# Patient Record
Sex: Female | Born: 1940 | Race: White | Hispanic: No | Marital: Married | Smoking: Never smoker
Health system: Southern US, Community
[De-identification: ages and names within clinical notes are randomized; demographics above are authoritative.]

## PROBLEM LIST (undated history)

## (undated) ENCOUNTER — Telehealth: Attending: Dermatology | Primary: Dermatology

## (undated) ENCOUNTER — Ambulatory Visit: Payer: MEDICARE

## (undated) ENCOUNTER — Encounter

## (undated) ENCOUNTER — Ambulatory Visit

## (undated) ENCOUNTER — Encounter: Attending: MOHS-Micrographic Surgery | Primary: MOHS-Micrographic Surgery

## (undated) ENCOUNTER — Telehealth

## (undated) ENCOUNTER — Encounter: Attending: Dermatology | Primary: Dermatology

## (undated) DIAGNOSIS — E785 Hyperlipidemia, unspecified: Secondary | ICD-10-CM

## (undated) DIAGNOSIS — I5032 Chronic diastolic (congestive) heart failure: Secondary | ICD-10-CM

## (undated) DIAGNOSIS — K449 Diaphragmatic hernia without obstruction or gangrene: Secondary | ICD-10-CM

## (undated) DIAGNOSIS — F329 Major depressive disorder, single episode, unspecified: Secondary | ICD-10-CM

## (undated) DIAGNOSIS — R0602 Shortness of breath: Secondary | ICD-10-CM

## (undated) DIAGNOSIS — N83209 Unspecified ovarian cyst, unspecified side: Secondary | ICD-10-CM

## (undated) DIAGNOSIS — J690 Pneumonitis due to inhalation of food and vomit: Secondary | ICD-10-CM

## (undated) DIAGNOSIS — K219 Gastro-esophageal reflux disease without esophagitis: Secondary | ICD-10-CM

## (undated) DIAGNOSIS — Z87442 Personal history of urinary calculi: Secondary | ICD-10-CM

## (undated) DIAGNOSIS — F32A Depression, unspecified: Secondary | ICD-10-CM

## (undated) DIAGNOSIS — K859 Acute pancreatitis without necrosis or infection, unspecified: Secondary | ICD-10-CM

## (undated) DIAGNOSIS — E169 Disorder of pancreatic internal secretion, unspecified: Secondary | ICD-10-CM

## (undated) DIAGNOSIS — K8020 Calculus of gallbladder without cholecystitis without obstruction: Secondary | ICD-10-CM

## (undated) DIAGNOSIS — I6789 Other cerebrovascular disease: Secondary | ICD-10-CM

## (undated) DIAGNOSIS — I10 Essential (primary) hypertension: Secondary | ICD-10-CM

## (undated) DIAGNOSIS — N200 Calculus of kidney: Secondary | ICD-10-CM

## (undated) DIAGNOSIS — J189 Pneumonia, unspecified organism: Secondary | ICD-10-CM

## (undated) DIAGNOSIS — M1990 Unspecified osteoarthritis, unspecified site: Secondary | ICD-10-CM

## (undated) DIAGNOSIS — J349 Unspecified disorder of nose and nasal sinuses: Secondary | ICD-10-CM

## (undated) DIAGNOSIS — T8859XA Other complications of anesthesia, initial encounter: Secondary | ICD-10-CM

## (undated) DIAGNOSIS — Z9889 Other specified postprocedural states: Secondary | ICD-10-CM

## (undated) DIAGNOSIS — I6501 Occlusion and stenosis of right vertebral artery: Secondary | ICD-10-CM

## (undated) DIAGNOSIS — E079 Disorder of thyroid, unspecified: Secondary | ICD-10-CM

## (undated) DIAGNOSIS — M25569 Pain in unspecified knee: Secondary | ICD-10-CM

## (undated) DIAGNOSIS — E039 Hypothyroidism, unspecified: Secondary | ICD-10-CM

## (undated) DIAGNOSIS — R112 Nausea with vomiting, unspecified: Secondary | ICD-10-CM

## (undated) DIAGNOSIS — R569 Unspecified convulsions: Secondary | ICD-10-CM

## (undated) DIAGNOSIS — M549 Dorsalgia, unspecified: Secondary | ICD-10-CM

## (undated) DIAGNOSIS — D649 Anemia, unspecified: Secondary | ICD-10-CM

## (undated) HISTORY — DX: Calculus of kidney: N20.0

## (undated) HISTORY — PX: CARPAL TUNNEL RELEASE: SHX101

## (undated) HISTORY — PX: NASAL SINUS SURGERY: SHX719

## (undated) HISTORY — DX: Unspecified convulsions: R56.9

## (undated) HISTORY — DX: Acute pancreatitis without necrosis or infection, unspecified: K85.90

## (undated) HISTORY — DX: Hyperlipidemia, unspecified: E78.5

## (undated) HISTORY — DX: Calculus of gallbladder without cholecystitis without obstruction: K80.20

## (undated) HISTORY — DX: Unspecified osteoarthritis, unspecified site: M19.90

## (undated) HISTORY — DX: Essential (primary) hypertension: I10

## (undated) HISTORY — PX: APPENDECTOMY: SHX54

## (undated) HISTORY — DX: Pneumonia, unspecified organism: J18.9

## (undated) HISTORY — DX: Disorder of thyroid, unspecified: E07.9

## (undated) HISTORY — PX: FOOT OSTEOTOMY: SHX957

## (undated) HISTORY — PX: EYE SURGERY: SHX253

## (undated) HISTORY — PX: CATARACT EXTRACTION W/ INTRAOCULAR LENS  IMPLANT, BILATERAL: SHX1307

## (undated) HISTORY — PX: HAND SURGERY: SHX662

## (undated) HISTORY — PX: WRIST GANGLION EXCISION: SUR520

## (undated) HISTORY — PX: ABDOMINAL HYSTERECTOMY: SHX81

---
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
## 1961-12-08 HISTORY — PX: PARTIAL HYSTERECTOMY: SHX80

## 1963-12-09 HISTORY — PX: APPENDECTOMY: SHX54

## 1963-12-09 HISTORY — PX: BILATERAL SALPINGOOPHORECTOMY: SHX1223

## 1999-09-03 ENCOUNTER — Ambulatory Visit (HOSPITAL_BASED_OUTPATIENT_CLINIC_OR_DEPARTMENT_OTHER): Admission: RE | Admit: 1999-09-03 | Discharge: 1999-09-03 | Payer: Self-pay | Admitting: Orthopedic Surgery

## 1999-12-26 ENCOUNTER — Encounter: Admission: RE | Admit: 1999-12-26 | Discharge: 1999-12-26 | Payer: Self-pay | Admitting: Otolaryngology

## 1999-12-26 ENCOUNTER — Encounter: Payer: Self-pay | Admitting: Otolaryngology

## 2000-03-16 ENCOUNTER — Other Ambulatory Visit: Admission: RE | Admit: 2000-03-16 | Discharge: 2000-03-16 | Payer: Self-pay | Admitting: Otolaryngology

## 2001-03-24 ENCOUNTER — Encounter: Admission: RE | Admit: 2001-03-24 | Discharge: 2001-03-24 | Payer: Self-pay | Admitting: Orthopedic Surgery

## 2001-03-24 ENCOUNTER — Encounter: Payer: Self-pay | Admitting: Orthopedic Surgery

## 2001-03-25 ENCOUNTER — Ambulatory Visit (HOSPITAL_BASED_OUTPATIENT_CLINIC_OR_DEPARTMENT_OTHER): Admission: RE | Admit: 2001-03-25 | Discharge: 2001-03-25 | Payer: Self-pay | Admitting: Orthopedic Surgery

## 2001-05-13 ENCOUNTER — Ambulatory Visit (HOSPITAL_BASED_OUTPATIENT_CLINIC_OR_DEPARTMENT_OTHER): Admission: RE | Admit: 2001-05-13 | Discharge: 2001-05-13 | Payer: Self-pay | Admitting: Orthopedic Surgery

## 2002-07-26 ENCOUNTER — Encounter: Payer: Self-pay | Admitting: Otolaryngology

## 2002-07-26 ENCOUNTER — Encounter: Admission: RE | Admit: 2002-07-26 | Discharge: 2002-07-26 | Payer: Self-pay | Admitting: Otolaryngology

## 2003-10-31 ENCOUNTER — Encounter: Admission: RE | Admit: 2003-10-31 | Discharge: 2003-10-31 | Payer: Self-pay | Admitting: Orthopedic Surgery

## 2004-01-05 ENCOUNTER — Encounter: Admission: RE | Admit: 2004-01-05 | Discharge: 2004-01-05 | Payer: Self-pay | Admitting: Otolaryngology

## 2004-02-14 ENCOUNTER — Ambulatory Visit (HOSPITAL_COMMUNITY): Admission: RE | Admit: 2004-02-14 | Discharge: 2004-02-14 | Payer: Self-pay | Admitting: Otolaryngology

## 2004-02-14 ENCOUNTER — Encounter (INDEPENDENT_AMBULATORY_CARE_PROVIDER_SITE_OTHER): Payer: Self-pay | Admitting: Specialist

## 2004-02-14 ENCOUNTER — Ambulatory Visit (HOSPITAL_BASED_OUTPATIENT_CLINIC_OR_DEPARTMENT_OTHER): Admission: RE | Admit: 2004-02-14 | Discharge: 2004-02-14 | Payer: Self-pay | Admitting: Otolaryngology

## 2005-08-27 ENCOUNTER — Ambulatory Visit: Payer: Self-pay | Admitting: Internal Medicine

## 2005-12-14 ENCOUNTER — Emergency Department (HOSPITAL_COMMUNITY): Admission: EM | Admit: 2005-12-14 | Discharge: 2005-12-14 | Payer: Self-pay | Admitting: Emergency Medicine

## 2006-01-19 ENCOUNTER — Ambulatory Visit: Payer: Self-pay | Admitting: Internal Medicine

## 2006-01-27 ENCOUNTER — Ambulatory Visit: Payer: Self-pay | Admitting: Internal Medicine

## 2006-01-29 ENCOUNTER — Ambulatory Visit (HOSPITAL_COMMUNITY): Admission: RE | Admit: 2006-01-29 | Discharge: 2006-01-29 | Payer: Self-pay | Admitting: Internal Medicine

## 2008-10-10 ENCOUNTER — Ambulatory Visit (HOSPITAL_BASED_OUTPATIENT_CLINIC_OR_DEPARTMENT_OTHER): Admission: RE | Admit: 2008-10-10 | Discharge: 2008-10-10 | Payer: Self-pay | Admitting: Orthopedic Surgery

## 2008-10-10 ENCOUNTER — Encounter (INDEPENDENT_AMBULATORY_CARE_PROVIDER_SITE_OTHER): Payer: Self-pay | Admitting: Orthopedic Surgery

## 2009-02-06 ENCOUNTER — Ambulatory Visit (HOSPITAL_BASED_OUTPATIENT_CLINIC_OR_DEPARTMENT_OTHER): Admission: RE | Admit: 2009-02-06 | Discharge: 2009-02-06 | Payer: Self-pay | Admitting: Orthopedic Surgery

## 2010-07-23 ENCOUNTER — Ambulatory Visit: Payer: Self-pay | Admitting: Internal Medicine

## 2010-10-16 ENCOUNTER — Ambulatory Visit (HOSPITAL_BASED_OUTPATIENT_CLINIC_OR_DEPARTMENT_OTHER): Admission: RE | Admit: 2010-10-16 | Discharge: 2010-10-16 | Payer: Self-pay | Admitting: Orthopedic Surgery

## 2010-10-16 ENCOUNTER — Emergency Department (HOSPITAL_COMMUNITY): Admission: EM | Admit: 2010-10-16 | Discharge: 2010-10-16 | Payer: Self-pay | Admitting: Emergency Medicine

## 2010-12-29 ENCOUNTER — Encounter: Payer: Self-pay | Admitting: Otolaryngology

## 2011-01-27 NOTE — Op Note (Signed)
[name], [place] NO.:  [id number]  MEDICAL RECORD NO.:  [id number]          PATIENT TYPE:  AMB  LOCATION:  DSC                          FACILITY:  [facility]  PHYSICIAN:  Cindee Salt, M.D.       DATE OF BIRTH:  1941-07-23  DATE OF PROCEDURE: DATE OF DISCHARGE:                              OPERATIVE REPORT   PREOPERATIVE DIAGNOSIS:  Pain, trapezial arthritis, right thumb.  POSTOPERATIVE DIAGNOSIS:  Pain trapezial arthritis, right thumb.  OPERATION:  Trapezial excision, partial trapezoid resection, abductor pollicis longus transfer, right thumb.  SURGEON:  Cindee Salt, MD  ASSISTANT:  Carolyne Fiscal, RN  ANESTHESIA:  Axillary, general.  ANESTHESIOLOGIST:  Dr. Jean Rosenthal.  HISTORY:  The patient is a 70 year old female with a history of pain in her right hand wrist and thumb.  This has not responded to conservative treatment.  X-rays reveal AP and trapezial arthritic appearance of her right hand.  She has elected to undergo suspensionplasty using APL with partial resection of the trapezoid.  She is aware of risks and complications including infection, recurrence, injury to arteries, nerves, and tendons, incomplete relief of symptoms, and dystrophy.  In the preoperative area, the patient is seen.  The extremity marked by both the patient and surgeon.  Antibiotic given.  PROCEDURE:  The patient was brought to the operating room where axillary block was carried out without difficulty.  A general anesthetic was also given.  She was prepped using ChloraPrep, supine position, right arm free.  A 3-minute dry time was allowed.  Time-out taken confirming the patient and procedure.  After adequate anesthesia was afforded, the limb was exsanguinated with an Esmarch bandage.  Tourniquet placed high on the arm was inflated to 250 mmHg.  A curvilinear incision was made over the radial aspect of the hand at base of the thumb, carried down through subcutaneous tissue.  Bleeders were  electrocauterized with bipolar. Radial nerve was identified and protected.  Dissection was carried down to the abductor pollicis longus and extensor pollicis brevis.  The radial artery was identified and protected.  The capsule was then opened to the [facility] STT joint.  A very significant synovitis of the STT joint was immediately encountered with a large amount of fluid which was evacuated.  The trapezium was then resected after cutting this to half with an osteotome.  This allowed visualization of the trapezial trapezoid joint.  The remainder of the trapezium was removed.  There was no cartilage present on the STT joint.  Minimal cartilage with large erosion was present on the distal articular surface of the trapezium. The specimen was sent to pathology.  The proximal aspect of the trapezoid was then resected using a rongeur.  X-rays confirmed adequate resection.  The most dorsal aspect of the abductor pollicis longus tendon was then harvested.  A separate incision made at the musculotendinous junction.  A Carroll tendon retriever was passed through the first dorsal compartment.  The portion of the APL was then harvested using a 32-gauge monofilament wire as a cheese cutter bringing this back to the proximal musculotendinous junction where the  tendon was transected.  Drill holes were then placed through the base of the thumb metacarpal and base of the index metacarpal.  This allowed the APL tendon to be transferred.  After irrigation and debridement of the joint, this was transferred first from a distal to proximal direction through the thumb metacarpal and then through the index from the radial to ulnar side.  A small incision was made to the ulnar side of the second metacarpal, allowing visualization.  A hemostat was then passed subperiosteally through the extensor carpi radialis longus and the tendon brought to the [facility] area of the thumb through the capsule at the juncture of the  trapezoid first metacarpal.  This was done taking care to protect the radial artery.  This was passed volar to it.  The tendon was then brought back through the drill hole in the base of the thumb metacarpal clamped.  The passage was then sutured with 4-0 FiberWire as it passed through the tunnel suturing the 2 limbs of the APL together at the proximal aspect of the thumb metacarpal.  This was then tied or sutured to the capsule dorsally at the carpometacarpal joint.  It was also sutured to the periosteum as it exited through the second metacarpal.  The wounds were copiously irrigated with saline.  The capsule was then closed and repaired with figure of eight 4-0 Vicryl sutures taking care to protect the radial artery.  The skin was then repaired with interrupted 4-0 Vicryl Rapide sutures.  X-rays confirmed the thumb adequately suspended from the index.  A sterile compressive dressing and thumb spica dorsal palmar splint applied.  On deflation of the tourniquet, all fingers immediately pinked and she was taken to the recovery room for observation in satisfactory condition.  She will be admitted for overnight stay and discharged when she is comfortable.  She will return in 1 week on Percocet.          ______________________________ Cindee Salt, M.D.     GK/MEDQ  D:  10/16/2010  T:  10/17/2010  Job:  [id number]  cc:   Dr. [name]  Electronically Signed by Cindee Salt M.D. on 01/27/2011 12:22:06 PM

## 2011-02-18 LAB — BASIC METABOLIC PANEL
BUN: 8 mg/dL (ref 6–23)
CO2: 28 mEq/L (ref 19–32)
Calcium: 10 mg/dL (ref 8.4–10.5)
Chloride: 104 mEq/L (ref 96–112)
Chloride: 106 mEq/L (ref 96–112)
GFR calc Af Amer: >60 mL/min (ref >60)
GFR calc Af Amer: >60 mL/min (ref >60)
GFR calc non Af Amer: 57 mL/min — ABNORMAL LOW (ref >60)
Glucose, Bld: 99 mg/dL (ref 70–99)

## 2011-02-18 LAB — DIFFERENTIAL
Basophils Absolute: 0 10*3/uL (ref 0.0–0.1)
Lymphocytes Relative: 7 % — ABNORMAL LOW (ref 12–46)
Lymphs Abs: 0.8 10*3/uL (ref 0.7–4.0)
Monocytes Absolute: 0.1 10*3/uL (ref 0.1–1.0)
Monocytes Relative: 1 % — ABNORMAL LOW (ref 3–12)
Neutro Abs: 9.9 10*3/uL — ABNORMAL HIGH (ref 1.7–7.7)
Neutrophils Relative %: 91 % — ABNORMAL HIGH (ref 43–77)

## 2011-02-18 LAB — CBC
MCH: 30.1 pg (ref 26.0–34.0)
MCHC: 34.4 g/dL (ref 30.0–36.0)
MCV: 87.4 fL (ref 78.0–100.0)
RDW: 15.8 % — ABNORMAL HIGH (ref 11.5–15.5)

## 2011-02-18 LAB — RAPID URINE DRUG SCREEN, HOSP PERFORMED
Barbiturates: NOT DETECTED
Benzodiazepines: POSITIVE — AB

## 2011-02-18 LAB — POCT HEMOGLOBIN-HEMACUE: Hemoglobin: 14.2 g/dL (ref 12.0–15.0)

## 2011-03-20 LAB — BASIC METABOLIC PANEL
Creatinine, Ser: 0.94 mg/dL (ref 0.4–1.2)
Potassium: 4.4 mEq/L (ref 3.5–5.1)
Sodium: 141 mEq/L (ref 135–145)

## 2011-04-11 ENCOUNTER — Emergency Department (HOSPITAL_COMMUNITY)
Admission: EM | Admit: 2011-04-11 | Discharge: 2011-04-11 | Disposition: A | Discharge status: Home / Self Care | Payer: Medicare Other | Attending: Emergency Medicine | Admitting: Emergency Medicine

## 2011-04-11 ENCOUNTER — Emergency Department (HOSPITAL_COMMUNITY): Payer: Medicare Other

## 2011-04-11 DIAGNOSIS — M545 Low back pain, unspecified: Secondary | ICD-10-CM | POA: Insufficient documentation

## 2011-04-11 DIAGNOSIS — M5380 Other specified dorsopathies, site unspecified: Secondary | ICD-10-CM | POA: Insufficient documentation

## 2011-04-11 DIAGNOSIS — M79609 Pain in unspecified limb: Secondary | ICD-10-CM | POA: Insufficient documentation

## 2011-04-11 LAB — URINALYSIS, ROUTINE W REFLEX MICROSCOPIC
Glucose, UA: NEGATIVE mg/dL
Ketones, ur: NEGATIVE mg/dL
Nitrite: NEGATIVE
Protein, ur: NEGATIVE mg/dL
pH: 5.5 (ref 5.0–8.0)

## 2011-04-22 NOTE — Op Note (Signed)
NAMEMACARENA, [name] NO.:  [id number]   MEDICAL RECORD NO.:  [id number]          PATIENT TYPE:  AMB   LOCATION:  DSC                          FACILITY:  [facility]   PHYSICIAN:  Cindee Salt, M.D.       DATE OF BIRTH:  29-Jun-1941   DATE OF PROCEDURE:  10/10/2008  DATE OF DISCHARGE:                               OPERATIVE REPORT   PREOPERATIVE DIAGNOSES:  1. Stenosing tenosynovitis, right thumb; stenosing tenosynovitis,      right middle finger.  2. Flexor sheath cyst, right middle finger.   POSTOPERATIVE DIAGNOSES:  1. Stenosing tenosynovitis, right thumb; stenosing tenosynovitis,      right middle finger.  2. Flexor sheath cyst, right middle finger.   OPERATIONS:  1. Release A1 pulley, right thumb; release A1 pulley, right middle      finger.  2. Excision flexor sheath cyst, right middle finger.   SURGEON:  Cindee Salt, MD   ASSISTANT:  Carolyne Fiscal, RN   ANESTHESIA:  Forearm-based IV regional.   ANESTHESIOLOGIST:  Burna Forts, MD   HISTORY:  The patient is a 70 year old female with a history of a mass  on the volar aspect of the A1 pulley of her right middle finger.  She  also shows triggering of the right middle and ring finger.  This has not  responded to conservative treatment.  She has elected to undergo  surgical decompression of each of the flexor sheath A1 pulley with  excision of the cyst on the middle finger.  She is aware of risks and  complications including infection; recurrence; injury to arteries,  nerves, and tendons; incomplete relief of symptoms; and dystrophy.  In  the preoperative area, the patient is seen, the extremity marked by both  the patient and surgeon, antibiotic given.   PROCEDURE:  The patient was brought to the operating room where a  forearm IV regional anesthetic was carried out without difficulty.  She  was prepped using DuraPrep in supine position, right arm free.  A time-  out was taken after identification of the areas  of incision.  Adequate  anesthesia performed.  A transverse incision was made over the A1 pulley  of the right thumb, carried down through subcutaneous tissue.  Bleeders  were electrocauterized with bipolar.  Neurovascular bundles were  identified and protected.  An incision was then made on the radial  aspect of the A1 pulley taking care to protect the oblique pulley.  Thumb was placed through a full range motion, no further triggering was  evident.  Wound was irrigated and skin closed with interrupted 5-0  Vicryl Rapide suture.  A separate oblique incision was made over the A1  pulley of the right middle finger, carried down through subcutaneous  tissue.  Neurovascular structures again protected.  Bleeders  electrocauterized with bipolar.  The A1 pulley was then released in its  radial aspect.  A small incision was made centrally in the A2 pulley.  Three distinct cysts were noted, these were each excised.  The large  wound was sent to pathology.  No further lesions were identified.  Neurovascular structures were protected throughout the procedure.  Wound  was copiously irrigated with saline and the skin closed with interrupted  5-0 Vicryl Rapide sutures.  A sterile compressive dressing was applied  with fingers free.  No splint  was attached.  The patient was taken to the recovery room for  observation in satisfactory condition.  At deflation of the tourniquet,  all fingers immediately pinked.  She will be discharged home, to return  to the [facility] of [place] in 1 week, on Vicodin.           ______________________________  Cindee Salt, M.D.     GK/MEDQ  D:  10/10/2008  T:  10/10/2008  Job:  [id number]   cc:   Leotis Shames D. Judithann Sheen, MD

## 2011-04-22 NOTE — Op Note (Signed)
NAMESANYE, [name] NO.:  [id number]   MEDICAL RECORD NO.:  [id number]          PATIENT TYPE:  AMB   LOCATION:  DSC                          FACILITY:  [facility]   PHYSICIAN:  Cindee Salt, M.D.       DATE OF BIRTH:  Apr 02, 1941   DATE OF PROCEDURE:  02/06/2009  DATE OF DISCHARGE:                               OPERATIVE REPORT   PREOPERATIVE DIAGNOSES:  1. Carpal tunnel syndrome left hand.  2. Carpometacarpal arthritis, right thumb.   POSTOPERATIVE DIAGNOSES:  1. Carpal tunnel syndrome left hand.  2. Carpometacarpal arthritis, right thumb.   OPERATION:  1. Decompression, left median nerve.  2. Injection of carpometacarpal joint, right thumb with Celestone and      Xylocaine.   SURGEON:  Cindee Salt, MD   ANESTHESIA:  Forearm based IV regional with sedation and local  infiltration with 0.25% Marcaine to the carpal tunnel.   ANESTHESIOLOGIST:  Sheldon Silvan, MD   HISTORY:  The patient is a 70 year old female with a history of carpal  tunnel syndrome, EMG nerve conductions positive which has not responded  to conservative treatment.  She also has degenerative arthritis,  carpometacarpal joint of her right thumb.  She is desirous of release of  the left carpal tunnel.  She is aware of risks and complications  including infection, recurrence, injury to arteries, nerves, tendons,  incomplete relief of symptoms, and dystrophy.  In the preoperative area,  the patient is seen, the extremity marked by both the patient and  surgeon, and antibiotic given.   PROCEDURE IN DETAIL:  The patient was brought to the operating room  where a forearm based IV regional anesthetic was carried out without  difficulty.  She was prepped using DuraPrep, supine position, left arm  free.  A time-out was taken confirming the patient and procedure.  A  longitudinal incision was made in the palm and carried down through  subcutaneous tissue.  Bleeders were electrocauterized.  Palmar fascia  was split.  Superficial palmar arch identified.  Flexor tendon of the  ring and little finger identified.  To the ulnar side of median nerve,  carpal retinaculum was incised with sharp dissection.  Right angle and  Sewall retractor were placed between the skin and forearm fascia.  The  fascia was released for approximately 1.5 cm proximal to the wrist  crease under direct vision.  Canal was explored.  Area of compression to  the nerve was apparent with an hourglass deformity.  No further lesions  were identified.  Wound was irrigated.  Skin closed with interrupted 5-0  Vicryl Rapide sutures.  A local infiltration with 0.25% Marcaine without  epinephrine was then given.  A sterile compressive dressing was applied.  The right thumb carpometacarpal joint was then prepped.  A injection to  the carpometacarpal joint was then given with 1% Xylocaine without  epinephrine and 0.5 mL of Celestone without complication.  A splint was  applied to the  left hand dressing.  The patient tolerated the procedure well and was  taken to the recovery room  for observation in satisfactory condition.  She will be discharged to home to return to the [facility] of  [place] in 1 week on Vicodin.           ______________________________  Cindee Salt, M.D.     GK/MEDQ  D:  02/06/2009  T:  02/06/2009  Job:  [id number]   cc:   Dr. Judithann Sheen

## 2011-04-25 NOTE — H&P (Signed)
[name], [name]                              ACCOUNT NO.:  [id number]   MEDICAL RECORD NO.:  [id number]                   PATIENT TYPE:  AMB   LOCATION:  DSC                                  FACILITY:  [facility]   PHYSICIAN:  Hermelinda Medicus, M.D.                DATE OF BIRTH:  08-22-1941   DATE OF ADMISSION:  02/14/2004  DATE OF DISCHARGE:                                HISTORY & PHYSICAL   This patient is a 70 year old female who, in the early 1990s, had severe  exposure to hydrocarbon chemical leak in [place] and had a considerable  amount of benign inflammatory tissue within her nose when I first saw her in  the late 1990s.  She had undergone sinus surgery in [place] which was  essentially endoscopic sinus surgery and gained some result.  I operated on  her in 1998 where we did bilateral maxillary sinus ostial enlargement and  right ethmoidectomy with turbinate reduction as she had considerable nasal  congestion, persistent sinusitis. She was taken care of by Dr. Shayne Alken  for her allergies to keep her respiratory status under control, and she did  reasonably well.  She, however, developed further sinus problems and allergy  problems, and in 2001 we did again surgery where we removed nasal polyps,  granulation tissue, and reduced her turbinates once again but did not remove  any mucous membrane.  She is still under Dr. Hortencia Pilar care but has now  discontinued this, and her sinus problems are recurring.  She is under the  care of the physicians in [place].  I have encouraged her to get back to  her allergy shots.  She is also under the care of Dr. Mechele Collin for reflux  esophagitis.  She has also had a complete cardiovascular evaluation.   She had a recent CT scan as she has had further aggravation of her sinuses  with drainage down the back of her throat with aggravation of her asthma  problems and has been seen under my care in 2004 alone.  Has been on  doxycycline.  She has  been on the decongestants of Prolix PD.  She does hae  a blood pressure problem, so we cannot use strong decongestants, and she has  been treated also with Nexium for her reflux esophagitis as we felt this may  be aggravating her respiratory problems.  This has improved somewhat, but  she continues to need antibiotics.  She continues to have nasal congestion  and recurrent polypoid change in the face of antibiotics, decongestants, and  steroid nasal sprays.  Her medications have been considerable which will be  listed in the following part of this dictation.  She has used primarily  Flonase, Clarinex, Singulair, and Accolate.   She has had recently a CT scan done on January 05, 2004, showing bilateral  ethmoid sinusitis with a left maxillary sinusitis with turbinate  hypertrophy.  She has mild thickening in the inferior sphenoid sinuses, but  they are relatively clear.  She now enters for bilateral frontal  ethmoidectomy revision with a left maxillary sinus ostial enlargement  revision with a reduction of turbinates.   ALLERGIES:  SULFA.   MEDICATIONS:  1. Premarin 1.25 daily.  2. Micardis HCT.  3. Nexium 40 mg.  4. Vitorin.  5. Proventil inhaler p.r.n.  6. Flonase nasal spray.  7. Accolate.  8. Clarinex.  9. Singulair.   SOCIAL HISTORY:  She does not smoke and does not drink.   PAST SURGICAL HISTORY:  1. She has had sinus surgery including mine and in [place] x 5.  2  She has had bilateral foot surgery.  1. Hysterectomy.   PAST MEDICAL HISTORY:  1. Hypertension.  Her blood pressure at this point is 112/66.  It is well     controlled.  2. Her asthma is well controlled.  3. She does have a history of headache and some dizziness which she     associates primarily with sinusitis.  4. She has somewhat of arthritic changes   She is generally in excellent health and has an excellent attitude with this  long-term chemical exposure problem.   PHYSICAL EXAMINATION:  VITAL  SIGNS: She weighs 115 pounds.  She is 5 feet  tall.  Heart rate 90, respirations 22, SAO2 98%.  Blood pressure 112/66.  Hemoglobin 12.8.  HEENT AND NECK:  Her ears are clear.  Tympanic membranes are clear.  Her  nose is very congested.  Turbinate hypertrophy with evidence of polypoid  change with very poor nasal airway.  She has been on antibiotics recently  and has had considerable drainage down the back of her throat, though her  throat looks quite excellent today.  No evidence of any ulceration or mass.  True cords, false cords, epiglottic space, tongue are clear.  True cord  mobility, gag reflex, tongue mobility, EOMs, facial nerves, shoulder  strength are all symmetrical, and her heck is free of any thyromegaly,  cervical adenopathy, or mass.  CHEST:  Clear.  No rales, rhonchi, or wheezes.  CARDIOVASCULAR;  No heaves, murmurs, or gallops.  ABDOMEN:  Unremarkable.  EXTREMITIES:  Unremarkable.  Above noted surgeries.   INITIAL DIAGNOSIS:  1. Severe chemical exposure in the early 1990s in [place], hydrocarbon, with     persistent repetitive sinusitis, bronchitis associated with asthma, with     polypoid changes.  2. History of reflux esophagitis.  3. History of allergic rhinitis.   PLAN:  Bilateral frontal ethmoidectomy and a left maxillary sinus ostial  enlargement revision with a turbinate reduction.                                                Hermelinda Medicus, M.D.    JC/MEDQ  D:  02/14/2004  T:  02/14/2004  Job:  [id number]   cc:   Aram Beecham, M.D.  [address]  [place], [place] [address]

## 2011-04-25 NOTE — Op Note (Signed)
[place]. [facility]  Patient:    [name], [name]                           MRN: [id number] Proc. Date: 05/13/01 Adm. Date:  [id number] Disc. Date: [id number] Attending:  Aldean Baker V                           Operative Report  PREOPERATIVE DIAGNOSIS:  Moderate hallux valgus deformity, left great toe with 30 degrees hallux valgus angle and 15 degrees intermetatarsal angle.  POSTOPERATIVE DIAGNOSIS:  Moderate hallux valgus deformity, left great toe with 30 degrees hallux valgus angle and 15 degrees intermetatarsal angle.  OPERATION PERFORMED:  Left chevron osteotomy.  SURGEON:  Nadara Mustard, M.D.  ANESTHESIA:  LMA plus ankle block.  ESTIMATED BLOOD LOSS:  Minimal.  ANTIBIOTICS:  1 gm of Kefzol.  TOURNIQUET TIME:  Esmarch at the ankle for approximately 30 minutes.  DISPOSITION:  To PACU in stable condition.  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  The patient has failed conservative care.  She has undergone chevron osteotomy for the right foot, has had good improvement in her symptoms, has been able to ambulate with regular shoe wear and presents at this time for surgical intervention for her left great toe. The risks and benefits were discussed including infection, neurovascular injury, persistent pain, fracture of the bone, need for additional surgery, inability to wear the normal shoe wear which she wishes to wear.  The patient states that she understands and wishes to proceed at this time.  DESCRIPTION OF PROCEDURE:  The patient was brought to operating room 5 after undergoing an ankle block.  After an adequate level of anesthesia was obtained, the patients left lower extremity was prepped using DuraPrep and draped into a sterile field.  With pinching the skin, the patient still had some pain sensation.  She therefore underwent a LMA general anesthetic and the procedure was  initiated.  The Esmarch was wrapped around the ankle with elevation for exsanguination.  An incision was made over the medial border of the left great toe.  Retractors were used to hold back the flaps.  Careful attention was paid not to dissect past midline of the bone.  The patient had a large bony prominence and this cheilectomy was performed to remove the bony prominence.  The chevron osteotomy was then performed.  Osteotomes were not used to displace the osteotomy.  Osteotomy was then displaced with the great toe and metatarsal head being displaced laterally approximately 4 mm.  This was then held stabilized with a 0.0625 K-wire.  The wound was irrigated with normal saline.  The capsule was closed using running 2-0 Vicryl.  The skin was closed using a vertical mattress with 2-0 nylon.  The wound was covered with Adaptic orthopedic sponges, sterile Webril and a loosely wrapped Coban.  The Esmarch was released.  The patient had good capillary refill.  She was extubated and taken to PACU in stable condition.  Plan for 23 hour observation.  Discharge by anesthesia in the morning, follow up in the office in two weeks. DD:  05/13/01 TD:  05/14/01 Job: [id number]

## 2011-04-25 NOTE — Op Note (Signed)
[place]. [facility]  Patient:    [name], [name]                           MRN: [id number] Proc. Date: 03/25/01 Adm. Date:  [id number] Attending:  Aldean Baker V                           Operative Report  PREOPERATIVE DIAGNOSIS:  Hallux valgus deformity bunion, right great toe.  POSTOPERATIVE DIAGNOSIS:  Hallus valgus deformity bunion, right great toe.  PROCEDURE:  Chevron osteotomy with stabilization with 0.0625 K-wire.  SURGEON:  Nadara Mustard, M.D.  ANESTHESIA:  Ankle block.  ESTIMATED BLOOD LOSS:  Minimal.  ANTIBIOTICS:  1 g Kefzol.  TOURNIQUET TIME:  Esmarch at the ankle for approximately 30 minutes.  DISPOSITION:  To PACU in stable condition.  INDICATION FOR PROCEDURE:  Patient is a 70 year old woman with a hallus valgus angle of 40 degrees and an intermetatarsal angle of 15 degrees, who presents at this time for a chevron osteotomy.  The risks and benefits of surgery were discussed, including infection, neurovascular injury, persistent pain, stiffness, recurrence of the deformity, unable to wear her shoewear.  The patient states she understands and wishes to proceed at this time.  DESCRIPTION OF PROCEDURE:  The patient was brought to outpatient room 6 after undergoing an ankle block.  After adequate level of anesthesia obtained, the patients right lower extremity was prepped using Duraprep and draped into a sterile field.  The patient received 1 g of Kefzol preoperatively.  A longitudinal incision was made over the medial border of the right great toe. The capsule was incised, and an elliptical section was excised from the plantar aspect of the capsule.  An ostectomy was performed of the lateral bump of the great toe metatarsal head.  A chevron osteotomy had been performed. The metatarsal head was shifted laterally approximately 4 mm.  This was stabilized with a 0.0625 K-wire.  The wound was irrigated.  The toe was held adducted, and the  capsule was closed using 2-0 Vicryl.  The skin was closed using a vertical mattress.  The toe was held in adduction with a soft dressing.  The patient was taken to the PACU in stable condition, discharged to home in stable condition, nonweightbearing on the right, postop wooden shoe, follow-up in the office in one week for change of dressing. DD:  03/25/01 TD:  03/26/01 Job: [id number]

## 2011-04-25 NOTE — Op Note (Signed)
[name], [name]                              ACCOUNT NO.:  [id number]   MEDICAL RECORD NO.:  [id number]                   PATIENT TYPE:  AMB   LOCATION:  DSC                                  FACILITY:  [facility]   PHYSICIAN:  Hermelinda Medicus, M.D.                DATE OF BIRTH:  09-08-1941   DATE OF PROCEDURE:  02/14/2004  DATE OF DISCHARGE:                                 OPERATIVE REPORT   PREOPERATIVE DIAGNOSIS:  Bilateral ethmoid frontal sinusitis with left  maxillary sinusitis, history of sinus surgery, history of hydrocarbon  chemical exposure.   POSTOPERATIVE DIAGNOSIS:  Bilateral ethmoid frontal sinusitis with left  maxillary sinusitis, history of sinus surgery, history of hydrocarbon  chemical exposure.   OPERATION:  Functional endoscopic sinus surgery, bilateral frontal  ethmoidectomy with left maxillary sinus ostial enlargement with removal of  mucoid material and polyps.   SURGEON:  Hermelinda Medicus, M.D.   ANESTHESIA:  General endotracheal with Dr. Gelene Mink with local supplement  1% Xylocaine with epinephrine, 3 mL, and topical cocaine 200 mg.   PROCEDURE:  The patient was placed in the supine position.  Under general  endotracheal anesthesia, the nose was prepped and draped in the usual manner  using Hibiclens and the usual head drape.  The nose was anesthetized with  the above medications and the inferior turbinates were aggressively  outfractured but no mucous membrane was removed.  They had been previously  reduced.  The middle turbinates were found to be really quite small in this  very narrow nose and we were able to remove some polyps around the middle  turbinates and the entrance to the ethmoid sinuses and then we reduced those  by pushing them medial.  The ethmoid sinus on the left was first approached  and the mucopolypoid scar tissue was removed in an entrance to the ethmoid  sinus.  The more posterior aspect of the ethmoid sinus was in a slightly  better  condition.  A superoanterior aspect was concentrated on to make sure  all the mucous membrane was removed from the frontal duct region but not to  be too aggressive to strip that membrane causing further scar tissue.  The  sinus was suctioned and found to be intact and without excessive bleeding.  The 0 and 70 degree scopes were used, the straight and upbiting [employer] were used to complete this procedure.  The left maxillary sinus was  then also noted, the natural ostium was filled with polypoid material, and  the sinus was filled with thick, mucoid debris.  The natural ostium was  found, the polyps were removed.  The side biting forceps was used to  increase the size approximately 5 times its normal size.  Once again, the  polypoid material could be seen quite extensively and this was removed using  the 0 degree scope and the angled [employer]  and side biting forceps.  The right ethmoid sinus was then approached as well as the frontal using the  0 and 70 degree scope.  The middle turbinate was again pushed as medial as  possible and flattened as much as possible and mucous membrane, polypoid  debris, and polypoid changed membrane was again removed.  The membrane  within the sinus is somewhat scarred, fibrotic type  material.  This was  removed and then the frontal sinus ostium was again checked, found to be  quite small but yet once we removed the obstructing portion in the ethmoid,  the frontal looked to be in much more optimistic condition.  Again, the  nasal frontal duct area was not disturbed in fear of causing more scar  tissue in this area.  The polypoid debris was removed from that area.  The  suction was used to suction this area.  The ceiling of the ethmoid sinus was  intact.  The blood loss here was approximately no more than 20 mL and then  once this was  completed, the Gelfoam was placed within each ethmoid sinus, the Gelfilm was  used to hold the middle  turbinates medial, and Merocel pack was used to  minimize any swelling.  The patient was taken to the recovery room in good  condition.  Follow up will be tomorrow, then in one week, three weeks, six  weeks, six months, and a year.                                               Hermelinda Medicus, M.D.    JC/MEDQ  D:  02/14/2004  T:  02/14/2004  Job:  [id number]   cc:   Osborne Oman, M.D.  [address]  [place], [place] [address]

## 2011-08-28 ENCOUNTER — Ambulatory Visit: Payer: Self-pay | Admitting: Internal Medicine

## 2011-09-09 LAB — BASIC METABOLIC PANEL
BUN: 7
CO2: 29
GFR calc Af Amer: >60
GFR calc non Af Amer: 60 — ABNORMAL LOW
Glucose, Bld: 86
Sodium: 142

## 2011-10-21 ENCOUNTER — Other Ambulatory Visit: Payer: Self-pay | Admitting: Orthopedic Surgery

## 2011-11-17 ENCOUNTER — Other Ambulatory Visit: Payer: Self-pay | Admitting: Pediatrics

## 2011-11-17 ENCOUNTER — Ambulatory Visit
Admission: RE | Admit: 2011-11-17 | Discharge: 2011-11-17 | Disposition: A | Discharge status: Home / Self Care | Payer: Medicare Other | Source: Ambulatory Visit | Attending: Pediatrics | Admitting: Pediatrics

## 2011-11-17 DIAGNOSIS — J40 Bronchitis, not specified as acute or chronic: Secondary | ICD-10-CM

## 2011-11-17 DIAGNOSIS — R05 Cough: Secondary | ICD-10-CM

## 2011-11-18 ENCOUNTER — Encounter (HOSPITAL_BASED_OUTPATIENT_CLINIC_OR_DEPARTMENT_OTHER): Payer: Self-pay | Admitting: *Deleted

## 2011-11-19 ENCOUNTER — Ambulatory Visit (HOSPITAL_BASED_OUTPATIENT_CLINIC_OR_DEPARTMENT_OTHER): Payer: Medicare Other | Admitting: Certified Registered Nurse Anesthetist

## 2011-11-19 ENCOUNTER — Encounter (HOSPITAL_BASED_OUTPATIENT_CLINIC_OR_DEPARTMENT_OTHER)
Admission: RE | Disposition: A | Discharge status: Home / Self Care | Payer: Self-pay | Source: Ambulatory Visit | Attending: Orthopedic Surgery

## 2011-11-19 ENCOUNTER — Encounter (HOSPITAL_BASED_OUTPATIENT_CLINIC_OR_DEPARTMENT_OTHER): Payer: Self-pay | Admitting: Certified Registered Nurse Anesthetist

## 2011-11-19 ENCOUNTER — Encounter (HOSPITAL_BASED_OUTPATIENT_CLINIC_OR_DEPARTMENT_OTHER): Payer: Self-pay | Admitting: Orthopedic Surgery

## 2011-11-19 ENCOUNTER — Ambulatory Visit (HOSPITAL_BASED_OUTPATIENT_CLINIC_OR_DEPARTMENT_OTHER)
Admission: RE | Admit: 2011-11-19 | Discharge: 2011-11-19 | Disposition: A | Discharge status: Home / Self Care | Payer: Medicare Other | Source: Ambulatory Visit | Attending: Orthopedic Surgery | Admitting: Orthopedic Surgery

## 2011-11-19 ENCOUNTER — Encounter (HOSPITAL_BASED_OUTPATIENT_CLINIC_OR_DEPARTMENT_OTHER): Payer: Self-pay | Admitting: *Deleted

## 2011-11-19 DIAGNOSIS — M65839 Other synovitis and tenosynovitis, unspecified forearm: Secondary | ICD-10-CM | POA: Insufficient documentation

## 2011-11-19 DIAGNOSIS — R0602 Shortness of breath: Secondary | ICD-10-CM | POA: Insufficient documentation

## 2011-11-19 DIAGNOSIS — M6740 Ganglion, unspecified site: Secondary | ICD-10-CM | POA: Insufficient documentation

## 2011-11-19 DIAGNOSIS — Z01812 Encounter for preprocedural laboratory examination: Secondary | ICD-10-CM | POA: Insufficient documentation

## 2011-11-19 DIAGNOSIS — J45909 Unspecified asthma, uncomplicated: Secondary | ICD-10-CM | POA: Insufficient documentation

## 2011-11-19 HISTORY — DX: Major depressive disorder, single episode, unspecified: F32.9

## 2011-11-19 HISTORY — DX: Disorder of pancreatic internal secretion, unspecified: E16.9

## 2011-11-19 HISTORY — PX: TRIGGER FINGER RELEASE: SHX641

## 2011-11-19 HISTORY — DX: Pain in unspecified knee: M25.569

## 2011-11-19 HISTORY — DX: Gastro-esophageal reflux disease without esophagitis: K21.9

## 2011-11-19 HISTORY — DX: Depression, unspecified: F32.A

## 2011-11-19 HISTORY — DX: Dorsalgia, unspecified: M54.9

## 2011-11-19 HISTORY — DX: Unspecified disorder of nose and nasal sinuses: J34.9

## 2011-11-19 HISTORY — DX: Diaphragmatic hernia without obstruction or gangrene: K44.9

## 2011-11-19 HISTORY — DX: Shortness of breath: R06.02

## 2011-11-19 LAB — POCT HEMOGLOBIN-HEMACUE: Hemoglobin: 12 g/dL (ref 12.0–15.0)

## 2011-11-19 SURGERY — RELEASE, A1 PULLEY, FOR TRIGGER FINGER
Anesthesia: Monitor Anesthesia Care | Site: Hand | Laterality: Right

## 2011-11-19 MED ORDER — HYDROCODONE-ACETAMINOPHEN 5-500 MG PO TABS: 1.0000 | ORAL_TABLET | Freq: Four times a day (QID) | ORAL | Status: AC | PRN

## 2011-11-19 MED ORDER — MORPHINE SULFATE 2 MG/ML IJ SOLN: 0.0500 mg/kg | INTRAMUSCULAR | Status: DC | PRN

## 2011-11-19 MED ORDER — MIDAZOLAM HCL 2 MG/2ML IJ SOLN: 0.5000 mg | INTRAMUSCULAR | Status: DC | PRN

## 2011-11-19 MED ORDER — CHLORHEXIDINE GLUCONATE 4 % EX LIQD: 60.0000 mL | Freq: Once | CUTANEOUS | Status: DC

## 2011-11-19 MED ORDER — PROPOFOL 10 MG/ML IV EMUL
INTRAVENOUS | Status: DC | PRN
  Administered 2011-11-19: 100 ug/kg/min via INTRAVENOUS

## 2011-11-19 MED ORDER — LACTATED RINGERS IV SOLN: INTRAVENOUS | Status: DC

## 2011-11-19 MED ORDER — ONDANSETRON HCL 4 MG/2ML IJ SOLN
INTRAMUSCULAR | Status: DC | PRN
  Administered 2011-11-19: 4 mg via INTRAVENOUS

## 2011-11-19 MED ORDER — BUPIVACAINE HCL (PF) 0.25 % IJ SOLN
INTRAMUSCULAR | Status: DC | PRN
  Administered 2011-11-19: 4 mL

## 2011-11-19 MED ORDER — LACTATED RINGERS IV SOLN
INTRAVENOUS | Status: DC
  Administered 2011-11-19: 08:00:00 via INTRAVENOUS

## 2011-11-19 MED ORDER — CEFAZOLIN SODIUM 1-5 GM-% IV SOLN
1.0000 g | INTRAVENOUS | Status: AC
  Administered 2011-11-19: 1 g via INTRAVENOUS

## 2011-11-19 MED ORDER — LIDOCAINE HCL (CARDIAC) 20 MG/ML IV SOLN
INTRAVENOUS | Status: DC | PRN
  Administered 2011-11-19: 40 mg via INTRAVENOUS

## 2011-11-19 MED ORDER — LIDOCAINE HCL (PF) 0.5 % IJ SOLN
INTRAMUSCULAR | Status: DC | PRN
  Administered 2011-11-19: 30 mL

## 2011-11-19 MED ORDER — MIDAZOLAM HCL 5 MG/5ML IJ SOLN
INTRAMUSCULAR | Status: DC | PRN
  Administered 2011-11-19 (×3): 1 mg via INTRAVENOUS

## 2011-11-19 MED ORDER — FENTANYL CITRATE 0.05 MG/ML IJ SOLN: 50.0000 ug | INTRAMUSCULAR | Status: DC | PRN

## 2011-11-19 MED ORDER — CEFAZOLIN SODIUM 1-5 GM-% IV SOLN: 1.0000 g | INTRAVENOUS | Status: DC

## 2011-11-19 MED ORDER — METOCLOPRAMIDE HCL 5 MG/ML IJ SOLN: 10.0000 mg | Freq: Once | INTRAMUSCULAR | Status: DC | PRN

## 2011-11-19 MED ORDER — FENTANYL CITRATE 0.05 MG/ML IJ SOLN
INTRAMUSCULAR | Status: DC | PRN
  Administered 2011-11-19: 50 ug via INTRAVENOUS

## 2011-11-19 MED ORDER — FENTANYL CITRATE 0.05 MG/ML IJ SOLN: 25.0000 ug | INTRAMUSCULAR | Status: DC | PRN

## 2011-11-19 SURGICAL SUPPLY — 36 items
BANDAGE COBAN STERILE 2 (GAUZE/BANDAGES/DRESSINGS) ×2 IMPLANT
BLADE SURG 15 STRL LF DISP TIS (BLADE) ×1 IMPLANT
BLADE SURG 15 STRL SS (BLADE) ×2
BNDG CMPR 9X4 STRL LF SNTH (GAUZE/BANDAGES/DRESSINGS)
BNDG ESMARK 4X9 LF (GAUZE/BANDAGES/DRESSINGS) IMPLANT
CHLORAPREP W/TINT 26ML (MISCELLANEOUS) ×2 IMPLANT
CLOTH BEACON ORANGE TIMEOUT ST (SAFETY) ×2 IMPLANT
CORDS BIPOLAR (ELECTRODE) IMPLANT
COVER MAYO STAND STRL (DRAPES) ×2 IMPLANT
COVER TABLE BACK 60X90 (DRAPES) ×2 IMPLANT
CUFF TOURNIQUET SINGLE 18IN (TOURNIQUET CUFF) IMPLANT
DECANTER SPIKE VIAL GLASS SM (MISCELLANEOUS) IMPLANT
DRAPE EXTREMITY T 121X128X90 (DRAPE) ×2 IMPLANT
DRAPE SURG 17X23 STRL (DRAPES) ×2 IMPLANT
GAUZE XEROFORM 1X8 LF (GAUZE/BANDAGES/DRESSINGS) ×2 IMPLANT
GLOVE BIO SURGEON STRL SZ 6.5 (GLOVE) ×1 IMPLANT
GLOVE SS BIOGEL STRL SZ 6.5 (GLOVE) ×1 IMPLANT
GLOVE SS BIOGEL STRL SZ 8 (GLOVE) IMPLANT
GLOVE SUPERSENSE BIOGEL SZ 6.5 (GLOVE) ×1
GLOVE SUPERSENSE BIOGEL SZ 8 (GLOVE) ×1
GLOVE SURG ORTHO 8.0 STRL STRW (GLOVE) IMPLANT
GOWN BRE IMP PREV XXLGXLNG (GOWN DISPOSABLE) ×2 IMPLANT
GOWN PREVENTION PLUS XLARGE (GOWN DISPOSABLE) ×2 IMPLANT
NEEDLE 27GAX1X1/2 (NEEDLE) IMPLANT
NS IRRIG 1000ML POUR BTL (IV SOLUTION) ×2 IMPLANT
PACK BASIN DAY SURGERY FS (CUSTOM PROCEDURE TRAY) ×2 IMPLANT
PADDING CAST ABS 4INX4YD NS (CAST SUPPLIES) ×1
PADDING CAST ABS COTTON 4X4 ST (CAST SUPPLIES) ×1 IMPLANT
SPONGE GAUZE 4X4 12PLY (GAUZE/BANDAGES/DRESSINGS) ×2 IMPLANT
STOCKINETTE 4X48 STRL (DRAPES) ×2 IMPLANT
SUT VICRYL RAPIDE 4/0 PS 2 (SUTURE) ×2 IMPLANT
SYR BULB 3OZ (MISCELLANEOUS) ×2 IMPLANT
SYR CONTROL 10ML LL (SYRINGE) IMPLANT
TOWEL OR 17X24 6PK STRL BLUE (TOWEL DISPOSABLE) ×4 IMPLANT
UNDERPAD 30X30 INCONTINENT (UNDERPADS AND DIAPERS) ×2 IMPLANT
WATER STERILE IRR 1000ML POUR (IV SOLUTION) ×2 IMPLANT

## 2011-11-19 NOTE — Transfer of Care (Signed)
Immediate Anesthesia Transfer of Care Note  Patient: [name]  Procedure(s) Performed:  RELEASE TRIGGER FINGER/A-1 PULLEY - release a-1 pulley right index finger and cyst removal  Patient Location: PACU  Anesthesia Type: Bier block  Level of Consciousness: awake, alert , oriented and patient cooperative  Airway & Oxygen Therapy: Patient Spontanous Breathing and Patient connected to face mask oxygen  Post-op Assessment: Report given to PACU RN, Post -op Vital signs reviewed and stable and Patient moving all extremities  Post vital signs: Reviewed and stable  Complications: No apparent anesthesia complications

## 2011-11-19 NOTE — Op Note (Signed)
Pre-op Dx: STS rif possible cyst Post-op Dx: sane Operation: release A-1 pulley RIF excision cyst Surgeon G Nalla Purdy   DESCRIPTION OF PROCEDURE:  The patient was brought to the operating room where a forearm based IV regional anesthetic was carried out without difficulty.  She was prepped using ChloraPrep, supine position with the right arm free.  A 3-minute dry time was allowed.  Time-out taken confirming patient procedure.  An oblique incision was made over the A1 pulley of the right index finger and carried down through subcutaneous tissue. Bleeders were electrocauterized.  The A1 pulley was identified. Retractors placed protecting neurovascular bundles.  Significant thickening of the A1 pulley was noted.  A proximal tenosynovitis was also present. The A1 pulley was released on its radial aspect.  A flexor sheath cyst was removed .   No further triggering was noted with full flexion extension.   The wound was irrigated to each finger and closed with interrupted 4-0 Vicryl Rapide sutures, local infiltration with 0.25% Marcaine without epinephrine was given to each of the digits.  Total of 4 mL was used. A sterile compressive dressing was applied.  On deflation of the tourniquet, all fingers immediately pinked.  She was taken to the recovery room for observation in satisfactory condition.

## 2011-11-19 NOTE — Addendum Note (Signed)
Addendum  created 11/19/11 1023 by Tylicia Sherman D Eldredge Veldhuizen   Modules edited:Anesthesia Medication Administration    

## 2011-11-19 NOTE — Anesthesia Postprocedure Evaluation (Signed)
  Anesthesia Post Note  Patient: [name]  Procedure(s) Performed:  RELEASE TRIGGER FINGER/A-1 PULLEY - release a-1 pulley right index finger and cyst removal  Anesthesia type: General  Patient location: PACU  Post pain: Pain level controlled  Post assessment: Patient's Cardiovascular Status Stable  Last Vitals:  Filed Vitals:   11/19/11 0945  BP: 134/69  Pulse: 84  Temp:   Resp: 17    Post vital signs: Reviewed and stable  Level of consciousness: alert  Complications: No apparent anesthesia complications

## 2011-11-19 NOTE — Brief Op Note (Signed)
11/19/2011  9:19 AM  PATIENT:  [name]  70 y.o. female  PRE-OPERATIVE DIAGNOSIS:  sts right index finger, probable cyst  POST-OPERATIVE DIAGNOSIS:  sts right index finger, cyst  PROCEDURE:  Procedure(s): RELEASE TRIGGER FINGER/A-1 PULLEY  SURGEON:  Surgeon(s): Nicki Reaper, MD  PHYSICIAN ASSISTANT:   ASSISTANTS: none   ANESTHESIA:   local and regional  EBL:  Total I/O In: 700 [I.V.:700] Out: -   BLOOD ADMINISTERED:none  DRAINS: none   LOCAL MEDICATIONS USED:  MARCAINE 4CC  SPECIMEN:  No Specimen  DISPOSITION OF SPECIMEN:  N/A  COUNTS:  YES  TOURNIQUET:   Total Tourniquet Time Documented: Upper Arm (Right) - 21 minutes  DICTATION: .Note written in EPIC  PLAN OF CARE: Discharge to home after PACU  PATIENT DISPOSITION:  PACU - hemodynamically stable.

## 2011-11-19 NOTE — H&P (Signed)
[name] returns to the office today. She has not been seen in over a year. She is complaining of swelling in her right hand, wrist area, dorsal radial, and basilar area of her thumb. She recalls no specific history of injury to it. It has gradually crept up on her. She has run out of her Meloxicam which did give her some relief. She does have a paraffin wax bath. She does have splints which she has not been using.  PAST MEDICAL HISTORY: Unchanged.  ALLERGIES: Sulfa  [name] comes to the office today for examination of her stenosing tenosynovitis right index finger.  This continues to trigger on her.   A lump is palpable and I suspect is a flexor sheath cyst.  This has been injected on two occasions.  We would recommend surgical release with excision of the cyst.  The pre, peri and postoperative course were discussed along with the risks and complications.  The patient is aware there is no guarantee with the surgery, possibility of infection, recurrence, injury to arteries, nerves, tendons, incomplete relief of symptoms and dystrophy.  She would like to proceed to have this done.  This will be scheduled at her convenience as an outpatient at [facility].  [name] is an 70 y.o. female.   Chief Complaint: trigger RIF HPI: see above  Past Medical History  Diagnosis Date  . Asthma     uses inhaler just prior to surgery to avoid attack  . Shortness of breath     with exertion  . GERD (gastroesophageal reflux disease)   . Hiatal hernia     patient does NOT have nerve/muscle disease  . Depression     no current issue/treatment; situation  . Sinus problem     frequent infections/congestion  . Back pain     from previous injury  . Knee pain   . Non-diabetic pancreatic hormone dysfunction years    pt. 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 Foot osteotomy 6 weeks ago    Left foot: great, 2nd & 3rd  . Foot osteotomy 5 years ago   Right great toe  . Abdominal hysterectomy   . Wrist ganglion excision 1980's    right  . Nasal sinus surgery most recent 7-8 yrs ago    7 sinus surgeries   . Carpal tunnel release 10+ years ago    bilateral  . Hand surgery 2011-most recent    multiple hand surgeries  . Eye surgery 3 yrs ago    bilateral cataracts    Family History  Problem Relation Age of Onset  . Anesthesia problems Father     "bad lungs" couldn't wake him up   Social History:  reports that she has never smoked. She has never used smokeless tobacco. She reports that she drinks alcohol. She reports that she does not use illicit drugs.  Allergies:  Allergies  Allergen Reactions  . Cinobac (Cinoxacin) Anaphylaxis    "My throat swelled and I stopped breathing."  . Contrast Media (Iodinated Diagnostic Agents) Other (See Comments)    "Seizure"  . Latex Dermatitis    Pt is highly reactive to any latex products. Blisters skin. "Takes my skin off"  . Sulfa Antibiotics Itching    Medications Prior to Admission  Medication Dose Route Frequency Provider Last Rate Last Dose  . ceFAZolin (ANCEF) IVPB 1 g/50 mL premix  1 g Intravenous 60 min Pre-Op       .  chlorhexidine (HIBICLENS) 4 % liquid 4 application  60 mL Topical Once       . fentaNYL (SUBLIMAZE) injection 50-100 mcg  50-100 mcg Intravenous PRN Constance Goltz, MD      . lactated ringers infusion   Intravenous Continuous Constance Goltz, MD      . lactated ringers infusion   Intravenous Continuous Constance Goltz, MD 20 mL/hr at 11/19/11 0801    . midazolam (VERSED) injection 0.5-2 mg  0.5-2 mg Intravenous PRN Constance Goltz, MD       Medications Prior to Admission  Medication Sig Dispense Refill  . Albuterol Sulfate (PROVENTIL HFA IN) Inhale 2 puffs into the lungs as needed.        . ALPRAZOLAM ER PO Take 1 mg by mouth as needed.        . cefUROXime (CEFTIN) 250 MG tablet Take 500 mg by mouth 2 (two) times daily.        Marland Kitchen  estrogens, conjugated, (PREMARIN) 1.25 MG tablet Take 1.25 mg by mouth daily.        . fexofenadine-pseudoephedrine (ALLEGRA-D) 60-120 MG per tablet Take 1 tablet by mouth daily.        . lansoprazole (PREVACID) 15 MG capsule Take 30 mg by mouth daily.        . rosuvastatin (CRESTOR) 5 MG tablet Take 5 mg by mouth daily.        Marland Kitchen triamcinolone cream (KENALOG) 0.1 % Apply topically 2 (two) times daily. For generalized rash over entire body-given by allergist. Started cream yesterday-rash is improved.       Marland Kitchen UNABLE TO FIND Med Name: Align for nausea( probiotic)         Results for orders placed during the hospital encounter of 11/19/11 (from the past 48 hour(s))  POCT HEMOGLOBIN-HEMACUE     Status: Normal   Collection Time   11/19/11  8:10 AM      Component Value Range Comment   Hemoglobin 12.0  12.0 - 15.0 (g/dL)     Dg Chest 2 View  16/09/9603  *RADIOLOGY REPORT*  Clinical Data: Cough, bronchitis  CHEST - 2 VIEW  Comparison: None.  Findings: No active infiltrate or effusion is seen. Mild apical pleuroparenchymal scarring is present.  Mediastinal contours appear normal.  The heart is within normal limits in size.  Mild thoracolumbar scoliosis is present.  IMPRESSION: No active lung disease.  Mild thoracolumbar scoliosis.  Original Report Authenticated By: Juline Patch, M.D.     A comprehensive review of systems was negative.  Blood pressure 141/88, pulse 96, temperature 98 F (36.7 C), temperature source Axillary, resp. rate 20, height 4\' 11"  (1.499 m), weight 49.896 kg (110 lb), SpO2 98.00%.  General appearance: alert, cooperative and appears stated age Head: Normocephalic, without obvious abnormality Neck: no adenopathy Resp: clear to auscultation bilaterally Cardio: regular rate and rhythm, S1, S2 normal, no murmur, click, rub or gallop GI: soft, non-tender; bowel sounds normal; no masses,  no organomegaly Extremities: extremities normal, atraumatic, no cyanosis or edema Pulses:  2+ and symmetric Skin: Skin color, texture, turgor normal. No rashes or lesions Neurologic: Grossly normal Incision/Wound: na  Assessment/Plan STS RIF  Shaeley Segall R 11/19/2011, 8:37 AM

## 2011-11-19 NOTE — Anesthesia Preprocedure Evaluation (Addendum)
Anesthesia Evaluation  Patient identified by MRN, date of birth, ID band Patient awake    Reviewed: Allergy & Precautions, H&P , NPO status , Patient's Chart, lab work & pertinent test results, reviewed documented beta blocker date and time   Airway Mallampati: II TM Distance: >3 FB Neck ROM: full    Dental   Pulmonary shortness of breath, asthma ,          Cardiovascular neg cardio ROS     Neuro/Psych PSYCHIATRIC DISORDERS  Neuromuscular disease    GI/Hepatic negative GI ROS, Neg liver ROS,   Endo/Other  Negative Endocrine ROS  Renal/GU negative Renal ROS  Genitourinary negative   Musculoskeletal   Abdominal   Peds  Hematology negative hematology ROS (+)   Anesthesia Other Findings See surgeon's H&P   Reproductive/Obstetrics negative OB ROS                           Anesthesia Physical Anesthesia Plan  ASA: II  Anesthesia Plan: Bier Block and MAC   Post-op Pain Management:    Induction:   Airway Management Planned: Simple Face Mask  Additional Equipment:   Intra-op Plan:   Post-operative Plan:   Informed Consent: I have reviewed the patients History and Physical, chart, labs and discussed the procedure including the risks, benefits and alternatives for the proposed anesthesia with the patient or authorized representative who has indicated his/her understanding and acceptance.     Plan Discussed with: CRNA and Surgeon  Anesthesia Plan Comments:         Anesthesia Quick Evaluation

## 2011-11-19 NOTE — Addendum Note (Signed)
Addendum  created 11/19/11 1023 by Jewel Baize Nuala Chiles   Modules edited:Anesthesia Medication Administration

## 2011-11-20 ENCOUNTER — Encounter (HOSPITAL_BASED_OUTPATIENT_CLINIC_OR_DEPARTMENT_OTHER): Payer: Self-pay | Admitting: Orthopedic Surgery

## 2013-03-02 ENCOUNTER — Ambulatory Visit: Payer: Medicare Other | Admitting: Internal Medicine

## 2014-11-22 DIAGNOSIS — J45909 Unspecified asthma, uncomplicated: Secondary | ICD-10-CM | POA: Insufficient documentation

## 2014-11-22 DIAGNOSIS — E781 Pure hyperglyceridemia: Secondary | ICD-10-CM | POA: Insufficient documentation

## 2014-11-22 DIAGNOSIS — E876 Hypokalemia: Secondary | ICD-10-CM | POA: Insufficient documentation

## 2014-11-22 DIAGNOSIS — D126 Benign neoplasm of colon, unspecified: Secondary | ICD-10-CM | POA: Insufficient documentation

## 2014-11-22 DIAGNOSIS — F419 Anxiety disorder, unspecified: Secondary | ICD-10-CM | POA: Insufficient documentation

## 2014-11-22 DIAGNOSIS — Z9109 Other allergy status, other than to drugs and biological substances: Secondary | ICD-10-CM | POA: Insufficient documentation

## 2014-11-22 DIAGNOSIS — Z872 Personal history of diseases of the skin and subcutaneous tissue: Secondary | ICD-10-CM | POA: Insufficient documentation

## 2016-09-17 DIAGNOSIS — M1710 Unilateral primary osteoarthritis, unspecified knee: Secondary | ICD-10-CM | POA: Insufficient documentation

## 2016-09-28 ENCOUNTER — Emergency Department: Payer: Medicare Other

## 2016-09-28 ENCOUNTER — Encounter: Payer: Self-pay | Admitting: Emergency Medicine

## 2016-09-28 ENCOUNTER — Emergency Department
Admission: EM | Admit: 2016-09-28 | Discharge: 2016-09-28 | Disposition: A | Discharge status: Home / Self Care | Payer: Medicare Other | Attending: Emergency Medicine | Admitting: Emergency Medicine

## 2016-09-28 DIAGNOSIS — R531 Weakness: Secondary | ICD-10-CM | POA: Diagnosis present

## 2016-09-28 DIAGNOSIS — N3000 Acute cystitis without hematuria: Secondary | ICD-10-CM | POA: Diagnosis not present

## 2016-09-28 DIAGNOSIS — J45909 Unspecified asthma, uncomplicated: Secondary | ICD-10-CM | POA: Insufficient documentation

## 2016-09-28 DIAGNOSIS — R42 Dizziness and giddiness: Secondary | ICD-10-CM | POA: Diagnosis not present

## 2016-09-28 LAB — COMPREHENSIVE METABOLIC PANEL
ALT: 18 U/L (ref 14–54)
ANION GAP: 8 (ref 5–15)
AST: 28 U/L (ref 15–41)
Albumin: 3.4 g/dL — ABNORMAL LOW (ref 3.5–5.0)
Alkaline Phosphatase: 57 U/L (ref 38–126)
BILIRUBIN TOTAL: 0.6 mg/dL (ref 0.3–1.2)
BUN: 9 mg/dL (ref 6–20)
CHLORIDE: 105 mmol/L (ref 101–111)
CO2: 25 mmol/L (ref 22–32)
Calcium: 8.7 mg/dL — ABNORMAL LOW (ref 8.9–10.3)
Creatinine, Ser: 0.79 mg/dL (ref 0.44–1.00)
Glucose, Bld: 107 mg/dL — ABNORMAL HIGH (ref 65–99)
POTASSIUM: 2.9 mmol/L — AB (ref 3.5–5.1)
Sodium: 138 mmol/L (ref 135–145)
TOTAL PROTEIN: 6.2 g/dL — AB (ref 6.5–8.1)

## 2016-09-28 LAB — URINALYSIS COMPLETE WITH MICROSCOPIC (ARMC ONLY)
BILIRUBIN URINE: NEGATIVE
GLUCOSE, UA: NEGATIVE mg/dL
HGB URINE DIPSTICK: NEGATIVE
NITRITE: NEGATIVE
Protein, ur: 30 mg/dL — AB
SPECIFIC GRAVITY, URINE: 1.013 (ref 1.005–1.030)
pH: 8 (ref 5.0–8.0)

## 2016-09-28 LAB — CBC WITH DIFFERENTIAL/PLATELET
BASOS ABS: 0.1 10*3/uL (ref 0–0.1)
Basophils Relative: 1 %
Eosinophils Absolute: 0.4 10*3/uL (ref 0–0.7)
Eosinophils Relative: 3 %
HEMATOCRIT: 36.3 % (ref 35.0–47.0)
Hemoglobin: 12.3 g/dL (ref 12.0–16.0)
LYMPHS ABS: 2.1 10*3/uL (ref 1.0–3.6)
LYMPHS PCT: 19 %
MCH: 29.3 pg (ref 26.0–34.0)
MCHC: 33.9 g/dL (ref 32.0–36.0)
MCV: 86.5 fL (ref 80.0–100.0)
MONO ABS: 1.3 10*3/uL — AB (ref 0.2–0.9)
Monocytes Relative: 12 %
NEUTROS ABS: 7.4 10*3/uL — AB (ref 1.4–6.5)
Neutrophils Relative %: 65 %
Platelets: 320 10*3/uL (ref 150–440)
RBC: 4.2 MIL/uL (ref 3.80–5.20)
RDW: 16.5 % — AB (ref 11.5–14.5)
WBC: 11.4 10*3/uL — ABNORMAL HIGH (ref 3.6–11.0)

## 2016-09-28 LAB — ETHANOL

## 2016-09-28 LAB — TROPONIN I

## 2016-09-28 MED ORDER — POTASSIUM CHLORIDE 20 MEQ PO PACK
20.0000 meq | PACK | Freq: Two times a day (BID) | ORAL | Status: DC
  Administered 2016-09-28: 20 meq via ORAL
  Filled 2016-09-28: qty 1

## 2016-09-28 MED ORDER — CEPHALEXIN 500 MG PO CAPS
500.0000 mg | ORAL_CAPSULE | Freq: Once | ORAL | Status: AC
  Administered 2016-09-28: 500 mg via ORAL
  Filled 2016-09-28: qty 1

## 2016-09-28 MED ORDER — LORAZEPAM 2 MG/ML IJ SOLN
1.0000 mg | Freq: Once | INTRAMUSCULAR | Status: AC
  Administered 2016-09-28: 1 mg via INTRAVENOUS
  Filled 2016-09-28: qty 1

## 2016-09-28 MED ORDER — POTASSIUM CHLORIDE CRYS ER 20 MEQ PO TBCR
40.0000 meq | EXTENDED_RELEASE_TABLET | Freq: Once | ORAL | Status: DC
  Filled 2016-09-28: qty 2

## 2016-09-28 MED ORDER — CEPHALEXIN 500 MG PO CAPS: 500.0000 mg | ORAL_CAPSULE | Freq: Four times a day (QID) | ORAL | 0 refills | Status: AC

## 2016-09-28 NOTE — ED Triage Notes (Signed)
Pt was at home and reports being dizzy and tingling in both hands. Also c/o shortness of breath.

## 2016-09-28 NOTE — ED Provider Notes (Addendum)
[facility] Emergency Department Provider Note  ____________________________________________   I have reviewed the triage vital signs and the nursing notes.   HISTORY  Chief Complaint Weakness    HPI [name] is a 75 y.o. female history of vertigo, anxiety,asthma, depression, who states that she gets vertigo every couple months. This morning she woke up in her normal state and then had a fit of vertigo which seem to be more intense than normal. It is now gone. She is very anxious and upset about this. She states her entire body was tingling and she did vomit once. She had no focal numbness or weakness. She's had no recent URI symptoms. She denies chest pain or shortness of breath or if occult he speaking or walking. She states that she had a true sensation of spinning which is now gone.     Past Medical History:  Diagnosis Date  . Asthma    uses inhaler just prior to surgery to avoid attack  . Back pain    from previous injury  . Depression    no current issue/treatment; situation  . GERD (gastroesophageal reflux disease)   . Hiatal hernia    patient does NOT have nerve/muscle disease  . Knee pain   . 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 Shortness of breath    with exertion  . Sinus problem    frequent infections/congestion    There are no active problems to display for this patient.   Past Surgical History:  Procedure Laterality Date  . ABDOMINAL HYSTERECTOMY    . CARPAL TUNNEL RELEASE  10+ years ago   bilateral  . EYE SURGERY  3 yrs ago   bilateral cataracts  . FOOT OSTEOTOMY  6 weeks ago   Left foot: great, 2nd & 3rd  . FOOT OSTEOTOMY  5 years ago   Right great toe  . HAND SURGERY  2011-most recent   multiple hand surgeries  . NASAL SINUS SURGERY  most recent 7-8 yrs ago   7 sinus surgeries   . TRIGGER FINGER RELEASE  11/19/2011   Procedure: RELEASE TRIGGER  FINGER/A-1 PULLEY;  Surgeon: Wynonia Sours, MD;  Location: [facility];  Service: Orthopedics;  Laterality: Right;  release a-1 pulley right index finger and cyst removal  . WRIST GANGLION EXCISION  1980's   right    Prior to Admission medications   Medication Sig Start Date End Date Taking? Authorizing Provider  Albuterol Sulfate (PROVENTIL HFA IN) Inhale 2 puffs into the lungs as needed.      Historical Provider, MD  ALPRAZOLAM ER PO Take 1 mg by mouth as needed.      Historical Provider, MD  cefUROXime (CEFTIN) 250 MG tablet Take 500 mg by mouth 2 (two) times daily.      Historical Provider, MD  estrogens, conjugated, (PREMARIN) 1.25 MG tablet Take 1.25 mg by mouth daily.      Historical Provider, MD  fexofenadine-pseudoephedrine (ALLEGRA-D) 60-120 MG per tablet Take 1 tablet by mouth daily.      Historical Provider, MD  lansoprazole (PREVACID) 15 MG capsule Take 30 mg by mouth daily.      Historical Provider, MD  rosuvastatin (CRESTOR) 5 MG tablet Take 5 mg by mouth daily.      Historical Provider, MD  triamcinolone cream (KENALOG) 0.1 % Apply topically 2 (two) times daily. For generalized rash over entire body-given by allergist. Started cream  yesterday-rash is improved.     Historical Provider, MD  [place] Name: Align for nausea( probiotic)     Historical Provider, MD    Allergies Cinobac [cinoxacin]; Contrast media [iodinated diagnostic agents]; Latex; Sulfa antibiotics; and Other  Family History  Problem Relation Age of Onset  . Anesthesia problems Father     "bad lungs" couldn't wake him up    Social History Social History  Substance Use Topics  . Smoking status: Never Smoker  . Smokeless tobacco: Never Used  . Alcohol use 0.0 oz/week    Review of Systems Constitutional: No fever/chills Eyes: No visual changes. ENT: No sore throat. No stiff neck no neck pain Cardiovascular: Denies chest pain. Respiratory: Denies shortness of  breath. Gastrointestinal:   As above vomiting.  No diarrhea.  No constipation. Genitourinary: Negative for dysuria. Musculoskeletal: Negative lower extremity swelling Skin: Negative for rash. Neurological: Negative for severe headaches, focal weakness or numbness. 10-point ROS otherwise negative.  ____________________________________________   PHYSICAL EXAM:  VITAL SIGNS: ED Triage Vitals  Enc Vitals Group     BP 09/28/16 1204 (!) 188/65     Pulse Rate 09/28/16 1204 82     Resp 09/28/16 1204 17     Temp 09/28/16 1204 97.9 F (36.6 C)     Temp Source 09/28/16 1204 Oral     SpO2 09/28/16 1204 100 %     Weight 09/28/16 1205 160 lb (72.6 kg)     Height --      Head Circumference --      Peak Flow --      Pain Score --      Pain Loc --      Pain Edu? --      Excl. in [place]? --     Constitutional: Alert and oriented. Well appearing and in no acute distress.Very anxious and upset but otherwise nontoxic Eyes: Conjunctivae are normal. PERRL. EOMI. TM: Right TM has a mild fluid level but no evidence of infection left TM is normal Head: Atraumatic. Nose: No congestion/rhinnorhea. Mouth/Throat: Mucous membranes are moist.  Oropharynx non-erythematous. Neck: No stridor.   Nontender with no meningismus Cardiovascular: Normal rate, regular rhythm. Grossly normal heart sounds.  Good peripheral circulation. Respiratory: Normal respiratory effort.  No retractions. Lungs CTAB. Abdominal: Soft and nontender. No distention. No guarding no rebound Back:  There is no focal tenderness or step off.  there is no midline tenderness there are no lesions noted. there is no CVA tenderness Musculoskeletal: No lower extremity tenderness, no upper extremity tenderness. No joint effusions, no DVT signs strong distal pulses no edema Neurologic:  Cranial nerves II through XII are grossly intact 5 out of 5 strength bilateral upper and lower extremity. Finger to nose within normal limits heel to shin within  normal limits, speech is normal with no word finding difficulty or dysarthria, reflexes symmetric, pupils are equally round and reactive to light, there is no pronator drift, sensation is normal, vision is intact to confrontation, gait is deferred, there is no nystagmus, normal neurologic exam Skin:  Skin is warm, dry and intact. No rash noted. Psychiatric: Mood and affect are anxious and upset. Speech and behavior are normal.  ____________________________________________   LABS (all labs ordered are listed, but only abnormal results are displayed)  Labs Reviewed  CBC WITH DIFFERENTIAL/PLATELET - Abnormal; Notable for the following:       Result Value   WBC 11.4 (*)    RDW 16.5 (*)    Neutro  Abs 7.4 (*)    Monocytes Absolute 1.3 (*)    All other components within normal limits  URINALYSIS COMPLETEWITH MICROSCOPIC ([facility] ONLY)  ETHANOL  TROPONIN I  COMPREHENSIVE METABOLIC PANEL   ____________________________________________  EKG  I personally interpreted any EKGs ordered by me or triage Normal sinus rhythm rate 70 bpm borderline long QT, normal axis no acute ST elevation or depression ____________________________________________  RADIOLOGY  I reviewed any imaging ordered by me or triage that were performed during my shift and, if possible, patient and/or family made aware of any abnormal findings. ____________________________________________   PROCEDURES  Procedure(s) performed: None  Procedures  Critical Care performed: None  ____________________________________________   INITIAL IMPRESSION / ASSESSMENT AND PLAN / ED COURSE  Pertinent labs & imaging results that were available during my care of the patient were reviewed by me and considered in my medical decision making (see chart for details).  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 She does have a  little bit of fluid in her right ear behind the tympanum otherwise her EACs and other exam of the ears are normal with no evidence of infection. There is nothing at this time to suggest acute CVA given her age, we'll obtain a CT scan of the head. Patient is very anxious and upset and she is requesting something to calm her nerves prior to a CT scan, she is not allergic to Ativan we will give her a dose here which I think will help her symptoms in general. She has no ongoing nystagmus and certainly at this time no cerebellar signs. Low suspicion for CVA   ----------------------------------------- 1:32 PM on 09/28/2016 -----------------------------------------  She has no symptoms feels "100% better" is requesting discharge. Is noted to have a urinary tract infection. We will treat her with Keflex pending culture. We'll ambulate her to ensure stability. She remains neurologically intact otherwise. Patient is not showing any evidence of sepsis or urosepsis. We will send culture obviously. Return precautions and follow-up given and understood. She does have her own ENT and we will advise her to follow-up with him.   Clinical Course   ____________________________________________   FINAL CLINICAL IMPRESSION(S) / ED DIAGNOSES  Final diagnoses:  None      This chart was dictated using voice recognition software.  Despite best efforts to proofread,  errors can occur which can change meaning.      Schuyler Amor, MD 09/28/16 [place], MD 09/28/16 [place], MD 09/28/16 [phone]

## 2016-09-28 NOTE — ED Notes (Signed)
Pt reports still feels weak but tingling in hands is better since medication.

## 2016-09-30 LAB — URINE CULTURE: Culture: >=100000 — AB

## 2017-07-24 ENCOUNTER — Ambulatory Visit: Admission: RE | Admit: 2017-07-24 | Discharge: 2017-07-24 | Payer: MEDICARE | Admitting: Dermatology

## 2017-07-24 DIAGNOSIS — L309 Dermatitis, unspecified: Principal | ICD-10-CM

## 2017-07-24 DIAGNOSIS — L509 Urticaria, unspecified: Secondary | ICD-10-CM

## 2017-07-24 MED ORDER — HYDROXYZINE HCL 25 MG TABLET
ORAL_TABLET | ORAL | 1 refills | 0.00000 days | Status: CP | PRN
Start: 2017-07-24 — End: 2017-08-23

## 2017-08-17 ENCOUNTER — Ambulatory Visit
Admission: RE | Admit: 2017-08-17 | Discharge: 2017-08-17 | Payer: MEDICARE | Attending: Dermatology | Admitting: Dermatology

## 2017-08-17 DIAGNOSIS — Z79899 Other long term (current) drug therapy: Secondary | ICD-10-CM

## 2017-08-17 DIAGNOSIS — R21 Rash and other nonspecific skin eruption: Secondary | ICD-10-CM

## 2017-08-17 DIAGNOSIS — L309 Dermatitis, unspecified: Principal | ICD-10-CM

## 2017-08-17 MED ORDER — PREDNISONE 10 MG TABLET
ORAL_TABLET | 0 refills | 0 days | Status: CP
Start: 2017-08-17 — End: 2017-11-25

## 2017-08-17 MED ORDER — METHOTREXATE SODIUM 2.5 MG TABLET
ORAL_TABLET | 4 refills | 0 days | Status: CP
Start: 2017-08-17 — End: 2017-09-15

## 2017-09-14 ENCOUNTER — Ambulatory Visit: Admission: RE | Admit: 2017-09-14 | Discharge: 2017-09-14 | Payer: MEDICARE

## 2017-09-14 DIAGNOSIS — Z79899 Other long term (current) drug therapy: Secondary | ICD-10-CM

## 2017-09-14 DIAGNOSIS — L309 Dermatitis, unspecified: Principal | ICD-10-CM

## 2017-09-15 MED ORDER — FOLIC ACID 1 MG TABLET
ORAL_TABLET | 5 refills | 0.00000 days | Status: CP
Start: 2017-09-15 — End: 2018-03-23

## 2017-09-15 MED ORDER — METHOTREXATE SODIUM 2.5 MG TABLET
ORAL_TABLET | 1 refills | 0.00000 days | Status: CP
Start: 2017-09-15 — End: 2017-11-17

## 2017-11-17 MED ORDER — METHOTREXATE SODIUM 2.5 MG TABLET
ORAL_TABLET | 0 refills | 0.00000 days | Status: CP
Start: 2017-11-17 — End: 2017-11-20

## 2017-11-20 ENCOUNTER — Ambulatory Visit
Admission: RE | Admit: 2017-11-20 | Discharge: 2017-11-20 | Disposition: A | Discharge status: Home / Self Care | Payer: MEDICARE | Admitting: Dermatology

## 2017-11-20 ENCOUNTER — Ambulatory Visit
Admission: RE | Admit: 2017-11-20 | Discharge: 2017-11-20 | Disposition: A | Discharge status: Home / Self Care | Payer: MEDICARE

## 2017-11-20 DIAGNOSIS — Z0389 Encounter for observation for other suspected diseases and conditions ruled out: Secondary | ICD-10-CM

## 2017-11-20 DIAGNOSIS — R21 Rash and other nonspecific skin eruption: Secondary | ICD-10-CM

## 2017-11-20 DIAGNOSIS — Z79899 Other long term (current) drug therapy: Secondary | ICD-10-CM

## 2017-11-25 MED ORDER — PREDNISONE 10 MG TABLET
ORAL_TABLET | ORAL | 0 refills | 0.00000 days | Status: CP
Start: 2017-11-25 — End: 2017-12-21

## 2017-11-25 MED ORDER — HYDROXYCHLOROQUINE 200 MG TABLET
ORAL_TABLET | Freq: Two times a day (BID) | ORAL | 2 refills | 0.00000 days | Status: CP
Start: 2017-11-25 — End: 2018-01-28

## 2017-12-21 ENCOUNTER — Ambulatory Visit: Admit: 2017-12-21 | Discharge: 2017-12-22 | Payer: MEDICARE | Attending: Dermatology | Primary: Dermatology

## 2017-12-21 DIAGNOSIS — L309 Dermatitis, unspecified: Principal | ICD-10-CM

## 2017-12-21 MED ORDER — PREDNISONE 10 MG TABLET
ORAL_TABLET | 0 refills | 0 days | Status: CP
Start: 2017-12-21 — End: 2018-01-28

## 2017-12-28 ENCOUNTER — Ambulatory Visit: Payer: Self-pay | Admitting: Allergy and Immunology

## 2018-01-25 ENCOUNTER — Ambulatory Visit: Admit: 2018-01-25 | Discharge: 2018-01-26 | Payer: MEDICARE

## 2018-01-25 DIAGNOSIS — L309 Dermatitis, unspecified: Principal | ICD-10-CM

## 2018-01-28 MED ORDER — PREDNISONE 10 MG TABLET
ORAL_TABLET | ORAL | 0 refills | 0.00000 days | Status: CP
Start: 2018-01-28 — End: 2018-03-12

## 2018-01-28 MED ORDER — HYDROXYCHLOROQUINE 200 MG TABLET
ORAL_TABLET | Freq: Two times a day (BID) | ORAL | 2 refills | 0.00000 days | Status: CP
Start: 2018-01-28 — End: 2018-02-12

## 2018-02-12 ENCOUNTER — Ambulatory Visit: Admit: 2018-02-12 | Discharge: 2018-02-13 | Payer: MEDICARE

## 2018-02-12 DIAGNOSIS — L309 Dermatitis, unspecified: Principal | ICD-10-CM

## 2018-02-12 MED ORDER — HYDROXYCHLOROQUINE 200 MG TABLET
ORAL_TABLET | Freq: Two times a day (BID) | ORAL | 2 refills | 0 days | Status: CP
Start: 2018-02-12 — End: 2018-10-04

## 2018-03-08 ENCOUNTER — Ambulatory Visit: Admit: 2018-03-08 | Discharge: 2018-03-09 | Payer: MEDICARE

## 2018-03-08 DIAGNOSIS — R21 Rash and other nonspecific skin eruption: Principal | ICD-10-CM

## 2018-03-10 ENCOUNTER — Ambulatory Visit: Admit: 2018-03-10 | Discharge: 2018-03-11 | Payer: MEDICARE

## 2018-03-10 DIAGNOSIS — L259 Unspecified contact dermatitis, unspecified cause: Principal | ICD-10-CM

## 2018-03-12 ENCOUNTER — Ambulatory Visit: Admit: 2018-03-12 | Discharge: 2018-03-13 | Payer: MEDICARE

## 2018-03-12 DIAGNOSIS — R21 Rash and other nonspecific skin eruption: Principal | ICD-10-CM

## 2018-03-23 MED ORDER — FOLIC ACID 1 MG TABLET
ORAL_TABLET | 5 refills | 0.00000 days | Status: CP
Start: 2018-03-23 — End: 2018-09-26

## 2018-04-12 ENCOUNTER — Ambulatory Visit: Admit: 2018-04-12 | Discharge: 2018-04-13 | Payer: MEDICARE

## 2018-04-12 DIAGNOSIS — L309 Dermatitis, unspecified: Principal | ICD-10-CM

## 2018-04-14 ENCOUNTER — Ambulatory Visit: Admit: 2018-04-14 | Discharge: 2018-04-15 | Payer: MEDICARE

## 2018-04-14 DIAGNOSIS — R21 Rash and other nonspecific skin eruption: Principal | ICD-10-CM

## 2018-06-15 ENCOUNTER — Telehealth: Payer: Self-pay | Admitting: Gastroenterology

## 2018-06-15 ENCOUNTER — Encounter: Payer: Self-pay | Admitting: Gastroenterology

## 2018-06-15 NOTE — Telephone Encounter (Signed)
Happy to see her.  Next available OV with me.  thanks

## 2018-06-15 NOTE — Telephone Encounter (Signed)
Dr Jacobs is this ok? 

## 2018-06-15 NOTE — Telephone Encounter (Signed)
Former Dr. Olevia Perches patient. Dr. Dalbert Batman at [place] is requesting that Dr. Eugenia Pancoast see patient for GERD. Is this ok? Thanks.

## 2018-06-15 NOTE — Telephone Encounter (Signed)
Appt sch on 08/17/18 at 3:00pm.

## 2018-06-15 NOTE — Telephone Encounter (Signed)
Left message for patient to return my call.

## 2018-06-18 ENCOUNTER — Ambulatory Visit: Admit: 2018-06-18 | Discharge: 2018-06-19 | Payer: MEDICARE

## 2018-06-18 DIAGNOSIS — L309 Dermatitis, unspecified: Principal | ICD-10-CM

## 2018-06-18 MED ORDER — DUPILUMAB 300 MG/2 ML SUBCUTANEOUS SYRINGE
INJECTION | SUBCUTANEOUS | 0 refills | 0.00000 days | Status: CP
Start: 2018-06-18 — End: 2018-06-18

## 2018-06-18 MED ORDER — DUPILUMAB 300 MG/2 ML SUBCUTANEOUS SYRINGE: Syringe | 0 refills | 0 days | Status: AC

## 2018-06-18 NOTE — Unmapped (Signed)
Dermatology Follow-up Note    A/P:    Eczematous dermatitis, flaring:  ?? Patch testing non-revealing 04/2018  ?? She has failed methotrexate 25 mg weekly for 3 months, hydroxychloroquine 200 mg twice daily for 6 months, class I topical steroids, nbUVb for 3 months, intramuscular triamcinolone, and numerous courses of oral prednisone  ?? Start dupilumab per below  ?? Day 0: inject dupilumab subcutaneously 600 mg on day 0. Day 15: inject dupilumab 300 mg every 14 days.  ?? Recommended she follow-up with PCP to ensure she is up-to-date on age-appropriate cancer screening to rule out paraneoplastic pruritus      Return in about 2 months (around 08/19/2018) for Re-check.      CC:  Evaluation of lesions    HPI:  Amanda Jordan is a friendly 77 y.o. female last seen by Dr. Dub Mikes 3 months ago for patch testing here for evaluation of lesions. Since last visit, she got a shot of steroids from her PCP 2 months ago that helped considerably. She has stopped numerous medications, desperate to get relief. The light therapy did not help much. She continues to be very itchy on her arms, legs, thighs, and back.    Disease course:  Patch testing non-revealing 04/2018  She has failed methotrexate 25 mg weekly, hydroxychloroquine, class I topical steroids, nbUVb, intramuscular triamcinolone, oral prednisone    Pertinent PMH:  No history of skin cancer    ROS:   Negative except as noted in HPI.    PE:  General: Well-developed, well-nourished female, in no acute distress.   Neuro: Alert and oriented, and answers questions appropriately.  Skin: Per patient request, focused exam with inspection and palpation of the face, neck, arms, legs was performed today. Findings were normal with exception of the following:  - Eczematous plaques on arms and legs  - All other areas examined were normal or had no significant findings    The patient was seen and examined by Essie Christine, MD who agrees with the assessment and plan as above.

## 2018-06-18 NOTE — Unmapped (Signed)
Patient Education        dupilumab  Pronunciation:  doo PIL [place] mab  Brand:  Dupixent  What is the most important information I should know about dupilumab?  Follow all directions on your medicine label and package. Tell each of your healthcare providers about all your medical conditions, allergies, and all medicines you use.  What is dupilumab?  Dupilumab is used in adults  to treat moderate-to-severe eczema that cannot be controlled with topical medicines (for use on the skin).  Dupilumab is also used together with other medications to prevent severe asthma attacks in adults and children who are at least 16 years old.  Dupilumab may also be used for purposes not listed in this medication guide.  What should I discuss with my healthcare provider before using dupilumab?  You should not use dupilumab if you are allergic to it.  Tell your doctor if you have ever had:  ?? eye problems;  ?? a parasite infection (such as roundworms or tapeworms); or  ?? if you are scheduled to receive any vaccine.  Tell your doctor if you are pregnant or breast-feeding.  How should I use dupilumab?  Follow all directions on your prescription label and read all medication guides or instruction sheets. Use the medicine exactly as directed.  Dupilumab is injected under the skin, usually once every other week. Your first dose may be given in 2 injections.  A healthcare provider may teach you how to properly use the medication by yourself. Read and carefully follow any Instructions for Use provided with your medicine. Ask your doctor or pharmacist if you don't understand all instructions.  Do not shake the prefilled syringe. Prepare your injection only when you are ready to give it. Do not use if the medicine looks cloudy, has changed colors, or has particles in it. Call your pharmacist for new medicine.  Store prefilled syringes in their original carton in the refrigerator. Protect from light and do not freeze.  Take a syringe out of the refrigerator and let it reach room temperature for 45 minutes before injecting your dose. Leave the needle cap on the syringe until you are ready to inject your dose.  You may store a prefilled syringe at cool room temperature for up to 14 days. Throw the medicine away if not used within 14 days. Do not put it back into the refrigerator.  Each prefilled syringe is for one use only. Throw it away after one use, even if there is still medicine left inside.  Use a needle and syringe only once and then place them in a puncture-proof sharps container. Follow state or local laws about how to dispose of this container. Keep it out of the reach of children and pets.  Dupilumab is not a rescue medicine for asthma attacks. Use only fast-acting inhalation medicine for an attack. Seek medical attention if your breathing problems get worse quickly, or if you think your asthma medications are not working as well.  If you also use asthma medication or any type of steroid, do not stop using it without your doctor's advice.  What happens if I miss a dose?  Use the medicine as soon as you can, but skip the missed dose if you are more than 7 days late for the injection. Do not use two doses at one time.  What happens if I overdose?  Seek emergency medical attention or call the Poison Help line at [phone].  What should I avoid while using dupilumab?  Do not receive a live vaccine while using dupilumab. The vaccine may not work as well during this time, and may not fully protect you from disease. Live vaccines include measles, mumps, rubella (MMR), polio, rotavirus, typhoid, yellow fever, varicella (chickenpox), zoster (shingles), and nasal flu (influenza) vaccine.  What are the possible side effects of dupilumab?  Get emergency medical help if you have signs of an allergic reaction: hives, itching; fever, swollen glands, joint pain; difficult breathing; swelling of your face, lips, tongue, or throat.  Call your doctor at once if you have:  ?? new or worsening eye pain or discomfort;  ?? vision changes;  ?? watery eyes (your eyes may be more sensitive to light);  ?? feeling like something is in your eye; or  ?? blood vessel inflammation --fever, chest pain, trouble breathing, skin rash, numbness or prickly feeling in your arms or legs.  Common side effects may include:  ?? pain, swelling, burning, or irritation where an injection was given;  ?? eye redness or itching;  ?? puffy eyelids; or  ?? cold sores or fever blisters on your lips or in your mouth.  This is not a complete list of side effects and others may occur. Call your doctor for medical advice about side effects. You may report side effects to FDA at 1-800-FDA-1088.  What other drugs will affect dupilumab?  Other drugs may affect dupilumab, including prescription and over-the-counter medicines, vitamins, and herbal products. Tell your doctor about all your current medicines and any medicine you start or stop using.  Where can I get more information?  Your pharmacist can provide more information about dupilumab.  Remember, keep this and all other medicines out of the reach of children, never share your medicines with others, and use this medication only for the indication prescribed.  Every effort has been made to ensure that the information provided by [employer] ('[employer]') is accurate, up-to-date, and complete, but no guarantee is made to that effect. Drug information contained herein may be time sensitive. [employer] information has been compiled for use by healthcare practitioners and consumers in the [place] and therefore [employer] does not warrant that uses outside of the [place] are appropriate, unless specifically indicated otherwise. [employer]'s drug information does not endorse drugs, diagnose patients or recommend therapy. [employer]'s drug information is an [employer], corporate to assist licensed healthcare practitioners in caring for their patients and/or to serve consumers viewing this service as a supplement to, and not a substitute for, the expertise, skill, knowledge and judgment of healthcare practitioners. The absence of a warning for a given drug or drug combination in no way should be construed to indicate that the drug or drug combination is safe, effective or appropriate for any given patient. [employer] does not assume any responsibility for any aspect of healthcare administered with the aid of information [employer] provides. The information contained herein is not intended to cover all possible uses, directions, precautions, warnings, drug interactions, allergic reactions, or adverse effects. If you have questions about the drugs you are taking, check with your doctor, nurse or pharmacist.  Copyright (848) 365-4638 [employer] Version: 2.01. Revision date: 10/22/2017.  Care instructions adapted under license by [facility]. If you have questions about a medical condition or this instruction, always ask your healthcare professional. [employer] disclaims any warranty or liability for your use of this information.

## 2018-06-21 ENCOUNTER — Other Ambulatory Visit: Payer: Self-pay | Admitting: General Surgery

## 2018-06-21 DIAGNOSIS — K219 Gastro-esophageal reflux disease without esophagitis: Secondary | ICD-10-CM

## 2018-06-25 ENCOUNTER — Ambulatory Visit
Admission: RE | Admit: 2018-06-25 | Discharge: 2018-06-25 | Disposition: A | Discharge status: Home / Self Care | Payer: Medicare Other | Source: Ambulatory Visit | Attending: General Surgery | Admitting: General Surgery

## 2018-06-25 DIAGNOSIS — K219 Gastro-esophageal reflux disease without esophagitis: Secondary | ICD-10-CM

## 2018-07-27 NOTE — Unmapped (Signed)
[facility] Specialty Medication Referral: PA APPROVED    Medication (Brand/Generic): DUPIXENT    Initial Test Claim completed with resulted information below:  No PA required  Patient ABLE to fill at [facility]:  [facility]  Anticipated Copay: $2956.21  Is anticipated copay with a copay card or grant? NO PATIENT NEVER CALLED BACK FOR MFG ASSISTANCE    As Co-pay is under $100 defined limit, per policy there will be no further investigation of need for financial assistance at this time unless patient requests. This referral has been communicated to the provider and handed off to the [facility] team for further processing and filling of prescribed medication.   ______________________________________________________________________  Please utilize this referral for viewing purposes as it will serve as the central location for all relevant documentation and updates.

## 2018-07-28 NOTE — Unmapped (Signed)
Spoke with patient, and she has chosen not to pursue manufacturer's assistance for Dupixent at this time. She is using olive oil on her skin, and this has improved her rash considerably. I advised her copay is high currently because she's in the coverage gap or donut hole of her plan. With mfg assistance, her copay would be $0 if she qualified. She thanked [place] for the explanation, but will still hold off for now. She has our contact info and the application if she should change her mind.    Natnael Biederman A. Katrinka Blazing, PharmD, BCPS - Pharmacist   [facility]   [address] [place], [place] [address]   t [phone] - f [phone]

## 2018-08-02 ENCOUNTER — Ambulatory Visit: Admit: 2018-08-02 | Discharge: 2018-08-03 | Payer: MEDICARE

## 2018-08-02 DIAGNOSIS — L309 Dermatitis, unspecified: Secondary | ICD-10-CM

## 2018-08-02 DIAGNOSIS — M7989 Other specified soft tissue disorders: Secondary | ICD-10-CM

## 2018-08-02 DIAGNOSIS — L820 Inflamed seborrheic keratosis: Principal | ICD-10-CM

## 2018-08-17 ENCOUNTER — Encounter: Payer: Self-pay | Admitting: Gastroenterology

## 2018-08-17 ENCOUNTER — Ambulatory Visit (INDEPENDENT_AMBULATORY_CARE_PROVIDER_SITE_OTHER): Payer: Medicare Other | Admitting: Gastroenterology

## 2018-08-17 VITALS — BP 132/80 | HR 96 | Ht <= 58 in | Wt 115.0 lb

## 2018-08-17 DIAGNOSIS — K219 Gastro-esophageal reflux disease without esophagitis: Secondary | ICD-10-CM

## 2018-08-17 NOTE — Progress Notes (Signed)
HPI: This is a   very pleasant 77 year old woman who was referred to me by Dr. Fanny Skates for GERD  Chief complaint is GERD  She has been bothered by significant heartburn for 20 or 30 years.  She tells me she has tried multiple different proton pump inhibitors at various times throughout the day without much effect.  Starting this past July takes protonix 40mg  pill after Breakfast. Then another between 4-9pm, not really timed with meals.    She takes ranitidine 300mg  at 5pm-6pm.  Started this regimen July 2019. She feels fine on this regimen.  Absolutely fine.  Her heartburn is gone.  Overall her weight is overall stable, watching what she eats.  Dysphagia with a pill only.  Often eats within 1-2 hours of laying down for bed.  Not much caffeine.    Old Data Reviewed: UGI 06/2018 ordered by Dr. Dalbert Batman. 1. Relatively large volume of gastroesophageal reflux observed every time the patient was supine. 2. Underlying small sliding-type gastric hiatal hernia. Otherwise normal stomach. 3. 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 Mild generalized decreased esophageal motility. 4. Normal duodenum and proximal small bowel.    Review of systems: Pertinent positive and negative review of systems were noted in the above HPI section. All other review negative.   Past Medical History:  Diagnosis Date  . Arthritis   . Asthma    uses inhaler just prior to surgery to avoid attack  . Back pain    from previous injury  . Depression    no current issue/treatment; situation  . Gallstones   . GERD (gastroesophageal reflux disease)   . Hiatal hernia    patient does NOT have nerve/muscle disease  . HLD (hyperlipidemia)   . HTN (hypertension)   . Kidney stones   . Knee pain   . 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 Pancreatitis   . Pneumonia   . Seizures ([place])   . Shortness of breath    with exertion  . Sinus problem    frequent infections/congestion    Past Surgical History:  Procedure Laterality Date  . ABDOMINAL HYSTERECTOMY    . APPENDECTOMY    . CARPAL TUNNEL RELEASE  10+ years ago   bilateral  . EYE SURGERY  3 yrs ago   bilateral cataracts  . FOOT OSTEOTOMY  6 weeks ago   Left foot: great, 2nd & 3rd  . FOOT OSTEOTOMY  5 years ago   Right great toe  . HAND SURGERY Bilateral 2011-most recent   multiple hand surgeries, 2 on left, 3 on right  . NASAL SINUS SURGERY  most recent 7-8 yrs ago   7 sinus surgeries   . TRIGGER FINGER RELEASE  11/19/2011   Procedure: RELEASE TRIGGER FINGER/A-1 PULLEY;  Surgeon: Wynonia Sours, MD;  Location: [facility];  Service: Orthopedics;  Laterality: Right;  release a-1 pulley right index finger and cyst removal  . WRIST GANGLION EXCISION  1980's   right    Current Outpatient Medications  Medication Sig Dispense Refill  . estrogens, conjugated, (PREMARIN) 1.25 MG tablet Take 1.25 mg by mouth daily.      . folic acid (FOLVITE) 1 MG tablet Take 3 tablets by mouth daily.    . hydrOXYzine (ATARAX/VISTARIL) 25 MG tablet Take 1 tablet by mouth 3 (three) times daily as needed.  3  . montelukast (SINGULAIR) 10 MG tablet Take 1 tablet by mouth at bedtime.    Marland Kitchen  pantoprazole (PROTONIX) 40 MG tablet Take 1 tablet by mouth 2 (two) times daily.    . ranitidine (ZANTAC) 300 MG tablet Take 1 tablet by mouth at bedtime.     No current facility-administered medications for this visit.     Allergies as of 08/17/2018 - Review Complete 08/17/2018  Allergen Reaction Noted  . Cinobac [cinoxacin] Anaphylaxis 11/18/2011  . Contrast media [iodinated diagnostic agents] Other (See Comments) 11/18/2011  . Latex Dermatitis 11/18/2011  . Sulfa antibiotics Itching 11/18/2011  . Other  09/28/2016    Family History  Problem Relation Age of Onset  . Anesthesia problems Father        "bad lungs" couldn't wake him up  . Heart disease  Father   . Breast cancer Maternal Grandmother   . Breast cancer Maternal Aunt        x 2  . Breast cancer Cousin   . Pancreatic cancer Cousin   . Heart disease Paternal Uncle     Social History   Socioeconomic History  . Marital status: Married    Spouse name: Not on file  . Number of children: 1  . Years of education: Not on file  . Highest education level: Not on file  Occupational History  . Occupation: retired  Scientific laboratory technician  . Financial resource strain: Not on file  . Food insecurity:    Worry: Not on file    Inability: Not on file  . Transportation needs:    Medical: Not on file    Non-medical: Not on file  Tobacco Use  . Smoking status: Never Smoker  . Smokeless tobacco: Never Used  Substance and Sexual Activity  . Alcohol use: Yes    Alcohol/week: 0.0 - 1.0 standard drinks    Comment: wine once every 2-3 months  . Drug use: No  . Sexual activity: Not on file  Lifestyle  . Physical activity:    Days per week: Not on file    Minutes per session: Not on file  . Stress: Not on file  Relationships  . Social connections:    Talks on phone: Not on file    Gets together: Not on file    Attends religious service: Not on file    Active member of club or organization: Not on file    Attends meetings of clubs or organizations: Not on file    Relationship status: Not on file  . Intimate partner violence:    Fear of current or ex partner: Not on file    Emotionally abused: Not on file    Physically abused: Not on file    Forced sexual activity: Not on file  Other Topics Concern  . Not on file  Social History Narrative  . Not on file     Physical Exam: Ht 4\' 10"  (1.473 m) Comment: height measured without shoes  Wt 115 lb (52.2 kg)   BMI 24.04 kg/m  Constitutional: generally well-appearing Psychiatric: alert and oriented x3 Eyes: extraocular movements intact Mouth: oral pharynx moist, no lesions Neck: supple no lymphadenopathy Cardiovascular: heart  regular rate and rhythm Lungs: clear to auscultation bilaterally Abdomen: soft, nontender, nondistended, no obvious ascites, no peritoneal signs, normal bowel sounds Extremities: no lower extremity edema bilaterally Skin: no lesions on visible extremities   Assessment and plan: 77 y.o. female with chronic GERD without alarm symptoms  Her symptoms are under much better control while taking proton pump inhibitor twice daily and nightly H2 blocker.  She is not taking any  of her 3 pills at the correct time in relation to meals and so I have modified her dosing regimen a bit.  I am also going to try her back on once daily proton pump inhibitor.  She will take it 20 to 30 minutes before breakfast meal.  She will continue taking H2 blocker in the evening but it is best if she take this immediately at bedtime.  She will return to see me in 2 months and sooner if any problems.  She does know that she should try not to eat her meals within 2 to 3 hours of laying down as well.    Please see the "Patient Instructions" section for addition details about the plan.   Owens Loffler, MD [facility] 08/17/2018, 3:11 PM  Cc: Idelle Crouch, MD

## 2018-08-17 NOTE — Patient Instructions (Addendum)
Protonix should be 40mg  once daily 20-30 min before breakfast. Ranitidine 300mg  should be at bedtime. Avoid eating within 2-3 hours of laying down. Please return to see Dr. Ardis Hughs in 2 months.  Normal BMI (Body Mass Index- based on height and weight) is between 23 and 30. Your BMI today is Body mass index is 24.04 kg/m. Marland Kitchen Please consider follow up  regarding your BMI with your Primary Care Provider.  Thank you for entrusting me with your care and choosing [facility].  Dr Ardis Hughs

## 2018-09-27 MED ORDER — FOLIC ACID 1 MG TABLET
ORAL_TABLET | 5 refills | 0.00000 days | Status: CP
Start: 2018-09-27 — End: ?

## 2018-10-04 ENCOUNTER — Ambulatory Visit: Admit: 2018-10-04 | Discharge: 2018-10-05 | Payer: MEDICARE | Attending: Dermatology | Primary: Dermatology

## 2018-10-04 DIAGNOSIS — L309 Dermatitis, unspecified: Principal | ICD-10-CM

## 2018-10-25 ENCOUNTER — Encounter: Payer: Self-pay | Admitting: Gastroenterology

## 2018-10-25 ENCOUNTER — Ambulatory Visit (INDEPENDENT_AMBULATORY_CARE_PROVIDER_SITE_OTHER): Payer: Medicare Other | Admitting: Gastroenterology

## 2018-10-25 VITALS — BP 160/82 | HR 94 | Ht 59.0 in | Wt 116.5 lb

## 2018-10-25 DIAGNOSIS — K219 Gastro-esophageal reflux disease without esophagitis: Secondary | ICD-10-CM

## 2018-10-25 NOTE — Progress Notes (Signed)
Review of pertinent gastrointestinal problems: 1. GERD.  Upper GI July 2019 "large volume reflux.  Small hiatal hernia."  Symptoms significantly improved on once daily proton pump inhibitor and bedtime H2 blocker.    HPI: This is a very pleasant 77 year old woman whom I last saw 2 months ago.  She is here with her husband today.   She is doing very well on Protonix 40 mg shortly before breakfast and 300 mg of Zantac at bedtime nightly.  On this regimen over the past 2 months she has had only 2 nights of GERD symptoms and both of those were at times of late evening meals.  One was after a hot dog from [employer].  No dysphasia.  No overt GI bleeding.  Chief complaint is GERD  ROS: complete GI ROS as described in HPI, all other review negative.  Constitutional:  No unintentional weight loss   Past Medical History:  Diagnosis Date  . Arthritis   . Asthma    uses inhaler just prior to surgery to avoid attack  . Back pain    from previous injury  . Depression    no current issue/treatment; situation  . Gallstones   . GERD (gastroesophageal reflux disease)   . Hiatal hernia    patient does NOT have nerve/muscle disease  . HLD (hyperlipidemia)   . HTN (hypertension)   . Kidney stones   . Knee pain   . 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 Pancreatitis   . Pneumonia   . Seizures ([place])   . Shortness of breath    with exertion  . Sinus problem    frequent infections/congestion    Past Surgical History:  Procedure Laterality Date  . ABDOMINAL HYSTERECTOMY    . APPENDECTOMY    . CARPAL TUNNEL RELEASE  10+ years ago   bilateral  . EYE SURGERY  3 yrs ago   bilateral cataracts  . FOOT OSTEOTOMY  6 weeks ago   Left foot: great, 2nd & 3rd  . FOOT OSTEOTOMY  5 years ago   Right great toe  . HAND SURGERY Bilateral 2011-most recent   multiple hand surgeries, 2 on left, 3 on right  . NASAL SINUS SURGERY  most recent  7-8 yrs ago   7 sinus surgeries   . TRIGGER FINGER RELEASE  11/19/2011   Procedure: RELEASE TRIGGER FINGER/A-1 PULLEY;  Surgeon: Wynonia Sours, MD;  Location: [facility];  Service: Orthopedics;  Laterality: Right;  release a-1 pulley right index finger and cyst removal  . WRIST GANGLION EXCISION  1980's   right    Current Outpatient Medications  Medication Sig Dispense Refill  . estrogens, conjugated, (PREMARIN) 1.25 MG tablet Take 1.25 mg by mouth daily.      . folic acid (FOLVITE) 1 MG tablet Take 3 tablets by mouth daily.    . hydrOXYzine (ATARAX/VISTARIL) 25 MG tablet Take 1 tablet by mouth 3 (three) times daily as needed.  3  . montelukast (SINGULAIR) 10 MG tablet Take 1 tablet by mouth at bedtime.    . pantoprazole (PROTONIX) 40 MG tablet Take 1 tablet by mouth daily.     . ranitidine (ZANTAC) 300 MG tablet Take 1 tablet by mouth at bedtime.     No current facility-administered medications for this visit.     Allergies as of 10/25/2018 - Review Complete 10/25/2018  Allergen Reaction Noted  . Cinobac [cinoxacin] Anaphylaxis 11/18/2011  . Contrast  media [iodinated diagnostic agents] Other (See Comments) 11/18/2011  . Latex Dermatitis 11/18/2011  . Sulfa antibiotics Itching 11/18/2011  . Other  09/28/2016    Family History  Problem Relation Age of Onset  . Anesthesia problems Father        "bad lungs" couldn't wake him up  . Heart disease Father   . Breast cancer Maternal Grandmother   . Breast cancer Maternal Aunt        x 2  . Breast cancer Cousin   . Pancreatic cancer Cousin   . Heart disease Paternal Uncle     Social History   Socioeconomic History  . Marital status: Married    Spouse name: Not on file  . Number of children: 1  . Years of education: Not on file  . Highest education level: Not on file  Occupational History  . Occupation: retired  Scientific laboratory technician  . Financial resource strain: Not on file  . Food insecurity:    Worry: Not on file     Inability: Not on file  . Transportation needs:    Medical: Not on file    Non-medical: Not on file  Tobacco Use  . Smoking status: Never Smoker  . Smokeless tobacco: Never Used  Substance and Sexual Activity  . Alcohol use: Yes    Alcohol/week: 0.0 - 1.0 standard drinks    Comment: wine once every 2-3 months  . Drug use: No  . Sexual activity: Not on file  Lifestyle  . Physical activity:    Days per week: Not on file    Minutes per session: Not on file  . Stress: Not on file  Relationships  . Social connections:    Talks on phone: Not on file    Gets together: Not on file    Attends religious service: Not on file    Active member of club or organization: Not on file    Attends meetings of clubs or organizations: Not on file    Relationship status: Not on file  . Intimate partner violence:    Fear of current or ex partner: Not on file    Emotionally abused: Not on file    Physically abused: Not on file    Forced sexual activity: Not on file  Other Topics Concern  . Not on file  Social History Narrative  . Not on file     Physical Exam: BP (!) 160/82   Pulse 94   Ht 4\' 11"  (1.499 m)   Wt 116 lb 8 oz (52.8 kg)   BMI 23.53 kg/m  Constitutional: generally well-appearing Psychiatric: alert and oriented x3 Abdomen: soft, nontender, nondistended, no obvious ascites, no peritoneal signs, normal bowel sounds No peripheral edema noted in lower extremities  Assessment and plan: 77 y.o. female with GERD  She will stay on proton pump inhibitor shortly before breakfast.  I recommended that she switch from ranitidine to famotidine and that she start with 20 mg famotidine pill at bedtime nightly.  She knows she can double this if the 20 mg is not working well enough.  She will call if she has any further questions or concerns.  Please see the "Patient Instructions" section for addition details about the plan.  Owens Loffler, MD [facility] 10/25/2018,  4:23 PM

## 2018-10-25 NOTE — Patient Instructions (Addendum)
Stop zantac. Start pepcid (famotidine) 20mg  pills, try one pill nightly for a week.  Can double to 40mg  if needed. Continue protonix 20-30 min before breakfast meal.  Call with any questions or concerns.  Thank you for entrusting me with your care and choosing [facility].  Dr Ardis Hughs

## 2018-11-10 DIAGNOSIS — E039 Hypothyroidism, unspecified: Secondary | ICD-10-CM | POA: Insufficient documentation

## 2018-12-04 IMAGING — RF DG UGI W/ HIGH DENSITY W/KUB
7 series · 13 of 24 positions shown · non-contrast
Comparison: Lumbar radiographs 04/11/2011. Acute abdominal series
12/14/2005.

CLINICAL DATA: 76-year-old female with chronic gastroesophageal
reflux disease. The extent of reflux is such that it severely
affects her Incess Mamin

EXAM:
UPPER GI SERIES WITH KUB
TECHNIQUE: After obtaining a scout radiograph a routine upper GI series was
performed using effervescent crystals and barium
FLUOROSCOPY TIME:  Fluoroscopy Time:  2 minutes 12 seconds
Radiation Exposure Index (if provided by the fluoroscopic device):
103 mGy
Number of Acquired Spot Images: 0

[Series 1: one shot · 0.14mm/px · 1 of 1 slices shown (1 of 4)]
[im 1/1]
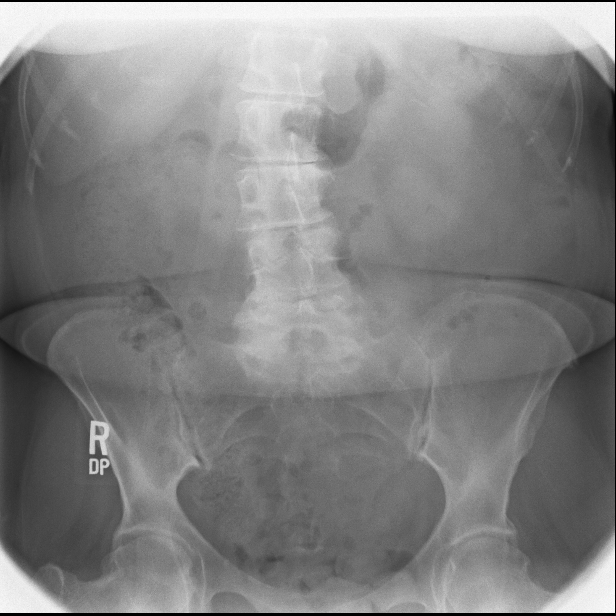

[Series 2: sequence · 1 of 18 frames shown (1 of 3)]
[frame 10/18]
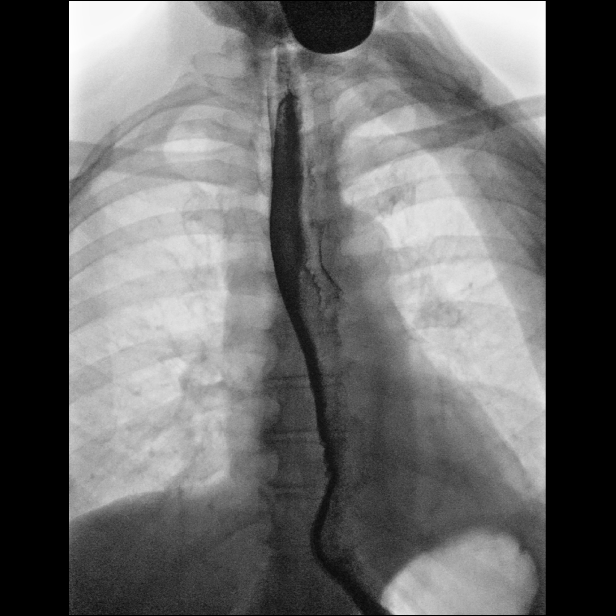

[Series 3: one shot · 6 of 25 slices shown (2 of 4)]
[im 2/25]
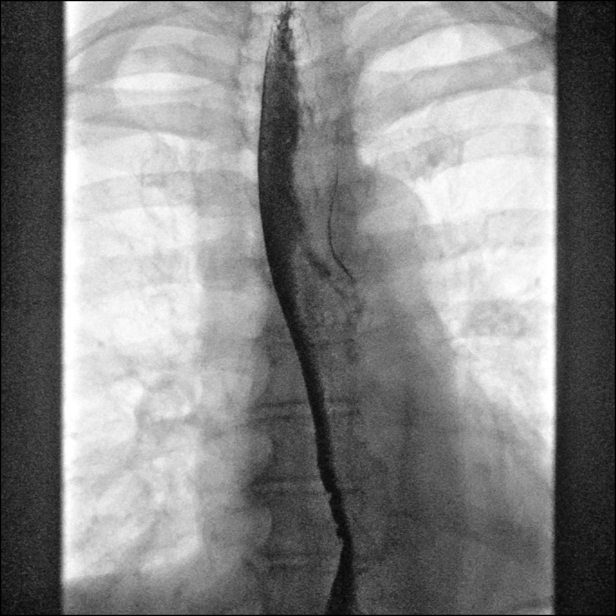
[im 6/25]
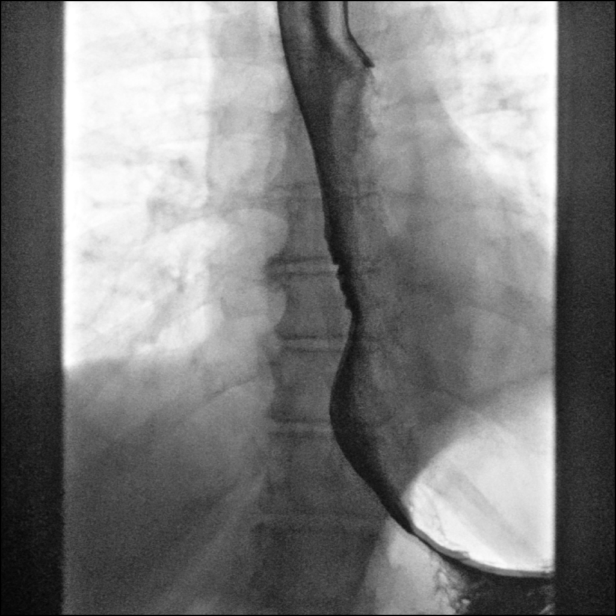
[im 11/25]
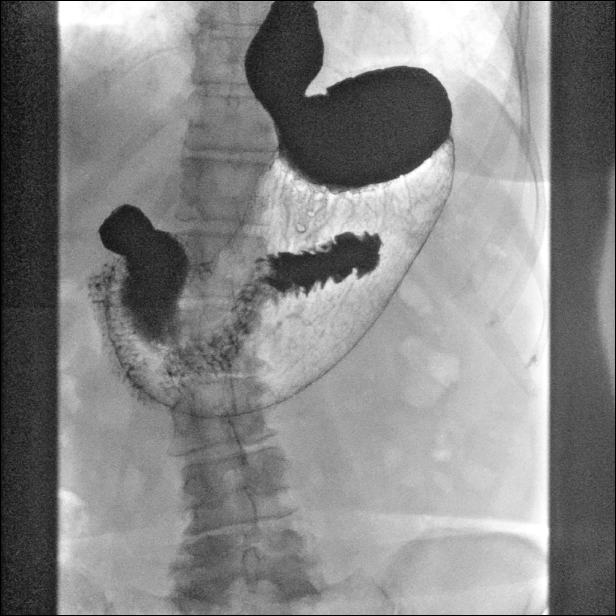
[im 16/25]
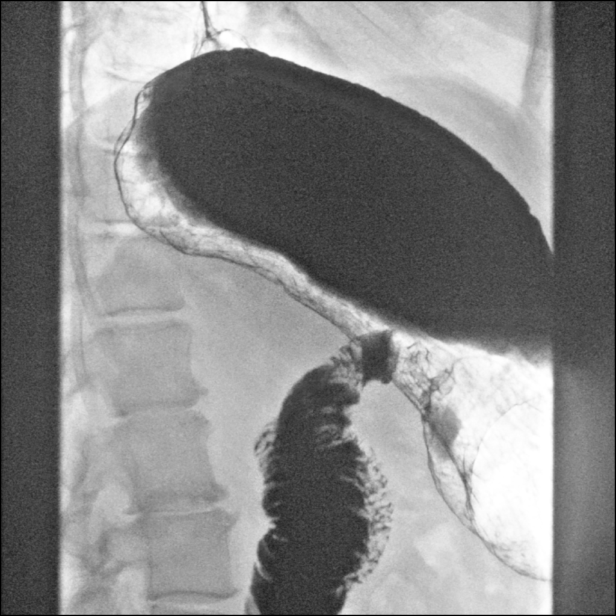
[im 21/25]
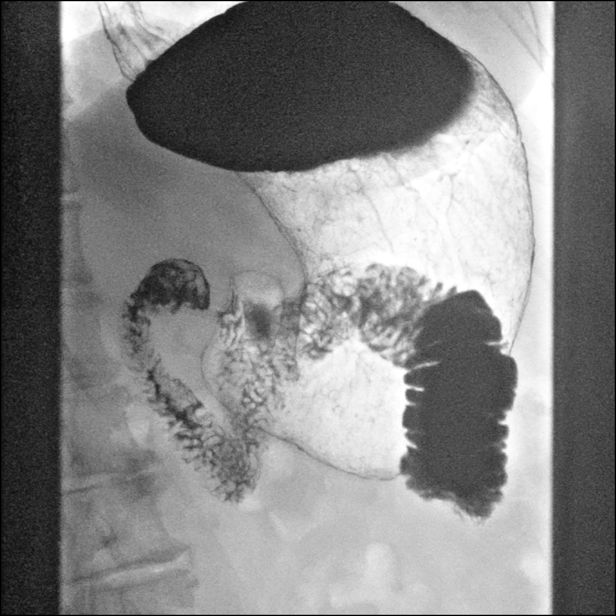
[im 23/25]
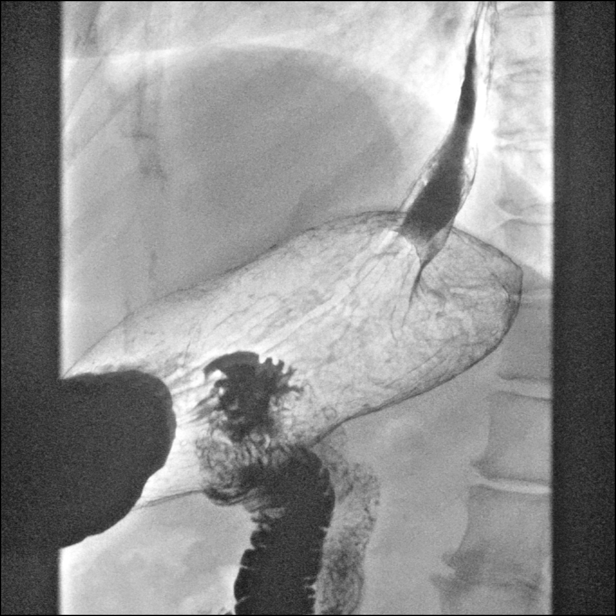

[Series 4: sequence · 1 of 26 frames shown (2 of 3)]
[frame 4/26]
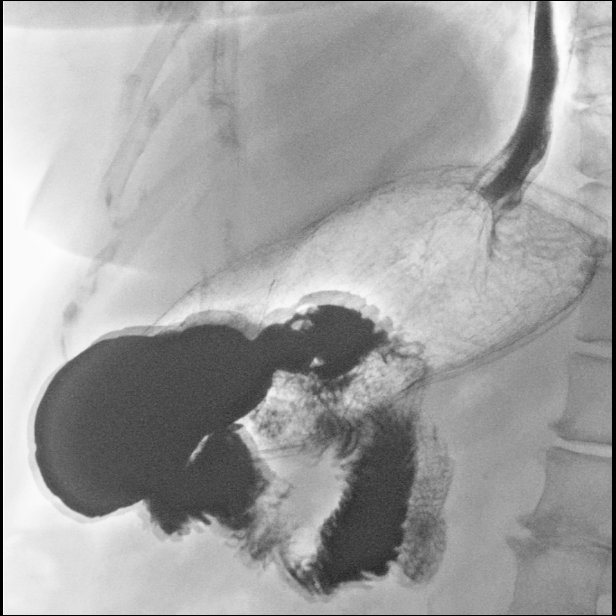

[Series 5: one shot · 1 of 4 slices shown (3 of 4)]
[im 1/4]
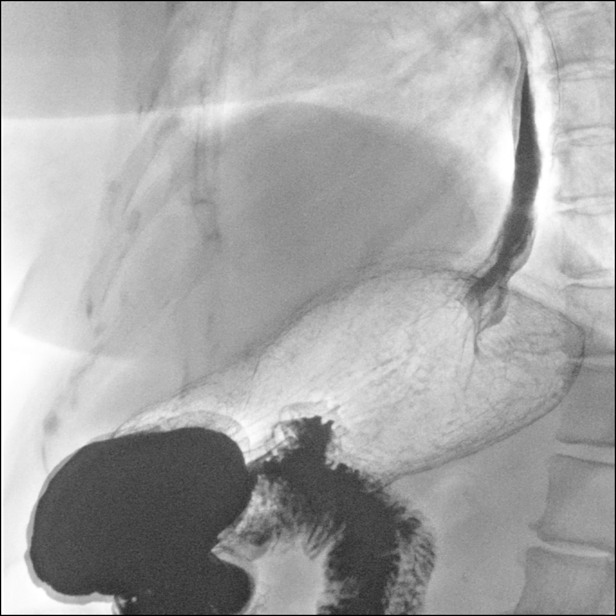

[Series 6: sequence · 2 of 45 frames shown (3 of 3)]
[frame 7/45]
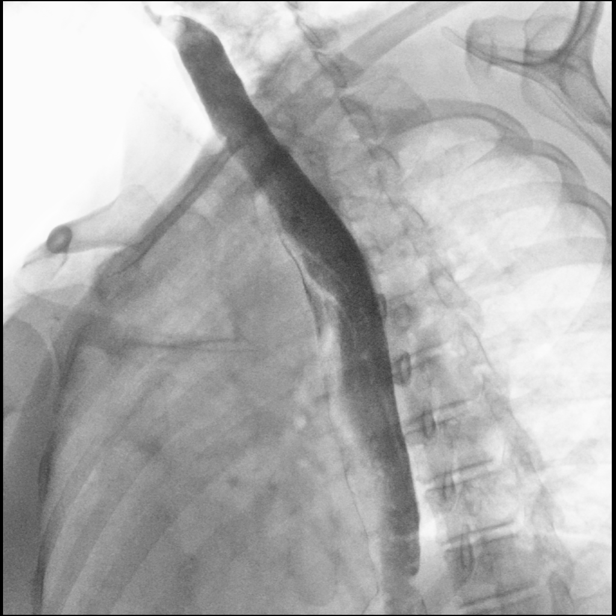
[frame 39/45]
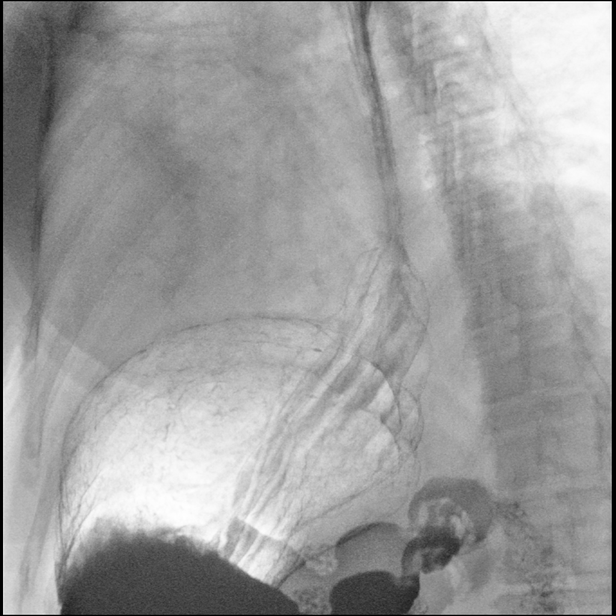

[Series 7: one shot · 1 of 5 slices shown (4 of 4)]
[im 5/5]
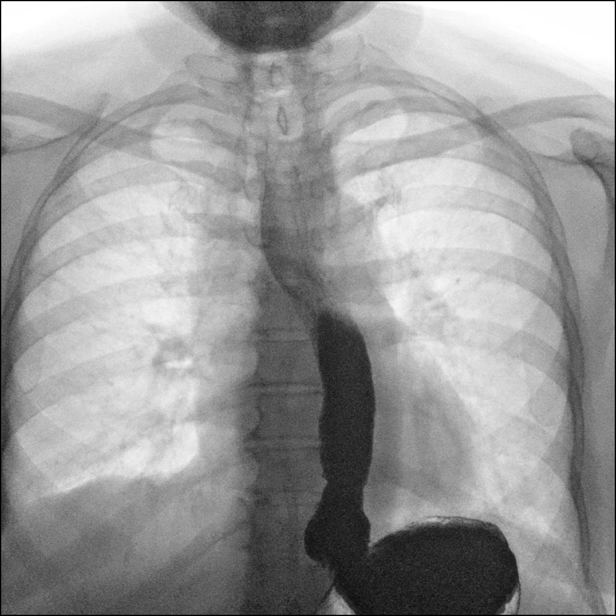

[13 of 24 positions shown; findings below may reference images not displayed]

FINDINGS: Preprocedural scout view of the abdomen demonstrates increased
degenerative dextroconvex lumbar spine curvature since 2102.
Multilevel lower lumbar disc space loss. Asymmetric loss of the
right hip joint space is also noted since 4550 with subchondral
sclerosis and osteophytosis. No acute osseous abnormality
identified. Normal visible bowel gas pattern and abdominal, pelvic
visceral contours.

A double contrast study was undertaken and the patient tolerated
this well and without difficulty.

No obstruction to the forward flow of contrast throughout the
esophagus and into the stomach. Patulous esophagus throughout.
Normal esophageal contour aside from frequent mild tertiary
contractions distally. Esophageal mucosal pattern is within normal
limits. A small hiatal hernia is suspected while standing.

As soon as the patient was brought supine spontaneous
gastroesophageal reflux to the thoracic inlet occurred, opacifying
the patulous esophagus. A small hiatal hernia is confirmed. The
distal esophageal tertiary contractions appear increased.

Prompt gastric emptying also occurred, with contrast at the ligament
of Treitz promptly.

Aside from the small hernia which is observed to slide in and out of
the hiatus (series 5), the gastric contour is normal. Gastric
mucosal pattern is normal.

Duodenum bulb and C loop are normal. Visible proximal small bowel
loops also appear normal.

With prone swallows there was generalized decreased esophageal
motility, but no tertiary contractions occurred. The hiatal hernia
is re- demonstrated.

Spontaneous gastroesophageal reflux was observed every time the
patient was supine.
IMPRESSION: 1. Relatively large volume of gastroesophageal reflux observed every
time the patient was supine.
2. Underlying small sliding-type gastric hiatal hernia. Otherwise
normal stomach.
3. Capacious esophagus with frequent tertiary contractions distally.
Mild generalized decreased esophageal motility.
4. Normal duodenum and proximal small bowel.

## 2018-12-10 ENCOUNTER — Encounter: Payer: Self-pay | Admitting: Internal Medicine

## 2018-12-10 ENCOUNTER — Ambulatory Visit (INDEPENDENT_AMBULATORY_CARE_PROVIDER_SITE_OTHER): Payer: Medicare Other | Admitting: Internal Medicine

## 2018-12-10 VITALS — BP 158/80 | HR 88 | Ht 59.0 in | Wt 115.2 lb

## 2018-12-10 DIAGNOSIS — R002 Palpitations: Secondary | ICD-10-CM | POA: Diagnosis not present

## 2018-12-10 DIAGNOSIS — R079 Chest pain, unspecified: Secondary | ICD-10-CM | POA: Diagnosis not present

## 2018-12-10 DIAGNOSIS — I10 Essential (primary) hypertension: Secondary | ICD-10-CM

## 2018-12-10 MED ORDER — NEBIVOLOL HCL 5 MG PO TABS: 5.0000 mg | ORAL_TABLET | Freq: Every day | ORAL | 0 refills | Status: DC

## 2018-12-10 NOTE — Progress Notes (Signed)
New Outpatient Visit Date: 12/10/2018  Referring Provider: Idelle Crouch, MD [facility] [place], [place] [address]  Chief Complaint: Chest pain  HPI:  [name] is a 78 y.o. female who is being seen today as a self-referral for evaluation of chest pain. She has a history of hypertension, hyperlipidemia, asthma, and chronic pancreatitis. [name] reports exertional chest pressure and shortness of breath for many years (even dating back to childhood when running).  It now occurs when she climbs 1/2 flight of stairs, often forcing her to rest.  Frequency and intensity have increased slightly over the last 1-2 years.  Approximately 3 months ago, she had an episode of severe substernal chest pain radiating to both arms that occurred while in bed.  The episode lasted about 20 minutes and subsided on its own.  She reports associated heaviness in both arms and shortness of breath.  The pain's intensity was 8-9/10; it was not positional.  She reports a similar episode in the remote past.  She has not had a recurrence over the last 3 months.  [name] reports an itchy rash of uncertain etiology, for which she has been on steroids and methotrexate.  She has stopped taking several medications, including her blood pressure meds, as she thought they may be contributing.  Rash/itching have improved somewhat but are still present.  [name] denies a history of heart disease.  She reports undergoing some sort of stress test over 5 years ago, which she believes was normal.  She notes a severe contrast reaction several years ago during a back imaging procedure (? CT myelogram).  [name] reports intermittent palpitations lasting up to a minute.  She feels like her heart races without warning.  She has associated chest pain and lightheadedness.  --------------------------------------------------------------------------------------------------  Cardiovascular History &  Procedures: Cardiovascular Problems:  Chest pain  Palpitations  Risk Factors:  Hypertension, hyperlipidemia, age > 54, and possible family history  Cath/PCI:  None  CV Surgery:  None  EP Procedures and Devices:  None  Non-Invasive Evaluation(s):  Report of negative stress test > 5 years ago  Recent CV Pertinent Labs: Lab Results  Component Value Date   K 2.9 (L) 09/28/2016   BUN 9 09/28/2016   CREATININE 0.79 09/28/2016    --------------------------------------------------------------------------------------------------  Past Medical History:  Diagnosis Date  . Arthritis   . Asthma    uses inhaler just prior to surgery to avoid attack  . Back pain    from previous injury  . Depression    no current issue/treatment; situation  . Gallstones   . GERD (gastroesophageal reflux disease)   . Hiatal hernia    patient does NOT have nerve/muscle disease  . HLD (hyperlipidemia)   . HTN (hypertension)   . Kidney stones   . Knee pain   . 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 Pancreatitis   . Pneumonia   . Seizures ([place])   . Shortness of breath    with exertion  . Sinus problem    frequent infections/congestion  . Thyroid disease     Past Surgical History:  Procedure Laterality Date  . ABDOMINAL HYSTERECTOMY    . APPENDECTOMY    . CARPAL TUNNEL RELEASE  10+ years ago   bilateral  . EYE SURGERY  3 yrs ago   bilateral cataracts  . FOOT OSTEOTOMY  6 weeks ago   Left foot: great, 2nd &  3rd  . FOOT OSTEOTOMY  5 years ago   Right great toe  . HAND SURGERY Bilateral 2011-most recent   multiple hand surgeries, 2 on left, 3 on right  . NASAL SINUS SURGERY  most recent 7-8 yrs ago   7 sinus surgeries   . TRIGGER FINGER RELEASE  11/19/2011   Procedure: RELEASE TRIGGER FINGER/A-1 PULLEY;  Surgeon: Wynonia Sours, MD;  Location: [facility];  Service: Orthopedics;  Laterality: Right;   release a-1 pulley right index finger and cyst removal  . WRIST GANGLION EXCISION  1980's   right    Current Meds  Medication Sig  . albuterol (PROVENTIL HFA;VENTOLIN HFA) 108 (90 Base) MCG/ACT inhaler Inhale into the lungs as needed.  Marland Kitchen aspirin EC 81 MG tablet Take 81 mg by mouth as needed.  Marland Kitchen estrogens, conjugated, (PREMARIN) 1.25 MG tablet Take 1.25 mg by mouth daily.    . folic acid (FOLVITE) 1 MG tablet Take 3 tablets by mouth daily.  Marland Kitchen levothyroxine (SYNTHROID, LEVOTHROID) 50 MCG tablet Take 50 mcg by mouth daily before breakfast.  . montelukast (SINGULAIR) 10 MG tablet Take 1 tablet by mouth at bedtime.  . pantoprazole (PROTONIX) 40 MG tablet Take 1 tablet by mouth daily.     Allergies: Cinobac [cinoxacin]; Contrast media [iodinated diagnostic agents]; Latex; Sulfa antibiotics; and Other  Social History   Tobacco Use  . Smoking status: Never Smoker  . Smokeless tobacco: Never Used  Substance Use Topics  . Alcohol use: Not Currently    Alcohol/week: 0.0 - 1.0 standard drinks    Comment: wine once every 2-3 months  . Drug use: No    Family History  Problem Relation Age of Onset  . Anesthesia problems Father        "bad lungs" couldn't wake him up  . Heart disease Father   . Heart attack Father   . Breast cancer Maternal Grandmother   . Breast cancer Maternal Aunt        x 2  . Breast cancer Cousin   . Pancreatic cancer Cousin   . Heart disease Paternal Uncle     Review of Systems: A 12-system review of systems was performed and was negative except as noted in the HPI.  --------------------------------------------------------------------------------------------------  Physical Exam: BP (!) 158/80 (BP Location: Right Arm, Patient Position: Sitting, Cuff Size: Normal)   Pulse 88   Ht 4\' 11"  (1.499 m)   Wt 115 lb 4 oz (52.3 kg)   BMI 23.28 kg/m   General:  NAD.  Accompanied by her husband HEENT: No conjunctival pallor or scleral icterus. Moist mucous  membranes. OP clear. Neck: Supple without lymphadenopathy, thyromegaly, JVD, or HJR. No carotid bruit. Lungs: Normal work of breathing. Clear to auscultation bilaterally without wheezes or crackles though the patient frequently coughs after taking a deep breath Heart: Regular rate and rhythm without murmurs, rubs, or gallops. Non-displaced PMI. Abd: Bowel sounds present. Soft, NT/ND without hepatosplenomegaly Ext: No lower extremity edema. Radial, PT, and DP pulses are 2+ bilaterally Skin: Warm and dry.  Multiple erythematous patches on plaques on torso, extremities, and face with areas of excoriation. Neuro: CNIII-XII intact. Strength and fine-touch sensation intact in upper and lower extremities bilaterally. Psych: Normal mood and affect.  EKG:  NSR without abnormality  Lab Results  Component Value Date   WBC 11.4 (H) 09/28/2016   HGB 12.3 09/28/2016   HCT 36.3 09/28/2016   MCV 86.5 09/28/2016   PLT 320 09/28/2016  Lab Results  Component Value Date   NA 138 09/28/2016   K 2.9 (L) 09/28/2016   CL 105 09/28/2016   CO2 25 09/28/2016   BUN 9 09/28/2016   CREATININE 0.79 09/28/2016   GLUCOSE 107 (H) 09/28/2016   ALT 18 09/28/2016    No results found for: CHOL, HDL, LDLCALC, LDLDIRECT, TRIG, CHOLHDL   --------------------------------------------------------------------------------------------------  ASSESSMENT AND PLAN: Chest pain There are both typical and atypical features associated with her pain.  She has a long history of personal chest tightness shortness of breath dating back to adolescence this has worsened slightly over the last 1 to 2 years.  Certainly this is concerning for coronary insufficiency, though her history of asthma may also be playing a role.  Severe episode of pain at rest 3 months ago is nonspecific.  EKG and examination today are unrevealing other than modestly elevated blood pressure.  We have discussed further evaluation options.  Given the limited  mobility due to breathing and orthopedic issues, we will proceed with a pharmacologic myocardial perfusion stress test.  Given history of possible contrast reaction, we will try to avoid CT and conventional coronary angiography, if possible.  We have agreed to start Devall 5 mg daily for blood pressure control and some degree of antianginal effect.  Ms. Vilar should continue current doses of aspirin and simvastatin.  Hypertension Blood pressure is not well controlled today, possibly due to discontinuation of her antihypertensive medications out of concern that they may have contributed to her itching and rash.  We have agreed to start nebivolol 5 mg daily.  Palpitations Brief and intermittent.  Ambulatory event monitoring may be beneficial.  However, we will defer this until after the aforementioned myocardial perfusion stress test.  Addition of nebivolol may help as well.  Follow-up: Return to clinic in 1 month.  Nelva Bush, MD 12/10/2018 2:52 PM

## 2018-12-10 NOTE — Patient Instructions (Addendum)
Medication Instructions:  START Bystolic 5 mg tablet daily (3 boxes of samples given)  If you need a refill on your cardiac medications before your next appointment, please call your pharmacy.   Lab work: None ordered  Testing/Procedures: Your physician has requested that you have a lexiscan myoview. For further information please visit [email or web address]. Please follow instruction sheet, as given.   Follow-Up: At [facility], you and your health needs are our priority.  As part of our continuing mission to provide you with exceptional heart care, we have created designated Provider Care Teams.  These Care Teams include your primary Cardiologist (physician) and Advanced Practice Providers (APPs -  Physician Assistants and Nurse Practitioners) who all work together to provide you with the care you need, when you need it. You will need a follow up appointment in 1 month.  You may see Dr. Saunders Revel or one of the following Advanced Practice Providers on your designated Care Team:   Murray Hodgkins, NP Christell Faith, PA-C . Marrianne Mood, PA-C  Any Other Special Instructions Will Be Listed Below (If Applicable).  [place]  Your provider has ordered a Stress Test with nuclear imaging. The purpose of this test is to evaluate the blood supply to your heart muscle. This procedure is referred to as a "Non-Invasive Stress Test." This is because other than having an IV started in your vein, nothing is inserted or "invades" your body. Cardiac stress tests are done to find areas of poor blood flow to the heart by determining the extent of coronary artery disease (CAD). Some patients exercise on a treadmill, which naturally increases the blood flow to your heart, while others who are unable to walk on a treadmill due to physical limitations have a pharmacologic/chemical stress agent called Lexiscan . This medicine will mimic walking on a treadmill by temporarily increasing your coronary blood flow.    Please note: these test may take anywhere between 2-4 hours to complete  PLEASE REPORT TO [facility] AT THE FIRST DESK WILL DIRECT YOU WHERE TO GO  Date of Procedure:_____________________________________  Arrival Time for Procedure:______________________________  Instructions regarding medication:  Nothing to hold  PLEASE NOTIFY THE OFFICE AT LEAST 24 HOURS IN ADVANCE IF YOU ARE UNABLE TO KEEP YOUR APPOINTMENT.  [phone] AND  PLEASE NOTIFY NUCLEAR MEDICINE AT [facility] AT LEAST 24 HOURS IN ADVANCE IF YOU ARE UNABLE TO KEEP YOUR APPOINTMENT. [phone]  How to prepare for your Myoview test:  1. Do not eat or drink after midnight 2. No caffeine for 24 hours prior to test 3. No smoking 24 hours prior to test. 4. Your medication may be taken with water.  If your doctor stopped a medication because of this test, do not take that medication. 5. Ladies, please do not wear dresses.  Skirts or pants are appropriate. Please wear a short sleeve shirt. 6. No perfume, cologne or lotion. 7. Wear comfortable walking shoes. No heels!

## 2018-12-11 ENCOUNTER — Encounter: Payer: Self-pay | Admitting: Internal Medicine

## 2018-12-11 DIAGNOSIS — R079 Chest pain, unspecified: Secondary | ICD-10-CM | POA: Insufficient documentation

## 2018-12-11 DIAGNOSIS — R002 Palpitations: Secondary | ICD-10-CM | POA: Insufficient documentation

## 2018-12-11 DIAGNOSIS — I10 Essential (primary) hypertension: Secondary | ICD-10-CM | POA: Insufficient documentation

## 2018-12-14 ENCOUNTER — Ambulatory Visit
Admission: RE | Admit: 2018-12-14 | Discharge: 2018-12-14 | Disposition: A | Discharge status: Home / Self Care | Payer: Medicare Other | Source: Ambulatory Visit | Attending: Internal Medicine | Admitting: Internal Medicine

## 2018-12-14 DIAGNOSIS — R079 Chest pain, unspecified: Secondary | ICD-10-CM | POA: Diagnosis not present

## 2018-12-14 LAB — NM MYOCAR MULTI W/SPECT W/WALL MOTION / EF
CHL CUP MPHR: 144 {beats}/min
CSEPHR: 68 %
LV sys vol: 26 mL
LVDIAVOL: 55 mL (ref 46–106)
NUC STRESS TID: 1.02
Peak HR: 99 {beats}/min
Rest HR: 74 {beats}/min

## 2018-12-14 MED ORDER — TECHNETIUM TC 99M TETROFOSMIN IV KIT
32.3130 | PACK | Freq: Once | INTRAVENOUS | Status: AC | PRN
  Administered 2018-12-14: 32.313 via INTRAVENOUS

## 2018-12-14 MED ORDER — TECHNETIUM TC 99M TETROFOSMIN IV KIT
10.0000 | PACK | Freq: Once | INTRAVENOUS | Status: AC | PRN
  Administered 2018-12-14: 10.26 via INTRAVENOUS

## 2018-12-14 MED ORDER — REGADENOSON 0.4 MG/5ML IV SOLN
0.4000 mg | Freq: Once | INTRAVENOUS | Status: AC
  Administered 2018-12-14: 0.4 mg via INTRAVENOUS

## 2018-12-17 ENCOUNTER — Encounter

## 2018-12-17 ENCOUNTER — Ambulatory Visit: Payer: Medicare Other | Admitting: Cardiovascular Disease

## 2019-01-13 ENCOUNTER — Ambulatory Visit: Payer: PRIVATE HEALTH INSURANCE | Admitting: Physician Assistant

## 2019-02-01 NOTE — Progress Notes (Deleted)
Cardiology Office Note Date:  02/01/2019  Patient ID:  [name], [name] 01-14-1941, MRN [id number] PCP:  Idelle Crouch, MD  Cardiologist:  Dr. Saunders Revel, MD  ***refresh   Chief Complaint: Follow up  History of Present Illness: [name] is a 78 y.o. female with history of ***   Past Medical History:  Diagnosis Date  . Arthritis   . Asthma    uses inhaler just prior to surgery to avoid attack  . Back pain    from previous injury  . Depression    no current issue/treatment; situation  . Gallstones   . GERD (gastroesophageal reflux disease)   . Hiatal hernia    patient does NOT have nerve/muscle disease  . HLD (hyperlipidemia)   . HTN (hypertension)   . Kidney stones   . Knee pain   . 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 Pancreatitis   . Pneumonia   . Seizures ([place])   . Shortness of breath    with exertion  . Sinus problem    frequent infections/congestion  . Thyroid disease     Past Surgical History:  Procedure Laterality Date  . ABDOMINAL HYSTERECTOMY    . APPENDECTOMY    . CARPAL TUNNEL RELEASE  10+ years ago   bilateral  . EYE SURGERY  3 yrs ago   bilateral cataracts  . FOOT OSTEOTOMY  6 weeks ago   Left foot: great, 2nd & 3rd  . FOOT OSTEOTOMY  5 years ago   Right great toe  . HAND SURGERY Bilateral 2011-most recent   multiple hand surgeries, 2 on left, 3 on right  . NASAL SINUS SURGERY  most recent 7-8 yrs ago   7 sinus surgeries   . TRIGGER FINGER RELEASE  11/19/2011   Procedure: RELEASE TRIGGER FINGER/A-1 PULLEY;  Surgeon: Wynonia Sours, MD;  Location: [facility];  Service: Orthopedics;  Laterality: Right;  release a-1 pulley right index finger and cyst removal  . WRIST GANGLION EXCISION  1980's   right    No outpatient medications have been marked as taking for the 02/03/19 encounter (Appointment) with Rise Mu, PA-C.    Allergies:   Cinobac [cinoxacin];  Contrast media [iodinated diagnostic agents]; Latex; Sulfa antibiotics; and Other   Social History:  The patient  reports that she has never smoked. She has never used smokeless tobacco. She reports previous alcohol use. She reports that she does not use drugs.   Family History:  The patient's family history includes Anesthesia problems in her father; Breast cancer in her cousin, maternal aunt, and maternal grandmother; Heart attack in her paternal uncle; Heart attack (age of onset: 91) in her father; Heart disease in her father; Pancreatic cancer in her cousin; Stroke in her paternal grandfather.  ROS:   ROS   PHYSICAL EXAM: *** VS:  There were no vitals taken for this visit. BMI: There is no height or weight on file to calculate BMI.  Physical Exam   EKG:  Was ordered and interpreted by me today. Shows ***  Recent Labs: No results found for requested labs within last 8760 hours.  No results found for requested labs within last 8760 hours.   CrCl cannot be calculated (Patient's most recent lab result is older than the maximum 21 days allowed.).   Wt Readings from Last 3 Encounters:  12/10/18 115 lb 4 oz (52.3 kg)  10/25/18 116 lb 8  oz (52.8 kg)  08/17/18 115 lb (52.2 kg)     Other studies reviewed: Additional studies/records reviewed today include: summarized above  ASSESSMENT AND PLAN:  1. ***  Disposition: F/u with Dr. Saunders Revel or an APP in ***  Current medicines are reviewed at length with the patient today.  The patient did not have any concerns regarding medicines.  Signed, Christell Faith, PA-C 02/01/2019 7:55 AM     [facility] [address] [place], [place] [address] [phone]

## 2019-02-03 ENCOUNTER — Ambulatory Visit: Payer: PRIVATE HEALTH INSURANCE | Admitting: Physician Assistant

## 2019-10-19 ENCOUNTER — Other Ambulatory Visit: Payer: Self-pay | Admitting: Family Medicine

## 2019-10-19 ENCOUNTER — Telehealth: Payer: Self-pay | Admitting: *Deleted

## 2019-10-19 DIAGNOSIS — G8929 Other chronic pain: Secondary | ICD-10-CM

## 2019-10-19 DIAGNOSIS — R202 Paresthesia of skin: Secondary | ICD-10-CM

## 2019-10-24 ENCOUNTER — Telehealth: Payer: Self-pay | Admitting: *Deleted

## 2019-11-23 ENCOUNTER — Ambulatory Visit
Admission: RE | Admit: 2019-11-23 | Discharge: 2019-11-23 | Disposition: A | Discharge status: Home / Self Care | Payer: Medicare Other | Source: Ambulatory Visit | Attending: Family Medicine | Admitting: Family Medicine

## 2019-11-23 ENCOUNTER — Other Ambulatory Visit: Payer: Self-pay

## 2019-11-23 DIAGNOSIS — R519 Headache, unspecified: Secondary | ICD-10-CM | POA: Insufficient documentation

## 2019-11-23 DIAGNOSIS — G8929 Other chronic pain: Secondary | ICD-10-CM | POA: Insufficient documentation

## 2019-11-23 DIAGNOSIS — R202 Paresthesia of skin: Secondary | ICD-10-CM | POA: Diagnosis present

## 2019-12-09 DIAGNOSIS — I639 Cerebral infarction, unspecified: Secondary | ICD-10-CM

## 2019-12-09 HISTORY — DX: Cerebral infarction, unspecified: I63.9

## 2019-12-13 DIAGNOSIS — M2550 Pain in unspecified joint: Secondary | ICD-10-CM | POA: Insufficient documentation

## 2019-12-13 DIAGNOSIS — R768 Other specified abnormal immunological findings in serum: Secondary | ICD-10-CM | POA: Diagnosis not present

## 2019-12-13 DIAGNOSIS — M19041 Primary osteoarthritis, right hand: Secondary | ICD-10-CM | POA: Diagnosis not present

## 2019-12-13 DIAGNOSIS — L309 Dermatitis, unspecified: Secondary | ICD-10-CM | POA: Insufficient documentation

## 2019-12-13 DIAGNOSIS — M19042 Primary osteoarthritis, left hand: Secondary | ICD-10-CM | POA: Diagnosis not present

## 2019-12-21 DIAGNOSIS — F419 Anxiety disorder, unspecified: Secondary | ICD-10-CM | POA: Diagnosis not present

## 2019-12-21 DIAGNOSIS — R768 Other specified abnormal immunological findings in serum: Secondary | ICD-10-CM | POA: Insufficient documentation

## 2019-12-21 DIAGNOSIS — E079 Disorder of thyroid, unspecified: Secondary | ICD-10-CM | POA: Diagnosis not present

## 2019-12-21 DIAGNOSIS — M25541 Pain in joints of right hand: Secondary | ICD-10-CM | POA: Diagnosis not present

## 2019-12-21 DIAGNOSIS — Z0000 Encounter for general adult medical examination without abnormal findings: Secondary | ICD-10-CM | POA: Diagnosis not present

## 2019-12-21 DIAGNOSIS — Z79899 Other long term (current) drug therapy: Secondary | ICD-10-CM | POA: Diagnosis not present

## 2019-12-21 DIAGNOSIS — M25542 Pain in joints of left hand: Secondary | ICD-10-CM | POA: Diagnosis not present

## 2019-12-21 DIAGNOSIS — R7681 Abnormal rheumatoid factor and anti-citrullinated protein antibody without rheumatoid arthritis: Secondary | ICD-10-CM | POA: Insufficient documentation

## 2019-12-21 DIAGNOSIS — E785 Hyperlipidemia, unspecified: Secondary | ICD-10-CM | POA: Diagnosis not present

## 2019-12-21 DIAGNOSIS — I10 Essential (primary) hypertension: Secondary | ICD-10-CM | POA: Diagnosis not present

## 2019-12-26 DIAGNOSIS — R5382 Chronic fatigue, unspecified: Secondary | ICD-10-CM | POA: Diagnosis not present

## 2019-12-26 DIAGNOSIS — R05 Cough: Secondary | ICD-10-CM | POA: Diagnosis not present

## 2019-12-26 DIAGNOSIS — L299 Pruritus, unspecified: Secondary | ICD-10-CM | POA: Diagnosis not present

## 2020-01-12 DIAGNOSIS — H539 Unspecified visual disturbance: Secondary | ICD-10-CM | POA: Diagnosis not present

## 2020-01-12 DIAGNOSIS — R4789 Other speech disturbances: Secondary | ICD-10-CM | POA: Diagnosis not present

## 2020-01-12 DIAGNOSIS — R569 Unspecified convulsions: Secondary | ICD-10-CM | POA: Diagnosis not present

## 2020-01-13 ENCOUNTER — Other Ambulatory Visit: Payer: Self-pay | Admitting: Neurology

## 2020-01-13 DIAGNOSIS — R569 Unspecified convulsions: Secondary | ICD-10-CM

## 2020-01-24 ENCOUNTER — Ambulatory Visit
Admission: RE | Admit: 2020-01-24 | Discharge: 2020-01-24 | Disposition: A | Discharge status: Home / Self Care | Payer: Medicare HMO | Source: Ambulatory Visit | Attending: Neurology | Admitting: Neurology

## 2020-01-24 ENCOUNTER — Other Ambulatory Visit: Payer: Self-pay

## 2020-01-24 DIAGNOSIS — I639 Cerebral infarction, unspecified: Secondary | ICD-10-CM | POA: Diagnosis not present

## 2020-01-24 DIAGNOSIS — R569 Unspecified convulsions: Secondary | ICD-10-CM | POA: Diagnosis not present

## 2020-01-24 MED ORDER — GADOBUTROL 1 MMOL/ML IV SOLN
5.0000 mL | Freq: Once | INTRAVENOUS | Status: AC | PRN
  Administered 2020-01-24: 15:00:00 5 mL via INTRAVENOUS

## 2020-01-30 DIAGNOSIS — G43119 Migraine with aura, intractable, without status migrainosus: Secondary | ICD-10-CM | POA: Diagnosis not present

## 2020-01-30 DIAGNOSIS — R200 Anesthesia of skin: Secondary | ICD-10-CM | POA: Diagnosis not present

## 2020-01-30 DIAGNOSIS — E559 Vitamin D deficiency, unspecified: Secondary | ICD-10-CM | POA: Diagnosis not present

## 2020-01-30 DIAGNOSIS — R2689 Other abnormalities of gait and mobility: Secondary | ICD-10-CM | POA: Diagnosis not present

## 2020-01-30 DIAGNOSIS — E531 Pyridoxine deficiency: Secondary | ICD-10-CM | POA: Diagnosis not present

## 2020-01-30 DIAGNOSIS — Z8673 Personal history of transient ischemic attack (TIA), and cerebral infarction without residual deficits: Secondary | ICD-10-CM | POA: Diagnosis not present

## 2020-01-30 DIAGNOSIS — R202 Paresthesia of skin: Secondary | ICD-10-CM | POA: Diagnosis not present

## 2020-01-30 DIAGNOSIS — E538 Deficiency of other specified B group vitamins: Secondary | ICD-10-CM | POA: Diagnosis not present

## 2020-01-30 DIAGNOSIS — E519 Thiamine deficiency, unspecified: Secondary | ICD-10-CM | POA: Diagnosis not present

## 2020-02-01 DIAGNOSIS — Z7289 Other problems related to lifestyle: Secondary | ICD-10-CM | POA: Diagnosis not present

## 2020-02-01 DIAGNOSIS — M25541 Pain in joints of right hand: Secondary | ICD-10-CM | POA: Diagnosis not present

## 2020-02-01 DIAGNOSIS — I10 Essential (primary) hypertension: Secondary | ICD-10-CM | POA: Diagnosis not present

## 2020-02-01 DIAGNOSIS — M25542 Pain in joints of left hand: Secondary | ICD-10-CM | POA: Diagnosis not present

## 2020-02-20 DIAGNOSIS — R569 Unspecified convulsions: Secondary | ICD-10-CM | POA: Diagnosis not present

## 2020-02-22 DIAGNOSIS — R05 Cough: Secondary | ICD-10-CM | POA: Diagnosis not present

## 2020-02-22 DIAGNOSIS — J4520 Mild intermittent asthma, uncomplicated: Secondary | ICD-10-CM | POA: Diagnosis not present

## 2020-02-22 DIAGNOSIS — K219 Gastro-esophageal reflux disease without esophagitis: Secondary | ICD-10-CM | POA: Diagnosis not present

## 2020-02-22 DIAGNOSIS — J310 Chronic rhinitis: Secondary | ICD-10-CM | POA: Diagnosis not present

## 2020-02-24 DIAGNOSIS — R202 Paresthesia of skin: Secondary | ICD-10-CM | POA: Diagnosis not present

## 2020-02-24 DIAGNOSIS — R200 Anesthesia of skin: Secondary | ICD-10-CM | POA: Diagnosis not present

## 2020-02-27 DIAGNOSIS — I10 Essential (primary) hypertension: Secondary | ICD-10-CM | POA: Diagnosis not present

## 2020-02-27 DIAGNOSIS — R002 Palpitations: Secondary | ICD-10-CM | POA: Diagnosis not present

## 2020-02-27 DIAGNOSIS — I639 Cerebral infarction, unspecified: Secondary | ICD-10-CM | POA: Diagnosis not present

## 2020-03-06 DIAGNOSIS — E538 Deficiency of other specified B group vitamins: Secondary | ICD-10-CM | POA: Insufficient documentation

## 2020-03-06 DIAGNOSIS — E781 Pure hyperglyceridemia: Secondary | ICD-10-CM | POA: Diagnosis not present

## 2020-03-06 DIAGNOSIS — R002 Palpitations: Secondary | ICD-10-CM | POA: Diagnosis not present

## 2020-03-06 DIAGNOSIS — E039 Hypothyroidism, unspecified: Secondary | ICD-10-CM | POA: Diagnosis not present

## 2020-03-06 DIAGNOSIS — Z79899 Other long term (current) drug therapy: Secondary | ICD-10-CM | POA: Diagnosis not present

## 2020-03-06 DIAGNOSIS — I10 Essential (primary) hypertension: Secondary | ICD-10-CM | POA: Diagnosis not present

## 2020-03-21 DIAGNOSIS — M47816 Spondylosis without myelopathy or radiculopathy, lumbar region: Secondary | ICD-10-CM | POA: Diagnosis not present

## 2020-03-21 DIAGNOSIS — M545 Low back pain: Secondary | ICD-10-CM | POA: Diagnosis not present

## 2020-03-21 DIAGNOSIS — M79604 Pain in right leg: Secondary | ICD-10-CM | POA: Insufficient documentation

## 2020-03-21 DIAGNOSIS — R768 Other specified abnormal immunological findings in serum: Secondary | ICD-10-CM | POA: Diagnosis not present

## 2020-03-26 DIAGNOSIS — Z8673 Personal history of transient ischemic attack (TIA), and cerebral infarction without residual deficits: Secondary | ICD-10-CM | POA: Diagnosis not present

## 2020-03-26 DIAGNOSIS — R002 Palpitations: Secondary | ICD-10-CM | POA: Diagnosis not present

## 2020-03-26 DIAGNOSIS — I10 Essential (primary) hypertension: Secondary | ICD-10-CM | POA: Diagnosis not present

## 2020-04-06 DIAGNOSIS — M545 Low back pain: Secondary | ICD-10-CM | POA: Diagnosis not present

## 2020-04-11 DIAGNOSIS — M545 Low back pain: Secondary | ICD-10-CM | POA: Diagnosis not present

## 2020-04-12 DIAGNOSIS — M4306 Spondylolysis, lumbar region: Secondary | ICD-10-CM | POA: Insufficient documentation

## 2020-04-12 DIAGNOSIS — R768 Other specified abnormal immunological findings in serum: Secondary | ICD-10-CM | POA: Diagnosis not present

## 2020-04-18 DIAGNOSIS — M545 Low back pain: Secondary | ICD-10-CM | POA: Diagnosis not present

## 2020-04-23 DIAGNOSIS — J4520 Mild intermittent asthma, uncomplicated: Secondary | ICD-10-CM | POA: Diagnosis not present

## 2020-04-23 DIAGNOSIS — Z8673 Personal history of transient ischemic attack (TIA), and cerebral infarction without residual deficits: Secondary | ICD-10-CM | POA: Diagnosis not present

## 2020-04-23 DIAGNOSIS — I10 Essential (primary) hypertension: Secondary | ICD-10-CM | POA: Diagnosis not present

## 2020-04-25 DIAGNOSIS — M545 Low back pain: Secondary | ICD-10-CM | POA: Diagnosis not present

## 2020-04-30 ENCOUNTER — Other Ambulatory Visit
Admission: RE | Admit: 2020-04-30 | Discharge: 2020-04-30 | Disposition: A | Discharge status: Home / Self Care | Payer: Medicare HMO | Source: Ambulatory Visit | Attending: Cardiology | Admitting: Cardiology

## 2020-04-30 ENCOUNTER — Other Ambulatory Visit: Payer: Self-pay

## 2020-04-30 DIAGNOSIS — Z20822 Contact with and (suspected) exposure to covid-19: Secondary | ICD-10-CM | POA: Insufficient documentation

## 2020-04-30 DIAGNOSIS — Z01812 Encounter for preprocedural laboratory examination: Secondary | ICD-10-CM | POA: Diagnosis not present

## 2020-04-30 LAB — SARS CORONAVIRUS 2 (TAT 6-24 HRS): SARS Coronavirus 2: NEGATIVE

## 2020-05-02 ENCOUNTER — Ambulatory Visit: Admission: RE | Admit: 2020-05-02 | Payer: Medicare HMO | Source: Home / Self Care | Admitting: Cardiology

## 2020-05-02 ENCOUNTER — Encounter: Admission: RE | Payer: Self-pay | Source: Home / Self Care

## 2020-05-02 SURGERY — LOOP RECORDER INSERTION
Anesthesia: LOCAL

## 2020-05-10 ENCOUNTER — Other Ambulatory Visit: Payer: Self-pay | Admitting: Cardiology

## 2020-05-10 DIAGNOSIS — Z01818 Encounter for other preprocedural examination: Secondary | ICD-10-CM

## 2020-05-14 ENCOUNTER — Other Ambulatory Visit
Admission: RE | Admit: 2020-05-14 | Discharge: 2020-05-14 | Disposition: A | Discharge status: Home / Self Care | Payer: Medicare HMO | Source: Ambulatory Visit | Attending: Cardiology | Admitting: Cardiology

## 2020-05-14 ENCOUNTER — Other Ambulatory Visit: Payer: Self-pay

## 2020-05-14 DIAGNOSIS — Z20822 Contact with and (suspected) exposure to covid-19: Secondary | ICD-10-CM | POA: Insufficient documentation

## 2020-05-14 DIAGNOSIS — Z01812 Encounter for preprocedural laboratory examination: Secondary | ICD-10-CM | POA: Diagnosis not present

## 2020-05-14 LAB — SARS CORONAVIRUS 2 (TAT 6-24 HRS): SARS Coronavirus 2: NEGATIVE

## 2020-05-16 ENCOUNTER — Other Ambulatory Visit: Payer: Self-pay

## 2020-05-16 ENCOUNTER — Ambulatory Visit
Admission: RE | Admit: 2020-05-16 | Discharge: 2020-05-16 | Disposition: A | Discharge status: Home / Self Care | Payer: Medicare HMO | Attending: Cardiology | Admitting: Cardiology

## 2020-05-16 ENCOUNTER — Encounter: Payer: Self-pay | Admitting: Cardiology

## 2020-05-16 ENCOUNTER — Encounter
Admission: RE | Disposition: A | Discharge status: Home / Self Care | Payer: Self-pay | Source: Home / Self Care | Attending: Cardiology

## 2020-05-16 DIAGNOSIS — Z882 Allergy status to sulfonamides status: Secondary | ICD-10-CM | POA: Insufficient documentation

## 2020-05-16 DIAGNOSIS — F419 Anxiety disorder, unspecified: Secondary | ICD-10-CM | POA: Insufficient documentation

## 2020-05-16 DIAGNOSIS — E781 Pure hyperglyceridemia: Secondary | ICD-10-CM | POA: Insufficient documentation

## 2020-05-16 DIAGNOSIS — Z91040 Latex allergy status: Secondary | ICD-10-CM | POA: Diagnosis not present

## 2020-05-16 DIAGNOSIS — I10 Essential (primary) hypertension: Secondary | ICD-10-CM | POA: Diagnosis not present

## 2020-05-16 DIAGNOSIS — R002 Palpitations: Secondary | ICD-10-CM | POA: Insufficient documentation

## 2020-05-16 DIAGNOSIS — Z79890 Hormone replacement therapy (postmenopausal): Secondary | ICD-10-CM | POA: Insufficient documentation

## 2020-05-16 DIAGNOSIS — Z7982 Long term (current) use of aspirin: Secondary | ICD-10-CM | POA: Diagnosis not present

## 2020-05-16 DIAGNOSIS — J45909 Unspecified asthma, uncomplicated: Secondary | ICD-10-CM | POA: Insufficient documentation

## 2020-05-16 DIAGNOSIS — Z79899 Other long term (current) drug therapy: Secondary | ICD-10-CM | POA: Insufficient documentation

## 2020-05-16 DIAGNOSIS — I639 Cerebral infarction, unspecified: Secondary | ICD-10-CM | POA: Insufficient documentation

## 2020-05-16 DIAGNOSIS — Z91048 Other nonmedicinal substance allergy status: Secondary | ICD-10-CM | POA: Insufficient documentation

## 2020-05-16 DIAGNOSIS — Z8673 Personal history of transient ischemic attack (TIA), and cerebral infarction without residual deficits: Secondary | ICD-10-CM

## 2020-05-16 HISTORY — PX: LOOP RECORDER INSERTION: EP1214

## 2020-05-16 SURGERY — LOOP RECORDER INSERTION
Anesthesia: LOCAL

## 2020-05-16 MED ORDER — LIDOCAINE-EPINEPHRINE (PF) 1 %-1:200000 IJ SOLN
INTRAMUSCULAR | Status: DC | PRN
  Administered 2020-05-16: 20 mL

## 2020-05-16 MED ORDER — LIDOCAINE-EPINEPHRINE (PF) 1 %-1:200000 IJ SOLN
INTRAMUSCULAR | Status: AC
  Filled 2020-05-16: qty 20

## 2020-05-16 SURGICAL SUPPLY — 2 items
LOOP REVEAL LINQ LNQ11 (Prosthesis & Implant Heart) ×1 IMPLANT
PACK LOOP INSERTION (CUSTOM PROCEDURE TRAY) ×1 IMPLANT

## 2020-05-16 NOTE — Discharge Instructions (Signed)
Implantable Loop Recorder Placement, Care After This sheet gives you information about how to care for yourself after your procedure. Your health care provider may also give you more specific instructions. If you have problems or questions, contact your health care provider. What can I expect after the procedure? After the procedure, it is common to have:  Soreness or discomfort near the incision.  Some swelling or bruising near the incision. Follow these instructions at home: Incision care   Follow instructions from your health care provider about how to take care of your incision. Make sure you: ? Wash your hands with soap and water before you change your bandage (dressing). If soap and water are not available, use hand sanitizer. ? Leave dressing on until day of follow up appointment ? Leave stitches (sutures), skin glue, or adhesive strips in place. These skin closures may need to stay in place for 2 weeks or longer. If adhesive strip edges start to loosen and curl up, you may trim the loose edges. Do not remove adhesive strips completely unless your health care provider tells you to do that.  Check your incision area every day for signs of infection. Check for: ? Redness, swelling, or pain. ? Fluid or blood. ? Warmth. ? Pus or a bad smell.  You may shower tomorrow Activity   Return to your normal activities as told by your health care provider. Ask your health care provider what activities are safe for you.  Do not drive for 24 hours if you were given a sedative during your procedure. General instructions  Follow instructions from your health care provider about how to manage your implantable loop recorder and transmit the information. Learn how to activate a recording if this is necessary for your type of device.  Do not go through a metal detection gate, and do not let someone hold a metal detector over your chest. Show your ID card.  Do not have an MRI unless you check  with your health care provider first.  Take over-the-counter and prescription medicines only as told by your health care provider.  Keep all follow-up visits as told by your health care provider. This is important. Contact a health care provider if:  You have redness, swelling, or pain around your incision.  You have a fever.  You have pain that is not relieved by your pain medicine.  You have triggered your device because of fainting (syncope) or because of a heartbeat that feels like it is racing, slow, fluttering, or skipping (palpitations). Get help right away if you have:  Chest pain.  Difficulty breathing. Summary  After the procedure, it is common to have soreness or discomfort near the incision.  Change your dressing as told by your health care provider.  Follow instructions from your health care provider about how to manage your implantable loop recorder and transmit the information.  Keep all follow-up visits as told by your health care provider. This is important. This information is not intended to replace advice given to you by your health care provider. Make sure you discuss any questions you have with your health care provider. Document Released: 11/05/2015 Document Revised: 01/09/2018 Document Reviewed: 01/09/2018 Elsevier Patient Education  2020 Elsevier Inc.You may shower tomorrow (Thursday 05/17/2020)  Remove outer bandaid after your shower tomorrow.  Please be careful to leave the steri-strips in place while you are removing the outer bandaid.

## 2020-06-06 DIAGNOSIS — E538 Deficiency of other specified B group vitamins: Secondary | ICD-10-CM | POA: Diagnosis not present

## 2020-06-06 DIAGNOSIS — E039 Hypothyroidism, unspecified: Secondary | ICD-10-CM | POA: Diagnosis not present

## 2020-06-06 DIAGNOSIS — I10 Essential (primary) hypertension: Secondary | ICD-10-CM | POA: Diagnosis not present

## 2020-06-06 DIAGNOSIS — E781 Pure hyperglyceridemia: Secondary | ICD-10-CM | POA: Diagnosis not present

## 2020-06-06 DIAGNOSIS — R8290 Unspecified abnormal findings in urine: Secondary | ICD-10-CM | POA: Diagnosis not present

## 2020-06-06 DIAGNOSIS — Z79899 Other long term (current) drug therapy: Secondary | ICD-10-CM | POA: Diagnosis not present

## 2020-06-13 DIAGNOSIS — I10 Essential (primary) hypertension: Secondary | ICD-10-CM | POA: Diagnosis not present

## 2020-06-13 DIAGNOSIS — Z0000 Encounter for general adult medical examination without abnormal findings: Secondary | ICD-10-CM | POA: Diagnosis not present

## 2020-06-13 DIAGNOSIS — E781 Pure hyperglyceridemia: Secondary | ICD-10-CM | POA: Diagnosis not present

## 2020-06-19 DIAGNOSIS — M25551 Pain in right hip: Secondary | ICD-10-CM | POA: Diagnosis not present

## 2020-06-19 DIAGNOSIS — F419 Anxiety disorder, unspecified: Secondary | ICD-10-CM | POA: Diagnosis not present

## 2020-06-19 DIAGNOSIS — R0600 Dyspnea, unspecified: Secondary | ICD-10-CM | POA: Diagnosis not present

## 2020-06-19 DIAGNOSIS — M25552 Pain in left hip: Secondary | ICD-10-CM | POA: Diagnosis not present

## 2020-06-19 DIAGNOSIS — R05 Cough: Secondary | ICD-10-CM | POA: Diagnosis not present

## 2020-06-19 DIAGNOSIS — F5105 Insomnia due to other mental disorder: Secondary | ICD-10-CM | POA: Diagnosis not present

## 2020-06-19 DIAGNOSIS — I10 Essential (primary) hypertension: Secondary | ICD-10-CM | POA: Diagnosis not present

## 2020-06-21 DIAGNOSIS — H26491 Other secondary cataract, right eye: Secondary | ICD-10-CM | POA: Diagnosis not present

## 2020-06-21 DIAGNOSIS — Z961 Presence of intraocular lens: Secondary | ICD-10-CM | POA: Diagnosis not present

## 2020-06-21 DIAGNOSIS — H18413 Arcus senilis, bilateral: Secondary | ICD-10-CM | POA: Diagnosis not present

## 2020-06-21 DIAGNOSIS — H524 Presbyopia: Secondary | ICD-10-CM | POA: Diagnosis not present

## 2020-06-28 DIAGNOSIS — R269 Unspecified abnormalities of gait and mobility: Secondary | ICD-10-CM | POA: Diagnosis not present

## 2020-06-28 DIAGNOSIS — H93A3 Pulsatile tinnitus, bilateral: Secondary | ICD-10-CM | POA: Diagnosis not present

## 2020-06-29 ENCOUNTER — Other Ambulatory Visit: Payer: Self-pay | Admitting: Neurology

## 2020-06-29 DIAGNOSIS — H93A3 Pulsatile tinnitus, bilateral: Secondary | ICD-10-CM

## 2020-07-16 ENCOUNTER — Ambulatory Visit: Payer: Medicare HMO

## 2020-07-18 DIAGNOSIS — I639 Cerebral infarction, unspecified: Secondary | ICD-10-CM | POA: Diagnosis not present

## 2020-08-13 DIAGNOSIS — H26491 Other secondary cataract, right eye: Secondary | ICD-10-CM | POA: Diagnosis not present

## 2020-08-14 DIAGNOSIS — Z961 Presence of intraocular lens: Secondary | ICD-10-CM | POA: Diagnosis not present

## 2020-08-14 DIAGNOSIS — H18411 Arcus senilis, right eye: Secondary | ICD-10-CM | POA: Diagnosis not present

## 2020-08-14 DIAGNOSIS — H26491 Other secondary cataract, right eye: Secondary | ICD-10-CM | POA: Diagnosis not present

## 2020-08-14 DIAGNOSIS — H02831 Dermatochalasis of right upper eyelid: Secondary | ICD-10-CM | POA: Diagnosis not present

## 2020-08-16 DIAGNOSIS — R768 Other specified abnormal immunological findings in serum: Secondary | ICD-10-CM | POA: Diagnosis not present

## 2020-08-16 DIAGNOSIS — M47812 Spondylosis without myelopathy or radiculopathy, cervical region: Secondary | ICD-10-CM | POA: Diagnosis not present

## 2020-08-16 DIAGNOSIS — M4319 Spondylolisthesis, multiple sites in spine: Secondary | ICD-10-CM | POA: Diagnosis not present

## 2020-08-16 DIAGNOSIS — M4302 Spondylolysis, cervical region: Secondary | ICD-10-CM | POA: Diagnosis not present

## 2020-08-16 DIAGNOSIS — M47813 Spondylosis without myelopathy or radiculopathy, cervicothoracic region: Secondary | ICD-10-CM | POA: Diagnosis not present

## 2020-08-16 DIAGNOSIS — M4306 Spondylolysis, lumbar region: Secondary | ICD-10-CM | POA: Diagnosis not present

## 2020-08-21 DIAGNOSIS — H524 Presbyopia: Secondary | ICD-10-CM | POA: Diagnosis not present

## 2020-08-23 DIAGNOSIS — M5412 Radiculopathy, cervical region: Secondary | ICD-10-CM | POA: Diagnosis not present

## 2020-08-23 DIAGNOSIS — M5136 Other intervertebral disc degeneration, lumbar region: Secondary | ICD-10-CM | POA: Diagnosis not present

## 2020-08-23 DIAGNOSIS — M5030 Other cervical disc degeneration, unspecified cervical region: Secondary | ICD-10-CM | POA: Diagnosis not present

## 2020-08-23 DIAGNOSIS — M5416 Radiculopathy, lumbar region: Secondary | ICD-10-CM | POA: Diagnosis not present

## 2020-08-30 DIAGNOSIS — J45909 Unspecified asthma, uncomplicated: Secondary | ICD-10-CM | POA: Diagnosis not present

## 2020-08-30 DIAGNOSIS — M5116 Intervertebral disc disorders with radiculopathy, lumbar region: Secondary | ICD-10-CM | POA: Diagnosis not present

## 2020-08-30 DIAGNOSIS — R079 Chest pain, unspecified: Secondary | ICD-10-CM | POA: Diagnosis not present

## 2020-08-30 DIAGNOSIS — Z95818 Presence of other cardiac implants and grafts: Secondary | ICD-10-CM | POA: Diagnosis not present

## 2020-08-30 DIAGNOSIS — F419 Anxiety disorder, unspecified: Secondary | ICD-10-CM | POA: Diagnosis not present

## 2020-08-30 DIAGNOSIS — K861 Other chronic pancreatitis: Secondary | ICD-10-CM | POA: Diagnosis not present

## 2020-08-30 DIAGNOSIS — I10 Essential (primary) hypertension: Secondary | ICD-10-CM | POA: Diagnosis not present

## 2020-08-30 DIAGNOSIS — M1990 Unspecified osteoarthritis, unspecified site: Secondary | ICD-10-CM | POA: Diagnosis not present

## 2020-08-30 DIAGNOSIS — M50122 Cervical disc disorder at C5-C6 level with radiculopathy: Secondary | ICD-10-CM | POA: Diagnosis not present

## 2020-09-04 DIAGNOSIS — K861 Other chronic pancreatitis: Secondary | ICD-10-CM | POA: Diagnosis not present

## 2020-09-04 DIAGNOSIS — Z95818 Presence of other cardiac implants and grafts: Secondary | ICD-10-CM | POA: Diagnosis not present

## 2020-09-04 DIAGNOSIS — R079 Chest pain, unspecified: Secondary | ICD-10-CM | POA: Diagnosis not present

## 2020-09-04 DIAGNOSIS — J45909 Unspecified asthma, uncomplicated: Secondary | ICD-10-CM | POA: Diagnosis not present

## 2020-09-04 DIAGNOSIS — I10 Essential (primary) hypertension: Secondary | ICD-10-CM | POA: Diagnosis not present

## 2020-09-04 DIAGNOSIS — M1990 Unspecified osteoarthritis, unspecified site: Secondary | ICD-10-CM | POA: Diagnosis not present

## 2020-09-04 DIAGNOSIS — M5116 Intervertebral disc disorders with radiculopathy, lumbar region: Secondary | ICD-10-CM | POA: Diagnosis not present

## 2020-09-04 DIAGNOSIS — F419 Anxiety disorder, unspecified: Secondary | ICD-10-CM | POA: Diagnosis not present

## 2020-09-04 DIAGNOSIS — M50122 Cervical disc disorder at C5-C6 level with radiculopathy: Secondary | ICD-10-CM | POA: Diagnosis not present

## 2020-09-07 DIAGNOSIS — I10 Essential (primary) hypertension: Secondary | ICD-10-CM | POA: Diagnosis not present

## 2020-09-07 DIAGNOSIS — R079 Chest pain, unspecified: Secondary | ICD-10-CM | POA: Diagnosis not present

## 2020-09-07 DIAGNOSIS — M1990 Unspecified osteoarthritis, unspecified site: Secondary | ICD-10-CM | POA: Diagnosis not present

## 2020-09-07 DIAGNOSIS — Z95818 Presence of other cardiac implants and grafts: Secondary | ICD-10-CM | POA: Diagnosis not present

## 2020-09-07 DIAGNOSIS — F419 Anxiety disorder, unspecified: Secondary | ICD-10-CM | POA: Diagnosis not present

## 2020-09-07 DIAGNOSIS — M5116 Intervertebral disc disorders with radiculopathy, lumbar region: Secondary | ICD-10-CM | POA: Diagnosis not present

## 2020-09-07 DIAGNOSIS — K861 Other chronic pancreatitis: Secondary | ICD-10-CM | POA: Diagnosis not present

## 2020-09-07 DIAGNOSIS — M50122 Cervical disc disorder at C5-C6 level with radiculopathy: Secondary | ICD-10-CM | POA: Diagnosis not present

## 2020-09-07 DIAGNOSIS — J45909 Unspecified asthma, uncomplicated: Secondary | ICD-10-CM | POA: Diagnosis not present

## 2020-09-11 DIAGNOSIS — R079 Chest pain, unspecified: Secondary | ICD-10-CM | POA: Diagnosis not present

## 2020-09-11 DIAGNOSIS — F419 Anxiety disorder, unspecified: Secondary | ICD-10-CM | POA: Diagnosis not present

## 2020-09-11 DIAGNOSIS — Z95818 Presence of other cardiac implants and grafts: Secondary | ICD-10-CM | POA: Diagnosis not present

## 2020-09-11 DIAGNOSIS — K861 Other chronic pancreatitis: Secondary | ICD-10-CM | POA: Diagnosis not present

## 2020-09-11 DIAGNOSIS — M5116 Intervertebral disc disorders with radiculopathy, lumbar region: Secondary | ICD-10-CM | POA: Diagnosis not present

## 2020-09-11 DIAGNOSIS — M1990 Unspecified osteoarthritis, unspecified site: Secondary | ICD-10-CM | POA: Diagnosis not present

## 2020-09-11 DIAGNOSIS — I10 Essential (primary) hypertension: Secondary | ICD-10-CM | POA: Diagnosis not present

## 2020-09-11 DIAGNOSIS — M50122 Cervical disc disorder at C5-C6 level with radiculopathy: Secondary | ICD-10-CM | POA: Diagnosis not present

## 2020-09-11 DIAGNOSIS — J45909 Unspecified asthma, uncomplicated: Secondary | ICD-10-CM | POA: Diagnosis not present

## 2020-09-12 DIAGNOSIS — Z79899 Other long term (current) drug therapy: Secondary | ICD-10-CM | POA: Diagnosis not present

## 2020-09-12 DIAGNOSIS — E781 Pure hyperglyceridemia: Secondary | ICD-10-CM | POA: Diagnosis not present

## 2020-09-12 DIAGNOSIS — I10 Essential (primary) hypertension: Secondary | ICD-10-CM | POA: Diagnosis not present

## 2020-09-12 DIAGNOSIS — Z8673 Personal history of transient ischemic attack (TIA), and cerebral infarction without residual deficits: Secondary | ICD-10-CM | POA: Diagnosis not present

## 2020-09-14 DIAGNOSIS — K861 Other chronic pancreatitis: Secondary | ICD-10-CM | POA: Diagnosis not present

## 2020-09-14 DIAGNOSIS — M5116 Intervertebral disc disorders with radiculopathy, lumbar region: Secondary | ICD-10-CM | POA: Diagnosis not present

## 2020-09-14 DIAGNOSIS — R079 Chest pain, unspecified: Secondary | ICD-10-CM | POA: Diagnosis not present

## 2020-09-14 DIAGNOSIS — I10 Essential (primary) hypertension: Secondary | ICD-10-CM | POA: Diagnosis not present

## 2020-09-14 DIAGNOSIS — Z95818 Presence of other cardiac implants and grafts: Secondary | ICD-10-CM | POA: Diagnosis not present

## 2020-09-14 DIAGNOSIS — M50122 Cervical disc disorder at C5-C6 level with radiculopathy: Secondary | ICD-10-CM | POA: Diagnosis not present

## 2020-09-14 DIAGNOSIS — F419 Anxiety disorder, unspecified: Secondary | ICD-10-CM | POA: Diagnosis not present

## 2020-09-14 DIAGNOSIS — M1990 Unspecified osteoarthritis, unspecified site: Secondary | ICD-10-CM | POA: Diagnosis not present

## 2020-09-14 DIAGNOSIS — J45909 Unspecified asthma, uncomplicated: Secondary | ICD-10-CM | POA: Diagnosis not present

## 2020-09-18 DIAGNOSIS — F5105 Insomnia due to other mental disorder: Secondary | ICD-10-CM | POA: Diagnosis not present

## 2020-09-18 DIAGNOSIS — J45991 Cough variant asthma: Secondary | ICD-10-CM | POA: Diagnosis not present

## 2020-09-18 DIAGNOSIS — R5381 Other malaise: Secondary | ICD-10-CM | POA: Diagnosis not present

## 2020-09-18 DIAGNOSIS — R5383 Other fatigue: Secondary | ICD-10-CM | POA: Diagnosis not present

## 2020-09-18 DIAGNOSIS — F419 Anxiety disorder, unspecified: Secondary | ICD-10-CM | POA: Diagnosis not present

## 2020-09-19 DIAGNOSIS — Z95818 Presence of other cardiac implants and grafts: Secondary | ICD-10-CM | POA: Diagnosis not present

## 2020-09-19 DIAGNOSIS — F419 Anxiety disorder, unspecified: Secondary | ICD-10-CM | POA: Diagnosis not present

## 2020-09-19 DIAGNOSIS — I10 Essential (primary) hypertension: Secondary | ICD-10-CM | POA: Diagnosis not present

## 2020-09-19 DIAGNOSIS — M5116 Intervertebral disc disorders with radiculopathy, lumbar region: Secondary | ICD-10-CM | POA: Diagnosis not present

## 2020-09-19 DIAGNOSIS — M1990 Unspecified osteoarthritis, unspecified site: Secondary | ICD-10-CM | POA: Diagnosis not present

## 2020-09-19 DIAGNOSIS — J45909 Unspecified asthma, uncomplicated: Secondary | ICD-10-CM | POA: Diagnosis not present

## 2020-09-19 DIAGNOSIS — K861 Other chronic pancreatitis: Secondary | ICD-10-CM | POA: Diagnosis not present

## 2020-09-19 DIAGNOSIS — M50122 Cervical disc disorder at C5-C6 level with radiculopathy: Secondary | ICD-10-CM | POA: Diagnosis not present

## 2020-09-19 DIAGNOSIS — R079 Chest pain, unspecified: Secondary | ICD-10-CM | POA: Diagnosis not present

## 2020-09-21 DIAGNOSIS — F419 Anxiety disorder, unspecified: Secondary | ICD-10-CM | POA: Diagnosis not present

## 2020-09-21 DIAGNOSIS — I10 Essential (primary) hypertension: Secondary | ICD-10-CM | POA: Diagnosis not present

## 2020-09-21 DIAGNOSIS — R079 Chest pain, unspecified: Secondary | ICD-10-CM | POA: Diagnosis not present

## 2020-09-21 DIAGNOSIS — M1990 Unspecified osteoarthritis, unspecified site: Secondary | ICD-10-CM | POA: Diagnosis not present

## 2020-09-21 DIAGNOSIS — Z95818 Presence of other cardiac implants and grafts: Secondary | ICD-10-CM | POA: Diagnosis not present

## 2020-09-21 DIAGNOSIS — M5116 Intervertebral disc disorders with radiculopathy, lumbar region: Secondary | ICD-10-CM | POA: Diagnosis not present

## 2020-09-21 DIAGNOSIS — M50122 Cervical disc disorder at C5-C6 level with radiculopathy: Secondary | ICD-10-CM | POA: Diagnosis not present

## 2020-09-21 DIAGNOSIS — K861 Other chronic pancreatitis: Secondary | ICD-10-CM | POA: Diagnosis not present

## 2020-09-21 DIAGNOSIS — J45909 Unspecified asthma, uncomplicated: Secondary | ICD-10-CM | POA: Diagnosis not present

## 2020-09-26 DIAGNOSIS — G43119 Migraine with aura, intractable, without status migrainosus: Secondary | ICD-10-CM | POA: Diagnosis not present

## 2020-09-26 DIAGNOSIS — R2689 Other abnormalities of gait and mobility: Secondary | ICD-10-CM | POA: Diagnosis not present

## 2020-09-26 DIAGNOSIS — Z8673 Personal history of transient ischemic attack (TIA), and cerebral infarction without residual deficits: Secondary | ICD-10-CM | POA: Diagnosis not present

## 2020-09-26 DIAGNOSIS — R202 Paresthesia of skin: Secondary | ICD-10-CM | POA: Diagnosis not present

## 2020-09-26 DIAGNOSIS — R292 Abnormal reflex: Secondary | ICD-10-CM | POA: Diagnosis not present

## 2020-09-26 DIAGNOSIS — R200 Anesthesia of skin: Secondary | ICD-10-CM | POA: Diagnosis not present

## 2020-09-27 DIAGNOSIS — K861 Other chronic pancreatitis: Secondary | ICD-10-CM | POA: Diagnosis not present

## 2020-09-27 DIAGNOSIS — M1990 Unspecified osteoarthritis, unspecified site: Secondary | ICD-10-CM | POA: Diagnosis not present

## 2020-09-27 DIAGNOSIS — Z95818 Presence of other cardiac implants and grafts: Secondary | ICD-10-CM | POA: Diagnosis not present

## 2020-09-27 DIAGNOSIS — M50122 Cervical disc disorder at C5-C6 level with radiculopathy: Secondary | ICD-10-CM | POA: Diagnosis not present

## 2020-09-27 DIAGNOSIS — F419 Anxiety disorder, unspecified: Secondary | ICD-10-CM | POA: Diagnosis not present

## 2020-09-27 DIAGNOSIS — I10 Essential (primary) hypertension: Secondary | ICD-10-CM | POA: Diagnosis not present

## 2020-09-27 DIAGNOSIS — J45909 Unspecified asthma, uncomplicated: Secondary | ICD-10-CM | POA: Diagnosis not present

## 2020-09-27 DIAGNOSIS — R079 Chest pain, unspecified: Secondary | ICD-10-CM | POA: Diagnosis not present

## 2020-09-27 DIAGNOSIS — M5116 Intervertebral disc disorders with radiculopathy, lumbar region: Secondary | ICD-10-CM | POA: Diagnosis not present

## 2020-09-29 DIAGNOSIS — M1990 Unspecified osteoarthritis, unspecified site: Secondary | ICD-10-CM | POA: Diagnosis not present

## 2020-09-29 DIAGNOSIS — J45909 Unspecified asthma, uncomplicated: Secondary | ICD-10-CM | POA: Diagnosis not present

## 2020-09-29 DIAGNOSIS — R079 Chest pain, unspecified: Secondary | ICD-10-CM | POA: Diagnosis not present

## 2020-09-29 DIAGNOSIS — M50122 Cervical disc disorder at C5-C6 level with radiculopathy: Secondary | ICD-10-CM | POA: Diagnosis not present

## 2020-09-29 DIAGNOSIS — M5116 Intervertebral disc disorders with radiculopathy, lumbar region: Secondary | ICD-10-CM | POA: Diagnosis not present

## 2020-09-29 DIAGNOSIS — F419 Anxiety disorder, unspecified: Secondary | ICD-10-CM | POA: Diagnosis not present

## 2020-09-29 DIAGNOSIS — I10 Essential (primary) hypertension: Secondary | ICD-10-CM | POA: Diagnosis not present

## 2020-09-29 DIAGNOSIS — Z95818 Presence of other cardiac implants and grafts: Secondary | ICD-10-CM | POA: Diagnosis not present

## 2020-09-29 DIAGNOSIS — K861 Other chronic pancreatitis: Secondary | ICD-10-CM | POA: Diagnosis not present

## 2020-10-02 DIAGNOSIS — K861 Other chronic pancreatitis: Secondary | ICD-10-CM | POA: Diagnosis not present

## 2020-10-02 DIAGNOSIS — M5116 Intervertebral disc disorders with radiculopathy, lumbar region: Secondary | ICD-10-CM | POA: Diagnosis not present

## 2020-10-02 DIAGNOSIS — F419 Anxiety disorder, unspecified: Secondary | ICD-10-CM | POA: Diagnosis not present

## 2020-10-02 DIAGNOSIS — J45909 Unspecified asthma, uncomplicated: Secondary | ICD-10-CM | POA: Diagnosis not present

## 2020-10-02 DIAGNOSIS — M50122 Cervical disc disorder at C5-C6 level with radiculopathy: Secondary | ICD-10-CM | POA: Diagnosis not present

## 2020-10-02 DIAGNOSIS — R079 Chest pain, unspecified: Secondary | ICD-10-CM | POA: Diagnosis not present

## 2020-10-02 DIAGNOSIS — M1990 Unspecified osteoarthritis, unspecified site: Secondary | ICD-10-CM | POA: Diagnosis not present

## 2020-10-02 DIAGNOSIS — I10 Essential (primary) hypertension: Secondary | ICD-10-CM | POA: Diagnosis not present

## 2020-10-02 DIAGNOSIS — Z95818 Presence of other cardiac implants and grafts: Secondary | ICD-10-CM | POA: Diagnosis not present

## 2020-10-10 DIAGNOSIS — I10 Essential (primary) hypertension: Secondary | ICD-10-CM | POA: Diagnosis not present

## 2020-10-10 DIAGNOSIS — F419 Anxiety disorder, unspecified: Secondary | ICD-10-CM | POA: Diagnosis not present

## 2020-10-10 DIAGNOSIS — M50122 Cervical disc disorder at C5-C6 level with radiculopathy: Secondary | ICD-10-CM | POA: Diagnosis not present

## 2020-10-10 DIAGNOSIS — Z95818 Presence of other cardiac implants and grafts: Secondary | ICD-10-CM | POA: Diagnosis not present

## 2020-10-10 DIAGNOSIS — K861 Other chronic pancreatitis: Secondary | ICD-10-CM | POA: Diagnosis not present

## 2020-10-10 DIAGNOSIS — M1990 Unspecified osteoarthritis, unspecified site: Secondary | ICD-10-CM | POA: Diagnosis not present

## 2020-10-10 DIAGNOSIS — M5116 Intervertebral disc disorders with radiculopathy, lumbar region: Secondary | ICD-10-CM | POA: Diagnosis not present

## 2020-10-10 DIAGNOSIS — R079 Chest pain, unspecified: Secondary | ICD-10-CM | POA: Diagnosis not present

## 2020-10-10 DIAGNOSIS — J45909 Unspecified asthma, uncomplicated: Secondary | ICD-10-CM | POA: Diagnosis not present

## 2020-10-17 DIAGNOSIS — J45909 Unspecified asthma, uncomplicated: Secondary | ICD-10-CM | POA: Diagnosis not present

## 2020-10-17 DIAGNOSIS — I10 Essential (primary) hypertension: Secondary | ICD-10-CM | POA: Diagnosis not present

## 2020-10-17 DIAGNOSIS — M1990 Unspecified osteoarthritis, unspecified site: Secondary | ICD-10-CM | POA: Diagnosis not present

## 2020-10-17 DIAGNOSIS — Z95818 Presence of other cardiac implants and grafts: Secondary | ICD-10-CM | POA: Diagnosis not present

## 2020-10-17 DIAGNOSIS — F419 Anxiety disorder, unspecified: Secondary | ICD-10-CM | POA: Diagnosis not present

## 2020-10-17 DIAGNOSIS — M50122 Cervical disc disorder at C5-C6 level with radiculopathy: Secondary | ICD-10-CM | POA: Diagnosis not present

## 2020-10-17 DIAGNOSIS — K861 Other chronic pancreatitis: Secondary | ICD-10-CM | POA: Diagnosis not present

## 2020-10-17 DIAGNOSIS — M5116 Intervertebral disc disorders with radiculopathy, lumbar region: Secondary | ICD-10-CM | POA: Diagnosis not present

## 2020-10-17 DIAGNOSIS — R079 Chest pain, unspecified: Secondary | ICD-10-CM | POA: Diagnosis not present

## 2020-10-18 DIAGNOSIS — M5136 Other intervertebral disc degeneration, lumbar region: Secondary | ICD-10-CM | POA: Diagnosis not present

## 2020-10-18 DIAGNOSIS — M5416 Radiculopathy, lumbar region: Secondary | ICD-10-CM | POA: Diagnosis not present

## 2020-10-18 DIAGNOSIS — M5412 Radiculopathy, cervical region: Secondary | ICD-10-CM | POA: Diagnosis not present

## 2020-10-18 DIAGNOSIS — M5030 Other cervical disc degeneration, unspecified cervical region: Secondary | ICD-10-CM | POA: Diagnosis not present

## 2020-10-22 DIAGNOSIS — K861 Other chronic pancreatitis: Secondary | ICD-10-CM | POA: Diagnosis not present

## 2020-10-22 DIAGNOSIS — I10 Essential (primary) hypertension: Secondary | ICD-10-CM | POA: Diagnosis not present

## 2020-10-22 DIAGNOSIS — R079 Chest pain, unspecified: Secondary | ICD-10-CM | POA: Diagnosis not present

## 2020-10-22 DIAGNOSIS — Z95818 Presence of other cardiac implants and grafts: Secondary | ICD-10-CM | POA: Diagnosis not present

## 2020-10-22 DIAGNOSIS — J45909 Unspecified asthma, uncomplicated: Secondary | ICD-10-CM | POA: Diagnosis not present

## 2020-10-22 DIAGNOSIS — F419 Anxiety disorder, unspecified: Secondary | ICD-10-CM | POA: Diagnosis not present

## 2020-10-22 DIAGNOSIS — M50122 Cervical disc disorder at C5-C6 level with radiculopathy: Secondary | ICD-10-CM | POA: Diagnosis not present

## 2020-10-22 DIAGNOSIS — M1990 Unspecified osteoarthritis, unspecified site: Secondary | ICD-10-CM | POA: Diagnosis not present

## 2020-10-22 DIAGNOSIS — M5116 Intervertebral disc disorders with radiculopathy, lumbar region: Secondary | ICD-10-CM | POA: Diagnosis not present

## 2020-10-24 DIAGNOSIS — I10 Essential (primary) hypertension: Secondary | ICD-10-CM | POA: Diagnosis not present

## 2020-10-24 DIAGNOSIS — Z8673 Personal history of transient ischemic attack (TIA), and cerebral infarction without residual deficits: Secondary | ICD-10-CM | POA: Diagnosis not present

## 2020-10-24 DIAGNOSIS — R059 Cough, unspecified: Secondary | ICD-10-CM | POA: Diagnosis not present

## 2020-10-24 DIAGNOSIS — R002 Palpitations: Secondary | ICD-10-CM | POA: Diagnosis not present

## 2020-10-24 DIAGNOSIS — R058 Other specified cough: Secondary | ICD-10-CM | POA: Diagnosis not present

## 2020-10-25 DIAGNOSIS — J310 Chronic rhinitis: Secondary | ICD-10-CM | POA: Diagnosis not present

## 2020-10-25 DIAGNOSIS — J45991 Cough variant asthma: Secondary | ICD-10-CM | POA: Diagnosis not present

## 2020-10-25 DIAGNOSIS — R059 Cough, unspecified: Secondary | ICD-10-CM | POA: Diagnosis not present

## 2020-12-13 DIAGNOSIS — R768 Other specified abnormal immunological findings in serum: Secondary | ICD-10-CM | POA: Diagnosis not present

## 2020-12-13 DIAGNOSIS — M160 Bilateral primary osteoarthritis of hip: Secondary | ICD-10-CM | POA: Diagnosis not present

## 2020-12-13 DIAGNOSIS — M4306 Spondylolysis, lumbar region: Secondary | ICD-10-CM | POA: Diagnosis not present

## 2020-12-13 DIAGNOSIS — M47812 Spondylosis without myelopathy or radiculopathy, cervical region: Secondary | ICD-10-CM | POA: Diagnosis not present

## 2020-12-17 DIAGNOSIS — E781 Pure hyperglyceridemia: Secondary | ICD-10-CM | POA: Diagnosis not present

## 2020-12-17 DIAGNOSIS — E039 Hypothyroidism, unspecified: Secondary | ICD-10-CM | POA: Diagnosis not present

## 2021-01-29 DIAGNOSIS — R059 Cough, unspecified: Secondary | ICD-10-CM | POA: Diagnosis not present

## 2021-03-01 DIAGNOSIS — Z0000 Encounter for general adult medical examination without abnormal findings: Secondary | ICD-10-CM | POA: Diagnosis not present

## 2021-03-01 DIAGNOSIS — Z79899 Other long term (current) drug therapy: Secondary | ICD-10-CM | POA: Diagnosis not present

## 2021-03-01 DIAGNOSIS — F419 Anxiety disorder, unspecified: Secondary | ICD-10-CM | POA: Diagnosis not present

## 2021-03-01 DIAGNOSIS — E785 Hyperlipidemia, unspecified: Secondary | ICD-10-CM | POA: Diagnosis not present

## 2021-03-01 DIAGNOSIS — I10 Essential (primary) hypertension: Secondary | ICD-10-CM | POA: Diagnosis not present

## 2021-03-01 DIAGNOSIS — E079 Disorder of thyroid, unspecified: Secondary | ICD-10-CM | POA: Diagnosis not present

## 2021-03-01 DIAGNOSIS — R002 Palpitations: Secondary | ICD-10-CM | POA: Diagnosis not present

## 2021-05-20 DIAGNOSIS — R002 Palpitations: Secondary | ICD-10-CM | POA: Diagnosis not present

## 2021-05-20 DIAGNOSIS — I10 Essential (primary) hypertension: Secondary | ICD-10-CM | POA: Diagnosis not present

## 2021-05-20 DIAGNOSIS — R0789 Other chest pain: Secondary | ICD-10-CM | POA: Diagnosis not present

## 2021-05-20 DIAGNOSIS — Z8673 Personal history of transient ischemic attack (TIA), and cerebral infarction without residual deficits: Secondary | ICD-10-CM | POA: Diagnosis not present

## 2021-06-11 DIAGNOSIS — E781 Pure hyperglyceridemia: Secondary | ICD-10-CM | POA: Diagnosis not present

## 2021-06-11 DIAGNOSIS — I10 Essential (primary) hypertension: Secondary | ICD-10-CM | POA: Diagnosis not present

## 2021-06-11 DIAGNOSIS — E039 Hypothyroidism, unspecified: Secondary | ICD-10-CM | POA: Diagnosis not present

## 2021-06-11 DIAGNOSIS — Z79899 Other long term (current) drug therapy: Secondary | ICD-10-CM | POA: Diagnosis not present

## 2021-06-12 DIAGNOSIS — M4306 Spondylolysis, lumbar region: Secondary | ICD-10-CM | POA: Diagnosis not present

## 2021-06-12 DIAGNOSIS — R768 Other specified abnormal immunological findings in serum: Secondary | ICD-10-CM | POA: Diagnosis not present

## 2021-06-18 DIAGNOSIS — Z79899 Other long term (current) drug therapy: Secondary | ICD-10-CM | POA: Diagnosis not present

## 2021-06-18 DIAGNOSIS — I10 Essential (primary) hypertension: Secondary | ICD-10-CM | POA: Diagnosis not present

## 2021-06-18 DIAGNOSIS — E781 Pure hyperglyceridemia: Secondary | ICD-10-CM | POA: Diagnosis not present

## 2021-06-18 DIAGNOSIS — E039 Hypothyroidism, unspecified: Secondary | ICD-10-CM | POA: Diagnosis not present

## 2021-06-18 DIAGNOSIS — E538 Deficiency of other specified B group vitamins: Secondary | ICD-10-CM | POA: Diagnosis not present

## 2021-06-18 DIAGNOSIS — J4520 Mild intermittent asthma, uncomplicated: Secondary | ICD-10-CM | POA: Diagnosis not present

## 2021-07-23 DIAGNOSIS — R0609 Other forms of dyspnea: Secondary | ICD-10-CM | POA: Diagnosis not present

## 2021-07-23 DIAGNOSIS — B370 Candidal stomatitis: Secondary | ICD-10-CM | POA: Diagnosis not present

## 2021-07-23 DIAGNOSIS — J4520 Mild intermittent asthma, uncomplicated: Secondary | ICD-10-CM | POA: Diagnosis not present

## 2021-07-23 DIAGNOSIS — B0223 Postherpetic polyneuropathy: Secondary | ICD-10-CM | POA: Diagnosis not present

## 2021-08-07 DIAGNOSIS — B0239 Other herpes zoster eye disease: Secondary | ICD-10-CM | POA: Diagnosis not present

## 2021-08-16 DIAGNOSIS — Z8673 Personal history of transient ischemic attack (TIA), and cerebral infarction without residual deficits: Secondary | ICD-10-CM | POA: Diagnosis not present

## 2021-08-22 DIAGNOSIS — I10 Essential (primary) hypertension: Secondary | ICD-10-CM | POA: Diagnosis not present

## 2021-08-22 DIAGNOSIS — Z8673 Personal history of transient ischemic attack (TIA), and cerebral infarction without residual deficits: Secondary | ICD-10-CM | POA: Diagnosis not present

## 2021-08-22 DIAGNOSIS — B0229 Other postherpetic nervous system involvement: Secondary | ICD-10-CM | POA: Diagnosis not present

## 2021-09-05 DIAGNOSIS — B0239 Other herpes zoster eye disease: Secondary | ICD-10-CM | POA: Diagnosis not present

## 2021-09-06 DIAGNOSIS — B0229 Other postherpetic nervous system involvement: Secondary | ICD-10-CM | POA: Diagnosis not present

## 2021-12-06 DIAGNOSIS — R2681 Unsteadiness on feet: Secondary | ICD-10-CM | POA: Diagnosis not present

## 2021-12-06 DIAGNOSIS — R8271 Bacteriuria: Secondary | ICD-10-CM | POA: Diagnosis not present

## 2021-12-06 DIAGNOSIS — M542 Cervicalgia: Secondary | ICD-10-CM | POA: Diagnosis not present

## 2021-12-06 DIAGNOSIS — S4992XA Unspecified injury of left shoulder and upper arm, initial encounter: Secondary | ICD-10-CM | POA: Diagnosis not present

## 2021-12-06 DIAGNOSIS — R296 Repeated falls: Secondary | ICD-10-CM | POA: Diagnosis not present

## 2021-12-06 DIAGNOSIS — M4312 Spondylolisthesis, cervical region: Secondary | ICD-10-CM | POA: Diagnosis not present

## 2021-12-16 DIAGNOSIS — E039 Hypothyroidism, unspecified: Secondary | ICD-10-CM | POA: Diagnosis not present

## 2021-12-16 DIAGNOSIS — E538 Deficiency of other specified B group vitamins: Secondary | ICD-10-CM | POA: Diagnosis not present

## 2021-12-16 DIAGNOSIS — E781 Pure hyperglyceridemia: Secondary | ICD-10-CM | POA: Diagnosis not present

## 2021-12-17 DIAGNOSIS — M7582 Other shoulder lesions, left shoulder: Secondary | ICD-10-CM | POA: Diagnosis not present

## 2021-12-23 DIAGNOSIS — E781 Pure hyperglyceridemia: Secondary | ICD-10-CM | POA: Diagnosis not present

## 2021-12-23 DIAGNOSIS — Z0000 Encounter for general adult medical examination without abnormal findings: Secondary | ICD-10-CM | POA: Diagnosis not present

## 2021-12-23 DIAGNOSIS — I10 Essential (primary) hypertension: Secondary | ICD-10-CM | POA: Diagnosis not present

## 2021-12-23 DIAGNOSIS — R8290 Unspecified abnormal findings in urine: Secondary | ICD-10-CM | POA: Diagnosis not present

## 2021-12-31 DIAGNOSIS — M170 Bilateral primary osteoarthritis of knee: Secondary | ICD-10-CM | POA: Diagnosis not present

## 2022-01-06 DIAGNOSIS — Z8673 Personal history of transient ischemic attack (TIA), and cerebral infarction without residual deficits: Secondary | ICD-10-CM | POA: Diagnosis not present

## 2022-01-06 DIAGNOSIS — R292 Abnormal reflex: Secondary | ICD-10-CM | POA: Diagnosis not present

## 2022-01-06 DIAGNOSIS — B0229 Other postherpetic nervous system involvement: Secondary | ICD-10-CM | POA: Diagnosis not present

## 2022-01-06 DIAGNOSIS — R200 Anesthesia of skin: Secondary | ICD-10-CM | POA: Diagnosis not present

## 2022-01-15 DIAGNOSIS — H6983 Other specified disorders of Eustachian tube, bilateral: Secondary | ICD-10-CM | POA: Diagnosis not present

## 2022-01-15 DIAGNOSIS — J301 Allergic rhinitis due to pollen: Secondary | ICD-10-CM | POA: Diagnosis not present

## 2022-01-15 DIAGNOSIS — H903 Sensorineural hearing loss, bilateral: Secondary | ICD-10-CM | POA: Diagnosis not present

## 2022-02-20 DIAGNOSIS — M1711 Unilateral primary osteoarthritis, right knee: Secondary | ICD-10-CM | POA: Diagnosis not present

## 2022-02-20 DIAGNOSIS — M1712 Unilateral primary osteoarthritis, left knee: Secondary | ICD-10-CM | POA: Diagnosis not present

## 2022-02-20 DIAGNOSIS — M170 Bilateral primary osteoarthritis of knee: Secondary | ICD-10-CM | POA: Diagnosis not present

## 2022-02-24 DIAGNOSIS — M1712 Unilateral primary osteoarthritis, left knee: Secondary | ICD-10-CM | POA: Insufficient documentation

## 2022-02-24 DIAGNOSIS — M1711 Unilateral primary osteoarthritis, right knee: Secondary | ICD-10-CM | POA: Insufficient documentation

## 2022-04-14 NOTE — Discharge Instructions (Signed)
Instructions after Total Knee Replacement   Hattye Siegfried P. Nancy Arvin, Jr., M.D.     Dept. of Orthopaedics & Sports Medicine  [facility]  [address]  [place], [place]  [address]  Phone: [phone]   Fax: [phone]    DIET: Drink plenty of non-alcoholic fluids. Resume your normal diet. Include foods high in fiber.  ACTIVITY:  You may use crutches or a walker with weight-bearing as tolerated, unless instructed otherwise. You may be weaned off of the walker or crutches by your Physical Therapist.  Do NOT place pillows under the knee. Anything placed under the knee could limit your ability to straighten the knee.   Continue doing gentle exercises. Exercising will reduce the pain and swelling, increase motion, and prevent muscle weakness.   Please continue to use the TED compression stockings for 6 weeks. You may remove the stockings at night, but should reapply them in the morning. Do not drive or operate any equipment until instructed.  WOUND CARE:  Continue to use the PolarCare or ice packs periodically to reduce pain and swelling. You may bathe or shower after the staples are removed at the first office visit following surgery.  MEDICATIONS: You may resume your regular medications. Please take the pain medication as prescribed on the medication. Do not take pain medication on an empty stomach. You have been given a prescription for a blood thinner (Lovenox or Coumadin). Please take the medication as instructed. (NOTE: After completing a 2 week course of Lovenox, take one Enteric-coated aspirin once a day. This along with elevation will help reduce the possibility of phlebitis in your operated leg.) Do not drive or drink alcoholic beverages when taking pain medications.  CALL THE OFFICE FOR: Temperature above 101 degrees Excessive bleeding or drainage on the dressing. Excessive swelling, coldness, or paleness of the toes. Persistent nausea and vomiting.  FOLLOW-UP:  You  should have an appointment to return to the office in 10-14 days after surgery. Arrangements have been made for continuation of Physical Therapy (either home therapy or outpatient therapy).   [facility] Department Directory         [email or web address]       [email or web address]          Cardiology  Appointments: [place] - [phone] [place] - [phone]  Endocrinology  Appointments: [place] - [phone] [place] - [phone]  Gastroenterology  Appointments: [place] - [phone] [place] - [phone]        General Surgery   Appointments: [place] - [phone]  Internal Medicine/Family Medicine  Appointments: [place] - [phone] [place] - [phone] [place] - [phone]  Metabolic and Weigh Loss Surgery  Appointments: [place] - [phone]        Neurology  Appointments: [place] - [phone] [place] - [phone]  Neurosurgery  Appointments: [place] - [phone]  Obstetrics & Gynecology  Appointments: [place] - [phone] [place] - [phone]        Pediatrics  Appointments: [place] - [phone] [place] - [phone]  Physiatry  Appointments: [place] -[phone]  Physical Therapy  Appointments: [place] - [phone] [place] - [phone]        Podiatry  Appointments: [place] - [phone] [place] - [phone]  Pulmonology  Appointments: [place] - [phone]  Rheumatology  Appointments: [place] - [phone]        [place] Location: [facility]  [address] , [place]  [address]  [place] Location: [facility] [address] [place], [place]  [address]  [place] Location: [facility] [address] [place], [place]  [address]    

## 2022-04-17 ENCOUNTER — Encounter
Admission: RE | Admit: 2022-04-17 | Discharge: 2022-04-17 | Disposition: A | Discharge status: Home / Self Care | Payer: Medicare HMO | Source: Ambulatory Visit | Attending: Orthopedic Surgery | Admitting: Orthopedic Surgery

## 2022-04-17 VITALS — BP 186/73 | HR 83 | Resp 18 | Ht 59.5 in | Wt 116.8 lb

## 2022-04-17 DIAGNOSIS — Z01810 Encounter for preprocedural cardiovascular examination: Secondary | ICD-10-CM | POA: Diagnosis not present

## 2022-04-17 DIAGNOSIS — M1711 Unilateral primary osteoarthritis, right knee: Secondary | ICD-10-CM | POA: Diagnosis not present

## 2022-04-17 DIAGNOSIS — Z01818 Encounter for other preprocedural examination: Secondary | ICD-10-CM | POA: Insufficient documentation

## 2022-04-17 DIAGNOSIS — Z91040 Latex allergy status: Secondary | ICD-10-CM | POA: Diagnosis not present

## 2022-04-17 DIAGNOSIS — M1990 Unspecified osteoarthritis, unspecified site: Secondary | ICD-10-CM

## 2022-04-17 DIAGNOSIS — R002 Palpitations: Secondary | ICD-10-CM

## 2022-04-17 DIAGNOSIS — Z01812 Encounter for preprocedural laboratory examination: Secondary | ICD-10-CM

## 2022-04-17 HISTORY — DX: Personal history of urinary calculi: Z87.442

## 2022-04-17 HISTORY — DX: Other complications of anesthesia, initial encounter: T88.59XA

## 2022-04-17 HISTORY — DX: Hypothyroidism, unspecified: E03.9

## 2022-04-17 HISTORY — DX: Anemia, unspecified: D64.9

## 2022-04-17 LAB — COMPREHENSIVE METABOLIC PANEL
ALT: 21 U/L (ref 0–44)
AST: 24 U/L (ref 15–41)
Albumin: 3.4 g/dL — ABNORMAL LOW (ref 3.5–5.0)
Alkaline Phosphatase: 50 U/L (ref 38–126)
Anion gap: 7 (ref 5–15)
BUN: 17 mg/dL (ref 8–23)
CO2: 25 mmol/L (ref 22–32)
Calcium: 9.6 mg/dL (ref 8.9–10.3)
Chloride: 105 mmol/L (ref 98–111)
Creatinine, Ser: 0.78 mg/dL (ref 0.44–1.00)
GFR, Estimated: >60 mL/min (ref >60)
Glucose, Bld: 76 mg/dL (ref 70–99)
Potassium: 3.3 mmol/L — ABNORMAL LOW (ref 3.5–5.1)
Sodium: 137 mmol/L (ref 135–145)
Total Bilirubin: 0.7 mg/dL (ref 0.3–1.2)
Total Protein: 6.2 g/dL — ABNORMAL LOW (ref 6.5–8.1)

## 2022-04-17 LAB — URINALYSIS, ROUTINE W REFLEX MICROSCOPIC
Bilirubin Urine: NEGATIVE
Glucose, UA: NEGATIVE mg/dL
Hgb urine dipstick: NEGATIVE
Ketones, ur: NEGATIVE mg/dL
Leukocytes,Ua: NEGATIVE
Nitrite: NEGATIVE
Protein, ur: NEGATIVE mg/dL
Specific Gravity, Urine: 1.02 (ref 1.005–1.030)
pH: 5 (ref 5.0–8.0)

## 2022-04-17 LAB — CBC
HCT: 38.1 % (ref 36.0–46.0)
Hemoglobin: 12.2 g/dL (ref 12.0–15.0)
MCH: 28.3 pg (ref 26.0–34.0)
MCHC: 32 g/dL (ref 30.0–36.0)
MCV: 88.4 fL (ref 80.0–100.0)
Platelets: 329 10*3/uL (ref 150–400)
RBC: 4.31 MIL/uL (ref 3.87–5.11)
RDW: 15.6 % — ABNORMAL HIGH (ref 11.5–15.5)
WBC: 16.1 10*3/uL — ABNORMAL HIGH (ref 4.0–10.5)
nRBC: 0 % (ref 0.0–0.2)

## 2022-04-17 LAB — TYPE AND SCREEN
ABO/RH(D): O POS
Antibody Screen: NEGATIVE

## 2022-04-17 LAB — SEDIMENTATION RATE: Sed Rate: 6 mm/hr (ref 0–30)

## 2022-04-17 LAB — SURGICAL PCR SCREEN
MRSA, PCR: NEGATIVE
Staphylococcus aureus: NEGATIVE

## 2022-04-17 LAB — C-REACTIVE PROTEIN: CRP: 1.1 mg/dL — ABNORMAL HIGH (ref <1.0)

## 2022-04-17 NOTE — Patient Instructions (Addendum)
Your procedure is scheduled on: 04/28/22 - Monday ?Report to the Registration Desk on the 1st floor of the [facility]. ?To find out your arrival time, please call [phone] between 1PM - 3PM on: 04/25/22 - Friday ?If your arrival time is 6:00 am, do not arrive prior to that time as the [facility] entrance doors do not open until 6:00 am. ? ?REMEMBER: ?Instructions that are not followed completely may result in serious medical risk, up to and including death; or upon the discretion of your surgeon and anesthesiologist your surgery may need to be rescheduled. ? ?Do not eat food after midnight the night before surgery.  ?No gum chewing, lozengers or hard candies. ? ?You may however, drink CLEAR liquids up to 2 hours before you are scheduled to arrive for your surgery. Do not drink anything within 2 hours of your scheduled arrival time. ? ?Clear liquids include: ?- water  ?- apple juice without pulp ?- gatorade (not RED colors) ?- black coffee or tea (Do NOT add milk or creamers to the coffee or tea) ?Do NOT drink anything that is not on this list. ? ?In addition, your doctor has ordered for you to drink the provided  ?Ensure Pre-Surgery Clear Carbohydrate Drink  ?Drinking this carbohydrate drink up to two hours before surgery helps to reduce insulin resistance and improve patient outcomes. Please complete drinking 2 hours prior to scheduled arrival time. ? ?TAKE THESE MEDICATIONS THE MORNING OF SURGERY WITH A SIP OF WATER: ? ?- pantoprazole (PROTONIX) 40 MG tablet, (take one the night before and one on the morning of surgery - helps to prevent nausea after surgery.) ?- estrogens, conjugated, (PREMARIN) 1.25 MG tablet ?- levothyroxine (SYNTHROID, LEVOTHROID) 50 MCG tablet ?- predniSONE (DELTASONE) 10 MG tablet ?- propranolol ER (INDERAL LA) 60 MG 24 hr capsule ?- sertraline (ZOLOFT) 50 MG tablet ? ?Use albuterol (PROVENTIL HFA;VENTOLIN HFA) 108 (90 Base) MCG/ACT  on the day of surgery and bring to the  hospital. ? ?One week prior to surgery: ?Stop Anti-inflammatories (NSAIDS) such as Advil, Aleve, Ibuprofen, Motrin, Naproxen, Naprosyn and Aspirin based products such as Excedrin, Goodys Powder, BC Powder. ? ?Stop ANY OVER THE COUNTER supplements until after surgery. cholecalciferol (VITAMIN D3), folic acid (FOLVITE) 790 MCG tablet, vitamin B-12 , Multiple Vitamins-Minerals  ? ?You take Tylenol if needed for pain up until the day of surgery. ? ?No Alcohol for 24 hours before or after surgery. ? ?No Smoking including e-cigarettes for 24 hours prior to surgery.  ?No chewable tobacco products for at least 6 hours prior to surgery.  ?No nicotine patches on the day of surgery. ? ?Do not use any "recreational" drugs for at least a week prior to your surgery.  ?Please be advised that the combination of cocaine and anesthesia may have negative outcomes, up to and including death. ?If you test positive for cocaine, your surgery will be cancelled. ? ?On the morning of surgery brush your teeth with toothpaste and water, you may rinse your mouth with mouthwash if you wish. ?Do not swallow any toothpaste or mouthwash. ? ?Use CHG Soap or wipes as directed on instruction sheet. ? ?Do not wear jewelry, make-up, hairpins, clips or nail polish. ? ?Do not wear lotions, powders, or perfumes.  ? ?Do not shave body from the neck down 48 hours prior to surgery just in case you cut yourself which could leave a site for infection.  ?Also, freshly shaved skin may become irritated if using the CHG soap. ? ?Contact  lenses, hearing aids and dentures may not be worn into surgery. ? ?Do not bring valuables to the hospital. [facility] is not responsible for any missing/lost belongings or valuables.  ? ?Notify your doctor if there is any change in your medical condition (cold, fever, infection). ? ?Wear comfortable clothing (specific to your surgery type) to the hospital. ? ?After surgery, you can help prevent lung complications by doing breathing  exercises.  ?Take deep breaths and cough every 1-2 hours. Your doctor may order a device called an Incentive Spirometer to help you take deep breaths. ?When coughing or sneezing, hold a pillow firmly against your incision with both hands. This is called ?splinting.? Doing this helps protect your incision. It also decreases belly discomfort. ? ?If you are being admitted to the hospital overnight, leave your suitcase in the car. ?After surgery it may be brought to your room. ? ?If you are being discharged the day of surgery, you will not be allowed to drive home. ?You will need a responsible adult (18 years or older) to drive you home and stay with you that night.  ? ?If you are taking public transportation, you will need to have a responsible adult (18 years or older) with you. ?Please confirm with your physician that it is acceptable to use public transportation.  ? ?Please call the [facility] Dept. at [phone] if you have any questions about these instructions. ? ?Surgery Visitation Policy: ? ?Patients undergoing a surgery or procedure may have two family members or support persons with them as long as the person is not COVID-19 positive or experiencing its symptoms.  ? ?Inpatient Visitation:   ? ?Visiting hours are 7 a.m. to 8 p.m. ?Up to four visitors are allowed at one time in a patient room, including children. The visitors may rotate out with other people during the day. One designated support person (adult) may remain overnight.  ?

## 2022-04-18 DIAGNOSIS — M1711 Unilateral primary osteoarthritis, right knee: Secondary | ICD-10-CM | POA: Diagnosis not present

## 2022-04-23 LAB — IGE: IgE (Immunoglobulin E), Serum: 14 IU/mL (ref 6–495)

## 2022-04-27 NOTE — H&P (Signed)
ORTHOPAEDIC HISTORY & PHYSICAL Gwenlyn Fudge, [place] - 04/18/2022 1:45 PM EDT Formatting of this note is different from the original. [facility] Chief Complaint:   Chief Complaint  Patient presents with   H&P- RT TKA-04/28/22- [facility]   History of Present Illness:   [name] is a 81 y.o. female that presents to clinic today for her preoperative history and evaluation. Patient presents with her husband. The patient is scheduled to undergo a right total knee arthroplasty on 04/28/22 by Dr. Marry Guan. Her pain began many years ago. The pain is located primarily along the medial aspect of the knee. She describes her pain as worse with weightbearing. She reports associated swelling with some locking and giving way of the knee. She denies associated numbness or tingling.   The patient's symptoms have progressed to the point that they decrease her quality of life. The patient has previously undergone conservative treatment including NSAIDS and injections to the knee without adequate control of her symptoms.  Denies history of lumbar surgery, denies history of DVT, seen by Dr Ubaldo Glassing to rule out cardiac cause of previous CVA. 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  Husband will be at home to help post-operatively.   Reports allergy to latex.   Pre-op labs show mild hypokalemia.   Past Medical, Surgical, Family, Social History, Allergies, Medications:   Past Medical History:  Past Medical History:  Diagnosis Date   Adenomatous colon polyp  removed 2005   Anxiety   Arthritis   Asthma without status asthmaticus   Chronic pancreatitis (CMS-HCC)   Environmental allergies   History of urticaria   Hypertension   Hypertriglyceridemia   Hypokalemia   Nephrolithiasis 2003  passed spontaneously.   Non-cardiac chest pain   Past Surgical History:  Past Surgical History:  Procedure Laterality Date   HYSTERECTOMY 1963  left oophorectomy    APPENDECTOMY 1963   OOPHORECTOMY Right 1965   CARPAL TUNNEL RELEASE   Multiple sinus surgeries  At least five. 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   OOPHORECTOMY   Surgery of the foot and hand.   Current Medications:  Current Outpatient Medications  Medication Sig Dispense Refill   albuterol (PROAIR HFA) 90 mcg/actuation inhaler Inhale 2 inhalations into the lungs every 6 (six) hours as needed for Wheezing 1 each 3   aspirin 325 MG tablet Take 325 mg by mouth 2 (two) times daily   cholecalciferol (VITAMIN D3) 1000 unit tablet Take by mouth   cyanocobalamin (VITAMIN B12) 1000 MCG tablet Take 1,000 mcg by mouth once daily   folic acid (FOLVITE) 1 MG tablet Take 4 mg by mouth once daily.    gabapentin (NEURONTIN) 300 MG capsule Take 300 mg by mouth 3 (three) times daily   hydrOXYchloroQUINE (PLAQUENIL) 200 mg tablet Take 1 tablet (200 mg total) by mouth once daily 90 tablet 1   Lactobacillus acidophilus (PROBIOTIC ORAL) Take by mouth   levothyroxine (SYNTHROID) 50 MCG tablet TAKE 1 TABLET ONCE DAILY ON AN EMPTY STOMACH WITH A GLASS OF WATER AT LEAST 30-60 MINUTES BEFORE BREAKFAST 90 tablet 1   losartan (COZAAR) 50 MG tablet TAKE 1 TABLET EVERY DAY 90 tablet 0   MAGNESIUM ORAL Take by mouth once daily   montelukast (SINGULAIR) 10 mg tablet TAKE 1 TABLET (10 MG TOTAL) BY MOUTH NIGHTLY. 90 tablet 3   multivitamin tablet Take 1 tablet by mouth once daily   pantoprazole (PROTONIX) 40 MG  DR tablet TAKE 1 TABLET TWICE DAILY 180 tablet 1   potassium chloride (KLOR-CON) 20 MEQ ER tablet Take 1 tablet (20 mEq total) by mouth 3 (three) times daily 30 tablet 0   predniSONE (DELTASONE) 10 MG tablet TAKE 1 TABLET BY MOUTH EVERY DAY 30 tablet 2   PREMARIN 1.25 mg tablet TAKE 1 TABLET (1.25 MG TOTAL) BY MOUTH ONCE DAILY 90 tablet 1   propranoloL (INDERAL LA) 60 MG LA capsule Take 1 capsule (60 mg total) by mouth once daily 90 capsule 3   sertraline (ZOLOFT) 50 MG tablet  TAKE 1 TABLET BY MOUTH EVERY DAY 90 tablet 1   No current facility-administered medications for this visit.   Allergies:  Allergies  Allergen Reactions   Iodinated Contrast Media Other (See Comments)  "Seizure"   Other Unknown  Petroleum, Chemicals, and Paints   Muscle Relaxers too    Petroleum Distillates Shortness Of Breath  Passes out   Sulfa (Sulfonamide Antibiotics) Rash   Latex Other (See Comments)  Skin will tear off   Nylon Rash   Polyester Rash   Social History:  Social History   Socioeconomic History   Marital status: Married  Spouse name: [name]   Number of children: 1   Years of education: 54  Occupational History   Occupation: Retired  Tobacco Use   Smoking status: Never   Smokeless tobacco: Never  Scientific laboratory technician Use: Never used  Substance and Sexual Activity   Alcohol use: Not Currently  Comment: rare   Drug use: Not Currently   Sexual activity: Not Currently  Social History Narrative  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   Family History:  Family History  Problem Relation Age of Onset   Myocardial Infarction (Heart attack) Father 61   Ovarian cancer Mother   Irritable bowel syndrome Mother   Cancer Maternal Grandmother   Review of Systems:   A 10+ ROS was performed, reviewed, and the pertinent orthopaedic findings are documented in the HPI.   Physical Examination:   BP (!) 156/72 (BP Location: Left upper arm, Patient Position: Sitting, BP Cuff Size: Adult)  Ht 149.9 cm ('4\' 11"'$ )  Wt 51.2 kg (112 lb 12.8 oz)  BMI 22.78 kg/m   Patient is a well-developed, well-nourished female in no acute distress. Patient has normal mood and affect. Patient is alert and oriented to person, place, and time.   HEENT: Atraumatic, normocephalic. Pupils equal and reactive to light. Extraocular motion intact. Noninjected sclera.  Cardiovascular: Regular rate and rhythm, with no murmurs, rubs, or gallops. Distal pulses  palpable.  Respiratory: Lungs clear to auscultation bilaterally.     Right Knee: Soft tissue swelling: mild Effusion: none Erythema: none Crepitance: mild Tenderness: medial Alignment: relative varus Mediolateral laxity: medial pseudolaxity Posterior sag: negative Patellar tracking: Good tracking without evidence of subluxation or tilt Atrophy: No significant atrophy.  Quadriceps tone was fair to good. Range of motion: 0/3/127 degrees   Able to flex and extend toes. Able to plantarflex and dorsiflex ankle.  Sensation intact over the saphenous, lateral sural cutaneous, superficial fibular, and deep fibular nerve distributions.  Tests Performed/Reviewed:  X-rays  3 views of the right knee were obtained. Images reveal severe loss of medial compartment joint space with significant osteophyte formation. No fractures or dislocations. No osseous abnormality noted.  I personally ordered and reviewed today's xrays.   Impression:   ICD-10-CM  1. Primary osteoarthritis of right knee M17.11  Plan:   The patient has end-stage degenerative changes of the right knee. It was explained to the patient that the condition is progressive in nature. Having failed conservative treatment, the patient has elected to proceed with a total joint arthroplasty. The patient will undergo a total joint arthroplasty with Dr. Marry Guan. The risks of surgery, including blood clot and infection, were discussed with the patient. Measures to reduce these risks, including the use of anticoagulation, perioperative antibiotics, and early ambulation were discussed. The importance of postoperative physical therapy was discussed with the patient. The patient elects to proceed with surgery. The patient is instructed to stop all blood thinners prior to surgery. The patient is instructed to call the hospital the day before surgery to learn of the proper arrival time.   Rx sent in for Klor-Con 20 mEq to be taken TID until day  of surgery.   Contact our office with any questions or concerns. Follow up as indicated, or sooner should any new problems arise, if conditions worsen, or if they are otherwise concerned.   Gwenlyn Fudge, [facility] [place], [place] [address] Phone: [phone]  This note was generated in part with voice recognition software and I apologize for any typographical errors that were not detected and corrected.  Electronically signed by Gwenlyn Fudge, PA at 04/18/2022 2:52 PM EDT

## 2022-04-28 ENCOUNTER — Ambulatory Visit: Payer: Medicare HMO | Admitting: Urgent Care

## 2022-04-28 ENCOUNTER — Observation Stay: Payer: Medicare HMO

## 2022-04-28 ENCOUNTER — Encounter
Admission: RE | Disposition: A | Discharge status: Home Health | Payer: Self-pay | Source: Home / Self Care | Attending: Orthopedic Surgery

## 2022-04-28 ENCOUNTER — Other Ambulatory Visit: Payer: Self-pay

## 2022-04-28 ENCOUNTER — Encounter: Payer: Self-pay | Admitting: Orthopedic Surgery

## 2022-04-28 ENCOUNTER — Observation Stay
Admission: RE | Admit: 2022-04-28 | Discharge: 2022-04-29 | Disposition: A | Discharge status: Home Health | Payer: Medicare HMO | Attending: Orthopedic Surgery | Admitting: Orthopedic Surgery

## 2022-04-28 ENCOUNTER — Ambulatory Visit: Payer: Medicare HMO | Admitting: Anesthesiology

## 2022-04-28 DIAGNOSIS — Z7982 Long term (current) use of aspirin: Secondary | ICD-10-CM | POA: Diagnosis not present

## 2022-04-28 DIAGNOSIS — I10 Essential (primary) hypertension: Secondary | ICD-10-CM | POA: Diagnosis not present

## 2022-04-28 DIAGNOSIS — E039 Hypothyroidism, unspecified: Secondary | ICD-10-CM | POA: Insufficient documentation

## 2022-04-28 DIAGNOSIS — Z96651 Presence of right artificial knee joint: Secondary | ICD-10-CM | POA: Diagnosis not present

## 2022-04-28 DIAGNOSIS — M778 Other enthesopathies, not elsewhere classified: Secondary | ICD-10-CM | POA: Diagnosis not present

## 2022-04-28 DIAGNOSIS — M1711 Unilateral primary osteoarthritis, right knee: Secondary | ICD-10-CM | POA: Diagnosis not present

## 2022-04-28 DIAGNOSIS — Z419 Encounter for procedure for purposes other than remedying health state, unspecified: Secondary | ICD-10-CM

## 2022-04-28 DIAGNOSIS — J45909 Unspecified asthma, uncomplicated: Secondary | ICD-10-CM | POA: Diagnosis not present

## 2022-04-28 DIAGNOSIS — Z96659 Presence of unspecified artificial knee joint: Secondary | ICD-10-CM

## 2022-04-28 DIAGNOSIS — M1990 Unspecified osteoarthritis, unspecified site: Secondary | ICD-10-CM

## 2022-04-28 DIAGNOSIS — Z471 Aftercare following joint replacement surgery: Secondary | ICD-10-CM | POA: Diagnosis not present

## 2022-04-28 DIAGNOSIS — Z872 Personal history of diseases of the skin and subcutaneous tissue: Secondary | ICD-10-CM | POA: Insufficient documentation

## 2022-04-28 DIAGNOSIS — Z91040 Latex allergy status: Secondary | ICD-10-CM | POA: Diagnosis not present

## 2022-04-28 DIAGNOSIS — Z8673 Personal history of transient ischemic attack (TIA), and cerebral infarction without residual deficits: Secondary | ICD-10-CM | POA: Diagnosis not present

## 2022-04-28 DIAGNOSIS — Z79899 Other long term (current) drug therapy: Secondary | ICD-10-CM | POA: Insufficient documentation

## 2022-04-28 HISTORY — PX: KNEE ARTHROPLASTY: SHX992

## 2022-04-28 LAB — ABO/RH: ABO/RH(D): O POS

## 2022-04-28 SURGERY — ARTHROPLASTY, KNEE, TOTAL, USING IMAGELESS COMPUTER-ASSISTED NAVIGATION
Anesthesia: General | Site: Knee | Laterality: Right

## 2022-04-28 MED ORDER — KETAMINE HCL 10 MG/ML IJ SOLN
INTRAMUSCULAR | Status: DC | PRN
  Administered 2022-04-28: 20 mg via INTRAVENOUS

## 2022-04-28 MED ORDER — LOSARTAN POTASSIUM 50 MG PO TABS
50.0000 mg | ORAL_TABLET | Freq: Every day | ORAL | Status: DC
  Administered 2022-04-29: 50 mg via ORAL
  Filled 2022-04-28: qty 1

## 2022-04-28 MED ORDER — LABETALOL HCL 5 MG/ML IV SOLN
INTRAVENOUS | Status: DC | PRN
  Administered 2022-04-28: 5 mg via INTRAVENOUS

## 2022-04-28 MED ORDER — DIPHENHYDRAMINE HCL 12.5 MG/5ML PO ELIX: 12.5000 mg | ORAL_SOLUTION | ORAL | Status: DC | PRN

## 2022-04-28 MED ORDER — METOCLOPRAMIDE HCL 10 MG PO TABS
10.0000 mg | ORAL_TABLET | Freq: Three times a day (TID) | ORAL | Status: DC
Start: 2022-04-28 — End: 2022-04-29
  Administered 2022-04-28 – 2022-04-29 (×3): 10 mg via ORAL
  Filled 2022-04-28 (×8): qty 1

## 2022-04-28 MED ORDER — CEFAZOLIN SODIUM-DEXTROSE 2-4 GM/100ML-% IV SOLN
INTRAVENOUS | Status: AC
  Filled 2022-04-28: qty 100

## 2022-04-28 MED ORDER — RISAQUAD PO CAPS
1.0000 | ORAL_CAPSULE | Freq: Every day | ORAL | Status: DC
  Administered 2022-04-29: 1 via ORAL
  Filled 2022-04-28: qty 1

## 2022-04-28 MED ORDER — OXYCODONE HCL 5 MG PO TABS: 5.0000 mg | ORAL_TABLET | Freq: Once | ORAL | Status: DC | PRN

## 2022-04-28 MED ORDER — PROPRANOLOL HCL ER 60 MG PO CP24
60.0000 mg | ORAL_CAPSULE | Freq: Every day | ORAL | Status: DC
  Administered 2022-04-29: 60 mg via ORAL
  Filled 2022-04-28: qty 1

## 2022-04-28 MED ORDER — BISACODYL 10 MG RE SUPP: 10.0000 mg | Freq: Every day | RECTAL | Status: DC | PRN

## 2022-04-28 MED ORDER — XLEAR SINUS CARE SPRAY NA SOLN
1.0000 | Freq: Every day | NASAL | Status: DC | PRN
Start: 2022-04-28 — End: 2022-04-28

## 2022-04-28 MED ORDER — OXYCODONE HCL 5 MG/5ML PO SOLN: 5.0000 mg | Freq: Once | ORAL | Status: DC | PRN

## 2022-04-28 MED ORDER — OXYCODONE HCL 5 MG PO TABS
10.0000 mg | ORAL_TABLET | ORAL | Status: DC | PRN
  Administered 2022-04-28 – 2022-04-29 (×2): 10 mg via ORAL
  Filled 2022-04-28 (×2): qty 2

## 2022-04-28 MED ORDER — HYDRALAZINE HCL 20 MG/ML IJ SOLN: 5.0000 mg | Freq: Once | INTRAMUSCULAR | Status: AC

## 2022-04-28 MED ORDER — TRANEXAMIC ACID-NACL 1000-0.7 MG/100ML-% IV SOLN
1000.0000 mg | INTRAVENOUS | Status: AC
  Administered 2022-04-28: 1000 mg via INTRAVENOUS

## 2022-04-28 MED ORDER — VITAMIN D 25 MCG (1000 UNIT) PO TABS
1000.0000 [IU] | ORAL_TABLET | Freq: Every day | ORAL | Status: DC
  Administered 2022-04-29: 1000 [IU] via ORAL
  Filled 2022-04-28: qty 1

## 2022-04-28 MED ORDER — CHLORHEXIDINE GLUCONATE 0.12 % MT SOLN: 15.0000 mL | Freq: Once | OROMUCOSAL | Status: AC

## 2022-04-28 MED ORDER — ESTROGENS CONJUGATED 0.625 MG PO TABS
1.2500 mg | ORAL_TABLET | Freq: Every day | ORAL | Status: DC
  Administered 2022-04-29: 1.25 mg via ORAL
  Filled 2022-04-28: qty 1
  Filled 2022-04-28: qty 2

## 2022-04-28 MED ORDER — PANTOPRAZOLE SODIUM 40 MG PO TBEC
40.0000 mg | DELAYED_RELEASE_TABLET | Freq: Two times a day (BID) | ORAL | Status: DC
  Administered 2022-04-28 – 2022-04-29 (×2): 40 mg via ORAL
  Filled 2022-04-28 (×2): qty 1

## 2022-04-28 MED ORDER — ACETAMINOPHEN 10 MG/ML IV SOLN
1000.0000 mg | Freq: Four times a day (QID) | INTRAVENOUS | Status: DC
  Administered 2022-04-28 – 2022-04-29 (×3): 1000 mg via INTRAVENOUS
  Filled 2022-04-28 (×4): qty 100

## 2022-04-28 MED ORDER — TRANEXAMIC ACID-NACL 1000-0.7 MG/100ML-% IV SOLN
INTRAVENOUS | Status: AC
  Administered 2022-04-28: 1000 mg via INTRAVENOUS
  Filled 2022-04-28: qty 100

## 2022-04-28 MED ORDER — SURGIPHOR WOUND IRRIGATION SYSTEM - OPTIME
TOPICAL | Status: DC | PRN
  Administered 2022-04-28: 1 via TOPICAL

## 2022-04-28 MED ORDER — ONDANSETRON HCL 4 MG/2ML IJ SOLN
INTRAMUSCULAR | Status: DC | PRN
  Administered 2022-04-28: 4 mg via INTRAVENOUS

## 2022-04-28 MED ORDER — FENTANYL CITRATE (PF) 100 MCG/2ML IJ SOLN
INTRAMUSCULAR | Status: AC
  Administered 2022-04-28: 25 ug via INTRAVENOUS
  Filled 2022-04-28: qty 2

## 2022-04-28 MED ORDER — DEXAMETHASONE SODIUM PHOSPHATE 10 MG/ML IJ SOLN: 8.0000 mg | Freq: Once | INTRAMUSCULAR | Status: AC

## 2022-04-28 MED ORDER — KETAMINE HCL 50 MG/5ML IJ SOSY
PREFILLED_SYRINGE | INTRAMUSCULAR | Status: AC
  Filled 2022-04-28: qty 5

## 2022-04-28 MED ORDER — SENNOSIDES-DOCUSATE SODIUM 8.6-50 MG PO TABS
1.0000 | ORAL_TABLET | Freq: Two times a day (BID) | ORAL | Status: DC
  Administered 2022-04-28 – 2022-04-29 (×2): 1 via ORAL
  Filled 2022-04-28 (×2): qty 1

## 2022-04-28 MED ORDER — HYDROXYCHLOROQUINE SULFATE 200 MG PO TABS
200.0000 mg | ORAL_TABLET | Freq: Every day | ORAL | Status: DC
  Administered 2022-04-29: 200 mg via ORAL
  Filled 2022-04-28: qty 1

## 2022-04-28 MED ORDER — FLEET ENEMA 7-19 GM/118ML RE ENEM: 1.0000 | ENEMA | Freq: Once | RECTAL | Status: DC | PRN

## 2022-04-28 MED ORDER — SODIUM CHLORIDE 0.9 % IR SOLN
Status: DC | PRN
  Administered 2022-04-28: 3000 mL

## 2022-04-28 MED ORDER — DEXAMETHASONE SODIUM PHOSPHATE 10 MG/ML IJ SOLN
INTRAMUSCULAR | Status: DC | PRN
  Administered 2022-04-28: 8 mg via INTRAVENOUS

## 2022-04-28 MED ORDER — TRANEXAMIC ACID-NACL 1000-0.7 MG/100ML-% IV SOLN
INTRAVENOUS | Status: AC
  Filled 2022-04-28: qty 100

## 2022-04-28 MED ORDER — SODIUM CHLORIDE 0.9 % IV SOLN
INTRAVENOUS | Status: DC | PRN
  Administered 2022-04-28: 60 mL

## 2022-04-28 MED ORDER — ENSURE PRE-SURGERY PO LIQD
296.0000 mL | Freq: Once | ORAL | Status: AC
  Administered 2022-04-28: 296 mL via ORAL
  Filled 2022-04-28: qty 296

## 2022-04-28 MED ORDER — POLYVINYL ALCOHOL 1.4 % OP SOLN: 1.0000 [drp] | Freq: Four times a day (QID) | OPHTHALMIC | Status: DC | PRN

## 2022-04-28 MED ORDER — LABETALOL HCL 5 MG/ML IV SOLN
INTRAVENOUS | Status: AC
  Filled 2022-04-28: qty 4

## 2022-04-28 MED ORDER — 0.9 % SODIUM CHLORIDE (POUR BTL) OPTIME
TOPICAL | Status: DC | PRN
  Administered 2022-04-28: 500 mL

## 2022-04-28 MED ORDER — ACETAMINOPHEN 325 MG PO TABS: 325.0000 mg | ORAL_TABLET | Freq: Four times a day (QID) | ORAL | Status: DC | PRN

## 2022-04-28 MED ORDER — ACETAMINOPHEN 10 MG/ML IV SOLN
INTRAVENOUS | Status: AC
  Filled 2022-04-28: qty 100

## 2022-04-28 MED ORDER — CEFAZOLIN SODIUM-DEXTROSE 2-4 GM/100ML-% IV SOLN
2.0000 g | INTRAVENOUS | Status: AC
  Administered 2022-04-28: 2 g via INTRAVENOUS

## 2022-04-28 MED ORDER — HYDROMORPHONE HCL 1 MG/ML IJ SOLN: 0.5000 mg | INTRAMUSCULAR | Status: DC | PRN

## 2022-04-28 MED ORDER — DEXAMETHASONE SODIUM PHOSPHATE 10 MG/ML IJ SOLN
INTRAMUSCULAR | Status: AC
  Filled 2022-04-28: qty 1

## 2022-04-28 MED ORDER — CEFAZOLIN SODIUM-DEXTROSE 2-4 GM/100ML-% IV SOLN
2.0000 g | Freq: Three times a day (TID) | INTRAVENOUS | Status: AC
  Administered 2022-04-28 (×2): 2 g via INTRAVENOUS
  Filled 2022-04-28 (×2): qty 100

## 2022-04-28 MED ORDER — ALUM & MAG HYDROXIDE-SIMETH 200-200-20 MG/5ML PO SUSP: 30.0000 mL | ORAL | Status: DC | PRN

## 2022-04-28 MED ORDER — FENTANYL CITRATE (PF) 100 MCG/2ML IJ SOLN
INTRAMUSCULAR | Status: AC
  Filled 2022-04-28: qty 2

## 2022-04-28 MED ORDER — GABAPENTIN 300 MG PO CAPS: 300.0000 mg | ORAL_CAPSULE | Freq: Once | ORAL | Status: AC

## 2022-04-28 MED ORDER — FERROUS SULFATE 325 (65 FE) MG PO TABS
325.0000 mg | ORAL_TABLET | Freq: Every day | ORAL | Status: DC
Start: 2022-04-29 — End: 2022-04-29
  Administered 2022-04-29: 325 mg via ORAL
  Filled 2022-04-28: qty 1

## 2022-04-28 MED ORDER — CELECOXIB 200 MG PO CAPS
ORAL_CAPSULE | ORAL | Status: AC
  Administered 2022-04-28: 400 mg via ORAL
  Filled 2022-04-28: qty 2

## 2022-04-28 MED ORDER — HYDROMORPHONE HCL 1 MG/ML IJ SOLN
INTRAMUSCULAR | Status: AC
  Filled 2022-04-28: qty 1

## 2022-04-28 MED ORDER — HYDRALAZINE HCL 20 MG/ML IJ SOLN
INTRAMUSCULAR | Status: AC
  Administered 2022-04-28: 5 mg
  Filled 2022-04-28: qty 1

## 2022-04-28 MED ORDER — ACETAMINOPHEN 10 MG/ML IV SOLN
INTRAVENOUS | Status: DC | PRN
Start: 2022-04-28 — End: 2022-04-28
  Administered 2022-04-28: 1000 mg via INTRAVENOUS

## 2022-04-28 MED ORDER — BUPIVACAINE HCL (PF) 0.25 % IJ SOLN
INTRAMUSCULAR | Status: DC | PRN
  Administered 2022-04-28: 60 mL

## 2022-04-28 MED ORDER — NEOMYCIN-POLYMYXIN B GU 40-200000 IR SOLN
Status: DC | PRN
  Administered 2022-04-28: 14 mL

## 2022-04-28 MED ORDER — FOLIC ACID 1 MG PO TABS
1000.0000 ug | ORAL_TABLET | Freq: Every day | ORAL | Status: DC
  Administered 2022-04-29: 1 mg via ORAL
  Filled 2022-04-28: qty 1

## 2022-04-28 MED ORDER — LACTATED RINGERS IV SOLN: INTRAVENOUS | Status: DC

## 2022-04-28 MED ORDER — FENTANYL CITRATE (PF) 100 MCG/2ML IJ SOLN
INTRAMUSCULAR | Status: DC | PRN
Start: 2022-04-28 — End: 2022-04-28
  Administered 2022-04-28: 50 ug via INTRAVENOUS

## 2022-04-28 MED ORDER — HYDRALAZINE HCL 20 MG/ML IJ SOLN
INTRAMUSCULAR | Status: DC | PRN
  Administered 2022-04-28: 5 mg via INTRAVENOUS

## 2022-04-28 MED ORDER — PROPOFOL 10 MG/ML IV BOLUS
INTRAVENOUS | Status: DC | PRN
  Administered 2022-04-28: 100 mg via INTRAVENOUS

## 2022-04-28 MED ORDER — PROPOFOL 10 MG/ML IV BOLUS
INTRAVENOUS | Status: AC
  Filled 2022-04-28: qty 20

## 2022-04-28 MED ORDER — ROCURONIUM BROMIDE 100 MG/10ML IV SOLN
INTRAVENOUS | Status: DC | PRN
  Administered 2022-04-28: 20 mg via INTRAVENOUS
  Administered 2022-04-28: 50 mg via INTRAVENOUS

## 2022-04-28 MED ORDER — LEVOTHYROXINE SODIUM 50 MCG PO TABS
50.0000 ug | ORAL_TABLET | Freq: Every day | ORAL | Status: DC
  Administered 2022-04-29: 50 ug via ORAL
  Filled 2022-04-28: qty 1

## 2022-04-28 MED ORDER — LIDOCAINE HCL (PF) 2 % IJ SOLN
INTRAMUSCULAR | Status: AC
  Filled 2022-04-28: qty 5

## 2022-04-28 MED ORDER — ORAL CARE MOUTH RINSE: 15.0000 mL | Freq: Once | OROMUCOSAL | Status: AC

## 2022-04-28 MED ORDER — ENOXAPARIN SODIUM 30 MG/0.3ML IJ SOSY
30.0000 mg | PREFILLED_SYRINGE | Freq: Two times a day (BID) | INTRAMUSCULAR | Status: DC
  Administered 2022-04-29: 30 mg via SUBCUTANEOUS
  Filled 2022-04-28: qty 0.3

## 2022-04-28 MED ORDER — CELECOXIB 200 MG PO CAPS
200.0000 mg | ORAL_CAPSULE | Freq: Two times a day (BID) | ORAL | Status: DC
  Administered 2022-04-28 – 2022-04-29 (×2): 200 mg via ORAL
  Filled 2022-04-28 (×2): qty 1

## 2022-04-28 MED ORDER — PROPOFOL 1000 MG/100ML IV EMUL
INTRAVENOUS | Status: AC
  Filled 2022-04-28: qty 100

## 2022-04-28 MED ORDER — SODIUM CHLORIDE 0.9 % IV SOLN: INTRAVENOUS | Status: DC

## 2022-04-28 MED ORDER — FENTANYL CITRATE (PF) 100 MCG/2ML IJ SOLN
25.0000 ug | INTRAMUSCULAR | Status: DC | PRN
  Administered 2022-04-28: 25 ug via INTRAVENOUS

## 2022-04-28 MED ORDER — PHENYLEPHRINE HCL-NACL 20-0.9 MG/250ML-% IV SOLN
INTRAVENOUS | Status: AC
  Filled 2022-04-28: qty 250

## 2022-04-28 MED ORDER — MENTHOL 3 MG MT LOZG: 1.0000 | LOZENGE | OROMUCOSAL | Status: DC | PRN

## 2022-04-28 MED ORDER — GABAPENTIN 300 MG PO CAPS
300.0000 mg | ORAL_CAPSULE | Freq: Three times a day (TID) | ORAL | Status: DC
  Administered 2022-04-28 – 2022-04-29 (×3): 300 mg via ORAL
  Filled 2022-04-28 (×3): qty 1

## 2022-04-28 MED ORDER — TRANEXAMIC ACID-NACL 1000-0.7 MG/100ML-% IV SOLN: 1000.0000 mg | Freq: Once | INTRAVENOUS | Status: AC

## 2022-04-28 MED ORDER — HYDROMORPHONE HCL 1 MG/ML IJ SOLN
INTRAMUSCULAR | Status: DC | PRN
  Administered 2022-04-28 (×2): .5 mg via INTRAVENOUS

## 2022-04-28 MED ORDER — SERTRALINE HCL 50 MG PO TABS
50.0000 mg | ORAL_TABLET | Freq: Every day | ORAL | Status: DC
  Administered 2022-04-29: 50 mg via ORAL
  Filled 2022-04-28: qty 1

## 2022-04-28 MED ORDER — CELECOXIB 200 MG PO CAPS
400.0000 mg | ORAL_CAPSULE | Freq: Once | ORAL | Status: AC
Start: 2022-04-28 — End: 2022-04-28

## 2022-04-28 MED ORDER — ALBUTEROL SULFATE HFA 108 (90 BASE) MCG/ACT IN AERS
INHALATION_SPRAY | RESPIRATORY_TRACT | Status: DC | PRN
  Administered 2022-04-28: 4 via RESPIRATORY_TRACT

## 2022-04-28 MED ORDER — TRAMADOL HCL 50 MG PO TABS: 50.0000 mg | ORAL_TABLET | ORAL | Status: DC | PRN

## 2022-04-28 MED ORDER — PHENOL 1.4 % MT LIQD: 1.0000 | OROMUCOSAL | Status: DC | PRN

## 2022-04-28 MED ORDER — MONTELUKAST SODIUM 10 MG PO TABS
10.0000 mg | ORAL_TABLET | Freq: Every day | ORAL | Status: DC
  Administered 2022-04-29: 10 mg via ORAL
  Filled 2022-04-28: qty 1

## 2022-04-28 MED ORDER — ALBUTEROL SULFATE (2.5 MG/3ML) 0.083% IN NEBU: 3.0000 mL | INHALATION_SOLUTION | RESPIRATORY_TRACT | Status: DC | PRN

## 2022-04-28 MED ORDER — ONDANSETRON HCL 4 MG/2ML IJ SOLN
INTRAMUSCULAR | Status: AC
  Filled 2022-04-28: qty 2

## 2022-04-28 MED ORDER — LIDOCAINE HCL (CARDIAC) PF 100 MG/5ML IV SOSY
PREFILLED_SYRINGE | INTRAVENOUS | Status: DC | PRN
  Administered 2022-04-28: 100 mg via INTRAVENOUS

## 2022-04-28 MED ORDER — ONDANSETRON HCL 4 MG PO TABS: 4.0000 mg | ORAL_TABLET | Freq: Four times a day (QID) | ORAL | Status: DC | PRN

## 2022-04-28 MED ORDER — ROCURONIUM BROMIDE 10 MG/ML (PF) SYRINGE
PREFILLED_SYRINGE | INTRAVENOUS | Status: AC
  Filled 2022-04-28: qty 10

## 2022-04-28 MED ORDER — CHLORHEXIDINE GLUCONATE 4 % EX LIQD: 60.0000 mL | Freq: Once | CUTANEOUS | Status: DC

## 2022-04-28 MED ORDER — ADULT MULTIVITAMIN W/MINERALS CH
1.0000 | ORAL_TABLET | Freq: Every day | ORAL | Status: DC
  Administered 2022-04-29: 1 via ORAL
  Filled 2022-04-28: qty 1

## 2022-04-28 MED ORDER — CHLORHEXIDINE GLUCONATE 0.12 % MT SOLN
OROMUCOSAL | Status: AC
  Administered 2022-04-28: 15 mL via OROMUCOSAL
  Filled 2022-04-28: qty 15

## 2022-04-28 MED ORDER — SUGAMMADEX SODIUM 200 MG/2ML IV SOLN
INTRAVENOUS | Status: DC | PRN
  Administered 2022-04-28: 125 mg via INTRAVENOUS

## 2022-04-28 MED ORDER — MAGNESIUM HYDROXIDE 400 MG/5ML PO SUSP
30.0000 mL | Freq: Every day | ORAL | Status: DC
  Administered 2022-04-29: 30 mL via ORAL
  Filled 2022-04-28: qty 30

## 2022-04-28 MED ORDER — VITAMIN B-12 1000 MCG PO TABS
1000.0000 ug | ORAL_TABLET | Freq: Every day | ORAL | Status: DC
  Administered 2022-04-29: 1000 ug via ORAL
  Filled 2022-04-28: qty 1

## 2022-04-28 MED ORDER — ONDANSETRON HCL 4 MG/2ML IJ SOLN
4.0000 mg | Freq: Four times a day (QID) | INTRAMUSCULAR | Status: DC | PRN
  Administered 2022-04-28: 4 mg via INTRAVENOUS
  Filled 2022-04-28: qty 2

## 2022-04-28 MED ORDER — GABAPENTIN 300 MG PO CAPS
ORAL_CAPSULE | ORAL | Status: AC
  Administered 2022-04-28: 300 mg via ORAL
  Filled 2022-04-28: qty 1

## 2022-04-28 MED ORDER — DEXAMETHASONE SODIUM PHOSPHATE 10 MG/ML IJ SOLN
INTRAMUSCULAR | Status: AC
  Administered 2022-04-28: 8 mg via INTRAVENOUS
  Filled 2022-04-28: qty 1

## 2022-04-28 MED ORDER — FENTANYL CITRATE (PF) 100 MCG/2ML IJ SOLN
INTRAMUSCULAR | Status: DC | PRN
  Administered 2022-04-28 (×2): 25 ug via INTRAVENOUS

## 2022-04-28 MED ORDER — OXYCODONE HCL 5 MG PO TABS: 5.0000 mg | ORAL_TABLET | ORAL | Status: DC | PRN

## 2022-04-28 MED ORDER — PREDNISONE 10 MG PO TABS
10.0000 mg | ORAL_TABLET | Freq: Every day | ORAL | Status: DC
  Administered 2022-04-29: 10 mg via ORAL
  Filled 2022-04-28: qty 1

## 2022-04-28 SURGICAL SUPPLY — 76 items
ATTUNE MED DOME PAT 32 KNEE (Knees) ×1 IMPLANT
ATTUNE PSFEM RTSZ4 NARCEM KNEE (Femur) ×1 IMPLANT
ATTUNE PSRP INSR SZ4 5 KNEE (Insert) ×1 IMPLANT
BASE TIBIAL ROT PLAT SZ 3 KNEE (Knees) IMPLANT
BATTERY INSTRU NAVIGATION (MISCELLANEOUS) ×8 IMPLANT
BLADE SAW 70X12.5 (BLADE) ×2 IMPLANT
BLADE SAW 90X13X1.19 OSCILLAT (BLADE) ×2 IMPLANT
BLADE SAW 90X25X1.19 OSCILLAT (BLADE) ×2 IMPLANT
BONE CEMENT GENTAMICIN (Cement) ×4 IMPLANT
BSPLAT TIB 3 CMNT ROT PLAT STR (Knees) ×1 IMPLANT
BTRY SRG DRVR LF (MISCELLANEOUS) ×4
CEMENT BONE GENTAMICIN 40 (Cement) IMPLANT
CEMENT HV SMART SET (Cement) IMPLANT
COOLER POLAR GLACIER W/PUMP (MISCELLANEOUS) ×2 IMPLANT
CUFF TOURN SGL QUICK 24 (TOURNIQUET CUFF)
CUFF TOURN SGL QUICK 34 (TOURNIQUET CUFF)
CUFF TRNQT CYL 24X4X16.5-23 (TOURNIQUET CUFF) IMPLANT
CUFF TRNQT CYL 34X4.125X (TOURNIQUET CUFF) IMPLANT
DRAPE 3/4 80X56 (DRAPES) ×2 IMPLANT
DRAPE INCISE IOBAN 66X45 STRL (DRAPES) IMPLANT
DRSG DERMACEA 8X12 NADH (GAUZE/BANDAGES/DRESSINGS) ×2 IMPLANT
DRSG MEPILEX SACRM 8.7X9.8 (GAUZE/BANDAGES/DRESSINGS) ×2 IMPLANT
DRSG OPSITE POSTOP 4X14 (GAUZE/BANDAGES/DRESSINGS) ×2 IMPLANT
DRSG TEGADERM 4X4.75 (GAUZE/BANDAGES/DRESSINGS) ×2 IMPLANT
DURAPREP 26ML APPLICATOR (WOUND CARE) ×4 IMPLANT
ELECT CAUTERY BLADE 6.4 (BLADE) ×2 IMPLANT
ELECT REM PT RETURN 9FT ADLT (ELECTROSURGICAL) ×2
ELECTRODE REM PT RTRN 9FT ADLT (ELECTROSURGICAL) ×1 IMPLANT
EX-PIN ORTHOLOCK NAV 4X150 (PIN) ×4 IMPLANT
GLOVE BIOGEL M STRL SZ7.5 (GLOVE) ×8 IMPLANT
GLOVE BIOGEL PI IND STRL 8 (GLOVE) ×1 IMPLANT
GLOVE BIOGEL PI INDICATOR 8 (GLOVE) ×1
GLOVE SURG UNDER POLY LF SZ7.5 (GLOVE) ×2 IMPLANT
GOWN STRL REUS W/ TWL LRG LVL3 (GOWN DISPOSABLE) ×2 IMPLANT
GOWN STRL REUS W/ TWL XL LVL3 (GOWN DISPOSABLE) ×1 IMPLANT
GOWN STRL REUS W/TWL LRG LVL3 (GOWN DISPOSABLE) ×4
GOWN STRL REUS W/TWL XL LVL3 (GOWN DISPOSABLE) ×2
HEMOVAC 400CC 10FR (MISCELLANEOUS) ×2 IMPLANT
HOLDER FOLEY CATH W/STRAP (MISCELLANEOUS) ×2 IMPLANT
HOLSTER ELECTROSUGICAL PENCIL (MISCELLANEOUS) ×2 IMPLANT
HOOD PEEL AWAY FLYTE STAYCOOL (MISCELLANEOUS) ×4 IMPLANT
IMMBOLIZER KNEE 19 BLUE UNIV (SOFTGOODS) ×1 IMPLANT
IV NS IRRIG 3000ML ARTHROMATIC (IV SOLUTION) ×2 IMPLANT
KIT TURNOVER KIT A (KITS) ×2 IMPLANT
KNIFE SCULPS 14X20 (INSTRUMENTS) ×2 IMPLANT
MANIFOLD NEPTUNE II (INSTRUMENTS) ×4 IMPLANT
NDL SPNL 20GX3.5 QUINCKE YW (NEEDLE) ×2 IMPLANT
NEEDLE SPNL 20GX3.5 QUINCKE YW (NEEDLE) ×4 IMPLANT
NS IRRIG 500ML POUR BTL (IV SOLUTION) ×2 IMPLANT
PACK TOTAL KNEE (MISCELLANEOUS) ×2 IMPLANT
PAD ABD DERMACEA PRESS 5X9 (GAUZE/BANDAGES/DRESSINGS) ×4 IMPLANT
PAD WRAPON POLAR KNEE (MISCELLANEOUS) ×1 IMPLANT
PIN DRILL FIX HALF THREAD (BIT) ×4 IMPLANT
PIN DRILL QUICK PACK ×4 IMPLANT
PIN FIXATION 1/8DIA X 3INL (PIN) ×2 IMPLANT
PULSAVAC PLUS IRRIG FAN TIP (DISPOSABLE) ×2
SOL PREP PVP 2OZ (MISCELLANEOUS) ×2
SOLUTION IRRIG SURGIPHOR (IV SOLUTION) ×2 IMPLANT
SOLUTION PREP PVP 2OZ (MISCELLANEOUS) ×1 IMPLANT
SPONGE DRAIN TRACH 4X4 STRL 2S (GAUZE/BANDAGES/DRESSINGS) ×2 IMPLANT
STAPLER SKIN PROX 35W (STAPLE) ×2 IMPLANT
STOCKINETTE IMPERV 14X48 (MISCELLANEOUS) ×1 IMPLANT
STRAP TIBIA SHORT (MISCELLANEOUS) ×2 IMPLANT
SUCTION FRAZIER HANDLE 10FR (MISCELLANEOUS) ×2
SUCTION TUBE FRAZIER 10FR DISP (MISCELLANEOUS) ×1 IMPLANT
SUT VIC AB 0 CT1 36 (SUTURE) ×4 IMPLANT
SUT VIC AB 1 CT1 36 (SUTURE) ×4 IMPLANT
SUT VIC AB 2-0 CT2 27 (SUTURE) ×2 IMPLANT
SYR 30ML LL (SYRINGE) ×4 IMPLANT
TIBIAL BASE ROT PLAT SZ 3 KNEE (Knees) ×2 IMPLANT
TIP FAN IRRIG PULSAVAC PLUS (DISPOSABLE) ×1 IMPLANT
TOWEL OR 17X26 4PK STRL BLUE (TOWEL DISPOSABLE) ×1 IMPLANT
TOWER CARTRIDGE SMART MIX (DISPOSABLE) ×2 IMPLANT
TRAY FOLEY MTR SLVR 16FR STAT (SET/KITS/TRAYS/PACK) ×2 IMPLANT
WATER STERILE IRR 1000ML POUR (IV SOLUTION) ×2 IMPLANT
WRAPON POLAR PAD KNEE (MISCELLANEOUS) ×2

## 2022-04-28 NOTE — Anesthesia Preprocedure Evaluation (Signed)
Anesthesia Evaluation  Patient identified by MRN, date of birth, ID band Patient awake    Reviewed: Allergy & Precautions, NPO status , Patient's Chart, lab work & pertinent test results  History of Anesthesia Complications (+) AWARENESS UNDER ANESTHESIA and history of anesthetic complications  Airway Mallampati: III  TM Distance: <3 FB Neck ROM: full    Dental  (+) Chipped, Poor Dentition, Missing, Caps   Pulmonary neg shortness of breath, asthma ,    Pulmonary exam normal        Cardiovascular Exercise Tolerance: Good hypertension, + angina Normal cardiovascular exam     Neuro/Psych Seizures -,  PSYCHIATRIC DISORDERS TIA Neuromuscular disease CVA    GI/Hepatic Neg liver ROS, hiatal hernia, GERD  Controlled,  Endo/Other  Hypothyroidism   Renal/GU Renal disease     Musculoskeletal   Abdominal   Peds  Hematology negative hematology ROS (+)   Anesthesia Other Findings Past Medical History: No date: Anemia No date: Arthritis No date: Asthma     Comment:  uses inhaler just prior to surgery to avoid attack No date: Back pain     Comment:  from previous injury No date: Complication of anesthesia     Comment:  has woken  up during 2 different surgery No date: Depression     Comment:  no current issue/treatment; situation No date: Gallstones No date: GERD (gastroesophageal reflux disease) No date: Hiatal hernia     Comment:  patient does NOT have nerve/muscle disease No date: History of kidney stones No date: HLD (hyperlipidemia) No date: HTN (hypertension) No date: Hypothyroidism No date: Kidney stones No date: Knee pain years: Non-diabetic pancreatic hormone dysfunction     Comment:  pt. 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 No date: Pancreatitis No date: Pneumonia No date: Seizures (HCC)     Comment:  caused by dye injected during a procedure No date: Shortness of breath     Comment:  with  exertion No date: Sinus problem     Comment:  frequent infections/congestion 2021: Stroke ([place])     Comment:  reports having CVA in 2021 and having mini strokes               before that No date: Thyroid disease  Past Surgical History: No date: ABDOMINAL HYSTERECTOMY No date: APPENDECTOMY 10+ years ago: CARPAL TUNNEL RELEASE     Comment:  bilateral 3 yrs ago: EYE SURGERY     Comment:  bilateral cataracts 6 weeks ago: FOOT OSTEOTOMY     Comment:  Left foot: great, 2nd & 3rd 5 years ago: FOOT OSTEOTOMY     Comment:  Right great toe 2011-most recent: HAND SURGERY; Bilateral     Comment:  multiple hand surgeries, 2 on left, 3 on right 05/16/2020: LOOP RECORDER INSERTION; N/A     Comment:  Procedure: LOOP RECORDER INSERTION;  Surgeon: Isaias Cowman, MD;  Location: [facility];  Service:              Cardiovascular;  Laterality: N/A; most recent 7-8 yrs ago: NASAL SINUS SURGERY     Comment:  7 sinus surgeries  11/19/2011: TRIGGER FINGER RELEASE     Comment:  Procedure: RELEASE TRIGGER FINGER/A-1 PULLEY;  Surgeon:               Wynonia Sours, MD;  Location: [facility];  Service: Orthopedics;  Laterality: Right;  release a-1               pulley right index finger and cyst removal 1980's: WRIST GANGLION EXCISION     Comment:  right     Reproductive/Obstetrics negative OB ROS                             Anesthesia Physical Anesthesia Plan  ASA: 3  Anesthesia Plan: Spinal   Post-op Pain Management:    Induction:   PONV Risk Score and Plan:   Airway Management Planned: Natural Airway and Nasal Cannula  Additional Equipment:   Intra-op Plan:   Post-operative Plan:   Informed Consent: I have reviewed the patients History and Physical, chart, labs and discussed the procedure including the risks, benefits and alternatives for the proposed anesthesia with the patient or authorized  representative who has indicated his/her understanding and acceptance.     Dental Advisory Given  Plan Discussed with: Anesthesiologist, CRNA and Surgeon  Anesthesia Plan Comments: (Patient reports no bleeding problems and no anticoagulant use.  Plan for spinal with backup GA  Patient consented for risks of anesthesia including but not limited to:  - adverse reactions to medications - damage to eyes, teeth, lips or other oral mucosa - nerve damage due to positioning  - risk of bleeding, infection and or nerve damage from spinal that could lead to paralysis - risk of headache or failed spinal - damage to teeth, lips or other oral mucosa - sore throat or hoarseness - damage to heart, brain, nerves, lungs, other parts of body or loss of life  Patient voiced understanding.)        Anesthesia Quick Evaluation

## 2022-04-28 NOTE — Evaluation (Signed)
Physical Therapy Evaluation Patient Details Name: [name] MRN: [id number] DOB: 1941/02/04 Today's Date: 04/28/2022  History of Present Illness  Pt is a 81 yo F diagnosed with degenerative arthrosis of the right knee and is s/p elective R TKA.  PMH includes: anxiety, HTN, hypokalemia, angina, and CVA.   Clinical Impression  Pt was pleasant and motivated to participate during the session and put forth good effort throughout. Pt required no physical assistance with bed mobility or ambulation and only minimal assistance to come to standing from the EOB.  Pt reported only minimal R knee pain and her SpO2 and HR were both WNL on room air.  Pt's ambulation distance was limited by onset of nausea with gait with nursing notified.  Pt will benefit from HHPT upon discharge to safely address deficits listed in patient problem list for decreased caregiver assistance and eventual return to PLOF.         Recommendations for follow up therapy are one component of a multi-disciplinary discharge planning process, led by the attending physician.  Recommendations may be updated based on patient status, additional functional criteria and insurance authorization.  Follow Up Recommendations Home health PT    Assistance Recommended at Discharge Frequent or constant Supervision/Assistance  Patient can return home with the following  A little help with walking and/or transfers;A little help with bathing/dressing/bathroom;Assistance with cooking/housework;Direct supervision/assist for financial management;Assist for transportation;Help with stairs or ramp for entrance    Equipment Recommendations Rolling walker (2 wheels);BSC/3in1 (youth RW)  Recommendations for Other Services       Functional Status Assessment Patient has had a recent decline in their functional status and demonstrates the ability to make significant improvements in function in a reasonable and predictable amount of time.     Precautions /  Restrictions Precautions Precautions: Fall Restrictions Weight Bearing Restrictions: Yes RLE Weight Bearing: Weight bearing as tolerated Other Position/Activity Restrictions: Pt able to perform Ind RLE SLR without extensor lag, no KI required      Mobility  Bed Mobility Overal bed mobility: Modified Independent             General bed mobility comments: Extra time and effort only    Transfers Overall transfer level: Needs assistance Equipment used: Rolling walker (2 wheels) Transfers: Sit to/from Stand Sit to Stand: Min assist           General transfer comment: Min verbal and visual cues for sequencing    Ambulation/Gait Ambulation/Gait assistance: Min guard Gait Distance (Feet): 3 Feet Assistive device: Rolling walker (2 wheels) Gait Pattern/deviations: Step-to pattern, Antalgic Gait velocity: decreased     General Gait Details: Pt able to take several steps at the EOB and then from bed to chair with good stability and no buckling with nausea limiting further ambulation  Stairs            Wheelchair Mobility    Modified Rankin (Stroke Patients Only)       Balance Overall balance assessment: Needs assistance   Sitting balance-Leahy Scale: Good     Standing balance support: Bilateral upper extremity supported, During functional activity Standing balance-Leahy Scale: Fair                               Pertinent Vitals/Pain Pain Assessment Pain Assessment: 0-10 Pain Score: 1  Pain Location: R knee Pain Descriptors / Indicators: Sore Pain Intervention(s): Repositioned, Premedicated before session, Ice applied, Monitored during session  Home Living Family/patient expects to be discharged to:: Private residence Living Arrangements: Spouse/significant other Available Help at Discharge: Family;Available 24 hours/day Type of Home: House Home Access: Stairs to enter Entrance Stairs-Rails: Right;Left (Too wide for both) Entrance  Stairs-Number of Steps: 7   Home Layout: Two level;Able to live on main level with bedroom/bathroom Home Equipment: Shower seat - built in Additional Comments: Owns 3WW    Prior Function Prior Level of Function : Independent/Modified Independent             Mobility Comments: Mod Ind amb with a 3WW, 2 falls in the last 6 months both while working in the yard on uneven ground ADLs Comments: Ind with ADLs     Hand Dominance        Extremity/Trunk Assessment   Upper Extremity Assessment Upper Extremity Assessment: Overall WFL for tasks assessed    Lower Extremity Assessment Lower Extremity Assessment: Generalized weakness;RLE deficits/detail RLE Deficits / Details: Pt able to perform Ind SLR without extensor lag RLE Sensation: WNL       Communication   Communication: No difficulties  Cognition Arousal/Alertness: Awake/alert Behavior During Therapy: WFL for tasks assessed/performed Overall Cognitive Status: Within Functional Limits for tasks assessed                                          General Comments      Exercises Total Joint Exercises Quad Sets: Strengthening, Right, 10 reps Straight Leg Raises: AROM, Strengthening, Right, 5 reps Long Arc Quad: AROM, Strengthening, Right, 10 reps, 15 reps Knee Flexion: AROM, Strengthening, Right, 10 reps, 15 reps Goniometric ROM: R knee AROM: 9-62 deg Marching in Standing: Strengthening, Both, 5 reps, Standing Other Exercises Other Exercises: Positioning education to promote R knee ext PROM Other Exercises: HEP per handout   Assessment/Plan    PT Assessment Patient needs continued PT services  PT Problem List Decreased strength;Decreased range of motion;Decreased activity tolerance;Decreased balance;Decreased mobility;Decreased knowledge of use of DME;Pain       PT Treatment Interventions DME instruction;Gait training;Stair training;Functional mobility training;Therapeutic activities;Therapeutic  exercise;Balance training;Patient/family education    PT Goals (Current goals can be found in the Care Plan section)  Acute Rehab PT Goals Patient Stated Goal: To be able to ride my bike PT Goal Formulation: With patient Time For Goal Achievement: 05/11/22 Potential to Achieve Goals: Good    Frequency BID     Co-evaluation               AM-PAC PT "6 Clicks" Mobility  Outcome Measure Help needed turning from your back to your side while in a flat bed without using bedrails?: A Little Help needed moving from lying on your back to sitting on the side of a flat bed without using bedrails?: A Little Help needed moving to and from a bed to a chair (including a wheelchair)?: A Little Help needed standing up from a chair using your arms (e.g., wheelchair or bedside chair)?: A Little Help needed to walk in hospital room?: A Lot Help needed climbing 3-5 steps with a railing? : A Lot 6 Click Score: 16    End of Session Equipment Utilized During Treatment: Gait belt Activity Tolerance: Other (comment) (limited by nausea) Patient left: in chair;with call bell/phone within reach;with chair alarm set;with family/visitor present;with SCD's reapplied;Other (comment) (polar care to R knee) Nurse Communication: Mobility status;Weight bearing status PT Visit Diagnosis:  Muscle weakness (generalized) (M62.81);Other abnormalities of gait and mobility (R26.89);Pain Pain - Right/Left: Right Pain - part of body: Knee    Time: 9842-1031 PT Time Calculation (min) (ACUTE ONLY): 62 min   Charges:   PT Evaluation $PT Eval Moderate Complexity: 1 Mod PT Treatments $Therapeutic Exercise: 8-22 mins $Therapeutic Activity: 8-22 mins       D. Royetta Asal PT, DPT 04/28/22, 5:52 PM

## 2022-04-28 NOTE — Op Note (Signed)
OPERATIVE NOTE  DATE OF SURGERY:  04/28/2022  PATIENT NAME:  Shaka Zech Pion   DOB: 25-Nov-1941  MRN: [id number]  PRE-OPERATIVE DIAGNOSIS: Degenerative arthrosis of the right knee, primary  POST-OPERATIVE DIAGNOSIS:  Same  PROCEDURE:  Right total knee arthroplasty using computer-assisted navigation  SURGEON:  Marciano Sequin. M.D.  ASSISTANT: Cassell Smiles, PA-C (present and scrubbed throughout the case, critical for assistance with exposure, retraction, instrumentation, and closure)  ANESTHESIA: general  ESTIMATED BLOOD LOSS: 50 mL  FLUIDS REPLACED: 800 mL of crystalloid  TOURNIQUET TIME: 79 minutes  DRAINS: 2 medium Hemovac drains  SOFT TISSUE RELEASES: Anterior cruciate ligament, posterior cruciate ligament, deep and superficial medial collateral ligament, patellofemoral ligament  IMPLANTS UTILIZED: DePuy Attune size 4N posterior stabilized femoral component (cemented), size 3 rotating platform tibial component (cemented), 32 mm medialized dome patella (cemented), and a 5 mm stabilized rotating platform polyethylene insert.  INDICATIONS FOR SURGERY: Sandee Bernath Barritt is a 81 y.o. year old female with a long history of progressive knee pain. X-rays demonstrated severe degenerative changes in tricompartmental fashion. The patient had not seen any significant improvement despite conservative nonsurgical intervention. After discussion of the risks and benefits of surgical intervention, the patient expressed understanding of the risks benefits and agree with plans for total knee arthroplasty.   The risks, benefits, and alternatives were discussed at length including but not limited to the risks of infection, bleeding, nerve injury, stiffness, blood clots, the need for revision surgery, cardiopulmonary complications, among others, and they were willing to proceed.  PROCEDURE IN DETAIL: The patient was brought into the operating room and, after adequate general anesthesia was achieved, a tourniquet  was placed on the patient's upper thigh. The patient's knee and leg were cleaned and prepped with alcohol and DuraPrep and draped in the usual sterile fashion. A "timeout" was performed as per usual protocol. The lower extremity was exsanguinated using an Esmarch, and the tourniquet was inflated to 300 mmHg. An anterior longitudinal incision was made followed by a standard mid vastus approach. The deep fibers of the medial collateral ligament were elevated in a subperiosteal fashion off of the medial flare of the tibia so as to maintain a continuous soft tissue sleeve. The patella was subluxed laterally and the patellofemoral ligament was incised. Inspection of the knee demonstrated severe degenerative changes with full-thickness loss of articular cartilage. Osteophytes were debrided using a rongeur. Anterior and posterior cruciate ligaments were excised. Two 4.0 mm Schanz pins were inserted in the femur and into the tibia for attachment of the array of trackers used for computer-assisted navigation. Hip center was identified using a circumduction technique. Distal landmarks were mapped using the computer. The distal femur and proximal tibia were mapped using the computer. The distal femoral cutting guide was positioned using computer-assisted navigation so as to achieve a 5 distal valgus cut. The femur was sized and it was felt that a size 4N femoral component was appropriate. A size 4 femoral cutting guide was positioned and the anterior cut was performed and verified using the computer. This was followed by completion of the posterior and chamfer cuts. Femoral cutting guide for the central box was then positioned in the center box cut was performed.  Attention was then directed to the proximal tibia. Medial and lateral menisci were excised. The extramedullary tibial cutting guide was positioned using computer-assisted navigation so as to achieve a 0 varus-valgus alignment and 3 posterior slope. The cut was  performed and verified using the computer. The proximal  tibia was sized and it was felt that a size 3 tibial tray was appropriate. Tibial and femoral trials were inserted followed by insertion of a 5 mm polyethylene insert. The knee was felt to be tight medially. A Cobb elevator was used to elevate the superficial fibers of the medial collateral ligament.  This allowed for excellent mediolateral soft tissue balancing both in flexion and in full extension. Finally, the patella was cut and prepared so as to accommodate a 32 mm medialized dome patella. A patella trial was placed and the knee was placed through a range of motion with excellent patellar tracking appreciated. The femoral trial was removed after debridement of posterior osteophytes. The central post-hole for the tibial component was reamed followed by insertion of a keel punch. Tibial trials were then removed. Cut surfaces of bone were irrigated with copious amounts of normal saline using pulsatile lavage and then suctioned dry. Polymethylmethacrylate cement with gentamicin was prepared in the usual fashion using a vacuum mixer. Cement was applied to the cut surface of the proximal tibia as well as along the undersurface of a size 3 rotating platform tibial component. Tibial component was positioned and impacted into place. Excess cement was removed using [employer] fast streamer. Cement was then applied to the cut surfaces of the femur as well as along the posterior flanges of the size 4N femoral component. The femoral component was positioned and impacted into place. Excess cement was removed using [employer] fast streamer. A 5 mm polyethylene trial was inserted and the knee was brought into full extension with steady axial compression applied. Finally, cement was applied to the backside of a 32 mm medialized dome patella and the patellar component was positioned and patellar clamp applied. Excess cement was removed using [employer] fast streamer. After adequate curing of the  cement, the tourniquet was deflated after a total tourniquet time of 79 minutes. Hemostasis was achieved using electrocautery. The knee was irrigated with copious amounts of normal saline using pulsatile lavage followed by 450 ml of Surgiphor and then suctioned dry. 20 mL of 1.3% Exparel and 60 mL of 0.25% Marcaine in 40 mL of normal saline was injected along the posterior capsule, medial and lateral gutters, and along the arthrotomy site. A 5 mm stabilized rotating platform polyethylene insert was inserted and the knee was placed through a range of motion with excellent mediolateral soft tissue balancing appreciated and excellent patellar tracking noted. 2 medium drains were placed in the wound bed and brought out through separate stab incisions. The medial parapatellar portion of the incision was reapproximated using interrupted sutures of #1 Vicryl. Subcutaneous tissue was approximated in layers using first #0 Vicryl followed #2-0 Vicryl. The skin was approximated with skin staples. A sterile dressing was applied.  The patient tolerated the procedure well and was transported to the recovery room in stable condition.    Karyl Sharrar P. Holley Bouche., M.D.

## 2022-04-28 NOTE — Anesthesia Procedure Notes (Signed)
Procedure Name: Intubation Date/Time: 04/28/2022 7:40 AM Performed by: Johnna Acosta, CRNA Pre-anesthesia Checklist: Patient identified, Emergency Drugs available, Suction available, Patient being monitored and Timeout performed Patient Re-evaluated:Patient Re-evaluated prior to induction Oxygen Delivery Method: Circle system utilized Preoxygenation: Pre-oxygenation with 100% oxygen Induction Type: IV induction Ventilation: Mask ventilation without difficulty Laryngoscope Size: McGraph and 3 Grade View: Grade II Tube type: Oral Tube size: 6.5 mm Number of attempts: 1 Airway Equipment and Method: Stylet and Video-laryngoscopy Placement Confirmation: ETT inserted through vocal cords under direct vision and positive ETCO2 Secured at: 18 cm Tube secured with: Tape Dental Injury: Teeth and Oropharynx as per pre-operative assessment  Difficulty Due To: Difficulty was anticipated, Difficult Airway- due to anterior larynx, Difficult Airway- due to limited oral opening and Difficult Airway- due to reduced neck mobility

## 2022-04-28 NOTE — Transfer of Care (Signed)
Immediate Anesthesia Transfer of Care Note  Patient: Cedric C Schwenke  Procedure(s) Performed: COMPUTER ASSISTED TOTAL KNEE ARTHROPLASTY (Right: Knee)  Patient Location: PACU  Anesthesia Type:General  Level of Consciousness: sedated  Airway & Oxygen Therapy: Patient Spontanous Breathing and Patient connected to face mask oxygen  Post-op Assessment: Report given to RN and Post -op Vital signs reviewed and stable  Post vital signs: Reviewed and stable  Last Vitals:  Vitals Value Taken Time  BP 144/75 04/28/22 1052  Temp 36.4 C 04/28/22 1050  Pulse 71 04/28/22 1052  Resp 9 04/28/22 1052  SpO2 97 % 04/28/22 1052    Last Pain:  Vitals:   04/28/22 0710  TempSrc: Temporal  PainSc: 3          Complications: No notable events documented.

## 2022-04-28 NOTE — TOC Progression Note (Signed)
Transition of Care [facility]) - Progression Note    Patient Details  Name: [name] MRN: [id number] Date of Birth: Sep 23, 1941  Transition [facility]) CM/SW [place], RN Phone Number: 04/28/2022, 12:32 PM  Clinical Narrative:     The patient is set up with [place] for [facility] services prior to surgery by surgeons office       Expected Discharge Plan and Services                                                 Social Determinants of Health (SDOH) Interventions    Readmission Risk Interventions     View : No data to display.

## 2022-04-28 NOTE — Anesthesia Postprocedure Evaluation (Signed)
Anesthesia Post Note  Patient: [name]  Procedure(s) Performed: COMPUTER ASSISTED TOTAL KNEE ARTHROPLASTY (Right: Knee)  Patient location during evaluation: PACU Anesthesia Type: General Level of consciousness: awake and alert Pain management: pain level controlled Vital Signs Assessment: post-procedure vital signs reviewed and stable Respiratory status: spontaneous breathing, nonlabored ventilation, respiratory function stable and patient connected to nasal cannula oxygen Cardiovascular status: blood pressure returned to baseline and stable Postop Assessment: no apparent nausea or vomiting Anesthetic complications: no   No notable events documented.   Last Vitals:  Vitals:   04/28/22 1145 04/28/22 1157  BP:  (!) 149/69  Pulse:  84  Resp: 14 14  Temp: 36.7 C   SpO2:  99%    Last Pain:  Vitals:   04/28/22 1235  TempSrc:   PainSc: 0-No pain                 Precious Haws Jonnatan Hanners

## 2022-04-28 NOTE — H&P (Signed)
The patient has been re-examined, and the chart reviewed, and there have been no interval changes to the documented history and physical.    The risks, benefits, and alternatives have been discussed at length. The patient expressed understanding of the risks benefits and agreed with plans for surgical intervention.  Noeli Lavery P. Maryclaire Stoecker, Jr. M.D.    

## 2022-04-28 NOTE — Progress Notes (Signed)
Patient awake to voice: name,"thrashing" in bed trying to turn on right side, per Dr. Marry Guan ok for knee immobilizer to maintain alignment. Sbp elevated, per anesthesia medicated as ordered, will continue to monitor.

## 2022-04-29 DIAGNOSIS — I10 Essential (primary) hypertension: Secondary | ICD-10-CM | POA: Diagnosis not present

## 2022-04-29 DIAGNOSIS — Z7982 Long term (current) use of aspirin: Secondary | ICD-10-CM | POA: Diagnosis not present

## 2022-04-29 DIAGNOSIS — Z8673 Personal history of transient ischemic attack (TIA), and cerebral infarction without residual deficits: Secondary | ICD-10-CM | POA: Diagnosis not present

## 2022-04-29 DIAGNOSIS — Z91040 Latex allergy status: Secondary | ICD-10-CM | POA: Diagnosis not present

## 2022-04-29 DIAGNOSIS — E039 Hypothyroidism, unspecified: Secondary | ICD-10-CM | POA: Diagnosis not present

## 2022-04-29 DIAGNOSIS — Z79899 Other long term (current) drug therapy: Secondary | ICD-10-CM | POA: Diagnosis not present

## 2022-04-29 DIAGNOSIS — M1711 Unilateral primary osteoarthritis, right knee: Secondary | ICD-10-CM | POA: Diagnosis not present

## 2022-04-29 DIAGNOSIS — J45909 Unspecified asthma, uncomplicated: Secondary | ICD-10-CM | POA: Diagnosis not present

## 2022-04-29 MED ORDER — ENOXAPARIN SODIUM 40 MG/0.4ML IJ SOSY: 40.0000 mg | PREFILLED_SYRINGE | INTRAMUSCULAR | 0 refills | Status: DC

## 2022-04-29 MED ORDER — TRAMADOL HCL 50 MG PO TABS: 50.0000 mg | ORAL_TABLET | ORAL | 0 refills | Status: DC | PRN

## 2022-04-29 MED ORDER — OXYCODONE HCL 5 MG PO TABS: 5.0000 mg | ORAL_TABLET | ORAL | 0 refills | Status: DC | PRN

## 2022-04-29 MED ORDER — HYDRALAZINE HCL 10 MG PO TABS: 10.0000 mg | ORAL_TABLET | Freq: Four times a day (QID) | ORAL | Status: DC | PRN

## 2022-04-29 MED ORDER — CELECOXIB 200 MG PO CAPS
200.0000 mg | ORAL_CAPSULE | Freq: Two times a day (BID) | ORAL | 0 refills | Status: DC
Start: 2022-04-29 — End: 2022-08-22

## 2022-04-29 NOTE — Plan of Care (Signed)

## 2022-04-29 NOTE — Progress Notes (Signed)
Physical Therapy Treatment Patient Details Name: [name] MRN: [id number] DOB: 03/25/1941 Today's Date: 04/29/2022   History of Present Illness Pt is a 81 yo F diagnosed with degenerative arthrosis of the right knee and is s/p elective R TKA.  PMH includes: anxiety, HTN, hypokalemia, angina, and CVA.    PT Comments    Pt was pleasant and motivated to participate during the session and put forth good effort throughout. Pt required no physical assistance during the session and presented with good control and stability with transfers, gait, and stair training.  Pt and spouse present for stair training with pt able to demonstrate good carryover of proper sequencing.  Pt reported no adverse symptoms during the session other than mild R knee pain with SpO2 and HR WNL on room air. Pt will benefit from HHPT upon discharge to safely address deficits listed in patient problem list for decreased caregiver assistance and eventual return to PLOF.     Recommendations for follow up therapy are one component of a multi-disciplinary discharge planning process, led by the attending physician.  Recommendations may be updated based on patient status, additional functional criteria and insurance authorization.  Follow Up Recommendations  Home health PT     Assistance Recommended at Discharge Intermittent Supervision/Assistance  Patient can return home with the following A little help with walking and/or transfers;A little help with bathing/dressing/bathroom;Assistance with cooking/housework;Direct supervision/assist for financial management;Assist for transportation;Help with stairs or ramp for entrance   Equipment Recommendations  Rolling walker (2 wheels);BSC/3in1    Recommendations for Other Services       Precautions / Restrictions Precautions Precautions: Fall;Knee Restrictions Weight Bearing Restrictions: Yes RLE Weight Bearing: Weight bearing as tolerated Other Position/Activity Restrictions:  Pt able to perform Ind RLE SLR without extensor lag, no KI required     Mobility  Bed Mobility               General bed mobility comments: NT, pt in recliner    Transfers Overall transfer level: Needs assistance Equipment used: Rolling walker (2 wheels) Transfers: Sit to/from Stand Sit to Stand: Supervision           General transfer comment: Min verbal cues for hand placement    Ambulation/Gait Ambulation/Gait assistance: Supervision Gait Distance (Feet): 125 Feet Assistive device: Rolling walker (2 wheels) Gait Pattern/deviations: Step-to pattern, Step-through pattern, Decreased step length - right, Decreased step length - left, Antalgic Gait velocity: decreased     General Gait Details: Mildly antalgic gait pattern that progressed from step-to to step-through as teh session progressed; steady without LOB or buckling   Stairs Stairs: Yes Stairs assistance: Min guard Stair Management: One rail Right, Two rails, Step to pattern, Forwards Number of Stairs: 4 General stair comments: Pt and spouse education provided verbally and visually followed by practice for ascend/descending with B rails and then with one rail secondary to pt's rails potentially being too wide to use together; education provided with sequencing for ascending one step backwards/descending forwards with a RW for use with stool next to pt's bed   Wheelchair Mobility    Modified Rankin (Stroke Patients Only)       Balance Overall balance assessment: Needs assistance Sitting-balance support: Feet supported Sitting balance-Leahy Scale: Good     Standing balance support: During functional activity, Bilateral upper extremity supported Standing balance-Leahy Scale: Good  Cognition Arousal/Alertness: Awake/alert Behavior During Therapy: WFL for tasks assessed/performed Overall Cognitive Status: Within Functional Limits for tasks assessed                                           Exercises Total Joint Exercises Quad Sets: Strengthening, Right, 10 reps, 5 reps, AROM Long Arc Quad: AROM, Strengthening, Right, 10 reps, 15 reps Knee Flexion: AROM, Strengthening, Right, 10 reps, 15 reps Goniometric ROM: R knee AROM: 8-76 deg Other Exercises Other Exercises: Positioning review to promote R knee ext PROM Other Exercises: HEP education/review per handout Other Exercises: Car transfer sequencing education provided    General Comments        Pertinent Vitals/Pain Pain Assessment Pain Assessment: 0-10 Pain Score: 1  Pain Location: R knee Pain Descriptors / Indicators: Sore Pain Intervention(s): Repositioned, Premedicated before session, Ice applied, Monitored during session    Home Living Family/patient expects to be discharged to:: Private residence Living Arrangements: Spouse/significant other Available Help at Discharge: Family;Available 24 hours/day Type of Home: House Home Access: Stairs to enter Entrance Stairs-Rails: Psychiatric nurse of Steps: 7   Home Layout: Two level;Able to live on main level with bedroom/bathroom Home Equipment: Shower seat - built in;Toilet riser      Prior Function            PT Goals (current goals can now be found in the care plan section) Progress towards PT goals: Progressing toward goals    Frequency    BID      PT Plan Current plan remains appropriate    Co-evaluation              AM-PAC PT "6 Clicks" Mobility   Outcome Measure  Help needed turning from your back to your side while in a flat bed without using bedrails?: A Little Help needed moving from lying on your back to sitting on the side of a flat bed without using bedrails?: A Little Help needed moving to and from a bed to a chair (including a wheelchair)?: A Little Help needed standing up from a chair using your arms (e.g., wheelchair or bedside chair)?: A Little Help needed to  walk in hospital room?: A Little Help needed climbing 3-5 steps with a railing? : A Little 6 Click Score: 18    End of Session Equipment Utilized During Treatment: Gait belt Activity Tolerance: Patient tolerated treatment well Patient left: in chair;with call bell/phone within reach;with chair alarm set;with family/visitor present;with SCD's reapplied;Other (comment) (Polar care to R knee) Nurse Communication: Mobility status;Weight bearing status PT Visit Diagnosis: Muscle weakness (generalized) (M62.81);Other abnormalities of gait and mobility (R26.89);Pain Pain - Right/Left: Right Pain - part of body: Knee     Time: 1610-9604 PT Time Calculation (min) (ACUTE ONLY): 38 min  Charges:  $Gait Training: 8-22 mins $Therapeutic Exercise: 8-22 mins $Therapeutic Activity: 8-22 mins                    D. Scott Kalinda Romaniello PT, DPT 04/29/22, 12:18 PM

## 2022-04-29 NOTE — Progress Notes (Signed)
Post-op dressing removed. , Hemovac removed., Mini compression dressing applied. , and Fresh honeycomb dressing applied.   

## 2022-04-29 NOTE — Discharge Summary (Signed)
Physician Discharge Summary  Patient ID: [name] MRN: [id number] DOB/AGE: [birth year] 81 y.o.  Admit date: 04/28/2022 Discharge date: 04/29/2022  Admission Diagnoses:  Total knee replacement status [Z96.659]  Surgeries:Procedure(s): Right total knee arthroplasty using computer-assisted navigation   SURGEON:  Marciano Sequin. M.D.   ASSISTANT: Cassell Smiles, PA-C (present and scrubbed throughout the case, critical for assistance with exposure, retraction, instrumentation, and closure)   ANESTHESIA: general   ESTIMATED BLOOD LOSS: 50 mL   FLUIDS REPLACED: 800 mL of crystalloid   TOURNIQUET TIME: 79 minutes   DRAINS: 2 medium Hemovac drains   SOFT TISSUE RELEASES: Anterior cruciate ligament, posterior cruciate ligament, deep and superficial medial collateral ligament, patellofemoral ligament   IMPLANTS UTILIZED: DePuy Attune size 4N posterior stabilized femoral component (cemented), size 3 rotating platform tibial component (cemented), 32 mm medialized dome patella (cemented), and a 5 mm stabilized rotating platform polyethylene insert.  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   Primary osteoarthritis of left knee 02/24/2022   Primary osteoarthritis of right knee 02/24/2022   Lumbar spondylolysis 04/12/2020   History of CVA (cerebrovascular accident) 03/26/2020   Low back pain radiating to right lower extremity 03/21/2020   B12 deficiency 03/06/2020   Positive anti-CCP test 12/21/2019   Arthralgia 12/13/2019   Dermatitis 12/13/2019   Rheumatoid factor positive 12/13/2019   Chest pain with moderate risk for cardiac etiology 12/11/2018   Essential hypertension 12/11/2018   Palpitations 12/11/2018   Acquired hypothyroidism 11/10/2018   Arthritis of knee 09/17/2016   Anxiety 11/22/2014   Asthma without status asthmaticus 11/22/2014   Benign neoplasm of colon,  unspecified 11/22/2014   Environmental allergies 11/22/2014   Hypertriglyceridemia 11/22/2014   Hypokalemia 11/22/2014   Personal history of disease of skin and subcutaneous tissue 11/22/2014    Past Medical History:  Diagnosis Date   Anemia    Arthritis    Asthma    uses inhaler just prior to surgery to avoid attack   Back pain    from previous injury   Complication of anesthesia    has woken  up during 2 different surgery   Depression    no current issue/treatment; situation   Gallstones    GERD (gastroesophageal reflux disease)    Hiatal hernia    patient does NOT have nerve/muscle disease   History of kidney stones    HLD (hyperlipidemia)    HTN (hypertension)    Hypothyroidism    Kidney stones    Knee pain    Non-diabetic pancreatic hormone dysfunction years   pt. 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   Pancreatitis    Pneumonia    Seizures (HCC)    caused by dye injected during a procedure   Shortness of breath    with exertion   Sinus problem    frequent infections/congestion   Stroke ([place]) 2021   reports having CVA in 2021 and having mini strokes before that   Thyroid disease      Transfusion:    Consultants (if any):   Discharged Condition: Improved  Hospital Course: [name] is an 81 y.o. female who was admitted 04/28/2022 with a diagnosis of right knee osteoarthritis and went to the operating room on 04/28/2022 and underwent right total knee arthroplasty. The patient received perioperative antibiotics for prophylaxis (see below). The patient tolerated the procedure well and was transported to PACU in stable condition.  After meeting PACU criteria, the patient was subsequently transferred to the Orthopaedics/Rehabilitation unit.   The patient received DVT prophylaxis in the form of early mobilization, Lovenox, TED hose, and SCDs . A sacral pad had been placed and heels were elevated off of the bed with rolled towels in order to protect skin integrity.  Foley catheter was discontinued on postoperative day #0. Wound drains were discontinued on postoperative day #1. The surgical incision was healing well without signs of infection.  Physical therapy was initiated postoperatively for transfers, gait training, and strengthening. Occupational therapy was initiated for activities of daily living and evaluation for assisted devices. Rehabilitation goals were reviewed in detail with the patient. The patient made steady progress with physical therapy and physical therapy recommended discharge to Home.   The patient achieved the preliminary goals of this hospitalization and was felt to be medically and orthopaedically appropriate for discharge.  She was given perioperative antibiotics:  Anti-infectives (From admission, onward)    Start     Dose/Rate Route Frequency Ordered Stop   04/28/22 1400  hydroxychloroquine (PLAQUENIL) tablet 200 mg        200 mg Oral Daily 04/28/22 1218     04/28/22 1400  ceFAZolin (ANCEF) IVPB 2g/100 mL premix        2 g 200 mL/hr over 30 Minutes Intravenous Every 8 hours 04/28/22 1218 04/29/22 0708   04/28/22 0631  ceFAZolin (ANCEF) 2-4 GM/100ML-% IVPB       Note to Pharmacy: Trudie Reed S: cabinet override      04/28/22 0631 04/28/22 0758   04/28/22 0600  ceFAZolin (ANCEF) IVPB 2g/100 mL premix        2 g 200 mL/hr over 30 Minutes Intravenous On call to O.R. 04/28/22 4967 04/28/22 5916     .  Recent vital signs:  Vitals:   04/28/22 2022 04/29/22 0342  BP: 138/65 (!) 158/66  Pulse: 77 69  Resp: 16 17  Temp: 97.7 F (36.5 C) 97.8 F (36.6 C)  SpO2: 95% 95%    Recent laboratory studies:  No results for input(s): WBC, HGB, HCT, PLT, K, CL, CO2, BUN, CREATININE, GLUCOSE, CALCIUM, LABPT, INR in the last 72 hours.  Diagnostic Studies: DG Knee Right Port  Result Date: 04/28/2022 CLINICAL DATA:  Status post total right knee arthroplasty. EXAM: PORTABLE RIGHT KNEE - 1-2 VIEW COMPARISON:  None Available. FINDINGS:  Status post total right knee arthroplasty. No perihardware lucency is seen to indicate hardware failure or loosening. A surgical drain is seen within the anterior superior knee. Anterior surgical skin staples. Mild chronic spurring at the quadriceps greater than patellar tendon insertions on the patella as well as the distal patellar tendon insertion on the superior tibial tubercle. Old remote screw tracts within the proximal anterior tibia. No acute fracture or dislocation. IMPRESSION: Status post total right knee arthroplasty without evidence of hardware failure. Electronically Signed   By: Yvonne Kendall M.D.   On: 04/28/2022 11:34    Discharge Medications:   Allergies as of 04/29/2022       Reactions   Cinobac [cinoxacin] Anaphylaxis   "My throat swelled and I stopped breathing."   Contrast Media [iodinated Contrast Media] Other (See Comments)   "Seizure"   Latex Dermatitis   IgE = 14 (WNL) on 04/17/2022 Pt is highly reactive to any latex products. Blisters skin. "Takes my skin off"   Other Shortness Of Breath   Muscle relaxers? Pt thinks allergic to muscle relaxers, reports stops breathing but not sure  what medicine and if for sure muscle relaxer   Petrolatum Distillates [petroleum Distillate] Shortness Of Breath   Passes out    Sulfa Antibiotics Itching        Medication List     STOP taking these medications    aspirin EC 325 MG tablet       TAKE these medications    Acidophilus Lactobacillus Caps Take 1 capsule by mouth daily.   albuterol 108 (90 Base) MCG/ACT inhaler Commonly known as: VENTOLIN HFA Inhale 2 puffs into the lungs every 4 (four) hours as needed for wheezing or shortness of breath.   celecoxib 200 MG capsule Commonly known as: CELEBREX Take 1 capsule (200 mg total) by mouth 2 (two) times daily.   cholecalciferol 25 MCG (1000 UNIT) tablet Commonly known as: VITAMIN D3 Take 1,000 Units by mouth daily.   enoxaparin 40 MG/0.4ML injection Commonly known  as: LOVENOX Inject 0.4 mLs (40 mg total) into the skin daily for 14 days.   estrogens (conjugated) 1.25 MG tablet Commonly known as: PREMARIN Take 1.25 mg by mouth daily.   ferrous sulfate 325 (65 FE) MG tablet Take 325 mg by mouth daily.   folic acid 938 MCG tablet Commonly known as: FOLVITE Take 800 mcg by mouth daily.   gabapentin 300 MG capsule Commonly known as: NEURONTIN Take 300 mg by mouth 3 (three) times daily.   hydroxychloroquine 200 MG tablet Commonly known as: PLAQUENIL Take 200 mg by mouth daily.   levothyroxine 50 MCG tablet Commonly known as: SYNTHROID Take 50 mcg by mouth daily before breakfast.   losartan 50 MG tablet Commonly known as: COZAAR Take 50 mg by mouth daily.   montelukast 10 MG tablet Commonly known as: SINGULAIR Take 10 mg by mouth daily.   Multivitamin Gummies Adult Chew Chew 2 capsules by mouth daily.   oxyCODONE 5 MG immediate release tablet Commonly known as: Oxy IR/ROXICODONE Take 1 tablet (5 mg total) by mouth every 4 (four) hours as needed for severe pain.   pantoprazole 40 MG tablet Commonly known as: PROTONIX Take 40 mg by mouth See admin instructions. Take 40 mg daily, may take a second 40 mg dose as needed for acid reflux   predniSONE 10 MG tablet Commonly known as: DELTASONE Take 10 mg by mouth daily.   propranolol ER 60 MG 24 hr capsule Commonly known as: INDERAL LA Take 60 mg by mouth daily.   sertraline 50 MG tablet Commonly known as: ZOLOFT Take 50 mg by mouth daily.   SYSTANE OP Place 1 drop into both eyes 2 (two) times daily as needed (dry eyes).   traMADol 50 MG tablet Commonly known as: ULTRAM Take 1 tablet (50 mg total) by mouth every 4 (four) hours as needed for moderate pain.   vitamin B-12 1000 MCG tablet Commonly known as: CYANOCOBALAMIN Take 1,000 mcg by mouth daily.   XLEAR SINUS CARE SPRAY NA Place 1 spray into the nose daily as needed (congestion).               Durable Medical  Equipment  (From admission, onward)           Start     Ordered   04/29/22 0941  DME Walker rolling  Once       Comments: youth  Question:  Patient needs a walker to treat with the following condition  Answer:  Total knee replacement status   04/29/22 0940   04/28/22 1219  DME Bedside commode  Once  Question:  Patient needs a bedside commode to treat with the following condition  Answer:  Total knee replacement status   04/28/22 1218            Disposition: Home with home health PT     Follow-up Information     Fausto Skillern, PA-C Follow up on 05/13/2022.   Specialty: Orthopedic Surgery Why: at 1:15pm Contact information: [place] [id number] [phone]         Dereck Leep, MD Follow up on 06/17/2022.   Specialty: Orthopedic Surgery Why: at 10:15am Contact information: [address] [place] [address] [place], PA-C 04/29/2022, 2:11 PM

## 2022-04-29 NOTE — Plan of Care (Signed)
  Problem: Education: Goal: Knowledge of the prescribed therapeutic regimen will improve 04/29/2022 1416 by Alferd Apa, RN Outcome: Adequate for Discharge 04/29/2022 1149 by Alferd Apa, RN Outcome: Progressing Goal: Individualized Educational Video(s) 04/29/2022 1416 by Alferd Apa, RN Outcome: Adequate for Discharge 04/29/2022 1149 by Alferd Apa, RN Outcome: Progressing   Problem: Activity: Goal: Ability to avoid complications of mobility impairment will improve 04/29/2022 1416 by Alferd Apa, RN Outcome: Adequate for Discharge 04/29/2022 1149 by Alferd Apa, RN Outcome: Progressing Goal: Range of joint motion will improve 04/29/2022 1416 by Alferd Apa, RN Outcome: Adequate for Discharge 04/29/2022 1149 by Alferd Apa, RN Outcome: Progressing   Problem: Clinical Measurements: Goal: Postoperative complications will be avoided or minimized 04/29/2022 1416 by Alferd Apa, RN Outcome: Adequate for Discharge 04/29/2022 1149 by Alferd Apa, RN Outcome: Progressing   Problem: Pain Management: Goal: Pain level will decrease with appropriate interventions 04/29/2022 1416 by Alferd Apa, RN Outcome: Adequate for Discharge 04/29/2022 1149 by Alferd Apa, RN Outcome: Progressing   Problem: Skin Integrity: Goal: Will show signs of wound healing 04/29/2022 1416 by Alferd Apa, RN Outcome: Adequate for Discharge 04/29/2022 1149 by Alferd Apa, RN Outcome: Progressing

## 2022-04-29 NOTE — Progress Notes (Cosign Needed)
Patient is not able to walk the distance required to go the bathroom, or he/she is unable to safely negotiate stairs required to access the bathroom.  A 3in1 BSC will alleviate this problem  

## 2022-04-29 NOTE — Progress Notes (Signed)
  Subjective: 1 Day Post-Op Procedure(s) (LRB): COMPUTER ASSISTED TOTAL KNEE ARTHROPLASTY (Right) Patient reports pain as well-controlled.  Husband at bedside Patient is well, and has had no acute complaints or problems Plan is to go Home after hospital stay. Negative for chest pain and shortness of breath Fever: no Gastrointestinal: negative for nausea and vomiting.  Patient has not had a bowel movement.  Objective: Vital signs in last 24 hours: Temp:  [97.5 F (36.4 C)-98.3 F (36.8 C)] 97.8 F (36.6 C) (05/23 0342) Pulse Rate:  [68-84] 69 (05/23 0342) Resp:  [9-17] 17 (05/23 0342) BP: (138-192)/(63-94) 158/66 (05/23 0342) SpO2:  [95 %-99 %] 95 % (05/23 0342)  Intake/Output from previous day:  Intake/Output Summary (Last 24 hours) at 04/29/2022 1018 Last data filed at 04/29/2022 0600 Gross per 24 hour  Intake 1440 ml  Output 510 ml  Net 930 ml    Intake/Output this shift: No intake/output data recorded.  Labs: No results for input(s): HGB in the last 72 hours. No results for input(s): WBC, RBC, HCT, PLT in the last 72 hours. No results for input(s): NA, K, CL, CO2, BUN, CREATININE, GLUCOSE, CALCIUM in the last 72 hours. No results for input(s): LABPT, INR in the last 72 hours.   EXAM General - Patient is Alert, Appropriate, and Oriented Extremity - Neurovascular intact Dorsiflexion/Plantar flexion intact Compartment soft Dressing/Incision -Postoperative dressing remains in place., Polar Care in place and working. , Hemovac in place.  Motor Function - intact, moving foot and toes well on exam. Able to perform independent SLR.  Cardiovascular- Regular rate and rhythm, no murmurs/rubs/gallops Respiratory- Lungs clear to auscultation bilaterally Gastrointestinal- soft, nontender, and active bowel sounds   Assessment/Plan: 1 Day Post-Op Procedure(s) (LRB): COMPUTER ASSISTED TOTAL KNEE ARTHROPLASTY (Right) Principal Problem:   Total knee replacement  status  Estimated body mass index is 23.2 kg/m as calculated from the following:   Height as of 04/17/22: 4' 11.5" (1.511 m).   Weight as of 04/17/22: 53 kg. Advance diet Up with therapy  Likely d/c today pending completion of PT goals     DVT Prophylaxis - Lovenox, Ted hose, and SCDs Weight-Bearing as tolerated to right leg  Cassell Smiles, PA-C [facility] Orthopaedic Surgery 04/29/2022, 10:18 AM

## 2022-04-29 NOTE — Progress Notes (Signed)
Occupational Therapy Evaluation Patient Details Name: [name] MRN: [id number] DOB: [birth year] Today's Date: 04/29/2022   History of Present Illness Pt is a 81 yo F diagnosed with degenerative arthrosis of the right knee and is s/p elective R TKA.  PMH includes: anxiety, HTN, hypokalemia, angina, and CVA.   Clinical Impression   Pt was seen for OT evaluation this date. Prior to hospital admission, pt was MOD I + 3WW for mobility, independent in ADLs, and spouse assisted in transportation. Pt lives with spouse in a two story home, but reports able to live on main floor with bedroom/bathroom. Pt currently requires SUPERVISION for hand placement/safety during toilet t/f and grooming tasks - combing hair, washing hands and brushing teeth; MIN A for upper body dressing to thread/unthread IV line and RUE; and MIN A for lower body dressing- needs assistance to don/doff socks. Pt educated on polar care and provided handout. All education complete, will sign off. Upon hospital discharge, recommend no OT follow up.    Recommendations for follow up therapy are one component of a multi-disciplinary discharge planning process, led by the attending physician.  Recommendations may be updated based on patient status, additional functional criteria and insurance authorization.   Follow Up Recommendations  No OT follow up    Assistance Recommended at Discharge Intermittent Supervision/Assistance  Patient can return home with the following Assistance with cooking/housework;Assist for transportation;A little help with bathing/dressing/bathroom    Functional Status Assessment  Patient has had a recent decline in their functional status and demonstrates the ability to make significant improvements in function in a reasonable and predictable amount of time.  Equipment Recommendations  None recommended by OT    Recommendations for Other Services       Precautions / Restrictions Precautions Precautions:  Fall;Knee Restrictions Weight Bearing Restrictions: Yes RLE Weight Bearing: Weight bearing as tolerated Other Position/Activity Restrictions: Pt able to perform Ind RLE SLR without extensor lag, no KI required      Mobility Bed Mobility Overal bed mobility: Modified Independent                  Transfers Overall transfer level: Needs assistance Equipment used: Rolling walker (2 wheels) Transfers: Sit to/from Stand, Bed to chair/wheelchair/BSC Sit to Stand: Min guard     Step pivot transfers: Supervision     General transfer comment: Pt requires verbal cues for hand placement      Balance Overall balance assessment: Needs assistance Sitting-balance support: Feet supported Sitting balance-Leahy Scale: Good     Standing balance support: During functional activity, No upper extremity supported Standing balance-Leahy Scale: Good                             ADL either performed or assessed with clinical judgement   ADL Overall ADL's : Needs assistance/impaired                                       General ADL Comments: SUPERVISION for hand placement/safety during toilet t/f and grooming tasks - combing hair, washing hands and brushing teeth. MIN A for upper body dressing to thread/unthread IV line and RUE. MIN A for lower body dressing- needs assistance to don/doff socks.      Pertinent Vitals/Pain Pain Assessment Pain Assessment: No/denies pain        Extremity/Trunk Assessment Upper Extremity Assessment Upper  Extremity Assessment: Overall WFL for tasks assessed (Tremors in both hands while brushing, pt reports this is baseline)   Lower Extremity Assessment Lower Extremity Assessment: Generalized weakness          Cognition Arousal/Alertness: Awake/alert Behavior During Therapy: WFL for tasks assessed/performed Overall Cognitive Status: Within Functional Limits for tasks assessed                                                   Home Living Family/patient expects to be discharged to:: Private residence Living Arrangements: Spouse/significant other Available Help at Discharge: Family;Available 24 hours/day Type of Home: House Home Access: Stairs to enter [employer] of Steps: 7 Entrance Stairs-Rails: Right;Left Home Layout: Two level;Able to live on main level with bedroom/bathroom     Bathroom Shower/Tub: Occupational psychologist: Standard     Home Equipment: Shower seat - built in;Toilet riser          Prior Functioning/Environment Prior Level of Function : Independent/Modified Independent             Mobility Comments: Mod Ind amb with a 3WW, 2 falls in the last 6 months ADLs Comments: Ind with ADLs        OT Problem List: Decreased safety awareness      OT Treatment/Interventions:      OT Goals(Current goals can be found in the care plan section) Acute Rehab OT Goals Patient Stated Goal: To return home OT Goal Formulation: With patient Time For Goal Achievement: 05/13/22 Potential to Achieve Goals: Good  OT Frequency:         AM-PAC OT "6 Clicks" Daily Activity     Outcome Measure Help from another person eating meals?: None Help from another person taking care of personal grooming?: None Help from another person toileting, which includes using toliet, bedpan, or urinal?: A Little Help from another person bathing (including washing, rinsing, drying)?: A Little Help from another person to put on and taking off regular upper body clothing?: None Help from another person to put on and taking off regular lower body clothing?: A Little 6 Click Score: 21   End of Session Equipment Utilized During Treatment: Rolling walker (2 wheels)  Activity Tolerance: Patient tolerated treatment well Patient left: in chair;with call bell/phone within reach;with family/visitor present;with SCD's reapplied (Polar care reapplied)  OT Visit Diagnosis:  Unsteadiness on feet (R26.81)                Time: 9179-1505 OT Time Calculation (min): 30 min Charges:  OT General Charges $OT Visit: 1 Visit OT Evaluation $OT Eval Low Complexity: 1 Low OT Treatments $Self Care/Home Management : 8-22 mins  [employer] OTDS  [employer] 04/29/2022, 12:14 PM

## 2022-04-29 NOTE — Progress Notes (Signed)
Met with the patient in the room at the bedside The patient lives at Home with her husband The patient  currently has a shower seat The patient will need a youth rolling walker and a 3 in1 to be delivered by [employer] to the bedside They have transportation with her husband They can afford their medication  They are set up with [facility] for Home health services   Admitted for: Knee replacement surgery

## 2022-05-13 DIAGNOSIS — M25661 Stiffness of right knee, not elsewhere classified: Secondary | ICD-10-CM | POA: Diagnosis not present

## 2022-05-13 DIAGNOSIS — R531 Weakness: Secondary | ICD-10-CM | POA: Diagnosis not present

## 2022-05-13 DIAGNOSIS — Z96651 Presence of right artificial knee joint: Secondary | ICD-10-CM | POA: Diagnosis not present

## 2022-05-13 DIAGNOSIS — M25561 Pain in right knee: Secondary | ICD-10-CM | POA: Diagnosis not present

## 2022-05-15 ENCOUNTER — Emergency Department
Admission: EM | Admit: 2022-05-15 | Discharge: 2022-05-15 | Disposition: A | Discharge status: Home / Self Care | Payer: Medicare HMO | Attending: Emergency Medicine | Admitting: Emergency Medicine

## 2022-05-15 ENCOUNTER — Emergency Department: Payer: Medicare HMO

## 2022-05-15 ENCOUNTER — Other Ambulatory Visit: Payer: Self-pay

## 2022-05-15 DIAGNOSIS — R531 Weakness: Secondary | ICD-10-CM | POA: Diagnosis not present

## 2022-05-15 DIAGNOSIS — E860 Dehydration: Secondary | ICD-10-CM | POA: Diagnosis not present

## 2022-05-15 LAB — HEPATIC FUNCTION PANEL
ALT: 23 U/L (ref 0–44)
AST: 24 U/L (ref 15–41)
Albumin: 3.3 g/dL — ABNORMAL LOW (ref 3.5–5.0)
Alkaline Phosphatase: 65 U/L (ref 38–126)
Bilirubin, Direct: <0.1 mg/dL (ref 0.0–0.2)
Total Bilirubin: 0.4 mg/dL (ref 0.3–1.2)
Total Protein: 6.1 g/dL — ABNORMAL LOW (ref 6.5–8.1)

## 2022-05-15 LAB — URINALYSIS, ROUTINE W REFLEX MICROSCOPIC
Bilirubin Urine: NEGATIVE
Glucose, UA: NEGATIVE mg/dL
Ketones, ur: NEGATIVE mg/dL
Nitrite: POSITIVE — AB
Protein, ur: NEGATIVE mg/dL
Specific Gravity, Urine: 1.012 (ref 1.005–1.030)
pH: 5 (ref 5.0–8.0)

## 2022-05-15 LAB — BASIC METABOLIC PANEL
Anion gap: 7 (ref 5–15)
BUN: 14 mg/dL (ref 8–23)
CO2: 27 mmol/L (ref 22–32)
Calcium: 9.5 mg/dL (ref 8.9–10.3)
Chloride: 106 mmol/L (ref 98–111)
Creatinine, Ser: 0.92 mg/dL (ref 0.44–1.00)
GFR, Estimated: >60 mL/min (ref >60)
Glucose, Bld: 101 mg/dL — ABNORMAL HIGH (ref 70–99)
Potassium: 3.5 mmol/L (ref 3.5–5.1)
Sodium: 140 mmol/L (ref 135–145)

## 2022-05-15 LAB — CBC
HCT: 38.8 % (ref 36.0–46.0)
Hemoglobin: 12.1 g/dL (ref 12.0–15.0)
MCH: 28.9 pg (ref 26.0–34.0)
MCHC: 31.2 g/dL (ref 30.0–36.0)
MCV: 92.8 fL (ref 80.0–100.0)
Platelets: 423 10*3/uL — ABNORMAL HIGH (ref 150–400)
RBC: 4.18 MIL/uL (ref 3.87–5.11)
RDW: 14.6 % (ref 11.5–15.5)
WBC: 9.6 10*3/uL (ref 4.0–10.5)
nRBC: 0 % (ref 0.0–0.2)

## 2022-05-15 LAB — TROPONIN I (HIGH SENSITIVITY): Troponin I (High Sensitivity): 6 ng/L (ref <18)

## 2022-05-15 LAB — LIPASE, BLOOD: Lipase: 32 U/L (ref 11–51)

## 2022-05-15 MED ORDER — SODIUM CHLORIDE 0.9 % IV BOLUS
1000.0000 mL | Freq: Once | INTRAVENOUS | Status: AC
  Administered 2022-05-15: 1000 mL via INTRAVENOUS

## 2022-05-15 NOTE — ED Provider Notes (Signed)
[facility] Provider Note    Event Date/Time   First MD Initiated Contact with Patient 05/15/22 1518     (approximate)   History   Weakness   HPI  [name] is a 81 y.o. female status post recent right knee surgery presents to the ER for generalized weakness and malaise concern for dehydration.  Has had decreased p.o. intake.  Has had no bowel movements more than a week secondary to constipation in postop setting.  States that she was sent over from PT today because she was feeling weak particular with standing.  Denies any lateralizing weakness.  Denies any chest pain or pressure.  No diarrhea.     Physical Exam   Triage Vital Signs: ED Triage Vitals [05/15/22 1104]  Enc Vitals Group     BP (!) 158/71     Pulse Rate 75     Resp 17     Temp 98.1 F (36.7 C)     Temp Source Oral     SpO2 94 %     Weight 116 lb 13.5 oz (53 kg)     Height 4' 11.5" (1.511 m)     Head Circumference      Peak Flow      Pain Score      Pain Loc      Pain Edu?      Excl. in [place]?     Most recent vital signs: Vitals:   05/15/22 1413 05/15/22 1731  BP: (!) 165/74 (!) 157/64  Pulse: 77 63  Resp: 17 17  Temp: 98.4 F (36.9 C)   SpO2: 98% 100%     Constitutional: Alert  Eyes: Conjunctivae are normal.  Head: Atraumatic. Nose: No congestion/rhinnorhea. Mouth/Throat: Mucous membranes are moist.   Neck: Painless ROM.  Cardiovascular:   Good peripheral circulation. Respiratory: Normal respiratory effort.  No retractions.  Gastrointestinal: Soft and nontender in all four quadrants  Musculoskeletal:  no deformity.  Right knee incision c./d/I.  no edema, no cellulitis Neurologic:  MAE spontaneously. No gross focal neurologic deficits are appreciated.  Skin:  Skin is warm, dry and intact. No rash noted. Psychiatric: Mood and affect are normal. Speech and behavior are normal.    ED Results / Procedures / Treatments   Labs (all labs ordered are listed, but only  abnormal results are displayed) Labs Reviewed  BASIC METABOLIC PANEL - Abnormal; Notable for the following components:      Result Value   Glucose, Bld 101 (*)    All other components within normal limits  CBC - Abnormal; Notable for the following components:   Platelets 423 (*)    All other components within normal limits  URINALYSIS, ROUTINE W REFLEX MICROSCOPIC - Abnormal; Notable for the following components:   Color, Urine YELLOW (*)    APPearance HAZY (*)    Hgb urine dipstick SMALL (*)    Nitrite POSITIVE (*)    Leukocytes,Ua SMALL (*)    Bacteria, UA MANY (*)    All other components within normal limits  HEPATIC FUNCTION PANEL - Abnormal; Notable for the following components:   Total Protein 6.1 (*)    Albumin 3.3 (*)    All other components within normal limits  LIPASE, BLOOD  CBG MONITORING, ED  TROPONIN I (HIGH SENSITIVITY)  TROPONIN I (HIGH SENSITIVITY)     EKG  ED ECG REPORT I, Merlyn Lot, the attending physician, personally viewed and interpreted this ECG.   Date: 05/15/2022  EKG Time: 11:10  Rate: 65  Rhythm: sinus  Axis: normal  Intervals: normal  ST&T Change: no stemi, no depressions    RADIOLOGY Please see ED Course for my review and interpretation.  I personally reviewed all radiographic images ordered to evaluate for the above acute complaints and reviewed radiology reports and findings.  These findings were personally discussed with the patient.  Please see medical record for radiology report.    PROCEDURES:  Critical Care performed:   Procedures   MEDICATIONS ORDERED IN ED: Medications  sodium chloride 0.9 % bolus 1,000 mL (0 mLs Intravenous Stopped 05/15/22 1731)     IMPRESSION / MDM / ASSESSMENT AND PLAN / ED COURSE  I reviewed the triage vital signs and the nursing notes.                              Differential diagnosis includes, but is not limited to, dehydration, anemia, constipation, doubt sbo, dvt, acs  Patient  presents to the ER for evaluation of presentation as described above.  She is now feeling well requesting something to eat or drink denies any chest pain or pressure.  Given her post op state, this presenting complaint could reflect a potentially life-threatening illness therefore the patient will be placed on continuous pulse oximetry and telemetry for monitoring.  Laboratory evaluation will be sent to evaluate for the above complaints.  Abdominal exam is soft benign.  Order x-ray to evaluate for infiltrate or obstructive pattern have lower suspicion for SBO.  She has been on anticoagulation postoperatively no exam findings to suggest DVT.  Clinically not endorsing any shortness of breath or chest discomfort does not seem consistent with PE.  Will give IV fluids as a suspect dehydration.  Clinical Course as of 05/15/22 1804  Thu May 15, 2022  1557 X-ray chest and abdomen on my interpretation does not show any evidence of infiltrate or obstructive pattern. [PR]  1803 Patient reassessed.  She is feeling much improved after IV fluids.  She is tolerating p.o.  Her abdominal exam soft benign.  Blood work is reassuring.  She denies any chest pain or discomfort.  Does not seem cardiac in etiology.  No obstructive findings on her x-ray.  At this point she does appear stable and appropriate for outpatient follow-up. [PR]    Clinical Course User Index [PR] Merlyn Lot, MD    FINAL CLINICAL IMPRESSION(S) / ED DIAGNOSES   Final diagnoses:  Weakness  Dehydration     Rx / DC Orders   ED Discharge Orders     None        Note:  This document was prepared using Dragon voice recognition software and may include unintentional dictation errors.    Merlyn Lot, MD 05/15/22 [phone]

## 2022-05-15 NOTE — ED Triage Notes (Signed)
Patient to ER via POV with complaints of generalized weakness, reports having a knee replacement 5/22 and not feeling well since. Reports good appetite. States that she was constipated due to the medications, last BM approx 1 week ago. Denies urinary symptoms.

## 2022-05-20 DIAGNOSIS — Z96651 Presence of right artificial knee joint: Secondary | ICD-10-CM | POA: Diagnosis not present

## 2022-05-20 DIAGNOSIS — M25561 Pain in right knee: Secondary | ICD-10-CM | POA: Diagnosis not present

## 2022-05-27 DIAGNOSIS — M25561 Pain in right knee: Secondary | ICD-10-CM | POA: Diagnosis not present

## 2022-05-27 DIAGNOSIS — Z96651 Presence of right artificial knee joint: Secondary | ICD-10-CM | POA: Diagnosis not present

## 2022-05-29 DIAGNOSIS — M25561 Pain in right knee: Secondary | ICD-10-CM | POA: Diagnosis not present

## 2022-05-29 DIAGNOSIS — Z96651 Presence of right artificial knee joint: Secondary | ICD-10-CM | POA: Diagnosis not present

## 2022-06-02 DIAGNOSIS — M25561 Pain in right knee: Secondary | ICD-10-CM | POA: Diagnosis not present

## 2022-06-02 DIAGNOSIS — Z96651 Presence of right artificial knee joint: Secondary | ICD-10-CM | POA: Diagnosis not present

## 2022-06-04 DIAGNOSIS — M25561 Pain in right knee: Secondary | ICD-10-CM | POA: Diagnosis not present

## 2022-06-04 DIAGNOSIS — Z96651 Presence of right artificial knee joint: Secondary | ICD-10-CM | POA: Diagnosis not present

## 2022-06-09 DIAGNOSIS — Z96651 Presence of right artificial knee joint: Secondary | ICD-10-CM | POA: Diagnosis not present

## 2022-06-09 DIAGNOSIS — M25561 Pain in right knee: Secondary | ICD-10-CM | POA: Diagnosis not present

## 2022-06-11 DIAGNOSIS — Z471 Aftercare following joint replacement surgery: Secondary | ICD-10-CM | POA: Diagnosis not present

## 2022-06-13 DIAGNOSIS — Z96651 Presence of right artificial knee joint: Secondary | ICD-10-CM | POA: Diagnosis not present

## 2022-06-13 DIAGNOSIS — M25561 Pain in right knee: Secondary | ICD-10-CM | POA: Diagnosis not present

## 2022-06-17 DIAGNOSIS — Z96651 Presence of right artificial knee joint: Secondary | ICD-10-CM | POA: Diagnosis not present

## 2022-06-18 DIAGNOSIS — H04412 Chronic dacryocystitis of left lacrimal passage: Secondary | ICD-10-CM | POA: Diagnosis not present

## 2022-06-18 DIAGNOSIS — B0239 Other herpes zoster eye disease: Secondary | ICD-10-CM | POA: Diagnosis not present

## 2022-06-18 DIAGNOSIS — M3501 Sicca syndrome with keratoconjunctivitis: Secondary | ICD-10-CM | POA: Diagnosis not present

## 2022-07-03 DIAGNOSIS — H04412 Chronic dacryocystitis of left lacrimal passage: Secondary | ICD-10-CM | POA: Diagnosis not present

## 2022-07-23 DIAGNOSIS — I10 Essential (primary) hypertension: Secondary | ICD-10-CM | POA: Diagnosis not present

## 2022-07-23 DIAGNOSIS — F419 Anxiety disorder, unspecified: Secondary | ICD-10-CM | POA: Diagnosis not present

## 2022-07-23 DIAGNOSIS — E039 Hypothyroidism, unspecified: Secondary | ICD-10-CM | POA: Diagnosis not present

## 2022-07-23 DIAGNOSIS — Z79899 Other long term (current) drug therapy: Secondary | ICD-10-CM | POA: Diagnosis not present

## 2022-07-31 DIAGNOSIS — B0239 Other herpes zoster eye disease: Secondary | ICD-10-CM | POA: Diagnosis not present

## 2022-08-18 NOTE — Discharge Instructions (Signed)
Instructions after Total Knee Replacement   [name] P. Elvin Banker, Jr., M.D.     Dept. of Orthopaedics & Sports Medicine  [facility]  [address]  [place], [place]  [address]  Phone: [phone]   Fax: [phone]    DIET: Drink plenty of non-alcoholic fluids. Resume your normal diet. Include foods high in fiber.  ACTIVITY:  You may use crutches or a walker with weight-bearing as tolerated, unless instructed otherwise. You may be weaned off of the walker or crutches by your Physical Therapist.  Do NOT place pillows under the knee. Anything placed under the knee could limit your ability to straighten the knee.   Continue doing gentle exercises. Exercising will reduce the pain and swelling, increase motion, and prevent muscle weakness.   Please continue to use the TED compression stockings for 6 weeks. You may remove the stockings at night, but should reapply them in the morning. Do not drive or operate any equipment until instructed.  WOUND CARE:  Continue to use the PolarCare or ice packs periodically to reduce pain and swelling. You may bathe or shower after the staples are removed at the first office visit following surgery.  MEDICATIONS: You may resume your regular medications. Please take the pain medication as prescribed on the medication. Do not take pain medication on an empty stomach. You have been given a prescription for a blood thinner (Lovenox or Coumadin). Please take the medication as instructed. (NOTE: After completing a 2 week course of Lovenox, take one Enteric-coated aspirin once a day. This along with elevation will help reduce the possibility of phlebitis in your operated leg.) Do not drive or drink alcoholic beverages when taking pain medications.  CALL THE OFFICE FOR: Temperature above 101 degrees Excessive bleeding or drainage on the dressing. Excessive swelling, coldness, or paleness of the toes. Persistent nausea and vomiting.  FOLLOW-UP:  You  should have an appointment to return to the office in 10-14 days after surgery. Arrangements have been made for continuation of Physical Therapy (either home therapy or outpatient therapy).   [facility] Department Directory         [email or web address]       [email or web address]          Cardiology  Appointments: [place] - [phone] [place] - [phone]  Endocrinology  Appointments: [place] - [phone] [place] - [phone]  Gastroenterology  Appointments: [place] - [phone] [place] - [phone]        General Surgery   Appointments: [place] - [phone]  Internal Medicine/Family Medicine  Appointments: [place] - [phone] [place] - [phone] [place] - [phone]  Metabolic and Weigh Loss Surgery  Appointments: [place] - [phone]        Neurology  Appointments: [place] - [phone] [place] - [phone]  Neurosurgery  Appointments: [place] - [phone]  Obstetrics & Gynecology  Appointments: [place] - [phone] [place] - [phone]        Pediatrics  Appointments: [place] - [phone] [place] - [phone]  Physiatry  Appointments: [place] -[phone]  Physical Therapy  Appointments: [place] - [phone] [place] - [phone]        Podiatry  Appointments: [place] - [phone] [place] - [phone]  Pulmonology  Appointments: [place] - [phone]  Rheumatology  Appointments: [place] - [phone]        [place] Location: [facility]  [address] , [place]  [address]  [place] Location: [facility] [address] [place], [place]  [address]  [place] Location: [facility] [address] [place], [place]  [address]    

## 2022-08-19 ENCOUNTER — Encounter (HOSPITAL_COMMUNITY): Payer: Self-pay

## 2022-08-19 ENCOUNTER — Other Ambulatory Visit: Payer: Self-pay

## 2022-08-19 ENCOUNTER — Observation Stay (HOSPITAL_COMMUNITY): Payer: Medicare HMO

## 2022-08-19 ENCOUNTER — Emergency Department (HOSPITAL_COMMUNITY): Payer: Medicare HMO

## 2022-08-19 ENCOUNTER — Other Ambulatory Visit (HOSPITAL_COMMUNITY): Payer: Medicare HMO

## 2022-08-19 ENCOUNTER — Inpatient Hospital Stay (HOSPITAL_COMMUNITY)
Admission: EM | Admit: 2022-08-19 | Discharge: 2022-08-22 | DRG: 065 | Disposition: A | Discharge status: Rehabilitation Facility | Payer: Medicare HMO | Attending: Family Medicine | Admitting: Family Medicine

## 2022-08-19 ENCOUNTER — Encounter (HOSPITAL_COMMUNITY): Payer: Self-pay | Admitting: Urgent Care

## 2022-08-19 DIAGNOSIS — I69354 Hemiplegia and hemiparesis following cerebral infarction affecting left non-dominant side: Secondary | ICD-10-CM | POA: Diagnosis not present

## 2022-08-19 DIAGNOSIS — R768 Other specified abnormal immunological findings in serum: Secondary | ICD-10-CM | POA: Diagnosis present

## 2022-08-19 DIAGNOSIS — J45909 Unspecified asthma, uncomplicated: Secondary | ICD-10-CM | POA: Diagnosis present

## 2022-08-19 DIAGNOSIS — R29810 Facial weakness: Secondary | ICD-10-CM | POA: Diagnosis present

## 2022-08-19 DIAGNOSIS — B0229 Other postherpetic nervous system involvement: Secondary | ICD-10-CM | POA: Diagnosis present

## 2022-08-19 DIAGNOSIS — H5340 Unspecified visual field defects: Secondary | ICD-10-CM | POA: Diagnosis present

## 2022-08-19 DIAGNOSIS — G2581 Restless legs syndrome: Secondary | ICD-10-CM | POA: Diagnosis present

## 2022-08-19 DIAGNOSIS — R4182 Altered mental status, unspecified: Secondary | ICD-10-CM | POA: Diagnosis not present

## 2022-08-19 DIAGNOSIS — I6622 Occlusion and stenosis of left posterior cerebral artery: Secondary | ICD-10-CM | POA: Diagnosis not present

## 2022-08-19 DIAGNOSIS — F40240 Claustrophobia: Secondary | ICD-10-CM | POA: Diagnosis present

## 2022-08-19 DIAGNOSIS — E876 Hypokalemia: Secondary | ICD-10-CM | POA: Diagnosis present

## 2022-08-19 DIAGNOSIS — K219 Gastro-esophageal reflux disease without esophagitis: Secondary | ICD-10-CM | POA: Diagnosis present

## 2022-08-19 DIAGNOSIS — G8194 Hemiplegia, unspecified affecting left nondominant side: Secondary | ICD-10-CM | POA: Diagnosis present

## 2022-08-19 DIAGNOSIS — N179 Acute kidney failure, unspecified: Secondary | ICD-10-CM | POA: Diagnosis not present

## 2022-08-19 DIAGNOSIS — R29708 NIHSS score 8: Secondary | ICD-10-CM | POA: Diagnosis present

## 2022-08-19 DIAGNOSIS — I6389 Other cerebral infarction: Secondary | ICD-10-CM | POA: Diagnosis not present

## 2022-08-19 DIAGNOSIS — I10 Essential (primary) hypertension: Secondary | ICD-10-CM | POA: Diagnosis present

## 2022-08-19 DIAGNOSIS — Z888 Allergy status to other drugs, medicaments and biological substances status: Secondary | ICD-10-CM

## 2022-08-19 DIAGNOSIS — D72829 Elevated white blood cell count, unspecified: Secondary | ICD-10-CM | POA: Diagnosis present

## 2022-08-19 DIAGNOSIS — G4489 Other headache syndrome: Secondary | ICD-10-CM | POA: Diagnosis not present

## 2022-08-19 DIAGNOSIS — R471 Dysarthria and anarthria: Secondary | ICD-10-CM | POA: Diagnosis present

## 2022-08-19 DIAGNOSIS — R1084 Generalized abdominal pain: Secondary | ICD-10-CM | POA: Diagnosis not present

## 2022-08-19 DIAGNOSIS — Z8249 Family history of ischemic heart disease and other diseases of the circulatory system: Secondary | ICD-10-CM | POA: Diagnosis not present

## 2022-08-19 DIAGNOSIS — F419 Anxiety disorder, unspecified: Secondary | ICD-10-CM | POA: Diagnosis present

## 2022-08-19 DIAGNOSIS — R4701 Aphasia: Secondary | ICD-10-CM | POA: Diagnosis present

## 2022-08-19 DIAGNOSIS — Z79899 Other long term (current) drug therapy: Secondary | ICD-10-CM

## 2022-08-19 DIAGNOSIS — E785 Hyperlipidemia, unspecified: Secondary | ICD-10-CM | POA: Diagnosis present

## 2022-08-19 DIAGNOSIS — R112 Nausea with vomiting, unspecified: Secondary | ICD-10-CM | POA: Diagnosis not present

## 2022-08-19 DIAGNOSIS — Z882 Allergy status to sulfonamides status: Secondary | ICD-10-CM

## 2022-08-19 DIAGNOSIS — Z8673 Personal history of transient ischemic attack (TIA), and cerebral infarction without residual deficits: Secondary | ICD-10-CM | POA: Diagnosis not present

## 2022-08-19 DIAGNOSIS — G43109 Migraine with aura, not intractable, without status migrainosus: Secondary | ICD-10-CM | POA: Diagnosis present

## 2022-08-19 DIAGNOSIS — Z7982 Long term (current) use of aspirin: Secondary | ICD-10-CM

## 2022-08-19 DIAGNOSIS — F32A Depression, unspecified: Secondary | ICD-10-CM | POA: Diagnosis present

## 2022-08-19 DIAGNOSIS — E039 Hypothyroidism, unspecified: Secondary | ICD-10-CM | POA: Diagnosis present

## 2022-08-19 DIAGNOSIS — Z96651 Presence of right artificial knee joint: Secondary | ICD-10-CM | POA: Diagnosis present

## 2022-08-19 DIAGNOSIS — R531 Weakness: Secondary | ICD-10-CM | POA: Diagnosis present

## 2022-08-19 DIAGNOSIS — I161 Hypertensive emergency: Secondary | ICD-10-CM | POA: Diagnosis present

## 2022-08-19 DIAGNOSIS — R110 Nausea: Secondary | ICD-10-CM | POA: Diagnosis not present

## 2022-08-19 DIAGNOSIS — Z66 Do not resuscitate: Secondary | ICD-10-CM | POA: Diagnosis present

## 2022-08-19 DIAGNOSIS — I639 Cerebral infarction, unspecified: Secondary | ICD-10-CM | POA: Insufficient documentation

## 2022-08-19 DIAGNOSIS — I6381 Other cerebral infarction due to occlusion or stenosis of small artery: Secondary | ICD-10-CM | POA: Diagnosis present

## 2022-08-19 DIAGNOSIS — I69391 Dysphagia following cerebral infarction: Secondary | ICD-10-CM | POA: Diagnosis not present

## 2022-08-19 DIAGNOSIS — I6503 Occlusion and stenosis of bilateral vertebral arteries: Secondary | ICD-10-CM | POA: Diagnosis not present

## 2022-08-19 DIAGNOSIS — R451 Restlessness and agitation: Secondary | ICD-10-CM | POA: Diagnosis present

## 2022-08-19 DIAGNOSIS — Z7952 Long term (current) use of systemic steroids: Secondary | ICD-10-CM

## 2022-08-19 DIAGNOSIS — M1712 Unilateral primary osteoarthritis, left knee: Secondary | ICD-10-CM | POA: Diagnosis not present

## 2022-08-19 DIAGNOSIS — Z79890 Hormone replacement therapy (postmenopausal): Secondary | ICD-10-CM

## 2022-08-19 LAB — DIFFERENTIAL
Abs Immature Granulocytes: 0.12 10*3/uL — ABNORMAL HIGH (ref 0.00–0.07)
Basophils Absolute: 0.1 10*3/uL (ref 0.0–0.1)
Basophils Relative: 1 %
Eosinophils Absolute: 0.2 10*3/uL (ref 0.0–0.5)
Eosinophils Relative: 1 %
Immature Granulocytes: 1 %
Lymphocytes Relative: 15 %
Lymphs Abs: 2.5 10*3/uL (ref 0.7–4.0)
Monocytes Absolute: 1.8 10*3/uL — ABNORMAL HIGH (ref 0.1–1.0)
Monocytes Relative: 11 %
Neutro Abs: 12.1 10*3/uL — ABNORMAL HIGH (ref 1.7–7.7)
Neutrophils Relative %: 71 %

## 2022-08-19 LAB — COMPREHENSIVE METABOLIC PANEL
ALT: 18 U/L (ref 0–44)
AST: 20 U/L (ref 15–41)
Albumin: 3.2 g/dL — ABNORMAL LOW (ref 3.5–5.0)
Alkaline Phosphatase: 43 U/L (ref 38–126)
Anion gap: 10 (ref 5–15)
BUN: 11 mg/dL (ref 8–23)
CO2: 27 mmol/L (ref 22–32)
Calcium: 9.1 mg/dL (ref 8.9–10.3)
Chloride: 101 mmol/L (ref 98–111)
Creatinine, Ser: 1 mg/dL (ref 0.44–1.00)
GFR, Estimated: 57 mL/min — ABNORMAL LOW (ref >60)
Glucose, Bld: 122 mg/dL — ABNORMAL HIGH (ref 70–99)
Potassium: 3.4 mmol/L — ABNORMAL LOW (ref 3.5–5.1)
Sodium: 138 mmol/L (ref 135–145)
Total Bilirubin: 0.8 mg/dL (ref 0.3–1.2)
Total Protein: 5.9 g/dL — ABNORMAL LOW (ref 6.5–8.1)

## 2022-08-19 LAB — CBG MONITORING, ED
Glucose-Capillary: 123 mg/dL — ABNORMAL HIGH (ref 70–99)
Glucose-Capillary: 118 mg/dL — ABNORMAL HIGH (ref 70–99)

## 2022-08-19 LAB — I-STAT CHEM 8, ED
BUN: 12 mg/dL (ref 8–23)
Calcium, Ion: 1.11 mmol/L — ABNORMAL LOW (ref 1.15–1.40)
Chloride: 98 mmol/L (ref 98–111)
Creatinine, Ser: 0.8 mg/dL (ref 0.44–1.00)
Glucose, Bld: 120 mg/dL — ABNORMAL HIGH (ref 70–99)
HCT: 43 % (ref 36.0–46.0)
Hemoglobin: 14.6 g/dL (ref 12.0–15.0)
Potassium: 3.6 mmol/L (ref 3.5–5.1)
Sodium: 136 mmol/L (ref 135–145)
TCO2: 30 mmol/L (ref 22–32)

## 2022-08-19 LAB — CBC
HCT: 41.2 % (ref 36.0–46.0)
Hemoglobin: 13.1 g/dL (ref 12.0–15.0)
MCH: 28.9 pg (ref 26.0–34.0)
MCHC: 31.8 g/dL (ref 30.0–36.0)
MCV: 90.9 fL (ref 80.0–100.0)
Platelets: 311 10*3/uL (ref 150–400)
RBC: 4.53 MIL/uL (ref 3.87–5.11)
RDW: 15.6 % — ABNORMAL HIGH (ref 11.5–15.5)
WBC: 16.8 10*3/uL — ABNORMAL HIGH (ref 4.0–10.5)
nRBC: 0 % (ref 0.0–0.2)

## 2022-08-19 LAB — PROTIME-INR
INR: 1 (ref 0.8–1.2)
Prothrombin Time: 13 seconds (ref 11.4–15.2)

## 2022-08-19 LAB — ETHANOL: Alcohol, Ethyl (B): <10 mg/dL (ref <10)

## 2022-08-19 LAB — APTT: aPTT: 23 seconds — ABNORMAL LOW (ref 24–36)

## 2022-08-19 MED ORDER — HYDRALAZINE HCL 20 MG/ML IJ SOLN
10.0000 mg | INTRAMUSCULAR | Status: DC | PRN
  Administered 2022-08-21: 10 mg via INTRAVENOUS
  Filled 2022-08-19 (×2): qty 1

## 2022-08-19 MED ORDER — PANTOPRAZOLE SODIUM 40 MG PO TBEC
40.0000 mg | DELAYED_RELEASE_TABLET | Freq: Every day | ORAL | Status: DC
  Administered 2022-08-20 – 2022-08-22 (×3): 40 mg via ORAL
  Filled 2022-08-19 (×3): qty 1

## 2022-08-19 MED ORDER — LABETALOL HCL 5 MG/ML IV SOLN: 20.0000 mg | INTRAVENOUS | Status: DC | PRN

## 2022-08-19 MED ORDER — ALBUTEROL SULFATE (2.5 MG/3ML) 0.083% IN NEBU
2.5000 mg | INHALATION_SOLUTION | RESPIRATORY_TRACT | Status: DC | PRN
  Administered 2022-08-21: 2.5 mg via RESPIRATORY_TRACT
  Filled 2022-08-19: qty 3

## 2022-08-19 MED ORDER — SODIUM CHLORIDE 0.9% FLUSH
3.0000 mL | Freq: Once | INTRAVENOUS | Status: AC
  Administered 2022-08-19: 3 mL via INTRAVENOUS

## 2022-08-19 MED ORDER — METOCLOPRAMIDE HCL 5 MG/ML IJ SOLN
5.0000 mg | Freq: Once | INTRAMUSCULAR | Status: AC
  Administered 2022-08-19: 5 mg via INTRAVENOUS
  Filled 2022-08-19: qty 2

## 2022-08-19 MED ORDER — GABAPENTIN 300 MG PO CAPS
300.0000 mg | ORAL_CAPSULE | Freq: Three times a day (TID) | ORAL | Status: DC
  Administered 2022-08-20: 300 mg via ORAL
  Filled 2022-08-19 (×2): qty 1

## 2022-08-19 MED ORDER — LEVOTHYROXINE SODIUM 50 MCG PO TABS
50.0000 ug | ORAL_TABLET | Freq: Every day | ORAL | Status: DC
  Administered 2022-08-21 – 2022-08-22 (×2): 50 ug via ORAL
  Filled 2022-08-19 (×3): qty 1

## 2022-08-19 MED ORDER — KETOROLAC TROMETHAMINE 15 MG/ML IJ SOLN
15.0000 mg | Freq: Once | INTRAMUSCULAR | Status: AC
  Administered 2022-08-19: 15 mg via INTRAVENOUS
  Filled 2022-08-19: qty 1

## 2022-08-19 MED ORDER — FOLIC ACID 1 MG PO TABS
1.0000 mg | ORAL_TABLET | Freq: Every day | ORAL | Status: DC
  Administered 2022-08-20 – 2022-08-22 (×3): 1 mg via ORAL
  Filled 2022-08-19 (×3): qty 1

## 2022-08-19 MED ORDER — DULOXETINE HCL 30 MG PO CPEP
30.0000 mg | ORAL_CAPSULE | Freq: Every day | ORAL | Status: DC
  Administered 2022-08-20 – 2022-08-22 (×3): 30 mg via ORAL
  Filled 2022-08-19 (×3): qty 1

## 2022-08-19 MED ORDER — IOHEXOL 350 MG/ML SOLN
75.0000 mL | Freq: Once | INTRAVENOUS | Status: AC | PRN
  Administered 2022-08-19: 75 mL via INTRAVENOUS

## 2022-08-19 MED ORDER — ENOXAPARIN SODIUM 40 MG/0.4ML IJ SOSY: 40.0000 mg | PREFILLED_SYRINGE | INTRAMUSCULAR | Status: DC

## 2022-08-19 MED ORDER — SODIUM CHLORIDE 0.9 % IV SOLN: INTRAVENOUS | Status: DC

## 2022-08-19 MED ORDER — LORAZEPAM 2 MG/ML IJ SOLN
1.0000 mg | Freq: Once | INTRAMUSCULAR | Status: AC
  Administered 2022-08-19: 1 mg via INTRAVENOUS
  Filled 2022-08-19: qty 1

## 2022-08-19 MED ORDER — HYDROXYZINE HCL 25 MG PO TABS
25.0000 mg | ORAL_TABLET | Freq: Three times a day (TID) | ORAL | Status: DC | PRN
  Administered 2022-08-19 – 2022-08-20 (×2): 25 mg via ORAL
  Filled 2022-08-19 (×2): qty 1

## 2022-08-19 MED ORDER — LABETALOL HCL 5 MG/ML IV SOLN
20.0000 mg | Freq: Once | INTRAVENOUS | Status: AC
  Administered 2022-08-19: 20 mg via INTRAVENOUS

## 2022-08-19 NOTE — ED Notes (Signed)
This EMT went to reconnect this patient to the monitoring equipment to find the patient's family member trying to help the patient out of the bed. The patient is a fall risk and staff did not want her out of the bed. This EMT put her back into the bed, replaced the purwick and continued the monitoring equipment. The patient then became increasingly agitated and began to take her gown off and start ripping at the wiring. The patient then began to panic, becoming diaphoretic and flushed. This EMT then wet to wash rags with cold water to place on the patient for passive cooling. This EMT also repositioned the patient in order to make her more comfortable with no improvement. This EMT then notified the RN of this incident.

## 2022-08-19 NOTE — ED Provider Notes (Signed)
[facility] EMERGENCY DEPARTMENT Provider Note   CSN: [id number] Arrival date & time: 08/19/22  1232  An emergency department physician performed an initial assessment on this suspected stroke patient at 1232.  History  Chief Complaint  Patient presents with   Weakness    [name] is a 81 y.o. female.  HPI Patient presents as a code stroke.  Last seen normal was almost 24 hours ago.  Reportedly, per EMS, the patient was different yesterday, suddenly, but declined to be transported for evaluation.  However, today with ongoing symptoms she acquiesced to her husband's referral here for evaluation.  EMS reports patient was hemodynamically unremarkable in route, had right gaze preference left facial asymmetry and left upper arm weakness.    Home Medications Prior to Admission medications   Medication Sig Start Date End Date Taking? Authorizing Provider  Acidophilus Lactobacillus CAPS Take 1 capsule by mouth daily.    [provider]  albuterol (PROVENTIL HFA;VENTOLIN HFA) 108 (90 Base) MCG/ACT inhaler Inhale 2 puffs into the lungs every 4 (four) hours as needed for wheezing or shortness of breath.     [provider]  celecoxib (CELEBREX) 200 MG capsule Take 1 capsule (200 mg total) by mouth 2 (two) times daily. 04/29/22   Fausto Skillern, PA-C  cholecalciferol (VITAMIN D3) 25 MCG (1000 UNIT) tablet Take 1,000 Units by mouth daily.    [provider]  enoxaparin (LOVENOX) 40 MG/0.4ML injection Inject 0.4 mLs (40 mg total) into the skin daily for 14 days. 04/29/22 05/13/22  Tamala Julian B, PA-C  estrogens, conjugated, (PREMARIN) 1.25 MG tablet Take 1.25 mg by mouth daily.      [provider]  ferrous sulfate 325 (65 FE) MG tablet Take 325 mg by mouth daily.    [provider]  folic acid (FOLVITE) 025 MCG tablet Take 800 mcg by mouth daily.    [provider]  gabapentin (NEURONTIN) 300 MG capsule Take 300 mg by  mouth 3 (three) times daily.    [provider]  hydroxychloroquine (PLAQUENIL) 200 MG tablet Take 200 mg by mouth daily.    [provider]  levothyroxine (SYNTHROID, LEVOTHROID) 50 MCG tablet Take 50 mcg by mouth daily before breakfast.    [provider]  losartan (COZAAR) 50 MG tablet Take 50 mg by mouth daily.    [provider]  montelukast (SINGULAIR) 10 MG tablet Take 10 mg by mouth daily.  03/09/17   [provider]  Multiple Vitamins-Minerals (MULTIVITAMIN GUMMIES ADULT) CHEW Chew 2 capsules by mouth daily.    [provider]  oxyCODONE (OXY IR/ROXICODONE) 5 MG immediate release tablet Take 1 tablet (5 mg total) by mouth every 4 (four) hours as needed for severe pain. 04/29/22   Fausto Skillern, PA-C  pantoprazole (PROTONIX) 40 MG tablet Take 40 mg by mouth See admin instructions. Take 40 mg daily, may take a second 40 mg dose as needed for acid reflux 06/28/18 04/28/22  [provider]  Polyethyl Glycol-Propyl Glycol (SYSTANE OP) Place 1 drop into both eyes 2 (two) times daily as needed (dry eyes).    [provider]  predniSONE (DELTASONE) 10 MG tablet Take 10 mg by mouth daily.    [provider]  propranolol ER (INDERAL LA) 60 MG 24 hr capsule Take 60 mg by mouth daily.    [provider]  sertraline (ZOLOFT) 50 MG tablet Take 50 mg by mouth daily.    [provider]  Sodium Chloride-Xylitol (XLEAR SINUS CARE SPRAY NA) Place 1 spray into the nose daily as needed (congestion).    [provider]  traMADol (ULTRAM) 50 MG tablet Take 1 tablet (50 mg total) by mouth every 4 (four) hours as needed for moderate pain. 04/29/22   Fausto Skillern, PA-C  vitamin B-12 (CYANOCOBALAMIN) 1000 MCG tablet Take 1,000 mcg by mouth daily.    [provider]      Allergies    Cinobac [cinoxacin], Latex, Other, Petrolatum distillates [petroleum distillate], and Sulfa antibiotics    Review  of Systems   Review of Systems  Unable to perform ROS: Acuity of condition    Physical Exam Updated Vital Signs BP (!) 221/79   Pulse 76   Temp 98.5 F (36.9 C) (Oral)   Resp 20   Ht '4\' 11"'$  (1.499 m)   Wt 53.1 kg   SpO2 92%   BMI 23.63 kg/m  Physical Exam Vitals and nursing note reviewed.  Constitutional:      General: She is not in acute distress.    Appearance: She is well-developed. She is ill-appearing. She is not toxic-appearing.  HENT:     Head: Normocephalic and atraumatic.  Eyes:     Conjunctiva/sclera: Conjunctivae normal.  Cardiovascular:     Rate and Rhythm: Normal rate and regular rhythm.  Pulmonary:     Effort: Pulmonary effort is normal. No respiratory distress.     Breath sounds: Normal breath sounds. No stridor.  Abdominal:     General: There is no distension.  Skin:    General: Skin is warm and dry.  Neurological:     Mental Status: She is alert and oriented to person, place, and time.     Cranial Nerves: Dysarthria and facial asymmetry present. No cranial nerve deficit.     Motor: Weakness present. No tremor.     Coordination: Coordination abnormal.  Psychiatric:        Mood and Affect: Mood normal.     ED Results / Procedures / Treatments   Labs (all labs ordered are listed, but only abnormal results are displayed) Labs Reviewed  APTT - Abnormal; Notable for the following components:      Result Value   aPTT 23 (*)    All other components within normal limits  CBC - Abnormal; Notable for the following components:   WBC 16.8 (*)    RDW 15.6 (*)    All other components within normal limits  DIFFERENTIAL - Abnormal; Notable for the following components:   Neutro Abs 12.1 (*)    Monocytes Absolute 1.8 (*)    Abs Immature Granulocytes 0.12 (*)    All other components within normal limits  COMPREHENSIVE METABOLIC PANEL - Abnormal; Notable for the following components:   Potassium 3.4 (*)    Glucose, Bld 122 (*)    Total Protein 5.9 (*)     Albumin 3.2 (*)    GFR, Estimated 57 (*)    All other components within normal limits  I-STAT CHEM 8, ED - Abnormal; Notable for the following components:   Glucose, Bld 120 (*)    Calcium, Ion 1.11 (*)    All other components within normal limits  CBG MONITORING, ED - Abnormal; Notable for the following components:   Glucose-Capillary 123 (*)    All other components within normal limits  PROTIME-INR  ETHANOL    EKG EKG Interpretation  Date/Time:  Tuesday August 19 2022 13:02:42 EDT Ventricular Rate:  7  PR Interval:  190 QRS Duration: 99 QT Interval:  411 QTC Calculation: 492 R Axis:   73 Text Interpretation: Sinus rhythm Probable left atrial enlargement ST-t wave abnormality Abnormal ECG Confirmed by Carmin Muskrat [phone]) on 08/19/2022 1:43:59 PM  Radiology CT ANGIO HEAD NECK W WO CM (CODE STROKE)  Result Date: 08/19/2022 EXAM: CT ANGIOGRAPHY HEAD AND NECK TECHNIQUE: Multidetector CT imaging of the head and neck was performed using the standard protocol during bolus administration of intravenous contrast. Multiplanar CT image reconstructions and MIPs were obtained to evaluate the vascular anatomy. Carotid stenosis measurements (when applicable) are obtained utilizing NASCET criteria, using the distal internal carotid diameter as the denominator. RADIATION DOSE REDUCTION: This exam was performed according to the departmental dose-optimization program which includes automated exposure control, adjustment of the mA and/or kV according to patient size and/or use of iterative reconstruction technique. CONTRAST:  46m OMNIPAQUE IOHEXOL 350 MG/ML SOLN COMPARISON:  None Available. FINDINGS: CTA NECK FINDINGS Aortic arch: Great vessel origins are patent without significant stenosis. Right carotid system: No evidence of dissection, stenosis (50% or greater), or occlusion. Left carotid system: No evidence of dissection, stenosis (50% or greater), or occlusion. Vertebral arteries: Codominant.  No evidence of dissection, stenosis (50% or greater), or occlusion. Mild narrowing of right vertebral artery at the craniocervical junction due to mass effect from adjacent degenerative bony hypertrophy. Skeleton: Severe Rosai change at the craniocervical junction with rest change involving the dens and posterior calcified pannus posterior to the dens. No acute fracture. Other neck: No acute findings Upper chest: Biapical pleuroparenchymal scarring. Review of the MIP images confirms the above findings CTA HEAD FINDINGS Anterior circulation: Calcific atherosclerosis of the intracranial ICAs with mild narrowing bilaterally. Bilateral MCAs and ACAs are patent without proximal hemodynamically significant stenosis. Posterior circulation: Bilateral intradural vertebral arteries, basilar artery and bilateral posterior cerebral arteries are patent. Severe proximal P2 PCA stenosis. Venous sinuses: Not well evaluated due to arterial timing. Review of the MIP images confirms the above findings IMPRESSION: 1. No emergent large vessel occlusion. 2. Severe proximal left P2 PCA stenosis. 3. 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 Preliminary imaging results were communicated on 08/19/2022 at 12: 54 PM to provider Dr. PReeves ForthVia telephone. Electronically Signed   By: FMargaretha SheffieldM.D.   On: 08/19/2022 13:13   CT HEAD CODE STROKE WO CONTRAST  Result Date: 08/19/2022 CLINICAL DATA:  Code stroke.  Neuro deficit, acute, stroke suspected EXAM: CT HEAD WITHOUT CONTRAST TECHNIQUE: Contiguous axial images were obtained from the base of the skull through the vertex without intravenous contrast. RADIATION DOSE REDUCTION: This exam was performed according to the departmental dose-optimization program which includes automated exposure control, adjustment of the mA and/or kV according to patient size and/or use of iterative reconstruction technique. COMPARISON:  MRI head  03/23/2020. FINDINGS: Motion limited. Failed brain: No evidence of acute large vascular territory infarct, acute hemorrhage, mass lesion, midline shift or hydrocephalus. Patchy white matter hypodensities, nonspecific but compatible with chronic microvascular ischemic disease. Vascular: Limited assessment due to motion without obvious hyperdense vessel. Skull: No acute fracture. Sinuses/Orbits: No acute findings. ASPECTS ([facility]) Total score (0-10 with 10 being normal): 10. IMPRESSION: Motion limited assessment without obvious acute intracranial abnormality. MRI could provide more sensitive evaluation for acute infarct. Code stroke imaging results were communicated on 08/19/2022 at 12:54 Pm to provider Dr. PReeves ForthVia telephone, who verbally acknowledged these results. Electronically Signed   By:  Margaretha Sheffield M.D.   On: 08/19/2022 13:03    Procedures Procedures    Medications Ordered in ED Medications  LORazepam (ATIVAN) injection 1 mg (has no administration in time range)  ketorolac (TORADOL) 15 MG/ML injection 15 mg (has no administration in time range)  metoCLOPramide (REGLAN) injection 5 mg (has no administration in time range)  labetalol (NORMODYNE) injection 20 mg (has no administration in time range)  hydrALAZINE (APRESOLINE) injection 10 mg (has no administration in time range)  sodium chloride flush (NS) 0.9 % injection 3 mL (3 mLs Intravenous Given 08/19/22 1315)  iohexol (OMNIPAQUE) 350 MG/ML injection 75 mL (75 mLs Intravenous Contrast Given 08/19/22 1253)  labetalol (NORMODYNE) injection 20 mg (20 mg Intravenous Given 08/19/22 1250)    ED Course/ Medical Decision Making/ A&P This patient with a Hx of stroke, hypertension presents to the ED for concern of expressive aphasia, left upper extremity weakness, this involves an extensive number of treatment options, and is a complaint that carries with it a high risk of complications and morbidity.    The  differential diagnosis includes CVA, seizure with Todd's paralysis, hypertensive crisis   Social Determinants of Health:  Advanced age, hypertension  Additional history obtained:  Additional history and/or information obtained from husband at bedside, EMS on arrival, notable for husband present on repeat evaluation describes long-term difficulty with controlling the patient's blood pressure, and episodes of prior stroke, EMS details included above   After the initial evaluation, orders, including: Code stroke expeditious evaluation including head CT, labs monitoring were initiated.   Patient placed on Cardiac and Pulse-Oximetry Monitors. The patient was maintained on a cardiac monitor.  The cardiac monitored showed an rhythm of 75 sinus normal The patient was also maintained on pulse oximetry. The readings were typically 100% room air normal   On repeat evaluation of the patient improved 2:01 PM Speech is somewhat more clear, though it remains slow.  She is moving all extremities spontaneously, though she has persistent weakness in her left upper extremity.  Patient remains hypertensive, requiring beta-blocker. Lab Tests:  I personally interpreted labs.  The pertinent results include: Hyperglycemia  Imaging Studies ordered:  I independently visualized and interpreted imaging which showed no intracranial hemorrhage I agree with the radiologist interpretation  Consultations Obtained:  I requested consultation with the neurology,  and discussed lab and imaging findings as well as pertinent plan - they recommend: Case comanaged with neurology.  After initial head CT did not demonstrate hemorrhage, recommendations for MRI, EEG, blood pressure control with mild permissive hypertension, but systolic goal less than 580.  Patient has received beta-blocker, will require additional doses for this.   Dispostion / Final MDM:  After consideration of the diagnostic results and the patient's  response to treatment, patient with new expressive aphasia, left upper extremity weakness, hypertension concern for stroke versus hypertensive urgency.  Blood pressure improved, patient's clinical status improved, there is some suspicion for Todd's paralysis versus hypertensive urgency, stroke remains a consideration.  Patient admitted for further monitoring, management.  Final Clinical Impression(s) / ED Diagnoses Final diagnoses:  Expressive aphasia  Weakness  Hypertensive emergency   CRITICAL CARE Performed by: Carmin Muskrat Total critical care time: 35 minutes Critical care time was exclusive of separately billable procedures and treating other patients. Critical care was necessary to treat or prevent imminent or life-threatening deterioration. Critical care was time spent personally by me on the following activities: development of treatment plan with patient and/or surrogate as well as nursing, discussions  with consultants, evaluation of patient's response to treatment, examination of patient, obtaining history from patient or surrogate, ordering and performing treatments and interventions, ordering and review of laboratory studies, ordering and review of radiographic studies, pulse oximetry and re-evaluation of patient's condition.    Carmin Muskrat, MD 08/19/22 [phone]

## 2022-08-19 NOTE — Assessment & Plan Note (Signed)
Albuterol PRN 

## 2022-08-19 NOTE — Code Documentation (Signed)
Stroke Response Nurse Documentation Code Documentation  [name] is a 81 y.o. female arriving to [facility]  via [facility] on 08/19/22 with past medical hx of HTN, hypothyroidism, anxiety, seizure, shingles (with chronic pain). On aspirin 325 mg daily BID. Code stroke was activated by EMS.   Patient from home where she was LKW at 1330 9/11. Pt has stroke symptoms that come and go. She refuses to be seen at the hospital. Pt became lethargic with left sided weakness, facial droop, and slurred speech yesterday around 1330. Husband states to EMS that she refused to come in but he called 911 today when symptoms were worsening and not resolving. BP 243/80, NSR, CBG WNL with EMS.  N/V with EMS as well as 9/10 headache. Zofran given IV once in route.   Stroke team at the bedside on patient arrival. Labs drawn and patient cleared for CT by Dr. Vanita Panda. Patient to CT with team. NIHSS 8, see documentation for details and code stroke times. Patient with left hemianopia, left facial droop, left arm weakness, left leg weakness, and dysarthria  on exam. The following imaging was completed:  CT Head and CTA. Patient is not a candidate for IV Thrombolytic due to outside the window. Patient is not a candidate for IR due to no LVO on CTA per MD.    Care Plan: Q2 neuro checks/vitals, permissive HTN (220/120), EEG, MRI.   Bedside handoff with ED RN Luetta Nutting and Lysbeth Galas.    Luria Rosario, Rande Brunt  Stroke Response RN

## 2022-08-19 NOTE — Assessment & Plan Note (Addendum)
Uncontrolled today.  - Per neuro, permissive hypertension (< 220/120) and gradually normalize in 5-7 days - Holding home losartan and propranolol for now  - VS per floor protocol

## 2022-08-19 NOTE — Assessment & Plan Note (Addendum)
Was previously on ASA '325mg'$  BID per chart review but unclear why this dosage. - Aspirin 81 and plavix 75 mg for 3 weeks

## 2022-08-19 NOTE — Procedures (Addendum)
Patient Name: [name]  MRN: [id number]  Epilepsy Attending: Lora Havens  Referring Physician/Provider: Katy Apo, NP  Date: 08/19/2022 Duration: 28.13 mins  Patient history: 81 y.o. female arriving to [facility]  via [facility] on 08/19/22 with past medical hx of HTN, hypothyroidism, anxiety, seizure, shingles (with chronic pain). Pt became lethargic with left sided weakness, facial droop, and slurred speech. EEG to evaluate for seizure  Level of alertness: Awake  AEDs during EEG study: None  Technical aspects: This EEG study was done with scalp electrodes positioned according to the 10-20 International system of electrode placement. Electrical activity was reviewed with band pass filter of 1-'70Hz'$ , sensitivity of 7 uV/mm, display speed of 6m/sec with a '60Hz'$  notched filter applied as appropriate. EEG data were recorded continuously and digitally stored.  Video monitoring was available and reviewed as appropriate.  Description: The posterior dominant rhythm consists of '8Hz'$  activity of moderate voltage (25-35 uV) seen predominantly in posterior head regions, asymmetric ( right<left) and reactive to eye opening and eye closing. EEG showed continuous 3 to 6 Hz theta-delta slowing in right hemisphere. Hyperventilation and photic stimulation were not performed.     ABNORMALITY - Continuous slow, right hemisphere - Background asymmetry, right<left  IMPRESSION: This study is suggestive of cortical dysfunction arising from right hemisphere  likely secondary to underlying structural abnormality. No seizures or epileptiform discharges were seen throughout the recording.  Timiko Offutt OBarbra Sarks

## 2022-08-19 NOTE — Consult Note (Signed)
Neurology Consultation  Reason for Consult: left sided weakness Referring Physician: Dr. Vanita Panda  CC: left sided weakness, headache, nausea  History is obtained from:Patient, spouse and chart  HPI: [name] is a 81 y.o. female with history of knee replacement, seizure, HTN, hypothyroidism, shingles, headaches and anxiety who presents with left arm weakness, left visual field cut, dysarthria, nausea and vomiting.  Patient's symptoms began at 1330 yesterday, and she initially refused to be seen for them.  Today, her husband insisted she come to the ED.  Patient has had about for episodes in the past of headache, vision disturbance and left sided weakness which have resolved on their own in about 30 minutes without intervention.  She has not been seen for these episodes.  She does endorse a headache with nausea and vomiting today.  Patient reports that she has had a seizure in the past with generalized shaking after a back injury.  No seizure activity was noted by patient or husband prior to her presenting symptoms.  She was noted to be quite hypertensive on arrival with SBP in the 230s.   LKW: 9/11 1330 TNK given?: no, outside of window IR Thrombectomy? No, no LVO Modified Rankin Scale: 1-No significant post stroke disability and can perform usual duties with stroke symptoms  ROS: A complete ROS was performed and is negative except as noted in the HPI.   Past Medical History:  Diagnosis Date   Anemia    Arthritis    Asthma    uses inhaler just prior to surgery to avoid attack   Back pain    from previous injury   Complication of anesthesia    has woken  up during 2 different surgery   Depression    no current issue/treatment; situation   Gallstones    GERD (gastroesophageal reflux disease)    Hiatal hernia    patient does NOT have nerve/muscle disease   History of kidney stones    HLD (hyperlipidemia)    HTN (hypertension)    Hypothyroidism    Kidney stones    Knee pain     Non-diabetic pancreatic hormone dysfunction years   pt. 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   Pancreatitis    Pneumonia    Seizures (HCC)    caused by dye injected during a procedure   Shortness of breath    with exertion   Sinus problem    frequent infections/congestion   Stroke ([place]) 2021   reports having CVA in 2021 and having mini strokes before that   Thyroid disease      Family History  Problem Relation Age of Onset   Anesthesia problems Father        "bad lungs" couldn't wake him up   Heart disease Father    Heart attack Father 13   Breast cancer Maternal Grandmother    Breast cancer Maternal Aunt        x 2   Breast cancer Cousin    Pancreatic cancer Cousin    Heart attack Paternal Uncle    Stroke Paternal Grandfather      Social History:   reports that she has never smoked. She has never used smokeless tobacco. She reports that she does not currently use alcohol. She reports that she does not use drugs.  Medications  Current Facility-Administered Medications:    LORazepam (ATIVAN) injection 1 mg, 1 mg, Intravenous, Once, de Yolanda Manges, Cortney E, NP  Current Outpatient Medications:    Acidophilus Lactobacillus CAPS,  Take 1 capsule by mouth daily., Disp: , Rfl:    albuterol (PROVENTIL HFA;VENTOLIN HFA) 108 (90 Base) MCG/ACT inhaler, Inhale 2 puffs into the lungs every 4 (four) hours as needed for wheezing or shortness of breath. , Disp: , Rfl:    celecoxib (CELEBREX) 200 MG capsule, Take 1 capsule (200 mg total) by mouth 2 (two) times daily., Disp: 90 capsule, Rfl: 0   cholecalciferol (VITAMIN D3) 25 MCG (1000 UNIT) tablet, Take 1,000 Units by mouth daily., Disp: , Rfl:    enoxaparin (LOVENOX) 40 MG/0.4ML injection, Inject 0.4 mLs (40 mg total) into the skin daily for 14 days., Disp: 5.6 mL, Rfl: 0   estrogens, conjugated, (PREMARIN) 1.25 MG tablet, Take 1.25 mg by mouth daily.  , Disp: , Rfl:    ferrous sulfate 325 (65 FE) MG tablet, Take 325 mg by mouth  daily., Disp: , Rfl:    folic acid (FOLVITE) 875 MCG tablet, Take 800 mcg by mouth daily., Disp: , Rfl:    gabapentin (NEURONTIN) 300 MG capsule, Take 300 mg by mouth 3 (three) times daily., Disp: , Rfl:    hydroxychloroquine (PLAQUENIL) 200 MG tablet, Take 200 mg by mouth daily., Disp: , Rfl:    levothyroxine (SYNTHROID, LEVOTHROID) 50 MCG tablet, Take 50 mcg by mouth daily before breakfast., Disp: , Rfl:    losartan (COZAAR) 50 MG tablet, Take 50 mg by mouth daily., Disp: , Rfl:    montelukast (SINGULAIR) 10 MG tablet, Take 10 mg by mouth daily. , Disp: , Rfl:    Multiple Vitamins-Minerals (MULTIVITAMIN GUMMIES ADULT) CHEW, Chew 2 capsules by mouth daily., Disp: , Rfl:    oxyCODONE (OXY IR/ROXICODONE) 5 MG immediate release tablet, Take 1 tablet (5 mg total) by mouth every 4 (four) hours as needed for severe pain., Disp: 30 tablet, Rfl: 0   pantoprazole (PROTONIX) 40 MG tablet, Take 40 mg by mouth See admin instructions. Take 40 mg daily, may take a second 40 mg dose as needed for acid reflux, Disp: , Rfl:    Polyethyl Glycol-Propyl Glycol (SYSTANE OP), Place 1 drop into both eyes 2 (two) times daily as needed (dry eyes)., Disp: , Rfl:    predniSONE (DELTASONE) 10 MG tablet, Take 10 mg by mouth daily., Disp: , Rfl:    propranolol ER (INDERAL LA) 60 MG 24 hr capsule, Take 60 mg by mouth daily., Disp: , Rfl:    sertraline (ZOLOFT) 50 MG tablet, Take 50 mg by mouth daily., Disp: , Rfl:    Sodium Chloride-Xylitol (XLEAR SINUS CARE SPRAY NA), Place 1 spray into the nose daily as needed (congestion)., Disp: , Rfl:    traMADol (ULTRAM) 50 MG tablet, Take 1 tablet (50 mg total) by mouth every 4 (four) hours as needed for moderate pain., Disp: 30 tablet, Rfl: 0   vitamin B-12 (CYANOCOBALAMIN) 1000 MCG tablet, Take 1,000 mcg by mouth daily., Disp: , Rfl:    Exam: Current vital signs: BP (!) 214/70   Pulse 79   Temp 98.5 F (36.9 C) (Oral)   Resp 17   Ht '4\' 11"'$  (1.499 m)   Wt 53.1 kg   SpO2 96%    BMI 23.63 kg/m  Vital signs in last 24 hours: Temp:  [98.5 F (36.9 C)] 98.5 F (36.9 C) (09/12 1250) Pulse Rate:  [79-80] 79 (09/12 1305) Resp:  [17-18] 17 (09/12 1305) BP: (214-231)/(66-70) 214/70 (09/12 1305) SpO2:  [90 %-96 %] 96 % (09/12 1305) Weight:  [53.1 kg] 53.1 kg (09/12 1307)  GENERAL: Awake, alert, in mild distress due to pain Psych: Affect appropriate for situation, patient is anxious but cooperative with examination Head: Normocephalic and atraumatic, without obvious abnormality EENT: Normal conjunctivae, dry mucous membranes, no OP obstruction LUNGS: Normal respiratory effort. Non-labored breathing on room air CV: Regular rate and rhythm on telemetry Extremities: warm, well perfused, without obvious deformity, surgical scar on right knee  NEURO:  Mental Status: Awake, alert, and oriented to person, place, time, and situation. She is able to provide some history of present illness. Speech/Language: speech is slurred but flent.   Naming, repetition, fluency, and comprehension intact without aphasia  No neglect is noted Cranial Nerves:  II: PERRL, left sided visual field cut III, IV, VI: EOMI. Lid elevation symmetric and full.  V: Sensation is intact to light touch and symmetrical to face.  VII: Left sided facial droop present  VIII: Hearing intact to voice IX, X: Voice dysarthric.  XII: Tongue protrudes midline without fasciculations.   Motor: 5/5 strength to RUE and RLE, 1/5 to LUE, 3/5 to LLE  Tone is normal. Bulk is normal.  Sensation: Intact to light touch bilaterally in all four extremities. No extinction to DSS present.  Coordination: FTN intact on right  No pronator drift on right DTRs: 2+ throughout.  Gait: Deferred  NIHSS: 1a Level of Conscious.: 0 1b LOC Questions: 0 1c LOC Commands: 0 2 Best Gaze: 0 3 Visual: 2 4 Facial Palsy: 1 5a Motor Arm - left: 3 5b Motor Arm - Right: 0 6a Motor Leg - Left: 1 6b Motor Leg - Right: 0 7 Limb Ataxia: 0 8  Sensory: 0 9 Best Language: 0 10 Dysarthria: 1 11 Extinct. and Inatten.: 0 TOTAL: 8   Labs I have reviewed labs in epic and the results pertinent to this consultation are:   CBC    Component Value Date/Time   WBC 16.8 (H) 08/19/2022 1237   RBC 4.53 08/19/2022 1237   HGB 14.6 08/19/2022 1243   HCT 43.0 08/19/2022 1243   PLT 311 08/19/2022 1237   MCV 90.9 08/19/2022 1237   MCH 28.9 08/19/2022 1237   MCHC 31.8 08/19/2022 1237   RDW 15.6 (H) 08/19/2022 1237   LYMPHSABS 2.5 08/19/2022 1237   MONOABS 1.8 (H) 08/19/2022 1237   EOSABS 0.2 08/19/2022 1237   BASOSABS 0.1 08/19/2022 1237    CMP     Component Value Date/Time   NA 136 08/19/2022 1243   K 3.6 08/19/2022 1243   CL 98 08/19/2022 1243   CO2 27 05/15/2022 1103   GLUCOSE 120 (H) 08/19/2022 1243   BUN 12 08/19/2022 1243   CREATININE 0.80 08/19/2022 1243   CALCIUM 9.5 05/15/2022 1103   PROT 6.1 (L) 05/15/2022 1549   ALBUMIN 3.3 (L) 05/15/2022 1549   AST 24 05/15/2022 1549   ALT 23 05/15/2022 1549   ALKPHOS 65 05/15/2022 1549   BILITOT 0.4 05/15/2022 1549   GFRNONAA >60 05/15/2022 1103   GFRAA >60 09/28/2016 1206    Lipid Panel  No results found for: "CHOL", "TRIG", "HDL", "CHOLHDL", "VLDL", "LDLCALC", "LDLDIRECT"   Imaging I have reviewed the images obtained:  CT-scan of the brain: No acute abnormality  CTA head and neck:  No LVO, severe proximal left P2 PCA stenosis, severe erosive changes of dens with pannus  MRI examination of the brain pending  Assessment: 81 year old patient with history of HTN, anxiety, seizure, hypothyroidism, shingles, right knee replacement and headaches presents with left arm weakness, dysarthria, left visual  field cut, headache, nausea and vomiting.  She has had four episodes of headache with left sided weakness and visual disturbance in the past, but these have resolved on their own after about 30 minutes.  Patient has not sought care for these episodes.  She has a history of  seizure in the past after a back and neck injury, described as generalized shaking.  CT head negative for acute abnormality, CTA shows no LVO.  Will need MRI to investigate for stroke as well as EEG to rule out seizure with Todd's paralysis.  Complicated migraine could also be cause of patient's symptoms.  Impression:acute ischemic stroke vs seizure with Todd's paralysis vs. Complicated migraine  Recommendations: - STAT EEG - Brain MRI - Headache cocktail with toradol 15 mg and reglan 5 mg now due to headache with vomiting - IV labetalol to control blood pressure - admit to hospitalist service Stroke/TIA Workup  - Admit for stroke workup - Permissive HTN x48 hrs from sx onset or until stroke ruled out by MRI goal BP <220/110. PRN labetalol or hydralazine if BP above these parameters. Avoid oral antihypertensives. - MRI brain wo contrast - TTE  - Check A1c and LDL + add statin if LDL >70 - antiplt/anticoag pending MRI results - q4 hr neuro checks - STAT head CT for any change in neuro exam - Tele - PT/OT/SLP - Stroke education - Amb referral to neurology upon discharge   Pt seen by NP/Neuro and later by MD. Note/plan to be edited by MD as needed.  [place] , MSN, AGACNP-BC [facility] See Amion for schedule and pager information 08/19/2022 1:24 PM  ATTENDING ATTESTATION:  Code stroke evalaution. MDM: high.  Pt with left arm weakness, confusion over 22 hrs ago. Out of window for TNK. CTA neg for LVO. DDX: todd's paralysis vs subacute stroke vs. Complicated migraine. Stroke workkup as above with trial of Headache cocktail. EEG to r/o seizure.   Dr. Reeves Forth evaluated pt independently, reviewed imaging, chart, labs. Discussed and formulated plan with the Resident/APP. Changes were made to the note where appropriate. Please see APP/resident note above for details.        Danialle Dement,MD

## 2022-08-19 NOTE — Evaluation (Signed)
Clinical/Bedside Swallow Evaluation Patient Details  Name: [name] MRN: [id number] Date of Birth: 01/25/1941  Today's Date: 08/19/2022 Time: SLP Start Time (ACUTE ONLY): 75 SLP Stop Time (ACUTE ONLY): 1725 SLP Time Calculation (min) (ACUTE ONLY): 15 min  Past Medical History:  Past Medical History:  Diagnosis Date   Anemia    Arthritis    Asthma    uses inhaler just prior to surgery to avoid attack   Back pain    from previous injury   Complication of anesthesia    has woken  up during 2 different surgery   Depression    no current issue/treatment; situation   Gallstones    GERD (gastroesophageal reflux disease)    Hiatal hernia    patient does NOT have nerve/muscle disease   History of kidney stones    HLD (hyperlipidemia)    HTN (hypertension)    Hypothyroidism    Kidney stones    Knee pain    Non-diabetic pancreatic hormone dysfunction years   pt. 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   Pancreatitis    Pneumonia    Seizures (HCC)    caused by dye injected during a procedure   Shortness of breath    with exertion   Sinus problem    frequent infections/congestion   Stroke ([place]) 2021   reports having CVA in 2021 and having mini strokes before that   Thyroid disease    Past Surgical History:  Past Surgical History:  Procedure Laterality Date   ABDOMINAL HYSTERECTOMY     APPENDECTOMY     CARPAL TUNNEL RELEASE  10+ years ago   bilateral   EYE SURGERY  3 yrs ago   bilateral cataracts   FOOT OSTEOTOMY  6 weeks ago   Left foot: great, 2nd & 3rd   FOOT OSTEOTOMY  5 years ago   Right great toe   HAND SURGERY Bilateral 2011-most recent   multiple hand surgeries, 2 on left, 3 on right   KNEE ARTHROPLASTY Right 04/28/2022   Procedure: COMPUTER ASSISTED TOTAL KNEE ARTHROPLASTY;  Surgeon: Dereck Leep, MD;  Location: [facility];  Service: Orthopedics;  Laterality: Right;   LOOP RECORDER INSERTION N/A 05/16/2020   Procedure: LOOP RECORDER INSERTION;  Surgeon:  Isaias Cowman, MD;  Location: [facility];  Service: Cardiovascular;  Laterality: N/A;   NASAL SINUS SURGERY  most recent 7-8 yrs ago   7 sinus surgeries    TRIGGER FINGER RELEASE  11/19/2011   Procedure: RELEASE TRIGGER FINGER/A-1 PULLEY;  Surgeon: Wynonia Sours, MD;  Location: [facility];  Service: Orthopedics;  Laterality: Right;  release a-1 pulley right index finger and cyst removal   WRIST GANGLION EXCISION  1980's   right   HPI:  Patient is a 81 y.o. female with PMH: HTN,, hypothyroidism, anxiety, seizure, shingles. She presented to [facility] ED via [facility] on 08/19/22 secondary to acute left sided weakness, lethargy, facial droop and slurred speech. CT head did not show any acute intracranial abnormality and MRI ordered but not completed yet. Of note, MRI from previous admission in 2021 showed Small subacute infarct within the right frontal white matter. Patient was made NPO awaiting SLP swallow evaluation.    Assessment / Plan / Recommendation  Clinical Impression  Patient presents with a mild oropharyngeal dysphagia as per this bedside/clinical swallow evaluation. She was awake and alert but fatigued and reported feeling "awful". She told SLP that she has been biting the inside of her  cheeks since having reported previous stroke but this apparently happens as much with crunchy/hard foods as it does with softer foods. SLP did observe facial and bilabial weakness on left but this did not appear to significantly impact oral phase of swallow with liquids or purees. She took small cup sips of thin liquids (water) with a very audible swallow and suspected mild delay in swallow initiation but no overt s/s aspiration or penetration during or after PO intake. With puree solids, she took small bites and did not exhibit any difficulty. She then told SLP she did not want anymore (ate approximately 1/4 cup of applesauce and 4-5 sips of water). SLP recommending initaite Dys 3,  thin liquids diet and will f/u to ensure toleration. SLP Visit Diagnosis: Dysphagia, unspecified (R13.10)    Aspiration Risk  No limitations;Mild aspiration risk    Diet Recommendation Dysphagia 3 (Mech soft);Thin liquid   Liquid Administration via: Cup;Straw Medication Administration: Crushed with puree Supervision: Patient able to self feed;Intermittent supervision to cue for compensatory strategies Compensations: Slow rate;Small sips/bites Postural Changes: Seated upright at 90 degrees    Other  Recommendations Oral Care Recommendations: Oral care BID    Recommendations for follow up therapy are one component of a multi-disciplinary discharge planning process, led by the attending physician.  Recommendations may be updated based on patient status, additional functional criteria and insurance authorization.  Follow up Recommendations Follow physician's recommendations for discharge plan and follow up therapies      Assistance Recommended at Discharge Intermittent Supervision/Assistance  Functional Status Assessment Patient has had a recent decline in their functional status and demonstrates the ability to make significant improvements in function in a reasonable and predictable amount of time.  Frequency and Duration min 1 x/week  1 week       Prognosis Prognosis for Safe Diet Advancement: Good      Swallow Study   General Date of Onset: 08/19/22 HPI: Patient is a 81 y.o. female with PMH: HTN,, hypothyroidism, anxiety, seizure, shingles. She presented to [facility] ED via [facility] on 08/19/22 secondary to acute left sided weakness, lethargy, facial droop and slurred speech. CT head did not show any acute intracranial abnormality and MRI ordered but not completed yet. Of note, MRI from previous admission in 2021 showed Small subacute infarct within the right frontal white matter. Patient was made NPO awaiting SLP swallow evaluation. Type of Study: Bedside Swallow Evaluation Previous  Swallow Assessment: none found Diet Prior to this Study: NPO Temperature Spikes Noted: No Respiratory Status: Room air History of Recent Intubation: No Behavior/Cognition: Alert;Cooperative;Pleasant mood Oral Cavity Assessment: Within Functional Limits Oral Care Completed by SLP: Recent completion by staff Oral Cavity - Dentition: Adequate natural dentition Vision: Functional for self-feeding Self-Feeding Abilities: Able to feed self;Other (Comment) (nees assist secondary to left arm weakness) Patient Positioning: Upright in bed Baseline Vocal Quality: Normal Volitional Cough: Strong Volitional Swallow: Able to elicit    Oral/Motor/Sensory Function Overall Oral Motor/Sensory Function: Mild impairment Facial ROM: Reduced left Facial Symmetry: Abnormal symmetry left Facial Sensation: Within Functional Limits Lingual ROM: Within Functional Limits Lingual Symmetry: Within Functional Limits Lingual Strength: Reduced   Ice Chips     Thin Liquid Thin Liquid: Impaired Presentation: Straw Pharyngeal  Phase Impairments: Suspected delayed Swallow;Other (comments) Other Comments: audible swallow    Nectar Thick     Honey Thick     Puree Puree: Within functional limits Presentation: Self Fed;Spoon   Solid     Solid: Not tested  Sonia Baller, MA, CCC-SLP Speech Therapy

## 2022-08-19 NOTE — Assessment & Plan Note (Addendum)
Monitor daily BMP, replete as necessary.

## 2022-08-19 NOTE — Assessment & Plan Note (Addendum)
Likely reactive vs chronic steroid use as cause, AM CBC shows still elevated but downtrending. No infectious or systemic symptoms

## 2022-08-19 NOTE — Assessment & Plan Note (Addendum)
Last TSH wnl 3 weeks ago in [facility]  - Home Synthroid

## 2022-08-19 NOTE — Assessment & Plan Note (Addendum)
?  Chronic prednisone use per husband and has a h/o documented of trial of MTX. No formal RA diagnosis found in chart review. She refills prednisone monthly and has been taking it for years, she gets a rash on upper and lower extremities if stops it.  - Continuing home Prednisone 10 mg - Needs ambulatory referral to rheumatology for formal RA diagnosis and treatment upon discharge   - Will most likely need bisphosphonate upon discharge due to age and chronic steroid use
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
## 2022-08-19 NOTE — ED Notes (Signed)
ED TO INPATIENT HANDOFF REPORT  ED Nurse Name and Phone #: Stanton Kidney, RN  S Name/Age/Gender [name] 81 y.o. female Room/Bed: 039C/039C  Code Status   Code Status: Full Code  Home/SNF/Other Home Patient oriented to: self, place, time, and situation Is this baseline? Yes   Triage Complete: Triage complete  Chief Complaint Acute left-sided weakness [R53.1]  Triage Note Patient bib by [facility] from home. Pt husbands states patient has left sided weakness and left sided facial droop. Started yesterday at 1300 08/18/22. Pt did not want to come in to hospital yesterday and husband did not feel comfortable with pt symptoms today. Code stroke called. Pt arrives to ED with left arm weakness and left sided facial droop. Pt appears to be drowsy and a poor historian.    Allergies Allergies  Allergen Reactions   Cinobac [Cinoxacin] Anaphylaxis    "My throat swelled and I stopped breathing."   Latex Dermatitis    IgE = 14 (WNL) on 04/17/2022 Pt is highly reactive to any latex products. Blisters skin. "Takes my skin off"   Other Shortness Of Breath    Muscle relaxers? Pt thinks allergic to muscle relaxers, reports stops breathing but not sure what medicine and if for sure muscle relaxer   Petrolatum Distillates [Petroleum Distillate] Shortness Of Breath    Passes out    Sulfa Antibiotics Itching    Level of Care/Admitting Diagnosis ED Disposition     ED Disposition  Admit   Condition  --   Comment  Hospital Area: [place] [[id number]]  Level of Care: Telemetry Medical [104]  May place patient in observation at [facility] or [facility] if equivalent level of care is available:: Yes  Covid Evaluation: Asymptomatic - no recent exposure (last 10 days) testing not required  Diagnosis: Acute left-sided weakness [681157]  Admitting Physician: Erskine Emery [[id number]]  Attending Physician: Dickie La [[id number]]          B Medical/Surgery History Past Medical  History:  Diagnosis Date   Anemia    Arthritis    Asthma    uses inhaler just prior to surgery to avoid attack   Back pain    from previous injury   Complication of anesthesia    has woken  up during 2 different surgery   Depression    no current issue/treatment; situation   Gallstones    GERD (gastroesophageal reflux disease)    Hiatal hernia    patient does NOT have nerve/muscle disease   History of kidney stones    HLD (hyperlipidemia)    HTN (hypertension)    Hypothyroidism    Kidney stones    Knee pain    Non-diabetic pancreatic hormone dysfunction years   pt. 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   Pancreatitis    Pneumonia    Seizures ([place])    caused by dye injected during a procedure   Shortness of breath    with exertion   Sinus problem    frequent infections/congestion   Stroke ([place]) 2021   reports having CVA in 2021 and having mini strokes before that   Thyroid disease    Past Surgical History:  Procedure Laterality Date   ABDOMINAL HYSTERECTOMY     APPENDECTOMY     CARPAL TUNNEL RELEASE  10+ years ago   bilateral   EYE SURGERY  3 yrs ago   bilateral cataracts   FOOT OSTEOTOMY  6 weeks ago   Left foot: great, 2nd &  3rd   FOOT OSTEOTOMY  5 years ago   Right great toe   HAND SURGERY Bilateral 2011-most recent   multiple hand surgeries, 2 on left, 3 on right   KNEE ARTHROPLASTY Right 04/28/2022   Procedure: COMPUTER ASSISTED TOTAL KNEE ARTHROPLASTY;  Surgeon: Dereck Leep, MD;  Location: [facility];  Service: Orthopedics;  Laterality: Right;   LOOP RECORDER INSERTION N/A 05/16/2020   Procedure: LOOP RECORDER INSERTION;  Surgeon: Isaias Cowman, MD;  Location: [facility];  Service: Cardiovascular;  Laterality: N/A;   NASAL SINUS SURGERY  most recent 7-8 yrs ago   7 sinus surgeries    TRIGGER FINGER RELEASE  11/19/2011   Procedure: RELEASE TRIGGER FINGER/A-1 PULLEY;  Surgeon: Wynonia Sours, MD;  Location: [facility];   Service: Orthopedics;  Laterality: Right;  release a-1 pulley right index finger and cyst removal   WRIST GANGLION EXCISION  1980's   right     A IV Location/Drains/Wounds Patient Lines/Drains/Airways Status     Active Line/Drains/Airways     Name Placement date Placement time Site Days   Peripheral IV 08/19/22 18 G Left Antecubital 08/19/22  1311  Antecubital  less than 1   Closed System Drain 1 Right;Lateral Knee Accordion (Hemovac) 10 Fr. 04/28/22  1015  Knee  113   Incision (Closed) 04/28/22 Knee Right 04/28/22  0955  -- 113   Incision (Closed) 04/28/22 Knee Right 04/28/22  0955  -- 113            Intake/Output Last 24 hours No intake or output data in the 24 hours ending 08/19/22 1917  Labs/Imaging Results for orders placed or performed during the hospital encounter of 08/19/22 (from the past 48 hour(s))  CBG monitoring, ED     Status: Abnormal   Collection Time: 08/19/22 12:35 PM  Result Value Ref Range   Glucose-Capillary 123 (H) 70 - 99 mg/dL    Comment: Glucose reference range applies only to samples taken after fasting for at least 8 hours.  Protime-INR     Status: None   Collection Time: 08/19/22 12:37 PM  Result Value Ref Range   Prothrombin Time 13.0 11.4 - 15.2 seconds   INR 1.0 0.8 - 1.2    Comment: (NOTE) INR goal varies based on device and disease states. Performed at [facility], [place] [address], [place], [place] [address]   APTT     Status: Abnormal   Collection Time: 08/19/22 12:37 PM  Result Value Ref Range   aPTT 23 (L) 24 - 36 seconds    Comment: Performed at [facility] [address] [place], [place] [address]  CBC     Status: Abnormal   Collection Time: 08/19/22 12:37 PM  Result Value Ref Range   WBC 16.8 (H) 4.0 - 10.5 K/uL   RBC 4.53 3.87 - 5.11 MIL/uL   Hemoglobin 13.1 12.0 - 15.0 g/dL   HCT 41.2 36.0 - 46.0 %   MCV 90.9 80.0 - 100.0 fL   MCH 28.9 26.0 - 34.0 pg   MCHC 31.8 30.0 - 36.0 g/dL   RDW 15.6 (H) 11.5 -  15.5 %   Platelets 311 150 - 400 K/uL   nRBC 0.0 0.0 - 0.2 %    Comment: Performed at [facility] [address], [place], [place] [address]  Differential     Status: Abnormal   Collection Time: 08/19/22 12:37 PM  Result Value Ref Range  Neutrophils Relative % 71 %   Neutro Abs 12.1 (H) 1.7 - 7.7 K/uL   Lymphocytes Relative 15 %   Lymphs Abs 2.5 0.7 - 4.0 K/uL   Monocytes Relative 11 %   Monocytes Absolute 1.8 (H) 0.1 - 1.0 K/uL   Eosinophils Relative 1 %   Eosinophils Absolute 0.2 0.0 - 0.5 K/uL   Basophils Relative 1 %   Basophils Absolute 0.1 0.0 - 0.1 K/uL   Immature Granulocytes 1 %   Abs Immature Granulocytes 0.12 (H) 0.00 - 0.07 K/uL    Comment: Performed at [facility] [address], [place], [place] [address]  Comprehensive metabolic panel     Status: Abnormal   Collection Time: 08/19/22 12:37 PM  Result Value Ref Range   Sodium 138 135 - 145 mmol/L   Potassium 3.4 (L) 3.5 - 5.1 mmol/L   Chloride 101 98 - 111 mmol/L   CO2 27 22 - 32 mmol/L   Glucose, Bld 122 (H) 70 - 99 mg/dL    Comment: Glucose reference range applies only to samples taken after fasting for at least 8 hours.   BUN 11 8 - 23 mg/dL   Creatinine, Ser 1.00 0.44 - 1.00 mg/dL   Calcium 9.1 8.9 - 10.3 mg/dL   Total Protein 5.9 (L) 6.5 - 8.1 g/dL   Albumin 3.2 (L) 3.5 - 5.0 g/dL   AST 20 15 - 41 U/L   ALT 18 0 - 44 U/L   Alkaline Phosphatase 43 38 - 126 U/L   Total Bilirubin 0.8 0.3 - 1.2 mg/dL   GFR, Estimated 57 (L) >60 mL/min    Comment: (NOTE) Calculated using the CKD-EPI Creatinine Equation (2021)    Anion gap 10 5 - 15    Comment: Performed at [facility], [place] [address] [place], [place] [address]  Ethanol     Status: None   Collection Time: 08/19/22 12:37 PM  Result Value Ref Range   Alcohol, Ethyl (B) <10 <10 mg/dL    Comment: (NOTE) Lowest detectable limit for serum alcohol is 10 mg/dL.  For medical purposes only. Performed at [facility], [place]  [address], [place], [place] [address]   I-stat chem 8, ED     Status: Abnormal   Collection Time: 08/19/22 12:43 PM  Result Value Ref Range   Sodium 136 135 - 145 mmol/L   Potassium 3.6 3.5 - 5.1 mmol/L   Chloride 98 98 - 111 mmol/L   BUN 12 8 - 23 mg/dL   Creatinine, Ser 0.80 0.44 - 1.00 mg/dL   Glucose, Bld 120 (H) 70 - 99 mg/dL    Comment: Glucose reference range applies only to samples taken after fasting for at least 8 hours.   Calcium, Ion 1.11 (L) 1.15 - 1.40 mmol/L   TCO2 30 22 - 32 mmol/L   Hemoglobin 14.6 12.0 - 15.0 g/dL   HCT 43.0 36.0 - 46.0 %  CBG monitoring, ED     Status: Abnormal   Collection Time: 08/19/22  3:38 PM  Result Value Ref Range   Glucose-Capillary 118 (H) 70 - 99 mg/dL    Comment: Glucose reference range applies only to samples taken after fasting for at least 8 hours.   MR BRAIN WO CONTRAST  Result Date: 08/19/2022 CLINICAL DATA:  Stroke follow-up. EXAM: MRI HEAD WITHOUT CONTRAST TECHNIQUE: Multiplanar, multiecho pulse sequences of the brain and surrounding structures were obtained without intravenous contrast. COMPARISON:  CT angio neck 08/19/2022  FINDINGS: Brain: Acute infarct in the right corona radiata. Small acute infarct in the right anterior thalamus. Ventricle size normal. Patchy white matter hyperintensity bilaterally compatible with chronic microvascular ischemia. Chronic infarcts in the thalamus bilaterally. Chronic infarcts in the lateral basal ganglia bilaterally. Negative for hemorrhage or mass. Image quality degraded by motion Vascular: Normal arterial flow voids. Skull and upper cervical spine: No focal skeletal lesion. C1-2 arthropathy with pannus. Sinuses/Orbits: Paranasal sinuses clear. Bilateral cataract extraction Other: None IMPRESSION: Acute infarct in the right corona radiata. Small acute infarct in the right anterior thalamus Chronic microvascular ischemic change in the white matter and thalamus bilaterally. Chronic infarcts in the basal ganglia  bilaterally. Electronically Signed   By: Franchot Gallo M.D.   On: 08/19/2022 18:57   EEG adult  Result Date: 08/19/2022 Lora Havens, MD     08/19/2022  3:06 PM Patient Name: [name] MRN: [id number] Epilepsy Attending: Lora Havens Referring Physician/Provider: Katy Apo, NP Date: 08/19/2022 Duration: 28.13 mins Patient history: 81 y.o. female arriving [facility]  via [facility] on 08/19/22 with past medical hx of HTN, hypothyroidism, anxiety, seizure, shingles (with chronic pain). Pt became lethargic with left sided weakness, facial droop, and slurred speech. EEG to evaluate for seizure Level of alertness: Awake AEDs during EEG study: None Technical aspects: This EEG study was done with scalp electrodes positioned according to the 10-20 International system of electrode placement. Electrical activity was reviewed with band pass filter of 1-'70Hz'$ , sensitivity of 7 uV/mm, display speed of 78m/sec with a '60Hz'$  notched filter applied as appropriate. EEG data were recorded continuously and digitally stored.  Video monitoring was available and reviewed as appropriate. Description: The posterior dominant rhythm consists of '8Hz'$  activity of moderate voltage (25-35 uV) seen predominantly in posterior head regions, asymmetric ( right<left) and reactive to eye opening and eye closing. EEG showed continuous 3 to 6 Hz theta-delta slowing in right hemisphere. Hyperventilation and photic stimulation were not performed.   ABNORMALITY - Continuous slow, right hemisphere - Background asymmetry, right<left IMPRESSION: This study is suggestive of cortical dysfunction arising from right hemisphere  likely secondary to underlying structural abnormality. No seizures or epileptiform discharges were seen throughout the recording. Priyanka OBarbra Sarks  CT ANGIO HEAD NECK W WO CM (CODE STROKE)  Result Date: 08/19/2022 EXAM: CT ANGIOGRAPHY HEAD AND NECK TECHNIQUE: Multidetector CT imaging of the head and neck was  performed using the standard protocol during bolus administration of intravenous contrast. Multiplanar CT image reconstructions and MIPs were obtained to evaluate the vascular anatomy. Carotid stenosis measurements (when applicable) are obtained utilizing NASCET criteria, using the distal internal carotid diameter as the denominator. RADIATION DOSE REDUCTION: This exam was performed according to the departmental dose-optimization program which includes automated exposure control, adjustment of the mA and/or kV according to patient size and/or use of iterative reconstruction technique. CONTRAST:  761mOMNIPAQUE IOHEXOL 350 MG/ML SOLN COMPARISON:  None Available. FINDINGS: CTA NECK FINDINGS Aortic arch: Great vessel origins are patent without significant stenosis. Right carotid system: No evidence of dissection, stenosis (50% or greater), or occlusion. Left carotid system: No evidence of dissection, stenosis (50% or greater), or occlusion. Vertebral arteries: Codominant. No evidence of dissection, stenosis (50% or greater), or occlusion. Mild narrowing of right vertebral artery at the craniocervical junction due to mass effect from adjacent degenerative bony hypertrophy. Skeleton: Severe Rosai change at the craniocervical junction with rest change involving the dens and posterior calcified pannus posterior to the  dens. No acute fracture. Other neck: No acute findings Upper chest: Biapical pleuroparenchymal scarring. Review of the MIP images confirms the above findings CTA HEAD FINDINGS Anterior circulation: Calcific atherosclerosis of the intracranial ICAs with mild narrowing bilaterally. Bilateral MCAs and ACAs are patent without proximal hemodynamically significant stenosis. Posterior circulation: Bilateral intradural vertebral arteries, basilar artery and bilateral posterior cerebral arteries are patent. Severe proximal P2 PCA stenosis. Venous sinuses: Not well evaluated due to arterial timing. Review of the MIP  images confirms the above findings IMPRESSION: 1. No emergent large vessel occlusion. 2. Severe proximal left P2 PCA stenosis. 3. 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 Preliminary imaging results were communicated on 08/19/2022 at 12: 54 PM to provider Dr. Reeves Forth Via telephone. Electronically Signed   By: Margaretha Sheffield M.D.   On: 08/19/2022 13:13   CT HEAD CODE STROKE WO CONTRAST  Result Date: 08/19/2022 CLINICAL DATA:  Code stroke.  Neuro deficit, acute, stroke suspected EXAM: CT HEAD WITHOUT CONTRAST TECHNIQUE: Contiguous axial images were obtained from the base of the skull through the vertex without intravenous contrast. RADIATION DOSE REDUCTION: This exam was performed according to the departmental dose-optimization program which includes automated exposure control, adjustment of the mA and/or kV according to patient size and/or use of iterative reconstruction technique. COMPARISON:  MRI head 03/23/2020. FINDINGS: Motion limited. Failed brain: No evidence of acute large vascular territory infarct, acute hemorrhage, mass lesion, midline shift or hydrocephalus. Patchy white matter hypodensities, nonspecific but compatible with chronic microvascular ischemic disease. Vascular: Limited assessment due to motion without obvious hyperdense vessel. Skull: No acute fracture. Sinuses/Orbits: No acute findings. ASPECTS [facility] Stroke Program Early CT Score) Total score (0-10 with 10 being normal): 10. IMPRESSION: Motion limited assessment without obvious acute intracranial abnormality. MRI could provide more sensitive evaluation for acute infarct. Code stroke imaging results were communicated on 08/19/2022 at 12:54 Pm to provider Dr. Reeves Forth Via telephone, who verbally acknowledged these results. Electronically Signed   By: Margaretha Sheffield M.D.   On: 08/19/2022 13:03    Pending Labs Unresulted Labs (From admission, onward)     Start     Ordered    08/20/22 0500  Lipid panel  Tomorrow morning,   R        08/19/22 1311   08/20/22 0500  Hemoglobin A1c  Tomorrow morning,   R        08/19/22 1311   08/20/22 7017  Basic metabolic panel  Tomorrow morning,   R        08/19/22 1848   08/20/22 0500  CBC  Tomorrow morning,   R        08/19/22 1848            Vitals/Pain Today's Vitals   08/19/22 1500 08/19/22 1540 08/19/22 1700 08/19/22 1917  BP: (!) 196/70 (!) 174/80 (!) 184/79   Pulse: 80 85 88   Resp: (!) 22 20 (!) 25   Temp:    98.4 F (36.9 C)  TempSrc:    Oral  SpO2: 97% 94% 93%   Weight:      Height:      PainSc:        Isolation Precautions No active isolations  Medications Medications  labetalol (NORMODYNE) injection 20 mg (has no administration in time range)  hydrALAZINE (APRESOLINE) injection 10 mg (has no administration in time range)  0.9 %  sodium chloride infusion ( Intravenous New Bag/Given 08/19/22 1553)  albuterol (PROVENTIL) (  2.5 MG/3ML) 0.083% nebulizer solution 2.5 mg (has no administration in time range)  DULoxetine (CYMBALTA) DR capsule 30 mg (has no administration in time range)  folic acid (FOLVITE) tablet 1 mg (has no administration in time range)  gabapentin (NEURONTIN) capsule 300 mg (has no administration in time range)  levothyroxine (SYNTHROID) tablet 50 mcg (has no administration in time range)  pantoprazole (PROTONIX) EC tablet 40 mg (has no administration in time range)  hydrOXYzine (ATARAX) tablet 25 mg (has no administration in time range)  sodium chloride flush (NS) 0.9 % injection 3 mL (3 mLs Intravenous Given 08/19/22 1315)  iohexol (OMNIPAQUE) 350 MG/ML injection 75 mL (75 mLs Intravenous Contrast Given 08/19/22 1253)  LORazepam (ATIVAN) injection 1 mg (1 mg Intravenous Given 08/19/22 1803)  labetalol (NORMODYNE) injection 20 mg (20 mg Intravenous Given 08/19/22 1250)  ketorolac (TORADOL) 15 MG/ML injection 15 mg (15 mg Intravenous Given 08/19/22 1418)  metoCLOPramide (REGLAN)  injection 5 mg (5 mg Intravenous Given 08/19/22 1419)    Mobility walks with person assist Moderate fall risk   Focused Assessments Neuro Assessment Handoff:  Swallow screen pass? Yes    NIH Stroke Scale ( + Modified Stroke Scale Criteria)  Interval: Initial Level of Consciousness (1a.)   : Alert, keenly responsive LOC Questions (1b. )   +: Answers both questions correctly LOC Commands (1c. )   + : Performs both tasks correctly Best Gaze (2. )  +: Normal Visual (3. )  +: Complete hemianopia Facial Palsy (4. )    : Minor paralysis Motor Arm, Left (5a. )   +: Drift Motor Arm, Right (5b. )   +: No drift Motor Leg, Left (6a. )   +: Drift Motor Leg, Right (6b. )   +: No drift Limb Ataxia (7. ): Absent Sensory (8. )   +: Normal, no sensory loss Best Language (9. )   +: Mild-to-moderate aphasia Dysarthria (10. ): Mild-to-moderate dysarthria, patient slurs at least some words and, at worst, can be understood with some difficulty Extinction/Inattention (11.)   +: No Abnormality Modified SS Total  +: 5 Complete NIHSS TOTAL: 8 Last date known well: 08/18/22 Last time known well: 1300 Neuro Assessment: Exceptions to WDL Neuro Checks:   Initial (08/19/22 1300)  Last Documented NIHSS Modified Score: 5 (08/19/22 1500) Has TPA been given? No If patient is a Neuro Trauma and patient is going to OR before floor call report to [place] nurse: [phone] or [phone]   R Recommendations: See Admitting Provider Note  Report given to:   Additional Notes: pt is on RA. Pt has on purewick.

## 2022-08-19 NOTE — Progress Notes (Signed)
EEG complete - results pending 

## 2022-08-19 NOTE — Assessment & Plan Note (Addendum)
On home Cymbalta 30 mg.

## 2022-08-19 NOTE — ED Triage Notes (Signed)
Patient bib by [facility] from home. Pt husbands states patient has left sided weakness and left sided facial droop. Started yesterday at 1300 08/18/22. Pt did not want to come in to hospital yesterday and husband did not feel comfortable with pt symptoms today. Code stroke called. Pt arrives to ED with left arm weakness and left sided facial droop. Pt appears to be drowsy and a poor historian.

## 2022-08-19 NOTE — Assessment & Plan Note (Addendum)
Stable, slowly improving. Neuro signed off - Atorvastatin '40mg'$  daily  - DAPT with aspirin 81 and Plavix 75 mg for 3 weeks  - Med-tele cardiac monitoring

## 2022-08-20 ENCOUNTER — Observation Stay (HOSPITAL_COMMUNITY): Payer: Medicare HMO

## 2022-08-20 ENCOUNTER — Inpatient Hospital Stay: Admission: RE | Admit: 2022-08-20 | Payer: Medicare HMO | Source: Ambulatory Visit

## 2022-08-20 DIAGNOSIS — I6389 Other cerebral infarction: Secondary | ICD-10-CM

## 2022-08-20 DIAGNOSIS — E039 Hypothyroidism, unspecified: Secondary | ICD-10-CM | POA: Diagnosis present

## 2022-08-20 DIAGNOSIS — R29810 Facial weakness: Secondary | ICD-10-CM | POA: Diagnosis present

## 2022-08-20 DIAGNOSIS — I639 Cerebral infarction, unspecified: Secondary | ICD-10-CM

## 2022-08-20 DIAGNOSIS — F419 Anxiety disorder, unspecified: Secondary | ICD-10-CM

## 2022-08-20 DIAGNOSIS — I69391 Dysphagia following cerebral infarction: Secondary | ICD-10-CM | POA: Diagnosis not present

## 2022-08-20 DIAGNOSIS — I161 Hypertensive emergency: Secondary | ICD-10-CM | POA: Diagnosis present

## 2022-08-20 DIAGNOSIS — R471 Dysarthria and anarthria: Secondary | ICD-10-CM | POA: Diagnosis present

## 2022-08-20 DIAGNOSIS — Z7982 Long term (current) use of aspirin: Secondary | ICD-10-CM | POA: Diagnosis not present

## 2022-08-20 DIAGNOSIS — Z8673 Personal history of transient ischemic attack (TIA), and cerebral infarction without residual deficits: Secondary | ICD-10-CM | POA: Diagnosis not present

## 2022-08-20 DIAGNOSIS — R4701 Aphasia: Secondary | ICD-10-CM | POA: Diagnosis present

## 2022-08-20 DIAGNOSIS — Z66 Do not resuscitate: Secondary | ICD-10-CM | POA: Diagnosis present

## 2022-08-20 DIAGNOSIS — F32A Depression, unspecified: Secondary | ICD-10-CM | POA: Diagnosis present

## 2022-08-20 DIAGNOSIS — E785 Hyperlipidemia, unspecified: Secondary | ICD-10-CM | POA: Diagnosis present

## 2022-08-20 DIAGNOSIS — I69354 Hemiplegia and hemiparesis following cerebral infarction affecting left non-dominant side: Secondary | ICD-10-CM | POA: Diagnosis not present

## 2022-08-20 DIAGNOSIS — M1712 Unilateral primary osteoarthritis, left knee: Secondary | ICD-10-CM | POA: Diagnosis not present

## 2022-08-20 DIAGNOSIS — J45909 Unspecified asthma, uncomplicated: Secondary | ICD-10-CM | POA: Diagnosis present

## 2022-08-20 DIAGNOSIS — R29708 NIHSS score 8: Secondary | ICD-10-CM | POA: Diagnosis present

## 2022-08-20 DIAGNOSIS — K219 Gastro-esophageal reflux disease without esophagitis: Secondary | ICD-10-CM | POA: Diagnosis present

## 2022-08-20 DIAGNOSIS — B0229 Other postherpetic nervous system involvement: Secondary | ICD-10-CM | POA: Diagnosis present

## 2022-08-20 DIAGNOSIS — G2581 Restless legs syndrome: Secondary | ICD-10-CM | POA: Diagnosis present

## 2022-08-20 DIAGNOSIS — I6381 Other cerebral infarction due to occlusion or stenosis of small artery: Secondary | ICD-10-CM | POA: Diagnosis present

## 2022-08-20 DIAGNOSIS — G8194 Hemiplegia, unspecified affecting left nondominant side: Secondary | ICD-10-CM | POA: Diagnosis present

## 2022-08-20 DIAGNOSIS — Z888 Allergy status to other drugs, medicaments and biological substances status: Secondary | ICD-10-CM | POA: Diagnosis not present

## 2022-08-20 DIAGNOSIS — E876 Hypokalemia: Secondary | ICD-10-CM | POA: Diagnosis present

## 2022-08-20 DIAGNOSIS — Z8249 Family history of ischemic heart disease and other diseases of the circulatory system: Secondary | ICD-10-CM | POA: Diagnosis not present

## 2022-08-20 DIAGNOSIS — R531 Weakness: Secondary | ICD-10-CM | POA: Diagnosis present

## 2022-08-20 DIAGNOSIS — Z882 Allergy status to sulfonamides status: Secondary | ICD-10-CM | POA: Diagnosis not present

## 2022-08-20 DIAGNOSIS — I10 Essential (primary) hypertension: Secondary | ICD-10-CM | POA: Diagnosis present

## 2022-08-20 DIAGNOSIS — R451 Restlessness and agitation: Secondary | ICD-10-CM

## 2022-08-20 DIAGNOSIS — Z79899 Other long term (current) drug therapy: Secondary | ICD-10-CM | POA: Diagnosis not present

## 2022-08-20 LAB — HEMOGLOBIN A1C
Hgb A1c MFr Bld: 5.7 % — ABNORMAL HIGH (ref 4.8–5.6)
Mean Plasma Glucose: 116.89 mg/dL

## 2022-08-20 LAB — BASIC METABOLIC PANEL
Anion gap: 13 (ref 5–15)
BUN: 7 mg/dL — ABNORMAL LOW (ref 8–23)
CO2: 25 mmol/L (ref 22–32)
Calcium: 8.8 mg/dL — ABNORMAL LOW (ref 8.9–10.3)
Chloride: 97 mmol/L — ABNORMAL LOW (ref 98–111)
Creatinine, Ser: 0.83 mg/dL (ref 0.44–1.00)
GFR, Estimated: >60 mL/min (ref >60)
Glucose, Bld: 122 mg/dL — ABNORMAL HIGH (ref 70–99)
Potassium: 3.4 mmol/L — ABNORMAL LOW (ref 3.5–5.1)
Sodium: 135 mmol/L (ref 135–145)

## 2022-08-20 LAB — ECHOCARDIOGRAM COMPLETE
AR max vel: 2.07 cm2
AV Area VTI: 2.07 cm2
AV Area mean vel: 2.07 cm2
AV Mean grad: 3 mmHg
AV Peak grad: 5.6 mmHg
Ao pk vel: 1.18 m/s
Area-P 1/2: 5.02 cm2
Height: 59 in
S' Lateral: 2.7 cm
Weight: 1872 oz

## 2022-08-20 LAB — CBC
HCT: 39.2 % (ref 36.0–46.0)
Hemoglobin: 13 g/dL (ref 12.0–15.0)
MCH: 29.2 pg (ref 26.0–34.0)
MCHC: 33.2 g/dL (ref 30.0–36.0)
MCV: 88.1 fL (ref 80.0–100.0)
Platelets: 316 10*3/uL (ref 150–400)
RBC: 4.45 MIL/uL (ref 3.87–5.11)
RDW: 15.3 % (ref 11.5–15.5)
WBC: 15.1 10*3/uL — ABNORMAL HIGH (ref 4.0–10.5)
nRBC: 0 % (ref 0.0–0.2)

## 2022-08-20 LAB — LIPID PANEL
Cholesterol: 231 mg/dL — ABNORMAL HIGH (ref 0–200)
HDL: 64 mg/dL (ref >40)
LDL Cholesterol: 95 mg/dL (ref 0–99)
Total CHOL/HDL Ratio: 3.6 RATIO
Triglycerides: 361 mg/dL — ABNORMAL HIGH (ref <150)
VLDL: 72 mg/dL — ABNORMAL HIGH (ref 0–40)

## 2022-08-20 MED ORDER — ASPIRIN 81 MG PO CHEW
81.0000 mg | CHEWABLE_TABLET | Freq: Every day | ORAL | Status: DC
  Administered 2022-08-20 – 2022-08-21 (×2): 81 mg via ORAL
  Filled 2022-08-20 (×2): qty 1

## 2022-08-20 MED ORDER — ACETAMINOPHEN 500 MG PO TABS
1000.0000 mg | ORAL_TABLET | Freq: Four times a day (QID) | ORAL | Status: DC | PRN
  Administered 2022-08-20 – 2022-08-21 (×2): 1000 mg via ORAL
  Filled 2022-08-20 (×3): qty 2

## 2022-08-20 MED ORDER — ONDANSETRON 4 MG PO TBDP
8.0000 mg | ORAL_TABLET | Freq: Three times a day (TID) | ORAL | Status: DC | PRN
  Filled 2022-08-20 (×2): qty 2

## 2022-08-20 MED ORDER — POTASSIUM CHLORIDE 20 MEQ PO PACK
40.0000 meq | PACK | Freq: Once | ORAL | Status: AC
  Administered 2022-08-20: 40 meq via ORAL
  Filled 2022-08-20: qty 2

## 2022-08-20 MED ORDER — ENOXAPARIN SODIUM 40 MG/0.4ML IJ SOSY
40.0000 mg | PREFILLED_SYRINGE | INTRAMUSCULAR | Status: DC
  Administered 2022-08-20 – 2022-08-21 (×2): 40 mg via SUBCUTANEOUS
  Filled 2022-08-20 (×2): qty 0.4

## 2022-08-20 MED ORDER — PREDNISONE 5 MG PO TABS
10.0000 mg | ORAL_TABLET | Freq: Every day | ORAL | Status: DC
  Administered 2022-08-20 – 2022-08-22 (×3): 10 mg via ORAL
  Filled 2022-08-20 (×3): qty 2

## 2022-08-20 MED ORDER — ATORVASTATIN CALCIUM 40 MG PO TABS
40.0000 mg | ORAL_TABLET | Freq: Every day | ORAL | Status: DC
  Administered 2022-08-20 – 2022-08-22 (×3): 40 mg via ORAL
  Filled 2022-08-20 (×3): qty 1

## 2022-08-20 MED ORDER — LOSARTAN POTASSIUM 50 MG PO TABS
50.0000 mg | ORAL_TABLET | Freq: Every day | ORAL | Status: DC
  Administered 2022-08-20 – 2022-08-21 (×2): 50 mg via ORAL
  Filled 2022-08-20 (×2): qty 1

## 2022-08-20 MED ORDER — CLOPIDOGREL BISULFATE 75 MG PO TABS
75.0000 mg | ORAL_TABLET | Freq: Every day | ORAL | Status: DC
  Administered 2022-08-20 – 2022-08-22 (×3): 75 mg via ORAL
  Filled 2022-08-20 (×3): qty 1

## 2022-08-20 MED ORDER — PROPRANOLOL HCL ER 60 MG PO CP24
60.0000 mg | ORAL_CAPSULE | Freq: Every day | ORAL | Status: DC
  Administered 2022-08-21: 60 mg via ORAL
  Filled 2022-08-20 (×2): qty 1

## 2022-08-20 NOTE — Progress Notes (Signed)
Inpatient Rehab Admissions Coordinator:  ° °Per therapy recommendation,  patient was screened for CIR candidacy by Tashara Suder, MS, CCC-SLP. At this time, Pt. Appears to be a a potential candidate for CIR. I will place   order for rehab consult per protocol for full assessment. Please contact me any with questions. ° °Dimitrious Micciche, MS, CCC-SLP °Rehab Admissions Coordinator  °[phone] (celll) °[phone] (office) ° °

## 2022-08-20 NOTE — Progress Notes (Signed)
PT Cancellation Note  Patient Details Name: [name] MRN: [id number] DOB: 1941/10/22   Cancelled Treatment:    Reason Eval/Treat Not Completed: Fatigue/lethargy limiting ability to participate. Will check back as schedule allows to re-attempt PT eval.   Thelma Comp 08/20/2022, 9:30 AM  Rolinda Roan, PT, DPT Acute Rehabilitation Services Secure Chat Preferred Office: [phone]

## 2022-08-20 NOTE — Assessment & Plan Note (Addendum)
On first night, team was called for patient agitation and pulling at lines. She gets claustrophobic and hot overnight.   - Keep room cool overnight  - Keep mitts in place

## 2022-08-20 NOTE — Progress Notes (Addendum)
STROKE TEAM PROGRESS NOTE   INTERVAL HISTORY Her husband is at the bedside.  She is laying in the bed sleeping in NAD. She awakens easily. She is alert and oriented x4, She is following commands. Left facial droop with mild dysarthria. No aphasia. EOM intact, visual fields full, tracks, Left arm is flaccid. Left leg is antigravity 3/5. Right arm and right leg is 5/5. Sensation is intact. No ataxia.   Vitals:   08/19/22 2038 08/20/22 0113 08/20/22 0419 08/20/22 0742  BP: (!) 160/82 (!) 197/89 (!) 179/97 (!) 195/88  Pulse: 98 96 (!) 109 86  Resp: '18 20 20 18  '$ Temp: 98.6 F (37 C) 98.1 F (36.7 C) 97.8 F (36.6 C) 98.2 F (36.8 C)  TempSrc: Oral Oral Oral   SpO2: 95% 97% 99% 96%  Weight:      Height:       CBC:  Recent Labs  Lab 08/19/22 1237 08/19/22 1243 08/20/22 0338  WBC 16.8*  --  15.1*  NEUTROABS 12.1*  --   --   HGB 13.1 14.6 13.0  HCT 41.2 43.0 39.2  MCV 90.9  --  88.1  PLT 311  --  161   Basic Metabolic Panel:  Recent Labs  Lab 08/19/22 1237 08/19/22 1243 08/20/22 0338  NA 138 136 135  K 3.4* 3.6 3.4*  CL 101 98 97*  CO2 27  --  25  GLUCOSE 122* 120* 122*  BUN 11 12 7*  CREATININE 1.00 0.80 0.83  CALCIUM 9.1  --  8.8*   Lipid Panel:  Recent Labs  Lab 08/20/22 0338  CHOL 231*  TRIG 361*  HDL 64  CHOLHDL 3.6  VLDL 72*  LDLCALC 95   HgbA1c:  Recent Labs  Lab 08/20/22 0338  HGBA1C 5.7*   Urine Drug Screen: No results for input(s): "LABOPIA", "COCAINSCRNUR", "LABBENZ", "AMPHETMU", "THCU", "LABBARB" in the last 168 hours.  Alcohol Level  Recent Labs  Lab 08/19/22 1237  ETH <10    IMAGING past 24 hours MR BRAIN WO CONTRAST  Result Date: 08/19/2022 CLINICAL DATA:  Stroke follow-up. EXAM: MRI HEAD WITHOUT CONTRAST TECHNIQUE: Multiplanar, multiecho pulse sequences of the brain and surrounding structures were obtained without intravenous contrast. COMPARISON:  CT angio neck 08/19/2022 FINDINGS: Brain: Acute infarct in the right corona radiata.  Small acute infarct in the right anterior thalamus. Ventricle size normal. Patchy white matter hyperintensity bilaterally compatible with chronic microvascular ischemia. Chronic infarcts in the thalamus bilaterally. Chronic infarcts in the lateral basal ganglia bilaterally. Negative for hemorrhage or mass. Image quality degraded by motion Vascular: Normal arterial flow voids. Skull and upper cervical spine: No focal skeletal lesion. C1-2 arthropathy with pannus. Sinuses/Orbits: Paranasal sinuses clear. Bilateral cataract extraction Other: None IMPRESSION: Acute infarct in the right corona radiata. Small acute infarct in the right anterior thalamus Chronic microvascular ischemic change in the white matter and thalamus bilaterally. Chronic infarcts in the basal ganglia bilaterally. Electronically Signed   By: Franchot Gallo M.D.   On: 08/19/2022 18:57   EEG adult  Result Date: 08/19/2022 Lora Havens, MD     08/19/2022  3:06 PM Patient Name: [name] MRN: [id number] Epilepsy Attending: Lora Havens Referring Physician/Provider: Katy Apo, NP Date: 08/19/2022 Duration: 28.13 mins Patient history: 81 y.o. female arriving to [facility]  via [facility] on 08/19/22 with past medical hx of HTN, hypothyroidism, anxiety, seizure, shingles (with chronic pain). Pt became lethargic with left sided weakness, facial droop, and slurred  speech. EEG to evaluate for seizure Level of alertness: Awake AEDs during EEG study: None Technical aspects: This EEG study was done with scalp electrodes positioned according to the 10-20 International system of electrode placement. Electrical activity was reviewed with band pass filter of 1-'70Hz'$ , sensitivity of 7 uV/mm, display speed of 35m/sec with a '60Hz'$  notched filter applied as appropriate. EEG data were recorded continuously and digitally stored.  Video monitoring was available and reviewed as appropriate. Description: The posterior dominant rhythm consists of '8Hz'$   activity of moderate voltage (25-35 uV) seen predominantly in posterior head regions, asymmetric ( right<left) and reactive to eye opening and eye closing. EEG showed continuous 3 to 6 Hz theta-delta slowing in right hemisphere. Hyperventilation and photic stimulation were not performed.   ABNORMALITY - Continuous slow, right hemisphere - Background asymmetry, right<left IMPRESSION: This study is suggestive of cortical dysfunction arising from right hemisphere  likely secondary to underlying structural abnormality. No seizures or epileptiform discharges were seen throughout the recording. Priyanka OBarbra Sarks  CT ANGIO HEAD NECK W WO CM (CODE STROKE)  Result Date: 08/19/2022 EXAM: CT ANGIOGRAPHY HEAD AND NECK TECHNIQUE: Multidetector CT imaging of the head and neck was performed using the standard protocol during bolus administration of intravenous contrast. Multiplanar CT image reconstructions and MIPs were obtained to evaluate the vascular anatomy. Carotid stenosis measurements (when applicable) are obtained utilizing NASCET criteria, using the distal internal carotid diameter as the denominator. RADIATION DOSE REDUCTION: This exam was performed according to the departmental dose-optimization program which includes automated exposure control, adjustment of the mA and/or kV according to patient size and/or use of iterative reconstruction technique. CONTRAST:  731mOMNIPAQUE IOHEXOL 350 MG/ML SOLN COMPARISON:  None Available. FINDINGS: CTA NECK FINDINGS Aortic arch: Great vessel origins are patent without significant stenosis. Right carotid system: No evidence of dissection, stenosis (50% or greater), or occlusion. Left carotid system: No evidence of dissection, stenosis (50% or greater), or occlusion. Vertebral arteries: Codominant. No evidence of dissection, stenosis (50% or greater), or occlusion. Mild narrowing of right vertebral artery at the craniocervical junction due to mass effect from adjacent  degenerative bony hypertrophy. Skeleton: Severe Rosai change at the craniocervical junction with rest change involving the dens and posterior calcified pannus posterior to the dens. No acute fracture. Other neck: No acute findings Upper chest: Biapical pleuroparenchymal scarring. Review of the MIP images confirms the above findings CTA HEAD FINDINGS Anterior circulation: Calcific atherosclerosis of the intracranial ICAs with mild narrowing bilaterally. Bilateral MCAs and ACAs are patent without proximal hemodynamically significant stenosis. Posterior circulation: Bilateral intradural vertebral arteries, basilar artery and bilateral posterior cerebral arteries are patent. Severe proximal P2 PCA stenosis. Venous sinuses: Not well evaluated due to arterial timing. Review of the MIP images confirms the above findings IMPRESSION: 1. No emergent large vessel occlusion. 2. Severe proximal left P2 PCA stenosis. 3. 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 Preliminary imaging results were communicated on 08/19/2022 at 12: 54 PM to provider Dr. PaReeves Forthia telephone. Electronically Signed   By: FrMargaretha Sheffield.D.   On: 08/19/2022 13:13   CT HEAD CODE STROKE WO CONTRAST  Result Date: 08/19/2022 CLINICAL DATA:  Code stroke.  Neuro deficit, acute, stroke suspected EXAM: CT HEAD WITHOUT CONTRAST TECHNIQUE: Contiguous axial images were obtained from the base of the skull through the vertex without intravenous contrast. RADIATION DOSE REDUCTION: This exam was performed according to the departmental dose-optimization program which includes automated exposure control, adjustment  of the mA and/or kV according to patient size and/or use of iterative reconstruction technique. COMPARISON:  MRI head 03/23/2020. FINDINGS: Motion limited. Failed brain: No evidence of acute large vascular territory infarct, acute hemorrhage, mass lesion, midline shift or hydrocephalus. Patchy  white matter hypodensities, nonspecific but compatible with chronic microvascular ischemic disease. Vascular: Limited assessment due to motion without obvious hyperdense vessel. Skull: No acute fracture. Sinuses/Orbits: No acute findings. ASPECTS [facility] Stroke Program Early CT Score) Total score (0-10 with 10 being normal): 10. IMPRESSION: Motion limited assessment without obvious acute intracranial abnormality. MRI could provide more sensitive evaluation for acute infarct. Code stroke imaging results were communicated on 08/19/2022 at 12:54 Pm to provider Dr. Reeves Forth Via telephone, who verbally acknowledged these results. Electronically Signed   By: Margaretha Sheffield M.D.   On: 08/19/2022 13:03    PHYSICAL EXAM  Temp:  [97.8 F (36.6 C)-98.6 F (37 C)] 98.2 F (36.8 C) (09/13 0742) Pulse Rate:  [80-109] 86 (09/13 0742) Resp:  [18-25] 18 (09/13 0742) BP: (160-197)/(70-97) 195/88 (09/13 0742) SpO2:  [92 %-99 %] 96 % (09/13 0742)  General - Well nourished, well developed, in no apparent distress. Cardiovascular - Regular rhythm and rate.  Mental Status -  Level of arousal and orientation to time, place, and person were intact. Mild dysarthria, no aphasia follows commands  Language including expression, naming, repetition, comprehension was assessed and found intact. Attention span and concentration were normal. Recent and remote memory were intact. Fund of Knowledge was assessed and was intact.  Cranial Nerves II - XII - II - Visual field intact OU. III, IV, VI - Extraocular movements intact. V - Facial sensation intact bilaterally. VII - left facial droop VIII - Hearing & vestibular intact bilaterally. X - Palate elevates symmetrically. XI - Chin turning & shoulder shrug intact bilaterally. XII - Tongue protrusion intact.  Motor Strength - Left arm flaccid, left leg 3/5 can lift off bed. Right arm and Right leg 5/5 Motor Tone - Muscle tone was assessed at the neck and appendages and  was normal.  Sensory - decreased left side   Coordination - The patient had normal movements in the hands and feet with no ataxia or dysmetria.  Tremor on left arm  Gait and Station - deferred.   ASSESSMENT/PLAN [name] is a 81 y.o. female with history of knee replacement, seizure, HTN, hypothyroidism, shingles, headaches and anxiety who presents with left arm weakness, left visual field cut, dysarthria, nausea and vomiting.  Patient's symptoms began at 1330 yesterday, and she initially refused to be seen for them.    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 ASPECTS 10  CTA head & neck  1. No emergent large vessel occlusion. 2. Severe proximal left P2 PCA stenosis. 3. 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 Small acute infarct in the right anterior thalamus. Chronic microvascular ischemic change in the white matter and thalamus bilaterally. Chronic infarcts in the basal ganglia bilaterally.  2D Echo 55-60%  LDL 95 HgbA1c 5.7 VTE prophylaxis - SCD's    Diet   DIET DYS 3 Room service appropriate? Yes; Fluid consistency: Thin   No antithrombotic prior to admission, now on aspirin 81 mg daily and clopidogrel 75 mg daily. For 3  weeks than ASA alone  Therapy recommendations:  CIR Disposition:  pending  Hypertension Home meds:  cozaar, propanolol Stable Permissive hypertension (OK if < 220/120) but gradually normalize in 5-7 days Long-term BP goal normotensive  Hyperlipidemia Home meds:  none LDL 95, goal < 70 Add atorvastatin '40mg'$    Continue statin at discharge   Other Stroke Risk Factors Advanced Age >/= 61  Hx stroke/TIA Migraines   Other Active Problems Hypothyroidism GERD Anxiety and  depression  Hospital day # 0  Beulah Gandy DNP [id number]   ATTENDING ATTESTATION:  81 year old with right thalamic corona radiata CVA.  CT is negative.  Echo showed 65 to 60% ejection fraction, no PFO.  Recommend DAPT therapy for 3 weeks.  EEG is unremarkable.  Continue physical therapy and Occupational Therapy.  Acute inpatient rehab is recommended.  Recommend 30-day monitor on discharge.  Neurology will sign off.  Please call with questions. patient should follow-up in stroke clinic after discharge  Dr. Reeves Forth evaluated pt independently, reviewed imaging, chart, labs. Discussed and formulated plan with the Resident/APP. Changes were made to the note where appropriate. Please see APP/resident note above for details.     Olar Santini,MD   To contact Stroke Continuity provider, please refer to [email or web address] After hours, contact General Neurology

## 2022-08-20 NOTE — Evaluation (Signed)
Physical Therapy Evaluation  Patient Details Name: [name] MRN: [id number] DOB: Mar 28, 1941 Today's Date: 08/20/2022  History of Present Illness  Pt is a 81 y/o female admitted 9/12 with L sided weakness and aphasia.  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   Clinical Impression  Pt admitted with above diagnosis. Pt currently with functional limitations due to the deficits listed below (see PT Problem List). At the time of PT eval pt was able to perform transfers with up to +2 assist for balance support and safety with the RW. Session somewhat limited by lethargy, and then urinary incontinence once up. Husband present and supportive but appears to have decreased understanding of the situation, asking if the pt "would be here another night." Recommending intensive multidisciplinary therapies to maximize functional independence, decrease risk for falls, and decrease burden of care on husband prior to return home. Acutely, pt will benefit from skilled PT to increase their independence and safety with mobility to allow discharge to the venue listed below.          Recommendations for follow up therapy are one component of a multi-disciplinary discharge planning process, led by the attending physician.  Recommendations may be updated based on patient status, additional functional criteria and insurance authorization.  Follow Up Recommendations Acute inpatient rehab (3hours/day)      Assistance Recommended at Discharge Frequent or constant Supervision/Assistance  Patient can return home with the following  A little help with walking and/or transfers;A little help with bathing/dressing/bathroom;Assistance with cooking/housework;Assist for transportation;Help with stairs or ramp for entrance    Equipment Recommendations Rolling walker (2 wheels)  Recommendations for Other Services   Rehab consult    Functional Status Assessment Patient has had a recent decline in their functional status and demonstrates the ability to make significant improvements in function in a reasonable and predictable amount of time.     Precautions / Restrictions Precautions Precautions: Fall Restrictions Weight Bearing Restrictions: No      Mobility  Bed Mobility Overal bed mobility: Needs Assistance Bed Mobility: Rolling, Sidelying to Sit Rolling: Min assist Sidelying to sit: Mod assist       General bed mobility comments: cueing for technique, support for trunk and scooting forward    Transfers Overall transfer level: Needs assistance Equipment used: Rolling walker (2 wheels) Transfers: Sit to/from Stand, Bed to chair/wheelchair/BSC Sit to Stand: Mod assist, Min assist, +2 physical assistance, +2 safety/equipment Stand pivot transfers: Min assist, +2 physical assistance, +2 safety/equipment         General transfer comment: from EOB mod assist +2, min from recliner; cueing for hand placement, safety and technique. preference for L lateral lean and assist for placement/holding RW with L hand.    Ambulation/Gait               General Gait Details: Unable to progress to gait training at this time.  Stairs            Wheelchair Mobility    Modified Rankin (Stroke Patients Only)       Balance Overall balance assessment: Needs assistance Sitting-balance support: Feet supported, Single extremity supported Sitting balance-Leahy Scale: Poor Sitting balance - Comments: lateral L lean, able to correct to midline with cueing. Postural control: Left lateral lean Standing balance support: During functional activity, Single extremity supported, Bilateral upper extremity supported Standing balance-Leahy Scale: Poor Standing balance comment: relies on UE  and external support                             Pertinent Vitals/Pain Pain Assessment Pain  Assessment: Faces Faces Pain Scale: No hurt Pain Location: head and back Pain Descriptors / Indicators: Aching Pain Intervention(s): Limited activity within patient's tolerance, Monitored during session, Repositioned    Home Living Family/patient expects to be discharged to:: Private residence Living Arrangements: Spouse/significant other Available Help at Discharge: Family;Available 24 hours/day Type of Home: House Home Access: Stairs to enter Entrance Stairs-Rails: Right;Left Entrance Stairs-Number of Steps: 7 (3 steps front, no rail)   Home Layout: Two level;Able to live on main level with bedroom/bathroom Home Equipment: Shower seat - built in;Toilet riser;BSC/3in1 Additional Comments: Owns 3WW    Prior Function Prior Level of Function : Independent/Modified Independent             Mobility Comments: using 3WW ADLs Comments: independent ADLs     Hand Dominance   Dominant Hand: Right    Extremity/Trunk Assessment   Upper Extremity Assessment Upper Extremity Assessment: Defer to OT evaluation LUE Deficits / Details: 3-/5MMT shoulder elevation, retraction, 2/5 flexion and trace elbow.  No active movement seen forearm distal.  Hand pulling into flexion, encouraged extension of fingers and will monitor for brace. LUE Sensation: WNL LUE Coordination: decreased fine motor;decreased gross motor    Lower Extremity Assessment Lower Extremity Assessment: LLE deficits/detail LLE Deficits / Details: 4/5 strength grossly in quads, hamstrings, hip flexors    Cervical / Trunk Assessment Cervical / Trunk Assessment: Other exceptions Cervical / Trunk Exceptions: Forward head posture with rounded shoulders  Communication   Communication: Expressive difficulties (dysarthric)  Cognition Arousal/Alertness: Lethargic, Suspect due to medications Behavior During Therapy: Flat affect Overall Cognitive Status: Impaired/Different from baseline Area of Impairment: Attention, Problem  solving, Awareness, Following commands, Safety/judgement                   Current Attention Level: Focused   Following Commands: Follows one step commands consistently, Follows one step commands with increased time, Follows multi-step commands inconsistently Safety/Judgement: Decreased awareness of safety, Decreased awareness of deficits Awareness: Emergent Problem Solving: Slow processing, Difficulty sequencing, Requires verbal cues, Decreased initiation, Requires tactile cues General Comments: pt oriented to time, follows simple commands with increased time but lethargic throughout session and requires cueing to maintain alertness.  Anticipate from medication.  Continue to assess.        General Comments General comments (skin integrity, edema, etc.): spouse present and supportive.  Educated on protection/positioning of LUE.    Exercises     Assessment/Plan    PT Assessment Patient needs continued PT services  PT Problem List Decreased strength;Decreased range of motion;Decreased activity tolerance;Decreased balance;Decreased mobility;Decreased knowledge of use of DME;Decreased safety awareness;Decreased knowledge of precautions;Decreased cognition;Decreased coordination       PT Treatment Interventions DME instruction;Gait training;Stair training;Functional mobility training;Therapeutic activities;Therapeutic exercise;Balance training;Neuromuscular re-education;Cognitive remediation;Patient/family education    PT Goals (Current goals can be found in the Care Plan section)  Acute Rehab PT Goals Patient Stated Goal: None stated PT Goal Formulation: With patient/family Time For Goal Achievement: 09/03/22 Potential to Achieve Goals: Good    Frequency Min 2X/week     Co-evaluation   Reason for Co-Treatment: For patient/therapist safety;To address functional/ADL transfers;Necessary to address cognition/behavior during functional activity   OT goals addressed during  session: ADL's and self-care       AM-PAC PT "6 Clicks"  Mobility  Outcome Measure Help needed turning from your back to your side while in a flat bed without using bedrails?: A Lot Help needed moving from lying on your back to sitting on the side of a flat bed without using bedrails?: A Lot Help needed moving to and from a bed to a chair (including a wheelchair)?: Total Help needed standing up from a chair using your arms (e.g., wheelchair or bedside chair)?: Total Help needed to walk in hospital room?: Total Help needed climbing 3-5 steps with a railing? : Total 6 Click Score: 8    End of Session Equipment Utilized During Treatment: Gait belt Activity Tolerance: Patient limited by lethargy Patient left: in chair;with call bell/phone within reach;with chair alarm set;with family/visitor present (Ice packs at sides to keep from getting too hot (husband reports the blue gowns make her sweat)) Nurse Communication: Mobility status PT Visit Diagnosis: Unsteadiness on feet (R26.81)    Time: 6578-4696 PT Time Calculation (min) (ACUTE ONLY): 39 min   Charges:   PT Evaluation $PT Eval Moderate Complexity: 1 Mod          Rolinda Roan, PT, DPT Acute Rehabilitation Services Secure Chat Preferred Office: [phone]   Thelma Comp 08/20/2022, 3:11 PM

## 2022-08-20 NOTE — Evaluation (Signed)
Occupational Therapy Evaluation Patient Details Name: [name] MRN: [id number] DOB: 1940-12-21 Today's Date: 08/20/2022   History of Present Illness Pt is a 81 y/o female admitted 9/12 with L sided weakness and aphasia.  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   Clinical Impression   PTA patient independent with ADLs, mobility using 3WW.  Patient admitted for above and presents with problem list below, including L sided hemiparesis, impaired balance, decreased activity tolerance, generalized weakness, and impaired cognition.  Pt able to follow simple 1 step commands with increased time, but noted decreased awareness to deficits, safety and problem solving.  She completes ADLs with min to total assist, transfers with min-mod +2 using RW (while supporting L hand on RW).  She will benefit from continued OT services acutely and after dc at AIR level to optimize independence, safety and return to PLOF. Will follow.      Recommendations for follow up therapy are one component of a multi-disciplinary discharge planning process, led by the attending physician.  Recommendations may be updated based on patient status, additional functional criteria and insurance authorization.   Follow Up Recommendations  Acute inpatient rehab (3hours/day)    Assistance Recommended at Discharge Frequent or constant Supervision/Assistance  Patient can return home with the following A lot of help with walking and/or transfers;A lot of help with bathing/dressing/bathroom;Assistance with cooking/housework;Assistance with feeding;Direct supervision/assist for medications management;Direct supervision/assist for financial management;Assist for transportation;Help with stairs or ramp for entrance    Functional Status Assessment  Patient has had a recent decline in their functional status and demonstrates the  ability to make significant improvements in function in a reasonable and predictable amount of time.  Equipment Recommendations  Other (comment) (defer)    Recommendations for Other Services Rehab consult     Precautions / Restrictions Precautions Precautions: Fall Restrictions Weight Bearing Restrictions: No      Mobility Bed Mobility Overal bed mobility: Needs Assistance Bed Mobility: Rolling, Sidelying to Sit Rolling: Min assist Sidelying to sit: Mod assist       General bed mobility comments: cueing for technique, support for trunk and scooting forward    Transfers Overall transfer level: Needs assistance Equipment used: Rolling walker (2 wheels) Transfers: Sit to/from Stand, Bed to chair/wheelchair/BSC Sit to Stand: Mod assist, Min assist, +2 physical assistance, +2 safety/equipment Stand pivot transfers: Min assist, +2 physical assistance, +2 safety/equipment         General transfer comment: from EOB mod assist +2, min from recliner; cueing for hand placement, safety and technique. preference for L lateral lean and assist for placement/holding RW with L hand.      Balance Overall balance assessment: Needs assistance Sitting-balance support: Feet supported, Single extremity supported Sitting balance-Leahy Scale: Poor Sitting balance - Comments: lateral L lean, able to correct to midline with cueing. Postural control: Left lateral lean Standing balance support: During functional activity, Single extremity supported, Bilateral upper extremity supported Standing balance-Leahy Scale: Poor Standing balance comment: relies on UE and external support                           ADL either performed or assessed with clinical judgement   ADL Overall ADL's : Needs assistance/impaired     Grooming: Minimal assistance;Sitting           Upper Body Dressing : Maximal assistance;Sitting   Lower  Body Dressing: Total assistance;+2 for physical  assistance;+2 for safety/equipment;Sit to/from stand   Toilet Transfer: Moderate assistance;Minimal assistance;+2 for physical assistance;+2 for safety/equipment;Stand-pivot;Rolling walker (2 wheels)   Toileting- Clothing Manipulation and Hygiene: Total assistance;+2 for physical assistance;+2 for safety/equipment;Sit to/from stand Toileting - Clothing Manipulation Details (indicate cue type and reason): incontient bladder in recliner, total assist for hygiene and changing clothing     Functional mobility during ADLs: Minimal assistance;+2 for safety/equipment;+2 for physical assistance;Moderate assistance       Vision Baseline Vision/History: 1 Wears glasses (reading) Ability to See in Adequate Light: 0 Adequate Patient Visual Report: Blurring of vision Additional Comments: brief assessment completed, able to scan to L and R, locate correct # of fingers. continue to assesss     Perception     Praxis      Pertinent Vitals/Pain Pain Assessment Pain Assessment: Faces Faces Pain Scale: No hurt     Hand Dominance Right   Extremity/Trunk Assessment Upper Extremity Assessment Upper Extremity Assessment: LUE deficits/detail LUE Deficits / Details: 3-/5MMT shoulder elevation, retraction, 2/5 flexion and trace elbow.  No active movement seen forearm distal.  Hand pulling into flexion, encouraged extension of fingers and will monitor for brace. LUE Sensation: WNL LUE Coordination: decreased fine motor;decreased gross motor   Lower Extremity Assessment Lower Extremity Assessment: Defer to PT evaluation       Communication Communication Communication: Expressive difficulties (dysarthric)   Cognition Arousal/Alertness: Lethargic, Suspect due to medications Behavior During Therapy: Flat affect Overall Cognitive Status: Impaired/Different from baseline Area of Impairment: Attention, Problem solving, Awareness, Following commands, Safety/judgement                   Current  Attention Level: Focused   Following Commands: Follows one step commands consistently, Follows one step commands with increased time, Follows multi-step commands inconsistently Safety/Judgement: Decreased awareness of safety, Decreased awareness of deficits Awareness: Emergent Problem Solving: Slow processing, Difficulty sequencing, Requires verbal cues, Decreased initiation, Requires tactile cues General Comments: pt oriented to time, follows simple commands with increased time but lethargic throughout session and requires cueing to maintain alertness.  Anticipate from medication.  Continue to assess.     General Comments  spouse present and supportive.  Educated on protection/positioning of LUE.    Exercises     Shoulder Instructions      Home Living Family/patient expects to be discharged to:: Private residence Living Arrangements: Spouse/significant other Available Help at Discharge: Family;Available 24 hours/day Type of Home: House Home Access: Stairs to enter [employer] of Steps: 7 (3 steps front, no rail) Entrance Stairs-Rails: Right;Left Home Layout: Two level;Able to live on main level with bedroom/bathroom     Bathroom Shower/Tub: Occupational psychologist: Handicapped height     Home Equipment: [place] in;Toilet riser;BSC/3in1   Additional Comments: Owns 3WW      Prior Functioning/Environment Prior Level of Function : Independent/Modified Independent             Mobility Comments: using 3WW ADLs Comments: independent ADLs        OT Problem List: Decreased strength;Decreased range of motion;Decreased activity tolerance;Impaired balance (sitting and/or standing);Impaired vision/perception;Decreased coordination;Decreased cognition;Decreased knowledge of use of DME or AE;Decreased safety awareness;Decreased knowledge of precautions;Impaired sensation;Impaired tone;Impaired UE functional use      OT Treatment/Interventions:  Self-care/ADL training;Therapeutic exercise;DME and/or AE instruction;Therapeutic activities;Cognitive remediation/compensation;Patient/family education;Balance training;Visual/perceptual remediation/compensation;Splinting;Neuromuscular education    OT Goals(Current goals can be found in the care plan section) Acute Rehab OT Goals  Patient Stated Goal: get better OT Goal Formulation: With patient Time For Goal Achievement: 09/03/22 Potential to Achieve Goals: Good  OT Frequency: Min 2X/week    Co-evaluation PT/OT/SLP Co-Evaluation/Treatment: Yes Reason for Co-Treatment: For patient/therapist safety;To address functional/ADL transfers;Necessary to address cognition/behavior during functional activity   OT goals addressed during session: ADL's and self-care      AM-PAC OT "6 Clicks" Daily Activity     Outcome Measure Help from another person eating meals?: A Lot Help from another person taking care of personal grooming?: A Lot Help from another person toileting, which includes using toliet, bedpan, or urinal?: Total Help from another person bathing (including washing, rinsing, drying)?: A Lot Help from another person to put on and taking off regular upper body clothing?: A Lot Help from another person to put on and taking off regular lower body clothing?: Total 6 Click Score: 10   End of Session Equipment Utilized During Treatment: Gait belt;Rolling walker (2 wheels) Nurse Communication: Mobility status  Activity Tolerance: Patient tolerated treatment well Patient left: in chair;with call bell/phone within reach;with chair alarm set;with family/visitor present  OT Visit Diagnosis: Other abnormalities of gait and mobility (R26.89);Muscle weakness (generalized) (M62.81);Hemiplegia and hemiparesis;Other symptoms and signs involving cognitive function Hemiplegia - Right/Left: Left Hemiplegia - dominant/non-dominant: Non-Dominant Hemiplegia - caused by: Cerebral infarction                 Time: 7014-1030 OT Time Calculation (min): 36 min Charges:  OT General Charges $OT Visit: 1 Visit OT Evaluation $OT Eval Moderate Complexity: 1 Mod  Jolaine Artist, OT Acute Rehabilitation Services Office [phone]   Delight Stare 08/20/2022, 2:06 PM

## 2022-08-20 NOTE — Progress Notes (Addendum)
Daily Progress Note Intern Pager: [phone]  Patient name: [name] Medical record number: [id number] Date of birth: 1940/12/17 Age: 81 y.o. Gender: female  Primary Care Provider: Idelle Crouch, MD Consultants: Neuro Code Status: Full code, pending discussion with patient and presentation of DNR/DNI paperwork   Pt Overview and Major Events to Date:  08/19/22 - Admitted to FMTS  Assessment and Plan: [name] is a 81 y.o. female presenting with acute left sided weakness, left sided facial droop, and expressive aphasia, admitted for acute stroke.  * Stroke [facility]) EEG suggesting cortical dysfunction in right hemisphere likely 2/2 underlying structural abnormality, negative for seizures or epileptiform discharges. MRI revealed acute infarct in the right corona radiata and small acute infarct in the right anterior thalamus, as well as chronic microvascular ischemic changes in the white matter and thalamus bilaterally and chronic infarcts in the basal ganglia bilaterally. - Atorvastatin '40mg'$  daily  - Permissive HTN goal <220/110, see above - TTE pending  - Pending neuro recs for DAPT - q4h neuro checks and routine neuro exams with STAT head CT for any changes  - Med-tele cardiac monitoring  - PT/OT/SLP consult, appreciate recs  - Appreciate neuro assistance   Agitation Overnight, team was called for patient agitation and pulling at lines. She gets claustrophobic and hot overnight.   - Keep room cool overnight  - Keep mitts in place - Atarax TID PRN  Leukocytosis Likely reactive vs chronic steroid use as cause, AM CBC shows still elevated by downtrending. 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 No formal RA diagnosis found in chart review. She refills prednisone monthly and has been taking it for years, she gets a rash on upper and lower extremities if stops it.  - Restart  Prednisone 10 mg - Needs ambulatory referral to rheumatology for formal RA diagnosis and treatment upon discharge    Hypokalemia K 3.4 today, replete as indicated.   History of CVA (cerebrovascular accident) Was previously on ASA '325mg'$  BID per chart review but unclear why this dosage. Await neurology recs for DAPT therapy.   Asthma without status asthmaticus Albuterol PRN   Anxiety Reported use of Cymbalta 30 mg, restarted.   Acquired hypothyroidism Last TSH wnl 3 weeks ago in [place]  - Restarted home Synthroid   Essential hypertension Uncontrolled today, allowing permissive HTN.  Home medication of Losartan and Propranolol held currently, will reintroduce today. - Allow permissive HTN to SBP <200 per neuro  - Labetalol and hydral PRN for SBP >200 - Hold home medications until this afternoon if stable - VS per floor protocol   Chronic conditions managed by restarting or giving hospital equivalent of:  HTN: Losartan 50 mg, Propranolol 60 mg  Depression: Duloxetine 30 mg  Documented post herpetic neuralgia > had been on gabapentin 300 mg as well as prednisone which is documented under Positive Anti-CCP test. Holding gabapentin today due to lethargy.  FEN/GI: Dysphagia 3 (Mech soft); Thin liquid  PPx: Deferred pending neuro recs  Dispo:Home pending clinical improvement . Barriers include stroke workup and risk stratification.   Subjective:  Overnight, team was called for patient agitation and pulling at lines. Need to keep room cool overnight due to claustrophobia and feeling hot, and keep mitts in place. Patient was also given Atarax 2 times PRN overnight.  Patient was seen with husband at bedside. Patient was asleep and sedated  and opened eyes/moaned to sternal rub as well as moved to localize pain. Obtained further hx that patient has been taking prednisone daily for years because if she stops she develops rash on UE and LE. Husband also thinks that she's on so many meds  they might be working against each other. Explained that we will optimize her regimen to prevent further ischemic events.   Objective: Temp:  [97.8 F (36.6 C)-98.6 F (37 C)] 98.2 F (36.8 C) (09/13 0742) Pulse Rate:  [75-109] 86 (09/13 0742) Resp:  [17-25] 18 (09/13 0742) BP: (160-231)/(66-97) 195/88 (09/13 0742) SpO2:  [90 %-99 %] 96 % (09/13 0742) Weight:  [53.1 kg] 53.1 kg (09/12 1307) Physical Exam: General: asleep, sedated, opens eyes and moves extremities to sternal rub Cardiovascular: RRR S1 S2 present, no rubs murmurs or gallops  Respiratory: CTAB. No increased WOB. Chest rise and fall symmetric bilaterally.  Abdomen: Soft, non-tender, no hepatosplenomegaly, +BS  Extremities: No swelling or edema in LE bilaterally   Laboratory: Most recent CBC Lab Results  Component Value Date   WBC 15.1 (H) 08/20/2022   HGB 13.0 08/20/2022   HCT 39.2 08/20/2022   MCV 88.1 08/20/2022   PLT 316 08/20/2022   Most recent BMP    Latest Ref Rng & Units 08/20/2022    3:38 AM  BMP  Glucose 70 - 99 mg/dL 122   BUN 8 - 23 mg/dL 7   Creatinine 0.44 - 1.00 mg/dL 0.83   Sodium 135 - 145 mmol/L 135   Potassium 3.5 - 5.1 mmol/L 3.4   Chloride 98 - 111 mmol/L 97   CO2 22 - 32 mmol/L 25   Calcium 8.9 - 10.3 mg/dL 8.8    MR BRAIN WO CONTRAST 08/19/22  IMPRESSION: Acute infarct in the right corona radiata. Small acute infarct in the right anterior thalamus   Chronic microvascular ischemic change in the white matter and thalamus bilaterally. Chronic infarcts in the basal ganglia bilaterally.  Lupita Raider, Medical Student 08/20/2022, 10:44 AM [place] Intern pager: [phone], text pages welcome Secure chat group [place] Upper-Level Resident Addendum   I have independently interviewed and examined the patient. I have discussed the above with the original author and agree with their documentation. My edits for  correction/addition/clarification are in within the document. Please see also any attending notes.   Rise Patience, DO  PGY-3, [facility] 08/20/2022 12:00 PM  [place] Service pager: [phone] (text pages welcome through [facility])

## 2022-08-20 NOTE — Hospital Course (Addendum)
[name] is a 81yo F w/ hx of strokes, HTN, seizure disorder, hypothyroidism, anxiety who was admitted to [facility] for acute stroke.   Stroke Pt presented w/ 1 day hx of L sided weakness, L facial droop, and expressive aphasia. CT and CTA unremarkable. EEG showed cortical dysfunction in R hemisphere. MRI showed acute infarct in R corona radiata and small infarct in R anterior thalamus. Neuro consulted and started pt on ASA and Plavix for 3wks. Also started on atorvastatin.   Hypertension Allowed permissive HTN in setting of stroke (Goal <220/110). Home losartan and propanolol was held. Prior to discharge, home losartan and propanolol. Will need continued titration with medication to optimize blood pressure control.

## 2022-08-20 NOTE — Progress Notes (Signed)
  Transition of Care (TOC) Screening Note   Patient Details  Name: [name] Date of Birth: Nov 14, 1941   Transition of Care [facility]) CM/SW Contact:    Pollie Friar, RN Phone Number: 08/20/2022, 3:35 PM   Pt is from home with spouse. Current recommendations are for CIR. Transition of Care Department [facility]) has reviewed patient. We will continue to monitor patient advancement through interdisciplinary progression rounds. If new patient transition needs arise, please place a TOC consult.

## 2022-08-20 NOTE — Progress Notes (Signed)
FMTS Interim Progress Note  S: Eval patient ~12am for agitation and pulling at lines. Pt was laying in bed naked and husband was patting her with a wet towel. Reports that she has hx of claustrophobia and gets hot and agitated at home. Pt reports feeling claustrophobic and hot. L mitt in place, R mitt off but pt not actively pulling at lines.   O: BP (!) 160/82 (BP Location: Right Arm)   Pulse 98   Temp 98.6 F (37 C) (Oral)   Resp 18   Ht '4\' 11"'$  (1.499 m)   Wt 53.1 kg   SpO2 95%   BMI 23.63 kg/m   Neuro: A&O to self, place [facility]), and time (Wednesday) Skin: Cool, dry  A/P: Plan to decrease temp in pt room for comfort. Hold off on sedating meds to monitor neuro status. - Keep room cool - Mitts in place - If agitation worsens, consider Atarax or ativan  Arlyce Dice, MD 08/20/2022, 12:22 AM PGY-1, [place] [facility] Service pager [phone]

## 2022-08-21 DIAGNOSIS — F419 Anxiety disorder, unspecified: Secondary | ICD-10-CM | POA: Diagnosis not present

## 2022-08-21 DIAGNOSIS — K219 Gastro-esophageal reflux disease without esophagitis: Secondary | ICD-10-CM

## 2022-08-21 DIAGNOSIS — I639 Cerebral infarction, unspecified: Secondary | ICD-10-CM | POA: Diagnosis not present

## 2022-08-21 DIAGNOSIS — J45909 Unspecified asthma, uncomplicated: Secondary | ICD-10-CM | POA: Diagnosis not present

## 2022-08-21 DIAGNOSIS — I10 Essential (primary) hypertension: Secondary | ICD-10-CM | POA: Diagnosis not present

## 2022-08-21 LAB — BASIC METABOLIC PANEL
Anion gap: 10 (ref 5–15)
BUN: 7 mg/dL — ABNORMAL LOW (ref 8–23)
CO2: 21 mmol/L — ABNORMAL LOW (ref 22–32)
Calcium: 8.8 mg/dL — ABNORMAL LOW (ref 8.9–10.3)
Chloride: 108 mmol/L (ref 98–111)
Creatinine, Ser: 0.73 mg/dL (ref 0.44–1.00)
GFR, Estimated: >60 mL/min (ref >60)
Glucose, Bld: 94 mg/dL (ref 70–99)
Potassium: 3.3 mmol/L — ABNORMAL LOW (ref 3.5–5.1)
Sodium: 139 mmol/L (ref 135–145)

## 2022-08-21 LAB — CBC
HCT: 38.8 % (ref 36.0–46.0)
Hemoglobin: 12.6 g/dL (ref 12.0–15.0)
MCH: 28.8 pg (ref 26.0–34.0)
MCHC: 32.5 g/dL (ref 30.0–36.0)
MCV: 88.6 fL (ref 80.0–100.0)
Platelets: 313 10*3/uL (ref 150–400)
RBC: 4.38 MIL/uL (ref 3.87–5.11)
RDW: 15.6 % — ABNORMAL HIGH (ref 11.5–15.5)
WBC: 11.6 10*3/uL — ABNORMAL HIGH (ref 4.0–10.5)
nRBC: 0 % (ref 0.0–0.2)

## 2022-08-21 MED ORDER — POTASSIUM CHLORIDE 20 MEQ PO PACK
40.0000 meq | PACK | Freq: Once | ORAL | Status: AC
  Administered 2022-08-21: 40 meq via ORAL
  Filled 2022-08-21: qty 2

## 2022-08-21 MED ORDER — KETOROLAC TROMETHAMINE 15 MG/ML IJ SOLN
15.0000 mg | Freq: Once | INTRAMUSCULAR | Status: AC
  Administered 2022-08-21: 15 mg via INTRAVENOUS
  Filled 2022-08-21: qty 1

## 2022-08-21 MED ORDER — PROPRANOLOL HCL ER 60 MG PO CP24
60.0000 mg | ORAL_CAPSULE | Freq: Every day | ORAL | Status: DC
  Administered 2022-08-22: 60 mg via ORAL
  Filled 2022-08-21: qty 1

## 2022-08-21 MED ORDER — KETOROLAC TROMETHAMINE 15 MG/ML IJ SOLN: 15.0000 mg | Freq: Once | INTRAMUSCULAR | Status: DC

## 2022-08-21 MED ORDER — DIPHENHYDRAMINE HCL 50 MG/ML IJ SOLN
12.5000 mg | Freq: Once | INTRAMUSCULAR | Status: AC
  Administered 2022-08-21: 12.5 mg via INTRAVENOUS
  Filled 2022-08-21: qty 1

## 2022-08-21 MED ORDER — ASPIRIN 81 MG PO TBEC
81.0000 mg | DELAYED_RELEASE_TABLET | Freq: Every day | ORAL | Status: DC
  Administered 2022-08-22: 81 mg via ORAL
  Filled 2022-08-21: qty 1

## 2022-08-21 MED ORDER — METOCLOPRAMIDE HCL 5 MG/ML IJ SOLN
5.0000 mg | Freq: Once | INTRAMUSCULAR | Status: AC
  Administered 2022-08-21: 5 mg via INTRAVENOUS
  Filled 2022-08-21: qty 2

## 2022-08-21 MED ORDER — FAMOTIDINE 20 MG PO TABS
20.0000 mg | ORAL_TABLET | Freq: Once | ORAL | Status: AC
  Administered 2022-08-21: 20 mg via ORAL
  Filled 2022-08-21: qty 1

## 2022-08-21 NOTE — Progress Notes (Signed)
Occupational Therapy Treatment Patient Details Name: [name] MRN: [id number] DOB: 06-06-1941 Today's Date: 08/21/2022   History of present illness Pt is a 81 y/o female admitted 9/12 with L sided weakness and aphasia.  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   OT comments  Patient supine in bed, agreeable to OT session.  Pt appears very restless, complaining of headache- RN aware. Session focused on L UE, remained bed level this date.  PROM to UE, reviewed exercises to complete (AROM shoulder elevation, AAROM/SROM shoulder flexion to 90) and further assessed need for splint.  Pt demonstrating ability to elicit L hand grasp with increased effort and combined flexion of elbow, wrist and IR of shoulder. Pt using towel to support L UE in bed, but continues to reports her hand tends to flex throughout the day/night.  Reached out to MD and ordered resting hand splint for pt.  Will follow acutely.    Recommendations for follow up therapy are one component of a multi-disciplinary discharge planning process, led by the attending physician.  Recommendations may be updated based on patient status, additional functional criteria and insurance authorization.    Follow Up Recommendations  Acute inpatient rehab (3hours/day)    Assistance Recommended at Discharge Frequent or constant Supervision/Assistance  Patient can return home with the following  A lot of help with walking and/or transfers;A lot of help with bathing/dressing/bathroom;Assistance with cooking/housework;Assistance with feeding;Direct supervision/assist for medications management;Direct supervision/assist for financial management;Assist for transportation;Help with stairs or ramp for entrance   Equipment Recommendations  Other (comment) (defer)    Recommendations for Other Services Rehab consult    Precautions / Restrictions  Precautions Precautions: Fall Restrictions Weight Bearing Restrictions: No       Mobility Bed Mobility                    Transfers                         Balance                                           ADL either performed or assessed with clinical judgement   ADL                                         General ADL Comments: focused on L UE today    Extremity/Trunk Assessment Upper Extremity Assessment Upper Extremity Assessment: LUE deficits/detail LUE Deficits / Details: reamins with 3-/5MMT shoulder elevation, retraction; able to actively produce hand flexion through flexion of shoulder/elbow and wrist together.  Pt reports UE felt like it was not part of her last night. Reports hand continues to draw into flexion. LUE Sensation: WNL LUE Coordination: decreased fine motor;decreased gross motor            Vision       Perception     Praxis      Cognition Arousal/Alertness: Awake/alert Behavior During Therapy: Flat affect, Restless Overall Cognitive Status: Impaired/Different from baseline Area of Impairment: Attention, Problem solving, Awareness, Safety/judgement  Current Attention Level: Sustained   Following Commands: Follows one step commands consistently, Follows one step commands with increased time Safety/Judgement: Decreased awareness of deficits, Decreased awareness of safety Awareness: Emergent Problem Solving: Slow processing, Decreased initiation, Difficulty sequencing, Requires verbal cues General Comments: pt with good recall of protecting L UE by placing on pillow to support, internally distracted by headache today and very restless in bed.        Exercises Exercises: Other exercises Other Exercises Other Exercises: Completed 3 reps shoulder elevation, educated pt to continue to work on this exercises throughout the day. Other Exercises: X 3 reps hand flexion  after PROM of UE from shoulder to hand.    Shoulder Instructions       General Comments cleared and ordered resting hand splint for pt L hand    Pertinent Vitals/ Pain       Pain Assessment Pain Assessment: Faces Faces Pain Scale: Hurts whole lot Pain Location: headache Pain Descriptors / Indicators: Headache Pain Intervention(s): Limited activity within patient's tolerance, Monitored during session, Patient requesting pain meds-RN notified  Home Living                                          Prior Functioning/Environment              Frequency  Min 2X/week        Progress Toward Goals  OT Goals(current goals can now be found in the care plan section)  Progress towards OT goals: Progressing toward goals  Acute Rehab OT Goals Patient Stated Goal: get better OT Goal Formulation: With patient Time For Goal Achievement: 09/03/22 Potential to Achieve Goals: Good  Plan Discharge plan remains appropriate;Frequency remains appropriate    Co-evaluation                 AM-PAC OT "6 Clicks" Daily Activity     Outcome Measure   Help from another person eating meals?: A Lot Help from another person taking care of personal grooming?: A Lot Help from another person toileting, which includes using toliet, bedpan, or urinal?: Total Help from another person bathing (including washing, rinsing, drying)?: A Lot Help from another person to put on and taking off regular upper body clothing?: A Lot Help from another person to put on and taking off regular lower body clothing?: Total 6 Click Score: 10    End of Session    OT Visit Diagnosis: Other abnormalities of gait and mobility (R26.89);Muscle weakness (generalized) (M62.81);Hemiplegia and hemiparesis;Other symptoms and signs involving cognitive function Hemiplegia - Right/Left: Left Hemiplegia - dominant/non-dominant: Non-Dominant Hemiplegia - caused by: Cerebral infarction   Activity  Tolerance Patient limited by pain   Patient Left in bed;with call bell/phone within reach;with bed alarm set;with family/visitor present   Nurse Communication Mobility status        Time: 5397-6734 OT Time Calculation (min): 16 min  Charges: OT General Charges $OT Visit: 1 Visit OT Treatments $Neuromuscular Re-education: 8-22 mins  Jolaine Artist, [place] Office [phone]   Delight Stare 08/21/2022, 12:07 PM

## 2022-08-21 NOTE — PMR Pre-admission (Signed)
PMR Admission Coordinator Pre-Admission Assessment  Patient: [name] is an 81 y.o., female MRN: [id number] DOB: [birth year] Height: _0  (149.9 cm) Weight: 53.1 kg  Insurance Information HMO: Yes    PPO:       PCP:       IPA:       80/20:       OTHER:  Gold Plus PRIMARY: [employer] Medicare      Policy#: [id number]      Subscriber: patient CM Name: Jeanella Craze       Phone#: [phone]     Fax#: [place] [facility] called with approval 9/15 for admit 7/26-2/03 Pre-Cert#: [id number]      Employer: Retired Benefits:  Phone #: [phone]     Name: online at [email or web address] Eff. Date: 12/08/21     Deduct: $0      Out of Pocket Max: $3400 (met $870.49)      Life Max: N/A CIR: $295/day for days 1-6 max $1770/admission      SNF: $0 days 1-20; $196 days 21-100 Outpatient: med nec     Co-Pay: $10-$20/visit co-pay Home Health: 100%      Co-Pay: none DME: 80%     Co-Pay: 20% Providers: in network  SECONDARY:       Policy#:      Phone#:   Development worker, community:       Phone#:   [employer], [employer]" for patients in Inpatient Rehabilitation Facilities with attached "Privacy Act Star Records" was provided and verbally reviewed with: Patient  Emergency Contact Information Contact Information     Name Relation Home Work Mobile   Terpstra,[name] Spouse [phone]         Current Medical History  Patient Admitting Diagnosis: R CVA  History of Present Illness: A 81 y.o. female with history of knee replacement, seizure, HTN, hypothyroidism, shingles, headaches and anxiety who presents with left arm weakness, left visual field cut, dysarthria, nausea and vomiting.  Patient's symptoms began at 1330 yesterday, and she initially refused to be seen for them. Today, her husband insisted she come to the ED.  Patient has had about for episodes in the past of headache, vision disturbance and left sided weakness which have resolved on their own in about 30  minutes without intervention. She has not been seen for these episodes.  She does endorse a headache with nausea and vomiting today. Patient reports that she has had a seizure in the past with generalized shaking after a back injury.  No seizure activity was noted by patient or husband prior to her presenting symptoms.  She was noted to be quite hypertensive on arrival with SBP in the 230s.  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  Small acute infarct in the right anterior thalamus.  PT/OT/SLP evaluations completed with recommendations for acute inpatient rehab admission.  Complete NIHSS TOTAL: 10  Patient's medical record from [facility] has been reviewed by the rehabilitation admission coordinator and physician.  Past Medical History  Past Medical History:  Diagnosis Date   Anemia    Arthritis    Asthma    uses inhaler just prior to surgery to avoid attack   Back pain    from previous injury   Complication of anesthesia    has woken  up during 2 different surgery   Depression    no current issue/treatment; situation   Gallstones    GERD (gastroesophageal reflux disease)    Hiatal  hernia    patient does NOT have nerve/muscle disease   History of kidney stones    HLD (hyperlipidemia)    HTN (hypertension)    Hypothyroidism    Kidney stones    Knee pain    Non-diabetic pancreatic hormone dysfunction years   pt. 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   Pancreatitis    Pneumonia    Seizures ([place])    caused by dye injected during a procedure   Shortness of breath    with exertion   Sinus problem    frequent infections/congestion   Stroke [facility]) 2021   reports having CVA in 2021 and having mini strokes before that   Thyroid disease     Has the patient had major surgery during 100 days prior to admission? No  Family History   family history includes Anesthesia problems in her father; Breast cancer in her cousin, maternal aunt, and maternal  grandmother; Heart attack in her paternal uncle; Heart attack (age of onset: 34) in her father; Heart disease in her father; Pancreatic cancer in her cousin; Stroke in her paternal grandfather.  Current Medications  Current Facility-Administered Medications:    acetaminophen (TYLENOL) tablet 1,000 mg, 1,000 mg, Oral, Q6H PRN, Arlyce Dice, MD, 1,000 mg at 08/21/22 0845   albuterol (PROVENTIL) (2.5 MG/3ML) 0.083% nebulizer solution 2.5 mg, 2.5 mg, Inhalation, Q4H PRN, Zigmund Daniel, Allee, MD, 2.5 mg at 08/21/22 0453   [START ON 08/22/2022] aspirin EC tablet 81 mg, 81 mg, Oral, Daily, Cozart, Hannah, RPH   atorvastatin (LIPITOR) tablet 40 mg, 40 mg, Oral, Daily, Maxwell, Allee, MD, 40 mg at 08/21/22 0845   clopidogrel (PLAVIX) tablet 75 mg, 75 mg, Oral, Daily, Maxwell, Allee, MD, 75 mg at 08/21/22 0845   DULoxetine (CYMBALTA) DR capsule 30 mg, 30 mg, Oral, Daily, Maxwell, Allee, MD, 30 mg at 08/21/22 0845   enoxaparin (LOVENOX) injection 40 mg, 40 mg, Subcutaneous, Q24H, Lilland, Alana, DO, 40 mg at 94/76/54 6503   folic acid (FOLVITE) tablet 1 mg, 1 mg, Oral, Daily, Maxwell, Allee, MD, 1 mg at 08/21/22 0845   levothyroxine (SYNTHROID) tablet 50 mcg, 50 mcg, Oral, QAC breakfast, Zigmund Daniel, Allee, MD, 50 mcg at 08/21/22 0555   ondansetron (ZOFRAN-ODT) disintegrating tablet 8 mg, 8 mg, Oral, Q8H PRN, Arlyce Dice, MD   pantoprazole (PROTONIX) EC tablet 40 mg, 40 mg, Oral, Daily, Maxwell, Allee, MD, 40 mg at 08/21/22 0845   predniSONE (DELTASONE) tablet 10 mg, 10 mg, Oral, Daily, Lilland, Alana, DO, 10 mg at 08/21/22 0844   [START ON 08/22/2022] propranolol ER (INDERAL LA) 24 hr capsule 60 mg, 60 mg, Oral, Daily, Zigmund Daniel, Allee, MD  Patients Current Diet:  Diet Order             DIET DYS 3 Room service appropriate? Yes; Fluid consistency: Thin  Diet effective now                   Precautions / Restrictions Precautions Precautions: Fall Restrictions Weight Bearing Restrictions: No   Has the  patient had 2 or more falls or a fall with injury in the past year? Yes  Prior Activity Level Limited Community (1-2x/wk): Went out 3-4 times a week  Prior Functional Level Self Care: Did the patient need help bathing, dressing, using the toilet or eating? Needed some help  Indoor Mobility: Did the patient need assistance with walking from room to room (with or without device)? Independent  Stairs: Did the patient need assistance with internal or external  stairs (with or without device)? Needed some help  Functional Cognition: Did the patient need help planning regular tasks such as shopping or remembering to take medications? Needed some help  Patient Information Are you of Hispanic, Latino/a,or Spanish origin?: A. No, not of Hispanic, Latino/a, or Spanish origin What is your race?: A. White Do you need or want an interpreter to communicate with a doctor or health care staff?: 0. No  Patient's Response To:  Health Literacy and Transportation Is the patient able to respond to health literacy and transportation needs?: Yes Health Literacy - How often do you need to have someone help you when you read instructions, pamphlets, or other written material from your doctor or pharmacy?: Never In the past 12 months, has lack of transportation kept you from medical appointments or from getting medications?: No In the past 12 months, has lack of transportation kept you from meetings, work, or from getting things needed for daily living?: No  Development worker, international aid / Equipment Home Equipment: Shower seat - built in, Toilet riser, BSC/3in1  Prior Device Use: Indicate devices/aids used by the patient prior to current illness, exacerbation or injury? Walker  Current Functional Level Cognition  Overall Cognitive Status: Impaired/Different from baseline Current Attention Level: Sustained Orientation Level: Oriented X4 Following Commands: Follows one step commands consistently, Follows one step  commands with increased time Safety/Judgement: Decreased awareness of deficits, Decreased awareness of safety General Comments: pt with good recall of protecting L UE by placing on pillow to support, internally distracted by headache today and very restless in bed.    Extremity Assessment (includes Sensation/Coordination)  Upper Extremity Assessment: LUE deficits/detail LUE Deficits / Details: reamins with 3-/5MMT shoulder elevation, retraction; able to actively produce hand flexion through flexion of shoulder/elbow and wrist together.  Pt reports UE felt like it was not part of her last night. Reports hand continues to draw into flexion. LUE Sensation: WNL LUE Coordination: decreased fine motor, decreased gross motor  Lower Extremity Assessment: LLE deficits/detail LLE Deficits / Details: 4/5 strength grossly in quads, hamstrings, hip flexors    ADLs  Overall ADL's : Needs assistance/impaired Grooming: Minimal assistance, Sitting Upper Body Dressing : Maximal assistance, Sitting Lower Body Dressing: Total assistance, +2 for physical assistance, +2 for safety/equipment, Sit to/from stand Toilet Transfer: Moderate assistance, Minimal assistance, +2 for physical assistance, +2 for safety/equipment, Stand-pivot, Rolling walker (2 wheels) Toileting- Clothing Manipulation and Hygiene: Total assistance, +2 for physical assistance, +2 for safety/equipment, Sit to/from stand Toileting - Clothing Manipulation Details (indicate cue type and reason): incontient bladder in recliner, total assist for hygiene and changing clothing Functional mobility during ADLs: Minimal assistance, +2 for safety/equipment, +2 for physical assistance, Moderate assistance General ADL Comments: focused on L UE today    Mobility  Overal bed mobility: Needs Assistance Bed Mobility: Rolling, Sidelying to Sit Rolling: Min assist Sidelying to sit: Mod assist General bed mobility comments: cueing for technique, support for  trunk and scooting forward    Transfers  Overall transfer level: Needs assistance Equipment used: Rolling walker (2 wheels) Transfers: Sit to/from Stand, Bed to chair/wheelchair/BSC Sit to Stand: Mod assist, Min assist, +2 physical assistance, +2 safety/equipment Bed to/from chair/wheelchair/BSC transfer type:: Step pivot Stand pivot transfers: Min assist, +2 physical assistance, +2 safety/equipment General transfer comment: from EOB mod assist +2, min from recliner; cueing for hand placement, safety and technique. preference for L lateral lean and assist for placement/holding RW with L hand.    Ambulation / Gait / Stairs /  Wheelchair Mobility  Ambulation/Gait General Gait Details: Unable to progress to gait training at this time.    Posture / Balance Dynamic Sitting Balance Sitting balance - Comments: lateral L lean, able to correct to midline with cueing. Balance Overall balance assessment: Needs assistance Sitting-balance support: Feet supported, Single extremity supported Sitting balance-Leahy Scale: Poor Sitting balance - Comments: lateral L lean, able to correct to midline with cueing. Postural control: Left lateral lean Standing balance support: During functional activity, Single extremity supported, Bilateral upper extremity supported Standing balance-Leahy Scale: Poor Standing balance comment: relies on UE and external support    Special needs/care consideration Special service needs none    Previous Home Environment (from acute therapy documentation) Living Arrangements: Spouse/significant other Available Help at Discharge: Family, Available 24 hours/day Type of Home: House Home Layout: Two level, Able to live on main level with bedroom/bathroom Home Access: Stairs to enter Entrance Stairs-Rails: Right, Left Entrance Stairs-Number of Steps: 7 (3 steps front, no rail) Bathroom Shower/Tub: Multimedia programmer: Handicapped height Additional Comments: Owns  3WW  Discharge Living Setting Plans for Discharge Living Setting: Patient's home, House, Lives with (comment) (Lives with husband.) Type of Home at Discharge: House Discharge Home Layout: Two level, Able to live on main level with bedroom/bathroom Alternate Level Stairs-Number of Steps: Flight Discharge Home Access: Stairs to enter Entrance Stairs-Rails: Right, Left, Can reach both Entrance Stairs-Number of Steps: 6 steps at the back with rails; front with 3 steps but no rails Discharge Bathroom Shower/Tub: Walk-in shower, Door Discharge Bathroom Toilet: Standard Discharge Bathroom Accessibility: Yes How Accessible: Accessible via walker  Social/Family/Support Systems Patient Roles: Spouse (Lives with spouse.) Contact Information: Beather Arbour - spouse Anticipated Caregiver: husband Anticipated Caregiver's Contact Information: [name] - husband - [phone] Ability/Limitations of Caregiver: Husband can assist as needed. Caregiver Availability: 24/7 Discharge Plan Discussed with Primary Caregiver: Yes Is Caregiver In Agreement with Plan?: Yes Does Caregiver/Family have Issues with Lodging/Transportation while Pt is in Rehab?: No  Goals Patient/Family Goal for Rehab: PT/OT/SLP supervision to minguard assistance Expected length of stay: 7-10 days Pt/Family Agrees to Admission and willing to participate: Yes Program Orientation Provided & Reviewed with Pt/Caregiver Including Roles  & Responsibilities: Yes  Decrease burden of Care through IP rehab admission: N/A  Possible need for SNF placement upon discharge: Not planned  Patient Condition: I have reviewed medical records from [facility], spoken with CM, and patient and spouse. I met with patient at the bedside and spoke to husband by telephone for inpatient rehabilitation assessment.  Patient will benefit from ongoing PT, OT, and SLP, can actively participate in 3 hours of therapy a day 5 days of the week, and can make measurable gains  during the admission.  Patient will also benefit from the coordinated team approach during an Inpatient Acute Rehabilitation admission.  The patient will receive intensive therapy as well as Rehabilitation physician, nursing, social worker, and care management interventions.  Due to bladder management, bowel management, safety, skin/wound care, disease management, medication administration, pain management, and patient education the patient requires 24 hour a day rehabilitation nursing.  The patient is currently Min-mod A with mobility and basic ADLs.  Discharge setting and therapy post discharge at home with home health is anticipated.  Patient has agreed to participate in the Acute Inpatient Rehabilitation Program and will admit today.  Preadmission Screen Completed By:  Retta Diones, 08/21/2022 2:44 PM ______________________________________________________________________   Discussed status with Dr. Tressa Busman  on 08/22/22 at 36 and received approval for  admission today.  Admission Coordinator:  Retta Diones, RN, time 1318/Date 08/22/22   Assessment/Plan: Diagnosis: Does the need for close, 24 hr/day Medical supervision in concert with the patient's rehab needs make it unreasonable for this patient to be served in a less intensive setting? Yes Co-Morbidities requiring supervision/potential complications: S/P cva with severe hypertension, hypokalemia, agitation Due to bladder management, bowel management, safety, disease management, medication administration, and patient education, does the patient require 24 hr/day rehab nursing? Yes Does the patient require coordinated care of a physician, rehab nurse, PT, OT, and SLP to address physical and functional deficits in the context of the above medical diagnosis(es)? Yes Addressing deficits in the following areas: balance, endurance, locomotion, strength, transferring, bowel/bladder control, bathing, dressing, feeding, grooming, toileting, cognition, and  swallowing Can the patient actively participate in an intensive therapy program of at least 3 hrs of therapy 5 days a week? Yes The potential for patient to make measurable gains while on inpatient rehab is excellent Anticipated functional outcomes upon discharge from inpatient rehab: modified independent and supervision PT, modified independent and supervision OT, independent and modified independent SLP Estimated rehab length of stay to reach the above functional goals is: 10-14 days Anticipated discharge destination: Home 10. Overall Rehab/Functional Prognosis: excellent   MD Signature:  Gertie Gowda, DO 08/22/2022

## 2022-08-21 NOTE — Progress Notes (Signed)
Mobility Specialist: Progress Note   08/21/22 1534  Mobility  Activity Ambulated with assistance in room  Level of Assistance Moderate assist, patient does 50-74%  Assistive Device Other (Comment) (HHA)  Distance Ambulated (ft) 12 ft  Activity Response Tolerated well  $Mobility charge 1 Mobility   Pt received in the bed and agreeable to mobility. Required minA with bed mobility and modA for balance during ambulation. Some verbal and tactile cues for gait progression and turning. Pt to the chair after session with call bell in reach and family present in the room.   [facility] [name] Mobility Specialist Mobility Specialist [address]: [phone]

## 2022-08-21 NOTE — Progress Notes (Addendum)
Daily Progress Note Intern Pager: [phone]  Patient name: [name] Medical record number: [id number] Date of birth: [birth year] Age: 81 y.o. Gender: female  Primary Care Provider: Idelle Crouch, MD Consultants: Neuro Code Status: DNR/DNI  Pt Overview and Major Events to Date:  08/19/22 - Admitted to FMTS  Assessment and Plan: [name] is a 81 y.o. female presenting with acute left sided weakness, left sided facial droop, and expressive aphasia, admitted for acute stroke.  * Stroke [facility]) EEG suggesting cortical dysfunction in right hemisphere likely 2/2 underlying structural abnormality, negative for seizures or epileptiform discharges. MRI revealed acute infarct in the right corona radiata and small acute infarct in the right anterior thalamus, as well as chronic microvascular ischemic changes in the white matter and thalamus bilaterally and chronic infarcts in the basal ganglia bilaterally. TTE showed 55-60% ejection fraction, no PFO. - Atorvastatin '40mg'$  daily  - DAPT with aspirin 81 and Plavix 75 mg for 3 weeks  - q4h neuro checks and routine neuro exams with STAT head CT for any changes  - Med-tele cardiac monitoring  - PT/OT/SLP consult, appreciate recs (hand splint today for flaccid L arm)  - Appreciate neuro assistance - Headache cocktail: tramadol, acetaminophen 1,000, reglan, Benadryl PRN   Essential hypertension Uncontrolled today.  - Per neuro, permissive hypertension (< 220/120) and gradually normalize in 5-7 days - Holding home losartan and propranolol for now  - VS per floor protocol   GERD (gastroesophageal reflux disease) Patient had epigastric pain that was reproduced with palpation.  - Pepcid given 1x overnight  - protonix 40 mg   Agitation On first night, team was called for patient agitation and pulling at lines. She gets claustrophobic and hot overnight.   - Keep room cool overnight  - Keep mitts in place  Leukocytosis Likely reactive vs  chronic steroid use as cause, AM CBC shows still elevated but downtrending. 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 No formal RA diagnosis found in chart review. She refills prednisone monthly and has been taking it for years, she gets a rash on upper and lower extremities if stops it.  - Continuing home Prednisone 10 mg - Needs ambulatory referral to rheumatology for formal RA diagnosis and treatment upon discharge   - Will most likely need bisphosphonate upon discharge due to age and chronic steroid use   Hypokalemia K 3.3 today, repleted   History of CVA (cerebrovascular accident) Was previously on ASA '325mg'$  BID per chart review but unclear why this dosage. - Aspirin 81 and plavix 75 mg for 3 weeks   Asthma without status asthmaticus Albuterol PRN   Anxiety On home Cymbalta 30 mg.  Acquired hypothyroidism Last TSH wnl 3 weeks ago in [place] Synthroid    FEN/GI: Dysphagia 3 (Mech soft); Thin liquid  PPx: Deferred pending neuro recs  Dispo:CIR  pending bed availability .   Subjective:  Patient was seen at bedside with husband. She is very anxious and muttering continuously, unsure if this is baseline for her. She describes 12/10 headache. Her epigastric pain is improving. She confirmed that she is DNR/DNI.   Objective: Temp:  [97.7 F (36.5 C)-98.4 F (36.9 C)] 98.1 F (36.7 C) (09/14 1153) Pulse Rate:  [79-100] 97 (09/14 1153) Resp:  [14-20] 16 (09/14 1153) BP: (171-225)/(62-94) 216/88 (09/14 1153) SpO2:  [85 %-98 %] 96 % (09/14  1153) Physical Exam: General: alert, anxious, following commands Cardiovascular: RRR S1 S2 present, no rubs, murmurs, gallops  Respiratory: CTAB. No increased WOB. Abdomen: Soft, nondistended, only mild epigastric tenderness  Neuro: Facial droop on left, no aphasia but mild dysarthria, left UE immobile 0/5 strength, sensation intact  throughout left UE, strength 5/5 symmetric in bilateral LE   Laboratory: Most recent CBC Lab Results  Component Value Date   WBC 11.6 (H) 08/21/2022   HGB 12.6 08/21/2022   HCT 38.8 08/21/2022   MCV 88.6 08/21/2022   PLT 313 08/21/2022   Most recent BMP    Latest Ref Rng & Units 08/21/2022    7:09 AM  BMP  Glucose 70 - 99 mg/dL 94   BUN 8 - 23 mg/dL 7   Creatinine 0.44 - 1.00 mg/dL 0.73   Sodium 135 - 145 mmol/L 139   Potassium 3.5 - 5.1 mmol/L 3.3   Chloride 98 - 111 mmol/L 108   CO2 22 - 32 mmol/L 21   Calcium 8.9 - 10.3 mg/dL 8.8    TTE 08/20/22  IMPRESSIONS   1. Left ventricular ejection fraction, by estimation, is 55 to 60%. The  left ventricle has normal function. The left ventricle has no regional  wall motion abnormalities. There is mild left ventricular hypertrophy.  Left ventricular diastolic parameters  are consistent with Grade I diastolic dysfunction (impaired relaxation).   2. Right ventricular systolic function is normal. The right ventricular  size is normal. Tricuspid regurgitation signal is inadequate for assessing  PA pressure.   3. The mitral valve is normal in structure. No evidence of mitral valve  regurgitation. No evidence of mitral stenosis.   4. The aortic valve is tricuspid. There is mild calcification of the  aortic valve. Aortic valve regurgitation is not visualized. No aortic  stenosis is present.   5. The inferior vena cava is normal in size with greater than 50%  respiratory variability, suggesting right atrial pressure of 3 mmHg.    Lupita Raider, Medical Student 08/21/2022, 1:31 PM [place] Intern pager: [phone], text pages welcome Secure chat group [facility] Teaching Service     Upper Level Addendum:  I have seen and evaluated this patient along with student Dr. Orlena Sheldon and reviewed the above note, making necessary revisions as appropriate.  I agree with the medical decision making  and physical exam as noted above.  Erskine Emery, MD PGY-2 [facility] Res[facility]

## 2022-08-21 NOTE — Progress Notes (Signed)
Orthopedic Tech Progress Note Patient Details:  [name] 1941-01-21 [id number] Called order into [facility] for resting hand splint Patient ID: [name], female   DOB: 1940-12-20, 81 y.o.   MRN: [id number]  [name] 08/21/2022, 12:58 PM

## 2022-08-21 NOTE — Progress Notes (Signed)
Eval pt ~5am for ongoing headache, and also epigastric pain and increased anxiety. Headache had improved slightly with toradol (9/10 from 10/10). Pt has hx of migraines for which she takes ASA for at home. Pt also has epigastric pain, has hx of heartburn. Pt having similar anxiety and claustrophobia feelings as last night.  Exam Gen: Anxious appearing woman, can't get comfortable in bed. Alert and responding appropriately. Flushing on cheeks. Abm: Tender to palpation in epigastric area.  A/P  Headaches Has hx of migraines and takes ASA at home for them. Similar to prior HA's. Slight improvement with toradol.  GERD Restarted protonix yesterday. Epigastric pain is reproduced with palpation.  - gave Pepcid  Anxiety - similar to prior claustrophobia attacks. Will hold off on Atarax due to oversedation yesterday - Supportive care. Cooling room, giving ice packs.

## 2022-08-21 NOTE — Progress Notes (Signed)
Speech Language Pathology Treatment: Dysphagia  Patient Details Name: [name] MRN: [id number] DOB: 11-21-1941 Today's Date: 08/21/2022 Time: 1745-1800 SLP Time Calculation (min) (ACUTE ONLY): 15 min  Assessment / Plan / Recommendation Clinical Impression  Patient seen by SLP for skilled treatment session focused on dysphagia goals. Patient's spouse in room and both participated in discussion about PO intake. Patient reporting that she has been drinking liquids but has not been eating much of the solid food textures. She tells SLP that she is still having difficulty with biting inside of her cheeks when chewing and this contributes to not wanting solids. In addition, however, patient mentions many other reasons why she hasn't eaten much and not all were pertinent to current situation. For example, she reported being 20 pounds overweight, being picky about some foods and wanting to eat healthy. SLP explained importance of patient getting adequate nutrition for healing and recovery. Husband did add that when patient had knee replacement surgery a few months ago, she initially was constipated and after getting medications to treat that, she then had diarrhea. Patient added that she often does not have a good appetite when she feels sick. SLP encouraged patient to try to eat more, look at menu and order some foods she might enjoy, etc. SLP asked patient if she had ever had any nutritional supplements but she stated she was lactose intolerant.  Patient may benefit from consult from dietician to discuss options to improve her overall PO intake. SLP will follow for diet toleration.    HPI HPI: Patient is a 81 y.o. female with PMH: HTN,, hypothyroidism, anxiety, seizure, shingles. She presented to [facility] ED via [facility] on 08/19/22 secondary to acute left sided weakness, lethargy, facial droop and slurred speech. CT head did not show any acute intracranial abnormality and MRI ordered but not completed yet. Of  note, MRI from previous admission in 2021 showed Small subacute infarct within the right frontal white matter. Patient was made NPO awaiting SLP swallow evaluation.      SLP Plan  Continue with current plan of care      Recommendations for follow up therapy are one component of a multi-disciplinary discharge planning process, led by the attending physician.  Recommendations may be updated based on patient status, additional functional criteria and insurance authorization.    Recommendations  Diet recommendations: Dysphagia 3 (mechanical soft);Thin liquid Liquids provided via: Cup;Straw Medication Administration: Crushed with puree Supervision: Patient able to self feed Compensations: Slow rate;Small sips/bites Postural Changes and/or Swallow Maneuvers: Seated upright 90 degrees                Oral Care Recommendations: Oral care BID Follow Up Recommendations: Follow physician's recommendations for discharge plan and follow up therapies Assistance recommended at discharge: Intermittent Supervision/Assistance SLP Visit Diagnosis: Dysphagia, unspecified (R13.10) Plan: Continue with current plan of care           Sonia Baller, MA, CCC-SLP Speech Therapy

## 2022-08-21 NOTE — Progress Notes (Signed)
IP rehab admissions - I met with patient and her spouse at the bedside.  They are interested in inpatient rehab admission.  I will contact [employer] insurance to begin precert process.  I will update all once I hear back from insurance carrier.  Call me for questions.  [phone]

## 2022-08-21 NOTE — Progress Notes (Signed)
Eval pt ~3am for 10/10 HA. BL and frontal/forehead area, similar to prior headaches. Alert and appropriately answering questions, less agitated than previous night. Per neuro note, ok to give toradol for HA. - Toradol '15mg'$  IV once for HA - Hydral given for BP 225/86

## 2022-08-21 NOTE — Assessment & Plan Note (Signed)
Patient had epigastric pain that was reproduced with palpation.  - Pepcid given 1x overnight  - protonix 40 mg

## 2022-08-22 ENCOUNTER — Inpatient Hospital Stay (HOSPITAL_COMMUNITY)
Admission: RE | Admit: 2022-08-22 | Discharge: 2022-09-05 | DRG: 057 | Disposition: A | Discharge status: Home / Self Care | Payer: Medicare HMO | Source: Intra-hospital | Attending: Physical Medicine and Rehabilitation | Admitting: Physical Medicine and Rehabilitation

## 2022-08-22 ENCOUNTER — Encounter (HOSPITAL_COMMUNITY): Payer: Self-pay | Admitting: Physical Medicine and Rehabilitation

## 2022-08-22 ENCOUNTER — Other Ambulatory Visit: Payer: Self-pay

## 2022-08-22 DIAGNOSIS — F419 Anxiety disorder, unspecified: Secondary | ICD-10-CM | POA: Diagnosis present

## 2022-08-22 DIAGNOSIS — M1712 Unilateral primary osteoarthritis, left knee: Secondary | ICD-10-CM | POA: Diagnosis not present

## 2022-08-22 DIAGNOSIS — Z7902 Long term (current) use of antithrombotics/antiplatelets: Secondary | ICD-10-CM | POA: Diagnosis not present

## 2022-08-22 DIAGNOSIS — Z79890 Hormone replacement therapy (postmenopausal): Secondary | ICD-10-CM

## 2022-08-22 DIAGNOSIS — F32A Depression, unspecified: Secondary | ICD-10-CM | POA: Diagnosis present

## 2022-08-22 DIAGNOSIS — Z79899 Other long term (current) drug therapy: Secondary | ICD-10-CM

## 2022-08-22 DIAGNOSIS — M25569 Pain in unspecified knee: Secondary | ICD-10-CM | POA: Diagnosis present

## 2022-08-22 DIAGNOSIS — Z882 Allergy status to sulfonamides status: Secondary | ICD-10-CM | POA: Diagnosis not present

## 2022-08-22 DIAGNOSIS — I69354 Hemiplegia and hemiparesis following cerebral infarction affecting left non-dominant side: Secondary | ICD-10-CM | POA: Diagnosis not present

## 2022-08-22 DIAGNOSIS — D72829 Elevated white blood cell count, unspecified: Secondary | ICD-10-CM | POA: Diagnosis present

## 2022-08-22 DIAGNOSIS — B0229 Other postherpetic nervous system involvement: Secondary | ICD-10-CM | POA: Diagnosis present

## 2022-08-22 DIAGNOSIS — Z8249 Family history of ischemic heart disease and other diseases of the circulatory system: Secondary | ICD-10-CM

## 2022-08-22 DIAGNOSIS — Z881 Allergy status to other antibiotic agents status: Secondary | ICD-10-CM

## 2022-08-22 DIAGNOSIS — Z7982 Long term (current) use of aspirin: Secondary | ICD-10-CM

## 2022-08-22 DIAGNOSIS — E785 Hyperlipidemia, unspecified: Secondary | ICD-10-CM | POA: Diagnosis not present

## 2022-08-22 DIAGNOSIS — I639 Cerebral infarction, unspecified: Secondary | ICD-10-CM | POA: Diagnosis not present

## 2022-08-22 DIAGNOSIS — I69391 Dysphagia following cerebral infarction: Secondary | ICD-10-CM | POA: Diagnosis not present

## 2022-08-22 DIAGNOSIS — Z96651 Presence of right artificial knee joint: Secondary | ICD-10-CM | POA: Diagnosis present

## 2022-08-22 DIAGNOSIS — K219 Gastro-esophageal reflux disease without esophagitis: Secondary | ICD-10-CM | POA: Diagnosis present

## 2022-08-22 DIAGNOSIS — K5900 Constipation, unspecified: Secondary | ICD-10-CM | POA: Diagnosis present

## 2022-08-22 DIAGNOSIS — I10 Essential (primary) hypertension: Secondary | ICD-10-CM | POA: Diagnosis not present

## 2022-08-22 DIAGNOSIS — Z823 Family history of stroke: Secondary | ICD-10-CM

## 2022-08-22 DIAGNOSIS — Z888 Allergy status to other drugs, medicaments and biological substances status: Secondary | ICD-10-CM | POA: Diagnosis not present

## 2022-08-22 DIAGNOSIS — Z91040 Latex allergy status: Secondary | ICD-10-CM | POA: Diagnosis not present

## 2022-08-22 DIAGNOSIS — R531 Weakness: Secondary | ICD-10-CM | POA: Diagnosis not present

## 2022-08-22 DIAGNOSIS — J45909 Unspecified asthma, uncomplicated: Secondary | ICD-10-CM | POA: Diagnosis present

## 2022-08-22 DIAGNOSIS — I6381 Other cerebral infarction due to occlusion or stenosis of small artery: Secondary | ICD-10-CM | POA: Diagnosis present

## 2022-08-22 DIAGNOSIS — R41841 Cognitive communication deficit: Secondary | ICD-10-CM | POA: Diagnosis present

## 2022-08-22 DIAGNOSIS — R29810 Facial weakness: Secondary | ICD-10-CM | POA: Diagnosis present

## 2022-08-22 DIAGNOSIS — N179 Acute kidney failure, unspecified: Secondary | ICD-10-CM | POA: Diagnosis not present

## 2022-08-22 DIAGNOSIS — E876 Hypokalemia: Secondary | ICD-10-CM | POA: Diagnosis not present

## 2022-08-22 DIAGNOSIS — R1310 Dysphagia, unspecified: Secondary | ICD-10-CM | POA: Diagnosis present

## 2022-08-22 DIAGNOSIS — E039 Hypothyroidism, unspecified: Secondary | ICD-10-CM | POA: Diagnosis not present

## 2022-08-22 MED ORDER — ALBUTEROL SULFATE (2.5 MG/3ML) 0.083% IN NEBU
2.5000 mg | INHALATION_SOLUTION | RESPIRATORY_TRACT | Status: DC | PRN
  Administered 2022-08-31: 2.5 mg via RESPIRATORY_TRACT
  Filled 2022-08-22 (×2): qty 3

## 2022-08-22 MED ORDER — PREDNISONE 10 MG PO TABS
10.0000 mg | ORAL_TABLET | Freq: Every day | ORAL | Status: DC
  Administered 2022-08-23 – 2022-09-05 (×14): 10 mg via ORAL
  Filled 2022-08-22 (×14): qty 1

## 2022-08-22 MED ORDER — ASPIRIN 81 MG PO TBEC: 81.0000 mg | DELAYED_RELEASE_TABLET | Freq: Every day | ORAL | 12 refills | Status: DC

## 2022-08-22 MED ORDER — HYDRALAZINE HCL 10 MG PO TABS: 10.0000 mg | ORAL_TABLET | Freq: Four times a day (QID) | ORAL | Status: DC | PRN

## 2022-08-22 MED ORDER — TRAZODONE HCL 50 MG PO TABS: 25.0000 mg | ORAL_TABLET | Freq: Every evening | ORAL | Status: DC | PRN

## 2022-08-22 MED ORDER — GABAPENTIN 300 MG PO CAPS
300.0000 mg | ORAL_CAPSULE | Freq: Three times a day (TID) | ORAL | Status: DC
  Administered 2022-08-22 – 2022-09-05 (×41): 300 mg via ORAL
  Filled 2022-08-22 (×41): qty 1

## 2022-08-22 MED ORDER — ATORVASTATIN CALCIUM 40 MG PO TABS
40.0000 mg | ORAL_TABLET | Freq: Every day | ORAL | Status: DC
  Administered 2022-08-25 – 2022-09-04 (×11): 40 mg via ORAL
  Filled 2022-08-22 (×12): qty 1

## 2022-08-22 MED ORDER — PROCHLORPERAZINE 25 MG RE SUPP: 12.5000 mg | Freq: Four times a day (QID) | RECTAL | Status: DC | PRN

## 2022-08-22 MED ORDER — TRAMADOL HCL 50 MG PO TABS: 50.0000 mg | ORAL_TABLET | Freq: Four times a day (QID) | ORAL | Status: DC | PRN

## 2022-08-22 MED ORDER — ACETAMINOPHEN 325 MG PO TABS: 325.0000 mg | ORAL_TABLET | ORAL | Status: DC | PRN

## 2022-08-22 MED ORDER — PROPRANOLOL HCL ER 60 MG PO CP24
60.0000 mg | ORAL_CAPSULE | Freq: Every day | ORAL | Status: DC
  Administered 2022-08-23 – 2022-09-05 (×14): 60 mg via ORAL
  Filled 2022-08-22 (×14): qty 1

## 2022-08-22 MED ORDER — GUAIFENESIN-DM 100-10 MG/5ML PO SYRP: 5.0000 mL | ORAL_SOLUTION | Freq: Four times a day (QID) | ORAL | Status: DC | PRN

## 2022-08-22 MED ORDER — CLOPIDOGREL BISULFATE 75 MG PO TABS: 75.0000 mg | ORAL_TABLET | Freq: Every day | ORAL | Status: DC

## 2022-08-22 MED ORDER — POLYETHYLENE GLYCOL 3350 17 G PO PACK: 17.0000 g | PACK | Freq: Every day | ORAL | Status: DC | PRN

## 2022-08-22 MED ORDER — FOLIC ACID 1 MG PO TABS
1.0000 mg | ORAL_TABLET | Freq: Every day | ORAL | Status: DC
  Administered 2022-08-23 – 2022-09-05 (×14): 1 mg via ORAL
  Filled 2022-08-22 (×14): qty 1

## 2022-08-22 MED ORDER — HYDRALAZINE HCL 25 MG PO TABS: 25.0000 mg | ORAL_TABLET | Freq: Four times a day (QID) | ORAL | Status: DC | PRN

## 2022-08-22 MED ORDER — LOSARTAN POTASSIUM 50 MG PO TABS
50.0000 mg | ORAL_TABLET | Freq: Every day | ORAL | Status: DC
  Administered 2022-08-23 – 2022-09-05 (×14): 50 mg via ORAL
  Filled 2022-08-22 (×14): qty 1

## 2022-08-22 MED ORDER — ENOXAPARIN SODIUM 40 MG/0.4ML IJ SOSY
40.0000 mg | PREFILLED_SYRINGE | INTRAMUSCULAR | Status: DC
  Administered 2022-08-22 – 2022-09-04 (×14): 40 mg via SUBCUTANEOUS
  Filled 2022-08-22 (×14): qty 0.4

## 2022-08-22 MED ORDER — LOSARTAN POTASSIUM 50 MG PO TABS
50.0000 mg | ORAL_TABLET | Freq: Every day | ORAL | Status: DC
  Administered 2022-08-22: 50 mg via ORAL
  Filled 2022-08-22: qty 1

## 2022-08-22 MED ORDER — ENOXAPARIN SODIUM 40 MG/0.4ML IJ SOSY: 40.0000 mg | PREFILLED_SYRINGE | INTRAMUSCULAR | Status: DC

## 2022-08-22 MED ORDER — DULOXETINE HCL 30 MG PO CPEP
30.0000 mg | ORAL_CAPSULE | Freq: Every day | ORAL | Status: DC
  Administered 2022-08-23 – 2022-09-05 (×14): 30 mg via ORAL
  Filled 2022-08-22 (×14): qty 1

## 2022-08-22 MED ORDER — POTASSIUM CHLORIDE CRYS ER 20 MEQ PO TBCR
20.0000 meq | EXTENDED_RELEASE_TABLET | Freq: Once | ORAL | Status: AC
  Administered 2022-08-22: 20 meq via ORAL
  Filled 2022-08-22: qty 1

## 2022-08-22 MED ORDER — PROCHLORPERAZINE EDISYLATE 10 MG/2ML IJ SOLN: 5.0000 mg | Freq: Four times a day (QID) | INTRAMUSCULAR | Status: DC | PRN

## 2022-08-22 MED ORDER — BISACODYL 10 MG RE SUPP: 10.0000 mg | Freq: Every day | RECTAL | Status: DC | PRN

## 2022-08-22 MED ORDER — CLOPIDOGREL BISULFATE 75 MG PO TABS
75.0000 mg | ORAL_TABLET | Freq: Every day | ORAL | Status: DC
  Administered 2022-08-23 – 2022-09-05 (×14): 75 mg via ORAL
  Filled 2022-08-22 (×14): qty 1

## 2022-08-22 MED ORDER — ASPIRIN 81 MG PO TBEC
81.0000 mg | DELAYED_RELEASE_TABLET | Freq: Every day | ORAL | Status: DC
  Administered 2022-08-23 – 2022-09-05 (×14): 81 mg via ORAL
  Filled 2022-08-22 (×14): qty 1

## 2022-08-22 MED ORDER — LEVOTHYROXINE SODIUM 50 MCG PO TABS
50.0000 ug | ORAL_TABLET | Freq: Every day | ORAL | Status: DC
  Administered 2022-08-23 – 2022-09-05 (×14): 50 ug via ORAL
  Filled 2022-08-22 (×14): qty 1

## 2022-08-22 MED ORDER — MONTELUKAST SODIUM 10 MG PO TABS
10.0000 mg | ORAL_TABLET | Freq: Every day | ORAL | Status: DC
  Administered 2022-08-22 – 2022-09-05 (×15): 10 mg via ORAL
  Filled 2022-08-22 (×15): qty 1

## 2022-08-22 MED ORDER — ALUM & MAG HYDROXIDE-SIMETH 200-200-20 MG/5ML PO SUSP
30.0000 mL | ORAL | Status: DC | PRN
  Administered 2022-09-02: 30 mL via ORAL
  Filled 2022-08-22: qty 30

## 2022-08-22 MED ORDER — PANTOPRAZOLE SODIUM 40 MG PO TBEC
40.0000 mg | DELAYED_RELEASE_TABLET | Freq: Every day | ORAL | Status: DC
  Administered 2022-08-23 – 2022-09-05 (×14): 40 mg via ORAL
  Filled 2022-08-22 (×14): qty 1

## 2022-08-22 MED ORDER — FLEET ENEMA 7-19 GM/118ML RE ENEM: 1.0000 | ENEMA | Freq: Once | RECTAL | Status: DC | PRN

## 2022-08-22 MED ORDER — PROCHLORPERAZINE MALEATE 5 MG PO TABS: 5.0000 mg | ORAL_TABLET | Freq: Four times a day (QID) | ORAL | Status: DC | PRN

## 2022-08-22 MED ORDER — ATORVASTATIN CALCIUM 40 MG PO TABS: 40.0000 mg | ORAL_TABLET | Freq: Every day | ORAL | Status: DC

## 2022-08-22 MED ORDER — DIPHENHYDRAMINE HCL 12.5 MG/5ML PO ELIX: 12.5000 mg | ORAL_SOLUTION | Freq: Four times a day (QID) | ORAL | Status: DC | PRN

## 2022-08-22 NOTE — Plan of Care (Signed)
Problem: Education: Goal: Knowledge of General Education information will improve Description: Including pain rating scale, medication(s)/side effects and non-pharmacologic comfort measures 08/22/2022 0135 by Lorna Dibble, RN Outcome: Progressing 08/22/2022 0133 by Lorna Dibble, RN Outcome: Progressing   Problem: Health Behavior/Discharge Planning: Goal: Ability to manage health-related needs will improve 08/22/2022 0135 by Lorna Dibble, RN Outcome: Progressing 08/22/2022 0133 by Lorna Dibble, RN Outcome: Progressing   Problem: Clinical Measurements: Goal: Ability to maintain clinical measurements within normal limits will improve 08/22/2022 0135 by Lorna Dibble, RN Outcome: Progressing 08/22/2022 0133 by Lorna Dibble, RN Outcome: Progressing Goal: Will remain free from infection 08/22/2022 0135 by Lorna Dibble, RN Outcome: Progressing 08/22/2022 0133 by Lorna Dibble, RN Outcome: Progressing Goal: Diagnostic test results will improve 08/22/2022 0135 by Lorna Dibble, RN Outcome: Progressing 08/22/2022 0133 by Lorna Dibble, RN Outcome: Progressing Goal: Respiratory complications will improve 08/22/2022 0135 by Lorna Dibble, RN Outcome: Progressing 08/22/2022 0133 by Lorna Dibble, RN Outcome: Progressing Goal: Cardiovascular complication will be avoided 08/22/2022 0135 by Lorna Dibble, RN Outcome: Progressing 08/22/2022 0133 by Lorna Dibble, RN Outcome: Progressing   Problem: Activity: Goal: Risk for activity intolerance will decrease 08/22/2022 0135 by Lorna Dibble, RN Outcome: Progressing 08/22/2022 0133 by Lorna Dibble, RN Outcome: Progressing   Problem: Nutrition: Goal: Adequate nutrition will be maintained 08/22/2022 0135 by Lorna Dibble, RN Outcome: Progressing 08/22/2022 0133 by Lorna Dibble, RN Outcome: Progressing   Problem:  Coping: Goal: Level of anxiety will decrease 08/22/2022 0135 by Lorna Dibble, RN Outcome: Progressing 08/22/2022 0133 by Lorna Dibble, RN Outcome: Progressing   Problem: Elimination: Goal: Will not experience complications related to bowel motility 08/22/2022 0135 by Lorna Dibble, RN Outcome: Progressing 08/22/2022 0133 by Lorna Dibble, RN Outcome: Progressing Goal: Will not experience complications related to urinary retention 08/22/2022 0135 by Lorna Dibble, RN Outcome: Progressing 08/22/2022 0133 by Lorna Dibble, RN Outcome: Progressing   Problem: Pain Managment: Goal: General experience of comfort will improve 08/22/2022 0135 by Lorna Dibble, RN Outcome: Progressing 08/22/2022 0133 by Lorna Dibble, RN Outcome: Progressing   Problem: Safety: Goal: Ability to remain free from injury will improve 08/22/2022 0135 by Lorna Dibble, RN Outcome: Progressing 08/22/2022 0133 by Lorna Dibble, RN Outcome: Progressing   Problem: Skin Integrity: Goal: Risk for impaired skin integrity will decrease 08/22/2022 0135 by Lorna Dibble, RN Outcome: Progressing 08/22/2022 0133 by Lorna Dibble, RN Outcome: Progressing   Problem: Education: Goal: Knowledge of disease or condition will improve Outcome: Progressing Goal: Knowledge of secondary prevention will improve (SELECT ALL) Outcome: Progressing Goal: Knowledge of patient specific risk factors will improve (INDIVIDUALIZE FOR PATIENT) Outcome: Progressing Goal: Individualized Educational Video(s) Outcome: Progressing   Problem: Coping: Goal: Will verbalize positive feelings about self Outcome: Progressing Goal: Will identify appropriate support needs Outcome: Progressing   Problem: Health Behavior/Discharge Planning: Goal: Ability to manage health-related needs will improve Outcome: Progressing   Problem: Self-Care: Goal: Ability to  participate in self-care as condition permits will improve Outcome: Progressing   Problem: Nutrition: Goal: Risk of aspiration will decrease Outcome: Progressing   Problem: Intracerebral Hemorrhage Tissue Perfusion: Goal: Complications of Intracerebral Hemorrhage will be minimized Outcome: Progressing   Problem: Ischemic Stroke/TIA Tissue Perfusion: Goal: Complications of ischemic stroke/TIA will be minimized Outcome: Progressing   Problem: Spontaneous Subarachnoid Hemorrhage Tissue Perfusion: Goal: Complications of Spontaneous Subarachnoid Hemorrhage will be minimized Outcome:  Progressing

## 2022-08-22 NOTE — Progress Notes (Signed)
Occupational Therapy Treatment Patient Details Name: [name] MRN: [id number] DOB: September 12, 1941 Today's Date: 08/22/2022   History of present illness Pt is a 81 y/o female admitted 9/12 with L sided weakness and aphasia.  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   OT comments  Focus session on application of resting hand splint ordered prior and to ensure proper fit. Pt with mild discomfort upon initial application and adjusting thumb placement as well as strap and pt with better tolerance. Providing pt education. Recommend wear schedule 4 hours on, 4 hours off. Wear schedule can be re-assessed per AIR therapy recommendations as pt continues to progress with therapy. Pt discharged to AIR after session.    Recommendations for follow up therapy are one component of a multi-disciplinary discharge planning process, led by the attending physician.  Recommendations may be updated based on patient status, additional functional criteria and insurance authorization.    Follow Up Recommendations  Acute inpatient rehab (3hours/day)    Assistance Recommended at Discharge Frequent or constant Supervision/Assistance  Patient can return home with the following  A lot of help with walking and/or transfers;A lot of help with bathing/dressing/bathroom;Assistance with cooking/housework;Assistance with feeding;Direct supervision/assist for medications management;Direct supervision/assist for financial management;Assist for transportation;Help with stairs or ramp for entrance   Equipment Recommendations  Other (comment) (defer)    Recommendations for Other Services Rehab consult    Precautions / Restrictions Precautions Precautions: Fall Restrictions Weight Bearing Restrictions: No       Mobility Bed Mobility                    Transfers                         Balance                                            ADL either performed or assessed with clinical judgement   ADL                                         General ADL Comments: Focus session on LUE resting hand splint application and fit.    Extremity/Trunk Assessment Upper Extremity Assessment Upper Extremity Assessment: LUE deficits/detail LUE Deficits / Details: reamins with 3-/5MMT shoulder elevation, retraction; able to actively produce hand flexion through flexion of shoulder/elbow and wrist together. LUE Sensation: WNL LUE Coordination: decreased fine motor;decreased gross motor   Lower Extremity Assessment Lower Extremity Assessment: Defer to PT evaluation        Vision       Perception     Praxis      Cognition Arousal/Alertness: Awake/alert Behavior During Therapy: WFL for tasks assessed/performed Overall Cognitive Status: Impaired/Different from baseline Area of Impairment: Attention, Problem solving, Awareness, Safety/judgement                   Current Attention Level: Sustained   Following Commands: Follows one step commands consistently, Follows one step commands with increased time Safety/Judgement: Decreased awareness of deficits, Decreased awareness of safety Awareness: Emergent Problem Solving: Slow processing, Decreased initiation, Difficulty sequencing, Requires verbal cues General Comments: Pt with good  recall of how to protect LUE to place on pillow. Pleasant and conversational        Exercises      Shoulder Instructions       General Comments Donned resting hand splint on L hadn. To be worn for 4 hours and taken off for the following 4 hours to coincide with medication schedule    Pertinent Vitals/ Pain       Pain Assessment Pain Assessment: No/denies pain  Home Living                                          Prior Functioning/Environment              Frequency  Min 2X/week         Progress Toward Goals  OT Goals(current goals can now be found in the care plan section)  Progress towards OT goals: Progressing toward goals  Acute Rehab OT Goals Patient Stated Goal: get better OT Goal Formulation: With patient Time For Goal Achievement: 09/03/22 Potential to Achieve Goals: Good ADL Goals Pt Will Perform Grooming: with set-up;sitting Pt Will Perform Upper Body Dressing: with set-up;sitting Pt Will Perform Lower Body Dressing: sit to/from stand;with supervision;sitting/lateral leans Pt Will Transfer to Toilet: with supervision;ambulating;bedside commode Pt/caregiver will Perform Home Exercise Program: Increased strength;Left upper extremity;With written HEP provided Additional ADL Goal #1: Pt will follow 3 step ADL task with increased time and no more than supervision.  Plan Discharge plan remains appropriate;Frequency remains appropriate    Co-evaluation                 AM-PAC OT "6 Clicks" Daily Activity     Outcome Measure   Help from another person eating meals?: A Lot Help from another person taking care of personal grooming?: A Lot Help from another person toileting, which includes using toliet, bedpan, or urinal?: Total Help from another person bathing (including washing, rinsing, drying)?: A Lot Help from another person to put on and taking off regular upper body clothing?: A Lot Help from another person to put on and taking off regular lower body clothing?: Total 6 Click Score: 10    End of Session    OT Visit Diagnosis: Other abnormalities of gait and mobility (R26.89);Muscle weakness (generalized) (M62.81);Hemiplegia and hemiparesis;Other symptoms and signs involving cognitive function Hemiplegia - Right/Left: Left Hemiplegia - dominant/non-dominant: Non-Dominant Hemiplegia - caused by: Cerebral infarction   Activity Tolerance     Patient Left     Nurse Communication          Time: 3953-2023 OT Time Calculation (min):  6 min  Charges: OT General Charges $OT Visit: 1 Visit  [name], [name] [facility] Acute Rehabilitation Office: [phone]   [name] 08/22/2022, 4:38 PM

## 2022-08-22 NOTE — Progress Notes (Addendum)
Daily Progress Note Intern Pager: [phone]  Patient name: [name] Medical record number: [id number] Date of birth: 1941/01/21 Age: 81 y.o. Gender: female  Primary Care Provider: Idelle Crouch, MD Consultants: Neuro Code Status: DNR/DNI  Pt Overview and Major Events to Date:  08/19/22 - Admitted to FMTS  Assessment and Plan: [name] is a 81 y.o. female presenting with acute left sided weakness, left sided facial droop, and expressive aphasia, admitted for acute stroke. Patient is medically stable for discharge.   * Stroke (HCC) Stable, slowly improving. Neuro signed off - Atorvastatin '40mg'$  daily  - DAPT with aspirin 81 and Plavix 75 mg for 3 weeks  - Med-tele cardiac monitoring   Essential hypertension Uncontrolled today.  - Per neuro, permissive hypertension (< 387 systolic) and gradually normalize in 5-7 days - Restarted home losartan, propranolol for now  - VS per floor protocol   GERD (gastroesophageal reflux disease) On second night, patient had epigastric pain that was reproduced with palpation.  - Pepcid PRN - Protonix 40 mg   Agitation On first night, team was called for patient agitation and pulling at lines. She gets claustrophobic and hot overnight.   - Keep room cool overnight  - Keep mitts in place  Leukocytosis Likely reactive vs chronic steroid use as cause, AM CBC shows still elevated but downtrending. No infectious or systemic symptoms   Positive anti-CCP test - Continuing home Prednisone 10 mg - Needs ambulatory referral to rheumatology for formal RA diagnosis and treatment upon discharge   - Bisphosphonate upon discharge due to age and chronic steroid use   Hypokalemia Monitor daily BMP, replete as necessary.  History of CVA (cerebrovascular accident) - Aspirin 81 and plavix 75 mg for 3 weeks   Asthma without status asthmaticus Albuterol PRN   Anxiety On home Cymbalta 30 mg.  Acquired hypothyroidism - Home Synthroid     FEN/GI: DYS 3 (mechanical soft), Thin liquids PPx: SCDs Dispo:CIR  pending insurance auth .   Subjective:  Patient was seen resting comfortably, asleep.   Objective: Temp:  [97.7 F (36.5 C)-98.7 F (37.1 C)] 97.8 F (36.6 C) (09/15 1159) Pulse Rate:  [60-73] 65 (09/15 1159) Resp:  [16-18] 18 (09/15 1159) BP: (188-220)/(63-91) 199/63 (09/15 1159) SpO2:  [96 %-98 %] 97 % (09/15 1159) Physical Exam: General: sleeping comfortably  Cardiovascular: RRR S1 S2 present, no rubs murmurs or gallops  Respiratory: CTAB. No increased WOB.   Laboratory: Most recent CBC Lab Results  Component Value Date   WBC 11.6 (H) 08/21/2022   HGB 12.6 08/21/2022   HCT 38.8 08/21/2022   MCV 88.6 08/21/2022   PLT 313 08/21/2022   Most recent BMP    Latest Ref Rng & Units 08/21/2022    7:09 AM  BMP  Glucose 70 - 99 mg/dL 94   BUN 8 - 23 mg/dL 7   Creatinine 0.44 - 1.00 mg/dL 0.73   Sodium 135 - 145 mmol/L 139   Potassium 3.5 - 5.1 mmol/L 3.3   Chloride 98 - 111 mmol/L 108   CO2 22 - 32 mmol/L 21   Calcium 8.9 - 10.3 mg/dL 8.8    None new   Lupita Raider, Medical Student 08/22/2022, 11:05 AM [place] Intern pager: [phone], text pages welcome Secure chat group [place] Upper-Level Resident Addendum   I have independently interviewed and examined the patient. I have discussed the  above with the original author and agree with their documentation. My edits for correction/addition/clarification are in within the document. Please see also any attending notes.   Rise Patience, DO  PGY-3, [facility] 08/22/2022 12:28 PM  [place] Service pager: [phone] (text pages welcome through [facility])

## 2022-08-22 NOTE — Discharge Summary (Signed)
[facility] Discharge Summary  Patient name: [name] Medical record number: [id number] Date of birth: [birth year] Age: 81 y.o. Gender: female Date of Admission: 08/19/2022  Date of Discharge: 08/22/2022 Admitting Physician: Erskine Emery, MD  Primary Care Provider: Idelle Crouch, MD Consultants: Neuro  Indication for Hospitalization: Stroke causing facial droop, dysarthria, and left arm weakness.  Brief Hospital Course:  LM is a 81yo F w/ hx of strokes, HTN, seizure disorder, hypothyroidism, anxiety who was admitted to [facility] for acute stroke.   Stroke Pt presented w/ 1 day hx of L sided weakness, L facial droop, and expressive aphasia. CT and CTA unremarkable. EEG showed cortical dysfunction in R hemisphere. MRI showed acute infarct in R corona radiata and small infarct in R anterior thalamus. Neuro consulted and started pt on ASA and Plavix for 3wks. Also started on atorvastatin.   Hypertension Allowed permissive HTN in setting of stroke (Goal <220/110). Home losartan and propanolol was held. Prior to discharge, home losartan and propanolol. Will need continued titration with medication to optimize blood pressure control.   Discharge Diagnoses/Problem List:  Principal Problem for Admission: Stroke  Other Problems addressed during stay:  GERD Hypokalemia  Asthma  Anxiety/Agitation Hypothyroidism   Disposition: CIR   Discharge Condition: Stable  Discharge Exam:  Vitals:   08/22/22 0730 08/22/22 1159  BP: (!) 189/66 (!) 199/63  Pulse: 64 65  Resp: 16 18  Temp: 98.7 F (37.1 C) 97.8 F (36.6 C)  SpO2: 96% 97%   Issues for Follow Up:  Patient needs referral to outpatient neurology upon discharge. Patient needs referral to rheumatology for formal rheumatoid arthritis (?) diagnosis and treatment upon discharge, in addition to evaluation of recurring rashes when patient discontinues prednisone 10 mg (which she has been taking for  years).  Outpatient bisphosphonate/DEXA scan for osteoporosis prevention due to age and chronic steroid use.  Continue to titrate blood pressure medications, slowly decreasing blood pressures to normotensive in the setting of recent stroke.    Significant Procedures: None  Significant Labs and Imaging:  Recent Labs  Lab 08/21/22 0709  WBC 11.6*  HGB 12.6  HCT 38.8  PLT 313   Recent Labs  Lab 08/21/22 0709  NA 139  K 3.3*  CL 108  CO2 21*  GLUCOSE 94  BUN 7*  CREATININE 0.73  CALCIUM 8.8*   CT Head 08/19/22 IMPRESSION: Motion limited assessment without obvious acute intracranial abnormality. MRI could provide more sensitive evaluation for acute infarct.   CT Angio Head and Neck 08/19/22 IMPRESSION: 1. No emergent large vessel occlusion. 2. Severe proximal left P2 PCA stenosis. 3. 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  MR BRAIN WO CONTRAST 08/19/22  IMPRESSION: Acute infarct in the right corona radiata. Small acute infarct in the right anterior thalamus Chronic microvascular ischemic change in the white matter and thalamus bilaterally. Chronic infarcts in the basal ganglia bilaterally.  TTE 08/20/22  IMPRESSIONS   1. Left ventricular ejection fraction, by estimation, is 55 to 60%. The  left ventricle has normal function. The left ventricle has no regional  wall motion abnormalities. There is mild left ventricular hypertrophy.  Left ventricular diastolic parameters  are consistent with Grade I diastolic dysfunction (impaired relaxation).   2. Right ventricular systolic function is normal. The right ventricular  size is normal. Tricuspid regurgitation signal is inadequate for assessing  PA pressure.   3. The mitral valve is normal in  structure. No evidence of mitral valve  regurgitation. No evidence of mitral stenosis.   4. The aortic valve is tricuspid. There is mild calcification of the  aortic  valve. Aortic valve regurgitation is not visualized. No aortic  stenosis is present.   5. The inferior vena cava is normal in size with greater than 50%  respiratory variability, suggesting right atrial pressure of 3 mmHg.     Results/Tests Pending at Time of Discharge: None  Discharge Medications:  Allergies as of 08/22/2022       Reactions   Cinobac [cinoxacin] Anaphylaxis   "My throat swelled and I stopped breathing."   Latex Dermatitis   IgE = 14 (WNL) on 04/17/2022 Pt is highly reactive to any latex products. Blisters skin. "Takes my skin off"   Other Shortness Of Breath   Muscle relaxers? Pt thinks allergic to muscle relaxers, reports stops breathing but not sure what medicine and if for sure muscle relaxer   Petrolatum Distillates [petroleum Distillate] Shortness Of Breath   Passes out    Sulfa Antibiotics Itching        Medication List     STOP taking these medications    celecoxib 200 MG capsule Commonly known as: CELEBREX   enoxaparin 40 MG/0.4ML injection Commonly known as: LOVENOX   estrogens (conjugated) 1.25 MG tablet Commonly known as: PREMARIN   traMADol 50 MG tablet Commonly known as: ULTRAM       TAKE these medications    Acidophilus Lactobacillus Caps Take 1 capsule by mouth daily.   albuterol 108 (90 Base) MCG/ACT inhaler Commonly known as: VENTOLIN HFA Inhale 2 puffs into the lungs every 4 (four) hours as needed for wheezing or shortness of breath.   aspirin EC 81 MG tablet Take 1 tablet (81 mg total) by mouth daily. Swallow whole.   atorvastatin 40 MG tablet Commonly known as: LIPITOR Take 1 tablet (40 mg total) by mouth daily.   cholecalciferol 25 MCG (1000 UNIT) tablet Commonly known as: VITAMIN D3 Take 1,000 Units by mouth daily.   clopidogrel 75 MG tablet Commonly known as: PLAVIX Take 1 tablet (75 mg total) by mouth daily.   cyanocobalamin 1000 MCG tablet Commonly known as: VITAMIN B12 Take 1,000 mcg by mouth daily.    DULoxetine 30 MG capsule Commonly known as: CYMBALTA Take 30 mg by mouth daily.   ferrous sulfate 325 (65 FE) MG tablet Take 325 mg by mouth daily.   folic acid 008 MCG tablet Commonly known as: FOLVITE Take 800 mcg by mouth daily.   gabapentin 300 MG capsule Commonly known as: NEURONTIN Take 300 mg by mouth 3 (three) times daily.   hydroxychloroquine 200 MG tablet Commonly known as: PLAQUENIL Take 200 mg by mouth daily.   levothyroxine 50 MCG tablet Commonly known as: SYNTHROID Take 50 mcg by mouth daily before breakfast.   losartan 50 MG tablet Commonly known as: COZAAR Take 50 mg by mouth daily.   montelukast 10 MG tablet Commonly known as: SINGULAIR Take 10 mg by mouth daily.   Multivitamin Gummies Adult Chew Chew 2 capsules by mouth daily.   oxyCODONE 5 MG immediate release tablet Commonly known as: Oxy IR/ROXICODONE Take 1 tablet (5 mg total) by mouth every 4 (four) hours as needed for severe pain.   pantoprazole 40 MG tablet Commonly known as: PROTONIX Take 40 mg by mouth See admin instructions. Take 40 mg daily, may take a second 40 mg dose as needed for acid reflux   predniSONE 10  MG tablet Commonly known as: DELTASONE Take 10 mg by mouth daily.   propranolol ER 60 MG 24 hr capsule Commonly known as: INDERAL LA Take 60 mg by mouth daily.   sertraline 50 MG tablet Commonly known as: ZOLOFT Take 50 mg by mouth daily.   SYSTANE OP Place 1 drop into both eyes 2 (two) times daily as needed (dry eyes).   XLEAR SINUS CARE SPRAY NA Place 1 spray into the nose daily as needed (congestion).        Discharge Instructions: Please refer to Patient Instructions section of EMR for full details.  Patient was counseled important signs and symptoms that should prompt return to medical care, changes in medications, dietary instructions, activity restrictions, and follow up appointments.   Follow-Up Appointments:   Lupita Raider, Medical  Student 08/22/2022, 11:33 AM [facility]     FPTS Upper-Level Resident Addendum   I have independently interviewed and examined the patient. I have discussed the above with the original author and agree with their documentation. My edits for correction/addition/clarification are in within the document. Please see also any attending notes.   Rise Patience, DO  PGY-3, [facility] 08/22/2022 12:58 PM  FPTS Service pager: [phone] (text pages welcome through [facility])

## 2022-08-22 NOTE — Progress Notes (Signed)
Inpatient Rehabilitation Admission Medication Review by a Pharmacist  A complete drug regimen review was completed for this patient to identify any potential clinically significant medication issues.  High Risk Drug Classes Is patient taking? Indication by Medication  Antipsychotic Yes Robitussin DM- cough Compazine- nausea/vomiting  Anticoagulant Yes Enoxaparin - DVT ppx  Antibiotic No   Opioid No   Antiplatelet Yes ASA, Plavix - stroke  Hypoglycemics/insulin No   Vasoactive Medication Yes Propranolol, losartan - HTN  Chemotherapy No   Other Yes Atorvastatin - HLD Synthroid - hypothyroid Trazodone - sleep Singulair - asthma Prednisone - RA Benadryl - itching Cymbalta - anxiety Protonix - GERD     Type of Medication Issue Identified Description of Issue Recommendation(s)  Drug Interaction(s) (clinically significant)     Duplicate Therapy     Allergy     No Medication Administration End Date     Incorrect Dose     Additional Drug Therapy Needed     Significant med changes from prior encounter (inform family/care partners about these prior to discharge).    Other  PTA meds not resumed on hospital admission: Premarin, Iron, gabapentin, Plaquenil, oxy IR Restart PTA meds when and if necessary during CIR admission or at time of discharge, if warranted    Clinically significant medication issues were identified that warrant physician communication and completion of prescribed/recommended actions by midnight of the next day:  No  Name of provider notified for urgent issues identified:   Provider Method of Notification:     Pharmacist comments:   Time spent performing this drug regimen review (minutes):  [place], PharmD, BCPS 08/22/2022 4:01 PM

## 2022-08-22 NOTE — Progress Notes (Signed)
PMR Admission Coordinator Pre-Admission Assessment  Patient: [name] is an 80 y.o., female MRN: [id number] DOB: 04/27/1941 Height: 4' 11" (149.9 cm) Weight: 53.1 kg  Insurance Information HMO: Yes    PPO:       PCP:       IPA:       80/20:       OTHER:  Gold Plus PRIMARY: [employer]      Policy#: [id number]      Subscriber: patient CM Name: Lorraine Parish       Phone#: [phone]     Fax#: [phone] Casey copeland called with approval 9/15 for admit 9/15-9/21 Pre-Cert#: [id number]      Employer: Retired Benefits:  Phone #: [phone]     Name: online at [email or web address] Eff. Date: 12/08/21     Deduct: $0      Out of Pocket Max: $3400 (met $870.49)      Life Max: N/A CIR: $295/day for days 1-6 max $1770/admission      SNF: $0 days 1-20; $196 days 21-100 Outpatient: med nec     Co-Pay: $10-$20/visit co-pay Home Health: 100%      Co-Pay: none DME: 80%     Co-Pay: 20% Providers: in network  SECONDARY:       Policy#:      Phone#:   Financial Counselor:       Phone#:   The "Data Collection Information Summary" for patients in Inpatient Rehabilitation Facilities with attached "Privacy Act Statement-Health Care Records" was provided and verbally reviewed with: Patient  Emergency Contact Information Contact Information     Name Relation Home Work Mobile   Weekly,[name] Spouse [phone]         Current Medical History  Patient Admitting Diagnosis: R CVA  History of Present Illness: A 80 y.o. female with history of knee replacement, seizure, HTN, hypothyroidism, shingles, headaches and anxiety who presents with left arm weakness, left visual field cut, dysarthria, nausea and vomiting.  Patient's symptoms began at 1330 yesterday, and she initially refused to be seen for them. Today, her husband insisted she come to the ED.  Patient has had about for episodes in the past of headache, vision disturbance and left sided weakness which have resolved on their own in about 30  minutes without intervention. She has not been seen for these episodes.  She does endorse a headache with nausea and vomiting today. Patient reports that she has had a seizure in the past with generalized shaking after a back injury.  No seizure activity was noted by patient or husband prior to her presenting symptoms.  She was noted to be quite hypertensive on arrival with SBP in the 230s.  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  Small acute infarct in the right anterior thalamus.  PT/OT/SLP evaluations completed with recommendations for acute inpatient rehab admission.  Complete NIHSS TOTAL: 10  Patient's medical record from [facility] has been reviewed by the rehabilitation admission coordinator and physician.  Past Medical History  Past Medical History:  Diagnosis Date   Anemia    Arthritis    Asthma    uses inhaler just prior to surgery to avoid attack   Back pain    from previous injury   Complication of anesthesia    has woken  up during 2 different surgery   Depression    no current issue/treatment; situation   Gallstones    GERD (gastroesophageal reflux disease)    Hiatal   up during 2 different surgery   Depression      no current issue/treatment; situation   Gallstones     GERD (gastroesophageal reflux disease)     Hiatal hernia      patient does NOT have nerve/muscle disease   History of kidney stones     HLD (hyperlipidemia)     HTN (hypertension)     Hypothyroidism     Kidney stones     Knee pain     Non-diabetic pancreatic hormone dysfunction years    pt. 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   Pancreatitis     Pneumonia     Seizures ([place])      caused by dye injected during a procedure   Shortness of breath      with exertion   Sinus problem      frequent infections/congestion   Stroke ([place]) 2021    reports having CVA in 2021 and having mini strokes before that   Thyroid disease        Has the patient had major surgery during 100 days prior to admission? No   Family History   family history includes Anesthesia problems in her father; Breast  cancer in her cousin, maternal aunt, and maternal grandmother; Heart attack in her paternal uncle; Heart attack (age of onset: 33) in her father; Heart disease in her father; Pancreatic cancer in her cousin; Stroke in her paternal grandfather.   Current Medications   Current Facility-Administered Medications:    acetaminophen (TYLENOL) tablet 1,000 mg, 1,000 mg, Oral, Q6H PRN, Arlyce Dice, MD, 1,000 mg at 08/21/22 0845   albuterol (PROVENTIL) (2.5 MG/3ML) 0.083% nebulizer solution 2.5 mg, 2.5 mg, Inhalation, Q4H PRN, Zigmund Daniel, Allee, MD, 2.5 mg at 08/21/22 0453   [START ON 08/22/2022] aspirin EC tablet 81 mg, 81 mg, Oral, Daily, Cozart, Hannah, RPH   atorvastatin (LIPITOR) tablet 40 mg, 40 mg, Oral, Daily, Maxwell, Allee, MD, 40 mg at 08/21/22 0845   clopidogrel (PLAVIX) tablet 75 mg, 75 mg, Oral, Daily, Maxwell, Allee, MD, 75 mg at 08/21/22 0845   DULoxetine (CYMBALTA) DR capsule 30 mg, 30 mg, Oral, Daily, Maxwell, Allee, MD, 30 mg at 08/21/22 0845   enoxaparin (LOVENOX) injection 40 mg, 40 mg, Subcutaneous, Q24H, Lilland, Alana, DO, 40 mg at 10/01/84 2778   folic acid (FOLVITE) tablet 1 mg, 1 mg, Oral, Daily, Maxwell, Allee, MD, 1 mg at 08/21/22 0845   levothyroxine (SYNTHROID) tablet 50 mcg, 50 mcg, Oral, QAC breakfast, Zigmund Daniel, Allee, MD, 50 mcg at 08/21/22 0555   ondansetron (ZOFRAN-ODT) disintegrating tablet 8 mg, 8 mg, Oral, Q8H PRN, Arlyce Dice, MD   pantoprazole (PROTONIX) EC tablet 40 mg, 40 mg, Oral, Daily, Maxwell, Allee, MD, 40 mg at 08/21/22 0845   predniSONE (DELTASONE) tablet 10 mg, 10 mg, Oral, Daily, Lilland, Alana, DO, 10 mg at 08/21/22 0844   [START ON 08/22/2022] propranolol ER (INDERAL LA) 24 hr capsule 60 mg, 60 mg, Oral, Daily, Zigmund Daniel, Allee, MD   Patients Current Diet:  Diet Order                  DIET DYS 3 Room service appropriate? Yes; Fluid consistency: Thin  Diet effective now                         Precautions / Restrictions Precautions Precautions:  Fall Restrictions Weight Bearing Restrictions: No    Has the patient had 2 or more falls or a fall with  stairs (with or without device)? Needed some help  Functional Cognition: Did the patient need help planning regular tasks such as shopping or remembering to take medications? Needed some help  Patient Information Are you of Hispanic, Latino/a,or Spanish origin?: A. No, not of Hispanic, Latino/a, or Spanish origin What is your race?: A. White Do you need or want an interpreter to communicate with a doctor or health care staff?: 0. No  Patient's Response To:  Health Literacy and Transportation Is the patient able to respond to health literacy and transportation needs?: Yes Health Literacy - How often do you need to have someone help you when you read instructions, pamphlets, or other written material from your doctor or pharmacy?: Never In the past 12 months, has lack of transportation kept you from medical appointments or from getting medications?: No In the past 12 months, has lack of transportation kept you from meetings, work, or from getting things needed for daily living?: No  Home Assistive Devices / Equipment Home Equipment: Shower seat - built in, Toilet riser, BSC/3in1  Prior Device Use: Indicate devices/aids used by the patient prior to current illness, exacerbation or injury? Walker  Current Functional Level Cognition  Overall Cognitive Status: Impaired/Different from baseline Current Attention Level: Sustained Orientation Level: Oriented X4 Following Commands: Follows one step commands consistently, Follows one step  commands with increased time Safety/Judgement: Decreased awareness of deficits, Decreased awareness of safety General Comments: pt with good recall of protecting L UE by placing on pillow to support, internally distracted by headache today and very restless in bed.    Extremity Assessment (includes Sensation/Coordination)  Upper Extremity Assessment: LUE deficits/detail LUE Deficits / Details: reamins with 3-/5MMT shoulder elevation, retraction; able to actively produce hand flexion through flexion of shoulder/elbow and wrist together.  Pt reports UE felt like it was not part of her last night. Reports hand continues to draw into flexion. LUE Sensation: WNL LUE Coordination: decreased fine motor, decreased gross motor  Lower Extremity Assessment: LLE deficits/detail LLE Deficits / Details: 4/5 strength grossly in quads, hamstrings, hip flexors    ADLs  Overall ADL's : Needs assistance/impaired Grooming: Minimal assistance, Sitting Upper Body Dressing : Maximal assistance, Sitting Lower Body Dressing: Total assistance, +2 for physical assistance, +2 for safety/equipment, Sit to/from stand Toilet Transfer: Moderate assistance, Minimal assistance, +2 for physical assistance, +2 for safety/equipment, Stand-pivot, Rolling walker (2 wheels) Toileting- Clothing Manipulation and Hygiene: Total assistance, +2 for physical assistance, +2 for safety/equipment, Sit to/from stand Toileting - Clothing Manipulation Details (indicate cue type and reason): incontient bladder in recliner, total assist for hygiene and changing clothing Functional mobility during ADLs: Minimal assistance, +2 for safety/equipment, +2 for physical assistance, Moderate assistance General ADL Comments: focused on L UE today    Mobility  Overal bed mobility: Needs Assistance Bed Mobility: Rolling, Sidelying to Sit Rolling: Min assist Sidelying to sit: Mod assist General bed mobility comments: cueing for technique, support for  trunk and scooting forward    Transfers  Overall transfer level: Needs assistance Equipment used: Rolling walker (2 wheels) Transfers: Sit to/from Stand, Bed to chair/wheelchair/BSC Sit to Stand: Mod assist, Min assist, +2 physical assistance, +2 safety/equipment Bed to/from chair/wheelchair/BSC transfer type:: Step pivot Stand pivot transfers: Min assist, +2 physical assistance, +2 safety/equipment General transfer comment: from EOB mod assist +2, min from recliner; cueing for hand placement, safety and technique. preference for L lateral lean and assist for placement/holding RW with L hand.    Ambulation / Gait / Stairs /   Wheelchair Mobility  Ambulation/Gait General Gait Details: Unable to progress to gait training at this time.    Posture / Balance Dynamic Sitting Balance Sitting balance - Comments: lateral L lean, able to correct to midline with cueing. Balance Overall balance assessment: Needs assistance Sitting-balance support: Feet supported, Single extremity supported Sitting balance-Leahy Scale: Poor Sitting balance - Comments: lateral L lean, able to correct to midline with cueing. Postural control: Left lateral lean Standing balance support: During functional activity, Single extremity supported, Bilateral upper extremity supported Standing balance-Leahy Scale: Poor Standing balance comment: relies on UE and external support    Special needs/care consideration Special service needs none    Previous Home Environment (from acute therapy documentation) Living Arrangements: Spouse/significant other Available Help at Discharge: Family, Available 24 hours/day Type of Home: House Home Layout: Two level, Able to live on main level with bedroom/bathroom Home Access: Stairs to enter Entrance Stairs-Rails: Right, Left Entrance Stairs-Number of Steps: 7 (3 steps front, no rail) Bathroom Shower/Tub: Walk-in shower Bathroom Toilet: Handicapped height Additional Comments: Owns  3WW  Discharge Living Setting Plans for Discharge Living Setting: Patient's home, House, Lives with (comment) (Lives with husband.) Type of Home at Discharge: House Discharge Home Layout: Two level, Able to live on main level with bedroom/bathroom Alternate Level Stairs-Number of Steps: Flight Discharge Home Access: Stairs to enter Entrance Stairs-Rails: Right, Left, Can reach both Entrance Stairs-Number of Steps: 6 steps at the back with rails; front with 3 steps but no rails Discharge Bathroom Shower/Tub: Walk-in shower, Door Discharge Bathroom Toilet: Standard Discharge Bathroom Accessibility: Yes How Accessible: Accessible via walker  Social/Family/Support Systems Patient Roles: Spouse (Lives with spouse.) Contact Information: David Coiner - spouse Anticipated Caregiver: husband Anticipated Caregiver's Contact Information: David - husband - [phone] Ability/Limitations of Caregiver: Husband can assist as needed. Caregiver Availability: 24/7 Discharge Plan Discussed with Primary Caregiver: Yes Is Caregiver In Agreement with Plan?: Yes Does Caregiver/Family have Issues with Lodging/Transportation while Pt is in Rehab?: No  Goals Patient/Family Goal for Rehab: PT/OT/SLP supervision to minguard assistance Expected length of stay: 7-10 days Pt/Family Agrees to Admission and willing to participate: Yes Program Orientation Provided & Reviewed with Pt/Caregiver Including Roles  & Responsibilities: Yes  Decrease burden of Care through IP rehab admission: N/A  Possible need for SNF placement upon discharge: Not planned  Patient Condition: I have reviewed medical records from , spoken with CM, and patient and spouse. I met with patient at the bedside and spoke to husband by telephone for inpatient rehabilitation assessment.  Patient will benefit from ongoing PT, OT, and SLP, can actively participate in 3 hours of therapy a day 5 days of the week, and can make measurable gains  during the admission.  Patient will also benefit from the coordinated team approach during an Inpatient Acute Rehabilitation admission.  The patient will receive intensive therapy as well as Rehabilitation physician, nursing, social worker, and care management interventions.  Due to bladder management, bowel management, safety, skin/wound care, disease management, medication administration, pain management, and patient education the patient requires 24 hour a day rehabilitation nursing.  The patient is currently Min-mod A with mobility and basic ADLs.  Discharge setting and therapy post discharge at home with home health is anticipated.  Patient has agreed to participate in the Acute Inpatient Rehabilitation Program and will admit today.  Preadmission Screen Completed By:  Eugenia M Logue, 08/21/2022 2:44 PM ______________________________________________________________________   Discussed status with Dr. Engler  on 08/22/22 at 930 and received approval for   Due to bladder management, bowel management, safety, skin/wound care, disease management, medication administration, pain management, and patient education the patient requires 24 hour a day rehabilitation nursing.  The patient is currently Min-mod A with mobility and basic ADLs.  Discharge setting and therapy post discharge at home with home health is anticipated.  Patient has agreed to participate in the Acute Inpatient Rehabilitation Program and will admit today.   Preadmission Screen Completed By:  Retta Diones, 08/21/2022 2:44 PM ______________________________________________________________________   Discussed status with Dr. Tressa Busman  on 08/22/22 at 11 and received approval for admission today.   Admission Coordinator:  Retta Diones, RN, time 1318/Date 08/22/22    Assessment/Plan: Diagnosis: Does the need for close, 24 hr/day Medical supervision in concert with the patient's rehab needs make it unreasonable for this patient to be served in a less intensive setting? Yes Co-Morbidities requiring supervision/potential complications: S/P cva with severe hypertension, hypokalemia, agitation Due to bladder management, bowel management, safety, disease management, medication administration, and patient education, does the patient require 24 hr/day rehab nursing? Yes Does the patient require coordinated care of a physician, rehab nurse, PT, OT, and SLP to address physical and functional deficits in the context of the above medical diagnosis(es)? Yes Addressing deficits in the following areas: balance, endurance, locomotion,  strength, transferring, bowel/bladder control, bathing, dressing, feeding, grooming, toileting, cognition, and swallowing Can the patient actively participate in an intensive therapy program of at least 3 hrs of therapy 5 days a week? Yes The potential for patient to make measurable gains while on inpatient rehab is excellent Anticipated functional outcomes upon discharge from inpatient rehab: modified independent and supervision PT, modified independent and supervision OT, independent and modified independent SLP Estimated rehab length of stay to reach the above functional goals is: 10-14 days Anticipated discharge destination: Home 10. Overall Rehab/Functional Prognosis: excellent     MD Signature:   Gertie Gowda, DO 08/22/2022

## 2022-08-22 NOTE — H&P (Signed)
Expand All Collapse All      Physical Medicine and Rehabilitation Admission H&P        Chief Complaint  Patient presents with   Stroke with functional deficits.       HPI: [name] is a 81 year old female with history of HTN, seizures, positive anti-CCP test, anxiety d/o, ,mini strokes; who was admitted on 08/19/22 with acute onset of LUE weakness and expressive deficits. 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 EEG negative for seizures. 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  2D echo showed EF 55-60% with mild LVH and no wall abnormality. 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 30 day event monitor recommended at discharge. Acute on chronic HA managed with use of toradol and bouts of agitation due to claustrophobia noted.  PT/OT/ST have been working with patient who continues to be limited by left sided weakness with left lean, headaches and ambulating short distances with mod assist. CIR recommended due to functional decline.      Review of Systems  Constitutional:  Negative for chills and fever.  HENT:  Negative for hearing loss.   Eyes:  Negative for blurred vision and double vision.  Respiratory:  Positive for shortness of breath (with activity and limited endurance for years). Negative for cough.   Cardiovascular:  Positive for palpitations (with activity "whole life"). Negative for chest pain.  Gastrointestinal:  Positive for constipation. Negative for heartburn and nausea.  Musculoskeletal:  Positive for back pain and joint pain (hip pain). Negative for myalgias.  Neurological:  Positive for tremors (RUE tremors with decline in use for past 5-6 months), sensory change, speech change, weakness and  headaches. Negative for dizziness.  Psychiatric/Behavioral:  The patient has insomnia. The patient is not nervous/anxious.           Past Medical History:  Diagnosis Date   Anemia     Arthritis     Asthma      uses inhaler just prior to surgery to avoid attack   Back pain      from previous injury   Complication of anesthesia      has woken  up during 2 different surgery   Depression      no current issue/treatment; situation   Gallstones     GERD (gastroesophageal reflux disease)     Hiatal hernia      patient does NOT have nerve/muscle disease   History of kidney stones     HLD (hyperlipidemia)     HTN (hypertension)     Hypothyroidism     Kidney stones     Knee pain     Non-diabetic pancreatic hormone dysfunction years    pt. 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   Pancreatitis     Pneumonia     Seizures ([place])      caused by dye injected during a procedure   Shortness of breath      with exertion   Sinus problem      frequent infections/congestion   Stroke ([place]) 2021    reports having CVA in 2021 and having mini strokes before that   Thyroid disease  Past Surgical History:  Procedure Laterality Date   ABDOMINAL HYSTERECTOMY       APPENDECTOMY       CARPAL TUNNEL RELEASE   10+ years ago    bilateral   EYE SURGERY   3 yrs ago    bilateral cataracts   FOOT OSTEOTOMY   6 weeks ago    Left foot: great, 2nd & 3rd   FOOT OSTEOTOMY   5 years ago    Right great toe   HAND SURGERY Bilateral 2011-most recent    multiple hand surgeries, 2 on left, 3 on right   KNEE ARTHROPLASTY Right 04/28/2022    Procedure: COMPUTER ASSISTED TOTAL KNEE ARTHROPLASTY;  Surgeon: Dereck Leep, MD;  Location: [facility];  Service: Orthopedics;  Laterality: Right;   LOOP RECORDER INSERTION N/A 05/16/2020    Procedure: LOOP RECORDER INSERTION;  Surgeon: Isaias Cowman, MD;  Location: [facility];  Service: Cardiovascular;  Laterality: N/A;   NASAL SINUS  SURGERY   most recent 7-8 yrs ago    7 sinus surgeries    TRIGGER FINGER RELEASE   11/19/2011    Procedure: RELEASE TRIGGER FINGER/A-1 PULLEY;  Surgeon: Wynonia Sours, MD;  Location: [facility];  Service: Orthopedics;  Laterality: Right;  release a-1 pulley right index finger and cyst removal   WRIST GANGLION EXCISION   1980's    right           Family History  Problem Relation Age of Onset   Anesthesia problems Father          "bad lungs" couldn't wake him up   Heart disease Father     Heart attack Father 46   Breast cancer Maternal Grandmother     Breast cancer Maternal Aunt          x 2   Breast cancer Cousin     Pancreatic cancer Cousin     Heart attack Paternal Uncle     Stroke Paternal Grandfather        Social History:  Married. 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 She reports that she has never smoked. She has never used smokeless tobacco. She reports that she drinks brandy twice year. She reports that she does not use drugs.          Allergies  Allergen Reactions   Cinobac [Cinoxacin] Anaphylaxis      "My throat swelled and I stopped breathing."   Latex Dermatitis      IgE = 14 (WNL) on 04/17/2022 Pt is highly reactive to any latex products. Blisters skin. "Takes my skin off"   Other Shortness Of Breath      Muscle relaxers? Pt thinks allergic to muscle relaxers, reports stops breathing but not sure what medicine and if for sure muscle relaxer   Petrolatum Distillates [Petroleum Distillate] Shortness Of Breath      Passes out    Sulfa Antibiotics Itching   Nylon Itching            Medications Prior to Admission  Medication Sig Dispense Refill   Acidophilus Lactobacillus CAPS Take 1 capsule by mouth daily.       albuterol (PROVENTIL HFA;VENTOLIN HFA) 108 (90 Base) MCG/ACT inhaler Inhale 2 puffs into the lungs every 4 (four) hours as needed for wheezing or shortness of breath.        aspirin EC 81 MG tablet Take 1 tablet (81 mg total)  by mouth daily. Swallow whole. [place]  tablet 12   atorvastatin (LIPITOR) 40 MG tablet Take 1 tablet (40 mg total) by mouth daily.       cholecalciferol (VITAMIN D3) 25 MCG (1000 UNIT) tablet Take 1,000 Units by mouth daily.       clopidogrel (PLAVIX) 75 MG tablet Take 1 tablet (75 mg total) by mouth daily.       DULoxetine (CYMBALTA) 30 MG capsule Take 30 mg by mouth daily.       ferrous sulfate 325 (65 FE) MG tablet Take 325 mg by mouth daily.       folic acid (FOLVITE) 976 MCG tablet Take 800 mcg by mouth daily.       gabapentin (NEURONTIN) 300 MG capsule Take 300 mg by mouth 3 (three) times daily.       hydroxychloroquine (PLAQUENIL) 200 MG tablet Take 200 mg by mouth daily.       levothyroxine (SYNTHROID, LEVOTHROID) 50 MCG tablet Take 50 mcg by mouth daily before breakfast.       losartan (COZAAR) 50 MG tablet Take 50 mg by mouth daily.       montelukast (SINGULAIR) 10 MG tablet Take 10 mg by mouth daily.        Multiple Vitamins-Minerals (MULTIVITAMIN GUMMIES ADULT) CHEW Chew 2 capsules by mouth daily.       oxyCODONE (OXY IR/ROXICODONE) 5 MG immediate release tablet Take 1 tablet (5 mg total) by mouth every 4 (four) hours as needed for severe pain. 30 tablet 0   pantoprazole (PROTONIX) 40 MG tablet Take 40 mg by mouth See admin instructions. Take 40 mg daily, may take a second 40 mg dose as needed for acid reflux       Polyethyl Glycol-Propyl Glycol (SYSTANE OP) Place 1 drop into both eyes 2 (two) times daily as needed (dry eyes).       predniSONE (DELTASONE) 10 MG tablet Take 10 mg by mouth daily.       propranolol ER (INDERAL LA) 60 MG 24 hr capsule Take 60 mg by mouth daily.       sertraline (ZOLOFT) 50 MG tablet Take 50 mg by mouth daily.       Sodium Chloride-Xylitol (XLEAR SINUS CARE SPRAY NA) Place 1 spray into the nose daily as needed (congestion).       vitamin B-12 (CYANOCOBALAMIN) 1000 MCG tablet Take 1,000 mcg by mouth daily.              Home: Home Living Family/patient  expects to be discharged to:: Private residence Living Arrangements: Spouse/significant other Available Help at Discharge: Family, Available 24 hours/day Type of Home: House Home Access: Stairs to enter [employer] of Steps: 7 (3 steps front, no rail) Entrance Stairs-Rails: Right, Left Home Layout: Two level, Able to live on main level with bedroom/bathroom Bathroom Shower/Tub: Multimedia programmer: Handicapped height Home Equipment: [place] in, Geneticist, molecular, BSC/3in1 Additional Comments: Owns 3WW   Functional History: Prior Function Prior Level of Function : Independent/Modified Independent Mobility Comments: using 3WW ADLs Comments: independent ADLs   Functional Status:  Mobility: Bed Mobility Overal bed mobility: Needs Assistance Bed Mobility: Rolling, Sidelying to Sit Rolling: Min assist Sidelying to sit: Mod assist General bed mobility comments: cueing for technique, support for trunk and scooting forward Transfers Overall transfer level: Needs assistance Equipment used: Rolling walker (2 wheels) Transfers: Sit to/from Stand, Bed to chair/wheelchair/BSC Sit to Stand: Mod assist, Min assist, +2 physical assistance, +2 safety/equipment Bed to/from chair/wheelchair/BSC transfer type:: Step  pivot Stand pivot transfers: Min assist, +2 physical assistance, +2 safety/equipment General transfer comment: from EOB mod assist +2, min from recliner; cueing for hand placement, safety and technique. preference for L lateral lean and assist for placement/holding RW with L hand. Ambulation/Gait General Gait Details: Unable to progress to gait training at this time.   ADL: ADL Overall ADL's : Needs assistance/impaired Grooming: Minimal assistance, Sitting Upper Body Dressing : Maximal assistance, Sitting Lower Body Dressing: Total assistance, +2 for physical assistance, +2 for safety/equipment, Sit to/from stand Toilet Transfer: Moderate assistance,  Minimal assistance, +2 for physical assistance, +2 for safety/equipment, Stand-pivot, Rolling walker (2 wheels) Toileting- Clothing Manipulation and Hygiene: Total assistance, +2 for physical assistance, +2 for safety/equipment, Sit to/from stand Toileting - Clothing Manipulation Details (indicate cue type and reason): incontient bladder in recliner, total assist for hygiene and changing clothing Functional mobility during ADLs: Minimal assistance, +2 for safety/equipment, +2 for physical assistance, Moderate assistance General ADL Comments: Focus session on LUE resting hand splint application and fit.   Cognition: Cognition Overall Cognitive Status: Impaired/Different from baseline Orientation Level: Oriented to person, Disoriented to time, Disoriented to place, Disoriented to situation (expressive aphasia) Cognition Arousal/Alertness: Awake/alert Behavior During Therapy: WFL for tasks assessed/performed Overall Cognitive Status: Impaired/Different from baseline Area of Impairment: Attention, Problem solving, Awareness, Safety/judgement Current Attention Level: Sustained Following Commands: Follows one step commands consistently, Follows one step commands with increased time Safety/Judgement: Decreased awareness of deficits, Decreased awareness of safety Awareness: Emergent Problem Solving: Slow processing, Decreased initiation, Difficulty sequencing, Requires verbal cues General Comments: Pt with good recall of how to protect LUE to place on pillow. Pleasant and conversational     Blood pressure (!) 199/63, pulse 65, temperature 97.8 F (36.6 C), temperature source Oral, resp. rate 18, height '4\' 11"'$  (1.499 m), weight 53.1 kg, SpO2 97 %. Physical Exam Vitals and nursing note reviewed.   Constitutional: No apparent distress. Appropriate appearance for age.  HENT: No JVD. Neck Supple. Trachea midline. Atraumatic, normocephalic. + scleral icterus Eyes: PERRLA. EOMI. Visual fields grossly  intact.  Cardiovascular: RRR, no murmurs/rub/gallops. No Edema. Peripheral pulses 2+  Respiratory: CTAB. No rales, rhonchi, or wheezing. On RA.  Abdomen: + bowel sounds, normoactive. No distention or tenderness.  GU: Not examined.  Skin: Skin is warm and dry.     Comments: Paper thin with multiple ecchymotic areas LUE>RUE.   MSK:      No apparent deformity.      Strength:                LUE: 3/5 SA, 2/5 EF, 2/5 EE, 0/5 WE, 0/5 FF, 0/5 FA                 RUE: 5/5 SA, 5/5 EF, 5/5 EE, 5/5 WE, 5/5 FF, 5/5 FA                 RLE: 5/5 HF, 5/5 KE, 5/5 DF, 5/5 EHL, 5/5 PF                 LLE:  5/5 HF, 5/5 KE, 5/5 DF, 5/5 EHL, 5/5 PF   Neurologic exam:  Cognition: AAO to person, place, time and event. Cannot perform addition or spell WORLD backwards.  Language: Fluent, No substitutions or neoglisms. Mild dysarthria. Names 3/3 objects correctly.  Memory: Recalls 3/3 objects at 5 minutes.  Insight: Good insight into current condition.  Mood: Anxious mood.  Sensation: To light touch intact in BL UEs and LEs  Reflexes:  2+ in BL UE and LEs. Negative Hoffman's and babinski signs bilaterally.  CN: + L facial droop Coordination: No apparent tremors. Spasticity: MAS 0 in all extremities.           Lab Results Last 48 Hours        Results for orders placed or performed during the hospital encounter of 08/19/22 (from the past 48 hour(s))  Basic metabolic panel     Status: Abnormal    Collection Time: 08/21/22  7:09 AM  Result Value Ref Range    Sodium 139 135 - 145 mmol/L    Potassium 3.3 (L) 3.5 - 5.1 mmol/L    Chloride 108 98 - 111 mmol/L    CO2 21 (L) 22 - 32 mmol/L    Glucose, Bld 94 70 - 99 mg/dL      Comment: Glucose reference range applies only to samples taken after fasting for at least 8 hours.    BUN 7 (L) 8 - 23 mg/dL    Creatinine, Ser 0.73 0.44 - 1.00 mg/dL    Calcium 8.8 (L) 8.9 - 10.3 mg/dL    GFR, Estimated >60 >60 mL/min      Comment: (NOTE) Calculated using the CKD-EPI  Creatinine Equation (2021)      Anion gap 10 5 - 15      Comment: Performed at [facility] [address], [place], [place] [address]  CBC     Status: Abnormal    Collection Time: 08/21/22  7:09 AM  Result Value Ref Range    WBC 11.6 (H) 4.0 - 10.5 K/uL    RBC 4.38 3.87 - 5.11 MIL/uL    Hemoglobin 12.6 12.0 - 15.0 g/dL    HCT 38.8 36.0 - 46.0 %    MCV 88.6 80.0 - 100.0 fL    MCH 28.8 26.0 - 34.0 pg    MCHC 32.5 30.0 - 36.0 g/dL    RDW 15.6 (H) 11.5 - 15.5 %    Platelets 313 150 - 400 K/uL    nRBC 0.0 0.0 - 0.2 %      Comment: Performed at [facility], [place] [address], [place], [place] [address]      Imaging Results (Last 48 hours)  No results found.         Blood pressure (!) 199/63, pulse 65, temperature 97.8 F (36.6 C), temperature source Oral, resp. rate 18, height '4\' 11"'$  (1.499 m), weight 53.1 kg, SpO2 97 %.   Medical Problem List and Plan: 1. 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  Antithrombotics: -DVT/anticoagulation:  Pharmaceutical: Lovenox             -antiplatelet therapy: DAPT X 3 weeks followed by ASA alone.  3. Post herpetic neuralgia/Pain Management: Was on gabapentin for left facial and scalp pain.  --will resume gabapentin. .  4. Mood/Behavior/Sleep: LCSW to follow for evaluation and support.              -antipsychotic agents: N/A 5. Neuropsych/cognition: This patient is capable of making decisions on her own behalf. 6. Skin/Wound Care: Routine pressure relief measures.  7. Fluids/Electrolytes/Nutrition: Monitor I/O. Check CMET in am.  8. HTN: Monitor BP TID. Continue inderal and Cozaar.             -  Per Neurology notes, permissive up to 220/110, target <200 and gradually decrease over 5-7 days (target date for normal 9/19)            - Hydralazine PRN 10 mg for SBP >220  9. H/o occasional Headaches: Used ASA at home prn. . --continue  Inderal. Resume tramadol prn.  10. H/o anxiety d/o: Continue Cymbalta.  11. Hypokalemia: Likely dilutional from IVF--will supplement today X 1  --recheck BMET in am.  12. Leucocytosis: Resolving 16.8-->11.6 and question reactive --Has had issues with elevation in the past-->chronic?  13. 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 H/o Asthma: Resume Singulair. Albuterol MDI prn as rescue.   15. Dysphagia: Multifactorial due to facial weakness, numbness? difficulty breathing due to sinus surgeries etc             --pt and husband would like to try D2 diet.              -SLP consult      Bary Leriche, PA-C 08/22/2022   I have examined the patient independently and edited the note for HPI, ROS, exam, assessment, and plan as appropriate. I am in agreement with the above recommendations.   Gertie Gowda, DO 08/22/2022

## 2022-08-22 NOTE — Plan of Care (Signed)
Problem: Education: Goal: Knowledge of General Education information will improve Description: Including pain rating scale, medication(s)/side effects and non-pharmacologic comfort measures 08/22/2022 1509 by Duncan Dull, RN Outcome: Adequate for Discharge 08/22/2022 1114 by Anani Gu, Rollen Sox, RN Outcome: Progressing   Problem: Health Behavior/Discharge Planning: Goal: Ability to manage health-related needs will improve 08/22/2022 1509 by Oshae Simmering, Rollen Sox, RN Outcome: Adequate for Discharge 08/22/2022 1114 by Kaylon Hitz, Rollen Sox, RN Outcome: Progressing   Problem: Clinical Measurements: Goal: Ability to maintain clinical measurements within normal limits will improve 08/22/2022 1509 by Jadda Hunsucker, Rollen Sox, RN Outcome: Adequate for Discharge 08/22/2022 1114 by Aisling Emigh, Rollen Sox, RN Outcome: Progressing Goal: Will remain free from infection 08/22/2022 1509 by Renzo Vincelette, Rollen Sox, RN Outcome: Adequate for Discharge 08/22/2022 1114 by Kannon Baum, Rollen Sox, RN Outcome: Progressing Goal: Diagnostic test results will improve 08/22/2022 1509 by Mychael Soots, Rollen Sox, RN Outcome: Adequate for Discharge 08/22/2022 1114 by Damon Baisch, Rollen Sox, RN Outcome: Progressing Goal: Respiratory complications will improve 08/22/2022 1509 by Duncan Dull, RN Outcome: Adequate for Discharge 08/22/2022 1114 by Suhey Radford, Rollen Sox, RN Outcome: Progressing Goal: Cardiovascular complication will be avoided 08/22/2022 1509 by Serrena Linderman, Rollen Sox, RN Outcome: Adequate for Discharge 08/22/2022 1114 by Caldonia Leap, Rollen Sox, RN Outcome: Progressing   Problem: Activity: Goal: Risk for activity intolerance will decrease 08/22/2022 1509 by Merie Wulf, Rollen Sox, RN Outcome: Adequate for Discharge 08/22/2022 1114 by Idaly Verret, Rollen Sox, RN Outcome: Progressing   Problem: Nutrition: Goal: Adequate nutrition will be maintained 08/22/2022 1509 by Quintavius Niebuhr, Rollen Sox, RN Outcome: Adequate for Discharge 08/22/2022 1114 by  Mairi Stagliano, Rollen Sox, RN Outcome: Progressing   Problem: Coping: Goal: Level of anxiety will decrease 08/22/2022 1509 by Floyde Dingley, Rollen Sox, RN Outcome: Adequate for Discharge 08/22/2022 1114 by Stepan Verrette, Rollen Sox, RN Outcome: Progressing   Problem: Elimination: Goal: Will not experience complications related to bowel motility 08/22/2022 1509 by Jessenya Berdan, Rollen Sox, RN Outcome: Adequate for Discharge 08/22/2022 1114 by Katriana Dortch, Rollen Sox, RN Outcome: Progressing Goal: Will not experience complications related to urinary retention 08/22/2022 1509 by Janelle Culton, Rollen Sox, RN Outcome: Adequate for Discharge 08/22/2022 1114 by Dalphine Cowie, Rollen Sox, RN Outcome: Progressing   Problem: Pain Managment: Goal: General experience of comfort will improve 08/22/2022 1509 by Jacere Pangborn, Rollen Sox, RN Outcome: Adequate for Discharge 08/22/2022 1114 by Jolina Symonds, Rollen Sox, RN Outcome: Progressing   Problem: Safety: Goal: Ability to remain free from injury will improve 08/22/2022 1509 by Amauris Debois, Rollen Sox, RN Outcome: Adequate for Discharge 08/22/2022 1114 by Josehua Hammar, Rollen Sox, RN Outcome: Progressing   Problem: Skin Integrity: Goal: Risk for impaired skin integrity will decrease 08/22/2022 1509 by Oz Gammel, Rollen Sox, RN Outcome: Adequate for Discharge 08/22/2022 1114 by Leven Hoel, Rollen Sox, RN Outcome: Progressing   Problem: Education: Goal: Knowledge of disease or condition will improve 08/22/2022 1509 by Alexina Niccoli, Rollen Sox, RN Outcome: Adequate for Discharge 08/22/2022 1114 by Kachina Niederer, Rollen Sox, RN Outcome: Progressing Goal: Knowledge of secondary prevention will improve (SELECT ALL) 08/22/2022 1509 by Duncan Dull, RN Outcome: Adequate for Discharge 08/22/2022 1114 by Courtany Mcmurphy, Rollen Sox, RN Outcome: Progressing Goal: Knowledge of patient specific risk factors will improve (INDIVIDUALIZE FOR PATIENT) 08/22/2022 1509 by Duncan Dull, RN Outcome: Adequate for Discharge 08/22/2022 1114 by  Duncan Dull, RN Outcome: Progressing Goal: Individualized Educational Video(s) 08/22/2022 1509 by Duncan Dull, RN Outcome: Adequate for Discharge 08/22/2022 1114 by Harmonii Karle, Rollen Sox, RN Outcome: Progressing   Problem: Coping: Goal: Will verbalize positive feelings about self 08/22/2022 1509 by Josie Dixon  L, RN Outcome: Adequate for Discharge 08/22/2022 1114 by Isabelle Matt, Rollen Sox, RN Outcome: Progressing Goal: Will identify appropriate support needs 08/22/2022 1509 by Aanchal Cope, Rollen Sox, RN Outcome: Adequate for Discharge 08/22/2022 1114 by Bodhi Stenglein, Rollen Sox, RN Outcome: Progressing   Problem: Health Behavior/Discharge Planning: Goal: Ability to manage health-related needs will improve 08/22/2022 1509 by Jhair Witherington, Rollen Sox, RN Outcome: Adequate for Discharge 08/22/2022 1114 by Trenton Passow, Rollen Sox, RN Outcome: Progressing   Problem: Self-Care: Goal: Ability to participate in self-care as condition permits will improve 08/22/2022 1509 by Chaquita Basques, Rollen Sox, RN Outcome: Adequate for Discharge 08/22/2022 1114 by Daliya Parchment, Rollen Sox, RN Outcome: Progressing   Problem: Nutrition: Goal: Risk of aspiration will decrease 08/22/2022 1509 by Jazmeen Axtell, Rollen Sox, RN Outcome: Adequate for Discharge 08/22/2022 1114 by Angele Wiemann, Rollen Sox, RN Outcome: Progressing   Problem: Intracerebral Hemorrhage Tissue Perfusion: Goal: Complications of Intracerebral Hemorrhage will be minimized 08/22/2022 1509 by Jovann Luse, Rollen Sox, RN Outcome: Adequate for Discharge 08/22/2022 1114 by Clorene Nerio, Rollen Sox, RN Outcome: Progressing   Problem: Ischemic Stroke/TIA Tissue Perfusion: Goal: Complications of ischemic stroke/TIA will be minimized 08/22/2022 1509 by Sherilyn Windhorst, Rollen Sox, RN Outcome: Adequate for Discharge 08/22/2022 1114 by Shawnice Tilmon, Rollen Sox, RN Outcome: Progressing   Problem: Spontaneous Subarachnoid Hemorrhage Tissue Perfusion: Goal: Complications of Spontaneous Subarachnoid  Hemorrhage will be minimized 08/22/2022 1509 by Kessie Croston, Rollen Sox, RN Outcome: Adequate for Discharge 08/22/2022 1114 by Tu Shimmel, Rollen Sox, RN Outcome: Progressing

## 2022-08-22 NOTE — Plan of Care (Signed)
  Problem: Education: Goal: Knowledge of General Education information will improve Description: Including pain rating scale, medication(s)/side effects and non-pharmacologic comfort measures Outcome: Progressing   Problem: Health Behavior/Discharge Planning: Goal: Ability to manage health-related needs will improve Outcome: Progressing   Problem: Clinical Measurements: Goal: Ability to maintain clinical measurements within normal limits will improve Outcome: Progressing Goal: Will remain free from infection Outcome: Progressing Goal: Diagnostic test results will improve Outcome: Progressing Goal: Respiratory complications will improve Outcome: Progressing Goal: Cardiovascular complication will be avoided Outcome: Progressing   Problem: Activity: Goal: Risk for activity intolerance will decrease Outcome: Progressing   Problem: Nutrition: Goal: Adequate nutrition will be maintained Outcome: Progressing   Problem: Coping: Goal: Level of anxiety will decrease Outcome: Progressing   Problem: Elimination: Goal: Will not experience complications related to bowel motility Outcome: Progressing Goal: Will not experience complications related to urinary retention Outcome: Progressing   Problem: Pain Managment: Goal: General experience of comfort will improve Outcome: Progressing   Problem: Safety: Goal: Ability to remain free from injury will improve Outcome: Progressing   Problem: Skin Integrity: Goal: Risk for impaired skin integrity will decrease Outcome: Progressing   Problem: Education: Goal: Knowledge of disease or condition will improve Outcome: Progressing Goal: Knowledge of secondary prevention will improve (SELECT ALL) Outcome: Progressing Goal: Knowledge of patient specific risk factors will improve (INDIVIDUALIZE FOR PATIENT) Outcome: Progressing Goal: Individualized Educational Video(s) Outcome: Progressing   Problem: Coping: Goal: Will verbalize  positive feelings about self Outcome: Progressing Goal: Will identify appropriate support needs Outcome: Progressing   Problem: Health Behavior/Discharge Planning: Goal: Ability to manage health-related needs will improve Outcome: Progressing   Problem: Self-Care: Goal: Ability to participate in self-care as condition permits will improve Outcome: Progressing   Problem: Nutrition: Goal: Risk of aspiration will decrease Outcome: Progressing   Problem: Intracerebral Hemorrhage Tissue Perfusion: Goal: Complications of Intracerebral Hemorrhage will be minimized Outcome: Progressing   Problem: Ischemic Stroke/TIA Tissue Perfusion: Goal: Complications of ischemic stroke/TIA will be minimized Outcome: Progressing   Problem: Spontaneous Subarachnoid Hemorrhage Tissue Perfusion: Goal: Complications of Spontaneous Subarachnoid Hemorrhage will be minimized Outcome: Progressing

## 2022-08-22 NOTE — Progress Notes (Signed)
BP 204/78; Dr. Tressa Busman notified ; patient per report is permissive hypertension. No BP med prn med until 220/110. CN notified re: triggering MEWS. Incoming RN notified.

## 2022-08-22 NOTE — Progress Notes (Signed)
   08/22/22 1922  Assess: MEWS Score  Temp 98.2 F (36.8 C)  BP (!) 204/78  MAP (mmHg) 116  Pulse Rate 73  Resp 16  SpO2 97 %  O2 Device Room Air  Assess: MEWS Score  MEWS Temp 0  MEWS Systolic 2  MEWS Pulse 0  MEWS RR 0  MEWS LOC 0  MEWS Score 2  MEWS Score Color Yellow  Assess: if the MEWS score is Yellow or Red  Were vital signs taken at a resting state? Yes  Focused Assessment Change from prior assessment (see assessment flowsheet)  Does the patient meet 2 or more of the SIRS criteria? No  MEWS guidelines implemented *See Row Information* Yes  Treat  MEWS Interventions Other (Comment) (call MD)  Pain Scale 0-10  Pain Score 0  Take Vital Signs  Increase Vital Sign Frequency  Yellow: Q 2hr X 2 then Q 4hr X 2, if remains yellow, continue Q 4hrs  Escalate  MEWS: Escalate Yellow: discuss with charge nurse/RN and consider discussing with provider and RRT  Notify: Charge Nurse/RN  Name of Charge Nurse/RN Notified jamie  Date Charge Nurse/RN Notified 08/22/22  Time Charge Nurse/RN Notified 1930  Notify: Provider  Provider Name/Title Dr.Engler  Date Provider Notified 08/22/22  Time Provider Notified 1930  Method of Notification Call  Notification Reason Critical result  Provider response See new orders  Date of Provider Response 08/22/22  Time of Provider Response 1930  Document  Patient Outcome Other (Comment) (stable)  Progress note created (see row info) Yes  Assess: SIRS CRITERIA  SIRS Temperature  0  SIRS Pulse 0  SIRS Respirations  0  SIRS WBC 1  SIRS Score Sum  1

## 2022-08-22 NOTE — Plan of Care (Signed)

## 2022-08-22 NOTE — TOC Transition Note (Signed)
Transition of Care [facility]) - CM/SW Discharge Note   Patient Details  Name: [name] MRN: [id number] Date of Birth: 1940/12/30  [facility] of Care [facility]) CM/SW Contact:  Pollie Friar, RN Phone Number: 08/22/2022, 1:01 PM   Clinical Narrative:    Pt is discharging to CIR today. CM signing off.    Final next level of care: IP Rehab Facility Barriers to Discharge: No Barriers Identified   Patient Goals and CMS Choice     Choice offered to / list presented to : Patient  Discharge Placement                       Discharge Plan and Services                                     Social Determinants of Health (SDOH) Interventions     Readmission Risk Interventions     No data to display

## 2022-08-22 NOTE — Progress Notes (Signed)
Physical Therapy Treatment Patient Details Name: [name] MRN: [id number] DOB: Jan 14, 1941 Today's Date: 08/22/2022   History of Present Illness Pt is a 81 y/o female admitted 9/12 with L sided weakness and aphasia.  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    PT Comments    PAtient progressing today to inpatient rehab.  Able to transition to [facility] then to wheelchair for transport.  Eager and somewhat impulsive getting up to EOB today.  She is high fall risk, but will benefit from acute inpatient rehab prior to d/c home.    Recommendations for follow up therapy are one component of a multi-disciplinary discharge planning process, led by the attending physician.  Recommendations may be updated based on patient status, additional functional criteria and insurance authorization.  Follow Up Recommendations  Acute inpatient rehab (3hours/day)     Assistance Recommended at Discharge Frequent or constant Supervision/Assistance  Patient can return home with the following A little help with walking and/or transfers;A little help with bathing/dressing/bathroom;Assistance with cooking/housework;Assist for transportation;Help with stairs or ramp for entrance   Equipment Recommendations  Rolling walker (2 wheels)    Recommendations for Other Services       Precautions / Restrictions Precautions Precautions: Fall Restrictions Weight Bearing Restrictions: No     Mobility  Bed Mobility Overal bed mobility: Needs Assistance Bed Mobility: Supine to Sit     Supine to sit: HOB elevated, Min assist     General bed mobility comments: pt initiating impulsively to come up to EOB assist for balance and L UE    Transfers Overall transfer level: Needs assistance Equipment used: 1 person hand held assist Transfers: Sit to/from Stand, Bed to chair/wheelchair/BSC Sit to Stand: Mod assist,  Min assist, +2 physical assistance, +2 safety/equipment   Step pivot transfers: Mod assist       General transfer comment: pivot to [facility] for toileting, then pt for transfer to acute inpatient rehab so pivot to wheelchair for nursing to transport to rehab.  assist for weight shift and to ensure safe L LE movement with pivotal steps to chair    Ambulation/Gait                   Stairs             Wheelchair Mobility    Modified Rankin (Stroke Patients Only) Modified Rankin (Stroke Patients Only) Pre-Morbid Rankin Score: No symptoms Modified Rankin: Severe disability     Balance Overall balance assessment: Needs assistance Sitting-balance support: Feet supported Sitting balance-Leahy Scale: Poor Sitting balance - Comments: 1 UE support on bed rail while sitting EOB also unaware of L lateral lean at times     Standing balance-Leahy Scale: Poor Standing balance comment: relies on UE and external support                            Cognition Arousal/Alertness: Awake/alert Behavior During Therapy: WFL for tasks assessed/performed Overall Cognitive Status: Impaired/Different from baseline Area of Impairment: Attention, Problem solving, Awareness, Safety/judgement                   Current Attention Level: Sustained   Following Commands: Follows one step commands consistently, Follows one step commands with increased time Safety/Judgement: Decreased awareness of deficits, Decreased awareness of safety   Problem Solving: Slow processing, Decreased initiation, Difficulty sequencing, Requires  verbal cues          Exercises      General Comments General comments (skin integrity, edema, etc.): Donned resting hand splint on L hadn. To be worn for 4 hours and taken off for the following 4 hours to coincide with medication schedule      Pertinent Vitals/Pain Pain Assessment Pain Assessment: No/denies pain    Home Living                           Prior Function            PT Goals (current goals can now be found in the care plan section) Progress towards PT goals: Progressing toward goals    Frequency    Min 4X/week      PT Plan Current plan remains appropriate    Co-evaluation              AM-PAC PT "6 Clicks" Mobility   Outcome Measure  Help needed turning from your back to your side while in a flat bed without using bedrails?: A Lot Help needed moving from lying on your back to sitting on the side of a flat bed without using bedrails?: A Lot Help needed moving to and from a bed to a chair (including a wheelchair)?: Total Help needed standing up from a chair using your arms (e.g., wheelchair or bedside chair)?: Total Help needed to walk in hospital room?: Total Help needed climbing 3-5 steps with a railing? : Total 6 Click Score: 8    End of Session Equipment Utilized During Treatment: Gait belt Activity Tolerance: Patient tolerated treatment well Patient left: in chair   PT Visit Diagnosis: Other abnormalities of gait and mobility (R26.89);Other symptoms and signs involving the nervous system (R29.898);Hemiplegia and hemiparesis Hemiplegia - Right/Left: Left Hemiplegia - dominant/non-dominant: Non-dominant Hemiplegia - caused by: Cerebral infarction     Time: 1515-1530 PT Time Calculation (min) (ACUTE ONLY): 15 min  Charges:  $Therapeutic Activity: 8-22 mins                     [name], PT Acute Rehabilitation Services Office:[phone] 08/22/2022    [name] 08/22/2022, 7:19 PM

## 2022-08-22 NOTE — Progress Notes (Addendum)
Report given to Levada Dy, pt to transfer to 4M09.  Husband called and notified of transfer.

## 2022-08-22 NOTE — Care Management Important Message (Signed)
Important Message  Patient Details  Name: [name] MRN: [id number] Date of Birth: Jul 07, 1941   Medicare Important Message Given:  Yes     Hannah Beat 08/22/2022, 2:23 PM

## 2022-08-22 NOTE — Progress Notes (Signed)
Inpatient Rehab Admissions Coordinator:  ° °I have a CIR bed for this Pt. Today. RN may call report to [phone]. ° °Fumi Guadron, MS, CCC-SLP °Rehab Admissions Coordinator  °[phone] (celll) °[phone] (office) ° °

## 2022-08-23 LAB — CBC WITH DIFFERENTIAL/PLATELET
Abs Immature Granulocytes: 0.09 10*3/uL — ABNORMAL HIGH (ref 0.00–0.07)
Basophils Absolute: 0.1 10*3/uL (ref 0.0–0.1)
Basophils Relative: 1 %
Eosinophils Absolute: 0.2 10*3/uL (ref 0.0–0.5)
Eosinophils Relative: 2 %
HCT: 39.7 % (ref 36.0–46.0)
Hemoglobin: 13 g/dL (ref 12.0–15.0)
Immature Granulocytes: 1 %
Lymphocytes Relative: 31 %
Lymphs Abs: 3.2 10*3/uL (ref 0.7–4.0)
MCH: 29 pg (ref 26.0–34.0)
MCHC: 32.7 g/dL (ref 30.0–36.0)
MCV: 88.6 fL (ref 80.0–100.0)
Monocytes Absolute: 1.6 10*3/uL — ABNORMAL HIGH (ref 0.1–1.0)
Monocytes Relative: 15 %
Neutro Abs: 5.3 10*3/uL (ref 1.7–7.7)
Neutrophils Relative %: 50 %
Platelets: 317 10*3/uL (ref 150–400)
RBC: 4.48 MIL/uL (ref 3.87–5.11)
RDW: 15.7 % — ABNORMAL HIGH (ref 11.5–15.5)
WBC: 10.4 10*3/uL (ref 4.0–10.5)
nRBC: 0 % (ref 0.0–0.2)

## 2022-08-23 LAB — COMPREHENSIVE METABOLIC PANEL
ALT: 26 U/L (ref 0–44)
AST: 26 U/L (ref 15–41)
Albumin: 3 g/dL — ABNORMAL LOW (ref 3.5–5.0)
Alkaline Phosphatase: 39 U/L (ref 38–126)
Anion gap: 11 (ref 5–15)
BUN: 17 mg/dL (ref 8–23)
CO2: 24 mmol/L (ref 22–32)
Calcium: 9.2 mg/dL (ref 8.9–10.3)
Chloride: 102 mmol/L (ref 98–111)
Creatinine, Ser: 1.01 mg/dL — ABNORMAL HIGH (ref 0.44–1.00)
GFR, Estimated: 57 mL/min — ABNORMAL LOW (ref >60)
Glucose, Bld: 79 mg/dL (ref 70–99)
Potassium: 3.8 mmol/L (ref 3.5–5.1)
Sodium: 137 mmol/L (ref 135–145)
Total Bilirubin: 0.5 mg/dL (ref 0.3–1.2)
Total Protein: 5.6 g/dL — ABNORMAL LOW (ref 6.5–8.1)

## 2022-08-23 MED ORDER — SENNOSIDES-DOCUSATE SODIUM 8.6-50 MG PO TABS
1.0000 | ORAL_TABLET | Freq: Every day | ORAL | Status: DC
  Administered 2022-08-23 – 2022-08-27 (×5): 1 via ORAL
  Filled 2022-08-23 (×5): qty 1

## 2022-08-23 MED ORDER — PNEUMOCOCCAL 20-VAL CONJ VACC 0.5 ML IM SUSY
0.5000 mL | PREFILLED_SYRINGE | INTRAMUSCULAR | Status: DC
  Filled 2022-08-23: qty 0.5

## 2022-08-23 MED ORDER — POLYETHYLENE GLYCOL 3350 17 G PO PACK
17.0000 g | PACK | Freq: Two times a day (BID) | ORAL | Status: DC
  Administered 2022-08-23 – 2022-08-27 (×8): 17 g via ORAL
  Filled 2022-08-23 (×10): qty 1

## 2022-08-23 NOTE — Progress Notes (Signed)
PROGRESS NOTE   Subjective/Complaints:  Patient seen at bedside. No acute complaints. No events overnight.   LBM 8 days ago per patient. Small on 9/15 per documentation.      08/23/2022    5:02 PM 08/23/2022    2:41 PM 08/23/2022   11:33 AM  Vitals with BMI  Systolic 025 427 062  Diastolic 65 72 66  Pulse 65 82 74     ROS: Denies fevers, chills, N/V, abdominal pain, constipation, diarrhea, SOB, cough, chest pain, new weakness or paraesthesias.    Objective:   No results found. Recent Labs    08/21/22 0709 08/23/22 0612  WBC 11.6* 10.4  HGB 12.6 13.0  HCT 38.8 39.7  PLT 313 317   Recent Labs    08/21/22 0709 08/23/22 0612  NA 139 137  K 3.3* 3.8  CL 108 102  CO2 21* 24  GLUCOSE 94 79  BUN 7* 17  CREATININE 0.73 1.01*  CALCIUM 8.8* 9.2    Intake/Output Summary (Last 24 hours) at 08/23/2022 2056 Last data filed at 08/23/2022 1841 Gross per 24 hour  Intake 360 ml  Output --  Net 360 ml        Physical Exam: Vital Signs reviewed as above.  Constitutional: No apparent distress. Appropriate appearance for age.  HENT: Atraumatic, normocephalic. + l facial droop Eyes: PERRL. No apparent EOM deficits.  Cardiovascular: RRR, no murmurs/rub/gallops. No Edema. Peripheral pulses 2+  Respiratory: CTAB. No rales, rhonchi, or wheezing. On RA.  Abdomen: + bowel sounds, normoactive, nondistended  Skin: C/D/I. No apparent lesions.  MSK: Moving all 4 limbs in bed  Neurologic exam:  Cognition: AAO to person, place, time and event. Poor recall of therapies.  PE from prior encounter: Vitals and nursing note reviewed.    Constitutional: No apparent distress. Appropriate appearance for age.  HENT: No JVD. Neck Supple. Trachea midline. Atraumatic, normocephalic. + scleral icterus Eyes: PERRLA. EOMI. Visual fields grossly intact.  Cardiovascular: RRR, no murmurs/rub/gallops. No Edema. Peripheral pulses 2+   Respiratory: CTAB. No rales, rhonchi, or wheezing. On RA.  Abdomen: + bowel sounds, normoactive. No distention or tenderness.  GU: Not examined.  Skin: Skin is warm and dry.     Comments: Paper thin with multiple ecchymotic areas LUE>RUE.   MSK:      No apparent deformity.      Strength:                LUE: 3/5 SA, 2/5 EF, 2/5 EE, 0/5 WE, 0/5 FF, 0/5 FA                 RUE: 5/5 SA, 5/5 EF, 5/5 EE, 5/5 WE, 5/5 FF, 5/5 FA                 RLE: 5/5 HF, 5/5 KE, 5/5 DF, 5/5 EHL, 5/5 PF                 LLE:  5/5 HF, 5/5 KE, 5/5 DF, 5/5 EHL, 5/5 PF    Neurologic exam:  Cognition: AAO to person, place, time and event. Cannot perform addition or spell WORLD backwards.  Language: Fluent, No substitutions or  neoglisms. Mild dysarthria. Names 3/3 objects correctly.  Memory: Recalls 3/3 objects at 5 minutes.  Insight: Good insight into current condition.  Mood: Anxious mood.  Sensation: To light touch intact in BL UEs and LEs  Reflexes: 2+ in BL UE and LEs. Negative Hoffman's and babinski signs bilaterally.  CN: + L facial droop Coordination: No apparent tremors. Spasticity: MAS 0 in all extremities.        Assessment/Plan: 1. Functional deficits which require 3+ hours per day of interdisciplinary therapy in a comprehensive inpatient rehab setting. Physiatrist is providing close team supervision and 24 hour management of active medical problems listed below. Physiatrist and rehab team continue to assess barriers to discharge/monitor patient progress toward functional and medical goals  Care Tool:  Bathing    Body parts bathed by patient: Right arm, Chest, Abdomen, Front perineal area, Right upper leg, Left upper leg, Face   Body parts bathed by helper: Left arm, Buttocks, Right lower leg, Left lower leg     Bathing assist Assist Level: Moderate Assistance - Patient 50 - 74%     Upper Body Dressing/Undressing Upper body dressing   What is the patient wearing?: Pull over shirt     Upper body assist Assist Level: Maximal Assistance - Patient 25 - 49%    Lower Body Dressing/Undressing Lower body dressing      What is the patient wearing?: Pants, Underwear/pull up     Lower body assist Assist for lower body dressing: Maximal Assistance - Patient 25 - 49%     Toileting Toileting    Toileting assist Assist for toileting: Maximal Assistance - Patient 25 - 49%     Transfers Chair/bed transfer  Transfers assist     Chair/bed transfer assist level: Moderate Assistance - Patient 50 - 74%     Locomotion Ambulation   Ambulation assist      Assist level: 2 helpers (mod assist of 1 and +2 w/c follow) Assistive device: Other (comment) (wall hand rail on right side progressing to bilateral HHA) Max distance: 57f no turning   Walk 10 feet activity   Assist     Assist level: 2 helpers (mod assist of 1 and +2 w/c follow) Assistive device: Other (comment) (wall hand rail on right side progressing to bilateral HHA)   Walk 50 feet activity   Assist Walk 50 feet with 2 turns activity did not occur: Safety/medical concerns         Walk 150 feet activity   Assist Walk 150 feet activity did not occur: Safety/medical concerns         Walk 10 feet on uneven surface  activity   Assist Walk 10 feet on uneven surfaces activity did not occur: Safety/medical concerns         Wheelchair     Assist Is the patient using a wheelchair?: Yes (Transport to and from therapy gym for energy conservation and time management.) Type of Wheelchair: Manual    Wheelchair assist level: Dependent - Patient 0% Max wheelchair distance: 150    Wheelchair 50 feet with 2 turns activity    Assist        Assist Level: Dependent - Patient 0%   Wheelchair 150 feet activity     Assist      Assist Level: Dependent - Patient 0%   Blood pressure (!) 143/65, pulse 65, temperature 97.7 F (36.5 C), resp. rate 19, height 4' 11.5" (1.511 m),  weight 51.4 kg, SpO2 94 %.  Medical Problem List  and Plan: 1. 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  Antithrombotics: -DVT/anticoagulation:  Pharmaceutical: Lovenox             -antiplatelet therapy: DAPT X 3 weeks followed by ASA alone.  3. Post herpetic neuralgia/Pain Management: Was on gabapentin for left facial and scalp pain.  --will resume gabapentin. .  4. Mood/Behavior/Sleep: LCSW to follow for evaluation and support.              -antipsychotic agents: N/A 5. Neuropsych/cognition: This patient is capable of making decisions on her own behalf. 6. Skin/Wound Care: Routine pressure relief measures.  7. Fluids/Electrolytes/Nutrition: Monitor I/O. Check CMET in am.              - 9/16 - Mild AKI. Encourage PO fluids, recheck Monday.     Latest Ref Rng & Units 08/23/2022    6:12 AM 08/21/2022    7:09 AM 08/20/2022    3:38 AM  BMP  Glucose 70 - 99 mg/dL 79  94  122   BUN 8 - 23 mg/dL '17  7  7   '$ Creatinine 0.44 - 1.00 mg/dL 1.01  0.73  0.83   Sodium 135 - 145 mmol/L 137  139  135   Potassium 3.5 - 5.1 mmol/L 3.8  3.3  3.4   Chloride 98 - 111 mmol/L 102  108  97   CO2 22 - 32 mmol/L '24  21  25   '$ Calcium 8.9 - 10.3 mg/dL 9.2  8.8  8.8     8. HTN: Monitor BP TID. Continue inderal and Cozaar.             - Per Neurology notes, permissive up to 220/110, target <200 and gradually decrease over 5-7 days (target date for normal 9/19)            - Hydralazine PRN 10 mg for SBP >220           - 9/16 - significant BP downtrend in last 24 hours; monitor    08/23/2022    5:02 PM 08/23/2022    2:41 PM 08/23/2022   11:33 AM  Vitals with BMI  Systolic 354 562 563  Diastolic 65 72 66  Pulse 65 82 74      9. H/o occasional Headaches: Used ASA at home prn. . --continue Inderal. Resume tramadol prn.  10. H/o anxiety d/o: Continue  Cymbalta.  11. Hypokalemia: Likely dilutional from IVF--will supplement today X 1  --recheck BMET in am.  12. Leucocytosis: Resolving 16.8-->11.6 and question reactive --Has had issues with elevation in the past-->chronic?              - admission labs stable   13. 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 H/o Asthma: Resume Singulair. Albuterol MDI prn as rescue.    15. Dysphagia: Multifactorial due to facial weakness, numbness? difficulty breathing due to sinus surgeries etc             --pt and husband would like to try D2 diet.              -  SLP consult  16. Constipation            - Small BM 9/15           - 9/16 - add Senna S 2 tabs QHS + Miralax BID    LOS: 1 days A FACE TO FACE EVALUATION WAS PERFORMED  Gertie Gowda 08/23/2022, 8:56 PM

## 2022-08-23 NOTE — Plan of Care (Cosign Needed)
  Problem: RH Balance Goal: LTG Patient will maintain dynamic sitting balance (PT) Description: LTG:  Patient will maintain dynamic sitting balance with assistance during mobility activities (PT) Flowsheets (Taken 08/23/2022 1813) LTG: Pt will maintain dynamic sitting balance during mobility activities with:: Supervision/Verbal cueing Goal: LTG Patient will maintain dynamic standing balance (PT) Description: LTG:  Patient will maintain dynamic standing balance with assistance during mobility activities (PT) Flowsheets (Taken 08/23/2022 1813) LTG: Pt will maintain dynamic standing balance during mobility activities with:: Contact Guard/Touching assist   Problem: RH Balance Goal: LTG Patient will maintain dynamic sitting balance (PT) Description: LTG:  Patient will maintain dynamic sitting balance with assistance during mobility activities (PT) Flowsheets (Taken 08/23/2022 1813) LTG: Pt will maintain dynamic sitting balance during mobility activities with:: Supervision/Verbal cueing   Problem: Sit to Stand Goal: LTG:  Patient will perform sit to stand with assistance level (PT) Description: LTG:  Patient will perform sit to stand with assistance level (PT) Flowsheets (Taken 08/23/2022 1813) LTG: PT will perform sit to stand in preparation for functional mobility with assistance level: Supervision/Verbal cueing   Problem: RH Bed Mobility Goal: LTG Patient will perform bed mobility with assist (PT) Description: LTG: Patient will perform bed mobility with assistance, with/without cues (PT). Flowsheets (Taken 08/23/2022 1813) LTG: Pt will perform bed mobility with assistance level of: Supervision/Verbal cueing   Problem: RH Bed to Chair Transfers Goal: LTG Patient will perform bed/chair transfers w/assist (PT) Description: LTG: Patient will perform bed to chair transfers with assistance (PT). Flowsheets (Taken 08/23/2022 1813) LTG: Pt will perform Bed to Chair Transfers with assistance level:  Supervision/Verbal cueing   Problem: RH Car Transfers Goal: LTG Patient will perform car transfers with assist (PT) Description: LTG: Patient will perform car transfers with assistance (PT). Flowsheets (Taken 08/23/2022 1813) LTG: Pt will perform car transfers with assist:: Contact Guard/Touching assist   Problem: RH Ambulation Goal: LTG Patient will ambulate in controlled environment (PT) Description: LTG: Patient will ambulate in a controlled environment, # of feet with assistance (PT). Flowsheets (Taken 08/23/2022 1813) LTG: Pt will ambulate in controlled environ  assist needed:: Supervision/Verbal cueing LTG: Ambulation distance in controlled environment: 136f using LRAD Goal: LTG Patient will ambulate in home environment (PT) Description: LTG: Patient will ambulate in home environment, # of feet with assistance (PT). Flowsheets (Taken 08/23/2022 1813) LTG: Pt will ambulate in home environ  assist needed:: Supervision/Verbal cueing LTG: Ambulation distance in home environment: 539fusing LRAD   Problem: RH Stairs Goal: LTG Patient will ambulate up and down stairs w/assist (PT) Description: LTG: Patient will ambulate up and down # of stairs with assistance (PT) Flowsheets (Taken 08/23/2022 1813) LTG: Pt will ambulate up/down stairs assist needed:: Contact Guard/Touching assist LTG: Pt will  ambulate up and down number of stairs: 2 using LRAD per home set up

## 2022-08-23 NOTE — Plan of Care (Signed)
  Problem: RH Swallowing Goal: LTG Patient will consume least restrictive diet using compensatory strategies with assistance (SLP) Description: LTG:  Patient will consume least restrictive diet using compensatory strategies with assistance (SLP) Flowsheets (Taken 08/23/2022 1506) LTG: Pt Patient will consume least restrictive diet using compensatory strategies with assistance of (SLP): Modified Independent Goal: LTG Patient will participate in dysphagia therapy to increase swallow function with assistance (SLP) Description: LTG:  Patient will participate in dysphagia therapy to increase swallow function with assistance (SLP) Flowsheets (Taken 08/23/2022 1506) LTG: Pt will participate in dysphagia therapy to increase swallow function with assistance of (SLP): Modified Independent   Problem: RH Problem Solving Goal: LTG Patient will demonstrate problem solving for (SLP) Description: LTG:  Patient will demonstrate problem solving for basic/complex daily situations with cues  (SLP) Flowsheets (Taken 08/23/2022 1506) LTG: Patient will demonstrate problem solving for (SLP): Complex daily situations LTG Patient will demonstrate problem solving for: Modified Independent   Problem: RH Memory Goal: LTG Patient will use memory compensatory aids to (SLP) Description: LTG:  Patient will use memory compensatory aids to recall biographical/new, daily complex information with cues (SLP) Flowsheets (Taken 08/23/2022 1506) LTG: Patient will use memory compensatory aids to (SLP): Modified Independent

## 2022-08-23 NOTE — Evaluation (Signed)
Physical Therapy Assessment and Plan  Patient Details  Name: [name] MRN: [id number] Date of Birth: 07-16-1941  PT Diagnosis: Abnormal posture, Abnormality of gait, Difficulty walking, and Hemiparesis non-dominant Rehab Potential: Good ELOS: 14-18 days   Today's Date: 08/23/2022 PT Individual Time: 1021-1132 PT Individual Time Calculation (min): 52 min    Hospital Problem: Principal Problem:   Right thalamic stroke [facility])   Past Medical History:  Past Medical History:  Diagnosis Date   Anemia    Arthritis    Asthma    uses inhaler just prior to surgery to avoid attack   Back pain    from previous injury   Complication of anesthesia    has woken  up during 2 different surgery   Depression    no current issue/treatment; situation   Gallstones    GERD (gastroesophageal reflux disease)    Hiatal hernia    patient does NOT have nerve/muscle disease   History of kidney stones    HLD (hyperlipidemia)    HTN (hypertension)    Hypothyroidism    Kidney stones    Knee pain    Non-diabetic pancreatic hormone dysfunction years   pt. 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   Pancreatitis    Pneumonia    Seizures (HCC)    caused by dye injected during a procedure   Shortness of breath    with exertion   Sinus problem    frequent infections/congestion   Stroke ([place]) 2021   reports having CVA in 2021 and having mini strokes before that   Thyroid disease    Past Surgical History:  Past Surgical History:  Procedure Laterality Date   ABDOMINAL HYSTERECTOMY     APPENDECTOMY     CARPAL TUNNEL RELEASE  10+ years ago   bilateral   EYE SURGERY  3 yrs ago   bilateral cataracts   FOOT OSTEOTOMY  6 weeks ago   Left foot: great, 2nd & 3rd   FOOT OSTEOTOMY  5 years ago   Right great toe   HAND SURGERY Bilateral 2011-most recent   multiple hand surgeries, 2 on left, 3 on right   KNEE ARTHROPLASTY Right 04/28/2022   Procedure: COMPUTER ASSISTED TOTAL KNEE ARTHROPLASTY;   Surgeon: Dereck Leep, MD;  Location: [facility];  Service: Orthopedics;  Laterality: Right;   LOOP RECORDER INSERTION N/A 05/16/2020   Procedure: LOOP RECORDER INSERTION;  Surgeon: Isaias Cowman, MD;  Location: [facility];  Service: Cardiovascular;  Laterality: N/A;   NASAL SINUS SURGERY  most recent 7-8 yrs ago   7 sinus surgeries    TRIGGER FINGER RELEASE  11/19/2011   Procedure: RELEASE TRIGGER FINGER/A-1 PULLEY;  Surgeon: Wynonia Sours, MD;  Location: [place];  Service: Orthopedics;  Laterality: Right;  release a-1 pulley right index finger and cyst removal   WRIST GANGLION EXCISION  1980's   right    Assessment & Plan Clinical Impression: [name] is a 81 year old female with history of HTN, seizures, positive anti-CCP test, anxiety d/o, ,mini strokes; who was admitted on 08/19/22 with acute onset of LUE weakness and expressive deficits. 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 EEG negative for seizures. 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  2D echo showed EF 55-60% with mild  LVH and no wall abnormality. 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 30 day event monitor recommended at discharge. Acute on chronic HA managed with use of toradol and bouts of agitation due to claustrophobia noted.  PT/OT/ST have been working with patient who continues to be limited by left sided weakness with left lean, headaches and ambulating short distances with mod assist. CIR recommended due to functional decline.   Patient transferred to CIR on 08/22/2022 .   Patient currently requires mod with mobility secondary to muscle weakness, decreased cardiorespiratoy endurance, impaired timing and  sequencing, abnormal tone, unbalanced muscle activation, and decreased coordination, decreased midline orientation and decreased attention to left, decreased awareness and decreased safety awareness, and decreased sitting balance, decreased standing balance, decreased postural control, and decreased balance strategies.  Prior to hospitalization, patient was modified independent  with mobility and lived with Spouse in a House home.  Home access is 2Stairs to enter.  Patient will benefit from skilled PT intervention to maximize safe functional mobility, minimize fall risk, and decrease caregiver burden for planned discharge home with 24 hour supervision.  Anticipate patient will benefit from follow up OP at discharge.  PT - End of Session Activity Tolerance: Tolerates 30+ min activity with multiple rests Endurance Deficit: Yes PT Assessment Rehab Potential (ACUTE/IP ONLY): Good PT Barriers to Discharge: Home environment access/layout PT Patient demonstrates impairments in the following area(s): Balance;Endurance;Motor;Pain;Safety;Skin Integrity;Perception PT Transfers Functional Problem(s): Bed Mobility;Bed to Chair;Car PT Locomotion Functional Problem(s): Ambulation;Stairs PT Plan PT Intensity: Minimum of 1-2 x/day ,45 to 90 minutes PT Frequency: 5 out of 7 days PT Duration Estimated Length of Stay: 14-18 days PT Treatment/Interventions: Ambulation/gait training;Balance/vestibular training;Community reintegration;Disease management/prevention;Discharge planning;DME/adaptive equipment instruction;Functional mobility training;Pain management;Psychosocial support;Splinting/orthotics;Therapeutic Activities;UE/LE Strength taining/ROM;UE/LE Coordination activities;Therapeutic Exercise;Stair training;Skin care/wound management;Patient/family education;Neuromuscular re-education;Functional electrical stimulation;Cognitive remediation/compensation;Visual/perceptual remediation/compensation PT Transfers  Anticipated Outcome(s): supervision using LRAD PT Locomotion Anticipated Outcome(s): supervision using LRAD PT Recommendation Recommendations for Other Services: Therapeutic Recreation consult Therapeutic Recreation Interventions: Outing/community reintergration;Stress management Follow Up Recommendations: Outpatient PT;24 hour supervision/assistance Patient destination: Home Equipment Recommended: To be determined   PT Evaluation Precautions/Restrictions Precautions Precautions: Fall Precaution Comments: L hemi (UE>LE), L lean, impulsive. Restrictions Weight Bearing Restrictions: No Pain Pain Assessment Pain Scale: 0-10 Pain Score: 8  Pain Location: Head (Pt reports pain in head is from having shinges recently) Pain Descriptors / Indicators: Aching Pain Onset: On-going Pain Intervention(s): Medication (See eMAR) Pain Interference Pain Interference Pain Effect on Sleep: 3. Frequently Pain Interference with Therapy Activities: 1. Rarely or not at all Pain Interference with Day-to-Day Activities: 1. Rarely or not at all Home Living/Prior [place] Available Help at Discharge: Family;Available 24 hours/day Type of Home: House Home Access: Stairs to enter (front entrance) Entrance Stairs-Number of Steps: 2steps then small threshold into house Entrance Stairs-Rails: None Home Layout: Two level;Able to live on main level with bedroom/bathroom Additional Comments: Owns 3WW  Lives With: Spouse (husband, [name]) Prior Function Level of Independence: Independent with basic ADLs;Independent with homemaking with ambulation;Independent with transfers;Independent with gait (uisng 3WW for gait)  Able to Take Stairs?: Yes Driving: Yes Vocation: Retired Vision/Perception  Vision - History Ability to See in Adequate Light: 0 Adequate Perception Perception: Impaired Inattention/Neglect: Does not attend to left visual field Praxis Praxis: Intact  Cognition Overall  Cognitive Status: Within Functional Limits for tasks assessed Orientation Level: Oriented X4 Attention: Focused;Sustained Focused Attention: Appears intact Sustained Attention: Appears intact Memory: Appears  intact Awareness: Impaired Problem Solving: Appears intact Behaviors: Impulsive Safety/Judgment: Impaired Comments: Not taking adequate steps with L LE causing occasional leaning and falling to the left with functional tasks. Sensation Sensation Light Touch: Appears Intact Hot/Cold: Not tested Proprioception: Impaired by gross assessment Stereognosis: Not tested Coordination Gross Motor Movements are Fluid and Coordinated: No (min-mod difficulty with L LE placement during gait) Coordination and Movement Description: impaired midline orientation. Motor  Motor Motor: Hemiplegia;Abnormal tone Motor - Skilled Clinical Observations: L hemiparesis UE>LE   Trunk/Postural Assessment  Cervical Assessment Cervical Assessment: Within Functional Limits Thoracic Assessment Thoracic Assessment:  (fwd head and rounded shoulders) Postural Control Postural Control: Deficits on evaluation (left lateral trunk lean)  Balance Balance Balance Assessed: Yes Static Sitting Balance Static Sitting - Balance Support: Feet supported Static Sitting - Level of Assistance: 5: Stand by assistance;Other (comment) (CGA) Dynamic Sitting Balance Dynamic Sitting - Balance Support: Feet supported Dynamic Sitting - Level of Assistance: 4: Min assist Sitting balance - Comments: L lateral lean at times Static Standing Balance Static Standing - Balance Support: During functional activity Static Standing - Level of Assistance: 4: Min assist Dynamic Standing Balance Dynamic Standing - Balance Support: Right upper extremity supported;During functional activity Dynamic Standing - Level of Assistance: 3: Mod assist;2: Max assist Dynamic Standing - Comments: L lateral lean at times Extremity Assessment    RLE  Assessment RLE Assessment: Within Functional Limits LLE Assessment LLE Assessment: Exceptions to [facility] Active Range of Motion (AROM) Comments: WNL General Strength Comments: grossly tested in sitting LLE Strength Left Hip Flexion: 3+/5 Left Knee Flexion: 4-/5 Left Knee Extension: 4-/5 Left Ankle Dorsiflexion: 4-/5 Left Ankle Plantar Flexion: 4-/5  Care Tool Care Tool Bed Mobility Roll left and right activity   Roll left and right assist level: Minimal Assistance - Patient > 75%    Sit to lying activity   Sit to lying assist level: Minimal Assistance - Patient > 75%    Lying to sitting on side of bed activity   Lying to sitting on side of bed assist level: the ability to move from lying on the back to sitting on the side of the bed with no back support.: Minimal Assistance - Patient > 75%     Care Tool Transfers Sit to stand transfer   Sit to stand assist level: Moderate Assistance - Patient 50 - 74%    Chair/bed transfer   Chair/bed transfer assist level: Moderate Assistance - Patient 50 - 74%     Physiological scientist transfer assist level: Moderate Assistance - Patient 50 - 74%      Care Tool Locomotion Ambulation   Assist level: 2 helpers (mod assist of 1 and +2 w/c follow) Assistive device: Other (comment) (wall hand rail on right side progressing to bilateral HHA) Max distance: 69f no turning  Walk 10 feet activity   Assist level: 2 helpers (mod assist of 1 and +2 w/c follow) Assistive device: Other (comment) (wall hand rail on right side progressing to bilateral HHA)   Walk 50 feet with 2 turns activity Walk 50 feet with 2 turns activity did not occur: Safety/medical concerns      Walk 150 feet activity Walk 150 feet activity did not occur: Safety/medical concerns      Walk 10 feet on uneven surfaces activity Walk 10 feet on uneven surfaces activity did not occur: Safety/medical concerns      Stairs Stair activity did not occur:  Safety/medical concerns        Walk up/down 1 step activity Walk up/down 1 step or curb (drop down) activity did not occur: Safety/medical concerns      Walk up/down 4 steps activity Walk up/down 4 steps activity did not occur: Safety/medical concerns      Walk up/down 12 steps activity Walk up/down 12 steps activity did not occur: Safety/medical concerns      Pick up small objects from floor   Pick up small object from the floor assist level: Maximal Assistance - Patient 25 - 49%    Wheelchair Is the patient using a wheelchair?: Yes (Transport to and from therapy gym for energy conservation and time management.) Type of Wheelchair: Manual   Wheelchair assist level: Dependent - Patient 0% Max wheelchair distance: 150  Wheel 50 feet with 2 turns activity   Assist Level: Dependent - Patient 0%  Wheel 150 feet activity   Assist Level: Dependent - Patient 0%    Refer to Care Plan for Long Term Goals  SHORT TERM GOAL WEEK 1 PT Short Term Goal 1 (Week 1): Pt will perform supine <>sit with CGA consistently. PT Short Term Goal 2 (Week 1): Pt will perform sit <> stand transfers with min assist and LRAD consistently. PT Short Term Goal 3 (Week 1): Pt will perform stand pivot transfers with min assist and LRAD consistently. PT Short Term Goal 4 (Week 1): Pt will amb at least 90 ft with min assist and LRAD.  Recommendations for other services: Therapeutic Recreation  Stress management and Outing/community reintegration  Skilled Therapeutic Intervention  Pt greeted seated in [employer] with husband present and agreeable to therapy. Evaluation completed (see details above) with patient education regarding purpose of PT evaluation, PT POC and goals, therapy schedule, weekly team meetings, and other CIR information including safety plan and fall risk safety. Pt performed the below functional mobility tasks with the specified levels of skilled cuing and assistance. Pt presents with decreased L sided  awareness that impacts pts seated and standing balance as well as ability to ambulate without assistance. She presents with a L lateral lean at times and has decreased proprioception of L foot evident when ambulating.  She also showed impulsive behavior. Pt was left supine in bed with bed alarm on, call bell in reach, all needs met.   Mobility Bed Mobility Bed Mobility: Sit to Supine Supine to Sit: Minimal Assistance - Patient > 75% Sit to Supine: Minimal Assistance - Patient > 75% Transfers Transfers: Sit to Stand;Stand to Sit;Stand Pivot Transfers Sit to Stand: Moderate Assistance - Patient 50-74% Stand to Sit: Moderate Assistance - Patient 50-74% Stand Pivot Transfers: Moderate Assistance - Patient 50 - 74% Stand Pivot Transfer Details: Verbal cues for sequencing;Verbal cues for technique;Verbal cues for precautions/safety;Visual cues/gestures for sequencing;Tactile cues for weight shifting Stand Pivot Transfer Details (indicate cue type and reason): cues for correct foot placement due to pt not taking a big enough step laterally to the left and causing imbalane to the left when turning to sit. Transfer (Assistive device): None Locomotion  Gait Ambulation: Yes Gait Assistance: 2 Helpers (one therapist with WC follow, one therapist guarding and providing L knee blocking in case of buckling or hyper extension) Gait Distance (Feet): 60 Feet Assistive device: Other (Comment) (wall hand rail on right progressing to bilateral HHA on SPT shoulders) Gait Assistance Details: Verbal cues for sequencing;Verbal cues for technique;Verbal cues for precautions/safety;Verbal cues for gait pattern;Manual facilitation for placement;Tactile cues for weight shifting;Tactile cues  for posture Gait Assistance Details: manual facilitation for placement of left foot due to pt adduction foot and demoing very narrow BOS Gait Gait: Yes Gait Pattern: Impaired Gait Pattern: Step-through pattern;Decreased stride  length;Left flexed knee in stance;Narrow base of support;Lateral trunk lean to left Gait velocity: Decreased Stairs / Additional Locomotion Stairs: No Wheelchair Mobility Wheelchair Mobility: No   Discharge Criteria: Patient will be discharged from PT if patient refuses treatment 3 consecutive times without medical reason, if treatment goals not met, if there is a change in medical status, if patient makes no progress towards goals or if patient is discharged from hospital.  The above assessment, treatment plan, treatment alternatives and goals were discussed and mutually agreed upon: by patient and by family  Kayleen Memos, SPT 08/23/2022, 6:00 PM

## 2022-08-23 NOTE — Evaluation (Signed)
Speech Language Pathology Assessment and Plan  Patient Details  Name: [name] MRN: [id number] Date of Birth: [birth year]  SLP Diagnosis: Dysphagia;Cognitive Impairments  Rehab Potential: Good ELOS: 2 weeks    Today's Date: 08/23/2022 SLP Individual Time: 1300-1400 SLP Individual Time Calculation (min): 60 min   Hospital Problem: Principal Problem:   Right thalamic stroke [facility])  Past Medical History:  Past Medical History:  Diagnosis Date   Anemia    Arthritis    Asthma    uses inhaler just prior to surgery to avoid attack   Back pain    from previous injury   Complication of anesthesia    has woken  up during 2 different surgery   Depression    no current issue/treatment; situation   Gallstones    GERD (gastroesophageal reflux disease)    Hiatal hernia    patient does NOT have nerve/muscle disease   History of kidney stones    HLD (hyperlipidemia)    HTN (hypertension)    Hypothyroidism    Kidney stones    Knee pain    Non-diabetic pancreatic hormone dysfunction years   pt. 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   Pancreatitis    Pneumonia    Seizures ([place])    caused by dye injected during a procedure   Shortness of breath    with exertion   Sinus problem    frequent infections/congestion   Stroke ([place]) 2021   reports having CVA in 2021 and having mini strokes before that   Thyroid disease    Past Surgical History:  Past Surgical History:  Procedure Laterality Date   ABDOMINAL HYSTERECTOMY     APPENDECTOMY     CARPAL TUNNEL RELEASE  10+ years ago   bilateral   EYE SURGERY  3 yrs ago   bilateral cataracts   FOOT OSTEOTOMY  6 weeks ago   Left foot: great, 2nd & 3rd   FOOT OSTEOTOMY  5 years ago   Right great toe   HAND SURGERY Bilateral 2011-most recent   multiple hand surgeries, 2 on left, 3 on right   KNEE ARTHROPLASTY Right 04/28/2022   Procedure: COMPUTER ASSISTED TOTAL KNEE ARTHROPLASTY;  Surgeon: Dereck Leep, MD;  Location: [facility] ORS;   Service: Orthopedics;  Laterality: Right;   LOOP RECORDER INSERTION N/A 05/16/2020   Procedure: LOOP RECORDER INSERTION;  Surgeon: Isaias Cowman, MD;  Location: [place] CV LAB;  Service: Cardiovascular;  Laterality: N/A;   NASAL SINUS SURGERY  most recent 7-8 yrs ago   7 sinus surgeries    TRIGGER FINGER RELEASE  11/19/2011   Procedure: RELEASE TRIGGER FINGER/A-1 PULLEY;  Surgeon: Wynonia Sours, MD;  Location: [place];  Service: Orthopedics;  Laterality: Right;  release a-1 pulley right index finger and cyst removal   WRIST GANGLION EXCISION  1980's   right    Assessment / Plan / Recommendation Clinical Impression  [name] is a 81 year old female with history of HTN, seizures, positive anti-CCP test, anxiety d/o, ,mini strokes; who was admitted on 08/19/22 with acute onset of LUE weakness and expressive deficits. 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 EEG negative for seizures. 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  2D echo showed EF 55-60% with mild LVH and no wall  abnormality. 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 30 day event monitor recommended at discharge. Acute on chronic HA managed with use of toradol and bouts of agitation due to claustrophobia noted.  PT/OT/ST have been working with patient who continues to be limited by left sided weakness with left lean, headaches and ambulating short distances with mod assist. CIR recommended due to functional decline.     Pt presents with mild cognitive linguistic deficits, characterized by delayed processing and short term/working memory. Pt indicated mild deficits in calculation and construction task.Pt scored WFL on attention  subsection and WFL on recall however only recalled 3/4 words. Pt supports acute changes in processing and minor changes in recall with baseline deficits attributed to aging. Pt presents with no deficits in expressive nor receptive language. Pt is 100% intelligible in conversation with a mild acute hoarse vocal quality. Oral motor exam indicated mild left side facial/lingual reduced ROM and weakness. Pt consumed thin liquids via cup/straw, dys 3 and regular textures with suspected mild swallow delay. Pt reporting biting inner buccal area during mastication of regular verse dys 3 textures. SLP recommends continuing dys 3 textures and thin liquid diet, medication whole in puree or small pills with thin liquids. Pt would benefit from ST services in order to maximize functional independence and reduce burden of care, requiring possible continued ST services if goals are not met by d/c.   Skilled Therapeutic Interventions          Skilled ST services focused on cognitive skills. SLP facilitated cognitive and BSE assessment. SLP and pt collaborated to set goals. All questions answered to satisfaction. Pt was left with husband in room and bed alarm set. Recommend ST services.    SLP Assessment  Patient will need skilled Speech Lanaguage Pathology Services during CIR admission    Recommendations  SLP Diet Recommendations: Dysphagia 3 (Mech soft);Thin Liquid Administration via: Cup;Straw Medication Administration: Whole meds with puree Supervision: Patient able to self feed Compensations: Slow rate;Small sips/bites Postural Changes and/or Swallow Maneuvers: Seated upright 90 degrees Oral Care Recommendations: Oral care BID Patient destination: Home Follow up Recommendations: Outpatient SLP Equipment Recommended: None recommended by SLP    SLP Frequency 3 to 5 out of 7 days   SLP Duration  SLP Intensity  SLP Treatment/Interventions 2 weeks  Minumum of 1-2 x/day, 30 to 90 minutes  Cognitive  remediation/compensation;Cueing hierarchy;Functional tasks;Patient/family education;Medication managment;Internal/external aids;Dysphagia/aspiration precaution training    Pain Pain Assessment Pain Scale: 0-10 Pain Score: 0-No pain Pain Type: Chronic pain Pain Location: Head (Pt reports pain in head is from having shinges recently) Pain Descriptors / Indicators: Aching Pain Onset: On-going Patients Stated Pain Goal: 0 Multiple Pain Sites: No  Prior Functioning Cognitive/Linguistic Baseline: Baseline deficits Baseline deficit details: mild memory Type of Home: House  Lives With: Spouse Available Help at Discharge: Family;Available 24 hours/day Vocation: Retired  Programmer, systems Overall Cognitive Status: Impaired/Different from baseline Orientation Level: Oriented X4 Memory: Impaired Memory Impairment: Retrieval deficit Awareness: Appears intact Problem Solving: Impaired (delayed processing) Problem Solving Impairment: Functional complex;Verbal complex Safety/Judgment: Impaired Comments: Not taking adequate steps with L LE causing occasional leaning and falling to the left with functional tasks.  Comprehension Auditory Comprehension Overall Auditory Comprehension: Appears within functional limits for tasks assessed Expression Expression Primary Mode of Expression: Verbal Verbal Expression Overall Verbal Expression: Appears within functional limits for tasks assessed Written Expression Dominant Hand: Right Written Expression: Not tested  Oral Motor Oral Motor/Sensory Function Overall Oral Motor/Sensory Function: Mild impairment Facial ROM: Reduced left Facial Symmetry: Abnormal symmetry left Facial Sensation: Within Functional Limits Lingual ROM: Within Functional Limits Lingual Symmetry: Within Functional Limits Lingual Strength: Reduced Motor Speech Overall Motor Speech: Impaired Respiration: Within functional limits Phonation: Hoarse Resonance: Within  functional limits Articulation: Within functional limitis Intelligibility: Intelligible Motor Planning: Witnin functional limits Motor Speech Errors: Not applicable Effective Techniques: Increased vocal intensity  Care Tool Care Tool Cognition Ability to hear (with hearing aid or hearing appliances if normally used Ability to hear (with hearing aid or hearing appliances if normally used): 0. Adequate - no difficulty in normal conservation, social interaction, listening to TV   Expression of Ideas and Wants Expression of Ideas and Wants: 4. Without difficulty (complex and basic) - expresses complex messages without difficulty and with speech that is clear and easy to understand   Understanding Verbal and Non-Verbal Content Understanding Verbal and Non-Verbal Content: 3. Usually understands - understands most conversations, but misses some part/intent of message. Requires cues at times to understand  Memory/Recall Ability Memory/Recall Ability : Current season;Location of own room;That he or she is in a hospital/hospital unit   PMSV Assessment  PMSV Trial Intelligibility: Intelligible  Bedside Swallowing Assessment General Date of Onset: 08/19/22 Previous Swallow Assessment: BSE dys 3 textures and thin liquids Diet Prior to this Study: Dysphagia 3 (soft);Thin liquids Respiratory Status: Room air History of Recent Intubation: No Behavior/Cognition: Alert;Cooperative;Pleasant mood Oral Cavity - Dentition: Adequate natural dentition Vision: Functional for self-feeding Volitional Cough: Strong Volitional Swallow: Able to elicit  Oral Care Assessment Oral Assessment  (WDL): Exceptions to WDL Lips: Symmetrical Teeth: Intact Level of Consciousness: Alert Is patient on any of following O2 devices?: None of the above Nutritional status: Dysphagia Oral Assessment Risk : High Risk Ice Chips Ice chips: Not tested Thin Liquid Thin Liquid: Impaired Presentation: Cup;Straw Pharyngeal   Phase Impairments: Suspected delayed Swallow;Other (comments) Nectar Thick Nectar Thick Liquid: Not tested Honey Thick Honey Thick Liquid: Not tested Puree Puree: Not tested Solid Solid: Impaired Oral Phase Functional Implications:  (bite inner cheeks with regular textures) Pharyngeal Phase Impairments: Suspected delayed Swallow BSE Assessment Risk for Aspiration Impact on safety and function: No limitations;Mild aspiration risk Other Related Risk Factors: Deconditioning  Short Term Goals: Week 1: SLP Short Term Goal 1 (Week 1): Pt will consume regular textures over x2 trials with limits impairments such as bitting of inner buccal lining and no overt s/s aspiration, pior to upgrade of solids. SLP Short Term Goal 2 (Week 1): Pt will demonstrate recall of daily and complex infromation utilzing internal and external strategies with supervision A verbal cues. SLP Short Term Goal 3 (Week 1): Pt will complete complex problem solving tasks (medication/money/time management) with supervision A verbal cues.  Refer to Care Plan for Long Term Goals  Recommendations for other services: None   Discharge Criteria: Patient will be discharged from SLP if patient refuses treatment 3 consecutive times without medical reason, if treatment goals not met, if there is a change in medical status, if patient makes no progress towards goals or if patient is discharged from hospital.  The above assessment, treatment plan, treatment alternatives and goals were discussed and mutually agreed upon: by patient  Hakim Minniefield  [facility] 08/23/2022, 2:55 PM

## 2022-08-23 NOTE — Evaluation (Signed)
Occupational Therapy Assessment and Plan  Patient Details  Name: [name] MRN: [id number] Date of Birth: 02-22-1941  OT Diagnosis: abnormal posture, cognitive deficits, hemiplegia affecting non-dominant side, and muscle weakness (generalized) Rehab Potential: Rehab Potential (ACUTE ONLY): Good ELOS: 2 weeks   Today's Date: 08/23/2022 OT Individual Time: 0800-0900 OT Individual Time Calculation (min): 60 min     Hospital Problem: Principal Problem:   Right thalamic stroke [facility])   Past Medical History:  Past Medical History:  Diagnosis Date   Anemia    Arthritis    Asthma    uses inhaler just prior to surgery to avoid attack   Back pain    from previous injury   Complication of anesthesia    has woken  up during 2 different surgery   Depression    no current issue/treatment; situation   Gallstones    GERD (gastroesophageal reflux disease)    Hiatal hernia    patient does NOT have nerve/muscle disease   History of kidney stones    HLD (hyperlipidemia)    HTN (hypertension)    Hypothyroidism    Kidney stones    Knee pain    Non-diabetic pancreatic hormone dysfunction years   pt. 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   Pancreatitis    Pneumonia    Seizures ([place])    caused by dye injected during a procedure   Shortness of breath    with exertion   Sinus problem    frequent infections/congestion   Stroke ([place]) 2021   reports having CVA in 2021 and having mini strokes before that   Thyroid disease    Past Surgical History:  Past Surgical History:  Procedure Laterality Date   ABDOMINAL HYSTERECTOMY     APPENDECTOMY     CARPAL TUNNEL RELEASE  10+ years ago   bilateral   EYE SURGERY  3 yrs ago   bilateral cataracts   FOOT OSTEOTOMY  6 weeks ago   Left foot: great, 2nd & 3rd   FOOT OSTEOTOMY  5 years ago   Right great toe   HAND SURGERY Bilateral 2011-most recent   multiple hand surgeries, 2 on left, 3 on right   KNEE ARTHROPLASTY Right 04/28/2022    Procedure: COMPUTER ASSISTED TOTAL KNEE ARTHROPLASTY;  Surgeon: Dereck Leep, MD;  Location: [facility] ORS;  Service: Orthopedics;  Laterality: Right;   LOOP RECORDER INSERTION N/A 05/16/2020   Procedure: LOOP RECORDER INSERTION;  Surgeon: Isaias Cowman, MD;  Location: [place] CV LAB;  Service: Cardiovascular;  Laterality: N/A;   NASAL SINUS SURGERY  most recent 7-8 yrs ago   7 sinus surgeries    TRIGGER FINGER RELEASE  11/19/2011   Procedure: RELEASE TRIGGER FINGER/A-1 PULLEY;  Surgeon: Wynonia Sours, MD;  Location: [place];  Service: Orthopedics;  Laterality: Right;  release a-1 pulley right index finger and cyst removal   WRIST GANGLION EXCISION  1980's   right    Assessment & Plan Clinical Impression:   Pt is a 81 y/o female admitted 9/12 with L sided weakness and aphasia.  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 PMH includes: HTN, seizure, CVA, shingles, complex migranes, anxiety, R TKA 05/2022    Patient currently requires mod-max with basic self-care skills secondary to muscle weakness, decreased cardiorespiratoy endurance, abnormal tone, unbalanced muscle activation, and decreased coordination, decreased attention to left, decreased attention, decreased awareness, decreased problem solving, decreased safety awareness, and decreased memory, and decreased sitting  balance, decreased standing balance, decreased postural control, hemiplegia, and decreased balance strategies.  Prior to hospitalization, patient could complete BADL/IADL with modified independent .  Patient will benefit from skilled intervention to decrease level of assist with basic self-care skills prior to discharge home with care partner.  Anticipate patient will require 24 hour supervision and follow up outpatient.  OT - End of Session Activity Tolerance: Tolerates 30+ min activity with multiple rests Endurance Deficit: Yes OT Assessment Rehab Potential (ACUTE ONLY):  Good OT Barriers to Discharge: Inaccessible home environment;Incontinence OT Patient demonstrates impairments in the following area(s): Balance;Cognition;Endurance;Motor;Perception;Safety;Sensory;Vision OT Basic ADL's Functional Problem(s): Grooming;Bathing;Dressing;Toileting;Eating OT Transfers Functional Problem(s): Toilet;Tub/Shower OT Additional Impairment(s): Fuctional Use of Upper Extremity OT Plan OT Intensity: Minimum of 1-2 x/day, 45 to 90 minutes OT Frequency: 5 out of 7 days OT Duration/Estimated Length of Stay: 2 weeks OT Treatment/Interventions: Balance/vestibular training;Discharge planning;Functional electrical stimulation;Pain management;Self Care/advanced ADL retraining;Therapeutic Activities;UE/LE Coordination activities;Visual/perceptual remediation/compensation;Therapeutic Exercise;Skin care/wound managment;Patient/family education;Disease mangement/prevention;Functional mobility training;Cognitive remediation/compensation;Community reintegration;DME/adaptive equipment instruction;Psychosocial support;Neuromuscular re-education;Splinting/orthotics;UE/LE Strength taining/ROM;Wheelchair propulsion/positioning OT Self Feeding Anticipated Outcome(s): S OT Basic Self-Care Anticipated Outcome(s): S OT Toileting Anticipated Outcome(s): S OT Bathroom Transfers Anticipated Outcome(s): S OT Recommendation Patient destination: Home Follow Up Recommendations: Outpatient OT Equipment Recommended: To be determined   OT Evaluation Precautions/Restrictions  Precautions Precautions: Fall Restrictions Weight Bearing Restrictions: No General Chart Reviewed: Yes Family/Caregiver Present: Yes (husband) Vital Signs Therapy Vitals Temp: 98.2 F (36.8 C) Temp Source: Oral Pulse Rate: 74 Resp: 18 BP: (!) 171/84 Patient Position (if appropriate): Lying Oxygen Therapy SpO2: 95 % O2 Device: Room Air Pain Pain Assessment Pain Score: 0-No pain Home Living/Prior Functioning Home  Living Family/patient expects to be discharged to:: Private residence Living Arrangements: Spouse/significant other Available Help at Discharge: Family, Available 24 hours/day Type of Home: Mudlogger of Steps: 7 Entrance Stairs-Rails: Right, Left Home Layout: Two level, Able to live on main level with bedroom/bathroom Bathroom Shower/Tub: Multimedia programmer: Handicapped height Additional Comments: Owns 3WW Prior Function Level of Independence: Independent with basic ADLs, Independent with homemaking with ambulation  Able to Take Stairs?: Yes Leisure: Hobbies-yes (Comment) (antiquing) Vision Baseline Vision/History: 1 Wears glasses Ability to See in Adequate Light: 0 Adequate Patient Visual Report: No change from baseline Additional Comments: continue to assess, able to scan in B directions, Pt with L eyelid not closing fully at baseline Perception  Perception: Impaired Inattention/Neglect: Does not attend to left visual field (mild) Praxis Praxis: Intact Cognition Cognition Overall Cognitive Status: Impaired/Different from baseline Orientation Level: Person;Place;Situation Person: Oriented Place: Oriented Situation: Oriented Safety/Judgment: Impaired Brief Interview for Mental Status (BIMS) Repetition of Three Words (First Attempt): 3 Temporal Orientation: Year: Correct Temporal Orientation: Month: Accurate within 5 days Temporal Orientation: Day: Correct Recall: "Sock": No, could not recall Recall: "Blue": Yes, no cue required Recall: "Bed": No, could not recall BIMS Summary Score: 11 Sensation Sensation Light Touch: Appears Intact Hot/Cold: Impaired by gross assessment Proprioception: Impaired by gross assessment Coordination Gross Motor Movements are Fluid and Coordinated: No Fine Motor Movements are Fluid and Coordinated: No Finger Nose Finger Test: unable LUE Motor  Motor Motor: Hemiplegia;Abnormal tone  Trunk/Postural  Assessment  Cervical Assessment Cervical Assessment: Within Functional Limits Thoracic Assessment Thoracic Assessment:  (rounded shoudlers) Lumbar Assessment Lumbar Assessment:  (post pelvic tilt) Postural Control Postural Control:  (insufficient L)  Balance Balance Balance Assessed: Yes Dynamic Sitting Balance Dynamic Sitting - Level of Assistance: 5: Stand by assistance Static Standing Balance Static Standing - Level of Assistance: 4: Min  assist Dynamic Standing Balance Dynamic Standing - Level of Assistance: 3: Mod assist Extremity/Trunk Assessment RUE Assessment RUE Assessment: Within Functional Limits LUE Assessment LUE Assessment: Exceptions to [facility] General Strength Comments: flexor synergy developing- trace activation of flexors LUE Body System: Neuro Brunstrum levels for arm and hand: Arm;Hand Brunstrum level for arm: Stage II Synergy is developing Brunstrum level for hand: Stage II Synergy is developing  Care Tool Care Tool Self Care Eating        Oral Care    Oral Care Assist Level: Moderate Assistance - Patient 50 - 74%    Bathing   Body parts bathed by patient: Right arm;Chest;Abdomen;Front perineal area;Right upper leg;Left upper leg;Face Body parts bathed by helper: Left arm;Buttocks;Right lower leg;Left lower leg   Assist Level: Moderate Assistance - Patient 50 - 74%    Upper Body Dressing(including orthotics)   What is the patient wearing?: Pull over shirt   Assist Level: Maximal Assistance - Patient 25 - 49%    Lower Body Dressing (excluding footwear)   What is the patient wearing?: Pants;Underwear/pull up Assist for lower body dressing: Maximal Assistance - Patient 25 - 49%    Putting on/Taking off footwear     Assist for footwear: Dependent - Patient 0%       Care Tool Toileting Toileting activity   Assist for toileting: Maximal Assistance - Patient 25 - 49%     Care Tool Bed Mobility Roll left and right activity        Sit to lying  activity        Lying to sitting on side of bed activity         Care Tool Transfers Sit to stand transfer   Sit to stand assist level: Minimal Assistance - Patient > 75%    Chair/bed transfer   Chair/bed transfer assist level: Moderate Assistance - Patient 50 - 74%     Toilet transfer   Assist Level: Moderate Assistance - Patient 50 - 74%     Care Tool Cognition  Expression of Ideas and Wants    Understanding Verbal and Non-Verbal Content     Memory/Recall Ability     Refer to Care Plan for Long Term Goals  SHORT TERM GOAL WEEK 1 OT Short Term Goal 1 (Week 1): Pt will complete toilet transfers wiht LRAD and MIN A OT Short Term Goal 2 (Week 1): Pt will don shirt wiht MOD A OT Short Term Goal 3 (Week 1): Pt will recall hemi strategies wiht no more than min cuing OT Short Term Goal 4 (Week 1): Pt will thread BLE into pants with AE PRN  Recommendations for other services: Therapeutic Recreation  Pet therapy, Stress management, and Outing/community reintegration   Skilled Therapeutic Intervention Pt received inbed with husband present. Pt agreeable to OT after edu re OT role/purpose, CIR, ELOS and CVA recovery. Pt participates in ADL retraining as stated below with education and instruction on hemi techniques d/t impaired activation in L hemibody. Pt UE impairment>LE, but LLE still can buckle in stance with decreased attention or poor setup. Transfers and sit to stand min-MOD A with L knee block and cuing for safety dtrategies d/t mild impulsivity. Exited session with pt seated in w/c, exit alarm on and call light in reach. Let NT know there was no alarm box for w/c pad alarm.    ADL ADL Grooming: Moderate assistance Where Assessed-Grooming: Standing at sink Upper Body Bathing: Minimal assistance Where Assessed-Upper Body Bathing: Shower Lower Body Bathing:  Moderate assistance Where Assessed-Lower Body Bathing: Shower Upper Body Dressing: Maximal assistance Where  Assessed-Upper Body Dressing: Wheelchair Lower Body Dressing: Maximal assistance Where Assessed-Lower Body Dressing: Wheelchair Toileting: Maximal assistance Where Assessed-Toileting: Glass blower/designer: Moderate assistance;Minimal assistance Toilet Transfer Method: Stand pivot Toilet Transfer Equipment: Energy manager: Minimal assistance;Moderate assistance Social research officer, government Method: Radiographer, therapeutic: Primary school teacher Stand to Sit: Minimal Assistance - Patient > 75%   Discharge Criteria: Patient will be discharged from OT if patient refuses treatment 3 consecutive times without medical reason, if treatment goals not met, if there is a change in medical status, if patient makes no progress towards goals or if patient is discharged from hospital.  The above assessment, treatment plan, treatment alternatives and goals were discussed and mutually agreed upon: by patient  Tonny Branch 08/23/2022, 9:07 AM

## 2022-08-24 MED ORDER — AMLODIPINE BESYLATE 5 MG PO TABS
5.0000 mg | ORAL_TABLET | Freq: Every day | ORAL | Status: DC
  Administered 2022-08-24 – 2022-09-01 (×9): 5 mg via ORAL
  Filled 2022-08-24 (×9): qty 1

## 2022-08-24 NOTE — Progress Notes (Signed)
PROGRESS NOTE   Subjective/Complaints:  Patient seen at bedside. No acute complaints. No events overnight. Working on increasing PO fluids.  Small BM 9/16.       08/24/2022    5:07 AM 08/24/2022   12:50 AM 08/23/2022    9:26 PM  Vitals with BMI  Systolic 960 454 098  Diastolic 65 76 73  Pulse 65 68 67     ROS: Denies fevers, chills, N/V, abdominal pain, constipation, diarrhea, SOB, cough, chest pain, new weakness or paraesthesias.    Objective:   No results found. Recent Labs    08/23/22 0612  WBC 10.4  HGB 13.0  HCT 39.7  PLT 317    Recent Labs    08/23/22 0612  NA 137  K 3.8  CL 102  CO2 24  GLUCOSE 79  BUN 17  CREATININE 1.01*  CALCIUM 9.2     Intake/Output Summary (Last 24 hours) at 08/24/2022 1191 Last data filed at 08/23/2022 1841 Gross per 24 hour  Intake 240 ml  Output --  Net 240 ml         Physical Exam: Vital Signs reviewed as above.  Constitutional: No apparent distress. Appropriate appearance for age.  HENT: Atraumatic, normocephalic. + l facial droop Eyes: PERRL. No apparent EOM deficits.  Cardiovascular: RRR, no murmurs/rub/gallops. No Edema. Peripheral pulses 2+  Respiratory: CTAB. No rales, rhonchi, or wheezing. On RA.  Abdomen: + bowel sounds, normoactive, nondistended  Skin: C/D/I. No apparent lesions.  MSK: Moving all 4 limbs in bed  Neurologic exam:  Cognition: AAO to person, place, time and event. Mild memory deficit  PE from prior encounter: Vitals and nursing note reviewed.    Constitutional: No apparent distress. Appropriate appearance for age.  HENT: No JVD. Neck Supple. Trachea midline. Atraumatic, normocephalic. + scleral icterus Eyes: PERRLA. EOMI. Visual fields grossly intact.  Cardiovascular: RRR, no murmurs/rub/gallops. No Edema. Peripheral pulses 2+  Respiratory: CTAB. No rales, rhonchi, or wheezing. On RA.  Abdomen: + bowel sounds, normoactive. No  distention or tenderness.  GU: Not examined.  Skin: Skin is warm and dry.     Comments: Paper thin with multiple ecchymotic areas LUE>RUE.   MSK:      No apparent deformity.      Strength:                LUE: 3/5 SA, 2/5 EF, 2/5 EE, 0/5 WE, 0/5 FF, 0/5 FA                 RUE: 5/5 SA, 5/5 EF, 5/5 EE, 5/5 WE, 5/5 FF, 5/5 FA                 RLE: 5/5 HF, 5/5 KE, 5/5 DF, 5/5 EHL, 5/5 PF                 LLE:  5/5 HF, 5/5 KE, 5/5 DF, 5/5 EHL, 5/5 PF    Neurologic exam:  Cognition: AAO to person, place, time and event. Cannot perform addition or spell WORLD backwards.  Language: Fluent, No substitutions or neoglisms. Mild dysarthria. Names 3/3 objects correctly.  Memory: Recalls 3/3 objects at 5 minutes.  Insight:  Good insight into current condition.  Mood: Anxious mood.  Sensation: To light touch intact in BL UEs and LEs  Reflexes: 2+ in BL UE and LEs. Negative Hoffman's and babinski signs bilaterally.  CN: + L facial droop Coordination: No apparent tremors. Spasticity: MAS 0 in all extremities.        Assessment/Plan: 1. Functional deficits which require 3+ hours per day of interdisciplinary therapy in a comprehensive inpatient rehab setting. Physiatrist is providing close team supervision and 24 hour management of active medical problems listed below. Physiatrist and rehab team continue to assess barriers to discharge/monitor patient progress toward functional and medical goals  Care Tool:  Bathing    Body parts bathed by patient: Right arm, Chest, Abdomen, Front perineal area, Right upper leg, Left upper leg, Face   Body parts bathed by helper: Left arm, Buttocks, Right lower leg, Left lower leg     Bathing assist Assist Level: Moderate Assistance - Patient 50 - 74%     Upper Body Dressing/Undressing Upper body dressing   What is the patient wearing?: Pull over shirt    Upper body assist Assist Level: Maximal Assistance - Patient 25 - 49%    Lower Body  Dressing/Undressing Lower body dressing      What is the patient wearing?: Pants, Underwear/pull up     Lower body assist Assist for lower body dressing: Maximal Assistance - Patient 25 - 49%     Toileting Toileting    Toileting assist Assist for toileting: Maximal Assistance - Patient 25 - 49%     Transfers Chair/bed transfer  Transfers assist     Chair/bed transfer assist level: Moderate Assistance - Patient 50 - 74%     Locomotion Ambulation   Ambulation assist      Assist level: 2 helpers (mod assist of 1 and +2 w/c follow) Assistive device: Other (comment) (wall hand rail on right side progressing to bilateral HHA) Max distance: 80f no turning   Walk 10 feet activity   Assist     Assist level: 2 helpers (mod assist of 1 and +2 w/c follow) Assistive device: Other (comment) (wall hand rail on right side progressing to bilateral HHA)   Walk 50 feet activity   Assist Walk 50 feet with 2 turns activity did not occur: Safety/medical concerns         Walk 150 feet activity   Assist Walk 150 feet activity did not occur: Safety/medical concerns         Walk 10 feet on uneven surface  activity   Assist Walk 10 feet on uneven surfaces activity did not occur: Safety/medical concerns         Wheelchair     Assist Is the patient using a wheelchair?: Yes (Transport to and from therapy gym for energy conservation and time management.) Type of Wheelchair: Manual    Wheelchair assist level: Dependent - Patient 0% Max wheelchair distance: 150    Wheelchair 50 feet with 2 turns activity    Assist        Assist Level: Dependent - Patient 0%   Wheelchair 150 feet activity     Assist      Assist Level: Dependent - Patient 0%   Blood pressure (!) 159/65, pulse 65, temperature 97.8 F (36.6 C), resp. rate 16, height 4' 11.5" (1.511 m), weight 51.4 kg, SpO2 96 %.  Medical Problem List and Plan: 1. Functional deficits  secondary to R thalamic and corona radiata CVA             -  patient may shower             -ELOS/Goals: 10-14 days             -refusing statin d/t prior pancreatic issues  2.  Antithrombotics: -DVT/anticoagulation:  Pharmaceutical: Lovenox             -antiplatelet therapy: DAPT X 3 weeks followed by ASA alone.  3. Post herpetic neuralgia/Pain Management: Was on gabapentin for left facial and scalp pain.  --will resume gabapentin. .  4. Mood/Behavior/Sleep: LCSW to follow for evaluation and support.              -antipsychotic agents: N/A 5. Neuropsych/cognition: This patient is capable of making decisions on her own behalf. 6. Skin/Wound Care: Routine pressure relief measures.  7. Fluids/Electrolytes/Nutrition: Monitor I/O. Check CMET in am.              - 9/16 - Mild AKI. Encourage PO fluids, recheck Monday.     Latest Ref Rng & Units 08/23/2022    6:12 AM 08/21/2022    7:09 AM 08/20/2022    3:38 AM  BMP  Glucose 70 - 99 mg/dL 79  94  122   BUN 8 - 23 mg/dL '17  7  7   '$ Creatinine 0.44 - 1.00 mg/dL 1.01  0.73  0.83   Sodium 135 - 145 mmol/L 137  139  135   Potassium 3.5 - 5.1 mmol/L 3.8  3.3  3.4   Chloride 98 - 111 mmol/L 102  108  97   CO2 22 - 32 mmol/L '24  21  25   '$ Calcium 8.9 - 10.3 mg/dL 9.2  8.8  8.8     8. HTN: Monitor BP TID. Continue inderal and Cozaar.             - Per Neurology notes, permissive up to 220/110, target <200 and gradually decrease over 5-7 days (target date for normal 9/19)            - Hydralazine PRN 10 mg for SBP >220           - 9/16 - significant BP downtrend in last 24 hours; monitor           - 9/17 - remains elevated but sustained <180 SBP; adding Norvasc 5 mg daily. Will take 2-3 days for effect, but cautious to increase Losartan given AKI.     08/24/2022    7:07 PM 08/24/2022    1:41 PM 08/24/2022    9:43 AM  Vitals with BMI  Systolic 967 591 638  Diastolic 74 63 63  Pulse 79 70 61      9. H/o occasional Headaches: Used ASA at home prn.  . --continue Inderal. Resume tramadol prn.  10. H/o anxiety d/o: Continue Cymbalta.  11. Hypokalemia: Likely dilutional from IVF--will supplement today X 1  --recheck BMET in am.  - 3.8 9/16; monitor  12. Leucocytosis: Resolving 16.8-->11.6 and question reactive --Has had issues with elevation in the past-->chronic?              - admission labs stable   13. 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 H/o Asthma: Resume Singulair. Albuterol MDI prn as rescue.    15. Dysphagia: Multifactorial due to facial weakness, numbness? difficulty breathing due to sinus surgeries etc             --pt and  husband would like to try D2 diet.              -SLP consult  16. Constipation            - Small BM 9/15, 9/16, 9/17            - 9/16 - add Senna S 2 tabs QHS + Miralax BID         LOS: 2 days A FACE TO FACE EVALUATION WAS PERFORMED  Gertie Gowda 08/24/2022, 8:38 AM

## 2022-08-25 DIAGNOSIS — I6381 Other cerebral infarction due to occlusion or stenosis of small artery: Secondary | ICD-10-CM | POA: Diagnosis not present

## 2022-08-25 LAB — BASIC METABOLIC PANEL
Anion gap: 11 (ref 5–15)
BUN: 13 mg/dL (ref 8–23)
CO2: 24 mmol/L (ref 22–32)
Calcium: 9.4 mg/dL (ref 8.9–10.3)
Chloride: 104 mmol/L (ref 98–111)
Creatinine, Ser: 0.88 mg/dL (ref 0.44–1.00)
GFR, Estimated: >60 mL/min (ref >60)
Glucose, Bld: 84 mg/dL (ref 70–99)
Potassium: 3.6 mmol/L (ref 3.5–5.1)
Sodium: 139 mmol/L (ref 135–145)

## 2022-08-25 NOTE — IPOC Note (Signed)
Overall Plan of Care [facility]) Patient Details Name: [name] MRN: [id number] DOB: 1941/04/05  Admitting Diagnosis: Right thalamic stroke [facility])  Hospital Problems: Principal Problem:   Right thalamic stroke [facility])     Functional Problem List: Nursing Bladder, Bowel, Pain, Perception, Safety, Endurance, Sensory, Medication Management, Motor  PT Balance, Endurance, Motor, Pain, Safety, Skin Integrity, Perception  OT Balance, Cognition, Endurance, Motor, Perception, Safety, Sensory, Vision  SLP Cognition  TR         Basic ADL's: OT Grooming, Bathing, Dressing, Toileting, Eating     Advanced  ADL's: OT       Transfers: PT Bed Mobility, Bed to Chair, Teacher, early years/pre, Tub/Shower     Locomotion: PT Ambulation, Stairs     Additional Impairments: OT Fuctional Use of Upper Extremity  SLP Swallowing, Social Cognition   Problem Solving, Memory  TR      Anticipated Outcomes Item Anticipated Outcome  Self Feeding S  Swallowing  Mod I   Basic self-care  S  Toileting  S   Bathroom Transfers S  Bowel/Bladder  continent x 2  Transfers  supervision using LRAD  Locomotion  supervision using LRAD  Communication     Cognition  Mod I  Pain  less than 3  Safety/Judgment  remain fall free while in rehab   Therapy Plan: PT Intensity: Minimum of 1-2 x/day ,45 to 90 minutes PT Frequency: 5 out of 7 days PT Duration Estimated Length of Stay: 14-18 days OT Intensity: Minimum of 1-2 x/day, 45 to 90 minutes OT Frequency: 5 out of 7 days OT Duration/Estimated Length of Stay: 2 weeks SLP Intensity: Minumum of 1-2 x/day, 30 to 90 minutes SLP Frequency: 3 to 5 out of 7 days SLP Duration/Estimated Length of Stay: 2 weeks   Team Interventions: Nursing Interventions Patient/Family Education, Disease Management/Prevention, Discharge Planning, Bladder Management, Pain Management, Cognitive Remediation/Compensation, Psychosocial Support, Bowel Management, Medication Management,  Dysphagia/Aspiration Precaution Training  PT interventions Ambulation/gait training, Training and development officer, Community reintegration, Disease management/prevention, Discharge planning, DME/adaptive equipment instruction, Functional mobility training, Pain management, Psychosocial support, Splinting/orthotics, Therapeutic Activities, UE/LE Strength taining/ROM, UE/LE Coordination activities, Therapeutic Exercise, Stair training, Skin care/wound management, Patient/family education, Neuromuscular re-education, Functional electrical stimulation, Cognitive remediation/compensation, Visual/perceptual remediation/compensation  OT Interventions Balance/vestibular training, Discharge planning, Functional electrical stimulation, Pain management, Self Care/advanced ADL retraining, Therapeutic Activities, UE/LE Coordination activities, Visual/perceptual remediation/compensation, Therapeutic Exercise, Skin care/wound managment, Patient/family education, Disease mangement/prevention, Functional mobility training, Cognitive remediation/compensation, Community reintegration, Engineer, drilling, Psychosocial support, Neuromuscular re-education, Splinting/orthotics, UE/LE Strength taining/ROM, Wheelchair propulsion/positioning  SLP Interventions Cognitive remediation/compensation, English as a second language teacher, Functional tasks, Patient/family education, Medication managment, Internal/external aids, Dysphagia/aspiration precaution training  TR Interventions    SW/CM Interventions Discharge Planning, Psychosocial Support, Patient/Family Education   Barriers to Discharge MD  Medical stability  Nursing Decreased caregiver support, Home environment access/layout 2 level house with B/B on main level; 3 ste @ front 0 rails; 6 ste @ back with rails on both sides  PT Home environment access/layout    OT Inaccessible home environment, Incontinence    SLP      SW Decreased caregiver support, Lack of/limited family  support     Team Discharge Planning: Destination: PT-Home ,OT- Home , SLP-Home Projected Follow-up: PT-Outpatient PT, 24 hour supervision/assistance, OT-  Outpatient OT, SLP-Outpatient SLP Projected Equipment Needs: PT-To be determined, OT- To be determined, SLP-None recommended by SLP Equipment Details: PT- , OT-  Patient/family involved in discharge planning: PT- Patient, Family member/caregiver,  OT-Patient, SLP-Patient, Family member/caregiver  MD ELOS: 7-10d  Medical Rehab Prognosis:  Good Assessment: The patient has been admitted for CIR therapies with the diagnosis of RIght thalamic infarct. The team will be addressing functional mobility, strength, stamina, balance, safety, adaptive techniques and equipment, self-care, bowel and bladder mgt, patient and caregiver education, management of uncontrolled HTN. Goals have been set at sup/mod I. Anticipated discharge destination is Home .        See Team Conference Notes for weekly updates to the plan of care

## 2022-08-25 NOTE — Progress Notes (Signed)
Speech Language Pathology Daily Session Note  Patient Details  Name: [name] MRN: [id number] Date of Birth: 02/26/1941  Today's Date: 08/25/2022 SLP Individual Time: 0725-0815 SLP Individual Time Calculation (min): 50 min  Short Term Goals: Week 1: SLP Short Term Goal 1 (Week 1): Pt will consume regular textures over x2 trials with limits impairments such as bitting of inner buccal lining and no overt s/s aspiration, pior to upgrade of solids. SLP Short Term Goal 2 (Week 1): Pt will demonstrate recall of daily and complex infromation utilzing internal and external strategies with supervision A verbal cues. SLP Short Term Goal 3 (Week 1): Pt will complete complex problem solving tasks (medication/money/time management) with supervision A verbal cues.  Skilled Therapeutic Interventions: Skilled treatment session focused on cognitive goals. Upon arrival, patient was awake in bed. Patient was very engaged and verbose throughout functional conversation regarding previous level of function and emergent awareness of current deficits. Patient with awareness of verbosity and its impact on her attention with ability to redirect her attention to tasks with overall supervision level verbal cues. SLP facilitated session by providing overall Min verbal cues for organization during a complex money management task. The task was unable to be completed due to time constraints and SLP will attempt to complete task during next session. Patient left upright in bed with alarm on and all needs within reach. Continue with current plan of care.      Pain No/Denies Pain   Therapy/Group: Individual Therapy  [name] 08/25/2022, 12:37 PM

## 2022-08-25 NOTE — Progress Notes (Signed)
Inpatient Rehabilitation  Patient information reviewed and entered into eRehab system by Zacchary Pompei M. Elsia Lasota, M.A., CCC/SLP, PPS Coordinator.  Information including medical coding, functional ability and quality indicators will be reviewed and updated through discharge.    

## 2022-08-25 NOTE — Progress Notes (Signed)
Patient ID: [name], female   DOB: 1941-05-17, 81 y.o.   MRN: [id number]  SW left message for pt husband [name] to introduce self, explain role, discuss discharge process, ELOS, and SW will follow-up after team conference with updates.   *SW went to pt room to complete assessment, but beginning OT session. SW introduced self to pt and pt husband. SW provided statement of service. SW will follow-up to complete assessment at another time. Both aware SW will follow-up with updates after team conference.   Loralee Pacas, MSW, [place] Office: [phone] Cell: [phone] Fax: [phone]

## 2022-08-25 NOTE — Progress Notes (Signed)
PROGRESS NOTE   Subjective/Complaints:  Elevated BP, labs reviewed   Small BM 9/16.       08/25/2022    7:07 AM 08/25/2022    4:58 AM 08/24/2022    7:07 PM  Vitals with BMI  Systolic [phone]  Diastolic 76 76 74  Pulse  65 79     ROS: Denies fevers, chills, N/V, abdominal pain, constipation, diarrhea, SOB, cough, chest pain, new weakness or paraesthesias.    Objective:   No results found. Recent Labs    08/23/22 0612  WBC 10.4  HGB 13.0  HCT 39.7  PLT 317    Recent Labs    08/23/22 0612 08/25/22 0526  NA 137 139  K 3.8 3.6  CL 102 104  CO2 24 24  GLUCOSE 79 84  BUN 17 13  CREATININE 1.01* 0.88  CALCIUM 9.2 9.4     Intake/Output Summary (Last 24 hours) at 08/25/2022 0841 Last data filed at 08/24/2022 2236 Gross per 24 hour  Intake 495 ml  Output --  Net 495 ml         Physical Exam: Vital Signs reviewed as above.  Constitutional: No apparent distress. Appropriate appearance for age.   General: No acute distress Mood and affect are appropriate Heart: Regular rate and rhythm no rubs murmurs or extra sounds Lungs: Clear to auscultation, breathing unlabored, no rales or wheezes Abdomen: Positive bowel sounds, soft nontender to palpation, nondistended Extremities: No clubbing, cyanosis, or edema  GU: Not examined.  Skin: Skin is warm and dry.     Comments: Paper thin with multiple ecchymotic areas LUE>RUE.   MSK:      No apparent deformity.      Strength:                LUE: 3/5 SA, 2/5 EF, 2/5 EE, 0/5 WE, 0/5 FF, 0/5 FA                 RUE: 5/5 SA, 5/5 EF, 5/5 EE, 5/5 WE, 5/5 FF, 5/5 FA                 RLE: 5/5 HF, 5/5 KE, 5/5 DF, 5/5 EHL, 5/5 PF                 LLE:  5/5 HF, 5/5 KE, 5/5 DF, 5/5 EHL, 5/5 PF    Neurologic exam:  Cognition: orient to person and place not day of week "Tues" oriented to month and year  Language: Fluent, No substitutions or neoglisms. Mild dysarthria.    Mood: Anxious mood.  Sensation: To light touch intact in BL UEs and LEs  Reflexes: 2+ in BL UE and LEs. Negative Hoffman's and babinski signs bilaterally.  CN: + L facial droop Coordination: No apparent tremors. Spasticity: MAS 0 in all extremities.        Assessment/Plan: 1. Functional deficits which require 3+ hours per day of interdisciplinary therapy in a comprehensive inpatient rehab setting. Physiatrist is providing close team supervision and 24 hour management of active medical problems listed below. Physiatrist and rehab team continue to assess barriers to discharge/monitor patient progress toward functional and medical goals  Care Tool:  Bathing    Body parts bathed by patient: Right arm, Chest, Abdomen, Front perineal area, Right upper leg, Left upper leg, Face   Body parts bathed by helper: Left arm, Buttocks, Right lower leg, Left lower leg     Bathing assist Assist Level: Moderate Assistance - Patient 50 - 74%     Upper Body Dressing/Undressing Upper body dressing   What is the patient wearing?: Pull over shirt    Upper body assist Assist Level: Maximal Assistance - Patient 25 - 49%    Lower Body Dressing/Undressing Lower body dressing      What is the patient wearing?: Pants, Underwear/pull up     Lower body assist Assist for lower body dressing: Maximal Assistance - Patient 25 - 49%     Toileting Toileting    Toileting assist Assist for toileting: Maximal Assistance - Patient 25 - 49%     Transfers Chair/bed transfer  Transfers assist     Chair/bed transfer assist level: Moderate Assistance - Patient 50 - 74%     Locomotion Ambulation   Ambulation assist      Assist level: 2 helpers (mod assist of 1 and +2 w/c follow) Assistive device: Other (comment) (wall hand rail on right side progressing to bilateral HHA) Max distance: 28f no turning   Walk 10 feet activity   Assist     Assist level: 2 helpers (mod assist of 1 and +2  w/c follow) Assistive device: Other (comment) (wall hand rail on right side progressing to bilateral HHA)   Walk 50 feet activity   Assist Walk 50 feet with 2 turns activity did not occur: Safety/medical concerns         Walk 150 feet activity   Assist Walk 150 feet activity did not occur: Safety/medical concerns         Walk 10 feet on uneven surface  activity   Assist Walk 10 feet on uneven surfaces activity did not occur: Safety/medical concerns         Wheelchair     Assist Is the patient using a wheelchair?: Yes (Transport to and from therapy gym for energy conservation and time management.) Type of Wheelchair: Manual    Wheelchair assist level: Dependent - Patient 0% Max wheelchair distance: 150    Wheelchair 50 feet with 2 turns activity    Assist        Assist Level: Dependent - Patient 0%   Wheelchair 150 feet activity     Assist      Assist Level: Dependent - Patient 0%   Blood pressure (!) 185/76, pulse 65, temperature 98.2 F (36.8 C), temperature source Oral, resp. rate 18, height 4' 11.5" (1.511 m), weight 51.4 kg, SpO2 96 %.  Medical Problem List and Plan: 1. Functional deficits secondary to R thalamic and corona radiata CVA Prior mild CVA had loop recorder placed ~224yrago but has not had evidence of A fib             -patient may shower             -ELOS/Goals: 10-14 days             -refusing statin d/t prior pancreatic issues  2.  Antithrombotics: -DVT/anticoagulation:  Pharmaceutical: Lovenox             -antiplatelet therapy: DAPT X 3 weeks followed by ASA alone.  3. Post herpetic neuralgia/Pain Management: Was on gabapentin for left facial and scalp pain.  --will  resume gabapentin. .  4. Mood/Behavior/Sleep: LCSW to follow for evaluation and support.              -antipsychotic agents: N/A 5. Neuropsych/cognition: This patient is capable of making decisions on her own behalf. 6. Skin/Wound Care: Routine pressure  relief measures.  7. Fluids/Electrolytes/Nutrition: Monitor I/O. Check CMET in am.              - 9/18 - Mild AKI. Encourage PO fluids, resolved     Latest Ref Rng & Units 08/25/2022    5:26 AM 08/23/2022    6:12 AM 08/21/2022    7:09 AM  BMP  Glucose 70 - 99 mg/dL 84  79  94   BUN 8 - 23 mg/dL '13  17  7   '$ Creatinine 0.44 - 1.00 mg/dL 0.88  1.01  0.73   Sodium 135 - 145 mmol/L 139  137  139   Potassium 3.5 - 5.1 mmol/L 3.6  3.8  3.3   Chloride 98 - 111 mmol/L 104  102  108   CO2 22 - 32 mmol/L '24  24  21   '$ Calcium 8.9 - 10.3 mg/dL 9.4  9.2  8.8     8. HTN: Monitor BP TID. Continue inderal and Cozaar.             - Per Neurology notes, permissive up to 220/110, target <200 and gradually decrease over 5-7 days (target date for normal 9/19)            - Hydralazine PRN 10 mg for SBP >220           - 9/16 - significant BP downtrend in last 24 hours; monitor           - 9/17 - remains elevated but sustained <180 SBP; adding Norvasc 5 mg daily. Will take 2-3 days for effect, but cautious to increase Losartan given AKI.     08/25/2022    7:07 AM 08/25/2022    4:58 AM 08/24/2022    7:07 PM  Vitals with BMI  Systolic [phone]  Diastolic 76 76 74  Pulse  65 79   Amlodipine started 9/17 monitor effect for additional 1-2 d prior to adjustment if needed    9. H/o occasional Headaches: Used ASA at home prn. . --continue Inderal. Resume tramadol prn.  10. H/o anxiety d/o: Continue Cymbalta.  11. Hypokalemia: Likely dilutional from IVF--will supplement today X 1  --Normokalemic last 2 readings   12. Leucocytosis: Resolved   13. 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 H/o Asthma: Resume Singulair. Albuterol MDI prn as rescue.    15. Dysphagia: Multifactorial due to facial weakness, numbness? difficulty breathing due to sinus surgeries etc             --pt and husband would like to try D2 diet.              -SLP  consult  16. Constipation            - Small BM 9/15, 9/16, 9/17            - 9/16 - add Senna S 2 tabs QHS + Miralax BID         LOS: 3 days A FACE TO FACE EVALUATION WAS PERFORMED  Charlett Blake 08/25/2022, 8:41 AM

## 2022-08-25 NOTE — Progress Notes (Signed)
Pt not in room.

## 2022-08-25 NOTE — Progress Notes (Signed)
Physical Therapy Session Note  Patient Details  Name: [name] MRN: [id number] Date of Birth: 09-28-1941  Today's Date: 08/25/2022 PT Individual Time: 0905-1000 PT Individual Time Calculation (min): 55 min   Short Term Goals: Week 1:  PT Short Term Goal 1 (Week 1): Pt will perform supine <>sit with CGA consistently. PT Short Term Goal 2 (Week 1): Pt will perform sit <> stand transfers with min assist and LRAD consistently. PT Short Term Goal 3 (Week 1): Pt will perform stand pivot transfers with min assist and LRAD consistently. PT Short Term Goal 4 (Week 1): Pt will amb at least 90 ft with min assist and LRAD.  Skilled Therapeutic Interventions/Progress Updates:     Pt received supine in bed and agrees to therapy. No complaint of pain. Pt performs supine to sit with verbal cues for sequencing and use of bed features, as well as positioning at EOB. Pt performs Stand pivot transfer to [facility] with minA and cues for body mechanics. WC transport to gym for time management. Pt performs sit to stand to RW with CGA and cues for hand placement. Pt ambulates x100' with RW and minA, with cues for upright gaze to improv eposture and balance, as well as increasing L stride length and widening stance width due to close-to-scissoring gait pattern. PT adjusts pt's RW to correct height and pt completes additional 100' with same cueing and assist levels. Following seated rest break, pt performs repeated toe taps on 4 inch platform with R lower extremity, forcing pt to shift weight laterally into L hemibody, with minA/modA for stability and cues to engage L lower extremity extensor muscle groups. X10 prior to rest break. PT demonstrates adequate base of support and pt performs additional x10 toe taps following extended seated rest break. WC transport back to room. Stand pivot back to bed with minA. Left supine with all needs within reach.  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 Restrictions Weight Bearing Restrictions: No   Therapy/Group: Individual Therapy  Breck Coons, PT, DPT 08/25/2022, 5:16 PM

## 2022-08-25 NOTE — Progress Notes (Signed)
Occupational Therapy Session Note  Patient Details  Name: [name] MRN: [id number] Date of Birth: 03/18/1941  Today's Date: 08/25/2022 OT Individual Time: 1300-1415 OT Individual Time Calculation (min): 75 min    Short Term Goals: Week 1:  OT Short Term Goal 1 (Week 1): Pt will complete toilet transfers wiht LRAD and MIN A OT Short Term Goal 2 (Week 1): Pt will don shirt wiht MOD A OT Short Term Goal 3 (Week 1): Pt will recall hemi strategies wiht no more than min cuing OT Short Term Goal 4 (Week 1): Pt will thread BLE into pants with AE PRN  Skilled Therapeutic Interventions/Progress Updates:    Pt greeted sitting EOB with spouse present finishing lunch. Pt agreeable to OT treatment session. Pt completed stand-pivot to wc on R side with min A and verbal cues for safety as pt impulsive to stand. L UR NMR with OT providing joint input through wrist and hand then bringing pt through full shoulder and elbow ROM. Used SAEBO e-stim on wrist extensors and foam placed under hand to promote flexion when cycling off. Pt returned to room to shower. Stand-pivot to shower with min A. L hand placed on grab bar for support while standing to wash buttocks. OT issued wash mit for neuro re-ed while bathing. Pt able to push and pull L hand up and down leg. OT educated on hemi dressing techniques with mod A overall. Pt pivoted back to bed with min A and left semi-reclined with bed alarm on, call bell in reach, and needs met.   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 Restrictions Weight Bearing Restrictions: No Pain: Denies pain   Therapy/Group: Individual Therapy  Elisabeth S Doe 08/25/2022, 2:18 PM 

## 2022-08-25 NOTE — Care Management (Signed)
Inpatient Rehabilitation Center Individual Statement of Services  Patient Name:  [name]  Date:  08/25/2022  Welcome to the [facility].  Our goal is to provide you with an individualized program based on your diagnosis and situation, designed to meet your specific needs.  With this comprehensive rehabilitation program, you will be expected to participate in at least 3 hours of rehabilitation therapies Monday-Friday, with modified therapy programming on the weekends.  Your rehabilitation program will include the following services:  Physical Therapy (PT), Occupational Therapy (OT), Speech Therapy (ST), 24 hour per day rehabilitation nursing, Therapeutic Recreaction (TR), Psychology, Neuropsychology, Care Coordinator, Rehabilitation Medicine, [place], and Other  Weekly team conferences will be held on Tuesdays to discuss your progress.  Your Inpatient Rehabilitation Care Coordinator will talk with you frequently to get your input and to update you on team discussions.  Team conferences with you and your family in attendance may also be held.  Expected length of stay: 14-18 days  Overall anticipated outcome: Supervision  Depending on your progress and recovery, your program may change. Your Inpatient Rehabilitation Care Coordinator will coordinate services and will keep you informed of any changes. Your Inpatient Rehabilitation Care Coordinator's name and contact numbers are listed  below.  The following services may also be recommended but are not provided by the [facility] will be made to provide these services after discharge if needed.  Arrangements include referral to agencies that provide these services.  Your insurance has been verified to be:  [employer]  Your primary doctor  is:  Fulton Reek  Pertinent information will be shared with your doctor and your insurance company.  Inpatient Rehabilitation Care Coordinator:  Cathleen Corti [phone] or [phone]  Information discussed with and copy given to patient by: Rana Snare, 08/25/2022, 9:46 AM

## 2022-08-25 NOTE — Progress Notes (Signed)
Met with patient with husband at bedside. Oriented to rehab and team conference will be Tuesday.  SW will follow up. Discussed binder. Take home at discharge. Has education. Informed that may not drive till cleared by MD. If becomes tearful to let MD or nurse know that it's not uncommon.  Reports that has not been feeling tearful or upset. Informed that dysarthria seems to be progressing well. Patient having minimal word finding issues and was able to self correct. Reports that was taking b/p at home and knew that was higher than normal. Informed that have a record sheet to use for follow up and may want to get use to taking on a regular schedule. Patient did inform that headache was much different from the one that typically has from shingles. Also discussed plavix and that is to decrease risk of stroke only.  Encouraged to change diet that cholesterol was 231 and Trig 361. She reports that trig use to be 3500 and that had seen a specialist that changed her diet and was able to bring down and lost 10 lbs. She and husband both report that she will not take Lipitor and her hormone medication is a fatty drug. Reports that have had discussion of stopping medication but no comfortable taking Lipitor either. Discussed diet changes but would also mention in conference.

## 2022-08-26 DIAGNOSIS — M1712 Unilateral primary osteoarthritis, left knee: Secondary | ICD-10-CM | POA: Diagnosis not present

## 2022-08-26 DIAGNOSIS — I10 Essential (primary) hypertension: Secondary | ICD-10-CM | POA: Diagnosis not present

## 2022-08-26 DIAGNOSIS — I6381 Other cerebral infarction due to occlusion or stenosis of small artery: Secondary | ICD-10-CM | POA: Diagnosis not present

## 2022-08-26 DIAGNOSIS — I69391 Dysphagia following cerebral infarction: Secondary | ICD-10-CM | POA: Diagnosis not present

## 2022-08-26 MED ORDER — DICLOFENAC SODIUM 1 % EX GEL
2.0000 g | Freq: Three times a day (TID) | CUTANEOUS | Status: DC
  Administered 2022-08-26 – 2022-09-05 (×27): 2 g via TOPICAL
  Filled 2022-08-26 (×3): qty 100

## 2022-08-26 NOTE — Progress Notes (Signed)
Physical Therapy Session Note  Patient Details  Name: [name] MRN: [id number] Date of Birth: 1941-02-09  Today's Date: 08/26/2022 PT Individual Time: 1440-1535 PT Individual Time Calculation (min): 55 min   Short Term Goals: Week 1:  PT Short Term Goal 1 (Week 1): Pt will perform supine <>sit with CGA consistently. PT Short Term Goal 2 (Week 1): Pt will perform sit <> stand transfers with min assist and LRAD consistently. PT Short Term Goal 3 (Week 1): Pt will perform stand pivot transfers with min assist and LRAD consistently. PT Short Term Goal 4 (Week 1): Pt will amb at least 90 ft with min assist and LRAD.  Skilled Therapeutic Interventions/Progress Updates:     Patient in recliner with her husband in the room upon PT arrival. Patient alert and agreeable to PT session. Patient reported 6/10 L knee pain with gait during session, RN made aware. PT provided repositioning, rest breaks, and distraction as pain interventions throughout session.   Therapeutic Activity: Transfers: Patient performed sit to/from stand x6 with CGA-supervision. Provided verbal cues for forward weight shift and controlled descent. Patient performed toilet transfer with supervision using grab bar intermittently. Patient was continent of bladder during toileting, performed peri-care, lower body clothing management, and hand hygiene with supervision for incorporation of L upper extremity.   Gait Training:  Patient ambulated >200 feet x2 without an Ad with min A. Ambulated with lateral thrust and mild antalgic gait on the L, noted decreased lateral hip stability in L stance, decreased gait speed, decreased step length and height L>R, narrow BOS, forward trunk lean, and downward head gaze. Provided verbal cues for erect posture, looking ahead, increased gait speed and arm swing, and lateral hip activation in L stance.  Neuromuscular Re-ed: Patient performed the following lower extremtiy motor control  activities: -standing mini squats x20 focused on terminal knee/hip extension to reduce L lateral thrust at the knee in weight bearing -side stepping R/L 2x10 focused on L lateral hip activation during stance and stepping, and terminal knee extension in stance -sit to/from stand without upper extremity support 2x10 focused on controlled eccentric movements for terminal knee extension and on descent into sitting  Discussed use of L knee brace, patient reports hx of brace use with poor fit leading to terminating use. Open, but skeptical to discuss alternative knee braces.   Patient reports hx of 2 falls in the past year resulting with hits to the head and bruising without LOC. Also, patient and her husband report 1.5 years of posterior LOB requiring her husband to assist her to prevent falls. Educated on pathological implications of falls, consequences of falls, and importance of improved fall prevention strategies and safety awareness with new deficits since stroke. Patient very receptive and appreciative of education. Recommend reinforcement and continued fall risk education throughout patient's stay.   Patient in recliner in the room at end of session with breaks locked, chair alarm set, and all needs within reach.   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 Restrictions Weight Bearing Restrictions: No    Therapy/Group: Individual Therapy  Apryle Stowell L Danyell Shader PT, DPT, NCS, CBIS  08/26/2022, 8:44 PM

## 2022-08-26 NOTE — Progress Notes (Signed)
Physical Therapy Session Note  Patient Details  Name: [name] MRN: [id number] Date of Birth: 07-29-1941  Today's Date: 08/26/2022 PT Individual Time: 5188-4166 PT Individual Time Calculation (min): 43 min   Short Term Goals: Week 1:  PT Short Term Goal 1 (Week 1): Pt will perform supine <>sit with CGA consistently. PT Short Term Goal 2 (Week 1): Pt will perform sit <> stand transfers with min assist and LRAD consistently. PT Short Term Goal 3 (Week 1): Pt will perform stand pivot transfers with min assist and LRAD consistently. PT Short Term Goal 4 (Week 1): Pt will amb at least 90 ft with min assist and LRAD.  Skilled Therapeutic Interventions/Progress Updates:     Pt received seated in recliner and agrees to therapy. No complaint of pain. Stand pivot transfer to [facility] with CGA and cues for sequencing and increasing eccentric control of stand to sit for safety. WC transport to gym for time management. Pt performs sit to stand with minA and cues for body mechanics. In static standing pt has slight LOB backward, requiring minA to prevent fall. Pt ambulates x300' with minA and cues for upright gaze to improve posture and balance, increasing L stride length, and increasing stance time on L. Pt verbalizes that her L knee feels like it's "crunching" and giving way, but does not verbalize any pain. Following seated rest break, pt performs repeated toe taps with R lower extremity to encourage weight shifting and loading through R hemibody. Pt completes x10 with minA and verbal and tactile cues for extensor muscle engagement, as well as weight shifting. PT then provides mirror and pt attempts again with PT providing fewer tactile cues and assistance. Pt able to perform 3 reps with CGA but then has complete LOB to the L and posteriorly, requiring maxA for safety. PT provides demonstration of lateral weight shifting while keeping hip abductors engaged for stability. Pt performs again with improved mechanics  and stability, completing x10 with CGA/minA. Pt alternates to tapping cones with L lower extremity and has x2-3 complete LOBs to the L, requiring maxA/totalA to guide pt back to WC. WC transport back to room. Pt left seated in recliner with all needs within reach.  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 Restrictions Weight Bearing Restrictions: No   Therapy/Group: Individual Therapy  Breck Coons, PT, DPT 08/26/2022, 5:05 PM

## 2022-08-26 NOTE — Patient Care Conference (Signed)
Inpatient RehabilitationTeam Conference and Plan of Care Update Date: 08/26/2022   Time: 10:38 AM    Patient Name: [name]      Medical Record Number: [id number]  Date of Birth: Nov 13, 1941 Sex: Female         Room/Bed: 4M09C/4M09C-01 Payor Info: Payor: [employer] MEDICARE / Plan: [place] HMO / Product Type: *No Product type* /    Admit Date/Time:  08/22/2022  3:31 PM  Primary Diagnosis:  Right thalamic stroke [facility])  Hospital Problems: Principal Problem:   Right thalamic stroke [facility])    Expected Discharge Date: Expected Discharge Date: 09/05/22  Team Members Present: Physician leading conference: Dr. Alger Simons Social Worker Present: Loralee Pacas, [place] Nurse Present: Dorthula Nettles, RN PT Present: Tereasa Coop, PT OT Present: Cherylynn Ridges, OT SLP Present: Weston Anna, SLP     Current Status/Progress Goal Weekly Team Focus  Bowel/Bladder   Continent of b/b; LBM:9/18  remain continent of b/b  assist with toileting needs prn   Swallow/Nutrition/ Hydration   Dys. 2 textures with thin liquids, Supervision  Mod I  trials of upgraded textures   ADL's   min A overall  Supervision  Self-care retraining, activity tolerance, transfers, L NMR, NMES   Mobility   minA bed mobility, transfers, gait x300' without AD  supervision  L hemibody NMR, balance, gait   Communication             Safety/Cognition/ Behavioral Observations  Min A  Mod I  complex problem solving and recall with use of strategies   Pain   no c/o pain  remain pain free  assess pain QS and prn   Skin   skin intact  maintain skin integrity  assess skin QS and prn     Discharge Planning:  To be assessed. Per EMR, pt will d/c to home with her husband who will provide 24/7 care. SW will confirm there are no barriers to discharge.   Team Discussion: Right CVA with left hemiparesis, dysphagia. Encourage fluids, left resting hand splint at HS. Dys 2, thin liquid diet. Continent B/B, LBM 9/18.  Reports head pain from shingles, left knee pain. Scheduled Gabapentin for head pain, Voltaren gel for knee pain. Skin CDI. Discharging home with spouse.  Patient on target to meet rehab goals: yes, supervision goals. Currently min/mod assist overall. Hand not grasping, using E-stem. Ambulating 300 ft with no assistive device. Left knee crunches and gives away. Working on high level problem solving, and memory.  *See Care Plan and progress notes for long and short-term goals.   Revisions to Treatment Plan:  MD adjusting medications.   Teaching Needs: Family education, medication/pain management, transfer/gait training, etc.   Current Barriers to Discharge: Decreased caregiver support and pain management  Possible Resolutions to Barriers: Family education Order recommended DME Outpatient or Home Health Therapy     Medical Summary Current Status: right thalamic and CR infarct, right HP. OA right knee with pain. +dysphagia  Barriers to Discharge: Medical stability   Possible Resolutions to [employer] Focus: daily assesment of labs and pt data, pain mgt, ?left knee brace   Continued Need for Acute Rehabilitation Level of Care: The patient requires daily medical management by a physician with specialized training in physical medicine and rehabilitation for the following reasons: Direction of a multidisciplinary physical rehabilitation program to maximize functional independence : Yes Medical management of patient stability for increased activity during participation in an intensive rehabilitation regime.: Yes Analysis of laboratory values and/or radiology reports  with any subsequent need for medication adjustment and/or medical intervention. : Yes   I attest that I was present, lead the team conference, and concur with the assessment and plan of the team.   Cristi Loron 08/26/2022, 1:38 PM

## 2022-08-26 NOTE — Progress Notes (Signed)
Patient ID: [name], female   DOB: 14-Apr-1941, 81 y.o.   MRN: [id number]  SW met with pt and pt husband in room to provide updates from team conference, and d/c date 9/29. SW informed there will continue to be updates.   Loralee Pacas, MSW, [place] Office: [phone] Cell: [phone] Fax: [phone]

## 2022-08-26 NOTE — Progress Notes (Signed)
Inpatient Rehabilitation Care Coordinator Assessment and Plan Patient Details  Name: [name] MRN: [id number] Date of Birth: July 31, 1941  Today's Date: 08/26/2022  Hospital Problems: Principal Problem:   Right thalamic stroke [facility])  Past Medical History:  Past Medical History:  Diagnosis Date   Anemia    Arthritis    Asthma    uses inhaler just prior to surgery to avoid attack   Back pain    from previous injury   Complication of anesthesia    has woken  up during 2 different surgery   Depression    no current issue/treatment; situation   Gallstones    GERD (gastroesophageal reflux disease)    Hiatal hernia    patient does NOT have nerve/muscle disease   History of kidney stones    HLD (hyperlipidemia)    HTN (hypertension)    Hypothyroidism    Kidney stones    Knee pain    Non-diabetic pancreatic hormone dysfunction years   pt. 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   Pancreatitis    Pneumonia    Seizures ([place])    caused by dye injected during a procedure   Shortness of breath    with exertion   Sinus problem    frequent infections/congestion   Stroke ([place]) 2021   reports having CVA in 2021 and having mini strokes before that   Thyroid disease    Past Surgical History:  Past Surgical History:  Procedure Laterality Date   ABDOMINAL HYSTERECTOMY     APPENDECTOMY     CARPAL TUNNEL RELEASE  10+ years ago   bilateral   EYE SURGERY  3 yrs ago   bilateral cataracts   FOOT OSTEOTOMY  6 weeks ago   Left foot: great, 2nd & 3rd   FOOT OSTEOTOMY  5 years ago   Right great toe   HAND SURGERY Bilateral 2011-most recent   multiple hand surgeries, 2 on left, 3 on right   KNEE ARTHROPLASTY Right 04/28/2022   Procedure: COMPUTER ASSISTED TOTAL KNEE ARTHROPLASTY;  Surgeon: Dereck Leep, MD;  Location: [facility] ORS;  Service: Orthopedics;  Laterality: Right;   LOOP RECORDER INSERTION N/A 05/16/2020   Procedure: LOOP RECORDER INSERTION;  Surgeon: Isaias Cowman,  MD;  Location: [facility];  Service: Cardiovascular;  Laterality: N/A;   NASAL SINUS SURGERY  most recent 7-8 yrs ago   7 sinus surgeries    TRIGGER FINGER RELEASE  11/19/2011   Procedure: RELEASE TRIGGER FINGER/A-1 PULLEY;  Surgeon: Wynonia Sours, MD;  Location: [facility];  Service: Orthopedics;  Laterality: Right;  release a-1 pulley right index finger and cyst removal   WRIST GANGLION EXCISION  1980's   right   Social History:  reports that she has never smoked. She has never used smokeless tobacco. She reports that she does not currently use alcohol. She reports that she does not use drugs.  Family / Support Systems Marital Status: Married How Long?: 40years Patient Roles: Spouse, Parent Spouse/Significant Other: [name] (husband) Children: One son;  lives in [place]. Other Supports: None reported Anticipated Caregiver: Husband Ability/Limitations of Caregiver: Husband will provide 24/7 care Caregiver Availability: 24/7 Family Dynamics: Pt lives with husband.  Social History Preferred language: English Religion: Baptist Cultural Background: Pt worked for an Web designer for 11  years. Education: [place] - How often do you need to have someone help you when you read instructions, pamphlets, or other written material from your doctor or pharmacy?: Never  Writes: Yes Employment Status: Retired Date Retired/Disabled/Unemployed: Water quality scientist Issues: Denies Guardian/Conservator: N/A   Abuse/Neglect Abuse/Neglect Assessment Can Be Completed: Yes Physical Abuse: Denies Verbal Abuse: Denies Sexual Abuse: Denies Exploitation of patient/patient's resources: Denies Self-Neglect: Denies  Patient response to: Social Isolation - How often do you feel lonely or isolated from those around you?: Never  Emotional Status Pt's affect, behavior and adjustment status: Pot in good spirits at time of visit. Pt  adjusting to her limited mobility and is looking forward to being more independent. Recent Psychosocial Issues: Denies Psychiatric History: Denies Substance Abuse History: Denies  Patient / Family Perceptions, Expectations & Goals Pt/Family understanding of illness & functional limitations: Pt and husband have a general understanding of pt care needs Premorbid pt/family roles/activities: Independent Anticipated changes in roles/activities/participation: Assistance with ADLs.IADLs Pt/family expectations/goals: Pt goal is to "work on getting R side working back and get rid of shoulder pain. Just where I can be strong again.Marland Kitchengetting back to how I was before."  [employer]: None Premorbid Home Care/DME Agencies: None Transportation available at discharge: Husband Is the patient able to respond to transportation needs?: Yes In the past 12 months, has lack of transportation kept you from medical appointments or from getting medications?: No In the past 12 months, has lack of transportation kept you from meetings, work, or from getting things needed for daily living?: No Resource referrals recommended: Neuropsychology  Discharge Planning Living Arrangements: Spouse/significant other Support Systems: Spouse/significant other Type of Residence: Private residence Insurance underwriter Resources: Multimedia programmer (specify) ([employer]) Financial Resources: Radio broadcast assistant Screen Referred: No Living Expenses: Own Money Management: Patient, Spouse Does the patient have any problems obtaining your medications?: No Home Management: Both help manage homecare needs Patient/Family Preliminary Plans: Husband Care Coordinator Barriers to Discharge: Decreased caregiver support, Lack of/limited family support Care Coordinator Anticipated Follow Up Needs: HH/OP Expected length of stay: D/c date 9/29  Clinical Impression SW met with pt and pt husband in room to introduce  self, explain role, and discuss discharge process. Pt is not a English as a second language teacher. Has trust that has living will included. DME: RW, cane, and 3in1 BSC.   [name] 08/26/2022, 3:47 PM

## 2022-08-26 NOTE — Progress Notes (Signed)
Speech Language Pathology Daily Session Note  Patient Details  Name: [name] MRN: [id number] Date of Birth: September 02, 1941  Today's Date: 08/26/2022 SLP Individual Time: 1100-1145 SLP Individual Time Calculation (min): 45 min  Short Term Goals: Week 1: SLP Short Term Goal 1 (Week 1): Pt will consume regular textures over x2 trials with limits impairments such as bitting of inner buccal lining and no overt s/s aspiration, pior to upgrade of solids. SLP Short Term Goal 2 (Week 1): Pt will demonstrate recall of daily and complex infromation utilzing internal and external strategies with supervision A verbal cues. SLP Short Term Goal 3 (Week 1): Pt will complete complex problem solving tasks (medication/money/time management) with supervision A verbal cues.  Skilled Therapeutic Interventions: Pt seen for skilled ST with focus on cognitive goals, pt upright in recliner with husband present throughout. Pt able to recall events of morning and previous ST session with Supervision A level question cues. SLP facilitating completion of complex money management task by providing overall Supervision A verbal cues for 100% accuracy. Pt able to detail compensatory strategies at home including using husband to "double check" checkbook balancing and ability to accurately follow recipes during cooking tasks at home. Pt provided suggestions for adaptive equipment to utilize at home to increase participation and independence with hobbies/interests (playing cards, cooking, gardening). Pt left in recliner with husband and SW present, cont ST POC.  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

## 2022-08-26 NOTE — Progress Notes (Signed)
PROGRESS NOTE   Subjective/Complaints:  Pt up in chair. Fairly good spirits. Husband at bedside. Notes instability and cracking in left knee with associated pain. Was scheduled for TKA this week in fact. 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  ROS: Patient denies fever, rash, sore throat, blurred vision, dizziness, nausea, vomiting, diarrhea, cough, shortness of breath or chest pain,  back/neck pain, headache, or mood change.  Objective:   No results found. No results for input(s): "WBC", "HGB", "HCT", "PLT" in the last 72 hours. Recent Labs    08/25/22 0526  NA 139  K 3.6  CL 104  CO2 24  GLUCOSE 84  BUN 13  CREATININE 0.88  CALCIUM 9.4    Intake/Output Summary (Last 24 hours) at 08/26/2022 1137 Last data filed at 08/25/2022 1344 Gross per 24 hour  Intake 177 ml  Output --  Net 177 ml        Physical Exam:  Constitutional: No distress . Vital signs reviewed. HEENT: NCAT, EOMI, oral membranes moist Neck: supple Cardiovascular: RRR without murmur. No JVD    Respiratory/Chest: CTA Bilaterally without wheezes or rales. Normal effort    GI/Abdomen: BS +, non-tender, non-distended Ext: no clubbing, cyanosis, or edema Psych: pleasant and cooperative  Skin: Skin is warm and dry.     Comments: Paper thin with multiple ecchymotic areas LUE>RUE.   MSK: Left knee with crepitus during ROM and chronic changes visible at rest      No apparent deformity.      Strength:                LUE: 3/5 SA, 2/5 EF, 2/5 EE, 1/5 WE, 1/5 FF, tr/5 FA                 RUE: 5/5 SA, 5/5 EF, 5/5 EE, 5/5 WE, 5/5 FF, 5/5 FA                 RLE: 5/5 HF, 5/5 KE, 5/5 DF, 5/5 EHL, 5/5 PF                 LLE:  5/5 HF, 5/5 KE, 5/5 DF, 5/5 EHL, 5/5 PF    Neurologic exam:  Cognition: orient to person and place, date. Reasonable insight and awareness.s Language: Fluent, No substitutions or neoglisms. Mild dysarthria.  Sensation: To light touch intact in BL UEs and LEs   Reflexes: 2+ in BL UE and LEs. Negative Hoffman's and babinski signs bilaterally.  CN: + L facial droop remains Coordination: No apparent tremors. Spasticity: MAS 0 in all extremities.        Assessment/Plan: 1. Functional deficits which require 3+ hours per day of interdisciplinary therapy in a comprehensive inpatient rehab setting. Physiatrist is providing close team supervision and 24 hour management of active medical problems listed below. Physiatrist and rehab team continue to assess barriers to discharge/monitor patient progress toward functional and medical goals  Care Tool:  Bathing    Body parts bathed by patient: Right arm, Chest, Abdomen, Front perineal area, Right upper leg, Left upper leg, Face   Body parts bathed by helper: Left arm, Buttocks, Right lower leg, Left lower leg  Bathing assist Assist Level: Moderate Assistance - Patient 50 - 74%     Upper Body Dressing/Undressing Upper body dressing   What is the patient wearing?: Pull over shirt    Upper body assist Assist Level: Maximal Assistance - Patient 25 - 49%    Lower Body Dressing/Undressing Lower body dressing      What is the patient wearing?: Pants, Underwear/pull up     Lower body assist Assist for lower body dressing: Maximal Assistance - Patient 25 - 49%     Toileting Toileting    Toileting assist Assist for toileting: Maximal Assistance - Patient 25 - 49%     Transfers Chair/bed transfer  Transfers assist     Chair/bed transfer assist level: Moderate Assistance - Patient 50 - 74%     Locomotion Ambulation   Ambulation assist      Assist level: 2 helpers (mod assist of 1 and +2 w/c follow) Assistive device: Other (comment) (wall hand rail on right side progressing to bilateral HHA) Max distance: 38f no turning   Walk 10 feet activity   Assist     Assist level: 2 helpers (mod assist of 1 and +2 w/c follow) Assistive device: Other (comment) (wall hand rail on  right side progressing to bilateral HHA)   Walk 50 feet activity   Assist Walk 50 feet with 2 turns activity did not occur: Safety/medical concerns         Walk 150 feet activity   Assist Walk 150 feet activity did not occur: Safety/medical concerns         Walk 10 feet on uneven surface  activity   Assist Walk 10 feet on uneven surfaces activity did not occur: Safety/medical concerns         Wheelchair     Assist Is the patient using a wheelchair?: Yes (Transport to and from therapy gym for energy conservation and time management.) Type of Wheelchair: Manual    Wheelchair assist level: Dependent - Patient 0% Max wheelchair distance: 150    Wheelchair 50 feet with 2 turns activity    Assist        Assist Level: Dependent - Patient 0%   Wheelchair 150 feet activity     Assist      Assist Level: Dependent - Patient 0%   Blood pressure (!) 150/65, pulse 71, temperature 97.7 F (36.5 C), temperature source Oral, resp. rate 18, height 4' 11.5" (1.511 m), weight 51.4 kg, SpO2 99 %.  Medical Problem List and Plan: 1. 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 Interdisciplinary team conference today to discuss goals, barriers to discharge, and dc planning.   2.  Antithrombotics: -DVT/anticoagulation:  Pharmaceutical: Lovenox             -antiplatelet therapy: DAPT X 3 weeks followed by ASA alone.  3. Post herpetic neuralgia/Pain Management: Was on gabapentin for left facial and scalp pain.  --resumed gabapentin. -add voltaren gel for left knee. Might benefit from knee brace 4. Mood/Behavior/Sleep: LCSW to follow for evaluation and support.               -  antipsychotic agents: N/A 5. Neuropsych/cognition: This patient is capable of making decisions on her own behalf. 6. Skin/Wound Care: Routine pressure relief measures.  7. Fluids/Electrolytes/Nutrition: Monitor I/O. Check CMET in am.              - 9/18 - Mild AKI. Encouraging PO fluids, resolved     Latest Ref Rng & Units 08/25/2022    5:26 AM 08/23/2022    6:12 AM 08/21/2022    7:09 AM  BMP  Glucose 70 - 99 mg/dL 84  79  94   BUN 8 - 23 mg/dL '13  17  7   '$ Creatinine 0.44 - 1.00 mg/dL 0.88  1.01  0.73   Sodium 135 - 145 mmol/L 139  137  139   Potassium 3.5 - 5.1 mmol/L 3.6  3.8  3.3   Chloride 98 - 111 mmol/L 104  102  108   CO2 22 - 32 mmol/L '24  24  21   '$ Calcium 8.9 - 10.3 mg/dL 9.4  9.2  8.8     8. HTN: Monitor BP TID. Continue inderal and Cozaar.             - Per Neurology notes, permissive up to 220/110, target <200 and gradually decrease over 5-7 days (target date for normal 9/19)            - Hydralazine PRN 10 mg for SBP >220           - 9/16 - significant BP downtrend in last 24 hours; monitor           - 9/17 - remains elevated but sustained <180 SBP; adding Norvasc 5 mg daily. Will take 2-3 days for effect, but cautious to increase Losartan given AKI.     08/26/2022    8:50 AM 08/26/2022    4:03 AM 08/25/2022    7:01 PM  Vitals with BMI  Systolic 892 119 417  Diastolic 65 58 76  Pulse 71 65 74   9/19 Amlodipine started 9/17 monitor effect today. Seems to be trending down  9. H/o occasional Headaches: Used ASA at home prn. . --continue Inderal. Resumed tramadol prn.  10. H/o anxiety d/o: Continue Cymbalta.  11. Hypokalemia: Likely dilutional from IVF--will supplement today X 1  --Normokalemic last 2 readings   12. Leucocytosis: Resolved   13. 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 H/o Asthma: Resume Singulair. Albuterol MDI prn as rescue.    15. Dysphagia: Multifactorial due to facial  weakness, numbness? difficulty breathing due to sinus surgeries etc             --pt and husband would like to try D2 diet.              -SLP to follow up. Advance as possible 16. Constipation            - Small BM 9/15, 9/16, 9/17            - 9/16 - add Senna S 2 tabs QHS + Miralax BID         LOS: 4 days A FACE TO FACE EVALUATION WAS PERFORMED  Meredith Staggers 08/26/2022, 11:37 AM

## 2022-08-26 NOTE — Progress Notes (Signed)
Occupational Therapy Session Note  Patient Details  Name: [name] MRN: [id number] Date of Birth: [birth year]  Today's Date: 08/26/2022 OT Individual Time: 0940-7680 OT Individual Time Calculation (min): 55 min    Short Term Goals: Week 1:  OT Short Term Goal 1 (Week 1): Pt will complete toilet transfers wiht LRAD and MIN A OT Short Term Goal 2 (Week 1): Pt will don shirt wiht MOD A OT Short Term Goal 3 (Week 1): Pt will recall hemi strategies wiht no more than min cuing OT Short Term Goal 4 (Week 1): Pt will thread BLE into pants with AE PRN  Skilled Therapeutic Interventions/Progress Updates:    Pt greeted semi-reclined in bed with spouse present and agreeable to OT treatment session. Addressed self-care retraining with education for hemi-dressing techniques while seated EOB. Pt easily distracted and has difficulty with carryover of novel tasks at times, but able to be re-directed. Pt needed min A for balance at EOB when coming into figure 4 position for LB dressing strategies. Pt also impulsive to stand with occasional L knee buckle requiring cues for safety. Pt brought to therapy gym in wc for time management. L UE NMR with weight bearing towel pushes in standing going uphill on wedge. Pt with good shoulder activation with repetition. L UE NMR with OT providing joint input through wrist and elbow to bring pt through shoulder, elbow, wrist, ROM. Strong activation with joint input. Pt able to activate flexor synergy pattern to grasp but only within flexor pattern. Pt ambulated back to room with Min HHA and occasional mod A for LOB and scissoring. Pt left seated in recliner with chair alarm pad. OT placed SAEBO e-stim placed on wrist extensors. SAEBO left on for 60 minutes. OT returned to remove SAEBO with skin intact and no adverse reactions.  [place]o Stim One 330 pulse width 35 Hz pulse rate On 8 sec/ off 8 sec Ramp up/ down 2 sec Symmetrical Biphasic wave form  Max intensity 174m at 500  Ohm load   Therapy Documentation Precautions:  Precautions Precautions: Fall Precaution Comments: L hemi (UE>LE), L lean, impulsive. Restrictions Weight Bearing Restrictions: No Pain: Pain Assessment Pain Scale: 0-10 Pain Score: 0-No pain   Therapy/Group: Individual Therapy  [name]9/19/2023, 8:48 AM

## 2022-08-27 DIAGNOSIS — I10 Essential (primary) hypertension: Secondary | ICD-10-CM | POA: Diagnosis not present

## 2022-08-27 DIAGNOSIS — I69391 Dysphagia following cerebral infarction: Secondary | ICD-10-CM | POA: Diagnosis not present

## 2022-08-27 DIAGNOSIS — I6381 Other cerebral infarction due to occlusion or stenosis of small artery: Secondary | ICD-10-CM | POA: Diagnosis not present

## 2022-08-27 DIAGNOSIS — M1712 Unilateral primary osteoarthritis, left knee: Secondary | ICD-10-CM | POA: Diagnosis not present

## 2022-08-27 NOTE — Progress Notes (Signed)
Physical Therapy Session Note  Patient Details  Name: [name] MRN: [id number] Date of Birth: 1941/11/27  Today's Date: 08/27/2022 PT Individual Time: 5374-8270 PT Individual Time Calculation (min): 27 min   Short Term Goals: Week 1:  PT Short Term Goal 1 (Week 1): Pt will perform supine <>sit with CGA consistently. PT Short Term Goal 2 (Week 1): Pt will perform sit <> stand transfers with min assist and LRAD consistently. PT Short Term Goal 3 (Week 1): Pt will perform stand pivot transfers with min assist and LRAD consistently. PT Short Term Goal 4 (Week 1): Pt will amb at least 90 ft with min assist and LRAD.  Skilled Therapeutic Interventions/Progress Updates:     Patient in bed with LPN providing morning medications and husband at bedside upon PT arrival. Patient alert and agreeable to PT session. Patient denied pain during session.  Noted patient with recorded elevated BP this morning. Assessed vitals prior to mobility in supine BP 179/65, HR 69. Reassessed after 5 min of quiet rest BP 168/69, HR 62. Provided education on maintaining a daily BP log to take to medical appointments, signs/symptoms of elevated BP, and MD orders for current BP parameters per the chart during rest break. Also cued patient on diaphragmatic breathing and guided imagery to promote relaxation.  Therapeutic Activity: Bed Mobility: Patient performed supine to sit with supervision and increased time effort to sit up on the L side of the bed. Provided verbal cues for set-up and use of L arm for trunk control to come to sitting. Transfers: Patient performed stand pivot bed>w/c with CGA. Provided verbal cues for L foot placement to reduce scissoring with step.  Neuromuscular Re-ed: Focused on slow warm up to mobility with sitting balance focused on postural control with dynamic balance and L hemi-body motor control while doffing/donning shirt and donning pants socks and shoes sitting EOB >15 min with supervision  and mod-max A for L upper extremity grasp, pinch, and reach. Min A provided for donning long sleeve shirt after several attempts to thread her L upper without using compensatory strategies.   Patient in w/c set-up at the sink to brush her teeth with her husband in the room at end of session with breaks locked and all needs within reach.   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 Restrictions Weight Bearing Restrictions: No   Therapy/Group: Individual Therapy  [name] L [name] PT, DPT, NCS, CBIS  08/27/2022, 6:09 PM

## 2022-08-27 NOTE — Progress Notes (Signed)
Occupational Therapy Session Note  Patient Details  Name: [name] MRN: [id number] Date of Birth: [birth year]  Today's Date: 08/27/2022 OT Individual Time: 2831-5176 OT Individual Time Calculation (min): 63 min    Short Term Goals: Week 1:  OT Short Term Goal 1 (Week 1): Pt will complete toilet transfers wiht LRAD and MIN A OT Short Term Goal 2 (Week 1): Pt will don shirt wiht MOD A OT Short Term Goal 3 (Week 1): Pt will recall hemi strategies wiht no more than min cuing OT Short Term Goal 4 (Week 1): Pt will thread BLE into pants with AE PRN  Skilled Therapeutic Interventions/Progress Updates:  Pt awake seated in recliner with spouse present upon OT arrival to the room. Pt reports, "This hand just gets frustrating." Pt in agreement for OT session.   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 Restrictions Weight Bearing Restrictions: No Vital Signs: Please see "Flowsheet" for most recent vitals charted by nursing staff.  Pain: Pain Assessment Pain Scale: 0-10 Pain Score: 0-No pain  ADL: Pt declines need to perform ADLs at this time as pt just completing toileting prior to OT session. OT provides education to pt and spouse on recommendation for pt to sleep with L side toward the middle of the bed to decrease risks of fall out of bed d/t pt's decreased proprioception and awareness on the L side. Pt and spouse report plan of already discussing this and plan to switch sides for this to happen at DC.   LUE Neuro Re-Education: Pt participates in various tasks to to focus on LUE neuro re-education and motor control. Pt able to participate in WB through elbow while seated at edge of mat for 1 minute trials x 3 with close supervision with pt able to maintain proper positioning and with an emphasis on performing tricep push-up using LUE to transition from WB position <> sitting with close supervision. Pt  demo's good tricep activation to assist with this transition. Rest breaks provided between trials and tasks with OT providing education on importance of performing WB tasks to improve proximal strength needed for improved AROM. Pt verbalizes understanding. Pt then able to transition from sitting at edge of meat <> prone with close supervision for safety. Pt then able to perform prone on elbows x 1 minute prior to needing a rest break due to discomfort on B elbows. OT then provides pt with B elbow pads and pt able to maintain prone on elbow position x 6 minutes with minimal VC's to lift head, however, pt able to maintain proper position without physical assist. OT provides education to pt's spouse as well of WB techniques and prone on elbows with safety precautions (bed rails up, spouse standing close by, etc.). Spouse verbalizes understanding.   Oral Motor Control: OT provides education and tasks that pt can perform to improve oral and extraoral control which will improve mouth closure with eating. OT provides education to use a straw to keep lip closure to blow air through by blowing air to move objects across table and having spouse make faces and having pt re-create them. Pt and spouse verbalizes understanding.   Functional Mobility: Pt able to perform varius sit <> stands from edge of mat and recliner with close supervision on L side. Pt able to perform functional ambulation from room <> therapy gym (~150' x 2) with minimal assistance with support on L side and OT supporting LUE. When pt diverts attention  form walking or looks down pt noted to have L lateral leaning and requiring minimal assistance to correct. Pt demo's increased safety and balance when pt is not focusing on ambulating as much and participates in social conversation during ambulation.   Pt requested to stay in the recliner at end of session. Pt left sitting comfortably in the recliner with personal belongings and call light within reach,  spouse present in room, and comfort needs attended to.    Therapy/Group: Individual Therapy  Barbee Shropshire 08/27/2022, 12:39 PM

## 2022-08-27 NOTE — Progress Notes (Signed)
Physical Therapy Session Note  Patient Details  Name: [name] MRN: [id number] Date of Birth: 07-15-1941  Today's Date: 08/27/2022 PT Individual Time: 1405-1506 PT Individual Time Calculation (min): 61 min   Short Term Goals: Week 1:  PT Short Term Goal 1 (Week 1): Pt will perform supine <>sit with CGA consistently. PT Short Term Goal 2 (Week 1): Pt will perform sit <> stand transfers with min assist and LRAD consistently. PT Short Term Goal 3 (Week 1): Pt will perform stand pivot transfers with min assist and LRAD consistently. PT Short Term Goal 4 (Week 1): Pt will amb at least 90 ft with min assist and LRAD.  Skilled Therapeutic Interventions/Progress Updates:  Patient seated in recliner on entrance to room. Husband present. Patient alert and agreeable to PT session.   Patient with no pain complaint at start of session.  Therapeutic Activity: Transfers: Pt performed sit<>stand and stand pivot transfers throughout session with impulsivity, attempts for fast movements to complete. Requires CGA to complete but max cues for safety and to reduce impulsive initiation. Initial stand pivot to w/c initiated prior to brakes applied. Education provided for safe brake application, visual demonstration and then need for reach with RUE to armrest in order to ensure w/c not moving, and use UE to control descent.   Gait Training:  Pt ambulated 1' x1/ 40' x1  in day room using no AD with up to [facility] for balance. With vc for slowing pace and focus on L LE advancement to improve balance and decrease L hip instability. Provided consistent vc/ tc for focus/ balance and LLE activation.  Neuromuscular Re-ed: NMR facilitated during session with focus on standing balance. Pt focus initially on toe touches to 2" then 4" step with focus on lateral weight shift, adequate hip motor control to place L foot gently on step and then to raise from step to bring foot back to floor. Switch to stand on LLE to improve L  knee extension activation for performance on RLE.   Then pt guided in stance with target placed at shoulder height to each side and additional target at waist height to pt's R side. Pt guided in static stance and rotational reach to higher targets on each side using RUE with good weight shift and good trunk rotation. Lower target to r side with focus on activation of LUE with AAROM from PT for pt to initiate motion to target and PT assisting to complete.   NMR performed for improvements in motor control and coordination, balance, sequencing, judgement, and self confidence/ efficacy in performing all aspects of mobility at highest level of independence.   Patient seated in recliner at end of session with brakes locked, no alarm set as no alarm setup in room and pt's husband present, and all needs within reach. Located green box and provided to RN to apply.    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 Restrictions Weight Bearing Restrictions: No General:   Vital Signs:   Pain:  No pain complaint from pt this session. Fatigue related at end of session.   Therapy/Group: Individual Therapy  Alger Simons PT, DPT, CSRS 08/27/2022, 7:13 PM

## 2022-08-27 NOTE — Progress Notes (Signed)
Speech Language Pathology Daily Session Note  Patient Details  Name: [name] MRN: [id number] Date of Birth: 1941/12/03  Today's Date: 08/27/2022 SLP Individual Time: 0928-1028 SLP Individual Time Calculation (min): 60 min  Short Term Goals: Week 1: SLP Short Term Goal 1 (Week 1): Pt will consume regular textures over x2 trials with limits impairments such as bitting of inner buccal lining and no overt s/s aspiration, pior to upgrade of solids. SLP Short Term Goal 2 (Week 1): Pt will demonstrate recall of daily and complex infromation utilzing internal and external strategies with supervision A verbal cues. SLP Short Term Goal 3 (Week 1): Pt will complete complex problem solving tasks (medication/money/time management) with supervision A verbal cues.  Skilled Therapeutic Interventions: Skilled treatment session focused on dysphagia and cognitive goals. SLP facilitated session by providing skilled observation with trials of Dys. 3 textures. Patient demonstrated efficient mastication with complete oral clearance without overt s/s of aspiration. Patient also reports improved oral-motor strength and comfort with PO intake. Therefore, recommend patient upgrade to Dys. 3 textures. SLP also facilitated session by providing Min A verbal cues for recall of her current medications and their function. SLP provided strategies to utilize upon discharge to maximize overall function while organizing a TID pill box. Patient verbalized understanding and will utilize strategies with task during next session. [name] which can impact her ability to complete tasks in a timely manner. Patient left upright in wheelchair with husband present. Continue with current plan of care.      Pain Pain Assessment Pain Scale: 0-10 Pain Score: 0-No pain  Therapy/Group: Individual Therapy  [name] 08/27/2022, 1:08 PM

## 2022-08-27 NOTE — Progress Notes (Signed)
PROGRESS NOTE   Subjective/Complaints:  Was up a couple times overnight because she had to void. Drank a good bit yesterday PM. Voltaren gel seems to have helped her left knee  Objective:   No results found. No results for input(s): "WBC", "HGB", "HCT", "PLT" in the last 72 hours. Recent Labs    08/25/22 0526  NA 139  K 3.6  CL 104  CO2 24  GLUCOSE 84  BUN 13  CREATININE 0.88  CALCIUM 9.4    Intake/Output Summary (Last 24 hours) at 08/27/2022 8786 Last data filed at 08/26/2022 1356 Gross per 24 hour  Intake 295 ml  Output --  Net 295 ml        Physical Exam:  Constitutional: No distress . Vital signs reviewed. HEENT: NCAT, EOMI, oral membranes moist Neck: supple Cardiovascular: RRR without murmur. No JVD    Respiratory/Chest: CTA Bilaterally without wheezes or rales. Normal effort    GI/Abdomen: BS +, non-tender, non-distended Ext: no clubbing, cyanosis, or edema Psych: pleasant and cooperative  Skin: Skin is warm and dry.     Comments: Paper thin with multiple ecchymotic areas LUE>RUE.   MSK: Left knee with crepitus during ROM and chronic changes visible       No apparent deformity.      Strength:                LUE: 3/5 SA, 2/5 EF, 2/5 EE, 1/5 WE, 1/5 FF, tr/5 FA                 RUE: 5/5 SA, 5/5 EF, 5/5 EE, 5/5 WE, 5/5 FF, 5/5 FA                 RLE: 5/5 HF, 5/5 KE, 5/5 DF, 5/5 EHL, 5/5 PF                 LLE:  5/5 HF, 5/5 KE, 5/5 DF, 5/5 EHL, 5/5 PF    Neurologic exam: a little slow to arouse inititally but rebounded Cognition: orient to person and place, date. Reasonable insight and awareness.s Language: Fluent, No substitutions or neoglisms. Mild dysarthria.  Sensation: To light touch intact in BL UEs and LEs  Reflexes: 2+ in BL UE and LEs. Negative Hoffman's and babinski signs bilaterally.  CN: + L facial droop present Coordination: No apparent tremors. Spasticity: MAS 0 in all extremities.         Assessment/Plan: 1. Functional deficits which require 3+ hours per day of interdisciplinary therapy in a comprehensive inpatient rehab setting. Physiatrist is providing close team supervision and 24 hour management of active medical problems listed below. Physiatrist and rehab team continue to assess barriers to discharge/monitor patient progress toward functional and medical goals  Care Tool:  Bathing    Body parts bathed by patient: Right arm, Chest, Abdomen, Front perineal area, Right upper leg, Left upper leg, Face   Body parts bathed by helper: Left arm, Buttocks, Right lower leg, Left lower leg     Bathing assist Assist Level: Moderate Assistance - Patient 50 - 74%     Upper Body Dressing/Undressing Upper body dressing   What is the patient wearing?: Pull  over shirt    Upper body assist Assist Level: Maximal Assistance - Patient 25 - 49%    Lower Body Dressing/Undressing Lower body dressing      What is the patient wearing?: Pants, Underwear/pull up     Lower body assist Assist for lower body dressing: Maximal Assistance - Patient 25 - 49%     Toileting Toileting    Toileting assist Assist for toileting: Maximal Assistance - Patient 25 - 49%     Transfers Chair/bed transfer  Transfers assist     Chair/bed transfer assist level: Moderate Assistance - Patient 50 - 74%     Locomotion Ambulation   Ambulation assist      Assist level: 2 helpers (mod assist of 1 and +2 w/c follow) Assistive device: Other (comment) (wall hand rail on right side progressing to bilateral HHA) Max distance: 35f no turning   Walk 10 feet activity   Assist     Assist level: 2 helpers (mod assist of 1 and +2 w/c follow) Assistive device: Other (comment) (wall hand rail on right side progressing to bilateral HHA)   Walk 50 feet activity   Assist Walk 50 feet with 2 turns activity did not occur: Safety/medical concerns         Walk 150 feet  activity   Assist Walk 150 feet activity did not occur: Safety/medical concerns         Walk 10 feet on uneven surface  activity   Assist Walk 10 feet on uneven surfaces activity did not occur: Safety/medical concerns         Wheelchair     Assist Is the patient using a wheelchair?: Yes (Transport to and from therapy gym for energy conservation and time management.) Type of Wheelchair: Manual    Wheelchair assist level: Dependent - Patient 0% Max wheelchair distance: 150    Wheelchair 50 feet with 2 turns activity    Assist        Assist Level: Dependent - Patient 0%   Wheelchair 150 feet activity     Assist      Assist Level: Dependent - Patient 0%   Blood pressure (!) 170/64, pulse 60, temperature 97.7 F (36.5 C), temperature source Oral, resp. rate 16, height 4' 11.5" (1.511 m), weight 51.4 kg, SpO2 100 %.  Medical Problem List and Plan: 1. 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  Antithrombotics: -DVT/anticoagulation:  Pharmaceutical: Lovenox             -antiplatelet therapy: DAPT X 3 weeks followed by ASA alone.  3. Post herpetic neuralgia/Pain Management: Was on gabapentin for left facial and scalp pain.  --resumed gabapentin. - voltaren gel helpful for left knee. Might benefit from knee brace 4. Mood/Behavior/Sleep: LCSW to follow for evaluation and support.              -antipsychotic agents: N/A 5. Neuropsych/cognition: This patient is capable of making decisions on her own behalf. 6. Skin/Wound Care: Routine pressure relief measures.  7. Fluids/Electrolytes/Nutrition: Monitor I/O.  Check CMET in am.              - 9/18 - Mild AKI.  Encouraging PO fluids, resolved     Latest Ref Rng & Units 08/25/2022    5:26 AM 08/23/2022    6:12 AM 08/21/2022    7:09 AM  BMP  Glucose 70 - 99 mg/dL 84  79  94   BUN 8 - 23 mg/dL '13  17  7   '$ Creatinine 0.44 - 1.00 mg/dL 0.88  1.01  0.73   Sodium 135 - 145 mmol/L 139  137  139   Potassium 3.5 - 5.1 mmol/L 3.6  3.8  3.3   Chloride 98 - 111 mmol/L 104  102  108   CO2 22 - 32 mmol/L '24  24  21   '$ Calcium 8.9 - 10.3 mg/dL 9.4  9.2  8.8     -encouraged more drinking in AM to avoid HS voiding 8. HTN: Monitor BP TID. Continue inderal and Cozaar.             - Per Neurology notes, permissive up to 220/110, target <200 and gradually decrease over 5-7 days (target date for normal 9/19)            - Hydralazine PRN 10 mg for SBP >220           - 9/16 - significant BP downtrend in last 24 hours; monitor           - 9/17 - remains elevated but sustained <180 SBP; adding Norvasc 5 mg daily. Will take 2-3 days for effect, but cautious to increase Losartan given AKI.     08/27/2022    4:55 AM 08/26/2022    7:55 PM 08/26/2022    3:42 PM  Vitals with BMI  Systolic [phone]  Diastolic 64 62 69  Pulse 60 71 77    Amlodipine started 9/17 monitor effect today. Seems to be trending down   9/20 persistent elevation--increase norvasc to '10mg'$  9. H/o occasional Headaches: Used ASA at home prn. . --continue Inderal. Resumed tramadol prn.  10. H/o anxiety d/o: Continue Cymbalta.  11. Hypokalemia: Likely dilutional from IVF--will supplement today X 1  --Normokalemic last 2 readings   12. Leucocytosis: Resolved   13. 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 H/o Asthma: Resume Singulair. Albuterol MDI prn as rescue.    15. Dysphagia: Multifactorial due to facial weakness, numbness? difficulty breathing due to sinus surgeries etc             --pt and husband would like to try D2 diet.              -SLP to follow up. Advance as  possible 16. Constipation            - Small BM 9/15, 9/16, 9/17            - 9/16 - add Senna S 2 tabs QHS + Miralax BID      -last BM 9/17    LOS: 5 days A FACE TO Avis 08/27/2022, 9:03 AM

## 2022-08-28 NOTE — Progress Notes (Signed)
Speech Language Pathology Daily Session Note  Patient Details  Name: FRONIE HOLSTEIN MRN: [id number] Date of Birth: 15-Jul-1941  Today's Date: 08/28/2022 SLP Individual Time: 1405-1503 SLP Individual Time Calculation (min): 58 min  Short Term Goals: Week 1: SLP Short Term Goal 1 (Week 1): Pt will consume regular textures over x2 trials with limits impairments such as bitting of inner buccal lining and no overt s/s aspiration, pior to upgrade of solids. SLP Short Term Goal 2 (Week 1): Pt will demonstrate recall of daily and complex infromation utilzing internal and external strategies with supervision A verbal cues. SLP Short Term Goal 3 (Week 1): Pt will complete complex problem solving tasks (medication/money/time management) with supervision A verbal cues.  Skilled Therapeutic Interventions: Skilled treatment session focused on cognitive goals. SLP facilitated session by providing overall supervision level verbal cues for problem solving and error awareness during a complex medication task in which she organized a TID pill box. SLP also provided education regarding the importance of utilizing a schedule at home to ensure medications are taken at the same time everyday. Patient also reporting poor sleep at baseline, SLP provided education on the importance of 7-8 hours of sleep a night for overall brain rest/health and strategies to utilize to assist with falling asleep (use of sound machine, reading, etc). Patient verbalized understanding. Patient left upright in wheelchair with husband present. Continue with current plan of care.      Pain No/Denies Pain   Therapy/Group: Individual Therapy  [place] Noori, [place] 08/28/2022, 3:13 PM

## 2022-08-28 NOTE — Progress Notes (Signed)
Occupational Therapy Session Note  Patient Details  Name: [name] MRN: [id number] Date of Birth: 1941/03/05  Today's Date: 08/28/2022 OT Individual Time: 0721-8288 OT Individual Time Calculation (min): 57 min   Short Term Goals: Week 1:  OT Short Term Goal 1 (Week 1): Pt will complete toilet transfers wiht LRAD and MIN A OT Short Term Goal 2 (Week 1): Pt will don shirt wiht MOD A OT Short Term Goal 3 (Week 1): Pt will recall hemi strategies wiht no more than min cuing OT Short Term Goal 4 (Week 1): Pt will thread BLE into pants with AE PRN  Skilled Therapeutic Interventions/Progress Updates:    Pt greeted in shower with husband. Nurse tech assisted with shower transfer, but husband was assisting with shower due to loose bowel incontinence this morning. Overall min A for bathing due to L hemiplegia. Pt with good recall of hemi dressing techniques and was able to thread shirt today with increased time and min verbal cues! LB dressing with min A to pull up pants and focus on forced use throughout all BADL tasks to integrate L UE for neuro re-ed. Worked on standing balance/endurance at the sink while  brushing teeth with pt reaching max fatigue after 2 minutes requiring rest break. Standing hair blow drying with L UE supported on sink for weight bearing and R hand drying hair. Tolerated 4 minutes standing this time. Towel pushes across sink to dry up water for NMR. Pt brought down to therapy gym where pt completed 5 mins on [employer] arm bike. L UE secured to handle using ace wraps. Last 30 seconds isolated L UE with min A from OT. Pt returned to room and OT placed SAEBO e-stim on wrist extensors. SAEBO left on for 60 minutes. OT returned to remove SAEBO with skin intact and no adverse reactions.  Saebo Stim One 330 pulse width 35 Hz pulse rate On 8 sec/ off 8 sec Ramp up/ down 2 sec Symmetrical Biphasic wave form  Max intensity 142m at 500 Ohm load Pt left seated in wc with spouse present and  needs met.    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 Restrictions Weight Bearing Restrictions: No Pain:  Denies pain   Therapy/Group: Individual Therapy  [name]9/21/2023, 9:06 AM

## 2022-08-28 NOTE — Progress Notes (Signed)
PROGRESS NOTE   Subjective/Complaints:  Had a loose stool this morning. Slept well last night. Left knee seems to be holding up.   ROS: Patient denies fever, rash, sore throat, blurred vision, dizziness, nausea, vomiting,   cough, shortness of breath or chest pain,   back/neck pain, headache, or mood change.   Objective:   No results found. No results for input(s): "WBC", "HGB", "HCT", "PLT" in the last 72 hours. No results for input(s): "NA", "K", "CL", "CO2", "GLUCOSE", "BUN", "CREATININE", "CALCIUM" in the last 72 hours.   Intake/Output Summary (Last 24 hours) at 08/28/2022 0916 Last data filed at 08/28/2022 0700 Gross per 24 hour  Intake 595 ml  Output --  Net 595 ml        Physical Exam:  Constitutional: No distress . Vital signs reviewed. HEENT: NCAT, EOMI, oral membranes moist Neck: supple Cardiovascular: RRR without murmur. No JVD    Respiratory/Chest: CTA Bilaterally without wheezes or rales. Normal effort    GI/Abdomen: BS +, non-tender, non-distended Ext: no clubbing, cyanosis, or edema Psych: pleasant and cooperative  Skin: Skin is warm and dry.     Comments: Paper thin with multiple ecchymotic areas LUE>RUE.   MSK: Left knee with crepitus during ROM and chronic changes visible       No apparent deformity.      Strength:                LUE: 3/5 SA, 2/5 EF, 2/5 EE, 1/5 WE, 1/5 FF, tr/5 FA ---stable appearance                RUE: 5/5 SA, 5/5 EF, 5/5 EE, 5/5 WE, 5/5 FF, 5/5 FA                 RLE: 5/5 HF, 5/5 KE, 5/5 DF, 5/5 EHL, 5/5 PF                 LLE:  5/5 HF, 5/5 KE, 5/5 DF, 5/5 EHL, 5/5 PF    Neurologic exam: alert and oriented to person and place, date, situation. Reasonable insight and awareness.s Language: Fluent, No substitutions or neoglisms. Mild dysarthria is improving  Sensation: To light touch intact in BL UEs and LEs  Reflexes: 2+ in BL UE and LEs. Negative Hoffman's and babinski signs  bilaterally.  CN: + L facial droop still present Coordination: No apparent tremors or ataxia. No resting tone         Assessment/Plan: 1. Functional deficits which require 3+ hours per day of interdisciplinary therapy in a comprehensive inpatient rehab setting. Physiatrist is providing close team supervision and 24 hour management of active medical problems listed below. Physiatrist and rehab team continue to assess barriers to discharge/monitor patient progress toward functional and medical goals  Care Tool:  Bathing    Body parts bathed by patient: Right arm, Chest, Abdomen, Front perineal area, Right upper leg, Left upper leg, Face   Body parts bathed by helper: Left arm, Buttocks, Right lower leg, Left lower leg     Bathing assist Assist Level: Moderate Assistance - Patient 50 - 74%     Upper Body Dressing/Undressing Upper body dressing  What is the patient wearing?: Pull over shirt    Upper body assist Assist Level: Maximal Assistance - Patient 25 - 49%    Lower Body Dressing/Undressing Lower body dressing      What is the patient wearing?: Pants, Underwear/pull up     Lower body assist Assist for lower body dressing: Maximal Assistance - Patient 25 - 49%     Toileting Toileting    Toileting assist Assist for toileting: Maximal Assistance - Patient 25 - 49%     Transfers Chair/bed transfer  Transfers assist     Chair/bed transfer assist level: Moderate Assistance - Patient 50 - 74%     Locomotion Ambulation   Ambulation assist      Assist level: 2 helpers (mod assist of 1 and +2 w/c follow) Assistive device: Other (comment) (wall hand rail on right side progressing to bilateral HHA) Max distance: 67f no turning   Walk 10 feet activity   Assist     Assist level: 2 helpers (mod assist of 1 and +2 w/c follow) Assistive device: Other (comment) (wall hand rail on right side progressing to bilateral HHA)   Walk 50 feet activity   Assist  Walk 50 feet with 2 turns activity did not occur: Safety/medical concerns         Walk 150 feet activity   Assist Walk 150 feet activity did not occur: Safety/medical concerns         Walk 10 feet on uneven surface  activity   Assist Walk 10 feet on uneven surfaces activity did not occur: Safety/medical concerns         Wheelchair     Assist Is the patient using a wheelchair?: Yes (Transport to and from therapy gym for energy conservation and time management.) Type of Wheelchair: Manual    Wheelchair assist level: Dependent - Patient 0% Max wheelchair distance: 150    Wheelchair 50 feet with 2 turns activity    Assist        Assist Level: Dependent - Patient 0%   Wheelchair 150 feet activity     Assist      Assist Level: Dependent - Patient 0%   Blood pressure (!) 146/61, pulse 60, temperature 98.2 F (36.8 C), temperature source Oral, resp. rate 16, height 4' 11.5" (1.511 m), weight 51.4 kg, SpO2 95 %.  Medical Problem List and Plan: 1. 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  Antithrombotics: -DVT/anticoagulation:  Pharmaceutical: Lovenox             -antiplatelet therapy: DAPT X 3 weeks followed by ASA alone.  3. Post herpetic neuralgia/Pain Management: Was on gabapentin for left facial and scalp pain.  --resumed gabapentin. - voltaren gel helpful for left knee. Might benefit from knee brace 4. Mood/Behavior/Sleep: LCSW to follow for evaluation and support.              -antipsychotic agents: N/A 5. Neuropsych/cognition: This patient is capable of making decisions on her own behalf. 6. Skin/Wound Care: Routine pressure  relief measures.   7. Fluids/Electrolytes/Nutrition: Monitor I/O. Check CMET in am.              - 9/18 - Mild AKI. Encouraging PO fluids, resolved     Latest Ref Rng & Units 08/25/2022    5:26 AM 08/23/2022    6:12 AM 08/21/2022    7:09 AM  BMP  Glucose 70 - 99 mg/dL 84  79  94   BUN 8 - 23 mg/dL '13  17  7   '$ Creatinine 0.44 - 1.00 mg/dL 0.88  1.01  0.73   Sodium 135 - 145 mmol/L 139  137  139   Potassium 3.5 - 5.1 mmol/L 3.6  3.8  3.3   Chloride 98 - 111 mmol/L 104  102  108   CO2 22 - 32 mmol/L '24  24  21   '$ Calcium 8.9 - 10.3 mg/dL 9.4  9.2  8.8     -9/21 have encouraged more drinking in AM to avoid HS voiding/sleep interruption 8. HTN: Monitor BP TID. Continue inderal and Cozaar.             - Per Neurology notes, permissive up to 220/110, target <200 and gradually decrease over 5-7 days (target date for normal 9/19)            - Hydralazine PRN 10 mg for SBP >220           - 9/16 - significant BP downtrend in last 24 hours; monitor           - 9/17 - remains elevated but sustained <180 SBP; adding Norvasc 5 mg daily. Will take 2-3 days for effect, but cautious to increase Losartan given AKI.     08/28/2022    5:09 AM 08/27/2022    7:42 PM 08/27/2022   12:56 PM  Vitals with BMI  Systolic [phone]  Diastolic 61 71 62  Pulse 60 73 77     Amlodipine started 9/17     9/21 persistent elevation--increased norvasc to '10mg'$  effective today 9. H/o occasional Headaches: Used ASA at home prn. . --continue Inderal. Resumed tramadol prn.  10. H/o anxiety d/o: Continue Cymbalta.  11. Hypokalemia: Likely dilutional from IVF--will supplement today X 1  --Normokalemic last 2 readings   12. Leucocytosis: Resolved   13. 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 H/o Asthma: Resume Singulair. Albuterol MDI prn as rescue.    15. Dysphagia: Multifactorial due to facial weakness, numbness? difficulty breathing due to sinus surgeries etc              --pt upgraded back to D3 diet which she prefers from a taste standpoint   -tolerating without issue 16. Constipation            - Small BM 9/15, 9/16, 9/17            - 9/16 - add Senna S 2 tabs QHS + Miralax BID      -last BM 9/17  -9/21 two large liquid bm's overnight and this morning.    -hold miralax today, can resume at qd tomorrow    LOS: 6 days A FACE TO FACE EVALUATION WAS PERFORMED  Meredith Staggers 08/28/2022, 9:16 AM

## 2022-08-28 NOTE — Evaluation (Signed)
Recreational Therapy Assessment and Plan  Patient Details  Name: [name] MRN: [id number] Date of Birth: 04-Feb-1941 Today's Date: 08/28/2022  Rehab Potential:  Good ELOS:   d/c 9/29  Assessment  Hospital Problem: Principal Problem:   Right thalamic stroke [facility])     Past Medical History:      Past Medical History:  Diagnosis Date   Anemia     Arthritis     Asthma      uses inhaler just prior to surgery to avoid attack   Back pain      from previous injury   Complication of anesthesia      has woken  up during 2 different surgery   Depression      no current issue/treatment; situation   Gallstones     GERD (gastroesophageal reflux disease)     Hiatal hernia      patient does NOT have nerve/muscle disease   History of kidney stones     HLD (hyperlipidemia)     HTN (hypertension)     Hypothyroidism     Kidney stones     Knee pain     Non-diabetic pancreatic hormone dysfunction years    pt. 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   Pancreatitis     Pneumonia     Seizures (HCC)      caused by dye injected during a procedure   Shortness of breath      with exertion   Sinus problem      frequent infections/congestion   Stroke ([place]) 2021    reports having CVA in 2021 and having mini strokes before that   Thyroid disease      Past Surgical History:       Past Surgical History:  Procedure Laterality Date   ABDOMINAL HYSTERECTOMY       APPENDECTOMY       CARPAL TUNNEL RELEASE   10+ years ago    bilateral   EYE SURGERY   3 yrs ago    bilateral cataracts   FOOT OSTEOTOMY   6 weeks ago    Left foot: great, 2nd & 3rd   FOOT OSTEOTOMY   5 years ago    Right great toe   HAND SURGERY Bilateral 2011-most recent    multiple hand surgeries, 2 on left, 3 on right   KNEE ARTHROPLASTY Right 04/28/2022    Procedure: COMPUTER ASSISTED TOTAL KNEE ARTHROPLASTY;  Surgeon: Dereck Leep, MD;  Location: [facility];  Service: Orthopedics;  Laterality: Right;   LOOP RECORDER  INSERTION N/A 05/16/2020    Procedure: LOOP RECORDER INSERTION;  Surgeon: Isaias Cowman, MD;  Location: [facility];  Service: Cardiovascular;  Laterality: N/A;   NASAL SINUS SURGERY   most recent 7-8 yrs ago    7 sinus surgeries    TRIGGER FINGER RELEASE   11/19/2011    Procedure: RELEASE TRIGGER FINGER/A-1 PULLEY;  Surgeon: Wynonia Sours, MD;  Location: [facility];  Service: Orthopedics;  Laterality: Right;  release a-1 pulley right index finger and cyst removal   WRIST GANGLION EXCISION   1980's    right      Assessment & Plan Clinical Impression:   Pt is a 81 y/o female admitted 9/12 with L sided weakness and aphasia.  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 PMH includes: HTN, seizure, CVA, shingles, complex migranes, anxiety, R TKA 05/2022 .  Pt presents with decreased activity tolerance, decreased  functional mobility, decreased balance, decreased coordination, left inattention, decreased attention, decreased awareness, decreased problem solving, decreased safety awareness, decreased memory, feelings of stress Limiting pt's independence with leisure/community pursuits.  Met with pt & husband today to discuss TR services including leisure education, activity analysis/modifications and stress management.  Also discussed the importance of social, emotional, spiritual health in addition to physical health and their effects on overall health and wellness.  Pt stated understanding.    Plan  Min 1 TR session >20 minutes during LOS  Recommendations for other services: Neuropsych  Discharge Criteria: Patient will be discharged from TR if patient refuses treatment 3 consecutive times without medical reason.  If treatment goals not met, if there is a change in medical status, if patient makes no progress towards goals or if patient is discharged from hospital.  The above assessment, treatment plan, treatment alternatives and goals were  discussed and mutually agreed upon: by patient  [place] 08/28/2022, 12:14 PM

## 2022-08-28 NOTE — Progress Notes (Signed)
Physical Therapy Session Note  Patient Details  Name: [name] MRN: [id number] Date of Birth: 1941/03/16  Today's Date: 08/28/2022 PT Individual Time: 1005-1100 and 2706-2376 PT Individual Time Calculation (min): 55 min and 27 min  Short Term Goals: Week 1:  PT Short Term Goal 1 (Week 1): Pt will perform supine <>sit with CGA consistently. PT Short Term Goal 2 (Week 1): Pt will perform sit <> stand transfers with min assist and LRAD consistently. PT Short Term Goal 3 (Week 1): Pt will perform stand pivot transfers with min assist and LRAD consistently. PT Short Term Goal 4 (Week 1): Pt will amb at least 90 ft with min assist and LRAD.  Skilled Therapeutic Interventions/Progress Updates:     1st Session: Pt received seated in [facility] and agrees to therapy. No complaint of pain but reports feeling very fatigued and having had a "rough morning" due to diarrhea. WC transport to gym for time management. Pt performs sit to stand with minA and has LOB backward, abruptly sitting back in WC. PT cues for safety and body mechanics and pt performs 2nd sit to stand and step transfer to mat with minA and cues for sequencing. PT provides pt with crackers and peanut butter and provides hand over hand assistance for L hand to stabilize peanut butter and assist with feeding. Following, pt ambulates 2x175' with RW and minA, with cues for upright gaze to improve posture and balance, and equalizing reciprocal gait pattern, lengthening L stride length, to improve gait mechanics. Seated rest break in between bouts. Pt then performs Nustep for reciprocal coordination training. PT ace wraps L upper extremity to facilitate grip and pt completes x12:00 at workload of 5 with average steps per minute ~45. WC transport back to room. Pt left seated in WC with all needs within reach.  2nd Session: Pt received seated in [facility] and agrees to therapy. No complaint of pain. WC transport to gym for time management. Stand step transfer to  mat with CGA and cues for sequencing. Sit to supine with verbal cues for positioning. Pt performs supine therex for NMR of L hemibody. Pt performs x10 reps supine bridges with verbal and tactile cues for body mechanics. Pt has slight muscle cramp in R hamstrings area. Rest break provided for pain relief. Pt then performs 3x10 single leg bridges with L lower extremity. Pt performs 3x10 SLRs with L lower extremity with multimodal cues for optimal performance. Supine to sit with verbal cues for body mechanics and positioning. Stand pivot transfer from mat>WC>bed with CGA/minA and tactile guidance of hips for safety. Pt left supine with alarm intact and all needs within reach.  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 Restrictions Weight Bearing Restrictions: No   Therapy/Group: Individual Therapy  Breck Coons, PT, DPT 08/28/2022, 3:42 PM

## 2022-08-29 MED ORDER — LOPERAMIDE HCL 2 MG PO CAPS: 2.0000 mg | ORAL_CAPSULE | ORAL | Status: DC | PRN

## 2022-08-29 NOTE — Progress Notes (Signed)
Physical Therapy Weekly Progress Note  Patient Details  Name: [name] MRN: [id number] Date of Birth: 25-Apr-1941  Beginning of progress report period: August 23, 2022 End of progress report period: August 29, 2022  Today's Date: 08/29/2022 PT Individual Time: 0930-1010 and 4917-9150 PT Individual Time Calculation (min): 40 min and 30 min  Patient has met 4 of 4 short term goals.  Pt is progressing well toward mobility goals, improving independence with bed mobility, balance, transfers, and ambulation. Pt is slightly impulsive and requires occasional education on safety awareness, but is otherwise performing mobility at a CGA to minA level. Pt will benefit from hands on family ed prior to DC.  Patient continues to demonstrate the following deficits muscle weakness, decreased cardiorespiratoy endurance, decreased coordination, and decreased sitting balance, decreased standing balance, decreased postural control, hemiplegia, and decreased balance strategies and therefore will continue to benefit from skilled PT intervention to increase functional independence with mobility.  Patient progressing toward long term goals..  Continue plan of care.  PT Short Term Goals Week 1:  PT Short Term Goal 1 (Week 1): Pt will perform supine <>sit with CGA consistently. PT Short Term Goal 1 - Progress (Week 1): Met PT Short Term Goal 2 (Week 1): Pt will perform sit <> stand transfers with min assist and LRAD consistently. PT Short Term Goal 2 - Progress (Week 1): Met PT Short Term Goal 3 (Week 1): Pt will perform stand pivot transfers with min assist and LRAD consistently. PT Short Term Goal 3 - Progress (Week 1): Met PT Short Term Goal 4 (Week 1): Pt will amb at least 90 ft with min assist and LRAD. PT Short Term Goal 4 - Progress (Week 1): Met Week 2:  PT Short Term Goal 1 (Week 2): STGs = LTGs  Skilled Therapeutic Interventions/Progress Updates:  Ambulation/gait training;Balance/vestibular  training;Community reintegration;Disease management/prevention;Discharge planning;DME/adaptive equipment instruction;Functional mobility training;Pain management;Psychosocial support;Splinting/orthotics;Therapeutic Activities;UE/LE Strength taining/ROM;UE/LE Coordination activities;Therapeutic Exercise;Stair training;Skin care/wound management;Patient/family education;Neuromuscular re-education;Functional electrical stimulation;Cognitive remediation/compensation;Visual/perceptual remediation/compensation   1st Session: Pt received seated in WC and agrees to therapy. No complaint of pain. WC transport to gym for time management. Pt performs BITS system to challenge standing balance, attention to the L, reaching outside BOS, activity tolerance, and coordination. Pt completes targeted reaching task initially and does not demonstrate any L inattention, routinely locating and touching targets in L visual field as quickly as targets in R visual field. Pt performs in standing with RW, with PT providing hand over hand manual pressure on L hand to promote increased WB and tactile feedback through L upper extremity. Following seated rest break, pt completes activity with memory challenge. Pt has increased difficulty with memory task, appearing to lose accuracy partially due to moving too quickly. PT provides education on importance of controlling movement to promote increased accuracy. On 2nd attempt, pt has much improved performance with memory task, and routinely reaches to L side without LOB. WC transport back to room. Stand pivot transfer to bed with minA and poor safety awareness, launching toward bed with little eccentric control. PT educates on safety and pt verbalizes understanding. Left supine with alarm intact and all needs within reach.  2nd Session: Pt received seated in recliner and agrees to therapy. Pt attempts to perform stand pivot transfer to [facility] prior to PT cueing pt to do so, and has LOB to the L,  requiring minA/modA to safely guide pt to WC. PT provides education on importance of waiting for supervision and assistance prior to mobilizing  due to pt's tendency for impulsivity. Pt verbalizes understanding. WC transport to gym for time management. Pt performs sit to stand with CGA and ambulates 3x175' with seated rest break. Pt does not utilize AD but has L upper extremity around PT waist with PT providing hand over hand pressure on L hand to promote tactile feedback as well as facilitating L lateral weight shift for symmetrical reciprocal gait pattern. PT also provides auditory cues to promote symmetrical stride length and cadence. Pt completes x10:00 on Nustep at workload of 5 with average steps per minute ~60. PT ace wraps L hand to promote improved grip and provides pt with cues on completing full, pain-free ROM. Completed for endurance training as well as reciprocal coordination training. Stand step transfer back to WC. Pt performs stand step transfer back to bed with minA and cues for sidestepping toward [place]. Left supine with alarm intact and all needs within reach.  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 Restrictions Weight Bearing Restrictions: No   Therapy/Group: Individual Therapy  Breck Coons, PT, DPT 08/29/2022, 4:41 PM

## 2022-08-29 NOTE — Progress Notes (Signed)
Occupational Therapy Session Note  Patient Details  Name: [name] MRN: [id number] Date of Birth: [birth year]  Today's Date: 08/29/2022 OT Individual Time: 4259-5638 OT Individual Time Calculation (min): 71 min    Short Term Goals: Week 1:  OT Short Term Goal 1 (Week 1): Pt will complete toilet transfers wiht LRAD and MIN A OT Short Term Goal 2 (Week 1): Pt will don shirt wiht MOD A OT Short Term Goal 3 (Week 1): Pt will recall hemi strategies wiht no more than min cuing OT Short Term Goal 4 (Week 1): Pt will thread BLE into pants with AE PRN  Skilled Therapeutic Interventions/Progress Updates:    Pt greeted sitting in wc and agreeable to OT treatment session. Pt already dressed and declined BADL tasks. Pt brought to therapy apartment and worked on [place] with weight bearing towel pushes on counter tops. Focus on high repetition with 3 sets of 30. OT then used NMES to facilitate finger flexion and elicit functional grasp. 15 minutes on just e-stim, then added functional task of cup stacking. OT turned off e-stim and pt able to activate grasp more functionally. She could grasp cup and stack up to 4 with guided A only. UB there-ex using SciFit arm bike. L UE placed on handle using Ace wrap. Pt completed 5 mins forward and 5 mins back. OT placed SAEBO e-stim on wrist extensors. SAEBO left on for 60 minutes. OT returned to remove SAEBO with skin intact and no adverse reactions.  [place] Stim One 330 pulse width 35 Hz pulse rate On 8 sec/ off 8 sec Ramp up/ down 2 sec Symmetrical Biphasic wave form  Max intensity 117m at 500 Ohm load  Pt left seated in wc with call bell in reach, and needs met.    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 Restrictions Weight Bearing Restrictions: No Pain:  Denies pain   Therapy/Group: Individual Therapy  [name]9/22/2023, 11:36 AM

## 2022-08-29 NOTE — Progress Notes (Signed)
Speech Language Pathology Weekly Progress and Session Note  Patient Details  Name: [name] MRN: [id number] Date of Birth: 01/29/1941  Beginning of progress report period: August 22, 2022 End of progress report period: August 29, 2022  Today's Date: 08/29/2022 SLP Individual Time: 0830-0933 SLP Individual Time Calculation (min): 63 min  Short Term Goals: Week 1: SLP Short Term Goal 1 (Week 1): Pt will consume regular textures over x2 trials with limits impairments such as bitting of inner buccal lining and no overt s/s aspiration, pior to upgrade of solids. SLP Short Term Goal 1 - Progress (Week 1): Not met SLP Short Term Goal 2 (Week 1): Pt will demonstrate recall of daily and complex infromation utilzing internal and external strategies with supervision A verbal cues. SLP Short Term Goal 2 - Progress (Week 1): Met SLP Short Term Goal 3 (Week 1): Pt will complete complex problem solving tasks (medication/money/time management) with supervision A verbal cues. SLP Short Term Goal 3 - Progress (Week 1): Met    New Short Term Goals: Week 2: SLP Short Term Goal 1 (Week 2): STGs=LTGs due to ELOS  Weekly Progress Updates: Patient has made functional gains and has met 2 of 3 STGs this reporting period. Initially, patient was downgraded to Dys. 2 textures by the physician due to reports of decreased mastication with pocketing. Currently, patient demonstrates improved oral-motor strength and is now consuming Dys. 3 textures with efficient mastication and complete oral clearance without overt s/s of aspiration with overall supervision-Mod I for use of swallowing compensatory strategies. Patient also currently requires overall supervision level verbal cues and extra time to complete functional and familiar tasks safely in regards to problem solving and recall with sue of compensatory strategies. Patient and family education ongoing. Patient would benefit from continued skilled SLP Intervention  to maximize her cognitive and swallowing function prior to discharge.     Intensity: Minumum of 1-2 x/day, 30 to 90 minutes Frequency: 3 to 5 out of 7 days Duration/Length of Stay: 10/05/22 Treatment/Interventions: Cognitive remediation/compensation;Cueing hierarchy;Functional tasks;Patient/family education;Internal/external aids;Dysphagia/aspiration precaution training;Therapeutic Activities;Environmental controls   Daily Session  Skilled Therapeutic Interventions:  Skilled treatment session focused on cognitive goals. Upon arrival, patient was asleep and required Mod verbal and tactile cues for arousal. Patient had been incontinent of bowel without awareness. Patient ambulated to the bathroom with the RW with Min verbal cues needed for attention to LUE throughout task. Patient performed peri care independently while sitting on the commode. Patient donned new clothes with supervision question cues needed for hemi dressing technique. Patient also performed basic self-care tasks at the sink with total A for set-up for time management. SLP facilitated session by providing extra time and Min A question cues for functional problem solving and organization during a complex scheduling task with appropriate awareness of errors. Patient left upright in the wheelchair with alarm on and all needs within reach. Continue with current plan of care.     Pain No/Denies Pain   Therapy/Group: Individual Therapy  PAYNE, COURTNEY 08/29/2022, 6:16 AM       

## 2022-08-29 NOTE — Progress Notes (Signed)
PROGRESS NOTE   Subjective/Complaints:  Had a loose stool this morning with another small accident later which she was unaware of. Otherwise doing well  ROS: Patient denies fever, rash, sore throat, blurred vision, dizziness, nausea, vomiting,   cough, shortness of breath or chest pain, joint or back/neck pain, headache, or mood change.   Objective:   No results found. No results for input(s): "WBC", "HGB", "HCT", "PLT" in the last 72 hours. No results for input(s): "NA", "K", "CL", "CO2", "GLUCOSE", "BUN", "CREATININE", "CALCIUM" in the last 72 hours.   Intake/Output Summary (Last 24 hours) at 08/29/2022 1118 Last data filed at 08/29/2022 0724 Gross per 24 hour  Intake 475 ml  Output --  Net 475 ml        Physical Exam:  Constitutional: No distress . Vital signs reviewed. HEENT: NCAT, EOMI, oral membranes moist Neck: supple Cardiovascular: RRR without murmur. No JVD    Respiratory/Chest: CTA Bilaterally without wheezes or rales. Normal effort    GI/Abdomen: BS +, non-tender, non-distended Ext: no clubbing, cyanosis, or edema Psych: pleasant and cooperative  Skin: Skin is warm and dry.     Comments: Paper thin with multiple ecchymotic areas LUE>RUE.   MSK: Left knee with crepitus during ROM and chronic changes visible       No apparent deformity. Neuro:     Strength:                LUE: 3/5 SA, 2/5 EF, 2/5 EE, 1/5 WE, 1/5 FF, tr/5 FA -no change today                LLE 4-5/5 RUE and RLE 4+ to 5/5   -good sitting balance She remains alert and oriented to person and place, date, situation. Reasonable insight and awareness.s Language: Fluent, No substitutions or neoglisms. Mild dysarthria is improving  Sensation: To light touch intact in BL UEs and LEs  Reflexes: 2+ in BL UE and LEs. Negative Hoffman's and babinski signs bilaterally.  CN: + L facial droop still present Coordination: No apparent tremors or ataxia. No  resting tone         Assessment/Plan: 1. Functional deficits which require 3+ hours per day of interdisciplinary therapy in a comprehensive inpatient rehab setting. Physiatrist is providing close team supervision and 24 hour management of active medical problems listed below. Physiatrist and rehab team continue to assess barriers to discharge/monitor patient progress toward functional and medical goals  Care Tool:  Bathing    Body parts bathed by patient: Right arm, Chest, Abdomen, Front perineal area, Right upper leg, Left upper leg, Face   Body parts bathed by helper: Left arm, Buttocks, Right lower leg, Left lower leg     Bathing assist Assist Level: Moderate Assistance - Patient 50 - 74%     Upper Body Dressing/Undressing Upper body dressing   What is the patient wearing?: Pull over shirt    Upper body assist Assist Level: Maximal Assistance - Patient 25 - 49%    Lower Body Dressing/Undressing Lower body dressing      What is the patient wearing?: Pants, Underwear/pull up     Lower body assist Assist for lower body  dressing: Maximal Assistance - Patient 25 - 49%     Toileting Toileting    Toileting assist Assist for toileting: Maximal Assistance - Patient 25 - 49%     Transfers Chair/bed transfer  Transfers assist     Chair/bed transfer assist level: Moderate Assistance - Patient 50 - 74%     Locomotion Ambulation   Ambulation assist      Assist level: 2 helpers (mod assist of 1 and +2 w/c follow) Assistive device: Other (comment) (wall hand rail on right side progressing to bilateral HHA) Max distance: 68f no turning   Walk 10 feet activity   Assist     Assist level: 2 helpers (mod assist of 1 and +2 w/c follow) Assistive device: Other (comment) (wall hand rail on right side progressing to bilateral HHA)   Walk 50 feet activity   Assist Walk 50 feet with 2 turns activity did not occur: Safety/medical concerns         Walk 150  feet activity   Assist Walk 150 feet activity did not occur: Safety/medical concerns         Walk 10 feet on uneven surface  activity   Assist Walk 10 feet on uneven surfaces activity did not occur: Safety/medical concerns         Wheelchair     Assist Is the patient using a wheelchair?: Yes (Transport to and from therapy gym for energy conservation and time management.) Type of Wheelchair: Manual    Wheelchair assist level: Dependent - Patient 0% Max wheelchair distance: 150    Wheelchair 50 feet with 2 turns activity    Assist        Assist Level: Dependent - Patient 0%   Wheelchair 150 feet activity     Assist      Assist Level: Dependent - Patient 0%   Blood pressure (!) 146/61, pulse 63, temperature 97.9 F (36.6 C), temperature source Oral, resp. rate 18, height 4' 11.5" (1.511 m), weight 51.4 kg, SpO2 97 %.  Medical Problem List and Plan: 1. 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  Antithrombotics: -DVT/anticoagulation:  Pharmaceutical: Lovenox             -antiplatelet therapy: DAPT X 3 weeks followed by ASA alone.  3. Post herpetic neuralgia/Pain Management: Was on gabapentin for left facial and scalp pain.  --resumed gabapentin. - voltaren gel helpful for left knee. Might benefit from knee brace 4. Mood/Behavior/Sleep: LCSW to follow for evaluation and support.              -antipsychotic agents: N/A 5. Neuropsych/cognition: This patient is capable of making decisions on her own behalf. 6. Skin/Wound Care: Routine pressure relief measures.  7. Fluids/Electrolytes/Nutrition: Monitor I/O. Check CMET in am.              - 9/18 - Mild  AKI. Encouraging PO fluids, resolved     Latest Ref Rng & Units 08/25/2022    5:26 AM 08/23/2022    6:12 AM 08/21/2022  7:09 AM  BMP  Glucose 70 - 99 mg/dL 84  79  94   BUN 8 - 23 mg/dL '13  17  7   '$ Creatinine 0.44 - 1.00 mg/dL 0.88  1.01  0.73   Sodium 135 - 145 mmol/L 139  137  139   Potassium 3.5 - 5.1 mmol/L 3.6  3.8  3.3   Chloride 98 - 111 mmol/L 104  102  108   CO2 22 - 32 mmol/L '24  24  21   '$ Calcium 8.9 - 10.3 mg/dL 9.4  9.2  8.8     -9/21-22 have encouraged more drinking in AM to avoid HS voiding/sleep interruption  -recheck labs Monday  8. HTN: Monitor BP TID. Continue inderal and Cozaar.             - Per Neurology notes, permissive up to 220/110, target <200 and gradually decrease over 5-7 days (target date for normal 9/19)            - Hydralazine PRN 10 mg for SBP >220           - 9/16 - significant BP downtrend in last 24 hours; monitor           - 9/17 - remains elevated but sustained <180 SBP; adding Norvasc 5 mg daily. Will take 2-3 days for effect, but cautious to increase Losartan given AKI.     08/29/2022    4:09 AM 08/28/2022    7:48 PM 08/28/2022    1:20 PM  Vitals with BMI  Systolic 502 774 128  Diastolic 61 62 54  Pulse 63 69 70     Amlodipine started 9/17     9/22 improved control with increase of norvasc to '10mg'$  9/21 9. H/o occasional Headaches: Used ASA at home prn. . --continue Inderal. Resumed tramadol prn.  10. H/o anxiety d/o: Continue Cymbalta.  11. Hypokalemia: Likely dilutional from IVF--will supplement today X 1  --Normokalemic last 2 readings   12. Leucocytosis: Resolved   13. 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 H/o Asthma: Resume Singulair. Albuterol MDI prn as rescue.    15. Dysphagia: Multifactorial due to facial weakness, numbness? difficulty breathing due to sinus surgeries etc             --pt upgraded back to D3 diet which she prefers from a taste  standpoint   -tolerating without issue 16. Constipation            - Small BM 9/15, 9/16, 9/17            - 9/16 - add Senna S 2 tabs QHS + Miralax BID      -last BM 9/17  -9/22 still with loose stools and incontinence   -holding all scheduled soft/lax   -added imodium prn    LOS: 7 days A FACE TO FACE EVALUATION WAS PERFORMED  Meredith Staggers 08/29/2022, 11:18 AM

## 2022-08-30 NOTE — Progress Notes (Signed)
Physical Therapy Session Note  Patient Details  Name: [name] MRN: [id number] Date of Birth: 07/20/1941  Today's Date: 08/30/2022 PT Individual Time: 0915-1000 PT Individual Time Calculation (min): 45 min   Short Term Goals: Week 1:  PT Short Term Goal 1 (Week 1): Pt will perform supine <>sit with CGA consistently. PT Short Term Goal 1 - Progress (Week 1): Met PT Short Term Goal 2 (Week 1): Pt will perform sit <> stand transfers with min assist and LRAD consistently. PT Short Term Goal 2 - Progress (Week 1): Met PT Short Term Goal 3 (Week 1): Pt will perform stand pivot transfers with min assist and LRAD consistently. PT Short Term Goal 3 - Progress (Week 1): Met PT Short Term Goal 4 (Week 1): Pt will amb at least 90 ft with min assist and LRAD. PT Short Term Goal 4 - Progress (Week 1): Met  Skilled Therapeutic Interventions/Progress Updates:  Pt was seen bedside in the am. Pt finishing up with toileting with CNA. Pt transported to rehab gym. Pt performed multiple sit to stands with rolling walker and c/g and verbal cues and transfers with c/g to min A. Pt performed cone taps and alternating cone taps, 3 sets x 10 reps each for NMR. Pt ambulated 140 feet with rolling walker and c/g to min a with verbal cues. Pt returned to room and left sitting up in wc with chair alarm in place and husband at bedside.   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 Restrictions Weight Bearing Restrictions: No General:  Pain: No c/o pain.     Therapy/Group: Individual Therapy  Dub Amis 08/30/2022, 12:44 PM

## 2022-08-30 NOTE — Progress Notes (Signed)
Speech Language Pathology Daily Session Note  Patient Details  Name: [name] MRN: [id number] Date of Birth: 14-May-1941  Today's Date: 08/30/2022 SLP Individual Time: 7225-7505 SLP Individual Time Calculation (min): 45 min  Short Term Goals: Week 2: SLP Short Term Goal 1 (Week 2): STGs=LTGs due to ELOS  Skilled Therapeutic Interventions: S: Pt seen this date for skilled ST intervention targeting cognitive goals outlined in care plan. Pt received awake/alert and attempting to get OOB to go to the bathroom with husband present; SLP provided Min A with RW, per safety plan; reviewed safety plan with pt and spouse. Continent of bowel and bladder. Agreeable to ST intervention in speech office.  O: SLP facilitated today's session by providing: - Sup A question cues for ID of needed items for safely transferring from bed to bathroom for toileting  (e.g. grip socks, gait belt, walker).  - Sup A question prompts for recalling and implementing L hemi dressing techniques. - Mod I for completing written sequencing for household ADL tasks to achieve ~80% accuracy. - Monthly calendar, at the request of the pt, to assist with organization and planning. - Min A verbal cues for visual attention to L field and LUE. - Education re: neuro recovery for cognitive rehab.  A: Pt appears very sitmulable for skilled ST intervention. Benefits from overall Sup A question prompts for complex problem-solving and recall, and Min A for L attention.  P: Pt returned to room and left OOB in recliner chair, with all safety measures activated. Call bell reviewed and within reach and all immediate needs met. Spouse present. Continue per current ST POC next session. Specifically, pt voiced that she would like to address medication management/pill organizer, in upcoming sessions, to prepare for upcoming d/c home.  Pain None reported/Denies  Therapy/Group: Individual Therapy  Deosha Werden A Ralph Benavidez 08/30/2022, 12:09 PM

## 2022-08-30 NOTE — Progress Notes (Signed)
PROGRESS NOTE   Subjective/Complaints:  Had a little anxiety last night. Sleeping soundly when I came in. Had a formed stool mid day yesterday.   ROS: Patient denies fever, rash, sore throat, blurred vision, dizziness, nausea, vomiting, diarrhea, cough, shortness of breath or chest pain, joint or back/neck pain, headache   .   Objective:   No results found. No results for input(s): "WBC", "HGB", "HCT", "PLT" in the last 72 hours. No results for input(s): "NA", "K", "CL", "CO2", "GLUCOSE", "BUN", "CREATININE", "CALCIUM" in the last 72 hours.   Intake/Output Summary (Last 24 hours) at 08/30/2022 0914 Last data filed at 08/30/2022 0738 Gross per 24 hour  Intake 521 ml  Output --  Net 521 ml        Physical Exam:  Constitutional: No distress . Vital signs reviewed. HEENT: NCAT, EOMI, oral membranes moist Neck: supple Cardiovascular: RRR without murmur. No JVD    Respiratory/Chest: CTA Bilaterally without wheezes or rales. Normal effort    GI/Abdomen: BS +, non-tender, non-distended Ext: no clubbing, cyanosis, or edema Psych: sleepy this morning but pleasant Skin: Skin is warm and dry.     Comments: Paper thin with multiple ecchymotic areas LUE>RUE.   MSK: Left knee with crepitus during ROM and chronic changes visible       No apparent deformity. Neuro:     Strength:                LUE: 3/5 SA, 2/5 EF, 2/5 EE, 1/5 WE, 1/5 FF, tr/5 FA -no changes today                LLE 4-5/5 RUE and RLE 4+ to 5/5  She remains alert and oriented to person and place, date, situation. Reasonable insight and awareness.s Language: Fluent, No substitutions or neoglisms. Mild dysarthria is improving  Sensation: To light touch intact in BL UEs and LEs  Reflexes: 2+ in BL UE and LEs. Negative Hoffman's and babinski signs bilaterally.  CN: + L facial droop still present Coordination: No apparent tremors or ataxia. No resting tone          Assessment/Plan: 1. Functional deficits which require 3+ hours per day of interdisciplinary therapy in a comprehensive inpatient rehab setting. Physiatrist is providing close team supervision and 24 hour management of active medical problems listed below. Physiatrist and rehab team continue to assess barriers to discharge/monitor patient progress toward functional and medical goals  Care Tool:  Bathing    Body parts bathed by patient: Right arm, Chest, Abdomen, Front perineal area, Right upper leg, Left upper leg, Face   Body parts bathed by helper: Left arm, Buttocks, Right lower leg, Left lower leg     Bathing assist Assist Level: Moderate Assistance - Patient 50 - 74%     Upper Body Dressing/Undressing Upper body dressing   What is the patient wearing?: Pull over shirt    Upper body assist Assist Level: Maximal Assistance - Patient 25 - 49%    Lower Body Dressing/Undressing Lower body dressing      What is the patient wearing?: Pants, Underwear/pull up     Lower body assist Assist for lower body dressing: Maximal Assistance -  Patient 25 - 49%     Toileting Toileting    Toileting assist Assist for toileting: Maximal Assistance - Patient 25 - 49%     Transfers Chair/bed transfer  Transfers assist     Chair/bed transfer assist level: Moderate Assistance - Patient 50 - 74%     Locomotion Ambulation   Ambulation assist      Assist level: 2 helpers (mod assist of 1 and +2 w/c follow) Assistive device: Other (comment) (wall hand rail on right side progressing to bilateral HHA) Max distance: 94f no turning   Walk 10 feet activity   Assist     Assist level: 2 helpers (mod assist of 1 and +2 w/c follow) Assistive device: Other (comment) (wall hand rail on right side progressing to bilateral HHA)   Walk 50 feet activity   Assist Walk 50 feet with 2 turns activity did not occur: Safety/medical concerns         Walk 150 feet  activity   Assist Walk 150 feet activity did not occur: Safety/medical concerns         Walk 10 feet on uneven surface  activity   Assist Walk 10 feet on uneven surfaces activity did not occur: Safety/medical concerns         Wheelchair     Assist Is the patient using a wheelchair?: Yes (Transport to and from therapy gym for energy conservation and time management.) Type of Wheelchair: Manual    Wheelchair assist level: Dependent - Patient 0% Max wheelchair distance: 150    Wheelchair 50 feet with 2 turns activity    Assist        Assist Level: Dependent - Patient 0%   Wheelchair 150 feet activity     Assist      Assist Level: Dependent - Patient 0%   Blood pressure (!) 165/72, pulse 73, temperature 97.9 F (36.6 C), temperature source Oral, resp. rate 18, height 4' 11.5" (1.511 m), weight 51.4 kg, SpO2 97 %.  Medical Problem List and Plan: 1. 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  Antithrombotics: -DVT/anticoagulation:  Pharmaceutical: Lovenox             -antiplatelet therapy: DAPT X 3 weeks followed by ASA alone.  3. Post herpetic neuralgia/Pain Management: Was on gabapentin for left facial and scalp pain.  --resumed gabapentin. - voltaren gel helpful for left knee. Might benefit from knee brace 4. Mood/Behavior/Sleep: LCSW to follow for evaluation and support.              -antipsychotic agents: N/A 5. Neuropsych/cognition: This patient is capable of making decisions on her own behalf. 6. Skin/Wound Care: Routine pressure relief measures.  7. Fluids/Electrolytes/Nutrition: Monitor I/O. Check CMET in am.              - 9/18 - Mild AKI.  Encouraging PO fluids, resolved     Latest Ref Rng & Units 08/25/2022    5:26 AM 08/23/2022    6:12 AM 08/21/2022  7:09 AM  BMP  Glucose 70 - 99 mg/dL 84  79  94   BUN 8 - 23 mg/dL '13  17  7   '$ Creatinine 0.44 - 1.00 mg/dL 0.88  1.01  0.73   Sodium 135 - 145 mmol/L 139  137  139   Potassium 3.5 - 5.1 mmol/L 3.6  3.8  3.3   Chloride 98 - 111 mmol/L 104  102  108   CO2 22 - 32 mmol/L '24  24  21   '$ Calcium 8.9 - 10.3 mg/dL 9.4  9.2  8.8     -9/23 pushing fluids  -recheck labs Monday  8. HTN: Monitor BP TID. Continue inderal and Cozaar.             - Per Neurology notes, permissive up to 220/110, target <200 and gradually decrease over 5-7 days (target date for normal 9/19)            - Hydralazine PRN 10 mg for SBP >220           - 9/16 - significant BP downtrend in last 24 hours; monitor           - 9/17 - remains elevated but sustained <180 SBP; adding Norvasc 5 mg daily. Will take 2-3 days for effect, but cautious to increase Losartan given AKI.     08/30/2022    4:27 AM 08/29/2022    7:32 PM 08/29/2022    1:24 PM  Vitals with BMI  Systolic [phone]  Diastolic 72 71 65  Pulse 73 78 73     Amlodipine started 9/17     9/23 generally improved control with increase of norvasc to '10mg'$  9/21 but has increased at times when she's anxious--no changes today 9. H/o occasional Headaches: Used ASA at home prn. . --continue Inderal. Resumed tramadol prn.  10. H/o anxiety d/o: Continue Cymbalta.  11. Hypokalemia: Likely dilutional from IVF--will supplement today X 1  --Normokalemic last 2 readings   12. Leucocytosis: Resolved   13. 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 H/o Asthma: Resume Singulair. Albuterol MDI prn as rescue.    15. Dysphagia: Multifactorial due to facial weakness, numbness? difficulty breathing due to sinus surgeries etc             --pt upgraded back to D3 diet which she prefers from a taste  standpoint   -tolerating without issue 16. Constipation            - Small BM 9/15, 9/16, 9/17            - 9/16 - add Senna S 2 tabs QHS + Miralax BID      -last BM 9/17  -9/23 no loose stools x 24 hours   -holding all scheduled soft/lax   -added imodium prn    LOS: 8 days A FACE TO FACE EVALUATION WAS PERFORMED  Meredith Staggers 08/30/2022, 9:14 AM

## 2022-08-31 NOTE — Progress Notes (Signed)
Physical Therapy Session Note  Patient Details  Name: [name] MRN: [id number] Date of Birth: Aug 28, 1941  Today's Date: 08/31/2022 PT Individual Time: 0800-0845 PT Individual Time Calculation (min): 45 min   Short Term Goals: Week 2:  PT Short Term Goal 1 (Week 2): STGs = LTGs  Skilled Therapeutic Interventions/Progress Updates:    Pt seated in w/c on arrival and agreeable to therapy. Pt transported to therapy gym for time management and energy conservation.  Pt ambulated with RW and CGA x 180 ft. Pt reported mild pain in L knee during session, reports this is normal and improving arthritic pain. Pt with decr stance time on LLE and pt reports instability. Session focused on forced use of LLE during sit to stand to tolerance, no increased pain at this time and no intervention required. Pt performed Sit to stand with RW 3 x 12 with RLE placed on yoga block for forced use of LLE. After this, discussed standing mechanics using NDT principles for improved anterior weight shift. Pt often moves impulsively so benefited from slow repetition with cueing and manual facilitation. Returned to pt room and wrote down steps for improved recall. Pt with difficulty maintaining anterior pelvic tilt and coordinating power up without cueing. Pt remained in recliner at end of session and was left with all needs in reach and alarm active.   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 Restrictions Weight Bearing Restrictions: No General:       Therapy/Group: Individual Therapy  [name] 08/31/2022, 12:44 PM

## 2022-09-01 ENCOUNTER — Ambulatory Visit: Admit: 2022-09-01 | Payer: Medicare HMO | Admitting: Orthopedic Surgery

## 2022-09-01 DIAGNOSIS — M1712 Unilateral primary osteoarthritis, left knee: Secondary | ICD-10-CM

## 2022-09-01 DIAGNOSIS — R768 Other specified abnormal immunological findings in serum: Secondary | ICD-10-CM

## 2022-09-01 DIAGNOSIS — E039 Hypothyroidism, unspecified: Secondary | ICD-10-CM

## 2022-09-01 DIAGNOSIS — E876 Hypokalemia: Secondary | ICD-10-CM

## 2022-09-01 LAB — BASIC METABOLIC PANEL WITH GFR
Anion gap: 9 (ref 5–15)
BUN: 14 mg/dL (ref 8–23)
CO2: 25 mmol/L (ref 22–32)
Calcium: 9.5 mg/dL (ref 8.9–10.3)
Chloride: 103 mmol/L (ref 98–111)
Creatinine, Ser: 0.8 mg/dL (ref 0.44–1.00)
GFR, Estimated: >60 mL/min
Glucose, Bld: 83 mg/dL (ref 70–99)
Potassium: 3.7 mmol/L (ref 3.5–5.1)
Sodium: 137 mmol/L (ref 135–145)

## 2022-09-01 SURGERY — ARTHROPLASTY, KNEE, TOTAL, USING IMAGELESS COMPUTER-ASSISTED NAVIGATION
Anesthesia: Choice | Site: Knee | Laterality: Left

## 2022-09-01 MED ORDER — AMLODIPINE BESYLATE 10 MG PO TABS
10.0000 mg | ORAL_TABLET | Freq: Every day | ORAL | Status: DC
  Administered 2022-09-02 – 2022-09-05 (×4): 10 mg via ORAL
  Filled 2022-09-01 (×4): qty 1

## 2022-09-01 NOTE — Progress Notes (Signed)
PROGRESS NOTE   Subjective/Complaints:  No acute events overnight. No complaints. States knee pain is improving with gel. Did discuss OP BP management, states her PCP (Jeffery Sparks in [place]) was managing but categorized as "unable to control". Doesn't think she has ever had a renal US. Agreeable to continuing to titrate medications.   ROS: Patient denies fever, rash, sore throat, blurred vision, dizziness, nausea, vomiting, diarrhea, cough, shortness of breath or chest pain, joint or back/neck pain, headache   .   Objective:   No results found. No results for input(s): "WBC", "HGB", "HCT", "PLT" in the last 72 hours. No results for input(s): "NA", "K", "CL", "CO2", "GLUCOSE", "BUN", "CREATININE", "CALCIUM" in the last 72 hours.   Intake/Output Summary (Last 24 hours) at 09/01/2022 1012 Last data filed at 09/01/2022 0700 Gross per 24 hour  Intake 456 ml  Output --  Net 456 ml         Physical Exam:  Constitutional: No distress . Vital signs reviewed. HEENT: NCAT, EOMI, oral membranes moist Neck: supple Cardiovascular: RRR without murmur. No JVD    Respiratory/Chest: CTA Bilaterally without wheezes or rales. Normal effort    GI/Abdomen: BS +, non-tender, non-distended Ext: no clubbing, cyanosis, or edema Psych:  pleasant, appropriate mood and affect.  Skin: Skin is warm and dry.     Comments: Paper thin with multiple ecchymotic areas LUE>RUE.  - stable MSK: Left knee with crepitus during ROM and chronic changes visible       No apparent deformity. Neuro:     AAOx3. +L facial droop. Mild dysarthria.   Prior exams not repeated today: Strength:                LUE: 3/5 SA, 2/5 EF, 2/5 EE, 1/5 WE, 1/5 FF, tr/5 FA -no changes today                LLE 4-5/5 RUE and RLE 4+ to 5/5        Assessment/Plan: 1. Functional deficits which require 3+ hours per day of interdisciplinary therapy in a comprehensive inpatient  rehab setting. Physiatrist is providing close team supervision and 24 hour management of active medical problems listed below. Physiatrist and rehab team continue to assess barriers to discharge/monitor patient progress toward functional and medical goals  Care Tool:  Bathing    Body parts bathed by patient: Right arm, Chest, Abdomen, Front perineal area, Right upper leg, Left upper leg, Face   Body parts bathed by helper: Left arm, Buttocks, Right lower leg, Left lower leg     Bathing assist Assist Level: Moderate Assistance - Patient 50 - 74%     Upper Body Dressing/Undressing Upper body dressing   What is the patient wearing?: Pull over shirt    Upper body assist Assist Level: Maximal Assistance - Patient 25 - 49%    Lower Body Dressing/Undressing Lower body dressing      What is the patient wearing?: Pants, Underwear/pull up     Lower body assist Assist for lower body dressing: Maximal Assistance - Patient 25 - 49%     Toileting Toileting    Toileting assist Assist for toileting: Maximal Assistance - Patient  25 - 49%     Transfers Chair/bed transfer  Transfers assist     Chair/bed transfer assist level: Minimal Assistance - Patient > 75%     Locomotion Ambulation   Ambulation assist      Assist level: Minimal Assistance - Patient > 75% Assistive device: Walker-rolling Max distance: 140   Walk 10 feet activity   Assist     Assist level: Minimal Assistance - Patient > 75% Assistive device: Walker-rolling   Walk 50 feet activity   Assist Walk 50 feet with 2 turns activity did not occur: Safety/medical concerns  Assist level: Minimal Assistance - Patient > 75% Assistive device: Walker-rolling    Walk 150 feet activity   Assist Walk 150 feet activity did not occur: Safety/medical concerns         Walk 10 feet on uneven surface  activity   Assist Walk 10 feet on uneven surfaces activity did not occur: Safety/medical  concerns         Wheelchair     Assist Is the patient using a wheelchair?: Yes (Transport to and from therapy gym for energy conservation and time management.) Type of Wheelchair: Manual    Wheelchair assist level: Dependent - Patient 0% Max wheelchair distance: 150    Wheelchair 50 feet with 2 turns activity    Assist        Assist Level: Dependent - Patient 0%   Wheelchair 150 feet activity     Assist      Assist Level: Dependent - Patient 0%   Blood pressure (!) 145/71, pulse 64, temperature 97.9 F (36.6 C), temperature source Oral, resp. rate 18, height 4' 11.5" (1.511 m), weight 51.4 kg, SpO2 98 %.  Medical Problem List and Plan: 1. 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  Antithrombotics: -DVT/anticoagulation:  Pharmaceutical: Lovenox             -antiplatelet therapy: DAPT X 3 weeks followed by ASA alone.  3. Post herpetic neuralgia/Pain Management: Was on gabapentin for left facial and scalp pain.  --resumed gabapentin. - voltaren gel helpful for left knee. Might benefit from knee brace 4. Mood/Behavior/Sleep: LCSW to follow for evaluation and support.              -antipsychotic agents: N/A 5. Neuropsych/cognition: This patient is capable of making decisions on her own behalf. 6. Skin/Wound Care: Routine pressure relief measures.  7. Fluids/Electrolytes/Nutrition: Monitor I/O. Check CMET in am.              - 9/18 - Mild AKI. Encouraging PO fluids, resolved     Latest Ref Rng & Units 08/25/2022    5:26 AM 08/23/2022    6:12 AM 08/21/2022    7:09 AM  BMP  Glucose 70 - 99 mg/dL 84  79  94   BUN 8 - 23 mg/dL '13  17  7   '$ Creatinine 0.44 - 1.00  mg/dL 0.88  1.01  0.73   Sodium 135 - 145 mmol/L 139  137  139   Potassium 3.5 - 5.1 mmol/L 3.6  3.8  3.3   Chloride 98 - 111 mmol/L 104  102  108   CO2 22 - 32 mmol/L '24  24  21   '$ Calcium 8.9 - 10.3 mg/dL 9.4  9.2  8.8     -9/23 pushing fluids  -recheck labs Monday - Pending 8. HTN: Monitor BP TID. Continue inderal and Cozaar.             - Per Neurology notes, permissive up to 220/110, target <200 and gradually decrease over 5-7 days (target date for normal 9/19)            - Hydralazine PRN 10 mg for SBP >220           - 9/16 - significant BP downtrend in last 24 hours; monitor           - 9/17 - remains elevated but sustained <180 SBP; adding Norvasc 5 mg daily. Will take 2-3 days for effect, but cautious to increase Losartan given AKI.             - 9/25 - inc norvasc to 10 mg ; would consider renal US as OP if not already performed    09/01/2022    3:29 AM 08/31/2022    7:39 PM 08/31/2022    1:17 PM  Vitals with BMI  Systolic [phone]  Diastolic 71 71 61  Pulse 64 78 73        9. H/o occasional Headaches: Used ASA at home prn. . - resolved --continue Inderal. Resumed tramadol prn.  10. H/o anxiety d/o: Continue Cymbalta.  11. Hypokalemia: Likely dilutional from IVF--will supplement today X 1  --Normokalemic last 2 readings   12. Leucocytosis: Resolved   13. 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 H/o Asthma: Resume Singulair. Albuterol MDI prn as rescue.    15. Dysphagia: Multifactorial due to facial weakness, numbness? difficulty breathing due to sinus surgeries etc             --pt upgraded back to D3 diet which she prefers from a taste standpoint   -tolerating without issue 16. Constipation            - 9/16 - add Senna S 2 tabs QHS + Miralax BID  -9/23 no loose stools x 24 hours   -holding all scheduled soft/lax   -added imodium prn           - Lbm 9/24, large  LOS: 10 days A FACE  TO [place] 09/01/2022, 10:12 AM

## 2022-09-01 NOTE — Progress Notes (Signed)
Occupational Therapy Session Note  Patient Details  Name: [name] MRN: [id number] Date of Birth: 1940-12-11  Today's Date: 09/01/2022 OT Individual Time: 1100-1157 OT Individual Time Calculation (min): 57 min    Short Term Goals: Week 1:  OT Short Term Goal 1 (Week 1): Pt will complete toilet transfers wiht LRAD and MIN A OT Short Term Goal 2 (Week 1): Pt will don shirt wiht MOD A OT Short Term Goal 3 (Week 1): Pt will recall hemi strategies wiht no more than min cuing OT Short Term Goal 4 (Week 1): Pt will thread BLE into pants with AE PRN  Skilled Therapeutic Interventions/Progress Updates:    Pt resting in recliner upon arrival with husband present. OT intervention with focus on sit<>stand, standing balance, stand pivot tranfsers, LUE therapeutic activities, LUE NMR, and safety awareness to increase independence with BADLs. Sit<>stand, standing balance, and stand pivot transfers with min A and mod verbal cues for safety awareness. Sitting balance EOM with supervision. Pt initially engaged in reaching tasks with focus on shoulder flexion/extension, elbow flexion/extension, and horizontal abduction/adduction. Multimodal cues for muscle group activation and limit compensatory techniques. LUE NMR/NMES:  1:1 NMES applied to LUE finger/wrist extensors to facilitate wrist/finger extension  Ratio 1:3 Rate 35 pps Waveform- Asymmetric Ramp 1.0 Pulse 300 Intensity- 19 Duration -   12 mins   No adverse reactions after treatment and is skin intact.   Pt returned to room and transferred to recliner. Seat alarm activated. Husband present and all needs within reach.    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 Restrictions Weight Bearing Restrictions: No  Pain:  Pt denies pain this morning   Therapy/Group: Individual Therapy  Leroy Libman 09/01/2022, 12:12 PM

## 2022-09-01 NOTE — Progress Notes (Signed)
Speech Language Pathology Daily Session Note  Patient Details  Name: [name] MRN: [id number] Date of Birth: Apr 19, 1941  Today's Date: 09/01/2022 SLP Individual Time: 1275-1700 SLP Individual Time Calculation (min): 42 min  Short Term Goals: Week 2: SLP Short Term Goal 1 (Week 2): STGs=LTGs due to ELOS  Skilled Therapeutic Interventions: Skilled treatment session focused on cognitive goals. SLP facilitated session by providing overall Mod I for problem solving, recall, and organization during an appointment input task. Patient asking appropriate questions regarding word-finding strategies (have not observed difficulty during session) and strategies to utilize to maximize thought organization and overall recall. SLP provided education regarding word-finding strategies and strategies to utilize for recall. Handouts were also given to reinforce information. Patient left upright in wheelchair with husband present. Continue with current plan of care.     Pain No/Denies Pain   Therapy/Group: Individual Therapy  [name] 09/01/2022, 3:18 PM

## 2022-09-01 NOTE — Progress Notes (Signed)
Physical Therapy Session Note  Patient Details  Name: [name] MRN: [id number] Date of Birth: [birth year]  Today's Date: 09/01/2022 PT Individual Time: 7412-8786 PT Individual Time Calculation (min): 69 min   Short Term Goals: Week 2:  PT Short Term Goal 1 (Week 2): STGs = LTGs  Skilled Therapeutic Interventions/Progress Updates:     Pt received seated in WC and agrees to therapy. No complaint of pain. WC transport to gym for time management. Pt performs stand pivot transfer to mat with CGA and pt demonstrating decreased safety and eccentric control of stand to sit. PT cues for improved safety. Following, pt copmletes Berg balance test, as detailed below, indicating pt is at high risk for falls. Pt has multiple LOBs while performing toe tapping challenge on Berg, so activity is continued following test to challenge single leg stance, lateral weight shifting, and coordination. Pt able to perform activity with 6 inch step and close supervision, but has frequent LOBs, typically posteriorly or to the L, requiring totalA to prevent fall. Pt tasked with completing x20 alternating toe taps and has LOB on final rep on both attempts, falling backward onto mat with PT guiding hips for safety. Pt tends to look straight down and PT provides demonstration and education on utilizing level gaze to promote improved balance. Pt then able to complete 2x20 reps without losing balance. Pt ambulates x350', primarily with CGA but requiring minA and even modA/maxA at times due to complete LOBs to the L. Pt has staggering gait pattern, routinely shifting weight too quickly into R lower extremity, with decreased stance time on L lower extremity and L lateral weight shift. Toward end of gait distance pt begins to report pain in R hip and has deteriorating gait pattern, eventually collapsing onto mat table with decreased control and safety. Following rest break, pt completes Nustep for 2x5:00 at workload of 6 with average steps  per minute ~50. Completed for strength and endurance training as well as reciprocal coordination.   Pt left seated in WC with all needs within reach.  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 Restrictions Weight Bearing Restrictions: No Balance: Balance Balance Assessed: Yes Standardized Balance Assessment Standardized Balance Assessment: Berg Balance Test Berg Balance Test Sit to Stand: Able to stand without using hands and stabilize independently Standing Unsupported: Able to stand 2 minutes with supervision Sitting with Back Unsupported but Feet Supported on Floor or Stool: Able to sit safely and securely 2 minutes Stand to Sit: Uses backs of legs against chair to control descent Transfers: Able to transfer with verbal cueing and /or supervision Standing Unsupported with Eyes Closed: Able to stand 10 seconds with supervision Standing Ubsupported with Feet Together: Able to place feet together independently but unable to hold for 30 seconds (loses balance when becoming distracted) From Standing, Reach Forward with Outstretched Arm: Can reach confidently >25 cm (10") From Standing Position, Pick up Object from Floor: Able to pick up shoe, needs supervision From Standing Position, Turn to Look Behind Over each Shoulder: Turn sideways only but maintains balance Turn 360 Degrees: Needs assistance while turning Standing Unsupported, Alternately Place Feet on Step/Stool: Needs assistance to keep from falling or unable to try Standing Unsupported, One Foot in Front: Loses balance while stepping or standing Standing on One Leg: Unable to try or needs assist to prevent fall Total Score: 29    Therapy/Group: Individual Therapy  Breck Coons, PT, DPT 09/01/2022, 4:26 PM

## 2022-09-02 NOTE — Progress Notes (Signed)
Physical Therapy Session Note  Patient Details  Name: [name] MRN: [id number] Date of Birth: Feb 05, 1941  Today's Date: 09/02/2022 PT Individual Time: 7416-3845 PT Individual Time Calculation (min): 55 min   Short Term Goals: Week 2:  PT Short Term Goal 1 (Week 2): STGs = LTGs  Skilled Therapeutic Interventions/Progress Updates:     Pt received seated in [facility] and agrees to therapy. No complaint of pain. WC transport to gym for time management. Pt performs sit to stand with cues for initiation. Pt ambulates x175' without AD, with CGA and cues to "land as lightly as possible with R heel" to promote improved L lateral weight shifting and decrease collapsing onto R side. Following seated rest break, pt ambulates 200' to main gym with same cueing and assist levels. PT arranges obstacle course and pt ambulates in-and-out of cones, x30' in each direction. PT demonstrates symmetrical WB between R and L lower extremities with equivalent reciprocal gait pattern. Pt then performs x2 with seated rest break in between. Following rest break, pt performs obstacle course while naming supermarket items with each letter of the alphabet. Pt does not have any LOBs, but has significantly decreased gait speed, often stopping completely, and has downward gaze throughout task. PT provides education on importance of focusing on mobility tasks due to decline in balance and safety when multitasking.  Pt ambulates while tapping R foot on cones to promote increased L lateral weight shifting and loading, x120' total with CGA and cues for posture and sequencing.  Pt ascends up and down 6" steps without use of handrails to challenge balance and strength, with cues for step sequencing and foot placement. Pt has several LOBs, requiring minA/modA for safety, but otherwise completes with CGA.   WC transport back room. Pt left seated with all needs within reach.   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 Restrictions Weight Bearing Restrictions: No  Therapy/Group: Individual Therapy  Breck Coons, PT, DPT 09/02/2022, 5:35 PM

## 2022-09-02 NOTE — Progress Notes (Signed)
Occupational Therapy Session Note  Patient Details  Name: [name] MRN: [id number] Date of Birth: Oct 09, 1941  Today's Date: 09/02/2022 OT Individual Time: 4536-4680 OT Individual Time Calculation (min): 31 min   Short Term Goals: Week 2:  OT Short Term Goal 1 (Week 2): LTG=STG 2/2 ELOS  Skilled Therapeutic Interventions/Progress Updates:    Pt greeted semi-reclined in bed and agreeable to OT treatment session. Pt ambulated to therapy gym without AD and CGA. L UE NMR with SciFit arm bike. 2 minutes forward, 2 back, and 1 minute with only L L UE. Intermittent min A to maintain L grasp on handle. Continued L UE NMR with cup stacking activity with guided A from OT to facilitate normal movement patterns. Pt ambulated back to room with CGA. Pt left semi-reclined in bed with bed alarm on, call bell in reach, and needs met.   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 Restrictions Weight Bearing Restrictions: No  Pain:  Pt reports L knee pain, rest and repositioned for comfort   Therapy/Group: Individual Therapy  Valma Cava 09/02/2022, 2:36 PM

## 2022-09-02 NOTE — Progress Notes (Signed)
Occupational Therapy Weekly Progress Note  Patient Details  Name: [name] MRN: [id number] Date of Birth: [birth year]  Beginning of progress report period: August 23, 2022 End of progress report period: September 02, 2022  Today's Date: 09/02/2022 OT Individual Time: 3491-7915 OT Individual Time Calculation (min): 58 min    Patient has met 4 of 4 short term goals.  Pt is making great progress towards OT goals. Pt is at an overall Min A level for BADL tasks and transfers. R UE is improving with increased volitional movement of flexor synergy pattern. Some functional grasp is also returning. Continue current POC.  Patient continues to demonstrate the following deficits: muscle weakness, impaired timing and sequencing, abnormal tone, unbalanced muscle activation, decreased coordination, and decreased motor planning, decreased problem solving, decreased safety awareness, and decreased memory, and decreased standing balance, decreased postural control, hemiplegia, and decreased balance strategies and therefore will continue to benefit from skilled OT intervention to enhance overall performance with BADL and Reduce care partner burden.  Patient progressing toward long term goals..  Continue plan of care.  OT Short Term Goals Week 1:  OT Short Term Goal 1 (Week 1): Pt will complete toilet transfers wiht LRAD and MIN A OT Short Term Goal 1 - Progress (Week 1): Met OT Short Term Goal 2 (Week 1): Pt will don shirt wiht MOD A OT Short Term Goal 2 - Progress (Week 1): Met OT Short Term Goal 3 (Week 1): Pt will recall hemi strategies wiht no more than min cuing OT Short Term Goal 3 - Progress (Week 1): Met OT Short Term Goal 4 (Week 1): Pt will thread BLE into pants with AE PRN OT Short Term Goal 4 - Progress (Week 1): Met Week 2:  OT Short Term Goal 1 (Week 2): LTG=STG 2/2 ELOS  Skilled Therapeutic Interventions/Progress Updates:    Pt greeted seated EOB with spouse present. Discussed pt  progress and OT goals with pt and spouse. Treatment session focused on family education with Spouse and neuro re-ed. Education provided regarding safe BADL performance in home environment, home modifications, DME needs, energy conservation techniques, and safety awareness.  Pt ambulated to therapy apartment w/ RW and spouse providing CGA. Addressed walk-in shower transfer in simulated home environment. Pt already has all DME. Also practiced furniture transfers with education provided to pt and spouse. Educated on L UE NMR exercises with spouse providing joint input through wrist and elbow. Standing SciFit arm bike for neuro re-ed with improved grasp to hold onto handles. Pt with slight L knee buckle after standing and needed a seated rest break before ambulating back to the room in similar fashion. OT placed SAEBO e-stim on wrist extensors. SAEBO left on for 60 minutes. OT returned to remove SAEBO with skin intact and no adverse reactions.  Sa[place]o Stim One 330 pulse width 35 Hz pulse rate On 8 sec/ off 8 sec Ramp up/ down 2 sec Symmetrical Biphasic wave form  Max intensity 165m at 500 Ohm load  OT provided handout and education regarding home SAEBO unit. Pt left seated in wc with spouse present and needs met.     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 Restrictions Weight Bearing Restrictions: No Pain:  Denies pain   Therapy/Group: Individual Therapy  [name]9/26/2023, 9:08 AM

## 2022-09-02 NOTE — Progress Notes (Addendum)
PROGRESS NOTE   Subjective/Complaints:  No acute events overnight. No complaints. Working with OT on scavenger hunt this AM, communication. Feels LUE improving gradually, asks about long-term recovery prognosis; reinforced to patient this is a several months long process and to continue working hard with therapies. She is in agreement.   ROS: Patient denies fever, rash, sore throat, blurred vision, dizziness, nausea, vomiting, diarrhea, cough, shortness of breath or chest pain, joint or back/neck pain, headache   .   Objective:   No results found. No results for input(s): "WBC", "HGB", "HCT", "PLT" in the last 72 hours. Recent Labs    09/01/22 0923  NA 137  K 3.7  CL 103  CO2 25  GLUCOSE 83  BUN 14  CREATININE 0.80  CALCIUM 9.5     Intake/Output Summary (Last 24 hours) at 09/02/2022 0959 Last data filed at 09/02/2022 0850 Gross per 24 hour  Intake 1072 ml  Output 0 ml  Net 1072 ml         Physical Exam:  Constitutional: No distress . Vital signs reviewed. HEENT: NCAT, EOMI, oral membranes moist Neck: supple Cardiovascular: RRR without murmur. No JVD    Respiratory/Chest: CTA Bilaterally without wheezes or rales. Normal effort    GI/Abdomen: BS +, non-tender, non-distended Ext: no clubbing, cyanosis, or edema. + LUE e-stim Psych:  pleasant, appropriate mood and affect.  Skin: Skin is warm and dry.     Comments: Paper thin with multiple ecchymotic areas LUE>RUE.  - stable MSK: Left knee with crepitus during ROM and chronic changes visible       No apparent deformity.                     LLE 5-/5 throughout compared to RLE  Neuro:     AAOx3. +L facial droop. Mild dysarthria.   Prior exams not repeated today: Strength:                LUE: 3/5 SA, 2/5 EF, 2/5 EE, 1/5 WE, 1/5 FF, tr/5 FA   RUE and RLE 4+ to 5/5        Assessment/Plan: 1. Functional deficits which require 3+ hours per day of  interdisciplinary therapy in a comprehensive inpatient rehab setting. Physiatrist is providing close team supervision and 24 hour management of active medical problems listed below. Physiatrist and rehab team continue to assess barriers to discharge/monitor patient progress toward functional and medical goals  Care Tool:  Bathing    Body parts bathed by patient: Right arm, Chest, Abdomen, Front perineal area, Right upper leg, Left upper leg, Face   Body parts bathed by helper: Left arm, Buttocks, Right lower leg, Left lower leg     Bathing assist Assist Level: Moderate Assistance - Patient 50 - 74%     Upper Body Dressing/Undressing Upper body dressing   What is the patient wearing?: Pull over shirt    Upper body assist Assist Level: Maximal Assistance - Patient 25 - 49%    Lower Body Dressing/Undressing Lower body dressing      What is the patient wearing?: Pants, Underwear/pull up     Lower body assist Assist for lower body  dressing: Maximal Assistance - Patient 25 - 49%     Toileting Toileting    Toileting assist Assist for toileting: Maximal Assistance - Patient 25 - 49%     Transfers Chair/bed transfer  Transfers assist     Chair/bed transfer assist level: Minimal Assistance - Patient > 75%     Locomotion Ambulation   Ambulation assist      Assist level: Minimal Assistance - Patient > 75% Assistive device: Walker-rolling Max distance: 140   Walk 10 feet activity   Assist     Assist level: Minimal Assistance - Patient > 75% Assistive device: Walker-rolling   Walk 50 feet activity   Assist Walk 50 feet with 2 turns activity did not occur: Safety/medical concerns  Assist level: Minimal Assistance - Patient > 75% Assistive device: Walker-rolling    Walk 150 feet activity   Assist Walk 150 feet activity did not occur: Safety/medical concerns         Walk 10 feet on uneven surface  activity   Assist Walk 10 feet on uneven  surfaces activity did not occur: Safety/medical concerns         Wheelchair     Assist Is the patient using a wheelchair?: Yes (Transport to and from therapy gym for energy conservation and time management.) Type of Wheelchair: Manual    Wheelchair assist level: Dependent - Patient 0% Max wheelchair distance: 150    Wheelchair 50 feet with 2 turns activity    Assist        Assist Level: Dependent - Patient 0%   Wheelchair 150 feet activity     Assist      Assist Level: Dependent - Patient 0%   Blood pressure (!) 169/63, pulse 62, temperature 97.7 F (36.5 C), temperature source Oral, resp. rate 15, height 4' 11.5" (1.511 m), weight 51.4 kg, SpO2 98 %.  Medical Problem List and Plan: 1. 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  Antithrombotics: -DVT/anticoagulation:  Pharmaceutical: Lovenox             -antiplatelet therapy: DAPT X 3 weeks followed by ASA alone.  3. Post herpetic neuralgia/Pain Management: Was on gabapentin for left facial and scalp pain.  --resumed gabapentin. - voltaren gel helpful for left knee. Might benefit from knee brace 4. Mood/Behavior/Sleep: LCSW to follow for evaluation and support.              -antipsychotic agents: N/A 5. Neuropsych/cognition: This patient is capable of making decisions on her own behalf. 6. Skin/Wound Care: Routine pressure relief measures.  7. Fluids/Electrolytes/Nutrition: Monitor I/O. Check CMET in am.              - 9/16 - Mild AKI. Encouraging PO fluids     Latest Ref Rng & Units 09/01/2022    9:23 AM 08/25/2022    5:26 AM 08/23/2022    6:12 AM  BMP  Glucose 70 - 99 mg/dL 83  84  79   BUN 8 - 23  mg/dL 14  13  17   Creatinine 0.44 - 1.00 mg/dL 0.80  0.88  1.01   Sodium 135 - 145 mmol/L 137  139  137   Potassium 3.5 - 5.1 mmol/L 3.7  3.6  3.8   Chloride 98 - 111 mmol/L 103  104  102   CO2 22 - 32 mmol/L '25  24  24   '$ Calcium 8.9 - 10.3 mg/dL 9.5  9.4  9.2     -9/23 pushing fluids  -9/25 - stable 8. HTN: Monitor BP TID. Continue inderal and Cozaar.             - Per Neurology notes, permissive up to 220/110, target <200 and gradually decrease over 5-7 days (target date for normal 9/19)            - Hydralazine PRN 10 mg for SBP >220           - 9/16 - significant BP downtrend in last 24 hours; monitor           - 9/17 - remains elevated but sustained <180 SBP; adding Norvasc 5 mg daily. Will take 2-3 days for effect, but cautious to increase Losartan given AKI.             - 9/25 - inc norvasc to 10 mg ; would consider renal US as OP if not already performed    09/02/2022    5:23 AM 09/01/2022    7:36 PM 09/01/2022    1:56 PM  Vitals with BMI  Systolic 161 096 045  Diastolic 63 69 72  Pulse 62 71 80        9. H/o occasional Headaches: Used ASA at home prn. . - resolved --continue Inderal. Resumed tramadol prn.  - DC tramadol, not using 10. H/o anxiety d/o: Continue Cymbalta.  11. Hypokalemia:  resolved --Normokalemic last 2 readings   12. Leucocytosis: Resolved   13. 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 H/o Asthma: Resume Singulair. Albuterol MDI prn as rescue.    15. Dysphagia: Multifactorial due to facial weakness, numbness? difficulty breathing due to sinus surgeries etc             --pt upgraded back to D3 diet which she prefers from a taste standpoint   -tolerating without issue 16. Constipation            - 9/16 - add Senna S 2 tabs QHS + Miralax BID  -9/23 no loose stools x 24 hours   -holding all scheduled soft/lax   -added imodium prn           - Lbm 9/24, large  LOS: 11 days A  FACE TO [place] 09/02/2022, 9:59 AM

## 2022-09-02 NOTE — Plan of Care (Signed)
  Problem: RH Problem Solving Goal: LTG Patient will demonstrate problem solving for (SLP) Description: LTG:  Patient will demonstrate problem solving for basic/complex daily situations with cues  (SLP) Flowsheets (Taken 09/02/2022 1051) LTG Patient will demonstrate problem solving for: Supervision Note: Goal downgraded due to slow progress   Problem: RH Memory Goal: LTG Patient will use memory compensatory aids to (SLP) Description: LTG:  Patient will use memory compensatory aids to recall biographical/new, daily complex information with cues (SLP) Flowsheets (Taken 09/02/2022 1051) LTG: Patient will use memory compensatory aids to (SLP): Supervision Note: Goal downgraded due to slow progress

## 2022-09-02 NOTE — Progress Notes (Signed)
Speech Language Pathology Daily Session Note  Patient Details  Name: [name] MRN: [id number] Date of Birth: 31-Oct-1941  Today's Date: 09/02/2022 SLP Individual Time: 8316-7425 SLP Individual Time Calculation (min): 45 min  Short Term Goals: Week 2: SLP Short Term Goal 1 (Week 2): STGs=LTGs due to ELOS  Skilled Therapeutic Interventions: Skilled treatment session focused on cognitive goals. SLP facilitated session by providing Mod verbal cues for use of memory compensatory strategies to recall 5 locations during a navigation task. Throughout task, Min verbal cues were also needed for functional problem solving for utilization of signs/asking people for navigation. Patient completed task in a timely manner. Patient left upright in wheelchair with husband present. Continue with current plan of care.      Pain No/Denies Pain   Therapy/Group: Individual Therapy  Smt Lokey 09/02/2022, 3:03 PM

## 2022-09-02 NOTE — Patient Care Conference (Signed)
Inpatient RehabilitationTeam Conference and Plan of Care Update Date: 09/02/2022   Time: 10:47 AM    Patient Name: [name]      Medical Record Number: [id number]  Date of Birth: 03-Mar-1941 Sex: Female         Room/Bed: 4M09C/4M09C-01 Payor Info: Payor: [employer] MEDICARE / Plan: [place] HMO / Product Type: *No Product type* /    Admit Date/Time:  08/22/2022  3:31 PM  Primary Diagnosis:  Right thalamic stroke [facility])  Hospital Problems: Principal Problem:   Right thalamic stroke [facility])    Expected Discharge Date: Expected Discharge Date: 09/05/22  Team Members Present: Physician leading conference: Other (comment) (Dr Durel Salts, DO) Social Worker Present: Loralee Pacas, [place] Nurse Present: Other (comment) Tacy Learn, RN) PT Present: Tereasa Coop, PT OT Present: Cherylynn Ridges, OT SLP Present: Weston Anna, SLP PPS Coordinator present : Gunnar Fusi, SLP     Current Status/Progress Goal Weekly Team Focus  Bowel/Bladder   Pt is continent of bowel/bladder  Pt will remain continent of bowel/bladder  Will assess qshift and PRN   Swallow/Nutrition/ Hydration   Dys. 3 textures with thin liquids, Supervision-Mod I  Mod I  tolerance of current diet, use of swallowing compensatory strategies, trials of regular textures   ADL's   CGA overall  Supervision  pt/family education, dc planning, L NMR, NMES   Mobility   supervision bed mobility, CGA transfers, supervision gait with RW, CGA/minA gait without AD  supervision  DC prep, safety awarenss, L NMR, ambulation, stairs   Communication             Safety/Cognition/ Behavioral Observations  Supervision-Min A  Mod I  complex problem solving, recall with use of strategies   Pain   Pt has no complaints of pain  Pt will remain pain free  Will assess qshift and PRN   Skin   Pt's skin is intact  Pt's skin will remain intact  Will assess qshift and PRN     Discharge Planning:  Pt  will d/c to home with her  husband who will provide 24/7 care.   Team Discussion: Right thalamic stroke. Continent B/B. LBM 09/24. Denies pain. Skin intact. Blood pressure remains elevated. Continues to be impulsive.  Patient on target to meet rehab goals: yes, patient progressing towards goals  *See Care Plan and progress notes for long and short-term goals.   Revisions to Treatment Plan:  Medication adjustments, monitor labs  Teaching Needs: Medications, safety, gait/transfer training, etc.  Current Barriers to Discharge: Decreased caregiver support  Possible Resolutions to Barriers: Family education, medication education, order recommended DME     Medical Summary Current Status: stable  Barriers to Discharge: Behavior;Decreased family/caregiver support;Home enviroment access/layout;Medical stability;Medication compliance  Barriers to Discharge Comments: LUE weakness, cognitive/language barriers, BP control, constipation Possible Resolutions to [employer] Focus: Continue titrating up BP medications, working on weaning pain medictions   Continued Need for Acute Rehabilitation Level of Care: The patient requires daily medical management by a physician with specialized training in physical medicine and rehabilitation for the following reasons: Direction of a multidisciplinary physical rehabilitation program to maximize functional independence : Yes Medical management of patient stability for increased activity during participation in an intensive rehabilitation regime.: Yes Analysis of laboratory values and/or radiology reports with any subsequent need for medication adjustment and/or medical intervention. : Yes   I attest that I was present, lead the team conference, and concur with the assessment and plan of the team.   Maebelle Munroe  [place] 09/02/2022, 2:04 PM

## 2022-09-02 NOTE — Progress Notes (Signed)
Patient ID: [name], female   DOB: 03/24/1941, 81 y.o.   MRN: [id number]  SW met with pt and pt husband to provide udpates from team conference, and d/c date remains 9/29. Family edu already began. SW discussed d/c recs: outpatient PT/OT/SLP and DME: 3in1 BSC. Pt preferred outpatient location is [facility]. Pt aware if this location does not offer all three services, will send to [place], MSW, [place] Office: [phone] Cell: [phone] Fax: [phone]

## 2022-09-02 NOTE — Progress Notes (Signed)
Jes took vitals at 1350, but could not scan.

## 2022-09-03 NOTE — Progress Notes (Signed)
Speech Language Pathology Daily Session Note  Patient Details  Name: [name] MRN: [id number] Date of Birth: Feb 25, 1941  Today's Date: 09/03/2022 SLP Individual Time: 3013-1438 SLP Individual Time Calculation (min): 56 min  Short Term Goals: Week 2: SLP Short Term Goal 1 (Week 2): STGs=LTGs due to ELOS  Skilled Therapeutic Interventions: Skilled treatment session focused on dysphagia goals and completion of family education with the patient and her husband. SLP facilitated session by providing skilled observation with trials of regular textures. Patient with slow and deliberate mastication with minimal oral residue noted. No overt s/s of aspiration observed, therefore, recommend patient upgrade to regular textures. SLP also facilitated session by providing education regarding patient's current diet recommendations, appropriate textures, compensatory strategies, and medication administration. Education was also provided regarding patient's current cognitive deficits and strategies to utilize at home to maximize attention, recall, problem solving, safety, and how to maintain a healthy brain lifestyle. SLP also educated on the importance of 24 hour supervision. Handouts were given to reinforce all information. Patient left upright in recliner with family present. Continue with current plan of care.      Pain No/Denies Pain   Therapy/Group: Individual Therapy  Rowin Bayron 09/03/2022, 3:22 PM

## 2022-09-03 NOTE — Progress Notes (Signed)
Physical Therapy Session Note  Patient Details  Name: [name] MRN: [id number] Date of Birth: October 03, 1941  Today's Date: 09/03/2022 PT Individual Time: 1105-1200 PT Individual Time Calculation (min): 55 min   Short Term Goals: Week 2:  PT Short Term Goal 1 (Week 2): STGs = LTGs  Skilled Therapeutic Interventions/Progress Updates:     Pt received seated in WC and agrees to therapy. No complaint of pain. WC transport to gym for time management. Pt completes [employer], as detailed below, with score of 49/56, indicating significant improvement in balance since previous test (29/56). Following Merrilee Jansky, PT explains TUG test to pt and pt performs, initially attempting to run and demonstrating decreased safety awareness. PT educates on importance of safety and that test involves ambulating. On subsequent attempts, pt has improved safety and records following scores: 9.2, 8.6, and 9.4. Pt then performs Cognitive Tug, demonstrating significantly increased postural sway and having to stop several times, but records time of 11.7 seconds. PT educates on importance of maintaining focus on task when mobilizing following discharge and pt verbalizes understanding. WC transport back to room. Pt left seated with all needs within reach.  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 Restrictions Weight Bearing Restrictions: No Balance: Standardized Balance Assessment Standardized Balance Assessment: Berg Balance Test;Timed Up and Go Test Berg Balance Test Sit to Stand: Able to stand without using hands and stabilize independently Standing Unsupported: Able to stand safely 2 minutes Sitting with Back Unsupported but Feet Supported on Floor or Stool: Able to sit safely and securely 2 minutes Stand to Sit: Controls descent by using hands Transfers: Able to transfer safely, definite need of hands Standing Unsupported with Eyes  Closed: Able to stand 10 seconds safely Standing Ubsupported with Feet Together: Able to place feet together independently and stand 1 minute safely From Standing, Reach Forward with Outstretched Arm: Can reach confidently >25 cm (10") From Standing Position, Pick up Object from Floor: Able to pick up shoe, needs supervision From Standing Position, Turn to Look Behind Over each Shoulder: Looks behind from both sides and weight shifts well Turn 360 Degrees: Able to turn 360 degrees safely one side only in 4 seconds or less Standing Unsupported, Alternately Place Feet on Step/Stool: Able to complete 4 steps without aid or supervision Standing Unsupported, One Foot in Front: Able to plae foot ahead of the other independently and hold 30 seconds Standing on One Leg: Able to lift leg independently and hold > 10 seconds Total Score: 49 Timed Up and Go Test TUG: Normal TUG   Therapy/Group: Individual Therapy  Breck Coons, PT, DPT 09/03/2022, 5:00 PM

## 2022-09-03 NOTE — Progress Notes (Signed)
PROGRESS NOTE   Subjective/Complaints:  No acute events overnight. No complaints.   Inquiring about discharging on current medication regimen, and switching PCP to [facility], as she feels comfortable here. Assured we would assist in arranging at discharge a referral to new PCP.    ROS: Patient denies fever, rash, sore throat, blurred vision, dizziness, nausea, vomiting, diarrhea, cough, shortness of breath or chest pain, joint or back/neck pain, headache   .   Objective:   No results found. No results for input(s): "WBC", "HGB", "HCT", "PLT" in the last 72 hours. Recent Labs    09/01/22 0923  NA 137  K 3.7  CL 103  CO2 25  GLUCOSE 83  BUN 14  CREATININE 0.80  CALCIUM 9.5      Intake/Output Summary (Last 24 hours) at 09/03/2022 1014 Last data filed at 09/03/2022 0912 Gross per 24 hour  Intake 1105 ml  Output 0 ml  Net 1105 ml         Physical Exam:  Constitutional: No distress . Vital signs reviewed. HEENT: NCAT, EOMI, oral membranes moist Neck: supple Cardiovascular: RRR without murmur. No JVD    Respiratory/Chest: CTA Bilaterally without wheezes or rales. Normal effort    GI/Abdomen: BS +, non-tender, non-distended Ext: no clubbing, cyanosis, or edema.   Psych:  pleasant, appropriate mood and affect.  Skin: Skin is warm and dry.     Comments: Paper thin with multiple ecchymotic areas LUE>RUE.  - stable Neuro:     AAOx3. +L facial droop. Mild dysarthria.  MSK:      No apparent deformity.               LLE 5-/5 throughout compared to RLE                LUE: 4-/5 SA, 3-/5 EF, 3-/5 EE, 2/5 WE, 3/5 FF, 2/5 FA unequal (pinky > thumb, index)        Assessment/Plan: 1. Functional deficits which require 3+ hours per day of interdisciplinary therapy in a comprehensive inpatient rehab setting. Physiatrist is providing close team supervision and 24 hour management of active medical problems listed  below. Physiatrist and rehab team continue to assess barriers to discharge/monitor patient progress toward functional and medical goals  Care Tool:  Bathing    Body parts bathed by patient: Right arm, Chest, Abdomen, Front perineal area, Right upper leg, Left upper leg, Face   Body parts bathed by helper: Left arm, Buttocks, Right lower leg, Left lower leg     Bathing assist Assist Level: Moderate Assistance - Patient 50 - 74%     Upper Body Dressing/Undressing Upper body dressing   What is the patient wearing?: Pull over shirt    Upper body assist Assist Level: Maximal Assistance - Patient 25 - 49%    Lower Body Dressing/Undressing Lower body dressing      What is the patient wearing?: Pants, Underwear/pull up     Lower body assist Assist for lower body dressing: Maximal Assistance - Patient 25 - 49%     Toileting Toileting    Toileting assist Assist for toileting: Maximal Assistance - Patient 25 - 49%  Transfers Chair/bed transfer  Transfers assist     Chair/bed transfer assist level: Minimal Assistance - Patient > 75%     Locomotion Ambulation   Ambulation assist      Assist level: Minimal Assistance - Patient > 75% Assistive device: Walker-rolling Max distance: 140   Walk 10 feet activity   Assist     Assist level: Minimal Assistance - Patient > 75% Assistive device: Walker-rolling   Walk 50 feet activity   Assist Walk 50 feet with 2 turns activity did not occur: Safety/medical concerns  Assist level: Minimal Assistance - Patient > 75% Assistive device: Walker-rolling    Walk 150 feet activity   Assist Walk 150 feet activity did not occur: Safety/medical concerns         Walk 10 feet on uneven surface  activity   Assist Walk 10 feet on uneven surfaces activity did not occur: Safety/medical concerns         Wheelchair     Assist Is the patient using a wheelchair?: Yes (Transport to and from therapy gym for  energy conservation and time management.) Type of Wheelchair: Manual    Wheelchair assist level: Dependent - Patient 0% Max wheelchair distance: 150    Wheelchair 50 feet with 2 turns activity    Assist        Assist Level: Dependent - Patient 0%   Wheelchair 150 feet activity     Assist      Assist Level: Dependent - Patient 0%   Blood pressure (!) 151/73, pulse 65, temperature (!) 97.5 F (36.4 C), temperature source Oral, resp. rate 15, height 4' 11.5" (1.511 m), weight 51.4 kg, SpO2 97 %.  Medical Problem List and Plan: 1. 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  Antithrombotics: -DVT/anticoagulation:  Pharmaceutical: Lovenox             -antiplatelet therapy: DAPT X 3 weeks followed by ASA alone.  3. Post herpetic neuralgia/Pain Management: Was on gabapentin for left facial and scalp pain.  --resumed gabapentin. - voltaren gel helpful for left knee. Might benefit from knee brace 4. Mood/Behavior/Sleep: LCSW to follow for evaluation and support.              -antipsychotic agents: N/A 5. Neuropsych/cognition: This patient is capable of making decisions on her own behalf. 6. Skin/Wound Care: Routine pressure relief measures.  7. Fluids/Electrolytes/Nutrition: Monitor I/O. Check CMET in am.              - 9/16 - Mild AKI. Encouraging PO fluids     Latest Ref Rng & Units 09/01/2022    9:23 AM 08/25/2022    5:26 AM 08/23/2022    6:12 AM  BMP  Glucose 70 - 99 mg/dL 83  84  79   BUN 8 - 23 mg/dL '14  13  17   '$ Creatinine 0.44 - 1.00 mg/dL 0.80  0.88  1.01   Sodium 135 - 145 mmol/L 137  139  137   Potassium 3.5 -  5.1 mmol/L 3.7  3.6  3.8   Chloride 98 - 111  mmol/L 103  104  102   CO2 22 - 32 mmol/L '25  24  24   '$ Calcium 8.9 - 10.3 mg/dL 9.5  9.4  9.2     -9/23 pushing fluids  -9/25 - stable 8. HTN: Monitor BP TID. Continue inderal and Cozaar.             - Per Neurology notes, permissive up to 220/110, target <200 and gradually decrease over 5-7 days (target date for normal 9/19)            - Hydralazine PRN 10 mg for SBP >220           - 9/16 - significant BP downtrend in last 24 hours; monitor           - 9/17 - remains elevated but sustained <180 SBP; adding Norvasc 5 mg daily. Will take 2-3 days for effect, but cautious to increase Losartan given AKI.            - 9/25 - inc norvasc to 10 mg            - 9/27 - BP overall much improved, WNL yesterday; continue current regimen, no s/s orthostasis    09/03/2022    4:28 AM 09/02/2022    7:44 PM 09/02/2022    2:40 PM  Vitals with BMI  Systolic 465 035 465  Diastolic 73 66 61  Pulse 65 79 79        9. H/o occasional Headaches: Used ASA at home prn. . - resolved --continue Inderal. - DC tramadol, not using 10. H/o anxiety d/o: Continue Cymbalta.  11. Hypokalemia:  resolved --Normokalemic last 2 readings   - Repeat labs 9/28 for medication changes prior to DC  12. Leucocytosis: Resolved   13. 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 H/o Asthma: Resume Singulair. Albuterol MDI prn as rescue.    15. Dysphagia: Multifactorial due to facial weakness, numbness? difficulty breathing due to sinus surgeries etc             --pt upgraded back to D3 diet which she prefers from a taste standpoint   -tolerating without issue 16. Constipation            - 9/16 - add Senna S 2 tabs QHS + Miralax BID  -9/23 no loose stools x 24 hours   -holding all scheduled soft/lax   -added imodium prn           - Lbm 9/24, large   LOS: 12 days A FACE TO [place] 09/03/2022, 10:14 AM

## 2022-09-03 NOTE — Progress Notes (Signed)
Occupational Therapy Session Note  Patient Details  Name: [name] MRN: [id number] Date of Birth: 1941-05-14  Today's Date: 09/03/2022 Session 1 OT Individual Time: 2840-6986 OT Individual Time Calculation (min): 58 min   Session 2 OT Individual Time: 1235-1305 OT Individual Time Calculation (min): 30 min    Short Term Goals: Week 2:  OT Short Term Goal 1 (Week 2): LTG=STG 2/2 ELOS  Skilled Therapeutic Interventions/Progress Updates:  Session 1   Pt greeted seated in wc wanting to blow dry her hair. Pt's spouse had assisted with washing hair. Addressed standing balance/endurance and L UE NMR with weight bearing through L UE while standing to blow dry hair. Dressing tasks completed seated in wc with min cues for hemi-dressing techniques and CGA to pull pants up over hips. Incorporated forced use of L UE throughout dressing tasks. Pt ambulated to therapy gym with CGA and no AD with cues for L shoulder posture when ambulating and maintaining midline. OT placed L UE in SAEBO arm support. Addressed L UE NMR with grasping cup and knocking over pegs using shoulder FF. Progressed to grasping large pegs and just pulling out of peg board with guided A. OT placed SAEBO e-stim on wrist extensors. SAEBO left on for 60 minutes. OT returned to remove SAEBO with skin intact and no adverse reactions.  [place] Stim One 330 pulse width 35 Hz pulse rate On 8 sec/ off 8 sec Ramp up/ down 2 sec Symmetrical Biphasic wave form  Max intensity 162m at 500 Ohm load  Pt ambulated back to room in similar fashion and left seated in wc with call bell in reach needs met.   Session 2 Pt greeted sitting in recliner eating lunch. OT educated on upright position for safe eating. OT lowered legs and placed pillows behind back to get her more upright. Worked on functional use of L UE as a stabilizer to support containers while eating. Pt was then able to grasp pudding bowl and maintain grasp while eating 1/2 of pudding.  Pt brought to therapy gym in wc for time management. L UE NMR with reaching to window and pulling off squigz. Pt ambulated back to room without RW and CGA. Pt left seated in wc with spouse present and needs met.    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 Restrictions Weight Bearing Restrictions: No Pain:  Denies pain   Therapy/Group: Individual Therapy  [name]9/27/2023, 1:20 PM

## 2022-09-03 NOTE — Progress Notes (Signed)
Patient ID: [name], female   DOB: 02-Jun-1941, 81 y.o.   MRN: [id number]  SW ordered 3in1 BSC and TTB with [facility] via parachute.   SW called [facility] and this location only offers PT. Outpatient PT/OT/SLP order will be sent to [place], MSW, [place] Office: [phone] Cell: [phone] Fax: [phone]

## 2022-09-03 NOTE — Progress Notes (Signed)
Recreational Therapy Session Note  Patient Details  Name: [name] MRN: [id number] Date of Birth: 1941/10/26 Today's Date: 09/03/2022  Pain: no c/o Skilled Therapeutic Interventions/Progress Updates: Session focused on pt education in regards to coping/stress management.  Pt education/discussion focused on stress exploration including factors that contribute to stress, factors that protect against stress and potential coping strategies.  Coping strategies included deep breathing & imagery.   Therapy/Group: Individual Therapy Reid Regas 09/03/2022, 11:54 AM

## 2022-09-04 LAB — BASIC METABOLIC PANEL
Anion gap: 13 (ref 5–15)
BUN: 18 mg/dL (ref 8–23)
CO2: 25 mmol/L (ref 22–32)
Calcium: 9.9 mg/dL (ref 8.9–10.3)
Chloride: 101 mmol/L (ref 98–111)
Creatinine, Ser: 0.91 mg/dL (ref 0.44–1.00)
GFR, Estimated: >60 mL/min (ref >60)
Glucose, Bld: 82 mg/dL (ref 70–99)
Potassium: 3.3 mmol/L — ABNORMAL LOW (ref 3.5–5.1)
Sodium: 139 mmol/L (ref 135–145)

## 2022-09-04 LAB — CBC
HCT: 38.4 % (ref 36.0–46.0)
Hemoglobin: 12.2 g/dL (ref 12.0–15.0)
MCH: 28.6 pg (ref 26.0–34.0)
MCHC: 31.8 g/dL (ref 30.0–36.0)
MCV: 90.1 fL (ref 80.0–100.0)
Platelets: 328 10*3/uL (ref 150–400)
RBC: 4.26 MIL/uL (ref 3.87–5.11)
RDW: 15.5 % (ref 11.5–15.5)
WBC: 12.2 10*3/uL — ABNORMAL HIGH (ref 4.0–10.5)
nRBC: 0 % (ref 0.0–0.2)

## 2022-09-04 MED ORDER — POTASSIUM CHLORIDE 20 MEQ PO PACK
20.0000 meq | PACK | Freq: Two times a day (BID) | ORAL | Status: DC
  Administered 2022-09-04 – 2022-09-05 (×3): 20 meq via ORAL
  Filled 2022-09-04 (×3): qty 1

## 2022-09-04 NOTE — Discharge Instructions (Addendum)
Inpatient Rehab Discharge Instructions  [facility]ash[place] C [place] Discharge date and time:    Activities/Precautions/ Functional Status: Activity: no lifting, driving, or strenuous exercise till cleared by MD Diet: cardiac diet Wound Care: none needed   Functional status:  ___ No restrictions     ___ Walk up steps independently _X__ 24/7 supervision/assistance   ___ Walk up steps with assistance ___ Intermittent supervision/assistance  ___ Bathe/dress independently ___ Walk with walker     ___ Bathe/dress with assistance ___ Walk Independently    ___ Shower independently ___ Walk with assistance    __X_ Shower with assistance _X__ No alcohol     ___ Return to work/school ________     COMMUNITY REFERRALS UPON DISCHARGE:     Outpatient: PT     OT    [facility]  Phone:[phone]              Appointment Date/Time:*Please expect follow-up within 7-10 business days to schedule your appointment. If you have not received follow-up, be sure to contact the site directly.*  Medical Equipment/Items Ordered:tub transfer bench                                                  Agency/Supplier:[facility] [phone]     Special Instructions:  STROKE/TIA DISCHARGE INSTRUCTIONS SMOKING Cigarette smoking nearly doubles your risk of having a stroke & is the single most alterable risk factor  If you smoke or have smoked in the last 12 months, you are advised to quit smoking for your health. Most of the excess cardiovascular risk related to smoking disappears within a year of stopping. Ask you doctor about anti-smoking medications [place] Quit Line: 1-800-QUIT NOW Free Smoking Cessation Classes [phone]  CHOLESTEROL Know your levels; limit fat & cholesterol in your diet  Lipid Panel     Component Value Date/Time   CHOL 231 (H) 08/20/2022 0338   TRIG 361 (H) 08/20/2022 0338   HDL 64 08/20/2022 0338   CHOLHDL 3.6 08/20/2022 0338   VLDL 72 (H) 08/20/2022 0338    LDLCALC 95 08/20/2022 0338     Many patients benefit from treatment even if their cholesterol is at goal. Goal: Total Cholesterol (CHOL) less than 160 Goal:  Triglycerides (TRIG) less than 150 Goal:  HDL greater than 40 Goal:  LDL (LDLCALC) less than 100   BLOOD PRESSURE American Stroke Association blood pressure target is less that 120/80 mm/Hg  Your discharge blood pressure is:  BP: (!) 155/74 Monitor your blood pressure Limit your salt and alcohol intake Many individuals will require more than one medication for high blood pressure  DIABETES (A1c is a blood sugar average for last 3 months) Goal HGBA1c is under 7% (HBGA1c is blood sugar average for last 3 months)  Diabetes: No known diagnosis of diabetes    Lab Results  Component Value Date   HGBA1C 5.7 (H) 08/20/2022    Your HGBA1c can be lowered with medications, healthy diet, and exercise. Check your blood sugar as directed by your physician Call your physician if you experience unexplained or low blood sugars.  PHYSICAL ACTIVITY/REHABILITATION Goal is 30 minutes at least 4 days per week  Activity: No driving, Therapies: see below Return to work: N/A Activity decreases your risk of heart attack and stroke  and makes your heart stronger.  It helps control your weight and blood pressure; helps you relax and can improve your mood. Participate in a regular exercise program. Talk with your doctor about the best form of exercise for you (dancing, walking, swimming, cycling).  DIET/WEIGHT Goal is to maintain a healthy weight  Your discharge diet is:  Diet Order             Diet regular Room service appropriate? Yes; Fluid consistency: Thin  Diet effective now                   liquids Your height is:  Height: 4' 11.5" (151.1 cm) Your current weight is: Weight: 51.4 kg Your Body Mass Index (BMI) is:  BMI (Calculated): 22.51 Following the type of diet specifically designed for you will help prevent another stroke. You are  at goal weight    Your goal Body Mass Index (BMI) is 19-24. Healthy food habits can help reduce 3 risk factors for stroke:  High cholesterol, hypertension, and excess weight.  RESOURCES Stroke/Support Group:  Call [phone]   STROKE EDUCATION PROVIDED/REVIEWED AND GIVEN TO PATIENT Stroke warning signs and symptoms How to activate emergency medical system (call 911). Medications prescribed at discharge. Need for follow-up after discharge. Personal risk factors for stroke. Pneumonia vaccine given:  Flu vaccine given:  My questions have been answered, the writing is legible, and I understand these instructions.  I will adhere to these goals & educational materials that have been provided to me after my discharge from the hospital.      My questions have been answered and I understand these instructions. I will adhere to these goals and the provided educational materials after my discharge from the hospital.  Patient/Caregiver Signature _______________________________ Date __________  Clinician Signature _______________________________________ Date __________  Please bring this form and your medication list with you to all your follow-up doctor's appointments.

## 2022-09-04 NOTE — Progress Notes (Signed)
Recreational Therapy Discharge Summary Patient Details  Name: [name] MRN: [id number] Date of Birth: [birth year] Today's Date: 09/04/2022  Comments on progress toward goals: Pt has made great progress during LOS and is ready for discharge home with husband tomorrow at overall supervision level.  TR session focused on activity analysis/modifications, community mobility & stress management/coping.  Pt is excited to return home and to previously enjoyed leisure activities. Reasons for discharge: discharge from hospital  Follow-up: Outpatient  Patient/family agrees with progress made and goals achieved: Yes  Jerine Surles 09/04/2022, 11:06 AM

## 2022-09-04 NOTE — Plan of Care (Signed)
  Problem: RH Swallowing Goal: LTG Patient will consume least restrictive diet using compensatory strategies with assistance (SLP) Description: LTG:  Patient will consume least restrictive diet using compensatory strategies with assistance (SLP) Outcome: Completed/Met Goal: LTG Patient will participate in dysphagia therapy to increase swallow function with assistance (SLP) Description: LTG:  Patient will participate in dysphagia therapy to increase swallow function with assistance (SLP) Outcome: Completed/Met   Problem: RH Problem Solving Goal: LTG Patient will demonstrate problem solving for (SLP) Description: LTG:  Patient will demonstrate problem solving for basic/complex daily situations with cues  (SLP) Outcome: Completed/Met   Problem: RH Memory Goal: LTG Patient will use memory compensatory aids to (SLP) Description: LTG:  Patient will use memory compensatory aids to recall biographical/new, daily complex information with cues (SLP) Outcome: Completed/Met

## 2022-09-04 NOTE — Progress Notes (Signed)
Occupational Therapy Discharge Summary  Patient Details  Name: [name] MRN: [id number] Date of Birth: July 22, 1941  Date of Discharge from OT service:September 04, 2022  Today's Date: 09/04/2022 Session 1 OT Individual Time: 1110-1210 OT Individual Time Calculation (min): 60 min   Session 2 OT Individual Time: 1445-1515 OT Individual Time Calculation (min): 30 min    Session 1 Pt greeted sitting in wc with spouse present. Discussed dc plan, OT goals, pt progress, DME needs, L UE NMR, and SAEBO for home. Pt ambulated to therapy gym without AD and close supervision. OT tested ROM using Goniometer as described in detail below. L UE NMR with SciFit arm bike 5 minutes forward and 5 backwards. Ace wrap used to keep L UE on handle. OT brought pt into supine and L UE secured on Unweighted bar using Ace wrap. 3sets of 10 chest press and triceps press in supine. Intermittent guided A from shoulder with fatigue. Pt ambulated back to room while holding onto Ace Wrap with improved L UE posture during ambulation. OT placed SAEBO e-stim for wrist extension. SAEBO left on for 60 minutes. OT returned to remove SAEBO with skin intact and no adverse reactions.  [place] Stim One 330 pulse width 35 Hz pulse rate On 8 sec/ off 8 sec Ramp up/ down 2 sec Symmetrical Biphasic wave form  Max intensity 143m at 500 Ohm load  Pt left seated in wc with family present.  Session 2 Pt greeted semi-reclined in bed asleep, easy to wake and agreeable to OT treatment session. Pt donned shoes at EOB then ambulated to the wc with supervision. OT brought pt to therapy gym for time management. Worked on functional use of L UE with large peg board. Pt able to pull out large pegs with guided A at shoulder. Theraputty activities completed using extra soft tan theraputty. Focus on flattening putty with weight bearing through palm. Educated on continuing to work on pEngineer, miningand grasp. Pt returned to room and left seated in wc with  needs met and spouse present.   Patient has met 10 of 10 long term goals due to improved activity tolerance, improved balance, postural control, ability to compensate for deficits, functional use of  LEFT upper and LEFT lower extremity, improved attention, and improved awareness.  Patient to discharge at overall Supervision level.  Patient's care partner is independent to provide the necessary physical assistance at discharge for higher level iADL tasks.    Reasons goals not met: n/a  Recommendation:  Patient will benefit from ongoing skilled OT services in outpatient setting to continue to advance functional skills in the area of BADL and functional use of L UE .  Equipment: Tub transfer bench  Reasons for discharge: treatment goals met and discharge from hospital  Patient/family agrees with progress made and goals achieved: Yes  OT Discharge Precautions/Restrictions    Pain Pain Assessment Pain Scale: 0-10 Pain Score: 0-No painDenies pain ADL ADL Eating: Set up Grooming: Setup Where Assessed-Grooming: Standing at sink Upper Body Bathing: Supervision/safety Where Assessed-Upper Body Bathing: Shower Lower Body Bathing: Supervision/safety Where Assessed-Lower Body Bathing: Shower Upper Body Dressing: Supervision/safety Where Assessed-Upper Body Dressing: Wheelchair Lower Body Dressing: Supervision/safety Where Assessed-Lower Body Dressing: Wheelchair Toileting: Supervision/safety Where Assessed-Toileting: TGlass blower/designer Close supervision Toilet Transfer Method: SArts development officer GEnergy manager Close supervision WSocial research officer, governmentMethod: SRadiographer, therapeutic TRadio broadcast assistantPraxis Praxis: Intact Cognition Cognition Arousal/Alertness: Awake/alert Orientation Level: Person;Place;Situation Person: Oriented Place: Oriented  Situation: Oriented Awareness: Impaired Behaviors:  Impulsive Safety/Judgment: Impaired Comments: Safety awareness and impulsivity have improved since eval Brief Interview for Mental Status (BIMS) Repetition of Three Words (First Attempt): 3 Temporal Orientation: Year: Correct Temporal Orientation: Month: Accurate within 5 days Temporal Orientation: Day: Correct Recall: "Sock": Yes, no cue required Recall: "Blue": Yes, no cue required Recall: "Bed": Yes, no cue required BIMS Summary Score: 15 Sensation Sensation Light Touch: Appears Intact Coordination Gross Motor Movements are Fluid and Coordinated: No Fine Motor Movements are Fluid and Coordinated: No Motor  Motor Motor: Hemiplegia;Abnormal tone Motor - Discharge Observations: L hemiparesis UE>LE improved since eval Mobility  Bed Mobility Bed Mobility: Sit to Supine Supine to Sit: Independent with assistive device Sit to Supine: Independent with assistive device Transfers Sit to Stand: Supervision/Verbal cueing Stand to Sit: Supervision/Verbal cueing  Trunk/Postural Assessment  Cervical Assessment Cervical Assessment: Within Functional Limits Thoracic Assessment Thoracic Assessment: Within Functional Limits Lumbar Assessment Lumbar Assessment: Within Functional Limits Postural Control Postural Control: Within Functional Limits  Balance Static Sitting Balance Static Sitting - Level of Assistance: 7: Independent Dynamic Sitting Balance Dynamic Sitting - Level of Assistance: 5: Stand by assistance Static Standing Balance Static Standing - Level of Assistance: 5: Stand by assistance Dynamic Standing Balance Dynamic Standing - Level of Assistance: 5: Stand by assistance Extremity/Trunk Assessment RUE Assessment RUE Assessment: Within Functional Limits LUE Assessment LUE Assessment: Exceptions to [facility] LUE Body System: Neuro Brunstrum levels for arm and hand: Arm;Hand Brunstrum level for arm: Stage IV Movement is deviating from synergy Brunstrum level for hand: Stage  IV Movements deviating from synergies LUE AROM (degrees) LUE Overall AROM Comments: Able to achieve grasp and slight extension of fingers, wrist to neutral. Left Shoulder Flexion: 110 Degrees Left Elbow Flexion: 150   Daneen Schick Chaz Mcglasson 09/04/2022, 4:24 PM

## 2022-09-04 NOTE — Progress Notes (Signed)
PROGRESS NOTE   Subjective/Complaints:  No acute events overnight. 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    ROS: Patient denies fever, rash, sore throat, blurred vision, dizziness, nausea, vomiting, diarrhea, cough, shortness of breath or chest pain, joint or back/neck pain, headache   .   Objective:   No results found. Recent Labs    09/04/22 0631  WBC 12.2*  HGB 12.2  HCT 38.4  PLT 328   Recent Labs    09/04/22 0631  NA 139  K 3.3*  CL 101  CO2 25  GLUCOSE 82  BUN 18  CREATININE 0.91  CALCIUM 9.9      Intake/Output Summary (Last 24 hours) at 09/04/2022 1040 Last data filed at 09/04/2022 0741 Gross per 24 hour  Intake 800 ml  Output --  Net 800 ml         Physical Exam:  Constitutional: No distress . Vital signs reviewed. HEENT: NCAT, EOMI, oral membranes moist Neck: supple Cardiovascular: RRR without murmur. No JVD    Respiratory/Chest: CTA Bilaterally without wheezes or rales. Normal effort    GI/Abdomen: BS +, non-tender, non-distended Ext: no clubbing, cyanosis, or edema.   Psych:  pleasant, appropriate mood and affect.  Skin: Skin is warm and dry. No apparent lesions, C/D/I.     Comments: Paper thin with multiple ecchymotic areas LUE>RUE.  - stable Neuro:     AAOx3. +L facial droop. Mild dysarthria improved MSK:      No apparent deformity.               LLE 5-/5 throughout compared to RLE               LUE: 4-/5 SA, 3-/5 EF, 3-/5 EE, 2/5 WE, 3/5 FF, 2/5 FA unequal (pinky > thumb, index)        Assessment/Plan: 1. Functional deficits which require 3+ hours per day of interdisciplinary therapy in a comprehensive inpatient rehab setting. Physiatrist is providing close team supervision and 24 hour management of active medical problems listed below. Physiatrist and rehab team continue to assess barriers to  discharge/monitor patient progress toward functional and medical goals  Care Tool:  Bathing    Body parts bathed by patient: Right arm, Chest, Abdomen, Front perineal area, Right upper leg, Left upper leg, Face   Body parts bathed by helper: Left arm, Buttocks, Right lower leg, Left lower leg     Bathing assist Assist Level: Moderate Assistance - Patient 50 - 74%     Upper Body Dressing/Undressing Upper body dressing   What is the patient wearing?: Pull over shirt    Upper body assist Assist Level: Maximal Assistance - Patient 25 - 49%    Lower Body Dressing/Undressing Lower body dressing      What is the patient wearing?: Pants, Underwear/pull up     Lower body assist Assist for lower body dressing: Maximal Assistance - Patient 25 - 49%     Toileting Toileting    Toileting assist Assist for toileting: Maximal Assistance - Patient 25 - 49%     Transfers Chair/bed transfer  Transfers assist  Chair/bed transfer assist level: Minimal Assistance - Patient > 75%     Locomotion Ambulation   Ambulation assist      Assist level: Minimal Assistance - Patient > 75% Assistive device: Walker-rolling Max distance: 140   Walk 10 feet activity   Assist     Assist level: Minimal Assistance - Patient > 75% Assistive device: Walker-rolling   Walk 50 feet activity   Assist Walk 50 feet with 2 turns activity did not occur: Safety/medical concerns  Assist level: Minimal Assistance - Patient > 75% Assistive device: Walker-rolling    Walk 150 feet activity   Assist Walk 150 feet activity did not occur: Safety/medical concerns         Walk 10 feet on uneven surface  activity   Assist Walk 10 feet on uneven surfaces activity did not occur: Safety/medical concerns         Wheelchair     Assist Is the patient using a wheelchair?: Yes (Transport to and from therapy gym for energy conservation and time management.) Type of Wheelchair: Manual     Wheelchair assist level: Dependent - Patient 0% Max wheelchair distance: 150    Wheelchair 50 feet with 2 turns activity    Assist        Assist Level: Dependent - Patient 0%   Wheelchair 150 feet activity     Assist      Assist Level: Dependent - Patient 0%   Blood pressure (!) 140/61, pulse 64, temperature (!) 97.4 F (36.3 C), resp. rate 14, height 4' 11.5" (1.511 m), weight 51.4 kg, SpO2 96 %.  Medical Problem List and Plan: 1. 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  Antithrombotics: -DVT/anticoagulation:  Pharmaceutical: Lovenox             -antiplatelet therapy: DAPT X 3 weeks followed by ASA alone.  3. Post herpetic neuralgia/Pain Management: Was on gabapentin for left facial and scalp pain.  --resumed gabapentin. - voltaren gel helpful for left knee. Might benefit from knee brace 4. Mood/Behavior/Sleep: LCSW to follow for evaluation and support.              -antipsychotic agents: N/A 5. Neuropsych/cognition: This patient is capable of making decisions on her own behalf. 6. Skin/Wound Care: Routine pressure relief measures.  7. Fluids/Electrolytes/Nutrition: Monitor I/O. Check CMET in am.              - 9/16 - Mild AKI. Encouraging PO fluids     Latest Ref Rng & Units 09/04/2022    6:31 AM 09/01/2022    9:23 AM 08/25/2022    5:26 AM  BMP  Glucose 70 - 99 mg/dL 82  83  84   BUN 8 - 23 mg/dL '18  14  13   '$ Creatinine 0.44 - 1.00 mg/dL 0.91  0.80  0.88   Sodium 135 - 145 mmol/L 139  137  139   Potassium 3.5 - 5.1 mmol/L 3.3  3.7  3.6   Chloride 98 - 111  mmol/L 101  103  104   CO2 22 - 32 mmol/L '25  25  24   '$ Calcium 8.9 - 10.3 mg/dL 9.9  9.5  9.4      -9/23 pushing fluids  -9/25 - stable  8. HTN: Monitor BP TID. Continue inderal and Cozaar.             - Per Neurology notes, permissive up to 220/110, target <200 and gradually decrease over 5-7 days (target date for normal 9/19)            - Hydralazine PRN 10 mg for SBP >220           - 9/16 - significant BP downtrend in last 24 hours; monitor           - 9/17 - remains elevated but sustained <180 SBP; adding Norvasc 5 mg daily. Will take 2-3 days for effect, but cautious to increase Losartan given AKI.            - 9/25 - inc norvasc to 10 mg            - 9/27 - BP overall much improved, WNL yesterday; continue current regimen, no s/s orthostasis    09/04/2022    4:07 AM 09/03/2022    7:24 PM 09/03/2022    1:21 PM  Vitals with BMI  Systolic 092 330 076  Diastolic 61 65 63  Pulse 64 75 78        9. H/o occasional Headaches: Used ASA at home prn. . - resolved --continue Inderal. - DC tramadol, not using 10. H/o anxiety d/o: Continue Cymbalta.  11. Hypokalemia:  resolved --Normokalemic last 2 readings   - Repeat labs 9/28 for medication changes prior to DC  - 9/28 - K supplementation today for 3.3; recheck in AM prior to DC.  12. Leucocytosis: Resolved   13. 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 H/o Asthma: Resume Singulair. Albuterol MDI prn as rescue.    15. Dysphagia: Multifactorial due to facial weakness, numbness? difficulty breathing due to sinus surgeries etc             --pt upgraded back to D3 diet which she prefers from a taste standpoint   -tolerating without issue 16. Constipation            - 9/16 - add Senna S 2 tabs QHS + Miralax BID  -9/23 no loose stools x 24 hours   -holding all scheduled soft/lax   -added imodium prn           - Lbm 9/27   LOS: 13 days A FACE TO [place] 09/04/2022, 10:40 AM

## 2022-09-04 NOTE — Progress Notes (Signed)
Inpatient Rehabilitation Discharge Medication Review by a Pharmacist  A complete drug regimen review was completed for this patient to identify any potential clinically significant medication issues.  High Risk Drug Classes Is patient taking? Indication by Medication  Antipsychotic No   Anticoagulant No   Antibiotic No   Opioid No   Antiplatelet Yes ASA, Plavix - stroke (last day for plavix is 09/09/22)  Hypoglycemics/insulin No   Vasoactive Medication Yes Propranolol, losartan , amlodipine- HTN  Chemotherapy No   Other Yes Atorvastatin - HLD Gabapentin -left facial and scalp pain.  Synthroid - hypothyroid Trazodone - sleep Singulair - asthma Albuterol MDI- asthma/ prn as rescue  Potassium chloride- supplement Prednisone - RA Cymbalta - anxiety Protonix - GERD Folic acid- supplement     Type of Medication Issue Identified Description of Issue Recommendation(s)  Drug Interaction(s) (clinically significant)     Duplicate Therapy     Allergy     No Medication Administration End Date     Incorrect Dose     Additional Drug Therapy Needed     Significant med changes from prior encounter (inform family/care partners about these prior to discharge).    Other  PTA meds not resumed on hospital admission: Premarin,  Iron, gabapentin (resumed) ,Plaquenil, Oxycodone IR. These PTA meds  were  discontinued on discharge.    Clinically significant medication issues were identified that warrant physician communication and completion of prescribed/recommended actions by midnight of the next day:  No  Name of provider notified for urgent issues identified:   Provider Method of Notification:     Pharmacist comments:   Time spent performing this drug regimen review (minutes):  [place], [place] Clinical Pharmacist 09/05/2022

## 2022-09-04 NOTE — Progress Notes (Signed)
Physical Therapy Session Note  Patient Details  Name: [name] MRN: [id number] Date of Birth: 29-Jun-1941  Today's Date: 09/04/2022 PT Individual Time: 1305-1345   40 min   Short Term Goals: Week 1:  PT Short Term Goal 1 (Week 1): Pt will perform supine <>sit with CGA consistently. PT Short Term Goal 1 - Progress (Week 1): Met PT Short Term Goal 2 (Week 1): Pt will perform sit <> stand transfers with min assist and LRAD consistently. PT Short Term Goal 2 - Progress (Week 1): Met PT Short Term Goal 3 (Week 1): Pt will perform stand pivot transfers with min assist and LRAD consistently. PT Short Term Goal 3 - Progress (Week 1): Met PT Short Term Goal 4 (Week 1): Pt will amb at least 90 ft with min assist and LRAD. PT Short Term Goal 4 - Progress (Week 1): Met Week 2:  PT Short Term Goal 1 (Week 2): STGs = LTGs Week 3:     Skilled Therapeutic Interventions/Progress Updates:   Pt received sitting in recliner and agreeable to PT. Pt performed stand pivot transfer with no AD and supervision assist from PT for safety. Pt transported to entrance of [place]. Gait training over cement sidewalk [id number], [id number] assist with cues for improved step height on the LLE and improved attention to task. Pt returned to room and performed stand pivot transfer to bed with no AD. Sit>supine completed without assist, and left supine in bed with call bell in reach and all needs met.       Therapy Documentation Precautions:  Precautions Precautions: Fall Precaution Comments: L hemi L UE>LE Restrictions Weight Bearing Restrictions: No   Pain: Pain Assessment Pain Scale: 0-10 Pain Score: 0-No pain   Therapy/Group: Individual Therapy  AustLorie Phenix8/2023, 1:13 PM

## 2022-09-04 NOTE — Progress Notes (Signed)
Inpatient Rehabilitation Care Coordinator Discharge Note   Patient Details  Name: LORRIANE DEHART MRN: [id number] Date of Birth: October 07, 1941   Discharge location: D/c to home  Length of Stay: 12 days  Discharge activity level: Supervision  Home/community participation: Limited  Patient response XQ:KSKSHN Literacy - How often do you need to have someone help you when you read instructions, pamphlets, or other written material from your doctor or pharmacy?: Never  Patient response GI:TJLLVD Isolation - How often do you feel lonely or isolated from those around you?: Never  Services provided included: MD, RD, PT, OT, RN, SLP, CM, TR, Pharmacy, SW, Neuropsych  Financial Services:  Charity fundraiser Utilized: [place] Medicare  Choices offered to/list presented to: Yes  Follow-up services arranged:  DME, Outpatient    Outpatient Servicies: [facility] for PT/OT/SLP DME : [place] for TTB    Patient response to transportation need: Is the patient able to respond to transportation needs?: Yes In the past 12 months, has lack of transportation kept you from medical appointments or from getting medications?: No In the past 12 months, has lack of transportation kept you from meetings, work, or from getting things needed for daily living?: No   Comments (or additional information):  Patient/Family verbalized understanding of follow-up arrangements:  Yes  Individual responsible for coordination of the follow-up plan: contact pt or pt husband  Confirmed correct DME delivered: Rana Snare 09/04/2022    Rana Snare

## 2022-09-04 NOTE — Progress Notes (Signed)
Patient ID: [name], female   DOB: 01-17-1941, 81 y.o.   MRN: [id number]  SW faxed outpatient PT/OT/SLP referral to [place] location ([phone]/f:[phone]).  SW updated pt and pt husband on change. SW provided pt with provider list in [facility] system as requested by attending based on pt desire to seek new providers. SW confirmed DME has been delivered to the room.  Loralee Pacas, MSW, [place] Office: [phone] Cell: [phone] Fax: [phone]

## 2022-09-04 NOTE — Plan of Care (Signed)
  Problem: RH Eating Goal: LTG Patient will perform eating w/assist, cues/equip (OT) Description: LTG: Patient will perform eating with assist, with/without cues using equipment (OT) Outcome: Completed/Met   Problem: RH Grooming Goal: LTG Patient will perform grooming w/assist,cues/equip (OT) Description: LTG: Patient will perform grooming with assist, with/without cues using equipment (OT) Outcome: Completed/Met   Problem: RH Bathing Goal: LTG Patient will bathe all body parts with assist levels (OT) Description: LTG: Patient will bathe all body parts with assist levels (OT) Outcome: Completed/Met   Problem: RH Dressing Goal: LTG Patient will perform upper body dressing (OT) Description: LTG Patient will perform upper body dressing with assist, with/without cues (OT). Outcome: Completed/Met Goal: LTG Patient will perform lower body dressing w/assist (OT) Description: LTG: Patient will perform lower body dressing with assist, with/without cues in positioning using equipment (OT) Outcome: Completed/Met   Problem: RH Functional Use of Upper Extremity Goal: LTG Patient will use RT/LT upper extremity as a (OT) Description: LTG: Patient will use right/left upper extremity as a stabilizer/gross assist/diminished/nondominant/dominant level with assist, with/without cues during functional activity (OT) Outcome: Completed/Met   Problem: RH Toilet Transfers Goal: LTG Patient will perform toilet transfers w/assist (OT) Description: LTG: Patient will perform toilet transfers with assist, with/without cues using equipment (OT) Outcome: Completed/Met   Problem: RH Tub/Shower Transfers Goal: LTG Patient will perform tub/shower transfers w/assist (OT) Description: LTG: Patient will perform tub/shower transfers with assist, with/without cues using equipment (OT) Outcome: Completed/Met   Problem: RH Memory Goal: LTG Patient will demonstrate ability for day to day recall/carry over during  activities of daily living with assistance level (OT) Description: LTG:  Patient will demonstrate ability for day to day recall/carry over during activities of daily living with assistance level (OT). Outcome: Completed/Met

## 2022-09-04 NOTE — Progress Notes (Signed)
Physical Therapy Discharge Summary  Patient Details  Name: [name] MRN: [id number] Date of Birth: July 27, 1941  Date of Discharge from PT service:September 04, 2022  Today's Date: 09/04/2022 PT Individual Time: 0903-1000 PT Individual Time Calculation (min): 57 min    Patient has met 9 of 9 long term goals due to improved activity tolerance, improved balance, improved postural control, increased strength, functional use of  left upper extremity and left lower extremity, improved attention, improved awareness, and improved coordination.  Patient to discharge at an ambulatory level Supervision. Patient's care partner is independent to provide the necessary physical and cognitive assistance at discharge.  Reasons goals not met: NA  Recommendation:  Patient will benefit from ongoing skilled PT services in outpatient setting to continue to advance safe functional mobility, address ongoing impairments in balance, L hemibody strength and coordination, endurance, and minimize fall risk.  Equipment: No equipment provided  Reasons for discharge: treatment goals met and discharge from hospital  Patient/family agrees with progress made and goals achieved: Yes  Skilled Therapeutic Interventions: Pt received seated in [facility] and agrees to therapy. During session pt reports pain in L knee, number not provided. PT provides rest breaks as needed to manage pain, as well as education on importance of monitoring pain symptoms in L knee to prevent overuse and decline in safety. WC transport to gym for time management. Pt performs car transfer and ramp navigation with verbal cues for sequencing. Following, pt ambulates x150' with rollator and verbal cues for safe management of AD, as well as utilizing reciprocal gait pattern for optimal mechanics. Pt completes x12 6" steps with R hand rails and verbal cues for step sequencing and safe foot placement.   WC downstairs with rec therapist for community reintegration  training. Pt ambulates around gift shop to provide gait and balance challenge in open environment with obstacles and distractions. PT provides cues to bring pt's attention to potential safety hazards, as well as ensuring that pt is focused on mobility task. Pt ambulates without AD with close supervision/CGA. Following seated rest break, pt ambulates outside with rollator to gait train over unlevel and varying surfaces, with verbal cues to redirect pt attention to task and to increase step height for safety. Pt takes seated rest break after ambulating ~200', then has WC transport back to room. Left seated with all needs within reach.  PT Discharge Precautions/Restrictions Precautions Precautions: Fall Precaution Comments: L hemi L UE>LE Pain Interference Pain Interference Pain Effect on Sleep: 1. Rarely or not at all Pain Interference with Therapy Activities: 3. Frequently Pain Interference with Day-to-Day Activities: 3. Frequently Vision/Perception  Vision - History Ability to See in Adequate Light: 0 Adequate Praxis Praxis: Intact  Cognition Arousal/Alertness: Awake/alert Awareness: Impaired Behaviors: Impulsive Safety/Judgment: Impaired Comments: Safety awareness and impulsivity have improved since eval Sensation Sensation Light Touch: Appears Intact Coordination Gross Motor Movements are Fluid and Coordinated: No Fine Motor Movements are Fluid and Coordinated: No Motor  Motor Motor: Hemiplegia;Abnormal tone Motor - Discharge Observations: L hemiparesis UE>LE improved since eval  Mobility Bed Mobility Bed Mobility: Sit to Supine Supine to Sit: Independent with assistive device Sit to Supine: Independent with assistive device Transfers Transfers: Sit to Stand;Stand to Sit;Stand Pivot Transfers Sit to Stand: Supervision/Verbal cueing Stand to Sit: Supervision/Verbal cueing Stand Pivot Transfers: Supervision/Verbal cueing Locomotion  Gait Ambulation: Yes Gait Assistance:  Supervision/Verbal cueing Gait Distance (Feet): 150 Feet Assistive device: Rollator Gait Assistance Details: Verbal cues for safe use of DME/AE;Verbal cues for technique;Verbal cues for  gait pattern Gait Gait: Yes Gait Pattern: Impaired Gait Pattern: Decreased step length - left;Decreased stance time - left Gait velocity: Decreased Stairs / Additional Locomotion Stairs: Yes Stairs Assistance: Supervision/Verbal cueing Stair Management Technique: One rail Right Number of Stairs: 12 Height of Stairs: 6 Ramp: Supervision/Verbal cueing Curb: Supervision/Verbal cueing Wheelchair Mobility Wheelchair Mobility: No  Trunk/Postural Assessment  Cervical Assessment Cervical Assessment: Within Functional Limits Thoracic Assessment Thoracic Assessment: Within Functional Limits Lumbar Assessment Lumbar Assessment: Within Functional Limits Postural Control Postural Control: Within Functional Limits  Balance Static Sitting Balance Static Sitting - Level of Assistance: 7: Independent Dynamic Sitting Balance Dynamic Sitting - Level of Assistance: 5: Stand by assistance Static Standing Balance Static Standing - Level of Assistance: 5: Stand by assistance Dynamic Standing Balance Dynamic Standing - Level of Assistance: 5: Stand by assistance Extremity Assessment  RUE Assessment RUE Assessment: Within Functional Limits LUE Assessment LUE Assessment: Exceptions to [facility] LUE Body System: Neuro Brunstrum levels for arm and hand: Arm;Hand Brunstrum level for arm: Stage IV Movement is deviating from synergy Brunstrum level for hand: Stage IV Movements deviating from synergies LUE AROM (degrees) LUE Overall AROM Comments: Able to achieve grasp and slight extension of fingers, wrist to neutral. Left Shoulder Flexion: 110 Degrees Left Elbow Flexion: 150 RLE Assessment RLE Assessment: Within Functional Limits LLE Strength Left Hip Flexion: 4/5 Left Hip Extension: 5/5 Left Hip ABduction:  5/5 Left Hip ADduction: 5/5 Left Knee Flexion: 5/5 Left Ankle Dorsiflexion: 5/5 Left Ankle Plantar Flexion: 5/5   Breck Coons, PT, DPT 09/04/2022, 12:22 PM

## 2022-09-04 NOTE — Progress Notes (Signed)
Recreational Therapy Session Note  Patient Details  Name: [name] MRN: [id number] Date of Birth: August 26, 1941 Today's Date: 09/04/2022  Pain: c/o knee pain during mobility, rest breaks provided relief  Goal:  Pt will ambulate throughout simulated community setting with min assist or less.  MET  Session focused on safe community mobility & safety awareness during co-treat with PT.  Pt ambulated throughout the hospital gift shop with contact guard assist.  Pt did require Min assist x3 for LOB due to impulsivity as well as verbal cues for visual scanning and attention to the left.  Pt also ambulated outside on slightly uneven surfaces with contact guard assist.  Discussed safety concerns & recognizing need for rest as pt is impulsive by nature.   Colene Mines 09/04/2022, 10:43 AM

## 2022-09-04 NOTE — Progress Notes (Signed)
Speech Language Pathology Discharge Summary  Patient Details  Name: [name] MRN: [id number] Date of Birth: December 02, 1941  Date of Discharge from SLP service:September 04, 2022  Today's Date: 09/04/2022 SLP Individual Time: 4492-0100 SLP Individual Time Calculation (min): 43 min   Skilled Therapeutic Interventions:  Skilled treatment session focused on cognitive goals. SLP facilitated session by re-administering the Cognistat. Patient scored WFL on all subtests and demonstrated improve in attention, visual construction, and short-term recall. SLP also reinforced education regarding importance of staying cognitively engages/active with functional tasks at discharge. Patient left upright in wheelchair with husband present.   Patient has met 4 of 4 long term goals.  Patient to discharge at overall Supervision level.   Reasons goals not met: N/A   Clinical Impression/Discharge Summary: Patient has made excellent gains and has met 4 of 4 LTGs this admission. Currently, patient is consuming regular textures with thin liquids with minimal overt s/s of aspiration and is overall Mod I for use of swallowing compensatory strategies. Patient also demonstrates improved cognitive functioning and requires overall supervision level verbal cues for functional problem solving and recall with use of strategies in order to complete mildly complex tasks. Patient and family education is complete and patient will discharge home with 24 hour supervision from family. Patient would benefit from f/u SLP services to maximize her cognitive functioning and overall functional independence.   Care Partner:  Caregiver Able to Provide Assistance: Yes  Type of Caregiver Assistance: Physical;Cognitive  Recommendation:  Outpatient SLP;24 hour supervision/assistance  Rationale for SLP Follow Up: Reduce caregiver burden;Maximize cognitive function and independence   Equipment: N/A   Reasons for discharge: Discharged from  hospital;Treatment goals met   Patient/Family Agrees with Progress Made and Goals Achieved: Yes    Arno Cullers, [place] 09/04/2022, 6:13 AM

## 2022-09-05 LAB — BASIC METABOLIC PANEL
Anion gap: 6 (ref 5–15)
BUN: 15 mg/dL (ref 8–23)
CO2: 28 mmol/L (ref 22–32)
Calcium: 9.7 mg/dL (ref 8.9–10.3)
Chloride: 104 mmol/L (ref 98–111)
Creatinine, Ser: 0.91 mg/dL (ref 0.44–1.00)
GFR, Estimated: >60 mL/min (ref >60)
Glucose, Bld: 93 mg/dL (ref 70–99)
Potassium: 3.6 mmol/L (ref 3.5–5.1)
Sodium: 138 mmol/L (ref 135–145)

## 2022-09-05 MED ORDER — DULOXETINE HCL 30 MG PO CPEP: 30.0000 mg | ORAL_CAPSULE | Freq: Every day | ORAL | 0 refills | Status: DC

## 2022-09-05 MED ORDER — FOLIC ACID 800 MCG PO TABS: 800.0000 ug | ORAL_TABLET | Freq: Every day | ORAL | 0 refills | Status: DC

## 2022-09-05 MED ORDER — DICLOFENAC SODIUM 1 % EX GEL: 2.0000 g | Freq: Three times a day (TID) | CUTANEOUS | 0 refills | Status: DC

## 2022-09-05 MED ORDER — POTASSIUM CHLORIDE CRYS ER 10 MEQ PO TBCR: 20.0000 meq | EXTENDED_RELEASE_TABLET | Freq: Two times a day (BID) | ORAL | 0 refills | Status: DC

## 2022-09-05 MED ORDER — PROPRANOLOL HCL ER 60 MG PO CP24: 60.0000 mg | ORAL_CAPSULE | Freq: Every day | ORAL | 0 refills | Status: DC

## 2022-09-05 MED ORDER — PREDNISONE 10 MG PO TABS: 10.0000 mg | ORAL_TABLET | Freq: Every day | ORAL | 0 refills | Status: DC

## 2022-09-05 MED ORDER — LEVOTHYROXINE SODIUM 50 MCG PO TABS: 50.0000 ug | ORAL_TABLET | Freq: Every day | ORAL | 0 refills | Status: DC

## 2022-09-05 MED ORDER — ASPIRIN 81 MG PO TBEC: 81.0000 mg | DELAYED_RELEASE_TABLET | Freq: Every day | ORAL | 0 refills | Status: DC

## 2022-09-05 MED ORDER — CLOPIDOGREL BISULFATE 75 MG PO TABS: 75.0000 mg | ORAL_TABLET | Freq: Every day | ORAL | 0 refills | Status: DC

## 2022-09-05 MED ORDER — ATORVASTATIN CALCIUM 40 MG PO TABS: 40.0000 mg | ORAL_TABLET | Freq: Every day | ORAL | 0 refills | Status: DC

## 2022-09-05 MED ORDER — MONTELUKAST SODIUM 10 MG PO TABS: 10.0000 mg | ORAL_TABLET | Freq: Every day | ORAL | 0 refills | Status: DC

## 2022-09-05 MED ORDER — AMLODIPINE BESYLATE 10 MG PO TABS: 10.0000 mg | ORAL_TABLET | Freq: Every day | ORAL | 0 refills | Status: DC

## 2022-09-05 MED ORDER — LOSARTAN POTASSIUM 50 MG PO TABS: 50.0000 mg | ORAL_TABLET | Freq: Every day | ORAL | 0 refills | Status: DC

## 2022-09-05 MED ORDER — GABAPENTIN 300 MG PO CAPS: 300.0000 mg | ORAL_CAPSULE | Freq: Three times a day (TID) | ORAL | 0 refills | Status: DC

## 2022-09-05 MED ORDER — PANTOPRAZOLE SODIUM 40 MG PO TBEC: 40.0000 mg | DELAYED_RELEASE_TABLET | ORAL | 0 refills | Status: DC

## 2022-09-05 NOTE — Progress Notes (Signed)
PROGRESS NOTE   Subjective/Complaints:  No acute events overnight. Feeling well for discharge today. Did discuss mild increase in WBC yesterday, patient denies any fevers, chills, nausea, cough, congestion, SOB, diarrhea, dysuria or urinary urgency. Asked patient to monitor for this, which she is agreeable.    ROS: Patient denies fever, rash, sore throat, blurred vision, dizziness, nausea, vomiting, diarrhea, cough, shortness of breath or chest pain, joint or back/neck pain, headache   .   Objective:   No results found. Recent Labs    09/04/22 0631  WBC 12.2*  HGB 12.2  HCT 38.4  PLT 328    Recent Labs    09/04/22 0631 09/05/22 0657  NA 139 138  K 3.3* 3.6  CL 101 104  CO2 25 28  GLUCOSE 82 93  BUN 18 15  CREATININE 0.91 0.91  CALCIUM 9.9 9.7      Intake/Output Summary (Last 24 hours) at 09/05/2022 1032 Last data filed at 09/05/2022 0700 Gross per 24 hour  Intake 893 ml  Output --  Net 893 ml         Physical Exam:  Constitutional: No distress . Vital signs reviewed. HEENT: NCAT, EOMI, oral membranes moist Neck: supple Cardiovascular: RRR without murmur. No JVD    Respiratory/Chest: CTA Bilaterally without wheezes or rales. Normal effort    GI/Abdomen: BS +, non-tender, non-distended Ext: no clubbing, cyanosis, or edema.   Psych:  pleasant, appropriate mood and affect.  Skin: Skin is warm and dry. No apparent lesions, C/D/I.     Comments: Paper thin with multiple ecchymotic areas LUE>RUE.  - stable Neuro:     AAOx3. +L facial droop. Mild dysarthria improved MSK:      No apparent deformity.               LLE 5-/5 throughout compared to RLE               LUE: 4-/5 SA, 3+/5 EF, 3+/5 EE, 3-/5 WE, 4-/5 FF, 3-/5 FA unequal (pinky > thumb, index)        Assessment/Plan: 1. Functional deficits which require 3+ hours per day of interdisciplinary therapy in a comprehensive inpatient rehab  setting. Physiatrist is providing close team supervision and 24 hour management of active medical problems listed below. Physiatrist and rehab team continue to assess barriers to discharge/monitor patient progress toward functional and medical goals  Care Tool:  Bathing    Body parts bathed by patient: Right arm, Chest, Abdomen, Front perineal area, Right upper leg, Left upper leg, Face, Left lower leg, Right lower leg, Buttocks, Left arm   Body parts bathed by helper: Left arm, Buttocks, Right lower leg, Left lower leg     Bathing assist Assist Level: Supervision/Verbal cueing     Upper Body Dressing/Undressing Upper body dressing   What is the patient wearing?: Pull over shirt    Upper body assist Assist Level: Supervision/Verbal cueing    Lower Body Dressing/Undressing Lower body dressing      What is the patient wearing?: Pants, Underwear/pull up     Lower body assist Assist for lower body dressing: Supervision/Verbal cueing     Toileting Toileting  Toileting assist Assist for toileting: Supervision/Verbal cueing     Transfers Chair/bed transfer  Transfers assist     Chair/bed transfer assist level: Minimal Assistance - Patient > 75%     Locomotion Ambulation   Ambulation assist      Assist level: Minimal Assistance - Patient > 75% Assistive device: Walker-rolling Max distance: 140   Walk 10 feet activity   Assist     Assist level: Minimal Assistance - Patient > 75% Assistive device: Walker-rolling   Walk 50 feet activity   Assist Walk 50 feet with 2 turns activity did not occur: Safety/medical concerns  Assist level: Minimal Assistance - Patient > 75% Assistive device: Walker-rolling    Walk 150 feet activity   Assist Walk 150 feet activity did not occur: Safety/medical concerns         Walk 10 feet on uneven surface  activity   Assist Walk 10 feet on uneven surfaces activity did not occur: Safety/medical concerns          Wheelchair     Assist Is the patient using a wheelchair?: Yes (Transport to and from therapy gym for energy conservation and time management.) Type of Wheelchair: Manual    Wheelchair assist level: Dependent - Patient 0% Max wheelchair distance: 150    Wheelchair 50 feet with 2 turns activity    Assist        Assist Level: Dependent - Patient 0%   Wheelchair 150 feet activity     Assist      Assist Level: Dependent - Patient 0%   Blood pressure (!) 155/74, pulse 65, temperature (!) 97.5 F (36.4 C), temperature source Oral, resp. rate 14, height 4' 11.5" (1.511 m), weight 51.4 kg, SpO2 100 %.  Medical Problem List and Plan: 1. 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  Antithrombotics: -DVT/anticoagulation:  Pharmaceutical: Lovenox             -antiplatelet therapy: DAPT X 3 weeks followed by ASA alone.  3. Post herpetic neuralgia/Pain Management: Was on gabapentin for left facial and scalp pain.  --resumed gabapentin. - voltaren gel helpful for left knee. Might benefit from knee brace 4. Mood/Behavior/Sleep: LCSW to follow for evaluation and support.              -antipsychotic agents: N/A 5. Neuropsych/cognition: This patient is capable of making decisions on her own behalf. 6. Skin/Wound Care: Routine pressure relief measures.  7. Fluids/Electrolytes/Nutrition: Monitor I/O. Check CMET in am.              - 9/16 - Mild AKI. Encouraging PO fluids     Latest Ref Rng & Units 09/05/2022    6:57 AM 09/04/2022    6:31 AM 09/01/2022    9:23 AM  BMP  Glucose 70 - 99 mg/dL 93  82  83   BUN 8 - 23 mg/dL '15  18  14   '$ Creatinine 0.44 - 1.00 mg/dL 0.91  0.91  0.80   Sodium 135 - 145 mmol/L 138  139  137   Potassium 3.5 - 5.1 mmol/L 3.6   3.3  3.7   Chloride 98 -  111 mmol/L 104  101  103   CO2 22 - 32 mmol/L '28  25  25   '$ Calcium 8.9 - 10.3 mg/dL 9.7  9.9  9.5     -9/23 pushing fluids  -9/25 - stable  8. HTN: Monitor BP TID. Continue inderal and Cozaar.             - Per Neurology notes, permissive up to 220/110, target <200 and gradually decrease over 5-7 days (target date for normal 9/19)            - Hydralazine PRN 10 mg for SBP >220           - 9/16 - significant BP downtrend in last 24 hours; monitor           - 9/17 - remains elevated but sustained <180 SBP; adding Norvasc 5 mg daily. Will take 2-3 days for effect, but cautious to increase Losartan given AKI.            - 9/25 - inc norvasc to 10 mg            - 9/27 - BP overall much improved, continue current regimen, no s/s orthostasis    09/05/2022    4:18 AM 09/04/2022    7:40 PM 09/04/2022    3:29 PM  Vitals with BMI  Systolic [phone]  Diastolic 74 68 64  Pulse 65 72 73        9. H/o occasional Headaches: Used ASA at home prn. . - resolved --continue Inderal. - DC tramadol, not using 10. H/o anxiety d/o: Continue Cymbalta.  11. Hypokalemia:  resolved --Normokalemic last 2 readings   - Repeat labs 9/28 for medication changes prior to DC  - 9/28 - K supplementation today for 3.3; recheck in AM prior to DC. -> WNL 929 12. Leucocytosis: Resolved   13. 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 H/o Asthma: Resume Singulair. Albuterol MDI prn as rescue.    15. Dysphagia: Multifactorial due to facial weakness, numbness? difficulty breathing due to sinus surgeries etc             --pt upgraded back to D3 diet which she prefers from a taste standpoint   -tolerating without issue 16. Constipation            - 9/16 - add Senna S 2 tabs QHS + Miralax BID  -9/23 no loose stools x 24 hours   -holding all scheduled soft/lax   -added imodium prn           - Lbm 9/27   LOS: 14  days A FACE TO [place] 09/05/2022, 10:32 AM

## 2022-09-05 NOTE — Discharge Summary (Signed)
Physician Discharge Summary  Patient ID: LEAIRA FULLAM MRN: [id number] DOB/AGE: May 31, 1941 81 y.o.  Admit date: 08/22/2022 Discharge date: 09/05/2022  Discharge Diagnoses:  Principal Problem:   Right thalamic stroke [facility]) Active Problems:   Essential hypertension   Hypokalemia   Leukocytosis   Discharged Condition: stable  Significant Diagnostic Studies: ECHOCARDIOGRAM COMPLETE  Result Date: 08/20/2022    ECHOCARDIOGRAM REPORT   Patient Name:   MYLINH CRAGG Date of Exam: 08/20/2022 Medical Rec #:  [id number]  Height:       59.0 in Accession #:    [id number] Weight:       117.0 lb Date of Birth:  December 13, 1940 BSA:          1.469 m Patient Age:    43 years   BP:           179/97 mmHg Patient Gender: F          HR:           71 bpm. Exam Location:  Inpatient Procedure: 2D Echo, Cardiac Doppler and Color Doppler Indications:    Stroke  History:        Patient has no prior history of Echocardiogram examinations.                 Stroke, Signs/Symptoms:Shortness of Breath; Risk                 Factors:Hypertension.  Sonographer:    Memory Argue Referring Phys: Altamease Oiler DE LA Cherry  1. Left ventricular ejection fraction, by estimation, is 55 to 60%. The left ventricle has normal function. The left ventricle has no regional wall motion abnormalities. There is mild left ventricular hypertrophy. Left ventricular diastolic parameters are consistent with Grade I diastolic dysfunction (impaired relaxation).  2. Right ventricular systolic function is normal. The right ventricular size is normal. Tricuspid regurgitation signal is inadequate for assessing PA pressure.  3. The mitral valve is normal in structure. No evidence of mitral valve regurgitation. No evidence of mitral stenosis.  4. The aortic valve is tricuspid. There is mild calcification of the aortic valve. Aortic valve regurgitation is not visualized. No aortic stenosis is present.  5. The inferior vena cava is normal in size with greater than  50% respiratory variability, suggesting right atrial pressure of 3 mmHg. FINDINGS  Left Ventricle: Left ventricular ejection fraction, by estimation, is 55 to 60%. The left ventricle has normal function. The left ventricle has no regional wall motion abnormalities. The left ventricular internal cavity size was normal in size. There is  mild left ventricular hypertrophy. Left ventricular diastolic parameters are consistent with Grade I diastolic dysfunction (impaired relaxation). Right Ventricle: The right ventricular size is normal. No increase in right ventricular wall thickness. Right ventricular systolic function is normal. Tricuspid regurgitation signal is inadequate for assessing PA pressure. Left Atrium: Left atrial size was normal in size. Right Atrium: Right atrial size was normal in size. Pericardium: There is no evidence of pericardial effusion. Mitral Valve: The mitral valve is normal in structure. No evidence of mitral valve regurgitation. No evidence of mitral valve stenosis. Tricuspid Valve: The tricuspid valve is normal in structure. Tricuspid valve regurgitation is not demonstrated. Aortic Valve: The aortic valve is tricuspid. There is mild calcification of the aortic valve. Aortic valve regurgitation is not visualized. No aortic stenosis is present. Aortic valve mean gradient measures 3.0 mmHg. Aortic valve peak gradient measures 5.6 mmHg. Aortic valve area, by VTI measures 2.07 cm. Pulmonic Valve:  The pulmonic valve was normal in structure. Pulmonic valve regurgitation is not visualized. Aorta: The aortic root is normal in size and structure. Venous: The inferior vena cava is normal in size with greater than 50% respiratory variability, suggesting right atrial pressure of 3 mmHg. IAS/Shunts: No atrial level shunt detected by color flow Doppler.  LEFT VENTRICLE PLAX 2D LVIDd:         4.10 cm   Diastology LVIDs:         2.70 cm   LV e' medial:    4.68 cm/s LV PW:         1.00 cm   LV E/e' medial:   13.1 LV IVS:        1.20 cm   LV e' lateral:   4.13 cm/s LVOT diam:     2.00 cm   LV E/e' lateral: 14.8 LV SV:         41 LV SV Index:   28 LVOT Area:     3.14 cm  RIGHT VENTRICLE TAPSE (M-mode): 2.0 cm LEFT ATRIUM             Index        RIGHT ATRIUM           Index LA diam:        2.00 cm 1.36 cm/m   RA Area:     10.50 cm LA Vol (A2C):   45.4 ml 30.91 ml/m  RA Volume:   19.10 ml  13.01 ml/m LA Vol (A4C):   43.5 ml 29.62 ml/m LA Biplane Vol: 44.7 ml 30.44 ml/m  AORTIC VALVE AV Area (Vmax):    2.07 cm AV Area (Vmean):   2.07 cm AV Area (VTI):     2.07 cm AV Vmax:           118.00 cm/s AV Vmean:          75.600 cm/s AV VTI:            0.199 m AV Peak Grad:      5.6 mmHg AV Mean Grad:      3.0 mmHg LVOT Vmax:         77.90 cm/s LVOT Vmean:        49.900 cm/s LVOT VTI:          0.131 m LVOT/AV VTI ratio: 0.66  AORTA Ao Root diam: 2.70 cm Ao Asc diam:  2.60 cm MITRAL VALVE MV Area (PHT): 5.02 cm    SHUNTS MV Decel Time: 151 msec    Systemic VTI:  0.13 m MV E velocity: 61.20 cm/s  Systemic Diam: 2.00 cm MV A velocity: 99.90 cm/s MV E/A ratio:  0.61 Dalton McleanMD Electronically signed by Franki Monte Signature Date/Time: 08/20/2022/2:33:55 PM    Final    MR BRAIN WO CONTRAST  Result Date: 08/19/2022 CLINICAL DATA:  Stroke follow-up. EXAM: MRI HEAD WITHOUT CONTRAST TECHNIQUE: Multiplanar, multiecho pulse sequences of the brain and surrounding structures were obtained without intravenous contrast. COMPARISON:  CT angio neck 08/19/2022 FINDINGS: Brain: Acute infarct in the right corona radiata. Small acute infarct in the right anterior thalamus. Ventricle size normal. Patchy white matter hyperintensity bilaterally compatible with chronic microvascular ischemia. Chronic infarcts in the thalamus bilaterally. Chronic infarcts in the lateral basal ganglia bilaterally. Negative for hemorrhage or mass. Image quality degraded by motion Vascular: Normal arterial flow voids. Skull and upper cervical spine: No  focal skeletal lesion. C1-2 arthropathy with pannus. Sinuses/Orbits: Paranasal sinuses clear. Bilateral cataract extraction Other: None  IMPRESSION: Acute infarct in the right corona radiata. Small acute infarct in the right anterior thalamus Chronic microvascular ischemic change in the white matter and thalamus bilaterally. Chronic infarcts in the basal ganglia bilaterally. Electronically Signed   By: Franchot Gallo M.D.   On: 08/19/2022 18:57   EEG adult  Result Date: 08/19/2022 Lora Havens, MD     08/19/2022  3:06 PM Patient Name: MADICYN MESINA MRN: [id number] Epilepsy Attending: Lora Havens Referring Physician/Provider: Katy Apo, NP Date: 08/19/2022 Duration: 28.13 mins Patient history: 81 y.o. female arriving to [facility]  via [facility] on 08/19/22 with past medical hx of HTN, hypothyroidism, anxiety, seizure, shingles (with chronic pain). Pt became lethargic with left sided weakness, facial droop, and slurred speech. EEG to evaluate for seizure Level of alertness: Awake AEDs during EEG study: None Technical aspects: This EEG study was done with scalp electrodes positioned according to the 10-20 International system of electrode placement. Electrical activity was reviewed with band pass filter of 1-'70Hz'$ , sensitivity of 7 uV/mm, display speed of 81m/sec with a '60Hz'$  notched filter applied as appropriate. EEG data were recorded continuously and digitally stored.  Video monitoring was available and reviewed as appropriate. Description: The posterior dominant rhythm consists of '8Hz'$  activity of moderate voltage (25-35 uV) seen predominantly in posterior head regions, asymmetric ( right<left) and reactive to eye opening and eye closing. EEG showed continuous 3 to 6 Hz theta-delta slowing in right hemisphere. Hyperventilation and photic stimulation were not performed.   ABNORMALITY - Continuous slow, right hemisphere - Background asymmetry, right<left IMPRESSION: This study is suggestive of  cortical dysfunction arising from right hemisphere  likely secondary to underlying structural abnormality. No seizures or epileptiform discharges were seen throughout the recording. Priyanka OBarbra Sarks  CT ANGIO HEAD NECK W WO CM (CODE STROKE)  Result Date: 08/19/2022 EXAM: CT ANGIOGRAPHY HEAD AND NECK TECHNIQUE: Multidetector CT imaging of the head and neck was performed using the standard protocol during bolus administration of intravenous contrast. Multiplanar CT image reconstructions and MIPs were obtained to evaluate the vascular anatomy. Carotid stenosis measurements (when applicable) are obtained utilizing NASCET criteria, using the distal internal carotid diameter as the denominator. RADIATION DOSE REDUCTION: This exam was performed according to the departmental dose-optimization program which includes automated exposure control, adjustment of the mA and/or kV according to patient size and/or use of iterative reconstruction technique. CONTRAST:  763mOMNIPAQUE IOHEXOL 350 MG/ML SOLN COMPARISON:  None Available. FINDINGS: CTA NECK FINDINGS Aortic arch: Great vessel origins are patent without significant stenosis. Right carotid system: No evidence of dissection, stenosis (50% or greater), or occlusion. Left carotid system: No evidence of dissection, stenosis (50% or greater), or occlusion. Vertebral arteries: Codominant. No evidence of dissection, stenosis (50% or greater), or occlusion. Mild narrowing of right vertebral artery at the craniocervical junction due to mass effect from adjacent degenerative bony hypertrophy. Skeleton: Severe Rosai change at the craniocervical junction with rest change involving the dens and posterior calcified pannus posterior to the dens. No acute fracture. Other neck: No acute findings Upper chest: Biapical pleuroparenchymal scarring. Review of the MIP images confirms the above findings CTA HEAD FINDINGS Anterior circulation: Calcific atherosclerosis of the intracranial ICAs  with mild narrowing bilaterally. Bilateral MCAs and ACAs are patent without proximal hemodynamically significant stenosis. Posterior circulation: Bilateral intradural vertebral arteries, basilar artery and bilateral posterior cerebral arteries are patent. Severe proximal P2 PCA stenosis. Venous sinuses: Not well evaluated due to arterial timing. Review of the  MIP images confirms the above findings IMPRESSION: 1. No emergent large vessel occlusion. 2. Severe proximal left P2 PCA stenosis. 3. 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 Preliminary imaging results were communicated on 08/19/2022 at 12: 54 PM to provider Dr. Reeves Forth Via telephone. Electronically Signed   By: Margaretha Sheffield M.D.   On: 08/19/2022 13:13   CT HEAD CODE STROKE WO CONTRAST  Result Date: 08/19/2022 CLINICAL DATA:  Code stroke.  Neuro deficit, acute, stroke suspected EXAM: CT HEAD WITHOUT CONTRAST TECHNIQUE: Contiguous axial images were obtained from the base of the skull through the vertex without intravenous contrast. RADIATION DOSE REDUCTION: This exam was performed according to the departmental dose-optimization program which includes automated exposure control, adjustment of the mA and/or kV according to patient size and/or use of iterative reconstruction technique. COMPARISON:  MRI head 03/23/2020. FINDINGS: Motion limited. Failed brain: No evidence of acute large vascular territory infarct, acute hemorrhage, mass lesion, midline shift or hydrocephalus. Patchy white matter hypodensities, nonspecific but compatible with chronic microvascular ischemic disease. Vascular: Limited assessment due to motion without obvious hyperdense vessel. Skull: No acute fracture. Sinuses/Orbits: No acute findings. ASPECTS [facility] Stroke Program Early CT Score) Total score (0-10 with 10 being normal): 10. IMPRESSION: Motion limited assessment without obvious acute intracranial abnormality. MRI  could provide more sensitive evaluation for acute infarct. Code stroke imaging results were communicated on 08/19/2022 at 12:54 Pm to provider Dr. Reeves Forth Via telephone, who verbally acknowledged these results. Electronically Signed   By: Margaretha Sheffield M.D.   On: 08/19/2022 13:03    Labs:  Basic Metabolic Panel: Recent Labs  Lab 09/01/22 0923 09/04/22 0631 09/05/22 0657  NA 137 139 138  K 3.7 3.3* 3.6  CL 103 101 104  CO2 '25 25 28  '$ GLUCOSE 83 82 93  BUN '14 18 15  '$ CREATININE 0.80 0.91 0.91  CALCIUM 9.5 9.9 9.7    CBC:    Latest Ref Rng & Units 09/04/2022    6:31 AM 08/23/2022    6:12 AM 08/21/2022    7:09 AM  CBC  WBC 4.0 - 10.5 K/uL 12.2  10.4  11.6   Hemoglobin 12.0 - 15.0 g/dL 12.2  13.0  12.6   Hematocrit 36.0 - 46.0 % 38.4  39.7  38.8   Platelets 150 - 400 K/uL 328  317  313      CBG: No results for input(s): "GLUCAP" in the last 168 hours.  Brief HPI:   Kaylon Laroche Kinyon is a 81 y.o. female 81 year old female with history of HTN, seizures, positive anti-CCP test, anxiety disorder, mini strokes; who was admitted on 08/19/2022 with acute onset LUE weakness and expressive deficits.  MRI brain done revealing acute infarct in right corona radiata and small infarct in right anterior thalamus.  Neurology felt that stroke was due to small vessel disease and recommended DAPT x3 weeks followed by ASA alone acute on chronic headaches were being managed with use of Toradol and bouts of agitation reported due to claustrophobia.  Therapy was working with patient who continued to be limited by left-sided weakness, headaches as well as difficulty with mobility.  CIR was recommended due to functional decline.   Hospital Course: Niomie Englert Torok was admitted to rehab 08/22/2022 for inpatient therapies to consist of PT, ST and OT at least three hours five days a week. Past admission physiatrist, therapy team and rehab RN have worked together to provide customized collaborative inpatient rehab.  She was  maintained on DAPT during his stay and is tolerating this without side effects.  She is to continue Plavix for 6 more days then stop.  Her headaches had greatly improved therefore tramadol was discontinued.  Blood pressures were monitored on TID basis and and noted to be labile.  Amlodipine was increased to 10 mg with improvement in BP control overall. Follow-up check of BMET showed mild AKI which had resolved with increase in. fluid intake.  K-Dur was added to supplement hypokalemia. Follow-up CBC during her stay showed recurrent mild rise in WBC but no signs or symptoms of infection reported.  She is continent of bowel and bladder.  She has made steady gains during his stay and currently requires supervision.  She will continue to receive follow-up outpatient PT, OT and ST at [facility] outpatient rehab after discharge.   Rehab course: During patient's stay in rehab weekly team conferences were held to monitor patient's progress, set goals and discuss barriers to discharge. At admission, patient required mod assist with mobility and mod to max assist with ADL tasks.  She exhibited mild cognitive linguistic deficits, speech was 100% intelligible and reported biting her buccal area during mastication requiring dysphagia 3 diet. She  has had improvement in activity tolerance, balance, postural control as well as ability to compensate for deficits.  She is able to complete ADL tasks with supervision.  She requires supervision for transfers and to ambulate 150 feet with use of rollator.  She requires supervision to climb 12 stairs.  She is tolerating regular textures with minimal overt S/S of aspiration.  She is able to use safe swallow strategies at modified independent level.  She requires supervision for functional problem-solving and to complete mildly complex tasks. Family education has been completed with husband.  Disposition: Home  Diet: Heart healthy.   Special Instructions: Recommend 30-day event  monitor for stroke work-up. No driving or strenuous activity till cleared by MD. Discharge Instructions     Ambulatory referral to Occupational Therapy   Complete by: As directed    Eval and treat   Ambulatory referral to Physical Medicine Rehab   Complete by: As directed    Ambulatory referral to Physical Therapy   Complete by: As directed    Eval and treat   Ambulatory referral to Speech Therapy   Complete by: As directed    Eval and treat      Allergies as of 09/05/2022       Reactions   Cinobac [cinoxacin] Anaphylaxis   "My throat swelled and I stopped breathing."   Latex Dermatitis   IgE = 14 (WNL) on 04/17/2022 Pt is highly reactive to any latex products. Blisters skin. "Takes my skin off"   Other Shortness Of Breath   Muscle relaxers? Pt thinks allergic to muscle relaxers, reports stops breathing but not sure what medicine and if for sure muscle relaxer   Petrolatum Distillates [petroleum Distillate] Shortness Of Breath   Passes out    Sulfa Antibiotics Itching   Nylon Itching        Medication List     STOP taking these medications    Acidophilus Lactobacillus Caps   cholecalciferol 25 MCG (1000 UNIT) tablet Commonly known as: VITAMIN D3   cyanocobalamin 1000 MCG tablet Commonly known as: VITAMIN B12   ferrous sulfate 325 (65 FE) MG tablet   hydroxychloroquine 200 MG tablet Commonly known as: PLAQUENIL   oxyCODONE 5 MG immediate release tablet Commonly known as: Oxy IR/ROXICODONE  sertraline 50 MG tablet Commonly known as: ZOLOFT   SYSTANE OP   XLEAR SINUS CARE SPRAY NA       TAKE these medications    albuterol 108 (90 Base) MCG/ACT inhaler Commonly known as: VENTOLIN HFA Inhale 2 puffs into the lungs every 4 (four) hours as needed for wheezing or shortness of breath.   amLODipine 10 MG tablet Commonly known as: NORVASC Take 1 tablet (10 mg total) by mouth daily.   aspirin EC 81 MG tablet Take 1 tablet (81 mg total) by mouth daily.  Swallow whole.   atorvastatin 40 MG tablet Commonly known as: LIPITOR Take 1 tablet (40 mg total) by mouth daily.   clopidogrel 75 MG tablet Commonly known as: PLAVIX Take 1 tablet (75 mg total) by mouth daily.   diclofenac Sodium 1 % Gel Commonly known as: VOLTAREN Apply 2 g topically 3 (three) times daily.   DULoxetine 30 MG capsule Commonly known as: CYMBALTA Take 1 capsule (30 mg total) by mouth daily.   folic acid 671 MCG tablet Commonly known as: FOLVITE Take 1 tablet (800 mcg total) by mouth daily.   gabapentin 300 MG capsule Commonly known as: NEURONTIN Take 1 capsule (300 mg total) by mouth 3 (three) times daily.   levothyroxine 50 MCG tablet Commonly known as: SYNTHROID Take 1 tablet (50 mcg total) by mouth daily before breakfast.   losartan 50 MG tablet Commonly known as: COZAAR Take 1 tablet (50 mg total) by mouth daily.   montelukast 10 MG tablet Commonly known as: SINGULAIR Take 1 tablet (10 mg total) by mouth daily.   Multivitamin Gummies Adult Chew Chew 2 capsules by mouth daily.   pantoprazole 40 MG tablet Commonly known as: PROTONIX Take 1 tablet (40 mg total) by mouth See admin instructions. What changed: additional instructions   potassium chloride 10 MEQ tablet Commonly known as: KLOR-CON M Take 2 tablets (20 mEq total) by mouth 2 (two) times daily.   predniSONE 10 MG tablet Commonly known as: DELTASONE Take 1 tablet (10 mg total) by mouth daily.   propranolol ER 60 MG 24 hr capsule Commonly known as: INDERAL LA Take 1 capsule (60 mg total) by mouth daily.        Follow-up Information     Sparks, Leonie Douglas, MD Follow up.   Specialty: Internal Medicine Why: Call in 1-2 days for post hospital follow up Contact information: [place] [address] [phone]         Durel Salts C, DO Follow up.   Specialty: Physical Medicine and Rehabilitation Why: office will call you with  follow up appointment Contact information: [address] [place] [address] [phone]         Vladimir Crofts, MD Follow up.   Specialty: Neurology Why: Call in 1-2 days for post hospital follow up Contact information: [facility] [place] [address] [phone]                 Signed: Bary Leriche 09/06/2022, 10:32 PM

## 2022-09-08 DIAGNOSIS — H35371 Puckering of macula, right eye: Secondary | ICD-10-CM | POA: Diagnosis not present

## 2022-09-09 ENCOUNTER — Ambulatory Visit: Payer: Medicare HMO | Attending: Physical Medicine and Rehabilitation | Admitting: Occupational Therapy

## 2022-09-09 DIAGNOSIS — M6281 Muscle weakness (generalized): Secondary | ICD-10-CM | POA: Insufficient documentation

## 2022-09-09 DIAGNOSIS — R41841 Cognitive communication deficit: Secondary | ICD-10-CM | POA: Diagnosis not present

## 2022-09-09 DIAGNOSIS — R278 Other lack of coordination: Secondary | ICD-10-CM | POA: Insufficient documentation

## 2022-09-09 DIAGNOSIS — R262 Difficulty in walking, not elsewhere classified: Secondary | ICD-10-CM | POA: Insufficient documentation

## 2022-09-09 DIAGNOSIS — I6381 Other cerebral infarction due to occlusion or stenosis of small artery: Secondary | ICD-10-CM | POA: Diagnosis not present

## 2022-09-09 DIAGNOSIS — Z23 Encounter for immunization: Secondary | ICD-10-CM | POA: Diagnosis not present

## 2022-09-09 DIAGNOSIS — S51802A Unspecified open wound of left forearm, initial encounter: Secondary | ICD-10-CM | POA: Diagnosis not present

## 2022-09-09 DIAGNOSIS — S81802A Unspecified open wound, left lower leg, initial encounter: Secondary | ICD-10-CM | POA: Diagnosis not present

## 2022-09-09 NOTE — Therapy (Deleted)
OUTPATIENT OCCUPATIONAL THERAPY NEURO EVALUATION  Patient Name: [name] MRN: [id number] DOB:1941/10/21, 81 y.o., female Today's Date: 09/09/2022  PCP: Dr. Doy Hutching REFERRING PROVIDER: Reesa Chew    OT End of Session - 09/09/22 1307     Visit Number 1    Number of Visits 24    Date for OT Re-Evaluation 12/02/22    OT Start Time 60    OT Stop Time 1400    OT Time Calculation (min) 60 min    Activity Tolerance Patient limited by pain    Behavior During Therapy [facility] for tasks assessed/performed             Past Medical History:  Diagnosis Date   Anemia    Arthritis    Asthma    uses inhaler just prior to surgery to avoid attack   Back pain    from previous injury   Complication of anesthesia    has woken  up during 2 different surgery   Depression    no current issue/treatment; situation   Gallstones    GERD (gastroesophageal reflux disease)    Hiatal hernia    patient does NOT have nerve/muscle disease   History of kidney stones    HLD (hyperlipidemia)    HTN (hypertension)    Hypothyroidism    Kidney stones    Knee pain    Non-diabetic pancreatic hormone dysfunction years   pt. 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   Pancreatitis    Pneumonia    Seizures ([place])    caused by dye injected during a procedure   Shortness of breath    with exertion   Sinus problem    frequent infections/congestion   Stroke ([place]) 2021   reports having CVA in 2021 and having mini strokes before that   Thyroid disease    Past Surgical History:  Procedure Laterality Date   ABDOMINAL HYSTERECTOMY     APPENDECTOMY     CARPAL TUNNEL RELEASE  10+ years ago   bilateral   EYE SURGERY  3 yrs ago   bilateral cataracts   FOOT OSTEOTOMY  6 weeks ago   Left foot: great, 2nd & 3rd   FOOT OSTEOTOMY  5 years ago   Right great toe   HAND SURGERY Bilateral 2011-most recent   multiple hand surgeries, 2 on left, 3 on right   KNEE ARTHROPLASTY Right 04/28/2022   Procedure:  COMPUTER ASSISTED TOTAL KNEE ARTHROPLASTY;  Surgeon: Dereck Leep, MD;  Location: [facility];  Service: Orthopedics;  Laterality: Right;   LOOP RECORDER INSERTION N/A 05/16/2020   Procedure: LOOP RECORDER INSERTION;  Surgeon: Isaias Cowman, MD;  Location: [place] [facility];  Service: Cardiovascular;  Laterality: N/A;   NASAL SINUS SURGERY  most recent 7-8 yrs ago   7 sinus surgeries    TRIGGER FINGER RELEASE  11/19/2011   Procedure: RELEASE TRIGGER FINGER/A-1 PULLEY;  Surgeon: Wynonia Sours, MD;  Location: [place];  Service: Orthopedics;  Laterality: Right;  release a-1 pulley right index finger and cyst removal   WRIST GANGLION EXCISION  1980's   right   Patient Active Problem List   Diagnosis Date Noted   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   GERD (gastroesophageal reflux disease) 08/21/2022   Agitation 08/20/2022   Acute left-sided weakness 08/20/2022   Expressive aphasia    Stroke ([place]) 08/19/2022   Leukocytosis 08/19/2022   History of urticaria 04/28/2022   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   Primary  osteoarthritis of left knee 02/24/2022   Primary osteoarthritis of right knee 02/24/2022   Lumbar spondylolysis 04/12/2020   History of CVA (cerebrovascular accident) 03/26/2020   Low back pain radiating to right lower extremity 03/21/2020   B12 deficiency 03/06/2020   Positive anti-CCP test 12/21/2019   Arthralgia 12/13/2019   Dermatitis 12/13/2019   Rheumatoid factor positive 12/13/2019   Essential hypertension 12/11/2018   Palpitations 12/11/2018   Acquired hypothyroidism 11/10/2018   Arthritis of knee 09/17/2016   Anxiety 11/22/2014   Asthma without status asthmaticus 11/22/2014   Benign neoplasm of colon, unspecified 11/22/2014   Environmental allergies 11/22/2014   Hypertriglyceridemia 11/22/2014   Hypokalemia 11/22/2014   Personal history of disease of skin and subcutaneous tissue 11/22/2014    ONSET DATE:   REFERRING DIAG:  CVA  THERAPY DIAG:  Muscle weakness (generalized)  Other lack of coordination  Rationale for Evaluation and Treatment Rehabilitation  SUBJECTIVE:   SUBJECTIVE STATEMENT: Pt. Was present with he husband  Pt accompanied by: significant other  PERTINENT HISTORY:    PRECAUTIONS: Fall  WEIGHT BEARING RESTRICTIONS No  PAIN:  Are you having pain? No  FALLS: Has patient fallen in last 6 months? Yes. Number of falls ***  LIVING ENVIRONMENT: Lives with: lives with their spouse Lives in: House/apartment Stairs: Yes: Internal: *** steps; {rails:26871} and External: 2 front, 6 in back steps; can reach both. No rails in the front Has following equipment at home: Single point cane, Quad cane small base, Quad cane large base, Shower bench, and bed side commode  PLOF: Independent  PATIENT GOALS ***  OBJECTIVE:   HAND DOMINANCE: Right  ADLs: Overall ADLs: *** Transfers/ambulation related to ADLs: Eating: Independent, Assist  with cutting meat, and opening packets Grooming: Brushes with the right hand,  UB Dressing: LB Dressing: Independent Toileting: *** Bathing: *** Tub Shower transfers: *** Equipment: {equipment:25573}   IADLs: Shopping:  Has not shopped since being discharged home from the hospital Light housekeeping: *** Meal Prep: *** Community mobility: *** Medication management: *** Financial management: *** Handwriting: {OTWRITTENEXPRESSION:25361}  MOBILITY STATUS: {OTMOBILITY:25360}  POSTURE COMMENTS:  {posture:25561} Sitting balance: {sitting balance:25483}  ACTIVITY TOLERANCE: Activity tolerance:  fatigues in y 10-15 min.  FUNCTIONAL OUTCOME MEASURES: {OTFUNCTIONALMEASURES:27238}  UPPER EXTREMITY ROM     {AROM/PROM:27142} ROM Right eval Left eval  Shoulder flexion    Shoulder abduction    Shoulder adduction    Shoulder extension    Shoulder internal rotation    Shoulder external rotation    Elbow flexion    Elbow extension    Wrist  flexion    Wrist extension    Wrist ulnar deviation    Wrist radial deviation    Wrist pronation    Wrist supination    (Blank rows = not tested)   UPPER EXTREMITY MMT:     MMT Right eval Left eval  Shoulder flexion    Shoulder abduction    Shoulder adduction    Shoulder extension    Shoulder internal rotation    Shoulder external rotation    Middle trapezius    Lower trapezius    Elbow flexion    Elbow extension    Wrist flexion    Wrist extension    Wrist ulnar deviation    Wrist radial deviation    Wrist pronation    Wrist supination    (Blank rows = not tested)  HAND FUNCTION: {handfunction:27230}  COORDINATION: {otcoordination:27237}  SENSATION: {sensation:27233}  EDEMA: ***  MUSCLE TONE: {UETONE:25567}  COGNITION: Overall  cognitive status: {cognition:24006}  VISION: Subjective report: *** Baseline vision: {OTBASELINEVISION:25363} Visual history: {OTVISUALHISTORY:25364}  VISION ASSESSMENT: {visionassessment:27231}  Patient has difficulty with following activities due to following visual impairments: ***  PERCEPTION: {Perception:25564}  PRAXIS: {Praxis:25565}  OBSERVATIONS: ***   TODAY'S TREATMENT:  ***   PATIENT EDUCATION: Education details: *** Person educated: {Person educated:25204} Education method: {Education Method:25205} Education comprehension: {Education Comprehension:25206}   HOME EXERCISE PROGRAM: ***    GOALS: Goals reviewed with patient? {yes/no:20286}  SHORT TERM GOALS: Target date: ***  *** Baseline: Goal status: {GOALSTATUS:25110}  2.  *** Baseline:  Goal status: {GOALSTATUS:25110}  3.  *** Baseline:  Goal status: {GOALSTATUS:25110}  4.  *** Baseline:  Goal status: {GOALSTATUS:25110}  5.  *** Baseline:  Goal status: {GOALSTATUS:25110}  6.  *** Baseline:  Goal status: {GOALSTATUS:25110}  LONG TERM GOALS: Target date: ***  *** Baseline:  Goal status: {GOALSTATUS:25110}  2.   *** Baseline:  Goal status: {GOALSTATUS:25110}  3.  *** Baseline:  Goal status: {GOALSTATUS:25110}  4.  *** Baseline:  Goal status: {GOALSTATUS:25110}  5.  *** Baseline:  Goal status: {GOALSTATUS:25110}  6.  *** Baseline:  Goal status: {GOALSTATUS:25110}  ASSESSMENT:  CLINICAL IMPRESSION: Patient is a *** y.o. *** who was seen today for occupational therapy evaluation for ***.   PERFORMANCE DEFICITS in functional skills including {OT physical skills:25468}, cognitive skills including {OT cognitive skills:25469}, and psychosocial skills including {OT psychosocial skills:25470}.   IMPAIRMENTS are limiting patient from {OT performance deficits:25471}.   COMORBIDITIES {Comorbidities:25485} that affects occupational performance. Patient will benefit from skilled OT to address above impairments and improve overall function.  MODIFICATION OR ASSISTANCE TO COMPLETE EVALUATION: {OT modification:25474}  OT OCCUPATIONAL PROFILE AND HISTORY: {OT PROFILE AND HISTORY:25484}  CLINICAL DECISION MAKING: {OT CDM:25475}  REHAB POTENTIAL: {rehabpotential:25112}  EVALUATION COMPLEXITY: {Evaluation complexity:25115}    PLAN: OT FREQUENCY: {rehab frequency:25116}  OT DURATION: {rehab duration:25117}  PLANNED INTERVENTIONS: {OT Interventions:25467}  RECOMMENDED OTHER SERVICES: ***  CONSULTED AND AGREED WITH PLAN OF CARE: {UOH:72902}  PLAN FOR NEXT SESSION: Harrel Carina, OT 09/09/2022, 1:19 PM

## 2022-09-09 NOTE — Therapy (Addendum)
OUTPATIENT OCCUPATIONAL THERAPY NEURO EVALUATION  Patient Name: [name] MRN: [id number] DOB:1941-01-31, 81 y.o., female Today's Date: 09/09/2022  PCP: Dr. Doy Hutching REFERRING PROVIDER: Reesa Chew   OT End of Session - 09/09/22 1307     Visit Number 1    Number of Visits 24    Date for OT Re-Evaluation 12/02/22    OT Start Time 26    OT Stop Time 1400    OT Time Calculation (min) 60 min    Activity Tolerance Patient limited by pain    Behavior During Therapy [facility] for tasks assessed/performed             Past Medical History:  Diagnosis Date   Anemia    Arthritis    Asthma    uses inhaler just prior to surgery to avoid attack   Back pain    from previous injury   Complication of anesthesia    has woken  up during 2 different surgery   Depression    no current issue/treatment; situation   Gallstones    GERD (gastroesophageal reflux disease)    Hiatal hernia    patient does NOT have nerve/muscle disease   History of kidney stones    HLD (hyperlipidemia)    HTN (hypertension)    Hypothyroidism    Kidney stones    Knee pain    Non-diabetic pancreatic hormone dysfunction years   [name]. 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   Pancreatitis    Pneumonia    Seizures ([place])    caused by dye injected during a procedure   Shortness of breath    with exertion   Sinus problem    frequent infections/congestion   Stroke ([place]) 2021   reports having CVA in 2021 and having mini strokes before that   Thyroid disease    Past Surgical History:  Procedure Laterality Date   ABDOMINAL HYSTERECTOMY     APPENDECTOMY     CARPAL TUNNEL RELEASE  10+ years ago   bilateral   EYE SURGERY  3 yrs ago   bilateral cataracts   FOOT OSTEOTOMY  6 weeks ago   Left foot: great, 2nd & 3rd   FOOT OSTEOTOMY  5 years ago   Right great toe   HAND SURGERY Bilateral 2011-most recent   multiple hand surgeries, 2 on left, 3 on right   KNEE ARTHROPLASTY Right 04/28/2022   Procedure:  COMPUTER ASSISTED TOTAL KNEE ARTHROPLASTY;  Surgeon: Dereck Leep, MD;  Location: [facility];  Service: Orthopedics;  Laterality: Right;   LOOP RECORDER INSERTION N/A 05/16/2020   Procedure: LOOP RECORDER INSERTION;  Surgeon: Isaias Cowman, MD;  Location: [place] [facility];  Service: Cardiovascular;  Laterality: N/A;   NASAL SINUS SURGERY  most recent 7-8 yrs ago   7 sinus surgeries    TRIGGER FINGER RELEASE  11/19/2011   Procedure: RELEASE TRIGGER FINGER/A-1 PULLEY;  Surgeon: Wynonia Sours, MD;  Location: [place];  Service: Orthopedics;  Laterality: Right;  release a-1 pulley right index finger and cyst removal   WRIST GANGLION EXCISION  1980's   right   Patient Active Problem List   Diagnosis Date Noted   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   GERD (gastroesophageal reflux disease) 08/21/2022   Agitation 08/20/2022   Acute left-sided weakness 08/20/2022   Expressive aphasia    Stroke ([place]) 08/19/2022   Leukocytosis 08/19/2022   History of urticaria 04/28/2022   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   Primary osteoarthritis  of left knee 02/24/2022   Primary osteoarthritis of right knee 02/24/2022   Lumbar spondylolysis 04/12/2020   History of CVA (cerebrovascular accident) 03/26/2020   Low back pain radiating to right lower extremity 03/21/2020   B12 deficiency 03/06/2020   Positive anti-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 12/21/2019   Arthralgia 12/13/2019   Dermatitis 12/13/2019   Rheumatoid factor positive 12/13/2019   Essential hypertension 12/11/2018   Palpitations 12/11/2018   Acquired hypothyroidism 11/10/2018   Arthritis of knee 09/17/2016   Anxiety 11/22/2014   Asthma without status asthmaticus 11/22/2014   Benign neoplasm of colon, unspecified 11/22/2014   Environmental allergies 11/22/2014   Hypertriglyceridemia 11/22/2014   Hypokalemia 11/22/2014   Personal history of disease of skin and subcutaneous tissue 11/22/2014    ONSET DATE: 08/18/2022  REFERRING  DIAG: CVA  THERAPY DIAG:  Muscle weakness (generalized)  Other lack of coordination  Rationale for Evaluation and Treatment Rehabilitation  SUBJECTIVE:   SUBJECTIVE STATEMENT:  [name]. Had a fall 3 days after leaving the hospital.  [name] accompanied by: significant other  PERTINENT HISTORY: Patient is a 81 year old female who was diagnosed with a right thalamic CVA on 08/18/2022 with left-sided weakness.  Patient underwent inpatient rehabilitation for 2 weeks.  Patient was assessed and was scheduled for a knee replacement on 08/29/2022 however had to cancel it due to having had a CVA.  Patient had a recent fall 2 days after discharging from inpatient rehab. past medical history includes: Knee replacement, essential HTN, hypokalemia, leukocytosis, seizures, positive anti-- CCP test, anxiety disorder, mini strokes.  Patient had shingles with left eye nerve pain s/p 1 year ago.   PRECAUTIONS: Fall  WEIGHT BEARING RESTRICTIONS No  PAIN:  Are you having pain? No  FALLS: Has patient fallen in last 6 months? Yes. Number of falls 1  LIVING ENVIRONMENT: Lives with: Lives with Spouse Lives in: House/apartment Stairs: 2 storey home, resides on the first floor.  External: 2 stairs front no rails, and 6 in back with rails Has following equipment at home: Single point cane, Walker - 2 wheeled, Environmental consultant - 4 wheeled, Shower bench, and bed side commode  PLOF: Independent  PATIENT GOALS  [name]. Regain the use of her left arm  OBJECTIVE:   HAND DOMINANCE: Right  ADLs: Overall ADLs: Husband assists [name]. as needed Transfers/ambulation related to ADLs:[name]. Has assistive devices. Eating: [name]. Is independent with the right hand. [name]. Is unable to cut food, and open packets Grooming: [name]. Is using her right hand, however has difficulty using her left to assist with haircare UB Dressing: [name]. Is independent donning a pullover shirt, and button down shirt. Has difficulty with buttoning, LB Dressing:  Independent  donning pants, and socks. Difficulty tying shoes. Toileting: Independent Bathing: independent using the right hand, difficulty using the LUE for the right side. Tub Shower transfers: Supervision Equipment: See above for equipment   IADLs: Shopping:  Has not had the opportunity for grocery shopping yet Light housekeeping: Husband assisting with light house keeping Meal Prep:  Dependent Community mobility: Relies of family/friends Medication management: Husband Oncologist: TBD Handwriting: 75% legible  MOBILITY STATUS: Hx of falls  POSTURE COMMENTS:  No Significant postural limitations Sitting balance: supported sitting balance WFL  ACTIVITY TOLERANCE: Activity tolerance:  Fatigues with 10-15 min.  FUNCTIONAL OUTCOME MEASURES: FOTO: 45  UPPER EXTREMITY ROM     Active ROM Right Eval: WFL Left eval  Shoulder flexion  55(117)  Shoulder abduction  67(108  Shoulder adduction    Shoulder extension  Shoulder internal rotation    Shoulder external rotation    Elbow flexion  122(144)  Elbow extension  -16(0)  Wrist flexion  62  Wrist extension  -22(50)  Wrist ulnar deviation    Wrist radial deviation    Wrist pronation    Wrist supination    (Blank rows = not tested)  Left digit flexion to [facility]: 2nd: 2cm, 3rd: 0cm, 4th: 0cm, 5th: 0cm   Limited Left full digit extension   UPPER EXTREMITY MMT:     MMT Right Eval: 4+/5 overall Left eval  Shoulder flexion  3-/5  Shoulder abduction  3-/5  Shoulder adduction    Shoulder extension    Shoulder internal rotation    Shoulder external rotation    Middle trapezius    Lower trapezius    Elbow flexion  3+/5  Elbow extension  3+/5  Wrist flexion    Wrist extension  2-/5  Wrist ulnar deviation    Wrist radial deviation    Wrist pronation    Wrist supination    (Blank rows = not tested)  HAND FUNCTION: Left Unable  COORDINATION:  Impaired  SENSATION: Light touch: WFL, proprioceptive  awareness: Intact  EDEMA: N/A  MUSCLE TONE: LUE: Hypotonic  COGNITION: Overall cognitive status: Within functional limits for tasks assessed [name]. Reports memory limitations.  Patient has bilateral hearing aids however was not wearing them during the initial evaluation.  VISION: Subjective report: [name]. report having shingles affecting left eye  s/p 1 year. Has nerve pain Baseline vision: Wears glasses for reading only Visual history: Report no changes  VISION ASSESSMENT:   WFL for tasks performed  PERCEPTION: Intact  PRAXIS: Impaired: Motor planning  OBSERVATIONS: Patient reports being sore following a recent fall after discharging from inpatient rehab.   TODAY'S TREATMENT:    [name]. Participated in an initial evaluation   PATIENT EDUCATION: Education details: OT services, POC, and goals. Person educated: Patient and Spouse Education method: Customer service manager Education comprehension: verbalized understanding, returned demonstration, and needs further education   HOME EXERCISE PROGRAM:  Assess ongoing need for HEP    GOALS: Goals reviewed with patient? Yes  SHORT TERM GOALS: Target date: 10/21/2022      Patient will be independent with home exercise program for the left upper extremity Baseline: No current home exercise program Goal status: INITIAL  2.  Patient will independently formulate a full composite fist with the left hand.  Baseline: Second digit to the [facility]: 2cm.  Goal status: INITIAL              3: [name]. will improve active digit extension to be able to place her hand flat tabletop surface in preparation for weightbearing proprioceptive input. Baseline: Is unable to actively full digit extension Goal status: INITIAL   LONG TERM GOALS: Target date: 12/02/2022  Patient will improve active left shoulder flexion by 20 degrees preparation for reaching for the refrigerator door. Baseline: Eval: Left shoulder flexion 55(117) Goal status:  INITIAL  2.  Patient will improve left shoulder active abduction to be able to comb her hair Baseline: Eval: Left shoulder abduction is 67(108) Goal status: INITIAL  3.  Patient will patiently button her shirt with modified independence. Baseline: Eval: Patient requires increased time to complete Goal status: INITIAL  4.  Patient well improve left grip strength preparation for cutting food Baseline: Eval:  Goal status: INITIAL  5.  [name]. will independently recall adaptive  strategies for performing ADL tasks including: flossing teeth, donning bra,  applying makeup. Baseline: Eval: [name]. unable to perform Goal status: INITIAL  6.  [name]. will improve Foto score by 5 points to reflect patient perceived performance improvement assessment specific ADLs  and IADLs Baseline: Eval: 45 Goal status: INITIAL    ASSESSMENT:  CLINICAL IMPRESSION:  Patient is a 81 y.o. female who was seen today for occupational therapy evaluation for left upper extremity weakness following a CVA.  Patient's FOTO score is 45. Patient presents with left upper extremity weakness, decreased left upper extremity range of motion, impaired motor control, decreased coordination, and limited functional mobility which hinder her ability to complete daily ADL and IADL tasks.  Patient will benefit from OT services to work on improving left shoulder elbow and wrist range of motion, improve left hand composite fisting, improve motor control and coordination in order to prepare the left upper extremity and hand for functional reaching, and engaging the left upper extremity when performing performing hair care, buttoning, holding objects and cutting food.  Patient will benefit from adaptive and compensatory strategies during daily ADL and IADL care.  PERFORMANCE DEFICITS in functional skills including ADLs, IADLs, coordination, proprioception, ROM, strength, [place], and [facility], cognitive skills including memory, and psychosocial skills including  coping strategies, environmental adaptation, interpersonal interactions, and routines and behaviors.   IMPAIRMENTS are limiting patient from ADLs, IADLs, education, leisure, and social participation.   COMORBIDITIES may have co-morbidities  that affects occupational performance. Patient will benefit from skilled OT to address above impairments and improve overall function.  MODIFICATION OR ASSISTANCE TO COMPLETE EVALUATION: Min-Moderate modification of tasks or assist with assess necessary to complete an evaluation.  OT OCCUPATIONAL PROFILE AND HISTORY: Detailed assessment: Review of records and additional review of physical, cognitive, psychosocial history related to current functional performance.  CLINICAL DECISION MAKING: Moderate - several treatment options, min-mod task modification necessary  REHAB POTENTIAL: Good  EVALUATION COMPLEXITY: Moderate    PLAN: OT FREQUENCY: 3x/week  OT DURATION: 12 weeks  PLANNED INTERVENTIONS: self care/ADL training, therapeutic exercise, therapeutic activity, neuromuscular re-education, manual therapy, passive range of motion, functional mobility training, electrical stimulation, and paraffin  RECOMMENDED OTHER SERVICES: [name]  CONSULTED AND AGREED WITH PLAN OF CARE: Patient and family member/caregiver  PLAN FOR NEXT SESSION: Initiate OT treatment  Harrel Carina, MS, OTR/L   Harrel Carina, OT 09/09/2022, 2:48 PM

## 2022-09-09 NOTE — Therapy (Deleted)
[place] MAIN [facility] SERVICES [address] [place], [place], [address] Phone: [phone]   Fax:  [phone]  Occupational Therapy Evaluation  Patient Details  Name: [name] MRN: [id number] Date of Birth: 02/23/1941 No data recorded  Encounter Date: 09/09/2022   OT End of Session - 09/09/22 1307     Visit Number 1    Number of Visits 24    Date for OT Re-Evaluation 12/02/22    OT Start Time 16    OT Stop Time 1400    OT Time Calculation (min) 60 min    Activity Tolerance Patient limited by pain    Behavior During Therapy [facility] for tasks assessed/performed             Past Medical History:  Diagnosis Date   Anemia    Arthritis    Asthma    uses inhaler just prior to surgery to avoid attack   Back pain    from previous injury   Complication of anesthesia    has woken  up during 2 different surgery   Depression    no current issue/treatment; situation   Gallstones    GERD (gastroesophageal reflux disease)    Hiatal hernia    patient does NOT have nerve/muscle disease   History of kidney stones    HLD (hyperlipidemia)    HTN (hypertension)    Hypothyroidism    Kidney stones    Knee pain    Non-diabetic pancreatic hormone dysfunction years   pt. 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   Pancreatitis    Pneumonia    Seizures ([place])    caused by dye injected during a procedure   Shortness of breath    with exertion   Sinus problem    frequent infections/congestion   Stroke ([place]) 2021   reports having CVA in 2021 and having mini strokes before that   Thyroid disease     Past Surgical History:  Procedure Laterality Date   ABDOMINAL HYSTERECTOMY     APPENDECTOMY     CARPAL TUNNEL RELEASE  10+ years ago   bilateral   EYE SURGERY  3 yrs ago   bilateral cataracts   FOOT OSTEOTOMY  6 weeks ago   Left foot: great, 2nd & 3rd   FOOT OSTEOTOMY  5 years ago   Right great toe   HAND SURGERY Bilateral 2011-most  recent   multiple hand surgeries, 2 on left, 3 on right   KNEE ARTHROPLASTY Right 04/28/2022   Procedure: COMPUTER ASSISTED TOTAL KNEE ARTHROPLASTY;  Surgeon: Dereck Leep, MD;  Location: [facility] ORS;  Service: Orthopedics;  Laterality: Right;   LOOP RECORDER INSERTION N/A 05/16/2020   Procedure: LOOP RECORDER INSERTION;  Surgeon: Isaias Cowman, MD;  Location: [place] CV [facility];  Service: Cardiovascular;  Laterality: N/A;   NASAL SINUS SURGERY  most recent 7-8 yrs ago   7 sinus surgeries    TRIGGER FINGER RELEASE  11/19/2011   Procedure: RELEASE TRIGGER FINGER/A-1 PULLEY;  Surgeon: Wynonia Sours, MD;  Location: [place];  Service: Orthopedics;  Laterality: Right;  release a-1 pulley right index finger and cyst removal   WRIST GANGLION EXCISION  1980's   right    There were no vitals filed for this visit.  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   Primary osteoarthritis of left knee 02/24/2022   Primary osteoarthritis of right knee 02/24/2022   Lumbar spondylolysis 04/12/2020   History of CVA (cerebrovascular accident) 03/26/2020   Low back pain radiating to right lower extremity 03/21/2020   B12 deficiency 03/06/2020   Positive anti-CCP test 12/21/2019   Arthralgia 12/13/2019   Dermatitis 12/13/2019   Rheumatoid factor positive 12/13/2019    Essential hypertension 12/11/2018   Palpitations 12/11/2018   Acquired hypothyroidism 11/10/2018   Arthritis of knee 09/17/2016   Anxiety 11/22/2014   Asthma without status asthmaticus 11/22/2014   Benign neoplasm of colon, unspecified 11/22/2014   Environmental allergies 11/22/2014   Hypertriglyceridemia 11/22/2014   Hypokalemia 11/22/2014   Personal history of disease of skin and subcutaneous tissue 11/22/2014    Harrel Carina, OT 09/09/2022, 1:19 PM  [place] MAIN [facility] SERVICES [address] [place], [place], [address] Phone: [phone]   Fax:  [phone]  Name: MCKAYLIE VASEY MRN: [id number] Date of Birth: Dec 07, 1941

## 2022-09-11 ENCOUNTER — Ambulatory Visit: Payer: Medicare HMO | Admitting: Occupational Therapy

## 2022-09-11 ENCOUNTER — Encounter: Payer: Self-pay | Admitting: Occupational Therapy

## 2022-09-11 DIAGNOSIS — R41841 Cognitive communication deficit: Secondary | ICD-10-CM | POA: Diagnosis not present

## 2022-09-11 DIAGNOSIS — R278 Other lack of coordination: Secondary | ICD-10-CM

## 2022-09-11 DIAGNOSIS — I6381 Other cerebral infarction due to occlusion or stenosis of small artery: Secondary | ICD-10-CM | POA: Diagnosis not present

## 2022-09-11 DIAGNOSIS — R262 Difficulty in walking, not elsewhere classified: Secondary | ICD-10-CM | POA: Diagnosis not present

## 2022-09-11 DIAGNOSIS — M6281 Muscle weakness (generalized): Secondary | ICD-10-CM | POA: Diagnosis not present

## 2022-09-11 NOTE — Therapy (Signed)
OUTPATIENT OCCUPATIONAL THERAPY NEURO TREATMENT NOTE  Patient Name: [name] MRN: [id number] DOB:[birth year], 81 y.o., female Today's Date: 09/11/2022  PCP: Dr. Doy Hutching REFERRING PROVIDER: Reesa Chew   OT End of Session - 09/11/22 1628     Visit Number 2    Number of Visits 82    Date for OT Re-Evaluation 12/02/22    Authorization Time Period Progress report period starting 09/09/2022    OT Start Time 11    OT Stop Time 1345    OT Time Calculation (min) 45 min    Activity Tolerance Patient limited by pain    Behavior During Therapy [facility] for tasks assessed/performed             Past Medical History:  Diagnosis Date   Anemia    Arthritis    Asthma    uses inhaler just prior to surgery to avoid attack   Back pain    from previous injury   Complication of anesthesia    has woken  up during 2 different surgery   Depression    no current issue/treatment; situation   Gallstones    GERD (gastroesophageal reflux disease)    Hiatal hernia    patient does NOT have nerve/muscle disease   History of kidney stones    HLD (hyperlipidemia)    HTN (hypertension)    Hypothyroidism    Kidney stones    Knee pain    Non-diabetic pancreatic hormone dysfunction years   pt. 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   Pancreatitis    Pneumonia    Seizures ([place])    caused by dye injected during a procedure   Shortness of breath    with exertion   Sinus problem    frequent infections/congestion   Stroke ([place]) 2021   reports having CVA in 2021 and having mini strokes before that   Thyroid disease    Past Surgical History:  Procedure Laterality Date   ABDOMINAL HYSTERECTOMY     APPENDECTOMY     CARPAL TUNNEL RELEASE  10+ years ago   bilateral   EYE SURGERY  3 yrs ago   bilateral cataracts   FOOT OSTEOTOMY  6 weeks ago   Left foot: great, 2nd & 3rd   FOOT OSTEOTOMY  5 years ago   Right great toe   HAND SURGERY Bilateral 2011-most recent   multiple hand surgeries, 2  on left, 3 on right   KNEE ARTHROPLASTY Right 04/28/2022   Procedure: COMPUTER ASSISTED TOTAL KNEE ARTHROPLASTY;  Surgeon: Dereck Leep, MD;  Location: [facility] ORS;  Service: Orthopedics;  Laterality: Right;   LOOP RECORDER INSERTION N/A 05/16/2020   Procedure: LOOP RECORDER INSERTION;  Surgeon: Isaias Cowman, MD;  Location: [place] CV [facility];  Service: Cardiovascular;  Laterality: N/A;   NASAL SINUS SURGERY  most recent 7-8 yrs ago   7 sinus surgeries    TRIGGER FINGER RELEASE  11/19/2011   Procedure: RELEASE TRIGGER FINGER/A-1 PULLEY;  Surgeon: Wynonia Sours, MD;  Location: [place];  Service: Orthopedics;  Laterality: Right;  release a-1 pulley right index finger and cyst removal   WRIST GANGLION EXCISION  1980's   right   Patient Active Problem List   Diagnosis Date Noted   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   GERD (gastroesophageal reflux disease) 08/21/2022   Agitation 08/20/2022   Acute left-sided weakness 08/20/2022   Expressive aphasia    Stroke ([place]) 08/19/2022   Leukocytosis 08/19/2022   History of urticaria  04/28/2022   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   Primary osteoarthritis of left knee 02/24/2022   Primary osteoarthritis of right knee 02/24/2022   Lumbar spondylolysis 04/12/2020   History of CVA (cerebrovascular accident) 03/26/2020   Low back pain radiating to right lower extremity 03/21/2020   B12 deficiency 03/06/2020   Positive anti-CCP test 12/21/2019   Arthralgia 12/13/2019   Dermatitis 12/13/2019   Rheumatoid factor positive 12/13/2019   Essential hypertension 12/11/2018   Palpitations 12/11/2018   Acquired hypothyroidism 11/10/2018   Arthritis of knee 09/17/2016   Anxiety 11/22/2014   Asthma without status asthmaticus 11/22/2014   Benign neoplasm of colon, unspecified 11/22/2014   Environmental allergies 11/22/2014   Hypertriglyceridemia 11/22/2014   Hypokalemia 11/22/2014   Personal history of disease of skin and  subcutaneous tissue 11/22/2014    ONSET DATE: 08/18/2022  REFERRING DIAG: CVA  THERAPY DIAG:  Muscle weakness (generalized)  Other lack of coordination  Rationale for Evaluation and Treatment Rehabilitation  SUBJECTIVE:   SUBJECTIVE STATEMENT:  Pt. Had a fall 3 days after leaving the hospital.  Pt accompanied by: significant other  PERTINENT HISTORY: Patient is a 81 year old female who was diagnosed with a right thalamic CVA on 08/18/2022 with left-sided weakness.  Patient underwent inpatient rehabilitation for 2 weeks.  Patient was assessed and was scheduled for a knee replacement on 08/29/2022 however had to cancel it due to having had a CVA.  Patient had a recent fall 2 days after discharging from inpatient rehab. past medical history includes: Knee replacement, essential HTN, hypokalemia, leukocytosis, seizures, positive anti-- CCP test, anxiety disorder, mini strokes.  Patient had shingles with left eye nerve pain s/p 1 year ago.   PRECAUTIONS: Fall  WEIGHT BEARING RESTRICTIONS No  PAIN:  Are you having pain? No  FALLS: Has patient fallen in last 6 months? Yes. Number of falls 1  LIVING ENVIRONMENT: Lives with: Lives with Spouse Lives in: House/apartment Stairs: 2 storey home, resides on the first floor.  External: 2 stairs front no rails, and 6 in back with rails Has following equipment at home: Single point cane, Walker - 2 wheeled, Environmental consultant - 4 wheeled, Shower bench, and bed side commode  PLOF: Independent  PATIENT GOALS  Pt. Regain the use of her left arm  OBJECTIVE:   HAND DOMINANCE: Right  ADLs: Overall ADLs: Husband assists pt. as needed Transfers/ambulation related to ADLs:Pt. Has assistive devices. Eating: Pt. Is independent with the right hand. Pt. Is unable to cut food, and open packets Grooming: Pt. Is using her right hand, however has difficulty using her left to assist with haircare UB Dressing: Pt. Is independent donning a pullover shirt, and button  down shirt. Has difficulty with buttoning, LB Dressing:  Independent donning pants, and socks. Difficulty tying shoes. Toileting: Independent Bathing: independent using the right hand, difficulty using the LUE for the right side. Tub Shower transfers: Supervision Equipment: See above for equipment   IADLs: Shopping:  Has not had the opportunity for grocery shopping yet Light housekeeping: Husband assisting with light house keeping Meal Prep:  Dependent Community mobility: Relies of family/friends Medication management: Husband Oncologist: TBD Handwriting: 75% legible  MOBILITY STATUS: Hx of falls  POSTURE COMMENTS:  No Significant postural limitations Sitting balance: supported sitting balance WFL  ACTIVITY TOLERANCE: Activity tolerance:  Fatigues with 10-15 min.  FUNCTIONAL OUTCOME MEASURES: FOTO: 45  UPPER EXTREMITY ROM     Active ROM Right Eval: WFL Left eval  Shoulder flexion  55(117)  Shoulder abduction  09(735  Shoulder adduction    Shoulder extension    Shoulder internal rotation    Shoulder external rotation    Elbow flexion  122(144)  Elbow extension  -16(0)  Wrist flexion  62  Wrist extension  -22(50)  Wrist ulnar deviation    Wrist radial deviation    Wrist pronation    Wrist supination    (Blank rows = not tested)  Left digit flexion to [facility]: 2nd: 2cm, 3rd: 0cm, 4th: 0cm, 5th: 0cm   Limited Left full digit extension   UPPER EXTREMITY MMT:     MMT Right Eval: 4+/5 overall Left eval  Shoulder flexion  3-/5  Shoulder abduction  3-/5  Shoulder adduction    Shoulder extension    Shoulder internal rotation    Shoulder external rotation    Middle trapezius    Lower trapezius    Elbow flexion  3+/5  Elbow extension  3+/5  Wrist flexion    Wrist extension  2-/5  Wrist ulnar deviation    Wrist radial deviation    Wrist pronation    Wrist supination    (Blank rows = not tested)  HAND FUNCTION: Left  Unable  COORDINATION:  Impaired  SENSATION: Light touch: WFL, proprioceptive awareness: Intact  EDEMA: N/A  MUSCLE TONE: LUE: Hypotonic  COGNITION: Overall cognitive status: Within functional limits for tasks assessed Pt. Reports memory limitations.  Patient has bilateral hearing aids however was not wearing them during the initial evaluation.  VISION: Subjective report: Pt. report having shingles affecting left eye  s/p 1 year. Has nerve pain Baseline vision: Wears glasses for reading only Visual history: Report no changes  VISION ASSESSMENT:   WFL for tasks performed  PERCEPTION: Intact  PRAXIS: Impaired: Motor planning  OBSERVATIONS: Patient reports being sore following a recent fall after discharging from inpatient rehab.   TODAY'S TREATMENT:    Manual therapy:   Patient tolerated soft tissue mobilizations from carpal and metacarpal spread stretches to prepare the hand for range of motion and engaging weightbearing proprioception.  Patient tolerated scapular mobilizations for elevation, depression, and abduction, and rotation.  Manual therapy was performed independent of hand in preparation for there ex and neuromuscular reeducation.   Neuromuscular education:   Patient worked on weightbearing and proprioception through the left upper extremity and hand, to normalize home and prepare the upper extremity for range of motion, and functional use.  Patient worked on initiating grasp patterns.  Using the left thumb with the second and third digit to grasp [place] style discs.  Patient worked on several movement patterns with the discs.  Patient worked on grasping the discs from the tabletop placing on the mat beside her to the left,  and patient worked on grasping them from the mat beside her and placing them on the table in front of her.  Patient then worked on grasping them from the tabletop and reaching up with shoulder flexion to place him into a container.  Emphasis was  placed on extending the digits completely when releasing each disc.    Therapeutic exercise:   Patient tolerated passive range of motion in all joint ranges of the left upper extremity in supine, followed by active range of motion reps.  Patient worked on horizontal abduction combining wrist extension when reaching across midline for her opposite shoulder.  Patient worked on shoulder left shoulder stabilization exercises fine with her shoulder flexed at 90 degrees with elbow in full extension.  Patient worked on stabilizing her shoulder with  perturbations, as well as protraction.  Patient worked on active assistive range of motion with the shoulder flexion at the tabletop while sitting.   PATIENT EDUCATION: Education details: OT services, POC, and goals. Person educated: Patient and Spouse Education method: Customer service manager Education comprehension: verbalized understanding, returned demonstration, and needs further education   HOME EXERCISE PROGRAM:  Assess ongoing need for HEP    GOALS: Goals reviewed with patient? Yes  SHORT TERM GOALS: Target date: 10/21/2022      Patient will be independent with home exercise program for the left upper extremity Baseline: No current home exercise program Goal status: INITIAL  2.  Patient will independently formulate a full composite fist with the left hand.  Baseline: Second digit to the [facility]: 2cm.  Goal status: INITIAL              3: Pt. will improve active digit extension to be able to place her hand flat tabletop surface in preparation for weightbearing proprioceptive input. Baseline: Is unable to actively full digit extension Goal status: INITIAL   LONG TERM GOALS: Target date: 12/02/2022  Patient will improve active left shoulder flexion by 20 degrees preparation for reaching for the refrigerator door. Baseline: Eval: Left shoulder flexion 55(117) Goal status: INITIAL  2.  Patient will improve left shoulder active  abduction to be able to comb her hair Baseline: Eval: Left shoulder abduction is 67(108) Goal status: INITIAL  3.  Patient will patiently button her shirt with modified independence. Baseline: Eval: Patient requires increased time to complete Goal status: INITIAL  4.  Patient well improve left grip strength preparation for cutting food Baseline: Eval:  Goal status: INITIAL  5.  Pt. will independently recall adaptive  strategies for performing ADL tasks including: flossing teeth, donning bra, applying makeup. Baseline: Eval: Pt. unable to perform Goal status: INITIAL  6.  Pt. will improve Foto score by 5 points to reflect patient perceived performance improvement assessment specific ADLs  and IADLs Baseline: Eval: 45 Goal status: INITIAL    ASSESSMENT:  CLINICAL IMPRESSION:  Patient presents with no reports of pain.  Patient was able to tolerate manual therapy, therapeutic exercise, and neuromuscular reeducation well today.  Patient was able to stabilize her left shoulder with minimal perturbations, and was able to perform protraction with support at the elbow.  Patient required support and assist at her left elbow when performing horizontal abduction reps.  She required verbal cues and cues for visual demonstration of grasp patterns when grasping the [place] style discs, and using full digit extension to release them.  Patient required assist proximally at her elbow when reaching to the table and reaching up to place the discs.  Patient utilized grasp patterns incorporating her thumb second and third digits initially with cues, and visual demonstration.  During the third grasping task patient utilized a gross raking motion to grasp the [place] discs from the table before reaching up to place them into the container. Pt. Required rest breaks. Patient continues to work on proving left upper extremity functioning in order to improve engagement in, and maximize independence in ADLs and IADL  tasks.  PERFORMANCE DEFICITS in functional skills including ADLs, IADLs, coordination, proprioception, ROM, strength, [place], and [facility], cognitive skills including memory, and psychosocial skills including coping strategies, environmental adaptation, interpersonal interactions, and routines and behaviors.   IMPAIRMENTS are limiting patient from ADLs, IADLs, education, leisure, and social participation.   COMORBIDITIES may have co-morbidities  that affects occupational performance. Patient will benefit from  skilled OT to address above impairments and improve overall function.  MODIFICATION OR ASSISTANCE TO COMPLETE EVALUATION: Min-Moderate modification of tasks or assist with assess necessary to complete an evaluation.  OT OCCUPATIONAL PROFILE AND HISTORY: Detailed assessment: Review of records and additional review of physical, cognitive, psychosocial history related to current functional performance.  CLINICAL DECISION MAKING: Moderate - several treatment options, min-mod task modification necessary  REHAB POTENTIAL: Good  EVALUATION COMPLEXITY: Moderate    PLAN: OT FREQUENCY: 3x/week  OT DURATION: 12 weeks  PLANNED INTERVENTIONS: self care/ADL training, therapeutic exercise, therapeutic activity, neuromuscular re-education, manual therapy, passive range of motion, functional mobility training, electrical stimulation, and paraffin  RECOMMENDED OTHER SERVICES: PT  CONSULTED AND AGREED WITH PLAN OF CARE: Patient and family member/caregiver  PLAN FOR NEXT SESSION: Initiate OT treatment  Harrel Carina, MS, OTR/L   Harrel Carina, OT 09/11/2022, 4:35 PM

## 2022-09-12 DIAGNOSIS — Z79899 Other long term (current) drug therapy: Secondary | ICD-10-CM | POA: Diagnosis not present

## 2022-09-12 DIAGNOSIS — I10 Essential (primary) hypertension: Secondary | ICD-10-CM | POA: Diagnosis not present

## 2022-09-12 DIAGNOSIS — E781 Pure hyperglyceridemia: Secondary | ICD-10-CM | POA: Diagnosis not present

## 2022-09-12 DIAGNOSIS — E039 Hypothyroidism, unspecified: Secondary | ICD-10-CM | POA: Diagnosis not present

## 2022-09-12 DIAGNOSIS — Z8673 Personal history of transient ischemic attack (TIA), and cerebral infarction without residual deficits: Secondary | ICD-10-CM | POA: Diagnosis not present

## 2022-09-16 ENCOUNTER — Ambulatory Visit: Payer: Medicare HMO | Admitting: Occupational Therapy

## 2022-09-16 ENCOUNTER — Encounter: Payer: Self-pay | Admitting: Occupational Therapy

## 2022-09-16 DIAGNOSIS — I6381 Other cerebral infarction due to occlusion or stenosis of small artery: Secondary | ICD-10-CM | POA: Diagnosis not present

## 2022-09-16 DIAGNOSIS — R262 Difficulty in walking, not elsewhere classified: Secondary | ICD-10-CM | POA: Diagnosis not present

## 2022-09-16 DIAGNOSIS — M6281 Muscle weakness (generalized): Secondary | ICD-10-CM

## 2022-09-16 DIAGNOSIS — R278 Other lack of coordination: Secondary | ICD-10-CM | POA: Diagnosis not present

## 2022-09-16 DIAGNOSIS — R41841 Cognitive communication deficit: Secondary | ICD-10-CM | POA: Diagnosis not present

## 2022-09-16 NOTE — Therapy (Signed)
OUTPATIENT OCCUPATIONAL THERAPY NEURO TREATMENT NOTE  Patient Name: [name] MRN: [id number] DOB:16-Mar-1941, 81 y.o., female Today's Date: 09/16/2022  PCP: Dr. Doy Hutching REFERRING PROVIDER: Reesa Chew   OT End of Session - 09/16/22 1430     Visit Number 3    Number of Visits 73    Date for OT Re-Evaluation 12/02/22    Authorization Time Period Progress report period starting 09/09/2022    OT Start Time 1303    OT Stop Time 1345    OT Time Calculation (min) 42 min    Activity Tolerance Patient limited by pain    Behavior During Therapy [facility] for tasks assessed/performed             Past Medical History:  Diagnosis Date   Anemia    Arthritis    Asthma    uses inhaler just prior to surgery to avoid attack   Back pain    from previous injury   Complication of anesthesia    has woken  up during 2 different surgery   Depression    no current issue/treatment; situation   Gallstones    GERD (gastroesophageal reflux disease)    Hiatal hernia    patient does NOT have nerve/muscle disease   History of kidney stones    HLD (hyperlipidemia)    HTN (hypertension)    Hypothyroidism    Kidney stones    Knee pain    Non-diabetic pancreatic hormone dysfunction years   pt. 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   Pancreatitis    Pneumonia    Seizures ([place])    caused by dye injected during a procedure   Shortness of breath    with exertion   Sinus problem    frequent infections/congestion   Stroke ([place]) 2021   reports having CVA in 2021 and having mini strokes before that   Thyroid disease    Past Surgical History:  Procedure Laterality Date   ABDOMINAL HYSTERECTOMY     APPENDECTOMY     CARPAL TUNNEL RELEASE  10+ years ago   bilateral   EYE SURGERY  3 yrs ago   bilateral cataracts   FOOT OSTEOTOMY  6 weeks ago   Left foot: great, 2nd & 3rd   FOOT OSTEOTOMY  5 years ago   Right great toe   HAND SURGERY Bilateral 2011-most recent   multiple hand surgeries, 2  on left, 3 on right   KNEE ARTHROPLASTY Right 04/28/2022   Procedure: COMPUTER ASSISTED TOTAL KNEE ARTHROPLASTY;  Surgeon: Dereck Leep, MD;  Location: [facility];  Service: Orthopedics;  Laterality: Right;   LOOP RECORDER INSERTION N/A 05/16/2020   Procedure: LOOP RECORDER INSERTION;  Surgeon: Isaias Cowman, MD;  Location: [facility];  Service: Cardiovascular;  Laterality: N/A;   NASAL SINUS SURGERY  most recent 7-8 yrs ago   7 sinus surgeries    TRIGGER FINGER RELEASE  11/19/2011   Procedure: RELEASE TRIGGER FINGER/A-1 PULLEY;  Surgeon: Wynonia Sours, MD;  Location: [facility];  Service: Orthopedics;  Laterality: Right;  release a-1 pulley right index finger and cyst removal   WRIST GANGLION EXCISION  1980's   right   Patient Active Problem List   Diagnosis Date Noted   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   GERD (gastroesophageal reflux disease) 08/21/2022   Agitation 08/20/2022   Acute left-sided weakness 08/20/2022   Expressive aphasia    Stroke ([place]) 08/19/2022   Leukocytosis 08/19/2022   History of urticaria  04/28/2022   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   Primary osteoarthritis of left knee 02/24/2022   Primary osteoarthritis of right knee 02/24/2022   Lumbar spondylolysis 04/12/2020   History of CVA (cerebrovascular accident) 03/26/2020   Low back pain radiating to right lower extremity 03/21/2020   B12 deficiency 03/06/2020   Positive anti-CCP test 12/21/2019   Arthralgia 12/13/2019   Dermatitis 12/13/2019   Rheumatoid factor positive 12/13/2019   Essential hypertension 12/11/2018   Palpitations 12/11/2018   Acquired hypothyroidism 11/10/2018   Arthritis of knee 09/17/2016   Anxiety 11/22/2014   Asthma without status asthmaticus 11/22/2014   Benign neoplasm of colon, unspecified 11/22/2014   Environmental allergies 11/22/2014   Hypertriglyceridemia 11/22/2014   Hypokalemia 11/22/2014   Personal history of disease of skin and  subcutaneous tissue 11/22/2014    ONSET DATE: 08/18/2022  REFERRING DIAG: CVA  THERAPY DIAG:  Muscle weakness (generalized)  Rationale for Evaluation and Treatment Rehabilitation  SUBJECTIVE:   SUBJECTIVE STATEMENT:   Pt. Reports having a follow-up appointment with her Neurologist tomorrow.  Pt accompanied by: significant other  PERTINENT HISTORY: Patient is a 81 year old female who was diagnosed with a right thalamic CVA on 08/18/2022 with left-sided weakness.  Patient underwent inpatient rehabilitation for 2 weeks.  Patient was assessed and was scheduled for a knee replacement on 08/29/2022 however had to cancel it due to having had a CVA.  Patient had a recent fall 2 days after discharging from inpatient rehab. past medical history includes: Knee replacement, essential HTN, hypokalemia, leukocytosis, seizures, positive anti-- CCP test, anxiety disorder, mini strokes.  Patient had shingles with left eye nerve pain s/p 1 year ago.   PRECAUTIONS: Fall  WEIGHT BEARING RESTRICTIONS No  PAIN:  Are you having pain? No  FALLS: Has patient fallen in last 6 months? Yes. Number of falls 1  LIVING ENVIRONMENT: Lives with: Lives with Spouse Lives in: House/apartment Stairs: 2 storey home, resides on the first floor.  External: 2 stairs front no rails, and 6 in back with rails Has following equipment at home: Single point cane, Walker - 2 wheeled, Environmental consultant - 4 wheeled, Shower bench, and bed side commode  PLOF: Independent  PATIENT GOALS  Pt. Regain the use of her left arm  OBJECTIVE:   HAND DOMINANCE: Right  ADLs: Overall ADLs: Husband assists pt. as needed Transfers/ambulation related to ADLs:Pt. Has assistive devices. Eating: Pt. Is independent with the right hand. Pt. Is unable to cut food, and open packets Grooming: Pt. Is using her right hand, however has difficulty using her left to assist with haircare UB Dressing: Pt. Is independent donning a pullover shirt, and button down  shirt. Has difficulty with buttoning, LB Dressing:  Independent donning pants, and socks. Difficulty tying shoes. Toileting: Independent Bathing: independent using the right hand, difficulty using the LUE for the right side. Tub Shower transfers: Supervision Equipment: See above for equipment   IADLs: Shopping:  Has not had the opportunity for grocery shopping yet Light housekeeping: Husband assisting with light house keeping Meal Prep:  Dependent Community mobility: Relies of family/friends Medication management: Husband Oncologist: TBD Handwriting: 75% legible  MOBILITY STATUS: Hx of falls  POSTURE COMMENTS:  No Significant postural limitations Sitting balance: supported sitting balance WFL  ACTIVITY TOLERANCE: Activity tolerance:  Fatigues with 10-15 min.  FUNCTIONAL OUTCOME MEASURES: FOTO: 45  UPPER EXTREMITY ROM     Active ROM Right Eval: WFL Left eval  Shoulder flexion  55(117)  Shoulder abduction  67(108  Shoulder  adduction    Shoulder extension    Shoulder internal rotation    Shoulder external rotation    Elbow flexion  122(144)  Elbow extension  -16(0)  Wrist flexion  62  Wrist extension  -22(50)  Wrist ulnar deviation    Wrist radial deviation    Wrist pronation    Wrist supination    (Blank rows = not tested)  Left digit flexion to [facility]: 2nd: 2cm, 3rd: 0cm, 4th: 0cm, 5th: 0cm   Limited Left full digit extension   UPPER EXTREMITY MMT:     MMT Right Eval: 4+/5 overall Left eval  Shoulder flexion  3-/5  Shoulder abduction  3-/5  Shoulder adduction    Shoulder extension    Shoulder internal rotation    Shoulder external rotation    Middle trapezius    Lower trapezius    Elbow flexion  3+/5  Elbow extension  3+/5  Wrist flexion    Wrist extension  2-/5  Wrist ulnar deviation    Wrist radial deviation    Wrist pronation    Wrist supination    (Blank rows = not tested)  HAND FUNCTION: Left Unable  COORDINATION:   Impaired  SENSATION: Light touch: WFL, proprioceptive awareness: Intact  EDEMA: N/A  MUSCLE TONE: LUE: Hypotonic  COGNITION: Overall cognitive status: Within functional limits for tasks assessed Pt. Reports memory limitations.  Patient has bilateral hearing aids however was not wearing them during the initial evaluation.  VISION: Subjective report: Pt. report having shingles affecting left eye  s/p 1 year. Has nerve pain Baseline vision: Wears glasses for reading only Visual history: Report no changes  VISION ASSESSMENT:   WFL for tasks performed  PERCEPTION: Intact  PRAXIS: Impaired: Motor planning  OBSERVATIONS: Patient reports being sore following a recent fall after discharging from inpatient rehab.   TODAY'S TREATMENT:    Manual therapy:   Patient tolerated scapular mobilizations for elevation, depression, and abduction, and rotation in sitting.  Manual therapy was performed independent of hand in preparation for there ex and neuromuscular reeducation.   Neuromuscular education:   Patient worked on weightbearing and proprioception through the left upper extremity and hand in sitting, and in sidelying to normalize tone and prepare the upper extremity for range of motion, and functional use.  Pt. worked on gross grasping for cylindrical cones, followed by active releasing with wrist, and digit extension reps, and finger flicks with exaggerated digit abduction, and extension between each rep.    Therapeutic exercise:   Patient tolerated passive range of motion in all joint ranges of the left upper extremity in supine, followed by active range of motion reps.  Patient worked on shoulder left shoulder stabilization exercises fine with her shoulder flexed at 90 degrees with elbow in full extension.  Patient worked on stabilizing her shoulder with perturbations, as well as protraction. Pt. Worked on bilateral shoulder flexion exercises while stabilizing a ball with flat hands.  Pt. performed chest presses with the ball, followed by a 1# dowel. Emphasis was placed on keeping the dowel symmetrical between the right, and left UEs. Pt. Required minimal support to the left side of the dowel.Patient worked on horizontal adduction, and abduction combining wrist extension when reaching across midline for her opposite shoulder. Resistance was applied to shoulder abduction, and elbow extension.  Pt. Worked on wrist, and digit extension reps with tapping at the forearm extensors to facilitate movement. Pt. Worked on gross gripping using a gripper with red level resistive band. Pt. Worked  on holding and stabilizing a cylindrical cone while it is attempted to be removed by an outside force.   PATIENT EDUCATION: Education details: OT services, POC, and goals. Person educated: Patient and Spouse Education method: Customer service manager Education comprehension: verbalized understanding, returned demonstration, and needs further education   HOME EXERCISE PROGRAM:  Assess ongoing need for HEP    GOALS: Goals reviewed with patient? Yes  SHORT TERM GOALS: Target date: 10/21/2022      Patient will be independent with home exercise program for the left upper extremity Baseline: No current home exercise program Goal status: INITIAL  2.  Patient will independently formulate a full composite fist with the left hand.  Baseline: Second digit to the [facility]: 2cm.  Goal status: INITIAL              3: Pt. will improve active digit extension to be able to place her hand flat tabletop surface in preparation for weightbearing proprioceptive input. Baseline: Is unable to actively full digit extension Goal status: INITIAL   LONG TERM GOALS: Target date: 12/02/2022  Patient will improve active left shoulder flexion by 20 degrees preparation for reaching for the refrigerator door. Baseline: Eval: Left shoulder flexion 55(117) Goal status: INITIAL  2.  Patient will improve left  shoulder active abduction to be able to comb her hair Baseline: Eval: Left shoulder abduction is 67(108) Goal status: INITIAL  3.  Patient will patiently button her shirt with modified independence. Baseline: Eval: Patient requires increased time to complete Goal status: INITIAL  4.  Patient well improve left grip strength preparation for cutting food Baseline: Eval:  Goal status: INITIAL  5.  Pt. will independently recall adaptive  strategies for performing ADL tasks including: flossing teeth, donning bra, applying makeup. Baseline: Eval: Pt. unable to perform Goal status: INITIAL  6.  Pt. will improve Foto score by 5 points to reflect patient perceived performance improvement assessment specific ADLs  and IADLs Baseline: Eval: 45 Goal status: INITIAL    ASSESSMENT:  CLINICAL IMPRESSION:  Patient presents with no reports of pain, however reports being tired and sleeping more. Pt. Is making progress since the last session. Pt. Reports that she has been engaging her LUE/hand in new tasks at home including squeezing toothpaste, and opening lip stick container Pt. was able to stabilize her left shoulder more during perturbations, required less support proximally during stabilization excises, and was able to tolerate weight for shoulder flexion, and chest presses. Pt. Was able to tolerate resistance with elbow extension., and horizontal abduction. Pt. Tolerated LUE, and forearm weightbearing well. Patient continues to work on proving left upper extremity strength, wrist and digit extension, and overall  LUE functioning in order to improve engagement in, and maximize independence in ADLs and IADL tasks.  PERFORMANCE DEFICITS in functional skills including ADLs, IADLs, coordination, proprioception, ROM, strength, [place], and [facility]MC, cognitive skills including memory, and psychosocial skills including coping strategies, environmental adaptation, interpersonal interactions, and routines and behaviors.    IMPAIRMENTS are limiting patient from ADLs, IADLs, education, leisure, and social participation.   COMORBIDITIES may have co-morbidities  that affects occupational performance. Patient will benefit from skilled OT to address above impairments and improve overall function.  MODIFICATION OR ASSISTANCE TO COMPLETE EVALUATION: Min-Moderate modification of tasks or assist with assess necessary to complete an evaluation.  OT OCCUPATIONAL PROFILE AND HISTORY: Detailed assessment: Review of records and additional review of physical, cognitive, psychosocial history related to current functional performance.  CLINICAL DECISION MAKING: Moderate - several  treatment options, min-mod task modification necessary  REHAB POTENTIAL: Good  EVALUATION COMPLEXITY: Moderate    PLAN: OT FREQUENCY: 3x/week  OT DURATION: 12 weeks  PLANNED INTERVENTIONS: self care/ADL training, therapeutic exercise, therapeutic activity, neuromuscular re-education, manual therapy, passive range of motion, functional mobility training, electrical stimulation, and paraffin  RECOMMENDED OTHER SERVICES: PT  CONSULTED AND AGREED WITH PLAN OF CARE: Patient and family member/caregiver  PLAN FOR NEXT SESSION: Initiate OT treatment  Harrel Carina, MS, OTR/L   Harrel Carina, OT 09/16/2022, 2:37 PM

## 2022-09-16 NOTE — Progress Notes (Unsigned)
[name] is a 81 y.o. female 81 year old female with history of HTN, seizures, positive anti-CCP test, anxiety disorder, mini strokes who presents to PM&R clinic as a transition of care s/p [facility]PR admission 9/15-9/29/30 for R thalamic stroke.  Briefly, she was admitted on 08/19/2022 with acute onset LUE weakness and expressive deficits.  MRI brain done revealing acute infarct in right corona radiata and small infarct in right anterior thalamus.  Neurology felt that stroke was due to small vessel disease and recommended DAPT x3 weeks followed by ASA alone. Hospitalization c/b HA with Tx Toradol, bouts of agitation reported due to claustrophobia. She was admitted to rehab 08/22/2022 for inpatient therapies to consist of PT, ST and OT. Her headaches had greatly improved therefore tramadol was discontinued. Rehab course s/b hypertension, for which amlodipine was increased to 10 mg with improvement in BP control overall. Follow-up check of BMET showed mild AKI which had resolved with increase in fluid intake.  K-Dur was added to supplement hypokalemia. Follow-up CBC during her stay showed recurrent mild rise in WBC but no signs or symptoms of infection reported.  At discharge, She is able to complete ADL tasks with supervision.  She requires supervision for transfers and to ambulate 150 feet with use of rollator.  She requires supervision to climb 12 stairs.  She is tolerating regular textures with minimal overt S/S of aspiration.  She is able to use safe swallow strategies at modified independent level.  She requires supervision for functional problem-solving and to complete mildly complex tasks.   Interval Hx:

## 2022-09-17 ENCOUNTER — Encounter: Payer: Self-pay | Admitting: Physical Medicine and Rehabilitation

## 2022-09-17 ENCOUNTER — Encounter: Payer: Medicare HMO | Attending: Physical Medicine and Rehabilitation | Admitting: Physical Medicine and Rehabilitation

## 2022-09-17 VITALS — BP 115/72 | HR 79 | Ht 59.0 in | Wt 114.6 lb

## 2022-09-17 DIAGNOSIS — R5383 Other fatigue: Secondary | ICD-10-CM | POA: Diagnosis not present

## 2022-09-17 DIAGNOSIS — G8194 Hemiplegia, unspecified affecting left nondominant side: Secondary | ICD-10-CM | POA: Insufficient documentation

## 2022-09-17 DIAGNOSIS — I6381 Other cerebral infarction due to occlusion or stenosis of small artery: Secondary | ICD-10-CM | POA: Diagnosis not present

## 2022-09-17 NOTE — Assessment & Plan Note (Signed)
Deficits include mild dysarthria, cognitive/language deficits, and LUE> LLE hemiparesis.  Continue OP PT, OT, SLP services.   No equipment needs

## 2022-09-17 NOTE — Progress Notes (Signed)
Subjective:    Patient ID: [name], female    DOB: [birth year], 81 y.o.   MRN: [id number]  HPI  [name] is a 81 y.o. female 81 year old female with history of HTN, seizures, positive anti-CCP test, anxiety disorder, mini strokes who presents to PM&R clinic as a transition of care s/p IPR admission 9/15-9/29/30 for R thalamic stroke.  Briefly, she was admitted on 08/19/2022 with acute onset LUE weakness and expressive deficits.  MRI brain done revealing acute infarct in right corona radiata and small infarct in right anterior thalamus.  Neurology felt that stroke was due to small vessel disease and recommended DAPT x3 weeks followed by ASA alone. Hospitalization c/b HA with Tx Toradol, bouts of agitation reported due to claustrophobia. She was admitted to rehab 08/22/2022 for inpatient therapies to consist of PT, ST and OT. Her headaches had greatly improved therefore tramadol was discontinued. Rehab course s/b hypertension, for which amlodipine was increased to 10 mg with improvement in BP control overall. Follow-up check of BMET showed mild AKI which had resolved with increase in fluid intake.  K-Dur was added to supplement hypokalemia. Follow-up CBC during her stay showed recurrent mild rise in WBC but no signs or symptoms of infection reported.  At discharge, She is able to complete ADL tasks with supervision.  She requires supervision for transfers and to ambulate 150 feet with use of rollator.  She requires supervision to climb 12 stairs.  She is tolerating regular textures with minimal overt S/S of aspiration.  She is able to use safe swallow strategies at modified independent level.  She requires supervision for functional problem-solving and to complete mildly complex tasks.   Interval Hx:   - Pt states she started OP rehab at [facility] (OT; to start PT, SLP soon). Doing well. No equipment needs. No near falls. BP staying in excellent range.   - Pt explains she still has a lot of  physical fatigue and is falling asleep a lot. She sleeps very well at night, which is unusual for her. She feels refreshed when she first wakes up during the day, but shortly after will fall asleep if she stays still. She states she is usually "go go go" and is not used to being tired. She denies headaches or cognitive fatigue. Husband endorses rare snoring but no sleep apnea.   -  She notes she had a migraine and took 2x aspirin 325 mg because she was worried about getting another stroke and had numbness/tingling in the same distribution; after aspirin, symptoms resolved. She mentions she had migraines pre-existing and had the same distribution/symptoms as her stroke. She was never evaluated for these and incidentally noted they coincided with high blood pressure prior to her stroke.   - Husband asks about pulsed electromagnetic therapy for her post-herpetic neuralgia  Pain Inventory Average Pain 0 Pain Right Now 0 My pain is  no pain  BOWEL Number of stools per week: 2-3 Oral laxative use Yes  Type of laxative Miralax Enema or suppository use No  History of colostomy No  Incontinent No   BLADDER Normal  Mobility walk with assistance use a walker how many minutes can you walk? 5 ability to climb steps?  yes do you drive?  no  Function retired I need assistance with the following:  meal prep, household duties, and shopping  Neuro/Psych weakness trouble walking  Prior Studies Any changes since last visit?  yes --going to outpt therapies  Physicians involved in  your care Any changes since last visit?  no  Has seen primary care, he felt she should still be on Plavix but directions were to stop after 10/3.  She has appt scheduled to see neuro   Family History  Problem Relation Age of Onset   Anesthesia problems Father        "bad lungs" couldn't wake him up   Heart disease Father    Heart attack Father 61   Breast cancer Maternal Grandmother    Breast cancer Maternal  Aunt        x 2   Breast cancer Cousin    Pancreatic cancer Cousin    Heart attack Paternal Uncle    Stroke Paternal Grandfather    Social History   Socioeconomic History   Marital status: Married    Spouse name: [name]   Number of children: 1   Years of education: Not on file   Highest education level: Not on file  Occupational History   Occupation: retired  Tobacco Use   Smoking status: Never   Smokeless tobacco: Never  Vaping Use   Vaping Use: Never used  Substance and Sexual Activity   Alcohol use: Not Currently    Alcohol/week: 0.0 - 1.0 standard drinks of alcohol    Comment: wine once every 2-3 months   Drug use: Never   Sexual activity: Not on file  Other Topics Concern   Not on file  Social History Narrative   Not on file   Social Determinants of Health   Financial Resource Strain: Not on file  Food Insecurity: Not on file  Transportation Needs: Not on file  Physical Activity: Not on file  Stress: Not on file  Social Connections: Not on file   Past Surgical History:  Procedure Laterality Date   ABDOMINAL HYSTERECTOMY     APPENDECTOMY     CARPAL TUNNEL RELEASE  10+ years ago   bilateral   EYE SURGERY  3 yrs ago   bilateral cataracts   FOOT OSTEOTOMY  6 weeks ago   Left foot: great, 2nd & 3rd   FOOT OSTEOTOMY  5 years ago   Right great toe   HAND SURGERY Bilateral 2011-most recent   multiple hand surgeries, 2 on left, 3 on right   KNEE ARTHROPLASTY Right 04/28/2022   Procedure: COMPUTER ASSISTED TOTAL KNEE ARTHROPLASTY;  Surgeon: Dereck Leep, MD;  Location: [facility];  Service: Orthopedics;  Laterality: Right;   LOOP RECORDER INSERTION N/A 05/16/2020   Procedure: LOOP RECORDER INSERTION;  Surgeon: Isaias Cowman, MD;  Location: [place] [facility];  Service: Cardiovascular;  Laterality: N/A;   NASAL SINUS SURGERY  most recent 7-8 yrs ago   7 sinus surgeries    TRIGGER FINGER RELEASE  11/19/2011   Procedure: RELEASE TRIGGER FINGER/A-1 PULLEY;   Surgeon: Wynonia Sours, MD;  Location: [place];  Service: Orthopedics;  Laterality: Right;  release a-1 pulley right index finger and cyst removal   WRIST GANGLION EXCISION  1980's   right   Past Medical History:  Diagnosis Date   Anemia    Arthritis    Asthma    uses inhaler just prior to surgery to avoid attack   Back pain    from previous injury   Complication of anesthesia    has woken  up during 2 different surgery   Depression    no current issue/treatment; situation   Gallstones    GERD (gastroesophageal reflux disease)  Hiatal hernia    patient does NOT have nerve/muscle disease   History of kidney stones    HLD (hyperlipidemia)    HTN (hypertension)    Hypothyroidism    Kidney stones    Knee pain    Non-diabetic pancreatic hormone dysfunction years   pt. 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   Pancreatitis    Pneumonia    Seizures ([place])    caused by dye injected during a procedure   Shortness of breath    with exertion   Sinus problem    frequent infections/congestion   Stroke [facility]) 2021   reports having CVA in 2021 and having mini strokes before that   Thyroid disease    BP 115/72   Pulse 79   Ht '4\' 11"'$  (1.499 m)   Wt 114 lb 9.6 oz (52 kg)   SpO2 92%   BMI 23.15 kg/m   Opioid Risk Score:   Fall Risk Score:  `1  Depression screen [facility] 2/9     09/17/2022    1:49 PM  Depression screen PHQ 2/9  Decreased Interest 0  Down, Depressed, Hopeless 0  PHQ - 2 Score 0  Altered sleeping 3  Tired, decreased energy 3  Change in appetite 0  Feeling bad or failure about yourself  0  Trouble concentrating 0  Moving slowly or fidgety/restless 0  Suicidal thoughts 0  PHQ-9 Score 6  Difficult doing work/chores Somewhat difficult     Review of Systems  Constitutional: Negative.   HENT: Negative.    Eyes: Negative.   Respiratory: Negative.    Cardiovascular: Negative.   Gastrointestinal: Negative.   Endocrine: Negative.    Genitourinary: Negative.   Musculoskeletal:  Positive for gait problem.  Skin: Negative.   Allergic/Immunologic: Negative.   Neurological:  Positive for weakness.       Tingling in hands sometime but has hx of ?Raynauds, hands are dusky  Hematological: Negative.   Psychiatric/Behavioral:  Positive for dysphoric mood.   All other systems reviewed and are negative.      Objective:   Physical Exam  Constitutional: No apparent distress. Appropriate appearance for age.  HENT: No JVD. Neck Supple. Trachea midline. Atraumatic, normocephalic. Eyes: PERRLA. EOMI. Visual fields grossly intact.  Cardiovascular: RRR, no murmurs/rub/gallops. No Edema. Peripheral pulses 2+  Respiratory: CTAB. No rales, rhonchi, or wheezing. On RA.  Abdomen: + bowel sounds, normoactive. No distention or tenderness.  Skin: C/D/I. No apparent lesions. MSK:      No apparent deformity. Difficulty with left thumb abduction.      Strength:                RUE: 5/5 SA, 5/5 EF, 5/5 EE, 5/5 WE, 5/5 FF, 5/5 FA                 LUE: 4+/5 SA, 4+/5 EF, 4+/5 EE, 4-/5 WE, 4-/5 FF, 4-/5 FA                 RLE: 5/5 HF, 5/5 KE, 5/5 DF, 5/5 EHL, 5/5 PF                 LLE:  5/5 HF, 5/5 KE, 5/5 DF, 5/5 EHL, 5/5 PF   Neurologic exam:  Cognition: AAO to person, place, time and event.  Language: Fluent, No substitutions or neoglisms. No dysarthria.  Mood: Pleasant affect, appropriate mood.  Sensation: To light touch intact in BL UEs and LEs  Reflexes: 2+ in BL UE and  LEs. + LUE Hoffman's CN: + L facial droop. Otherwise 2-12 intact. Coordination: No apparent tremors. No ataxia on FTN, HTS bilaterally.  Spasticity: MAS 0 in all extremities.  Gait: Good cadence, stance and stride with rolling walker.          Assessment & Plan:   [name] is a 81 y.o. female 81 year old female with history of HTN, seizures, positive anti-CCP test, anxiety disorder, mini strokes who presents to PM&R clinic as a transition of care s/p IPR  admission 9/15-9/29/30 for R thalamic stroke. Today, discussed etiology and sequela of her stroke, ongoing migraines vs. TIAs, and fatigue.  Fatigue, unspecified type Assessment & Plan: Post-hospitalization  Vs. Medication side effect, most likely from Gabapentin.  Will discuss with neurology transition to alternate medication for her post-herpetic neuralgia, such as elavil or carbamazepine.    Right thalamic stroke [facility]) Assessment & Plan: Deficits include mild dysarthria, cognitive/language deficits, and LUE> LLE hemiparesis.  Continue OP PT, OT, SLP services.   No equipment needs   Left hemiparesis (HCC) Assessment & Plan: Intermittent worsening of numbness/tingling on L side with migraines vs. TIAs; pre-existed stroke and thus far treated with aspirin.  Urged patient to discuss this at upcoming neurology appointment to help guide treatment regimen, especially given her PCP's concern for coming off of plavix.    If symptoms occur again prior to then, did state full dose aspirin is appropriate and to seek urgent medical care if symptoms do not resolve quickly.   Did discuss potential for development of tone in LUE and advised to maintain mobility, stretching to prevent contractures.       Gertie Gowda, DO 09/17/2022

## 2022-09-17 NOTE — Assessment & Plan Note (Signed)
Post-hospitalization  Vs. Medication side effect, most likely from Gabapentin.  Will discuss with neurology transition to alternate medication for her post-herpetic neuralgia, such as elavil or carbamazepine.

## 2022-09-17 NOTE — Assessment & Plan Note (Addendum)
Intermittent worsening of numbness/tingling on L side with migraines vs. TIAs; pre-existed stroke and thus far treated with aspirin.  Urged patient to discuss this at upcoming neurology appointment to help guide treatment regimen, especially given her PCP's concern for coming off of plavix.    If symptoms occur again prior to then, did state full dose aspirin is appropriate and to seek urgent medical care if symptoms do not resolve quickly.   Did discuss potential for development of tone in LUE and advised to maintain mobility, stretching to prevent contractures.

## 2022-09-17 NOTE — Patient Instructions (Addendum)
I will discuss with your neurologist trialling a different medication for your post-herpetic neuralgia, given ongoing issues with daytime fatigue.  Please discuss at your neurology follow up management of antiplatelet medications moving forward, and how to address migraines vs. TIAs. If you get symptoms similar to your stroke, in the interim please take a full dose aspirin and seek emergency care if symptoms do not resolve quickly (within 1-2 hours).   Follow up with me in 1 month to review progress with therapies and ongoing fatigue.

## 2022-09-18 ENCOUNTER — Ambulatory Visit: Payer: Medicare HMO | Admitting: Occupational Therapy

## 2022-09-18 ENCOUNTER — Encounter: Payer: Self-pay | Admitting: Occupational Therapy

## 2022-09-18 DIAGNOSIS — R262 Difficulty in walking, not elsewhere classified: Secondary | ICD-10-CM | POA: Diagnosis not present

## 2022-09-18 DIAGNOSIS — I6381 Other cerebral infarction due to occlusion or stenosis of small artery: Secondary | ICD-10-CM | POA: Diagnosis not present

## 2022-09-18 DIAGNOSIS — R41841 Cognitive communication deficit: Secondary | ICD-10-CM | POA: Diagnosis not present

## 2022-09-18 DIAGNOSIS — M6281 Muscle weakness (generalized): Secondary | ICD-10-CM | POA: Diagnosis not present

## 2022-09-18 DIAGNOSIS — R278 Other lack of coordination: Secondary | ICD-10-CM | POA: Diagnosis not present

## 2022-09-18 NOTE — Therapy (Signed)
OUTPATIENT OCCUPATIONAL THERAPY NEURO TREATMENT NOTE  Patient Name: [name] MRN: [id number] DOB:Jun 19, 1941, 81 y.o., female Today's Date: 09/18/2022  PCP: Dr. Doy Hutching REFERRING PROVIDER: Reesa Chew   OT End of Session - 09/18/22 1718     Visit Number 4    Number of Visits 13    Date for OT Re-Evaluation 12/02/22    Authorization Time Period Progress report period starting 09/09/2022    OT Start Time 1355    OT Stop Time 1445    OT Time Calculation (min) 50 min    Activity Tolerance Patient limited by pain    Behavior During Therapy [facility] for tasks assessed/performed             Past Medical History:  Diagnosis Date   Anemia    Arthritis    Asthma    uses inhaler just prior to surgery to avoid attack   Back pain    from previous injury   Complication of anesthesia    has woken  up during 2 different surgery   Depression    no current issue/treatment; situation   Gallstones    GERD (gastroesophageal reflux disease)    Hiatal hernia    patient does NOT have nerve/muscle disease   History of kidney stones    HLD (hyperlipidemia)    HTN (hypertension)    Hypothyroidism    Kidney stones    Knee pain    Non-diabetic pancreatic hormone dysfunction years   pt. 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   Pancreatitis    Pneumonia    Seizures (HCC)    caused by dye injected during a procedure   Shortness of breath    with exertion   Sinus problem    frequent infections/congestion   Stroke ([place]) 2021   reports having CVA in 2021 and having mini strokes before that   Thyroid disease    Past Surgical History:  Procedure Laterality Date   ABDOMINAL HYSTERECTOMY     APPENDECTOMY     CARPAL TUNNEL RELEASE  10+ years ago   bilateral   EYE SURGERY  3 yrs ago   bilateral cataracts   FOOT OSTEOTOMY  6 weeks ago   Left foot: great, 2nd & 3rd   FOOT OSTEOTOMY  5 years ago   Right great toe   HAND SURGERY Bilateral 2011-most recent   multiple hand surgeries, 2  on left, 3 on right   KNEE ARTHROPLASTY Right 04/28/2022   Procedure: COMPUTER ASSISTED TOTAL KNEE ARTHROPLASTY;  Surgeon: Dereck Leep, MD;  Location: [facility];  Service: Orthopedics;  Laterality: Right;   LOOP RECORDER INSERTION N/A 05/16/2020   Procedure: LOOP RECORDER INSERTION;  Surgeon: Isaias Cowman, MD;  Location: [place] [facility];  Service: Cardiovascular;  Laterality: N/A;   NASAL SINUS SURGERY  most recent 7-8 yrs ago   7 sinus surgeries    TRIGGER FINGER RELEASE  11/19/2011   Procedure: RELEASE TRIGGER FINGER/A-1 PULLEY;  Surgeon: Wynonia Sours, MD;  Location: [place];  Service: Orthopedics;  Laterality: Right;  release a-1 pulley right index finger and cyst removal   WRIST GANGLION EXCISION  1980's   right   Patient Active Problem List   Diagnosis Date Noted   Fatigue 09/17/2022   Left hemiparesis ([place]) 09/17/2022   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   GERD (gastroesophageal reflux disease) 08/21/2022   Agitation 08/20/2022   Acute left-sided weakness 08/20/2022   Expressive aphasia    Stroke ([place])  08/19/2022   Leukocytosis 08/19/2022   History of urticaria 04/28/2022   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   Primary osteoarthritis of left knee 02/24/2022   Primary osteoarthritis of right knee 02/24/2022   Lumbar spondylolysis 04/12/2020   History of CVA (cerebrovascular accident) 03/26/2020   Low back pain radiating to right lower extremity 03/21/2020   B12 deficiency 03/06/2020   Positive anti-CCP test 12/21/2019   Arthralgia 12/13/2019   Dermatitis 12/13/2019   Rheumatoid factor positive 12/13/2019   Essential hypertension 12/11/2018   Palpitations 12/11/2018   Acquired hypothyroidism 11/10/2018   Arthritis of knee 09/17/2016   Anxiety 11/22/2014   Asthma without status asthmaticus 11/22/2014   Benign neoplasm of colon, unspecified 11/22/2014   Environmental allergies 11/22/2014   Hypertriglyceridemia 11/22/2014    Hypokalemia 11/22/2014   Personal history of disease of skin and subcutaneous tissue 11/22/2014    ONSET DATE: 08/18/2022  REFERRING DIAG: CVA  THERAPY DIAG:  Muscle weakness (generalized)  Rationale for Evaluation and Treatment Rehabilitation  SUBJECTIVE:   SUBJECTIVE STATEMENT:   Pt. Reports having a follow-up appointment with her Neurologist tomorrow.  Pt accompanied by: significant other  PERTINENT HISTORY: Patient is a 81 year old female who was diagnosed with a right thalamic CVA on 08/18/2022 with left-sided weakness.  Patient underwent inpatient rehabilitation for 2 weeks.  Patient was assessed and was scheduled for a knee replacement on 08/29/2022 however had to cancel it due to having had a CVA.  Patient had a recent fall 2 days after discharging from inpatient rehab. past medical history includes: Knee replacement, essential HTN, hypokalemia, leukocytosis, seizures, positive anti-- CCP test, anxiety disorder, mini strokes.  Patient had shingles with left eye nerve pain s/p 1 year ago.   PRECAUTIONS: Fall  WEIGHT BEARING RESTRICTIONS No  PAIN:  Are you having pain? No  FALLS: Has patient fallen in last 6 months? Yes. Number of falls 1  LIVING ENVIRONMENT: Lives with: Lives with Spouse Lives in: House/apartment Stairs: 2 storey home, resides on the first floor.  External: 2 stairs front no rails, and 6 in back with rails Has following equipment at home: Single point cane, Walker - 2 wheeled, Environmental consultant - 4 wheeled, Shower bench, and bed side commode  PLOF: Independent  PATIENT GOALS  Pt. Regain the use of her left arm  OBJECTIVE:   HAND DOMINANCE: Right  ADLs: Overall ADLs: Husband assists pt. as needed Transfers/ambulation related to ADLs:Pt. Has assistive devices. Eating: Pt. Is independent with the right hand. Pt. Is unable to cut food, and open packets Grooming: Pt. Is using her right hand, however has difficulty using her left to assist with haircare UB  Dressing: Pt. Is independent donning a pullover shirt, and button down shirt. Has difficulty with buttoning, LB Dressing:  Independent donning pants, and socks. Difficulty tying shoes. Toileting: Independent Bathing: independent using the right hand, difficulty using the LUE for the right side. Tub Shower transfers: Supervision Equipment: See above for equipment   IADLs: Shopping:  Has not had the opportunity for grocery shopping yet Light housekeeping: Husband assisting with light house keeping Meal Prep:  Dependent Community mobility: Relies of family/friends Medication management: Husband Oncologist: TBD Handwriting: 75% legible  MOBILITY STATUS: Hx of falls  POSTURE COMMENTS:  No Significant postural limitations Sitting balance: supported sitting balance WFL  ACTIVITY TOLERANCE: Activity tolerance:  Fatigues with 10-15 min.  FUNCTIONAL OUTCOME MEASURES: FOTO: 45  UPPER EXTREMITY ROM     Active ROM Right Eval: WFL Left eval  Shoulder  flexion  55(117)  Shoulder abduction  67(108  Shoulder adduction    Shoulder extension    Shoulder internal rotation    Shoulder external rotation    Elbow flexion  122(144)  Elbow extension  -16(0)  Wrist flexion  62  Wrist extension  -22(50)  Wrist ulnar deviation    Wrist radial deviation    Wrist pronation    Wrist supination    (Blank rows = not tested)  Left digit flexion to [facility]: 2nd: 2cm, 3rd: 0cm, 4th: 0cm, 5th: 0cm   Limited Left full digit extension   UPPER EXTREMITY MMT:     MMT Right Eval: 4+/5 overall Left eval  Shoulder flexion  3-/5  Shoulder abduction  3-/5  Shoulder adduction    Shoulder extension    Shoulder internal rotation    Shoulder external rotation    Middle trapezius    Lower trapezius    Elbow flexion  3+/5  Elbow extension  3+/5  Wrist flexion    Wrist extension  2-/5  Wrist ulnar deviation    Wrist radial deviation    Wrist pronation    Wrist supination     (Blank rows = not tested)  HAND FUNCTION: Left Unable  COORDINATION:  Impaired  SENSATION: Light touch: WFL, proprioceptive awareness: Intact  EDEMA: N/A  MUSCLE TONE: LUE: Hypotonic  COGNITION: Overall cognitive status: Within functional limits for tasks assessed Pt. Reports memory limitations.  Patient has bilateral hearing aids however was not wearing them during the initial evaluation.  VISION: Subjective report: Pt. report having shingles affecting left eye  s/p 1 year. Has nerve pain Baseline vision: Wears glasses for reading only Visual history: Report no changes  VISION ASSESSMENT:   WFL for tasks performed  PERCEPTION: Intact  PRAXIS: Impaired: Motor planning  OBSERVATIONS: Patient reports being sore following a recent fall after discharging from inpatient rehab.   TODAY'S TREATMENT:    Therapeutic exercise:  Pt. worked on [employer], and reciprocal motion using the UBE while seated for 8 min. with constant monitoring of her left hand on the handle as well as assist to reposition her hand. Constant monitoring was provided. Patient performed left upper extremity strengthening with a 1# dowel in setting to 90 degree. Patient then performed shoulder flexion with 2.5# dowel for shoulder flexion and chest presses. Patient worked on shoulder left shoulder stabilization exercises fine with her shoulder flexed at 90 degrees with elbow in full extension with a 1# wrist cuff weight in place. Patient worked on stabilizing her shoulder with perturbations, as well as protraction. Pt. was able to keep  the dowel symmetrical between the right, and left UEs with less cuing today. Patient worked on movement patterns for combing her hair with her left arm. Patient required support at her left elbow and support of the comb in her left hand as she was combing her hair.  Patient worked with gross gripping using the light blue resistive Theraputty.  Worked on digit extension and  thumb abduction exercises at the tabletop.  PATIENT EDUCATION: Education details: OT services, POC, and goals. Person educated: Patient and Spouse Education method: Customer service manager Education comprehension: verbalized understanding, returned demonstration, and needs further education   HOME EXERCISE PROGRAM:  Assess ongoing need for HEP    GOALS: Goals reviewed with patient? Yes  SHORT TERM GOALS: Target date: 10/21/2022      Patient will be independent with home exercise program for the left upper extremity Baseline: No current home exercise  program Goal status: INITIAL  2.  Patient will independently formulate a full composite fist with the left hand.  Baseline: Second digit to the [facility]: 2cm.  Goal status: INITIAL              3: Pt. will improve active digit extension to be able to place her hand flat tabletop surface in preparation for weightbearing proprioceptive input. Baseline: Is unable to actively full digit extension Goal status: INITIAL   LONG TERM GOALS: Target date: 12/02/2022  Patient will improve active left shoulder flexion by 20 degrees preparation for reaching for the refrigerator door. Baseline: Eval: Left shoulder flexion 55(117) Goal status: INITIAL  2.  Patient will improve left shoulder active abduction to be able to comb her hair Baseline: Eval: Left shoulder abduction is 67(108) Goal status: INITIAL  3.  Patient will patiently button her shirt with modified independence. Baseline: Eval: Patient requires increased time to complete Goal status: INITIAL  4.  Patient well improve left grip strength preparation for cutting food Baseline: Eval:  Goal status: INITIAL  5.  Pt. will independently recall adaptive  strategies for performing ADL tasks including: flossing teeth, donning bra, applying makeup. Baseline: Eval: Pt. unable to perform Goal status: INITIAL  6.  Pt. will improve Foto score by 5 points to reflect patient  perceived performance improvement assessment specific ADLs  and IADLs Baseline: Eval: 45 Goal status: INITIAL    ASSESSMENT:  CLINICAL IMPRESSION:   Patient is making excellent progress with her left upper extremity range of motion and strength.  Patient was able to tolerate increased     dowel weight for shoulder flexion and 1 pound cuff weights for shoulder stabilization exercises. Patient is able to engage her left arm to assist with combing her hair with support.  Patient requires cues and encouragement to initiate the movement and to increase left shoulder range of motion to reach further during the task.  Patient continues to present with limited second digit extension, and thumb abduction.  Patient requires cues for maintaining left grip on the UBE when distracted. Patient continues to work on proving left upper extremity strength, wrist and digit extension, and overall  LUE functioning in order to improve engagement in, and maximize independence in ADLs and IADL tasks.  PERFORMANCE DEFICITS in functional skills including ADLs, IADLs, coordination, proprioception, ROM, strength, [place], and [facility], cognitive skills including memory, and psychosocial skills including coping strategies, environmental adaptation, interpersonal interactions, and routines and behaviors.   IMPAIRMENTS are limiting patient from ADLs, IADLs, education, leisure, and social participation.   COMORBIDITIES may have co-morbidities  that affects occupational performance. Patient will benefit from skilled OT to address above impairments and improve overall function.  MODIFICATION OR ASSISTANCE TO COMPLETE EVALUATION: Min-Moderate modification of tasks or assist with assess necessary to complete an evaluation.  OT OCCUPATIONAL PROFILE AND HISTORY: Detailed assessment: Review of records and additional review of physical, cognitive, psychosocial history related to current functional performance.  CLINICAL DECISION MAKING:  Moderate - several treatment options, min-mod task modification necessary  REHAB POTENTIAL: Good  EVALUATION COMPLEXITY: Moderate    PLAN: OT FREQUENCY: 3x/week  OT DURATION: 12 weeks  PLANNED INTERVENTIONS: self care/ADL training, therapeutic exercise, therapeutic activity, neuromuscular re-education, manual therapy, passive range of motion, functional mobility training, electrical stimulation, and paraffin  RECOMMENDED OTHER SERVICES: PT  CONSULTED AND AGREED WITH PLAN OF CARE: Patient and family Midwife  PLAN FOR NEXT SESSION: Initiate OT treatment  Harrel Carina, MS, OTR/L   Harrel Carina,  OT 09/18/2022, 5:19 PM

## 2022-09-22 DIAGNOSIS — R292 Abnormal reflex: Secondary | ICD-10-CM | POA: Diagnosis not present

## 2022-09-22 DIAGNOSIS — R0683 Snoring: Secondary | ICD-10-CM | POA: Diagnosis not present

## 2022-09-22 DIAGNOSIS — G8194 Hemiplegia, unspecified affecting left nondominant side: Secondary | ICD-10-CM | POA: Diagnosis not present

## 2022-09-22 DIAGNOSIS — Z8673 Personal history of transient ischemic attack (TIA), and cerebral infarction without residual deficits: Secondary | ICD-10-CM | POA: Diagnosis not present

## 2022-09-22 DIAGNOSIS — I639 Cerebral infarction, unspecified: Secondary | ICD-10-CM | POA: Diagnosis not present

## 2022-09-23 ENCOUNTER — Ambulatory Visit: Payer: Medicare HMO

## 2022-09-23 ENCOUNTER — Ambulatory Visit: Payer: Medicare HMO | Admitting: Speech Pathology

## 2022-09-23 DIAGNOSIS — R41841 Cognitive communication deficit: Secondary | ICD-10-CM

## 2022-09-23 DIAGNOSIS — M6281 Muscle weakness (generalized): Secondary | ICD-10-CM

## 2022-09-23 DIAGNOSIS — I6381 Other cerebral infarction due to occlusion or stenosis of small artery: Secondary | ICD-10-CM

## 2022-09-23 DIAGNOSIS — R262 Difficulty in walking, not elsewhere classified: Secondary | ICD-10-CM | POA: Diagnosis not present

## 2022-09-23 DIAGNOSIS — R278 Other lack of coordination: Secondary | ICD-10-CM | POA: Diagnosis not present

## 2022-09-23 NOTE — Therapy (Addendum)
OUTPATIENT PHYSICAL THERAPY NEURO EVALUATION   Patient Name: [name] MRN: [id number] DOB:Mar 23, 1941, 81 y.o., female Today's Date: 09/23/2022   PCP: Idelle Crouch, MD REFERRING PROVIDER: Bary Leriche, PA-C   PT End of Session - 09/23/22 1617     Visit Number 1    Number of Visits 24    Date for PT Re-Evaluation 12/16/22    Authorization Type [employer] - Needs Authorization    PT Start Time 1615    PT Stop Time 1700    PT Time Calculation (min) 45 min    Activity Tolerance Patient tolerated treatment well    Behavior During Therapy WFL for tasks assessed/performed             Past Medical History:  Diagnosis Date   Anemia    Arthritis    Asthma    uses inhaler just prior to surgery to avoid attack   Back pain    from previous injury   Complication of anesthesia    has woken  up during 2 different surgery   Depression    no current issue/treatment; situation   Gallstones    GERD (gastroesophageal reflux disease)    Hiatal hernia    patient does NOT have nerve/muscle disease   History of kidney stones    HLD (hyperlipidemia)    HTN (hypertension)    Hypothyroidism    Kidney stones    Knee pain    Non-diabetic pancreatic hormone dysfunction years   pt. 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   Pancreatitis    Pneumonia    Seizures (HCC)    caused by dye injected during a procedure   Shortness of breath    with exertion   Sinus problem    frequent infections/congestion   Stroke ([place]) 2021   reports having CVA in 2021 and having mini strokes before that   Thyroid disease    Past Surgical History:  Procedure Laterality Date   ABDOMINAL HYSTERECTOMY     APPENDECTOMY     CARPAL TUNNEL RELEASE  10+ years ago   bilateral   EYE SURGERY  3 yrs ago   bilateral cataracts   FOOT OSTEOTOMY  6 weeks ago   Left foot: great, 2nd & 3rd   FOOT OSTEOTOMY  5 years ago   Right great toe   HAND SURGERY Bilateral 2011-most recent   multiple hand  surgeries, 2 on left, 3 on right   KNEE ARTHROPLASTY Right 04/28/2022   Procedure: COMPUTER ASSISTED TOTAL KNEE ARTHROPLASTY;  Surgeon: Dereck Leep, MD;  Location: [facility];  Service: Orthopedics;  Laterality: Right;   LOOP RECORDER INSERTION N/A 05/16/2020   Procedure: LOOP RECORDER INSERTION;  Surgeon: Isaias Cowman, MD;  Location: [facility];  Service: Cardiovascular;  Laterality: N/A;   NASAL SINUS SURGERY  most recent 7-8 yrs ago   7 sinus surgeries    TRIGGER FINGER RELEASE  11/19/2011   Procedure: RELEASE TRIGGER FINGER/A-1 PULLEY;  Surgeon: Wynonia Sours, MD;  Location: [place];  Service: Orthopedics;  Laterality: Right;  release a-1 pulley right index finger and cyst removal   WRIST GANGLION EXCISION  1980's   right   Patient Active Problem List   Diagnosis Date Noted   Fatigue 09/17/2022   Left hemiparesis ([place]) 09/17/2022   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   GERD (gastroesophageal reflux disease) 08/21/2022   Agitation 08/20/2022   Acute left-sided weakness 08/20/2022   Expressive aphasia  Stroke ([place]) 08/19/2022   Leukocytosis 08/19/2022   History of urticaria 04/28/2022   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   Primary osteoarthritis of left knee 02/24/2022   Primary osteoarthritis of right knee 02/24/2022   Lumbar spondylolysis 04/12/2020   History of CVA (cerebrovascular accident) 03/26/2020   Low back pain radiating to right lower extremity 03/21/2020   B12 deficiency 03/06/2020   Positive anti-CCP test 12/21/2019   Arthralgia 12/13/2019   Dermatitis 12/13/2019   Rheumatoid factor positive 12/13/2019   Essential hypertension 12/11/2018   Palpitations 12/11/2018   Acquired hypothyroidism 11/10/2018   Arthritis of knee 09/17/2016   Anxiety 11/22/2014   Asthma without status asthmaticus 11/22/2014   Benign neoplasm of colon, unspecified 11/22/2014   Environmental allergies 11/22/2014   Hypertriglyceridemia  11/22/2014   Hypokalemia 11/22/2014   Personal history of disease of skin and subcutaneous tissue 11/22/2014    ONSET DATE: 08/18/22  REFERRING DIAG: I63.81 (ICD-10-CM) - 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  Pt notes she has had a fall since the most recent stroke.  Pt notes she experienced L sided weakness and that her LE is doing much better.  Pt utilizes a rolling walker at home when she is walking longer distances.  Pt notes she was anticipating having a L TKA the week of the stroke, but had to cancel.  Pt notes that having the pain in the L knee has caused her to be slower and progression to be limited.  Pt accompanied by: self and significant other; [name].  PERTINENT HISTORY:  Patient is a 81 year old female who was diagnosed with a right thalamic CVA on 08/18/2022 with left-sided weakness.  Patient underwent inpatient rehabilitation for 2 weeks.  Patient was assessed and was scheduled for a knee replacement on 08/29/2022 however had to cancel it due to having had a CVA.  Patient had a recent fall 2 days after discharging from inpatient rehab. past medical history includes: Knee replacement, essential HTN, hypokalemia, leukocytosis, seizures, positive anti-- CCP test, anxiety disorder, mini strokes.  Patient had shingles with left eye nerve pain s/p 1 year ago.   PAIN:  Are you having pain? Yes, pt is having 7/10 pain in the L shoulder and also in the L knee.    PRECAUTIONS: None  WEIGHT BEARING  RESTRICTIONS No  FALLS: Has patient fallen in last 6 months? Yes. Number of falls 3  LIVING ENVIRONMENT: Lives with: lives with their spouse Lives in: House/apartment Stairs: Yes: Internal: 12 steps; on left going up and External: 6 in the back steps; can reach both Has following equipment at home: Walker - 2 wheeled  PLOF: [place] Pt wants to get use of her shoulder and her legs back and improve balance.  OBJECTIVE:   DIAGNOSTIC FINDINGS:   FINDINGS: Brain: Acute infarct in the right corona radiata. Small acute infarct in the right anterior thalamus.   Ventricle size normal. Patchy white matter hyperintensity bilaterally compatible with chronic microvascular ischemia. Chronic infarcts in the thalamus bilaterally. Chronic infarcts in the lateral basal ganglia bilaterally. Negative for hemorrhage or mass.   Image quality degraded by motion   Vascular: Normal arterial flow voids.   Skull and upper cervical spine: No focal skeletal lesion. C1-2 arthropathy with pannus.   Sinuses/Orbits: Paranasal sinuses clear. Bilateral cataract extraction   Other: None   IMPRESSION: Acute infarct in the right corona radiata. Small acute infarct in the right anterior thalamus  COGNITION: Overall cognitive status: Within functional limits for tasks assessed   SENSATION: WFL; pt notes having a history of the hands having some numbness/tingling  COORDINATION: Pt unable to do quick coordination tests in the L UE.  Pt able to tolerate movement, but unable to do the movements quickly.  POSTURE: No Significant postural limitations   LOWER EXTREMITY MMT:    MMT Right Eval Left Eval  Hip flexion 4 4-  Hip extension 4 4-  Hip abduction 4 4-  Hip adduction 4 4-  Knee flexion 4 4-  Knee extension 4 4-  Ankle dorsiflexion 4 4-  (Blank rows = not tested)  FUNCTIONAL TESTs:  5 times sit to stand: 11.13 sec Timed up and go (TUG): 11.33 sec 6 minute walk test:  Perform at evaluation 10 meter walk test: 12.18 sec  PATIENT SURVEYS:  ABC scale 53.75% FOTO 58; Predicted 68  TODAY'S TREATMENT:   PT Evaluation only   PATIENT EDUCATION: Education details: Pt educated on role of PT and services provided during current POC, along with prognosis and information about the clinic. Person educated: Patient and Spouse Education method: Explanation Education comprehension: verbalized understanding   HOME EXERCISE PROGRAM:  Will give to pt at next visit.    GOALS: Goals reviewed with patient? Yes  SHORT TERM GOALS: Target date: 10/21/2022  Pt will be independent with HEP in order to demonstrate increased ability to perform tasks related to occupation/hobbies. Baseline: No HEP given at eval Goal status: INITIAL  LONG TERM GOALS: Target date: 12/16/2022  1.  Patient (> 60 years old) will complete five times sit to stand test in < 10 seconds indicating an increased LE strength and improved balance. Baseline: 11.13 Goal status: INITIAL  2.  Patient will increase FOTO score to equal to or greater than  68   to demonstrate statistically significant improvement in mobility and quality of life.  Baseline: 58 Goal status: INITIAL   3.  Patient will increase ABC Balance scale by > 11% points to demonstrate decreased fall risk during functional activities. Baseline: 53.75% Goal status: INITIAL   4.  Patient will reduce timed up and go to <11 seconds to  reduce fall risk and demonstrate improved transfer/gait ability. Baseline: 11.33 sec Goal status: INITIAL  5. Patient will increase 10 meter walk test to >1.63ms as to improve gait speed for better community ambulation and to reduce fall risk. Baseline: 0.82 m/se Goal status: INITIAL  6.  Patient will increase six minute walk test distance to >1000 for progression to community ambulator and improve gait ability Baseline: Not assessed at initial evaluation Goal status:  INITIAL    ASSESSMENT:  CLINICAL IMPRESSION: Patient is a 81y.o. female who was seen today for physical therapy evaluation and treatment for R thalamic stroke affecting the L side.  Pt presents with physical impairments of decreased activity tolerance, decreased balance,  increased pain in L shoulder and L knee, and decreased strength in B LE as noted.  Pt will benefit from skilled therapy to address tolerance, balance, pain, and strength impairments necessary for improvement in quality of life.  Pt. demonstrates understanding of this plan of care and agrees with this plan.     OBJECTIVE IMPAIRMENTS Abnormal gait, decreased activity tolerance, decreased balance, decreased endurance, decreased mobility, difficulty walking, decreased strength, and pain.   ACTIVITY LIMITATIONS carrying, lifting, bending, squatting, stairs, transfers, dressing, reach over head, and hygiene/grooming  PARTICIPATION LIMITATIONS: cleaning, laundry, shopping, community activity, and yard work  PERSONAL FACTORS Age, Fitness, Past/current experiences, and needing L TKA  are also affecting patient's functional outcome.   REHAB POTENTIAL: Good  CLINICAL DECISION MAKING: Stable/uncomplicated  EVALUATION COMPLEXITY: Low  PLAN: PT FREQUENCY: 2x/week  PT DURATION: 12 weeks  PLANNED INTERVENTIONS: Therapeutic exercises, Therapeutic activity, Neuromuscular re-education, Balance training, Gait training, Patient/Family education, Self Care, Joint mobilization, DME instructions, Dry Needling, Moist heat, and Manual therapy  PLAN FOR NEXT SESSION: Give HEP, assess 6MWT, perform strengthening exercises necessary for improved balance.   JGwenlyn Saran PT, DPT 09/23/22, 5:45 PM

## 2022-09-25 ENCOUNTER — Encounter: Payer: Self-pay | Admitting: Occupational Therapy

## 2022-09-25 ENCOUNTER — Ambulatory Visit: Payer: Medicare HMO | Admitting: Occupational Therapy

## 2022-09-25 ENCOUNTER — Ambulatory Visit: Payer: Medicare HMO | Admitting: Speech Pathology

## 2022-09-25 ENCOUNTER — Ambulatory Visit: Payer: Medicare HMO

## 2022-09-25 DIAGNOSIS — I6381 Other cerebral infarction due to occlusion or stenosis of small artery: Secondary | ICD-10-CM

## 2022-09-25 DIAGNOSIS — M6281 Muscle weakness (generalized): Secondary | ICD-10-CM

## 2022-09-25 DIAGNOSIS — R262 Difficulty in walking, not elsewhere classified: Secondary | ICD-10-CM | POA: Diagnosis not present

## 2022-09-25 DIAGNOSIS — R278 Other lack of coordination: Secondary | ICD-10-CM | POA: Diagnosis not present

## 2022-09-25 DIAGNOSIS — R41841 Cognitive communication deficit: Secondary | ICD-10-CM

## 2022-09-25 NOTE — Therapy (Signed)
OUTPATIENT OCCUPATIONAL THERAPY NEURO TREATMENT NOTE  Patient Name: [name] MRN: [id number] DOB:12-Apr-1941, 81 y.o., female Today's Date: 09/25/2022  PCP: Dr. Sylvester Harder PROVIDER: Reesa Chew   OT End of Session - 09/25/22 1701     Visit Number 5    Number of Visits 36    Date for OT Re-Evaluation 12/02/22    Authorization Time Period Progress report period starting 09/09/2022    OT Start Time 1605    OT Stop Time 1645    OT Time Calculation (min) 40 min    Activity Tolerance Patient limited by pain;Patient tolerated treatment well    Behavior During Therapy [facility] for tasks assessed/performed             Past Medical History:  Diagnosis Date   Anemia    Arthritis    Asthma    uses inhaler just prior to surgery to avoid attack   Back pain    from previous injury   Complication of anesthesia    has woken  up during 2 different surgery   Depression    no current issue/treatment; situation   Gallstones    GERD (gastroesophageal reflux disease)    Hiatal hernia    patient does NOT have nerve/muscle disease   History of kidney stones    HLD (hyperlipidemia)    HTN (hypertension)    Hypothyroidism    Kidney stones    Knee pain    Non-diabetic pancreatic hormone dysfunction years   pt. 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   Pancreatitis    Pneumonia    Seizures (HCC)    caused by dye injected during a procedure   Shortness of breath    with exertion   Sinus problem    frequent infections/congestion   Stroke ([place]) 2021   reports having CVA in 2021 and having mini strokes before that   Thyroid disease    Past Surgical History:  Procedure Laterality Date   ABDOMINAL HYSTERECTOMY     APPENDECTOMY     CARPAL TUNNEL RELEASE  10+ years ago   bilateral   EYE SURGERY  3 yrs ago   bilateral cataracts   FOOT OSTEOTOMY  6 weeks ago   Left foot: great, 2nd & 3rd   FOOT OSTEOTOMY  5 years ago   Right great toe   HAND SURGERY Bilateral 2011-most  recent   multiple hand surgeries, 2 on left, 3 on right   KNEE ARTHROPLASTY Right 04/28/2022   Procedure: COMPUTER ASSISTED TOTAL KNEE ARTHROPLASTY;  Surgeon: Dereck Leep, MD;  Location: [facility];  Service: Orthopedics;  Laterality: Right;   LOOP RECORDER INSERTION N/A 05/16/2020   Procedure: LOOP RECORDER INSERTION;  Surgeon: Isaias Cowman, MD;  Location: [place] [facility];  Service: Cardiovascular;  Laterality: N/A;   NASAL SINUS SURGERY  most recent 7-8 yrs ago   7 sinus surgeries    TRIGGER FINGER RELEASE  11/19/2011   Procedure: RELEASE TRIGGER FINGER/A-1 PULLEY;  Surgeon: Wynonia Sours, MD;  Location: [place];  Service: Orthopedics;  Laterality: Right;  release a-1 pulley right index finger and cyst removal   WRIST GANGLION EXCISION  1980's   right   Patient Active Problem List   Diagnosis Date Noted   Fatigue 09/17/2022   Left hemiparesis ([place]) 09/17/2022   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   GERD (gastroesophageal reflux disease) 08/21/2022   Agitation 08/20/2022   Acute left-sided weakness 08/20/2022   Expressive aphasia  Stroke ([place]) 08/19/2022   Leukocytosis 08/19/2022   History of urticaria 04/28/2022   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   Primary osteoarthritis of left knee 02/24/2022   Primary osteoarthritis of right knee 02/24/2022   Lumbar spondylolysis 04/12/2020   History of CVA (cerebrovascular accident) 03/26/2020   Low back pain radiating to right lower extremity 03/21/2020   B12 deficiency 03/06/2020   Positive anti-CCP test 12/21/2019   Arthralgia 12/13/2019   Dermatitis 12/13/2019   Rheumatoid factor positive 12/13/2019   Essential hypertension 12/11/2018   Palpitations 12/11/2018   Acquired hypothyroidism 11/10/2018   Arthritis of knee 09/17/2016   Anxiety 11/22/2014   Asthma without status asthmaticus 11/22/2014   Benign neoplasm of colon, unspecified 11/22/2014   Environmental allergies 11/22/2014    Hypertriglyceridemia 11/22/2014   Hypokalemia 11/22/2014   Personal history of disease of skin and subcutaneous tissue 11/22/2014    ONSET DATE: 08/18/2022  REFERRING DIAG: CVA  THERAPY DIAG:  Muscle weakness (generalized)  Other lack of coordination  Rationale for Evaluation and Treatment Rehabilitation  SUBJECTIVE:   SUBJECTIVE STATEMENT:   Pt. Reports trying to use her left hand to engage in ADLs, and IADL tasks.   Pt accompanied by: significant other  PERTINENT HISTORY: Patient is a 81 year old female who was diagnosed with a right thalamic CVA on 08/18/2022 with left-sided weakness.  Patient underwent inpatient rehabilitation for 2 weeks.  Patient was assessed and was scheduled for a knee replacement on 08/29/2022 however had to cancel it due to having had a CVA.  Patient had a recent fall 2 days after discharging from inpatient rehab. past medical history includes: Knee replacement, essential HTN, hypokalemia, leukocytosis, seizures, positive anti-- CCP test, anxiety disorder, mini strokes.  Patient had shingles with left eye nerve pain s/p 1 year ago.   PRECAUTIONS: Fall  WEIGHT BEARING RESTRICTIONS No  PAIN:  Are you having pain? No  FALLS: Has patient fallen in last 6 months? Yes. Number of falls 1  LIVING ENVIRONMENT: Lives with: Lives with Spouse Lives in: House/apartment Stairs: 2 storey home, resides on the first floor.  External: 2 stairs front no rails, and 6 in back with rails Has following equipment at home: Single point cane, Walker - 2 wheeled, Environmental consultant - 4 wheeled, Shower bench, and bed side commode  PLOF: Independent  PATIENT GOALS  Pt. Regain the use of her left arm  OBJECTIVE:   HAND DOMINANCE: Right  ADLs: Overall ADLs: Husband assists pt. as needed Transfers/ambulation related to ADLs:Pt. Has assistive devices. Eating: Pt. Is independent with the right hand. Pt. Is unable to cut food, and open packets Grooming: Pt. Is using her right hand,  however has difficulty using her left to assist with haircare UB Dressing: Pt. Is independent donning a pullover shirt, and button down shirt. Has difficulty with buttoning, LB Dressing:  Independent donning pants, and socks. Difficulty tying shoes. Toileting: Independent Bathing: independent using the right hand, difficulty using the LUE for the right side. Tub Shower transfers: Supervision Equipment: See above for equipment   IADLs: Shopping:  Has not had the opportunity for grocery shopping yet Light housekeeping: Husband assisting with light house keeping Meal Prep:  Dependent Community mobility: Relies of family/friends Medication management: Husband Oncologist: TBD Handwriting: 75% legible  MOBILITY STATUS: Hx of falls  POSTURE COMMENTS:  No Significant postural limitations Sitting balance: supported sitting balance WFL  ACTIVITY TOLERANCE: Activity tolerance:  Fatigues with 10-15 min.  FUNCTIONAL OUTCOME MEASURES: FOTO: 45  UPPER EXTREMITY ROM  Active ROM Right Eval: WFL Left eval  Shoulder flexion  55(117)  Shoulder abduction  67(108  Shoulder adduction    Shoulder extension    Shoulder internal rotation    Shoulder external rotation    Elbow flexion  122(144)  Elbow extension  -16(0)  Wrist flexion  62  Wrist extension  -22(50)  Wrist ulnar deviation    Wrist radial deviation    Wrist pronation    Wrist supination    (Blank rows = not tested)  Left digit flexion to [facility]: 2nd: 2cm, 3rd: 0cm, 4th: 0cm, 5th: 0cm   Limited Left full digit extension   UPPER EXTREMITY MMT:     MMT Right Eval: 4+/5 overall Left eval  Shoulder flexion  3-/5  Shoulder abduction  3-/5  Shoulder adduction    Shoulder extension    Shoulder internal rotation    Shoulder external rotation    Middle trapezius    Lower trapezius    Elbow flexion  3+/5  Elbow extension  3+/5  Wrist flexion    Wrist extension  2-/5  Wrist ulnar deviation    Wrist  radial deviation    Wrist pronation    Wrist supination    (Blank rows = not tested)  HAND FUNCTION: Left Unable  COORDINATION:  Impaired  SENSATION: Light touch: WFL, proprioceptive awareness: Intact  EDEMA: N/A  MUSCLE TONE: LUE: Hypotonic  COGNITION: Overall cognitive status: Within functional limits for tasks assessed Pt. Reports memory limitations.  Patient has bilateral hearing aids however was not wearing them during the initial evaluation.  VISION: Subjective report: Pt. report having shingles affecting left eye  s/p 1 year. Has nerve pain Baseline vision: Wears glasses for reading only Visual history: Report no changes  VISION ASSESSMENT:   WFL for tasks performed  PERCEPTION: Intact  PRAXIS: Impaired: Motor planning  OBSERVATIONS: Patient reports being sore following a recent fall after discharging from inpatient rehab.   TODAY'S TREATMENT:    Therapeutic exercise:  Pt. worked on [employer], and reciprocal motion using the UBE while seated for 5 min. with constant monitoring of her left hand on the handle as well as assist to reposition her hand. Constant monitoring was provided. Patient performed left upper extremity strengthening with a 1# dowel in sitting above 90 degrees, chest presses, followed by using the dowel to reach a target at various planes/angles with the 1# dowel weight. Pt worked on reaching across midline with the LUE with a 1# cuff weight in place, and placing the cones on a tabletop surface to the left. Pt. was able to keep  the dowel symmetrical between the right, and left UE's consistently today. Pt. worked on left digit extension exercises with resistance, as well as thumb abduction. Pt. worked on digit extension and thumb abduction exercises at the tabletop. Pt. Alternated with weightbearing, and proprioception through the LUE, and hand.   Pt. continues to make excellent progress with LUE functioning. Pt. was able to maintain her left  grip on the handles for longer intervals on the UBE today. Pt. required frequent cuing to avoid compensation proximally with trunk retropulsion, and hiking of the left shoulder during UE exercises. Pt required cues to focus on breath during the exercises, as Pt. tends to hold her breath throughout the exercises. Pt. Is improving with gross gripping, and digit MP, PIP, and DIP extension. Pt. Continues to present with thumb IP tightness, and 2nd digit tightness which affects the accuracy of grasping, and releasing of objects.  Patient continues to work on proving left upper extremity strength, wrist and digit extension, and overall  LUE functioning in order to improve engagement in, and maximize independence in ADLs and IADL tasks.    PATIENT EDUCATION: Education details: OT services, POC, and goals. Person educated: Patient and Spouse Education method: Customer service manager Education comprehension: verbalized understanding, returned demonstration, and needs further education   HOME EXERCISE PROGRAM:  Assess ongoing need for HEP    GOALS: Goals reviewed with patient? Yes  SHORT TERM GOALS: Target date: 10/21/2022      Patient will be independent with home exercise program for the left upper extremity Baseline: No current home exercise program Goal status: INITIAL  2.  Patient will independently formulate a full composite fist with the left hand.  Baseline: Second digit to the [facility]: 2cm.  Goal status: INITIAL              3: Pt. will improve active digit extension to be able to place her hand flat tabletop surface in preparation for weightbearing proprioceptive input. Baseline: Is unable to actively full digit extension Goal status: INITIAL   LONG TERM GOALS: Target date: 12/02/2022  Patient will improve active left shoulder flexion by 20 degrees preparation for reaching for the refrigerator door. Baseline: Eval: Left shoulder flexion 55(117) Goal status: INITIAL  2.   Patient will improve left shoulder active abduction to be able to comb her hair Baseline: Eval: Left shoulder abduction is 67(108) Goal status: INITIAL  3.  Patient will patiently button her shirt with modified independence. Baseline: Eval: Patient requires increased time to complete Goal status: INITIAL  4.  Patient well improve left grip strength preparation for cutting food Baseline: Eval:  Goal status: INITIAL  5.  Pt. will independently recall adaptive  strategies for performing ADL tasks including: flossing teeth, donning bra, applying makeup. Baseline: Eval: Pt. unable to perform Goal status: INITIAL  6.  Pt. will improve Foto score by 5 points to reflect patient perceived performance improvement assessment specific ADLs  and IADLs Baseline: Eval: 45 Goal status: INITIAL    ASSESSMENT:  CLINICAL IMPRESSION:   Pt. continues to make excellent progress with LUE functioning. Pt. was able to maintain her left grip on the handles for longer intervals on the UBE today. Pt. required frequent cuing to avoid compensation proximally with trunk retropulsion, and hiking of the left shoulder during UE exercises. Pt required cues to focus on breath during the exercises, as Pt. tends to hold her breath throughout the exercises. Pt. Is improving with gross gripping, and digit MP, PIP, and DIP extension. Pt. Continues to present with thumb IP tightness, and 2nd digit tightness which affects the accuracy of grasping, and releasing of objects. Patient continues to work on proving left upper extremity strength, wrist and digit extension, and overall  LUE functioning in order to improve engagement in, and maximize independence in ADLs and IADL tasks.    PERFORMANCE DEFICITS in functional skills including ADLs, IADLs, coordination, proprioception, ROM, strength, Soldier, and [facility], cognitive skills including memory, and psychosocial skills including coping strategies, environmental adaptation, interpersonal  interactions, and routines and behaviors.   IMPAIRMENTS are limiting patient from ADLs, IADLs, education, leisure, and social participation.   COMORBIDITIES may have co-morbidities  that affects occupational performance. Patient will benefit from skilled OT to address above impairments and improve overall function.  MODIFICATION OR ASSISTANCE TO COMPLETE EVALUATION: Min-Moderate modification of tasks or assist with assess necessary to complete an evaluation.  OT OCCUPATIONAL  PROFILE AND HISTORY: Detailed assessment: Review of records and additional review of physical, cognitive, psychosocial history related to current functional performance.  CLINICAL DECISION MAKING: Moderate - several treatment options, min-mod task modification necessary  REHAB POTENTIAL: Good  EVALUATION COMPLEXITY: Moderate    PLAN: OT FREQUENCY: 3x/week  OT DURATION: 12 weeks  PLANNED INTERVENTIONS: self care/ADL training, therapeutic exercise, therapeutic activity, neuromuscular re-education, manual therapy, passive range of motion, functional mobility training, electrical stimulation, and paraffin  RECOMMENDED OTHER SERVICES: PT  CONSULTED AND AGREED WITH PLAN OF CARE: Patient and family member/caregiver  PLAN FOR NEXT SESSION: Initiate OT treatment  Harrel Carina, MS, OTR/L   Harrel Carina, OT 09/25/2022, 5:08 PM

## 2022-09-25 NOTE — Therapy (Signed)
OUTPATIENT PHYSICAL THERAPY NEURO TREATMENT   Patient Name: [name] MRN: [id number] DOB:10/15/1941, 81 y.o., female Today's Date: 09/25/2022   PCP: Idelle Crouch, MD REFERRING PROVIDER: Bary Leriche, PA-C   PT End of Session - 09/25/22 1519     Visit Number 2    Number of Visits 24    Date for PT Re-Evaluation 12/16/22    Authorization Type [employer] - Needs Authorization    PT Start Time 0630    PT Stop Time 1600    PT Time Calculation (min) 43 min    Equipment Utilized During Treatment Gait belt    Activity Tolerance Patient tolerated treatment well    Behavior During Therapy WFL for tasks assessed/performed              Past Medical History:  Diagnosis Date   Anemia    Arthritis    Asthma    uses inhaler just prior to surgery to avoid attack   Back pain    from previous injury   Complication of anesthesia    has woken  up during 2 different surgery   Depression    no current issue/treatment; situation   Gallstones    GERD (gastroesophageal reflux disease)    Hiatal hernia    patient does NOT have nerve/muscle disease   History of kidney stones    HLD (hyperlipidemia)    HTN (hypertension)    Hypothyroidism    Kidney stones    Knee pain    Non-diabetic pancreatic hormone dysfunction years   pt. 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   Pancreatitis    Pneumonia    Seizures (HCC)    caused by dye injected during a procedure   Shortness of breath    with exertion   Sinus problem    frequent infections/congestion   Stroke ([place]) 2021   reports having CVA in 2021 and having mini strokes before that   Thyroid disease    Past Surgical History:  Procedure Laterality Date   ABDOMINAL HYSTERECTOMY     APPENDECTOMY     CARPAL TUNNEL RELEASE  10+ years ago   bilateral   EYE SURGERY  3 yrs ago   bilateral cataracts   FOOT OSTEOTOMY  6 weeks ago   Left foot: great, 2nd & 3rd   FOOT OSTEOTOMY  5 years ago   Right great toe   HAND  SURGERY Bilateral 2011-most recent   multiple hand surgeries, 2 on left, 3 on right   KNEE ARTHROPLASTY Right 04/28/2022   Procedure: COMPUTER ASSISTED TOTAL KNEE ARTHROPLASTY;  Surgeon: Dereck Leep, MD;  Location: [facility];  Service: Orthopedics;  Laterality: Right;   LOOP RECORDER INSERTION N/A 05/16/2020   Procedure: LOOP RECORDER INSERTION;  Surgeon: Isaias Cowman, MD;  Location: [place] [facility];  Service: Cardiovascular;  Laterality: N/A;   NASAL SINUS SURGERY  most recent 7-8 yrs ago   7 sinus surgeries    TRIGGER FINGER RELEASE  11/19/2011   Procedure: RELEASE TRIGGER FINGER/A-1 PULLEY;  Surgeon: Wynonia Sours, MD;  Location: [place];  Service: Orthopedics;  Laterality: Right;  release a-1 pulley right index finger and cyst removal   WRIST GANGLION EXCISION  1980's   right   Patient Active Problem List   Diagnosis Date Noted   Fatigue 09/17/2022   Left hemiparesis ([place]) 09/17/2022   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   GERD (gastroesophageal reflux disease) 08/21/2022   Agitation 08/20/2022  Acute left-sided weakness 08/20/2022   Expressive aphasia    Stroke ([place]) 08/19/2022   Leukocytosis 08/19/2022   History of urticaria 04/28/2022   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   Primary osteoarthritis of left knee 02/24/2022   Primary osteoarthritis of right knee 02/24/2022   Lumbar spondylolysis 04/12/2020   History of CVA (cerebrovascular accident) 03/26/2020   Low back pain radiating to right lower extremity 03/21/2020   B12 deficiency 03/06/2020   Positive anti-CCP test 12/21/2019   Arthralgia 12/13/2019   Dermatitis 12/13/2019   Rheumatoid factor positive 12/13/2019   Essential hypertension 12/11/2018   Palpitations 12/11/2018   Acquired hypothyroidism 11/10/2018   Arthritis of knee 09/17/2016   Anxiety 11/22/2014   Asthma without status asthmaticus 11/22/2014   Benign neoplasm of colon, unspecified 11/22/2014   Environmental  allergies 11/22/2014   Hypertriglyceridemia 11/22/2014   Hypokalemia 11/22/2014   Personal history of disease of skin and subcutaneous tissue 11/22/2014    ONSET DATE: 08/18/22  REFERRING DIAG: I63.81 (ICD-10-CM) - 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  Pt ready to continue with therapy upon arrival and get HEP.  Pt accompanied by: self and significant other; [name].  PERTINENT HISTORY:  Patient is a 81 year old female who was diagnosed with a right thalamic CVA on 08/18/2022 with left-sided weakness.  Patient underwent inpatient rehabilitation for 2 weeks.  Patient was assessed and was scheduled for a knee replacement on 08/29/2022 however had to cancel it due to having had a CVA.  Patient had a recent fall 2 days after discharging from inpatient rehab. past medical history includes: Knee replacement, essential HTN, hypokalemia, leukocytosis, seizures, positive anti-- CCP test, anxiety disorder, mini strokes.  Patient had shingles with left eye nerve pain s/p 1 year ago.   PAIN:  Are you having pain? Yes, pt is having 7/10 pain in the L shoulder and also in the L knee.    PRECAUTIONS: None  WEIGHT BEARING RESTRICTIONS No  FALLS: Has patient fallen in last 6 months? Yes. Number of falls 3  LIVING ENVIRONMENT: Lives with: lives with  their spouse Lives in: House/apartment Stairs: Yes: Internal: 12 steps; on left going up and External: 6 in the back  steps; can reach both Has following equipment at home: Walker - 2 wheeled  PLOF: Independent  PATIENT GOALS Pt wants to get use of her shoulder and her legs back and improve balance.  OBJECTIVE:   DIAGNOSTIC FINDINGS:   FINDINGS: Brain: Acute infarct in the right corona radiata. Small acute infarct in the right anterior thalamus.   Ventricle size normal. Patchy white matter hyperintensity bilaterally compatible with chronic microvascular ischemia. Chronic infarcts in the thalamus bilaterally. Chronic infarcts in the lateral basal ganglia bilaterally. Negative for hemorrhage or mass.   Image quality degraded by motion   Vascular: Normal arterial flow voids.   Skull and upper cervical spine: No focal skeletal lesion. C1-2 arthropathy with pannus.   Sinuses/Orbits: Paranasal sinuses clear. Bilateral cataract extraction   Other: None   IMPRESSION: Acute infarct in the right corona radiata. Small acute infarct in the right anterior thalamus  COGNITION: Overall cognitive status: Within functional limits for tasks assessed   SENSATION: WFL; pt notes having a history of the hands having some numbness/tingling  COORDINATION: Pt unable to do quick coordination tests in the L UE.  Pt able to tolerate movement, but unable to do the movements quickly.  POSTURE: No Significant postural limitations   LOWER EXTREMITY MMT:    MMT Right Eval Left Eval  Hip flexion 4 4-  Hip extension 4 4-  Hip abduction 4 4-  Hip adduction 4 4-  Knee flexion 4 4-  Knee extension 4 4-  Ankle dorsiflexion 4 4-  (Blank rows = not tested)  FUNCTIONAL TESTs:  5 times sit to stand: 11.13 sec Timed up and go (TUG): 11.33 sec 6 minute walk test: Perform at evaluation 10 meter walk test: 12.18 sec  PATIENT SURVEYS:  ABC scale 53.75% FOTO 58; Predicted 68  TODAY'S TREATMENT:   Therapeutic Activity:  Completed goal assessment including 6 minute walk test as noted below.  Pt stopped due  to knee pain and slight SOB.  Pt states she could have continued with ambulation had her knees not bothered her as much.  Education on goals and use of goals provided to pt and husband.  TherEx:  Standing Hip Abduction with B UE Support, 2x15 Standing Hip Extension with B UE Support, 2x15 Standing Knee Flexion with B Support, 2x15 Standing March with UE Support , 2x15 Heel Heel Raises with UE Support, 2x15 Crabwalks with BTB resistance around distal thighs, 2x10 ft each direction   PATIENT EDUCATION: Education details: Pt educated on role of PT and services provided during current POC, along with prognosis and information about the clinic. Person educated: Patient and Spouse Education method: Explanation Education comprehension: verbalized understanding   HOME EXERCISE PROGRAM:  Access Code: [email or web address] URL: [email or web address] Date: 09/25/2022 Prepared by: Proctor Nation  Exercises - Standing Hip Abduction with Counter Support  - 1 x daily - 7 x weekly - 3 sets - 10 reps - Standing Hip Extension with Counter Support  - 1 x daily - 7 x weekly - 3 sets - 10 reps - Standing Knee Flexion with Counter Support  - 1 x daily - 7 x weekly - 3 sets - 10 reps - Standing March with Unilateral Counter Support  - 1 x daily - 7 x weekly - 3 sets - 10 reps - Heel Toe Raises with Unilateral Counter Support  - 1 x daily - 7 x  weekly - 3 sets - 10 reps    GOALS: Goals reviewed with patient? Yes  SHORT TERM GOALS: Target date: 10/23/2022  Pt will be independent with HEP in order to demonstrate increased ability to perform tasks related to occupation/hobbies. Baseline: No HEP given at eval Goal status: INITIAL  LONG TERM GOALS: Target date: 12/18/2022  1.  Patient (> 34 years old) will complete five times sit to stand test in < 10 seconds indicating an increased LE strength and improved balance. Baseline: 11.13 Goal status: INITIAL  2.  Patient will increase FOTO score to  equal to or greater than  68   to demonstrate statistically significant improvement in mobility and quality of life.  Baseline: 58 Goal status: INITIAL   3.  Patient will increase ABC Balance scale by > 11% points to demonstrate decreased fall risk during functional activities. Baseline: 53.75% Goal status: INITIAL   4.  Patient will reduce timed up and go to <11 seconds to reduce fall risk and demonstrate improved transfer/gait ability. Baseline: 11.33 sec Goal status: INITIAL  5. Patient will increase 10 meter walk test to >1.42ms as to improve gait speed for better community ambulation and to reduce fall risk. Baseline: 0.82 m/se Goal status: INITIAL  6.  Patient will increase six minute walk test distance to >1000 for progression to community ambulator and improve gait ability Baseline: 600 ft but ended at 4:34 seconds in. Goal status: INITIAL    ASSESSMENT:  CLINICAL IMPRESSION:  Pt performed well with therapeutic activities and the education provided for assessment measures.  Pt and husband also educated on HEP and the app associated with the exercises.  Pt's husband able to download and start the exercises so that the pt would have them over the weekend.  Pt performed well with all the exercises and will continue to progress with balance related tasks going forward that also strengthen and increase endurance as needed.   Pt will continue to benefit from skilled therapy to address remaining deficits in order to improve overall QoL and return to PLOF.       OBJECTIVE IMPAIRMENTS Abnormal gait, decreased activity tolerance, decreased balance, decreased endurance, decreased mobility, difficulty walking, decreased strength, and pain.   ACTIVITY LIMITATIONS carrying, lifting, bending, squatting, stairs, transfers, dressing, reach over head, and hygiene/grooming  PARTICIPATION LIMITATIONS: cleaning, laundry, shopping, community activity, and yard work  PERSONAL FACTORS Age,  Fitness, Past/current experiences, and needing L TKA  are also affecting patient's functional outcome.   REHAB POTENTIAL: Good  CLINICAL DECISION MAKING: Stable/uncomplicated  EVALUATION COMPLEXITY: Low  PLAN: PT FREQUENCY: 2x/week  PT DURATION: 12 weeks  PLANNED INTERVENTIONS: Therapeutic exercises, Therapeutic activity, Neuromuscular re-education, Balance training, Gait training, Patient/Family education, Self Care, Joint mobilization, DME instructions, Dry Needling, Moist heat, and Manual therapy  PLAN FOR NEXT SESSION: Endurance and perform strengthening exercises necessary for improved balance.    JGwenlyn Saran PT, DPT Physical Therapist- [facility] 09/25/22, 6:06 PM

## 2022-09-26 NOTE — Therapy (Signed)
OUTPATIENT SPEECH LANGUAGE PATHOLOGY  COGNITION EVALUATION   Patient Name: [name] MRN: [id number] DOB:July 05, 1941, 81 y.o., female Today's Date: 09/26/2022  PCP: Fulton Reek, MD REFERRING PROVIDER: Reesa Chew, PA   End of Session - 09/26/22 1224     Visit Number 1    Number of Visits 17    Date for SLP Re-Evaluation 11/18/22    Authorization Type [employer] HMO    Progress Note Due on Visit 10    SLP Start Time 1500    SLP Stop Time  1600    SLP Time Calculation (min) 60 min    Activity Tolerance Patient tolerated treatment well             Past Medical History:  Diagnosis Date   Anemia    Arthritis    Asthma    uses inhaler just prior to surgery to avoid attack   Back pain    from previous injury   Complication of anesthesia    has woken  up during 2 different surgery   Depression    no current issue/treatment; situation   Gallstones    GERD (gastroesophageal reflux disease)    Hiatal hernia    patient does NOT have nerve/muscle disease   History of kidney stones    HLD (hyperlipidemia)    HTN (hypertension)    Hypothyroidism    Kidney stones    Knee pain    Non-diabetic pancreatic hormone dysfunction years   pt. 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   Pancreatitis    Pneumonia    Seizures ([place])    caused by dye injected during a procedure   Shortness of breath    with exertion   Sinus problem    frequent infections/congestion   Stroke ([place]) 2021   reports having CVA in 2021 and having mini strokes before that   Thyroid disease    Past Surgical History:  Procedure Laterality Date   ABDOMINAL HYSTERECTOMY     APPENDECTOMY     CARPAL TUNNEL RELEASE  10+ years ago   bilateral   EYE SURGERY  3 yrs ago   bilateral cataracts   FOOT OSTEOTOMY  6 weeks ago   Left foot: great, 2nd & 3rd   FOOT OSTEOTOMY  5 years ago   Right great toe   HAND SURGERY Bilateral 2011-most recent   multiple hand surgeries, 2 on left, 3 on right    KNEE ARTHROPLASTY Right 04/28/2022   Procedure: COMPUTER ASSISTED TOTAL KNEE ARTHROPLASTY;  Surgeon: Dereck Leep, MD;  Location: [facility];  Service: Orthopedics;  Laterality: Right;   LOOP RECORDER INSERTION N/A 05/16/2020   Procedure: LOOP RECORDER INSERTION;  Surgeon: Isaias Cowman, MD;  Location: [place] [facility];  Service: Cardiovascular;  Laterality: N/A;   NASAL SINUS SURGERY  most recent 7-8 yrs ago   7 sinus surgeries    TRIGGER FINGER RELEASE  11/19/2011   Procedure: RELEASE TRIGGER FINGER/A-1 PULLEY;  Surgeon: Wynonia Sours, MD;  Location: [place];  Service: Orthopedics;  Laterality: Right;  release a-1 pulley right index finger and cyst removal   WRIST GANGLION EXCISION  1980's   right   Patient Active Problem List   Diagnosis Date Noted   Fatigue 09/17/2022   Left hemiparesis ([place]) 09/17/2022   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   GERD (gastroesophageal reflux disease) 08/21/2022   Agitation 08/20/2022   Acute left-sided weakness 08/20/2022   Expressive aphasia    Stroke ([place])  08/19/2022   Leukocytosis 08/19/2022   History of urticaria 04/28/2022   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   Primary osteoarthritis of left knee 02/24/2022   Primary osteoarthritis of right knee 02/24/2022   Lumbar spondylolysis 04/12/2020   History of CVA (cerebrovascular accident) 03/26/2020   Low back pain radiating to right lower extremity 03/21/2020   B12 deficiency 03/06/2020   Positive anti-CCP test 12/21/2019   Arthralgia 12/13/2019   Dermatitis 12/13/2019   Rheumatoid factor positive 12/13/2019   Essential hypertension 12/11/2018   Palpitations 12/11/2018   Acquired hypothyroidism 11/10/2018   Arthritis of knee 09/17/2016   Anxiety 11/22/2014   Asthma without status asthmaticus 11/22/2014   Benign neoplasm of colon, unspecified 11/22/2014   Environmental allergies 11/22/2014   Hypertriglyceridemia 11/22/2014   Hypokalemia 11/22/2014    Personal history of disease of skin and subcutaneous tissue 11/22/2014    ONSET DATE: 08/19/2022  REFERRING DIAG: I63.81 (ICD-10-CM) - Right thalamic stroke [facility]  THERAPY DIAG:  Cognitive communication deficit  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 female with PMH: HTN,, hypothyroidism, anxiety, seizure, shingles. She presented to [facility] ED via [facility] on 08/19/22 secondary to acute left sided weakness, lethargy, facial droop and slurred speech. Pt completed Inpatient Rehab Level therapies on 09/05/2022.    DIAGNOSTIC FINDINGS: MRI 2021 Small subacute infarct within the right frontal white matter.   MRI 08/19/2022  Acute infarct in the right corona radiata. Small acute infarct in the right anterior thalamus   Chronic microvascular ischemic change in the white matter and thalamus bilaterally. Chronic infarcts in the basal ganglia bilaterally.  PAIN:  Are you having pain? No   FALLS: Has patient fallen in last 6 months?  No  LIVING ENVIRONMENT: Lives with: lives with their spouse Lives in: House/apartment  PLOF:  Level of assistance: Independent with ADLs, Independent with IADLs Employment: Retired   PATIENT GOALS to improve attention, complex problem solving  OBJECTIVE:   COGNITIVE COMMUNICATION Overall cognitive status: Impaired Areas of impairment:  Attention: Impaired: Alternating, Divided Awareness: [facility] Executive function: Impaired: Impulse control, Problem solving, Organization, Planning, Error awareness, Self-correction, and Slow processing Auditory comprehension: WFL Verbal expression: WFL Functional communication: WFL   ORAL MOTOR EXAMINATION Facial : Symmetry impaired: Impaired left, Strength impaired: Impaired left, Sensation impaired:  Impaired left Lingual: WFL Velum: WFL Mandible: WFL Cough: WFL Voice: WFL   STANDARDIZED ASSESSMENTS:  Cognitive Linguistic Quick Test  AGE - 70-89   The Cognitive Linguistic Quick Test (CLQT) was administered to assess the relative status of five cognitive domains: attention, memory, language, executive functioning, and visuospatial skills. Scores from 10 tasks were used to estimate severity ratings (standardized for age groups 18-69 years and 70-89 years) for each domain, a clock drawing task, as well as an overall composite severity rating of cognition.       Task Score Criterion Cut Scores  Personal Facts 8/8 8  Symbol Cancellation 12/12 10  Confrontation Naming 10/10 10  Clock Drawing  13/13 11  Story Retelling 7/10 5  Symbol Trails 8/10 6  Generative Naming 6/9 4  Design Memory 5/6 4  Mazes  8/8 4  Design Generation 6/13 5    Cognitive Domain Composite Score Severity Rating  Attention 194/215 WNL  Memory 154/185 WNL  Executive Function 28/40 WNL  Language 31/37 WNL  Visuospatial Skills 90/105 WNL  Clock Drawing  13/13 WNL  Composite Severity Rating  WNL     PATIENT REPORTED OUTCOME MEASURES (PROM): Will administer next session   TODAY'S TREATMENT:  Skilled treatment targeted pt's executive function deficits. Pt required moderate A for selective attention to semi-complex problem solving task. While she demonstrated awareness of decreased attention, she was not able to inhibit tangential comments that resulted in lowering her accuracy to ~ 70%.    PATIENT EDUCATION: Education details: results of this assessment, ST POC, executive functions Person educated: Patient and Spouse Education method: Explanation, Demonstration, Verbal cues, and Handouts Education comprehension: verbalized understanding and needs further education     GOALS: Goals reviewed with patient? Yes  SHORT TERM GOALS: Target date: 10 sessions  With supervision level cues, pt will recall  her medicines and their function with 90% accuracy across 5 sessions.  Baseline: unable to recall Goal status: INITIAL  2.  With supervision level cues, pt will use strategies to improve executive functioning for improved task completion.  Baseline: not using any strategies Goal status: INITIAL  3.  With supervision level cues, pt will demonstrate divided attention to complex problem solving task for 30 minutes in a moderately distracting environment.  Baseline: < 5 minutes Goal status: INITIAL  4.  Pt and her husband will report completion of HEP for 5 out of 7 opportunities.  Baseline: new goal Goal status: INITIAL   LONG TERM GOALS: Target date: 11/18/2022 Pt with be independent with use of executive function strategies for functional independence within ADLs and IADLs.  Baseline: min to supervision level  Goal status: INITIAL   ASSESSMENT:  CLINICAL IMPRESSION: Patient is a 81 y.o. female who was seen today for a formal cognition evaluation. While pt presents with functional cognitive abilities on formal assessments, functionally she demonstrates moderate to minimal impairemtns in divided attention, task organization/sequencing tasks and recall of information when distractions are present. As such, she is currently supervision to Mod I when completing functional tasks within her home environment. Given her level of independence prior to CVA, recommend skilled sT intervention to target executive functions to increase pt's functional independence within cooking, mediciation management and money management.   OBJECTIVE IMPAIRMENTS include attention, memory, and executive functioning. These impairments are limiting patient from managing medications, managing appointments, managing finances, household responsibilities, and ADLs/IADLs. Factors affecting potential to achieve goals and functional outcome are medical prognosis.. Patient will benefit from skilled SLP services to address above  impairments and improve overall function.  REHAB POTENTIAL: Good  PLAN: SLP FREQUENCY: 2x/week  SLP DURATION: 8 weeks  PLANNED INTERVENTIONS: Cognitive reorganization, Internal/external aids, Functional tasks, SLP instruction and feedback, Compensatory strategies, and Patient/family education  Kelvin Burpee B. Rutherford Nail, M.S., CCC-SLP, Mining engineer Certified Brain Injury [place]  [place] Office [phone] Ascom [phone] Fax [phone]

## 2022-09-27 ENCOUNTER — Other Ambulatory Visit: Payer: Self-pay | Admitting: Physical Medicine and Rehabilitation

## 2022-09-29 NOTE — Therapy (Signed)
OUTPATIENT SPEECH LANGUAGE PATHOLOGY TREATMENT NOTE   Patient Name: [name] MRN: [id number] DOB:Aug 05, 1941, 81 y.o., female Today's Date: 09/29/2022  PCP: Fulton Reek, MD REFERRING PROVIDER: Reesa Chew, PA  END OF SESSION:   End of Session - 09/29/22 1258     Visit Number 2    Number of Visits 17    Date for SLP Re-Evaluation 11/18/22    Authorization Type [employer] H[employer]    Progress Note Due on Visit 10    SLP Start Time 1500    SLP Stop Time  1600    SLP Time Calculation (min) 60 min    Activity Tolerance Patient tolerated treatment well             Past Medical History:  Diagnosis Date   Anemia    Arthritis    Asthma    uses inhaler just prior to surgery to avoid attack   Back pain    from previous injury   Complication of anesthesia    has woken  up during 2 different surgery   Depression    no current issue/treatment; situation   Gallstones    GERD (gastroesophageal reflux disease)    Hiatal hernia    patient does NOT have nerve/muscle disease   History of kidney stones    HLD (hyperlipidemia)    HTN (hypertension)    Hypothyroidism    Kidney stones    Knee pain    Non-diabetic pancreatic hormone dysfunction years   pt. 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   Pancreatitis    Pneumonia    Seizures (HCC)    caused by dye injected during a procedure   Shortness of breath    with exertion   Sinus problem    frequent infections/congestion   Stroke ([place]) 2021   reports having CVA in 2021 and having mini strokes before that   Thyroid disease    Past Surgical History:  Procedure Laterality Date   ABDOMINAL HYSTERECTOMY     APPENDECTOMY     CARPAL TUNNEL RELEASE  10+ years ago   bilateral   EYE SURGERY  3 yrs ago   bilateral cataracts   FOOT OSTEOTOMY  6 weeks ago   Left foot: great, 2nd & 3rd   FOOT OSTEOTOMY  5 years ago   Right great toe   HAND SURGERY Bilateral 2011-most recent   multiple hand surgeries, 2 on left, 3 on  right   KNEE ARTHROPLASTY Right 04/28/2022   Procedure: COMPUTER ASSISTED TOTAL KNEE ARTHROPLASTY;  Surgeon: Dereck Leep, MD;  Location: [facility];  Service: Orthopedics;  Laterality: Right;   LOOP RECORDER INSERTION N/A 05/16/2020   Procedure: LOOP RECORDER INSERTION;  Surgeon: Isaias Cowman, MD;  Location: [place] [facility];  Service: Cardiovascular;  Laterality: N/A;   NASAL SINUS SURGERY  most recent 7-8 yrs ago   7 sinus surgeries    TRIGGER FINGER RELEASE  11/19/2011   Procedure: RELEASE TRIGGER FINGER/A-1 PULLEY;  Surgeon: Wynonia Sours, MD;  Location: [place];  Service: Orthopedics;  Laterality: Right;  release a-1 pulley right index finger and cyst removal   WRIST GANGLION EXCISION  1980's   right   Patient Active Problem List   Diagnosis Date Noted   Fatigue 09/17/2022   Left hemiparesis ([place]) 09/17/2022   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   GERD (gastroesophageal reflux disease) 08/21/2022   Agitation 08/20/2022   Acute left-sided weakness 08/20/2022   Expressive aphasia  Stroke ([place]) 08/19/2022   Leukocytosis 08/19/2022   History of urticaria 04/28/2022   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   Primary osteoarthritis of left knee 02/24/2022   Primary osteoarthritis of right knee 02/24/2022   Lumbar spondylolysis 04/12/2020   History of CVA (cerebrovascular accident) 03/26/2020   Low back pain radiating to right lower extremity 03/21/2020   B12 deficiency 03/06/2020   Positive anti-CCP test 12/21/2019   Arthralgia 12/13/2019   Dermatitis 12/13/2019   Rheumatoid factor positive 12/13/2019   Essential hypertension 12/11/2018   Palpitations 12/11/2018   Acquired hypothyroidism 11/10/2018   Arthritis of knee 09/17/2016   Anxiety 11/22/2014   Asthma without status asthmaticus 11/22/2014   Benign neoplasm of colon, unspecified 11/22/2014   Environmental allergies 11/22/2014   Hypertriglyceridemia 11/22/2014   Hypokalemia 11/22/2014    Personal history of disease of skin and subcutaneous tissue 11/22/2014    ONSET DATE: 08/19/2022  REFERRING DIAG: I63.81 (ICD-10-CM) - 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[place]   PERTINENT HISTORY: Patient is a 81 y.o. female with PMH: HTN,, hypothyroidism, anxiety, seizure, shingles. She presented to [facility] ED via [facility] on 08/19/22 secondary to acute left sided weakness, lethargy, facial droop and slurred speech. Pt completed Inpatient Rehab Level therapies on 09/05/2022.      DIAGNOSTIC FINDINGS: MRI 2021 Small subacute infarct within the right frontal white matter.    MRI 08/19/2022  Acute infarct in the right corona radiata. Small acute infarct in the right anterior thalamus   Chronic microvascular ischemic change in the white matter and thalamus bilaterally. Chronic infarcts in the basal ganglia bilaterally.    THERAPY DIAG:  Cognitive communication deficit  Right thalamic stroke [facility])  Rationale for Evaluation and Treatment Rehabilitation  SUBJECTIVE: "I am realizing that it is going to take longer than I expected to recover (speaking specifically to LUE impairments)"  Pt accompanied by: significant other  PAIN:  Are you having pain? No  PATIENT GOALS: to improve attention, complex problem solving  OBJECTIVE:   TODAY'S TREATMENT: Skilled treatment session focused on pt's cognition goals, specifically the use of meta-cognitive strategies for executive functioning to increase accuracy and safe task completion. SLP facilitated by providing the following interventions:  Introduced acronym of SCRUBD:  S - say it out loud  C - cross off once completed  R - read all directions first  U - underline pertinent info  B -  break task down into single steps   More manageable parts  D - double check all work/answers  SLP further facilitated use of these strategies by providing instruction on ways to implement in functional activity of making oatmeal cookies, as pt loves to bake.  Written information was provided by each acronym.        PATIENT EDUCATION: Education details: meta-cognitive strategies; executive functions Person educated: Patient and Spouse Education method: Explanation, Demonstration, Verbal cues, and Handouts Education comprehension: verbalized understanding and needs further education  GOALS: Goals reviewed with patient? Yes  SHORT TERM GOALS: Target date: 10 sessions   With supervision level cues, pt will recall her medicines and their function with 90% accuracy across 5 sessions.  Baseline: unable to recall Goal status: INITIAL   2.  With supervision level cues, pt will use strategies to improve executive functioning for improved task completion.  Baseline: not using any strategies Goal status: INITIAL   3.  With supervision level cues, pt will demonstrate divided attention to complex problem solving task for 30 minutes in a moderately distracting environment.  Baseline: <  5 minutes Goal status: INITIAL   4.  Pt and her husband will report completion of HEP for 5 out of 7 opportunities.  Baseline: new goal Goal status: INITIAL     LONG TERM GOALS: Target date: 11/18/2022 Pt with be independent with use of executive function strategies for functional independence within ADLs and IADLs.  Baseline: min to supervision level  Goal status: INITIAL     ASSESSMENT:  CLINICAL IMPRESSION: Pt presents with mild higher level cognitive deficits that are more apparent when completing functional tasks. She specifically needs instructing and training in strategies to improve executive functioning during everyday activities and activities such as baking to increase pt's quality of life.   OBJECTIVE IMPAIRMENTS include attention, memory, awareness, and executive functioning. These impairments are limiting patient from managing medications, managing appointments, managing finances, household responsibilities, and ADLs/IADLs. Factors affecting  potential to achieve goals and functional outcome are co-morbidities. Patient will benefit from skilled SLP services to address above impairments and improve overall function.  REHAB POTENTIAL: Excellent  PLAN: SLP FREQUENCY: 2x/week  SLP DURATION: 12 weeks  PLANNED INTERVENTIONS: Cognitive reorganization, Internal/external aids, Functional tasks, SLP instruction and feedback, Compensatory strategies, and Patient/family education  Jamile Rekowski B. Rutherford Nail, M.S., CCC-SLP, Mining engineer Certified Brain Injury [place]  [place] Office [phone] Ascom [phone] Fax [phone]

## 2022-09-30 ENCOUNTER — Ambulatory Visit: Payer: Medicare HMO | Admitting: Speech Pathology

## 2022-09-30 ENCOUNTER — Ambulatory Visit: Payer: Medicare HMO | Admitting: Occupational Therapy

## 2022-09-30 ENCOUNTER — Encounter: Payer: Self-pay | Admitting: Occupational Therapy

## 2022-09-30 ENCOUNTER — Ambulatory Visit: Payer: Medicare HMO

## 2022-09-30 DIAGNOSIS — I6381 Other cerebral infarction due to occlusion or stenosis of small artery: Secondary | ICD-10-CM

## 2022-09-30 DIAGNOSIS — M6281 Muscle weakness (generalized): Secondary | ICD-10-CM

## 2022-09-30 DIAGNOSIS — R41841 Cognitive communication deficit: Secondary | ICD-10-CM | POA: Diagnosis not present

## 2022-09-30 DIAGNOSIS — R262 Difficulty in walking, not elsewhere classified: Secondary | ICD-10-CM

## 2022-09-30 DIAGNOSIS — R278 Other lack of coordination: Secondary | ICD-10-CM | POA: Diagnosis not present

## 2022-09-30 NOTE — Therapy (Signed)
OUTPATIENT OCCUPATIONAL THERAPY NEURO TREATMENT NOTE  Patient Name: [name] MRN: [id number] DOB:[birth year], 81 y.o., female Today's Date: 09/30/2022  PCP: Dr. Sylvester Harder PROVIDER: Reesa Chew   OT End of Session - 09/30/22 1739     Visit Number 6    Number of Visits 36    Date for OT Re-Evaluation 12/02/22    Authorization Time Period Progress report period starting 09/09/2022    OT Start Time 1520    OT Stop Time 1600    OT Time Calculation (min) 40 min    Activity Tolerance Patient limited by pain;Patient tolerated treatment well    Behavior During Therapy [facility] for tasks assessed/performed             Past Medical History:  Diagnosis Date   Anemia    Arthritis    Asthma    uses inhaler just prior to surgery to avoid attack   Back pain    from previous injury   Complication of anesthesia    has woken  up during 2 different surgery   Depression    no current issue/treatment; situation   Gallstones    GERD (gastroesophageal reflux disease)    Hiatal hernia    patient does NOT have nerve/muscle disease   History of kidney stones    HLD (hyperlipidemia)    HTN (hypertension)    Hypothyroidism    Kidney stones    Knee pain    Non-diabetic pancreatic hormone dysfunction years   pt. 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   Pancreatitis    Pneumonia    Seizures (HCC)    caused by dye injected during a procedure   Shortness of breath    with exertion   Sinus problem    frequent infections/congestion   Stroke ([place]) 2021   reports having CVA in 2021 and having mini strokes before that   Thyroid disease    Past Surgical History:  Procedure Laterality Date   ABDOMINAL HYSTERECTOMY     APPENDECTOMY     CARPAL TUNNEL RELEASE  10+ years ago   bilateral   EYE SURGERY  3 yrs ago   bilateral cataracts   FOOT OSTEOTOMY  6 weeks ago   Left foot: great, 2nd & 3rd   FOOT OSTEOTOMY  5 years ago   Right great toe   HAND SURGERY Bilateral 2011-most  recent   multiple hand surgeries, 2 on left, 3 on right   KNEE ARTHROPLASTY Right 04/28/2022   Procedure: COMPUTER ASSISTED TOTAL KNEE ARTHROPLASTY;  Surgeon: Dereck Leep, MD;  Location: [facility];  Service: Orthopedics;  Laterality: Right;   LOOP RECORDER INSERTION N/A 05/16/2020   Procedure: LOOP RECORDER INSERTION;  Surgeon: Isaias Cowman, MD;  Location: [place] [facility];  Service: Cardiovascular;  Laterality: N/A;   NASAL SINUS SURGERY  most recent 7-8 yrs ago   7 sinus surgeries    TRIGGER FINGER RELEASE  11/19/2011   Procedure: RELEASE TRIGGER FINGER/A-1 PULLEY;  Surgeon: Wynonia Sours, MD;  Location: [place];  Service: Orthopedics;  Laterality: Right;  release a-1 pulley right index finger and cyst removal   WRIST GANGLION EXCISION  1980's   right   Patient Active Problem List   Diagnosis Date Noted   Fatigue 09/17/2022   Left hemiparesis ([place]) 09/17/2022   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   GERD (gastroesophageal reflux disease) 08/21/2022   Agitation 08/20/2022   Acute left-sided weakness 08/20/2022   Expressive aphasia  Stroke ([place]) 08/19/2022   Leukocytosis 08/19/2022   History of urticaria 04/28/2022   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   Primary osteoarthritis of left knee 02/24/2022   Primary osteoarthritis of right knee 02/24/2022   Lumbar spondylolysis 04/12/2020   History of CVA (cerebrovascular accident) 03/26/2020   Low back pain radiating to right lower extremity 03/21/2020   B12 deficiency 03/06/2020   Positive anti-CCP test 12/21/2019   Arthralgia 12/13/2019   Dermatitis 12/13/2019   Rheumatoid factor positive 12/13/2019   Essential hypertension 12/11/2018   Palpitations 12/11/2018   Acquired hypothyroidism 11/10/2018   Arthritis of knee 09/17/2016   Anxiety 11/22/2014   Asthma without status asthmaticus 11/22/2014   Benign neoplasm of colon, unspecified 11/22/2014   Environmental allergies 11/22/2014    Hypertriglyceridemia 11/22/2014   Hypokalemia 11/22/2014   Personal history of disease of skin and subcutaneous tissue 11/22/2014    ONSET DATE: 08/18/2022  REFERRING DIAG: CVA  THERAPY DIAG:  Muscle weakness (generalized)  Rationale for Evaluation and Treatment Rehabilitation  SUBJECTIVE:   SUBJECTIVE STATEMENT:   Pt. Reports trying to use her left hand to engage in ADLs, and IADL tasks.   Pt accompanied by: significant other  PERTINENT HISTORY: Patient is a 81 year old female who was diagnosed with a right thalamic CVA on 08/18/2022 with left-sided weakness.  Patient underwent inpatient rehabilitation for 2 weeks. Patient was assessed and was scheduled for a knee replacement on 08/29/2022 however had to cancel it due to having had a CVA.  Patient had a recent fall 2 days after discharging from inpatient rehab. past medical history includes: Knee replacement, essential HTN, hypokalemia, leukocytosis, seizures, positive anti-- CCP test, anxiety disorder, mini strokes.  Patient had shingles with left eye nerve pain s/p 1 year ago.   PRECAUTIONS: Fall  WEIGHT BEARING RESTRICTIONS No  PAIN:  Are you having pain? Yes, 5/10 left shoulder  FALLS: Has patient fallen in last 6 months? Yes. Number of falls 1  LIVING ENVIRONMENT: Lives with: Lives with Spouse Lives in: House/apartment Stairs: 2 storey home, resides on the first floor.  External: 2 stairs front no rails, and 6 in back with rails Has following equipment at home: Single point cane, Walker - 2 wheeled, Environmental consultant - 4 wheeled, Shower bench, and bed side commode  PLOF: Independent  PATIENT GOALS  Pt. Regain the use of her left arm  OBJECTIVE:   HAND DOMINANCE: Right  ADLs: Overall ADLs: Husband assists pt. as needed Transfers/ambulation related to ADLs:Pt. Has assistive devices. Eating: Pt. Is independent with the right hand. Pt. Is unable to cut food, and open packets Grooming: Pt. Is using her right hand, however has  difficulty using her left to assist with haircare UB Dressing: Pt. Is independent donning a pullover shirt, and button down shirt. Has difficulty with buttoning, LB Dressing:  Independent donning pants, and socks. Difficulty tying shoes. Toileting: Independent Bathing: independent using the right hand, difficulty using the LUE for the right side. Tub Shower transfers: Supervision Equipment: See above for equipment   IADLs: Shopping:  Has not had the opportunity for grocery shopping yet Light housekeeping: Husband assisting with light house keeping Meal Prep:  Dependent Community mobility: Relies of family/friends Medication management: Husband Oncologist: TBD Handwriting: 75% legible  MOBILITY STATUS: Hx of falls  POSTURE COMMENTS:  No Significant postural limitations Sitting balance: supported sitting balance WFL  ACTIVITY TOLERANCE: Activity tolerance:  Fatigues with 10-15 min.  FUNCTIONAL OUTCOME MEASURES: FOTO: 45  UPPER EXTREMITY ROM  Active ROM Right Eval: WFL Left eval  Shoulder flexion  55(117)  Shoulder abduction  67(108  Shoulder adduction    Shoulder extension    Shoulder internal rotation    Shoulder external rotation    Elbow flexion  122(144)  Elbow extension  -16(0)  Wrist flexion  62  Wrist extension  -22(50)  Wrist ulnar deviation    Wrist radial deviation    Wrist pronation    Wrist supination    (Blank rows = not tested)  Left digit flexion to [facility]: 2nd: 2cm, 3rd: 0cm, 4th: 0cm, 5th: 0cm   Limited Left full digit extension   UPPER EXTREMITY MMT:     MMT Right Eval: 4+/5 overall Left eval  Shoulder flexion  3-/5  Shoulder abduction  3-/5  Shoulder adduction    Shoulder extension    Shoulder internal rotation    Shoulder external rotation    Middle trapezius    Lower trapezius    Elbow flexion  3+/5  Elbow extension  3+/5  Wrist flexion    Wrist extension  2-/5  Wrist ulnar deviation    Wrist radial  deviation    Wrist pronation    Wrist supination    (Blank rows = not tested)  HAND FUNCTION: Left Unable  COORDINATION:  Impaired  SENSATION: Light touch: WFL, proprioceptive awareness: Intact  EDEMA: N/A  MUSCLE TONE: LUE: Hypotonic  COGNITION: Overall cognitive status: Within functional limits for tasks assessed Pt. Reports memory limitations.  Patient has bilateral hearing aids however was not wearing them during the initial evaluation.  VISION: Subjective report: Pt. report having shingles affecting left eye  s/p 1 year. Has nerve pain Baseline vision: Wears glasses for reading only Visual history: Report no changes  VISION ASSESSMENT:   WFL for tasks performed  PERCEPTION: Intact  PRAXIS: Impaired: Motor planning  OBSERVATIONS: Patient reports being sore following a recent fall after discharging from inpatient rehab.   TODAY'S TREATMENT:    Patient tolerated AROM in all joint ranges of the left upper extremity in supine. Patient worked on stabilizing her shoulder with perturbations, as well as protraction. Patient worked on active assistive range of motion with the shoulder flexion at the tabletop while sitting. Patient worked on horizontal abduction combining wrist extension when reaching across midline for her opposite shoulder in sitting, and diagonal patterns. Pt. worked on AROM reaching to the top of her head, and the back of her head in sitting in preparation for grooming, and hair care. Pt. worked on shoulder flexion exercises. Patient worked on left shoulder stabilization exercises  with her shoulder flexed at 90 degrees with elbow in full extension. Patient worked on active assistive range of motion with the shoulder flexion at the tabletop while sitting. Pt. worked on using her left hand to fold wash cloths.    Pt. reports that she used her left hand a lot at home this weekend, and expressed concern that she may have used it too much at home. Pt. reports  fatigue today after losing power at home last night. Pt. Presents with 5/10 left shoulder pain.  Pt. Continues to require frequent cues to avoid compensation proximally with trunk retropulsion, and hiking of the left shoulder during UE exercises. Pt continues to require cues to focus on breath during the exercises, as Pt. tends to hold her breath throughout the exercises. Pt. Continues to present with thumb IP tightness, and 2nd digit tightness which affects the accuracy of grasping, and releasing of objects. Patient  continues to work on proving left upper extremity strength, wrist and digit extension, and overall  LUE functioning in order to improve engagement in, and maximize independence in ADLs and IADL tasks.    PATIENT EDUCATION: Education details: OT services, POC, and goals. Person educated: Patient and Spouse Education method: Customer service manager Education comprehension: verbalized understanding, returned demonstration, and needs further education   HOME EXERCISE PROGRAM:  Assess ongoing need for HEP    GOALS: Goals reviewed with patient? Yes  SHORT TERM GOALS: Target date: 10/21/2022      Patient will be independent with home exercise program for the left upper extremity Baseline: No current home exercise program Goal status: INITIAL  2.  Patient will independently formulate a full composite fist with the left hand.  Baseline: Second digit to the [facility]: 2cm.  Goal status: INITIAL              3: Pt. will improve active digit extension to be able to place her hand flat tabletop surface in preparation for weightbearing proprioceptive input. Baseline: Is unable to actively full digit extension Goal status: INITIAL   LONG TERM GOALS: Target date: 12/02/2022  Patient will improve active left shoulder flexion by 20 degrees preparation for reaching for the refrigerator door. Baseline: Eval: Left shoulder flexion 55(117) Goal status: INITIAL  2.  Patient will  improve left shoulder active abduction to be able to comb her hair Baseline: Eval: Left shoulder abduction is 67(108) Goal status: INITIAL  3.  Patient will patiently button her shirt with modified independence. Baseline: Eval: Patient requires increased time to complete Goal status: INITIAL  4.  Patient well improve left grip strength preparation for cutting food Baseline: Eval:  Goal status: INITIAL  5.  Pt. will independently recall adaptive  strategies for performing ADL tasks including: flossing teeth, donning bra, applying makeup. Baseline: Eval: Pt. unable to perform Goal status: INITIAL  6.  Pt. will improve Foto score by 5 points to reflect patient perceived performance improvement assessment specific ADLs  and IADLs Baseline: Eval: 45 Goal status: INITIAL    ASSESSMENT:  CLINICAL IMPRESSION:   Pt. reports that she used her left hand a lot at home this weekend, and expressed concern that she may have used it too much at home. Pt. reports fatigue today after losing power at home last night. Pt. Presents with 5/10 left shoulder pain.  Pt. Continues to require frequent cues to avoid compensation proximally with trunk retropulsion, and hiking of the left shoulder during UE exercises. Pt continues to require cues to focus on breath during the exercises, as Pt. tends to hold her breath throughout the exercises. Pt. Continues to present with thumb IP tightness, and 2nd digit tightness which affects the accuracy of grasping, and releasing of objects. Patient continues to work on proving left upper extremity strength, wrist and digit extension, and overall  LUE functioning in order to improve engagement in, and maximize independence in ADLs and IADL tasks.     PERFORMANCE DEFICITS in functional skills including ADLs, IADLs, coordination, proprioception, ROM, strength, [place], and GMC, cognitive skills including memory, and psychosocial skills including coping strategies, environmental  adaptation, interpersonal interactions, and routines and behaviors.   IMPAIRMENTS are limiting patient from ADLs, IADLs, education, leisure, and social participation.   COMORBIDITIES may have co-morbidities  that affects occupational performance. Patient will benefit from skilled OT to address above impairments and improve overall function.  MODIFICATION OR ASSISTANCE TO COMPLETE EVALUATION: Min-Moderate modification of tasks or assist  with assess necessary to complete an evaluation.  OT OCCUPATIONAL PROFILE AND HISTORY: Detailed assessment: Review of records and additional review of physical, cognitive, psychosocial history related to current functional performance.  CLINICAL DECISION MAKING: Moderate - several treatment options, min-mod task modification necessary  REHAB POTENTIAL: Good  EVALUATION COMPLEXITY: Moderate    PLAN: OT FREQUENCY: 3x/week  OT DURATION: 12 weeks  PLANNED INTERVENTIONS: self care/ADL training, therapeutic exercise, therapeutic activity, neuromuscular re-education, manual therapy, passive range of motion, functional mobility training, electrical stimulation, and paraffin  RECOMMENDED OTHER SERVICES: PT  CONSULTED AND AGREED WITH PLAN OF CARE: Patient and family member/caregiver  PLAN FOR NEXT SESSION: Initiate OT treatment  Harrel Carina, MS, OTR/L   Harrel Carina, OT 09/30/2022, 5:45 PM

## 2022-09-30 NOTE — Therapy (Addendum)
OUTPATIENT SPEECH LANGUAGE PATHOLOGY TREATMENT NOTE   Patient Name: [name] MRN: [id number] DOB:October 18, 1941, 81 y.o., female Today's Date: 09/30/2022  PCP: Fulton Reek, MD REFERRING PROVIDER: Reesa Chew, PA  END OF SESSION:   End of Session - 09/30/22 1408     Visit Number 3    Number of Visits 17    Date for SLP Re-Evaluation 11/18/22    Authorization Type [employer] H[employer]    Progress Note Due on Visit 10    SLP Start Time 15    SLP Stop Time  1515   SLP Time Calculation (min) 75 min    Activity Tolerance Patient tolerated treatment well             Past Medical History:  Diagnosis Date   Anemia    Arthritis    Asthma    uses inhaler just prior to surgery to avoid attack   Back pain    from previous injury   Complication of anesthesia    has woken  up during 2 different surgery   Depression    no current issue/treatment; situation   Gallstones    GERD (gastroesophageal reflux disease)    Hiatal hernia    patient does NOT have nerve/muscle disease   History of kidney stones    HLD (hyperlipidemia)    HTN (hypertension)    Hypothyroidism    Kidney stones    Knee pain    Non-diabetic pancreatic hormone dysfunction years   pt. 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   Pancreatitis    Pneumonia    Seizures (HCC)    caused by dye injected during a procedure   Shortness of breath    with exertion   Sinus problem    frequent infections/congestion   Stroke ([place]) 2021   reports having CVA in 2021 and having mini strokes before that   Thyroid disease    Past Surgical History:  Procedure Laterality Date   ABDOMINAL HYSTERECTOMY     APPENDECTOMY     CARPAL TUNNEL RELEASE  10+ years ago   bilateral   EYE SURGERY  3 yrs ago   bilateral cataracts   FOOT OSTEOTOMY  6 weeks ago   Left foot: great, 2nd & 3rd   FOOT OSTEOTOMY  5 years ago   Right great toe   HAND SURGERY Bilateral 2011-most recent   multiple hand surgeries, 2 on left, 3 on  right   KNEE ARTHROPLASTY Right 04/28/2022   Procedure: COMPUTER ASSISTED TOTAL KNEE ARTHROPLASTY;  Surgeon: Dereck Leep, MD;  Location: [facility];  Service: Orthopedics;  Laterality: Right;   LOOP RECORDER INSERTION N/A 05/16/2020   Procedure: LOOP RECORDER INSERTION;  Surgeon: Isaias Cowman, MD;  Location[facility];  Service: Cardiovascular;  Laterality: N/A;   NASAL SINUS SURGERY  most recent 7-8 yrs ago   7 sinus surgeries    TRIGGER FINGER RELEASE  11/19/2011   Procedure: RELEASE TRIGGER FINGER/A-1 PULLEY;  Surgeon: Wynonia Sours, MD;  Location: [facility];  Service: Orthopedics;  Laterality: Right;  release a-1 pulley right index finger and cyst removal   WRIST GANGLION EXCISION  1980's   right   Patient Active Problem List   Diagnosis Date Noted   Fatigue 09/17/2022   Left hemiparesis ([place]) 09/17/2022   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   GERD (gastroesophageal reflux disease) 08/21/2022   Agitation 08/20/2022   Acute left-sided weakness 08/20/2022   Expressive aphasia  Stroke ([place]) 08/19/2022   Leukocytosis 08/19/2022   History of urticaria 04/28/2022   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   Primary osteoarthritis of left knee 02/24/2022   Primary osteoarthritis of right knee 02/24/2022   Lumbar spondylolysis 04/12/2020   History of CVA (cerebrovascular accident) 03/26/2020   Low back pain radiating to right lower extremity 03/21/2020   B12 deficiency 03/06/2020   Positive anti-CCP test 12/21/2019   Arthralgia 12/13/2019   Dermatitis 12/13/2019   Rheumatoid factor positive 12/13/2019   Essential hypertension 12/11/2018   Palpitations 12/11/2018   Acquired hypothyroidism 11/10/2018   Arthritis of knee 09/17/2016   Anxiety 11/22/2014   Asthma without status asthmaticus 11/22/2014   Benign neoplasm of colon, unspecified 11/22/2014   Environmental allergies 11/22/2014   Hypertriglyceridemia 11/22/2014   Hypokalemia 11/22/2014    Personal history of disease of skin and subcutaneous tissue 11/22/2014    ONSET DATE: 08/19/2022  REFERRING DIAG: I63.81 (ICD-10-CM) - 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[place]   PERTINENT HISTORY: Patient is a 81 y.o. female with PMH: HTN,, hypothyroidism, anxiety, seizure, shingles. She presented to [facility] ED via [facility] on 08/19/22 secondary to acute left sided weakness, lethargy, facial droop and slurred speech. Pt completed Inpatient Rehab Level therapies on 09/05/2022.      DIAGNOSTIC FINDINGS: MRI 2021 Small subacute infarct within the right frontal white matter.    MRI 08/19/2022  Acute infarct in the right corona radiata. Small acute infarct in the right anterior thalamus   Chronic microvascular ischemic change in the white matter and thalamus bilaterally. Chronic infarcts in the basal ganglia bilaterally.    THERAPY DIAG:  Cognitive communication deficit  Right thalamic stroke [facility])  Rationale for Evaluation and Treatment Rehabilitation  SUBJECTIVE: "I am realizing that it is going to take longer than I expected to recover (speaking specifically to LUE impairments)"  Pt accompanied by: significant other  PAIN:  Are you having pain? No  PATIENT GOALS: to improve attention, complex problem solving  OBJECTIVE:   TODAY'S TREATMENT: Skilled treatment session focused on pt's cognition goals, specifically the use of meta-cognitive strategies for executive functioning to increase accuracy and safe task completion. SLP facilitated by providing the following interventions:  Pt brought in cookies as a product of her meta-cognitive project. Pt able to identify areas that were strengths and ares that could be improved upon when attempting to accomplish future tasks.   Pt identified increased fatigue, decreased endurance, attempting too many tasks within each day such as cooking supper and making cookies.   SLP provided skilled instruction in pt identifying 1 project per day - therapy  days, just therapy is the project, another day, cooking supper is the project, both pt and husband voiced understanding    PATIENT EDUCATION: Education details: meta-cognitive strategies; executive functions Person educated: Patient and Spouse Education method: Explanation, Demonstration, Verbal cues, and Handouts Education comprehension: verbalized understanding and needs further education  GOALS: Goals reviewed with patient? Yes  SHORT TERM GOALS: Target date: 10 sessions   With supervision level cues, pt will recall her medicines and their function with 90% accuracy across 5 sessions.  Baseline: unable to recall Goal status: INITIAL   2.  With supervision level cues, pt will use strategies to improve executive functioning for improved task completion.  Baseline: not using any strategies Goal status: INITIAL   3.  With supervision level cues, pt will demonstrate divided attention to complex problem solving task for 30 minutes in a moderately distracting environment.  Baseline: < 5 minutes  Goal status: INITIAL   4.  Pt and her husband will report completion of HEP for 5 out of 7 opportunities.  Baseline: new goal Goal status: INITIAL     LONG TERM GOALS: Target date: 11/18/2022 Pt with be independent with use of executive function strategies for functional independence within ADLs and IADLs.  Baseline: min to supervision level  Goal status: INITIAL     ASSESSMENT:  CLINICAL IMPRESSION: Pt presents with mild higher level cognitive deficits that are more apparent when completing functional tasks. She specifically needs instructing and training in strategies to improve executive functioning during everyday activities and activities such as baking to increase pt's quality of life.   OBJECTIVE IMPAIRMENTS include attention, memory, awareness, and executive functioning. These impairments are limiting patient from managing medications, managing appointments, managing finances,  household responsibilities, and ADLs/IADLs. Factors affecting potential to achieve goals and functional outcome are co-morbidities. Patient will benefit from skilled SLP services to address above impairments and improve overall function.  REHAB POTENTIAL: Excellent  PLAN: SLP FREQUENCY: 2x/week  SLP DURATION: 12 weeks  PLANNED INTERVENTIONS: Cognitive reorganization, Internal/external aids, Functional tasks, SLP instruction and feedback, Compensatory strategies, and Patient/family education  Caston Coopersmith B. Rutherford Nail, M.S., CCC-SLP, Mining engineer Certified Brain Injury [place]  [place] Office [phone] Ascom [phone] Fax [phone]

## 2022-09-30 NOTE — Therapy (Signed)
OUTPATIENT PHYSICAL THERAPY NEURO TREATMENT   Patient Name: [name] MRN: [id number] DOB:[birth year], 81 y.o., female Today's Date: 09/30/2022   PCP: Idelle Crouch, MD REFERRING PROVIDER: Bary Leriche, PA-C   PT End of Session - 09/30/22 1607     Visit Number 3    Number of Visits 24    Date for PT Re-Evaluation 12/16/22    Authorization Type [employer] - Needs Authorization    PT Start Time 1600    PT Stop Time 1645    PT Time Calculation (min) 45 min    Equipment Utilized During Treatment Gait belt    Activity Tolerance Patient tolerated treatment well    Behavior During Therapy WFL for tasks assessed/performed              Past Medical History:  Diagnosis Date   Anemia    Arthritis    Asthma    uses inhaler just prior to surgery to avoid attack   Back pain    from previous injury   Complication of anesthesia    has woken  up during 2 different surgery   Depression    no current issue/treatment; situation   Gallstones    GERD (gastroesophageal reflux disease)    Hiatal hernia    patient does NOT have nerve/muscle disease   History of kidney stones    HLD (hyperlipidemia)    HTN (hypertension)    Hypothyroidism    Kidney stones    Knee pain    Non-diabetic pancreatic hormone dysfunction years   pt. 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   Pancreatitis    Pneumonia    Seizures (HCC)    caused by dye injected during a procedure   Shortness of breath    with exertion   Sinus problem    frequent infections/congestion   Stroke ([place]) 2021   reports having CVA in 2021 and having mini strokes before that   Thyroid disease    Past Surgical History:  Procedure Laterality Date   ABDOMINAL HYSTERECTOMY     APPENDECTOMY     CARPAL TUNNEL RELEASE  10+ years ago   bilateral   EYE SURGERY  3 yrs ago   bilateral cataracts   FOOT OSTEOTOMY  6 weeks ago   Left foot: great, 2nd & 3rd   FOOT OSTEOTOMY  5 years ago   Right great toe   HAND  SURGERY Bilateral 2011-most recent   multiple hand surgeries, 2 on left, 3 on right   KNEE ARTHROPLASTY Right 04/28/2022   Procedure: COMPUTER ASSISTED TOTAL KNEE ARTHROPLASTY;  Surgeon: Dereck Leep, MD;  Location: [facility];  Service: Orthopedics;  Laterality: Right;   LOOP RECORDER INSERTION N/A 05/16/2020   Procedure: LOOP RECORDER INSERTION;  Surgeon: Isaias Cowman, MD;  Location: [facility];  Service: Cardiovascular;  Laterality: N/A;   NASAL SINUS SURGERY  most recent 7-8 yrs ago   7 sinus surgeries    TRIGGER FINGER RELEASE  11/19/2011   Procedure: RELEASE TRIGGER FINGER/A-1 PULLEY;  Surgeon: Wynonia Sours, MD;  Location: [facility];  Service: Orthopedics;  Laterality: Right;  release a-1 pulley right index finger and cyst removal   WRIST GANGLION EXCISION  1980's   right   Patient Active Problem List   Diagnosis Date Noted   Fatigue 09/17/2022   Left hemiparesis ([place]) 09/17/2022   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   GERD (gastroesophageal reflux disease) 08/21/2022   Agitation 08/20/2022  Acute left-sided weakness 08/20/2022   Expressive aphasia    Stroke ([place]) 08/19/2022   Leukocytosis 08/19/2022   History of urticaria 04/28/2022   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   Primary osteoarthritis of left knee 02/24/2022   Primary osteoarthritis of right knee 02/24/2022   Lumbar spondylolysis 04/12/2020   History of CVA (cerebrovascular accident) 03/26/2020   Low back pain radiating to right lower extremity 03/21/2020   B12 deficiency 03/06/2020   Positive anti-CCP test 12/21/2019   Arthralgia 12/13/2019   Dermatitis 12/13/2019   Rheumatoid factor positive 12/13/2019   Essential hypertension 12/11/2018   Palpitations 12/11/2018   Acquired hypothyroidism 11/10/2018   Arthritis of knee 09/17/2016   Anxiety 11/22/2014   Asthma without status asthmaticus 11/22/2014   Benign neoplasm of colon, unspecified 11/22/2014   Environmental  allergies 11/22/2014   Hypertriglyceridemia 11/22/2014   Hypokalemia 11/22/2014   Personal history of disease of skin and subcutaneous tissue 11/22/2014    ONSET DATE: 08/18/22  REFERRING DIAG: I63.81 (ICD-10-CM) - 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  Pt accompanied by: self and significant other; [name].  PERTINENT HISTORY:  Patient is a 81 year old female who was diagnosed with a right thalamic CVA on 08/18/2022 with left-sided weakness.  Patient underwent inpatient rehabilitation for 2 weeks.  Patient was assessed and was scheduled for a knee replacement on 08/29/2022 however had to cancel it due to having had a CVA.  Patient had a recent fall 2 days after discharging from inpatient rehab. past medical history includes: Knee replacement, essential HTN, hypokalemia, leukocytosis, seizures, positive anti-- CCP test, anxiety disorder, mini strokes.  Patient had shingles with left eye nerve pain s/p 1 year ago.   PAIN:  Are you having pain? Yes, pt is having 7/10 pain in the L shoulder and also in the L knee.    PRECAUTIONS: None  WEIGHT BEARING RESTRICTIONS No  FALLS: Has patient fallen in last 6 months? Yes. Number of falls 3  LIVING ENVIRONMENT: Lives with: lives with their spouse Lives in: House/apartment Stairs: Yes: Internal: 12 steps; on  left going up and External: 6 in the back steps; can reach both Has following  equipment at home: Walker - 2 wheeled  PLOF: Independent  PATIENT GOALS Pt wants to get use of her shoulder and her legs back and improve balance.  OBJECTIVE:   DIAGNOSTIC FINDINGS:   FINDINGS: Brain: Acute infarct in the right corona radiata. Small acute infarct in the right anterior thalamus.   Ventricle size normal. Patchy white matter hyperintensity bilaterally compatible with chronic microvascular ischemia. Chronic infarcts in the thalamus bilaterally. Chronic infarcts in the lateral basal ganglia bilaterally. Negative for hemorrhage or mass.   Image quality degraded by motion   Vascular: Normal arterial flow voids.   Skull and upper cervical spine: No focal skeletal lesion. C1-2 arthropathy with pannus.   Sinuses/Orbits: Paranasal sinuses clear. Bilateral cataract extraction   Other: None   IMPRESSION: Acute infarct in the right corona radiata. Small acute infarct in the right anterior thalamus  COGNITION: Overall cognitive status: Within functional limits for tasks assessed   SENSATION: WFL; pt notes having a history of the hands having some numbness/tingling  COORDINATION: Pt unable to do quick coordination tests in the L UE.  Pt able to tolerate movement, but unable to do the movements quickly.  POSTURE: No Significant postural limitations   LOWER EXTREMITY MMT:    MMT Right Eval Left Eval  Hip flexion 4 4-  Hip extension 4 4-  Hip abduction 4 4-  Hip adduction 4 4-  Knee flexion 4 4-  Knee extension 4 4-  Ankle dorsiflexion 4 4-  (Blank rows = not tested)  FUNCTIONAL TESTs:  5 times sit to stand: 11.13 sec Timed up and go (TUG): 11.33 sec 6 minute walk test: Perform at evaluation 10 meter walk test: 12.18 sec  PATIENT SURVEYS:  ABC scale 53.75% FOTO 58; Predicted 68  TODAY'S TREATMENT:    TherEx:  Seated LAQ, 2# AW donned, 2x15 Seated March with 2# AW donned, 2x15 Standing Heel Raises with UE Support and heels hanging off first  step, 2x15 Standing hip hikes at 4" step with use of airex pad off to side for tactile feedback, 2x15 each LE  Pt noting some difficulty with the L LE on the step due to pain in the L knee Lateral step overs at 4" step, x15 each direction   PATIENT EDUCATION: Education details: Pt educated on role of PT and services provided during current POC, along with prognosis and information about the clinic. Person educated: Patient and Spouse Education method: Explanation Education comprehension: verbalized understanding   HOME EXERCISE PROGRAM:  Access Code: [email or web address] URL: [email or web address] Date: 09/25/2022 Prepared by: [place]  Exercises - Standing Hip Abduction with Counter Support  - 1 x daily - 7 x weekly - 3 sets - 10 reps - Standing Hip Extension with Counter Support  - 1 x daily - 7 x weekly - 3 sets - 10 reps - Standing Knee Flexion with Counter Support  - 1 x daily - 7 x weekly - 3 sets - 10 reps - Standing March with Unilateral Counter Support  - 1 x daily - 7 x weekly - 3 sets - 10 reps - Heel Toe Raises with Unilateral Counter Support  - 1 x daily - 7 x weekly - 3 sets - 10 reps    GOALS: Goals reviewed with patient? Yes  SHORT TERM GOALS: Target date: 10/28/2022  Pt will be independent with HEP in order to demonstrate increased ability to perform tasks  related to occupation/hobbies. Baseline: No HEP given at eval Goal status: INITIAL  LONG TERM GOALS: Target date: 12/23/2022  1.  Patient (> 31 years old) will complete five times sit to stand test in < 10 seconds indicating an increased LE strength and improved balance. Baseline: 11.13 Goal status: INITIAL  2.  Patient will increase FOTO score to equal to or greater than  68   to demonstrate statistically significant improvement in mobility and quality of life.  Baseline: 58 Goal status: INITIAL   3.  Patient will increase ABC Balance scale by > 11% points to demonstrate decreased fall risk  during functional activities. Baseline: 53.75% Goal status: INITIAL   4.  Patient will reduce timed up and go to <11 seconds to reduce fall risk and demonstrate improved transfer/gait ability. Baseline: 11.33 sec Goal status: INITIAL  5. Patient will increase 10 meter walk test to >1.30ms as to improve gait speed for better community ambulation and to reduce fall risk. Baseline: 0.82 m/se Goal status: INITIAL  6.  Patient will increase six minute walk test distance to >1000 for progression to community ambulator and improve gait ability Baseline: 600 ft but ended at 4:34 seconds in. Goal status: INITIAL    ASSESSMENT:  CLINICAL IMPRESSION:  Pt very fatigued today due to have 2 prior therapy sessions before PT session and also an MD appointment this morning.  Pt educated on challenging herself daily, but not o the point of exhaustion.  Pt encouraged to continue with current HEP and to rest for the remainder of today.  Pt obliged and voiced understanding of instructions.   Pt will continue to benefit from skilled therapy to address remaining deficits in order to improve overall QoL and return to PLOF.      OBJECTIVE IMPAIRMENTS Abnormal gait, decreased activity tolerance, decreased balance, decreased endurance, decreased mobility, difficulty walking, decreased strength, and pain.   ACTIVITY LIMITATIONS carrying, lifting, bending, squatting, stairs, transfers, dressing, reach over head, and hygiene/grooming  PARTICIPATION LIMITATIONS: cleaning, laundry, shopping, community activity, and yard work  PERSONAL FACTORS Age, Fitness, Past/current experiences, and needing L TKA  are also affecting patient's functional outcome.   REHAB POTENTIAL: Good  CLINICAL DECISION MAKING: Stable/uncomplicated  EVALUATION COMPLEXITY: Low  PLAN: PT FREQUENCY: 2x/week  PT DURATION: 12 weeks  PLANNED INTERVENTIONS: Therapeutic exercises, Therapeutic activity, Neuromuscular re-education, Balance  training, Gait training, Patient/Family education, Self Care, Joint mobilization, DME instructions, Dry Needling, Moist heat, and Manual therapy  PLAN FOR NEXT SESSION: Endurance and perform strengthening exercises necessary for improved balance.    JGwenlyn Saran PT, DPT Physical Therapist- [facility] 09/30/22, 5:12 PM

## 2022-10-02 ENCOUNTER — Ambulatory Visit: Payer: Medicare HMO | Admitting: Speech Pathology

## 2022-10-02 ENCOUNTER — Ambulatory Visit: Payer: Medicare HMO

## 2022-10-02 ENCOUNTER — Ambulatory Visit: Payer: Medicare HMO | Admitting: Occupational Therapy

## 2022-10-02 DIAGNOSIS — R262 Difficulty in walking, not elsewhere classified: Secondary | ICD-10-CM | POA: Diagnosis not present

## 2022-10-02 DIAGNOSIS — M6281 Muscle weakness (generalized): Secondary | ICD-10-CM | POA: Diagnosis not present

## 2022-10-02 DIAGNOSIS — I6381 Other cerebral infarction due to occlusion or stenosis of small artery: Secondary | ICD-10-CM | POA: Diagnosis not present

## 2022-10-02 DIAGNOSIS — R41841 Cognitive communication deficit: Secondary | ICD-10-CM

## 2022-10-02 DIAGNOSIS — R278 Other lack of coordination: Secondary | ICD-10-CM | POA: Diagnosis not present

## 2022-10-02 NOTE — Therapy (Signed)
OUTPATIENT PHYSICAL THERAPY NEURO TREATMENT   Patient Name: [name] MRN: [id number] DOB:12/14/1940, 81 y.o., female Today's Date: 10/02/2022   PCP: Idelle Crouch, MD REFERRING PROVIDER: Bary Leriche, PA-C   PT End of Session - 10/02/22 1518     Visit Number 4    Number of Visits 24    Date for PT Re-Evaluation 12/16/22    Authorization Type [employer] - Needs Authorization    PT Start Time 1515    PT Stop Time 1600    PT Time Calculation (min) 45 min    Equipment Utilized During Treatment Gait belt    Activity Tolerance Patient tolerated treatment well    Behavior During Therapy WFL for tasks assessed/performed              Past Medical History:  Diagnosis Date   Anemia    Arthritis    Asthma    uses inhaler just prior to surgery to avoid attack   Back pain    from previous injury   Complication of anesthesia    has woken  up during 2 different surgery   Depression    no current issue/treatment; situation   Gallstones    GERD (gastroesophageal reflux disease)    Hiatal hernia    patient does NOT have nerve/muscle disease   History of kidney stones    HLD (hyperlipidemia)    HTN (hypertension)    Hypothyroidism    Kidney stones    Knee pain    Non-diabetic pancreatic hormone dysfunction years   pt. 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   Pancreatitis    Pneumonia    Seizures (HCC)    caused by dye injected during a procedure   Shortness of breath    with exertion   Sinus problem    frequent infections/congestion   Stroke ([place]) 2021   reports having CVA in 2021 and having mini strokes before that   Thyroid disease    Past Surgical History:  Procedure Laterality Date   ABDOMINAL HYSTERECTOMY     APPENDECTOMY     CARPAL TUNNEL RELEASE  10+ years ago   bilateral   EYE SURGERY  3 yrs ago   bilateral cataracts   FOOT OSTEOTOMY  6 weeks ago   Left foot: great, 2nd & 3rd   FOOT OSTEOTOMY  5 years ago   Right great toe   HAND  SURGERY Bilateral 2011-most recent   multiple hand surgeries, 2 on left, 3 on right   KNEE ARTHROPLASTY Right 04/28/2022   Procedure: COMPUTER ASSISTED TOTAL KNEE ARTHROPLASTY;  Surgeon: Dereck Leep, MD;  Location: [facility];  Service: Orthopedics;  Laterality: Right;   LOOP RECORDER INSERTION N/A 05/16/2020   Procedure: LOOP RECORDER INSERTION;  Surgeon: Isaias Cowman, MD;  Location: [facility];  Service: Cardiovascular;  Laterality: N/A;   NASAL SINUS SURGERY  most recent 7-8 yrs ago   7 sinus surgeries    TRIGGER FINGER RELEASE  11/19/2011   Procedure: RELEASE TRIGGER FINGER/A-1 PULLEY;  Surgeon: Wynonia Sours, MD;  Location: [facility];  Service: Orthopedics;  Laterality: Right;  release a-1 pulley right index finger and cyst removal   WRIST GANGLION EXCISION  1980's   right   Patient Active Problem List   Diagnosis Date Noted   Fatigue 09/17/2022   Left hemiparesis ([place]) 09/17/2022   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   GERD (gastroesophageal reflux disease) 08/21/2022   Agitation 08/20/2022  Acute left-sided weakness 08/20/2022   Expressive aphasia    Stroke ([place]) 08/19/2022   Leukocytosis 08/19/2022   History of urticaria 04/28/2022   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   Primary osteoarthritis of left knee 02/24/2022   Primary osteoarthritis of right knee 02/24/2022   Lumbar spondylolysis 04/12/2020   History of CVA (cerebrovascular accident) 03/26/2020   Low back pain radiating to right lower extremity 03/21/2020   B12 deficiency 03/06/2020   Positive anti-CCP test 12/21/2019   Arthralgia 12/13/2019   Dermatitis 12/13/2019   Rheumatoid factor positive 12/13/2019   Essential hypertension 12/11/2018   Palpitations 12/11/2018   Acquired hypothyroidism 11/10/2018   Arthritis of knee 09/17/2016   Anxiety 11/22/2014   Asthma without status asthmaticus 11/22/2014   Benign neoplasm of colon, unspecified 11/22/2014   Environmental  allergies 11/22/2014   Hypertriglyceridemia 11/22/2014   Hypokalemia 11/22/2014   Personal history of disease of skin and subcutaneous tissue 11/22/2014    ONSET DATE: 08/18/22  REFERRING DIAG: I63.81 (ICD-10-CM) - 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  Pt accompanied by: self and significant other; [name].  PERTINENT HISTORY:  Patient is a 81 year old female who was diagnosed with a right thalamic CVA on 08/18/2022 with left-sided weakness.  Patient underwent inpatient rehabilitation for 2 weeks.  Patient was assessed and was scheduled for a knee replacement on 08/29/2022 however had to cancel it due to having had a CVA.  Patient had a recent fall 2 days after discharging from inpatient rehab. past medical history includes: Knee replacement, essential HTN, hypokalemia, leukocytosis, seizures, positive anti-- CCP test, anxiety disorder, mini strokes.  Patient had shingles with left eye nerve pain s/p 1 year ago.   PAIN:  Are you having pain? Yes, pt is having 7/10 pain in the L shoulder and also in the L knee.    PRECAUTIONS: None  WEIGHT BEARING RESTRICTIONS No  FALLS: Has patient fallen in last 6 months? Yes. Number of falls 3  LIVING ENVIRONMENT: Lives with: lives with their spouse Lives in: House/apartment Stairs: Yes: Internal: 12 steps; on left going up and External: 6 in the back steps; can reach both Has following equipment at home: Gilford Rile -  2 wheeled  PLOF: Independent  PATIENT GOALS Pt wants to get use of her shoulder and her legs  back and improve balance.  OBJECTIVE:   DIAGNOSTIC FINDINGS:   FINDINGS: Brain: Acute infarct in the right corona radiata. Small acute infarct in the right anterior thalamus.   Ventricle size normal. Patchy white matter hyperintensity bilaterally compatible with chronic microvascular ischemia. Chronic infarcts in the thalamus bilaterally. Chronic infarcts in the lateral basal ganglia bilaterally. Negative for hemorrhage or mass.   Image quality degraded by motion   Vascular: Normal arterial flow voids.   Skull and upper cervical spine: No focal skeletal lesion. C1-2 arthropathy with pannus.   Sinuses/Orbits: Paranasal sinuses clear. Bilateral cataract extraction   Other: None   IMPRESSION: Acute infarct in the right corona radiata. Small acute infarct in the right anterior thalamus  COGNITION: Overall cognitive status: Within functional limits for tasks assessed   SENSATION: WFL; pt notes having a history of the hands having some numbness/tingling  COORDINATION: Pt unable to do quick coordination tests in the L UE.  Pt able to tolerate movement, but unable to do the movements quickly.  POSTURE: No Significant postural limitations   LOWER EXTREMITY MMT:    MMT Right Eval Left Eval  Hip flexion 4 4-  Hip extension 4 4-  Hip abduction 4 4-  Hip adduction 4 4-  Knee flexion 4 4-  Knee extension 4 4-  Ankle dorsiflexion 4 4-  (Blank rows = not tested)  FUNCTIONAL TESTs:  5 times sit to stand: 11.13 sec Timed up and go (TUG): 11.33 sec 6 minute walk test: Perform at evaluation 10 meter walk test: 12.18 sec  PATIENT SURVEYS:  ABC scale 53.75% FOTO 58; Predicted 68  TODAY'S TREATMENT:   TherEx:  Seated hamstring curl with BTB, 2x15 each LE Seated hip abduction with BTB, 2x15 each LE  Seated LAQ, 2# AW donned, 2x15 Seated March with BTB around distal thigh, 2x15 each LE  Standing on BOSU ball: Lateral weight shifts, 2x15 each side Forward/Backward  weight shifts, 2x15 Mini squats, x15  Standing Heel Raises with UE Support and heels hanging off first step, 2x15 Lateral step overs at 4" step, x15 each direction, minimal UE use Forward step ups at 4" step, x15 each LE leading     PATIENT EDUCATION: Education details: Pt educated on role of PT and services provided during current POC, along with prognosis and information about the clinic. Person educated: Patient and Spouse Education method: Explanation Education comprehension: verbalized understanding   HOME EXERCISE PROGRAM:  Access Code: [email or web address] URL: [email or web address] Date: 09/25/2022 Prepared by: [place] Nation  Exercises - Standing Hip Abduction with Counter Support  - 1 x daily - 7 x weekly - 3 sets - 10 reps - Standing Hip Extension with Counter Support  - 1 x daily - 7 x weekly - 3 sets - 10 reps - Standing Knee Flexion with Counter Support  - 1 x daily - 7 x weekly - 3 sets - 10 reps - Standing March with Unilateral Counter Support  - 1 x daily - 7 x weekly - 3 sets - 10 reps - Heel Toe Raises with Unilateral Counter Support  - 1 x daily - 7 x weekly - 3 sets - 10 reps    GOALS: Goals reviewed with patient? Yes  SHORT TERM GOALS: Target date: 10/30/2022  Pt will be independent with HEP in order to demonstrate increased ability to perform tasks related to occupation/hobbies. Baseline: No HEP  given at eval Goal status: INITIAL  LONG TERM GOALS: Target date: 12/25/2022  1.  Patient (> 55 years old) will complete five times sit to stand test in < 10 seconds indicating an increased LE strength and improved balance. Baseline: 11.13 Goal status: INITIAL  2.  Patient will increase FOTO score to equal to or greater than  68   to demonstrate statistically significant improvement in mobility and quality of life.  Baseline: 58 Goal status: INITIAL   3.  Patient will increase ABC Balance scale by > 11% points to demonstrate decreased fall risk during  functional activities. Baseline: 53.75% Goal status: INITIAL   4.  Patient will reduce timed up and go to <11 seconds to reduce fall risk and demonstrate improved transfer/gait ability. Baseline: 11.33 sec Goal status: INITIAL  5. Patient will increase 10 meter walk test to >1.26ms as to improve gait speed for better community ambulation and to reduce fall risk. Baseline: 0.82 m/se Goal status: INITIAL  6.  Patient will increase six minute walk test distance to >1000 for progression to community ambulator and improve gait ability Baseline: 600 ft but ended at 4:34 seconds in. Goal status: INITIAL    ASSESSMENT:  CLINICAL IMPRESSION:  Pt performed well with exercises and is making good progress with functional mobility and tasks.  Pt is gaining a better understanding of her limitations as therapy progresses and she is able to voice when she is becoming too fatigued to continue with exercises during therapy sessions.  Pt was able to tolerate increased seated exercises with resistance without nay complaints.  Pt to continue with LE strengthening exercises in order to promote greater stability during ambulation and more functional tasks.      OBJECTIVE IMPAIRMENTS Abnormal gait, decreased activity tolerance, decreased balance, decreased endurance, decreased mobility, difficulty walking, decreased strength, and pain.   ACTIVITY LIMITATIONS carrying, lifting, bending, squatting, stairs, transfers, dressing, reach over head, and hygiene/grooming  PARTICIPATION LIMITATIONS: cleaning, laundry, shopping, community activity, and yard work  PERSONAL FACTORS Age, Fitness, Past/current experiences, and needing L TKA  are also affecting patient's functional outcome.   REHAB POTENTIAL: Good  CLINICAL DECISION MAKING: Stable/uncomplicated  EVALUATION COMPLEXITY: Low  PLAN: PT FREQUENCY: 2x/week  PT DURATION: 12 weeks  PLANNED INTERVENTIONS: Therapeutic exercises, Therapeutic activity,  Neuromuscular re-education, Balance training, Gait training, Patient/Family education, Self Care, Joint mobilization, DME instructions, Dry Needling, Moist heat, and Manual therapy  PLAN FOR NEXT SESSION: Endurance and perform strengthening exercises necessary for improved balance.    JGwenlyn Saran PT, DPT Physical Therapist- [facility] 10/02/22, 4:43 PM

## 2022-10-02 NOTE — Therapy (Addendum)
OUTPATIENT OCCUPATIONAL THERAPY NEURO TREATMENT NOTE  Patient Name: [name] MRN: [id number] DOB:[birth year], 81 y.o., female Today's Date: 10/02/2022  PCP: Dr. Sylvester Castillo PROVIDER: Reesa Chew   OT End of Session - 10/02/22 1647     Visit Number 7    Number of Visits 36    Date for OT Re-Evaluation 12/02/22    Authorization Time Period Progress report period starting 09/09/2022    OT Start Time 1607    OT Stop Time 1645    OT Time Calculation (min) 38 min    Activity Tolerance Patient limited by pain;Patient tolerated treatment well    Behavior During Therapy [facility] for tasks assessed/performed             Past Medical History:  Diagnosis Date   Anemia    Arthritis    Asthma    uses inhaler just prior to surgery to avoid attack   Back pain    from previous injury   Complication of anesthesia    has woken  up during 2 different surgery   Depression    no current issue/treatment; situation   Gallstones    GERD (gastroesophageal reflux disease)    Hiatal hernia    patient does NOT have nerve/muscle disease   History of kidney stones    HLD (hyperlipidemia)    HTN (hypertension)    Hypothyroidism    Kidney stones    Knee pain    Non-diabetic pancreatic hormone dysfunction years   pt. 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   Pancreatitis    Pneumonia    Seizures (HCC)    caused by dye injected during a procedure   Shortness of breath    with exertion   Sinus problem    frequent infections/congestion   Stroke ([place]) 2021   reports having CVA in 2021 and having mini strokes before that   Thyroid disease    Past Surgical History:  Procedure Laterality Date   ABDOMINAL HYSTERECTOMY     APPENDECTOMY     CARPAL TUNNEL RELEASE  10+ years ago   bilateral   EYE SURGERY  3 yrs ago   bilateral cataracts   FOOT OSTEOTOMY  6 weeks ago   Left foot: great, 2nd & 3rd   FOOT OSTEOTOMY  5 years ago   Right great toe   HAND SURGERY Bilateral 2011-most  recent   multiple hand surgeries, 2 on left, 3 on right   KNEE ARTHROPLASTY Right 04/28/2022   Procedure: COMPUTER ASSISTED TOTAL KNEE ARTHROPLASTY;  Surgeon: Dereck Leep, MD;  Location: [facility];  Service: Orthopedics;  Laterality: Right;   LOOP RECORDER INSERTION N/A 05/16/2020   Procedure: LOOP RECORDER INSERTION;  Surgeon: Isaias Cowman, MD;  Location: [place] [facility];  Service: Cardiovascular;  Laterality: N/A;   NASAL SINUS SURGERY  most recent 7-8 yrs ago   7 sinus surgeries    TRIGGER FINGER RELEASE  11/19/2011   Procedure: RELEASE TRIGGER FINGER/A-1 PULLEY;  Surgeon: Wynonia Sours, MD;  Location: [place];  Service: Orthopedics;  Laterality: Right;  release a-1 pulley right index finger and cyst removal   WRIST GANGLION EXCISION  1980's   right   Patient Active Problem List   Diagnosis Date Noted   Fatigue 09/17/2022   Left hemiparesis ([place]) 09/17/2022   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   GERD (gastroesophageal reflux disease) 08/21/2022   Agitation 08/20/2022   Acute left-sided weakness 08/20/2022   Expressive aphasia  Stroke ([place]) 08/19/2022   Leukocytosis 08/19/2022   History of urticaria 04/28/2022   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   Primary osteoarthritis of left knee 02/24/2022   Primary osteoarthritis of right knee 02/24/2022   Lumbar spondylolysis 04/12/2020   History of CVA (cerebrovascular accident) 03/26/2020   Low back pain radiating to right lower extremity 03/21/2020   B12 deficiency 03/06/2020   Positive anti-CCP test 12/21/2019   Arthralgia 12/13/2019   Dermatitis 12/13/2019   Rheumatoid factor positive 12/13/2019   Essential hypertension 12/11/2018   Palpitations 12/11/2018   Acquired hypothyroidism 11/10/2018   Arthritis of knee 09/17/2016   Anxiety 11/22/2014   Asthma without status asthmaticus 11/22/2014   Benign neoplasm of colon, unspecified 11/22/2014   Environmental allergies 11/22/2014    Hypertriglyceridemia 11/22/2014   Hypokalemia 11/22/2014   Personal history of disease of skin and subcutaneous tissue 11/22/2014    ONSET DATE: 08/18/2022  REFERRING DIAG: CVA  THERAPY DIAG:  Muscle weakness (generalized)  Rationale for Evaluation and Treatment Rehabilitation  SUBJECTIVE:   SUBJECTIVE STATEMENT:   Pt. Reports trying to use her left hand to engage in ADLs, and IADL tasks.   Pt accompanied by: significant other  PERTINENT HISTORY: Patient is a 81 year old female who was diagnosed with a right thalamic CVA on 08/18/2022 with left-sided weakness.  Patient underwent inpatient rehabilitation for 2 weeks. Patient was assessed and was scheduled for a knee replacement on 08/29/2022 however had to cancel it due to having had a CVA.  Patient had a recent fall 2 days after discharging from inpatient rehab. past medical history includes: Knee replacement, essential HTN, hypokalemia, leukocytosis, seizures, positive anti-- CCP test, anxiety disorder, mini strokes.  Patient had shingles with left eye nerve pain s/p 1 year ago.   PRECAUTIONS: Fall  WEIGHT BEARING RESTRICTIONS No  PAIN:  Are you having pain? Yes, 5/10 left shoulder. Pt. Reports chronic left shoulder pain due to decreased cartilage in the joint space. Pt. Reports that it started when she was in her 89's.  FALLS: Has patient fallen in last 6 months? Yes. Number of falls 1  LIVING ENVIRONMENT: Lives with: Lives with Spouse Lives in: House/apartment Stairs: 2 storey home, resides on the first floor.  External: 2 stairs front no rails, and 6 in back with rails Has following equipment at home: Single point cane, Walker - 2 wheeled, Environmental consultant - 4 wheeled, Shower bench, and bed side commode  PLOF: Independent  PATIENT GOALS  Pt. Regain the use of her left arm  OBJECTIVE:   HAND DOMINANCE: Right  ADLs: Overall ADLs: Husband assists pt. as needed Transfers/ambulation related to ADLs:Pt. Has assistive  devices. Eating: Pt. Is independent with the right hand. Pt. Is unable to cut food, and open packets Grooming: Pt. Is using her right hand, however has difficulty using her left to assist with haircare UB Dressing: Pt. Is independent donning a pullover shirt, and button down shirt. Has difficulty with buttoning, LB Dressing:  Independent donning pants, and socks. Difficulty tying shoes. Toileting: Independent Bathing: independent using the right hand, difficulty using the LUE for the right side. Tub Shower transfers: Supervision Equipment: See above for equipment   IADLs: Shopping:  Has not had the opportunity for grocery shopping yet Light housekeeping: Husband assisting with light house keeping Meal Prep:  Dependent Community mobility: Relies of family/friends Medication management: Husband Oncologist: TBD Handwriting: 75% legible  MOBILITY STATUS: Hx of falls  POSTURE COMMENTS:  No Significant postural limitations Sitting balance: supported sitting  balance WFL  ACTIVITY TOLERANCE: Activity tolerance:  Fatigues with 10-15 min.  FUNCTIONAL OUTCOME MEASURES: FOTO: 45  UPPER EXTREMITY ROM     Active ROM Right Eval: WFL Left eval  Shoulder flexion  55(117)  Shoulder abduction  67(108  Shoulder adduction    Shoulder extension    Shoulder internal rotation    Shoulder external rotation    Elbow flexion  122(144)  Elbow extension  -16(0)  Wrist flexion  62  Wrist extension  -22(50)  Wrist ulnar deviation    Wrist radial deviation    Wrist pronation    Wrist supination    (Blank rows = not tested)  Left digit flexion to [facility]: 2nd: 2cm, 3rd: 0cm, 4th: 0cm, 5th: 0cm   Limited Left full digit extension   UPPER EXTREMITY MMT:     MMT Right Eval: 4+/5 overall Left eval  Shoulder flexion  3-/5  Shoulder abduction  3-/5  Shoulder adduction    Shoulder extension    Shoulder internal rotation    Shoulder external rotation    Middle trapezius     Lower trapezius    Elbow flexion  3+/5  Elbow extension  3+/5  Wrist flexion    Wrist extension  2-/5  Wrist ulnar deviation    Wrist radial deviation    Wrist pronation    Wrist supination    (Blank rows = not tested)  HAND FUNCTION: Left Unable  COORDINATION:  Impaired  SENSATION: Light touch: WFL, proprioceptive awareness: Intact  EDEMA: N/A  MUSCLE TONE: LUE: Hypotonic  COGNITION: Overall cognitive status: Within functional limits for tasks assessed Pt. Reports memory limitations.  Patient has bilateral hearing aids however was not wearing them during the initial evaluation.  VISION: Subjective report: Pt. report having shingles affecting left eye  s/p 1 year. Has nerve pain Baseline vision: Wears glasses for reading only Visual history: Report no changes  VISION ASSESSMENT:   WFL for tasks performed  PERCEPTION: Intact  PRAXIS: Impaired: Motor planning  OBSERVATIONS: Patient reports being sore following a recent fall after discharging from inpatient rehab.   TODAY'S TREATMENT:     There. Ex.:  Patient tolerated AROM in all joint ranges of the left upper extremity in supine. Patient worked on stabilizing her shoulder with perturbations, as well as protraction without a weight, followed by reps with a 1# wrist cuff weight In place. Pt. worked on bilateral shoulder flexion, and chest press exercises with with a 2# weight. Pt. worked on reaching, and placing cones diagonally across midline in supine with 1# wrist cuff weight in place.  Neuromuscular re-education:  Pt. Worked on opening container tops with the left hand, while stabilizing with the right hand. Pt. worked on left hand [facility] skills sliding coins to the edge of an elevated surface to the thumb in preparation for grasping the coins, and stacking them. Pt. Started with quarters, and moved to nickels, and pennies. Pt. stacked each group of coins on Dycem for stabilization.    Pt. reports that she has  been engaging her left hand during  many more tasks consistently at home. Pt. reports feeling better today than she did on Tuesday. Pt. was able to tolerate the UE exercises in supine, however presents with limited Left shoulder ROM in sitting with increased compensation proximally with hiking in the shoulder and leaning to the right with the trunk. Pt. Required consistent cues for avoiding compensation proximally while opening the containers.  Pt. Was able to grasp quarters, and nickels,  however had more difficulty grasping, and stacking the pennies. Patient continues to work on improving left upper extremity strength, wrist and digit extension, and overall  LUE functioning in order to improve engagement in, and maximize independence in ADLs and IADL tasks.    PATIENT EDUCATION: Education details: OT services, POC, and goals. Person educated: Patient and Spouse Education method: Customer service manager Education comprehension: verbalized understanding, returned demonstration, and needs further education   HOME EXERCISE PROGRAM:  Assess ongoing need for HEP    GOALS: Goals reviewed with patient? Yes  SHORT TERM GOALS: Target date: 10/21/2022      Patient will be independent with home exercise program for the left upper extremity Baseline: No current home exercise program Goal status: INITIAL  2.  Patient will independently formulate a full composite fist with the left hand.  Baseline: Second digit to the [facility]: 2cm.  Goal status: INITIAL              3: Pt. will improve active digit extension to be able to place her hand flat tabletop surface in preparation for weightbearing proprioceptive input. Baseline: Is unable to actively full digit extension Goal status: INITIAL   LONG TERM GOALS: Target date: 12/02/2022  Patient will improve active left shoulder flexion by 20 degrees preparation for reaching for the refrigerator door. Baseline: Eval: Left shoulder flexion  55(117) Goal status: INITIAL  2.  Patient will improve left shoulder active abduction to be able to comb her hair Baseline: Eval: Left shoulder abduction is 67(108) Goal status: INITIAL  3.  Patient will patiently button her shirt with modified independence. Baseline: Eval: Patient requires increased time to complete Goal status: INITIAL  4.  Patient well improve left grip strength preparation for cutting food Baseline: Eval:  Goal status: INITIAL  5.  Pt. will independently recall adaptive  strategies for performing ADL tasks including: flossing teeth, donning bra, applying makeup. Baseline: Eval: Pt. unable to perform Goal status: INITIAL  6.  Pt. will improve Foto score by 5 points to reflect patient perceived performance improvement assessment specific ADLs  and IADLs Baseline: Eval: 45 Goal status: INITIAL    ASSESSMENT:  CLINICAL IMPRESSION:   Pt. reports that she has been engaging her left hand during  many more tasks consistently at home. Pt. reports feeling better today than she did on Tuesday. Pt. was able to tolerate the UE exercises in supine, however presents with limited Left shoulder ROM in sitting with increased compensation proximally with hiking in the shoulder and leaning to the right with the trunk. Pt. Required consistent cues for avoiding compensation proximally while opening the containers.  Pt. Was able to grasp quarters, and nickels, however had more difficulty grasping, and stacking the pennies. Patient continues to work on improving left upper extremity strength, wrist and digit extension, and overall  LUE functioning in order to improve engagement in, and maximize independence in ADLs and IADL tasks.     PERFORMANCE DEFICITS in functional skills including ADLs, IADLs, coordination, proprioception, ROM, strength, [place], and [facility], cognitive skills including memory, and psychosocial skills including coping strategies, environmental adaptation, interpersonal  interactions, and routines and behaviors.   IMPAIRMENTS are limiting patient from ADLs, IADLs, education, leisure, and social participation.   COMORBIDITIES may have co-morbidities  that affects occupational performance. Patient will benefit from skilled OT to address above impairments and improve overall function.  MODIFICATION OR ASSISTANCE TO COMPLETE EVALUATION: Min-Moderate modification of tasks or assist with assess necessary to complete an evaluation.  OT OCCUPATIONAL PROFILE AND HISTORY: Detailed assessment: Review of records and additional review of physical, cognitive, psychosocial history related to current functional performance.  CLINICAL DECISION MAKING: Moderate - several treatment options, min-mod task modification necessary  REHAB POTENTIAL: Good  EVALUATION COMPLEXITY: Moderate    PLAN: OT FREQUENCY: 3x/week  OT DURATION: 12 weeks  PLANNED INTERVENTIONS: self care/ADL training, therapeutic exercise, therapeutic activity, neuromuscular re-education, manual therapy, passive range of motion, functional mobility training, electrical stimulation, and paraffin  RECOMMENDED OTHER SERVICES: PT  CONSULTED AND AGREED WITH PLAN OF CARE: Patient and family member/caregiver  PLAN FOR NEXT SESSION: Initiate OT treatment  Harrel Carina, MS, OTR/L   Harrel Carina, OT 10/02/2022, 4:51 PM

## 2022-10-03 NOTE — Therapy (Signed)
OUTPATIENT SPEECH LANGUAGE PATHOLOGY TREATMENT NOTE   Patient Name: [name] MRN: [id number] DOB:22-Nov-1941, 81 y.o., female Today's Date: 10/03/2022  PCP: Fulton Reek, MD REFERRING PROVIDER: Reesa Chew, PA  END OF SESSION:   End of Session - 10/03/22 0829     Visit Number 4    Number of Visits 17    Date for SLP Re-Evaluation 11/18/22    Authorization Type [employer] HMO    Progress Note Due on Visit 10    SLP Start Time 14    SLP Stop Time  1500    SLP Time Calculation (min) 60 min    Activity Tolerance Patient tolerated treatment well             Past Medical History:  Diagnosis Date   Anemia    Arthritis    Asthma    uses inhaler just prior to surgery to avoid attack   Back pain    from previous injury   Complication of anesthesia    has woken  up during 2 different surgery   Depression    no current issue/treatment; situation   Gallstones    GERD (gastroesophageal reflux disease)    Hiatal hernia    patient does NOT have nerve/muscle disease   History of kidney stones    HLD (hyperlipidemia)    HTN (hypertension)    Hypothyroidism    Kidney stones    Knee pain    Non-diabetic pancreatic hormone dysfunction years   pt. 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   Pancreatitis    Pneumonia    Seizures (HCC)    caused by dye injected during a procedure   Shortness of breath    with exertion   Sinus problem    frequent infections/congestion   Stroke ([place]) 2021   reports having CVA in 2021 and having mini strokes before that   Thyroid disease    Past Surgical History:  Procedure Laterality Date   ABDOMINAL HYSTERECTOMY     APPENDECTOMY     CARPAL TUNNEL RELEASE  10+ years ago   bilateral   EYE SURGERY  3 yrs ago   bilateral cataracts   FOOT OSTEOTOMY  6 weeks ago   Left foot: great, 2nd & 3rd   FOOT OSTEOTOMY  5 years ago   Right great toe   HAND SURGERY Bilateral 2011-most recent   multiple hand surgeries, 2 on left, 3 on  right   KNEE ARTHROPLASTY Right 04/28/2022   Procedure: COMPUTER ASSISTED TOTAL KNEE ARTHROPLASTY;  Surgeon: Dereck Leep, MD;  Location: [facility];  Service: Orthopedics;  Laterality: Right;   LOOP RECORDER INSERTION N/A 05/16/2020   Procedure: LOOP RECORDER INSERTION;  Surgeon: Isaias Cowman, MD;  Location: [place] [facility];  Service: Cardiovascular;  Laterality: N/A;   NASAL SINUS SURGERY  most recent 7-8 yrs ago   7 sinus surgeries    TRIGGER FINGER RELEASE  11/19/2011   Procedure: RELEASE TRIGGER FINGER/A-1 PULLEY;  Surgeon: Wynonia Sours, MD;  Location: [place];  Service: Orthopedics;  Laterality: Right;  release a-1 pulley right index finger and cyst removal   WRIST GANGLION EXCISION  1980's   right   Patient Active Problem List   Diagnosis Date Noted   Fatigue 09/17/2022   Left hemiparesis ([place]) 09/17/2022   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   GERD (gastroesophageal reflux disease) 08/21/2022   Agitation 08/20/2022   Acute left-sided weakness 08/20/2022   Expressive aphasia  Stroke ([place]) 08/19/2022   Leukocytosis 08/19/2022   History of urticaria 04/28/2022   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   Primary osteoarthritis of left knee 02/24/2022   Primary osteoarthritis of right knee 02/24/2022   Lumbar spondylolysis 04/12/2020   History of CVA (cerebrovascular accident) 03/26/2020   Low back pain radiating to right lower extremity 03/21/2020   B12 deficiency 03/06/2020   Positive anti-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 12/21/2019   Arthralgia 12/13/2019   Dermatitis 12/13/2019   Rheumatoid factor positive 12/13/2019   Essential hypertension 12/11/2018   Palpitations 12/11/2018   Acquired hypothyroidism 11/10/2018   Arthritis of knee 09/17/2016   Anxiety 11/22/2014   Asthma without status asthmaticus 11/22/2014   Benign neoplasm of colon, unspecified 11/22/2014   Environmental allergies 11/22/2014   Hypertriglyceridemia 11/22/2014   Hypokalemia 11/22/2014    Personal history of disease of skin and subcutaneous tissue 11/22/2014    ONSET DATE: 08/19/2022  REFERRING DIAG: I63.81 (ICD-10-CM) - 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[place]   PERTINENT HISTORY: Patient is a 81 y.o. female with PMH: HTN,, hypothyroidism, anxiety, seizure, shingles. She presented to [facility] ED via [facility] on 08/19/22 secondary to acute left sided weakness, lethargy, facial droop and slurred speech. Pt completed Inpatient Rehab Level therapies on 09/05/2022.      DIAGNOSTIC FINDINGS: MRI 2021 Small subacute infarct within the right frontal white matter.    MRI 08/19/2022  Acute infarct in the right corona radiata. Small acute infarct in the right anterior thalamus   Chronic microvascular ischemic change in the white matter and thalamus bilaterally. Chronic infarcts in the basal ganglia bilaterally.    THERAPY DIAG:  Cognitive communication deficit  Right thalamic stroke [facility])  Rationale for Evaluation and Treatment Rehabilitation  SUBJECTIVE: "I am realizing that it is going to take longer than I expected to recover (speaking specifically to LUE impairments)"  Pt accompanied by: significant other  PAIN:  Are you having pain? No  PATIENT GOALS: to improve attention, complex problem solving  OBJECTIVE:   TODAY'S TREATMENT: Skilled treatment session focused on pt's cognition goals, specifically the use of meta-cognitive strategies for executive functioning to increase accuracy and safe task completion. SLP facilitated by providing the following interventions:  Pt brought in pad of paper that she is using to list all of her accomplishments throughout the last 2 days. Pt reports that she feels more positive about herself when seeing all that she has done. SLP collaborated with pt and her husband to also identify additional areas of success. Extensive education provided on functional progress vs return to baseline.    PATIENT EDUCATION: Education details: meta-cognitive  strategies; executive functions Person educated: Patient and Spouse Education method: Explanation, Demonstration, Verbal cues, and Handouts Education comprehension: verbalized understanding and needs further education  GOALS: Goals reviewed with patient? Yes  SHORT TERM GOALS: Target date: 10 sessions   With supervision level cues, pt will recall her medicines and their function with 90% accuracy across 5 sessions.  Baseline: unable to recall Goal status: INITIAL   2.  With supervision level cues, pt will use strategies to improve executive functioning for improved task completion.  Baseline: not using any strategies Goal status: INITIAL   3.  With supervision level cues, pt will demonstrate divided attention to complex problem solving task for 30 minutes in a moderately distracting environment.  Baseline: < 5 minutes Goal status: INITIAL   4.  Pt and her husband will report completion of HEP for 5 out of 7 opportunities.  Baseline: new goal Goal  status: INITIAL     LONG TERM GOALS: Target date: 11/18/2022 Pt with be independent with use of executive function strategies for functional independence within ADLs and IADLs.  Baseline: min to supervision level  Goal status: INITIAL     ASSESSMENT:  CLINICAL IMPRESSION: Pt presents with mild higher level cognitive deficits that are more apparent when completing functional tasks. She specifically needs instructing and training in strategies to improve executive functioning during everyday activities and activities such as baking to increase pt's quality of life.   OBJECTIVE IMPAIRMENTS include attention, memory, awareness, and executive functioning. These impairments are limiting patient from managing medications, managing appointments, managing finances, household responsibilities, and ADLs/IADLs. Factors affecting potential to achieve goals and functional outcome are co-morbidities. Patient will benefit from skilled SLP services to  address above impairments and improve overall function.  REHAB POTENTIAL: Excellent  PLAN: SLP FREQUENCY: 2x/week  SLP DURATION: 12 weeks  PLANNED INTERVENTIONS: Cognitive reorganization, Internal/external aids, Functional tasks, SLP instruction and feedback, Compensatory strategies, and Patient/family education  Gilmore List B. Rutherford Nail, M.S., CCC-SLP, Mining engineer Certified Brain Injury [place]  [place] Office [phone] Ascom [phone] Fax [phone]

## 2022-10-07 ENCOUNTER — Ambulatory Visit: Payer: Medicare HMO | Admitting: Speech Pathology

## 2022-10-07 ENCOUNTER — Ambulatory Visit: Payer: Medicare HMO

## 2022-10-07 ENCOUNTER — Ambulatory Visit: Payer: Medicare HMO | Admitting: Occupational Therapy

## 2022-10-07 ENCOUNTER — Encounter: Payer: Self-pay | Admitting: Occupational Therapy

## 2022-10-07 DIAGNOSIS — M6281 Muscle weakness (generalized): Secondary | ICD-10-CM

## 2022-10-07 DIAGNOSIS — R262 Difficulty in walking, not elsewhere classified: Secondary | ICD-10-CM

## 2022-10-07 DIAGNOSIS — R41841 Cognitive communication deficit: Secondary | ICD-10-CM | POA: Diagnosis not present

## 2022-10-07 DIAGNOSIS — I6381 Other cerebral infarction due to occlusion or stenosis of small artery: Secondary | ICD-10-CM

## 2022-10-07 DIAGNOSIS — R278 Other lack of coordination: Secondary | ICD-10-CM | POA: Diagnosis not present

## 2022-10-07 NOTE — Therapy (Signed)
Therapist consulted pt during meeting with SLP to provide education about therapy process and rationale behind treatment approaches at this time.  Pt noting that she was experiencing significant amounts of pain over the weekend following the therapy session and pt as ell as spouse, wanted clarification as to why therapist was addressing bilateral LE's rather than focusing only on her L LE.  Therapist expressed reasoning behind this including but not limited to generalized weakness present at initial evaluation in B LE's, the research and behind Neuroplasticity and benefits of contralateral involvement from affected side, and the benefits of strengthening B LE for proper ambulation and improved balance.  All concerns addressed at this time with pt requesting to continue physical therapy at the next appointed visit.  Pt advised to continue with communicating with therapist when exercises may feel too difficult and explain the reasoning so that therapist may intervene and suggest alternative measures.   Pt will continue to benefit from skilled therapy to address remaining deficits in order to improve overall QoL and return to PLOF.     Pt not billed for education provided to pt and spouse at this time.  Gwenlyn Saran, PT, DPT Physical Therapist- [facility]  10/07/22, 10:50 PM

## 2022-10-07 NOTE — Therapy (Addendum)
OUTPATIENT SPEECH LANGUAGE PATHOLOGY TREATMENT NOTE   Patient Name: [name] MRN: [id number] DOB:1941/10/21, 81 y.o., female Today's Date: 10/07/2022  PCP: Fulton Reek, MD REFERRING PROVIDER: Reesa Chew, PA  END OF SESSION:   End of Session - 10/07/22 1719     Visit Number 5    Number of Visits 17    Date for SLP Re-Evaluation 11/18/22    Authorization Type [employer] HMO    Progress Note Due on Visit 10    SLP Start Time 1500    SLP Stop Time  1645    SLP Time Calculation (min) 105 min    Activity Tolerance Patient tolerated treatment well             Past Medical History:  Diagnosis Date   Anemia    Arthritis    Asthma    uses inhaler just prior to surgery to avoid attack   Back pain    from previous injury   Complication of anesthesia    has woken  up during 2 different surgery   Depression    no current issue/treatment; situation   Gallstones    GERD (gastroesophageal reflux disease)    Hiatal hernia    patient does NOT have nerve/muscle disease   History of kidney stones    HLD (hyperlipidemia)    HTN (hypertension)    Hypothyroidism    Kidney stones    Knee pain    Non-diabetic pancreatic hormone dysfunction years   pt. 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   Pancreatitis    Pneumonia    Seizures ([place])    caused by dye injected during a procedure   Shortness of breath    with exertion   Sinus problem    frequent infections/congestion   Stroke ([place]) 2021   reports having CVA in 2021 and having mini strokes before that   Thyroid disease    Past Surgical History:  Procedure Laterality Date   ABDOMINAL HYSTERECTOMY     APPENDECTOMY     CARPAL TUNNEL RELEASE  10+ years ago   bilateral   EYE SURGERY  3 yrs ago   bilateral cataracts   FOOT OSTEOTOMY  6 weeks ago   Left foot: great, 2nd & 3rd   FOOT OSTEOTOMY  5 years ago   Right great toe   HAND SURGERY Bilateral 2011-most recent   multiple hand surgeries, 2 on left, 3 on  right   KNEE ARTHROPLASTY Right 04/28/2022   Procedure: COMPUTER ASSISTED TOTAL KNEE ARTHROPLASTY;  Surgeon: Dereck Leep, MD;  Location: [facility];  Service: Orthopedics;  Laterality: Right;   LOOP RECORDER INSERTION N/A 05/16/2020   Procedure: LOOP RECORDER INSERTION;  Surgeon: Isaias Cowman, MD;  Location: [place] [facility];  Service: Cardiovascular;  Laterality: N/A;   NASAL SINUS SURGERY  most recent 7-8 yrs ago   7 sinus surgeries    TRIGGER FINGER RELEASE  11/19/2011   Procedure: RELEASE TRIGGER FINGER/A-1 PULLEY;  Surgeon: Wynonia Sours, MD;  Location: [place];  Service: Orthopedics;  Laterality: Right;  release a-1 pulley right index finger and cyst removal   WRIST GANGLION EXCISION  1980's   right   Patient Active Problem List   Diagnosis Date Noted   Fatigue 09/17/2022   Left hemiparesis ([place]) 09/17/2022   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   GERD (gastroesophageal reflux disease) 08/21/2022   Agitation 08/20/2022   Acute left-sided weakness 08/20/2022   Expressive aphasia  Stroke ([place]) 08/19/2022   Leukocytosis 08/19/2022   History of urticaria 04/28/2022   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   Primary osteoarthritis of left knee 02/24/2022   Primary osteoarthritis of right knee 02/24/2022   Lumbar spondylolysis 04/12/2020   History of CVA (cerebrovascular accident) 03/26/2020   Low back pain radiating to right lower extremity 03/21/2020   B12 deficiency 03/06/2020   Positive anti-CCP test 12/21/2019   Arthralgia 12/13/2019   Dermatitis 12/13/2019   Rheumatoid factor positive 12/13/2019   Essential hypertension 12/11/2018   Palpitations 12/11/2018   Acquired hypothyroidism 11/10/2018   Arthritis of knee 09/17/2016   Anxiety 11/22/2014   Asthma without status asthmaticus 11/22/2014   Benign neoplasm of colon, unspecified 11/22/2014   Environmental allergies 11/22/2014   Hypertriglyceridemia 11/22/2014   Hypokalemia 11/22/2014    Personal history of disease of skin and subcutaneous tissue 11/22/2014    ONSET DATE: 08/19/2022  REFERRING DIAG: I63.81 (ICD-10-CM) - 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[place]   PERTINENT HISTORY: Patient is a 81 y.o. female with PMH: HTN,, hypothyroidism, anxiety, seizure, shingles. She presented to [facility] ED via [facility] on 08/19/22 secondary to acute left sided weakness, lethargy, facial droop and slurred speech. Pt completed Inpatient Rehab Level therapies on 09/05/2022.      DIAGNOSTIC FINDINGS: MRI 2021 Small subacute infarct within the right frontal white matter.    MRI 08/19/2022  Acute infarct in the right corona radiata. Small acute infarct in the right anterior thalamus   Chronic microvascular ischemic change in the white matter and thalamus bilaterally. Chronic infarcts in the basal ganglia bilaterally.    THERAPY DIAG:  Cognitive communication deficit  Right thalamic stroke [facility])  Rationale for Evaluation and Treatment Rehabilitation  SUBJECTIVE: "I was so sore this weekend"  Pt accompanied by: significant other  PAIN:  Are you having pain? No  PATIENT GOALS: to improve attention, complex problem solving  OBJECTIVE:   TODAY'S TREATMENT: Skilled treatment session focused on pt's cognition goals, specifically the use of meta-cognitive strategies for executive functioning to increase accuracy and safe task completion. SLP facilitated by providing the following interventions:  With moderate verbal support, pt able to organize thoughts when describing preferences and conveying abstract complex information. Pt's husband also demonstrates ability to facilitate cohesive presentation of thoughts.    PATIENT EDUCATION: Education details: meta-cognitive strategies; executive functions Person educated: Patient and Spouse Education method: Explanation, Demonstration, Verbal cues, and Handouts Education comprehension: verbalized understanding and needs further  education  GOALS: Goals reviewed with patient? Yes  SHORT TERM GOALS: Target date: 10 sessions   With supervision level cues, pt will recall her medicines and their function with 90% accuracy across 5 sessions.  Baseline: unable to recall Goal status: INITIAL   2.  With supervision level cues, pt will use strategies to improve executive functioning for improved task completion.  Baseline: not using any strategies Goal status: INITIAL   3.  With supervision level cues, pt will demonstrate divided attention to complex problem solving task for 30 minutes in a moderately distracting environment.  Baseline: < 5 minutes Goal status: INITIAL   4.  Pt and her husband will report completion of HEP for 5 out of 7 opportunities.  Baseline: new goal Goal status: INITIAL     LONG TERM GOALS: Target date: 11/18/2022 Pt with be independent with use of executive function strategies for functional independence within ADLs and IADLs.  Baseline: min to supervision level  Goal status: INITIAL     ASSESSMENT:  CLINICAL IMPRESSION:  Pt presents with mild higher level cognitive deficits that are more apparent when completing functional tasks. She specifically needs instructing and training in strategies to improve executive functioning during everyday activities and activities such as baking to increase pt's quality of life.   OBJECTIVE IMPAIRMENTS include attention, memory, awareness, and executive functioning. These impairments are limiting patient from managing medications, managing appointments, managing finances, household responsibilities, and ADLs/IADLs. Factors affecting potential to achieve goals and functional outcome are co-morbidities. Patient will benefit from skilled SLP services to address above impairments and improve overall function.  REHAB POTENTIAL: Excellent  PLAN: SLP FREQUENCY: 2x/week  SLP DURATION: 12 weeks  PLANNED INTERVENTIONS: Cognitive reorganization,  Internal/external aids, Functional tasks, SLP instruction and feedback, Compensatory strategies, and Patient/family education  Nesha Counihan B. Rutherford Nail, M.S., CCC-SLP, Mining engineer Certified Brain Injury [place]  [place] Office [phone] Ascom [phone] Fax [phone]

## 2022-10-07 NOTE — Therapy (Signed)
OUTPATIENT OCCUPATIONAL THERAPY NEURO TREATMENT NOTE  Patient Name: [name] MRN: [id number] DOB:1941-10-12, 81 y.o., female Today's Date: 10/07/2022  PCP: Dr. Sylvester Harder PROVIDER: Reesa Chew   OT End of Session - 10/07/22 1742     Visit Number 8    Number of Visits 36    Date for OT Re-Evaluation 12/02/22    Authorization Time Period Progress report period starting 09/09/2022    OT Start Time 1645    OT Stop Time 1730    OT Time Calculation (min) 45 min    Activity Tolerance Patient limited by pain;Patient tolerated treatment well    Behavior During Therapy [facility] for tasks assessed/performed             Past Medical History:  Diagnosis Date   Anemia    Arthritis    Asthma    uses inhaler just prior to surgery to avoid attack   Back pain    from previous injury   Complication of anesthesia    has woken  up during 2 different surgery   Depression    no current issue/treatment; situation   Gallstones    GERD (gastroesophageal reflux disease)    Hiatal hernia    patient does NOT have nerve/muscle disease   History of kidney stones    HLD (hyperlipidemia)    HTN (hypertension)    Hypothyroidism    Kidney stones    Knee pain    Non-diabetic pancreatic hormone dysfunction years   pt. 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   Pancreatitis    Pneumonia    Seizures (HCC)    caused by dye injected during a procedure   Shortness of breath    with exertion   Sinus problem    frequent infections/congestion   Stroke ([place]) 2021   reports having CVA in 2021 and having mini strokes before that   Thyroid disease    Past Surgical History:  Procedure Laterality Date   ABDOMINAL HYSTERECTOMY     APPENDECTOMY     CARPAL TUNNEL RELEASE  10+ years ago   bilateral   EYE SURGERY  3 yrs ago   bilateral cataracts   FOOT OSTEOTOMY  6 weeks ago   Left foot: great, 2nd & 3rd   FOOT OSTEOTOMY  5 years ago   Right great toe   HAND SURGERY Bilateral 2011-most  recent   multiple hand surgeries, 2 on left, 3 on right   KNEE ARTHROPLASTY Right 04/28/2022   Procedure: COMPUTER ASSISTED TOTAL KNEE ARTHROPLASTY;  Surgeon: Dereck Leep, MD;  Location: [facility];  Service: Orthopedics;  Laterality: Right;   LOOP RECORDER INSERTION N/A 05/16/2020   Procedure: LOOP RECORDER INSERTION;  Surgeon: Isaias Cowman, MD;  Location: [place] [facility];  Service: Cardiovascular;  Laterality: N/A;   NASAL SINUS SURGERY  most recent 7-8 yrs ago   7 sinus surgeries    TRIGGER FINGER RELEASE  11/19/2011   Procedure: RELEASE TRIGGER FINGER/A-1 PULLEY;  Surgeon: Wynonia Sours, MD;  Location: [place];  Service: Orthopedics;  Laterality: Right;  release a-1 pulley right index finger and cyst removal   WRIST GANGLION EXCISION  1980's   right   Patient Active Problem List   Diagnosis Date Noted   Fatigue 09/17/2022   Left hemiparesis ([place]) 09/17/2022   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   GERD (gastroesophageal reflux disease) 08/21/2022   Agitation 08/20/2022   Acute left-sided weakness 08/20/2022   Expressive aphasia  Stroke ([place]) 08/19/2022   Leukocytosis 08/19/2022   History of urticaria 04/28/2022   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   Primary osteoarthritis of left knee 02/24/2022   Primary osteoarthritis of right knee 02/24/2022   Lumbar spondylolysis 04/12/2020   History of CVA (cerebrovascular accident) 03/26/2020   Low back pain radiating to right lower extremity 03/21/2020   B12 deficiency 03/06/2020   Positive anti-CCP test 12/21/2019   Arthralgia 12/13/2019   Dermatitis 12/13/2019   Rheumatoid factor positive 12/13/2019   Essential hypertension 12/11/2018   Palpitations 12/11/2018   Acquired hypothyroidism 11/10/2018   Arthritis of knee 09/17/2016   Anxiety 11/22/2014   Asthma without status asthmaticus 11/22/2014   Benign neoplasm of colon, unspecified 11/22/2014   Environmental allergies 11/22/2014    Hypertriglyceridemia 11/22/2014   Hypokalemia 11/22/2014   Personal history of disease of skin and subcutaneous tissue 11/22/2014    ONSET DATE: 08/18/2022  REFERRING DIAG: CVA  THERAPY DIAG:  Muscle weakness (generalized)  Other lack of coordination  Rationale for Evaluation and Treatment Rehabilitation  SUBJECTIVE:   SUBJECTIVE STATEMENT:   Pt. Reports having had a fall on the floor of her car today, and landed on her left arm.   Pt accompanied by: significant other  PERTINENT HISTORY: Patient is a 81 year old female who was diagnosed with a right thalamic CVA on 08/18/2022 with left-sided weakness.  Patient underwent inpatient rehabilitation for 2 weeks. Patient was assessed and was scheduled for a knee replacement on 08/29/2022 however had to cancel it due to having had a CVA.  Patient had a recent fall 2 days after discharging from inpatient rehab. past medical history includes: Knee replacement, essential HTN, hypokalemia, leukocytosis, seizures, positive anti-- CCP test, anxiety disorder, mini strokes.  Patient had shingles with left eye nerve pain s/p 1 year ago.   PRECAUTIONS: Fall  WEIGHT BEARING RESTRICTIONS No  PAIN:  Are you having pain? Yes, 8/10 left shoulder. Pt. Had a fall in the car , and landed on her left shoulder.  Pt. Reports chronic left shoulder pain due to decreased cartilage in the joint space. Pt. Reports that it started when she was in her 19's.  FALLS: Has patient fallen in last 6 months? Yes. Number of falls 1  LIVING ENVIRONMENT: Lives with: Lives with Spouse Lives in: House/apartment Stairs: 2 storey home, resides on the first floor.  External: 2 stairs front no rails, and 6 in back with rails Has following equipment at home: Single point cane, Walker - 2 wheeled, Environmental consultant - 4 wheeled, Shower bench, and bed side commode  PLOF: Independent  PATIENT GOALS  Pt. Regain the use of her left arm  OBJECTIVE:   HAND DOMINANCE: Right  ADLs: Overall  ADLs: Husband assists pt. as needed Transfers/ambulation related to ADLs:Pt. Has assistive devices. Eating: Pt. Is independent with the right hand. Pt. Is unable to cut food, and open packets Grooming: Pt. Is using her right hand, however has difficulty using her left to assist with haircare UB Dressing: Pt. Is independent donning a pullover shirt, and button down shirt. Has difficulty with buttoning, LB Dressing:  Independent donning pants, and socks. Difficulty tying shoes. Toileting: Independent Bathing: independent using the right hand, difficulty using the LUE for the right side. Tub Shower transfers: Supervision Equipment: See above for equipment   IADLs: Shopping:  Has not had the opportunity for grocery shopping yet Light housekeeping: Husband assisting with light house keeping Meal Prep:  Dependent Community mobility: Relies of family/friends Medication management: Husband assisting  Financial management: TBD Handwriting: 75% legible  MOBILITY STATUS: Hx of falls  POSTURE COMMENTS:  No Significant postural limitations Sitting balance: supported sitting balance WFL  ACTIVITY TOLERANCE: Activity tolerance:  Fatigues with 10-15 min.  FUNCTIONAL OUTCOME MEASURES: FOTO: 45  UPPER EXTREMITY ROM     Active ROM Right Eval: WFL Left eval  Shoulder flexion  55(117)  Shoulder abduction  67(108  Shoulder adduction    Shoulder extension    Shoulder internal rotation    Shoulder external rotation    Elbow flexion  122(144)  Elbow extension  -16(0)  Wrist flexion  62  Wrist extension  -22(50)  Wrist ulnar deviation    Wrist radial deviation    Wrist pronation    Wrist supination    (Blank rows = not tested)  Left digit flexion to [facility]: 2nd: 2cm, 3rd: 0cm, 4th: 0cm, 5th: 0cm   Limited Left full digit extension   UPPER EXTREMITY MMT:     MMT Right Eval: 4+/5 overall Left eval  Shoulder flexion  3-/5  Shoulder abduction  3-/5  Shoulder adduction    Shoulder  extension    Shoulder internal rotation    Shoulder external rotation    Middle trapezius    Lower trapezius    Elbow flexion  3+/5  Elbow extension  3+/5  Wrist flexion    Wrist extension  2-/5  Wrist ulnar deviation    Wrist radial deviation    Wrist pronation    Wrist supination    (Blank rows = not tested)  HAND FUNCTION: Left Unable  COORDINATION:  Impaired  SENSATION: Light touch: WFL, proprioceptive awareness: Intact  EDEMA: N/A  MUSCLE TONE: LUE: Hypotonic  COGNITION: Overall cognitive status: Within functional limits for tasks assessed Pt. Reports memory limitations.  Patient has bilateral hearing aids however was not wearing them during the initial evaluation.  VISION: Subjective report: Pt. report having shingles affecting left eye  s/p 1 year. Has nerve pain Baseline vision: Wears glasses for reading only Visual history: Report no changes  VISION ASSESSMENT:   WFL for tasks performed  PERCEPTION: Intact  PRAXIS: Impaired: Motor planning  OBSERVATIONS: Patient reports being sore following a recent fall after discharging from inpatient rehab.   TODAY'S TREATMENT:     There. Ex.:  Pt. Performed reps of left forearm supination, wrist extension, digit extension, thumb abduction, and thumb IP extension. Pt. Worked  on digit, and thumb exercises with his hand flat at the tabletop surface. Pt. Worked on pinch strengthening in the left hand for lateral, and 3pt. pinch using yellow resistive clips.    Neuromuscular re-education:  Pt. worked  left hand [place] grasping 1", and 3/4" washers from a magnetic dish. Pt. Worked on sliding them up to the edge of the magnetic dish with her second digit to her thumb, and stacking them on [place] at the tabletop surface.   Pt. continues to report that she has been engaging her left hand during  many more tasks consistently at home.  Patient has been using her left hand to open the mayonnaise jar, holding her coffee mug, and  consistently engaging her left hand throughout the day.  Patient reports that she was not able to sit down and work with her hand over the weekend secondary to lower extremity pain.  Patient reports having had a fall in the car to the floor of the car on her left arm today.  Patient reports 8 out of 10 pain in her  left  shoulder. Pt. was able to hold digit extension with some resistance for the third fourth and fifth digits. Patient has improved with active thumb IP flexion.  Patient continues to present with increased compensation proximally with her left upper extremity, right upper extremity, trunk, and lower extremities when concentrating on fine motor coordination tasks.  Patient continues to work on improving left upper extremity strength, wrist and digit extension, and overall  LUE functioning in order to improve engagement in, and maximize independence in ADLs and IADL tasks.    PATIENT EDUCATION: Education details: OT services, POC, and goals. Person educated: Patient and Spouse Education method: Customer service manager Education comprehension: verbalized understanding, returned demonstration, and needs further education   HOME EXERCISE PROGRAM:  Assess ongoing need for HEP    GOALS: Goals reviewed with patient? Yes  SHORT TERM GOALS: Target date: 10/21/2022      Patient will be independent with home exercise program for the left upper extremity Baseline: No current home exercise program Goal status: INITIAL  2.  Patient will independently formulate a full composite fist with the left hand.  Baseline: Second digit to the [facility]: 2cm.  Goal status: INITIAL              3: Pt. will improve active digit extension to be able to place her hand flat tabletop surface in preparation for weightbearing proprioceptive input. Baseline: Is unable to actively full digit extension Goal status: INITIAL   LONG TERM GOALS: Target date: 12/02/2022  Patient will improve active left  shoulder flexion by 20 degrees preparation for reaching for the refrigerator door. Baseline: Eval: Left shoulder flexion 55(117) Goal status: INITIAL  2.  Patient will improve left shoulder active abduction to be able to comb her hair Baseline: Eval: Left shoulder abduction is 67(108) Goal status: INITIAL  3.  Patient will patiently button her shirt with modified independence. Baseline: Eval: Patient requires increased time to complete Goal status: INITIAL  4.  Patient well improve left grip strength preparation for cutting food Baseline: Eval:  Goal status: INITIAL  5.  Pt. will independently recall adaptive  strategies for performing ADL tasks including: flossing teeth, donning bra, applying makeup. Baseline: Eval: Pt. unable to perform Goal status: INITIAL  6.  Pt. will improve Foto score by 5 points to reflect patient perceived performance improvement assessment specific ADLs  and IADLs Baseline: Eval: 45 Goal status: INITIAL    ASSESSMENT:  CLINICAL IMPRESSION:   Pt. continues to report that she has been engaging her left hand during  many more tasks consistently at home.  Patient has been using her left hand to open the mayonnaise jar, holding her coffee mug, and consistently engaging her left hand throughout the day.  Patient reports that she was not able to sit down and work with her hand over the weekend secondary to lower extremity pain.  Patient reports having had a fall in the car to the floor of the car on her left arm today.  Patient reports 8 out of 10 pain in her  left shoulder. Pt. was able to hold digit extension with some resistance for the third fourth and fifth digits. Patient has improved with active thumb IP flexion.  Patient continues to present with increased compensation proximally with her left upper extremity, right upper extremity, trunk, and lower extremities when concentrating on fine motor coordination tasks.  Patient continues to work on improving left  upper extremity strength, wrist and digit extension, and overall  LUE functioning in  order to improve engagement in, and maximize independence in ADLs and IADL tasks.    PERFORMANCE DEFICITS in functional skills including ADLs, IADLs, coordination, proprioception, ROM, strength, Warrior, and GMC, cognitive skills including memory, and psychosocial skills including coping strategies, environmental adaptation, interpersonal interactions, and routines and behaviors.   IMPAIRMENTS are limiting patient from ADLs, IADLs, education, leisure, and social participation.   COMORBIDITIES may have co-morbidities  that affects occupational performance. Patient will benefit from skilled OT to address above impairments and improve overall function.  MODIFICATION OR ASSISTANCE TO COMPLETE EVALUATION: Min-Moderate modification of tasks or assist with assess necessary to complete an evaluation.  OT OCCUPATIONAL PROFILE AND HISTORY: Detailed assessment: Review of records and additional review of physical, cognitive, psychosocial history related to current functional performance.  CLINICAL DECISION MAKING: Moderate - several treatment options, min-mod task modification necessary  REHAB POTENTIAL: Good  EVALUATION COMPLEXITY: Moderate    PLAN: OT FREQUENCY: 3x/week  OT DURATION: 12 weeks  PLANNED INTERVENTIONS: self care/ADL training, therapeutic exercise, therapeutic activity, neuromuscular re-education, manual therapy, passive range of motion, functional mobility training, electrical stimulation, and paraffin  RECOMMENDED OTHER SERVICES: PT  CONSULTED AND AGREED WITH PLAN OF CARE: Patient and family member/caregiver  PLAN FOR NEXT SESSION: Initiate OT treatment  Harrel Carina, MS, OTR/L   Harrel Carina, OT 10/07/2022, 5:47 PM

## 2022-10-09 ENCOUNTER — Ambulatory Visit: Payer: Medicare HMO | Admitting: Occupational Therapy

## 2022-10-09 ENCOUNTER — Ambulatory Visit: Payer: Medicare HMO

## 2022-10-09 ENCOUNTER — Ambulatory Visit: Payer: Medicare HMO | Admitting: Speech Pathology

## 2022-10-09 NOTE — Therapy (Incomplete)
OUTPATIENT PHYSICAL THERAPY NEURO TREATMENT   Patient Name: [name] MRN: [id number] DOB:1940-12-19, 81 y.o., female Today's Date: 10/09/2022   PCP: Idelle Crouch, MD REFERRING PROVIDER: Flora Lipps      Past Medical History:  Diagnosis Date   Anemia    Arthritis    Asthma    uses inhaler just prior to surgery to avoid attack   Back pain    from previous injury   Complication of anesthesia    has woken  up during 2 different surgery   Depression    no current issue/treatment; situation   Gallstones    GERD (gastroesophageal reflux disease)    Hiatal hernia    patient does NOT have nerve/muscle disease   History of kidney stones    HLD (hyperlipidemia)    HTN (hypertension)    Hypothyroidism    Kidney stones    Knee pain    Non-diabetic pancreatic hormone dysfunction years   pt. 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   Pancreatitis    Pneumonia    Seizures ([place])    caused by dye injected during a procedure   Shortness of breath    with exertion   Sinus problem    frequent infections/congestion   Stroke ([place]) 2021   reports having CVA in 2021 and having mini strokes before that   Thyroid disease    Past Surgical History:  Procedure Laterality Date   ABDOMINAL HYSTERECTOMY     APPENDECTOMY     CARPAL TUNNEL RELEASE  10+ years ago   bilateral   EYE SURGERY  3 yrs ago   bilateral cataracts   FOOT OSTEOTOMY  6 weeks ago   Left foot: great, 2nd & 3rd   FOOT OSTEOTOMY  5 years ago   Right great toe   HAND SURGERY Bilateral 2011-most recent   multiple hand surgeries, 2 on left, 3 on right   KNEE ARTHROPLASTY Right 04/28/2022   Procedure: COMPUTER ASSISTED TOTAL KNEE ARTHROPLASTY;  Surgeon: Dereck Leep, MD;  Location: [facility];  Service: Orthopedics;  Laterality: Right;   LOOP RECORDER INSERTION N/A 05/16/2020   Procedure: LOOP RECORDER INSERTION;  Surgeon: Isaias Cowman, MD;  Location: [facility];  Service:  Cardiovascular;  Laterality: N/A;   NASAL SINUS SURGERY  most recent 7-8 yrs ago   7 sinus surgeries    TRIGGER FINGER RELEASE  11/19/2011   Procedure: RELEASE TRIGGER FINGER/A-1 PULLEY;  Surgeon: Wynonia Sours, MD;  Location: [facility];  Service: Orthopedics;  Laterality: Right;  release a-1 pulley right index finger and cyst removal   WRIST GANGLION EXCISION  1980's   right   Patient Active Problem List   Diagnosis Date Noted   Fatigue 09/17/2022   Left hemiparesis ([place]) 09/17/2022   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   GERD (gastroesophageal reflux disease) 08/21/2022   Agitation 08/20/2022   Acute left-sided weakness 08/20/2022   Expressive aphasia    Stroke ([place]) 08/19/2022   Leukocytosis 08/19/2022   History of urticaria 04/28/2022   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   Primary osteoarthritis of left knee 02/24/2022   Primary osteoarthritis of right knee 02/24/2022   Lumbar spondylolysis 04/12/2020   History of CVA (cerebrovascular accident) 03/26/2020   Low back pain radiating to right lower extremity 03/21/2020   B12 deficiency 03/06/2020   Positive anti-CCP test 12/21/2019   Arthralgia 12/13/2019   Dermatitis 12/13/2019   Rheumatoid factor positive 12/13/2019   Essential  hypertension 12/11/2018   Palpitations 12/11/2018   Acquired hypothyroidism 11/10/2018   Arthritis of knee 09/17/2016   Anxiety 11/22/2014   Asthma without status asthmaticus 11/22/2014   Benign neoplasm of colon, unspecified 11/22/2014   Environmental allergies 11/22/2014   Hypertriglyceridemia 11/22/2014   Hypokalemia 11/22/2014   Personal history of disease of skin and subcutaneous tissue 11/22/2014    ONSET DATE: 08/18/22  REFERRING DIAG: I63.81 (ICD-10-CM) - 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  Rationale for Evaluation and Treatment Rehabilitation  SUBJECTIVE:                                                                                                                                                                                               SUBJECTIVE STATEMENT:  Pt reports she was able to walk this morning around the house by herself.  Pt accompanied by: self and significant other; [name].  PERTINENT HISTORY:  Patient is a 81 year old female who was diagnosed with a right thalamic CVA on 08/18/2022 with left-sided weakness.  Patient underwent inpatient rehabilitation for 2 weeks.  Patient was assessed and was scheduled for a knee replacement on 08/29/2022 however had to cancel it due to having had a CVA.  Patient had a recent fall 2 days after discharging from inpatient rehab. past medical history includes: Knee replacement, essential HTN, hypokalemia, leukocytosis, seizures, positive anti-- CCP test, anxiety disorder, mini strokes.  Patient had shingles with left eye nerve pain s/p 1 year ago.   PAIN:  Are you having pain? Yes, pt is having 7/10 pain in the L shoulder and also in the L knee.    PRECAUTIONS: None  WEIGHT BEARING RESTRICTIONS No  FALLS: Has patient fallen in last 6 months? Yes. Number of falls 3  LIVING ENVIRONMENT: Lives with: lives with their spouse Lives in: House/apartment Stairs: Yes: Internal: 12 steps; on left going up and External: 6 in the back steps; can reach both Has following equipment at home: Walker - 2 wheeled  PLOF: [place] Pt wants to get use of her shoulder and her legs back and improve balance.  OBJECTIVE:   DIAGNOSTIC FINDINGS:   FINDINGS: Brain: Acute infarct in the right corona radiata. Small acute infarct in the right anterior thalamus.   Ventricle size normal. Patchy white matter hyperintensity bilaterally compatible with chronic microvascular ischemia. Chronic infarcts in the thalamus bilaterally. Chronic infarcts in the lateral basal ganglia bilaterally. Negative for hemorrhage or mass.   Image quality degraded by motion   Vascular:  Normal arterial flow voids.   Skull and upper cervical spine: No focal  skeletal lesion. C1-2 arthropathy with pannus.   Sinuses/Orbits: Paranasal sinuses clear. Bilateral cataract extraction   Other: None   IMPRESSION: Acute infarct in the right corona radiata. Small acute infarct in the right anterior thalamus  COGNITION: Overall cognitive status: Within functional limits for tasks assessed   SENSATION: WFL; pt notes having a history of the hands having some numbness/tingling  COORDINATION: Pt unable to do quick coordination tests in the L UE.  Pt able to tolerate movement, but unable to do the movements quickly.  POSTURE: No Significant postural limitations   LOWER EXTREMITY MMT:    MMT Right Eval Left Eval  Hip flexion 4 4-  Hip extension 4 4-  Hip abduction 4 4-  Hip adduction 4 4-  Knee flexion 4 4-  Knee extension 4 4-  Ankle dorsiflexion 4 4-  (Blank rows = not tested)  FUNCTIONAL TESTs:  5 times sit to stand: 11.13 sec Timed up and go (TUG): 11.33 sec 6 minute walk test: Perform at evaluation 10 meter walk test: 12.18 sec  PATIENT SURVEYS:  ABC scale 53.75% FOTO 58; Predicted 68  TODAY'S TREATMENT:   TherEx:  NuStep ***  Seated hamstring curl with BTB, 2x15 each LE Seated hip abduction with BTB, 2x15 each LE  Seated LAQ, 2# AW donned, 2x15 Seated March with BTB around distal thigh, 2x15 each LE  Standing on BOSU ball: Lateral weight shifts, 2x15 each side Forward/Backward weight shifts, 2x15 Mini squats, x15  Standing Heel Raises with UE Support and heels hanging off first step, 2x15 Lateral step overs at 4" step, x15 each direction, minimal UE use Forward step ups at 4" step, x15 each LE leading     PATIENT EDUCATION: Education details: Pt educated on role of PT and services provided during current POC, along with prognosis and information about the clinic. Person educated: Patient and Spouse Education method: Explanation Education  comprehension: verbalized understanding   HOME EXERCISE PROGRAM:  Access Code: [email or web address] URL: [email or web address] Date: 09/25/2022 Prepared by: [place]  Exercises - Standing Hip Abduction with Counter Support  - 1 x daily - 7 x weekly - 3 sets - 10 reps - Standing Hip Extension with Counter Support  - 1 x daily - 7 x weekly - 3 sets - 10 reps - Standing Knee Flexion with Counter Support  - 1 x daily - 7 x weekly - 3 sets - 10 reps - Standing March with Unilateral Counter Support  - 1 x daily - 7 x weekly - 3 sets - 10 reps - Heel Toe Raises with Unilateral Counter Support  - 1 x daily - 7 x weekly - 3 sets - 10 reps    GOALS: Goals reviewed with patient? Yes  SHORT TERM GOALS: Target date: 11/06/2022  Pt will be independent with HEP in order to demonstrate increased ability to perform tasks related to occupation/hobbies. Baseline: No HEP given at eval Goal status: INITIAL  LONG TERM GOALS: Target date: 01/01/2023  1.  Patient (> 72 years old) will complete five times sit to stand test in < 10 seconds indicating an increased LE strength and improved balance. Baseline: 11.13 Goal status: INITIAL  2.  Patient will increase FOTO score to equal to or greater than  68   to demonstrate statistically significant improvement in mobility and quality of life.  Baseline: 58 Goal status: INITIAL   3.  Patient will increase ABC Balance scale by > 11% points to demonstrate decreased fall risk during functional activities.  Baseline: 53.75% Goal status: INITIAL   4.  Patient will reduce timed up and go to <11 seconds to reduce fall risk and demonstrate improved transfer/gait ability. Baseline: 11.33 sec Goal status: INITIAL  5. Patient will increase 10 meter walk test to >1.36ms as to improve gait speed for better community ambulation and to reduce fall risk. Baseline: 0.82 m/se Goal status: INITIAL  6.  Patient will increase six minute walk test distance to >1000  for progression to community ambulator and improve gait ability Baseline: 600 ft but ended at 4:34 seconds in. Goal status: INITIAL    ASSESSMENT:  CLINICAL IMPRESSION:  Pt performed well with exercises and is making good progress with functional mobility and tasks.  Pt is gaining a better understanding of her limitations as therapy progresses and she is able to voice when she is becoming too fatigued to continue with exercises during therapy sessions.  Pt was able to tolerate increased seated exercises with resistance without nay complaints.  Pt to continue with LE strengthening exercises in order to promote greater stability during ambulation and more functional tasks.      OBJECTIVE IMPAIRMENTS Abnormal gait, decreased activity tolerance, decreased balance, decreased endurance, decreased mobility, difficulty walking, decreased strength, and pain.   ACTIVITY LIMITATIONS carrying, lifting, bending, squatting, stairs, transfers, dressing, reach over head, and hygiene/grooming  PARTICIPATION LIMITATIONS: cleaning, laundry, shopping, community activity, and yard work  PERSONAL FACTORS Age, Fitness, Past/current experiences, and needing L TKA  are also affecting patient's functional outcome.   REHAB POTENTIAL: Good  CLINICAL DECISION MAKING: Stable/uncomplicated  EVALUATION COMPLEXITY: Low  PLAN: PT FREQUENCY: 2x/week  PT DURATION: 12 weeks  PLANNED INTERVENTIONS: Therapeutic exercises, Therapeutic activity, Neuromuscular re-education, Balance training, Gait training, Patient/Family education, Self Care, Joint mobilization, DME instructions, Dry Needling, Moist heat, and Manual therapy  PLAN FOR NEXT SESSION: Endurance and perform strengthening exercises necessary for improved balance.    JGwenlyn Saran PT, DPT Physical Therapist- [facility] 10/09/22, 8:41 AM

## 2022-10-13 NOTE — Therapy (Incomplete)
OUTPATIENT PHYSICAL THERAPY NEURO TREATMENT   Patient Name: [name] MRN: [id number] DOB:Mar 06, 1941, 81 y.o., female Today's Date: 10/13/2022   PCP: Idelle Crouch, MD REFERRING PROVIDER: Flora Lipps      Past Medical History:  Diagnosis Date   Anemia    Arthritis    Asthma    uses inhaler just prior to surgery to avoid attack   Back pain    from previous injury   Complication of anesthesia    has woken  up during 2 different surgery   Depression    no current issue/treatment; situation   Gallstones    GERD (gastroesophageal reflux disease)    Hiatal hernia    patient does NOT have nerve/muscle disease   History of kidney stones    HLD (hyperlipidemia)    HTN (hypertension)    Hypothyroidism    Kidney stones    Knee pain    Non-diabetic pancreatic hormone dysfunction years   pt. 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   Pancreatitis    Pneumonia    Seizures ([place])    caused by dye injected during a procedure   Shortness of breath    with exertion   Sinus problem    frequent infections/congestion   Stroke ([place]) 2021   reports having CVA in 2021 and having mini strokes before that   Thyroid disease    Past Surgical History:  Procedure Laterality Date   ABDOMINAL HYSTERECTOMY     APPENDECTOMY     CARPAL TUNNEL RELEASE  10+ years ago   bilateral   EYE SURGERY  3 yrs ago   bilateral cataracts   FOOT OSTEOTOMY  6 weeks ago   Left foot: great, 2nd & 3rd   FOOT OSTEOTOMY  5 years ago   Right great toe   HAND SURGERY Bilateral 2011-most recent   multiple hand surgeries, 2 on left, 3 on right   KNEE ARTHROPLASTY Right 04/28/2022   Procedure: COMPUTER ASSISTED TOTAL KNEE ARTHROPLASTY;  Surgeon: Dereck Leep, MD;  Location: [facility];  Service: Orthopedics;  Laterality: Right;   LOOP RECORDER INSERTION N/A 05/16/2020   Procedure: LOOP RECORDER INSERTION;  Surgeon: Isaias Cowman, MD;  Location: [facility];  Service:  Cardiovascular;  Laterality: N/A;   NASAL SINUS SURGERY  most recent 7-8 yrs ago   7 sinus surgeries    TRIGGER FINGER RELEASE  11/19/2011   Procedure: RELEASE TRIGGER FINGER/A-1 PULLEY;  Surgeon: Wynonia Sours, MD;  Location: [facility];  Service: Orthopedics;  Laterality: Right;  release a-1 pulley right index finger and cyst removal   WRIST GANGLION EXCISION  1980's   right   Patient Active Problem List   Diagnosis Date Noted   Fatigue 09/17/2022   Left hemiparesis ([place]) 09/17/2022   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   GERD (gastroesophageal reflux disease) 08/21/2022   Agitation 08/20/2022   Acute left-sided weakness 08/20/2022   Expressive aphasia    Stroke ([place]) 08/19/2022   Leukocytosis 08/19/2022   History of urticaria 04/28/2022   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   Primary osteoarthritis of left knee 02/24/2022   Primary osteoarthritis of right knee 02/24/2022   Lumbar spondylolysis 04/12/2020   History of CVA (cerebrovascular accident) 03/26/2020   Low back pain radiating to right lower extremity 03/21/2020   B12 deficiency 03/06/2020   Positive anti-CCP test 12/21/2019   Arthralgia 12/13/2019   Dermatitis 12/13/2019   Rheumatoid factor positive 12/13/2019   Essential  hypertension 12/11/2018   Palpitations 12/11/2018   Acquired hypothyroidism 11/10/2018   Arthritis of knee 09/17/2016   Anxiety 11/22/2014   Asthma without status asthmaticus 11/22/2014   Benign neoplasm of colon, unspecified 11/22/2014   Environmental allergies 11/22/2014   Hypertriglyceridemia 11/22/2014   Hypokalemia 11/22/2014   Personal history of disease of skin and subcutaneous tissue 11/22/2014    ONSET DATE: 08/18/22  REFERRING DIAG: I63.81 (ICD-10-CM) - 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  Rationale for Evaluation and Treatment Rehabilitation  SUBJECTIVE:                                                                                                                                                                                               SUBJECTIVE STATEMENT:  ***   Pt accompanied by: self and significant other; [name].  PERTINENT HISTORY:  Patient is a 81 year old female who was diagnosed with a right thalamic CVA on 08/18/2022 with left-sided weakness.  Patient underwent inpatient rehabilitation for 2 weeks.  Patient was assessed and was scheduled for a knee replacement on 08/29/2022 however had to cancel it due to having had a CVA.  Patient had a recent fall 2 days after discharging from inpatient rehab. past medical history includes: Knee replacement, essential HTN, hypokalemia, leukocytosis, seizures, positive anti-- CCP test, anxiety disorder, mini strokes.  Patient had shingles with left eye nerve pain s/p 1 year ago.   PAIN:  Are you having pain? Yes, pt is having 7/10 pain in the L shoulder and also in the L knee.    PRECAUTIONS: None  WEIGHT BEARING RESTRICTIONS No  FALLS: Has patient fallen in last 6 months? Yes. Number of falls 3  LIVING ENVIRONMENT: Lives with: lives with their spouse Lives in: House/apartment Stairs: Yes: Internal: 12 steps; on left going up and External: 6 in the back steps; can reach both Has following equipment at home: Walker - 2 wheeled  PLOF: [place] Pt wants to get use of her shoulder and her legs back and improve balance.  OBJECTIVE:   DIAGNOSTIC FINDINGS:   FINDINGS: Brain: Acute infarct in the right corona radiata. Small acute infarct in the right anterior thalamus.   Ventricle size normal. Patchy white matter hyperintensity bilaterally compatible with chronic microvascular ischemia. Chronic infarcts in the thalamus bilaterally. Chronic infarcts in the lateral basal ganglia bilaterally. Negative for hemorrhage or mass.   Image quality degraded by motion   Vascular: Normal arterial flow voids.   Skull and upper cervical spine: No  focal skeletal lesion. C1-2 arthropathy with pannus.   Sinuses/Orbits: Paranasal sinuses clear.  Bilateral cataract extraction   Other: None   IMPRESSION: Acute infarct in the right corona radiata. Small acute infarct in the right anterior thalamus  COGNITION: Overall cognitive status: Within functional limits for tasks assessed   SENSATION: WFL; pt notes having a history of the hands having some numbness/tingling  COORDINATION: Pt unable to do quick coordination tests in the L UE.  Pt able to tolerate movement, but unable to do the movements quickly.  POSTURE: No Significant postural limitations   LOWER EXTREMITY MMT:    MMT Right Eval Left Eval  Hip flexion 4 4-  Hip extension 4 4-  Hip abduction 4 4-  Hip adduction 4 4-  Knee flexion 4 4-  Knee extension 4 4-  Ankle dorsiflexion 4 4-  (Blank rows = not tested)  FUNCTIONAL TESTs:  5 times sit to stand: 11.13 sec Timed up and go (TUG): 11.33 sec 6 minute walk test: Perform at evaluation 10 meter walk test: 12.18 sec  PATIENT SURVEYS:  ABC scale 53.75% FOTO 58; Predicted 68  TODAY'S TREATMENT:   TherEx:  NuStep ***  Seated hamstring curl with BTB, 2x15 each LE Seated hip abduction with BTB, 2x15 each LE  Seated LAQ, 2# AW donned, 2x15 Seated March with BTB around distal thigh, 2x15 each LE  Standing on BOSU ball: Lateral weight shifts, 2x15 each side Forward/Backward weight shifts, 2x15 Mini squats, x15  Standing Heel Raises with UE Support and heels hanging off first step, 2x15 Lateral step overs at 4" step, x15 each direction, minimal UE use Forward step ups at 4" step, x15 each LE leading     PATIENT EDUCATION: Education details: Pt educated on role of PT and services provided during current POC, along with prognosis and information about the clinic. Person educated: Patient and Spouse Education method: Explanation Education comprehension: verbalized understanding   HOME EXERCISE  PROGRAM:  Access Code: [email or web address] URL: [email or web address] Date: 09/25/2022 Prepared by: [place]  Exercises - Standing Hip Abduction with Counter Support  - 1 x daily - 7 x weekly - 3 sets - 10 reps - Standing Hip Extension with Counter Support  - 1 x daily - 7 x weekly - 3 sets - 10 reps - Standing Knee Flexion with Counter Support  - 1 x daily - 7 x weekly - 3 sets - 10 reps - Standing March with Unilateral Counter Support  - 1 x daily - 7 x weekly - 3 sets - 10 reps - Heel Toe Raises with Unilateral Counter Support  - 1 x daily - 7 x weekly - 3 sets - 10 reps    GOALS: Goals reviewed with patient? Yes  SHORT TERM GOALS: Target date: 11/10/2022  Pt will be independent with HEP in order to demonstrate increased ability to perform tasks related to occupation/hobbies. Baseline: No HEP given at eval Goal status: INITIAL  LONG TERM GOALS: Target date: 01/05/2023  1.  Patient (> 17 years old) will complete five times sit to stand test in < 10 seconds indicating an increased LE strength and improved balance. Baseline: 11.13 Goal status: INITIAL  2.  Patient will increase FOTO score to equal to or greater than  68   to demonstrate statistically significant improvement in mobility and quality of life.  Baseline: 58 Goal status: INITIAL   3.  Patient will increase ABC Balance scale by > 11% points to demonstrate decreased fall risk during functional activities. Baseline: 53.75% Goal status: INITIAL   4.  Patient will reduce  timed up and go to <11 seconds to reduce fall risk and demonstrate improved transfer/gait ability. Baseline: 11.33 sec Goal status: INITIAL  5. Patient will increase 10 meter walk test to >1.26ms as to improve gait speed for better community ambulation and to reduce fall risk. Baseline: 0.82 m/se Goal status: INITIAL  6.  Patient will increase six minute walk test distance to >1000 for progression to community ambulator and improve gait  ability Baseline: 600 ft but ended at 4:34 seconds in. Goal status: INITIAL    ASSESSMENT:  CLINICAL IMPRESSION: ***  Pt performed well with exercises and is making good progress with functional mobility and tasks.  Pt is gaining a better understanding of her limitations as therapy progresses and she is able to voice when she is becoming too fatigued to continue with exercises during therapy sessions.  Pt was able to tolerate increased seated exercises with resistance without nay complaints.  Pt to continue with LE strengthening exercises in order to promote greater stability during ambulation and more functional tasks.      OBJECTIVE IMPAIRMENTS Abnormal gait, decreased activity tolerance, decreased balance, decreased endurance, decreased mobility, difficulty walking, decreased strength, and pain.   ACTIVITY LIMITATIONS carrying, lifting, bending, squatting, stairs, transfers, dressing, reach over head, and hygiene/grooming  PARTICIPATION LIMITATIONS: cleaning, laundry, shopping, community activity, and yard work  PERSONAL FACTORS Age, Fitness, Past/current experiences, and needing L TKA  are also affecting patient's functional outcome.   REHAB POTENTIAL: Good  CLINICAL DECISION MAKING: Stable/uncomplicated  EVALUATION COMPLEXITY: Low  PLAN: PT FREQUENCY: 2x/week  PT DURATION: 12 weeks  PLANNED INTERVENTIONS: Therapeutic exercises, Therapeutic activity, Neuromuscular re-education, Balance training, Gait training, Patient/Family education, Self Care, Joint mobilization, DME instructions, Dry Needling, Moist heat, and Manual therapy  PLAN FOR NEXT SESSION: ***Endurance and perform strengthening exercises necessary for improved balance.    JGwenlyn Saran PT, DPT Physical Therapist- [id number] 10/13/22, 5:20 PM

## 2022-10-14 ENCOUNTER — Ambulatory Visit: Payer: Medicare HMO

## 2022-10-14 ENCOUNTER — Ambulatory Visit: Payer: Medicare HMO | Admitting: Occupational Therapy

## 2022-10-15 ENCOUNTER — Encounter: Payer: Self-pay | Admitting: Physical Medicine and Rehabilitation

## 2022-10-15 ENCOUNTER — Encounter: Payer: Medicare HMO | Attending: Physical Medicine and Rehabilitation | Admitting: Physical Medicine and Rehabilitation

## 2022-10-15 VITALS — BP 116/76 | HR 70 | Ht 59.0 in | Wt 114.0 lb

## 2022-10-15 DIAGNOSIS — B0229 Other postherpetic nervous system involvement: Secondary | ICD-10-CM | POA: Diagnosis not present

## 2022-10-15 DIAGNOSIS — G8194 Hemiplegia, unspecified affecting left nondominant side: Secondary | ICD-10-CM | POA: Diagnosis not present

## 2022-10-15 DIAGNOSIS — R112 Nausea with vomiting, unspecified: Secondary | ICD-10-CM | POA: Diagnosis not present

## 2022-10-15 HISTORY — DX: Nausea with vomiting, unspecified: R11.2

## 2022-10-15 NOTE — Progress Notes (Signed)
Subjective:    Patient ID: [name], female    DOB: 05/25/1941, 81 y.o.   MRN: [id number]  HPI [name] is a 81 y.o. female 81 year old female with history of HTN, seizures, positive anti-CCP test, anxiety disorder, mini strokes who presents to PM&R clinic as a transition of care s/p IPR admission 9/15-9/29/30 for R thalamic stroke. Today, presenting for follow up or R hemiparesis, ongoing post-herpetic neuralgia, and fatigue.   Interval Hx: - For 2 weeks, she's been having liquid diarrhea and vomiting. She hasn't had any fevers, chills, chest pain. She states she is intermittently able to keep some food down. She describes the emesis as coffee grounds, without gross hematemesis, no black, tarry, or bloody stools. Husband states she has been sleeping a lot; she states her symptoms of nausea are worse with standing or moving. She was unable to do therapies because of it. She states this started around the time she started plavix, but denies any other medication changes. She also notes nausea/vomitting in the past with consumption of ground beef, but not for this long. Symptoms did not improve with OTC GERD medications. She has not been evaluated for this.   - She denies any further symptoms of worsening unilateral weakness, vision changes, sensory changes, or other signs of stroke. She has been taking her BPs daily; SBP running 110s-140s. None over 150s. No associated presyncope.  - Therapies are going better overall since last visit; unable to participate recently sue to illness.   - No recent migraines. Continues with post-herpetic neuralgia, on gabapentin, neurology not offering other medicaitons. Gabapentin continues to make her fatigued but she cannot tolerate going without it.   Pain Inventory Average Pain 5 Pain Right Now 5 My pain is  nausea/stomach pain  LOCATION OF PAIN  stomach  BOWEL Number of stools per week: 2   BLADDER Normal    Mobility walk with assistance use  a walker how many minutes can you walk? 5 ability to climb steps?  yes do you drive?  no transfers alone Do you have any goals in this area?  yes  Function disabled: date disabled 08/18/22 I need assistance with the following:  meal prep, household duties, and shopping  Neuro/Psych weakness trouble walking  Prior Studies Any changes since last visit?  no  Physicians involved in your care Any changes since last visit?  no   Family History  Problem Relation Age of Onset   Anesthesia problems Father        "bad lungs" couldn't wake him up   Heart disease Father    Heart attack Father 17   Breast cancer Maternal Grandmother    Breast cancer Maternal Aunt        x 2   Breast cancer Cousin    Pancreatic cancer Cousin    Heart attack Paternal Uncle    Stroke Paternal Grandfather    Social History   Socioeconomic History   Marital status: Married    Spouse name: [name]   Number of children: 1   Years of education: Not on file   Highest education level: Not on file  Occupational History   Occupation: retired  Tobacco Use   Smoking status: Never   Smokeless tobacco: Never  Vaping Use   Vaping Use: Never used  Substance and Sexual Activity   Alcohol use: Not Currently    Alcohol/week: 0.0 - 1.0 standard drinks of alcohol    Comment: wine once every 2-3 months  Drug use: Never   Sexual activity: Not on file  Other Topics Concern   Not on file  Social History Narrative   Not on file   Social Determinants of Health   Financial Resource Strain: Not on file  Food Insecurity: Not on file  Transportation Needs: Not on file  Physical Activity: Not on file  Stress: Not on file  Social Connections: Not on file   Past Surgical History:  Procedure Laterality Date   ABDOMINAL HYSTERECTOMY     APPENDECTOMY     CARPAL TUNNEL RELEASE  10+ years ago   bilateral   EYE SURGERY  3 yrs ago   bilateral cataracts   FOOT OSTEOTOMY  6 weeks ago   Left foot: great, 2nd & 3rd    FOOT OSTEOTOMY  5 years ago   Right great toe   HAND SURGERY Bilateral 2011-most recent   multiple hand surgeries, 2 on left, 3 on right   KNEE ARTHROPLASTY Right 04/28/2022   Procedure: COMPUTER ASSISTED TOTAL KNEE ARTHROPLASTY;  Surgeon: Dereck Leep, MD;  Location: [facility];  Service: Orthopedics;  Laterality: Right;   LOOP RECORDER INSERTION N/A 05/16/2020   Procedure: LOOP RECORDER INSERTION;  Surgeon: Isaias Cowman, MD;  Location: [facility];  Service: Cardiovascular;  Laterality: N/A;   NASAL SINUS SURGERY  most recent 7-8 yrs ago   7 sinus surgeries    TRIGGER FINGER RELEASE  11/19/2011   Procedure: RELEASE TRIGGER FINGER/A-1 PULLEY;  Surgeon: Wynonia Sours, MD;  Location: [facility];  Service: Orthopedics;  Laterality: Right;  release a-1 pulley right index finger and cyst removal   WRIST GANGLION EXCISION  1980's   right   Past Medical History:  Diagnosis Date   Anemia    Arthritis    Asthma    uses inhaler just prior to surgery to avoid attack   Back pain    from previous injury   Complication of anesthesia    has woken  up during 2 different surgery   Depression    no current issue/treatment; situation   Gallstones    GERD (gastroesophageal reflux disease)    Hiatal hernia    patient does NOT have nerve/muscle disease   History of kidney stones    HLD (hyperlipidemia)    HTN (hypertension)    Hypothyroidism    Kidney stones    Knee pain    Non-diabetic pancreatic hormone dysfunction years   pt. 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   Pancreatitis    Pneumonia    Seizures (HCC)    caused by dye injected during a procedure   Shortness of breath    with exertion   Sinus problem    frequent infections/congestion   Stroke ([place]) 2021   reports having CVA in 2021 and having mini strokes before that   Thyroid disease    BP 116/76   Pulse 70   Ht '4\' 11"'$  (1.499 m)   Wt 114 lb (51.7 kg)   SpO2 95%   BMI 23.03 kg/m    Opioid Risk Score:   Fall Risk Score:  `1  Depression screen [facility] 2/9     10/15/2022    1:02 PM 09/17/2022    1:49 PM  Depression screen PHQ 2/9  Decreased Interest 0 0  Down, Depressed, Hopeless 0 0  PHQ - 2 Score 0 0  Altered sleeping  3  Tired, decreased energy  3  Change in appetite  0  Feeling bad or failure about yourself   0  Trouble concentrating  0  Moving slowly or fidgety/restless  0  Suicidal thoughts  0  PHQ-9 Score  6  Difficult doing work/chores  Somewhat difficult     Review of Systems  Constitutional:  Positive for appetite change and fatigue. Negative for chills, diaphoresis and fever.  HENT:  Negative for congestion.   Eyes:  Negative for photophobia and visual disturbance.  Respiratory:  Positive for cough. Negative for shortness of breath.   Cardiovascular:  Negative for chest pain and palpitations.  Gastrointestinal:  Positive for abdominal distention, abdominal pain, diarrhea, nausea and vomiting.  Musculoskeletal:  Negative for joint swelling and myalgias.  Neurological:  Negative for dizziness, syncope, weakness, light-headedness, numbness and headaches.  All other systems reviewed and are negative.     Objective:   Physical Exam  Constitution: Unkept, ill appearance compared to prior exams. HEENT: PERRL, EOMI grossly intact. +mild L facial droop Resp: Mild bibasilar crackles; otherwise clear to auscultation.  Cardio: RRR. No mumurs, rubs, or gallops. No peripheral edema. Abdomen: Mildly distended. Nontender. +bowel sounds, hypoactive. Psych: Appropriate mood and affect. Neuro: AAOx4.  Strength: - LUE 4/5 throughout; 2nd finger abduction weakness - LLE 5/5 - RUE and RLE 5/5  Sensation: Intact to light touch in bilateral upper and lower extremities Coordination: Fine motor intact. No new or worsening ataxia on FTN, HTS. No drift.  Reflexes: Brisk in LUE>RUE; equal in bilateral lower extremities; no clonus on ankle jerk Tone: MAS 0  throughout    Assessment & Plan:   Georgana Romain Bruntz is a 81 y.o. female 81 year old female with history of HTN, seizures, positive anti-CCP test, anxiety disorder, mini strokes who presents to PM&R clinic as a transition of care s/p IPR admission 9/15-9/29/30 for R thalamic stroke. Today, presenting for follow up or R hemiparesis, ongoing post-herpetic neuralgia, and fatigue. Unfortunately, also presenting with 2 weeks acute illness with cough, vomiting, and diarrhea.   Nausea and vomiting, unspecified vomiting type Assessment & Plan: Encouraged patient to seek care at either ER or Urgent care, where they would have ability to perform viral testing and urgent labs to evaluate for dehydration. Patient and husband opted for Urgent Care.     Post herpetic neuralgia Assessment & Plan: I will talk to your neurologist about alternative medications to Gabapentin for your post-herpetic neuralgia, once you are recovered from your acute illness.    Left hemiparesis [facility]) Assessment & Plan: Doing well in current therapies PT, OT; continue  Follow up in 6 months      Gertie Gowda, DO 10/15/2022

## 2022-10-15 NOTE — Patient Instructions (Signed)
I will talk to your neurologist about alternative medications to Gabapentin for your post-herpetic neuralgia  Please go to the urgent care or ER for our acute illness  Follow up in 6 months

## 2022-10-15 NOTE — Assessment & Plan Note (Signed)
Doing well in current therapies PT, OT; continue  Follow up in 6 months

## 2022-10-15 NOTE — Assessment & Plan Note (Signed)
I will talk to your neurologist about alternative medications to Gabapentin for your post-herpetic neuralgia, once you are recovered from your acute illness.

## 2022-10-15 NOTE — Assessment & Plan Note (Signed)
Encouraged patient to seek care at either ER or Urgent care, where they would have ability to perform viral testing and urgent labs to evaluate for dehydration. Patient and husband opted for Urgent Care.

## 2022-10-16 ENCOUNTER — Ambulatory Visit: Payer: Medicare HMO | Admitting: Speech Pathology

## 2022-10-16 ENCOUNTER — Ambulatory Visit: Payer: Medicare HMO | Admitting: Occupational Therapy

## 2022-10-16 ENCOUNTER — Ambulatory Visit: Payer: Medicare HMO

## 2022-10-16 DIAGNOSIS — R059 Cough, unspecified: Secondary | ICD-10-CM | POA: Diagnosis not present

## 2022-10-16 DIAGNOSIS — J101 Influenza due to other identified influenza virus with other respiratory manifestations: Secondary | ICD-10-CM | POA: Diagnosis not present

## 2022-10-16 DIAGNOSIS — Z20822 Contact with and (suspected) exposure to covid-19: Secondary | ICD-10-CM | POA: Diagnosis not present

## 2022-10-16 NOTE — Therapy (Incomplete)
OUTPATIENT PHYSICAL THERAPY NEURO TREATMENT   Patient Name: [name] MRN: [id number] DOB:1941/01/18, 81 y.o., female Today's Date: 10/16/2022   PCP: Idelle Crouch, MD REFERRING PROVIDER: Flora Lipps      Past Medical History:  Diagnosis Date   Anemia    Arthritis    Asthma    uses inhaler just prior to surgery to avoid attack   Back pain    from previous injury   Complication of anesthesia    has woken  up during 2 different surgery   Depression    no current issue/treatment; situation   Gallstones    GERD (gastroesophageal reflux disease)    Hiatal hernia    patient does NOT have nerve/muscle disease   History of kidney stones    HLD (hyperlipidemia)    HTN (hypertension)    Hypothyroidism    Kidney stones    Knee pain    Non-diabetic pancreatic hormone dysfunction years   pt. 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   Pancreatitis    Pneumonia    Seizures ([place])    caused by dye injected during a procedure   Shortness of breath    with exertion   Sinus problem    frequent infections/congestion   Stroke ([place]) 2021   reports having CVA in 2021 and having mini strokes before that   Thyroid disease    Past Surgical History:  Procedure Laterality Date   ABDOMINAL HYSTERECTOMY     APPENDECTOMY     CARPAL TUNNEL RELEASE  10+ years ago   bilateral   EYE SURGERY  3 yrs ago   bilateral cataracts   FOOT OSTEOTOMY  6 weeks ago   Left foot: great, 2nd & 3rd   FOOT OSTEOTOMY  5 years ago   Right great toe   HAND SURGERY Bilateral 2011-most recent   multiple hand surgeries, 2 on left, 3 on right   KNEE ARTHROPLASTY Right 04/28/2022   Procedure: COMPUTER ASSISTED TOTAL KNEE ARTHROPLASTY;  Surgeon: Dereck Leep, MD;  Location: [facility];  Service: Orthopedics;  Laterality: Right;   LOOP RECORDER INSERTION N/A 05/16/2020   Procedure: LOOP RECORDER INSERTION;  Surgeon: Isaias Cowman, MD;  Location: [place] [place] [facility];  Service:  Cardiovascular;  Laterality: N/A;   NASAL SINUS SURGERY  most recent 7-8 yrs ago   7 sinus surgeries    TRIGGER FINGER RELEASE  11/19/2011   Procedure: RELEASE TRIGGER FINGER/A-1 PULLEY;  Surgeon: Wynonia Sours, MD;  Location: [facility];  Service: Orthopedics;  Laterality: Right;  release a-1 pulley right index finger and cyst removal   WRIST GANGLION EXCISION  1980's   right   Patient Active Problem List   Diagnosis Date Noted   Nausea and vomiting 10/15/2022   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   Fatigue 09/17/2022   Left hemiparesis ([place]) 09/17/2022   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   Primary osteoarthritis of left knee 02/24/2022   Primary osteoarthritis of right knee 02/24/2022   Lumbar spondylolysis 04/12/2020   History of CVA (cerebrovascular accident) 03/26/2020   Low back pain radiating to right lower extremity 03/21/2020   B12 deficiency 03/06/2020   Positive anti-CCP test 12/21/2019   Arthralgia 12/13/2019  Dermatitis 12/13/2019   Rheumatoid factor positive 12/13/2019   Essential hypertension 12/11/2018   Palpitations 12/11/2018   Acquired hypothyroidism 11/10/2018   Arthritis of knee 09/17/2016   Anxiety 11/22/2014   Asthma without status asthmaticus 11/22/2014   Benign neoplasm of colon, unspecified 11/22/2014   Environmental allergies 11/22/2014   Hypertriglyceridemia 11/22/2014   Hypokalemia 11/22/2014   Personal history of disease of skin and subcutaneous tissue 11/22/2014    ONSET DATE: 08/18/22  REFERRING DIAG: I63.81 (ICD-10-CM) - 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[place])  THERAPY DIAG:  No diagnosis found.  Rationale for Evaluation and Treatment Rehabilitation  SUBJECTIVE:                                                                                                                                                                                               SUBJECTIVE STATEMENT:  ***   Pt accompanied by: self and significant other; [name].  PERTINENT HISTORY:  Patient is a 81 year old female who was diagnosed with a right thalamic CVA on 08/18/2022 with left-sided weakness.  Patient underwent inpatient rehabilitation for 2 weeks.  Patient was assessed and was scheduled for a knee replacement on 08/29/2022 however had to cancel it due to having had a CVA.  Patient had a recent fall 2 days after discharging from inpatient rehab. past medical history includes: Knee replacement, essential HTN, hypokalemia, leukocytosis, seizures, positive anti-- CCP test, anxiety disorder, mini strokes.  Patient had shingles with left eye nerve pain s/p 1 year ago.   PAIN:  Are you having pain? Yes, pt is having 7/10 pain in the L shoulder and also in the L knee.    PRECAUTIONS: None  WEIGHT BEARING RESTRICTIONS No  FALLS: Has patient fallen in last 6 months? Yes. Number of falls 3  LIVING ENVIRONMENT: Lives with: lives with their spouse Lives in: House/apartment Stairs: Yes: Internal: 12 steps; on left going up and External: 6 in the back steps; can reach both Has following equipment at home: Walker - 2 wheeled  PLOF: [employer] Pt wants to get use of her shoulder and her legs back and improve balance.  OBJECTIVE:   DIAGNOSTIC FINDINGS:   FINDINGS: Brain: Acute infarct in the right corona radiata. Small acute infarct in the right anterior thalamus.   Ventricle size normal. Patchy white matter hyperintensity bilaterally compatible with chronic microvascular ischemia. Chronic infarcts in the thalamus bilaterally. Chronic infarcts in the lateral basal ganglia bilaterally. Negative for hemorrhage or mass.   Image quality degraded by motion    Vascular: Normal arterial flow voids.   Skull and upper cervical spine: No focal skeletal  lesion. C1-2 arthropathy with pannus.   Sinuses/Orbits: Paranasal sinuses clear. Bilateral cataract extraction   Other: None   IMPRESSION: Acute infarct in the right corona radiata. Small acute infarct in the right anterior thalamus  COGNITION: Overall cognitive status: Within functional limits for tasks assessed   SENSATION: WFL; pt notes having a history of the hands having some numbness/tingling  COORDINATION: Pt unable to do quick coordination tests in the L UE.  Pt able to tolerate movement, but unable to do the movements quickly.  POSTURE: No Significant postural limitations   LOWER EXTREMITY MMT:    MMT Right Eval Left Eval  Hip flexion 4 4-  Hip extension 4 4-  Hip abduction 4 4-  Hip adduction 4 4-  Knee flexion 4 4-  Knee extension 4 4-  Ankle dorsiflexion 4 4-  (Blank rows = not tested)  FUNCTIONAL TESTs:  5 times sit to stand: 11.13 sec Timed up and go (TUG): 11.33 sec 6 minute walk test: Perform at evaluation 10 meter walk test: 12.18 sec  PATIENT SURVEYS:  ABC scale 53.75% FOTO 58; Predicted 68  TODAY'S TREATMENT:   TherEx:  NuStep ***  Seated hamstring curl with BTB, 2x15 each LE Seated hip abduction with BTB, 2x15 each LE  Seated LAQ, 2# AW donned, 2x15 Seated March with BTB around distal thigh, 2x15 each LE  Standing on BOSU ball: Lateral weight shifts, 2x15 each side Forward/Backward weight shifts, 2x15 Mini squats, x15  Standing Heel Raises with UE Support and heels hanging off first step, 2x15 Lateral step overs at 4" step, x15 each direction, minimal UE use Forward step ups at 4" step, x15 each LE leading     PATIENT EDUCATION: Education details: Pt educated on role of PT and services provided during current POC, along with prognosis and information about the clinic. Person educated: Patient and Spouse Education method:  Explanation Education comprehension: verbalized understanding   HOME EXERCISE PROGRAM:  Access Code: [email or web address] URL: [email or web address] Date: 09/25/2022 Prepared by: [place]  Exercises - Standing Hip Abduction with Counter Support  - 1 x daily - 7 x weekly - 3 sets - 10 reps - Standing Hip Extension with Counter Support  - 1 x daily - 7 x weekly - 3 sets - 10 reps - Standing Knee Flexion with Counter Support  - 1 x daily - 7 x weekly - 3 sets - 10 reps - Standing March with Unilateral Counter Support  - 1 x daily - 7 x weekly - 3 sets - 10 reps - Heel Toe Raises with Unilateral Counter Support  - 1 x daily - 7 x weekly - 3 sets - 10 reps    GOALS: Goals reviewed with patient? Yes  SHORT TERM GOALS: Target date: 11/13/2022  Pt will be independent with HEP in order to demonstrate increased ability to perform tasks related to occupation/hobbies. Baseline: No HEP given at eval Goal status: INITIAL  LONG TERM GOALS: Target date: 01/08/2023  1.  Patient (> 65 years old) will complete five times sit to stand test in < 10 seconds indicating an increased LE strength and improved balance. Baseline: 11.13 Goal status: INITIAL  2.  Patient will increase FOTO score to equal to or greater than  68   to demonstrate statistically significant improvement in mobility and quality of life.  Baseline: 58 Goal status: INITIAL   3.  Patient will increase ABC Balance scale by > 11% points to demonstrate decreased fall risk during functional activities. Baseline:  53.75% Goal status: INITIAL   4.  Patient will reduce timed up and go to <11 seconds to reduce fall risk and demonstrate improved transfer/gait ability. Baseline: 11.33 sec Goal status: INITIAL  5. Patient will increase 10 meter walk test to >1.37ms as to improve gait speed for better community ambulation and to reduce fall risk. Baseline: 0.82 m/se Goal status: INITIAL  6.  Patient will increase six minute walk  test distance to >1000 for progression to community ambulator and improve gait ability Baseline: 600 ft but ended at 4:34 seconds in. Goal status: INITIAL    ASSESSMENT:  CLINICAL IMPRESSION: ***  Pt performed well with exercises and is making good progress with functional mobility and tasks.  Pt is gaining a better understanding of her limitations as therapy progresses and she is able to voice when she is becoming too fatigued to continue with exercises during therapy sessions.  Pt was able to tolerate increased seated exercises with resistance without nay complaints.  Pt to continue with LE strengthening exercises in order to promote greater stability during ambulation and more functional tasks.      OBJECTIVE IMPAIRMENTS Abnormal gait, decreased activity tolerance, decreased balance, decreased endurance, decreased mobility, difficulty walking, decreased strength, and pain.   ACTIVITY LIMITATIONS carrying, lifting, bending, squatting, stairs, transfers, dressing, reach over head, and hygiene/grooming  PARTICIPATION LIMITATIONS: cleaning, laundry, shopping, community activity, and yard work  PERSONAL FACTORS Age, Fitness, Past/current experiences, and needing L TKA  are also affecting patient's functional outcome.   REHAB POTENTIAL: Good  CLINICAL DECISION MAKING: Stable/uncomplicated  EVALUATION COMPLEXITY: Low  PLAN: PT FREQUENCY: 2x/week  PT DURATION: 12 weeks  PLANNED INTERVENTIONS: Therapeutic exercises, Therapeutic activity, Neuromuscular re-education, Balance training, Gait training, Patient/Family education, Self Care, Joint mobilization, DME instructions, Dry Needling, Moist heat, and Manual therapy  PLAN FOR NEXT SESSION: ***Endurance and perform strengthening exercises necessary for improved balance.    JGwenlyn Saran PT, DPT Physical Therapist- [facility] 10/16/22, 8:35 AM

## 2022-10-21 ENCOUNTER — Ambulatory Visit: Payer: Medicare HMO

## 2022-10-21 ENCOUNTER — Ambulatory Visit: Payer: Medicare HMO | Attending: Physical Medicine and Rehabilitation

## 2022-10-21 DIAGNOSIS — M6281 Muscle weakness (generalized): Secondary | ICD-10-CM | POA: Diagnosis not present

## 2022-10-21 DIAGNOSIS — R41841 Cognitive communication deficit: Secondary | ICD-10-CM

## 2022-10-21 DIAGNOSIS — I6381 Other cerebral infarction due to occlusion or stenosis of small artery: Secondary | ICD-10-CM

## 2022-10-21 DIAGNOSIS — R278 Other lack of coordination: Secondary | ICD-10-CM

## 2022-10-21 DIAGNOSIS — R262 Difficulty in walking, not elsewhere classified: Secondary | ICD-10-CM

## 2022-10-21 NOTE — Therapy (Signed)
OUTPATIENT PHYSICAL THERAPY NEURO TREATMENT   Patient Name: [name] MRN: [id number] DOB:01-15-1941, 81 y.o., female Today's Date: 10/21/2022   PCP: Idelle Crouch, MD REFERRING PROVIDER: Bary Leriche, PA-C   PT End of Session - 10/21/22 1517     Visit Number 6    Number of Visits 24    Date for PT Re-Evaluation 12/16/22    Authorization Type [employer] - Needs Authorization    PT Start Time 3903    PT Stop Time 1600    PT Time Calculation (min) 44 min    Equipment Utilized During Treatment Gait belt    Activity Tolerance Patient tolerated treatment well    Behavior During Therapy WFL for tasks assessed/performed               Past Medical History:  Diagnosis Date   Anemia    Arthritis    Asthma    uses inhaler just prior to surgery to avoid attack   Back pain    from previous injury   Complication of anesthesia    has woken  up during 2 different surgery   Depression    no current issue/treatment; situation   Gallstones    GERD (gastroesophageal reflux disease)    Hiatal hernia    patient does NOT have nerve/muscle disease   History of kidney stones    HLD (hyperlipidemia)    HTN (hypertension)    Hypothyroidism    Kidney stones    Knee pain    Non-diabetic pancreatic hormone dysfunction years   pt. 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   Pancreatitis    Pneumonia    Seizures (HCC)    caused by dye injected during a procedure   Shortness of breath    with exertion   Sinus problem    frequent infections/congestion   Stroke ([place]) 2021   reports having CVA in 2021 and having mini strokes before that   Thyroid disease    Past Surgical History:  Procedure Laterality Date   ABDOMINAL HYSTERECTOMY     APPENDECTOMY     CARPAL TUNNEL RELEASE  10+ years ago   bilateral   EYE SURGERY  3 yrs ago   bilateral cataracts   FOOT OSTEOTOMY  6 weeks ago   Left foot: great, 2nd & 3rd   FOOT OSTEOTOMY  5 years ago   Right great toe   HAND  SURGERY Bilateral 2011-most recent   multiple hand surgeries, 2 on left, 3 on right   KNEE ARTHROPLASTY Right 04/28/2022   Procedure: COMPUTER ASSISTED TOTAL KNEE ARTHROPLASTY;  Surgeon: Dereck Leep, MD;  Location: [facility];  Service: Orthopedics;  Laterality: Right;   LOOP RECORDER INSERTION N/A 05/16/2020   Procedure: LOOP RECORDER INSERTION;  Surgeon: Isaias Cowman, MD;  Location: [facility];  Service: Cardiovascular;  Laterality: N/A;   NASAL SINUS SURGERY  most recent 7-8 yrs ago   7 sinus surgeries    TRIGGER FINGER RELEASE  11/19/2011   Procedure: RELEASE TRIGGER FINGER/A-1 PULLEY;  Surgeon: Wynonia Sours, MD;  Location: [facility];  Service: Orthopedics;  Laterality: Right;  release a-1 pulley right index finger and cyst removal   WRIST GANGLION EXCISION  1980's   right   Patient Active Problem List   Diagnosis Date Noted   Nausea and vomiting 10/15/2022   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   Fatigue 09/17/2022   Left hemiparesis ([place]) 09/17/2022   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   GERD (gastroesophageal reflux disease) 08/21/2022   Agitation 08/20/2022   Acute left-sided weakness 08/20/2022   Expressive aphasia    Stroke ([place]) 08/19/2022   Leukocytosis 08/19/2022   History of urticaria 04/28/2022   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   Primary osteoarthritis of left knee 02/24/2022   Primary osteoarthritis of right knee 02/24/2022   Lumbar spondylolysis 04/12/2020   History of CVA (cerebrovascular accident) 03/26/2020   Low back pain radiating to right lower extremity 03/21/2020   B12 deficiency 03/06/2020   Positive anti-CCP test 12/21/2019   Arthralgia 12/13/2019   Dermatitis 12/13/2019   Rheumatoid factor positive 12/13/2019   Essential hypertension 12/11/2018   Palpitations 12/11/2018   Acquired hypothyroidism 11/10/2018   Arthritis of knee 09/17/2016   Anxiety 11/22/2014   Asthma without status asthmaticus 11/22/2014    Benign neoplasm of colon, unspecified 11/22/2014   Environmental allergies 11/22/2014   Hypertriglyceridemia 11/22/2014   Hypokalemia 11/22/2014   Personal history of disease of skin and subcutaneous tissue 11/22/2014    ONSET DATE: 08/18/22  REFERRING DIAG: I63.81 (ICD-10-CM) - 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  Rationale for Evaluation and Treatment Rehabilitation  SUBJECTIVE:                                                                                                                                                                                              SUBJECTIVE STATEMENT:  Pt notes she was experiencing the flu over the past few weeks.  She notes this is likely why she was having complications on the last visit with therapist.  Pt is ready to begin therapy, but would like to start off doing things slowly.   Pt accompanied by: self and significant other; [name].  PERTINENT HISTORY:  Patient is a 81 year old female who was diagnosed with a right thalamic CVA on 08/18/2022 with left-sided weakness.  Patient underwent inpatient rehabilitation for 2 weeks.  Patient was assessed and was scheduled for a knee replacement on 08/29/2022 however had to cancel it due to having had a CVA.  Patient had a recent fall 2 days after discharging from inpatient rehab. past medical history includes: Knee replacement, essential HTN, hypokalemia, leukocytosis, seizures, positive anti-- CCP test, anxiety disorder, mini strokes.  Patient had shingles with left eye nerve pain s/p 1 year ago.   PAIN:  Are you having pain? Yes, pt is having 5/10 pain in the L shoulder and also in the L knee.    PRECAUTIONS: None  WEIGHT BEARING RESTRICTIONS No  FALLS: Has patient fallen in last 6 months? Yes. Number of falls  3  LIVING ENVIRONMENT: Lives with: lives with their spouse Lives in: House/apartment Stairs: Yes: Internal: 12 steps; on left going up and External: 6 in  the back steps; can reach both Has following equipment at home: Walker - 2 wheeled  PLOF: [place] Pt wants to get use of her shoulder and her legs back and improve balance.  OBJECTIVE:   DIAGNOSTIC FINDINGS:   FINDINGS: Brain: Acute infarct in the right corona radiata. Small acute infarct in the right anterior thalamus.   Ventricle size normal. Patchy white matter hyperintensity bilaterally compatible with chronic microvascular ischemia. Chronic infarcts in the thalamus bilaterally. Chronic infarcts in the lateral basal ganglia bilaterally. Negative for hemorrhage or mass.   Image quality degraded by motion   Vascular: Normal arterial flow voids.   Skull and upper cervical spine: No focal skeletal lesion. C1-2 arthropathy with pannus.   Sinuses/Orbits: Paranasal sinuses clear. Bilateral cataract extraction   Other: None   IMPRESSION: Acute infarct in the right corona radiata. Small acute infarct in the right anterior thalamus  COGNITION: Overall cognitive status: Within functional limits for tasks assessed   SENSATION: WFL; pt notes having a history of the hands having some numbness/tingling  COORDINATION: Pt unable to do quick coordination tests in the L UE.  Pt able to tolerate movement, but unable to do the movements quickly.  POSTURE: No Significant postural limitations   LOWER EXTREMITY MMT:    MMT Right Eval Left Eval  Hip flexion 4 4-  Hip extension 4 4-  Hip abduction 4 4-  Hip adduction 4 4-  Knee flexion 4 4-  Knee extension 4 4-  Ankle dorsiflexion 4 4-  (Blank rows = not tested)  FUNCTIONAL TESTs:  5 times sit to stand: 11.13 sec Timed up and go (TUG): 11.33 sec 6 minute walk test: Perform at evaluation 10 meter walk test: 12.18 sec  PATIENT SURVEYS:  ABC scale 53.75% FOTO 58; Predicted 68   TODAY'S TREATMENT:   TherEx:  Round 1: Seated marches, x10 each LE Seated LAQ, x10 Seated heel raises, x10 with 3# AW  donned to thighs Seated toe raises, x10 Seated adduction into physioball, x10  Round 2: Seated marches, x10 each LE Seated LAQ, x10 Seated heel raises, x10 with 3# AW donned to thighs Seated toe raises, x10 Seated adduction into physioball, x10   PATIENT EDUCATION: Education details: Pt educated on role of PT and services provided during current POC, along with prognosis and information about the clinic. Person educated: Patient and Spouse Education method: Explanation Education comprehension: verbalized understanding   HOME EXERCISE PROGRAM:  Access Code: [email or web address] URL: [email or web address] Date: 09/25/2022 Prepared by: [place]  Exercises - Standing Hip Abduction with Counter Support  - 1 x daily - 7 x weekly - 3 sets - 10 reps - Standing Hip Extension with Counter Support  - 1 x daily - 7 x weekly - 3 sets - 10 reps - Standing Knee Flexion with Counter Support  - 1 x daily - 7 x weekly - 3 sets - 10 reps - Standing March with Unilateral Counter Support  - 1 x daily - 7 x weekly - 3 sets - 10 reps - Heel Toe Raises with Unilateral Counter Support  - 1 x daily - 7 x weekly - 3 sets - 10 reps    GOALS: Goals reviewed with patient? Yes  SHORT TERM GOALS: Target date: 11/18/2022  Pt will be independent with HEP in order to demonstrate increased  ability to perform tasks related to occupation/hobbies. Baseline: No HEP given at eval Goal status: INITIAL  LONG TERM GOALS: Target date: 01/13/2023  1.  Patient (> 61 years old) will complete five times sit to stand test in < 10 seconds indicating an increased LE strength and improved balance. Baseline: 11.13 Goal status: INITIAL  2.  Patient will increase FOTO score to equal to or greater than  68   to demonstrate statistically significant improvement in mobility and quality of life.  Baseline: 58 Goal status: INITIAL   3.  Patient will increase ABC Balance scale by > 11% points to demonstrate decreased  fall risk during functional activities. Baseline: 53.75% Goal status: INITIAL   4.  Patient will reduce timed up and go to <11 seconds to reduce fall risk and demonstrate improved transfer/gait ability. Baseline: 11.33 sec Goal status: INITIAL  5. Patient will increase 10 meter walk test to >1.41ms as to improve gait speed for better community ambulation and to reduce fall risk. Baseline: 0.82 m/se Goal status: INITIAL  6.  Patient will increase six minute walk test distance to >1000 for progression to community ambulator and improve gait ability Baseline: 600 ft but ended at 4:34 seconds in. Goal status: INITIAL    ASSESSMENT:  CLINICAL IMPRESSION:  Pt and therapist agreed to limiting treatment session due to pt still recovering from the flu and feeling weak at this time.  Pt has regressed significantly due to the sickness and would benefit from continued therapy and conservative treatment approaches in order to return to PLOF and to improve overall strength.  Pt and husband agreeable to this current plan and will continue to assess situation this week and likely into the next week.      OBJECTIVE IMPAIRMENTS Abnormal gait, decreased activity tolerance, decreased balance, decreased endurance, decreased mobility, difficulty walking, decreased strength, and pain.   ACTIVITY LIMITATIONS carrying, lifting, bending, squatting, stairs, transfers, dressing, reach over head, and hygiene/grooming  PARTICIPATION LIMITATIONS: cleaning, laundry, shopping, community activity, and yard work  PERSONAL FACTORS Age, Fitness, Past/current experiences, and needing L TKA  are also affecting patient's functional outcome.   REHAB POTENTIAL: Good  CLINICAL DECISION MAKING: Stable/uncomplicated  EVALUATION COMPLEXITY: Low  PLAN: PT FREQUENCY: 2x/week  PT DURATION: 12 weeks  PLANNED INTERVENTIONS: Therapeutic exercises, Therapeutic activity, Neuromuscular re-education, Balance training, Gait  training, Patient/Family education, Self Care, Joint mobilization, DME instructions, Dry Needling, Moist heat, and Manual therapy  PLAN FOR NEXT SESSION: Endurance and perform strengthening exercises necessary for improved balance.    JGwenlyn Saran PT, DPT [facility]rap[facility] 10/21/22, 3:17 PM

## 2022-10-21 NOTE — Therapy (Signed)
OUTPATIENT OCCUPATIONAL THERAPY NEURO TREATMENT NOTE  Patient Name: [name] MRN: [id number] DOB:15-Feb-1941, 81 y.o., female Today's Date: 10/21/2022  PCP: Dr. Doy Hutching REFERRING PROVIDER: Reesa Chew   OT End of Session - 10/21/22 2336     Visit Number 9    Number of Visits 61    Date for OT Re-Evaluation 12/02/22    Authorization Time Period Progress report period starting 09/09/2022    OT Start Time 1430    OT Stop Time 1515    OT Time Calculation (min) 45 min    Activity Tolerance Patient tolerated treatment well    Behavior During Therapy WFL for tasks assessed/performed             Past Medical History:  Diagnosis Date   Anemia    Arthritis    Asthma    uses inhaler just prior to surgery to avoid attack   Back pain    from previous injury   Complication of anesthesia    has woken  up during 2 different surgery   Depression    no current issue/treatment; situation   Gallstones    GERD (gastroesophageal reflux disease)    Hiatal hernia    patient does NOT have nerve/muscle disease   History of kidney stones    HLD (hyperlipidemia)    HTN (hypertension)    Hypothyroidism    Kidney stones    Knee pain    Non-diabetic pancreatic hormone dysfunction years   pt. 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   Pancreatitis    Pneumonia    Seizures ([place])    caused by dye injected during a procedure   Shortness of breath    with exertion   Sinus problem    frequent infections/congestion   Stroke ([place]) 2021   reports having CVA in 2021 and having mini strokes before that   Thyroid disease    Past Surgical History:  Procedure Laterality Date   ABDOMINAL HYSTERECTOMY     APPENDECTOMY     CARPAL TUNNEL RELEASE  10+ years ago   bilateral   EYE SURGERY  3 yrs ago   bilateral cataracts   FOOT OSTEOTOMY  6 weeks ago   Left foot: great, 2nd & 3rd   FOOT OSTEOTOMY  5 years ago   Right great toe   HAND SURGERY Bilateral 2011-most recent   multiple hand  surgeries, 2 on left, 3 on right   KNEE ARTHROPLASTY Right 04/28/2022   Procedure: COMPUTER ASSISTED TOTAL KNEE ARTHROPLASTY;  Surgeon: Dereck Leep, MD;  Location: [facility];  Service: Orthopedics;  Laterality: Right;   LOOP RECORDER INSERTION N/A 05/16/2020   Procedure: LOOP RECORDER INSERTION;  Surgeon: Isaias Cowman, MD;  Location: [facility];  Service: Cardiovascular;  Laterality: N/A;   NASAL SINUS SURGERY  most recent 7-8 yrs ago   7 sinus surgeries    TRIGGER FINGER RELEASE  11/19/2011   Procedure: RELEASE TRIGGER FINGER/A-1 PULLEY;  Surgeon: Wynonia Sours, MD;  Location: [facility];  Service: Orthopedics;  Laterality: Right;  release a-1 pulley right index finger and cyst removal   WRIST GANGLION EXCISION  1980's   right   Patient Active Problem List   Diagnosis Date Noted   Nausea and vomiting 10/15/2022   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   Fatigue 09/17/2022   Left hemiparesis ([place]) 09/17/2022   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   GERD (gastroesophageal reflux disease) 08/21/2022   Agitation 08/20/2022   Acute  left-sided weakness 08/20/2022   Expressive aphasia    Stroke ([place]) 08/19/2022   Leukocytosis 08/19/2022   History of urticaria 04/28/2022   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   Primary osteoarthritis of left knee 02/24/2022   Primary osteoarthritis of right knee 02/24/2022   Lumbar spondylolysis 04/12/2020   History of CVA (cerebrovascular accident) 03/26/2020   Low back pain radiating to right lower extremity 03/21/2020   B12 deficiency 03/06/2020   Positive anti-CCP test 12/21/2019   Arthralgia 12/13/2019   Dermatitis 12/13/2019   Rheumatoid factor positive 12/13/2019   Essential hypertension 12/11/2018   Palpitations 12/11/2018   Acquired hypothyroidism 11/10/2018   Arthritis of knee 09/17/2016   Anxiety 11/22/2014   Asthma without status asthmaticus 11/22/2014   Benign neoplasm of colon, unspecified 11/22/2014    Environmental allergies 11/22/2014   Hypertriglyceridemia 11/22/2014   Hypokalemia 11/22/2014   Personal history of disease of skin and subcutaneous tissue 11/22/2014    ONSET DATE: 08/18/2022  REFERRING DIAG: CVA  THERAPY DIAG:  Muscle weakness (generalized)  Other lack of coordination  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  Feeling better but weak.  Pt accompanied by: significant other  PERTINENT HISTORY: Patient is a 81 year old female who was diagnosed with a right thalamic CVA on 08/18/2022 with left-sided weakness.  Patient underwent inpatient rehabilitation for 2 weeks. Patient was assessed and was scheduled for a knee replacement on 08/29/2022 however had to cancel it due to having had a CVA.  Patient had a recent fall 2 days after discharging from inpatient rehab. past medical history includes: Knee replacement, essential HTN, hypokalemia, leukocytosis, seizures, positive anti-- CCP test, anxiety disorder, mini strokes.  Patient had shingles with left eye nerve pain s/p 1 year ago.   PRECAUTIONS: Fall  WEIGHT BEARING RESTRICTIONS No  PAIN:  Are you having pain? Yes, 5/10 left shoulder.  Pt. Reports chronic left shoulder pain due to decreased cartilage in the joint space. Pt. Reports that it started when she was in her 73's.  FALLS: Has patient fallen in last 6 months? Yes. Number of falls 1  LIVING ENVIRONMENT: Lives with: Lives with Spouse Lives in: House/apartment Stairs: 2 storey home, resides on the first floor.  External: 2 stairs front no rails, and 6 in back with rails Has following equipment at home: Single point cane, Walker - 2 wheeled, Environmental consultant - 4 wheeled, Shower bench, and bed side commode  PLOF: Independent  PATIENT GOALS  Pt. Regain the use of her left arm  OBJECTIVE:   HAND DOMINANCE: Right  ADLs: Overall  ADLs: Husband assists pt. as needed Transfers/ambulation related to ADLs:Pt. Has assistive devices. Eating: Pt. Is independent with the right hand. Pt. Is unable to cut food, and open packets Grooming: Pt. Is using her right hand, however has difficulty using her left to assist with haircare UB Dressing: Pt. Is independent donning a pullover shirt, and button down shirt. Has difficulty with buttoning, LB Dressing:  Independent donning pants, and socks. Difficulty tying shoes. Toileting: Independent Bathing: independent using the right hand, difficulty using the LUE for the right side. Tub Shower transfers: Supervision Equipment: See above for equipment   IADLs: Shopping:  Has not had the opportunity for grocery shopping yet Light housekeeping: Husband assisting with light house keeping Meal Prep:  Dependent Community mobility: Relies of family/friends Medication management: Husband Environmental manager management: TBD  Handwriting: 75% legible  MOBILITY STATUS: Hx of falls  POSTURE COMMENTS:  No Significant postural limitations Sitting balance: supported sitting balance WFL  ACTIVITY TOLERANCE: Activity tolerance:  Fatigues with 10-15 min.  FUNCTIONAL OUTCOME MEASURES: FOTO: 45  UPPER EXTREMITY ROM     Active ROM Right Eval: WFL Left eval  Shoulder flexion  55(117)  Shoulder abduction  67(108  Shoulder adduction    Shoulder extension    Shoulder internal rotation    Shoulder external rotation    Elbow flexion  122(144)  Elbow extension  -16(0)  Wrist flexion  62  Wrist extension  -22(50)  Wrist ulnar deviation    Wrist radial deviation    Wrist pronation    Wrist supination    (Blank rows = not tested)  Left digit flexion to [facility]: 2nd: 2cm, 3rd: 0cm, 4th: 0cm, 5th: 0cm   Limited Left full digit extension   UPPER EXTREMITY MMT:     MMT Right Eval: 4+/5 overall Left eval  Shoulder flexion  3-/5  Shoulder abduction  3-/5  Shoulder adduction    Shoulder  extension    Shoulder internal rotation    Shoulder external rotation    Middle trapezius    Lower trapezius    Elbow flexion  3+/5  Elbow extension  3+/5  Wrist flexion    Wrist extension  2-/5  Wrist ulnar deviation    Wrist radial deviation    Wrist pronation    Wrist supination    (Blank rows = not tested)  HAND FUNCTION: Left Unable  COORDINATION:  Impaired  SENSATION: Light touch: WFL, proprioceptive awareness: Intact  EDEMA: N/A  MUSCLE TONE: LUE: Hypotonic  COGNITION: Overall cognitive status: Within functional limits for tasks assessed Pt. Reports memory limitations.  Patient has bilateral hearing aids however was not wearing them during the initial evaluation.  VISION: Subjective report: Pt. report having shingles affecting left eye  s/p 1 year. Has nerve pain Baseline vision: Wears glasses for reading only Visual history: Report no changes  VISION ASSESSMENT:   WFL for tasks performed  PERCEPTION: Intact  PRAXIS: Impaired: Motor planning  OBSERVATIONS: Patient reports being sore following a recent fall after discharging from inpatient rehab.   TODAY'S TREATMENT:    Therapeutic Exercise: Performed cane exercises in supine with 1# dowel for chest press, shoulder flex, ER to top of head, and shoulder abd, intermittent min guard to maximize LUE form.  2 sets 10 reps each.  Performed all within pain free range.     Neuro re-ed:  LUE grasp/release of stress ball, reaching in all shoulder planes, positioned in supine.  Isolated finger taps on table top for all L hand digits; therapist stabilized other digits.  2 sets 10 reps each.  Added to HEP.   PATIENT EDUCATION: Education details: HEP progression  Person educated: Patient and Spouse Education method: Customer service manager Education comprehension: verbalized understanding, returned demonstration, and needs further education   HOME EXERCISE PROGRAM:  Assess ongoing need for  HEP    GOALS: Goals reviewed with patient? Yes  SHORT TERM GOALS: Target date: 10/21/2022      Patient will be independent with home exercise program for the left upper extremity Baseline: No current home exercise program Goal status: INITIAL  2.  Patient will independently formulate a full composite fist with the left hand.  Baseline: Second digit to the [facility]: 2cm.  Goal status: INITIAL              3:  Pt. will improve active digit extension to be able to place her hand flat tabletop surface in preparation for weightbearing proprioceptive input. Baseline: Is unable to actively full digit extension Goal status: INITIAL   LONG TERM GOALS: Target date: 12/02/2022  Patient will improve active left shoulder flexion by 20 degrees preparation for reaching for the refrigerator door. Baseline: Eval: Left shoulder flexion 55(117) Goal status: INITIAL  2.  Patient will improve left shoulder active abduction to be able to comb her hair Baseline: Eval: Left shoulder abduction is 67(108) Goal status: INITIAL  3.  Patient will patiently button her shirt with modified independence. Baseline: Eval: Patient requires increased time to complete Goal status: INITIAL  4.  Patient well improve left grip strength preparation for cutting food Baseline: Eval:  Goal status: INITIAL  5.  Pt. will independently recall adaptive  strategies for performing ADL tasks including: flossing teeth, donning bra, applying makeup. Baseline: Eval: Pt. unable to perform Goal status: INITIAL  6.  Pt. will improve Foto score by 5 points to reflect patient perceived performance improvement assessment specific ADLs  and IADLs Baseline: Eval: 45 Goal status: INITIAL    ASSESSMENT:  CLINICAL IMPRESSION:   Pt reports a set back after having the flu last week, feeling weak still.  Pt tolerated all exercises well this date with rest breaks and no increase in L shoulder pain.  ROM kept within pain free range on  mat table.  Added distal coordination exercises today with digit taps and digit abd/add on table top.  Patient continues to work on improving left upper extremity strength, wrist and digit extension, and overall LUE functioning in order to improve engagement in, and maximize independence in ADLs and IADL tasks.    PERFORMANCE DEFICITS in functional skills including ADLs, IADLs, coordination, proprioception, ROM, strength, [place], and [facility], cognitive skills including memory, and psychosocial skills including coping strategies, environmental adaptation, interpersonal interactions, and routines and behaviors.   IMPAIRMENTS are limiting patient from ADLs, IADLs, education, leisure, and social participation.   COMORBIDITIES may have co-morbidities  that affects occupational performance. Patient will benefit from skilled OT to address above impairments and improve overall function.  MODIFICATION OR ASSISTANCE TO COMPLETE EVALUATION: Min-Moderate modification of tasks or assist with assess necessary to complete an evaluation.  OT OCCUPATIONAL PROFILE AND HISTORY: Detailed assessment: Review of records and additional review of physical, cognitive, psychosocial history related to current functional performance.  CLINICAL DECISION MAKING: Moderate - several treatment options, min-mod task modification necessary  REHAB POTENTIAL: Good  EVALUATION COMPLEXITY: Moderate    PLAN: OT FREQUENCY: 3x/week  OT DURATION: 12 weeks  PLANNED INTERVENTIONS: self care/ADL training, therapeutic exercise, therapeutic activity, neuromuscular re-education, manual therapy, passive range of motion, functional mobility training, electrical stimulation, and paraffin  RECOMMENDED OTHER SERVICES: PT  CONSULTED AND AGREED WITH PLAN OF CARE: Patient and family member/caregiver  PLAN FOR NEXT SESSION: Initiate OT treatment   Leta Speller, MS, OTR/L  Darleene Cleaver, OT 10/21/2022, 11:38 PM

## 2022-10-23 ENCOUNTER — Ambulatory Visit: Payer: Medicare HMO

## 2022-10-23 ENCOUNTER — Ambulatory Visit: Payer: Medicare HMO | Admitting: Occupational Therapy

## 2022-10-23 ENCOUNTER — Ambulatory Visit: Payer: Medicare HMO | Admitting: Speech Pathology

## 2022-10-23 DIAGNOSIS — M6281 Muscle weakness (generalized): Secondary | ICD-10-CM

## 2022-10-23 DIAGNOSIS — R278 Other lack of coordination: Secondary | ICD-10-CM | POA: Diagnosis not present

## 2022-10-23 DIAGNOSIS — I6381 Other cerebral infarction due to occlusion or stenosis of small artery: Secondary | ICD-10-CM

## 2022-10-23 DIAGNOSIS — R41841 Cognitive communication deficit: Secondary | ICD-10-CM

## 2022-10-23 DIAGNOSIS — R262 Difficulty in walking, not elsewhere classified: Secondary | ICD-10-CM

## 2022-10-23 NOTE — Therapy (Signed)
Occupational Therapy Progress Note  Dates of reporting period  09/09/2022   to   10/23/2022   Patient Name: [name] MRN: [id number] DOB:[birth year], 81 y.o., female Today's Date: 10/23/2022  PCP: Dr. Doy Hutching REFERRING PROVIDER: Reesa Chew   OT End of Session - 10/23/22 1331     Visit Number 10    Number of Visits 17    Date for OT Re-Evaluation 12/02/22    Authorization Time Period Progress report period starting 09/09/2022    OT Start Time 64    OT Stop Time 1345    OT Time Calculation (min) 45 min    Activity Tolerance Patient tolerated treatment well    Behavior During Therapy WFL for tasks assessed/performed             Past Medical History:  Diagnosis Date   Anemia    Arthritis    Asthma    uses inhaler just prior to surgery to avoid attack   Back pain    from previous injury   Complication of anesthesia    has woken  up during 2 different surgery   Depression    no current issue/treatment; situation   Gallstones    GERD (gastroesophageal reflux disease)    Hiatal hernia    patient does NOT have nerve/muscle disease   History of kidney stones    HLD (hyperlipidemia)    HTN (hypertension)    Hypothyroidism    Kidney stones    Knee pain    Non-diabetic pancreatic hormone dysfunction years   pt. 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   Pancreatitis    Pneumonia    Seizures ([place])    caused by dye injected during a procedure   Shortness of breath    with exertion   Sinus problem    frequent infections/congestion   Stroke ([place]) 2021   reports having CVA in 2021 and having mini strokes before that   Thyroid disease    Past Surgical History:  Procedure Laterality Date   ABDOMINAL HYSTERECTOMY     APPENDECTOMY     CARPAL TUNNEL RELEASE  10+ years ago   bilateral   EYE SURGERY  3 yrs ago   bilateral cataracts   FOOT OSTEOTOMY  6 weeks ago   Left foot: great, 2nd & 3rd   FOOT OSTEOTOMY  5 years ago   Right great toe   HAND SURGERY  Bilateral 2011-most recent   multiple hand surgeries, 2 on left, 3 on right   KNEE ARTHROPLASTY Right 04/28/2022   Procedure: COMPUTER ASSISTED TOTAL KNEE ARTHROPLASTY;  Surgeon: Dereck Leep, MD;  Location: [facility];  Service: Orthopedics;  Laterality: Right;   LOOP RECORDER INSERTION N/A 05/16/2020   Procedure: LOOP RECORDER INSERTION;  Surgeon: Isaias Cowman, MD;  Location: [facility];  Service: Cardiovascular;  Laterality: N/A;   NASAL SINUS SURGERY  most recent 7-8 yrs ago   7 sinus surgeries    TRIGGER FINGER RELEASE  11/19/2011   Procedure: RELEASE TRIGGER FINGER/A-1 PULLEY;  Surgeon: Wynonia Sours, MD;  Location: [facility];  Service: Orthopedics;  Laterality: Right;  release a-1 pulley right index finger and cyst removal   WRIST GANGLION EXCISION  1980's   right   Patient Active Problem List   Diagnosis Date Noted   Nausea and vomiting 10/15/2022   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   Fatigue 09/17/2022   Left hemiparesis ([place]) 09/17/2022   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  GERD (gastroesophageal reflux disease) 08/21/2022   Agitation 08/20/2022   Acute left-sided weakness 08/20/2022   Expressive aphasia    Stroke ([place]) 08/19/2022   Leukocytosis 08/19/2022   History of urticaria 04/28/2022   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   Primary osteoarthritis of left knee 02/24/2022   Primary osteoarthritis of right knee 02/24/2022   Lumbar spondylolysis 04/12/2020   History of CVA (cerebrovascular accident) 03/26/2020   Low back pain radiating to right lower extremity 03/21/2020   B12 deficiency 03/06/2020   Positive anti-CCP test 12/21/2019   Arthralgia 12/13/2019   Dermatitis 12/13/2019   Rheumatoid factor positive 12/13/2019   Essential hypertension 12/11/2018   Palpitations 12/11/2018   Acquired hypothyroidism 11/10/2018   Arthritis of knee 09/17/2016   Anxiety 11/22/2014   Asthma without status asthmaticus 11/22/2014    Benign neoplasm of colon, unspecified 11/22/2014   Environmental allergies 11/22/2014   Hypertriglyceridemia 11/22/2014   Hypokalemia 11/22/2014   Personal history of disease of skin and subcutaneous tissue 11/22/2014    ONSET DATE: 08/18/2022  REFERRING DIAG: CVA  THERAPY DIAG:  Muscle weakness (generalized)  Rationale for Evaluation and Treatment Rehabilitation  SUBJECTIVE:   SUBJECTIVE STATEMENT:   Pt reports a set back after having the flu last week.  Pt. Reports feeling better today.  Pt accompanied by: significant other  PERTINENT HISTORY: Patient is a 81 year old female who was diagnosed with a right thalamic CVA on 08/18/2022 with left-sided weakness.  Patient underwent inpatient rehabilitation for 2 weeks. Patient was assessed and was scheduled for a knee replacement on 08/29/2022 however had to cancel it due to having had a CVA.  Patient had a recent fall 2 days after discharging from inpatient rehab. past medical history includes: Knee replacement, essential HTN, hypokalemia, leukocytosis, seizures, positive anti-- CCP test, anxiety disorder, mini strokes.  Patient had shingles with left eye nerve pain s/p 1 year ago.   PRECAUTIONS: Fall  WEIGHT BEARING RESTRICTIONS No  PAIN:  Are you having pain? Yes, 0/10 left shoulder.  Pt. Reports chronic left shoulder pain due to decreased cartilage in the joint space. Pt. Reports that it started when she was in her 48's.  FALLS: Has patient fallen in last 6 months? Yes. Number of falls 1  LIVING ENVIRONMENT: Lives with: Lives with Spouse Lives in: House/apartment Stairs: 2 storey home, resides on the first floor.  External: 2 stairs front no rails, and 6 in back with rails Has following equipment at home: Single point cane, Walker - 2 wheeled, Environmental consultant - 4 wheeled, Shower bench, and bed side commode  PLOF: Independent  PATIENT GOALS  Pt. Regain the use of her left arm  OBJECTIVE:   HAND DOMINANCE: Right  ADLs: Overall  ADLs: Husband assists pt. as needed Transfers/ambulation related to ADLs:Pt. Has assistive devices. Eating: Pt. Is independent with the right hand. Pt. Is unable to cut food, and open packets Grooming: Pt. Is using her right hand, however has difficulty using her left to assist with haircare UB Dressing: Pt. Is independent donning a pullover shirt, and button down shirt. Has difficulty with buttoning, LB Dressing:  Independent donning pants, and socks. Difficulty tying shoes. Toileting: Independent Bathing: independent using the right hand, difficulty using the LUE for the right side. Tub Shower transfers: Supervision Equipment: See above for equipment   IADLs: Shopping:  Has not had the opportunity for grocery shopping yet Light housekeeping: Husband assisting with light house keeping Meal Prep:  Dependent Community mobility: Relies of family/friends Medication management: Husband assisting  Financial management: TBD Handwriting: 75% legible  MOBILITY STATUS: Hx of falls  POSTURE COMMENTS:  No Significant postural limitations Sitting balance: supported sitting balance WFL  ACTIVITY TOLERANCE: Activity tolerance:  Fatigues with 10-15 min.  FUNCTIONAL OUTCOME MEASURES: FOTO: 45  UPPER EXTREMITY ROM     Active ROM Right Eval: WFL Left eval Left 10/23/22  Shoulder flexion  55(117) 135(153)  Shoulder abduction  67(108 140  Shoulder adduction     Shoulder extension     Shoulder internal rotation     Shoulder external rotation     Elbow flexion  122(144) 143  Elbow extension  -16(0) 0  Wrist flexion  62 62  Wrist extension  -22(50) 28(64)  Wrist ulnar deviation     Wrist radial deviation     Wrist pronation     Wrist supination     (Blank rows = not tested)  Left digit flexion to [facility]: 2nd: 2cm, 3rd: 0cm, 4th: 0cm, 5th: 0cm   Eval: Limited Left full digit extension  10/23/22: Limited left 2nd digit extension. Full 3rd, 4th, and 5th digit extension.    UPPER  EXTREMITY MMT:     MMT Right Eval: 4+/5 overall Left eval Left  10/23/2022  Shoulder flexion  3-/5 3+/5  Shoulder abduction  3-/5 3/5  Shoulder adduction     Shoulder extension     Shoulder internal rotation     Shoulder external rotation     Middle trapezius     Lower trapezius     Elbow flexion  3+/5 4+/5  Elbow extension  3+/5 4+/5  Wrist flexion     Wrist extension  2-/5 2+/5  Wrist ulnar deviation     Wrist radial deviation     Wrist pronation     Wrist supination     (Blank rows = not tested)  HAND FUNCTION: Eval: Left Unable  10/23/2022: Right TBA Grip strength; R:  L: 13#, Pinch strength: R:  L: 3#, 3Pt. Pinch strength: R:  L: 2#  COORDINATION: Eval: Impaired. Unable to perform 9 hole pegs test. 10/23/2022: 9 Hole Peg Test: R:    L: 5 pegs placed in 3 min. & 16 sec.   SENSATION: Light touch: WFL, proprioceptive awareness: Intact  EDEMA: N/A  MUSCLE TONE: LUE: Hypotonic  COGNITION: Overall cognitive status: Within functional limits for tasks assessed Pt. reports memory limitations.  Patient has bilateral hearing aids however was not wearing them during the initial evaluation.  VISION: Subjective report: Pt. report having shingles affecting left eye  s/p 1 year. Has nerve pain Baseline vision: Wears glasses for reading only Visual history: Report no changes  VISION ASSESSMENT:   WFL for tasks performed  PERCEPTION: Intact  PRAXIS: Impaired: Motor planning  OBSERVATIONS: Patient reports being sore following a recent fall after discharging from inpatient rehab.   TODAY'S TREATMENT:     Measurements were obtained, and goals were reviewed with the Pt. Pt. Has made excellent progress over this past progress report period, and has progressed with left shoulder ROM, LUE strength, motor control, and [facility] skills. Pt. Is now engaging her left UE more during ADL, and IADL tasks at home. Pt. Is now able to open the refrigerator door left hand, is improving  with using her left hand to comb her hair, and is attempting to use it for opening items, and is picking up large coins off of a tabletop top surface. Pt. has been sick with the flu over the past week, and reports increased  weakness overall. Patient continues to work on improving left upper extremity strength, wrist and digit extension, and overall  LUE functioning in order to improve engagement in, and maximize independence in ADLs and IADL tasks.    PATIENT EDUCATION: Education details: OT services, POC, and goals. Person educated: Patient and Spouse Education method: Customer service manager Education comprehension: verbalized understanding, returned demonstration, and needs further education   HOME EXERCISE PROGRAM:  Assess ongoing need for HEP    GOALS: Goals reviewed with patient? Yes  SHORT TERM GOALS: Target date: 12/02/2022   Patient will be independent with home exercise program for the left upper extremity Baseline: 10/23/22: Pt. Is independent with Home exercise recommendations provided to date.  Eval: No current home exercise program Goal status: Ongoing  2.  Patient will independently formulate a full composite fist with the left hand.  Baseline:12/23/2021: Pt. Is able to formulate a full composite fist with the left hand. Eval: Second digit to the [facility]: 2cm.  Goal status: Met              3: Pt. will improve active digit extension to be able to place her hand flat tabletop surface in preparation for weightbearing proprioceptive input. Baseline: 10/23/2022: Pt. is able to achieve full 3rd, 4th, and 5th digit extension. Pt. presents with limited 2nd digit extension. Eval: Pt. Is unable to actively full digit extension Goal status:  Partially met   LONG TERM GOALS: Target date: 12/02/2022  Patient will improve active left shoulder flexion by 20 degrees in preparation for reaching for the refrigerator door. Baseline: 10/23/2022: 135(153) Eval: Left shoulder flexion  55(117) Goal status: Met  2.  Patient will improve left shoulder active abduction to be able to comb her hair Baseline: 10/23/2022: shoulder abduction: 140. Pt. Has difficulty sustaining UEs in elevation while performing hair care. Eval: Left shoulder abduction is 67(108) Goal status: Partially Met; advised see below  3.  Patient will improve left hand [facility] skills in order to be able to button her shirt with modified independence. Baseline: 10/23/2022:  Left [place] on 9 hole peg test: 5 pegs placed in 82mn. &16 sec. Pt. Continues to have difficulty completing buttoning efficiently.Eval: Patient requires increased time to complete Goal status: Revised  4.  Patient well improve left grip strength preparation for cutting food Baseline: 10/23/2022: Left grip strength: 13# Eval: Pt. was unable to formulate a grip to hold items Goal status: Ongoing  5.  Pt. will independently recall adaptive  strategies for performing ADL tasks including: flossing teeth, donning bra, applying makeup. Baseline: 10/23/2022: Pt. Is starting to engage her left hand during more daily tasks. Eval: Pt. unable to perform Goal status: Ongoing  6.  Pt. will improve FOTO score by 5 points to reflect patient perceived performance improvement assessment specific ADLs  and IADLs Baseline: 10/23/2022: 54 Eval: 45 Goal status: ongoing  2.  Patient will improve left shoulder strength in order to be able to sustain UEs in elevation while performing hair care.  Baseline: 10/23/2022: Shoulder flexion: 3+/5, abduction: 3/5 Goal status: New     ASSESSMENT:  CLINICAL IMPRESSION:    Measurements were obtained, and goals were reviewed with the Pt. Pt. Has made excellent progress over this past progress report period, and has progressed with left shoulder ROM, LUE strength, motor control, and [facility]. Pt. Is now engaging her left UE more during ADL, and IADL tasks at home. Pt. Is now able to open the refrigerator door left hand, is  improving  with using her left hand to comb her hair, and is attempting to use it for opening items, and is picking up large coins off of a tabletop top surface. Pt. has been sick with the flu over the past week, and reports increased weakness overall. Patient continues to work on improving left upper extremity strength, wrist and digit extension, and overall  LUE functioning in order to improve engagement in, and maximize independence in ADLs and IADL tasks.    PERFORMANCE DEFICITS in functional skills including ADLs, IADLs, coordination, proprioception, ROM, strength, [place], and [facility], cognitive skills including memory, and psychosocial skills including coping strategies, environmental adaptation, interpersonal interactions, and routines and behaviors.   IMPAIRMENTS are limiting patient from ADLs, IADLs, education, leisure, and social participation.   COMORBIDITIES may have co-morbidities  that affects occupational performance. Patient will benefit from skilled OT to address above impairments and improve overall function.  MODIFICATION OR ASSISTANCE TO COMPLETE EVALUATION: Min-Moderate modification of tasks or assist with assess necessary to complete an evaluation.  OT OCCUPATIONAL PROFILE AND HISTORY: Detailed assessment: Review of records and additional review of physical, cognitive, psychosocial history related to current functional performance.  CLINICAL DECISION MAKING: Moderate - several treatment options, min-mod task modification necessary  REHAB POTENTIAL: Good  EVALUATION COMPLEXITY: Moderate    PLAN: OT FREQUENCY: 3x/week  OT DURATION: 12 weeks  PLANNED INTERVENTIONS: self care/ADL training, therapeutic exercise, therapeutic activity, neuromuscular re-education, manual therapy, passive range of motion, functional mobility training, electrical stimulation, and paraffin  RECOMMENDED OTHER SERVICES: PT  CONSULTED AND AGREED WITH PLAN OF CARE: Patient and family  member/caregiver  PLAN FOR NEXT SESSION: Initiate OT treatment  Harrel Carina, MS, OTR/L  10/23/2022, 3:25 PM

## 2022-10-23 NOTE — Therapy (Signed)
OUTPATIENT PHYSICAL THERAPY NEURO TREATMENT   Patient Name: [name] MRN: [id number] DOB:September 07, 1941, 81 y.o., female Today's Date: 10/23/2022   PCP: Idelle Crouch, MD REFERRING PROVIDER: Bary Leriche, PA-C   PT End of Session - 10/23/22 1522     Visit Number 7    Number of Visits 24    Date for PT Re-Evaluation 12/16/22    Authorization Type [employer] - Needs Authorization    PT Start Time 1519    PT Stop Time 1600    PT Time Calculation (min) 41 min    Equipment Utilized During Treatment Gait belt    Activity Tolerance Patient tolerated treatment well    Behavior During Therapy WFL for tasks assessed/performed               Past Medical History:  Diagnosis Date   Anemia    Arthritis    Asthma    uses inhaler just prior to surgery to avoid attack   Back pain    from previous injury   Complication of anesthesia    has woken  up during 2 different surgery   Depression    no current issue/treatment; situation   Gallstones    GERD (gastroesophageal reflux disease)    Hiatal hernia    patient does NOT have nerve/muscle disease   History of kidney stones    HLD (hyperlipidemia)    HTN (hypertension)    Hypothyroidism    Kidney stones    Knee pain    Non-diabetic pancreatic hormone dysfunction years   pt. 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   Pancreatitis    Pneumonia    Seizures ([place])    caused by dye injected during a procedure   Shortness of breath    with exertion   Sinus problem    frequent infections/congestion   Stroke ([place]) 2021   reports having CVA in 2021 and having mini strokes before that   Thyroid disease    Past Surgical History:  Procedure Laterality Date   ABDOMINAL HYSTERECTOMY     APPENDECTOMY     CARPAL TUNNEL RELEASE  10+ years ago   bilateral   EYE SURGERY  3 yrs ago   bilateral cataracts   FOOT OSTEOTOMY  6 weeks ago   Left foot: great, 2nd & 3rd   FOOT OSTEOTOMY  5 years ago   Right great toe   HAND  SURGERY Bilateral 2011-most recent   multiple hand surgeries, 2 on left, 3 on right   KNEE ARTHROPLASTY Right 04/28/2022   Procedure: COMPUTER ASSISTED TOTAL KNEE ARTHROPLASTY;  Surgeon: Dereck Leep, MD;  Location: [facility];  Service: Orthopedics;  Laterality: Right;   LOOP RECORDER INSERTION N/A 05/16/2020   Procedure: LOOP RECORDER INSERTION;  Surgeon: Isaias Cowman, MD;  Location: [facility];  Service: Cardiovascular;  Laterality: N/A;   NASAL SINUS SURGERY  most recent 7-8 yrs ago   7 sinus surgeries    TRIGGER FINGER RELEASE  11/19/2011   Procedure: RELEASE TRIGGER FINGER/A-1 PULLEY;  Surgeon: Wynonia Sours, MD;  Location: [facility];  Service: Orthopedics;  Laterality: Right;  release a-1 pulley right index finger and cyst removal   WRIST GANGLION EXCISION  1980's   right   Patient Active Problem List   Diagnosis Date Noted   Nausea and vomiting 10/15/2022   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   Fatigue 09/17/2022   Left hemiparesis ([place]) 09/17/2022   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[place])  08/22/2022   GERD (gastroesophageal reflux disease) 08/21/2022   Agitation 08/20/2022   Acute left-sided weakness 08/20/2022   Expressive aphasia    Stroke ([place]) 08/19/2022   Leukocytosis 08/19/2022   History of urticaria 04/28/2022   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   Primary osteoarthritis of left knee 02/24/2022   Primary osteoarthritis of right knee 02/24/2022   Lumbar spondylolysis 04/12/2020   History of CVA (cerebrovascular accident) 03/26/2020   Low back pain radiating to right lower extremity 03/21/2020   B12 deficiency 03/06/2020   Positive anti-CCP test 12/21/2019   Arthralgia 12/13/2019   Dermatitis 12/13/2019   Rheumatoid factor positive 12/13/2019   Essential hypertension 12/11/2018   Palpitations 12/11/2018   Acquired hypothyroidism 11/10/2018   Arthritis of knee 09/17/2016   Anxiety 11/22/2014   Asthma without status asthmaticus 11/22/2014    Benign neoplasm of colon, unspecified 11/22/2014   Environmental allergies 11/22/2014   Hypertriglyceridemia 11/22/2014   Hypokalemia 11/22/2014   Personal history of disease of skin and subcutaneous tissue 11/22/2014    ONSET DATE: 08/18/22  REFERRING DIAG: I63.81 (ICD-10-CM) - 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  Pt notes that she was able to get back to doing more at home.  Pt received a good evaluation from OT in session prior to PT, for the L UE.   Pt accompanied by: self and significant other; [name].  PERTINENT HISTORY:  Patient is a 81 year old female who was diagnosed with a right thalamic CVA on 08/18/2022 with left-sided weakness.  Patient underwent inpatient rehabilitation for 2 weeks.  Patient was assessed and was scheduled for a knee replacement on 08/29/2022 however had to cancel it due to having had a CVA.  Patient had a recent fall 2 days after discharging from inpatient rehab. past medical history includes: Knee replacement, essential HTN, hypokalemia, leukocytosis, seizures, positive anti-- CCP test, anxiety disorder, mini strokes.  Patient had shingles with left eye nerve pain s/p 1 year ago.   PAIN:  Are you having pain? Yes, pt is having 4/10 pain in the L shoulder only.  PRECAUTIONS: None  WEIGHT BEARING RESTRICTIONS  No  FALLS: Has patient fallen in last 6 months? Yes. Number of falls 3  LIVING ENVIRONMENT: Lives with: lives with  their spouse Lives in: House/apartment Stairs: Yes: Internal: 12 steps; on left going up and External: 6 in the back steps; can reach both Has following equipment at home: Walker - 2 wheeled  PLOF: [place] Pt wants to get use of her shoulder and her legs back and improve balance.  OBJECTIVE:   DIAGNOSTIC FINDINGS:   FINDINGS: Brain: Acute infarct in the right corona radiata. Small acute infarct in the right anterior thalamus.   Ventricle size normal. Patchy white matter hyperintensity bilaterally compatible with chronic microvascular ischemia. Chronic infarcts in the thalamus bilaterally. Chronic infarcts in the lateral basal ganglia bilaterally. Negative for hemorrhage or mass.   Image quality degraded by motion   Vascular: Normal arterial flow voids.   Skull and upper cervical spine: No focal skeletal lesion. C1-2 arthropathy with pannus.   Sinuses/Orbits: Paranasal sinuses clear. Bilateral cataract extraction   Other: None   IMPRESSION: Acute infarct in the right corona radiata. Small acute infarct in the right anterior thalamus  COGNITION: Overall cognitive status: Within functional limits for tasks assessed   SENSATION: WFL; pt notes having a history of the hands having some numbness/tingling  COORDINATION: Pt unable to do quick coordination tests in the L UE.  Pt able to tolerate movement, but unable to do the movements quickly.  POSTURE: No Significant postural limitations   LOWER EXTREMITY MMT:    MMT Right Eval Left Eval  Hip flexion 4 4-  Hip extension 4 4-  Hip abduction 4 4-  Hip adduction 4 4-  Knee flexion 4 4-  Knee extension 4 4-  Ankle dorsiflexion 4 4-  (Blank rows = not tested)  FUNCTIONAL TESTs:  5 times sit to stand: 11.13 sec Timed up and go (TUG): 11.33 sec 6 minute walk test: Perform at evaluation 10 meter walk test: 12.18 sec  PATIENT SURVEYS:  ABC scale 53.75% FOTO 58; Predicted 68   TODAY'S  TREATMENT:   TherEx:  Round 1: Seated marches, x10 each LE with 4# AW donned to thighs Seated LAQ, x10 with 4# AW donned to thighs Seated heel raises, x10 with 4# AW donned to thighs Seated toe raises, x10 with 4# AW donned to thighs Seated adduction into physioball, x10 Seated step over hedgehogs, x10 with 4# AW donned to thighs  Round 2: Seated marches, x10 each LE with 4# AW donned to thighs Seated LAQ, x10 with 4# AW donned to thighs Seated heel raises, x10 with 4# AW donned to thighs Seated toe raises, x10 with 4# AW donned to thighs Seated adduction into physioball, x10   Neuro:  Ambulation across red mat for improved proprioception and balance training on unstable surface, x8 laps length of the mat, close CGA necessary for safety as pt has episodes of scissoring     PATIENT EDUCATION: Education details: Pt educated on role of PT and services provided during current POC, along with prognosis and information about the clinic. Person educated: Patient and Spouse Education method: Explanation Education comprehension: verbalized understanding   HOME EXERCISE PROGRAM:  Access Code: [email or web address] URL: [email or web address] Date: 09/25/2022 Prepared by: [place]  Exercises - Standing Hip Abduction with Counter Support  - 1 x daily - 7 x weekly - 3 sets - 10 reps - Standing Hip Extension with Counter Support  - 1 x daily - 7 x weekly - 3 sets -  10 reps - Standing Knee Flexion with Counter Support  - 1 x daily - 7 x weekly - 3 sets - 10 reps - Standing March with Unilateral Counter Support  - 1 x daily - 7 x weekly - 3 sets - 10 reps - Heel Toe Raises with Unilateral Counter Support  - 1 x daily - 7 x weekly - 3 sets - 10 reps    GOALS: Goals reviewed with patient? Yes  SHORT TERM GOALS: Target date: 11/20/2022  Pt will be independent with HEP in order to demonstrate increased ability to perform tasks related to occupation/hobbies. Baseline: No HEP  given at eval Goal status: INITIAL  LONG TERM GOALS: Target date: 01/15/2023  1.  Patient (> 87 years old) will complete five times sit to stand test in < 10 seconds indicating an increased LE strength and improved balance. Baseline: 11.13 Goal status: INITIAL  2.  Patient will increase FOTO score to equal to or greater than  68   to demonstrate statistically significant improvement in mobility and quality of life.  Baseline: 58 Goal status: INITIAL   3.  Patient will increase ABC Balance scale by > 11% points to demonstrate decreased fall risk during functional activities. Baseline: 53.75% Goal status: INITIAL   4.  Patient will reduce timed up and go to <11 seconds to reduce fall risk and demonstrate improved transfer/gait ability. Baseline: 11.33 sec Goal status: INITIAL  5. Patient will increase 10 meter walk test to >1.99ms as to improve gait speed for better community ambulation and to reduce fall risk. Baseline: 0.82 m/se Goal status: INITIAL  6.  Patient will increase six minute walk test distance to >1000 for progression to community ambulator and improve gait ability Baseline: 600 ft but ended at 4:34 seconds in. Goal status: INITIAL    ASSESSMENT:  CLINICAL IMPRESSION:  Pt performed well with the included exercises and was able to tolerate the increased weight with exercises.  Pt demonstrates decreased balance and increased scissoring of gait while walking across the red mat.  Pt performed better once given verbal cues for upright posture and looking forward rather than down.  Pt fatigued following the laps on the mat and given chance to rest prior to leaving therapy session.   Pt will continue to benefit from skilled therapy to address remaining deficits in order to improve overall QoL and return to PLOF.  Will continue to introduce more balance related tasks.    OBJECTIVE IMPAIRMENTS Abnormal gait, decreased activity tolerance, decreased balance, decreased endurance,  decreased mobility, difficulty walking, decreased strength, and pain.   ACTIVITY LIMITATIONS carrying, lifting, bending, squatting, stairs, transfers, dressing, reach over head, and hygiene/grooming  PARTICIPATION LIMITATIONS: cleaning, laundry, shopping, community activity, and yard work  PERSONAL FACTORS Age, Fitness, Past/current experiences, and needing L TKA  are also affecting patient's functional outcome.   REHAB POTENTIAL: Good  CLINICAL DECISION MAKING: Stable/uncomplicated  EVALUATION COMPLEXITY: Low  PLAN: PT FREQUENCY: 2x/week  PT DURATION: 12 weeks  PLANNED INTERVENTIONS: Therapeutic exercises, Therapeutic activity, Neuromuscular re-education, Balance training, Gait training, Patient/Family education, Self Care, Joint mobilization, DME instructions, Dry Needling, Moist heat, and Manual therapy  PLAN FOR NEXT SESSION: Endurance and perform strengthening exercises necessary for improved balance.    JGwenlyn Saran PT, DPT Physical Therapist- [facility] 10/23/22, 3:22 PM

## 2022-10-23 NOTE — Therapy (Signed)
OUTPATIENT SPEECH LANGUAGE PATHOLOGY TREATMENT NOTE DISCHARGE SUMMARY   Patient Name: [name] MRN: [id number] DOB:Mar 10, 1941, 81 y.o., female Today's Date: 10/07/2022  PCP: Fulton Reek, MD REFERRING PROVIDER: Reesa Chew, PA  END OF SESSION:   End of Session - 10/07/22 1719     Visit Number 5    Number of Visits 17    Date for SLP Re-Evaluation 11/18/22    Authorization Type [employer] Medicare HMO    Progress Note Due on Visit 10    SLP Start Time 1500    SLP Stop Time  1645    SLP Time Calculation (min) 105 min    Activity Tolerance Patient tolerated treatment well             Past Medical History:  Diagnosis Date   Anemia    Arthritis    Asthma    uses inhaler just prior to surgery to avoid attack   Back pain    from previous injury   Complication of anesthesia    has woken  up during 2 different surgery   Depression    no current issue/treatment; situation   Gallstones    GERD (gastroesophageal reflux disease)    Hiatal hernia    patient does NOT have nerve/muscle disease   History of kidney stones    HLD (hyperlipidemia)    HTN (hypertension)    Hypothyroidism    Kidney stones    Knee pain    Non-diabetic pancreatic hormone dysfunction years   pt. 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   Pancreatitis    Pneumonia    Seizures ([employer])    caused by dye injected during a procedure   Shortness of breath    with exertion   Sinus problem    frequent infections/congestion   Stroke ([place]) 2021   reports having CVA in 2021 and having mini strokes before that   Thyroid disease    Past Surgical History:  Procedure Laterality Date   ABDOMINAL HYSTERECTOMY     APPENDECTOMY     CARPAL TUNNEL RELEASE  10+ years ago   bilateral   EYE SURGERY  3 yrs ago   bilateral cataracts   FOOT OSTEOTOMY  6 weeks ago   Left foot: great, 2nd & 3rd   FOOT OSTEOTOMY  5 years ago   Right great toe   HAND SURGERY Bilateral 2011-most recent   multiple hand  surgeries, 2 on left, 3 on right   KNEE ARTHROPLASTY Right 04/28/2022   Procedure: COMPUTER ASSISTED TOTAL KNEE ARTHROPLASTY;  Surgeon: Dereck Leep, MD;  Location: [facility];  Service: Orthopedics;  Laterality: Right;   LOOP RECORDER INSERTION N/A 05/16/2020   Procedure: LOOP RECORDER INSERTION;  Surgeon: Isaias Cowman, MD;  Location: [facility];  Service: Cardiovascular;  Laterality: N/A;   NASAL SINUS SURGERY  most recent 7-8 yrs ago   7 sinus surgeries    TRIGGER FINGER RELEASE  11/19/2011   Procedure: RELEASE TRIGGER FINGER/A-1 PULLEY;  Surgeon: Wynonia Sours, MD;  Location: [facility];  Service: Orthopedics;  Laterality: Right;  release a-1 pulley right index finger and cyst removal   WRIST GANGLION EXCISION  1980's   right   Patient Active Problem List   Diagnosis Date Noted   Fatigue 09/17/2022   Left hemiparesis ([place]) 09/17/2022   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[place]) 08/22/2022   GERD (gastroesophageal reflux disease) 08/21/2022   Agitation 08/20/2022   Acute left-sided weakness 08/20/2022   Expressive aphasia  Stroke ([place]) 08/19/2022   Leukocytosis 08/19/2022   History of urticaria 04/28/2022   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   Primary osteoarthritis of left knee 02/24/2022   Primary osteoarthritis of right knee 02/24/2022   Lumbar spondylolysis 04/12/2020   History of CVA (cerebrovascular accident) 03/26/2020   Low back pain radiating to right lower extremity 03/21/2020   B12 deficiency 03/06/2020   Positive anti-CCP test 12/21/2019   Arthralgia 12/13/2019   Dermatitis 12/13/2019   Rheumatoid factor positive 12/13/2019   Essential hypertension 12/11/2018   Palpitations 12/11/2018   Acquired hypothyroidism 11/10/2018   Arthritis of knee 09/17/2016   Anxiety 11/22/2014   Asthma without status asthmaticus 11/22/2014   Benign neoplasm of colon, unspecified 11/22/2014   Environmental allergies 11/22/2014   Hypertriglyceridemia  11/22/2014   Hypokalemia 11/22/2014   Personal history of disease of skin and subcutaneous tissue 11/22/2014    ONSET DATE: 08/19/2022  REFERRING DIAG: I63.81 (ICD-10-CM) - 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[place]   PERTINENT HISTORY: Patient is a 81 y.o. female with PMH: HTN,, hypothyroidism, anxiety, seizure, shingles. She presented to [facility] ED via [facility] on 08/19/22 secondary to acute left sided weakness, lethargy, facial droop and slurred speech. Pt completed Inpatient Rehab Level therapies on 09/05/2022.      DIAGNOSTIC FINDINGS: MRI 2021 Small subacute infarct within the right frontal white matter.    MRI 08/19/2022  Acute infarct in the right corona radiata. Small acute infarct in the right anterior thalamus   Chronic microvascular ischemic change in the white matter and thalamus bilaterally. Chronic infarcts in the basal ganglia bilaterally.    THERAPY DIAG:  Cognitive communication deficit  Right thalamic stroke [facility])  Rationale for Evaluation and Treatment Rehabilitation  SUBJECTIVE: Pt missed several sessions d/t sickness  Pt accompanied by: significant other  PAIN:  Are you having pain? No  PATIENT GOALS: to improve attention, complex problem solving  OBJECTIVE:   TODAY'S TREATMENT: Skilled treatment session focused on pt's cognition goals, specifically the use of meta-cognitive strategies for executive functioning to increase accuracy and safe task completion. SLP facilitated by providing the following interventions:   Pt was Mod I to Independent with recall of meta cognitive strategies and further provided organized cohesive report of ways in which she successfully executed these strategies over the last several days.    PATIENT EDUCATION: Education details: meta-cognitive strategies; executive functions Person educated: Patient and Spouse Education method: Explanation, Demonstration, Verbal cues, and Handouts Education comprehension: verbalized understanding and  needs further education  GOALS: Goals reviewed with patient? Yes  SHORT TERM GOALS: Target date: 10 sessions   With supervision level cues, pt will recall her medicines and their function with 90% accuracy across 5 sessions.  Baseline: unable to recall Goal status: MET   2.  With supervision level cues, pt will use strategies to improve executive functioning for improved task completion.  Baseline: not using any strategies Goal status: MET   3.  With supervision level cues, pt will demonstrate divided attention to complex problem solving task for 30 minutes in a moderately distracting environment.  Baseline: < 5 minutes Goal status: MET   4.  Pt and her husband will report completion of HEP for 5 out of 7 opportunities.  Baseline: new goal Goal status: MET     LONG TERM GOALS: Target date: 11/18/2022 Pt with be independent with use of executive function strategies for functional independence within ADLs and IADLs.  Baseline: min to supervision level  Goal status: MET  ASSESSMENT:  CLINICAL IMPRESSION: Pt has made good progress over the course od skilled ST sessions and even when she missed several sessions d/t recent sickness. As a result, she has met all of her goals and is appropriate for discharge from services.    Brianna Bennett B. Rutherford Nail, M.S., CCC-SLP, Mining engineer Certified Brain Injury [place]  [place] Office [phone] Ascom [phone] Fax [phone]

## 2022-10-24 DIAGNOSIS — Z79899 Other long term (current) drug therapy: Secondary | ICD-10-CM | POA: Diagnosis not present

## 2022-10-24 DIAGNOSIS — R7309 Other abnormal glucose: Secondary | ICD-10-CM | POA: Diagnosis not present

## 2022-10-24 DIAGNOSIS — E079 Disorder of thyroid, unspecified: Secondary | ICD-10-CM | POA: Diagnosis not present

## 2022-10-24 DIAGNOSIS — R739 Hyperglycemia, unspecified: Secondary | ICD-10-CM | POA: Diagnosis not present

## 2022-10-24 DIAGNOSIS — I10 Essential (primary) hypertension: Secondary | ICD-10-CM | POA: Diagnosis not present

## 2022-10-24 DIAGNOSIS — E785 Hyperlipidemia, unspecified: Secondary | ICD-10-CM | POA: Diagnosis not present

## 2022-10-24 DIAGNOSIS — Z0000 Encounter for general adult medical examination without abnormal findings: Secondary | ICD-10-CM | POA: Diagnosis not present

## 2022-10-24 DIAGNOSIS — F419 Anxiety disorder, unspecified: Secondary | ICD-10-CM | POA: Diagnosis not present

## 2022-10-28 ENCOUNTER — Ambulatory Visit: Payer: Medicare HMO

## 2022-10-28 ENCOUNTER — Ambulatory Visit: Payer: Medicare HMO | Admitting: Occupational Therapy

## 2022-10-28 ENCOUNTER — Ambulatory Visit: Payer: Medicare HMO | Admitting: Speech Pathology

## 2022-10-28 DIAGNOSIS — R41841 Cognitive communication deficit: Secondary | ICD-10-CM

## 2022-10-28 DIAGNOSIS — M6281 Muscle weakness (generalized): Secondary | ICD-10-CM

## 2022-10-28 DIAGNOSIS — R262 Difficulty in walking, not elsewhere classified: Secondary | ICD-10-CM

## 2022-10-28 DIAGNOSIS — I6381 Other cerebral infarction due to occlusion or stenosis of small artery: Secondary | ICD-10-CM

## 2022-10-28 DIAGNOSIS — R278 Other lack of coordination: Secondary | ICD-10-CM

## 2022-10-28 NOTE — Therapy (Signed)
Pt arrived to clinic late, but is not feeling well at this time.  Pt, husband, and therapist agreed to withhold PT session today.  Pt to continue current POC at subsequent visits.     PT End of Session - 10/28/22 1619     Visit Number 7    Number of Visits 24    Date for PT Re-Evaluation 12/16/22    Authorization Type [employer] Medicare - Needs Authorization    PT Start Time 1608    PT Stop Time 1616    PT Time Calculation (min) 8 min    Equipment Utilized During Treatment Gait belt    Activity Tolerance Patient tolerated treatment well    Behavior During Therapy WFL for tasks assessed/performed               Gwenlyn Saran, PT, DPT Physical Therapist- [facility]  10/28/22, 4:23 PM

## 2022-10-28 NOTE — Therapy (Signed)
Patient Name: [name] MRN: [id number] DOB:10-29-1941, 81 y.o., female Today's Date: 10/28/2022  Pt. Arrived for therapy this afternoon. Pt. And husband report the Pt. Has not been feeling well. Pt. Reports nausea, and extreme fatigue. OT treatment was withheld this afternoon as Pt. was unable effectively attend to and participate in the treatment session. Pt.'s BP 104/62, HR 75, and SpO2 97%. Pt. and husband were advised to reach out to her physician as pt. has recently had the Flu. Pt./caregiver education was also provided about the importance of staying hydrated.   Harrel Carina, MS, OTR/L  Harrel Carina, OT 10/28/2022, 6:15 PM

## 2022-11-04 ENCOUNTER — Ambulatory Visit: Payer: Medicare HMO | Admitting: Occupational Therapy

## 2022-11-04 ENCOUNTER — Other Ambulatory Visit: Payer: Self-pay | Admitting: Physical Medicine and Rehabilitation

## 2022-11-04 ENCOUNTER — Ambulatory Visit: Payer: Medicare HMO | Admitting: Speech Pathology

## 2022-11-04 ENCOUNTER — Ambulatory Visit: Payer: Medicare HMO

## 2022-11-04 DIAGNOSIS — I6381 Other cerebral infarction due to occlusion or stenosis of small artery: Secondary | ICD-10-CM | POA: Diagnosis not present

## 2022-11-04 DIAGNOSIS — R41841 Cognitive communication deficit: Secondary | ICD-10-CM | POA: Diagnosis not present

## 2022-11-04 DIAGNOSIS — M6281 Muscle weakness (generalized): Secondary | ICD-10-CM

## 2022-11-04 DIAGNOSIS — R262 Difficulty in walking, not elsewhere classified: Secondary | ICD-10-CM | POA: Diagnosis not present

## 2022-11-04 DIAGNOSIS — R278 Other lack of coordination: Secondary | ICD-10-CM | POA: Diagnosis not present

## 2022-11-04 NOTE — Therapy (Signed)
Occupational Therapy Treatment Note  Patient Name: [name] MRN: [id number] DOB:12/30/1940, 81 y.o., female Today's Date: 11/04/2022  PCP: Dr. Doy Hutching REFERRING PROVIDER: Reesa Chew   OT End of Session - 11/04/22 1741     Visit Number 11    Number of Visits 32    Date for OT Re-Evaluation 12/02/22    Authorization Time Period Progress report period starting 09/09/2022    OT Start Time 1345    OT Stop Time 1430    OT Time Calculation (min) 45 min    Activity Tolerance Patient tolerated treatment well    Behavior During Therapy WFL for tasks assessed/performed             Past Medical History:  Diagnosis Date   Anemia    Arthritis    Asthma    uses inhaler just prior to surgery to avoid attack   Back pain    from previous injury   Complication of anesthesia    has woken  up during 2 different surgery   Depression    no current issue/treatment; situation   Gallstones    GERD (gastroesophageal reflux disease)    Hiatal hernia    patient does NOT have nerve/muscle disease   History of kidney stones    HLD (hyperlipidemia)    HTN (hypertension)    Hypothyroidism    Kidney stones    Knee pain    Non-diabetic pancreatic hormone dysfunction years   pt. 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   Pancreatitis    Pneumonia    Seizures ([place])    caused by dye injected during a procedure   Shortness of breath    with exertion   Sinus problem    frequent infections/congestion   Stroke ([place]) 2021   reports having CVA in 2021 and having mini strokes before that   Thyroid disease    Past Surgical History:  Procedure Laterality Date   ABDOMINAL HYSTERECTOMY     APPENDECTOMY     CARPAL TUNNEL RELEASE  10+ years ago   bilateral   EYE SURGERY  3 yrs ago   bilateral cataracts   FOOT OSTEOTOMY  6 weeks ago   Left foot: great, 2nd & 3rd   FOOT OSTEOTOMY  5 years ago   Right great toe   HAND SURGERY Bilateral 2011-most recent   multiple hand surgeries, 2 on  left, 3 on right   KNEE ARTHROPLASTY Right 04/28/2022   Procedure: COMPUTER ASSISTED TOTAL KNEE ARTHROPLASTY;  Surgeon: Dereck Leep, MD;  Location: [facility];  Service: Orthopedics;  Laterality: Right;   LOOP RECORDER INSERTION N/A 05/16/2020   Procedure: LOOP RECORDER INSERTION;  Surgeon: Isaias Cowman, MD;  Location: [place] [facility];  Service: Cardiovascular;  Laterality: N/A;   NASAL SINUS SURGERY  most recent 7-8 yrs ago   7 sinus surgeries    TRIGGER FINGER RELEASE  11/19/2011   Procedure: RELEASE TRIGGER FINGER/A-1 PULLEY;  Surgeon: Wynonia Sours, MD;  Location: [place];  Service: Orthopedics;  Laterality: Right;  release a-1 pulley right index finger and cyst removal   WRIST GANGLION EXCISION  1980's   right   Patient Active Problem List   Diagnosis Date Noted   Nausea and vomiting 10/15/2022   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   Fatigue 09/17/2022   Left hemiparesis ([place]) 09/17/2022   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   GERD (gastroesophageal reflux disease) 08/21/2022   Agitation 08/20/2022   Acute left-sided weakness  08/20/2022   Expressive aphasia    Stroke ([place]) 08/19/2022   Leukocytosis 08/19/2022   History of urticaria 04/28/2022   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   Primary osteoarthritis of left knee 02/24/2022   Primary osteoarthritis of right knee 02/24/2022   Lumbar spondylolysis 04/12/2020   History of CVA (cerebrovascular accident) 03/26/2020   Low back pain radiating to right lower extremity 03/21/2020   B12 deficiency 03/06/2020   Positive anti-CCP test 12/21/2019   Arthralgia 12/13/2019   Dermatitis 12/13/2019   Rheumatoid factor positive 12/13/2019   Essential hypertension 12/11/2018   Palpitations 12/11/2018   Acquired hypothyroidism 11/10/2018   Arthritis of knee 09/17/2016   Anxiety 11/22/2014   Asthma without status asthmaticus 11/22/2014   Benign neoplasm of colon, unspecified 11/22/2014    Environmental allergies 11/22/2014   Hypertriglyceridemia 11/22/2014   Hypokalemia 11/22/2014   Personal history of disease of skin and subcutaneous tissue 11/22/2014    ONSET DATE: 08/18/2022  REFERRING DIAG: CVA  THERAPY DIAG:  Muscle weakness (generalized)  Other lack of coordination  Rationale for Evaluation and Treatment Rehabilitation  SUBJECTIVE:   SUBJECTIVE STATEMENT:   Pt reports a set back after having the flu last week.  Pt. Reports feeling better today.  Pt accompanied by: significant other  PERTINENT HISTORY: Patient is a 81 year old female who was diagnosed with a right thalamic CVA on 08/18/2022 with left-sided weakness.  Patient underwent inpatient rehabilitation for 2 weeks. Patient was assessed and was scheduled for a knee replacement on 08/29/2022 however had to cancel it due to having had a CVA.  Patient had a recent fall 2 days after discharging from inpatient rehab. past medical history includes: Knee replacement, essential HTN, hypokalemia, leukocytosis, seizures, positive anti-- CCP test, anxiety disorder, mini strokes.  Patient had shingles with left eye nerve pain s/p 1 year ago.   PRECAUTIONS: Fall  WEIGHT BEARING RESTRICTIONS No  PAIN:  Are you having pain? Yes, 0/10 left shoulder.  Pt. Reports chronic left shoulder pain due to decreased cartilage in the joint space. Pt. Reports that it started when she was in her 65's.  FALLS: Has patient fallen in last 6 months? Yes. Number of falls 1  LIVING ENVIRONMENT: Lives with: Lives with Spouse Lives in: House/apartment Stairs: 2 storey home, resides on the first floor.  External: 2 stairs front no rails, and 6 in back with rails Has following equipment at home: Single point cane, Walker - 2 wheeled, Environmental consultant - 4 wheeled, Shower bench, and bed side commode  PLOF: Independent  PATIENT GOALS  Pt. Regain the use of her left arm  OBJECTIVE:   HAND DOMINANCE: Right  ADLs: Overall ADLs: Husband assists pt.  as needed Transfers/ambulation related to ADLs:Pt. Has assistive devices. Eating: Pt. Is independent with the right hand. Pt. Is unable to cut food, and open packets Grooming: Pt. Is using her right hand, however has difficulty using her left to assist with haircare UB Dressing: Pt. Is independent donning a pullover shirt, and button down shirt. Has difficulty with buttoning, LB Dressing:  Independent donning pants, and socks. Difficulty tying shoes. Toileting: Independent Bathing: independent using the right hand, difficulty using the LUE for the right side. Tub Shower transfers: Supervision Equipment: See above for equipment   IADLs: Shopping:  Has not had the opportunity for grocery shopping yet Light housekeeping: Husband assisting with light house keeping Meal Prep:  Dependent Community mobility: Relies of family/friends Medication management: Husband Oncologist: TBD Handwriting: 75% legible  MOBILITY STATUS:  Hx of falls  POSTURE COMMENTS:  No Significant postural limitations Sitting balance: supported sitting balance WFL  ACTIVITY TOLERANCE: Activity tolerance:  Fatigues with 10-15 min.  FUNCTIONAL OUTCOME MEASURES: FOTO: 45  UPPER EXTREMITY ROM     Active ROM Right Eval: WFL Left eval Left 10/23/22  Shoulder flexion  55(117) 135(153)  Shoulder abduction  67(108 140  Shoulder adduction     Shoulder extension     Shoulder internal rotation     Shoulder external rotation     Elbow flexion  122(144) 143  Elbow extension  -16(0) 0  Wrist flexion  62 62  Wrist extension  -22(50) 28(64)  Wrist ulnar deviation     Wrist radial deviation     Wrist pronation     Wrist supination     (Blank rows = not tested)  Left digit flexion to [facility]: 2nd: 2cm, 3rd: 0cm, 4th: 0cm, 5th: 0cm   Eval: Limited Left full digit extension  10/23/22: Limited left 2nd digit extension. Full 3rd, 4th, and 5th digit extension.    UPPER EXTREMITY MMT:     MMT  Right Eval: 4+/5 overall Left eval Left  10/23/2022  Shoulder flexion  3-/5 3+/5  Shoulder abduction  3-/5 3/5  Shoulder adduction     Shoulder extension     Shoulder internal rotation     Shoulder external rotation     Middle trapezius     Lower trapezius     Elbow flexion  3+/5 4+/5  Elbow extension  3+/5 4+/5  Wrist flexion     Wrist extension  2-/5 2+/5  Wrist ulnar deviation     Wrist radial deviation     Wrist pronation     Wrist supination     (Blank rows = not tested)  HAND FUNCTION: Eval: Left Unable  10/23/2022: Right TBA Grip strength; R:  L: 13#, Pinch strength: R:  L: 3#, 3Pt. Pinch strength: R:  L: 2#  COORDINATION: Eval: Impaired. Unable to perform 9 hole pegs test. 10/23/2022: 9 Hole Peg Test: R:    L: 5 pegs placed in 3 min. & 16 sec.   SENSATION: Light touch: WFL, proprioceptive awareness: Intact  EDEMA: N/A  MUSCLE TONE: LUE: Hypotonic  COGNITION: Overall cognitive status: Within functional limits for tasks assessed Pt. reports memory limitations.  Patient has bilateral hearing aids however was not wearing them during the initial evaluation.  VISION: Subjective report: Pt. report having shingles affecting left eye  s/p 1 year. Has nerve pain Baseline vision: Wears glasses for reading only Visual history: Report no changes  VISION ASSESSMENT:   WFL for tasks performed  PERCEPTION: Intact  PRAXIS: Impaired: Motor planning  OBSERVATIONS: Patient reports being sore following a recent fall after discharging from inpatient rehab.   TODAY'S TREATMENT:      There. Ex.:   Pt. performed left shoulder flexion, abduction, horizontal abduction, external rotation in preparation for reaching up to complete hair care. Pt. performed reps digit extension, and thumb IP extension at the tabletop.   Neuromuscular re-education:   Pt. worked on grasping 1/2" flat marbles. Pt. worked on isolating her 2nd digit, and sliding them off of a raised flat  surface to her thumb in preparation for grasping them. Pt. worked on releasing them into a container placed at the tabletop. Patient continues to present with increased compensation proximally with her left upper extremity, right upper extremity, trunk, and lower extremities when concentrating on fine motor coordination tasks. Pt. presented with increased  thumb IP flexion initially when grasping then 1/2" marbles, however after AROM thumb IP stretches, Pt. was able to grasp the marbles with improved thumb IP extension. Patient continues to work on improving left upper extremity strength, wrist and digit extension, and overall  LUE functioning in order to improve engagement in, and maximize independence in ADLs and IADL tasks.      PATIENT EDUCATION: Education details: OT services, POC, and goals. Person educated: Patient and Spouse Education method: Customer service manager Education comprehension: verbalized understanding, returned demonstration, and needs further education   HOME EXERCISE PROGRAM:  Assess ongoing need for HEP    GOALS: Goals reviewed with patient? Yes  SHORT TERM GOALS: Target date: 12/02/2022   Patient will be independent with home exercise program for the left upper extremity Baseline: 10/23/22: Pt. Is independent with Home exercise recommendations provided to date.  Eval: No current home exercise program Goal status: Ongoing  2.  Patient will independently formulate a full composite fist with the left hand.  Baseline:12/23/2021: Pt. Is able to formulate a full composite fist with the left hand. Eval: Second digit to the [facility]: 2cm.  Goal status: Met              3: Pt. will improve active digit extension to be able to place her hand flat tabletop surface in preparation for weightbearing proprioceptive input. Baseline: 10/23/2022: Pt. is able to achieve full 3rd, 4th, and 5th digit extension. Pt. presents with limited 2nd digit extension. Eval: Pt. Is unable to  actively full digit extension Goal status:  Partially met   LONG TERM GOALS: Target date: 12/02/2022  Patient will improve active left shoulder flexion by 20 degrees in preparation for reaching for the refrigerator door. Baseline: 10/23/2022: 135(153) Eval: Left shoulder flexion 55(117) Goal status: Met  2.  Patient will improve left shoulder active abduction to be able to comb her hair Baseline: 10/23/2022: shoulder abduction: 140. Pt. Has difficulty sustaining UEs in elevation while performing hair care. Eval: Left shoulder abduction is 67(108) Goal status: Partially Met; advised see below  3.  Patient will improve left hand [facility] skills in order to be able to button her shirt with modified independence. Baseline: 10/23/2022:  Left [place] on 9 hole peg test: 5 pegs placed in 62mn. &16 sec. Pt. Continues to have difficulty completing buttoning efficiently.Eval: Patient requires increased time to complete Goal status: Revised  4.  Patient well improve left grip strength preparation for cutting food Baseline: 10/23/2022: Left grip strength: 13# Eval: Pt. was unable to formulate a grip to hold items Goal status: Ongoing  5.  Pt. will independently recall adaptive  strategies for performing ADL tasks including: flossing teeth, donning bra, applying makeup. Baseline: 10/23/2022: Pt. Is starting to engage her left hand during more daily tasks. Eval: Pt. unable to perform Goal status: Ongoing  6.  Pt. will improve FOTO score by 5 points to reflect patient perceived performance improvement assessment specific ADLs  and IADLs Baseline: 10/23/2022: 54 Eval: 45 Goal status: ongoing  2.  Patient will improve left shoulder strength in order to be able to sustain UEs in elevation while performing hair care.  Baseline: 10/23/2022: Shoulder flexion: 3+/5, abduction: 3/5 Goal status: New     ASSESSMENT:  CLINICAL IMPRESSION:    Pt. worked on grasping 1/2" flat marbles. Pt. worked on isolating her  2nd digit, and sliding them off of a raised flat surface to her thumb in preparation for grasping them. Pt. worked on releasing them  into a container placed at the tabletop. Patient continues to present with increased compensation proximally with her left upper extremity, right upper extremity, trunk, and lower extremities when concentrating on fine motor coordination tasks. Pt. presented with increased thumb IP flexion initially when grasping then 1/2" marbles, however after AROM thumb IP stretches, Pt. was able to grasp the marbles with improved thumb IP extension. Patient continues to work on improving left upper extremity strength, wrist and digit extension, and overall  LUE functioning in order to improve engagement in, and maximize independence in ADLs and IADL tasks.    PERFORMANCE DEFICITS in functional skills including ADLs, IADLs, coordination, proprioception, ROM, strength, [place], and [facility], cognitive skills including memory, and psychosocial skills including coping strategies, environmental adaptation, interpersonal interactions, and routines and behaviors.   IMPAIRMENTS are limiting patient from ADLs, IADLs, education, leisure, and social participation.   COMORBIDITIES may have co-morbidities  that affects occupational performance. Patient will benefit from skilled OT to address above impairments and improve overall function.  MODIFICATION OR ASSISTANCE TO COMPLETE EVALUATION: Min-Moderate modification of tasks or assist with assess necessary to complete an evaluation.  OT OCCUPATIONAL PROFILE AND HISTORY: Detailed assessment: Review of records and additional review of physical, cognitive, psychosocial history related to current functional performance.  CLINICAL DECISION MAKING: Moderate - several treatment options, min-mod task modification necessary  REHAB POTENTIAL: Good  EVALUATION COMPLEXITY: Moderate    PLAN: OT FREQUENCY: 3x/week  OT DURATION: 12 weeks  PLANNED  INTERVENTIONS: self care/ADL training, therapeutic exercise, therapeutic activity, neuromuscular re-education, manual therapy, passive range of motion, functional mobility training, electrical stimulation, and paraffin  RECOMMENDED OTHER SERVICES: PT  CONSULTED AND AGREED WITH PLAN OF CARE: Patient and family member/caregiver  PLAN FOR NEXT SESSION: Initiate OT treatment  Harrel Carina, MS, OTR/L  11/04/2022, 5:56 PM

## 2022-11-05 NOTE — Therapy (Signed)
OUTPATIENT PHYSICAL THERAPY NEURO TREATMENT   Patient Name: [name] MRN: [id number] DOB:10/04/1941, 81 y.o., female Today's Date: 11/06/2022   PCP: Idelle Crouch, MD REFERRING PROVIDER: Bary Leriche, PA-C   PT End of Session - 11/06/22 1520     Visit Number 8    Number of Visits 24    Date for PT Re-Evaluation 12/16/22    Authorization Type [employer] - Needs Authorization    PT Start Time 1518    PT Stop Time 1600    PT Time Calculation (min) 42 min    Equipment Utilized During Treatment Gait belt    Activity Tolerance Patient tolerated treatment well    Behavior During Therapy WFL for tasks assessed/performed                Past Medical History:  Diagnosis Date   Anemia    Arthritis    Asthma    uses inhaler just prior to surgery to avoid attack   Back pain    from previous injury   Complication of anesthesia    has woken  up during 2 different surgery   Depression    no current issue/treatment; situation   Gallstones    GERD (gastroesophageal reflux disease)    Hiatal hernia    patient does NOT have nerve/muscle disease   History of kidney stones    HLD (hyperlipidemia)    HTN (hypertension)    Hypothyroidism    Kidney stones    Knee pain    Non-diabetic pancreatic hormone dysfunction years   pt. 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   Pancreatitis    Pneumonia    Seizures ([place])    caused by dye injected during a procedure   Shortness of breath    with exertion   Sinus problem    frequent infections/congestion   Stroke ([place]) 2021   reports having CVA in 2021 and having mini strokes before that   Thyroid disease    Past Surgical History:  Procedure Laterality Date   ABDOMINAL HYSTERECTOMY     APPENDECTOMY     CARPAL TUNNEL RELEASE  10+ years ago   bilateral   EYE SURGERY  3 yrs ago   bilateral cataracts   FOOT OSTEOTOMY  6 weeks ago   Left foot: great, 2nd & 3rd   FOOT OSTEOTOMY  5 years ago   Right great toe    HAND SURGERY Bilateral 2011-most recent   multiple hand surgeries, 2 on left, 3 on right   KNEE ARTHROPLASTY Right 04/28/2022   Procedure: COMPUTER ASSISTED TOTAL KNEE ARTHROPLASTY;  Surgeon: Dereck Leep, MD;  Location: [facility];  Service: Orthopedics;  Laterality: Right;   LOOP RECORDER INSERTION N/A 05/16/2020   Procedure: LOOP RECORDER INSERTION;  Surgeon: Isaias Cowman, MD;  Location: [facility];  Service: Cardiovascular;  Laterality: N/A;   NASAL SINUS SURGERY  most recent 7-8 yrs ago   7 sinus surgeries    TRIGGER FINGER RELEASE  11/19/2011   Procedure: RELEASE TRIGGER FINGER/A-1 PULLEY;  Surgeon: Wynonia Sours, MD;  Location: [place];  Service: Orthopedics;  Laterality: Right;  release a-1 pulley right index finger and cyst removal   WRIST GANGLION EXCISION  1980's   right   Patient Active Problem List   Diagnosis Date Noted   Nausea and vomiting 10/15/2022   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   Fatigue 09/17/2022   Left hemiparesis ([place]) 09/17/2022   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  [place]) 08/22/2022   GERD (gastroesophageal reflux disease) 08/21/2022   Agitation 08/20/2022   Acute left-sided weakness 08/20/2022   Expressive aphasia    Stroke ([place]) 08/19/2022   Leukocytosis 08/19/2022   History of urticaria 04/28/2022   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   Primary osteoarthritis of left knee 02/24/2022   Primary osteoarthritis of right knee 02/24/2022   Lumbar spondylolysis 04/12/2020   History of CVA (cerebrovascular accident) 03/26/2020   Low back pain radiating to right lower extremity 03/21/2020   B12 deficiency 03/06/2020   Positive anti-CCP test 12/21/2019   Arthralgia 12/13/2019   Dermatitis 12/13/2019   Rheumatoid factor positive 12/13/2019   Essential hypertension 12/11/2018   Palpitations 12/11/2018   Acquired hypothyroidism 11/10/2018   Arthritis of knee 09/17/2016   Anxiety 11/22/2014   Asthma without status asthmaticus  11/22/2014   Benign neoplasm of colon, unspecified 11/22/2014   Environmental allergies 11/22/2014   Hypertriglyceridemia 11/22/2014   Hypokalemia 11/22/2014   Personal history of disease of skin and subcutaneous tissue 11/22/2014    ONSET DATE: 08/18/22  REFERRING DIAG: I63.81 (ICD-10-CM) - 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[place])  THERAPY DIAG:  Muscle weakness (generalized)  Other lack of coordination  Difficulty in walking, not elsewhere classified  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[place])  Cognitive communication deficit  Rationale for Evaluation and Treatment Rehabilitation  SUBJECTIVE:                                                                                                                                                                                              SUBJECTIVE STATEMENT: Pt reports she is feeling a little better today, but still weak and having some difficulty with her stomach.   Pt accompanied by: self and significant other; [name].  PERTINENT HISTORY:  Patient is a 81 year old female who was diagnosed with a right thalamic CVA on 08/18/2022 with left-sided weakness.  Patient underwent inpatient rehabilitation for 2 weeks.  Patient was assessed and was scheduled for a knee replacement on 08/29/2022 however had to cancel it due to having had a CVA.  Patient had a recent fall 2 days after discharging from inpatient rehab. past medical history includes: Knee replacement, essential HTN, hypokalemia, leukocytosis, seizures, positive anti-- CCP test, anxiety disorder, mini strokes.  Patient had shingles with left eye nerve pain s/p 1 year ago.   PAIN:  Are you having pain? Yes, pt is having 5/10 pain in the L shoulder only.  No pain in the LE's.  PRECAUTIONS: None  WEIGHT BEARING RESTRICTIONS No  FALLS: Has patient fallen in last 6 months? Yes. Number of falls 3  LIVING ENVIRONMENT: Lives with:  lives with their spouse Lives in: House/apartment Stairs: Yes: Internal: 12  steps; on left going up and External: 6 in the back steps; can reach both Has following equipment at home: Walker - 2 wheeled  PLOF: [place] Pt wants to get use of her shoulder and her legs back and improve balance.  OBJECTIVE:   DIAGNOSTIC FINDINGS:   FINDINGS: Brain: Acute infarct in the right corona radiata. Small acute infarct in the right anterior thalamus.   Ventricle size normal. Patchy white matter hyperintensity bilaterally compatible with chronic microvascular ischemia. Chronic infarcts in the thalamus bilaterally. Chronic infarcts in the lateral basal ganglia bilaterally. Negative for hemorrhage or mass.   Image quality degraded by motion   Vascular: Normal arterial flow voids.   Skull and upper cervical spine: No focal skeletal lesion. C1-2 arthropathy with pannus.   Sinuses/Orbits: Paranasal sinuses clear. Bilateral cataract extraction   Other: None   IMPRESSION: Acute infarct in the right corona radiata. Small acute infarct in the right anterior thalamus  COGNITION: Overall cognitive status: Within functional limits for tasks assessed   SENSATION: WFL; pt notes having a history of the hands having some numbness/tingling  COORDINATION: Pt unable to do quick coordination tests in the L UE.  Pt able to tolerate movement, but unable to do the movements quickly.  POSTURE: No Significant postural limitations   LOWER EXTREMITY MMT:    MMT Right Eval Left Eval  Hip flexion 4 4-  Hip extension 4 4-  Hip abduction 4 4-  Hip adduction 4 4-  Knee flexion 4 4-  Knee extension 4 4-  Ankle dorsiflexion 4 4-  (Blank rows = not tested)  FUNCTIONAL TESTs:  5 times sit to stand: 11.13 sec Timed up and go (TUG): 11.33 sec 6 minute walk test: Perform at evaluation 10 meter walk test: 12.18 sec  PATIENT SURVEYS:  ABC scale 53.75% FOTO 58; Predicted 68   TODAY'S TREATMENT:    TherEx:  Round 1: Seated marches, x10 each LE with 4#  AW donned Seated LAQ, x10 with 4# AW Seated heel raises, x10 with 4# AW donned Seated toe raises, x10 with 4# AW donned Seated adduction into rainbow physioball, x10 Seated step over hedgehogs, x10 with 4# AW donned  Round 2: Seated marches, x10 each LE with 4# AW donned Seated LAQ, x10 with 4# AW Seated heel raises, x10 with 4# AW donned Seated toe raises, x10 with 4# AW donned Seated adduction into rainbow physioball, x10 Seated step over hedgehogs, x10 with 4# AW donned  Ambulation around the gym with 4# AW donned, 148' x 2. Two instances of instability noted with first lap, one staggering to the R, one staggering to the L.  Second bout of ambulation, pt had no instance of LOB but was ambulation slower due to fatigue.    PATIENT EDUCATION: Education details: Pt educated on role of PT and services provided during current POC, along with prognosis and information about the clinic. Person educated: Patient and Spouse Education method: Explanation Education comprehension: verbalized understanding   HOME EXERCISE PROGRAM:  Access Code: [email or web address] URL: [email or web address] Date: 09/25/2022 Prepared by: [place]  Exercises - Standing Hip Abduction with Counter Support  - 1 x daily - 7 x weekly - 3 sets - 10 reps - Standing Hip Extension with Counter Support  - 1 x daily - 7 x weekly - 3 sets - 10 reps - Standing Knee Flexion with Counter Support  - 1 x daily -  7 x weekly - 3 sets - 10 reps - Standing March with Unilateral Counter Support  - 1 x daily - 7 x weekly - 3 sets - 10 reps - Heel Toe Raises with Unilateral Counter Support  - 1 x daily - 7 x weekly - 3 sets - 10 reps    GOALS: Goals reviewed with patient? Yes  SHORT TERM GOALS: Target date: 12/04/2022  Pt will be independent with HEP in order to demonstrate increased ability to perform tasks related to occupation/hobbies. Baseline: No HEP given at eval Goal status: INITIAL  LONG TERM GOALS:  Target date: 01/29/2023  1.  Patient (> 58 years old) will complete five times sit to stand test in < 10 seconds indicating an increased LE strength and improved balance. Baseline: 11.13 Goal status: INITIAL  2.  Patient will increase FOTO score to equal to or greater than  68   to demonstrate statistically significant improvement in mobility and quality of life.  Baseline: 58 Goal status: INITIAL   3.  Patient will increase ABC Balance scale by > 11% points to demonstrate decreased fall risk during functional activities. Baseline: 53.75% Goal status: INITIAL   4.  Patient will reduce timed up and go to <11 seconds to reduce fall risk and demonstrate improved transfer/gait ability. Baseline: 11.33 sec Goal status: INITIAL  5. Patient will increase 10 meter walk test to >1.91ms as to improve gait speed for better community ambulation and to reduce fall risk. Baseline: 0.82 m/se Goal status: INITIAL  6.  Patient will increase six minute walk test distance to >1000 for progression to community ambulator and improve gait ability Baseline: 600 ft but ended at 4:34 seconds in. Goal status: INITIAL    ASSESSMENT:  CLINICAL IMPRESSION:  Pt still noticeably weaker during session but puts forth good effort throughout the session.  Pt did have some instability with gait around the gym noting 2 instances of LOB where she experienced scissoring of her gait which caused the instability.  Pt required modA to prevent uncontrolled descent to the floor.  Pt fatigued at the conclusion of therapy session, so it was ended a little short.   Pt will continue to benefit from skilled therapy to address remaining deficits in order to improve overall QoL and return to PLOF.       OBJECTIVE IMPAIRMENTS Abnormal gait, decreased activity tolerance, decreased balance, decreased endurance, decreased mobility, difficulty walking, decreased strength, and pain.   ACTIVITY LIMITATIONS carrying, lifting, bending,  squatting, stairs, transfers, dressing, reach over head, and hygiene/grooming  PARTICIPATION LIMITATIONS: cleaning, laundry, shopping, community activity, and yard work  PERSONAL FACTORS Age, Fitness, Past/current experiences, and needing L TKA  are also affecting patient's functional outcome.   REHAB POTENTIAL: Good  CLINICAL DECISION MAKING: Stable/uncomplicated  EVALUATION COMPLEXITY: Low  PLAN: PT FREQUENCY: 2x/week  PT DURATION: 12 weeks  PLANNED INTERVENTIONS: Therapeutic exercises, Therapeutic activity, Neuromuscular re-education, Balance training, Gait training, Patient/Family education, Self Care, Joint mobilization, DME instructions, Dry Needling, Moist heat, and Manual therapy  PLAN FOR NEXT SESSION:   Endurance and perform strengthening exercises necessary for improved balance.    JGwenlyn Saran PT, DPT Physical Therapist- [facility] 11/06/22, 4:52 PM

## 2022-11-06 ENCOUNTER — Ambulatory Visit: Payer: Medicare HMO

## 2022-11-06 ENCOUNTER — Encounter: Payer: Medicare HMO | Admitting: Occupational Therapy

## 2022-11-06 ENCOUNTER — Encounter: Payer: Medicare HMO | Admitting: Speech Pathology

## 2022-11-06 DIAGNOSIS — R262 Difficulty in walking, not elsewhere classified: Secondary | ICD-10-CM

## 2022-11-06 DIAGNOSIS — R278 Other lack of coordination: Secondary | ICD-10-CM

## 2022-11-06 DIAGNOSIS — R41841 Cognitive communication deficit: Secondary | ICD-10-CM | POA: Diagnosis not present

## 2022-11-06 DIAGNOSIS — M6281 Muscle weakness (generalized): Secondary | ICD-10-CM | POA: Diagnosis not present

## 2022-11-06 DIAGNOSIS — I6381 Other cerebral infarction due to occlusion or stenosis of small artery: Secondary | ICD-10-CM

## 2022-11-07 ENCOUNTER — Other Ambulatory Visit: Payer: Self-pay | Admitting: Physical Medicine and Rehabilitation

## 2022-11-11 ENCOUNTER — Encounter: Payer: Medicare HMO | Admitting: Speech Pathology

## 2022-11-11 ENCOUNTER — Ambulatory Visit: Payer: Medicare HMO

## 2022-11-11 NOTE — Therapy (Incomplete)
OUTPATIENT PHYSICAL THERAPY NEURO TREATMENT   Patient Name: [name] MRN: [id number] DOB:1941-10-31, 81 y.o., female Today's Date: 11/11/2022   PCP: Idelle Crouch, MD REFERRING PROVIDER: Flora Lipps        Past Medical History:  Diagnosis Date   Anemia    Arthritis    Asthma    uses inhaler just prior to surgery to avoid attack   Back pain    from previous injury   Complication of anesthesia    has woken  up during 2 different surgery   Depression    no current issue/treatment; situation   Gallstones    GERD (gastroesophageal reflux disease)    Hiatal hernia    patient does NOT have nerve/muscle disease   History of kidney stones    HLD (hyperlipidemia)    HTN (hypertension)    Hypothyroidism    Kidney stones    Knee pain    Non-diabetic pancreatic hormone dysfunction years   pt. 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   Pancreatitis    Pneumonia    Seizures ([place])    caused by dye injected during a procedure   Shortness of breath    with exertion   Sinus problem    frequent infections/congestion   Stroke ([place]) 2021   reports having CVA in 2021 and having mini strokes before that   Thyroid disease    Past Surgical History:  Procedure Laterality Date   ABDOMINAL HYSTERECTOMY     APPENDECTOMY     CARPAL TUNNEL RELEASE  10+ years ago   bilateral   EYE SURGERY  3 yrs ago   bilateral cataracts   FOOT OSTEOTOMY  6 weeks ago   Left foot: great, 2nd & 3rd   FOOT OSTEOTOMY  5 years ago   Right great toe   HAND SURGERY Bilateral 2011-most recent   multiple hand surgeries, 2 on left, 3 on right   KNEE ARTHROPLASTY Right 04/28/2022   Procedure: COMPUTER ASSISTED TOTAL KNEE ARTHROPLASTY;  Surgeon: Dereck Leep, MD;  Location: [facility];  Service: Orthopedics;  Laterality: Right;   LOOP RECORDER INSERTION N/A 05/16/2020   Procedure: LOOP RECORDER INSERTION;  Surgeon: Isaias Cowman, MD;  Location: [place] [facility];  Service:  Cardiovascular;  Laterality: N/A;   NASAL SINUS SURGERY  most recent 7-8 yrs ago   7 sinus surgeries    TRIGGER FINGER RELEASE  11/19/2011   Procedure: RELEASE TRIGGER FINGER/A-1 PULLEY;  Surgeon: Wynonia Sours, MD;  Location: [facility];  Service: Orthopedics;  Laterality: Right;  release a-1 pulley right index finger and cyst removal   WRIST GANGLION EXCISION  1980's   right   Patient Active Problem List   Diagnosis Date Noted   Nausea and vomiting 10/15/2022   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   Fatigue 09/17/2022   Left hemiparesis ([place]) 09/17/2022   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[place]) 08/22/2022   GERD (gastroesophageal reflux disease) 08/21/2022   Agitation 08/20/2022   Acute left-sided weakness 08/20/2022   Expressive aphasia    Stroke ([place]) 08/19/2022   Leukocytosis 08/19/2022   History of urticaria 04/28/2022   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   Primary osteoarthritis of left knee 02/24/2022   Primary osteoarthritis of right knee 02/24/2022   Lumbar spondylolysis 04/12/2020   History of CVA (cerebrovascular accident) 03/26/2020   Low back pain radiating to right lower extremity 03/21/2020   B12 deficiency 03/06/2020   Positive anti-CCP test 12/21/2019   Arthralgia  12/13/2019   Dermatitis 12/13/2019   Rheumatoid factor positive 12/13/2019   Essential hypertension 12/11/2018   Palpitations 12/11/2018   Acquired hypothyroidism 11/10/2018   Arthritis of knee 09/17/2016   Anxiety 11/22/2014   Asthma without status asthmaticus 11/22/2014   Benign neoplasm of colon, unspecified 11/22/2014   Environmental allergies 11/22/2014   Hypertriglyceridemia 11/22/2014   Hypokalemia 11/22/2014   Personal history of disease of skin and subcutaneous tissue 11/22/2014    ONSET DATE: 08/18/22  REFERRING DIAG: I63.81 (ICD-10-CM) - 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[place])  THERAPY DIAG:  No diagnosis found.  Rationale for Evaluation and Treatment Rehabilitation  SUBJECTIVE:                                                                                                                                                                       SUBJECTIVE STATEMENT: ***  Pt reports she is feeling a little better today, but still weak and having some difficulty with her stomach.   Pt accompanied by: self and significant other; [name].  PERTINENT HISTORY:  Patient is a 81 year old female who was diagnosed with a right thalamic CVA on 08/18/2022 with left-sided weakness.  Patient underwent inpatient rehabilitation for 2 weeks.  Patient was assessed and was scheduled for a knee replacement on 08/29/2022 however had to cancel it due to having had a CVA.  Patient had a recent fall 2 days after discharging from inpatient rehab. past medical history includes: Knee replacement, essential HTN, hypokalemia, leukocytosis, seizures, positive anti-- CCP test, anxiety disorder, mini strokes.  Patient had shingles with left eye nerve pain s/p 1 year ago.   PAIN:  Are you having pain? Yes, pt is having 5/10 pain in the L shoulder only.  No pain in the LE's.  PRECAUTIONS: None  WEIGHT BEARING RESTRICTIONS No  FALLS: Has patient fallen in last 6 months? Yes. Number of falls 3  LIVING ENVIRONMENT: Lives with: lives with their spouse Lives in: House/apartment Stairs: Yes: Internal: 12 steps; on left going up and External: 6 in the back steps; can reach both Has following equipment at home: Walker - 2 wheeled  PLOF: [place] Pt wants to get use of her shoulder and her legs back and improve balance.  OBJECTIVE:   DIAGNOSTIC FINDINGS:   FINDINGS: Brain: Acute infarct in the right corona radiata. Small acute infarct in the right anterior thalamus.   Ventricle size normal. Patchy white matter hyperintensity bilaterally compatible with chronic microvascular ischemia. Chronic infarcts in the thalamus bilaterally. Chronic infarcts in the lateral basal ganglia  bilaterally. Negative for hemorrhage or mass.   Image quality degraded by motion   Vascular: Normal arterial flow voids.   Skull and upper cervical spine: No focal skeletal lesion. C1-2 arthropathy  with pannus.   Sinuses/Orbits: Paranasal sinuses clear. Bilateral cataract extraction   Other: None   IMPRESSION: Acute infarct in the right corona radiata. Small acute infarct in the right anterior thalamus  COGNITION: Overall cognitive status: Within functional limits for tasks assessed   SENSATION: WFL; pt notes having a history of the hands having some numbness/tingling  COORDINATION: Pt unable to do quick coordination tests in the L UE.  Pt able to tolerate movement, but unable to do the movements quickly.  POSTURE: No Significant postural limitations   LOWER EXTREMITY MMT:    MMT Right Eval Left Eval  Hip flexion 4 4-  Hip extension 4 4-  Hip abduction 4 4-  Hip adduction 4 4-  Knee flexion 4 4-  Knee extension 4 4-  Ankle dorsiflexion 4 4-  (Blank rows = not tested)  FUNCTIONAL TESTs:  5 times sit to stand: 11.13 sec Timed up and go (TUG): 11.33 sec 6 minute walk test: Perform at evaluation 10 meter walk test: 12.18 sec  PATIENT SURVEYS:  ABC scale 53.75% FOTO 58; Predicted 68   TODAY'S TREATMENT:  ***  TherEx:  Round 1: Seated marches, x10 each LE with 4# AW donned Seated LAQ, x10 with 4# AW Seated heel raises, x10 with 4# AW donned Seated toe raises, x10 with 4# AW donned Seated adduction into rainbow physioball, x10 Seated step over hedgehogs, x10 with 4# AW donned  Round 2: Seated marches, x10 each LE with 4# AW donned Seated LAQ, x10 with 4# AW Seated heel raises, x10 with 4# AW donned Seated toe raises, x10 with 4# AW donned Seated adduction into rainbow physioball, x10 Seated step over hedgehogs, x10 with 4# AW donned  Ambulation around the gym with 4# AW donned, 148' x 2. Two instances of instability noted with first lap, one  staggering to the R, one staggering to the L.  Second bout of ambulation, pt had no instance of LOB but was ambulation slower due to fatigue.    PATIENT EDUCATION: Education details: Pt educated on role of PT and services provided during current POC, along with prognosis and information about the clinic. Person educated: Patient and Spouse Education method: Explanation Education comprehension: verbalized understanding   HOME EXERCISE PROGRAM:  Access Code: [email or web address] URL: [email or web address] Date: 09/25/2022 Prepared by: [place]  Exercises - Standing Hip Abduction with Counter Support  - 1 x daily - 7 x weekly - 3 sets - 10 reps - Standing Hip Extension with Counter Support  - 1 x daily - 7 x weekly - 3 sets - 10 reps - Standing Knee Flexion with Counter Support  - 1 x daily - 7 x weekly - 3 sets - 10 reps - Standing March with Unilateral Counter Support  - 1 x daily - 7 x weekly - 3 sets - 10 reps - Heel Toe Raises with Unilateral Counter Support  - 1 x daily - 7 x weekly - 3 sets - 10 reps    GOALS: Goals reviewed with patient? Yes  SHORT TERM GOALS: Target date: 12/09/2022  Pt will be independent with HEP in order to demonstrate increased ability to perform tasks related to occupation/hobbies. Baseline: No HEP given at eval Goal status: INITIAL  LONG TERM GOALS: Target date: 02/03/2023  1.  Patient (> 32 years old) will complete five times sit to stand test in < 10 seconds indicating an increased LE strength and improved balance. Baseline: 11.13 Goal status: INITIAL  2.  Patient will  increase FOTO score to equal to or greater than  68   to demonstrate statistically significant improvement in mobility and quality of life.  Baseline: 58 Goal status: INITIAL   3.  Patient will increase ABC Balance scale by > 11% points to demonstrate decreased fall risk during functional activities. Baseline: 53.75% Goal status: INITIAL   4.  Patient will reduce timed  up and go to <11 seconds to reduce fall risk and demonstrate improved transfer/gait ability. Baseline: 11.33 sec Goal status: INITIAL  5. Patient will increase 10 meter walk test to >1.30ms as to improve gait speed for better community ambulation and to reduce fall risk. Baseline: 0.82 m/se Goal status: INITIAL  6.  Patient will increase six minute walk test distance to >1000 for progression to community ambulator and improve gait ability Baseline: 600 ft but ended at 4:34 seconds in. Goal status: INITIAL    ASSESSMENT:  CLINICAL IMPRESSION: *** Pt still noticeably weaker during session but puts forth good effort throughout the session.  Pt did have some instability with gait around the gym noting 2 instances of LOB where she experienced scissoring of her gait which caused the instability.  Pt required modA to prevent uncontrolled descent to the floor.  Pt fatigued at the conclusion of therapy session, so it was ended a little short.   Pt will continue to benefit from skilled therapy to address remaining deficits in order to improve overall QoL and return to PLOF.       OBJECTIVE IMPAIRMENTS Abnormal gait, decreased activity tolerance, decreased balance, decreased endurance, decreased mobility, difficulty walking, decreased strength, and pain.   ACTIVITY LIMITATIONS carrying, lifting, bending, squatting, stairs, transfers, dressing, reach over head, and hygiene/grooming  PARTICIPATION LIMITATIONS: cleaning, laundry, shopping, community activity, and yard work  PERSONAL FACTORS Age, Fitness, Past/current experiences, and needing L TKA  are also affecting patient's functional outcome.   REHAB POTENTIAL: Good  CLINICAL DECISION MAKING: Stable/uncomplicated  EVALUATION COMPLEXITY: Low  PLAN: PT FREQUENCY: 2x/week  PT DURATION: 12 weeks  PLANNED INTERVENTIONS: Therapeutic exercises, Therapeutic activity, Neuromuscular re-education, Balance training, Gait training, Patient/Family  education, Self Care, Joint mobilization, DME instructions, Dry Needling, Moist heat, and Manual therapy  PLAN FOR NEXT SESSION:  *** Endurance and perform strengthening exercises necessary for improved balance.    JGwenlyn Saran PT, DPT Physical Therapist- [facility] 11/11/22, 9:32 AM

## 2022-11-13 ENCOUNTER — Encounter: Payer: Medicare HMO | Admitting: Speech Pathology

## 2022-11-13 ENCOUNTER — Ambulatory Visit: Payer: Medicare HMO | Admitting: Occupational Therapy

## 2022-11-13 ENCOUNTER — Ambulatory Visit: Payer: Medicare HMO | Attending: Physical Medicine and Rehabilitation

## 2022-11-13 DIAGNOSIS — R278 Other lack of coordination: Secondary | ICD-10-CM

## 2022-11-13 DIAGNOSIS — M6281 Muscle weakness (generalized): Secondary | ICD-10-CM

## 2022-11-13 DIAGNOSIS — R41841 Cognitive communication deficit: Secondary | ICD-10-CM | POA: Diagnosis not present

## 2022-11-13 DIAGNOSIS — I6381 Other cerebral infarction due to occlusion or stenosis of small artery: Secondary | ICD-10-CM | POA: Insufficient documentation

## 2022-11-13 DIAGNOSIS — R262 Difficulty in walking, not elsewhere classified: Secondary | ICD-10-CM | POA: Diagnosis not present

## 2022-11-13 NOTE — Therapy (Signed)
OUTPATIENT PHYSICAL THERAPY NEURO TREATMENT   Patient Name: [name] MRN: [id number] DOB:[birth year], 81 y.o., female Today's Date: 11/13/2022   PCP: Idelle Crouch, MD REFERRING PROVIDER: Bary Leriche, PA-C   PT End of Session - 11/13/22 1452     Visit Number 9    Number of Visits 24    Date for PT Re-Evaluation 12/16/22    Authorization Type [employer] - Needs Authorization    PT Start Time 1432    PT Stop Time 1515    PT Time Calculation (min) 43 min    Equipment Utilized During Treatment Gait belt    Activity Tolerance Patient tolerated treatment well    Behavior During Therapy WFL for tasks assessed/performed              Past Medical History:  Diagnosis Date   Anemia    Arthritis    Asthma    uses inhaler just prior to surgery to avoid attack   Back pain    from previous injury   Complication of anesthesia    has woken  up during 2 different surgery   Depression    no current issue/treatment; situation   Gallstones    GERD (gastroesophageal reflux disease)    Hiatal hernia    patient does NOT have nerve/muscle disease   History of kidney stones    HLD (hyperlipidemia)    HTN (hypertension)    Hypothyroidism    Kidney stones    Knee pain    Non-diabetic pancreatic hormone dysfunction years   pt. 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   Pancreatitis    Pneumonia    Seizures (HCC)    caused by dye injected during a procedure   Shortness of breath    with exertion   Sinus problem    frequent infections/congestion   Stroke ([place]) 2021   reports having CVA in 2021 and having mini strokes before that   Thyroid disease    Past Surgical History:  Procedure Laterality Date   ABDOMINAL HYSTERECTOMY     APPENDECTOMY     CARPAL TUNNEL RELEASE  10+ years ago   bilateral   EYE SURGERY  3 yrs ago   bilateral cataracts   FOOT OSTEOTOMY  6 weeks ago   Left foot: great, 2nd & 3rd   FOOT OSTEOTOMY  5 years ago   Right great toe   HAND  SURGERY Bilateral 2011-most recent   multiple hand surgeries, 2 on left, 3 on right   KNEE ARTHROPLASTY Right 04/28/2022   Procedure: COMPUTER ASSISTED TOTAL KNEE ARTHROPLASTY;  Surgeon: Dereck Leep, MD;  Location: [facility];  Service: Orthopedics;  Laterality: Right;   LOOP RECORDER INSERTION N/A 05/16/2020   Procedure: LOOP RECORDER INSERTION;  Surgeon: Isaias Cowman, MD;  Location: [facility];  Service: Cardiovascular;  Laterality: N/A;   NASAL SINUS SURGERY  most recent 7-8 yrs ago   7 sinus surgeries    TRIGGER FINGER RELEASE  11/19/2011   Procedure: RELEASE TRIGGER FINGER/A-1 PULLEY;  Surgeon: Wynonia Sours, MD;  Location: [facility];  Service: Orthopedics;  Laterality: Right;  release a-1 pulley right index finger and cyst removal   WRIST GANGLION EXCISION  1980's   right   Patient Active Problem List   Diagnosis Date Noted   Nausea and vomiting 10/15/2022   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   Fatigue 09/17/2022   Left hemiparesis ([place]) 09/17/2022   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  GERD (gastroesophageal reflux disease) 08/21/2022   Agitation 08/20/2022   Acute left-sided weakness 08/20/2022   Expressive aphasia    Stroke ([place]) 08/19/2022   Leukocytosis 08/19/2022   History of urticaria 04/28/2022   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   Primary osteoarthritis of left knee 02/24/2022   Primary osteoarthritis of right knee 02/24/2022   Lumbar spondylolysis 04/12/2020   History of CVA (cerebrovascular accident) 03/26/2020   Low back pain radiating to right lower extremity 03/21/2020   B12 deficiency 03/06/2020   Positive anti-CCP test 12/21/2019   Arthralgia 12/13/2019   Dermatitis 12/13/2019   Rheumatoid factor positive 12/13/2019   Essential hypertension 12/11/2018   Palpitations 12/11/2018   Acquired hypothyroidism 11/10/2018   Arthritis of knee 09/17/2016   Anxiety 11/22/2014   Asthma without status asthmaticus 11/22/2014    Benign neoplasm of colon, unspecified 11/22/2014   Environmental allergies 11/22/2014   Hypertriglyceridemia 11/22/2014   Hypokalemia 11/22/2014   Personal history of disease of skin and subcutaneous tissue 11/22/2014    ONSET DATE: 08/18/22  REFERRING DIAG: I63.81 (ICD-10-CM) - 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  Pt notes she barely had enough strength to get to the clinic today.  Pt accompanied by: self and significant other; [name].  PERTINENT HISTORY:  Patient is a 81 year old female who was diagnosed with a right thalamic CVA on 08/18/2022 with left-sided weakness.  Patient underwent inpatient rehabilitation for 2 weeks.  Patient was assessed and was scheduled for a knee replacement on 08/29/2022 however had to cancel it due to having had a CVA.  Patient had a recent fall 2 days after discharging from inpatient rehab. past medical history includes: Knee replacement, essential HTN, hypokalemia, leukocytosis, seizures, positive anti-- CCP test, anxiety disorder, mini strokes.  Patient had shingles with left eye nerve pain s/p 1 year ago.   PAIN:  Are you having pain? Yes, pt is having 5/10 pain in the L shoulder only.  No pain in the LE's.  PRECAUTIONS: None  WEIGHT BEARING RESTRICTIONS No  FALLS: Has patient fallen in last 6 months? Yes. Number of falls 3  LIVING ENVIRONMENT: Lives with: lives with their spouse Lives in: House/apartment Stairs: Yes: Internal: 12 steps; on left going up and External: 6 in  the back steps; can reach  both Has following equipment at home: Walker - 2 wheeled  PLOF: Independent  PATIENT GOALS Pt wants to get use of her shoulder and her legs back and improve balance.  OBJECTIVE:   DIAGNOSTIC FINDINGS:   FINDINGS: Brain: Acute infarct in the right corona radiata. Small acute infarct in the right anterior thalamus.   Ventricle size normal. Patchy white matter hyperintensity bilaterally compatible with chronic microvascular ischemia. Chronic infarcts in the thalamus bilaterally. Chronic infarcts in the lateral basal ganglia bilaterally. Negative for hemorrhage or mass.   Image quality degraded by motion   Vascular: Normal arterial flow voids.   Skull and upper cervical spine: No focal skeletal lesion. C1-2 arthropathy with pannus.   Sinuses/Orbits: Paranasal sinuses clear. Bilateral cataract extraction   Other: None   IMPRESSION: Acute infarct in the right corona radiata. Small acute infarct in the right anterior thalamus  COGNITION: Overall cognitive status: Within functional limits for tasks assessed   SENSATION: WFL; pt notes having a history of the hands having some numbness/tingling  COORDINATION: Pt unable to do quick coordination tests in the L UE.  Pt able to tolerate movement, but unable to do the movements quickly.  POSTURE: No Significant postural limitations   LOWER EXTREMITY MMT:    MMT Right Eval Left Eval  Hip flexion 4 4-  Hip extension 4 4-  Hip abduction 4 4-  Hip adduction 4 4-  Knee flexion 4 4-  Knee extension 4 4-  Ankle dorsiflexion 4 4-  (Blank rows = not tested)  FUNCTIONAL TESTs:  5 times sit to stand: 11.13 sec Timed up and go (TUG): 11.33 sec 6 minute walk test: Perform at evaluation 10 meter walk test: 12.18 sec  PATIENT SURVEYS:  ABC scale 53.75% FOTO 58; Predicted 68   TODAY'S TREATMENT:     TherEx:  Ambulation around the gym, x2 laps with no weights, 2 instances of instability mostly  with standing from seated position and staggering to the L.  Pt require seated rest break before completing the full 2nd lap.  Seated marches, x10 each LE with 4# AW donned Seated LAQ, x10 with 4# AW Seated heel raises, x10 with 4# AW donned Seated toe raises, x10 with 4# AW donned Seated adduction into rainbow physioball, 2x10 Seated step over hedgehogs, x10 with 4# AW donned     PATIENT EDUCATION: Education details: Pt educated on role of PT and services provided during current POC, along with prognosis and information about the clinic. Person educated: Patient and Spouse Education method: Explanation Education comprehension: verbalized understanding   HOME EXERCISE PROGRAM:  Access Code: [email or web address] URL: [email or web address] Date: 09/25/2022 Prepared by: Leland Nation  Exercises - Standing Hip Abduction with Counter Support  - 1 x daily - 7 x weekly - 3 sets - 10 reps - Standing Hip Extension with Counter Support  - 1 x daily - 7 x weekly - 3 sets - 10 reps - Standing Knee Flexion with Counter Support  - 1 x daily - 7 x weekly - 3 sets - 10 reps - Standing March with Unilateral Counter Support  - 1 x daily - 7 x weekly - 3 sets - 10 reps - Heel Toe Raises with Unilateral Counter Support  - 1 x daily - 7 x weekly - 3 sets - 10 reps    GOALS: Goals reviewed with patient? Yes  SHORT TERM GOALS: Target date: 12/11/2022  Pt will be independent with HEP in order to demonstrate increased ability to perform tasks  related to occupation/hobbies. Baseline: No HEP given at eval Goal status: INITIAL  LONG TERM GOALS: Target date: 02/05/2023  1.  Patient (> 41 years old) will complete five times sit to stand test in < 10 seconds indicating an increased LE strength and improved balance. Baseline: 11.13 Goal status: INITIAL  2.  Patient will increase FOTO score to equal to or greater than  68   to demonstrate statistically significant improvement in mobility and quality  of life.  Baseline: 58 Goal status: INITIAL   3.  Patient will increase ABC Balance scale by > 11% points to demonstrate decreased fall risk during functional activities. Baseline: 53.75% Goal status: INITIAL   4.  Patient will reduce timed up and go to <11 seconds to reduce fall risk and demonstrate improved transfer/gait ability. Baseline: 11.33 sec Goal status: INITIAL  5. Patient will increase 10 meter walk test to >1.11ms as to improve gait speed for better community ambulation and to reduce fall risk. Baseline: 0.82 m/se Goal status: INITIAL  6.  Patient will increase six minute walk test distance to >1000 for progression to community ambulator and improve gait ability Baseline: 600 ft but ended at 4:34 seconds in. Goal status: INITIAL    ASSESSMENT:  CLINICAL IMPRESSION: Pt continues to be noticeably weaker due to the sickness she has been dealing with over the last several weeks.  Pt notes today was the first time that she has been able to progress to getting out of the house this week.  Pt attempts to perform ambulation, however becomes SOB after 1 2/3 of a lap.  Pt then participated in seated level exercises but required rest breaks in between bouts of exercises.  Pt is to participate in progress note at next visit.   Pt will continue to benefit from skilled therapy to address remaining deficits in order to improve overall QoL and return to PLOF.         OBJECTIVE IMPAIRMENTS Abnormal gait, decreased activity tolerance, decreased balance, decreased endurance, decreased mobility, difficulty walking, decreased strength, and pain.   ACTIVITY LIMITATIONS carrying, lifting, bending, squatting, stairs, transfers, dressing, reach over head, and hygiene/grooming  PARTICIPATION LIMITATIONS: cleaning, laundry, shopping, community activity, and yard work  PERSONAL FACTORS Age, Fitness, Past/current experiences, and needing L TKA  are also affecting patient's functional outcome.    REHAB POTENTIAL: Good  CLINICAL DECISION MAKING: Stable/uncomplicated  EVALUATION COMPLEXITY: Low  PLAN: PT FREQUENCY: 2x/week  PT DURATION: 12 weeks  PLANNED INTERVENTIONS: Therapeutic exercises, Therapeutic activity, Neuromuscular re-education, Balance training, Gait training, Patient/Family education, Self Care, Joint mobilization, DME instructions, Dry Needling, Moist heat, and Manual therapy  PLAN FOR NEXT SESSION:  Progress note! Endurance and perform strengthening exercises necessary for improved balance.    JGwenlyn Saran PT, DPT [facility] 11/13/22, 2:53 PM

## 2022-11-13 NOTE — Therapy (Addendum)
Occupational Therapy Treatment Note  Patient Name: [name] MRN: [id number] DOB:07/30/1941, 81 y.o., female Today's Date: 11/04/2022  PCP: Dr. Doy Hutching REFERRING PROVIDER: Reesa Chew  OT End of Session - 11/13/22 1623     Visit Number 12    Number of Visits 57    Date for OT Re-Evaluation 12/02/22    Authorization Time Period Progress report period starting 09/09/2022    OT Start Time 1515    OT Stop Time 1600    OT Time Calculation (min) 45 min    Activity Tolerance Patient tolerated treatment well    Behavior During Therapy WFL for tasks assessed/performed               Past Medical History:  Diagnosis Date   Anemia    Arthritis    Asthma    uses inhaler just prior to surgery to avoid attack   Back pain    from previous injury   Complication of anesthesia    has woken  up during 2 different surgery   Depression    no current issue/treatment; situation   Gallstones    GERD (gastroesophageal reflux disease)    Hiatal hernia    patient does NOT have nerve/muscle disease   History of kidney stones    HLD (hyperlipidemia)    HTN (hypertension)    Hypothyroidism    Kidney stones    Knee pain    Non-diabetic pancreatic hormone dysfunction years   pt. 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   Pancreatitis    Pneumonia    Seizures (HCC)    caused by dye injected during a procedure   Shortness of breath    with exertion   Sinus problem    frequent infections/congestion   Stroke ([place]) 2021   reports having CVA in 2021 and having mini strokes before that   Thyroid disease    Past Surgical History:  Procedure Laterality Date   ABDOMINAL HYSTERECTOMY     APPENDECTOMY     CARPAL TUNNEL RELEASE  10+ years ago   bilateral   EYE SURGERY  3 yrs ago   bilateral cataracts   FOOT OSTEOTOMY  6 weeks ago   Left foot: great, 2nd & 3rd   FOOT OSTEOTOMY  5 years ago   Right great toe   HAND SURGERY Bilateral 2011-most recent   multiple hand surgeries, 2 on  left, 3 on right   KNEE ARTHROPLASTY Right 04/28/2022   Procedure: COMPUTER ASSISTED TOTAL KNEE ARTHROPLASTY;  Surgeon: Dereck Leep, MD;  Location: [facility];  Service: Orthopedics;  Laterality: Right;   LOOP RECORDER INSERTION N/A 05/16/2020   Procedure: LOOP RECORDER INSERTION;  Surgeon: Isaias Cowman, MD;  Location: [facility];  Service: Cardiovascular;  Laterality: N/A;   NASAL SINUS SURGERY  most recent 7-8 yrs ago   7 sinus surgeries    TRIGGER FINGER RELEASE  11/19/2011   Procedure: RELEASE TRIGGER FINGER/A-1 PULLEY;  Surgeon: Wynonia Sours, MD;  Location: [facility];  Service: Orthopedics;  Laterality: Right;  release a-1 pulley right index finger and cyst removal   WRIST GANGLION EXCISION  1980's   right   Patient Active Problem List   Diagnosis Date Noted   Nausea and vomiting 10/15/2022   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   Fatigue 09/17/2022   Left hemiparesis ([place]) 09/17/2022   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   GERD (gastroesophageal reflux disease) 08/21/2022   Agitation 08/20/2022   Acute left-sided  weakness 08/20/2022   Expressive aphasia    Stroke ([place]) 08/19/2022   Leukocytosis 08/19/2022   History of urticaria 04/28/2022   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   Primary osteoarthritis of left knee 02/24/2022   Primary osteoarthritis of right knee 02/24/2022   Lumbar spondylolysis 04/12/2020   History of CVA (cerebrovascular accident) 03/26/2020   Low back pain radiating to right lower extremity 03/21/2020   B12 deficiency 03/06/2020   Positive anti-CCP test 12/21/2019   Arthralgia 12/13/2019   Dermatitis 12/13/2019   Rheumatoid factor positive 12/13/2019   Essential hypertension 12/11/2018   Palpitations 12/11/2018   Acquired hypothyroidism 11/10/2018   Arthritis of knee 09/17/2016   Anxiety 11/22/2014   Asthma without status asthmaticus 11/22/2014   Benign neoplasm of colon, unspecified 11/22/2014    Environmental allergies 11/22/2014   Hypertriglyceridemia 11/22/2014   Hypokalemia 11/22/2014   Personal history of disease of skin and subcutaneous tissue 11/22/2014    ONSET DATE: 08/18/2022  REFERRING DIAG: CVA  THERAPY DIAG:  Muscle weakness (generalized)  Other lack of coordination  Rationale for Evaluation and Treatment Rehabilitation  SUBJECTIVE:   SUBJECTIVE STATEMENT:   Pt. Reports feeling better today, and was more talkative, and engaged. Pt. Reports that she thinks she was depressed, and is feeling better. Pt. Reports that she thought that she was not going to be able to get a puppy. But found out that it is likely she will.   Pt accompanied by: significant other  PERTINENT HISTORY: Patient is a 81 year old female who was diagnosed with a right thalamic CVA on 08/18/2022 with left-sided weakness.  Patient underwent inpatient rehabilitation for 2 weeks. Patient was assessed and was scheduled for a knee replacement on 08/29/2022 however had to cancel it due to having had a CVA.  Patient had a recent fall 2 days after discharging from inpatient rehab. past medical history includes: Knee replacement, essential HTN, hypokalemia, leukocytosis, seizures, positive anti-- CCP test, anxiety disorder, mini strokes.  Patient had shingles with left eye nerve pain s/p 1 year ago.   PRECAUTIONS: Fall  WEIGHT BEARING RESTRICTIONS No  PAIN:  Are you having pain? Yes, 0/10 left shoulder.  Pt. Reports chronic left shoulder pain due to decreased cartilage in the joint space. Pt. Reports that it started when she was in her 20's.  FALLS: Has patient fallen in last 6 months? Yes. Number of falls 1  LIVING ENVIRONMENT: Lives with: Lives with Spouse Lives in: House/apartment Stairs: 2 storey home, resides on the first floor.  External: 2 stairs front no rails, and 6 in back with rails Has following equipment at home: Single point cane, Walker - 2 wheeled, Environmental consultant - 4 wheeled, Shower bench, and  bed side commode  PLOF: Independent  PATIENT GOALS  Pt. Regain the use of her left arm  OBJECTIVE:   HAND DOMINANCE: Right  ADLs: Overall ADLs: Husband assists pt. as needed Transfers/ambulation related to ADLs:Pt. Has assistive devices. Eating: Pt. Is independent with the right hand. Pt. Is unable to cut food, and open packets Grooming: Pt. Is using her right hand, however has difficulty using her left to assist with haircare UB Dressing: Pt. Is independent donning a pullover shirt, and button down shirt. Has difficulty with buttoning, LB Dressing:  Independent donning pants, and socks. Difficulty tying shoes. Toileting: Independent Bathing: independent using the right hand, difficulty using the LUE for the right side. Tub Shower transfers: Supervision Equipment: See above for equipment   IADLs: Shopping:  Has not had the opportunity  for grocery shopping yet Light housekeeping: Husband assisting with light house keeping Meal Prep:  Dependent Community mobility: Relies of family/friends Medication management: Husband Environmental manager management: TBD Handwriting: 75% legible  MOBILITY STATUS: Hx of falls  POSTURE COMMENTS:  No Significant postural limitations Sitting balance: supported sitting balance WFL  ACTIVITY TOLERANCE: Activity tolerance:  Fatigues with 10-15 min.  FUNCTIONAL OUTCOME MEASURES: FOTO: 45  UPPER EXTREMITY ROM     Active ROM Right Eval: WFL Left eval Left 10/23/22  Shoulder flexion  55(117) 135(153)  Shoulder abduction  67(108 140  Shoulder adduction     Shoulder extension     Shoulder internal rotation     Shoulder external rotation     Elbow flexion  122(144) 143  Elbow extension  -16(0) 0  Wrist flexion  62 62  Wrist extension  -22(50) 28(64)  Wrist ulnar deviation     Wrist radial deviation     Wrist pronation     Wrist supination     (Blank rows = not tested)  Left digit flexion to [facility]: 2nd: 2cm, 3rd: 0cm, 4th: 0cm, 5th:  0cm   Eval: Limited Left full digit extension  10/23/22: Limited left 2nd digit extension. Full 3rd, 4th, and 5th digit extension.    UPPER EXTREMITY MMT:     MMT Right Eval: 4+/5 overall Left eval Left  10/23/2022  Shoulder flexion  3-/5 3+/5  Shoulder abduction  3-/5 3/5  Shoulder adduction     Shoulder extension     Shoulder internal rotation     Shoulder external rotation     Middle trapezius     Lower trapezius     Elbow flexion  3+/5 4+/5  Elbow extension  3+/5 4+/5  Wrist flexion     Wrist extension  2-/5 2+/5  Wrist ulnar deviation     Wrist radial deviation     Wrist pronation     Wrist supination     (Blank rows = not tested)  HAND FUNCTION: Eval: Left Unable  10/23/2022: Right TBA Grip strength; R:  L: 13#, Pinch strength: R:  L: 3#, 3Pt. Pinch strength: R:  L: 2#  COORDINATION: Eval: Impaired. Unable to perform 9 hole pegs test. 10/23/2022: 9 Hole Peg Test: R:    L: 5 pegs placed in 3 min. & 16 sec.   SENSATION: Light touch: WFL, proprioceptive awareness: Intact  EDEMA: N/A  MUSCLE TONE: LUE: Hypotonic  COGNITION: Overall cognitive status: Within functional limits for tasks assessed Pt. reports memory limitations.  Patient has bilateral hearing aids however was not wearing them during the initial evaluation.  VISION: Subjective report: Pt. report having shingles affecting left eye  s/p 1 year. Has nerve pain Baseline vision: Wears glasses for reading only Visual history: Report no changes  VISION ASSESSMENT:   WFL for tasks performed  PERCEPTION: Intact  PRAXIS: Impaired: Motor planning  OBSERVATIONS: Patient reports being sore following a recent fall after discharging from inpatient rehab.   TODAY'S TREATMENT:      There. Ex.:   Pt. performed reps digit extension, and thumb IP extension with her hand positioned flat at the tabletop surface. Pt. performed lateral pinch exercises with yellow, and red resistive clips, and 3pt.  pinch with red resistive clips. Pt. worked on reaching up to place them onto a horizontal dowel rod. Pt. continues to present with limited BUE strength, and [place] skills in order to work towards improving, and maximizing independence with ADLs, and IADL tasks.  Neuromuscular re-education:   Pt. worked on grasping 1/2", 3/4", and 1" washers from a resistive dish and placing them on the flat tabletop surface. Emphasis was placed on thumb IP extension during the task.  Pt. presents with less tightness in the thumb IP flexors today. Pt. was able to actively extend thumb IP more today in preparation for grasping the washers more consistently. Pt. Continues to present with flexor tone in the thumb IP, however was notably less today. Patient continues to work on improving left upper extremity strength, wrist and digit extension, and overall LUE functioning in order to improve engagement in, and maximize independence in ADLs and IADL tasks.      PATIENT EDUCATION: Education details: OT services, POC, and goals. Person educated: Patient and Spouse Education method: Customer service manager Education comprehension: verbalized understanding, returned demonstration, and needs further education   HOME EXERCISE PROGRAM:  Assess ongoing need for HEP    GOALS: Goals reviewed with patient? Yes  SHORT TERM GOALS: Target date: 12/02/2022   Patient will be independent with home exercise program for the left upper extremity Baseline: 10/23/22: Pt. Is independent with Home exercise recommendations provided to date.  Eval: No current home exercise program Goal status: Ongoing  2.  Patient will independently formulate a full composite fist with the left hand.  Baseline:12/23/2021: Pt. Is able to formulate a full composite fist with the left hand. Eval: Second digit to the [facility]: 2cm.  Goal status: Met              3: Pt. will improve active digit extension to be able to place her hand flat tabletop  surface in preparation for weightbearing proprioceptive input. Baseline: 10/23/2022: Pt. is able to achieve full 3rd, 4th, and 5th digit extension. Pt. presents with limited 2nd digit extension. Eval: Pt. Is unable to actively full digit extension Goal status:  Partially met   LONG TERM GOALS: Target date: 12/02/2022  Patient will improve active left shoulder flexion by 20 degrees in preparation for reaching for the refrigerator door. Baseline: 10/23/2022: 135(153) Eval: Left shoulder flexion 55(117) Goal status: Met  2.  Patient will improve left shoulder active abduction to be able to comb her hair Baseline: 10/23/2022: shoulder abduction: 140. Pt. Has difficulty sustaining UEs in elevation while performing hair care. Eval: Left shoulder abduction is 67(108) Goal status: Partially Met; advised see below  3.  Patient will improve left hand [facility] skills in order to be able to button her shirt with modified independence. Baseline: 10/23/2022:  Left [place] on 9 hole peg test: 5 pegs placed in 51mn. &16 sec. Pt. Continues to have difficulty completing buttoning efficiently.Eval: Patient requires increased time to complete Goal status: Revised  4.  Patient well improve left grip strength preparation for cutting food Baseline: 10/23/2022: Left grip strength: 13# Eval: Pt. was unable to formulate a grip to hold items Goal status: Ongoing  5.  Pt. will independently recall adaptive  strategies for performing ADL tasks including: flossing teeth, donning bra, applying makeup. Baseline: 10/23/2022: Pt. Is starting to engage her left hand during more daily tasks. Eval: Pt. unable to perform Goal status: Ongoing  6.  Pt. will improve FOTO score by 5 points to reflect patient perceived performance improvement assessment specific ADLs  and IADLs Baseline: 10/23/2022: 54 Eval: 45 Goal status: ongoing  2.  Patient will improve left shoulder strength in order to be able to sustain UEs in elevation while  performing hair care.  Baseline: 10/23/2022: Shoulder flexion:  3+/5, abduction: 3/5 Goal status: New     ASSESSMENT:  CLINICAL IMPRESSION:    Pt. presents with less tightness in the thumb IP flexors today. Pt. was able to actively extend thumb IP more today in preparation for grasping the washers more consistently. Pt. Continues to present with flexor tone in the thumb IP, however was notably less today. Patient continues to work on improving left upper extremity strength, wrist and digit extension, and overall LUE functioning in order to improve engagement in, and maximize independence in ADLs and IADL tasks.  PERFORMANCE DEFICITS in functional skills including ADLs, IADLs, coordination, proprioception, ROM, strength, Wallace, and GMC, cognitive skills including memory, and psychosocial skills including coping strategies, environmental adaptation, interpersonal interactions, and routines and behaviors.   IMPAIRMENTS are limiting patient from ADLs, IADLs, education, leisure, and social participation.   COMORBIDITIES may have co-morbidities  that affects occupational performance. Patient will benefit from skilled OT to address above impairments and improve overall function.  MODIFICATION OR ASSISTANCE TO COMPLETE EVALUATION: Min-Moderate modification of tasks or assist with assess necessary to complete an evaluation.  OT OCCUPATIONAL PROFILE AND HISTORY: Detailed assessment: Review of records and additional review of physical, cognitive, psychosocial history related to current functional performance.  CLINICAL DECISION MAKING: Moderate - several treatment options, min-mod task modification necessary  REHAB POTENTIAL: Good  EVALUATION COMPLEXITY: Moderate    PLAN: OT FREQUENCY: 3x/week  OT DURATION: 12 weeks  PLANNED INTERVENTIONS: self care/ADL training, therapeutic exercise, therapeutic activity, neuromuscular re-education, manual therapy, passive range of motion, functional mobility  training, electrical stimulation, and paraffin  RECOMMENDED OTHER SERVICES: PT  CONSULTED AND AGREED WITH PLAN OF CARE: Patient and family member/caregiver  PLAN FOR NEXT SESSION: Initiate OT treatment  Harrel Carina, MS, OTR/L  11/04/2022, 5:56 PM

## 2022-11-18 ENCOUNTER — Ambulatory Visit: Payer: Medicare HMO | Admitting: Occupational Therapy

## 2022-11-18 ENCOUNTER — Ambulatory Visit: Payer: Medicare HMO

## 2022-11-18 DIAGNOSIS — I6381 Other cerebral infarction due to occlusion or stenosis of small artery: Secondary | ICD-10-CM | POA: Diagnosis not present

## 2022-11-18 DIAGNOSIS — R262 Difficulty in walking, not elsewhere classified: Secondary | ICD-10-CM | POA: Diagnosis not present

## 2022-11-18 DIAGNOSIS — R278 Other lack of coordination: Secondary | ICD-10-CM

## 2022-11-18 DIAGNOSIS — R41841 Cognitive communication deficit: Secondary | ICD-10-CM

## 2022-11-18 DIAGNOSIS — M6281 Muscle weakness (generalized): Secondary | ICD-10-CM

## 2022-11-18 NOTE — Therapy (Signed)
Occupational Therapy Treatment Note  Patient Name: [name] MRN: [id number] DOB:23-Apr-1941, 81 y.o., female Today's Date: 11/18/2022  PCP: Dr. Doy Hutching REFERRING PROVIDER: Reesa Chew  OT End of Session - 11/18/22 1642     Visit Number 13    Number of Visits 25    Date for OT Re-Evaluation 12/02/22    Authorization Time Period Progress report period starting 09/09/2022    OT Start Time 1643    OT Stop Time 1728    OT Time Calculation (min) 45 min    Activity Tolerance Patient tolerated treatment well    Behavior During Therapy WFL for tasks assessed/performed               Past Medical History:  Diagnosis Date   Anemia    Arthritis    Asthma    uses inhaler just prior to surgery to avoid attack   Back pain    from previous injury   Complication of anesthesia    has woken  up during 2 different surgery   Depression    no current issue/treatment; situation   Gallstones    GERD (gastroesophageal reflux disease)    Hiatal hernia    patient does NOT have nerve/muscle disease   History of kidney stones    HLD (hyperlipidemia)    HTN (hypertension)    Hypothyroidism    Kidney stones    Knee pain    Non-diabetic pancreatic hormone dysfunction years   pt. 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   Pancreatitis    Pneumonia    Seizures (HCC)    caused by dye injected during a procedure   Shortness of breath    with exertion   Sinus problem    frequent infections/congestion   Stroke ([place]) 2021   reports having CVA in 2021 and having mini strokes before that   Thyroid disease    Past Surgical History:  Procedure Laterality Date   ABDOMINAL HYSTERECTOMY     APPENDECTOMY     CARPAL TUNNEL RELEASE  10+ years ago   bilateral   EYE SURGERY  3 yrs ago   bilateral cataracts   FOOT OSTEOTOMY  6 weeks ago   Left foot: great, 2nd & 3rd   FOOT OSTEOTOMY  5 years ago   Right great toe   HAND SURGERY Bilateral 2011-most recent   multiple hand surgeries, 2 on  left, 3 on right   KNEE ARTHROPLASTY Right 04/28/2022   Procedure: COMPUTER ASSISTED TOTAL KNEE ARTHROPLASTY;  Surgeon: Dereck Leep, MD;  Location: [facility];  Service: Orthopedics;  Laterality: Right;   LOOP RECORDER INSERTION N/A 05/16/2020   Procedure: LOOP RECORDER INSERTION;  Surgeon: Isaias Cowman, MD;  Location: [facility];  Service: Cardiovascular;  Laterality: N/A;   NASAL SINUS SURGERY  most recent 7-8 yrs ago   7 sinus surgeries    TRIGGER FINGER RELEASE  11/19/2011   Procedure: RELEASE TRIGGER FINGER/A-1 PULLEY;  Surgeon: Wynonia Sours, MD;  Location: [facility];  Service: Orthopedics;  Laterality: Right;  release a-1 pulley right index finger and cyst removal   WRIST GANGLION EXCISION  1980's   right   Patient Active Problem List   Diagnosis Date Noted   Nausea and vomiting 10/15/2022   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   Fatigue 09/17/2022   Left hemiparesis ([place]) 09/17/2022   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   GERD (gastroesophageal reflux disease) 08/21/2022   Agitation 08/20/2022   Acute left-sided  weakness 08/20/2022   Expressive aphasia    Stroke ([place]) 08/19/2022   Leukocytosis 08/19/2022   History of urticaria 04/28/2022   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   Primary osteoarthritis of left knee 02/24/2022   Primary osteoarthritis of right knee 02/24/2022   Lumbar spondylolysis 04/12/2020   History of CVA (cerebrovascular accident) 03/26/2020   Low back pain radiating to right lower extremity 03/21/2020   B12 deficiency 03/06/2020   Positive anti-CCP test 12/21/2019   Arthralgia 12/13/2019   Dermatitis 12/13/2019   Rheumatoid factor positive 12/13/2019   Essential hypertension 12/11/2018   Palpitations 12/11/2018   Acquired hypothyroidism 11/10/2018   Arthritis of knee 09/17/2016   Anxiety 11/22/2014   Asthma without status asthmaticus 11/22/2014   Benign neoplasm of colon, unspecified 11/22/2014    Environmental allergies 11/22/2014   Hypertriglyceridemia 11/22/2014   Hypokalemia 11/22/2014   Personal history of disease of skin and subcutaneous tissue 11/22/2014    ONSET DATE: 08/18/2022  REFERRING DIAG: CVA  THERAPY DIAG:  Muscle weakness (generalized)  Other lack of coordination  Rationale for Evaluation and Treatment Rehabilitation  SUBJECTIVE:   SUBJECTIVE STATEMENT:   Pt. Reports feeling better today, and was more talkative, and engaged. Pt. Reports that she thinks she was depressed, and is feeling better. Pt. Reports that she thought that she was not going to be able to get a puppy. But found out that it is likely she will.   Pt accompanied by: significant other  PERTINENT HISTORY: Patient is a 81 year old female who was diagnosed with a right thalamic CVA on 08/18/2022 with left-sided weakness.  Patient underwent inpatient rehabilitation for 2 weeks. Patient was assessed and was scheduled for a knee replacement on 08/29/2022 however had to cancel it due to having had a CVA.  Patient had a recent fall 2 days after discharging from inpatient rehab. past medical history includes: Knee replacement, essential HTN, hypokalemia, leukocytosis, seizures, positive anti-- CCP test, anxiety disorder, mini strokes.  Patient had shingles with left eye nerve pain s/p 1 year ago.   PRECAUTIONS: Fall  WEIGHT BEARING RESTRICTIONS No  PAIN:  Are you having pain? Yes, 0/10 left shoulder.  Pt. Reports chronic left shoulder pain due to decreased cartilage in the joint space. Pt. Reports that it started when she was in her 14's.  FALLS: Has patient fallen in last 6 months? Yes. Number of falls 1  LIVING ENVIRONMENT: Lives with: Lives with Spouse Lives in: House/apartment Stairs: 2 storey home, resides on the first floor.  External: 2 stairs front no rails, and 6 in back with rails Has following equipment at home: Single point cane, Walker - 2 wheeled, Environmental consultant - 4 wheeled, Shower bench, and  bed side commode  PLOF: Independent  PATIENT GOALS  Pt. Regain the use of her left arm  OBJECTIVE:   HAND DOMINANCE: Right  ADLs: Overall ADLs: Husband assists pt. as needed Transfers/ambulation related to ADLs:Pt. Has assistive devices. Eating: Pt. Is independent with the right hand. Pt. Is unable to cut food, and open packets Grooming: Pt. Is using her right hand, however has difficulty using her left to assist with haircare UB Dressing: Pt. Is independent donning a pullover shirt, and button down shirt. Has difficulty with buttoning, LB Dressing:  Independent donning pants, and socks. Difficulty tying shoes. Toileting: Independent Bathing: independent using the right hand, difficulty using the LUE for the right side. Tub Shower transfers: Supervision Equipment: See above for equipment   IADLs: Shopping:  Has not had the opportunity  for grocery shopping yet Light housekeeping: Husband assisting with light house keeping Meal Prep:  Dependent Community mobility: Relies of family/friends Medication management: Husband Environmental manager management: TBD Handwriting: 75% legible  MOBILITY STATUS: Hx of falls  POSTURE COMMENTS:  No Significant postural limitations Sitting balance: supported sitting balance WFL  ACTIVITY TOLERANCE: Activity tolerance:  Fatigues with 10-15 min.  FUNCTIONAL OUTCOME MEASURES: FOTO: 45  UPPER EXTREMITY ROM     Active ROM Right Eval: WFL Left eval Left 10/23/22  Shoulder flexion  55(117) 135(153)  Shoulder abduction  67(108 140  Shoulder adduction     Shoulder extension     Shoulder internal rotation     Shoulder external rotation     Elbow flexion  122(144) 143  Elbow extension  -16(0) 0  Wrist flexion  62 62  Wrist extension  -22(50) 28(64)  Wrist ulnar deviation     Wrist radial deviation     Wrist pronation     Wrist supination     (Blank rows = not tested)  Left digit flexion to [facility]: 2nd: 2cm, 3rd: 0cm, 4th: 0cm, 5th:  0cm   Eval: Limited Left full digit extension  10/23/22: Limited left 2nd digit extension. Full 3rd, 4th, and 5th digit extension.    UPPER EXTREMITY MMT:     MMT Right Eval: 4+/5 overall Left eval Left  10/23/2022  Shoulder flexion  3-/5 3+/5  Shoulder abduction  3-/5 3/5  Shoulder adduction     Shoulder extension     Shoulder internal rotation     Shoulder external rotation     Middle trapezius     Lower trapezius     Elbow flexion  3+/5 4+/5  Elbow extension  3+/5 4+/5  Wrist flexion     Wrist extension  2-/5 2+/5  Wrist ulnar deviation     Wrist radial deviation     Wrist pronation     Wrist supination     (Blank rows = not tested)  HAND FUNCTION: Eval: Left Unable  10/23/2022: Right TBA Grip strength; R:  L: 13#, Pinch strength: R:  L: 3#, 3Pt. Pinch strength: R:  L: 2#  COORDINATION: Eval: Impaired. Unable to perform 9 hole pegs test. 10/23/2022: 9 Hole Peg Test: R:    L: 5 pegs placed in 3 min. & 16 sec.   SENSATION: Light touch: WFL, proprioceptive awareness: Intact  EDEMA: N/A  MUSCLE TONE: LUE: Hypotonic  COGNITION: Overall cognitive status: Within functional limits for tasks assessed Pt. reports memory limitations.  Patient has bilateral hearing aids however was not wearing them during the initial evaluation.  VISION: Subjective report: Pt. report having shingles affecting left eye  s/p 1 year. Has nerve pain Baseline vision: Wears glasses for reading only Visual history: Report no changes  VISION ASSESSMENT:   WFL for tasks performed  PERCEPTION: Intact  PRAXIS: Impaired: Motor planning  OBSERVATIONS: Patient reports being sore following a recent fall after discharging from inpatient rehab.   TODAY'S TREATMENT:      There. Ex.:   Pt. tolerated AAROM, and PROM for left shoulder flexion, and abduction. Pt. performed reps digit extension, and thumb IP extension with her hand positioned flat at the tabletop surface. Pt. worked on  thumb opposition to the tip of her 2nd through 5th digits.    Neuromuscular re-education:   Pt. worked on grasping 1" cubes, isometric holding between the 2nd digit, and thumb. Pt. worked on active releasing the cubes onto the resistive board, and isolating  the 2nd digit and pressing them into place.   Pt. Reports being sleepy today, and was more fatigued after driving around and eating, but was glad she came to therapy. Pt. continues to present with less tightness in the thumb IP flexors today with more active motion when preparing to grasp items. Pt. Continues to be able to actively extend thumb IP, and 2nd digit more today in preparation for grasping the cubes with more accuracy. Patient continues to work on improving left upper extremity strength, wrist and digit extension, and overall LUE functioning in order to improve engagement in, and maximize independence in ADLs and IADL tasks.      PATIENT EDUCATION: Education details: OT services, POC, and goals. Person educated: Patient and Spouse Education method: Customer service manager Education comprehension: verbalized understanding, returned demonstration, and needs further education   HOME EXERCISE PROGRAM:  Assess ongoing need for HEP    GOALS: Goals reviewed with patient? Yes  SHORT TERM GOALS: Target date: 12/02/2022   Patient will be independent with home exercise program for the left upper extremity Baseline: 10/23/22: Pt. Is independent with Home exercise recommendations provided to date.  Eval: No current home exercise program Goal status: Ongoing  2.  Patient will independently formulate a full composite fist with the left hand.  Baseline:12/23/2021: Pt. Is able to formulate a full composite fist with the left hand. Eval: Second digit to the [facility]: 2cm.  Goal status: Met              3: Pt. will improve active digit extension to be able to place her hand flat tabletop surface in preparation for weightbearing  proprioceptive input. Baseline: 10/23/2022: Pt. is able to achieve full 3rd, 4th, and 5th digit extension. Pt. presents with limited 2nd digit extension. Eval: Pt. Is unable to actively full digit extension Goal status:  Partially met   LONG TERM GOALS: Target date: 12/02/2022  Patient will improve active left shoulder flexion by 20 degrees in preparation for reaching for the refrigerator door. Baseline: 10/23/2022: 135(153) Eval: Left shoulder flexion 55(117) Goal status: Met  2.  Patient will improve left shoulder active abduction to be able to comb her hair Baseline: 10/23/2022: shoulder abduction: 140. Pt. Has difficulty sustaining UEs in elevation while performing hair care. Eval: Left shoulder abduction is 67(108) Goal status: Partially Met; advised see below  3.  Patient will improve left hand [facility] skills in order to be able to button her shirt with modified independence. Baseline: 10/23/2022:  Left [place] on 9 hole peg test: 5 pegs placed in 59mn. &16 sec. Pt. Continues to have difficulty completing buttoning efficiently.Eval: Patient requires increased time to complete Goal status: Revised  4.  Patient well improve left grip strength preparation for cutting food Baseline: 10/23/2022: Left grip strength: 13# Eval: Pt. was unable to formulate a grip to hold items Goal status: Ongoing  5.  Pt. will independently recall adaptive  strategies for performing ADL tasks including: flossing teeth, donning bra, applying makeup. Baseline: 10/23/2022: Pt. Is starting to engage her left hand during more daily tasks. Eval: Pt. unable to perform Goal status: Ongoing  6.  Pt. will improve FOTO score by 5 points to reflect patient perceived performance improvement assessment specific ADLs  and IADLs Baseline: 10/23/2022: 54 Eval: 45 Goal status: ongoing  2.  Patient will improve left shoulder strength in order to be able to sustain UEs in elevation while performing hair care.  Baseline:  10/23/2022: Shoulder flexion: 3+/5, abduction: 3/5 Goal  status: New     ASSESSMENT:  CLINICAL IMPRESSION:    Pt. Reports being sleepy today, and was more fatigued after driving around and eating, but was glad she came to therapy. Pt. continues to present with less tightness in the thumb IP flexors today with more active motion when preparing to grasp items. Pt. Continues to be able to actively extend thumb IP, and 2nd digit more today in preparation for grasping the cubes with more accuracy. Patient continues to work on improving left upper extremity strength, wrist and digit extension, and overall LUE functioning in order to improve engagement in, and maximize independence in ADLs and IADL tasks.  Marland Kitchen  PERFORMANCE DEFICITS in functional skills including ADLs, IADLs, coordination, proprioception, ROM, strength, [place], and GMC, cognitive skills including memory, and psychosocial skills including coping strategies, environmental adaptation, interpersonal interactions, and routines and behaviors.   IMPAIRMENTS are limiting patient from ADLs, IADLs, education, leisure, and social participation.   COMORBIDITIES may have co-morbidities  that affects occupational performance. Patient will benefit from skilled OT to address above impairments and improve overall function.  MODIFICATION OR ASSISTANCE TO COMPLETE EVALUATION: Min-Moderate modification of tasks or assist with assess necessary to complete an evaluation.  OT OCCUPATIONAL PROFILE AND HISTORY: Detailed assessment: Review of records and additional review of physical, cognitive, psychosocial history related to current functional performance.  CLINICAL DECISION MAKING: Moderate - several treatment options, min-mod task modification necessary  REHAB POTENTIAL: Good  EVALUATION COMPLEXITY: Moderate    PLAN: OT FREQUENCY: 3x/week  OT DURATION: 12 weeks  PLANNED INTERVENTIONS: self care/ADL training, therapeutic exercise, therapeutic  activity, neuromuscular re-education, manual therapy, passive range of motion, functional mobility training, electrical stimulation, and paraffin  RECOMMENDED OTHER SERVICES: PT  CONSULTED AND AGREED WITH PLAN OF CARE: Patient and family member/caregiver  PLAN FOR NEXT SESSION: Initiate OT treatment  Harrel Carina, MS, OTR/L  11/18/2022, 4:45 PM

## 2022-11-18 NOTE — Therapy (Unsigned)
OUTPATIENT PHYSICAL THERAPY NEURO TREATMENT/PHYSICAL THERAPY PROGRESS NOTE   Dates of reporting period  09/23/22   to   11/18/22    Patient Name: [name] MRN: [id number] DOB:[birth year], 81 y.o., female Today's Date: 11/18/2022   PCP: Idelle Crouch, MD REFERRING PROVIDER: Bary Leriche, PA-C   PT End of Session - 11/18/22 1607     Visit Number 10    Number of Visits 24    Date for PT Re-Evaluation 12/16/22    Authorization Type [employer] - Needs Authorization    PT Start Time 1600    PT Stop Time 1645    PT Time Calculation (min) 45 min    Equipment Utilized During Treatment Gait belt    Activity Tolerance Patient tolerated treatment well    Behavior During Therapy WFL for tasks assessed/performed              Past Medical History:  Diagnosis Date   Anemia    Arthritis    Asthma    uses inhaler just prior to surgery to avoid attack   Back pain    from previous injury   Complication of anesthesia    has woken  up during 2 different surgery   Depression    no current issue/treatment; situation   Gallstones    GERD (gastroesophageal reflux disease)    Hiatal hernia    patient does NOT have nerve/muscle disease   History of kidney stones    HLD (hyperlipidemia)    HTN (hypertension)    Hypothyroidism    Kidney stones    Knee pain    Non-diabetic pancreatic hormone dysfunction years   pt. 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   Pancreatitis    Pneumonia    Seizures (HCC)    caused by dye injected during a procedure   Shortness of breath    with exertion   Sinus problem    frequent infections/congestion   Stroke ([place]) 2021   reports having CVA in 2021 and having mini strokes before that   Thyroid disease    Past Surgical History:  Procedure Laterality Date   ABDOMINAL HYSTERECTOMY     APPENDECTOMY     CARPAL TUNNEL RELEASE  10+ years ago   bilateral   EYE SURGERY  3 yrs ago   bilateral cataracts   FOOT OSTEOTOMY  6 weeks ago    Left foot: great, 2nd & 3rd   FOOT OSTEOTOMY  5 years ago   Right great toe   HAND SURGERY Bilateral 2011-most recent   multiple hand surgeries, 2 on left, 3 on right   KNEE ARTHROPLASTY Right 04/28/2022   Procedure: COMPUTER ASSISTED TOTAL KNEE ARTHROPLASTY;  Surgeon: Dereck Leep, MD;  Location: [facility];  Service: Orthopedics;  Laterality: Right;   LOOP RECORDER INSERTION N/A 05/16/2020   Procedure: LOOP RECORDER INSERTION;  Surgeon: Isaias Cowman, MD;  Location: [facility];  Service: Cardiovascular;  Laterality: N/A;   NASAL SINUS SURGERY  most recent 7-8 yrs ago   7 sinus surgeries    TRIGGER FINGER RELEASE  11/19/2011   Procedure: RELEASE TRIGGER FINGER/A-1 PULLEY;  Surgeon: Wynonia Sours, MD;  Location: [facility];  Service: Orthopedics;  Laterality: Right;  release a-1 pulley right index finger and cyst removal   WRIST GANGLION EXCISION  1980's   right   Patient Active Problem List   Diagnosis Date Noted   Nausea and vomiting 10/15/2022   Post herpetic neuralgia  10/15/2022   Fatigue 09/17/2022   Left hemiparesis ([place]) 09/17/2022   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   GERD (gastroesophageal reflux disease) 08/21/2022   Agitation 08/20/2022   Acute left-sided weakness 08/20/2022   Expressive aphasia    Stroke ([place]) 08/19/2022   Leukocytosis 08/19/2022   History of urticaria 04/28/2022   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   Primary osteoarthritis of left knee 02/24/2022   Primary osteoarthritis of right knee 02/24/2022   Lumbar spondylolysis 04/12/2020   History of CVA (cerebrovascular accident) 03/26/2020   Low back pain radiating to right lower extremity 03/21/2020   B12 deficiency 03/06/2020   Positive anti-CCP test 12/21/2019   Arthralgia 12/13/2019   Dermatitis 12/13/2019   Rheumatoid factor positive 12/13/2019   Essential hypertension 12/11/2018   Palpitations 12/11/2018   Acquired hypothyroidism 11/10/2018   Arthritis  of knee 09/17/2016   Anxiety 11/22/2014   Asthma without status asthmaticus 11/22/2014   Benign neoplasm of colon, unspecified 11/22/2014   Environmental allergies 11/22/2014   Hypertriglyceridemia 11/22/2014   Hypokalemia 11/22/2014   Personal history of disease of skin and subcutaneous tissue 11/22/2014    ONSET DATE: 08/18/22  REFERRING DIAG: I63.81 (ICD-10-CM) - 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  Pt noted to not have been able to sleep well last night.  Pt accompanied by: self and significant other; [name].  PERTINENT HISTORY:   Patient is a 81 year old female who was diagnosed with a right thalamic CVA on 08/18/2022 with left-sided weakness.  Patient underwent inpatient rehabilitation for 2 weeks.  Patient was assessed and was scheduled for a knee replacement on 08/29/2022 however had to cancel it due to having had a CVA.  Patient had a recent fall 2 days after discharging from inpatient rehab. past medical history includes: Knee replacement, essential HTN, hypokalemia, leukocytosis, seizures, positive anti-- CCP test, anxiety disorder, mini strokes.  Patient had shingles with left eye nerve pain s/p 1 year ago.   PAIN:  Are you having pain? Yes, pt is having 2/10 pain in the neck.  No pain in the LE's.  PRECAUTIONS: None  WEIGHT BEARING RESTRICTIONS No  FALLS: Has patient fallen in last 6 months? Yes. Number of falls  3  LIVING  ENVIRONMENT: Lives with: lives with their spouse Lives in: House/apartment Stairs: Yes: Internal: 12 steps; on left going up and External: 6 in the back steps; can reach both Has following equipment at home: Walker - 2 wheeled  PLOF: [place] Pt wants to get use of her shoulder and her legs back and improve balance.  OBJECTIVE:   DIAGNOSTIC FINDINGS:   FINDINGS: Brain: Acute infarct in the right corona radiata. Small acute infarct in the right anterior thalamus.   Ventricle size normal. Patchy white matter hyperintensity bilaterally compatible with chronic microvascular ischemia. Chronic infarcts in the thalamus bilaterally. Chronic infarcts in the lateral basal ganglia bilaterally. Negative for hemorrhage or mass.   Image quality degraded by motion   Vascular: Normal arterial flow voids.   Skull and upper cervical spine: No focal skeletal lesion. C1-2 arthropathy with pannus.   Sinuses/Orbits: Paranasal sinuses clear. Bilateral cataract extraction   Other: None   IMPRESSION: Acute infarct in the right corona radiata. Small acute infarct in the right anterior thalamus  COGNITION: Overall cognitive status: Within functional limits for tasks assessed   SENSATION: WFL; pt notes having a history of the hands having some numbness/tingling  COORDINATION: Pt unable to do quick coordination tests in the L UE.  Pt able to tolerate movement, but unable to do the movements quickly.  POSTURE: No Significant postural limitations   LOWER EXTREMITY MMT:    MMT Right Eval Left Eval  Hip flexion 4 4-  Hip extension 4 4-  Hip abduction 4 4-  Hip adduction 4 4-  Knee flexion 4 4-  Knee extension 4 4-  Ankle dorsiflexion 4 4-  (Blank rows = not tested)  FUNCTIONAL TESTs:  5 times sit to stand: 11.13 sec Timed up and go (TUG): 11.33 sec 6 minute walk test: Perform at evaluation 10 meter walk test: 12.18 sec  PATIENT SURVEYS:  ABC scale 53.75% FOTO  58; Predicted 68   TODAY'S TREATMENT:   TherEx:  Goal assessment performed and noted below:    PATIENT EDUCATION: Education details: Pt educated on role of PT and services provided during current POC, along with prognosis and information about the clinic. Person educated: Patient and Spouse Education method: Explanation Education comprehension: verbalized understanding   HOME EXERCISE PROGRAM:  Access Code: [email or web address] URL: [email or web address] Date: 09/25/2022 Prepared by: [place]  Exercises - Standing Hip Abduction with Counter Support  - 1 x daily - 7 x weekly - 3 sets - 10 reps - Standing Hip Extension with Counter Support  - 1 x daily - 7 x weekly - 3 sets - 10 reps - Standing Knee Flexion with Counter Support  - 1 x daily - 7 x weekly - 3 sets - 10 reps - Standing March with Unilateral Counter Support  - 1 x daily - 7 x weekly - 3 sets - 10 reps - Heel Toe Raises with Unilateral Counter Support  - 1 x daily - 7 x weekly - 3 sets - 10 reps    GOALS: Goals reviewed with patient? Yes  SHORT TERM GOALS: Target date: 12/16/2022  Pt will be independent with HEP in order to demonstrate increased ability to perform tasks related to occupation/hobbies. Baseline: No HEP given at eval Goal status: INITIAL  LONG TERM GOALS: Target date: 02/10/2023  1.  Patient (> 3 years old) will complete five times sit to stand test in < 10 seconds indicating an increased LE strength and improved balance. Baseline:  11.13 sec 11/18/22: 12.81 sec Goal status: PROGRESSING  2.  Patient will increase FOTO score to equal to or greater than 68 to demonstrate statistically significant improvement in mobility and quality of life.  Baseline: 58 11/18/22: 65 Goal status: PROGRESSING   3.  Patient will increase ABC Balance scale by > 11% points to demonstrate decreased fall risk during functional activities. Baseline: 53.75% 11/18/22:  Goal status: PROGRESSING   4.  Patient  will reduce timed up and go to <11 seconds to reduce fall risk and demonstrate improved transfer/gait ability. Baseline: 11.33 sec 11/18/22: 12.30 sec Goal status: PROGRESSING  5. Patient will increase 10 meter walk test to >1.38ms as to improve gait speed for better community ambulation and to reduce fall risk. Baseline: 0.82 m/sec 11/18/22:  0.79 m/sec Goal status: PROGRESSING  6.  Patient will increase six minute walk test distance to >1000 for progression to community ambulator and improve gait ability Baseline: 600 ft but ended at 4 min 34 seconds in. 11/18/22:  Deferred to next visit Goal status: INITIAL    ASSESSMENT:  CLINICAL IMPRESSION:  Pt unable to achieve her goals today, but has not regressed as expected.  Due to recent illness that prevented pt from coming to therapy and causing her to stay in bed for most of the time, pt actually performed well with the tasks given today.  Pt to progress with therapy and improve overall strength and gain her independence with future visits.  Patient's condition has the potential to improve in response to therapy. Maximum improvement is yet to be obtained. The anticipated improvement is attainable and reasonable in a generally predictable time.   Pt will continue to benefit from skilled therapy to address remaining deficits in order to improve overall QoL and return to PLOF.        OBJECTIVE IMPAIRMENTS Abnormal gait, decreased activity tolerance, decreased balance, decreased endurance, decreased mobility, difficulty walking, decreased strength, and pain.   ACTIVITY LIMITATIONS carrying, lifting, bending, squatting, stairs, transfers, dressing, reach over head, and hygiene/grooming  PARTICIPATION LIMITATIONS: cleaning, laundry, shopping, community activity, and yard work  PERSONAL FACTORS Age, Fitness, Past/current experiences, and needing L TKA  are also affecting patient's functional outcome.   REHAB POTENTIAL: Good  CLINICAL  DECISION MAKING: Stable/uncomplicated  EVALUATION COMPLEXITY: Low  PLAN: PT FREQUENCY: 2x/week  PT DURATION: 12 weeks  PLANNED INTERVENTIONS: Therapeutic exercises, Therapeutic activity, Neuromuscular re-education, Balance training, Gait training, Patient/Family education, Self Care, Joint mobilization, DME instructions, Dry Needling, Moist heat, and Manual therapy  PLAN FOR NEXT SESSION:   Endurance and perform strengthening exercises necessary for improved balance.    JGwenlyn Saran PT, DPT Physical Therapist- [facility] 11/18/22, 4:08 PM

## 2022-11-20 ENCOUNTER — Ambulatory Visit: Payer: Medicare HMO | Admitting: Occupational Therapy

## 2022-11-20 ENCOUNTER — Ambulatory Visit: Payer: Medicare HMO

## 2022-11-20 ENCOUNTER — Encounter: Payer: Medicare HMO | Admitting: Speech Pathology

## 2022-11-20 DIAGNOSIS — R0689 Other abnormalities of breathing: Secondary | ICD-10-CM | POA: Diagnosis not present

## 2022-11-20 DIAGNOSIS — K920 Hematemesis: Secondary | ICD-10-CM | POA: Diagnosis not present

## 2022-11-20 DIAGNOSIS — R519 Headache, unspecified: Secondary | ICD-10-CM | POA: Diagnosis not present

## 2022-11-20 DIAGNOSIS — N3289 Other specified disorders of bladder: Secondary | ICD-10-CM | POA: Diagnosis not present

## 2022-11-20 DIAGNOSIS — R4701 Aphasia: Secondary | ICD-10-CM | POA: Diagnosis present

## 2022-11-20 DIAGNOSIS — J45909 Unspecified asthma, uncomplicated: Secondary | ICD-10-CM | POA: Diagnosis present

## 2022-11-20 DIAGNOSIS — F32A Depression, unspecified: Secondary | ICD-10-CM | POA: Diagnosis present

## 2022-11-20 DIAGNOSIS — K219 Gastro-esophageal reflux disease without esophagitis: Secondary | ICD-10-CM | POA: Diagnosis present

## 2022-11-20 DIAGNOSIS — M47812 Spondylosis without myelopathy or radiculopathy, cervical region: Secondary | ICD-10-CM | POA: Diagnosis not present

## 2022-11-20 DIAGNOSIS — D649 Anemia, unspecified: Secondary | ICD-10-CM | POA: Diagnosis not present

## 2022-11-20 DIAGNOSIS — R1084 Generalized abdominal pain: Secondary | ICD-10-CM | POA: Diagnosis not present

## 2022-11-20 DIAGNOSIS — D631 Anemia in chronic kidney disease: Secondary | ICD-10-CM | POA: Diagnosis not present

## 2022-11-20 DIAGNOSIS — E039 Hypothyroidism, unspecified: Secondary | ICD-10-CM | POA: Diagnosis not present

## 2022-11-20 DIAGNOSIS — M6281 Muscle weakness (generalized): Secondary | ICD-10-CM

## 2022-11-20 DIAGNOSIS — Z66 Do not resuscitate: Secondary | ICD-10-CM | POA: Diagnosis not present

## 2022-11-20 DIAGNOSIS — N179 Acute kidney failure, unspecified: Secondary | ICD-10-CM | POA: Diagnosis not present

## 2022-11-20 DIAGNOSIS — R52 Pain, unspecified: Secondary | ICD-10-CM | POA: Diagnosis not present

## 2022-11-20 DIAGNOSIS — Z8673 Personal history of transient ischemic attack (TIA), and cerebral infarction without residual deficits: Secondary | ICD-10-CM | POA: Diagnosis not present

## 2022-11-20 DIAGNOSIS — R270 Ataxia, unspecified: Secondary | ICD-10-CM | POA: Diagnosis not present

## 2022-11-20 DIAGNOSIS — K5730 Diverticulosis of large intestine without perforation or abscess without bleeding: Secondary | ICD-10-CM | POA: Diagnosis not present

## 2022-11-20 DIAGNOSIS — Z8679 Personal history of other diseases of the circulatory system: Secondary | ICD-10-CM | POA: Diagnosis not present

## 2022-11-20 DIAGNOSIS — R569 Unspecified convulsions: Secondary | ICD-10-CM | POA: Diagnosis not present

## 2022-11-20 DIAGNOSIS — R55 Syncope and collapse: Secondary | ICD-10-CM | POA: Diagnosis not present

## 2022-11-20 DIAGNOSIS — M4802 Spinal stenosis, cervical region: Secondary | ICD-10-CM | POA: Diagnosis not present

## 2022-11-20 DIAGNOSIS — I69354 Hemiplegia and hemiparesis following cerebral infarction affecting left non-dominant side: Secondary | ICD-10-CM | POA: Diagnosis not present

## 2022-11-20 DIAGNOSIS — R1909 Other intra-abdominal and pelvic swelling, mass and lump: Secondary | ICD-10-CM | POA: Diagnosis present

## 2022-11-20 DIAGNOSIS — K6389 Other specified diseases of intestine: Secondary | ICD-10-CM | POA: Diagnosis not present

## 2022-11-20 DIAGNOSIS — I130 Hypertensive heart and chronic kidney disease with heart failure and stage 1 through stage 4 chronic kidney disease, or unspecified chronic kidney disease: Secondary | ICD-10-CM | POA: Diagnosis not present

## 2022-11-20 DIAGNOSIS — E611 Iron deficiency: Secondary | ICD-10-CM | POA: Diagnosis present

## 2022-11-20 DIAGNOSIS — R531 Weakness: Secondary | ICD-10-CM | POA: Diagnosis not present

## 2022-11-20 DIAGNOSIS — E876 Hypokalemia: Secondary | ICD-10-CM | POA: Diagnosis not present

## 2022-11-20 DIAGNOSIS — R29818 Other symptoms and signs involving the nervous system: Secondary | ICD-10-CM | POA: Diagnosis not present

## 2022-11-20 DIAGNOSIS — R278 Other lack of coordination: Secondary | ICD-10-CM

## 2022-11-20 DIAGNOSIS — R231 Pallor: Secondary | ICD-10-CM | POA: Diagnosis not present

## 2022-11-20 DIAGNOSIS — E860 Dehydration: Secondary | ICD-10-CM | POA: Diagnosis not present

## 2022-11-20 DIAGNOSIS — R112 Nausea with vomiting, unspecified: Secondary | ICD-10-CM | POA: Diagnosis not present

## 2022-11-20 DIAGNOSIS — F419 Anxiety disorder, unspecified: Secondary | ICD-10-CM | POA: Diagnosis present

## 2022-11-20 DIAGNOSIS — I6501 Occlusion and stenosis of right vertebral artery: Secondary | ICD-10-CM | POA: Diagnosis not present

## 2022-11-20 DIAGNOSIS — J189 Pneumonia, unspecified organism: Secondary | ICD-10-CM | POA: Diagnosis not present

## 2022-11-20 DIAGNOSIS — J690 Pneumonitis due to inhalation of food and vomit: Secondary | ICD-10-CM | POA: Diagnosis not present

## 2022-11-20 DIAGNOSIS — R1111 Vomiting without nausea: Secondary | ICD-10-CM | POA: Diagnosis not present

## 2022-11-20 DIAGNOSIS — E785 Hyperlipidemia, unspecified: Secondary | ICD-10-CM | POA: Diagnosis not present

## 2022-11-20 DIAGNOSIS — I5032 Chronic diastolic (congestive) heart failure: Secondary | ICD-10-CM | POA: Diagnosis not present

## 2022-11-20 DIAGNOSIS — R109 Unspecified abdominal pain: Secondary | ICD-10-CM | POA: Diagnosis not present

## 2022-11-20 DIAGNOSIS — I63211 Cerebral infarction due to unspecified occlusion or stenosis of right vertebral arteries: Secondary | ICD-10-CM | POA: Diagnosis not present

## 2022-11-20 DIAGNOSIS — R262 Difficulty in walking, not elsewhere classified: Secondary | ICD-10-CM

## 2022-11-20 DIAGNOSIS — R41841 Cognitive communication deficit: Secondary | ICD-10-CM

## 2022-11-20 DIAGNOSIS — F418 Other specified anxiety disorders: Secondary | ICD-10-CM | POA: Diagnosis not present

## 2022-11-20 DIAGNOSIS — I2510 Atherosclerotic heart disease of native coronary artery without angina pectoris: Secondary | ICD-10-CM | POA: Diagnosis present

## 2022-11-20 DIAGNOSIS — I6389 Other cerebral infarction: Secondary | ICD-10-CM | POA: Diagnosis not present

## 2022-11-20 DIAGNOSIS — I10 Essential (primary) hypertension: Secondary | ICD-10-CM | POA: Diagnosis not present

## 2022-11-20 DIAGNOSIS — I639 Cerebral infarction, unspecified: Secondary | ICD-10-CM | POA: Diagnosis not present

## 2022-11-20 DIAGNOSIS — E162 Hypoglycemia, unspecified: Secondary | ICD-10-CM | POA: Diagnosis not present

## 2022-11-20 DIAGNOSIS — J4520 Mild intermittent asthma, uncomplicated: Secondary | ICD-10-CM | POA: Diagnosis not present

## 2022-11-20 DIAGNOSIS — N189 Chronic kidney disease, unspecified: Secondary | ICD-10-CM | POA: Diagnosis present

## 2022-11-20 DIAGNOSIS — I6381 Other cerebral infarction due to occlusion or stenosis of small artery: Secondary | ICD-10-CM

## 2022-11-20 NOTE — Therapy (Signed)
Occupational Therapy Treatment Note  [name] Name: [name] MRN: [id number] DOB:[birth year], 81 y.o., female Today's Date: 11/20/2022  PCP: Dr. Doy Hutching REFERRING PROVIDER: Reesa Chew  OT End of Session - 11/20/22 1704     Visit Number 14    Number of Visits 58    Date for OT Re-Evaluation 12/02/22    Authorization Time Period Progress report period starting 09/09/2022    OT Start Time 1607    OT Stop Time 1645    OT Time Calculation (min) 38 min    Activity Tolerance [name] tolerated treatment well    Behavior During Therapy WFL for tasks assessed/performed               Past Medical History:  Diagnosis Date   Anemia    Arthritis    Asthma    uses inhaler just prior to surgery to avoid attack   Back pain    from previous injury   Complication of anesthesia    has woken  up during 2 different surgery   Depression    no current issue/treatment; situation   Gallstones    GERD (gastroesophageal reflux disease)    Hiatal hernia    [name] does NOT have nerve/muscle disease   History of kidney stones    HLD (hyperlipidemia)    HTN (hypertension)    Hypothyroidism    Kidney stones    Knee pain    Non-diabetic pancreatic hormone dysfunction years   pt. 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   Pancreatitis    Pneumonia    Seizures (HCC)    caused by dye injected during a procedure   Shortness of breath    with exertion   Sinus problem    frequent infections/congestion   Stroke ([place]) 2021   reports having CVA in 2021 and having mini strokes before that   Thyroid disease    Past Surgical History:  Procedure Laterality Date   ABDOMINAL HYSTERECTOMY     APPENDECTOMY     CARPAL TUNNEL RELEASE  10+ years ago   bilateral   EYE SURGERY  3 yrs ago   bilateral cataracts   FOOT OSTEOTOMY  6 weeks ago   Left foot: great, 2nd & 3rd   FOOT OSTEOTOMY  5 years ago   Right great toe   HAND SURGERY Bilateral 2011-most recent   multiple hand surgeries, 2 on  left, 3 on right   KNEE ARTHROPLASTY Right 04/28/2022   Procedure: COMPUTER ASSISTED TOTAL KNEE ARTHROPLASTY;  Surgeon: Dereck Leep, MD;  Location: [facility];  Service: Orthopedics;  Laterality: Right;   LOOP RECORDER INSERTION N/A 05/16/2020   Procedure: LOOP RECORDER INSERTION;  Surgeon: Isaias Cowman, MD;  Location: [facility];  Service: Cardiovascular;  Laterality: N/A;   NASAL SINUS SURGERY  most recent 7-8 yrs ago   7 sinus surgeries    TRIGGER FINGER RELEASE  11/19/2011   Procedure: RELEASE TRIGGER FINGER/A-1 PULLEY;  Surgeon: Wynonia Sours, MD;  Location: [facility];  Service: Orthopedics;  Laterality: Right;  release a-1 pulley right index finger and cyst removal   WRIST GANGLION EXCISION  1980's   right   [name] Active Problem List   Diagnosis Date Noted   Nausea and vomiting 10/15/2022   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   Fatigue 09/17/2022   Left hemiparesis ([place]) 09/17/2022   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   GERD (gastroesophageal reflux disease) 08/21/2022   Agitation 08/20/2022   Acute left-sided  weakness 08/20/2022   Expressive aphasia    Stroke ([place]) 08/19/2022   Leukocytosis 08/19/2022   History of urticaria 04/28/2022   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   Primary osteoarthritis of left knee 02/24/2022   Primary osteoarthritis of right knee 02/24/2022   Lumbar spondylolysis 04/12/2020   History of CVA (cerebrovascular accident) 03/26/2020   Low back pain radiating to right lower extremity 03/21/2020   B12 deficiency 03/06/2020   Positive anti-CCP test 12/21/2019   Arthralgia 12/13/2019   Dermatitis 12/13/2019   Rheumatoid factor positive 12/13/2019   Essential hypertension 12/11/2018   Palpitations 12/11/2018   Acquired hypothyroidism 11/10/2018   Arthritis of knee 09/17/2016   Anxiety 11/22/2014   Asthma without status asthmaticus 11/22/2014   Benign neoplasm of colon, unspecified 11/22/2014    Environmental allergies 11/22/2014   Hypertriglyceridemia 11/22/2014   Hypokalemia 11/22/2014   Personal history of disease of skin and subcutaneous tissue 11/22/2014    ONSET DATE: 08/18/2022  REFERRING DIAG: CVA  THERAPY DIAG:  Muscle weakness (generalized)  Rationale for Evaluation and Treatment Rehabilitation  SUBJECTIVE:   SUBJECTIVE STATEMENT:   Pt. Reports feeling so tired today, and has been sleeping a lot.   Pt accompanied by: significant other  PERTINENT HISTORY: [name] is a 81 year old female who was diagnosed with a right thalamic CVA on 08/18/2022 with left-sided weakness.  [name] underwent inpatient rehabilitation for 2 weeks. [name] was assessed and was scheduled for a knee replacement on 08/29/2022 however had to cancel it due to having had a CVA.  [name] had a recent fall 2 days after discharging from inpatient rehab. past medical history includes: Knee replacement, essential HTN, hypokalemia, leukocytosis, seizures, positive anti-- CCP test, anxiety disorder, mini strokes.  [name] had shingles with left eye nerve pain s/p 1 year ago.   PRECAUTIONS: Fall  WEIGHT BEARING RESTRICTIONS No  PAIN:  Are you having pain? Yes, 0/10 left shoulder.  Pt. Reports chronic left shoulder pain due to decreased cartilage in the joint space. Pt. Reports that it started when she was in her 31's.  FALLS: Has [name] fallen in last 6 months? Yes. Number of falls 1  LIVING ENVIRONMENT: Lives with: Lives with Spouse Lives in: House/apartment Stairs: 2 storey home, resides on the first floor.  External: 2 stairs front no rails, and 6 in back with rails Has following equipment at home: Single point cane, Walker - 2 wheeled, Environmental consultant - 4 wheeled, Shower bench, and bed side commode  PLOF: Independent  [name] GOALS  Pt. Regain the use of her left arm  OBJECTIVE:   HAND DOMINANCE: Right  ADLs: Overall ADLs: Husband assists pt. as needed Transfers/ambulation related to ADLs:Pt.  Has assistive devices. Eating: Pt. Is independent with the right hand. Pt. Is unable to cut food, and open packets Grooming: Pt. Is using her right hand, however has difficulty using her left to assist with haircare UB Dressing: Pt. Is independent donning a pullover shirt, and button down shirt. Has difficulty with buttoning, LB Dressing:  Independent donning pants, and socks. Difficulty tying shoes. Toileting: Independent Bathing: independent using the right hand, difficulty using the LUE for the right side. Tub Shower transfers: Supervision Equipment: See above for equipment   IADLs: Shopping:  Has not had the opportunity for grocery shopping yet Light housekeeping: Husband assisting with light house keeping Meal Prep:  Dependent Community mobility: Relies of family/friends Medication management: Husband Oncologist: TBD Handwriting: 75% legible  MOBILITY STATUS: Hx of falls  POSTURE COMMENTS:  No  Significant postural limitations Sitting balance: supported sitting balance WFL  ACTIVITY TOLERANCE: Activity tolerance:  Fatigues with 10-15 min.  FUNCTIONAL OUTCOME MEASURES: FOTO: 45  UPPER EXTREMITY ROM     Active ROM Right Eval: WFL Left eval Left 10/23/22  Shoulder flexion  55(117) 135(153)  Shoulder abduction  67(108 140  Shoulder adduction     Shoulder extension     Shoulder internal rotation     Shoulder external rotation     Elbow flexion  122(144) 143  Elbow extension  -16(0) 0  Wrist flexion  62 62  Wrist extension  -22(50) 28(64)  Wrist ulnar deviation     Wrist radial deviation     Wrist pronation     Wrist supination     (Blank rows = not tested)  Left digit flexion to [facility]: 2nd: 2cm, 3rd: 0cm, 4th: 0cm, 5th: 0cm   Eval: Limited Left full digit extension  10/23/22: Limited left 2nd digit extension. Full 3rd, 4th, and 5th digit extension.    UPPER EXTREMITY MMT:     MMT Right Eval: 4+/5 overall Left eval Left  10/23/2022   Shoulder flexion  3-/5 3+/5  Shoulder abduction  3-/5 3/5  Shoulder adduction     Shoulder extension     Shoulder internal rotation     Shoulder external rotation     Middle trapezius     Lower trapezius     Elbow flexion  3+/5 4+/5  Elbow extension  3+/5 4+/5  Wrist flexion     Wrist extension  2-/5 2+/5  Wrist ulnar deviation     Wrist radial deviation     Wrist pronation     Wrist supination     (Blank rows = not tested)  HAND FUNCTION: Eval: Left Unable  10/23/2022: Right TBA Grip strength; R:  L: 13#, Pinch strength: R:  L: 3#, 3Pt. Pinch strength: R:  L: 2#  COORDINATION: Eval: Impaired. Unable to perform 9 hole pegs test. 10/23/2022: 9 Hole Peg Test: R:    L: 5 pegs placed in 3 min. & 16 sec.   SENSATION: Light touch: WFL, proprioceptive awareness: Intact  EDEMA: N/A  MUSCLE TONE: LUE: Hypotonic  COGNITION: Overall cognitive status: Within functional limits for tasks assessed Pt. reports memory limitations.  [name] has bilateral hearing aids however was not wearing them during the initial evaluation.  VISION: Subjective report: Pt. report having shingles affecting left eye  s/p 1 year. Has nerve pain Baseline vision: Wears glasses for reading only Visual history: Report no changes  VISION ASSESSMENT:   WFL for tasks performed  PERCEPTION: Intact  PRAXIS: Impaired: Motor planning  OBSERVATIONS: [name] reports being sore following a recent fall after discharging from inpatient rehab.   TODAY'S TREATMENT:      There. Ex.:   Pt. tolerated AAROM, and PROM for left shoulder flexion, and abduction. Pt. performed reps digit extension, and thumb IP extension with her hand positioned flat at the tabletop surface.   Neuromuscular re-education:   Pt. worked on isolated 2nd digit extension. Pt. worked on isolating the 2nd digit with full digit PIP extension while flexing the 3rd, 4th, and 5th digits.  Manual Therapy:  Pt. tolerated soft tissue  mobilizations for the left hand for carpal, and metacarpal spread stretches.   Pt. was more consistently able to isolate the 2nd digit, however required cues for full digit extension. Pt. continues to present with less tightness in the thumb IP flexors with more active motion when preparing to  grasp items. Pt.  Education was provided about performing home exercises in a smaller increments. [name] continues to work on improving left upper extremity strength, wrist and digit extension, and overall LUE functioning in order to improve engagement in, and maximize independence in ADLs and IADL tasks.      [name] EDUCATION: Education details: OT services, POC, and goals. Person educated: [name] and Spouse Education method: Customer service manager Education comprehension: verbalized understanding, returned demonstration, and needs further education   HOME EXERCISE PROGRAM:  Assess ongoing need for HEP    GOALS: Goals reviewed with [name]? Yes  SHORT TERM GOALS: Target date: 12/02/2022   [name] will be independent with home exercise program for the left upper extremity Baseline: 10/23/22: Pt. Is independent with Home exercise recommendations provided to date.  Eval: No current home exercise program Goal status: Ongoing  2.  [name] will independently formulate a full composite fist with the left hand.  Baseline:12/23/2021: Pt. Is able to formulate a full composite fist with the left hand. Eval: Second digit to the [facility] 2cm.  Goal status: Met              3: Pt. will improve active digit extension to be able to place her hand flat tabletop surface in preparation for weightbearing proprioceptive input. Baseline: 10/23/2022: Pt. is able to achieve full 3rd, 4th, and 5th digit extension. Pt. presents with limited 2nd digit extension. Eval: Pt. Is unable to actively full digit extension Goal status:  Partially met   LONG TERM GOALS: Target date: 12/02/2022  [name] will improve  active left shoulder flexion by 20 degrees in preparation for reaching for the refrigerator door. Baseline: 10/23/2022: 135(153) Eval: Left shoulder flexion 55(117) Goal status: Met  2.  [name] will improve left shoulder active abduction to be able to comb her hair Baseline: 10/23/2022: shoulder abduction: 140. Pt. Has difficulty sustaining UEs in elevation while performing hair care. Eval: Left shoulder abduction is 67(108) Goal status: Partially Met; advised see below  3.  [name] will improve left hand [facility] skills in order to be able to button her shirt with modified independence. Baseline: 10/23/2022:  Left [place] on 9 hole peg test: 5 pegs placed in 27mn. &16 sec. Pt. Continues to have difficulty completing buttoning efficiently.Eval: [name] requires increased time to complete Goal status: Revised  4.  [name] well improve left grip strength preparation for cutting food Baseline: 10/23/2022: Left grip strength: 13# Eval: Pt. was unable to formulate a grip to hold items Goal status: Ongoing  5.  Pt. will independently recall adaptive  strategies for performing ADL tasks including: flossing teeth, donning bra, applying makeup. Baseline: 10/23/2022: Pt. Is starting to engage her left hand during more daily tasks. Eval: Pt. unable to perform Goal status: Ongoing  6.  Pt. will improve FOTO score by 5 points to reflect [name] perceived performance improvement assessment specific ADLs  and IADLs Baseline: 10/23/2022: 54 Eval: 45 Goal status: ongoing  2.  [name] will improve left shoulder strength in order to be able to sustain UEs in elevation while performing hair care.  Baseline: 10/23/2022: Shoulder flexion: 3+/5, abduction: 3/5 Goal status: New     ASSESSMENT:  CLINICAL IMPRESSION:    Pt. was more consistently able to isolate the 2nd digit, however required cues for full digit extension. Pt. continues to present with less tightness in the thumb IP flexors with more active motion  when preparing to grasp items. Pt.  Education was provided about performing home exercises in a  smaller increments. [name] continues to work on improving left upper extremity strength, wrist and digit extension, and overall LUE functioning in order to improve engagement in, and maximize independence in ADLs and IADL tasks.     PERFORMANCE DEFICITS in functional skills including ADLs, IADLs, coordination, proprioception, ROM, strength, [place], and [facility], cognitive skills including memory, and psychosocial skills including coping strategies, environmental adaptation, interpersonal interactions, and routines and behaviors.   IMPAIRMENTS are limiting [name] from ADLs, IADLs, education, leisure, and social participation.   COMORBIDITIES may have co-morbidities  that affects occupational performance. [name] will benefit from skilled OT to address above impairments and improve overall function.  MODIFICATION OR ASSISTANCE TO COMPLETE EVALUATION: Min-Moderate modification of tasks or assist with assess necessary to complete an evaluation.  OT OCCUPATIONAL PROFILE AND HISTORY: Detailed assessment: Review of records and additional review of physical, cognitive, psychosocial history related to current functional performance.  CLINICAL DECISION MAKING: Moderate - several treatment options, min-mod task modification necessary  REHAB POTENTIAL: Good  EVALUATION COMPLEXITY: Moderate    PLAN: OT FREQUENCY: 3x/week  OT DURATION: 12 weeks  PLANNED INTERVENTIONS: self care/ADL training, therapeutic exercise, therapeutic activity, neuromuscular re-education, manual therapy, passive range of motion, functional mobility training, electrical stimulation, and paraffin  RECOMMENDED OTHER SERVICES: PT  CONSULTED AND AGREED WITH PLAN OF CARE: [name] and family member/caregiver  PLAN FOR NEXT SESSION: Initiate OT treatment  Harrel Carina, MS, OTR/L  11/20/2022, 5:09 PM

## 2022-11-20 NOTE — Therapy (Signed)
OUTPATIENT PHYSICAL THERAPY NEURO TREATMENT    Patient Name: [name] MRN: [id number] DOB:1941-07-07, 81 y.o., female Today's Date: 11/20/2022   PCP: Idelle Crouch, MD REFERRING PROVIDER: Flora Lipps   PT End of Session - 11/20/22 1528     Visit Number 11    Number of Visits 24    Date for PT Re-Evaluation 12/16/22    Authorization Type [employer] - Needs Authorization    PT Start Time 1528    PT Stop Time 1600    PT Time Calculation (min) 32 min    Equipment Utilized During Treatment Gait belt    Activity Tolerance Patient tolerated treatment well    Behavior During Therapy WFL for tasks assessed/performed              Past Medical History:  Diagnosis Date   Anemia    Arthritis    Asthma    uses inhaler just prior to surgery to avoid attack   Back pain    from previous injury   Complication of anesthesia    has woken  up during 2 different surgery   Depression    no current issue/treatment; situation   Gallstones    GERD (gastroesophageal reflux disease)    Hiatal hernia    patient does NOT have nerve/muscle disease   History of kidney stones    HLD (hyperlipidemia)    HTN (hypertension)    Hypothyroidism    Kidney stones    Knee pain    Non-diabetic pancreatic hormone dysfunction years   pt. 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   Pancreatitis    Pneumonia    Seizures (HCC)    caused by dye injected during a procedure   Shortness of breath    with exertion   Sinus problem    frequent infections/congestion   Stroke ([place]) 2021   reports having CVA in 2021 and having mini strokes before that   Thyroid disease    Past Surgical History:  Procedure Laterality Date   ABDOMINAL HYSTERECTOMY     APPENDECTOMY     CARPAL TUNNEL RELEASE  10+ years ago   bilateral   EYE SURGERY  3 yrs ago   bilateral cataracts   FOOT OSTEOTOMY  6 weeks ago   Left foot: great, 2nd & 3rd   FOOT OSTEOTOMY  5 years ago   Right great toe    HAND SURGERY Bilateral 2011-most recent   multiple hand surgeries, 2 on left, 3 on right   KNEE ARTHROPLASTY Right 04/28/2022   Procedure: COMPUTER ASSISTED TOTAL KNEE ARTHROPLASTY;  Surgeon: Dereck Leep, MD;  Location: [facility];  Service: Orthopedics;  Laterality: Right;   LOOP RECORDER INSERTION N/A 05/16/2020   Procedure: LOOP RECORDER INSERTION;  Surgeon: Isaias Cowman, MD;  Location: [facility];  Service: Cardiovascular;  Laterality: N/A;   NASAL SINUS SURGERY  most recent 7-8 yrs ago   7 sinus surgeries    TRIGGER FINGER RELEASE  11/19/2011   Procedure: RELEASE TRIGGER FINGER/A-1 PULLEY;  Surgeon: Wynonia Sours, MD;  Location: [facility];  Service: Orthopedics;  Laterality: Right;  release a-1 pulley right index finger and cyst removal   WRIST GANGLION EXCISION  1980's   right   Patient Active Problem List   Diagnosis Date Noted   Nausea and vomiting 10/15/2022   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   Fatigue 09/17/2022   Left hemiparesis ([place]) 09/17/2022   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   GERD (gastroesophageal reflux disease) 08/21/2022   Agitation 08/20/2022   Acute left-sided weakness 08/20/2022   Expressive aphasia    Stroke ([place]) 08/19/2022   Leukocytosis 08/19/2022   History of urticaria 04/28/2022   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   Primary osteoarthritis of left knee 02/24/2022   Primary osteoarthritis of right knee 02/24/2022   Lumbar spondylolysis 04/12/2020   History of CVA (cerebrovascular accident) 03/26/2020   Low back pain radiating to right lower extremity 03/21/2020   B12 deficiency 03/06/2020   Positive anti-CCP test 12/21/2019   Arthralgia 12/13/2019   Dermatitis 12/13/2019   Rheumatoid factor positive 12/13/2019   Essential hypertension 12/11/2018   Palpitations 12/11/2018   Acquired hypothyroidism 11/10/2018   Arthritis of knee 09/17/2016   Anxiety 11/22/2014   Asthma without status asthmaticus  11/22/2014   Benign neoplasm of colon, unspecified 11/22/2014   Environmental allergies 11/22/2014   Hypertriglyceridemia 11/22/2014   Hypokalemia 11/22/2014   Personal history of disease of skin and subcutaneous tissue 11/22/2014    ONSET DATE: 08/18/22  REFERRING DIAG: I63.81 (ICD-10-CM) - 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    Pt accompanied by: self and significant other; [name].  PERTINENT HISTORY:   Patient is a 81 year old female who was diagnosed with a right thalamic CVA on 08/18/2022 with left-sided weakness.  Patient underwent inpatient rehabilitation for 2 weeks.  Patient was assessed and was scheduled for a knee replacement on 08/29/2022 however had to cancel it due to having had a CVA.  Patient had a recent fall 2 days after discharging from inpatient rehab. past medical history includes: Knee replacement, essential HTN, hypokalemia, leukocytosis, seizures, positive anti-- CCP test, anxiety disorder, mini strokes.  Patient had shingles with left eye nerve pain s/p 1 year ago.   PAIN:  Are you having pain? No pain.  PRECAUTIONS: None  WEIGHT BEARING RESTRICTIONS No  FALLS: Has patient fallen in last 6 months? Yes. Number of falls 3  LIVING ENVIRONMENT: Lives with: lives with their spouse Lives in: House/apartment Stairs: Yes: Internal: 12 steps; on left going up and External: 6 in the back steps; can reach both Has  following equipment at home: Gilford Rile - 2 wheeled  PLOF: Independent  PATIENT GOALS Pt wants to get use of her shoulder and her legs back and improve balance.  OBJECTIVE:   DIAGNOSTIC FINDINGS:   FINDINGS: Brain: Acute infarct in the right corona radiata. Small acute infarct in the right anterior thalamus.   Ventricle size normal. Patchy white matter hyperintensity bilaterally compatible with chronic microvascular ischemia. Chronic infarcts in the thalamus bilaterally. Chronic infarcts in the lateral basal ganglia bilaterally. Negative for hemorrhage or mass.   Image quality degraded by motion   Vascular: Normal arterial flow voids.   Skull and upper cervical spine: No focal skeletal lesion. C1-2 arthropathy with pannus.   Sinuses/Orbits: Paranasal sinuses clear. Bilateral cataract extraction   Other: None   IMPRESSION: Acute infarct in the right corona radiata. Small acute infarct in the right anterior thalamus  COGNITION: Overall cognitive status: Within functional limits for tasks assessed   SENSATION: WFL; pt notes having a history of the hands having some numbness/tingling  COORDINATION: Pt unable to do quick coordination tests in the L UE.  Pt able to tolerate movement, but unable to do the movements quickly.  POSTURE: No Significant postural limitations   LOWER EXTREMITY MMT:    MMT Right Eval Left Eval  Hip flexion 4 4-  Hip extension 4 4-  Hip abduction 4 4-  Hip adduction 4 4-  Knee flexion 4 4-  Knee extension 4 4-  Ankle dorsiflexion 4 4-  (Blank rows = not tested)  FUNCTIONAL TESTs:  5 times sit to stand: 11.13 sec Timed up and go (TUG): 11.33 sec 6 minute walk test: Perform at evaluation 10 meter walk test: 12.18 sec  PATIENT SURVEYS:  ABC scale 53.75% FOTO 58; Predicted 68   TODAY'S TREATMENT:   TherEx:  Round 1: Seated marches, x10 each LE with 4# AW donned Seated LAQ, x10 with 4# AW Seated heel raises, x10 with 4# AW  donned Seated toe raises, x10 with 4# AW donned Seated adduction into rainbow physioball, x10 Seated step over hedgehogs, x10 with 4# AW donned  BP: 107/57 HR: 85   Attempted to perform ambulation and pt too fatigued to continue with therapy and stumbled to the point therapist had to apply maxA to prevent uncontrolled descent to the ground.  PATIENT EDUCATION: Education details: Pt educated on role of PT and services provided during current POC, along with prognosis and information about the clinic. Person educated: Patient and Spouse Education method: Explanation Education comprehension: verbalized understanding   HOME EXERCISE PROGRAM:  Access Code: [email or web address] URL: [email or web address] Date: 09/25/2022 Prepared by: [place]  Exercises - Standing Hip Abduction with Counter Support  - 1 x daily - 7 x weekly - 3 sets - 10 reps - Standing Hip Extension with Counter Support  - 1 x daily - 7 x weekly - 3 sets - 10 reps - Standing Knee Flexion with Counter Support  - 1 x daily - 7 x weekly - 3 sets - 10 reps - Standing March with Unilateral Counter Support  - 1 x daily - 7 x weekly - 3 sets - 10 reps - Heel Toe Raises with Unilateral Counter Support  - 1 x daily - 7 x weekly - 3 sets - 10 reps    GOALS: Goals reviewed with patient? Yes  SHORT TERM GOALS: Target date: 12/18/2022  Pt will be independent with HEP in order to demonstrate increased ability to perform tasks related to occupation/hobbies. Baseline: No HEP given at eval Goal status: INITIAL  LONG TERM  GOALS: Target date: 02/12/2023  1.  Patient (> 59 years old) will complete five times sit to stand test in < 10 seconds indicating an increased LE strength and improved balance. Baseline: 11.13 sec 11/18/22: 12.81 sec Goal status: PROGRESSING  2.  Patient will increase FOTO score to equal to or greater than 68 to demonstrate statistically significant improvement in mobility and quality of life.   Baseline: 58 11/18/22: 65 Goal status: PROGRESSING   3.  Patient will increase ABC Balance scale by > 11% points to demonstrate decreased fall risk during functional activities. Baseline: 53.75% 11/18/22:  Goal status: PROGRESSING   4.  Patient will reduce timed up and go to <11 seconds to reduce fall risk and demonstrate improved transfer/gait ability. Baseline: 11.33 sec 11/18/22: 12.30 sec Goal status: PROGRESSING  5. Patient will increase 10 meter walk test to >1.71ms as to improve gait speed for better community ambulation and to reduce fall risk. Baseline: 0.82 m/sec 11/18/22:  0.79 m/sec Goal status: PROGRESSING  6.  Patient will increase six minute walk test distance to >1000 for progression to community ambulator and improve gait ability Baseline: 600 ft but ended at 4 min 34 seconds in. 11/18/22:  Deferred to next visit Goal status: INITIAL    ASSESSMENT:  CLINICAL IMPRESSION:  Pt performed well while in seated position, however fatigued extremely quickly.  Pt and husband advised to have blood work performed and to see cardiologist in order to see if her heart is causing any of the issue with her fatigue.  Pt and husband given recommendation/information to Dr. GRockey Situas he is known to several colleagues in the department.  Pt very unsteady when standing from seated position on both attempts to get out of the chair.  Pt's BP is WBL at this time, and pt does not check glucose consistently (although she does not have a history that would require her to do so).  Pt assisted over to OT, notifying therapist of current pt condition.   Pt will continue to benefit from skilled therapy to address remaining deficits in order to improve overall QoL and return to PLOF.      OBJECTIVE IMPAIRMENTS Abnormal gait, decreased activity tolerance, decreased balance, decreased endurance, decreased mobility, difficulty walking, decreased strength, and pain.   ACTIVITY LIMITATIONS carrying,  lifting, bending, squatting, stairs, transfers, dressing, reach over head, and hygiene/grooming  PARTICIPATION LIMITATIONS: cleaning, laundry, shopping, community activity, and yard work  PERSONAL FACTORS Age, Fitness, Past/current experiences, and needing L TKA  are also affecting patient's functional outcome.   REHAB POTENTIAL: Good  CLINICAL DECISION MAKING: Stable/uncomplicated  EVALUATION COMPLEXITY: Low  PLAN: PT FREQUENCY: 2x/week  PT DURATION: 12 weeks  PLANNED INTERVENTIONS: Therapeutic exercises, Therapeutic activity, Neuromuscular re-education, Balance training, Gait training, Patient/Family education, Self Care, Joint mobilization, DME instructions, Dry Needling, Moist heat, and Manual therapy  PLAN FOR NEXT SESSION:   Endurance and perform strengthening exercises necessary for improved balance.    JGwenlyn Saran PT, DPT Physical Therapist- [facility] 11/20/22, 3:29 PM

## 2022-11-23 ENCOUNTER — Emergency Department: Payer: Medicare HMO

## 2022-11-23 ENCOUNTER — Inpatient Hospital Stay: Payer: Medicare HMO

## 2022-11-23 ENCOUNTER — Other Ambulatory Visit: Payer: Self-pay

## 2022-11-23 ENCOUNTER — Observation Stay: Payer: Medicare HMO

## 2022-11-23 ENCOUNTER — Inpatient Hospital Stay
Admission: EM | Admit: 2022-11-23 | Discharge: 2022-11-27 | DRG: 064 | Disposition: A | Discharge status: Rehabilitation Facility | Payer: Medicare HMO | Attending: Student | Admitting: Student

## 2022-11-23 DIAGNOSIS — R443 Hallucinations, unspecified: Secondary | ICD-10-CM | POA: Diagnosis not present

## 2022-11-23 DIAGNOSIS — K6389 Other specified diseases of intestine: Secondary | ICD-10-CM

## 2022-11-23 DIAGNOSIS — R55 Syncope and collapse: Secondary | ICD-10-CM

## 2022-11-23 DIAGNOSIS — Z823 Family history of stroke: Secondary | ICD-10-CM

## 2022-11-23 DIAGNOSIS — I5032 Chronic diastolic (congestive) heart failure: Secondary | ICD-10-CM | POA: Diagnosis present

## 2022-11-23 DIAGNOSIS — D649 Anemia, unspecified: Secondary | ICD-10-CM | POA: Diagnosis not present

## 2022-11-23 DIAGNOSIS — E860 Dehydration: Secondary | ICD-10-CM | POA: Diagnosis present

## 2022-11-23 DIAGNOSIS — Z96651 Presence of right artificial knee joint: Secondary | ICD-10-CM | POA: Diagnosis present

## 2022-11-23 DIAGNOSIS — I639 Cerebral infarction, unspecified: Secondary | ICD-10-CM

## 2022-11-23 DIAGNOSIS — I130 Hypertensive heart and chronic kidney disease with heart failure and stage 1 through stage 4 chronic kidney disease, or unspecified chronic kidney disease: Secondary | ICD-10-CM | POA: Diagnosis present

## 2022-11-23 DIAGNOSIS — Z66 Do not resuscitate: Secondary | ICD-10-CM | POA: Diagnosis present

## 2022-11-23 DIAGNOSIS — J45909 Unspecified asthma, uncomplicated: Secondary | ICD-10-CM | POA: Diagnosis present

## 2022-11-23 DIAGNOSIS — I6389 Other cerebral infarction: Secondary | ICD-10-CM | POA: Diagnosis not present

## 2022-11-23 DIAGNOSIS — Z79899 Other long term (current) drug therapy: Secondary | ICD-10-CM

## 2022-11-23 DIAGNOSIS — R112 Nausea with vomiting, unspecified: Secondary | ICD-10-CM

## 2022-11-23 DIAGNOSIS — I69354 Hemiplegia and hemiparesis following cerebral infarction affecting left non-dominant side: Secondary | ICD-10-CM | POA: Diagnosis not present

## 2022-11-23 DIAGNOSIS — I6501 Occlusion and stenosis of right vertebral artery: Secondary | ICD-10-CM | POA: Diagnosis not present

## 2022-11-23 DIAGNOSIS — Z79890 Hormone replacement therapy (postmenopausal): Secondary | ICD-10-CM

## 2022-11-23 DIAGNOSIS — E162 Hypoglycemia, unspecified: Secondary | ICD-10-CM | POA: Diagnosis not present

## 2022-11-23 DIAGNOSIS — E611 Iron deficiency: Secondary | ICD-10-CM | POA: Diagnosis present

## 2022-11-23 DIAGNOSIS — F418 Other specified anxiety disorders: Secondary | ICD-10-CM

## 2022-11-23 DIAGNOSIS — R52 Pain, unspecified: Secondary | ICD-10-CM | POA: Diagnosis not present

## 2022-11-23 DIAGNOSIS — Z800 Family history of malignant neoplasm of digestive organs: Secondary | ICD-10-CM

## 2022-11-23 DIAGNOSIS — I2510 Atherosclerotic heart disease of native coronary artery without angina pectoris: Secondary | ICD-10-CM | POA: Diagnosis present

## 2022-11-23 DIAGNOSIS — R1909 Other intra-abdominal and pelvic swelling, mass and lump: Secondary | ICD-10-CM | POA: Diagnosis present

## 2022-11-23 DIAGNOSIS — N179 Acute kidney failure, unspecified: Secondary | ICD-10-CM

## 2022-11-23 DIAGNOSIS — E785 Hyperlipidemia, unspecified: Secondary | ICD-10-CM

## 2022-11-23 DIAGNOSIS — R4701 Aphasia: Secondary | ICD-10-CM | POA: Diagnosis present

## 2022-11-23 DIAGNOSIS — Z882 Allergy status to sulfonamides status: Secondary | ICD-10-CM

## 2022-11-23 DIAGNOSIS — Z886 Allergy status to analgesic agent status: Secondary | ICD-10-CM

## 2022-11-23 DIAGNOSIS — R29707 NIHSS score 7: Secondary | ICD-10-CM | POA: Diagnosis present

## 2022-11-23 DIAGNOSIS — R531 Weakness: Principal | ICD-10-CM

## 2022-11-23 DIAGNOSIS — K920 Hematemesis: Secondary | ICD-10-CM | POA: Diagnosis not present

## 2022-11-23 DIAGNOSIS — I63211 Cerebral infarction due to unspecified occlusion or stenosis of right vertebral arteries: Principal | ICD-10-CM | POA: Diagnosis present

## 2022-11-23 DIAGNOSIS — Z91040 Latex allergy status: Secondary | ICD-10-CM

## 2022-11-23 DIAGNOSIS — F419 Anxiety disorder, unspecified: Secondary | ICD-10-CM | POA: Diagnosis present

## 2022-11-23 DIAGNOSIS — D75839 Thrombocytosis, unspecified: Secondary | ICD-10-CM | POA: Diagnosis present

## 2022-11-23 DIAGNOSIS — R109 Unspecified abdominal pain: Secondary | ICD-10-CM | POA: Diagnosis present

## 2022-11-23 DIAGNOSIS — R471 Dysarthria and anarthria: Secondary | ICD-10-CM | POA: Diagnosis present

## 2022-11-23 DIAGNOSIS — E039 Hypothyroidism, unspecified: Secondary | ICD-10-CM

## 2022-11-23 DIAGNOSIS — Z7952 Long term (current) use of systemic steroids: Secondary | ICD-10-CM

## 2022-11-23 DIAGNOSIS — J4520 Mild intermittent asthma, uncomplicated: Secondary | ICD-10-CM | POA: Diagnosis not present

## 2022-11-23 DIAGNOSIS — N189 Chronic kidney disease, unspecified: Secondary | ICD-10-CM | POA: Diagnosis present

## 2022-11-23 DIAGNOSIS — F32A Depression, unspecified: Secondary | ICD-10-CM | POA: Diagnosis present

## 2022-11-23 DIAGNOSIS — Z8249 Family history of ischemic heart disease and other diseases of the circulatory system: Secondary | ICD-10-CM

## 2022-11-23 DIAGNOSIS — R29810 Facial weakness: Secondary | ICD-10-CM | POA: Diagnosis present

## 2022-11-23 DIAGNOSIS — Z87442 Personal history of urinary calculi: Secondary | ICD-10-CM

## 2022-11-23 DIAGNOSIS — E876 Hypokalemia: Secondary | ICD-10-CM | POA: Diagnosis not present

## 2022-11-23 DIAGNOSIS — R5383 Other fatigue: Secondary | ICD-10-CM | POA: Diagnosis not present

## 2022-11-23 DIAGNOSIS — D631 Anemia in chronic kidney disease: Secondary | ICD-10-CM | POA: Diagnosis present

## 2022-11-23 DIAGNOSIS — Z888 Allergy status to other drugs, medicaments and biological substances status: Secondary | ICD-10-CM

## 2022-11-23 DIAGNOSIS — I10 Essential (primary) hypertension: Secondary | ICD-10-CM

## 2022-11-23 DIAGNOSIS — Z7902 Long term (current) use of antithrombotics/antiplatelets: Secondary | ICD-10-CM

## 2022-11-23 DIAGNOSIS — Z7982 Long term (current) use of aspirin: Secondary | ICD-10-CM

## 2022-11-23 DIAGNOSIS — Z90710 Acquired absence of both cervix and uterus: Secondary | ICD-10-CM

## 2022-11-23 DIAGNOSIS — J690 Pneumonitis due to inhalation of food and vomit: Secondary | ICD-10-CM | POA: Diagnosis present

## 2022-11-23 DIAGNOSIS — Z803 Family history of malignant neoplasm of breast: Secondary | ICD-10-CM

## 2022-11-23 DIAGNOSIS — R1084 Generalized abdominal pain: Secondary | ICD-10-CM | POA: Diagnosis not present

## 2022-11-23 DIAGNOSIS — K219 Gastro-esophageal reflux disease without esophagitis: Secondary | ICD-10-CM | POA: Diagnosis present

## 2022-11-23 DIAGNOSIS — Z8673 Personal history of transient ischemic attack (TIA), and cerebral infarction without residual deficits: Secondary | ICD-10-CM | POA: Diagnosis not present

## 2022-11-23 LAB — TROPONIN I (HIGH SENSITIVITY)
Troponin I (High Sensitivity): 7 ng/L (ref <18)
Troponin I (High Sensitivity): 10 ng/L (ref <18)

## 2022-11-23 LAB — CBC
HCT: 34 % — ABNORMAL LOW (ref 36.0–46.0)
HCT: 31.1 % — ABNORMAL LOW (ref 36.0–46.0)
HCT: 29.2 % — ABNORMAL LOW (ref 36.0–46.0)
Hemoglobin: 10.5 g/dL — ABNORMAL LOW (ref 12.0–15.0)
Hemoglobin: 9.8 g/dL — ABNORMAL LOW (ref 12.0–15.0)
Hemoglobin: 9.2 g/dL — ABNORMAL LOW (ref 12.0–15.0)
MCH: 28.7 pg (ref 26.0–34.0)
MCH: 28.4 pg (ref 26.0–34.0)
MCH: 28.5 pg (ref 26.0–34.0)
MCHC: 30.9 g/dL (ref 30.0–36.0)
MCHC: 31.5 g/dL (ref 30.0–36.0)
MCHC: 31.5 g/dL (ref 30.0–36.0)
MCV: 92.9 fL (ref 80.0–100.0)
MCV: 90.1 fL (ref 80.0–100.0)
MCV: 90.4 fL (ref 80.0–100.0)
Platelets: 546 10*3/uL — ABNORMAL HIGH (ref 150–400)
Platelets: 541 10*3/uL — ABNORMAL HIGH (ref 150–400)
Platelets: 501 10*3/uL — ABNORMAL HIGH (ref 150–400)
RBC: 3.66 MIL/uL — ABNORMAL LOW (ref 3.87–5.11)
RBC: 3.45 MIL/uL — ABNORMAL LOW (ref 3.87–5.11)
RBC: 3.23 MIL/uL — ABNORMAL LOW (ref 3.87–5.11)
RDW: 14.7 % (ref 11.5–15.5)
RDW: 14.9 % (ref 11.5–15.5)
RDW: 14.8 % (ref 11.5–15.5)
WBC: 11.6 10*3/uL — ABNORMAL HIGH (ref 4.0–10.5)
WBC: 13.5 10*3/uL — ABNORMAL HIGH (ref 4.0–10.5)
WBC: 13 10*3/uL — ABNORMAL HIGH (ref 4.0–10.5)
nRBC: 0 % (ref 0.0–0.2)
nRBC: 0 % (ref 0.0–0.2)
nRBC: 0 % (ref 0.0–0.2)

## 2022-11-23 LAB — URINALYSIS, ROUTINE W REFLEX MICROSCOPIC
Bilirubin Urine: NEGATIVE
Glucose, UA: 50 mg/dL — AB
Ketones, ur: NEGATIVE mg/dL
Leukocytes,Ua: NEGATIVE
Nitrite: NEGATIVE
Protein, ur: NEGATIVE mg/dL
Specific Gravity, Urine: 1.026 (ref 1.005–1.030)
pH: 6 (ref 5.0–8.0)

## 2022-11-23 LAB — COMPREHENSIVE METABOLIC PANEL
ALT: 24 U/L (ref 0–44)
AST: 34 U/L (ref 15–41)
Albumin: 3.8 g/dL (ref 3.5–5.0)
Alkaline Phosphatase: 64 U/L (ref 38–126)
Anion gap: 12 (ref 5–15)
BUN: 22 mg/dL (ref 8–23)
CO2: 21 mmol/L — ABNORMAL LOW (ref 22–32)
Calcium: 9.6 mg/dL (ref 8.9–10.3)
Chloride: 109 mmol/L (ref 98–111)
Creatinine, Ser: 1.79 mg/dL — ABNORMAL HIGH (ref 0.44–1.00)
GFR, Estimated: 28 mL/min — ABNORMAL LOW (ref >60)
Glucose, Bld: 179 mg/dL — ABNORMAL HIGH (ref 70–99)
Potassium: 3.7 mmol/L (ref 3.5–5.1)
Sodium: 142 mmol/L (ref 135–145)
Total Bilirubin: 0.9 mg/dL (ref 0.3–1.2)
Total Protein: 6.9 g/dL (ref 6.5–8.1)

## 2022-11-23 LAB — LIPASE, BLOOD: Lipase: 32 U/L (ref 11–51)

## 2022-11-23 LAB — DIFFERENTIAL
Abs Immature Granulocytes: 0.15 10*3/uL — ABNORMAL HIGH (ref 0.00–0.07)
Basophils Absolute: 0.1 10*3/uL (ref 0.0–0.1)
Basophils Relative: 0 %
Eosinophils Absolute: 0 10*3/uL (ref 0.0–0.5)
Eosinophils Relative: 0 %
Immature Granulocytes: 1 %
Lymphocytes Relative: 11 %
Lymphs Abs: 1.3 10*3/uL (ref 0.7–4.0)
Monocytes Absolute: 0.9 10*3/uL (ref 0.1–1.0)
Monocytes Relative: 8 %
Neutro Abs: 9.1 10*3/uL — ABNORMAL HIGH (ref 1.7–7.7)
Neutrophils Relative %: 80 %

## 2022-11-23 LAB — HEPARIN LEVEL (UNFRACTIONATED)
Heparin Unfractionated: 0.29 IU/mL — ABNORMAL LOW (ref 0.30–0.70)
Heparin Unfractionated: <0.1 IU/mL — ABNORMAL LOW (ref 0.30–0.70)

## 2022-11-23 LAB — PROTIME-INR
INR: 1 (ref 0.8–1.2)
Prothrombin Time: 13 seconds (ref 11.4–15.2)

## 2022-11-23 LAB — URINE DRUG SCREEN, QUALITATIVE (ARMC ONLY)
Amphetamines, Ur Screen: NOT DETECTED
Barbiturates, Ur Screen: NOT DETECTED
Benzodiazepine, Ur Scrn: NOT DETECTED
Cannabinoid 50 Ng, Ur ~~LOC~~: NOT DETECTED
Cocaine Metabolite,Ur ~~LOC~~: NOT DETECTED
MDMA (Ecstasy)Ur Screen: NOT DETECTED
Methadone Scn, Ur: NOT DETECTED
Opiate, Ur Screen: NOT DETECTED
Phencyclidine (PCP) Ur S: NOT DETECTED
Tricyclic, Ur Screen: NOT DETECTED

## 2022-11-23 LAB — PROCALCITONIN: Procalcitonin: <0.1 ng/mL

## 2022-11-23 LAB — BRAIN NATRIURETIC PEPTIDE: B Natriuretic Peptide: 87.7 pg/mL (ref 0.0–100.0)

## 2022-11-23 LAB — CBG MONITORING, ED: Glucose-Capillary: 139 mg/dL — ABNORMAL HIGH (ref 70–99)

## 2022-11-23 LAB — ETHANOL: Alcohol, Ethyl (B): <10 mg/dL (ref <10)

## 2022-11-23 LAB — LACTIC ACID, PLASMA: Lactic Acid, Venous: 1.9 mmol/L (ref 0.5–1.9)

## 2022-11-23 LAB — GLUCOSE, CAPILLARY
Glucose-Capillary: 67 mg/dL — ABNORMAL LOW (ref 70–99)
Glucose-Capillary: 119 mg/dL — ABNORMAL HIGH (ref 70–99)

## 2022-11-23 LAB — TYPE AND SCREEN
ABO/RH(D): O POS
Antibody Screen: NEGATIVE

## 2022-11-23 LAB — APTT: aPTT: 26 seconds (ref 24–36)

## 2022-11-23 MED ORDER — STROKE: EARLY STAGES OF RECOVERY BOOK: Freq: Once | Status: AC

## 2022-11-23 MED ORDER — HEPARIN (PORCINE) 25000 UT/250ML-% IV SOLN
750.0000 [IU]/h | INTRAVENOUS | Status: DC
  Administered 2022-11-23: 700 [IU]/h via INTRAVENOUS
  Filled 2022-11-23: qty 250

## 2022-11-23 MED ORDER — PANTOPRAZOLE SODIUM 40 MG PO TBEC: 40.0000 mg | DELAYED_RELEASE_TABLET | ORAL | Status: DC

## 2022-11-23 MED ORDER — ACETAMINOPHEN 650 MG RE SUPP: 650.0000 mg | RECTAL | Status: DC | PRN

## 2022-11-23 MED ORDER — DEXTROSE 50 % IV SOLN
12.5000 g | INTRAVENOUS | Status: AC
  Administered 2022-11-23: 12.5 g via INTRAVENOUS
  Filled 2022-11-23: qty 50

## 2022-11-23 MED ORDER — CLOPIDOGREL BISULFATE 75 MG PO TABS: 75.0000 mg | ORAL_TABLET | Freq: Every day | ORAL | Status: DC

## 2022-11-23 MED ORDER — SODIUM CHLORIDE 0.9 % IV SOLN
12.5000 mg | Freq: Three times a day (TID) | INTRAVENOUS | Status: DC | PRN
  Filled 2022-11-23: qty 0.5

## 2022-11-23 MED ORDER — MORPHINE SULFATE (PF) 2 MG/ML IV SOLN: 0.5000 mg | INTRAVENOUS | Status: DC | PRN

## 2022-11-23 MED ORDER — PANTOPRAZOLE INFUSION (NEW) - SIMPLE MED
8.0000 mg/h | INTRAVENOUS | Status: DC
  Filled 2022-11-23: qty 100

## 2022-11-23 MED ORDER — SODIUM CHLORIDE 0.9 % IV SOLN
3.0000 g | Freq: Once | INTRAVENOUS | Status: AC
  Administered 2022-11-23: 3 g via INTRAVENOUS
  Filled 2022-11-23: qty 8

## 2022-11-23 MED ORDER — ONDANSETRON HCL 4 MG/2ML IJ SOLN
4.0000 mg | Freq: Three times a day (TID) | INTRAMUSCULAR | Status: DC | PRN
  Administered 2022-11-23 – 2022-11-25 (×2): 4 mg via INTRAVENOUS
  Filled 2022-11-23 (×2): qty 2

## 2022-11-23 MED ORDER — HEPARIN (PORCINE) 25000 UT/250ML-% IV SOLN: 14.0000 [IU]/kg/h | INTRAVENOUS | Status: DC

## 2022-11-23 MED ORDER — SENNOSIDES-DOCUSATE SODIUM 8.6-50 MG PO TABS: 1.0000 | ORAL_TABLET | Freq: Every evening | ORAL | Status: DC | PRN

## 2022-11-23 MED ORDER — LEVOTHYROXINE SODIUM 50 MCG PO TABS
50.0000 ug | ORAL_TABLET | Freq: Every day | ORAL | Status: DC
  Administered 2022-11-25 – 2022-11-27 (×3): 50 ug via ORAL
  Filled 2022-11-23 (×3): qty 1

## 2022-11-23 MED ORDER — FOLIC ACID 1 MG PO TABS
1.0000 mg | ORAL_TABLET | Freq: Every day | ORAL | Status: DC
  Administered 2022-11-25 – 2022-11-27 (×3): 1 mg via ORAL
  Filled 2022-11-23 (×3): qty 1

## 2022-11-23 MED ORDER — SODIUM CHLORIDE 0.9 % IV SOLN: INTRAVENOUS | Status: DC

## 2022-11-23 MED ORDER — MONTELUKAST SODIUM 10 MG PO TABS
10.0000 mg | ORAL_TABLET | Freq: Every evening | ORAL | Status: DC
  Administered 2022-11-26: 10 mg via ORAL
  Filled 2022-11-23 (×2): qty 1

## 2022-11-23 MED ORDER — ALBUTEROL SULFATE (2.5 MG/3ML) 0.083% IN NEBU: 2.5000 mg | INHALATION_SOLUTION | RESPIRATORY_TRACT | Status: DC | PRN

## 2022-11-23 MED ORDER — PANTOPRAZOLE 80MG IVPB - SIMPLE MED
80.0000 mg | Freq: Once | INTRAVENOUS | Status: DC
  Administered 2022-11-23: 80 mg via INTRAVENOUS
  Filled 2022-11-23: qty 100

## 2022-11-23 MED ORDER — METOCLOPRAMIDE HCL 5 MG/ML IJ SOLN
5.0000 mg | Freq: Once | INTRAMUSCULAR | Status: AC
  Administered 2022-11-23: 5 mg via INTRAVENOUS
  Filled 2022-11-23: qty 2

## 2022-11-23 MED ORDER — ADULT MULTIVITAMIN W/MINERALS CH
1.0000 | ORAL_TABLET | Freq: Every day | ORAL | Status: DC
  Administered 2022-11-27: 1 via ORAL
  Filled 2022-11-23 (×3): qty 1

## 2022-11-23 MED ORDER — IOHEXOL 350 MG/ML SOLN
100.0000 mL | Freq: Once | INTRAVENOUS | Status: AC | PRN
  Administered 2022-11-23: 100 mL via INTRAVENOUS

## 2022-11-23 MED ORDER — PANTOPRAZOLE SODIUM 40 MG IV SOLR: 40.0000 mg | Freq: Two times a day (BID) | INTRAVENOUS | Status: DC

## 2022-11-23 MED ORDER — DM-GUAIFENESIN ER 30-600 MG PO TB12: 1.0000 | ORAL_TABLET | Freq: Two times a day (BID) | ORAL | Status: DC | PRN

## 2022-11-23 MED ORDER — HYDRALAZINE HCL 20 MG/ML IJ SOLN: 5.0000 mg | INTRAMUSCULAR | Status: DC | PRN

## 2022-11-23 MED ORDER — SODIUM CHLORIDE 0.9 % IV SOLN
3.0000 g | Freq: Two times a day (BID) | INTRAVENOUS | Status: DC
  Administered 2022-11-23 – 2022-11-25 (×4): 3 g via INTRAVENOUS
  Filled 2022-11-23 (×4): qty 8

## 2022-11-23 MED ORDER — SODIUM CHLORIDE 0.9 % IV BOLUS
1000.0000 mL | Freq: Once | INTRAVENOUS | Status: AC
  Administered 2022-11-23: 1000 mL via INTRAVENOUS

## 2022-11-23 MED ORDER — ONDANSETRON HCL 4 MG/2ML IJ SOLN
INTRAMUSCULAR | Status: AC
  Filled 2022-11-23: qty 2

## 2022-11-23 MED ORDER — IOHEXOL 9 MG/ML PO SOLN: 500.0000 mL | ORAL | Status: AC

## 2022-11-23 MED ORDER — PANTOPRAZOLE SODIUM 40 MG IV SOLR
40.0000 mg | Freq: Two times a day (BID) | INTRAVENOUS | Status: DC
  Administered 2022-11-23 – 2022-11-27 (×8): 40 mg via INTRAVENOUS
  Filled 2022-11-23 (×8): qty 10

## 2022-11-23 MED ORDER — ACETAMINOPHEN 160 MG/5ML PO SOLN: 650.0000 mg | ORAL | Status: DC | PRN

## 2022-11-23 MED ORDER — ATORVASTATIN CALCIUM 20 MG PO TABS
40.0000 mg | ORAL_TABLET | Freq: Every day | ORAL | Status: DC
  Administered 2022-11-25 – 2022-11-27 (×3): 40 mg via ORAL
  Filled 2022-11-23 (×3): qty 2

## 2022-11-23 MED ORDER — ACETAMINOPHEN 325 MG PO TABS: 650.0000 mg | ORAL_TABLET | ORAL | Status: DC | PRN

## 2022-11-23 MED ORDER — ONDANSETRON HCL 4 MG/2ML IJ SOLN: 4.0000 mg | Freq: Once | INTRAMUSCULAR | Status: AC

## 2022-11-23 NOTE — Consult Note (Signed)
Neurology Consultation Reason for Consult: c/f stroke Requesting Physician: Ivor Costa  CC: Syncope and generalized weakness, worsening L sided weakness   History is obtained from: Chart review, husband at bedside  HPI: [name] is a 81 y.o. female with a past medical history significant for right thalamic and corona radiata stroke (August 18, 2022), hypertension, hyperlipidemia, asthma, congestive heart failure, pancreatitis, anxiety/depression, one remote seizure not on any antiseizure medications.  Patient is minimally participatory in evaluation.  Very sleepy.  Per husband she was doing well until about 1-1/24-monthago when she had the flu and since then she has had great deal of fatigue.  For last 2 or 3 weeks she has been having a lot of nausea whenever she is ambulating.  Yesterday she slept at about 2 PM and woke up at about 6 PM feeling hungry, went to use the bathroom and afterwards felt faint and was assisted to the floor.  Subsequently she was felt to be very very weak on the left side for which code stroke was activated.  She was not felt to be a TNK candidate but given new occlusion of the right vert on CTA she was started on heparin drip.  Her prior stroke was felt to be small vessel disease related, but the possibility of embolic phenomenon is also on the differential given the size and multifocal appearance  LKW: days to weeks prior to admission Thrombolytic given?: No, out of the window IA performed?: No,  Premorbid modified rankin scale:      3 - Moderate disability. Requires some help, but able to walk unassisted.   ROS: Unable to obtain due to patient fatigue, but per husband pertinent ROS as above  Past Medical History:  Diagnosis Date   Anemia    Arthritis    Asthma    uses inhaler just prior to surgery to avoid attack   Back pain    from previous injury   Complication of anesthesia    has woken  up during 2 different surgery   Depression    no current  issue/treatment; situation   Gallstones    GERD (gastroesophageal reflux disease)    Hiatal hernia    patient does NOT have nerve/muscle disease   History of kidney stones    HLD (hyperlipidemia)    HTN (hypertension)    Hypothyroidism    Kidney stones    Knee pain    Non-diabetic pancreatic hormone dysfunction years   pt. 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   Pancreatitis    Pneumonia    Seizures (HCC)    caused by dye injected during a procedure   Shortness of breath    with exertion   Sinus problem    frequent infections/congestion   Stroke ([place] 2021   reports having CVA in 2021 and having mini strokes before that   Thyroid disease    Past Surgical History:  Procedure Laterality Date   ABDOMINAL HYSTERECTOMY     APPENDECTOMY     CARPAL TUNNEL RELEASE  10+ years ago   bilateral   EYE SURGERY  3 yrs ago   bilateral cataracts   FOOT OSTEOTOMY  6 weeks ago   Left foot: great, 2nd & 3rd   FOOT OSTEOTOMY  5 years ago   Right great toe   HAND SURGERY Bilateral 2011-most recent   multiple hand surgeries, 2 on left, 3 on right   KNEE ARTHROPLASTY Right 04/28/2022   Procedure: COMPUTER ASSISTED TOTAL KNEE ARTHROPLASTY;  Surgeon: Dereck Leep, MD;  Location: [facility];  Service: Orthopedics;  Laterality: Right;   LOOP RECORDER INSERTION N/A 05/16/2020   Procedure: LOOP RECORDER INSERTION;  Surgeon: Isaias Cowman, MD;  Location: [facility];  Service: Cardiovascular;  Laterality: N/A;   NASAL SINUS SURGERY  most recent 7-8 yrs ago   7 sinus surgeries    TRIGGER FINGER RELEASE  11/19/2011   Procedure: RELEASE TRIGGER FINGER/A-1 PULLEY;  Surgeon: Wynonia Sours, MD;  Location: [facility];  Service: Orthopedics;  Laterality: Right;  release a-1 pulley right index finger and cyst removal   WRIST GANGLION EXCISION  1980's   right   Current Outpatient Medications  Medication Instructions   albuterol (PROVENTIL HFA;VENTOLIN HFA) 108 (90 Base)  MCG/ACT inhaler 2 puffs, Inhalation, Every 4 hours PRN   amLODipine (NORVASC) 10 mg, Oral, Daily   aspirin EC 81 mg, Oral, Daily, Swallow whole.   atorvastatin (LIPITOR) 40 mg, Oral, Daily   clopidogrel (PLAVIX) 75 mg, Oral, Daily   diclofenac Sodium (VOLTAREN) 2 g, Topical, 3 times daily   DULoxetine (CYMBALTA) 30 mg, Oral, Daily   folic acid (FOLVITE) 284 mcg, Oral, Daily   gabapentin (NEURONTIN) 300 mg, Oral, 3 times daily   levothyroxine (SYNTHROID) 50 mcg, Oral, Daily before breakfast   losartan (COZAAR) 50 mg, Oral, Daily   montelukast (SINGULAIR) 10 mg, Oral, Daily   Multiple Vitamins-Minerals (MULTIVITAMIN GUMMIES ADULT) CHEW 2 capsules, Oral, Daily   pantoprazole (PROTONIX) 40 mg, Oral, See admin instructions   potassium chloride (KLOR-CON M10) 10 MEQ tablet 20 mEq, Oral, 2 times daily   predniSONE (DELTASONE) 10 mg, Oral, Daily   propranolol ER (INDERAL LA) 60 mg, Oral, Daily   Per husband on Plavix monotherapy at this time   Family History  Problem Relation Age of Onset   Anesthesia problems Father        "bad lungs" couldn't wake him up   Heart disease Father    Heart attack Father 64   Breast cancer Maternal Grandmother    Breast cancer Maternal Aunt        x 2   Breast cancer Cousin    Pancreatic cancer Cousin    Heart attack Paternal Uncle    Stroke Paternal Grandfather     Social History:  reports that she has never smoked. She has never used smokeless tobacco. She reports that she does not currently use alcohol. She reports that she does not use drugs.   Exam: Current vital signs: BP 137/69   Pulse 95   Temp 98 F (36.7 C) (Oral)   Resp (!) 22   Ht '4\' 11"'$  (1.499 m)   Wt 48.4 kg   SpO2 97%   BMI 21.55 kg/m  Vital signs in last 24 hours: Temp:  [98 F (36.7 C)] 98 F (36.7 C) (12/17 0207) Pulse Rate:  [74-102] 95 (12/17 0700) Resp:  [14-25] 22 (12/17 0700) BP: (104-152)/(64-83) 137/69 (12/17 0700) SpO2:  [91 %-100 %] 97 % (12/17 0700) Weight:   [48.4 kg] 48.4 kg (12/17 0145)   Physical Exam  Constitutional: Appears very fatigued Psych: Irritable, poorly cooperative with examination Eyes: No scleral injection HENT: No oropharyngeal obstruction.  Allodynia to touch of the left upper forehead, chronic secondary to prior shingles infection MSK: no joint deformities.  Cardiovascular: Perfusing extremities well Respiratory: Effort normal, non-labored breathing GI: Defer to primary team evaluation, noting patient has been having tenderness vomiting Skin: Some bruising on the shins  Neuro:  Mental Status: Patient is awake, alert, oriented to person, place, month, year, and situation. No signs of aphasia does appear to have some mild neglect of the left side Cranial Nerves: II: Visual Fields are full. Pupils are equal, round, and reactive to light, within limits of her allowing examination III,IV, VI: Orients to examiner bilaterally.  Does not sustain eye opening long enough to fully assess nystagmus V: Facial sensation is notable for allodynia to the left upper face VII: Facial movement is notable for moderate left facial droop VIII: hearing is intact to voice X: Uvula elevates symmetrically XI: Shoulder shrug is symmetric. XII: tongue is midline without atrophy or fasciculations.  Motor: She does not participate in confrontational strength testing.  Initially she left all extremities dropped quickly to the bed.  However with encouragement and repeated stimulation she maintains both upper extremities antigravity though she does have some finger curling of the left upper extremity.  She also maintains both lower extremities antigravity without drift Sensory: Sensation is reduced on the left arm and leg Cerebellar: FNF and HKS are intact bilaterally Gait:  Deferred in acute setting   NIHSS total 5 Score breakdown: 1 point for drowsiness, 2 points for left facial droop, 1-point for sensory changes on the left side, 1-point for  dysarthria Performed at 3:25 PM   I have reviewed labs in epic and the results pertinent to this consultation are:  Basic Metabolic Panel: Recent Labs  Lab 11/23/22 0204  NA 142  K 3.7  CL 109  CO2 21*  GLUCOSE 179*  BUN 22  CREATININE 1.79*  CALCIUM 9.6    CBC: Recent Labs  Lab 11/23/22 0204  WBC 11.6*  NEUTROABS 9.1*  HGB 10.5*  HCT 34.0*  MCV 92.9  PLT 546*    Coagulation Studies: Recent Labs    11/23/22 0204  LABPROT 13.0  INR 1.0      I have reviewed the images obtained:  CTA head/neck 12/17 personally reviewed, agree with radiology:   1. 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 2. No other intracranial arterial occlusion or high-grade stenosis. 3. 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  Head CT 12/17 personally reviewed, agree with radiology:   1. No acute intracranial abnormality. 2. 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 3. ASPECTS is 10.  CT abdomen/pelvis: 1. 8.4 cm mass-like inflammatory/edematous changes in the anterior pelvic fat cephalad to the urinary bladder, possibly contusion if history of regional trauma; neoplasm eg liposarcoma would be a less common etiology. 2. Colonic diverticulosis. 3. Small hiatal hernia. 4.  Aortic Atherosclerosis (ICD10-I70.0).  CXR Mild patchy/interstitial opacities, right upper lobe predominant, suspicious for mild pneumonia.  Assessment: Discussed with neuroradiology I do not feel that the occlusion of the right vert appears to be a dissection, atheroembolic also seems less likely given comparison to prior CTA with minimal atherosclerotic disease.  Therefore I favor this to have been cardioembolic phenomenon.  Additionally, with the chronicity of her symptoms I do not feel that this likely explains her acute nausea and vomiting as these have been ongoing for some time.  Certainly with the report of  coffee-ground emesis by bedside nursing, I do not feel that the risks of heparin are outweighed by the benefits at this time, and will discontinue it for now, especially noting that her hemoglobin has dropped by 2 points since her last admission.  With her generalized difficulty when ambulating we will  also obtain an MRI of her cervical spine to confirm that the occlusive lesion is not having a significant effect on her cord  Impression:  - AKI with creatinine of 1.79 and baseline of 0.91 - Abdominal mass on CT imaging - Nausea  - Right Vert occlusion, query embolic  - Acute thrombocytosis to 546 with baseline platelets 328, hemoglobin dropped from 12-10.5, stable leukocytosis of 11.6  Recommendations: -MRI brain without contrast -MRI C-spine without contrast -TTE, consider TEE if negative and no other source of embolic phenomenon discovered -Stop heparin drip for now -Hold antiplatelet for now pending medical clearance (note home antiplatelet is plavix monotherapy)  -Appreciate workup of abdominal pain, nausea, vomiting, abdominal mass, coffee-ground emesis, anemia per primary team and management of comborbidities -Neurology will follow along  Lesleigh Noe MD-PhD [facility] [phone] [facility] coverage for [facility] is from 8 AM to 4 AM in-house and 4 PM to 8 PM by telephone/video. 8 PM to 8 AM emergent questions or overnight urgent questions should be addressed to Teleneurology On-call or [facility] neurohospitalist; contact information can be found on AMION

## 2022-11-23 NOTE — ED Notes (Signed)
Pt resting in bed, husband at bedside. 

## 2022-11-23 NOTE — ED Notes (Signed)
Activate Code Stroke w/[facility], spoke to [place]

## 2022-11-23 NOTE — Progress Notes (Signed)
Pharmacy Antibiotic Note  [name] is a 81 y.o. female admitted on 11/23/2022 with CVA now w/ aspiration pneumonia.  Pharmacy has been consulted for Unasyn dosing.  Plan: start Unasyn 3 grams IV every 12 hours ---follow renal function for needed dose adjustments  Height: '4\' 11"'$  (149.9 cm) Weight: 48.4 kg (106 lb 11.2 oz) IBW/kg (Calculated) : 43.2  Temp (24hrs), Avg:98 F (36.7 C), Min:98 F (36.7 C), Max:98 F (36.7 C)  Recent Labs  Lab 11/23/22 0204 11/23/22 0412  WBC 11.6*  --   CREATININE 1.79*  --   LATICACIDVEN  --  1.9    Estimated Creatinine Clearance: 17.1 mL/min (A) (by C-G formula based on SCr of 1.79 mg/dL (H)).    Allergies  Allergen Reactions   Cinobac [Cinoxacin] Anaphylaxis    "My throat swelled and I stopped breathing."   Latex Dermatitis    IgE = 14 (WNL) on 04/17/2022 Pt is highly reactive to any latex products. Blisters skin. "Takes my skin off"   Other Shortness Of Breath    Muscle relaxers? Pt thinks allergic to muscle relaxers, reports stops breathing but not sure what medicine and if for sure muscle relaxer   Petrolatum Distillates [Petroleum Distillate] Shortness Of Breath    Passes out    Sulfa Antibiotics Itching   Nylon Itching    Antimicrobials this admission: 12/17 Unasyn >>   Microbiology results: 12/17 BCx: pending 12/17 Sputum: pending   Thank you for allowing pharmacy to be a part of this patient's care.  [name] 11/23/2022 8:33 AM

## 2022-11-23 NOTE — Progress Notes (Signed)
PT Cancellation Note  Patient Details Name: [name] MRN: [id number] DOB: 02-23-1941   Cancelled Treatment:    Reason Eval/Treat Not Completed: Medical issues which prohibited therapy. Immediately upon rolling over in bed patient began vomiting. (3x). RN notified. Will hold eval for now and re-attempt tomorrow as able.    Deirdra Heumann 11/23/2022, 1:37 PM

## 2022-11-23 NOTE — Progress Notes (Signed)
[name] for heparin infusion Indication: stroke/CVA  Allergies  Allergen Reactions   Cinobac [Cinoxacin] Anaphylaxis    "My throat swelled and I stopped breathing."   Latex Dermatitis    IgE = 14 (WNL) on 04/17/2022 Pt is highly reactive to any latex products. Blisters skin. "Takes my skin off"   Other Shortness Of Breath    Muscle relaxers? Pt thinks allergic to muscle relaxers, reports stops breathing but not sure what medicine and if for sure muscle relaxer   Petrolatum Distillates [Petroleum Distillate] Shortness Of Breath    Passes out    Sulfa Antibiotics Itching   Meloxicam     Other Reaction(s): Not available, Unknown   Nylon Itching    Patient Measurements: Height: '4\' 11"'$  (149.9 cm) Weight: 48.4 kg (106 lb 11.2 oz) IBW/kg (Calculated) : 43.2 Heparin Dosing Weight: 48.4 kg  Vital Signs: Temp: 98.2 F (36.8 C) (12/17 1100) Temp Source: Oral (12/17 1100) BP: 132/67 (12/17 1100) Pulse Rate: 90 (12/17 1100)  Labs: Recent Labs    11/23/22 0204 11/23/22 0412 11/23/22 1248  HGB 10.5*  --   --   HCT 34.0*  --   --   PLT 546*  --   --   APTT 26  --   --   LABPROT 13.0  --   --   INR 1.0  --   --   HEPARINUNFRC  --   --  0.29*  CREATININE 1.79*  --   --   TROPONINIHS 7 10  --      Estimated Creatinine Clearance: 17.1 mL/min (A) (by C-G formula based on SCr of 1.79 mg/dL (H)).   Medical History: Past Medical History:  Diagnosis Date   Anemia    Arthritis    Asthma    uses inhaler just prior to surgery to avoid attack   Back pain    from previous injury   Complication of anesthesia    has woken  up during 2 different surgery   Depression    no current issue/treatment; situation   Gallstones    GERD (gastroesophageal reflux disease)    Hiatal hernia    patient does NOT have nerve/muscle disease   History of kidney stones    HLD (hyperlipidemia)    HTN (hypertension)    Hypothyroidism    Kidney stones     Knee pain    Non-diabetic pancreatic hormone dysfunction years   pt. 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   Pancreatitis    Pneumonia    Seizures (HCC)    caused by dye injected during a procedure   Shortness of breath    with exertion   Sinus problem    frequent infections/congestion   Stroke ([place]) 2021   reports having CVA in 2021 and having mini strokes before that   Thyroid disease     Assessment: Pt is a 81 yo female presenting to ED due to syncope & left side weakness after recent stroke 3 months ago.  12/17 1248  HL=0.29     Subtherapeutic barely, 700>750  Goal of Therapy:  Heparin level 0.3-0.5 units/ml Monitor platelets by anticoagulation protocol: Yes   Plan:  No boluses 12/17 1248  HL=0.29     Subtherapeutic barely Increase heparin infusion to 750 units/hr Will recheck HL in 8 hr after rate change CBC daily while on heparin  Chinita Greenland PharmD Clinical Pharmacist 11/23/2022

## 2022-11-23 NOTE — ED Triage Notes (Signed)
Pt BIB [facility] from home. Woke up, had a syncope. Did not fall. Assisted to ground than fall. Stroke 3 months ago with left sided deficits which are baseline. Pt does feel more weak on left side than normal. Husband was unable to determine if new. Pt got nauseated and vomited upon arrival to ED.  18G LAC Vitals 133/68 80 95% RA ETCO2 normal EKG normal with PVCs

## 2022-11-23 NOTE — Plan of Care (Signed)
  Problem: Pain Managment: Goal: General experience of comfort will improve Outcome: Progressing   Problem: Safety: Goal: Ability to remain free from injury will improve Outcome: Progressing   Problem: Self-Care: Goal: Verbalization of feelings and concerns over difficulty with self-care will improve Outcome: Progressing

## 2022-11-23 NOTE — Consult Note (Signed)
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 Near syncopal episode yesterday, husband helped her to the ground. Dehydrated, belly pain with nausea/vomiting, unresponsive for 1-2 minutes. Did respond to painful stimuli. Recent stroke 9/15, hx of HTN. Stroke 3 months, L sided weakness in therapy and it worsened. CTA head/neck, STAT. Infectious/metabolic workup. MR brain, tele, stroke workup. Neurology following along.  Our recommendations are outlined below.  Recommendations:        Stroke/Telemetry Floor       Neuro Checks       Bedside Swallow Eval       DVT Prophylaxis       IV Fluids, Normal Saline       Head of Bed 30 Degrees       Euglycemia and Avoid Hyperthermia (PRN Acetaminophen)       Antihypertensives PRN if Blood pressure is greater than 220/120 or there is a concern for End organ damage/contraindications for permissive HTN. If blood pressure is greater than 220/120 give labetalol PO or IV or Vasotec IV with a goal of 15% reduction in BP during the first 24 hours.  Sign Out:       Discussed with Emergency Department Provider       Discussed with Rapid Response Team    ------------------------------------------------------------------------------  Advanced Imaging: Advanced imaging has been ordered. Results pending.   Metrics: Last Known Well: Unknown [facility] Notification Time: 11/23/2022 01:56:18 Arrival Time: 11/23/2022 01:38:00 Stamp Time: 11/23/2022 01:56:18 Initial Response Time: 11/23/2022 02:13:37 Symptoms: AMS. Initial patient interaction: 11/23/2022 02:22:24 NIHSS Assessment Completed:  11/23/2022 02:33:52 Patient is not a candidate for Thrombolytic. Thrombolytic Medical Decision: 11/23/2022 02:33:53 Patient was not deemed candidate for Thrombolytic because of following reasons: Last Well Known Above 4.5 Hours. Stroke in last 3 months.  CT head showed no acute hemorrhage or acute core infarct.  Primary Provider Notified of Diagnostic Impression and Management Plan on: 11/23/2022 03:01:00    ------------------------------------------------------------------------------  History of Present Illness: Patient is a 81 year old Female.  Patient was brought by EMS for symptoms of AMS. 81 year old woman, coming in with EMS for being drowsy since yesterday. Near syncopal episode yesterday, husband helped her to the ground. Dehydrated, belly pain with nausea/vomiting, unresponsive for 1-2 minutes. Did respond to painful stimuli. Recent stroke 9/15, hx of HTN. Stroke 3 months, L sided weakness in therapy and it worsened. Not a candidate for thrombolytics, given patient was not feeling well since yesterday AM and had been sleeping more, > 4.5h.   Past Medical History:      Hypertension      Hyperlipidemia      Coronary Artery Disease      Stroke  Medications:  No Anticoagulant use  Antiplatelet use: Yes plavix Reviewed EMR for current medications  Allergies:  Reviewed  Social History: Smoking: No Alcohol Use: No Drug Use: No  Family History:  There Is Family History [id number] There is no family history of premature cerebrovascular disease pertinent to this consultation  ROS : 14 Points Review of Systems was performed and was negative except mentioned in HPI.  Past Surgical History:  There Is No Surgical History Contributory To Today's Visit    Examination: BP(151/73), Pulse(100), Blood Glucose(139) 1A: Level of Consciousness - Alert; keenly responsive + 0 1B: Ask Month and Age - Both Questions Right + 0 1C: Blink Eyes & Squeeze Hands - Performs Both Tasks  + 0 2: Test Horizontal Extraocular Movements - Normal + 0 3: Test Visual Fields - No Visual Loss + 0 4: Test Facial Palsy (Use Grimace if Obtunded) - Minor paralysis (flat nasolabial fold, smile asymmetry) + 1 5A: Test Left Arm Motor Drift - Drift, but doesn't hit bed + 1 5B: Test Right Arm Motor Drift - No Drift for 10 Seconds + 0 6A: Test Left Leg Motor Drift - No Drift for 5 Seconds + 0 6B: Test Right Leg Motor Drift - Drift, but doesn't hit bed + 1 7: Test Limb Ataxia (FNF/Heel-Shin) - No Ataxia + 0 8: Test Sensation - Mild-Moderate Loss: Can Sense Being Touched + 1 9: Test Language/Aphasia - Mild-Moderate Aphasia: Some Obvious Changes, Without Significant Limitation + 1 10: Test Dysarthria - Severe Dysarthria: Unintelligble Slurring or Out of Proportion to Aphasia + 2 11: Test Extinction/Inattention - No abnormality + 0  NIHSS Score: 7   Pre-Morbid Modified Rankin Scale: 0 Points = No symptoms at all  Spoke with : Dr. Beather Arbour  Patient/Family was informed the Neurology Consult would occur via TeleHealth consult by way of interactive audio and video telecommunications and consented to receiving care in this manner.   Patient is being evaluated for possible acute neurologic impairment and high probability of imminent or life-threatening deterioration. I spent total of 35 minutes providing care to this patient, including time for face to face visit via telemedicine, review of medical records, imaging studies and discussion of findings with providers, the patient and/or family.   Dr Marry Guan   [facility] For Inpatient follow-up with [facility] physician please call [facility] [phone]. This is not an outpatient service. Post hospital discharge, please contact hospital directly.  Please do not communicate with [facility] physicians via secure chat. If you have any questions, Please contact [facility].

## 2022-11-23 NOTE — Significant Event (Signed)
81 yo w/recent thalamic CVA who presented with increased drowsiness and vomiting x 48h. 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 Teleneuro rec heparin with no bolus. Chest imaging suggesting aspiration pneumonia.  Patient DNR.

## 2022-11-23 NOTE — Consult Note (Addendum)
Inpatient Consultation   Patient ID: CHE BELOW is a 81 y.o. female.  Requesting Provider: Dr. Blaine Hamper  Date of Admission: 11/23/2022  Date of Consult: 11/23/22   Reason for Consultation: coffee ground emesis   Patient's Chief Complaint:   Chief Complaint  Patient presents with   Weakness    Ms. 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  GI is consulted for possible coffee-ground emesis.  Patient is minimally interactive and husband provides history/information in addition to chart review.  Patient was at home when husband noted that the patient was becoming weaker along with slurred speech, presyncope.  After that she had multiple episodes of nonbilious nonbloody vomiting.  She was brought to the hospital and found to have an acute stroke and neurology recommended IV heparin initiation.  At home the patient is on Plavix.  During her time in the hospital, the patient continued to have some dry heaving.  Nursing noted scant darker emesis without hematemesis or any signs of blood and GI was consulted.  At home the husband has noted no melena hematochezia hematemesis or coffee-ground emesis.  Her hemoglobin is at 10 and was at 12 a few months ago with her stroke. Despite this decrease, there has been no signs of GIB and pt has been hemodynamically stable.  Husband reports that the patient did not have any trouble swallowing or complaints of odynophagia  She is being initiated on protonix gtt in ed. husband notes the abdominal pain is when the patient sits up and moves around.  CT unremarkable for GI issues aside from small hiatal hernia and diverticulosis.  A mass near the urinary bladder was noted and surgery has been consulted.  Husband denies nsaid  use  Lipase, LFTs, BUN/creatinine ratio all within normal limits  Patient is otherwise resting in bed comfortably and denies abdominal pain at this current time.  Denies NSAIDs, Anti-plt agents, and anticoagulants Denies family history of gastrointestinal disease and malignancy Previous Endoscopies: Husband thinks she has had egd, but not colonoscopy     Past Medical History:  Diagnosis Date   Anemia    Arthritis    Asthma    uses inhaler just prior to surgery to avoid attack   Back pain    from previous injury   Complication of anesthesia    has woken  up during 2 different surgery   Depression    no current issue/treatment; situation   Gallstones    GERD (gastroesophageal reflux disease)    Hiatal hernia    patient does NOT have nerve/muscle disease   History of kidney stones    HLD (hyperlipidemia)    HTN (hypertension)    Hypothyroidism    Kidney stones    Knee pain    Non-diabetic pancreatic hormone dysfunction years   pt. 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   Pancreatitis    Pneumonia    Seizures ([place])    caused by dye injected during a procedure   Shortness of breath    with exertion   Sinus problem    frequent infections/congestion   Stroke ([place]) 2021   reports having CVA in 2021 and having mini strokes before that   Thyroid disease     Past Surgical History:  Procedure Laterality Date   ABDOMINAL HYSTERECTOMY  APPENDECTOMY     CARPAL TUNNEL RELEASE  10+ years ago   bilateral   EYE SURGERY  3 yrs ago   bilateral cataracts   FOOT OSTEOTOMY  6 weeks ago   Left foot: great, 2nd & 3rd   FOOT OSTEOTOMY  5 years ago   Right great toe   HAND SURGERY Bilateral 2011-most recent   multiple hand surgeries, 2 on left, 3 on right   KNEE ARTHROPLASTY Right 04/28/2022   Procedure: COMPUTER ASSISTED TOTAL KNEE ARTHROPLASTY;  Surgeon: Dereck Leep, MD;  Location: [facility];  Service: Orthopedics;  Laterality: Right;   LOOP RECORDER INSERTION N/A  05/16/2020   Procedure: LOOP RECORDER INSERTION;  Surgeon: Isaias Cowman, MD;  Location: [facility];  Service: Cardiovascular;  Laterality: N/A;   NASAL SINUS SURGERY  most recent 7-8 yrs ago   7 sinus surgeries    TRIGGER FINGER RELEASE  11/19/2011   Procedure: RELEASE TRIGGER FINGER/A-1 PULLEY;  Surgeon: Wynonia Sours, MD;  Location: [facility];  Service: Orthopedics;  Laterality: Right;  release a-1 pulley right index finger and cyst removal   WRIST GANGLION EXCISION  1980's   right    Allergies  Allergen Reactions   Cinobac [Cinoxacin] Anaphylaxis    "My throat swelled and I stopped breathing."   Latex Dermatitis    IgE = 14 (WNL) on 04/17/2022 Pt is highly reactive to any latex products. Blisters skin. "Takes my skin off"   Other Shortness Of Breath    Muscle relaxers? Pt thinks allergic to muscle relaxers, reports stops breathing but not sure what medicine and if for sure muscle relaxer   Petrolatum Distillates [Petroleum Distillate] Shortness Of Breath    Passes out    Sulfa Antibiotics Itching   Meloxicam     Other Reaction(s): Not available, Unknown   Nylon Itching    Family History  Problem Relation Age of Onset   Anesthesia problems Father        "bad lungs" couldn't wake him up   Heart disease Father    Heart attack Father 23   Breast cancer Maternal Grandmother    Breast cancer Maternal Aunt        x 2   Breast cancer Cousin    Pancreatic cancer Cousin    Heart attack Paternal Uncle    Stroke Paternal Grandfather     Social History   Tobacco Use   Smoking status: Never   Smokeless tobacco: Never  Vaping Use   Vaping Use: Never used  Substance Use Topics   Alcohol use: Not Currently    Alcohol/week: 0.0 - 1.0 standard drinks of alcohol    Comment: wine once every 2-3 months   Drug use: Never     Pertinent GI related history and allergies were reviewed with the patient  Review of Systems  Unable to perform ROS: Mental  status change (information garnered via husband)  HENT:  Negative for trouble swallowing.   Gastrointestinal:  Positive for nausea and vomiting. Negative for abdominal distention, abdominal pain, blood in stool, constipation and diarrhea.  Neurological:  Positive for speech difficulty and weakness.     Medications Home Medications No current facility-administered medications on file prior to encounter.   Current Outpatient Medications on File Prior to Encounter  Medication Sig Dispense Refill   amLODipine (NORVASC) 10 MG tablet TAKE 1 TABLET BY MOUTH EVERY DAY 90 tablet 0   clopidogrel (PLAVIX) 75 MG tablet Take 1 tablet (75 mg  total) by mouth daily. 6 tablet 0   DULoxetine (CYMBALTA) 30 MG capsule Take 1 capsule (30 mg total) by mouth daily. 30 capsule 0   folic acid (FOLVITE) 536 MCG tablet Take 1 tablet (800 mcg total) by mouth daily. 30 tablet 0   gabapentin (NEURONTIN) 300 MG capsule Take 1 capsule (300 mg total) by mouth 3 (three) times daily. 90 capsule 0   levothyroxine (SYNTHROID) 50 MCG tablet Take 1 tablet (50 mcg total) by mouth daily before breakfast. 30 tablet 0   losartan (COZAAR) 50 MG tablet TAKE 1 TABLET BY MOUTH EVERY DAY 90 tablet 0   Multiple Vitamins-Minerals (MULTIVITAMIN GUMMIES ADULT) CHEW Chew 2 capsules by mouth daily.     pantoprazole (PROTONIX) 40 MG tablet TAKE 1 TABLET (40 MG TOTAL) BY MOUTH SEE ADMIN INSTRUCTIONS. 90 tablet 0   potassium chloride (KLOR-CON M10) 10 MEQ tablet TAKE 2 TABLETS BY MOUTH 2 TIMES DAILY. 360 tablet 0   predniSONE (DELTASONE) 10 MG tablet Take 1 tablet (10 mg total) by mouth daily. 30 tablet 0   propranolol ER (INDERAL LA) 60 MG 24 hr capsule Take 1 capsule (60 mg total) by mouth daily. 30 capsule 0   albuterol (PROVENTIL HFA;VENTOLIN HFA) 108 (90 Base) MCG/ACT inhaler Inhale 2 puffs into the lungs every 4 (four) hours as needed for wheezing or shortness of breath.      aspirin EC 81 MG tablet Take 1 tablet (81 mg total) by mouth  daily. Swallow whole. (Patient not taking: Reported on 11/23/2022) 100 tablet 0   atorvastatin (LIPITOR) 40 MG tablet TAKE 1 TABLET BY MOUTH EVERY DAY 90 tablet 0   diclofenac Sodium (VOLTAREN) 1 % GEL Apply 2 g topically 3 (three) times daily. 350 g 0   montelukast (SINGULAIR) 10 MG tablet Take 1 tablet (10 mg total) by mouth daily. 30 tablet 0   Pertinent GI related medications were reviewed with the patient  Inpatient Medications  Current Facility-Administered Medications:    [START ON 11/24/2022]  stroke: early stages of recovery book, , Does not apply, Once, Ivor Costa, MD   0.9 %  sodium chloride infusion, , Intravenous, Continuous, Ivor Costa, MD, Last Rate: 75 mL/hr at 11/23/22 0852, New Bag at 11/23/22 0852   acetaminophen (TYLENOL) tablet 650 mg, 650 mg, Oral, Q4H PRN **OR** acetaminophen (TYLENOL) 160 MG/5ML solution 650 mg, 650 mg, Per Tube, Q4H PRN **OR** acetaminophen (TYLENOL) suppository 650 mg, 650 mg, Rectal, Q4H PRN, Ivor Costa, MD   albuterol (PROVENTIL) (2.5 MG/3ML) 0.083% nebulizer solution 2.5 mg, 2.5 mg, Nebulization, Q4H PRN, Ivor Costa, MD   Ampicillin-Sulbactam (UNASYN) 3 g in sodium chloride 0.9 % 100 mL IVPB, 3 g, Intravenous, Q12H, [name], RPH   atorvastatin (LIPITOR) tablet 40 mg, 40 mg, Oral, Daily, Ivor Costa, MD   dextromethorphan-guaiFENesin ([place] DM) 30-600 MG per 12 hr tablet 1 tablet, 1 tablet, Oral, BID PRN, Ivor Costa, MD   folic acid (FOLVITE) tablet 1 mg, 1 mg, Oral, Daily, Ivor Costa, MD   hydrALAZINE (APRESOLINE) injection 5 mg, 5 mg, Intravenous, Q2H PRN, Ivor Costa, MD   [START ON 11/24/2022] levothyroxine (SYNTHROID) tablet 50 mcg, 50 mcg, Oral, QAC breakfast, Ivor Costa, MD   montelukast (SINGULAIR) tablet 10 mg, 10 mg, Oral, QPM, Ivor Costa, MD   morphine (PF) 2 MG/ML injection 0.5 mg, 0.5 mg, Intravenous, Q3H PRN, Ivor Costa, MD   multivitamin with minerals tablet 1 tablet, 1 tablet, Oral, Daily, Ivor Costa, MD   ondansetron  [facility])  injection 4 mg, 4 mg, Intravenous, Q8H PRN, Ivor Costa, MD, 4 mg at 11/23/22 0915   pantoprazole (PROTONIX) 80 mg /NS 100 mL IVPB, 80 mg, Intravenous, Once, Ivor Costa, MD   Derrill Memo ON 11/27/2022] pantoprazole (PROTONIX) injection 40 mg, 40 mg, Intravenous, Q12H, Ivor Costa, MD   pantoprozole (PROTONIX) 80 mg /NS 100 mL infusion, 8 mg/hr, Intravenous, Continuous, Ivor Costa, MD   promethazine (PHENERGAN) 12.5 mg in sodium chloride 0.9 % 50 mL IVPB, 12.5 mg, Intravenous, Q8H PRN, Ivor Costa, MD   senna-docusate (Senokot-S) tablet 1 tablet, 1 tablet, Oral, QHS PRN, Ivor Costa, MD  Current Outpatient Medications:    amLODipine (NORVASC) 10 MG tablet, TAKE 1 TABLET BY MOUTH EVERY DAY, Disp: 90 tablet, Rfl: 0   clopidogrel (PLAVIX) 75 MG tablet, Take 1 tablet (75 mg total) by mouth daily., Disp: 6 tablet, Rfl: 0   DULoxetine (CYMBALTA) 30 MG capsule, Take 1 capsule (30 mg total) by mouth daily., Disp: 30 capsule, Rfl: 0   folic acid (FOLVITE) 193 MCG tablet, Take 1 tablet (800 mcg total) by mouth daily., Disp: 30 tablet, Rfl: 0   gabapentin (NEURONTIN) 300 MG capsule, Take 1 capsule (300 mg total) by mouth 3 (three) times daily., Disp: 90 capsule, Rfl: 0   levothyroxine (SYNTHROID) 50 MCG tablet, Take 1 tablet (50 mcg total) by mouth daily before breakfast., Disp: 30 tablet, Rfl: 0   losartan (COZAAR) 50 MG tablet, TAKE 1 TABLET BY MOUTH EVERY DAY, Disp: 90 tablet, Rfl: 0   Multiple Vitamins-Minerals (MULTIVITAMIN GUMMIES ADULT) CHEW, Chew 2 capsules by mouth daily., Disp: , Rfl:    pantoprazole (PROTONIX) 40 MG tablet, TAKE 1 TABLET (40 MG TOTAL) BY MOUTH SEE ADMIN INSTRUCTIONS., Disp: 90 tablet, Rfl: 0   potassium chloride (KLOR-CON M10) 10 MEQ tablet, TAKE 2 TABLETS BY MOUTH 2 TIMES DAILY., Disp: 360 tablet, Rfl: 0   predniSONE (DELTASONE) 10 MG tablet, Take 1 tablet (10 mg total) by mouth daily., Disp: 30 tablet, Rfl: 0   propranolol ER (INDERAL LA) 60 MG 24 hr capsule, Take 1 capsule (60  mg total) by mouth daily., Disp: 30 capsule, Rfl: 0   albuterol (PROVENTIL HFA;VENTOLIN HFA) 108 (90 Base) MCG/ACT inhaler, Inhale 2 puffs into the lungs every 4 (four) hours as needed for wheezing or shortness of breath. , Disp: , Rfl:    aspirin EC 81 MG tablet, Take 1 tablet (81 mg total) by mouth daily. Swallow whole. (Patient not taking: Reported on 11/23/2022), Disp: 100 tablet, Rfl: 0   atorvastatin (LIPITOR) 40 MG tablet, TAKE 1 TABLET BY MOUTH EVERY DAY, Disp: 90 tablet, Rfl: 0   diclofenac Sodium (VOLTAREN) 1 % GEL, Apply 2 g topically 3 (three) times daily., Disp: 350 g, Rfl: 0   montelukast (SINGULAIR) 10 MG tablet, Take 1 tablet (10 mg total) by mouth daily., Disp: 30 tablet, Rfl: 0  sodium chloride 75 mL/hr at 11/23/22 0852   ampicillin-sulbactam (UNASYN) IV     pantoprazole     pantoprazole     promethazine (PHENERGAN) injection (IM or IVPB)      acetaminophen **OR** acetaminophen (TYLENOL) oral liquid 160 mg/5 mL **OR** acetaminophen, albuterol, dextromethorphan-guaiFENesin, hydrALAZINE, morphine injection, ondansetron (ZOFRAN) IV, promethazine (PHENERGAN) injection (IM or IVPB), senna-docusate   Objective   Vitals:   11/23/22 1200 11/23/22 1300 11/23/22 1400 11/23/22 1500  BP: 133/68 130/61 (!) 126/52 (!) 129/59  Pulse: 88 90 84 87  Resp: (!) 24 16 (!) 30 19  Temp:  TempSrc:      SpO2: 98% 98% 99% 98%  Weight:      Height:         Physical Exam Vitals and nursing note reviewed.  Constitutional:      General: She is not in acute distress.    Appearance: She is ill-appearing. She is not toxic-appearing or diaphoretic.  HENT:     Head: Normocephalic and atraumatic.     Nose: Nose normal.  Eyes:     General: No scleral icterus.    Extraocular Movements: Extraocular movements intact.     Pupils: Pupils are equal, round, and reactive to light.     Comments: Opens eyes to comand  Cardiovascular:     Rate and Rhythm: Normal rate and regular rhythm.     Heart  sounds: Normal heart sounds. No murmur heard.    No friction rub. No gallop.  Pulmonary:     Effort: Pulmonary effort is normal. No respiratory distress.     Breath sounds: Normal breath sounds. No wheezing, rhonchi or rales.  Abdominal:     General: Bowel sounds are normal. There is no distension.     Palpations: Abdomen is soft.     Tenderness: There is no abdominal tenderness. There is no guarding or rebound.  Musculoskeletal:     Cervical back: Neck supple.     Right lower leg: No edema.     Left lower leg: No edema.  Skin:    General: Skin is warm and dry.     Coloration: Skin is not jaundiced or pale.  Neurological:     Comments: Follows commands. Not verbally interacting. Lethargic. Able to stick tongue out and open eyes. Nods yes and no to answer questions     Laboratory Data Recent Labs  Lab 11/23/22 0204  WBC 11.6*  HGB 10.5*  HCT 34.0*  PLT 546*   Recent Labs  Lab 11/23/22 0204  NA 142  K 3.7  CL 109  CO2 21*  BUN 22  CALCIUM 9.6  PROT 6.9  BILITOT 0.9  ALKPHOS 64  ALT 24  AST 34  GLUCOSE 179*   Recent Labs  Lab 11/23/22 0204  INR 1.0    Recent Labs    11/23/22 0412  LIPASE 32        Imaging Studies: CT ABDOMEN PELVIS WO CONTRAST  Result Date: 11/23/2022 CLINICAL DATA:  Abdominal pain, acute, nonlocalized EXAM: CT ABDOMEN AND PELVIS WITHOUT CONTRAST TECHNIQUE: Multidetector CT imaging of the abdomen and pelvis was performed following the standard protocol without IV contrast. RADIATION DOSE REDUCTION: This exam was performed according to the departmental dose-optimization program which includes automated exposure control, adjustment of the mA and/or kV according to patient size and/or use of iterative reconstruction technique. COMPARISON:  07/30/2004 by report only FINDINGS: Lower chest: No pleural or pericardial effusion. Small hiatal hernia. Visualized lung bases clear. Hepatobiliary: Hyperdense gallbladder contents probably vicarious  excretion of previously administered contrast material. No focal liver lesion or biliary ductal dilatation. Pancreas: Unremarkable. No pancreatic ductal dilatation or surrounding inflammatory changes. Spleen: Normal in size without focal abnormality. Adrenals/Urinary Tract: No adrenal mass. Mildly striated appearance of kidneys suggesting renal dysfunction. No hydronephrosis. Residual contrast material in the distended urinary bladder. Stomach/Bowel: Small hiatal hernia involving gastric fundus. Small bowel decompressed. Appendix not identified. The colon is incompletely distended by gas and fecal material with scattered diverticula; no adjacent inflammatory/edematous change. Vascular/Lymphatic: Scattered aortoiliac calcified atheromatous plaque without aneurysm. No abdominal or pelvic adenopathy. Reproductive:  Post hysterectomy. 4.8 cm simple appearing left lower quadrant cyst abutting distal sigmoid colon, described on previous report of 07/30/2004 measured 4 cm, presumably benign adnexal or duplication cyst given significant stability over time. Other: No ascites.  No free air. Musculoskeletal: Poorly marginated streaky inflammatory/edematous changes in 8.4 cm mass-like anterior pelvic fat cephalad to the urinary bladder. Mild lumbar dextroscoliosis apex L2 with multilevel spondylitic change. Bilateral hip DJD. IMPRESSION: 1. 8.4 cm mass-like inflammatory/edematous changes in the anterior pelvic fat cephalad to the urinary bladder, possibly contusion if history of regional trauma; neoplasm eg liposarcoma would be a less common etiology. 2. Colonic diverticulosis. 3. Small hiatal hernia. 4.  Aortic Atherosclerosis (ICD10-I70.0). Electronically Signed   By: Lucrezia Europe M.D.   On: 11/23/2022 10:20   CT ANGIO HEAD NECK W WO CM W PERF (CODE STROKE)  Addendum Date: 11/23/2022   ADDENDUM REPORT: 11/23/2022 03:55 ADDENDUM: These results were called by telephone at the time of interpretation on 11/23/2022 at 3:54 am  to provider Marry Guan , who verbally acknowledged these results. Electronically Signed   By: Ulyses Jarred M.D.   On: 11/23/2022 03:55   Result Date: 11/23/2022 CLINICAL DATA:  Syncope EXAM: CT ANGIOGRAPHY HEAD AND NECK TECHNIQUE: Multidetector CT imaging of the head and neck was performed using the standard protocol during bolus administration of intravenous contrast. Multiplanar CT image reconstructions and MIPs were obtained to evaluate the vascular anatomy. Carotid stenosis measurements (when applicable) are obtained utilizing NASCET criteria, using the distal internal carotid diameter as the denominator. RADIATION DOSE REDUCTION: This exam was performed according to the departmental dose-optimization program which includes automated exposure control, adjustment of the mA and/or kV according to patient size and/or use of iterative reconstruction technique. 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 No acute fracture. OTHER NECK: Normal pharynx, larynx and major salivary glands. No cervical lymphadenopathy. Unremarkable thyroid gland. UPPER CHEST: No pneumothorax or pleural effusion. No nodules or masses. AORTIC ARCH: There is no calcific atherosclerosis of the aortic arch. There is no aneurysm, dissection or hemodynamically significant stenosis of the visualized portion of the aorta. Conventional 3 vessel aortic branching pattern. The visualized proximal subclavian arteries are widely patent. RIGHT CAROTID SYSTEM: Normal without aneurysm, dissection or stenosis. LEFT CAROTID SYSTEM: Normal without aneurysm, dissection or stenosis. VERTEBRAL ARTERIES: Left dominant configuration. Both origins are clearly patent. There is enhancement of the right V2 segment which becomes occluded distally and remains occluded to the vertebrobasilar junction. CTA HEAD FINDINGS POSTERIOR  CIRCULATION: --Vertebral arteries: Right V4 segment is occluded.  Left is normal. --Inferior cerebellar arteries: Normal. --Basilar artery: Normal. --Superior cerebellar arteries: Normal. --Posterior cerebral arteries (PCA): Normal. ANTERIOR CIRCULATION: --Intracranial internal carotid arteries: Normal. --Anterior cerebral arteries (ACA): Normal. Both A1 segments are present. Patent anterior communicating artery (a-comm). --Middle cerebral arteries (MCA): Normal. VENOUS SINUSES: As permitted by contrast timing, patent. ANATOMIC VARIANTS: None Review of the MIP images confirms the above findings. IMPRESSION: 1. 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 2. No other intracranial arterial occlusion or high-grade stenosis. 3. 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 Electronically Signed: By: KUlyses JarredM.D. On: 11/23/2022 03:31   DG Chest Port 1 View  Result Date: 11/23/2022 CLINICAL DATA:  Weakness, syncope EXAM: PORTABLE CHEST 1 VIEW COMPARISON:  05/15/2022 FINDINGS: Mild patchy/interstitial opacities, right upper lobe predominant. No pleural effusion or  pneumothorax. The heart is normal in size. IMPRESSION: Mild patchy/interstitial opacities, right upper lobe predominant, suspicious for mild pneumonia. Electronically Signed   By: Julian Hy M.D.   On: 11/23/2022 03:23   CT HEAD CODE STROKE WO CONTRAST  Result Date: 11/23/2022 CLINICAL DATA:  Code stroke. EXAM: CT HEAD WITHOUT CONTRAST TECHNIQUE: Contiguous axial images were obtained from the base of the skull through the vertex without intravenous contrast. RADIATION DOSE REDUCTION: This exam was performed according to the departmental dose-optimization program which includes automated exposure control, adjustment of the mA and/or kV according to patient size and/or use of iterative reconstruction technique. COMPARISON:  None Available. FINDINGS: Brain: There is no  mass, hemorrhage or extra-axial collection. The size and configuration of the ventricles and extra-axial CSF spaces are normal. 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 Vascular: No abnormal hyperdensity of the major intracranial arteries or dural venous sinuses. No intracranial atherosclerosis. Skull: The visualized skull base, calvarium and extracranial soft tissues are normal. Sinuses/Orbits: No fluid levels or advanced mucosal thickening of the visualized paranasal sinuses. No mastoid or middle ear effusion. The orbits are normal. ASPECTS [facility] Stroke Program Early CT Score) - Ganglionic level infarction (caudate, lentiform nuclei, internal capsule, insula, M1-M3 cortex): 7 - Supraganglionic infarction (M4-M6 cortex): 3 Total score (0-10 with 10 being normal): 10 IMPRESSION: 1. No acute intracranial abnormality. 2. 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 3. ASPECTS is 10. These results were called by telephone at the time of interpretation on 11/23/2022 at 2:04 am to provider JADE SUNG , who verbally acknowledged these results. Electronically Signed   By: Ulyses Jarred M.D.   On: 11/23/2022 02:05    Assessment:   # Nausea Vomiting - scant "dark" emesis - suspect 2/2 acute stroke and pna  # Acute CVA - neurology consulted  # Recent CVA with left sided deficits- September  # anemia - hgb 12 in September, now 10; was 9.9 on 11/17 so it is unchanged over the last month  # mass noted near urinary bladder on ct - surgery consulted  # Aspiration pna  # aki on ckd  # diverticulosis  Plan:  Protonix 40 mg iv bid; should go home on daily ppi indefinitely if remaining on anti-plt/anti coag therapy Low suspicion for GIB.  Normal BUN Cr ratio; VSS; Hgb was 9.9 on 11/17 Given acute stroke, aspiration pna, pt would not be good sedation candidate and would be high risk for endoscopy. Husband is in agreeance and would prefer no endoscopy at this time  If pt should become  hemodynamically unstable with signs of GIB would recommend CTA with possible embolization by IR/Vascular Surgery  Monitor H&H.  Transfusion and resuscitation as per primary team Avoid frequent lab draws to prevent lab induced anemia Supportive care and antiemetics as per primary team Maintain two sites IV access Avoid nsaids Monitor for GIB.  DO NOT PERFORM STOOL OCCULT TESTING Stool occult blood test has no role in evaluation of anemia and is a test for colon cancer screening and is inappropriate to be used for evaluation of anemia/gastrointestinal bleeding, as a negative or positive test would not change evaluation.  Diet as per speech therapy  No plan/indication for endoscopic evaluation at this time. Additionally it would be high risk in setting of acute stroke and aspiration pna which likely explain her sx.   Potentially could have esophagitis/gastritis from repeated dry heaving/retching while on plavix which would explain scant "dark" emesis- would recommend anti-emetics, ppi,  and conservative management in this scenario  Based on current situation, GI will follow peripherally. Call back if needed  I personally performed the service.  Management of other medical comorbidities as per primary team  Thank you for allowing [place] to participate in this patient's care. Please don't hesitate to call if any questions or concerns arise.   Annamaria Helling, DO [facility]  Portions of the record may have been created with voice recognition software. Occasional wrong-word or 'sound-a-like' substitutions may have occurred due to the inherent limitations of voice recognition software.  Read the chart carefully and recognize, using context, where substitutions may have occurred.

## 2022-11-23 NOTE — Consult Note (Signed)
Date of Consultation:  11/23/2022  Requesting Physician: Ivor Costa, MD  Reason for Consultation: Possible abdominal mass  History of Present Illness: [name] is a 81 y.o. 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  The patient's husband reports that she was going to the bathroom had a syncopal episode.  Her husband was able to assist her down to the floor so she never had a head injury.  Upon waking up, she felt weaker on her left side.  Came in also with headaches nausea and vomiting x 1.  The patient's husband reports that over the last 6 weeks she has been having issues with some abdominal discomfort and she is also had issues with nausea.  She does have a history of a hiatal hernia and takes pantoprazole for this.  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  Neurology was consulted and she currently is on a heparin drip in addition to her usual Plavix.  She also had a chest x-ray which shows a right upper lobe pneumonia.  Because of the abdominal pain also a CT scan of the abdomen pelvis was obtained which shows no bowel distention but does show an area in the lower abdominal mesentery with inflammatory or edematous changes measuring about 8.4 cm in size.  Past Medical History: Past Medical History:  Diagnosis Date   Anemia    Arthritis    Asthma    uses inhaler just prior to surgery to avoid attack   Back pain    from previous injury   Complication of anesthesia    has woken  up during 2 different surgery   Depression    no current issue/treatment; situation   Gallstones    GERD (gastroesophageal reflux disease)    Hiatal hernia    patient does NOT have nerve/muscle disease   History of kidney stones    HLD (hyperlipidemia)    HTN (hypertension)    Hypothyroidism    Kidney stones    Knee pain     Non-diabetic pancreatic hormone dysfunction years   pt. 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   Pancreatitis    Pneumonia    Seizures (HCC)    caused by dye injected during a procedure   Shortness of breath    with exertion   Sinus problem    frequent infections/congestion   Stroke ([place]) 2021   reports having CVA in 2021 and having mini strokes before that   Thyroid disease      Past Surgical History: Past Surgical History:  Procedure Laterality Date   ABDOMINAL HYSTERECTOMY     APPENDECTOMY     CARPAL TUNNEL RELEASE  10+ years ago   bilateral   EYE SURGERY  3 yrs ago   bilateral cataracts   FOOT OSTEOTOMY  6 weeks ago   Left foot: great, 2nd & 3rd   FOOT OSTEOTOMY  5 years ago   Right great toe   HAND SURGERY Bilateral 2011-most recent   multiple hand surgeries, 2 on left, 3 on right   KNEE ARTHROPLASTY Right 04/28/2022   Procedure: COMPUTER ASSISTED TOTAL KNEE ARTHROPLASTY;  Surgeon: Dereck Leep, MD;  Location: [facility];  Service: Orthopedics;  Laterality: Right;   LOOP RECORDER INSERTION N/A 05/16/2020   Procedure: LOOP RECORDER INSERTION;  Surgeon: Isaias Cowman, MD;  Location: [place] [facility];  Service: Cardiovascular;  Laterality: N/A;   NASAL SINUS SURGERY  most recent 7-8 yrs ago   7 sinus surgeries    TRIGGER FINGER RELEASE  11/19/2011   Procedure: RELEASE TRIGGER FINGER/A-1 PULLEY;  Surgeon: Wynonia Sours, MD;  Location: [place];  Service: Orthopedics;  Laterality: Right;  release a-1 pulley right index finger and cyst removal   WRIST GANGLION EXCISION  1980's   right    Home Medications: Prior to Admission medications   Medication Sig Start Date End Date Taking? Authorizing Provider  albuterol (PROVENTIL HFA;VENTOLIN HFA) 108 (90 Base) MCG/ACT inhaler Inhale 2 puffs into the lungs every 4 (four) hours as needed for wheezing or shortness of breath.     [provider]  amLODipine (NORVASC) 10 MG tablet TAKE 1 TABLET  BY MOUTH EVERY DAY 09/29/22   Gertie Gowda, DO  aspirin EC 81 MG tablet Take 1 tablet (81 mg total) by mouth daily. Swallow whole. 09/05/22   Love, Ivan Anchors, PA-C  atorvastatin (LIPITOR) 40 MG tablet TAKE 1 TABLET BY MOUTH EVERY DAY 09/29/22   Durel Salts C, DO  clopidogrel (PLAVIX) 75 MG tablet Take 1 tablet (75 mg total) by mouth daily. 09/05/22   Love, Ivan Anchors, PA-C  diclofenac Sodium (VOLTAREN) 1 % GEL Apply 2 g topically 3 (three) times daily. 09/05/22   Love, Ivan Anchors, PA-C  DULoxetine (CYMBALTA) 30 MG capsule Take 1 capsule (30 mg total) by mouth daily. 09/05/22   Love, Ivan Anchors, PA-C  folic acid (FOLVITE) 921 MCG tablet Take 1 tablet (800 mcg total) by mouth daily. 09/05/22   Love, Ivan Anchors, PA-C  gabapentin (NEURONTIN) 300 MG capsule Take 1 capsule (300 mg total) by mouth 3 (three) times daily. 09/05/22   Love, Ivan Anchors, PA-C  levothyroxine (SYNTHROID) 50 MCG tablet Take 1 tablet (50 mcg total) by mouth daily before breakfast. 09/05/22   Love, Ivan Anchors, PA-C  losartan (COZAAR) 50 MG tablet TAKE 1 TABLET BY MOUTH EVERY DAY 11/04/22   Durel Salts C, DO  montelukast (SINGULAIR) 10 MG tablet Take 1 tablet (10 mg total) by mouth daily. 09/05/22   Love, Ivan Anchors, PA-C  Multiple Vitamins-Minerals (MULTIVITAMIN GUMMIES ADULT) CHEW Chew 2 capsules by mouth daily.    [provider]  pantoprazole (PROTONIX) 40 MG tablet TAKE 1 TABLET (40 MG TOTAL) BY MOUTH SEE ADMIN INSTRUCTIONS. 09/29/22 11/20/26  Durel Salts C, DO  potassium chloride (KLOR-CON M10) 10 MEQ tablet TAKE 2 TABLETS BY MOUTH 2 TIMES DAILY. 09/29/22   Gertie Gowda, DO  predniSONE (DELTASONE) 10 MG tablet Take 1 tablet (10 mg total) by mouth daily. 09/05/22   Love, Ivan Anchors, PA-C  propranolol ER (INDERAL LA) 60 MG 24 hr capsule Take 1 capsule (60 mg total) by mouth daily. 09/05/22   Bary Leriche, PA-C    Allergies: Allergies  Allergen Reactions   Cinobac [Cinoxacin] Anaphylaxis    "My throat swelled and I stopped  breathing."   Latex Dermatitis    IgE = 14 (WNL) on 04/17/2022 Pt is highly reactive to any latex products. Blisters skin. "Takes my skin off"   Other Shortness Of Breath    Muscle relaxers? Pt thinks allergic to muscle relaxers, reports stops breathing but not sure what medicine and if for sure muscle relaxer   Petrolatum Distillates [Petroleum Distillate] Shortness Of Breath    Passes out    Sulfa Antibiotics Itching   Nylon Itching  Social History:  reports that she has never smoked. She has never used smokeless tobacco. She reports that she does not currently use alcohol. She reports that she does not use drugs.   Family History: Family History  Problem Relation Age of Onset   Anesthesia problems Father        "bad lungs" couldn't wake him up   Heart disease Father    Heart attack Father 72   Breast cancer Maternal Grandmother    Breast cancer Maternal Aunt        x 2   Breast cancer Cousin    Pancreatic cancer Cousin    Heart attack Paternal Uncle    Stroke Paternal Grandfather     Review of Systems: Review of Systems  Unable to perform ROS: Medical condition    Physical Exam BP 137/69   Pulse 95   Temp (!) 97.5 F (36.4 C) (Oral)   Resp (!) 22   Ht '4\' 11"'$  (1.499 m)   Wt 48.4 kg   SpO2 97%   BMI 21.55 kg/m  CONSTITUTIONAL: No acute distress, currently resting comfortably HEENT:  Normocephalic, atraumatic, extraocular motion intact. NECK: Trachea is midline, and there is no jugular venous distension. RESPIRATORY:  Normal respiratory effort without pathologic use of accessory muscles. CARDIOVASCULAR: Regular rhythm and rate. GI: The abdomen is soft, nondistended, with some discomfort to palpation just inferior to the umbilicus corresponding to the area of this mesenteric inflammation or edema.  No diffuse peritonitis.   MUSCULOSKELETAL: No peripheral edema. SKIN: Skin turgor is normal. There are no pathologic skin lesions.  NEUROLOGIC: Unable to  assess. PSYCH: Unable to assess.  Laboratory Analysis: Results for orders placed or performed during the hospital encounter of 11/23/22 (from the past 24 hour(s))  CBG monitoring, ED     Status: Abnormal   Collection Time: 11/23/22  1:50 AM  Result Value Ref Range   Glucose-Capillary 139 (H) 70 - 99 mg/dL  Brain natriuretic peptide     Status: None   Collection Time: 11/23/22  2:02 AM  Result Value Ref Range   B Natriuretic Peptide 87.7 0.0 - 100.0 pg/mL  Ethanol     Status: None   Collection Time: 11/23/22  2:04 AM  Result Value Ref Range   Alcohol, Ethyl (B) <10 <10 mg/dL  Protime-INR     Status: None   Collection Time: 11/23/22  2:04 AM  Result Value Ref Range   Prothrombin Time 13.0 11.4 - 15.2 seconds   INR 1.0 0.8 - 1.2  APTT     Status: None   Collection Time: 11/23/22  2:04 AM  Result Value Ref Range   aPTT 26 24 - 36 seconds  CBC     Status: Abnormal   Collection Time: 11/23/22  2:04 AM  Result Value Ref Range   WBC 11.6 (H) 4.0 - 10.5 K/uL   RBC 3.66 (L) 3.87 - 5.11 MIL/uL   Hemoglobin 10.5 (L) 12.0 - 15.0 g/dL   HCT 34.0 (L) 36.0 - 46.0 %   MCV 92.9 80.0 - 100.0 fL   MCH 28.7 26.0 - 34.0 pg   MCHC 30.9 30.0 - 36.0 g/dL   RDW 14.7 11.5 - 15.5 %   Platelets 546 (H) 150 - 400 K/uL   nRBC 0.0 0.0 - 0.2 %  Differential     Status: Abnormal   Collection Time: 11/23/22  2:04 AM  Result Value Ref Range   Neutrophils Relative % 80 %   Neutro Abs  9.1 (H) 1.7 - 7.7 K/uL   Lymphocytes Relative 11 %   Lymphs Abs 1.3 0.7 - 4.0 K/uL   Monocytes Relative 8 %   Monocytes Absolute 0.9 0.1 - 1.0 K/uL   Eosinophils Relative 0 %   Eosinophils Absolute 0.0 0.0 - 0.5 K/uL   Basophils Relative 0 %   Basophils Absolute 0.1 0.0 - 0.1 K/uL   Immature Granulocytes 1 %   Abs Immature Granulocytes 0.15 (H) 0.00 - 0.07 K/uL  Comprehensive metabolic panel     Status: Abnormal   Collection Time: 11/23/22  2:04 AM  Result Value Ref Range   Sodium 142 135 - 145 mmol/L   Potassium  3.7 3.5 - 5.1 mmol/L   Chloride 109 98 - 111 mmol/L   CO2 21 (L) 22 - 32 mmol/L   Glucose, Bld 179 (H) 70 - 99 mg/dL   BUN 22 8 - 23 mg/dL   Creatinine, Ser 1.79 (H) 0.44 - 1.00 mg/dL   Calcium 9.6 8.9 - 10.3 mg/dL   Total Protein 6.9 6.5 - 8.1 g/dL   Albumin 3.8 3.5 - 5.0 g/dL   AST 34 15 - 41 U/L   ALT 24 0 - 44 U/L   Alkaline Phosphatase 64 38 - 126 U/L   Total Bilirubin 0.9 0.3 - 1.2 mg/dL   GFR, Estimated 28 (L) >60 mL/min   Anion gap 12 5 - 15  Urine Drug Screen, Qualitative     Status: None   Collection Time: 11/23/22  2:04 AM  Result Value Ref Range   Tricyclic, Ur Screen NONE DETECTED NONE DETECTED   Amphetamines, Ur Screen NONE DETECTED NONE DETECTED   MDMA (Ecstasy)Ur Screen NONE DETECTED NONE DETECTED   Cocaine Metabolite,Ur [place] NONE DETECTED NONE DETECTED   Opiate, Ur Screen NONE DETECTED NONE DETECTED   Phencyclidine (PCP) Ur S NONE DETECTED NONE DETECTED   Cannabinoid 50 Ng, Ur [place] NONE DETECTED NONE DETECTED   Barbiturates, Ur Screen NONE DETECTED NONE DETECTED   Benzodiazepine, Ur Scrn NONE DETECTED NONE DETECTED   Methadone Scn, Ur NONE DETECTED NONE DETECTED  Urinalysis, Routine w reflex microscopic     Status: Abnormal   Collection Time: 11/23/22  2:04 AM  Result Value Ref Range   Color, Urine STRAW (A) YELLOW   APPearance CLEAR (A) CLEAR   Specific Gravity, Urine 1.026 1.005 - 1.030   pH 6.0 5.0 - 8.0   Glucose, UA 50 (A) NEGATIVE mg/dL   Hgb urine dipstick MODERATE (A) NEGATIVE   Bilirubin Urine NEGATIVE NEGATIVE   Ketones, ur NEGATIVE NEGATIVE mg/dL   Protein, ur NEGATIVE NEGATIVE mg/dL   Nitrite NEGATIVE NEGATIVE   Leukocytes,Ua NEGATIVE NEGATIVE   RBC / HPF 0-5 0 - 5 RBC/hpf   WBC, UA 0-5 0 - 5 WBC/hpf   Bacteria, UA RARE (A) NONE SEEN   Squamous Epithelial / LPF 0-5 0 - 5  Troponin I (High Sensitivity)     Status: None   Collection Time: 11/23/22  2:04 AM  Result Value Ref Range   Troponin I (High Sensitivity) 7 <18 ng/L  Troponin I (High  Sensitivity)     Status: None   Collection Time: 11/23/22  4:12 AM  Result Value Ref Range   Troponin I (High Sensitivity) 10 <18 ng/L  Culture, blood (routine x 2)     Status: None (Preliminary result)   Collection Time: 11/23/22  4:12 AM   Specimen: BLOOD RIGHT ARM  Result Value Ref Range   Specimen  Description BLOOD RIGHT ARM    Special Requests      BOTTLES DRAWN AEROBIC AND ANAEROBIC Blood Culture adequate volume   Culture      NO GROWTH < 12 HOURS Performed at [facility], [address] [place], [place] [address]    Report Status PENDING   Culture, blood (routine x 2)     Status: None (Preliminary result)   Collection Time: 11/23/22  4:12 AM   Specimen: BLOOD RIGHT ARM  Result Value Ref Range   Specimen Description BLOOD RIGHT ARM    Special Requests      BOTTLES DRAWN AEROBIC AND ANAEROBIC Blood Culture adequate volume   Culture      NO GROWTH < 12 HOURS Performed at [facility], [address], [place],  [address]    Report Status PENDING   Lactic acid, plasma     Status: None   Collection Time: 11/23/22  4:12 AM  Result Value Ref Range   Lactic Acid, Venous 1.9 0.5 - 1.9 mmol/L  Procalcitonin - Baseline     Status: None   Collection Time: 11/23/22  4:12 AM  Result Value Ref Range   Procalcitonin <0.10 ng/mL  Lipase, blood     Status: None   Collection Time: 11/23/22  4:12 AM  Result Value Ref Range   Lipase 32 11 - 51 U/L    Imaging: CT ABDOMEN PELVIS WO CONTRAST  Result Date: 11/23/2022 CLINICAL DATA:  Abdominal pain, acute, nonlocalized EXAM: CT ABDOMEN AND PELVIS WITHOUT CONTRAST TECHNIQUE: Multidetector CT imaging of the abdomen and pelvis was performed following the standard protocol without IV contrast. RADIATION DOSE REDUCTION: This exam was performed according to the departmental dose-optimization program which includes automated exposure control, adjustment of the mA and/or kV according to patient size and/or use of  iterative reconstruction technique. COMPARISON:  07/30/2004 by report only FINDINGS: Lower chest: No pleural or pericardial effusion. Small hiatal hernia. Visualized lung bases clear. Hepatobiliary: Hyperdense gallbladder contents probably vicarious excretion of previously administered contrast material. No focal liver lesion or biliary ductal dilatation. Pancreas: Unremarkable. No pancreatic ductal dilatation or surrounding inflammatory changes. Spleen: Normal in size without focal abnormality. Adrenals/Urinary Tract: No adrenal mass. Mildly striated appearance of kidneys suggesting renal dysfunction. No hydronephrosis. Residual contrast material in the distended urinary bladder. Stomach/Bowel: Small hiatal hernia involving gastric fundus. Small bowel decompressed. Appendix not identified. The colon is incompletely distended by gas and fecal material with scattered diverticula; no adjacent inflammatory/edematous change. Vascular/Lymphatic: Scattered aortoiliac calcified atheromatous plaque without aneurysm. No abdominal or pelvic adenopathy. Reproductive: Post hysterectomy. 4.8 cm simple appearing left lower quadrant cyst abutting distal sigmoid colon, described on previous report of 07/30/2004 measured 4 cm, presumably benign adnexal or duplication cyst given significant stability over time. Other: No ascites.  No free air. Musculoskeletal: Poorly marginated streaky inflammatory/edematous changes in 8.4 cm mass-like anterior pelvic fat cephalad to the urinary bladder. Mild lumbar dextroscoliosis apex L2 with multilevel spondylitic change. Bilateral hip DJD. IMPRESSION: 1. 8.4 cm mass-like inflammatory/edematous changes in the anterior pelvic fat cephalad to the urinary bladder, possibly contusion if history of regional trauma; neoplasm eg liposarcoma would be a less common etiology. 2. Colonic diverticulosis. 3. Small hiatal hernia. 4.  Aortic Atherosclerosis (ICD10-I70.0). Electronically Signed   By: Lucrezia Europe  M.D.   On: 11/23/2022 10:20   CT ANGIO HEAD NECK W WO CM W PERF (CODE STROKE)  Addendum Date: 11/23/2022   ADDENDUM REPORT: 11/23/2022 03:55 ADDENDUM: These results were  called by telephone at the time of interpretation on 11/23/2022 at 3:54 am to provider Marry Guan , who verbally acknowledged these results. Electronically Signed   By: Ulyses Jarred M.D.   On: 11/23/2022 03:55   Result Date: 11/23/2022 CLINICAL DATA:  Syncope EXAM: CT ANGIOGRAPHY HEAD AND NECK TECHNIQUE: Multidetector CT imaging of the head and neck was performed using the standard protocol during bolus administration of intravenous contrast. Multiplanar CT image reconstructions and MIPs were obtained to evaluate the vascular anatomy. Carotid stenosis measurements (when applicable) are obtained utilizing NASCET criteria, using the distal internal carotid diameter as the denominator. RADIATION DOSE REDUCTION: This exam was performed according to the departmental dose-optimization program which includes automated exposure control, adjustment of the mA and/or kV according to patient size and/or use of iterative reconstruction technique. 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 No acute fracture. OTHER NECK: Normal pharynx, larynx and major salivary glands. No cervical lymphadenopathy. Unremarkable thyroid gland. UPPER CHEST: No pneumothorax or pleural effusion. No nodules or masses. AORTIC ARCH: There is no calcific atherosclerosis of the aortic arch. There is no aneurysm, dissection or hemodynamically significant stenosis of the visualized portion of the aorta. Conventional 3 vessel aortic branching pattern. The visualized proximal subclavian arteries are widely patent. RIGHT CAROTID SYSTEM: Normal without aneurysm, dissection or stenosis. LEFT CAROTID SYSTEM: Normal without aneurysm, dissection or  stenosis. VERTEBRAL ARTERIES: Left dominant configuration. Both origins are clearly patent. There is enhancement of the right V2 segment which becomes occluded distally and remains occluded to the vertebrobasilar junction. CTA HEAD FINDINGS POSTERIOR CIRCULATION: --Vertebral arteries: Right V4 segment is occluded.  Left is normal. --Inferior cerebellar arteries: Normal. --Basilar artery: Normal. --Superior cerebellar arteries: Normal. --Posterior cerebral arteries (PCA): Normal. ANTERIOR CIRCULATION: --Intracranial internal carotid arteries: Normal. --Anterior cerebral arteries (ACA): Normal. Both A1 segments are present. Patent anterior communicating artery (a-comm). --Middle cerebral arteries (MCA): Normal. VENOUS SINUSES: As permitted by contrast timing, patent. ANATOMIC VARIANTS: None Review of the MIP images confirms the above findings. IMPRESSION: 1. 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 2. No other intracranial arterial occlusion or high-grade stenosis. 3. 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 Electronically Signed: By: KUlyses JarredM.D. On: 11/23/2022 03:31   DG Chest Port 1 View  Result Date: 11/23/2022 CLINICAL DATA:  Weakness, syncope EXAM: PORTABLE CHEST 1 VIEW COMPARISON:  05/15/2022 FINDINGS: Mild patchy/interstitial opacities, right upper lobe predominant. No pleural effusion or pneumothorax. The heart is normal in size. IMPRESSION: Mild patchy/interstitial opacities, right upper lobe predominant, suspicious for mild pneumonia. Electronically Signed   By: SJulian HyM.D.   On: 11/23/2022 03:23   CT HEAD CODE STROKE WO CONTRAST  Result Date: 11/23/2022 CLINICAL DATA:  Code stroke. EXAM: CT HEAD WITHOUT CONTRAST TECHNIQUE: Contiguous axial images were obtained from the base of the skull through the vertex without intravenous contrast. RADIATION DOSE REDUCTION: This exam was performed according to  the departmental dose-optimization program which includes automated exposure control, adjustment of the mA and/or kV according to patient size and/or use of iterative reconstruction technique. COMPARISON:  None Available. FINDINGS: Brain: There is no mass, hemorrhage or extra-axial collection. The size and configuration of the ventricles and extra-axial CSF spaces are normal. 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 Vascular: No abnormal hyperdensity of the major intracranial arteries or dural venous sinuses. No intracranial  atherosclerosis. Skull: The visualized skull base, calvarium and extracranial soft tissues are normal. Sinuses/Orbits: No fluid levels or advanced mucosal thickening of the visualized paranasal sinuses. No mastoid or middle ear effusion. The orbits are normal. ASPECTS [facility] Stroke Program Early CT Score) - Ganglionic level infarction (caudate, lentiform nuclei, internal capsule, insula, M1-M3 cortex): 7 - Supraganglionic infarction (M4-M6 cortex): 3 Total score (0-10 with 10 being normal): 10 IMPRESSION: 1. No acute intracranial abnormality. 2. 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 3. ASPECTS is 10. These results were called by telephone at the time of interpretation on 11/23/2022 at 2:04 am to provider JADE SUNG , who verbally acknowledged these results. Electronically Signed   By: Ulyses Jarred M.D.   On: 11/23/2022 02:05    Assessment and Plan: This is a 81 y.o. female with a right vertebral artery occlusion and abdominal pain showing a potential mesenteric mass versus inflammatory changes.  - Discussed with the patient's husband the findings on the CT scan.  Upon personal review, this area in the mesentery of the lower abdomen appears less organized and I think may be more related to a contusion rather than a true mass.  This lacks enough organization to be a mass.  The patient's husband tells me that sometimes he has to grab her by her arms over her upper  abdomen depending on how she is moving or is may be losing balance.  Potentially this could have been as a result of such an event particularly given that she is on Plavix.  CT scan does not show any issues with the small bowel or colon and there is no small bowel distention or air-fluid levels to suggest obstruction as a cause of the pain or her nausea. - For now, no acute surgical needs.  I would recommend a follow-up CT scan with IV contrast if possible in a few weeks to reevaluate this area.  If is still persistent, may be helpful to have an MRI of the pelvis to further evaluate this.  I spent 45 minutes dedicated to the care of this patient on the date of this encounter to include pre-visit review of records, face-to-face time with the patient discussing diagnosis and management, and any post-visit coordination of care.   Melvyn Neth, MD [facility] Pg:  [phone]

## 2022-11-23 NOTE — ED Notes (Signed)
Husband advised pt went to sleep around 2pm and has been asleep since then. Pt got up around 0100 this morning and went to the bathroom. Pt had a witnessed syncope episode by husband. Pt was assisted to the ground. Denies fall or trauma to the head. Husband then put her back in bed and called EMS.   Pt advised she has been sick with abdominal discomfort and diarrhea.

## 2022-11-23 NOTE — ED Notes (Addendum)
Pt had period of unconsciousness. And seizure like activity. MD called to bedside.

## 2022-11-23 NOTE — Progress Notes (Addendum)
[place] for heparin infusion Indication: stroke/CVA  Allergies  Allergen Reactions   Cinobac [Cinoxacin] Anaphylaxis    "My throat swelled and I stopped breathing."   Latex Dermatitis    IgE = 14 (WNL) on 04/17/2022 Pt is highly reactive to any latex products. Blisters skin. "Takes my skin off"   Other Shortness Of Breath    Muscle relaxers? Pt thinks allergic to muscle relaxers, reports stops breathing but not sure what medicine and if for sure muscle relaxer   Petrolatum Distillates [Petroleum Distillate] Shortness Of Breath    Passes out    Sulfa Antibiotics Itching   Nylon Itching    Patient Measurements: Height: '4\' 11"'$  (149.9 cm) Weight: 48.4 kg (106 lb 11.2 oz) IBW/kg (Calculated) : 43.2 Heparin Dosing Weight: 48.4 kg  Vital Signs: Temp: 98 F (36.7 C) (12/17 0207) Temp Source: Oral (12/17 0207) BP: 134/83 (12/17 0330) Pulse Rate: 99 (12/17 0330)  Labs: Recent Labs    11/23/22 0204  HGB 10.5*  HCT 34.0*  PLT 546*  APTT 26  LABPROT 13.0  INR 1.0  CREATININE 1.79*  TROPONINIHS 7    Estimated Creatinine Clearance: 17.1 mL/min (A) (by C-G formula based on SCr of 1.79 mg/dL (H)).   Medical History: Past Medical History:  Diagnosis Date   Anemia    Arthritis    Asthma    uses inhaler just prior to surgery to avoid attack   Back pain    from previous injury   Complication of anesthesia    has woken  up during 2 different surgery   Depression    no current issue/treatment; situation   Gallstones    GERD (gastroesophageal reflux disease)    Hiatal hernia    patient does NOT have nerve/muscle disease   History of kidney stones    HLD (hyperlipidemia)    HTN (hypertension)    Hypothyroidism    Kidney stones    Knee pain    Non-diabetic pancreatic hormone dysfunction years   pt. 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   Pancreatitis    Pneumonia    Seizures (HCC)    caused by dye injected during a  procedure   Shortness of breath    with exertion   Sinus problem    frequent infections/congestion   Stroke ([place]) 2021   reports having CVA in 2021 and having mini strokes before that   Thyroid disease     Assessment: Pt is a 81 yo female presenting to ED due to syncope & left side weakness after recent stroke 3 months ago.  Goal of Therapy:  Heparin level 0.3-0.5 units/ml Monitor platelets by anticoagulation protocol: Yes   Plan:  No boluses Start heparin infusion at 700 units/hr Will check HL in 8 hr after start of infusion CBC daily while on heparin  Renda Rolls, PharmD, [facility] 11/23/2022 4:10 AM

## 2022-11-23 NOTE — Plan of Care (Signed)
MRI personally reviewed, agree with radiology 1. 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 2. 9 mm right occipital pole infarct. 3. Punctate white matter infarct in the left occipital lobe. 4. Additional punctate cortical infarct in the medial right occipital lobe more superiorly. 5. Expected evolution of previous right posterior frontal lobe white matter infarct. 6. Stable atrophy and white matter disease likely reflects the sequela of chronic microvascular ischemia.  MRI C-spine personally reviewed, agree with radiology:   1. Multilevel spondylosis of the cervical spine as described. 2. 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 3. 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 4. No other significant cord signal abnormality to suggest ischemic changes to the cord. 5. Severe left and moderate right foraminal stenosis at C3-4 and C4-5. 6. Moderate foraminal stenosis bilaterally at C5-6 and C6-7 is worse on the right. 7. Mild bilateral foraminal narrowing at C7-T1. 8. Prominent soft tissue pannus at C1-2 effaces the ventral CSF. 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  Repeat transthoracic echo, pending A1c and lipid panel pending Continue telemetry Continue to hold off on heparin due to risk of hemorrhagic transformation of acute strokes Resume aspirin and Plavix when cleared from  [facility] perspective Neurology will follow along tomorrow

## 2022-11-23 NOTE — Progress Notes (Addendum)
CODE STROKE [place] @ [phone] Paged [facility] @ [phone] Dr. Helane Rima on screen @ [phone] LKW Unknown MRS 0

## 2022-11-23 NOTE — ED Provider Notes (Signed)
[facility] Provider Note    Event Date/Time   First MD Initiated Contact with Patient 11/23/22 0141     (approximate)   History   Weakness   HPI  Level V caveat: Limited by Jacqualin Combes [name] is a 81 y.o. female brought to the ED via EMS from home with a chief complaint of weakness.  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  States she was still awake, went to use the restroom and had a syncopal episode approximately 15 to 20 minutes prior to arrival.  She was assisted to the ground by her husband so she did not fall or strike the floor.  Upon awakening patient feels weaker on her left side.  Endorses headache, nausea and vomiting x 1.     Past Medical History   Past Medical History:  Diagnosis Date   Anemia    Arthritis    Asthma    uses inhaler just prior to surgery to avoid attack   Back pain    from previous injury   Complication of anesthesia    has woken  up during 2 different surgery   Depression    no current issue/treatment; situation   Gallstones    GERD (gastroesophageal reflux disease)    Hiatal hernia    patient does NOT have nerve/muscle disease   History of kidney stones    HLD (hyperlipidemia)    HTN (hypertension)    Hypothyroidism    Kidney stones    Knee pain    Non-diabetic pancreatic hormone dysfunction years   pt. 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   GERD (gastroesophageal reflux disease) 08/21/2022   Agitation 08/20/2022   Acute left-sided weakness 08/20/2022   Expressive aphasia    Stroke ([place]) 08/19/2022   Leukocytosis 08/19/2022   History of urticaria 04/28/2022   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   Primary osteoarthritis of left knee 02/24/2022   Primary osteoarthritis of right knee 02/24/2022   Lumbar spondylolysis 04/12/2020   History of CVA (cerebrovascular accident) 03/26/2020   Low back pain radiating to right lower extremity 03/21/2020   B12 deficiency 03/06/2020   Positive anti-CCP test 12/21/2019   Arthralgia 12/13/2019   Dermatitis 12/13/2019   Rheumatoid factor positive 12/13/2019   Essential hypertension 12/11/2018   Palpitations 12/11/2018   Acquired hypothyroidism 11/10/2018   Arthritis of knee 09/17/2016   Anxiety 11/22/2014   Asthma without status asthmaticus 11/22/2014   Benign neoplasm of colon, unspecified 11/22/2014   Environmental allergies 11/22/2014   Hypertriglyceridemia 11/22/2014   Hypokalemia 11/22/2014   Personal history of disease of skin and subcutaneous tissue 11/22/2014     Past Surgical History   Past Surgical History:  Procedure Laterality Date   ABDOMINAL HYSTERECTOMY     APPENDECTOMY  CARPAL TUNNEL RELEASE  10+ years ago   bilateral   EYE SURGERY  3 yrs ago   bilateral cataracts   FOOT OSTEOTOMY  6 weeks ago   Left foot: great, 2nd & 3rd   FOOT OSTEOTOMY  5 years ago   Right great toe   HAND SURGERY Bilateral 2011-most recent   multiple hand surgeries, 2 on left, 3 on right   KNEE ARTHROPLASTY Right 04/28/2022   Procedure: COMPUTER ASSISTED TOTAL KNEE ARTHROPLASTY;  Surgeon: Dereck Leep, MD;  Location: [facility];  Service: Orthopedics;  Laterality: Right;   LOOP RECORDER INSERTION N/A 05/16/2020   Procedure: LOOP RECORDER INSERTION;  Surgeon: Isaias Cowman, MD;  Location: [facility];  Service: Cardiovascular;  Laterality: N/A;    NASAL SINUS SURGERY  most recent 7-8 yrs ago   7 sinus surgeries    TRIGGER FINGER RELEASE  11/19/2011   Procedure: RELEASE TRIGGER FINGER/A-1 PULLEY;  Surgeon: Wynonia Sours, MD;  Location: [place];  Service: Orthopedics;  Laterality: Right;  release a-1 pulley right index finger and cyst removal   WRIST GANGLION EXCISION  1980's   right     Home Medications   Prior to Admission medications   Medication Sig Start Date End Date Taking? Authorizing Provider  albuterol (PROVENTIL HFA;VENTOLIN HFA) 108 (90 Base) MCG/ACT inhaler Inhale 2 puffs into the lungs every 4 (four) hours as needed for wheezing or shortness of breath.     [provider]  amLODipine (NORVASC) 10 MG tablet TAKE 1 TABLET BY MOUTH EVERY DAY 09/29/22   Gertie Gowda, DO  aspirin EC 81 MG tablet Take 1 tablet (81 mg total) by mouth daily. Swallow whole. 09/05/22   Love, Ivan Anchors, PA-C  atorvastatin (LIPITOR) 40 MG tablet TAKE 1 TABLET BY MOUTH EVERY DAY 09/29/22   Durel Salts C, DO  clopidogrel (PLAVIX) 75 MG tablet Take 1 tablet (75 mg total) by mouth daily. 09/05/22   Love, Ivan Anchors, PA-C  diclofenac Sodium (VOLTAREN) 1 % GEL Apply 2 g topically 3 (three) times daily. 09/05/22   Love, Ivan Anchors, PA-C  DULoxetine (CYMBALTA) 30 MG capsule Take 1 capsule (30 mg total) by mouth daily. 09/05/22   Love, Ivan Anchors, PA-C  folic acid (FOLVITE) 607 MCG tablet Take 1 tablet (800 mcg total) by mouth daily. 09/05/22   Love, Ivan Anchors, PA-C  gabapentin (NEURONTIN) 300 MG capsule Take 1 capsule (300 mg total) by mouth 3 (three) times daily. 09/05/22   Love, Ivan Anchors, PA-C  levothyroxine (SYNTHROID) 50 MCG tablet Take 1 tablet (50 mcg total) by mouth daily before breakfast. 09/05/22   Love, Ivan Anchors, PA-C  losartan (COZAAR) 50 MG tablet TAKE 1 TABLET BY MOUTH EVERY DAY 11/04/22   Durel Salts C, DO  montelukast (SINGULAIR) 10 MG tablet Take 1 tablet (10 mg total) by mouth daily. 09/05/22   Love, Ivan Anchors, PA-C   Multiple Vitamins-Minerals (MULTIVITAMIN GUMMIES ADULT) CHEW Chew 2 capsules by mouth daily.    [provider]  pantoprazole (PROTONIX) 40 MG tablet TAKE 1 TABLET (40 MG TOTAL) BY MOUTH SEE ADMIN INSTRUCTIONS. 09/29/22 11/20/26  Durel Salts C, DO  potassium chloride (KLOR-CON M10) 10 MEQ tablet TAKE 2 TABLETS BY MOUTH 2 TIMES DAILY. 09/29/22   Gertie Gowda, DO  predniSONE (DELTASONE) 10 MG tablet Take 1 tablet (10 mg total) by mouth daily. 09/05/22   Love, Ivan Anchors, PA-C  propranolol ER (INDERAL LA) 60 MG 24 hr capsule  Take 1 capsule (60 mg total) by mouth daily. 09/05/22   Love, Ivan Anchors, PA-C     Allergies  Cinobac [cinoxacin], Latex, Other, Petrolatum distillates [petroleum distillate], Sulfa antibiotics, and Nylon   Family History   Family History  Problem Relation Age of Onset   Anesthesia problems Father        "bad lungs" couldn't wake him up   Heart disease Father    Heart attack Father 57   Breast cancer Maternal Grandmother    Breast cancer Maternal Aunt        x 2   Breast cancer Cousin    Pancreatic cancer Cousin    Heart attack Paternal Uncle    Stroke Paternal Grandfather      Physical Exam  Triage Vital Signs: ED Triage Vitals  Enc Vitals Group     BP      Pulse      Resp      Temp      Temp src      SpO2      Weight      Height      Head Circumference      Peak Flow      Pain Score      Pain Loc      Pain Edu?      Excl. in [place]?     Updated Vital Signs: BP 134/83   Pulse 99   Temp 98 F (36.7 C) (Oral)   Resp 18   Ht '4\' 11"'$  (1.499 m)   Wt 48.4 kg   SpO2 100%   BMI 21.55 kg/m    General: Awake, moderate distress.  CV:  RRR.  Good peripheral perfusion.  Resp:  Normal effort.  CTAB. Abd:  Nontender.  No distention.  Other:  Speaking in whispered voice secondary to generalized weakness.  Alert and oriented to person and place.  CN II-XII grossly intact.  Left hand contractured.  3/5LUE motor strength.  4/5 BLE motor  strength.   ED Results / Procedures / Treatments  Labs (all labs ordered are listed, but only abnormal results are displayed) Labs Reviewed  CBC - Abnormal; Notable for the following components:      Result Value   WBC 11.6 (*)    RBC 3.66 (*)    Hemoglobin 10.5 (*)    HCT 34.0 (*)    Platelets 546 (*)    All other components within normal limits  DIFFERENTIAL - Abnormal; Notable for the following components:   Neutro Abs 9.1 (*)    Abs Immature Granulocytes 0.15 (*)    All other components within normal limits  COMPREHENSIVE METABOLIC PANEL - Abnormal; Notable for the following components:   CO2 21 (*)    Glucose, Bld 179 (*)    Creatinine, Ser 1.79 (*)    GFR, Estimated 28 (*)    All other components within normal limits  CBG MONITORING, ED - Abnormal; Notable for the following components:   Glucose-Capillary 139 (*)    All other components within normal limits  CULTURE, BLOOD (ROUTINE X 2)  CULTURE, BLOOD (ROUTINE X 2)  ETHANOL  PROTIME-INR  APTT  URINE DRUG SCREEN, QUALITATIVE ([facility] ONLY)  URINALYSIS, ROUTINE W REFLEX MICROSCOPIC  LACTIC ACID, PLASMA  LACTIC ACID, PLASMA  PROCALCITONIN  TROPONIN I (HIGH SENSITIVITY)  TROPONIN I (HIGH SENSITIVITY)     EKG  ED ECG REPORT I, Klyde Banka J, the attending physician, personally viewed and interpreted this ECG.   Date:  11/23/2022  EKG Time: 0207  Rate: 72  Rhythm: normal sinus rhythm  Axis: Normal   Intervals:none  ST&T Change: Nonspecific    RADIOLOGY I have independently visualized and interpreted patient's CT and chest x-ray as well as noted the radiology interpretation:  CT head code stroke discussed with Dr. Collins [place]: No ICH  Chest x-ray: suspicious for mild pneumonia  CTA Head/Neck: Right vertebral artery occlusion  Official radiology report(s): CT ANGIO HEAD NECK W WO CM W PERF (CODE STROKE)  Addendum Date: 11/23/2022   ADDENDUM REPORT: 11/23/2022 03:55 ADDENDUM: These results were called by  telephone at the time of interpretation on 11/23/2022 at 3:54 am to provider Marry Guan , who verbally acknowledged these results. Electronically Signed   By: Ulyses Jarred M.D.   On: 11/23/2022 03:55   Result Date: 11/23/2022 CLINICAL DATA:  Syncope EXAM: CT ANGIOGRAPHY HEAD AND NECK TECHNIQUE: Multidetector CT imaging of the head and neck was performed using the standard protocol during bolus administration of intravenous contrast. Multiplanar CT image reconstructions and MIPs were obtained to evaluate the vascular anatomy. Carotid stenosis measurements (when applicable) are obtained utilizing NASCET criteria, using the distal internal carotid diameter as the denominator. RADIATION DOSE REDUCTION: This exam was performed according to the departmental dose-optimization program which includes automated exposure control, adjustment of the mA and/or kV according to patient size and/or use of iterative reconstruction technique. 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 No acute fracture. OTHER NECK: Normal pharynx, larynx and major salivary glands. No cervical lymphadenopathy. Unremarkable thyroid gland. UPPER CHEST: No pneumothorax or pleural effusion. No nodules or masses. AORTIC ARCH: There is no calcific atherosclerosis of the aortic arch. There is no aneurysm, dissection or hemodynamically significant stenosis of the visualized portion of the aorta. Conventional 3 vessel aortic branching pattern. The visualized proximal subclavian arteries are widely patent. RIGHT CAROTID SYSTEM: Normal without aneurysm, dissection or stenosis. LEFT CAROTID SYSTEM: Normal without aneurysm, dissection or stenosis. VERTEBRAL ARTERIES: Left dominant configuration. Both origins are clearly patent. There is enhancement of the right V2 segment which becomes occluded distally and remains  occluded to the vertebrobasilar junction. CTA HEAD FINDINGS POSTERIOR CIRCULATION: --Vertebral arteries: Right V4 segment is occluded.  Left is normal. --Inferior cerebellar arteries: Normal. --Basilar artery: Normal. --Superior cerebellar arteries: Normal. --Posterior cerebral arteries (PCA): Normal. ANTERIOR CIRCULATION: --Intracranial internal carotid arteries: Normal. --Anterior cerebral arteries (ACA): Normal. Both A1 segments are present. Patent anterior communicating artery (a-comm). --Middle cerebral arteries (MCA): Normal. VENOUS SINUSES: As permitted by contrast timing, patent. ANATOMIC VARIANTS: None Review of the MIP images confirms the above findings. IMPRESSION: 1. 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 2. No other intracranial arterial occlusion or high-grade stenosis. 3. 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 Electronically Signed: By: KUlyses JarredM.D. On: 11/23/2022 03:31   DG Chest Port 1 View  Result Date: 11/23/2022 CLINICAL DATA:  Weakness, syncope EXAM: PORTABLE CHEST 1 VIEW COMPARISON:  05/15/2022 FINDINGS: Mild patchy/interstitial opacities, right upper lobe predominant. No pleural effusion or pneumothorax. The heart is normal in size. IMPRESSION: Mild patchy/interstitial opacities, right upper lobe predominant, suspicious for mild pneumonia. Electronically Signed   By: SJulian HyM.D.   On: 11/23/2022 03:23   CT HEAD CODE STROKE WO CONTRAST  Result Date: 11/23/2022 CLINICAL DATA:  Code stroke. EXAM: CT HEAD WITHOUT CONTRAST TECHNIQUE: Contiguous axial  images were obtained from the base of the skull through the vertex without intravenous contrast. RADIATION DOSE REDUCTION: This exam was performed according to the departmental dose-optimization program which includes automated exposure control, adjustment of the mA and/or kV according to patient size and/or use of iterative reconstruction  technique. COMPARISON:  None Available. FINDINGS: Brain: There is no mass, hemorrhage or extra-axial collection. The size and configuration of the ventricles and extra-axial CSF spaces are normal. 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 Vascular: No abnormal hyperdensity of the major intracranial arteries or dural venous sinuses. No intracranial atherosclerosis. Skull: The visualized skull base, calvarium and extracranial soft tissues are normal. Sinuses/Orbits: No fluid levels or advanced mucosal thickening of the visualized paranasal sinuses. No mastoid or middle ear effusion. The orbits are normal. ASPECTS [facility] Stroke Program Early CT Score) - Ganglionic level infarction (caudate, lentiform nuclei, internal capsule, insula, M1-M3 cortex): 7 - Supraganglionic infarction (M4-M6 cortex): 3 Total score (0-10 with 10 being normal): 10 IMPRESSION: 1. No acute intracranial abnormality. 2. 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 3. ASPECTS is 10. These results were called by telephone at the time of interpretation on 11/23/2022 at 2:04 am to provider Jamea Robicheaux , who verbally acknowledged these results. Electronically Signed   By: Ulyses Jarred M.D.   On: 11/23/2022 02:05     PROCEDURES:  Critical Care performed: Yes, see critical care procedure note(s)  CRITICAL CARE Performed by: Paulette Blanch   Total critical care time: 60 minutes  Critical care time was exclusive of separately billable procedures and treating other patients.  Critical care was necessary to treat or prevent imminent or life-threatening deterioration.  Critical care was time spent personally by me on the following activities: development of treatment plan with patient and/or surrogate as well as nursing, discussions with consultants, evaluation of patient's response to treatment, examination of patient, obtaining history from patient or surrogate, ordering and performing treatments and interventions, ordering and  review of laboratory studies, ordering and review of radiographic studies, pulse oximetry and re-evaluation of patient's condition.   Marland Kitchen1-3 Lead EKG Interpretation  Performed by: Paulette Blanch, MD Authorized by: Paulette Blanch, MD     Interpretation: normal     ECG rate:  75   ECG rate assessment: normal     Rhythm: sinus rhythm     Ectopy: none     Conduction: normal   Comments:     Patient placed on cardiac monitor to evaluate for arrhythmias  NIH Stroke Scale  Interval: Baseline Time: 4:09 AM Person Administering Scale: [place] Augustine J  Administer stroke scale items in the order listed. Record performance in each category after each subscale exam. Do not go back and change scores. Follow directions provided for each exam technique. Scores should reflect what the patient does, not what the clinician thinks the patient can do. The clinician should record answers while administering the exam and work quickly. Except where indicated, the patient should not be coached (i.e., repeated requests to patient to make a special effort).   1a  Level of consciousness: 0=alert; keenly responsive  1b. LOC questions:  0=Performs both tasks correctly  1c. LOC commands: 0=Performs both tasks correctly  2.  Best Gaze: 0=normal  3.  Visual: 0=No visual loss  4. Facial Palsy: 1=Minor paralysis (flattened nasolabial fold, asymmetric on smiling)  5a.  Motor left arm: 1=Drift, limb holds 90 (or 45) degrees but drifts down before full 10 seconds: does not hit bed  5b.  Motor right arm: 0=No drift, limb holds 90 (or 45) degrees for full 10 seconds  6a. motor left leg: 0=No drift, limb holds 90 (or 45) degrees for full 10 seconds  6b  Motor right leg:  1=Drift, limb holds 90 (or 45) degrees but drifts down before full 10 seconds: does not hit bed  7. Limb Ataxia: 0=Absent  8.  Sensory: 1=Mild to moderate sensory loss; patient feels pinprick is less sharp or is dull on the affected side; there is a loss of  superficial pain with pinprick but patient is aware She is being touched  9. Best Language:  1=Mild to moderate aphasia; some obvious loss of fluency or facility of comprehension without significant limitation on ideas expressed or form of expression.  10. Dysarthria: 2=Severe; patient speech is so slurred as to be unintelligible in the absence of or our of proportion to any dysphagia, or is mute/anarthric  11. Extinction and Inattention: 0=No abnormality  12. Distal motor function: 0=Normal   Total:   7     MEDICATIONS ORDERED IN ED: Medications  heparin ADULT infusion 100 units/mL (25000 units/216m) (has no administration in time range)  Ampicillin-Sulbactam (UNASYN) 3 g in sodium chloride 0.9 % 100 mL IVPB (has no administration in time range)  heparin ADULT infusion 100 units/mL (25000 units/2562m (has no administration in time range)  ondansetron (ZOFRAN) injection 4 mg ( Intravenous Given 11/23/22 0204)  iohexol (OMNIPAQUE) 350 MG/ML injection 100 mL (100 mLs Intravenous Contrast Given 11/23/22 0259)  sodium chloride 0.9 % bolus 1,000 mL (1,000 mLs Intravenous New Bag/Given 11/23/22 0338)  metoCLOPramide (REGLAN) injection 5 mg (5 mg Intravenous Given 11/23/22 0338)     IMPRESSION / MDM / ASSESSMENT AND PLAN / ED COURSE  I reviewed the triage vital signs and the nursing notes.                             [age]ear old female presenting with syncope and left-sided weakness, history of stroke 3 months ago on Plavix.  Differential diagnosis includes but is not limited to [place], ACS, metabolic, infectious etiologies, etc.  I have personally reviewed patient's records and notes and PCP office visit on 10/24/2022 for annual wellness exam.  Patient's presentation is most consistent with acute presentation with potential threat to life or bodily function.  The patient is on the cardiac monitor to evaluate for evidence of arrhythmia and/or significant heart rate changes.  ED code stroke  initiated immediately upon patient's arrival to the emergency department.  Will obtain lab work, urgent CT head and activate teleneurology for evaluation.  Clinical Course as of 11/23/22 0409  Sun Nov 23, 2022  0215 Patient became unresponsive, nurse reports questionable seizure activity. Spouse at bedside. Discussed advanced directives - spouse reports patient is DNR. Specifically we discussed that patient would not want intubation, chest compressions or resuscitative medications. Patient arousable with sternal rub. [JS]  [id number] case with teleneurologist Dr. GrHelane RimaAfter much teasing out, last known well is likely at the 48-hour mark.  Patient was not a candidate for tPA for this reason.  She was sent for stat CTA head and neck which demonstrates distal vertebral artery occlusion not amenable to intervention.  Less likely seizure.  Recommends heparin without bolus and repeat imaging in 3 days time.  Recommend stat CT head for acute neurologic deterioration.  Appreciate teleneurology consult.  Will discuss case with hospitalist services for evaluation and admission. [  JS]  0408 CXR suspicious for infiltrate. Will obtain blood cx, lactate and cover for aspiration with IV Unasyn. [JS]    Clinical Course User Index [JS] Paulette Blanch, MD     FINAL CLINICAL IMPRESSION(S) / ED DIAGNOSES   Final diagnoses:  Weakness  Syncope, unspecified syncope type  AKI (acute kidney injury) ([place])  Cerebrovascular accident (CVA), unspecified mechanism ([place])  Nausea and vomiting, unspecified vomiting type  Aspiration pneumonia, unspecified aspiration pneumonia type, unspecified laterality, unspecified part of lung ([employer])     Rx / DC Orders   ED Discharge Orders     None        Note:  This document was prepared using Dragon voice recognition software and may include unintentional dictation errors.   Paulette Blanch, MD 11/23/22 [phone]

## 2022-11-23 NOTE — Progress Notes (Signed)
MD notified due to patients CBG of 67. Hypoglycemic order set initiated. Patient was given 12.5g of Dextrose . Rechecked patients blood sugar it came up to 119.Marland Kitchen No new orders in place.

## 2022-11-23 NOTE — Progress Notes (Signed)
SLP Cancellation Note  Patient Details Name: ADISON REIFSTECK MRN: [id number] DOB: [birth year]   Cancelled treatment:       Reason Eval/Treat Not Completed: Medical issues which prohibited therapy (SLP consult received and appreciated. Chart review completed. Cognitive-linguistic evaluation deferred at this time due to n/v. Will continue efforts as appropriate.)  Cherrie Gauze, M.S., [facility] [id number] [place])   Quintella Baton 11/23/2022, 12:17 PM

## 2022-11-23 NOTE — ED Notes (Signed)
Advised nurse that patient has ready bed 

## 2022-11-23 NOTE — H&P (Signed)
History and Physical    [name] DOB: Dec 28, 1940 DOA: 11/23/2022  Referring MD/NP/PA:   PCP: Idelle Crouch, MD   Patient coming from:  The patient is coming from home.  At baseline, pt is independent for most of ADL.        Chief Complaint: worsening left-sided weakness, nausea, vomiting, abdominal pain.  HPI: [name] is a 81 y.o. female with medical history significant of recent stroke with left-sided weakness, hypertension, hyperlipidemia, asthma, GERD, hypothyroidism, depression with anxiety, seizure, pancreatitis, anemia, dCHF, who presents with worsening left-sided weakness.  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 Per husband, pt got up around 1 AM this morning and went to use the restroom, started feeling worsening left-sided weakness, fainted for few minutes.  Not completely lost consciousness.  Patient also had slurred speech.  No vision loss or hearing loss. Patient does not have chest pain, fever or chills.  Patient has multiple episodes of nonbilious nonbloody vomiting.  No diarrhea.  Husband states that the patient has intermittent abdominal pain in the past few weeks, which is located in the central abdomen, mild to moderate, sharp, nonradiating.  After vomiting, patient started coughing with little mucus production.  No symptoms of UTI.  Denies dark stool or rectal bleeding.  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  Tele neurology was consulted, recommended IV heparin with no bolus.   Addendum: later on, pt continues to have nausea and vomiting, her vomiting seems to have turned to be coffee-ground looking.  Data reviewed independently and ED Course: pt was found to have WBC 11.6, lactic acid 11.9, INR 1.0, PTT 26, troponin level 7, 10, UDS negative, negative urinalysis, alcohol level less than 10, AKI with creatinine 1.79, BUN 22, GFR 29 (recent baseline creatinine 0.9 09/05/2022),  temperature normal, blood pressure 145/75, heart rate 102, RR 25, oxygen saturation 99% on room air. Pt is admitted to telemetry bed as inpatient.  Dr. Curly Shores of neurology is consulted.  CT-abd/pelvis: 1. 8.4 cm mass-like inflammatory/edematous changes in the anterior pelvic fat cephalad to the urinary bladder, possibly contusion if history of regional trauma; neoplasm eg liposarcoma would be a less common etiology. 2. Colonic diverticulosis. 3. Small hiatal hernia. 4.  Aortic Atherosclerosis (ICD10-I70.0).   EKG: I have personally reviewed.  Sinus rhythm, PAC, QTc 446, nonspecific T wave change.   Review of Systems:   General: no fevers, chills, no body weight gain, has poor appetite, has fatigue HEENT: no blurry vision, hearing changes or sore throat Respiratory: no dyspnea, has coughing, no wheezing CV: no chest pain, no palpitations GI: has nausea, vomiting, abdominal pain, no diarrhea, constipation GU: no dysuria, burning on urination, increased urinary frequency, hematuria  Ext: no leg edema Neuro:  no vision change or hearing loss. Has left-sided weakness and slurred speech Skin: no rash, no skin tear. MSK: No muscle spasm, no deformity, no limitation of range of movement in spin Heme: No easy bruising.  Travel history: No recent long distant travel.   Allergy:  Allergies  Allergen Reactions   Cinobac [Cinoxacin] Anaphylaxis    "My throat swelled and I stopped breathing."   Latex Dermatitis    IgE = 14 (WNL) on 04/17/2022 Pt is highly reactive to any latex products. Blisters skin. "Takes my skin off"   Other Shortness Of Breath    Muscle relaxers? Pt thinks allergic to muscle relaxers, reports stops breathing but not sure what  medicine and if for sure muscle relaxer   Petrolatum Distillates [Petroleum Distillate] Shortness Of Breath    Passes out    Sulfa Antibiotics Itching   Meloxicam     Other Reaction(s): Not available, Unknown   Nylon Itching    Past  Medical History:  Diagnosis Date   Anemia    Arthritis    Asthma    uses inhaler just prior to surgery to avoid attack   Back pain    from previous injury   Complication of anesthesia    has woken  up during 2 different surgery   Depression    no current issue/treatment; situation   Gallstones    GERD (gastroesophageal reflux disease)    Hiatal hernia    patient does NOT have nerve/muscle disease   History of kidney stones    HLD (hyperlipidemia)    HTN (hypertension)    Hypothyroidism    Kidney stones    Knee pain    Non-diabetic pancreatic hormone dysfunction years   pt. 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   Pancreatitis    Pneumonia    Seizures (HCC)    caused by dye injected during a procedure   Shortness of breath    with exertion   Sinus problem    frequent infections/congestion   Stroke ([place]) 2021   reports having CVA in 2021 and having mini strokes before that   Thyroid disease     Past Surgical History:  Procedure Laterality Date   ABDOMINAL HYSTERECTOMY     APPENDECTOMY     CARPAL TUNNEL RELEASE  10+ years ago   bilateral   EYE SURGERY  3 yrs ago   bilateral cataracts   FOOT OSTEOTOMY  6 weeks ago   Left foot: great, 2nd & 3rd   FOOT OSTEOTOMY  5 years ago   Right great toe   HAND SURGERY Bilateral 2011-most recent   multiple hand surgeries, 2 on left, 3 on right   KNEE ARTHROPLASTY Right 04/28/2022   Procedure: COMPUTER ASSISTED TOTAL KNEE ARTHROPLASTY;  Surgeon: Dereck Leep, MD;  Location: [facility];  Service: Orthopedics;  Laterality: Right;   LOOP RECORDER INSERTION N/A 05/16/2020   Procedure: LOOP RECORDER INSERTION;  Surgeon: Isaias Cowman, MD;  Location: [facility];  Service: Cardiovascular;  Laterality: N/A;   NASAL SINUS SURGERY  most recent 7-8 yrs ago   7 sinus surgeries    TRIGGER FINGER RELEASE  11/19/2011   Procedure: RELEASE TRIGGER FINGER/A-1 PULLEY;  Surgeon: Wynonia Sours, MD;  Location: [facility];  Service: Orthopedics;  Laterality: Right;  release a-1 pulley right index finger and cyst removal   WRIST GANGLION EXCISION  1980's   right    Social History:  reports that she has never smoked. She has never used smokeless tobacco. She reports that she does not currently use alcohol. She reports that she does not use drugs.  Family History:  Family History  Problem Relation Age of Onset   Anesthesia problems Father        "bad lungs" couldn't wake him up   Heart disease Father    Heart attack Father 1   Breast cancer Maternal Grandmother    Breast cancer Maternal Aunt        x 2   Breast cancer Cousin    Pancreatic cancer Cousin    Heart attack Paternal Uncle    Stroke Paternal Grandfather      Prior to Admission  medications   Medication Sig Start Date End Date Taking? Authorizing Provider  albuterol (PROVENTIL HFA;VENTOLIN HFA) 108 (90 Base) MCG/ACT inhaler Inhale 2 puffs into the lungs every 4 (four) hours as needed for wheezing or shortness of breath.     [provider]  amLODipine (NORVASC) 10 MG tablet TAKE 1 TABLET BY MOUTH EVERY DAY 09/29/22   Gertie Gowda, DO  aspirin EC 81 MG tablet Take 1 tablet (81 mg total) by mouth daily. Swallow whole. 09/05/22   Love, Ivan Anchors, PA-C  atorvastatin (LIPITOR) 40 MG tablet TAKE 1 TABLET BY MOUTH EVERY DAY 09/29/22   Durel Salts C, DO  clopidogrel (PLAVIX) 75 MG tablet Take 1 tablet (75 mg total) by mouth daily. 09/05/22   Love, Ivan Anchors, PA-C  diclofenac Sodium (VOLTAREN) 1 % GEL Apply 2 g topically 3 (three) times daily. 09/05/22   Love, Ivan Anchors, PA-C  DULoxetine (CYMBALTA) 30 MG capsule Take 1 capsule (30 mg total) by mouth daily. 09/05/22   Love, Ivan Anchors, PA-C  folic acid (FOLVITE) 629 MCG tablet Take 1 tablet (800 mcg total) by mouth daily. 09/05/22   Love, Ivan Anchors, PA-C  gabapentin (NEURONTIN) 300 MG capsule Take 1 capsule (300 mg total) by mouth 3 (three) times daily. 09/05/22   Love, Ivan Anchors, PA-C   levothyroxine (SYNTHROID) 50 MCG tablet Take 1 tablet (50 mcg total) by mouth daily before breakfast. 09/05/22   Love, Ivan Anchors, PA-C  losartan (COZAAR) 50 MG tablet TAKE 1 TABLET BY MOUTH EVERY DAY 11/04/22   Durel Salts C, DO  montelukast (SINGULAIR) 10 MG tablet Take 1 tablet (10 mg total) by mouth daily. 09/05/22   Love, Ivan Anchors, PA-C  Multiple Vitamins-Minerals (MULTIVITAMIN GUMMIES ADULT) CHEW Chew 2 capsules by mouth daily.    [provider]  pantoprazole (PROTONIX) 40 MG tablet TAKE 1 TABLET (40 MG TOTAL) BY MOUTH SEE ADMIN INSTRUCTIONS. 09/29/22 11/20/26  Durel Salts C, DO  potassium chloride (KLOR-CON M10) 10 MEQ tablet TAKE 2 TABLETS BY MOUTH 2 TIMES DAILY. 09/29/22   Gertie Gowda, DO  predniSONE (DELTASONE) 10 MG tablet Take 1 tablet (10 mg total) by mouth daily. 09/05/22   Love, Ivan Anchors, PA-C  propranolol ER (INDERAL LA) 60 MG 24 hr capsule Take 1 capsule (60 mg total) by mouth daily. 09/05/22   Bary Leriche, PA-C    Physical Exam: Vitals:   11/23/22 1200 11/23/22 1300 11/23/22 1400 11/23/22 1500  BP: 133/68 130/61 (!) 126/52 (!) 129/59  Pulse: 88 90 84 87  Resp: (!) 24 16 (!) 30 19  Temp:      TempSrc:      SpO2: 98% 98% 99% 98%  Weight:      Height:       General: Not in acute distress HEENT:       Eyes: PERRL, EOMI, no scleral icterus.       ENT: No discharge from the ears and nose, no pharynx injection, no tonsillar enlargement.        Neck: No JVD, no bruit, no mass felt. Heme: No neck lymph node enlargement. Cardiac: S1/S2, RRR, No murmurs, No gallops or rubs. Respiratory: Coarse breathing sound bilaterally GI: Soft, nondistended, has central abdominal tenderness, no rebound pain, no organomegaly, BS present. GU: No hematuria Ext: No pitting leg edema bilaterally. 1+DP/PT pulse bilaterally. Musculoskeletal: No joint deformities, No joint redness or warmth, no limitation of ROM in spin. Skin: No rashes.  Neuro: Alert, oriented X3, cranial  nerves II-XII grossly intact, muscle strength of left arm 1/5, 4/5 in the left leg.  Right extremity is normal. Psych: Patient is not psychotic, no suicidal or hemocidal ideation.  Labs on Admission: I have personally reviewed following labs and imaging studies  CBC: Recent Labs  Lab 11/23/22 0204  WBC 11.6*  NEUTROABS 9.1*  HGB 10.5*  HCT 34.0*  MCV 92.9  PLT 062*   Basic Metabolic Panel: Recent Labs  Lab 11/23/22 0204  NA 142  K 3.7  CL 109  CO2 21*  GLUCOSE 179*  BUN 22  CREATININE 1.79*  CALCIUM 9.6   GFR: Estimated Creatinine Clearance: 17.1 mL/min (A) (by C-G formula based on SCr of 1.79 mg/dL (H)). Liver Function Tests: Recent Labs  Lab 11/23/22 0204  AST 34  ALT 24  ALKPHOS 64  BILITOT 0.9  PROT 6.9  ALBUMIN 3.8   Recent Labs  Lab 11/23/22 0412  LIPASE 32   No results for input(s): "AMMONIA" in the last 168 hours. Coagulation Profile: Recent Labs  Lab 11/23/22 0204  INR 1.0   Cardiac Enzymes: No results for input(s): "CKTOTAL", "CKMB", "CKMBINDEX", "TROPONINI" in the last 168 hours. BNP (last 3 results) No results for input(s): "PROBNP" in the last 8760 hours. HbA1C: No results for input(s): "HGBA1C" in the last 72 hours. CBG: Recent Labs  Lab 11/23/22 0150  GLUCAP 139*   Lipid Profile: No results for input(s): "CHOL", "HDL", "LDLCALC", "TRIG", "CHOLHDL", "LDLDIRECT" in the last 72 hours. Thyroid Function Tests: No results for input(s): "TSH", "T4TOTAL", "FREET4", "T3FREE", "THYROIDAB" in the last 72 hours. Anemia Panel: No results for input(s): "VITAMINB12", "FOLATE", "FERRITIN", "TIBC", "IRON", "RETICCTPCT" in the last 72 hours. Urine analysis:    Component Value Date/Time   COLORURINE STRAW (A) 11/23/2022 0204   APPEARANCEUR CLEAR (A) 11/23/2022 0204   LABSPEC 1.026 11/23/2022 0204   PHURINE 6.0 11/23/2022 0204   GLUCOSEU 50 (A) 11/23/2022 0204   HGBUR MODERATE (A) 11/23/2022 0204   BILIRUBINUR NEGATIVE 11/23/2022 0204    KETONESUR NEGATIVE 11/23/2022 0204   PROTEINUR NEGATIVE 11/23/2022 0204   UROBILINOGEN 0.2 04/11/2011 1030   NITRITE NEGATIVE 11/23/2022 0204   LEUKOCYTESUR NEGATIVE 11/23/2022 0204   Sepsis Labs: '@LABRCNTIP'$ (procalcitonin:4,lacticidven:4) ) Recent Results (from the past 240 hour(s))  Culture, blood (routine x 2)     Status: None (Preliminary result)   Collection Time: 11/23/22  4:12 AM   Specimen: BLOOD RIGHT ARM  Result Value Ref Range Status   Specimen Description BLOOD RIGHT ARM  Final   Special Requests   Final    BOTTLES DRAWN AEROBIC AND ANAEROBIC Blood Culture adequate volume   Culture   Final    NO GROWTH < 12 HOURS Performed at [facility], [address], [place], [place] [address]    Report Status PENDING  Incomplete  Culture, blood (routine x 2)     Status: None (Preliminary result)   Collection Time: 11/23/22  4:12 AM   Specimen: BLOOD RIGHT ARM  Result Value Ref Range Status   Specimen Description BLOOD RIGHT ARM  Final   Special Requests   Final    BOTTLES DRAWN AEROBIC AND ANAEROBIC Blood Culture adequate volume   Culture   Final    NO GROWTH < 12 HOURS Performed at [facility], [address], [place], [place] [address]    Report Status PENDING  Incomplete     Radiological Exams on Admission: CT ABDOMEN PELVIS WO CONTRAST  Result Date: 11/23/2022 CLINICAL DATA:  Abdominal  pain, acute, nonlocalized EXAM: CT ABDOMEN AND PELVIS WITHOUT CONTRAST TECHNIQUE: Multidetector CT imaging of the abdomen and pelvis was performed following the standard protocol without IV contrast. RADIATION DOSE REDUCTION: This exam was performed according to the departmental dose-optimization program which includes automated exposure control, adjustment of the mA and/or kV according to patient size and/or use of iterative reconstruction technique. COMPARISON:  07/30/2004 by report only FINDINGS: Lower chest: No pleural or pericardial effusion. Small hiatal hernia.  Visualized lung bases clear. Hepatobiliary: Hyperdense gallbladder contents probably vicarious excretion of previously administered contrast material. No focal liver lesion or biliary ductal dilatation. Pancreas: Unremarkable. No pancreatic ductal dilatation or surrounding inflammatory changes. Spleen: Normal in size without focal abnormality. Adrenals/Urinary Tract: No adrenal mass. Mildly striated appearance of kidneys suggesting renal dysfunction. No hydronephrosis. Residual contrast material in the distended urinary bladder. Stomach/Bowel: Small hiatal hernia involving gastric fundus. Small bowel decompressed. Appendix not identified. The colon is incompletely distended by gas and fecal material with scattered diverticula; no adjacent inflammatory/edematous change. Vascular/Lymphatic: Scattered aortoiliac calcified atheromatous plaque without aneurysm. No abdominal or pelvic adenopathy. Reproductive: Post hysterectomy. 4.8 cm simple appearing left lower quadrant cyst abutting distal sigmoid colon, described on previous report of 07/30/2004 measured 4 cm, presumably benign adnexal or duplication cyst given significant stability over time. Other: No ascites.  No free air. Musculoskeletal: Poorly marginated streaky inflammatory/edematous changes in 8.4 cm mass-like anterior pelvic fat cephalad to the urinary bladder. Mild lumbar dextroscoliosis apex L2 with multilevel spondylitic change. Bilateral hip DJD. IMPRESSION: 1. 8.4 cm mass-like inflammatory/edematous changes in the anterior pelvic fat cephalad to the urinary bladder, possibly contusion if history of regional trauma; neoplasm eg liposarcoma would be a less common etiology. 2. Colonic diverticulosis. 3. Small hiatal hernia. 4.  Aortic Atherosclerosis (ICD10-I70.0). Electronically Signed   By: Lucrezia Europe M.D.   On: 11/23/2022 10:20   CT ANGIO HEAD NECK W WO CM W PERF (CODE STROKE)  Addendum Date: 11/23/2022   ADDENDUM REPORT: 11/23/2022 03:55 ADDENDUM:  These results were called by telephone at the time of interpretation on 11/23/2022 at 3:54 am to provider Marry Guan , who verbally acknowledged these results. Electronically Signed   By: Ulyses Jarred M.D.   On: 11/23/2022 03:55   Result Date: 11/23/2022 CLINICAL DATA:  Syncope EXAM: CT ANGIOGRAPHY HEAD AND NECK TECHNIQUE: Multidetector CT imaging of the head and neck was performed using the standard protocol during bolus administration of intravenous contrast. Multiplanar CT image reconstructions and MIPs were obtained to evaluate the vascular anatomy. Carotid stenosis measurements (when applicable) are obtained utilizing NASCET criteria, using the distal internal carotid diameter as the denominator. RADIATION DOSE REDUCTION: This exam was performed according to the departmental dose-optimization program which includes automated exposure control, adjustment of the mA and/or kV according to patient size and/or use of iterative reconstruction technique. 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 No acute fracture. OTHER NECK: Normal pharynx, larynx and major salivary glands. No cervical lymphadenopathy. Unremarkable thyroid gland. UPPER CHEST: No pneumothorax or pleural effusion. No nodules or masses. AORTIC ARCH: There is no calcific atherosclerosis of the aortic arch. There is no aneurysm, dissection or hemodynamically significant stenosis of the visualized portion of the aorta. Conventional 3 vessel aortic branching pattern. The visualized proximal subclavian arteries are widely patent. RIGHT CAROTID SYSTEM: Normal without aneurysm, dissection or stenosis. LEFT CAROTID SYSTEM: Normal without aneurysm, dissection or stenosis. VERTEBRAL ARTERIES: Left  dominant configuration. Both origins are clearly patent. There is enhancement of the right V2 segment which becomes occluded  distally and remains occluded to the vertebrobasilar junction. CTA HEAD FINDINGS POSTERIOR CIRCULATION: --Vertebral arteries: Right V4 segment is occluded.  Left is normal. --Inferior cerebellar arteries: Normal. --Basilar artery: Normal. --Superior cerebellar arteries: Normal. --Posterior cerebral arteries (PCA): Normal. ANTERIOR CIRCULATION: --Intracranial internal carotid arteries: Normal. --Anterior cerebral arteries (ACA): Normal. Both A1 segments are present. Patent anterior communicating artery (a-comm). --Middle cerebral arteries (MCA): Normal. VENOUS SINUSES: As permitted by contrast timing, patent. ANATOMIC VARIANTS: None Review of the MIP images confirms the above findings. IMPRESSION: 1. 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 2. No other intracranial arterial occlusion or high-grade stenosis. 3. 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 Electronically Signed: By: Ulyses Jarred M.D. On: 11/23/2022 03:31   DG Chest Port 1 View  Result Date: 11/23/2022 CLINICAL DATA:  Weakness, syncope EXAM: PORTABLE CHEST 1 VIEW COMPARISON:  05/15/2022 FINDINGS: Mild patchy/interstitial opacities, right upper lobe predominant. No pleural effusion or pneumothorax. The heart is normal in size. IMPRESSION: Mild patchy/interstitial opacities, right upper lobe predominant, suspicious for mild pneumonia. Electronically Signed   By: Julian Hy M.D.   On: 11/23/2022 03:23   CT HEAD CODE STROKE WO CONTRAST  Result Date: 11/23/2022 CLINICAL DATA:  Code stroke. EXAM: CT HEAD WITHOUT CONTRAST TECHNIQUE: Contiguous axial images were obtained from the base of the skull through the vertex without intravenous contrast. RADIATION DOSE REDUCTION: This exam was performed according to the departmental dose-optimization program which includes automated exposure control, adjustment of the mA and/or kV according to patient size and/or use of  iterative reconstruction technique. COMPARISON:  None Available. FINDINGS: Brain: There is no mass, hemorrhage or extra-axial collection. The size and configuration of the ventricles and extra-axial CSF spaces are normal. 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 Vascular: No abnormal hyperdensity of the major intracranial arteries or dural venous sinuses. No intracranial atherosclerosis. Skull: The visualized skull base, calvarium and extracranial soft tissues are normal. Sinuses/Orbits: No fluid levels or advanced mucosal thickening of the visualized paranasal sinuses. No mastoid or middle ear effusion. The orbits are normal. ASPECTS [facility] Stroke Program Early CT Score) - Ganglionic level infarction (caudate, lentiform nuclei, internal capsule, insula, M1-M3 cortex): 7 - Supraganglionic infarction (M4-M6 cortex): 3 Total score (0-10 with 10 being normal): 10 IMPRESSION: 1. No acute intracranial abnormality. 2. 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 3. ASPECTS is 10. These results were called by telephone at the time of interpretation on 11/23/2022 at 2:04 am to provider JADE SUNG , who verbally acknowledged these results. Electronically Signed   By: Ulyses Jarred M.D.   On: 11/23/2022 02:05      Assessment/Plan Principal Problem:   Stroke [facility]) Active Problems:   Occlusion of right vertebral artery   Aspiration pneumonia (HCC)   Abdominal pain   Coffee ground emesis   Essential hypertension   Acquired hypothyroidism   HLD (hyperlipidemia)   Asthma   Chronic diastolic CHF (congestive heart failure) (HCC)   Normocytic anemia   AKI (acute kidney injury) ([place])   Depression with anxiety   Mesenteric mass   Assessment and Plan:  Occlusion of right vertebral artery and possible new stroke: consulted Dr. Curly Shores of neuro.  -Admitted to tele bed as inpatient - will hold oral Bp meds to allow permissive HTN - hold Plavix due to coffee-ground emesis -  IV heparin was started  in ED, due to coffee-ground emesis, discontinued IV heparin per Dr. Curly Shores - fasting lipid panel and HbA1c  - swallowing screen. If fails, will get SLP - PT/OT consult -Dr. Lyn Records recommendations as follows: -MRI brain without contrast -MRI C-spine without contrast -TTE, consider TEE if negative and no other source of embolic phenomenon discovered -Stop heparin drip for now -Hold antiplatelet for now pending medical clearance (note home antiplatelet is plavix monotherapy)    Aspiration pneumonia ([place]): -Unasyn -Sputum culture and blood culture -As needed albuterol and Mucinex  Abdominal pain: CT scan showed  8.4 cm mass-like inflammatory/edematous changes in the anterior pelvic fat cephalad to the urinary bladder, possibly contusion if history of regional trauma; neoplasm eg liposarcoma would be a less common etiology. -Check lipase -Follow-up CT scan of abdomen/pelvis to rule out small bowel obstruction -As needed morphine and Zofran - consulted Dr. Hampton Abbot  Coffee-ground emesis: Hemoglobin dropped from 12.2 on 09/04/2022 to 10.5. - Start IV pantoprazole 40 bid bid - Zofran IV for nausea - Avoid NSAIDs and SQ heparin - Maintain IV access (2 large bore IVs if possible). - Monitor closely and follow q6h cbc, transfuse as necessary, if Hgb<7.0 - [facility]: INR, PTT and type screen - consulted Dr. Virgina Jock of GI  Essential hypertension -IV hydralazine as needed for SBP>220 or dBP>110  -Hold home blood pressure medications currently including amlodipine, Cozaar, propranolol  Acquired hypothyroidism -Synthroid  HLD (hyperlipidemia) -Lipitor  Asthma -Bronchodilators  Chronic diastolic CHF (congestive heart failure) ([place]): 2D echo on 08/20/2022 showed EF of 55-8% with grade 1 diastolic dysfunction.  Patient does not have leg edema JVD.  CHF seem to be compensated. -Check BNP  Normocytic anemia: Hemoglobin 10.5 (12.2 on 09/04/2022), denies any dark stool or rectal bleeding. -Follow-up  with CBC  AKI (acute kidney injury) ([place]): Likely due to dehydration and continuation of Cozaar. -Cozaar on hold -IV fluid: 1 L normal saline, then 75 cc/h  Depression with anxiety -Continue home medications     DVT ppx: on IV Heparin    Code Status: DNR per pt and her husband  Family Communication:   Yes, patient's husband at bed side.   Disposition Plan:  Anticipate discharge back to previous environment  Consults called:  Dr. Curly Shores of neuro  Admission status and Level of care: Telemetry Medical:     as inpt      Dispo: The patient is from: Home              Anticipated d/c is to: Home              Anticipated d/c date is: 2 days              Patient currently is not medically stable to d/c.    Severity of Illness:  The appropriate patient status for this patient is INPATIENT. Inpatient status is judged to be reasonable and necessary in order to provide the required intensity of service to ensure the patient's safety. The patient's presenting symptoms, physical exam findings, and initial radiographic and laboratory data in the context of their chronic comorbidities is felt to place them at high risk for further clinical deterioration. Furthermore, it is not anticipated that the patient will be medically stable for discharge from the hospital within 2 midnights of admission.   * I certify that at the point of admission it is my clinical judgment that the patient will require inpatient hospital care spanning beyond 2 midnights from the point  of admission due to high intensity of service, high risk for further deterioration and high frequency of surveillance required.*       Date of Service 11/23/2022    [facility]   If 7PM-7AM, please contact night-coverage [email or web address] 11/23/2022, 7:00 PM

## 2022-11-23 NOTE — Progress Notes (Signed)
Chaplain responded to Code Stroke. Provided ministry of presence and hospitality for spouse. Husband [name]) indicates that pt has been depressed since her brief improvement after the stroke 3 months ago turned around and she has declined.  Pt, who was always very active, has begun to tell spouse that she's tired and just wants to "go home" and be done fighting.  Chaplain offered prayer and is available for follow-up as needed or requested.  [name], [name] Pager:  [phone]     11/23/22 0300  Clinical Encounter Type  Visited With Patient and family together  Visit Type Initial  Referral From Nurse  Consult/Referral To Chaplain  Spiritual Encounters  Spiritual Needs Prayer  Stress Factors  Patient Stress Factors Exhausted;Health changes  Family Stress Factors Family relationships

## 2022-11-23 NOTE — ED Notes (Signed)
Pt vomited after swallow evaluation.

## 2022-11-23 NOTE — ED Notes (Signed)
Pt started vomiting.

## 2022-11-23 NOTE — ED Notes (Signed)
Pt transferred from main ED.  Pt did vomit once transferred to [place] room 33.  V/s unremarkable. Will continue plan of care. Spouse at bedside. Call light in place.

## 2022-11-24 ENCOUNTER — Inpatient Hospital Stay (HOSPITAL_COMMUNITY)
Admit: 2022-11-24 | Discharge: 2022-11-24 | Disposition: A | Discharge status: Home / Self Care | Payer: Medicare HMO | Attending: Neurology | Admitting: Neurology

## 2022-11-24 DIAGNOSIS — R1084 Generalized abdominal pain: Secondary | ICD-10-CM

## 2022-11-24 DIAGNOSIS — I6389 Other cerebral infarction: Secondary | ICD-10-CM

## 2022-11-24 DIAGNOSIS — K920 Hematemesis: Secondary | ICD-10-CM

## 2022-11-24 DIAGNOSIS — J690 Pneumonitis due to inhalation of food and vomit: Secondary | ICD-10-CM

## 2022-11-24 DIAGNOSIS — I639 Cerebral infarction, unspecified: Secondary | ICD-10-CM | POA: Diagnosis not present

## 2022-11-24 DIAGNOSIS — D649 Anemia, unspecified: Secondary | ICD-10-CM

## 2022-11-24 DIAGNOSIS — E785 Hyperlipidemia, unspecified: Secondary | ICD-10-CM | POA: Diagnosis not present

## 2022-11-24 DIAGNOSIS — J4520 Mild intermittent asthma, uncomplicated: Secondary | ICD-10-CM

## 2022-11-24 LAB — LIPID PANEL
Cholesterol: 138 mg/dL (ref 0–200)
HDL: 51 mg/dL (ref >40)
LDL Cholesterol: 48 mg/dL (ref 0–99)
Total CHOL/HDL Ratio: 2.7 RATIO
Triglycerides: 193 mg/dL — ABNORMAL HIGH (ref <150)
VLDL: 39 mg/dL (ref 0–40)

## 2022-11-24 LAB — HEMOGLOBIN A1C
Hgb A1c MFr Bld: 6.1 % — ABNORMAL HIGH (ref 4.8–5.6)
Mean Plasma Glucose: 128 mg/dL

## 2022-11-24 LAB — CBC
HCT: 27.2 % — ABNORMAL LOW (ref 36.0–46.0)
HCT: 29 % — ABNORMAL LOW (ref 36.0–46.0)
HCT: 29 % — ABNORMAL LOW (ref 36.0–46.0)
Hemoglobin: 8.6 g/dL — ABNORMAL LOW (ref 12.0–15.0)
Hemoglobin: 9.2 g/dL — ABNORMAL LOW (ref 12.0–15.0)
Hemoglobin: 9.3 g/dL — ABNORMAL LOW (ref 12.0–15.0)
MCH: 28.6 pg (ref 26.0–34.0)
MCH: 28.8 pg (ref 26.0–34.0)
MCH: 29.1 pg (ref 26.0–34.0)
MCHC: 31.6 g/dL (ref 30.0–36.0)
MCHC: 31.7 g/dL (ref 30.0–36.0)
MCHC: 32.1 g/dL (ref 30.0–36.0)
MCV: 90.4 fL (ref 80.0–100.0)
MCV: 90.9 fL (ref 80.0–100.0)
MCV: 90.6 fL (ref 80.0–100.0)
Platelets: 462 10*3/uL — ABNORMAL HIGH (ref 150–400)
Platelets: 486 10*3/uL — ABNORMAL HIGH (ref 150–400)
Platelets: 466 10*3/uL — ABNORMAL HIGH (ref 150–400)
RBC: 3.01 MIL/uL — ABNORMAL LOW (ref 3.87–5.11)
RBC: 3.19 MIL/uL — ABNORMAL LOW (ref 3.87–5.11)
RBC: 3.2 MIL/uL — ABNORMAL LOW (ref 3.87–5.11)
RDW: 14.8 % (ref 11.5–15.5)
RDW: 14.9 % (ref 11.5–15.5)
RDW: 14.8 % (ref 11.5–15.5)
WBC: 11.7 10*3/uL — ABNORMAL HIGH (ref 4.0–10.5)
WBC: 13.3 10*3/uL — ABNORMAL HIGH (ref 4.0–10.5)
WBC: 12.6 10*3/uL — ABNORMAL HIGH (ref 4.0–10.5)
nRBC: 0 % (ref 0.0–0.2)
nRBC: 0 % (ref 0.0–0.2)
nRBC: 0 % (ref 0.0–0.2)

## 2022-11-24 LAB — ECHOCARDIOGRAM COMPLETE
AR max vel: 1.75 cm2
AV Area VTI: 1.71 cm2
AV Area mean vel: 1.68 cm2
AV Mean grad: 6 mmHg
AV Peak grad: 11.4 mmHg
Ao pk vel: 1.69 m/s
Area-P 1/2: 4.49 cm2
Height: 59 in
S' Lateral: 2.5 cm
Weight: 1707.24 oz

## 2022-11-24 LAB — GLUCOSE, CAPILLARY
Glucose-Capillary: 67 mg/dL — ABNORMAL LOW (ref 70–99)
Glucose-Capillary: 66 mg/dL — ABNORMAL LOW (ref 70–99)
Glucose-Capillary: 158 mg/dL — ABNORMAL HIGH (ref 70–99)
Glucose-Capillary: 80 mg/dL (ref 70–99)

## 2022-11-24 LAB — BASIC METABOLIC PANEL
Anion gap: 9 (ref 5–15)
BUN: 13 mg/dL (ref 8–23)
CO2: 24 mmol/L (ref 22–32)
Calcium: 8.8 mg/dL — ABNORMAL LOW (ref 8.9–10.3)
Chloride: 111 mmol/L (ref 98–111)
Creatinine, Ser: 1.36 mg/dL — ABNORMAL HIGH (ref 0.44–1.00)
GFR, Estimated: 39 mL/min — ABNORMAL LOW (ref >60)
Glucose, Bld: 76 mg/dL (ref 70–99)
Potassium: 2.7 mmol/L — CL (ref 3.5–5.1)
Sodium: 144 mmol/L (ref 135–145)

## 2022-11-24 LAB — POTASSIUM: Potassium: 3.2 mmol/L — ABNORMAL LOW (ref 3.5–5.1)

## 2022-11-24 MED ORDER — DEXTROSE-NACL 5-0.9 % IV SOLN: INTRAVENOUS | Status: DC

## 2022-11-24 MED ORDER — CLOPIDOGREL BISULFATE 75 MG PO TABS
75.0000 mg | ORAL_TABLET | Freq: Every day | ORAL | Status: DC
  Administered 2022-11-24 – 2022-11-27 (×4): 75 mg via ORAL
  Filled 2022-11-24 (×4): qty 1

## 2022-11-24 MED ORDER — ASPIRIN 81 MG PO TBEC
81.0000 mg | DELAYED_RELEASE_TABLET | Freq: Every day | ORAL | Status: DC
  Administered 2022-11-24 – 2022-11-27 (×3): 81 mg via ORAL
  Filled 2022-11-24 (×4): qty 1

## 2022-11-24 MED ORDER — POTASSIUM CHLORIDE 10 MEQ/100ML IV SOLN
10.0000 meq | INTRAVENOUS | Status: AC
  Administered 2022-11-24 (×6): 10 meq via INTRAVENOUS
  Filled 2022-11-24 (×6): qty 100

## 2022-11-24 MED ORDER — DEXTROSE 50 % IV SOLN
12.5000 g | Freq: Once | INTRAVENOUS | Status: AC
  Administered 2022-11-24: 12.5 g via INTRAVENOUS
  Filled 2022-11-24: qty 50

## 2022-11-24 NOTE — Evaluation (Signed)
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   HLD (hyperlipidemia) 11/23/2022   Asthma 11/23/2022   Depression with anxiety 11/23/2022   Chronic diastolic CHF (congestive heart failure) ([place]) 11/23/2022   Normocytic anemia 11/23/2022   Aspiration pneumonia ([place]) 11/23/2022   AKI (acute kidney injury) ([place]) 11/23/2022   Abdominal pain 11/23/2022   Mesenteric mass 11/23/2022   Coffee ground emesis 11/23/2022   Nausea and vomiting 10/15/2022   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   Fatigue 09/17/2022   Left hemiparesis ([place]) 09/17/2022   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[place]) 08/22/2022   GERD (gastroesophageal reflux disease) 08/21/2022   Agitation 08/20/2022   Acute left-sided weakness 08/20/2022   Expressive aphasia    Stroke ([place]) 08/19/2022   Leukocytosis 08/19/2022   History of urticaria 04/28/2022   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   Primary osteoarthritis of left knee 02/24/2022   Primary osteoarthritis of right knee 02/24/2022   Lumbar spondylolysis 04/12/2020   History of CVA (cerebrovascular accident) 03/26/2020   Low back pain radiating to right lower extremity 03/21/2020   B12 deficiency 03/06/2020   Positive anti-CCP test 12/21/2019   Arthralgia 12/13/2019   Dermatitis 12/13/2019   Rheumatoid factor positive 12/13/2019   Essential hypertension 12/11/2018   Palpitations 12/11/2018   Acquired hypothyroidism 11/10/2018   Arthritis of knee 09/17/2016   Anxiety 11/22/2014   Asthma without status asthmaticus 11/22/2014   Benign neoplasm of colon, unspecified 11/22/2014   Environmental allergies 11/22/2014   Hypertriglyceridemia 11/22/2014   Hypokalemia 11/22/2014   Personal history of disease of skin and  subcutaneous tissue 11/22/2014   Past Medical History:  Past Medical History:  Diagnosis Date   Anemia    Arthritis    Asthma    uses inhaler just prior to surgery to avoid attack   Back pain    from previous injury   Complication of anesthesia    has woken  up during 2 different surgery   Depression    no current issue/treatment; situation   Gallstones    GERD (gastroesophageal reflux disease)    Hiatal hernia    patient does NOT have nerve/muscle disease   History of kidney stones    HLD (hyperlipidemia)    HTN (hypertension)    Hypothyroidism    Kidney stones    Knee pain    Non-diabetic pancreatic hormone dysfunction years   pt. 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   Pancreatitis    Pneumonia    Seizures ([place])    caused by dye injected during a procedure   Shortness of breath    with exertion   Sinus problem    frequent infections/congestion   Stroke ([place]) 2021   reports having CVA in 2021 and having mini strokes before that   Thyroid disease    Past Surgical History:  Past Surgical History:  Procedure Laterality Date   ABDOMINAL HYSTERECTOMY     APPENDECTOMY     CARPAL TUNNEL RELEASE  10+ years ago   bilateral   EYE SURGERY  3 yrs ago   bilateral cataracts   FOOT OSTEOTOMY  6 weeks ago   Left foot: great, 2nd & 3rd   FOOT OSTEOTOMY  5 years ago   Right great toe   HAND SURGERY Bilateral 2011-most  recent   multiple hand surgeries, 2 on left, 3 on right   KNEE ARTHROPLASTY Right 04/28/2022   Procedure: COMPUTER ASSISTED TOTAL KNEE ARTHROPLASTY;  Surgeon: Dereck Leep, MD;  Location: [facility];  Service: Orthopedics;  Laterality: Right;   LOOP RECORDER INSERTION N/A 05/16/2020   Procedure: LOOP RECORDER INSERTION;  Surgeon: Isaias Cowman, MD;  Location: [facility];  Service: Cardiovascular;  Laterality: N/A;   NASAL SINUS SURGERY  most recent 7-8 yrs ago   7 sinus surgeries    TRIGGER FINGER RELEASE  11/19/2011   Procedure: RELEASE  TRIGGER FINGER/A-1 PULLEY;  Surgeon: Wynonia Sours, MD;  Location: [facility];  Service: Orthopedics;  Laterality: Right;  release a-1 pulley right index finger and cyst removal   WRIST GANGLION EXCISION  1980's   right   HPI:  Per H&P "[name] is a 81 y.o. female with medical history significant of recent stroke with left-sided weakness, hypertension, hyperlipidemia, asthma, GERD, hypothyroidism, depression with anxiety, seizure, pancreatitis, anemia, dCHF, who presents with worsening left-sided weakness.     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 Per husband, pt got up around 1 AM this morning and went to use the restroom, started feeling worsening left-sided weakness, fainted for few minutes.  Not completely lost consciousness.  Patient also had slurred speech.  No vision loss or hearing loss. Patient does not have chest pain, fever or chills.  Patient has multiple episodes of nonbilious nonbloody vomiting.  No diarrhea.  Husband states that the patient has intermittent abdominal pain in the past few weeks, which is located in the central abdomen, mild to moderate, sharp, nonradiating.  After vomiting, patient started coughing with little mucus production.  No symptoms of UTI.  Denies dark stool or rectal bleeding.     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  Tele neurology was consulted, recommended IV heparin with no bolus."   Assessment / Plan / Recommendation Clinical Impression  Pt seen for cognitive-linguistic evaluation. Pt alert. Slow to respond. Some wordfinding difficulty noted during informal conversational exchanges. Cleared with RN who commented that pt is aphasic. Husband present.   Per chart review, pt with hx of cognitive-communication deficits; however, pt d/c'd from outpatient SLP services in November 2023 as pt met all of her goals.   Assessment completed via informal means and portions of [place]  Aphasia Battery Revised - Bedside Record Form. Full cognitive assessment deferred due to aphasia. Pt presents with s/sx mild non-fluent aphasia with expressive language deficits which was most apparent during conversational speech and receptive language deficits affecting complex auditory comprehension (e.g. complex 2-step commands; complex yes/no questions). Pt with inconsistent ability to repair communication breakdowns during anomic events.   Based on today's evaluation, anticipate need for SLP f/u at d/c as well as constant/frequent supervision. SLP to follow while pt in house.   Pt, pt's husband, and RN made aware of results, recommendations, and SLP POC. Pt and husband verbalized understanding/agreement.     SLP Assessment  SLP Recommendation/Assessment: Patient needs continued Speech [place] [facility] SLP Visit Diagnosis: Cognitive communication deficit (R41.841)    Recommendations for follow up therapy are one component of a multi-disciplinary discharge planning process, led by the attending physician.  Recommendations may be updated based on patient status, additional functional criteria and insurance authorization.    Follow Up Recommendations   (TBD; will likely benefit from SLP f/u at d/c)  Assistance Recommended at Discharge  Frequent or constant Supervision/Assistance  Functional Status Assessment Patient has had a recent decline in their functional status and demonstrates the ability to make significant improvements in function in a reasonable and predictable amount of time.  Frequency and Duration min 2x/week  2 weeks      SLP Evaluation Cognition  Orientation Level: Oriented to person;Oriented to place       Comprehension  Auditory Comprehension Overall Auditory Comprehension: Impaired Yes/No Questions: Impaired Complex Questions: 75-100% accurate Commands: Impaired Complex Commands: 50-74% accurate    Expression Expression Primary Mode of Expression:  Verbal Verbal Expression Overall Verbal Expression: Impaired Initiation: No impairment Automatic Speech:  (WFL) Level of Generative/Spontaneous Verbalization: Phrase;Sentence (mild anomia noted) Repetition: No impairment Naming: No impairment Pragmatics:  (flat affect)   Oral / Motor  Oral Motor/Sensory Function Overall Oral Motor/Sensory Function: Mild impairment Facial ROM: Reduced left Facial Symmetry: Abnormal symmetry left Lingual Symmetry: Within Functional Limits Lingual Strength: Within Functional Limits Velum: Within Functional Limits Mandible: Within Functional Limits           Cherrie Gauze, M.S., [facility] [phone] [place])  Quintella Baton 11/24/2022, 12:19 PM

## 2022-11-24 NOTE — Progress Notes (Signed)
Inpatient Rehab Admissions Coordinator:  ° °Per therapy recommendation,  patient was screened for CIR candidacy by Ltanya Bayley, MS, CCC-SLP. At this time, Pt. Appears to be a a potential candidate for CIR. I will place   order for rehab consult per protocol for full assessment. Please contact me any with questions. ° °Jacy Brocker, MS, CCC-SLP °Rehab Admissions Coordinator  °[phone] (celll) °[phone] (office) ° °

## 2022-11-24 NOTE — Hospital Course (Signed)
[name] is a 81 y.o. female with medical history significant of recent stroke with left-sided weakness, hypertension, hyperlipidemia, asthma, GERD, hypothyroidism, depression with anxiety, seizure, pancreatitis, anemia, dCHF, who presents with worsening left-sided weakness.  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 Per husband, pt got up around 1 AM this morning and went to use the restroom, started feeling worsening left-sided weakness, fainted for few minutes.  Not completely lost consciousness.  Patient also had slurred speech. Also multiple episodes of nonbilious nonbloody vomiting.  12/17:  WBC 11.6, lactic acid 11.9, INR 1.0, PTT 26, troponin level 7, 10, UDS negative, negative urinalysis, alcohol level less than 10, AKI with creatinine 1.79, BUN 22, GFR 29 (recent baseline creatinine 0.9 09/05/2022), temperature normal, blood pressure 145/75, heart rate 102, RR 25, oxygen saturation 99% on room air. 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  Tele neurology consulted, initially recommended IV heparin with no bolus, repeat Echo, later held heparin. Continued vomiting turning to coffee-grounds type material.  CT abd/pelvis noted 8.4 cm mass-like inflammatory/edematous changes in the anterior pelvic fat cephalad to the urinary bladder, possibly contusion if history of regional trauma; neoplasm eg liposarcoma would be a less common etiology. GI consulted, recommend PPI, unlikely GI bleed, cosinder CTA if worsening but no plan for endoscopy at this time. General surgery consulted - opinion that less likely mass more likely contusion, consider MRI pelvis. 12/18: still nauseous. Neurology cleared to restart ASA/Plavix. Hypokalemia this morning to 2.7. Improved to 3.2 12/19: neurology cleared for d/c, cardiology interrogated loop recorder, no events, will continue to record and follow outpatient.   Consultants:  Neurology GI General Surgery    Procedures: none      ASSESSMENT & PLAN:   Principal Problem:   Stroke [name]) Active Problems:   Occlusion of right vertebral artery   Aspiration pneumonia (H[facility])   Abdominal pain   Coffee ground emesis   Essential hypertension   Acquired hypothyroidism   HLD (hyperlipidemia)   Asthma   Chronic diastolic CHF (congestive heart failure) ([facility])   Normocytic anemia   AKI (acute kidney injury) ([name])   Depression with anxiety   Mesenteric mass  Stroke [name]) Occlusion of right vertebral artery Neurology following SLP eval mild aspiration risk d/t N/V Restart ASA & Plavix Echo pending TTE, consider TEE per neurology  PT/OT recs for inpatient rehab  AKI (acute kidney injury) ([name]) Improving/resolved IV fluids can d/c when taking po better Monitor BMP  Hypokalemia Likely d/t emesis Replete as needed Monitor BMP - remains low, pharmacy consulted for repletion help  Aspiration pneumonia (HCC) Continue Unasyn Sputum culture Prn albuterol and mucinex   Abdominal pain Coffee ground emesis Cleared by GI, no procedures planned PPI Symptomatic care for nausea Monitor CBC and s/s bleeding  Mesenteric mass Cleared by general surgery, likely contusion Consider MRI if needed   Essential hypertension Home BP meds held for now w/ AKI, dehydraition  Acquired hypothyroidism Continue synthroid  HLD (hyperlipidemia) Continue lipitor   Asthma Bronchodilators  Chronic diastolic CHF (congestive heart failure) ([name]) 2D echo on 08/20/2022 showed EF of 55-8% with grade 1 diastolic dysfunction. Patient does not have leg edema JVD. CHF seem to be compensated.  Caution w/ fluids for above   Normocytic anemia Monitor CBC  Depression with anxiety Continue home meds    DVT prophylaxis: SCD Pertinent IV fluids/nutrition: dextrose NS given hypoglycemia  Central lines / invasive devices: none   Code Status: DNR  Disposition: inpatient  TOC needs: inpatient  rehab Barriers to discharge / significant pending items: hypokalemia, not eating needing IV fluids

## 2022-11-24 NOTE — Evaluation (Addendum)
Physical Therapy Evaluation Patient Details Name: [name] MRN: [id number] DOB: May 30, 1941 Today's Date: 11/24/2022  History of Present Illness  Pt is an 81 y.o. female presenting to hospital 12/17 with c/o weakness and syncopal episode (assisted to ground by her husband)--upon wakening pt felt weaker on L side; h/o stroke 3 months prior with L sided deficits.  Pt also c/o HA, nausea, and vomiting (coffee ground looking).  Imaging showing extensive acute nonhemorrhagic infarcts involving the posteroinferior cerebellum B (R>L), 9 mm R occipital pole infarct, punctate white matter infarct in L occipital lobe, and punctate cortical infarct in the medial R occipital lobe more superiorly.  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  PMH includes anemia, asthma, back pain, htn, knee pain, PNA, pancreatitis, PNA, seizures, SOB, stroke, thyroid disease, CTR, foot osteotomy, B hand sx, R TKA.  Clinical Impression  Prior to hospital admission, pt was ambulatory; lives with her husband on main level of home with steps to enter.  Pt initially sleeping upon PT entering room but woken with vc's and stayed alert rest of session.  No c/o pain during session.  Currently pt is mod assist semi-supine to sitting edge of bed and min assist stand step turn bed to recliner; limited mobility d/t pt with multiple emesis shortly after getting into recliner (therapist gave pt a cold washcloth to forehead and then repositioned pt in recliner for comfort once pt stopped vomiting).  Nurse notified regarding pt's status; pt appearing comfortable resting in recliner end of session.  Pt would benefit from skilled PT to address noted impairments and functional limitations (see below for any additional details).  Upon hospital discharge, pt may benefit from inpatient rehab.    Recommendations for follow up therapy are one component of a multi-disciplinary discharge planning process, led by the attending physician.   Recommendations may be updated based on patient status, additional functional criteria and insurance authorization.  Follow Up Recommendations Acute inpatient rehab (3hours/day) (pending pt progress)      Assistance Recommended at Discharge Frequent or constant Supervision/Assistance  Patient can return home with the following  A little help with walking and/or transfers;A little help with bathing/dressing/bathroom;Assistance with cooking/housework;Assist for transportation;Help with stairs or ramp for entrance    Equipment Recommendations Rolling walker (2 wheels);BSC/3in1;Wheelchair (measurements PT);Wheelchair cushion (measurements PT)  Recommendations for Other Services  OT consult    Functional Status Assessment Patient has had a recent decline in their functional status and demonstrates the ability to make significant improvements in function in a reasonable and predictable amount of time.     Precautions / Restrictions Precautions Precautions: Fall Precaution Comments: Aspiration Restrictions Weight Bearing Restrictions: No      Mobility  Bed Mobility Overal bed mobility: Needs Assistance Bed Mobility: Supine to Sit     Supine to sit: Mod assist, HOB elevated     General bed mobility comments: assist for trunk and to scoot forward on edge of bed    Transfers Overall transfer level: Needs assistance Equipment used: 1 person hand held assist Transfers: Sit to/from Stand, Bed to chair/wheelchair/BSC Sit to Stand: Min assist Stand pivot transfers: Min assist         General transfer comment: min assist to stand and take steps bed to recliner    Ambulation/Gait               General Gait Details: deferred d/t multiple emesis shortly after getting to recliner  Stairs  Wheelchair Mobility    Modified Rankin (Stroke Patients Only)       Balance Overall balance assessment: Needs assistance (steady static sitting; requires assist for  static standing)                                           Pertinent Vitals/Pain Pain Assessment Pain Assessment: No/denies pain    Home Living Family/patient expects to be discharged to:: Private residence Living Arrangements: Spouse/significant other Available Help at Discharge: Family;Available 24 hours/day Type of Home: House Home Access: Stairs to enter Entrance Stairs-Rails: Right;Left Entrance Stairs-Number of Steps: 2 steps then small threshold into house   Home Layout: Two level;Able to live on main level with bedroom/bathroom Home Equipment: Toilet riser;BSC/3in1;Rolling Walker (2 wheels);Shower seat;Other (comment) (3ww)      Prior Function Prior Level of Function : Independent/Modified Independent             Mobility Comments: Per pt she was using occasional 3ww or 4ww or no AD use (per OT eval spouse reporting use of RW). ADLs Comments: Per OT eval "Ind prior to CVA in september. She has most recently needed min A for self care tasks since returning home."     Hand Dominance   Dominant Hand: Right    Extremity/Trunk Assessment   Upper Extremity Assessment Upper Extremity Assessment: Defer to OT evaluation LUE Deficits / Details: per OT eval "decreased shoulder elevation and increased tone noted from prior CVA but pt reports more weakness than before."    Lower Extremity Assessment Lower Extremity Assessment: Generalized weakness (able to perform B LE SLR independently; at least 3/5 AROM hip flexion, knee flexion/extension, and DF/PF; intacte B LE light touch, tone, and proprioception)    Cervical / Trunk Assessment Cervical / Trunk Assessment: Normal  Communication   Communication: No difficulties  Cognition Arousal/Alertness: Awake/alert, Lethargic (Initially drowsy but woken with vc's) Behavior During Therapy: Flat affect Overall Cognitive Status: Impaired/Different from baseline                                  General Comments: Increased time to process during session; vc's for technique        General Comments  Nursing cleared pt for participation in physical therapy.  Pt agreeable to PT session.    Exercises  Transfer training   Assessment/Plan    PT Assessment Patient needs continued PT services  PT Problem List Decreased strength;Decreased activity tolerance;Decreased balance;Decreased mobility;Decreased knowledge of use of DME;Decreased knowledge of precautions       PT Treatment Interventions DME instruction;Gait training;Stair training;Functional mobility training;Therapeutic activities;Therapeutic exercise;Balance training;Patient/family education    PT Goals (Current goals can be found in the Care Plan section)  Acute Rehab PT Goals Patient Stated Goal: to improve mobility PT Goal Formulation: With patient Time For Goal Achievement: 12/08/22 Potential to Achieve Goals: Good    Frequency 7X/week     Co-evaluation               AM-PAC PT "6 Clicks" Mobility  Outcome Measure Help needed turning from your back to your side while in a flat bed without using bedrails?: None Help needed moving from lying on your back to sitting on the side of a flat bed without using bedrails?: A Lot Help needed moving to and from a bed  to a chair (including a wheelchair)?: A Little Help needed standing up from a chair using your arms (e.g., wheelchair or bedside chair)?: A Little Help needed to walk in hospital room?: A Lot Help needed climbing 3-5 steps with a railing? : Total 6 Click Score: 15    End of Session Equipment Utilized During Treatment: Gait belt Activity Tolerance: Other (comment) (limited d/t multiple emesis (nurse notified)) Patient left: in chair;with call bell/phone within reach;with chair alarm set Nurse Communication: Mobility status;Precautions;Other (comment) (pt's multiple emesis) PT Visit Diagnosis: Other abnormalities of gait and mobility (R26.89);Muscle  weakness (generalized) (M62.81);Other symptoms and signs involving the nervous system (R29.898);Unsteadiness on feet (R26.81)    Time: 2831-5176 PT Time Calculation (min) (ACUTE ONLY): 48 min   Charges:   PT Evaluation $PT Eval Low Complexity: 1 Low PT Treatments $Therapeutic Activity: 8-22 mins       Leitha Bleak, PT 11/24/22, 2:10 PM

## 2022-11-24 NOTE — Progress Notes (Addendum)
PROGRESS NOTE    [name]   ZGY:[id number] DOB: 08-12-1941  DOA: 11/23/2022 Date of Service: 11/24/22 PCP: Idelle Crouch, MD     Brief Narrative / Hospital Course:  [name] is a 81 y.o. female with medical history significant of recent stroke with left-sided weakness, hypertension, hyperlipidemia, asthma, GERD, hypothyroidism, depression with anxiety, seizure, pancreatitis, anemia, dCHF, who presents with worsening left-sided weakness.  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 Per husband, pt got up around 1 AM this morning and went to use the restroom, started feeling worsening left-sided weakness, fainted for few minutes.  Not completely lost consciousness.  Patient also had slurred speech. Also multiple episodes of nonbilious nonbloody vomiting.  12/17:  WBC 11.6, lactic acid 11.9, INR 1.0, PTT 26, troponin level 7, 10, UDS negative, negative urinalysis, alcohol level less than 10, AKI with creatinine 1.79, BUN 22, GFR 29 (recent baseline creatinine 0.9 09/05/2022), temperature normal, blood pressure 145/75, heart rate 102, RR 25, oxygen saturation 99% on room air. 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  Tele neurology consulted, initially recommended IV heparin with no bolus, repeat Echo, later held heparin. Continued vomiting turning to coffee-grounds type material.  CT abd/pelvis noted 8.4 cm mass-like inflammatory/edematous changes in the anterior pelvic fat cephalad to the urinary bladder, possibly contusion if history of regional trauma; neoplasm eg liposarcoma would be a less common etiology. GI consulted, recommend PPI, unlikely GI bleed, cosinder CTA if worsening but no plan for endoscopy at this time. General surgery consulted - opinion that less likely mass more likely contusion, consider MRI pelvis. 12/18: still nauseous. Neurology cleared to restart ASA/Plavix. Hypokalemia this morning to 2.7.   Consultants:   Neurology GI General Surgery   Procedures: none      ASSESSMENT & PLAN:   Principal Problem:   Stroke [facility]) Active Problems:   Occlusion of right vertebral artery   Aspiration pneumonia (HCC)   Abdominal pain   Coffee ground emesis   Essential hypertension   Acquired hypothyroidism   HLD (hyperlipidemia)   Asthma   Chronic diastolic CHF (congestive heart failure) (HCC)   Normocytic anemia   AKI (acute kidney injury) ([place])   Depression with anxiety   Mesenteric mass  Stroke [facility]) Occlusion of right vertebral artery Neurology following SLP eval mild aspiration risk d/t N/V Restart ASA & Plavix Echo pending TTE, consider TEE per neurology  PT/OT recs for inpatient rehab  AKI (acute kidney injury) ([place]) Improving IV fluids can d/c when taking po better Monitor BMP  Hypokalemia Likely d/t emesis Replete as needed Monitor BMP  Aspiration pneumonia (HCC) Continue Unasyn Sputum culture Prn albuterol and mucinex   Abdominal pain Coffee ground emesis Cleared by GI, no procedures planned PPI Symptomatic care for nausea Monitor CBC and s/s bleeding  Mesenteric mass Cleared by general surgery, likely contusion Consider MRI if needed   Essential hypertension Home BP meds held for now w/ AKI, dehydraition  Acquired hypothyroidism Continue synthroid  HLD (hyperlipidemia) Continue lipitor   Asthma Bronchodilators  Chronic diastolic CHF (congestive heart failure) ([place]) 2D echo on 08/20/2022 showed EF of 55-8% with grade 1 diastolic dysfunction. Patient does not have leg edema JVD. CHF seem to be compensated.  Caution w/ fluids for above   Normocytic anemia Monitor CBC  Depression with anxiety Continue home meds    DVT prophylaxis: SCD Pertinent IV fluids/nutrition: dextrose NS given hypoglycemia  Central lines / invasive devices:  none   Code Status: DNR  Disposition: inpatient  TOC needs: inpatient rehab Barriers to discharge / significant  pending items: hypokalemia, not eating needing IV fluids              Subjective:  Patient reports persistent nausea mild headache Denies CP/SOB.  Denies new weakness.  Not tolerating diet but cleared as above per SLP  Reports no concerns w/ urination/defecation.   Family Communication: saw husband at bedside later afternoon     Objective Findings:  Vitals:   11/24/22 0040 11/24/22 0045 11/24/22 0323 11/24/22 1149  BP: (!) 119/48  (!) 122/45 135/69  Pulse: 91  91 98  Resp: '18  18 17  '$ Temp: 98.1 F (36.7 C)  98.2 F (36.8 C) 98.5 F (36.9 C)  TempSrc:   Oral Oral  SpO2: (!) 70% 94% 96% 100%  Weight:      Height:        Intake/Output Summary (Last 24 hours) at 11/24/2022 1720 Last data filed at 11/24/2022 1535 Gross per 24 hour  Intake 1064.35 ml  Output --  Net 1064.35 ml   Filed Weights   11/23/22 0145  Weight: 48.4 kg    Examination: Constitutional:  VS as above General Appearance: alert, well-developed, well-nourished, NAD Respiratory: Normal respiratory effort No wheeze No rhonchi No rales Cardiovascular: S1/S2 normal No murmur No rub/gallop auscultated No lower extremity edema Gastrointestinal: No tenderness Neurological: No cranial nerve deficit on limited exam Alert Psychiatric: Normal judgment/insight Normal mood and affect       Scheduled Medications:   aspirin EC  81 mg Oral Daily   atorvastatin  40 mg Oral Daily   clopidogrel  75 mg Oral Daily   folic acid  1 mg Oral Daily   levothyroxine  50 mcg Oral QAC breakfast   montelukast  10 mg Oral QPM   multivitamin with minerals  1 tablet Oral Daily   pantoprazole  40 mg Intravenous Q12H    Continuous Infusions:  sodium chloride Stopped (11/24/22 1205)   ampicillin-sulbactam (UNASYN) IV Stopped (11/24/22 0537)   dextrose 5 % and 0.9% NaCl 100 mL/hr at 11/24/22 1208   promethazine (PHENERGAN) injection (IM or IVPB)      PRN Medications:  acetaminophen **OR**  acetaminophen (TYLENOL) oral liquid 160 mg/5 mL **OR** acetaminophen, albuterol, dextromethorphan-guaiFENesin, hydrALAZINE, morphine injection, ondansetron (ZOFRAN) IV, promethazine (PHENERGAN) injection (IM or IVPB), senna-docusate  Antimicrobials:  Anti-infectives (From admission, onward)    Start     Dose/Rate Route Frequency Ordered Stop   11/23/22 1800  Ampicillin-Sulbactam (UNASYN) 3 g in sodium chloride 0.9 % 100 mL IVPB        3 g 200 mL/hr over 30 Minutes Intravenous Every 12 hours 11/23/22 0840     11/23/22 0415  Ampicillin-Sulbactam (UNASYN) 3 g in sodium chloride 0.9 % 100 mL IVPB        3 g 200 mL/hr over 30 Minutes Intravenous  Once 11/23/22 0408 11/23/22 0542           Data Reviewed: I have personally reviewed following labs and imaging studies  CBC: Recent Labs  Lab 11/23/22 0204 11/23/22 1902 11/23/22 2142 11/24/22 0241 11/24/22 1013  WBC 11.6* 13.5* 13.0* 11.7* 13.3*  NEUTROABS 9.1*  --   --   --   --   HGB 10.5* 9.8* 9.2* 8.6* 9.2*  HCT 34.0* 31.1* 29.2* 27.2* 29.0*  MCV 92.9 90.1 90.4 90.4 90.9  PLT 546* 541* 501* 462* 756*   Basic Metabolic Panel:  Recent Labs  Lab 11/23/22 0204 11/24/22 0241  NA 142 144  K 3.7 2.7*  CL 109 111  CO2 21* 24  GLUCOSE 179* 76  BUN 22 13  CREATININE 1.79* 1.36*  CALCIUM 9.6 8.8*   GFR: Estimated Creatinine Clearance: 22.5 mL/min (A) (by C-G formula based on SCr of 1.36 mg/dL (H)). Liver Function Tests: Recent Labs  Lab 11/23/22 0204  AST 34  ALT 24  ALKPHOS 64  BILITOT 0.9  PROT 6.9  ALBUMIN 3.8   Recent Labs  Lab 11/23/22 0412  LIPASE 32   No results for input(s): "AMMONIA" in the last 168 hours. Coagulation Profile: Recent Labs  Lab 11/23/22 0204  INR 1.0   Cardiac Enzymes: No results for input(s): "CKTOTAL", "CKMB", "CKMBINDEX", "TROPONINI" in the last 168 hours. BNP (last 3 results) No results for input(s): "PROBNP" in the last 8760 hours. HbA1C: Recent Labs    11/23/22 0841   HGBA1C 6.1*   CBG: Recent Labs  Lab 11/23/22 2148 11/24/22 0756 11/24/22 0910 11/24/22 1009 11/24/22 1135  GLUCAP 119* 67* 66* 158* 80   Lipid Profile: Recent Labs    11/24/22 0241  CHOL 138  HDL 51  LDLCALC 48  TRIG 193*  CHOLHDL 2.7   Thyroid Function Tests: No results for input(s): "TSH", "T4TOTAL", "FREET4", "T3FREE", "THYROIDAB" in the last 72 hours. Anemia Panel: No results for input(s): "VITAMINB12", "FOLATE", "FERRITIN", "TIBC", "IRON", "RETICCTPCT" in the last 72 hours. Most Recent Urinalysis On File:     Component Value Date/Time   COLORURINE STRAW (A) 11/23/2022 0204   APPEARANCEUR CLEAR (A) 11/23/2022 0204   LABSPEC 1.026 11/23/2022 0204   PHURINE 6.0 11/23/2022 0204   GLUCOSEU 50 (A) 11/23/2022 0204   HGBUR MODERATE (A) 11/23/2022 0204   BILIRUBINUR NEGATIVE 11/23/2022 0204   KETONESUR NEGATIVE 11/23/2022 0204   PROTEINUR NEGATIVE 11/23/2022 0204   UROBILINOGEN 0.2 04/11/2011 1030   NITRITE NEGATIVE 11/23/2022 0204   LEUKOCYTESUR NEGATIVE 11/23/2022 0204   Sepsis Labs: '@LABRCNTIP'$ (procalcitonin:4,lacticidven:4)  Recent Results (from the past 240 hour(s))  Culture, blood (routine x 2)     Status: None (Preliminary result)   Collection Time: 11/23/22  4:12 AM   Specimen: BLOOD RIGHT ARM  Result Value Ref Range Status   Specimen Description BLOOD RIGHT ARM  Final   Special Requests   Final    BOTTLES DRAWN AEROBIC AND ANAEROBIC Blood Culture adequate volume   Culture   Final    NO GROWTH 1 DAY Performed at [facility], [address] [place], [place] [address]    Report Status PENDING  Incomplete  Culture, blood (routine x 2)     Status: None (Preliminary result)   Collection Time: 11/23/22  4:12 AM   Specimen: BLOOD RIGHT ARM  Result Value Ref Range Status   Specimen Description BLOOD RIGHT ARM  Final   Special Requests   Final    BOTTLES DRAWN AEROBIC AND ANAEROBIC Blood Culture adequate volume   Culture   Final    NO GROWTH 1  DAY Performed at [facility], [address], [place], [place] [address]    Report Status PENDING  Incomplete         Radiology Studies: ECHOCARDIOGRAM COMPLETE  Result Date: 11/24/2022    ECHOCARDIOGRAM REPORT   Patient Name:   ELZIE SHEETS Date of Exam: 11/24/2022 Medical Rec #:  [id number]  Height:       59.0 in Accession #:    [id number] Weight:  106.7 lb Date of Birth:  [birth year] BSA:          1.412 m Patient Age:    38 years   BP:           122/45 mmHg Patient Gender: F          HR:           87 bpm. Exam Location:  [facility] Procedure: 2D Echo, Color Doppler and Cardiac Doppler Indications:     I63.9 Stroke  History:         Patient has prior history of Echocardiogram examinations, most                  recent 08/20/2022. Risk Factors:Hypertension and Dyslipidemia.  Sonographer:     Charmayne Sheer Referring Phys:  [id number] Lorenza Chick Diagnosing Phys: Kathlyn Sacramento MD  Sonographer Comments: No subcostal window. IMPRESSIONS  1. Left ventricular ejection fraction, by estimation, is 55 to 60%. The left ventricle has normal function. The left ventricle has no regional wall motion abnormalities. Left ventricular diastolic parameters are consistent with Grade I diastolic dysfunction (impaired relaxation).  2. Right ventricular systolic function is normal. The right ventricular size is normal. Tricuspid regurgitation signal is inadequate for assessing PA pressure.  3. The mitral valve is normal in structure. No evidence of mitral valve regurgitation. No evidence of mitral stenosis.  4. The aortic valve is normal in structure. Aortic valve regurgitation is not visualized. Aortic valve sclerosis is present, with no evidence of aortic valve stenosis.  5. The inferior vena cava is normal in size with greater than 50% respiratory variability, suggesting right atrial pressure of 3 mmHg. FINDINGS  Left Ventricle: Left ventricular ejection fraction, by estimation, is 55 to 60%. The left ventricle has  normal function. The left ventricle has no regional wall motion abnormalities. The left ventricular internal cavity size was normal in size. There is  no left ventricular hypertrophy. Left ventricular diastolic parameters are consistent with Grade I diastolic dysfunction (impaired relaxation). Right Ventricle: The right ventricular size is normal. No increase in right ventricular wall thickness. Right ventricular systolic function is normal. Tricuspid regurgitation signal is inadequate for assessing PA pressure. Left Atrium: Left atrial size was normal in size. Right Atrium: Right atrial size was normal in size. Pericardium: There is no evidence of pericardial effusion. Mitral Valve: The mitral valve is normal in structure. No evidence of mitral valve regurgitation. No evidence of mitral valve stenosis. Tricuspid Valve: The tricuspid valve is normal in structure. Tricuspid valve regurgitation is not demonstrated. No evidence of tricuspid stenosis. Aortic Valve: The aortic valve is normal in structure. Aortic valve regurgitation is not visualized. Aortic valve sclerosis is present, with no evidence of aortic valve stenosis. Aortic valve mean gradient measures 6.0 mmHg. Aortic valve peak gradient measures 11.4 mmHg. Aortic valve area, by VTI measures 1.71 cm. Pulmonic Valve: The pulmonic valve was normal in structure. Pulmonic valve regurgitation is trivial. No evidence of pulmonic stenosis. Aorta: The aortic root is normal in size and structure. Venous: The inferior vena cava is normal in size with greater than 50% respiratory variability, suggesting right atrial pressure of 3 mmHg. IAS/Shunts: No atrial level shunt detected by color flow Doppler.  LEFT VENTRICLE PLAX 2D LVIDd:         3.60 cm   Diastology LVIDs:         2.50 cm   LV e' medial:    8.05 cm/s LV PW:  1.20 cm   LV E/e' medial:  13.5 LV IVS:        0.90 cm   LV e' lateral:   11.70 cm/s LVOT diam:     1.80 cm   LV E/e' lateral: 9.3 LV SV:          57 LV SV Index:   40 LVOT Area:     2.54 cm  RIGHT VENTRICLE RV Basal diam:  2.70 cm RV Mid diam:    3.60 cm RV S prime:     25.00 cm/s TAPSE (M-mode): 3.9 cm LEFT ATRIUM             Index        RIGHT ATRIUM           Index LA diam:        3.70 cm 2.62 cm/m   RA Area:     11.70 cm LA Vol (A2C):   23.4 ml 16.57 ml/m  RA Volume:   25.30 ml  17.92 ml/m LA Vol (A4C):   39.8 ml 28.18 ml/m LA Biplane Vol: 31.9 ml 22.59 ml/m  AORTIC VALVE                     PULMONIC VALVE AV Area (Vmax):    1.75 cm      PV Vmax:       1.27 m/s AV Area (Vmean):   1.68 cm      PV Vmean:      88.900 cm/s AV Area (VTI):     1.71 cm      PV VTI:        0.286 m AV Vmax:           169.00 cm/s   PV Peak grad:  6.5 mmHg AV Vmean:          115.000 cm/s  PV Mean grad:  4.0 mmHg AV VTI:            0.333 m AV Peak Grad:      11.4 mmHg AV Mean Grad:      6.0 mmHg LVOT Vmax:         116.00 cm/s LVOT Vmean:        75.700 cm/s LVOT VTI:          0.224 m LVOT/AV VTI ratio: 0.67  AORTA Ao Root diam: 2.70 cm MITRAL VALVE MV Area (PHT): 4.49 cm     SHUNTS MV Decel Time: 169 msec     Systemic VTI:  0.22 m MV E velocity: 109.00 cm/s  Systemic Diam: 1.80 cm MV A velocity: 130.00 cm/s MV E/A ratio:  0.84 Kathlyn Sacramento MD Electronically signed by Kathlyn Sacramento MD Signature Date/Time: 11/24/2022/1:17:18 PM    Final             LOS: 1 day    Time spent: 50 minutes     Emeterio Reeve, DO [facility] 11/24/2022, 5:20 PM    Dictation software may have been used to generate the above note. Typos may occur and escape review in typed/dictated notes. Please contact Dr Sheppard Coil directly for clarity if needed.  Staff may message me via secure chat in [place]  but this may not receive an immediate response,  please page me for urgent matters!  If 7PM-7AM, please contact night coverage [email or web address]

## 2022-11-24 NOTE — Progress Notes (Signed)
*  PRELIMINARY RESULTS* Echocardiogram 2D Echocardiogram has been performed.  [name] 11/24/2022, 9:46 AM

## 2022-11-24 NOTE — Evaluation (Signed)
Occupational Therapy Evaluation Patient Details Name: [name] MRN: [id number] DOB: 22-Oct-1941 Today's Date: 11/24/2022   History of Present Illness Pt is an 81 y.o. female presenting to hospital 12/17 with c/o weakness and syncopal episode (assisted to ground by her husband)--upon wakening pt felt weaker on L side; h/o stroke 3 months prior with L sided deficits.  Pt also c/o HA, nausea, and vomiting (coffee ground looking).  Imaging showing extensive acute nonhemorrhagic infarcts involving the posteroinferior cerebellum B (R>L), 9 mm R occipital pole infarct, punctate white matter infarct in L occipital lobe, and punctate cortical infarct in the medial R occipital lobe more superiorly.  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  PMH includes anemia, asthma, back pain, htn, knee pain, PNA, pancreatitis, PNA, seizures, SOB, stroke, thyroid disease, CTR, foot osteotomy, B hand sx, R TKA.   Clinical Impression   Patient presenting with decreased Ind in self care,balance, functional mobility/transfers, endurance, cognition,and safety awareness. Patient's husband reports recent inpt rehab stay after CVA in September of this year. Pt was independent prior to CVA and since being home has been needing supervision - min guard for functional mobility and self care from husband. Initially pt is somewhat lethargic but alertness increases once seated on EOB. Static sitting balance with close supervision on EOB. Pt does report nausea but no active vomiting this session. Pt stands with min - mod A and takes several side steps to the R before returning to sit on EOB. Pt notes to have wet linens and gown . RN notified. Pt returning to bed secondary to fatigue and just generally feeling unwell. Patient will benefit from acute OT to increase overall independence in the areas of ADLs, functional mobility, and safety awareness in order to safely discharge to next venue of care.        Recommendations for follow up therapy are one component of a multi-disciplinary discharge planning process, led by the attending physician.  Recommendations may be updated based on patient status, additional functional criteria and insurance authorization.   Follow Up Recommendations  Acute inpatient rehab (3hours/day)     Assistance Recommended at Discharge Frequent or constant Supervision/Assistance  Patient can return home with the following A lot of help with walking and/or transfers;A lot of help with bathing/dressing/bathroom;Help with stairs or ramp for entrance;Assist for transportation;Direct supervision/assist for financial management;Direct supervision/assist for medications management    Functional Status Assessment  Patient has had a recent decline in their functional status and demonstrates the ability to make significant improvements in function in a reasonable and predictable amount of time.  Equipment Recommendations  None recommended by OT       Precautions / Restrictions Precautions Precautions: Fall Precaution Comments: Aspiration Restrictions Weight Bearing Restrictions: No      Mobility Bed Mobility Overal bed mobility: Needs Assistance Bed Mobility: Supine to Sit, Sit to Supine     Supine to sit: Min assist Sit to supine: Min assist   General bed mobility comments: truck support for safety with bed mobility    Transfers Overall transfer level: Needs assistance Equipment used: 1 person hand held assist Transfers: Sit to/from Stand Sit to Stand: Min assist           General transfer comment: mod A for balance for side steps to the R along EOB      Balance Overall balance assessment: Needs assistance Sitting-balance support: Feet supported Sitting balance-Leahy Scale: Good     Standing balance support: During functional  activity, Bilateral upper extremity supported Standing balance-Leahy Scale: Poor                              ADL either performed or assessed with clinical judgement   ADL Overall ADL's : Needs assistance/impaired                                             Vision Patient Visual Report: No change from baseline              Pertinent Vitals/Pain Pain Assessment Pain Assessment: No/denies pain     Hand Dominance Right   Extremity/Trunk Assessment Upper Extremity Assessment Upper Extremity Assessment: Defer to OT evaluation LUE Deficits / Details: per OT eval "decreased shoulder elevation and increased tone noted from prior CVA but pt reports more weakness than before."   Lower Extremity Assessment Lower Extremity Assessment: Generalized weakness (able to perform B LE SLR independently; at least 3/5 AROM hip flexion, knee flexion/extension, and DF/PF; intacte B LE light touch, tone, and proprioception)   Cervical / Trunk Assessment Cervical / Trunk Assessment: Normal   Communication Communication Communication: No difficulties   Cognition Arousal/Alertness: Awake/alert, Lethargic Behavior During Therapy: Flat affect Overall Cognitive Status: Impaired/Different from baseline                                 General Comments: Pt does appear lethargic but alerting more once seated on EOB. Increased time to process during session with cuing for technique and safety awareness.                Home Living Family/patient expects to be discharged to:: Private residence Living Arrangements: Spouse/significant other Available Help at Discharge: Family;Available 24 hours/day Type of Home: House Home Access: Stairs to enter [employer] of Steps: 2 steps then small threshold into house Entrance Stairs-Rails: Right;Left Home Layout: Two level;Able to live on main level with bedroom/bathroom     Bathroom Shower/Tub: Hospital doctor Toilet: Handicapped height     Home Equipment: Clinical biochemist (2  wheels);Shower seat;Other (comment) (3ww)      Lives With: Spouse    Prior Functioning/Environment Prior Level of Function : Independent/Modified Independent             Mobility Comments: Per pt she was using occasional 3ww or 4ww or no AD use (per OT eval spouse reporting use of RW). ADLs Comments: Per OT eval "Ind prior to CVA in september. She has most recently needed min A for self care tasks since returning home."        OT Problem List: Decreased strength;Decreased coordination;Decreased cognition;Decreased activity tolerance;Decreased safety awareness;Impaired balance (sitting and/or standing);Impaired UE functional use      OT Treatment/Interventions: Self-care/ADL training;Therapeutic exercise;Therapeutic activities;Energy conservation;Manual therapy;Balance training;Patient/family education;Cognitive remediation/compensation    OT Goals(Current goals can be found in the care plan section) Acute Rehab OT Goals Patient Stated Goal: to get stronger OT Goal Formulation: With patient/family Time For Goal Achievement: 12/08/22 Potential to Achieve Goals: Good ADL Goals Pt Will Perform Grooming: with supervision;standing Pt Will Perform Lower Body Dressing: with supervision;sit to/from stand Pt Will Transfer to Toilet: with supervision;ambulating Pt Will Perform Toileting - Clothing Manipulation and hygiene: with supervision;sit to/from stand  OT Frequency:  Min 3X/week       AM-PAC OT "6 Clicks" Daily Activity     Outcome Measure Help from another person eating meals?: A Little Help from another person taking care of personal grooming?: A Little Help from another person toileting, which includes using toliet, bedpan, or urinal?: A Lot Help from another person bathing (including washing, rinsing, drying)?: A Lot Help from another person to put on and taking off regular upper body clothing?: A Little Help from another person to put on and taking off regular lower body  clothing?: A Lot 6 Click Score: 15   End of Session Equipment Utilized During Treatment: Rolling walker (2 wheels) Nurse Communication: Mobility status;Other (comment) (linen change needed)  Activity Tolerance: Patient limited by fatigue Patient left: in bed;with call bell/phone within reach;with family/visitor present  OT Visit Diagnosis: Unsteadiness on feet (R26.81);Repeated falls (R29.6);Muscle weakness (generalized) (M62.81)                Time: 4401-0272 OT Time Calculation (min): 19 min Charges:  OT General Charges $OT Visit: 1 Visit OT Evaluation $OT Eval Moderate Complexity: 1 Mod OT Treatments $Therapeutic Activity: 8-22 mins  Darleen Crocker, MS, OTR/L , CBIS ascom [phone]  11/24/22, 1:57 PM

## 2022-11-24 NOTE — Progress Notes (Signed)
GI Inpatient Follow-up Note  Patient Identification: [name] is a 81 y.o. 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 GI is consulted for possible coffee-ground emesis.   Subjective: Husband at bedside and helps with history. Did not have any further episodes of nausea/vomiting overnight. Currently denies any nausea/vomiting or abd pain this morning. No BM yesterday. Speech therapy at bedside as well.   Scheduled Inpatient Medications:    stroke: early stages of recovery book   Does not apply Once   atorvastatin  40 mg Oral Daily   dextrose  12.5 g Intravenous Once   folic acid  1 mg Oral Daily   levothyroxine  50 mcg Oral QAC breakfast   montelukast  10 mg Oral QPM   multivitamin with minerals  1 tablet Oral Daily   pantoprazole  40 mg Intravenous Q12H    Continuous Inpatient Infusions:    sodium chloride 75 mL/hr at 11/23/22 1810   ampicillin-sulbactam (UNASYN) IV 3 g (11/24/22 0502)   potassium chloride 10 mEq (11/24/22 0806)   promethazine (PHENERGAN) injection (IM or IVPB)      PRN Inpatient Medications:  acetaminophen **OR** acetaminophen (TYLENOL) oral liquid 160 mg/5 mL **OR** acetaminophen, albuterol, dextromethorphan-guaiFENesin, hydrALAZINE, morphine injection, ondansetron (ZOFRAN) IV, promethazine (PHENERGAN) injection (IM or IVPB), senna-docusate    Physical Examination: BP (!) 122/45 (BP Location: Right Arm)   Pulse 91   Temp 98.2 F (36.8 C) (Oral)   Resp 18   Ht '4\' 11"'$  (1.499 m)   Wt 48.4 kg   SpO2 96%   BMI 21.55 kg/m  Gen: NAD, alert and oriented, able to follow commands.  HEENT: PEERLA, EOMI, Neck: supple, no JVD or thyromegaly Chest: CTA bilaterally, no wheezes, crackles, or other adventitious sounds CV: RRR, no m/g/c/r Abd:  soft, NT, ND, +BS in all four quadrants; no HSM, guarding, ridigity, or rebound tenderness Ext: no edema, well perfused with 2+ pulses, Skin: no rash or lesions noted Lymph: no LAD  Data: Lab Results  Component Value Date   WBC 11.7 (H) 11/24/2022   HGB 8.6 (L) 11/24/2022   HCT 27.2 (L) 11/24/2022   MCV 90.4 11/24/2022   PLT 462 (H) 11/24/2022   Recent Labs  Lab 11/23/22 1902 11/23/22 2142 11/24/22 0241  HGB 9.8* 9.2* 8.6*   Lab Results  Component Value Date   NA 144 11/24/2022   K 2.7 (LL) 11/24/2022   CL 111 11/24/2022   CO2 24 11/24/2022   BUN 13 11/24/2022   CREATININE 1.36 (H) 11/24/2022   Lab Results  Component Value Date   ALT 24 11/23/2022   AST 34 11/23/2022   ALKPHOS 64 11/23/2022   BILITOT 0.9 11/23/2022   Recent Labs  Lab 11/23/22 0204  APTT 26  INR 1.0   Assessment/Plan: Ms. Rhyne is a 81 y.o. female admitted for acute CVA with noted dark emesis noted and GI consulted.   Anemia - Hgb was 12 in 08/2022 and was 9.8 on admission, at 8.6 this AM but may be dilutional drop as patient as no signs of GI bleeding overnight. Was started on PPI Iv BID yesterday as may have had esophagitis/gastritis from repeated dry heaving and retching & would recommend d/c on PPI if chronically on Plavix. Hold off on EGD as patient high risk for sedation given acute stroke and aspiration pneumonia Acute CVA -  neuro consulted Aspiration pna - on ABX AKI on CKD - improving Hypokalemia - replaced per primary team Abnormal CT scan - 8.4cm mass like inflammatory change anterior to urinary bladder. Surgery consulted.   Diet - patient seen by speech without concern for dysphagia, did have episode of emesis after my visit that was clear water and crackers. Can start clears and advance as tolerated per primary team as no endoscopic eval planned at this time.   Recommendations: IV PPI BID while admitted, d/c on PPI daily indefinitely if on antiplatelet/anticoag therapy monitor  H/h Transfusion and resuscitation as per primary team Avoid frequent lab draws to prevent lab induced anemia Supportive care and antiemetics as per primary team Maintain two sites IV access Avoid nsaids Monitor for GIB If pt should become hemodynamically unstable with signs of GIB would recommend CTA with possible embolization by IR/Vascular Surgery   Please call with questions or concerns. Case discussed w/ Dr. Virgina Jock. GI to follow peripherally, pleas call if needed.   Laurine Blazer, PA-C [place]

## 2022-11-24 NOTE — Progress Notes (Signed)
       CROSS COVER NOTE  NAME: [name] MRN: [id number] DOB : 11-30-1941 ATTENDING PHYSICIAN: Ivor Costa, MD    Date of Service   11/24/2022   HPI/Events of Note   Notified of Critical K--->2.7  Interventions   Assessment/Plan:  60 mEQ IV K      To reach the provider On-Call:   7AM- 7PM see care teams to locate the attending and reach out to them via [email or web address]. 7PM-7AM contact night-coverage If you still have difficulty reaching the appropriate provider, please page the [facility] (Director on Call) for [facility] on amion for assistance  This document was prepared using Set designer software and may include unintentional dictation errors.  Neomia Glass DNP, MBA, FNP-BC Nurse Practitioner [facility] Pager [phone]

## 2022-11-24 NOTE — Evaluation (Addendum)
PClinical/Bedside Swallow Evaluation Patient Details  Name: [name] MRN: [id number] Date of Birth: 1941/06/26  Today's Date: 11/24/2022 Time: SLP Start Time (ACUTE ONLY): 0840 SLP Stop Time (ACUTE ONLY): 9767 SLP Time Calculation (min) (ACUTE ONLY): 15 min  Past Medical History:  Past Medical History:  Diagnosis Date   Anemia    Arthritis    Asthma    uses inhaler just prior to surgery to avoid attack   Back pain    from previous injury   Complication of anesthesia    has woken  up during 2 different surgery   Depression    no current issue/treatment; situation   Gallstones    GERD (gastroesophageal reflux disease)    Hiatal hernia    patient does NOT have nerve/muscle disease   History of kidney stones    HLD (hyperlipidemia)    HTN (hypertension)    Hypothyroidism    Kidney stones    Knee pain    Non-diabetic pancreatic hormone dysfunction years   pt. 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   Pancreatitis    Pneumonia    Seizures (HCC)    caused by dye injected during a procedure   Shortness of breath    with exertion   Sinus problem    frequent infections/congestion   Stroke ([place]) 2021   reports having CVA in 2021 and having mini strokes before that   Thyroid disease    Past Surgical History:  Past Surgical History:  Procedure Laterality Date   ABDOMINAL HYSTERECTOMY     APPENDECTOMY     CARPAL TUNNEL RELEASE  10+ years ago   bilateral   EYE SURGERY  3 yrs ago   bilateral cataracts   FOOT OSTEOTOMY  6 weeks ago   Left foot: great, 2nd & 3rd   FOOT OSTEOTOMY  5 years ago   Right great toe   HAND SURGERY Bilateral 2011-most recent   multiple hand surgeries, 2 on left, 3 on right   KNEE ARTHROPLASTY Right 04/28/2022   Procedure: COMPUTER ASSISTED TOTAL KNEE ARTHROPLASTY;  Surgeon: Dereck Leep, MD;  Location: [facility];  Service: Orthopedics;  Laterality: Right;   LOOP RECORDER INSERTION N/A 05/16/2020   Procedure: LOOP RECORDER INSERTION;   Surgeon: Isaias Cowman, MD;  Location: [facility];  Service: Cardiovascular;  Laterality: N/A;   NASAL SINUS SURGERY  most recent 7-8 yrs ago   7 sinus surgeries    TRIGGER FINGER RELEASE  11/19/2011   Procedure: RELEASE TRIGGER FINGER/A-1 PULLEY;  Surgeon: Wynonia Sours, MD;  Location: [place];  Service: Orthopedics;  Laterality: Right;  release a-1 pulley right index finger and cyst removal   WRIST GANGLION EXCISION  1980's   right   HPI:  Per H&P "[name] is a 81 y.o. female with medical history significant of recent stroke with left-sided weakness, hypertension, hyperlipidemia, asthma, GERD, hypothyroidism, depression with anxiety, seizure, pancreatitis, anemia, dCHF, who presents with worsening left-sided weakness.     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 Per husband, pt got up around 1 AM this morning and went to use the restroom, started feeling worsening left-sided weakness, fainted for few minutes.  Not completely lost consciousness.  Patient also had slurred speech.  No vision loss or hearing loss. Patient does not have chest pain, fever or chills.  Patient has multiple episodes of nonbilious nonbloody vomiting.  No diarrhea.  Husband states that the patient  has intermittent abdominal pain in the past few weeks, which is located in the central abdomen, mild to moderate, sharp, nonradiating.  After vomiting, patient started coughing with little mucus production.  No symptoms of UTI.  Denies dark stool or rectal bleeding.     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  Tele neurology was consulted, recommended IV heparin with no bolus."    Assessment / Plan / Recommendation  Clinical Impression  Pt seen for clinical swallowing evaluation. Pt alert. Slow to respond. Some wordfinding difficulty noted. Cleared with RN.  Husband present at end of session.   Oral motor exam completed and  significant for mild flattening of L nasolabial fold with reduced symmetry noted at rest and with excursion. Mildly hoarse vocal quality appreciated which improved with POs.   Pt given trials of solid, pureed, and thin liquids as outlined below. Pt demonstrated an intact oral swallow. Pharyngeal swallow also appeared functional. No overt s/sx pharyngeal dysphagia. To palpation, pt with seemingly timely swallow initiation and seemingly adequate laryngeal elevation. No change to vocal quality across trials.   *Of note, pt with vomiting episode following cognitive-linguistic evaluation. Contacted GI PA. GI agreed for pt to start with clear liquids. MD to advance as tolerated.   Pt, RN, and GI PA made aware of results, recommendations, and SLP POC.   SLP Visit Diagnosis: Dysphagia, unspecified (R13.10)    Aspiration Risk  Mild aspiration risk due to nausea/vomiting; oropharyngeal swallow appears WFL   Diet Recommendation  (MD to advance diet to regular/thin; will start with clears per GI)   Medication Administration:  (as tolerated) Supervision: Staff to assist with self feeding;Full supervision/cueing for compensatory strategies Compensations: Slow rate;Small sips/bites Postural Changes: Seated upright at 90 degrees;Remain upright for at least 30 minutes after po intake    Other  Recommendations Recommended Consults:  (GI following) Oral Care Recommendations: Oral care QID;Staff/trained caregiver to provide oral care    Recommendations for follow up therapy are one component of a multi-disciplinary discharge planning process, led by the attending physician.  Recommendations may be updated based on patient status, additional functional criteria and insurance authorization.  Follow up Recommendations  (TBD)         Functional Status Assessment Patient has not had a recent decline in their functional status (for swallowing)         Prognosis Prognosis for Safe Diet Advancement:  Fair Barriers to Reach Goals:  (nausea/vomiting)      Swallow Study   General Date of Onset: 11/23/22 HPI: Per H&P "[name] is a 81 y.o. female with medical history significant of recent stroke with left-sided weakness, hypertension, hyperlipidemia, asthma, GERD, hypothyroidism, depression with anxiety, seizure, pancreatitis, anemia, dCHF, who presents with worsening left-sided weakness.     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 Per husband, pt got up around 1 AM this morning and went to use the restroom, started feeling worsening left-sided weakness, fainted for few minutes.  Not completely lost consciousness.  Patient also had slurred speech.  No vision loss or hearing loss. Patient does not have chest pain, fever or chills.  Patient has multiple episodes of nonbilious nonbloody vomiting.  No diarrhea.  Husband states that the patient has intermittent abdominal pain in the past few weeks, which is located in the central abdomen, mild to moderate, sharp, nonradiating.  After vomiting, patient started coughing with little mucus production.  No symptoms of UTI.  Denies dark stool or rectal bleeding.     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  Tele neurology was consulted, recommended IV heparin with no bolus." Type of Study: Bedside Swallow Evaluation Previous Swallow Assessment: seen for clinical swallowing evaluation earlier this year following stroke; participated in outpatient SLP services for cognitive-communication deficits Diet Prior to this Study: NPO Respiratory Status: Nasal cannula History of Recent Intubation: No Behavior/Cognition: Alert;Requires cueing Oral Cavity Assessment: Dry Oral Care Completed by SLP: Yes Oral Cavity - Dentition: Adequate natural dentition Vision: Functional for self-feeding Self-Feeding Abilities: Able to feed self;Needs assist Patient Positioning: Upright in bed Baseline Vocal Quality:  Hoarse Volitional Cough: Strong Volitional Swallow: Able to elicit    Oral/Motor/Sensory Function Overall Oral Motor/Sensory Function: Mild impairment Facial ROM: Reduced left Facial Symmetry: Abnormal symmetry left Lingual Symmetry: Within Functional Limits Lingual Strength: Within Functional Limits Velum: Within Functional Limits Mandible: Within Functional Limits   Ice Chips Ice chips: Not tested   Thin Liquid Thin Liquid: Within functional limits Presentation: Straw;Self Fed Other Comments: ~3 oz    Nectar Thick Nectar Thick Liquid: Not tested   Honey Thick Honey Thick Liquid: Not tested   Puree Puree: Within functional limits Presentation: Self Fed Other Comments: x2 teaspoons   Solid     Solid: Within functional limits Presentation: Self Fed Other Comments: x2 saltines presented in quarters     [name], M.S., [facility] [phone] (ASCOM)  [name] 11/24/2022,9:56 AM

## 2022-11-25 ENCOUNTER — Ambulatory Visit: Payer: Medicare HMO

## 2022-11-25 ENCOUNTER — Ambulatory Visit: Payer: Medicare HMO | Admitting: Occupational Therapy

## 2022-11-25 DIAGNOSIS — R1084 Generalized abdominal pain: Secondary | ICD-10-CM | POA: Diagnosis not present

## 2022-11-25 DIAGNOSIS — I639 Cerebral infarction, unspecified: Secondary | ICD-10-CM | POA: Diagnosis not present

## 2022-11-25 DIAGNOSIS — E785 Hyperlipidemia, unspecified: Secondary | ICD-10-CM | POA: Diagnosis not present

## 2022-11-25 DIAGNOSIS — J690 Pneumonitis due to inhalation of food and vomit: Secondary | ICD-10-CM | POA: Diagnosis not present

## 2022-11-25 LAB — BASIC METABOLIC PANEL
Anion gap: 8 (ref 5–15)
BUN: <5 mg/dL — ABNORMAL LOW (ref 8–23)
CO2: 23 mmol/L (ref 22–32)
Calcium: 8.5 mg/dL — ABNORMAL LOW (ref 8.9–10.3)
Chloride: 110 mmol/L (ref 98–111)
Creatinine, Ser: 1.07 mg/dL — ABNORMAL HIGH (ref 0.44–1.00)
GFR, Estimated: 53 mL/min — ABNORMAL LOW (ref >60)
Glucose, Bld: 119 mg/dL — ABNORMAL HIGH (ref 70–99)
Potassium: 2.7 mmol/L — CL (ref 3.5–5.1)
Sodium: 141 mmol/L (ref 135–145)

## 2022-11-25 LAB — CBC
HCT: 26 % — ABNORMAL LOW (ref 36.0–46.0)
Hemoglobin: 8.2 g/dL — ABNORMAL LOW (ref 12.0–15.0)
MCH: 28.3 pg (ref 26.0–34.0)
MCHC: 31.5 g/dL (ref 30.0–36.0)
MCV: 89.7 fL (ref 80.0–100.0)
Platelets: 425 10*3/uL — ABNORMAL HIGH (ref 150–400)
RBC: 2.9 MIL/uL — ABNORMAL LOW (ref 3.87–5.11)
RDW: 14.7 % (ref 11.5–15.5)
WBC: 9.2 10*3/uL (ref 4.0–10.5)
nRBC: 0 % (ref 0.0–0.2)

## 2022-11-25 LAB — MAGNESIUM: Magnesium: 1.1 mg/dL — ABNORMAL LOW (ref 1.7–2.4)

## 2022-11-25 LAB — PHOSPHORUS: Phosphorus: 3.4 mg/dL (ref 2.5–4.6)

## 2022-11-25 MED ORDER — ONDANSETRON HCL 4 MG PO TABS
8.0000 mg | ORAL_TABLET | Freq: Three times a day (TID) | ORAL | Status: AC
  Administered 2022-11-25 – 2022-11-26 (×3): 8 mg via ORAL
  Filled 2022-11-25 (×3): qty 2

## 2022-11-25 MED ORDER — AMOXICILLIN-POT CLAVULANATE 500-125 MG PO TABS
1.0000 | ORAL_TABLET | Freq: Two times a day (BID) | ORAL | Status: DC
  Administered 2022-11-25 – 2022-11-27 (×4): 1 via ORAL
  Filled 2022-11-25 (×4): qty 1

## 2022-11-25 MED ORDER — POTASSIUM CHLORIDE 10 MEQ/100ML IV SOLN
10.0000 meq | INTRAVENOUS | Status: AC
  Administered 2022-11-25 (×4): 10 meq via INTRAVENOUS
  Filled 2022-11-25 (×3): qty 100

## 2022-11-25 MED ORDER — ONDANSETRON HCL 4 MG PO TABS: 8.0000 mg | ORAL_TABLET | Freq: Three times a day (TID) | ORAL | Status: DC

## 2022-11-25 NOTE — Plan of Care (Addendum)
Addendum:  Patient has a Linq recorder that was interrogated today and showed no e/o a fib. She has f/u in a few weeks for repeat interrogation. F/u with Dr. Manuella Ghazi per below.  Su Monks, MD [facility] [phone]  If 7pm- 7am, please page neurology on call as listed in [place]. Neurology plan of care  Summary of stroke workup:  MRI brain wo 1. 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 2. 9 mm right occipital pole infarct. 3. Punctate white matter infarct in the left occipital lobe. 4. Additional punctate cortical infarct in the medial right occipital lobe more superiorly. 5. Expected evolution of previous right posterior frontal lobe white matter infarct. 6. Stable atrophy and white matter disease likely reflects the sequela of chronic microvascular ischemia.  CTA H&N 1. 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 2. No other intracranial arterial occlusion or high-grade stenosis. 3. 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  TTE showed no intracardiac clot  Stroke Labs     Component Value Date/Time   CHOL 138 11/24/2022 0241   TRIG 193 (H) 11/24/2022 0241   HDL 51 11/24/2022 0241   CHOLHDL 2.7 11/24/2022 0241   VLDL 39 11/24/2022 0241   LDLCALC 48 11/24/2022 0241    Lab Results  Component Value Date/Time   HGBA1C 6.1 (H) 11/23/2022 08:41 AM   A/P: [name] is a 81 y.o. female with a past medical history significant for right thalamic and corona radiata stroke (August 18, 2022), hypertension, hyperlipidemia, asthma, congestive heart failure, pancreatitis, anxiety/depression, one remote seizure not on any antiseizure medications. She has had 2 ischemic events in 3 mos. The first was felt to be 2/2 small vessel disease but the current infarcts are 2/2 R vertebral occlusion that is new despite  having no stenosis there 3 mos ago. This is concerning for central embolic source. TTE showed no intracardiac clot. TEE was considered for further evaluation but patient does not want any more procedures unless they are absolutely necessary. Given her frailty and known R vert occlusion I feel she is not a good candidate for the procedure regardless. Recommend extended amb cardiac monitoring to evaluate for occult a fib. Stroke workup is otherwise completed.  - Amb cardiac monitoring per above and f/u with cardiology - ASA '81mg'$  daily + plavix '75mg'$  daily x21 days f/b plavix '75mg'$  daily monotherapy after that - She should call Dr. Trena Platt office for earlier f/u appt given new stroke (currently scheduled for f/u in Feb 2024); I will also route him this plan of care - Neurology to sign off, please re-engage if additional neurologic concerns arise  Su Monks, MD [facility] [phone]  If 7pm- 7am, please page neurology on call as listed in [place].

## 2022-11-25 NOTE — Progress Notes (Signed)
Per Dr Sheppard Coil, [place] monitoring

## 2022-11-25 NOTE — Progress Notes (Signed)
PROGRESS NOTE    [name]   MCN:[id number] DOB: 06-25-1941  DOA: 11/23/2022 Date of Service: 11/25/22 PCP: Idelle Crouch, MD     Brief Narrative / Hospital Course:  [name] is a 81 y.o. female with medical history significant of recent stroke with left-sided weakness, hypertension, hyperlipidemia, asthma, GERD, hypothyroidism, depression with anxiety, seizure, pancreatitis, anemia, dCHF, who presents with worsening left-sided weakness.  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 Per husband, pt got up around 1 AM this morning and went to use the restroom, started feeling worsening left-sided weakness, fainted for few minutes.  Not completely lost consciousness.  Patient also had slurred speech. Also multiple episodes of nonbilious nonbloody vomiting.  12/17:  WBC 11.6, lactic acid 11.9, INR 1.0, PTT 26, troponin level 7, 10, UDS negative, negative urinalysis, alcohol level less than 10, AKI with creatinine 1.79, BUN 22, GFR 29 (recent baseline creatinine 0.9 09/05/2022), temperature normal, blood pressure 145/75, heart rate 102, RR 25, oxygen saturation 99% on room air. 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  Tele neurology consulted, initially recommended IV heparin with no bolus, repeat Echo, later held heparin. Continued vomiting turning to coffee-grounds type material.  CT abd/pelvis noted 8.4 cm mass-like inflammatory/edematous changes in the anterior pelvic fat cephalad to the urinary bladder, possibly contusion if history of regional trauma; neoplasm eg liposarcoma would be a less common etiology. GI consulted, recommend PPI, unlikely GI bleed, cosinder CTA if worsening but no plan for endoscopy at this time. General surgery consulted - opinion that less likely mass more likely contusion, consider MRI pelvis. 12/18: still nauseous. Neurology cleared to restart ASA/Plavix. Hypokalemia this morning to 2.7. Improved to 3.2 12/19:  neurology cleared for d/c, cardiology interrogated loop recorder, no events, will continue to record and follow outpatient.   Consultants:  Neurology GI General Surgery   Procedures: none      ASSESSMENT & PLAN:   Principal Problem:   Stroke [facility]) Active Problems:   Occlusion of right vertebral artery   Aspiration pneumonia (HCC)   Abdominal pain   Coffee ground emesis   Essential hypertension   Acquired hypothyroidism   HLD (hyperlipidemia)   Asthma   Chronic diastolic CHF (congestive heart failure) (HCC)   Normocytic anemia   AKI (acute kidney injury) ([place])   Depression with anxiety   Mesenteric mass  Stroke [facility]) Occlusion of right vertebral artery Neurology following SLP eval mild aspiration risk d/t N/V Restart ASA & Plavix Echo pending TTE, consider TEE per neurology  PT/OT recs for inpatient rehab  AKI (acute kidney injury) ([place]) Improving/resolved IV fluids can d/c when taking po better Monitor BMP  Hypokalemia Likely d/t emesis Replete as needed Monitor BMP - remains low, pharmacy consulted for repletion help  Aspiration pneumonia (HCC) Continue Unasyn Sputum culture Prn albuterol and mucinex   Abdominal pain Coffee ground emesis Cleared by GI, no procedures planned PPI Symptomatic care for nausea Monitor CBC and s/s bleeding  Mesenteric mass Cleared by general surgery, likely contusion Consider MRI if needed   Essential hypertension Home BP meds held for now w/ AKI, dehydraition  Acquired hypothyroidism Continue synthroid  HLD (hyperlipidemia) Continue lipitor   Asthma Bronchodilators  Chronic diastolic CHF (congestive heart failure) ([place]) 2D echo on 08/20/2022 showed EF of 55-8% with grade 1 diastolic dysfunction. Patient does not have leg edema JVD. CHF seem to be compensated.  Caution w/ fluids for above   Normocytic  anemia Monitor CBC  Depression with anxiety Continue home meds    DVT prophylaxis: SCD Pertinent IV  fluids/nutrition: dextrose NS given hypoglycemia  Central lines / invasive devices: none   Code Status: DNR  Disposition: inpatient  TOC needs: inpatient rehab Barriers to discharge / significant pending items: hypokalemia, not eating needing IV fluids              Subjective:  Patient reports nausea is improved  Denies CP/SOB.  Denies new weakness.  Not tolerating diet but cleared as above per SLP  Reports no concerns w/ urination/defecation.   Family Communication: saw husband at bedside on rounds     Objective Findings:  Vitals:   11/24/22 1149 11/24/22 2118 11/25/22 0132 11/25/22 0623  BP: 135/69 (!) 142/67 (!) 123/53 (!) 119/56  Pulse: 98 (!) 102 96 98  Resp: '17 18 18 18  '$ Temp: 98.5 F (36.9 C) 97.6 F (36.4 C) 98.2 F (36.8 C) 98 F (36.7 C)  TempSrc: Oral     SpO2: 100% 98% 95% 98%  Weight:      Height:        Intake/Output Summary (Last 24 hours) at 11/25/2022 1646 Last data filed at 11/25/2022 1439 Gross per 24 hour  Intake 1732.31 ml  Output 1200 ml  Net 532.31 ml   Filed Weights   11/23/22 0145  Weight: 48.4 kg    Examination: Constitutional:  VS as above General Appearance: alert, well-developed, well-nourished, NAD Respiratory: Normal respiratory effort No wheeze No rhonchi No rales Cardiovascular: S1/S2 normal No murmur No rub/gallop auscultated No lower extremity edema Gastrointestinal: No tenderness Neurological: No cranial nerve deficit on limited exam Alert Psychiatric: Normal judgment/insight Normal mood and affect       Scheduled Medications:   amoxicillin-clavulanate  1 tablet Oral Q12H   aspirin EC  81 mg Oral Daily   atorvastatin  40 mg Oral Daily   clopidogrel  75 mg Oral Daily   folic acid  1 mg Oral Daily   levothyroxine  50 mcg Oral QAC breakfast   montelukast  10 mg Oral QPM   multivitamin with minerals  1 tablet Oral Daily   ondansetron  8 mg Oral TID AC   pantoprazole  40 mg Intravenous  Q12H    Continuous Infusions:  sodium chloride Stopped (11/24/22 1205)   dextrose 5 % and 0.9% NaCl 100 mL/hr at 11/25/22 0851   potassium chloride     promethazine (PHENERGAN) injection (IM or IVPB)      PRN Medications:  acetaminophen **OR** acetaminophen (TYLENOL) oral liquid 160 mg/5 mL **OR** acetaminophen, albuterol, dextromethorphan-guaiFENesin, hydrALAZINE, morphine injection, ondansetron (ZOFRAN) IV, promethazine (PHENERGAN) injection (IM or IVPB), senna-docusate  Antimicrobials:  Anti-infectives (From admission, onward)    Start     Dose/Rate Route Frequency Ordered Stop   11/25/22 1700  amoxicillin-clavulanate (AUGMENTIN) 500-125 MG per tablet 1 tablet        1 tablet Oral Every 12 hours 11/25/22 1033 11/28/22 0959   11/23/22 1800  Ampicillin-Sulbactam (UNASYN) 3 g in sodium chloride 0.9 % 100 mL IVPB  Status:  Discontinued        3 g 200 mL/hr over 30 Minutes Intravenous Every 12 hours 11/23/22 0840 11/25/22 1033   11/23/22 0415  Ampicillin-Sulbactam (UNASYN) 3 g in sodium chloride 0.9 % 100 mL IVPB        3 g 200 mL/hr over 30 Minutes Intravenous  Once 11/23/22 0408 11/23/22 0542  Data Reviewed: I have personally reviewed following labs and imaging studies  CBC: Recent Labs  Lab 11/23/22 0204 11/23/22 1902 11/23/22 2142 11/24/22 0241 11/24/22 1013 11/24/22 1715 11/25/22 1554  WBC 11.6*   < > 13.0* 11.7* 13.3* 12.6* 9.2  NEUTROABS 9.1*  --   --   --   --   --   --   HGB 10.5*   < > 9.2* 8.6* 9.2* 9.3* 8.2*  HCT 34.0*   < > 29.2* 27.2* 29.0* 29.0* 26.0*  MCV 92.9   < > 90.4 90.4 90.9 90.6 89.7  PLT 546*   < > 501* 462* 486* 466* 425*   < > = values in this interval not displayed.   Basic Metabolic Panel: Recent Labs  Lab 11/23/22 0204 11/24/22 0241 11/24/22 1715 11/25/22 1554  NA 142 144  --  141  K 3.7 2.7* 3.2* 2.7*  CL 109 111  --  110  CO2 21* 24  --  23  GLUCOSE 179* 76  --  119*  BUN 22 13  --  <5*  CREATININE 1.79* 1.36*  --   1.07*  CALCIUM 9.6 8.8*  --  8.5*   GFR: Estimated Creatinine Clearance: 28.6 mL/min (A) (by C-G formula based on SCr of 1.07 mg/dL (H)). Liver Function Tests: Recent Labs  Lab 11/23/22 0204  AST 34  ALT 24  ALKPHOS 64  BILITOT 0.9  PROT 6.9  ALBUMIN 3.8   Recent Labs  Lab 11/23/22 0412  LIPASE 32   No results for input(s): "AMMONIA" in the last 168 hours. Coagulation Profile: Recent Labs  Lab 11/23/22 0204  INR 1.0   Cardiac Enzymes: No results for input(s): "CKTOTAL", "CKMB", "CKMBINDEX", "TROPONINI" in the last 168 hours. BNP (last 3 results) No results for input(s): "PROBNP" in the last 8760 hours. HbA1C: Recent Labs    11/23/22 0841  HGBA1C 6.1*   CBG: Recent Labs  Lab 11/23/22 2148 11/24/22 0756 11/24/22 0910 11/24/22 1009 11/24/22 1135  GLUCAP 119* 67* 66* 158* 80   Lipid Profile: Recent Labs    11/24/22 0241  CHOL 138  HDL 51  LDLCALC 48  TRIG 193*  CHOLHDL 2.7   Thyroid Function Tests: No results for input(s): "TSH", "T4TOTAL", "FREET4", "T3FREE", "THYROIDAB" in the last 72 hours. Anemia Panel: No results for input(s): "VITAMINB12", "FOLATE", "FERRITIN", "TIBC", "IRON", "RETICCTPCT" in the last 72 hours. Most Recent Urinalysis On File:     Component Value Date/Time   COLORURINE STRAW (A) 11/23/2022 0204   APPEARANCEUR CLEAR (A) 11/23/2022 0204   LABSPEC 1.026 11/23/2022 0204   PHURINE 6.0 11/23/2022 0204   GLUCOSEU 50 (A) 11/23/2022 0204   HGBUR MODERATE (A) 11/23/2022 0204   BILIRUBINUR NEGATIVE 11/23/2022 0204   KETONESUR NEGATIVE 11/23/2022 0204   PROTEINUR NEGATIVE 11/23/2022 0204   UROBILINOGEN 0.2 04/11/2011 1030   NITRITE NEGATIVE 11/23/2022 0204   LEUKOCYTESUR NEGATIVE 11/23/2022 0204   Sepsis Labs: '@LABRCNTIP'$ (procalcitonin:4,lacticidven:4)  Recent Results (from the past 240 hour(s))  Culture, blood (routine x 2)     Status: None (Preliminary result)   Collection Time: 11/23/22  4:12 AM   Specimen: BLOOD RIGHT ARM   Result Value Ref Range Status   Specimen Description BLOOD RIGHT ARM  Final   Special Requests   Final    BOTTLES DRAWN AEROBIC AND ANAEROBIC Blood Culture adequate volume   Culture   Final    NO GROWTH 2 DAYS Performed at [facility], [address], [place], [place] [address]  Report Status PENDING  Incomplete  Culture, blood (routine x 2)     Status: None (Preliminary result)   Collection Time: 11/23/22  4:12 AM   Specimen: BLOOD RIGHT ARM  Result Value Ref Range Status   Specimen Description BLOOD RIGHT ARM  Final   Special Requests   Final    BOTTLES DRAWN AEROBIC AND ANAEROBIC Blood Culture adequate volume   Culture   Final    NO GROWTH 2 DAYS Performed at [facility], [address] [place], [place] [address]    Report Status PENDING  Incomplete         Radiology Studies: ECHOCARDIOGRAM COMPLETE  Result Date: 11/24/2022    ECHOCARDIOGRAM REPORT   Patient Name:   [name] Date of Exam: 11/24/2022 Medical Rec #:  [id number]  Height:       59.0 in Accession #:    [id number] Weight:       106.7 lb Date of Birth:  1941/09/26 BSA:          1.412 m Patient Age:    56 years   BP:           122/45 mmHg Patient Gender: F          HR:           87 bpm. Exam Location:  [facility] Procedure: 2D Echo, Color Doppler and Cardiac Doppler Indications:     I63.9 Stroke  History:         Patient has prior history of Echocardiogram examinations, most                  recent 08/20/2022. Risk Factors:Hypertension and Dyslipidemia.  Sonographer:     Charmayne Sheer Referring Phys:  [id number] Lorenza Chick Diagnosing Phys: Kathlyn Sacramento MD  Sonographer Comments: No subcostal window. IMPRESSIONS  1. Left ventricular ejection fraction, by estimation, is 55 to 60%. The left ventricle has normal function. The left ventricle has no regional wall motion abnormalities. Left ventricular diastolic parameters are consistent with Grade I diastolic dysfunction (impaired relaxation).  2. Right  ventricular systolic function is normal. The right ventricular size is normal. Tricuspid regurgitation signal is inadequate for assessing PA pressure.  3. The mitral valve is normal in structure. No evidence of mitral valve regurgitation. No evidence of mitral stenosis.  4. The aortic valve is normal in structure. Aortic valve regurgitation is not visualized. Aortic valve sclerosis is present, with no evidence of aortic valve stenosis.  5. The inferior vena cava is normal in size with greater than 50% respiratory variability, suggesting right atrial pressure of 3 mmHg. FINDINGS  Left Ventricle: Left ventricular ejection fraction, by estimation, is 55 to 60%. The left ventricle has normal function. The left ventricle has no regional wall motion abnormalities. The left ventricular internal cavity size was normal in size. There is  no left ventricular hypertrophy. Left ventricular diastolic parameters are consistent with Grade I diastolic dysfunction (impaired relaxation). Right Ventricle: The right ventricular size is normal. No increase in right ventricular wall thickness. Right ventricular systolic function is normal. Tricuspid regurgitation signal is inadequate for assessing PA pressure. Left Atrium: Left atrial size was normal in size. Right Atrium: Right atrial size was normal in size. Pericardium: There is no evidence of pericardial effusion. Mitral Valve: The mitral valve is normal in structure. No evidence of mitral valve regurgitation. No evidence of mitral valve stenosis. Tricuspid Valve: The tricuspid valve is normal in structure. Tricuspid valve regurgitation is not demonstrated.  No evidence of tricuspid stenosis. Aortic Valve: The aortic valve is normal in structure. Aortic valve regurgitation is not visualized. Aortic valve sclerosis is present, with no evidence of aortic valve stenosis. Aortic valve mean gradient measures 6.0 mmHg. Aortic valve peak gradient measures 11.4 mmHg. Aortic valve area, by VTI  measures 1.71 cm. Pulmonic Valve: The pulmonic valve was normal in structure. Pulmonic valve regurgitation is trivial. No evidence of pulmonic stenosis. Aorta: The aortic root is normal in size and structure. Venous: The inferior vena cava is normal in size with greater than 50% respiratory variability, suggesting right atrial pressure of 3 mmHg. IAS/Shunts: No atrial level shunt detected by color flow Doppler.  LEFT VENTRICLE PLAX 2D LVIDd:         3.60 cm   Diastology LVIDs:         2.50 cm   LV e' medial:    8.05 cm/s LV PW:         1.20 cm   LV E/e' medial:  13.5 LV IVS:        0.90 cm   LV e' lateral:   11.70 cm/s LVOT diam:     1.80 cm   LV E/e' lateral: 9.3 LV SV:         57 LV SV Index:   40 LVOT Area:     2.54 cm  RIGHT VENTRICLE RV Basal diam:  2.70 cm RV Mid diam:    3.60 cm RV S prime:     25.00 cm/s TAPSE (M-mode): 3.9 cm LEFT ATRIUM             Index        RIGHT ATRIUM           Index LA diam:        3.70 cm 2.62 cm/m   RA Area:     11.70 cm LA Vol (A2C):   23.4 ml 16.57 ml/m  RA Volume:   25.30 ml  17.92 ml/m LA Vol (A4C):   39.8 ml 28.18 ml/m LA Biplane Vol: 31.9 ml 22.59 ml/m  AORTIC VALVE                     PULMONIC VALVE AV Area (Vmax):    1.75 cm      PV Vmax:       1.27 m/s AV Area (Vmean):   1.68 cm      PV Vmean:      88.900 cm/s AV Area (VTI):     1.71 cm      PV VTI:        0.286 m AV Vmax:           169.00 cm/s   PV Peak grad:  6.5 mmHg AV Vmean:          115.000 cm/s  PV Mean grad:  4.0 mmHg AV VTI:            0.333 m AV Peak Grad:      11.4 mmHg AV Mean Grad:      6.0 mmHg LVOT Vmax:         116.00 cm/s LVOT Vmean:        75.700 cm/s LVOT VTI:          0.224 m LVOT/AV VTI ratio: 0.67  AORTA Ao Root diam: 2.70 cm MITRAL VALVE MV Area (PHT): 4.49 cm     SHUNTS MV Decel Time: 169 msec     Systemic VTI:  0.22  m MV E velocity: 109.00 cm/s  Systemic Diam: 1.80 cm MV A velocity: 130.00 cm/s MV E/A ratio:  0.84 Kathlyn Sacramento MD Electronically signed by Kathlyn Sacramento MD Signature  Date/Time: 11/24/2022/1:17:18 PM    Final             LOS: 2 days    Emeterio Reeve, DO [facility] 11/25/2022, 4:46 PM    Dictation software may have been used to generate the above note. Typos may occur and escape review in typed/dictated notes. Please contact Dr Sheppard Coil directly for clarity if needed.  Staff may message me via secure chat in [place]  but this may not receive an immediate response,  please page me for urgent matters!  If 7PM-7AM, please contact night coverage [email or web address]

## 2022-11-25 NOTE — Plan of Care (Signed)

## 2022-11-25 NOTE — Progress Notes (Signed)
  Inpatient Rehabilitation Admissions Coordinator   I spoke with patient's spouse by phone at her bedside .She was recently at [facility] 9/23 and did well. Discharged home at supervision level and proceeded with outpatient therapy. Spouse states she has been down in the past few weeks physically with the flu as well as with depression. He would like to discuss with her further if she would like to pursue [facility] admit again. I will follow up tomorrow. . Please call me with any questions.   Danne Baxter, RN, MSN Rehab Admissions Coordinator [phone]

## 2022-11-25 NOTE — Progress Notes (Signed)
Physical Therapy Treatment Patient Details Name: [name] MRN: [id number] DOB: Feb 09, 1941 Today's Date: 11/25/2022   History of Present Illness Pt is an 81 y.o. female presenting to hospital 12/17 with c/o weakness and syncopal episode (assisted to ground by her husband)--upon wakening pt felt weaker on L side; h/o stroke 3 months prior with L sided deficits.  Pt also c/o HA, nausea, and vomiting (coffee ground looking).  Imaging showing extensive acute nonhemorrhagic infarcts involving the posteroinferior cerebellum B (R>L), 9 mm R occipital pole infarct, punctate white matter infarct in L occipital lobe, and punctate cortical infarct in the medial R occipital lobe more superiorly.  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  PMH includes anemia, asthma, back pain, htn, knee pain, PNA, pancreatitis, PNA, seizures, SOB, stroke, thyroid disease, CTR, foot osteotomy, B hand sx, R TKA.    PT Comments    Pt sleeping in recliner upon PT arrival; woken with vc's; pt's husband present during session.  Recent nausea medication given by nursing.  During session pt min to mod assist with transfers and ambulation 15 feet with RW use.  Pt tending to have forward lean in standing requiring cueing and assist to maintain upright (vc's also required to stay closer to RW during ambulation).  Pt reporting feeling very weak d/t not eating (d/t nausea/vomiting)--MD reporting adjusting nausea medication with meal schedule.  Will continue to focus on strengthening, balance, and progressive functional mobility during hospitalization.    Recommendations for follow up therapy are one component of a multi-disciplinary discharge planning process, led by the attending physician.  Recommendations may be updated based on patient status, additional functional criteria and insurance authorization.  Follow Up Recommendations  Acute inpatient rehab (3hours/day)     Assistance Recommended at Discharge  Frequent or constant Supervision/Assistance  Patient can return home with the following A little help with bathing/dressing/bathroom;Assistance with cooking/housework;Assist for transportation;Help with stairs or ramp for entrance;A lot of help with walking and/or transfers   Equipment Recommendations  Rolling walker (2 wheels);BSC/3in1;Wheelchair (measurements PT);Wheelchair cushion (measurements PT)    Recommendations for Other Services OT consult     Precautions / Restrictions Precautions Precautions: Fall Precaution Comments: Aspiration Restrictions Weight Bearing Restrictions: No     Mobility  Bed Mobility               General bed mobility comments: Deferred (pt sitting in recliner beginning/end of session)    Transfers Overall transfer level: Needs assistance Equipment used: Rolling walker (2 wheels) Transfers: Sit to/from Stand Sit to Stand: Min assist, Mod assist           General transfer comment: x2 trials standing from recliner; assist to steady and for safety/balance (pt tending to shift weight forward)    Ambulation/Gait Ambulation/Gait assistance: Min assist, Mod assist Gait Distance (Feet): 15 Feet Assistive device: Rolling walker (2 wheels)   Gait velocity: decreased     General Gait Details: assist for balance and to maintain upright (pt tending to have forward lean); assist and cueing to stay closer to RW; decreased stance time L LE   Stairs             Wheelchair Mobility    Modified Rankin (Stroke Patients Only)       Balance Overall balance assessment: Needs assistance Sitting-balance support: Feet supported, No upper extremity supported Sitting balance-Leahy Scale: Good Sitting balance - Comments: steady sitting reaching within BOS   Standing balance support: During functional activity, Bilateral upper  extremity supported, Reliant on assistive device for balance Standing balance-Leahy Scale: Poor Standing balance  comment: assist for balance with standing activities                            Cognition Arousal/Alertness: Awake/alert, Lethargic (Pt sleeping in recliner upon PT arrival but woken with vc's) Behavior During Therapy: Flat affect Overall Cognitive Status: Impaired/Different from baseline                                 General Comments: Increased time to process during session; vc's for technique        Exercises      General Comments  Nursing cleared pt for participation in physical therapy.  Pt agreeable to PT session.  Pt's husband present during session.      Pertinent Vitals/Pain Pain Assessment Pain Assessment: No/denies pain Vitals (HR and O2 on room air) stable and WFL throughout treatment session.    Home Living                          Prior Function            PT Goals (current goals can now be found in the care plan section) Acute Rehab PT Goals Patient Stated Goal: to improve mobility PT Goal Formulation: With patient Time For Goal Achievement: 12/08/22 Potential to Achieve Goals: Good    Frequency    7X/week      PT Plan      Co-evaluation              AM-PAC PT "6 Clicks" Mobility   Outcome Measure  Help needed turning from your back to your side while in a flat bed without using bedrails?: None Help needed moving from lying on your back to sitting on the side of a flat bed without using bedrails?: A Lot Help needed moving to and from a bed to a chair (including a wheelchair)?: A Little Help needed standing up from a chair using your arms (e.g., wheelchair or bedside chair)?: A Little Help needed to walk in hospital room?: A Lot Help needed climbing 3-5 steps with a railing? : Total 6 Click Score: 15    End of Session Equipment Utilized During Treatment: Gait belt Activity Tolerance: Other (comment) (Pt reporting feeling weak d/t not eating) Patient left: in chair;with call bell/phone within  reach;with chair alarm set;with family/visitor present (B heels floating via pillow support) Nurse Communication: Mobility status;Precautions;Other (comment) (pt incontinent of urine in chair (therapist changed pt's linen on chair and also changed pt's gown)) PT Visit Diagnosis: Other abnormalities of gait and mobility (R26.89);Muscle weakness (generalized) (M62.81);Other symptoms and signs involving the nervous system (R29.898);Unsteadiness on feet (R26.81)     Time: 7096-2836 PT Time Calculation (min) (ACUTE ONLY): 34 min  Charges:  $Gait Training: 8-22 mins $Therapeutic Activity: 8-22 mins                     Leitha Bleak, PT 11/25/22, 2:34 PM

## 2022-11-25 NOTE — Consult Note (Signed)
PHARMACY CONSULT NOTE - FOLLOW UP  Pharmacy Consult for Electrolyte Monitoring and Replacement   Recent Labs: Potassium (mmol/L)  Date Value  11/25/2022 2.7 (LL)   Calcium (mg/dL)  Date Value  11/25/2022 8.5 (L)   Albumin (g/dL)  Date Value  11/23/2022 3.8   Sodium (mmol/L)  Date Value  11/25/2022 141     Assessment: 81 yo F with PMH HTN, HLD, hypothyroidism, seizure, HFpEF presents with CVA and now with aspiration pneumonia. Pt is hypokalemic likely d/t emesis and pharmacy has been consulted to help manage electrolytes. Pt's renal function is improving with their Scr nearly at baseline of <1.  Goal of Therapy:  Electrolytes WNL  Plan:  K 2.7 >> Kcl 10 mEq IV x 4 Corrected Ca 8.7 >> no replacement necessary Mg lab ordered Phos lab ordered  [name], PharmD PGY-1 Pharmacy Resident 11/25/2022 5:44 PM

## 2022-11-26 DIAGNOSIS — I639 Cerebral infarction, unspecified: Secondary | ICD-10-CM | POA: Diagnosis not present

## 2022-11-26 LAB — BASIC METABOLIC PANEL
Anion gap: 5 (ref 5–15)
Anion gap: 8 (ref 5–15)
BUN: <5 mg/dL — ABNORMAL LOW (ref 8–23)
BUN: <5 mg/dL — ABNORMAL LOW (ref 8–23)
CO2: 23 mmol/L (ref 22–32)
CO2: 21 mmol/L — ABNORMAL LOW (ref 22–32)
Calcium: 8.3 mg/dL — ABNORMAL LOW (ref 8.9–10.3)
Calcium: 8.2 mg/dL — ABNORMAL LOW (ref 8.9–10.3)
Chloride: 113 mmol/L — ABNORMAL HIGH (ref 98–111)
Chloride: 111 mmol/L (ref 98–111)
Creatinine, Ser: 1.08 mg/dL — ABNORMAL HIGH (ref 0.44–1.00)
Creatinine, Ser: 1.25 mg/dL — ABNORMAL HIGH (ref 0.44–1.00)
GFR, Estimated: 52 mL/min — ABNORMAL LOW (ref >60)
GFR, Estimated: 44 mL/min — ABNORMAL LOW (ref >60)
Glucose, Bld: 117 mg/dL — ABNORMAL HIGH (ref 70–99)
Glucose, Bld: 127 mg/dL — ABNORMAL HIGH (ref 70–99)
Potassium: 2.7 mmol/L — CL (ref 3.5–5.1)
Potassium: 3.4 mmol/L — ABNORMAL LOW (ref 3.5–5.1)
Sodium: 141 mmol/L (ref 135–145)
Sodium: 140 mmol/L (ref 135–145)

## 2022-11-26 LAB — VITAMIN B12: Vitamin B-12: 1023 pg/mL — ABNORMAL HIGH (ref 180–914)

## 2022-11-26 LAB — MAGNESIUM
Magnesium: 1.1 mg/dL — ABNORMAL LOW (ref 1.7–2.4)
Magnesium: 2.4 mg/dL (ref 1.7–2.4)

## 2022-11-26 LAB — IRON AND TIBC
Iron: 16 ug/dL — ABNORMAL LOW (ref 28–170)
Saturation Ratios: 6 % — ABNORMAL LOW (ref 10.4–31.8)
TIBC: 262 ug/dL (ref 250–450)
UIBC: 246 ug/dL

## 2022-11-26 LAB — PHOSPHORUS: Phosphorus: 3.1 mg/dL (ref 2.5–4.6)

## 2022-11-26 LAB — FOLATE: Folate: 34 ng/mL (ref >5.9)

## 2022-11-26 LAB — VITAMIN D 25 HYDROXY (VIT D DEFICIENCY, FRACTURES): Vit D, 25-Hydroxy: 46.79 ng/mL (ref 30–100)

## 2022-11-26 MED ORDER — POTASSIUM CHLORIDE 10 MEQ/100ML IV SOLN
10.0000 meq | INTRAVENOUS | Status: AC
  Administered 2022-11-26 (×4): 10 meq via INTRAVENOUS
  Filled 2022-11-26 (×3): qty 100

## 2022-11-26 MED ORDER — SODIUM CHLORIDE 0.9 % IV SOLN
500.0000 mg | INTRAVENOUS | Status: DC
  Administered 2022-11-26: 500 mg via INTRAVENOUS
  Filled 2022-11-26 (×2): qty 25

## 2022-11-26 MED ORDER — POTASSIUM CHLORIDE CRYS ER 20 MEQ PO TBCR: 40.0000 meq | EXTENDED_RELEASE_TABLET | ORAL | Status: DC

## 2022-11-26 MED ORDER — ENOXAPARIN SODIUM 30 MG/0.3ML IJ SOSY
30.0000 mg | PREFILLED_SYRINGE | INTRAMUSCULAR | Status: DC
  Administered 2022-11-26: 30 mg via SUBCUTANEOUS
  Filled 2022-11-26: qty 0.3

## 2022-11-26 MED ORDER — POTASSIUM CHLORIDE IN NACL 20-0.9 MEQ/L-% IV SOLN
INTRAVENOUS | Status: DC
  Filled 2022-11-26 (×3): qty 1000

## 2022-11-26 MED ORDER — MAGNESIUM SULFATE 4 GM/100ML IV SOLN
4.0000 g | Freq: Once | INTRAVENOUS | Status: AC
  Administered 2022-11-26: 4 g via INTRAVENOUS
  Filled 2022-11-26: qty 100

## 2022-11-26 NOTE — Consult Note (Signed)
PHARMACY CONSULT NOTE - FOLLOW UP  Pharmacy Consult for Electrolyte Monitoring and Replacement   Recent Labs: Potassium (mmol/L)  Date Value  11/26/2022 2.7 (LL)   Magnesium (mg/dL)  Date Value  11/26/2022 1.1 (L)   Calcium (mg/dL)  Date Value  11/26/2022 8.3 (L)   Albumin (g/dL)  Date Value  11/23/2022 3.8   Phosphorus (mg/dL)  Date Value  11/26/2022 3.1   Sodium (mmol/L)  Date Value  11/26/2022 141     Assessment: 81 yo F with PMH HTN, HLD, hypothyroidism, seizure, HFpEF presents with CVA and now with aspiration pneumonia. Pt is hypokalemic likely d/t emesis and pharmacy has been consulted to help manage electrolytes. Pt's renal function is improving with their Scr nearly at baseline of <1.  Normal saline @ 75 mL/hr  Goal of Therapy:  Electrolytes WNL   Plan:  Give KCl IV 10 mEq x runs. Add Kcl 20 mEq/L to IVF. Magnesium 1.1. Give Magnesium sulfate 4 grams x 1 Follow up K+ and Mag in PM.   Glean Salvo, PharmD, BCPS Clinical Pharmacist  11/26/2022 7:46 AM

## 2022-11-26 NOTE — TOC Transition Note (Signed)
Transition of Care [facility]) - CM/SW Discharge Note   Patient Details  Name: [name] MRN: [id number] Date of Birth: 06-27-1941  [facility]) CM/SW Contact:  Quin Hoop, LCSW Phone Number: 11/26/2022, 11:49 AM   Clinical Narrative:    CSW completed TOC assessment with patient's husband, [name], in patient's room.  Patient was working with PT at the time.  Patient's PCP is Dr. Doy Hutching.  [name] transports patient to and from doctor appointments.  She gets her meds filled at [facility] on [address] and she has no concerns with getting medications.  CSW asked if patient had decided on CIR. [name] stated that he thinks that she's leaning that way, but he would confirm that today.     Final next level of care: IP Rehab Facility Barriers to Discharge: Continued Medical Work up   Patient Goals and CMS Choice          Discharge Placement                       Discharge Plan and Services                                     Social Determinants of Health (SDOH) Interventions     Readmission Risk Interventions     No data to display

## 2022-11-26 NOTE — Progress Notes (Signed)
Occupational Therapy Treatment Patient Details Name: [name] MRN: [id number] DOB: [birth year] Today's Date: 11/26/2022   History of present illness Pt is an 81 y.o. female presenting to hospital 12/17 with c/o weakness and syncopal episode (assisted to ground by her husband)--upon wakening pt felt weaker on L side; h/o stroke 3 months prior with L sided deficits.  Pt also c/o HA, nausea, and vomiting (coffee ground looking).  Imaging showing extensive acute nonhemorrhagic infarcts involving the posteroinferior cerebellum B (R>L), 9 mm R occipital pole infarct, punctate white matter infarct in L occipital lobe, and punctate cortical infarct in the medial R occipital lobe more superiorly.  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  PMH includes anemia, asthma, back pain, htn, knee pain, PNA, pancreatitis, PNA, seizures, SOB, stroke, thyroid disease, CTR, foot osteotomy, B hand sx, R TKA.   OT comments  Upon entering the room, pt supine in bed with no c/o pain and husband present in room. Pt is agreeable to OT intervention. Pt coming into long sitting with min A and unsupported with focus on figure four position in bed and bilateral coordination tasks to don B socks. Pt needing forced use of L UE during tasks. She becomes nauseated and vomits multiple times while sitting up but then agreeable to continue working with therapist. Therapy focused on L UE NMR for opposition, isolated finger movements, and reaching across midline to grasp and release objects. Pt with gross grasp but making significant improvements during session. OT discussed with pt and caregiver the importance of forced use of L UE to increase coordination. They verbalized understanding. Pt left in bed with head fully upright. All needs within reach. Pt continues to benefit from OT intervention and recommendation for inpatient rehab at hospital discharge.    Recommendations for follow up therapy are one component of a  multi-disciplinary discharge planning process, led by the attending physician.  Recommendations may be updated based on patient status, additional functional criteria and insurance authorization.    Follow Up Recommendations  Acute inpatient rehab (3hours/day)     Assistance Recommended at Discharge Frequent or constant Supervision/Assistance  Patient can return home with the following  A lot of help with walking and/or transfers;A lot of help with bathing/dressing/bathroom;Help with stairs or ramp for entrance;Assist for transportation;Direct supervision/assist for financial management;Direct supervision/assist for medications management   Equipment Recommendations  None recommended by OT       Precautions / Restrictions Precautions Precautions: Fall Precaution Comments: Aspiration              ADL either performed or assessed with clinical judgement     Vision Patient Visual Report: No change from baseline            Cognition Arousal/Alertness: Awake/alert Behavior During Therapy: Flat affect Overall Cognitive Status: Impaired/Different from baseline                                 General Comments: Increased time to process but follows 1 step commands.                   Pertinent Vitals/ Pain       Pain Assessment Pain Assessment: No/denies pain         Frequency  Min 3X/week        Progress Toward Goals  OT Goals(current goals can now be found in the care plan section)  Progress  towards OT goals: Progressing toward goals  Acute Rehab OT Goals Patient Stated Goal: to get stronger and go to rehab OT Goal Formulation: With patient/family Time For Goal Achievement: 12/08/22 Potential to Achieve Goals: Good  Plan Discharge plan remains appropriate;Frequency remains appropriate       AM-PAC OT "6 Clicks" Daily Activity     Outcome Measure   Help from another person eating meals?: A Little Help from another person taking care  of personal grooming?: A Little Help from another person toileting, which includes using toliet, bedpan, or urinal?: A Lot Help from another person bathing (including washing, rinsing, drying)?: A Lot Help from another person to put on and taking off regular upper body clothing?: A Little Help from another person to put on and taking off regular lower body clothing?: A Lot 6 Click Score: 15    End of Session Equipment Utilized During Treatment: Rolling walker (2 wheels)  OT Visit Diagnosis: Unsteadiness on feet (R26.81);Repeated falls (R29.6);Muscle weakness (generalized) (M62.81)   Activity Tolerance Patient limited by fatigue   Patient Left in bed;with call bell/phone within reach;with family/visitor present   Nurse Communication Mobility status        Time: 7342-8768 OT Time Calculation (min): 28 min  Charges: OT General Charges $OT Visit: 1 Visit OT Treatments $Self Care/Home Management : 8-22 mins $Neuromuscular Re-education: 8-22 mins  Darleen Crocker, MS, OTR/L , CBIS ascom [phone]  11/26/22, 12:11 PM

## 2022-11-26 NOTE — Progress Notes (Signed)
       CROSS COVER NOTE  NAME: [name] MRN: [id number] DOB : 12/20/1940 ATTENDING PHYSICIAN: Emeterio Reeve, DO    Date of Service   11/26/2022   HPI/Events of Note   Notified of critical K--> 2.7. Mg is 1.1.  Interventions   Assessment/Plan:  4g IV Mg 40 mEQ IV K        To reach the provider On-Call:   7AM- 7PM see care teams to locate the attending and reach out to them via [email or web address]. 7PM-7AM contact night-coverage If you still have difficulty reaching the appropriate provider, please page the [facility] (Director on Call) for [facility] on amion for assistance  This document was prepared using Set designer software and may include unintentional dictation errors.  Neomia Glass DNP, MBA, FNP-BC Nurse Practitioner [facility] Pager [phone]

## 2022-11-26 NOTE — Progress Notes (Signed)
Anticoagulation monitoring(Lovenox):  81yo  F ordered Lovenox 40 mg Q24h    Filed Weights   11/23/22 0145  Weight: 48.4 kg (106 lb 11.2 oz)   BMI 21   Lab Results  Component Value Date   CREATININE 1.08 (H) 11/26/2022   CREATININE 1.07 (H) 11/25/2022   CREATININE 1.36 (H) 11/24/2022   Estimated Creatinine Clearance: 28.3 mL/min (A) (by C-G formula based on SCr of 1.08 mg/dL (H)). Hemoglobin & Hematocrit     Component Value Date/Time   HGB 8.2 (L) 11/25/2022 1554   HCT 26.0 (L) 11/25/2022 1554     Per Protocol for Patient with estCrcl < 30 ml/min and BMI < 30, will transition to Lovenox 30 mg Q24h       Chinita Greenland PharmD Clinical Pharmacist 11/26/2022

## 2022-11-26 NOTE — PMR Pre-admission (Signed)
PMR Admission Coordinator Pre-Admission Assessment  Patient: [name] is an 81 y.o., female MRN: [id number] DOB: 1941-08-27 Height: '4\' 11"'$  (149.9 cm) Weight: 48.4 kg  Insurance Information HMO: yes    PPO:      PCP:      IPA:      80/20:      OTHER:  PRIMARY: [employer]      Policy#: [id number]      Subscriber: pt CM Name: Myriam Jacobson      Phone#: [phone] ext [phone]    Fax#: [phone] Pre-Cert#: [id number] f/u with lorraine phone [phone] ext [phone] fax [phone]      Employer:  Benefits:  Phone #: [phone]     Name: 11/26/22 Eff. Date: 12/08/21     Deduct: none      Out of Pocket Max: $3400      Life Max: none CIR: $295 co pay per day days 1 until 6      SNF: no c copay per day days 1 until 20, $196 co pay per day days 21 until 100 Outpatient: $10 to $20 per visit     Co-Pay: visit per medical neccesity Home Health: 100%      Co-Pay: visit per medical neccesity DME: 80%     Co-Pay: 20% Providers: in network  SECONDARY: none  Financial Counselor:       Phone#:   The Engineer, [employer]" for patients in Inpatient Rehabilitation Facilities with attached "Privacy Act [facility] Records" was provided and verbally reviewed with: Patient and Family  Emergency Contact Information Contact Information     Name Relation Home Work Mobile   Knapper,[name] Spouse Delevan Son   [phone]      Current Medical History  Patient Admitting Diagnosis: CVA  History of Present Illness: 81 year old female with 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 Also noted nausea and vomiting with coffee ground emesis.   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 Neurology consulted and initially recommended IV heparin with no bolus,  but later held  Heparin. MRI with extensive acute non hemorrhagic infarcts involving the posteroinferior cerebellum bilaterally, right greatest than left. 9 mm right occipital pole infarct. Punctuate white matter infarct in the left occipital lobe, additional punctuate cortical infarct in the medial right occipital lobe more superiorly.  With continued coffee ground emesis, CT abd and pelvis noted 8.4 cm mass like inflammatory/edematous changes in the anterior pelvic flat cephalad to the urinary bladder, possible contusion if history of regional trauma. GI consulted and recommended PPI, unlikely GI Bleed and to consider CTA if worsening but no plan for EGD at this time. General surgery consulted, and recommended that less likely mass more like contusion and to consider MRI pelvis. Neurology cleared to restart ASA and Plavix for 21 days followed by plavix for monotherapy.  Cardiology interrogated loop recorder, found no events and to follow up as an outpatient. Initial AKI resolved with IV fluids, hypokalemia due to nausea and to monitor BMP. Plan to replete as needed. Given Unasyn for aspiration pneumonia with prn albuterol an mucinex.   Patient's medical record from [facility] has been reviewed by the rehabilitation admission coordinator and physician.  Past Medical History  Past Medical History:  Diagnosis Date   Anemia    Arthritis  Asthma    uses inhaler just prior to surgery to avoid attack   Back pain    from previous injury   Complication of anesthesia    has woken  up during 2 different surgery   Depression    no current issue/treatment; situation   Gallstones    GERD (gastroesophageal reflux disease)    Hiatal hernia    patient does NOT have nerve/muscle disease   History of kidney stones    HLD (hyperlipidemia)    HTN (hypertension)    Hypothyroidism    Kidney stones    Knee pain    Non-diabetic pancreatic hormone dysfunction years   pt. 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   Pancreatitis     Pneumonia    Seizures ([place])    caused by dye injected during a procedure   Shortness of breath    with exertion   Sinus problem    frequent infections/congestion   Stroke ([place]) 2021   reports having CVA in 2021 and having mini strokes before that   Thyroid disease    Has the patient had major surgery during 100 days prior to admission? No  Family History   family history includes Anesthesia problems in her father; Breast cancer in her cousin, maternal aunt, and maternal grandmother; Heart attack in her paternal uncle; Heart attack (age of onset: 26) in her father; Heart disease in her father; Pancreatic cancer in her cousin; Stroke in her paternal grandfather.  Current Medications  Current Facility-Administered Medications:    0.9 % NaCl with KCl 20 mEq/ L  infusion, , Intravenous, Continuous, Wynelle Cleveland, RPH, Last Rate: 75 mL/hr at 11/26/22 0903, New Bag at 11/26/22 2993   acetaminophen (TYLENOL) tablet 650 mg, 650 mg, Oral, Q4H PRN **OR** acetaminophen (TYLENOL) 160 MG/5ML solution 650 mg, 650 mg, Per Tube, Q4H PRN **OR** acetaminophen (TYLENOL) suppository 650 mg, 650 mg, Rectal, Q4H PRN, Ivor Costa, MD   albuterol (PROVENTIL) (2.5 MG/3ML) 0.083% nebulizer solution 2.5 mg, 2.5 mg, Nebulization, Q4H PRN, Ivor Costa, MD   amoxicillin-clavulanate (AUGMENTIN) 500-125 MG per tablet 1 tablet, 1 tablet, Oral, Q12H, Wynelle Cleveland, RPH, 1 tablet at 11/26/22 7169   aspirin EC tablet 81 mg, 81 mg, Oral, Daily, Emeterio Reeve, DO, 81 mg at 11/26/22 0859   atorvastatin (LIPITOR) tablet 40 mg, 40 mg, Oral, Daily, Ivor Costa, MD, 40 mg at 11/26/22 6789   clopidogrel (PLAVIX) tablet 75 mg, 75 mg, Oral, Daily, Emeterio Reeve, DO, 75 mg at 11/26/22 3810   dextromethorphan-guaiFENesin ([place] DM) 30-600 MG per 12 hr tablet 1 tablet, 1 tablet, Oral, BID PRN, Ivor Costa, MD   folic acid (FOLVITE) tablet 1 mg, 1 mg, Oral, Daily, Ivor Costa, MD, 1 mg at 11/26/22 1751   hydrALAZINE  (APRESOLINE) injection 5 mg, 5 mg, Intravenous, Q2H PRN, Ivor Costa, MD   levothyroxine (SYNTHROID) tablet 50 mcg, 50 mcg, Oral, QAC breakfast, Ivor Costa, MD, 50 mcg at 11/26/22 0536   montelukast (SINGULAIR) tablet 10 mg, 10 mg, Oral, QPM, Ivor Costa, MD   morphine (PF) 2 MG/ML injection 0.5 mg, 0.5 mg, Intravenous, Q3H PRN, Ivor Costa, MD   multivitamin with minerals tablet 1 tablet, 1 tablet, Oral, Daily, Ivor Costa, MD   ondansetron [facility]) injection 4 mg, 4 mg, Intravenous, Q8H PRN, Ivor Costa, MD, 4 mg at 11/25/22 0851   pantoprazole (PROTONIX) injection 40 mg, 40 mg, Intravenous, Q12H, Annamaria Helling, DO, 40 mg at 11/26/22 0859   promethazine (PHENERGAN) 12.5 mg  in sodium chloride 0.9 % 50 mL IVPB, 12.5 mg, Intravenous, Q8H PRN, Ivor Costa, MD   senna-docusate (Senokot-S) tablet 1 tablet, 1 tablet, Oral, QHS PRN, Ivor Costa, MD  Patients Current Diet:  Diet Order             Diet Heart Room service appropriate? Yes; Fluid consistency: Thin  Diet effective now                  Precautions / Restrictions Precautions Precautions: Fall Precaution Comments: Aspiration Restrictions Weight Bearing Restrictions: No   Has the patient had 2 or more falls or a fall with injury in the past year? No  Prior Activity Level Limited Community (1-2x/wk): Mod I with RW until 2 weeks ago having the flu became weaker  Prior Functional Level Self Care: Did the patient need help bathing, dressing, using the toilet or eating? Needed some help  Indoor Mobility: Did the patient need assistance with walking from room to room (with or without device)? Independent  Stairs: Did the patient need assistance with internal or external stairs (with or without device)? Needed some help  Functional Cognition: Did the patient need help planning regular tasks such as shopping or remembering to take medications? Needed some help  Patient Information Are you of Hispanic, Latino/a,or Spanish  origin?: A. No, not of Hispanic, Latino/a, or Spanish origin What is your race?: A. White Do you need or want an interpreter to communicate with a doctor or health care staff?: 0. No  Patient's Response To:  Health Literacy and Transportation Is the patient able to respond to health literacy and transportation needs?: Yes Health Literacy - How often do you need to have someone help you when you read instructions, pamphlets, or other written material from your doctor or pharmacy?: Never In the past 12 months, has lack of transportation kept you from medical appointments or from getting medications?: No In the past 12 months, has lack of transportation kept you from meetings, work, or from getting things needed for daily living?: No  Home Assistive Devices / [place] Devices/Equipment: Environmental consultant (specify type) Home Equipment: Toilet riser, BSC/3in1, Rolling Walker (2 wheels), Shower seat, Other (comment)  Prior Device Use: Indicate devices/aids used by the patient prior to current illness, exacerbation or injury? Walker  Current Functional Level Cognition  Overall Cognitive Status: Impaired/Different from baseline Orientation Level: Oriented to person, Oriented to place, Disoriented to time, Disoriented to situation General Comments: Increased time to process but follows 1 step commands.    Extremity Assessment (includes Sensation/Coordination)  Upper Extremity Assessment: Defer to OT evaluation LUE Deficits / Details: per OT eval "decreased shoulder elevation and increased tone noted from prior CVA but pt reports more weakness than before."  Lower Extremity Assessment: Generalized weakness (able to perform B LE SLR independently; at least 3/5 AROM hip flexion, knee flexion/extension, and DF/PF; intacte B LE light touch, tone, and proprioception)    ADLs  Overall ADL's : Needs assistance/impaired    Mobility  Overal bed mobility: Needs Assistance Bed Mobility: Supine to  Sit Supine to sit: Mod assist, HOB elevated Sit to supine: Min assist General bed mobility comments: Deferred (pt sitting in recliner beginning/end of session)    Transfers  Overall transfer level: Needs assistance Equipment used: Rolling walker (2 wheels) Transfers: Sit to/from Stand Sit to Stand: Min assist, Mod assist Bed to/from chair/wheelchair/BSC transfer type:: Stand pivot Stand pivot transfers: Min assist General transfer comment: x2 trials standing from recliner; assist to  steady and for safety/balance (pt tending to shift weight forward)    Ambulation / Gait / Stairs / Wheelchair Mobility  Ambulation/Gait Ambulation/Gait assistance: Min assist, Mod assist Gait Distance (Feet): 15 Feet Assistive device: Rolling walker (2 wheels) General Gait Details: assist for balance and to maintain upright (pt tending to have forward lean); assist and cueing to stay closer to RW; decreased stance time L LE Gait velocity: decreased    Posture / Balance Dynamic Sitting Balance Sitting balance - Comments: steady sitting reaching within BOS Balance Overall balance assessment: Needs assistance Sitting-balance support: Feet supported, No upper extremity supported Sitting balance-Leahy Scale: Good Sitting balance - Comments: steady sitting reaching within BOS Standing balance support: During functional activity, Bilateral upper extremity supported, Reliant on assistive device for balance Standing balance-Leahy Scale: Poor Standing balance comment: assist for balance with standing activities    Special needs/care consideration Fall precautions   Previous Home Environment  Living Arrangements: Spouse/significant other  Lives With: Spouse Available Help at Discharge: Family, Available 24 hours/day Type of Home: House Home Layout: Two level, Able to live on main level with bedroom/bathroom Home Access: Stairs to enter Entrance Stairs-Rails: Right, Left Entrance Stairs-Number of Steps: 2  steps then small threshold into house Bathroom Shower/Tub: Multimedia programmer: Handicapped height Bathroom Accessibility: Yes How Accessible: Accessible via walker [place]: No (going to outpatient therapy since Sept CVA) Additional Comments: Owns 3WW  Discharge Living Setting Plans for Discharge Living Setting: Patient's home, Lives with (comment) (spouse) Type of Home at Discharge: House Discharge Home Layout: Two level, Able to live on main level with bedroom/bathroom Discharge Home Access: Stairs to enter Entrance Stairs-Rails: Right, Left, Can reach both Entrance Stairs-Number of Steps: 2 steps then small threshold into home Discharge Bathroom Shower/Tub: Walk-in shower Discharge Bathroom Toilet: Handicapped height Discharge Bathroom Accessibility: Yes How Accessible: Accessible via walker Does the patient have any problems obtaining your medications?: No  Social/Family/Support Systems Patient Roles: Spouse Contact Information: spouse, [name] Anticipated Caregiver: spouse Anticipated Ambulance person Information: see contacts Ability/Limitations of Caregiver: none Caregiver Availability: 24/7 Discharge Plan Discussed with Primary Caregiver: Yes Is Caregiver In Agreement with Plan?: Yes Does Caregiver/Family have Issues with Lodging/Transportation while Pt is in Rehab?: No  Goals Patient/Family Goal for Rehab: supervision with PT, OT, and SLP Expected length of stay: ELOS 10 to 14 days Additional Information: Recent CIr admit Sept 2023, Dr Tressa Busman Pt/Family Agrees to Admission and willing to participate: Yes Program Orientation Provided & Reviewed with Pt/Caregiver Including Roles  & Responsibilities: Yes  Decrease burden of Care through IP rehab admission: n/a  Possible need for SNF placement upon discharge: not anticipated  Patient Condition: I have reviewed medical records from [facility], spoken with  patient and spouse. I discussed via phone for  inpatient rehabilitation assessment.  Patient will benefit from ongoing PT, OT, and SLP, can actively participate in 3 hours of therapy a day 5 days of the week, and can make measurable gains during the admission.  Patient will also benefit from the coordinated team approach during an Inpatient Acute Rehabilitation admission.  The patient will receive intensive therapy as well as Rehabilitation physician, nursing, social worker, and care management interventions.  Due to bladder management, bowel management, safety, skin/wound care, disease management, medication administration, pain management, and patient education the patient requires 24 hour a day rehabilitation nursing.  The patient is currently mod assist overall with mobility and basic ADLs.  Discharge setting and therapy post discharge at home with outpatient  is anticipated.  Patient has agreed to participate in the Acute Inpatient Rehabilitation Program and will admit today.  Preadmission Screen Completed By:  Cleatrice Burke, 11/26/2022 1:31 PM ______________________________________________________________________   Discussed status with Dr. Curlene Dolphin on 11/27/22 at 1121 and received approval for admission today.  Admission Coordinator:  Cleatrice Burke, RN, time 5035 Date 11/27/22   Assessment/Plan: Diagnosis: CVA Does the need for close, 24 hr/day Medical supervision in concert with the patient's rehab needs make it unreasonable for this patient to be served in a less intensive setting? Yes Co-Morbidities requiring supervision/potential complications: AKI, Hypokalemia, hypomagnesia, aspiration PNA, HTN, HLD, Asthma, CHF, Anemia, depression  Due to bladder management, bowel management, safety, skin/wound care, disease management, medication administration, pain management, and patient education, does the patient require 24 hr/day rehab nursing? Yes Does the patient require coordinated care of a physician, rehab nurse, PT,  OT, and SLP to address physical and functional deficits in the context of the above medical diagnosis(es)? Yes Addressing deficits in the following areas: balance, endurance, locomotion, strength, transferring, bowel/bladder control, bathing, dressing, feeding, grooming, toileting, cognition, speech, language, swallowing, and psychosocial support Can the patient actively participate in an intensive therapy program of at least 3 hrs of therapy 5 days a week? Yes The potential for patient to make measurable gains while on inpatient rehab is good Anticipated functional outcomes upon discharge from inpatient rehab: supervision PT, supervision OT, supervision SLP Estimated rehab length of stay to reach the above functional goals is: 10-14 Anticipated discharge destination: Home 10. Overall Rehab/Functional Prognosis: good   MD Signature: Jennye Boroughs

## 2022-11-26 NOTE — Progress Notes (Signed)
Physical Therapy Treatment Patient Details Name: [name] MRN: [id number] DOB: 04/02/1941 Today's Date: 11/26/2022   History of Present Illness Pt is an 81 y.o. female presenting to hospital 12/17 with c/o weakness and syncopal episode (assisted to ground by her husband)--upon wakening pt felt weaker on L side; h/o stroke 3 months prior with L sided deficits.  Pt also c/o HA, nausea, and vomiting (coffee ground looking).  Imaging showing extensive acute nonhemorrhagic infarcts involving the posteroinferior cerebellum B (R>L), 9 mm R occipital pole infarct, punctate white matter infarct in L occipital lobe, and punctate cortical infarct in the medial R occipital lobe more superiorly.  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  PMH includes anemia, asthma, back pain, htn, knee pain, PNA, pancreatitis, PNA, seizures, SOB, stroke, thyroid disease, CTR, foot osteotomy, B hand sx, R TKA.    PT Comments    Pt resting in bed upon PT arrival eating lemon ice; pt's husband present; pt appearing alert and with good color in face; pt reporting feeling a little better with a little food in her.  Performed LE ex's in bed prior to OOB mobility.  During session pt min to mod assist for trunk semi-supine to sitting edge of bed; min assist with transfers using RW; and min to mod assist to ambulate 30 feet with RW use.  Although pt with fairly good L hand grip strength in general, pt noted with difficulty gripping walker at times (pt reporting having difficulty concentrating on it); pt also demonstrating forward plank type lean intermittently requiring cueing and assist to maintain upright posture; pt also requiring cueing for wide BOS and to stay more in middle of walker (pt tending to walk on L side (of inside) of walker with narrow BOS).  No N/V during session.  Pt appearing very motivated to participate in therapy (pt reporting enjoying OT session this morning) and improve strength, balance,  and independence with functional mobility.    Recommendations for follow up therapy are one component of a multi-disciplinary discharge planning process, led by the attending physician.  Recommendations may be updated based on patient status, additional functional criteria and insurance authorization.  Follow Up Recommendations  Acute inpatient rehab (3hours/day)     Assistance Recommended at Discharge Frequent or constant Supervision/Assistance  Patient can return home with the following A little help with bathing/dressing/bathroom;Assistance with cooking/housework;Assist for transportation;Help with stairs or ramp for entrance;A lot of help with walking and/or transfers   Equipment Recommendations  Rolling walker (2 wheels);BSC/3in1;Wheelchair (measurements PT);Wheelchair cushion (measurements PT)    Recommendations for Other Services OT consult     Precautions / Restrictions Precautions Precautions: Fall Precaution Comments: Aspiration Restrictions Weight Bearing Restrictions: No     Mobility  Bed Mobility Overal bed mobility: Needs Assistance Bed Mobility: Supine to Sit     Supine to sit: Min assist, Mod assist, HOB elevated     General bed mobility comments: assist for trunk; vc's for technique    Transfers Overall transfer level: Needs assistance Equipment used: Rolling walker (2 wheels) Transfers: Sit to/from Stand Sit to Stand: Min assist           General transfer comment: x1 trial standing from bed; vc's for UE placement; assist to steady    Ambulation/Gait Ambulation/Gait assistance: Min assist, Mod assist Gait Distance (Feet): 30 Feet Assistive device: Rolling walker (2 wheels)   Gait velocity: decreased     General Gait Details: assist for balance and to maintain upright (  pt tending to have forward plank lean); assist and cueing to stay closer to RW and for wider BOS; decreased stance time L LE   Stairs             Wheelchair  Mobility    Modified Rankin (Stroke Patients Only)       Balance Overall balance assessment: Needs assistance Sitting-balance support: Feet supported, No upper extremity supported Sitting balance-Leahy Scale: Good Sitting balance - Comments: steady sitting reaching within BOS   Standing balance support: During functional activity, Bilateral upper extremity supported, Reliant on assistive device for balance Standing balance-Leahy Scale: Poor Standing balance comment: assist for balance with standing activities (pt tending to have forward plank type lean when ambulating)                            Cognition Arousal/Alertness: Awake/alert Behavior During Therapy: WFL for tasks assessed/performed Overall Cognitive Status: Impaired/Different from baseline                                 General Comments: Increased time to process but follows 1 step commands.        Exercises General Exercises - Lower Extremity Ankle Circles/Pumps: AROM, Strengthening, Both, 10 reps, Supine Quad Sets: AROM, Strengthening, Both, 10 reps, Supine Heel Slides: AROM, Strengthening, Both, 10 reps, Supine    General Comments  Nursing cleared pt for participation in physical therapy.  Pt agreeable to PT session.      Pertinent Vitals/Pain Pain Assessment Pain Assessment: No/denies pain HR 90-115 bpm during sessions activities; O2 sats WFL on room air.    Home Living                   Home Equipment: Clinical biochemist (2 wheels);Shower seat;Other (comment) Additional Comments: Owns 3WW    Prior Function            PT Goals (current goals can now be found in the care plan section) Acute Rehab PT Goals Patient Stated Goal: to improve mobility PT Goal Formulation: With patient Time For Goal Achievement: 12/08/22 Potential to Achieve Goals: Good Progress towards PT goals: Progressing toward goals    Frequency    7X/week      PT Plan  Current plan remains appropriate    Co-evaluation              AM-PAC PT "6 Clicks" Mobility   Outcome Measure  Help needed turning from your back to your side while in a flat bed without using bedrails?: None Help needed moving from lying on your back to sitting on the side of a flat bed without using bedrails?: A Lot Help needed moving to and from a bed to a chair (including a wheelchair)?: A Little Help needed standing up from a chair using your arms (e.g., wheelchair or bedside chair)?: A Little Help needed to walk in hospital room?: A Lot Help needed climbing 3-5 steps with a railing? : Total 6 Click Score: 15    End of Session Equipment Utilized During Treatment: Gait belt Activity Tolerance: Other (comment) (no N/V during session) Patient left: in chair;with call bell/phone within reach;with chair alarm set;with family/visitor present;Other (comment) (B heels floating via pillow support) Nurse Communication: Mobility status;Precautions PT Visit Diagnosis: Other abnormalities of gait and mobility (R26.89);Muscle weakness (generalized) (M62.81);Other symptoms and signs involving the nervous system (R29.898);Unsteadiness on feet (R26.81)  Time: 7195-9747 PT Time Calculation (min) (ACUTE ONLY): 41 min  Charges:  $Gait Training: 8-22 mins $Therapeutic Exercise: 8-22 mins $Therapeutic Activity: 8-22 mins                     Leitha Bleak, PT 11/26/22, 2:41 PM

## 2022-11-26 NOTE — Progress Notes (Addendum)
[facility] Progress Note  Patient: [name]    ZJI:[id number]  DOA: 11/23/2022     Date of Service: the patient was seen and examined on 11/26/2022  Chief Complaint  Patient presents with   Weakness   Brief hospital course: [name] is a 81 y.o. female with medical history significant of recent stroke with left-sided weakness, hypertension, hyperlipidemia, asthma, GERD, hypothyroidism, depression with anxiety, seizure, pancreatitis, anemia, dCHF, who presents with worsening left-sided weakness.  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 Per husband, pt got up around 1 AM this morning and went to use the restroom, started feeling worsening left-sided weakness, fainted for few minutes.  Not completely lost consciousness.  Patient also had slurred speech. Also multiple episodes of nonbilious nonbloody vomiting.  12/17:  WBC 11.6, lactic acid 11.9, INR 1.0, PTT 26, troponin level 7, 10, UDS negative, negative urinalysis, alcohol level less than 10, AKI with creatinine 1.79, BUN 22, GFR 29 (recent baseline creatinine 0.9 09/05/2022), temperature normal, blood pressure 145/75, heart rate 102, RR 25, oxygen saturation 99% on room air. 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  Tele neurology consulted, initially recommended IV heparin with no bolus, repeat Echo, later held heparin. Continued vomiting turning to coffee-grounds type material.  CT abd/pelvis noted 8.4 cm mass-like inflammatory/edematous changes in the anterior pelvic fat cephalad to the urinary bladder, possibly contusion if history of regional trauma; neoplasm eg liposarcoma would be a less common etiology. GI consulted, recommend PPI, unlikely GI bleed, cosinder CTA if worsening but no plan for endoscopy at this time. General surgery consulted - opinion that less likely mass more likely contusion, consider MRI pelvis. 12/18: still nauseous. Neurology cleared to restart ASA/Plavix.  Hypokalemia this morning to 2.7. Improved to 3.2 12/19: neurology cleared for d/c, cardiology interrogated loop recorder, no events, will continue to record and follow outpatient.    Consultants:  Neurology GI General Surgery    Procedures: none   Assessment and Plan: Principal Problem:   Stroke [facility]) Active Problems:   Occlusion of right vertebral artery   Aspiration pneumonia (HCC)   Abdominal pain   Coffee ground emesis   Essential hypertension   Acquired hypothyroidism   HLD (hyperlipidemia)   Asthma   Chronic diastolic CHF (congestive heart failure) (HCC)   Normocytic anemia   AKI (acute kidney injury) ([place])   Depression with anxiety   Mesenteric mass   Stroke [facility]) Occlusion of right vertebral artery Neurology following SLP eval mild aspiration risk d/t N/V Restart ASA & Plavix TTE negative for any intracardiac source, patient may benefit from TEE but patient does not want any procedures so neurology recommended no need but patient will benefit from cardiac monitor as an outpatient to rule out occult A-fib.  Discussed with cardiology, patient already has implantable loop recorder, no arrhythmia noticed. PT/OT recs for inpatient rehab   AKI (acute kidney injury) ([place]) Improving/resolved IV fluids can d/c when taking po better Monitor BMP   Hypokalemia Likely d/t emesis Replete as needed Monitor BMP - remains low, pharmacy consulted for repletion help   Hypomagnesemia, mag repleted. Monitor and replete as needed.  Aspiration pneumonia (HCC) Continue Unasyn Sputum culture Prn albuterol and mucinex    Abdominal pain Coffee ground emesis Cleared by GI, no procedures planned PPI Symptomatic care for nausea Monitor CBC and s/s bleeding   Mesenteric mass Cleared by general surgery, likely contusion Consider MRI if needed   Essential  hypertension Home BP meds held for now w/ AKI, dehydraition   Acquired hypothyroidism Continue synthroid   HLD  (hyperlipidemia) Continue lipitor    Asthma Bronchodilators   Chronic diastolic CHF (congestive heart failure) ([place]) 2D echo on 08/20/2022 showed EF of 55-8% with grade 1 diastolic dysfunction. Patient does not have leg edema JVD. CHF seem to be compensated.  Caution w/ fluids for above    Normocytic anemia Monitor CBC Iron deficiency, iron level 16 and transfer steroid 6%, started Venofer 500 mg IV daily x 2 doses Folate within normal range  Depression with anxiety Continue home meds     Body mass index is 21.55 kg/m.  Interventions:       Diet: Heart healthy DVT Prophylaxis: Subcutaneous Lovenox   Advance goals of care discussion: DNR  Family Communication: family was present at bedside, at the time of interview.  The pt provided permission to discuss medical plan with the family. Opportunity was given to ask question and all questions were answered satisfactorily.   Disposition:  Pt is from Home, admitted with acute CVA, hypokalemia and hypomagnesemia, still has low electrolytes, which precludes a safe discharge. Discharge to Acute inpatient rehab, when stable most likely in 1 to 2 days.  Subjective: No significant events overnight, patient still has weakness in the left arm more than the left leg.  Denies any other active issues, no chest pain or shortness of breath.  Physical Exam: General:  alert oriented to time, place, and person.  Appear in no distress, affect appropriate Eyes: PERRLA ENT: Oral Mucosa Clear, moist  Neck: no JVD,  Cardiovascular: S1 and S2 Present, no Murmur,  Respiratory: good respiratory effort, Bilateral Air entry equal and Decreased, no Crackles, no wheezes Abdomen: Bowel Sound present, Soft and no tenderness,  Skin: no rashes Extremities: non Pedal edema, no calf tenderness Neurologic: Left arm leak power 4/5, slightly low power in the left lower extremity, right side normal.  No any other significant findings Gait not checked due to  patient safety concerns  Vitals:   11/25/22 1935 11/25/22 2155 11/26/22 0435 11/26/22 0819  BP: 130/64 139/73 117/89 (!) 127/57  Pulse: (!) 101 95 99 87  Resp: '20 18 17 16  '$ Temp: 98.6 F (37 C) 98 F (36.7 C) 99.1 F (37.3 C) 98.7 F (37.1 C)  TempSrc: Oral  Oral   SpO2: 99% 99% 93% 97%  Weight:      Height:        Intake/Output Summary (Last 24 hours) at 11/26/2022 1507 Last data filed at 11/26/2022 9381 Gross per 24 hour  Intake 3060.13 ml  Output --  Net 3060.13 ml   Filed Weights   11/23/22 0145  Weight: 48.4 kg    Data Reviewed: I have personally reviewed and interpreted daily labs, tele strips, imagings as discussed above. I reviewed all nursing notes, pharmacy notes, vitals, pertinent old records I have discussed plan of care as described above with RN and patient/family.  CBC: Recent Labs  Lab 11/23/22 0204 11/23/22 1902 11/23/22 2142 11/24/22 0241 11/24/22 1013 11/24/22 1715 11/25/22 1554  WBC 11.6*   < > 13.0* 11.7* 13.3* 12.6* 9.2  NEUTROABS 9.1*  --   --   --   --   --   --   HGB 10.5*   < > 9.2* 8.6* 9.2* 9.3* 8.2*  HCT 34.0*   < > 29.2* 27.2* 29.0* 29.0* 26.0*  MCV 92.9   < > 90.4 90.4 90.9 90.6 89.7  PLT  546*   < > 501* 462* 486* 466* 425*   < > = values in this interval not displayed.   Basic Metabolic Panel: Recent Labs  Lab 11/23/22 0204 11/24/22 0241 11/24/22 1715 11/25/22 1554 11/26/22 0412  NA 142 144  --  141 141  K 3.7 2.7* 3.2* 2.7* 2.7*  CL 109 111  --  110 113*  CO2 21* 24  --  23 23  GLUCOSE 179* 76  --  119* 117*  BUN 22 13  --  <5* <5*  CREATININE 1.79* 1.36*  --  1.07* 1.08*  CALCIUM 9.6 8.8*  --  8.5* 8.3*  MG  --   --   --  1.1* 1.1*  PHOS  --   --   --  3.4 3.1    Studies: No results found.  Scheduled Meds:  amoxicillin-clavulanate  1 tablet Oral Q12H   aspirin EC  81 mg Oral Daily   atorvastatin  40 mg Oral Daily   clopidogrel  75 mg Oral Daily   folic acid  1 mg Oral Daily   levothyroxine  50 mcg Oral  QAC breakfast   montelukast  10 mg Oral QPM   multivitamin with minerals  1 tablet Oral Daily   pantoprazole  40 mg Intravenous Q12H   Continuous Infusions:  0.9 % NaCl with KCl 20 mEq / L 75 mL/hr at 11/26/22 0903   promethazine (PHENERGAN) injection (IM or IVPB)     PRN Meds: acetaminophen **OR** acetaminophen (TYLENOL) oral liquid 160 mg/5 mL **OR** acetaminophen, albuterol, dextromethorphan-guaiFENesin, hydrALAZINE, morphine injection, ondansetron (ZOFRAN) IV, promethazine (PHENERGAN) injection (IM or IVPB), senna-docusate  Time spent: 55 minutes  Author: Val Riles. MD [facility] 11/26/2022 3:07 PM  To reach On-call, see care teams to locate the attending and reach out to them via [email or web address]. If 7PM-7AM, please contact night-coverage If you still have difficulty reaching the attending provider, please page the [facility] (Director on Call) for [facility] on amion for assistance.

## 2022-11-26 NOTE — Progress Notes (Signed)
Inpatient Rehabilitation Admissions Coordinator   I spoke with pt's spouse by phone at her bedside. They would like to pursue Cir admit. I will begin Auth with [facility] medicare today. CIR admit pending approval.  Danne Baxter, RN, MSN Rehab Admissions Coordinator [phone] 11/26/2022 11:57 AM

## 2022-11-26 NOTE — Progress Notes (Addendum)
SLP Cancellation Note  Patient Details Name: [name] MRN: [id number] DOB: 1941/11/04   Cancelled treatment:       Reason Eval/Treat Not Completed: Fatigue/lethargy limiting ability to participate  Despite multiple attempts, pt was not able to arouse at all. Her husband was at bedside and states that OT worked with pt for a "good 15 minutes" and that "has just wore her out." ST will try at next available opportunity.   Her husband voiced concerns regarding continued clear liquid diet. Secure chat sent to attending describing his concern.   Adabella Stanis B. Rutherford Nail, M.S., CCC-SLP, [place] Pathologist Certified Brain Injury [place]  [facility] [phone] Ascom [phone] Fax [phone]  Stormy Fabian 11/26/2022, 12:37 PM

## 2022-11-27 ENCOUNTER — Inpatient Hospital Stay (HOSPITAL_COMMUNITY)
Admission: RE | Admit: 2022-11-27 | Discharge: 2022-12-10 | DRG: 056 | Disposition: A | Discharge status: Home / Self Care | Payer: Medicare HMO | Source: Other Acute Inpatient Hospital | Attending: Physical Medicine & Rehabilitation | Admitting: Physical Medicine & Rehabilitation

## 2022-11-27 ENCOUNTER — Encounter: Payer: Medicare HMO | Admitting: Speech Pathology

## 2022-11-27 ENCOUNTER — Encounter (HOSPITAL_COMMUNITY): Payer: Self-pay | Admitting: Physical Medicine & Rehabilitation

## 2022-11-27 ENCOUNTER — Other Ambulatory Visit: Payer: Self-pay

## 2022-11-27 ENCOUNTER — Ambulatory Visit: Payer: Medicare HMO

## 2022-11-27 ENCOUNTER — Ambulatory Visit: Payer: Medicare HMO | Admitting: Occupational Therapy

## 2022-11-27 DIAGNOSIS — I10 Essential (primary) hypertension: Secondary | ICD-10-CM

## 2022-11-27 DIAGNOSIS — R52 Pain, unspecified: Secondary | ICD-10-CM | POA: Diagnosis not present

## 2022-11-27 DIAGNOSIS — E876 Hypokalemia: Secondary | ICD-10-CM | POA: Diagnosis present

## 2022-11-27 DIAGNOSIS — R441 Visual hallucinations: Secondary | ICD-10-CM | POA: Diagnosis present

## 2022-11-27 DIAGNOSIS — E785 Hyperlipidemia, unspecified: Secondary | ICD-10-CM | POA: Diagnosis present

## 2022-11-27 DIAGNOSIS — I69354 Hemiplegia and hemiparesis following cerebral infarction affecting left non-dominant side: Principal | ICD-10-CM

## 2022-11-27 DIAGNOSIS — F32A Depression, unspecified: Secondary | ICD-10-CM | POA: Diagnosis not present

## 2022-11-27 DIAGNOSIS — I639 Cerebral infarction, unspecified: Secondary | ICD-10-CM | POA: Diagnosis not present

## 2022-11-27 DIAGNOSIS — Z823 Family history of stroke: Secondary | ICD-10-CM

## 2022-11-27 DIAGNOSIS — J690 Pneumonitis due to inhalation of food and vomit: Secondary | ICD-10-CM | POA: Diagnosis present

## 2022-11-27 DIAGNOSIS — Z7902 Long term (current) use of antithrombotics/antiplatelets: Secondary | ICD-10-CM

## 2022-11-27 DIAGNOSIS — B961 Klebsiella pneumoniae [K. pneumoniae] as the cause of diseases classified elsewhere: Secondary | ICD-10-CM | POA: Diagnosis present

## 2022-11-27 DIAGNOSIS — K6389 Other specified diseases of intestine: Secondary | ICD-10-CM | POA: Diagnosis not present

## 2022-11-27 DIAGNOSIS — K219 Gastro-esophageal reflux disease without esophagitis: Secondary | ICD-10-CM | POA: Diagnosis present

## 2022-11-27 DIAGNOSIS — Z8249 Family history of ischemic heart disease and other diseases of the circulatory system: Secondary | ICD-10-CM | POA: Diagnosis not present

## 2022-11-27 DIAGNOSIS — R443 Hallucinations, unspecified: Secondary | ICD-10-CM | POA: Diagnosis not present

## 2022-11-27 DIAGNOSIS — Z7982 Long term (current) use of aspirin: Secondary | ICD-10-CM

## 2022-11-27 DIAGNOSIS — D509 Iron deficiency anemia, unspecified: Secondary | ICD-10-CM | POA: Diagnosis present

## 2022-11-27 DIAGNOSIS — N179 Acute kidney failure, unspecified: Secondary | ICD-10-CM | POA: Diagnosis present

## 2022-11-27 DIAGNOSIS — F418 Other specified anxiety disorders: Secondary | ICD-10-CM | POA: Diagnosis not present

## 2022-11-27 DIAGNOSIS — Z90710 Acquired absence of both cervix and uterus: Secondary | ICD-10-CM

## 2022-11-27 DIAGNOSIS — E039 Hypothyroidism, unspecified: Secondary | ICD-10-CM | POA: Diagnosis not present

## 2022-11-27 DIAGNOSIS — I5032 Chronic diastolic (congestive) heart failure: Secondary | ICD-10-CM | POA: Diagnosis present

## 2022-11-27 DIAGNOSIS — M1712 Unilateral primary osteoarthritis, left knee: Secondary | ICD-10-CM | POA: Diagnosis present

## 2022-11-27 DIAGNOSIS — R531 Weakness: Secondary | ICD-10-CM

## 2022-11-27 DIAGNOSIS — K5900 Constipation, unspecified: Secondary | ICD-10-CM | POA: Diagnosis present

## 2022-11-27 DIAGNOSIS — Z79899 Other long term (current) drug therapy: Secondary | ICD-10-CM | POA: Diagnosis not present

## 2022-11-27 DIAGNOSIS — R440 Auditory hallucinations: Secondary | ICD-10-CM | POA: Diagnosis present

## 2022-11-27 DIAGNOSIS — E781 Pure hyperglyceridemia: Secondary | ICD-10-CM | POA: Diagnosis present

## 2022-11-27 DIAGNOSIS — J45909 Unspecified asthma, uncomplicated: Secondary | ICD-10-CM | POA: Diagnosis present

## 2022-11-27 DIAGNOSIS — I69392 Facial weakness following cerebral infarction: Secondary | ICD-10-CM

## 2022-11-27 DIAGNOSIS — Z79890 Hormone replacement therapy (postmenopausal): Secondary | ICD-10-CM

## 2022-11-27 DIAGNOSIS — Z803 Family history of malignant neoplasm of breast: Secondary | ICD-10-CM

## 2022-11-27 DIAGNOSIS — R1084 Generalized abdominal pain: Secondary | ICD-10-CM

## 2022-11-27 DIAGNOSIS — Z7952 Long term (current) use of systemic steroids: Secondary | ICD-10-CM | POA: Diagnosis not present

## 2022-11-27 DIAGNOSIS — Z8673 Personal history of transient ischemic attack (TIA), and cerebral infarction without residual deficits: Secondary | ICD-10-CM | POA: Diagnosis not present

## 2022-11-27 DIAGNOSIS — Z96651 Presence of right artificial knee joint: Secondary | ICD-10-CM | POA: Diagnosis present

## 2022-11-27 DIAGNOSIS — I110 Hypertensive heart disease with heart failure: Secondary | ICD-10-CM | POA: Diagnosis not present

## 2022-11-27 DIAGNOSIS — N390 Urinary tract infection, site not specified: Secondary | ICD-10-CM | POA: Diagnosis present

## 2022-11-27 DIAGNOSIS — Z800 Family history of malignant neoplasm of digestive organs: Secondary | ICD-10-CM

## 2022-11-27 DIAGNOSIS — R5383 Other fatigue: Secondary | ICD-10-CM

## 2022-11-27 LAB — BASIC METABOLIC PANEL
Anion gap: 6 (ref 5–15)
BUN: <5 mg/dL — ABNORMAL LOW (ref 8–23)
CO2: 22 mmol/L (ref 22–32)
Calcium: 8.4 mg/dL — ABNORMAL LOW (ref 8.9–10.3)
Chloride: 115 mmol/L — ABNORMAL HIGH (ref 98–111)
Creatinine, Ser: 1.28 mg/dL — ABNORMAL HIGH (ref 0.44–1.00)
GFR, Estimated: 42 mL/min — ABNORMAL LOW (ref >60)
Glucose, Bld: 89 mg/dL (ref 70–99)
Potassium: 3.6 mmol/L (ref 3.5–5.1)
Sodium: 143 mmol/L (ref 135–145)

## 2022-11-27 LAB — CBC
HCT: 27 % — ABNORMAL LOW (ref 36.0–46.0)
HCT: 24.8 % — ABNORMAL LOW (ref 36.0–46.0)
Hemoglobin: 8.5 g/dL — ABNORMAL LOW (ref 12.0–15.0)
Hemoglobin: 8 g/dL — ABNORMAL LOW (ref 12.0–15.0)
MCH: 28.8 pg (ref 26.0–34.0)
MCH: 29.1 pg (ref 26.0–34.0)
MCHC: 31.5 g/dL (ref 30.0–36.0)
MCHC: 32.3 g/dL (ref 30.0–36.0)
MCV: 91.5 fL (ref 80.0–100.0)
MCV: 90.2 fL (ref 80.0–100.0)
Platelets: 462 10*3/uL — ABNORMAL HIGH (ref 150–400)
Platelets: 427 10*3/uL — ABNORMAL HIGH (ref 150–400)
RBC: 2.95 MIL/uL — ABNORMAL LOW (ref 3.87–5.11)
RBC: 2.75 MIL/uL — ABNORMAL LOW (ref 3.87–5.11)
RDW: 14.7 % (ref 11.5–15.5)
RDW: 15 % (ref 11.5–15.5)
WBC: 12.1 10*3/uL — ABNORMAL HIGH (ref 4.0–10.5)
WBC: 10.5 10*3/uL (ref 4.0–10.5)
nRBC: 0 % (ref 0.0–0.2)
nRBC: 0 % (ref 0.0–0.2)

## 2022-11-27 LAB — MAGNESIUM: Magnesium: 2.1 mg/dL (ref 1.7–2.4)

## 2022-11-27 LAB — CREATININE, SERUM
Creatinine, Ser: 1.28 mg/dL — ABNORMAL HIGH (ref 0.44–1.00)
GFR, Estimated: 42 mL/min — ABNORMAL LOW (ref >60)

## 2022-11-27 LAB — PHOSPHORUS: Phosphorus: 3.1 mg/dL (ref 2.5–4.6)

## 2022-11-27 MED ORDER — ADULT MULTIVITAMIN W/MINERALS CH
1.0000 | ORAL_TABLET | Freq: Every day | ORAL | Status: DC
  Administered 2022-11-28 – 2022-12-10 (×13): 1 via ORAL
  Filled 2022-11-27 (×14): qty 1

## 2022-11-27 MED ORDER — POLYSACCHARIDE IRON COMPLEX 150 MG PO CAPS
150.0000 mg | ORAL_CAPSULE | Freq: Every day | ORAL | Status: DC
  Administered 2022-11-27 – 2022-12-10 (×14): 150 mg via ORAL
  Filled 2022-11-27 (×14): qty 1

## 2022-11-27 MED ORDER — DICLOFENAC SODIUM 1 % EX GEL
2.0000 g | Freq: Four times a day (QID) | CUTANEOUS | Status: DC
  Administered 2022-11-27 – 2022-12-07 (×26): 2 g via TOPICAL
  Filled 2022-11-27: qty 100

## 2022-11-27 MED ORDER — FOLIC ACID 1 MG PO TABS
1.0000 mg | ORAL_TABLET | Freq: Every day | ORAL | Status: DC
  Administered 2022-11-28 – 2022-12-10 (×13): 1 mg via ORAL
  Filled 2022-11-27 (×13): qty 1

## 2022-11-27 MED ORDER — ACETAMINOPHEN 160 MG/5ML PO SOLN: 650.0000 mg | ORAL | Status: DC | PRN

## 2022-11-27 MED ORDER — ACETAMINOPHEN 650 MG RE SUPP: 650.0000 mg | RECTAL | Status: DC | PRN

## 2022-11-27 MED ORDER — PROPRANOLOL HCL 20 MG PO TABS: 60.0000 mg | ORAL_TABLET | Freq: Two times a day (BID) | ORAL | Status: DC

## 2022-11-27 MED ORDER — ATORVASTATIN CALCIUM 40 MG PO TABS
40.0000 mg | ORAL_TABLET | Freq: Every day | ORAL | Status: DC
  Administered 2022-11-28 – 2022-12-10 (×13): 40 mg via ORAL
  Filled 2022-11-27 (×13): qty 1

## 2022-11-27 MED ORDER — SENNOSIDES-DOCUSATE SODIUM 8.6-50 MG PO TABS: 1.0000 | ORAL_TABLET | Freq: Every evening | ORAL | Status: DC | PRN

## 2022-11-27 MED ORDER — ACETAMINOPHEN 325 MG PO TABS: 650.0000 mg | ORAL_TABLET | ORAL | Status: DC | PRN

## 2022-11-27 MED ORDER — GABAPENTIN 300 MG PO CAPS
300.0000 mg | ORAL_CAPSULE | Freq: Two times a day (BID) | ORAL | Status: DC
  Administered 2022-11-27 – 2022-12-10 (×26): 300 mg via ORAL
  Filled 2022-11-27 (×26): qty 1

## 2022-11-27 MED ORDER — POLYETHYLENE GLYCOL 3350 17 G PO PACK
17.0000 g | PACK | Freq: Two times a day (BID) | ORAL | Status: DC
  Administered 2022-11-27 – 2022-11-28 (×2): 17 g via ORAL
  Filled 2022-11-27 (×4): qty 1

## 2022-11-27 MED ORDER — POLYSACCHARIDE IRON COMPLEX 150 MG PO CAPS: 150.0000 mg | ORAL_CAPSULE | Freq: Every day | ORAL | 2 refills | Status: DC

## 2022-11-27 MED ORDER — AMLODIPINE BESYLATE 10 MG PO TABS: 10.0000 mg | ORAL_TABLET | Freq: Every day | ORAL | Status: DC

## 2022-11-27 MED ORDER — ASPIRIN 81 MG PO TBEC
81.0000 mg | DELAYED_RELEASE_TABLET | Freq: Every day | ORAL | Status: DC
  Administered 2022-11-28 – 2022-12-10 (×13): 81 mg via ORAL
  Filled 2022-11-27 (×13): qty 1

## 2022-11-27 MED ORDER — ENOXAPARIN SODIUM 30 MG/0.3ML IJ SOSY
30.0000 mg | PREFILLED_SYRINGE | INTRAMUSCULAR | Status: DC
  Administered 2022-11-27 – 2022-12-09 (×13): 30 mg via SUBCUTANEOUS
  Filled 2022-11-27 (×13): qty 0.3

## 2022-11-27 MED ORDER — LEVOTHYROXINE SODIUM 50 MCG PO TABS
50.0000 ug | ORAL_TABLET | Freq: Every day | ORAL | Status: DC
  Administered 2022-11-28 – 2022-12-10 (×13): 50 ug via ORAL
  Filled 2022-11-27 (×14): qty 1

## 2022-11-27 MED ORDER — DULOXETINE HCL 30 MG PO CPEP
30.0000 mg | ORAL_CAPSULE | Freq: Every day | ORAL | Status: DC
  Administered 2022-11-27 – 2022-12-10 (×14): 30 mg via ORAL
  Filled 2022-11-27 (×13): qty 1

## 2022-11-27 MED ORDER — PREDNISONE 5 MG PO TABS
10.0000 mg | ORAL_TABLET | Freq: Every day | ORAL | Status: DC
  Administered 2022-11-28 – 2022-12-10 (×13): 10 mg via ORAL
  Filled 2022-11-27 (×15): qty 2

## 2022-11-27 MED ORDER — PANTOPRAZOLE SODIUM 40 MG IV SOLR
40.0000 mg | Freq: Two times a day (BID) | INTRAVENOUS | Status: DC
  Administered 2022-11-27: 40 mg via INTRAVENOUS
  Filled 2022-11-27 (×2): qty 10

## 2022-11-27 MED ORDER — DM-GUAIFENESIN ER 30-600 MG PO TB12: 1.0000 | ORAL_TABLET | Freq: Two times a day (BID) | ORAL | Status: DC | PRN

## 2022-11-27 MED ORDER — AMOXICILLIN-POT CLAVULANATE 500-125 MG PO TABS
1.0000 | ORAL_TABLET | Freq: Two times a day (BID) | ORAL | Status: AC
  Administered 2022-11-27: 1 via ORAL
  Filled 2022-11-27: qty 1

## 2022-11-27 MED ORDER — CLOPIDOGREL BISULFATE 75 MG PO TABS
75.0000 mg | ORAL_TABLET | Freq: Every day | ORAL | Status: DC
  Administered 2022-11-28 – 2022-12-10 (×13): 75 mg via ORAL
  Filled 2022-11-27 (×13): qty 1

## 2022-11-27 MED ORDER — MONTELUKAST SODIUM 10 MG PO TABS
10.0000 mg | ORAL_TABLET | Freq: Every evening | ORAL | Status: DC
  Administered 2022-11-27 – 2022-12-09 (×13): 10 mg via ORAL
  Filled 2022-11-27 (×13): qty 1

## 2022-11-27 MED ORDER — ALBUTEROL SULFATE (2.5 MG/3ML) 0.083% IN NEBU: 2.5000 mg | INHALATION_SOLUTION | RESPIRATORY_TRACT | Status: DC | PRN

## 2022-11-27 MED ORDER — ASPIRIN 81 MG PO TBEC: 81.0000 mg | DELAYED_RELEASE_TABLET | Freq: Every day | ORAL | 0 refills | Status: DC

## 2022-11-27 MED ORDER — ENOXAPARIN SODIUM 30 MG/0.3ML IJ SOSY: 30.0000 mg | PREFILLED_SYRINGE | INTRAMUSCULAR | Status: DC

## 2022-11-27 MED ORDER — PANTOPRAZOLE SODIUM 40 MG PO TBEC
40.0000 mg | DELAYED_RELEASE_TABLET | Freq: Two times a day (BID) | ORAL | Status: DC
  Administered 2022-11-28 – 2022-12-10 (×25): 40 mg via ORAL
  Filled 2022-11-27 (×26): qty 1

## 2022-11-27 NOTE — Progress Notes (Signed)
Received call from [place], [name] doesn't have IV Access and IV team stated they were unable to place IV. GI note from 11/24/2022, was reviewed. [name], with  no complaints of nausea or vomiting at this time. Call placed to pharmacy and medication list was reviewed, Protonix IV was changed to PO, will continue to monitor. [name] is aware of the above and verbalizes understanding.

## 2022-11-27 NOTE — Discharge Summary (Signed)
[facility] Discharge Summary   Patient: [name] XYV:[id number]  PCP: Idelle Crouch, MD  Date of admission: 11/23/2022   Date of discharge:  11/27/2022     Discharge Diagnoses:  Principal Problem:   Stroke [facility]) Active Problems:   Occlusion of right vertebral artery   Aspiration pneumonia (HCC)   Abdominal pain   Coffee ground emesis   Essential hypertension   Acquired hypothyroidism   HLD (hyperlipidemia)   Asthma   Chronic diastolic CHF (congestive heart failure) (HCC)   Normocytic anemia   AKI (acute kidney injury) ([place])   Depression with anxiety   Mesenteric mass   Admitted From: Home Disposition:   Acute inpatient rehab    Recommendations for Outpatient Follow-up:  PCP: Patient should be seen by an MD, continue to monitor BP and titrate medications accordingly.  Discontinued losartan due to renal failure. Repeat BMP after 1 week to check potassium level and creatinine level and manage accordingly. Follow-up with neurology in 1 week.  Continue dual antiplatelet therapy for total 21 days and then take only Plavix.  Follow up LABS/TEST: CBC and BMP in 1 week   Follow-up Information     Paraschos, Alexander, MD. Go in 3 week(s).   Specialty: Cardiology Contact information: [facility] [place] [address] [phone]                Diet recommendation: Cardiac diet  Activity: The patient is advised to gradually reintroduce usual activities, as tolerated  Discharge Condition: stable  Code Status: DNR   History of present illness: As per the H and P dictated on admission  Hospital Course:  [name] is a 81 y.o. female with medical history significant of recent stroke with left-sided weakness, hypertension, hyperlipidemia, asthma, GERD, hypothyroidism, depression with anxiety, seizure, pancreatitis, anemia, dCHF, who presents with worsening left-sided weakness.  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 Per husband, pt got up around 1 AM this morning and went to use the restroom, started feeling worsening left-sided weakness, fainted for few minutes.  Not completely lost consciousness.  Patient also had slurred speech. Also multiple episodes of nonbilious nonbloody vomiting.  12/17:  WBC 11.6, lactic acid 11.9, INR 1.0, PTT 26, troponin level 7, 10, UDS negative, negative urinalysis, alcohol level less than 10, AKI with creatinine 1.79, BUN 22, GFR 29 (recent baseline creatinine 0.9 09/05/2022), temperature normal, blood pressure 145/75, heart rate 102, RR 25, oxygen saturation 99% on room air. 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  Tele neurology consulted, initially recommended IV heparin with no bolus, repeat Echo, later held heparin. Continued vomiting turning to coffee-grounds type material.  CT abd/pelvis noted 8.4 cm mass-like inflammatory/edematous changes in the anterior pelvic fat cephalad to the urinary bladder, possibly contusion if history of regional trauma; neoplasm eg liposarcoma would be a less common etiology. GI consulted, recommend PPI, unlikely GI bleed, cosinder CTA if worsening but no plan for endoscopy at this time. General surgery consulted - opinion that less likely mass more likely contusion, consider MRI pelvis. 12/18: still nauseous. Neurology cleared to restart ASA/Plavix. Hypokalemia this morning to 2.7. Improved to 3.2 12/19: neurology cleared for d/c, cardiology interrogated loop recorder, no events, will continue to record and follow outpatient.     Assessment and Plan:   # Stroke, Occlusion of right vertebral artery SLP eval mild aspiration risk d/t N/V Neurology was consulted, recommended dual antiplatelet  therapy aspirin and Plavix for 21 days and then continue Plavix only. TTE negative for any intracardiac source, patient may benefit from TEE but patient does not want any procedures so neurology  recommended no need but patient will benefit from cardiac monitor as an outpatient to rule out occult A-fib.  Discussed with cardiology, patient already has implantable loop recorder, no arrhythmia noticed. PT/OT recs for inpatient rehab  # AKI (acute kidney injury), Improving/resolved.  Patient was given IV fluid, recommended to continue oral hydration.  Repeat BMP after 1 week. # Hypokalemia, Likely d/t emesis, Repleted and resolved  # Hypomagnesemia, mag repleted.  Resolved # Aspiration pneumonia, s/p Unasyn, followed by oral Augmentin.  Continue symptomatic treatment, no more need of antibiotics. Abdominal pain and Coffee ground emesis, Cleared by GI, no procedures planned.  Continue pantoprazole 40 mg p.o. daily until follow-up with GI as an outpatient.  Patient may benefit from EGD. # Mesenteric mass ruled out. Cleared by general surgery, likely contusion For now, no acute surgical needs. recommend a follow-up CT scan with IV contrast if possible in a few weeks to reevaluate this area. If is still persistent, may be helpful to have an MRI of the pelvis to further evaluate this.  Follow-up with general surgery as an outpatient if needed. # Essential hypertension, Home BP meds held due to AKI and  dehydraition.  Resumed propranolol and amlodipine on discharge, monitor BP and titrate medication accordingly.  Discontinue losartan due to elevated creatinine.  Repeat BMP and monitor BP and titrate medications accordingly. # Acquired hypothyroidism, Continue synthroid # HLD (hyperlipidemia), Continue lipitor  # Asthma, Bronchodilators prn, no exacerbation on my exam. # Chronic diastolic CHF (congestive heart failure) 2D echo on 08/20/2022 showed EF of 55-60% with grade 1 diastolic dysfunction. Patient does not have leg edema JVD. CHF seem to be compensated.  Continue adequate hydration 1.5-2 lit # Normocytic anemia, Hb 8.5, remained stable. # Iron deficiency, iron level 16 and transfer steroid 6%,  started Venofer 500 mg IV daily x 2 doses Folate within normal range.  Continue oral iron supplement. # Depression with anxiety, Continue home meds  Body mass index is 21.55 kg/m.  Nutrition Interventions:   Patient was seen by physical therapy, who recommended Acute inpatient rehab.  On the day of the discharge the patient's vitals were stable, and no other acute medical condition were reported by patient. the patient was felt safe to be discharge at inpatient rehab.  Bed is available today, patient is stable to be transferred today.   Consultants: Neurology, GI, general surgery Procedures: None  Discharge Exam: General: Appear in no distress, no Rash; Oral Mucosa Clear, moist. Cardiovascular: S1 and S2 Present, no Murmur, Respiratory: normal respiratory effort, Bilateral Air entry present and no Crackles, no wheezes Abdomen: Bowel Sound present, Soft and no tenderness, no hernia Extremities: no Pedal edema, no calf tenderness Neurology: alert and oriented to time, place, and person, residual weakness on the left side, left arm is more weak than the left leg. Power 4/5. affect appropriate.  Filed Weights   11/23/22 0145  Weight: 48.4 kg   Vitals:   11/27/22 0453 11/27/22 0758  BP: (!) 144/66 137/76  Pulse: (!) 107 100  Resp: 18 19  Temp: 97.8 F (36.6 C) 97.9 F (36.6 C)  SpO2: 94% 97%    DISCHARGE MEDICATION: Allergies as of 11/27/2022       Reactions   Cinobac [cinoxacin] Anaphylaxis   "My throat swelled and I stopped breathing."  Latex Dermatitis   IgE = 14 (WNL) on 04/17/2022 Pt is highly reactive to any latex products. Blisters skin. "Takes my skin off"   Other Shortness Of Breath   Muscle relaxers? Pt thinks allergic to muscle relaxers, reports stops breathing but not sure what medicine and if for sure muscle relaxer   Petrolatum Distillates [petroleum Distillate] Shortness Of Breath   Passes out    Sulfa Antibiotics Itching   Meloxicam    Other Reaction(s):  Not available, Unknown   Nylon Itching        Medication List     STOP taking these medications    Klor-Con M10 10 MEQ tablet Generic drug: potassium chloride   losartan 50 MG tablet Commonly known as: COZAAR       TAKE these medications    albuterol 108 (90 Base) MCG/ACT inhaler Commonly known as: VENTOLIN HFA Inhale 2 puffs into the lungs every 4 (four) hours as needed for wheezing or shortness of breath.   amLODipine 10 MG tablet Commonly known as: NORVASC TAKE 1 TABLET BY MOUTH EVERY DAY   aspirin EC 81 MG tablet Take 1 tablet (81 mg total) by mouth daily for 18 days. Swallow whole.   atorvastatin 40 MG tablet Commonly known as: LIPITOR TAKE 1 TABLET BY MOUTH EVERY DAY   clopidogrel 75 MG tablet Commonly known as: PLAVIX Take 1 tablet (75 mg total) by mouth daily.   dextromethorphan-guaiFENesin 30-600 MG 12hr tablet Commonly known as: MUCINEX DM Take 1 tablet by mouth 2 (two) times daily as needed for cough.   diclofenac Sodium 1 % Gel Commonly known as: VOLTAREN Apply 2 g topically 3 (three) times daily.   DULoxetine 30 MG capsule Commonly known as: CYMBALTA Take 1 capsule (30 mg total) by mouth daily.   folic acid 440 MCG tablet Commonly known as: FOLVITE Take 1 tablet (800 mcg total) by mouth daily.   gabapentin 300 MG capsule Commonly known as: NEURONTIN Take 1 capsule (300 mg total) by mouth 3 (three) times daily.   iron polysaccharides 150 MG capsule Commonly known as: NIFEREX Take 1 capsule (150 mg total) by mouth daily.   levothyroxine 50 MCG tablet Commonly known as: SYNTHROID Take 1 tablet (50 mcg total) by mouth daily before breakfast.   montelukast 10 MG tablet Commonly known as: SINGULAIR Take 1 tablet (10 mg total) by mouth daily.   Multivitamin Gummies Adult Chew Chew 2 capsules by mouth daily.   pantoprazole 40 MG tablet Commonly known as: PROTONIX TAKE 1 TABLET (40 MG TOTAL) BY MOUTH SEE ADMIN INSTRUCTIONS.    predniSONE 10 MG tablet Commonly known as: DELTASONE Take 1 tablet (10 mg total) by mouth daily.   propranolol ER 60 MG 24 hr capsule Commonly known as: INDERAL LA Take 1 capsule (60 mg total) by mouth daily.       Allergies  Allergen Reactions   Cinobac [Cinoxacin] Anaphylaxis    "My throat swelled and I stopped breathing."   Latex Dermatitis    IgE = 14 (WNL) on 04/17/2022 Pt is highly reactive to any latex products. Blisters skin. "Takes my skin off"   Other Shortness Of Breath    Muscle relaxers? Pt thinks allergic to muscle relaxers, reports stops breathing but not sure what medicine and if for sure muscle relaxer   Petrolatum Distillates [Petroleum Distillate] Shortness Of Breath    Passes out    Sulfa Antibiotics Itching   Meloxicam     Other Reaction(s): Not available, Unknown  Nylon Itching   Discharge Instructions     Call MD for:   Complete by: As directed    Any new neurological symptoms   Call MD for:  extreme fatigue   Complete by: As directed    Call MD for:  persistant dizziness or light-headedness   Complete by: As directed    Call MD for:  persistant nausea and vomiting   Complete by: As directed    Call MD for:  severe uncontrolled pain   Complete by: As directed    Call MD for:  temperature >100.4   Complete by: As directed    Diet - low sodium heart healthy   Complete by: As directed    Discharge instructions   Complete by: As directed    Follow-up with PCP, patient should be seen by an MD, continue to monitor BP and titrate medications accordingly.  Discontinued losartan due to renal failure. Repeat BMP after 1 week to check potassium level and creatinine level and manage accordingly. Follow-up with neurology in 1 week.  Continue dual antiplatelet therapy for total 21 days and then take only Plavix.   Increase activity slowly   Complete by: As directed        The results of significant diagnostics from this hospitalization (including  imaging, microbiology, ancillary and laboratory) are listed below for reference.    Significant Diagnostic Studies: ECHOCARDIOGRAM COMPLETE  Result Date: 11/24/2022    ECHOCARDIOGRAM REPORT   Patient Name:   SHANECE COCHRANE Date of Exam: 11/24/2022 Medical Rec #:  [id number]  Height:       59.0 in Accession #:    [id number] Weight:       106.7 lb Date of Birth:  05/23/1941 BSA:          1.412 m Patient Age:    81 years   BP:           122/45 mmHg Patient Gender: F          HR:           87 bpm. Exam Location:  [facility] Procedure: 2D Echo, Color Doppler and Cardiac Doppler Indications:     I63.9 Stroke  History:         Patient has prior history of Echocardiogram examinations, most                  recent 08/20/2022. Risk Factors:Hypertension and Dyslipidemia.  Sonographer:     Charmayne Sheer Referring Phys:  [id number] Lorenza Chick Diagnosing Phys: Kathlyn Sacramento MD  Sonographer Comments: No subcostal window. IMPRESSIONS  1. Left ventricular ejection fraction, by estimation, is 55 to 60%. The left ventricle has normal function. The left ventricle has no regional wall motion abnormalities. Left ventricular diastolic parameters are consistent with Grade I diastolic dysfunction (impaired relaxation).  2. Right ventricular systolic function is normal. The right ventricular size is normal. Tricuspid regurgitation signal is inadequate for assessing PA pressure.  3. The mitral valve is normal in structure. No evidence of mitral valve regurgitation. No evidence of mitral stenosis.  4. The aortic valve is normal in structure. Aortic valve regurgitation is not visualized. Aortic valve sclerosis is present, with no evidence of aortic valve stenosis.  5. The inferior vena cava is normal in size with greater than 50% respiratory variability, suggesting right atrial pressure of 3 mmHg. FINDINGS  Left Ventricle: Left ventricular ejection fraction, by estimation, is 55 to 60%. The left ventricle has normal function. The left ventricle has  no regional wall motion abnormalities. The left ventricular internal cavity size was normal in size. There is  no left ventricular hypertrophy. Left ventricular diastolic parameters are consistent with Grade I diastolic dysfunction (impaired relaxation). Right Ventricle: The right ventricular size is normal. No increase in right ventricular wall thickness. Right ventricular systolic function is normal. Tricuspid regurgitation signal is inadequate for assessing PA pressure. Left Atrium: Left atrial size was normal in size. Right Atrium: Right atrial size was normal in size. Pericardium: There is no evidence of pericardial effusion. Mitral Valve: The mitral valve is normal in structure. No evidence of mitral valve regurgitation. No evidence of mitral valve stenosis. Tricuspid Valve: The tricuspid valve is normal in structure. Tricuspid valve regurgitation is not demonstrated. No evidence of tricuspid stenosis. Aortic Valve: The aortic valve is normal in structure. Aortic valve regurgitation is not visualized. Aortic valve sclerosis is present, with no evidence of aortic valve stenosis. Aortic valve mean gradient measures 6.0 mmHg. Aortic valve peak gradient measures 11.4 mmHg. Aortic valve area, by VTI measures 1.71 cm. Pulmonic Valve: The pulmonic valve was normal in structure. Pulmonic valve regurgitation is trivial. No evidence of pulmonic stenosis. Aorta: The aortic root is normal in size and structure. Venous: The inferior vena cava is normal in size with greater than 50% respiratory variability, suggesting right atrial pressure of 3 mmHg. IAS/Shunts: No atrial level shunt detected by color flow Doppler.  LEFT VENTRICLE PLAX 2D LVIDd:         3.60 cm   Diastology LVIDs:         2.50 cm   LV e' medial:    8.05 cm/s LV PW:         1.20 cm   LV E/e' medial:  13.5 LV IVS:        0.90 cm   LV e' lateral:   11.70 cm/s LVOT diam:     1.80 cm   LV E/e' lateral: 9.3 LV SV:         57 LV SV Index:   40 LVOT Area:     2.54  cm  RIGHT VENTRICLE RV Basal diam:  2.70 cm RV Mid diam:    3.60 cm RV S prime:     25.00 cm/s TAPSE (M-mode): 3.9 cm LEFT ATRIUM             Index        RIGHT ATRIUM           Index LA diam:        3.70 cm 2.62 cm/m   RA Area:     11.70 cm LA Vol (A2C):   23.4 ml 16.57 ml/m  RA Volume:   25.30 ml  17.92 ml/m LA Vol (A4C):   39.8 ml 28.18 ml/m LA Biplane Vol: 31.9 ml 22.59 ml/m  AORTIC VALVE                     PULMONIC VALVE AV Area (Vmax):    1.75 cm      PV Vmax:       1.27 m/s AV Area (Vmean):   1.68 cm      PV Vmean:      88.900 cm/s AV Area (VTI):     1.71 cm      PV VTI:        0.286 m AV Vmax:           169.00 cm/s   PV Peak grad:  6.5 mmHg  AV Vmean:          115.000 cm/s  PV Mean grad:  4.0 mmHg AV VTI:            0.333 m AV Peak Grad:      11.4 mmHg AV Mean Grad:      6.0 mmHg LVOT Vmax:         116.00 cm/s LVOT Vmean:        75.700 cm/s LVOT VTI:          0.224 m LVOT/AV VTI ratio: 0.67  AORTA Ao Root diam: 2.70 cm MITRAL VALVE MV Area (PHT): 4.49 cm     SHUNTS MV Decel Time: 169 msec     Systemic VTI:  0.22 m MV E velocity: 109.00 cm/s  Systemic Diam: 1.80 cm MV A velocity: 130.00 cm/s MV E/A ratio:  0.84 Kathlyn Sacramento MD Electronically signed by Kathlyn Sacramento MD Signature Date/Time: 11/24/2022/1:17:18 PM    Final    MR CERVICAL SPINE WO CONTRAST  Result Date: 11/23/2022 CLINICAL DATA:  Ataxia. Vertebral artery occlusion. Right MCA territory infarct 3 months ago. EXAM: MRI CERVICAL SPINE WITHOUT CONTRAST TECHNIQUE: Multiplanar, multisequence MR imaging of the cervical spine was performed. No intravenous contrast was administered. COMPARISON:  MRI cervical spine 11/01/2003 FINDINGS: Alignment: 3 mm anterolisthesis is present at C3-4 and C4-5. No other significant listhesis is present. Straightening of the normal cervical lordosis is present. Vertebrae: Mild fatty endplate marrow changes are most evident at C5-6 and C6-7. Marrow signal and vertebral body heights are otherwise normal.  Cord: Linear T2 hyperintensity is present along the right side of the cord at C4-5, likely related to chronic myelomalacia with adjacent disc disease. No other cord signal abnormalities are present. Cord morphology is otherwise within normal limits. Posterior Fossa, vertebral arteries, paraspinal tissues: Prominent soft tissue pannus is present at C1-2. This effaces the ventral CSF. Craniocervical junction is otherwise within normal limits. Flow voids are present within the vertebral arteries bilaterally both within the posterior fossa and along the spinal canal. Disc levels: C2-3: No significant stenosis. C3-4: A rightward disc osteophyte complex effaces the ventral CSF. Severe left and moderate right foraminal stenosis is present. C4-5: A broad-based disc osteophyte complex effaces the ventral CSF. The canal is narrowed 7 mm. Severe left and moderate right foraminal stenosis is present. C5-6: Chronic loss of disc height is present. Broad-based disc osteophyte complex is asymmetric to the right. Partial effacement of the ventral CSF is present. Moderate foraminal stenosis is worse right than left. C6-7: A broad-based disc osteophyte complex is present. This partially effaces the ventral CSF. Moderate foraminal narrowing is worse right than left. C7-T1: Uncovertebral spurring is present bilaterally. Mild bilateral foraminal stenosis is present. IMPRESSION: 1. Multilevel spondylosis of the cervical spine as described. 2. 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 3. 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 4. No other significant cord signal abnormality to suggest ischemic changes to the cord. 5. Severe left and moderate right foraminal stenosis at C3-4 and C4-5. 6. Moderate foraminal stenosis bilaterally at C5-6 and C6-7 is worse on the right. 7. Mild bilateral foraminal narrowing at C7-T1. 8. Prominent soft tissue pannus at  C1-2 effaces the ventral CSF. 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 Electronically Signed   By: San Morelle M.D.   On: 11/23/2022 20:11   MR BRAIN WO CONTRAST  Result Date: 11/23/2022 CLINICAL DATA:  Recent right MCA infarct with left-sided defects 3 months ago. Patient had a syncopal episode today and was assist to the ground. Upon waking she states that she is weaker on the left. She reports headaches, nausea and vomiting. EXAM: MRI HEAD WITHOUT CONTRAST TECHNIQUE: Multiplanar, multiecho pulse sequences of the brain and surrounding structures were obtained without intravenous contrast. COMPARISON:  CT and CTA head and neck 11/23/2022. FINDINGS: Brain: Diffusion-weighted images demonstrate extensive acute nonhemorrhagic infarcts involving the posteroinferior cerebellum bilaterally, right greater than left. 9 mm right occipital pole infarct is present. A punctate white matter infarct is present in the left occipital lobe. Additional punctate cortical infarct is present in the medial right occipital lobe more superiorly a on image 32 of series 5. Expected evolution is present in the previous right posterior frontal lobe white matter infarct. No hemorrhage is present. T2 and FLAIR hyperintensities are associated with the multiple areas of acute infarction. The patient's baseline periventricular and subcortical white matter changes are stable. Remote lacunar infarcts are again noted within the thalami bilaterally. Brainstem is unremarkable. The ventricles are of normal size. No significant extraaxial fluid collection is present. Vascular: Flow is present in the major intracranial arteries. Skull and upper cervical spine: Extensive degenerative change in soft tissue pannus is again noted posterior to the dens. Craniocervical junction is otherwise within normal limits. Sinuses/Orbits: The paranasal sinuses and mastoid air cells are clear. Bilateral lens replacements are noted. Globes and  orbits are otherwise unremarkable. IMPRESSION: 1. 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 2. 9 mm right occipital pole infarct. 3. Punctate white matter infarct in the left occipital lobe. 4. Additional punctate cortical infarct in the medial right occipital lobe more superiorly. 5. Expected evolution of previous right posterior frontal lobe white matter infarct. 6. Stable atrophy and white matter disease likely reflects the sequela of chronic microvascular ischemia. Electronically Signed   By: San Morelle M.D.   On: 11/23/2022 20:03   CT ABDOMEN PELVIS WO CONTRAST  Result Date: 11/23/2022 CLINICAL DATA:  Abdominal pain, acute, nonlocalized EXAM: CT ABDOMEN AND PELVIS WITHOUT CONTRAST TECHNIQUE: Multidetector CT imaging of the abdomen and pelvis was performed following the standard protocol without IV contrast. RADIATION DOSE REDUCTION: This exam was performed according to the departmental dose-optimization program which includes automated exposure control, adjustment of the mA and/or kV according to patient size and/or use of iterative reconstruction technique. COMPARISON:  07/30/2004 by report only FINDINGS: Lower chest: No pleural or pericardial effusion. Small hiatal hernia. Visualized lung bases clear. Hepatobiliary: Hyperdense gallbladder contents probably vicarious excretion of previously administered contrast material. No focal liver lesion or biliary ductal dilatation. Pancreas: Unremarkable. No pancreatic ductal dilatation or surrounding inflammatory changes. Spleen: Normal in size without focal abnormality. Adrenals/Urinary Tract: No adrenal mass. Mildly striated appearance of kidneys suggesting renal dysfunction. No hydronephrosis. Residual contrast material in the distended urinary bladder. Stomach/Bowel: Small hiatal hernia involving gastric fundus. Small bowel decompressed. Appendix not identified. The colon  is incompletely distended by gas and fecal material with scattered diverticula; no adjacent inflammatory/edematous change. Vascular/Lymphatic: Scattered aortoiliac calcified atheromatous plaque without aneurysm. No abdominal or pelvic adenopathy. Reproductive: Post hysterectomy. 4.8 cm simple appearing left lower quadrant cyst abutting distal sigmoid colon, described on previous report of 07/30/2004 measured 4 cm, presumably benign adnexal or duplication cyst given significant stability over time. Other: No ascites.  No free air. Musculoskeletal: Poorly marginated streaky inflammatory/edematous changes in 8.4 cm mass-like anterior pelvic fat cephalad to the urinary  bladder. Mild lumbar dextroscoliosis apex L2 with multilevel spondylitic change. Bilateral hip DJD. IMPRESSION: 1. 8.4 cm mass-like inflammatory/edematous changes in the anterior pelvic fat cephalad to the urinary bladder, possibly contusion if history of regional trauma; neoplasm eg liposarcoma would be a less common etiology. 2. Colonic diverticulosis. 3. Small hiatal hernia. 4.  Aortic Atherosclerosis (ICD10-I70.0). Electronically Signed   By: Lucrezia Europe M.D.   On: 11/23/2022 10:20   CT ANGIO HEAD NECK W WO CM W PERF (CODE STROKE)  Addendum Date: 11/23/2022   ADDENDUM REPORT: 11/23/2022 03:55 ADDENDUM: These results were called by telephone at the time of interpretation on 11/23/2022 at 3:54 am to provider Marry Guan , who verbally acknowledged these results. Electronically Signed   By: Ulyses Jarred M.D.   On: 11/23/2022 03:55   Result Date: 11/23/2022 CLINICAL DATA:  Syncope EXAM: CT ANGIOGRAPHY HEAD AND NECK TECHNIQUE: Multidetector CT imaging of the head and neck was performed using the standard protocol during bolus administration of intravenous contrast. Multiplanar CT image reconstructions and MIPs were obtained to evaluate the vascular anatomy. Carotid stenosis measurements (when applicable) are obtained utilizing NASCET criteria,  using the distal internal carotid diameter as the denominator. RADIATION DOSE REDUCTION: This exam was performed according to the departmental dose-optimization program which includes automated exposure control, adjustment of the mA and/or kV according to patient size and/or use of iterative reconstruction technique. 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 No acute fracture. OTHER NECK: Normal pharynx, larynx and major salivary glands. No cervical lymphadenopathy. Unremarkable thyroid gland. UPPER CHEST: No pneumothorax or pleural effusion. No nodules or masses. AORTIC ARCH: There is no calcific atherosclerosis of the aortic arch. There is no aneurysm, dissection or hemodynamically significant stenosis of the visualized portion of the aorta. Conventional 3 vessel aortic branching pattern. The visualized proximal subclavian arteries are widely patent. RIGHT CAROTID SYSTEM: Normal without aneurysm, dissection or stenosis. LEFT CAROTID SYSTEM: Normal without aneurysm, dissection or stenosis. VERTEBRAL ARTERIES: Left dominant configuration. Both origins are clearly patent. There is enhancement of the right V2 segment which becomes occluded distally and remains occluded to the vertebrobasilar junction. CTA HEAD FINDINGS POSTERIOR CIRCULATION: --Vertebral arteries: Right V4 segment is occluded.  Left is normal. --Inferior cerebellar arteries: Normal. --Basilar artery: Normal. --Superior cerebellar arteries: Normal. --Posterior cerebral arteries (PCA): Normal. ANTERIOR CIRCULATION: --Intracranial internal carotid arteries: Normal. --Anterior cerebral arteries (ACA): Normal. Both A1 segments are present. Patent anterior communicating artery (a-comm). --Middle cerebral arteries (MCA): Normal. VENOUS SINUSES: As permitted by contrast timing, patent. ANATOMIC VARIANTS: None Review of the  MIP images confirms the above findings. IMPRESSION: 1. 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 2. No other intracranial arterial occlusion or high-grade stenosis. 3. 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 Electronically Signed: By: KUlyses JarredM.D. On: 11/23/2022 03:31   DG Chest Port 1 View  Result Date: 11/23/2022 CLINICAL DATA:  Weakness, syncope EXAM: PORTABLE CHEST 1 VIEW COMPARISON:  05/15/2022 FINDINGS: Mild patchy/interstitial opacities, right upper lobe predominant. No pleural effusion or pneumothorax. The heart is normal in size. IMPRESSION: Mild patchy/interstitial opacities, right upper lobe predominant, suspicious for mild pneumonia. Electronically Signed   By: SJulian HyM.D.   On: 11/23/2022 03:23   CT HEAD CODE STROKE WO CONTRAST  Result Date: 11/23/2022 CLINICAL DATA:  Code stroke. EXAM: CT HEAD WITHOUT CONTRAST TECHNIQUE: Contiguous axial  images were obtained from the base of the skull through the vertex without intravenous contrast. RADIATION DOSE REDUCTION: This exam was performed according to the departmental dose-optimization program which includes automated exposure control, adjustment of the mA and/or kV according to patient size and/or use of iterative reconstruction technique. COMPARISON:  None Available. FINDINGS: Brain: There is no mass, hemorrhage or extra-axial collection. The size and configuration of the ventricles and extra-axial CSF spaces are normal. 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 Vascular: No abnormal hyperdensity of the major intracranial arteries or dural venous sinuses. No intracranial atherosclerosis. Skull: The visualized skull base, calvarium and extracranial soft tissues are normal. Sinuses/Orbits: No fluid levels or advanced mucosal thickening of the visualized paranasal sinuses. No mastoid or middle ear effusion. The orbits are normal. ASPECTS [facility]  Stroke Program Early CT Score) - Ganglionic level infarction (caudate, lentiform nuclei, internal capsule, insula, M1-M3 cortex): 7 - Supraganglionic infarction (M4-M6 cortex): 3 Total score (0-10 with 10 being normal): 10 IMPRESSION: 1. No acute intracranial abnormality. 2. 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 3. ASPECTS is 10. These results were called by telephone at the time of interpretation on 11/23/2022 at 2:04 am to provider JADE SUNG , who verbally acknowledged these results. Electronically Signed   By: Ulyses Jarred M.D.   On: 11/23/2022 02:05    Microbiology: Recent Results (from the past 240 hour(s))  Culture, blood (routine x 2)     Status: None (Preliminary result)   Collection Time: 11/23/22  4:12 AM   Specimen: BLOOD RIGHT ARM  Result Value Ref Range Status   Specimen Description BLOOD RIGHT ARM  Final   Special Requests   Final    BOTTLES DRAWN AEROBIC AND ANAEROBIC Blood Culture adequate volume   Culture   Final    NO GROWTH 4 DAYS Performed at [facility], [address] [place], [place] [address]    Report Status PENDING  Incomplete  Culture, blood (routine x 2)     Status: None (Preliminary result)   Collection Time: 11/23/22  4:12 AM   Specimen: BLOOD RIGHT ARM  Result Value Ref Range Status   Specimen Description BLOOD RIGHT ARM  Final   Special Requests   Final    BOTTLES DRAWN AEROBIC AND ANAEROBIC Blood Culture adequate volume   Culture   Final    NO GROWTH 4 DAYS Performed at [facility], [address] [place], [place] [address]    Report Status PENDING  Incomplete     Labs: CBC: Recent Labs  Lab 11/23/22 0204 11/23/22 1902 11/24/22 0241 11/24/22 1013 11/24/22 1715 11/25/22 1554 11/27/22 0554  WBC 11.6*   < > 11.7* 13.3* 12.6* 9.2 12.1*  NEUTROABS 9.1*  --   --   --   --   --   --   HGB 10.5*   < > 8.6* 9.2* 9.3* 8.2* 8.5*  HCT 34.0*   < > 27.2* 29.0* 29.0* 26.0* 27.0*  MCV 92.9   < > 90.4 90.9 90.6 89.7  91.5  PLT 546*   < > 462* 486* 466* 425* 462*   < > = values in this interval not displayed.   Basic Metabolic Panel: Recent Labs  Lab 11/24/22 0241 11/24/22 1715 11/25/22 1554 11/26/22 0412 11/26/22 1803 11/27/22 0554  NA 144  --  141 141 140 143  K 2.7* 3.2* 2.7* 2.7* 3.4* 3.6  CL 111  --  110 113* 111 115*  CO2 24  --  23 23 21* 22  GLUCOSE 76  --  119* 117* 127* 89  BUN 13  --  <5* <5* <5* <5*  CREATININE 1.36*  --  1.07* 1.08* 1.25* 1.28*  CALCIUM 8.8*  --  8.5* 8.3* 8.2* 8.4*  MG  --   --  1.1* 1.1* 2.4 2.1  PHOS  --   --  3.4 3.1  --  3.1   Liver Function Tests: Recent Labs  Lab 11/23/22 0204  AST 34  ALT 24  ALKPHOS 64  BILITOT 0.9  PROT 6.9  ALBUMIN 3.8   Recent Labs  Lab 11/23/22 0412  LIPASE 32   No results for input(s): "AMMONIA" in the last 168 hours. Cardiac Enzymes: No results for input(s): "CKTOTAL", "CKMB", "CKMBINDEX", "TROPONINI" in the last 168 hours. BNP (last 3 results) Recent Labs    11/23/22 0202  BNP 87.7   CBG: Recent Labs  Lab 11/23/22 2148 11/24/22 0756 11/24/22 0910 11/24/22 1009 11/24/22 1135  GLUCAP 119* 67* 66* 158* 80    Time spent: 35 minutes  Signed:  Val Riles  [facility]  11/27/2022 11:29 AM

## 2022-11-27 NOTE — Care Management Important Message (Signed)
Important Message  Patient Details  Name: [name] MRN: [id number] Date of Birth: September 07, 1941   Medicare Important Message Given:  Yes     Juliann Pulse A Quashaun Lazalde 11/27/2022, 12:09 PM

## 2022-11-27 NOTE — Progress Notes (Signed)
Inpatient Rehabilitation Admission Medication Review by a Pharmacist  A complete drug regimen review was completed for this patient to identify any potential clinically significant medication issues.  High Risk Drug Classes Is patient taking? Indication by Medication  Antipsychotic No   Anticoagulant Yes Lovenox-DVT ppx  Antibiotic Yes Augmentin-Asp. PNA  Opioid No   Antiplatelet Yes ASA, plavix- stroke ppx  Hypoglycemics/insulin No   Vasoactive Medication No Propranolol, amlodipine-HTN  Chemotherapy No   Other Yes Albuterol-asthma Atorvastatin-HLD Mucinex DM- cough/cold Folic acid supp Synthroid-hypothyroidism Singulair-asthma Pantoprazole-GERD Cymbalta-depression Gabapentin-neuropathic pain Prednisone-RA Iron-iron supp.     Type of Medication Issue Identified Description of Issue Recommendation(s)  Drug Interaction(s) (clinically significant)     Duplicate Therapy     Allergy     No Medication Administration End Date     Incorrect Dose     Additional Drug Therapy Needed     Significant med changes from prior encounter (inform family/care partners about these prior to discharge). losartan, sertraline Restart PTA meds when and if necessary during CIR admission or at time of discharge, if warranted   Other       Clinically significant medication issues were identified that warrant physician communication and completion of prescribed/recommended actions by midnight of the next day:  No  Name of provider notified for urgent issues identified:   Provider Method of Notification:   Pharmacist comments:   Time spent performing this drug regimen review (minutes):  [place], PharmD. [facility] Acute Care PGY-1 11/27/2022 12:28 PM

## 2022-11-27 NOTE — H&P (Incomplete)
Physical Medicine and Rehabilitation Admission H&P    Chief Complaint  Patient presents with   Weakness  : HPI: [name] is an 81 year old right-handed female with history of recent stroke with left-sided weakness maintained on aspirin and Plavix/loop recorder placement received inpatient rehab services 08/22/2022 - 09/05/2022 discharge to home ambulating 150 feet with a rollator, hypertension, hyperlipidemia, asthma, pancreatitis, chronic anemia, diastolic congestive heart failure.  Per chart review patient lives with spouse.  Two-level home bed and bath main level 2 steps to entry.  Presented to [facility] 11/23/2022 with increasing left-sided weakness nausea vomiting nonspecific abdominal pain.  Cranial CT scan negative for acute changes.  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  New compared to 08/19/2022.  No other intracranial arterial occlusion or high-grade stenosis.  Patient did not receive tPA.  MRI of the brain showed extensive acute nonhemorrhagic infarct involving the posterior inferior cerebellum bilaterally right greater than left.  9 mm right occipital pole infarct.  Punctate white matter infarct in the left occipital lobe.  Additional punctate cortical infarct in the medial right occipital lobe more superiorly.  Echocardiogram with ejection fraction of 55 to 60% no wall motion abnormalities grade 1 diastolic dysfunction.  CT abdomen pelvis showed a 8.4 cm masslike inflammatory/edematous changes in the anterior pelvic fat cephalad to the urinary bladder, possibly contusion versus neoplasm.  Admission chemistries unremarkable except glucose 179 creatinine 1.79, urine drug screen negative, troponin negative, blood cultures no growth to date hemoglobin 10.5, WBC 11,600.  Neurology follow-up initially placed on IV heparin and currently remains on aspirin and Plavix for CVA prophylaxis x 21 days then  Plavix alone.  Lovenox added for DVT prophylaxis.  Gastroenterology services consulted Dr. Virgina Jock in regards to coffee-ground emesis with CT abdomen pelvis reviewed not felt to be GI bleed consider CTA if worsening but no plan for endoscopy or further workup at this time.  Patient was cleared to continue aspirin and Plavix therapy.  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  AKI improved with gentle IV fluids latest creatinine 1.28.  Question aspiration pneumonia initially on Unasyn and transition to Augmentin finishing course.  Tolerating a regular consistency diet.  Therapy evaluations completed due to patient's decreased functional mobility left-sided weakness was admitted for a comprehensive rehab program.  Review of Systems  Constitutional:  Negative for chills and fever.  Respiratory:         Shortness of breath with exertion  Gastrointestinal:  Positive for abdominal pain, nausea and vomiting.       GERD  Musculoskeletal:  Positive for back pain.  Neurological:  Positive for speech change and weakness.  Psychiatric/Behavioral:  Positive for depression.   All other systems reviewed and are negative.  Past Medical History:  Diagnosis Date   Anemia    Arthritis    Asthma    uses inhaler just prior to surgery to avoid attack   Back pain    from previous injury   Complication of anesthesia    has woken  up during 2 different surgery   Depression    no current issue/treatment; situation   Gallstones    GERD (gastroesophageal reflux disease)    Hiatal hernia    patient does NOT have nerve/muscle disease   History of kidney stones    HLD (hyperlipidemia)    HTN (hypertension)  Hypothyroidism    Kidney stones    Knee pain    Non-diabetic pancreatic hormone dysfunction years   pt. 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   Pancreatitis    Pneumonia    Seizures ([place])    caused by dye injected during a procedure   Shortness of breath    with  exertion   Sinus problem    frequent infections/congestion   Stroke [facility]) 2021   reports having CVA in 2021 and having mini strokes before that   Thyroid disease    Past Surgical History:  Procedure Laterality Date   ABDOMINAL HYSTERECTOMY     APPENDECTOMY     CARPAL TUNNEL RELEASE  10+ years ago   bilateral   EYE SURGERY  3 yrs ago   bilateral cataracts   FOOT OSTEOTOMY  6 weeks ago   Left foot: great, 2nd & 3rd   FOOT OSTEOTOMY  5 years ago   Right great toe   HAND SURGERY Bilateral 2011-most recent   multiple hand surgeries, 2 on left, 3 on right   KNEE ARTHROPLASTY Right 04/28/2022   Procedure: COMPUTER ASSISTED TOTAL KNEE ARTHROPLASTY;  Surgeon: Dereck Leep, MD;  Location: [facility] ORS;  Service: Orthopedics;  Laterality: Right;   LOOP RECORDER INSERTION N/A 05/16/2020   Procedure: LOOP RECORDER INSERTION;  Surgeon: Isaias Cowman, MD;  Location: [facility];  Service: Cardiovascular;  Laterality: N/A;   NASAL SINUS SURGERY  most recent 7-8 yrs ago   7 sinus surgeries    TRIGGER FINGER RELEASE  11/19/2011   Procedure: RELEASE TRIGGER FINGER/A-1 PULLEY;  Surgeon: Wynonia Sours, MD;  Location: [place];  Service: Orthopedics;  Laterality: Right;  release a-1 pulley right index finger and cyst removal   WRIST GANGLION EXCISION  1980's   right   Family History  Problem Relation Age of Onset   Anesthesia problems Father        "bad lungs" couldn't wake him up   Heart disease Father    Heart attack Father 49   Breast cancer Maternal Grandmother    Breast cancer Maternal Aunt        x 2   Breast cancer Cousin    Pancreatic cancer Cousin    Heart attack Paternal Uncle    Stroke Paternal Grandfather    Social History:  reports that she has never smoked. She has never used smokeless tobacco. She reports that she does not currently use alcohol. She reports that she does not use drugs. Allergies:  Allergies  Allergen Reactions   Cinobac  [Cinoxacin] Anaphylaxis    "My throat swelled and I stopped breathing."   Latex Dermatitis    IgE = 14 (WNL) on 04/17/2022 Pt is highly reactive to any latex products. Blisters skin. "Takes my skin off"   Other Shortness Of Breath    Muscle relaxers? Pt thinks allergic to muscle relaxers, reports stops breathing but not sure what medicine and if for sure muscle relaxer   Petrolatum Distillates [Petroleum Distillate] Shortness Of Breath    Passes out    Sulfa Antibiotics Itching   Meloxicam     Other Reaction(s): Not available, Unknown   Nylon Itching   Medications Prior to Admission  Medication Sig Dispense Refill   amLODipine (NORVASC) 10 MG tablet TAKE 1 TABLET BY MOUTH EVERY DAY 90 tablet 0   clopidogrel (PLAVIX) 75 MG tablet Take 1 tablet (75 mg total) by mouth daily. 6 tablet 0   DULoxetine (  CYMBALTA) 30 MG capsule Take 1 capsule (30 mg total) by mouth daily. 30 capsule 0   folic acid (FOLVITE) 008 MCG tablet Take 1 tablet (800 mcg total) by mouth daily. 30 tablet 0   gabapentin (NEURONTIN) 300 MG capsule Take 1 capsule (300 mg total) by mouth 3 (three) times daily. 90 capsule 0   levothyroxine (SYNTHROID) 50 MCG tablet Take 1 tablet (50 mcg total) by mouth daily before breakfast. 30 tablet 0   losartan (COZAAR) 50 MG tablet TAKE 1 TABLET BY MOUTH EVERY DAY 90 tablet 0   Multiple Vitamins-Minerals (MULTIVITAMIN GUMMIES ADULT) CHEW Chew 2 capsules by mouth daily.     pantoprazole (PROTONIX) 40 MG tablet TAKE 1 TABLET (40 MG TOTAL) BY MOUTH SEE ADMIN INSTRUCTIONS. 90 tablet 0   potassium chloride (KLOR-CON M10) 10 MEQ tablet TAKE 2 TABLETS BY MOUTH 2 TIMES DAILY. 360 tablet 0   predniSONE (DELTASONE) 10 MG tablet Take 1 tablet (10 mg total) by mouth daily. 30 tablet 0   propranolol ER (INDERAL LA) 60 MG 24 hr capsule Take 1 capsule (60 mg total) by mouth daily. 30 capsule 0   albuterol (PROVENTIL HFA;VENTOLIN HFA) 108 (90 Base) MCG/ACT inhaler Inhale 2 puffs into the lungs every 4  (four) hours as needed for wheezing or shortness of breath.      aspirin EC 81 MG tablet Take 1 tablet (81 mg total) by mouth daily. Swallow whole. (Patient not taking: Reported on 11/23/2022) 100 tablet 0   atorvastatin (LIPITOR) 40 MG tablet TAKE 1 TABLET BY MOUTH EVERY DAY 90 tablet 0   diclofenac Sodium (VOLTAREN) 1 % GEL Apply 2 g topically 3 (three) times daily. 350 g 0   montelukast (SINGULAIR) 10 MG tablet Take 1 tablet (10 mg total) by mouth daily. 30 tablet 0      Home: Home Living Family/patient expects to be discharged to:: Private residence Living Arrangements: Spouse/significant other Available Help at Discharge: Family, Available 24 hours/day Type of Home: House Home Access: Stairs to enter [employer] of Steps: 2 steps then small threshold into house Entrance Stairs-Rails: Right, Left Home Layout: Two level, Able to live on main level with bedroom/bathroom Bathroom Shower/Tub: Multimedia programmer: Handicapped height Bathroom Accessibility: Yes Home Equipment: Toilet riser, BSC/3in1, Conservation officer, nature (2 wheels), Shower seat, Other (comment) Additional Comments: Owns 3WW  Lives With: Spouse   Functional History: Prior Function Prior Level of Function : Independent/Modified Independent Mobility Comments: Per pt she was using occasional 3ww or 4ww or no AD use (per OT eval spouse reporting use of RW). ADLs Comments: Per OT eval "Ind prior to CVA in september. She has most recently needed min A for self care tasks since returning home."  Functional Status:  Mobility: Bed Mobility Overal bed mobility: Needs Assistance Bed Mobility: Supine to Sit Supine to sit: Min assist, Mod assist, HOB elevated Sit to supine: Min assist General bed mobility comments: assist for trunk; vc's for technique Transfers Overall transfer level: Needs assistance Equipment used: Rolling walker (2 wheels) Transfers: Sit to/from Stand Sit to Stand: Min assist Bed  to/from chair/wheelchair/BSC transfer type:: Stand pivot Stand pivot transfers: Min assist General transfer comment: x1 trial standing from bed; vc's for UE placement; assist to steady Ambulation/Gait Ambulation/Gait assistance: Min assist, Mod assist Gait Distance (Feet): 30 Feet Assistive device: Rolling walker (2 wheels) General Gait Details: assist for balance and to maintain upright (pt tending to have forward plank lean); assist and cueing to stay closer to RW  and for wider BOS; decreased stance time L LE Gait velocity: decreased    ADL: ADL Overall ADL's : Needs assistance/impaired  Cognition: Cognition Overall Cognitive Status: Impaired/Different from baseline Orientation Level: Oriented to person, Oriented to place, Oriented to time, Disoriented to situation Cognition Arousal/Alertness: Awake/alert Behavior During Therapy: WFL for tasks assessed/performed Overall Cognitive Status: Impaired/Different from baseline General Comments: Increased time to process but follows 1 step commands.  Physical Exam: Blood pressure 137/76, pulse 100, temperature 97.9 F (36.6 C), temperature source Oral, resp. rate 19, height '4\' 11"'$  (1.499 m), weight 48.4 kg, SpO2 97 %. Physical Exam Neurological:     Comments: Patient is alert.  Provides name and age.  Husband at bedside.     Results for orders placed or performed during the hospital encounter of 11/23/22 (from the past 48 hour(s))  Basic metabolic panel     Status: Abnormal   Collection Time: 11/25/22  3:54 PM  Result Value Ref Range   Sodium 141 135 - 145 mmol/L   Potassium 2.7 (LL) 3.5 - 5.1 mmol/L    Comment: CRITICAL RESULT CALLED TO, READ BACK BY AND VERIFIED WITH  BRANDY MILLER 11/25/22 1634 MU    Chloride 110 98 - 111 mmol/L   CO2 23 22 - 32 mmol/L   Glucose, Bld 119 (H) 70 - 99 mg/dL    Comment: Glucose reference range applies only to samples taken after fasting for at least 8 hours.   BUN <5 (L) 8 - 23 mg/dL    Creatinine, Ser 1.07 (H) 0.44 - 1.00 mg/dL   Calcium 8.5 (L) 8.9 - 10.3 mg/dL   GFR, Estimated 53 (L) >60 mL/min    Comment: (NOTE) Calculated using the CKD-EPI Creatinine Equation (2021)    Anion gap 8 5 - 15    Comment: Performed at [facility], [address] [place], [place] [address]  CBC     Status: Abnormal   Collection Time: 11/25/22  3:54 PM  Result Value Ref Range   WBC 9.2 4.0 - 10.5 K/uL   RBC 2.90 (L) 3.87 - 5.11 MIL/uL   Hemoglobin 8.2 (L) 12.0 - 15.0 g/dL   HCT 26.0 (L) 36.0 - 46.0 %   MCV 89.7 80.0 - 100.0 fL   MCH 28.3 26.0 - 34.0 pg   MCHC 31.5 30.0 - 36.0 g/dL   RDW 14.7 11.5 - 15.5 %   Platelets 425 (H) 150 - 400 K/uL   nRBC 0.0 0.0 - 0.2 %    Comment: Performed at [facility], [address] [place], [place] [address]  Phosphorus     Status: None   Collection Time: 11/25/22  3:54 PM  Result Value Ref Range   Phosphorus 3.4 2.5 - 4.6 mg/dL    Comment: Performed at [facility], [address] [place], [place] [address]  Magnesium     Status: Abnormal   Collection Time: 11/25/22  3:54 PM  Result Value Ref Range   Magnesium 1.1 (L) 1.7 - 2.4 mg/dL    Comment: Performed at [facility], [address] [place], [place] [address]  Iron and TIBC     Status: Abnormal   Collection Time: 11/26/22  4:11 AM  Result Value Ref Range   Iron 16 (L) 28 - 170 ug/dL   TIBC 262 250 - 450 ug/dL   Saturation Ratios 6 (L) 10.4 - 31.8 %   UIBC 246 ug/dL    Comment: Performed at [facility], [address], [place], [place] [address]  Folate     Status: None   Collection Time: 11/26/22  4:11 AM  Result Value Ref Range   Folate 34.0 >5.9 ng/mL    Comment: Performed at [facility], [address] [place], [place] [address]  Basic metabolic panel     Status: Abnormal   Collection Time: 11/26/22  4:12 AM  Result Value Ref Range   Sodium 141 135 - 145 mmol/L   Potassium 2.7 (LL) 3.5 - 5.1 mmol/L    Comment:  CRITICAL RESULT CALLED TO, READ BACK BY AND VERIFIED WITH MARIAM WITHERSPOON 11/26/22 0515 MW    Chloride 113 (H) 98 - 111 mmol/L   CO2 23 22 - 32 mmol/L   Glucose, Bld 117 (H) 70 - 99 mg/dL    Comment: Glucose reference range applies only to samples taken after fasting for at least 8 hours.   BUN <5 (L) 8 - 23 mg/dL   Creatinine, Ser 1.08 (H) 0.44 - 1.00 mg/dL   Calcium 8.3 (L) 8.9 - 10.3 mg/dL   GFR, Estimated 52 (L) >60 mL/min    Comment: (NOTE) Calculated using the CKD-EPI Creatinine Equation (2021)    Anion gap 5 5 - 15    Comment: Performed at [facility], [address], [place], [place] [address]  Magnesium     Status: Abnormal   Collection Time: 11/26/22  4:12 AM  Result Value Ref Range   Magnesium 1.1 (L) 1.7 - 2.4 mg/dL    Comment: Performed at [facility], [address], [place], [place] [address]  Phosphorus     Status: None   Collection Time: 11/26/22  4:12 AM  Result Value Ref Range   Phosphorus 3.1 2.5 - 4.6 mg/dL    Comment: Performed at [facility], [address] [place], [place] [address]  Vitamin B12     Status: Abnormal   Collection Time: 11/26/22 10:32 AM  Result Value Ref Range   Vitamin B-12 1,023 (H) 180 - 914 pg/mL    Comment: (NOTE) This assay is not validated for testing neonatal or myeloproliferative syndrome specimens for Vitamin B12 levels. Performed at [facility], [place] [address], [place], [place] [address]   VITAMIN D 25 Hydroxy (Vit-D Deficiency, Fractures)     Status: None   Collection Time: 11/26/22 10:32 AM  Result Value Ref Range   Vit D, 25-Hydroxy 46.79 30 - 100 ng/mL    Comment: (NOTE) Vitamin D deficiency has been defined by the [place] practice guideline as a level of serum 25-OH  vitamin D less than 20 ng/mL (1,2). The Endocrine Society went on to  further define vitamin D insufficiency as a level between 21 and 29  ng/mL (2).  1. IOM (Institute  of Medicine). 2010. Dietary reference intakes for  calcium and D. [place]: The [employer]. 2. Holick MF, Binkley [place], Bischoff-Ferrari HA, et al. Evaluation,  treatment, and prevention of vitamin D deficiency: an Endocrine  Society clinical practice guideline, JCEM. 2011 Jul; 96(7): 1911-30.  Performed at [facility], [place] [address] [place],  [address]   Magnesium     Status: None   Collection Time: 11/26/22  6:03 PM  Result Value Ref Range   Magnesium 2.4 1.7 - 2.4 mg/dL    Comment: Performed at [facility], [address], [place],  [address]  Basic metabolic panel  Status: Abnormal   Collection Time: 11/26/22  6:03 PM  Result Value Ref Range   Sodium 140 135 - 145 mmol/L   Potassium 3.4 (L) 3.5 - 5.1 mmol/L   Chloride 111 98 - 111 mmol/L   CO2 21 (L) 22 - 32 mmol/L   Glucose, Bld 127 (H) 70 - 99 mg/dL    Comment: Glucose reference range applies only to samples taken after fasting for at least 8 hours.   BUN <5 (L) 8 - 23 mg/dL   Creatinine, Ser 1.25 (H) 0.44 - 1.00 mg/dL   Calcium 8.2 (L) 8.9 - 10.3 mg/dL   GFR, Estimated 44 (L) >60 mL/min    Comment: (NOTE) Calculated using the CKD-EPI Creatinine Equation (2021)    Anion gap 8 5 - 15    Comment: Performed at [facility], [address] [place], [place] [address]  CBC     Status: Abnormal   Collection Time: 11/27/22  5:54 AM  Result Value Ref Range   WBC 12.1 (H) 4.0 - 10.5 K/uL   RBC 2.95 (L) 3.87 - 5.11 MIL/uL   Hemoglobin 8.5 (L) 12.0 - 15.0 g/dL   HCT 27.0 (L) 36.0 - 46.0 %   MCV 91.5 80.0 - 100.0 fL   MCH 28.8 26.0 - 34.0 pg   MCHC 31.5 30.0 - 36.0 g/dL   RDW 14.7 11.5 - 15.5 %   Platelets 462 (H) 150 - 400 K/uL   nRBC 0.0 0.0 - 0.2 %    Comment: Performed at [facility], [address] [place], [place] [address]  Basic metabolic panel     Status: Abnormal   Collection Time: 11/27/22  5:54 AM  Result Value Ref Range   Sodium 143 135  - 145 mmol/L   Potassium 3.6 3.5 - 5.1 mmol/L   Chloride 115 (H) 98 - 111 mmol/L   CO2 22 22 - 32 mmol/L   Glucose, Bld 89 70 - 99 mg/dL    Comment: Glucose reference range applies only to samples taken after fasting for at least 8 hours.   BUN <5 (L) 8 - 23 mg/dL   Creatinine, Ser 1.28 (H) 0.44 - 1.00 mg/dL   Calcium 8.4 (L) 8.9 - 10.3 mg/dL   GFR, Estimated 42 (L) >60 mL/min    Comment: (NOTE) Calculated using the CKD-EPI Creatinine Equation (2021)    Anion gap 6 5 - 15    Comment: Performed at [facility], [address] [place], [place] [address]  Magnesium     Status: None   Collection Time: 11/27/22  5:54 AM  Result Value Ref Range   Magnesium 2.1 1.7 - 2.4 mg/dL    Comment: Performed at [facility], [address], [place], [place] [address]  Phosphorus     Status: None   Collection Time: 11/27/22  5:54 AM  Result Value Ref Range   Phosphorus 3.1 2.5 - 4.6 mg/dL    Comment: Performed at [facility], [address] [place], [place] [address]   No results found.    Blood pressure 137/76, pulse 100, temperature 97.9 F (36.6 C), temperature source Oral, resp. rate 19, height '4\' 11"'$  (1.499 m), weight 48.4 kg, SpO2 97 %.  Medical Problem List and Plan: 1. 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  9 mm right occipital pole infarct.  Punctate white matter infarct left occipital lobe and additional punctate cortical infarct medial right  occipital lobe as well as history of CVA 08/2022 with left-sided weakness.  -patient may *** shower  -ELOS/Goals: *** 2.  Antithrombotics: -DVT/anticoagulation:  Pharmaceutical: Lovenox  -antiplatelet therapy: Aspirin 81 mg daily and Plavix 75 mg daily x 21 days then Plavix alone 3. Pain Management: Neurontin 300 mg 3 times daily.  Tylenol as needed 4. Mood/Behavior/Sleep: Cymbalta 30 mg daily provide emotional  support  -antipsychotic agents: N/A 5. Neuropsych/cognition: This patient is capable of making decisions on her own behalf. 6. Skin/Wound Care: Routine skin checks 7. Fluids/Electrolytes/Nutrition: Routine in and outs with follow-up chemistries 8.  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  Latest creatinine improved 1.28.  Follow-up chemistries 9.  Aspiration pneumonia finishing course of Augmentin 10.  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  Follow-up outpatient as needed. 11.  Hypothyroidism.  Synthroid 12.  Chronic diastolic congestive heart failure.  Monitor for any signs of fluid overload. 13.  Iron deficiency anemia.  Follow-up CBC.  Continue Niferex daily 14.  History of asthma.  Continue inhalers as directed as well as chronic prednisone 10 mg daily 15.  Hyperlipidemia.  Lipitor 16.  Hypertension.  Norvasc 10 mg daily, Inderal 60 mg daily.  Losartan stopped due to AKI.  Monitor with increased mobility  Cathlyn Parsons, PA-C 11/27/2022

## 2022-11-27 NOTE — H&P (Addendum)
Physical Medicine and Rehabilitation Admission H&P    Chief Complaint  Patient presents with   Weakness   : HPI: [name] is an 81 year old right-handed female with history of recent stroke with left-sided weakness maintained on aspirin and Plavix/loop recorder placement received inpatient rehab services 08/22/2022 - 09/05/2022 discharge to home ambulating 150 feet with a rollator, hypertension, hyperlipidemia, asthma, pancreatitis, chronic anemia, diastolic congestive heart failure.  Per chart review patient lives with spouse.  Two-level home bed and bath main level 2 steps to entry.  Presented to [facility] 11/23/2022 with increasing left-sided weakness nausea vomiting nonspecific abdominal pain.  Cranial CT scan negative for acute changes.  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  New compared to 08/19/2022.  No other intracranial arterial occlusion or high-grade stenosis.  Patient did not receive tPA.  MRI of the brain showed extensive acute nonhemorrhagic infarct involving the posterior inferior cerebellum bilaterally right greater than left.  9 mm right occipital pole infarct.  Punctate white matter infarct in the left occipital lobe.  Additional punctate cortical infarct in the medial right occipital lobe more superiorly.  Echocardiogram with ejection fraction of 55 to 60% no wall motion abnormalities grade 1 diastolic dysfunction.  CT abdomen pelvis showed a 8.4 cm masslike inflammatory/edematous changes in the anterior pelvic fat cephalad to the urinary bladder, possibly contusion versus neoplasm.  Admission chemistries unremarkable except glucose 179 creatinine 1.79, urine drug screen negative, troponin negative, blood cultures no growth to date hemoglobin 10.5, WBC 11,600.  Neurology follow-up initially placed on IV heparin and currently remains on aspirin and Plavix for CVA prophylaxis x 21 days  then Plavix alone.  Lovenox added for DVT prophylaxis.  Gastroenterology services consulted Dr. Virgina Jock in regards to coffee-ground emesis with CT abdomen pelvis reviewed not felt to be GI bleed consider CTA if worsening but no plan for endoscopy or further workup at this time.  Patient was cleared to continue aspirin and Plavix therapy.  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  AKI improved with gentle IV fluids latest creatinine 1.28.  Question aspiration pneumonia initially on Unasyn and transition to Augmentin finishing course.  Cardiology interrogated loop recorder and no events noted. Tolerating a regular consistency diet.  Therapy evaluations completed due to patient's decreased functional mobility left-sided weakness was admitted for a comprehensive rehab program.  Review of Systems  Constitutional:  Negative for chills and fever.  HENT:  Negative for congestion.   Eyes:  Negative for double vision.  Respiratory:  Negative for shortness of breath.        Shortness of breath with exertion  Cardiovascular:  Negative for chest pain.  Gastrointestinal:  Positive for abdominal pain, nausea and vomiting.       GERD  Genitourinary: Negative.   Musculoskeletal:  Positive for back pain.  Skin:  Negative for rash.  Neurological:  Positive for speech change and focal weakness. Negative for sensory change and headaches.  Psychiatric/Behavioral:  Positive for depression.   All other systems reviewed and are negative.  Past Medical History:  Diagnosis Date   Anemia    Arthritis    Asthma    uses inhaler just prior to surgery to avoid attack   Back pain    from previous injury   Complication of anesthesia    has woken  up during 2 different surgery  Depression    no current issue/treatment; situation   Gallstones    GERD (gastroesophageal reflux disease)    Hiatal hernia    patient does NOT have nerve/muscle disease   History of kidney stones    HLD  (hyperlipidemia)    HTN (hypertension)    Hypothyroidism    Kidney stones    Knee pain    Non-diabetic pancreatic hormone dysfunction years   pt. 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   Pancreatitis    Pneumonia    Seizures ([place])    caused by dye injected during a procedure   Shortness of breath    with exertion   Sinus problem    frequent infections/congestion   Stroke ([place]) 2021   reports having CVA in 2021 and having mini strokes before that   Thyroid disease    Past Surgical History:  Procedure Laterality Date   ABDOMINAL HYSTERECTOMY     APPENDECTOMY     CARPAL TUNNEL RELEASE  10+ years ago   bilateral   EYE SURGERY  3 yrs ago   bilateral cataracts   FOOT OSTEOTOMY  6 weeks ago   Left foot: great, 2nd & 3rd   FOOT OSTEOTOMY  5 years ago   Right great toe   HAND SURGERY Bilateral 2011-most recent   multiple hand surgeries, 2 on left, 3 on right   KNEE ARTHROPLASTY Right 04/28/2022   Procedure: COMPUTER ASSISTED TOTAL KNEE ARTHROPLASTY;  Surgeon: Dereck Leep, MD;  Location: [facility];  Service: Orthopedics;  Laterality: Right;   LOOP RECORDER INSERTION N/A 05/16/2020   Procedure: LOOP RECORDER INSERTION;  Surgeon: Isaias Cowman, MD;  Location: [facility];  Service: Cardiovascular;  Laterality: N/A;   NASAL SINUS SURGERY  most recent 7-8 yrs ago   7 sinus surgeries    TRIGGER FINGER RELEASE  11/19/2011   Procedure: RELEASE TRIGGER FINGER/A-1 PULLEY;  Surgeon: Wynonia Sours, MD;  Location: [facility];  Service: Orthopedics;  Laterality: Right;  release a-1 pulley right index finger and cyst removal   WRIST GANGLION EXCISION  1980's   right   Family History  Problem Relation Age of Onset   Anesthesia problems Father        "bad lungs" couldn't wake him up   Heart disease Father    Heart attack Father 55   Breast cancer Maternal Grandmother    Breast cancer Maternal Aunt        x 2   Breast cancer Cousin    Pancreatic cancer  Cousin    Heart attack Paternal Uncle    Stroke Paternal Grandfather    Social History:  reports that she has never smoked. She has never used smokeless tobacco. She reports that she does not currently use alcohol. She reports that she does not use drugs. Allergies:  Allergies  Allergen Reactions   Cinobac [Cinoxacin] Anaphylaxis    "My throat swelled and I stopped breathing."   Latex Dermatitis    IgE = 14 (WNL) on 04/17/2022 Pt is highly reactive to any latex products. Blisters skin. "Takes my skin off"   Other Shortness Of Breath    Muscle relaxers? Pt thinks allergic to muscle relaxers, reports stops breathing but not sure what medicine and if for sure muscle relaxer   Petrolatum Distillates [Petroleum Distillate] Shortness Of Breath    Passes out    Sulfa Antibiotics Itching   Meloxicam     Other Reaction(s): Not available, Unknown   Nylon  Itching   Medications Prior to Admission  Medication Sig Dispense Refill   albuterol (PROVENTIL HFA;VENTOLIN HFA) 108 (90 Base) MCG/ACT inhaler Inhale 2 puffs into the lungs every 4 (four) hours as needed for wheezing or shortness of breath.      amLODipine (NORVASC) 10 MG tablet TAKE 1 TABLET BY MOUTH EVERY DAY 90 tablet 0   aspirin EC 81 MG tablet Take 1 tablet (81 mg total) by mouth daily for 18 days. Swallow whole. 18 tablet 0   atorvastatin (LIPITOR) 40 MG tablet TAKE 1 TABLET BY MOUTH EVERY DAY 90 tablet 0   clopidogrel (PLAVIX) 75 MG tablet Take 1 tablet (75 mg total) by mouth daily. 6 tablet 0   dextromethorphan-guaiFENesin (MUCINEX DM) 30-600 MG 12hr tablet Take 1 tablet by mouth 2 (two) times daily as needed for cough.     diclofenac Sodium (VOLTAREN) 1 % GEL Apply 2 g topically 3 (three) times daily. 350 g 0   DULoxetine (CYMBALTA) 30 MG capsule Take 1 capsule (30 mg total) by mouth daily. 30 capsule 0   folic acid (FOLVITE) 440 MCG tablet Take 1 tablet (800 mcg total) by mouth daily. 30 tablet 0   gabapentin (NEURONTIN) 300 MG  capsule Take 1 capsule (300 mg total) by mouth 3 (three) times daily. 90 capsule 0   iron polysaccharides (NIFEREX) 150 MG capsule Take 1 capsule (150 mg total) by mouth daily. 30 capsule 2   levothyroxine (SYNTHROID) 50 MCG tablet Take 1 tablet (50 mcg total) by mouth daily before breakfast. 30 tablet 0   montelukast (SINGULAIR) 10 MG tablet Take 1 tablet (10 mg total) by mouth daily. 30 tablet 0   Multiple Vitamins-Minerals (MULTIVITAMIN GUMMIES ADULT) CHEW Chew 2 capsules by mouth daily.     pantoprazole (PROTONIX) 40 MG tablet TAKE 1 TABLET (40 MG TOTAL) BY MOUTH SEE ADMIN INSTRUCTIONS. 90 tablet 0   predniSONE (DELTASONE) 10 MG tablet Take 1 tablet (10 mg total) by mouth daily. 30 tablet 0   propranolol ER (INDERAL LA) 60 MG 24 hr capsule Take 1 capsule (60 mg total) by mouth daily. 30 capsule 0     Home: Home Living Family/patient expects to be discharged to:: Private residence Living Arrangements: Spouse/significant other Available Help at Discharge: Family, Available 24 hours/day Type of Home: House Home Access: Stairs to enter [employer] of Steps: 2 steps then small threshold into house Entrance Stairs-Rails: Right, Left Home Layout: Two level, Able to live on main level with bedroom/bathroom Bathroom Shower/Tub: Multimedia programmer: Handicapped height Bathroom Accessibility: Yes Home Equipment: Toilet riser, BSC/3in1, Conservation officer, nature (2 wheels), Shower seat, Other (comment) Additional Comments: Owns 3WW  Lives With: Spouse   Functional History: Prior Function Prior Level of Function : Independent/Modified Independent Mobility Comments: Per pt she was using occasional 3ww or 4ww or no AD use (per OT eval spouse reporting use of RW). ADLs Comments: Per OT eval "Ind prior to CVA in september. She has most recently needed min A for self care tasks since returning home."   Functional Status:  Mobility: Bed Mobility Overal bed mobility: Needs  Assistance Bed Mobility: Supine to Sit Supine to sit: Min assist, Mod assist, HOB elevated Sit to supine: Min assist General bed mobility comments: assist for trunk; vc's for technique Transfers Overall transfer level: Needs assistance Equipment used: Rolling walker (2 wheels) Transfers: Sit to/from Stand Sit to Stand: Min assist Bed to/from chair/wheelchair/BSC transfer type:: Stand pivot Stand pivot transfers: Min assist General transfer comment:  x1 trial standing from bed; vc's for UE placement; assist to steady Ambulation/Gait Ambulation/Gait assistance: Min assist, Mod assist Gait Distance (Feet): 30 Feet Assistive device: Rolling walker (2 wheels) General Gait Details: assist for balance and to maintain upright (pt tending to have forward plank lean); assist and cueing to stay closer to RW and for wider BOS; decreased stance time L LE Gait velocity: decreased   ADL: ADL Overall ADL's : Needs assistance/impaired   Cognition: Cognition Overall Cognitive Status: Impaired/Different from baseline Orientation Level: Oriented to person, Oriented to place, Oriented to time, Disoriented to situation Cognition Arousal/Alertness: Awake/alert Behavior During Therapy: WFL for tasks assessed/performed Overall Cognitive Status: Impaired/Different from baseline General Comments: Increased time to process but follows 1 step commands.    Physical Exam: Blood pressure 137/76, pulse 100, temperature 97.9 F (36.6 C), temperature source Oral, resp. rate 19, height '4\' 11"'$  (1.499 m), weight 48.4 kg, SpO2 97 %.    Physical Exam Neurological:     Comments: Patient is alert.  Provides name and age.  Husband at bedside.     General: No apparent distress HEENT: Head is normocephalic, atraumatic, PERRLA, EOMI, sclera anicteric, oral mucosa pink and moist Neck: Supple without JVD or lymphadenopathy Heart: Reg rate and rhythm. No murmurs rubs or gallops Chest: CTA bilaterally without  wheezes, rales, or rhonchi; no distress Abdomen: Soft, non-tender, non-distended, bowel sounds positive. Extremities: No clubbing, cyanosis, left upper extremity edema pulses are 2+ Psych: Pt's affect is appropriate. Pt is cooperative Skin: Clean and intact without signs of breakdown Neuro: Alert and oriented to place, name, month and year and situation.  Follows commands.  Left facial droop.  Hard of hearing does not have hearing aids with her.  Able to name and repeat, speech and language appear to be intact.  Tongue is midline.  Visual fields intact.  EOMI.  PERRL Sensation intact light touch in all 4 extremities Strength 5 out of 5 in right lower extremities and right upper extremity Strength 4+ out of 5 left lower extremity Strength 3 out of 5 proximal left upper extremity to 4- out of 5 distal right upper extremity Finger-nose intact right upper extremity Musculoskeletal: No joint swelling or tenderness noted, no abnormal range of motion noted  Results for orders placed or performed during the hospital encounter of 11/23/22 (from the past 48 hour(s))  Basic metabolic panel     Status: Abnormal   Collection Time: 11/25/22  3:54 PM  Result Value Ref Range   Sodium 141 135 - 145 mmol/L   Potassium 2.7 (LL) 3.5 - 5.1 mmol/L    Comment: CRITICAL RESULT CALLED TO, READ BACK BY AND VERIFIED WITH  BRANDY MILLER 11/25/22 1634 MU    Chloride 110 98 - 111 mmol/L   CO2 23 22 - 32 mmol/L   Glucose, Bld 119 (H) 70 - 99 mg/dL    Comment: Glucose reference range applies only to samples taken after fasting for at least 8 hours.   BUN <5 (L) 8 - 23 mg/dL   Creatinine, Ser 1.07 (H) 0.44 - 1.00 mg/dL   Calcium 8.5 (L) 8.9 - 10.3 mg/dL   GFR, Estimated 53 (L) >60 mL/min    Comment: (NOTE) Calculated using the CKD-EPI Creatinine Equation (2021)    Anion gap 8 5 - 15    Comment: Performed at [facility], [address] [place], [place] [address]  CBC     Status: Abnormal   Collection  Time: 11/25/22  3:54 PM  Result Value Ref Range   WBC 9.2 4.0 - 10.5 K/uL   RBC 2.90 (L) 3.87 - 5.11 MIL/uL   Hemoglobin 8.2 (L) 12.0 - 15.0 g/dL   HCT 26.0 (L) 36.0 - 46.0 %   MCV 89.7 80.0 - 100.0 fL   MCH 28.3 26.0 - 34.0 pg   MCHC 31.5 30.0 - 36.0 g/dL   RDW 14.7 11.5 - 15.5 %   Platelets 425 (H) 150 - 400 K/uL   nRBC 0.0 0.0 - 0.2 %    Comment: Performed at [facility], [address], [place], [place] [address]  Phosphorus     Status: None   Collection Time: 11/25/22  3:54 PM  Result Value Ref Range   Phosphorus 3.4 2.5 - 4.6 mg/dL    Comment: Performed at [facility], [address], [place], [place] [address]  Magnesium     Status: Abnormal   Collection Time: 11/25/22  3:54 PM  Result Value Ref Range   Magnesium 1.1 (L) 1.7 - 2.4 mg/dL    Comment: Performed at [facility], [address] [place], [place] [address]  Iron and TIBC     Status: Abnormal   Collection Time: 11/26/22  4:11 AM  Result Value Ref Range   Iron 16 (L) 28 - 170 ug/dL   TIBC 262 250 - 450 ug/dL   Saturation Ratios 6 (L) 10.4 - 31.8 %   UIBC 246 ug/dL    Comment: Performed at [facility], [address] [place], [place] [address]  Folate     Status: None   Collection Time: 11/26/22  4:11 AM  Result Value Ref Range   Folate 34.0 >5.9 ng/mL    Comment: Performed at [facility], [address] [place], [place] [address]  Basic metabolic panel     Status: Abnormal   Collection Time: 11/26/22  4:12 AM  Result Value Ref Range   Sodium 141 135 - 145 mmol/L   Potassium 2.7 (LL) 3.5 - 5.1 mmol/L    Comment: CRITICAL RESULT CALLED TO, READ BACK BY AND VERIFIED WITH MARIAM WITHERSPOON 11/26/22 0515 MW    Chloride 113 (H) 98 - 111 mmol/L   CO2 23 22 - 32 mmol/L   Glucose, Bld 117 (H) 70 - 99 mg/dL    Comment: Glucose reference range applies only to samples taken after fasting for at least 8 hours.   BUN <5 (L) 8 - 23 mg/dL   Creatinine, Ser 1.08  (H) 0.44 - 1.00 mg/dL   Calcium 8.3 (L) 8.9 - 10.3 mg/dL   GFR, Estimated 52 (L) >60 mL/min    Comment: (NOTE) Calculated using the CKD-EPI Creatinine Equation (2021)    Anion gap 5 5 - 15    Comment: Performed at [facility], [address] [place], [place] [address]  Magnesium     Status: Abnormal   Collection Time: 11/26/22  4:12 AM  Result Value Ref Range   Magnesium 1.1 (L) 1.7 - 2.4 mg/dL    Comment: Performed at [facility], [address] [place], [place] [address]  Phosphorus     Status: None   Collection Time: 11/26/22  4:12 AM  Result Value Ref Range   Phosphorus 3.1 2.5 - 4.6 mg/dL    Comment: Performed at [facility], [address] [place],  [address]  Vitamin B12     Status: Abnormal   Collection Time: 11/26/22 10:32 AM  Result Value Ref  Range   Vitamin B-12 1,023 (H) 180 - 914 pg/mL    Comment: (NOTE) This assay is not validated for testing neonatal or myeloproliferative syndrome specimens for Vitamin B12 levels. Performed at [facility], [place] [address] [place], [place] [address]   VITAMIN D 25 Hydroxy (Vit-D Deficiency, Fractures)     Status: None   Collection Time: 11/26/22 10:32 AM  Result Value Ref Range   Vit D, 25-Hydroxy 46.79 30 - 100 ng/mL    Comment: (NOTE) Vitamin D deficiency has been defined by the [place] practice guideline as a level of serum 25-OH  vitamin D less than 20 ng/mL (1,2). The Endocrine Society went on to  further define vitamin D insufficiency as a level between 21 and 29  ng/mL (2).  1. IOM (Institute of Medicine). 2010. Dietary reference intakes for  calcium and D. [place]: The [employer]. 2. Holick MF, Binkley [place], Bischoff-Ferrari HA, et al. Evaluation,  treatment, and prevention of vitamin D deficiency: an Endocrine  Society clinical practice guideline, JCEM. 2011 Jul; 96(7): 1911-30.  Performed at [facility], [place] [address] [place], [place] [address]   Magnesium     Status: None   Collection Time: 11/26/22  6:03 PM  Result Value Ref Range   Magnesium 2.4 1.7 - 2.4 mg/dL    Comment: Performed at [facility], [address] [place], [place] [address]  Basic metabolic panel     Status: Abnormal   Collection Time: 11/26/22  6:03 PM  Result Value Ref Range   Sodium 140 135 - 145 mmol/L   Potassium 3.4 (L) 3.5 - 5.1 mmol/L   Chloride 111 98 - 111 mmol/L   CO2 21 (L) 22 - 32 mmol/L   Glucose, Bld 127 (H) 70 - 99 mg/dL    Comment: Glucose reference range applies only to samples taken after fasting for at least 8 hours.   BUN <5 (L) 8 - 23 mg/dL   Creatinine, Ser 1.25 (H) 0.44 - 1.00 mg/dL   Calcium 8.2 (L) 8.9 - 10.3 mg/dL   GFR, Estimated 44 (L) >60 mL/min    Comment: (NOTE) Calculated using the CKD-EPI Creatinine Equation (2021)    Anion gap 8 5 - 15    Comment: Performed at [facility], [address] [place], [place] [address]  CBC     Status: Abnormal   Collection Time: 11/27/22  5:54 AM  Result Value Ref Range   WBC 12.1 (H) 4.0 - 10.5 K/uL   RBC 2.95 (L) 3.87 - 5.11 MIL/uL   Hemoglobin 8.5 (L) 12.0 - 15.0 g/dL   HCT 27.0 (L) 36.0 - 46.0 %   MCV 91.5 80.0 - 100.0 fL   MCH 28.8 26.0 - 34.0 pg   MCHC 31.5 30.0 - 36.0 g/dL   RDW 14.7 11.5 - 15.5 %   Platelets 462 (H) 150 - 400 K/uL   nRBC 0.0 0.0 - 0.2 %    Comment: Performed at [facility], [address], [place],  [address]  Basic metabolic panel     Status: Abnormal   Collection Time: 11/27/22  5:54 AM  Result Value Ref Range   Sodium 143 135 - 145 mmol/L   Potassium 3.6 3.5 - 5.1 mmol/L   Chloride 115 (H) 98 - 111 mmol/L   CO2 22 22 - 32 mmol/L   Glucose, Bld 89 70 - 99 mg/dL  Comment: Glucose reference range applies only to samples taken after fasting for at least 8 hours.   BUN <5 (L) 8 - 23 mg/dL   Creatinine, Ser 1.28 (H) 0.44 - 1.00 mg/dL   Calcium 8.4 (L) 8.9 -  10.3 mg/dL   GFR, Estimated 42 (L) >60 mL/min    Comment: (NOTE) Calculated using the CKD-EPI Creatinine Equation (2021)    Anion gap 6 5 - 15    Comment: Performed at [facility], [address] [place], [place] [address]  Magnesium     Status: None   Collection Time: 11/27/22  5:54 AM  Result Value Ref Range   Magnesium 2.1 1.7 - 2.4 mg/dL    Comment: Performed at [facility], [address], [place], [place] [address]  Phosphorus     Status: None   Collection Time: 11/27/22  5:54 AM  Result Value Ref Range   Phosphorus 3.1 2.5 - 4.6 mg/dL    Comment: Performed at [facility], [place] [place], [place] [address]   No results found.    There were no vitals taken for this visit.  Medical Problem List and Plan: 1. 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  9 mm right occipital pole infarct.  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  -patient may  shower  -ELOS/Goals: 10-14 days  -Admit to CIR 2.  Antithrombotics: -DVT/anticoagulation:  Pharmaceutical: Lovenox  -antiplatelet therapy: Aspirin 81 mg daily and Plavix 75 mg daily x 21 days then Plavix alone 3. Pain Management: Neurontin 300 mg 3 times daily at home. Will decrease to 300 BID based on pharmacy recommendation.  Tylenol as needed 4. Mood/Behavior/Sleep: Cymbalta 30 mg daily provide emotional support  -antipsychotic agents: N/A 5. Neuropsych/cognition: This patient is capable of making decisions on her own behalf. 6. Skin/Wound Care: Routine skin checks 7. Fluids/Electrolytes/Nutrition: Routine in and outs with follow-up chemistries 8.  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  Latest creatinine improved 1.28.  Follow-up chemistries 9.  Aspiration pneumonia finishing course of  Augmentin 10.  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  Follow-up outpatient as needed.  Follow-up CT scan with IV contrast if possible in a few weeks.  If still present consider MRI. 11.  Hypothyroidism.  Synthroid 12.  Chronic diastolic congestive heart failure.  Monitor for any signs of fluid overload. Daily weight 13.  Iron deficiency anemia.  Follow-up CBC.  Continue Niferex daily 14.  History of asthma.  Continue inhalers as directed as well as chronic prednisone 10 mg daily 15.  Hyperlipidemia.  Lipitor 40 mg 16.  Hypertension.  Will hold off on restarting Norvasc 10 mg daily, Inderal 60 mg daily. Consider restarting if needed.  Losartan stopped due to AKI.  Well-controlled overall.  Monitor with increased  17.  Abdominal pain and coffee-ground emesis.  Evaluated by GI no procedures recommended.  Continue pantoprazole and follow-up with GI outpatient.  May need outpatient EGD. 18.  Hypomagnesemia.  Recheck level 19.  Hypokalemia.  Recheck level 20. Constipation   -Start miralax BID   Cathlyn Parsons, PA-C 11/27/2022  I have personally performed a face to face diagnostic evaluation of this patient and formulated the key components of the plan.  Additionally, I have personally reviewed laboratory data, imaging studies, as well as relevant notes and concur  with the physician assistant's documentation above.  The patient's status has not changed from the original H&P.  Any changes in documentation from the acute care chart have been noted above.  Jennye Boroughs, MD, [facility]rown

## 2022-11-27 NOTE — Consult Note (Signed)
PHARMACY CONSULT NOTE - FOLLOW UP  Pharmacy Consult for Electrolyte Monitoring and Replacement   Recent Labs: Potassium (mmol/L)  Date Value  11/27/2022 3.6   Magnesium (mg/dL)  Date Value  11/27/2022 2.1   Calcium (mg/dL)  Date Value  11/27/2022 8.4 (L)   Albumin (g/dL)  Date Value  11/23/2022 3.8   Phosphorus (mg/dL)  Date Value  11/27/2022 3.1   Sodium (mmol/L)  Date Value  11/27/2022 143    Assessment: 81 yo F with PMH HTN, HLD, hypothyroidism, seizure, HFpEF presents with CVA and now with aspiration pneumonia. Hypokalemic likely d/t emesis and pharmacy has been consulted to help manage electrolytes.  Normal Saline-KCl 20 meq/L @ 75 mL/hr. Scr increasing from 1.08 >> 1.25 >> 1.28  Goal of Therapy:  Electrolytes WNL   Plan:  Continue Kcl 20 mEq/L to IVF. Follow up K+ and Mag in PM. CTM renal function   Glean Salvo, PharmD, BCPS Clinical Pharmacist  11/27/2022 7:43 AM

## 2022-11-27 NOTE — TOC Transition Note (Signed)
Transition of Care [facility]) - CM/SW Discharge Note   Patient Details  Name: [name] MRN: [id number] Date of Birth: 1941/12/08  Transition of Care [facility]) CM/SW Contact:  Quin Hoop, LCSW Phone Number: 11/27/2022, 12:52 PM   Clinical Narrative:     Patient being transferred to CIR via [facility].  Transport paperwork in patient chart, along with facesheet.   Final next level of care: IP Rehab Facility Barriers to Discharge:  (pt going to IP rehab)   Patient Goals and CMS Choice          Discharge Placement                Patient to be transferred to facility by: [facility]   Patient and family notified of of transfer: 11/27/22  Discharge Plan and Services                                     Social Determinants of Health (SDOH) Interventions     Readmission Risk Interventions     No data to display

## 2022-11-27 NOTE — Progress Notes (Signed)
Report Called to [facility] RN at [facility].   IV will remain in place for transport. All question answered at time of call.

## 2022-11-27 NOTE — Progress Notes (Signed)
Speech Language Pathology Treatment:    Patient Details Name: NAHIA NISSAN MRN: [id number] DOB: May 15, 1941 Today's Date: 11/27/2022 Time: 2751-7001 SLP Time Calculation (min) (ACUTE ONLY): 12 min  Assessment / Plan / Recommendation Clinical Impression  Pt seen for speech/language tx. Pt alert, pleasant, and cooperative. Eager to d/c to CIR shortly. Husband at bedside. Both note that pt to d/c to CIR today ~1300-1330.  During informal conversational exchanges, pt's speech judged to be fluent, appropriate, and without s/sx anomia or dysarthria. Pt and husband noted marked improvement in pt's communication since admission and reports a near complete resolution of speech/language deficits. Pt appeared to follow complex conversation without difficulty this date. Pt would benefit from cognitive-linguistic re-evaluation next visit or at next level of care.  Reviewed principles of progress to date, SLP POC, changes to cognitive-linguistic functioning following stroke, principles of neuroplasticity, rationale for CIR, and general CIR expectations with pt and husband. Both verbalized understanding/agreement.   SLP to f/u per POC for cognitive-linguistic evaluation next visit should pt not d/c to CIR this date.    HPI HPI: Per H&P "Maddi C Bacallao is a 81 y.o. female with medical history significant of recent stroke with left-sided weakness, hypertension, hyperlipidemia, asthma, GERD, hypothyroidism, depression with anxiety, seizure, pancreatitis, anemia, dCHF, who presents with worsening left-sided weakness.     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 Per husband, pt got up around 1 AM this morning and went to use the restroom, started feeling worsening left-sided weakness, fainted for few minutes.  Not completely lost consciousness.  Patient also had slurred speech.  No vision loss or hearing loss. Patient does not have chest pain, fever or chills.  Patient has multiple  episodes of nonbilious nonbloody vomiting.  No diarrhea.  Husband states that the patient has intermittent abdominal pain in the past few weeks, which is located in the central abdomen, mild to moderate, sharp, nonradiating.  After vomiting, patient started coughing with little mucus production.  No symptoms of UTI.  Denies dark stool or rectal bleeding.     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  Tele neurology was consulted, recommended IV heparin with no bolus."      SLP Plan  Continue with current plan of care      Recommendations for follow up therapy are one component of a multi-disciplinary discharge planning process, led by the attending physician.  Recommendations may be updated based on patient status, additional functional criteria and insurance authorization.    Recommendations         Follow Up Recommendations: Acute inpatient rehab (3hours/day) Assistance recommended at discharge: Frequent or constant Supervision/Assistance SLP Visit Diagnosis: Cognitive communication deficit (V49.449) Plan: Continue with current plan of care          Cherrie Gauze, M.S., [facility] [phone] ([place])  Quintella Baton  11/27/2022, 1:12 PM

## 2022-11-27 NOTE — Discharge Instructions (Addendum)
Inpatient Rehab Discharge Instructions  [name] Discharge date and time: No discharge date for patient encounter.   Activities/Precautions/ Functional Status: Activity: activity as tolerated Diet: regular diet Wound Care: Routine skin checks Functional status:  ___ No restrictions     ___ Walk up steps independently ___ 24/7 supervision/assistance   ___ Walk up steps with assistance ___ Intermittent supervision/assistance  ___ Bathe/dress independently ___ Walk with walker     _x__ Bathe/dress with assistance ___ Walk Independently    ___ Shower independently ___ Walk with assistance    ___ Shower with assistance ___ No alcohol     ___ Return to work/school ________  COMMUNITY REFERRALS UPON DISCHARGE:     Outpatient: PT     OT    ST                Agency: [facility]  Phone: [phone]              Appointment Date/Time: Please allow 3-7 Days for scheduling to reach out.   Special Instructions: No driving smoking or alcohol  Continue  aspirin 81 mg daily and Plavix 75 mg daily until 12/14/2022 and then Plavix alone    STROKE/TIA DISCHARGE INSTRUCTIONS SMOKING Cigarette smoking nearly doubles your risk of having a stroke & is the single most alterable risk factor  If you smoke or have smoked in the last 12 months, you are advised to quit smoking for your health. Most of the excess cardiovascular risk related to smoking disappears within a year of stopping. Ask you doctor about anti-smoking medications [place] Quit Line: 1-800-QUIT NOW Free Smoking Cessation Classes [phone]  CHOLESTEROL Know your levels; limit fat & cholesterol in your diet  Lipid Panel     Component Value Date/Time   CHOL 138 11/24/2022 0241   TRIG 193 (H) 11/24/2022 0241   HDL 51 11/24/2022 0241   CHOLHDL 2.7 11/24/2022 0241   VLDL 39 11/24/2022 0241   LDLCALC 48 11/24/2022 0241     Many patients benefit from treatment even if their cholesterol is at goal. Goal: Total Cholesterol (CHOL)  less than 160 Goal:  Triglycerides (TRIG) less than 150 Goal:  HDL greater than 40 Goal:  LDL (LDLCALC) less than 100   BLOOD PRESSURE American Stroke Association blood pressure target is less that 120/80 mm/Hg  Your discharge blood pressure is:    Monitor your blood pressure Limit your salt and alcohol intake Many individuals will require more than one medication for high blood pressure  DIABETES (A1c is a blood sugar average for last 3 months) Goal HGBA1c is under 7% (HBGA1c is blood sugar average for last 3 months)  Diabetes: No known diagnosis of diabetes    Lab Results  Component Value Date   HGBA1C 6.1 (H) 11/23/2022    Your HGBA1c can be lowered with medications, healthy diet, and exercise. Check your blood sugar as directed by your physician Call your physician if you experience unexplained or low blood sugars.  PHYSICAL ACTIVITY/REHABILITATION Goal is 30 minutes at least 4 days per week  Activity: Increase activity slowly, Therapies: Physical Therapy: Home Health Return to work:  Activity decreases your risk of heart attack and stroke and makes your heart stronger.  It helps control your weight and blood pressure; helps you relax and can improve your mood. Participate in a regular exercise program. Talk with your doctor about the best form of exercise for you (dancing, walking, swimming, cycling).  DIET/WEIGHT Goal is to maintain a  healthy weight  Your discharge diet is:  Diet Order     None       liquids Your height is:    Your current weight is:   Your Body Mass Index (BMI) is:    Following the type of diet specifically designed for you will help prevent another stroke. Your goal weight range is:   Your goal Body Mass Index (BMI) is 19-24. Healthy food habits can help reduce 3 risk factors for stroke:  High cholesterol, hypertension, and excess weight.  RESOURCES Stroke/Support Group:  Call [phone]   STROKE EDUCATION PROVIDED/REVIEWED AND GIVEN TO PATIENT  Stroke warning signs and symptoms How to activate emergency medical system (call 911). Medications prescribed at discharge. Need for follow-up after discharge. Personal risk factors for stroke. Pneumonia vaccine given: No Flu vaccine given: No My questions have been answered, the writing is legible, and I understand these instructions.  I will adhere to these goals & educational materials that have been provided to me after my discharge from the hospital.      My questions have been answered and I understand these instructions. I will adhere to these goals and the provided educational materials after my discharge from the hospital.  Patient/Caregiver Signature _______________________________ Date __________  Clinician Signature _______________________________________ Date __________  Please bring this form and your medication list with you to all your follow-up doctor's appointments.

## 2022-11-27 NOTE — Progress Notes (Signed)
Jennye Boroughs, MD  Physician Physical Medicine and Rehabilitation   PMR Pre-admission     Signed   Date of Service: 11/26/2022  1:31 PM  Related encounter: ED to Hosp-Admission (Discharged) from 11/23/2022 in [place]   Signed      Show:Clear all '[x]'$ Written'[x]'$ Templated'[]'$ Copied  Added by: '[x]'$ Darnesha Diloreto, Vertis Kelch, RN'[x]'$ Jennye Boroughs, MD  '[]'$ Hover for details PMR Admission Coordinator Pre-Admission Assessment   Patient: [name] is an 81 y.o., female MRN: [id number] DOB: [birth year] Height: '4\' 11"'$  (149.9 cm) Weight: 48.4 kg   Insurance Information HMO: yes    PPO:      PCP:      IPA:      80/20:      OTHER:  PRIMARY: [employer]      Policy#: [id number]      Subscriber: pt CM Name: Myriam Jacobson      Phone#: [phone] ext [phone]    Fax#: [phone] Pre-Cert#: [id number] f/u with lorraine phone [phone] ext [phone] fax [phone]      Employer:  Benefits:  Phone #: [phone]     Name: 11/26/22 Eff. Date: 12/08/21     Deduct: none      Out of Pocket Max: $3400      Life Max: none CIR: $295 co pay per day days 1 until 6      SNF: no c copay per day days 1 until 20, $196 co pay per day days 21 until 100 Outpatient: $10 to $20 per visit     Co-Pay: visit per medical neccesity Home Health: 100%      Co-Pay: visit per medical neccesity DME: 80%     Co-Pay: 20% Providers: in network  SECONDARY: none   Financial Counselor:       Phone#:    [employer] Engineer[employer]" for patients in Inpatient Rehabilitation Facilities with attached "Privacy Act [place] Records" was provided and verbally reviewed with: Patient and Family   Emergency Contact Information Contact Information       Name Relation Home Work Mobile    [name],[name] Spouse Marston Son     [phone]         Current Medical History  Patient Admitting Diagnosis: CVA   History of Present Illness: 81  year old female with 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 Also noted nausea and vomiting with coffee ground emesis.    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 Neurology consulted and initially recommended IV heparin with no bolus,  but later held Heparin. MRI with extensive acute non hemorrhagic infarcts involving the posteroinferior cerebellum bilaterally, right greatest than left. 9 mm right occipital pole infarct. Punctuate white matter infarct in the left occipital lobe, additional punctuate cortical infarct in the medial right occipital lobe more superiorly.  With continued coffee ground emesis, CT abd and pelvis noted 8.4 cm mass like inflammatory/edematous changes in the anterior pelvic flat cephalad to the urinary bladder, possible contusion if history of regional trauma. GI consulted and recommended PPI, unlikely GI Bleed and to consider CTA if worsening but no plan for EGD at this time. General surgery consulted, and recommended that less likely mass more like contusion and to consider MRI pelvis. Neurology cleared to restart ASA and Plavix for  21 days followed by plavix for monotherapy.  Cardiology interrogated loop recorder, found no events and to follow up as an outpatient. Initial AKI resolved with IV fluids, hypokalemia due to nausea and to monitor BMP. Plan to replete as needed. Given Unasyn for aspiration pneumonia with prn albuterol an mucinex.    Patient's medical record from [facility] has been reviewed by the rehabilitation admission coordinator and physician.   Past Medical History      Past Medical History:  Diagnosis Date   Anemia     Arthritis     Asthma      uses inhaler just prior to surgery to avoid attack   Back pain      from previous injury   Complication of anesthesia       has woken  up during 2 different surgery   Depression      no current issue/treatment; situation   Gallstones     GERD (gastroesophageal reflux disease)     Hiatal hernia      patient does NOT have nerve/muscle disease   History of kidney stones     HLD (hyperlipidemia)     HTN (hypertension)     Hypothyroidism     Kidney stones     Knee pain     Non-diabetic pancreatic hormone dysfunction years    pt. 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   Pancreatitis     Pneumonia     Seizures (HCC)      caused by dye injected during a procedure   Shortness of breath      with exertion   Sinus problem      frequent infections/congestion   Stroke ([place]) 2021    reports having CVA in 2021 and having mini strokes before that   Thyroid disease      Has the patient had major surgery during 100 days prior to admission? No   Family History   family history includes Anesthesia problems in her father; Breast cancer in her cousin, maternal aunt, and maternal grandmother; Heart attack in her paternal uncle; Heart attack (age of onset: 27) in her father; Heart disease in her father; Pancreatic cancer in her cousin; Stroke in her paternal grandfather.   Current Medications   Current Facility-Administered Medications:    0.9 % NaCl with KCl 20 mEq/ L  infusion, , Intravenous, Continuous, Wynelle Cleveland, RPH, Last Rate: 75 mL/hr at 11/26/22 0903, New Bag at 11/26/22 6789   acetaminophen (TYLENOL) tablet 650 mg, 650 mg, Oral, Q4H PRN **OR** acetaminophen (TYLENOL) 160 MG/5ML solution 650 mg, 650 mg, Per Tube, Q4H PRN **OR** acetaminophen (TYLENOL) suppository 650 mg, 650 mg, Rectal, Q4H PRN, Ivor Costa, MD   albuterol (PROVENTIL) (2.5 MG/3ML) 0.083% nebulizer solution 2.5 mg, 2.5 mg, Nebulization, Q4H PRN, Ivor Costa, MD   amoxicillin-clavulanate (AUGMENTIN) 500-125 MG per tablet 1 tablet, 1 tablet, Oral, Q12H, Wynelle Cleveland, RPH, 1 tablet at 11/26/22 3810   aspirin EC tablet 81 mg, 81 mg,  Oral, Daily, Emeterio Reeve, DO, 81 mg at 11/26/22 0859   atorvastatin (LIPITOR) tablet 40 mg, 40 mg, Oral, Daily, Ivor Costa, MD, 40 mg at 11/26/22 1751   clopidogrel (PLAVIX) tablet 75 mg, 75 mg, Oral, Daily, Emeterio Reeve, DO, 75 mg at 11/26/22 0258   dextromethorphan-guaiFENesin (Dillon DM) 30-600 MG per 12 hr tablet 1 tablet, 1 tablet, Oral, BID PRN, Ivor Costa, MD   folic acid (FOLVITE) tablet 1 mg, 1 mg, Oral, Daily, Ivor Costa, MD, 1 mg  at 11/26/22 0859   hydrALAZINE (APRESOLINE) injection 5 mg, 5 mg, Intravenous, Q2H PRN, Ivor Costa, MD   levothyroxine (SYNTHROID) tablet 50 mcg, 50 mcg, Oral, QAC breakfast, Ivor Costa, MD, 50 mcg at 11/26/22 0536   montelukast (SINGULAIR) tablet 10 mg, 10 mg, Oral, QPM, Ivor Costa, MD   morphine (PF) 2 MG/ML injection 0.5 mg, 0.5 mg, Intravenous, Q3H PRN, Ivor Costa, MD   multivitamin with minerals tablet 1 tablet, 1 tablet, Oral, Daily, Ivor Costa, MD   ondansetron [facility]) injection 4 mg, 4 mg, Intravenous, Q8H PRN, Ivor Costa, MD, 4 mg at 11/25/22 0851   pantoprazole (PROTONIX) injection 40 mg, 40 mg, Intravenous, Q12H, Annamaria Helling, DO, 40 mg at 11/26/22 0240   promethazine (PHENERGAN) 12.5 mg in sodium chloride 0.9 % 50 mL IVPB, 12.5 mg, Intravenous, Q8H PRN, Ivor Costa, MD   senna-docusate (Senokot-S) tablet 1 tablet, 1 tablet, Oral, QHS PRN, Ivor Costa, MD   Patients Current Diet:  Diet Order                  Diet Heart Room service appropriate? Yes; Fluid consistency: Thin  Diet effective now                       Precautions / Restrictions Precautions Precautions: Fall Precaution Comments: Aspiration Restrictions Weight Bearing Restrictions: No    Has the patient had 2 or more falls or a fall with injury in the past year? No   Prior Activity Level Limited Community (1-2x/wk): Mod I with RW until 2 weeks ago having the flu became weaker   Prior Functional Level Self Care: Did the patient need help bathing,  dressing, using the toilet or eating? Needed some help   Indoor Mobility: Did the patient need assistance with walking from room to room (with or without device)? Independent   Stairs: Did the patient need assistance with internal or external stairs (with or without device)? Needed some help   Functional Cognition: Did the patient need help planning regular tasks such as shopping or remembering to take medications? Needed some help   Patient Information Are you of Hispanic, Latino/a,or Spanish origin?: A. No, not of Hispanic, Latino/a, or Spanish origin What is your race?: A. White Do you need or want an interpreter to communicate with a doctor or health care staff?: 0. No   Patient's Response To:  Health Literacy and Transportation Is the patient able to respond to health literacy and transportation needs?: Yes Health Literacy - How often do you need to have someone help you when you read instructions, pamphlets, or other written material from your doctor or pharmacy?: Never In the past 12 months, has lack of transportation kept you from medical appointments or from getting medications?: No In the past 12 months, has lack of transportation kept you from meetings, work, or from getting things needed for daily living?: No   Home Assistive Devices / [place] Devices/Equipment: Environmental consultant (specify type) Home Equipment: Toilet riser, BSC/3in1, Rolling Walker (2 wheels), Shower seat, Other (comment)   Prior Device Use: Indicate devices/aids used by the patient prior to current illness, exacerbation or injury? Walker   Current Functional Level Cognition   Overall Cognitive Status: Impaired/Different from baseline Orientation Level: Oriented to person, Oriented to place, Disoriented to time, Disoriented to situation General Comments: Increased time to process but follows 1 step commands.    Extremity Assessment (includes Sensation/Coordination)   Upper Extremity Assessment:  Defer to OT  evaluation LUE Deficits / Details: per OT eval "decreased shoulder elevation and increased tone noted from prior CVA but pt reports more weakness than before."  Lower Extremity Assessment: Generalized weakness (able to perform B LE SLR independently; at least 3/5 AROM hip flexion, knee flexion/extension, and DF/PF; intacte B LE light touch, tone, and proprioception)     ADLs   Overall ADL's : Needs assistance/impaired     Mobility   Overal bed mobility: Needs Assistance Bed Mobility: Supine to Sit Supine to sit: Mod assist, HOB elevated Sit to supine: Min assist General bed mobility comments: Deferred (pt sitting in recliner beginning/end of session)     Transfers   Overall transfer level: Needs assistance Equipment used: Rolling walker (2 wheels) Transfers: Sit to/from Stand Sit to Stand: Min assist, Mod assist Bed to/from chair/wheelchair/BSC transfer type:: Stand pivot Stand pivot transfers: Min assist General transfer comment: x2 trials standing from recliner; assist to steady and for safety/balance (pt tending to shift weight forward)     Ambulation / Gait / Stairs / Wheelchair Mobility   Ambulation/Gait Ambulation/Gait assistance: Min assist, Mod assist Gait Distance (Feet): 15 Feet Assistive device: Rolling walker (2 wheels) General Gait Details: assist for balance and to maintain upright (pt tending to have forward lean); assist and cueing to stay closer to RW; decreased stance time L LE Gait velocity: decreased     Posture / Balance Dynamic Sitting Balance Sitting balance - Comments: steady sitting reaching within BOS Balance Overall balance assessment: Needs assistance Sitting-balance support: Feet supported, No upper extremity supported Sitting balance-Leahy Scale: Good Sitting balance - Comments: steady sitting reaching within BOS Standing balance support: During functional activity, Bilateral upper extremity supported, Reliant on assistive device for  balance Standing balance-Leahy Scale: Poor Standing balance comment: assist for balance with standing activities     Special needs/care consideration Fall precautions    Previous Home Environment  Living Arrangements: Spouse/significant other  Lives With: Spouse Available Help at Discharge: Family, Available 24 hours/day Type of Home: House Home Layout: Two level, Able to live on main level with bedroom/bathroom Home Access: Stairs to enter Entrance Stairs-Rails: Right, Left Entrance Stairs-Number of Steps: 2 steps then small threshold into house Bathroom Shower/Tub: Multimedia programmer: Handicapped height Bathroom Accessibility: Yes How Accessible: Accessible via walker Home Care Services: No (going to outpatient therapy since Sept CVA) Additional Comments: Owns 3WW   Discharge Living Setting Plans for Discharge Living Setting: Patient's home, Lives with (comment) (spouse) Type of Home at Discharge: House Discharge Home Layout: Two level, Able to live on main level with bedroom/bathroom Discharge Home Access: Stairs to enter Entrance Stairs-Rails: Right, Left, Can reach both Entrance Stairs-Number of Steps: 2 steps then small threshold into home Discharge Bathroom Shower/Tub: Walk-in shower Discharge Bathroom Toilet: Handicapped height Discharge Bathroom Accessibility: Yes How Accessible: Accessible via walker Does the patient have any problems obtaining your medications?: No   Social/Family/Support Systems Patient Roles: Spouse Contact Information: spouse, [name] Anticipated Caregiver: spouse Anticipated Ambulance person Information: see contacts Ability/Limitations of Caregiver: none Caregiver Availability: 24/7 Discharge Plan Discussed with Primary Caregiver: Yes Is Caregiver In Agreement with Plan?: Yes Does Caregiver/Family have Issues with Lodging/Transportation while Pt is in Rehab?: No   Goals Patient/Family Goal for Rehab: supervision with PT, OT,  and SLP Expected length of stay: ELOS 10 to 14 days Additional Information: Recent CIr admit Sept 2023, Dr Tressa Busman Pt/Family Agrees to Admission and willing to participate: Yes Program Orientation Provided & Reviewed with Pt/Caregiver Including Roles  &  Responsibilities: Yes   Decrease burden of Care through IP rehab admission: n/a   Possible need for SNF placement upon discharge: not anticipated   Patient Condition: I have reviewed medical records from [facility], spoken with  patient and spouse. I discussed via phone for inpatient rehabilitation assessment.  Patient will benefit from ongoing PT, OT, and SLP, can actively participate in 3 hours of therapy a day 5 days of the week, and can make measurable gains during the admission.  Patient will also benefit from the coordinated team approach during an Inpatient Acute Rehabilitation admission.  The patient will receive intensive therapy as well as Rehabilitation physician, nursing, social worker, and care management interventions.  Due to bladder management, bowel management, safety, skin/wound care, disease management, medication administration, pain management, and patient education the patient requires 24 hour a day rehabilitation nursing.  The patient is currently mod assist overall with mobility and basic ADLs.  Discharge setting and therapy post discharge at home with outpatient is anticipated.  Patient has agreed to participate in the Acute Inpatient Rehabilitation Program and will admit today.   Preadmission Screen Completed By:  Cleatrice Burke, 11/26/2022 1:31 PM ______________________________________________________________________   Discussed status with Dr. Curlene Dolphin on 11/27/22 at 1121 and received approval for admission today.   Admission Coordinator:  Cleatrice Burke, RN, time 0240 Date 11/27/22    Assessment/Plan: Diagnosis: CVA Does the need for close, 24 hr/day Medical supervision in concert with the patient's rehab  needs make it unreasonable for this patient to be served in a less intensive setting? Yes Co-Morbidities requiring supervision/potential complications: AKI, Hypokalemia, hypomagnesia, aspiration PNA, HTN, HLD, Asthma, CHF, Anemia, depression           Due to bladder management, bowel management, safety, skin/wound care, disease management, medication administration, pain management, and patient education, does the patient require 24 hr/day rehab nursing? Yes Does the patient require coordinated care of a physician, rehab nurse, PT, OT, and SLP to address physical and functional deficits in the context of the above medical diagnosis(es)? Yes Addressing deficits in the following areas: balance, endurance, locomotion, strength, transferring, bowel/bladder control, bathing, dressing, feeding, grooming, toileting, cognition, speech, language, swallowing, and psychosocial support Can the patient actively participate in an intensive therapy program of at least 3 hrs of therapy 5 days a week? Yes The potential for patient to make measurable gains while on inpatient rehab is good Anticipated functional outcomes upon discharge from inpatient rehab: supervision PT, supervision OT, supervision SLP Estimated rehab length of stay to reach the above functional goals is: 10-14 Anticipated discharge destination: Home 10. Overall Rehab/Functional Prognosis: good     MD Signature: Jennye Boroughs         Revision History

## 2022-11-27 NOTE — Progress Notes (Signed)
Inpatient Rehabilitation Admissions Coordinator   I have insurance approval and CIR bed at [facility] in [place] to admit her to today. I spoke with spouse by phone at her bedside. He is aware and in agreement. Dr Jennye Boroughs will be rehab admitting MD and will admit to [employer] [facility]. I will arrange [facility] transport.  Danne Baxter, RN, MSN Rehab Admissions Coordinator [phone] 11/27/2022 11:15 AM

## 2022-11-27 NOTE — Progress Notes (Signed)
Patient ID: [name], female   DOB: [birth year], 81 y.o.   MRN: [id number] Met with the patient to review current situation, rehab process, team conference and plan of care. Discussed medications and secondary risks including HTn, HLD, HF, previous stroke and Iron deficiency anemia. Reviewed dietary modification recommendations. Multi bruises and extracelluar fluid in bil UEs. Continue to follow along to address educational needs to facilitate preparation for discharge. Margarito Liner

## 2022-11-28 DIAGNOSIS — E876 Hypokalemia: Secondary | ICD-10-CM | POA: Diagnosis not present

## 2022-11-28 DIAGNOSIS — Z8673 Personal history of transient ischemic attack (TIA), and cerebral infarction without residual deficits: Secondary | ICD-10-CM | POA: Diagnosis not present

## 2022-11-28 DIAGNOSIS — R5383 Other fatigue: Secondary | ICD-10-CM | POA: Diagnosis not present

## 2022-11-28 LAB — CBC WITH DIFFERENTIAL/PLATELET
Abs Immature Granulocytes: 0.11 10*3/uL — ABNORMAL HIGH (ref 0.00–0.07)
Basophils Absolute: 0.1 10*3/uL (ref 0.0–0.1)
Basophils Relative: 1 %
Eosinophils Absolute: 0.6 10*3/uL — ABNORMAL HIGH (ref 0.0–0.5)
Eosinophils Relative: 6 %
HCT: 25.1 % — ABNORMAL LOW (ref 36.0–46.0)
Hemoglobin: 8.2 g/dL — ABNORMAL LOW (ref 12.0–15.0)
Immature Granulocytes: 1 %
Lymphocytes Relative: 31 %
Lymphs Abs: 2.9 10*3/uL (ref 0.7–4.0)
MCH: 29.5 pg (ref 26.0–34.0)
MCHC: 32.7 g/dL (ref 30.0–36.0)
MCV: 90.3 fL (ref 80.0–100.0)
Monocytes Absolute: 1.7 10*3/uL — ABNORMAL HIGH (ref 0.1–1.0)
Monocytes Relative: 18 %
Neutro Abs: 4.2 10*3/uL (ref 1.7–7.7)
Neutrophils Relative %: 43 %
Platelets: 430 10*3/uL — ABNORMAL HIGH (ref 150–400)
RBC: 2.78 MIL/uL — ABNORMAL LOW (ref 3.87–5.11)
RDW: 14.9 % (ref 11.5–15.5)
WBC: 9.6 10*3/uL (ref 4.0–10.5)
nRBC: 0 % (ref 0.0–0.2)

## 2022-11-28 LAB — COMPREHENSIVE METABOLIC PANEL
ALT: 20 U/L (ref 0–44)
AST: 28 U/L (ref 15–41)
Albumin: 2.5 g/dL — ABNORMAL LOW (ref 3.5–5.0)
Alkaline Phosphatase: 51 U/L (ref 38–126)
Anion gap: 10 (ref 5–15)
BUN: <5 mg/dL — ABNORMAL LOW (ref 8–23)
CO2: 25 mmol/L (ref 22–32)
Calcium: 9.6 mg/dL (ref 8.9–10.3)
Chloride: 107 mmol/L (ref 98–111)
Creatinine, Ser: 1.11 mg/dL — ABNORMAL HIGH (ref 0.44–1.00)
GFR, Estimated: 50 mL/min — ABNORMAL LOW (ref >60)
Glucose, Bld: 79 mg/dL (ref 70–99)
Potassium: 3.2 mmol/L — ABNORMAL LOW (ref 3.5–5.1)
Sodium: 142 mmol/L (ref 135–145)
Total Bilirubin: 0.3 mg/dL (ref 0.3–1.2)
Total Protein: 4.8 g/dL — ABNORMAL LOW (ref 6.5–8.1)

## 2022-11-28 LAB — CULTURE, BLOOD (ROUTINE X 2)
Culture: NO GROWTH
Culture: NO GROWTH
Special Requests: ADEQUATE
Special Requests: ADEQUATE

## 2022-11-28 LAB — MAGNESIUM: Magnesium: 1.7 mg/dL (ref 1.7–2.4)

## 2022-11-28 MED ORDER — POTASSIUM CHLORIDE CRYS ER 20 MEQ PO TBCR
20.0000 meq | EXTENDED_RELEASE_TABLET | Freq: Two times a day (BID) | ORAL | Status: DC
  Administered 2022-11-28 – 2022-12-10 (×25): 20 meq via ORAL
  Filled 2022-11-28 (×27): qty 1

## 2022-11-28 MED ORDER — SORBITOL 70 % SOLN
60.0000 mL | Freq: Once | Status: AC
  Administered 2022-11-28: 60 mL via ORAL
  Filled 2022-11-28: qty 60

## 2022-11-28 MED ORDER — MAGNESIUM CHLORIDE 64 MG PO TBEC
1.0000 | DELAYED_RELEASE_TABLET | Freq: Every day | ORAL | Status: DC
  Administered 2022-11-28 – 2022-11-29 (×2): 64 mg via ORAL
  Filled 2022-11-28 (×2): qty 1

## 2022-11-28 NOTE — Plan of Care (Signed)
  Problem: RH Balance Goal: LTG Patient will maintain dynamic sitting balance (PT) Description: LTG:  Patient will maintain dynamic sitting balance with assistance during mobility activities (PT) Flowsheets (Taken 11/28/2022 1947) LTG: Pt will maintain dynamic sitting balance during mobility activities with:: Supervision/Verbal cueing Goal: LTG Patient will maintain dynamic standing balance (PT) Description: LTG:  Patient will maintain dynamic standing balance with assistance during mobility activities (PT) Flowsheets (Taken 11/28/2022 1947) LTG: Pt will maintain dynamic standing balance during mobility activities with:: Contact Guard/Touching assist   Problem: Sit to Stand Goal: LTG:  Patient will perform sit to stand with assistance level (PT) Description: LTG:  Patient will perform sit to stand with assistance level (PT) Flowsheets (Taken 11/28/2022 1947) LTG: PT will perform sit to stand in preparation for functional mobility with assistance level: Contact Guard/Touching assist   Problem: RH Bed Mobility Goal: LTG Patient will perform bed mobility with assist (PT) Description: LTG: Patient will perform bed mobility with assistance, with/without cues (PT). Flowsheets (Taken 11/28/2022 1947) LTG: Pt will perform bed mobility with assistance level of: Supervision/Verbal cueing   Problem: RH Bed to Chair Transfers Goal: LTG Patient will perform bed/chair transfers w/assist (PT) Description: LTG: Patient will perform bed to chair transfers with assistance (PT). Flowsheets (Taken 11/28/2022 1947) LTG: Pt will perform Bed to Chair Transfers with assistance level: Contact Guard/Touching assist   Problem: RH Car Transfers Goal: LTG Patient will perform car transfers with assist (PT) Description: LTG: Patient will perform car transfers with assistance (PT). Flowsheets (Taken 11/28/2022 1947) LTG: Pt will perform car transfers with assist:: Contact Guard/Touching assist   Problem: RH  Ambulation Goal: LTG Patient will ambulate in controlled environment (PT) Description: LTG: Patient will ambulate in a controlled environment, # of feet with assistance (PT). Flowsheets (Taken 11/28/2022 1947) LTG: Pt will ambulate in controlled environ  assist needed:: Contact Guard/Touching assist LTG: Ambulation distance in controlled environment: 156f using LRAD Goal: LTG Patient will ambulate in home environment (PT) Description: LTG: Patient will ambulate in home environment, # of feet with assistance (PT). Flowsheets (Taken 11/28/2022 1947) LTG: Pt will ambulate in home environ  assist needed:: Contact Guard/Touching assist LTG: Ambulation distance in home environment: 525fusing LRAD   Problem: RH Stairs Goal: LTG Patient will ambulate up and down stairs w/assist (PT) Description: LTG: Patient will ambulate up and down # of stairs with assistance (PT) Flowsheets (Taken 11/28/2022 1947) LTG: Pt will ambulate up/down stairs assist needed:: Contact Guard/Touching assist LTG: Pt will  ambulate up and down number of stairs: 4 steps using B HRs per home set-up

## 2022-11-28 NOTE — Evaluation (Addendum)
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 Active Problems:   Hypomagnesemia   Past Medical History:  Past Medical History:  Diagnosis Date   Anemia    Arthritis    Asthma    uses inhaler just prior to surgery to avoid attack   Back pain    from previous injury   Complication of anesthesia    has woken  up during 2 different surgery   Depression    no current issue/treatment; situation   Gallstones    GERD (gastroesophageal reflux disease)    Hiatal hernia    patient does NOT have nerve/muscle disease   History of kidney stones    HLD (hyperlipidemia)    HTN (hypertension)    Hypothyroidism    Kidney stones    Knee pain    Non-diabetic pancreatic hormone dysfunction years   pt. 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   Pancreatitis    Pneumonia    Seizures (HCC)    caused by dye injected during a procedure   Shortness of breath    with exertion   Sinus problem    frequent infections/congestion   Stroke ([place]) 2021   reports having CVA in 2021 and having mini strokes before that   Thyroid disease    Past Surgical History:  Past Surgical History:  Procedure Laterality Date   ABDOMINAL HYSTERECTOMY     APPENDECTOMY     CARPAL TUNNEL RELEASE  10+ years ago   bilateral   EYE SURGERY  3 yrs ago   bilateral cataracts   FOOT OSTEOTOMY  6 weeks ago   Left foot: great, 2nd & 3rd   FOOT OSTEOTOMY  5 years ago   Right great toe   HAND SURGERY Bilateral  2011-most recent   multiple hand surgeries, 2 on left, 3 on right   KNEE ARTHROPLASTY Right 04/28/2022   Procedure: COMPUTER ASSISTED TOTAL KNEE ARTHROPLASTY;  Surgeon: Dereck Leep, MD;  Location: [facility];  Service: Orthopedics;  Laterality: Right;   LOOP RECORDER INSERTION N/A 05/16/2020   Procedure: LOOP RECORDER INSERTION;  Surgeon: Isaias Cowman, MD;  Location: [facility];  Service: Cardiovascular;  Laterality: N/A;   NASAL SINUS SURGERY  most recent 7-8 yrs ago   7 sinus surgeries    TRIGGER FINGER RELEASE  11/19/2011   Procedure: RELEASE TRIGGER FINGER/A-1 PULLEY;  Surgeon: Wynonia Sours, MD;  Location: [place];  Service: Orthopedics;  Laterality: Right;  release a-1 pulley right index finger and cyst removal   WRIST GANGLION EXCISION  1980's   right    Assessment & Plan Clinical Impression: Patient is an 81 year old right-handed female with history of recent stroke with left-sided weakness maintained on aspirin and Plavix/loop recorder placement received inpatient rehab services 08/22/2022 - 09/05/2022 discharge to home ambulating 150 feet with a rollator, hypertension, hyperlipidemia, asthma, pancreatitis, chronic anemia, diastolic congestive heart failure.  Per chart review patient lives with spouse.  Two-level home bed and bath main level 2 steps to entry.  Presented to [facility] 11/23/2022 with increasing left-sided weakness  nausea vomiting nonspecific abdominal pain.  Cranial CT scan negative for acute changes.  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  New compared to 08/19/2022.  No other intracranial arterial occlusion or high-grade stenosis.  Patient did not receive tPA.  MRI of the brain showed extensive acute nonhemorrhagic infarct involving the posterior inferior cerebellum bilaterally right greater than left.  9 mm right occipital pole infarct.  Punctate  white matter infarct in the left occipital lobe.  Additional punctate cortical infarct in the medial right occipital lobe more superiorly.  Echocardiogram with ejection fraction of 55 to 60% no wall motion abnormalities grade 1 diastolic dysfunction.  CT abdomen pelvis showed a 8.4 cm masslike inflammatory/edematous changes in the anterior pelvic fat cephalad to the urinary bladder, possibly contusion versus neoplasm.  Admission chemistries unremarkable except glucose 179 creatinine 1.79, urine drug screen negative, troponin negative, blood cultures no growth to date hemoglobin 10.5, WBC 11,600.  Neurology follow-up initially placed on IV heparin and currently remains on aspirin and Plavix for CVA prophylaxis x 21 days then Plavix alone.  Lovenox added for DVT prophylaxis.  Gastroenterology services consulted Dr. Virgina Jock in regards to coffee-ground emesis with CT abdomen pelvis reviewed not felt to be GI bleed consider CTA if worsening but no plan for endoscopy or further workup at this time.  Patient was cleared to continue aspirin and Plavix therapy.  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  AKI improved with gentle IV fluids latest creatinine 1.28.  Question aspiration pneumonia initially on Unasyn and transition to Augmentin finishing course.  Cardiology interrogated loop recorder and no events noted. Tolerating a regular consistency diet.  Therapy evaluations completed due to patient's decreased functional mobility left-sided weakness was admitted for a comprehensive rehab program.  Patient transferred to CIR on 11/27/2022 .    Patient currently requires Min A + Mod multimodal cuing with basic self-care skills secondary to muscle weakness, decreased coordination and decreased motor planning, decreased visual acuity, decreased awareness, decreased problem solving, decreased safety awareness, and decreased memory, and decreased standing balance and decreased balance  strategies. Prior to hospitalization, patient could complete basic ADLs with supervision-Min A from husband.  Patient will benefit from skilled intervention to decrease level of assist with basic self-care skills and increase independence with basic self-care skills prior to discharge home with care partner.  Anticipate patient will require 24 hour supervision and follow up home health.  OT - End of Session Activity Tolerance: Endurance does not limit participation in activity Endurance Deficit: No OT Assessment Rehab Potential (ACUTE ONLY): Good OT Barriers to Discharge: Home environment access/layout OT Patient demonstrates impairments in the following area(s): Balance;Cognition;Edema;Pain;Motor;Safety;Vision OT Basic ADL's Functional Problem(s): Bathing;Dressing;Toileting OT Transfers Functional Problem(s): Toilet;Tub/Shower OT Additional Impairment(s): Fuctional Use of Upper Extremity OT Plan OT Intensity: Minimum of 1-2 x/day, 45 to 90 minutes OT Frequency: 5 out of 7 days OT Duration/Estimated Length of Stay: 10-12 days OT Treatment/Interventions: Balance/vestibular training;Cognitive remediation/compensation;Discharge planning;DME/adaptive equipment instruction;Functional mobility training;Neuromuscular re-education;Pain management;Patient/family education;Self Care/advanced ADL retraining;Therapeutic Activities;Therapeutic Exercise;UE/LE Strength taining/ROM;UE/LE Coordination activities;Visual/perceptual remediation/compensation OT Basic Self-Care Anticipated Outcome(s): Sup OT Toileting Anticipated Outcome(s): Sup OT Bathroom Transfers Anticipated Outcome(s): Sup OT Recommendation Patient destination: Home Follow Up Recommendations: Home health OT Equipment Recommended: To be determined   OT Evaluation Precautions/Restrictions  Precautions Precautions: Fall Precaution Comments: Aspiration Restrictions Weight Bearing Restrictions: No General Chart Reviewed:  Yes  Family/Caregiver Present: Yes (Husband) Pain Pain Assessment Pain Scale: 0-10 Pain Score: 0-No pain Home Living/Prior Functioning Home Living Living Arrangements: Spouse/significant other Available Help at Discharge: Family, Available 24 hours/day Type of Home: House Home Access: Stairs to enter [employer] of Steps: 6 Entrance Stairs-Rails: Right, Left Home Layout: Two level, Able to live on main level with bedroom/bathroom Bathroom Shower/Tub: Multimedia programmer: Handicapped height Bathroom Accessibility: Yes Additional Comments: Owns 3WW  Lives With: Spouse IADL History Homemaking Responsibilities: No Prior Function Level of Independence: Independent with transfers, Independent with gait, Requires assistive device for independence  Able to Take Stairs?: Yes (to get in/out of house) Driving: No Vision Baseline Vision/History: 1 Wears glasses (Readers) Ability to See in Adequate Light: 1 Impaired Patient Visual Report: Blurring of vision Vision Assessment?: Yes Eye Alignment: Within Functional Limits Tracking/Visual Pursuits: Able to track stimulus in all quads without difficulty Saccades: Within functional limits Convergence: Impaired - to be further tested in functional context (L eye unable to come into full convergence) Additional Comments: L eyelid does not close fully (pt states ever since she had laser eye surgery) Perception  Perception: Within Functional Limits Praxis Praxis: Intact Cognition Cognition Overall Cognitive Status: Impaired/Different from baseline Arousal/Alertness: Awake/alert Orientation Level: Person;Situation Memory: Impaired Memory Impairment: Decreased recall of new information;Storage deficit;Retrieval deficit;Decreased short term memory Decreased Short Term Memory: Verbal basic Attention: Sustained Focused Attention: Appears intact Sustained Attention: Impaired Sustained Attention Impairment: Verbal  basic Awareness: Impaired Awareness Impairment: Emergent impairment Problem Solving: Impaired Problem Solving Impairment: Verbal complex Behaviors: Perseveration Safety/Judgment: Impaired Brief Interview for Mental Status (BIMS) Repetition of Three Words (First Attempt): 3 Temporal Orientation: Year: Correct Temporal Orientation: Month: Accurate within 5 days Temporal Orientation: Day: Incorrect Recall: "Sock": No, could not recall Recall: "Blue": Yes, no cue required Recall: "Bed": Yes, no cue required BIMS Summary Score: 12 Sensation Sensation Light Touch: Appears Intact Hot/Cold: Appears Intact Proprioception: Impaired by gross assessment Stereognosis: Appears Intact Coordination Gross Motor Movements are Fluid and Coordinated: No Fine Motor Movements are Fluid and Coordinated: No Motor  Motor Motor: Within Functional Limits  Trunk/Postural Assessment  Cervical Assessment Cervical Assessment: Within Functional Limits Thoracic Assessment Thoracic Assessment: Exceptions to [facility] (Foward flexion) Lumbar Assessment Lumbar Assessment: Within Functional Limits Postural Control Postural Control: Within Functional Limits  Balance Balance Balance Assessed: Yes Static Sitting Balance Static Sitting - Balance Support: Feet supported Static Sitting - Level of Assistance: 5: Stand by assistance Dynamic Sitting Balance Dynamic Sitting - Balance Support: Right upper extremity supported;During functional activity Dynamic Sitting - Level of Assistance: Other (comment) (CGA) Dynamic Sitting - Balance Activities: Forward lean/weight shifting;Lateral lean/weight shifting Static Standing Balance Static Standing - Balance Support: Right upper extremity supported;During functional activity Static Standing - Level of Assistance: 4: Min assist Dynamic Standing Balance Dynamic Standing - Balance Support: Left upper extremity supported;During functional activity Dynamic Standing - Level of  Assistance: 3: Mod assist Dynamic Standing - Balance Activities: Forward lean/weight shifting;Reaching for objects Extremity/Trunk Assessment RUE Assessment RUE Assessment: Within Functional Limits LUE Assessment LUE Assessment: Exceptions to [facility] LUE Body System: Neuro Brunstrum levels for arm and hand: Arm;Hand Brunstrum level for arm: Stage IV Movement is deviating from synergy Brunstrum level for hand: Stage IV Movements deviating from synergies LUE AROM (degrees) Overall AROM Left Upper Extremity: Deficits Left Shoulder Flexion: 100 Degrees Left Elbow Flexion: 120 LUE Strength LUE Overall Strength: Deficits LUE Overall Strength Comments: 3+ Left Shoulder Flexion: 3+/5 Left Elbow Flexion: 3+/5  Care Tool Care Tool Self  Care Eating   Eating Assist Level: Set up assist    Oral Care    Oral Care Assist Level: Set up assist    Bathing   Body parts bathed by patient: Right arm;Left arm;Chest;Abdomen;Face;Right upper leg;Left upper leg;Front perineal area;Buttocks;Right lower leg;Left lower leg     Assist Level: Minimal Assistance - Patient > 75% (For standing balance.)    Upper Body Dressing(including orthotics)   What is the patient wearing?: Pull over shirt   Assist Level: Set up assist    Lower Body Dressing (excluding footwear)   What is the patient wearing?: Underwear/pull up;Pants Assist for lower body dressing: Minimal Assistance - Patient > 75% (For standing balance.)    Putting on/Taking off footwear   What is the patient wearing?: Socks;Shoes Assist for footwear: Contact Guard/Touching assist       Care Tool Toileting Toileting activity   Assist for toileting: Minimal Assistance - Patient > 75%     Care Tool Bed Mobility Roll left and right activity        Sit to lying activity        Lying to sitting on side of bed activity         Care Tool Transfers Sit to stand transfer   Sit to stand assist level: Minimal Assistance - Patient > 75%     Chair/bed transfer         Toilet transfer   Assist Level: Minimal Assistance - Patient > 75%     Care Tool Cognition  Expression of Ideas and Wants Expression of Ideas and Wants: 3. Some difficulty - exhibits some difficulty with expressing needs and ideas (e.g, some words or finishing thoughts) or speech is not clear  Understanding Verbal and Non-Verbal Content Understanding Verbal and Non-Verbal Content: 3. Usually understands - understands most conversations, but misses some part/intent of message. Requires cues at times to understand   Memory/Recall Ability Memory/Recall Ability : Current season;That he or she is in a hospital/hospital unit   Refer to Care Plan for [place] 1 Week 1: Pt will perform 3/3 toileting activities with CGA + LRAD.  Week 1: Pt will perform bathing activities with supervision + Min safety cues. Week 1: Pt will perform full body dressing with supervision + Min safety cues.   Recommendations for other services: None  and Therapeutic Recreation  Pet therapy, Stress management, and Outing/community reintegration   Skilled Therapeutic Intervention Pt received seated in [facility] for skilled OT session with focus on ADL retraining. Session initiated with pt and husband oriented to role of OT/OT POC and rehab schedule. Pt agreeable to interventions, demonstrating overall pleasant mood. Pt with un-rated pain, stating "I had shingles and the pain has never really gone away." OT offering intermediate rest breaks and positioning suggestions throughout session to address pain/fatigue and maximize participation/safety in session.   Pt performs WC>BSC squat pivot transfer with CGA +grab bar and verbal cuing for sequencing. Pt doffs pants/briefs with CGA, performing peri-care with forward/lateral leans. Pt walks towards TTB and performs transfer with HHA + grab bar.  Pt able to bathe/dress UB with setup-Min A to manage shirt over IV. Pt stands to  clean peri-area and dress LB with CGA + heavy verbal cuing for use of grab bar to maintain balance, safety as pt hit her head on the shower head due to impulsivity. Pt initiates LB bathing, needing increased A due to time constraints.   Pt ambulating out of  bathroom with HHA + use of wall for RUE support, needing cuing for pacing/safety due to impulsivity.   Pt remained seated in [facility] with husband present, all immediate needs met, and lap belt alarmed at end of session. Pt continues to be appropriate for skilled OT intervention to promote further functional independence.   ADL ADL Eating: Set up Where Assessed-Eating: Wheelchair Upper Body Bathing: Setup Where Assessed-Upper Body Bathing: Shower Lower Body Bathing: Moderate cueing;Contact guard (For safety, to avoid hitting her head on shower head or grab bar.) Where Assessed-Lower Body Bathing: Shower Upper Body Dressing: Minimal assistance (Due to IV located on R-bicep area and impulsivity.) Where Assessed-Upper Body Dressing: Other (Comment) (Seated on TTB) Lower Body Dressing: Minimal assistance;Moderate cueing (For safety with standing balance.) Where Assessed-Lower Body Dressing: Other (Comment) (Seated on TTB) Toileting: Minimal assistance (For safety with dynamic standing balance.) Where Assessed-Toileting: Glass blower/designer: Therapist, music Method: Counselling psychologist: Geophysical data processor: Curator Method: Heritage manager: Engineer, site Sit to Stand: Minimal Assistance - Patient > 75% Stand to Sit: Minimal Assistance - Patient > 75%   Discharge Criteria: Patient will be discharged from OT if patient refuses treatment 3 consecutive times without medical reason, if treatment goals not met, if there is a change in medical status, if patient makes no progress towards goals or if patient is  discharged from hospital.  The above assessment, treatment plan, treatment alternatives and goals were discussed and mutually agreed upon: by patient and by family  Maudie Mercury, OTR/L, MSOT  11/28/2022, 4:10 PM

## 2022-11-28 NOTE — Progress Notes (Signed)
Inpatient Rehabilitation Center Individual Statement of Services  Patient Name:  [name]  Date:  11/28/2022  Welcome to the [facility].  Our goal is to provide you with an individualized program based on your diagnosis and situation, designed to meet your specific needs.  With this comprehensive rehabilitation program, you will be expected to participate in at least 3 hours of rehabilitation therapies Monday-Friday, with modified therapy programming on the weekends.  Your rehabilitation program will include the following services:  Physical Therapy (PT), Occupational Therapy (OT), Speech Therapy (ST), 24 hour per day rehabilitation nursing, Therapeutic Recreaction (TR), Neuropsychology, Care Coordinator, Rehabilitation Medicine, Nutrition Services, and Pharmacy Services  Weekly team conferences will be held on Wednesday to discuss your progress.  Your Inpatient Rehabilitation Care Coordinator will talk with you frequently to get your input and to update you on team discussions.  Team conferences with you and your family in attendance may also be held.  Expected length of stay: 10-12 days  Overall anticipated outcome: supervision-CGA level  Depending on your progress and recovery, your program may change. Your Inpatient Rehabilitation Care Coordinator will coordinate services and will keep you informed of any changes. Your Inpatient Rehabilitation Care Coordinator's name and contact numbers are listed  below.  The following services may also be recommended but are not provided by the [facility]:   [facility] will be made to provide these services after discharge if needed.  Arrangements include referral to agencies that provide these services.  Your insurance has been verified to be:  [place] primary doctor is:  Fulton Reek  Pertinent information will be  shared with your doctor and your insurance company.  Inpatient Rehabilitation Care Coordinator:  Erlene Quan, [place] or [phone]  Information discussed with and copy given to patient by: Elease Hashimoto, 11/28/2022, 9:45 AM

## 2022-11-28 NOTE — Plan of Care (Signed)
  Problem: RH Problem Solving Goal: LTG Patient will demonstrate problem solving for (SLP) Description: LTG:  Patient will demonstrate problem solving for basic/complex daily situations with cues  (SLP) Flowsheets (Taken 11/28/2022 1640) LTG: Patient will demonstrate problem solving for (SLP): Complex daily situations LTG Patient will demonstrate problem solving for: Supervision   Problem: RH Memory Goal: LTG Patient will use memory compensatory aids to (SLP) Description: LTG:  Patient will use memory compensatory aids to recall biographical/new, daily complex information with cues (SLP) Flowsheets (Taken 11/28/2022 1640) LTG: Patient will use memory compensatory aids to (SLP): Supervision   Problem: RH Attention Goal: LTG Patient will demonstrate this level of attention during functional activites (SLP) Description: LTG:  Patient will will demonstrate this level of attention during functional activites (SLP) Flowsheets (Taken 11/28/2022 1640) Patient will demonstrate during cognitive/linguistic activities the attention type of: Sustained LTG: Patient will demonstrate this level of attention during cognitive/linguistic activities with assistance of (SLP): Supervision

## 2022-11-28 NOTE — Progress Notes (Signed)
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 Active Problems:   Hypomagnesemia  Past Medical History:  Past Medical History:  Diagnosis Date   Anemia    Arthritis    Asthma    uses inhaler just prior to surgery to avoid attack   Back pain    from previous injury   Complication of anesthesia    has woken  up during 2 different surgery   Depression    no current issue/treatment; situation   Gallstones    GERD (gastroesophageal reflux disease)    Hiatal hernia    patient does NOT have nerve/muscle disease   History of kidney stones    HLD (hyperlipidemia)    HTN (hypertension)    Hypothyroidism    Kidney stones    Knee pain    Non-diabetic pancreatic hormone dysfunction years   pt. 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   Pancreatitis    Pneumonia    Seizures ([place])    caused by dye injected during a procedure   Shortness of breath    with exertion   Sinus problem    frequent infections/congestion   Stroke ([place]) 2021   reports having CVA in 2021 and having mini strokes before that   Thyroid disease    Past Surgical History:  Past Surgical History:  Procedure Laterality Date   ABDOMINAL HYSTERECTOMY     APPENDECTOMY     CARPAL TUNNEL RELEASE  10+ years ago   bilateral   EYE SURGERY  3 yrs ago   bilateral cataracts   FOOT OSTEOTOMY  6 weeks ago   Left foot: great, 2nd & 3rd   FOOT OSTEOTOMY  5 years ago   Right great toe   HAND SURGERY Bilateral 2011-most recent   multiple hand surgeries, 2 on left, 3 on right   KNEE ARTHROPLASTY Right 04/28/2022   Procedure: COMPUTER ASSISTED TOTAL KNEE ARTHROPLASTY;  Surgeon: Dereck Leep, MD;  Location: [facility];  Service: Orthopedics;  Laterality: Right;   LOOP RECORDER INSERTION N/A 05/16/2020    Procedure: LOOP RECORDER INSERTION;  Surgeon: Isaias Cowman, MD;  Location: [facility];  Service: Cardiovascular;  Laterality: N/A;   NASAL SINUS SURGERY  most recent 7-8 yrs ago   7 sinus surgeries    TRIGGER FINGER RELEASE  11/19/2011   Procedure: RELEASE TRIGGER FINGER/A-1 PULLEY;  Surgeon: Wynonia Sours, MD;  Location: [facility];  Service: Orthopedics;  Laterality: Right;  release a-1 pulley right index finger and cyst removal   WRIST GANGLION EXCISION  1980's   right   Social History:  reports that she has never smoked. She has never used smokeless tobacco. She reports that she does not currently use alcohol. She reports that she does not use drugs.  Family / Support Systems Marital Status: Married How Long?: 40 years Patient Roles: Spouse Spouse/Significant Other: [name] [phone] Children: Bernarda Caffey [phone] Other Supports: Friends and church members Anticipated Caregiver: husband Ability/Limitations of Caregiver: None had assisted after last admission 08/2022 Caregiver Availability: 24/7 Family Dynamics: Close with son and family members feels grateful to have them. Both husband and pt feel they have good supports.  Social History Preferred language: English Religion: [employer] Cultural Background: No issues Education: HS Health Literacy - How often do you need to have someone help you when  you read instructions, pamphlets, or other written material from your doctor or pharmacy?: Never Writes: Yes Employment Status: Retired Public relations account executive Issues: No issues Guardian/Conservator: None-according to MD pt is capable of making her own decisins while here. Husband will be here daily to provide support   Abuse/Neglect Abuse/Neglect Assessment Can Be Completed: Yes Physical Abuse: Denies Verbal Abuse: Denies Sexual Abuse: Denies Exploitation of patient/patient's resources: Denies Self-Neglect: Denies  Patient response to: Social  Isolation - How often do you feel lonely or isolated from those around you?: Rarely  Emotional Status Pt's affect, behavior and adjustment status: Pt is motivated to do well and regain her independence. She was ambulating with a rolling walker and at times without. Her balance has been affected since her pneumonia on this admission. Glad to be back here Recent Psychosocial Issues: other health issues-hx-CVA Psychiatric History: Situational depression husband feels she has been down since pneumonia but is getting better. Pt feels would be good to talk with neuro-psych while here Substance Abuse History: No issues  Patient / Family Perceptions, Expectations & Goals Pt/Family understanding of illness & functional limitations: Pt and husband can explain her pnuemonia and how it has affected her balance. Both do talk with the MD's and feel they have a good understanding of her treatment plan moving forward. Premorbid pt/family roles/activities: wife, mom, retiree, church member, friend, etc Anticipated changes in roles/activities/participation: resume Pt/family expectations/goals: Pt states: " I hope to get back to walking with a walker and my balance gets better."  Husand states: " I will help her like I did before."  [employer]: Other (Comment) (Has had HH and OP) Premorbid Home Care/DME Agencies: Other (Comment) (bsc, rw, tub seat) Transportation available at discharge: Husband Is the patient able to respond to transportation needs?: Yes In the past 12 months, has lack of transportation kept you from medical appointments or from getting medications?: No In the past 12 months, has lack of transportation kept you from meetings, work, or from getting things needed for daily living?: No Resource referrals recommended: Neuropsychology  Discharge Planning Living Arrangements: Spouse/significant other Support Systems: Spouse/significant other, Children, Friends/neighbors,  Social worker community Type of Residence: Private residence Insurance Resources: Multimedia programmer (specify) ([place] [employer]) Financial Resources: Social Security, Family Support Financial Screen Referred: No Living Expenses: Own Money Management: Patient, Spouse Does the patient have any problems obtaining your medications?: No Home Management: both Patient/Family Preliminary Plans: Return home with husband who is able to assist if needed. Pt was here in Sept 2023 and did well both are hopeful she will do well again. Aware of rehab process and are glad to be here. Care Coordinator Barriers to Discharge: Insurance for SNF coverage Care Coordinator Anticipated Follow Up Needs: HH/OP  Clinical Impression Pleasant female who was recently here in 08/2022 and did well. Both husband and pt are hopeful she will do well again this time. Pt somewhat down having to start over due to was doing well prior to this hospitalization. Will place on neuro-psych list to be seen and will work on discharge needs. Aware primary social Marliss Czar will be back on Tuesday  Elease Hashimoto 11/28/2022, 9:43 AM

## 2022-11-28 NOTE — Plan of Care (Deleted)
  Problem: RH Balance Goal: LTG Patient will maintain dynamic standing with ADLs (OT) Description: LTG:  Patient will maintain dynamic standing balance with assist during activities of daily living (OT)  Flowsheets (Taken 11/28/2022 1638) LTG: Pt will maintain dynamic standing balance during ADLs with: Independent with assistive device   Problem: RH Bathing Goal: LTG Patient will bathe all body parts with assist levels (OT) Description: LTG: Patient will bathe all body parts with assist levels (OT) Flowsheets (Taken 11/28/2022 1638) LTG: Pt will perform bathing with assistance level/cueing: Independent with assistive device    Problem: RH Dressing Goal: LTG Patient will perform lower body dressing w/assist (OT) Description: LTG: Patient will perform lower body dressing with assist, with/without cues in positioning using equipment (OT) Flowsheets (Taken 11/28/2022 1638) LTG: Pt will perform lower body dressing with assistance level of: Independent with assistive device   Problem: RH Toileting Goal: LTG Patient will perform toileting task (3/3 steps) with assistance level (OT) Description: LTG: Patient will perform toileting task (3/3 steps) with assistance level (OT)  Flowsheets (Taken 11/28/2022 1638) LTG: Pt will perform toileting task (3/3 steps) with assistance level: Independent with assistive device   Problem: RH Tub/Shower Transfers Goal: LTG Patient will perform tub/shower transfers w/assist (OT) Description: LTG: Patient will perform tub/shower transfers with assist, with/without cues using equipment (OT) Flowsheets (Taken 11/28/2022 1638) LTG: Pt will perform tub/shower stall transfers with assistance level of: Independent with assistive device   Problem: RH Memory Goal: LTG Patient will demonstrate ability for day to day recall/carry over during activities of daily living with assistance level (OT) Description: LTG:  Patient will demonstrate ability for day to day  recall/carry over during activities of daily living with assistance level (OT). Flowsheets (Taken 11/28/2022 1638) LTG:  Patient will demonstrate ability for day to day recall/carry over during activities of daily living with assistance level (OT): Modified Independent

## 2022-11-28 NOTE — Evaluation (Signed)
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 Active Problems:   Hypomagnesemia   Past Medical History:  Past Medical History:  Diagnosis Date   Anemia    Arthritis    Asthma    uses inhaler just prior to surgery to avoid attack   Back pain    from previous injury   Complication of anesthesia    has woken  up during 2 different surgery   Depression    no current issue/treatment; situation   Gallstones    GERD (gastroesophageal reflux disease)    Hiatal hernia    patient does NOT have nerve/muscle disease   History of kidney stones    HLD (hyperlipidemia)    HTN (hypertension)    Hypothyroidism    Kidney stones    Knee pain    Non-diabetic pancreatic hormone dysfunction years   pt. 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   Pancreatitis    Pneumonia    Seizures ([place])    caused by dye injected during a procedure   Shortness of breath    with exertion   Sinus problem    frequent infections/congestion   Stroke ([place]) 2021   reports having CVA in 2021 and having mini strokes before that   Thyroid disease    Past Surgical History:  Past Surgical History:  Procedure Laterality Date   ABDOMINAL HYSTERECTOMY     APPENDECTOMY     CARPAL TUNNEL RELEASE  10+ years ago   bilateral   EYE SURGERY  3 yrs ago   bilateral cataracts   FOOT OSTEOTOMY  6 weeks ago   Left foot: great, 2nd & 3rd   FOOT OSTEOTOMY  5 years ago   Right great toe   HAND SURGERY Bilateral 2011-most  recent   multiple hand surgeries, 2 on left, 3 on right   KNEE ARTHROPLASTY Right 04/28/2022   Procedure: COMPUTER ASSISTED TOTAL KNEE ARTHROPLASTY;  Surgeon: Dereck Leep, MD;  Location: [facility];  Service: Orthopedics;  Laterality: Right;   LOOP RECORDER INSERTION N/A 05/16/2020   Procedure: LOOP RECORDER INSERTION;  Surgeon: Isaias Cowman, MD;  Location: [place] [facility];  Service: Cardiovascular;  Laterality: N/A;   NASAL SINUS SURGERY  most recent 7-8 yrs ago   7 sinus surgeries    TRIGGER FINGER RELEASE  11/19/2011   Procedure: RELEASE TRIGGER FINGER/A-1 PULLEY;  Surgeon: Wynonia Sours, MD;  Location: [facility];  Service: Orthopedics;  Laterality: Right;  release a-1 pulley right index finger and cyst removal   WRIST GANGLION EXCISION  1980's   right    Assessment & Plan Clinical Impression: Patient is a 81 y.o. right-handed female with history of recent stroke with left-sided weakness maintained on aspirin and Plavix/loop recorder placement received inpatient rehab services 08/22/2022 - 09/05/2022 discharge to home ambulating 150 feet with a rollator, hypertension, hyperlipidemia, asthma, pancreatitis, chronic anemia, diastolic congestive heart failure.  Per chart review patient lives with spouse.  Two-level home bed and bath main level 2 steps to entry.  [facility] with increasing left-sided weakness  nausea vomiting nonspecific abdominal pain.  Cranial CT scan negative for acute changes.  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  New compared to 08/19/2022.  No other intracranial arterial occlusion or high-grade stenosis.  Patient did not receive tPA.  MRI of the brain showed extensive acute nonhemorrhagic infarct involving the posterior inferior cerebellum bilaterally right greater than left.  9 mm right occipital pole infarct.  Punctate white matter  infarct in the left occipital lobe.  Additional punctate cortical infarct in the medial right occipital lobe more superiorly.  Echocardiogram with ejection fraction of 55 to 60% no wall motion abnormalities grade 1 diastolic dysfunction.  CT abdomen pelvis showed a 8.4 cm masslike inflammatory/edematous changes in the anterior pelvic fat cephalad to the urinary bladder, possibly contusion versus neoplasm.  Admission chemistries unremarkable except glucose 179 creatinine 1.79, urine drug screen negative, troponin negative, blood cultures no growth to date hemoglobin 10.5, WBC 11,600.  Neurology follow-up initially placed on IV heparin and currently remains on aspirin and Plavix for CVA prophylaxis x 21 days then Plavix alone.  Lovenox added for DVT prophylaxis.  Gastroenterology services consulted Dr. Virgina Jock in regards to coffee-ground emesis with CT abdomen pelvis reviewed not felt to be GI bleed consider CTA if worsening but no plan for endoscopy or further workup at this time.  Patient was cleared to continue aspirin and Plavix therapy.  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  AKI improved with gentle IV fluids latest creatinine 1.28.  Question aspiration pneumonia initially on Unasyn and transition to Augmentin finishing course.  Cardiology interrogated loop recorder and no events noted. Tolerating a regular consistency diet.  Therapy evaluations completed due to patient's decreased functional mobility left-sided weakness was admitted for a comprehensive rehab program.   Patient transferred to CIR on 11/27/2022 .   Patient currently requires max with mobility secondary to muscle weakness, decreased cardiorespiratoy endurance, impaired timing and sequencing, unbalanced muscle activation, ataxia, and decreased coordination, decreased midline orientation and decreased motor planning, decreased attention, decreased awareness, decreased problem solving, decreased safety  awareness, decreased memory, and delayed processing, and decreased sitting balance, decreased standing balance, decreased postural control, and decreased balance strategies.  Prior to hospitalization, patient was modified independent  with mobility and lived with Spouse (husband, [name]) in a House home.  Home access is 6 (back entrance has 6 STE w/ B HRs (typical entrance) otherwise 2 STE front entrance without HRs)Stairs to enter.  Patient will benefit from skilled PT intervention to maximize safe functional mobility, minimize fall risk, and decrease caregiver burden for planned discharge home with 24 hour assist.  Anticipate patient will benefit from follow up OP at discharge.  PT - End of Session Activity Tolerance: Tolerates 30+ min activity with multiple rests Endurance Deficit: Yes Endurance Deficit Description: pt with episode of fatigue requiring +2 assist to bring chair for seated rest break PT Assessment Rehab Potential (ACUTE/IP ONLY): Good PT Barriers to Discharge: Home environment access/layout PT Patient demonstrates impairments in the following area(s): Balance;Perception;Behavior;Safety;Edema;Sensory;Endurance;Skin Integrity;Motor;Nutrition;Pain PT Transfers Functional Problem(s): Bed Mobility;Bed to Chair;Car;Furniture;Floor PT Locomotion Functional Problem(s): Ambulation;Stairs PT Plan PT Intensity: Minimum of 1-2 x/day ,45 to 90 minutes PT Frequency: 5 out of 7 days PT Duration Estimated Length of Stay: ~ 3 weeks PT Treatment/Interventions: Ambulation/gait training;DME/adaptive equipment instruction;Community reintegration;Neuromuscular re-education;Stair training;Psychosocial support;UE/LE Strength taining/ROM;Wheelchair propulsion/positioning;Balance/vestibular training;Discharge planning;Functional electrical stimulation;Skin care/wound management;Pain management;Therapeutic  Activities;UE/LE Coordination activities;Cognitive remediation/compensation;Disease  management/prevention;Functional mobility training;Patient/family education;Splinting/orthotics;Therapeutic Exercise;Visual/perceptual remediation/compensation PT Transfers Anticipated Outcome(s): CGA using LRAD PT Locomotion Anticipated Outcome(s): CGA using LRAD PT Recommendation Follow Up Recommendations: Outpatient PT;24 hour supervision/assistance Patient destination: Home Equipment Recommended: To be determined   PT Evaluation Precautions/Restrictions Precautions Precautions: Fall;Other (comment) Precaution Comments: Aspiration Restrictions Weight Bearing Restrictions: No Pain Pain Assessment Pain Scale: 0-10 Pain Score: 0-No pain Pain Interference Pain Interference Pain Effect on Sleep: 0. Does not apply - I have not had any pain or hurting in the past 5 days Pain Interference with Therapy Activities: 1. Rarely or not at all Pain Interference with Day-to-Day Activities: 1. Rarely or not at all Home Living/Prior [place] Available Help at Discharge: Family;Available 24 hours/day Type of Home: House Home Access: Stairs to enter [employer] of Steps: 6 (back entrance has 6 STE w/ B HRs (typical entrance) otherwise 2 STE front entrance without HRs) Entrance Stairs-Rails: Right;Left Home Layout: Two level;Able to live on main level with bedroom/bathroom  Lives With: Spouse (husband, [name]) Prior Function Level of Independence: Independent with transfers;Independent with gait;Requires assistive device for independence (3-wheeled rollator used for ambulation and then used RW at sink for support)  Able to Take Stairs?: Yes (to get in/out of house) Driving: No Vision/Perception  Vision - History Ability to See in Adequate Light: 1 Impaired Perception Perception: Impaired Spatial Orientation: impaired awareness of midline with strong R anterior lean in standing Praxis Praxis: Impaired Praxis Impairment Details: Motor planning (noted significantly  during gait)  Cognition Overall Cognitive Status: Impaired/Different from baseline Arousal/Alertness: Awake/alert Orientation Level: Oriented X4 Year: 2023 Month: December Day of Week: Incorrect (stated it was Thursday) Attention: Focused;Sustained Focused Attention: Appears intact Sustained Attention: Impaired Sustained Attention Impairment: Verbal basic Memory: Impaired Awareness: Impaired Behaviors: Impulsive Safety/Judgment: Impaired Sensation Sensation Light Touch: Appears Intact Hot/Cold: Not tested Proprioception: Impaired by gross assessment (impaired B LE proprioception noted during gait with R LE scissoring and excessively large L LE step length) Stereognosis: Not tested Coordination Gross Motor Movements are Fluid and Coordinated: No Coordination and Movement Description: GM movements impaired due to L hemiparesis and R hemibody ataxia (LE>UE) Motor  Motor Motor: Hemiplegia;Ataxia;Abnormal postural alignment and control Motor - Skilled Clinical Observations: L hemiparesis and R hemibody ataxia with impaired midline orientation   Trunk/Postural Assessment  Cervical Assessment Cervical Assessment: Within Functional Limits Thoracic Assessment Thoracic Assessment: Exceptions to WFL (slight rounded shoulders) Lumbar Assessment Lumbar Assessment: Within Functional Limits Postural Control Postural Control: Deficits on evaluation Righting Reactions: delayed and inadequate with strong R anterior trunk lean in standing Protective Responses: delayed and inadequate  Balance Balance Balance Assessed: Yes Standardized Balance Assessment Standardized Balance Assessment: Berg Balance Test Berg Balance Test Sit to Stand: Needs moderate or maximal assist to stand Standing Unsupported: Unable to stand 30 seconds unassisted Sitting with Back Unsupported but Feet Supported on Floor or Stool: Able to sit 2 minutes under supervision Stand to Sit: Needs assistance to  sit Transfers: Needs one person to assist Standing Unsupported with Eyes Closed: Needs help to keep from falling Standing Ubsupported with Feet Together: Needs help to attain position and unable to hold for 15 seconds From Standing, Reach Forward with Outstretched Arm: Loses balance while trying/requires external support From Standing Position, Pick up Object from Floor: Unable to try/needs assist to keep balance From Standing Position, Turn to Look Behind Over each Shoulder: Needs assist to keep from losing balance and falling Turn 360 Degrees: Needs assistance while turning Standing Unsupported,  Alternately Place Feet on Step/Stool: Needs assistance to keep from falling or unable to try Standing Unsupported, One Foot in Front: Loses balance while stepping or standing Standing on One Leg: Unable to try or needs assist to prevent fall Total Score: 4 Static Sitting Balance Static Sitting - Balance Support: Feet supported Static Sitting - Level of Assistance: 5: Stand by assistance Dynamic Sitting Balance Dynamic Sitting - Balance Support: Feet supported Dynamic Sitting - Level of Assistance: Other (comment);4: Min assist (CGA) Dynamic Sitting - Balance Activities: Forward lean/weight shifting;Lateral lean/weight shifting Sitting balance - Comments: pt with 2x total L LOB over onto bed when trying to don LB clothing and frequent posterior trunk LOB Static Standing Balance Static Standing - Balance Support: During functional activity Static Standing - Level of Assistance: 3: Mod assist Dynamic Standing Balance Dynamic Standing - Balance Support: During functional activity Dynamic Standing - Level of Assistance: 2: Max assist Dynamic Standing - Balance Activities: Forward lean/weight shifting;Reaching for objects Extremity Assessment      RLE Assessment RLE Assessment: Within Functional Limits Active Range of Motion (AROM) Comments: WFL/WNL General Strength Comments: assessed in  supine RLE Strength Right Hip Flexion: 4+/5 Right Knee Flexion: 5/5 Right Knee Extension: 5/5 Right Ankle Dorsiflexion: 5/5 Right Ankle Plantar Flexion: 5/5 LLE Assessment LLE Assessment: Exceptions to [facility] Active Range of Motion (AROM) Comments: WFL/WNL General Strength Comments: assessed in supine LLE Strength Left Hip Flexion: 3+/5 Left Knee Flexion: 4-/5 Left Knee Extension: 4/5 Left Ankle Dorsiflexion: 4/5 Left Ankle Plantar Flexion: 4/5  Care Tool Care Tool Bed Mobility Roll left and right activity   Roll left and right assist level: Contact Guard/Touching assist    Sit to lying activity   Sit to lying assist level: Minimal Assistance - Patient > 75%    Lying to sitting on side of bed activity   Lying to sitting on side of bed assist level: the ability to move from lying on the back to sitting on the side of the bed with no back support.: Minimal Assistance - Patient > 75%     Care Tool Transfers Sit to stand transfer   Sit to stand assist level: Moderate Assistance - Patient 50 - 74%    Chair/bed transfer   Chair/bed transfer assist level: Moderate Assistance - Patient 50 - 74%     Psychologist, counselling transfer activity did not occur: Safety/medical concerns        Care Tool Locomotion Ambulation   Assist level: Maximal Assistance - Patient 25 - 49% Assistive device: Hand held assist Max distance: 45f  Walk 10 feet activity   Assist level: Maximal Assistance - Patient 25 - 49% Assistive device: Hand held assist   Walk 50 feet with 2 turns activity   Assist level: Maximal Assistance - Patient 25 - 49% Assistive device: Hand held assist  Walk 150 feet activity Walk 150 feet activity did not occur: Safety/medical concerns      Walk 10 feet on uneven surfaces activity Walk 10 feet on uneven surfaces activity did not occur: Safety/medical concerns      Stairs Stair activity did not occur: Safety/medical concerns        Walk  up/down 1 step activity Walk up/down 1 step or curb (drop down) activity did not occur: Safety/medical concerns      Walk up/down 4 steps activity Walk up/down 4 steps activity did not occur: Safety/medical concerns      Walk  up/down 12 steps activity Walk up/down 12 steps activity did not occur: Safety/medical concerns      Pick up small objects from floor   Pick up small object from the floor assist level: Dependent - Patient 0%    Wheelchair Is the patient using a wheelchair?: Yes (for transport) Type of Wheelchair: Manual   Wheelchair assist level: Dependent - Patient 0%    Wheel 50 feet with 2 turns activity   Assist Level: Dependent - Patient 0%  Wheel 150 feet activity   Assist Level: Dependent - Patient 0%    Refer to Care Plan for Long Term Goals  SHORT TERM GOAL WEEK 1 PT Short Term Goal 1 (Week 1): Pt will perform sit<>stands using LRAD with min assist PT Short Term Goal 2 (Week 1): Pt will perform bed<>chair transfers using LRAD with min assist PT Short Term Goal 3 (Week 1): Pt will ambulate at least 12f using LRAD with min assist PT Short Term Goal 4 (Week 1): Pt will perform 4 stair navigation using HRs with no more than mod assist PT Short Term Goal 5 (Week 1): Pt will improve score on Berg Balance Test by at least 7 points  Recommendations for other services: None   Skilled Therapeutic Intervention Pt received supine in bed with her husband, [name] present and pt agreeable to therapy session. Evaluation completed (see details above) with patient education regarding purpose of PT evaluation, PT POC and goals, therapy schedule, weekly team meetings, and other CIR information including safety plan and fall risk safety. Pt performed the below functional mobility tasks with the specified levels of skilled cuing and assistance. During 2nd gait training trial when using RW, pt reports onset of significant fatigue and knees start buckling requiring +2 assist to bring up  chair behind pt for seated rest break - pt has eyes closed and delayed verbal response to therapist's questions requiring ~10seconds for therapist to get pt to open her eyes and respond to questions but pt able to accurately state location, her name, and her husband's name - pt reports she was just really tired and that she "hit a wall" with her energy level. Assessed vitals: BP 120/69 (MAP 85), HR 118bpm, SpO2 100% After seated rest break, pt reports feeling much better and is hyper-verbose telling a store to therapist. Notified PA and nurse of this occurrence. At end of session, pt requesting to return to bed to rest. Pt left supine in bed with needs in reach, bed alarm on, and her husband present.   Mobility Bed Mobility Bed Mobility: Supine to Sit;Sit to Supine Supine to Sit: Minimal Assistance - Patient > 75% (for trunk control) Sit to Supine: Minimal Assistance - Patient > 75% (for trunk control) Transfers Transfers: Sit to Stand;Stand to Sit;Stand Pivot Transfers Sit to Stand: Moderate Assistance - Patient 50-74% Stand to Sit: Moderate Assistance - Patient 50-74% Stand Pivot Transfers: Moderate Assistance - Patient 50 - 74%;Maximal Assistance - Patient 25 - 49% Stand Pivot Transfer Details: Verbal cues for precautions/safety;Verbal cues for sequencing;Verbal cues for gait pattern;Verbal cues for technique;Tactile cues for weight shifting;Manual facilitation for weight shifting;Manual facilitation for placement;Tactile cues for sequencing;Tactile cues for posture Stand Pivot Transfer Details (indicate cue type and reason): strong R anterior trunk lean requiring heavy assist to maintain balance and cuing to maintain upright posture and step laterally Transfer (Assistive device): 1 person hand held assist Locomotion  Gait Ambulation: Yes Gait Assistance: Maximal Assistance - Patient 25-49% (therapist assisting on R side,  max assist to maintain balance due to STRONG R anterior lean) Gait  Distance (Feet): 70 Feet (+45f using RW with heavy mod progressed to max assist due to continued gait deviations as noted below and pt also frequently having L hand slip off RW handle) Assistive device: 1 person hand held assist (used RW on 2nd gait trial) Gait Assistance Details: Manual facilitation for placement;Manual facilitation for weight shifting;Verbal cues for gait pattern;Verbal cues for technique;Visual cues/gestures for precautions/safety;Visual cues/gestures for sequencing;Verbal cues for sequencing;Tactile cues for sequencing;Tactile cues for posture;Tactile cues for weight shifting;Tactile cues for placement;Verbal cues for precautions/safety;Manual facilitation for weight bearing Gait Assistance Details: requries heavy assistance throughout to maintain balance due to R anterior trunk lean Gait Gait: Yes Gait Pattern: Impaired Gait Pattern: Narrow base of support;Scissoring;Step-through pattern (takes excessively long forward steps, R LE scissors heavily, persistent R anterior trunk LOB, poor awareness of body and LE positioning) Gait velocity: decreased Stairs / Additional Locomotion Stairs: No Wheelchair Mobility Wheelchair Mobility: No   Discharge Criteria: Patient will be discharged from PT if patient refuses treatment 3 consecutive times without medical reason, if treatment goals not met, if there is a change in medical status, if patient makes no progress towards goals or if patient is discharged from hospital.  The above assessment, treatment plan, treatment alternatives and goals were discussed and mutually agreed upon: by patient and by family  CTawana Scale, PT, DPT, NCS, CSRS 11/28/2022, 8:53 AM

## 2022-11-28 NOTE — Progress Notes (Signed)
PROGRESS NOTE   Subjective/Complaints: Has been more fatigued. Has not had BM in a few days. PPI was changed to PO yesterday.  IV team had trouble placing IV yesterday.    Review of Systems  Constitutional:  Negative for fever.  Respiratory:  Negative for shortness of breath.   Cardiovascular:  Negative for chest pain.  Gastrointestinal:  Positive for constipation.  Musculoskeletal:  Positive for back pain.  Neurological:  Positive for speech change and focal weakness.  Psychiatric/Behavioral:  Positive for depression.     Objective:   No results found. Recent Labs    11/27/22 1541 11/28/22 0533  WBC 10.5 9.6  HGB 8.0* 8.2*  HCT 24.8* 25.1*  PLT 427* 430*   Recent Labs    11/27/22 0554 11/27/22 1541 11/28/22 0533  NA 143  --  142  K 3.6  --  3.2*  CL 115*  --  107  CO2 22  --  25  GLUCOSE 89  --  79  BUN <5*  --  <5*  CREATININE 1.28* 1.28* 1.11*  CALCIUM 8.4*  --  9.6    Intake/Output Summary (Last 24 hours) at 11/28/2022 1436 Last data filed at 11/28/2022 1200 Gross per 24 hour  Intake 650 ml  Output 400 ml  Net 250 ml        Physical Exam: Vital Signs Blood pressure (!) 120/59, pulse 87, temperature 97.8 F (36.6 C), resp. rate 16, SpO2 97 %.   General: No apparent distress HEENT: Head is normocephalic, atraumatic,  oral mucosa pink and moist Neck: Supple without JVD or lymphadenopathy Heart: Reg rate and rhythm. No murmurs rubs or gallops Chest: CTA bilaterally without wheezes, rales, or rhonchi; no distress Abdomen: Soft, non-tender, non-distended, bowel sounds positive. Extremities: No clubbing, cyanosis, left upper extremity edema pulses are 2+ Psych: Pt's affect is appropriate. Pt is cooperative pleasant Skin: warm and dry Neuro: Alert and oriented to place, name, month and year and situation.  Follows commands.  Left facial droop.  EOMI.  PERRL Sensation intact light touch in all 4  extremities Strength 5 out of 5 in right lower extremities and right upper extremity Strength 4+ out of 5 left lower extremity Strength 3 out of 5 proximal left upper extremity to 4- out of 5 distal right upper extremity Musculoskeletal: No joint swelling or tenderness noted, no abnormal range of motion noted   Assessment/Plan: 1. Functional deficits which require 3+ hours per day of interdisciplinary therapy in a comprehensive inpatient rehab setting. Physiatrist is providing close team supervision and 24 hour management of active medical problems listed below. Physiatrist and rehab team continue to assess barriers to discharge/monitor patient progress toward functional and medical goals  Care Tool:  Bathing              Bathing assist       Upper Body Dressing/Undressing Upper body dressing        Upper body assist      Lower Body Dressing/Undressing Lower body dressing            Lower body assist       Toileting Toileting    Toileting assist  Transfers Chair/bed transfer  Transfers assist           Locomotion Ambulation   Ambulation assist              Walk 10 feet activity   Assist           Walk 50 feet activity   Assist           Walk 150 feet activity   Assist           Walk 10 feet on uneven surface  activity   Assist           Wheelchair     Assist               Wheelchair 50 feet with 2 turns activity    Assist            Wheelchair 150 feet activity     Assist          Blood pressure (!) 120/59, pulse 87, temperature 97.8 F (36.6 C), resp. rate 16, SpO2 97 %.  Medical Problem List and Plan: 1. 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  9 mm right occipital pole infarct.  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             -patient may  shower             -ELOS/Goals: 10-14 days             -Continue CIR, PT/OT/SLP 2.  Antithrombotics: -DVT/anticoagulation:  Pharmaceutical: Lovenox             -antiplatelet therapy: Aspirin 81 mg daily and Plavix 75 mg daily x 21 days then Plavix alone 3. Pain Management: Neurontin 300 mg 3 times daily at home. Will decrease to 300 BID based on pharmacy recommendation.  Tylenol as needed 4. Mood/Behavior/Sleep: Cymbalta 30 mg daily provide emotional support             -antipsychotic agents: N/A 5. Neuropsych/cognition: This patient is capable of making decisions on her own behalf. 6. Skin/Wound Care: Routine skin checks 7. Fluids/Electrolytes/Nutrition: Routine in and outs with follow-up chemistries 8.  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  Latest creatinine improved 1.28.  Follow-up chemistries 9.  Aspiration pneumonia finishing course of Augmentin 10.  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  Follow-up outpatient as needed.  Follow-up CT scan with IV contrast if possible in a few weeks.  If still present consider MRI. 11.  Hypothyroidism.  Synthroid 12.  Chronic diastolic congestive heart failure.  Monitor for any signs of fluid overload. Monitor weight  There were no vitals filed for this visit.  13.  Iron deficiency anemia.  Follow-up CBC.  Continue Niferex daily 14.  History of asthma.  Continue inhalers as directed as well as chronic prednisone 10 mg daily 15.  Hyperlipidemia.  Lipitor 40 mg 16.  Hypertension.  Will hold off on restarting Norvasc 10 mg daily, Inderal 60 mg daily. Consider restarting if needed.  Losartan stopped due to AKI.  Well-controlled overall.  Monitor with increased   -12/22 BP stable, continue to hold antihypertensives 17.  Abdominal pain and coffee-ground emesis.  Evaluated by GI no procedures recommended.  Continue pantoprazole and follow-up with GI  outpatient.  May need outpatient EGD. 18.  Hypomagnesemia.  Recheck level  -12/22 1.7 today, add 1 tab slow mg daily 19.  Hypokalemia.  Recheck level 20. Constipation              -Start miralax BID  -12/22 sorbitol ordered 21. Fatigue  -Check orthostatic VS, Ted hose 22. Hypokalemia  -12/22 Replete with KCL, recheck    LOS: 1 days A FACE TO FACE EVALUATION WAS PERFORMED  [name] 11/28/2022, 2:36 PM

## 2022-11-28 NOTE — Plan of Care (Signed)
  Problem: RH Balance Goal: LTG Patient will maintain dynamic standing with ADLs (OT) Description: LTG:  Patient will maintain dynamic standing balance with assist during activities of daily living (OT)  11/28/2022 1644 by Alessandra Bevels, OT Flowsheets (Taken 11/28/2022 1644) LTG: Pt will maintain dynamic standing balance during ADLs with: Supervision/Verbal cueing 11/28/2022 1638 by Alessandra Bevels, OT  Problem: RH Bathing Goal: LTG Patient will bathe all body parts with assist levels (OT) Description: LTG: Patient will bathe all body parts with assist levels (OT) 11/28/2022 1644 by Alessandra Bevels, OT Flowsheets (Taken 11/28/2022 1644) LTG: Pt will perform bathing with assistance level/cueing: Supervision/Verbal cueing 11/28/2022 1638 by Alessandra Bevels, OT    Problem: RH Dressing Goal: LTG Patient will perform lower body dressing w/assist (OT) Description: LTG: Patient will perform lower body dressing with assist, with/without cues in positioning using equipment (OT) 11/28/2022 1644 by Alessandra Bevels, OT  LTG: Pt will perform lower body dressing with assistance level of: Supervision/Verbal cueing 11/28/2022 1638 by Alessandra Bevels, OT  Problem: RH Toileting Goal: LTG Patient will perform toileting task (3/3 steps) with assistance level (OT) Description: LTG: Patient will perform toileting task (3/3 steps) with assistance level (OT)  11/28/2022 1644 by Alessandra Bevels, OT Flowsheets (Taken 11/28/2022 1644) LTG: Pt will perform toileting task (3/3 steps) with assistance level: Supervision/Verbal cueing 11/28/2022 1638 by Alessandra Bevels, OT  Problem: RH Tub/Shower Transfers Goal: LTG Patient will perform tub/shower transfers w/assist (OT) Description: LTG: Patient will perform tub/shower transfers with assist, with/without cues using equipment (OT) 11/28/2022 1644 by Alessandra Bevels, OT Flowsheets (Taken 11/28/2022  1644) LTG: Pt will perform tub/shower stall transfers with assistance level of: Supervision/Verbal cueing     Problem: RH Memory Goal: LTG Patient will demonstrate ability for day to day recall/carry over during activities of daily living with assistance level (OT) Description: LTG:  Patient will demonstrate ability for day to day recall/carry over during activities of daily living with assistance level (OT). 11/28/2022 1644 by Alessandra Bevels, OT Flowsheets (Taken 11/28/2022 1644) LTG:  Patient will demonstrate ability for day to day recall/carry over during activities of daily living with assistance level (OT): Supervision 11/28/2022 1638 by Alessandra Bevels, OT

## 2022-11-28 NOTE — Progress Notes (Signed)
Inpatient Rehabilitation  Patient information reviewed and entered into eRehab system by Malakhai Beitler Alanta Scobey, OTR/L, Rehab Quality Coordinator.   Information including medical coding, functional ability and quality indicators will be reviewed and updated through discharge.   

## 2022-11-28 NOTE — Evaluation (Signed)
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 Active Problems:   Hypomagnesemia  Past Medical History:  Past Medical History:  Diagnosis Date   Anemia    Arthritis    Asthma    uses inhaler just prior to surgery to avoid attack   Back pain    from previous injury   Complication of anesthesia    has woken  up during 2 different surgery   Depression    no current issue/treatment; situation   Gallstones    GERD (gastroesophageal reflux disease)    Hiatal hernia    patient does NOT have nerve/muscle disease   History of kidney stones    HLD (hyperlipidemia)    HTN (hypertension)    Hypothyroidism    Kidney stones    Knee pain    Non-diabetic pancreatic hormone dysfunction years   pt. 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   Pancreatitis    Pneumonia    Seizures ([place])    caused by dye injected during a procedure   Shortness of breath    with exertion   Sinus problem    frequent infections/congestion   Stroke ([place]) 2021   reports having CVA in 2021 and having mini strokes before that   Thyroid disease    Past Surgical History:  Past Surgical History:  Procedure Laterality Date   ABDOMINAL HYSTERECTOMY     APPENDECTOMY     CARPAL TUNNEL RELEASE  10+ years ago   bilateral   EYE SURGERY  3 yrs ago   bilateral cataracts   FOOT OSTEOTOMY  6 weeks ago   Left foot: great, 2nd & 3rd   FOOT OSTEOTOMY  5 years ago   Right great toe   HAND SURGERY Bilateral 2011-most recent   multiple hand surgeries, 2 on left, 3 on right   KNEE ARTHROPLASTY Right 04/28/2022   Procedure: COMPUTER  ASSISTED TOTAL KNEE ARTHROPLASTY;  Surgeon: Dereck Leep, MD;  Location: [facility];  Service: Orthopedics;  Laterality: Right;   LOOP RECORDER INSERTION N/A 05/16/2020   Procedure: LOOP RECORDER INSERTION;  Surgeon: Isaias Cowman, MD;  Location: [facility];  Service: Cardiovascular;  Laterality: N/A;   NASAL SINUS SURGERY  most recent 7-8 yrs ago   7 sinus surgeries    TRIGGER FINGER RELEASE  11/19/2011   Procedure: RELEASE TRIGGER FINGER/A-1 PULLEY;  Surgeon: Wynonia Sours, MD;  Location: [place];  Service: Orthopedics;  Laterality: Right;  release a-1 pulley right index finger and cyst removal   WRIST GANGLION EXCISION  1980's   right    Assessment / Plan / Recommendation Clinical Impression Meisha Salone Ziomek is an 81 year old right-handed female with history of recent stroke with left-sided weakness maintained on aspirin and Plavix/loop recorder placement received inpatient rehab services 08/22/2022 - 09/05/2022 discharge to home ambulating 150 feet with a rollator, hypertension, hyperlipidemia, asthma, pancreatitis, chronic anemia, diastolic congestive heart failure.  Per chart review patient lives with spouse.  Two-level home bed and bath main level 2 steps to entry.  [facility]ed [facility] 11/23/2022 with increasing left-sided weakness nausea vomiting nonspecific abdominal pain.  Cranial CT scan negative for  acute changes.  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  New compared to 08/19/2022.  No other intracranial arterial occlusion or high-grade stenosis.  Patient did not receive tPA.  MRI of the brain showed extensive acute nonhemorrhagic infarct involving the posterior inferior cerebellum bilaterally right greater than left.  9 mm right occipital pole infarct.  Punctate white matter infarct in the left occipital lobe.  Additional punctate cortical infarct in the medial right  occipital lobe more superiorly.  Echocardiogram with ejection fraction of 55 to 60% no wall motion abnormalities grade 1 diastolic dysfunction.  CT abdomen pelvis showed a 8.4 cm masslike inflammatory/edematous changes in the anterior pelvic fat cephalad to the urinary bladder, possibly contusion versus neoplasm.  Admission chemistries unremarkable except glucose 179 creatinine 1.79, urine drug screen negative, troponin negative, blood cultures no growth to date hemoglobin 10.5, WBC 11,600.  Neurology follow-up initially placed on IV heparin and currently remains on aspirin and Plavix for CVA prophylaxis x 21 days then Plavix alone.  Lovenox added for DVT prophylaxis.  Gastroenterology services consulted Dr. Virgina Jock in regards to coffee-ground emesis with CT abdomen pelvis reviewed not felt to be GI bleed consider CTA if worsening but no plan for endoscopy or further workup at this time.  Patient was cleared to continue aspirin and Plavix therapy.  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  AKI improved with gentle IV fluids latest creatinine 1.28.  Question aspiration pneumonia initially on Unasyn and transition to Augmentin finishing course.  Cardiology interrogated loop recorder and no events noted. Tolerating a regular consistency diet.  Therapy evaluations completed due to patient's decreased functional mobility left-sided weakness was admitted for a comprehensive rehab program.   Pt is known to this service and was last seen in this setting in September '23 s/p R CVA. Pt discharged home at supervision level with support from her spouse. Pt/spouse feel pt is near cognitive baseline however with increased short-term memory deficits. Pt presents today with at least mild cognitive-linguistic impairment with primary deficits noted in attention, short-term memory, and problem solving skills. Due to these functional deficits, pt required min-to-mod A cues for cognitive task  completion. Pt benefited from extended processing time and verbal repetition which appeared attributed to decreased attention related deficits vs. auditory comprehension impairment. Expression language was perceived as grossly intact with no evidence of word finding difficulty as noted in acute care. Speech was fluent and without clinical signs of dysarthria. Pt was hyperverbal and perseverative and required frequent verbal redirection. Pt reported concern of depression and would benefit from a consult with neuropsych (referral in place). [facility] Mental Status Examination was completed to evaluate the pt's cognitive-linguistic skills. Pt achieved a score of 15/30 which is below the normal limits of 27 or more out of 30. Skilled ST intervention is recommended to address cognitive-linguistic deficits to maximize functional independence.    Skilled Therapeutic Interventions          Pt participated in [place] Status Examination (SLUMS) as well as further non-standardized assessments of cognitive-linguistic, speech, and language function. Please see above.    SLP Assessment  Patient will need skilled [facility] during CIR admission    Recommendations  Patient destination: Home Follow up Recommendations: Other (comment);Home Health SLP;Outpatient SLP (at least intermittent supervision) Equipment Recommended: None recommended by SLP  SLP Frequency 3 to 5 out of 7 days   SLP Duration  SLP Intensity  SLP Treatment/Interventions 2.5-3 weeks (anticipate shorter for SLP)  Minumum of 1-2 x/day, 30 to 90 minutes  Cognitive remediation/compensation;Internal/external aids;Environmental controls;Functional tasks;Patient/family education;Therapeutic Activities    Pain Pain Assessment Pain Scale: 0-10 Pain Score: 0-No pain  Prior Functioning Cognitive/Linguistic Baseline: Baseline deficits Baseline deficit details: last seen by SLP in  september '23 with mild cognitive deficits and discharged at supervision level Type of Home: House  Lives With: Spouse Available Help at Discharge: Family;Available 24 hours/day  SLP Evaluation Cognition Overall Cognitive Status: Impaired/Different from baseline Arousal/Alertness: Awake/alert Orientation Level: Oriented X4 Year: 2023 Month: December Day of Week: Correct Attention: Sustained Focused Attention: Appears intact Sustained Attention: Impaired Sustained Attention Impairment: Verbal basic Memory: Impaired Memory Impairment: Decreased recall of new information;Storage deficit;Retrieval deficit;Decreased short term memory Decreased Short Term Memory: Verbal basic Awareness: Impaired Awareness Impairment: Emergent impairment Problem Solving: Impaired Problem Solving Impairment: Verbal complex Behaviors: Perseveration Safety/Judgment: Impaired  Comprehension Auditory Comprehension Overall Auditory Comprehension: Impaired EffectiveTechniques: Extra processing time;Repetition Expression Expression Primary Mode of Expression: Verbal Verbal Expression Overall Verbal Expression: Appears within functional limits for tasks assessed Written Expression Dominant Hand: Right Oral Motor Oral Motor/Sensory Function Overall Oral Motor/Sensory Function: Mild impairment Facial ROM: Reduced left (residual impairment from previous CVA) Facial Symmetry: Abnormal symmetry left Facial Sensation: Within Functional Limits Lingual ROM: Within Functional Limits Lingual Symmetry: Within Functional Limits Lingual Strength: Within Functional Limits Lingual Sensation: Within Functional Limits Velum: Within Functional Limits Mandible: Within Functional Limits Motor Speech Overall Motor Speech: Appears within functional limits for tasks assessed Articulation: Within functional limitis Intelligibility: Intelligible  Care Tool Care Tool Cognition Ability to hear (with hearing aid or  hearing appliances if normally used Ability to hear (with hearing aid or hearing appliances if normally used): 1. Minimal difficulty - difficulty in some environments (e.g. when person speaks softly or setting is noisy)   Expression of Ideas and Wants Expression of Ideas and Wants: 3. Some difficulty - exhibits some difficulty with expressing needs and ideas (e.g, some words or finishing thoughts) or speech is not clear   Understanding Verbal and Non-Verbal Content Understanding Verbal and Non-Verbal Content: 3. Usually understands - understands most conversations, but misses some part/intent of message. Requires cues at times to understand  Memory/Recall Ability Memory/Recall Ability : Current season;That he or she is in a hospital/hospital unit   Intelligibility: Intelligible  Short Term Goals: Week 1: SLP Short Term Goal 1 (Week 1): Pt will sustain attention to functional tasks for 10 minute intervals with min A verbal redirection cues SLP Short Term Goal 2 (Week 1): Pt will recall and verbally describe at least 3 compensatory memory strategies with min A verbal cues SLP Short Term Goal 3 (Week 1): Pt will demonstrate use of compensatory memory strategies during functional tasks with min A verbal cues SLP Short Term Goal 4 (Week 1): Pt will complete mildly complex cognitive tasks with min A verbal cues for effective problem solving  Refer to Care Plan for Long Term Goals  Recommendations for other services: Neuropsych  Discharge Criteria: Patient will be discharged from SLP if patient refuses treatment 3 consecutive times without medical reason, if treatment goals not met, if there is a change in medical status, if patient makes no progress towards goals or if patient is discharged from hospital.  The above assessment, treatment plan, treatment alternatives and goals were discussed and mutually agreed upon: by patient and by  family  [name] 11/28/2022, 4:39 PM

## 2022-11-29 DIAGNOSIS — Z8673 Personal history of transient ischemic attack (TIA), and cerebral infarction without residual deficits: Secondary | ICD-10-CM

## 2022-11-29 DIAGNOSIS — R52 Pain, unspecified: Secondary | ICD-10-CM

## 2022-11-29 LAB — BASIC METABOLIC PANEL
Anion gap: 8 (ref 5–15)
BUN: 8 mg/dL (ref 8–23)
CO2: 23 mmol/L (ref 22–32)
Calcium: 9.6 mg/dL (ref 8.9–10.3)
Chloride: 109 mmol/L (ref 98–111)
Creatinine, Ser: 1.03 mg/dL — ABNORMAL HIGH (ref 0.44–1.00)
GFR, Estimated: 55 mL/min — ABNORMAL LOW (ref >60)
Glucose, Bld: 93 mg/dL (ref 70–99)
Potassium: 3.5 mmol/L (ref 3.5–5.1)
Sodium: 140 mmol/L (ref 135–145)

## 2022-11-29 NOTE — Plan of Care (Signed)
  Problem: Consults Goal: RH STROKE PATIENT EDUCATION Description: See Patient Education module for education specifics  Outcome: Progressing   Problem: RH BOWEL ELIMINATION Goal: RH STG MANAGE BOWEL WITH ASSISTANCE Description: STG Manage Bowel with mod I Assistance. Outcome: Progressing Goal: RH STG MANAGE BOWEL W/MEDICATION W/ASSISTANCE Description: STG Manage Bowel with Medication with mod I Assistance. Outcome: Progressing   Problem: RH BLADDER ELIMINATION Goal: RH STG MANAGE BLADDER WITH ASSISTANCE Description: STG Manage Bladder With toileting Assistance Outcome: Progressing   Problem: RH SAFETY Goal: RH STG ADHERE TO SAFETY PRECAUTIONS W/ASSISTANCE/DEVICE Description: STG Adhere to Safety Precautions With cues Assistance/Device. Outcome: Progressing   Problem: RH KNOWLEDGE DEFICIT Goal: RH STG INCREASE KNOWLEDGE OF DIABETES Description: Patient and spouse will be able to manage DM with dietary modifications using educational handouts independently Outcome: Progressing Goal: RH STG INCREASE KNOWLEDGE OF HYPERTENSION Description: Patient and spouse will be able to manage HTN with  medications dietary modifications using educational handouts independently Outcome: Progressing Goal: RH STG INCREASE KNOWLEGDE OF HYPERLIPIDEMIA Description: Patient and spouse will be able to manage HLD with  medications dietary modifications using educational handouts independently Outcome: Progressing Goal: RH STG INCREASE KNOWLEDGE OF STROKE PROPHYLAXIS Description: Patient and spouse will be able to manage secondary risks with  medications dietary modifications using educational handouts independently Outcome: Progressing   Problem: RH KNOWLEDGE DEFICIT Goal: RH STG INCREASE KNOWLEDGE OF DIABETES Description: Patient and spouse will be able to manage DM with dietary modifications using educational handouts independently Outcome: Progressing Goal: RH STG INCREASE KNOWLEDGE OF  HYPERTENSION Description: Patient and spouse will be able to manage HTN with  medications dietary modifications using educational handouts independently Outcome: Progressing Goal: RH STG INCREASE KNOWLEGDE OF HYPERLIPIDEMIA Description: Patient and spouse will be able to manage HLD with  medications dietary modifications using educational handouts independently Outcome: Progressing Goal: RH STG INCREASE KNOWLEDGE OF STROKE PROPHYLAXIS Description: Patient and spouse will be able to manage secondary risks with  medications dietary modifications using educational handouts independently Outcome: Progressing

## 2022-11-29 NOTE — Progress Notes (Signed)
SLP Cancellation Note  Patient Details Name: [name] MRN: [id number] DOB: 11-25-1941  Cancelled treatment: Pt missed 30 minutes of skilled ST intervention due to personal circumstances pertaining to therapist during scheduled time (1030-11). SLP attempted to see pt later in the afternoon (1540) however pt was sleeping soundly and difficult to wake despite multiple attempts by SLP and spouse. Spouse reported pt did not sleep well last night due to multiple diarrhea episodes. Pt left sleeping with spouse at bedside. Will attempt to make up time as schedule allows.   [name] 11/29/2022, 5:21 PM

## 2022-11-29 NOTE — Progress Notes (Signed)
PROGRESS NOTE   Subjective/Complaints: Having loose stool- discussed stopping the miralax and magnesium and she is agreeable. Will add magnesium level Monday as she notes she was low before   Review of Systems  Constitutional:  Negative for fever.  Respiratory:  Negative for shortness of breath.   Cardiovascular:  Negative for chest pain.  Gastrointestinal:  Positive for diarrhea. Negative for constipation.  Musculoskeletal:  Positive for back pain.  Neurological:  Positive for speech change and focal weakness.  Psychiatric/Behavioral:  Positive for depression.     Objective:   No results found. Recent Labs    11/27/22 1541 11/28/22 0533  WBC 10.5 9.6  HGB 8.0* 8.2*  HCT 24.8* 25.1*  PLT 427* 430*    Recent Labs    11/28/22 0533 11/29/22 0537  NA 142 140  K 3.2* 3.5  CL 107 109  CO2 25 23  GLUCOSE 79 93  BUN <5* 8  CREATININE 1.11* 1.03*  CALCIUM 9.6 9.6     Intake/Output Summary (Last 24 hours) at 11/29/2022 2141 Last data filed at 11/29/2022 1838 Gross per 24 hour  Intake 716 ml  Output 2 ml  Net 714 ml         Physical Exam: Vital Signs Blood pressure (!) 149/66, pulse (!) 102, temperature 98.4 F (36.9 C), temperature source Oral, resp. rate 18, weight 50.6 kg, SpO2 98 %.   General: No apparent distress HEENT: Head is normocephalic, atraumatic,  oral mucosa pink and moist Neck: Supple without JVD or lymphadenopathy Heart: Tachycardia Chest: CTA bilaterally without wheezes, rales, or rhonchi; no distress Abdomen: Soft, non-tender, non-distended, bowel sounds positive. Extremities: No clubbing, cyanosis, left upper extremity edema pulses are 2+ Psych: Pt's affect is appropriate. Pt is cooperative pleasant Skin: warm and dry Neuro: Alert and oriented to place, name, month and year and situation.  Follows commands.  Left facial droop.  EOMI.  PERRL Sensation intact light touch in all 4  extremities Strength 5 out of 5 in right lower extremities and right upper extremity Strength 4+ out of 5 left lower extremity Strength 3 out of 5 proximal left upper extremity to 4- out of 5 distal right upper extremity Musculoskeletal: No joint swelling or tenderness noted, no abnormal range of motion noted   Assessment/Plan: 1. Functional deficits which require 3+ hours per day of interdisciplinary therapy in a comprehensive inpatient rehab setting. Physiatrist is providing close team supervision and 24 hour management of active medical problems listed below. Physiatrist and rehab team continue to assess barriers to discharge/monitor patient progress toward functional and medical goals  Care Tool:  Bathing    Body parts bathed by patient: Right arm, Left arm, Chest, Abdomen, Face, Right upper leg, Left upper leg, Front perineal area, Buttocks, Right lower leg, Left lower leg         Bathing assist Assist Level: Minimal Assistance - Patient > 75% (For standing balance.)     Upper Body Dressing/Undressing Upper body dressing   What is the patient wearing?: Pull over shirt    Upper body assist Assist Level: Set up assist    Lower Body Dressing/Undressing Lower body dressing      What  is the patient wearing?: Underwear/pull up, Pants     Lower body assist Assist for lower body dressing: Minimal Assistance - Patient > 75% (For standing balance.)     Toileting Toileting    Toileting assist Assist for toileting: Minimal Assistance - Patient > 75%     Transfers Chair/bed transfer  Transfers assist     Chair/bed transfer assist level: Moderate Assistance - Patient 50 - 74%     Locomotion Ambulation   Ambulation assist      Assist level: Maximal Assistance - Patient 25 - 49% Assistive device: Hand held assist Max distance: 60f   Walk 10 feet activity   Assist     Assist level: Maximal Assistance - Patient 25 - 49% Assistive device: Hand held assist    Walk 50 feet activity   Assist    Assist level: Maximal Assistance - Patient 25 - 49% Assistive device: Hand held assist    Walk 150 feet activity   Assist Walk 150 feet activity did not occur: Safety/medical concerns         Walk 10 feet on uneven surface  activity   Assist Walk 10 feet on uneven surfaces activity did not occur: Safety/medical concerns         Wheelchair     Assist Is the patient using a wheelchair?: Yes (for transport) Type of Wheelchair: Manual    Wheelchair assist level: Dependent - Patient 0%      Wheelchair 50 feet with 2 turns activity    Assist        Assist Level: Dependent - Patient 0%   Wheelchair 150 feet activity     Assist      Assist Level: Dependent - Patient 0%   Blood pressure (!) 149/66, pulse (!) 102, temperature 98.4 F (36.9 C), temperature source Oral, resp. rate 18, weight 50.6 kg, SpO2 98 %.  Medical Problem List and Plan: 1. 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  9 mm right occipital pole infarct.  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             -patient may  shower             -ELOS/Goals: 10-14 days            Continue CIR, PT/OT/SLP 2.  Antithrombotics: -DVT/anticoagulation:  Pharmaceutical: Lovenox             -antiplatelet therapy: Aspirin 81 mg daily and Plavix 75 mg daily x 21 days then Plavix alone 3. Pain Management: Neurontin 300 mg 3 times daily at home. Will decrease to 300 BID based on pharmacy recommendation.  Tylenol as needed 4. Mood/Behavior/Sleep: Cymbalta 30 mg daily provide emotional support             -antipsychotic agents: N/A 5. Neuropsych/cognition: This patient is capable of making decisions on her own behalf. 6. Skin/Wound Care:  Routine skin checks 7. Fluids/Electrolytes/Nutrition: Routine in and outs with follow-up chemistries 8.  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  Latest creatinine improved 1.28.  Follow-up chemistries 9.  Aspiration pneumonia finishing course of Augmentin 10.  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  Follow-up outpatient as needed.  Follow-up CT scan with IV contrast if possible in a few weeks.  If still present consider MRI. 11.  Hypothyroidism.  Synthroid  12.  Chronic diastolic congestive heart failure.  Monitor for any signs of fluid overload. Monitor weight   Filed Weights   11/29/22 0626  Weight: 50.6 kg    13.  Iron deficiency anemia.  Follow-up CBC.  Continue Niferex daily 14.  History of asthma.  Continue inhalers as directed as well as chronic prednisone 10 mg daily 15.  Hyperlipidemia.  Lipitor 40 mg 16.  Hypertension.  Will hold off on restarting Norvasc 10 mg daily, Inderal 60 mg daily. Consider restarting if needed.  Losartan stopped due to AKI.  Well-controlled overall.  Monitor with increased   -12/22 BP stable, continue to hold antihypertensives 17.  Abdominal pain and coffee-ground emesis.  Evaluated by GI no procedures recommended.  Continue pantoprazole and follow-up with GI outpatient.  May need outpatient EGD. 18.  Hypomagnesemia.  Recheck level  -12/22 1.7 today, add 1 tab slow mg daily  12/23: added repeat check for monday 19.  Hypokalemia.  Recheck level 20. Constipation: resolved, stool now loose, discussed initial constipation could have been from iron 21. Fatigue  -Check orthostatic VS, Ted hose 22. Hypokalemia  -12/22 Replete with KCL, recheck 23. Loose stool: d/c miralax    LOS: 2 days A FACE TO FACE EVALUATION WAS PERFORMED  Izora Ribas 11/29/2022, 9:41 PM

## 2022-11-29 NOTE — Progress Notes (Signed)
Occupational Therapy Note  Patient Details  Name: [name] MRN: [id number] Date of Birth: 12-19-1940  Today's Date: 11/29/2022 Missed Time: 45 min  OT attempted to see Pt for skilled therapy session. OT sleeping, difficult to wake d/t not sleeping well last night and fatigued following previous therapy sessions. OT left sleeping in bed with husband present in room.    [name] 11/29/2022, 3:24 PM

## 2022-11-29 NOTE — Progress Notes (Signed)
Physical Therapy Session Note  Patient Details  Name: [name] MRN: [id number] Date of Birth: 1941/11/13  Today's Date: 11/29/2022 PT Individual Time: 1302-1359 PT Individual Time Calculation (min): 57 min   Short Term Goals: Week 1:  PT Short Term Goal 1 (Week 1): Pt will perform sit<>stands using LRAD with min assist PT Short Term Goal 2 (Week 1): Pt will perform bed<>chair transfers using LRAD with min assist PT Short Term Goal 3 (Week 1): Pt will ambulate at least 69f using LRAD with min assist PT Short Term Goal 4 (Week 1): Pt will perform 4 stair navigation using HRs with no more than mod assist PT Short Term Goal 5 (Week 1): Pt will improve score on Berg Balance Test by at least 7 points  Skilled Therapeutic Interventions/Progress Updates:     [facility]. No complaint of pain. WC transport to gym for time management. Pt performs sit to stand and stand step transfer to mat with minA and cues for initiation, sequencing, and positioning. Pt performs sit to stand from mat with CGA and PT provides cues to stand with feet shoulder width apart. Pt then performs x20 alternating marches in place with CGA/minA with cues for posture and maintaining on task as pt is very internally and externally distracted, as well as hyperverbose. Pt takes seated rest break. Pt then begins to perform Berg balance test, during test pt begins to lose balance more and more frequently, and appears to become more anxious as her performance suffers. PT provides multiple cues to redirect pt to task and to attempt to narrow pt's focus, but pt has difficulty following instructions and continues to have frequent LOBs. PT eventually terminates berg test and [facility] verbalizes fatigue and is known to have impaired performance when fatigued.   Pt stands to ambulate and has immediate LOB backward, abruptly sitting back on WC. PT provides  extensive cueing on importance of focusing on task and pt attempts again, this time able to stand and ambulate x120' with minA/modA at hips with PT positioned on R and pt resting R upper extremity on PT. Cues provided for posture, sequencing of turns, redirecting to task and pursed lip breathing. WC transport back to room. Stand step to bed with minA. Sit to supine with cues for positioning. Left with alarm intact and all needs within reach.  Therapy Documentation Precautions:  Precautions Precautions: Fall Precaution Comments: Aspiration Restrictions Weight Bearing Restrictions: (P) No   Therapy/Group: Individual Therapy  WBreck Coons PT, DPT 11/29/2022, 3:52 PM

## 2022-11-29 NOTE — Progress Notes (Signed)
Occupational Therapy Session Note  Patient Details  Name: [name] MRN: [id number] Date of Birth: 01-Mar-1941  Today's Date: 11/29/2022 OT Individual Time: 0930-1030 OT Individual Time Calculation (min): 60 min    Short Term Goals: Week 1:  OT Short Term Goal 1 (Week 1): Pt will perform 3/3 toileting activities with CGA + LRAD. OT Short Term Goal 2 (Week 1): Pt will perform bathing activities with supervision + Min safety cues. OT Short Term Goal 3 (Week 1): Pt will perform full body dressing with supervision + Min safety cues.  Skilled Therapeutic Interventions/Progress Updates:     Pt received in bed with no pain reported, but had fatigue from rough night  ADL: Pt completes ADL at overall MIN-MOD Level. Skilled interventions include: funcitnal mobility with cuing to place LUE on walker for mobility, steadying to  complete clothing management, and min A to change pad. Pt completes grooming at sink with min HOH A to use LUE in grooming and make up application with significantly increased time.  Therapeutic activity Pt sits at high low table and shares about feeling/experiences after first stroke, struggling to cope with change and feelings of sadness. Discussed neuropsych consult to address coping. Pt appreciative.   Box and Blocks Test measures unilateral gross manual dexterity. - Instructions The pt was instructed to carry one block over at a time and go as quickly as they could, making sure their fingertips crossed the partition. One minute was given to complete the task per UE. The pt was allowed a 15-second trial period prior to testing if needed. - Results The pt transferred 38 blocks with the R hand and 3 with the L hand. The total number of blocks carried from one compartment to the other in one minute is scored per hand. Higher scores on the test indicate better gross manual dexterity.  - Norms for adults females 50-75+ 50-54 R 77.7 L 74.3 55-59 R 74.7 L 73.6 60-64 R  76.1 L 73.6 65-69 R 72 L 71.3 70-74 R 68.6 L 68.3 75+ R 65.0 L 63.6   Pt left at end of session in bed with exit alarm on, call light in reach and all needs met   Therapy Documentation Precautions:  Precautions Precautions: Fall Precaution Comments: Aspiration Restrictions Weight Bearing Restrictions: No General:   Exercises:   Other Treatments:     Therapy/Group: Individual Therapy  Tonny Branch 11/29/2022, 6:32 AM

## 2022-11-30 DIAGNOSIS — Z8673 Personal history of transient ischemic attack (TIA), and cerebral infarction without residual deficits: Secondary | ICD-10-CM | POA: Diagnosis not present

## 2022-11-30 NOTE — Progress Notes (Signed)
Physical Therapy Session Note  Patient Details  Name: [name] MRN: [id number] Date of Birth: 1941-08-22  Today's Date: 11/30/2022 PT Individual Time: 1117-1202 PT Individual Time Calculation (min): 45 min   Short Term Goals: Week 1:  PT Short Term Goal 1 (Week 1): Pt will perform sit<>stands using LRAD with min assist PT Short Term Goal 2 (Week 1): Pt will perform bed<>chair transfers using LRAD with min assist PT Short Term Goal 3 (Week 1): Pt will ambulate at least 81f using LRAD with min assist PT Short Term Goal 4 (Week 1): Pt will perform 4 stair navigation using HRs with no more than mod assist PT Short Term Goal 5 (Week 1): Pt will improve score on Berg Balance Test by at least 7 points  Skilled Therapeutic Interventions/Progress Updates:    Chart reviewed and pt agreeable to therapy. Pt received semi-reclined in bed with no c/o pain. Session focused on functional mobility, amb endurance, and balance to promote safe home access. Pt initiated session with SST to WC with CGA + RW. Pt then compelte 3x30sec of balance exercises include normal stance, feet together, normal stance + eyes closed, and R/L staggered stance. Pt noted to need CGA for all activities but MinA for L staggered stance. Pt then educated on balance recovery through PT. Pt then amb [id number] hallways with CGA + RW. Pt noted increasing L knee pain related to planned future surgery. Pt and PT returned to room to further discussed PT trajectory for stroke recovery. At end of session, [facility] nurse call bell and all needs in reach.     Therapy Documentation Precautions:  Precautions Precautions: Fall Precaution Comments: Aspiration Restrictions Weight Bearing Restrictions: No    Therapy/Group: Individual Therapy  KiMarquette OldPT, DPT 11/30/2022, 12:41 PM

## 2022-11-30 NOTE — Progress Notes (Signed)
Physical Therapy Session Note  Patient Details  Name: [name] MRN: [id number] Date of Birth: [birth year]  Today's Date: 11/30/2022 PT Individual Time: 1535-1630 PT Individual Time Calculation (min): 55 min   Short Term Goals: Week 1:  PT Short Term Goal 1 (Week 1): Pt will perform sit<>stands using LRAD with min assist PT Short Term Goal 2 (Week 1): Pt will perform bed<>chair transfers using LRAD with min assist PT Short Term Goal 3 (Week 1): Pt will ambulate at least 48f using LRAD with min assist PT Short Term Goal 4 (Week 1): Pt will perform 4 stair navigation using HRs with no more than mod assist PT Short Term Goal 5 (Week 1): Pt will improve score on Berg Balance Test by at least 7 points  Skilled Therapeutic Interventions/Progress Updates:   Pt received sitting in WC and agreeable to PT. [facility] performed gait training with no AD x 1550fwith min assist and cues for improved posture to prevent anterior LOB. PT instructed pt in modied otago level A in parallel bars with cues for full ROM and BUE support on rails throughout. [place] level B performed in parallel bars except figure 8 wealking performed with HHA from PT on the L side. Therapeutic rest break throughout session. Pt then reporting to this PT that she had been seeing people in room that were not present and reports that she has been experiencing this for some time, but unable to define time line. Pt returned to room and asked husband if he was aware of this, and he said that I was when she was on acute care hospital, but did not know this was still present. Pt and wife question this PT on possiblity of medication causing hallucinations. PT defferred this to medical team. Pt then reporting that she has also been hearing things from hallucinations. RN made aware who notified MD. Pt left bed with call bell in reach.            Therapy Documentation Precautions:  Precautions Precautions:  Fall Precaution Comments: Aspiration Restrictions Weight Bearing Restrictions: No  Vital Signs: Therapy Vitals Temp: 98 F (36.7 C) Pulse Rate: (!) 102 Resp: 16 BP: (!) 142/56 Patient Position (if appropriate): Sitting Oxygen Therapy SpO2: 100 % O2 Device: Room Air Pain: denies    Therapy/Group: Individual Therapy  AuLorie Phenix2/24/2023, 3:30 PM

## 2022-11-30 NOTE — Progress Notes (Signed)
Occupational Therapy Session Note  Patient Details  Name: [name] MRN: [id number] Date of Birth: 1941/04/20  Today's Date: 11/30/2022 OT Individual Time: 0915-1000 OT Individual Time Calculation (min): 45 min    Short Term Goals: Week 1:  OT Short Term Goal 1 (Week 1): Pt will perform 3/3 toileting activities with CGA + LRAD. OT Short Term Goal 2 (Week 1): Pt will perform bathing activities with supervision + Min safety cues. OT Short Term Goal 3 (Week 1): Pt will perform full body dressing with supervision + Min safety cues.  Skilled Therapeutic Interventions/Progress Updates:    Pt received in bed with spouse present. Pt sat to EOB with CGA. Spent first part of session working on controlled sit to stands focusing on foot alignment and spacing, forward weight shift and engaging her core once she rises to stand.  Pt did well following cues for movement but continues to need cues to move slowly and in steps as she tends to rush through the movement patterns causing her to lose her balance.  With practice and repetition, pt able to sit to stand several times with close S and then placed hands on RW.  Pt needed to toilet, pt ambulated with RW to toilet with min A.  Cues to not rush the steps. Pt able to complete steps of toileting with guarding A.  Ambulated to sink to complete oral care in standing. She did get fatigued post standing several minutes.  From EOB, she worked on Building surveyor with min A overall.  Static standing holds then moved to working on more dynamic standing.    Pt resting in bed at end of session with all needs met. Alarm set.     Therapy Documentation Precautions:  Precautions Precautions: Fall Precaution Comments: Aspiration Restrictions Weight Bearing Restrictions: No  Pain:  No c/o pain         Therapy/Group: Individual Therapy  Chakita Mcgraw 11/30/2022, 8:02 AM

## 2022-11-30 NOTE — Progress Notes (Signed)
Speech Language Pathology Daily Session Note  Patient Details  Name: [name] MRN: [id number] Date of Birth: February 17, 1941  Today's Date: 11/30/2022 SLP Individual Time: 1345-1425 SLP Individual Time Calculation (min): 40 min  Short Term Goals: Week 1: SLP Short Term Goal 1 (Week 1): Pt will sustain attention to functional tasks for 10 minute intervals with min A verbal redirection cues SLP Short Term Goal 2 (Week 1): Pt will recall and verbally describe at least 3 compensatory memory strategies with min A verbal cues SLP Short Term Goal 3 (Week 1): Pt will demonstrate use of compensatory memory strategies during functional tasks with min A verbal cues SLP Short Term Goal 4 (Week 1): Pt will complete mildly complex cognitive tasks with min A verbal cues for effective problem solving  Skilled Therapeutic Interventions: Skilled ST treatment focused on cognitive goals. Pt was greeted upright in wheelchair on arrival and reported feeling tired today. Pt requested for stroke education to better understand her deficits. SLP educated particularly on the cognitive-communication changes that may be experienced with a CVA and validated that much of what pt is going through is out of her control. SLP spoke generally in terms of physical impairments. Pt had a tendency to blame herself feel that she is a burden to her husband and family. Pt verbalized understanding of stroke education through teach back and appeared to have an increased intellectual and emergent awareness of deficits following discussion as compared to previous encounter. Pt sustained attention for duration of session with min A verbal redirection cues with improved topic maintenance. A large portion of session was dedicated toward coping with cognitive and emotional changes discussion on stress management techniques. Initiated discussion on compensatory memory strategies but further efforts are needed due to time constraints. Patient was left in  bed with alarm activated and immediate needs within reach at end of session. Continue per current plan of care.      Pain Pain Assessment Pain Scale: 0-10 Pain Type: Chronic pain Pain Location: Knee Pain Orientation: Left Pain Descriptors / Indicators: Sharp Patients Stated Pain Goal: 0 Pain Intervention(s): Medication (See eMAR);Repositioned;Ambulation/increased activityNone/denied  Therapy/Group: Individual Therapy  [name] 11/30/2022, 4:03 PM

## 2022-11-30 NOTE — IPOC Note (Signed)
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 Active Problems:   Hypomagnesemia     Functional Problem List: Nursing Bladder, Safety, Endurance, Medication Management, Bowel  PT Balance, Perception, Behavior, Safety, Edema, Sensory, Endurance, Skin Integrity, Motor, Nutrition, Pain  OT Balance, Cognition, Edema, Pain, Motor, Safety, Vision  SLP Cognition  TR         Basic ADL's: OT Bathing, Dressing, Toileting     Advanced  ADL's: OT       Transfers: PT Bed Mobility, Bed to Chair, Car, Furniture, Floor  OT Toilet, Metallurgist: PT Ambulation, Stairs     Additional Impairments: OT Fuctional Use of Upper Extremity  SLP Social Cognition, Communication comprehension Problem Solving, Memory, Attention  TR      Anticipated Outcomes Item Anticipated Outcome  Self Feeding    Swallowing      Basic self-care  Sup  Toileting  Sup   Bathroom Transfers Sup  Bowel/Bladder  manage bowel w mod I and bladder w toileting  Transfers  CGA using LRAD  Locomotion  CGA using LRAD  Communication     Cognition  sup A  Pain  n/a  Safety/Judgment  manage w cues   Therapy Plan: PT Intensity: Minimum of 1-2 x/day ,45 to 90 minutes PT Frequency: 5 out of 7 days PT Duration Estimated Length of Stay: ~ 3 weeks OT Intensity: Minimum of 1-2 x/day, 45 to 90 minutes OT Frequency: 5 out of 7 days OT Duration/Estimated Length of Stay: 10-12 days SLP Intensity: Minumum of 1-2 x/day, 30 to 90 minutes SLP Frequency: 3 to 5 out of 7 days SLP Duration/Estimated Length of Stay: 2.5-3 weeks (anticipate shorter for SLP)   Team Interventions: Nursing Interventions Bladder Management, Patient/Family Education, Disease Management/Prevention, Discharge  Planning, Bowel Management, Medication Management  PT interventions Ambulation/gait training, DME/adaptive equipment instruction, Community reintegration, Neuromuscular re-education, IT trainer, Psychosocial support, UE/LE Strength taining/ROM, Wheelchair propulsion/positioning, Training and development officer, Discharge planning, Functional electrical stimulation, Skin care/wound management, Pain management, Therapeutic Activities, UE/LE Coordination activities, Cognitive remediation/compensation, Disease management/prevention, Functional mobility training, Patient/family education, Splinting/orthotics, Therapeutic Exercise, Visual/perceptual remediation/compensation  OT Interventions Balance/vestibular training, Cognitive remediation/compensation, Discharge planning, DME/adaptive equipment instruction, Functional mobility training, Neuromuscular re-education, Pain management, Patient/family education, Self Care/advanced ADL retraining, Therapeutic Activities, Therapeutic Exercise, UE/LE Strength taining/ROM, UE/LE Coordination activities, Visual/perceptual remediation/compensation  SLP Interventions Cognitive remediation/compensation, Internal/external aids, Environmental controls, Functional tasks, Patient/family education, Therapeutic Activities  TR Interventions    SW/CM Interventions Discharge Planning, Psychosocial Support, Patient/Family Education   Barriers to Discharge MD  Medical stability  Nursing Decreased caregiver support, Home environment access/layout 2 level main B+B , 2 ste left rail w spouse; recent CVA, has RW, 3N1, BSC and SS  PT Home environment access/layout    OT Home environment access/layout    SLP      SW Insurance for SNF coverage     Team Discharge Planning: Destination: PT-Home ,OT- Home , SLP-Home Projected Follow-up: PT-Outpatient PT, 24 hour supervision/assistance, OT-  Home health OT, SLP-Other (comment), Home Health SLP, Outpatient SLP (at least intermittent  supervision) Projected Equipment Needs: PT-To be determined, OT- To be determined, SLP-None recommended by SLP Equipment Details: PT- , OT-  Patient/family involved in discharge planning: PT- Patient, Family member/caregiver,  OT-Patient, Family member/caregiver, SLP-Patient, Family member/caregiver  MD ELOS: 10-14 days Medical Rehab  Prognosis:  Excellent Assessment: The patient has been admitted for CIR therapies with the diagnosis of nonhemorrhagic infarcts of the posterior inferior cerebellum bilaterally. The team will be addressing functional mobility, strength, stamina, balance, safety, adaptive techniques and equipment, self-care, bowel and bladder mgt, patient and caregiver education. Goals have been set at supervision. Anticipated discharge destination is home.        See Team Conference Notes for weekly updates to the plan of care

## 2022-11-30 NOTE — Progress Notes (Signed)
PROGRESS NOTE   Subjective/Complaints: No new complaints from patient or her husband today but therapy notes that she has been having auditory and visual hallucinations and feel she would benefit from seeing neuropsych   Review of Systems  Constitutional:  Negative for chills and fever.  Respiratory:  Negative for shortness of breath.   Cardiovascular:  Negative for chest pain.  Gastrointestinal:  Positive for diarrhea. Negative for constipation.  Musculoskeletal:  Positive for back pain.  Neurological:  Positive for speech change and focal weakness.  Psychiatric/Behavioral:  Positive for depression.     Objective:   No results found. Recent Labs    11/28/22 0533  WBC 9.6  HGB 8.2*  HCT 25.1*  PLT 430*    Recent Labs    11/28/22 0533 11/29/22 0537  NA 142 140  K 3.2* 3.5  CL 107 109  CO2 25 23  GLUCOSE 79 93  BUN <5* 8  CREATININE 1.11* 1.03*  CALCIUM 9.6 9.6     Intake/Output Summary (Last 24 hours) at 11/30/2022 1840 Last data filed at 11/30/2022 1237 Gross per 24 hour  Intake 357 ml  Output --  Net 357 ml         Physical Exam: Vital Signs Blood pressure (!) 142/56, pulse (!) 102, temperature 98 F (36.7 C), resp. rate 16, weight 49.8 kg, SpO2 100 %.   General: No apparent distress HEENT: Head is normocephalic, atraumatic,  oral mucosa pink and moist Neck: Supple without JVD or lymphadenopathy Heart: Tachycardia Chest: CTA bilaterally without wheezes, rales, or rhonchi; no distress Abdomen: Soft, non-tender, non-distended, bowel sounds positive. Extremities: No clubbing, cyanosis, left upper extremity edema pulses are 2+ Psych: Pt's affect is appropriate. Pt is cooperative pleasant, +hallucinations Skin: warm and dry Neuro: Alert and oriented to place, name, month and year and situation.  Follows commands.  Left facial droop.  EOMI.  PERRL Sensation intact light touch in all 4  extremities Strength 5 out of 5 in right lower extremities and right upper extremity Strength 4+ out of 5 left lower extremity Strength 3 out of 5 proximal left upper extremity to 4- out of 5 distal right upper extremity Musculoskeletal: No joint swelling or tenderness noted, no abnormal range of motion noted   Assessment/Plan: 1. Functional deficits which require 3+ hours per day of interdisciplinary therapy in a comprehensive inpatient rehab setting. Physiatrist is providing close team supervision and 24 hour management of active medical problems listed below. Physiatrist and rehab team continue to assess barriers to discharge/monitor patient progress toward functional and medical goals  Care Tool:  Bathing    Body parts bathed by patient: Right arm, Left arm, Chest, Abdomen, Face, Right upper leg, Left upper leg, Front perineal area, Buttocks, Right lower leg, Left lower leg         Bathing assist Assist Level: Minimal Assistance - Patient > 75% (For standing balance.)     Upper Body Dressing/Undressing Upper body dressing   What is the patient wearing?: Pull over shirt    Upper body assist Assist Level: Set up assist    Lower Body Dressing/Undressing Lower body dressing      What is the patient  wearing?: Underwear/pull up, Pants     Lower body assist Assist for lower body dressing: Minimal Assistance - Patient > 75% (For standing balance.)     Toileting Toileting    Toileting assist Assist for toileting: Minimal Assistance - Patient > 75%     Transfers Chair/bed transfer  Transfers assist     Chair/bed transfer assist level: Moderate Assistance - Patient 50 - 74%     Locomotion Ambulation   Ambulation assist      Assist level: Maximal Assistance - Patient 25 - 49% Assistive device: Hand held assist Max distance: 66f   Walk 10 feet activity   Assist     Assist level: Maximal Assistance - Patient 25 - 49% Assistive device: Hand held assist    Walk 50 feet activity   Assist    Assist level: Maximal Assistance - Patient 25 - 49% Assistive device: Hand held assist    Walk 150 feet activity   Assist Walk 150 feet activity did not occur: Safety/medical concerns         Walk 10 feet on uneven surface  activity   Assist Walk 10 feet on uneven surfaces activity did not occur: Safety/medical concerns         Wheelchair     Assist Is the patient using a wheelchair?: Yes (for transport) Type of Wheelchair: Manual    Wheelchair assist level: Dependent - Patient 0%      Wheelchair 50 feet with 2 turns activity    Assist        Assist Level: Dependent - Patient 0%   Wheelchair 150 feet activity     Assist      Assist Level: Dependent - Patient 0%   Blood pressure (!) 142/56, pulse (!) 102, temperature 98 F (36.7 C), resp. rate 16, weight 49.8 kg, SpO2 100 %.  Medical Problem List and Plan: 1. 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  9 mm right occipital pole infarct.  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             -patient may  shower             -ELOS/Goals: 10-14 days          Continue CIR, PT/OT/SLP 2.  Antithrombotics: -DVT/anticoagulation:  Pharmaceutical: Lovenox             -antiplatelet therapy: Aspirin 81 mg daily and Plavix 75 mg daily x 21 days then Plavix alone 3. Pain Management: Neurontin 300 mg 3 times daily at home. Will decrease to 300 BID based on pharmacy recommendation.  Tylenol as needed 4. Mood/Behavior/Sleep: Cymbalta 30 mg daily provide emotional support             -antipsychotic agents: N/A 5. Neuropsych/cognition: This patient is capable of making decisions on her own behalf. 6. Skin/Wound Care: Routine skin checks 7.  Fluids/Electrolytes/Nutrition: Routine in and outs with follow-up chemistries 8.  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  Latest creatinine improved 1.28.  Follow-up chemistries 9.  Aspiration pneumonia finishing course of Augmentin 10.  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  Follow-up outpatient as needed.  Follow-up CT scan with IV contrast if possible in a few weeks.  If still present consider MRI. 11.  Hypothyroidism.  Synthroid 12.  Chronic diastolic congestive heart failure.  Monitor for any signs of fluid overload. Monitor weight   Filed Weights   11/29/22 0626 11/30/22 0522  Weight: 50.6 kg 49.8 kg    13.  Iron deficiency anemia.  Follow-up CBC.  Continue Niferex daily 14.  History of asthma.  Continue inhalers as directed as well as chronic prednisone 10 mg daily 15.  Hyperlipidemia.  Lipitor 40 mg 16.  Hypertension.  Will hold off on restarting Norvasc 10 mg daily, Inderal 60 mg daily. Consider restarting if needed.  Losartan stopped due to AKI.  Well-controlled overall.  Monitor with increased   -12/22 BP stable, continue to hold antihypertensives 17.  Abdominal pain and coffee-ground emesis.  Evaluated by GI no procedures recommended.  Continue pantoprazole and follow-up with GI outpatient.  May need outpatient EGD. 18.  Hypomagnesemia.  Recheck level  -12/22 1.7 today, add 1 tab slow mg daily  12/23: added repeat check for monday 19.  Hypokalemia.  Recheck level 20. Constipation: resolved, stool now loose, discussed initial constipation could have been from iron 21. Fatigue  -Check orthostatic VS, Ted hose 22. Hypokalemia  -12/22 Replete with KCL, improved to 3.5, monitor weekly 23. Loose stool: d/c miralax, resolved. D/c magnesium.  24. Hallucinations: UA/UC ordered.     LOS: 3 days A FACE TO FACE EVALUATION WAS PERFORMED  Izora Ribas 11/30/2022, 6:40 PM

## 2022-12-01 DIAGNOSIS — Z8673 Personal history of transient ischemic attack (TIA), and cerebral infarction without residual deficits: Secondary | ICD-10-CM | POA: Diagnosis not present

## 2022-12-01 DIAGNOSIS — E876 Hypokalemia: Secondary | ICD-10-CM | POA: Diagnosis not present

## 2022-12-01 DIAGNOSIS — I10 Essential (primary) hypertension: Secondary | ICD-10-CM | POA: Diagnosis not present

## 2022-12-01 DIAGNOSIS — R443 Hallucinations, unspecified: Secondary | ICD-10-CM

## 2022-12-01 LAB — URINALYSIS, ROUTINE W REFLEX MICROSCOPIC
Bilirubin Urine: NEGATIVE
Glucose, UA: NEGATIVE mg/dL
Hgb urine dipstick: NEGATIVE
Ketones, ur: NEGATIVE mg/dL
Nitrite: NEGATIVE
Protein, ur: NEGATIVE mg/dL
Specific Gravity, Urine: 1.012 (ref 1.005–1.030)
pH: 5 (ref 5.0–8.0)

## 2022-12-01 LAB — MAGNESIUM: Magnesium: 1.8 mg/dL (ref 1.7–2.4)

## 2022-12-01 MED ORDER — PROPRANOLOL HCL ER 60 MG PO CP24
60.0000 mg | ORAL_CAPSULE | Freq: Every day | ORAL | Status: DC
  Administered 2022-12-01 – 2022-12-03 (×3): 60 mg via ORAL
  Filled 2022-12-01 (×3): qty 1

## 2022-12-01 NOTE — Progress Notes (Incomplete)
Occupational Therapy Session Note  Patient Details  Name: PEBBLE BOTKIN MRN: [id number] Date of Birth: 02/21/1941  {CHL IP REHAB OT TIME CALCULATIONS:[id number]}   Short Term Goals: Week 1:  OT Short Term Goal 1 (Week 1): Pt will perform 3/3 toileting activities with CGA + LRAD. OT Short Term Goal 2 (Week 1): Pt will perform bathing activities with supervision + Min safety cues. OT Short Term Goal 3 (Week 1): Pt will perform full body dressing with supervision + Min safety cues.  Skilled Therapeutic Interventions/Progress Updates:  Pt received *** for skilled OT session with focus on ***. Pt agreeable to interventions, demonstrating overall *** mood. Pt reported ***/10 pain, stating "***" in reference to ***. OT offering intermediate rest breaks and positioning suggestions throughout session to address pain/fatigue and maximize participation/safety in session.    Pt remained *** with all immediate needs met at end of session. Pt continues to be appropriate for skilled OT intervention to promote further functional independence.    Therapy Documentation Precautions:  Precautions Precautions: Fall Precaution Comments: Aspiration Restrictions Weight Bearing Restrictions: No    Therapy/Group: Individual Therapy  Maudie Mercury, OTR/L, MSOT  12/01/2022, 8:01 PM

## 2022-12-01 NOTE — Plan of Care (Signed)
  Problem: Consults Goal: RH STROKE PATIENT EDUCATION Description: See Patient Education module for education specifics  Outcome: Not Progressing   Problem: RH BOWEL ELIMINATION Goal: RH STG MANAGE BOWEL WITH ASSISTANCE Description: STG Manage Bowel with mod I Assistance. Outcome: Not Progressing Goal: RH STG MANAGE BOWEL W/MEDICATION W/ASSISTANCE Description: STG Manage Bowel with Medication with mod I Assistance. Outcome: Not Progressing   Problem: RH BLADDER ELIMINATION Goal: RH STG MANAGE BLADDER WITH ASSISTANCE Description: STG Manage Bladder With toileting Assistance Outcome: Not Progressing   Problem: RH SAFETY Goal: RH STG ADHERE TO SAFETY PRECAUTIONS W/ASSISTANCE/DEVICE Description: STG Adhere to Safety Precautions With cues Assistance/Device. Outcome: Not Progressing   Problem: RH KNOWLEDGE DEFICIT Goal: RH STG INCREASE KNOWLEDGE OF DIABETES Description: Patient and spouse will be able to manage DM with dietary modifications using educational handouts independently Outcome: Not Progressing Goal: RH STG INCREASE KNOWLEDGE OF HYPERTENSION Description: Patient and spouse will be able to manage HTN with  medications dietary modifications using educational handouts independently Outcome: Not Progressing Goal: RH STG INCREASE KNOWLEGDE OF HYPERLIPIDEMIA Description: Patient and spouse will be able to manage HLD with  medications dietary modifications using educational handouts independently Outcome: Not Progressing Goal: RH STG INCREASE KNOWLEDGE OF STROKE PROPHYLAXIS Description: Patient and spouse will be able to manage secondary risks with  medications dietary modifications using educational handouts independently Outcome: Not Progressing   Problem: Education: Goal: Knowledge of disease or condition will improve Outcome: Not Progressing Goal: Knowledge of secondary prevention will improve (MUST DOCUMENT ALL) Outcome: Not Progressing Goal: Knowledge of patient specific  risk factors will improve [place] N/A or DELETE if not current risk factor) Outcome: Not Progressing   Problem: Ischemic Stroke/TIA Tissue Perfusion: Goal: Complications of ischemic stroke/TIA will be minimized Outcome: Not Progressing   Problem: Coping: Goal: Will verbalize positive feelings about self Outcome: Not Progressing Goal: Will identify appropriate support needs Outcome: Not Progressing   Problem: Health Behavior/Discharge Planning: Goal: Ability to manage health-related needs will improve Outcome: Not Progressing Goal: Goals will be collaboratively established with patient/family Outcome: Not Progressing   Problem: Self-Care: Goal: Ability to participate in self-care as condition permits will improve Outcome: Not Progressing Goal: Verbalization of feelings and concerns over difficulty with self-care will improve Outcome: Not Progressing Goal: Ability to communicate needs accurately will improve Outcome: Not Progressing   Problem: Nutrition: Goal: Risk of aspiration will decrease Outcome: Not Progressing Goal: Dietary intake will improve Outcome: Not Progressing

## 2022-12-01 NOTE — Progress Notes (Signed)
PROGRESS NOTE   Subjective/Complaints: Reports no hallucinations today, she says she has had this a few times in the past. Previously this happened when she was on steroids.  We discussed CVA risk reduction.    Review of Systems  Constitutional:  Negative for chills and fever.  Respiratory:  Negative for shortness of breath.   Cardiovascular:  Negative for chest pain.  Gastrointestinal:  Positive for diarrhea. Negative for constipation.  Genitourinary:  Negative for dysuria, frequency and urgency.  Musculoskeletal:  Positive for back pain.  Neurological:  Positive for speech change and focal weakness.  Psychiatric/Behavioral:  Positive for depression and hallucinations.     Objective:   No results found. No results for input(s): "WBC", "HGB", "HCT", "PLT" in the last 72 hours.  Recent Labs    11/29/22 0537  NA 140  K 3.5  CL 109  CO2 23  GLUCOSE 93  BUN 8  CREATININE 1.03*  CALCIUM 9.6     Intake/Output Summary (Last 24 hours) at 12/01/2022 1451 Last data filed at 12/01/2022 1200 Gross per 24 hour  Intake 1123 ml  Output --  Net 1123 ml         Physical Exam: Vital Signs Blood pressure (!) 146/75, pulse 94, temperature 98.2 F (36.8 C), temperature source Oral, resp. rate 15, height '4\' 11"'$  (1.499 m), weight 50.8 kg, SpO2 98 %.   General: No apparent distress HEENT: Head is normocephalic, atraumatic,  oral mucosa pink and moist Neck: Supple without JVD or lymphadenopathy Heart: RRR Chest: CTA bilaterally without wheezes, rales, or rhonchi; no distress Abdomen: Soft, non-tender, non-distended, bowel sounds positive. Extremities: No clubbing, cyanosis, left upper extremity edema pulses are 2+ Psych: Pt's affect is appropriate. Pt is cooperative pleasant, +hallucinations- resolved today Skin: warm and dry Neuro: Alert and oriented to place, name, month and year and situation.  Follows commands.  Left  facial droop.  EOMI.  PERRL Sensation intact light touch in all 4 extremities Strength 5 out of 5 in right lower extremities and right upper extremity Strength 4+ out of 5 left lower extremity Strength 3 out of 5 proximal left upper extremity to 4- out of 5 distal right upper extremity Musculoskeletal: No joint swelling or tenderness noted, no abnormal range of motion noted   Assessment/Plan: 1. Functional deficits which require 3+ hours per day of interdisciplinary therapy in a comprehensive inpatient rehab setting. Physiatrist is providing close team supervision and 24 hour management of active medical problems listed below. Physiatrist and rehab team continue to assess barriers to discharge/monitor patient progress toward functional and medical goals  Care Tool:  Bathing    Body parts bathed by patient: Right arm, Left arm, Chest, Abdomen, Face, Right upper leg, Left upper leg, Front perineal area, Buttocks, Right lower leg, Left lower leg         Bathing assist Assist Level: Minimal Assistance - Patient > 75% (For standing balance.)     Upper Body Dressing/Undressing Upper body dressing   What is the patient wearing?: Pull over shirt    Upper body assist Assist Level: Set up assist    Lower Body Dressing/Undressing Lower body dressing  What is the patient wearing?: Underwear/pull up, Pants     Lower body assist Assist for lower body dressing: Minimal Assistance - Patient > 75% (For standing balance.)     Toileting Toileting    Toileting assist Assist for toileting: Minimal Assistance - Patient > 75%     Transfers Chair/bed transfer  Transfers assist     Chair/bed transfer assist level: Moderate Assistance - Patient 50 - 74%     Locomotion Ambulation   Ambulation assist      Assist level: Maximal Assistance - Patient 25 - 49% Assistive device: Hand held assist Max distance: 79f   Walk 10 feet activity   Assist     Assist level: Maximal  Assistance - Patient 25 - 49% Assistive device: Hand held assist   Walk 50 feet activity   Assist    Assist level: Maximal Assistance - Patient 25 - 49% Assistive device: Hand held assist    Walk 150 feet activity   Assist Walk 150 feet activity did not occur: Safety/medical concerns         Walk 10 feet on uneven surface  activity   Assist Walk 10 feet on uneven surfaces activity did not occur: Safety/medical concerns         Wheelchair     Assist Is the patient using a wheelchair?: Yes (for transport) Type of Wheelchair: Manual    Wheelchair assist level: Dependent - Patient 0%      Wheelchair 50 feet with 2 turns activity    Assist        Assist Level: Dependent - Patient 0%   Wheelchair 150 feet activity     Assist      Assist Level: Dependent - Patient 0%   Blood pressure (!) 146/75, pulse 94, temperature 98.2 F (36.8 C), temperature source Oral, resp. rate 15, height '4\' 11"'$  (1.499 m), weight 50.8 kg, SpO2 98 %.  Medical Problem List and Plan: 1. 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  9 mm right occipital pole infarct.  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             -patient may  shower             -ELOS/Goals: 10-14 days          Continue CIR, PT/OT/SLP  -DC IV 2.  Antithrombotics: -DVT/anticoagulation:  Pharmaceutical: Lovenox             -antiplatelet therapy: Aspirin 81 mg daily and Plavix 75 mg daily x 21 days then Plavix alone 3. Pain Management: Neurontin 300 mg 3 times daily at home. Will decrease to 300 BID based on pharmacy recommendation.  Tylenol as needed 4. Mood/Behavior/Sleep: Cymbalta 30 mg daily provide emotional support             -antipsychotic agents: N/A 5.  Neuropsych/cognition: This patient is capable of making decisions on her own behalf. 6. Skin/Wound Care: Routine skin checks 7. Fluids/Electrolytes/Nutrition: Routine in and outs with follow-up chemistries 8.  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  Latest creatinine improved 1.28.  Follow-up chemistries 9.  Aspiration pneumonia finishing course of Augmentin 10.  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  Follow-up outpatient as needed.  Follow-up CT scan with IV contrast if possible in a few weeks.  If still present consider  MRI. 11.  Hypothyroidism.  Synthroid 12.  Chronic diastolic congestive heart failure.  Monitor for any signs of fluid overload. Monitor weight -12/25 weight appears stable, no signs overload   Filed Weights   11/29/22 0626 11/30/22 0522 12/01/22 0558  Weight: 50.6 kg 49.8 kg 50.8 kg    13.  Iron deficiency anemia.  Follow-up CBC.  Continue Niferex daily 14.  History of asthma.  Continue inhalers as directed as well as chronic prednisone 10 mg daily 15.  Hyperlipidemia.  Lipitor 40 mg 16.  Hypertension.  Will hold off on restarting Norvasc 10 mg daily, Inderal 60 mg daily. Consider restarting if needed.  Losartan stopped due to AKI.  Well-controlled overall.  Monitor with increased   -12/26 Restart inderal '60mg'$      12/01/2022    1:55 PM 12/01/2022    9:20 AM 12/01/2022    9:09 AM  Vitals with BMI  Height  '4\' 11"'$    Systolic 820  601  Diastolic 75  74  Pulse 94  84    17.  Abdominal pain and coffee-ground emesis.  Evaluated by GI no procedures recommended.  Continue pantoprazole and follow-up with GI outpatient.  May need outpatient EGD. 18.  Hypomagnesemia.  Recheck level  -12/22 1.7 today, add 1 tab slow mg daily  12/25 MG 1.8 today stable 19.  Hypokalemia.  Recheck level 20. Constipation: resolved, stool now loose, discussed initial constipation could have been from iron 21. Fatigue  -Check orthostatic VS, Ted hose 22. Hypokalemia  -12/22 Replete with  KCL, improved to 3.5 -12/25 recheck tomorrow 23. Loose stool: d/c miralax, resolved. D/c magnesium.  24. Hallucinations: UA/UC ordered.   -U/A with WBC, neg nitrite, will follow culture results, She denies dysuria and hallucinations have improved    LOS: 4 days A FACE TO FACE EVALUATION WAS PERFORMED  Jennye Boroughs 12/01/2022, 2:51 PM

## 2022-12-02 ENCOUNTER — Ambulatory Visit: Payer: Medicare HMO

## 2022-12-02 ENCOUNTER — Ambulatory Visit: Payer: Medicare HMO | Admitting: Occupational Therapy

## 2022-12-02 DIAGNOSIS — I639 Cerebral infarction, unspecified: Secondary | ICD-10-CM | POA: Diagnosis not present

## 2022-12-02 LAB — BASIC METABOLIC PANEL
Anion gap: 5 (ref 5–15)
BUN: 17 mg/dL (ref 8–23)
CO2: 27 mmol/L (ref 22–32)
Calcium: 9.3 mg/dL (ref 8.9–10.3)
Chloride: 103 mmol/L (ref 98–111)
Creatinine, Ser: 1.28 mg/dL — ABNORMAL HIGH (ref 0.44–1.00)
GFR, Estimated: 42 mL/min — ABNORMAL LOW (ref >60)
Glucose, Bld: 108 mg/dL — ABNORMAL HIGH (ref 70–99)
Potassium: 3.7 mmol/L (ref 3.5–5.1)
Sodium: 135 mmol/L (ref 135–145)

## 2022-12-02 NOTE — Progress Notes (Signed)
Physical Therapy Session Note  Patient Details  Name: [name] MRN: [id number] Date of Birth: [birth year]  Today's Date: 12/02/2022 PT Individual Time: 0808-0906 PT Individual Time Calculation (min): 58 min   Short Term Goals: Week 1:  PT Short Term Goal 1 (Week 1): Pt will perform sit<>stands using LRAD with min assist PT Short Term Goal 2 (Week 1): Pt will perform bed<>chair transfers using LRAD with min assist PT Short Term Goal 3 (Week 1): Pt will ambulate at least 64f using LRAD with min assist PT Short Term Goal 4 (Week 1): Pt will perform 4 stair navigation using HRs with no more than mod assist PT Short Term Goal 5 (Week 1): Pt will improve score on Berg Balance Test by at least 7 points  Skilled Therapeutic Interventions/Progress Updates:    Pt received supine in bed with MD exiting upon therapist arrival and pt's husband, [name] present. Pt agreeable to therapy session although reports feeling "depressed" this morning, pt reports MD is aware. Supine>sitting L EOB, HOB only slightly elevated to simulate home, with close supervision for safety.   Sitting EOB, pt doffed dirty shirt, donned clean shirt, underwear, and pants with close supervision/CGA - pt with 1x total L LOB lying over on the bed and able to recover to upright with CGA for safety. Sit<>stands EOB<>RW with min assist for balance due to poor anterior weight shift (1x pt having to return to sitting EOB due to posterior LOB) and L posterior lean - pulled pants up over hips with min assist for balance and min assist for LB clothing management.  After this activity, pt with increased fatigue keeping eyes closed sitting EOB requiring a rest break and increased cuing to awaken and continue with session.  L stand pivot EOB>w/c using RW with min assist for balance and cuing for proper AD management. Sitting in w/c at sink, performed oral care with light min assist to set-up task.  Donned pt's L LE knee sleeve for pain  management during standing/ambulation.   Transported to/from gym in w/c for time management and energy conservation.  Gait training 1230f no UE support, with consistent heavy min assist for balance due to L lateral lean - pt with decreased L stance time, quick forward R step through with R LE step length longer than L - no anterior lean noticed today.  Gait training ~9013fsing RW with CGA/light min assist for steadying - pt with some inconsistent steps causing increased balance instability but significantly improved compared to no AD - pt still needs a youth RW but none available at this time.  Therapist swapped out pt's wheelchair for one that has lower floor-to-seat height to allow pt's feet to touch the floor.   Transported pt back to the room and pt agreeable to remain sitting up in w/c after min encouragement. Pt left with needs in reach and her husband present.  Therapy Documentation Precautions:  Precautions Precautions: Fall Precaution Comments: Aspiration Restrictions Weight Bearing Restrictions: No   Pain: Continues to have chronic L knee pain, utilized pt's personal knee sleeve for pain management during session.   Therapy/Group: Individual Therapy  CarTawana ScalePT, DPT, NCS, CSRS 12/02/2022, 7:47 AM

## 2022-12-02 NOTE — Progress Notes (Signed)
Occupational Therapy Session Note  Patient Details  Name: [name] MRN: [id number] Date of Birth: 01/08/1941  Today's Date: 12/02/2022 OT Individual Time: 0945-1100 OT Individual Time Calculation (min): 75 min    Short Term Goals: Week 1:  OT Short Term Goal 1 (Week 1): Pt will perform 3/3 toileting activities with CGA + LRAD. OT Short Term Goal 2 (Week 1): Pt will perform bathing activities with supervision + Min safety cues. OT Short Term Goal 3 (Week 1): Pt will perform full body dressing with supervision + Min safety cues.  Skilled Therapeutic Interventions/Progress Updates:     Pt received sitting up in wc with husband present in room. Pt reporting she is feeling sleepy, but motivated to participate in skilled therapy session. Pt receptive of taking shower this AM. Focus this session dynamic sitting balance, functional use of LUE, and safety awareness within BADL re-training.  Pt ambulated to bathroom using RW CGA with mod verbal cues for hand placement, safety, and trunk alignment. Pt noted to have single LOB during ambulation to L side requiring mod A to correct. Pt transferred to toilet CGA using RW with verbal cues required for safety and technique. Pt provided increased time on toilet d/t pt reporting urge to have BM without success this session (continent void). Ambulated to shower bench using RW CGA with verbal cues required for pacing, safety, and technique. Pt able to complete UB bathing at overall CGA to maintain dynamic sitting balance and LB bathing min A to wash bottom in standing while holding onto grab bars. Pt required min verbal cues to initiate using L hand during shower. Following shower, Pt ambulated to EOB for dressing tasks. Seated EOB, Pt recalled hemi-dressing technique donning L arm and leg first into clothing. Pt able to donn shirt seated EOB CGA with mod verbal cues for body positioning and to miantain dynamic sitting balance. Pt donned underwear and pants in  sitting and standing to pull them to her waist CGA. Pt fatigued following BADLs and requesting rest break sitting up in bed. Pt reported she had been feeling mildly depressed, "scared", and like a bourdon to her husband following her recent CVA. Pt provided therapeutic support, listening, and education on impact of CVA and CVA recovery. Pt receptive to education and to allowing her self more grace and time during the recovery process. Pt also provided support from her husband present in room. Pt educated on CVA support group and interested in [place] group following d/c with husband also in support of idea. Following rest break, Pt dried her hair sitting in her wc at the sink with min verbal cues to utilize LUE. Pt brushed her teeth in standing CGA and was able to manipulate toothpaste and toothbrush using B UES.  Pt provided blue foam block and cut up blue foam block to increase use of L hand between therapy session. Pt receptive of HEP exercises using foam blocks demonstrating teach back as evidence of learning. Pt was left resting in her wc with her husband present in the room, call bell in reach, seat belt alarm on, and all needs met.  Therapy Documentation Precautions:  Precautions Precautions: Fall Precaution Comments: Aspiration Restrictions Weight Bearing Restrictions: No General:   Vital Signs: Therapy Vitals Temp: (Abnormal) 97.5 F (36.4 C) Temp Source: Oral Pulse Rate: 67 Resp: 17 BP: (Abnormal) 153/67 Patient Position (if appropriate): Lying Oxygen Therapy SpO2: 98 % O2 Device: Room Air Pain: Pain Assessment Pain Scale: 0-10 Pain Score: 0-No pain ADL: ADL  Eating: Set up Where Assessed-Eating: Wheelchair Upper Body Bathing: Setup Where Assessed-Upper Body Bathing: Shower Lower Body Bathing: Moderate cueing, Contact guard (For safety, to avoid hitting her head on shower head or grab bar.) Where Assessed-Lower Body Bathing: Shower Upper Body Dressing: Minimal assistance  (Due to IV located on R-bicep area and impulsivity.) Where Assessed-Upper Body Dressing: Other (Comment) (Seated on TTB) Lower Body Dressing: Minimal assistance, Moderate cueing (For safety with standing balance.) Where Assessed-Lower Body Dressing: Other (Comment) (Seated on TTB) Toileting: Minimal assistance (For safety with dynamic standing balance.) Where Assessed-Toileting: Glass blower/designer: Therapist, music Method: Counselling psychologist: Geophysical data processor: Curator Method: Heritage manager: Radio broadcast assistant, Estate agent   Exercises:   Other Treatments:     Therapy/Group: Individual Therapy  [name] 12/02/2022, 7:59 AM

## 2022-12-02 NOTE — Progress Notes (Signed)
PROGRESS NOTE   Subjective/Complaints:    Review of Systems  Constitutional:  Negative for chills and fever.  Respiratory:  Negative for shortness of breath.   Cardiovascular:  Negative for chest pain.  Gastrointestinal:  Positive for diarrhea. Negative for constipation.  Genitourinary:  Negative for dysuria, frequency and urgency.  Musculoskeletal:  Positive for back pain.  Neurological:  Positive for speech change and focal weakness.  Psychiatric/Behavioral:  Positive for depression and hallucinations.     Objective:   No results found. No results for input(s): "WBC", "HGB", "HCT", "PLT" in the last 72 hours.  No results for input(s): "NA", "K", "CL", "CO2", "GLUCOSE", "BUN", "CREATININE", "CALCIUM" in the last 72 hours.   Intake/Output Summary (Last 24 hours) at 12/02/2022 0803 Last data filed at 12/01/2022 1200 Gross per 24 hour  Intake 354 ml  Output --  Net 354 ml         Physical Exam: Vital Signs Blood pressure (!) 153/67, pulse 67, temperature (!) 97.5 F (36.4 C), temperature source Oral, resp. rate 17, height '4\' 11"'$  (1.499 m), weight 50.8 kg, SpO2 98 %.   General: No apparent distress HEENT: Head is normocephalic, atraumatic,  oral mucosa pink and moist Neck: Supple without JVD or lymphadenopathy Heart: RRR Chest: CTA bilaterally without wheezes, rales, or rhonchi; no distress Abdomen: Soft, non-tender, non-distended, bowel sounds positive. Extremities: No clubbing, cyanosis, left upper extremity edema pulses are 2+ Psych: Pt's affect is appropriate. Pt is cooperative pleasant, +hallucinations- resolved today Skin: warm and dry Neuro: Alert and oriented to place, name, month and year and situation.  Follows commands.  Left facial droop.  EOMI.  PERRL Sensation intact light touch in all 4 extremities Strength 5 out of 5 in right lower extremities and right upper extremity Strength 4+ out of 5  left lower extremity Strength 3 out of 5 proximal left upper extremity to 4- out of 5 distal right upper extremity Musculoskeletal: No joint swelling or tenderness noted, no abnormal range of motion noted   Assessment/Plan: 1. Functional deficits which require 3+ hours per day of interdisciplinary therapy in a comprehensive inpatient rehab setting. Physiatrist is providing close team supervision and 24 hour management of active medical problems listed below. Physiatrist and rehab team continue to assess barriers to discharge/monitor patient progress toward functional and medical goals  Care Tool:  Bathing    Body parts bathed by patient: Right arm, Left arm, Chest, Abdomen, Face, Right upper leg, Left upper leg, Front perineal area, Buttocks, Right lower leg, Left lower leg         Bathing assist Assist Level: Minimal Assistance - Patient > 75% (For standing balance.)     Upper Body Dressing/Undressing Upper body dressing   What is the patient wearing?: Pull over shirt    Upper body assist Assist Level: Set up assist    Lower Body Dressing/Undressing Lower body dressing      What is the patient wearing?: Underwear/pull up, Pants     Lower body assist Assist for lower body dressing: Minimal Assistance - Patient > 75% (For standing balance.)     Toileting Toileting    Toileting assist Assist for toileting: Minimal Assistance -  Patient > 75%     Transfers Chair/bed transfer  Transfers assist     Chair/bed transfer assist level: Moderate Assistance - Patient 50 - 74%     Locomotion Ambulation   Ambulation assist      Assist level: Maximal Assistance - Patient 25 - 49% Assistive device: Hand held assist Max distance: 60f   Walk 10 feet activity   Assist     Assist level: Maximal Assistance - Patient 25 - 49% Assistive device: Hand held assist   Walk 50 feet activity   Assist    Assist level: Maximal Assistance - Patient 25 - 49% Assistive  device: Hand held assist    Walk 150 feet activity   Assist Walk 150 feet activity did not occur: Safety/medical concerns         Walk 10 feet on uneven surface  activity   Assist Walk 10 feet on uneven surfaces activity did not occur: Safety/medical concerns         Wheelchair     Assist Is the patient using a wheelchair?: Yes (for transport) Type of Wheelchair: Manual    Wheelchair assist level: Dependent - Patient 0%      Wheelchair 50 feet with 2 turns activity    Assist        Assist Level: Dependent - Patient 0%   Wheelchair 150 feet activity     Assist      Assist Level: Dependent - Patient 0%   Blood pressure (!) 153/67, pulse 67, temperature (!) 97.5 F (36.4 C), temperature source Oral, resp. rate 17, height '4\' 11"'$  (1.499 m), weight 50.8 kg, SpO2 98 %.  Medical Problem List and Plan: 1. 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  9 mm right occipital pole infarct.  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             -patient may  shower             -ELOS/Goals: 10-14 days          Continue CIR, PT/OT/SLP  -DC IV 2.  Antithrombotics: -DVT/anticoagulation:  Pharmaceutical: Lovenox             -antiplatelet therapy: Aspirin 81 mg daily and Plavix 75 mg daily x 21 days then Plavix alone 3. Pain Management: Neurontin 300 mg 3 times daily at home. Will decrease to 300 BID based on pharmacy recommendation.  Tylenol as needed 4. Mood/Behavior/Sleep: Cymbalta 30 mg daily provide emotional support             -antipsychotic agents: N/A 5. Neuropsych/cognition: This patient is capable of making decisions on her own behalf. 6. Skin/Wound Care: Routine skin checks 7. Fluids/Electrolytes/Nutrition: Routine in and outs with  follow-up chemistries 8.  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  Latest creatinine improved 1.28.  Follow-up chemistries 9.  Aspiration pneumonia finishing course of Augmentin 10.  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  Follow-up outpatient as needed.  Follow-up CT scan with IV contrast if possible in a few weeks.  If still present consider MRI. 11.  Hypothyroidism.  Synthroid 12.  Chronic diastolic congestive heart failure.  Monitor for any signs of fluid overload. Monitor weight -12/25 weight appears stable, no signs overload   Filed Weights   11/29/22 0626 11/30/22 0522 12/01/22 0558  Weight: 50.6  kg 49.8 kg 50.8 kg    13.  Iron deficiency anemia.  Follow-up CBC.  Continue Niferex daily 14.  History of asthma.  Continue inhalers as directed as well as chronic prednisone 10 mg daily 15.  Hyperlipidemia.  Lipitor 40 mg 16.  Hypertension.  Will hold off on restarting Norvasc 10 mg daily, Inderal 60 mg daily. Consider restarting if needed.  Losartan stopped due to AKI.  Well-controlled overall.  Monitor with increased   -12/26 Restart inderal '60mg'$      12/02/2022    5:33 AM 12/01/2022    8:35 PM 12/01/2022    5:31 PM  Vitals with BMI  Systolic [phone]  Diastolic 67 70 79  Pulse 67 85 87    17.  Abdominal pain and coffee-ground emesis.  Evaluated by GI no procedures recommended.  Continue pantoprazole and follow-up with GI outpatient.  May need outpatient EGD. 18.  Hypomagnesemia.  Recheck level  -12/22 1.7 today, add 1 tab slow mg daily  12/25 MG 1.8 today stable 19.  Hypokalemia.  Recheck level 20. Constipation: resolved, stool now loose, discussed initial constipation could have been from iron 21. Fatigue  -Check orthostatic VS, Ted hose 22. Hypokalemia  -12/22 Replete with KCL, improved to 3.5 -12/25 recheck tomorrow 23. Loose stool: d/c miralax, resolved. D/c magnesium.  24. Hallucinations:resolved  UA/UC ordered.   -U/A with WBC, neg nitrite, will follow culture  results, She denies dysuria and hallucinations have improved   Occipital infarcts may be causing visual misperceptions   LOS: 5 days A FACE TO FACE EVALUATION WAS PERFORMED  Charlett Blake 12/02/2022, 8:03 AM

## 2022-12-03 DIAGNOSIS — I639 Cerebral infarction, unspecified: Secondary | ICD-10-CM | POA: Diagnosis not present

## 2022-12-03 LAB — URINE CULTURE: Culture: >=100000 — AB

## 2022-12-03 MED ORDER — CEPHALEXIN 250 MG PO CAPS
250.0000 mg | ORAL_CAPSULE | Freq: Three times a day (TID) | ORAL | Status: AC
  Administered 2022-12-03 – 2022-12-10 (×22): 250 mg via ORAL
  Filled 2022-12-03 (×23): qty 1

## 2022-12-03 NOTE — Progress Notes (Signed)
Occupational Therapy Session Note  Patient Details  Name: [name] MRN: [id number] Date of Birth: [birth year]  Today's Date: 12/03/2022 OT Individual Time: 2229-7989 OT Individual Time Calculation (min): 75 min    Short Term Goals: Week 1:  OT Short Term Goal 1 (Week 1): Pt will perform 3/3 toileting activities with CGA + LRAD. OT Short Term Goal 2 (Week 1): Pt will perform bathing activities with supervision + Min safety cues. OT Short Term Goal 3 (Week 1): Pt will perform full body dressing with supervision + Min safety cues.  Skilled Therapeutic Interventions/Progress Updates:     Pt received resting in bed with husband, [name], present in room. Pt in good spirits and receptive to skilled therapy session. RN in/out at beginning of session for medication management. MD also in/out for morning rounds. Discussed need for resting hand splint with MD to prevent increase in spasticity at night.  Pt and husband provided information on CVA support group, reporting continued interest in group to support Pt mental health following d/c. Pt also reported she had not been participating in meaningful activities at home d/t fear of falling. OT, Pt, and husband discussed modifications for adapting environment to increase safety and allow Pt to continue to participate in meaningful activities such as gardening. OT, Pt, and husband discussed need for follow up therapy following d/c with both being receptive to [place].  Pt transitioned from supine to EOB CGA when lifting trunk to prevent posterior lean. Pt able to maintain dynamic sitting balance EOB with stand by assist during dressing and grooming tasks. Pt educated on modified technique for doffing shirt completing task with min A. Pt donned shirt supervision with min verbal cues for problem solving and implementing hemi-dressing technique. L knee sleeve donned on Pt total A to prevent pain during therapy session. Pt donned underwear and pants with CGA to  maintain balance when bringing pants to waist. Pt ambulated within room using RW with min verbal cues for safety for RW management and hand placement. Pt able to brush teeth in standing using RW CGA. Pt able to manipulate tooth paste and toothbrush using L hand when giving maximal effort.  Pt transported to therapy gym total A for time management. Pt completed seated  L hand NMR activities to increase L hand strength and coordination. Pt able to utilize L hand to pick up, transport, and place medium sized wooden pegs into peg board when giving maximal effort. Pt required moderate verbal cues during task to fully extend pointer finger during grasp and release to facilitate moving through full ROM. Pt educated on simple activities to complete at home to improve L hand FM dexterity and functional use. Pt transported back to room total A in wc and left resting in wc with call bell in reach, seat belt alarm on, husband present in room, and all needs met.   Therapy Documentation Precautions:  Precautions Precautions: Fall Precaution Comments: Aspiration Restrictions Weight Bearing Restrictions: No General:   Vital Signs: Therapy Vitals Temp: 98.1 F (36.7 C) Pulse Rate: 68 Resp: 16 BP: (Abnormal) 156/66 Patient Position (if appropriate): Lying Oxygen Therapy SpO2: 98 % O2 Device: Room Air Pain:   ADL: ADL Eating: Set up Where Assessed-Eating: Wheelchair Upper Body Bathing: Setup Where Assessed-Upper Body Bathing: Shower Lower Body Bathing: Moderate cueing, Contact guard (For safety, to avoid hitting her head on shower head or grab bar.) Where Assessed-Lower Body Bathing: Shower Upper Body Dressing: Minimal assistance (Due to IV located on R-bicep area and  impulsivity.) Where Assessed-Upper Body Dressing: Other (Comment) (Seated on TTB) Lower Body Dressing: Minimal assistance, Moderate cueing (For safety with standing balance.) Where Assessed-Lower Body Dressing: Other (Comment) (Seated  on TTB) Toileting: Minimal assistance (For safety with dynamic standing balance.) Where Assessed-Toileting: Glass blower/designer: Therapist, music Method: Counselling psychologist: Geophysical data processor: Curator Method: Heritage manager: Radio broadcast assistant, Estate agent   Exercises:   Other Treatments:     Therapy/Group: Individual Therapy  [name] 12/03/2022, 8:20 AM

## 2022-12-03 NOTE — Progress Notes (Signed)
Physical Therapy Session Note  Patient Details  Name: [name] MRN: [id number] Date of Birth: 1941-01-05  Today's Date: 12/03/2022 PT Individual Time: 7615-1834 PT Individual Time Calculation (min): 30 min   Short Term Goals: Week 1:  PT Short Term Goal 1 (Week 1): Pt will perform sit<>stands using LRAD with min assist PT Short Term Goal 2 (Week 1): Pt will perform bed<>chair transfers using LRAD with min assist PT Short Term Goal 3 (Week 1): Pt will ambulate at least 31f using LRAD with min assist PT Short Term Goal 4 (Week 1): Pt will perform 4 stair navigation using HRs with no more than mod assist PT Short Term Goal 5 (Week 1): Pt will improve score on Berg Balance Test by at least 7 points  Skilled Therapeutic Interventions/Progress Updates:     Patient in w/c with her husband in the room upon PT arrival. Patient alert and agreeable to PT session. Patient reported 7-8/10 L knee pain with weight bearing during session, RN made aware. PT provided repositioning, rest breaks, and distraction as pain interventions throughout session.   Discussed use of off-loading knee brace for L knee pain management with weight bearing. Patient open to trialing brace despite education on bulkiness and don/doffing brace during the day. Lead PT made aware.   Patient with sudden onset of fatigue with ambulation and spontaneous sitting on rolling stool during session. Vitals assessed: sitting BP 149/72, standing 110/56 (symptomatic fatigue in standing returned to sitting in standard chair). Donned B TED hose following BP assessment due to orthostasis, RN and lead PT made aware.   Therapeutic Activity: Transfers: Patient performed sit to/from stand x1 with CGA and mod A to sit due to impulsivity with fatigue onto rolling stool, stool stabilized against the wall and by PT for patient safety. She performed stand pivot with min A stool>standard chair and standard chair to w/c. Provided verbal cues for safety  awareness and forward weight shift.  Gait Training:  Patient ambulated >150 feet without an AD with min A-CGA, increased assist with sudden onset of fatigue once in main therapy gym. Ambulated with decreased gait speed, decreased step length and height, decreased L weight shift with antalgic gait, variable foot placement, forward trunk lean, and downward head gaze (increased with fatigue). Provided verbal cues for erect posture, increased gait speed and arm swing for improved balance without AD, and consistent stepping hip width apart to manage scissoring and narrow BOS.  Patient in w/c with her husband planning to attend sing-along event in the Day Room at end of session with breaks locked, seat belt alarm set, and all needs within reach.   Therapy Documentation Precautions:  Precautions Precautions: Fall Precaution Comments: Aspiration Restrictions Weight Bearing Restrictions: No   Therapy/Group: Individual Therapy  Abygale Karpf L Wilder Amodei PT, DPT, NCS, CBIS  12/03/2022, 12:21 PM

## 2022-12-03 NOTE — Progress Notes (Signed)
Speech Language Pathology Daily Session Note  Patient Details  Name: [name] MRN: [id number] Date of Birth: 22-Dec-1940  Today's Date: 12/03/2022 SLP Individual Time: 1400-1458 SLP Individual Time Calculation (min): 58 min  Short Term Goals: Week 1: SLP Short Term Goal 1 (Week 1): Pt will sustain attention to functional tasks for 10 minute intervals with min A verbal redirection cues SLP Short Term Goal 2 (Week 1): Pt will recall and verbally describe at least 3 compensatory memory strategies with min A verbal cues SLP Short Term Goal 3 (Week 1): Pt will demonstrate use of compensatory memory strategies during functional tasks with min A verbal cues SLP Short Term Goal 4 (Week 1): Pt will complete mildly complex cognitive tasks with min A verbal cues for effective problem solving  Skilled Therapeutic Interventions: Skilled treatment session focused on cognitive goals. Upon arrival, patient was awake while upright in the wheelchair. SLP facilitated session by providing Min verbal cues for problem solving and recall during a novel card task as patient requested "games" or activities she can participate in at home with her husband to stay "busy" and not feel so "bored." Patient also performed a 5 picture sequencing task with the BITS with 87% accuracy and Min verbal cues for attention to left field of environment/body. Patient left upright in wheelchair with alarm on and all needs within reach. Continue with current plan of care.      Pain Pain Assessment Pain Scale: 0-10 Pain Score: 0-No pain  Therapy/Group: Individual Therapy  Alecxis Baltzell 12/03/2022, 3:10 PM

## 2022-12-03 NOTE — Patient Care Conference (Signed)
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 Alysia Penna Social Worker Present: Erlene Quan, BSW Nurse Present: Dorien Chihuahua, RN PT Present: Page Spiro, PT OT Present: Other (comment) Lowella Fairy, OT) SLP Present: Charolett Bumpers, SLP PPS Coordinator present : Gunnar Fusi, SLP     Current Status/Progress Goal Weekly Team Focus  Bowel/Bladder   Continent of b/b   Remain continent   Assist with toileting as needed    Swallow/Nutrition/ Hydration               ADL's   CGA-min A toilet/shower transfer using RW, min U/LB dressing, CGA UB bathing, Min LB bathing, CGA-min A grooming; motivated for therapy, however very tired and fatigues quickly; benefits from positive affirmation, recieving education, and motivation   Supervision   BADL retrianing, endurance training, activity tolerance, balance training, functional transfers    Mobility   CGA/supervision supine<>sit, CGA/min assist sit<>stand and stand pivot transfers using RW, min assist gait up to 169f using RW, not yet performed stair navigation - severe endurance impairments   CGA overall at ambulatory level  pt/family education, transfer training, sitting and standing balance, DME training, dynamic gait training, activity tolerance     Communication                Safety/Cognition/ Behavioral Observations  Min-Mod A   Supervision   sustained attention, recall with use of strategies, functional problem solving    Pain   No c/o pain   Remain pain free   Assess Qshift and prn    Skin   skin intact   Maintain skin integrity  assess Qshift and prn      Discharge Planning:  Discharging home with spouse and church memebers to assist 24/7.   Team Discussion: Patient post CVA, aspiration pna treated. Potassium level stable per MD. Occasional orthostatic blood pressures/symptomatic with activity; TTEDs added.  Patient on target to meet rehab goals: yes, currently needs CGA - min assist for activities due to loss of balance. Needs min assist for lower body ADLs and CGA for upper body care. Needs CGA for bed mobility and min assist for stand pivot and sit - stand Able to ambulate with min assist using a RW with left hand guard.  Tolerate a regular thin liquid det. Needs mod - max assist for attention, recall and problem solving. Goals for discharge set for supervison - CGA overall.  *See Care Plan and progress notes for long and short-term goals.   Revisions to Treatment Plan:  N/a  Teaching Needs: Safety, medications, transfers, toileting, etc.   Current Barriers to Discharge: Decreased caregiver support and Home enviroment access/layout  Possible Resolutions to Barriers: Family education OP follow up services     Medical Summary Current Status: poor balance due to cerebvellar CVA , occ problems swallowing pills  Barriers to  Discharge: Uncontrolled Hypertension   Possible Resolutions to [employer] Focus: may need additional BP management prior to d/c,orthostatic drops   Continued Need for Acute Rehabilitation Level of Care: The patient requires daily medical management by a physician with specialized training in physical medicine and rehabilitation for the following reasons: Direction of a  multidisciplinary physical rehabilitation program to maximize functional independence : Yes Medical management of patient stability for increased activity during participation in an intensive rehabilitation regime.: Yes Analysis of laboratory values and/or radiology reports with any subsequent need for medication adjustment and/or medical intervention. : Yes   I attest that I was present, lead the team conference, and concur with the assessment and plan of the team.   Dorien Chihuahua B 12/03/2022, 3:43 PM

## 2022-12-03 NOTE — Progress Notes (Signed)
Physical Therapy Session Note  Patient Details  Name: [name] MRN: [id number] Date of Birth: November 02, 1941  Today's Date: 12/03/2022 PT Individual Time: 1107-1201 PT Individual Time Calculation (min): 54 min   Short Term Goals: Week 1:  PT Short Term Goal 1 (Week 1): Pt will perform sit<>stands using LRAD with min assist PT Short Term Goal 2 (Week 1): Pt will perform bed<>chair transfers using LRAD with min assist PT Short Term Goal 3 (Week 1): Pt will ambulate at least 45f using LRAD with min assist PT Short Term Goal 4 (Week 1): Pt will perform 4 stair navigation using HRs with no more than mod assist PT Short Term Goal 5 (Week 1): Pt will improve score on Berg Balance Test by at least 7 points  Skilled Therapeutic Interventions/Progress Updates:    Pt received sitting in w/c with her husband, [name] present and pt agreeable to therapy session. Per earlier PT, pt experiencing orthostatic hypotension. Pt currently wearing knee high TED hose.   Assessed orthostatic vitals throughout session as documented below.  Sitting in w/c at start of session: BP 135/59 (MAP 82), HR 72bpm  Standing: BP 102/49 (MAP 65), HR 78bpm  Pt reporting feeling "tired" with eyelids getting heavy and starting to close.  Returned to sitting and donned thigh high TED hose. Asked nursing secretary to order pt small, short thigh high TEDs for improved fit - notified nurse.  Sitting vitals: BP 134/57 (MAP 79), HR 76bpm  Standing: BP 111/50 (MAP 68), HR 84bpm   Nurse, MD, and PA notified of orthostatic hypotension - MD ordering abdominal binder.  Returned to sitting and reassessed: BP 134/54 (MAP 77), HR 68bpm   Gait training ~457fusing RW with CGA for steadying/safety with pt having slight L balance instability.  Reassessed vitals in standing after gait: BP 93/66 (MAP 76), HR 86bpm  Reassessed in sitting after 2 minutes: BP 118/85 (MAP 96), HR 74bpm   After 5 minute seated rest break: BP 128/57 (MAP 79),  HR 71bpm   Performed additional 4010ff gait training using RW with CGA then reassessed vitals in standing immediately afterwards: BP 99/46 (MAP 63), HR 86bpm   Pt continues to have symptoms of feeling tired when her BP was low. Transported back to her room and pt agreeable to remain sitting up in w/c for lunch. Pt left seated with needs in reach, seat belt alarm on, and pt's husband present.  Therapy Documentation Precautions:  Precautions Precautions: Fall Precaution Comments: Aspiration Restrictions Weight Bearing Restrictions: No   Pain:  Reports no knee pain currently but states when the onset of pain occurs it is severe - [facility] brace options such as [employer]anger OA knee brace.   Therapy/Group: Individual Therapy  CarTawana ScalePT, DPT, NCS, CSRS 12/03/2022, 7:57 AM

## 2022-12-03 NOTE — Progress Notes (Signed)
Patient ID: [name], female   DOB: 09-28-1941, 81 y.o.   MRN: [id number]  Team Conference Report to Patient/Family  Team Conference discussion was reviewed with the patient and caregiver, including goals, any changes in plan of care and target discharge date.  Patient and caregiver express understanding and are in agreement.  The patient has a target discharge date of 12/10/22.  SW met with patient and spouse, [name] in room and provided conference updates. Patient has not trailed steps. Spouse fine with transporting pt to OP therapy. No additional questions or concerns.  Dyanne Iha 12/03/2022, 1:52 PM

## 2022-12-03 NOTE — Progress Notes (Addendum)
PROGRESS NOTE   Subjective/Complaints:  Left hand is still week but using this for ADLs  Having symptomatic ortho BP drops in standing even with TED hose  Review of Systems  Constitutional:  Negative for chills and fever.  Respiratory:  Negative for shortness of breath.   Cardiovascular:  Negative for chest pain.  Gastrointestinal:  Positive for diarrhea. Negative for constipation.  Genitourinary:  Negative for dysuria, frequency and urgency.  Musculoskeletal:  Positive for back pain.  Neurological:  Positive for speech change and focal weakness.  Psychiatric/Behavioral:  Positive for depression and hallucinations.     Objective:   No results found. No results for input(s): "WBC", "HGB", "HCT", "PLT" in the last 72 hours.  Recent Labs    12/02/22 0948  NA 135  K 3.7  CL 103  CO2 27  GLUCOSE 108*  BUN 17  CREATININE 1.28*  CALCIUM 9.3     Intake/Output Summary (Last 24 hours) at 12/03/2022 0820 Last data filed at 12/02/2022 1710 Gross per 24 hour  Intake 477 ml  Output --  Net 477 ml         Physical Exam: Vital Signs Blood pressure (!) 156/66, pulse 68, temperature 98.1 F (36.7 C), resp. rate 16, height '4\' 11"'$  (1.499 m), weight 50 kg, SpO2 98 %.   General: No acute distress Mood and affect are appropriate Heart: Regular rate and rhythm no rubs murmurs or extra sounds Lungs: Clear to auscultation, breathing unlabored, no rales or wheezes Abdomen: Positive bowel sounds, soft nontender to palpation, nondistended Extremities: No clubbing, cyanosis, or edema    Skin: warm and dry Neuro: Alert and oriented to place, name, month and year and situation.  Follows commands.  Left facial droop.  EOMI.  PERRL Sensation intact light touch in all 4 extremities Strength 5 out of 5 in right lower extremities and right upper extremity Strength 4+ out of 5 left lower extremity Strength 3 out of 5 proximal  left upper extremity to 4- out of 5 distal right upper extremity Musculoskeletal: No joint swelling or tenderness noted, no abnormal range of motion noted   Assessment/Plan: 1. Functional deficits which require 3+ hours per day of interdisciplinary therapy in a comprehensive inpatient rehab setting. Physiatrist is providing close team supervision and 24 hour management of active medical problems listed below. Physiatrist and rehab team continue to assess barriers to discharge/monitor patient progress toward functional and medical goals  Care Tool:  Bathing    Body parts bathed by patient: Right arm, Left arm, Chest, Abdomen, Face, Right upper leg, Left upper leg, Front perineal area, Buttocks, Right lower leg, Left lower leg         Bathing assist Assist Level: Minimal Assistance - Patient > 75% (For standing balance.)     Upper Body Dressing/Undressing Upper body dressing   What is the patient wearing?: Pull over shirt    Upper body assist Assist Level: Set up assist    Lower Body Dressing/Undressing Lower body dressing      What is the patient wearing?: Underwear/pull up, Pants     Lower body assist Assist for lower body dressing: Minimal Assistance - Patient > 75% (For  standing balance.)     Toileting Toileting    Toileting assist Assist for toileting: Minimal Assistance - Patient > 75%     Transfers Chair/bed transfer  Transfers assist     Chair/bed transfer assist level: Moderate Assistance - Patient 50 - 74%     Locomotion Ambulation   Ambulation assist      Assist level: Maximal Assistance - Patient 25 - 49% Assistive device: Hand held assist Max distance: 1f   Walk 10 feet activity   Assist     Assist level: Maximal Assistance - Patient 25 - 49% Assistive device: Hand held assist   Walk 50 feet activity   Assist    Assist level: Maximal Assistance - Patient 25 - 49% Assistive device: Hand held assist    Walk 150 feet  activity   Assist Walk 150 feet activity did not occur: Safety/medical concerns         Walk 10 feet on uneven surface  activity   Assist Walk 10 feet on uneven surfaces activity did not occur: Safety/medical concerns         Wheelchair     Assist Is the patient using a wheelchair?: Yes (for transport) Type of Wheelchair: Manual    Wheelchair assist level: Dependent - Patient 0%      Wheelchair 50 feet with 2 turns activity    Assist        Assist Level: Dependent - Patient 0%   Wheelchair 150 feet activity     Assist      Assist Level: Dependent - Patient 0%   Blood pressure (!) 156/66, pulse 68, temperature 98.1 F (36.7 C), resp. rate 16, height '4\' 11"'$  (1.499 m), weight 50 kg, SpO2 98 %.  Medical Problem List and Plan: 1. 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  9 mm right occipital pole infarct.  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             -patient may  shower             -ELOS/Goals: 12/10/22          Continue CIR, PT/OT/SLP  -DC IV 2.  Antithrombotics: -DVT/anticoagulation:  Pharmaceutical: Lovenox             -antiplatelet therapy: Aspirin 81 mg daily and Plavix 75 mg daily x 21 days then Plavix alone 3. Pain Management: Neurontin 300 mg 3 times daily at home. Will decrease to 300 BID based on pharmacy recommendation.  Tylenol as needed Severe OA left knee, cont Voltaren , discussed rec for no elective surgery for ~668moost stroke  4. Mood/Behavior/Sleep: Cymbalta 30 mg daily provide emotional support             -antipsychotic agents: N/A 5. Neuropsych/cognition: This patient is capable of making decisions on her own behalf. 6. Skin/Wound Care: Routine skin checks 7. Fluids/Electrolytes/Nutrition:  Routine in and outs with follow-up chemistries 8.  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  Latest creatinine improved 1.28.  Follow-up chemistries 9.  Aspiration pneumonia finishing course of Augmentin 10.  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  Follow-up outpatient as needed.  Follow-up CT scan with IV contrast if possible in a few weeks.  If still present consider MRI. 11.  Hypothyroidism.  Synthroid 12.  Chronic diastolic congestive heart failure.  Monitor for any signs of fluid overload. Monitor weight -12/25 weight appears stable, no signs overload   Filed Weights   11/30/22 0522 12/01/22 0558 12/03/22 0500  Weight: 49.8 kg 50.8 kg 50 kg    13.  Iron deficiency anemia.  Follow-up CBC.  Continue Niferex daily 14.  History of asthma.  Continue inhalers as directed as well as chronic prednisone 10 mg daily 15.  Hyperlipidemia.  Lipitor 40 mg 16.  Hypertension.  Will hold off on restarting Norvasc 10 mg daily, Inderal 60 mg daily. Consider restarting if needed.  Losartan stopped due to AKI.  Well-controlled overall.  Monitor with increased   -12/26 Restart inderal '60mg'$  12/27 will d/c inderal due to worsening orthostasis    12/03/2022    5:32 AM 12/03/2022    5:00 AM 12/02/2022    8:07 PM  Vitals with BMI  Weight  110 lbs 4 oz   BMI  55.73   Systolic 220  254  Diastolic 66  63  Pulse 68  75    17.  Abdominal pain and coffee-ground emesis.  Evaluated by GI no procedures recommended.  Continue pantoprazole and follow-up with GI outpatient.  May need outpatient EGD. 18.  Hypomagnesemia.  Recheck level  -12/22 1.7 today, add 1 tab slow mg daily  12/25 MG 1.8 today stable 19.  Hypokalemia.  Recheck level 20. Constipation: resolved, stool now loose, discussed initial constipation could have been from iron 21. Fatigue  -Check orthostatic VS, Ted hose 22. Hypokalemia  -12/22 Replete with KCL, improved to 3.5 -12/25 recheck tomorrow 23. Loose stool: d/c miralax, resolved. D/c  magnesium.  24. Hallucinations:resolved  UA/UC ordered.   -U/A with WBC, many bact , neg nitrite, UCx >100K Kleb P , start keflex    Occipital infarcts may be causing visual misperceptions   LOS: 6 days A FACE TO FACE EVALUATION WAS PERFORMED  Charlett Blake 12/03/2022, 8:20 AM

## 2022-12-04 ENCOUNTER — Encounter: Payer: Medicare HMO | Admitting: Speech Pathology

## 2022-12-04 ENCOUNTER — Ambulatory Visit: Payer: Medicare HMO | Admitting: Occupational Therapy

## 2022-12-04 ENCOUNTER — Ambulatory Visit: Payer: Medicare HMO

## 2022-12-04 DIAGNOSIS — I639 Cerebral infarction, unspecified: Secondary | ICD-10-CM | POA: Diagnosis not present

## 2022-12-04 LAB — CREATININE, SERUM
Creatinine, Ser: 0.97 mg/dL (ref 0.44–1.00)
GFR, Estimated: 59 mL/min — ABNORMAL LOW (ref >60)

## 2022-12-04 NOTE — Progress Notes (Signed)
Orthopedic Tech Progress Note Patient Details:  [name] 22-Dec-1940 [id number] Called in order to [facility] for left resting WHO Patient ID: [name], female   DOB: 11-07-1941, 81 y.o.   MRN: [id number]  [name] 12/04/2022, 8:43 AM

## 2022-12-04 NOTE — Progress Notes (Signed)
Occupational Therapy Session Note  Patient Details  Name: [name] MRN: [id number] Date of Birth: [birth year]  Today's Date: 12/04/2022 OT Individual Time: 6213-0865 OT Individual Time Calculation (min): 54 min   Short Term Goals: Week 1:  OT Short Term Goal 1 (Week 1): Pt will perform 3/3 toileting activities with CGA + LRAD. OT Short Term Goal 2 (Week 1): Pt will perform bathing activities with supervision + Min safety cues. OT Short Term Goal 3 (Week 1): Pt will perform full body dressing with supervision + Min safety cues.  Skilled Therapeutic Interventions/Progress Updates:     Pt received resting in bed with husband present in room. Pt in good spirits and receptive to skilled therapy session. Pt reporting she had a good night and is looking forward to therapy today.  Pt requesting to use restroom at beginning of session. Pt move from reclined to EOB supervision. Vitals taken EOB: BP 121/63 HR 72. Pt completed sit>stand using RW CGA with mod verbal cues for safety. Vitals taken in standing: BP 92/46 (60) HR 82. Pt reported very mild lightheadedness in standing. Pt instructed to sit with symptoms quickly resolving. MD in/out for morning rounds. Abdominal binder donned on Pt. Pt completed stand pivot transfer to wc using RW with no dizziness reported. Pt transported to bathroom completing stand pivot transfer to standard toilet using RW. Pt completed 3/3 toileting tasks with min A for postrior peri-care. Pt ambulated from toilet to tub bench for bathing tasks. Pt completed U/LB bathing while seated with min verbal cues to initiate use of LUE. Pt demonstrating improved sitting balance for bathing- supervision to CGA. Pt donned shirt while sitting on tub bench min verbal cues for hemi-dressing technique. Abdominal binder donned on Pt max A. Pt completed stand pivot transfer to wc CGA mod cues for safety.  Vitals taken siting in wc: BP 131/58 (80) HR 70. Pt donned underwear and pants min A to  bring pants to waist. Pt compelted sit>stand using RW CGA. Vitals taken in standing with ABD binder and TED hose donned: BP 115/48 (65) HR 90. Pt educated on orthostatic hypotension symptoms and importance of communicating presence of symptoms to medical team. Pt was left resting in wc with call bell in reach, seat belt alarm on, and all needs met.   Therapy Documentation Precautions:  Precautions Precautions: Fall Precaution Comments: Aspiration Restrictions Weight Bearing Restrictions: No   ADL: ADL Eating: Set up Where Assessed-Eating: Wheelchair Upper Body Bathing: Setup Where Assessed-Upper Body Bathing: Shower Lower Body Bathing: Moderate cueing, Contact guard (For safety, to avoid hitting her head on shower head or grab bar.) Where Assessed-Lower Body Bathing: Shower Upper Body Dressing: Minimal assistance (Due to IV located on R-bicep area and impulsivity.) Where Assessed-Upper Body Dressing: Other (Comment) (Seated on TTB) Lower Body Dressing: Minimal assistance, Moderate cueing (For safety with standing balance.) Where Assessed-Lower Body Dressing: Other (Comment) (Seated on TTB) Toileting: Minimal assistance (For safety with dynamic standing balance.) Where Assessed-Toileting: Glass blower/designer: Therapist, music Method: Counselling psychologist: Geophysical data processor: Curator Method: Heritage manager: Radio broadcast assistant, Grab bars   Therapy/Group: Individual Therapy  [name] 12/04/2022, 8:19 AM

## 2022-12-04 NOTE — Progress Notes (Signed)
Speech Language Pathology Weekly Progress and Session Note  Patient Details  Name: [name] MRN: [id number] Date of Birth: [birth year]  Beginning of progress report period: November 27, 2022 End of progress report period: December 04, 2022  Today's Date: 12/04/2022 SLP Individual Time: 6222-9798 SLP Individual Time Calculation (min): 45 min  Short Term Goals: Week 1: SLP Short Term Goal 1 (Week 1): Pt will sustain attention to functional tasks for 10 minute intervals with min A verbal redirection cues SLP Short Term Goal 1 - Progress (Week 1): Met SLP Short Term Goal 2 (Week 1): Pt will recall and verbally describe at least 3 compensatory memory strategies with min A verbal cues SLP Short Term Goal 2 - Progress (Week 1): Met SLP Short Term Goal 3 (Week 1): Pt will demonstrate use of compensatory memory strategies during functional tasks with min A verbal cues SLP Short Term Goal 3 - Progress (Week 1): Met SLP Short Term Goal 4 (Week 1): Pt will complete mildly complex cognitive tasks with min A verbal cues for effective problem solving SLP Short Term Goal 4 - Progress (Week 1): Met    New Short Term Goals: Week 2: SLP Short Term Goal 1 (Week 2): STGs=LTGs due to ELOS  Weekly Progress Updates: Patient has made functional gains and has met 4 of 4 STGs this reporting period. Overall, patient requires Min verbal cues to complete functional and mildly complex tasks safely in regards to sustained attention, functional problem solving and recall with use of compensatory strategies. Patient and family education ongoing. Patient would benefit from continued skilled SLP intervention to maximize her cognitive functioning and overall functional independence prior to discharge.     Intensity: Minumum of 1-2 x/day, 30 to 90 minutes Frequency: 3 to 5 out of 7 days Duration/Length of Stay: 12/10/22 Treatment/Interventions: Cognitive remediation/compensation;Internal/external aids;Environmental  controls;Functional tasks;Patient/family education;Therapeutic Activities   Daily Session  Skilled Therapeutic Interventions:  Skilled treatment session focused on cognitive goals. Upon arrival, patient was awake while upright in the wheelchair. Patient was consuming medications whole in puree followed by sips of thin liquids via cup. No overt s/s of aspiration noted but patient reported feeling full requiring extra time to complete task. Patient reports difficulty with larger pills, therefore, SLP discussed possibility of consuming small pills whole with thin and larger pills whole in puree in hopes of reducing time and effort needed to consume medications. SLP also facilitated session by providing Min verbal cues for functional problem solving during a complex medication management task in which patient had to identify errors within a TID pill box. Patient demonstrated selective attention to task in a mildly distracting environment for 20 minutes with supervision level verbal cues fo redirection. Patient left upright in wheelchair with alarm on and all needs within reach. Continue with current plan of care.      Pain No/Denies Pain   Therapy/Group: Individual Therapy  [name] 12/04/2022, 7:16 AM

## 2022-12-04 NOTE — Progress Notes (Signed)
Physical Therapy Session Note  Patient Details  Name: [name] MRN: [id number] Date of Birth: October 09, 1941  Today's Date: 12/04/2022 PT Individual Time: 1307-1402 PT Individual Time Calculation (min): 55 min   Short Term Goals: Week 1:  PT Short Term Goal 1 (Week 1): Pt will perform sit<>stands using LRAD with min assist PT Short Term Goal 2 (Week 1): Pt will perform bed<>chair transfers using LRAD with min assist PT Short Term Goal 3 (Week 1): Pt will ambulate at least 5f using LRAD with min assist PT Short Term Goal 4 (Week 1): Pt will perform 4 stair navigation using HRs with no more than mod assist PT Short Term Goal 5 (Week 1): Pt will improve score on Berg Balance Test by at least 7 points  Skilled Therapeutic Interventions/Progress Updates:    Pt received sitting in w/c with her husband, [name] present and pt agreeable to therapy session.  Donned thigh high TED hose (small size in pt's room to wear), L knee sleeve, and abdominal binder total assist for time management.  Transported to/from gym in w/c for time management and energy conservation.  Sit<>stands using RW with CGA progressing towards close supervision.  Vitals:  Sitting in w/c to start session: BP 136/59 (MAP 80), HR 66bpm, SpO2 100%  Standing: BP 125/55 (MAP 73), HR 76bpm  After ~1783fgait training while in standing: BP 129/65 (MAP 84), HR 90bpm   Gait training ~17060fsing RW with CGA for safety and pt with a few instances of minor instability but able to recover without increased assistance - L LE adducts causing narrow BOS - pt reports increasing L knee pain during gait.  Stair navigation training ascending/descending 4 steps x2 (6" height) using B HRs with CGA/light min assist for steadying as pt with poor safety awareness self-selecting reciprocal stepping pattern with quick, unsafe movements.   [facility] and discussed off-loading knee brace such as [employer] to help  improve knee alignment as pt has varus alignment with knee bowing that is flexible. Pt requests to think on it prior to committing despite education on benefits of this support to improve alignment until pt is able to further pursue possibility of TKA. Pt's husband, DavShanon Browlanning to bring in the braces pt received from MD office to assess their appropriateness and fit as well because pt reports they would always fall off.  At end of session, pt left seated in w/c with needs in reach, seat belt alarm on, and her husband present.  Therapy Documentation Precautions:  Precautions Precautions: Fall Precaution Comments: Aspiration Restrictions Weight Bearing Restrictions: No   Pain:  Reports L knee pain - details above - seated rest break for pain management.    Therapy/Group: Individual Therapy  CarTawana ScalePT, DPT, NCS, CSRS 12/04/2022, 7:55 AM

## 2022-12-04 NOTE — Progress Notes (Signed)
PROGRESS NOTE   Subjective/Complaints:  Orthostatic "symptoms" mainly fatigue ,feels "normal " this am, discussed Left index extension problem with pt and husband, onset was after 1st CVA in Sept   Review of Systems  Constitutional:  Negative for chills and fever.  Respiratory:  Negative for shortness of breath.   Cardiovascular:  Negative for chest pain.  Gastrointestinal:  Positive for diarrhea. Negative for constipation.  Genitourinary:  Negative for dysuria, frequency and urgency.  Musculoskeletal:  Positive for back pain.  Neurological:  Positive for speech change and focal weakness.  Psychiatric/Behavioral:  Positive for depression and hallucinations.     Objective:   No results found. No results for input(s): "WBC", "HGB", "HCT", "PLT" in the last 72 hours.  Recent Labs    12/02/22 0948 12/04/22 0557  NA 135  --   K 3.7  --   CL 103  --   CO2 27  --   GLUCOSE 108*  --   BUN 17  --   CREATININE 1.28* 0.97  CALCIUM 9.3  --      Intake/Output Summary (Last 24 hours) at 12/04/2022 0831 Last data filed at 12/04/2022 0356 Gross per 24 hour  Intake 240 ml  Output --  Net 240 ml         Physical Exam: Vital Signs Blood pressure (!) 149/60, pulse 69, temperature 97.8 F (36.6 C), resp. rate 17, height '4\' 11"'$  (1.499 m), weight 49.9 kg, SpO2 99 %.   General: No acute distress Mood and affect are appropriate Heart: Regular rate and rhythm no rubs murmurs or extra sounds Lungs: Clear to auscultation, breathing unlabored, no rales or wheezes Abdomen: Positive bowel sounds, soft nontender to palpation, nondistended Extremities: No clubbing, cyanosis, or edema    Skin: warm and dry Neuro: Alert and oriented to place, name, month and year and situation.  Follows commands.  Left facial droop.  EOMI.  PERRL Sensation intact light touch in all 4 extremities Strength 5 out of 5 in right lower extremities and  right upper extremity Strength 4+ out of 5 left lower extremity Strength 3 out of 5 proximal left upper extremity to 4- out of 5 distal right upper extremity Musculoskeletal: No joint swelling or tenderness noted, no abnormal range of motion noted No evidence of Dupuytren's contracture in Left hand   Assessment/Plan: 1. Functional deficits which require 3+ hours per day of interdisciplinary therapy in a comprehensive inpatient rehab setting. Physiatrist is providing close team supervision and 24 hour management of active medical problems listed below. Physiatrist and rehab team continue to assess barriers to discharge/monitor patient progress toward functional and medical goals  Care Tool:  Bathing    Body parts bathed by patient: Right arm, Left arm, Chest, Abdomen, Face, Right upper leg, Left upper leg, Front perineal area, Buttocks, Right lower leg, Left lower leg         Bathing assist Assist Level: Minimal Assistance - Patient > 75% (For standing balance.)     Upper Body Dressing/Undressing Upper body dressing   What is the patient wearing?: Pull over shirt    Upper body assist Assist Level: Set up assist    Lower Body  Dressing/Undressing Lower body dressing      What is the patient wearing?: Underwear/pull up, Pants     Lower body assist Assist for lower body dressing: Minimal Assistance - Patient > 75% (For standing balance.)     Toileting Toileting    Toileting assist Assist for toileting: Minimal Assistance - Patient > 75%     Transfers Chair/bed transfer  Transfers assist     Chair/bed transfer assist level: Moderate Assistance - Patient 50 - 74%     Locomotion Ambulation   Ambulation assist      Assist level: Maximal Assistance - Patient 25 - 49% Assistive device: Hand held assist Max distance: 20f   Walk 10 feet activity   Assist     Assist level: Maximal Assistance - Patient 25 - 49% Assistive device: Hand held assist   Walk 50  feet activity   Assist    Assist level: Maximal Assistance - Patient 25 - 49% Assistive device: Hand held assist    Walk 150 feet activity   Assist Walk 150 feet activity did not occur: Safety/medical concerns         Walk 10 feet on uneven surface  activity   Assist Walk 10 feet on uneven surfaces activity did not occur: Safety/medical concerns         Wheelchair     Assist Is the patient using a wheelchair?: Yes (for transport) Type of Wheelchair: Manual    Wheelchair assist level: Dependent - Patient 0%      Wheelchair 50 feet with 2 turns activity    Assist        Assist Level: Dependent - Patient 0%   Wheelchair 150 feet activity     Assist      Assist Level: Dependent - Patient 0%   Blood pressure (!) 149/60, pulse 69, temperature 97.8 F (36.6 C), resp. rate 17, height '4\' 11"'$  (1.499 m), weight 49.9 kg, SpO2 99 %.  Medical Problem List and Plan: 1. 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  9 mm right occipital pole infarct.  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             -patient may  shower             -ELOS/Goals: 12/10/22          Continue CIR, PT/OT/SLP  -DC IV 2.  Antithrombotics: -DVT/anticoagulation:  Pharmaceutical: Lovenox             -antiplatelet therapy: Aspirin 81 mg daily and Plavix 75 mg daily x 21 days then Plavix alone 3. Pain Management: Neurontin 300 mg 3 times daily at home. Will decrease to 300 BID based on pharmacy recommendation.  Tylenol as needed Severe OA left knee, cont Voltaren , discussed rec for no elective surgery for ~667moost stroke  4. Mood/Behavior/Sleep: Cymbalta 30 mg daily provide emotional support             -antipsychotic agents: N/A 5.  Neuropsych/cognition: This patient is capable of making decisions on her own behalf. 6. Skin/Wound Care: Routine skin checks 7. Fluids/Electrolytes/Nutrition: Routine in and outs with follow-up chemistries 8.  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  Latest creatinine improved 1.28.  Follow-up chemistries 9.  Aspiration pneumonia finishing course of Augmentin 10.  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  Follow-up outpatient as needed.  Follow-up CT scan with IV contrast if possible in a few weeks.  If still present consider MRI. 11.  Hypothyroidism.  Synthroid 12.  Chronic diastolic congestive heart failure.  Monitor for any signs of fluid overload. Monitor weight -12/25 weight appears stable, no signs overload   Filed Weights   12/01/22 0558 12/03/22 0500 12/04/22 0423  Weight: 50.8 kg 50 kg 49.9 kg    13.  Iron deficiency anemia.  Follow-up CBC.  Continue Niferex daily 14.  History of asthma.  Continue inhalers as directed as well as chronic prednisone 10 mg daily 15.  Hyperlipidemia.  Lipitor 40 mg 16.  Hypertension.  Will hold off on restarting Norvasc 10 mg daily, Inderal 60 mg daily. Consider restarting if needed.  Losartan stopped due to AKI.  Well-controlled overall.  Monitor with increased   -12/26 Restart inderal '60mg'$  12/27 will d/c inderal due to worsening orthostasis    12/04/2022    4:23 AM 12/04/2022    3:54 AM 12/04/2022    3:52 AM  Vitals with BMI  Weight 110 lbs    BMI 58.52    Systolic  778 242  Diastolic  60 68  Pulse  69 75    17.  Abdominal pain and coffee-ground emesis.  Evaluated by GI no procedures recommended.  Continue pantoprazole and follow-up with GI outpatient.  May need outpatient EGD. 18.  Hypomagnesemia.  Recheck level  -12/22 1.7 today, add 1 tab slow mg daily  12/25 MG 1.8 today stable 19.  Hypokalemia.  Recheck level 20. Constipation: resolved, stool now loose, discussed initial constipation could have been from iron 21. Fatigue  -Check  orthostatic VS, Ted hose 22. Hypokalemia  -12/22 Replete with KCL, improved to 3.5 -12/25 recheck tomorrow 23. Loose stool: d/c miralax, resolved. D/c magnesium.  24. Hallucinations:resolved  UA/UC ordered.   -U/A with WBC, many bact , neg nitrite, UCx >100K Kleb P , start keflex    Occipital infarcts may be causing visual misperceptions   LOS: 7 days A FACE TO FACE EVALUATION WAS PERFORMED  Charlett Blake 12/04/2022, 8:31 AM

## 2022-12-04 NOTE — Group Note (Signed)
Patient Details Name: AVIELLE IMBERT MRN: [id number] DOB: 09-02-1941 Today's Date: 12/04/2022     Group Description: Stress management: Pt participated in group session with a focus on stress mgmt, education provided on healthy coping strategies, and social interaction. Focus of session on providing coping strategies to manage new diagnosis to allow for improved mental health to increase overall quality of life . Discussed how to break down stressors into "daily hassles," "major life stressors" and "life circumstances" in an effort to allow pts to chunk their stressors into groups and determine where to best put their efforts/time when dealing with stress. Provided active listening, emotional support and therapeutic use of self. Offered education on factors that protect [place] against stress such as "daily uplifts," "healthy coping strategies" and "protective factors." Encouraged all group members to make an effort to actively recall one event from their day that was a daily uplift in an effort to protect their mindset from stressors as well as sharing this information with their caregivers to facilitate improved caregiver communication and decrease overall burden of care.  Issued pt handouts on healthy coping strategies to implement into routine.   Individual level documentation: Patient participated with full collaboration during session. Pt husband was also present and participatory.  Pain:no c/o      Margie Urbanowicz 12/04/2022, 3:51 PM

## 2022-12-04 NOTE — Evaluation (Signed)
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 Active Problems:   Hypomagnesemia     Past Medical History:      Past Medical History:  Diagnosis Date   Anemia     Arthritis     Asthma      uses inhaler just prior to surgery to avoid attack   Back pain      from previous injury   Complication of anesthesia      has woken  up during 2 different surgery   Depression      no current issue/treatment; situation   Gallstones     GERD (gastroesophageal reflux disease)     Hiatal hernia      patient does NOT have nerve/muscle disease   History of kidney stones     HLD (hyperlipidemia)     HTN (hypertension)     Hypothyroidism     Kidney stones     Knee pain     Non-diabetic pancreatic hormone dysfunction years    pt. 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   Pancreatitis     Pneumonia     Seizures ([place])      caused by dye injected during a procedure   Shortness of breath      with exertion   Sinus problem      frequent infections/congestion   Stroke ([place]) 2021    reports having CVA in 2021 and having mini strokes before that   Thyroid disease      Past Surgical History:       Past Surgical History:  Procedure Laterality Date   ABDOMINAL HYSTERECTOMY       APPENDECTOMY       CARPAL TUNNEL RELEASE   10+ years ago    bilateral   EYE SURGERY   3 yrs ago    bilateral cataracts   FOOT OSTEOTOMY   6 weeks ago    Left foot: great, 2nd & 3rd   FOOT OSTEOTOMY   5 years ago    Right great toe   HAND SURGERY Bilateral 2011-most recent    multiple hand surgeries, 2 on left, 3 on right   KNEE ARTHROPLASTY Right 04/28/2022    Procedure: COMPUTER ASSISTED TOTAL KNEE ARTHROPLASTY;  Surgeon: Dereck Leep, MD;  Location: [facility]  ORS;  Service: Orthopedics;  Laterality: Right;   LOOP RECORDER INSERTION N/A 05/16/2020    Procedure: LOOP RECORDER INSERTION;  Surgeon: Isaias Cowman, MD;  Location: [facility];  Service: Cardiovascular;  Laterality: N/A;   NASAL SINUS SURGERY   most recent 7-8 yrs ago    7 sinus surgeries    TRIGGER FINGER RELEASE   11/19/2011    Procedure: RELEASE TRIGGER FINGER/A-1 PULLEY;  Surgeon: Wynonia Sours, MD;  Location: [facility];  Service: Orthopedics;  Laterality: Right;  release a-1 pulley right index finger and cyst removal   WRIST GANGLION EXCISION   1980's    right      Assessment & Plan Clinical Impression: Patient is a 81 y.o. right-handed female with history of recent stroke with left-sided weakness maintained on aspirin and Plavix/loop recorder placement received inpatient rehab services 08/22/2022 - 09/05/2022 discharge to home ambulating 150 feet with a rollator, hypertension, hyperlipidemia, asthma, pancreatitis, chronic  anemia, diastolic congestive heart failure.  Per chart review patient lives with spouse.  Two-level home bed and bath main level 2 steps to entry.  Presented to [facility] 11/23/2022 with increasing left-sided weakness nausea vomiting nonspecific abdominal pain.  Cranial CT scan negative for acute changes.  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  New compared to 08/19/2022.  No other intracranial arterial occlusion or high-grade stenosis.  Patient did not receive tPA.  MRI of the brain showed extensive acute nonhemorrhagic infarct involving the posterior inferior cerebellum bilaterally right greater than left.  9 mm right occipital pole infarct.  Punctate white matter infarct in the left occipital lobe.  Additional punctate cortical infarct in the medial right occipital lobe more superiorly.  Echocardiogram with ejection fraction of 55 to 60% no wall motion  abnormalities grade 1 diastolic dysfunction.  CT abdomen pelvis showed a 8.4 cm masslike inflammatory/edematous changes in the anterior pelvic fat cephalad to the urinary bladder, possibly contusion versus neoplasm.  Admission chemistries unremarkable except glucose 179 creatinine 1.79, urine drug screen negative, troponin negative, blood cultures no growth to date hemoglobin 10.5, WBC 11,600.  Neurology follow-up initially placed on IV heparin and currently remains on aspirin and Plavix for CVA prophylaxis x 21 days then Plavix alone.  Lovenox added for DVT prophylaxis.  Gastroenterology services consulted Dr. Virgina Jock in regards to coffee-ground emesis with CT abdomen pelvis reviewed not felt to be GI bleed consider CTA if worsening but no plan for endoscopy or further workup at this time.  Patient was cleared to continue aspirin and Plavix therapy.  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  AKI improved with gentle IV fluids latest creatinine 1.28.  Question aspiration pneumonia initially on Unasyn and transition to Augmentin finishing course.  Cardiology interrogated loop recorder and no events noted. Tolerating a regular consistency diet.  Therapy evaluations completed due to patient's decreased functional mobility left-sided weakness was admitted for a comprehensive rehab program.   Patient transferred to CIR on 11/27/2022 .    Pt presents with decreased activity tolerance, decreased functional mobility, decreased balance, decreased coordination, decreased attention, decreased memory, decreased problem solving, feelings of stress limiting pt's independence with leisure/community pursuits.  Met with pt and husband today to discuss TR services including leisure education, activity analysis/modifications and stress management.  Also discussed the importance of social, emotional, spiritual health in addition to physical health and their effects on overall health and wellness.   Pt stated understanding.    Plan   Min 1 TR session >20 minutes during LOS Recommendations for other services: None   Discharge Criteria: Patient will be discharged from TR if patient refuses treatment 3 consecutive times without medical reason.  If treatment goals not met, if there is a change in medical status, if patient makes no progress towards goals or if patient is discharged from hospital.  The above assessment, treatment plan, treatment alternatives and goals were discussed and mutually agreed upon: by patient  Superior 12/04/2022, 8:51 AM

## 2022-12-04 NOTE — Progress Notes (Signed)
Patient ID: [name], female   DOB: 12-28-1940, 81 y.o.   MRN: [id number]  OP referral faxed to [facility].

## 2022-12-04 NOTE — Group Note (Signed)
Patient Details Name: [name] MRN: [id number] DOB: Apr 12, 1941 Today's Date: 12/04/2022  Time Calculation: OT Group Time Calculation OT Group Start Time: 9532 OT Group Stop Time: 0233 OT Group Time Calculation (min): 61 min      Group Description: Stress management: Pt participated in group session with a focus on stress mgmt, education provided on healthy coping strategies, and social interaction. Focus of session on providing coping strategies to manage new diagnosis to allow for improved mental health to increase overall quality of life . Discussed how to break down stressors into "daily hassles," "major life stressors" and "life circumstances" in an effort to allow pts to chunk their stressors into groups and determine where to best put their efforts/time when dealing with stress. Provided active listening, emotional support and therapeutic use of self. Offered education on factors that protect [place] against stress such as "daily uplifts," "healthy coping strategies" and "protective factors." Encouraged all group members to make an effort to actively recall one event from their day that was a daily uplift in an effort to protect their mindset from stressors as well as sharing this information with their caregivers to facilitate improved caregiver communication and decrease overall burden of care.  Issued pt handouts on healthy coping strategies to implement into routine.   Individual level documentation: Patient participated with full collaboration during session.   Pain: no pain    Precious Haws 12/04/2022, 4:00 PM

## 2022-12-05 DIAGNOSIS — I639 Cerebral infarction, unspecified: Secondary | ICD-10-CM | POA: Diagnosis not present

## 2022-12-05 NOTE — Progress Notes (Shared)
Inpatient Rehabilitation Care Coordinator Discharge Note   Patient Details  Name: [name] MRN: [id number] Date of Birth: 11-10-1941   Discharge location: Home  Length of Stay: 11 Days  Discharge activity level: sup/cga  Home/community participation: spouse, [name]  Patient response [id number] Literacy - How often do you need to have someone help you when you read instructions, pamphlets, or other written material from your doctor or pharmacy?: Never  Patient response [id number] Isolation - How often do you feel lonely or isolated from those around you?: Rarely  Services provided included: MD, RD, PT, OT, SLP, RN, CM, TR, Pharmacy, Neuropsych, SW  Financial Services:  Charity fundraiser Utilized: [place] [employer]  Choices offered to/list presented to:    Follow-up services arranged:  Outpatient    Outpatient Servicies: [facility]      Patient response to transportation need: Is the patient able to respond to transportation needs?: Yes In the past 12 months, has lack of transportation kept you from medical appointments or from getting medications?: No In the past 12 months, has lack of transportation kept you from meetings, work, or from getting things needed for daily living?: No    Comments (or additional information):  Patient/Family verbalized understanding of follow-up arrangements:  Yes  Individual responsible for coordination of the follow-up plan: [name]  Confirmed correct DME delivered: Dyanne Iha 12/05/2022    Dyanne Iha

## 2022-12-05 NOTE — Progress Notes (Signed)
PROGRESS NOTE   Subjective/Complaints:  In OT working with putty LUE  ROS- no breathing issues , no MSK pain  Objective:   No results found. No results for input(s): "WBC", "HGB", "HCT", "PLT" in the last 72 hours.  Recent Labs    12/02/22 0948 12/04/22 0557  NA 135  --   K 3.7  --   CL 103  --   CO2 27  --   GLUCOSE 108*  --   BUN 17  --   CREATININE 1.28* 0.97  CALCIUM 9.3  --      Intake/Output Summary (Last 24 hours) at 12/05/2022 0827 Last data filed at 12/05/2022 0815 Gross per 24 hour  Intake 960 ml  Output --  Net 960 ml         Physical Exam: Vital Signs Blood pressure (!) 126/58, pulse 74, temperature 98.2 F (36.8 C), resp. rate 16, height '4\' 11"'$  (1.499 m), weight 49.9 kg, SpO2 100 %.   General: No acute distress Mood and affect are appropriate Heart: Regular rate and rhythm no rubs murmurs or extra sounds Lungs: Clear to auscultation, breathing unlabored, no rales or wheezes Abdomen: Positive bowel sounds, soft nontender to palpation, nondistended Extremities: No clubbing, cyanosis, or edema    Skin: warm and dry Neuro: Alert and oriented to place, name, month and year and situation.  Follows commands.  Left facial droop.  EOMI.  PERRL Sensation intact light touch in all 4 extremities Strength 5 out of 5 in right lower extremities and right upper extremity Strength 4+ out of 5 left lower extremity Strength 3 out of 5 proximal left upper extremity to 4- out of 5 distal right upper extremity Musculoskeletal: No joint swelling or tenderness noted, no abnormal range of motion noted No evidence of Dupuytren's contracture in Left hand , extensory lag left index finger, tone related   Assessment/Plan: 1. Functional deficits which require 3+ hours per day of interdisciplinary therapy in a comprehensive inpatient rehab setting. Physiatrist is providing close team supervision and 24 hour  management of active medical problems listed below. Physiatrist and rehab team continue to assess barriers to discharge/monitor patient progress toward functional and medical goals  Care Tool:  Bathing    Body parts bathed by patient: Right arm, Left arm, Chest, Abdomen, Face, Right upper leg, Left upper leg, Front perineal area, Buttocks, Right lower leg, Left lower leg         Bathing assist Assist Level: Minimal Assistance - Patient > 75% (For standing balance.)     Upper Body Dressing/Undressing Upper body dressing   What is the patient wearing?: Pull over shirt    Upper body assist Assist Level: Set up assist    Lower Body Dressing/Undressing Lower body dressing      What is the patient wearing?: Underwear/pull up, Pants     Lower body assist Assist for lower body dressing: Minimal Assistance - Patient > 75% (For standing balance.)     Toileting Toileting    Toileting assist Assist for toileting: Minimal Assistance - Patient > 75%     Transfers Chair/bed transfer  Transfers assist     Chair/bed transfer assist level: Moderate  Assistance - Patient 50 - 74%     Locomotion Ambulation   Ambulation assist      Assist level: Maximal Assistance - Patient 25 - 49% Assistive device: Hand held assist Max distance: 68f   Walk 10 feet activity   Assist     Assist level: Maximal Assistance - Patient 25 - 49% Assistive device: Hand held assist   Walk 50 feet activity   Assist    Assist level: Maximal Assistance - Patient 25 - 49% Assistive device: Hand held assist    Walk 150 feet activity   Assist Walk 150 feet activity did not occur: Safety/medical concerns         Walk 10 feet on uneven surface  activity   Assist Walk 10 feet on uneven surfaces activity did not occur: Safety/medical concerns         Wheelchair     Assist Is the patient using a wheelchair?: Yes (for transport) Type of Wheelchair: Manual    Wheelchair  assist level: Dependent - Patient 0%      Wheelchair 50 feet with 2 turns activity    Assist        Assist Level: Dependent - Patient 0%   Wheelchair 150 feet activity     Assist      Assist Level: Dependent - Patient 0%   Blood pressure (!) 126/58, pulse 74, temperature 98.2 F (36.8 C), resp. rate 16, height '4\' 11"'$  (1.499 m), weight 49.9 kg, SpO2 100 %.  Medical Problem List and Plan: 1. 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  9 mm right occipital pole infarct.  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             -patient may  shower             -ELOS/Goals: 12/10/22          Continue CIR, PT/OT/SLP  -DC IV 2.  Antithrombotics: -DVT/anticoagulation:  Pharmaceutical: Lovenox             -antiplatelet therapy: Aspirin 81 mg daily and Plavix 75 mg daily x 21 days then Plavix alone 3. Pain Management: Neurontin 300 mg 3 times daily at home. Will decrease to 300 BID based on pharmacy recommendation.  Tylenol as needed Severe OA left knee, cont Voltaren , discussed rec for no elective surgery for ~694moost stroke  4. Mood/Behavior/Sleep: Cymbalta 30 mg daily provide emotional support             -antipsychotic agents: N/A 5. Neuropsych/cognition: This patient is capable of making decisions on her own behalf. 6. Skin/Wound Care: Routine skin checks 7. Fluids/Electrolytes/Nutrition: Routine in and outs with follow-up chemistries 8.  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  Latest creatinine improved 1.28.  Follow-up chemistries 9.  Aspiration pneumonia finishing course of Augmentin 10.  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  Follow-up outpatient as needed.  Follow-up CT scan with IV contrast if possible in a few weeks.  If still  present consider MRI. 11.  Hypothyroidism.  Synthroid 12.  Chronic diastolic congestive heart failure.  Monitor for any signs of fluid overload. Monitor weight -12/25 weight appears stable, no signs overload   Filed Weights   12/01/22 0558 12/03/22 0500 12/04/22 0423  Weight: 50.8 kg 50 kg 49.9 kg  13.  Iron deficiency anemia.  Follow-up CBC.  Continue Niferex daily 14.  History of asthma.  Continue inhalers as directed as well as chronic prednisone 10 mg daily 15.  Hyperlipidemia.  Lipitor 40 mg 16.  Hypertension.  Will hold off on restarting Norvasc 10 mg daily, Inderal 60 mg daily. Consider restarting if needed.  Losartan stopped due to AKI.  Well-controlled overall.  Monitor with increased   -12/26 Restart inderal '60mg'$  12/27 will d/c inderal due to worsening orthostasis    12/05/2022    5:10 AM 12/04/2022    8:13 PM 12/04/2022    4:31 PM  Vitals with BMI  Systolic [phone]  Diastolic 58 53 66  Pulse 74 78 72    17.  Abdominal pain and coffee-ground emesis.  Evaluated by GI no procedures recommended.  Continue pantoprazole and follow-up with GI outpatient.  May need outpatient EGD. 18.  Hypomagnesemia.  Recheck level  -12/22 1.7 today, add 1 tab slow mg daily  12/25 MG 1.8 today stable 19.  Hypokalemia.  Recheck level 20. Constipation: resolved, stool now loose, discussed initial constipation could have been from iron 21. Fatigue  -Check orthostatic VS, Ted hose 22. Hypokalemia  -12/22 Replete with KCL, improved to 3.5 -12/25 recheck tomorrow 23. Loose stool: d/c miralax, resolved. D/c magnesium.  24. Hallucinations:resolved  UA/UC ordered.   -U/A with WBC, many bact , neg nitrite, UCx >100K Kleb P , start keflex    Occipital infarcts may be causing visual misperceptions   LOS: 8 days A FACE TO FACE EVALUATION WAS PERFORMED  Charlett Blake 12/05/2022, 8:27 AM

## 2022-12-05 NOTE — Progress Notes (Signed)
Patient ID: [name], female   DOB: 08-18-1941, 81 y.o.   MRN: [id number]  No DME required per pt and therapy. OP referral sent to [place].

## 2022-12-05 NOTE — Progress Notes (Signed)
Speech Language Pathology Daily Session Note  Patient Details  Name: [name] MRN: [id number] Date of Birth: 02/19/1941  Today's Date: 12/05/2022 SLP Individual Time: 1330-1403 SLP Individual Time Calculation (min): 33 min  Short Term Goals: Week 2: SLP Short Term Goal 1 (Week 2): STGs=LTGs due to ELOS  Skilled Therapeutic Interventions: Skilled treatment session focused on cognitive goals. SLP facilitated session by providing extra time for patient to complete a complex problem solving and organizational task of organizing a TID pill box. Patient demonstrated selective attention to task in a moderately distracting environment for 20 minutes independently. Patient left upright in wheelchair with alarm on and all needs within reach. Continue with current plan of care.      Pain No/Denies Pain   Therapy/Group: Individual Therapy  Lajoy Vanamburg 12/05/2022, 2:21 PM

## 2022-12-05 NOTE — Progress Notes (Signed)
Physical Therapy Session Note  Patient Details  Name: [name] MRN: [id number] Date of Birth: [birth year]  Today's Date: 12/05/2022 PT Individual Time: 1415-1500 PT Individual Time Calculation (min): 45 min   Short Term Goals: Week 1:  PT Short Term Goal 1 (Week 1): Pt will perform sit<>stands using LRAD with min assist PT Short Term Goal 2 (Week 1): Pt will perform bed<>chair transfers using LRAD with min assist PT Short Term Goal 3 (Week 1): Pt will ambulate at least 60f using LRAD with min assist PT Short Term Goal 4 (Week 1): Pt will perform 4 stair navigation using HRs with no more than mod assist PT Short Term Goal 5 (Week 1): Pt will improve score on Berg Balance Test by at least 7 points  Skilled Therapeutic Interventions/Progress Updates:    [facility] instructed pt in dynamic balance training in parallel bars. Semitandem stance 2 x 30 sec ,  1 foot on 6inch step with 30 sec hold forward  x2 bil and lateral x 1 bil. Red wedge. Reciprocal foot tap on 6 inch step with no UE support 2 x 6 BLE Pt provided supervision assist-CGA for safety and cues for safety to decrease speed of movement and improve sustained attention to task. Due to wide BOS while standing between reps.   Gait training through rehab gym with RW x 1560fwith supervision assist CGA for safety.    Pt returned to room and performed stand pivot transfer to bed with CGA and no AD. Sit>supine completed without assist, and left supine in bed with call bell in reach and all needs met.        Therapy Documentation Precautions:  Precautions Precautions: Fall Precaution Comments: Aspiration Restrictions Weight Bearing Restrictions: No  Vital Signs: Therapy Vitals Temp: (!) 97.5 F (36.4 C) Temp Source: Oral Pulse Rate: 81 Resp: 16 BP: 133/64 Patient Position (if appropriate): Lying Oxygen Therapy SpO2: 99 % O2 Device: Room Air Pain: denies   Therapy/Group: Individual  Therapy  AuLorie Phenix2/29/2023, 3:25 PM

## 2022-12-05 NOTE — Progress Notes (Signed)
Occupational Therapy Session Note  Patient Details  Name: [name] MRN: [id number] Date of Birth: 04/16/1941  Today's Date: 12/05/2022 OT Individual Time: 0805-0904 OT Individual Time Calculation (min): 59 min    Short Term Goals: Week 1:  OT Short Term Goal 1 (Week 1): Pt will perform 3/3 toileting activities with CGA + LRAD. OT Short Term Goal 2 (Week 1): Pt will perform bathing activities with supervision + Min safety cues. OT Short Term Goal 3 (Week 1): Pt will perform full body dressing with supervision + Min safety cues.  Skilled Therapeutic Interventions/Progress Updates:  Pt received seated EOB with husband present for skilled OT session with focus on L-hand strengthening and caregiver training. Pt agreeable to interventions, demonstrating overall pleasant mood. Pt with no reports of pain, stating "nothing is hurting right now." OT offering intermediate rest breaks and positioning suggestions throughout session to address pain/fatigue and maximize participation/safety in session.   Pt dependent for WC transport from room<>therapy gym for time management.   In therapy gym, pt participates in series of L-hand strengthening exercises with use of tan thera-putty. Pt performs the following exercises for 1 set/5-10 reps:  -Gross Hand and Wrist Movement (rolling a cylinder shape) -Gross Finger Flexion (squeezing putty into a ball) -Isolated Opposition -Individual Finger Adduction  Pt then completes sink-level oral care in standing with CGA+sink edge. Pt sits to brush hair with item retrieval.   Pt's husband (David) trained/observed assisting patient with room-level mobility and toilet transfers. Pt and husband educated on the importance/technique of using a gait belt and communication throughout transfers to reduce fall risk. Pt and husband receptive of education, eager for further training.   Pt remained seated in WC with all immediate needs met at end of session. Pt continues to  be appropriate for skilled OT intervention to promote further functional independence.   Therapy Documentation Precautions:  Precautions Precautions: Fall Precaution Comments: Aspiration Restrictions Weight Bearing Restrictions: No    Therapy/Group: Individual Therapy  Francy Pachon-Marin, OTR/L, MSOT  12/05/2022, 7:53 AM 

## 2022-12-05 NOTE — Progress Notes (Signed)
Occupational Therapy Session Note  Patient Details  Name: [name] MRN: [id number] Date of Birth: [birth year]  Today's Date: 12/05/2022 OT Individual Time: 1100-1200 OT Individual Time Calculation (min): 60 min    Short Term Goals: Week 1:  OT Short Term Goal 1 (Week 1): Pt will perform 3/3 toileting activities with CGA + LRAD. OT Short Term Goal 2 (Week 1): Pt will perform bathing activities with supervision + Min safety cues. OT Short Term Goal 3 (Week 1): Pt will perform full body dressing with supervision + Min safety cues.  Skilled Therapeutic Interventions/Progress Updates:     Pain reported during session as 0/10. Patient actively participated in the [place] program in a group setting for social participation with 1:1 instrucion per pt. Session focused on education and training in breathing techniques to regulate the nervous system, gentle yoga poses with a focus on balnce and L NMR, guided meditation to regulate attention and guided discussion on the topic of positivity. Skilled treatment interventions include. Patient required mod demo cues and CGA assist for standing warrior poses. Exited session with pt seated in w/c, with husband in room and call light in reach   Therapy Documentation Precautions:  Precautions Precautions: Fall Precaution Comments: Aspiration Restrictions Weight Bearing Restrictions: No General:   Therapy/Group: Individual Therapy  Tonny Branch 12/05/2022, 5:56 AM

## 2022-12-06 DIAGNOSIS — Z8673 Personal history of transient ischemic attack (TIA), and cerebral infarction without residual deficits: Secondary | ICD-10-CM | POA: Diagnosis not present

## 2022-12-06 NOTE — Progress Notes (Signed)
PROGRESS NOTE   Subjective/Complaints:  Pt reports no issues BP is higher than normal- and pulse still higher- 116 with shower; 105 at rest.   Per therapy unsteady with BP changes- with shower for YEARS.    ROS- Pt denies SOB, abd pain, CP, N/V/C/D, and vision changes    Objective:   No results found. No results for input(s): "WBC", "HGB", "HCT", "PLT" in the last 72 hours.  Recent Labs    12/04/22 0557  CREATININE 0.97    Intake/Output Summary (Last 24 hours) at 12/06/2022 1410 Last data filed at 12/06/2022 1350 Gross per 24 hour  Intake 360 ml  Output --  Net 360 ml        Physical Exam: Vital Signs Blood pressure (!) 135/94, pulse 90, temperature 98.4 F (36.9 C), resp. rate 18, height '4\' 11"'$  (1.499 m), weight 49.9 kg, SpO2 96 %.    General: awake, alert, appropriate, sitting EOB with therapy after shower- husband at bedside;  NAD HENT: conjugate gaze; oropharynx moist CV: mildly tachycardic rate; no JVD Pulmonary: CTA B/L; no W/R/R- good air movement GI: soft, NT, ND, (+)BS Psychiatric: appropriate Neurological: alert  Skin: warm and dry Neuro: Alert and oriented to place, name, month and year and situation.  Follows commands.  Left facial droop.  EOMI.  PERRL Sensation intact light touch in all 4 extremities Strength 5 out of 5 in right lower extremities and right upper extremity Strength 4+ out of 5 left lower extremity Strength 3 out of 5 proximal left upper extremity to 4- out of 5 distal right upper extremity Musculoskeletal: No joint swelling or tenderness noted, no abnormal range of motion noted No evidence of Dupuytren's contracture in Left hand , extensory lag left index finger, tone related   Assessment/Plan: 1. Functional deficits which require 3+ hours per day of interdisciplinary therapy in a comprehensive inpatient rehab setting. Physiatrist is providing close team supervision and  24 hour management of active medical problems listed below. Physiatrist and rehab team continue to assess barriers to discharge/monitor patient progress toward functional and medical goals  Care Tool:  Bathing    Body parts bathed by patient: Right arm, Left arm, Chest, Abdomen, Face, Right upper leg, Left upper leg, Front perineal area, Buttocks, Right lower leg, Left lower leg         Bathing assist Assist Level: Minimal Assistance - Patient > 75% (For standing balance.)     Upper Body Dressing/Undressing Upper body dressing   What is the patient wearing?: Pull over shirt    Upper body assist Assist Level: Set up assist    Lower Body Dressing/Undressing Lower body dressing      What is the patient wearing?: Underwear/pull up, Pants     Lower body assist Assist for lower body dressing: Minimal Assistance - Patient > 75% (For standing balance.)     Toileting Toileting    Toileting assist Assist for toileting: Minimal Assistance - Patient > 75%     Transfers Chair/bed transfer  Transfers assist     Chair/bed transfer assist level: Moderate Assistance - Patient 50 - 74%     Locomotion Ambulation   Ambulation assist  Assist level: Maximal Assistance - Patient 25 - 49% Assistive device: Hand held assist Max distance: 27f   Walk 10 feet activity   Assist     Assist level: Maximal Assistance - Patient 25 - 49% Assistive device: Hand held assist   Walk 50 feet activity   Assist    Assist level: Maximal Assistance - Patient 25 - 49% Assistive device: Hand held assist    Walk 150 feet activity   Assist Walk 150 feet activity did not occur: Safety/medical concerns         Walk 10 feet on uneven surface  activity   Assist Walk 10 feet on uneven surfaces activity did not occur: Safety/medical concerns         Wheelchair     Assist Is the patient using a wheelchair?: Yes (for transport) Type of Wheelchair: Manual     Wheelchair assist level: Dependent - Patient 0%      Wheelchair 50 feet with 2 turns activity    Assist        Assist Level: Dependent - Patient 0%   Wheelchair 150 feet activity     Assist      Assist Level: Dependent - Patient 0%   Blood pressure (!) 135/94, pulse 90, temperature 98.4 F (36.9 C), resp. rate 18, height '4\' 11"'$  (1.499 m), weight 49.9 kg, SpO2 96 %.  Medical Problem List and Plan: 1. 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  9 mm right occipital pole infarct.  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             -patient may  shower             -ELOS/Goals: 12/10/22          Con't CIR- PT, OT and SLP  Per OT, gets unsteady after showers for "years". They saw signs of this 2.  Antithrombotics: -DVT/anticoagulation:  Pharmaceutical: Lovenox             -antiplatelet therapy: Aspirin 81 mg daily and Plavix 75 mg daily x 21 days then Plavix alone 3. Pain Management: Neurontin 300 mg 3 times daily at home. Will decrease to 300 BID based on pharmacy recommendation.  Tylenol as needed Severe OA left knee, cont Voltaren , discussed rec for no elective surgery for ~638moost stroke   12/30- pain tolerable- con't regimen 4. Mood/Behavior/Sleep: Cymbalta 30 mg daily provide emotional support             -antipsychotic agents: N/A 5. Neuropsych/cognition: This patient is capable of making decisions on her own behalf. 6. Skin/Wound Care: Routine skin checks 7. Fluids/Electrolytes/Nutrition: Routine in and outs with follow-up chemistries 8.  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  Latest creatinine improved 1.28.  Follow-up chemistries 9.  Aspiration pneumonia finishing course of Augmentin 10.  Mesenteric mass.  Cleared by surgery felt to be likely  contusion.  Follow-up outpatient as needed.  Follow-up CT scan with IV contrast if possible in a few weeks.  If still present consider MRI. 11.  Hypothyroidism.  Synthroid 12.  Chronic diastolic congestive heart failure.  Monitor for any signs of fluid overload. Monitor weight -12/25 weight appears stable, no signs overload  12/30- weight reducing- but last weight 12/28- will speak with nursing about this. [facility]   12/01/22 0558 12/03/22  0500 12/04/22 0423  Weight: 50.8 kg 50 kg 49.9 kg    13.  Iron deficiency anemia.  Follow-up CBC.  Continue Niferex daily 14.  History of asthma.  Continue inhalers as directed as well as chronic prednisone 10 mg daily 15.  Hyperlipidemia.  Lipitor 40 mg 16.  Hypertension.  Will hold off on restarting Norvasc 10 mg daily, Inderal 60 mg daily. Consider restarting if needed.  Losartan stopped due to AKI.  Well-controlled overall.  Monitor with increased   -12/26 Restart inderal '60mg'$  12/27 will d/c inderal due to worsening orthostasis  12/30- Tachycardia worse with stopping inderal and BP rising some- in [id number] systolic.     12/06/2022    1:51 PM 12/06/2022    4:07 AM 12/05/2022    9:30 PM  Vitals with BMI  Systolic 737 106 269  Diastolic 94 64 51  Pulse 90 71 83    17.  Abdominal pain and coffee-ground emesis.  Evaluated by GI no procedures recommended.  Continue pantoprazole and follow-up with GI outpatient.  May need outpatient EGD. 18.  Hypomagnesemia.  Recheck level  -12/22 1.7 today, add 1 tab slow mg daily  12/25 MG 1.8 today stable 19.  Hypokalemia.  Recheck level 20. Constipation: resolved, stool now loose, discussed initial constipation could have been from iron 21. Fatigue  -Check orthostatic VS, Ted hose 22. Hypokalemia  -12/22 Replete with KCL, improved to 3.5 -12/25 recheck tomorrow 12/30- K+ 3.7 23. Loose stool: d/c miralax, resolved. D/c magnesium.  24. Hallucinations:resolved  UA/UC ordered.   -U/A with WBC,  many bact , neg nitrite, UCx >100K Kleb P , start keflex    12/30- Klebsiella pneumonia- per sensitivities, is OK.   Occipital infarcts may be causing visual misperceptions    I spent a total of 41   minutes on total care today- >50% coordination of care- due to d/w team about her shower issues, tachycardia and BP.    LOS: 9 days A FACE TO FACE EVALUATION WAS PERFORMED  Corlene Sabia 12/06/2022, 2:10 PM

## 2022-12-06 NOTE — Progress Notes (Signed)
Occupational Therapy Weekly Progress Note  Patient Details  Name: [name] MRN: [id number] Date of Birth: 04-Jun-1941  Beginning of progress report period: November 28, 2022 End of progress report period: December 05, 2022  Today's Date: 12/06/2022 OT Individual Time: 5248-1859 OT Individual Time Calculation (min): 56 min    Patient has met 3 of 3 short term goals. Pt has made strong progress towards OT POC/goals this reporting period. Pt overall set-up to supervision for seated ADLs, requiring CGA+RW for standing ADLs, and min verbal cuing for safety awareness Pt's husband educated/trained on safe toilet transfers, more detailed education to be scheduled closer to discharge.   Patient continues to demonstrate the following deficits: muscle weakness, decreased cardiorespiratoy endurance, decreased coordination, decreased attention to left, decreased problem solving, decreased safety awareness, and decreased memory, and decreased standing balance and decreased balance strategies and therefore will continue to benefit from skilled OT intervention to enhance overall performance with BADL and Reduce care partner burden.  Patient progressing toward long term goals..  Continue plan of care.  OT Short Term Goals Week 2:  OT Short Term Goal 1 (Week 2): STGs=LTGs due to patient's length of stay.  Skilled Therapeutic Interventions/Progress Updates:  Pt received seated on BSC with NT present for skilled OT session with focus on ADL retraining. Pt agreeable to interventions, demonstrating overall pleasant mood. Pt with no reports of pain, stating "I feel like this hand is doing better (L)." OT offering intermediate rest breaks and positioning suggestions throughout session to address potential pain/fatigue and maximize participation/safety in session.   Pt completes 3/3 toileting activities with supervision + use of grab bars. Pt showers with close supervision/item retrieval, requiring min VC for  safety. Pt observed using L-hand/arm as gross-assist throughout shower without cuing required.   Pt with increased unsteadiness upon completing shower and walking towards bed, requiring increased A to maintain balance. Seated EOB, pt dons shirt with supervision, requring CGA for threading/donning of LB garments due to unsteadiness. Pt with one instance of LOB upon impulsively standing, landing on her bed, pt the educated on the importance of safe/slow pacing and communicating with husband before moving incase A is needed. Pt receptive of education, but unable to carry-over during functional tasks.   Pt performs oral care standing at sink, and ambulating throughout room with CGA + RW.   Pt vitals taken post-shower reading:  -BP=159/79 -HR=117 -O2=99  Pt vitals taken post-dressing reading: -BP=146/74 -HR=95 -O2=99  Pt remained seated in [facility] with husband present and all immediate needs met at end of session. Pt continues to be appropriate for skilled OT intervention to promote further functional independence.   Therapy Documentation Precautions:  Precautions Precautions: Fall Precaution Comments: Aspiration Restrictions Weight Bearing Restrictions: No   Therapy/Group: Individual Therapy  Maudie Mercury, OTR/L, MSOT  12/06/2022, 7:38 AM

## 2022-12-06 NOTE — Progress Notes (Signed)
Physical Therapy Weekly Progress Note  Patient Details  Name: [name] MRN: [id number] Date of Birth: 1941/08/23  Beginning of progress report period: November 28, 2022 End of progress report period: December 06, 2022  Today's Date: 12/06/2022 PT Individual Time: 2585-2778 PT Individual Time Calculation (min): 58 min   Patient has met 5 of 5 short term goals. [name] is making steady progress with physical therapy demonstrating increasing independence with functional mobility; however, continues to be have L hemiparesis, L hemibody and hemi-environment inattention, impaired midline orientation with delayed awareness of LOB, and overall impaired awareness. She is performing supine<>sit with supervision, sit<>stand and stand pivot transfers using RW with primarily CGA occasional min assist, and ambulating ~118f using RW with CGA (occasional light min assist), and navigating 8 stairs using B HRs with CGA/light min assist. She will benefit from continued CIR level skilled physical therapy prior to D/Cing home with 24hr support from her husband, [name]  Patient continues to demonstrate the following deficits muscle weakness, decreased cardiorespiratoy endurance, impaired timing and sequencing, unbalanced muscle activation, decreased coordination, and decreased motor planning, decreased midline orientation, decreased attention to left, and decreased motor planning, decreased initiation, decreased attention, decreased awareness, decreased problem solving, decreased safety awareness, decreased memory, and delayed processing,  , and decreased standing balance, decreased postural control, and decreased balance strategies and therefore will continue to benefit from skilled PT intervention to increase functional independence with mobility.  Patient progressing toward long term goals..  Continue plan of care.  PT Short Term Goals Week 1:  PT Short Term Goal 1 (Week 1): Pt will perform sit<>stands using  LRAD with min assist PT Short Term Goal 1 - Progress (Week 1): Met PT Short Term Goal 2 (Week 1): Pt will perform bed<>chair transfers using LRAD with min assist PT Short Term Goal 2 - Progress (Week 1): Met PT Short Term Goal 3 (Week 1): Pt will ambulate at least 665fusing LRAD with min assist PT Short Term Goal 3 - Progress (Week 1): Met PT Short Term Goal 4 (Week 1): Pt will perform 4 stair navigation using HRs with no more than mod assist PT Short Term Goal 4 - Progress (Week 1): Met PT Short Term Goal 5 (Week 1): Pt will improve score on Berg Balance Test by at least 7 points PT Short Term Goal 5 - Progress (Week 1): Met Week 2:  PT Short Term Goal 1 (Week 2): = to LTGs based on ELOS  Skilled Therapeutic Interventions/Progress Updates:  Ambulation/gait training;DME/adaptive equipment instruction;Community reintegration;Neuromuscular re-education;Stair training;Psychosocial support;UE/LE Strength taining/ROM;Wheelchair propulsion/positioning;Balance/vestibular training;Discharge planning;Functional electrical stimulation;Skin care/wound management;Pain management;Therapeutic Activities;UE/LE Coordination activities;Cognitive remediation/compensation;Disease management/prevention;Functional mobility training;Patient/family education;Splinting/orthotics;Therapeutic Exercise;Visual/perceptual remediation/compensation   Pt received sitting in w/c with her husband, DaShanon Browpresent and pt agreeable to therapy session. Pt and pt's husband report all mobility in room is going well with no concerns following OT training/education yesterday when pt's husband signed off to assist pt with mobility tasks.  Pt already wearing L knee sleeve and reports that she has decided to "wait and deal with the pain" rather than getting the recommended knee brace. Therapist educated pt on notifying her outpatient PT if she changes her mind.  Pt already wearing B LE thigh high TED hose - donned abdominal binder.    Transported to/from gym in w/c for time management and energy conservation.  Sitting in w/c vitals: BP 147/64 (MAP 89), HR 96bpm  Standing wearing thigh high TED hose and abdominal binder: BP 113/57 (MAP 73),  HR 112bpm with HR decreasing to 100bpm in <20sec and remains at 96bpm after 2-3 minutes - pt reports not feeling well, but difficulty describing symptoms.  Therapist reinforced education on importance of ensuring adequate fluid intake to assist with BP management - pt consuming her drink during session.  Gait training ~194f using RW with CGA for steadying/safety - pt continues to have minor excessive L LE adduction with slight increased postural instability - cuing for improvement - vitals in standing afterward: BP 125/71 (MAP 85), HR 123bpm decreased to 109bpm then 99bpm after 2 minutes, SpO2 100%   Patient participated in [employer]and demonstrates increased fall risk as noted by score of  29/56; however, this is a significant improvement compared to 4/56 at initial evaluation when pt unable to stand statically without assistance. Pt does continue to have persistent L posterior>anterior LOB during dynamic balance challenges.(<36= high risk for falls, close to 100%; 37-45 significant >80%; 46-51 moderate >50%; 52-55 lower >25%).  Vitals, in sitting after 254m standing portion of Berg: BP 147/74 (MAP 93), HR 97bpm, SpO2 100%    Balance: Standardized Balance Assessment Standardized Balance Assessment: Berg Balance Test Berg Balance Test Sit to Stand: Able to stand without using hands and stabilize independently Standing Unsupported: Able to stand 2 minutes with supervision Sitting with Back Unsupported but Feet Supported on Floor or Stool: Able to sit safely and securely 2 minutes Stand to Sit: Sits safely with minimal use of hands Transfers: Able to transfer safely, minor use of hands Standing Unsupported with Eyes Closed: Able to stand 10 seconds with supervision (minor  anterior/posterior sway) Standing Ubsupported with Feet Together: Able to place feet together independently and stand for 1 minute with supervision (slight sway) From Standing, Reach Forward with Outstretched Arm: Reaches forward but needs supervision (slight anterior sway) From Standing Position, Pick up Object from Floor: Unable to try/needs assist to keep balance (has anterior LOB (with anterior/posterior sway)) From Standing Position, Turn to Look Behind Over each Shoulder: Needs supervision when turning Turn 360 Degrees: Needs assistance while turning (LOB when turning towards L due to L LE not stepping out enough) Standing Unsupported, Alternately Place Feet on Step/Stool: Able to complete >2 steps/needs minimal assist (persistent L lateral LOB) Standing Unsupported, One Foot in Front: Needs help to step but can hold 15 seconds (repeated L LOB) Standing on One Leg: Unable to try or needs assist to prevent fall Total Score: 29  Provided pt with L RW hand splint based on OT recommendation due to pt continuing to have poor awareness of when placing L hand on/off AD and poor grip positioning - pt reports she likes the orthosis - will continue to trial. Short distance ~[id number] transfer EOM>w/c using RW with L hand orthosis and CGA/light min assist for steadying/balance due to pt moving quickly with minor L LOB.   Transported back to her room and pt left seated in w/c in the care of her husband.  Therapy Documentation Precautions:  Precautions Precautions: Fall Precaution Comments: Aspiration Restrictions Weight Bearing Restrictions: No   Pain:  No reports of pain throughout session.  Therapy/Group: Individual Therapy  CarTawana ScalePT, DPT, NCS, CSRS 12/06/2022, 12:39 PM

## 2022-12-07 DIAGNOSIS — Z8673 Personal history of transient ischemic attack (TIA), and cerebral infarction without residual deficits: Secondary | ICD-10-CM | POA: Diagnosis not present

## 2022-12-07 DIAGNOSIS — F418 Other specified anxiety disorders: Secondary | ICD-10-CM

## 2022-12-07 NOTE — Progress Notes (Signed)
PROGRESS NOTE   Subjective/Complaints:  Pt reports today is free day- surprised her- explained she gets 1 day off/week.   LBM 2 days ago, but feels like needs to go this AM.   Anxious about going home and not overdoing it.    Per therapy unsteady with BP changes- with shower for YEARS.    ROS-  Pt denies SOB, abd pain, CP, N/V/C/D, and vision changes    Objective:   No results found. No results for input(s): "WBC", "HGB", "HCT", "PLT" in the last 72 hours.  No results for input(s): "NA", "K", "CL", "CO2", "GLUCOSE", "BUN", "CREATININE", "CALCIUM" in the last 72 hours.   Intake/Output Summary (Last 24 hours) at 12/07/2022 1114 Last data filed at 12/07/2022 0900 Gross per 24 hour  Intake 660 ml  Output --  Net 660 ml        Physical Exam: Vital Signs Blood pressure (!) 127/49, pulse 75, temperature 98 F (36.7 C), temperature source Oral, resp. rate 18, height '4\' 11"'$  (1.499 m), weight 50.6 kg, SpO2 99 %.     General: awake, alert, appropriate, sitting up in bed; husband at bedside; NAD HENT: conjugate gaze; oropharynx moist CV: regular rate- rate in high 80s; no JVD Pulmonary: CTA B/L; no W/R/R- good air movement GI: soft, NT, ND; slightly hypoactive Psychiatric: appropriate but slightly anxious about d/c when discussed but brighter than yesterday Neurological: Ox3- Skin: warm and dry Neuro: Alert and oriented to place, name, month and year and situation.  Follows commands.  Left facial droop.  EOMI.  PERRL Sensation intact light touch in all 4 extremities Strength 5 out of 5 in right lower extremities and right upper extremity Strength 4+ out of 5 left lower extremity Strength 3 out of 5 proximal left upper extremity to 4- out of 5 distal right upper extremity Musculoskeletal: No joint swelling or tenderness noted, no abnormal range of motion noted No evidence of Dupuytren's contracture in Left hand ,  extensory lag left index finger, tone related   Assessment/Plan: 1. Functional deficits which require 3+ hours per day of interdisciplinary therapy in a comprehensive inpatient rehab setting. Physiatrist is providing close team supervision and 24 hour management of active medical problems listed below. Physiatrist and rehab team continue to assess barriers to discharge/monitor patient progress toward functional and medical goals  Care Tool:  Bathing    Body parts bathed by patient: Right arm, Left arm, Chest, Abdomen, Face, Right upper leg, Left upper leg, Front perineal area, Buttocks, Right lower leg, Left lower leg         Bathing assist Assist Level: Minimal Assistance - Patient > 75% (For standing balance.)     Upper Body Dressing/Undressing Upper body dressing   What is the patient wearing?: Pull over shirt    Upper body assist Assist Level: Set up assist    Lower Body Dressing/Undressing Lower body dressing      What is the patient wearing?: Underwear/pull up, Pants     Lower body assist Assist for lower body dressing: Minimal Assistance - Patient > 75% (For standing balance.)     Toileting Toileting    Toileting assist Assist for toileting: Minimal  Assistance - Patient > 75%     Transfers Chair/bed transfer  Transfers assist     Chair/bed transfer assist level: Moderate Assistance - Patient 50 - 74%     Locomotion Ambulation   Ambulation assist      Assist level: Maximal Assistance - Patient 25 - 49% Assistive device: Hand held assist Max distance: 43f   Walk 10 feet activity   Assist     Assist level: Maximal Assistance - Patient 25 - 49% Assistive device: Hand held assist   Walk 50 feet activity   Assist    Assist level: Maximal Assistance - Patient 25 - 49% Assistive device: Hand held assist    Walk 150 feet activity   Assist Walk 150 feet activity did not occur: Safety/medical concerns         Walk 10 feet on uneven  surface  activity   Assist Walk 10 feet on uneven surfaces activity did not occur: Safety/medical concerns         Wheelchair     Assist Is the patient using a wheelchair?: Yes (for transport) Type of Wheelchair: Manual    Wheelchair assist level: Dependent - Patient 0%      Wheelchair 50 feet with 2 turns activity    Assist        Assist Level: Dependent - Patient 0%   Wheelchair 150 feet activity     Assist      Assist Level: Dependent - Patient 0%   Blood pressure (!) 127/49, pulse 75, temperature 98 F (36.7 C), temperature source Oral, resp. rate 18, height '4\' 11"'$  (1.499 m), weight 50.6 kg, SpO2 99 %.  Medical Problem List and Plan: 1. 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  9 mm right occipital pole infarct.  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             -patient may  shower             -ELOS/Goals: 12/10/22          Con't CIR- PT, OT and SLP- discussed that it's normal to be anxious going home- but I agree to work on not overdoing- slow steady pace.   Per OT, gets unsteady after showers for "years". They saw signs of this  2.  Antithrombotics: -DVT/anticoagulation:  Pharmaceutical: Lovenox             -antiplatelet therapy: Aspirin 81 mg daily and Plavix 75 mg daily x 21 days then Plavix alone 3. Pain Management: Neurontin 300 mg 3 times daily at home. Will decrease to 300 BID based on pharmacy recommendation.  Tylenol as needed Severe OA left knee, cont Voltaren , discussed rec for no elective surgery for ~617moost stroke   12/30-12/31-  pain tolerable- con't regimen 4. Mood/Behavior/Sleep: Cymbalta 30 mg daily provide emotional support             -antipsychotic agents: N/A 5. Neuropsych/cognition:  This patient is capable of making decisions on her own behalf. 6. Skin/Wound Care: Routine skin checks 7. Fluids/Electrolytes/Nutrition: Routine in and outs with follow-up chemistries 8.  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  Latest creatinine improved 1.28.  Follow-up chemistries 9.  Aspiration pneumonia finishing course of Augmentin 10.  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  Follow-up outpatient as needed.  Follow-up CT scan with IV contrast if possible in a few weeks.  If still present consider MRI. 11.  Hypothyroidism.  Synthroid 12.  Chronic diastolic congestive heart failure.  Monitor for any signs of fluid overload. Monitor weight -12/25 weight appears stable, no signs overload  12/30- weight reducing- but last weight 12/28- will speak with nursing about this. Is ordered  12/31- Weight basically stable- con't to monitor trend Filed Weights   12/03/22 0500 12/04/22 0423 12/07/22 0601  Weight: 50 kg 49.9 kg 50.6 kg    13.  Iron deficiency anemia.  Follow-up CBC.  Continue Niferex daily 14.  History of asthma.  Continue inhalers as directed as well as chronic prednisone 10 mg daily 15.  Hyperlipidemia.  Lipitor 40 mg 16.  Hypertension.  Will hold off on restarting Norvasc 10 mg daily, Inderal 60 mg daily. Consider restarting if needed.  Losartan stopped due to AKI.  Well-controlled overall.  Monitor with increased   -12/26 Restart inderal '60mg'$  12/27 will d/c inderal due to worsening orthostasis  12/30- Tachycardia worse with stopping inderal and BP rising some- in 269S-854O systolic.   27/03- BP better controlled today- monitor trend and con't regimen for now    12/07/2022    6:01 AM 12/07/2022    4:52 AM 12/06/2022    7:41 PM  Vitals with BMI  Weight 111 lbs 9 oz    BMI 50.09    Systolic  381 829  Diastolic  49 58  Pulse  75 82    17.  Abdominal pain and coffee-ground emesis.  Evaluated by GI no procedures recommended.  Continue pantoprazole and follow-up with GI  outpatient.  May need outpatient EGD. 18.  Hypomagnesemia.  Recheck level  -12/22 1.7 today, add 1 tab slow mg daily  12/25 MG 1.8 today stable 19.  Hypokalemia.  Recheck level 20. Constipation: resolved, stool now loose, discussed initial constipation could have been from iron 21. Fatigue  -Check orthostatic VS, Ted hose 22. Hypokalemia  -12/22 Replete with KCL, improved to 3.5 -12/25 recheck tomorrow 12/30- K+ 3.7 23. Loose stool: d/c miralax, resolved. D/c magnesium.  24. Hallucinations:resolved  UA/UC ordered.   -U/A with WBC, many bact , neg nitrite, UCx >100K Kleb P , start keflex    12/30- Klebsiella pneumonia- per sensitivities, is OK.   Occipital infarcts may be causing visual misperceptions     LOS: 10 days A FACE TO FACE EVALUATION WAS PERFORMED  Brigit Doke 12/07/2022, 11:14 AM

## 2022-12-07 NOTE — Consult Note (Signed)
Neuropsychological Consultation   Patient:   [name]   DOB:   June 27, 1941  MR Number:  [id number]  Location:  [place] [facility] [place] [id number] [place] [address] Dept: [place]: [phone]           Date of Service:   12/05/2022  Start Time:   9 AM End Time:   10 AM  Provider/Observer:  Ilean Skill, Psy.D.       Clinical Neuropsychologist       Billing Code/Service: [phone]  Reason for Service:    [name] is an 81 year old female referred for neuropsychological consultation due to ongoing concerns around depression and anxiety with a past history of depression and anxiety and is currently on the comprehensive inpatient rehabilitation unit after suffering extensive acute nonhemorrhagic infarct involving posterior inferior cerebellum bilaterally with right greater than left and 9 mm right occipital pole infarct.  Patient has a history of recent stroke with left-sided weakness and has been maintained on aspirin and Plavix.  She was previously on the inpatient rehabilitation services between 9/15 and 09/05/2022 prior to discharge home.  Patient recently presented to [facility] on 11/23/2022 with increasing left-sided weakness, nausea vomiting and nonspecific abdominal pain.  MRI brain showed extensive acute nonhemorrhagic infarct as noted above.  Patient was admitted to the rehab unit on 11/27/2022.   Below is the HPI for the current admission for convenience:  HPI: [name] is an 44 year old right-handed female with history of recent stroke with left-sided weakness maintained on aspirin and Plavix/loop recorder placement received inpatient rehab services 08/22/2022 - 09/05/2022 discharge to home ambulating 150 feet with a rollator, hypertension, hyperlipidemia, asthma, pancreatitis, chronic anemia, diastolic congestive heart failure.  Per chart review patient lives with  spouse.  Two-level home bed and bath main level 2 steps to entry.  Presented to [facility] 11/23/2022 with increasing left-sided weakness nausea vomiting nonspecific abdominal pain.  Cranial CT scan negative for acute changes.  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  New compared to 08/19/2022.  No other intracranial arterial occlusion or high-grade stenosis.  Patient did not receive tPA.  MRI of the brain showed extensive acute nonhemorrhagic infarct involving the posterior inferior cerebellum bilaterally right greater than left.  9 mm right occipital pole infarct.  Punctate white matter infarct in the left occipital lobe.  Additional punctate cortical infarct in the medial right occipital lobe more superiorly.  Echocardiogram with ejection fraction of 55 to 60% no wall motion abnormalities grade 1 diastolic dysfunction.  CT abdomen pelvis showed a 8.4 cm masslike inflammatory/edematous changes in the anterior pelvic fat cephalad to the urinary bladder, possibly contusion versus neoplasm.  Admission chemistries unremarkable except glucose 179 creatinine 1.79, urine drug screen negative, troponin negative, blood cultures no growth to date hemoglobin 10.5, WBC 11,600.  Neurology follow-up initially placed on IV heparin and currently remains on aspirin and Plavix for CVA prophylaxis x 21 days then Plavix alone.  Lovenox added for DVT prophylaxis.  Gastroenterology services consulted Dr. Virgina Jock in regards to coffee-ground emesis with CT abdomen pelvis reviewed not felt to be GI bleed consider CTA if worsening but no plan for endoscopy or further workup at this time.  Patient was cleared to continue aspirin and Plavix therapy.  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  AKI improved with gentle IV fluids latest creatinine 1.28.  Question aspiration pneumonia initially on Unasyn and transition  to Augmentin finishing course.  Cardiology interrogated loop recorder and no events noted. Tolerating a regular consistency diet.  Therapy evaluations completed due to patient's decreased functional mobility left-sided weakness was admitted for a comprehensive rehab program.   Current Status:  Patient was awake and oriented but still somewhat weak.  She was moving all limbs.  Patient reports that she continues to have some motor coordination symptoms consistent with most recent cerebrovascular event and continues with some left-sided weakness from previous stroke.  Patient acknowledged history of depression and anxiety and continues on her home medications Cymbalta and reports that this medication has been effective for her mood states.  Patient reports that she has had some frustration and anxiety with new vascular event and concerns about returning back to her home but is seeing functional gains throughout her efforts in rehab.  Behavioral Observation: [name]  presents as a 48 y.o.-year-old Right handed Caucasian Female who appeared her stated age. her dress was Appropriate and she was Well Groomed and her manners were Appropriate to the situation.  her participation was indicative of Appropriate behaviors.  There were physical disabilities noted.  she displayed an appropriate level of cooperation and motivation.    Interactions:    Active Appropriate  Attention:   within normal limits and attention span and concentration were age appropriate  Memory:   within normal limits; recent and remote memory intact  Visuo-spatial:  not examined  Speech (Volume):  low  Speech:   normal; normal  Thought Process:  Coherent and Relevant  Though Content:  WNL; not suicidal and not homicidal  Orientation:   person, place, time/date, and situation  Judgment:   Fair  Planning:   Fair  Affect:    Depressed  Mood:    Dysphoric  Insight:   Good  Intelligence:   normal  Medical History:   Past  Medical History:  Diagnosis Date   Anemia    Arthritis    Asthma    uses inhaler just prior to surgery to avoid attack   Back pain    from previous injury   Complication of anesthesia    has woken  up during 2 different surgery   Depression    no current issue/treatment; situation   Gallstones    GERD (gastroesophageal reflux disease)    Hiatal hernia    patient does NOT have nerve/muscle disease   History of kidney stones    HLD (hyperlipidemia)    HTN (hypertension)    Hypothyroidism    Kidney stones    Knee pain    Non-diabetic pancreatic hormone dysfunction years   pt. 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   HLD (hyperlipidemia) 11/23/2022   Asthma 11/23/2022   Depression with anxiety 11/23/2022  Chronic diastolic CHF (congestive heart failure) ([place]) 11/23/2022   Normocytic anemia 11/23/2022   Aspiration pneumonia ([place]) 11/23/2022   AKI (acute kidney injury) ([place]) 11/23/2022   Abdominal pain 11/23/2022   Mesenteric mass 11/23/2022   Coffee ground emesis 11/23/2022   Nausea and vomiting 10/15/2022   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   Fatigue 09/17/2022   Left hemiparesis ([place]) 09/17/2022   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[place]) 08/22/2022   GERD (gastroesophageal reflux disease) 08/21/2022   Agitation 08/20/2022   Acute left-sided weakness 08/20/2022   Expressive aphasia    Stroke ([place]) 08/19/2022   Leukocytosis 08/19/2022   History of urticaria 04/28/2022   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   Primary osteoarthritis of left knee 02/24/2022   Primary osteoarthritis of right knee 02/24/2022   Lumbar spondylolysis 04/12/2020   History of CVA (cerebrovascular accident) 03/26/2020   Low back pain radiating to right lower extremity 03/21/2020   B12 deficiency 03/06/2020   Positive anti-CCP test 12/21/2019   Arthralgia 12/13/2019   Dermatitis 12/13/2019   Rheumatoid factor positive 12/13/2019   Essential hypertension 12/11/2018   Palpitations 12/11/2018   Acquired hypothyroidism 11/10/2018   Arthritis of knee 09/17/2016   Anxiety 11/22/2014   Asthma without status asthmaticus 11/22/2014   Benign neoplasm of colon, unspecified 11/22/2014   Environmental allergies 11/22/2014   Hypertriglyceridemia 11/22/2014   Hypokalemia 11/22/2014   Personal history of disease of skin and subcutaneous tissue 11/22/2014   Psychiatric History:  Patient with past history of depression and effectively being treated psychotropic [place].  Family Med/Psych History:  Family History  Problem Relation Age of Onset   Anesthesia problems Father        "bad lungs" couldn't wake him up   Heart disease Father    Heart attack Father 10   Breast cancer Maternal Grandmother    Breast cancer Maternal Aunt        x 2   Breast cancer Cousin    Pancreatic cancer Cousin    Heart attack Paternal Uncle    Stroke Paternal Grandfather     Impression/DX:  [name] is an 81 year old female referred for neuropsychological consultation due to ongoing concerns around depression and anxiety with a past history of depression and anxiety and is currently on the comprehensive inpatient rehabilitation unit after suffering extensive acute nonhemorrhagic infarct involving posterior inferior cerebellum bilaterally with right greater than left and 9 mm right occipital pole infarct.  Patient has a history of recent stroke with left-sided weakness and has been maintained on aspirin and Plavix.  She  was previously on the inpatient rehabilitation services between 9/15 and 09/05/2022 prior to discharge home.  Patient recently presented to [facility] on 11/23/2022 with increasing left-sided weakness, nausea vomiting and nonspecific abdominal pain.  MRI brain showed extensive acute nonhemorrhagic infarct as noted above.  Patient was admitted to the rehab unit on 11/27/2022.  Patient was awake and oriented but still somewhat weak.  She was moving all limbs.  Patient reports that she continues to have some motor coordination symptoms consistent with most recent cerebrovascular event and continues with some left-sided weakness from previous stroke.  Patient acknowledged history of depression and anxiety and continues on her home medications Cymbalta and reports that this medication has been effective for her mood states.  Patient reports that she has had some frustration and anxiety with new vascular event and concerns about returning back to her home but is seeing functional gains throughout her efforts in rehab.  Disposition/Plan:  Today we worked on coping and adjustment issues and addressed some of her concerns regarding plan for return to home post discharge.  Diagnosis:    Acute left-sided weakness - Plan: Ambulatory referral to Neurology, Ambulatory referral to Physical Medicine Rehab         Electronically Signed   _______________________ Ilean Skill, Psy.D. Clinical Neuropsychologist

## 2022-12-07 NOTE — Discharge Summary (Signed)
Physician Discharge Summary  Patient ID: [name] MRN: [id number] DOB/AGE: [birth year] 80 y.o.  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 Active Problems:   Hypomagnesemia DVT prophylaxis Pain management Mood stabilization AKI versus CKD Aspiration pneumonia Mesenteric mass Hypothyroidism Chronic diastolic congestive heart failure Iron deficiency anemia History of asthma Hyperlipidemia Hypertension Constipation Klebsiella UTI  Discharged Condition: Stable  Significant Diagnostic Studies: ECHOCARDIOGRAM COMPLETE  Result Date: 11/24/2022    ECHOCARDIOGRAM REPORT   Patient Name:   [name] Date of Exam: 11/24/2022 Medical Rec #:  [id number]  Height:       59.0 in Accession #:    [id number] Weight:       106.7 lb Date of Birth:  1941-01-22 BSA:          1.412 m Patient Age:    81 years   BP:           122/45 mmHg Patient Gender: F          HR:           87 bpm. Exam Location:  [facility] Procedure: 2D Echo, Color Doppler and Cardiac Doppler Indications:     I63.9 Stroke  History:         Patient has prior history of Echocardiogram examinations, most                  recent 08/20/2022. Risk Factors:Hypertension and Dyslipidemia.  Sonographer:     Charmayne Sheer Referring Phys:  [id number] Lorenza Chick Diagnosing Phys: Kathlyn Sacramento MD  Sonographer Comments: No subcostal window. IMPRESSIONS  1. Left ventricular ejection fraction, by estimation, is 55 to 60%. The left ventricle has normal function. The left ventricle has no regional wall motion abnormalities. Left ventricular diastolic parameters are consistent with Grade I diastolic dysfunction (impaired relaxation).  2. Right ventricular systolic function is normal. The right ventricular size is normal. Tricuspid regurgitation signal is inadequate for assessing PA pressure.  3. The mitral valve is normal in structure. No evidence of mitral  valve regurgitation. No evidence of mitral stenosis.  4. The aortic valve is normal in structure. Aortic valve regurgitation is not visualized. Aortic valve sclerosis is present, with no evidence of aortic valve stenosis.  5. The inferior vena cava is normal in size with greater than 50% respiratory variability, suggesting right atrial pressure of 3 mmHg. FINDINGS  Left Ventricle: Left ventricular ejection fraction, by estimation, is 55 to 60%. The left ventricle has normal function. The left ventricle has no regional wall motion abnormalities. The left ventricular internal cavity size was normal in size. There is  no left ventricular hypertrophy. Left ventricular diastolic parameters are consistent with Grade I diastolic dysfunction (impaired relaxation). Right Ventricle: The right ventricular size is normal. No increase in right ventricular wall thickness. Right ventricular systolic function is normal. Tricuspid regurgitation signal is inadequate for assessing PA pressure. Left Atrium: Left atrial size was normal in size. Right Atrium: Right atrial size was normal in size. Pericardium: There is no evidence of pericardial effusion. Mitral Valve: The mitral valve is normal in structure. No evidence of mitral valve regurgitation. No evidence of mitral valve stenosis. Tricuspid Valve: The tricuspid valve is normal in structure. Tricuspid valve regurgitation is not demonstrated. No evidence of tricuspid stenosis. Aortic Valve: The aortic valve is normal in structure. Aortic valve regurgitation is not visualized. Aortic valve sclerosis is present, with no evidence of aortic valve  stenosis. Aortic valve mean gradient measures 6.0 mmHg. Aortic valve peak gradient measures 11.4 mmHg. Aortic valve area, by VTI measures 1.71 cm. Pulmonic Valve: The pulmonic valve was normal in structure. Pulmonic valve regurgitation is trivial. No evidence of pulmonic stenosis. Aorta: The aortic root is normal in size and structure. Venous:  The inferior vena cava is normal in size with greater than 50% respiratory variability, suggesting right atrial pressure of 3 mmHg. IAS/Shunts: No atrial level shunt detected by color flow Doppler.  LEFT VENTRICLE PLAX 2D LVIDd:         3.60 cm   Diastology LVIDs:         2.50 cm   LV e' medial:    8.05 cm/s LV PW:         1.20 cm   LV E/e' medial:  13.5 LV IVS:        0.90 cm   LV e' lateral:   11.70 cm/s LVOT diam:     1.80 cm   LV E/e' lateral: 9.3 LV SV:         57 LV SV Index:   40 LVOT Area:     2.54 cm  RIGHT VENTRICLE RV Basal diam:  2.70 cm RV Mid diam:    3.60 cm RV S prime:     25.00 cm/s TAPSE (M-mode): 3.9 cm LEFT ATRIUM             Index        RIGHT ATRIUM           Index LA diam:        3.70 cm 2.62 cm/m   RA Area:     11.70 cm LA Vol (A2C):   23.4 ml 16.57 ml/m  RA Volume:   25.30 ml  17.92 ml/m LA Vol (A4C):   39.8 ml 28.18 ml/m LA Biplane Vol: 31.9 ml 22.59 ml/m  AORTIC VALVE                     PULMONIC VALVE AV Area (Vmax):    1.75 cm      PV Vmax:       1.27 m/s AV Area (Vmean):   1.68 cm      PV Vmean:      88.900 cm/s AV Area (VTI):     1.71 cm      PV VTI:        0.286 m AV Vmax:           169.00 cm/s   PV Peak grad:  6.5 mmHg AV Vmean:          115.000 cm/s  PV Mean grad:  4.0 mmHg AV VTI:            0.333 m AV Peak Grad:      11.4 mmHg AV Mean Grad:      6.0 mmHg LVOT Vmax:         116.00 cm/s LVOT Vmean:        75.700 cm/s LVOT VTI:          0.224 m LVOT/AV VTI ratio: 0.67  AORTA Ao Root diam: 2.70 cm MITRAL VALVE MV Area (PHT): 4.49 cm     SHUNTS MV Decel Time: 169 msec     Systemic VTI:  0.22 m MV E velocity: 109.00 cm/s  Systemic Diam: 1.80 cm MV A velocity: 130.00 cm/s MV E/A ratio:  0.84 Kathlyn Sacramento MD Electronically signed by Kathlyn Sacramento MD Signature  Date/Time: 11/24/2022/1:17:18 PM    Final    MR CERVICAL SPINE WO CONTRAST  Result Date: 11/23/2022 CLINICAL DATA:  Ataxia. Vertebral artery occlusion. Right MCA territory infarct 3 months ago. EXAM: MRI CERVICAL  SPINE WITHOUT CONTRAST TECHNIQUE: Multiplanar, multisequence MR imaging of the cervical spine was performed. No intravenous contrast was administered. COMPARISON:  MRI cervical spine 11/01/2003 FINDINGS: Alignment: 3 mm anterolisthesis is present at C3-4 and C4-5. No other significant listhesis is present. Straightening of the normal cervical lordosis is present. Vertebrae: Mild fatty endplate marrow changes are most evident at C5-6 and C6-7. Marrow signal and vertebral body heights are otherwise normal. Cord: Linear T2 hyperintensity is present along the right side of the cord at C4-5, likely related to chronic myelomalacia with adjacent disc disease. No other cord signal abnormalities are present. Cord morphology is otherwise within normal limits. Posterior Fossa, vertebral arteries, paraspinal tissues: Prominent soft tissue pannus is present at C1-2. This effaces the ventral CSF. Craniocervical junction is otherwise within normal limits. Flow voids are present within the vertebral arteries bilaterally both within the posterior fossa and along the spinal canal. Disc levels: C2-3: No significant stenosis. C3-4: A rightward disc osteophyte complex effaces the ventral CSF. Severe left and moderate right foraminal stenosis is present. C4-5: A broad-based disc osteophyte complex effaces the ventral CSF. The canal is narrowed 7 mm. Severe left and moderate right foraminal stenosis is present. C5-6: Chronic loss of disc height is present. Broad-based disc osteophyte complex is asymmetric to the right. Partial effacement of the ventral CSF is present. Moderate foraminal stenosis is worse right than left. C6-7: A broad-based disc osteophyte complex is present. This partially effaces the ventral CSF. Moderate foraminal narrowing is worse right than left. C7-T1: Uncovertebral spurring is present bilaterally. Mild bilateral foraminal stenosis is present. IMPRESSION: 1. Multilevel spondylosis of the cervical spine as  described. 2. 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 3. 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 4. No other significant cord signal abnormality to suggest ischemic changes to the cord. 5. Severe left and moderate right foraminal stenosis at C3-4 and C4-5. 6. Moderate foraminal stenosis bilaterally at C5-6 and C6-7 is worse on the right. 7. Mild bilateral foraminal narrowing at C7-T1. 8. Prominent soft tissue pannus at C1-2 effaces the ventral CSF. 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 Electronically Signed   By: San Morelle M.D.   On: 11/23/2022 20:11   MR BRAIN WO CONTRAST  Result Date: 11/23/2022 CLINICAL DATA:  Recent right MCA infarct with left-sided defects 3 months ago. Patient had a syncopal episode today and was assist to the ground. Upon waking she states that she is weaker on the left. She reports headaches, nausea and vomiting. EXAM: MRI HEAD WITHOUT CONTRAST TECHNIQUE: Multiplanar, multiecho pulse sequences of the brain and surrounding structures were obtained without intravenous contrast. COMPARISON:  CT and CTA head and neck 11/23/2022. FINDINGS: Brain: Diffusion-weighted images demonstrate extensive acute nonhemorrhagic infarcts involving the posteroinferior cerebellum bilaterally, right greater than left. 9 mm right occipital pole infarct is present. A punctate white matter infarct is present in the left occipital lobe. Additional punctate cortical infarct is present in the medial right occipital lobe more superiorly a on image 32 of series 5. Expected evolution is present in the previous right posterior frontal lobe white matter infarct. No hemorrhage is present. T2 and FLAIR hyperintensities are associated with the multiple areas of acute  infarction. The patient's baseline periventricular and subcortical white matter changes are stable. Remote lacunar infarcts are again noted  within the thalami bilaterally. Brainstem is unremarkable. The ventricles are of normal size. No significant extraaxial fluid collection is present. Vascular: Flow is present in the major intracranial arteries. Skull and upper cervical spine: Extensive degenerative change in soft tissue pannus is again noted posterior to the dens. Craniocervical junction is otherwise within normal limits. Sinuses/Orbits: The paranasal sinuses and mastoid air cells are clear. Bilateral lens replacements are noted. Globes and orbits are otherwise unremarkable. IMPRESSION: 1. 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 2. 9 mm right occipital pole infarct. 3. Punctate white matter infarct in the left occipital lobe. 4. Additional punctate cortical infarct in the medial right occipital lobe more superiorly. 5. Expected evolution of previous right posterior frontal lobe white matter infarct. 6. Stable atrophy and white matter disease likely reflects the sequela of chronic microvascular ischemia. Electronically Signed   By: San Morelle M.D.   On: 11/23/2022 20:03   CT ABDOMEN PELVIS WO CONTRAST  Result Date: 11/23/2022 CLINICAL DATA:  Abdominal pain, acute, nonlocalized EXAM: CT ABDOMEN AND PELVIS WITHOUT CONTRAST TECHNIQUE: Multidetector CT imaging of the abdomen and pelvis was performed following the standard protocol without IV contrast. RADIATION DOSE REDUCTION: This exam was performed according to the departmental dose-optimization program which includes automated exposure control, adjustment of the mA and/or kV according to patient size and/or use of iterative reconstruction technique. COMPARISON:  07/30/2004 by report only FINDINGS: Lower chest: No pleural or pericardial effusion. Small hiatal hernia. Visualized lung bases clear. Hepatobiliary: Hyperdense gallbladder contents probably vicarious excretion of previously administered contrast  material. No focal liver lesion or biliary ductal dilatation. Pancreas: Unremarkable. No pancreatic ductal dilatation or surrounding inflammatory changes. Spleen: Normal in size without focal abnormality. Adrenals/Urinary Tract: No adrenal mass. Mildly striated appearance of kidneys suggesting renal dysfunction. No hydronephrosis. Residual contrast material in the distended urinary bladder. Stomach/Bowel: Small hiatal hernia involving gastric fundus. Small bowel decompressed. Appendix not identified. The colon is incompletely distended by gas and fecal material with scattered diverticula; no adjacent inflammatory/edematous change. Vascular/Lymphatic: Scattered aortoiliac calcified atheromatous plaque without aneurysm. No abdominal or pelvic adenopathy. Reproductive: Post hysterectomy. 4.8 cm simple appearing left lower quadrant cyst abutting distal sigmoid colon, described on previous report of 07/30/2004 measured 4 cm, presumably benign adnexal or duplication cyst given significant stability over time. Other: No ascites.  No free air. Musculoskeletal: Poorly marginated streaky inflammatory/edematous changes in 8.4 cm mass-like anterior pelvic fat cephalad to the urinary bladder. Mild lumbar dextroscoliosis apex L2 with multilevel spondylitic change. Bilateral hip DJD. IMPRESSION: 1. 8.4 cm mass-like inflammatory/edematous changes in the anterior pelvic fat cephalad to the urinary bladder, possibly contusion if history of regional trauma; neoplasm eg liposarcoma would be a less common etiology. 2. Colonic diverticulosis. 3. Small hiatal hernia. 4.  Aortic Atherosclerosis (ICD10-I70.0). Electronically Signed   By: Lucrezia Europe M.D.   On: 11/23/2022 10:20   CT ANGIO HEAD NECK W WO CM W PERF (CODE STROKE)  Addendum Date: 11/23/2022   ADDENDUM REPORT: 11/23/2022 03:55 ADDENDUM: These results were called by telephone at the time of interpretation on 11/23/2022 at 3:54 am to provider Marry Guan , who verbally  acknowledged these results. Electronically Signed   By: Ulyses Jarred M.D.   On: 11/23/2022 03:55   Result Date: 11/23/2022 CLINICAL DATA:  Syncope EXAM: CT ANGIOGRAPHY HEAD AND NECK TECHNIQUE: Multidetector CT imaging of the  head and neck was performed using the standard protocol during bolus administration of intravenous contrast. Multiplanar CT image reconstructions and MIPs were obtained to evaluate the vascular anatomy. Carotid stenosis measurements (when applicable) are obtained utilizing NASCET criteria, using the distal internal carotid diameter as the denominator. RADIATION DOSE REDUCTION: This exam was performed according to the departmental dose-optimization program which includes automated exposure control, adjustment of the mA and/or kV according to patient size and/or use of iterative reconstruction technique. 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 No acute fracture. OTHER NECK: Normal pharynx, larynx and major salivary glands. No cervical lymphadenopathy. Unremarkable thyroid gland. UPPER CHEST: No pneumothorax or pleural effusion. No nodules or masses. AORTIC ARCH: There is no calcific atherosclerosis of the aortic arch. There is no aneurysm, dissection or hemodynamically significant stenosis of the visualized portion of the aorta. Conventional 3 vessel aortic branching pattern. The visualized proximal subclavian arteries are widely patent. RIGHT CAROTID SYSTEM: Normal without aneurysm, dissection or stenosis. LEFT CAROTID SYSTEM: Normal without aneurysm, dissection or stenosis. VERTEBRAL ARTERIES: Left dominant configuration. Both origins are clearly patent. There is enhancement of the right V2 segment which becomes occluded distally and remains occluded to the vertebrobasilar junction. CTA HEAD FINDINGS POSTERIOR CIRCULATION: --Vertebral arteries: Right V4  segment is occluded.  Left is normal. --Inferior cerebellar arteries: Normal. --Basilar artery: Normal. --Superior cerebellar arteries: Normal. --Posterior cerebral arteries (PCA): Normal. ANTERIOR CIRCULATION: --Intracranial internal carotid arteries: Normal. --Anterior cerebral arteries (ACA): Normal. Both A1 segments are present. Patent anterior communicating artery (a-comm). --Middle cerebral arteries (MCA): Normal. VENOUS SINUSES: As permitted by contrast timing, patent. ANATOMIC VARIANTS: None Review of the MIP images confirms the above findings. IMPRESSION: 1. 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 2. No other intracranial arterial occlusion or high-grade stenosis. 3. 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 Electronically Signed: By: KUlyses JarredM.D. On: 11/23/2022 03:31   DG Chest Port 1 View  Result Date: 11/23/2022 CLINICAL DATA:  Weakness, syncope EXAM: PORTABLE CHEST 1 VIEW COMPARISON:  05/15/2022 FINDINGS: Mild patchy/interstitial opacities, right upper lobe predominant. No pleural effusion or pneumothorax. The heart is normal in size. IMPRESSION: Mild patchy/interstitial opacities, right upper lobe predominant, suspicious for mild pneumonia. Electronically Signed   By: SJulian HyM.D.   On: 11/23/2022 03:23   CT HEAD CODE STROKE WO CONTRAST  Result Date: 11/23/2022 CLINICAL DATA:  Code stroke. EXAM: CT HEAD WITHOUT CONTRAST TECHNIQUE: Contiguous axial images were obtained from the base of the skull through the vertex without intravenous contrast. RADIATION DOSE REDUCTION: This exam was performed according to the departmental dose-optimization program which includes automated exposure control, adjustment of the mA and/or kV according to patient size and/or use of iterative reconstruction technique. COMPARISON:  None Available. FINDINGS: Brain: There is no mass, hemorrhage or extra-axial collection.  The size and configuration of the ventricles and extra-axial CSF spaces are normal. 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 Vascular: No abnormal hyperdensity of the major intracranial arteries or dural venous sinuses. No intracranial atherosclerosis. Skull: The visualized skull base, calvarium and extracranial soft tissues are normal. Sinuses/Orbits: No fluid levels or advanced mucosal thickening of the visualized paranasal sinuses. No mastoid or middle ear effusion. The orbits are normal. ASPECTS [facility]) - Ganglionic level infarction (caudate, lentiform nuclei, internal capsule, insula, M1-M3 cortex): 7 - Supraganglionic  infarction (M4-M6 cortex): 3 Total score (0-10 with 10 being normal): 10 IMPRESSION: 1. No acute intracranial abnormality. 2. 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 3. ASPECTS is 10. These results were called by telephone at the time of interpretation on 11/23/2022 at 2:04 am to provider JADE SUNG , who verbally acknowledged these results. Electronically Signed   By: Ulyses Jarred M.D.   On: 11/23/2022 02:05    Labs:  Basic Metabolic Panel: Recent Labs  Lab 12/04/22 0557  CREATININE 0.97    CBC: No results for input(s): "WBC", "NEUTROABS", "HGB", "HCT", "MCV", "PLT" in the last 168 hours.  CBG: No results for input(s): "GLUCAP" in the last 168 hours.  Family history.  Father with CAD myocardial infarction.  Maternal aunt with breast cancer.  Denies any colon cancer esophageal cancer or rectal cancer  Brief HPI:   [name] is a 11 y.o. right-handed female with history of recent stroke with left-sided weakness maintained on aspirin and Plavix with loop recorder placement received inpatient rehab services 08/22/2022 - 09/05/2022 discharge to home ambulating supervision level, hypertension hyperlipidemia, pancreatitis, chronic anemia, diastolic congestive heart failure.  Per chart review lives with spouse.  Presented to [facility]  11/23/2022 with increasing left-sided weakness nausea vomiting nonspecific abdominal pain.  Cranial CT scan negative for acute changes.  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  New compared to 08/19/2022.  No other intracranial arterial occlusion or high-grade stenosis.  Patient did not receive tPA.  MRI of the brain showed extensive acute nonhemorrhagic infarct involving the posterior inferior cerebellum bilaterally right greater than left.  A 9 mm right occipital pole infarction.  Punctate white matter infarct in the left occipital lobe.  Additional punctate cortical infarct in the medial right occipital lobe more superiorly.  Echocardiogram with ejection fraction of 55 to 60% no wall motion abnormalities grade 1 diastolic dysfunction.  CT abdomen pelvis showed 8.4 cm masslike inflammatory edematous changes in the anterior pelvic fat cephalad to the urinary bladder possibly contusion versus neoplasm.  Admission chemistries unremarkable except glucose 179 creatinine 1.79 urine drug screen negative.  Neurology follow-up initially placed on intravenous heparin currently remained on aspirin and Plavix 21 days then Plavix alone for CVA prophylaxis.  Lovenox added for DVT prophylaxis.  Gastroenterology services Dr. Virgina Jock in regards to coffee-ground emesis with CT abdomen pelvis reviewed not felt to be GI bleed consider CTA if worsening but no plan for endoscopy or further workup at this time.  Patient was cleared to continue aspirin and Plavix therapy.  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  AKI improved with gentle IV fluids latest creatinine 1.28.  Question aspiration pneumonia initially on Unasyn transition to Augmentin finishing course.  Cardiology interrogated loop recorder no events noted.  Therapy evaluations completed due to patient decreased functional ability  left-sided weakness was admitted for a comprehensive rehab program.   Hospital Course: [name] was admitted to rehab 11/27/2022 for inpatient therapies to consist of PT, ST and OT at least three hours five days a week. Past admission physiatrist, therapy team and rehab RN have worked together to provide customized collaborative inpatient rehab.  Pertaining to patient's extensive nonhemorrhagic infarct posterior inferior cerebellum bilateral right greater than left 9 mm right occipital lobe infarction.  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  Patient remained on aspirin and Plavix therapy x 21 days then Plavix alone.  Lovenox for DVT prophylaxis.  Pain managed with use of Neurontin titrate as needed.  Voltaren gel was added.  Mood stabilization with Cymbalta and emotional support provided.  AKI versus CKD latest creatinine improved 1.28.  Hospital course aspiration pneumonia finishing course of Augmentin with oxygen saturations maintained.  Question mesenteric mass cleared by general surgery felt most likely contusion follow-up CT scan outpatient if needed.  Synthroid ongoing for hypothyroidism.  Chronic diastolic congestive heart failure exhibited no signs of fluid overload.  Iron deficiency anemia maintained on iron supplement no bleeding episodes.  History of asthma with prednisone chronically oxygen saturations maintained.  Lipitor for hyperlipidemia.  Blood pressure remains soft losartan stopped due to AKI.  Klebsiella UTI sensitive to Keflex completing course no dysuria or hematuria.   Blood pressures were monitored on TID basis and soft and monitored      Rehab course: During patient's stay in rehab weekly team conferences were held to monitor patient's progress, set goals and discuss barriers to discharge. At admission, patient required min mod assist 30 feet rolling  walker minimal assist stand pivot transfers  Physical exam.  Blood pressure 137/76 pulse 100 temperature 97.9 respirations 19 oxygen saturation is 97% room air Constitutional.  No acute distress HEENT Head.  Normocephalic and atraumatic Eyes.  Pupils round and reactive to light no discharge without nystagmus Neck.  Supple nontender no JVD without thyromegaly Cardiac regular rate and rhythm without any extra sounds or murmur heard Abdomen.  Soft nontender positive bowel sounds without rebound Respiratory effort normal no respiratory distress without wheeze Skin.  Warm and dry Neurologic.  Alert oriented follows commands.  Hard of hearing.  Sensation intact all 4. Strength 5 out of 5 right lower extremity right upper extremity Strength 4+ out of 5 left lower extremity Strength 3 out of 5 proximal left upper extremity to 4 -/5 distal right upper extremity  He/She  has had improvement in activity tolerance, balance, postural control as well as ability to compensate for deficits. He/She has had improvement in functional use RUE/LUE  and RLE/LLE as well as improvement in awareness.  Patient making steady progress demonstrating increased independence with functional mobility.  Perform supine to sit with supervision sit to stand and stand pivot transfer with rolling walker contact-guard.  Ambulates 150 feet rolling walker contact-guard.  Navigates 8 stairs bilateral handrails and contact-guard.  Patient overall set up to supervision for seated ADLs requiring contact-guard rolling walker for standing ADLs.  Full family teaching completed plan discharge to home       Disposition: Discharge to home    Diet: Regular  Special Instructions: No driving smoking or alcohol  Medications at discharge 1.  Tylenol as needed 2.  Aspirin 81 mg p.o. daily x 5 days and stop 3.  Lipitor 40 mg p.o. daily 4.  Plavix 75 mg p.o. daily  5.  Voltaren gel 2 g 4 times daily to affected area 6.  Cymbalta 30 mg  p.o. daily 7.  Folic acid 1 mg p.o. daily 8.  Neurontin 300 mg p.o. twice daily 9.  Niferex 150 mg p.o. daily 10.  Synthroid 50 mcg p.o. daily 11.  Singulair 10 mg p.o. daily 12.  Multivitamin daily 13.  Protonix 40 mg p.o. twice daily 14.  Prednisone 10 mg p.o. daily 15.  Inderal 60 mg daily 30-35 minutes were  spent completing discharge summary and discharge planning  Discharge Instructions     Ambulatory referral to Neurology   Complete by: As directed    An appointment is requested in approximately: 4 weeks   Ambulatory referral to Physical Medicine Rehab   Complete by: As directed    Moderate complexity follow-up 1 to 2 weeks right occipital infarction        Follow-up Information     Kirsteins, Luanna Salk, MD Follow up.   Specialty: Physical Medicine and Rehabilitation Why: Office to call for appointment Contact information: [place] [address] [phone]                 Signed: Cathlyn Parsons 12/10/2022, 5:05 AM

## 2022-12-08 DIAGNOSIS — I639 Cerebral infarction, unspecified: Secondary | ICD-10-CM | POA: Diagnosis not present

## 2022-12-08 MED ORDER — AMLODIPINE BESYLATE 2.5 MG PO TABS
2.5000 mg | ORAL_TABLET | Freq: Every day | ORAL | Status: DC
  Administered 2022-12-08 – 2022-12-10 (×3): 2.5 mg via ORAL
  Filled 2022-12-08 (×3): qty 1

## 2022-12-08 NOTE — Progress Notes (Signed)
PROGRESS NOTE   Subjective/Complaints:  No issues overnite Participating in OT this am    ROS-  Pt denies SOB, abd pain, CP, N/V/C/D, and vision changes    Objective:   No results found. No results for input(s): "WBC", "HGB", "HCT", "PLT" in the last 72 hours.  No results for input(s): "NA", "K", "CL", "CO2", "GLUCOSE", "BUN", "CREATININE", "CALCIUM" in the last 72 hours.   Intake/Output Summary (Last 24 hours) at 12/08/2022 1101 Last data filed at 12/08/2022 0739 Gross per 24 hour  Intake 810 ml  Output --  Net 810 ml         Physical Exam: Vital Signs Blood pressure (!) 199/73, pulse 69, temperature 97.6 F (36.4 C), resp. rate 17, height '4\' 11"'$  (1.499 m), weight 50.1 kg, SpO2 97 %.   General: No acute distress Mood and affect are appropriate Heart: Regular rate and rhythm no rubs murmurs or extra sounds Lungs: Clear to auscultation, breathing unlabored, no rales or wheezes Abdomen: Positive bowel sounds, soft nontender to palpation, nondistended Extremities: No clubbing, cyanosis, or edema   Skin: warm and dry Neuro: Alert and oriented to place, name, month and year and situation.  Follows commands.  Left facial droop.  EOMI.  PERRL Sensation intact light touch in all 4 extremities Strength 5 out of 5 in right lower extremities and right upper extremity Strength 4+ out of 5 left lower extremity Strength 3 out of 5 proximal left upper extremity  Musculoskeletal: No joint swelling or tenderness noted, no abnormal range of motion noted No evidence of Dupuytren's contracture in Left hand , extensory lag left index finger, tone related   Assessment/Plan: 1. Functional deficits which require 3+ hours per day of interdisciplinary therapy in a comprehensive inpatient rehab setting. Physiatrist is providing close team supervision and 24 hour management of active medical problems listed below. Physiatrist and  rehab team continue to assess barriers to discharge/monitor patient progress toward functional and medical goals  Care Tool:  Bathing    Body parts bathed by patient: Right arm, Left arm, Chest, Abdomen, Face, Right upper leg, Left upper leg, Front perineal area, Buttocks, Right lower leg, Left lower leg         Bathing assist Assist Level: Minimal Assistance - Patient > 75% (For standing balance.)     Upper Body Dressing/Undressing Upper body dressing   What is the patient wearing?: Pull over shirt    Upper body assist Assist Level: Set up assist    Lower Body Dressing/Undressing Lower body dressing      What is the patient wearing?: Underwear/pull up, Pants     Lower body assist Assist for lower body dressing: Minimal Assistance - Patient > 75% (For standing balance.)     Toileting Toileting    Toileting assist Assist for toileting: Minimal Assistance - Patient > 75%     Transfers Chair/bed transfer  Transfers assist     Chair/bed transfer assist level: Moderate Assistance - Patient 50 - 74%     Locomotion Ambulation   Ambulation assist      Assist level: Maximal Assistance - Patient 25 - 49% Assistive device: Hand held assist Max distance: 63f  Walk 10 feet activity   Assist     Assist level: Maximal Assistance - Patient 25 - 49% Assistive device: Hand held assist   Walk 50 feet activity   Assist    Assist level: Maximal Assistance - Patient 25 - 49% Assistive device: Hand held assist    Walk 150 feet activity   Assist Walk 150 feet activity did not occur: Safety/medical concerns         Walk 10 feet on uneven surface  activity   Assist Walk 10 feet on uneven surfaces activity did not occur: Safety/medical concerns         Wheelchair     Assist Is the patient using a wheelchair?: Yes (for transport) Type of Wheelchair: Manual    Wheelchair assist level: Dependent - Patient 0%      Wheelchair 50 feet with 2  turns activity    Assist        Assist Level: Dependent - Patient 0%   Wheelchair 150 feet activity     Assist      Assist Level: Dependent - Patient 0%   Blood pressure (!) 199/73, pulse 69, temperature 97.6 F (36.4 C), resp. rate 17, height '4\' 11"'$  (1.499 m), weight 50.1 kg, SpO2 97 %.  Medical Problem List and Plan: 1. 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  9 mm right occipital pole infarct.  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             -patient may  shower             -ELOS/Goals: 12/10/22          Con't CIR- PT, OT and SLP- discussed that it's normal to be anxious going home- but I agree to work on not overdoing- slow steady pace.   Per OT, gets unsteady after showers for "years". They saw signs of this  2.  Antithrombotics: -DVT/anticoagulation:  Pharmaceutical: Lovenox             -antiplatelet therapy: Aspirin 81 mg daily and Plavix 75 mg daily x 21 days then Plavix alone 3. Pain Management: Neurontin 300 mg 3 times daily at home. Will decrease to 300 BID based on pharmacy recommendation.  Tylenol as needed Severe OA left knee, cont Voltaren , discussed rec for no elective surgery for ~4mopost stroke   12/30-12/31-  pain tolerable- con't regimen 4. Mood/Behavior/Sleep: Cymbalta 30 mg daily provide emotional support             -antipsychotic agents: N/A 5. Neuropsych/cognition: This patient is capable of making decisions on her own behalf. 6. Skin/Wound Care: Routine skin checks 7. Fluids/Electrolytes/Nutrition: Routine in and outs with follow-up chemistries 8.  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  Latest creatinine improved 1.28.  Follow-up chemistries 9.  Aspiration pneumonia finishing course of Augmentin 10.  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  Follow-up outpatient as needed.  Follow-up CT scan with IV contrast if possible in a few weeks.  If still present consider MRI. 11.  Hypothyroidism.  Synthroid 12.  Chronic diastolic congestive heart failure.  Monitor for any signs of fluid overload. Monitor weight -12/25 weight appears stable, no signs overload  12/30- weight reducing- but last weight 12/28- will speak with nursing about this. Is ordered  12/31- Weight  basically stable- con't to monitor trend Filed Weights   12/04/22 0423 12/07/22 0601 12/08/22 0500  Weight: 49.9 kg 50.6 kg 50.1 kg    13.  Iron deficiency anemia.  Follow-up CBC.  Continue Niferex daily 14.  History of asthma.  Continue inhalers as directed as well as chronic prednisone 10 mg daily 15.  Hyperlipidemia.  Lipitor 40 mg 16.  Hypertension. BP more consistently elevated restart low dose amlodipineoverall.  Monitor with increased   -12/26 Restart inderal '60mg'$  12/27 will d/c inderal due to worsening orthostasis  12/30- Tachycardia worse with stopping inderal and BP rising some- [id number] systolic.       12/08/2022    5:00 AM 12/08/2022    4:27 AM 12/08/2022    4:20 AM  Vitals with BMI  Weight 110 lbs 7 oz    BMI 01.0    Systolic  071 219  Diastolic  73 93  Pulse  69 97   Restart amlodipine 17.  Abdominal pain and coffee-ground emesis.  Evaluated by GI no procedures recommended.  Continue pantoprazole and follow-up with GI outpatient.  May need outpatient EGD. 18.  Hypomagnesemia.  Recheck level  -12/22 1.7 today, add 1 tab slow mg daily  12/25 MG 1.8 today stable 19.  Hypokalemia.  Recheck level 20. Constipation: resolved, stool now loose, discussed initial constipation could have been from iron 21. Fatigue  -Check orthostatic VS, Ted hose 22. Hypokalemia  -12/22 Replete with KCL, improved to 3.5 -12/25 recheck tomorrow 12/30- K+ 3.7 23. Loose stool: d/c miralax, resolved. D/c magnesium.  24.  Hallucinations:resolved  UA/UC ordered.   -U/A with WBC, many bact , neg nitrite, UCx >100K Kleb P , start keflex    12/30- Klebsiella pneumonia- per sensitivities, is OK.   Occipital infarcts may be causing visual misperceptions     LOS: 11 days A FACE TO FACE EVALUATION WAS PERFORMED  Charlett Blake 12/08/2022, 11:01 AM

## 2022-12-08 NOTE — Progress Notes (Signed)
Occupational Therapy Session Note  Patient Details  Name: [name] MRN: [id number] Date of Birth: 25-Aug-1941  Today's Date: 12/08/2022 OT Individual Time: 1400-1430 OT Individual Time Calculation (min): 30 min    Short Term Goals: Week 1:  OT Short Term Goal 1 (Week 1): Pt will perform 3/3 toileting activities with CGA + LRAD. OT Short Term Goal 1 - Progress (Week 1): Met OT Short Term Goal 2 (Week 1): Pt will perform bathing activities with supervision + Min safety cues. OT Short Term Goal 2 - Progress (Week 1): Met OT Short Term Goal 3 (Week 1): Pt will perform full body dressing with supervision + Min safety cues. OT Short Term Goal 3 - Progress (Week 1): Met  Skilled Therapeutic Interventions/Progress Updates:     Pt received sitting in wc with husband present in room in good spirits and receptive to skilled OT session with a focus on LUE NMR and education on positioning. Pt reporting 0/10 pain.  Pt noted to have resting hand splint in room to night time wear, but Pt and husband reporting Pt has not been wearing splint d/t fear of discomfort in splint. Pt reported she had one at home and it was not helpful. Pt and husband educated on purpose and use of splint. Splint donned on Pt total A with Pt reporting comfort wearing splint and confidence in donning with assist from her husband. Pt verbalized intent to begin using splint.  Pt transported total A to therapy gym for time management. Pt educated on simple activities to complete to increase functional use of the LUE. Pt instructed to pick up small coins and store them in her hand to practice finger to palm translation. Pt then instructed to place coins in bank one at a time. Pt unable to store more than one coin in hand at a time with task graded down to picking up single coin and placing it in bank. Pt able to complete task with 6/6 large coins when given increased time mod verbal cues for technique and motivation. Pt then practiced  pincer grasp when reaching for coins presented at eye level in Pt L visual field and placing in bank completing task with min hand-under-hand assist. Pt transported back to room and left resting in wc with call bell in reach, husband present in room, and all needs met.   Therapy Documentation Precautions:  Precautions Precautions: Fall Precaution Comments: Aspiration Restrictions Weight Bearing Restrictions: No General:   Vital Signs: Therapy Vitals Temp: 98.6 F (37 C) Pulse Rate: 99 Resp: 16 BP: (Abnormal) 141/67 Patient Position (if appropriate): Sitting Oxygen Therapy SpO2: 99 % O2 Device: Room Air Pain: Pain Assessment Pain Scale: 0-10 Pain Score: 0-No pain ADL: ADL Eating: Set up Where Assessed-Eating: Wheelchair Upper Body Bathing: Setup Where Assessed-Upper Body Bathing: Shower Lower Body Bathing: Moderate cueing, Contact guard (For safety, to avoid hitting her head on shower head or grab bar.) Where Assessed-Lower Body Bathing: Shower Upper Body Dressing: Minimal assistance (Due to IV located on R-bicep area and impulsivity.) Where Assessed-Upper Body Dressing: Other (Comment) (Seated on TTB) Lower Body Dressing: Minimal assistance, Moderate cueing (For safety with standing balance.) Where Assessed-Lower Body Dressing: Other (Comment) (Seated on TTB) Toileting: Minimal assistance (For safety with dynamic standing balance.) Where Assessed-Toileting: Glass blower/designer: Therapist, music Method: Counselling psychologist: Geophysical data processor: Curator Method: Heritage manager: Radio broadcast assistant, Grab bars   Therapy/Group: Individual Therapy  [name] 12/08/2022, 4:06 PM

## 2022-12-08 NOTE — Progress Notes (Addendum)
Speech Language Pathology Daily Session Note  Patient Details  Name: [name] MRN: [id number] Date of Birth: 11/09/1941  Today's Date: 12/08/2022 SLP Individual Time: 0730-0830 SLP Individual Time Calculation (min): 60 min  Short Term Goals: Week 2: SLP Short Term Goal 1 (Week 2): STGs=LTGs due to ELOS  Skilled Therapeutic Interventions: Pt seen this date for skilled ST intervention targeting cognitive goals outlined in care plan. Pt greeted awake/alert and sitting EOB and receiving assistance from her husband for donning thigh high TED hose; SLP assisted husband given he appeared to be having Stephanie hard time manipulating hose. Abdominal binder also donned prior to transfer from bed to w/c. Agreeable to intervention in speech office. Participatory throughout, though did appear somewhat anxious and forgetful. Intermittent cough and throat clearing noted in the absence of PO.  Today's session with emphasis on cognitive skill training within the context of functional BADL and IADL tasks. When competing BADL tasks to include brushing teeth, grooming, and getting dressed, pt benefited from [name] Stephanie verbal cues for slowed pace and to improve accuracy with sequencing and organization of task. Transported to speech therapy office for structured cognitive tasks. Pt continues to report some anxiety with upcoming discharge home; SLP provided supportive listening and reinforcement for current progress. Upon inquiry, pt reports that she would like to continue addressing medication management due to pt being unable to recall all of her current medications and purpose for each.   Overall, pt benefited from Min-Mod Stephanie verbal cues for recall of purpose of current medications when provided with medication name. Pt reports uncertainity when pronouncing the name of her current medications and that she does have concerns given her husband did not administer her blood thinner prior to this most recent CVA. Pt reports she has  been fatigued and declined the medication from her husband. SLP facilitated recall of name and purpose of medications + improving agency by providing extensive education re: use of chunking/association and visualization strategies to aid in recall of current medications. SLP provided picture of the body and outlined areas that are being addressed with her current medications, as well as providing mnemonics to aid in recalling list of medications. Pt appeared most receptive and appreciative of drawing of body and listing the medications that were addressing the body parts outlined. With Min Stephanie verbal cues for encouragement and arcuate pronunciation, pt listed 13 out of 14 current medications when  referencing external aid created during today's session.   Informally, pt provided with thin water to encourage fluids and assess tolerance; no clinical s/sx concerning for aspiration observed.  Pt returned to room and left OOB in w/c with all safety measures activated and call bell within reach. Spouse at bedside. Continue per current ST POC.   Pain No pain reported; NAD  Therapy/Group: Individual Therapy  [name] [name] 12/08/2022, 11:08 AM

## 2022-12-08 NOTE — Progress Notes (Signed)
Occupational Therapy Session Note  Patient Details  Name: [name] MRN: [id number] Date of Birth: [birth year]  Today's Date: 12/08/2022 OT Individual Time: 1105-1200 OT Individual Time Calculation (min): 55 min    Short Term Goals: Week 2:  OT Short Term Goal 1 (Week 2): STGs=LTGs due to patient's length of stay.  Skilled Therapeutic Interventions/Progress Updates:  Pt received seated in [facility] for skilled OT session with focus on L-hand strengthening/coordination. Pt agreeable to interventions, demonstrating overall pleasant mood. Pt with no reports of pain this session. OT offering intermediate rest breaks and positioning suggestions throughout session to address potential pain/fatigue and maximize participation/safety in session.   Pt dependent for WC transport from room>therapy gym for time management. In therapy gym, pt participates in series of L-hand strengthening exercises with yellow foam-block: -Gross Grip -Three-finger Pinch -Index to Thumb (tip-to-tip) Pinch Pt performs 2 sets/10 reps of each exercise.  Pt then finds/pulls out 5 beads from putty with use of gross hand movements, and pincer grasp.   Pt and OT discuss the importance of self-care and safe pacing during daily activities.  Pt then completes household-level functional mobility from therapy gym>room with overall CGA + RW, with min cuing for safe/slow pacing.   Pt remained seated in [facility] with all immediate needs met and husband present at end of session. Pt continues to be appropriate for skilled OT intervention to promote further functional independence.   Therapy Documentation Precautions:  Precautions Precautions: Fall Precaution Comments: Aspiration Restrictions Weight Bearing Restrictions: No    Therapy/Group: Individual Therapy  Maudie Mercury, OTR/L, MSOT  12/08/2022, 6:06 AM

## 2022-12-08 NOTE — Progress Notes (Signed)
Physical Therapy Session Note  Patient Details  Name: [name] MRN: [id number] Date of Birth: 09/14/1941  Today's Date: 12/08/2022 PT Individual Time: 1447-1530 PT Individual Time Calculation (min): 43 min   Short Term Goals: Week 2:  PT Short Term Goal 1 (Week 2): = to LTGs based on ELOS  Skilled Therapeutic Interventions/Progress Updates: Pt presents sitting in w/c w/ spouse present and agreeable to therapy.  Pt wheeled to main gym for time conservation.  Pt BP monitored in sitting at 155/73, standing 134/79 and after amb of 100' at 152/71, all w/ abd binder.  Pt monitored at sitting 154/72, standing 128/85 and after amb 166/73 w/o abd binder.  Pt has no c/o symptoms including fatigue during session.  Pt performed standing toe-taps to 6" platform w/ CGA.  Pt returned to room and then states c/o dizziness, but resolved w/in 10 seconds.  Nsg notified of BP readings and c/o dizziness in room and of resolved symptoms.  Pt remained sitting in w/c w/ spouse present and all needs in reach.     Therapy Documentation Precautions:  Precautions Precautions: Fall Precaution Comments: Aspiration Restrictions Weight Bearing Restrictions: No General:   Vital Signs: Therapy Vitals Temp: 98.6 F (37 C) Pulse Rate: 99 Resp: 16 BP: (!) 141/67 Patient Position (if appropriate): Sitting Oxygen Therapy SpO2: 99 % O2 Device: Room Air Pain:0/10 Pain Assessment Pain Scale: 0-10 Pain Score: 0-No pain     Therapy/Group: Individual Therapy  Ladoris Gene 12/08/2022, 3:50 PM

## 2022-12-09 ENCOUNTER — Ambulatory Visit: Payer: Medicare HMO | Admitting: Occupational Therapy

## 2022-12-09 ENCOUNTER — Encounter: Payer: Medicare HMO | Admitting: Speech Pathology

## 2022-12-09 ENCOUNTER — Other Ambulatory Visit: Payer: Self-pay | Admitting: Physical Medicine and Rehabilitation

## 2022-12-09 ENCOUNTER — Ambulatory Visit: Payer: Medicare HMO

## 2022-12-09 DIAGNOSIS — I639 Cerebral infarction, unspecified: Secondary | ICD-10-CM | POA: Diagnosis not present

## 2022-12-09 MED ORDER — DULOXETINE HCL 30 MG PO CPEP: 30.0000 mg | ORAL_CAPSULE | Freq: Every day | ORAL | 0 refills | Status: DC

## 2022-12-09 MED ORDER — CLOPIDOGREL BISULFATE 75 MG PO TABS: 75.0000 mg | ORAL_TABLET | Freq: Every day | ORAL | 0 refills | Status: AC

## 2022-12-09 MED ORDER — FOLIC ACID 800 MCG PO TABS: 800.0000 ug | ORAL_TABLET | Freq: Every day | ORAL | 0 refills | Status: AC

## 2022-12-09 MED ORDER — PROPRANOLOL HCL ER 60 MG PO CP24: 60.0000 mg | ORAL_CAPSULE | Freq: Every day | ORAL | 0 refills | Status: AC

## 2022-12-09 MED ORDER — GABAPENTIN 300 MG PO CAPS: 300.0000 mg | ORAL_CAPSULE | Freq: Two times a day (BID) | ORAL | 0 refills | Status: AC

## 2022-12-09 MED ORDER — DICLOFENAC SODIUM 1 % EX GEL: 2.0000 g | Freq: Four times a day (QID) | CUTANEOUS | 0 refills | Status: AC

## 2022-12-09 MED ORDER — ATORVASTATIN CALCIUM 40 MG PO TABS: 40.0000 mg | ORAL_TABLET | Freq: Every day | ORAL | 0 refills | Status: AC

## 2022-12-09 MED ORDER — MONTELUKAST SODIUM 10 MG PO TABS: 10.0000 mg | ORAL_TABLET | Freq: Every day | ORAL | 0 refills | Status: DC

## 2022-12-09 MED ORDER — POLYSACCHARIDE IRON COMPLEX 150 MG PO CAPS: 150.0000 mg | ORAL_CAPSULE | Freq: Every day | ORAL | 0 refills | Status: DC

## 2022-12-09 MED ORDER — ALBUTEROL SULFATE HFA 108 (90 BASE) MCG/ACT IN AERS: 2.0000 | INHALATION_SPRAY | RESPIRATORY_TRACT | 0 refills | Status: AC | PRN

## 2022-12-09 MED ORDER — ASPIRIN 81 MG PO TBEC: 81.0000 mg | DELAYED_RELEASE_TABLET | Freq: Every day | ORAL | 0 refills | Status: DC

## 2022-12-09 MED ORDER — AMLODIPINE BESYLATE 2.5 MG PO TABS: 2.5000 mg | ORAL_TABLET | Freq: Every day | ORAL | 0 refills | Status: DC

## 2022-12-09 MED ORDER — PREDNISONE 10 MG PO TABS: 10.0000 mg | ORAL_TABLET | Freq: Every day | ORAL | 0 refills | Status: DC

## 2022-12-09 MED ORDER — ACETAMINOPHEN 325 MG PO TABS: 650.0000 mg | ORAL_TABLET | ORAL | Status: AC | PRN

## 2022-12-09 MED ORDER — LEVOTHYROXINE SODIUM 50 MCG PO TABS: 50.0000 ug | ORAL_TABLET | Freq: Every day | ORAL | 0 refills | Status: DC

## 2022-12-09 MED ORDER — PANTOPRAZOLE SODIUM 40 MG PO TBEC: 40.0000 mg | DELAYED_RELEASE_TABLET | Freq: Two times a day (BID) | ORAL | 0 refills | Status: DC

## 2022-12-09 NOTE — Plan of Care (Signed)
  Problem: RH Problem Solving Goal: LTG Patient will demonstrate problem solving for (SLP) Description: LTG:  Patient will demonstrate problem solving for basic/complex daily situations with cues  (SLP) Outcome: Completed/Met   Problem: RH Memory Goal: LTG Patient will use memory compensatory aids to (SLP) Description: LTG:  Patient will use memory compensatory aids to recall biographical/new, daily complex information with cues (SLP) Outcome: Completed/Met   Problem: RH Attention Goal: LTG Patient will demonstrate this level of attention during functional activites (SLP) Description: LTG:  Patient will will demonstrate this level of attention during functional activites (SLP) Outcome: Completed/Met   

## 2022-12-09 NOTE — Progress Notes (Signed)
PROGRESS NOTE   Subjective/Complaints:  Pt looking forward to d.c. in am, would prefer OP PT/OT Was going to [facility]  One episode of dizziness just after ambulation  ROS-  Pt denies SOB, abd pain, CP, N/V/C/D, and vision changes    Objective:   No results found. No results for input(s): "WBC", "HGB", "HCT", "PLT" in the last 72 hours.  No results for input(s): "NA", "K", "CL", "CO2", "GLUCOSE", "BUN", "CREATININE", "CALCIUM" in the last 72 hours.   Intake/Output Summary (Last 24 hours) at 12/09/2022 0746 Last data filed at 12/08/2022 2106 Gross per 24 hour  Intake 700 ml  Output --  Net 700 ml         Physical Exam: Vital Signs Blood pressure (!) 133/59, pulse 74, temperature 98.2 F (36.8 C), temperature source Axillary, resp. rate 17, height '4\' 11"'$  (1.499 m), weight 51.3 kg, SpO2 98 %.   General: No acute distress Mood and affect are appropriate Heart: Regular rate and rhythm no rubs murmurs or extra sounds Lungs: Clear to auscultation, breathing unlabored, no rales or wheezes Abdomen: Positive bowel sounds, soft nontender to palpation, nondistended Extremities: No clubbing, cyanosis, or edema   Skin: warm and dry Neuro: Alert and oriented to place, name, month and year and situation.  Follows commands.  Left facial droop.  EOMI.  PERRL Sensation intact light touch in all 4 extremities Strength 5 out of 5 in right lower extremities and right upper extremity Strength 4+ out of 5 left lower extremity Strength 3 out of 5 proximal left upper extremity  Musculoskeletal: No joint swelling or tenderness noted, no abnormal range of motion noted No evidence of Dupuytren's contracture in Left hand , extensory lag left index finger, tone related   Assessment/Plan: 1. Functional deficits which require 3+ hours per day of interdisciplinary therapy in a comprehensive inpatient rehab setting. Physiatrist is providing  close team supervision and 24 hour management of active medical problems listed below. Physiatrist and rehab team continue to assess barriers to discharge/monitor patient progress toward functional and medical goals  Care Tool:  Bathing    Body parts bathed by patient: Right arm, Left arm, Chest, Abdomen, Face, Right upper leg, Left upper leg, Front perineal area, Buttocks, Right lower leg, Left lower leg         Bathing assist Assist Level: Minimal Assistance - Patient > 75% (For standing balance.)     Upper Body Dressing/Undressing Upper body dressing   What is the patient wearing?: Pull over shirt    Upper body assist Assist Level: Set up assist    Lower Body Dressing/Undressing Lower body dressing      What is the patient wearing?: Underwear/pull up, Pants     Lower body assist Assist for lower body dressing: Minimal Assistance - Patient > 75% (For standing balance.)     Toileting Toileting    Toileting assist Assist for toileting: Minimal Assistance - Patient > 75%     Transfers Chair/bed transfer  Transfers assist     Chair/bed transfer assist level: Moderate Assistance - Patient 50 - 74%     Locomotion Ambulation   Ambulation assist      Assist  level: Contact Guard/Touching assist Assistive device: Walker-rolling Max distance: 100   Walk 10 feet activity   Assist     Assist level: Contact Guard/Touching assist Assistive device: Walker-rolling   Walk 50 feet activity   Assist    Assist level: Contact Guard/Touching assist Assistive device: Walker-rolling    Walk 150 feet activity   Assist Walk 150 feet activity did not occur: Safety/medical concerns         Walk 10 feet on uneven surface  activity   Assist Walk 10 feet on uneven surfaces activity did not occur: Safety/medical concerns         Wheelchair     Assist Is the patient using a wheelchair?: Yes (for transport) Type of Wheelchair: Manual     Wheelchair assist level: Dependent - Patient 0%      Wheelchair 50 feet with 2 turns activity    Assist        Assist Level: Dependent - Patient 0%   Wheelchair 150 feet activity     Assist      Assist Level: Dependent - Patient 0%   Blood pressure (!) 133/59, pulse 74, temperature 98.2 F (36.8 C), temperature source Axillary, resp. rate 17, height '4\' 11"'$  (1.499 m), weight 51.3 kg, SpO2 98 %.  Medical Problem List and Plan: 1. 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  9 mm right occipital pole infarct.  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             -patient may  shower             -ELOS/Goals: 12/10/22          Con't CIR- PT, OT and SLP-   2.  Antithrombotics: -DVT/anticoagulation:  Pharmaceutical: Lovenox             -antiplatelet therapy: Aspirin 81 mg daily and Plavix 75 mg daily x 21 days then Plavix alone D/C ASA on 12/14/22 3. Pain Management: Neurontin 300 mg 3 times daily at home. Will decrease to 300 BID based on pharmacy recommendation.  Tylenol as needed Severe OA left knee, cont Voltaren , discussed rec for no elective surgery for ~32mopost stroke   12/30-12/31-  pain tolerable- con't regimen 4. Mood/Behavior/Sleep: Cymbalta 30 mg daily provide emotional support             -antipsychotic agents: N/A 5. Neuropsych/cognition: This patient is capable of making decisions on her own behalf. 6. Skin/Wound Care: Routine skin checks 7. Fluids/Electrolytes/Nutrition: Routine in and outs with follow-up chemistries 8.  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  Latest creatinine improved 1.28.  Follow-up chemistries 9.  Aspiration pneumonia finishing course of Augmentin 10.  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   Follow-up outpatient as needed.  Follow-up CT scan with IV contrast if possible in a few weeks.  If still present consider MRI. 11.  Hypothyroidism.  Synthroid 12.  Chronic diastolic congestive heart failure.  Monitor for any signs of fluid overload. Monitor weight -12/25 weight appears stable, no signs overload  12/30- weight reducing- but last weight 12/28- will speak with nursing about this. Is ordered  12/31- Weight basically stable- con't to monitor trend Filed Weights   12/07/22 0601 12/08/22 0500 12/09/22 0458  Weight: 50.6 kg 50.1 kg 51.3 kg  13.  Iron deficiency anemia.  Follow-up CBC.  Continue Niferex daily 14.  History of asthma.  Continue inhalers as directed as well as chronic prednisone 10 mg daily 15.  Hyperlipidemia.  Lipitor 40 mg 16.  Hypertension. BP more consistently elevated restart low dose amlodipineoverall.  Monitor with increased   -12/26 Restart inderal '60mg'$  12/27 will d/c inderal due to worsening orthostasis  12/30- Tachycardia worse with stopping inderal and BP rising some- 183FPOI systolic.       12/09/2022    4:58 AM 12/09/2022    4:46 AM 12/08/2022    7:39 PM  Vitals with BMI  Weight 113 lbs 2 oz    BMI 51.89    Systolic  842 103  Diastolic  59 63  Pulse  74 89   Restart amlodipine- check ortho vitals  17.  Abdominal pain and coffee-ground emesis.  Evaluated by GI no procedures recommended.  Continue pantoprazole and follow-up with GI outpatient.  May need outpatient EGD. 18.  Hypomagnesemia.  Resolved  12/25 MG 1.8 today stable 19.  Hypokalemia.  Recheck level 20. Constipation: resolved, stool now loose, discussed initial constipation could have been from iron 21. Fatigue  -Check orthostatic VS, Ted hose 22. Hypokalemia  -12/22 Replete with KCL, improved to 3.5 -12/25 recheck tomorrow 12/30- K+ 3.7 23. Loose stool: d/c miralax, resolved. D/c magnesium.  24. Hallucinations:resolved  UA/UC ordered.   -U/A with WBC, many bact , neg nitrite, UCx  >100K Kleb P , complete keflex in am    12/30- Klebsiella pneumonia- per sensitivities, is OK.   Occipital infarcts may be causing visual misperceptions     LOS: 12 days A FACE TO FACE EVALUATION WAS PERFORMED  Charlett Blake 12/09/2022, 7:46 AM

## 2022-12-09 NOTE — Progress Notes (Signed)
Speech Language Pathology Discharge Summary  Patient Details  Name: [name] MRN: [id number] Date of Birth: [birth year]  Date of Discharge from [facility] service:December 09, 2022  Patient has met 3 of 3 long term goals.  Patient to discharge at overall Supervision;Min level.   Reasons goals not met: N/A  Clinical Impression/Discharge Summary: Patient has made functional gains and has met 3 of 3 LTGs this admission. Currently, patient requires supervision-Min verbal cues to complete functional and mildly complex tasks safely in regards to problem solving, recall with use of strategies, and sustained attention. At times, patient's overall function can be impacted by anxiety. Patient and family education is complete and patient will discharge home with 24 hour supervision. Patient would benefit from f/u SLP services to maximize her cognitive functioning and overall functional independence prior to discharge.   Care Partner:  Caregiver Able to Provide Assistance: Yes  Type of Caregiver Assistance: Physical;Cognitive  Recommendation:  Outpatient SLP;24 hour supervision/assistance  Rationale for SLP Follow Up: Reduce caregiver burden;Maximize cognitive function and independence   Equipment: N/A   Reasons for discharge: Discharged from hospital;Treatment goals met   Patient/Family Agrees with Progress Made and Goals Achieved: Yes    [place], [place] 12/09/2022, 10:19 AM

## 2022-12-09 NOTE — Progress Notes (Signed)
Inpatient Rehabilitation Care Coordinator Discharge Note   Patient Details  Name: [name] MRN: [id number] Date of Birth: 1941/08/25   Discharge location: Home  Length of Stay: 13 Days  Discharge activity level: Sup/cga  Home/community participation: spouse, [name]  Patient response EL:FYBOFB Literacy - How often do you need to have someone help you when you read instructions, pamphlets, or other written material from your doctor or pharmacy?: Never  Patient response PZ:WCHENI Isolation - How often do you feel lonely or isolated from those around you?: Rarely  Services provided included: SW, Pharmacy, Neuropsych, TR, CM, RN, SLP, OT, PT, RD, MD  Financial Services:  Financial Services Utilized: [place] [employer]  Choices offered to/list presented to: pt and spouse  Follow-up services arranged:  Outpatient    Outpatient Servicies: Please allow 3-7 Days for scheduling to reach out.      Patient response to transportation need: Is the patient able to respond to transportation needs?: Yes In the past 12 months, has lack of transportation kept you from medical appointments or from getting medications?: No In the past 12 months, has lack of transportation kept you from meetings, work, or from getting things needed for daily living?: No    Comments (or additional information):  Patient/Family verbalized understanding of follow-up arrangements:  Yes  Individual responsible for coordination of the follow-up plan: [name]  Confirmed correct DME delivered: Dyanne Iha 12/09/2022    Dyanne Iha

## 2022-12-09 NOTE — Progress Notes (Signed)
Occupational Therapy Session Note  Patient Details  Name: [name] MRN: [id number] Date of Birth: 10/02/1941  Today's Date: 12/09/2022 OT Individual Time: 1043-1106 OT Individual Time Calculation (min): 23 min  OT Individual Time: 1430-1530 OT Individual Time Calculation (min): 60 min   Short Term Goals: Week 2:  OT Short Term Goal 1 (Week 2): STGs=LTGs due to patient's length of stay.  Skilled Therapeutic Interventions/Progress Updates:     AM Session: Pt received resting in wc in good spirits and receptive to skilled OT session. Pt reporting 0/10 pain. Pt reporting decrease in orthostatic hypotension symptoms today. Focus of session family education and d/c planning. Pt and family educated on importance of providing Pt 24/7 supervision upon d/c with both being in agreement. Pt and husband practiced transferred to and from toilet, bed, and wc with mod verbal cues provided for safety, body mechanics, and pacing. Pt provided handout for activities to complete upon d/c to increase functional use of LUE. Pt left resting in wc with call bell in reach, husband present in room, and all needs met.   PM Session: Pt received sitting up in wc in good spirits and receptive to skilled OT session. Pt presenting with improvement in moral following full day of therapy session. Pt stating she is looking forward to returning home and she is feeling more confident in her mobility. Focus this session BADL retraining, family education, and d/c planning.  Pt ambulated during session using RW close supervision with mod verbal cues required for safety and completed transfers <> toilet, wc, and shower chair with close supervision with cues required for pacing and safety. Pt able to complete U/LB bathing and dressing with supervision.  Pt and husband educated on orthostatic hypotension treatment and symptoms, energy conservation, home safety modifications, and plan for follow up therapy services. Pt and husband  attentive during session demonstrating teach back through answering questions/ providing practical examples. Pt was left resting in bed with call bell in reach, bed alarm on, husband in room, and all needs met.   Therapy Documentation Precautions:  Precautions Precautions: Fall Precaution Comments: Aspiration Restrictions Weight Bearing Restrictions: No General:   Vital Signs: Therapy Vitals Temp: 98 F (36.7 C) Temp Source: Oral Pulse Rate: (Abnormal) 101 Resp: 17 BP: (Abnormal) 150/75 Patient Position (if appropriate): Lying Oxygen Therapy SpO2: 98 % O2 Device: Room Air Pain: Pain Assessment Pain Scale: 0-10 Pain Score: 0-No pain ADL: ADL Eating: Set up Where Assessed-Eating: Wheelchair Grooming: Supervision/safety Where Assessed-Grooming: Standing at sink Upper Body Bathing: Supervision/safety Where Assessed-Upper Body Bathing: Shower Lower Body Bathing: Supervision/safety Where Assessed-Lower Body Bathing: Shower Upper Body Dressing: Supervision/safety Where Assessed-Upper Body Dressing: Edge of bed Lower Body Dressing: Supervision/safety Where Assessed-Lower Body Dressing: Edge of bed Toileting: Supervision/safety Where Assessed-Toileting: Glass blower/designer: Close supervision Toilet Transfer Method: Counselling psychologist: Geophysical data processor: Close supervision Social research officer, government Method: Heritage manager: Radio broadcast assistant, Grab bars Vision Baseline Vision/History: 1 Wears glasses Patient Visual Report: No change from baseline Perception  Perception: Within Functional Limits Praxis Praxis: Impaired Praxis Impairment Details: Environmental health practitioner Assessed: Yes Static Sitting Balance Static Sitting - Balance Support: Feet supported Static Sitting - Level of Assistance: 7: Independent Dynamic Sitting Balance Dynamic Sitting - Balance Support: Feet supported Dynamic  Sitting - Level of Assistance: 5: Stand by assistance Dynamic Sitting - Balance Activities: Forward lean/weight shifting;Lateral lean/weight shifting Static Standing Balance Static Standing - Balance Support: During functional activity Static Standing -  Level of Assistance: 5: Stand by assistance (supervision) Dynamic Standing Balance Dynamic Standing - Balance Support: During functional activity Dynamic Standing - Level of Assistance: 5: Stand by assistance (supervision) Dynamic Standing - Balance Activities: Forward lean/weight shifting;Reaching for objects   Therapy/Group: Individual Therapy  [name] 12/09/2022, 3:28 PM

## 2022-12-09 NOTE — Discharge Summary (Signed)
Physical Therapy Discharge Summary  Patient Details  Name: [name] MRN: [id number] Date of Birth: Apr 25, 1941  Date of Discharge from PT service:December 09, 2022  Today's Date: 12/09/2022 PT Individual Time: 1106-1206 PT Individual Time Calculation (min): 60 min    Patient has met 9 of 9 long term goals due to improved activity tolerance, improved balance, improved postural control, ability to compensate for deficits, functional use of  left upper extremity and left lower extremity, improved attention, and improved awareness.  Patient to discharge at an ambulatory level CGA using RW.   Patient's care partner participated in hands-on education/training and is independent to provide the necessary physical and cognitive assistance at discharge.  All goals met.  Recommendation:  Patient will benefit from ongoing skilled PT services in outpatient setting to continue to advance safe functional mobility, address ongoing impairments in dynamic standing balance, gait training using LRAD, activity tolerance (with BP management), and minimize fall risk.  Equipment: No equipment provided, pt has all necessary DME  Reasons for discharge: treatment goals met and discharge from hospital  Patient/family agrees with progress made and goals achieved: Yes  Skilled Therapeutic Interventions/Progress Updates:  Pt received sitting in w/c as hand-off from OT and pt agreeable to continue with this therapy session. Pt already wearing thigh high TED hose. Pt's husband, [name], present and participated in education with hands-on training during session today.   Transported to/from gym in w/c for time management and energy conservation.  Simulated ambulatory car transfer (small SUV height) using RW x1 with therapist providing CGA for steadying and assist for AD management then repeated with pt's husband providing assistance, demonstrating understanding. Pt having to perform side step-in technique due to her height  and car seat height with good balance using UE support on car.   Ambulated ~66f up/down ramp using RW with CGA provided by her husband and therapist providing education on safe guarding technique.   Ambulated ~776f using RW, through ADL apartment to practice navigating through home environment using RW with pt's husband providing CGA and doing well at providing pt cuing for safety and navigation without therapist's prompting.  Pt's husband reports his plan will be to assist pt in/out of the house using front entrance with only 2 steps but no HRs (states the stairs are wide enough for them both) opposed to using the 6 STE from garage with B HRs. Pt's husband reports pt has a chair immediately inside front door where pt can sit and wait for him to retrieve her RW.   Stair navigation training ascending/descending 2 (8" height) steps with only L HHA from therapist to simulate home entry with CGA for steadying and cuing for step-to pattern leading with R LE on ascent and L LE on descent. Pt repeated with her husband providing hands-on assistance, demonstrating understanding of proper technique.   Performed 8 stair navigation using B HRs with pt starting off with reciprocal pattern on ascent but educated/cued on performing step-to pattern leading with R LE on ascent and L LE on descent with pt demoing much improved balance requiring on CGA for safety/steadying from her husband.  Pt's husband reports feeling confident assisting pt at D/C and denies any questions at this time.  Pt participated in 1 set of each of the following exercises with her husband, DaShanon Browproviding hands-on assistance to ensure her safety while therapist provided verbal/visual instructions on proper form/technique of each exercise. Educated pt and husband on pt's need for his assistance to safely perform these  at home and both verbalize understanding.  Access Code: [email or web address] URL: [email or web address] Date:  12/09/2022 Prepared by: Page Spiro  Exercises - Sit to Stand with Arms Crossed  - 1 x daily - 7 x weekly - 2 sets - 10 reps - Narrow Stance with Counter Support  - 1 x daily - 7 x weekly - 2 sets - 30 seconds hold - Standing Balance with Eyes Closed  - 1 x daily - 7 x weekly - 2 sets - 30 seconds hold - Standing with Body Rotations  - 1 x daily - 7 x weekly - 2 sets - 10 reps - Alternating Step Taps with Counter Support  - 1 x daily - 7 x weekly - 2 sets - 10 reps  Provided HEP printout.  Transported back to her room and pt left seated in w/c with needs in reach and husband assuming care of pt.  PT Discharge Precautions/Restrictions Precautions Precautions: Fall Restrictions Weight Bearing Restrictions: No Pain Pain Assessment Pain Scale: 0-10 Pain Score: 0-No pain Pain Interference Pain Interference Pain Effect on Sleep: 1. Rarely or not at all Pain Interference with Therapy Activities: 1. Rarely or not at all Pain Interference with Day-to-Day Activities: 1. Rarely or not at all Vision/Perception  Vision - History Ability to See in Adequate Light: 1 Impaired Perception Perception: Within Functional Limits Praxis Praxis: Impaired Praxis Impairment Details: Motor planning  Cognition Overall Cognitive Status: Impaired/Different from baseline Arousal/Alertness: Awake/alert Orientation Level: Oriented X4 Attention: Focused;Sustained Focused Attention: Appears intact Sustained Attention: Impaired Memory: Impaired Awareness: Impaired Safety/Judgment: Impaired Sensation Sensation Light Touch: Appears Intact Hot/Cold: Not tested Proprioception: Impaired by gross assessment Proprioception Impaired Details: Impaired LLE (continues to demo impaired proprioceptive awareness of L LE during functional mobility) Coordination Gross Motor Movements are Fluid and Coordinated: Yes Coordination and Movement Description: GM movements are [facility] for daily functional mobility tasks  using RW despite 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) Motor  Motor Motor: Hemiplegia Motor - Discharge Observations: continues with mild L hemiparesis (UE>LE) although improved since initial eval  Mobility Bed Mobility Bed Mobility: Supine to Sit;Sit to Supine Supine to Sit: Supervision/Verbal cueing Sit to Supine: Supervision/Verbal cueing Transfers Transfers: Sit to Stand;Stand to Sit;Stand Pivot Transfers Sit to Stand: Supervision/Verbal cueing Stand to Sit: Supervision/Verbal cueing Stand Pivot Transfers: Contact Guard/Touching assist Stand Pivot Transfer Details: Verbal cues for safe use of DME/AE;Verbal cues for technique;Verbal cues for precautions/safety Transfer (Assistive device): Rolling walker Locomotion  Gait Ambulation: Yes Gait Assistance: Contact Guard/Touching assist Gait Distance (Feet): 200 Feet Assistive device: Rolling walker Gait Assistance Details: Verbal cues for safe use of DME/AE;Verbal cues for precautions/safety;Verbal cues for gait pattern;Verbal cues for technique Gait Gait: Yes Gait Pattern: Within Functional Limits Gait Pattern: Step-through pattern;Narrow base of support Gait velocity: decreased Stairs / Additional Locomotion Stairs: Yes Stairs Assistance: Contact Guard/Touching assist Stair Management Technique: Two rails;Alternating pattern;Step to pattern;Forwards Number of Stairs: 12 Height of Stairs: 6 Ramp: Contact Guard/touching assist (using RW) Curb: Contact Guard/Touching assist (using HHA) Wheelchair Mobility Wheelchair Mobility: No  Trunk/Postural Assessment  Cervical Assessment Cervical Assessment: Exceptions to [facility] (limited L cervical rotation ROM due to pt report of prior L "shoulder injury") Thoracic Assessment Thoracic Assessment: Exceptions to [facility] (shoulders slightly rounded) Lumbar Assessment Lumbar Assessment: Within Functional Limits Postural Control Postural Control: Deficits on evaluation Righting Reactions:  significantly improved since initial evaluation and adequate for minor perturbations but requires assistance for moderate or greater Protective Responses: improved protective balance awareness and improved onset of responses  Balance Standardized Balance Assessment Standardized Balance Assessment: Berg Balance Test (from assessment on 12/06/22) Berg Balance Test Sit to Stand: Able to stand without using hands and stabilize independently Standing Unsupported: Able to stand 2 minutes with supervision Sitting with Back Unsupported but Feet Supported on Floor or Stool: Able to sit safely and securely 2 minutes Stand to Sit: Sits safely with minimal use of hands Transfers: Able to transfer safely, minor use of hands Standing Unsupported with Eyes Closed: Able to stand 10 seconds with supervision (minor anterior/posterior sway) Standing Ubsupported with Feet Together: Able to place feet together independently and stand for 1 minute with supervision (slight sway) From Standing, Reach Forward with Outstretched Arm: Reaches forward but needs supervision (slight anterior sway) From Standing Position, Pick up Object from Floor: Unable to try/needs assist to keep balance (has anterior LOB (with anterior/posterior sway)) From Standing Position, Turn to Look Behind Over each Shoulder: Needs supervision when turning Turn 360 Degrees: Needs assistance while turning (LOB when turning towards L due to L LE not stepping out enough) Standing Unsupported, Alternately Place Feet on Step/Stool: Able to complete >2 steps/needs minimal assist (persistent L lateral LOB) Standing Unsupported, One Foot in Front: Needs help to step but can hold 15 seconds (repeated L LOB) Standing on One Leg: Unable to try or needs assist to prevent fall Total Score: 29 Static Sitting Balance Static Sitting - Balance Support: Feet supported Static Sitting - Level of Assistance: 7: Independent Dynamic Sitting Balance Dynamic Sitting -  Balance Support: Feet supported Dynamic Sitting - Level of Assistance: 5: Stand by assistance Static Standing Balance Static Standing - Balance Support: During functional activity;Bilateral upper extremity supported Static Standing - Level of Assistance: 5: Stand by assistance Dynamic Standing Balance Dynamic Standing - Balance Support: During functional activity;Bilateral upper extremity supported Dynamic Standing - Level of Assistance: 5: Stand by assistance;Other (comment) (CGA) Extremity Assessment      RLE Assessment RLE Assessment: Within Functional Limits Active Range of Motion (AROM) Comments: WFL/WNL RLE Strength Right Hip Flexion: 4+/5 Right Knee Flexion: 5/5 Right Knee Extension: 5/5 Right Ankle Dorsiflexion: 5/5 Right Ankle Plantar Flexion: 5/5 LLE Assessment LLE Assessment: Exceptions to [facility] Active Range of Motion (AROM) Comments: WFL/WNL General Strength Comments: pt with L knee varus forces causing impaired joint alignment, pt declines a knee brace at this time LLE Strength Left Hip Flexion: 3+/5 Left Knee Flexion: 4-/5 Left Knee Extension: 4/5 Left Ankle Dorsiflexion: 4/5 Left Ankle Plantar Flexion: 4/5   Rigo Letts M Tymier [place]holm , PT, DPT, NCS, CSRS 12/09/2022, 8:03 AM

## 2022-12-09 NOTE — Plan of Care (Signed)
  Problem: RH Balance Goal: LTG Patient will maintain dynamic standing with ADLs (OT) Description: LTG:  Patient will maintain dynamic standing balance with assist during activities of daily living (OT)  Outcome: Completed/Met   Problem: RH Bathing Goal: LTG Patient will bathe all body parts with assist levels (OT) Description: LTG: Patient will bathe all body parts with assist levels (OT) Outcome: Completed/Met   Problem: RH Dressing Goal: LTG Patient will perform lower body dressing w/assist (OT) Description: LTG: Patient will perform lower body dressing with assist, with/without cues in positioning using equipment (OT) Outcome: Completed/Met   Problem: RH Toileting Goal: LTG Patient will perform toileting task (3/3 steps) with assistance level (OT) Description: LTG: Patient will perform toileting task (3/3 steps) with assistance level (OT)  Outcome: Completed/Met   Problem: RH Tub/Shower Transfers Goal: LTG Patient will perform tub/shower transfers w/assist (OT) Description: LTG: Patient will perform tub/shower transfers with assist, with/without cues using equipment (OT) Outcome: Completed/Met   Problem: RH Memory Goal: LTG Patient will demonstrate ability for day to day recall/carry over during activities of daily living with assistance level (OT) Description: LTG:  Patient will demonstrate ability for day to day recall/carry over during activities of daily living with assistance level (OT). Outcome: Completed/Met

## 2022-12-09 NOTE — Progress Notes (Signed)
Occupational Therapy Discharge Summary  Patient Details  Name: [name] MRN: [id number] Date of Birth: [birth year]  Date of Discharge from OT service:December 09, 2022  Today's Date: 12/09/2022 OT Individual Time: 5329-9242 OT Individual Time Calculation (min): 71 min    Patient has met 6 of 6 long term goals due to improved activity tolerance, improved balance, ability to compensate for deficits, functional use of  LEFT upper extremity, and improved coordination.  Patient to discharge at overall Supervision level.  Patient's care partner is independent to provide the necessary physical and cognitive assistance at discharge.    Reasons goals not met: Pt met all goals at supervision level.   Recommendation:  Patient will benefit from ongoing skilled OT services in outpatient setting to continue to advance functional skills in the area of iADL and Reduce care partner burden.  Equipment: No equipment provided  Reasons for discharge: treatment goals met and discharge from hospital  Patient/family agrees with progress made and goals achieved: Yes  OT Discharge Precautions/Restrictions  Precautions Precautions: Fall Precaution Comments: Aspiration Restrictions Weight Bearing Restrictions: No Pain Pain Assessment Pain Scale: 0-10 Pain Score: 0-No pain ADL ADL Eating: Set up Where Assessed-Eating: Wheelchair Grooming: Supervision/safety Where Assessed-Grooming: Standing at sink Upper Body Bathing: Supervision/safety Where Assessed-Upper Body Bathing: Shower Lower Body Bathing: Supervision/safety Where Assessed-Lower Body Bathing: Shower Upper Body Dressing: Supervision/safety Where Assessed-Upper Body Dressing: Edge of bed Lower Body Dressing: Supervision/safety Where Assessed-Lower Body Dressing: Edge of bed Toileting: Supervision/safety Where Assessed-Toileting: Glass blower/designer: Close supervision Armed forces technical officer Method: Counselling psychologist:  Geophysical data processor: Close supervision Social research officer, government Method: Heritage manager: Radio broadcast assistant, Grab bars Vision Baseline Vision/History: 1 Wears glasses Patient Visual Report: No change from baseline Perception  Perception: Within Functional Limits Praxis Praxis: Impaired Praxis Impairment Details: Motor planning Cognition Cognition Overall Cognitive Status: Impaired/Different from baseline Arousal/Alertness: Awake/alert Memory: Impaired Attention: Focused;Sustained Focused Attention: Appears intact Sustained Attention: Impaired Awareness: Impaired Safety/Judgment: Impaired Sensation Sensation Light Touch: Appears Intact Hot/Cold: Not tested Proprioception: Impaired by gross assessment Proprioception Impaired Details: Impaired LLE (impaired during functional mobility) Stereognosis: Not tested Coordination Gross Motor Movements are Fluid and Coordinated: Yes Fine Motor Movements are Fluid and Coordinated: No Coordination and Movement Description: GM movements are [facility] for daily functional mobility tasks using RW despite 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) Finger Nose Finger Test: L UE undershoots Motor  Motor Motor: Hemiplegia Motor - Discharge Observations: continues with mild L hemiparesis (UE>LE) although improved since initial eval Mobility  Bed Mobility Bed Mobility: Supine to Sit;Sit to Supine Supine to Sit: Supervision/Verbal cueing Sit to Supine: Supervision/Verbal cueing Transfers Sit to Stand: Supervision/Verbal cueing Stand to Sit: Supervision/Verbal cueing  Trunk/Postural Assessment  Cervical Assessment Cervical Assessment: Within Functional Limits Thoracic Assessment Thoracic Assessment: Exceptions to [facility] (shoulders slightly rounded) Lumbar Assessment Lumbar Assessment: Within Functional Limits Postural Control Postural Control: Within Functional Limits Righting Reactions: delayed, however  significantly improved since eval Protective Responses: delayed, however improved since eval  Balance Balance Balance Assessed: Yes Static Sitting Balance Static Sitting - Balance Support: Feet supported Static Sitting - Level of Assistance: 7: Independent Dynamic Sitting Balance Dynamic Sitting - Balance Support: Feet supported Dynamic Sitting - Level of Assistance: 5: Stand by assistance Dynamic Sitting - Balance Activities: Forward lean/weight shifting;Lateral lean/weight shifting Static Standing Balance Static Standing - Balance Support: During functional activity Static Standing - Level of Assistance: 5: Stand by assistance (supervision) Dynamic Standing Balance Dynamic Standing - Balance Support: During  functional activity Dynamic Standing - Level of Assistance: 5: Stand by assistance (supervision) Dynamic Standing - Balance Activities: Forward lean/weight shifting;Reaching for objects Extremity/Trunk Assessment RUE Assessment RUE Assessment: Within Functional Limits General Strength Comments: WFL LUE Assessment LUE Assessment: Exceptions to [facility] Passive Range of Motion (PROM) Comments: [facility] General Strength Comments: Grip 3-/5, 4/5 overall LUE Body System: Neuro Brunstrum levels for arm and hand: Arm;Hand Brunstrum level for arm: Stage IV Movement is deviating from synergy Brunstrum level for hand: Stage V Independence from basic synergies LUE AROM (degrees) Overall AROM Left Upper Extremity: Deficits  Skilled Therapeutic Interventions/Progress Updates:   Pt received seated in [facility] for skilled OT session with focus on HEP and discharge planning. Pt agreeable to interventions, demonstrating overall pleasant mood. Pt with no reports of pain. OT offering intermediate rest breaks and positioning suggestions throughout session to address potential pain/fatigue and maximize participation/safety in session.   Pt performs oral care at sink-level with close supervision. Simple cooking  and cleaning IADLs addressed with pt and husband, no immediate needs/questions.   OT providing re-education in regards to resting-hand split, with patient continuing to express discomfort and feeling "claustrophobic." OT providing education on finger split as a means of addressing L-4th digit flexion. Pt and husband receptive of information, but further treatment deferred to outpatient OT.   Pt dependent for WC transport from room>day room for time management. In day room, pt educated/assigned HEPs for thera-putty, foam block, and wrist ROM exercises.  Pt then completes functional mobility from day room>ortho gym for general conditioning with RW + CGA-SUP. Pt dependent for transport back to room for time management.   Pt remained seated in [facility] with all immediate needs met at end of session. Pt continues to be appropriate for skilled OT intervention to promote further functional independence.   Maudie Mercury, OTR/L, MSOT  12/09/2022, 6:14 AM

## 2022-12-10 NOTE — Progress Notes (Signed)
PROGRESS NOTE   Subjective/Complaints:  Ready for d/c ROS-  Pt denies SOB, abd pain, CP, N/V/C/D, and vision changes    Objective:   No results found. No results for input(s): "WBC", "HGB", "HCT", "PLT" in the last 72 hours.  No results for input(s): "NA", "K", "CL", "CO2", "GLUCOSE", "BUN", "CREATININE", "CALCIUM" in the last 72 hours.   Intake/Output Summary (Last 24 hours) at 12/10/2022 0842 Last data filed at 12/10/2022 0809 Gross per 24 hour  Intake 637 ml  Output --  Net 637 ml         Physical Exam: Vital Signs Blood pressure 134/76, pulse 81, temperature 97.9 F (36.6 C), temperature source Oral, resp. rate 15, height '4\' 11"'$  (1.499 m), weight 51.2 kg, SpO2 99 %.   General: No acute distress Mood and affect are appropriate Heart: Regular rate and rhythm no rubs murmurs or extra sounds Lungs: Clear to auscultation, breathing unlabored, no rales or wheezes Abdomen: Positive bowel sounds, soft nontender to palpation, nondistended Extremities: No clubbing, cyanosis, or edema   Skin: warm and dry Neuro: Alert and oriented to place, name, month and year and situation.  Follows commands.  Left facial droop.  EOMI.  PERRL Sensation intact light touch in all 4 extremities Strength 5 out of 5 in right lower extremities and right upper extremity Strength 4+ out of 5 left lower extremity Strength 3 out of 5 proximal left upper extremity  Musculoskeletal: No joint swelling or tenderness noted, no abnormal range of motion noted No evidence of Dupuytren's contracture in Left hand , extensory lag left index finger, tone related   Assessment/Plan: 1. Functional deficits which require 3+ hours per day of interdisciplinary therapy in a comprehensive inpatient rehab setting. Physiatrist is providing close team supervision and 24 hour management of active medical problems listed below. Physiatrist and rehab team continue  to assess barriers to discharge/monitor patient progress toward functional and medical goals  Care Tool:  Bathing    Body parts bathed by patient: Right arm, Left arm, Chest, Abdomen, Face, Right upper leg, Left upper leg, Front perineal area, Buttocks, Right lower leg, Left lower leg         Bathing assist Assist Level: Supervision/Verbal cueing     Upper Body Dressing/Undressing Upper body dressing   What is the patient wearing?: Pull over shirt    Upper body assist Assist Level: Supervision/Verbal cueing    Lower Body Dressing/Undressing Lower body dressing      What is the patient wearing?: Underwear/pull up, Pants     Lower body assist Assist for lower body dressing: Supervision/Verbal cueing     Toileting Toileting    Toileting assist Assist for toileting: Supervision/Verbal cueing     Transfers Chair/bed transfer  Transfers assist     Chair/bed transfer assist level: Contact Guard/Touching assist Chair/bed transfer assistive device: Walker, Clinical biochemist   Ambulation assist      Assist level: Contact Guard/Touching assist Assistive device: Walker-rolling Max distance: 253f   Walk 10 feet activity   Assist     Assist level: Contact Guard/Touching assist Assistive device: Walker-rolling   Walk 50 feet activity   Assist  Assist level: Contact Guard/Touching assist Assistive device: Walker-rolling    Walk 150 feet activity   Assist Walk 150 feet activity did not occur: Safety/medical concerns  Assist level: Contact Guard/Touching assist Assistive device: Walker-rolling    Walk 10 feet on uneven surface  activity   Assist Walk 10 feet on uneven surfaces activity did not occur: Safety/medical concerns   Assist level: Contact Guard/Touching assist Assistive device: Walker-rolling   Wheelchair     Assist Is the patient using a wheelchair?: No Type of Wheelchair: Manual    Wheelchair assist  level: Dependent - Patient 0%      Wheelchair 50 feet with 2 turns activity    Assist        Assist Level: Dependent - Patient 0%   Wheelchair 150 feet activity     Assist      Assist Level: Dependent - Patient 0%   Blood pressure 134/76, pulse 81, temperature 97.9 F (36.6 C), temperature source Oral, resp. rate 15, height '4\' 11"'$  (1.499 m), weight 51.2 kg, SpO2 99 %.  Medical Problem List and Plan: 1. 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  9 mm right occipital pole infarct.  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             -patient may  shower             -ELOS/Goals: 12/10/22          Con't CIR- PT, OT and SLP-   2.  Antithrombotics: -DVT/anticoagulation:  Pharmaceutical: Lovenox             -antiplatelet therapy: Aspirin 81 mg daily and Plavix 75 mg daily x 21 days then Plavix alone D/C ASA on 12/14/22 3. Pain Management: Neurontin 300 mg 3 times daily at home. Will decrease to 300 BID based on pharmacy recommendation.  Tylenol as needed Severe OA left knee, cont Voltaren , discussed rec for no elective surgery for ~38mopost stroke   12/30-12/31-  pain tolerable- con't regimen 4. Mood/Behavior/Sleep: Cymbalta 30 mg daily provide emotional support             -antipsychotic agents: N/A 5. Neuropsych/cognition: This patient is capable of making decisions on her own behalf. 6. Skin/Wound Care: Routine skin checks 7. Fluids/Electrolytes/Nutrition: Routine in and outs with follow-up chemistries 8.  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  Latest creatinine improved 1.28.  Follow-up chemistries 9.  Aspiration pneumonia finishing course of Augmentin 10.  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  Follow-up outpatient as needed.   Follow-up CT scan with IV contrast if possible in a few weeks.  If still present consider MRI. 11.  Hypothyroidism.  Synthroid 12.  Chronic diastolic congestive heart failure.  Monitor for any signs of fluid overload. Monitor weight -12/25 weight appears stable, no signs overload  12/30- weight reducing- but last weight 12/28- will speak with nursing about this. Is ordered  12/31- Weight basically stable- con't to monitor trend Filed Weights   12/08/22 0500 12/09/22 0458 12/10/22 0500  Weight: 50.1 kg 51.3 kg 51.2 kg    13.  Iron deficiency anemia.  Follow-up CBC.  Continue Niferex daily 14.  History of asthma.  Continue inhalers as directed as well as chronic prednisone 10 mg daily 15.  Hyperlipidemia.  Lipitor 40 mg 16.  Hypertension. BP more consistently elevated restart low dose amlodipineoverall.  Monitor with increased   -12/26 Restart inderal '60mg'$  12/27 will d/c inderal due to worsening orthostasis  12/30- Tachycardia worse with stopping inderal and BP rising some- [id number] systolic.       12/10/2022    6:07 AM 12/10/2022    6:04 AM 12/10/2022    5:00 AM  Vitals with BMI  Weight   112 lbs 14 oz  BMI   56.70  Systolic 141 030   Diastolic 76 75   Pulse 81 72    Restart amlodipine 2.'5mg'$  qd - no orthostatic sx  17.  Abdominal pain and coffee-ground emesis.  Evaluated by GI no procedures recommended.  Continue pantoprazole and follow-up with GI outpatient.  May need outpatient EGD. 18.  Hypomagnesemia.  Resolved   19.  Hypokalemia.  Resolved 20. Constipation: resolved, stool now loose, discussed initial constipation could have been from iron 21. Fatigue  -Check orthostatic VS, Ted hose 22. Hypokalemia  -resolved 23. Loose stool: d/c miralax, resolved. D/c magnesium.  24. Hallucinations:resolved    Tx of UTI  Occipital infarcts may be causing visual misperceptions     LOS: 13 days A FACE TO FACE EVALUATION WAS PERFORMED  Charlett Blake 12/10/2022, 8:42 AM

## 2022-12-10 NOTE — Progress Notes (Signed)
INPATIENT REHABILITATION DISCHARGE NOTE   Discharge instructions by:  Linna Hoff, PA  Verbalized understanding: Yes  Skin care/Wound care healing? None  Pain: none  IV's: none  Tubes/Drains: none  O2: none  Safety instructions: reviewed with pt and family  Patient belongings: sent with pt  Discharged to: home  Discharged via: family transport  Notes: done   Gerald Stabs, Therapist, sports

## 2022-12-10 NOTE — Progress Notes (Signed)
Inpatient Rehabilitation Discharge Medication Review by a Pharmacist  A complete drug regimen review was completed for this patient to identify any potential clinically significant medication issues.  High Risk Drug Classes Is patient taking? Indication by Medication  Antipsychotic No   Anticoagulant No   Antibiotic No   Opioid No   Antiplatelet Yes ASA, plavix- stroke ppx  Hypoglycemics/insulin No   Vasoactive Medication No Propranolol, amlodipine-HTN  Chemotherapy No   Other Yes Albuterol-asthma Atorvastatin-HLD Mucinex DM- cough/cold Folic acid supp Synthroid-hypothyroidism Singulair-asthma Pantoprazole-GERD Cymbalta-depression Gabapentin-neuropathic pain Prednisone-RA Iron-iron supp. Diclofenac topical gel- pain      Type of Medication Issue Identified Description of Issue Recommendation(s)  Drug Interaction(s) (clinically significant)     Duplicate Therapy     Allergy     No Medication Administration End Date     Incorrect Dose     Additional Drug Therapy Needed     Significant med changes from prior encounter (inform family/care partners about these prior to discharge).     Other       Clinically significant medication issues were identified that warrant physician communication and completion of prescribed/recommended actions by midnight of the next day:  No  Name of provider notified for urgent issues identified:   Provider Method of Notification:   Pharmacist comments:   Time spent performing this drug regimen review (minutes):  [place], [place] Clinical Pharmacist  12/10/2022 9:07 AM

## 2022-12-10 NOTE — Progress Notes (Signed)
Recreational Therapy Discharge Summary Patient Details  Name: [name] MRN: [id number] Date of Birth: [birth year] Today's Date: 12/10/2022   Comments on progress toward goals: Pt is discharging home today with husband to provide/coordinate 24 hour supervision/assistance.  Pt expressed difficulty coping with experiencing another stroke and the recovery process & stress it has imposed on her husband.  As as result, TR services focused on pt education in regards to stress management and coping.  Pt and husband both participatory and appreciative.    Reasons for discharge: discharge from hospital  Follow-up: Outpatient  Patient/family agrees with progress made and goals achieved: Yes  [name] 12/10/2022, 8:39 AM

## 2022-12-11 ENCOUNTER — Encounter: Payer: Medicare HMO | Admitting: Speech Pathology

## 2022-12-11 ENCOUNTER — Ambulatory Visit: Payer: Medicare HMO | Admitting: Occupational Therapy

## 2022-12-11 ENCOUNTER — Ambulatory Visit: Payer: Medicare HMO

## 2022-12-16 ENCOUNTER — Ambulatory Visit: Payer: Medicare HMO | Attending: Physical Medicine and Rehabilitation | Admitting: Occupational Therapy

## 2022-12-16 ENCOUNTER — Encounter: Payer: Medicare HMO | Admitting: Speech Pathology

## 2022-12-16 ENCOUNTER — Ambulatory Visit: Payer: Medicare HMO

## 2022-12-16 DIAGNOSIS — R278 Other lack of coordination: Secondary | ICD-10-CM | POA: Insufficient documentation

## 2022-12-16 DIAGNOSIS — M6281 Muscle weakness (generalized): Secondary | ICD-10-CM | POA: Insufficient documentation

## 2022-12-16 NOTE — Therapy (Signed)
OUTPATIENT OCCUPATIONAL THERAPY NEURO EVALUATION  Patient Name: [name] MRN: [id number] DOB:1940/12/14, 82 y.o., Castillo Today's Date: 12/16/2022  PCP: Dr. Doy Hutching REFERRING PROVIDER:  Dr. Doy Hutching  END OF SESSION:  OT End of Session - 12/16/22 1809     Visit Number 1    Number of Visits 24    Date for OT Re-Evaluation 03/10/23    Authorization Time Period Progress report period starting 09/09/2022    OT Start Time 20    OT Stop Time 1750    OT Time Calculation (min) 50 min    Activity Tolerance Patient limited by fatigue    Behavior During Therapy WFL for tasks assessed/performed             Past Medical History:  Diagnosis Date   Anemia    Arthritis    Asthma    uses inhaler just prior to surgery to avoid attack   Back pain    from previous injury   Complication of anesthesia    has woken  up during 2 different surgery   Depression    no current issue/treatment; situation   Gallstones    GERD (gastroesophageal reflux disease)    Hiatal hernia    patient does NOT have nerve/muscle disease   History of kidney stones    HLD (hyperlipidemia)    HTN (hypertension)    Hypothyroidism    Kidney stones    Knee pain    Non-diabetic pancreatic hormone dysfunction years   pt. 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   Pancreatitis    Pneumonia    Seizures ([place])    caused by dye injected during a procedure   Shortness of breath    with exertion   Sinus problem    frequent infections/congestion   Stroke ([place]) 2021   reports having CVA in 2021 and having mini strokes before that   Thyroid disease    Past Surgical History:  Procedure Laterality Date   ABDOMINAL HYSTERECTOMY     APPENDECTOMY     CARPAL TUNNEL RELEASE  10+ years ago   bilateral   EYE SURGERY  3 yrs ago   bilateral cataracts   FOOT OSTEOTOMY  6 weeks ago   Left foot: great, 2nd & 3rd   FOOT OSTEOTOMY  5 years ago   Right great toe   HAND SURGERY Bilateral 2011-most recent   multiple hand  surgeries, 2 on left, 3 on right   KNEE ARTHROPLASTY Right 04/28/2022   Procedure: COMPUTER ASSISTED TOTAL KNEE ARTHROPLASTY;  Surgeon: Dereck Leep, MD;  Location: [facility];  Service: Orthopedics;  Laterality: Right;   LOOP RECORDER INSERTION N/A 05/16/2020   Procedure: LOOP RECORDER INSERTION;  Surgeon: Isaias Cowman, MD;  Location: [facility];  Service: Cardiovascular;  Laterality: N/A;   NASAL SINUS SURGERY  most recent 7-8 yrs ago   7 sinus surgeries    TRIGGER FINGER RELEASE  11/19/2011   Procedure: RELEASE TRIGGER FINGER/A-1 PULLEY;  Surgeon: Wynonia Sours, MD;  Location: [facility];  Service: Orthopedics;  Laterality: Right;  release a-1 pulley right index finger and cyst removal   WRIST GANGLION EXCISION  1980's   right   Patient Active Problem List   Diagnosis Date Noted   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 11/27/2022   Hypomagnesemia 11/27/2022   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   HLD (hyperlipidemia) 11/23/2022   Asthma 11/23/2022   Depression with anxiety 11/23/2022   Chronic diastolic  CHF (congestive heart failure) ([place]) 11/23/2022   Normocytic anemia 11/23/2022   Aspiration pneumonia ([place]) 11/23/2022   AKI (acute kidney injury) ([place]) 11/23/2022   Abdominal pain 11/23/2022   Mesenteric mass 11/23/2022   Coffee ground emesis 11/23/2022   Nausea and vomiting 10/15/2022   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   Fatigue 09/17/2022   Left hemiparesis ([place]) 09/17/2022   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[place]) 08/22/2022   GERD (gastroesophageal reflux disease) 08/21/2022   Agitation 08/20/2022   Acute left-sided weakness 08/20/2022   Expressive aphasia    Stroke ([place]) 08/19/2022   Leukocytosis 08/19/2022   History of urticaria 04/28/2022   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   Primary osteoarthritis of left knee 02/24/2022   Primary osteoarthritis of right knee 02/24/2022   Lumbar spondylolysis 04/12/2020    History of CVA (cerebrovascular accident) 03/26/2020   Low back pain radiating to right lower extremity 03/21/2020   B12 deficiency 03/06/2020   Positive anti-CCP test 12/21/2019   Arthralgia 12/13/2019   Dermatitis 12/13/2019   Rheumatoid factor positive 12/13/2019   Essential hypertension 12/11/2018   Palpitations 12/11/2018   Acquired hypothyroidism 11/10/2018   Arthritis of knee 09/17/2016   Anxiety 11/22/2014   Asthma without status asthmaticus 11/22/2014   Benign neoplasm of colon, unspecified 11/22/2014   Environmental allergies 11/22/2014   Hypertriglyceridemia 11/22/2014   Hypokalemia 11/22/2014   Personal history of disease of skin and subcutaneous tissue 11/22/2014   REFERRING DIAG: CVA   THERAPY DIAG:  Muscle weakness (generalized)   Other lack of coordination   Rationale for Evaluation and Treatment Rehabilitation   SUBJECTIVE:    SUBJECTIVE STATEMENT:  Pt. Had a fall 3 days after leaving the hospital.   Pt accompanied by: significant other   PERTINENT HISTORY: Patient is an [name] who was [facility] to [facility] on [facility]/23/2022 with a cerebellar CVA. 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 Pt. Was admitted to Inpatient Rehab from 12/21-1/02/2023. 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  Patient underwent inpatient rehabilitation for 2 weeks.  Patient was assessed and was scheduled for a knee replacement on 08/29/2022 however had to cancel it due to having had a CVA.  Patient had a recent fall 2 days after discharging from inpatient rehab. Past Medical History includes: Knee replacement, essential HTN, hypokalemia, leukocytosis, seizures, positive anti-- CCP test, anxiety disorder, mini strokes.  Patient had shingles with left eye nerve pain s/p 1 year ago.    PRECAUTIONS: Fall   WEIGHT BEARING RESTRICTIONS No   PAIN:  Are you having pain? No   FALLS: Has patient  fallen in last 6 months? Yes. Number of falls 1   LIVING ENVIRONMENT: Lives with: Lives with Spouse Lives in: House/apartment Stairs: 2 storey home, resides on the first floor.  External: 2 stairs front no rails, and 6 in back with rails Has following equipment at home: Single point cane, Walker - 2 wheeled, Environmental consultant - 4 wheeled, Shower bench, and bed side commode   PLOF: Independent   PATIENT GOALS  To Regain the use of her left arm   OBJECTIVE:    HAND DOMINANCE: Right   ADLs: Overall ADLs: Husband assists pt. as needed Transfers/ambulation related to ADLs:Pt. Uses a 3 wheeled walker with Husband assist. Eating: Pt. Is independent with the right hand. Pt. has difficulty cutting food. Grooming: Pt. is using her right hand, however has difficulty sustaining her LUE in elevation to assist with haircare. UB Dressing: Pt.  Is independent donning a pullover shirt, and button down shirt. Has difficulty with buttoning, LB Dressing:  Independent donning pants, and socks. Difficulty tying shoes. Toileting: Independent Bathing: Pt. Is able to engage her right hand. Tub Shower transfers: Supervision Equipment: See above for equipment     IADLs: Shopping:  Has not had the opportunity for grocery shopping yet Light housekeeping: Husband is assisting with light house keeping Meal Prep:  Dependent Community mobility: Relies of family/friends Medication management: Husband assisting with weekly pillbox set-up, and administering medication. Financial management: TBD Handwriting: 75% legible   MOBILITY STATUS: Hx of falls   POSTURE COMMENTS:  No Significant postural limitations Sitting balance: supported sitting balance WFL   ACTIVITY TOLERANCE: Activity tolerance:  Fatigues in greater than 30 min.    FUNCTIONAL OUTCOME MEASURES: FOTO: 57   UPPER EXTREMITY ROM      Active ROM Right Eval: WFL Left eval  Shoulder flexion   132  Shoulder abduction   80  Shoulder adduction       Shoulder extension      Shoulder internal rotation      Shoulder external rotation      Elbow flexion   140  Elbow extension   WNL  Wrist flexion   65  Wrist extension   -10  Wrist ulnar deviation      Wrist radial deviation      Wrist pronation      Wrist supination      (Blank rows = not tested)   Left digit flexion to [facility]: 2nd: 0cm, 3rd: 0cm, 4th: 0cm, 5th: 0cm   Limited Left full 2nd digit extension     UPPER EXTREMITY MMT:      MMT Right Eval: 4+/5 overall Left eval  Shoulder flexion   3/5  Shoulder abduction   3-/5  Shoulder adduction      Shoulder extension      Shoulder internal rotation      Shoulder external rotation      Middle trapezius      Lower trapezius      Elbow flexion   3+/5  Elbow extension   3+/5  Wrist flexion      Wrist extension   2-/5  Wrist ulnar deviation      Wrist radial deviation      Wrist pronation      Wrist supination      (Blank rows = not tested)   HAND FUNCTION: Grip strength: Right: 26#, Left: 10# Pinch strength: Right 8#, Left: 3#, 3 Pt. Pinch strength: Right: 9#, L: 2#   COORDINATION: Right: 22 sec., Left: <5 min. To place 7 pegs with increased compensation proximally in the trunk, and through reflexive associated reactions.   SENSATION: Light touch: WFL, proprioceptive awareness: Intact   EDEMA: N/A   MUSCLE TONE: LUE: Hypotonic   COGNITION: Overall cognitive status: WFL for tasks assessed. Pt. Is impulsive at times.   VISION: Subjective report: Pt. report having shingles affecting left eye  s/p 1 year. Has nerve pain Baseline vision: Wears glasses for reading only Visual history: Report no changes   VISION ASSESSMENT:    WFL for tasks performed   PERCEPTION: Intact   PRAXIS: Impaired: Motor planning   OBSERVATIONS:  Pt. More alert, and engaging since prior to the most recent hospitalization.     TODAY'S TREATMENT:     Pt. Participated in an initial evaluation     PATIENT EDUCATION: Education  details: OT services, POC, and goals. Person  educated: Patient and Spouse Education method: Customer service manager Education comprehension: verbalized understanding, returned demonstration, and needs further education     HOME EXERCISE PROGRAM:   Assess ongoing need for HEP       GOALS: Goals reviewed with patient? Yes   SHORT TERM GOALS: Target date: 01/27/2023       1. Patient will be independent with home exercise program for the left upper extremity Baseline: No current home exercise program Goal status: INITIAL   2. Pt. Will improve active left 2nd digit extension to be able able to isolate her 2nd digit in preparation for pressing/pushing buttons on appliances, phones, or remotes. Baseline: Is unable to actively full digit extension Goal status: INITIAL     LONG TERM GOALS: Target date: 03/10/2023     Patient will improve left shoulder strength by 2 mm grades to be able to sustain UE's in elevation long enough to wash her hair.  Baseline: Eval: Left shoulder flexion: 3/5, abduction: 3-/5 Goal status: INITIAL   2.  Patient will improve left shoulder active abduction to be able to comb her hair Baseline: Eval: Left shoulder abduction is 80(108) Goal status: INITIAL   3.  Patient will independently button her shirt with modified independence. Baseline: Eval: Patient requires has difficulty.  Goal status: INITIAL   4.  Patient well improve left grip strength preparation  for securely holding flowers. Baseline: Eval: Pt. Is unable to securely hold flowers. Goal status: INITIAL   5.  Pt. will independently recall adaptive  strategies for performing ADL tasks including: flossing teeth, donning bra, applying makeup. Baseline: Eval: Pt. to be provided with adaptive strategies. Goal status: INITIAL   6.  Pt. will improve Foto score by 2 points to reflect patient perceived performance improvement assessment specific ADLs  and IADLs Baseline: Eval: 57 Goal status:  INITIAL  7.  Pt. will improve left hand coordination skills in order to be able to handle, and sort utensils in a drawer.     Baseline: Eval: Pt. has difficulty sorting, and placing utensils with the left hand. Left FMC : >5 min. For 7 pegs on the 9 hole peg test.    Goal Status: INITIAL       ASSESSMENT:   CLINICAL IMPRESSION:   Patient is a 82 y.o. Castillo who was seen today for occupational therapy evaluation for left upper extremity weakness following  hospital readmission for a 2nd CVA.  Patient's FOTO score is 45. Patient presents with left upper extremity weakness, decreased left upper extremity range of motion, impaired motor control, decreased coordination, and limited functional mobility which hinder her ability to complete daily ADL and IADL tasks.  Patient will benefit from OT services to work on improving left shoulder elbow and wrist strength, improve motor control and coordination in order to prepare the left upper extremity and hand for functional reaching, and engaging the left upper extremity when performing performing hair care, buttoning, securely holding flowers, cutting food, and manipulating/handling/sorting utensils from a drawer for setting a table.  Patient will benefit from adaptive and compensatory strategies during daily ADL and IADL care.   PERFORMANCE DEFICITS in functional skills including ADLs, IADLs, coordination, proprioception, ROM, strength, [place], and GMC, cognitive skills including memory, and psychosocial skills including coping strategies, environmental adaptation, interpersonal interactions, and routines and behaviors.    IMPAIRMENTS are limiting patient from ADLs, IADLs, education, leisure, and social participation.    COMORBIDITIES may have co-morbidities  that affects occupational performance. Patient will benefit from  skilled OT to address above impairments and improve overall function.   MODIFICATION OR ASSISTANCE TO COMPLETE EVALUATION: Min-Moderate  modification of tasks or assist with assess necessary to complete an evaluation.   OT OCCUPATIONAL PROFILE AND HISTORY: Detailed assessment: Review of records and additional review of physical, cognitive, psychosocial history related to current functional performance.   CLINICAL DECISION MAKING: Moderate - several treatment options, min-mod task modification necessary   REHAB POTENTIAL: Good   EVALUATION COMPLEXITY: Moderate      PLAN: OT FREQUENCY: 2x/week   OT DURATION: 12 weeks   PLANNED INTERVENTIONS: self care/ADL training, therapeutic exercise, therapeutic activity, neuromuscular re-education, manual therapy, passive range of motion, functional mobility training, electrical stimulation, and paraffin   RECOMMENDED OTHER SERVICES: PT   CONSULTED AND AGREED WITH PLAN OF CARE: Patient and family member/caregiver   PLAN FOR NEXT SESSION: Initiate OT treatment  Harrel Carina, MS, OTR/L   Harrel Carina, OT 12/16/2022, 6:22 PM

## 2022-12-17 DIAGNOSIS — E781 Pure hyperglyceridemia: Secondary | ICD-10-CM | POA: Diagnosis not present

## 2022-12-17 DIAGNOSIS — R739 Hyperglycemia, unspecified: Secondary | ICD-10-CM | POA: Diagnosis not present

## 2022-12-17 DIAGNOSIS — E039 Hypothyroidism, unspecified: Secondary | ICD-10-CM | POA: Diagnosis not present

## 2022-12-17 DIAGNOSIS — I10 Essential (primary) hypertension: Secondary | ICD-10-CM | POA: Diagnosis not present

## 2022-12-17 DIAGNOSIS — Z8673 Personal history of transient ischemic attack (TIA), and cerebral infarction without residual deficits: Secondary | ICD-10-CM | POA: Diagnosis not present

## 2022-12-17 DIAGNOSIS — R7303 Prediabetes: Secondary | ICD-10-CM | POA: Insufficient documentation

## 2022-12-18 ENCOUNTER — Encounter: Payer: Medicare HMO | Admitting: Speech Pathology

## 2022-12-18 ENCOUNTER — Ambulatory Visit: Payer: Medicare HMO

## 2022-12-18 ENCOUNTER — Ambulatory Visit: Payer: Medicare HMO | Admitting: Occupational Therapy

## 2022-12-18 DIAGNOSIS — R278 Other lack of coordination: Secondary | ICD-10-CM

## 2022-12-18 DIAGNOSIS — M6281 Muscle weakness (generalized): Secondary | ICD-10-CM

## 2022-12-18 NOTE — Therapy (Signed)
OUTPATIENT OCCUPATIONAL THERAPY NEURO TREATMENT  Patient Name: [name] MRN: [id number] DOB:[birth year], 82 y.o., female Today's Date: 12/18/2022  PCP: Dr. Doy Hutching REFERRING PROVIDER:  Dr. Doy Hutching  END OF SESSION:  OT End of Session - 12/18/22 2328     Visit Number 2    Number of Visits 24    Date for OT Re-Evaluation 03/10/23    Authorization Time Period Progress report period starting 09/09/2022    OT Start Time 35    OT Stop Time 1645    OT Time Calculation (min) 45 min    Activity Tolerance Patient limited by fatigue    Behavior During Therapy WFL for tasks assessed/performed             Past Medical History:  Diagnosis Date   Anemia    Arthritis    Asthma    uses inhaler just prior to surgery to avoid attack   Back pain    from previous injury   Complication of anesthesia    has woken  up during 2 different surgery   Depression    no current issue/treatment; situation   Gallstones    GERD (gastroesophageal reflux disease)    Hiatal hernia    patient does NOT have nerve/muscle disease   History of kidney stones    HLD (hyperlipidemia)    HTN (hypertension)    Hypothyroidism    Kidney stones    Knee pain    Non-diabetic pancreatic hormone dysfunction years   pt. 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   Pancreatitis    Pneumonia    Seizures (HCC)    caused by dye injected during a procedure   Shortness of breath    with exertion   Sinus problem    frequent infections/congestion   Stroke ([place]) 2021   reports having CVA in 2021 and having mini strokes before that   Thyroid disease    Past Surgical History:  Procedure Laterality Date   ABDOMINAL HYSTERECTOMY     APPENDECTOMY     CARPAL TUNNEL RELEASE  10+ years ago   bilateral   EYE SURGERY  3 yrs ago   bilateral cataracts   FOOT OSTEOTOMY  6 weeks ago   Left foot: great, 2nd & 3rd   FOOT OSTEOTOMY  5 years ago   Right great toe   HAND SURGERY Bilateral 2011-most recent   multiple hand  surgeries, 2 on left, 3 on right   KNEE ARTHROPLASTY Right 04/28/2022   Procedure: COMPUTER ASSISTED TOTAL KNEE ARTHROPLASTY;  Surgeon: Dereck Leep, MD;  Location: [facility];  Service: Orthopedics;  Laterality: Right;   LOOP RECORDER INSERTION N/A 05/16/2020   Procedure: LOOP RECORDER INSERTION;  Surgeon: Isaias Cowman, MD;  Location: [facility];  Service: Cardiovascular;  Laterality: N/A;   NASAL SINUS SURGERY  most recent 7-8 yrs ago   7 sinus surgeries    TRIGGER FINGER RELEASE  11/19/2011   Procedure: RELEASE TRIGGER FINGER/A-1 PULLEY;  Surgeon: Wynonia Sours, MD;  Location: [facility];  Service: Orthopedics;  Laterality: Right;  release a-1 pulley right index finger and cyst removal   WRIST GANGLION EXCISION  1980's   right   Patient Active Problem List   Diagnosis Date Noted   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 11/27/2022   Hypomagnesemia 11/27/2022   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   HLD (hyperlipidemia) 11/23/2022   Asthma 11/23/2022   Depression with anxiety 11/23/2022   Chronic diastolic  CHF (congestive heart failure) ([place]) 11/23/2022   Normocytic anemia 11/23/2022   Aspiration pneumonia ([place]) 11/23/2022   AKI (acute kidney injury) ([place]) 11/23/2022   Abdominal pain 11/23/2022   Mesenteric mass 11/23/2022   Coffee ground emesis 11/23/2022   Nausea and vomiting 10/15/2022   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   Fatigue 09/17/2022   Left hemiparesis ([place]) 09/17/2022   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[place]) 08/22/2022   GERD (gastroesophageal reflux disease) 08/21/2022   Agitation 08/20/2022   Acute left-sided weakness 08/20/2022   Expressive aphasia    Stroke ([place]) 08/19/2022   Leukocytosis 08/19/2022   History of urticaria 04/28/2022   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   Primary osteoarthritis of left knee 02/24/2022   Primary osteoarthritis of right knee 02/24/2022   Lumbar spondylolysis 04/12/2020    History of CVA (cerebrovascular accident) 03/26/2020   Low back pain radiating to right lower extremity 03/21/2020   B12 deficiency 03/06/2020   Positive anti-CCP test 12/21/2019   Arthralgia 12/13/2019   Dermatitis 12/13/2019   Rheumatoid factor positive 12/13/2019   Essential hypertension 12/11/2018   Palpitations 12/11/2018   Acquired hypothyroidism 11/10/2018   Arthritis of knee 09/17/2016   Anxiety 11/22/2014   Asthma without status asthmaticus 11/22/2014   Benign neoplasm of colon, unspecified 11/22/2014   Environmental allergies 11/22/2014   Hypertriglyceridemia 11/22/2014   Hypokalemia 11/22/2014   Personal history of disease of skin and subcutaneous tissue 11/22/2014   REFERRING DIAG: CVA   THERAPY DIAG:  Muscle weakness (generalized)   Other lack of coordination   Rationale for Evaluation and Treatment Rehabilitation   SUBJECTIVE:    SUBJECTIVE STATEMENT:  Pt. Had a fall 3 days after leaving the hospital.   Pt accompanied by: significant other   PERTINENT HISTORY: Patient is an 82 year-old female who was [facility] with a cerebellar CVA. 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 Pt. Was admitted to Inpatient Rehab from 12/21-1/02/2023. 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  Patient underwent inpatient rehabilitation for 2 weeks.  Patient was assessed and was scheduled for a knee replacement on 08/29/2022 however had to cancel it due to having had a CVA.  Patient had a recent fall 2 days after discharging from inpatient rehab. Past Medical History includes: Knee replacement, essential HTN, hypokalemia, leukocytosis, seizures, positive anti-- CCP test, anxiety disorder, mini strokes.  Patient had shingles with left eye nerve pain s/p 1 year ago.    PRECAUTIONS: Fall   WEIGHT BEARING RESTRICTIONS No   PAIN:  Are you having pain? left elbow/shoulder  initially improved with ROM: NR   FALLS: Has patient fallen in last 6 months? Yes. Number of falls 1   LIVING ENVIRONMENT: Lives with: Lives with Spouse Lives in: House/apartment Stairs: 2 storey home, resides on the first floor.  External: 2 stairs front no rails, and 6 in back with rails Has following equipment at home: Single point cane, Walker - 2 wheeled, Environmental consultant - 4 wheeled, Shower bench, and bed side commode   PLOF: Independent   PATIENT GOALS  To Regain the use of her left arm   OBJECTIVE:    HAND DOMINANCE: Right   ADLs: Overall ADLs: Husband assists pt. as needed Transfers/ambulation related to ADLs:Pt. Uses a 3 wheeled walker with Husband assist. Eating: Pt. Is independent with the right hand. Pt. has difficulty cutting food. Grooming: Pt. is using her right hand, however has difficulty sustaining her LUE in elevation to  assist with haircare. UB Dressing: Pt. Is independent donning a pullover shirt, and button down shirt. Has difficulty with buttoning, LB Dressing:  Independent donning pants, and socks. Difficulty tying shoes. Toileting: Independent Bathing: Pt. Is able to engage her right hand. Tub Shower transfers: Supervision Equipment: See above for equipment     IADLs: Shopping:  Has not had the opportunity for grocery shopping yet Light housekeeping: Husband is assisting with light house keeping Meal Prep:  Dependent Community mobility: Relies of family/friends Medication management: Husband assisting with weekly pillbox set-up, and administering medication. Financial management: TBD Handwriting: 75% legible   MOBILITY STATUS: Hx of falls   POSTURE COMMENTS:  No Significant postural limitations Sitting balance: supported sitting balance WFL   ACTIVITY TOLERANCE: Activity tolerance:  Fatigues in greater than 30 min.    FUNCTIONAL OUTCOME MEASURES: FOTO: 57   UPPER EXTREMITY ROM      Active ROM Right Eval: WFL Left eval  Shoulder flexion   132   Shoulder abduction   80  Shoulder adduction      Shoulder extension      Shoulder internal rotation      Shoulder external rotation      Elbow flexion   140  Elbow extension   WNL  Wrist flexion   65  Wrist extension   -10  Wrist ulnar deviation      Wrist radial deviation      Wrist pronation      Wrist supination      (Blank rows = not tested)   Left digit flexion to [facility]: 2nd: 0cm, 3rd: 0cm, 4th: 0cm, 5th: 0cm   Limited Left full 2nd digit extension     UPPER EXTREMITY MMT:      MMT Right Eval: 4+/5 overall Left eval  Shoulder flexion   3/5  Shoulder abduction   3-/5  Shoulder adduction      Shoulder extension      Shoulder internal rotation      Shoulder external rotation      Middle trapezius      Lower trapezius      Elbow flexion   3+/5  Elbow extension   3+/5  Wrist flexion      Wrist extension   2-/5  Wrist ulnar deviation      Wrist radial deviation      Wrist pronation      Wrist supination      (Blank rows = not tested)   HAND FUNCTION: Grip strength: Right: 26#, Left: 10# Pinch strength: Right 8#, Left: 3#, 3 Pt. Pinch strength: Right: 9#, L: 2#   COORDINATION: Right: 22 sec., Left: <5 min. To place 7 pegs with increased compensation proximally in the trunk, and through reflexive associated reactions.   SENSATION: Light touch: WFL, proprioceptive awareness: Intact   EDEMA: N/A   MUSCLE TONE: LUE: Hypotonic   COGNITION: Overall cognitive status: WFL for tasks assessed. Pt. Is impulsive at times.   VISION: Subjective report: Pt. report having shingles affecting left eye  s/p 1 year. Has nerve pain Baseline vision: Wears glasses for reading only Visual history:  updated see clinical impression   VISION ASSESSMENT:    WFL for tasks performed   PERCEPTION: Intact   PRAXIS: Impaired: Motor planning   OBSERVATIONS:  Pt. More alert, and engaging since prior to the most recent hospitalization.     TODAY'S TREATMENT:    Ther. Ex.:    Pt. tolerated AAROM, and PROM for left shoulder flexion,  and abduction. Pt. performed reps digit extension, and thumb IP extension with her hand positioned flat at the tabletop surface.   Neuromuscular re-education:   Pt. worked on grasping 1" resistive cubes alternating thumb opposition to the tip of the 2nd digit while the board is placed at a vertical angle. Pt. worked on pressing the cubes back into place while alternating isolated 2nd digit extension. Pt. worked on isolated 2nd digit extension. Pt. worked on isolating the 2nd digit with full digit PIP extension while flexing the 3rd, 4th, and 5th digits.  Pt. Worked on grasping the cubes from the far right, and left, and was able to grasp them accurately with each rep.         PATIENT EDUCATION: Education details: left hand digit extension Person educated: Patient and Spouse Education method: Customer service manager Education comprehension: verbalized understanding, returned demonstration, and needs further education     HOME EXERCISE PROGRAM:   Assess ongoing need for HEP       GOALS: Goals reviewed with patient? Yes   SHORT TERM GOALS: Target date: 01/27/2023       1. Patient will be independent with home exercise program for the left upper extremity Baseline: No current home exercise program Goal status: INITIAL   2. Pt. Will improve active left 2nd digit extension to be able able to isolate her 2nd digit in preparation for pressing/pushing buttons on appliances, phones, or remotes. Baseline: Is unable to actively full digit extension Goal status: INITIAL     LONG TERM GOALS: Target date: 03/10/2023     Patient will improve left shoulder strength by 2 mm grades to be able to sustain UE's in elevation long enough to wash her hair.  Baseline: Eval: Left shoulder flexion: 3/5, abduction: 3-/5 Goal status: INITIAL   2.  Patient will improve left shoulder active abduction to be able to comb her hair Baseline: Eval:  Left shoulder abduction is 80(108) Goal status: INITIAL   3.  Patient will independently button her shirt with modified independence. Baseline: Eval: Patient requires has difficulty.  Goal status: INITIAL   4.  Patient well improve left grip strength preparation  for securely holding flowers. Baseline: Eval: Pt. Is unable to securely hold flowers. Goal status: INITIAL   5.  Pt. will independently recall adaptive  strategies for performing ADL tasks including: flossing teeth, donning bra, applying makeup. Baseline: Eval: Pt. to be provided with adaptive strategies. Goal status: INITIAL   6.  Pt. will improve Foto score by 2 points to reflect patient perceived performance improvement assessment specific ADLs  and IADLs Baseline: Eval: 57 Goal status: INITIAL  7.  Pt. will improve left hand coordination skills in order to be able to handle, and sort utensils in a drawer.     Baseline: Eval: Pt. has difficulty sorting, and placing utensils with the left hand. Left FMC : >5 min. For 7 pegs on the 9 hole peg test.    Goal Status: INITIAL       ASSESSMENT:   CLINICAL IMPRESSION:   Patient reports having distorted vision all the time since her most recent stroke. Pt. Reports the she did not want to mention it to anybody because she thought, and hoped that it would just go away. Pt. reports being unable to see details on the TV. Vision to be further assessed in functional context to determine how it may be affecting ADLs, and IADL tasks. Pt./caregiver were encouraged to have Pt. Contact, and follow-up with her  eye care physician for a reassessment 2/2 changes since the most recent CVA. Pt. Presents with limited 2nd digit extension, 2/2 flexor tone/tightness. Pt. And husband were assisted in problem solving through activities, and tasks at home the focus on isolating the 2nd digit.  Patient continues to work on improving left shoulder elbow and wrist strength, improving motor control and  coordination in order to prepare the left upper extremity and hand for functional reaching, and engaging the left upper extremity when performing performing hair care, buttoning, securely holding flowers, cutting food, and manipulating/handling/sorting utensils from a drawer for setting a table,as well as adaptive and compensatory strategies during daily ADL and IADL care.   PERFORMANCE DEFICITS in functional skills including ADLs, IADLs, coordination, proprioception, ROM, strength, [place], and GMC, cognitive skills including memory, and psychosocial skills including coping strategies, environmental adaptation, interpersonal interactions, and routines and behaviors.    IMPAIRMENTS are limiting patient from ADLs, IADLs, education, leisure, and social participation.    COMORBIDITIES may have co-morbidities  that affects occupational performance. Patient will benefit from skilled OT to address above impairments and improve overall function.   MODIFICATION OR ASSISTANCE TO COMPLETE EVALUATION: Min-Moderate modification of tasks or assist with assess necessary to complete an evaluation.   OT OCCUPATIONAL PROFILE AND HISTORY: Detailed assessment: Review of records and additional review of physical, cognitive, psychosocial history related to current functional performance.   CLINICAL DECISION MAKING: Moderate - several treatment options, min-mod task modification necessary   REHAB POTENTIAL: Good   EVALUATION COMPLEXITY: Moderate      PLAN: OT FREQUENCY: 2x/week   OT DURATION: 12 weeks   PLANNED INTERVENTIONS: self care/ADL training, therapeutic exercise, therapeutic activity, neuromuscular re-education, manual therapy, passive range of motion, functional mobility training, electrical stimulation, and paraffin   RECOMMENDED OTHER SERVICES: PT   CONSULTED AND AGREED WITH PLAN OF CARE: Patient and family member/caregiver   PLAN FOR NEXT SESSION: Initiate OT treatment  Harrel Carina, MS,  OTR/L   Harrel Carina, OT 12/18/2022, 11:37 PM

## 2022-12-19 ENCOUNTER — Encounter: Payer: Medicare HMO | Attending: Physical Medicine and Rehabilitation | Admitting: Physical Medicine & Rehabilitation

## 2022-12-19 ENCOUNTER — Encounter: Payer: Self-pay | Admitting: Physical Medicine & Rehabilitation

## 2022-12-19 VITALS — BP 134/74 | HR 71 | Ht 59.0 in

## 2022-12-19 DIAGNOSIS — I69354 Hemiplegia and hemiparesis following cerebral infarction affecting left non-dominant side: Secondary | ICD-10-CM

## 2022-12-19 DIAGNOSIS — I69398 Other sequelae of cerebral infarction: Secondary | ICD-10-CM | POA: Insufficient documentation

## 2022-12-19 DIAGNOSIS — R269 Unspecified abnormalities of gait and mobility: Secondary | ICD-10-CM

## 2022-12-19 NOTE — Progress Notes (Signed)
Subjective:    Patient ID: [name], female    DOB: 11-Jun-1941, 82 y.o.   MRN: [id number]  82 y.o. right-handed female with history of recent stroke with left-sided weakness maintained on aspirin and Plavix with loop recorder placement received inpatient rehab services 08/22/2022 - 09/05/2022 discharge to home ambulating supervision level, hypertension hyperlipidemia, pancreatitis, chronic anemia, diastolic congestive heart failure.  Per chart review lives with spouse.  Presented to [facility] 11/23/2022 with increasing left-sided weakness nausea vomiting nonspecific abdominal pain.  Cranial CT scan negative for acute changes.  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  New compared to 08/19/2022.  No other intracranial arterial occlusion or high-grade stenosis.  Patient did not receive tPA.  MRI of the brain showed extensive acute nonhemorrhagic infarct involving the posterior inferior cerebellum bilaterally right greater than left.  A 9 mm right occipital pole infarction.  Punctate white matter infarct in the left occipital lobe.  Additional punctate cortical infarct in the medial right occipital lobe more superiorly.  Echocardiogram with ejection fraction of 55 to 60% no wall motion abnormalities grade 1 diastolic dysfunction.  CT abdomen pelvis showed 8.4 cm masslike inflammatory edematous changes in the anterior pelvic fat cephalad to the urinary bladder possibly contusion versus neoplasm.  Admission chemistries unremarkable except glucose 179 creatinine 1.79 urine drug screen negative.  Neurology follow-up initially placed on intravenous heparin currently remained on aspirin and Plavix 21 days then Plavix alone for CVA prophylaxis.  Lovenox added for DVT prophylaxis.  Gastroenterology services Dr. Virgina Jock in regards to coffee-ground emesis with CT abdomen pelvis reviewed not felt to be GI bleed consider CTA if  worsening but no plan for endoscopy or further workup at this time.  Patient was cleared to continue aspirin and Plavix therapy.  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  AKI improved with gentle IV fluids latest creatinine 1.28.  Question aspiration pneumonia initially on Unasyn transition to Augmentin finishing course.  Cardiology interrogated loop recorder no events noted.  Therapy evaluations completed due to patient decreased functional ability left-sided weakness was admitted for a comprehensive rehab program.   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 She underwent inpatient rehab program and made good functional gains. She has returned home where she is living with her husband. She continues to have some visual illusions.  Less so than at the hospital however sometimes could not recognize the face of a well-known news personality that she recognized by voice. We discussed the location of her stroke and implication of visual cortex stroke as well as the balance issues that she is experiencing as it pertains to her cerebellar stroke. The patient has been fairly independent at home she is not driving she is able to climb steps she uses a walker for ambulation.  She reports 3 stools per week with normal bladder function.  She lives in a two-level home with 2 steps to enter.  She has no accessibility issues with her bathroom. The patient denies falls at home.  Her husband brought her to the office today.  He has no  concerns to discuss other than above.  The patient has seen her primary care physician  Pain Inventory Average Pain 0 Pain Right Now  0 My pain is  no pain  LOCATION OF PAIN  weakness in left arm, weakness left hand  BOWEL Number of stools per week: 3-5 Oral laxative use No    BLADDER Normal    Mobility walk with assistance use a walker how many minutes can you walk? 3 ability to climb steps?  yes do you drive?  no transfers alone Do you have any goals in this area?  yes  Function retired I need assistance with the following:  dressing, bathing, meal prep, household duties, and shopping Do you have any goals in this area?  yes  Neuro/Psych weakness trouble walking  Prior Studies Any changes since last visit?  no  Physicians involved in your care Any changes since last visit?  no   Family History  Problem Relation Age of Onset   Anesthesia problems Father        "bad lungs" couldn't wake him up   Heart disease Father    Heart attack Father 43   Breast cancer Maternal Grandmother    Breast cancer Maternal Aunt        x 2   Breast cancer Cousin    Pancreatic cancer Cousin    Heart attack Paternal Uncle    Stroke Paternal Grandfather    Social History   Socioeconomic History   Marital status: Married    Spouse name: [name]   Number of children: 1   Years of education: Not on file   Highest education level: Not on file  Occupational History   Occupation: retired  Tobacco Use   Smoking status: Never   Smokeless tobacco: Never  Vaping Use   Vaping Use: Never used  Substance and Sexual Activity   Alcohol use: Not Currently    Alcohol/week: 0.0 - 1.0 standard drinks of alcohol    Comment: wine once every 2-3 months   Drug use: Never   Sexual activity: Not on file  Other Topics Concern   Not on file  Social History Narrative   Not on file   Social Determinants of Health   Financial Resource Strain: Not on file  Food Insecurity: No Food Insecurity (11/23/2022)   Hunger Vital Sign    Worried About Running Out of Food in the Last Year: Never true    [place] in the Last  Year: Never true  Transportation Needs: No Transportation Needs (11/23/2022)   PRAPARE - Hydrologist (Medical): No    Lack of Transportation (Non-Medical): No  Physical Activity: Not on file  Stress: Not on file  Social Connections: Not on file   Past Surgical History:  Procedure Laterality Date   ABDOMINAL HYSTERECTOMY     APPENDECTOMY     CARPAL TUNNEL RELEASE  10+ years ago   bilateral   EYE SURGERY  3 yrs ago   bilateral cataracts   FOOT OSTEOTOMY  6 weeks ago   Left foot: great, 2nd & 3rd   FOOT OSTEOTOMY  5 years ago   Right great toe   HAND SURGERY Bilateral 2011-most recent   multiple hand surgeries, 2 on left, 3 on right   KNEE ARTHROPLASTY Right 04/28/2022   Procedure: COMPUTER ASSISTED TOTAL KNEE ARTHROPLASTY;  Surgeon: Dereck Leep, MD;  Location: [facility];  Service: Orthopedics;  Laterality: Right;   LOOP RECORDER  INSERTION N/A 05/16/2020   Procedure: LOOP RECORDER INSERTION;  Surgeon: Isaias Cowman, MD;  Location: [facility];  Service: Cardiovascular;  Laterality: N/A;   NASAL SINUS SURGERY  most recent 7-8 yrs ago   7 sinus surgeries    TRIGGER FINGER RELEASE  11/19/2011   Procedure: RELEASE TRIGGER FINGER/A-1 PULLEY;  Surgeon: Wynonia Sours, MD;  Location: [place];  Service: Orthopedics;  Laterality: Right;  release a-1 pulley right index finger and cyst removal   WRIST GANGLION EXCISION  1980's   right   Past Medical History:  Diagnosis Date   Anemia    Arthritis    Asthma    uses inhaler just prior to surgery to avoid attack   Back pain    from previous injury   Complication of anesthesia    has woken  up during 2 different surgery   Depression    no current issue/treatment; situation   Gallstones    GERD (gastroesophageal reflux disease)    Hiatal hernia    patient does NOT have nerve/muscle disease   History of kidney stones    HLD (hyperlipidemia)    HTN (hypertension)     Hypothyroidism    Kidney stones    Knee pain    Non-diabetic pancreatic hormone dysfunction years   pt. 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   Pancreatitis    Pneumonia    Seizures ([place])    caused by dye injected during a procedure   Shortness of breath    with exertion   Sinus problem    frequent infections/congestion   Stroke ([employer]) 2021   reports having CVA in 2021 and having mini strokes before that   Thyroid disease    There were no vitals taken for this visit.  Opioid Risk Score:   Fall Risk Score:  `1  Depression screen [facility] 2/9     10/15/2022    1:02 PM 09/17/2022    1:49 PM  Depression screen PHQ 2/9  Decreased Interest 0 0  Down, Depressed, Hopeless 0 0  PHQ - 2 Score 0 0  Altered sleeping  3  Tired, decreased energy  3  Change in appetite  0  Feeling bad or failure about yourself   0  Trouble concentrating  0  Moving slowly or fidgety/restless  0  Suicidal thoughts  0  PHQ-9 Score  6  Difficult doing work/chores  Somewhat difficult    Review of Systems  Eyes:  Positive for visual disturbance.  Musculoskeletal:  Positive for gait problem.  Neurological:  Positive for weakness.      Objective:   Physical Exam General no acute distress Mood and affect are appropriate Speech without evidence of dysarthria or aphasia No evidence of nystagmus Visual fields are intact confrontation testing No evidence of nystagmus Motor strength is 5/5 right and 4 -/5 left deltoid bicep tricep grip hip flexion knee extension ankle dorsiflexion Patient has incomplete extension of the right index finger.  It can be straightened with only minor stretching. Tone is otherwise normal in the upper and lower limbs. Ambulation not tested, patient did not have her walker along.       Assessment & Plan:   1.  Cerebellar infarcts as well as right occipital infarct.  She still having problems with balance disorder.  She had a prior stroke right subcortical causing residual  left-sided weakness from the stroke in September.  She does have some tone issues in regards to the  right index finger flexion Overall she is making good recovery.  Would not resume driving at this time. Continue outpatient therapy Follow-up in 6 weeks.  If finger flexor tone on the left is no better may consider botulinum toxin injection.

## 2022-12-23 ENCOUNTER — Encounter: Payer: Medicare HMO | Admitting: Speech Pathology

## 2022-12-23 ENCOUNTER — Ambulatory Visit: Payer: Medicare HMO

## 2022-12-23 ENCOUNTER — Ambulatory Visit: Payer: Medicare HMO | Admitting: Occupational Therapy

## 2022-12-25 ENCOUNTER — Ambulatory Visit: Payer: Medicare HMO | Admitting: Occupational Therapy

## 2022-12-25 ENCOUNTER — Encounter: Payer: Medicare HMO | Admitting: Speech Pathology

## 2022-12-25 ENCOUNTER — Ambulatory Visit: Payer: Medicare HMO

## 2022-12-29 ENCOUNTER — Other Ambulatory Visit: Payer: Self-pay | Admitting: Physical Medicine and Rehabilitation

## 2022-12-30 ENCOUNTER — Encounter: Payer: Medicare HMO | Admitting: Speech Pathology

## 2022-12-30 ENCOUNTER — Ambulatory Visit: Payer: Medicare HMO | Admitting: Occupational Therapy

## 2022-12-30 ENCOUNTER — Ambulatory Visit: Payer: Medicare HMO

## 2022-12-30 DIAGNOSIS — R278 Other lack of coordination: Secondary | ICD-10-CM | POA: Diagnosis not present

## 2022-12-30 DIAGNOSIS — M6281 Muscle weakness (generalized): Secondary | ICD-10-CM | POA: Diagnosis not present

## 2022-12-31 ENCOUNTER — Other Ambulatory Visit: Payer: Self-pay | Admitting: Physical Medicine and Rehabilitation

## 2022-12-31 ENCOUNTER — Encounter: Payer: Self-pay | Admitting: Occupational Therapy

## 2022-12-31 DIAGNOSIS — E039 Hypothyroidism, unspecified: Secondary | ICD-10-CM | POA: Diagnosis not present

## 2022-12-31 DIAGNOSIS — R7303 Prediabetes: Secondary | ICD-10-CM | POA: Diagnosis not present

## 2022-12-31 DIAGNOSIS — Z8673 Personal history of transient ischemic attack (TIA), and cerebral infarction without residual deficits: Secondary | ICD-10-CM | POA: Diagnosis not present

## 2022-12-31 DIAGNOSIS — I10 Essential (primary) hypertension: Secondary | ICD-10-CM | POA: Diagnosis not present

## 2022-12-31 DIAGNOSIS — Z79899 Other long term (current) drug therapy: Secondary | ICD-10-CM | POA: Diagnosis not present

## 2022-12-31 DIAGNOSIS — E781 Pure hyperglyceridemia: Secondary | ICD-10-CM | POA: Diagnosis not present

## 2022-12-31 NOTE — Therapy (Signed)
OUTPATIENT OCCUPATIONAL THERAPY NEURO TREATMENT  Patient Name: [name] MRN: [id number] DOB:June 09, 1941, 82 y.o., female Today's Date: 12/31/2022  PCP: Dr. Doy Hutching REFERRING PROVIDER:  Dr. Doy Hutching  END OF SESSION:  OT End of Session - 12/31/22 0841     Visit Number 3    Number of Visits 24    Date for OT Re-Evaluation 03/10/23    Authorization Time Period Progress report period starting 09/09/2022    OT Start Time 1648    OT Stop Time 1730    OT Time Calculation (min) 42 min    Activity Tolerance Patient limited by fatigue    Behavior During Therapy [facility] for tasks assessed/performed             Past Medical History:  Diagnosis Date   Anemia    Arthritis    Asthma    uses inhaler just prior to surgery to avoid attack   Back pain    from previous injury   Complication of anesthesia    has woken  up during 2 different surgery   Depression    no current issue/treatment; situation   Gallstones    GERD (gastroesophageal reflux disease)    Hiatal hernia    patient does NOT have nerve/muscle disease   History of kidney stones    HLD (hyperlipidemia)    HTN (hypertension)    Hypothyroidism    Kidney stones    Knee pain    Nausea and vomiting 10/15/2022   Non-diabetic pancreatic hormone dysfunction years   pt. 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   Pancreatitis    Pneumonia    Seizures (HCC)    caused by dye injected during a procedure   Shortness of breath    with exertion   Sinus problem    frequent infections/congestion   Stroke ([place]) 2021   reports having CVA in 2021 and having mini strokes before that   Thyroid disease    Past Surgical History:  Procedure Laterality Date   ABDOMINAL HYSTERECTOMY     APPENDECTOMY     CARPAL TUNNEL RELEASE  10+ years ago   bilateral   EYE SURGERY  3 yrs ago   bilateral cataracts   FOOT OSTEOTOMY  6 weeks ago   Left foot: great, 2nd & 3rd   FOOT OSTEOTOMY  5 years ago   Right great toe   HAND SURGERY Bilateral  2011-most recent   multiple hand surgeries, 2 on left, 3 on right   KNEE ARTHROPLASTY Right 04/28/2022   Procedure: COMPUTER ASSISTED TOTAL KNEE ARTHROPLASTY;  Surgeon: Dereck Leep, MD;  Location: [facility];  Service: Orthopedics;  Laterality: Right;   LOOP RECORDER INSERTION N/A 05/16/2020   Procedure: LOOP RECORDER INSERTION;  Surgeon: Isaias Cowman, MD;  Location: [facility];  Service: Cardiovascular;  Laterality: N/A;   NASAL SINUS SURGERY  most recent 7-8 yrs ago   7 sinus surgeries    TRIGGER FINGER RELEASE  11/19/2011   Procedure: RELEASE TRIGGER FINGER/A-1 PULLEY;  Surgeon: Wynonia Sours, MD;  Location: [facility];  Service: Orthopedics;  Laterality: Right;  release a-1 pulley right index finger and cyst removal   WRIST GANGLION EXCISION  1980's   right   Patient Active Problem List   Diagnosis Date Noted   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 11/27/2022   Hypomagnesemia 11/27/2022   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   HLD (hyperlipidemia) 11/23/2022   Asthma 11/23/2022   Depression with  anxiety 11/23/2022   Chronic diastolic CHF (congestive heart failure) ([place]) 11/23/2022   Normocytic anemia 11/23/2022   Aspiration pneumonia ([place]) 11/23/2022   AKI (acute kidney injury) ([place]) 11/23/2022   Abdominal pain 11/23/2022   Mesenteric mass 11/23/2022   Coffee ground emesis 11/23/2022   Nausea and vomiting 10/15/2022   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   Fatigue 09/17/2022   Left hemiparesis ([place]) 09/17/2022   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[place]) 08/22/2022   GERD (gastroesophageal reflux disease) 08/21/2022   Agitation 08/20/2022   Acute left-sided weakness 08/20/2022   Expressive aphasia    Stroke ([place]) 08/19/2022   Leukocytosis 08/19/2022   History of urticaria 04/28/2022   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   Primary osteoarthritis of left knee 02/24/2022   Primary osteoarthritis of right knee 02/24/2022    Lumbar spondylolysis 04/12/2020   History of CVA (cerebrovascular accident) 03/26/2020   Low back pain radiating to right lower extremity 03/21/2020   B12 deficiency 03/06/2020   Positive anti-CCP test 12/21/2019   Arthralgia 12/13/2019   Dermatitis 12/13/2019   Rheumatoid factor positive 12/13/2019   Essential hypertension 12/11/2018   Palpitations 12/11/2018   Acquired hypothyroidism 11/10/2018   Arthritis of knee 09/17/2016   Anxiety 11/22/2014   Asthma without status asthmaticus 11/22/2014   Benign neoplasm of colon, unspecified 11/22/2014   Environmental allergies 11/22/2014   Hypertriglyceridemia 11/22/2014   Hypokalemia 11/22/2014   Personal history of disease of skin and subcutaneous tissue 11/22/2014   REFERRING DIAG: CVA   THERAPY DIAG:  Muscle weakness (generalized)   Other lack of coordination   Rationale for Evaluation and Treatment Rehabilitation   SUBJECTIVE:    SUBJECTIVE STATEMENT:  Pt. Had a fall 3 days after leaving the hospital.   Pt accompanied by: significant other   PERTINENT HISTORY: Patient is an 82 year-old female who was admitted to [facility] on 11/23/2022 with a cerebellar CVA. 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 Pt. Was admitted to Inpatient Rehab from 12/21-1/02/2023. 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  Patient underwent inpatient rehabilitation for 2 weeks.  Patient was assessed and was scheduled for a knee replacement on 08/29/2022 however had to cancel it due to having had a CVA.  Patient had a recent fall 2 days after discharging from inpatient rehab. Past Medical History includes: Knee replacement, essential HTN, hypokalemia, leukocytosis, seizures, positive anti-- CCP test, anxiety disorder, mini strokes.  Patient had shingles with left eye nerve pain s/p 1 year ago.    PRECAUTIONS: Fall   WEIGHT BEARING RESTRICTIONS No   PAIN:  Are you having  pain? left elbow/shoulder initially improved with ROM: NR   FALLS: Has patient fallen in last 6 months? Yes. Number of falls 1   LIVING ENVIRONMENT: Lives with: Lives with Spouse Lives in: House/apartment Stairs: 2 storey home, resides on the first floor.  External: 2 stairs front no rails, and 6 in back with rails Has following equipment at home: Single point cane, Walker - 2 wheeled, Environmental consultant - 4 wheeled, Shower bench, and bed side commode   PLOF: Independent   PATIENT GOALS  To Regain the use of her left arm   OBJECTIVE:    HAND DOMINANCE: Right   ADLs: Overall ADLs: Husband assists pt. as needed Transfers/ambulation related to ADLs:Pt. Uses a 3 wheeled walker with Husband assist. Eating: Pt. Is independent with the right hand. Pt. has difficulty cutting food. Grooming: Pt. is using her right hand, however has difficulty  sustaining her LUE in elevation to assist with haircare. UB Dressing: Pt. Is independent donning a pullover shirt, and button down shirt. Has difficulty with buttoning, LB Dressing:  Independent donning pants, and socks. Difficulty tying shoes. Toileting: Independent Bathing: Pt. Is able to engage her right hand. Tub Shower transfers: Supervision Equipment: See above for equipment     IADLs: Shopping:  Has not had the opportunity for grocery shopping yet Light housekeeping: Husband is assisting with light house keeping Meal Prep:  Dependent Community mobility: Relies of family/friends Medication management: Husband assisting with weekly pillbox set-up, and administering medication. Financial management: TBD Handwriting: 75% legible   MOBILITY STATUS: Hx of falls   POSTURE COMMENTS:  No Significant postural limitations Sitting balance: supported sitting balance WFL   ACTIVITY TOLERANCE: Activity tolerance:  Fatigues in greater than 30 min.    FUNCTIONAL OUTCOME MEASURES: FOTO: 57   UPPER EXTREMITY ROM      Active ROM Right Eval: WFL Left eval   Shoulder flexion   132  Shoulder abduction   80  Shoulder adduction      Shoulder extension      Shoulder internal rotation      Shoulder external rotation      Elbow flexion   140  Elbow extension   WNL  Wrist flexion   65  Wrist extension   -10  Wrist ulnar deviation      Wrist radial deviation      Wrist pronation      Wrist supination      (Blank rows = not tested)   Left digit flexion to [facility]: 2nd: 0cm, 3rd: 0cm, 4th: 0cm, 5th: 0cm   Limited Left full 2nd digit extension     UPPER EXTREMITY MMT:      MMT Right Eval: 4+/5 overall Left eval  Shoulder flexion   3/5  Shoulder abduction   3-/5  Shoulder adduction      Shoulder extension      Shoulder internal rotation      Shoulder external rotation      Middle trapezius      Lower trapezius      Elbow flexion   3+/5  Elbow extension   3+/5  Wrist flexion      Wrist extension   2-/5  Wrist ulnar deviation      Wrist radial deviation      Wrist pronation      Wrist supination      (Blank rows = not tested)   HAND FUNCTION: Grip strength: Right: 26#, Left: 10# Pinch strength: Right 8#, Left: 3#, 3 Pt. Pinch strength: Right: 9#, L: 2#   COORDINATION: Right: 22 sec., Left: <5 min. To place 7 pegs with increased compensation proximally in the trunk, and through reflexive associated reactions.   SENSATION: Light touch: WFL, proprioceptive awareness: Intact   EDEMA: N/A   MUSCLE TONE: LUE: Hypotonic   COGNITION: Overall cognitive status: WFL for tasks assessed. Pt. Is impulsive at times.   VISION: Subjective report: Pt. report having shingles affecting left eye  s/p 1 year. Has nerve pain Baseline vision: Wears glasses for reading only Visual history:  updated see clinical impression   VISION ASSESSMENT:    WFL for tasks performed   PERCEPTION: Intact   PRAXIS: Impaired: Motor planning   OBSERVATIONS:  Pt. More alert, and engaging since prior to the most recent hospitalization.     TODAY'S  TREATMENT:    Ther. Ex.:   Pt. tolerated AAROM,  and PROM for left  scapular elevation, depression, abduction/rotation, shoulder flexion, and abduction. Pt. performed reps of digit extension, and thumb IP extension with her hand positioned flat at the tabletop surface.   Neuromuscular re-education:   Pt. worked on  isolating 2nd digit extension for sliding coins off of a raised flat surface to the thumb in preparation for releasing them into a container. Pt. Is encouraged to fully extend her digits when releasing the coins. Pt. Required alternating proprioception through her left hand at the tabletop surface.   Pt. returned to therapy after having been out due to a fall off her couch with multiple skin tears on her left forearm, and elbow. Pt. presented multiple scabbed areas on her forearm, with a skin tear on her elbow with a large piece of gauze stuck into it. Pt./caregiver report they tried to remove it, however were hesitant to remove it any further 2/2 fear that it would tear the skin further, and cause excessive bleeding due to the blood thinners she is taking. This therapist agreed, and encouraged them to follow-up with a physician to assist with this. Pt.'s husband reports that they are thinking of going to urgent care to have help removing the gauze. Pt. focused on promoting 2nd digit extension, which is improving. Pt. was able to perform more reps before needing proprioception through the left hand. Pt. presents with left shoulder pain initially with abduction, however improved with ROM. Patient continues to work on improving left shoulder elbow and wrist strength, improving motor control and coordination in order to prepare the left upper extremity and hand for functional reaching, and engaging the left upper extremity when performing hair care, buttoning, securely holding flowers, cutting food, and manipulating/handling/sorting utensils from a drawer for setting a table, as well as adaptive and  compensatory strategies during daily ADL and IADL care.     PATIENT EDUCATION: Education details: left hand digit extension Person educated: Patient and Spouse Education method: Customer service manager Education comprehension: verbalized understanding, returned demonstration, and needs further education     HOME EXERCISE PROGRAM:   Assess ongoing need for HEP       GOALS: Goals reviewed with patient? Yes   SHORT TERM GOALS: Target date: 01/27/2023       1. Patient will be independent with home exercise program for the left upper extremity Baseline: No current home exercise program Goal status: INITIAL   2. Pt. Will improve active left 2nd digit extension to be able able to isolate her 2nd digit in preparation for pressing/pushing buttons on appliances, phones, or remotes. Baseline: Is unable to actively full digit extension Goal status: INITIAL     LONG TERM GOALS: Target date: 03/10/2023     Patient will improve left shoulder strength by 2 mm grades to be able to sustain UE's in elevation long enough to wash her hair.  Baseline: Eval: Left shoulder flexion: 3/5, abduction: 3-/5 Goal status: INITIAL   2.  Patient will improve left shoulder active abduction to be able to comb her hair Baseline: Eval: Left shoulder abduction is 80(108) Goal status: INITIAL   3.  Patient will independently button her shirt with modified independence. Baseline: Eval: Patient requires has difficulty.  Goal status: INITIAL   4.  Patient well improve left grip strength preparation  for securely holding flowers. Baseline: Eval: Pt. Is unable to securely hold flowers. Goal status: INITIAL   5.  Pt. will independently recall adaptive  strategies for performing ADL tasks including: flossing teeth,  donning bra, applying makeup. Baseline: Eval: Pt. to be provided with adaptive strategies. Goal status: INITIAL   6.  Pt. will improve Foto score by 2 points to reflect patient perceived  performance improvement assessment specific ADLs  and IADLs Baseline: Eval: 57 Goal status: INITIAL  7.  Pt. will improve left hand coordination skills in order to be able to handle, and sort utensils in a drawer.     Baseline: Eval: Pt. has difficulty sorting, and placing utensils with the left hand. Left FMC : >5 min. For 7 pegs on the 9 hole peg test.    Goal Status: INITIAL       ASSESSMENT:   CLINICAL IMPRESSION:  Pt. returned to therapy after having been out due to a fall off her couch with multiple skin tears on her left forearm, and elbow. Pt. presented multiple scabbed areas on her forearm, with a skin tear on her elbow with a large piece of gauze stuck into it. Pt./caregiver report they tried to remove it, however were hesitant to remove it any further 2/2 fear that it would tear the skin further, and cause excessive bleeding due to the blood thinners she is taking. This therapist agreed, and encouraged them to follow-up with a physician to assist with this. Pt.'s husband reports that they are thinking of going to urgent care to have help removing the gauze. Pt. focused on promoting 2nd digit extension, which is improving. Pt. was able to perform more reps before needing proprioception through the left hand. Pt. presents with left shoulder pain initially with abduction, however improved with ROM. Patient continues to work on improving left shoulder elbow and wrist strength, improving motor control and coordination in order to prepare the left upper extremity and hand for functional reaching, and engaging the left upper extremity when performing hair care, buttoning, securely holding flowers, cutting food, and manipulating/handling/sorting utensils from a drawer for setting a table, as well as adaptive and compensatory strategies during daily ADL and IADL care.   PERFORMANCE DEFICITS in functional skills including ADLs, IADLs, coordination, proprioception, ROM, strength, Scott, and GMC,  cognitive skills including memory, and psychosocial skills including coping strategies, environmental adaptation, interpersonal interactions, and routines and behaviors.    IMPAIRMENTS are limiting patient from ADLs, IADLs, education, leisure, and social participation.    COMORBIDITIES may have co-morbidities  that affects occupational performance. Patient will benefit from skilled OT to address above impairments and improve overall function.   MODIFICATION OR ASSISTANCE TO COMPLETE EVALUATION: Min-Moderate modification of tasks or assist with assess necessary to complete an evaluation.   OT OCCUPATIONAL PROFILE AND HISTORY: Detailed assessment: Review of records and additional review of physical, cognitive, psychosocial history related to current functional performance.   CLINICAL DECISION MAKING: Moderate - several treatment options, min-mod task modification necessary   REHAB POTENTIAL: Good   EVALUATION COMPLEXITY: Moderate      PLAN: OT FREQUENCY: 2x/week   OT DURATION: 12 weeks   PLANNED INTERVENTIONS: self care/ADL training, therapeutic exercise, therapeutic activity, neuromuscular re-education, manual therapy, passive range of motion, functional mobility training, electrical stimulation, and paraffin   RECOMMENDED OTHER SERVICES: PT   CONSULTED AND AGREED WITH PLAN OF CARE: Patient and family member/caregiver   PLAN FOR NEXT SESSION: Initiate OT treatment  Harrel Carina, MS, OTR/L   Harrel Carina, OT 12/31/2022, 8:50 AM

## 2023-01-01 ENCOUNTER — Ambulatory Visit: Payer: Medicare HMO

## 2023-01-01 ENCOUNTER — Ambulatory Visit: Payer: Medicare HMO | Admitting: Occupational Therapy

## 2023-01-01 ENCOUNTER — Encounter: Payer: Medicare HMO | Admitting: Speech Pathology

## 2023-01-01 DIAGNOSIS — M6281 Muscle weakness (generalized): Secondary | ICD-10-CM

## 2023-01-01 DIAGNOSIS — R278 Other lack of coordination: Secondary | ICD-10-CM | POA: Diagnosis not present

## 2023-01-01 DIAGNOSIS — R8290 Unspecified abnormal findings in urine: Secondary | ICD-10-CM | POA: Diagnosis not present

## 2023-01-01 NOTE — Therapy (Signed)
OUTPATIENT OCCUPATIONAL THERAPY NEURO TREATMENT  Patient Name: [name] MRN: [id number] DOB:08-02-1941, 82 y.o., female Today's Date: 12/31/2022  PCP: Dr. Doy Hutching REFERRING PROVIDER:  Dr. Doy Hutching  END OF SESSION:  OT End of Session - 01/01/23 2250     Visit Number 4    Number of Visits 24    Date for OT Re-Evaluation 03/10/23    Authorization Time Period Progress report period starting 09/09/2022    OT Start Time 51    OT Stop Time 1645    OT Time Calculation (min) 45 min    Activity Tolerance Patient limited by fatigue    Behavior During Therapy WFL for tasks assessed/performed               Past Medical History:  Diagnosis Date   Anemia    Arthritis    Asthma    uses inhaler just prior to surgery to avoid attack   Back pain    from previous injury   Complication of anesthesia    has woken  up during 2 different surgery   Depression    no current issue/treatment; situation   Gallstones    GERD (gastroesophageal reflux disease)    Hiatal hernia    patient does NOT have nerve/muscle disease   History of kidney stones    HLD (hyperlipidemia)    HTN (hypertension)    Hypothyroidism    Kidney stones    Knee pain    Nausea and vomiting 10/15/2022   Non-diabetic pancreatic hormone dysfunction years   pt. 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   Pancreatitis    Pneumonia    Seizures (HCC)    caused by dye injected during a procedure   Shortness of breath    with exertion   Sinus problem    frequent infections/congestion   Stroke ([place]) 2021   reports having CVA in 2021 and having mini strokes before that   Thyroid disease    Past Surgical History:  Procedure Laterality Date   ABDOMINAL HYSTERECTOMY     APPENDECTOMY     CARPAL TUNNEL RELEASE  10+ years ago   bilateral   EYE SURGERY  3 yrs ago   bilateral cataracts   FOOT OSTEOTOMY  6 weeks ago   Left foot: great, 2nd & 3rd   FOOT OSTEOTOMY  5 years ago   Right great toe   HAND SURGERY  Bilateral 2011-most recent   multiple hand surgeries, 2 on left, 3 on right   KNEE ARTHROPLASTY Right 04/28/2022   Procedure: COMPUTER ASSISTED TOTAL KNEE ARTHROPLASTY;  Surgeon: Dereck Leep, MD;  Location: [facility];  Service: Orthopedics;  Laterality: Right;   LOOP RECORDER INSERTION N/A 05/16/2020   Procedure: LOOP RECORDER INSERTION;  Surgeon: Isaias Cowman, MD;  Location: [facility] [facility];  Service: Cardiovascular;  Laterality: N/A;   NASAL SINUS SURGERY  most recent 7-8 yrs ago   7 sinus surgeries    TRIGGER FINGER RELEASE  11/19/2011   Procedure: RELEASE TRIGGER FINGER/A-1 PULLEY;  Surgeon: Wynonia Sours, MD;  Location: [place];  Service: Orthopedics;  Laterality: Right;  release a-1 pulley right index finger and cyst removal   WRIST GANGLION EXCISION  1980's   right   Patient Active Problem List   Diagnosis Date Noted   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 11/27/2022   Hypomagnesemia 11/27/2022   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   HLD (hyperlipidemia) 11/23/2022   Asthma 11/23/2022  Depression with anxiety 11/23/2022   Chronic diastolic CHF (congestive heart failure) ([place]) 11/23/2022   Normocytic anemia 11/23/2022   Aspiration pneumonia ([place]) 11/23/2022   AKI (acute kidney injury) ([place]) 11/23/2022   Abdominal pain 11/23/2022   Mesenteric mass 11/23/2022   Coffee ground emesis 11/23/2022   Nausea and vomiting 10/15/2022   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   Fatigue 09/17/2022   Left hemiparesis ([place]) 09/17/2022   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[place]) 08/22/2022   GERD (gastroesophageal reflux disease) 08/21/2022   Agitation 08/20/2022   Acute left-sided weakness 08/20/2022   Expressive aphasia    Stroke ([place]) 08/19/2022   Leukocytosis 08/19/2022   History of urticaria 04/28/2022   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   Primary osteoarthritis of left knee 02/24/2022   Primary osteoarthritis of right knee  02/24/2022   Lumbar spondylolysis 04/12/2020   History of CVA (cerebrovascular accident) 03/26/2020   Low back pain radiating to right lower extremity 03/21/2020   B12 deficiency 03/06/2020   Positive anti-CCP test 12/21/2019   Arthralgia 12/13/2019   Dermatitis 12/13/2019   Rheumatoid factor positive 12/13/2019   Essential hypertension 12/11/2018   Palpitations 12/11/2018   Acquired hypothyroidism 11/10/2018   Arthritis of knee 09/17/2016   Anxiety 11/22/2014   Asthma without status asthmaticus 11/22/2014   Benign neoplasm of colon, unspecified 11/22/2014   Environmental allergies 11/22/2014   Hypertriglyceridemia 11/22/2014   Hypokalemia 11/22/2014   Personal history of disease of skin and subcutaneous tissue 11/22/2014   REFERRING DIAG: CVA   THERAPY DIAG:  Muscle weakness (generalized)   Other lack of coordination   Rationale for Evaluation and Treatment Rehabilitation   SUBJECTIVE:    SUBJECTIVE STATEMENT:  Pt.  Reports that she was able to remove the the gaze from her left forearm/elbow skin tear.   Pt accompanied by: significant other   PERTINENT HISTORY: Patient is an 82 year-old female who was admitted to [facility] on 11/23/2022 with a cerebellar CVA. 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 Pt. Was admitted to Inpatient Rehab from 12/21-1/02/2023. 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  Patient underwent inpatient rehabilitation for 2 weeks.  Patient was assessed and was scheduled for a knee replacement on 08/29/2022 however had to cancel it due to having had a CVA.  Patient had a recent fall 2 days after discharging from inpatient rehab. Past Medical History includes: Knee replacement, essential HTN, hypokalemia, leukocytosis, seizures, positive anti-- CCP test, anxiety disorder, mini strokes.  Patient had shingles with left eye nerve pain s/p 1 year ago.    PRECAUTIONS: Fall    WEIGHT BEARING RESTRICTIONS No   PAIN:  Are you having pain? left elbow/shoulder initially improved with ROM: NR   FALLS: Has patient fallen in last 6 months? Yes. Number of falls 1   LIVING ENVIRONMENT: Lives with: Lives with Spouse Lives in: House/apartment Stairs: 2 storey home, resides on the first floor.  External: 2 stairs front no rails, and 6 in back with rails Has following equipment at home: Single point cane, Walker - 2 wheeled, Environmental consultant - 4 wheeled, Shower bench, and bed side commode   PLOF: Independent   PATIENT GOALS  To Regain the use of her left arm   OBJECTIVE:    HAND DOMINANCE: Right   ADLs: Overall ADLs: Husband assists pt. as needed Transfers/ambulation related to ADLs:Pt. Uses a 3 wheeled walker with Husband assist. Eating: Pt. Is independent with the right hand. Pt. has difficulty cutting food.  Grooming: Pt. is using her right hand, however has difficulty sustaining her LUE in elevation to assist with haircare. UB Dressing: Pt. Is independent donning a pullover shirt, and button down shirt. Has difficulty with buttoning, LB Dressing:  Independent donning pants, and socks. Difficulty tying shoes. Toileting: Independent Bathing: Pt. Is able to engage her right hand. Tub Shower transfers: Supervision Equipment: See above for equipment     IADLs: Shopping:  Has not had the opportunity for grocery shopping yet Light housekeeping: Husband is assisting with light house keeping Meal Prep:  Dependent Community mobility: Relies of family/friends Medication management: Husband assisting with weekly pillbox set-up, and administering medication. Financial management: TBD Handwriting: 75% legible   MOBILITY STATUS: Hx of falls   POSTURE COMMENTS:  No Significant postural limitations Sitting balance: supported sitting balance WFL   ACTIVITY TOLERANCE: Activity tolerance:  Fatigues in greater than 30 min.    FUNCTIONAL OUTCOME MEASURES: FOTO: 57   UPPER  EXTREMITY ROM      Active ROM Right Eval: WFL Left eval  Shoulder flexion   132  Shoulder abduction   80  Shoulder adduction      Shoulder extension      Shoulder internal rotation      Shoulder external rotation      Elbow flexion   140  Elbow extension   WNL  Wrist flexion   65  Wrist extension   -10  Wrist ulnar deviation      Wrist radial deviation      Wrist pronation      Wrist supination      (Blank rows = not tested)   Left digit flexion to [facility]: 2nd: 0cm, 3rd: 0cm, 4th: 0cm, 5th: 0cm   Limited Left full 2nd digit extension     UPPER EXTREMITY MMT:      MMT Right Eval: 4+/5 overall Left eval  Shoulder flexion   3/5  Shoulder abduction   3-/5  Shoulder adduction      Shoulder extension      Shoulder internal rotation      Shoulder external rotation      Middle trapezius      Lower trapezius      Elbow flexion   3+/5  Elbow extension   3+/5  Wrist flexion      Wrist extension   2-/5  Wrist ulnar deviation      Wrist radial deviation      Wrist pronation      Wrist supination      (Blank rows = not tested)   HAND FUNCTION: Grip strength: Right: 26#, Left: 10# Pinch strength: Right 8#, Left: 3#, 3 Pt. Pinch strength: Right: 9#, L: 2#   COORDINATION: Right: 22 sec., Left: <5 min. To place 7 pegs with increased compensation proximally in the trunk, and through reflexive associated reactions.   SENSATION: Light touch: WFL, proprioceptive awareness: Intact   EDEMA: N/A   MUSCLE TONE: LUE: Hypotonic   COGNITION: Overall cognitive status: WFL for tasks assessed. Pt. Is impulsive at times.   VISION: Subjective report: Pt. report having shingles affecting left eye  s/p 1 year. Has nerve pain Baseline vision: Wears glasses for reading only Visual history:  updated see clinical impression   VISION ASSESSMENT:    WFL for tasks performed   PERCEPTION: Intact   PRAXIS: Impaired: Motor planning   OBSERVATIONS:  Pt. More alert, and engaging since  prior to the most recent hospitalization.     TODAY'S TREATMENT:  Ther. Ex.:   Pt. tolerated AAROM, and PROM for left  scapular elevation, depression, abduction/rotation, shoulder flexion, and abduction. Pt. performed reps of digit extension, and thumb IP extension with her hand positioned flat at the tabletop surface.   Neuromuscular re-education:   Pt. worked on isolating 2nd digit extension, working on sliding 1", 3/4", and 1/2" washers off of an elevated flat board surface to her thumb. Pt. then worked on placing them onto a horizontal dowel.    Pt. presented to the therapy clinic with an open area on the posterior aspect of her left elbow from where the gauze was stuck to her. Pt. And husband placed a bandaid over the middle portion to cover it. Pt. presented with increased 2nd digit extension. Pt. was able to perform more reps before needing proprioception through the left hand. Pt. presents with left shoulder pain initially with abduction, however continues to improve with ROM.  Pt. continues to compensation eliciting associated reactions. Patient continues to work on improving left shoulder elbow and wrist strength, improving motor control and coordination in order to prepare the left upper extremity and hand for functional reaching, and engaging the left upper extremity when performing hair care, buttoning, securely holding flowers, cutting food, and manipulating/handling/sorting utensils from a drawer for setting a table, as well as adaptive and compensatory strategies during daily ADL and IADL care.     PATIENT EDUCATION: Education details: left hand digit extension Person educated: Patient and Spouse Education method: Customer service manager Education comprehension: verbalized understanding, returned demonstration, and needs further education     HOME EXERCISE PROGRAM:   Assess ongoing need for HEP       GOALS: Goals reviewed with patient? Yes   SHORT TERM GOALS:  Target date: 01/27/2023       1. Patient will be independent with home exercise program for the left upper extremity Baseline: No current home exercise program Goal status: INITIAL   2. Pt. Will improve active left 2nd digit extension to be able able to isolate her 2nd digit in preparation for pressing/pushing buttons on appliances, phones, or remotes. Baseline: Is unable to actively full digit extension Goal status: INITIAL     LONG TERM GOALS: Target date: 03/10/2023     Patient will improve left shoulder strength by 2 mm grades to be able to sustain UE's in elevation long enough to wash her hair.  Baseline: Eval: Left shoulder flexion: 3/5, abduction: 3-/5 Goal status: INITIAL   2.  Patient will improve left shoulder active abduction to be able to comb her hair Baseline: Eval: Left shoulder abduction is 80(108) Goal status: INITIAL   3.  Patient will independently button her shirt with modified independence. Baseline: Eval: Patient requires has difficulty.  Goal status: INITIAL   4.  Patient well improve left grip strength preparation  for securely holding flowers. Baseline: Eval: Pt. Is unable to securely hold flowers. Goal status: INITIAL   5.  Pt. will independently recall adaptive  strategies for performing ADL tasks including: flossing teeth, donning bra, applying makeup. Baseline: Eval: Pt. to be provided with adaptive strategies. Goal status: INITIAL   6.  Pt. will improve Foto score by 2 points to reflect patient perceived performance improvement assessment specific ADLs  and IADLs Baseline: Eval: 57 Goal status: INITIAL  7.  Pt. will improve left hand coordination skills in order to be able to handle, and sort utensils in a drawer.     Baseline: Eval: Pt. has difficulty sorting, and placing  utensils with the left hand. Left FMC : >5 min. For 7 pegs on the 9 hole peg test.    Goal Status: INITIAL       ASSESSMENT:   CLINICAL IMPRESSION:  Pt. presented to the  therapy clinic with an open area on the posterior aspect of her left elbow from where the gauze was stuck to her. Pt. And husband placed a bandaid over the middle portion to cover it. Pt. presented with increased 2nd digit extension. Pt. was able to perform more reps before needing proprioception through the left hand. Pt. presents with left shoulder pain initially with abduction, however continues to improve with ROM.  Pt. continues to compensation eliciting associated reactions. Patient continues to work on improving left shoulder elbow and wrist strength, improving motor control and coordination in order to prepare the left upper extremity and hand for functional reaching, and engaging the left upper extremity when performing hair care, buttoning, securely holding flowers, cutting food, and manipulating/handling/sorting utensils from a drawer for setting a table, as well as adaptive and compensatory strategies during daily ADL and IADL care.   PERFORMANCE DEFICITS in functional skills including ADLs, IADLs, coordination, proprioception, ROM, strength, [place], and GMC, cognitive skills including memory, and psychosocial skills including coping strategies, environmental adaptation, interpersonal interactions, and routines and behaviors.    IMPAIRMENTS are limiting patient from ADLs, IADLs, education, leisure, and social participation.    COMORBIDITIES may have co-morbidities  that affects occupational performance. Patient will benefit from skilled OT to address above impairments and improve overall function.   MODIFICATION OR ASSISTANCE TO COMPLETE EVALUATION: Min-Moderate modification of tasks or assist with assess necessary to complete an evaluation.   OT OCCUPATIONAL PROFILE AND HISTORY: Detailed assessment: Review of records and additional review of physical, cognitive, psychosocial history related to current functional performance.   CLINICAL DECISION MAKING: Moderate - several treatment options,  min-mod task modification necessary   REHAB POTENTIAL: Good   EVALUATION COMPLEXITY: Moderate      PLAN: OT FREQUENCY: 2x/week   OT DURATION: 12 weeks   PLANNED INTERVENTIONS: self care/ADL training, therapeutic exercise, therapeutic activity, neuromuscular re-education, manual therapy, passive range of motion, functional mobility training, electrical stimulation, and paraffin   RECOMMENDED OTHER SERVICES: PT   CONSULTED AND AGREED WITH PLAN OF CARE: Patient and family member/caregiver   PLAN FOR NEXT SESSION: Initiate OT treatment  Harrel Carina, MS, OTR/L   Harrel Carina, OT 12/31/2022, 8:50 AM

## 2023-01-06 ENCOUNTER — Ambulatory Visit: Payer: Medicare HMO | Admitting: Occupational Therapy

## 2023-01-06 ENCOUNTER — Encounter: Payer: Medicare HMO | Admitting: Speech Pathology

## 2023-01-06 ENCOUNTER — Ambulatory Visit: Payer: Medicare HMO

## 2023-01-06 DIAGNOSIS — M6281 Muscle weakness (generalized): Secondary | ICD-10-CM

## 2023-01-07 NOTE — Therapy (Signed)
OUTPATIENT OCCUPATIONAL THERAPY NEURO TREATMENT  Patient Name: [name] MRN: [id number] DOB:1941/10/08, 82 y.o., female Today's Date: 01/07/2023  PCP: Dr. Doy Hutching REFERRING PROVIDER:  Dr. Doy Hutching  END OF SESSION:  OT End of Session - 01/07/23 0917     Visit Number 5    Number of Visits 24    Date for OT Re-Evaluation 03/10/23    Authorization Time Period Progress report period starting 09/09/2022    OT Start Time 1648    OT Stop Time 1730    OT Time Calculation (min) 42 min    Activity Tolerance Patient limited by fatigue    Behavior During Therapy [facility] for tasks assessed/performed               Past Medical History:  Diagnosis Date   Anemia    Arthritis    Asthma    uses inhaler just prior to surgery to avoid attack   Back pain    from previous injury   Complication of anesthesia    has woken  up during 2 different surgery   Depression    no current issue/treatment; situation   Gallstones    GERD (gastroesophageal reflux disease)    Hiatal hernia    patient does NOT have nerve/muscle disease   History of kidney stones    HLD (hyperlipidemia)    HTN (hypertension)    Hypothyroidism    Kidney stones    Knee pain    Nausea and vomiting 10/15/2022   Non-diabetic pancreatic hormone dysfunction years   pt. 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   Pancreatitis    Pneumonia    Seizures ([place])    caused by dye injected during a procedure   Shortness of breath    with exertion   Sinus problem    frequent infections/congestion   Stroke ([place]) 2021   reports having CVA in 2021 and having mini strokes before that   Thyroid disease    Past Surgical History:  Procedure Laterality Date   ABDOMINAL HYSTERECTOMY     APPENDECTOMY     CARPAL TUNNEL RELEASE  10+ years ago   bilateral   EYE SURGERY  3 yrs ago   bilateral cataracts   FOOT OSTEOTOMY  6 weeks ago   Left foot: great, 2nd & 3rd   FOOT OSTEOTOMY  5 years ago   Right great toe   HAND SURGERY  Bilateral 2011-most recent   multiple hand surgeries, 2 on left, 3 on right   KNEE ARTHROPLASTY Right 04/28/2022   Procedure: COMPUTER ASSISTED TOTAL KNEE ARTHROPLASTY;  Surgeon: Dereck Leep, MD;  Location: [facility];  Service: Orthopedics;  Laterality: Right;   LOOP RECORDER INSERTION N/A 05/16/2020   Procedure: LOOP RECORDER INSERTION;  Surgeon: Isaias Cowman, MD;  Location: [place] [facility];  Service: Cardiovascular;  Laterality: N/A;   NASAL SINUS SURGERY  most recent 7-8 yrs ago   7 sinus surgeries    TRIGGER FINGER RELEASE  11/19/2011   Procedure: RELEASE TRIGGER FINGER/A-1 PULLEY;  Surgeon: Wynonia Sours, MD;  Location: [place];  Service: Orthopedics;  Laterality: Right;  release a-1 pulley right index finger and cyst removal   WRIST GANGLION EXCISION  1980's   right   Patient Active Problem List   Diagnosis Date Noted   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 11/27/2022   Hypomagnesemia 11/27/2022   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   HLD (hyperlipidemia) 11/23/2022   Asthma 11/23/2022  Depression with anxiety 11/23/2022   Chronic diastolic CHF (congestive heart failure) ([place]) 11/23/2022   Normocytic anemia 11/23/2022   Aspiration pneumonia ([place]) 11/23/2022   AKI (acute kidney injury) ([place]) 11/23/2022   Abdominal pain 11/23/2022   Mesenteric mass 11/23/2022   Coffee ground emesis 11/23/2022   Nausea and vomiting 10/15/2022   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   Fatigue 09/17/2022   Left hemiparesis ([place]) 09/17/2022   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[place]) 08/22/2022   GERD (gastroesophageal reflux disease) 08/21/2022   Agitation 08/20/2022   Acute left-sided weakness 08/20/2022   Expressive aphasia    Stroke ([place]) 08/19/2022   Leukocytosis 08/19/2022   History of urticaria 04/28/2022   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   Primary osteoarthritis of left knee 02/24/2022   Primary osteoarthritis of right knee  02/24/2022   Lumbar spondylolysis 04/12/2020   History of CVA (cerebrovascular accident) 03/26/2020   Low back pain radiating to right lower extremity 03/21/2020   B12 deficiency 03/06/2020   Positive anti-CCP test 12/21/2019   Arthralgia 12/13/2019   Dermatitis 12/13/2019   Rheumatoid factor positive 12/13/2019   Essential hypertension 12/11/2018   Palpitations 12/11/2018   Acquired hypothyroidism 11/10/2018   Arthritis of knee 09/17/2016   Anxiety 11/22/2014   Asthma without status asthmaticus 11/22/2014   Benign neoplasm of colon, unspecified 11/22/2014   Environmental allergies 11/22/2014   Hypertriglyceridemia 11/22/2014   Hypokalemia 11/22/2014   Personal history of disease of skin and subcutaneous tissue 11/22/2014   REFERRING DIAG: CVA   THERAPY DIAG:  Muscle weakness (generalized)   Other lack of coordination   Rationale for Evaluation and Treatment Rehabilitation   SUBJECTIVE:   Pt. Reports feeling well today, and that her skin tear is healing.   SUBJECTIVE STATEMENT:  Pt.  Reports that she was able to remove the the gaze from her left forearm/elbow skin tear.   Pt accompanied by: significant other   PERTINENT HISTORY: Patient is an 82 year-old female who was admitted to [facility] on 11/23/2022 with a cerebellar CVA. 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 Pt. Was admitted to Inpatient Rehab from 12/21-1/02/2023. 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  Patient underwent inpatient rehabilitation for 2 weeks.  Patient was assessed and was scheduled for a knee replacement on 08/29/2022 however had to cancel it due to having had a CVA.  Patient had a recent fall 2 days after discharging from inpatient rehab. Past Medical History includes: Knee replacement, essential HTN, hypokalemia, leukocytosis, seizures, positive anti-- CCP test, anxiety disorder, mini strokes.  Patient had  shingles with left eye nerve pain s/p 1 year ago.    PRECAUTIONS: Fall   WEIGHT BEARING RESTRICTIONS No   PAIN:  Are you having pain? left elbow/shoulder initially improved with ROM: NR   FALLS: Has patient fallen in last 6 months? Yes. Number of falls 1   LIVING ENVIRONMENT: Lives with: Lives with Spouse Lives in: House/apartment Stairs: 2 storey home, resides on the first floor.  External: 2 stairs front no rails, and 6 in back with rails Has following equipment at home: Single point cane, Walker - 2 wheeled, Environmental consultant - 4 wheeled, Shower bench, and bed side commode   PLOF: Independent   PATIENT GOALS  To Regain the use of her left arm   OBJECTIVE:    HAND DOMINANCE: Right   ADLs: Overall ADLs: Husband assists pt. as needed Transfers/ambulation related to ADLs:Pt. Uses a 3 wheeled walker with Husband assist.  Eating: Pt. Is independent with the right hand. Pt. has difficulty cutting food. Grooming: Pt. is using her right hand, however has difficulty sustaining her LUE in elevation to assist with haircare. UB Dressing: Pt. Is independent donning a pullover shirt, and button down shirt. Has difficulty with buttoning, LB Dressing:  Independent donning pants, and socks. Difficulty tying shoes. Toileting: Independent Bathing: Pt. Is able to engage her right hand. Tub Shower transfers: Supervision Equipment: See above for equipment     IADLs: Shopping:  Has not had the opportunity for grocery shopping yet Light housekeeping: Husband is assisting with light house keeping Meal Prep:  Dependent Community mobility: Relies of family/friends Medication management: Husband assisting with weekly pillbox set-up, and administering medication. Financial management: TBD Handwriting: 75% legible   MOBILITY STATUS: Hx of falls   POSTURE COMMENTS:  No Significant postural limitations Sitting balance: supported sitting balance WFL   ACTIVITY TOLERANCE: Activity tolerance:  Fatigues in  greater than 30 min.    FUNCTIONAL OUTCOME MEASURES: FOTO: 57   UPPER EXTREMITY ROM      Active ROM Right Eval: WFL Left eval  Shoulder flexion   132  Shoulder abduction   80  Shoulder adduction      Shoulder extension      Shoulder internal rotation      Shoulder external rotation      Elbow flexion   140  Elbow extension   WNL  Wrist flexion   65  Wrist extension   -10  Wrist ulnar deviation      Wrist radial deviation      Wrist pronation      Wrist supination      (Blank rows = not tested)   Left digit flexion to [facility]: 2nd: 0cm, 3rd: 0cm, 4th: 0cm, 5th: 0cm   Limited Left full 2nd digit extension     UPPER EXTREMITY MMT:      MMT Right Eval: 4+/5 overall Left eval  Shoulder flexion   3/5  Shoulder abduction   3-/5  Shoulder adduction      Shoulder extension      Shoulder internal rotation      Shoulder external rotation      Middle trapezius      Lower trapezius      Elbow flexion   3+/5  Elbow extension   3+/5  Wrist flexion      Wrist extension   2-/5  Wrist ulnar deviation      Wrist radial deviation      Wrist pronation      Wrist supination      (Blank rows = not tested)   HAND FUNCTION: Grip strength: Right: 26#, Left: 10# Pinch strength: Right 8#, Left: 3#, 3 Pt. Pinch strength: Right: 9#, L: 2#   COORDINATION: Right: 22 sec., Left: <5 min. To place 7 pegs with increased compensation proximally in the trunk, and through reflexive associated reactions.   SENSATION: Light touch: WFL, proprioceptive awareness: Intact   EDEMA: N/A   MUSCLE TONE: LUE: Hypotonic   COGNITION: Overall cognitive status: WFL for tasks assessed. Pt. Is impulsive at times.   VISION: Subjective report: Pt. report having shingles affecting left eye  s/p 1 year. Has nerve pain Baseline vision: Wears glasses for reading only Visual history:  updated see clinical impression   VISION ASSESSMENT:    WFL for tasks performed   PERCEPTION: Intact   PRAXIS:  Impaired: Motor planning   OBSERVATIONS:  Pt. More alert, and engaging  since prior to the most recent hospitalization.     TODAY'S TREATMENT:    Ther. Ex.:   Pt. performed reps of digit extension, digit abduction with her hand positioned flat at the tabletop surface. Pt. worked on multiple repeated reps of gross digit extension releasing pegs from her hand with emphasis placed on 2nd digit extension. Pt. Required intermittent proprioception through her flat hand at the tabletop surface.      Pt. presented to the therapy clinic with an open area on the posterior aspect of her left elbow from where the gauze was stuck to her. Pt. and husband placed a bandaid over the middle portion to cover it. Pt. presented with increased 2nd digit extension. Pt. was able to perform more reps before needing proprioception through the left hand. Pt. presents with left shoulder pain initially with abduction, however continues to improve with ROM.  Pt. continues to compensation eliciting associated reactions. Patient continues to work on improving left shoulder elbow and wrist strength, improving motor control and coordination in order to prepare the left upper extremity and hand for functional reaching, and engaging the left upper extremity when performing hair care, buttoning, securely holding flowers, cutting food, and manipulating/handling/sorting utensils from a drawer for setting a table, as well as adaptive and compensatory strategies during daily ADL and IADL care.     PATIENT EDUCATION: Education details: left hand digit extension Person educated: Patient and Spouse Education method: Customer service manager Education comprehension: verbalized understanding, returned demonstration, and needs further education     HOME EXERCISE PROGRAM:   Assess ongoing need for HEP       GOALS: Goals reviewed with patient? Yes   SHORT TERM GOALS: Target date: 01/27/2023       1. Patient will be independent  with home exercise program for the left upper extremity Baseline: No current home exercise program Goal status: INITIAL   2. Pt. Will improve active left 2nd digit extension to be able able to isolate her 2nd digit in preparation for pressing/pushing buttons on appliances, phones, or remotes. Baseline: Is unable to actively full digit extension Goal status: INITIAL     LONG TERM GOALS: Target date: 03/10/2023     Patient will improve left shoulder strength by 2 mm grades to be able to sustain UE's in elevation long enough to wash her hair.  Baseline: Eval: Left shoulder flexion: 3/5, abduction: 3-/5 Goal status: INITIAL   2.  Patient will improve left shoulder active abduction to be able to comb her hair Baseline: Eval: Left shoulder abduction is 80(108) Goal status: INITIAL   3.  Patient will independently button her shirt with modified independence. Baseline: Eval: Patient requires has difficulty.  Goal status: INITIAL   4.  Patient well improve left grip strength preparation  for securely holding flowers. Baseline: Eval: Pt. Is unable to securely hold flowers. Goal status: INITIAL   5.  Pt. will independently recall adaptive  strategies for performing ADL tasks including: flossing teeth, donning bra, applying makeup. Baseline: Eval: Pt. to be provided with adaptive strategies. Goal status: INITIAL   6.  Pt. will improve Foto score by 2 points to reflect patient perceived performance improvement assessment specific ADLs  and IADLs Baseline: Eval: 57 Goal status: INITIAL  7.  Pt. will improve left hand coordination skills in order to be able to handle, and sort utensils in a drawer.     Baseline: Eval: Pt. has difficulty sorting, and placing utensils with the left hand. Left FMC : >  5 min. For 7 pegs on the 9 hole peg test.    Goal Status: INITIAL       ASSESSMENT:   CLINICAL IMPRESSION:  Pt. presented to the therapy clinic with an open area on the posterior aspect of her  left elbow from where the gauze was stuck to her. Pt. And husband placed a bandaid over the middle portion to cover it. Pt. presented with increased 2nd digit extension. Pt. was able to perform more reps before needing proprioception through the left hand. Pt. presents with left shoulder pain initially with abduction, however continues to improve with ROM.  Pt. continues to compensation eliciting associated reactions. Patient continues to work on improving left shoulder elbow and wrist strength, improving motor control and coordination in order to prepare the left upper extremity and hand for functional reaching, and engaging the left upper extremity when performing hair care, buttoning, securely holding flowers, cutting food, and manipulating/handling/sorting utensils from a drawer for setting a table, as well as adaptive and compensatory strategies during daily ADL and IADL care.   PERFORMANCE DEFICITS in functional skills including ADLs, IADLs, coordination, proprioception, ROM, strength, [place], and GMC, cognitive skills including memory, and psychosocial skills including coping strategies, environmental adaptation, interpersonal interactions, and routines and behaviors.    IMPAIRMENTS are limiting patient from ADLs, IADLs, education, leisure, and social participation.    COMORBIDITIES may have co-morbidities  that affects occupational performance. Patient will benefit from skilled OT to address above impairments and improve overall function.   MODIFICATION OR ASSISTANCE TO COMPLETE EVALUATION: Min-Moderate modification of tasks or assist with assess necessary to complete an evaluation.   OT OCCUPATIONAL PROFILE AND HISTORY: Detailed assessment: Review of records and additional review of physical, cognitive, psychosocial history related to current functional performance.   CLINICAL DECISION MAKING: Moderate - several treatment options, min-mod task modification necessary   REHAB POTENTIAL: Good    EVALUATION COMPLEXITY: Moderate      PLAN: OT FREQUENCY: 2x/week   OT DURATION: 12 weeks   PLANNED INTERVENTIONS: self care/ADL training, therapeutic exercise, therapeutic activity, neuromuscular re-education, manual therapy, passive range of motion, functional mobility training, electrical stimulation, and paraffin   RECOMMENDED OTHER SERVICES: PT   CONSULTED AND AGREED WITH PLAN OF CARE: Patient and family member/caregiver   PLAN FOR NEXT SESSION: Initiate OT treatment  Harrel Carina, MS, OTR/L   Harrel Carina, OT 01/07/2023, 9:22 AM

## 2023-01-08 ENCOUNTER — Ambulatory Visit: Payer: Medicare HMO | Attending: Physical Medicine and Rehabilitation | Admitting: Occupational Therapy

## 2023-01-08 ENCOUNTER — Encounter: Payer: Medicare HMO | Admitting: Speech Pathology

## 2023-01-08 ENCOUNTER — Ambulatory Visit: Payer: Medicare HMO

## 2023-01-08 DIAGNOSIS — M6281 Muscle weakness (generalized): Secondary | ICD-10-CM | POA: Insufficient documentation

## 2023-01-08 DIAGNOSIS — R278 Other lack of coordination: Secondary | ICD-10-CM | POA: Insufficient documentation

## 2023-01-09 NOTE — Therapy (Signed)
OUTPATIENT OCCUPATIONAL THERAPY NEURO TREATMENT  Patient Name: [name] MRN: [id number] DOB:1941-06-24, 82 y.o., female Today's Date: 01/09/2023  PCP: Dr. Doy Hutching REFERRING PROVIDER:  Dr. Doy Hutching  END OF SESSION:  OT End of Session - 01/09/23 0018     Visit Number 6    Number of Visits 24    Date for OT Re-Evaluation 03/10/23    Authorization Time Period Progress report period starting 09/09/2022    OT Start Time 32    OT Stop Time 1645    OT Time Calculation (min) 45 min    Activity Tolerance Patient limited by fatigue    Behavior During Therapy [facility] for tasks assessed/performed               Past Medical History:  Diagnosis Date   Anemia    Arthritis    Asthma    uses inhaler just prior to surgery to avoid attack   Back pain    from previous injury   Complication of anesthesia    has woken  up during 2 different surgery   Depression    no current issue/treatment; situation   Gallstones    GERD (gastroesophageal reflux disease)    Hiatal hernia    patient does NOT have nerve/muscle disease   History of kidney stones    HLD (hyperlipidemia)    HTN (hypertension)    Hypothyroidism    Kidney stones    Knee pain    Nausea and vomiting 10/15/2022   Non-diabetic pancreatic hormone dysfunction years   pt. 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   Pancreatitis    Pneumonia    Seizures ([place])    caused by dye injected during a procedure   Shortness of breath    with exertion   Sinus problem    frequent infections/congestion   Stroke ([place]) 2021   reports having CVA in 2021 and having mini strokes before that   Thyroid disease    Past Surgical History:  Procedure Laterality Date   ABDOMINAL HYSTERECTOMY     APPENDECTOMY     CARPAL TUNNEL RELEASE  10+ years ago   bilateral   EYE SURGERY  3 yrs ago   bilateral cataracts   FOOT OSTEOTOMY  6 weeks ago   Left foot: great, 2nd & 3rd   FOOT OSTEOTOMY  5 years ago   Right great toe   HAND SURGERY  Bilateral 2011-most recent   multiple hand surgeries, 2 on left, 3 on right   KNEE ARTHROPLASTY Right 04/28/2022   Procedure: COMPUTER ASSISTED TOTAL KNEE ARTHROPLASTY;  Surgeon: Dereck Leep, MD;  Location: [facility];  Service: Orthopedics;  Laterality: Right;   LOOP RECORDER INSERTION N/A 05/16/2020   Procedure: LOOP RECORDER INSERTION;  Surgeon: Isaias Cowman, MD;  Location: [place] [facility];  Service: Cardiovascular;  Laterality: N/A;   NASAL SINUS SURGERY  most recent 7-8 yrs ago   7 sinus surgeries    TRIGGER FINGER RELEASE  11/19/2011   Procedure: RELEASE TRIGGER FINGER/A-1 PULLEY;  Surgeon: Wynonia Sours, MD;  Location: [place];  Service: Orthopedics;  Laterality: Right;  release a-1 pulley right index finger and cyst removal   WRIST GANGLION EXCISION  1980's   right   Patient Active Problem List   Diagnosis Date Noted   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 11/27/2022   Hypomagnesemia 11/27/2022   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   HLD (hyperlipidemia) 11/23/2022   Asthma 11/23/2022  Depression with anxiety 11/23/2022   Chronic diastolic CHF (congestive heart failure) ([place]) 11/23/2022   Normocytic anemia 11/23/2022   Aspiration pneumonia ([place]) 11/23/2022   AKI (acute kidney injury) ([place]) 11/23/2022   Abdominal pain 11/23/2022   Mesenteric mass 11/23/2022   Coffee ground emesis 11/23/2022   Nausea and vomiting 10/15/2022   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   Fatigue 09/17/2022   Left hemiparesis ([place]) 09/17/2022   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[place]) 08/22/2022   GERD (gastroesophageal reflux disease) 08/21/2022   Agitation 08/20/2022   Acute left-sided weakness 08/20/2022   Expressive aphasia    Stroke ([place]) 08/19/2022   Leukocytosis 08/19/2022   History of urticaria 04/28/2022   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   Primary osteoarthritis of left knee 02/24/2022   Primary osteoarthritis of right knee  02/24/2022   Lumbar spondylolysis 04/12/2020   History of CVA (cerebrovascular accident) 03/26/2020   Low back pain radiating to right lower extremity 03/21/2020   B12 deficiency 03/06/2020   Positive anti-CCP test 12/21/2019   Arthralgia 12/13/2019   Dermatitis 12/13/2019   Rheumatoid factor positive 12/13/2019   Essential hypertension 12/11/2018   Palpitations 12/11/2018   Acquired hypothyroidism 11/10/2018   Arthritis of knee 09/17/2016   Anxiety 11/22/2014   Asthma without status asthmaticus 11/22/2014   Benign neoplasm of colon, unspecified 11/22/2014   Environmental allergies 11/22/2014   Hypertriglyceridemia 11/22/2014   Hypokalemia 11/22/2014   Personal history of disease of skin and subcutaneous tissue 11/22/2014   REFERRING DIAG: CVA   THERAPY DIAG:  Muscle weakness (generalized)   Other lack of coordination   Rationale for Evaluation and Treatment Rehabilitation   SUBJECTIVE:   Pt. Reports feeling well today, and that her skin tear is healing.   SUBJECTIVE STATEMENT:  Pt. Is requesting to make a change in her schedule on Thursday, the 8th due to participation in the the stoke support group in [place].   Pt accompanied by: significant other   PERTINENT HISTORY: Patient is an 82 year-old female who was [facility] to [facility] on 11/23/2022 with a cerebellar CVA. 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 Pt. Was admitted to Inpatient Rehab from 12/21-1/02/2023. 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  Patient underwent inpatient rehabilitation for 2 weeks.  Patient was assessed and was scheduled for a knee replacement on 08/29/2022 however had to cancel it due to having had a CVA.  Patient had a recent fall 2 days after discharging from inpatient rehab. Past Medical History includes: Knee replacement, essential HTN, hypokalemia, leukocytosis, seizures, positive anti-- CCP test,  anxiety disorder, mini strokes.  Patient had shingles with left eye nerve pain s/p 1 year ago.    PRECAUTIONS: Fall   WEIGHT BEARING RESTRICTIONS No   PAIN:  Are you having pain? left elbow/shoulder initially improved with ROM: NR   FALLS: Has patient fallen in last 6 months? Yes. Number of falls 1   LIVING ENVIRONMENT: Lives with: Lives with Spouse Lives in: House/apartment Stairs: 2 storey home, resides on the first floor.  External: 2 stairs front no rails, and 6 in back with rails Has following equipment at home: Single point cane, Walker - 2 wheeled, Environmental consultant - 4 wheeled, Shower bench, and bed side commode   PLOF: Independent   PATIENT GOALS  To Regain the use of her left arm   OBJECTIVE:    HAND DOMINANCE: Right   ADLs: Overall ADLs: Husband assists pt. as needed Transfers/ambulation related to ADLs:Pt. Uses  a 3 wheeled walker with Husband assist. Eating: Pt. Is independent with the right hand. Pt. has difficulty cutting food. Grooming: Pt. is using her right hand, however has difficulty sustaining her LUE in elevation to assist with haircare. UB Dressing: Pt. Is independent donning a pullover shirt, and button down shirt. Has difficulty with buttoning, LB Dressing:  Independent donning pants, and socks. Difficulty tying shoes. Toileting: Independent Bathing: Pt. Is able to engage her right hand. Tub Shower transfers: Supervision Equipment: See above for equipment     IADLs: Shopping:  Has not had the opportunity for grocery shopping yet Light housekeeping: Husband is assisting with light house keeping Meal Prep:  Dependent Community mobility: Relies of family/friends Medication management: Husband assisting with weekly pillbox set-up, and administering medication. Financial management: TBD Handwriting: 75% legible   MOBILITY STATUS: Hx of falls   POSTURE COMMENTS:  No Significant postural limitations Sitting balance: supported sitting balance WFL   ACTIVITY  TOLERANCE: Activity tolerance:  Fatigues in greater than 30 min.    FUNCTIONAL OUTCOME MEASURES: FOTO: 57   UPPER EXTREMITY ROM      Active ROM Right Eval: WFL Left eval  Shoulder flexion   132  Shoulder abduction   80  Shoulder adduction      Shoulder extension      Shoulder internal rotation      Shoulder external rotation      Elbow flexion   140  Elbow extension   WNL  Wrist flexion   65  Wrist extension   -10  Wrist ulnar deviation      Wrist radial deviation      Wrist pronation      Wrist supination      (Blank rows = not tested)   Left digit flexion to [facility]: 2nd: 0cm, 3rd: 0cm, 4th: 0cm, 5th: 0cm   Limited Left full 2nd digit extension     UPPER EXTREMITY MMT:      MMT Right Eval: 4+/5 overall Left eval  Shoulder flexion   3/5  Shoulder abduction   3-/5  Shoulder adduction      Shoulder extension      Shoulder internal rotation      Shoulder external rotation      Middle trapezius      Lower trapezius      Elbow flexion   3+/5  Elbow extension   3+/5  Wrist flexion      Wrist extension   2-/5  Wrist ulnar deviation      Wrist radial deviation      Wrist pronation      Wrist supination      (Blank rows = not tested)   HAND FUNCTION: Grip strength: Right: 26#, Left: 10# Pinch strength: Right 8#, Left: 3#, 3 Pt. Pinch strength: Right: 9#, L: 2#   COORDINATION: Right: 22 sec., Left: <5 min. To place 7 pegs with increased compensation proximally in the trunk, and through reflexive associated reactions.   SENSATION: Light touch: WFL, proprioceptive awareness: Intact   EDEMA: N/A   MUSCLE TONE: LUE: Hypotonic   COGNITION: Overall cognitive status: WFL for tasks assessed. Pt. Is impulsive at times.   VISION: Subjective report: Pt. report having shingles affecting left eye  s/p 1 year. Has nerve pain Baseline vision: Wears glasses for reading only Visual history:  updated see clinical impression   VISION ASSESSMENT:    WFL for tasks  performed   PERCEPTION: Intact   PRAXIS: Impaired: Motor planning  OBSERVATIONS:  Pt. More alert, and engaging since prior to the most recent hospitalization.     TODAY'S TREATMENT:    Self care:  Pt. worked on [employer] with emphasis placed on typing words utilizing letters primarily on the left side of the keyboard. Pt. Worked on creating lists of the words in a vertical column.   Pt. reports the area on her left elbow has scabbed over, and healing.  Pt. worked was able to initiate typing, occasionally isolating and alternating digit extension to press the letter keys. Pt. required visual cues to press the appropriate letter keys. Pt.'s 4th and 5th digits frequently lingered on the letter keys when the Pt. searched for the next letter causing multiple reps of the same letter to repeat itself. Patient continues to work on improving left shoulder elbow and wrist strength, improving motor control and coordination in order to prepare the left upper extremity and hand for functional reaching, and engaging the left upper extremity when performing hair care, buttoning, securely holding flowers, cutting food, and manipulating/handling/sorting utensils from a drawer for setting a table, as well as adaptive and compensatory strategies during daily ADL and IADL care.     PATIENT EDUCATION: Education details: left hand digit extension Person educated: Patient and Spouse Education method: Customer service manager Education comprehension: verbalized understanding, returned demonstration, and needs further education     HOME EXERCISE PROGRAM:   Assess ongoing need for HEP       GOALS: Goals reviewed with patient? Yes   SHORT TERM GOALS: Target date: 01/27/2023       1. Patient will be independent with home exercise program for the left upper extremity Baseline: No current home exercise program Goal status: INITIAL   2. Pt. Will improve active left 2nd digit extension to be able  able to isolate her 2nd digit in preparation for pressing/pushing buttons on appliances, phones, or remotes. Baseline: Is unable to actively full digit extension Goal status: INITIAL     LONG TERM GOALS: Target date: 03/10/2023     Patient will improve left shoulder strength by 2 mm grades to be able to sustain UE's in elevation long enough to wash her hair.  Baseline: Eval: Left shoulder flexion: 3/5, abduction: 3-/5 Goal status: INITIAL   2.  Patient will improve left shoulder active abduction to be able to comb her hair Baseline: Eval: Left shoulder abduction is 80(108) Goal status: INITIAL   3.  Patient will independently button her shirt with modified independence. Baseline: Eval: Patient requires has difficulty.  Goal status: INITIAL   4.  Patient well improve left grip strength preparation  for securely holding flowers. Baseline: Eval: Pt. Is unable to securely hold flowers. Goal status: INITIAL   5.  Pt. will independently recall adaptive  strategies for performing ADL tasks including: flossing teeth, donning bra, applying makeup. Baseline: Eval: Pt. to be provided with adaptive strategies. Goal status: INITIAL   6.  Pt. will improve Foto score by 2 points to reflect patient perceived performance improvement assessment specific ADLs  and IADLs Baseline: Eval: 57 Goal status: INITIAL  7.  Pt. will improve left hand coordination skills in order to be able to handle, and sort utensils in a drawer.     Baseline: Eval: Pt. has difficulty sorting, and placing utensils with the left hand. Left FMC : >5 min. For 7 pegs on the 9 hole peg test.    Goal Status: INITIAL       ASSESSMENT:  CLINICAL IMPRESSION:   Pt. reports the area on her left elbow has scabbed over, and healing.  Pt. worked was able to initiate typing, occasionally isolating and alternating digit extension to press the letter keys. Pt. required visual cues to press the appropriate letter keys. Pt.'s 4th and 5th  digits frequently lingered on the letter keys when the Pt. searched for the next letter causing multiple reps of the same letter to repeat itself. Patient continues to work on improving left shoulder elbow and wrist strength, improving motor control and coordination in order to prepare the left upper extremity and hand for functional reaching, and engaging the left upper extremity when performing hair care, buttoning, securely holding flowers, cutting food, and manipulating/handling/sorting utensils from a drawer for setting a table, as well as adaptive and compensatory strategies during daily ADL and IADL care.    PERFORMANCE DEFICITS in functional skills including ADLs, IADLs, coordination, proprioception, ROM, strength, [place], and GMC, cognitive skills including memory, and psychosocial skills including coping strategies, environmental adaptation, interpersonal interactions, and routines and behaviors.    IMPAIRMENTS are limiting patient from ADLs, IADLs, education, leisure, and social participation.    COMORBIDITIES may have co-morbidities  that affects occupational performance. Patient will benefit from skilled OT to address above impairments and improve overall function.   MODIFICATION OR ASSISTANCE TO COMPLETE EVALUATION: Min-Moderate modification of tasks or assist with assess necessary to complete an evaluation.   OT OCCUPATIONAL PROFILE AND HISTORY: Detailed assessment: Review of records and additional review of physical, cognitive, psychosocial history related to current functional performance.   CLINICAL DECISION MAKING: Moderate - several treatment options, min-mod task modification necessary   REHAB POTENTIAL: Good   EVALUATION COMPLEXITY: Moderate      PLAN: OT FREQUENCY: 2x/week   OT DURATION: 12 weeks   PLANNED INTERVENTIONS: self care/ADL training, therapeutic exercise, therapeutic activity, neuromuscular re-education, manual therapy, passive range of motion, functional  mobility training, electrical stimulation, and paraffin   RECOMMENDED OTHER SERVICES: PT   CONSULTED AND AGREED WITH PLAN OF CARE: Patient and family member/caregiver   PLAN FOR NEXT SESSION: Initiate OT treatment  Harrel Carina, MS, OTR/L   Harrel Carina, OT 01/09/2023, 12:35 AM

## 2023-01-13 ENCOUNTER — Ambulatory Visit: Payer: Medicare HMO | Admitting: Occupational Therapy

## 2023-01-13 ENCOUNTER — Ambulatory Visit: Payer: Medicare HMO

## 2023-01-13 ENCOUNTER — Encounter: Payer: Medicare HMO | Admitting: Speech Pathology

## 2023-01-13 DIAGNOSIS — R278 Other lack of coordination: Secondary | ICD-10-CM | POA: Diagnosis not present

## 2023-01-13 DIAGNOSIS — M6281 Muscle weakness (generalized): Secondary | ICD-10-CM | POA: Diagnosis not present

## 2023-01-13 NOTE — Therapy (Signed)
OUTPATIENT OCCUPATIONAL THERAPY NEURO TREATMENT  Patient Name: [name] MRN: [id number] DOB:May 24, 1941, 82 y.o., female Today's Date: 01/13/2023  PCP: Dr. Doy Hutching REFERRING PROVIDER:  Dr. Doy Hutching  END OF SESSION:  OT End of Session - 01/13/23 1731     Visit Number 7    Number of Visits 24    Date for OT Re-Evaluation 03/10/23    Authorization Time Period Progress report period starting 09/09/2022    OT Start Time 1645    OT Stop Time 1730    OT Time Calculation (min) 45 min    Activity Tolerance Patient limited by fatigue    Behavior During Therapy WFL for tasks assessed/performed               Past Medical History:  Diagnosis Date   Anemia    Arthritis    Asthma    uses inhaler just prior to surgery to avoid attack   Back pain    from previous injury   Complication of anesthesia    has woken  up during 2 different surgery   Depression    no current issue/treatment; situation   Gallstones    GERD (gastroesophageal reflux disease)    Hiatal hernia    patient does NOT have nerve/muscle disease   History of kidney stones    HLD (hyperlipidemia)    HTN (hypertension)    Hypothyroidism    Kidney stones    Knee pain    Nausea and vomiting 10/15/2022   Non-diabetic pancreatic hormone dysfunction years   pt. 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   Pancreatitis    Pneumonia    Seizures ([place])    caused by dye injected during a procedure   Shortness of breath    with exertion   Sinus problem    frequent infections/congestion   Stroke ([place]) 2021   reports having CVA in 2021 and having mini strokes before that   Thyroid disease    Past Surgical History:  Procedure Laterality Date   ABDOMINAL HYSTERECTOMY     APPENDECTOMY     CARPAL TUNNEL RELEASE  10+ years ago   bilateral   EYE SURGERY  3 yrs ago   bilateral cataracts   FOOT OSTEOTOMY  6 weeks ago   Left foot: great, 2nd & 3rd   FOOT OSTEOTOMY  5 years ago   Right great toe   HAND SURGERY  Bilateral 2011-most recent   multiple hand surgeries, 2 on left, 3 on right   KNEE ARTHROPLASTY Right 04/28/2022   Procedure: COMPUTER ASSISTED TOTAL KNEE ARTHROPLASTY;  Surgeon: Dereck Leep, MD;  Location: [facility];  Service: Orthopedics;  Laterality: Right;   LOOP RECORDER INSERTION N/A 05/16/2020   Procedure: LOOP RECORDER INSERTION;  Surgeon: Isaias Cowman, MD;  Location: [facility];  Service: Cardiovascular;  Laterality: N/A;   NASAL SINUS SURGERY  most recent 7-8 yrs ago   7 sinus surgeries    TRIGGER FINGER RELEASE  11/19/2011   Procedure: RELEASE TRIGGER FINGER/A-1 PULLEY;  Surgeon: Wynonia Sours, MD;  Location: [place];  Service: Orthopedics;  Laterality: Right;  release a-1 pulley right index finger and cyst removal   WRIST GANGLION EXCISION  1980's   right   Patient Active Problem List   Diagnosis Date Noted   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 11/27/2022   Hypomagnesemia 11/27/2022   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   HLD (hyperlipidemia) 11/23/2022   Asthma 11/23/2022  Depression with anxiety 11/23/2022   Chronic diastolic CHF (congestive heart failure) ([place]) 11/23/2022   Normocytic anemia 11/23/2022   Aspiration pneumonia ([place]) 11/23/2022   AKI (acute kidney injury) ([place]) 11/23/2022   Abdominal pain 11/23/2022   Mesenteric mass 11/23/2022   Coffee ground emesis 11/23/2022   Nausea and vomiting 10/15/2022   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   Fatigue 09/17/2022   Left hemiparesis ([place]) 09/17/2022   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[place]) 08/22/2022   GERD (gastroesophageal reflux disease) 08/21/2022   Agitation 08/20/2022   Acute left-sided weakness 08/20/2022   Expressive aphasia    Stroke ([place]) 08/19/2022   Leukocytosis 08/19/2022   History of urticaria 04/28/2022   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   Primary osteoarthritis of left knee 02/24/2022   Primary osteoarthritis of right knee  02/24/2022   Lumbar spondylolysis 04/12/2020   History of CVA (cerebrovascular accident) 03/26/2020   Low back pain radiating to right lower extremity 03/21/2020   B12 deficiency 03/06/2020   Positive anti-CCP test 12/21/2019   Arthralgia 12/13/2019   Dermatitis 12/13/2019   Rheumatoid factor positive 12/13/2019   Essential hypertension 12/11/2018   Palpitations 12/11/2018   Acquired hypothyroidism 11/10/2018   Arthritis of knee 09/17/2016   Anxiety 11/22/2014   Asthma without status asthmaticus 11/22/2014   Benign neoplasm of colon, unspecified 11/22/2014   Environmental allergies 11/22/2014   Hypertriglyceridemia 11/22/2014   Hypokalemia 11/22/2014   Personal history of disease of skin and subcutaneous tissue 11/22/2014   REFERRING DIAG: CVA   THERAPY DIAG:  Muscle weakness (generalized)   Other lack of coordination   Rationale for Evaluation and Treatment Rehabilitation   SUBJECTIVE:   Pt. doing well today.   SUBJECTIVE STATEMENT:  Pt. Is requesting to make a change in her schedule on Thursday, the 8th due to participation in the the stoke support group in [place].   Pt accompanied by: significant other   PERTINENT HISTORY: Patient is an 82 year-old female who was admitted to [facility] on 11/23/2022 with a cerebellar CVA. 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 Pt. Was admitted to Inpatient Rehab from 12/21-1/02/2023. 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  Patient underwent inpatient rehabilitation for 2 weeks.  Patient was assessed and was scheduled for a knee replacement on 08/29/2022 however had to cancel it due to having had a CVA.  Patient had a recent fall 2 days after discharging from inpatient rehab. Past Medical History includes: Knee replacement, essential HTN, hypokalemia, leukocytosis, seizures, positive anti-- CCP test, anxiety disorder, mini strokes.  Patient had  shingles with left eye nerve pain s/p 1 year ago.    PRECAUTIONS: Fall   WEIGHT BEARING RESTRICTIONS No   PAIN:  Are you having pain? left elbow/shoulder initially improved with ROM: NR   FALLS: Has patient fallen in last 6 months? Yes. Number of falls 1   LIVING ENVIRONMENT: Lives with: Lives with Spouse Lives in: House/apartment Stairs: 2 storey home, resides on the first floor.  External: 2 stairs front no rails, and 6 in back with rails Has following equipment at home: Single point cane, Walker - 2 wheeled, Environmental consultant - 4 wheeled, Shower bench, and bed side commode   PLOF: Independent   PATIENT GOALS  To Regain the use of her left arm   OBJECTIVE:    HAND DOMINANCE: Right   ADLs: Overall ADLs: Husband assists pt. as needed Transfers/ambulation related to ADLs:Pt. Uses a 3 wheeled walker with Husband assist. Eating:  Pt. Is independent with the right hand. Pt. has difficulty cutting food. Grooming: Pt. is using her right hand, however has difficulty sustaining her LUE in elevation to assist with haircare. UB Dressing: Pt. Is independent donning a pullover shirt, and button down shirt. Has difficulty with buttoning, LB Dressing:  Independent donning pants, and socks. Difficulty tying shoes. Toileting: Independent Bathing: Pt. Is able to engage her right hand. Tub Shower transfers: Supervision Equipment: See above for equipment     IADLs: Shopping:  Has not had the opportunity for grocery shopping yet Light housekeeping: Husband is assisting with light house keeping Meal Prep:  Dependent Community mobility: Relies of family/friends Medication management: Husband assisting with weekly pillbox set-up, and administering medication. Financial management: TBD Handwriting: 75% legible   MOBILITY STATUS: Hx of falls   POSTURE COMMENTS:  No Significant postural limitations Sitting balance: supported sitting balance WFL   ACTIVITY TOLERANCE: Activity tolerance:  Fatigues in  greater than 30 min.    FUNCTIONAL OUTCOME MEASURES: FOTO: 57   UPPER EXTREMITY ROM      Active ROM Right Eval: WFL Left eval  Shoulder flexion   132  Shoulder abduction   80  Shoulder adduction      Shoulder extension      Shoulder internal rotation      Shoulder external rotation      Elbow flexion   140  Elbow extension   WNL  Wrist flexion   65  Wrist extension   -10  Wrist ulnar deviation      Wrist radial deviation      Wrist pronation      Wrist supination      (Blank rows = not tested)   Left digit flexion to [facility]: 2nd: 0cm, 3rd: 0cm, 4th: 0cm, 5th: 0cm   Limited Left full 2nd digit extension     UPPER EXTREMITY MMT:      MMT Right Eval: 4+/5 overall Left eval  Shoulder flexion   3/5  Shoulder abduction   3-/5  Shoulder adduction      Shoulder extension      Shoulder internal rotation      Shoulder external rotation      Middle trapezius      Lower trapezius      Elbow flexion   3+/5  Elbow extension   3+/5  Wrist flexion      Wrist extension   2-/5  Wrist ulnar deviation      Wrist radial deviation      Wrist pronation      Wrist supination      (Blank rows = not tested)   HAND FUNCTION: Grip strength: Right: 26#, Left: 10# Pinch strength: Right 8#, Left: 3#, 3 Pt. Pinch strength: Right: 9#, L: 2#   COORDINATION: Right: 22 sec., Left: <5 min. To place 7 pegs with increased compensation proximally in the trunk, and through reflexive associated reactions.   SENSATION: Light touch: WFL, proprioceptive awareness: Intact   EDEMA: N/A   MUSCLE TONE: LUE: Hypotonic   COGNITION: Overall cognitive status: WFL for tasks assessed. Pt. Is impulsive at times.   VISION: Subjective report: Pt. report having shingles affecting left eye  s/p 1 year. Has nerve pain Baseline vision: Wears glasses for reading only Visual history:  updated see clinical impression   VISION ASSESSMENT:    WFL for tasks performed   PERCEPTION: Intact   PRAXIS:  Impaired: Motor planning   OBSERVATIONS:  Pt. More alert, and engaging since  prior to the most recent hospitalization.     TODAY'S TREATMENT:    Self care:  Pt. Worked on visual scanning tasks to determine how visual search strategies may be affecting ADL functioning during tabletop tasks. Pt. Was able to complete visual scanning tasks  utilizing  horizontal left to right visual search strategies, numbering items positioned in a random position, and identifying, and finding items placed in an random unorganized fashion. Pt. Worked on visual search strategies for words, however was unable to accurately follow the directions, and had difficulty locating the 2 and 3 letter words in the search. Pt. worked on [employer] with emphasis placed on typing words utilizing he left hand on the keyboard. Pt. Worked on creating lists of the words in a vertical column.  Pt. was able to consistently initiate isolating and alternating digit extension to press the letter keys with the left hand today. Pt. Continues to  require visual cues to press the appropriate letter keys. Pt. Presented less 4th and 5th digit lingering on the letters, and was able to efficiently lift, and extend the digits off of the letters today. Pt. Required cues for encouragement to engage the LUE more during typing. Patient continues to work on improving left shoulder elbow and wrist strength, improving motor control and coordination in order to prepare the left upper extremity and hand for functional reaching, and engaging the left upper extremity when performing hair care, buttoning, securely holding flowers, cutting food, and manipulating/handling/sorting utensils from a drawer for setting a table, as well as adaptive and compensatory strategies during daily ADL and IADL care.     PATIENT EDUCATION: Education details: left hand digit extension Person educated: Patient and Spouse Education method: Customer service manager Education  comprehension: verbalized understanding, returned demonstration, and needs further education     HOME EXERCISE PROGRAM:   Assess ongoing need for HEP       GOALS: Goals reviewed with patient? Yes   SHORT TERM GOALS: Target date: 01/27/2023       1. Patient will be independent with home exercise program for the left upper extremity Baseline: No current home exercise program Goal status: INITIAL   2. Pt. Will improve active left 2nd digit extension to be able able to isolate her 2nd digit in preparation for pressing/pushing buttons on appliances, phones, or remotes. Baseline: Is unable to actively full digit extension Goal status: INITIAL     LONG TERM GOALS: Target date: 03/10/2023     Patient will improve left shoulder strength by 2 mm grades to be able to sustain UE's in elevation long enough to wash her hair.  Baseline: Eval: Left shoulder flexion: 3/5, abduction: 3-/5 Goal status: INITIAL   2.  Patient will improve left shoulder active abduction to be able to comb her hair Baseline: Eval: Left shoulder abduction is 80(108) Goal status: INITIAL   3.  Patient will independently button her shirt with modified independence. Baseline: Eval: Patient requires has difficulty.  Goal status: INITIAL   4.  Patient well improve left grip strength preparation  for securely holding flowers. Baseline: Eval: Pt. Is unable to securely hold flowers. Goal status: INITIAL   5.  Pt. will independently recall adaptive  strategies for performing ADL tasks including: flossing teeth, donning bra, applying makeup. Baseline: Eval: Pt. to be provided with adaptive strategies. Goal status: INITIAL   6.  Pt. will improve Foto score by 2 points to reflect patient perceived performance improvement assessment specific ADLs  and IADLs Baseline:  Eval: 57 Goal status: INITIAL  7.  Pt. will improve left hand coordination skills in order to be able to handle, and sort utensils in a drawer.      Baseline: Eval: Pt. has difficulty sorting, and placing utensils with the left hand. Left FMC : >5 min. For 7 pegs on the 9 hole peg test.    Goal Status: INITIAL       ASSESSMENT:   CLINICAL IMPRESSION:   Pt. was able to consistently initiate isolating and alternating digit extension to press the letter keys with the left hand today. Pt. Continues to  require visual cues to press the appropriate letter keys. Pt. Presented less 4th and 5th digit lingering on the letters, and was able to efficiently lift, and extend the digits off of the letters today. Pt. Required cues for encouragement to engage the LUE more during typing. Patient continues to work on improving left shoulder elbow and wrist strength, improving motor control and coordination in order to prepare the left upper extremity and hand for functional reaching, and engaging the left upper extremity when performing hair care, buttoning, securely holding flowers, cutting food, and manipulating/handling/sorting utensils from a drawer for setting a table, as well as adaptive and compensatory strategies during daily ADL and IADL care.     PERFORMANCE DEFICITS in functional skills including ADLs, IADLs, coordination, proprioception, ROM, strength, [place], and GMC, cognitive skills including memory, and psychosocial skills including coping strategies, environmental adaptation, interpersonal interactions, and routines and behaviors.    IMPAIRMENTS are limiting patient from ADLs, IADLs, education, leisure, and social participation.    COMORBIDITIES may have co-morbidities  that affects occupational performance. Patient will benefit from skilled OT to address above impairments and improve overall function.   MODIFICATION OR ASSISTANCE TO COMPLETE EVALUATION: Min-Moderate modification of tasks or assist with assess necessary to complete an evaluation.   OT OCCUPATIONAL PROFILE AND HISTORY: Detailed assessment: Review of records and additional review  of physical, cognitive, psychosocial history related to current functional performance.   CLINICAL DECISION MAKING: Moderate - several treatment options, min-mod task modification necessary   REHAB POTENTIAL: Good   EVALUATION COMPLEXITY: Moderate      PLAN: OT FREQUENCY: 2x/week   OT DURATION: 12 weeks   PLANNED INTERVENTIONS: self care/ADL training, therapeutic exercise, therapeutic activity, neuromuscular re-education, manual therapy, passive range of motion, functional mobility training, electrical stimulation, and paraffin   RECOMMENDED OTHER SERVICES: PT   CONSULTED AND AGREED WITH PLAN OF CARE: Patient and family member/caregiver   PLAN FOR NEXT SESSION: Initiate OT treatment  Harrel Carina, MS, OTR/L   Harrel Carina, OT 01/13/2023, 5:37 PM

## 2023-01-15 ENCOUNTER — Ambulatory Visit: Payer: Medicare HMO

## 2023-01-15 ENCOUNTER — Ambulatory Visit: Payer: Medicare HMO | Admitting: Occupational Therapy

## 2023-01-15 ENCOUNTER — Encounter: Payer: Medicare HMO | Admitting: Speech Pathology

## 2023-01-15 DIAGNOSIS — R278 Other lack of coordination: Secondary | ICD-10-CM | POA: Diagnosis not present

## 2023-01-15 DIAGNOSIS — M6281 Muscle weakness (generalized): Secondary | ICD-10-CM | POA: Diagnosis not present

## 2023-01-15 NOTE — Therapy (Signed)
OUTPATIENT OCCUPATIONAL THERAPY NEURO TREATMENT  Patient Name: [name] MRN: [id number] DOB:Dec 02, 1941, 82 y.o., female Today's Date: 01/15/2023  PCP: Dr. Doy Hutching REFERRING PROVIDER:  Dr. Doy Hutching  END OF SESSION:  OT End of Session - 01/15/23 1441     Visit Number 8    Number of Visits 24    Date for OT Re-Evaluation 03/10/23    Authorization Time Period Progress report period starting 09/09/2022    OT Start Time 19    OT Stop Time 1345    OT Time Calculation (min) 45 min    Activity Tolerance Patient limited by fatigue    Behavior During Therapy [facility] for tasks assessed/performed               Past Medical History:  Diagnosis Date   Anemia    Arthritis    Asthma    uses inhaler just prior to surgery to avoid attack   Back pain    from previous injury   Complication of anesthesia    has woken  up during 2 different surgery   Depression    no current issue/treatment; situation   Gallstones    GERD (gastroesophageal reflux disease)    Hiatal hernia    patient does NOT have nerve/muscle disease   History of kidney stones    HLD (hyperlipidemia)    HTN (hypertension)    Hypothyroidism    Kidney stones    Knee pain    Nausea and vomiting 10/15/2022   Non-diabetic pancreatic hormone dysfunction years   pt. 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   Pancreatitis    Pneumonia    Seizures ([place])    caused by dye injected during a procedure   Shortness of breath    with exertion   Sinus problem    frequent infections/congestion   Stroke ([place]) 2021   reports having CVA in 2021 and having mini strokes before that   Thyroid disease    Past Surgical History:  Procedure Laterality Date   ABDOMINAL HYSTERECTOMY     APPENDECTOMY     CARPAL TUNNEL RELEASE  10+ years ago   bilateral   EYE SURGERY  3 yrs ago   bilateral cataracts   FOOT OSTEOTOMY  6 weeks ago   Left foot: great, 2nd & 3rd   FOOT OSTEOTOMY  5 years ago   Right great toe   HAND SURGERY  Bilateral 2011-most recent   multiple hand surgeries, 2 on left, 3 on right   KNEE ARTHROPLASTY Right 04/28/2022   Procedure: COMPUTER ASSISTED TOTAL KNEE ARTHROPLASTY;  Surgeon: Dereck Leep, MD;  Location: [facility] OR[facility];  Service: Orthopedics;  Laterality: Right;   LOOP RECORDER INSERTION N/A 05/16/2020   Procedure: LOOP RECORDER INSERTION;  Surgeon: Isaias Cowman, MD;  Location: [place] [facility];  Service: Cardiovascular;  Laterality: N/A;   NASAL SINUS SURGERY  most recent 7-8 yrs ago   7 sinus surgeries    TRIGGER FINGER RELEASE  11/19/2011   Procedure: RELEASE TRIGGER FINGER/A-1 PULLEY;  Surgeon: Wynonia Sours, MD;  Location: [place];  Service: Orthopedics;  Laterality: Right;  release a-1 pulley right index finger and cyst removal   WRIST GANGLION EXCISION  1980's   right   Patient Active Problem List   Diagnosis Date Noted   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[place]) 08/22/2022   GERD (gastroesophageal reflux disease) 08/21/2022   Agitation 08/20/2022   Acute left-sided weakness 08/20/2022   Expressive aphasia    Stroke ([place]) 08/19/2022   Leukocytosis 08/19/2022   History of urticaria 04/28/2022   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   Primary osteoarthritis of left knee 02/24/2022   Primary osteoarthritis of right knee  02/24/2022   Lumbar spondylolysis 04/12/2020   History of CVA (cerebrovascular accident) 03/26/2020   Low back pain radiating to right lower extremity 03/21/2020   B12 deficiency 03/06/2020   Positive anti-CCP test 12/21/2019   Arthralgia 12/13/2019   Dermatitis 12/13/2019   Rheumatoid factor positive 12/13/2019   Essential hypertension 12/11/2018   Palpitations 12/11/2018   Acquired hypothyroidism 11/10/2018   Arthritis of knee 09/17/2016   Anxiety 11/22/2014   Asthma without status asthmaticus 11/22/2014   Benign neoplasm of colon, unspecified 11/22/2014   Environmental allergies 11/22/2014   Hypertriglyceridemia 11/22/2014   Hypokalemia 11/22/2014   Personal history of disease of skin and subcutaneous tissue 11/22/2014   REFERRING DIAG: CVA   THERAPY DIAG:  Muscle weakness (generalized)   Other lack of coordination   Rationale for Evaluation and Treatment Rehabilitation   SUBJECTIVE:   Pt. doing well today.   SUBJECTIVE STATEMENT:  Pt./caregiver are excited to learn more about Vivist[facility] being presented by Dr. Dianna Limbo at the Stroke support group in [place].    Pt accompanied by: significant other   PERTINENT HISTORY: Patient is an 82 year-old female who was admitted to [facility] on 11/23/2022 with a cerebellar CVA. 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 Pt. Was admitted to Inpatient Rehab from 12/21-1/02/2023. 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  Patient underwent inpatient rehabilitation for 2 weeks.  Patient was assessed and was scheduled for a knee replacement on 08/29/2022 however had to cancel it due to having had a CVA.  Patient had a recent fall 2 days after discharging from inpatient rehab. Past Medical History includes: Knee replacement, essential HTN, hypokalemia, leukocytosis, seizures, positive anti-- CCP test, anxiety disorder, mini strokes.  Patient had shingles  with left eye nerve pain s/p 1 year ago.    PRECAUTIONS: Fall   WEIGHT BEARING RESTRICTIONS No   PAIN:  Are you having pain? left elbow/shoulder initially improved with ROM: NR   FALLS: Has patient fallen in last 6 months? Yes. Number of falls 1   LIVING ENVIRONMENT: Lives with: Lives with Spouse Lives in: House/apartment Stairs: 2 storey home, resides on the first floor.  External: 2 stairs front no rails, and 6 in back with rails Has following equipment at home: Single point cane, Walker - 2 wheeled, Environmental consultant - 4 wheeled, Shower bench, and bed side commode   PLOF: Independent   PATIENT GOALS  To Regain the use of her left arm   OBJECTIVE:    HAND DOMINANCE: Right   ADLs: Overall ADLs: Husband assists pt. as needed Transfers/ambulation related to ADLs:Pt. Uses a 3 wheeled walker with Husband assist. Eating: Pt. Is independent with  the right hand. Pt. has difficulty cutting food. Grooming: Pt. is using her right hand, however has difficulty sustaining her LUE in elevation to assist with haircare. UB Dressing: Pt. Is independent donning a pullover shirt, and button down shirt. Has difficulty with buttoning, LB Dressing:  Independent donning pants, and socks. Difficulty tying shoes. Toileting: Independent Bathing: Pt. Is able to engage her right hand. Tub Shower transfers: Supervision Equipment: See above for equipment     IADLs: Shopping:  Has not had the opportunity for grocery shopping yet Light housekeeping: Husband is assisting with light house keeping Meal Prep:  Dependent Community mobility: Relies of family/friends Medication management: Husband assisting with weekly pillbox set-up, and administering medication. Financial management: TBD Handwriting: 75% legible   MOBILITY STATUS: Hx of falls   POSTURE COMMENTS:  No Significant postural limitations Sitting balance: supported sitting balance WFL   ACTIVITY TOLERANCE: Activity tolerance:  Fatigues in greater  than 30 min.    FUNCTIONAL OUTCOME MEASURES: FOTO: 57   UPPER EXTREMITY ROM      Active ROM Right Eval: WFL Left eval  Shoulder flexion   132  Shoulder abduction   80  Shoulder adduction      Shoulder extension      Shoulder internal rotation      Shoulder external rotation      Elbow flexion   140  Elbow extension   WNL  Wrist flexion   65  Wrist extension   -10  Wrist ulnar deviation      Wrist radial deviation      Wrist pronation      Wrist supination      (Blank rows = not tested)   Left digit flexion to [facility]: 2nd: 0cm, 3rd: 0cm, 4th: 0cm, 5th: 0cm   Limited Left full 2nd digit extension     UPPER EXTREMITY MMT:      MMT Right Eval: 4+/5 overall Left eval  Shoulder flexion   3/5  Shoulder abduction   3-/5  Shoulder adduction      Shoulder extension      Shoulder internal rotation      Shoulder external rotation      Middle trapezius      Lower trapezius      Elbow flexion   3+/5  Elbow extension   3+/5  Wrist flexion      Wrist extension   2-/5  Wrist ulnar deviation      Wrist radial deviation      Wrist pronation      Wrist supination      (Blank rows = not tested)   HAND FUNCTION: Grip strength: Right: 26#, Left: 10# Pinch strength: Right 8#, Left: 3#, 3 Pt. Pinch strength: Right: 9#, L: 2#   COORDINATION: Right: 22 sec., Left: <5 min. To place 7 pegs with increased compensation proximally in the trunk, and through reflexive associated reactions.   SENSATION: Light touch: WFL, proprioceptive awareness: Intact   EDEMA: N/A   MUSCLE TONE: LUE: Hypotonic   COGNITION: Overall cognitive status: WFL for tasks assessed. Pt. Is impulsive at times.   VISION: Subjective report: Pt. report having shingles affecting left eye  s/p 1 year. Has nerve pain Baseline vision: Wears glasses for reading only Visual history:  updated see clinical impression   VISION ASSESSMENT:    WFL for tasks performed   PERCEPTION: Intact   PRAXIS: Impaired: Motor  planning   OBSERVATIONS:  Pt. More alert, and engaging since prior to the most  recent hospitalization.     TODAY'S TREATMENT:    Self care:  Pt. Worked on left hand grasping one inch resistive cubes from the table  using  thumb opposition to the tip of the 2nd digit. The board was positioned at a vertical angle to promote wrist extension. Pt. worked on pressing them back into place using isolated 2nd digit extension while flexing the 3rd, 4th, and 5th digits. Pt. worked on grasping 1/2" circular tipped pegs with the left wrist extended. Pt. Worked on placing them onto a goal directed target placing them into the pegboard, then isolating 2nd digit extension, and pressing them into place.   Pt. continues to be very motivated for therapy. Pt. presented with increased coughing during the session. Pt/care giver reports the Pt. has reflux. Pt. Is improving with insolating the 2nd digit into extension independent of the other digits. Pt. does compensate proximally with the right hand, and right LE when focusing on using the left hand. Pt. requires alternating proprioception with the left hand to inhibit tone in the 2nd digit. Pt. Was able to grasp the small pegs when stabilized upright, however had difficulty grasping them form the tabletop surface. Patient continues to work on improving left shoulder elbow and wrist strength, improving motor control and coordination in order to prepare the left upper extremity and hand for functional reaching, and engaging the left upper extremity when performing hair care, buttoning, securely holding flowers, cutting food, and manipulating/handling/sorting utensils from a drawer for setting a table, as well as adaptive and compensatory strategies during daily ADL and IADL care.     PATIENT EDUCATION: Education details: left hand digit extension Person educated: Patient and Spouse Education method: Customer service manager Education comprehension: verbalized  understanding, returned demonstration, and needs further education     HOME EXERCISE PROGRAM:    Reviewed activities at home to promote isolated 2nd digit extension.       GOALS: Goals reviewed with patient? Yes   SHORT TERM GOALS: Target date: 01/27/2023       1. Patient will be independent with home exercise program for the left upper extremity Baseline: No current home exercise program Goal status: INITIAL   2. Pt. Will improve active left 2nd digit extension to be able able to isolate her 2nd digit in preparation for pressing/pushing buttons on appliances, phones, or remotes. Baseline: Is unable to actively full digit extension Goal status: INITIAL     LONG TERM GOALS: Target date: 03/10/2023     Patient will improve left shoulder strength by 2 mm grades to be able to sustain UE's in elevation long enough to wash her hair.  Baseline: Eval: Left shoulder flexion: 3/5, abduction: 3-/5 Goal status: INITIAL   2.  Patient will improve left shoulder active abduction to be able to comb her hair Baseline: Eval: Left shoulder abduction is 80(108) Goal status: INITIAL   3.  Patient will independently button her shirt with modified independence. Baseline: Eval: Patient requires has difficulty.  Goal status: INITIAL   4.  Patient well improve left grip strength preparation  for securely holding flowers. Baseline: Eval: Pt. Is unable to securely hold flowers. Goal status: INITIAL   5.  Pt. will independently recall adaptive  strategies for performing ADL tasks including: flossing teeth, donning bra, applying makeup. Baseline: Eval: Pt. to be provided with adaptive strategies. Goal status: INITIAL   6.  Pt. will improve Foto score by 2 points to reflect patient perceived performance improvement assessment specific ADLs  and IADLs Baseline: Eval: 57 Goal status: INITIAL  7.  Pt. will improve left hand coordination skills in order to be able to handle, and sort utensils in a  drawer.     Baseline: Eval: Pt. has difficulty sorting, and placing utensils with the left hand. Left FMC : >5 min. For 7 pegs on the 9 hole peg test.    Goal Status: INITIAL       ASSESSMENT:   CLINICAL IMPRESSION:  Pt. continues to be very motivated for therapy. Pt. presented with increased coughing during the session. Pt/care giver reports the Pt. has reflux. Pt. Is improving with insolating the 2nd digit into extension independent of the other digits. Pt. does compensate proximally with the right hand, and right LE when focusing on using the left hand. Pt. requires alternating proprioception with the left hand to inhibit tone in the 2nd digit. Pt. Was able to grasp the small pegs when stabilized upright, however had difficulty grasping them form the tabletop surface. Patient continues to work on improving left shoulder elbow and wrist strength, improving motor control and coordination in order to prepare the left upper extremity and hand for functional reaching, and engaging the left upper extremity when performing hair care, buttoning, securely holding flowers, cutting food, and manipulating/handling/sorting utensils from a drawer for setting a table, as well as adaptive and compensatory strategies during daily ADL and IADL care.     PERFORMANCE DEFICITS in functional skills including ADLs, IADLs, coordination, proprioception, ROM, strength, [place], and GMC, cognitive skills including memory, and psychosocial skills including coping strategies, environmental adaptation, interpersonal interactions, and routines and behaviors.    IMPAIRMENTS are limiting patient from ADLs, IADLs, education, leisure, and social participation.    COMORBIDITIES may have co-morbidities  that affects occupational performance. Patient will benefit from skilled OT to address above impairments and improve overall function.   MODIFICATION OR ASSISTANCE TO COMPLETE EVALUATION: Min-Moderate modification of tasks or assist  with assess necessary to complete an evaluation.   OT OCCUPATIONAL PROFILE AND HISTORY: Detailed assessment: Review of records and additional review of physical, cognitive, psychosocial history related to current functional performance.   CLINICAL DECISION MAKING: Moderate - several treatment options, min-mod task modification necessary   REHAB POTENTIAL: Good   EVALUATION COMPLEXITY: Moderate      PLAN: OT FREQUENCY: 2x/week   OT DURATION: 12 weeks   PLANNED INTERVENTIONS: self care/ADL training, therapeutic exercise, therapeutic activity, neuromuscular re-education, manual therapy, passive range of motion, functional mobility training, electrical stimulation, and paraffin   RECOMMENDED OTHER SERVICES: PT   CONSULTED AND AGREED WITH PLAN OF CARE: Patient and family member/caregiver   PLAN FOR NEXT SESSION: Initiate OT treatment  Harrel Carina, MS, OTR/L   Harrel Carina, OT 01/15/2023, 2:43 PM

## 2023-01-20 ENCOUNTER — Encounter: Payer: Medicare HMO | Admitting: Speech Pathology

## 2023-01-20 ENCOUNTER — Ambulatory Visit: Payer: Medicare HMO | Admitting: Occupational Therapy

## 2023-01-20 ENCOUNTER — Ambulatory Visit: Payer: Medicare HMO

## 2023-01-20 DIAGNOSIS — M6281 Muscle weakness (generalized): Secondary | ICD-10-CM | POA: Diagnosis not present

## 2023-01-20 DIAGNOSIS — R278 Other lack of coordination: Secondary | ICD-10-CM | POA: Diagnosis not present

## 2023-01-21 ENCOUNTER — Encounter: Payer: Self-pay | Admitting: Occupational Therapy

## 2023-01-21 NOTE — Therapy (Signed)
OUTPATIENT OCCUPATIONAL THERAPY NEURO TREATMENT  Patient Name: [name] MRN: [id number] DOB:06-25-1941, 82 y.o., female Today's Date: 01/21/2023  PCP: Dr. Doy Hutching REFERRING PROVIDER:  Dr. Doy Hutching  END OF SESSION:  OT End of Session - 01/21/23 0853     Visit Number 9    Number of Visits 24    Date for OT Re-Evaluation 03/10/23    Authorization Time Period Progress report period starting 12/16/2022    OT Start Time 1645    OT Stop Time 1730    OT Time Calculation (min) 45 min    Activity Tolerance Patient limited by fatigue    Behavior During Therapy WFL for tasks assessed/performed               Past Medical History:  Diagnosis Date   Anemia    Arthritis    Asthma    uses inhaler just prior to surgery to avoid attack   Back pain    from previous injury   Complication of anesthesia    has woken  up during 2 different surgery   Depression    no current issue/treatment; situation   Gallstones    GERD (gastroesophageal reflux disease)    Hiatal hernia    patient does NOT have nerve/muscle disease   History of kidney stones    HLD (hyperlipidemia)    HTN (hypertension)    Hypothyroidism    Kidney stones    Knee pain    Nausea and vomiting 10/15/2022   Non-diabetic pancreatic hormone dysfunction years   pt. 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   Pancreatitis    Pneumonia    Seizures (HCC)    caused by dye injected during a procedure   Shortness of breath    with exertion   Sinus problem    frequent infections/congestion   Stroke ([place]) 2021   reports having CVA in 2021 and having mini strokes before that   Thyroid disease    Past Surgical History:  Procedure Laterality Date   ABDOMINAL HYSTERECTOMY     APPENDECTOMY     CARPAL TUNNEL RELEASE  10+ years ago   bilateral   EYE SURGERY  3 yrs ago   bilateral cataracts   FOOT OSTEOTOMY  6 weeks ago   Left foot: great, 2nd & 3rd   FOOT OSTEOTOMY  5 years ago   Right great toe   HAND SURGERY  Bilateral 2011-most recent   multiple hand surgeries, 2 on left, 3 on right   KNEE ARTHROPLASTY Right 04/28/2022   Procedure: COMPUTER ASSISTED TOTAL KNEE ARTHROPLASTY;  Surgeon: Dereck Leep, MD;  Location: [facility];  Service: Orthopedics;  Laterality: Right;   LOOP RECORDER INSERTION N/A 05/16/2020   Procedure: LOOP RECORDER INSERTION;  Surgeon: Isaias Cowman, MD;  Location: [facility];  Service: Cardiovascular;  Laterality: N/A;   NASAL SINUS SURGERY  most recent 7-8 yrs ago   7 sinus surgeries    TRIGGER FINGER RELEASE  11/19/2011   Procedure: RELEASE TRIGGER FINGER/A-1 PULLEY;  Surgeon: Wynonia Sours, MD;  Location: [place] [facility];  Service: Orthopedics;  Laterality: Right;  release a-1 pulley right index finger and cyst removal   WRIST GANGLION EXCISION  1980's   right   Patient Active Problem List   Diagnosis Date Noted   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[place]) 08/22/2022   GERD (gastroesophageal reflux disease) 08/21/2022   Agitation 08/20/2022   Acute left-sided weakness 08/20/2022   Expressive aphasia    Stroke ([place]) 08/19/2022   Leukocytosis 08/19/2022   History of urticaria 04/28/2022   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   Primary osteoarthritis of left knee 02/24/2022   Primary osteoarthritis of right knee  02/24/2022   Lumbar spondylolysis 04/12/2020   History of CVA (cerebrovascular accident) 03/26/2020   Low back pain radiating to right lower extremity 03/21/2020   B12 deficiency 03/06/2020   Positive anti-CCP test 12/21/2019   Arthralgia 12/13/2019   Dermatitis 12/13/2019   Rheumatoid factor positive 12/13/2019   Essential hypertension 12/11/2018   Palpitations 12/11/2018   Acquired hypothyroidism 11/10/2018   Arthritis of knee 09/17/2016   Anxiety 11/22/2014   Asthma without status asthmaticus 11/22/2014   Benign neoplasm of colon, unspecified 11/22/2014   Environmental allergies 11/22/2014   Hypertriglyceridemia 11/22/2014   Hypokalemia 11/22/2014   Personal history of disease of skin and subcutaneous tissue 11/22/2014   REFERRING DIAG: CVA   THERAPY DIAG:  Muscle weakness (generalized)   Other lack of coordination   Rationale for Evaluation and Treatment Rehabilitation   SUBJECTIVE:   Pt. doing well today.   SUBJECTIVE STATEMENT:  Pt./caregiver are excited to learn more about [facility] being presented by Dr. Dianna Limbo at the Stroke support group in [place].    Pt accompanied by: significant other   PERTINENT HISTORY: Patient is an 82 year-old female who was admitted to [facility] on 11/23/2022 with a cerebellar CVA. 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 Pt. Was admitted to Inpatient Rehab from 12/21-1/02/2023. 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  Patient underwent inpatient rehabilitation for 2 weeks.  Patient was assessed and was scheduled for a knee replacement on 08/29/2022 however had to cancel it due to having had a CVA.  Patient had a recent fall 2 days after discharging from inpatient rehab. Past Medical History includes: Knee replacement, essential HTN, hypokalemia, leukocytosis, seizures, positive anti-- CCP test, anxiety disorder, mini strokes.  Patient had shingles  with left eye nerve pain s/p 1 year ago.    PRECAUTIONS: Fall   WEIGHT BEARING RESTRICTIONS No   PAIN:  Are you having pain? left elbow/shoulder initially improved with ROM: NR   FALLS: Has patient fallen in last 6 months? Yes. Number of falls 1   LIVING ENVIRONMENT: Lives with: Lives with Spouse Lives in: House/apartment Stairs: 2 storey home, resides on the first floor.  External: 2 stairs front no rails, and 6 in back with rails Has following equipment at home: Single point cane, Walker - 2 wheeled, Environmental consultant - 4 wheeled, Shower bench, and bed side commode   PLOF: Independent   PATIENT GOALS  To Regain the use of her left arm   OBJECTIVE:    HAND DOMINANCE: Right   ADLs: Overall ADLs: Husband assists pt. as needed Transfers/ambulation related to ADLs:Pt. Uses a 3 wheeled walker with Husband assist. Eating: Pt. Is independent with  the right hand. Pt. has difficulty cutting food. Grooming: Pt. is using her right hand, however has difficulty sustaining her LUE in elevation to assist with haircare. UB Dressing: Pt. Is independent donning a pullover shirt, and button down shirt. Has difficulty with buttoning, LB Dressing:  Independent donning pants, and socks. Difficulty tying shoes. Toileting: Independent Bathing: Pt. Is able to engage her right hand. Tub Shower transfers: Supervision Equipment: See above for equipment     IADLs: Shopping:  Has not had the opportunity for grocery shopping yet Light housekeeping: Husband is assisting with light house keeping Meal Prep:  Dependent Community mobility: Relies of family/friends Medication management: Husband assisting with weekly pillbox set-up, and administering medication. Financial management: TBD Handwriting: 75% legible   MOBILITY STATUS: Hx of falls   POSTURE COMMENTS:  No Significant postural limitations Sitting balance: supported sitting balance WFL   ACTIVITY TOLERANCE: Activity tolerance:  Fatigues in greater  than 30 min.    FUNCTIONAL OUTCOME MEASURES: FOTO: 57   UPPER EXTREMITY ROM      Active ROM Right Eval: WFL Left eval  Shoulder flexion   132  Shoulder abduction   80  Shoulder adduction      Shoulder extension      Shoulder internal rotation      Shoulder external rotation      Elbow flexion   140  Elbow extension   WNL  Wrist flexion   65  Wrist extension   -10  Wrist ulnar deviation      Wrist radial deviation      Wrist pronation      Wrist supination      (Blank rows = not tested)   Left digit flexion to [facility]: 2nd: 0cm, 3rd: 0cm, 4th: 0cm, 5th: 0cm   Limited Left full 2nd digit extension     UPPER EXTREMITY MMT:      MMT Right Eval: 4+/5 overall Left eval  Shoulder flexion   3/5  Shoulder abduction   3-/5  Shoulder adduction      Shoulder extension      Shoulder internal rotation      Shoulder external rotation      Middle trapezius      Lower trapezius      Elbow flexion   3+/5  Elbow extension   3+/5  Wrist flexion      Wrist extension   2-/5  Wrist ulnar deviation      Wrist radial deviation      Wrist pronation      Wrist supination      (Blank rows = not tested)   HAND FUNCTION: Grip strength: Right: 26#, Left: 10# Pinch strength: Right 8#, Left: 3#, 3 Pt. Pinch strength: Right: 9#, L: 2#   COORDINATION: Right: 22 sec., Left: <5 min. To place 7 pegs with increased compensation proximally in the trunk, and through reflexive associated reactions.   SENSATION: Light touch: WFL, proprioceptive awareness: Intact   EDEMA: N/A   MUSCLE TONE: LUE: Hypotonic   COGNITION: Overall cognitive status: WFL for tasks assessed. Pt. Is impulsive at times.   VISION: Subjective report: Pt. report having shingles affecting left eye  s/p 1 year. Has nerve pain Baseline vision: Wears glasses for reading only Visual history:  updated see clinical impression   VISION ASSESSMENT:    WFL for tasks performed   PERCEPTION: Intact   PRAXIS: Impaired: Motor  planning   OBSERVATIONS:  Pt. More alert, and engaging since prior to the most  recent hospitalization.     TODAY'S TREATMENT:    Self care:  Pt. worked on isolating 2nd digit extension, and grasping flat coins off the edge of a raised surface to her thumb. Pt. worked on pressing them into place in a resistive container.  Pt. worked on pressing them back into place using isolated 2nd digit extension while flexing the 3rd, 4th, and 5th digits. Pt. worked on grasping 1/2" circular tipped pegs with the left wrist extended. Pt. worked on placing them onto a goal directed target placing them into the pegboard, then isolating 2nd digit extension, and pressing them into place. Pt. worked on pressing them into the place while counting, with reps of increasing speed.  Therapeutic Ex:  Pt. participated in BUE AROM for left shoulder flexion, abduction.    Pt. continues to be very motivated for therapy. Pt. Continues to improve with insolating the 2nd digit into extension independent of the other digits. Pt. Continues to compensate proximally with the right hand when focusing on using the left hand. Pt. requires alternating proprioception with the left hand to inhibit tone in the 2nd digit. Pt.  required verbal cues, and cues for visual demonstration. Pt. was able to grasp the small pegs when stabilized upright, however had difficulty grasping them form the tabletop surface. Pt. Continues to present with limited motor control when counting, and challenging with increasing speed. Patient continues to work on improving left shoulder elbow and wrist strength, improving motor control and coordination in order to prepare the left upper extremity and hand for functional reaching, and engaging the left upper extremity when performing hair care, buttoning, securely holding flowers, cutting food, and manipulating/handling/sorting utensils from a drawer for setting a table, as well as adaptive and compensatory strategies  during daily ADL and IADL care.     PATIENT EDUCATION: Education details: left hand digit extension Person educated: Patient and Spouse Education method: Customer service manager Education comprehension: verbalized understanding, returned demonstration, and needs further education     HOME EXERCISE PROGRAM:    Reviewed activities at home to promote isolated 2nd digit extension.       GOALS: Goals reviewed with patient? Yes   SHORT TERM GOALS: Target date: 01/27/2023       1. Patient will be independent with home exercise program for the left upper extremity Baseline: No current home exercise program Goal status: INITIAL   2. Pt. Will improve active left 2nd digit extension to be able able to isolate her 2nd digit in preparation for pressing/pushing buttons on appliances, phones, or remotes. Baseline: Is unable to actively full digit extension Goal status: INITIAL     LONG TERM GOALS: Target date: 03/10/2023     Patient will improve left shoulder strength by 2 mm grades to be able to sustain UE's in elevation long enough to wash her hair.  Baseline: Eval: Left shoulder flexion: 3/5, abduction: 3-/5 Goal status: INITIAL   2.  Patient will improve left shoulder active abduction to be able to comb her hair Baseline: Eval: Left shoulder abduction is 80(108) Goal status: INITIAL   3.  Patient will independently button her shirt with modified independence. Baseline: Eval: Patient requires has difficulty.  Goal status: INITIAL   4.  Patient well improve left grip strength preparation  for securely holding flowers. Baseline: Eval: Pt. Is unable to securely hold flowers. Goal status: INITIAL   5.  Pt. will independently recall adaptive  strategies for performing ADL tasks including: flossing teeth, donning bra, applying  makeup. Baseline: Eval: Pt. to be provided with adaptive strategies. Goal status: INITIAL   6.  Pt. will improve Foto score by 2 points to reflect  patient perceived performance improvement assessment specific ADLs  and IADLs Baseline: Eval: 57 Goal status: INITIAL  7.  Pt. will improve left hand coordination skills in order to be able to handle, and sort utensils in a drawer.     Baseline: Eval: Pt. has difficulty sorting, and placing utensils with the left hand. Left FMC : >5 min. For 7 pegs on the 9 hole peg test.    Goal Status: INITIAL       ASSESSMENT:   CLINICAL IMPRESSION:  Pt. continues to be very motivated for therapy. Pt. Continues to improve with insolating the 2nd digit into extension independent of the other digits. Pt. Continues to compensate proximally with the right hand when focusing on using the left hand. Pt. requires alternating proprioception with the left hand to inhibit tone in the 2nd digit. Pt.  required verbal cues, and cues for visual demonstration. Pt. was able to grasp the small pegs when stabilized upright, however had difficulty grasping them form the tabletop surface. Pt. Continues to present with limited motor control when counting, and challenging with increasing speed. Patient continues to work on improving left shoulder elbow and wrist strength, improving motor control and coordination in order to prepare the left upper extremity and hand for functional reaching, and engaging the left upper extremity when performing hair care, buttoning, securely holding flowers, cutting food, and manipulating/handling/sorting utensils from a drawer for setting a table, as well as adaptive and compensatory strategies during daily ADL and IADL care.     PERFORMANCE DEFICITS in functional skills including ADLs, IADLs, coordination, proprioception, ROM, strength, [place], and GMC, cognitive skills including memory, and psychosocial skills including coping strategies, environmental adaptation, interpersonal interactions, and routines and behaviors.    IMPAIRMENTS are limiting patient from ADLs, IADLs, education, leisure, and social  participation.    COMORBIDITIES may have co-morbidities  that affects occupational performance. Patient will benefit from skilled OT to address above impairments and improve overall function.   MODIFICATION OR ASSISTANCE TO COMPLETE EVALUATION: Min-Moderate modification of tasks or assist with assess necessary to complete an evaluation.   OT OCCUPATIONAL PROFILE AND HISTORY: Detailed assessment: Review of records and additional review of physical, cognitive, psychosocial history related to current functional performance.   CLINICAL DECISION MAKING: Moderate - several treatment options, min-mod task modification necessary   REHAB POTENTIAL: Good   EVALUATION COMPLEXITY: Moderate      PLAN: OT FREQUENCY: 2x/week   OT DURATION: 12 weeks   PLANNED INTERVENTIONS: self care/ADL training, therapeutic exercise, therapeutic activity, neuromuscular re-education, manual therapy, passive range of motion, functional mobility training, electrical stimulation, and paraffin   RECOMMENDED OTHER SERVICES: PT   CONSULTED AND AGREED WITH PLAN OF CARE: Patient and family member/caregiver   PLAN FOR NEXT SESSION: Initiate OT treatment  Harrel Carina, MS, OTR/L   Harrel Carina, OT 01/21/2023, 9:03 AM

## 2023-01-22 ENCOUNTER — Ambulatory Visit: Payer: Medicare HMO

## 2023-01-22 ENCOUNTER — Ambulatory Visit: Payer: Medicare HMO | Admitting: Occupational Therapy

## 2023-01-22 ENCOUNTER — Encounter: Payer: Medicare HMO | Admitting: Speech Pathology

## 2023-01-22 DIAGNOSIS — R278 Other lack of coordination: Secondary | ICD-10-CM

## 2023-01-22 DIAGNOSIS — M6281 Muscle weakness (generalized): Secondary | ICD-10-CM

## 2023-01-22 NOTE — Therapy (Signed)
Occupational Therapy Progress Note  Dates of reporting period  12/16/2022   to   01/22/2023  Patient Name: [name] MRN: [id number] DOB:01/09/1941, 82 y.o., female Today's Date: 01/22/2023  PCP: Dr. Doy Hutching REFERRING PROVIDER:  Dr. Doy Hutching  END OF SESSION:  OT End of Session - 01/22/23 1354     Visit Number 10    Number of Visits 24    Date for OT Re-Evaluation 03/10/23    Authorization Time Period Progress report period starting 12/16/2022    OT Start Time 1347    OT Stop Time 1430    OT Time Calculation (min) 43 min    Activity Tolerance Patient limited by fatigue    Behavior During Therapy [facility] for tasks assessed/performed               Past Medical History:  Diagnosis Date   Anemia    Arthritis    Asthma    uses inhaler just prior to surgery to avoid attack   Back pain    from previous injury   Complication of anesthesia    has woken  up during 2 different surgery   Depression    no current issue/treatment; situation   Gallstones    GERD (gastroesophageal reflux disease)    Hiatal hernia    patient does NOT have nerve/muscle disease   History of kidney stones    HLD (hyperlipidemia)    HTN (hypertension)    Hypothyroidism    Kidney stones    Knee pain    Nausea and vomiting 10/15/2022   Non-diabetic pancreatic hormone dysfunction years   pt. 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   Pancreatitis    Pneumonia    Seizures (HCC)    caused by dye injected during a procedure   Shortness of breath    with exertion   Sinus problem    frequent infections/congestion   Stroke ([place]) 2021   reports having CVA in 2021 and having mini strokes before that   Thyroid disease    Past Surgical History:  Procedure Laterality Date   ABDOMINAL HYSTERECTOMY     APPENDECTOMY     CARPAL TUNNEL RELEASE  10+ years ago   bilateral   EYE SURGERY  3 yrs ago   bilateral cataracts   FOOT OSTEOTOMY  6 weeks ago   Left foot: great, 2nd & 3rd   FOOT OSTEOTOMY  5 years  ago   Right great toe   HAND SURGERY Bilateral 2011-most recent   multiple hand surgeries, 2 on left, 3 on right   KNEE ARTHROPLASTY Right 04/28/2022   Procedure: COMPUTER ASSISTED TOTAL KNEE ARTHROPLASTY;  Surgeon: Dereck Leep, MD;  Location: [facility];  Service: Orthopedics;  Laterality: Right;   LOOP RECORDER INSERTION N/A 05/16/2020   Procedure: LOOP RECORDER INSERTION;  Surgeon: Isaias Cowman, MD;  Location: [place] [facility];  Service: Cardiovascular;  Laterality: N/A;   NASAL SINUS SURGERY  most recent 7-8 yrs ago   7 sinus surgeries    TRIGGER FINGER RELEASE  11/19/2011   Procedure: RELEASE TRIGGER FINGER/A-1 PULLEY;  Surgeon: Wynonia Sours, MD;  Location: [place];  Service: Orthopedics;  Laterality: Right;  release a-1 pulley right index finger and cyst removal   WRIST GANGLION EXCISION  1980's   right   Patient Active Problem List   Diagnosis Date Noted   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 11/27/2022   Hypomagnesemia 11/27/2022   Occlusion of right vertebral artery  11/23/2022   HLD (hyperlipidemia) 11/23/2022   Asthma 11/23/2022   Depression with anxiety 11/23/2022   Chronic diastolic CHF (congestive heart failure) ([place]) 11/23/2022   Normocytic anemia 11/23/2022   Aspiration pneumonia ([place]) 11/23/2022   AKI (acute kidney injury) ([place]) 11/23/2022   Abdominal pain 11/23/2022   Mesenteric mass 11/23/2022   Coffee ground emesis 11/23/2022   Nausea and vomiting 10/15/2022   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   Fatigue 09/17/2022   Left hemiparesis ([place]) 09/17/2022   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[place]) 08/22/2022   GERD (gastroesophageal reflux disease) 08/21/2022   Agitation 08/20/2022   Acute left-sided weakness 08/20/2022   Expressive aphasia    Stroke ([place]) 08/19/2022   Leukocytosis 08/19/2022   History of urticaria 04/28/2022   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   Primary osteoarthritis of left knee 02/24/2022    Primary osteoarthritis of right knee 02/24/2022   Lumbar spondylolysis 04/12/2020   History of CVA (cerebrovascular accident) 03/26/2020   Low back pain radiating to right lower extremity 03/21/2020   B12 deficiency 03/06/2020   Positive anti-CCP test 12/21/2019   Arthralgia 12/13/2019   Dermatitis 12/13/2019   Rheumatoid factor positive 12/13/2019   Essential hypertension 12/11/2018   Palpitations 12/11/2018   Acquired hypothyroidism 11/10/2018   Arthritis of knee 09/17/2016   Anxiety 11/22/2014   Asthma without status asthmaticus 11/22/2014   Benign neoplasm of colon, unspecified 11/22/2014   Environmental allergies 11/22/2014   Hypertriglyceridemia 11/22/2014   Hypokalemia 11/22/2014   Personal history of disease of skin and subcutaneous tissue 11/22/2014   REFERRING DIAG: CVA   THERAPY DIAG:  Muscle weakness (generalized)   Other lack of coordination   Rationale for Evaluation and Treatment Rehabilitation   SUBJECTIVE:   Pt. doing well today.   SUBJECTIVE STATEMENT:  Pt. reports that she was able to pick up something from the floor with her left hand.   Pt accompanied by: significant other   PERTINENT HISTORY: Patient is an 82 year-old female who was admitted to [facility] on 11/23/2022 with a cerebellar CVA. 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 Pt. Was admitted to Inpatient Rehab from 12/21-1/02/2023. 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  Patient underwent inpatient rehabilitation for 2 weeks.  Patient was assessed and was scheduled for a knee replacement on 08/29/2022 however had to cancel it due to having had a CVA.  Patient had a recent fall 2 days after discharging from inpatient rehab. Past Medical History includes: Knee replacement, essential HTN, hypokalemia, leukocytosis, seizures, positive anti-- CCP test, anxiety disorder, mini strokes.  Patient had shingles with left  eye nerve pain s/p 1 year ago.    PRECAUTIONS: Fall   WEIGHT BEARING RESTRICTIONS No   PAIN:  Are you having pain?  I/10 in the left shoulder   FALLS: Has patient fallen in last 6 months? Yes. Number of falls 1   LIVING ENVIRONMENT: Lives with: Lives with Spouse Lives in: House/apartment Stairs: 2 storey home, resides on the first floor.  External: 2 stairs front no rails, and 6 in back with rails Has following equipment at home: Single point cane, Walker - 2 wheeled, Environmental consultant - 4 wheeled, Shower bench, and bed side commode   PLOF: Independent   PATIENT GOALS  To Regain the use of her left arm   OBJECTIVE:    HAND DOMINANCE: Right   ADLs: Overall ADLs: Husband assists pt. as needed Transfers/ambulation related to ADLs:Pt. Uses a 3 wheeled walker with  Husband assist. Eating: Pt. Is independent with the right hand. Pt. has difficulty cutting food. Grooming: Pt. is using her right hand, however has difficulty sustaining her LUE in elevation to assist with haircare. UB Dressing: Pt. Is independent donning a pullover shirt, and button down shirt. Has difficulty with buttoning, LB Dressing:  Independent donning pants, and socks. Difficulty tying shoes. Toileting: Independent Bathing: Pt. Is able to engage her right hand. Tub Shower transfers: Supervision Equipment: See above for equipment     IADLs: Shopping:  Has not had the opportunity for grocery shopping yet Light housekeeping: Husband is assisting with light house keeping Meal Prep:  Dependent Community mobility: Relies of family/friends Medication management: Husband assisting with weekly pillbox set-up, and administering medication. Financial management: TBD Handwriting: 75% legible   MOBILITY STATUS: Hx of falls   POSTURE COMMENTS:  No Significant postural limitations Sitting balance: supported sitting balance WFL   ACTIVITY TOLERANCE: Activity tolerance:  Fatigues in greater than 30 min.    FUNCTIONAL OUTCOME  MEASURES: FOTO: 57   UPPER EXTREMITY ROM      Active ROM Right Eval: WFL Left eval Left  01/22/23  Shoulder flexion   132 100  Shoulder abduction   80 85  Shoulder adduction       Shoulder extension       Shoulder internal rotation       Shoulder external rotation       Elbow flexion   140 140  Elbow extension   WNL WNL  Wrist flexion   65 68  Wrist extension   -10 20  Wrist ulnar deviation     12  Wrist radial deviation     8  Wrist pronation       Wrist supination       (Blank rows = not tested)   Left digit flexion to [facility]: 2nd: 0cm, 3rd: 0cm, 4th: 0cm, 5th: 0cm   Limited Left full 2nd digit extension     UPPER EXTREMITY MMT:      MMT Right Eval: 4+/5 overall Left Eval Left Eval  Shoulder flexion   3/5 3-/5  Shoulder abduction   3-/5 3-/5  Shoulder adduction       Shoulder extension       Shoulder internal rotation       Shoulder external rotation       Middle trapezius       Lower trapezius       Elbow flexion   3+/5 4/5  Elbow extension   3+/5 4/4  Wrist flexion       Wrist extension   2-/5 3-/5  Wrist ulnar deviation       Wrist radial deviation       Wrist pronation       Wrist supination       (Blank rows = not tested)   HAND FUNCTION: Grip strength: Right: 26#, Left: 10# Pinch strength: Right 8#, Left: 3#, 3 Pt. Pinch strength: Right: 9#, L: 2#  01/22/2023 Grip strength: Right: 26#, Left: 12# Pinch strength:  Pinch meter used at the initial eval has been sent out for recalibration     COORDINATION: Right: 22 sec., Left: <5 min. To place 7 pegs with increased compensation proximally in the trunk, and through reflexive associated reactions.  01/22/23 Right: 22 sec., Left: 3 min. & 4 sec.   SENSATION: Light touch: WFL, proprioceptive awareness: Intact   EDEMA: N/A   MUSCLE TONE: LUE: Hypotonic   COGNITION: Overall cognitive  status: WFL for tasks assessed. Pt. Is impulsive at times.   VISION: Subjective report: Pt. report having  shingles affecting left eye  s/p 1 year. Has nerve pain Baseline vision: Wears glasses for reading only Visual history:  updated see clinical impression   VISION ASSESSMENT:    WFL for tasks performed   PERCEPTION: Intact   PRAXIS: Impaired: Motor planning   OBSERVATIONS:  Pt. More alert, and engaging since prior to the most recent hospitalization.     TODAY'S TREATMENT:    Measurements were obtained, and goals were reviewed with the Pt. Pt. has made progress over this progress reporting period with [facility] in left shoulder abduction, and wrist extension, improved with left grip strength, and has made significant progress with left hand [facility] skills. Pt.'s FOTO score is 67. Pt. continues to have difficulty with engaging her left hand during daily ADL, and IADL tasks. Pt. Presents with difficulty sustaining LUE elevation long enough to thoroughly complete haircare. Pt. Continues to benefit from OT services to work towards improving Left UE functioning during hair care, buttoning a shirt, holding flowers securely, pressing buttons on appliances/phones and sorting utensils.    PATIENT EDUCATION: Education details: left hand digit extension Person educated: Patient and Spouse Education method: Customer service manager Education comprehension: verbalized understanding, returned demonstration, and needs further education     HOME EXERCISE PROGRAM:    Reviewed activities at home to promote isolated 2nd digit extension.       GOALS: Goals reviewed with patient? Yes   SHORT TERM GOALS: Target date: 01/27/2023       1. Patient will be independent with home exercise program for the left upper extremity Baseline: 02/01/2023: Pt. Consistently attempts to perform HEPs independently. No current home exercise program Goal status: Ongoing     LONG TERM GOALS: Target date: 03/10/2023     Patient will improve left shoulder strength by 2 mm grades to be able to sustain UE's in elevation long  enough to wash her hair.  Baseline: 01/22/2023: Left shoulder flexion: 3-/5, abduction: 3-/5 Eval: Left shoulder flexion: 3/5, abduction: 3-/5 Goal status: Ongoing   2.  Patient will improve left shoulder active abduction to be able to comb her hair Baseline: 01/22/2023: Shoulder abduction: 85 Eval: Left shoulder abduction is 80(108) Goal status: Ongoing   3.  Patient will independently button her shirt with modified independence. Baseline: 01/22/2023: Pt. continues to have difficulty. Eval: Patient has difficulty.  Goal status: Ongoing   4.  Patient well improve left grip strength in preparation  for securely holding flowers. Baseline: 01/22/2023: Left: 12# Eval: Pt. Is unable to securely hold flowers. Goal status: Ongoing   5.  Pt. will independently recall adaptive  strategies for performing ADL tasks including: flossing teeth, donning bra, applying makeup. Baseline: 01/22/2023: Pt. continues to benefit from education about adaptive strategies during ADLs, and IADLs. Eval: Pt. to be provided with adaptive strategies. Goal status: Ongoing   6.  Pt. will improve Foto score by 2 points to reflect patient perceived performance improvement assessment specific ADLs  and IADLs FOTO score:67 Eval: 57 Goal status: Ongoing  7.  Pt. will improve left hand coordination skills in order to be able to handle, and sort utensils in a drawer.     Baseline: 01/22/2022: Left: 3 min. & 4 sec. Eval: Pt. has difficulty sorting, and placing utensils with the left hand. Left FMC : >5 min. For 7 pegs on the 9 hole peg test.    Goal Status:  Ongoing  8. Pt. will improve active left 2nd digit extension to be able able to isolate her 2nd digit in preparation for pressing/pushing buttons on appliances, phones, or remotes. Baseline: 01/22/2023: s able to perform full digit extension, although 2nd digit is slow to extend s 2/2 flexor tone. Pt.  Eval: Pt. is able to is unable to actively perform full digit extension Goal  status:  Ongoing   ASSESSMENT:   CLINICAL IMPRESSION:  Pt. continues to be very motivated for therapy. Pt. Continues to improve with insolating the 2nd digit into extension independent of the other digits. Pt. Continues to compensate proximally with the right hand when focusing on using the left hand. Pt. requires alternating proprioception with the left hand to inhibit tone in the 2nd digit. Pt.  required verbal cues, and cues for visual demonstration. Pt. was able to grasp the small pegs when stabilized upright, however had difficulty grasping them form the tabletop surface. Pt. Continues to present with limited motor control when counting, and challenging with increasing speed. Patient continues to work on improving left shoulder elbow and wrist strength, improving motor control and coordination in order to prepare the left upper extremity and hand for functional reaching, and engaging the left upper extremity when performing hair care, buttoning, securely holding flowers, cutting food, and manipulating/handling/sorting utensils from a drawer for setting a table, as well as adaptive and compensatory strategies during daily ADL and IADL care.     PERFORMANCE DEFICITS in functional skills including ADLs, IADLs, coordination, proprioception, ROM, strength, [place], and GMC, cognitive skills including memory, and psychosocial skills including coping strategies, environmental adaptation, interpersonal interactions, and routines and behaviors.    IMPAIRMENTS are limiting patient from ADLs, IADLs, education, leisure, and social participation.    COMORBIDITIES may have co-morbidities  that affects occupational performance. Patient will benefit from skilled OT to address above impairments and improve overall function.   MODIFICATION OR ASSISTANCE TO COMPLETE EVALUATION: Min-Moderate modification of tasks or assist with assess necessary to complete an evaluation.   OT OCCUPATIONAL PROFILE AND HISTORY: Detailed  assessment: Review of records and additional review of physical, cognitive, psychosocial history related to current functional performance.   CLINICAL DECISION MAKING: Moderate - several treatment options, min-mod task modification necessary   REHAB POTENTIAL: Good   EVALUATION COMPLEXITY: Moderate      PLAN: OT FREQUENCY: 2x/week   OT DURATION: 12 weeks   PLANNED INTERVENTIONS: self care/ADL training, therapeutic exercise, therapeutic activity, neuromuscular re-education, manual therapy, passive range of motion, functional mobility training, electrical stimulation, and paraffin   RECOMMENDED OTHER SERVICES: PT   CONSULTED AND AGREED WITH PLAN OF CARE: Patient and family member/caregiver   PLAN FOR NEXT SESSION: Initiate OT treatment  Harrel Carina, MS, OTR/L   Harrel Carina, OT 01/22/2023, 3:08 PM

## 2023-01-23 DIAGNOSIS — I639 Cerebral infarction, unspecified: Secondary | ICD-10-CM | POA: Diagnosis not present

## 2023-01-23 DIAGNOSIS — G8194 Hemiplegia, unspecified affecting left nondominant side: Secondary | ICD-10-CM | POA: Diagnosis not present

## 2023-01-23 DIAGNOSIS — R935 Abnormal findings on diagnostic imaging of other abdominal regions, including retroperitoneum: Secondary | ICD-10-CM | POA: Diagnosis not present

## 2023-01-23 DIAGNOSIS — R112 Nausea with vomiting, unspecified: Secondary | ICD-10-CM | POA: Diagnosis not present

## 2023-01-23 DIAGNOSIS — R0683 Snoring: Secondary | ICD-10-CM | POA: Diagnosis not present

## 2023-01-23 DIAGNOSIS — R292 Abnormal reflex: Secondary | ICD-10-CM | POA: Diagnosis not present

## 2023-01-27 ENCOUNTER — Encounter: Payer: Medicare HMO | Admitting: Speech Pathology

## 2023-01-27 ENCOUNTER — Ambulatory Visit: Payer: Medicare HMO

## 2023-01-27 ENCOUNTER — Ambulatory Visit: Payer: Medicare HMO | Admitting: Occupational Therapy

## 2023-01-27 DIAGNOSIS — M6281 Muscle weakness (generalized): Secondary | ICD-10-CM

## 2023-01-27 DIAGNOSIS — R278 Other lack of coordination: Secondary | ICD-10-CM | POA: Diagnosis not present

## 2023-01-28 NOTE — Therapy (Signed)
Occupational Therapy Treatment Note  Patient Name: [name] MRN: [id number] DOB:Oct 12, 1941, 82 y.o., female Today's Date: 01/28/2023  PCP: Dr. Doy Hutching REFERRING PROVIDER:  Dr. Doy Hutching  END OF SESSION:  OT End of Session - 01/28/23 1305     Visit Number 11    Number of Visits 24    Date for OT Re-Evaluation 03/10/23    Authorization Time Period Progress report period starting 12/16/2022    OT Start Time 1347    OT Stop Time 1430    OT Time Calculation (min) 43 min    Activity Tolerance Patient limited by fatigue    Behavior During Therapy [facility] for tasks assessed/performed               Past Medical History:  Diagnosis Date   Anemia    Arthritis    Asthma    uses inhaler just prior to surgery to avoid attack   Back pain    from previous injury   Complication of anesthesia    has woken  up during 2 different surgery   Depression    no current issue/treatment; situation   Gallstones    GERD (gastroesophageal reflux disease)    Hiatal hernia    patient does NOT have nerve/muscle disease   History of kidney stones    HLD (hyperlipidemia)    HTN (hypertension)    Hypothyroidism    Kidney stones    Knee pain    Nausea and vomiting 10/15/2022   Non-diabetic pancreatic hormone dysfunction years   pt. 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   Pancreatitis    Pneumonia    Seizures ([place])    caused by dye injected during a procedure   Shortness of breath    with exertion   Sinus problem    frequent infections/congestion   Stroke ([place]) 2021   reports having CVA in 2021 and having mini strokes before that   Thyroid disease    Past Surgical History:  Procedure Laterality Date   ABDOMINAL HYSTERECTOMY     APPENDECTOMY     CARPAL TUNNEL RELEASE  10+ years ago   bilateral   EYE SURGERY  3 yrs ago   bilateral cataracts   FOOT OSTEOTOMY  6 weeks ago   Left foot: great, 2nd & 3rd   FOOT OSTEOTOMY  5 years ago   Right great toe   HAND SURGERY Bilateral  2011-most recent   multiple hand surgeries, 2 on left, 3 on right   KNEE ARTHROPLASTY Right 04/28/2022   Procedure: COMPUTER ASSISTED TOTAL KNEE ARTHROPLASTY;  Surgeon: Dereck Leep, MD;  Location: [facility];  Service: Orthopedics;  Laterality: Right;   LOOP RECORDER INSERTION N/A 05/16/2020   Procedure: LOOP RECORDER INSERTION;  Surgeon: Isaias Cowman, MD;  Location: [place] [facility];  Service: Cardiovascular;  Laterality: N/A;   NASAL SINUS SURGERY  most recent 7-8 yrs ago   7 sinus surgeries    TRIGGER FINGER RELEASE  11/19/2011   Procedure: RELEASE TRIGGER FINGER/A-1 PULLEY;  Surgeon: Wynonia Sours, MD;  Location: [place];  Service: Orthopedics;  Laterality: Right;  release a-1 pulley right index finger and cyst removal   WRIST GANGLION EXCISION  1980's   right   Patient Active Problem List   Diagnosis Date Noted   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 11/27/2022   Hypomagnesemia 11/27/2022   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   HLD (hyperlipidemia) 11/23/2022   Asthma 11/23/2022   Depression  with anxiety 11/23/2022   Chronic diastolic CHF (congestive heart failure) ([place]) 11/23/2022   Normocytic anemia 11/23/2022   Aspiration pneumonia ([place] ) 11/23/2022   AKI (acute kidney injury) ([place]) 11/23/2022   Abdominal pain 11/23/2022   Mesenteric mass 11/23/2022   Coffee ground emesis 11/23/2022   Nausea and vomiting 10/15/2022   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   Fatigue 09/17/2022   Left hemiparesis ([place]) 09/17/2022   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[place]) 08/22/2022   GERD (gastroesophageal reflux disease) 08/21/2022   Agitation 08/20/2022   Acute left-sided weakness 08/20/2022   Expressive aphasia    Stroke ([place]) 08/19/2022   Leukocytosis 08/19/2022   History of urticaria 04/28/2022   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   Primary osteoarthritis of left knee 02/24/2022   Primary osteoarthritis of right knee 02/24/2022    Lumbar spondylolysis 04/12/2020   History of CVA (cerebrovascular accident) 03/26/2020   Low back pain radiating to right lower extremity 03/21/2020   B12 deficiency 03/06/2020   Positive anti-CCP test 12/21/2019   Arthralgia 12/13/2019   Dermatitis 12/13/2019   Rheumatoid factor positive 12/13/2019   Essential hypertension 12/11/2018   Palpitations 12/11/2018   Acquired hypothyroidism 11/10/2018   Arthritis of knee 09/17/2016   Anxiety 11/22/2014   Asthma without status asthmaticus 11/22/2014   Benign neoplasm of colon, unspecified 11/22/2014   Environmental allergies 11/22/2014   Hypertriglyceridemia 11/22/2014   Hypokalemia 11/22/2014   Personal history of disease of skin and subcutaneous tissue 11/22/2014   REFERRING DIAG: CVA   THERAPY DIAG:  Muscle weakness (generalized)   Other lack of coordination   Rationale for Evaluation and Treatment Rehabilitation   SUBJECTIVE:   Pt. doing well today.   SUBJECTIVE STATEMENT:  Pt. Reports being tired and  that she was sleeping, and just woke up before therapy.    Pt accompanied by: significant other   PERTINENT HISTORY: Patient is an 82 year-old female who was admitted to [facility] on 11/23/2022 with a cerebellar CVA. 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 Pt. Was admitted to Inpatient Rehab from 12/21-1/02/2023. 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  Patient underwent inpatient rehabilitation for 2 weeks.  Patient was assessed and was scheduled for a knee replacement on 08/29/2022 however had to cancel it due to having had a CVA.  Patient had a recent fall 2 days after discharging from inpatient rehab. Past Medical History includes: Knee replacement, essential HTN, hypokalemia, leukocytosis, seizures, positive anti-- CCP test, anxiety disorder, mini strokes.  Patient had shingles with left eye nerve pain s/p 1 year ago.    PRECAUTIONS:  Fall   WEIGHT BEARING RESTRICTIONS No   PAIN:  Are you having pain?  I/10 in the left shoulder   FALLS: Has patient fallen in last 6 months? Yes. Number of falls 1   LIVING ENVIRONMENT: Lives with: Lives with Spouse Lives in: House/apartment Stairs: 2 storey home, resides on the first floor.  External: 2 stairs front no rails, and 6 in back with rails Has following equipment at home: Single point cane, Walker - 2 wheeled, Environmental consultant - 4 wheeled, Shower bench, and bed side commode   PLOF: Independent   PATIENT GOALS  To Regain the use of her left arm   OBJECTIVE:    HAND DOMINANCE: Right   ADLs: Overall ADLs: Husband assists pt. as needed Transfers/ambulation related to ADLs:Pt. Uses a 3 wheeled walker with Husband assist. Eating: Pt. Is independent with the right hand. Pt. has difficulty  cutting food. Grooming: Pt. is using her right hand, however has difficulty sustaining her LUE in elevation to assist with haircare. UB Dressing: Pt. Is independent donning a pullover shirt, and button down shirt. Has difficulty with buttoning, LB Dressing:  Independent donning pants, and socks. Difficulty tying shoes. Toileting: Independent Bathing: Pt. Is able to engage her right hand. Tub Shower transfers: Supervision Equipment: See above for equipment     IADLs: Shopping:  Has not had the opportunity for grocery shopping yet Light housekeeping: Husband is assisting with light house keeping Meal Prep:  Dependent Community mobility: Relies of family/friends Medication management: Husband assisting with weekly pillbox set-up, and administering medication. Financial management: TBD Handwriting: 75% legible   MOBILITY STATUS: Hx of falls   POSTURE COMMENTS:  No Significant postural limitations Sitting balance: supported sitting balance WFL   ACTIVITY TOLERANCE: Activity tolerance:  Fatigues in greater than 30 min.    FUNCTIONAL OUTCOME MEASURES: FOTO: 57   UPPER EXTREMITY ROM       Active ROM Right Eval: WFL Left eval Left  01/22/23  Shoulder flexion   132 100  Shoulder abduction   80 85  Shoulder adduction       Shoulder extension       Shoulder internal rotation       Shoulder external rotation       Elbow flexion   140 140  Elbow extension   WNL WNL  Wrist flexion   65 68  Wrist extension   -10 20  Wrist ulnar deviation     12  Wrist radial deviation     8  Wrist pronation       Wrist supination       (Blank rows = not tested)   Left digit flexion to [facility]: 2nd: 0cm, 3rd: 0cm, 4th: 0cm, 5th: 0cm   Limited Left full 2nd digit extension     UPPER EXTREMITY MMT:      MMT Right Eval: 4+/5 overall Left Eval Left Eval  Shoulder flexion   3/5 3-/5  Shoulder abduction   3-/5 3-/5  Shoulder adduction       Shoulder extension       Shoulder internal rotation       Shoulder external rotation       Middle trapezius       Lower trapezius       Elbow flexion   3+/5 4/5  Elbow extension   3+/5 4/4  Wrist flexion       Wrist extension   2-/5 3-/5  Wrist ulnar deviation       Wrist radial deviation       Wrist pronation       Wrist supination       (Blank rows = not tested)   HAND FUNCTION: Grip strength: Right: 26#, Left: 10# Pinch strength: Right 8#, Left: 3#, 3 Pt. Pinch strength: Right: 9#, L: 2#  01/22/2023 Grip strength: Right: 26#, Left: 12# Pinch strength:  Pinch meter used at the initial eval has been sent out for recalibration     COORDINATION: Right: 22 sec., Left: <5 min. To place 7 pegs with increased compensation proximally in the trunk, and through reflexive associated reactions.  01/22/23 Right: 22 sec., Left: 3 min. & 4 sec.   SENSATION: Light touch: WFL, proprioceptive awareness: Intact   EDEMA: N/A   MUSCLE TONE: LUE: Hypotonic   COGNITION: Overall cognitive status: WFL for tasks assessed. Pt. Is impulsive at times.   VISION:  Subjective report: Pt. report having shingles affecting left eye  s/p 1 year. Has nerve  pain Baseline vision: Wears glasses for reading only Visual history:  updated see clinical impression   VISION ASSESSMENT:    WFL for tasks performed   PERCEPTION: Intact   PRAXIS: Impaired: Motor planning   OBSERVATIONS:  Pt. More alert, and engaging since prior to the most recent hospitalization.     TODAY'S TREATMENT:    Pt. Worked on left hand [facility] skills controlled 2pt. Grasp patterns grasping small 1/2" circular marbles from a flat unsteady surface. Emphasis was placed on isolating left 2nd digit extension, while flexing the 3rd, 4th, and 5th digits. Pt. worked on extending all digits when discarding them into a container, and then actively assuming the position again in preparation for grasping the next one. Pt. Was further challenged with adding wrist extension while holding the marble. Pt. required reps of proprioception through the left hand between the exercises.    PATIENT EDUCATION: Education details: left hand digit extension Person educated: Patient and Spouse Education method: Customer service manager Education comprehension: verbalized understanding, returned demonstration, and needs further education     HOME EXERCISE PROGRAM:    Reviewed activities at home to promote isolated 2nd digit extension.       GOALS: Goals reviewed with patient? Yes   SHORT TERM GOALS: Target date: 01/27/2023       1. Patient will be independent with home exercise program for the left upper extremity Baseline: 02/01/2023: Pt. Consistently attempts to perform HEPs independently. No current home exercise program Goal status: Ongoing     LONG TERM GOALS: Target date: 03/10/2023     Patient will improve left shoulder strength by 2 mm grades to be able to sustain UE's in elevation long enough to wash her hair.  Baseline: 01/22/2023: Left shoulder flexion: 3-/5, abduction: 3-/5 Eval: Left shoulder flexion: 3/5, abduction: 3-/5 Goal status: Ongoing   2.  Patient will improve left  shoulder active abduction to be able to comb her hair Baseline: 01/22/2023: Shoulder abduction: 85 Eval: Left shoulder abduction is 80(108) Goal status: Ongoing   3.  Patient will independently button her shirt with modified independence. Baseline: 01/22/2023: Pt. continues to have difficulty. Eval: Patient has difficulty.  Goal status: Ongoing   4.  Patient well improve left grip strength in preparation  for securely holding flowers. Baseline: 01/22/2023: Left: 12# Eval: Pt. Is unable to securely hold flowers. Goal status: Ongoing   5.  Pt. will independently recall adaptive  strategies for performing ADL tasks including: flossing teeth, donning bra, applying makeup. Baseline: 01/22/2023: Pt. continues to benefit from education about adaptive strategies during ADLs, and IADLs. Eval: Pt. to be provided with adaptive strategies. Goal status: Ongoing   6.  Pt. will improve Foto score by 2 points to reflect patient perceived performance improvement assessment specific ADLs  and IADLs FOTO score:67 Eval: 57 Goal status: Ongoing  7.  Pt. will improve left hand coordination skills in order to be able to handle, and sort utensils in a drawer.     Baseline: 01/22/2022: Left: 3 min. & 4 sec. Eval: Pt. has difficulty sorting, and placing utensils with the left hand. Left FMC : >5 min. For 7 pegs on the 9 hole peg test.    Goal Status:  Ongoing  8. Pt. will improve active left 2nd digit extension to be able able to isolate her 2nd digit in preparation for pressing/pushing buttons on appliances, phones, or remotes. Baseline:  01/22/2023: s able to perform full digit extension, although 2nd digit is slow to extend s 2/2 flexor tone. Pt.  Eval: Pt. is able to is unable to actively perform full digit extension Goal status:  Ongoing   ASSESSMENT:   CLINICAL IMPRESSION:   Pt. Tolerated Left UE AAROM stretches prior to [facility] tasks. Pt. Continues to present with a history of left shoulder pain, and limitations.  Pt. Continues to compensate proximally, and from the RUE &LE when attempting to perform [facility] skills with the left hand. Pt. Required cues, assist to initially formulate [facility] grasp patterns. Pt. continues to work on improving left shoulder elbow and wrist strength, improving motor control and coordination in order to prepare the left upper extremity and hand for functional reaching, and engaging the left upper extremity when performing hair care, buttoning, securely holding flowers, cutting food, and manipulating/handling/sorting utensils from a drawer for setting a table, as well as adaptive and compensatory strategies during daily ADL and IADL care.     PERFORMANCE DEFICITS in functional skills including ADLs, IADLs, coordination, proprioception, ROM, strength, [place], and GMC, cognitive skills including memory, and psychosocial skills including coping strategies, environmental adaptation, interpersonal interactions, and routines and behaviors.    IMPAIRMENTS are limiting patient from ADLs, IADLs, education, leisure, and social participation.    COMORBIDITIES may have co-morbidities  that affects occupational performance. Patient will benefit from skilled OT to address above impairments and improve overall function.   MODIFICATION OR ASSISTANCE TO COMPLETE EVALUATION: Min-Moderate modification of tasks or assist with assess necessary to complete an evaluation.   OT OCCUPATIONAL PROFILE AND HISTORY: Detailed assessment: Review of records and additional review of physical, cognitive, psychosocial history related to current functional performance.   CLINICAL DECISION MAKING: Moderate - several treatment options, min-mod task modification necessary   REHAB POTENTIAL: Good   EVALUATION COMPLEXITY: Moderate      PLAN: OT FREQUENCY: 2x/week   OT DURATION: 12 weeks   PLANNED INTERVENTIONS: self care/ADL training, therapeutic exercise, therapeutic activity, neuromuscular re-education, manual therapy,  passive range of motion, functional mobility training, electrical stimulation, and paraffin   RECOMMENDED OTHER SERVICES: PT   CONSULTED AND AGREED WITH PLAN OF CARE: Patient and family member/caregiver   PLAN FOR NEXT SESSION: Initiate OT treatment  Harrel Carina, MS, OTR/L   Harrel Carina, OT 01/28/2023, 1:13 PM

## 2023-01-29 ENCOUNTER — Ambulatory Visit: Payer: Medicare HMO

## 2023-01-29 ENCOUNTER — Ambulatory Visit: Payer: Medicare HMO | Admitting: Occupational Therapy

## 2023-01-29 ENCOUNTER — Encounter: Payer: Medicare HMO | Admitting: Speech Pathology

## 2023-01-29 DIAGNOSIS — M6281 Muscle weakness (generalized): Secondary | ICD-10-CM

## 2023-01-29 DIAGNOSIS — R278 Other lack of coordination: Secondary | ICD-10-CM | POA: Diagnosis not present

## 2023-01-29 NOTE — Therapy (Signed)
Occupational Therapy Treatment Note  Patient Name: [name] MRN: [id number] DOB:[birth year], 82 y.o., female Today's Date: 01/29/2023  PCP: Dr. Doy Hutching REFERRING PROVIDER:  Dr. Doy Hutching  END OF SESSION:  OT End of Session - 01/29/23 1723     Visit Number 12    Number of Visits 24    Date for OT Re-Evaluation 03/10/23    OT Start Time 1350    OT Stop Time 1430    OT Time Calculation (min) 40 min    Activity Tolerance Patient limited by fatigue    Behavior During Therapy [facility] for tasks assessed/performed               Past Medical History:  Diagnosis Date   Anemia    Arthritis    Asthma    uses inhaler just prior to surgery to avoid attack   Back pain    from previous injury   Complication of anesthesia    has woken  up during 2 different surgery   Depression    no current issue/treatment; situation   Gallstones    GERD (gastroesophageal reflux disease)    Hiatal hernia    patient does NOT have nerve/muscle disease   History of kidney stones    HLD (hyperlipidemia)    HTN (hypertension)    Hypothyroidism    Kidney stones    Knee pain    Nausea and vomiting 10/15/2022   Non-diabetic pancreatic hormone dysfunction years   pt. 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   Pancreatitis    Pneumonia    Seizures ([place])    caused by dye injected during a procedure   Shortness of breath    with exertion   Sinus problem    frequent infections/congestion   Stroke ([place]) 2021   reports having CVA in 2021 and having mini strokes before that   Thyroid disease    Past Surgical History:  Procedure Laterality Date   ABDOMINAL HYSTERECTOMY     APPENDECTOMY     CARPAL TUNNEL RELEASE  10+ years ago   bilateral   EYE SURGERY  3 yrs ago   bilateral cataracts   FOOT OSTEOTOMY  6 weeks ago   Left foot: great, 2nd & 3rd   FOOT OSTEOTOMY  5 years ago   Right great toe   HAND SURGERY Bilateral 2011-most recent   multiple hand surgeries, 2 on left, 3 on right   KNEE  ARTHROPLASTY Right 04/28/2022   Procedure: COMPUTER ASSISTED TOTAL KNEE ARTHROPLASTY;  Surgeon: Dereck Leep, MD;  Location: [facility];  Service: Orthopedics;  Laterality: Right;   LOOP RECORDER INSERTION N/A 05/16/2020   Procedure: LOOP RECORDER INSERTION;  Surgeon: Isaias Cowman, MD;  Location: [place] [facility];  Service: Cardiovascular;  Laterality: N/A;   NASAL SINUS SURGERY  most recent 7-8 yrs ago   7 sinus surgeries    TRIGGER FINGER RELEASE  11/19/2011   Procedure: RELEASE TRIGGER FINGER/A-1 PULLEY;  Surgeon: Wynonia Sours, MD;  Location: [place];  Service: Orthopedics;  Laterality: Right;  release a-1 pulley right index finger and cyst removal   WRIST GANGLION EXCISION  1980's   right   Patient Active Problem List   Diagnosis Date Noted   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 11/27/2022   Hypomagnesemia 11/27/2022   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   HLD (hyperlipidemia) 11/23/2022   Asthma 11/23/2022   Depression with anxiety 11/23/2022   Chronic diastolic CHF (congestive heart failure) (  [place]) 11/23/2022   Normocytic anemia 11/23/2022   Aspiration pneumonia ([place]) 11/23/2022   AKI (acute kidney injury) ([place]) 11/23/2022   Abdominal pain 11/23/2022   Mesenteric mass 11/23/2022   Coffee ground emesis 11/23/2022   Nausea and vomiting 10/15/2022   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   Fatigue 09/17/2022   Left hemiparesis ([place]) 09/17/2022   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[place]) 08/22/2022   GERD (gastroesophageal reflux disease) 08/21/2022   Agitation 08/20/2022   Acute left-sided weakness 08/20/2022   Expressive aphasia    Stroke ([place]) 08/19/2022   Leukocytosis 08/19/2022   History of urticaria 04/28/2022   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   Primary osteoarthritis of left knee 02/24/2022   Primary osteoarthritis of right knee 02/24/2022   Lumbar spondylolysis 04/12/2020   History of CVA (cerebrovascular accident)  03/26/2020   Low back pain radiating to right lower extremity 03/21/2020   B12 deficiency 03/06/2020   Positive anti-CCP test 12/21/2019   Arthralgia 12/13/2019   Dermatitis 12/13/2019   Rheumatoid factor positive 12/13/2019   Essential hypertension 12/11/2018   Palpitations 12/11/2018   Acquired hypothyroidism 11/10/2018   Arthritis of knee 09/17/2016   Anxiety 11/22/2014   Asthma without status asthmaticus 11/22/2014   Benign neoplasm of colon, unspecified 11/22/2014   Environmental allergies 11/22/2014   Hypertriglyceridemia 11/22/2014   Hypokalemia 11/22/2014   Personal history of disease of skin and subcutaneous tissue 11/22/2014   REFERRING DIAG: CVA   THERAPY DIAG:  Muscle weakness (generalized)   Other lack of coordination   Rationale for Evaluation and Treatment Rehabilitation   SUBJECTIVE:   Pt. doing well today.   SUBJECTIVE STATEMENT:  Pt. Reports being so tired lately.    Pt accompanied by: significant other   PERTINENT HISTORY: Patient is an 82 year-old female who was admitted to [facility] on 11/23/2022 with a cerebellar CVA. 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 Pt. Was admitted to Inpatient Rehab from 12/21-1/02/2023. 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  Patient underwent inpatient rehabilitation for 2 weeks.  Patient was assessed and was scheduled for a knee replacement on 08/29/2022 however had to cancel it due to having had a CVA.  Patient had a recent fall 2 days after discharging from inpatient rehab. Past Medical History includes: Knee replacement, essential HTN, hypokalemia, leukocytosis, seizures, positive anti-- CCP test, anxiety disorder, mini strokes.  Patient had shingles with left eye nerve pain s/p 1 year ago.    PRECAUTIONS: Fall   WEIGHT BEARING RESTRICTIONS No   PAIN:  Are you having pain?  I/10 in the left shoulder   FALLS: Has patient fallen  in last 6 months? Yes. Number of falls 1   LIVING ENVIRONMENT: Lives with: Lives with Spouse Lives in: House/apartment Stairs: 2 storey home, resides on the first floor.  External: 2 stairs front no rails, and 6 in back with rails Has following equipment at home: Single point cane, Walker - 2 wheeled, Environmental consultant - 4 wheeled, Shower bench, and bed side commode   PLOF: Independent   PATIENT GOALS  To Regain the use of her left arm   OBJECTIVE:    HAND DOMINANCE: Right   ADLs: Overall ADLs: Husband assists pt. as needed Transfers/ambulation related to ADLs:Pt. Uses a 3 wheeled walker with Husband assist. Eating: Pt. Is independent with the right hand. Pt. has difficulty cutting food. Grooming: Pt. is using her right hand, however has difficulty sustaining her LUE in elevation to assist with haircare.  UB Dressing: Pt. Is independent donning a pullover shirt, and button down shirt. Has difficulty with buttoning, LB Dressing:  Independent donning pants, and socks. Difficulty tying shoes. Toileting: Independent Bathing: Pt. Is able to engage her right hand. Tub Shower transfers: Supervision Equipment: See above for equipment     IADLs: Shopping:  Has not had the opportunity for grocery shopping yet Light housekeeping: Husband is assisting with light house keeping Meal Prep:  Dependent Community mobility: Relies of family/friends Medication management: Husband assisting with weekly pillbox set-up, and administering medication. Financial management: TBD Handwriting: 75% legible   MOBILITY STATUS: Hx of falls   POSTURE COMMENTS:  No Significant postural limitations Sitting balance: supported sitting balance WFL   ACTIVITY TOLERANCE: Activity tolerance:  Fatigues in greater than 30 min.    FUNCTIONAL OUTCOME MEASURES: FOTO: 57   UPPER EXTREMITY ROM      Active ROM Right Eval: WFL Left eval Left  01/22/23  Shoulder flexion   132 100  Shoulder abduction   80 85  Shoulder  adduction       Shoulder extension       Shoulder internal rotation       Shoulder external rotation       Elbow flexion   140 140  Elbow extension   WNL WNL  Wrist flexion   65 68  Wrist extension   -10 20  Wrist ulnar deviation     12  Wrist radial deviation     8  Wrist pronation       Wrist supination       (Blank rows = not tested)   Left digit flexion to [facility]: 2nd: 0cm, 3rd: 0cm, 4th: 0cm, 5th: 0cm   Limited Left full 2nd digit extension     UPPER EXTREMITY MMT:      MMT Right Eval: 4+/5 overall Left Eval Left Eval  Shoulder flexion   3/5 3-/5  Shoulder abduction   3-/5 3-/5  Shoulder adduction       Shoulder extension       Shoulder internal rotation       Shoulder external rotation       Middle trapezius       Lower trapezius       Elbow flexion   3+/5 4/5  Elbow extension   3+/5 4/4  Wrist flexion       Wrist extension   2-/5 3-/5  Wrist ulnar deviation       Wrist radial deviation       Wrist pronation       Wrist supination       (Blank rows = not tested)   HAND FUNCTION: Grip strength: Right: 26#, Left: 10# Pinch strength: Right 8#, Left: 3#, 3 Pt. Pinch strength: Right: 9#, L: 2#  01/22/2023 Grip strength: Right: 26#, Left: 12# Pinch strength:  Pinch meter used at the initial eval has been sent out for recalibration     COORDINATION: Right: 22 sec., Left: <5 min. To place 7 pegs with increased compensation proximally in the trunk, and through reflexive associated reactions.  01/22/23 Right: 22 sec., Left: 3 min. & 4 sec.   SENSATION: Light touch: WFL, proprioceptive awareness: Intact   EDEMA: N/A   MUSCLE TONE: LUE: Hypotonic   COGNITION: Overall cognitive status: WFL for tasks assessed. Pt. Is impulsive at times.   VISION: Subjective report: Pt. report having shingles affecting left eye  s/p 1 year. Has nerve pain Baseline vision: Wears glasses for  reading only Visual history:  updated see clinical impression   VISION ASSESSMENT:     WFL for tasks performed   PERCEPTION: Intact   PRAXIS: Impaired: Motor planning   OBSERVATIONS:  Pt. More alert, and engaging since prior to the most recent hospitalization.     TODAY'S TREATMENT:    Pt. worked on grasping, and manipulating 1", 3/4", and 1/2" washers from a magnetic dish for resistance using isolated left 2nd digit extension. Pt. worked on [employer], and sorting the discs. Emphasis was placed on isolating the 2nd digit extension, and moving the washers to the edge of the dish to meet the thumb when grasping the washers.         PATIENT EDUCATION: Education details: left hand digit extension Person educated: Patient and Spouse Education method: Customer service manager Education comprehension: verbalized understanding, returned demonstration, and needs further education     HOME EXERCISE PROGRAM:    Reviewed activities at home to promote isolated 2nd digit extension.       GOALS: Goals reviewed with patient? Yes   SHORT TERM GOALS: Target date: 01/27/2023       1. Patient will be independent with home exercise program for the left upper extremity Baseline: 02/01/2023: Pt. Consistently attempts to perform HEPs independently. No current home exercise program Goal status: Ongoing     LONG TERM GOALS: Target date: 03/10/2023     Patient will improve left shoulder strength by 2 mm grades to be able to sustain UE's in elevation long enough to wash her hair.  Baseline: 01/22/2023: Left shoulder flexion: 3-/5, abduction: 3-/5 Eval: Left shoulder flexion: 3/5, abduction: 3-/5 Goal status: Ongoing   2.  Patient will improve left shoulder active abduction to be able to comb her hair Baseline: 01/22/2023: Shoulder abduction: 85 Eval: Left shoulder abduction is 80(108) Goal status: Ongoing   3.  Patient will independently button her shirt with modified independence. Baseline: 01/22/2023: Pt. continues to have difficulty. Eval: Patient has difficulty.  Goal  status: Ongoing   4.  Patient well improve left grip strength in preparation  for securely holding flowers. Baseline: 01/22/2023: Left: 12# Eval: Pt. Is unable to securely hold flowers. Goal status: Ongoing   5.  Pt. will independently recall adaptive  strategies for performing ADL tasks including: flossing teeth, donning bra, applying makeup. Baseline: 01/22/2023: Pt. continues to benefit from education about adaptive strategies during ADLs, and IADLs. Eval: Pt. to be provided with adaptive strategies. Goal status: Ongoing   6.  Pt. will improve Foto score by 2 points to reflect patient perceived performance improvement assessment specific ADLs  and IADLs FOTO score:67 Eval: 57 Goal status: Ongoing  7.  Pt. will improve left hand coordination skills in order to be able to handle, and sort utensils in a drawer.     Baseline: 01/22/2022: Left: 3 min. & 4 sec. Eval: Pt. has difficulty sorting, and placing utensils with the left hand. Left FMC : >5 min. For 7 pegs on the 9 hole peg test.    Goal Status:  Ongoing  8. Pt. will improve active left 2nd digit extension to be able able to isolate her 2nd digit in preparation for pressing/pushing buttons on appliances, phones, or remotes. Baseline: 01/22/2023: s able to perform full digit extension, although 2nd digit is slow to extend s 2/2 flexor tone. Pt.  Eval: Pt. is able to is unable to actively perform full digit extension Goal status:  Ongoing   ASSESSMENT:   CLINICAL IMPRESSION:  Pt. Reports feeling tired, and sleepy lately. Pt. Was sleeping prior to therapy to day.  Pt. Reports having an MD appointment tomorrow with Dr. Letta Pate. Pt. Tolerated Left UE AAROM stretches prior to [facility] tasks. Pt. continues to present with a history of left shoulder pain, and limitations. Pt. Continues to compensate proximally, and from the RUE &LE when attempting to perform [facility] skills with the left hand. Pt. Continues to require cues to  formulate isolate 2nd  digit extension, however is making excellent progress with isolating 2nd digit extension more consistently. Pt. continues to work on improving left shoulder elbow and wrist strength, improving motor control and coordination in order to prepare the left upper extremity and hand for functional reaching, and engaging the left upper extremity when performing hair care, buttoning, securely holding flowers, cutting food, and manipulating/handling/sorting utensils from a drawer for setting a table, as well as adaptive and compensatory strategies during daily ADL and IADL care.     PERFORMANCE DEFICITS in functional skills including ADLs, IADLs, coordination, proprioception, ROM, strength, [place], and GMC, cognitive skills including memory, and psychosocial skills including coping strategies, environmental adaptation, interpersonal interactions, and routines and behaviors.    IMPAIRMENTS are limiting patient from ADLs, IADLs, education, leisure, and social participation.    COMORBIDITIES may have co-morbidities  that affects occupational performance. Patient will benefit from skilled OT to address above impairments and improve overall function.   MODIFICATION OR ASSISTANCE TO COMPLETE EVALUATION: Min-Moderate modification of tasks or assist with assess necessary to complete an evaluation.   OT OCCUPATIONAL PROFILE AND HISTORY: Detailed assessment: Review of records and additional review of physical, cognitive, psychosocial history related to current functional performance.   CLINICAL DECISION MAKING: Moderate - several treatment options, min-mod task modification necessary   REHAB POTENTIAL: Good   EVALUATION COMPLEXITY: Moderate      PLAN: OT FREQUENCY: 2x/week   OT DURATION: 12 weeks   PLANNED INTERVENTIONS: self care/ADL training, therapeutic exercise, therapeutic activity, neuromuscular re-education, manual therapy, passive range of motion, functional mobility training, electrical stimulation, and  paraffin   RECOMMENDED OTHER SERVICES: PT   CONSULTED AND AGREED WITH PLAN OF CARE: Patient and family member/caregiver   PLAN FOR NEXT SESSION: Initiate OT treatment  Harrel Carina, MS, OTR/L   Harrel Carina, OT 01/29/2023, 5:25 PM

## 2023-01-30 ENCOUNTER — Encounter: Payer: Self-pay | Admitting: Physical Medicine & Rehabilitation

## 2023-01-30 ENCOUNTER — Encounter: Payer: Medicare HMO | Attending: Physical Medicine and Rehabilitation | Admitting: Physical Medicine & Rehabilitation

## 2023-01-30 DIAGNOSIS — M1710 Unilateral primary osteoarthritis, unspecified knee: Secondary | ICD-10-CM | POA: Diagnosis not present

## 2023-01-30 NOTE — Progress Notes (Signed)
Subjective:    Patient ID: [name], female    DOB: Sep 27, 1941, 82 y.o.   MRN: [id number]  82 y.o. right-handed female with history of recent stroke with left-sided weakness maintained on aspirin and Plavix with loop recorder placement received inpatient rehab services 08/22/2022 - 09/05/2022 discharge to home ambulating supervision level, hypertension hyperlipidemia, pancreatitis, chronic anemia, diastolic congestive heart failure.  Per chart review lives with spouse.  Presented to [facility] 11/23/2022 with increasing left-sided weakness nausea vomiting nonspecific abdominal pain.  Cranial CT scan negative for acute changes.  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  New compared to 08/19/2022.  No other intracranial arterial occlusion or high-grade stenosis.  Patient did not receive tPA.  MRI of the brain showed extensive acute nonhemorrhagic infarct involving the posterior inferior cerebellum bilaterally right greater than left.  A 9 mm right occipital pole infarction.  Punctate white matter infarct in the left occipital lobe.  Additional punctate cortical infarct in the medial right occipital lobe more superiorly.  Echocardiogram with ejection fraction of 55 to 60% no wall motion abnormalities grade 1 diastolic dysfunction.  CT abdomen pelvis showed 8.4 cm masslike inflammatory edematous changes in the anterior pelvic fat cephalad to the urinary bladder possibly contusion versus neoplasm.  Admission chemistries unremarkable except glucose 179 creatinine 1.79 urine drug screen negative.  Neurology follow-up initially placed on intravenous heparin currently remained on aspirin and Plavix 21 days then Plavix alone for CVA prophylaxis.  Lovenox added for DVT prophylaxis.  Gastroenterology services Dr. Virgina Castillo in regards to coffee-ground emesis with CT abdomen pelvis reviewed not felt to be GI bleed consider CTA if  worsening but no plan for endoscopy or further workup at this time.  Patient was cleared to continue aspirin and Plavix therapy.  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  AKI improved with gentle IV fluids latest creatinine 1.28.  Question aspiration pneumonia initially on Unasyn transition to Augmentin finishing course.  Cardiology interrogated loop recorder no events noted.  Therapy evaluations completed due to patient decreased functional ability left-sided weakness was admitted for a comprehensive rehab program. HPI Patient is undergoing outpatient PT OT.  She has been limited by left knee pain.  She was scheduled for left knee replacement prior to stroke.  She has done well with the right knee replacement.  With her husband.  She needs assistance with bathing household duties and shopping.  She does not drive.  She uses a transport chair for office visits.  Patient would like to get left knee replaced elective surgery after stroke usual timeframe is 6 months or greater.  She would also need to see neurology to discuss stopping Plavix for surgery.  We discussed other treatments for her knee she would like to hold off on injections, she is interested in a custom molded knee orthosis. Pain Inventory Average Pain 0 Pain Right Now 0 My pain is  n/a  LOCATION OF PAIN  n/a  BOWEL Number of stools per week: 1 Oral laxative use No  Type of laxative n/a Enema or suppository use No  History of colostomy No  Incontinent No   BLADDER Pads In and out cath, frequency . Able to self cath  . Bladder incontinence Yes  Frequent urination No  Leakage with coughing No  Difficulty starting stream No  Incomplete bladder emptying No  Mobility walk with assistance use a walker ability to climb steps?  yes do you drive?  no  Function retired  Neuro/Psych bladder control problems trouble walking confusion  Prior Studies Any changes since last visit?   no  Physicians involved in your care .   Family History  Problem Relation Age of Onset   Anesthesia problems Father        "bad lungs" couldn't wake him up   Heart disease Father    Heart attack Father 10   Breast cancer Maternal Grandmother    Breast cancer Maternal Aunt        x 2   Breast cancer Cousin    Pancreatic cancer Cousin    Heart attack Paternal Uncle    Stroke Paternal Grandfather    Social History   Socioeconomic History   Marital status: Married    Spouse name: [name]   Number of children: 1   Years of education: Not on file   Highest education level: Not on file  Occupational History   Occupation: retired  Tobacco Use   Smoking status: Never   Smokeless tobacco: Never  Vaping Use   Vaping Use: Never used  Substance and Sexual Activity   Alcohol use: Not Currently    Alcohol/week: 0.0 - 1.0 standard drinks of alcohol    Comment: wine once every 2-3 months   Drug use: Never   Sexual activity: Not on file  Other Topics Concern   Not on file  Social History Narrative   Not on file   Social Determinants of Health   Financial Resource Strain: Not on file  Food Insecurity: No Food Insecurity (11/23/2022)   Hunger Vital Sign    Worried About Running Out of Food in the Last Year: Never true    [place] in the Last Year: Never true  Transportation Needs: No Transportation Needs (11/23/2022)   PRAPARE - Hydrologist (Medical): No    Lack of Transportation (Non-Medical): No  Physical Activity: Not on file  Stress: Not on file  Social Connections: Not on file   Past Surgical History:  Procedure Laterality Date   ABDOMINAL HYSTERECTOMY     APPENDECTOMY     CARPAL TUNNEL RELEASE  10+ years ago   bilateral   EYE SURGERY  3 yrs ago   bilateral cataracts   FOOT OSTEOTOMY  6 weeks ago   Left foot: great, 2nd & 3rd   FOOT OSTEOTOMY  5 years ago   Right great toe   HAND SURGERY Bilateral 2011-most recent   multiple  hand surgeries, 2 on left, 3 on right   KNEE ARTHROPLASTY Right 04/28/2022   Procedure: COMPUTER ASSISTED TOTAL KNEE ARTHROPLASTY;  Surgeon: Dereck Leep, MD;  Location: [facility];  Service: Orthopedics;  Laterality: Right;   LOOP RECORDER INSERTION N/A 05/16/2020   Procedure: LOOP RECORDER INSERTION;  Surgeon: Isaias Cowman, MD;  Location: [place] [facility];  Service: Cardiovascular;  Laterality: N/A;   NASAL SINUS SURGERY  most recent 7-8 yrs ago   7 sinus surgeries    TRIGGER FINGER RELEASE  11/19/2011   Procedure: RELEASE TRIGGER FINGER/A-1 PULLEY;  Surgeon: Wynonia Sours, MD;  Location: [place];  Service: Orthopedics;  Laterality: Right;  release a-1 pulley right index finger and cyst removal   WRIST GANGLION EXCISION  1980's   right   Past Medical History:  Diagnosis Date   Anemia    Arthritis  Asthma    uses inhaler just prior to surgery to avoid attack   Back pain    from previous injury   Complication of anesthesia    has woken  up during 2 different surgery   Depression    no current issue/treatment; situation   Gallstones    GERD (gastroesophageal reflux disease)    Hiatal hernia    patient does NOT have nerve/muscle disease   History of kidney stones    HLD (hyperlipidemia)    HTN (hypertension)    Hypothyroidism    Kidney stones    Knee pain    Nausea and vomiting 10/15/2022   Non-diabetic pancreatic hormone dysfunction years   pt. 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   Pancreatitis    Pneumonia    Seizures ([place])    caused by dye injected during a procedure   Shortness of breath    with exertion   Sinus problem    frequent infections/congestion   Stroke ([place]) 2021   reports having CVA in 2021 and having mini strokes before that   Thyroid disease    BP 120/80   Pulse 82   Ht '4\' 11"'$  (1.499 m)   Wt 122 lb (55.3 kg)   SpO2 96%   BMI 24.64 kg/m   Opioid Risk Score:   Fall Risk Score:  `1  Depression screen [facility]  2/9     10/15/2022    1:02 PM 09/17/2022    1:49 PM  Depression screen PHQ 2/9  Decreased Interest 0 0  Down, Depressed, Hopeless 0 0  PHQ - 2 Score 0 0  Altered sleeping  3  Tired, decreased energy  3  Change in appetite  0  Feeling bad or failure about yourself   0  Trouble concentrating  0  Moving slowly or fidgety/restless  0  Suicidal thoughts  0  PHQ-9 Score  6  Difficult doing work/chores  Somewhat difficult     Review of Systems  Musculoskeletal:        Left knee  All other systems reviewed and are negative.      Objective:   Physical Exam  General no acute distress Mood and affect are appropriate Strength is 3/5 left deltoid bicep tricep grip 4/5 left hip flexor knee extensor ankle dorsiflexor Left knee has no evidence of range of motion within functional limits. Ambulation not tested she does not have her walker with her today. Tone is normal in the upper and lower limbs with exception of the right index finger flexor which is MAS 2 Speech without dysarthria or aphasia      Assessment & Plan:   1.  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  Continue outpatient therapy at [facility] PT OT. We discussed timing of left knee replacement and at earliest would be in end of June.  This would need to be discussed with both neurology and orthopedics. In the meantime would recommend left custom molded knee orthosis to help with left knee pain given the fact that opioids would increase fall risk, NSAIDs would increase stroke risk, she declines cortisone injections. Return to clinic 3 months

## 2023-02-03 ENCOUNTER — Encounter: Payer: Medicare HMO | Admitting: Speech Pathology

## 2023-02-03 ENCOUNTER — Ambulatory Visit: Payer: Medicare HMO

## 2023-02-03 ENCOUNTER — Ambulatory Visit: Payer: Medicare HMO | Admitting: Occupational Therapy

## 2023-02-03 DIAGNOSIS — M6281 Muscle weakness (generalized): Secondary | ICD-10-CM

## 2023-02-03 DIAGNOSIS — R278 Other lack of coordination: Secondary | ICD-10-CM

## 2023-02-04 NOTE — Therapy (Signed)
Occupational Therapy Treatment Note  Patient Name: [name] MRN: [id number] DOB:1941-05-22, 82 y.o., female Today's Date: 02/04/2023  PCP: Dr. Doy Hutching REFERRING PROVIDER:  Dr. Doy Hutching  END OF SESSION:  OT End of Session - 02/04/23 1030     Visit Number 13    Number of Visits 24    Date for OT Re-Evaluation 03/10/23    Authorization Time Period Progress report period starting 12/16/2022    OT Start Time 1645    OT Stop Time 1730    OT Time Calculation (min) 45 min    Activity Tolerance Patient limited by fatigue    Behavior During Therapy WFL for tasks assessed/performed               Past Medical History:  Diagnosis Date   Anemia    Arthritis    Asthma    uses inhaler just prior to surgery to avoid attack   Back pain    from previous injury   Complication of anesthesia    has woken  up during 2 different surgery   Depression    no current issue/treatment; situation   Gallstones    GERD (gastroesophageal reflux disease)    Hiatal hernia    patient does NOT have nerve/muscle disease   History of kidney stones    HLD (hyperlipidemia)    HTN (hypertension)    Hypothyroidism    Kidney stones    Knee pain    Nausea and vomiting 10/15/2022   Non-diabetic pancreatic hormone dysfunction years   pt. 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   Pancreatitis    Pneumonia    Seizures (HCC)    caused by dye injected during a procedure   Shortness of breath    with exertion   Sinus problem    frequent infections/congestion   Stroke ([place]) 2021   reports having CVA in 2021 and having mini strokes before that   Thyroid disease    Past Surgical History:  Procedure Laterality Date   ABDOMINAL HYSTERECTOMY     APPENDECTOMY     CARPAL TUNNEL RELEASE  10+ years ago   bilateral   EYE SURGERY  3 yrs ago   bilateral cataracts   FOOT OSTEOTOMY  6 weeks ago   Left foot: great, 2nd & 3rd   FOOT OSTEOTOMY  5 years ago   Right great toe   HAND SURGERY Bilateral  2011-most recent   multiple hand surgeries, 2 on left, 3 on right   KNEE ARTHROPLASTY Right 04/28/2022   Procedure: COMPUTER ASSISTED TOTAL KNEE ARTHROPLASTY;  Surgeon: Dereck Leep, MD;  Location: [facility];  Service: Orthopedics;  Laterality: Right;   LOOP RECORDER INSERTION N/A 05/16/2020   Procedure: LOOP RECORDER INSERTION;  Surgeon: Isaias Cowman, MD;  Location: [facility];  Service: Cardiovascular;  Laterality: N/A;   NASAL SINUS SURGERY  most recent 7-8 yrs ago   7 sinus surgeries    TRIGGER FINGER RELEASE  11/19/2011   Procedure: RELEASE TRIGGER FINGER/A-1 PULLEY;  Surgeon: Wynonia Sours, MD;  Location: [facility];  Service: Orthopedics;  Laterality: Right;  release a-1 pulley right index finger and cyst removal   WRIST GANGLION EXCISION  1980's   right   Patient Active Problem List   Diagnosis Date Noted   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 11/27/2022   Hypomagnesemia 11/27/2022   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   HLD (hyperlipidemia) 11/23/2022   Asthma 11/23/2022   Depression  with anxiety 11/23/2022   Chronic diastolic CHF (congestive heart failure) ([place]) 11/23/2022   Normocytic anemia 11/23/2022   Aspiration pneumonia ([place]) 11/23/2022   AKI (acute kidney injury) ([place]) 11/23/2022   Abdominal pain 11/23/2022   Mesenteric mass 11/23/2022   Coffee ground emesis 11/23/2022   Nausea and vomiting 10/15/2022   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   Fatigue 09/17/2022   Left hemiparesis ([place]) 09/17/2022   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[place]) 08/22/2022   GERD (gastroesophageal reflux disease) 08/21/2022   Agitation 08/20/2022   Acute left-sided weakness 08/20/2022   Expressive aphasia    Stroke ([place]) 08/19/2022   Leukocytosis 08/19/2022   History of urticaria 04/28/2022   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   Primary osteoarthritis of left knee 02/24/2022   Primary osteoarthritis of right knee 02/24/2022    Lumbar spondylolysis 04/12/2020   History of CVA (cerebrovascular accident) 03/26/2020   Low back pain radiating to right lower extremity 03/21/2020   B12 deficiency 03/06/2020   Positive anti-CCP test 12/21/2019   Arthralgia 12/13/2019   Dermatitis 12/13/2019   Rheumatoid factor positive 12/13/2019   Essential hypertension 12/11/2018   Palpitations 12/11/2018   Acquired hypothyroidism 11/10/2018   Arthritis of knee 09/17/2016   Anxiety 11/22/2014   Asthma without status asthmaticus 11/22/2014   Benign neoplasm of colon, unspecified 11/22/2014   Environmental allergies 11/22/2014   Hypertriglyceridemia 11/22/2014   Hypokalemia 11/22/2014   Personal history of disease of skin and subcutaneous tissue 11/22/2014   REFERRING DIAG: CVA   THERAPY DIAG:  Muscle weakness (generalized)   Other lack of coordination   Rationale for Evaluation and Treatment Rehabilitation   SUBJECTIVE:   Pt. doing well today.   SUBJECTIVE STATEMENT:  Pt.  reports having had a good physician appointment last week. Pt. Reports having a new order for a custom knee orthosis from [employer].    Pt accompanied by: significant other   PERTINENT HISTORY: Patient is an 82 year-old female who was admitted to [facility] on 11/23/2022 with a cerebellar CVA. 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 Pt. Was admitted to Inpatient Rehab from 12/21-1/02/2023. 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  Patient underwent inpatient rehabilitation for 2 weeks.  Patient was assessed and was scheduled for a knee replacement on 08/29/2022 however had to cancel it due to having had a CVA.  Patient had a recent fall 2 days after discharging from inpatient rehab. Past Medical History includes: Knee replacement, essential HTN, hypokalemia, leukocytosis, seizures, positive anti-- CCP test, anxiety disorder, mini strokes.  Patient had shingles with left eye  nerve pain s/p 1 year ago.    PRECAUTIONS: Fall   WEIGHT BEARING RESTRICTIONS No   PAIN:  Are you having pain?  I/10 in the left shoulder   FALLS: Has patient fallen in last 6 months? Yes. Number of falls 1   LIVING ENVIRONMENT: Lives with: Lives with Spouse Lives in: House/apartment Stairs: 2 storey home, resides on the first floor.  External: 2 stairs front no rails, and 6 in back with rails Has following equipment at home: Single point cane, Walker - 2 wheeled, Environmental consultant - 4 wheeled, Shower bench, and bed side commode   PLOF: Independent   PATIENT GOALS  To Regain the use of her left arm   OBJECTIVE:    HAND DOMINANCE: Right   ADLs: Overall ADLs: Husband assists pt. as needed Transfers/ambulation related to ADLs:Pt. Uses a 3 wheeled walker with Husband assist. Eating: Pt. Is  independent with the right hand. Pt. has difficulty cutting food. Grooming: Pt. is using her right hand, however has difficulty sustaining her LUE in elevation to assist with haircare. UB Dressing: Pt. Is independent donning a pullover shirt, and button down shirt. Has difficulty with buttoning, LB Dressing:  Independent donning pants, and socks. Difficulty tying shoes. Toileting: Independent Bathing: Pt. Is able to engage her right hand. Tub Shower transfers: Supervision Equipment: See above for equipment     IADLs: Shopping:  Has not had the opportunity for grocery shopping yet Light housekeeping: Husband is assisting with light house keeping Meal Prep:  Dependent Community mobility: Relies of family/friends Medication management: Husband assisting with weekly pillbox set-up, and administering medication. Financial management: TBD Handwriting: 75% legible   MOBILITY STATUS: Hx of falls   POSTURE COMMENTS:  No Significant postural limitations Sitting balance: supported sitting balance WFL   ACTIVITY TOLERANCE: Activity tolerance:  Fatigues in greater than 30 min.    FUNCTIONAL OUTCOME  MEASURES: FOTO: 57   UPPER EXTREMITY ROM      Active ROM Right Eval: WFL Left eval Left  01/22/23  Shoulder flexion   132 100  Shoulder abduction   80 85  Shoulder adduction       Shoulder extension       Shoulder internal rotation       Shoulder external rotation       Elbow flexion   140 140  Elbow extension   WNL WNL  Wrist flexion   65 68  Wrist extension   -10 20  Wrist ulnar deviation     12  Wrist radial deviation     8  Wrist pronation       Wrist supination       (Blank rows = not tested)   Left digit flexion to [facility]: 2nd: 0cm, 3rd: 0cm, 4th: 0cm, 5th: 0cm   Limited Left full 2nd digit extension     UPPER EXTREMITY MMT:      MMT Right Eval: 4+/5 overall Left Eval Left Eval  Shoulder flexion   3/5 3-/5  Shoulder abduction   3-/5 3-/5  Shoulder adduction       Shoulder extension       Shoulder internal rotation       Shoulder external rotation       Middle trapezius       Lower trapezius       Elbow flexion   3+/5 4/5  Elbow extension   3+/5 4/4  Wrist flexion       Wrist extension   2-/5 3-/5  Wrist ulnar deviation       Wrist radial deviation       Wrist pronation       Wrist supination       (Blank rows = not tested)   HAND FUNCTION: Grip strength: Right: 26#, Left: 10# Pinch strength: Right 8#, Left: 3#, 3 Pt. Pinch strength: Right: 9#, L: 2#  01/22/2023 Grip strength: Right: 26#, Left: 12# Pinch strength:  Pinch meter used at the initial eval has been sent out for recalibration     COORDINATION: Right: 22 sec., Left: <5 min. To place 7 pegs with increased compensation proximally in the trunk, and through reflexive associated reactions.  01/22/23 Right: 22 sec., Left: 3 min. & 4 sec.   SENSATION: Light touch: WFL, proprioceptive awareness: Intact   EDEMA: N/A   MUSCLE TONE: LUE: Hypotonic   COGNITION: Overall cognitive status: WFL for tasks assessed.  Pt. Is impulsive at times.   VISION: Subjective report: Pt. report having  shingles affecting left eye  s/p 1 year. Has nerve pain Baseline vision: Wears glasses for reading only Visual history:  updated see clinical impression   VISION ASSESSMENT:    WFL for tasks performed   PERCEPTION: Intact   PRAXIS: Impaired: Motor planning   OBSERVATIONS:  Pt. More alert, and engaging since prior to the most recent hospitalization.     TODAY'S TREATMENT:    Neuromuscular re-education:  Pt. worked on grasping, and manipulating 1", 3/4", and 1/2" washers from a magnetic dish for resistance using isolated left 2nd digit extension.  And reaching up to place them onto assorted hooks facing various directions on a vertical angle. Emphasis was placed on isolating the 2nd digit extension, and moving the washers to the edge of the dish to meet the thumb when grasping the washers.   There. Ex:  Pt. worked on [employer], and reciprocal motion using the UBE while seated for 8 min. with no resistance. Constant monitoring was provided. Pt. Tolerated LUE ROM for shoulder flexion, and abduction.  Pt. reports having nausea lately. Pt. Tolerated Left UE AAROM stretches prior to [facility] tasks. Pt. continues to present with a history of left shoulder pain, and limitations. Pt. tolerated the UBE well today while seated. Pt. presented with increased flexor tightness in the left thumb IP joint limiting her ability to accurately grasp the washers from the magnetic dish. The task was modified to sliding washers off the edge of a flat elevated surface. The task required further modification to securing, and stabilizing thumb and digit position on 1" square cubes with resistance.  Pt. continues to work on improving left shoulder elbow and wrist strength, improving motor control and coordination in order to prepare the left upper extremity and hand for functional reaching, and engaging the left upper extremity when performing hair care, buttoning, securely holding flowers, cutting food, and  manipulating/handling/sorting utensils from a drawer for setting a table, as well as adaptive and compensatory strategies during daily ADL and IADL care.     PATIENT EDUCATION: Education details: left hand digit extension Person educated: Patient and Spouse Education method: Customer service manager Education comprehension: verbalized understanding, returned demonstration, and needs further education     HOME EXERCISE PROGRAM:    Reviewed activities at home to promote isolated 2nd digit extension.       GOALS: Goals reviewed with patient? Yes   SHORT TERM GOALS: Target date: 01/27/2023       1. Patient will be independent with home exercise program for the left upper extremity Baseline: 02/01/2023: Pt. Consistently attempts to perform HEPs independently. No current home exercise program Goal status: Ongoing     LONG TERM GOALS: Target date: 03/10/2023     Patient will improve left shoulder strength by 2 mm grades to be able to sustain UE's in elevation long enough to wash her hair.  Baseline: 01/22/2023: Left shoulder flexion: 3-/5, abduction: 3-/5 Eval: Left shoulder flexion: 3/5, abduction: 3-/5 Goal status: Ongoing   2.  Patient will improve left shoulder active abduction to be able to comb her hair Baseline: 01/22/2023: Shoulder abduction: 85 Eval: Left shoulder abduction is 80(108) Goal status: Ongoing   3.  Patient will independently button her shirt with modified independence. Baseline: 01/22/2023: Pt. continues to have difficulty. Eval: Patient has difficulty.  Goal status: Ongoing   4.  Patient well improve left grip strength in preparation  for securely holding  flowers. Baseline: 01/22/2023: Left: 12# Eval: Pt. Is unable to securely hold flowers. Goal status: Ongoing   5.  Pt. will independently recall adaptive  strategies for performing ADL tasks including: flossing teeth, donning bra, applying makeup. Baseline: 01/22/2023: Pt. continues to benefit from education  about adaptive strategies during ADLs, and IADLs. Eval: Pt. to be provided with adaptive strategies. Goal status: Ongoing   6.  Pt. will improve Foto score by 2 points to reflect patient perceived performance improvement assessment specific ADLs  and IADLs FOTO score:67 Eval: 57 Goal status: Ongoing  7.  Pt. will improve left hand coordination skills in order to be able to handle, and sort utensils in a drawer.     Baseline: 01/22/2022: Left: 3 min. & 4 sec. Eval: Pt. has difficulty sorting, and placing utensils with the left hand. Left FMC : >5 min. For 7 pegs on the 9 hole peg test.    Goal Status:  Ongoing  8. Pt. will improve active left 2nd digit extension to be able able to isolate her 2nd digit in preparation for pressing/pushing buttons on appliances, phones, or remotes. Baseline: 01/22/2023: s able to perform full digit extension, although 2nd digit is slow to extend s 2/2 flexor tone. Pt.  Eval: Pt. is able to is unable to actively perform full digit extension Goal status:  Ongoing   ASSESSMENT:   CLINICAL IMPRESSION:   Pt. reports having nausea lately. Pt. Tolerated Left UE AAROM stretches prior to [facility] tasks. Pt. continues to present with a history of left shoulder pain, and limitations. Pt. tolerated the UBE well today while seated. Pt. presented with increased flexor tightness in the left thumb IP joint limiting her ability to accurately grasp the washers from the magnetic dish. The task was modified to sliding washers off the edge of a flat elevated surface. The task required further modification to securing, and stabilizing thumb and digit position on 1" square cubes with resistance.  Pt. continues to work on improving left shoulder elbow and wrist strength, improving motor control and coordination in order to prepare the left upper extremity and hand for functional reaching, and engaging the left upper extremity when performing hair care, buttoning, securely holding flowers, cutting  food, and manipulating/handling/sorting utensils from a drawer for setting a table, as well as adaptive and compensatory strategies during daily ADL and IADL care.     PERFORMANCE DEFICITS in functional skills including ADLs, IADLs, coordination, proprioception, ROM, strength, [place], and GMC, cognitive skills including memory, and psychosocial skills including coping strategies, environmental adaptation, interpersonal interactions, and routines and behaviors.    IMPAIRMENTS are limiting patient from ADLs, IADLs, education, leisure, and social participation.    COMORBIDITIES may have co-morbidities  that affects occupational performance. Patient will benefit from skilled OT to address above impairments and improve overall function.   MODIFICATION OR ASSISTANCE TO COMPLETE EVALUATION: Min-Moderate modification of tasks or assist with assess necessary to complete an evaluation.   OT OCCUPATIONAL PROFILE AND HISTORY: Detailed assessment: Review of records and additional review of physical, cognitive, psychosocial history related to current functional performance.   CLINICAL DECISION MAKING: Moderate - several treatment options, min-mod task modification necessary   REHAB POTENTIAL: Good   EVALUATION COMPLEXITY: Moderate      PLAN: OT FREQUENCY: 2x/week   OT DURATION: 12 weeks   PLANNED INTERVENTIONS: self care/ADL training, therapeutic exercise, therapeutic activity, neuromuscular re-education, manual therapy, passive range of motion, functional mobility training, electrical stimulation, and paraffin   RECOMMENDED OTHER SERVICES:  PT   CONSULTED AND AGREED WITH PLAN OF CARE: Patient and family member/caregiver   PLAN FOR NEXT SESSION: Initiate OT treatment  Harrel Carina, MS, OTR/L   Harrel Carina, OT 02/04/2023, 10:51 AM

## 2023-02-05 ENCOUNTER — Ambulatory Visit: Payer: Medicare HMO

## 2023-02-05 ENCOUNTER — Encounter: Payer: Medicare HMO | Admitting: Speech Pathology

## 2023-02-05 ENCOUNTER — Ambulatory Visit: Payer: Medicare HMO | Admitting: Occupational Therapy

## 2023-02-10 ENCOUNTER — Encounter: Payer: Medicare HMO | Admitting: Speech Pathology

## 2023-02-10 ENCOUNTER — Ambulatory Visit: Payer: Medicare HMO | Attending: Physical Medicine and Rehabilitation | Admitting: Occupational Therapy

## 2023-02-10 ENCOUNTER — Ambulatory Visit: Payer: Medicare HMO

## 2023-02-10 DIAGNOSIS — M6281 Muscle weakness (generalized): Secondary | ICD-10-CM | POA: Insufficient documentation

## 2023-02-10 DIAGNOSIS — R278 Other lack of coordination: Secondary | ICD-10-CM | POA: Diagnosis not present

## 2023-02-11 DIAGNOSIS — E538 Deficiency of other specified B group vitamins: Secondary | ICD-10-CM | POA: Diagnosis not present

## 2023-02-11 DIAGNOSIS — I10 Essential (primary) hypertension: Secondary | ICD-10-CM | POA: Diagnosis not present

## 2023-02-11 DIAGNOSIS — Z79899 Other long term (current) drug therapy: Secondary | ICD-10-CM | POA: Diagnosis not present

## 2023-02-11 DIAGNOSIS — R7303 Prediabetes: Secondary | ICD-10-CM | POA: Diagnosis not present

## 2023-02-11 DIAGNOSIS — Z8673 Personal history of transient ischemic attack (TIA), and cerebral infarction without residual deficits: Secondary | ICD-10-CM | POA: Diagnosis not present

## 2023-02-11 DIAGNOSIS — Z0000 Encounter for general adult medical examination without abnormal findings: Secondary | ICD-10-CM | POA: Diagnosis not present

## 2023-02-11 DIAGNOSIS — E079 Disorder of thyroid, unspecified: Secondary | ICD-10-CM | POA: Diagnosis not present

## 2023-02-11 DIAGNOSIS — E785 Hyperlipidemia, unspecified: Secondary | ICD-10-CM | POA: Diagnosis not present

## 2023-02-11 NOTE — Therapy (Signed)
Occupational Therapy Treatment Note  Patient Name: [name] MRN: [id number] DOB:07/31/1941, 82 y.o., female Today's Date: 02/11/2023  PCP: Dr. Doy Hutching REFERRING PROVIDER:  Dr. Doy Hutching  END OF SESSION:  OT End of Session - 02/11/23 1040     Visit Number 14    Number of Visits 24    Date for OT Re-Evaluation 03/10/23    Authorization Time Period Progress report period starting 12/16/2022    OT Start Time 1653    OT Stop Time 1732    OT Time Calculation (min) 39 min    Activity Tolerance Patient limited by fatigue    Behavior During Therapy [facility] for tasks assessed/performed               Past Medical History:  Diagnosis Date   Anemia    Arthritis    Asthma    uses inhaler just prior to surgery to avoid attack   Back pain    from previous injury   Complication of anesthesia    has woken  up during 2 different surgery   Depression    no current issue/treatment; situation   Gallstones    GERD (gastroesophageal reflux disease)    Hiatal hernia    patient does NOT have nerve/muscle disease   History of kidney stones    HLD (hyperlipidemia)    HTN (hypertension)    Hypothyroidism    Kidney stones    Knee pain    Nausea and vomiting 10/15/2022   Non-diabetic pancreatic hormone dysfunction years   pt. 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   Pancreatitis    Pneumonia    Seizures (HCC)    caused by dye injected during a procedure   Shortness of breath    with exertion   Sinus problem    frequent infections/congestion   Stroke ([place]) 2021   reports having CVA in 2021 and having mini strokes before that   Thyroid disease    Past Surgical History:  Procedure Laterality Date   ABDOMINAL HYSTERECTOMY     APPENDECTOMY     CARPAL TUNNEL RELEASE  10+ years ago   bilateral   EYE SURGERY  3 yrs ago   bilateral cataracts   FOOT OSTEOTOMY  6 weeks ago   Left foot: great, 2nd & 3rd   FOOT OSTEOTOMY  5 years ago   Right great toe   HAND SURGERY Bilateral 2011-most  recent   multiple hand surgeries, 2 on left, 3 on right   KNEE ARTHROPLASTY Right 04/28/2022   Procedure: COMPUTER ASSISTED TOTAL KNEE ARTHROPLASTY;  Surgeon: Dereck Leep, MD;  Location: [facility];  Service: Orthopedics;  Laterality: Right;   LOOP RECORDER INSERTION N/A 05/16/2020   Procedure: LOOP RECORDER INSERTION;  Surgeon: Isaias Cowman, MD;  Location: [place] [facility];  Service: Cardiovascular;  Laterality: N/A;   NASAL SINUS SURGERY  most recent 7-8 yrs ago   7 sinus surgeries    TRIGGER FINGER RELEASE  11/19/2011   Procedure: RELEASE TRIGGER FINGER/A-1 PULLEY;  Surgeon: Wynonia Sours, MD;  Location: [place];  Service: Orthopedics;  Laterality: Right;  release a-1 pulley right index finger and cyst removal   WRIST GANGLION EXCISION  1980's   right   Patient Active Problem List   Diagnosis Date Noted   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 11/27/2022   Hypomagnesemia 11/27/2022   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   HLD (hyperlipidemia) 11/23/2022   Asthma 11/23/2022   Depression  with anxiety 11/23/2022   Chronic diastolic CHF (congestive heart failure) ([place]) 11/23/2022   Normocytic anemia 11/23/2022   Aspiration pneumonia ([place]) 11/23/2022   AKI (acute kidney injury) ([place]) 11/23/2022   Abdominal pain 11/23/2022   Mesenteric mass 11/23/2022   Coffee ground emesis 11/23/2022   Nausea and vomiting 10/15/2022   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   Fatigue 09/17/2022   Left hemiparesis ([place]) 09/17/2022   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[place]) 08/22/2022   GERD (gastroesophageal reflux disease) 08/21/2022   Agitation 08/20/2022   Acute left-sided weakness 08/20/2022   Expressive aphasia    Stroke ([place]) 08/19/2022   Leukocytosis 08/19/2022   History of urticaria 04/28/2022   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   Primary osteoarthritis of left knee 02/24/2022   Primary osteoarthritis of right knee 02/24/2022   Lumbar  spondylolysis 04/12/2020   History of CVA (cerebrovascular accident) 03/26/2020   Low back pain radiating to right lower extremity 03/21/2020   B12 deficiency 03/06/2020   Positive anti-CCP test 12/21/2019   Arthralgia 12/13/2019   Dermatitis 12/13/2019   Rheumatoid factor positive 12/13/2019   Essential hypertension 12/11/2018   Palpitations 12/11/2018   Acquired hypothyroidism 11/10/2018   Arthritis of knee 09/17/2016   Anxiety 11/22/2014   Asthma without status asthmaticus 11/22/2014   Benign neoplasm of colon, unspecified 11/22/2014   Environmental allergies 11/22/2014   Hypertriglyceridemia 11/22/2014   Hypokalemia 11/22/2014   Personal history of disease of skin and subcutaneous tissue 11/22/2014   REFERRING DIAG: CVA   THERAPY DIAG:  Muscle weakness (generalized)   Other lack of coordination   Rationale for Evaluation and Treatment Rehabilitation   SUBJECTIVE:   Pt.  Reports feeling much better than last week.   SUBJECTIVE STATEMENT:   Pt accompanied by: significant other   PERTINENT HISTORY: Patient is an 82 year-old female who was [facility] on 11/23/2022 with a cerebellar CVA. 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 Pt. Was admitted to Inpatient Rehab from 12/21-1/02/2023. 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  Patient underwent inpatient rehabilitation for 2 weeks.  Patient was assessed and was scheduled for a knee replacement on 08/29/2022 however had to cancel it due to having had a CVA.  Patient had a recent fall 2 days after discharging from inpatient rehab. Past Medical History includes: Knee replacement, essential HTN, hypokalemia, leukocytosis, seizures, positive anti-- CCP test, anxiety disorder, mini strokes.  Patient had shingles with left eye nerve pain s/p 1 year ago.    PRECAUTIONS: Fall   WEIGHT BEARING RESTRICTIONS No   PAIN:  Are you having pain?   I/10 in the left shoulder   FALLS: Has patient fallen in last 6 months? Yes. Number of falls 1   LIVING ENVIRONMENT: Lives with: Lives with Spouse Lives in: House/apartment Stairs: 2 storey home, resides on the first floor.  External: 2 stairs front no rails, and 6 in back with rails Has following equipment at home: Single point cane, Walker - 2 wheeled, Environmental consultant - 4 wheeled, Shower bench, and bed side commode   PLOF: Independent   PATIENT GOALS  To Regain the use of her left arm   OBJECTIVE:    HAND DOMINANCE: Right   ADLs: Overall ADLs: Husband assists pt. as needed Transfers/ambulation related to ADLs:Pt. Uses a 3 wheeled walker with Husband assist. Eating: Pt. Is independent with the right hand. Pt. has difficulty cutting food. Grooming: Pt. is using her right hand, however has difficulty sustaining  her LUE in elevation to assist with haircare. UB Dressing: Pt. Is independent donning a pullover shirt, and button down shirt. Has difficulty with buttoning, LB Dressing:  Independent donning pants, and socks. Difficulty tying shoes. Toileting: Independent Bathing: Pt. Is able to engage her right hand. Tub Shower transfers: Supervision Equipment: See above for equipment     IADLs: Shopping:  Has not had the opportunity for grocery shopping yet Light housekeeping: Husband is assisting with light house keeping Meal Prep:  Dependent Community mobility: Relies of family/friends Medication management: Husband assisting with weekly pillbox set-up, and administering medication. Financial management: TBD Handwriting: 75% legible   MOBILITY STATUS: Hx of falls   POSTURE COMMENTS:  No Significant postural limitations Sitting balance: supported sitting balance WFL   ACTIVITY TOLERANCE: Activity tolerance:  Fatigues in greater than 30 min.    FUNCTIONAL OUTCOME MEASURES: FOTO: 57   UPPER EXTREMITY ROM      Active ROM Right Eval: WFL Left eval Left  01/22/23  Shoulder flexion    132 100  Shoulder abduction   80 85  Shoulder adduction       Shoulder extension       Shoulder internal rotation       Shoulder external rotation       Elbow flexion   140 140  Elbow extension   WNL WNL  Wrist flexion   65 68  Wrist extension   -10 20  Wrist ulnar deviation     12  Wrist radial deviation     8  Wrist pronation       Wrist supination       (Blank rows = not tested)   Left digit flexion to [facility]: 2nd: 0cm, 3rd: 0cm, 4th: 0cm, 5th: 0cm   Limited Left full 2nd digit extension     UPPER EXTREMITY MMT:      MMT Right Eval: 4+/5 overall Left Eval Left Eval  Shoulder flexion   3/5 3-/5  Shoulder abduction   3-/5 3-/5  Shoulder adduction       Shoulder extension       Shoulder internal rotation       Shoulder external rotation       Middle trapezius       Lower trapezius       Elbow flexion   3+/5 4/5  Elbow extension   3+/5 4/4  Wrist flexion       Wrist extension   2-/5 3-/5  Wrist ulnar deviation       Wrist radial deviation       Wrist pronation       Wrist supination       (Blank rows = not tested)   HAND FUNCTION: Grip strength: Right: 26#, Left: 10# Pinch strength: Right 8#, Left: 3#, 3 Pt. Pinch strength: Right: 9#, L: 2#  01/22/2023 Grip strength: Right: 26#, Left: 12# Pinch strength:  Pinch meter used at the initial eval has been sent out for recalibration     COORDINATION: Right: 22 sec., Left: <5 min. To place 7 pegs with increased compensation proximally in the trunk, and through reflexive associated reactions.  01/22/23 Right: 22 sec., Left: 3 min. & 4 sec.   SENSATION: Light touch: WFL, proprioceptive awareness: Intact   EDEMA: N/A   MUSCLE TONE: LUE: Hypotonic   COGNITION: Overall cognitive status: WFL for tasks assessed. Pt. Is impulsive at times.   VISION: Subjective report: Pt. report having shingles affecting left eye  s/p 1 year.  Has nerve pain Baseline vision: Wears glasses for reading only Visual history:  updated  see clinical impression   VISION ASSESSMENT:    WFL for tasks performed   PERCEPTION: Intact   PRAXIS: Impaired: Motor planning   OBSERVATIONS:  Pt. More alert, and engaging since prior to the most recent hospitalization.     TODAY'S TREATMENT:    Neuromuscular re-education:   Pt. worked on isolating 2nd digit extension, while flexing the 3rd, 4th, and 5th digits for sliding 1" flat tile letters to the edge of the table to his 2nd digit, and thumb. Pt. scanning through letters placed randomly on a tabletop surface. Pt. Worked on visually scanning through the letters, and selecting the appropriate letters of her name.  There. Ex:  Pt. worked on [employer], and reciprocal motion using the UBE while seated for 8 min. with no resistance. Constant monitoring was provided. Pt. Tolerated LUE ROM for shoulder flexion, and abduction.  Pt. reports having nausea with bleeding last week. Pt. reports that her son came to visit. Pt. tolerated Left UE AAROM stretches prior to [facility] tasks. Pt. continues to present with a history of left shoulder pain, and limitations. Pt. tolerated the UBE well today while seated. Pt.was able to actively isolate the 2nd digit in extension consistently. Pt. requires cues for weightbearing through her left hand. Pt. continues to work on improving left shoulder elbow and wrist strength, improving motor control and coordination in order to prepare the left upper extremity and hand for functional reaching, and engaging the left upper extremity when performing hair care, buttoning, securely holding flowers, cutting food, and manipulating/handling/sorting utensils from a drawer for setting a table, as well as adaptive and compensatory strategies during daily ADL and IADL care.     PATIENT EDUCATION: Education details: left hand digit extension Person educated: Patient and Spouse Education method: Customer service manager Education comprehension: verbalized  understanding, returned demonstration, and needs further education     HOME EXERCISE PROGRAM:    Reviewed activities at home to promote isolated 2nd digit extension.       GOALS: Goals reviewed with patient? Yes   SHORT TERM GOALS: Target date: 01/27/2023       1. Patient will be independent with home exercise program for the left upper extremity Baseline: 02/01/2023: Pt. Consistently attempts to perform HEPs independently. No current home exercise program Goal status: Ongoing     LONG TERM GOALS: Target date: 03/10/2023     Patient will improve left shoulder strength by 2 mm grades to be able to sustain UE's in elevation long enough to wash her hair.  Baseline: 01/22/2023: Left shoulder flexion: 3-/5, abduction: 3-/5 Eval: Left shoulder flexion: 3/5, abduction: 3-/5 Goal status: Ongoing   2.  Patient will improve left shoulder active abduction to be able to comb her hair Baseline: 01/22/2023: Shoulder abduction: 85 Eval: Left shoulder abduction is 80(108) Goal status: Ongoing   3.  Patient will independently button her shirt with modified independence. Baseline: 01/22/2023: Pt. continues to have difficulty. Eval: Patient has difficulty.  Goal status: Ongoing   4.  Patient well improve left grip strength in preparation  for securely holding flowers. Baseline: 01/22/2023: Left: 12# Eval: Pt. Is unable to securely hold flowers. Goal status: Ongoing   5.  Pt. will independently recall adaptive  strategies for performing ADL tasks including: flossing teeth, donning bra, applying makeup. Baseline: 01/22/2023: Pt. continues to benefit from education about adaptive strategies during ADLs, and IADLs. Eval: Pt. to be  provided with adaptive strategies. Goal status: Ongoing   6.  Pt. will improve Foto score by 2 points to reflect patient perceived performance improvement assessment specific ADLs  and IADLs FOTO score:67 Eval: 57 Goal status: Ongoing  7.  Pt. will improve left hand  coordination skills in order to be able to handle, and sort utensils in a drawer.     Baseline: 01/22/2022: Left: 3 min. & 4 sec. Eval: Pt. has difficulty sorting, and placing utensils with the left hand. Left FMC : >5 min. For 7 pegs on the 9 hole peg test.    Goal Status:  Ongoing  8. Pt. will improve active left 2nd digit extension to be able able to isolate her 2nd digit in preparation for pressing/pushing buttons on appliances, phones, or remotes. Baseline: 01/22/2023: s able to perform full digit extension, although 2nd digit is slow to extend s 2/2 flexor tone. Pt.  Eval: Pt. is able to is unable to actively perform full digit extension Goal status:  Ongoing   ASSESSMENT:   CLINICAL IMPRESSION:   Pt. reports having nausea with bleeding last week. Pt. reports that her son came to visit. Pt. tolerated Left UE AAROM stretches prior to [facility] tasks. Pt. continues to present with a history of left shoulder pain, and limitations. Pt. tolerated the UBE well today while seated. Pt.was able to actively isolate the 2nd digit in extension consistently. Pt. requires cues for weightbearing through her left hand. Pt. continues to work on improving left shoulder elbow and wrist strength, improving motor control and coordination in order to prepare the left upper extremity and hand for functional reaching, and engaging the left upper extremity when performing hair care, buttoning, securely holding flowers, cutting food, and manipulating/handling/sorting utensils from a drawer for setting a table, as well as adaptive and compensatory strategies during daily ADL and IADL care.     PERFORMANCE DEFICITS in functional skills including ADLs, IADLs, coordination, proprioception, ROM, strength, Butler, and GMC, cognitive skills including memory, and psychosocial skills including coping strategies, environmental adaptation, interpersonal interactions, and routines and behaviors.    IMPAIRMENTS are limiting patient from ADLs,  IADLs, education, leisure, and social participation.    COMORBIDITIES may have co-morbidities  that affects occupational performance. Patient will benefit from skilled OT to address above impairments and improve overall function.   MODIFICATION OR ASSISTANCE TO COMPLETE EVALUATION: Min-Moderate modification of tasks or assist with assess necessary to complete an evaluation.   OT OCCUPATIONAL PROFILE AND HISTORY: Detailed assessment: Review of records and additional review of physical, cognitive, psychosocial history related to current functional performance.   CLINICAL DECISION MAKING: Moderate - several treatment options, min-mod task modification necessary   REHAB POTENTIAL: Good   EVALUATION COMPLEXITY: Moderate      PLAN: OT FREQUENCY: 2x/week   OT DURATION: 12 weeks   PLANNED INTERVENTIONS: self care/ADL training, therapeutic exercise, therapeutic activity, neuromuscular re-education, manual therapy, passive range of motion, functional mobility training, electrical stimulation, and paraffin   RECOMMENDED OTHER SERVICES: PT   CONSULTED AND AGREED WITH PLAN OF CARE: Patient and family member/caregiver   PLAN FOR NEXT SESSION: Initiate OT treatment  Harrel Carina, MS, OTR/L   Harrel Carina, OT 02/11/2023, 10:46 AM

## 2023-02-12 ENCOUNTER — Ambulatory Visit: Payer: Medicare HMO

## 2023-02-12 ENCOUNTER — Encounter: Payer: Medicare HMO | Admitting: Speech Pathology

## 2023-02-12 ENCOUNTER — Ambulatory Visit: Payer: Medicare HMO | Admitting: Occupational Therapy

## 2023-02-12 DIAGNOSIS — M6281 Muscle weakness (generalized): Secondary | ICD-10-CM

## 2023-02-12 DIAGNOSIS — R278 Other lack of coordination: Secondary | ICD-10-CM | POA: Diagnosis not present

## 2023-02-12 DIAGNOSIS — R8290 Unspecified abnormal findings in urine: Secondary | ICD-10-CM | POA: Diagnosis not present

## 2023-02-12 NOTE — Therapy (Signed)
Occupational Therapy Treatment Note  Patient Name: [name] MRN: [id number] DOB:[birth year], 82 y.o., female Today's Date: 02/12/2023  PCP: Dr. Doy Hutching REFERRING PROVIDER:  Dr. Doy Hutching  END OF SESSION:  OT End of Session - 02/12/23 1756     Visit Number 15    Number of Visits 24    Date for OT Re-Evaluation 03/10/23    Authorization Time Period Progress report period starting 12/16/2022    OT Start Time 1345    OT Stop Time 1430    OT Time Calculation (min) 45 min    Activity Tolerance Patient limited by fatigue    Behavior During Therapy [facility] for tasks assessed/performed               Past Medical History:  Diagnosis Date   Anemia    Arthritis    Asthma    uses inhaler just prior to surgery to avoid attack   Back pain    from previous injury   Complication of anesthesia    has woken  up during 2 different surgery   Depression    no current issue/treatment; situation   Gallstones    GERD (gastroesophageal reflux disease)    Hiatal hernia    patient does NOT have nerve/muscle disease   History of kidney stones    HLD (hyperlipidemia)    HTN (hypertension)    Hypothyroidism    Kidney stones    Knee pain    Nausea and vomiting 10/15/2022   Non-diabetic pancreatic hormone dysfunction years   pt. 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   Pancreatitis    Pneumonia    Seizures (HCC)    caused by dye injected during a procedure   Shortness of breath    with exertion   Sinus problem    frequent infections/congestion   Stroke ([place]) 2021   reports having CVA in 2021 and having mini strokes before that   Thyroid disease    Past Surgical History:  Procedure Laterality Date   ABDOMINAL HYSTERECTOMY     APPENDECTOMY     CARPAL TUNNEL RELEASE  10+ years ago   bilateral   EYE SURGERY  3 yrs ago   bilateral cataracts   FOOT OSTEOTOMY  6 weeks ago   Left foot: great, 2nd & 3rd   FOOT OSTEOTOMY  5 years ago   Right great toe   HAND SURGERY Bilateral 2011-most  recent   multiple hand surgeries, 2 on left, 3 on right   KNEE ARTHROPLASTY Right 04/28/2022   Procedure: COMPUTER ASSISTED TOTAL KNEE ARTHROPLASTY;  Surgeon: Dereck Leep, MD;  Location: [facility];  Service: Orthopedics;  Laterality: Right;   LOOP RECORDER INSERTION N/A 05/16/2020   Procedure: LOOP RECORDER INSERTION;  Surgeon: Isaias Cowman, MD;  Location: [place] [facility];  Service: Cardiovascular;  Laterality: N/A;   NASAL SINUS SURGERY  most recent 7-8 yrs ago   7 sinus surgeries    TRIGGER FINGER RELEASE  11/19/2011   Procedure: RELEASE TRIGGER FINGER/A-1 PULLEY;  Surgeon: Wynonia Sours, MD;  Location: [place];  Service: Orthopedics;  Laterality: Right;  release a-1 pulley right index finger and cyst removal   WRIST GANGLION EXCISION  1980's   right   Patient Active Problem List   Diagnosis Date Noted   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 11/27/2022   Hypomagnesemia 11/27/2022   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   HLD (hyperlipidemia) 11/23/2022   Asthma 11/23/2022   Depression  with anxiety 11/23/2022   Chronic diastolic CHF (congestive heart failure) ([place]) 11/23/2022   Normocytic anemia 11/23/2022   Aspiration pneumonia ([place]) 11/23/2022   AKI (acute kidney injury) ([place]) 11/23/2022   Abdominal pain 11/23/2022   Mesenteric mass 11/23/2022   Coffee ground emesis 11/23/2022   Nausea and vomiting 10/15/2022   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   Fatigue 09/17/2022   Left hemiparesis ([place]) 09/17/2022   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[place]) 08/22/2022   GERD (gastroesophageal reflux disease) 08/21/2022   Agitation 08/20/2022   Acute left-sided weakness 08/20/2022   Expressive aphasia    Stroke ([place]) 08/19/2022   Leukocytosis 08/19/2022   History of urticaria 04/28/2022   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   Primary osteoarthritis of left knee 02/24/2022   Primary osteoarthritis of right knee 02/24/2022   Lumbar  spondylolysis 04/12/2020   History of CVA (cerebrovascular accident) 03/26/2020   Low back pain radiating to right lower extremity 03/21/2020   B12 deficiency 03/06/2020   Positive anti-CCP test 12/21/2019   Arthralgia 12/13/2019   Dermatitis 12/13/2019   Rheumatoid factor positive 12/13/2019   Essential hypertension 12/11/2018   Palpitations 12/11/2018   Acquired hypothyroidism 11/10/2018   Arthritis of knee 09/17/2016   Anxiety 11/22/2014   Asthma without status asthmaticus 11/22/2014   Benign neoplasm of colon, unspecified 11/22/2014   Environmental allergies 11/22/2014   Hypertriglyceridemia 11/22/2014   Hypokalemia 11/22/2014   Personal history of disease of skin and subcutaneous tissue 11/22/2014   REFERRING DIAG: CVA   THERAPY DIAG:  Muscle weakness (generalized)   Other lack of coordination   Rationale for Evaluation and Treatment Rehabilitation   SUBJECTIVE:   Pt.  Reports feeling much better than last week.   SUBJECTIVE STATEMENT:   Pt accompanied by: significant other   PERTINENT HISTORY: Patient is an 83 year-old female who was admitted to [facility] on 11/23/2022 with a cerebellar CVA. 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 Pt. Was admitted to Inpatient Rehab from 12/21-1/02/2023. 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  Patient underwent inpatient rehabilitation for 2 weeks.  Patient was assessed and was scheduled for a knee replacement on 08/29/2022 however had to cancel it due to having had a CVA.  Patient had a recent fall 2 days after discharging from inpatient rehab. Past Medical History includes: Knee replacement, essential HTN, hypokalemia, leukocytosis, seizures, positive anti-- CCP test, anxiety disorder, mini strokes.  Patient had shingles with left eye nerve pain s/p 1 year ago.    PRECAUTIONS: Fall   WEIGHT BEARING RESTRICTIONS No   PAIN:  Are you having pain?   No   FALLS: Has patient fallen in last 6 months? Yes. Number of falls 1   LIVING ENVIRONMENT: Lives with: Lives with Spouse Lives in: House/apartment Stairs: 2 storey home, resides on the first floor.  External: 2 stairs front no rails, and 6 in back with rails Has following equipment at home: Single point cane, Walker - 2 wheeled, Environmental consultant - 4 wheeled, Shower bench, and bed side commode   PLOF: Independent   PATIENT GOALS  To Regain the use of her left arm   OBJECTIVE:    HAND DOMINANCE: Right   ADLs: Overall ADLs: Husband assists pt. as needed Transfers/ambulation related to ADLs:Pt. Uses a 3 wheeled walker with Husband assist. Eating: Pt. Is independent with the right hand. Pt. has difficulty cutting food. Grooming: Pt. is using her right hand, however has difficulty sustaining her LUE in elevation  to assist with haircare. UB Dressing: Pt. Is independent donning a pullover shirt, and button down shirt. Has difficulty with buttoning, LB Dressing:  Independent donning pants, and socks. Difficulty tying shoes. Toileting: Independent Bathing: Pt. Is able to engage her right hand. Tub Shower transfers: Supervision Equipment: See above for equipment     IADLs: Shopping:  Has not had the opportunity for grocery shopping yet Light housekeeping: Husband is assisting with light house keeping Meal Prep:  Dependent Community mobility: Relies of family/friends Medication management: Husband assisting with weekly pillbox set-up, and administering medication. Financial management: TBD Handwriting: 75% legible   MOBILITY STATUS: Hx of falls   POSTURE COMMENTS:  No Significant postural limitations Sitting balance: supported sitting balance WFL   ACTIVITY TOLERANCE: Activity tolerance:  Fatigues in greater than 30 min.    FUNCTIONAL OUTCOME MEASURES: FOTO: 57   UPPER EXTREMITY ROM      Active ROM Right Eval: WFL Left eval Left  01/22/23  Shoulder flexion   132 100  Shoulder  abduction   80 85  Shoulder adduction       Shoulder extension       Shoulder internal rotation       Shoulder external rotation       Elbow flexion   140 140  Elbow extension   WNL WNL  Wrist flexion   65 68  Wrist extension   -10 20  Wrist ulnar deviation     12  Wrist radial deviation     8  Wrist pronation       Wrist supination       (Blank rows = not tested)   Left digit flexion to [facility]: 2nd: 0cm, 3rd: 0cm, 4th: 0cm, 5th: 0cm   Limited Left full 2nd digit extension     UPPER EXTREMITY MMT:      MMT Right Eval: 4+/5 overall Left Eval Left Eval  Shoulder flexion   3/5 3-/5  Shoulder abduction   3-/5 3-/5  Shoulder adduction       Shoulder extension       Shoulder internal rotation       Shoulder external rotation       Middle trapezius       Lower trapezius       Elbow flexion   3+/5 4/5  Elbow extension   3+/5 4/4  Wrist flexion       Wrist extension   2-/5 3-/5  Wrist ulnar deviation       Wrist radial deviation       Wrist pronation       Wrist supination       (Blank rows = not tested)   HAND FUNCTION: Grip strength: Right: 26#, Left: 10# Pinch strength: Right 8#, Left: 3#, 3 Pt. Pinch strength: Right: 9#, L: 2#  01/22/2023 Grip strength: Right: 26#, Left: 12# Pinch strength:  Pinch meter used at the initial eval has been sent out for recalibration     COORDINATION: Right: 22 sec., Left: <5 min. To place 7 pegs with increased compensation proximally in the trunk, and through reflexive associated reactions.  01/22/23 Right: 22 sec., Left: 3 min. & 4 sec.   SENSATION: Light touch: WFL, proprioceptive awareness: Intact   EDEMA: N/A   MUSCLE TONE: LUE: Hypotonic   COGNITION: Overall cognitive status: WFL for tasks assessed. Pt. Is impulsive at times.   VISION: Subjective report: Pt. report having shingles affecting left eye  s/p 1 year. Has nerve pain Baseline  vision: Wears glasses for reading only Visual history:  updated see clinical  impression   VISION ASSESSMENT:    WFL for tasks performed   PERCEPTION: Intact   PRAXIS: Impaired: Motor planning   OBSERVATIONS:  Pt. More alert, and engaging since prior to the most recent hospitalization.     TODAY'S TREATMENT:   There. Ex:  Pt. worked on [employer], and reciprocal motion using the UBE while seated for 8 min. with no resistance. Constant monitoring was provided. Pt. tolerated LUE ROM for shoulder flexion, and abduction. Pt. worked on functional reaching for [employer], and worked on moving them through 3 horizontal rungs of progressively increasing heights on the [employer]. Pt. Worked on alternating proprioception through the LUE, and hand during the task to normalize tone.  Pt. reports  being so tired lately. Pt. tolerated Left UE AAROM stretches prior to [facility] tasks. Pt. tolerated the UBE well today while seated. Pt. Was able to reach higher with less support required proximally at the elbow. Pt. was able to actively isolate the 2nd digit in extension consistently. Pt. Presented with increased thumb IP flexion. Pt. requires cues for weightbearing through her left hand. Pt. continues to work on improving left shoulder elbow and wrist strength, improving motor control and coordination in order to prepare the left upper extremity and hand for functional reaching, and engaging the left upper extremity when performing hair care, buttoning, securely holding flowers, cutting food, and manipulating/handling/sorting utensils from a drawer for setting a table, as well as adaptive and compensatory strategies during daily ADL and IADL care.     PATIENT EDUCATION: Education details: left hand digit extension Person educated: Patient and Spouse Education method: Customer service manager Education comprehension: verbalized understanding, returned demonstration, and needs further education     HOME EXERCISE PROGRAM:    Reviewed activities at home to promote isolated  2nd digit extension.       GOALS: Goals reviewed with patient? Yes   SHORT TERM GOALS: Target date: 01/27/2023       1. Patient will be independent with home exercise program for the left upper extremity Baseline: 02/01/2023: Pt. Consistently attempts to perform HEPs independently. No current home exercise program Goal status: Ongoing     LONG TERM GOALS: Target date: 03/10/2023     Patient will improve left shoulder strength by 2 mm grades to be able to sustain UE's in elevation long enough to wash her hair.  Baseline: 01/22/2023: Left shoulder flexion: 3-/5, abduction: 3-/5 Eval: Left shoulder flexion: 3/5, abduction: 3-/5 Goal status: Ongoing   2.  Patient will improve left shoulder active abduction to be able to comb her hair Baseline: 01/22/2023: Shoulder abduction: 85 Eval: Left shoulder abduction is 80(108) Goal status: Ongoing   3.  Patient will independently button her shirt with modified independence. Baseline: 01/22/2023: Pt. continues to have difficulty. Eval: Patient has difficulty.  Goal status: Ongoing   4.  Patient well improve left grip strength in preparation  for securely holding flowers. Baseline: 01/22/2023: Left: 12# Eval: Pt. Is unable to securely hold flowers. Goal status: Ongoing   5.  Pt. will independently recall adaptive  strategies for performing ADL tasks including: flossing teeth, donning bra, applying makeup. Baseline: 01/22/2023: Pt. continues to benefit from education about adaptive strategies during ADLs, and IADLs. Eval: Pt. to be provided with adaptive strategies. Goal status: Ongoing   6.  Pt. will improve Foto score by 2 points to reflect patient perceived performance improvement assessment specific ADLs  and IADLs FOTO score:67 Eval: 57 Goal status: Ongoing  7.  Pt. will improve left hand coordination skills in order to be able to handle, and sort utensils in a drawer.     Baseline: 01/22/2022: Left: 3 min. & 4 sec. Eval: Pt. has difficulty  sorting, and placing utensils with the left hand. Left FMC : >5 min. For 7 pegs on the 9 hole peg test.    Goal Status:  Ongoing  8. Pt. will improve active left 2nd digit extension to be able able to isolate her 2nd digit in preparation for pressing/pushing buttons on appliances, phones, or remotes. Baseline: 01/22/2023: s able to perform full digit extension, although 2nd digit is slow to extend s 2/2 flexor tone. Pt.  Eval: Pt. is able to is unable to actively perform full digit extension Goal status:  Ongoing   ASSESSMENT:   CLINICAL IMPRESSION:   Pt. reports  being so tired lately. Pt. tolerated Left UE AAROM stretches prior to [facility] tasks. Pt. tolerated the UBE well today while seated. Pt. Was able to reach higher with less support required proximally at the elbow. Pt. was able to actively isolate the 2nd digit in extension consistently. Pt. Presented with increased thumb IP flexion. Pt. requires cues for weightbearing through her left hand. Pt. continues to work on improving left shoulder elbow and wrist strength, improving motor control and coordination in order to prepare the left upper extremity and hand for functional reaching, and engaging the left upper extremity when performing hair care, buttoning, securely holding flowers, cutting food, and manipulating/handling/sorting utensils from a drawer for setting a table, as well as adaptive and compensatory strategies during daily ADL and IADL care.     PERFORMANCE DEFICITS in functional skills including ADLs, IADLs, coordination, proprioception, ROM, strength, [place], and GMC, cognitive skills including memory, and psychosocial skills including coping strategies, environmental adaptation, interpersonal interactions, and routines and behaviors.    IMPAIRMENTS are limiting patient from ADLs, IADLs, education, leisure, and social participation.    COMORBIDITIES may have co-morbidities  that affects occupational performance. Patient will benefit  from skilled OT to address above impairments and improve overall function.   MODIFICATION OR ASSISTANCE TO COMPLETE EVALUATION: Min-Moderate modification of tasks or assist with assess necessary to complete an evaluation.   OT OCCUPATIONAL PROFILE AND HISTORY: Detailed assessment: Review of records and additional review of physical, cognitive, psychosocial history related to current functional performance.   CLINICAL DECISION MAKING: Moderate - several treatment options, min-mod task modification necessary   REHAB POTENTIAL: Good   EVALUATION COMPLEXITY: Moderate      PLAN: OT FREQUENCY: 2x/week   OT DURATION: 12 weeks   PLANNED INTERVENTIONS: self care/ADL training, therapeutic exercise, therapeutic activity, neuromuscular re-education, manual therapy, passive range of motion, functional mobility training, electrical stimulation, and paraffin   RECOMMENDED OTHER SERVICES: PT   CONSULTED AND AGREED WITH PLAN OF CARE: Patient and family member/caregiver   PLAN FOR NEXT SESSION: Initiate OT treatment  Harrel Carina, MS, OTR/L   Harrel Carina, OT 02/12/2023, 6:00 PM

## 2023-02-17 ENCOUNTER — Encounter: Payer: Medicare HMO | Admitting: Speech Pathology

## 2023-02-17 ENCOUNTER — Ambulatory Visit: Payer: Medicare HMO

## 2023-02-17 ENCOUNTER — Ambulatory Visit: Payer: Medicare HMO | Admitting: Occupational Therapy

## 2023-02-17 DIAGNOSIS — R278 Other lack of coordination: Secondary | ICD-10-CM | POA: Diagnosis not present

## 2023-02-17 DIAGNOSIS — Z0100 Encounter for examination of eyes and vision without abnormal findings: Secondary | ICD-10-CM | POA: Diagnosis not present

## 2023-02-17 DIAGNOSIS — Z961 Presence of intraocular lens: Secondary | ICD-10-CM | POA: Diagnosis not present

## 2023-02-17 DIAGNOSIS — M6281 Muscle weakness (generalized): Secondary | ICD-10-CM | POA: Diagnosis not present

## 2023-02-17 DIAGNOSIS — B0230 Zoster ocular disease, unspecified: Secondary | ICD-10-CM | POA: Diagnosis not present

## 2023-02-18 NOTE — Therapy (Signed)
Occupational Therapy Treatment Note  Patient Name: [name] MRN: [id number] DOB:09-Dec-1940, 82 y.o., female Today's Date: 02/18/2023  PCP: Dr. Doy Hutching REFERRING PROVIDER:  Dr. Doy Hutching  END OF SESSION:  OT End of Session - 02/18/23 1357     Visit Number 16    Number of Visits 24    Date for OT Re-Evaluation 03/10/23    Authorization Time Period Progress report period starting 12/16/2022    OT Start Time 1645    OT Stop Time 1730    OT Time Calculation (min) 45 min    Activity Tolerance Patient limited by fatigue    Behavior During Therapy WFL for tasks assessed/performed               Past Medical History:  Diagnosis Date   Anemia    Arthritis    Asthma    uses inhaler just prior to surgery to avoid attack   Back pain    from previous injury   Complication of anesthesia    has woken  up during 2 different surgery   Depression    no current issue/treatment; situation   Gallstones    GERD (gastroesophageal reflux disease)    Hiatal hernia    patient does NOT have nerve/muscle disease   History of kidney stones    HLD (hyperlipidemia)    HTN (hypertension)    Hypothyroidism    Kidney stones    Knee pain    Nausea and vomiting 10/15/2022   Non-diabetic pancreatic hormone dysfunction years   pt. 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   Pancreatitis    Pneumonia    Seizures (HCC)    caused by dye injected during a procedure   Shortness of breath    with exertion   Sinus problem    frequent infections/congestion   Stroke ([place]) 2021   reports having CVA in 2021 and having mini strokes before that   Thyroid disease    Past Surgical History:  Procedure Laterality Date   ABDOMINAL HYSTERECTOMY     APPENDECTOMY     CARPAL TUNNEL RELEASE  10+ years ago   bilateral   EYE SURGERY  3 yrs ago   bilateral cataracts   FOOT OSTEOTOMY  6 weeks ago   Left foot: great, 2nd & 3rd   FOOT OSTEOTOMY  5 years ago   Right great toe   HAND SURGERY Bilateral  2011-most recent   multiple hand surgeries, 2 on left, 3 on right   KNEE ARTHROPLASTY Right 04/28/2022   Procedure: COMPUTER ASSISTED TOTAL KNEE ARTHROPLASTY;  Surgeon: Dereck Leep, MD;  Location: [facility];  Service: Orthopedics;  Laterality: Right;   LOOP RECORDER INSERTION N/A 05/16/2020   Procedure: LOOP RECORDER INSERTION;  Surgeon: Isaias Cowman, MD;  Location: [place];  Service: Cardiovascular;  Laterality: N/A;   NASAL SINUS SURGERY  most recent 7-8 yrs ago   7 sinus surgeries    TRIGGER FINGER RELEASE  11/19/2011   Procedure: RELEASE TRIGGER FINGER/A-1 PULLEY;  Surgeon: Wynonia Sours, MD;  Location: [place];  Service: Orthopedics;  Laterality: Right;  release a-1 pulley right index finger and cyst removal   WRIST GANGLION EXCISION  1980's   right   Patient Active Problem List   Diagnosis Date Noted   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) 11/23/2022   Normocytic anemia 11/23/2022   Aspiration pneumonia ([place]) 11/23/2022   AKI (acute kidney injury) ([place]) 11/23/2022   Abdominal pain 11/23/2022   Mesenteric mass 11/23/2022   Coffee ground emesis 11/23/2022   Nausea and vomiting 10/15/2022   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   Fatigue 09/17/2022   Left hemiparesis ([place]) 09/17/2022   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[place]) 08/22/2022   GERD (gastroesophageal reflux disease) 08/21/2022   Agitation 08/20/2022   Acute left-sided weakness 08/20/2022   Expressive aphasia    Stroke ([place]) 08/19/2022   Leukocytosis 08/19/2022   History of urticaria 04/28/2022   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   Primary osteoarthritis of left knee 02/24/2022   Primary osteoarthritis of right knee 02/24/2022    Lumbar spondylolysis 04/12/2020   History of CVA (cerebrovascular accident) 03/26/2020   Low back pain radiating to right lower extremity 03/21/2020   B12 deficiency 03/06/2020   Positive anti-CCP test 12/21/2019   Arthralgia 12/13/2019   Dermatitis 12/13/2019   Rheumatoid factor positive 12/13/2019   Essential hypertension 12/11/2018   Palpitations 12/11/2018   Acquired hypothyroidism 11/10/2018   Arthritis of knee 09/17/2016   Anxiety 11/22/2014   Asthma without status asthmaticus 11/22/2014   Benign neoplasm of colon, unspecified 11/22/2014   Environmental allergies 11/22/2014   Hypertriglyceridemia 11/22/2014   Hypokalemia 11/22/2014   Personal history of disease of skin and subcutaneous tissue 11/22/2014   REFERRING DIAG: CVA   THERAPY DIAG:  Muscle weakness (generalized)   Other lack of coordination   Rationale for Evaluation and Treatment Rehabilitation   SUBJECTIVE:   Pt.  Reports having had an eye appointment earlier today.   SUBJECTIVE STATEMENT:   Pt accompanied by: significant other   PERTINENT HISTORY: Patient is an 82 year-old female who was admitted to [facility] on 11/23/2022 with a cerebellar CVA. 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 Pt. Was admitted to Inpatient Rehab from 12/21-1/02/2023. 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  Patient underwent inpatient rehabilitation for 2 weeks.  Patient was assessed and was scheduled for a knee replacement on 08/29/2022 however had to cancel it due to having had a CVA.  Patient had a recent fall 2 days after discharging from inpatient rehab. Past Medical History includes: Knee replacement, essential HTN, hypokalemia, leukocytosis, seizures, positive anti-- CCP test, anxiety disorder, mini strokes.  Patient had shingles with left eye nerve pain s/p 1 year ago.    PRECAUTIONS: Fall   WEIGHT BEARING RESTRICTIONS No   PAIN:  Are  you having pain?  No   FALLS: Has patient fallen in last 6 months? Yes. Number of falls 1   LIVING ENVIRONMENT: Lives with: Lives with Spouse Lives in: House/apartment Stairs: 2 storey home, resides on the first floor.  External: 2 stairs front no rails, and 6 in back with rails Has following equipment at home: Single point cane, Walker - 2 wheeled, Environmental consultant - 4 wheeled, Shower bench, and bed side commode   PLOF: Independent   PATIENT GOALS  To Regain the use of her left arm   OBJECTIVE:    HAND DOMINANCE: Right   ADLs: Overall ADLs: Husband assists pt. as needed Transfers/ambulation related to ADLs:Pt. Uses a 3 wheeled walker with Husband assist. Eating: Pt. Is independent with the right hand. Pt. has difficulty cutting food. Grooming: Pt. is using her right hand, however has difficulty sustaining her LUE in  elevation to assist with haircare. UB Dressing: Pt. Is independent donning a pullover shirt, and button down shirt. Has difficulty with buttoning, LB Dressing:  Independent donning pants, and socks. Difficulty tying shoes. Toileting: Independent Bathing: Pt. Is able to engage her right hand. Tub Shower transfers: Supervision Equipment: See above for equipment     IADLs: Shopping:  Has not had the opportunity for grocery shopping yet Light housekeeping: Husband is assisting with light house keeping Meal Prep:  Dependent Community mobility: Relies of family/friends Medication management: Husband assisting with weekly pillbox set-up, and administering medication. Financial management: TBD Handwriting: 75% legible   MOBILITY STATUS: Hx of falls   POSTURE COMMENTS:  No Significant postural limitations Sitting balance: supported sitting balance WFL   ACTIVITY TOLERANCE: Activity tolerance:  Fatigues in greater than 30 min.    FUNCTIONAL OUTCOME MEASURES: FOTO: 57   UPPER EXTREMITY ROM      Active ROM Right Eval: WFL Left eval Left  01/22/23  Shoulder flexion    132 100  Shoulder abduction   80 85  Shoulder adduction       Shoulder extension       Shoulder internal rotation       Shoulder external rotation       Elbow flexion   140 140  Elbow extension   WNL WNL  Wrist flexion   65 68  Wrist extension   -10 20  Wrist ulnar deviation     12  Wrist radial deviation     8  Wrist pronation       Wrist supination       (Blank rows = not tested)   Left digit flexion to [facility]: 2nd: 0cm, 3rd: 0cm, 4th: 0cm, 5th: 0cm   Limited Left full 2nd digit extension     UPPER EXTREMITY MMT:      MMT Right Eval: 4+/5 overall Left Eval Left Eval  Shoulder flexion   3/5 3-/5  Shoulder abduction   3-/5 3-/5  Shoulder adduction       Shoulder extension       Shoulder internal rotation       Shoulder external rotation       Middle trapezius       Lower trapezius       Elbow flexion   3+/5 4/5  Elbow extension   3+/5 4/4  Wrist flexion       Wrist extension   2-/5 3-/5  Wrist ulnar deviation       Wrist radial deviation       Wrist pronation       Wrist supination       (Blank rows = not tested)   HAND FUNCTION: Grip strength: Right: 26#, Left: 10# Pinch strength: Right 8#, Left: 3#, 3 Pt. Pinch strength: Right: 9#, L: 2#  01/22/2023 Grip strength: Right: 26#, Left: 12# Pinch strength:  Pinch meter used at the initial eval has been sent out for recalibration     COORDINATION: Right: 22 sec., Left: <5 min. To place 7 pegs with increased compensation proximally in the trunk, and through reflexive associated reactions.  01/22/23 Right: 22 sec., Left: 3 min. & 4 sec.   SENSATION: Light touch: WFL, proprioceptive awareness: Intact   EDEMA: N/A   MUSCLE TONE: LUE: Hypotonic   COGNITION: Overall cognitive status: WFL for tasks assessed. Pt. Is impulsive at times.   VISION: Subjective report: Pt. report having shingles affecting left eye  s/p 1 year. Has nerve pain  Baseline vision: Wears glasses for reading only Visual history:  updated  see clinical impression   VISION ASSESSMENT:    WFL for tasks performed   PERCEPTION: Intact   PRAXIS: Impaired: Motor planning   OBSERVATIONS:  Pt. More alert, and engaging since prior to the most recent hospitalization.     TODAY'S TREATMENT:   There. Ex:  Pt. worked on [employer], and reciprocal motion using the UBE while seated for 8 min. with no resistance. Constant monitoring was provided. Pt. tolerated AAROM throughout the LUE, and hand.  Pt. continues to tolerate Left UE AAROM stretches prior to [facility] tasks. Pt. tolerated the UBE well today while seated. Pt. was able to reach higher with less support required proximally at the elbow. Pt. was able to actively isolate the 2nd digit in extension consistently. Pt. presented with increased 2nd digit flexor tightness when challenging pinch strength. Pt. requires cues for proprioception, and weightbearing through her left hand. Pt. continues to work on improving left shoulder elbow and wrist strength, improving motor control and coordination in order to prepare the left upper extremity and hand for functional reaching, and engaging the left upper extremity when performing hair care, buttoning, securely holding flowers, cutting food, and manipulating/handling/sorting utensils from a drawer for setting a table, as well as adaptive and compensatory strategies during daily ADL and IADL care.     PATIENT EDUCATION: Education details: left hand digit extension Person educated: Patient and Spouse Education method: Customer service manager Education comprehension: verbalized understanding, returned demonstration, and needs further education     HOME EXERCISE PROGRAM:    Reviewed activities at home to promote isolated 2nd digit extension.       GOALS: Goals reviewed with patient? Yes   SHORT TERM GOALS: Target date: 01/27/2023       1. Patient will be independent with home exercise program for the left upper  extremity Baseline: 02/01/2023: Pt. Consistently attempts to perform HEPs independently. No current home exercise program Goal status: Ongoing     LONG TERM GOALS: Target date: 03/10/2023     Patient will improve left shoulder strength by 2 mm grades to be able to sustain UE's in elevation long enough to wash her hair.  Baseline: 01/22/2023: Left shoulder flexion: 3-/5, abduction: 3-/5 Eval: Left shoulder flexion: 3/5, abduction: 3-/5 Goal status: Ongoing   2.  Patient will improve left shoulder active abduction to be able to comb her hair Baseline: 01/22/2023: Shoulder abduction: 85 Eval: Left shoulder abduction is 80(108) Goal status: Ongoing   3.  Patient will independently button her shirt with modified independence. Baseline: 01/22/2023: Pt. continues to have difficulty. Eval: Patient has difficulty.  Goal status: Ongoing   4.  Patient well improve left grip strength in preparation  for securely holding flowers. Baseline: 01/22/2023: Left: 12# Eval: Pt. Is unable to securely hold flowers. Goal status: Ongoing   5.  Pt. will independently recall adaptive  strategies for performing ADL tasks including: flossing teeth, donning bra, applying makeup. Baseline: 01/22/2023: Pt. continues to benefit from education about adaptive strategies during ADLs, and IADLs. Eval: Pt. to be provided with adaptive strategies. Goal status: Ongoing   6.  Pt. will improve Foto score by 2 points to reflect patient perceived performance improvement assessment specific ADLs  and IADLs FOTO score:67 Eval: 57 Goal status: Ongoing  7.  Pt. will improve left hand coordination skills in order to be able to handle, and sort utensils in a drawer.     Baseline: 01/22/2022:  Left: 3 min. & 4 sec. Eval: Pt. has difficulty sorting, and placing utensils with the left hand. Left FMC : >5 min. For 7 pegs on the 9 hole peg test.    Goal Status:  Ongoing  8. Pt. will improve active left 2nd digit extension to be able able to  isolate her 2nd digit in preparation for pressing/pushing buttons on appliances, phones, or remotes. Baseline: 01/22/2023: s able to perform full digit extension, although 2nd digit is slow to extend s 2/2 flexor tone. Pt.  Eval: Pt. is able to is unable to actively perform full digit extension Goal status:  Ongoing   ASSESSMENT:   CLINICAL IMPRESSION:  Pt. continues to tolerate Left UE AAROM stretches prior to [facility] tasks. Pt. tolerated the UBE well today while seated. Pt. was able to reach higher with less support required proximally at the elbow. Pt. was able to actively isolate the 2nd digit in extension consistently. Pt. presented with increased 2nd digit flexor tightness when challenging pinch strength. Pt. requires cues for proprioception, and weightbearing through her left hand. Pt. continues to work on improving left shoulder elbow and wrist strength, improving motor control and coordination in order to prepare the left upper extremity and hand for functional reaching, and engaging the left upper extremity when performing hair care, buttoning, securely holding flowers, cutting food, and manipulating/handling/sorting utensils from a drawer for setting a table, as well as adaptive and compensatory strategies during daily ADL and IADL care.  PERFORMANCE DEFICITS in functional skills including ADLs, IADLs, coordination, proprioception, ROM, strength, Nice, and GMC, cognitive skills including memory, and psychosocial skills including coping strategies, environmental adaptation, interpersonal interactions, and routines and behaviors.    IMPAIRMENTS are limiting patient from ADLs, IADLs, education, leisure, and social participation.    COMORBIDITIES may have co-morbidities  that affects occupational performance. Patient will benefit from skilled OT to address above impairments and improve overall function.   MODIFICATION OR ASSISTANCE TO COMPLETE EVALUATION: Min-Moderate modification of tasks or assist  with assess necessary to complete an evaluation.   OT OCCUPATIONAL PROFILE AND HISTORY: Detailed assessment: Review of records and additional review of physical, cognitive, psychosocial history related to current functional performance.   CLINICAL DECISION MAKING: Moderate - several treatment options, min-mod task modification necessary   REHAB POTENTIAL: Good   EVALUATION COMPLEXITY: Moderate      PLAN: OT FREQUENCY: 2x/week   OT DURATION: 12 weeks   PLANNED INTERVENTIONS: self care/ADL training, therapeutic exercise, therapeutic activity, neuromuscular re-education, manual therapy, passive range of motion, functional mobility training, electrical stimulation, and paraffin   RECOMMENDED OTHER SERVICES: PT   CONSULTED AND AGREED WITH PLAN OF CARE: Patient and family member/caregiver   PLAN FOR NEXT SESSION: Initiate OT treatment  Harrel Carina, MS, OTR/L   Harrel Carina, OT 02/18/2023, 2:01 PM

## 2023-02-19 ENCOUNTER — Ambulatory Visit: Payer: Medicare HMO

## 2023-02-19 ENCOUNTER — Ambulatory Visit: Payer: Medicare HMO | Admitting: Occupational Therapy

## 2023-02-19 ENCOUNTER — Encounter: Payer: Medicare HMO | Admitting: Speech Pathology

## 2023-02-19 DIAGNOSIS — M6281 Muscle weakness (generalized): Secondary | ICD-10-CM

## 2023-02-19 DIAGNOSIS — R278 Other lack of coordination: Secondary | ICD-10-CM | POA: Diagnosis not present

## 2023-02-19 NOTE — Therapy (Signed)
Occupational Therapy Treatment Note  [name] Name: [name] MRN: [id number] DOB:10/04/1941, 82 y.o., female Today's Date: 02/19/2023  PCP: Dr. Doy Hutching REFERRING PROVIDER:  Dr. Doy Hutching  END OF SESSION:  OT End of Session - 02/19/23 1733     Visit Number 17    Number of Visits 24    Date for OT Re-Evaluation 03/10/23    Authorization Time Period Progress report period starting 12/16/2022    OT Start Time 1345    OT Stop Time 1430    OT Time Calculation (min) 45 min    Activity Tolerance [name] limited by fatigue    Behavior During Therapy [facility] for tasks assessed/performed               Past Medical History:  Diagnosis Date   Anemia    Arthritis    Asthma    uses inhaler just prior to surgery to avoid attack   Back pain    from previous injury   Complication of anesthesia    has woken  up during 2 different surgery   Depression    no current issue/treatment; situation   Gallstones    GERD (gastroesophageal reflux disease)    Hiatal hernia    [name] does NOT have nerve/muscle disease   History of kidney stones    HLD (hyperlipidemia)    HTN (hypertension)    Hypothyroidism    Kidney stones    Knee pain    Nausea and vomiting 10/15/2022   Non-diabetic pancreatic hormone dysfunction years   pt. 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   Pancreatitis    Pneumonia    Seizures (HCC)    caused by dye injected during a procedure   Shortness of breath    with exertion   Sinus problem    frequent infections/congestion   Stroke ([place]) 2021   reports having CVA in 2021 and having mini strokes before that   Thyroid disease    Past Surgical History:  Procedure Laterality Date   ABDOMINAL HYSTERECTOMY     APPENDECTOMY     CARPAL TUNNEL RELEASE  10+ years ago   bilateral   EYE SURGERY  3 yrs ago   bilateral cataracts   FOOT OSTEOTOMY  6 weeks ago   Left foot: great, 2nd & 3rd   FOOT OSTEOTOMY  5 years ago   Right great toe   HAND SURGERY Bilateral  2011-most recent   multiple hand surgeries, 2 on left, 3 on right   KNEE ARTHROPLASTY Right 04/28/2022   Procedure: COMPUTER ASSISTED TOTAL KNEE ARTHROPLASTY;  Surgeon: Dereck Leep, MD;  Location: [facility];  Service: Orthopedics;  Laterality: Right;   LOOP RECORDER INSERTION N/A 05/16/2020   Procedure: LOOP RECORDER INSERTION;  Surgeon: Isaias Cowman, MD;  Location: [place] [facility];  Service: Cardiovascular;  Laterality: N/A;   NASAL SINUS SURGERY  most recent 7-8 yrs ago   7 sinus surgeries    TRIGGER FINGER RELEASE  11/19/2011   Procedure: RELEASE TRIGGER FINGER/A-1 PULLEY;  Surgeon: Wynonia Sours, MD;  Location: [place];  Service: Orthopedics;  Laterality: Right;  release a-1 pulley right index finger and cyst removal   WRIST GANGLION EXCISION  1980's   right   [name] Active Problem List   Diagnosis Date Noted   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) 11/23/2022   AKI (acute kidney injury) ([place]) 11/23/2022   Abdominal pain 11/23/2022   Mesenteric mass 11/23/2022   Coffee ground emesis 11/23/2022   Nausea and vomiting 10/15/2022   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   Fatigue 09/17/2022   Left hemiparesis ([place]) 09/17/2022   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[place]) 08/22/2022   GERD (gastroesophageal reflux disease) 08/21/2022   Agitation 08/20/2022   Acute left-sided weakness 08/20/2022   Expressive aphasia    Stroke ([place]) 08/19/2022   Leukocytosis 08/19/2022   History of urticaria 04/28/2022   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   Primary osteoarthritis of left knee 02/24/2022   Primary osteoarthritis of right knee 02/24/2022    Lumbar spondylolysis 04/12/2020   History of CVA (cerebrovascular accident) 03/26/2020   Low back pain radiating to right lower extremity 03/21/2020   B12 deficiency 03/06/2020   Positive anti-CCP test 12/21/2019   Arthralgia 12/13/2019   Dermatitis 12/13/2019   Rheumatoid factor positive 12/13/2019   Essential hypertension 12/11/2018   Palpitations 12/11/2018   Acquired hypothyroidism 11/10/2018   Arthritis of knee 09/17/2016   Anxiety 11/22/2014   Asthma without status asthmaticus 11/22/2014   Benign neoplasm of colon, unspecified 11/22/2014   Environmental allergies 11/22/2014   Hypertriglyceridemia 11/22/2014   Hypokalemia 11/22/2014   Personal history of disease of skin and subcutaneous tissue 11/22/2014   REFERRING DIAG: CVA   THERAPY DIAG:  Muscle weakness (generalized)   Other lack of coordination   Rationale for Evaluation and Treatment Rehabilitation   SUBJECTIVE:   Pt.  reports  having spent some time out in the yard today.   SUBJECTIVE STATEMENT:   Pt accompanied by: significant other   PERTINENT HISTORY: [name] is an 83 year-old female who was admitted to [facility] on 11/23/2022 with a cerebellar CVA. 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 Pt. Was admitted to Inpatient Rehab from 12/21-1/02/2023. 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  [name] underwent inpatient rehabilitation for 2 weeks.  [name] was assessed and was scheduled for a knee replacement on 08/29/2022 however had to cancel it due to having had a CVA.  [name] had a recent fall 2 days after discharging from inpatient rehab. Past Medical History includes: Knee replacement, essential HTN, hypokalemia, leukocytosis, seizures, positive anti-- CCP test, anxiety disorder, mini strokes.  [name] had shingles with left eye nerve pain s/p 1 year ago.    PRECAUTIONS: Fall   WEIGHT BEARING RESTRICTIONS No   PAIN:  Are  you having pain?  No   FALLS: Has [name] fallen in last 6 months? Yes. Number of falls 1   LIVING ENVIRONMENT: Lives with: Lives with Spouse Lives in: House/apartment Stairs: 2 storey home, resides on the first floor.  External: 2 stairs front no rails, and 6 in back with rails Has following equipment at home: Single point cane, Walker - 2 wheeled, Environmental consultant - 4 wheeled, Shower bench, and bed side commode   PLOF: Independent   [name] GOALS  To Regain the use of her left arm   OBJECTIVE:    HAND DOMINANCE: Right   ADLs: Overall ADLs: Husband assists pt. as needed Transfers/ambulation related to ADLs:Pt. Uses a 3 wheeled walker with Husband assist. Eating: Pt. Is independent with the right hand. Pt. has difficulty cutting food. Grooming: Pt. is using her right hand, however has difficulty sustaining  her LUE in elevation to assist with haircare. UB Dressing: Pt. Is independent donning a pullover shirt, and button down shirt. Has difficulty with buttoning, LB Dressing:  Independent donning pants, and socks. Difficulty tying shoes. Toileting: Independent Bathing: Pt. Is able to engage her right hand. Tub Shower transfers: Supervision Equipment: See above for equipment     IADLs: Shopping:  Has not had the opportunity for grocery shopping yet Light housekeeping: Husband is assisting with light house keeping Meal Prep:  Dependent Community mobility: Relies of family/friends Medication management: Husband assisting with weekly pillbox set-up, and administering medication. Financial management: TBD Handwriting: 75% legible   MOBILITY STATUS: Hx of falls   POSTURE COMMENTS:  No Significant postural limitations Sitting balance: supported sitting balance WFL   ACTIVITY TOLERANCE: Activity tolerance:  Fatigues in greater than 30 min.    FUNCTIONAL OUTCOME MEASURES: FOTO: 57   UPPER EXTREMITY ROM      Active ROM Right Eval: WFL Left eval Left  01/22/23  Shoulder flexion    132 100  Shoulder abduction   80 85  Shoulder adduction       Shoulder extension       Shoulder internal rotation       Shoulder external rotation       Elbow flexion   140 140  Elbow extension   WNL WNL  Wrist flexion   65 68  Wrist extension   -10 20  Wrist ulnar deviation     12  Wrist radial deviation     8  Wrist pronation       Wrist supination       (Blank rows = not tested)   Left digit flexion to [facility]: 2nd: 0cm, 3rd: 0cm, 4th: 0cm, 5th: 0cm   Limited Left full 2nd digit extension     UPPER EXTREMITY MMT:      MMT Right Eval: 4+/5 overall Left Eval Left Eval  Shoulder flexion   3/5 3-/5  Shoulder abduction   3-/5 3-/5  Shoulder adduction       Shoulder extension       Shoulder internal rotation       Shoulder external rotation       Middle trapezius       Lower trapezius       Elbow flexion   3+/5 4/5  Elbow extension   3+/5 4/4  Wrist flexion       Wrist extension   2-/5 3-/5  Wrist ulnar deviation       Wrist radial deviation       Wrist pronation       Wrist supination       (Blank rows = not tested)   HAND FUNCTION: Grip strength: Right: 26#, Left: 10# Pinch strength: Right 8#, Left: 3#, 3 Pt. Pinch strength: Right: 9#, L: 2#  01/22/2023 Grip strength: Right: 26#, Left: 12# Pinch strength:  Pinch meter used at the initial eval has been sent out for recalibration     COORDINATION: Right: 22 sec., Left: <5 min. To place 7 pegs with increased compensation proximally in the trunk, and through reflexive associated reactions.  01/22/23 Right: 22 sec., Left: 3 min. & 4 sec.   SENSATION: Light touch: WFL, proprioceptive awareness: Intact   EDEMA: N/A   MUSCLE TONE: LUE: Hypotonic   COGNITION: Overall cognitive status: WFL for tasks assessed. Pt. Is impulsive at times.   VISION: Subjective report: Pt. report having shingles affecting left eye  s/p 1 year.  Has nerve pain Baseline vision: Wears glasses for reading only Visual history:  updated  see clinical impression   VISION ASSESSMENT:    WFL for tasks performed   PERCEPTION: Intact   PRAXIS: Impaired: Motor planning   OBSERVATIONS:  Pt. More alert, and engaging since prior to the most recent hospitalization.     TODAY'S TREATMENT:   There. Ex:  Pt. worked on [employer], and reciprocal motion using the UBE while seated for 8 min. with no resistance. Constant monitoring was provided. Pt. tolerated AAROM throughout the LUE, and hand. Pt. worked on challenging digit flexion, followed by gross digit extension with thumb abduction using 1.5# DigiFlex, yellow, and red resistive clips. Pt. worked on alternating reps of digit extension following resistance.   Pt. continues to tolerate Left UE AAROM stretches prior to [facility] tasks. Pt. tolerated the UBE well today while seated. Pt. was able to reach higher with less support required proximally at the elbow. Pt. was able to actively isolate the 2nd digit in extension consistently. Pt. presented with improved 2nd digit extension when challenged with more resistance, and flexor tightness. Pt. requires cues for proprioception, and weightbearing through her left hand. Pt. continues to work on improving left shoulder elbow and wrist strength, improving motor control and coordination in order to prepare the left upper extremity and hand for functional reaching, and engaging the left upper extremity when performing hair care, buttoning, securely holding flowers, cutting food, and manipulating/handling/sorting utensils from a drawer for setting a table, as well as adaptive and compensatory strategies during daily ADL and IADL care.     [name] EDUCATION: Education details: left hand digit extension Person educated: [name] and Spouse Education method: Customer service manager Education comprehension: verbalized understanding, returned demonstration, and needs further education     HOME EXERCISE PROGRAM:    Reviewed activities at  home to promote isolated 2nd digit extension.       GOALS: Goals reviewed with [name]? Yes   SHORT TERM GOALS: Target date: 01/27/2023       1. [name] will be independent with home exercise program for the left upper extremity Baseline: 02/01/2023: Pt. Consistently attempts to perform HEPs independently. No current home exercise program Goal status: Ongoing     LONG TERM GOALS: Target date: 03/10/2023     [name] will improve left shoulder strength by 2 mm grades to be able to sustain UE's in elevation long enough to wash her hair.  Baseline: 01/22/2023: Left shoulder flexion: 3-/5, abduction: 3-/5 Eval: Left shoulder flexion: 3/5, abduction: 3-/5 Goal status: Ongoing   2.  [name] will improve left shoulder active abduction to be able to comb her hair Baseline: 01/22/2023: Shoulder abduction: 85 Eval: Left shoulder abduction is 80(108) Goal status: Ongoing   3.  [name] will independently button her shirt with modified independence. Baseline: 01/22/2023: Pt. continues to have difficulty. Eval: [name] has difficulty.  Goal status: Ongoing   4.  [name] well improve left grip strength in preparation  for securely holding flowers. Baseline: 01/22/2023: Left: 12# Eval: Pt. Is unable to securely hold flowers. Goal status: Ongoing   5.  Pt. will independently recall adaptive  strategies for performing ADL tasks including: flossing teeth, donning bra, applying makeup. Baseline: 01/22/2023: Pt. continues to benefit from education about adaptive strategies during ADLs, and IADLs. Eval: Pt. to be provided with adaptive strategies. Goal status: Ongoing   6.  Pt. will improve Foto score by 2 points to reflect [name] perceived performance improvement assessment specific ADLs  and IADLs FOTO score:67 Eval: 57 Goal status: Ongoing  7.  Pt. will improve left hand coordination skills in order to be able to handle, and sort utensils in a drawer.     Baseline: 01/22/2022: Left: 3 min. & 4 sec.  Eval: Pt. has difficulty sorting, and placing utensils with the left hand. Left FMC : >5 min. For 7 pegs on the 9 hole peg test.    Goal Status:  Ongoing  8. Pt. will improve active left 2nd digit extension to be able able to isolate her 2nd digit in preparation for pressing/pushing buttons on appliances, phones, or remotes. Baseline: 01/22/2023: s able to perform full digit extension, although 2nd digit is slow to extend s 2/2 flexor tone. Pt.  Eval: Pt. is able to is unable to actively perform full digit extension Goal status:  Ongoing   ASSESSMENT:   CLINICAL IMPRESSION:  Pt. continues to tolerate Left UE AAROM stretches prior to [facility] tasks. Pt. tolerated the UBE well today while seated. Pt. was able to reach higher with less support required proximally at the elbow. Pt. was able to actively isolate the 2nd digit in extension consistently. Pt. presented with improved 2nd digit extension when challenged with more resistance, and flexor tightness. Pt. requires cues for proprioception, and weightbearing through her left hand. Pt. continues to work on improving left shoulder elbow and wrist strength, improving motor control and coordination in order to prepare the left upper extremity and hand for functional reaching, and engaging the left upper extremity when performing hair care, buttoning, securely holding flowers, cutting food, and manipulating/handling/sorting utensils from a drawer for setting a table, as well as adaptive and compensatory strategies during daily ADL and IADL care.  PERFORMANCE DEFICITS in functional skills including ADLs, IADLs, coordination, proprioception, ROM, strength, [place], and GMC, cognitive skills including memory, and psychosocial skills including coping strategies, environmental adaptation, interpersonal interactions, and routines and behaviors.    IMPAIRMENTS are limiting [name] from ADLs, IADLs, education, leisure, and social participation.    COMORBIDITIES may have  co-morbidities  that affects occupational performance. [name] will benefit from skilled OT to address above impairments and improve overall function.   MODIFICATION OR ASSISTANCE TO COMPLETE EVALUATION: Min-Moderate modification of tasks or assist with assess necessary to complete an evaluation.   OT OCCUPATIONAL PROFILE AND HISTORY: Detailed assessment: Review of records and additional review of physical, cognitive, psychosocial history related to current functional performance.   CLINICAL DECISION MAKING: Moderate - several treatment options, min-mod task modification necessary   REHAB POTENTIAL: Good   EVALUATION COMPLEXITY: Moderate      PLAN: OT FREQUENCY: 2x/week   OT DURATION: 12 weeks   PLANNED INTERVENTIONS: self care/ADL training, therapeutic exercise, therapeutic activity, neuromuscular re-education, manual therapy, passive range of motion, functional mobility training, electrical stimulation, and paraffin   RECOMMENDED OTHER SERVICES: PT   CONSULTED AND AGREED WITH PLAN OF CARE: [name] and family member/caregiver   PLAN FOR NEXT SESSION: Initiate OT treatment  Harrel Carina, MS, OTR/L   Harrel Carina, OT 02/19/2023, 5:34 PM

## 2023-02-24 ENCOUNTER — Encounter: Payer: Medicare HMO | Admitting: Speech Pathology

## 2023-02-24 ENCOUNTER — Ambulatory Visit: Payer: Medicare HMO | Admitting: Occupational Therapy

## 2023-02-24 ENCOUNTER — Ambulatory Visit: Payer: Medicare HMO

## 2023-02-24 DIAGNOSIS — M6281 Muscle weakness (generalized): Secondary | ICD-10-CM

## 2023-02-24 DIAGNOSIS — R278 Other lack of coordination: Secondary | ICD-10-CM | POA: Diagnosis not present

## 2023-02-24 NOTE — Therapy (Signed)
Occupational Therapy Treatment Note  [name] Name: [name] MRN: [id number] DOB:07/21/1941, 82 y.o., female Today's Date: 02/24/2023  PCP: Dr. Doy Hutching REFERRING PROVIDER:  Dr. Doy Hutching  END OF SESSION:  OT End of Session - 02/24/23 1656     Visit Number 18    Number of Visits 24    Date for OT Re-Evaluation 03/10/23    Authorization Time Period Progress report period starting 12/16/2022    OT Start Time 1515    OT Stop Time 1600    OT Time Calculation (min) 45 min    Activity Tolerance [name] limited by fatigue    Behavior During Therapy [facility] for tasks assessed/performed               Past Medical History:  Diagnosis Date   Anemia    Arthritis    Asthma    uses inhaler just prior to surgery to avoid attack   Back pain    from previous injury   Complication of anesthesia    has woken  up during 2 different surgery   Depression    no current issue/treatment; situation   Gallstones    GERD (gastroesophageal reflux disease)    Hiatal hernia    [name] does NOT have nerve/muscle disease   History of kidney stones    HLD (hyperlipidemia)    HTN (hypertension)    Hypothyroidism    Kidney stones    Knee pain    Nausea and vomiting 10/15/2022   Non-diabetic pancreatic hormone dysfunction years   pt. 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   Pancreatitis    Pneumonia    Seizures (HCC)    caused by dye injected during a procedure   Shortness of breath    with exertion   Sinus problem    frequent infections/congestion   Stroke ([place]) 2021   reports having CVA in 2021 and having mini strokes before that   Thyroid disease    Past Surgical History:  Procedure Laterality Date   ABDOMINAL HYSTERECTOMY     APPENDECTOMY     CARPAL TUNNEL RELEASE  10+ years ago   bilateral   EYE SURGERY  3 yrs ago   bilateral cataracts   FOOT OSTEOTOMY  6 weeks ago   Left foot: great, 2nd & 3rd   FOOT OSTEOTOMY  5 years ago   Right great toe   HAND SURGERY Bilateral  2011-most recent   multiple hand surgeries, 2 on left, 3 on right   KNEE ARTHROPLASTY Right 04/28/2022   Procedure: COMPUTER ASSISTED TOTAL KNEE ARTHROPLASTY;  Surgeon: Dereck Leep, MD;  Location: [facility];  Service: Orthopedics;  Laterality: Right;   LOOP RECORDER INSERTION N/A 05/16/2020   Procedure: LOOP RECORDER INSERTION;  Surgeon: Isaias Cowman, MD;  Location: [place] [facility];  Service: Cardiovascular;  Laterality: N/A;   NASAL SINUS SURGERY  most recent 7-8 yrs ago   7 sinus surgeries    TRIGGER FINGER RELEASE  11/19/2011   Procedure: RELEASE TRIGGER FINGER/A-1 PULLEY;  Surgeon: Wynonia Sours, MD;  Location: [place];  Service: Orthopedics;  Laterality: Right;  release a-1 pulley right index finger and cyst removal   WRIST GANGLION EXCISION  1980's   right   [name] Active Problem List   Diagnosis Date Noted   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 11/27/2022   Hypomagnesemia 11/27/2022   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   HLD (hyperlipidemia) 11/23/2022   Asthma 11/23/2022   Depression  with anxiety 11/23/2022   Chronic diastolic CHF (congestive heart failure) ([place]) 11/23/2022   Normocytic anemia 11/23/2022   Aspiration pneumonia ([place]) 11/23/2022   AKI (acute kidney injury) ([place]) 11/23/2022   Abdominal pain 11/23/2022   Mesenteric mass 11/23/2022   Coffee ground emesis 11/23/2022   Nausea and vomiting 10/15/2022   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   Fatigue 09/17/2022   Left hemiparesis ([place]) 09/17/2022   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[place]) 08/22/2022   GERD (gastroesophageal reflux disease) 08/21/2022   Agitation 08/20/2022   Acute left-sided weakness 08/20/2022   Expressive aphasia    Stroke ([place]) 08/19/2022   Leukocytosis 08/19/2022   History of urticaria 04/28/2022   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   Primary osteoarthritis of left knee 02/24/2022   Primary osteoarthritis of right knee 02/24/2022    Lumbar spondylolysis 04/12/2020   History of CVA (cerebrovascular accident) 03/26/2020   Low back pain radiating to right lower extremity 03/21/2020   B12 deficiency 03/06/2020   Positive anti-CCP test 12/21/2019   Arthralgia 12/13/2019   Dermatitis 12/13/2019   Rheumatoid factor positive 12/13/2019   Essential hypertension 12/11/2018   Palpitations 12/11/2018   Acquired hypothyroidism 11/10/2018   Arthritis of knee 09/17/2016   Anxiety 11/22/2014   Asthma without status asthmaticus 11/22/2014   Benign neoplasm of colon, unspecified 11/22/2014   Environmental allergies 11/22/2014   Hypertriglyceridemia 11/22/2014   Hypokalemia 11/22/2014   Personal history of disease of skin and subcutaneous tissue 11/22/2014   REFERRING DIAG: CVA   THERAPY DIAG:  Muscle weakness (generalized)   Other lack of coordination   Rationale for Evaluation and Treatment Rehabilitation   SUBJECTIVE:   Pt. Reports having been out for ice cream this afternoon.   SUBJECTIVE STATEMENT:   Pt accompanied by: significant other   PERTINENT HISTORY: [name] is an 68 year-old female who was admitted to [facility] on 11/23/2022 with a cerebellar CVA. 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 Pt. Was admitted to Inpatient Rehab from 12/21-1/02/2023. 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  [name] underwent inpatient rehabilitation for 2 weeks.  [name] was assessed and was scheduled for a knee replacement on 08/29/2022 however had to cancel it due to having had a CVA.  [name] had a recent fall 2 days after discharging from inpatient rehab. Past Medical History includes: Knee replacement, essential HTN, hypokalemia, leukocytosis, seizures, positive anti-- CCP test, anxiety disorder, mini strokes.  [name] had shingles with left eye nerve pain s/p 1 year ago.    PRECAUTIONS: Fall   WEIGHT BEARING RESTRICTIONS No   PAIN:  Are  you having pain?  No   FALLS: Has [name] fallen in last 6 months? Yes. Number of falls 1   LIVING ENVIRONMENT: Lives with: Lives with Spouse Lives in: House/apartment Stairs: 2 storey home, resides on the first floor.  External: 2 stairs front no rails, and 6 in back with rails Has following equipment at home: Single point cane, Walker - 2 wheeled, Environmental consultant - 4 wheeled, Shower bench, and bed side commode   PLOF: Independent   [name] GOALS  To Regain the use of her left arm   OBJECTIVE:    HAND DOMINANCE: Right   ADLs: Overall ADLs: Husband assists pt. as needed Transfers/ambulation related to ADLs:Pt. Uses a 3 wheeled walker with Husband assist. Eating: Pt. Is independent with the right hand. Pt. has difficulty cutting food. Grooming: Pt. is using her right hand, however has difficulty sustaining her LUE in  elevation to assist with haircare. UB Dressing: Pt. Is independent donning a pullover shirt, and button down shirt. Has difficulty with buttoning, LB Dressing:  Independent donning pants, and socks. Difficulty tying shoes. Toileting: Independent Bathing: Pt. Is able to engage her right hand. Tub Shower transfers: Supervision Equipment: See above for equipment     IADLs: Shopping:  Has not had the opportunity for grocery shopping yet Light housekeeping: Husband is assisting with light house keeping Meal Prep:  Dependent Community mobility: Relies of family/friends Medication management: Husband assisting with weekly pillbox set-up, and administering medication. Financial management: TBD Handwriting: 75% legible   MOBILITY STATUS: Hx of falls   POSTURE COMMENTS:  No Significant postural limitations Sitting balance: supported sitting balance WFL   ACTIVITY TOLERANCE: Activity tolerance:  Fatigues in greater than 30 min.    FUNCTIONAL OUTCOME MEASURES: FOTO: 57   UPPER EXTREMITY ROM      Active ROM Right Eval: WFL Left eval Left  01/22/23  Shoulder flexion    132 100  Shoulder abduction   80 85  Shoulder adduction       Shoulder extension       Shoulder internal rotation       Shoulder external rotation       Elbow flexion   140 140  Elbow extension   WNL WNL  Wrist flexion   65 68  Wrist extension   -10 20  Wrist ulnar deviation     12  Wrist radial deviation     8  Wrist pronation       Wrist supination       (Blank rows = not tested)   Left digit flexion to [facility]: 2nd: 0cm, 3rd: 0cm, 4th: 0cm, 5th: 0cm   Limited Left full 2nd digit extension     UPPER EXTREMITY MMT:      MMT Right Eval: 4+/5 overall Left Eval Left Eval  Shoulder flexion   3/5 3-/5  Shoulder abduction   3-/5 3-/5  Shoulder adduction       Shoulder extension       Shoulder internal rotation       Shoulder external rotation       Middle trapezius       Lower trapezius       Elbow flexion   3+/5 4/5  Elbow extension   3+/5 4/4  Wrist flexion       Wrist extension   2-/5 3-/5  Wrist ulnar deviation       Wrist radial deviation       Wrist pronation       Wrist supination       (Blank rows = not tested)   HAND FUNCTION: Grip strength: Right: 26#, Left: 10# Pinch strength: Right 8#, Left: 3#, 3 Pt. Pinch strength: Right: 9#, L: 2#  01/22/2023 Grip strength: Right: 26#, Left: 12# Pinch strength:  Pinch meter used at the initial eval has been sent out for recalibration     COORDINATION: Right: 22 sec., Left: <5 min. To place 7 pegs with increased compensation proximally in the trunk, and through reflexive associated reactions.  01/22/23 Right: 22 sec., Left: 3 min. & 4 sec.   SENSATION: Light touch: WFL, proprioceptive awareness: Intact   EDEMA: N/A   MUSCLE TONE: LUE: Hypotonic   COGNITION: Overall cognitive status: WFL for tasks assessed. Pt. Is impulsive at times.   VISION: Subjective report: Pt. report having shingles affecting left eye  s/p 1 year. Has nerve pain  Baseline vision: Wears glasses for reading only Visual history:  updated  see clinical impression   VISION ASSESSMENT:    WFL for tasks performed   PERCEPTION: Intact   PRAXIS: Impaired: Motor planning   OBSERVATIONS:  Pt. more alert, and engaging since prior to the most recent hospitalization.     TODAY'S TREATMENT:    There. Ex:  Pt. worked on [employer], and reciprocal motion using the UBE while seated for 8 min. with no resistance. Constant monitoring was provided. Pt. tolerated AAROM throughout the LUE, and hand. Pt. worked on challenging digit flexion using a gripper with 2 rubber band resistance followed by gross digit extension with thumb abduction, and thumb IP extension.   Neuromuscular re-education:   Pt. worked on grasping coins from an elevated tabletop surface, placing them into a resistive container, and pushing them through the slot while isolating his 2nd digit. Pt. worked on moving the coins to the edge of a flat surface isolating her 2nd digit to the thumb to form the grasp in preparation for placing them into a resistive container.  Manual Therapy:  Pt. tolerated soft tissue massage to the scapular musculature, and scapular mobilizations in elevation depression, abduction/rotation.  Pt. continues to tolerate Left UE AAROM stretches prior to [facility] tasks. Pt. tolerated the UBE well today while seated. Pt. was able to tolerate the hand gripper for strengthening well today. Pt. presents with thumb IP flexion affecting the thumb, and 2nd digit when grasping. Pt. required cues to follow up reps of proprioception through the left hand to encourage 2nd digit extension, and thumb IP extension. Pt. was able to actively isolate the 2nd digit in extension consistently. Pt. requires cues for proprioception, and weightbearing through her left hand. Pt. continues to work on improving left shoulder elbow and wrist strength, improving motor control and coordination in order to prepare the left upper extremity and hand for functional reaching, and  engaging the left upper extremity when performing hair care, buttoning, securely holding flowers, cutting food, and manipulating/handling/sorting utensils from a drawer for setting a table, as well as adaptive and compensatory strategies during daily ADL and IADL care.     [name] EDUCATION: Education details: left hand digit extension Person educated: [name] and Spouse Education method: Customer service manager Education comprehension: verbalized understanding, returned demonstration, and needs further education     HOME EXERCISE PROGRAM:    Reviewed activities at home to promote isolated 2nd digit extension.       GOALS: Goals reviewed with [name]? Yes   SHORT TERM GOALS: Target date: 01/27/2023       1. [name] will be independent with home exercise program for the left upper extremity Baseline: 02/01/2023: Pt. Consistently attempts to perform HEPs independently. No current home exercise program Goal status: Ongoing     LONG TERM GOALS: Target date: 03/10/2023     [name] will improve left shoulder strength by 2 mm grades to be able to sustain UE's in elevation long enough to wash her hair.  Baseline: 01/22/2023: Left shoulder flexion: 3-/5, abduction: 3-/5 Eval: Left shoulder flexion: 3/5, abduction: 3-/5 Goal status: Ongoing   2.  [name] will improve left shoulder active abduction to be able to comb her hair Baseline: 01/22/2023: Shoulder abduction: 85 Eval: Left shoulder abduction is 80(108) Goal status: Ongoing   3.  [name] will independently button her shirt with modified independence. Baseline: 01/22/2023: Pt. continues to have difficulty. Eval: [name] has difficulty.  Goal status: Ongoing   4.  [name]  well improve left grip strength in preparation  for securely holding flowers. Baseline: 01/22/2023: Left: 12# Eval: Pt. Is unable to securely hold flowers. Goal status: Ongoing   5.  Pt. will independently recall adaptive  strategies for performing ADL tasks  including: flossing teeth, donning bra, applying makeup. Baseline: 01/22/2023: Pt. continues to benefit from education about adaptive strategies during ADLs, and IADLs. Eval: Pt. to be provided with adaptive strategies. Goal status: Ongoing   6.  Pt. will improve Foto score by 2 points to reflect [name] perceived performance improvement assessment specific ADLs  and IADLs FOTO score:67 Eval: 57 Goal status: Ongoing  7.  Pt. will improve left hand coordination skills in order to be able to handle, and sort utensils in a drawer.     Baseline: 01/22/2022: Left: 3 min. & 4 sec. Eval: Pt. has difficulty sorting, and placing utensils with the left hand. Left FMC : >5 min. For 7 pegs on the 9 hole peg test.    Goal Status:  Ongoing  8. Pt. will improve active left 2nd digit extension to be able able to isolate her 2nd digit in preparation for pressing/pushing buttons on appliances, phones, or remotes. Baseline: 01/22/2023: s able to perform full digit extension, although 2nd digit is slow to extend s 2/2 flexor tone. Pt.  Eval: Pt. is able to is unable to actively perform full digit extension Goal status:  Ongoing   ASSESSMENT:   CLINICAL IMPRESSION:  Pt. continues to tolerate Left UE AAROM stretches prior to [facility] tasks. Pt. tolerated the UBE well today while seated. Pt. was able to tolerate the hand gripper for strengthening well today. Pt. presents with thumb IP flexion affecting the thumb, and 2nd digit when grasping. Pt. required cues to follow up reps of proprioception through the left hand to encourage 2nd digit extension, and thumb IP extension. Pt. was able to actively isolate the 2nd digit in extension consistently. Pt. requires cues for proprioception, and weightbearing through her left hand. Pt. continues to work on improving left shoulder elbow and wrist strength, improving motor control and coordination in order to prepare the left upper extremity and hand for functional reaching, and engaging  the left upper extremity when performing hair care, buttoning, securely holding flowers, cutting food, and manipulating/handling/sorting utensils from a drawer for setting a table, as well as adaptive and compensatory strategies during daily ADL and IADL care.  PERFORMANCE DEFICITS in functional skills including ADLs, IADLs, coordination, proprioception, ROM, strength, [place], and GMC, cognitive skills including memory, and psychosocial skills including coping strategies, environmental adaptation, interpersonal interactions, and routines and behaviors.    IMPAIRMENTS are limiting [name] from ADLs, IADLs, education, leisure, and social participation.    COMORBIDITIES may have co-morbidities  that affects occupational performance. [name] will benefit from skilled OT to address above impairments and improve overall function.   MODIFICATION OR ASSISTANCE TO COMPLETE EVALUATION: Min-Moderate modification of tasks or assist with assess necessary to complete an evaluation.   OT OCCUPATIONAL PROFILE AND HISTORY: Detailed assessment: Review of records and additional review of physical, cognitive, psychosocial history related to current functional performance.   CLINICAL DECISION MAKING: Moderate - several treatment options, min-mod task modification necessary   REHAB POTENTIAL: Good   EVALUATION COMPLEXITY: Moderate      PLAN: OT FREQUENCY: 2x/week   OT DURATION: 12 weeks   PLANNED INTERVENTIONS: self care/ADL training, therapeutic exercise, therapeutic activity, neuromuscular re-education, manual therapy, passive range of motion, functional mobility training, electrical stimulation, and paraffin  RECOMMENDED OTHER SERVICES: PT   CONSULTED AND AGREED WITH PLAN OF CARE: [name] and family member/caregiver   PLAN FOR NEXT SESSION: Initiate OT treatment   Harrel Carina, MS, OTR/L   Harrel Carina, OT 02/24/2023, 5:00 PM

## 2023-02-26 ENCOUNTER — Ambulatory Visit: Payer: Medicare HMO | Admitting: Occupational Therapy

## 2023-02-26 ENCOUNTER — Ambulatory Visit: Payer: Medicare HMO

## 2023-02-26 ENCOUNTER — Encounter: Payer: Medicare HMO | Admitting: Speech Pathology

## 2023-02-26 DIAGNOSIS — R278 Other lack of coordination: Secondary | ICD-10-CM

## 2023-02-26 DIAGNOSIS — M6281 Muscle weakness (generalized): Secondary | ICD-10-CM | POA: Diagnosis not present

## 2023-02-26 NOTE — Therapy (Signed)
Occupational Therapy Treatment Note  Patient Name: ANEVAEH Castillo MRN: [id number] DOB:05/09/1941, 82 y.o., female Today's Date: 02/26/2023  PCP: Dr. Doy Hutching REFERRING PROVIDER:  Dr. Doy Hutching  END OF SESSION:  OT End of Session - 02/26/23 2221     Visit Number 19    Number of Visits 24    Date for OT Re-Evaluation 03/10/23    Authorization Time Period Progress report period starting 12/16/2022    OT Start Time 1350    OT Stop Time 1430    OT Time Calculation (min) 40 min    Activity Tolerance Patient limited by fatigue    Behavior During Therapy [facility] for tasks assessed/performed               Past Medical History:  Diagnosis Date   Anemia    Arthritis    Asthma    uses inhaler just prior to surgery to avoid attack   Back pain    from previous injury   Complication of anesthesia    has woken  up during 2 different surgery   Depression    no current issue/treatment; situation   Gallstones    GERD (gastroesophageal reflux disease)    Hiatal hernia    patient does NOT have nerve/muscle disease   History of kidney stones    HLD (hyperlipidemia)    HTN (hypertension)    Hypothyroidism    Kidney stones    Knee pain    Nausea and vomiting 10/15/2022   Non-diabetic pancreatic hormone dysfunction years   pt. 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   Pancreatitis    Pneumonia    Seizures (HCC)    caused by dye injected during a procedure   Shortness of breath    with exertion   Sinus problem    frequent infections/congestion   Stroke ([place]) 2021   reports having CVA in 2021 and having mini strokes before that   Thyroid disease    Past Surgical History:  Procedure Laterality Date   ABDOMINAL HYSTERECTOMY     APPENDECTOMY     CARPAL TUNNEL RELEASE  10+ years ago   bilateral   EYE SURGERY  3 yrs ago   bilateral cataracts   FOOT OSTEOTOMY  6 weeks ago   Left foot: great, 2nd & 3rd   FOOT OSTEOTOMY  5 years ago   Right great toe   HAND SURGERY Bilateral  2011-most recent   multiple hand surgeries, 2 on left, 3 on right   KNEE ARTHROPLASTY Right 04/28/2022   Procedure: COMPUTER ASSISTED TOTAL KNEE ARTHROPLASTY;  Surgeon: Dereck Leep, MD;  Location: [facility] OR[facility];  Service: Orthopedics;  Laterality: Right;   LOOP RECORDER INSERTION N/A 05/16/2020   Procedure: LOOP RECORDER INSERTION;  Surgeon: Isaias Cowman, MD;  Location: [place] [facility];  Service: Cardiovascular;  Laterality: N/A;   NASAL SINUS SURGERY  most recent 7-8 yrs ago   7 sinus surgeries    TRIGGER FINGER RELEASE  11/19/2011   Procedure: RELEASE TRIGGER FINGER/A-1 PULLEY;  Surgeon: Wynonia Sours, MD;  Location: [facility];  Service: Orthopedics;  Laterality: Right;  release a-1 pulley right index finger and cyst removal   WRIST GANGLION EXCISION  1980's   right   Patient Active Problem List   Diagnosis Date Noted   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 11/27/2022   Hypomagnesemia 11/27/2022   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   HLD (hyperlipidemia) 11/23/2022   Asthma 11/23/2022   Depression  with anxiety 11/23/2022   Chronic diastolic CHF (congestive heart failure) ([place]) 11/23/2022   Normocytic anemia 11/23/2022   Aspiration pneumonia ([place]) 11/23/2022   AKI (acute kidney injury) ([place]) 11/23/2022   Abdominal pain 11/23/2022   Mesenteric mass 11/23/2022   Coffee ground emesis 11/23/2022   Nausea and vomiting 10/15/2022   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   Fatigue 09/17/2022   Left hemiparesis ([place]) 09/17/2022   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[place]) 08/22/2022   GERD (gastroesophageal reflux disease) 08/21/2022   Agitation 08/20/2022   Acute left-sided weakness 08/20/2022   Expressive aphasia    Stroke ([place]) 08/19/2022   Leukocytosis 08/19/2022   History of urticaria 04/28/2022   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   Primary osteoarthritis of left knee 02/24/2022   Primary osteoarthritis of right knee 02/24/2022    Lumbar spondylolysis 04/12/2020   History of CVA (cerebrovascular accident) 03/26/2020   Low back pain radiating to right lower extremity 03/21/2020   B12 deficiency 03/06/2020   Positive anti-CCP test 12/21/2019   Arthralgia 12/13/2019   Dermatitis 12/13/2019   Rheumatoid factor positive 12/13/2019   Essential hypertension 12/11/2018   Palpitations 12/11/2018   Acquired hypothyroidism 11/10/2018   Arthritis of knee 09/17/2016   Anxiety 11/22/2014   Asthma without status asthmaticus 11/22/2014   Benign neoplasm of colon, unspecified 11/22/2014   Environmental allergies 11/22/2014   Hypertriglyceridemia 11/22/2014   Hypokalemia 11/22/2014   Personal history of disease of skin and subcutaneous tissue 11/22/2014   REFERRING DIAG: CVA   THERAPY DIAG:  Muscle weakness (generalized)   Other lack of coordination   Rationale for Evaluation and Treatment Rehabilitation   SUBJECTIVE:   Pt. Reports having been out for ice cream this afternoon.   SUBJECTIVE STATEMENT:   Pt accompanied by: significant other   PERTINENT HISTORY: Patient is an 82 year-old female who was admitted to [facility] on 11/23/2022 with a cerebellar CVA. 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 Pt. Was admitted to Inpatient Rehab from 12/21-1/02/2023. 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  Patient underwent inpatient rehabilitation for 2 weeks.  Patient was assessed and was scheduled for a knee replacement on 08/29/2022 however had to cancel it due to having had a CVA.  Patient had a recent fall 2 days after discharging from inpatient rehab. Past Medical History includes: Knee replacement, essential HTN, hypokalemia, leukocytosis, seizures, positive anti-- CCP test, anxiety disorder, mini strokes.  Patient had shingles with left eye nerve pain s/p 1 year ago.    PRECAUTIONS: Fall   WEIGHT BEARING RESTRICTIONS No   PAIN:  Are  you having pain?  No   FALLS: Has patient fallen in last 6 months? Yes. Number of falls 1   LIVING ENVIRONMENT: Lives with: Lives with Spouse Lives in: House/apartment Stairs: 2 storey home, resides on the first floor.  External: 2 stairs front no rails, and 6 in back with rails Has following equipment at home: Single point cane, Walker - 2 wheeled, Environmental consultant - 4 wheeled, Shower bench, and bed side commode   PLOF: Independent   PATIENT GOALS  To Regain the use of her left arm   OBJECTIVE:    HAND DOMINANCE: Right   ADLs: Overall ADLs: Husband assists pt. as needed Transfers/ambulation related to ADLs:Pt. Uses a 3 wheeled walker with Husband assist. Eating: Pt. Is independent with the right hand. Pt. has difficulty cutting food. Grooming: Pt. is using her right hand, however has difficulty sustaining her LUE in  elevation to assist with haircare. UB Dressing: Pt. Is independent donning a pullover shirt, and button down shirt. Has difficulty with buttoning, LB Dressing:  Independent donning pants, and socks. Difficulty tying shoes. Toileting: Independent Bathing: Pt. Is able to engage her right hand. Tub Shower transfers: Supervision Equipment: See above for equipment     IADLs: Shopping:  Has not had the opportunity for grocery shopping yet Light housekeeping: Husband is assisting with light house keeping Meal Prep:  Dependent Community mobility: Relies of family/friends Medication management: Husband assisting with weekly pillbox set-up, and administering medication. Financial management: TBD Handwriting: 75% legible   MOBILITY STATUS: Hx of falls   POSTURE COMMENTS:  No Significant postural limitations Sitting balance: supported sitting balance WFL   ACTIVITY TOLERANCE: Activity tolerance:  Fatigues in greater than 30 min.    FUNCTIONAL OUTCOME MEASURES: FOTO: 57   UPPER EXTREMITY ROM      Active ROM Right Eval: WFL Left eval Left  01/22/23  Shoulder flexion    132 100  Shoulder abduction   80 85  Shoulder adduction       Shoulder extension       Shoulder internal rotation       Shoulder external rotation       Elbow flexion   140 140  Elbow extension   WNL WNL  Wrist flexion   65 68  Wrist extension   -10 20  Wrist ulnar deviation     12  Wrist radial deviation     8  Wrist pronation       Wrist supination       (Blank rows = not tested)   Left digit flexion to [facility]: 2nd: 0cm, 3rd: 0cm, 4th: 0cm, 5th: 0cm   Limited Left full 2nd digit extension     UPPER EXTREMITY MMT:      MMT Right Eval: 4+/5 overall Left Eval Left Eval  Shoulder flexion   3/5 3-/5  Shoulder abduction   3-/5 3-/5  Shoulder adduction       Shoulder extension       Shoulder internal rotation       Shoulder external rotation       Middle trapezius       Lower trapezius       Elbow flexion   3+/5 4/5  Elbow extension   3+/5 4/4  Wrist flexion       Wrist extension   2-/5 3-/5  Wrist ulnar deviation       Wrist radial deviation       Wrist pronation       Wrist supination       (Blank rows = not tested)   HAND FUNCTION: Grip strength: Right: 26#, Left: 10# Pinch strength: Right 8#, Left: 3#, 3 Pt. Pinch strength: Right: 9#, L: 2#  01/22/2023 Grip strength: Right: 26#, Left: 12# Pinch strength:  Pinch meter used at the initial eval has been sent out for recalibration     COORDINATION: Right: 22 sec., Left: <5 min. To place 7 pegs with increased compensation proximally in the trunk, and through reflexive associated reactions.  01/22/23 Right: 22 sec., Left: 3 min. & 4 sec.   SENSATION: Light touch: WFL, proprioceptive awareness: Intact   EDEMA: N/A   MUSCLE TONE: LUE: Hypotonic   COGNITION: Overall cognitive status: WFL for tasks assessed. Pt. Is impulsive at times.   VISION: Subjective report: Pt. report having shingles affecting left eye  s/p 1 year. Has nerve pain  Baseline vision: Wears glasses for reading only Visual history:  updated  see clinical impression   VISION ASSESSMENT:    WFL for tasks performed   PERCEPTION: Intact   PRAXIS: Impaired: Motor planning   OBSERVATIONS:  Pt. more alert, and engaging since prior to the most recent hospitalization.     TODAY'S TREATMENT:    There. Ex:  Pt. worked on [employer], and reciprocal motion using the UBE while seated for 8 min. with no resistance. Constant monitoring was provided. Pt. tolerated AAROM throughout the LUE, and hand. Pt. Worked on functional reaching for the [place] rings.  Pt. Worked on moving the rings through 3 horizontal rungs of progressively increasing height.  Neuromuscular re-education:   Pt. worked on grasping 1" resistive cubes alternating thumb opposition to the tip of the 2nd digit while the board is placed at a vertical angle. Pt. worked on pressing the cubes back into place while alternating isolated 2nd through 5th digit extension.   Pt. continues to tolerate Left UE AAROM stretches prior to [facility] tasks. Pt. tolerated the UBE well today while seated. Pt. presents with thumb IP flexion affecting the thumb, and 2nd digit when grasping, however is able to independently extend the 2nd digit following each rep. Pt. required cues to follow up reps of proprioception through the left hand to encourage 2nd digit extension, and thumb IP extension. Pt. was able to actively isolate the 2nd digit in extension consistently. Pt. required assist proximally at the elbow when reaching. Pt. requires cues for proprioception, and weightbearing through her left hand. Pt. continues to work on improving left shoulder elbow and wrist strength, improving motor control and coordination in order to prepare the left upper extremity and hand for functional reaching, and engaging the left upper extremity when performing hair care, buttoning, securely holding flowers, cutting food, and manipulating/handling/sorting utensils from a drawer for setting a table, as well as adaptive  and compensatory strategies during daily ADL and IADL care.     PATIENT EDUCATION: Education details: left hand digit extension Person educated: Patient and Spouse Education method: Customer service manager Education comprehension: verbalized understanding, returned demonstration, and needs further education     HOME EXERCISE PROGRAM:    Reviewed activities at home to promote isolated 2nd digit extension.       GOALS: Goals reviewed with patient? Yes   SHORT TERM GOALS: Target date: 01/27/2023       1. Patient will be independent with home exercise program for the left upper extremity Baseline: 02/01/2023: Pt. Consistently attempts to perform HEPs independently. No current home exercise program Goal status: Ongoing     LONG TERM GOALS: Target date: 03/10/2023     Patient will improve left shoulder strength by 2 mm grades to be able to sustain UE's in elevation long enough to wash her hair.  Baseline: 01/22/2023: Left shoulder flexion: 3-/5, abduction: 3-/5 Eval: Left shoulder flexion: 3/5, abduction: 3-/5 Goal status: Ongoing   2.  Patient will improve left shoulder active abduction to be able to comb her hair Baseline: 01/22/2023: Shoulder abduction: 85 Eval: Left shoulder abduction is 80(108) Goal status: Ongoing   3.  Patient will independently button her shirt with modified independence. Baseline: 01/22/2023: Pt. continues to have difficulty. Eval: Patient has difficulty.  Goal status: Ongoing   4.  Patient well improve left grip strength in preparation  for securely holding flowers. Baseline: 01/22/2023: Left: 12# Eval: Pt. Is unable to securely hold flowers. Goal status: Ongoing   5.  Pt. will independently recall adaptive  strategies for performing ADL tasks including: flossing teeth, donning bra, applying makeup. Baseline: 01/22/2023: Pt. continues to benefit from education about adaptive strategies during ADLs, and IADLs. Eval: Pt. to be provided with adaptive  strategies. Goal status: Ongoing   6.  Pt. will improve Foto score by 2 points to reflect patient perceived performance improvement assessment specific ADLs  and IADLs FOTO score:67 Eval: 57 Goal status: Ongoing  7.  Pt. will improve left hand coordination skills in order to be able to handle, and sort utensils in a drawer.     Baseline: 01/22/2022: Left: 3 min. & 4 sec. Eval: Pt. has difficulty sorting, and placing utensils with the left hand. Left FMC : >5 min. For 7 pegs on the 9 hole peg test.    Goal Status:  Ongoing  8. Pt. will improve active left 2nd digit extension to be able able to isolate her 2nd digit in preparation for pressing/pushing buttons on appliances, phones, or remotes. Baseline: 01/22/2023: s able to perform full digit extension, although 2nd digit is slow to extend s 2/2 flexor tone. Pt.  Eval: Pt. is able to is unable to actively perform full digit extension Goal status:  Ongoing   ASSESSMENT:   CLINICAL IMPRESSION:  Pt. continues to tolerate Left UE A/AAROM stretches prior to [facility] tasks. Pt. tolerated the UBE well today while seated. Pt. presents with thumb IP flexion affecting the thumb, and 2nd digit when grasping, however is able to independently extend the 2nd digit following each rep. Pt. Require cues to follow up reps of proprioception through the left hand to encourage 2nd digit extension, and thumb IP extension. Pt. was able to actively isolate the 2nd digit in extension consistently. Pt. required support proximally at the elbow when reaching. Pt. requires cues for proprioception, and weightbearing through her left hand. Pt. continues to work on improving left shoulder elbow and wrist strength, improving motor control and coordination in order to prepare the left upper extremity and hand for functional reaching, and engaging the left upper extremity when performing hair care, buttoning, securely holding flowers, cutting food, and manipulating/handling/sorting  utensils from a drawer for setting a table, as well as adaptive and compensatory strategies during daily ADL and IADL care.  PERFORMANCE DEFICITS in functional skills including ADLs, IADLs, coordination, proprioception, ROM, strength, [place], and GMC, cognitive skills including memory, and psychosocial skills including coping strategies, environmental adaptation, interpersonal interactions, and routines and behaviors.    IMPAIRMENTS are limiting patient from ADLs, IADLs, education, leisure, and social participation.    COMORBIDITIES may have co-morbidities  that affects occupational performance. Patient will benefit from skilled OT to address above impairments and improve overall function.   MODIFICATION OR ASSISTANCE TO COMPLETE EVALUATION: Min-Moderate modification of tasks or assist with assess necessary to complete an evaluation.   OT OCCUPATIONAL PROFILE AND HISTORY: Detailed assessment: Review of records and additional review of physical, cognitive, psychosocial history related to current functional performance.   CLINICAL DECISION MAKING: Moderate - several treatment options, min-mod task modification necessary   REHAB POTENTIAL: Good   EVALUATION COMPLEXITY: Moderate      PLAN: OT FREQUENCY: 2x/week   OT DURATION: 12 weeks   PLANNED INTERVENTIONS: self care/ADL training, therapeutic exercise, therapeutic activity, neuromuscular re-education, manual therapy, passive range of motion, functional mobility training, electrical stimulation, and paraffin   RECOMMENDED OTHER SERVICES: PT   CONSULTED AND AGREED WITH PLAN OF CARE: Patient and family member/caregiver   PLAN FOR NEXT  SESSION: Initiate OT treatment   Harrel Carina, MS, OTR/L   Harrel Carina, OT 02/26/2023, 10:26 PM

## 2023-03-03 ENCOUNTER — Ambulatory Visit: Payer: Medicare HMO | Admitting: Occupational Therapy

## 2023-03-03 ENCOUNTER — Telehealth: Payer: Self-pay | Admitting: Occupational Therapy

## 2023-03-03 ENCOUNTER — Encounter: Payer: Medicare HMO | Admitting: Occupational Therapy

## 2023-03-03 DIAGNOSIS — M6281 Muscle weakness (generalized): Secondary | ICD-10-CM

## 2023-03-03 DIAGNOSIS — R278 Other lack of coordination: Secondary | ICD-10-CM | POA: Diagnosis not present

## 2023-03-03 NOTE — Telephone Encounter (Signed)
Pt. did not arrive for her 4:00 OT appointment this afternoon. An attempt was made to contact the Pt., and a message was left via voicemail with the return number to our clinic.

## 2023-03-03 NOTE — Therapy (Signed)
Occupational Therapy Progress/Recertification Note  Dates of reporting period  01/22/2023   to   03/03/2023   Patient Name: [name] MRN: [id number] DOB:18-Jun-1941, 82 y.o., female Today's Date: 03/03/2023  PCP: Dr. Doy Hutching REFERRING PROVIDER:  Dr. Doy Hutching  END OF SESSION:  OT End of Session - 03/03/23 1653     Visit Number 20    Number of Visits 63    Date for OT Re-Evaluation 05/26/23    OT Start Time 1647    OT Stop Time 1730    OT Time Calculation (min) 43 min    Activity Tolerance Patient limited by fatigue    Behavior During Therapy [facility] for tasks assessed/performed               Past Medical History:  Diagnosis Date   Anemia    Arthritis    Asthma    uses inhaler just prior to surgery to avoid attack   Back pain    from previous injury   Complication of anesthesia    has woken  up during 2 different surgery   Depression    no current issue/treatment; situation   Gallstones    GERD (gastroesophageal reflux disease)    Hiatal hernia    patient does NOT have nerve/muscle disease   History of kidney stones    HLD (hyperlipidemia)    HTN (hypertension)    Hypothyroidism    Kidney stones    Knee pain    Nausea and vomiting 10/15/2022   Non-diabetic pancreatic hormone dysfunction years   pt. 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   Pancreatitis    Pneumonia    Seizures (HCC)    caused by dye injected during a procedure   Shortness of breath    with exertion   Sinus problem    frequent infections/congestion   Stroke ([place]) 2021   reports having CVA in 2021 and having mini strokes before that   Thyroid disease    Past Surgical History:  Procedure Laterality Date   ABDOMINAL HYSTERECTOMY     APPENDECTOMY     CARPAL TUNNEL RELEASE  10+ years ago   bilateral   EYE SURGERY  3 yrs ago   bilateral cataracts   FOOT OSTEOTOMY  6 weeks ago   Left foot: great, 2nd & 3rd   FOOT OSTEOTOMY  5 years ago   Right great toe   HAND SURGERY Bilateral  2011-most recent   multiple hand surgeries, 2 on left, 3 on right   KNEE ARTHROPLASTY Right 04/28/2022   Procedure: COMPUTER ASSISTED TOTAL KNEE ARTHROPLASTY;  Surgeon: Dereck Leep, MD;  Location: [facility];  Service: Orthopedics;  Laterality: Right;   LOOP RECORDER INSERTION N/A 05/16/2020   Procedure: LOOP RECORDER INSERTION;  Surgeon: Isaias Cowman, MD;  Location: [place] [facility];  Service: Cardiovascular;  Laterality: N/A;   NASAL SINUS SURGERY  most recent 7-8 yrs ago   7 sinus surgeries    TRIGGER FINGER RELEASE  11/19/2011   Procedure: RELEASE TRIGGER FINGER/A-1 PULLEY;  Surgeon: Wynonia Sours, MD;  Location: [facility];  Service: Orthopedics;  Laterality: Right;  release a-1 pulley right index finger and cyst removal   WRIST GANGLION EXCISION  1980's   right   Patient Active Problem List   Diagnosis Date Noted   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 11/27/2022   Hypomagnesemia 11/27/2022   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   HLD (hyperlipidemia) 11/23/2022   Asthma 11/23/2022  Depression with anxiety 11/23/2022   Chronic diastolic CHF (congestive heart failure) ([place]) 11/23/2022   Normocytic anemia 11/23/2022   Aspiration pneumonia ([place]) 11/23/2022   AKI (acute kidney injury) ([place]) 11/23/2022   Abdominal pain 11/23/2022   Mesenteric mass 11/23/2022   Coffee ground emesis 11/23/2022   Nausea and vomiting 10/15/2022   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   Fatigue 09/17/2022   Left hemiparesis ([place]) 09/17/2022   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[place]) 08/22/2022   GERD (gastroesophageal reflux disease) 08/21/2022   Agitation 08/20/2022   Acute left-sided weakness 08/20/2022   Expressive aphasia    Stroke ([place]) 08/19/2022   Leukocytosis 08/19/2022   History of urticaria 04/28/2022   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   Primary osteoarthritis of left knee 02/24/2022   Primary osteoarthritis of right knee 02/24/2022    Lumbar spondylolysis 04/12/2020   History of CVA (cerebrovascular accident) 03/26/2020   Low back pain radiating to right lower extremity 03/21/2020   B12 deficiency 03/06/2020   Positive anti-CCP test 12/21/2019   Arthralgia 12/13/2019   Dermatitis 12/13/2019   Rheumatoid factor positive 12/13/2019   Essential hypertension 12/11/2018   Palpitations 12/11/2018   Acquired hypothyroidism 11/10/2018   Arthritis of knee 09/17/2016   Anxiety 11/22/2014   Asthma without status asthmaticus 11/22/2014   Benign neoplasm of colon, unspecified 11/22/2014   Environmental allergies 11/22/2014   Hypertriglyceridemia 11/22/2014   Hypokalemia 11/22/2014   Personal history of disease of skin and subcutaneous tissue 11/22/2014   REFERRING DIAG: CVA   THERAPY DIAG:  Muscle weakness (generalized)   Other lack of coordination   Rationale for Evaluation and Treatment Rehabilitation   SUBJECTIVE:   Pt. Was late for the appointment 2/2 confusion with the schedule.   SUBJECTIVE STATEMENT:   Pt accompanied by: significant other   PERTINENT HISTORY: Patient is an 82 year-old female who was admitted to [facility] on 11/23/2022 with a cerebellar CVA. 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 Pt. Was admitted to Inpatient Rehab from 12/21-1/02/2023. 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  Patient underwent inpatient rehabilitation for 2 weeks.  Patient was assessed and was scheduled for a knee replacement on 08/29/2022 however had to cancel it due to having had a CVA.  Patient had a recent fall 2 days after discharging from inpatient rehab. Past Medical History includes: Knee replacement, essential HTN, hypokalemia, leukocytosis, seizures, positive anti-- CCP test, anxiety disorder, mini strokes.  Patient had shingles with left eye nerve pain s/p 1 year ago.    PRECAUTIONS: Fall   WEIGHT BEARING RESTRICTIONS No   PAIN:   Are you having pain?  Left arm soreness form a fall.   FALLS: Has patient fallen in last 6 months? Yes. Number of falls 1   LIVING ENVIRONMENT: Lives with: Lives with Spouse Lives in: House/apartment Stairs: 2 storey home, resides on the first floor.  External: 2 stairs front no rails, and 6 in back with rails Has following equipment at home: Single point cane, Walker - 2 wheeled, Environmental consultant - 4 wheeled, Shower bench, and bed side commode   PLOF: Independent   PATIENT GOALS  To Regain the use of her left arm   OBJECTIVE:    HAND DOMINANCE: Right   ADLs: Overall ADLs: Husband assists pt. as needed Transfers/ambulation related to ADLs:Pt. Uses a 3 wheeled walker with Husband assist. Eating: Pt. Is independent with the right hand. Pt. has difficulty cutting food. Grooming: Pt. is using her right hand,  however has difficulty sustaining her LUE in elevation to assist with haircare. UB Dressing: Pt. Is independent donning a pullover shirt, and button down shirt. Has difficulty with buttoning, LB Dressing:  Independent donning pants, and socks. Difficulty tying shoes. Toileting: Independent Bathing: Pt. Is able to engage her right hand. Tub Shower transfers: Supervision Equipment: See above for equipment     IADLs: Shopping:  Has not had the opportunity for grocery shopping yet Light housekeeping: Husband is assisting with light house keeping Meal Prep:  Dependent Community mobility: Relies of family/friends Medication management: Husband assisting with weekly pillbox set-up, and administering medication. Financial management: TBD Handwriting: 75% legible   MOBILITY STATUS: Hx of falls   POSTURE COMMENTS:  No Significant postural limitations Sitting balance: supported sitting balance WFL   ACTIVITY TOLERANCE: Activity tolerance:  Fatigues in greater than 30 min.    FUNCTIONAL OUTCOME MEASURES: FOTO: 57   UPPER EXTREMITY ROM      Active ROM Right Eval: WFL Left eval Left   01/22/23 Left  03/03/23  Shoulder flexion   132 100 108  Shoulder abduction   80 85 85  Shoulder adduction        Shoulder extension        Shoulder internal rotation        Shoulder external rotation        Elbow flexion   140 140 WFL  Elbow extension   WNL WNL WFL  Wrist flexion   65 68   Wrist extension   -10 20 24   Wrist ulnar deviation     12 10  Wrist radial deviation     8 14  Wrist pronation        Wrist supination        (Blank rows = not tested)   Left digit flexion to [facility]: 2nd: 0cm, 3rd: 0cm, 4th: 0cm, 5th: 0cm   Limited Left full 2nd digit extension     UPPER EXTREMITY MMT:      MMT Right Eval: 4+/5 overall Left Eval Left 01/22/23 Left 03/03/23  Shoulder flexion   3/5 3-/5 3-/5  Shoulder abduction   3-/5 3-/5 3-/5  Shoulder adduction        Shoulder extension        Shoulder internal rotation        Shoulder external rotation        Middle trapezius        Lower trapezius        Elbow flexion   3+/5 4/5 N/A  Elbow extension   3+/5 4/4 N/A  Wrist flexion        Wrist extension   2-/5 3-/5 3-/5  Wrist ulnar deviation        Wrist radial deviation        Wrist pronation        Wrist supination        (Blank rows = not tested)   HAND FUNCTION: Grip strength: Right: 26#, Left: 10# Pinch strength: Right 8#, Left: 3#, 3 Pt. Pinch strength: Right: 9#, L: 2#  01/22/2023 Grip strength: Right: 26#, Left: 12# Pinch strength:  Pinch meter used at the initial eval has been sent out for recalibration   03/03/2023 Grip strength: Right: 26#, Left: 13# Pinch strength:  Pinch meter used at the initial eval has been sent out for recalibration     COORDINATION: Right: 22 sec., Left: <5 min. To place 7 pegs with increased compensation proximally in the trunk, and through  reflexive associated reactions.  01/22/23 Right: 22 sec., Left: 3 min. & 4 sec.  03/03/23 Right: 22 sec., Left: 1 min. & 39 sec.   SENSATIO   SENSATION: Light touch: WFL, proprioceptive  awareness: Intact   EDEMA: N/A   MUSCLE TONE: LUE: Hypotonic   COGNITION: Overall cognitive status: WFL for tasks assessed. Pt. Is impulsive at times.   VISION: Subjective report: Pt. report having shingles affecting left eye  s/p 1 year. Has nerve pain Baseline vision: Wears glasses for reading only Visual history:  updated see clinical impression   VISION ASSESSMENT:    WFL for tasks performed   PERCEPTION: Intact   PRAXIS: Impaired: Motor planning   OBSERVATIONS:  Pt. more alert, and engaging since prior to the most recent hospitalization.     TODAY'S TREATMENT:   Measurements were obtained, and goals were reviewed with the Pt.      PATIENT EDUCATION: Education details: left hand digit extension Person educated: Patient and Spouse Education method: Customer service manager Education comprehension: verbalized understanding, returned demonstration, and needs further education     HOME EXERCISE PROGRAM:    Reviewed activities at home to promote isolated 2nd digit extension.       GOALS: Goals reviewed with patient? Yes   SHORT TERM GOALS: Target date: 01/27/2023       1. Patient will be independent with home exercise program for the left upper extremity Baseline: 02/01/2023: Pt. Consistently attempts to perform HEPs independently. No current home exercise program Goal status: Ongoing     LONG TERM GOALS: Target date: 03/10/2023     Patient will improve left shoulder strength by 2 mm grades to be able to sustain UE's in elevation long enough to wash her hair.  Baseline: 03/03/2023: Left shoulder flexion: 3-/5, abduction: 3-/5 01/22/2023: Left shoulder flexion: 3-/5, abduction: 3-/5 Eval: Left shoulder flexion: 3/5, abduction: 3-/5 Goal status: Ongoing   2.  Patient will improve left shoulder active abduction to be able to comb her hair Baseline: 03/03/2023: Shoulder abduction: 85 01/22/2023: Shoulder abduction: 85 Eval: Left shoulder abduction is 80(108) Goal  status: Ongoing   3.  Patient will independently button her shirt with modified independence. Baseline: 03/03/2023: Pt. Continues to have difficulty with buttoning. 01/22/2023: Pt. continues to have difficulty. Eval: Patient has difficulty.  Goal status: Ongoing   4.  Patient well improve left grip strength in preparation  for securely holding flowers. Baseline:03/03/2023: left grip strength: 13# 01/22/2023: Left: 12# Eval: Pt. Is unable to securely hold flowers. Goal status: Ongoing   5.  Pt. will independently recall adaptive  strategies for performing ADL tasks including: flossing teeth, donning bra, applying makeup. Baseline: 03/03/2023: Continue 01/22/2023: Pt. continues to benefit from education about adaptive strategies during ADLs, and IADLs. Eval: Pt. to be provided with adaptive strategies. Goal status: Ongoing   6.  Pt. will improve Foto score by 2 points to reflect patient perceived performance improvement assessment specific ADLs  and IADLs Baseline: 03/03/23: FOTO 62 Eval: 57 Goal status: Ongoing  7.  Pt. will improve left hand coordination skills in order to be able to handle, and sort utensils in a drawer.     Baseline: 03/03/23: 1 min. & 39 01/22/2022: Left: 3 min. & 4 sec. Eval: Pt. has difficulty sorting, and placing utensils with the left hand. Left FMC : >5 min. For 7 pegs on the 9 hole peg test.    Goal Status:  Ongoing  8. Pt. will improve active left 2nd digit extension  to be able able to isolate her 2nd digit in preparation for pressing/pushing buttons on appliances, phones, or remotes. Baseline: 03/03/23: Pt. Continues to work on improving consistency with 2nd digit extension to press the remote. 01/22/2023: is able to perform full digit extension, although 2nd digit is slow to extend s 2/2 flexor tone. Pt.  Eval: Pt. is able to is unable to actively perform full digit extension Goal status:  Ongoing   ASSESSMENT:   CLINICAL IMPRESSION:  Pt. reports having a fall after  bumping into her husband. Pt. Reports bruising her left elbow. Pt. has made progress over this progress reporting period, and has improved with left shoulder ROM, grip strength, as well as [facility] skills, speed, and dexterity. Pt.'s FOTO score has improved to 62. Pt. is now able to initiate using her left hand more to open door knobs, and uses it in the shower. Pt. continues to work on improving left shoulder elbow and wrist strength, improving motor control and coordination in order to prepare the left upper extremity and hand for functional reaching, and engaging the left upper extremity when performing hair care, buttoning, securely holding flowers, cutting food, and manipulating/handling/sorting utensils from a drawer for setting a table, as well as adaptive and compensatory strategies during daily ADL and IADL care.  PERFORMANCE DEFICITS in functional skills including ADLs, IADLs, coordination, proprioception, ROM, strength, [place], and GMC, cognitive skills including memory, and psychosocial skills including coping strategies, environmental adaptation, interpersonal interactions, and routines and behaviors.    IMPAIRMENTS are limiting patient from ADLs, IADLs, education, leisure, and social participation.    COMORBIDITIES may have co-morbidities  that affects occupational performance. Patient will benefit from skilled OT to address above impairments and improve overall function.   MODIFICATION OR ASSISTANCE TO COMPLETE EVALUATION: Min-Moderate modification of tasks or assist with assess necessary to complete an evaluation.   OT OCCUPATIONAL PROFILE AND HISTORY: Detailed assessment: Review of records and additional review of physical, cognitive, psychosocial history related to current functional performance.   CLINICAL DECISION MAKING: Moderate - several treatment options, min-mod task modification necessary   REHAB POTENTIAL: Good   EVALUATION COMPLEXITY: Moderate      PLAN: OT FREQUENCY:  2x/week   OT DURATION: 12 weeks   PLANNED INTERVENTIONS: self care/ADL training, therapeutic exercise, therapeutic activity, neuromuscular re-education, manual therapy, passive range of motion, functional mobility training, electrical stimulation, and paraffin   RECOMMENDED OTHER SERVICES: PT   CONSULTED AND AGREED WITH PLAN OF CARE: Patient and family member/caregiver   PLAN FOR NEXT SESSION: Initiate OT treatment   Harrel Carina, MS, OTR/L   Harrel Carina, OT 03/03/2023, 4:57 PM

## 2023-03-05 ENCOUNTER — Ambulatory Visit: Payer: Medicare HMO | Admitting: Occupational Therapy

## 2023-03-05 DIAGNOSIS — R278 Other lack of coordination: Secondary | ICD-10-CM | POA: Diagnosis not present

## 2023-03-05 DIAGNOSIS — M6281 Muscle weakness (generalized): Secondary | ICD-10-CM | POA: Diagnosis not present

## 2023-03-05 NOTE — Therapy (Signed)
Occupational Therapy Treatment Note  Patient Name: [name] MRN: [id number] DOB:04-16-1941, 82 y.o., female Today's Date: 03/05/2023  PCP: Dr. Doy Hutching REFERRING PROVIDER:  Dr. Doy Hutching  END OF SESSION:  OT End of Session - 03/05/23 1740     Visit Number 21    Number of Visits 34    Date for OT Re-Evaluation 05/26/23    OT Start Time 1647    OT Stop Time 1730    OT Time Calculation (min) 43 min    Activity Tolerance Patient limited by fatigue    Behavior During Therapy [facility] for tasks assessed/performed               Past Medical History:  Diagnosis Date   Anemia    Arthritis    Asthma    uses inhaler just prior to surgery to avoid attack   Back pain    from previous injury   Complication of anesthesia    has woken  up during 2 different surgery   Depression    no current issue/treatment; situation   Gallstones    GERD (gastroesophageal reflux disease)    Hiatal hernia    patient does NOT have nerve/muscle disease   History of kidney stones    HLD (hyperlipidemia)    HTN (hypertension)    Hypothyroidism    Kidney stones    Knee pain    Nausea and vomiting 10/15/2022   Non-diabetic pancreatic hormone dysfunction years   pt. 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   Pancreatitis    Pneumonia    Seizures (HCC)    caused by dye injected during a procedure   Shortness of breath    with exertion   Sinus problem    frequent infections/congestion   Stroke ([place]) 2021   reports having CVA in 2021 and having mini strokes before that   Thyroid disease    Past Surgical History:  Procedure Laterality Date   ABDOMINAL HYSTERECTOMY     APPENDECTOMY     CARPAL TUNNEL RELEASE  10+ years ago   bilateral   EYE SURGERY  3 yrs ago   bilateral cataracts   FOOT OSTEOTOMY  6 weeks ago   Left foot: great, 2nd & 3rd   FOOT OSTEOTOMY  5 years ago   Right great toe   HAND SURGERY Bilateral 2011-most recent   multiple hand surgeries, 2 on left, 3 on right   KNEE  ARTHROPLASTY Right 04/28/2022   Procedure: COMPUTER ASSISTED TOTAL KNEE ARTHROPLASTY;  Surgeon: Dereck Leep, MD;  Location: [facility];  Service: Orthopedics;  Laterality: Right;   LOOP RECORDER INSERTION N/A 05/16/2020   Procedure: LOOP RECORDER INSERTION;  Surgeon: Isaias Cowman, MD;  Location: [place] [facility];  Service: Cardiovascular;  Laterality: N/A;   NASAL SINUS SURGERY  most recent 7-8 yrs ago   7 sinus surgeries    TRIGGER FINGER RELEASE  11/19/2011   Procedure: RELEASE TRIGGER FINGER/A-1 PULLEY;  Surgeon: Wynonia Sours, MD;  Location: [place];  Service: Orthopedics;  Laterality: Right;  release a-1 pulley right index finger and cyst removal   WRIST GANGLION EXCISION  1980's   right   Patient Active Problem List   Diagnosis Date Noted   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 11/27/2022   Hypomagnesemia 11/27/2022   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   HLD (hyperlipidemia) 11/23/2022   Asthma 11/23/2022   Depression with anxiety 11/23/2022   Chronic diastolic CHF (congestive heart failure) (  [place]) 11/23/2022   Normocytic anemia 11/23/2022   Aspiration pneumonia ([place]) 11/23/2022   AKI (acute kidney injury) ([place]) 11/23/2022   Abdominal pain 11/23/2022   Mesenteric mass 11/23/2022   Coffee ground emesis 11/23/2022   Nausea and vomiting 10/15/2022   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   Fatigue 09/17/2022   Left hemiparesis ([place]) 09/17/2022   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[place]) 08/22/2022   GERD (gastroesophageal reflux disease) 08/21/2022   Agitation 08/20/2022   Acute left-sided weakness 08/20/2022   Expressive aphasia    Stroke ([place]) 08/19/2022   Leukocytosis 08/19/2022   History of urticaria 04/28/2022   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   Primary osteoarthritis of left knee 02/24/2022   Primary osteoarthritis of right knee 02/24/2022   Lumbar spondylolysis 04/12/2020   History of CVA (cerebrovascular accident)  03/26/2020   Low back pain radiating to right lower extremity 03/21/2020   B12 deficiency 03/06/2020   Positive anti-CCP test 12/21/2019   Arthralgia 12/13/2019   Dermatitis 12/13/2019   Rheumatoid factor positive 12/13/2019   Essential hypertension 12/11/2018   Palpitations 12/11/2018   Acquired hypothyroidism 11/10/2018   Arthritis of knee 09/17/2016   Anxiety 11/22/2014   Asthma without status asthmaticus 11/22/2014   Benign neoplasm of colon, unspecified 11/22/2014   Environmental allergies 11/22/2014   Hypertriglyceridemia 11/22/2014   Hypokalemia 11/22/2014   Personal history of disease of skin and subcutaneous tissue 11/22/2014   REFERRING DIAG: CVA   THERAPY DIAG:  Muscle weakness (generalized)   Other lack of coordination   Rationale for Evaluation and Treatment Rehabilitation   SUBJECTIVE:   Pt. Reports they went to [employer] to purchase an antique chair today.   SUBJECTIVE STATEMENT:   Pt accompanied by: significant other   PERTINENT HISTORY: Patient is an 82 year-old female who was admitted to [facility] on 11/23/2022 with a cerebellar CVA. 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 Pt. Was admitted to Inpatient Rehab from 12/21-1/02/2023. 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  Patient underwent inpatient rehabilitation for 2 weeks.  Patient was assessed and was scheduled for a knee replacement on 08/29/2022 however had to cancel it due to having had a CVA.  Patient had a recent fall 2 days after discharging from inpatient rehab. Past Medical History includes: Knee replacement, essential HTN, hypokalemia, leukocytosis, seizures, positive anti-- CCP test, anxiety disorder, mini strokes.  Patient had shingles with left eye nerve pain s/p 1 year ago.    PRECAUTIONS: Fall   WEIGHT BEARING RESTRICTIONS No   PAIN:  Are you having pain?  No   FALLS: Has patient fallen in last 6  months? Yes. Number of falls 1   LIVING ENVIRONMENT: Lives with: Lives with Spouse Lives in: House/apartment Stairs: 2 storey home, resides on the first floor.  External: 2 stairs front no rails, and 6 in back with rails Has following equipment at home: Single point cane, Walker - 2 wheeled, Environmental consultant - 4 wheeled, Shower bench, and bed side commode   PLOF: Independent   PATIENT GOALS  To Regain the use of her left arm   OBJECTIVE:    HAND DOMINANCE: Right   ADLs: Overall ADLs: Husband assists pt. as needed Transfers/ambulation related to ADLs:Pt. Uses a 3 wheeled walker with Husband assist. Eating: Pt. Is independent with the right hand. Pt. has difficulty cutting food. Grooming: Pt. is using her right hand, however has difficulty sustaining her LUE in elevation to assist with haircare. UB Dressing: Pt.  Is independent donning a pullover shirt, and button down shirt. Has difficulty with buttoning, LB Dressing:  Independent donning pants, and socks. Difficulty tying shoes. Toileting: Independent Bathing: Pt. Is able to engage her right hand. Tub Shower transfers: Supervision Equipment: See above for equipment     IADLs: Shopping:  Has not had the opportunity for grocery shopping yet Light housekeeping: Husband is assisting with light house keeping Meal Prep:  Dependent Community mobility: Relies of family/friends Medication management: Husband assisting with weekly pillbox set-up, and administering medication. Financial management: TBD Handwriting: 75% legible   MOBILITY STATUS: Hx of falls   POSTURE COMMENTS:  No Significant postural limitations Sitting balance: supported sitting balance WFL   ACTIVITY TOLERANCE: Activity tolerance:  Fatigues in greater than 30 min.    FUNCTIONAL OUTCOME MEASURES: FOTO: 57   UPPER EXTREMITY ROM      Active ROM Right Eval: WFL Left eval Left  01/22/23 Left  03/03/23  Shoulder flexion   132 100 108  Shoulder abduction   80 85 85   Shoulder adduction        Shoulder extension        Shoulder internal rotation        Shoulder external rotation        Elbow flexion   140 140 WFL  Elbow extension   WNL WNL WFL  Wrist flexion   65 68   Wrist extension   -10 20 24   Wrist ulnar deviation     12 10  Wrist radial deviation     8 14  Wrist pronation        Wrist supination        (Blank rows = not tested)   Left digit flexion to [facility]: 2nd: 0cm, 3rd: 0cm, 4th: 0cm, 5th: 0cm   Limited Left full 2nd digit extension     UPPER EXTREMITY MMT:      MMT Right Eval: 4+/5 overall Left Eval Left 01/22/23 Left 03/03/23  Shoulder flexion   3/5 3-/5 3-/5  Shoulder abduction   3-/5 3-/5 3-/5  Shoulder adduction        Shoulder extension        Shoulder internal rotation        Shoulder external rotation        Middle trapezius        Lower trapezius        Elbow flexion   3+/5 4/5 N/A  Elbow extension   3+/5 4/4 N/A  Wrist flexion        Wrist extension   2-/5 3-/5 3-/5  Wrist ulnar deviation        Wrist radial deviation        Wrist pronation        Wrist supination        (Blank rows = not tested)   HAND FUNCTION: Grip strength: Right: 26#, Left: 10# Pinch strength: Right 8#, Left: 3#, 3 Pt. Pinch strength: Right: 9#, L: 2#  01/22/2023 Grip strength: Right: 26#, Left: 12# Pinch strength:  Pinch meter used at the initial eval has been sent out for recalibration   03/03/2023 Grip strength: Right: 26#, Left: 13# Pinch strength:  Pinch meter used at the initial eval has been sent out for recalibration     COORDINATION: Right: 22 sec., Left: <5 min. To place 7 pegs with increased compensation proximally in the trunk, and through reflexive associated reactions.  01/22/23 Right: 22 sec., Left: 3 min. & 4 sec.  03/03/23 Right: 22 sec., Left: 1 min. & 39 sec.   SENSATIO   SENSATION: Light touch: WFL, proprioceptive awareness: Intact   EDEMA: N/A   MUSCLE TONE: LUE: Hypotonic   COGNITION: Overall  cognitive status: WFL for tasks assessed. Pt. Is impulsive at times.   VISION: Subjective report: Pt. report having shingles affecting left eye  s/p 1 year. Has nerve pain Baseline vision: Wears glasses for reading only Visual history:  updated see clinical impression   VISION ASSESSMENT:    WFL for tasks performed   PERCEPTION: Intact   PRAXIS: Impaired: Motor planning   OBSERVATIONS:  Pt. more alert, and engaging since prior to the most recent hospitalization.     TODAY'S TREATMENT:   Therapeutic Ex:  Pt. worked on [employer], and reciprocal motion using the UBE while seated for 8 min. with no resistance. Constant monitoring was provided.Pt. worked on reaching with the LUE using then vertical shape tower. Pt. worked on extending the left 2nd digit PIP when grasping the flat shapes, and moving them through 2 vertical rungs.  Neuromuscular re-education:  Pt. worked on focusing on extending the left 2nd digit PIP when preparing to slide various sizes of buttons off the edge of an elevated flat surface to there thumb prior to placing them into a container.      PATIENT EDUCATION: Education details: left hand digit extension Person educated: Patient and Spouse Education method: Customer service manager Education comprehension: verbalized understanding, returned demonstration, and needs further education     HOME EXERCISE PROGRAM:    Reviewed activities at home to promote isolated 2nd digit extension.       GOALS: Goals reviewed with patient? Yes   SHORT TERM GOALS: Target date: 01/27/2023       1. Patient will be independent with home exercise program for the left upper extremity Baseline: 02/01/2023: Pt. Consistently attempts to perform HEPs independently. No current home exercise program Goal status: Ongoing     LONG TERM GOALS: Target date: 03/10/2023     Patient will improve left shoulder strength by 2 mm grades to be able to sustain UE's in elevation  long enough to wash her hair.  Baseline: 03/03/2023: Left shoulder flexion: 3-/5, abduction: 3-/5 01/22/2023: Left shoulder flexion: 3-/5, abduction: 3-/5 Eval: Left shoulder flexion: 3/5, abduction: 3-/5 Goal status: Ongoing   2.  Patient will improve left shoulder active abduction to be able to comb her hair Baseline: 03/03/2023: Shoulder abduction: 85 01/22/2023: Shoulder abduction: 85 Eval: Left shoulder abduction is 80(108) Goal status: Ongoing   3.  Patient will independently button her shirt with modified independence. Baseline: 03/03/2023: Pt. Continues to have difficulty with buttoning. 01/22/2023: Pt. continues to have difficulty. Eval: Patient has difficulty.  Goal status: Ongoing   4.  Patient well improve left grip strength in preparation  for securely holding flowers. Baseline:03/03/2023: left grip strength: 13# 01/22/2023: Left: 12# Eval: Pt. Is unable to securely hold flowers. Goal status: Ongoing   5.  Pt. will independently recall adaptive  strategies for performing ADL tasks including: flossing teeth, donning bra, applying makeup. Baseline: 03/03/2023: Continue 01/22/2023: Pt. continues to benefit from education about adaptive strategies during ADLs, and IADLs. Eval: Pt. to be provided with adaptive strategies. Goal status: Ongoing   6.  Pt. will improve Foto score by 2 points to reflect patient perceived performance improvement assessment specific ADLs  and IADLs Baseline: 03/03/23: FOTO 62 Eval: 57 Goal status: Ongoing  7.  Pt. will improve left hand coordination  skills in order to be able to handle, and sort utensils in a drawer.     Baseline: 03/03/23: 1 min. & 39 01/22/2022: Left: 3 min. & 4 sec. Eval: Pt. has difficulty sorting, and placing utensils with the left hand. Left FMC : >5 min. For 7 pegs on the 9 hole peg test.    Goal Status:  Ongoing  8. Pt. will improve active left 2nd digit extension to be able able to isolate her 2nd digit in preparation for pressing/pushing  buttons on appliances, phones, or remotes. Baseline: 03/03/23: Pt. Continues to work on improving consistency with 2nd digit extension to press the remote. 01/22/2023: is able to perform full digit extension, although 2nd digit is slow to extend s 2/2 flexor tone. Pt.  Eval: Pt. is able to is unable to actively perform full digit extension Goal status:  Ongoing   ASSESSMENT:   CLINICAL IMPRESSION:  Pt. reports that her left arm feels much better today, and the bruising is better, however is still there following her fall. Pt. Tolerated the exercises well. Pt. required increased cues today for extending 2nd digit PIP, and alternating proprioception to decrease tone, and prepare her digits for extension. Pt. continues to work on improving left shoulder elbow and wrist strength, improving motor control and coordination in order to prepare the left upper extremity and hand for functional reaching, and engaging the left upper extremity when performing hair care, buttoning, securely holding flowers, cutting food, and manipulating/handling/sorting utensils from a drawer for setting a table, as well as adaptive and compensatory strategies during daily ADL and IADL care.  PERFORMANCE DEFICITS in functional skills including ADLs, IADLs, coordination, proprioception, ROM, strength, [place], and GMC, cognitive skills including memory, and psychosocial skills including coping strategies, environmental adaptation, interpersonal interactions, and routines and behaviors.    IMPAIRMENTS are limiting patient from ADLs, IADLs, education, leisure, and social participation.    COMORBIDITIES may have co-morbidities  that affects occupational performance. Patient will benefit from skilled OT to address above impairments and improve overall function.   MODIFICATION OR ASSISTANCE TO COMPLETE EVALUATION: Min-Moderate modification of tasks or assist with assess necessary to complete an evaluation.   OT OCCUPATIONAL PROFILE AND  HISTORY: Detailed assessment: Review of records and additional review of physical, cognitive, psychosocial history related to current functional performance.   CLINICAL DECISION MAKING: Moderate - several treatment options, min-mod task modification necessary   REHAB POTENTIAL: Good   EVALUATION COMPLEXITY: Moderate      PLAN: OT FREQUENCY: 2x/week   OT DURATION: 12 weeks   PLANNED INTERVENTIONS: self care/ADL training, therapeutic exercise, therapeutic activity, neuromuscular re-education, manual therapy, passive range of motion, functional mobility training, electrical stimulation, and paraffin   RECOMMENDED OTHER SERVICES: PT   CONSULTED AND AGREED WITH PLAN OF CARE: Patient and family member/caregiver   PLAN FOR NEXT SESSION: Initiate OT treatment   Harrel Carina, MS, OTR/L   Harrel Carina, OT 03/05/2023, 5:43 PM

## 2023-03-10 ENCOUNTER — Ambulatory Visit: Payer: Medicare HMO | Admitting: Occupational Therapy

## 2023-03-10 DIAGNOSIS — M1712 Unilateral primary osteoarthritis, left knee: Secondary | ICD-10-CM | POA: Diagnosis not present

## 2023-03-12 ENCOUNTER — Ambulatory Visit: Payer: Medicare HMO | Attending: Physical Medicine and Rehabilitation | Admitting: Occupational Therapy

## 2023-03-12 DIAGNOSIS — R531 Weakness: Secondary | ICD-10-CM | POA: Diagnosis not present

## 2023-03-12 DIAGNOSIS — M6281 Muscle weakness (generalized): Secondary | ICD-10-CM | POA: Insufficient documentation

## 2023-03-12 DIAGNOSIS — R278 Other lack of coordination: Secondary | ICD-10-CM | POA: Diagnosis not present

## 2023-03-12 DIAGNOSIS — I6381 Other cerebral infarction due to occlusion or stenosis of small artery: Secondary | ICD-10-CM | POA: Insufficient documentation

## 2023-03-12 NOTE — Therapy (Signed)
Occupational Therapy Treatment Note  Patient Name: [name] MRN: [id number] DOB:[birth year], 82 y.o., female Today's Date: 03/12/2023  PCP: Dr. Doy Hutching REFERRING PROVIDER:  Dr. Doy Hutching  END OF SESSION:  OT End of Session - 03/12/23 1605     Visit Number 22    Number of Visits 32    Date for OT Re-Evaluation 05/26/23    OT Start Time 1605    OT Stop Time 1645    OT Time Calculation (min) 40 min    Activity Tolerance Patient limited by fatigue    Behavior During Therapy [facility] for tasks assessed/performed               Past Medical History:  Diagnosis Date   Anemia    Arthritis    Asthma    uses inhaler just prior to surgery to avoid attack   Back pain    from previous injury   Complication of anesthesia    has woken  up during 2 different surgery   Depression    no current issue/treatment; situation   Gallstones    GERD (gastroesophageal reflux disease)    Hiatal hernia    patient does NOT have nerve/muscle disease   History of kidney stones    HLD (hyperlipidemia)    HTN (hypertension)    Hypothyroidism    Kidney stones    Knee pain    Nausea and vomiting 10/15/2022   Non-diabetic pancreatic hormone dysfunction years   pt. 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   Pancreatitis    Pneumonia    Seizures ([place])    caused by dye injected during a procedure   Shortness of breath    with exertion   Sinus problem    frequent infections/congestion   Stroke ([place]) 2021   reports having CVA in 2021 and having mini strokes before that   Thyroid disease    Past Surgical History:  Procedure Laterality Date   ABDOMINAL HYSTERECTOMY     APPENDECTOMY     CARPAL TUNNEL RELEASE  10+ years ago   bilateral   EYE SURGERY  3 yrs ago   bilateral cataracts   FOOT OSTEOTOMY  6 weeks ago   Left foot: great, 2nd & 3rd   FOOT OSTEOTOMY  5 years ago   Right great toe   HAND SURGERY Bilateral 2011-most recent   multiple hand surgeries, 2 on left, 3 on right   KNEE  ARTHROPLASTY Right 04/28/2022   Procedure: COMPUTER ASSISTED TOTAL KNEE ARTHROPLASTY;  Surgeon: Dereck Leep, MD;  Location: [facility];  Service: Orthopedics;  Laterality: Right;   LOOP RECORDER INSERTION N/A 05/16/2020   Procedure: LOOP RECORDER INSERTION;  Surgeon: Isaias Cowman, MD;  Location: [place] [facility];  Service: Cardiovascular;  Laterality: N/A;   NASAL SINUS SURGERY  most recent 7-8 yrs ago   7 sinus surgeries    TRIGGER FINGER RELEASE  11/19/2011   Procedure: RELEASE TRIGGER FINGER/A-1 PULLEY;  Surgeon: Wynonia Sours, MD;  Location: [place];  Service: Orthopedics;  Laterality: Right;  release a-1 pulley right index finger and cyst removal   WRIST GANGLION EXCISION  1980's   right   Patient Active Problem List   Diagnosis Date Noted   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 11/27/2022   Hypomagnesemia 11/27/2022   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   HLD (hyperlipidemia) 11/23/2022   Asthma 11/23/2022   Depression with anxiety 11/23/2022   Chronic diastolic CHF (congestive heart failure)  11/23/2022   Normocytic anemia 11/23/2022   Aspiration pneumonia 11/23/2022   AKI (acute kidney injury) 11/23/2022   Abdominal pain 11/23/2022   Mesenteric mass 11/23/2022   Coffee ground emesis 11/23/2022   Nausea and vomiting 10/15/2022   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   Fatigue 09/17/2022   Left hemiparesis 09/17/2022   Right thalamic stroke 08/22/2022   GERD (gastroesophageal reflux disease) 08/21/2022   Agitation 08/20/2022   Acute left-sided weakness 08/20/2022   Expressive aphasia    Stroke 08/19/2022   Leukocytosis 08/19/2022   History of urticaria 04/28/2022   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   Primary osteoarthritis of left knee 02/24/2022   Primary osteoarthritis of right knee 02/24/2022   Lumbar spondylolysis 04/12/2020   History of CVA (cerebrovascular accident) 03/26/2020   Low back pain radiating  to right lower extremity 03/21/2020   B12 deficiency 03/06/2020   Positive anti-CCP test 12/21/2019   Arthralgia 12/13/2019   Dermatitis 12/13/2019   Rheumatoid factor positive 12/13/2019   Essential hypertension 12/11/2018   Palpitations 12/11/2018   Acquired hypothyroidism 11/10/2018   Arthritis of knee 09/17/2016   Anxiety 11/22/2014   Asthma without status asthmaticus 11/22/2014   Benign neoplasm of colon, unspecified 11/22/2014   Environmental allergies 11/22/2014   Hypertriglyceridemia 11/22/2014   Hypokalemia 11/22/2014   Personal history of disease of skin and subcutaneous tissue 11/22/2014   REFERRING DIAG: CVA   THERAPY DIAG:  Muscle weakness (generalized)   Other lack of coordination   Rationale for Evaluation and Treatment Rehabilitation   SUBJECTIVE:   Pt. Reports that her neck is feeling better than the other day, which caused her to cancel therapy.   SUBJECTIVE STATEMENT:   Pt accompanied by: significant other   PERTINENT HISTORY: Patient is an 82 year-old female who was admitted to [facility] on 11/23/2022 with a cerebellar CVA. 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 Pt. Was admitted to Inpatient Rehab from 12/21-1/02/2023. 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  Patient underwent inpatient rehabilitation for 2 weeks.  Patient was assessed and was scheduled for a knee replacement on 08/29/2022 however had to cancel it due to having had a CVA.  Patient had a recent fall 2 days after discharging from inpatient rehab. Past Medical History includes: Knee replacement, essential HTN, hypokalemia, leukocytosis, seizures, positive anti-- CCP test, anxiety disorder, mini strokes.  Patient had shingles with left eye nerve pain s/p 1 year ago.    PRECAUTIONS: Fall   WEIGHT BEARING RESTRICTIONS No   PAIN:  Are you having pain?  No   FALLS: Has patient fallen in last 6 months? Yes.  Number of falls 1   LIVING ENVIRONMENT: Lives with: Lives with Spouse Lives in: House/apartment Stairs: 2 storey home, resides on the first floor.  External: 2 stairs front no rails, and 6 in back with rails Has following equipment at home: Single point cane, Walker - 2 wheeled, Environmental consultant - 4 wheeled, Shower bench, and bed side commode   PLOF: Independent   PATIENT GOALS  To Regain the use of her left arm   OBJECTIVE:    HAND DOMINANCE: Right   ADLs: Overall ADLs: Husband assists pt. as needed Transfers/ambulation related to ADLs:Pt. Uses a 3 wheeled walker with Husband assist. Eating: Pt. Is independent with the right hand. Pt. has difficulty cutting food. Grooming: Pt. is using her right hand, however has difficulty sustaining her LUE in elevation to assist with haircare. UB Dressing: Pt. Is  independent donning a pullover shirt, and button down shirt. Has difficulty with buttoning, LB Dressing:  Independent donning pants, and socks. Difficulty tying shoes. Toileting: Independent Bathing: Pt. Is able to engage her right hand. Tub Shower transfers: Supervision Equipment: See above for equipment     IADLs: Shopping:  Has not had the opportunity for grocery shopping yet Light housekeeping: Husband is assisting with light house keeping Meal Prep:  Dependent Community mobility: Relies of family/friends Medication management: Husband assisting with weekly pillbox set-up, and administering medication. Financial management: TBD Handwriting: 75% legible   MOBILITY STATUS: Hx of falls   POSTURE COMMENTS:  No Significant postural limitations Sitting balance: supported sitting balance WFL   ACTIVITY TOLERANCE: Activity tolerance:  Fatigues in greater than 30 min.    FUNCTIONAL OUTCOME MEASURES: FOTO: 57   UPPER EXTREMITY ROM      Active ROM Right Eval: WFL Left eval Left  01/22/23 Left  03/03/23  Shoulder flexion   132 100 108  Shoulder abduction   80 85 85  Shoulder  adduction        Shoulder extension        Shoulder internal rotation        Shoulder external rotation        Elbow flexion   140 140 WFL  Elbow extension   WNL WNL WFL  Wrist flexion   65 68   Wrist extension   -10 20 24   Wrist ulnar deviation     12 10  Wrist radial deviation     8 14  Wrist pronation        Wrist supination        (Blank rows = not tested)   Left digit flexion to [facility]: 2nd: 0cm, 3rd: 0cm, 4th: 0cm, 5th: 0cm   Limited Left full 2nd digit extension     UPPER EXTREMITY MMT:      MMT Right Eval: 4+/5 overall Left Eval Left 01/22/23 Left 03/03/23  Shoulder flexion   3/5 3-/5 3-/5  Shoulder abduction   3-/5 3-/5 3-/5  Shoulder adduction        Shoulder extension        Shoulder internal rotation        Shoulder external rotation        Middle trapezius        Lower trapezius        Elbow flexion   3+/5 4/5 N/A  Elbow extension   3+/5 4/4 N/A  Wrist flexion        Wrist extension   2-/5 3-/5 3-/5  Wrist ulnar deviation        Wrist radial deviation        Wrist pronation        Wrist supination        (Blank rows = not tested)   HAND FUNCTION: Grip strength: Right: 26#, Left: 10# Pinch strength: Right 8#, Left: 3#, 3 Pt. Pinch strength: Right: 9#, L: 2#  01/22/2023 Grip strength: Right: 26#, Left: 12# Pinch strength:  Pinch meter used at the initial eval has been sent out for recalibration   03/03/2023 Grip strength: Right: 26#, Left: 13# Pinch strength:  Pinch meter used at the initial eval has been sent out for recalibration     COORDINATION: Right: 22 sec., Left: <5 min. To place 7 pegs with increased compensation proximally in the trunk, and through reflexive associated reactions.  01/22/23 Right: 22 sec., Left: 3 min. & 4 sec.  03/03/23  Right: 22 sec., Left: 1 min. & 39 sec.   SENSATIO   SENSATION: Light touch: WFL, proprioceptive awareness: Intact   EDEMA: N/A   MUSCLE TONE: LUE: Hypotonic   COGNITION: Overall cognitive status:  WFL for tasks assessed. Pt. Is impulsive at times.   VISION: Subjective report: Pt. report having shingles affecting left eye  s/p 1 year. Has nerve pain Baseline vision: Wears glasses for reading only Visual history:  updated see clinical impression   VISION ASSESSMENT:    WFL for tasks performed   PERCEPTION: Intact   PRAXIS: Impaired: Motor planning   OBSERVATIONS:  Pt. more alert, and engaging since prior to the most recent hospitalization.     TODAY'S TREATMENT:   Therapeutic Ex:  Pt. worked on [employer], and reciprocal motion using the UBE while seated for 8 min. with no resistance.   Neuromuscular re-education:  Pt. worked on [facility] skills grasping and removing the 1" vertical pegs using bilateral alternating hand patterns on the [employer]. Pt. worked on grasping, and [employer] style discs with the left hand with emphasis placed on 2nd digit extension when releasing the discs onto the table.     PATIENT EDUCATION: Education details: left hand digit extension Person educated: Patient and Spouse Education method: Customer service manager Education comprehension: verbalized understanding, returned demonstration, and needs further education     HOME EXERCISE PROGRAM:    Reviewed activities at home to promote isolated 2nd digit extension.       GOALS: Goals reviewed with patient? Yes   SHORT TERM GOALS: Target date: 01/27/2023       1. Patient will be independent with home exercise program for the left upper extremity Baseline: 02/01/2023: Pt. Consistently attempts to perform HEPs independently. No current home exercise program Goal status: Ongoing     LONG TERM GOALS: Target date: 03/10/2023     Patient will improve left shoulder strength by 2 mm grades to be able to sustain UE's in elevation long enough to wash her hair.  Baseline: 03/03/2023: Left shoulder flexion: 3-/5, abduction: 3-/5 01/22/2023: Left shoulder flexion: 3-/5, abduction:  3-/5 Eval: Left shoulder flexion: 3/5, abduction: 3-/5 Goal status: Ongoing   2.  Patient will improve left shoulder active abduction to be able to comb her hair Baseline: 03/03/2023: Shoulder abduction: 85 01/22/2023: Shoulder abduction: 85 Eval: Left shoulder abduction is 80(108) Goal status: Ongoing   3.  Patient will independently button her shirt with modified independence. Baseline: 03/03/2023: Pt. Continues to have difficulty with buttoning. 01/22/2023: Pt. continues to have difficulty. Eval: Patient has difficulty.  Goal status: Ongoing   4.  Patient well improve left grip strength in preparation  for securely holding flowers. Baseline:03/03/2023: left grip strength: 13# 01/22/2023: Left: 12# Eval: Pt. Is unable to securely hold flowers. Goal status: Ongoing   5.  Pt. will independently recall adaptive  strategies for performing ADL tasks including: flossing teeth, donning bra, applying makeup. Baseline: 03/03/2023: Continue 01/22/2023: Pt. continues to benefit from education about adaptive strategies during ADLs, and IADLs. Eval: Pt. to be provided with adaptive strategies. Goal status: Ongoing   6.  Pt. will improve Foto score by 2 points to reflect patient perceived performance improvement assessment specific ADLs  and IADLs Baseline: 03/03/23: FOTO 62 Eval: 57 Goal status: Ongoing  7.  Pt. will improve left hand coordination skills in order to be able to handle, and sort utensils in a drawer.     Baseline: 03/03/23: 1 min. & 39 01/22/2022: Left:  3 min. & 4 sec. Eval: Pt. has difficulty sorting, and placing utensils with the left hand. Left FMC : >5 min. For 7 pegs on the 9 hole peg test.    Goal Status:  Ongoing  8. Pt. will improve active left 2nd digit extension to be able able to isolate her 2nd digit in preparation for pressing/pushing buttons on appliances, phones, or remotes. Baseline: 03/03/23: Pt. Continues to work on improving consistency with 2nd digit extension to press the  remote. 01/22/2023: is able to perform full digit extension, although 2nd digit is slow to extend s 2/2 flexor tone. Pt.  Eval: Pt. is able to is unable to actively perform full digit extension Goal status:  Ongoing   ASSESSMENT:   CLINICAL IMPRESSION:  Pt. reports that her neck pain is better, and tolerated the UBE without difficulty, or pain. Pt. was able to perform [facility] skills at the tabletop, however presented with neck discomfort/fatigue from sustained  looking down at the tabletop during the [place] Task. The [facility] task was modified, and placed at an elevated surface closer to eye level. Pt. had good tolerance for activity in this position. Pt. required cues for proprioception through the right 2nd digit. Pt. continues to work on improving left shoulder elbow and wrist strength, improving motor control and coordination in order to prepare the left upper extremity and hand for functional reaching, and engaging the left upper extremity when performing hair care, buttoning, securely holding flowers, cutting food, and manipulating/handling/sorting utensils from a drawer for setting a table, as well as adaptive and compensatory strategies during daily ADL and IADL care.  PERFORMANCE DEFICITS in functional skills including ADLs, IADLs, coordination, proprioception, ROM, strength, [place], and GMC, cognitive skills including memory, and psychosocial skills including coping strategies, environmental adaptation, interpersonal interactions, and routines and behaviors.    IMPAIRMENTS are limiting patient from ADLs, IADLs, education, leisure, and social participation.    COMORBIDITIES may have co-morbidities  that affects occupational performance. Patient will benefit from skilled OT to address above impairments and improve overall function.   MODIFICATION OR ASSISTANCE TO COMPLETE EVALUATION: Min-Moderate modification of tasks or assist with assess necessary to complete an evaluation.   OT  OCCUPATIONAL PROFILE AND HISTORY: Detailed assessment: Review of records and additional review of physical, cognitive, psychosocial history related to current functional performance.   CLINICAL DECISION MAKING: Moderate - several treatment options, min-mod task modification necessary   REHAB POTENTIAL: Good   EVALUATION COMPLEXITY: Moderate      PLAN: OT FREQUENCY: 2x/week   OT DURATION: 12 weeks   PLANNED INTERVENTIONS: self care/ADL training, therapeutic exercise, therapeutic activity, neuromuscular re-education, manual therapy, passive range of motion, functional mobility training, electrical stimulation, and paraffin   RECOMMENDED OTHER SERVICES: PT   CONSULTED AND AGREED WITH PLAN OF CARE: Patient and family member/caregiver   PLAN FOR NEXT SESSION: Initiate OT treatment   Harrel Carina, MS, OTR/L   Harrel Carina, OT 03/12/2023, 4:07 PM

## 2023-03-17 ENCOUNTER — Ambulatory Visit: Payer: Medicare HMO | Admitting: Occupational Therapy

## 2023-03-17 DIAGNOSIS — M6281 Muscle weakness (generalized): Secondary | ICD-10-CM | POA: Diagnosis not present

## 2023-03-17 DIAGNOSIS — R278 Other lack of coordination: Secondary | ICD-10-CM | POA: Diagnosis not present

## 2023-03-17 DIAGNOSIS — R531 Weakness: Secondary | ICD-10-CM | POA: Diagnosis not present

## 2023-03-17 DIAGNOSIS — I6381 Other cerebral infarction due to occlusion or stenosis of small artery: Secondary | ICD-10-CM | POA: Diagnosis not present

## 2023-03-17 NOTE — Therapy (Signed)
Occupational Therapy Treatment Note  Patient Name: [name] MRN: [id number] DOB:1941-04-17, 82 y.o., female Today's Date: 03/17/2023  PCP: Dr. Judithann SheenSparks REFERRING PROVIDER:  Dr. Judithann SheenSparks  END OF SESSION:  OT End of Session - 03/17/23 1759     Visit Number 23    Number of Visits 48    Date for OT Re-Evaluation 05/26/23    Authorization Time Period Progress report period starting 12/16/2022    OT Start Time 1645    OT Stop Time 1730    OT Time Calculation (min) 45 min    Activity Tolerance Patient limited by fatigue    Behavior During Therapy WFL for tasks assessed/performed               Past Medical History:  Diagnosis Date   Anemia    Arthritis    Asthma    uses inhaler just prior to surgery to avoid attack   Back pain    from previous injury   Complication of anesthesia    has woken  up during 2 different surgery   Depression    no current issue/treatment; situation   Gallstones    GERD (gastroesophageal reflux disease)    Hiatal hernia    patient does NOT have nerve/muscle disease   History of kidney stones    HLD (hyperlipidemia)    HTN (hypertension)    Hypothyroidism    Kidney stones    Knee pain    Nausea and vomiting 10/15/2022   Non-diabetic pancreatic hormone dysfunction years   pt. 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   Pancreatitis    Pneumonia    Seizures (HCC)    caused by dye injected during a procedure   Shortness of breath    with exertion   Sinus problem    frequent infections/congestion   Stroke (HCC) 2021   reports having CVA in 2021 and having mini strokes before that   Thyroid disease    Past Surgical History:  Procedure Laterality Date   ABDOMINAL HYSTERECTOMY     APPENDECTOMY     CARPAL TUNNEL RELEASE  10+ years ago   bilateral   EYE SURGERY  3 yrs ago   bilateral cataracts   FOOT OSTEOTOMY  6 weeks ago   Left foot: great, 2nd & 3rd   FOOT OSTEOTOMY  5 years ago   Right great toe   HAND SURGERY Bilateral 2011-most  recent   multiple hand surgeries, 2 on left, 3 on right   KNEE ARTHROPLASTY Right 04/28/2022   Procedure: COMPUTER ASSISTED TOTAL KNEE ARTHROPLASTY;  Surgeon: Donato HeinzHooten, James P, MD;  Location: [facility];  Service: Orthopedics;  Laterality: Right;   LOOP RECORDER INSERTION N/A 05/16/2020   Procedure: LOOP RECORDER INSERTION;  Surgeon: Marcina MillardParaschos, Alexander, MD;  Location: [facility];  Service: Cardiovascular;  Laterality: N/A;   NASAL SINUS SURGERY  most recent 7-8 yrs ago   7 sinus surgeries    TRIGGER FINGER RELEASE  11/19/2011   Procedure: RELEASE TRIGGER FINGER/A-1 PULLEY;  Surgeon: Nicki ReaperGary R Kuzma, MD;  Location: [facility];  Service: Orthopedics;  Laterality: Right;  release a-1 pulley right index finger and cyst removal   WRIST GANGLION EXCISION  1980's   right   Patient Active Problem List   Diagnosis Date Noted   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 11/27/2022   Hypomagnesemia 11/27/2022   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   HLD (hyperlipidemia) 11/23/2022   Asthma 11/23/2022   Depression  with anxiety 11/23/2022   Chronic diastolic CHF (congestive heart failure) 11/23/2022   Normocytic anemia 11/23/2022   Aspiration pneumonia 11/23/2022   AKI (acute kidney injury) 11/23/2022   Abdominal pain 11/23/2022   Mesenteric mass 11/23/2022   Coffee ground emesis 11/23/2022   Nausea and vomiting 10/15/2022   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   Fatigue 09/17/2022   Left hemiparesis 09/17/2022   Right thalamic stroke 08/22/2022   GERD (gastroesophageal reflux disease) 08/21/2022   Agitation 08/20/2022   Acute left-sided weakness 08/20/2022   Expressive aphasia    Stroke 08/19/2022   Leukocytosis 08/19/2022   History of urticaria 04/28/2022   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   Primary osteoarthritis of left knee 02/24/2022   Primary osteoarthritis of right knee 02/24/2022   Lumbar spondylolysis 04/12/2020   History of CVA  (cerebrovascular accident) 03/26/2020   Low back pain radiating to right lower extremity 03/21/2020   B12 deficiency 03/06/2020   Positive anti-CCP test 12/21/2019   Arthralgia 12/13/2019   Dermatitis 12/13/2019   Rheumatoid factor positive 12/13/2019   Essential hypertension 12/11/2018   Palpitations 12/11/2018   Acquired hypothyroidism 11/10/2018   Arthritis of knee 09/17/2016   Anxiety 11/22/2014   Asthma without status asthmaticus 11/22/2014   Benign neoplasm of colon, unspecified 11/22/2014   Environmental allergies 11/22/2014   Hypertriglyceridemia 11/22/2014   Hypokalemia 11/22/2014   Personal history of disease of skin and subcutaneous tissue 11/22/2014   REFERRING DIAG: CVA   THERAPY DIAG:  Muscle weakness (generalized)   Other lack of coordination   Rationale for Evaluation and Treatment Rehabilitation   SUBJECTIVE:   Pt. Reports that her neck is feeling better this week.   SUBJECTIVE STATEMENT:   Pt accompanied by: significant other   PERTINENT HISTORY: Patient is an 82 year-old female who was admitted to  [facility]23/2022 with a cerebellar CVA. 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 Pt. Was admitted to Inpatient Rehab from 12/21-1/02/2023. 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  Patient underwent inpatient rehabilitation for 2 weeks.  Patient was assessed and was scheduled for a knee replacement on 08/29/2022 however had to cancel it due to having had a CVA.  Patient had a recent fall 2 days after discharging from inpatient rehab. Past Medical History includes: Knee replacement, essential HTN, hypokalemia, leukocytosis, seizures, positive anti-- CCP test, anxiety disorder, mini strokes.  Patient had shingles with left eye nerve pain s/p 1 year ago.    PRECAUTIONS: Fall   WEIGHT BEARING RESTRICTIONS No   PAIN:  Are you having pain?  No   FALLS: Has patient fallen in  last 6 months? Yes. Number of falls 1   LIVING ENVIRONMENT: Lives with: Lives with Spouse Lives in: House/apartment Stairs: 2 storey home, resides on the first floor.  External: 2 stairs front no rails, and 6 in back with rails Has following equipment at home: Single point cane, Walker - 2 wheeled, Environmental consultant - 4 wheeled, Shower bench, and bed side commode   PLOF: Independent   PATIENT GOALS  To Regain the use of her left arm   OBJECTIVE:    HAND DOMINANCE: Right   ADLs: Overall ADLs: Husband assists pt. as needed Transfers/ambulation related to ADLs:Pt. Uses a 3 wheeled walker with Husband assist. Eating: Pt. Is independent with the right hand. Pt. has difficulty cutting food. Grooming: Pt. is using her right hand, however has difficulty sustaining her LUE in elevation to assist with haircare. UB  Dressing: Pt. Is independent donning a pullover shirt, and button down shirt. Has difficulty with buttoning, LB Dressing:  Independent donning pants, and socks. Difficulty tying shoes. Toileting: Independent Bathing: Pt. Is able to engage her right hand. Tub Shower transfers: Supervision Equipment: See above for equipment     IADLs: Shopping:  Has not had the opportunity for grocery shopping yet Light housekeeping: Husband is assisting with light house keeping Meal Prep:  Dependent Community mobility: Relies of family/friends Medication management: Husband assisting with weekly pillbox set-up, and administering medication. Financial management: TBD Handwriting: 75% legible   MOBILITY STATUS: Hx of falls   POSTURE COMMENTS:  No Significant postural limitations Sitting balance: supported sitting balance WFL   ACTIVITY TOLERANCE: Activity tolerance:  Fatigues in greater than 30 min.    FUNCTIONAL OUTCOME MEASURES: FOTO: 57   UPPER EXTREMITY ROM      Active ROM Right Eval: WFL Left eval Left  01/22/23 Left  03/03/23  Shoulder flexion   132 100 108  Shoulder abduction   80 85  85  Shoulder adduction        Shoulder extension        Shoulder internal rotation        Shoulder external rotation        Elbow flexion   140 140 WFL  Elbow extension   WNL WNL WFL  Wrist flexion   65 68   Wrist extension   -10 20 24   Wrist ulnar deviation     12 10  Wrist radial deviation     8 14  Wrist pronation        Wrist supination        (Blank rows = not tested)   Left digit flexion to [facility]: 2nd: 0cm, 3rd: 0cm, 4th: 0cm, 5th: 0cm   Limited Left full 2nd digit extension     UPPER EXTREMITY MMT:      MMT Right Eval: 4+/5 overall Left Eval Left 01/22/23 Left 03/03/23  Shoulder flexion   3/5 3-/5 3-/5  Shoulder abduction   3-/5 3-/5 3-/5  Shoulder adduction        Shoulder extension        Shoulder internal rotation        Shoulder external rotation        Middle trapezius        Lower trapezius        Elbow flexion   3+/5 4/5 N/A  Elbow extension   3+/5 4/4 N/A  Wrist flexion        Wrist extension   2-/5 3-/5 3-/5  Wrist ulnar deviation        Wrist radial deviation        Wrist pronation        Wrist supination        (Blank rows = not tested)   HAND FUNCTION: Grip strength: Right: 26#, Left: 10# Pinch strength: Right 8#, Left: 3#, 3 Pt. Pinch strength: Right: 9#, L: 2#  01/22/2023 Grip strength: Right: 26#, Left: 12# Pinch strength:  Pinch meter used at the initial eval has been sent out for recalibration   03/03/2023 Grip strength: Right: 26#, Left: 13# Pinch strength:  Pinch meter used at the initial eval has been sent out for recalibration     COORDINATION: Right: 22 sec., Left: <5 min. To place 7 pegs with increased compensation proximally in the trunk, and through reflexive associated reactions.  01/22/23 Right: 22 sec., Left: 3 min. & 4  sec.  03/03/23 Right: 22 sec., Left: 1 min. & 39 sec.   SENSATIO   SENSATION: Light touch: WFL, proprioceptive awareness: Intact   EDEMA: N/A   MUSCLE TONE: LUE: Hypotonic   COGNITION: Overall  cognitive status: WFL for tasks assessed. Pt. Is impulsive at times.   VISION: Subjective report: Pt. report having shingles affecting left eye  s/p 1 year. Has nerve pain Baseline vision: Wears glasses for reading only Visual history:  updated see clinical impression   VISION ASSESSMENT:    WFL for tasks performed   PERCEPTION: Intact   PRAXIS: Impaired: Motor planning   OBSERVATIONS:  Pt. more alert, and engaging since prior to the most recent hospitalization.     TODAY'S TREATMENT:   Therapeutic Ex:  Pt. worked on [employer], and reciprocal motion using the UBE while seated for 8 min. with no resistance.   Neuromuscular re-education:  Pt. worked on using the left hand for grasping, and manipulating 1", 3/4",and 1/2" washers from a magnetic dish using a 2pt. grasp pattern. Pt. worked on reaching up, stabilizing, and sustaining shoulder elevation while placing the washer over a small precise target on vertical dowels positioned at various angles.       PATIENT EDUCATION: Education details: left hand digit extension Person educated: Patient and Spouse Education method: Medical illustrator Education comprehension: verbalized understanding, returned demonstration, and needs further education     HOME EXERCISE PROGRAM:    Reviewed activities at home to promote isolated 2nd digit extension.       GOALS: Goals reviewed with patient? Yes   SHORT TERM GOALS: Target date: 01/27/2023       1. Patient will be independent with home exercise program for the left upper extremity Baseline: 02/01/2023: Pt. Consistently attempts to perform HEPs independently. No current home exercise program Goal status: Ongoing     LONG TERM GOALS: Target date: 03/10/2023     Patient will improve left shoulder strength by 2 mm grades to be able to sustain UE's in elevation long enough to wash her hair.  Baseline: 03/03/2023: Left shoulder flexion: 3-/5, abduction: 3-/5 01/22/2023:  Left shoulder flexion: 3-/5, abduction: 3-/5 Eval: Left shoulder flexion: 3/5, abduction: 3-/5 Goal status: Ongoing   2.  Patient will improve left shoulder active abduction to be able to comb her hair Baseline: 03/03/2023: Shoulder abduction: 85 01/22/2023: Shoulder abduction: 85 Eval: Left shoulder abduction is 80(108) Goal status: Ongoing   3.  Patient will independently button her shirt with modified independence. Baseline: 03/03/2023: Pt. Continues to have difficulty with buttoning. 01/22/2023: Pt. continues to have difficulty. Eval: Patient has difficulty.  Goal status: Ongoing   4.  Patient well improve left grip strength in preparation  for securely holding flowers. Baseline:03/03/2023: left grip strength: 13# 01/22/2023: Left: 12# Eval: Pt. Is unable to securely hold flowers. Goal status: Ongoing   5.  Pt. will independently recall adaptive  strategies for performing ADL tasks including: flossing teeth, donning bra, applying makeup. Baseline: 03/03/2023: Continue 01/22/2023: Pt. continues to benefit from education about adaptive strategies during ADLs, and IADLs. Eval: Pt. to be provided with adaptive strategies. Goal status: Ongoing   6.  Pt. will improve Foto score by 2 points to reflect patient perceived performance improvement assessment specific ADLs  and IADLs Baseline: 06/07/15: FOTO 62 Eval: 57 Goal status: Ongoing  7.  Pt. will improve left hand coordination skills in order to be able to handle, and sort utensils in a drawer.     Baseline:  03/03/23: 1 min. & 39 01/22/2022: Left: 3 min. & 4 sec. Eval: Pt. has difficulty sorting, and placing utensils with the left hand. Left FMC : >5 min. For 7 pegs on the 9 hole peg test.    Goal Status:  Ongoing  8. Pt. will improve active left 2nd digit extension to be able able to isolate her 2nd digit in preparation for pressing/pushing buttons on appliances, phones, or remotes. Baseline: 03/03/23: Pt. Continues to work on improving consistency  with 2nd digit extension to press the remote. 01/22/2023: is able to perform full digit extension, although 2nd digit is slow to extend s 2/2 flexor tone. Pt.  Eval: Pt. is able to is unable to actively perform full digit extension Goal status:  Ongoing   ASSESSMENT:   CLINICAL IMPRESSION:  Pt. reports that her neck pain is better, and tolerated the UBE without difficulty, or pain.  Pt. presented with increased left thumb IP flexion when attempting to grasp the washers from the dish, and required alternating proprioception through the left hand at the tabletop during the task to normalize the tone. The task was modified from reaching up to place them onto vertical dowels to stacking them at the tabletop. Pt. continues to work on improving left shoulder elbow and wrist strength, improving motor control and coordination in order to prepare the left upper extremity and hand for functional reaching, and engaging the left upper extremity when performing hair care, buttoning, securely holding flowers, cutting food, and manipulating/handling/sorting utensils from a drawer for setting a table, as well as adaptive and compensatory strategies during daily ADL and IADL care.  PERFORMANCE DEFICITS in functional skills including ADLs, IADLs, coordination, proprioception, ROM, strength, FMC, and GMC, cognitive skills including memory, and psychosocial skills including coping strategies, environmental adaptation, interpersonal interactions, and routines and behaviors.    IMPAIRMENTS are limiting patient from ADLs, IADLs, education, leisure, and social participation.    COMORBIDITIES may have co-morbidities  that affects occupational performance. Patient will benefit from skilled OT to address above impairments and improve overall function.   MODIFICATION OR ASSISTANCE TO COMPLETE EVALUATION: Min-Moderate modification of tasks or assist with assess necessary to complete an evaluation.   OT OCCUPATIONAL PROFILE AND  HISTORY: Detailed assessment: Review of records and additional review of physical, cognitive, psychosocial history related to current functional performance.   CLINICAL DECISION MAKING: Moderate - several treatment options, min-mod task modification necessary   REHAB POTENTIAL: Good   EVALUATION COMPLEXITY: Moderate      PLAN: OT FREQUENCY: 2x/week   OT DURATION: 12 weeks   PLANNED INTERVENTIONS: self care/ADL training, therapeutic exercise, therapeutic activity, neuromuscular re-education, manual therapy, passive range of motion, functional mobility training, electrical stimulation, and paraffin   RECOMMENDED OTHER SERVICES: PT   CONSULTED AND AGREED WITH PLAN OF CARE: Patient and family member/caregiver   PLAN FOR NEXT SESSION: Initiate OT treatment   Olegario Messier, MS, OTR/L   Olegario Messier, OT 03/17/2023, 6:16 PM

## 2023-03-19 ENCOUNTER — Ambulatory Visit: Payer: Medicare HMO | Admitting: Occupational Therapy

## 2023-03-19 DIAGNOSIS — R278 Other lack of coordination: Secondary | ICD-10-CM | POA: Diagnosis not present

## 2023-03-19 DIAGNOSIS — M6281 Muscle weakness (generalized): Secondary | ICD-10-CM

## 2023-03-19 DIAGNOSIS — I6381 Other cerebral infarction due to occlusion or stenosis of small artery: Secondary | ICD-10-CM | POA: Diagnosis not present

## 2023-03-19 DIAGNOSIS — R531 Weakness: Secondary | ICD-10-CM | POA: Diagnosis not present

## 2023-03-19 NOTE — Therapy (Addendum)
Occupational Therapy Treatment Note  Patient Name: Stephanie SarasLana C Castillo MRN: [id number] DOB:05-03-1941, 82 y.o., female Today's Date: 03/19/2023  PCP: Dr. Judithann SheenSparks REFERRING PROVIDER:  Dr. Judithann SheenSparks  END OF SESSION:  OT End of Session - 03/19/23 1747     Visit Number 24    Number of Visits 48    Date for OT Re-Evaluation 05/26/23    OT Start Time 1620    OT Stop Time 1645    OT Time Calculation (min) 25 min    Activity Tolerance Patient limited by fatigue    [facility] assessed/performed               Past Medical History:  Diagnosis Date   Anemia    Arthritis    Asthma    uses inhaler just prior to surgery to avoid attack   Back pain    from previous injury   Complication of anesthesia    has woken  up during 2 different surgery   Depression    no current issue/treatment; situation   Gallstones    GERD (gastroesophageal reflux disease)    Hiatal hernia    patient does NOT have nerve/muscle disease   History of kidney stones    HLD (hyperlipidemia)    HTN (hypertension)    Hypothyroidism    Kidney stones    Knee pain    Nausea and vomiting 10/15/2022   Non-diabetic pancreatic hormone dysfunction years   pt. 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   Pancreatitis    Pneumonia    Seizures (HCC)    caused by dye injected during a procedure   Shortness of breath    with exertion   Sinus problem    frequent infections/congestion   Stroke (HCC) 2021   reports having CVA in 2021 and having mini strokes before that   Thyroid disease    Past Surgical History:  Procedure Laterality Date   ABDOMINAL HYSTERECTOMY     APPENDECTOMY     CARPAL TUNNEL RELEASE  10+ years ago   bilateral   EYE SURGERY  3 yrs ago   bilateral cataracts   FOOT OSTEOTOMY  6 weeks ago   Left foot: great, 2nd & 3rd   FOOT OSTEOTOMY  5 years ago   Right great toe   HAND SURGERY Bilateral 2011-most recent   multiple hand surgeries, 2 on left, 3 on right   KNEE  ARTHROPLASTY Right 04/28/2022   Procedure: COMPUTER ASSISTED TOTAL KNEE ARTHROPLASTY;  Surgeon: Donato HeinzHooten, James P, MD;  Location: [facility];  Service: Orthopedics;  Laterality: Right;   LOOP RECORDER INSERTION N/A 05/16/2020   Procedure: LOOP RECORDER INSERTION;  Surgeon: Marcina MillardParaschos, Alexander, MD;  Location: [facility];  Service: Cardiovascular;  Laterality: N/A;   NASAL SINUS SURGERY  most recent 7-8 yrs ago   7 sinus surgeries    TRIGGER FINGER RELEASE  11/19/2011   Procedure: RELEASE TRIGGER FINGER/A-1 PULLEY;  Surgeon: Nicki ReaperGary R Kuzma, MD;  Location: [facility];  Service: Orthopedics;  Laterality: Right;  release a-1 pulley right index finger and cyst removal   WRIST GANGLION EXCISION  1980's   right   Patient Active Problem List   Diagnosis Date Noted   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 11/27/2022   Hypomagnesemia 11/27/2022   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   HLD (hyperlipidemia) 11/23/2022   Asthma 11/23/2022   Depression with anxiety 11/23/2022   Chronic diastolic CHF (congestive heart failure)  11/23/2022   Normocytic anemia 11/23/2022   Aspiration pneumonia 11/23/2022   AKI (acute kidney injury) 11/23/2022   Abdominal pain 11/23/2022   Mesenteric mass 11/23/2022   Coffee ground emesis 11/23/2022   Nausea and vomiting 10/15/2022   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   Fatigue 09/17/2022   Left hemiparesis 09/17/2022   Right thalamic stroke 08/22/2022   GERD (gastroesophageal reflux disease) 08/21/2022   Agitation 08/20/2022   Acute left-sided weakness 08/20/2022   Expressive aphasia    Stroke 08/19/2022   Leukocytosis 08/19/2022   History of urticaria 04/28/2022   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   Primary osteoarthritis of left knee 02/24/2022   Primary osteoarthritis of right knee 02/24/2022   Lumbar spondylolysis 04/12/2020   History of CVA (cerebrovascular accident) 03/26/2020   Low back pain radiating  to right lower extremity 03/21/2020   B12 deficiency 03/06/2020   Positive anti-CCP test 12/21/2019   Arthralgia 12/13/2019   Dermatitis 12/13/2019   Rheumatoid factor positive 12/13/2019   Essential hypertension 12/11/2018   Palpitations 12/11/2018   Acquired hypothyroidism 11/10/2018   Arthritis of knee 09/17/2016   Anxiety 11/22/2014   Asthma without status asthmaticus 11/22/2014   Benign neoplasm of colon, unspecified 11/22/2014   Environmental allergies 11/22/2014   Hypertriglyceridemia 11/22/2014   Hypokalemia 11/22/2014   Personal history of disease of skin and subcutaneous tissue 11/22/2014   REFERRING DIAG: CVA   THERAPY DIAG:  Muscle weakness (generalized)   Other lack of coordination   Rationale for Evaluation and Treatment Rehabilitation   SUBJECTIVE:   Pt. Called the rehab clinic to inform us that she was running late for her appointment this afternoon.   SUBJECTIVE STATEMENT:   Pt accompanied by: significant other   PERTINENT HISTORY: Patient is an 82 year-old female who was admitted to [facility] on 11/23/2022 with a cerebellar CVA. 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 Pt. Was admitted to Inpatient Rehab from 12/21-1/02/2023. 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  Patient underwent inpatient rehabilitation for 2 weeks.  Patient was assessed and was scheduled for a knee replacement on 08/29/2022 however had to cancel it due to having had a CVA.  Patient had a recent fall 2 days after discharging from inpatient rehab. Past Medical History includes: Knee replacement, essential HTN, hypokalemia, leukocytosis, seizures, positive anti-- CCP test, anxiety disorder, mini strokes.  Patient had shingles with left eye nerve pain s/p 1 year ago.    PRECAUTIONS: Fall   WEIGHT BEARING RESTRICTIONS No   PAIN:  Are you having pain?  No   FALLS: Has patient fallen in last 6 months?  Yes. Number of falls 1   LIVING ENVIRONMENT: Lives with: Lives with Spouse Lives in: House/apartment Stairs: 2 storey home, resides on the first floor.  External: 2 stairs front no rails, and 6 in back with rails Has following equipment at home: Single point cane, Walker - 2 wheeled, Environmental consultant - 4 wheeled, Shower bench, and bed side commode   PLOF: Independent   PATIENT GOALS  To Regain the use of her left arm   OBJECTIVE:    HAND DOMINANCE: Right   ADLs: Overall ADLs: Husband assists pt. as needed Transfers/ambulation related to ADLs:Pt. Uses a 3 wheeled walker with Husband assist. Eating: Pt. Is independent with the right hand. Pt. has difficulty cutting food. Grooming: Pt. is using her right hand, however has difficulty sustaining her LUE in elevation to assist with haircare. UB Dressing: Pt. Is  independent donning a pullover shirt, and button down shirt. Has difficulty with buttoning, LB Dressing:  Independent donning pants, and socks. Difficulty tying shoes. Toileting: Independent Bathing: Pt. Is able to engage her right hand. Tub Shower transfers: Supervision Equipment: See above for equipment     IADLs: Shopping:  Has not had the opportunity for grocery shopping yet Light housekeeping: Husband is assisting with light house keeping Meal Prep:  Dependent Community mobility: Relies of family/friends Medication management: Husband assisting with weekly pillbox set-up, and administering medication. Financial management: TBD Handwriting: 75% legible   MOBILITY STATUS: Hx of falls   POSTURE COMMENTS:  No Significant postural limitations Sitting balance: supported sitting balance WFL   ACTIVITY TOLERANCE: Activity tolerance:  Fatigues in greater than 30 min.    FUNCTIONAL OUTCOME MEASURES: FOTO: 57   UPPER EXTREMITY ROM      Active ROM Right Eval: WFL Left eval Left  01/22/23 Left  03/03/23  Shoulder flexion   132 100 108  Shoulder abduction   80 85 85  Shoulder  adduction        Shoulder extension        Shoulder internal rotation        Shoulder external rotation        Elbow flexion   140 140 WFL  Elbow extension   WNL WNL WFL  Wrist flexion   65 68   Wrist extension   -10 20 24   Wrist ulnar deviation     12 10  Wrist radial deviation     8 14  Wrist pronation        Wrist supination        (Blank rows = not tested)   Left digit flexion to [facility]: 2nd: 0cm, 3rd: 0cm, 4th: 0cm, 5th: 0cm   Limited Left full 2nd digit extension     UPPER EXTREMITY MMT:      MMT Right Eval: 4+/5 overall Left Eval Left 01/22/23 Left 03/03/23  Shoulder flexion   3/5 3-/5 3-/5  Shoulder abduction   3-/5 3-/5 3-/5  Shoulder adduction        Shoulder extension        Shoulder internal rotation        Shoulder external rotation        Middle trapezius        Lower trapezius        Elbow flexion   3+/5 4/5 N/A  Elbow extension   3+/5 4/4 N/A  Wrist flexion        Wrist extension   2-/5 3-/5 3-/5  Wrist ulnar deviation        Wrist radial deviation        Wrist pronation        Wrist supination        (Blank rows = not tested)   HAND FUNCTION: Grip strength: Right: 26#, Left: 10# Pinch strength: Right 8#, Left: 3#, 3 Pt. Pinch strength: Right: 9#, L: 2#  01/22/2023 Grip strength: Right: 26#, Left: 12# Pinch strength:  Pinch meter used at the initial eval has been sent out for recalibration   03/03/2023 Grip strength: Right: 26#, Left: 13# Pinch strength:  Pinch meter used at the initial eval has been sent out for recalibration     COORDINATION: Right: 22 sec., Left: <5 min. To place 7 pegs with increased compensation proximally in the trunk, and through reflexive associated reactions.  01/22/23 Right: 22 sec., Left: 3 min. & 4 sec.  03/03/23  Right: 22 sec., Left: 1 min. & 39 sec.      SENSATION: Light touch: WFL, proprioceptive awareness: Intact   EDEMA: N/A   MUSCLE TONE: LUE: Hypotonic   COGNITION: Overall cognitive status: WFL for  tasks assessed. Pt. Is impulsive at times.   VISION: Subjective report: Pt. report having shingles affecting left eye  s/p 1 year. Has nerve pain Baseline vision: Wears glasses for reading only Visual history:  updated see clinical impression   VISION ASSESSMENT:    WFL for tasks performed   PERCEPTION: Intact   PRAXIS: Impaired: Motor planning   OBSERVATIONS:  Pt. more alert, and engaging since prior to the most recent hospitalization.     TODAY'S TREATMENT:   Therapeutic Ex:  Pt. worked on [employer], and reciprocal motion using the UBE while seated for 8 min. with no resistance. Pt. performed resistive EZ Board exercises for left  forearm supination/pronation, wrist flexion/extension using gross grasp, and lateral pinch (key) grasp. Pt. performed resistive EZ Board exercises angled in several planes to promote shoulder flexion, abduction, and wrist flexion, and extension while performing resistive wrist flexion and extension with a gross grip.      PATIENT EDUCATION: Education details: left hand digit extension Person educated: Patient and Spouse Education method: Medical illustrator Education comprehension: verbalized understanding, returned demonstration, and needs further education     HOME EXERCISE PROGRAM:    Reviewed activities at home to promote isolated 2nd digit extension.       GOALS: Goals reviewed with patient? Yes   SHORT TERM GOALS: Target date: 01/27/2023       1. Patient will be independent with home exercise program for the left upper extremity Baseline: 02/01/2023: Pt. Consistently attempts to perform HEPs independently. No current home exercise program Goal status: Ongoing     LONG TERM GOALS: Target date: 03/10/2023     Patient will improve left shoulder strength by 2 mm grades to be able to sustain UE's in elevation long enough to wash her hair.  Baseline: 03/03/2023: Left shoulder flexion: 3-/5, abduction: 3-/5 01/22/2023: Left  shoulder flexion: 3-/5, abduction: 3-/5 Eval: Left shoulder flexion: 3/5, abduction: 3-/5 Goal status: Ongoing   2.  Patient will improve left shoulder active abduction to be able to comb her hair Baseline: 03/03/2023: Shoulder abduction: 85 01/22/2023: Shoulder abduction: 85 Eval: Left shoulder abduction is 80(108) Goal status: Ongoing   3.  Patient will independently button her shirt with modified independence. Baseline: 03/03/2023: Pt. Continues to have difficulty with buttoning. 01/22/2023: Pt. continues to have difficulty. Eval: Patient has difficulty.  Goal status: Ongoing   4.  Patient well improve left grip strength in preparation  for securely holding flowers. Baseline:03/03/2023: left grip strength: 13# 01/22/2023: Left: 12# Eval: Pt. Is unable to securely hold flowers. Goal status: Ongoing   5.  Pt. will independently recall adaptive  strategies for performing ADL tasks including: flossing teeth, donning bra, applying makeup. Baseline: 03/03/2023: Continue 01/22/2023: Pt. continues to benefit from education about adaptive strategies during ADLs, and IADLs. Eval: Pt. to be provided with adaptive strategies. Goal status: Ongoing   6.  Pt. will improve Foto score by 2 points to reflect patient perceived performance improvement assessment specific ADLs  and IADLs Baseline: 1/61/09: FOTO 62 Eval: 57 Goal status: Ongoing  7.  Pt. will improve left hand coordination skills in order to be able to handle, and sort utensils in a drawer.     Baseline: 03/03/23: 1 min. & 39 01/22/2022: Left:  3 min. & 4 sec. Eval: Pt. has difficulty sorting, and placing utensils with the left hand. Left FMC : >5 min. For 7 pegs on the 9 hole peg test.    Goal Status:  Ongoing  8. Pt. will improve active left 2nd digit extension to be able able to isolate her 2nd digit in preparation for pressing/pushing buttons on appliances, phones, or remotes. Baseline: 03/03/23: Pt. Continues to work on improving consistency with  2nd digit extension to press the remote. 01/22/2023: is able to perform full digit extension, although 2nd digit is slow to extend s 2/2 flexor tone. Pt.  Eval: Pt. is able to is unable to actively perform full digit extension Goal status:  Ongoing   ASSESSMENT:   CLINICAL IMPRESSION:  Pt. with no reports of neck pain today.  Pt. required support initially proximally at the left elbow during then UBE, No support was required for the remainder of the task. Pt. required assist to formulate gross, and lateral grasp patterns with the left hand on the EZBoard. The amount of resistance was adjusted for each exercise. Pt. continues to work on improving left shoulder elbow and wrist strength, improving motor control and coordination in order to prepare the left upper extremity and hand for functional reaching, and engaging the left upper extremity when performing hair care, buttoning, securely holding flowers, cutting food, and manipulating/handling/sorting utensils from a drawer for setting a table, as well as adaptive and compensatory strategies during daily ADL and IADL care.  PERFORMANCE DEFICITS in functional skills including ADLs, IADLs, coordination, proprioception, ROM, strength, FMC, and GMC, cognitive skills including memory, and psychosocial skills including coping strategies, environmental adaptation, interpersonal interactions, and routines and behaviors.    IMPAIRMENTS are limiting patient from ADLs, IADLs, education, leisure, and social participation.    COMORBIDITIES may have co-morbidities  that affects occupational performance. Patient will benefit from skilled OT to address above impairments and improve overall function.   MODIFICATION OR ASSISTANCE TO COMPLETE EVALUATION: Min-Moderate modification of tasks or assist with assess necessary to complete an evaluation.   OT OCCUPATIONAL PROFILE AND HISTORY: Detailed assessment: Review of records and additional review of physical, cognitive,  psychosocial history related to current functional performance.   CLINICAL DECISION MAKING: Moderate - several treatment options, min-mod task modification necessary   REHAB POTENTIAL: Good   EVALUATION COMPLEXITY: Moderate      PLAN: OT FREQUENCY: 2x/week   OT DURATION: 12 weeks   PLANNED INTERVENTIONS: self care/ADL training, therapeutic exercise, therapeutic activity, neuromuscular re-education, manual therapy, passive range of motion, functional mobility training, electrical stimulation, and paraffin   RECOMMENDED OTHER SERVICES: PT   CONSULTED AND AGREED WITH PLAN OF CARE: Patient and family member/caregiver   PLAN FOR NEXT SESSION: Initiate OT treatment   Olegario Messier, MS, OTR/L   Olegario Messier, OT 03/19/2023, 5:54 PM

## 2023-03-24 ENCOUNTER — Encounter: Payer: Self-pay | Admitting: Occupational Therapy

## 2023-03-24 ENCOUNTER — Other Ambulatory Visit: Payer: Self-pay | Admitting: Physical Medicine and Rehabilitation

## 2023-03-24 ENCOUNTER — Ambulatory Visit: Payer: Medicare HMO | Admitting: Occupational Therapy

## 2023-03-24 DIAGNOSIS — R531 Weakness: Secondary | ICD-10-CM

## 2023-03-24 DIAGNOSIS — M6281 Muscle weakness (generalized): Secondary | ICD-10-CM

## 2023-03-24 DIAGNOSIS — I6381 Other cerebral infarction due to occlusion or stenosis of small artery: Secondary | ICD-10-CM | POA: Diagnosis not present

## 2023-03-24 DIAGNOSIS — R278 Other lack of coordination: Secondary | ICD-10-CM | POA: Diagnosis not present

## 2023-03-24 NOTE — Therapy (Signed)
Occupational Therapy Treatment Note  Patient Name: [name] MRN: [id number] DOB:1940-12-17, 82 y.o., female Today's Date: 03/24/2023  PCP: Dr. Judithann Sheen REFERRING PROVIDER:  Dr. Judithann Sheen  END OF SESSION:  OT End of Session - 03/24/23 1739     Visit Number 25    Number of Visits 48    Date for OT Re-Evaluation 05/26/23    OT Start Time 1705    OT Stop Time 1738    OT Time Calculation (min) 33 min    Activity Tolerance Patient limited by fatigue    Behavior During Therapy [facility] for tasks assessed/performed               Past Medical History:  Diagnosis Date   Anemia    Arthritis    Asthma    uses inhaler just prior to surgery to avoid attack   Back pain    from previous injury   Complication of anesthesia    has woken  up during 2 different surgery   Depression    no current issue/treatment; situation   Gallstones    GERD (gastroesophageal reflux disease)    Hiatal hernia    patient does NOT have nerve/muscle disease   History of kidney stones    HLD (hyperlipidemia)    HTN (hypertension)    Hypothyroidism    Kidney stones    Knee pain    Nausea and vomiting 10/15/2022   Non-diabetic pancreatic hormone dysfunction years   pt. 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   Pancreatitis    Pneumonia    Seizures    caused by dye injected during a procedure   Shortness of breath    with exertion   Sinus problem    frequent infections/congestion   Stroke 2021   reports having CVA in 2021 and having mini strokes before that   Thyroid disease    Past Surgical History:  Procedure Laterality Date   ABDOMINAL HYSTERECTOMY     APPENDECTOMY     CARPAL TUNNEL RELEASE  10+ years ago   bilateral   EYE SURGERY  3 yrs ago   bilateral cataracts   FOOT OSTEOTOMY  6 weeks ago   Left foot: great, 2nd & 3rd   FOOT OSTEOTOMY  5 years ago   Right great toe   HAND SURGERY Bilateral 2011-most recent   multiple hand surgeries, 2 on left, 3 on right   KNEE ARTHROPLASTY  Right 04/28/2022   Procedure: COMPUTER ASSISTED TOTAL KNEE ARTHROPLASTY;  Surgeon: Donato Heinz, MD;  Location: [facility];  Service: Orthopedics;  Laterality: Right;   LOOP RECORDER INSERTION N/A 05/16/2020   Procedure: LOOP RECORDER INSERTION;  Surgeon: Marcina Millard, MD;  Location: [facility];  Service: Cardiovascular;  Laterality: N/A;   NASAL SINUS SURGERY  most recent 7-8 yrs ago   7 sinus surgeries    TRIGGER FINGER RELEASE  11/19/2011   Procedure: RELEASE TRIGGER FINGER/A-1 PULLEY;  Surgeon: Nicki Reaper, MD;  Location: [facility];  Service: Orthopedics;  Laterality: Right;  release a-1 pulley right index finger and cyst removal   WRIST GANGLION EXCISION  1980's   right   Patient Active Problem List   Diagnosis Date Noted   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 11/27/2022   Hypomagnesemia 11/27/2022   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   HLD (hyperlipidemia) 11/23/2022   Asthma 11/23/2022   Depression with anxiety 11/23/2022   Chronic diastolic CHF (congestive heart failure) 11/23/2022  Normocytic anemia 11/23/2022   Aspiration pneumonia 11/23/2022   AKI (acute kidney injury) 11/23/2022   Abdominal pain 11/23/2022   Mesenteric mass 11/23/2022   Coffee ground emesis 11/23/2022   Nausea and vomiting 10/15/2022   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   Fatigue 09/17/2022   Left hemiparesis 09/17/2022   Right thalamic stroke 08/22/2022   GERD (gastroesophageal reflux disease) 08/21/2022   Agitation 08/20/2022   Acute left-sided weakness 08/20/2022   Expressive aphasia    Stroke 08/19/2022   Leukocytosis 08/19/2022   History of urticaria 04/28/2022   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   Primary osteoarthritis of left knee 02/24/2022   Primary osteoarthritis of right knee 02/24/2022   Lumbar spondylolysis 04/12/2020   History of CVA (cerebrovascular accident) 03/26/2020   Low back pain radiating to right  lower extremity 03/21/2020   B12 deficiency 03/06/2020   Positive anti-CCP test 12/21/2019   Arthralgia 12/13/2019   Dermatitis 12/13/2019   Rheumatoid factor positive 12/13/2019   Essential hypertension 12/11/2018   Palpitations 12/11/2018   Acquired hypothyroidism 11/10/2018   Arthritis of knee 09/17/2016   Anxiety 11/22/2014   Asthma without status asthmaticus 11/22/2014   Benign neoplasm of colon, unspecified 11/22/2014   Environmental allergies 11/22/2014   Hypertriglyceridemia 11/22/2014   Hypokalemia 11/22/2014   Personal history of disease of skin and subcutaneous tissue 11/22/2014   REFERRING DIAG: CVA   THERAPY DIAG:  Muscle weakness (generalized)   Other lack of coordination   Rationale for Evaluation and Treatment Rehabilitation   SUBJECTIVE:   Pt. Called the rehab clinic to inform us that she was running late for her appointment this afternoon.   SUBJECTIVE STATEMENT:   Pt accompanied by: significant other   PERTINENT HISTORY: Patient is an 82 year-old female who was admitted to [facility] on 11/23/2022 with a cerebellar CVA. 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 Pt. Was admitted to Inpatient Rehab from 12/21-1/02/2023. 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  Patient underwent inpatient rehabilitation for 2 weeks.  Patient was assessed and was scheduled for a knee replacement on 08/29/2022 however had to cancel it due to having had a CVA.  Patient had a recent fall 2 days after discharging from inpatient rehab. Past Medical History includes: Knee replacement, essential HTN, hypokalemia, leukocytosis, seizures, positive anti-- CCP test, anxiety disorder, mini strokes.  Patient had shingles with left eye nerve pain s/p 1 year ago.    PRECAUTIONS: Fall   WEIGHT BEARING RESTRICTIONS No   PAIN:  Are you having pain?  No   FALLS: Has patient fallen in last 6 months? Yes. Number  of falls 1   LIVING ENVIRONMENT: Lives with: Lives with Spouse Lives in: House/apartment Stairs: 2 storey home, resides on the first floor.  External: 2 stairs front no rails, and 6 in back with rails Has following equipment at home: Single point cane, Walker - 2 wheeled, Environmental consultant - 4 wheeled, Shower bench, and bed side commode   PLOF: Independent   PATIENT GOALS  To Regain the use of her left arm   OBJECTIVE:    HAND DOMINANCE: Right   ADLs: Overall ADLs: Husband assists pt. as needed Transfers/ambulation related to ADLs:Pt. Uses a 3 wheeled walker with Husband assist. Eating: Pt. Is independent with the right hand. Pt. has difficulty cutting food. Grooming: Pt. is using her right hand, however has difficulty sustaining her LUE in elevation to assist with haircare. UB Dressing: Pt. Is independent donning a  pullover shirt, and button down shirt. Has difficulty with buttoning, LB Dressing:  Independent donning pants, and socks. Difficulty tying shoes. Toileting: Independent Bathing: Pt. Is able to engage her right hand. Tub Shower transfers: Supervision Equipment: See above for equipment     IADLs: Shopping:  Has not had the opportunity for grocery shopping yet Light housekeeping: Husband is assisting with light house keeping Meal Prep:  Dependent Community mobility: Relies of family/friends Medication management: Husband assisting with weekly pillbox set-up, and administering medication. Financial management: TBD Handwriting: 75% legible   MOBILITY STATUS: Hx of falls   POSTURE COMMENTS:  No Significant postural limitations Sitting balance: supported sitting balance WFL   ACTIVITY TOLERANCE: Activity tolerance:  Fatigues in greater than 30 min.    FUNCTIONAL OUTCOME MEASURES: FOTO: 57   UPPER EXTREMITY ROM      Active ROM Right Eval: WFL Left eval Left  01/22/23 Left  03/03/23  Shoulder flexion   132 100 108  Shoulder abduction   80 85 85  Shoulder adduction         Shoulder extension        Shoulder internal rotation        Shoulder external rotation        Elbow flexion   140 140 WFL  Elbow extension   WNL WNL WFL  Wrist flexion   65 68   Wrist extension   -10 20 24   Wrist ulnar deviation     12 10  Wrist radial deviation     8 14  Wrist pronation        Wrist supination        (Blank rows = not tested)   Left digit flexion to [facility]: 2nd: 0cm, 3rd: 0cm, 4th: 0cm, 5th: 0cm   Limited Left full 2nd digit extension     UPPER EXTREMITY MMT:      MMT Right Eval: 4+/5 overall Left Eval Left 01/22/23 Left 03/03/23  Shoulder flexion   3/5 3-/5 3-/5  Shoulder abduction   3-/5 3-/5 3-/5  Shoulder adduction        Shoulder extension        Shoulder internal rotation        Shoulder external rotation        Middle trapezius        Lower trapezius        Elbow flexion   3+/5 4/5 N/A  Elbow extension   3+/5 4/4 N/A  Wrist flexion        Wrist extension   2-/5 3-/5 3-/5  Wrist ulnar deviation        Wrist radial deviation        Wrist pronation        Wrist supination        (Blank rows = not tested)   HAND FUNCTION: Grip strength: Right: 26#, Left: 10# Pinch strength: Right 8#, Left: 3#, 3 Pt. Pinch strength: Right: 9#, L: 2#  01/22/2023 Grip strength: Right: 26#, Left: 12# Pinch strength:  Pinch meter used at the initial eval has been sent out for recalibration   03/03/2023 Grip strength: Right: 26#, Left: 13# Pinch strength:  Pinch meter used at the initial eval has been sent out for recalibration     COORDINATION: Right: 22 sec., Left: <5 min. To place 7 pegs with increased compensation proximally in the trunk, and through reflexive associated reactions.  01/22/23 Right: 22 sec., Left: 3 min. & 4 sec.  03/03/23 Right: 22 sec.,  Left: 1 min. & 39 sec.      SENSATION: Light touch: WFL, proprioceptive awareness: Intact   EDEMA: N/A   MUSCLE TONE: LUE: Hypotonic   COGNITION: Overall cognitive status: WFL for tasks  assessed. Pt. Is impulsive at times.   VISION: Subjective report: Pt. report having shingles affecting left eye  s/p 1 year. Has nerve pain Baseline vision: Wears glasses for reading only Visual history:  updated see clinical impression   VISION ASSESSMENT:    WFL for tasks performed   PERCEPTION: Intact   PRAXIS: Impaired: Motor planning   OBSERVATIONS:  Pt. more alert, and engaging since prior to the most recent hospitalization.     TODAY'S TREATMENT:   Self Care:  Pt reports L shoulder pain from bed transfers, reports rolling onto L shoulder at night and requiring assist from husband to exit bed. Educated on pillow placement to prevent rolling onto affected side. Simulated bed t/f on mat and educated on log roll technique. Completed with MIN cues but no physical assist. Pt reports eager to try at home. Trialed buttoning/unbuttoning shirt with cues to incorporate L hand. Reviewed HEP, plan to include 2nd digit facilitated tapping. B up handle provided to trial self-feeding at home including cutting meat with fork and knife.     PATIENT EDUCATION: Education details: left hand digit extension Person educated: Patient and Spouse Education method: Medical illustrator Education comprehension: verbalized understanding, returned demonstration, and needs further education     HOME EXERCISE PROGRAM:    Reviewed activities at home to promote isolated 2nd digit extension.       GOALS: Goals reviewed with patient? Yes   SHORT TERM GOALS: Target date: 01/27/2023       1. Patient will be independent with home exercise program for the left upper extremity Baseline: 02/01/2023: Pt. Consistently attempts to perform HEPs independently. No current home exercise program Goal status: Ongoing     LONG TERM GOALS: Target date: 03/10/2023     Patient will improve left shoulder strength by 2 mm grades to be able to sustain UE's in elevation long enough to wash her hair.  Baseline:  03/03/2023: Left shoulder flexion: 3-/5, abduction: 3-/5 01/22/2023: Left shoulder flexion: 3-/5, abduction: 3-/5 Eval: Left shoulder flexion: 3/5, abduction: 3-/5 Goal status: Ongoing   2.  Patient will improve left shoulder active abduction to be able to comb her hair Baseline: 03/03/2023: Shoulder abduction: 85 01/22/2023: Shoulder abduction: 85 Eval: Left shoulder abduction is 80(108) Goal status: Ongoing   3.  Patient will independently button her shirt with modified independence. Baseline: 03/03/2023: Pt. Continues to have difficulty with buttoning. 01/22/2023: Pt. continues to have difficulty. Eval: Patient has difficulty.  Goal status: Ongoing   4.  Patient well improve left grip strength in preparation  for securely holding flowers. Baseline:03/03/2023: left grip strength: 13# 01/22/2023: Left: 12# Eval: Pt. Is unable to securely hold flowers. Goal status: Ongoing   5.  Pt. will independently recall adaptive  strategies for performing ADL tasks including: flossing teeth, donning bra, applying makeup. Baseline: 03/03/2023: Continue 01/22/2023: Pt. continues to benefit from education about adaptive strategies during ADLs, and IADLs. Eval: Pt. to be provided with adaptive strategies. Goal status: Ongoing   6.  Pt. will improve Foto score by 2 points to reflect patient perceived performance improvement assessment specific ADLs  and IADLs Baseline: 2/44/01: FOTO 62 Eval: 57 Goal status: Ongoing  7.  Pt. will improve left hand coordination skills in order to be able to handle,  and sort utensils in a drawer.     Baseline: 03/03/23: 1 min. & 39 01/22/2022: Left: 3 min. & 4 sec. Eval: Pt. has difficulty sorting, and placing utensils with the left hand. Left FMC : >5 min. For 7 pegs on the 9 hole peg test.    Goal Status:  Ongoing  8. Pt. will improve active left 2nd digit extension to be able able to isolate her 2nd digit in preparation for pressing/pushing buttons on appliances, phones, or  remotes. Baseline: 03/03/23: Pt. Continues to work on improving consistency with 2nd digit extension to press the remote. 01/22/2023: is able to perform full digit extension, although 2nd digit is slow to extend s 2/2 flexor tone. Pt.  Eval: Pt. is able to is unable to actively perform full digit extension Goal status:  Ongoing   ASSESSMENT:   CLINICAL IMPRESSION:  Pt was late to session today and reported increased L shoulder pain, related to bed mobility. Trialed use of log roll to improve pain and decrease need for caregiver assist. Completed with MIN cues but no physical assist. Completed buttoning with cues to incorporate L hand. Reviewed HEP. Built up handle provided to assist with feeding at home, plan to trial. Pt continues to work on improving L shoulder, elbow, and wrist strength, improving motor control and coordination in order to prepare the left upper extremity and hand for functional reaching and engaging the upper left extremity when performing hair care, buttoning, holding flowers, cutting food, and manipulating utensils to set the table as well as adaptive strategies during daily I/ADL care.   PERFORMANCE DEFICITS in functional skills including ADLs, IADLs, coordination, proprioception, ROM, strength, FMC, and GMC, cognitive skills including memory, and psychosocial skills including coping strategies, environmental adaptation, interpersonal interactions, and routines and behaviors.    IMPAIRMENTS are limiting patient from ADLs, IADLs, education, leisure, and social participation.    COMORBIDITIES may have co-morbidities  that affects occupational performance. Patient will benefit from skilled OT to address above impairments and improve overall function.   MODIFICATION OR ASSISTANCE TO COMPLETE EVALUATION: Min-Moderate modification of tasks or assist with assess necessary to complete an evaluation.   OT OCCUPATIONAL PROFILE AND HISTORY: Detailed assessment: Review of records and  additional review of physical, cognitive, psychosocial history related to current functional performance.   CLINICAL DECISION MAKING: Moderate - several treatment options, min-mod task modification necessary   REHAB POTENTIAL: Good   EVALUATION COMPLEXITY: Moderate      PLAN: OT FREQUENCY: 2x/week   OT DURATION: 12 weeks   PLANNED INTERVENTIONS: self care/ADL training, therapeutic exercise, therapeutic activity, neuromuscular re-education, manual therapy, passive range of motion, functional mobility training, electrical stimulation, and paraffin   RECOMMENDED OTHER SERVICES: PT   CONSULTED AND AGREED WITH PLAN OF CARE: Patient and family member/caregiver   PLAN FOR NEXT SESSION: Initiate OT treatment   Kathie Dike, M.S. OTR/L  03/24/23, 5:40 PM  ascom [phone]   Presley Raddle, OT 03/24/2023, 5:40 PM

## 2023-03-26 ENCOUNTER — Ambulatory Visit: Payer: Medicare HMO | Admitting: Occupational Therapy

## 2023-03-26 ENCOUNTER — Encounter: Payer: Self-pay | Admitting: Occupational Therapy

## 2023-03-26 DIAGNOSIS — M6281 Muscle weakness (generalized): Secondary | ICD-10-CM

## 2023-03-26 DIAGNOSIS — R531 Weakness: Secondary | ICD-10-CM | POA: Diagnosis not present

## 2023-03-26 DIAGNOSIS — R278 Other lack of coordination: Secondary | ICD-10-CM | POA: Diagnosis not present

## 2023-03-26 DIAGNOSIS — I6381 Other cerebral infarction due to occlusion or stenosis of small artery: Secondary | ICD-10-CM | POA: Diagnosis not present

## 2023-03-26 NOTE — Therapy (Signed)
Occupational Therapy Treatment Note  Patient Name: [name] MRN: [id number] DOB:07-28-1941, 82 y.o., female Today's Date: 03/26/2023  PCP: Dr. Judithann Sheen REFERRING PROVIDER:  Dr. Judithann Sheen  END OF SESSION:  OT End of Session - 03/26/23 1601     Visit Number 26    Number of Visits 48    Date for OT Re-Evaluation 05/26/23    OT Start Time 1600 (P)     OT Stop Time 1645 (P)     OT Time Calculation (min) 45 min (P)     Activity Tolerance Patient limited by fatigue    Behavior During Therapy WFL for tasks assessed/performed               Past Medical History:  Diagnosis Date   Anemia    Arthritis    Asthma    uses inhaler just prior to surgery to avoid attack   Back pain    from previous injury   Complication of anesthesia    has woken  up during 2 different surgery   Depression    no current issue/treatment; situation   Gallstones    GERD (gastroesophageal reflux disease)    Hiatal hernia    patient does NOT have nerve/muscle disease   History of kidney stones    HLD (hyperlipidemia)    HTN (hypertension)    Hypothyroidism    Kidney stones    Knee pain    Nausea and vomiting 10/15/2022   Non-diabetic pancreatic hormone dysfunction years   [name]. 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   Pancreatitis    Pneumonia    Seizures    caused by dye injected during a procedure   Shortness of breath    with exertion   Sinus problem    frequent infections/congestion   Stroke 2021   reports having CVA in 2021 and having mini strokes before that   Thyroid disease    Past Surgical History:  Procedure Laterality Date   ABDOMINAL HYSTERECTOMY     APPENDECTOMY     CARPAL TUNNEL RELEASE  10+ years ago   bilateral   EYE SURGERY  3 yrs ago   bilateral cataracts   FOOT OSTEOTOMY  6 weeks ago   Left foot: great, 2nd & 3rd   FOOT OSTEOTOMY  5 years ago   Right great toe   HAND SURGERY Bilateral 2011-most recent   multiple hand surgeries, 2 on left, 3 on right   KNEE  ARTHROPLASTY Right 04/28/2022   Procedure: COMPUTER ASSISTED TOTAL KNEE ARTHROPLASTY;  Surgeon: Donato Heinz, MD;  Location: [facility];  Service: Orthopedics;  Laterality: Right;   LOOP RECORDER INSERTION N/A 05/16/2020   Procedure: LOOP RECORDER INSERTION;  Surgeon: Marcina Millard, MD;  Location: [facility];  Service: Cardiovascular;  Laterality: N/A;   NASAL SINUS SURGERY  most recent 7-8 yrs ago   7 sinus surgeries    TRIGGER FINGER RELEASE  11/19/2011   Procedure: RELEASE TRIGGER FINGER/A-1 PULLEY;  Surgeon: Nicki Reaper, MD;  Location: [facility];  Service: Orthopedics;  Laterality: Right;  release a-1 pulley right index finger and cyst removal   WRIST GANGLION EXCISION  1980's   right   Patient Active Problem List   Diagnosis Date Noted   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 11/27/2022   Hypomagnesemia 11/27/2022   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   HLD (hyperlipidemia) 11/23/2022   Asthma 11/23/2022   Depression with anxiety 11/23/2022   Chronic diastolic  CHF (congestive heart failure) 11/23/2022   Normocytic anemia 11/23/2022   Aspiration pneumonia 11/23/2022   AKI (acute kidney injury) 11/23/2022   Abdominal pain 11/23/2022   Mesenteric mass 11/23/2022   Coffee ground emesis 11/23/2022   Nausea and vomiting 10/15/2022   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   Fatigue 09/17/2022   Left hemiparesis 09/17/2022   Right thalamic stroke 08/22/2022   GERD (gastroesophageal reflux disease) 08/21/2022   Agitation 08/20/2022   Acute left-sided weakness 08/20/2022   Expressive aphasia    Stroke 08/19/2022   Leukocytosis 08/19/2022   History of urticaria 04/28/2022   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   Primary osteoarthritis of left knee 02/24/2022   Primary osteoarthritis of right knee 02/24/2022   Lumbar spondylolysis 04/12/2020   History of CVA (cerebrovascular accident) 03/26/2020   Low back pain radiating  to right lower extremity 03/21/2020   B12 deficiency 03/06/2020   Positive anti-CCP test 12/21/2019   Arthralgia 12/13/2019   Dermatitis 12/13/2019   Rheumatoid factor positive 12/13/2019   Essential hypertension 12/11/2018   Palpitations 12/11/2018   Acquired hypothyroidism 11/10/2018   Arthritis of knee 09/17/2016   Anxiety 11/22/2014   Asthma without status asthmaticus 11/22/2014   Benign neoplasm of colon, unspecified 11/22/2014   Environmental allergies 11/22/2014   Hypertriglyceridemia 11/22/2014   Hypokalemia 11/22/2014   Personal history of disease of skin and subcutaneous tissue 11/22/2014   REFERRING DIAG: CVA   THERAPY DIAG:  Muscle weakness (generalized)   Other lack of coordination   Rationale for Evaluation and Treatment Rehabilitation   SUBJECTIVE:   [name] reports she repeatedly drops her phone.    SUBJECTIVE STATEMENT:   [name] accompanied by: significant other   PERTINENT HISTORY: Patient is an 82 year-old female who was admitted to [facility] on 11/23/2022 with a cerebellar CVA. 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 [name]. Was admitted to Inpatient Rehab from 12/21-1/02/2023. [name] was previously diagnosed with a right thalamic CVA on 08/18/2022 with left-sided weakness.  Patient underwent inpatient rehabilitation for 2 weeks.  Patient was assessed and was scheduled for a knee replacement on 08/29/2022 however had to cancel it due to having had a CVA.  Patient had a recent fall 2 days after discharging from inpatient rehab. Past Medical History includes: Knee replacement, essential HTN, hypokalemia, leukocytosis, seizures, positive anti-- CCP test, anxiety disorder, mini strokes.  Patient had shingles with left eye nerve pain s/p 1 year ago.    PRECAUTIONS: Fall   WEIGHT BEARING RESTRICTIONS No   PAIN:  Are you having pain?  No   FALLS: Has patient fallen in last 6 months? Yes. Number of falls 1   LIVING ENVIRONMENT: Lives with:  Lives with Spouse Lives in: House/apartment Stairs: 2 storey home, resides on the first floor.  External: 2 stairs front no rails, and 6 in back with rails Has following equipment at home: Single point cane, Walker - 2 wheeled, Environmental consultant - 4 wheeled, Shower bench, and bed side commode   PLOF: Independent   PATIENT GOALS  To Regain the use of her left arm   OBJECTIVE:    HAND DOMINANCE: Right   ADLs: Overall ADLs: Husband assists [name]. as needed Transfers/ambulation related to ADLs:[name]. Uses a 3 wheeled walker with Husband assist. Eating: [name]. Is independent with the right hand. [name]. has difficulty cutting food. Grooming: [name]. is using her right hand, however has difficulty sustaining her LUE in elevation to assist with haircare. UB Dressing: [name]. Is independent donning a pullover shirt, and  button down shirt. Has difficulty with buttoning, LB Dressing:  Independent donning pants, and socks. Difficulty tying shoes. Toileting: Independent Bathing: [name]. Is able to engage her right hand. Tub Shower transfers: Supervision Equipment: See above for equipment     IADLs: Shopping:  Has not had the opportunity for grocery shopping yet Light housekeeping: Husband is assisting with light house keeping Meal Prep:  Dependent Community mobility: Relies of family/friends Medication management: Husband assisting with weekly pillbox set-up, and administering medication. Financial management: TBD Handwriting: 75% legible   MOBILITY STATUS: Hx of falls   POSTURE COMMENTS:  No Significant postural limitations Sitting balance: supported sitting balance WFL   ACTIVITY TOLERANCE: Activity tolerance:  Fatigues in greater than 30 min.    FUNCTIONAL OUTCOME MEASURES: FOTO: 57   UPPER EXTREMITY ROM      Active ROM Right Eval: WFL Left eval Left  01/22/23 Left  03/03/23  Shoulder flexion   132 100 108  Shoulder abduction   80 85 85  Shoulder adduction        Shoulder extension        Shoulder  internal rotation        Shoulder external rotation        Elbow flexion   140 140 WFL  Elbow extension   WNL WNL WFL  Wrist flexion   65 68   Wrist extension   - Wrist ulnar deviation     12 10  Wrist radial deviation     8 14  Wrist pronation        Wrist supination        (Blank rows = not tested)   Left digit flexion to [facility]: 2nd: 0cm, 3rd: 0cm, 4th: 0cm, 5th: 0cm   Limited Left full 2nd digit extension     UPPER EXTREMITY MMT:      MMT Right Eval: 4+/5 overall Left Eval Left 01/22/23 Left 03/03/23  Shoulder flexion   3/5 3-/5 3-/5  Shoulder abduction   3-/5 3-/5 3-/5  Shoulder adduction        Shoulder extension        Shoulder internal rotation        Shoulder external rotation        Middle trapezius        Lower trapezius        Elbow flexion   3+/5 4/5 N/A  Elbow extension   3+/5 4/4 N/A  Wrist flexion        Wrist extension   2-/5 3-/5 3-/5  Wrist ulnar deviation        Wrist radial deviation        Wrist pronation        Wrist supination        (Blank rows = not tested)   HAND FUNCTION: Grip strength: Right: 26#, Left: 10# Pinch strength: Right 8#, Left: 3#, 3 [name]. Pinch strength: Right: 9#, L: 2#  01/22/2023 Grip strength: Right: 26#, Left: 12# Pinch strength:  Pinch meter used at the initial eval has been sent out for recalibration   03/03/2023 Grip strength: Right: 26#, Left: 13# Pinch strength:  Pinch meter used at the initial eval has been sent out for recalibration     COORDINATION: Right: 22 sec., Left: <5 min. To place 7 pegs with increased compensation proximally in the trunk, and through reflexive associated reactions.  01/22/23 Right: 22 sec., Left: 3 min. & 4 sec.  03/03/23 Right: 22 sec., Left: 1 min. &  39 sec.      SENSATION: Light touch: WFL, proprioceptive awareness: Intact   EDEMA: N/A   MUSCLE TONE: LUE: Hypotonic   COGNITION: Overall cognitive status: WFL for tasks assessed. [name]. Is impulsive at times.    VISION: Subjective report: [name]. report having shingles affecting left eye  s/p 1 year. Has nerve pain Baseline vision: Wears glasses for reading only Visual history:  updated see clinical impression   VISION ASSESSMENT:    WFL for tasks performed   PERCEPTION: Intact   PRAXIS: Impaired: Motor planning   OBSERVATIONS:  [name]. more alert, and engaging since prior to the most recent hospitalization.     TODAY'S TREATMENT:   Self Care:  [name] reports L shoulder pain from bed transfers, reports rolling onto L shoulder at night and requiring assist from husband to exit bed. Educated on pillow placement to prevent rolling onto affected side. Simulated bed t/f on mat and educated on log roll technique. Completed with MIN cues but no physical assist. [name] reports eager to try at home. Trialed buttoning/unbuttoning shirt with cues to incorporate L hand. Reviewed HEP, plan to include 2nd digit facilitated tapping. B up handle provided to trial self-feeding at home including cutting meat with fork and knife.  Neuromuscular Re-education: [name] completed isolated finger tapping with tactile cues to complete 2nd digit extension, plan to complete at home for HEP. [name] worked on grasping one inch resistive cubes with the board was placed at an incline. [name] alternated between lateral pinch, tip to tip, and hooking the loop on 2nd digit to remove and replace 20 cubes.       PATIENT EDUCATION: Education details: left hand digit extension Person educated: Patient and Spouse Education method: Medical illustrator Education comprehension: verbalized understanding, returned demonstration, and needs further education     HOME EXERCISE PROGRAM:    Reviewed activities at home to promote isolated 2nd digit extension.       GOALS: Goals reviewed with patient? Yes   SHORT TERM GOALS: Target date: 01/27/2023       1. Patient will be independent with home exercise program for the left upper  extremity Baseline: 02/01/2023: [name]. Consistently attempts to perform HEPs independently. No current home exercise program Goal status: Ongoing     LONG TERM GOALS: Target date: 03/10/2023     Patient will improve left shoulder strength by 2 mm grades to be able to sustain UE's in elevation long enough to wash her hair.  Baseline: 03/03/2023: Left shoulder flexion: 3-/5, abduction: 3-/5 01/22/2023: Left shoulder flexion: 3-/5, abduction: 3-/5 Eval: Left shoulder flexion: 3/5, abduction: 3-/5 Goal status: Ongoing   2.  Patient will improve left shoulder active abduction to be able to comb her hair Baseline: 03/03/2023: Shoulder abduction: 85 01/22/2023: Shoulder abduction: 85 Eval: Left shoulder abduction is 80(108) Goal status: Ongoing   3.  Patient will independently button her shirt with modified independence. Baseline: 03/03/2023: [name]. Continues to have difficulty with buttoning. 01/22/2023: [name]. continues to have difficulty. Eval: Patient has difficulty.  Goal status: Ongoing   4.  Patient well improve left grip strength in preparation  for securely holding flowers. Baseline:03/03/2023: left grip strength: 13# 01/22/2023: Left: 12# Eval: [name]. Is unable to securely hold flowers. Goal status: Ongoing   5.  [name]. will independently recall adaptive  strategies for performing ADL tasks including: flossing teeth, donning bra, applying makeup. Baseline: 03/03/2023: Continue 01/22/2023: [name]. continues to benefit from education about adaptive strategies during ADLs, and IADLs. Eval: [name]. to  be provided with adaptive strategies. Goal status: Ongoing   6.  [name]. will improve Foto score by 2 points to reflect patient perceived performance improvement assessment specific ADLs  and IADLs Baseline: 1/61/09: FOTO 62 Eval: 57 Goal status: Ongoing  7.  [name]. will improve left hand coordination skills in order to be able to handle, and sort utensils in a drawer.     Baseline: 03/03/23: 1 min. & 39 01/22/2022: Left: 3 min. &  4 sec. Eval: [name]. has difficulty sorting, and placing utensils with the left hand. Left FMC : >5 min. For 7 pegs on the 9 hole peg test.    Goal Status:  Ongoing  8. [name]. will improve active left 2nd digit extension to be able able to isolate her 2nd digit in preparation for pressing/pushing buttons on appliances, phones, or remotes. Baseline: 03/03/23: [name]. Continues to work on improving consistency with 2nd digit extension to press the remote. 01/22/2023: is able to perform full digit extension, although 2nd digit is slow to extend s 2/2 flexor tone. [name].  Eval: [name]. is able to is unable to actively perform full digit extension Goal status:  Ongoing   ASSESSMENT:   CLINICAL IMPRESSION:  [name] reports plan to complete isolated L digit tapping at home for HEP, requesting to trial e-stim for L 2nd digit extension. [name] reports difficulty managing her phone, trialed use of pop-socket and pillow to support L hand holding phone. Required repeated cues for proprioceptive rest breaks do remove/replace 1" resistive cubes. [name] continues to work on improving L shoulder, elbow, and wrist strength, improving motor control and coordination in order to prepare the left upper extremity and hand for functional reaching and engaging the upper left extremity when performing hair care, buttoning, holding flowers, cutting food, and manipulating utensils to set the table as well as adaptive strategies during daily I/ADL care.   PERFORMANCE DEFICITS in functional skills including ADLs, IADLs, coordination, proprioception, ROM, strength, FMC, and GMC, cognitive skills including memory, and psychosocial skills including coping strategies, environmental adaptation, interpersonal interactions, and routines and behaviors.    IMPAIRMENTS are limiting patient from ADLs, IADLs, education, leisure, and social participation.    COMORBIDITIES may have co-morbidities  that affects occupational performance. Patient will benefit from skilled OT  to address above impairments and improve overall function.   MODIFICATION OR ASSISTANCE TO COMPLETE EVALUATION: Min-Moderate modification of tasks or assist with assess necessary to complete an evaluation.   OT OCCUPATIONAL PROFILE AND HISTORY: Detailed assessment: Review of records and additional review of physical, cognitive, psychosocial history related to current functional performance.   CLINICAL DECISION MAKING: Moderate - several treatment options, min-mod task modification necessary   REHAB POTENTIAL: Good   EVALUATION COMPLEXITY: Moderate      PLAN: OT FREQUENCY: 2x/week   OT DURATION: 12 weeks   PLANNED INTERVENTIONS: self care/ADL training, therapeutic exercise, therapeutic activity, neuromuscular re-education, manual therapy, passive range of motion, functional mobility training, electrical stimulation, and paraffin   RECOMMENDED OTHER SERVICES: [name]   CONSULTED AND AGREED WITH PLAN OF CARE: Patient and family member/caregiver   PLAN FOR NEXT SESSION: Initiate OT treatment   Kathie Dike, M.S. OTR/L  03/26/23, 5:07 PM  ascom [phone]   Presley Raddle, OT 03/26/2023, 5:07 PM

## 2023-03-27 ENCOUNTER — Telehealth: Payer: Self-pay

## 2023-03-27 DIAGNOSIS — R292 Abnormal reflex: Secondary | ICD-10-CM | POA: Diagnosis not present

## 2023-03-27 DIAGNOSIS — I639 Cerebral infarction, unspecified: Secondary | ICD-10-CM | POA: Diagnosis not present

## 2023-03-27 DIAGNOSIS — R0683 Snoring: Secondary | ICD-10-CM | POA: Diagnosis not present

## 2023-03-27 DIAGNOSIS — R1110 Vomiting, unspecified: Secondary | ICD-10-CM | POA: Diagnosis not present

## 2023-03-27 DIAGNOSIS — G8194 Hemiplegia, unspecified affecting left nondominant side: Secondary | ICD-10-CM | POA: Diagnosis not present

## 2023-03-27 MED ORDER — PANTOPRAZOLE SODIUM 40 MG PO TBEC: 40.0000 mg | DELAYED_RELEASE_TABLET | Freq: Every day | ORAL | 0 refills | Status: DC

## 2023-03-27 NOTE — Telephone Encounter (Signed)
Pharmacy states resend prescription for Pantroprazole with frequency in sig. [facility]

## 2023-03-29 ENCOUNTER — Other Ambulatory Visit: Payer: Self-pay | Admitting: Physical Medicine and Rehabilitation

## 2023-03-31 ENCOUNTER — Ambulatory Visit: Payer: Medicare HMO

## 2023-03-31 DIAGNOSIS — R278 Other lack of coordination: Secondary | ICD-10-CM | POA: Diagnosis not present

## 2023-03-31 DIAGNOSIS — I6381 Other cerebral infarction due to occlusion or stenosis of small artery: Secondary | ICD-10-CM | POA: Diagnosis not present

## 2023-03-31 DIAGNOSIS — M6281 Muscle weakness (generalized): Secondary | ICD-10-CM | POA: Diagnosis not present

## 2023-03-31 DIAGNOSIS — R531 Weakness: Secondary | ICD-10-CM | POA: Diagnosis not present

## 2023-04-02 ENCOUNTER — Ambulatory Visit: Payer: Medicare HMO | Admitting: Occupational Therapy

## 2023-04-02 DIAGNOSIS — M6281 Muscle weakness (generalized): Secondary | ICD-10-CM | POA: Diagnosis not present

## 2023-04-02 DIAGNOSIS — R531 Weakness: Secondary | ICD-10-CM | POA: Diagnosis not present

## 2023-04-02 DIAGNOSIS — I6381 Other cerebral infarction due to occlusion or stenosis of small artery: Secondary | ICD-10-CM | POA: Diagnosis not present

## 2023-04-02 DIAGNOSIS — R278 Other lack of coordination: Secondary | ICD-10-CM | POA: Diagnosis not present

## 2023-04-02 NOTE — Therapy (Signed)
Occupational Therapy Treatment Note  Patient Name: [name] MRN: [id number] DOB:[birth year], 82 y.o., female Today's Date: 04/02/2023  PCP: Dr. Judithann Sheen REFERRING PROVIDER:  Dr. Judithann Sheen  END OF SESSION:  OT End of Session - 04/02/23 0813     Visit Number 27    Number of Visits 48    Date for OT Re-Evaluation 05/26/23    Authorization Time Period Progress report period starting 12/16/2022    OT Start Time 1607    OT Stop Time 1647    OT Time Calculation (min) 40 min    Activity Tolerance Patient tolerated treatment well    Behavior During Therapy WFL for tasks assessed/performed             Past Medical History:  Diagnosis Date   Anemia    Arthritis    Asthma    uses inhaler just prior to surgery to avoid attack   Back pain    from previous injury   Complication of anesthesia    has woken  up during 2 different surgery   Depression    no current issue/treatment; situation   Gallstones    GERD (gastroesophageal reflux disease)    Hiatal hernia    patient does NOT have nerve/muscle disease   History of kidney stones    HLD (hyperlipidemia)    HTN (hypertension)    Hypothyroidism    Kidney stones    Knee pain    Nausea and vomiting 10/15/2022   Non-diabetic pancreatic hormone dysfunction years   pt. 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   Pancreatitis    Pneumonia    Seizures    caused by dye injected during a procedure   Shortness of breath    with exertion   Sinus problem    frequent infections/congestion   Stroke 2021   reports having CVA in 2021 and having mini strokes before that   Thyroid disease    Past Surgical History:  Procedure Laterality Date   ABDOMINAL HYSTERECTOMY     APPENDECTOMY     CARPAL TUNNEL RELEASE  10+ years ago   bilateral   EYE SURGERY  3 yrs ago   bilateral cataracts   FOOT OSTEOTOMY  6 weeks ago   Left foot: great, 2nd & 3rd   FOOT OSTEOTOMY  5 years ago   Right great toe   HAND SURGERY Bilateral 2011-most recent    multiple hand surgeries, 2 on left, 3 on right   KNEE ARTHROPLASTY Right 04/28/2022   Procedure: COMPUTER ASSISTED TOTAL KNEE ARTHROPLASTY;  Surgeon: Donato Heinz, MD;  Location: [facility];  Service: Orthopedics;  Laterality: Right;   LOOP RECORDER INSERTION N/A 05/16/2020   Procedure: LOOP RECORDER INSERTION;  Surgeon: Marcina Millard, MD;  Location: [facility];  Service: Cardiovascular;  Laterality: N/A;   NASAL SINUS SURGERY  most recent 7-8 yrs ago   7 sinus surgeries    TRIGGER FINGER RELEASE  11/19/2011   Procedure: RELEASE TRIGGER FINGER/A-1 PULLEY;  Surgeon: Nicki Reaper, MD;  Location: [facility];  Service: Orthopedics;  Laterality: Right;  release a-1 pulley right index finger and cyst removal   WRIST GANGLION EXCISION  1980's   right   Patient Active Problem List   Diagnosis Date Noted   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 11/27/2022   Hypomagnesemia 11/27/2022   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   HLD (hyperlipidemia) 11/23/2022   Asthma 11/23/2022   Depression with anxiety 11/23/2022  Chronic diastolic CHF (congestive heart failure) 11/23/2022   Normocytic anemia 11/23/2022   Aspiration pneumonia 11/23/2022   AKI (acute kidney injury) 11/23/2022   Abdominal pain 11/23/2022   Mesenteric mass 11/23/2022   Coffee ground emesis 11/23/2022   Nausea and vomiting 10/15/2022   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   Fatigue 09/17/2022   Left hemiparesis 09/17/2022   Right thalamic stroke 08/22/2022   GERD (gastroesophageal reflux disease) 08/21/2022   Agitation 08/20/2022   Acute left-sided weakness 08/20/2022   Expressive aphasia    Stroke 08/19/2022   Leukocytosis 08/19/2022   History of urticaria 04/28/2022   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   Primary osteoarthritis of left knee 02/24/2022   Primary osteoarthritis of right knee 02/24/2022   Lumbar spondylolysis 04/12/2020   History of CVA  (cerebrovascular accident) 03/26/2020   Low back pain radiating to right lower extremity 03/21/2020   B12 deficiency 03/06/2020   Positive anti-CCP test 12/21/2019   Arthralgia 12/13/2019   Dermatitis 12/13/2019   Rheumatoid factor positive 12/13/2019   Essential hypertension 12/11/2018   Palpitations 12/11/2018   Acquired hypothyroidism 11/10/2018   Arthritis of knee 09/17/2016   Anxiety 11/22/2014   Asthma without status asthmaticus 11/22/2014   Benign neoplasm of colon, unspecified 11/22/2014   Environmental allergies 11/22/2014   Hypertriglyceridemia 11/22/2014   Hypokalemia 11/22/2014   Personal history of disease of skin and subcutaneous tissue 11/22/2014   REFERRING DIAG: CVA   THERAPY DIAG:  Muscle weakness (generalized)   Other lack of coordination   Rationale for Evaluation and Treatment Rehabilitation   SUBJECTIVE:  Pt reports doing well today.   SUBJECTIVE STATEMENT:   Pt accompanied by: significant other   PERTINENT HISTORY: Patient is an 82 year-old female who was [facility] on [facility]22 with a cerebellar CVA. 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 Pt. Was admitted to Inpatient Rehab from 12/21-1/02/2023. 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  Patient underwent inpatient rehabilitation for 2 weeks.  Patient was assessed and was scheduled for a knee replacement on 08/29/2022 however had to cancel it due to having had a CVA.  Patient had a recent fall 2 days after discharging from inpatient rehab. Past Medical History includes: Knee replacement, essential HTN, hypokalemia, leukocytosis, seizures, positive anti-- CCP test, anxiety disorder, mini strokes.  Patient had shingles with left eye nerve pain s/p 1 year ago.    PRECAUTIONS: Fall   WEIGHT BEARING RESTRICTIONS No   PAIN:  Are you having pain?  No   FALLS: Has patient fallen in last 6 months? Yes. Number  of falls 1   LIVING ENVIRONMENT: Lives with: Lives with Spouse Lives in: House/apartment Stairs: 2 storey home, resides on the first floor.  External: 2 stairs front no rails, and 6 in back with rails Has following equipment at home: Single point cane, Walker - 2 wheeled, Environmental consultant - 4 wheeled, Shower bench, and bed side commode   PLOF: Independent   PATIENT GOALS  To Regain the use of her left arm   OBJECTIVE:    HAND DOMINANCE: Right   ADLs: Overall ADLs: Husband assists pt. as needed Transfers/ambulation related to ADLs:Pt. Uses a 3 wheeled walker with Husband assist. Eating: Pt. Is independent with the right hand. Pt. has difficulty cutting food. Grooming: Pt. is using her right hand, however has difficulty sustaining her LUE in elevation to assist with haircare. UB Dressing: Pt. Is independent donning a pullover shirt, and button down  shirt. Has difficulty with buttoning, LB Dressing:  Independent donning pants, and socks. Difficulty tying shoes. Toileting: Independent Bathing: Pt. Is able to engage her right hand. Tub Shower transfers: Supervision Equipment: See above for equipment     IADLs: Shopping:  Has not had the opportunity for grocery shopping yet Light housekeeping: Husband is assisting with light house keeping Meal Prep:  Dependent Community mobility: Relies of family/friends Medication management: Husband assisting with weekly pillbox set-up, and administering medication. Financial management: TBD Handwriting: 75% legible   MOBILITY STATUS: Hx of falls   POSTURE COMMENTS:  No Significant postural limitations Sitting balance: supported sitting balance WFL   ACTIVITY TOLERANCE: Activity tolerance:  Fatigues in greater than 30 min.    FUNCTIONAL OUTCOME MEASURES: FOTO: 57   UPPER EXTREMITY ROM      Active ROM Right Eval: WFL Left eval Left  01/22/23 Left  03/03/23  Shoulder flexion   132 100 108  Shoulder abduction   80 85 85  Shoulder adduction         Shoulder extension        Shoulder internal rotation        Shoulder external rotation        Elbow flexion   140 140 WFL  Elbow extension   WNL WNL WFL  Wrist flexion   65 68   Wrist extension   -10 20 24   Wrist ulnar deviation     12 10  Wrist radial deviation     8 14  Wrist pronation        Wrist supination        (Blank rows = not tested)   Left digit flexion to [facility]: 2nd: 0cm, 3rd: 0cm, 4th: 0cm, 5th: 0cm   Limited Left full 2nd digit extension     UPPER EXTREMITY MMT:      MMT Right Eval: 4+/5 overall Left Eval Left 01/22/23 Left 03/03/23  Shoulder flexion   3/5 3-/5 3-/5  Shoulder abduction   3-/5 3-/5 3-/5  Shoulder adduction        Shoulder extension        Shoulder internal rotation        Shoulder external rotation        Middle trapezius        Lower trapezius        Elbow flexion   3+/5 4/5 N/A  Elbow extension   3+/5 4/4 N/A  Wrist flexion        Wrist extension   2-/5 3-/5 3-/5  Wrist ulnar deviation        Wrist radial deviation        Wrist pronation        Wrist supination        (Blank rows = not tested)   HAND FUNCTION: Grip strength: Right: 26#, Left: 10# Pinch strength: Right 8#, Left: 3#, 3 Pt. Pinch strength: Right: 9#, L: 2#  01/22/2023 Grip strength: Right: 26#, Left: 12# Pinch strength:  Pinch meter used at the initial eval has been sent out for recalibration   03/03/2023 Grip strength: Right: 26#, Left: 13# Pinch strength:  Pinch meter used at the initial eval has been sent out for recalibration     COORDINATION: Right: 22 sec., Left: <5 min. To place 7 pegs with increased compensation proximally in the trunk, and through reflexive associated reactions.  01/22/23 Right: 22 sec., Left: 3 min. & 4 sec.  03/03/23 Right: 22 sec., Left: 1 min. & 39  sec.   SENSATION: Light touch: WFL, proprioceptive awareness: Intact   EDEMA: N/A   MUSCLE TONE: LUE: Hypotonic   COGNITION: Overall cognitive status: WFL for tasks assessed. Pt.  Is impulsive at times.   VISION: Subjective report: Pt. report having shingles affecting left eye  s/p 1 year. Has nerve pain Baseline vision: Wears glasses for reading only Visual history:  updated see clinical impression   VISION ASSESSMENT:    WFL for tasks performed   PERCEPTION: Intact   PRAXIS: Impaired: Motor planning   OBSERVATIONS:  Pt. more alert, and engaging since prior to the most recent hospitalization.     TODAY'S TREATMENT:  Therapeutic Exercise: Pt participated in [facility] for all L shoulder planes for 2 sets 10 reps each, working to increase strength and flexibility for UB ADLs.  Performed all within pain free range.  Facilitated L grip strengthening with hand gripper set at min resistance with 1 red band to complete 3 sets 10 reps.  Rest between sets and cues for technique to maximize hand closure with each squeeze.  Facilitated pinch strengthening with use of therapy resistant clothespins to target lateral pinch on the L hand.  Able to pinch yellow, red (light resistance) with set up assist to facilitate correct pinch pattern.  Pt practiced forward and lateral reaching patterns to place clips back into container with cues for elbow ext, shoulder flex and abd keeping back against back rest of chair to minimize compensatory leaning.  Pt required intermittent passive L digit and wrist extension stretching during rest breaks to reduce spasticity in the L hand.  Practiced pen push/roll with L hand fingers on table top to facilitate digit ext.      PATIENT EDUCATION: Education details: left hand digit extension Person educated: Patient and Spouse Education method: Medical illustrator Education comprehension: verbalized understanding, returned demonstration, and needs further education     HOME EXERCISE PROGRAM:    Reviewed activities at home to promote isolated 2nd digit extension.    GOALS: Goals reviewed with patient? Yes   SHORT TERM GOALS: Target date:  01/27/2023       1. Patient will be independent with home exercise program for the left upper extremity Baseline: 02/01/2023: Pt. Consistently attempts to perform HEPs independently. No current home exercise program Goal status: Ongoing     LONG TERM GOALS: Target date: 03/10/2023     Patient will improve left shoulder strength by 2 mm grades to be able to sustain UE's in elevation long enough to wash her hair.  Baseline: 03/03/2023: Left shoulder flexion: 3-/5, abduction: 3-/5 01/22/2023: Left shoulder flexion: 3-/5, abduction: 3-/5 Eval: Left shoulder flexion: 3/5, abduction: 3-/5 Goal status: Ongoing   2.  Patient will improve left shoulder active abduction to be able to comb her hair Baseline: 03/03/2023: Shoulder abduction: 85 01/22/2023: Shoulder abduction: 85 Eval: Left shoulder abduction is 80(108) Goal status: Ongoing   3.  Patient will independently button her shirt with modified independence. Baseline: 03/03/2023: Pt. Continues to have difficulty with buttoning. 01/22/2023: Pt. continues to have difficulty. Eval: Patient has difficulty.  Goal status: Ongoing   4.  Patient well improve left grip strength in preparation  for securely holding flowers. Baseline:03/03/2023: left grip strength: 13# 01/22/2023: Left: 12# Eval: Pt. Is unable to securely hold flowers. Goal status: Ongoing   5.  Pt. will independently recall adaptive  strategies for performing ADL tasks including: flossing teeth, donning bra, applying makeup. Baseline: 03/03/2023: Continue 01/22/2023: Pt. continues to benefit from education  about adaptive strategies during ADLs, and IADLs. Eval: Pt. to be provided with adaptive strategies. Goal status: Ongoing   6.  Pt. will improve Foto score by 2 points to reflect patient perceived performance improvement assessment specific ADLs  and IADLs Baseline: 1/61/09: FOTO 62 Eval: 57 Goal status: Ongoing  7.  Pt. will improve left hand coordination skills in order to be able to  handle, and sort utensils in a drawer.     Baseline: 03/03/23: 1 min. & 39 01/22/2022: Left: 3 min. & 4 sec. Eval: Pt. has difficulty sorting, and placing utensils with the left hand. Left FMC : >5 min. For 7 pegs on the 9 hole peg test.    Goal Status:  Ongoing  8. Pt. will improve active left 2nd digit extension to be able able to isolate her 2nd digit in preparation for pressing/pushing buttons on appliances, phones, or remotes. Baseline: 03/03/23: Pt. Continues to work on improving consistency with 2nd digit extension to press the remote. 01/22/2023: is able to perform full digit extension, although 2nd digit is slow to extend s 2/2 flexor tone. Pt.  Eval: Pt. is able to is unable to actively perform full digit extension Goal status:  Ongoing   ASSESSMENT:   CLINICAL IMPRESSION: Good tolerance to LUE therapeutic exercises today with frequent rest breaks, vc and tactile cues for form to minimize compensatory movement patterns.  Reviewed HEP additions, including finger taps and pushing a pen forward on table top to facilitate L hand digit extension.  Pt continues to work on improving L shoulder, elbow, and wrist strength, improving motor control and coordination in order to prepare the left upper extremity and hand for functional reaching and engaging the upper left extremity when performing hair care, buttoning, holding flowers, cutting food, and manipulating utensils to set the table as well as adaptive strategies during daily I/ADL care.   PERFORMANCE DEFICITS in functional skills including ADLs, IADLs, coordination, proprioception, ROM, strength, FMC, and GMC, cognitive skills including memory, and psychosocial skills including coping strategies, environmental adaptation, interpersonal interactions, and routines and behaviors.    IMPAIRMENTS are limiting patient from ADLs, IADLs, education, leisure, and social participation.    COMORBIDITIES may have co-morbidities  that affects occupational  performance. Patient will benefit from skilled OT to address above impairments and improve overall function.   MODIFICATION OR ASSISTANCE TO COMPLETE EVALUATION: Min-Moderate modification of tasks or assist with assess necessary to complete an evaluation.   OT OCCUPATIONAL PROFILE AND HISTORY: Detailed assessment: Review of records and additional review of physical, cognitive, psychosocial history related to current functional performance.   CLINICAL DECISION MAKING: Moderate - several treatment options, min-mod task modification necessary   REHAB POTENTIAL: Good   EVALUATION COMPLEXITY: Moderate      PLAN: OT FREQUENCY: 2x/week   OT DURATION: 12 weeks   PLANNED INTERVENTIONS: self care/ADL training, therapeutic exercise, therapeutic activity, neuromuscular re-education, manual therapy, passive range of motion, functional mobility training, electrical stimulation, and paraffin   RECOMMENDED OTHER SERVICES: PT   CONSULTED AND AGREED WITH PLAN OF CARE: Patient and family member/caregiver   PLAN FOR NEXT SESSION: Initiate OT treatment  Danelle Earthly, MS, OTR/L   Otis Dials, OT 04/02/2023, 8:22 AM

## 2023-04-02 NOTE — Therapy (Addendum)
Occupational Therapy Treatment Note  Patient Name: [name] MRN: [id number] DOB:1941-04-30, 82 y.o., female Today's Date: 04/02/2023  PCP: Dr. Judithann Sheen REFERRING PROVIDER:  Dr. Judithann Sheen  END OF SESSION:  OT End of Session - 04/02/23 0813     Visit Number 27    Number of Visits 48    Date for OT Re-Evaluation 05/26/23    Authorization Time Period Progress report period starting 12/16/2022    OT Start Time 1607    OT Stop Time 1647    OT Time Calculation (min) 40 min    Activity Tolerance Patient tolerated treatment well    Behavior During Therapy WFL for tasks assessed/performed             Past Medical History:  Diagnosis Date   Anemia    Arthritis    Asthma    uses inhaler just prior to surgery to avoid attack   Back pain    from previous injury   Complication of anesthesia    has woken  up during 2 different surgery   Depression    no current issue/treatment; situation   Gallstones    GERD (gastroesophageal reflux disease)    Hiatal hernia    patient does NOT have nerve/muscle disease   History of kidney stones    HLD (hyperlipidemia)    HTN (hypertension)    Hypothyroidism    Kidney stones    Knee pain    Nausea and vomiting 10/15/2022   Non-diabetic pancreatic hormone dysfunction years   pt. 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   Pancreatitis    Pneumonia    Seizures    caused by dye injected during a procedure   Shortness of breath    with exertion   Sinus problem    frequent infections/congestion   Stroke 2021   reports having CVA in 2021 and having mini strokes before that   Thyroid disease    Past Surgical History:  Procedure Laterality Date   ABDOMINAL HYSTERECTOMY     APPENDECTOMY     CARPAL TUNNEL RELEASE  10+ years ago   bilateral   EYE SURGERY  3 yrs ago   bilateral cataracts   FOOT OSTEOTOMY  6 weeks ago   Left foot: great, 2nd & 3rd   FOOT OSTEOTOMY  5 years ago   Right great toe   HAND SURGERY Bilateral 2011-most recent    multiple hand surgeries, 2 on left, 3 on right   KNEE ARTHROPLASTY Right 04/28/2022   Procedure: COMPUTER ASSISTED TOTAL KNEE ARTHROPLASTY;  Surgeon: Donato Heinz, MD;  Location: [facility];  Service: Orthopedics;  Laterality: Right;   LOOP RECORDER INSERTION N/A 05/16/2020   Procedure: LOOP RECORDER INSERTION;  Surgeon: Marcina Millard, MD;  Location: [facility];  Service: Cardiovascular;  Laterality: N/A;   NASAL SINUS SURGERY  most recent 7-8 yrs ago   7 sinus surgeries    TRIGGER FINGER RELEASE  11/19/2011   Procedure: RELEASE TRIGGER FINGER/A-1 PULLEY;  Surgeon: Nicki Reaper, MD;  Location: [facility];  Service: Orthopedics;  Laterality: Right;  release a-1 pulley right index finger and cyst removal   WRIST GANGLION EXCISION  1980's   right   Patient Active Problem List   Diagnosis Date Noted   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 11/27/2022   Hypomagnesemia 11/27/2022   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   HLD (hyperlipidemia) 11/23/2022   Asthma 11/23/2022   Depression with anxiety 11/23/2022  Chronic diastolic CHF (congestive heart failure) 11/23/2022   Normocytic anemia 11/23/2022   Aspiration pneumonia 11/23/2022   AKI (acute kidney injury) 11/23/2022   Abdominal pain 11/23/2022   Mesenteric mass 11/23/2022   Coffee ground emesis 11/23/2022   Nausea and vomiting 10/15/2022   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   Fatigue 09/17/2022   Left hemiparesis 09/17/2022   Right thalamic stroke 08/22/2022   GERD (gastroesophageal reflux disease) 08/21/2022   Agitation 08/20/2022   Acute left-sided weakness 08/20/2022   Expressive aphasia    Stroke 08/19/2022   Leukocytosis 08/19/2022   History of urticaria 04/28/2022   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   Primary osteoarthritis of left knee 02/24/2022   Primary osteoarthritis of right knee 02/24/2022   Lumbar spondylolysis 04/12/2020   History of CVA  (cerebrovascular accident) 03/26/2020   Low back pain radiating to right lower extremity 03/21/2020   B12 deficiency 03/06/2020   Positive anti-CCP test 12/21/2019   Arthralgia 12/13/2019   Dermatitis 12/13/2019   Rheumatoid factor positive 12/13/2019   Essential hypertension 12/11/2018   Palpitations 12/11/2018   Acquired hypothyroidism 11/10/2018   Arthritis of knee 09/17/2016   Anxiety 11/22/2014   Asthma without status asthmaticus 11/22/2014   Benign neoplasm of colon, unspecified 11/22/2014   Environmental allergies 11/22/2014   Hypertriglyceridemia 11/22/2014   Hypokalemia 11/22/2014   Personal history of disease of skin and subcutaneous tissue 11/22/2014   REFERRING DIAG: CVA   THERAPY DIAG:  Muscle weakness (generalized)   Other lack of coordination   Rationale for Evaluation and Treatment Rehabilitation   SUBJECTIVE:  Pt reports  that she didn't take a nap today.   SUBJECTIVE STATEMENT:   Pt accompanied by: significant other   PERTINENT HISTORY: Patient is an 82 year-old female who was admitted to [facility] on 11/23/2022 with a cerebellar CVA. 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 Pt. Was admitted to Inpatient Rehab from 12/21-1/02/2023. 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  Patient underwent inpatient rehabilitation for 2 weeks.  Patient was assessed and was scheduled for a knee replacement on 08/29/2022 however had to cancel it due to having had a CVA.  Patient had a recent fall 2 days after discharging from inpatient rehab. Past Medical History includes: Knee replacement, essential HTN, hypokalemia, leukocytosis, seizures, positive anti-- CCP test, anxiety disorder, mini strokes.  Patient had shingles with left eye nerve pain s/p 1 year ago.    PRECAUTIONS: Fall   WEIGHT BEARING RESTRICTIONS No   PAIN:  Are you having pain?  2/10   FALLS: Has patient fallen in last 6  months? Yes. Number of falls 1   LIVING ENVIRONMENT: Lives with: Lives with Spouse Lives in: House/apartment Stairs: 2 storey home, resides on the first floor.  External: 2 stairs front no rails, and 6 in back with rails Has following equipment at home: Single point cane, Walker - 2 wheeled, Environmental consultant - 4 wheeled, Shower bench, and bed side commode   PLOF: Independent   PATIENT GOALS  To Regain the use of her left arm   OBJECTIVE:    HAND DOMINANCE: Right   ADLs: Overall ADLs: Husband assists pt. as needed Transfers/ambulation related to ADLs:Pt. Uses a 3 wheeled walker with Husband assist. Eating: Pt. Is independent with the right hand. Pt. has difficulty cutting food. Grooming: Pt. is using her right hand, however has difficulty sustaining her LUE in elevation to assist with haircare. UB Dressing: Pt. Is independent donning a  pullover shirt, and button down shirt. Has difficulty with buttoning, LB Dressing:  Independent donning pants, and socks. Difficulty tying shoes. Toileting: Independent Bathing: Pt. Is able to engage her right hand. Tub Shower transfers: Supervision Equipment: See above for equipment     IADLs: Shopping:  Has not had the opportunity for grocery shopping yet Light housekeeping: Husband is assisting with light house keeping Meal Prep:  Dependent Community mobility: Relies of family/friends Medication management: Husband assisting with weekly pillbox set-up, and administering medication. Financial management: TBD Handwriting: 75% legible   MOBILITY STATUS: Hx of falls   POSTURE COMMENTS:  No Significant postural limitations Sitting balance: supported sitting balance WFL   ACTIVITY TOLERANCE: Activity tolerance:  Fatigues in greater than 30 min.    FUNCTIONAL OUTCOME MEASURES: FOTO: 57   UPPER EXTREMITY ROM      Active ROM Right Eval: WFL Left eval Left  01/22/23 Left  03/03/23  Shoulder flexion   132 100 108  Shoulder abduction   80 85 85   Shoulder adduction        Shoulder extension        Shoulder internal rotation        Shoulder external rotation        Elbow flexion   140 140 WFL  Elbow extension   WNL WNL WFL  Wrist flexion   65 68   Wrist extension   - Wrist ulnar deviation     12 10  Wrist radial deviation     8 14  Wrist pronation        Wrist supination        (Blank rows = not tested)   Left digit flexion to [facility]: 2nd: 0cm, 3rd: 0cm, 4th: 0cm, 5th: 0cm   Limited Left full 2nd digit extension     UPPER EXTREMITY MMT:      MMT Right Eval: 4+/5 overall Left Eval Left 01/22/23 Left 03/03/23  Shoulder flexion   3/5 3-/5 3-/5  Shoulder abduction   3-/5 3-/5 3-/5  Shoulder adduction        Shoulder extension        Shoulder internal rotation        Shoulder external rotation        Middle trapezius        Lower trapezius        Elbow flexion   3+/5 4/5 N/A  Elbow extension   3+/5 4/4 N/A  Wrist flexion        Wrist extension   2-/5 3-/5 3-/5  Wrist ulnar deviation        Wrist radial deviation        Wrist pronation        Wrist supination        (Blank rows = not tested)   HAND FUNCTION: Grip strength: Right: 26#, Left: 10# Pinch strength: Right 8#, Left: 3#, 3 Pt. Pinch strength: Right: 9#, L: 2#  01/22/2023 Grip strength: Right: 26#, Left: 12# Pinch strength:  Pinch meter used at the initial eval has been sent out for recalibration   03/03/2023 Grip strength: Right: 26#, Left: 13# Pinch strength:  Pinch meter used at the initial eval has been sent out for recalibration     COORDINATION: Right: 22 sec., Left: <5 min. To place 7 pegs with increased compensation proximally in the trunk, and through reflexive associated reactions.  01/22/23 Right: 22 sec., Left: 3 min. & 4 sec.  03/03/23 Right: 22 sec.,  Left: 1 min. & 39 sec.   SENSATION: Light touch: WFL, proprioceptive awareness: Intact   EDEMA: N/A   MUSCLE TONE: LUE: Hypotonic   COGNITION: Overall cognitive status:  WFL for tasks assessed. Pt. Is impulsive at times.   VISION: Subjective report: Pt. report having shingles affecting left eye  s/p 1 year. Has nerve pain Baseline vision: Wears glasses for reading only Visual history:  updated see clinical impression   VISION ASSESSMENT:    WFL for tasks performed   PERCEPTION: Intact   PRAXIS: Impaired: Motor planning   OBSERVATIONS:  Pt. more alert, and engaging since prior to the most recent hospitalization.     TODAY'S TREATMENT:  Therapeutic Exercise:  Therapeutic Ex:   Pt. tolerated AROM/AAROM for the LUE. Pt. worked on [employer], and reciprocal motion using the UBE while seated for 8 min. with no resistance. Pt. Performed reps on 2nd digit extension at the tabletop.  Neuro-muscular re-education:  Pt. worked on left hand [facility] skills grasping, flipping, and stacking [place] style discs. Pt. worked on grasping 1" resistive cubes alternating thumb opposition to the tip of the 2nd digit while the board is placed at a vertical angle. Pt. worked on pressing the cubes back into place while alternating isolated 2nd digit extension.       PATIENT EDUCATION: Education details: left hand digit extension Person educated: Patient and Spouse Education method: Medical illustrator Education comprehension: verbalized understanding, returned demonstration, and needs further education     HOME EXERCISE PROGRAM:    Reviewed activities at home to promote isolated 2nd digit extension.    GOALS: Goals reviewed with patient? Yes   SHORT TERM GOALS: Target date: 01/27/2023       1. Patient will be independent with home exercise program for the left upper extremity Baseline: 02/01/2023: Pt. Consistently attempts to perform HEPs independently. No current home exercise program Goal status: Ongoing     LONG TERM GOALS: Target date: 03/10/2023     Patient will improve left shoulder strength by 2 mm grades to be able to sustain UE's in  elevation long enough to wash her hair.  Baseline: 03/03/2023: Left shoulder flexion: 3-/5, abduction: 3-/5 01/22/2023: Left shoulder flexion: 3-/5, abduction: 3-/5 Eval: Left shoulder flexion: 3/5, abduction: 3-/5 Goal status: Ongoing   2.  Patient will improve left shoulder active abduction to be able to comb her hair Baseline: 03/03/2023: Shoulder abduction: 85 01/22/2023: Shoulder abduction: 85 Eval: Left shoulder abduction is 80(108) Goal status: Ongoing   3.  Patient will independently button her shirt with modified independence. Baseline: 03/03/2023: Pt. Continues to have difficulty with buttoning. 01/22/2023: Pt. continues to have difficulty. Eval: Patient has difficulty.  Goal status: Ongoing   4.  Patient well improve left grip strength in preparation  for securely holding flowers. Baseline:03/03/2023: left grip strength: 13# 01/22/2023: Left: 12# Eval: Pt. Is unable to securely hold flowers. Goal status: Ongoing   5.  Pt. will independently recall adaptive  strategies for performing ADL tasks including: flossing teeth, donning bra, applying makeup. Baseline: 03/03/2023: Continue 01/22/2023: Pt. continues to benefit from education about adaptive strategies during ADLs, and IADLs. Eval: Pt. to be provided with adaptive strategies. Goal status: Ongoing   6.  Pt. will improve Foto score by 2 points to reflect patient perceived performance improvement assessment specific ADLs  and IADLs Baseline: 1/61/09: FOTO 62 Eval: 57 Goal status: Ongoing  7.  Pt. will improve left hand coordination skills in order to be able to handle,  and sort utensils in a drawer.     Baseline: 03/03/23: 1 min. & 39 01/22/2022: Left: 3 min. & 4 sec. Eval: Pt. has difficulty sorting, and placing utensils with the left hand. Left FMC : >5 min. For 7 pegs on the 9 hole peg test.    Goal Status:  Ongoing  8. Pt. will improve active left 2nd digit extension to be able able to isolate her 2nd digit in preparation for  pressing/pushing buttons on appliances, phones, or remotes. Baseline: 03/03/23: Pt. Continues to work on improving consistency with 2nd digit extension to press the remote. 01/22/2023: is able to perform full digit extension, although 2nd digit is slow to extend s 2/2 flexor tone. Pt.  Eval: Pt. is able to is unable to actively perform full digit extension Goal status:  Ongoing   ASSESSMENT:   CLINICAL IMPRESSION:   Pt. continues to present with good tolerance for exercises today. Pt. presents with difficulty flipping the [place] style discs within her fingers. Pt. was able to stack 10 [place] discs in a tower, and remove them. Pt. requires cue, and assist for hand position, and technique. Pt continues to work on improving L shoulder, elbow, and wrist strength, improving motor control and coordination in order to prepare the left upper extremity and hand for functional reaching and engaging the upper left extremity when performing hair care, buttoning, holding flowers, cutting food, and manipulating utensils to set the table as well as adaptive strategies during daily I/ADL care.   PERFORMANCE DEFICITS in functional skills including ADLs, IADLs, coordination, proprioception, ROM, strength, FMC, and GMC, cognitive skills including memory, and psychosocial skills including coping strategies, environmental adaptation, interpersonal interactions, and routines and behaviors.    IMPAIRMENTS are limiting patient from ADLs, IADLs, education, leisure, and social participation.    COMORBIDITIES may have co-morbidities  that affects occupational performance. Patient will benefit from skilled OT to address above impairments and improve overall function.   MODIFICATION OR ASSISTANCE TO COMPLETE EVALUATION: Min-Moderate modification of tasks or assist with assess necessary to complete an evaluation.   OT OCCUPATIONAL PROFILE AND HISTORY: Detailed assessment: Review of records and additional review of  physical, cognitive, psychosocial history related to current functional performance.   CLINICAL DECISION MAKING: Moderate - several treatment options, min-mod task modification necessary   REHAB POTENTIAL: Good   EVALUATION COMPLEXITY: Moderate      PLAN: OT FREQUENCY: 2x/week   OT DURATION: 12 weeks   PLANNED INTERVENTIONS: self care/ADL training, therapeutic exercise, therapeutic activity, neuromuscular re-education, manual therapy, passive range of motion, functional mobility training, electrical stimulation, and paraffin   RECOMMENDED OTHER SERVICES: PT   CONSULTED AND AGREED WITH PLAN OF CARE: Patient and family member/caregiver   PLAN FOR NEXT SESSION: Initiate OT treatment  Olegario Messier, MS, OTR/L   04/02/2023

## 2023-04-07 ENCOUNTER — Ambulatory Visit: Payer: Medicare HMO | Admitting: Occupational Therapy

## 2023-04-07 DIAGNOSIS — R531 Weakness: Secondary | ICD-10-CM | POA: Diagnosis not present

## 2023-04-07 DIAGNOSIS — I6381 Other cerebral infarction due to occlusion or stenosis of small artery: Secondary | ICD-10-CM | POA: Diagnosis not present

## 2023-04-07 DIAGNOSIS — R278 Other lack of coordination: Secondary | ICD-10-CM

## 2023-04-07 DIAGNOSIS — M6281 Muscle weakness (generalized): Secondary | ICD-10-CM | POA: Diagnosis not present

## 2023-04-07 NOTE — Therapy (Addendum)
Occupational Therapy Treatment Note  Patient Name: [name] MRN: [id number] DOB:07-15-1941, 82 y.o., female Today's Date: 04/07/2023  PCP: Dr. Judithann Sheen REFERRING PROVIDER:  Dr. Judithann Sheen  END OF SESSION:  OT End of Session - 04/07/23 1739     Visit Number 29    Number of Visits 48    Date for OT Re-Evaluation 05/26/23    OT Start Time 1652    OT Stop Time 1730    OT Time Calculation (min) 38 min    Activity Tolerance Patient tolerated treatment well    Behavior During Therapy WFL for tasks assessed/performed             Past Medical History:  Diagnosis Date   Anemia    Arthritis    Asthma    uses inhaler just prior to surgery to avoid attack   Back pain    from previous injury   Complication of anesthesia    has woken  up during 2 different surgery   Depression    no current issue/treatment; situation   Gallstones    GERD (gastroesophageal reflux disease)    Hiatal hernia    patient does NOT have nerve/muscle disease   History of kidney stones    HLD (hyperlipidemia)    HTN (hypertension)    Hypothyroidism    Kidney stones    Knee pain    Nausea and vomiting 10/15/2022   Non-diabetic pancreatic hormone dysfunction years   pt. 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   Pancreatitis    Pneumonia    Seizures (HCC)    caused by dye injected during a procedure   Shortness of breath    with exertion   Sinus problem    frequent infections/congestion   Stroke (HCC) 2021   reports having CVA in 2021 and having mini strokes before that   Thyroid disease    Past Surgical History:  Procedure Laterality Date   ABDOMINAL HYSTERECTOMY     APPENDECTOMY     CARPAL TUNNEL RELEASE  10+ years ago   bilateral   EYE SURGERY  3 yrs ago   bilateral cataracts   FOOT OSTEOTOMY  6 weeks ago   Left foot: great, 2nd & 3rd   FOOT OSTEOTOMY  5 years ago   Right great toe   HAND SURGERY Bilateral 2011-most recent   multiple hand surgeries, 2 on left, 3 on right   KNEE  ARTHROPLASTY Right 04/28/2022   Procedure: COMPUTER ASSISTED TOTAL KNEE ARTHROPLASTY;  Surgeon: Donato Heinz, MD;  Location: [facility];  Service: Orthopedics;  Laterality: Right;   LOOP RECORDER INSERTION N/A 05/16/2020   Procedure: LOOP RECORDER INSERTION;  Surgeon: Marcina Millard, MD;  Location: [facility];  Service: Cardiovascular;  Laterality: N/A;   NASAL SINUS SURGERY  most recent 7-8 yrs ago   7 sinus surgeries    TRIGGER FINGER RELEASE  11/19/2011   Procedure: RELEASE TRIGGER FINGER/A-1 PULLEY;  Surgeon: Nicki Reaper, MD;  Location: [facility];  Service: Orthopedics;  Laterality: Right;  release a-1 pulley right index finger and cyst removal   WRIST GANGLION EXCISION  1980's   right   Patient Active Problem List   Diagnosis Date Noted   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 11/27/2022   Hypomagnesemia 11/27/2022   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   HLD (hyperlipidemia) 11/23/2022   Asthma 11/23/2022   Depression with anxiety 11/23/2022   Chronic diastolic CHF (congestive heart failure) (HCC) 11/23/2022  Normocytic anemia 11/23/2022   Aspiration pneumonia (HCC) 11/23/2022   AKI (acute kidney injury) (HCC) 11/23/2022   Abdominal pain 11/23/2022   Mesenteric mass 11/23/2022   Coffee ground emesis 11/23/2022   Nausea and vomiting 10/15/2022   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   Fatigue 09/17/2022   Left hemiparesis (HCC) 09/17/2022   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HCC) 08/22/2022   GERD (gastroesophageal reflux disease) 08/21/2022   Agitation 08/20/2022   Acute left-sided weakness 08/20/2022   Expressive aphasia    Stroke (HCC) 08/19/2022   Leukocytosis 08/19/2022   History of urticaria 04/28/2022   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   Primary osteoarthritis of left knee 02/24/2022   Primary osteoarthritis of right knee 02/24/2022   Lumbar spondylolysis 04/12/2020   History of CVA (cerebrovascular accident)  03/26/2020   Low back pain radiating to right lower extremity 03/21/2020   B12 deficiency 03/06/2020   Positive anti-CCP test 12/21/2019   Arthralgia 12/13/2019   Dermatitis 12/13/2019   Rheumatoid factor positive 12/13/2019   Essential hypertension 12/11/2018   Palpitations 12/11/2018   Acquired hypothyroidism 11/10/2018   Arthritis of knee 09/17/2016   Anxiety 11/22/2014   Asthma without status asthmaticus 11/22/2014   Benign neoplasm of colon, unspecified 11/22/2014   Environmental allergies 11/22/2014   Hypertriglyceridemia 11/22/2014   Hypokalemia 11/22/2014   Personal history of disease of skin and subcutaneous tissue 11/22/2014   REFERRING DIAG: CVA   THERAPY DIAG:  Muscle weakness (generalized)   Other lack of coordination   Rationale for Evaluation and Treatment Rehabilitation   SUBJECTIVE:   Pt. Had a recent fall last week at home during the night while not wearing the left knee  brace. Pt. Presents with bruising down the left Leg. Pt. Is requesting to retry PT.    SUBJECTIVE STATEMENT:   Pt accompanied by: significant other   PERTINENT HISTORY: Patient is an 82 year-old female who was admitted to [facility] on 11/23/2022 with a cerebellar CVA. 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 Pt. Was admitted to Inpatient Rehab from 12/21-1/02/2023. 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  Patient underwent inpatient rehabilitation for 2 weeks.  Patient was assessed and was scheduled for a knee replacement on 08/29/2022 however had to cancel it due to having had a CVA.  Patient had a recent fall 2 days after discharging from inpatient rehab. Past Medical History includes: Knee replacement, essential HTN, hypokalemia, leukocytosis, seizures, positive anti-- CCP test, anxiety disorder, mini strokes.  Patient had shingles with left eye nerve pain s/p 1 year ago.    PRECAUTIONS: Fall   WEIGHT  BEARING RESTRICTIONS No   PAIN:  Are you having pain?  5/10   FALLS: Has patient fallen in last 6 months? Yes. Number of falls 1   LIVING ENVIRONMENT: Lives with: Lives with Spouse Lives in: House/apartment Stairs: 2 storey home, resides on the first floor.  External: 2 stairs front no rails, and 6 in back with rails Has following equipment at home: Single point cane, Walker - 2 wheeled, Environmental consultant - 4 wheeled, Shower bench, and bed side commode   PLOF: Independent   PATIENT GOALS  To Regain the use of her left arm   OBJECTIVE:    HAND DOMINANCE: Right   ADLs: Overall ADLs: Husband assists pt. as needed Transfers/ambulation related to ADLs:Pt. Uses a 3 wheeled walker with Husband assist. Eating: Pt. Is independent with the right hand. Pt. has difficulty cutting food. Grooming: Pt. is using  her right hand, however has difficulty sustaining her LUE in elevation to assist with haircare. UB Dressing: Pt. Is independent donning a pullover shirt, and button down shirt. Has difficulty with buttoning, LB Dressing:  Independent donning pants, and socks. Difficulty tying shoes. Toileting: Independent Bathing: Pt. Is able to engage her right hand. Tub Shower transfers: Supervision Equipment: See above for equipment     IADLs: Shopping:  Has not had the opportunity for grocery shopping yet Light housekeeping: Husband is assisting with light house keeping Meal Prep:  Dependent Community mobility: Relies of family/friends Medication management: Husband assisting with weekly pillbox set-up, and administering medication. Financial management: TBD Handwriting: 75% legible   MOBILITY STATUS: Hx of falls   POSTURE COMMENTS:  No Significant postural limitations Sitting balance: supported sitting balance WFL   ACTIVITY TOLERANCE: Activity tolerance:  Fatigues in greater than 30 min.    FUNCTIONAL OUTCOME MEASURES: FOTO: 57   UPPER EXTREMITY ROM      Active ROM Right Eval: WFL  Left eval Left  01/22/23 Left  03/03/23  Shoulder flexion   132 100 108  Shoulder abduction   80 85 85  Shoulder adduction        Shoulder extension        Shoulder internal rotation        Shoulder external rotation        Elbow flexion   140 140 WFL  Elbow extension   WNL WNL WFL  Wrist flexion   65 68   Wrist extension   -10 20 24   Wrist ulnar deviation     12 10  Wrist radial deviation     8 14  Wrist pronation        Wrist supination        (Blank rows = not tested)   Left digit flexion to [facility]: 2nd: 0cm, 3rd: 0cm, 4th: 0cm, 5th: 0cm   Limited Left full 2nd digit extension     UPPER EXTREMITY MMT:      MMT Right Eval: 4+/5 overall Left Eval Left 01/22/23 Left 03/03/23  Shoulder flexion   3/5 3-/5 3-/5  Shoulder abduction   3-/5 3-/5 3-/5  Shoulder adduction        Shoulder extension        Shoulder internal rotation        Shoulder external rotation        Middle trapezius        Lower trapezius        Elbow flexion   3+/5 4/5 N/A  Elbow extension   3+/5 4/4 N/A  Wrist flexion        Wrist extension   2-/5 3-/5 3-/5  Wrist ulnar deviation        Wrist radial deviation        Wrist pronation        Wrist supination        (Blank rows = not tested)   HAND FUNCTION: Grip strength: Right: 26#, Left: 10# Pinch strength: Right 8#, Left: 3#, 3 Pt. Pinch strength: Right: 9#, L: 2#  01/22/2023 Grip strength: Right: 26#, Left: 12# Pinch strength:  Pinch meter used at the initial eval has been sent out for recalibration   03/03/2023 Grip strength: Right: 26#, Left: 13# Pinch strength:  Pinch meter used at the initial eval has been sent out for recalibration     COORDINATION: Right: 22 sec., Left: <5 min. To place 7 pegs with increased compensation proximally in the  trunk, and through reflexive associated reactions.  01/22/23 Right: 22 sec., Left: 3 min. & 4 sec.  03/03/23 Right: 22 sec., Left: 1 min. & 39 sec.   SENSATION: Light touch: WFL, proprioceptive  awareness: Intact   EDEMA: N/A   MUSCLE TONE: LUE: Hypotonic   COGNITION: Overall cognitive status: WFL for tasks assessed. Pt. Is impulsive at times.   VISION: Subjective report: Pt. report having shingles affecting left eye  s/p 1 year. Has nerve pain Baseline vision: Wears glasses for reading only Visual history:  updated see clinical impression   VISION ASSESSMENT:    WFL for tasks performed   PERCEPTION: Intact   PRAXIS: Impaired: Motor planning   OBSERVATIONS:  Pt. more alert, and engaging since prior to the most recent hospitalization.     TODAY'S TREATMENT:    Therapeutic Ex:   Pt. tolerated AROM/AAROM for the LUE. Pt. worked on [employer], and reciprocal motion using the UBE while seated for 8 min. with no resistance. Pt. performed reps on 2nd digit extension at the tabletop.  Neuro-muscular re-education:  Pt. worked on grasping, and manipulating 1", 3/4", and 1/2" washers from a magnetic dish using 2 point grasp pattern. Pt. worked on [employer] the washers.        PATIENT EDUCATION: Education details: left hand digit extension Person educated: Patient and Spouse Education method: Medical illustrator Education comprehension: verbalized understanding, returned demonstration, and needs further education     HOME EXERCISE PROGRAM:    Reviewed activities at home to promote isolated 2nd digit extension.    GOALS: Goals reviewed with patient? Yes   SHORT TERM GOALS: Target date: 01/27/2023       1. Patient will be independent with home exercise program for the left upper extremity Baseline: 02/01/2023: Pt. Consistently attempts to perform HEPs independently. No current home exercise program Goal status: Ongoing     LONG TERM GOALS: Target date: 03/10/2023     Patient will improve left shoulder strength by 2 mm grades to be able to sustain UE's in elevation long enough to wash her hair.  Baseline: 03/03/2023: Left shoulder flexion: 3-/5,  abduction: 3-/5 01/22/2023: Left shoulder flexion: 3-/5, abduction: 3-/5 Eval: Left shoulder flexion: 3/5, abduction: 3-/5 Goal status: Ongoing   2.  Patient will improve left shoulder active abduction to be able to comb her hair Baseline: 03/03/2023: Shoulder abduction: 85 01/22/2023: Shoulder abduction: 85 Eval: Left shoulder abduction is 80(108) Goal status: Ongoing   3.  Patient will independently button her shirt with modified independence. Baseline: 03/03/2023: Pt. Continues to have difficulty with buttoning. 01/22/2023: Pt. continues to have difficulty. Eval: Patient has difficulty.  Goal status: Ongoing   4.  Patient well improve left grip strength in preparation  for securely holding flowers. Baseline:03/03/2023: left grip strength: 13# 01/22/2023: Left: 12# Eval: Pt. Is unable to securely hold flowers. Goal status: Ongoing   5.  Pt. will independently recall adaptive  strategies for performing ADL tasks including: flossing teeth, donning bra, applying makeup. Baseline: 03/03/2023: Continue 01/22/2023: Pt. continues to benefit from education about adaptive strategies during ADLs, and IADLs. Eval: Pt. to be provided with adaptive strategies. Goal status: Ongoing   6.  Pt. will improve Foto score by 2 points to reflect patient perceived performance improvement assessment specific ADLs  and IADLs Baseline: 1/61/09: FOTO 62 Eval: 57 Goal status: Ongoing  7.  Pt. will improve left hand coordination skills in order to be able to handle, and sort utensils in a drawer.  Baseline: 03/03/23: 1 min. & 39 01/22/2022: Left: 3 min. & 4 sec. Eval: Pt. has difficulty sorting, and placing utensils with the left hand. Left FMC : >5 min. For 7 pegs on the 9 hole peg test.    Goal Status:  Ongoing  8. Pt. will improve active left 2nd digit extension to be able able to isolate her 2nd digit in preparation for pressing/pushing buttons on appliances, phones, or remotes. Baseline: 03/03/23: Pt. Continues to work  on improving consistency with 2nd digit extension to press the remote. 01/22/2023: is able to perform full digit extension, although 2nd digit is slow to extend s 2/2 flexor tone. Pt.  Eval: Pt. is able to is unable to actively perform full digit extension Goal status:  Ongoing   ASSESSMENT:   CLINICAL IMPRESSION:   Pt. reports having a fall this past Friday when attempting to stand and fasten clothing after waking up to use the bathroom during the night. Pt. continues to present with good tolerance for exercises today. Pt. presented with less flexor tone, and tightness through the left 2nd digit when grasping, and stacking washers. Pt. performed reps of alternating weightbearing through the left hand during [facility] tasks. Pt continues to work on improving L shoulder, elbow, and wrist strength, improving motor control and coordination in order to prepare the left upper extremity and hand for functional reaching and engaging the upper left extremity when performing hair care, buttoning, holding flowers, cutting food, and manipulating utensils to set the table as well as adaptive strategies during daily IADL care.   PERFORMANCE DEFICITS in functional skills including ADLs, IADLs, coordination, proprioception, ROM, strength, FMC, and GMC, cognitive skills including memory, and psychosocial skills including coping strategies, environmental adaptation, interpersonal interactions, and routines and behaviors.    IMPAIRMENTS are limiting patient from ADLs, IADLs, education, leisure, and social participation.    COMORBIDITIES may have co-morbidities  that affects occupational performance. Patient will benefit from skilled OT to address above impairments and improve overall function.   MODIFICATION OR ASSISTANCE TO COMPLETE EVALUATION: Min-Moderate modification of tasks or assist with assess necessary to complete an evaluation.   OT OCCUPATIONAL PROFILE AND HISTORY: Detailed assessment: Review of records and  additional review of physical, cognitive, psychosocial history related to current functional performance.   CLINICAL DECISION MAKING: Moderate - several treatment options, min-mod task modification necessary   REHAB POTENTIAL: Good   EVALUATION COMPLEXITY: Moderate      PLAN: OT FREQUENCY: 2x/week   OT DURATION: 12 weeks   PLANNED INTERVENTIONS: self care/ADL training, therapeutic exercise, therapeutic activity, neuromuscular re-education, manual therapy, passive range of motion, functional mobility training, electrical stimulation, and paraffin   RECOMMENDED OTHER SERVICES: PT   CONSULTED AND AGREED WITH PLAN OF CARE: Patient and family member/caregiver   PLAN FOR NEXT SESSION: Initiate OT treatment  Olegario Messier, MS, OTR/L   04/02/2023

## 2023-04-09 ENCOUNTER — Ambulatory Visit: Payer: Medicare HMO | Attending: Physical Medicine and Rehabilitation | Admitting: Occupational Therapy

## 2023-04-09 DIAGNOSIS — M6281 Muscle weakness (generalized): Secondary | ICD-10-CM

## 2023-04-09 DIAGNOSIS — R278 Other lack of coordination: Secondary | ICD-10-CM | POA: Insufficient documentation

## 2023-04-09 DIAGNOSIS — R262 Difficulty in walking, not elsewhere classified: Secondary | ICD-10-CM | POA: Insufficient documentation

## 2023-04-09 DIAGNOSIS — R2681 Unsteadiness on feet: Secondary | ICD-10-CM | POA: Diagnosis not present

## 2023-04-09 NOTE — Therapy (Signed)
Occupational Therapy Progress Note  Dates of reporting period  03/03/2023   to   04/09/2023   Patient Name: [name] MRN: [id number] DOB:[birth year], 82 y.o., female Today's Date: 04/09/2023  PCP: Dr. Judithann Sheen REFERRING PROVIDER:  Dr. Judithann Sheen  END OF SESSION:  OT End of Session - 04/09/23 1708     Visit Number 30    Number of Visits 48    Date for OT Re-Evaluation 05/26/23    OT Start Time 1600    OT Stop Time 1645    OT Time Calculation (min) 45 min    Activity Tolerance Patient tolerated treatment well    Behavior During Therapy WFL for tasks assessed/performed             Past Medical History:  Diagnosis Date   Anemia    Arthritis    Asthma    uses inhaler just prior to surgery to avoid attack   Back pain    from previous injury   Complication of anesthesia    has woken  up during 2 different surgery   Depression    no current issue/treatment; situation   Gallstones    GERD (gastroesophageal reflux disease)    Hiatal hernia    patient does NOT have nerve/muscle disease   History of kidney stones    HLD (hyperlipidemia)    HTN (hypertension)    Hypothyroidism    Kidney stones    Knee pain    Nausea and vomiting 10/15/2022   Non-diabetic pancreatic hormone dysfunction years   pt. 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   Pancreatitis    Pneumonia    Seizures (HCC)    caused by dye injected during a procedure   Shortness of breath    with exertion   Sinus problem    frequent infections/congestion   Stroke (HCC) 2021   reports having CVA in 2021 and having mini strokes before that   Thyroid disease    Past Surgical History:  Procedure Laterality Date   ABDOMINAL HYSTERECTOMY     APPENDECTOMY     CARPAL TUNNEL RELEASE  10+ years ago   bilateral   EYE SURGERY  3 yrs ago   bilateral cataracts   FOOT OSTEOTOMY  6 weeks ago   Left foot: great, 2nd & 3rd   FOOT OSTEOTOMY  5 years ago   Right great toe   HAND SURGERY Bilateral 2011-most recent    multiple hand surgeries, 2 on left, 3 on right   KNEE ARTHROPLASTY Right 04/28/2022   Procedure: COMPUTER ASSISTED TOTAL KNEE ARTHROPLASTY;  Surgeon: Donato Heinz, MD;  Location: [facility];  Service: Orthopedics;  Laterality: Right;   LOOP RECORDER INSERTION N/A 05/16/2020   Procedure: LOOP RECORDER INSERTION;  Surgeon: Marcina Millard, MD;  Location: [facility];  Service: Cardiovascular;  Laterality: N/A;   NASAL SINUS SURGERY  most recent 7-8 yrs ago   7 sinus surgeries    TRIGGER FINGER RELEASE  11/19/2011   Procedure: RELEASE TRIGGER FINGER/A-1 PULLEY;  Surgeon: Nicki Reaper, MD;  Location: [facility];  Service: Orthopedics;  Laterality: Right;  release a-1 pulley right index finger and cyst removal   WRIST GANGLION EXCISION  1980's   right   Patient Active Problem List   Diagnosis Date Noted   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 11/27/2022   Hypomagnesemia 11/27/2022   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   HLD (hyperlipidemia) 11/23/2022   Asthma 11/23/2022  Depression with anxiety 11/23/2022   Chronic diastolic CHF (congestive heart failure) (HCC) 11/23/2022   Normocytic anemia 11/23/2022   Aspiration pneumonia (HCC) 11/23/2022   AKI (acute kidney injury) (HCC) 11/23/2022   Abdominal pain 11/23/2022   Mesenteric mass 11/23/2022   Coffee ground emesis 11/23/2022   Nausea and vomiting 10/15/2022   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   Fatigue 09/17/2022   Left hemiparesis (HCC) 09/17/2022   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HCC) 08/22/2022   GERD (gastroesophageal reflux disease) 08/21/2022   Agitation 08/20/2022   Acute left-sided weakness 08/20/2022   Expressive aphasia    Stroke (HCC) 08/19/2022   Leukocytosis 08/19/2022   History of urticaria 04/28/2022   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   Primary osteoarthritis of left knee 02/24/2022   Primary osteoarthritis of right knee 02/24/2022   Lumbar spondylolysis  04/12/2020   History of CVA (cerebrovascular accident) 03/26/2020   Low back pain radiating to right lower extremity 03/21/2020   B12 deficiency 03/06/2020   Positive anti-CCP test 12/21/2019   Arthralgia 12/13/2019   Dermatitis 12/13/2019   Rheumatoid factor positive 12/13/2019   Essential hypertension 12/11/2018   Palpitations 12/11/2018   Acquired hypothyroidism 11/10/2018   Arthritis of knee 09/17/2016   Anxiety 11/22/2014   Asthma without status asthmaticus 11/22/2014   Benign neoplasm of colon, unspecified 11/22/2014   Environmental allergies 11/22/2014   Hypertriglyceridemia 11/22/2014   Hypokalemia 11/22/2014   Personal history of disease of skin and subcutaneous tissue 11/22/2014   REFERRING DIAG: CVA   THERAPY DIAG:  Muscle weakness (generalized)   Other lack of coordination   Rationale for Evaluation and Treatment Rehabilitation   SUBJECTIVE:   Pt. Reports being tired today.   SUBJECTIVE STATEMENT:   Pt accompanied by: significant other   PERTINENT HISTORY: Patient is an 82 year-old female who was admitted to [facility] on 11/23/2022 with a cerebellar CVA. 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 Pt. Was admitted to Inpatient Rehab from 12/21-1/02/2023. 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  Patient underwent inpatient rehabilitation for 2 weeks.  Patient was assessed and was scheduled for a knee replacement on 08/29/2022 however had to cancel it due to having had a CVA.  Patient had a recent fall 2 days after discharging from inpatient rehab. Past Medical History includes: Knee replacement, essential HTN, hypokalemia, leukocytosis, seizures, positive anti-- CCP test, anxiety disorder, mini strokes.  Patient had shingles with left eye nerve pain s/p 1 year ago.    PRECAUTIONS: Fall   WEIGHT BEARING RESTRICTIONS No   PAIN:  Are you having pain?  7/10 LUE Intermittent initially.  Improved with ROM.   FALLS: Has patient fallen in last 6 months? Yes. Number of falls 1   LIVING ENVIRONMENT: Lives with: Lives with Spouse Lives in: House/apartment Stairs: 2 storey home, resides on the first floor.  External: 2 stairs front no rails, and 6 in back with rails Has following equipment at home: Single point cane, Walker - 2 wheeled, Environmental consultant - 4 wheeled, Shower bench, and bed side commode   PLOF: Independent   PATIENT GOALS  To Regain the use of her left arm   OBJECTIVE:    HAND DOMINANCE: Right   ADLs: Overall ADLs: Husband assists pt. as needed Transfers/ambulation related to ADLs:Pt. Uses a 3 wheeled walker with Husband assist. Eating: Pt. Is independent with the right hand. Pt. has difficulty cutting food. Grooming: Pt. is using her right hand, however has difficulty sustaining her  LUE in elevation to assist with haircare. UB Dressing: Pt. Is independent donning a pullover shirt, and button down shirt. Has difficulty with buttoning, LB Dressing:  Independent donning pants, and socks. Difficulty tying shoes. Toileting: Independent Bathing: Pt. Is able to engage her right hand. Tub Shower transfers: Supervision Equipment: See above for equipment     IADLs: Shopping:  Has not had the opportunity for grocery shopping yet Light housekeeping: Husband is assisting with light house keeping Meal Prep:  Dependent Community mobility: Relies of family/friends Medication management: Husband assisting with weekly pillbox set-up, and administering medication. Financial management: TBD Handwriting: 75% legible   MOBILITY STATUS: Hx of falls   POSTURE COMMENTS:  No Significant postural limitations Sitting balance: supported sitting balance WFL   ACTIVITY TOLERANCE: Activity tolerance:  Fatigues in greater than 30 min.    FUNCTIONAL OUTCOME MEASURES: FOTO: 57   UPPER EXTREMITY ROM      Active ROM Right Eval: WFL Left eval Left  01/22/23 Left  03/03/23  Shoulder  flexion   132 100 108  Shoulder abduction   80 85 85  Shoulder adduction        Shoulder extension        Shoulder internal rotation        Shoulder external rotation        Elbow flexion   140 140 WFL  Elbow extension   WNL WNL WFL  Wrist flexion   65 68   Wrist extension   -10 20 24   Wrist ulnar deviation     12 10  Wrist radial deviation     8 14  Wrist pronation        Wrist supination        (Blank rows = not tested)   Left digit flexion to [facility]: 2nd: 0cm, 3rd: 0cm, 4th: 0cm, 5th: 0cm   Limited Left full 2nd digit extension     UPPER EXTREMITY MMT:      MMT Right Eval: 4+/5 overall Left Eval Left 01/22/23 Left 03/03/23  Shoulder flexion   3/5 3-/5 3-/5  Shoulder abduction   3-/5 3-/5 3-/5  Shoulder adduction        Shoulder extension        Shoulder internal rotation        Shoulder external rotation        Middle trapezius        Lower trapezius        Elbow flexion   3+/5 4/5 N/A  Elbow extension   3+/5 4/4 N/A  Wrist flexion        Wrist extension   2-/5 3-/5 3-/5  Wrist ulnar deviation        Wrist radial deviation        Wrist pronation        Wrist supination        (Blank rows = not tested)   HAND FUNCTION: Grip strength: Right: 26#, Left: 10# Pinch strength: Right 8#, Left: 3#, 3 Pt. Pinch strength: Right: 9#, L: 2#  01/22/2023 Grip strength: Right: 26#, Left: 12# Pinch strength:  Pinch meter used at the initial eval has been sent out for recalibration   03/03/2023 Grip strength: Right: 26#, Left: 13# Pinch strength:  Pinch meter used at the initial eval has been sent out for recalibration     COORDINATION: Right: 22 sec., Left: <5 min. To place 7 pegs with increased compensation proximally in the trunk, and through reflexive associated reactions.  01/22/23  Right: 22 sec., Left: 3 min. & 4 sec.  03/03/23 Right: 22 sec., Left: 1 min. & 39 sec.   SENSATION: Light touch: WFL, proprioceptive awareness: Intact   EDEMA: N/A   MUSCLE TONE: LUE:  Hypotonic   COGNITION: Overall cognitive status: WFL for tasks assessed. Pt. Is impulsive at times.   VISION: Subjective report: Pt. report having shingles affecting left eye  s/p 1 year. Has nerve pain Baseline vision: Wears glasses for reading only Visual history:  updated see clinical impression   VISION ASSESSMENT:    WFL for tasks performed   PERCEPTION: Intact   PRAXIS: Impaired: Motor planning   OBSERVATIONS:  Pt. more alert, and engaging since prior to the most recent hospitalization.     TODAY'S TREATMENT:    Therapeutic Ex:   Pt. tolerated AROM/AAROM for the LUE. Pt. worked on [employer], and reciprocal motion using the UBE while seated for 8 min. with no resistance. Pt. performed reps on 2nd digit extension at the tabletop.  Neuro-muscular re-education:  Pt. worked on grasping, and manipulating 1", 3/4", and 1/2" washers from a magnetic dish using 2 point grasp pattern. Pt. worked on [employer] the washers. Pt. worked on translatory movements grasping, and storing 1" circular objects, 1" washers, and 1/2" flat marbles. Pt. worked on moving the objects through her hand from her palm to the tip of her 2nd and thumb.        PATIENT EDUCATION: Education details: left hand digit extension Person educated: Patient and Spouse Education method: Medical illustrator Education comprehension: verbalized understanding, returned demonstration, and needs further education     HOME EXERCISE PROGRAM:    Reviewed activities at home to promote isolated 2nd digit extension.    GOALS: Goals reviewed with patient? Yes   SHORT TERM GOALS: Target date: 01/27/2023       1. Patient will be independent with home exercise program for the left upper extremity Baseline: 04/09/2023: Pt. Continues to consistently attempt to engage her hand at hand 02/01/2023: Pt. Consistently attempts to perform HEPs independently. No current home exercise program Goal status: Ongoing      LONG TERM GOALS: Target date: 05/26/2023     Patient will improve left shoulder strength by 2 mm grades to be able to sustain UE's in elevation long enough to wash her hair.  Baseline: 04/09/2023: Pt. is limited with sustaining LUE elevation long enough to perform hair care thoroughly. 03/03/2023: Left shoulder flexion: 3-/5, abduction: 3-/5 01/22/2023: Left shoulder flexion: 3-/5, abduction: 3-/5 Eval: Left shoulder flexion: 3/5, abduction: 3-/5 Goal status: Ongoing   2.  Patient will improve left shoulder active abduction to be able to comb her hair Baseline:  04/09/2023: Pt. Pt. Presents with difficulty abducting her left shoulder enough to thoroughly complete haircare. 03/03/2023: Shoulder abduction: 85 01/22/2023: Shoulder abduction: 85 Eval: Left shoulder abduction is 80(108) Goal status: Ongoing   3.  Patient will independently button her shirt with modified independence. Baseline: 04/09/2023: Pt. Continues to progress towards buttoning. 03/03/2023: Pt. Continues to have difficulty with buttoning. 01/22/2023: Pt. continues to have difficulty. Eval: Patient has difficulty.  Goal status: Ongoing   4.  Patient well improve left grip strength in preparation for securely holding flowers. Baseline:04/09/2023: Pt. Is able to hold, and hike pants with the left hand, continues to have difficulty with securely holding flowers.   03/03/2023: left grip strength: 13# 01/22/2023: Left: 12# Eval: Pt. Is unable to securely hold flowers. Goal status: Ongoing   5.  Pt. will  independently recall adaptive  strategies for performing ADL tasks including: flossing teeth, donning bra, applying makeup. Baseline: 04/09/2023: Continue3/26/2024: Continue 01/22/2023: Pt. continues to benefit from education about adaptive strategies during ADLs, and IADLs. Eval: Pt. to be provided with adaptive strategies. Goal status: Ongoing   6.  Pt. will improve Foto score by 2 points to reflect patient perceived performance improvement  assessment specific ADLs  and IADLs Baseline: 2/95/621:  TBD 03/03/23: FOTO 62 Eval: 57 Goal status: Ongoing  7.  Pt. will improve left hand coordination skills in order to be able to handle, and sort utensils in a drawer.     Baseline:  04/09/2023: TBD 03/03/23: 1 min. & 39 01/22/2022: Left: 3 min. & 4 sec. Eval: Pt. has difficulty sorting, and placing utensils with the left hand. Left FMC : >5 min. For 7 pegs on the 9 hole peg test.    Goal Status:  Ongoing  8. Pt. will improve active left 2nd digit extension to be able able to isolate her 2nd digit in preparation for pressing/pushing buttons on appliances, phones, or remotes. Baseline: 04/09/2023: Pt. Is progressing with isolating left 2nd digit extension, however continues to present with limited increased flexor tone. 03/03/23: Pt. Continues to work on improving consistency with 2nd digit extension to press the remote. 01/22/2023: is able to perform full digit extension, although 2nd digit is slow to extend s 2/2 flexor tone. Pt.  Eval: Pt. is able to is unable to actively perform full digit extension Goal status:  Ongoing   ASSESSMENT:   CLINICAL IMPRESSION:   Pt. has had  multiple falls recently. The most recent fall occurred this past Friday. Pt. Continues to present  with bruising on the LLE. Pt. Has been scheduled for a PT evaluation next week. Pt. Has a left knee brace, however reports not being able to wear it since the fall due to pain. Pt. Reports that she went to the bathroom independently twice at home without her brace, however reports that she was very, very  fearful that she might fall. Discussed with the Pt. And caregiver about possibly obtaining walkie talkies to be able to quickly communicate with each other when her husband is outside working on projects. (Pt. reports having difficulty quickly accessing cellphones.)  Pt. is making progress with LUE hand function skills and is engaging her hand more during tasks at home. Pt. is using  her left hand to hold, and hike her pants.  Pt. Continues to work on improving isolated 2nd digit extension in preparation for improving prehension patterns. Pt continues to work on improving L shoulder, elbow, and wrist strength, improving motor control and coordination in order to prepare the left upper extremity and hand for functional reaching and engaging the upper left extremity when performing hair care, buttoning, holding flowers, cutting food, and manipulating utensils to set the table as well as adaptive strategies during daily IADL care.   PERFORMANCE DEFICITS in functional skills including ADLs, IADLs, coordination, proprioception, ROM, strength, FMC, and GMC, cognitive skills including memory, and psychosocial skills including coping strategies, environmental adaptation, interpersonal interactions, and routines and behaviors.    IMPAIRMENTS are limiting patient from ADLs, IADLs, education, leisure, and social participation.    COMORBIDITIES may have co-morbidities  that affects occupational performance. Patient will benefit from skilled OT to address above impairments and improve overall function.   MODIFICATION OR ASSISTANCE TO COMPLETE EVALUATION: Min-Moderate modification of tasks or assist with assess necessary to complete an evaluation.   OT OCCUPATIONAL  PROFILE AND HISTORY: Detailed assessment: Review of records and additional review of physical, cognitive, psychosocial history related to current functional performance.   CLINICAL DECISION MAKING: Moderate - several treatment options, min-mod task modification necessary   REHAB POTENTIAL: Good   EVALUATION COMPLEXITY: Moderate      PLAN: OT FREQUENCY: 2x/week   OT DURATION: 12 weeks   PLANNED INTERVENTIONS: self care/ADL training, therapeutic exercise, therapeutic activity, neuromuscular re-education, manual therapy, passive range of motion, functional mobility training, electrical stimulation, and paraffin   RECOMMENDED  OTHER SERVICES: PT   CONSULTED AND AGREED WITH PLAN OF CARE: Patient and family member/caregiver   PLAN FOR NEXT SESSION: Initiate OT treatment  Olegario Messier, MS, OTR/L   04/02/2023

## 2023-04-14 ENCOUNTER — Ambulatory Visit: Payer: Medicare HMO | Admitting: Occupational Therapy

## 2023-04-14 ENCOUNTER — Ambulatory Visit: Payer: Medicare HMO

## 2023-04-14 DIAGNOSIS — R262 Difficulty in walking, not elsewhere classified: Secondary | ICD-10-CM | POA: Diagnosis not present

## 2023-04-14 DIAGNOSIS — R2681 Unsteadiness on feet: Secondary | ICD-10-CM | POA: Diagnosis not present

## 2023-04-14 DIAGNOSIS — M6281 Muscle weakness (generalized): Secondary | ICD-10-CM | POA: Diagnosis not present

## 2023-04-14 DIAGNOSIS — R278 Other lack of coordination: Secondary | ICD-10-CM

## 2023-04-14 NOTE — Therapy (Signed)
OUTPATIENT PHYSICAL THERAPY NEURO EVALUATION   Patient Name: [name] MRN: [id number] DOB:1941/10/17, 82 y.o., female Today's Date: 04/14/2023   PCP: Aram Beecham, D MD REFERRING PROVIDER: Jacquelynn Cree PA   END OF SESSION:  PT End of Session - 04/14/23 1504     Visit Number 1    Number of Visits 24    Date for PT Re-Evaluation 07/07/23    Authorization Type 1/10 eval 04/14/23    PT Start Time 1430    PT Stop Time 1513    PT Time Calculation (min) 43 min    Equipment Utilized During Treatment Gait belt    Activity Tolerance Patient limited by pain    Behavior During Therapy WFL for tasks assessed/performed             Past Medical History:  Diagnosis Date   Anemia    Arthritis    Asthma    uses inhaler just prior to surgery to avoid attack   Back pain    from previous injury   Complication of anesthesia    has woken  up during 2 different surgery   Depression    no current issue/treatment; situation   Gallstones    GERD (gastroesophageal reflux disease)    Hiatal hernia    patient does NOT have nerve/muscle disease   History of kidney stones    HLD (hyperlipidemia)    HTN (hypertension)    Hypothyroidism    Kidney stones    Knee pain    Nausea and vomiting 10/15/2022   Non-diabetic pancreatic hormone dysfunction years   pt. 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   Pancreatitis    Pneumonia    Seizures (HCC)    caused by dye injected during a procedure   Shortness of breath    with exertion   Sinus problem    frequent infections/congestion   Stroke (HCC) 2021   reports having CVA in 2021 and having mini strokes before that   Thyroid disease    Past Surgical History:  Procedure Laterality Date   ABDOMINAL HYSTERECTOMY     APPENDECTOMY     CARPAL TUNNEL RELEASE  10+ years ago   bilateral   EYE SURGERY  3 yrs ago   bilateral cataracts   FOOT OSTEOTOMY  6 weeks ago   Left foot: great, 2nd & 3rd   FOOT OSTEOTOMY  5 years ago   Right  great toe   HAND SURGERY Bilateral 2011-most recent   multiple hand surgeries, 2 on left, 3 on right   KNEE ARTHROPLASTY Right 04/28/2022   Procedure: COMPUTER ASSISTED TOTAL KNEE ARTHROPLASTY;  Surgeon: Donato Heinz, MD;  Location: [facility];  Service: Orthopedics;  Laterality: Right;   LOOP RECORDER INSERTION N/A 05/16/2020   Procedure: LOOP RECORDER INSERTION;  Surgeon: Marcina Millard, MD;  Location: [facility];  Service: Cardiovascular;  Laterality: N/A;   NASAL SINUS SURGERY  most recent 7-8 yrs ago   7 sinus surgeries    TRIGGER FINGER RELEASE  11/19/2011   Procedure: RELEASE TRIGGER FINGER/A-1 PULLEY;  Surgeon: Nicki Reaper, MD;  Location: [facility];  Service: Orthopedics;  Laterality: Right;  release a-1 pulley right index finger and cyst removal   WRIST GANGLION EXCISION  1980's   right   Patient Active Problem List   Diagnosis Date Noted   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 11/27/2022   Hypomagnesemia 11/27/2022   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  HLD (hyperlipidemia) 11/23/2022   Asthma 11/23/2022   Depression with anxiety 11/23/2022   Chronic diastolic CHF (congestive heart failure) (HCC) 11/23/2022   Normocytic anemia 11/23/2022   Aspiration pneumonia (HCC) 11/23/2022   AKI (acute kidney injury) (HCC) 11/23/2022   Abdominal pain 11/23/2022   Mesenteric mass 11/23/2022   Coffee ground emesis 11/23/2022   Nausea and vomiting 10/15/2022   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   Fatigue 09/17/2022   Left hemiparesis (HCC) 09/17/2022   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HCC) 08/22/2022   GERD (gastroesophageal reflux disease) 08/21/2022   Agitation 08/20/2022   Acute left-sided weakness 08/20/2022   Expressive aphasia    Stroke (HCC) 08/19/2022   Leukocytosis 08/19/2022   History of urticaria 04/28/2022   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   Primary osteoarthritis of left knee 02/24/2022   Primary  osteoarthritis of right knee 02/24/2022   Lumbar spondylolysis 04/12/2020   History of CVA (cerebrovascular accident) 03/26/2020   Low back pain radiating to right lower extremity 03/21/2020   B12 deficiency 03/06/2020   Positive anti-CCP test 12/21/2019   Arthralgia 12/13/2019   Dermatitis 12/13/2019   Rheumatoid factor positive 12/13/2019   Essential hypertension 12/11/2018   Palpitations 12/11/2018   Acquired hypothyroidism 11/10/2018   Arthritis of knee 09/17/2016   Anxiety 11/22/2014   Asthma without status asthmaticus 11/22/2014   Benign neoplasm of colon, unspecified 11/22/2014   Environmental allergies 11/22/2014   Hypertriglyceridemia 11/22/2014   Hypokalemia 11/22/2014   Personal history of disease of skin and subcutaneous tissue 11/22/2014    ONSET DATE: 11/23/22  REFERRING DIAG: TIA  THERAPY DIAG:  Muscle weakness (generalized) - Plan: PT plan of care cert/re-cert  Difficulty in walking, not elsewhere classified - Plan: PT plan of care cert/re-cert  Unsteadiness on feet - Plan: PT plan of care cert/re-cert  Rationale for Evaluation and Treatment: Rehabilitation  SUBJECTIVE:                                                                                                                                                                                             SUBJECTIVE STATEMENT: Patient presents with husband s/p CVA.  Pt accompanied by: significant other  PERTINENT HISTORY: Patient is an 82 year-old female who was [facility] to [facility] on [facility]/2022 with a cerebellar CVA. 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 Pt. Was admitted to Inpatient Rehab from 12/21-1/02/2023. 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  Patient underwent inpatient rehabilitation for 2 weeks.  Patient was assessed and was scheduled for a knee replacement on 08/29/2022 however had to cancel it due to having  had a CVA.   PMH includes: anemia, arthritis, back pain, depression, hernia, GERD, HLD, HTN, hypothyroidism, knee pain, pancreatic hormone dysfunction, pneumonia, seizures, stroke, thyroid disease.  Patient has a donjoy brace for L knee. Walking with hand hold help with husband.   PAIN:  Are you having pain? Yes: NPRS scale: 9-10/10 Pain location: L knee Pain description: aching, gives out Aggravating factors: climbing into pain, moving it, walking around house Relieving factors: rest   PRECAUTIONS: Fall  WEIGHT BEARING RESTRICTIONS: No  FALLS: Has patient fallen in last 6 months? Yes. Number of falls 4 falls   LIVING ENVIRONMENT: Lives with: lives with their family and lives with their spouse Lives in: House/apartment Stairs: Yes: Internal: flight steps;   and External: 2 steps; on right going up and on left going up Has following equipment at home: Single point cane, Walker - 2 wheeled, Shower bench, and bed side commode  PLOF: Independent  PATIENT GOALS: to have less pain and move better  OBJECTIVE:   DIAGNOSTIC FINDINGS:  Disc levels:   C2-3: No significant stenosis.   C3-4: A rightward disc osteophyte complex effaces the ventral CSF. Severe left and moderate right foraminal stenosis is present.   C4-5: A broad-based disc osteophyte complex effaces the ventral CSF. The canal is narrowed 7 mm. Severe left and moderate right foraminal stenosis is present.   C5-6: Chronic loss of disc height is present. Broad-based disc osteophyte complex is asymmetric to the right. Partial effacement of the ventral CSF is present. Moderate foraminal stenosis is worse right than left.   C6-7: A broad-based disc osteophyte complex is present. This partially effaces the ventral CSF. Moderate foraminal narrowing is worse right than left.   C7-T1: Uncovertebral spurring is present bilaterally. Mild bilateral foraminal stenosis is present.   IMPRESSION: 1. Multilevel spondylosis of the  cervical spine as described. 2. 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 3. 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 4. No other significant cord signal abnormality to suggest ischemic changes to the cord. 5. Severe left and moderate right foraminal stenosis at C3-4 and C4-5. 6. Moderate foraminal stenosis bilaterally at C5-6 and C6-7 is worse on the right. 7. Mild bilateral foraminal narrowing at C7-T1. 8. Prominent soft tissue pannus at C1-2 effaces the ventral CSF. 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  IMPRESSION: 1. 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 2. 9 mm right occipital pole infarct. 3. Punctate white matter infarct in the left occipital lobe. 4. Additional punctate cortical infarct in the medial right occipital lobe more superiorly. 5. Expected evolution of previous right posterior frontal lobe white matter infarct. 6. Stable atrophy and white matter disease likely reflects the sequela of chronic microvascular ischemia.  COGNITION: Overall cognitive status:  two strokes   SENSATION: WFL  COORDINATION: Heel slide test limited by LLE pain  POSTURE: rounded shoulders, forward head, and flexed trunk   LOWER EXTREMITY ROM:     Bowing of L knee  LOWER EXTREMITY MMT:    MMT Right Eval Left Eval  Hip flexion 3 3*  Hip extension    Hip abduction 3 3*  Hip adduction 3 *  Hip internal rotation    Hip external rotation    Knee flexion 3 3*  Knee extension 3 3*  Ankle dorsiflexion 4 4  Ankle plantarflexion 4 4  (Blank rows = not tested) *  pain  BED MOBILITY:  Per patient she has difficulty, occasionally needs help  TRANSFERS: Assistive device utilized: None  Sit to stand: CGA and Min A Stand to sit: Min A Chair to chair: CGA    GAIT: Gait pattern: step to  pattern, decreased stance time- Left, genu recurvatum- Left, and shuffling Distance walked: 10 ft Assistive device utilized: None Level of assistance: CGA Comments: Patient is very painful with ambulation, unable to ambulate longer duration   FUNCTIONAL TESTS:  5 times sit to stand: unable to tolerate 10 meter walk test: unable to tolerate  PATIENT SURVEYS:  FOTO 47  TODAY'S TREATMENT:                                                                                                                              DATE: 04/14/23     PATIENT EDUCATION: Education details: goals, POC Person educated: Patient and Spouse Education method: Explanation, Demonstration, Tactile cues, and Verbal cues Education comprehension: verbalized understanding, returned demonstration, verbal cues required, and tactile cues required  HOME EXERCISE PROGRAM: Access Code: [email or web address] URL: [email or web address] Date: 04/14/2023 Prepared by: Precious Bard  Exercises - Seated Single Leg Hip Abduction  - 1 x daily - 7 x weekly - 2 sets - 10 reps - 5 hold - Seated Heel Slide  - 1 x daily - 7 x weekly - 2 sets - 10 reps - 5 hold - Seated Heel Toe Raises  - 1 x daily - 7 x weekly - 2 sets - 10 reps - 5 hold  GOALS: Goals reviewed with patient? Yes  SHORT TERM GOALS: Target date: 05/12/2023    Patient will be independent in home exercise program to improve strength/mobility for better functional independence with ADLs. Baseline: 5/7:  Goal status: INITIAL    LONG TERM GOALS: Target date: 07/07/2023    Patient will increase FOTO score to equal to or greater than   57%  to demonstrate statistically significant improvement in mobility and quality of life.  Baseline:  47% Goal status: INITIAL  2.  Patient (> 64 years old) will complete five times sit to stand test in < 15 seconds indicating an increased LE strength and improved balance. Baseline: 5/7: unable to tolerate test Goal status:  INITIAL  3.   Patient will deny any falls over past 4 weeks to demonstrate improved safety awareness at home and work.  Baseline: 5/7: multiple falls Goal status: INITIAL  4.  Patient will increase BLE gross strength to 4+/5 as to improve functional strength for independent gait, increased standing tolerance and increased ADL ability Baseline: 5/7: see above  Goal status: INITIAL  5.  Patient will be able to perform household work/ chores without increase in symptoms. Baseline: 5/7: unable to perform  Goal status: INITIAL    ASSESSMENT:  CLINICAL IMPRESSION: Patient is an 82 y.o. female who was seen today for physical therapy evaluation and treatment for CVA. Patient's L knee is primary  limiter for mobility with extreme pain levels in standing position. She is limited with ambulation due to pain and has had multiple falls. Physical therapy will assist with strengthening of LLE to reduce instability and improve mobility. Patient educated on plan and is agreeable to attempt gentle LE strengthening and stability interventions. Patient will benefit from skilled physical therapy to reduce pain, improve mobility, improve strength and improve quality of life.    OBJECTIVE IMPAIRMENTS: Abnormal gait, cardiopulmonary status limiting activity, decreased activity tolerance, decreased balance, decreased cognition, decreased endurance, decreased knowledge of use of DME, decreased mobility, difficulty walking, decreased ROM, decreased strength, decreased safety awareness, hypomobility, increased fascial restrictions, impaired perceived functional ability, impaired flexibility, impaired UE functional use, improper body mechanics, postural dysfunction, and pain.   ACTIVITY LIMITATIONS: carrying, lifting, bending, sitting, standing, squatting, stairs, transfers, bed mobility, bathing, toileting, dressing, reach over head, hygiene/grooming, locomotion level, and caring for others  PARTICIPATION LIMITATIONS:  meal prep, cleaning, laundry, medication management, personal finances, interpersonal relationship, driving, shopping, community activity, yard work, school, and church  PERSONAL FACTORS: Age, Behavior pattern, Education, Fitness, Past/current experiences, Time since onset of injury/illness/exacerbation, and 3+ comorbidities: anemia, arthritis, back pain, depression, hernia, GERD, HLD, HTN, hypothyroidism, knee pain, pancreatic hormone dysfunction, pneumonia, seizures, stroke, thyroid disease  are also affecting patient's functional outcome.   REHAB POTENTIAL: Fair    CLINICAL DECISION MAKING: Evolving/moderate complexity  EVALUATION COMPLEXITY: Moderate  PLAN:  PT FREQUENCY: 1-2x/week  PT DURATION: 12 weeks  PLANNED INTERVENTIONS: Therapeutic exercises, Therapeutic activity, Neuromuscular re-education, Balance training, Gait training, Patient/Family education, Self Care, Joint mobilization, Joint manipulation, Stair training, Vestibular training, Canalith repositioning, Visual/preceptual remediation/compensation, Orthotic/Fit training, DME instructions, Electrical stimulation, Wheelchair mobility training, Spinal mobilization, Cryotherapy, Moist heat, Compression bandaging, scar mobilization, Splintting, Taping, Traction, Ultrasound, Manual therapy, and Re-evaluation  PLAN FOR NEXT SESSION: seated and supine strengthening of LLE    Precious Bard, PT 04/14/2023, 3:51 PM

## 2023-04-14 NOTE — Therapy (Signed)
Occupational Therapy Treatment Note   Patient Name: [name] MRN: [id number] DOB:1941/11/15, 82 y.o., female Today's Date: 04/14/2023  PCP: Dr. Judithann Castillo REFERRING PROVIDER:  Dr. Judithann Castillo  END OF SESSION:  OT End of Session - 04/14/23 1538     Visit Number 31    Number of Visits 48    Date for OT Re-Evaluation 05/26/23    OT Start Time 1515    OT Stop Time 1600    OT Time Calculation (min) 45 min    Activity Tolerance Patient tolerated treatment well    Behavior During Therapy WFL for tasks assessed/performed             Past Medical History:  Diagnosis Date   Anemia    Arthritis    Asthma    uses inhaler just prior to surgery to avoid attack   Back pain    from previous injury   Complication of anesthesia    has woken  up during 2 different surgery   Depression    no current issue/treatment; situation   Gallstones    GERD (gastroesophageal reflux disease)    Hiatal hernia    patient does NOT have nerve/muscle disease   History of kidney stones    HLD (hyperlipidemia)    HTN (hypertension)    Hypothyroidism    Kidney stones    Knee pain    Nausea and vomiting 10/15/2022   Non-diabetic pancreatic hormone dysfunction years   pt. 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   Pancreatitis    Pneumonia    Seizures (HCC)    caused by dye injected during a procedure   Shortness of breath    with exertion   Sinus problem    frequent infections/congestion   Stroke (HCC) 2021   reports having CVA in 2021 and having mini strokes before that   Thyroid disease    Past Surgical History:  Procedure Laterality Date   ABDOMINAL HYSTERECTOMY     APPENDECTOMY     CARPAL TUNNEL RELEASE  10+ years ago   bilateral   EYE SURGERY  3 yrs ago   bilateral cataracts   FOOT OSTEOTOMY  6 weeks ago   Left foot: great, 2nd & 3rd   FOOT OSTEOTOMY  5 years ago   Right great toe   HAND SURGERY Bilateral 2011-most recent   multiple hand surgeries, 2 on left, 3 on right   KNEE  ARTHROPLASTY Right 04/28/2022   Procedure: COMPUTER ASSISTED TOTAL KNEE ARTHROPLASTY;  Surgeon: Donato Heinz, MD;  Location: [facility];  Service: Orthopedics;  Laterality: Right;   LOOP RECORDER INSERTION N/A 05/16/2020   Procedure: LOOP RECORDER INSERTION;  Surgeon: Marcina Millard, MD;  Location: [facility];  Service: Cardiovascular;  Laterality: N/A;   NASAL SINUS SURGERY  most recent 7-8 yrs ago   7 sinus surgeries    TRIGGER FINGER RELEASE  11/19/2011   Procedure: RELEASE TRIGGER FINGER/A-1 PULLEY;  Surgeon: Nicki Reaper, MD;  Location: [facility];  Service: Orthopedics;  Laterality: Right;  release a-1 pulley right index finger and cyst removal   WRIST GANGLION EXCISION  1980's   right   Patient Active Problem List   Diagnosis Date Noted   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 11/27/2022   Hypomagnesemia 11/27/2022   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   HLD (hyperlipidemia) 11/23/2022   Asthma 11/23/2022   Depression with anxiety 11/23/2022   Chronic diastolic CHF (congestive heart failure) (HCC)  11/23/2022   Normocytic anemia 11/23/2022   Aspiration pneumonia (HCC) 11/23/2022   AKI (acute kidney injury) (HCC) 11/23/2022   Abdominal pain 11/23/2022   Mesenteric mass 11/23/2022   Coffee ground emesis 11/23/2022   Nausea and vomiting 10/15/2022   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   Fatigue 09/17/2022   Left hemiparesis (HCC) 09/17/2022   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HCC) 08/22/2022   GERD (gastroesophageal reflux disease) 08/21/2022   Agitation 08/20/2022   Acute left-sided weakness 08/20/2022   Expressive aphasia    Stroke (HCC) 08/19/2022   Leukocytosis 08/19/2022   History of urticaria 04/28/2022   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   Primary osteoarthritis of left knee 02/24/2022   Primary osteoarthritis of right knee 02/24/2022   Lumbar spondylolysis 04/12/2020   History of CVA (cerebrovascular accident)  03/26/2020   Low back pain radiating to right lower extremity 03/21/2020   B12 deficiency 03/06/2020   Positive anti-CCP test 12/21/2019   Arthralgia 12/13/2019   Dermatitis 12/13/2019   Rheumatoid factor positive 12/13/2019   Essential hypertension 12/11/2018   Palpitations 12/11/2018   Acquired hypothyroidism 11/10/2018   Arthritis of knee 09/17/2016   Anxiety 11/22/2014   Asthma without status asthmaticus 11/22/2014   Benign neoplasm of colon, unspecified 11/22/2014   Environmental allergies 11/22/2014   Hypertriglyceridemia 11/22/2014   Hypokalemia 11/22/2014   Personal history of disease of skin and subcutaneous tissue 11/22/2014   REFERRING DIAG: CVA   THERAPY DIAG:  Muscle weakness (generalized)   Other lack of coordination   Rationale for Evaluation and Treatment Rehabilitation   SUBJECTIVE:    Pt. Reports not sleeping last night.    SUBJECTIVE STATEMENT:   Pt accompanied by: significant other   PERTINENT HISTORY: Patient is an 82 year-old female who was admitted to [facility] on 11/23/2022 with a cerebellar CVA. 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 Pt. Was admitted to Inpatient Rehab from 12/21-1/02/2023. 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  Patient underwent inpatient rehabilitation for 2 weeks.  Patient was assessed and was scheduled for a knee replacement on 08/29/2022 however had to cancel it due to having had a CVA.  Patient had a recent fall 2 days after discharging from inpatient rehab. Past Medical History includes: Knee replacement, essential HTN, hypokalemia, leukocytosis, seizures, positive anti-- CCP test, anxiety disorder, mini strokes.  Patient had shingles with left eye nerve pain s/p 1 year ago.    PRECAUTIONS: Fall   WEIGHT BEARING RESTRICTIONS No   PAIN:  Are you having pain?  7/10 LUE Intermittent initially. Improved with ROM.   FALLS: Has patient  fallen in last 6 months? Yes. Number of falls 1   LIVING ENVIRONMENT: Lives with: Lives with Spouse Lives in: House/apartment Stairs: 2 storey home, resides on the first floor.  External: 2 stairs front no rails, and 6 in back with rails Has following equipment at home: Single point cane, Walker - 2 wheeled, Environmental consultant - 4 wheeled, Shower bench, and bed side commode   PLOF: Independent   PATIENT GOALS  To Regain the use of her left arm   OBJECTIVE:    HAND DOMINANCE: Right   ADLs: Overall ADLs: Husband assists pt. as needed Transfers/ambulation related to ADLs:Pt. Uses a 3 wheeled walker with Husband assist. Eating: Pt. Is independent with the right hand. Pt. has difficulty cutting food. Grooming: Pt. is using her right hand, however has difficulty sustaining her LUE in elevation to assist with haircare. UB Dressing: Pt.  Is independent donning a pullover shirt, and button down shirt. Has difficulty with buttoning, LB Dressing:  Independent donning pants, and socks. Difficulty tying shoes. Toileting: Independent Bathing: Pt. Is able to engage her right hand. Tub Shower transfers: Supervision Equipment: See above for equipment     IADLs: Shopping:  Has not had the opportunity for grocery shopping yet Light housekeeping: Husband is assisting with light house keeping Meal Prep:  Dependent Community mobility: Relies of family/friends Medication management: Husband assisting with weekly pillbox set-up, and administering medication. Financial management: TBD Handwriting: 75% legible   MOBILITY STATUS: Hx of falls   POSTURE COMMENTS:  No Significant postural limitations Sitting balance: supported sitting balance WFL   ACTIVITY TOLERANCE: Activity tolerance:  Fatigues in greater than 30 min.    FUNCTIONAL OUTCOME MEASURES: FOTO: 57   UPPER EXTREMITY ROM      Active ROM Right Eval: WFL Left eval Left  01/22/23 Left  03/03/23 Left  04/14/23  Shoulder flexion   132 100 108 108   Shoulder abduction   80 85 85 85  Shoulder adduction         Shoulder extension         Shoulder internal rotation         Shoulder external rotation         Elbow flexion   140 140 WFL WFL  Elbow extension   WNL WNL WFL WFL  Wrist flexion   65 68    Wrist extension   -10 20 24  32  Wrist ulnar deviation     12 10 14   Wrist radial deviation     8 14 14   Wrist pronation         Wrist supination         (Blank rows = not tested)   Left digit flexion to [facility]: 2nd: 0cm, 3rd: 0cm, 4th: 0cm, 5th: 0cm   Limited Left full 2nd digit extension     UPPER EXTREMITY MMT:      MMT Right Eval: 4+/5 overall Left Eval Left 01/22/23 Left 03/03/23 Left  04/14/2023  Shoulder flexion   3/5 3-/5 3-/5 3-/5  Shoulder abduction   3-/5 3-/5 3-/5 3-/5  Shoulder adduction         Shoulder extension         Shoulder internal rotation         Shoulder external rotation         Middle trapezius         Lower trapezius         Elbow flexion   3+/5 4/5 N/A 4/5  Elbow extension   3+/5 4/4 N/A 4/5  Wrist flexion         Wrist extension   2-/5 3-/5 3-/5 3/5  Wrist ulnar deviation         Wrist radial deviation         Wrist pronation         Wrist supination         (Blank rows = not tested)   HAND FUNCTION: Grip strength: Right: 26#, Left: 10# Pinch strength: Right 8#, Left: 3#, 3 Pt. Pinch strength: Right: 9#, L: 2#  01/22/2023 Grip strength: Right: 26#, Left: 12# Pinch strength:  Pinch meter used at the initial eval has been sent out for recalibration   03/03/2023 Grip strength: Right: 26#, Left: 13# Pinch strength:  Pinch meter used at the initial eval has been sent out for recalibration  04/14/2023: Grip  strength: Right: 26#, Left: 13# Pinch strength:   Right 8#, Left: 4#, 3 Pt. Pinch strength: Right: 9#, L: 3#     COORDINATION: Right: 22 sec., Left: <5 min. To place 7 pegs with increased compensation proximally in the trunk, and through reflexive associated reactions.  01/22/23 Right:  22 sec., Left: 3 min. & 4 sec.  03/03/23 Right: 22 sec., Left: 1 min. & 39 sec.  04/14/23   TBD   SENSA   SENSATION: Light touch: WFL, proprioceptive awareness: Intact   EDEMA: N/A   MUSCLE TONE: LUE: Hypotonic   COGNITION: Overall cognitive status: WFL for tasks assessed. Pt. Is impulsive at times.   VISION: Subjective report: Pt. report having shingles affecting left eye  s/p 1 year. Has nerve pain Baseline vision: Wears glasses for reading only Visual history:  updated see clinical impression   VISION ASSESSMENT:    WFL for tasks performed   PERCEPTION: Intact   PRAXIS: Impaired: Motor planning   OBSERVATIONS:  Pt. more alert, and engaging since prior to the most recent hospitalization.     TODAY'S TREATMENT:    Therapeutic Ex:   Pt. tolerated AROM/AAROM for the LUE. Pt. worked on [employer], and reciprocal motion using the UBE while seated for 8 min. with no resistance. Pt. performed reps on 2nd digit extension at the tabletop.     PATIENT EDUCATION: Education details: left hand digit extension Person educated: Patient and Spouse Education method: Medical illustrator Education comprehension: verbalized understanding, returned demonstration, and needs further education     HOME EXERCISE PROGRAM:    Reviewed activities at home to promote isolated 2nd digit extension.    GOALS: Goals reviewed with patient? Yes   SHORT TERM GOALS: Target date: 01/27/2023       1. Patient will be independent with home exercise program for the left upper extremity Baseline: 04/09/2023: Pt. Continues to consistently attempt to engage her hand at hand 02/01/2023: Pt. Consistently attempts to perform HEPs independently. No current home exercise program Goal status: Ongoing     LONG TERM GOALS: Target date: 05/26/2023     Patient will improve left shoulder strength by 2 mm grades to be able to sustain UE's in elevation long enough to wash her hair.  Baseline:  04/09/2023: Pt. is limited with sustaining LUE elevation long enough to perform hair care thoroughly. 03/03/2023: Left shoulder flexion: 3-/5, abduction: 3-/5 01/22/2023: Left shoulder flexion: 3-/5, abduction: 3-/5 Eval: Left shoulder flexion: 3/5, abduction: 3-/5 Goal status: Ongoing   2.  Patient will improve left shoulder active abduction to be able to comb her hair Baseline:  04/09/2023: Pt. Pt. Presents with difficulty abducting her left shoulder enough to thoroughly complete haircare. 03/03/2023: Shoulder abduction: 85 01/22/2023: Shoulder abduction: 85 Eval: Left shoulder abduction is 80(108) Goal status: Ongoing   3.  Patient will independently button her shirt with modified independence. Baseline: 04/09/2023: Pt. Continues to progress towards buttoning. 03/03/2023: Pt. Continues to have difficulty with buttoning. 01/22/2023: Pt. continues to have difficulty. Eval: Patient has difficulty.  Goal status: Ongoing   4.  Patient well improve left grip strength in preparation for securely holding flowers. Baseline:04/09/2023: Pt. Is able to hold, and hike pants with the left hand, continues to have difficulty with securely holding flowers.   03/03/2023: left grip strength: 13# 01/22/2023: Left: 12# Eval: Pt. Is unable to securely hold flowers. Goal status: Ongoing   5.  Pt. will independently recall adaptive  strategies for performing ADL tasks including: flossing teeth,  donning bra, applying makeup. Baseline: 04/09/2023: Continue3/26/2024: Continue 01/22/2023: Pt. continues to benefit from education about adaptive strategies during ADLs, and IADLs. Eval: Pt. to be provided with adaptive strategies. Goal status: Ongoing   6.  Pt. will improve Foto score by 2 points to reflect patient perceived performance improvement assessment specific ADLs  and IADLs Baseline: 1/30/865:  TBD 03/03/23: FOTO 62 Eval: 57 Goal status: Ongoing  7.  Pt. will improve left hand coordination skills in order to be able to handle,  and sort utensils in a drawer.     Baseline:  04/14/2023: 56 04/09/2023: TBD 03/03/23: 1 min. & 39 01/22/2022: Left: 3 min. & 4 sec. Eval: Pt. has difficulty sorting, and placing utensils with the left hand. Left FMC : >5 min. For 7 pegs on the 9 hole peg test.    Goal Status:  Ongoing  8. Pt. will improve active left 2nd digit extension to be able able to isolate her 2nd digit in preparation for pressing/pushing buttons on appliances, phones, or remotes. Baseline: 04/09/2023: Pt. Is progressing with isolating left 2nd digit extension, however continues to present with limited increased flexor tone. 03/03/23: Pt. Continues to work on improving consistency with 2nd digit extension to press the remote. 01/22/2023: is able to perform full digit extension, although 2nd digit is slow to extend s 2/2 flexor tone. Pt.  Eval: Pt. is able to is unable to actively perform full digit extension Goal status:  Ongoing   ASSESSMENT:   CLINICAL IMPRESSION:   Pt. is making progress with ROM in left wrist extension, and ulnar deviation, as well as left pinch strength. Pt. is making progress with LUE hand function skills and is engaging her hand more during daily tasks at home. Pt. is using her left hand to hold, and hike her pants. Pt. Continues to work on improving isolated 2nd digit extension in preparation for improving prehension patterns. Pt continues to work on improving L shoulder, elbow, and wrist strength, improving motor control and coordination in order to prepare the left upper extremity and hand for functional reaching and engaging the upper left extremity when performing hair care, buttoning, holding flowers, cutting food, and manipulating utensils to set the table as well as adaptive strategies during daily IADL care.   PERFORMANCE DEFICITS in functional skills including ADLs, IADLs, coordination, proprioception, ROM, strength, FMC, and GMC, cognitive skills including memory, and psychosocial skills including  coping strategies, environmental adaptation, interpersonal interactions, and routines and behaviors.    IMPAIRMENTS are limiting patient from ADLs, IADLs, education, leisure, and social participation.    COMORBIDITIES may have co-morbidities  that affects occupational performance. Patient will benefit from skilled OT to address above impairments and improve overall function.   MODIFICATION OR ASSISTANCE TO COMPLETE EVALUATION: Min-Moderate modification of tasks or assist with assess necessary to complete an evaluation.   OT OCCUPATIONAL PROFILE AND HISTORY: Detailed assessment: Review of records and additional review of physical, cognitive, psychosocial history related to current functional performance.   CLINICAL DECISION MAKING: Moderate - several treatment options, min-mod task modification necessary   REHAB POTENTIAL: Good   EVALUATION COMPLEXITY: Moderate      PLAN: OT FREQUENCY: 2x/week   OT DURATION: 12 weeks   PLANNED INTERVENTIONS: self care/ADL training, therapeutic exercise, therapeutic activity, neuromuscular re-education, manual therapy, passive range of motion, functional mobility training, electrical stimulation, and paraffin   RECOMMENDED OTHER SERVICES: PT   CONSULTED AND AGREED WITH PLAN OF CARE: Patient and family member/caregiver   PLAN FOR NEXT  SESSION: Initiate OT treatment  Olegario Messier, MS, OTR/L   04/02/2023

## 2023-04-15 ENCOUNTER — Encounter: Payer: Medicare HMO | Admitting: Physical Medicine and Rehabilitation

## 2023-04-16 ENCOUNTER — Ambulatory Visit: Payer: Medicare HMO | Admitting: Occupational Therapy

## 2023-04-21 ENCOUNTER — Ambulatory Visit: Payer: Medicare HMO | Admitting: Occupational Therapy

## 2023-04-21 ENCOUNTER — Ambulatory Visit: Payer: Medicare HMO | Admitting: Physical Therapy

## 2023-04-21 NOTE — Therapy (Deleted)
OUTPATIENT PHYSICAL THERAPY NEURO TREATMENT   Patient Name: [name] MRN: [id number] DOB:05-May-1941, 82 y.o., female Today's Date: 04/21/2023   PCP: Aram Beecham, D MD REFERRING PROVIDER: Jacquelynn Cree PA   END OF SESSION:    Past Medical History:  Diagnosis Date   Anemia    Arthritis    Asthma    uses inhaler just prior to surgery to avoid attack   Back pain    from previous injury   Complication of anesthesia    has woken  up during 2 different surgery   Depression    no current issue/treatment; situation   Gallstones    GERD (gastroesophageal reflux disease)    Hiatal hernia    patient does NOT have nerve/muscle disease   History of kidney stones    HLD (hyperlipidemia)    HTN (hypertension)    Hypothyroidism    Kidney stones    Knee pain    Nausea and vomiting 10/15/2022   Non-diabetic pancreatic hormone dysfunction years   pt. 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   Pancreatitis    Pneumonia    Seizures (HCC)    caused by dye injected during a procedure   Shortness of breath    with exertion   Sinus problem    frequent infections/congestion   Stroke (HCC) 2021   reports having CVA in 2021 and having mini strokes before that   Thyroid disease    Past Surgical History:  Procedure Laterality Date   ABDOMINAL HYSTERECTOMY     APPENDECTOMY     CARPAL TUNNEL RELEASE  10+ years ago   bilateral   EYE SURGERY  3 yrs ago   bilateral cataracts   FOOT OSTEOTOMY  6 weeks ago   Left foot: great, 2nd & 3rd   FOOT OSTEOTOMY  5 years ago   Right great toe   HAND SURGERY Bilateral 2011-most recent   multiple hand surgeries, 2 on left, 3 on right   KNEE ARTHROPLASTY Right 04/28/2022   Procedure: COMPUTER ASSISTED TOTAL KNEE ARTHROPLASTY;  Surgeon: Donato Heinz, MD;  Location: [facility];  Service: Orthopedics;  Laterality: Right;   LOOP RECORDER INSERTION N/A 05/16/2020   Procedure: LOOP RECORDER INSERTION;  Surgeon: Marcina Millard, MD;   Location: [facility];  Service: Cardiovascular;  Laterality: N/A;   NASAL SINUS SURGERY  most recent 7-8 yrs ago   7 sinus surgeries    TRIGGER FINGER RELEASE  11/19/2011   Procedure: RELEASE TRIGGER FINGER/A-1 PULLEY;  Surgeon: Nicki Reaper, MD;  Location: [facility];  Service: Orthopedics;  Laterality: Right;  release a-1 pulley right index finger and cyst removal   WRIST GANGLION EXCISION  1980's   right   Patient Active Problem List   Diagnosis Date Noted   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 11/27/2022   Hypomagnesemia 11/27/2022   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   HLD (hyperlipidemia) 11/23/2022   Asthma 11/23/2022   Depression with anxiety 11/23/2022   Chronic diastolic CHF (congestive heart failure) (HCC) 11/23/2022   Normocytic anemia 11/23/2022   Aspiration pneumonia (HCC) 11/23/2022   AKI (acute kidney injury) (HCC) 11/23/2022   Abdominal pain 11/23/2022   Mesenteric mass 11/23/2022   Coffee ground emesis 11/23/2022   Nausea and vomiting 10/15/2022   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   Fatigue 09/17/2022   Left hemiparesis (HCC) 09/17/2022   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HCC) 08/22/2022   GERD (gastroesophageal reflux disease) 08/21/2022   Agitation  08/20/2022   Acute left-sided weakness 08/20/2022   Expressive aphasia    Stroke (HCC) 08/19/2022   Leukocytosis 08/19/2022   History of urticaria 04/28/2022   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   Primary osteoarthritis of left knee 02/24/2022   Primary osteoarthritis of right knee 02/24/2022   Lumbar spondylolysis 04/12/2020   History of CVA (cerebrovascular accident) 03/26/2020   Low back pain radiating to right lower extremity 03/21/2020   B12 deficiency 03/06/2020   Positive anti-CCP test 12/21/2019   Arthralgia 12/13/2019   Dermatitis 12/13/2019   Rheumatoid factor positive 12/13/2019   Essential hypertension 12/11/2018   Palpitations 12/11/2018    Acquired hypothyroidism 11/10/2018   Arthritis of knee 09/17/2016   Anxiety 11/22/2014   Asthma without status asthmaticus 11/22/2014   Benign neoplasm of colon, unspecified 11/22/2014   Environmental allergies 11/22/2014   Hypertriglyceridemia 11/22/2014   Hypokalemia 11/22/2014   Personal history of disease of skin and subcutaneous tissue 11/22/2014    ONSET DATE: 11/23/22  REFERRING DIAG: TIA  THERAPY DIAG:  No diagnosis found.  Rationale for Evaluation and Treatment: Rehabilitation  SUBJECTIVE:                                                                                                                                                                                             SUBJECTIVE STATEMENT: Patient presents with husband s/p CVA.  Pt accompanied by: significant other  PERTINENT HISTORY: Patient is an 82 year-old female who was admitted to [facility] on 11/23/2022 with a cerebellar CVA. 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 Pt. Was admitted to Inpatient Rehab from 12/21-1/02/2023. 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  Patient underwent inpatient rehabilitation for 2 weeks.  Patient was assessed and was scheduled for a knee replacement on 08/29/2022 however had to cancel it due to having had a CVA.   PMH includes: anemia, arthritis, back pain, depression, hernia, GERD, HLD, HTN, hypothyroidism, knee pain, pancreatic hormone dysfunction, pneumonia, seizures, stroke, thyroid disease.  Patient has a donjoy brace for L knee. Walking with hand hold help with husband.   PAIN:  Are you having pain? Yes: NPRS scale: 9-10/10 Pain location: L knee Pain description: aching, gives out Aggravating factors: climbing into pain, moving it, walking around house Relieving factors: rest   PRECAUTIONS: Fall  WEIGHT BEARING RESTRICTIONS: No  FALLS: Has patient fallen in last 6 months? Yes. Number  of falls 4 falls   LIVING ENVIRONMENT: Lives with: lives with their family and lives with their spouse Lives in: House/apartment Stairs: Yes: Internal: flight steps;   and External:  2 steps; on right going up and on left going up Has following equipment at home: Single point cane, Walker - 2 wheeled, Shower bench, and bed side commode  PLOF: Independent  PATIENT GOALS: to have less pain and move better  OBJECTIVE:   DIAGNOSTIC FINDINGS:  Disc levels:   C2-3: No significant stenosis.   C3-4: A rightward disc osteophyte complex effaces the ventral CSF. Severe left and moderate right foraminal stenosis is present.   C4-5: A broad-based disc osteophyte complex effaces the ventral CSF. The canal is narrowed 7 mm. Severe left and moderate right foraminal stenosis is present.   C5-6: Chronic loss of disc height is present. Broad-based disc osteophyte complex is asymmetric to the right. Partial effacement of the ventral CSF is present. Moderate foraminal stenosis is worse right than left.   C6-7: A broad-based disc osteophyte complex is present. This partially effaces the ventral CSF. Moderate foraminal narrowing is worse right than left.   C7-T1: Uncovertebral spurring is present bilaterally. Mild bilateral foraminal stenosis is present.   IMPRESSION: 1. Multilevel spondylosis of the cervical spine as described. 2. 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 3. 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 4. No other significant cord signal abnormality to suggest ischemic changes to the cord. 5. Severe left and moderate right foraminal stenosis at C3-4 and C4-5. 6. Moderate foraminal stenosis bilaterally at C5-6 and C6-7 is worse on the right. 7. Mild bilateral foraminal narrowing at C7-T1. 8. Prominent soft tissue pannus at C1-2 effaces the ventral CSF. This is most likely related to  rheumatoid arthritis.  IMPRESSION: 1. 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 2. 9 mm right occipital pole infarct. 3. Punctate white matter infarct in the left occipital lobe. 4. Additional punctate cortical infarct in the medial right occipital lobe more superiorly. 5. Expected evolution of previous right posterior frontal lobe white matter infarct. 6. Stable atrophy and white matter disease likely reflects the sequela of chronic microvascular ischemia.  COGNITION: Overall cognitive status:  two strokes   SENSATION: WFL  COORDINATION: Heel slide test limited by LLE pain  POSTURE: rounded shoulders, forward head, and flexed trunk   LOWER EXTREMITY ROM:     Bowing of L knee  LOWER EXTREMITY MMT:    MMT Right Eval Left Eval  Hip flexion 3 3*  Hip extension    Hip abduction 3 3*  Hip adduction 3 *  Hip internal rotation    Hip external rotation    Knee flexion 3 3*  Knee extension 3 3*  Ankle dorsiflexion 4 4  Ankle plantarflexion 4 4  (Blank rows = not tested) * pain  BED MOBILITY:  Per patient she has difficulty, occasionally needs help  TRANSFERS: Assistive device utilized: None  Sit to stand: CGA and Min A Stand to sit: Min A Chair to chair: CGA    GAIT: Gait pattern: step to pattern, decreased stance time- Left, genu recurvatum- Left, and shuffling Distance walked: 10 ft Assistive device utilized: None Level of assistance: CGA Comments: Patient is very painful with ambulation, unable to ambulate longer duration   FUNCTIONAL TESTS:  5 times sit to stand: unable to tolerate 10 meter walk test: unable to tolerate  PATIENT SURVEYS:  FOTO 47  TODAY'S TREATMENT:  DATE: 04/21/23   seated and supine strengthening of LLE   Exercise/Activity  Sets/Reps/Time/ Resistance Assistance Charge type Comments  Seated LAQ      Seated march       Seated adduction       Seated clam       Seated heel toe raises       Supine heel slides      Supine hip abduction       Glute sets                               Treatment Provided this session   Rationale for Evaluation and Treatment Rehabilitation  Pt educated throughout session about proper posture and technique with exercises. Improved exercise technique, movement at target joints, use of target muscles after min to mod verbal, visual, tactile cues. Note: Portions of this document were prepared using Dragon voice recognition software and although reviewed may contain unintentional dictation errors in syntax, grammar, or spelling.   PATIENT EDUCATION: Education details: goals, POC Person educated: Patient and Spouse Education method: Explanation, Demonstration, Tactile cues, and Verbal cues Education comprehension: verbalized understanding, returned demonstration, verbal cues required, and tactile cues required  HOME EXERCISE PROGRAM: Access Code: [email or web address] URL: [email or web address] Date: 04/14/2023 Prepared by: Precious Bard  Exercises - Seated Single Leg Hip Abduction  - 1 x daily - 7 x weekly - 2 sets - 10 reps - 5 hold - Seated Heel Slide  - 1 x daily - 7 x weekly - 2 sets - 10 reps - 5 hold - Seated Heel Toe Raises  - 1 x daily - 7 x weekly - 2 sets - 10 reps - 5 hold  GOALS: Goals reviewed with patient? Yes  SHORT TERM GOALS: Target date: 05/12/2023    Patient will be independent in home exercise program to improve strength/mobility for better functional independence with ADLs. Baseline: 5/7:  Goal status: INITIAL    LONG TERM GOALS: Target date: 07/07/2023    Patient will increase FOTO score to equal to or greater than   57%  to demonstrate statistically significant improvement in mobility and quality of life.  Baseline:  47% Goal status:  INITIAL  2.  Patient (> 81 years old) will complete five times sit to stand test in < 15 seconds indicating an increased LE strength and improved balance. Baseline: 5/7: unable to tolerate test Goal status: INITIAL  3.   Patient will deny any falls over past 4 weeks to demonstrate improved safety awareness at home and work.  Baseline: 5/7: multiple falls Goal status: INITIAL  4.  Patient will increase BLE gross strength to 4+/5 as to improve functional strength for independent gait, increased standing tolerance and increased ADL ability Baseline: 5/7: see above  Goal status: INITIAL  5.  Patient will be able to perform household work/ chores without increase in symptoms. Baseline: 5/7: unable to perform  Goal status: INITIAL    ASSESSMENT:  CLINICAL IMPRESSION: Patient is an 82 y.o. female who was seen today for physical therapy evaluation and treatment for CVA. Patient's L knee is primary limiter for mobility with extreme pain levels in standing position. She is limited with ambulation due to pain and has had multiple falls. Physical therapy will assist with strengthening of LLE to reduce instability and improve mobility. Patient educated on plan and is agreeable to attempt gentle LE strengthening and stability interventions. Patient will benefit from skilled physical  therapy to reduce pain, improve mobility, improve strength and improve quality of life.    OBJECTIVE IMPAIRMENTS: Abnormal gait, cardiopulmonary status limiting activity, decreased activity tolerance, decreased balance, decreased cognition, decreased endurance, decreased knowledge of use of DME, decreased mobility, difficulty walking, decreased ROM, decreased strength, decreased safety awareness, hypomobility, increased fascial restrictions, impaired perceived functional ability, impaired flexibility, impaired UE functional use, improper body mechanics, postural dysfunction, and pain.   ACTIVITY LIMITATIONS: carrying,  lifting, bending, sitting, standing, squatting, stairs, transfers, bed mobility, bathing, toileting, dressing, reach over head, hygiene/grooming, locomotion level, and caring for others  PARTICIPATION LIMITATIONS: meal prep, cleaning, laundry, medication management, personal finances, interpersonal relationship, driving, shopping, community activity, yard work, school, and church  PERSONAL FACTORS: Age, Behavior pattern, Education, Fitness, Past/current experiences, Time since onset of injury/illness/exacerbation, and 3+ comorbidities: anemia, arthritis, back pain, depression, hernia, GERD, HLD, HTN, hypothyroidism, knee pain, pancreatic hormone dysfunction, pneumonia, seizures, stroke, thyroid disease  are also affecting patient's functional outcome.   REHAB POTENTIAL: Fair    CLINICAL DECISION MAKING: Evolving/moderate complexity  EVALUATION COMPLEXITY: Moderate  PLAN:  PT FREQUENCY: 1-2x/week  PT DURATION: 12 weeks  PLANNED INTERVENTIONS: Therapeutic exercises, Therapeutic activity, Neuromuscular re-education, Balance training, Gait training, Patient/Family education, Self Care, Joint mobilization, Joint manipulation, Stair training, Vestibular training, Canalith repositioning, Visual/preceptual remediation/compensation, Orthotic/Fit training, DME instructions, Electrical stimulation, Wheelchair mobility training, Spinal mobilization, Cryotherapy, Moist heat, Compression bandaging, scar mobilization, Splintting, Taping, Traction, Ultrasound, Manual therapy, and Re-evaluation  PLAN FOR NEXT SESSION: seated and supine strengthening of LLE    Norman Herrlich, PT 04/21/2023, 2:57 PM

## 2023-04-22 NOTE — Therapy (Signed)
OUTPATIENT PHYSICAL THERAPY NEURO TREATMENT   Patient Name: [name] MRN: [id number] DOB:[birth year], 82 y.o., female Today's Date: 04/23/2023   PCP: Aram Beecham, D MD REFERRING PROVIDER: Jacquelynn Cree PA   END OF SESSION:  PT End of Session - 04/23/23 1427     Visit Number 2    Number of Visits 24    Date for PT Re-Evaluation 07/07/23    Authorization Type 2/10 eval 04/14/23    PT Start Time 1430    PT Stop Time 1513    PT Time Calculation (min) 43 min    Equipment Utilized During Treatment Gait belt    Activity Tolerance Patient limited by pain    Behavior During Therapy WFL for tasks assessed/performed              Past Medical History:  Diagnosis Date   Anemia    Arthritis    Asthma    uses inhaler just prior to surgery to avoid attack   Back pain    from previous injury   Complication of anesthesia    has woken  up during 2 different surgery   Depression    no current issue/treatment; situation   Gallstones    GERD (gastroesophageal reflux disease)    Hiatal hernia    patient does NOT have nerve/muscle disease   History of kidney stones    HLD (hyperlipidemia)    HTN (hypertension)    Hypothyroidism    Kidney stones    Knee pain    Nausea and vomiting 10/15/2022   Non-diabetic pancreatic hormone dysfunction years   pt. 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   Pancreatitis    Pneumonia    Seizures (HCC)    caused by dye injected during a procedure   Shortness of breath    with exertion   Sinus problem    frequent infections/congestion   Stroke (HCC) 2021   reports having CVA in 2021 and having mini strokes before that   Thyroid disease    Past Surgical History:  Procedure Laterality Date   ABDOMINAL HYSTERECTOMY     APPENDECTOMY     CARPAL TUNNEL RELEASE  10+ years ago   bilateral   EYE SURGERY  3 yrs ago   bilateral cataracts   FOOT OSTEOTOMY  6 weeks ago   Left foot: great, 2nd & 3rd   FOOT OSTEOTOMY  5 years ago   Right  great toe   HAND SURGERY Bilateral 2011-most recent   multiple hand surgeries, 2 on left, 3 on right   KNEE ARTHROPLASTY Right 04/28/2022   Procedure: COMPUTER ASSISTED TOTAL KNEE ARTHROPLASTY;  Surgeon: Donato Heinz, MD;  Location: [facility];  Service: Orthopedics;  Laterality: Right;   LOOP RECORDER INSERTION N/A 05/16/2020   Procedure: LOOP RECORDER INSERTION;  Surgeon: Marcina Millard, MD;  Location: [facility];  Service: Cardiovascular;  Laterality: N/A;   NASAL SINUS SURGERY  most recent 7-8 yrs ago   7 sinus surgeries    TRIGGER FINGER RELEASE  11/19/2011   Procedure: RELEASE TRIGGER FINGER/A-1 PULLEY;  Surgeon: Nicki Reaper, MD;  Location: [facility];  Service: Orthopedics;  Laterality: Right;  release a-1 pulley right index finger and cyst removal   WRIST GANGLION EXCISION  1980's   right   Patient Active Problem List   Diagnosis Date Noted   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 11/27/2022   Hypomagnesemia 11/27/2022   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  HLD (hyperlipidemia) 11/23/2022   Asthma 11/23/2022   Depression with anxiety 11/23/2022   Chronic diastolic CHF (congestive heart failure) (HCC) 11/23/2022   Normocytic anemia 11/23/2022   Aspiration pneumonia (HCC) 11/23/2022   AKI (acute kidney injury) (HCC) 11/23/2022   Abdominal pain 11/23/2022   Mesenteric mass 11/23/2022   Coffee ground emesis 11/23/2022   Nausea and vomiting 10/15/2022   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   Fatigue 09/17/2022   Left hemiparesis (HCC) 09/17/2022   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HCC) 08/22/2022   GERD (gastroesophageal reflux disease) 08/21/2022   Agitation 08/20/2022   Acute left-sided weakness 08/20/2022   Expressive aphasia    Stroke (HCC) 08/19/2022   Leukocytosis 08/19/2022   History of urticaria 04/28/2022   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   Primary osteoarthritis of left knee 02/24/2022   Primary  osteoarthritis of right knee 02/24/2022   Lumbar spondylolysis 04/12/2020   History of CVA (cerebrovascular accident) 03/26/2020   Low back pain radiating to right lower extremity 03/21/2020   B12 deficiency 03/06/2020   Positive anti-CCP test 12/21/2019   Arthralgia 12/13/2019   Dermatitis 12/13/2019   Rheumatoid factor positive 12/13/2019   Essential hypertension 12/11/2018   Palpitations 12/11/2018   Acquired hypothyroidism 11/10/2018   Arthritis of knee 09/17/2016   Anxiety 11/22/2014   Asthma without status asthmaticus 11/22/2014   Benign neoplasm of colon, unspecified 11/22/2014   Environmental allergies 11/22/2014   Hypertriglyceridemia 11/22/2014   Hypokalemia 11/22/2014   Personal history of disease of skin and subcutaneous tissue 11/22/2014    ONSET DATE: 11/23/22  REFERRING DIAG: TIA  THERAPY DIAG:  Muscle weakness (generalized)  Difficulty in walking, not elsewhere classified  Unsteadiness on feet  Rationale for Evaluation and Treatment: Rehabilitation  SUBJECTIVE:                                                                                                                                                                                             SUBJECTIVE STATEMENT: Patient missed last session due to a scheduling mishap.  Pt accompanied by: [name]  PERTINENT HISTORY: Patient is an 82 year-old female who was admitted to [facility] on 11/23/2022 with a cerebellar CVA. 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 Pt. Was admitted to Inpatient Rehab from 12/21-1/02/2023. 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  Patient underwent inpatient rehabilitation for 2 weeks.  Patient was assessed and was scheduled for a knee replacement on 08/29/2022 however had to cancel it due to having had a CVA.   PMH includes: anemia, arthritis, back pain, depression, hernia, GERD, HLD, HTN,  hypothyroidism,  knee pain, pancreatic hormone dysfunction, pneumonia, seizures, stroke, thyroid disease.  Patient has a donjoy brace for L knee. Walking with hand hold help with husband.   PAIN:  Are you having pain? Yes: NPRS scale: 9-10/10 Pain location: L knee Pain description: aching, gives out Aggravating factors: climbing into pain, moving it, walking around house Relieving factors: rest   PRECAUTIONS: Fall  WEIGHT BEARING RESTRICTIONS: No  FALLS: Has patient fallen in last 6 months? Yes. Number of falls 4 falls   LIVING ENVIRONMENT: Lives with: lives with their family and lives with their spouse Lives in: House/apartment Stairs: Yes: Internal: flight steps;   and External: 2 steps; on right going up and on left going up Has following equipment at home: Single point cane, Walker - 2 wheeled, Shower bench, and bed side commode  PLOF: Independent  PATIENT GOALS: to have less pain and move better  OBJECTIVE:   DIAGNOSTIC FINDINGS:  Disc levels:   C2-3: No Stephanie stenosis.   C3-4: A rightward disc osteophyte complex effaces the ventral CSF. Severe left and moderate right foraminal stenosis is present.   C4-5: A broad-based disc osteophyte complex effaces the ventral CSF. The canal is narrowed 7 mm. Severe left and moderate right foraminal stenosis is present.   C5-6: Chronic loss of disc height is present. Broad-based disc osteophyte complex is asymmetric to the right. Partial effacement of the ventral CSF is present. Moderate foraminal stenosis is worse right than left.   C6-7: A broad-based disc osteophyte complex is present. This partially effaces the ventral CSF. Moderate foraminal narrowing is worse right than left.   C7-T1: Uncovertebral spurring is present bilaterally. Mild bilateral foraminal stenosis is present.   IMPRESSION: 1. Multilevel spondylosis of the cervical spine as described. 2. Linear T2 hyperintensity along the right side of the cord  at C4-5, likely related to chronic myelomalacia with adjacent disc disease. 3. 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 4. No Castillo Stephanie cord signal abnormality to suggest ischemic changes to the cord. 5. Severe left and moderate right foraminal stenosis at C3-4 and C4-5. 6. Moderate foraminal stenosis bilaterally at C5-6 and C6-7 is worse on the right. 7. Mild bilateral foraminal narrowing at C7-T1. 8. Prominent soft tissue pannus at C1-2 effaces the ventral CSF. 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  IMPRESSION: 1. 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 2. 9 mm right occipital pole infarct. 3. Punctate white matter infarct in the left occipital lobe. 4. Additional punctate cortical infarct in the medial right occipital lobe more superiorly. 5. Expected evolution of previous right posterior frontal lobe white matter infarct. 6. Stable atrophy and white matter disease likely reflects the sequela of chronic microvascular ischemia.  COGNITION: Overall cognitive status:  two strokes   SENSATION: WFL  COORDINATION: Heel slide test limited by LLE pain  POSTURE: rounded shoulders, forward head, and flexed trunk   LOWER EXTREMITY ROM:     Bowing of L knee  LOWER EXTREMITY MMT:    MMT Right Eval Left Eval  Hip flexion 3 3*  Hip extension    Hip abduction 3 3*  Hip adduction 3 *  Hip internal rotation    Hip external rotation    Knee flexion 3 3*  Knee extension 3 3*  Ankle dorsiflexion 4 4  Ankle plantarflexion 4 4  (Blank rows = not tested) * pain  BED MOBILITY:  Per patient she has difficulty, occasionally needs help  TRANSFERS: Assistive  device utilized: None  Sit to stand: CGA and Min A Stand to sit: Min A Chair to chair: CGA    GAIT: Gait pattern: step to pattern, decreased stance time- Left, genu recurvatum- Left, and shuffling Distance walked: 10  ft Assistive device utilized: None Level of assistance: CGA Comments: Patient is very painful with ambulation, unable to ambulate longer duration   FUNCTIONAL TESTS:  5 times sit to stand: unable to tolerate 10 meter walk test: unable to tolerate  PATIENT SURVEYS:  FOTO 47  TODAY'S TREATMENT:                                                                                                                              DATE: 04/23/23   TherEx:  Supine: SAQ 10x ; 2 sets Glute squeeze 10x; x 2 sets  Abduction RTB 10x; x 2 sets  Adduction ball squeeze 10x x 2 sets   Seated: LAQ 10x; x 2 sets Heel toe raise 15x x2 sets  Isometric press into PT foot 10x 5 second holds   PATIENT EDUCATION: Education details: goals, POC Person educated: Patient and Spouse Education method: Explanation, Demonstration, Tactile cues, and Verbal cues Education comprehension: verbalized understanding, returned demonstration, verbal cues required, and tactile cues required  HOME EXERCISE PROGRAM: Access Code: [email or web address] URL: [email or web address] Date: 04/14/2023 Prepared by: Precious Bard  Exercises - Seated Single Leg Hip Abduction  - 1 x daily - 7 x weekly - 2 sets - 10 reps - 5 hold - Seated Heel Slide  - 1 x daily - 7 x weekly - 2 sets - 10 reps - 5 hold - Seated Heel Toe Raises  - 1 x daily - 7 x weekly - 2 sets - 10 reps - 5 hold  GOALS: Goals reviewed with patient? Yes  SHORT TERM GOALS: Target date: 05/12/2023    Patient will be independent in home exercise program to improve strength/mobility for better functional independence with ADLs. Baseline: 5/7:  Goal status: INITIAL    LONG TERM GOALS: Target date: 07/07/2023    Patient will increase FOTO score to equal to or greater than   57%  to demonstrate statistically Stephanie improvement in mobility and quality of life.  Baseline:  47% Goal status: INITIAL  2.  Patient (> 11 years old) will complete five times sit  to stand test in < 15 seconds indicating an increased LE strength and improved balance. Baseline: 5/7: unable to tolerate test Goal status: INITIAL  3.   Patient will deny any falls over past 4 weeks to demonstrate improved safety awareness at home and work.  Baseline: 5/7: multiple falls Goal status: INITIAL  4.  Patient will increase BLE gross strength to 4+/5 as to improve functional strength for independent gait, increased standing tolerance and increased ADL ability Baseline: 5/7: see above  Goal status: INITIAL  5.  Patient will be able to perform household work/ chores without increase in symptoms. Baseline: 5/7:  unable to perform  Goal status: INITIAL    ASSESSMENT:  CLINICAL IMPRESSION: Patient tolerates supine and seated exercises well with no pain increase. She is highly motivated for pain free strengthening. SAQ over small bolster is nonpainful for patient at this time and is an excellent avenue for quad strengthening.  Patient will benefit from skilled physical therapy to reduce pain, improve mobility, improve strength and improve quality of life.    OBJECTIVE IMPAIRMENTS: Abnormal gait, cardiopulmonary status limiting activity, decreased activity tolerance, decreased balance, decreased cognition, decreased endurance, decreased knowledge of use of DME, decreased mobility, difficulty walking, decreased ROM, decreased strength, decreased safety awareness, hypomobility, increased fascial restrictions, impaired perceived functional ability, impaired flexibility, impaired UE functional use, improper body mechanics, postural dysfunction, and pain.   ACTIVITY LIMITATIONS: carrying, lifting, bending, sitting, standing, squatting, stairs, transfers, bed mobility, bathing, toileting, dressing, reach over head, hygiene/grooming, locomotion level, and caring for others  PARTICIPATION LIMITATIONS: meal prep, cleaning, laundry, medication management, personal finances, interpersonal  relationship, driving, shopping, community activity, yard work, school, and church  PERSONAL FACTORS: Age, Behavior pattern, Education, Fitness, Past/current experiences, Time since onset of injury/illness/exacerbation, and 3+ comorbidities: anemia, arthritis, back pain, depression, hernia, GERD, HLD, HTN, hypothyroidism, knee pain, pancreatic hormone dysfunction, pneumonia, seizures, stroke, thyroid disease  are also affecting patient's functional outcome.   REHAB POTENTIAL: Fair    CLINICAL DECISION MAKING: Evolving/moderate complexity  EVALUATION COMPLEXITY: Moderate  PLAN:  PT FREQUENCY: 1-2x/week  PT DURATION: 12 weeks  PLANNED INTERVENTIONS: Therapeutic exercises, Therapeutic activity, Neuromuscular re-education, Balance training, Gait training, Patient/Family education, Self Care, Joint mobilization, Joint manipulation, Stair training, Vestibular training, Canalith repositioning, Visual/preceptual remediation/compensation, Orthotic/Fit training, DME instructions, Electrical stimulation, Wheelchair mobility training, Spinal mobilization, Cryotherapy, Moist heat, Compression bandaging, scar mobilization, Splintting, Taping, Traction, Ultrasound, Manual therapy, and Re-evaluation  PLAN FOR NEXT SESSION: seated and supine strengthening of LLE    Precious Bard, PT 04/23/2023, 3:14 PM

## 2023-04-23 ENCOUNTER — Ambulatory Visit: Payer: Medicare HMO

## 2023-04-23 ENCOUNTER — Ambulatory Visit: Payer: Medicare HMO | Admitting: Occupational Therapy

## 2023-04-23 DIAGNOSIS — R2681 Unsteadiness on feet: Secondary | ICD-10-CM | POA: Diagnosis not present

## 2023-04-23 DIAGNOSIS — R262 Difficulty in walking, not elsewhere classified: Secondary | ICD-10-CM

## 2023-04-23 DIAGNOSIS — M6281 Muscle weakness (generalized): Secondary | ICD-10-CM

## 2023-04-23 DIAGNOSIS — R278 Other lack of coordination: Secondary | ICD-10-CM

## 2023-04-23 NOTE — Therapy (Signed)
Occupational Therapy Treatment Note   Patient Name: [name] MRN: [id number] DOB:Feb 01, 1941, 82 y.o., female Today's Date: 04/23/2023  PCP: Dr. Judithann Sheen REFERRING PROVIDER:  Dr. Judithann Sheen  END OF SESSION:  OT End of Session - 04/23/23 2234     Visit Number 32    Number of Visits 48    Date for OT Re-Evaluation 05/26/23    OT Start Time 1515    OT Stop Time 1600    OT Time Calculation (min) 45 min    Activity Tolerance Patient tolerated treatment well    Behavior During Therapy WFL for tasks assessed/performed             Past Medical History:  Diagnosis Date   Anemia    Arthritis    Asthma    uses inhaler just prior to surgery to avoid attack   Back pain    from previous injury   Complication of anesthesia    has woken  up during 2 different surgery   Depression    no current issue/treatment; situation   Gallstones    GERD (gastroesophageal reflux disease)    Hiatal hernia    patient does NOT have nerve/muscle disease   History of kidney stones    HLD (hyperlipidemia)    HTN (hypertension)    Hypothyroidism    Kidney stones    Knee pain    Nausea and vomiting 10/15/2022   Non-diabetic pancreatic hormone dysfunction years   pt. 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   Pancreatitis    Pneumonia    Seizures (HCC)    caused by dye injected during a procedure   Shortness of breath    with exertion   Sinus problem    frequent infections/congestion   Stroke (HCC) 2021   reports having CVA in 2021 and having mini strokes before that   Thyroid disease    Past Surgical History:  Procedure Laterality Date   ABDOMINAL HYSTERECTOMY     APPENDECTOMY     CARPAL TUNNEL RELEASE  10+ years ago   bilateral   EYE SURGERY  3 yrs ago   bilateral cataracts   FOOT OSTEOTOMY  6 weeks ago   Left foot: great, 2nd & 3rd   FOOT OSTEOTOMY  5 years ago   Right great toe   HAND SURGERY Bilateral 2011-most recent   multiple hand surgeries, 2 on left, 3 on right   KNEE  ARTHROPLASTY Right 04/28/2022   Procedure: COMPUTER ASSISTED TOTAL KNEE ARTHROPLASTY;  Surgeon: Donato Heinz, MD;  Location: [facility];  Service: Orthopedics;  Laterality: Right;   LOOP RECORDER INSERTION N/A 05/16/2020   Procedure: LOOP RECORDER INSERTION;  Surgeon: Marcina Millard, MD;  Location: [facility];  Service: Cardiovascular;  Laterality: N/A;   NASAL SINUS SURGERY  most recent 7-8 yrs ago   7 sinus surgeries    TRIGGER FINGER RELEASE  11/19/2011   Procedure: RELEASE TRIGGER FINGER/A-1 PULLEY;  Surgeon: Nicki Reaper, MD;  Location: [facility];  Service: Orthopedics;  Laterality: Right;  release a-1 pulley right index finger and cyst removal   WRIST GANGLION EXCISION  1980's   right   Patient Active Problem List   Diagnosis Date Noted   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 11/27/2022   Hypomagnesemia 11/27/2022   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   HLD (hyperlipidemia) 11/23/2022   Asthma 11/23/2022   Depression with anxiety 11/23/2022   Chronic diastolic CHF (congestive heart failure) (HCC)  11/23/2022   Normocytic anemia 11/23/2022   Aspiration pneumonia (HCC) 11/23/2022   AKI (acute kidney injury) (HCC) 11/23/2022   Abdominal pain 11/23/2022   Mesenteric mass 11/23/2022   Coffee ground emesis 11/23/2022   Nausea and vomiting 10/15/2022   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   Fatigue 09/17/2022   Left hemiparesis (HCC) 09/17/2022   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HCC) 08/22/2022   GERD (gastroesophageal reflux disease) 08/21/2022   Agitation 08/20/2022   Acute left-sided weakness 08/20/2022   Expressive aphasia    Stroke (HCC) 08/19/2022   Leukocytosis 08/19/2022   History of urticaria 04/28/2022   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   Primary osteoarthritis of left knee 02/24/2022   Primary osteoarthritis of right knee 02/24/2022   Lumbar spondylolysis 04/12/2020   History of CVA (cerebrovascular accident)  03/26/2020   Low back pain radiating to right lower extremity 03/21/2020   B12 deficiency 03/06/2020   Positive anti-CCP test 12/21/2019   Arthralgia 12/13/2019   Dermatitis 12/13/2019   Rheumatoid factor positive 12/13/2019   Essential hypertension 12/11/2018   Palpitations 12/11/2018   Acquired hypothyroidism 11/10/2018   Arthritis of knee 09/17/2016   Anxiety 11/22/2014   Asthma without status asthmaticus 11/22/2014   Benign neoplasm of colon, unspecified 11/22/2014   Environmental allergies 11/22/2014   Hypertriglyceridemia 11/22/2014   Hypokalemia 11/22/2014   Personal history of disease of skin and subcutaneous tissue 11/22/2014   REFERRING DIAG: CVA   THERAPY DIAG:  Muscle weakness (generalized)   Other lack of coordination   Rationale for Evaluation and Treatment Rehabilitation   SUBJECTIVE:    Pt. Reports  that she was in bed fatigued, and slept a lot over the last 6 days. Pt. Reports waking up today feeling much better.   SUBJECTIVE STATEMENT:   Pt accompanied by: significant other   PERTINENT HISTORY: Patient is an 82 year-old female who was admitted to [facility] on 11/23/2022 with a cerebellar CVA. 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 Pt. Was admitted to Inpatient Rehab from 12/21-1/02/2023. 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  Patient underwent inpatient rehabilitation for 2 weeks.  Patient was assessed and was scheduled for a knee replacement on 08/29/2022 however had to cancel it due to having had a CVA.  Patient had a recent fall 2 days after discharging from inpatient rehab. Past Medical History includes: Knee replacement, essential HTN, hypokalemia, leukocytosis, seizures, positive anti-- CCP test, anxiety disorder, mini strokes.  Patient had shingles with left eye nerve pain s/p 1 year ago.    PRECAUTIONS: Fall   WEIGHT BEARING RESTRICTIONS No   PAIN:  Are you  having pain?  7/10 LUE Intermittent initially. Improved with ROM.   FALLS: Has patient fallen in last 6 months? Yes. Number of falls 1   LIVING ENVIRONMENT: Lives with: Lives with Spouse Lives in: House/apartment Stairs: 2 storey home, resides on the first floor.  External: 2 stairs front no rails, and 6 in back with rails Has following equipment at home: Single point cane, Walker - 2 wheeled, Environmental consultant - 4 wheeled, Shower bench, and bed side commode   PLOF: Independent   PATIENT GOALS  To Regain the use of her left arm   OBJECTIVE:    HAND DOMINANCE: Right   ADLs: Overall ADLs: Husband assists pt. as needed Transfers/ambulation related to ADLs:Pt. Uses a 3 wheeled walker with Husband assist. Eating: Pt. Is independent with the right hand. Pt. has difficulty cutting food. Grooming: Pt. is  using her right hand, however has difficulty sustaining her LUE in elevation to assist with haircare. UB Dressing: Pt. Is independent donning a pullover shirt, and button down shirt. Has difficulty with buttoning, LB Dressing:  Independent donning pants, and socks. Difficulty tying shoes. Toileting: Independent Bathing: Pt. Is able to engage her right hand. Tub Shower transfers: Supervision Equipment: See above for equipment     IADLs: Shopping:  Has not had the opportunity for grocery shopping yet Light housekeeping: Husband is assisting with light house keeping Meal Prep:  Dependent Community mobility: Relies of family/friends Medication management: Husband assisting with weekly pillbox set-up, and administering medication. Financial management: TBD Handwriting: 75% legible   MOBILITY STATUS: Hx of falls   POSTURE COMMENTS:  No Significant postural limitations Sitting balance: supported sitting balance WFL   ACTIVITY TOLERANCE: Activity tolerance:  Fatigues in greater than 30 min.    FUNCTIONAL OUTCOME MEASURES: FOTO: 57   UPPER EXTREMITY ROM      Active ROM Right Eval: WFL  Left eval Left  01/22/23 Left  03/03/23 Left  04/14/23  Shoulder flexion   132 100 108 108  Shoulder abduction   80 85 85 85  Shoulder adduction         Shoulder extension         Shoulder internal rotation         Shoulder external rotation         Elbow flexion   140 140 WFL WFL  Elbow extension   WNL WNL WFL WFL  Wrist flexion   65 68    Wrist extension   -10 20 24  32  Wrist ulnar deviation     12 10 14   Wrist radial deviation     8 14 14   Wrist pronation         Wrist supination         (Blank rows = not tested)   Left digit flexion to [facility]: 2nd: 0cm, 3rd: 0cm, 4th: 0cm, 5th: 0cm   Limited Left full 2nd digit extension     UPPER EXTREMITY MMT:      MMT Right Eval: 4+/5 overall Left Eval Left 01/22/23 Left 03/03/23 Left  04/14/2023  Shoulder flexion   3/5 3-/5 3-/5 3-/5  Shoulder abduction   3-/5 3-/5 3-/5 3-/5  Shoulder adduction         Shoulder extension         Shoulder internal rotation         Shoulder external rotation         Middle trapezius         Lower trapezius         Elbow flexion   3+/5 4/5 N/A 4/5  Elbow extension   3+/5 4/4 N/A 4/5  Wrist flexion         Wrist extension   2-/5 3-/5 3-/5 3/5  Wrist ulnar deviation         Wrist radial deviation         Wrist pronation         Wrist supination         (Blank rows = not tested)   HAND FUNCTION: Grip strength: Right: 26#, Left: 10# Pinch strength: Right 8#, Left: 3#, 3 Pt. Pinch strength: Right: 9#, L: 2#  01/22/2023 Grip strength: Right: 26#, Left: 12# Pinch strength:  Pinch meter used at the initial eval has been sent out for recalibration   03/03/2023 Grip strength: Right: 26#, Left: 13#  Pinch strength:  Pinch meter used at the initial eval has been sent out for recalibration  04/14/2023: Grip strength: Right: 26#, Left: 13# Pinch strength:   Right 8#, Left: 4#, 3 Pt. Pinch strength: Right: 9#, L: 3#     COORDINATION: Right: 22 sec., Left: <5 min. To place 7 pegs with increased  compensation proximally in the trunk, and through reflexive associated reactions.  01/22/23 Right: 22 sec., Left: 3 min. & 4 sec.  03/03/23 Right: 22 sec., Left: 1 min. & 39 sec.  04/14/23   TBD      SENSATION: Light touch: WFL, proprioceptive awareness: Intact   EDEMA: N/A   MUSCLE TONE: LUE: Hypotonic   COGNITION: Overall cognitive status: WFL for tasks assessed. Pt. Is impulsive at times.   VISION: Subjective report: Pt. report having shingles affecting left eye  s/p 1 year. Has nerve pain Baseline vision: Wears glasses for reading only Visual history:  updated see clinical impression   VISION ASSESSMENT:    WFL for tasks performed   PERCEPTION: Intact   PRAXIS: Impaired: Motor planning   OBSERVATIONS:  Pt. more alert, and engaging since prior to the most recent hospitalization.     TODAY'S TREATMENT:    Therapeutic Ex:   Pt. tolerated AROM/AAROM for the LUE. Pt. worked on [employer], and reciprocal motion using the UBE while seated for 10 min. with no resistance.   Manual therapy:  Pt. tolerated soft tissue massage to the left  scapular musculature, and upper arm. Manual therapy was performed independent of, and in preparation for there. Ex.  Neuromuscular re-education:   Pt. Worked on left hand [facility] skills grasping 1/2" ircular tipped pegs, and actively extending digits to release them into a container placed at multiple heights. Emphasis was placed on left 2nd digit extension.        PATIENT EDUCATION: Education details: left hand digit extension Person educated: Patient and Spouse Education method: Medical illustrator Education comprehension: verbalized understanding, returned demonstration, and needs further education     HOME EXERCISE PROGRAM:    Reviewed activities at home to promote isolated 2nd digit extension.    GOALS: Goals reviewed with patient? Yes   SHORT TERM GOALS: Target date: 01/27/2023       1. Patient will be  independent with home exercise program for the left upper extremity Baseline: 04/09/2023: Pt. Continues to consistently attempt to engage her hand at hand 02/01/2023: Pt. Consistently attempts to perform HEPs independently. No current home exercise program Goal status: Ongoing     LONG TERM GOALS: Target date: 05/26/2023     Patient will improve left shoulder strength by 2 mm grades to be able to sustain UE's in elevation long enough to wash her hair.  Baseline: 04/09/2023: Pt. is limited with sustaining LUE elevation long enough to perform hair care thoroughly. 03/03/2023: Left shoulder flexion: 3-/5, abduction: 3-/5 01/22/2023: Left shoulder flexion: 3-/5, abduction: 3-/5 Eval: Left shoulder flexion: 3/5, abduction: 3-/5 Goal status: Ongoing   2.  Patient will improve left shoulder active abduction to be able to comb her hair Baseline:  04/09/2023: Pt. Pt. Presents with difficulty abducting her left shoulder enough to thoroughly complete haircare. 03/03/2023: Shoulder abduction: 85 01/22/2023: Shoulder abduction: 85 Eval: Left shoulder abduction is 80(108) Goal status: Ongoing   3.  Patient will independently button her shirt with modified independence. Baseline: 04/09/2023: Pt. Continues to progress towards buttoning. 03/03/2023: Pt. Continues to have difficulty with buttoning. 01/22/2023: Pt. continues to have difficulty. Eval: Patient  has difficulty.  Goal status: Ongoing   4.  Patient well improve left grip strength in preparation for securely holding flowers. Baseline:04/09/2023: Pt. Is able to hold, and hike pants with the left hand, continues to have difficulty with securely holding flowers.   03/03/2023: left grip strength: 13# 01/22/2023: Left: 12# Eval: Pt. Is unable to securely hold flowers. Goal status: Ongoing   5.  Pt. will independently recall adaptive  strategies for performing ADL tasks including: flossing teeth, donning bra, applying makeup. Baseline: 04/09/2023: Continue3/26/2024:  Continue 01/22/2023: Pt. continues to benefit from education about adaptive strategies during ADLs, and IADLs. Eval: Pt. to be provided with adaptive strategies. Goal status: Ongoing   6.  Pt. will improve Foto score by 2 points to reflect patient perceived performance improvement assessment specific ADLs  and IADLs Baseline: 2/13/086:  TBD 03/03/23: FOTO 62 Eval: 57 Goal status: Ongoing  7.  Pt. will improve left hand coordination skills in order to be able to handle, and sort utensils in a drawer.     Baseline:  04/14/2023: 56 04/09/2023: TBD 03/03/23: 1 min. & 39 01/22/2022: Left: 3 min. & 4 sec. Eval: Pt. has difficulty sorting, and placing utensils with the left hand. Left FMC : >5 min. For 7 pegs on the 9 hole peg test.    Goal Status:  Ongoing  8. Pt. will improve active left 2nd digit extension to be able able to isolate her 2nd digit in preparation for pressing/pushing buttons on appliances, phones, or remotes. Baseline: 04/09/2023: Pt. Is progressing with isolating left 2nd digit extension, however continues to present with limited increased flexor tone. 03/03/23: Pt. Continues to work on improving consistency with 2nd digit extension to press the remote. 01/22/2023: is able to perform full digit extension, although 2nd digit is slow to extend s 2/2 flexor tone. Pt.  Eval: Pt. is able to is unable to actively perform full digit extension Goal status:  Ongoing   ASSESSMENT:   CLINICAL IMPRESSION:   Pt.  reports feeling much better today, after sleeping a lot, and spending increased time in bed over the past 6 days. Pt. Tolerated left UE exercises, and FMC tasks well. Pt. Was able to perform the tasks while being further challenged with dual tasking carrying on a conversation. Pt. requires cues, and assist for 2nd digit extension.  Pt. Continues to work on improving isolated 2nd digit extension in preparation for improving prehension patterns. Pt continues to work on improving L shoulder, elbow,  and wrist strength, improving motor control and coordination in order to prepare the left upper extremity and hand for functional reaching and engaging the upper left extremity when performing hair care, buttoning, holding flowers, cutting food, and manipulating utensils to set the table as well as adaptive strategies during daily IADL care.   PERFORMANCE DEFICITS in functional skills including ADLs, IADLs, coordination, proprioception, ROM, strength, FMC, and GMC, cognitive skills including memory, and psychosocial skills including coping strategies, environmental adaptation, interpersonal interactions, and routines and behaviors.    IMPAIRMENTS are limiting patient from ADLs, IADLs, education, leisure, and social participation.    COMORBIDITIES may have co-morbidities  that affects occupational performance. Patient will benefit from skilled OT to address above impairments and improve overall function.   MODIFICATION OR ASSISTANCE TO COMPLETE EVALUATION: Min-Moderate modification of tasks or assist with assess necessary to complete an evaluation.   OT OCCUPATIONAL PROFILE AND HISTORY: Detailed assessment: Review of records and additional review of physical, cognitive, psychosocial history related to current functional  performance.   CLINICAL DECISION MAKING: Moderate - several treatment options, min-mod task modification necessary   REHAB POTENTIAL: Good   EVALUATION COMPLEXITY: Moderate      PLAN: OT FREQUENCY: 2x/week   OT DURATION: 12 weeks   PLANNED INTERVENTIONS: self care/ADL training, therapeutic exercise, therapeutic activity, neuromuscular re-education, manual therapy, passive range of motion, functional mobility training, electrical stimulation, and paraffin   RECOMMENDED OTHER SERVICES: PT   CONSULTED AND AGREED WITH PLAN OF CARE: Patient and family member/caregiver   PLAN FOR NEXT SESSION: Initiate OT treatment  Olegario Messier, MS, OTR/L   04/23/2023

## 2023-04-27 NOTE — Therapy (Signed)
OUTPATIENT PHYSICAL THERAPY NEURO TREATMENT   Patient Name: [name] MRN: [id number] DOB:1941/12/01, 82 y.o., female Today's Date: 04/28/2023   PCP: Aram Beecham, D MD REFERRING PROVIDER: Jacquelynn Cree PA   END OF SESSION:  PT End of Session - 04/28/23 1610     Visit Number 3    Number of Visits 24    Date for PT Re-Evaluation 07/07/23    Authorization Type 3/10 eval 04/14/23    PT Start Time 1646    PT Stop Time 1729    PT Time Calculation (min) 43 min    Equipment Utilized During Treatment Gait belt    Activity Tolerance Patient limited by pain    Behavior During Therapy WFL for tasks assessed/performed               Past Medical History:  Diagnosis Date   Anemia    Arthritis    Asthma    uses inhaler just prior to surgery to avoid attack   Back pain    from previous injury   Complication of anesthesia    has woken  up during 2 different surgery   Depression    no Castillo issue/treatment; situation   Gallstones    GERD (gastroesophageal reflux disease)    Hiatal hernia    patient does NOT have nerve/muscle disease   History of kidney stones    HLD (hyperlipidemia)    HTN (hypertension)    Hypothyroidism    Kidney stones    Knee pain    Nausea and vomiting 10/15/2022   Non-diabetic pancreatic hormone dysfunction years   pt. 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   Pancreatitis    Pneumonia    Seizures (HCC)    caused by dye injected during a procedure   Shortness of breath    with exertion   Sinus problem    frequent infections/congestion   Stroke (HCC) 2021   reports having CVA in 2021 and having mini strokes before that   Thyroid disease    Past Surgical History:  Procedure Laterality Date   ABDOMINAL HYSTERECTOMY     APPENDECTOMY     CARPAL TUNNEL RELEASE  10+ years ago   bilateral   EYE SURGERY  3 yrs ago   bilateral cataracts   FOOT OSTEOTOMY  6 weeks ago   Left foot: great, 2nd & 3rd   FOOT OSTEOTOMY  5 years ago   Right  great toe   HAND SURGERY Bilateral 2011-most recent   multiple hand surgeries, 2 on left, 3 on right   KNEE ARTHROPLASTY Right 04/28/2022   Procedure: COMPUTER ASSISTED TOTAL KNEE ARTHROPLASTY;  Surgeon: Donato Heinz, MD;  Location: [facility];  Service: Orthopedics;  Laterality: Right;   LOOP RECORDER INSERTION N/A 05/16/2020   Procedure: LOOP RECORDER INSERTION;  Surgeon: Marcina Millard, MD;  Location: [facility];  Service: Cardiovascular;  Laterality: N/A;   NASAL SINUS SURGERY  most recent 7-8 yrs ago   7 sinus surgeries    TRIGGER FINGER RELEASE  11/19/2011   Procedure: RELEASE TRIGGER FINGER/A-1 PULLEY;  Surgeon: Nicki Reaper, MD;  Location: [facility];  Service: Orthopedics;  Laterality: Right;  release a-1 pulley right index finger and cyst removal   WRIST GANGLION EXCISION  1980's   right   Patient Active Problem List   Diagnosis Date Noted   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 11/27/2022   Hypomagnesemia 11/27/2022   Occlusion of right vertebral artery  11/23/2022   HLD (hyperlipidemia) 11/23/2022   Asthma 11/23/2022   Depression with anxiety 11/23/2022   Chronic diastolic CHF (congestive heart failure) (HCC) 11/23/2022   Normocytic anemia 11/23/2022   Aspiration pneumonia (HCC) 11/23/2022   AKI (acute kidney injury) (HCC) 11/23/2022   Abdominal pain 11/23/2022   Mesenteric mass 11/23/2022   Coffee ground emesis 11/23/2022   Nausea and vomiting 10/15/2022   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   Fatigue 09/17/2022   Left hemiparesis (HCC) 09/17/2022   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HCC) 08/22/2022   GERD (gastroesophageal reflux disease) 08/21/2022   Agitation 08/20/2022   Acute left-sided weakness 08/20/2022   Expressive aphasia    Stroke (HCC) 08/19/2022   Leukocytosis 08/19/2022   History of urticaria 04/28/2022   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   Primary osteoarthritis of left knee 02/24/2022   Primary  osteoarthritis of right knee 02/24/2022   Lumbar spondylolysis 04/12/2020   History of CVA (cerebrovascular accident) 03/26/2020   Low back pain radiating to right lower extremity 03/21/2020   B12 deficiency 03/06/2020   Positive anti-CCP test 12/21/2019   Arthralgia 12/13/2019   Dermatitis 12/13/2019   Rheumatoid factor positive 12/13/2019   Essential hypertension 12/11/2018   Palpitations 12/11/2018   Acquired hypothyroidism 11/10/2018   Arthritis of knee 09/17/2016   Anxiety 11/22/2014   Asthma without status asthmaticus 11/22/2014   Benign neoplasm of colon, unspecified 11/22/2014   Environmental allergies 11/22/2014   Hypertriglyceridemia 11/22/2014   Hypokalemia 11/22/2014   Personal history of disease of skin and subcutaneous tissue 11/22/2014    ONSET DATE: 11/23/22  REFERRING DIAG: TIA  THERAPY DIAG:  Muscle weakness (generalized)  Difficulty in walking, not elsewhere classified  Unsteadiness on feet  Other lack of coordination  Rationale for Evaluation and Treatment: Rehabilitation  SUBJECTIVE:                                                                                                                                                                                             SUBJECTIVE STATEMENT: Patient presents with her brace on her L knee, reports no soreness or pain after last session.  Pt accompanied by: significant other  PERTINENT HISTORY: Patient is an 82 year-old female who was admitted to [facility] on 11/23/2022 with a cerebellar CVA. 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 Pt. Was admitted to Inpatient Rehab from 12/21-1/02/2023. 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  Patient underwent inpatient rehabilitation for 2 weeks.  Patient was assessed and was scheduled for a knee replacement on 08/29/2022 however had to cancel it due to having had a  CVA.   PMH  includes: anemia, arthritis, back pain, depression, hernia, GERD, HLD, HTN, hypothyroidism, knee pain, pancreatic hormone dysfunction, pneumonia, seizures, stroke, thyroid disease.  Patient has a donjoy brace for L knee. Walking with hand hold help with husband.   PAIN:  Are you having pain? Yes: NPRS scale: 9-10/10 Pain location: L knee Pain description: aching, gives out Aggravating factors: climbing into pain, moving it, walking around house Relieving factors: rest   PRECAUTIONS: Fall  WEIGHT BEARING RESTRICTIONS: No  FALLS: Has patient fallen in last 6 months? Yes. Number of falls 4 falls   LIVING ENVIRONMENT: Lives with: lives with their family and lives with their spouse Lives in: House/apartment Stairs: Yes: Internal: flight steps;   and External: 2 steps; on right going up and on left going up Has following equipment at home: Single point cane, Walker - 2 wheeled, Shower bench, and bed side commode  PLOF: Independent  PATIENT GOALS: to have less pain and move better  OBJECTIVE:   DIAGNOSTIC FINDINGS:  Disc levels:   C2-3: No significant stenosis.   C3-4: A rightward disc osteophyte complex effaces the ventral CSF. Severe left and moderate right foraminal stenosis is present.   C4-5: A broad-based disc osteophyte complex effaces the ventral CSF. The canal is narrowed 7 mm. Severe left and moderate right foraminal stenosis is present.   C5-6: Chronic loss of disc height is present. Broad-based disc osteophyte complex is asymmetric to the right. Partial effacement of the ventral CSF is present. Moderate foraminal stenosis is worse right than left.   C6-7: A broad-based disc osteophyte complex is present. This partially effaces the ventral CSF. Moderate foraminal narrowing is worse right than left.   C7-T1: Uncovertebral spurring is present bilaterally. Mild bilateral foraminal stenosis is present.   IMPRESSION: 1. Multilevel spondylosis of the cervical spine  as described. 2. 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 3. 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 4. No other significant cord signal abnormality to suggest ischemic changes to the cord. 5. Severe left and moderate right foraminal stenosis at C3-4 and C4-5. 6. Moderate foraminal stenosis bilaterally at C5-6 and C6-7 is worse on the right. 7. Mild bilateral foraminal narrowing at C7-T1. 8. Prominent soft tissue pannus at C1-2 effaces the ventral CSF. 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  IMPRESSION: 1. 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 2. 9 mm right occipital pole infarct. 3. Punctate white matter infarct in the left occipital lobe. 4. Additional punctate cortical infarct in the medial right occipital lobe more superiorly. 5. Expected evolution of previous right posterior frontal lobe white matter infarct. 6. Stable atrophy and white matter disease likely reflects the sequela of chronic microvascular ischemia.  COGNITION: Overall cognitive status:  two strokes   SENSATION: WFL  COORDINATION: Heel slide test limited by LLE pain  POSTURE: rounded shoulders, forward head, and flexed trunk   LOWER EXTREMITY ROM:     Bowing of L knee  LOWER EXTREMITY MMT:    MMT Right Eval Left Eval  Hip flexion 3 3*  Hip extension    Hip abduction 3 3*  Hip adduction 3 *  Hip internal rotation    Hip external rotation    Knee flexion 3 3*  Knee extension 3 3*  Ankle dorsiflexion 4 4  Ankle plantarflexion 4 4  (Blank rows = not tested) *  pain  BED MOBILITY:  Per patient she has difficulty, occasionally needs help  TRANSFERS: Assistive device utilized: None  Sit to stand: CGA and Min A Stand to sit: Min A Chair to chair: CGA    GAIT: Gait pattern: step to pattern, decreased  stance time- Left, genu recurvatum- Left, and shuffling Distance walked: 10 ft Assistive device utilized: None Level of assistance: CGA Comments: Patient is very painful with ambulation, unable to ambulate longer duration   FUNCTIONAL TESTS:  5 times sit to stand: unable to tolerate 10 meter walk test: unable to tolerate  PATIENT SURVEYS:  FOTO 47  TODAY'S TREATMENT:                                                                                                                              DATE: 04/28/23   TherEx:  Supine: SAQ 10x ; 2 sets; 2 lb ankle weights  Bridges 10x; x 2 sets  Abduction GTB 10x; x 2 sets  Adduction ball squeeze 10x x 2 sets  Posterior pelvic tilt 10x; 2 sets  Seated: LAQ 10x; x 2 sets Heel toe raise 15x x2 sets   Standing: 3x30 second holds holding onto PT   PATIENT EDUCATION: Education details: goals, POC Person educated: Patient and Spouse Education method: Explanation, Demonstration, Tactile cues, and Verbal cues Education comprehension: verbalized understanding, returned demonstration, verbal cues required, and tactile cues required  HOME EXERCISE PROGRAM: Access Code: [email or web address] URL: [email or web address] Date: 04/14/2023 Prepared by: Precious Bard  Exercises - Seated Single Leg Hip Abduction  - 1 x daily - 7 x weekly - 2 sets - 10 reps - 5 hold - Seated Heel Slide  - 1 x daily - 7 x weekly - 2 sets - 10 reps - 5 hold - Seated Heel Toe Raises  - 1 x daily - 7 x weekly - 2 sets - 10 reps - 5 hold  GOALS: Goals reviewed with patient? Yes  SHORT TERM GOALS: Target date: 05/12/2023    Patient will be independent in home exercise program to improve strength/mobility for better functional independence with ADLs. Baseline: 5/7:  Goal status: INITIAL    LONG TERM GOALS: Target date: 07/07/2023    Patient will increase FOTO score to equal to or greater than   57%  to demonstrate statistically significant improvement in  mobility and quality of life.  Baseline:  47% Goal status: INITIAL  2.  Patient (> 79 years old) will complete five times sit to stand test in < 15 seconds indicating an increased LE strength and improved balance. Baseline: 5/7: unable to tolerate test Goal status: INITIAL  3.   Patient will deny any falls over past 4 weeks to demonstrate improved safety awareness at home and work.  Baseline: 5/7: multiple falls Goal status: INITIAL  4.  Patient will increase BLE gross strength to 4+/5 as to improve functional strength for independent gait, increased standing tolerance and increased ADL ability Baseline:  5/7: see above  Goal status: INITIAL  5.  Patient will be able to perform household work/ chores without increase in symptoms. Baseline: 5/7: unable to perform  Goal status: INITIAL    ASSESSMENT:  CLINICAL IMPRESSION:Patient tolerates use of weights this session. Utilization of green band tolerated well and ankle weights. Progression to bridges performed.   She is able to tolerate short duration standing this session with support and in front of stable surface due to buckling of limbs. Patient is highly motivated and eager to progress her mobility. Patient will benefit from skilled physical therapy to reduce pain, improve mobility, improve strength and improve quality of life.    OBJECTIVE IMPAIRMENTS: Abnormal gait, cardiopulmonary status limiting activity, decreased activity tolerance, decreased balance, decreased cognition, decreased endurance, decreased knowledge of use of DME, decreased mobility, difficulty walking, decreased ROM, decreased strength, decreased safety awareness, hypomobility, increased fascial restrictions, impaired perceived functional ability, impaired flexibility, impaired UE functional use, improper body mechanics, postural dysfunction, and pain.   ACTIVITY LIMITATIONS: carrying, lifting, bending, sitting, standing, squatting, stairs, transfers, bed mobility,  bathing, toileting, dressing, reach over head, hygiene/grooming, locomotion level, and caring for others  PARTICIPATION LIMITATIONS: meal prep, cleaning, laundry, medication management, personal finances, interpersonal relationship, driving, shopping, community activity, yard work, school, and church  PERSONAL FACTORS: Age, Behavior pattern, Education, Fitness, Past/Castillo experiences, Time since onset of injury/illness/exacerbation, and 3+ comorbidities: anemia, arthritis, back pain, depression, hernia, GERD, HLD, HTN, hypothyroidism, knee pain, pancreatic hormone dysfunction, pneumonia, seizures, stroke, thyroid disease  are also affecting patient's functional outcome.   REHAB POTENTIAL: Fair    CLINICAL DECISION MAKING: Evolving/moderate complexity  EVALUATION COMPLEXITY: Moderate  PLAN:  PT FREQUENCY: 1-2x/week  PT DURATION: 12 weeks  PLANNED INTERVENTIONS: Therapeutic exercises, Therapeutic activity, Neuromuscular re-education, Balance training, Gait training, Patient/Family education, Self Care, Joint mobilization, Joint manipulation, Stair training, Vestibular training, Canalith repositioning, Visual/preceptual remediation/compensation, Orthotic/Fit training, DME instructions, Electrical stimulation, Wheelchair mobility training, Spinal mobilization, Cryotherapy, Moist heat, Compression bandaging, scar mobilization, Splintting, Taping, Traction, Ultrasound, Manual therapy, and Re-evaluation  PLAN FOR NEXT SESSION: seated and supine strengthening of LLE    Precious Bard, PT 04/28/2023, 5:31 PM

## 2023-04-28 ENCOUNTER — Ambulatory Visit: Payer: Medicare HMO

## 2023-04-28 ENCOUNTER — Ambulatory Visit: Payer: Medicare HMO | Admitting: Occupational Therapy

## 2023-04-28 DIAGNOSIS — R278 Other lack of coordination: Secondary | ICD-10-CM | POA: Diagnosis not present

## 2023-04-28 DIAGNOSIS — R262 Difficulty in walking, not elsewhere classified: Secondary | ICD-10-CM

## 2023-04-28 DIAGNOSIS — R2681 Unsteadiness on feet: Secondary | ICD-10-CM

## 2023-04-28 DIAGNOSIS — M6281 Muscle weakness (generalized): Secondary | ICD-10-CM | POA: Diagnosis not present

## 2023-04-28 NOTE — Therapy (Signed)
Occupational Therapy Treatment Note   Patient Name: [name] MRN: [id number] DOB:17-Nov-1941, 82 y.o., female Today's Date: 04/28/2023  PCP: Dr. Judithann Sheen REFERRING PROVIDER:  Dr. Judithann Sheen  END OF SESSION:  OT End of Session - 04/28/23 1731     Visit Number 33    Number of Visits 48    Date for OT Re-Evaluation 05/26/23    OT Start Time 1600    OT Stop Time 1645    OT Time Calculation (min) 45 min    Activity Tolerance Patient tolerated treatment well    Behavior During Therapy WFL for tasks assessed/performed             Past Medical History:  Diagnosis Date   Anemia    Arthritis    Asthma    uses inhaler just prior to surgery to avoid attack   Back pain    from previous injury   Complication of anesthesia    has woken  up during 2 different surgery   Depression    no current issue/treatment; situation   Gallstones    GERD (gastroesophageal reflux disease)    Hiatal hernia    patient does NOT have nerve/muscle disease   History of kidney stones    HLD (hyperlipidemia)    HTN (hypertension)    Hypothyroidism    Kidney stones    Knee pain    Nausea and vomiting 10/15/2022   Non-diabetic pancreatic hormone dysfunction years   pt. 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   Pancreatitis    Pneumonia    Seizures (HCC)    caused by dye injected during a procedure   Shortness of breath    with exertion   Sinus problem    frequent infections/congestion   Stroke (HCC) 2021   reports having CVA in 2021 and having mini strokes before that   Thyroid disease    Past Surgical History:  Procedure Laterality Date   ABDOMINAL HYSTERECTOMY     APPENDECTOMY     CARPAL TUNNEL RELEASE  10+ years ago   bilateral   EYE SURGERY  3 yrs ago   bilateral cataracts   FOOT OSTEOTOMY  6 weeks ago   Left foot: great, 2nd & 3rd   FOOT OSTEOTOMY  5 years ago   Right great toe   HAND SURGERY Bilateral 2011-most recent   multiple hand surgeries, 2 on left, 3 on right   KNEE  ARTHROPLASTY Right 04/28/2022   Procedure: COMPUTER ASSISTED TOTAL KNEE ARTHROPLASTY;  Surgeon: Donato Heinz, MD;  Location: [facility];  Service: Orthopedics;  Laterality: Right;   LOOP RECORDER INSERTION N/A 05/16/2020   Procedure: LOOP RECORDER INSERTION;  Surgeon: Marcina Millard, MD;  Location: [facility];  Service: Cardiovascular;  Laterality: N/A;   NASAL SINUS SURGERY  most recent 7-8 yrs ago   7 sinus surgeries    TRIGGER FINGER RELEASE  11/19/2011   Procedure: RELEASE TRIGGER FINGER/A-1 PULLEY;  Surgeon: Nicki Reaper, MD;  Location: [facility];  Service: Orthopedics;  Laterality: Right;  release a-1 pulley right index finger and cyst removal   WRIST GANGLION EXCISION  1980's   right   Patient Active Problem List   Diagnosis Date Noted   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 11/27/2022   Hypomagnesemia 11/27/2022   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   HLD (hyperlipidemia) 11/23/2022   Asthma 11/23/2022   Depression with anxiety 11/23/2022   Chronic diastolic CHF (congestive heart failure) (HCC)  11/23/2022   Normocytic anemia 11/23/2022   Aspiration pneumonia (HCC) 11/23/2022   AKI (acute kidney injury) (HCC) 11/23/2022   Abdominal pain 11/23/2022   Mesenteric mass 11/23/2022   Coffee ground emesis 11/23/2022   Nausea and vomiting 10/15/2022   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   Fatigue 09/17/2022   Left hemiparesis (HCC) 09/17/2022   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HCC) 08/22/2022   GERD (gastroesophageal reflux disease) 08/21/2022   Agitation 08/20/2022   Acute left-sided weakness 08/20/2022   Expressive aphasia    Stroke (HCC) 08/19/2022   Leukocytosis 08/19/2022   History of urticaria 04/28/2022   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   Primary osteoarthritis of left knee 02/24/2022   Primary osteoarthritis of right knee 02/24/2022   Lumbar spondylolysis 04/12/2020   History of CVA (cerebrovascular accident)  03/26/2020   Low back pain radiating to right lower extremity 03/21/2020   B12 deficiency 03/06/2020   Positive anti-CCP test 12/21/2019   Arthralgia 12/13/2019   Dermatitis 12/13/2019   Rheumatoid factor positive 12/13/2019   Essential hypertension 12/11/2018   Palpitations 12/11/2018   Acquired hypothyroidism 11/10/2018   Arthritis of knee 09/17/2016   Anxiety 11/22/2014   Asthma without status asthmaticus 11/22/2014   Benign neoplasm of colon, unspecified 11/22/2014   Environmental allergies 11/22/2014   Hypertriglyceridemia 11/22/2014   Hypokalemia 11/22/2014   Personal history of disease of skin and subcutaneous tissue 11/22/2014   REFERRING DIAG: CVA   THERAPY DIAG:  Muscle weakness (generalized)   Other lack of coordination   Rationale for Evaluation and Treatment Rehabilitation   SUBJECTIVE:    Pt. Reports  that she had an upset stomach today.   SUBJECTIVE STATEMENT:   Pt accompanied by: significant other   PERTINENT HISTORY: Patient is an 82 year-old female who was admitted to [facility] on 11/23/2022 with a cerebellar CVA. 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 Pt. Was admitted to Inpatient Rehab from 12/21-1/02/2023. 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  Patient underwent inpatient rehabilitation for 2 weeks.  Patient was assessed and was scheduled for a knee replacement on 08/29/2022 however had to cancel it due to having had a CVA.  Patient had a recent fall 2 days after discharging from inpatient rehab. Past Medical History includes: Knee replacement, essential HTN, hypokalemia, leukocytosis, seizures, positive anti-- CCP test, anxiety disorder, mini strokes.  Patient had shingles with left eye nerve pain s/p 1 year ago.    PRECAUTIONS: Fall   WEIGHT BEARING RESTRICTIONS No   PAIN:  Are you having pain?  7/10 LUE Intermittent initially. Improved with ROM.   FALLS: Has  patient fallen in last 6 months? Yes. Number of falls 1   LIVING ENVIRONMENT: Lives with: Lives with Spouse Lives in: House/apartment Stairs: 2 storey home, resides on the first floor.  External: 2 stairs front no rails, and 6 in back with rails Has following equipment at home: Single point cane, Walker - 2 wheeled, Environmental consultant - 4 wheeled, Shower bench, and bed side commode   PLOF: Independent   PATIENT GOALS  To Regain the use of her left arm   OBJECTIVE:    HAND DOMINANCE: Right   ADLs: Overall ADLs: Husband assists pt. as needed Transfers/ambulation related to ADLs:Pt. Uses a 3 wheeled walker with Husband assist. Eating: Pt. Is independent with the right hand. Pt. has difficulty cutting food. Grooming: Pt. is using her right hand, however has difficulty sustaining her LUE in elevation to assist with haircare.  UB Dressing: Pt. Is independent donning a pullover shirt, and button down shirt. Has difficulty with buttoning, LB Dressing:  Independent donning pants, and socks. Difficulty tying shoes. Toileting: Independent Bathing: Pt. Is able to engage her right hand. Tub Shower transfers: Supervision Equipment: See above for equipment     IADLs: Shopping:  Has not had the opportunity for grocery shopping yet Light housekeeping: Husband is assisting with light house keeping Meal Prep:  Dependent Community mobility: Relies of family/friends Medication management: Husband assisting with weekly pillbox set-up, and administering medication. Financial management: TBD Handwriting: 75% legible   MOBILITY STATUS: Hx of falls   POSTURE COMMENTS:  No Significant postural limitations Sitting balance: supported sitting balance WFL   ACTIVITY TOLERANCE: Activity tolerance:  Fatigues in greater than 30 min.    FUNCTIONAL OUTCOME MEASURES: FOTO: 57   UPPER EXTREMITY ROM      Active ROM Right Eval: WFL Left eval Left  01/22/23 Left  03/03/23 Left  04/14/23  Shoulder flexion   132 100  108 108  Shoulder abduction   80 85 85 85  Shoulder adduction         Shoulder extension         Shoulder internal rotation         Shoulder external rotation         Elbow flexion   140 140 WFL WFL  Elbow extension   WNL WNL WFL WFL  Wrist flexion   65 68    Wrist extension   -10 20 24  32  Wrist ulnar deviation     12 10 14   Wrist radial deviation     8 14 14   Wrist pronation         Wrist supination         (Blank rows = not tested)   Left digit flexion to [facility]: 2nd: 0cm, 3rd: 0cm, 4th: 0cm, 5th: 0cm   Limited Left full 2nd digit extension     UPPER EXTREMITY MMT:      MMT Right Eval: 4+/5 overall Left Eval Left 01/22/23 Left 03/03/23 Left  04/14/2023  Shoulder flexion   3/5 3-/5 3-/5 3-/5  Shoulder abduction   3-/5 3-/5 3-/5 3-/5  Shoulder adduction         Shoulder extension         Shoulder internal rotation         Shoulder external rotation         Middle trapezius         Lower trapezius         Elbow flexion   3+/5 4/5 N/A 4/5  Elbow extension   3+/5 4/4 N/A 4/5  Wrist flexion         Wrist extension   2-/5 3-/5 3-/5 3/5  Wrist ulnar deviation         Wrist radial deviation         Wrist pronation         Wrist supination         (Blank rows = not tested)   HAND FUNCTION: Grip strength: Right: 26#, Left: 10# Pinch strength: Right 8#, Left: 3#, 3 Pt. Pinch strength: Right: 9#, L: 2#  01/22/2023 Grip strength: Right: 26#, Left: 12# Pinch strength:  Pinch meter used at the initial eval has been sent out for recalibration   03/03/2023 Grip strength: Right: 26#, Left: 13# Pinch strength:  Pinch meter used at the initial eval has been sent out for recalibration  04/14/2023: Grip strength: Right: 26#, Left: 13# Pinch strength:   Right 8#, Left: 4#, 3 Pt. Pinch strength: Right: 9#, L: 3#     COORDINATION: Right: 22 sec., Left: <5 min. To place 7 pegs with increased compensation proximally in the trunk, and through reflexive associated  reactions.  01/22/23 Right: 22 sec., Left: 3 min. & 4 sec.  03/03/23 Right: 22 sec., Left: 1 min. & 39 sec.  04/14/23   TBD      SENSATION: Light touch: WFL, proprioceptive awareness: Intact   EDEMA: N/A   MUSCLE TONE: LUE: Hypotonic   COGNITION: Overall cognitive status: WFL for tasks assessed. Pt. Is impulsive at times.   VISION: Subjective report: Pt. report having shingles affecting left eye  s/p 1 year. Has nerve pain Baseline vision: Wears glasses for reading only Visual history:  updated see clinical impression   VISION ASSESSMENT:    WFL for tasks performed   PERCEPTION: Intact   PRAXIS: Impaired: Motor planning   OBSERVATIONS:  Pt. more alert, and engaging since prior to the most recent hospitalization.     TODAY'S TREATMENT:    Therapeutic Ex:   Pt. tolerated AROM/AAROM for the LUE. Pt. worked on [employer], and reciprocal motion using the UBE while seated for 8 min. with no resistance. Pt. Worked on reps of 2nd digit extension  Neuromuscular re-education:   Pt. worked on left hand [facility] skills grasping small pill sized beads with her 2nd digit, and thumb. Pt. worked on reaching up, to place them into a medication bottle. @nd  digit extension was emphasized when placing them into the container.       PATIENT EDUCATION: Education details: left hand digit extension Person educated: Patient and Spouse Education method: Medical illustrator Education comprehension: verbalized understanding, returned demonstration, and needs further education     HOME EXERCISE PROGRAM:    Reviewed activities at home to promote isolated 2nd digit extension.    GOALS: Goals reviewed with patient? Yes   SHORT TERM GOALS: Target date: 01/27/2023       1. Patient will be independent with home exercise program for the left upper extremity Baseline: 04/09/2023: Pt. Continues to consistently attempt to engage her hand at hand 02/01/2023: Pt. Consistently  attempts to perform HEPs independently. No current home exercise program Goal status: Ongoing     LONG TERM GOALS: Target date: 05/26/2023     Patient will improve left shoulder strength by 2 mm grades to be able to sustain UE's in elevation long enough to wash her hair.  Baseline: 04/09/2023: Pt. is limited with sustaining LUE elevation long enough to perform hair care thoroughly. 03/03/2023: Left shoulder flexion: 3-/5, abduction: 3-/5 01/22/2023: Left shoulder flexion: 3-/5, abduction: 3-/5 Eval: Left shoulder flexion: 3/5, abduction: 3-/5 Goal status: Ongoing   2.  Patient will improve left shoulder active abduction to be able to comb her hair Baseline:  04/09/2023: Pt. Pt. Presents with difficulty abducting her left shoulder enough to thoroughly complete haircare. 03/03/2023: Shoulder abduction: 85 01/22/2023: Shoulder abduction: 85 Eval: Left shoulder abduction is 80(108) Goal status: Ongoing   3.  Patient will independently button her shirt with modified independence. Baseline: 04/09/2023: Pt. Continues to progress towards buttoning. 03/03/2023: Pt. Continues to have difficulty with buttoning. 01/22/2023: Pt. continues to have difficulty. Eval: Patient has difficulty.  Goal status: Ongoing   4.  Patient well improve left grip strength in preparation for securely holding flowers. Baseline:04/09/2023: Pt. Is able to hold, and hike pants with the left  hand, continues to have difficulty with securely holding flowers.   03/03/2023: left grip strength: 13# 01/22/2023: Left: 12# Eval: Pt. Is unable to securely hold flowers. Goal status: Ongoing   5.  Pt. will independently recall adaptive  strategies for performing ADL tasks including: flossing teeth, donning bra, applying makeup. Baseline: 04/09/2023: Continue3/26/2024: Continue 01/22/2023: Pt. continues to benefit from education about adaptive strategies during ADLs, and IADLs. Eval: Pt. to be provided with adaptive strategies. Goal status: Ongoing    6.  Pt. will improve Foto score by 2 points to reflect patient perceived performance improvement assessment specific ADLs  and IADLs Baseline: 1/61/096:  TBD 03/03/23: FOTO 62 Eval: 57 Goal status: Ongoing  7.  Pt. will improve left hand coordination skills in order to be able to handle, and sort utensils in a drawer.     Baseline:  04/14/2023: 56 04/09/2023: TBD 03/03/23: 1 min. & 39 01/22/2022: Left: 3 min. & 4 sec. Eval: Pt. has difficulty sorting, and placing utensils with the left hand. Left FMC : >5 min. For 7 pegs on the 9 hole peg test.    Goal Status:  Ongoing  8. Pt. will improve active left 2nd digit extension to be able able to isolate her 2nd digit in preparation for pressing/pushing buttons on appliances, phones, or remotes. Baseline: 04/09/2023: Pt. Is progressing with isolating left 2nd digit extension, however continues to present with limited increased flexor tone. 03/03/23: Pt. Continues to work on improving consistency with 2nd digit extension to press the remote. 01/22/2023: is able to perform full digit extension, although 2nd digit is slow to extend s 2/2 flexor tone. Pt.  Eval: Pt. is able to is unable to actively perform full digit extension Goal status:  Ongoing   ASSESSMENT:   CLINICAL IMPRESSION:  Pt. Presents with increased tone in the left 2nd digit today, and required intervals of proprioception through a flat hand at the tabletop surface. Pt. requires cues, and assist for 2nd digit extension. Pt. continues to work on improving isolated 2nd digit extension in preparation for improving prehension patterns.  Pt. Required the task at the flat tabletop surface to be modified, and placed on an incline at the table 2/2 Pt. Reports of neck soreness when looking down at tasks at the tabletop surface. Pt continues to work on improving L shoulder, elbow, and wrist strength, improving motor control and coordination in order to prepare the left upper extremity and hand for functional  reaching and engaging the upper left extremity when performing hair care, buttoning, holding flowers, cutting food, and manipulating utensils to set the table as well as adaptive strategies during daily IADL care.   PERFORMANCE DEFICITS in functional skills including ADLs, IADLs, coordination, proprioception, ROM, strength, FMC, and GMC, cognitive skills including memory, and psychosocial skills including coping strategies, environmental adaptation, interpersonal interactions, and routines and behaviors.    IMPAIRMENTS are limiting patient from ADLs, IADLs, education, leisure, and social participation.    COMORBIDITIES may have co-morbidities  that affects occupational performance. Patient will benefit from skilled OT to address above impairments and improve overall function.   MODIFICATION OR ASSISTANCE TO COMPLETE EVALUATION: Min-Moderate modification of tasks or assist with assess necessary to complete an evaluation.   OT OCCUPATIONAL PROFILE AND HISTORY: Detailed assessment: Review of records and additional review of physical, cognitive, psychosocial history related to current functional performance.   CLINICAL DECISION MAKING: Moderate - several treatment options, min-mod task modification necessary   REHAB POTENTIAL: Good   EVALUATION COMPLEXITY: Moderate  PLAN: OT FREQUENCY: 2x/week   OT DURATION: 12 weeks   PLANNED INTERVENTIONS: self care/ADL training, therapeutic exercise, therapeutic activity, neuromuscular re-education, manual therapy, passive range of motion, functional mobility training, electrical stimulation, and paraffin   RECOMMENDED OTHER SERVICES: PT   CONSULTED AND AGREED WITH PLAN OF CARE: Patient and family member/caregiver   PLAN FOR NEXT SESSION: Initiate OT treatment  Olegario Messier, MS, OTR/L   04/28/2023 5:34pm

## 2023-04-30 ENCOUNTER — Ambulatory Visit: Payer: Medicare HMO | Admitting: Occupational Therapy

## 2023-04-30 DIAGNOSIS — R278 Other lack of coordination: Secondary | ICD-10-CM | POA: Diagnosis not present

## 2023-04-30 DIAGNOSIS — R262 Difficulty in walking, not elsewhere classified: Secondary | ICD-10-CM | POA: Diagnosis not present

## 2023-04-30 DIAGNOSIS — M6281 Muscle weakness (generalized): Secondary | ICD-10-CM | POA: Diagnosis not present

## 2023-04-30 DIAGNOSIS — R2681 Unsteadiness on feet: Secondary | ICD-10-CM | POA: Diagnosis not present

## 2023-04-30 NOTE — Therapy (Addendum)
Occupational Therapy Treatment Note   Patient Name: [name] MRN: [id number] DOB:22-Jan-1941, 82 y.o., female Today's Date: 04/30/2023  PCP: Dr. Judithann Sheen REFERRING PROVIDER:  Dr. Judithann Sheen  END OF SESSION:  OT End of Session - 04/30/23 1658     Visit Number 34    Number of Visits 48    Date for OT Re-Evaluation 05/26/23    OT Start Time 1600    OT Stop Time 1645    OT Time Calculation (min) 45 min    Equipment Utilized During Treatment Wheelchair    Activity Tolerance Patient tolerated treatment well    Behavior During Therapy WFL for tasks assessed/performed             Past Medical History:  Diagnosis Date   Anemia    Arthritis    Asthma    uses inhaler just prior to surgery to avoid attack   Back pain    from previous injury   Complication of anesthesia    has woken  up during 2 different surgery   Depression    no current issue/treatment; situation   Gallstones    GERD (gastroesophageal reflux disease)    Hiatal hernia    patient does NOT have nerve/muscle disease   History of kidney stones    HLD (hyperlipidemia)    HTN (hypertension)    Hypothyroidism    Kidney stones    Knee pain    Nausea and vomiting 10/15/2022   Non-diabetic pancreatic hormone dysfunction years   pt. 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   Pancreatitis    Pneumonia    Seizures (HCC)    caused by dye injected during a procedure   Shortness of breath    with exertion   Sinus problem    frequent infections/congestion   Stroke (HCC) 2021   reports having CVA in 2021 and having mini strokes before that   Thyroid disease    Past Surgical History:  Procedure Laterality Date   ABDOMINAL HYSTERECTOMY     APPENDECTOMY     CARPAL TUNNEL RELEASE  10+ years ago   bilateral   EYE SURGERY  3 yrs ago   bilateral cataracts   FOOT OSTEOTOMY  6 weeks ago   Left foot: great, 2nd & 3rd   FOOT OSTEOTOMY  5 years ago   Right great toe   HAND SURGERY Bilateral 2011-most recent    multiple hand surgeries, 2 on left, 3 on right   KNEE ARTHROPLASTY Right 04/28/2022   Procedure: COMPUTER ASSISTED TOTAL KNEE ARTHROPLASTY;  Surgeon: Donato Heinz, MD;  Location: [facility];  Service: Orthopedics;  Laterality: Right;   LOOP RECORDER INSERTION N/A 05/16/2020   Procedure: LOOP RECORDER INSERTION;  Surgeon: Marcina Millard, MD;  Location: [facility];  Service: Cardiovascular;  Laterality: N/A;   NASAL SINUS SURGERY  most recent 7-8 yrs ago   7 sinus surgeries    TRIGGER FINGER RELEASE  11/19/2011   Procedure: RELEASE TRIGGER FINGER/A-1 PULLEY;  Surgeon: Nicki Reaper, MD;  Location: [facility];  Service: Orthopedics;  Laterality: Right;  release a-1 pulley right index finger and cyst removal   WRIST GANGLION EXCISION  1980's   right   Patient Active Problem List   Diagnosis Date Noted   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 11/27/2022   Hypomagnesemia 11/27/2022   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   HLD (hyperlipidemia) 11/23/2022   Asthma 11/23/2022   Depression with anxiety 11/23/2022  Chronic diastolic CHF (congestive heart failure) (HCC) 11/23/2022   Normocytic anemia 11/23/2022   Aspiration pneumonia (HCC) 11/23/2022   AKI (acute kidney injury) (HCC) 11/23/2022   Abdominal pain 11/23/2022   Mesenteric mass 11/23/2022   Coffee ground emesis 11/23/2022   Nausea and vomiting 10/15/2022   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   Fatigue 09/17/2022   Left hemiparesis (HCC) 09/17/2022   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HCC) 08/22/2022   GERD (gastroesophageal reflux disease) 08/21/2022   Agitation 08/20/2022   Acute left-sided weakness 08/20/2022   Expressive aphasia    Stroke (HCC) 08/19/2022   Leukocytosis 08/19/2022   History of urticaria 04/28/2022   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   Primary osteoarthritis of left knee 02/24/2022   Primary osteoarthritis of right knee 02/24/2022   Lumbar spondylolysis  04/12/2020   History of CVA (cerebrovascular accident) 03/26/2020   Low back pain radiating to right lower extremity 03/21/2020   B12 deficiency 03/06/2020   Positive anti-CCP test 12/21/2019   Arthralgia 12/13/2019   Dermatitis 12/13/2019   Rheumatoid factor positive 12/13/2019   Essential hypertension 12/11/2018   Palpitations 12/11/2018   Acquired hypothyroidism 11/10/2018   Arthritis of knee 09/17/2016   Anxiety 11/22/2014   Asthma without status asthmaticus 11/22/2014   Benign neoplasm of colon, unspecified 11/22/2014   Environmental allergies 11/22/2014   Hypertriglyceridemia 11/22/2014   Hypokalemia 11/22/2014   Personal history of disease of skin and subcutaneous tissue 11/22/2014   REFERRING DIAG: CVA   THERAPY DIAG:  Muscle weakness (generalized)   Other lack of coordination   Rationale for Evaluation and Treatment Rehabilitation   SUBJECTIVE:    Pt. Reports that she recently got a kitten yesterday.    SUBJECTIVE STATEMENT:   Pt accompanied by: significant other   PERTINENT HISTORY: Patient is an 82 year-old female who was admitted to [facility] on 11/23/2022 with a cerebellar CVA. 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 Pt. Was admitted to Inpatient Rehab from 12/21-1/02/2023. 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  Patient underwent inpatient rehabilitation for 2 weeks.  Patient was assessed and was scheduled for a knee replacement on 08/29/2022 however had to cancel it due to having had a CVA.  Patient had a recent fall 2 days after discharging from inpatient rehab. Past Medical History includes: Knee replacement, essential HTN, hypokalemia, leukocytosis, seizures, positive anti-- CCP test, anxiety disorder, mini strokes.  Patient had shingles with left eye nerve pain s/p 1 year ago.    PRECAUTIONS: Fall   WEIGHT BEARING RESTRICTIONS No   PAIN:  Are you having pain?  0/10  LUE    FALLS: Has patient fallen in last 6 months? Yes. Number of falls 1   LIVING ENVIRONMENT: Lives with: Lives with Spouse Lives in: House/apartment Stairs: 2 storey home, resides on the first floor.  External: 2 stairs front no rails, and 6 in back with rails Has following equipment at home: Single point cane, Walker - 2 wheeled, Environmental consultant - 4 wheeled, Shower bench, and bed side commode   PLOF: Independent   PATIENT GOALS  To Regain the use of her left arm   OBJECTIVE:    HAND DOMINANCE: Right   ADLs: Overall ADLs: Husband assists pt. as needed Transfers/ambulation related to ADLs:Pt. Uses a 3 wheeled walker with Husband assist. Eating: Pt. Is independent with the right hand. Pt. has difficulty cutting food. Grooming: Pt. is using her right hand, however has difficulty sustaining her LUE in elevation to  assist with haircare. UB Dressing: Pt. Is independent donning a pullover shirt, and button down shirt. Has difficulty with buttoning, LB Dressing:  Independent donning pants, and socks. Difficulty tying shoes. Toileting: Independent Bathing: Pt. Is able to engage her right hand. Tub Shower transfers: Supervision Equipment: See above for equipment     IADLs: Shopping:  Has not had the opportunity for grocery shopping yet Light housekeeping: Husband is assisting with light house keeping Meal Prep:  Dependent Community mobility: Relies of family/friends Medication management: Husband assisting with weekly pillbox set-up, and administering medication. Financial management: TBD Handwriting: 75% legible   MOBILITY STATUS: Hx of falls   POSTURE COMMENTS:  No Significant postural limitations Sitting balance: supported sitting balance WFL   ACTIVITY TOLERANCE: Activity tolerance:  Fatigues in greater than 30 min.    FUNCTIONAL OUTCOME MEASURES: FOTO: 57   UPPER EXTREMITY ROM      Active ROM Right Eval: WFL Left eval Left  01/22/23 Left  03/03/23 Left  04/14/23   Shoulder flexion   132 100 108 108  Shoulder abduction   80 85 85 85  Shoulder adduction         Shoulder extension         Shoulder internal rotation         Shoulder external rotation         Elbow flexion   140 140 WFL WFL  Elbow extension   WNL WNL WFL WFL  Wrist flexion   65 68    Wrist extension   -10 20 24  32  Wrist ulnar deviation     12 10 14   Wrist radial deviation     8 14 14   Wrist pronation         Wrist supination         (Blank rows = not tested)   Left digit flexion to [facility]: 2nd: 0cm, 3rd: 0cm, 4th: 0cm, 5th: 0cm   Limited Left full 2nd digit extension     UPPER EXTREMITY MMT:      MMT Right Eval: 4+/5 overall Left Eval Left 01/22/23 Left 03/03/23 Left  04/14/2023  Shoulder flexion   3/5 3-/5 3-/5 3-/5  Shoulder abduction   3-/5 3-/5 3-/5 3-/5  Shoulder adduction         Shoulder extension         Shoulder internal rotation         Shoulder external rotation         Middle trapezius         Lower trapezius         Elbow flexion   3+/5 4/5 N/A 4/5  Elbow extension   3+/5 4/4 N/A 4/5  Wrist flexion         Wrist extension   2-/5 3-/5 3-/5 3/5  Wrist ulnar deviation         Wrist radial deviation         Wrist pronation         Wrist supination         (Blank rows = not tested)   HAND FUNCTION: Grip strength: Right: 26#, Left: 10# Pinch strength: Right 8#, Left: 3#, 3 Pt. Pinch strength: Right: 9#, L: 2#  01/22/2023 Grip strength: Right: 26#, Left: 12# Pinch strength:  Pinch meter used at the initial eval has been sent out for recalibration   03/03/2023 Grip strength: Right: 26#, Left: 13# Pinch strength:  Pinch meter used at the initial eval has been sent  out for recalibration  04/14/2023: Grip strength: Right: 26#, Left: 13# Pinch strength:   Right 8#, Left: 4#, 3 Pt. Pinch strength: Right: 9#, L: 3#     COORDINATION: Right: 22 sec., Left: <5 min. To place 7 pegs with increased compensation proximally in the trunk, and through reflexive  associated reactions.  01/22/23 Right: 22 sec., Left: 3 min. & 4 sec.  03/03/23 Right: 22 sec., Left: 1 min. & 39 sec.  04/14/23   TBD      SENSATION: Light touch: WFL, proprioceptive awareness: Intact   EDEMA: N/A   MUSCLE TONE: LUE: Hypotonic   COGNITION: Overall cognitive status: WFL for tasks assessed. Pt. Is impulsive at times.   VISION: Subjective report: Pt. report having shingles affecting left eye  s/p 1 year. Has nerve pain Baseline vision: Wears glasses for reading only Visual history:  updated see clinical impression   VISION ASSESSMENT:    WFL for tasks performed   PERCEPTION: Intact   PRAXIS: Impaired: Motor planning   OBSERVATIONS:  Pt. more alert, and engaging since prior to the most recent hospitalization.     TODAY'S TREATMENT:    Therapeutic Ex:   Pt. tolerated AROM/AAROM for the LUE. Pt. Worked on reps of 2nd digit extension utilizing yellow resistive clips and placed the on horizontal and vertical dowels encouraging combinations of movements with forearm pronation and supination.  Neuromuscular re-education:   Pt. worked on left hand [facility] skills using isolated 2nd digit extension to slide the card off the edge of table to the thumb for a 2 point grasp. Pt. Worked on combining movements with forearm supination prior to sorting them.        PATIENT EDUCATION: Education details: left hand digit extension Person educated: Patient and Spouse Education method: Medical illustrator Education comprehension: verbalized understanding, returned demonstration, and needs further education     HOME EXERCISE PROGRAM:    Reviewed activities at home to promote isolated 2nd digit extension.    GOALS: Goals reviewed with patient? Yes   SHORT TERM GOALS: Target date: 01/27/2023       1. Patient will be independent with home exercise program for the left upper extremity Baseline: 04/09/2023: Pt. Continues to consistently attempt to engage her  hand at hand 02/01/2023: Pt. Consistently attempts to perform HEPs independently. No current home exercise program Goal status: Ongoing     LONG TERM GOALS: Target date: 05/26/2023     Patient will improve left shoulder strength by 2 mm grades to be able to sustain UE's in elevation long enough to wash her hair.  Baseline: 04/09/2023: Pt. is limited with sustaining LUE elevation long enough to perform hair care thoroughly. 03/03/2023: Left shoulder flexion: 3-/5, abduction: 3-/5 01/22/2023: Left shoulder flexion: 3-/5, abduction: 3-/5 Eval: Left shoulder flexion: 3/5, abduction: 3-/5 Goal status: Ongoing   2.  Patient will improve left shoulder active abduction to be able to comb her hair Baseline:  04/09/2023: Pt. Pt. Presents with difficulty abducting her left shoulder enough to thoroughly complete haircare. 03/03/2023: Shoulder abduction: 85 01/22/2023: Shoulder abduction: 85 Eval: Left shoulder abduction is 80(108) Goal status: Ongoing   3.  Patient will independently button her shirt with modified independence. Baseline: 04/09/2023: Pt. Continues to progress towards buttoning. 03/03/2023: Pt. Continues to have difficulty with buttoning. 01/22/2023: Pt. continues to have difficulty. Eval: Patient has difficulty.  Goal status: Ongoing   4.  Patient well improve left grip strength in preparation for securely holding flowers. Baseline:04/09/2023: Pt. Is able to  hold, and hike pants with the left hand, continues to have difficulty with securely holding flowers.   03/03/2023: left grip strength: 13# 01/22/2023: Left: 12# Eval: Pt. Is unable to securely hold flowers. Goal status: Ongoing   5.  Pt. will independently recall adaptive  strategies for performing ADL tasks including: flossing teeth, donning bra, applying makeup. Baseline: 04/09/2023: Continue3/26/2024: Continue 01/22/2023: Pt. continues to benefit from education about adaptive strategies during ADLs, and IADLs. Eval: Pt. to be provided with  adaptive strategies. Goal status: Ongoing   6.  Pt. will improve Foto score by 2 points to reflect patient perceived performance improvement assessment specific ADLs  and IADLs Baseline: 8/29/562:  TBD 03/03/23: FOTO 62 Eval: 57 Goal status: Ongoing  7.  Pt. will improve left hand coordination skills in order to be able to handle, and sort utensils in a drawer.     Baseline:  04/14/2023: 56 04/09/2023: TBD 03/03/23: 1 min. & 39 01/22/2022: Left: 3 min. & 4 sec. Eval: Pt. has difficulty sorting, and placing utensils with the left hand. Left FMC : >5 min. For 7 pegs on the 9 hole peg test.    Goal Status:  Ongoing  8. Pt. will improve active left 2nd digit extension to be able able to isolate her 2nd digit in preparation for pressing/pushing buttons on appliances, phones, or remotes. Baseline: 04/09/2023: Pt. Is progressing with isolating left 2nd digit extension, however continues to present with limited increased flexor tone. 03/03/23: Pt. Continues to work on improving consistency with 2nd digit extension to press the remote. 01/22/2023: is able to perform full digit extension, although 2nd digit is slow to extend s 2/2 flexor tone. Pt.  Eval: Pt. is able to is unable to actively perform full digit extension Goal status:  Ongoing   ASSESSMENT:   CLINICAL IMPRESSION:  Pt. Presented with increased compensation proximally in the trunk with leaning when performing supination prior to placing and sorting the cards. Pt. Experienced difficulties when releasing cards for L hand and placing them in the pile. Pt. Required step by step verbal cues and assistance with distal digit extension when releasing cards. Pt. Continues to present with L shoulder limitations from a previous injury. Pt. Was able to start combinations of movement patterns and presented with increased 2nd digit extension today. Pt continues to work on improving L shoulder, elbow, and wrist strength, improving motor control and coordination in  order to prepare the left upper extremity and hand for functional reaching and engaging the upper left extremity when performing hair care, buttoning, holding flowers, cutting food, and manipulating utensils to set the table as well as adaptive strategies during daily IADL care.   PERFORMANCE DEFICITS in functional skills including ADLs, IADLs, coordination, proprioception, ROM, strength, FMC, and GMC, cognitive skills including memory, and psychosocial skills including coping strategies, environmental adaptation, interpersonal interactions, and routines and behaviors.    IMPAIRMENTS are limiting patient from ADLs, IADLs, education, leisure, and social participation.    COMORBIDITIES may have co-morbidities  that affects occupational performance. Patient will benefit from skilled OT to address above impairments and improve overall function.   MODIFICATION OR ASSISTANCE TO COMPLETE EVALUATION: Min-Moderate modification of tasks or assist with assess necessary to complete an evaluation.   OT OCCUPATIONAL PROFILE AND HISTORY: Detailed assessment: Review of records and additional review of physical, cognitive, psychosocial history related to current functional performance.   CLINICAL DECISION MAKING: Moderate - several treatment options, min-mod task modification necessary   REHAB POTENTIAL: Good  EVALUATION COMPLEXITY: Moderate      PLAN: OT FREQUENCY: 2x/week   OT DURATION: 12 weeks   PLANNED INTERVENTIONS: self care/ADL training, therapeutic exercise, therapeutic activity, neuromuscular re-education, manual therapy, passive range of motion, functional mobility training, electrical stimulation, and paraffin   RECOMMENDED OTHER SERVICES: PT   CONSULTED AND AGREED WITH PLAN OF CARE: Patient and family member/caregiver   PLAN FOR NEXT SESSION: Initiate OT treatment  Herma Carson, OTS   04/30/2023 5:10pm

## 2023-05-01 ENCOUNTER — Encounter: Payer: Medicare HMO | Attending: Physical Medicine & Rehabilitation | Admitting: Physical Medicine & Rehabilitation

## 2023-05-01 ENCOUNTER — Encounter: Payer: Self-pay | Admitting: Physical Medicine & Rehabilitation

## 2023-05-01 VITALS — BP 118/73 | HR 80 | Ht 59.0 in | Wt 123.0 lb

## 2023-05-01 DIAGNOSIS — G8194 Hemiplegia, unspecified affecting left nondominant side: Secondary | ICD-10-CM | POA: Diagnosis not present

## 2023-05-01 NOTE — Progress Notes (Signed)
Subjective:    Patient ID: [name], female    DOB: 1941/07/31, 82 y.o.   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 She underwent inpatient rehab program and made good functional gains. HPI  Patient continues to have some motor control problems left upper extremity.  She also has some difficulty extending the left index finger although this has improved over the last month or so.  She continues to receive outpatient PT and OT at [facility]. Knee pain continues on the left side, has knee orthosis which is helping with this. She would like to pursue a left TKR once cleared from a poststroke standpoint.  She is now approximately 5 months post stroke. No falls or new issues.  She requires assistance for meal prep she is able to feed dress bathe and toilet herself.  She does use a 3 wheeled walker.  ED visits or hospitalization in the interval Pain Inventory Average Pain 0 Pain Right Now 0 My pain is intermittent, sharp, and dull  LOCATION OF PAIN  HIPS, LEFT KNEE, LEGS, HEAD  BOWEL Number of stools per week: 1-2 a week Oral laxative use No  Type of laxative Prune Juice   BLADDER Normal and Pads I Bladder incontinence  sometimes at night   Mobility walk with assistance use a walker ability to climb steps?  yes do you drive?  yes needs help with transfers transfers alone Do you have any goals in this area?  no  Function retired I need assistance with the following:  Spouse does it all.  Do you have any goals in this area?  yes  Neuro/Psych weakness tremor trouble walking dizziness confusion anxiety  Prior Studies Any  changes since last visit?  no  Physicians involved in your care Any changes since last visit?  no   Family History  Problem Relation Age of Onset   Anesthesia problems Father        "bad lungs" couldn't wake him up   Heart disease Father    Heart attack Father 72   Breast cancer Maternal Grandmother    Breast cancer Maternal Aunt        x 2   Breast cancer Cousin    Pancreatic cancer Cousin    Heart attack Paternal Uncle    Stroke Paternal Grandfather    Social History   Socioeconomic History   Marital status: Married    Spouse name: [name]   Number of children: 1   Years of education: Not on file   Highest education level: Not on file  Occupational History   Occupation: retired  Tobacco Use   Smoking status: Never   Smokeless tobacco: Never  Vaping Use   Vaping Use: Never used  Substance and Sexual Activity   Alcohol use: Not Currently    Alcohol/week: 0.0 - 1.0 standard drinks of alcohol    Comment: wine once every 2-3 months   Drug use: Never   Sexual activity: Not on file  Other Topics Concern   Not on file  Social History Narrative   Not on file   Social Determinants of Health   Financial Resource Strain: Not on file  Food Insecurity: No  Food Insecurity (11/23/2022)   Hunger Vital Sign    Worried About Running Out of Food in the Last Year: Never true    Ran Out of Food in the Last Year: Never true  Transportation Needs: No Transportation Needs (11/23/2022)   PRAPARE - Administrator, Civil Service (Medical): No    Lack of Transportation (Non-Medical): No  Physical Activity: Not on file  Stress: Not on file  Social Connections: Not on file   Past Surgical History:  Procedure Laterality Date   ABDOMINAL HYSTERECTOMY     APPENDECTOMY     CARPAL TUNNEL RELEASE  10+ years ago   bilateral   EYE SURGERY  3 yrs ago   bilateral cataracts   FOOT OSTEOTOMY  6 weeks ago   Left foot: great, 2nd & 3rd   FOOT OSTEOTOMY  5 years ago   Right great  toe   HAND SURGERY Bilateral 2011-most recent   multiple hand surgeries, 2 on left, 3 on right   KNEE ARTHROPLASTY Right 04/28/2022   Procedure: COMPUTER ASSISTED TOTAL KNEE ARTHROPLASTY;  Surgeon: Donato Heinz, MD;  Location: [facility];  Service: Orthopedics;  Laterality: Right;   LOOP RECORDER INSERTION N/A 05/16/2020   Procedure: LOOP RECORDER INSERTION;  Surgeon: Marcina Millard, MD;  Location: [facility];  Service: Cardiovascular;  Laterality: N/A;   NASAL SINUS SURGERY  most recent 7-8 yrs ago   7 sinus surgeries    TRIGGER FINGER RELEASE  11/19/2011   Procedure: RELEASE TRIGGER FINGER/A-1 PULLEY;  Surgeon: Nicki Reaper, MD;  Location: [facility];  Service: Orthopedics;  Laterality: Right;  release a-1 pulley right index finger and cyst removal   WRIST GANGLION EXCISION  1980's   right   Past Medical History:  Diagnosis Date   Anemia    Arthritis    Asthma    uses inhaler just prior to surgery to avoid attack   Back pain    from previous injury   Complication of anesthesia    has woken  up during 2 different surgery   Depression    no current issue/treatment; situation   Gallstones    GERD (gastroesophageal reflux disease)    Hiatal hernia    patient does NOT have nerve/muscle disease   History of kidney stones    HLD (hyperlipidemia)    HTN (hypertension)    Hypothyroidism    Kidney stones    Knee pain    Nausea and vomiting 10/15/2022   Non-diabetic pancreatic hormone dysfunction years   pt. 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   Pancreatitis    Pneumonia    Seizures (HCC)    caused by dye injected during a procedure   Shortness of breath    with exertion   Sinus problem    frequent infections/congestion   Stroke (HCC) 2021   reports having CVA in 2021 and having mini strokes before that   Thyroid disease    Ht 4\' 11"  (1.499 m)   Wt 123 lb (55.8 kg)   BMI 24.84 kg/m   Opioid Risk Score:   Fall Risk Score:   `1  Depression screen [facility] 2/9     05/01/2023    3:26 PM 01/30/2023    1:53 PM 10/15/2022    1:02 PM 09/17/2022    1:49 PM  Depression screen PHQ 2/9  Decreased Interest 0 0 0 0  Down, Depressed, Hopeless 0 0 0 0  PHQ - 2 Score  0 0 0 0  Altered sleeping    3  Tired, decreased energy    3  Change in appetite    0  Feeling bad or failure about yourself     0  Trouble concentrating    0  Moving slowly or fidgety/restless    0  Suicidal thoughts    0  PHQ-9 Score    6  Difficult doing work/chores    Somewhat difficult    Review of Systems  Genitourinary:        Incontinence   Musculoskeletal:  Positive for gait problem.       Tremors  Neurological:  Positive for dizziness and weakness.       Anxiety  Psychiatric/Behavioral:  Positive for confusion.   All other systems reviewed and are negative.     Objective:   Physical Exam No acute distress Mood and affect appropriate Speech without dysarthria or aphasia, patient is verbose Motor strength is 4/5 in left deltoid bicep tricep grip flexor knee extensor ankle dorsiflexor 5/5 on the right side. There is mildly diminished finger thumb opposition on the left side compared to the right side. Tone MAS 2 at the index finger flexors on the left side otherwise MAS 1 at the wrist and finger flexors on the left side MAS 0 at the elbow flexor. Ambulation not tested patient does not have her walker along       Assessment & Plan:   1.  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  Continue outpatient therapy.  In addition she had cerebellar infarcts in December 2023 but has no significant limb ataxia she does have some balance issues. Do not think left index finger spasticity is significant enough to warrant botulinum toxin injection at this time. 2.  End-stage osteoarthritis left knee patient follow-up with orthopedics.  Surgical clearance from Dr. Sherryll Burger, neurology

## 2023-05-01 NOTE — Patient Instructions (Signed)
Please get surgical clearance from Dr Sherryll Burger

## 2023-05-05 ENCOUNTER — Ambulatory Visit: Payer: Medicare HMO

## 2023-05-05 ENCOUNTER — Ambulatory Visit: Payer: Medicare HMO | Admitting: Occupational Therapy

## 2023-05-05 DIAGNOSIS — M6281 Muscle weakness (generalized): Secondary | ICD-10-CM

## 2023-05-05 DIAGNOSIS — R262 Difficulty in walking, not elsewhere classified: Secondary | ICD-10-CM | POA: Diagnosis not present

## 2023-05-05 DIAGNOSIS — R278 Other lack of coordination: Secondary | ICD-10-CM | POA: Diagnosis not present

## 2023-05-05 DIAGNOSIS — R2681 Unsteadiness on feet: Secondary | ICD-10-CM | POA: Diagnosis not present

## 2023-05-05 NOTE — Therapy (Addendum)
Occupational Therapy Treatment Note   Patient Name: [name] MRN: [id number] DOB:12/16/1940, 82 y.o., female Today's Date: 05/05/2023  PCP: Dr. Judithann Sheen REFERRING PROVIDER:  Dr. Judithann Sheen  END OF SESSION:  OT End of Session - 05/05/23 1651     Visit Number 35 (P)     Number of Visits 48 (P)     Date for OT Re-Evaluation 05/26/23 (P)     OT Start Time 1605 (P)     OT Stop Time 1645 (P)     OT Time Calculation (min) 40 min (P)     Equipment Utilized During Treatment Wheelchair (P)     Activity Tolerance Patient tolerated treatment well (P)     Behavior During Therapy WFL for tasks assessed/performed (P)              Past Medical History:  Diagnosis Date   Anemia    Arthritis    Asthma    uses inhaler just prior to surgery to avoid attack   Back pain    from previous injury   Complication of anesthesia    has woken  up during 2 different surgery   Depression    no current issue/treatment; situation   Gallstones    GERD (gastroesophageal reflux disease)    Hiatal hernia    patient does NOT have nerve/muscle disease   History of kidney stones    HLD (hyperlipidemia)    HTN (hypertension)    Hypothyroidism    Kidney stones    Knee pain    Nausea and vomiting 10/15/2022   Non-diabetic pancreatic hormone dysfunction years   pt. 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   Pancreatitis    Pneumonia    Seizures (HCC)    caused by dye injected during a procedure   Shortness of breath    with exertion   Sinus problem    frequent infections/congestion   Stroke (HCC) 2021   reports having CVA in 2021 and having mini strokes before that   Thyroid disease    Past Surgical History:  Procedure Laterality Date   ABDOMINAL HYSTERECTOMY     APPENDECTOMY     CARPAL TUNNEL RELEASE  10+ years ago   bilateral   EYE SURGERY  3 yrs ago   bilateral cataracts   FOOT OSTEOTOMY  6 weeks ago   Left foot: great, 2nd & 3rd   FOOT OSTEOTOMY  5 years ago   Right great toe    HAND SURGERY Bilateral 2011-most recent   multiple hand surgeries, 2 on left, 3 on right   KNEE ARTHROPLASTY Right 04/28/2022   Procedure: COMPUTER ASSISTED TOTAL KNEE ARTHROPLASTY;  Surgeon: Donato Heinz, MD;  Location: [facility];  Service: Orthopedics;  Laterality: Right;   LOOP RECORDER INSERTION N/A 05/16/2020   Procedure: LOOP RECORDER INSERTION;  Surgeon: Marcina Millard, MD;  Location: [facility];  Service: Cardiovascular;  Laterality: N/A;   NASAL SINUS SURGERY  most recent 7-8 yrs ago   7 sinus surgeries    TRIGGER FINGER RELEASE  11/19/2011   Procedure: RELEASE TRIGGER FINGER/A-1 PULLEY;  Surgeon: Nicki Reaper, MD;  Location: [facility];  Service: Orthopedics;  Laterality: Right;  release a-1 pulley right index finger and cyst removal   WRIST GANGLION EXCISION  1980's   right   Patient Active Problem List   Diagnosis Date Noted   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 11/27/2022   Hypomagnesemia 11/27/2022   Occlusion of right vertebral  artery 11/23/2022   HLD (hyperlipidemia) 11/23/2022   Asthma 11/23/2022   Depression with anxiety 11/23/2022   Chronic diastolic CHF (congestive heart failure) (HCC) 11/23/2022   Normocytic anemia 11/23/2022   Aspiration pneumonia (HCC) 11/23/2022   AKI (acute kidney injury) (HCC) 11/23/2022   Abdominal pain 11/23/2022   Mesenteric mass 11/23/2022   Coffee ground emesis 11/23/2022   Nausea and vomiting 10/15/2022   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   Fatigue 09/17/2022   Left hemiparesis (HCC) 09/17/2022   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HCC) 08/22/2022   GERD (gastroesophageal reflux disease) 08/21/2022   Agitation 08/20/2022   Acute left-sided weakness 08/20/2022   Expressive aphasia    Stroke (HCC) 08/19/2022   Leukocytosis 08/19/2022   History of urticaria 04/28/2022   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   Primary osteoarthritis of left knee 02/24/2022   Primary osteoarthritis of right  knee 02/24/2022   Lumbar spondylolysis 04/12/2020   History of CVA (cerebrovascular accident) 03/26/2020   Low back pain radiating to right lower extremity 03/21/2020   B12 deficiency 03/06/2020   Positive anti-CCP test 12/21/2019   Arthralgia 12/13/2019   Dermatitis 12/13/2019   Rheumatoid factor positive 12/13/2019   Essential hypertension 12/11/2018   Palpitations 12/11/2018   Acquired hypothyroidism 11/10/2018   Arthritis of knee 09/17/2016   Anxiety 11/22/2014   Asthma without status asthmaticus 11/22/2014   Benign neoplasm of colon, unspecified 11/22/2014   Environmental allergies 11/22/2014   Hypertriglyceridemia 11/22/2014   Hypokalemia 11/22/2014   Personal history of disease of skin and subcutaneous tissue 11/22/2014   REFERRING DIAG: CVA   THERAPY DIAG:  Muscle weakness (generalized)   Other lack of coordination   Rationale for Evaluation and Treatment Rehabilitation   SUBJECTIVE:    Pt. Reports that she is going to be seen by neurologist and orthopedist for her knee in anticipation of knee surgery.   SUBJECTIVE STATEMENT:   Pt accompanied by: significant other   PERTINENT HISTORY: Patient is an 82 year-old female who was admitted to [facility] on 11/23/2022 with a cerebellar CVA. 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 Pt. Was admitted to Inpatient Rehab from 12/21-1/02/2023. 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  Patient underwent inpatient rehabilitation for 2 weeks.  Patient was assessed and was scheduled for a knee replacement on 08/29/2022 however had to cancel it due to having had a CVA.  Patient had a recent fall 2 days after discharging from inpatient rehab. Past Medical History includes: Knee replacement, essential HTN, hypokalemia, leukocytosis, seizures, positive anti-- CCP test, anxiety disorder, mini strokes.  Patient had shingles with left eye nerve pain s/p 1 year  ago.    PRECAUTIONS: Fall   WEIGHT BEARING RESTRICTIONS No   PAIN:  Are you having pain?  0/10 LUE    FALLS: Has patient fallen in last 6 months? Yes. Number of falls 1   LIVING ENVIRONMENT: Lives with: Lives with Spouse Lives in: House/apartment Stairs: 2 storey home, resides on the first floor.  External: 2 stairs front no rails, and 6 in back with rails Has following equipment at home: Single point cane, Walker - 2 wheeled, Environmental consultant - 4 wheeled, Shower bench, and bed side commode   PLOF: Independent   PATIENT GOALS  To Regain the use of her left arm   OBJECTIVE:    HAND DOMINANCE: Right   ADLs: Overall ADLs: Husband assists pt. as needed Transfers/ambulation related to ADLs:Pt. Uses a 3 wheeled walker with Husband  assist. Eating: Pt. Is independent with the right hand. Pt. has difficulty cutting food. Grooming: Pt. is using her right hand, however has difficulty sustaining her LUE in elevation to assist with haircare. UB Dressing: Pt. Is independent donning a pullover shirt, and button down shirt. Has difficulty with buttoning, LB Dressing:  Independent donning pants, and socks. Difficulty tying shoes. Toileting: Independent Bathing: Pt. Is able to engage her right hand. Tub Shower transfers: Supervision Equipment: See above for equipment     IADLs: Shopping:  Has not had the opportunity for grocery shopping yet Light housekeeping: Husband is assisting with light house keeping Meal Prep:  Dependent Community mobility: Relies of family/friends Medication management: Husband assisting with weekly pillbox set-up, and administering medication. Financial management: TBD Handwriting: 75% legible   MOBILITY STATUS: Hx of falls   POSTURE COMMENTS:  No Significant postural limitations Sitting balance: supported sitting balance WFL   ACTIVITY TOLERANCE: Activity tolerance:  Fatigues in greater than 30 min.    FUNCTIONAL OUTCOME MEASURES: FOTO: 57   UPPER EXTREMITY  ROM      Active ROM Right Eval: WFL Left eval Left  01/22/23 Left  03/03/23 Left  04/14/23  Shoulder flexion   132 100 108 108  Shoulder abduction   80 85 85 85  Shoulder adduction         Shoulder extension         Shoulder internal rotation         Shoulder external rotation         Elbow flexion   140 140 WFL WFL  Elbow extension   WNL WNL WFL WFL  Wrist flexion   65 68    Wrist extension   -10 20 24  32  Wrist ulnar deviation     12 10 14   Wrist radial deviation     8 14 14   Wrist pronation         Wrist supination         (Blank rows = not tested)   Left digit flexion to [facility]: 2nd: 0cm, 3rd: 0cm, 4th: 0cm, 5th: 0cm   Limited Left full 2nd digit extension     UPPER EXTREMITY MMT:      MMT Right Eval: 4+/5 overall Left Eval Left 01/22/23 Left 03/03/23 Left  04/14/2023  Shoulder flexion   3/5 3-/5 3-/5 3-/5  Shoulder abduction   3-/5 3-/5 3-/5 3-/5  Shoulder adduction         Shoulder extension         Shoulder internal rotation         Shoulder external rotation         Middle trapezius         Lower trapezius         Elbow flexion   3+/5 4/5 N/A 4/5  Elbow extension   3+/5 4/4 N/A 4/5  Wrist flexion         Wrist extension   2-/5 3-/5 3-/5 3/5  Wrist ulnar deviation         Wrist radial deviation         Wrist pronation         Wrist supination         (Blank rows = not tested)   HAND FUNCTION: Grip strength: Right: 26#, Left: 10# Pinch strength: Right 8#, Left: 3#, 3 Pt. Pinch strength: Right: 9#, L: 2#  01/22/2023 Grip strength: Right: 26#, Left: 12# Pinch strength:  Pinch meter used at the  initial eval has been sent out for recalibration   03/03/2023 Grip strength: Right: 26#, Left: 13# Pinch strength:  Pinch meter used at the initial eval has been sent out for recalibration  04/14/2023: Grip strength: Right: 26#, Left: 13# Pinch strength:   Right 8#, Left: 4#, 3 Pt. Pinch strength: Right: 9#, L: 3#     COORDINATION: Right: 22 sec., Left: <5 min.  To place 7 pegs with increased compensation proximally in the trunk, and through reflexive associated reactions.  01/22/23 Right: 22 sec., Left: 3 min. & 4 sec.  03/03/23 Right: 22 sec., Left: 1 min. & 39 sec.  04/14/23   TBD      SENSATION: Light touch: WFL, proprioceptive awareness: Intact   EDEMA: N/A   MUSCLE TONE: LUE: Hypotonic   COGNITION: Overall cognitive status: WFL for tasks assessed. Pt. Is impulsive at times.   VISION: Subjective report: Pt. report having shingles affecting left eye  s/p 1 year. Has nerve pain Baseline vision: Wears glasses for reading only Visual history:  updated see clinical impression   VISION ASSESSMENT:    WFL for tasks performed   PERCEPTION: Intact   PRAXIS: Impaired: Motor planning   OBSERVATIONS:  Pt. more alert, and engaging since prior to the most recent hospitalization.     TODAY'S TREATMENT:    Therapeutic Ex:   Pt. tolerated AROM/AAROM for the LUE. Pt. Worked on active supination followed by passive stretching to the end range of motion. Pt. Performed active supination with a hand held wooden dowel. Pt. Worked on supination with resistance using the EZ board with multiple handles for gross griping and 3 point pinch.   Neuromuscular re-education:   Pt. worked on left hand [facility] skills using isolated 2nd digit extension to slide dominos,1 inch square tiles, and cards off the edge of table to the thumb for a 2 point grasp. Pt. Worked on combining movements with forearm supination prior to sorting them.        PATIENT EDUCATION: Education details: left hand digit extension Person educated: Patient and Spouse Education method: Medical illustrator Education comprehension: verbalized understanding, returned demonstration, and needs further education     HOME EXERCISE PROGRAM:    Reviewed activities at home to promote isolated 2nd digit extension.    GOALS: Goals reviewed with patient? Yes   SHORT TERM GOALS:  Target date: 01/27/2023       1. Patient will be independent with home exercise program for the left upper extremity Baseline: 04/09/2023: Pt. Continues to consistently attempt to engage her hand at hand 02/01/2023: Pt. Consistently attempts to perform HEPs independently. No current home exercise program Goal status: Ongoing     LONG TERM GOALS: Target date: 05/26/2023     Patient will improve left shoulder strength by 2 mm grades to be able to sustain UE's in elevation long enough to wash her hair.  Baseline: 04/09/2023: Pt. is limited with sustaining LUE elevation long enough to perform hair care thoroughly. 03/03/2023: Left shoulder flexion: 3-/5, abduction: 3-/5 01/22/2023: Left shoulder flexion: 3-/5, abduction: 3-/5 Eval: Left shoulder flexion: 3/5, abduction: 3-/5 Goal status: Ongoing   2.  Patient will improve left shoulder active abduction to be able to comb her hair Baseline:  04/09/2023: Pt. Pt. Presents with difficulty abducting her left shoulder enough to thoroughly complete haircare. 03/03/2023: Shoulder abduction: 85 01/22/2023: Shoulder abduction: 85 Eval: Left shoulder abduction is 80(108) Goal status: Ongoing   3.  Patient will independently button her shirt with modified independence.  Baseline: 04/09/2023: Pt. Continues to progress towards buttoning. 03/03/2023: Pt. Continues to have difficulty with buttoning. 01/22/2023: Pt. continues to have difficulty. Eval: Patient has difficulty.  Goal status: Ongoing   4.  Patient well improve left grip strength in preparation for securely holding flowers. Baseline:04/09/2023: Pt. Is able to hold, and hike pants with the left hand, continues to have difficulty with securely holding flowers.   03/03/2023: left grip strength: 13# 01/22/2023: Left: 12# Eval: Pt. Is unable to securely hold flowers. Goal status: Ongoing   5.  Pt. will independently recall adaptive  strategies for performing ADL tasks including: flossing teeth, donning bra, applying  makeup. Baseline: 04/09/2023: Continue3/26/2024: Continue 01/22/2023: Pt. continues to benefit from education about adaptive strategies during ADLs, and IADLs. Eval: Pt. to be provided with adaptive strategies. Goal status: Ongoing   6.  Pt. will improve Foto score by 2 points to reflect patient perceived performance improvement assessment specific ADLs  and IADLs Baseline: 6/60/630:  TBD 03/03/23: FOTO 62 Eval: 57 Goal status: Ongoing  7.  Pt. will improve left hand coordination skills in order to be able to handle, and sort utensils in a drawer.     Baseline:  04/14/2023: 56 04/09/2023: TBD 03/03/23: 1 min. & 39 01/22/2022: Left: 3 min. & 4 sec. Eval: Pt. has difficulty sorting, and placing utensils with the left hand. Left FMC : >5 min. For 7 pegs on the 9 hole peg test.    Goal Status:  Ongoing  8. Pt. will improve active left 2nd digit extension to be able able to isolate her 2nd digit in preparation for pressing/pushing buttons on appliances, phones, or remotes. Baseline: 04/09/2023: Pt. Is progressing with isolating left 2nd digit extension, however continues to present with limited increased flexor tone. 03/03/23: Pt. Continues to work on improving consistency with 2nd digit extension to press the remote. 01/22/2023: is able to perform full digit extension, although 2nd digit is slow to extend s 2/2 flexor tone. Pt.  Eval: Pt. is able to is unable to actively perform full digit extension Goal status:  Ongoing   ASSESSMENT:   CLINICAL IMPRESSION:  Pt. Continues to present with increased compensation proximally in the trunk with leaning when performing supination prior to placing and sorting the cards. Pt. Experienced difficulties when grasping dominos and 1 inch tiles. Pt. Was able to grasp cards however, had some difficulties releasing them onto the table. Pt. Presented with increased tightness with L forearm supination however responded well to the treatment and following treatment presented with  increased active supination. Pt. Continues to present with L shoulder limitations from a previous injury. Pt. Continues to be able to start combinations of movement patterns and presented with increased 2nd digit extension today. Pt continues to work on improving L shoulder, elbow, and wrist strength, improving motor control and coordination in order to prepare the left upper extremity and hand for functional reaching and engaging the upper left extremity when performing hair care, buttoning, holding flowers, cutting food, and manipulating utensils to set the table as well as adaptive strategies during daily IADL care.   PERFORMANCE DEFICITS in functional skills including ADLs, IADLs, coordination, proprioception, ROM, strength, FMC, and GMC, cognitive skills including memory, and psychosocial skills including coping strategies, environmental adaptation, interpersonal interactions, and routines and behaviors.    IMPAIRMENTS are limiting patient from ADLs, IADLs, education, leisure, and social participation.    COMORBIDITIES may have co-morbidities  that affects occupational performance. Patient will benefit from skilled OT to address above  impairments and improve overall function.   MODIFICATION OR ASSISTANCE TO COMPLETE EVALUATION: Min-Moderate modification of tasks or assist with assess necessary to complete an evaluation.   OT OCCUPATIONAL PROFILE AND HISTORY: Detailed assessment: Review of records and additional review of physical, cognitive, psychosocial history related to current functional performance.   CLINICAL DECISION MAKING: Moderate - several treatment options, min-mod task modification necessary   REHAB POTENTIAL: Good   EVALUATION COMPLEXITY: Moderate      PLAN: OT FREQUENCY: 2x/week   OT DURATION: 12 weeks   PLANNED INTERVENTIONS: self care/ADL training, therapeutic exercise, therapeutic activity, neuromuscular re-education, manual therapy, passive range of motion, functional  mobility training, electrical stimulation, and paraffin   RECOMMENDED OTHER SERVICES: PT   CONSULTED AND AGREED WITH PLAN OF CARE: Patient and family member/caregiver   PLAN FOR NEXT SESSION: Initiate OT treatment  Herma Carson, OTS  05/05/2023 4:55 pm  This entire session was performed under the direct supervision and direction of a licensed therapist. I have personally read, edited, and approve of the note as written.   Olegario Messier, MS, OTR/L  05/05/2023

## 2023-05-05 NOTE — Therapy (Signed)
OUTPATIENT PHYSICAL THERAPY NEURO TREATMENT   Patient Name: [name] MRN: [id number] DOB:[birth year], 82 y.o., female Today's Date: 05/05/2023   PCP: Aram Beecham, D MD REFERRING PROVIDER: Jacquelynn Cree PA   END OF SESSION:  PT End of Session - 05/05/23 1522     Visit Number 4    Number of Visits 24    Date for PT Re-Evaluation 07/07/23    Authorization Type 3/10 eval 04/14/23    PT Start Time 1518    PT Stop Time 1600    PT Time Calculation (min) 42 min    Equipment Utilized During Treatment Gait belt    Activity Tolerance No increased pain;Patient tolerated treatment well    Behavior During Therapy [facility] for tasks assessed/performed               Past Medical History:  Diagnosis Date   Anemia    Arthritis    Asthma    uses inhaler just prior to surgery to avoid attack   Back pain    from previous injury   Complication of anesthesia    has woken  up during 2 different surgery   Depression    no current issue/treatment; situation   Gallstones    GERD (gastroesophageal reflux disease)    Hiatal hernia    patient does NOT have nerve/muscle disease   History of kidney stones    HLD (hyperlipidemia)    HTN (hypertension)    Hypothyroidism    Kidney stones    Knee pain    Nausea and vomiting 10/15/2022   Non-diabetic pancreatic hormone dysfunction years   pt. 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   Pancreatitis    Pneumonia    Seizures (HCC)    caused by dye injected during a procedure   Shortness of breath    with exertion   Sinus problem    frequent infections/congestion   Stroke (HCC) 2021   reports having CVA in 2021 and having mini strokes before that   Thyroid disease    Past Surgical History:  Procedure Laterality Date   ABDOMINAL HYSTERECTOMY     APPENDECTOMY     CARPAL TUNNEL RELEASE  10+ years ago   bilateral   EYE SURGERY  3 yrs ago   bilateral cataracts   FOOT OSTEOTOMY  6 weeks ago   Left foot: great, 2nd & 3rd   FOOT  OSTEOTOMY  5 years ago   Right great toe   HAND SURGERY Bilateral 2011-most recent   multiple hand surgeries, 2 on left, 3 on right   KNEE ARTHROPLASTY Right 04/28/2022   Procedure: COMPUTER ASSISTED TOTAL KNEE ARTHROPLASTY;  Surgeon: Donato Heinz, MD;  Location: [facility];  Service: Orthopedics;  Laterality: Right;   LOOP RECORDER INSERTION N/A 05/16/2020   Procedure: LOOP RECORDER INSERTION;  Surgeon: Marcina Millard, MD;  Location: [facility];  Service: Cardiovascular;  Laterality: N/A;   NASAL SINUS SURGERY  most recent 7-8 yrs ago   7 sinus surgeries    TRIGGER FINGER RELEASE  11/19/2011   Procedure: RELEASE TRIGGER FINGER/A-1 PULLEY;  Surgeon: Nicki Reaper, MD;  Location: [facility];  Service: Orthopedics;  Laterality: Right;  release a-1 pulley right index finger and cyst removal   WRIST GANGLION EXCISION  1980's   right   Patient Active Problem List   Diagnosis Date Noted   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 11/27/2022   Hypomagnesemia 11/27/2022   Occlusion of right  vertebral artery 11/23/2022   HLD (hyperlipidemia) 11/23/2022   Asthma 11/23/2022   Depression with anxiety 11/23/2022   Chronic diastolic CHF (congestive heart failure) (HCC) 11/23/2022   Normocytic anemia 11/23/2022   Aspiration pneumonia (HCC) 11/23/2022   AKI (acute kidney injury) (HCC) 11/23/2022   Abdominal pain 11/23/2022   Mesenteric mass 11/23/2022   Coffee ground emesis 11/23/2022   Nausea and vomiting 10/15/2022   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   Fatigue 09/17/2022   Left hemiparesis (HCC) 09/17/2022   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HCC) 08/22/2022   GERD (gastroesophageal reflux disease) 08/21/2022   Agitation 08/20/2022   Acute left-sided weakness 08/20/2022   Expressive aphasia    Stroke (HCC) 08/19/2022   Leukocytosis 08/19/2022   History of urticaria 04/28/2022   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   Primary osteoarthritis of left knee  02/24/2022   Primary osteoarthritis of right knee 02/24/2022   Lumbar spondylolysis 04/12/2020   History of CVA (cerebrovascular accident) 03/26/2020   Low back pain radiating to right lower extremity 03/21/2020   B12 deficiency 03/06/2020   Positive anti-CCP test 12/21/2019   Arthralgia 12/13/2019   Dermatitis 12/13/2019   Rheumatoid factor positive 12/13/2019   Essential hypertension 12/11/2018   Palpitations 12/11/2018   Acquired hypothyroidism 11/10/2018   Arthritis of knee 09/17/2016   Anxiety 11/22/2014   Asthma without status asthmaticus 11/22/2014   Benign neoplasm of colon, unspecified 11/22/2014   Environmental allergies 11/22/2014   Hypertriglyceridemia 11/22/2014   Hypokalemia 11/22/2014   Personal history of disease of skin and subcutaneous tissue 11/22/2014    ONSET DATE: 11/23/22  REFERRING DIAG: TIA  THERAPY DIAG:  Muscle weakness (generalized)  Difficulty in walking, not elsewhere classified  Rationale for Evaluation and Treatment: Rehabilitation  SUBJECTIVE:                                                                                                                                                                                             SUBJECTIVE STATEMENT: Pt reports she got a new kitten, Cooper.  Pt presents wearing L knee brace. She put medication on knee this morning. Feeling OK at the moment  Pt accompanied by: significant other  PERTINENT HISTORY: Patient is an 82 year-old female who was admitted to [facility] on 11/23/2022 with a cerebellar CVA. 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 Pt. Was admitted to Inpatient Rehab from 12/21-1/02/2023. 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  Patient underwent inpatient rehabilitation for 2 weeks.  Patient was assessed and was scheduled for a knee replacement on 08/29/2022 however had to cancel it due to  having had a  CVA.   PMH includes: anemia, arthritis, back pain, depression, hernia, GERD, HLD, HTN, hypothyroidism, knee pain, pancreatic hormone dysfunction, pneumonia, seizures, stroke, thyroid disease.  Patient has a donjoy brace for L knee. Walking with hand hold help with husband.   PAIN:  Are you having pain? Yes: NPRS scale: 9-10/10 Pain location: L knee Pain description: aching, gives out Aggravating factors: climbing into pain, moving it, walking around house Relieving factors: rest   PRECAUTIONS: Fall, has latex allergy  WEIGHT BEARING RESTRICTIONS: No  FALLS: Has patient fallen in last 6 months? Yes. Number of falls 4 falls   LIVING ENVIRONMENT: Lives with: lives with their family and lives with their spouse Lives in: House/apartment Stairs: Yes: Internal: flight steps;   and External: 2 steps; on right going up and on left going up Has following equipment at home: Single point cane, Walker - 2 wheeled, Shower bench, and bed side commode  PLOF: Independent  PATIENT GOALS: to have less pain and move better  OBJECTIVE:   DIAGNOSTIC FINDINGS:  Disc levels:   C2-3: No significant stenosis.   C3-4: A rightward disc osteophyte complex effaces the ventral CSF. Severe left and moderate right foraminal stenosis is present.   C4-5: A broad-based disc osteophyte complex effaces the ventral CSF. The canal is narrowed 7 mm. Severe left and moderate right foraminal stenosis is present.   C5-6: Chronic loss of disc height is present. Broad-based disc osteophyte complex is asymmetric to the right. Partial effacement of the ventral CSF is present. Moderate foraminal stenosis is worse right than left.   C6-7: A broad-based disc osteophyte complex is present. This partially effaces the ventral CSF. Moderate foraminal narrowing is worse right than left.   C7-T1: Uncovertebral spurring is present bilaterally. Mild bilateral foraminal stenosis is present.   IMPRESSION: 1. Multilevel  spondylosis of the cervical spine as described. 2. 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 3. 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 4. No other significant cord signal abnormality to suggest ischemic changes to the cord. 5. Severe left and moderate right foraminal stenosis at C3-4 and C4-5. 6. Moderate foraminal stenosis bilaterally at C5-6 and C6-7 is worse on the right. 7. Mild bilateral foraminal narrowing at C7-T1. 8. Prominent soft tissue pannus at C1-2 effaces the ventral CSF. 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  IMPRESSION: 1. 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 2. 9 mm right occipital pole infarct. 3. Punctate white matter infarct in the left occipital lobe. 4. Additional punctate cortical infarct in the medial right occipital lobe more superiorly. 5. Expected evolution of previous right posterior frontal lobe white matter infarct. 6. Stable atrophy and white matter disease likely reflects the sequela of chronic microvascular ischemia.  COGNITION: Overall cognitive status:  two strokes   SENSATION: WFL  COORDINATION: Heel slide test limited by LLE pain  POSTURE: rounded shoulders, forward head, and flexed trunk   LOWER EXTREMITY ROM:     Bowing of L knee  LOWER EXTREMITY MMT:    MMT Right Eval Left Eval  Hip flexion 3 3*  Hip extension    Hip abduction 3 3*  Hip adduction 3 *  Hip internal rotation    Hip external rotation    Knee flexion 3 3*  Knee extension 3 3*  Ankle dorsiflexion 4 4  Ankle plantarflexion 4 4  (  Blank rows = not tested) * pain  BED MOBILITY:  Per patient she has difficulty, occasionally needs help  TRANSFERS: Assistive device utilized: None  Sit to stand: CGA and Min A Stand to sit: Min A Chair to chair: CGA    GAIT: Gait  pattern: step to pattern, decreased stance time- Left, genu recurvatum- Left, and shuffling Distance walked: 10 ft Assistive device utilized: None Level of assistance: CGA Comments: Patient is very painful with ambulation, unable to ambulate longer duration   FUNCTIONAL TESTS:  5 times sit to stand: unable to tolerate 10 meter walk test: unable to tolerate  PATIENT SURVEYS:  FOTO 47  TODAY'S TREATMENT:                                                                                                                              DATE: 05/05/23   TherEx:  Supine: on large mat table SAQ 15x each LE, then with 2# AW donned 15x, 10x each LE   Bridges 1x10, 2x15 . Pt rates easy  Adduction ball squeeze 2x12 . Pt reports some discomfort from brace which improved with adjusting brace strap  Green pball hamstring curl 2x12. Moderate challenge   Abduction latex free GTB 2x20 bilat  Posterior pelvic tilt 10x; 2 sets. Difficulty with coordination , frequent cues for technique throughout  SLR 8x each LE    PATIENT EDUCATION: Education details: Pt educated throughout session about proper posture and technique with exercises. Improved exercise technique, movement at target joints, use of target muscles after min to mod verbal, visual, tactile cues.  Person educated: Patient and Spouse Education method: Explanation, Demonstration, Tactile cues, and Verbal cues Education comprehension: verbalized understanding, returned demonstration, verbal cues required, and tactile cues required  HOME EXERCISE PROGRAM: continue HEP as previously given Access Code: [email or web address] URL: [email or web address] Date: 04/14/2023 Prepared by: Precious Bard  Exercises - Seated Single Leg Hip Abduction  - 1 x daily - 7 x weekly - 2 sets - 10 reps - 5 hold - Seated Heel Slide  - 1 x daily - 7 x weekly - 2 sets - 10 reps - 5 hold - Seated Heel Toe Raises  - 1 x daily - 7 x weekly - 2 sets - 10 reps - 5  hold  GOALS: Goals reviewed with patient? Yes  SHORT TERM GOALS: Target date: 05/12/2023    Patient will be independent in home exercise program to improve strength/mobility for better functional independence with ADLs. Baseline: 5/7:  Goal status: INITIAL    LONG TERM GOALS: Target date: 07/07/2023    Patient will increase FOTO score to equal to or greater than   57%  to demonstrate statistically significant improvement in mobility and quality of life.  Baseline:  47% Goal status: INITIAL  2.  Patient (> 83 years old) will complete five times sit to stand test in < 15 seconds indicating an increased LE strength and improved balance. Baseline: 5/7: unable  to tolerate test Goal status: INITIAL  3.   Patient will deny any falls over past 4 weeks to demonstrate improved safety awareness at home and work.  Baseline: 5/7: multiple falls Goal status: INITIAL  4.  Patient will increase BLE gross strength to 4+/5 as to improve functional strength for independent gait, increased standing tolerance and increased ADL ability Baseline: 5/7: see above  Goal status: INITIAL  5.  Patient will be able to perform household work/ chores without increase in symptoms. Baseline: 5/7: unable to perform  Goal status: INITIAL    ASSESSMENT:  CLINICAL IMPRESSION: Pt able to tolerate interventions well this session without knee pain. She did experience some discomfort from her knee brace but this improved once strap adjusted.  Pt overall was able to progress volume of reps performed with majority of exercises. She had the most difficulty with pelvic tilts, where she struggled with technique. Patient will benefit from skilled physical therapy to reduce pain, improve mobility, improve strength and improve quality of life.    OBJECTIVE IMPAIRMENTS: Abnormal gait, cardiopulmonary status limiting activity, decreased activity tolerance, decreased balance, decreased cognition, decreased endurance, decreased  knowledge of use of DME, decreased mobility, difficulty walking, decreased ROM, decreased strength, decreased safety awareness, hypomobility, increased fascial restrictions, impaired perceived functional ability, impaired flexibility, impaired UE functional use, improper body mechanics, postural dysfunction, and pain.   ACTIVITY LIMITATIONS: carrying, lifting, bending, sitting, standing, squatting, stairs, transfers, bed mobility, bathing, toileting, dressing, reach over head, hygiene/grooming, locomotion level, and caring for others  PARTICIPATION LIMITATIONS: meal prep, cleaning, laundry, medication management, personal finances, interpersonal relationship, driving, shopping, community activity, yard work, school, and church  PERSONAL FACTORS: Age, Behavior pattern, Education, Fitness, Past/current experiences, Time since onset of injury/illness/exacerbation, and 3+ comorbidities: anemia, arthritis, back pain, depression, hernia, GERD, HLD, HTN, hypothyroidism, knee pain, pancreatic hormone dysfunction, pneumonia, seizures, stroke, thyroid disease  are also affecting patient's functional outcome.   REHAB POTENTIAL: Fair    CLINICAL DECISION MAKING: Evolving/moderate complexity  EVALUATION COMPLEXITY: Moderate  PLAN:  PT FREQUENCY: 1-2x/week  PT DURATION: 12 weeks  PLANNED INTERVENTIONS: Therapeutic exercises, Therapeutic activity, Neuromuscular re-education, Balance training, Gait training, Patient/Family education, Self Care, Joint mobilization, Joint manipulation, Stair training, Vestibular training, Canalith repositioning, Visual/preceptual remediation/compensation, Orthotic/Fit training, DME instructions, Electrical stimulation, Wheelchair mobility training, Spinal mobilization, Cryotherapy, Moist heat, Compression bandaging, scar mobilization, Splintting, Taping, Traction, Ultrasound, Manual therapy, and Re-evaluation  PLAN FOR NEXT SESSION: seated and supine strengthening of LLE     Baird Kay, PT 05/05/2023, 5:45 PM

## 2023-05-07 ENCOUNTER — Ambulatory Visit: Payer: Medicare HMO

## 2023-05-07 ENCOUNTER — Ambulatory Visit: Payer: Medicare HMO | Admitting: Occupational Therapy

## 2023-05-07 DIAGNOSIS — R278 Other lack of coordination: Secondary | ICD-10-CM | POA: Diagnosis not present

## 2023-05-07 DIAGNOSIS — M6281 Muscle weakness (generalized): Secondary | ICD-10-CM

## 2023-05-07 DIAGNOSIS — R2681 Unsteadiness on feet: Secondary | ICD-10-CM | POA: Diagnosis not present

## 2023-05-07 DIAGNOSIS — R262 Difficulty in walking, not elsewhere classified: Secondary | ICD-10-CM | POA: Diagnosis not present

## 2023-05-07 NOTE — Therapy (Addendum)
Occupational Therapy Treatment Note   Patient Name: [name] MRN: [id number] DOB:03-13-1941, 82 y.o., female Today's Date: 05/07/2023  PCP: Dr. Judithann Sheen REFERRING PROVIDER:  Dr. Judithann Sheen  END OF SESSION:  OT End of Session - 05/07/23 1605     Visit Number 36   Number of Visits 48    Date for OT Re-Evaluation 05/26/23    OT Start Time 1522    OT Stop Time 1600    OT Time Calculation (min) 38 min    Equipment Utilized During Treatment Wheelchair    Activity Tolerance Patient tolerated treatment well    Behavior During Therapy WFL for tasks assessed/performed             Past Medical History:  Diagnosis Date   Anemia    Arthritis    Asthma    uses inhaler just prior to surgery to avoid attack   Back pain    from previous injury   Complication of anesthesia    has woken  up during 2 different surgery   Depression    no current issue/treatment; situation   Gallstones    GERD (gastroesophageal reflux disease)    Hiatal hernia    patient does NOT have nerve/muscle disease   History of kidney stones    HLD (hyperlipidemia)    HTN (hypertension)    Hypothyroidism    Kidney stones    Knee pain    Nausea and vomiting 10/15/2022   Non-diabetic pancreatic hormone dysfunction years   pt. 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   Pancreatitis    Pneumonia    Seizures (HCC)    caused by dye injected during a procedure   Shortness of breath    with exertion   Sinus problem    frequent infections/congestion   Stroke (HCC) 2021   reports having CVA in 2021 and having mini strokes before that   Thyroid disease    Past Surgical History:  Procedure Laterality Date   ABDOMINAL HYSTERECTOMY     APPENDECTOMY     CARPAL TUNNEL RELEASE  10+ years ago   bilateral   EYE SURGERY  3 yrs ago   bilateral cataracts   FOOT OSTEOTOMY  6 weeks ago   Left foot: great, 2nd & 3rd   FOOT OSTEOTOMY  5 years ago   Right great toe   HAND SURGERY Bilateral 2011-most recent    multiple hand surgeries, 2 on left, 3 on right   KNEE ARTHROPLASTY Right 04/28/2022   Procedure: COMPUTER ASSISTED TOTAL KNEE ARTHROPLASTY;  Surgeon: Donato Heinz, MD;  Location: [facility];  Service: Orthopedics;  Laterality: Right;   LOOP RECORDER INSERTION N/A 05/16/2020   Procedure: LOOP RECORDER INSERTION;  Surgeon: Marcina Millard, MD;  Location: [facility];  Service: Cardiovascular;  Laterality: N/A;   NASAL SINUS SURGERY  most recent 7-8 yrs ago   7 sinus surgeries    TRIGGER FINGER RELEASE  11/19/2011   Procedure: RELEASE TRIGGER FINGER/A-1 PULLEY;  Surgeon: Nicki Reaper, MD;  Location: [facility];  Service: Orthopedics;  Laterality: Right;  release a-1 pulley right index finger and cyst removal   WRIST GANGLION EXCISION  1980's   right   Patient Active Problem List   Diagnosis Date Noted   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 11/27/2022   Hypomagnesemia 11/27/2022   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   HLD (hyperlipidemia) 11/23/2022   Asthma 11/23/2022   Depression with anxiety 11/23/2022  Chronic diastolic CHF (congestive heart failure) (HCC) 11/23/2022   Normocytic anemia 11/23/2022   Aspiration pneumonia (HCC) 11/23/2022   AKI (acute kidney injury) (HCC) 11/23/2022   Abdominal pain 11/23/2022   Mesenteric mass 11/23/2022   Coffee ground emesis 11/23/2022   Nausea and vomiting 10/15/2022   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   Fatigue 09/17/2022   Left hemiparesis (HCC) 09/17/2022   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HCC) 08/22/2022   GERD (gastroesophageal reflux disease) 08/21/2022   Agitation 08/20/2022   Acute left-sided weakness 08/20/2022   Expressive aphasia    Stroke (HCC) 08/19/2022   Leukocytosis 08/19/2022   History of urticaria 04/28/2022   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   Primary osteoarthritis of left knee 02/24/2022   Primary osteoarthritis of right knee 02/24/2022   Lumbar spondylolysis  04/12/2020   History of CVA (cerebrovascular accident) 03/26/2020   Low back pain radiating to right lower extremity 03/21/2020   B12 deficiency 03/06/2020   Positive anti-CCP test 12/21/2019   Arthralgia 12/13/2019   Dermatitis 12/13/2019   Rheumatoid factor positive 12/13/2019   Essential hypertension 12/11/2018   Palpitations 12/11/2018   Acquired hypothyroidism 11/10/2018   Arthritis of knee 09/17/2016   Anxiety 11/22/2014   Asthma without status asthmaticus 11/22/2014   Benign neoplasm of colon, unspecified 11/22/2014   Environmental allergies 11/22/2014   Hypertriglyceridemia 11/22/2014   Hypokalemia 11/22/2014   Personal history of disease of skin and subcutaneous tissue 11/22/2014   REFERRING DIAG: CVA   THERAPY DIAG:  Muscle weakness (generalized)   Other lack of coordination   Rationale for Evaluation and Treatment Rehabilitation   SUBJECTIVE:   Pt. reports that her spouse was helping her down the steps today and LUE was moved the wrong way which caused slight discomfort.   SUBJECTIVE STATEMENT:   Pt accompanied by: significant other   PERTINENT HISTORY: Patient is an 82 year-old female who was admitted to [facility] on 11/23/2022 with a cerebellar CVA. 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 Pt. Was admitted to Inpatient Rehab from 12/21-1/02/2023. 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  Patient underwent inpatient rehabilitation for 2 weeks.  Patient was assessed and was scheduled for a knee replacement on 08/29/2022 however had to cancel it due to having had a CVA.  Patient had a recent fall 2 days after discharging from inpatient rehab. Past Medical History includes: Knee replacement, essential HTN, hypokalemia, leukocytosis, seizures, positive anti-- CCP test, anxiety disorder, mini strokes.  Patient had shingles with left eye nerve pain s/p 1 year ago.    PRECAUTIONS: Fall    WEIGHT BEARING RESTRICTIONS No   PAIN:  Are you having pain?  0/10 LUE    FALLS: Has patient fallen in last 6 months? Yes. Number of falls 1   LIVING ENVIRONMENT: Lives with: Lives with Spouse Lives in: House/apartment Stairs: 2 storey home, resides on the first floor.  External: 2 stairs front no rails, and 6 in back with rails Has following equipment at home: Single point cane, Walker - 2 wheeled, Environmental consultant - 4 wheeled, Shower bench, and bed side commode   PLOF: Independent   PATIENT GOALS  To Regain the use of her left arm   OBJECTIVE:    HAND DOMINANCE: Right   ADLs: Overall ADLs: Husband assists pt. as needed Transfers/ambulation related to ADLs:Pt. Uses a 3 wheeled walker with Husband assist. Eating: Pt. Is independent with the right hand. Pt. has difficulty cutting food. Grooming: Pt. is using  her right hand, however has difficulty sustaining her LUE in elevation to assist with haircare. UB Dressing: Pt. Is independent donning a pullover shirt, and button down shirt. Has difficulty with buttoning, LB Dressing:  Independent donning pants, and socks. Difficulty tying shoes. Toileting: Independent Bathing: Pt. Is able to engage her right hand. Tub Shower transfers: Supervision Equipment: See above for equipment     IADLs: Shopping:  Has not had the opportunity for grocery shopping yet Light housekeeping: Husband is assisting with light house keeping Meal Prep:  Dependent Community mobility: Relies of family/friends Medication management: Husband assisting with weekly pillbox set-up, and administering medication. Financial management: TBD Handwriting: 75% legible   MOBILITY STATUS: Hx of falls   POSTURE COMMENTS:  No Significant postural limitations Sitting balance: supported sitting balance WFL   ACTIVITY TOLERANCE: Activity tolerance:  Fatigues in greater than 30 min.    FUNCTIONAL OUTCOME MEASURES: FOTO: 57   UPPER EXTREMITY ROM      Active ROM  Right Eval: WFL Left eval Left  01/22/23 Left  03/03/23 Left  04/14/23  Shoulder flexion   132 100 108 108  Shoulder abduction   80 85 85 85  Shoulder adduction         Shoulder extension         Shoulder internal rotation         Shoulder external rotation         Elbow flexion   140 140 WFL WFL  Elbow extension   WNL WNL WFL WFL  Wrist flexion   65 68    Wrist extension   -10 20 24  32  Wrist ulnar deviation     12 10 14   Wrist radial deviation     8 14 14   Wrist pronation         Wrist supination         (Blank rows = not tested)   Left digit flexion to [facility]: 2nd: 0cm, 3rd: 0cm, 4th: 0cm, 5th: 0cm   Limited Left full 2nd digit extension     UPPER EXTREMITY MMT:      MMT Right Eval: 4+/5 overall Left Eval Left 01/22/23 Left 03/03/23 Left  04/14/2023  Shoulder flexion   3/5 3-/5 3-/5 3-/5  Shoulder abduction   3-/5 3-/5 3-/5 3-/5  Shoulder adduction         Shoulder extension         Shoulder internal rotation         Shoulder external rotation         Middle trapezius         Lower trapezius         Elbow flexion   3+/5 4/5 N/A 4/5  Elbow extension   3+/5 4/4 N/A 4/5  Wrist flexion         Wrist extension   2-/5 3-/5 3-/5 3/5  Wrist ulnar deviation         Wrist radial deviation         Wrist pronation         Wrist supination         (Blank rows = not tested)   HAND FUNCTION: Grip strength: Right: 26#, Left: 10# Pinch strength: Right 8#, Left: 3#, 3 Pt. Pinch strength: Right: 9#, L: 2#  01/22/2023 Grip strength: Right: 26#, Left: 12# Pinch strength:  Pinch meter used at the initial eval has been sent out for recalibration   03/03/2023 Grip strength: Right: 26#, Left: 13# Pinch  strength:  Pinch meter used at the initial eval has been sent out for recalibration  04/14/2023: Grip strength: Right: 26#, Left: 13# Pinch strength:   Right 8#, Left: 4#, 3 Pt. Pinch strength: Right: 9#, L: 3#     COORDINATION: Right: 22 sec., Left: <5 min. To place 7 pegs with  increased compensation proximally in the trunk, and through reflexive associated reactions.  01/22/23 Right: 22 sec., Left: 3 min. & 4 sec.  03/03/23 Right: 22 sec., Left: 1 min. & 39 sec.  04/14/23   TBD      SENSATION: Light touch: WFL, proprioceptive awareness: Intact   EDEMA: N/A   MUSCLE TONE: LUE: Hypotonic   COGNITION: Overall cognitive status: WFL for tasks assessed. Pt. Is impulsive at times.   VISION: Subjective report: Pt. report having shingles affecting left eye  s/p 1 year. Has nerve pain Baseline vision: Wears glasses for reading only Visual history:  updated see clinical impression   VISION ASSESSMENT:    WFL for tasks performed   PERCEPTION: Intact   PRAXIS: Impaired: Motor planning   OBSERVATIONS:  Pt. more alert, and engaging since prior to the most recent hospitalization.     TODAY'S TREATMENT:    Therapeutic Ex:   Pt. tolerated AROM/AAROM for the LUE. Pt. Worked on active supination followed by passive stretching to the end range of motion. Pt. Performed active supination with a hand held wooden dowel. Pt. Worked on supination with resistance using the EZ board with multiple handles for gross griping and 3 point pinch. Pt. Worked on bilateral upper extremity strengthening and reciprocal motion using the UBE while seated for 8 minutes with no resistance when starting and adjusted to minimal resistance level during completion of the exercise. Constant monitoring was provided.  Neuromuscular re-education:   Pt. worked on left hand [facility] skills using isolated 2nd digit extension to slide cards off the edge of table to the thumb for a 2 point grasp. Pt. Worked on combining movements with forearm supination prior to sorting them.        PATIENT EDUCATION: Education details: left hand digit extension Person educated: Patient and Spouse Education method: Medical illustrator Education comprehension: verbalized understanding, returned  demonstration, and needs further education     HOME EXERCISE PROGRAM:    Reviewed activities at home to promote isolated 2nd digit extension.    GOALS: Goals reviewed with patient? Yes   SHORT TERM GOALS: Target date: 01/27/2023       1. Patient will be independent with home exercise program for the left upper extremity Baseline: 04/09/2023: Pt. Continues to consistently attempt to engage her hand at hand 02/01/2023: Pt. Consistently attempts to perform HEPs independently. No current home exercise program Goal status: Ongoing     LONG TERM GOALS: Target date: 05/26/2023     Patient will improve left shoulder strength by 2 mm grades to be able to sustain UE's in elevation long enough to wash her hair.  Baseline: 04/09/2023: Pt. is limited with sustaining LUE elevation long enough to perform hair care thoroughly. 03/03/2023: Left shoulder flexion: 3-/5, abduction: 3-/5 01/22/2023: Left shoulder flexion: 3-/5, abduction: 3-/5 Eval: Left shoulder flexion: 3/5, abduction: 3-/5 Goal status: Ongoing   2.  Patient will improve left shoulder active abduction to be able to comb her hair Baseline:  04/09/2023: Pt. Pt. Presents with difficulty abducting her left shoulder enough to thoroughly complete haircare. 03/03/2023: Shoulder abduction: 85 01/22/2023: Shoulder abduction: 85 Eval: Left shoulder abduction is 80(108) Goal status:  Ongoing   3.  Patient will independently button her shirt with modified independence. Baseline: 04/09/2023: Pt. Continues to progress towards buttoning. 03/03/2023: Pt. Continues to have difficulty with buttoning. 01/22/2023: Pt. continues to have difficulty. Eval: Patient has difficulty.  Goal status: Ongoing   4.  Patient well improve left grip strength in preparation for securely holding flowers. Baseline:04/09/2023: Pt. Is able to hold, and hike pants with the left hand, continues to have difficulty with securely holding flowers.   03/03/2023: left grip strength: 13# 01/22/2023:  Left: 12# Eval: Pt. Is unable to securely hold flowers. Goal status: Ongoing   5.  Pt. will independently recall adaptive  strategies for performing ADL tasks including: flossing teeth, donning bra, applying makeup. Baseline: 04/09/2023: Continue3/26/2024: Continue 01/22/2023: Pt. continues to benefit from education about adaptive strategies during ADLs, and IADLs. Eval: Pt. to be provided with adaptive strategies. Goal status: Ongoing   6.  Pt. will improve Foto score by 2 points to reflect patient perceived performance improvement assessment specific ADLs  and IADLs Baseline: 8/11/914:  TBD 03/03/23: FOTO 62 Eval: 57 Goal status: Ongoing  7.  Pt. will improve left hand coordination skills in order to be able to handle, and sort utensils in a drawer.     Baseline:  04/14/2023: 56 04/09/2023: TBD 03/03/23: 1 min. & 39 01/22/2022: Left: 3 min. & 4 sec. Eval: Pt. has difficulty sorting, and placing utensils with the left hand. Left FMC : >5 min. For 7 pegs on the 9 hole peg test.    Goal Status:  Ongoing  8. Pt. will improve active left 2nd digit extension to be able able to isolate her 2nd digit in preparation for pressing/pushing buttons on appliances, phones, or remotes. Baseline: 04/09/2023: Pt. Is progressing with isolating left 2nd digit extension, however continues to present with limited increased flexor tone. 03/03/23: Pt. Continues to work on improving consistency with 2nd digit extension to press the remote. 01/22/2023: is able to perform full digit extension, although 2nd digit is slow to extend s 2/2 flexor tone. Pt.  Eval: Pt. is able to is unable to actively perform full digit extension Goal status:  Ongoing   ASSESSMENT:   CLINICAL IMPRESSION:  Pt. reported intermittent discomfort when performing shoulder abduction during AROM however reported it got better throughout the session. Pt. Continues to present with increased compensation proximally in the trunk with leaning when performing  supination prior to placing and sorting the cards. Pt. Was able to grasp cards however, had some difficulties releasing them onto the table. Pt. Presented with decreased tightness with L forearm supination compared to last visit. Pt.responded well to the treatment and following treatment presented with increased active supination. Pt. Continues to present with L shoulder limitations from a previous injury. Pt. Continues to be able to start combinations of movement patterns and presented with increased 2nd digit extension today. Pt continues to work on improving L shoulder, elbow, and wrist strength, improving motor control and coordination in order to prepare the left upper extremity and hand for functional reaching and engaging the upper left extremity when performing hair care, buttoning, holding flowers, cutting food, and manipulating utensils to set the table as well as adaptive strategies during daily IADL care.   PERFORMANCE DEFICITS in functional skills including ADLs, IADLs, coordination, proprioception, ROM, strength, FMC, and GMC, cognitive skills including memory, and psychosocial skills including coping strategies, environmental adaptation, interpersonal interactions, and routines and behaviors.    IMPAIRMENTS are limiting patient from ADLs, IADLs, education,  leisure, and social participation.    COMORBIDITIES may have co-morbidities  that affects occupational performance. Patient will benefit from skilled OT to address above impairments and improve overall function.   MODIFICATION OR ASSISTANCE TO COMPLETE EVALUATION: Min-Moderate modification of tasks or assist with assess necessary to complete an evaluation.   OT OCCUPATIONAL PROFILE AND HISTORY: Detailed assessment: Review of records and additional review of physical, cognitive, psychosocial history related to current functional performance.   CLINICAL DECISION MAKING: Moderate - several treatment options, min-mod task modification  necessary   REHAB POTENTIAL: Good   EVALUATION COMPLEXITY: Moderate      PLAN: OT FREQUENCY: 2x/week   OT DURATION: 12 weeks   PLANNED INTERVENTIONS: self care/ADL training, therapeutic exercise, therapeutic activity, neuromuscular re-education, manual therapy, passive range of motion, functional mobility training, electrical stimulation, and paraffin   RECOMMENDED OTHER SERVICES: PT   CONSULTED AND AGREED WITH PLAN OF CARE: Patient and family member/caregiver   PLAN FOR NEXT SESSION: Initiate OT treatment  Herma Carson, OTS  05/07/2023 4:40 PM  This entire session was performed under the direct supervision and direction of a licensed therapist. I have personally read, edited, and approve of the note as written.   Olegario Messier, MS, OTR/L  05/07/2023 4:40 PM

## 2023-05-11 ENCOUNTER — Ambulatory Visit: Payer: Medicare HMO | Admitting: Physical Therapy

## 2023-05-12 ENCOUNTER — Ambulatory Visit: Payer: Medicare HMO | Attending: Physical Medicine and Rehabilitation | Admitting: Occupational Therapy

## 2023-05-12 DIAGNOSIS — R531 Weakness: Secondary | ICD-10-CM | POA: Diagnosis not present

## 2023-05-12 DIAGNOSIS — R112 Nausea with vomiting, unspecified: Secondary | ICD-10-CM | POA: Diagnosis not present

## 2023-05-12 DIAGNOSIS — M6281 Muscle weakness (generalized): Secondary | ICD-10-CM | POA: Insufficient documentation

## 2023-05-12 DIAGNOSIS — R2681 Unsteadiness on feet: Secondary | ICD-10-CM | POA: Diagnosis not present

## 2023-05-12 DIAGNOSIS — R278 Other lack of coordination: Secondary | ICD-10-CM | POA: Diagnosis not present

## 2023-05-12 DIAGNOSIS — R935 Abnormal findings on diagnostic imaging of other abdominal regions, including retroperitoneum: Secondary | ICD-10-CM | POA: Diagnosis not present

## 2023-05-12 DIAGNOSIS — Z8673 Personal history of transient ischemic attack (TIA), and cerebral infarction without residual deficits: Secondary | ICD-10-CM | POA: Diagnosis not present

## 2023-05-12 DIAGNOSIS — K5909 Other constipation: Secondary | ICD-10-CM | POA: Diagnosis not present

## 2023-05-12 DIAGNOSIS — R262 Difficulty in walking, not elsewhere classified: Secondary | ICD-10-CM | POA: Insufficient documentation

## 2023-05-12 NOTE — Therapy (Addendum)
Occupational Therapy Treatment Note   Patient Name: [name] MRN: [id number] DOB:03-13-1941, 82 y.o., female Today's Date: 05/12/2023  PCP: Dr. Judithann Sheen REFERRING PROVIDER:  Dr. Judithann Sheen  END OF SESSION:  OT End of Session - 05/12/23 1616     Visit Number 37 (P)     Number of Visits 48 (P)     Date for OT Re-Evaluation 05/26/23 (P)     OT Start Time 1515 (P)     OT Stop Time 1600 (P)     OT Time Calculation (min) 45 min (P)     Equipment Utilized During Treatment Wheelchair (P)     Activity Tolerance Patient tolerated treatment well (P)     Behavior During Therapy WFL for tasks assessed/performed (P)              Past Medical History:  Diagnosis Date   Anemia    Arthritis    Asthma    uses inhaler just prior to surgery to avoid attack   Back pain    from previous injury   Complication of anesthesia    has woken  up during 2 different surgery   Depression    no current issue/treatment; situation   Gallstones    GERD (gastroesophageal reflux disease)    Hiatal hernia    patient does NOT have nerve/muscle disease   History of kidney stones    HLD (hyperlipidemia)    HTN (hypertension)    Hypothyroidism    Kidney stones    Knee pain    Nausea and vomiting 10/15/2022   Non-diabetic pancreatic hormone dysfunction years   pt. 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   Pancreatitis    Pneumonia    Seizures (HCC)    caused by dye injected during a procedure   Shortness of breath    with exertion   Sinus problem    frequent infections/congestion   Stroke (HCC) 2021   reports having CVA in 2021 and having mini strokes before that   Thyroid disease    Past Surgical History:  Procedure Laterality Date   ABDOMINAL HYSTERECTOMY     APPENDECTOMY     CARPAL TUNNEL RELEASE  10+ years ago   bilateral   EYE SURGERY  3 yrs ago   bilateral cataracts   FOOT OSTEOTOMY  6 weeks ago   Left foot: great, 2nd & 3rd   FOOT OSTEOTOMY  5 years ago   Right great toe   HAND  SURGERY Bilateral 2011-most recent   multiple hand surgeries, 2 on left, 3 on right   KNEE ARTHROPLASTY Right 04/28/2022   Procedure: COMPUTER ASSISTED TOTAL KNEE ARTHROPLASTY;  Surgeon: Donato Heinz, MD;  Location: [facility];  Service: Orthopedics;  Laterality: Right;   LOOP RECORDER INSERTION N/A 05/16/2020   Procedure: LOOP RECORDER INSERTION;  Surgeon: Marcina Millard, MD;  Location: [facility];  Service: Cardiovascular;  Laterality: N/A;   NASAL SINUS SURGERY  most recent 7-8 yrs ago   7 sinus surgeries    TRIGGER FINGER RELEASE  11/19/2011   Procedure: RELEASE TRIGGER FINGER/A-1 PULLEY;  Surgeon: Nicki Reaper, MD;  Location: [facility];  Service: Orthopedics;  Laterality: Right;  release a-1 pulley right index finger and cyst removal   WRIST GANGLION EXCISION  1980's   right   Patient Active Problem List   Diagnosis Date Noted   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 11/27/2022   Hypomagnesemia 11/27/2022   Occlusion of right vertebral  artery 11/23/2022   HLD (hyperlipidemia) 11/23/2022   Asthma 11/23/2022   Depression with anxiety 11/23/2022   Chronic diastolic CHF (congestive heart failure) (HCC) 11/23/2022   Normocytic anemia 11/23/2022   Aspiration pneumonia (HCC) 11/23/2022   AKI (acute kidney injury) (HCC) 11/23/2022   Abdominal pain 11/23/2022   Mesenteric mass 11/23/2022   Coffee ground emesis 11/23/2022   Nausea and vomiting 10/15/2022   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   Fatigue 09/17/2022   Left hemiparesis (HCC) 09/17/2022   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HCC) 08/22/2022   GERD (gastroesophageal reflux disease) 08/21/2022   Agitation 08/20/2022   Acute left-sided weakness 08/20/2022   Expressive aphasia    Stroke (HCC) 08/19/2022   Leukocytosis 08/19/2022   History of urticaria 04/28/2022   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   Primary osteoarthritis of left knee 02/24/2022   Primary osteoarthritis of right knee  02/24/2022   Lumbar spondylolysis 04/12/2020   History of CVA (cerebrovascular accident) 03/26/2020   Low back pain radiating to right lower extremity 03/21/2020   B12 deficiency 03/06/2020   Positive anti-CCP test 12/21/2019   Arthralgia 12/13/2019   Dermatitis 12/13/2019   Rheumatoid factor positive 12/13/2019   Essential hypertension 12/11/2018   Palpitations 12/11/2018   Acquired hypothyroidism 11/10/2018   Arthritis of knee 09/17/2016   Anxiety 11/22/2014   Asthma without status asthmaticus 11/22/2014   Benign neoplasm of colon, unspecified 11/22/2014   Environmental allergies 11/22/2014   Hypertriglyceridemia 11/22/2014   Hypokalemia 11/22/2014   Personal history of disease of skin and subcutaneous tissue 11/22/2014   REFERRING DIAG: CVA   THERAPY DIAG:  Muscle weakness (generalized)   Castillo lack of coordination   Rationale for Evaluation and Treatment Rehabilitation   SUBJECTIVE:   Pt. reports still having discomfort in LUE from when her spouse was helping her down the steps and it being moved the wrong way.   SUBJECTIVE STATEMENT:   Pt accompanied by: [name]   PERTINENT HISTORY: Patient is an 82 year-old female who was admitted to [facility] on 11/23/2022 with a cerebellar CVA. 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 Pt. Was admitted to Inpatient Rehab from 12/21-1/02/2023. 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  Patient underwent inpatient rehabilitation for 2 weeks.  Patient was assessed and was scheduled for a knee replacement on 08/29/2022 however had to cancel it due to having had a CVA.  Patient had a recent fall 2 days after discharging from inpatient rehab. Past Medical History includes: Knee replacement, essential HTN, hypokalemia, leukocytosis, seizures, positive anti-- CCP test, anxiety disorder, mini strokes.  Patient had shingles with left eye nerve pain s/p 1  year ago.    PRECAUTIONS: Fall   WEIGHT BEARING RESTRICTIONS No   PAIN:  Are you having pain? 0/10 however, does report intermittent 8/10 pain in LUE while at home.   FALLS: Has patient fallen in last 6 months? Yes. Number of falls 1   LIVING ENVIRONMENT: Lives with: Lives with Spouse Lives in: House/apartment Stairs: 2 storey home, resides on the first floor.  External: 2 stairs front no rails, and 6 in back with rails Has following equipment at home: Single point cane, Walker - 2 wheeled, Environmental consultant - 4 wheeled, Shower bench, and bed side commode   PLOF: Independent   PATIENT GOALS  To Regain the use of her left arm   OBJECTIVE:    HAND DOMINANCE: Right   ADLs: Overall ADLs: Husband assists pt. as needed Transfers/ambulation  related to ADLs:Pt. Uses a 3 wheeled walker with Husband assist. Eating: Pt. Is independent with the right hand. Pt. has difficulty cutting food. Grooming: Pt. is using her right hand, however has difficulty sustaining her LUE in elevation to assist with haircare. UB Dressing: Pt. Is independent donning a pullover shirt, and button down shirt. Has difficulty with buttoning, LB Dressing:  Independent donning pants, and socks. Difficulty tying shoes. Toileting: Independent Bathing: Pt. Is able to engage her right hand. Tub Shower transfers: Supervision Equipment: See above for equipment     IADLs: Shopping:  Has not had the opportunity for grocery shopping yet Light housekeeping: Husband is assisting with light house keeping Meal Prep:  Dependent Community mobility: Relies of family/friends Medication management: Husband assisting with weekly pillbox set-up, and administering medication. Financial management: TBD Handwriting: 75% legible   MOBILITY STATUS: Hx of falls   POSTURE COMMENTS:  No Stephanie postural limitations Sitting balance: supported sitting balance WFL   ACTIVITY TOLERANCE: Activity tolerance:  Fatigues in greater than 30 min.     FUNCTIONAL OUTCOME MEASURES: FOTO: 57   UPPER EXTREMITY ROM      Active ROM Right Eval: WFL Left eval Left  01/22/23 Left  03/03/23 Left  04/14/23  Shoulder flexion   132 100 108 108  Shoulder abduction   80 85 85 85  Shoulder adduction         Shoulder extension         Shoulder internal rotation         Shoulder external rotation         Elbow flexion   140 140 WFL WFL  Elbow extension   WNL WNL WFL WFL  Wrist flexion   65 68    Wrist extension   -10 20 24  32  Wrist ulnar deviation     12 10 14   Wrist radial deviation     8 14 14   Wrist pronation         Wrist supination         (Blank rows = not tested)   Left digit flexion to [facility]: 2nd: 0cm, 3rd: 0cm, 4th: 0cm, 5th: 0cm   Limited Left full 2nd digit extension     UPPER EXTREMITY MMT:      MMT Right Eval: 4+/5 overall Left Eval Left 01/22/23 Left 03/03/23 Left  04/14/2023  Shoulder flexion   3/5 3-/5 3-/5 3-/5  Shoulder abduction   3-/5 3-/5 3-/5 3-/5  Shoulder adduction         Shoulder extension         Shoulder internal rotation         Shoulder external rotation         Middle trapezius         Lower trapezius         Elbow flexion   3+/5 4/5 N/A 4/5  Elbow extension   3+/5 4/4 N/A 4/5  Wrist flexion         Wrist extension   2-/5 3-/5 3-/5 3/5  Wrist ulnar deviation         Wrist radial deviation         Wrist pronation         Wrist supination         (Blank rows = not tested)   HAND FUNCTION: Grip strength: Right: 26#, Left: 10# Pinch strength: Right 8#, Left: 3#, 3 Pt. Pinch strength: Right: 9#, L: 2#  01/22/2023 Grip strength: Right: 26#,  Left: 12# Pinch strength:  Pinch meter used at the initial eval has been sent out for recalibration   03/03/2023 Grip strength: Right: 26#, Left: 13# Pinch strength:  Pinch meter used at the initial eval has been sent out for recalibration  04/14/2023: Grip strength: Right: 26#, Left: 13# Pinch strength:   Right 8#, Left: 4#, 3 Pt. Pinch strength: Right:  9#, L: 3#     COORDINATION: Right: 22 sec., Left: <5 min. To place 7 pegs with increased compensation proximally in the trunk, and through reflexive associated reactions.  01/22/23 Right: 22 sec., Left: 3 min. & 4 sec.  03/03/23 Right: 22 sec., Left: 1 min. & 39 sec.  04/14/23   TBD      SENSATION: Light touch: WFL, proprioceptive awareness: Intact   EDEMA: N/A   MUSCLE TONE: LUE: Hypotonic   COGNITION: Overall cognitive status: WFL for tasks assessed. Pt. Is impulsive at times.   VISION: Subjective report: Pt. report having shingles affecting left eye  s/p 1 year. Has nerve pain Baseline vision: Wears glasses for reading only Visual history:  updated see clinical impression   VISION ASSESSMENT:    WFL for tasks performed   PERCEPTION: Intact   PRAXIS: Impaired: Motor planning   OBSERVATIONS:  Pt. more alert, and engaging since prior to the most recent hospitalization.     TODAY'S TREATMENT:    Therapeutic Ex:   Pt. tolerated AROM/AAROM for the LUE. Pt. Worked on active supination followed by passive stretching to the end range of motion. Pt. Worked on bilateral upper extremity strengthening and reciprocal motion using the UBE while seated for 8 minutes with minimal resistance level during completion of the exercise. Constant monitoring was provided.   Neuromuscular re-education:   Pt. worked on left hand [facility] skills using isolated 2nd digit extension to slide cards off the edge of table to the thumb for a 2 point grasp. Pt. Worked on combining movements with forearm supination prior to placing card on table top. Pt. Participated in picking up 1 inch circular pegs using the 1st and 2nd digit and placing them onto elevated peg board. Pt. Removed pegs using tip pinch and placed them back into container followed by active finger extension.       PATIENT EDUCATION: Education details: left hand digit extension Person educated: Patient and Spouse Education method:  Medical illustrator Education comprehension: verbalized understanding, returned demonstration, and needs further education     HOME EXERCISE PROGRAM:    Reviewed activities at home to promote isolated 2nd digit extension.    GOALS: Goals reviewed with patient? Yes   SHORT TERM GOALS: Target date: 01/27/2023       1. Patient will be independent with home exercise program for the left upper extremity Baseline: 04/09/2023: Pt. Continues to consistently attempt to engage her hand at hand 02/01/2023: Pt. Consistently attempts to perform HEPs independently. No current home exercise program Goal status: Ongoing     LONG TERM GOALS: Target date: 05/26/2023     Patient will improve left shoulder strength by 2 mm grades to be able to sustain UE's in elevation long enough to wash her hair.  Baseline: 04/09/2023: Pt. is limited with sustaining LUE elevation long enough to perform hair care thoroughly. 03/03/2023: Left shoulder flexion: 3-/5, abduction: 3-/5 01/22/2023: Left shoulder flexion: 3-/5, abduction: 3-/5 Eval: Left shoulder flexion: 3/5, abduction: 3-/5 Goal status: Ongoing   2.  Patient will improve left shoulder active abduction to be able to comb her hair  Baseline:  04/09/2023: Pt. Pt. Presents with difficulty abducting her left shoulder enough to thoroughly complete haircare. 03/03/2023: Shoulder abduction: 85 01/22/2023: Shoulder abduction: 85 Eval: Left shoulder abduction is 80(108) Goal status: Ongoing   3.  Patient will independently button her shirt with modified independence. Baseline: 04/09/2023: Pt. Continues to progress towards buttoning. 03/03/2023: Pt. Continues to have difficulty with buttoning. 01/22/2023: Pt. continues to have difficulty. Eval: Patient has difficulty.  Goal status: Ongoing   4.  Patient well improve left grip strength in preparation for securely holding flowers. Baseline:04/09/2023: Pt. Is able to hold, and hike pants with the left hand, continues to  have difficulty with securely holding flowers.   03/03/2023: left grip strength: 13# 01/22/2023: Left: 12# Eval: Pt. Is unable to securely hold flowers. Goal status: Ongoing   5.  Pt. will independently recall adaptive  strategies for performing ADL tasks including: flossing teeth, donning bra, applying makeup. Baseline: 04/09/2023: Continue3/26/2024: Continue 01/22/2023: Pt. continues to benefit from education about adaptive strategies during ADLs, and IADLs. Eval: Pt. to be provided with adaptive strategies. Goal status: Ongoing   6.  Pt. will improve Foto score by 2 points to reflect patient perceived performance improvement assessment specific ADLs  and IADLs Baseline: 4/09/811:  TBD 03/03/23: FOTO 62 Eval: 57 Goal status: Ongoing  7.  Pt. will improve left hand coordination skills in order to be able to handle, and sort utensils in a drawer.     Baseline:  04/14/2023: 56 04/09/2023: TBD 03/03/23: 1 min. & 39 01/22/2022: Left: 3 min. & 4 sec. Eval: Pt. has difficulty sorting, and placing utensils with the left hand. Left FMC : >5 min. For 7 pegs on the 9 hole peg test.    Goal Status:  Ongoing  8. Pt. will improve active left 2nd digit extension to be able able to isolate her 2nd digit in preparation for pressing/pushing buttons on appliances, phones, or remotes. Baseline: 04/09/2023: Pt. Is progressing with isolating left 2nd digit extension, however continues to present with limited increased flexor tone. 03/03/23: Pt. Continues to work on improving consistency with 2nd digit extension to press the remote. 01/22/2023: is able to perform full digit extension, although 2nd digit is slow to extend s 2/2 flexor tone. Pt.  Eval: Pt. is able to is unable to actively perform full digit extension Goal status:  Ongoing   ASSESSMENT:   CLINICAL IMPRESSION:  Pt. reported she is still experiencing shoulder discomfort however reported no increase of pain throughout the session. Pt. Continues to present with  increased compensation proximally in the trunk with leaning when performing supination prior to placing and sorting the cards. Pt. Was able to grasp cards however, had some difficulties releasing them onto the table. Pt. Presented with decreased tightness with L forearm supination compared to last visit. Pt.responded well to the treatment and following treatment presented with increased active supination. Pt. Experienced difficulties when picking up and placing 1 inch circular pegs onto peg board and required increased verbal cues throughout. Pt. Continues to present with L shoulder limitations from a previous injury. Pt. Continues to be able to start combinations of movement patterns and presented with increased 2nd digit extension today. Pt continues to work on improving L shoulder, elbow, and wrist strength, improving motor control and coordination in order to prepare the left upper extremity and hand for functional reaching and engaging the upper left extremity when performing hair care, buttoning, holding flowers, cutting food, and manipulating utensils to set the table as well  as adaptive strategies during daily IADL care.   PERFORMANCE DEFICITS in functional skills including ADLs, IADLs, coordination, proprioception, ROM, strength, FMC, and GMC, cognitive skills including memory, and psychosocial skills including coping strategies, environmental adaptation, interpersonal interactions, and routines and behaviors.    IMPAIRMENTS are limiting patient from ADLs, IADLs, education, leisure, and social participation.    COMORBIDITIES may have co-morbidities  that affects occupational performance. Patient will benefit from skilled OT to address above impairments and improve overall function.   MODIFICATION OR ASSISTANCE TO COMPLETE EVALUATION: Min-Moderate modification of tasks or assist with assess necessary to complete an evaluation.   OT OCCUPATIONAL PROFILE AND HISTORY: Detailed assessment: Review of  records and additional review of physical, cognitive, psychosocial history related to current functional performance.   CLINICAL DECISION MAKING: Moderate - several treatment options, min-mod task modification necessary   REHAB POTENTIAL: Good   EVALUATION COMPLEXITY: Moderate      PLAN: OT FREQUENCY: 2x/week   OT DURATION: 12 weeks   PLANNED INTERVENTIONS: self care/ADL training, therapeutic exercise, therapeutic activity, neuromuscular re-education, manual therapy, passive range of motion, functional mobility training, electrical stimulation, and paraffin   RECOMMENDED Castillo SERVICES: PT   CONSULTED AND AGREED WITH PLAN OF CARE: Patient and family member/caregiver   PLAN FOR NEXT SESSION: Initiate OT treatment  Herma Carson, OTS  05/12/2023 5:15 PM  This entire session was performed under the direct supervision and direction of a licensed therapist. I have personally read, edited, and approve of the note as written.   Olegario Messier, MS, OTR/L  05/12/2023 5:15 PM

## 2023-05-14 ENCOUNTER — Ambulatory Visit: Payer: Medicare HMO

## 2023-05-14 ENCOUNTER — Ambulatory Visit: Payer: Medicare HMO | Admitting: Occupational Therapy

## 2023-05-14 DIAGNOSIS — M6281 Muscle weakness (generalized): Secondary | ICD-10-CM

## 2023-05-14 DIAGNOSIS — R531 Weakness: Secondary | ICD-10-CM

## 2023-05-14 DIAGNOSIS — R2681 Unsteadiness on feet: Secondary | ICD-10-CM

## 2023-05-14 DIAGNOSIS — R262 Difficulty in walking, not elsewhere classified: Secondary | ICD-10-CM | POA: Diagnosis not present

## 2023-05-14 DIAGNOSIS — R278 Other lack of coordination: Secondary | ICD-10-CM | POA: Diagnosis not present

## 2023-05-14 NOTE — Therapy (Addendum)
Occupational Therapy Treatment Note   Patient Name: [name] MRN: [id number] DOB:[birth year], 82 y.o., female Today's Date: 05/14/2023  PCP: Dr. Judithann Sheen REFERRING PROVIDER:  Dr. Judithann Sheen  END OF SESSION:  PLAN:  OT End of Session - 05/14/23 1655     Visit Number 38    Number of Visits 48    Date for OT Re-Evaluation 05/26/23    OT Start Time 1520    OT Stop Time 1600    OT Time Calculation (min) 40 min    Equipment Utilized During Treatment Wheelchair    Activity Tolerance Patient tolerated treatment well    Behavior During Therapy WFL for tasks assessed/performed               Past Medical History:  Diagnosis Date   Anemia    Arthritis    Asthma    uses inhaler just prior to surgery to avoid attack   Back pain    from previous injury   Complication of anesthesia    has woken  up during 2 different surgery   Depression    no current issue/treatment; situation   Gallstones    GERD (gastroesophageal reflux disease)    Hiatal hernia    patient does NOT have nerve/muscle disease   History of kidney stones    HLD (hyperlipidemia)    HTN (hypertension)    Hypothyroidism    Kidney stones    Knee pain    Nausea and vomiting 10/15/2022   Non-diabetic pancreatic hormone dysfunction years   pt. 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   Pancreatitis    Pneumonia    Seizures (HCC)    caused by dye injected during a procedure   Shortness of breath    with exertion   Sinus problem    frequent infections/congestion   Stroke (HCC) 2021   reports having CVA in 2021 and having mini strokes before that   Thyroid disease    Past Surgical History:  Procedure Laterality Date   ABDOMINAL HYSTERECTOMY     APPENDECTOMY     CARPAL TUNNEL RELEASE  10+ years ago   bilateral   EYE SURGERY  3 yrs ago   bilateral cataracts   FOOT OSTEOTOMY  6 weeks ago   Left foot: great, 2nd & 3rd   FOOT OSTEOTOMY  5 years ago   Right great toe   HAND SURGERY Bilateral 2011-most  recent   multiple hand surgeries, 2 on left, 3 on right   KNEE ARTHROPLASTY Right 04/28/2022   Procedure: COMPUTER ASSISTED TOTAL KNEE ARTHROPLASTY;  Surgeon: Donato Heinz, MD;  Location: [facility];  Service: Orthopedics;  Laterality: Right;   LOOP RECORDER INSERTION N/A 05/16/2020   Procedure: LOOP RECORDER INSERTION;  Surgeon: Marcina Millard, MD;  Location: [facility];  Service: Cardiovascular;  Laterality: N/A;   NASAL SINUS SURGERY  most recent 7-8 yrs ago   7 sinus surgeries    TRIGGER FINGER RELEASE  11/19/2011   Procedure: RELEASE TRIGGER FINGER/A-1 PULLEY;  Surgeon: Nicki Reaper, MD;  Location: [facility];  Service: Orthopedics;  Laterality: Right;  release a-1 pulley right index finger and cyst removal   WRIST GANGLION EXCISION  1980's   right   Patient Active Problem List   Diagnosis Date Noted   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 11/27/2022   Hypomagnesemia 11/27/2022   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   HLD (hyperlipidemia) 11/23/2022   Asthma 11/23/2022   Depression  with anxiety 11/23/2022   Chronic diastolic CHF (congestive heart failure) (HCC) 11/23/2022   Normocytic anemia 11/23/2022   Aspiration pneumonia (HCC) 11/23/2022   AKI (acute kidney injury) (HCC) 11/23/2022   Abdominal pain 11/23/2022   Mesenteric mass 11/23/2022   Coffee ground emesis 11/23/2022   Nausea and vomiting 10/15/2022   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   Fatigue 09/17/2022   Left hemiparesis (HCC) 09/17/2022   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HCC) 08/22/2022   GERD (gastroesophageal reflux disease) 08/21/2022   Agitation 08/20/2022   Acute left-sided weakness 08/20/2022   Expressive aphasia    Stroke (HCC) 08/19/2022   Leukocytosis 08/19/2022   History of urticaria 04/28/2022   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   Primary osteoarthritis of left knee 02/24/2022   Primary osteoarthritis of right knee 02/24/2022   Lumbar  spondylolysis 04/12/2020   History of CVA (cerebrovascular accident) 03/26/2020   Low back pain radiating to right lower extremity 03/21/2020   B12 deficiency 03/06/2020   Positive anti-CCP test 12/21/2019   Arthralgia 12/13/2019   Dermatitis 12/13/2019   Rheumatoid factor positive 12/13/2019   Essential hypertension 12/11/2018   Palpitations 12/11/2018   Acquired hypothyroidism 11/10/2018   Arthritis of knee 09/17/2016   Anxiety 11/22/2014   Asthma without status asthmaticus 11/22/2014   Benign neoplasm of colon, unspecified 11/22/2014   Environmental allergies 11/22/2014   Hypertriglyceridemia 11/22/2014   Hypokalemia 11/22/2014   Personal history of disease of skin and subcutaneous tissue 11/22/2014   REFERRING DIAG: CVA   THERAPY DIAG:  Muscle weakness (generalized)   Castillo lack of coordination   Rationale for Evaluation and Treatment Rehabilitation   SUBJECTIVE:   Pt. reports still having discomfort in LUE from when her spouse was helping her down the steps and it being moved the wrong way.   SUBJECTIVE STATEMENT:   Pt accompanied by: [name]   PERTINENT HISTORY: Patient is an 82 year-old female who was admitted to [facility] on 11/23/2022 with a cerebellar CVA. 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 Pt. Was admitted to Inpatient Rehab from 12/21-1/02/2023. 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  Patient underwent inpatient rehabilitation for 2 weeks.  Patient was assessed and was scheduled for a knee replacement on 08/29/2022 however had to cancel it due to having had a CVA.  Patient had a recent fall 2 days after discharging from inpatient rehab. Past Medical History includes: Knee replacement, essential HTN, hypokalemia, leukocytosis, seizures, positive anti-- CCP test, anxiety disorder, mini strokes.  Patient had shingles with left eye nerve pain s/p 1 year ago.     PRECAUTIONS: Fall   WEIGHT BEARING RESTRICTIONS No   PAIN:  Are you having pain? 0/10 however, does report intermittent 8/10 pain in LUE while at home.   FALLS: Has patient fallen in last 6 months? Yes. Number of falls 1   LIVING ENVIRONMENT: Lives with: Lives with Spouse Lives in: House/apartment Stairs: 2 storey home, resides on the first floor.  External: 2 stairs front no rails, and 6 in back with rails Has following equipment at home: Single point cane, Walker - 2 wheeled, Environmental consultant - 4 wheeled, Shower bench, and bed side commode   PLOF: Independent   PATIENT GOALS  To Regain the use of her left arm   OBJECTIVE:    HAND DOMINANCE: Right   ADLs: Overall ADLs: Husband assists pt. as needed Transfers/ambulation related to ADLs:Pt. Uses a 3 wheeled walker with Husband assist. Eating: Pt. Is  independent with the right hand. Pt. has difficulty cutting food. Grooming: Pt. is using her right hand, however has difficulty sustaining her LUE in elevation to assist with haircare. UB Dressing: Pt. Is independent donning a pullover shirt, and button down shirt. Has difficulty with buttoning, LB Dressing:  Independent donning pants, and socks. Difficulty tying shoes. Toileting: Independent Bathing: Pt. Is able to engage her right hand. Tub Shower transfers: Supervision Equipment: See above for equipment     IADLs: Shopping:  Has not had the opportunity for grocery shopping yet Light housekeeping: Husband is assisting with light house keeping Meal Prep:  Dependent Community mobility: Relies of family/friends Medication management: Husband assisting with weekly pillbox set-up, and administering medication. Financial management: TBD Handwriting: 75% legible   MOBILITY STATUS: Hx of falls   POSTURE COMMENTS:  No Stephanie postural limitations Sitting balance: supported sitting balance WFL   ACTIVITY TOLERANCE: Activity tolerance:  Fatigues in greater than 30 min.     FUNCTIONAL OUTCOME MEASURES: FOTO: 57   UPPER EXTREMITY ROM      Active ROM Right Eval: WFL Left eval Left  01/22/23 Left  03/03/23 Left  04/14/23  Shoulder flexion   132 100 108 108  Shoulder abduction   80 85 85 85  Shoulder adduction         Shoulder extension         Shoulder internal rotation         Shoulder external rotation         Elbow flexion   140 140 WFL WFL  Elbow extension   WNL WNL WFL WFL  Wrist flexion   65 68    Wrist extension   -10 20 24  32  Wrist ulnar deviation     12 10 14   Wrist radial deviation     8 14 14   Wrist pronation         Wrist supination         (Blank rows = not tested)   Left digit flexion to [facility]: 2nd: 0cm, 3rd: 0cm, 4th: 0cm, 5th: 0cm   Limited Left full 2nd digit extension     UPPER EXTREMITY MMT:      MMT Right Eval: 4+/5 overall Left Eval Left 01/22/23 Left 03/03/23 Left  04/14/2023  Shoulder flexion   3/5 3-/5 3-/5 3-/5  Shoulder abduction   3-/5 3-/5 3-/5 3-/5  Shoulder adduction         Shoulder extension         Shoulder internal rotation         Shoulder external rotation         Middle trapezius         Lower trapezius         Elbow flexion   3+/5 4/5 N/A 4/5  Elbow extension   3+/5 4/4 N/A 4/5  Wrist flexion         Wrist extension   2-/5 3-/5 3-/5 3/5  Wrist ulnar deviation         Wrist radial deviation         Wrist pronation         Wrist supination         (Blank rows = not tested)   HAND FUNCTION: Grip strength: Right: 26#, Left: 10# Pinch strength: Right 8#, Left: 3#, 3 Pt. Pinch strength: Right: 9#, L: 2#  01/22/2023 Grip strength: Right: 26#, Left: 12# Pinch strength:  Pinch meter used at the initial eval has been  sent out for recalibration   03/03/2023 Grip strength: Right: 26#, Left: 13# Pinch strength:  Pinch meter used at the initial eval has been sent out for recalibration  04/14/2023: Grip strength: Right: 26#, Left: 13# Pinch strength:   Right 8#, Left: 4#, 3 Pt. Pinch strength: Right:  9#, L: 3#     COORDINATION: Right: 22 sec., Left: <5 min. To place 7 pegs with increased compensation proximally in the trunk, and through reflexive associated reactions.  01/22/23 Right: 22 sec., Left: 3 min. & 4 sec.  03/03/23 Right: 22 sec., Left: 1 min. & 39 sec.  04/14/23   TBD      SENSATION: Light touch: WFL, proprioceptive awareness: Intact   EDEMA: N/A   MUSCLE TONE: LUE: Hypotonic   COGNITION: Overall cognitive status: WFL for tasks assessed. Pt. Is impulsive at times.   VISION: Subjective report: Pt. report having shingles affecting left eye  s/p 1 year. Has nerve pain Baseline vision: Wears glasses for reading only Visual history:  updated see clinical impression   VISION ASSESSMENT:    WFL for tasks performed   PERCEPTION: Intact   PRAXIS: Impaired: Motor planning   OBSERVATIONS:  Pt. more alert, and engaging since prior to the most recent hospitalization.     TODAY'S TREATMENT:    Therapeutic Ex:   Pt. tolerated AROM/AAROM for the LUE. Pt. worked on active supination followed by passive stretching to the end range of motion. Pt. Performed active supination with a hand held wooden dowel. Pt. worked on supination with resistance using the EZ board with multiple handles for gross gripping and 3 point pinch.    Neuromuscular re-education:   Pt. worked on left hand [facility] skills using isolated 2nd digit extension to slide sequencing cards off the edge of table to the thumb for a 2 point grasp. Pt. worked on combining movements with forearm supination prior to placing card on table top. Pt. Participated in picking up 1 inch circular checkers out of container and using the 1st and 2nd digit to place them onto the table top. Pt. Slid checkers off the edge of table and used a two point pinch to pick up and place the checkers back into container followed by alternating reps of  active 2nd through 5th digit extension.       PATIENT EDUCATION: Education details:  left hand digit extension Person educated: Patient and Spouse Education method: Medical illustrator Education comprehension: verbalized understanding, returned demonstration, and needs further education     HOME EXERCISE PROGRAM:    Reviewed activities at home to promote isolated 2nd digit extension.    GOALS: Goals reviewed with patient? Yes   SHORT TERM GOALS: Target date: 01/27/2023       1. Patient will be independent with home exercise program for the left upper extremity Baseline: 04/09/2023: Pt. Continues to consistently attempt to engage her hand at hand 02/01/2023: Pt. Consistently attempts to perform HEPs independently. No current home exercise program Goal status: Ongoing     LONG TERM GOALS: Target date: 05/26/2023     Patient will improve left shoulder strength by 2 mm grades to be able to sustain UE's in elevation long enough to wash her hair.  Baseline: 04/09/2023: Pt. is limited with sustaining LUE elevation long enough to perform hair care thoroughly. 03/03/2023: Left shoulder flexion: 3-/5, abduction: 3-/5 01/22/2023: Left shoulder flexion: 3-/5, abduction: 3-/5 Eval: Left shoulder flexion: 3/5, abduction: 3-/5 Goal status: Ongoing   2.  Patient will improve left  shoulder active abduction to be able to comb her hair Baseline:  04/09/2023: Pt. Pt. Presents with difficulty abducting her left shoulder enough to thoroughly complete haircare. 03/03/2023: Shoulder abduction: 85 01/22/2023: Shoulder abduction: 85 Eval: Left shoulder abduction is 80(108) Goal status: Ongoing   3.  Patient will independently button her shirt with modified independence. Baseline: 04/09/2023: Pt. Continues to progress towards buttoning. 03/03/2023: Pt. Continues to have difficulty with buttoning. 01/22/2023: Pt. continues to have difficulty. Eval: Patient has difficulty.  Goal status: Ongoing   4.  Patient well improve left grip strength in preparation for securely holding  flowers. Baseline:04/09/2023: Pt. Is able to hold, and hike pants with the left hand, continues to have difficulty with securely holding flowers.   03/03/2023: left grip strength: 13# 01/22/2023: Left: 12# Eval: Pt. Is unable to securely hold flowers. Goal status: Ongoing   5.  Pt. will independently recall adaptive  strategies for performing ADL tasks including: flossing teeth, donning bra, applying makeup. Baseline: 04/09/2023: Continue3/26/2024: Continue 01/22/2023: Pt. continues to benefit from education about adaptive strategies during ADLs, and IADLs. Eval: Pt. to be provided with adaptive strategies. Goal status: Ongoing   6.  Pt. will improve Foto score by 2 points to reflect patient perceived performance improvement assessment specific ADLs  and IADLs Baseline: 4/09/811:  TBD 03/03/23: FOTO 62 Eval: 57 Goal status: Ongoing  7.  Pt. will improve left hand coordination skills in order to be able to handle, and sort utensils in a drawer.     Baseline:  04/14/2023: 56 04/09/2023: TBD 03/03/23: 1 min. & 39 01/22/2022: Left: 3 min. & 4 sec. Eval: Pt. has difficulty sorting, and placing utensils with the left hand. Left FMC : >5 min. For 7 pegs on the 9 hole peg test.    Goal Status:  Ongoing  8. Pt. will improve active left 2nd digit extension to be able able to isolate her 2nd digit in preparation for pressing/pushing buttons on appliances, phones, or remotes. Baseline: 04/09/2023: Pt. Is progressing with isolating left 2nd digit extension, however continues to present with limited increased flexor tone. 03/03/23: Pt. Continues to work on improving consistency with 2nd digit extension to press the remote. 01/22/2023: is able to perform full digit extension, although 2nd digit is slow to extend s 2/2 flexor tone. Pt.  Eval: Pt. is able to is unable to actively perform full digit extension Goal status:  Ongoing   ASSESSMENT:   CLINICAL IMPRESSION:  Pt. presents with left shoulder discomfort during  shoulder abduction. Pt. continues to present with increased compensation proximally in the trunk with leaning, and the LLE extending when performing supination during EZ board exercises. Pt. was able to perform 10 reps on active supination with the wooden dowel. Pt. Was able to grasp sequencing stiff cardboard cards however, had some difficulties releasing them onto the table. Pt. was encouraged to lower the card to the tabletop before releasing to ensure the card remained upright.  Pt. Presented with decreased tightness with L forearm supination compared to last visit. Pt. responded well to the treatment and following treatment presented with increased active supination. Pt. Continues to present with L shoulder limitations from a previous injury. Pt. Continues to be able to start combinations of movement patterns and presented with increased 2nd digit extension. Pt continues to work on improving L shoulder, elbow, and wrist strength, improving motor control and coordination in order to prepare the left upper extremity and hand for functional reaching and engaging the upper left extremity  when performing hair care, buttoning, holding flowers, cutting food, and manipulating utensils to set the table as well as adaptive strategies during daily IADL care.   PERFORMANCE DEFICITS in functional skills including ADLs, IADLs, coordination, proprioception, ROM, strength, FMC, and GMC, cognitive skills including memory, and psychosocial skills including coping strategies, environmental adaptation, interpersonal interactions, and routines and behaviors.    IMPAIRMENTS are limiting patient from ADLs, IADLs, education, leisure, and social participation.    COMORBIDITIES may have co-morbidities  that affects occupational performance. Patient will benefit from skilled OT to address above impairments and improve overall function.   MODIFICATION OR ASSISTANCE TO COMPLETE EVALUATION: Min-Moderate modification of tasks or  assist with assess necessary to complete an evaluation.   OT OCCUPATIONAL PROFILE AND HISTORY: Detailed assessment: Review of records and additional review of physical, cognitive, psychosocial history related to current functional performance.   CLINICAL DECISION MAKING: Moderate - several treatment options, min-mod task modification necessary   REHAB POTENTIAL: Good   EVALUATION COMPLEXITY: Moderate      PLAN: OT FREQUENCY: 2x/week   OT DURATION: 12 weeks   PLANNED INTERVENTIONS: self care/ADL training, therapeutic exercise, therapeutic activity, neuromuscular re-education, manual therapy, passive range of motion, functional mobility training, electrical stimulation, and paraffin   RECOMMENDED Castillo SERVICES: PT   CONSULTED AND AGREED WITH PLAN OF CARE: Patient and family member/caregiver   PLAN FOR NEXT SESSION: Initiate OT treatment  Herma Carson, OTS  05/14/2023 5:15 PM  This entire session was performed under the direct supervision and direction of a licensed therapist. I have personally read, edited, and approve of the note as written.   Olegario Messier, MS, OTR/L  05/14/2023 5:15 PM

## 2023-05-14 NOTE — Therapy (Signed)
OUTPATIENT PHYSICAL THERAPY NEURO TREATMENT   Patient Name: [name] MRN: [id number] DOB:03-13-1941, 82 y.o., female Today's Date: 05/14/2023   PCP: Aram Beecham, D MD REFERRING PROVIDER: Jacquelynn Cree PA   END OF SESSION:  PT End of Session - 05/14/23 1606     Visit Number 5    Number of Visits 24    Date for PT Re-Evaluation 07/07/23    Authorization Type 3/10 eval 04/14/23    PT Start Time 1604    PT Stop Time 1644    PT Time Calculation (min) 40 min    Equipment Utilized During Treatment Gait belt    Activity Tolerance No increased pain;Patient tolerated treatment well    Behavior During Therapy [facility] for tasks assessed/performed               Past Medical History:  Diagnosis Date   Anemia    Arthritis    Asthma    uses inhaler just prior to surgery to avoid attack   Back pain    from previous injury   Complication of anesthesia    has woken  up during 2 different surgery   Depression    no current issue/treatment; situation   Gallstones    GERD (gastroesophageal reflux disease)    Hiatal hernia    patient does NOT have nerve/muscle disease   History of kidney stones    HLD (hyperlipidemia)    HTN (hypertension)    Hypothyroidism    Kidney stones    Knee pain    Nausea and vomiting 10/15/2022   Non-diabetic pancreatic hormone dysfunction years   pt. 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   Pancreatitis    Pneumonia    Seizures (HCC)    caused by dye injected during a procedure   Shortness of breath    with exertion   Sinus problem    frequent infections/congestion   Stroke (HCC) 2021   reports having CVA in 2021 and having mini strokes before that   Thyroid disease    Past Surgical History:  Procedure Laterality Date   ABDOMINAL HYSTERECTOMY     APPENDECTOMY     CARPAL TUNNEL RELEASE  10+ years ago   bilateral   EYE SURGERY  3 yrs ago   bilateral cataracts   FOOT OSTEOTOMY  6 weeks ago   Left foot: great, 2nd & 3rd   FOOT  OSTEOTOMY  5 years ago   Right great toe   HAND SURGERY Bilateral 2011-most recent   multiple hand surgeries, 2 on left, 3 on right   KNEE ARTHROPLASTY Right 04/28/2022   Procedure: COMPUTER ASSISTED TOTAL KNEE ARTHROPLASTY;  Surgeon: Donato Heinz, MD;  Location: [facility];  Service: Orthopedics;  Laterality: Right;   LOOP RECORDER INSERTION N/A 05/16/2020   Procedure: LOOP RECORDER INSERTION;  Surgeon: Marcina Millard, MD;  Location: [facility];  Service: Cardiovascular;  Laterality: N/A;   NASAL SINUS SURGERY  most recent 7-8 yrs ago   7 sinus surgeries    TRIGGER FINGER RELEASE  11/19/2011   Procedure: RELEASE TRIGGER FINGER/A-1 PULLEY;  Surgeon: Nicki Reaper, MD;  Location: [facility];  Service: Orthopedics;  Laterality: Right;  release a-1 pulley right index finger and cyst removal   WRIST GANGLION EXCISION  1980's   right   Patient Active Problem List   Diagnosis Date Noted   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 11/27/2022   Hypomagnesemia 11/27/2022   Occlusion of right  vertebral artery 11/23/2022   HLD (hyperlipidemia) 11/23/2022   Asthma 11/23/2022   Depression with anxiety 11/23/2022   Chronic diastolic CHF (congestive heart failure) (HCC) 11/23/2022   Normocytic anemia 11/23/2022   Aspiration pneumonia (HCC) 11/23/2022   AKI (acute kidney injury) (HCC) 11/23/2022   Abdominal pain 11/23/2022   Mesenteric mass 11/23/2022   Coffee ground emesis 11/23/2022   Nausea and vomiting 10/15/2022   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   Fatigue 09/17/2022   Left hemiparesis (HCC) 09/17/2022   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HCC) 08/22/2022   GERD (gastroesophageal reflux disease) 08/21/2022   Agitation 08/20/2022   Acute left-sided weakness 08/20/2022   Expressive aphasia    Stroke (HCC) 08/19/2022   Leukocytosis 08/19/2022   History of urticaria 04/28/2022   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   Primary osteoarthritis of left knee  02/24/2022   Primary osteoarthritis of right knee 02/24/2022   Lumbar spondylolysis 04/12/2020   History of CVA (cerebrovascular accident) 03/26/2020   Low back pain radiating to right lower extremity 03/21/2020   B12 deficiency 03/06/2020   Positive anti-CCP test 12/21/2019   Arthralgia 12/13/2019   Dermatitis 12/13/2019   Rheumatoid factor positive 12/13/2019   Essential hypertension 12/11/2018   Palpitations 12/11/2018   Acquired hypothyroidism 11/10/2018   Arthritis of knee 09/17/2016   Anxiety 11/22/2014   Asthma without status asthmaticus 11/22/2014   Benign neoplasm of colon, unspecified 11/22/2014   Environmental allergies 11/22/2014   Hypertriglyceridemia 11/22/2014   Hypokalemia 11/22/2014   Personal history of disease of skin and subcutaneous tissue 11/22/2014    ONSET DATE: 11/23/22  REFERRING DIAG: TIA  THERAPY DIAG:  Muscle weakness (generalized)  Difficulty in walking, not elsewhere classified  Other lack of coordination  Unsteadiness on feet  Acute left-sided weakness  Rationale for Evaluation and Treatment: Rehabilitation  SUBJECTIVE:                                                                                                                                                                                             SUBJECTIVE STATEMENT: Patient reports she is doing fairly well - states she was able to transfer on her own last night.   Pt accompanied by: significant other  PERTINENT HISTORY: Patient is an 82 year-old female who was admitted to [facility] on 11/23/2022 with a cerebellar CVA. 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 Pt. Was admitted to Inpatient Rehab from 12/21-1/02/2023. 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  Patient underwent inpatient rehabilitation for 2 weeks.  Patient was assessed and was scheduled for a knee replacement on 08/29/2022 however  had to cancel it due to having had a CVA.   PMH includes: anemia, arthritis, back pain, depression, hernia, GERD, HLD, HTN, hypothyroidism, knee pain, pancreatic hormone dysfunction, pneumonia, seizures, stroke, thyroid disease.  Patient has a donjoy brace for L knee. Walking with hand hold help with husband.   PAIN:  Are you having pain? Yes: NPRS scale: 9-10/10 Pain location: L knee Pain description: aching, gives out Aggravating factors: climbing into pain, moving it, walking around house Relieving factors: rest   PRECAUTIONS: Fall, has latex allergy  WEIGHT BEARING RESTRICTIONS: No  FALLS: Has patient fallen in last 6 months? Yes. Number of falls 4 falls   LIVING ENVIRONMENT: Lives with: lives with their family and lives with their spouse Lives in: House/apartment Stairs: Yes: Internal: flight steps;   and External: 2 steps; on right going up and on left going up Has following equipment at home: Single point cane, Walker - 2 wheeled, Shower bench, and bed side commode  PLOF: Independent  PATIENT GOALS: to have less pain and move better  OBJECTIVE:   DIAGNOSTIC FINDINGS:  Disc levels:   C2-3: No significant stenosis.   C3-4: A rightward disc osteophyte complex effaces the ventral CSF. Severe left and moderate right foraminal stenosis is present.   C4-5: A broad-based disc osteophyte complex effaces the ventral CSF. The canal is narrowed 7 mm. Severe left and moderate right foraminal stenosis is present.   C5-6: Chronic loss of disc height is present. Broad-based disc osteophyte complex is asymmetric to the right. Partial effacement of the ventral CSF is present. Moderate foraminal stenosis is worse right than left.   C6-7: A broad-based disc osteophyte complex is present. This partially effaces the ventral CSF. Moderate foraminal narrowing is worse right than left.   C7-T1: Uncovertebral spurring is present bilaterally. Mild bilateral foraminal stenosis is  present.   IMPRESSION: 1. Multilevel spondylosis of the cervical spine as described. 2. 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 3. 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 4. No other significant cord signal abnormality to suggest ischemic changes to the cord. 5. Severe left and moderate right foraminal stenosis at C3-4 and C4-5. 6. Moderate foraminal stenosis bilaterally at C5-6 and C6-7 is worse on the right. 7. Mild bilateral foraminal narrowing at C7-T1. 8. Prominent soft tissue pannus at C1-2 effaces the ventral CSF. 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  IMPRESSION: 1. 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 2. 9 mm right occipital pole infarct. 3. Punctate white matter infarct in the left occipital lobe. 4. Additional punctate cortical infarct in the medial right occipital lobe more superiorly. 5. Expected evolution of previous right posterior frontal lobe white matter infarct. 6. Stable atrophy and white matter disease likely reflects the sequela of chronic microvascular ischemia.  COGNITION: Overall cognitive status:  two strokes   SENSATION: WFL  COORDINATION: Heel slide test limited by LLE pain  POSTURE: rounded shoulders, forward head, and flexed trunk   LOWER EXTREMITY ROM:     Bowing of L knee  LOWER EXTREMITY MMT:    MMT Right Eval Left Eval  Hip flexion 3 3*  Hip extension    Hip abduction 3 3*  Hip adduction 3 *  Hip internal rotation    Hip external rotation    Knee flexion 3 3*  Knee extension 3 3*  Ankle dorsiflexion 4 4  Ankle plantarflexion 4 4  (Blank rows = not tested) * pain  BED MOBILITY:  Per patient she has difficulty, occasionally needs help  TRANSFERS: Assistive device utilized: None  Sit to stand: CGA and Min A Stand to sit: Min  A Chair to chair: CGA    GAIT: Gait pattern: step to pattern, decreased stance time- Left, genu recurvatum- Left, and shuffling Distance walked: 10 ft Assistive device utilized: None Level of assistance: CGA Comments: Patient is very painful with ambulation, unable to ambulate longer duration   FUNCTIONAL TESTS:  5 times sit to stand: unable to tolerate 10 meter walk test: unable to tolerate  PATIENT SURVEYS:  FOTO 47  TODAY'S TREATMENT:                                                                                                                              DATE: 05/14/23   TherEx:  Supine: on large mat table  SAQ 15x each LE, then with 2.5# AW donned 2 sets of 10 reps Heel slides with 2.5 # AW 2 sets of 10 reps  Hip abd/add up over 1/2 spike ball keeping leg straight - 2 sets of 10 reps Bridges 2 x 10 reps.  SLR 8x each LE Hip ABD (knees flexed) non - Latex RTB 2 sets of 15 reps  Sitting:   LAQ 2.5# 2 sets x 10 reps hamstring curl with GTB 2x12 reps        PATIENT EDUCATION: Education details: Pt educated throughout session about proper posture and technique with exercises. Improved exercise technique, movement at target joints, use of target muscles after min to mod verbal, visual, tactile cues.  Person educated: Patient and Spouse Education method: Explanation, Demonstration, Tactile cues, and Verbal cues Education comprehension: verbalized understanding, returned demonstration, verbal cues required, and tactile cues required  HOME EXERCISE PROGRAM: continue HEP as previously given Access Code: [email or web address] URL: [email or web address] Date: 04/14/2023 Prepared by: Precious Bard  Exercises - Seated Single Leg Hip Abduction  - 1 x daily - 7 x weekly - 2 sets - 10 reps - 5 hold - Seated Heel Slide  - 1 x daily - 7 x weekly - 2 sets - 10 reps - 5 hold - Seated Heel Toe Raises  - 1 x daily - 7 x weekly - 2 sets - 10 reps - 5 hold  GOALS: Goals  reviewed with patient? Yes  SHORT TERM GOALS: Target date: 05/12/2023    Patient will be independent in home exercise program to improve strength/mobility for better functional independence with ADLs. Baseline: 5/7:  Goal status: INITIAL    LONG TERM GOALS: Target date: 07/07/2023    Patient will increase FOTO score to equal to or greater than   57%  to demonstrate statistically significant improvement in mobility and quality of life.  Baseline:  47% Goal status: INITIAL  2.  Patient (> 33 years old) will complete five times sit to stand test  in < 15 seconds indicating an increased LE strength and improved balance. Baseline: 5/7: unable to tolerate test Goal status: INITIAL  3.   Patient will deny any falls over past 4 weeks to demonstrate improved safety awareness at home and work.  Baseline: 5/7: multiple falls Goal status: INITIAL  4.  Patient will increase BLE gross strength to 4+/5 as to improve functional strength for independent gait, increased standing tolerance and increased ADL ability Baseline: 5/7: see above  Goal status: INITIAL  5.  Patient will be able to perform household work/ chores without increase in symptoms. Baseline: 5/7: unable to perform  Goal status: INITIAL    ASSESSMENT:  CLINICAL IMPRESSION: Patient arrived with good motivation today and participated well with supine and later sitting therex. She reported no knee pain or issues with knee brace during session today.  She was able to follow all VC- some difficulty hearing but able to complete all therex well today including adding resistance. Patient will benefit from skilled physical therapy to reduce pain, improve mobility, improve strength and improve quality of life.    OBJECTIVE IMPAIRMENTS: Abnormal gait, cardiopulmonary status limiting activity, decreased activity tolerance, decreased balance, decreased cognition, decreased endurance, decreased knowledge of use of DME, decreased mobility,  difficulty walking, decreased ROM, decreased strength, decreased safety awareness, hypomobility, increased fascial restrictions, impaired perceived functional ability, impaired flexibility, impaired UE functional use, improper body mechanics, postural dysfunction, and pain.   ACTIVITY LIMITATIONS: carrying, lifting, bending, sitting, standing, squatting, stairs, transfers, bed mobility, bathing, toileting, dressing, reach over head, hygiene/grooming, locomotion level, and caring for others  PARTICIPATION LIMITATIONS: meal prep, cleaning, laundry, medication management, personal finances, interpersonal relationship, driving, shopping, community activity, yard work, school, and church  PERSONAL FACTORS: Age, Behavior pattern, Education, Fitness, Past/current experiences, Time since onset of injury/illness/exacerbation, and 3+ comorbidities: anemia, arthritis, back pain, depression, hernia, GERD, HLD, HTN, hypothyroidism, knee pain, pancreatic hormone dysfunction, pneumonia, seizures, stroke, thyroid disease  are also affecting patient's functional outcome.   REHAB POTENTIAL: Fair    CLINICAL DECISION MAKING: Evolving/moderate complexity  EVALUATION COMPLEXITY: Moderate  PLAN:  PT FREQUENCY: 1-2x/week  PT DURATION: 12 weeks  PLANNED INTERVENTIONS: Therapeutic exercises, Therapeutic activity, Neuromuscular re-education, Balance training, Gait training, Patient/Family education, Self Care, Joint mobilization, Joint manipulation, Stair training, Vestibular training, Canalith repositioning, Visual/preceptual remediation/compensation, Orthotic/Fit training, DME instructions, Electrical stimulation, Wheelchair mobility training, Spinal mobilization, Cryotherapy, Moist heat, Compression bandaging, scar mobilization, Splintting, Taping, Traction, Ultrasound, Manual therapy, and Re-evaluation  PLAN FOR NEXT SESSION: seated and supine strengthening of LLE    Lenda Kelp, PT 05/14/2023, 5:03  PM

## 2023-05-19 ENCOUNTER — Ambulatory Visit: Payer: Medicare HMO | Admitting: Occupational Therapy

## 2023-05-19 DIAGNOSIS — R262 Difficulty in walking, not elsewhere classified: Secondary | ICD-10-CM | POA: Diagnosis not present

## 2023-05-19 DIAGNOSIS — M6281 Muscle weakness (generalized): Secondary | ICD-10-CM

## 2023-05-19 DIAGNOSIS — R531 Weakness: Secondary | ICD-10-CM | POA: Diagnosis not present

## 2023-05-19 DIAGNOSIS — R278 Other lack of coordination: Secondary | ICD-10-CM | POA: Diagnosis not present

## 2023-05-19 DIAGNOSIS — R2681 Unsteadiness on feet: Secondary | ICD-10-CM | POA: Diagnosis not present

## 2023-05-21 ENCOUNTER — Ambulatory Visit: Payer: Medicare HMO | Admitting: Occupational Therapy

## 2023-05-21 ENCOUNTER — Ambulatory Visit: Payer: Medicare HMO

## 2023-05-21 ENCOUNTER — Encounter: Payer: Self-pay | Admitting: Occupational Therapy

## 2023-05-21 NOTE — Therapy (Signed)
Occupational Therapy Treatment Note   Patient Name: [name] MRN: [id number] DOB:Apr 07, 1941, 82 y.o., female Today's Date: 05/21/2023  PCP: Dr. Judithann Sheen REFERRING PROVIDER:  Dr. Judithann Sheen  END OF SESSION:  PLAN:  OT End of Session - 05/21/23 1112     Visit Number 39    Number of Visits 48    Date for OT Re-Evaluation 05/26/23    OT Start Time 1515    OT Stop Time 1600    OT Time Calculation (min) 45 min    Equipment Utilized During Treatment Wheelchair    Activity Tolerance Patient tolerated treatment well    Behavior During Therapy WFL for tasks assessed/performed               Past Medical History:  Diagnosis Date   Anemia    Arthritis    Asthma    uses inhaler just prior to surgery to avoid attack   Back pain    from previous injury   Complication of anesthesia    has woken  up during 2 different surgery   Depression    no current issue/treatment; situation   Gallstones    GERD (gastroesophageal reflux disease)    Hiatal hernia    patient does NOT have nerve/muscle disease   History of kidney stones    HLD (hyperlipidemia)    HTN (hypertension)    Hypothyroidism    Kidney stones    Knee pain    Nausea and vomiting 10/15/2022   Non-diabetic pancreatic hormone dysfunction years   pt. 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   Pancreatitis    Pneumonia    Seizures (HCC)    caused by dye injected during a procedure   Shortness of breath    with exertion   Sinus problem    frequent infections/congestion   Stroke (HCC) 2021   reports having CVA in 2021 and having mini strokes before that   Thyroid disease    Past Surgical History:  Procedure Laterality Date   ABDOMINAL HYSTERECTOMY     APPENDECTOMY     CARPAL TUNNEL RELEASE  10+ years ago   bilateral   EYE SURGERY  3 yrs ago   bilateral cataracts   FOOT OSTEOTOMY  6 weeks ago   Left foot: great, 2nd & 3rd   FOOT OSTEOTOMY  5 years ago   Right great toe   HAND SURGERY Bilateral 2011-most  recent   multiple hand surgeries, 2 on left, 3 on right   KNEE ARTHROPLASTY Right 04/28/2022   Procedure: COMPUTER ASSISTED TOTAL KNEE ARTHROPLASTY;  Surgeon: Donato Heinz, MD;  Location: [facility];  Service: Orthopedics;  Laterality: Right;   LOOP RECORDER INSERTION N/A 05/16/2020   Procedure: LOOP RECORDER INSERTION;  Surgeon: Marcina Millard, MD;  Location: [facility];  Service: Cardiovascular;  Laterality: N/A;   NASAL SINUS SURGERY  most recent 7-8 yrs ago   7 sinus surgeries    TRIGGER FINGER RELEASE  11/19/2011   Procedure: RELEASE TRIGGER FINGER/A-1 PULLEY;  Surgeon: Nicki Reaper, MD;  Location: [facility];  Service: Orthopedics;  Laterality: Right;  release a-1 pulley right index finger and cyst removal   WRIST GANGLION EXCISION  1980's   right   Patient Active Problem List   Diagnosis Date Noted   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 11/27/2022   Hypomagnesemia 11/27/2022   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   HLD (hyperlipidemia) 11/23/2022   Asthma 11/23/2022   Depression  with anxiety 11/23/2022   Chronic diastolic CHF (congestive heart failure) (HCC) 11/23/2022   Normocytic anemia 11/23/2022   Aspiration pneumonia (HCC) 11/23/2022   AKI (acute kidney injury) (HCC) 11/23/2022   Abdominal pain 11/23/2022   Mesenteric mass 11/23/2022   Coffee ground emesis 11/23/2022   Nausea and vomiting 10/15/2022   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   Fatigue 09/17/2022   Left hemiparesis (HCC) 09/17/2022   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HCC) 08/22/2022   GERD (gastroesophageal reflux disease) 08/21/2022   Agitation 08/20/2022   Acute left-sided weakness 08/20/2022   Expressive aphasia    Stroke (HCC) 08/19/2022   Leukocytosis 08/19/2022   History of urticaria 04/28/2022   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   Primary osteoarthritis of left knee 02/24/2022   Primary osteoarthritis of right knee 02/24/2022   Lumbar  spondylolysis 04/12/2020   History of CVA (cerebrovascular accident) 03/26/2020   Low back pain radiating to right lower extremity 03/21/2020   B12 deficiency 03/06/2020   Positive anti-CCP test 12/21/2019   Arthralgia 12/13/2019   Dermatitis 12/13/2019   Rheumatoid factor positive 12/13/2019   Essential hypertension 12/11/2018   Palpitations 12/11/2018   Acquired hypothyroidism 11/10/2018   Arthritis of knee 09/17/2016   Anxiety 11/22/2014   Asthma without status asthmaticus 11/22/2014   Benign neoplasm of colon, unspecified 11/22/2014   Environmental allergies 11/22/2014   Hypertriglyceridemia 11/22/2014   Hypokalemia 11/22/2014   Personal history of disease of skin and subcutaneous tissue 11/22/2014   REFERRING DIAG: CVA   THERAPY DIAG:  Muscle weakness (generalized)   Other lack of coordination   Rationale for Evaluation and Treatment Rehabilitation   SUBJECTIVE:   Pt reports pain 5/10 on left shoulder  SUBJECTIVE STATEMENT:   Pt accompanied by: significant other   PERTINENT HISTORY: Patient is an 82 year-old female who was admitted to [facility] on 11/23/2022 with a cerebellar CVA. 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 Pt. Was admitted to Inpatient Rehab from 12/21-1/02/2023. 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  Patient underwent inpatient rehabilitation for 2 weeks.  Patient was assessed and was scheduled for a knee replacement on 08/29/2022 however had to cancel it due to having had a CVA.  Patient had a recent fall 2 days after discharging from inpatient rehab. Past Medical History includes: Knee replacement, essential HTN, hypokalemia, leukocytosis, seizures, positive anti-- CCP test, anxiety disorder, mini strokes.  Patient had shingles with left eye nerve pain s/p 1 year ago.    PRECAUTIONS: Fall   WEIGHT BEARING RESTRICTIONS No   PAIN:  Are you having pain? 0/10  however, does report intermittent 8/10 pain in LUE while at home.   FALLS: Has patient fallen in last 6 months? Yes. Number of falls 1   LIVING ENVIRONMENT: Lives with: Lives with Spouse Lives in: House/apartment Stairs: 2 storey home, resides on the first floor.  External: 2 stairs front no rails, and 6 in back with rails Has following equipment at home: Single point cane, Walker - 2 wheeled, Environmental consultant - 4 wheeled, Shower bench, and bed side commode   PLOF: Independent   PATIENT GOALS  To Regain the use of her left arm   OBJECTIVE:    HAND DOMINANCE: Right   ADLs: Overall ADLs: Husband assists pt. as needed Transfers/ambulation related to ADLs:Pt. Uses a 3 wheeled walker with Husband assist. Eating: Pt. Is independent with the right hand. Pt. has difficulty cutting food. Grooming: Pt. is using her right hand, however  has difficulty sustaining her LUE in elevation to assist with haircare. UB Dressing: Pt. Is independent donning a pullover shirt, and button down shirt. Has difficulty with buttoning, LB Dressing:  Independent donning pants, and socks. Difficulty tying shoes. Toileting: Independent Bathing: Pt. Is able to engage her right hand. Tub Shower transfers: Supervision Equipment: See above for equipment     IADLs: Shopping:  Has not had the opportunity for grocery shopping yet Light housekeeping: Husband is assisting with light house keeping Meal Prep:  Dependent Community mobility: Relies of family/friends Medication management: Husband assisting with weekly pillbox set-up, and administering medication. Financial management: TBD Handwriting: 75% legible   MOBILITY STATUS: Hx of falls   POSTURE COMMENTS:  No Significant postural limitations Sitting balance: supported sitting balance WFL   ACTIVITY TOLERANCE: Activity tolerance:  Fatigues in greater than 30 min.    FUNCTIONAL OUTCOME MEASURES: FOTO: 57   UPPER EXTREMITY ROM      Active ROM Right Eval: WFL  Left eval Left  01/22/23 Left  03/03/23 Left  04/14/23  Shoulder flexion   132 100 108 108  Shoulder abduction   80 85 85 85  Shoulder adduction         Shoulder extension         Shoulder internal rotation         Shoulder external rotation         Elbow flexion   140 140 WFL WFL  Elbow extension   WNL WNL WFL WFL  Wrist flexion   65 68    Wrist extension   -10 20 24  32  Wrist ulnar deviation     12 10 14   Wrist radial deviation     8 14 14   Wrist pronation         Wrist supination         (Blank rows = not tested)   Left digit flexion to [facility]: 2nd: 0cm, 3rd: 0cm, 4th: 0cm, 5th: 0cm   Limited Left full 2nd digit extension     UPPER EXTREMITY MMT:      MMT Right Eval: 4+/5 overall Left Eval Left 01/22/23 Left 03/03/23 Left  04/14/2023  Shoulder flexion   3/5 3-/5 3-/5 3-/5  Shoulder abduction   3-/5 3-/5 3-/5 3-/5  Shoulder adduction         Shoulder extension         Shoulder internal rotation         Shoulder external rotation         Middle trapezius         Lower trapezius         Elbow flexion   3+/5 4/5 N/A 4/5  Elbow extension   3+/5 4/4 N/A 4/5  Wrist flexion         Wrist extension   2-/5 3-/5 3-/5 3/5  Wrist ulnar deviation         Wrist radial deviation         Wrist pronation         Wrist supination         (Blank rows = not tested)   HAND FUNCTION: Grip strength: Right: 26#, Left: 10# Pinch strength: Right 8#, Left: 3#, 3 Pt. Pinch strength: Right: 9#, L: 2#  01/22/2023 Grip strength: Right: 26#, Left: 12# Pinch strength:  Pinch meter used at the initial eval has been sent out for recalibration   03/03/2023 Grip strength: Right: 26#, Left: 13# Pinch strength:  Pinch meter  used at the initial eval has been sent out for recalibration  04/14/2023: Grip strength: Right: 26#, Left: 13# Pinch strength:   Right 8#, Left: 4#, 3 Pt. Pinch strength: Right: 9#, L: 3#     COORDINATION: Right: 22 sec., Left: <5 min. To place 7 pegs with increased  compensation proximally in the trunk, and through reflexive associated reactions.  01/22/23 Right: 22 sec., Left: 3 min. & 4 sec.  03/03/23 Right: 22 sec., Left: 1 min. & 39 sec.  04/14/23   TBD      SENSATION: Light touch: WFL, proprioceptive awareness: Intact   EDEMA: N/A   MUSCLE TONE: LUE: Hypotonic   COGNITION: Overall cognitive status: WFL for tasks assessed. Pt. Is impulsive at times.   VISION: Subjective report: Pt. report having shingles affecting left eye  s/p 1 year. Has nerve pain Baseline vision: Wears glasses for reading only Visual history:  updated see clinical impression   VISION ASSESSMENT:    WFL for tasks performed   PERCEPTION: Intact   PRAXIS: Impaired: Motor planning   OBSERVATIONS:  Pt. more alert, and engaging since prior to the most recent hospitalization.     TODAY'S TREATMENT:   Neuromuscular re-education:  Pt seen for manipulation of [place] discs from tabletop to pick up and place into grid, cues for index extension and assist at times with facilitation of reaching patterns to table.  She is unable to manipulate discs for flipping from one side to the other.   Manipulation of jumbo pegs to place into judy board with assist for tip to tip prehension patterns and reach.  Pt demonstrated difficulty with removing pegs from board, tending to pull items to the side and not in a straight line out of the space.         PATIENT EDUCATION: Education details: left hand digit extension Person educated: Patient and Spouse Education method: Medical illustrator Education comprehension: verbalized understanding, returned demonstration, and needs further education     HOME EXERCISE PROGRAM:    Reviewed activities at home to promote isolated 2nd digit extension.    GOALS: Goals reviewed with patient? Yes   SHORT TERM GOALS: Target date: 01/27/2023       1. Patient will be independent with home exercise program for the left upper  extremity Baseline: 04/09/2023: Pt. Continues to consistently attempt to engage her hand at hand 02/01/2023: Pt. Consistently attempts to perform HEPs independently. No current home exercise program Goal status: Ongoing     LONG TERM GOALS: Target date: 05/26/2023     Patient will improve left shoulder strength by 2 mm grades to be able to sustain UE's in elevation long enough to wash her hair.  Baseline: 04/09/2023: Pt. is limited with sustaining LUE elevation long enough to perform hair care thoroughly. 03/03/2023: Left shoulder flexion: 3-/5, abduction: 3-/5 01/22/2023: Left shoulder flexion: 3-/5, abduction: 3-/5 Eval: Left shoulder flexion: 3/5, abduction: 3-/5 Goal status: Ongoing   2.  Patient will improve left shoulder active abduction to be able to comb her hair Baseline:  04/09/2023: Pt. Pt. Presents with difficulty abducting her left shoulder enough to thoroughly complete haircare. 03/03/2023: Shoulder abduction: 85 01/22/2023: Shoulder abduction: 85 Eval: Left shoulder abduction is 80(108) Goal status: Ongoing   3.  Patient will independently button her shirt with modified independence. Baseline: 04/09/2023: Pt. Continues to progress towards buttoning. 03/03/2023: Pt. Continues to have difficulty with buttoning. 01/22/2023: Pt. continues to have difficulty. Eval: Patient has difficulty.  Goal status: Ongoing  4.  Patient well improve left grip strength in preparation for securely holding flowers. Baseline:04/09/2023: Pt. Is able to hold, and hike pants with the left hand, continues to have difficulty with securely holding flowers.   03/03/2023: left grip strength: 13# 01/22/2023: Left: 12# Eval: Pt. Is unable to securely hold flowers. Goal status: Ongoing   5.  Pt. will independently recall adaptive  strategies for performing ADL tasks including: flossing teeth, donning bra, applying makeup. Baseline: 04/09/2023: Continue3/26/2024: Continue 01/22/2023: Pt. continues to benefit from education  about adaptive strategies during ADLs, and IADLs. Eval: Pt. to be provided with adaptive strategies. Goal status: Ongoing   6.  Pt. will improve Foto score by 2 points to reflect patient perceived performance improvement assessment specific ADLs  and IADLs Baseline: 3/08/657:  TBD 03/03/23: FOTO 62 Eval: 57 Goal status: Ongoing  7.  Pt. will improve left hand coordination skills in order to be able to handle, and sort utensils in a drawer.     Baseline:  04/14/2023: 56 04/09/2023: TBD 03/03/23: 1 min. & 39 01/22/2022: Left: 3 min. & 4 sec. Eval: Pt. has difficulty sorting, and placing utensils with the left hand. Left FMC : >5 min. For 7 pegs on the 9 hole peg test.    Goal Status:  Ongoing  8. Pt. will improve active left 2nd digit extension to be able able to isolate her 2nd digit in preparation for pressing/pushing buttons on appliances, phones, or remotes. Baseline: 04/09/2023: Pt. Is progressing with isolating left 2nd digit extension, however continues to present with limited increased flexor tone. 03/03/23: Pt. Continues to work on improving consistency with 2nd digit extension to press the remote. 01/22/2023: is able to perform full digit extension, although 2nd digit is slow to extend s 2/2 flexor tone. Pt.  Eval: Pt. is able to is unable to actively perform full digit extension Goal status:  Ongoing   ASSESSMENT:   CLINICAL IMPRESSION: Pt continues to report some pain in left shoulder area.  She demonstrates difficulty with reaching patterns for engagement in tasks at tabletop level.  Left index tends to move into flexion with reach and demonstrates difficulty with extension of index to use with attempts at tip to tip prehension patterns.  She is improving with gross grasping patterns and working towards improving release of objects.  Continue to work towards goals in plan of care to maximize safety and independence in necessary daily tasks at home and in the community.    PERFORMANCE DEFICITS  in functional skills including ADLs, IADLs, coordination, proprioception, ROM, strength, FMC, and GMC, cognitive skills including memory, and psychosocial skills including coping strategies, environmental adaptation, interpersonal interactions, and routines and behaviors.    IMPAIRMENTS are limiting patient from ADLs, IADLs, education, leisure, and social participation.    COMORBIDITIES may have co-morbidities  that affects occupational performance. Patient will benefit from skilled OT to address above impairments and improve overall function.   MODIFICATION OR ASSISTANCE TO COMPLETE EVALUATION: Min-Moderate modification of tasks or assist with assess necessary to complete an evaluation.   OT OCCUPATIONAL PROFILE AND HISTORY: Detailed assessment: Review of records and additional review of physical, cognitive, psychosocial history related to current functional performance.   CLINICAL DECISION MAKING: Moderate - several treatment options, min-mod task modification necessary   REHAB POTENTIAL: Good   EVALUATION COMPLEXITY: Moderate      PLAN: OT FREQUENCY: 2x/week   OT DURATION: 12 weeks   PLANNED INTERVENTIONS: self care/ADL training, therapeutic exercise, therapeutic activity, neuromuscular re-education, manual  therapy, passive range of motion, functional mobility training, electrical stimulation, and paraffin   RECOMMENDED OTHER SERVICES: PT   CONSULTED AND AGREED WITH PLAN OF CARE: Patient and family member/caregiver   PLAN FOR NEXT SESSION: Initiate OT treatment  Kymia Simi T Zakariyya Helfman, OTR/L, CLT  Larance Ratledge, OT 05/21/2023, 11:26 AM

## 2023-05-26 ENCOUNTER — Ambulatory Visit: Payer: Medicare HMO | Admitting: Occupational Therapy

## 2023-05-26 ENCOUNTER — Ambulatory Visit: Payer: Medicare HMO | Admitting: Physical Therapy

## 2023-05-26 ENCOUNTER — Encounter: Payer: Self-pay | Admitting: Physical Therapy

## 2023-05-26 DIAGNOSIS — R262 Difficulty in walking, not elsewhere classified: Secondary | ICD-10-CM

## 2023-05-26 DIAGNOSIS — M6281 Muscle weakness (generalized): Secondary | ICD-10-CM | POA: Diagnosis not present

## 2023-05-26 DIAGNOSIS — R278 Other lack of coordination: Secondary | ICD-10-CM | POA: Diagnosis not present

## 2023-05-26 DIAGNOSIS — R2681 Unsteadiness on feet: Secondary | ICD-10-CM

## 2023-05-26 DIAGNOSIS — R531 Weakness: Secondary | ICD-10-CM | POA: Diagnosis not present

## 2023-05-26 NOTE — Therapy (Signed)
OUTPATIENT PHYSICAL THERAPY NEURO TREATMENT   Patient Name: [name] MRN: [id number] DOB:1941-08-31, 82 y.o., female Today's Date: 05/26/2023   PCP: Aram Beecham, D MD REFERRING PROVIDER: Jacquelynn Cree PA   END OF SESSION:  PT End of Session - 05/26/23 1255     Visit Number 6    Number of Visits 24    Date for PT Re-Evaluation 07/07/23    Authorization Type 3/10 eval 04/14/23    PT Start Time 1345    PT Stop Time 1428    PT Time Calculation (min) 43 min    Equipment Utilized During Treatment Gait belt    Activity Tolerance No increased pain;Patient tolerated treatment well    Behavior During Therapy [facility] for tasks assessed/performed                Past Medical History:  Diagnosis Date   Anemia    Arthritis    Asthma    uses inhaler just prior to surgery to avoid attack   Back pain    from previous injury   Complication of anesthesia    has woken  up during 2 different surgery   Depression    no current issue/treatment; situation   Gallstones    GERD (gastroesophageal reflux disease)    Hiatal hernia    patient does NOT have nerve/muscle disease   History of kidney stones    HLD (hyperlipidemia)    HTN (hypertension)    Hypothyroidism    Kidney stones    Knee pain    Nausea and vomiting 10/15/2022   Non-diabetic pancreatic hormone dysfunction years   pt. 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   Pancreatitis    Pneumonia    Seizures (HCC)    caused by dye injected during a procedure   Shortness of breath    with exertion   Sinus problem    frequent infections/congestion   Stroke (HCC) 2021   reports having CVA in 2021 and having mini strokes before that   Thyroid disease    Past Surgical History:  Procedure Laterality Date   ABDOMINAL HYSTERECTOMY     APPENDECTOMY     CARPAL TUNNEL RELEASE  10+ years ago   bilateral   EYE SURGERY  3 yrs ago   bilateral cataracts   FOOT OSTEOTOMY  6 weeks ago   Left foot: great, 2nd & 3rd   FOOT  OSTEOTOMY  5 years ago   Right great toe   HAND SURGERY Bilateral 2011-most recent   multiple hand surgeries, 2 on left, 3 on right   KNEE ARTHROPLASTY Right 04/28/2022   Procedure: COMPUTER ASSISTED TOTAL KNEE ARTHROPLASTY;  Surgeon: Donato Heinz, MD;  Location: [facility];  Service: Orthopedics;  Laterality: Right;   LOOP RECORDER INSERTION N/A 05/16/2020   Procedure: LOOP RECORDER INSERTION;  Surgeon: Marcina Millard, MD;  Location: [facility];  Service: Cardiovascular;  Laterality: N/A;   NASAL SINUS SURGERY  most recent 7-8 yrs ago   7 sinus surgeries    TRIGGER FINGER RELEASE  11/19/2011   Procedure: RELEASE TRIGGER FINGER/A-1 PULLEY;  Surgeon: Nicki Reaper, MD;  Location: [facility];  Service: Orthopedics;  Laterality: Right;  release a-1 pulley right index finger and cyst removal   WRIST GANGLION EXCISION  1980's   right   Patient Active Problem List   Diagnosis Date Noted   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 11/27/2022   Hypomagnesemia 11/27/2022   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/23/2022   HLD (hyperlipidemia) 11/23/2022   Asthma 11/23/2022   Depression with anxiety 11/23/2022   Chronic diastolic CHF (congestive heart failure) (HCC) 11/23/2022   Normocytic anemia 11/23/2022   Aspiration pneumonia (HCC) 11/23/2022   AKI (acute kidney injury) (HCC) 11/23/2022   Abdominal pain 11/23/2022   Mesenteric mass 11/23/2022   Coffee ground emesis 11/23/2022   Nausea and vomiting 10/15/2022   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   Fatigue 09/17/2022   Left hemiparesis (HCC) 09/17/2022   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HCC) 08/22/2022   GERD (gastroesophageal reflux disease) 08/21/2022   Agitation 08/20/2022   Acute left-sided weakness 08/20/2022   Expressive aphasia    Stroke (HCC) 08/19/2022   Leukocytosis 08/19/2022   History of urticaria 04/28/2022   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   Primary osteoarthritis of left knee  02/24/2022   Primary osteoarthritis of right knee 02/24/2022   Lumbar spondylolysis 04/12/2020   History of CVA (cerebrovascular accident) 03/26/2020   Low back pain radiating to right lower extremity 03/21/2020   B12 deficiency 03/06/2020   Positive anti-CCP test 12/21/2019   Arthralgia 12/13/2019   Dermatitis 12/13/2019   Rheumatoid factor positive 12/13/2019   Essential hypertension 12/11/2018   Palpitations 12/11/2018   Acquired hypothyroidism 11/10/2018   Arthritis of knee 09/17/2016   Anxiety 11/22/2014   Asthma without status asthmaticus 11/22/2014   Benign neoplasm of colon, unspecified 11/22/2014   Environmental allergies 11/22/2014   Hypertriglyceridemia 11/22/2014   Hypokalemia 11/22/2014   Personal history of disease of skin and subcutaneous tissue 11/22/2014    ONSET DATE: 11/23/22  REFERRING DIAG: TIA  THERAPY DIAG:  Muscle weakness (generalized)  Difficulty in walking, not elsewhere classified  Unsteadiness on feet  Rationale for Evaluation and Treatment: Rehabilitation  SUBJECTIVE:                                                                                                                                                                                             SUBJECTIVE STATEMENT: Patient reports she is doing fairly well - states she was able to transfer on her own last night.   Pt accompanied by: significant other  PERTINENT HISTORY: Patient is an 82 year-old female who was admitted to [facility] on 11/23/2022 with a cerebellar CVA. 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 Pt. Was admitted to Inpatient Rehab from 12/21-1/02/2023. 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  Patient underwent inpatient rehabilitation for 2 weeks.  Patient was assessed and was scheduled for a knee replacement on 08/29/2022 however had to cancel it due to having had  a CVA.   PMH includes:  anemia, arthritis, back pain, depression, hernia, GERD, HLD, HTN, hypothyroidism, knee pain, pancreatic hormone dysfunction, pneumonia, seizures, stroke, thyroid disease.  Patient has a donjoy brace for L knee. Walking with hand hold help with husband.   PAIN:  Are you having pain? Yes: NPRS scale: 0/10 Pain location: L knee Pain description: aching, gives out Aggravating factors: climbing into pain, moving it, walking around house Relieving factors: rest   PRECAUTIONS: Fall, has latex allergy  WEIGHT BEARING RESTRICTIONS: No  FALLS: Has patient fallen in last 6 months? Yes. Number of falls 4 falls   LIVING ENVIRONMENT: Lives with: lives with their family and lives with their spouse Lives in: House/apartment Stairs: Yes: Internal: flight steps;   and External: 2 steps; on right going up and on left going up Has following equipment at home: Single point cane, Walker - 2 wheeled, Shower bench, and bed side commode  PLOF: Independent  PATIENT GOALS: to have less pain and move better  OBJECTIVE:   DIAGNOSTIC FINDINGS:  Disc levels:   C2-3: No significant stenosis.   C3-4: A rightward disc osteophyte complex effaces the ventral CSF. Severe left and moderate right foraminal stenosis is present.   C4-5: A broad-based disc osteophyte complex effaces the ventral CSF. The canal is narrowed 7 mm. Severe left and moderate right foraminal stenosis is present.   C5-6: Chronic loss of disc height is present. Broad-based disc osteophyte complex is asymmetric to the right. Partial effacement of the ventral CSF is present. Moderate foraminal stenosis is worse right than left.   C6-7: A broad-based disc osteophyte complex is present. This partially effaces the ventral CSF. Moderate foraminal narrowing is worse right than left.   C7-T1: Uncovertebral spurring is present bilaterally. Mild bilateral foraminal stenosis is present.   IMPRESSION: 1. Multilevel spondylosis of the cervical  spine as described. 2. 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 3. 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 4. No other significant cord signal abnormality to suggest ischemic changes to the cord. 5. Severe left and moderate right foraminal stenosis at C3-4 and C4-5. 6. Moderate foraminal stenosis bilaterally at C5-6 and C6-7 is worse on the right. 7. Mild bilateral foraminal narrowing at C7-T1. 8. Prominent soft tissue pannus at C1-2 effaces the ventral CSF. 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  IMPRESSION: 1. 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 2. 9 mm right occipital pole infarct. 3. Punctate white matter infarct in the left occipital lobe. 4. Additional punctate cortical infarct in the medial right occipital lobe more superiorly. 5. Expected evolution of previous right posterior frontal lobe white matter infarct. 6. Stable atrophy and white matter disease likely reflects the sequela of chronic microvascular ischemia.  COGNITION: Overall cognitive status:  two strokes   SENSATION: WFL  COORDINATION: Heel slide test limited by LLE pain  POSTURE: rounded shoulders, forward head, and flexed trunk   LOWER EXTREMITY ROM:     Bowing of L knee  LOWER EXTREMITY MMT:    MMT Right Eval Left Eval  Hip flexion 3 3*  Hip extension    Hip abduction 3 3*  Hip adduction 3 *  Hip internal rotation    Hip external rotation    Knee flexion 3 3*  Knee extension 3 3*  Ankle dorsiflexion 4 4  Ankle plantarflexion 4 4  (Blank rows =  not tested) * pain  BED MOBILITY:  Per patient she has difficulty, occasionally needs help  TRANSFERS: Assistive device utilized: None  Sit to stand: CGA and Min A Stand to sit: Min A Chair to chair: CGA    GAIT: Gait pattern: step to pattern,  decreased stance time- Left, genu recurvatum- Left, and shuffling Distance walked: 10 ft Assistive device utilized: None Level of assistance: CGA Comments: Patient is very painful with ambulation, unable to ambulate longer duration   FUNCTIONAL TESTS:  5 times sit to stand: unable to tolerate 10 meter walk test: unable to tolerate  PATIENT SURVEYS:  FOTO 47  TODAY'S TREATMENT:                                                                                                                              DATE: 05/26/23   TherEx: Min A transferring from transport WC to mat table for stability  Supine: on large mat table  SAQ 2.5# AW donned 2 sets of 10 reps ea LE  Heel slides with 2.5 # AW 2 sets of 10 reps  Bridges 2 x 10 reps.  SLR 2x 10  LLE only Hip ABD/ADD with 2.5# AW donned with LE on slide board to reduce coefficient of friction   -Pt requires tactile and verbal cues throughout exercises to ensure attention to task and correct performance of interventions.   Sitting:   LAQ 2.5# 2 sets x 10 reps, first set no resistance  hamstring curl with NON LATEX YTB 2x12 reps        PATIENT EDUCATION: Education details: Pt educated throughout session about proper posture and technique with exercises. Improved exercise technique, movement at target joints, use of target muscles after min to mod verbal, visual, tactile cues.  Person educated: Patient and Spouse Education method: Explanation, Demonstration, Tactile cues, and Verbal cues Education comprehension: verbalized understanding, returned demonstration, verbal cues required, and tactile cues required  HOME EXERCISE PROGRAM: continue HEP as previously given Access Code: [email or web address] URL: [email or web address] Date: 04/14/2023 Prepared by: Precious Bard  Exercises - Seated Single Leg Hip Abduction  - 1 x daily - 7 x weekly - 2 sets - 10 reps - 5 hold - Seated Heel Slide  - 1 x daily - 7 x weekly - 2 sets - 10  reps - 5 hold - Seated Heel Toe Raises  - 1 x daily - 7 x weekly - 2 sets - 10 reps - 5 hold  GOALS: Goals reviewed with patient? Yes  SHORT TERM GOALS: Target date: 05/12/2023    Patient will be independent in home exercise program to improve strength/mobility for better functional independence with ADLs. Baseline: 5/7:  Goal status: INITIAL    LONG TERM GOALS: Target date: 07/07/2023    Patient will increase FOTO score to equal to or greater than   57%  to demonstrate statistically significant improvement in mobility and quality of life.  Baseline:  47% Goal  status: INITIAL  2.  Patient (> 44 years old) will complete five times sit to stand test in < 15 seconds indicating an increased LE strength and improved balance. Baseline: 5/7: unable to tolerate test Goal status: INITIAL  3.   Patient will deny any falls over past 4 weeks to demonstrate improved safety awareness at home and work.  Baseline: 5/7: multiple falls Goal status: INITIAL  4.  Patient will increase BLE gross strength to 4+/5 as to improve functional strength for independent gait, increased standing tolerance and increased ADL ability Baseline: 5/7: see above  Goal status: INITIAL  5.  Patient will be able to perform household work/ chores without increase in symptoms. Baseline: 5/7: unable to perform  Goal status: INITIAL    ASSESSMENT:  CLINICAL IMPRESSION:   Patient arrived with good motivation today and participated well with supine and sitting therex. Pt was a tad impulsive transfers and requires min A to prevent LOB with transfers. She reported no knee pain or issues with knee brace during session today and left knee brace donned throughout interventions this date.  She was able to follow all VC- some difficulty hearing but able to complete all therex well today. Patient will benefit from skilled physical therapy to reduce pain, improve mobility, improve strength and improve quality of life.     OBJECTIVE IMPAIRMENTS: Abnormal gait, cardiopulmonary status limiting activity, decreased activity tolerance, decreased balance, decreased cognition, decreased endurance, decreased knowledge of use of DME, decreased mobility, difficulty walking, decreased ROM, decreased strength, decreased safety awareness, hypomobility, increased fascial restrictions, impaired perceived functional ability, impaired flexibility, impaired UE functional use, improper body mechanics, postural dysfunction, and pain.   ACTIVITY LIMITATIONS: carrying, lifting, bending, sitting, standing, squatting, stairs, transfers, bed mobility, bathing, toileting, dressing, reach over head, hygiene/grooming, locomotion level, and caring for others  PARTICIPATION LIMITATIONS: meal prep, cleaning, laundry, medication management, personal finances, interpersonal relationship, driving, shopping, community activity, yard work, school, and church  PERSONAL FACTORS: Age, Behavior pattern, Education, Fitness, Past/current experiences, Time since onset of injury/illness/exacerbation, and 3+ comorbidities: anemia, arthritis, back pain, depression, hernia, GERD, HLD, HTN, hypothyroidism, knee pain, pancreatic hormone dysfunction, pneumonia, seizures, stroke, thyroid disease  are also affecting patient's functional outcome.   REHAB POTENTIAL: Fair    CLINICAL DECISION MAKING: Evolving/moderate complexity  EVALUATION COMPLEXITY: Moderate  PLAN:  PT FREQUENCY: 1-2x/week  PT DURATION: 12 weeks  PLANNED INTERVENTIONS: Therapeutic exercises, Therapeutic activity, Neuromuscular re-education, Balance training, Gait training, Patient/Family education, Self Care, Joint mobilization, Joint manipulation, Stair training, Vestibular training, Canalith repositioning, Visual/preceptual remediation/compensation, Orthotic/Fit training, DME instructions, Electrical stimulation, Wheelchair mobility training, Spinal mobilization, Cryotherapy, Moist heat,  Compression bandaging, scar mobilization, Splintting, Taping, Traction, Ultrasound, Manual therapy, and Re-evaluation  PLAN FOR NEXT SESSION: seated and supine strengthening of LLE    Norman Herrlich, PT 05/26/2023, 1:45 PM

## 2023-05-26 NOTE — Therapy (Addendum)
Occupational Therapy Progress Note  Dates of reporting period  04/09/2023   to   05/26/2023    Patient Name: [name] MRN: [id number] DOB:1941-01-17, 82 y.o., female Today's Date: 05/26/2023  PCP: Dr. Judithann Sheen REFERRING PROVIDER:  Dr. Judithann Sheen  END OF SESSION:  PLAN:  OT End of Session - 05/26/23 1412     Visit Number 40    Number of Visits 48    Date for OT Re-Evaluation 05/26/23    OT Start Time 1300    OT Stop Time 1345    OT Time Calculation (min) 45 min    Equipment Utilized During Treatment Wheelchair    Activity Tolerance Patient tolerated treatment well    Behavior During Therapy WFL for tasks assessed/performed               Past Medical History:  Diagnosis Date   Anemia    Arthritis    Asthma    uses inhaler just prior to surgery to avoid attack   Back pain    from previous injury   Complication of anesthesia    has woken  up during 2 different surgery   Depression    no current issue/treatment; situation   Gallstones    GERD (gastroesophageal reflux disease)    Hiatal hernia    patient does NOT have nerve/muscle disease   History of kidney stones    HLD (hyperlipidemia)    HTN (hypertension)    Hypothyroidism    Kidney stones    Knee pain    Nausea and vomiting 10/15/2022   Non-diabetic pancreatic hormone dysfunction years   pt. 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   Pancreatitis    Pneumonia    Seizures (HCC)    caused by dye injected during a procedure   Shortness of breath    with exertion   Sinus problem    frequent infections/congestion   Stroke (HCC) 2021   reports having CVA in 2021 and having mini strokes before that   Thyroid disease    Past Surgical History:  Procedure Laterality Date   ABDOMINAL HYSTERECTOMY     APPENDECTOMY     CARPAL TUNNEL RELEASE  10+ years ago   bilateral   EYE SURGERY  3 yrs ago   bilateral cataracts   FOOT OSTEOTOMY  6 weeks ago   Left foot: great, 2nd & 3rd   FOOT OSTEOTOMY  5 years ago    Right great toe   HAND SURGERY Bilateral 2011-most recent   multiple hand surgeries, 2 on left, 3 on right   KNEE ARTHROPLASTY Right 04/28/2022   Procedure: COMPUTER ASSISTED TOTAL KNEE ARTHROPLASTY;  Surgeon: Donato Heinz, MD;  Location: [facility];  Service: Orthopedics;  Laterality: Right;   LOOP RECORDER INSERTION N/A 05/16/2020   Procedure: LOOP RECORDER INSERTION;  Surgeon: Marcina Millard, MD;  Location: [facility];  Service: Cardiovascular;  Laterality: N/A;   NASAL SINUS SURGERY  most recent 7-8 yrs ago   7 sinus surgeries    TRIGGER FINGER RELEASE  11/19/2011   Procedure: RELEASE TRIGGER FINGER/A-1 PULLEY;  Surgeon: Nicki Reaper, MD;  Location: [facility];  Service: Orthopedics;  Laterality: Right;  release a-1 pulley right index finger and cyst removal   WRIST GANGLION EXCISION  1980's   right   Patient Active Problem List   Diagnosis Date Noted   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 11/27/2022   Hypomagnesemia 11/27/2022   Occlusion of right vertebral  artery 11/23/2022   HLD (hyperlipidemia) 11/23/2022   Asthma 11/23/2022   Depression with anxiety 11/23/2022   Chronic diastolic CHF (congestive heart failure) (HCC) 11/23/2022   Normocytic anemia 11/23/2022   Aspiration pneumonia (HCC) 11/23/2022   AKI (acute kidney injury) (HCC) 11/23/2022   Abdominal pain 11/23/2022   Mesenteric mass 11/23/2022   Coffee ground emesis 11/23/2022   Nausea and vomiting 10/15/2022   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   Fatigue 09/17/2022   Left hemiparesis (HCC) 09/17/2022   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HCC) 08/22/2022   GERD (gastroesophageal reflux disease) 08/21/2022   Agitation 08/20/2022   Acute left-sided weakness 08/20/2022   Expressive aphasia    Stroke (HCC) 08/19/2022   Leukocytosis 08/19/2022   History of urticaria 04/28/2022   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   Primary osteoarthritis of left knee 02/24/2022   Primary  osteoarthritis of right knee 02/24/2022   Lumbar spondylolysis 04/12/2020   History of CVA (cerebrovascular accident) 03/26/2020   Low back pain radiating to right lower extremity 03/21/2020   B12 deficiency 03/06/2020   Positive anti-CCP test 12/21/2019   Arthralgia 12/13/2019   Dermatitis 12/13/2019   Rheumatoid factor positive 12/13/2019   Essential hypertension 12/11/2018   Palpitations 12/11/2018   Acquired hypothyroidism 11/10/2018   Arthritis of knee 09/17/2016   Anxiety 11/22/2014   Asthma without status asthmaticus 11/22/2014   Benign neoplasm of colon, unspecified 11/22/2014   Environmental allergies 11/22/2014   Hypertriglyceridemia 11/22/2014   Hypokalemia 11/22/2014   Personal history of disease of skin and subcutaneous tissue 11/22/2014   REFERRING DIAG: CVA   THERAPY DIAG:  Muscle weakness (generalized)   Other lack of coordination   Rationale for Evaluation and Treatment Rehabilitation   SUBJECTIVE:   Pt. reports that she is still feeling discomfort in L shoulder. Pt. Reports that it is worse when she is moving it rather than sitting still.   SUBJECTIVE STATEMENT:   Pt accompanied by: significant other   PERTINENT HISTORY: Patient is an 82 year-old female who was admitted to [facility] on 11/23/2022 with a cerebellar CVA. 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 Pt. Was admitted to Inpatient Rehab from 12/21-1/02/2023. 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  Patient underwent inpatient rehabilitation for 2 weeks.  Patient was assessed and was scheduled for a knee replacement on 08/29/2022 however had to cancel it due to having had a CVA.  Patient had a recent fall 2 days after discharging from inpatient rehab. Past Medical History includes: Knee replacement, essential HTN, hypokalemia, leukocytosis, seizures, positive anti-- CCP test, anxiety disorder, mini strokes.  Patient  had shingles with left eye nerve pain s/p 1 year ago.    PRECAUTIONS: Fall   WEIGHT BEARING RESTRICTIONS No   PAIN:  Are you having pain? LUE pain when moving, rates pain as 5/10 and decreased to 3/10 towards end of session.    FALLS: Has patient fallen in last 6 months? Yes. Number of falls 1   LIVING ENVIRONMENT: Lives with: Lives with Spouse Lives in: House/apartment Stairs: 2 storey home, resides on the first floor.  External: 2 stairs front no rails, and 6 in back with rails Has following equipment at home: Single point cane, Walker - 2 wheeled, Environmental consultant - 4 wheeled, Shower bench, and bed side commode   PLOF: Independent   PATIENT GOALS  To Regain the use of her left arm   OBJECTIVE:    HAND DOMINANCE: Right   ADLs: Overall  ADLs: Husband assists pt. as needed Transfers/ambulation related to ADLs:Pt. Uses a 3 wheeled walker with Husband assist. Eating: Pt. Is independent with the right hand. Pt. has difficulty cutting food. Grooming: Pt. is using her right hand, however has difficulty sustaining her LUE in elevation to assist with haircare. UB Dressing: Pt. Is independent donning a pullover shirt, and button down shirt. Has difficulty with buttoning, LB Dressing:  Independent donning pants, and socks. Difficulty tying shoes. Toileting: Independent Bathing: Pt. Is able to engage her right hand. Tub Shower transfers: Supervision Equipment: See above for equipment     IADLs: Shopping:  Has not had the opportunity for grocery shopping yet Light housekeeping: Husband is assisting with light house keeping Meal Prep:  Dependent Community mobility: Relies of family/friends Medication management: Husband assisting with weekly pillbox set-up, and administering medication. Financial management: TBD Handwriting: 75% legible   MOBILITY STATUS: Hx of falls   POSTURE COMMENTS:  No Significant postural limitations Sitting balance: supported sitting balance WFL   ACTIVITY  TOLERANCE: Activity tolerance:  Fatigues in greater than 30 min.    FUNCTIONAL OUTCOME MEASURES: FOTO: 57   UPPER EXTREMITY ROM      Active ROM Right Eval: WFL Left eval Left  01/22/23 Left  03/03/23 Left  04/14/23  Shoulder flexion   132 100 108 108  Shoulder abduction   80 85 85 85  Shoulder adduction         Shoulder extension         Shoulder internal rotation         Shoulder external rotation         Elbow flexion   140 140 WFL WFL  Elbow extension   WNL WNL WFL WFL  Wrist flexion   65 68    Wrist extension   -10 20 24  32  Wrist ulnar deviation     12 10 14   Wrist radial deviation     8 14 14   Wrist pronation         Wrist supination         (Blank rows = not tested)   Left digit flexion to [facility]: 2nd: 0cm, 3rd: 0cm, 4th: 0cm, 5th: 0cm   Limited Left full 2nd digit extension     UPPER EXTREMITY MMT:      MMT Right Eval: 4+/5 overall Left Eval Left 01/22/23 Left 03/03/23 Left  04/14/2023  Shoulder flexion   3/5 3-/5 3-/5 3-/5  Shoulder abduction   3-/5 3-/5 3-/5 3-/5  Shoulder adduction         Shoulder extension         Shoulder internal rotation         Shoulder external rotation         Middle trapezius         Lower trapezius         Elbow flexion   3+/5 4/5 N/A 4/5  Elbow extension   3+/5 4/4 N/A 4/5  Wrist flexion         Wrist extension   2-/5 3-/5 3-/5 3/5  Wrist ulnar deviation         Wrist radial deviation         Wrist pronation         Wrist supination         (Blank rows = not tested)   HAND FUNCTION: Grip strength: Right: 26#, Left: 10# Pinch strength: Right 8#, Left: 3#, 3 Pt. Pinch strength: Right: 9#, L:  2#  01/22/2023 Grip strength: Right: 26#, Left: 12# Pinch strength:  Pinch meter used at the initial eval has been sent out for recalibration   03/03/2023 Grip strength: Right: 26#, Left: 13# Pinch strength:  Pinch meter used at the initial eval has been sent out for recalibration  04/14/2023: Grip strength: Right: 26#, Left:  13# Pinch strength:   Right 8#, Left: 4#, 3 Pt. Pinch strength: Right: 9#, L: 3#     COORDINATION: Right: 22 sec., Left: <5 min. To place 7 pegs with increased compensation proximally in the trunk, and through reflexive associated reactions.  01/22/23 Right: 22 sec., Left: 3 min. & 4 sec.  03/03/23 Right: 22 sec., Left: 1 min. & 39 sec.  04/14/23   TBD      SENSATION: Light touch: WFL, proprioceptive awareness: Intact   EDEMA: N/A   MUSCLE TONE: LUE: Hypotonic   COGNITION: Overall cognitive status: WFL for tasks assessed. Pt. Is impulsive at times.   VISION: Subjective report: Pt. report having shingles affecting left eye  s/p 1 year. Has nerve pain Baseline vision: Wears glasses for reading only Visual history:  updated see clinical impression   VISION ASSESSMENT:    WFL for tasks performed   PERCEPTION: Intact   PRAXIS: Impaired: Motor planning   OBSERVATIONS:  Pt. more alert, and engaging since prior to the most recent hospitalization.     TODAY'S TREATMENT:    Therapeutic Ex:   Pt. tolerated AROM/AAROM for shoulder flexion, elbow flexion/extension, forearm pronation/supination. Pt. worked on active supination followed by passive stretching to the end range of motion. Pt. Performed active supination with a hand held wooden dowel. Pt. worked on supination with resistance using the EZ board with multiple handles for gross gripping and 3 point pinch. Pt. Worked on bilateral upper extremity strengthening and reciprocal motion using the UBE while seated for 8 minutes with minimal resistance level during completion of the exercise. Constant monitoring was provided.       PATIENT EDUCATION: Education details: left hand digit extension Person educated: Patient and Spouse Education method: Medical illustrator Education comprehension: verbalized understanding, returned demonstration, and needs further education     HOME EXERCISE PROGRAM:    Reviewed  activities at home to promote isolated 2nd digit extension.    GOALS: Goals reviewed with patient? Yes   SHORT TERM GOALS: Target date: 01/27/2023       1. Patient will be independent with home exercise program for the left upper extremity Baseline: 04/09/2023: Pt. Continues to consistently attempt to engage her hand at hand 02/01/2023: Pt. Consistently attempts to perform HEPs independently. No current home exercise program Goal status: Ongoing     LONG TERM GOALS: Target date: 05/26/2023     Patient will improve left shoulder strength by 2 mm grades to be able to sustain UE's in elevation long enough to wash her hair.  Baseline: 05/26/2023: Pt. continues to present with limitiations in sustaining LUE elevation long enough to perform hair care thoroughly 04/09/2023: Pt. is limited with sustaining LUE elevation long enough to perform hair care thoroughly. 03/03/2023: Left shoulder flexion: 3-/5, abduction: 3-/5 01/22/2023: Left shoulder flexion: 3-/5, abduction: 3-/5 Eval: Left shoulder flexion: 3/5, abduction: 3-/5 Goal status: Ongoing   2.  Patient will improve left shoulder active abduction to be able to comb her hair Baseline: 05/26/2023:  5/10 left shoulder pain with ROM limits using it functionally during hair care. 04/09/2023: Pt. Presents with difficulty abducting her left shoulder enough to thoroughly complete  haircare. 03/03/2023: Shoulder abduction: 85 01/22/2023: Shoulder abduction: 85 Eval: Left shoulder abduction is 80(108) Goal status: Ongoing   3.  Patient will independently button her shirt with modified independence. Baseline: 05/26/2023:  Pt. Continues to progress towards buttoning. 04/09/2023: Pt. Continues to progress towards buttoning. 03/03/2023: Pt. Continues to have difficulty with buttoning. 01/22/2023: Pt. continues to have difficulty. Eval: Patient has difficulty.  Goal status: Ongoing   4.  Patient well improve left grip strength in preparation for securely holding  flowers. Baseline: 05/26/2023: TBD 04/09/2023: Pt. Is able to hold, and hike pants with the left hand, continues to have difficulty with securely holding flowers.   03/03/2023: left grip strength: 13# 01/22/2023: Left: 12# Eval: Pt. Is unable to securely hold flowers. Goal status: Ongoing   5.  Pt. will independently recall adaptive  strategies for performing ADL tasks including: flossing teeth, donning bra, applying makeup. Baseline: 05/26/2023: Continue 04/09/2023: Continue3/26/2024: Continue 01/22/2023: Pt. continues to benefit from education about adaptive strategies during ADLs, and IADLs. Eval: Pt. to be provided with adaptive strategies. Goal status: Ongoing   6.  Pt. will improve FOTO score by 2 points to reflect patient perceived performance improvement assessment specific ADLs  and IADLs Baseline: 1/61/0960: TBD 5/02/204:  TBD 03/03/23: FOTO 62 Eval: 57 Goal status: Ongoing  7.  Pt. will improve left hand coordination skills in order to be able to handle, and sort utensils in a drawer.     Baseline: 05/26/2023 Pt. Continues to present with difficulty handling and sorting utensils. 04/14/2023: 56 04/09/2023: TBD 03/03/23: 1 min. & 39 01/22/2022: Left: 3 min. & 4 sec. Eval: Pt. has difficulty sorting, and placing utensils with the left hand. Left FMC : >5 min. For 7 pegs on the 9 hole peg test.    Goal Status:  Ongoing  8. Pt. will improve active left 2nd digit extension to be able able to isolate her 2nd digit in preparation for pressing/pushing buttons on appliances, phones, or remotes. Baseline: 05/26/2023: Improving with left 2nd digit extension. 04/09/2023: Pt. Is progressing with isolating left 2nd digit extension, however continues to present with limited increased flexor tone. 03/03/23: Pt. Continues to work on improving consistency with 2nd digit extension to press the remote. 01/22/2023: is able to perform full digit extension, although 2nd digit is slow to extend s 2/2 flexor tone. Pt.  Eval: Pt.  is able to is unable to actively perform full digit extension Goal status:  Ongoing   ASSESSMENT:   CLINICAL IMPRESSION: Pt continues to report some pain in left shoulder area when performing shoulder abduction. Pt. tolerated AROM/AAROM for shoulder flexion, elbow extension/flexion, and forearm pronation/supination however when performing shoulder abduction shoulder pain increased and was discontinued. Pt. tolerated UBE for 8 minutes with minimal resistance level with no increase in pain. Pt. Was able to perform 5 reps of active supination with the wooden dowel. Pt. continues to present with increased compensation proximally in the trunk with leaning, and the LLE extending when performing supination during EZ board exercises. Pt. presented with decreased tightness with L forearm supination at the end of the session. Pt continues to work on improving L shoulder, elbow, and wrist strength, improving motor control and coordination in order to prepare the left upper extremity and hand for functional reaching and engaging the upper left extremity when performing hair care, buttoning, holding flowers, cutting food, and manipulating utensils to set the table as well as adaptive strategies during daily IADL care.     PERFORMANCE DEFICITS in  functional skills including ADLs, IADLs, coordination, proprioception, ROM, strength, FMC, and GMC, cognitive skills including memory, and psychosocial skills including coping strategies, environmental adaptation, interpersonal interactions, and routines and behaviors.    IMPAIRMENTS are limiting patient from ADLs, IADLs, education, leisure, and social participation.    COMORBIDITIES may have co-morbidities  that affects occupational performance. Patient will benefit from skilled OT to address above impairments and improve overall function.   MODIFICATION OR ASSISTANCE TO COMPLETE EVALUATION: Min-Moderate modification of tasks or assist with assess necessary to complete an  evaluation.   OT OCCUPATIONAL PROFILE AND HISTORY: Detailed assessment: Review of records and additional review of physical, cognitive, psychosocial history related to current functional performance.   CLINICAL DECISION MAKING: Moderate - several treatment options, min-mod task modification necessary   REHAB POTENTIAL: Good   EVALUATION COMPLEXITY: Moderate      PLAN: OT FREQUENCY: 2x/week   OT DURATION: 12 weeks   PLANNED INTERVENTIONS: self care/ADL training, therapeutic exercise, therapeutic activity, neuromuscular re-education, manual therapy, passive range of motion, functional mobility training, electrical stimulation, and paraffin   RECOMMENDED OTHER SERVICES: PT   CONSULTED AND AGREED WITH PLAN OF CARE: Patient and family member/caregiver   PLAN FOR NEXT SESSION: Initiate OT treatment   Herma Carson, Student-OT 05/26/2023, 2:33 PM  This entire session was performed under the direct supervision and direction of a licensed therapist. I have personally read, edited, and approve of the note as written.   Olegario Messier, MS, OTR/L  05/26/2023

## 2023-05-28 ENCOUNTER — Ambulatory Visit: Payer: Medicare HMO | Admitting: Occupational Therapy

## 2023-05-28 ENCOUNTER — Ambulatory Visit: Payer: Medicare HMO | Admitting: Physical Therapy

## 2023-05-28 DIAGNOSIS — R262 Difficulty in walking, not elsewhere classified: Secondary | ICD-10-CM | POA: Diagnosis not present

## 2023-05-28 DIAGNOSIS — R2681 Unsteadiness on feet: Secondary | ICD-10-CM

## 2023-05-28 DIAGNOSIS — M6281 Muscle weakness (generalized): Secondary | ICD-10-CM

## 2023-05-28 DIAGNOSIS — R278 Other lack of coordination: Secondary | ICD-10-CM

## 2023-05-28 DIAGNOSIS — R531 Weakness: Secondary | ICD-10-CM | POA: Diagnosis not present

## 2023-05-28 NOTE — Therapy (Signed)
Occupational Therapy Neuro treatment/Recertification Note   Patient Name: [name] MRN: [id number] DOB:10-Jun-1941, 82 y.o., female Today's Date: 05/28/2023  PCP: Dr. Judithann Sheen REFERRING PROVIDER:  Dr. Judithann Sheen  END OF SESSION:  PLAN:  OT End of Session - 05/28/23 1306     Visit Number 41    Number of Visits 48    Date for OT Re-Evaluation 08/20/23    Authorization Time Period    OT Start Time 1300    OT Stop Time 1345    OT Time Calculation (min) 45 min    Activity Tolerance Patient tolerated treatment well    Behavior During Therapy [facility] for tasks assessed/performed;Impulsive               Past Medical History:  Diagnosis Date   Anemia    Arthritis    Asthma    uses inhaler just prior to surgery to avoid attack   Back pain    from previous injury   Complication of anesthesia    has woken  up during 2 different surgery   Depression    no current issue/treatment; situation   Gallstones    GERD (gastroesophageal reflux disease)    Hiatal hernia    patient does NOT have nerve/muscle disease   History of kidney stones    HLD (hyperlipidemia)    HTN (hypertension)    Hypothyroidism    Kidney stones    Knee pain    Nausea and vomiting 10/15/2022   Non-diabetic pancreatic hormone dysfunction years   pt. 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   Pancreatitis    Pneumonia    Seizures (HCC)    caused by dye injected during a procedure   Shortness of breath    with exertion   Sinus problem    frequent infections/congestion   Stroke (HCC) 2021   reports having CVA in 2021 and having mini strokes before that   Thyroid disease    Past Surgical History:  Procedure Laterality Date   ABDOMINAL HYSTERECTOMY     APPENDECTOMY     CARPAL TUNNEL RELEASE  10+ years ago   bilateral   EYE SURGERY  3 yrs ago   bilateral cataracts   FOOT OSTEOTOMY  6 weeks ago   Left foot: great, 2nd & 3rd   FOOT OSTEOTOMY  5 years ago   Right great toe   HAND SURGERY Bilateral  2011-most recent   multiple hand surgeries, 2 on left, 3 on right   KNEE ARTHROPLASTY Right 04/28/2022   Procedure: COMPUTER ASSISTED TOTAL KNEE ARTHROPLASTY;  Surgeon: Donato Heinz, MD;  Location: [facility] ORS;  Service: Orthopedics;  Laterality: Right;   LOOP RECORDER INSERTION N/A 05/16/2020   Procedure: LOOP RECORDER INSERTION;  Surgeon: Marcina Millard, MD;  Location: [facility];  Service: Cardiovascular;  Laterality: N/A;   NASAL SINUS SURGERY  most recent 7-8 yrs ago   7 sinus surgeries    TRIGGER FINGER RELEASE  11/19/2011   Procedure: RELEASE TRIGGER FINGER/A-1 PULLEY;  Surgeon: Nicki Reaper, MD;  Location: [facility];  Service: Orthopedics;  Laterality: Right;  release a-1 pulley right index finger and cyst removal   WRIST GANGLION EXCISION  1980's   right   Patient Active Problem List   Diagnosis Date Noted   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 11/27/2022   Hypomagnesemia 11/27/2022   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   HLD (hyperlipidemia) 11/23/2022   Asthma 11/23/2022   Depression with  anxiety 11/23/2022   Chronic diastolic CHF (congestive heart failure) (HCC) 11/23/2022   Normocytic anemia 11/23/2022   Aspiration pneumonia (HCC) 11/23/2022   AKI (acute kidney injury) (HCC) 11/23/2022   Abdominal pain 11/23/2022   Mesenteric mass 11/23/2022   Coffee ground emesis 11/23/2022   Nausea and vomiting 10/15/2022   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   Fatigue 09/17/2022   Left hemiparesis (HCC) 09/17/2022   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HCC) 08/22/2022   GERD (gastroesophageal reflux disease) 08/21/2022   Agitation 08/20/2022   Acute left-sided weakness 08/20/2022   Expressive aphasia    Stroke (HCC) 08/19/2022   Leukocytosis 08/19/2022   History of urticaria 04/28/2022   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   Primary osteoarthritis of left knee 02/24/2022   Primary osteoarthritis of right knee 02/24/2022    Lumbar spondylolysis 04/12/2020   History of CVA (cerebrovascular accident) 03/26/2020   Low back pain radiating to right lower extremity 03/21/2020   B12 deficiency 03/06/2020   Positive anti-CCP test 12/21/2019   Arthralgia 12/13/2019   Dermatitis 12/13/2019   Rheumatoid factor positive 12/13/2019   Essential hypertension 12/11/2018   Palpitations 12/11/2018   Acquired hypothyroidism 11/10/2018   Arthritis of knee 09/17/2016   Anxiety 11/22/2014   Asthma without status asthmaticus 11/22/2014   Benign neoplasm of colon, unspecified 11/22/2014   Environmental allergies 11/22/2014   Hypertriglyceridemia 11/22/2014   Hypokalemia 11/22/2014   Personal history of disease of skin and subcutaneous tissue 11/22/2014   REFERRING DIAG: CVA   THERAPY DIAG:  Muscle weakness (generalized)   Other lack of coordination   Rationale for Evaluation and Treatment Rehabilitation   SUBJECTIVE:   Pt. reports that she is still feeling discomfort in L shoulder. Pt. Reports that it is worse when she is moving it rather than sitting still.   SUBJECTIVE STATEMENT:   Pt accompanied by: significant other   PERTINENT HISTORY: Patient is an 82 year-old female who was admitted to [facility] on 11/23/2022 with a cerebellar CVA. 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 Pt. Was admitted to Inpatient Rehab from 12/21-1/02/2023. 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  Patient underwent inpatient rehabilitation for 2 weeks.  Patient was assessed and was scheduled for a knee replacement on 08/29/2022 however had to cancel it due to having had a CVA.  Patient had a recent fall 2 days after discharging from inpatient rehab. Past Medical History includes: Knee replacement, essential HTN, hypokalemia, leukocytosis, seizures, positive anti-- CCP test, anxiety disorder, mini strokes.  Patient had shingles with left eye nerve pain s/p 1  year ago.    PRECAUTIONS: Fall   WEIGHT BEARING RESTRICTIONS No   PAIN:  Are you having pain? LUE pain when moving, rates pain as 5/10 and decreased to 3/10 towards end of session.    FALLS: Has patient fallen in last 6 months? Yes. Number of falls 1   LIVING ENVIRONMENT: Lives with: Lives with Spouse Lives in: House/apartment Stairs: 2 storey home, resides on the first floor.  External: 2 stairs front no rails, and 6 in back with rails Has following equipment at home: Single point cane, Walker - 2 wheeled, Environmental consultant - 4 wheeled, Shower bench, and bed side commode   PLOF: Independent   PATIENT GOALS  To Regain the use of her left arm   OBJECTIVE:    HAND DOMINANCE: Right   ADLs: Overall ADLs: Husband assists pt. as needed Transfers/ambulation related to ADLs:Pt. Uses a 3 wheeled walker  with Husband assist. Eating: Pt. Is independent with the right hand. Pt. has difficulty cutting food. Grooming: Pt. is using her right hand, however has difficulty sustaining her LUE in elevation to assist with haircare. UB Dressing: Pt. Is independent donning a pullover shirt, and button down shirt. Has difficulty with buttoning, LB Dressing:  Independent donning pants, and socks. Difficulty tying shoes. Toileting: Independent Bathing: Pt. Is able to engage her right hand. Tub Shower transfers: Supervision Equipment: See above for equipment     IADLs: Shopping:  Has not had the opportunity for grocery shopping yet Light housekeeping: Husband is assisting with light house keeping Meal Prep:  Dependent Community mobility: Relies of family/friends Medication management: Husband assisting with weekly pillbox set-up, and administering medication. Financial management: TBD Handwriting: 75% legible   MOBILITY STATUS: Hx of falls   POSTURE COMMENTS:  No Significant postural limitations Sitting balance: supported sitting balance WFL   ACTIVITY TOLERANCE: Activity tolerance:  Fatigues in  greater than 30 min.    FUNCTIONAL OUTCOME MEASURES: FOTO: 57  05/28/2023  FOTO:    UPPER EXTREMITY ROM      Active ROM Right Eval: [facility] Left eval Left  01/22/23 Left  03/03/23 Left  04/14/23 Left  05/28/2023  Shoulder flexion   132 100 108 108 108  Shoulder abduction   80 85 85 85 85  Shoulder adduction          Shoulder extension          Shoulder internal rotation          Shoulder external rotation          Elbow flexion   140 140 WFL WFL WFL  Elbow extension   WNL WNL [facility] WFL WFL  Wrist flexion   65 68     Wrist extension   -10 20 24  32 32  Wrist ulnar deviation     12 10 14 14   Wrist radial deviation     8 14 14 16   Wrist pronation          Wrist supination          (Blank rows = not tested)   Left digit flexion to [facility]: 2nd: 0cm, 3rd: 0cm, 4th: 0cm, 5th: 0cm   Limited Left full 2nd digit extension     UPPER EXTREMITY MMT:      MMT Right Eval: 4+/5 overall Left Eval Left 01/22/23 Left 03/03/23 Left  04/14/2023 Left 05/28/2023  Shoulder flexion   3/5 3-/5 3-/5 3-/5 3-/5  Shoulder abduction   3-/5 3-/5 3-/5 3-/5 3-/5  Shoulder adduction          Shoulder extension          Shoulder internal rotation          Shoulder external rotation          Middle trapezius          Lower trapezius          Elbow flexion   3+/5 4/5 N/A 4/5 4/5  Elbow extension   3+/5 4/4 N/A 4/5 4/5  Wrist flexion          Wrist extension   2-/5 3-/5 3-/5 3/5 3+/5  Wrist ulnar deviation          Wrist radial deviation          Wrist pronation          Wrist supination          (Blank rows =  not tested)   HAND FUNCTION: Grip strength: Right: 26#, Left: 10# Pinch strength: Right 8#, Left: 3#, 3 Pt. Pinch strength: Right: 9#, L: 2#  01/22/2023 Grip strength: Right: 26#, Left: 12# Pinch strength:  Pinch meter used at the initial eval has been sent out for recalibration   03/03/2023 Grip strength: Right: 26#, Left: 13# Pinch strength:  Pinch meter used at the initial eval has been sent  out for recalibration  04/14/2023: Grip strength: Right: 26#, Left: 13# Pinch strength:   Right 8#, Left: 4#, 3 Pt. Pinch strength: Right: 9#, L: 3#    05/28/2023: Grip strength: Right: 26#, Left: 13# Pinch strength:   Right 8#, Left: 2#, 3 Pt. Pinch strength: Right: 9#, L: 4#     COORDINATION: Right: 22 sec., Left: <5 min. To place 7 pegs with increased compensation proximally in the trunk, and through reflexive associated reactions.  01/22/23 Right: 22 sec., Left: 3 min. & 4 sec.  03/03/23 Right: 22 sec., Left: 1 min. & 39 sec.  04/14/23   TBD  05/28/23 Right: 22 sec., Left:  5 min.      SENSATION: Light touch: WFL, proprioceptive awareness: Intact   EDEMA: N/A   MUSCLE TONE: LUE: Hypotonic   COGNITION: Overall cognitive status: WFL for tasks assessed. Pt. Is impulsive at times.   VISION: Subjective report: Pt. report having shingles affecting left eye  s/p 1 year. Has nerve pain Baseline vision: Wears glasses for reading only Visual history:  updated see clinical impression   VISION ASSESSMENT:    WFL for tasks performed   PERCEPTION: Intact   PRAXIS: Impaired: Motor planning   OBSERVATIONS:  Pt. more alert, and engaging since prior to the most recent hospitalization.     TODAY'S TREATMENT:     Measurements were obtained, and goals were reviewed with the Pt.      PATIENT EDUCATION: Education details: left hand digit extension Person educated: Patient and Spouse Education method: Medical illustrator Education comprehension: verbalized understanding, returned demonstration, and needs further education     HOME EXERCISE PROGRAM:    Reviewed activities at home to promote isolated 2nd digit extension.    GOALS: Goals reviewed with patient? Yes   SHORT TERM GOALS: Target date:  07/09/2023         1. Patient will be independent with home exercise program for the left upper extremity Baseline: 05/28/2023: Pt. Requires assist 04/09/2023: Pt.  Continues to consistently attempt to engage her hand at hand 02/01/2023: Pt. Consistently attempts to perform HEPs independently. No current home exercise program Goal status: Ongoing     LONG TERM GOALS: Target date:  08/20/2023     1.  Patient will improve left shoulder strength by 2 mm grades to be able to sustain UE's in elevation long enough to wash her hair.  Baseline: 05/28/2023: Left shoulder flexion: 3-/5, abduction: 3-/5. Pt. Has an old left shoulder injury limiting progression with left shoulder strength 05/26/2023: Pt. continues to present with limitiations in sustaining LUE elevation long enough to perform hair care thoroughly 04/09/2023: Pt. is limited with sustaining LUE elevation long enough to perform hair care thoroughly. 03/03/2023: Left shoulder flexion: 3-/5, abduction: 3-/5 01/22/2023: Left shoulder flexion: 3-/5, abduction: 3-/5 Eval: Left shoulder flexion: 3/5, abduction: 3-/5 Goal status: Not met, Deferred   2.  Patient will improve left shoulder active abduction to be able to comb her hair Baseline: 05/28/2023: left shoulder flexion: 108 abduction: 85 Pt. Has an old left shoulder injury limiting progression  with left shoulder ROM 05/26/2023:  5/10 left shoulder pain with ROM limits using it functionally during hair care. 04/09/2023: Pt. Presents with difficulty abducting her left shoulder enough to thoroughly complete haircare. 03/03/2023: Shoulder abduction: 85 01/22/2023: Shoulder abduction: 85 Eval: Left shoulder abduction is 80(108) Goal status: Not met, Deferred   3.  Patient will independently button her shirt with modified independence. Baseline: 05/28/2023: pt. Continues to work towards progressing with buttoning 05/26/2023:  Pt. Continues to progress towards buttoning. 04/09/2023: Pt. Continues to progress towards buttoning. 03/03/2023: Pt. Continues to have difficulty with buttoning. 01/22/2023: Pt. continues to have difficulty. Eval: Patient has difficulty.  Goal status: Ongoing    4.  Patient well improve left grip strength in preparation for securely holding flowers. Baseline: 05/28/2023: Right: 26#, Left: 13#  pt. Presents with difficulty securely holding flowers in her left hand. 05/26/2023: TBD 04/09/2023: Pt. Is able to hold, and hike pants with the left hand, continues to have difficulty with securely holding flowers.   03/03/2023: left grip strength: 13# 01/22/2023: Left: 12# Eval: Pt. Is unable to securely hold flowers. Goal status: Ongoing   5.  Pt. will independently recall adaptive  strategies for performing ADL tasks including: flossing teeth, donning bra, applying makeup. Baseline: 05/28/2023: Pt. Needs continued education about adaptive strategies.05/26/2023: Continue 04/09/2023: Continue3/26/2024: Continue 01/22/2023: Pt. continues to benefit from education about adaptive strategies during ADLs, and IADLs. Eval: Pt. to be provided with adaptive strategies. Goal status: Ongoing   6.  Pt. will improve FOTO score by 2 points to reflect patient perceived performance improvement assessment specific ADLs  and IADLs Baseline: 08/05/5620: FOTO score 59 05/26/2023: TBD 5/02/204:  TBD 03/03/23: FOTO 62 Eval: 57 Goal status: Ongoing  7.  Pt. will improve left hand coordination skills in order to be able to handle, and sort utensils in a drawer.     Baseline: 05/28/2023: Left:  5 min. 05/26/2023 Pt. Continues to present with difficulty handling and sorting utensils. 04/14/2023: 56 04/09/2023: TBD 03/03/23: 1 min. & 39 01/22/2022: Left: 3 min. & 4 sec. Eval: Pt. has difficulty sorting, and placing utensils with the left hand. Left FMC : >5 min. For 7 pegs on the 9 hole peg test.    Goal Status:  Ongoing  8. Pt. will improve active left 2nd digit extension to be able able to isolate her 2nd digit in preparation for pressing/pushing buttons on appliances, phones, or remotes. Baseline: 05/28/2023: Pt. Continues to improve with left 2nd digit extension 05/26/2023: Improving with left 2nd digit  extension. 04/09/2023: Pt. Is progressing with isolating left 2nd digit extension, however continues to present with limited increased flexor tone. 03/03/23: Pt. Continues to work on improving consistency with 2nd digit extension to press the remote. 01/22/2023: is able to perform full digit extension, although 2nd digit is slow to extend s 2/2 flexor tone. Pt.  Eval: Pt. is able to is unable to actively perform full digit extension Goal status:  Ongoing   ASSESSMENT:   CLINICAL IMPRESSION:  Pt. Has had a stomach bug last week, and reports that she continues to feel weak today. This weakness has interfered with her overall progress this week. Pt. Has mead progress with left pinch strength. Pt. Presents with limited left grip strength, and FMC skills. Pt.'s left shoul.der ROM, and strength goals have been deferred for this recertification period as measurements have plateaued due to an old shoulder injury.  FOTO score is 59 Pt continues to work on improving L shoulder, elbow, and wrist  strength, improving motor control and coordination in order to prepare the left upper extremity and hand for functional reaching and engaging the upper left extremity when performing hair care, buttoning, holding flowers, cutting food, and manipulating utensils to set the table as well as adaptive strategies during daily IADL care.     PERFORMANCE DEFICITS in functional skills including ADLs, IADLs, coordination, proprioception, ROM, strength, FMC, and GMC, cognitive skills including memory, and psychosocial skills including coping strategies, environmental adaptation, interpersonal interactions, and routines and behaviors.    IMPAIRMENTS are limiting patient from ADLs, IADLs, education, leisure, and social participation.    COMORBIDITIES may have co-morbidities  that affects occupational performance. Patient will benefit from skilled OT to address above impairments and improve overall function.   MODIFICATION OR  ASSISTANCE TO COMPLETE EVALUATION: Min-Moderate modification of tasks or assist with assess necessary to complete an evaluation.   OT OCCUPATIONAL PROFILE AND HISTORY: Detailed assessment: Review of records and additional review of physical, cognitive, psychosocial history related to current functional performance.   CLINICAL DECISION MAKING: Moderate - several treatment options, min-mod task modification necessary   REHAB POTENTIAL: Good   EVALUATION COMPLEXITY: Moderate      PLAN: OT FREQUENCY: 2x/week   OT DURATION: 12 weeks   PLANNED INTERVENTIONS: self care/ADL training, therapeutic exercise, therapeutic activity, neuromuscular re-education, manual therapy, passive range of motion, functional mobility training, electrical stimulation, and paraffin   RECOMMENDED OTHER SERVICES: PT   CONSULTED AND AGREED WITH PLAN OF CARE: Patient and family member/caregiver   PLAN FOR NEXT SESSION: Initiate OT treatment   Olegario Messier, MS, OTR/L 05/28/2023

## 2023-05-28 NOTE — Therapy (Signed)
OUTPATIENT PHYSICAL THERAPY NEURO TREATMENT   Patient Name: LILYANNAH BULLINS MRN: [id number] DOB:July 29, 1941, 82 y.o., female Today's Date: 05/28/2023   PCP: Aram Beecham, D MD REFERRING PROVIDER: Jacquelynn Cree PA   END OF SESSION:  PT End of Session - 05/28/23 1305     Visit Number 7    Number of Visits 24    Date for PT Re-Evaluation 07/07/23    Authorization Type 3/10 eval 04/14/23    Progress Note Due on Visit 10    PT Start Time 1345    PT Stop Time 1427    PT Time Calculation (min) 42 min    Equipment Utilized During Treatment Gait belt    Activity Tolerance No increased pain;Patient tolerated treatment well    Behavior During Therapy [facility] for tasks assessed/performed;Impulsive                Past Medical History:  Diagnosis Date   Anemia    Arthritis    Asthma    uses inhaler just prior to surgery to avoid attack   Back pain    from previous injury   Complication of anesthesia    has woken  up during 2 different surgery   Depression    no current issue/treatment; situation   Gallstones    GERD (gastroesophageal reflux disease)    Hiatal hernia    patient does NOT have nerve/muscle disease   History of kidney stones    HLD (hyperlipidemia)    HTN (hypertension)    Hypothyroidism    Kidney stones    Knee pain    Nausea and vomiting 10/15/2022   Non-diabetic pancreatic hormone dysfunction years   pt. 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   Pancreatitis    Pneumonia    Seizures (HCC)    caused by dye injected during a procedure   Shortness of breath    with exertion   Sinus problem    frequent infections/congestion   Stroke (HCC) 2021   reports having CVA in 2021 and having mini strokes before that   Thyroid disease    Past Surgical History:  Procedure Laterality Date   ABDOMINAL HYSTERECTOMY     APPENDECTOMY     CARPAL TUNNEL RELEASE  10+ years ago   bilateral   EYE SURGERY  3 yrs ago   bilateral cataracts   FOOT OSTEOTOMY  6 weeks  ago   Left foot: great, 2nd & 3rd   FOOT OSTEOTOMY  5 years ago   Right great toe   HAND SURGERY Bilateral 2011-most recent   multiple hand surgeries, 2 on left, 3 on right   KNEE ARTHROPLASTY Right 04/28/2022   Procedure: COMPUTER ASSISTED TOTAL KNEE ARTHROPLASTY;  Surgeon: Donato Heinz, MD;  Location: [facility] OR[facility];  Service: Orthopedics;  Laterality: Right;   LOOP RECORDER INSERTION N/A 05/16/2020   Procedure: LOOP RECORDER INSERTION;  Surgeon: Marcina Millard, MD;  Location: [facility];  Service: Cardiovascular;  Laterality: N/A;   NASAL SINUS SURGERY  most recent 7-8 yrs ago   7 sinus surgeries    TRIGGER FINGER RELEASE  11/19/2011   Procedure: RELEASE TRIGGER FINGER/A-1 PULLEY;  Surgeon: Nicki Reaper, MD;  Location: [facility];  Service: Orthopedics;  Laterality: Right;  release a-1 pulley right index finger and cyst removal   WRIST GANGLION EXCISION  1980's   right   Patient Active Problem List   Diagnosis Date Noted   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  11/27/2022   Hypomagnesemia 11/27/2022   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   HLD (hyperlipidemia) 11/23/2022   Asthma 11/23/2022   Depression with anxiety 11/23/2022   Chronic diastolic CHF (congestive heart failure) (HCC) 11/23/2022   Normocytic anemia 11/23/2022   Aspiration pneumonia (HCC) 11/23/2022   AKI (acute kidney injury) (HCC) 11/23/2022   Abdominal pain 11/23/2022   Mesenteric mass 11/23/2022   Coffee ground emesis 11/23/2022   Nausea and vomiting 10/15/2022   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   Fatigue 09/17/2022   Left hemiparesis (HCC) 09/17/2022   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HCC) 08/22/2022   GERD (gastroesophageal reflux disease) 08/21/2022   Agitation 08/20/2022   Acute left-sided weakness 08/20/2022   Expressive aphasia    Stroke (HCC) 08/19/2022   Leukocytosis 08/19/2022   History of urticaria 04/28/2022   Total knee replacement status  04/28/2022   Primary osteoarthritis of left knee 02/24/2022   Primary osteoarthritis of right knee 02/24/2022   Lumbar spondylolysis 04/12/2020   History of CVA (cerebrovascular accident) 03/26/2020   Low back pain radiating to right lower extremity 03/21/2020   B12 deficiency 03/06/2020   Positive anti-CCP test 12/21/2019   Arthralgia 12/13/2019   Dermatitis 12/13/2019   Rheumatoid factor positive 12/13/2019   Essential hypertension 12/11/2018   Palpitations 12/11/2018   Acquired hypothyroidism 11/10/2018   Arthritis of knee 09/17/2016   Anxiety 11/22/2014   Asthma without status asthmaticus 11/22/2014   Benign neoplasm of colon, unspecified 11/22/2014   Environmental allergies 11/22/2014   Hypertriglyceridemia 11/22/2014   Hypokalemia 11/22/2014   Personal history of disease of skin and subcutaneous tissue 11/22/2014    ONSET DATE: 11/23/22  REFERRING DIAG: TIA  THERAPY DIAG:  Muscle weakness (generalized)  Difficulty in walking, not elsewhere classified  Unsteadiness on feet  Rationale for Evaluation and Treatment: Rehabilitation  SUBJECTIVE:                                                                                                                                                                                             SUBJECTIVE STATEMENT: Patient reports she is doing fairly well -  was feeling ill after last visit but is feeling better now.   Pt accompanied by: significant other  PERTINENT HISTORY: Patient is an 82 year-old female who was admitted to [facility] on 11/23/2022 with a cerebellar CVA. 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 Pt. Was admitted to Inpatient Rehab from 12/21-1/02/2023. 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  Patient underwent inpatient rehabilitation for 2 weeks.  Patient was assessed and was scheduled for a knee replacement on  08/29/2022 however  had to cancel it due to having had a CVA.   PMH includes: anemia, arthritis, back pain, depression, hernia, GERD, HLD, HTN, hypothyroidism, knee pain, pancreatic hormone dysfunction, pneumonia, seizures, stroke, thyroid disease.  Patient has a donjoy brace for L knee. Walking with hand hold help with husband.   PAIN:  Are you having pain? Yes: NPRS scale: 0/10 Pain location: L knee Pain description: aching, gives out Aggravating factors: climbing into pain, moving it, walking around house Relieving factors: rest   PRECAUTIONS: Fall, has latex allergy  WEIGHT BEARING RESTRICTIONS: No  FALLS: Has patient fallen in last 6 months? Yes. Number of falls 4 falls   LIVING ENVIRONMENT: Lives with: lives with their family and lives with their spouse Lives in: House/apartment Stairs: Yes: Internal: flight steps;   and External: 2 steps; on right going up and on left going up Has following equipment at home: Single point cane, Walker - 2 wheeled, Shower bench, and bed side commode  PLOF: Independent  PATIENT GOALS: to have less pain and move better  OBJECTIVE:   DIAGNOSTIC FINDINGS:  Disc levels:   C2-3: No significant stenosis.   C3-4: A rightward disc osteophyte complex effaces the ventral CSF. Severe left and moderate right foraminal stenosis is present.   C4-5: A broad-based disc osteophyte complex effaces the ventral CSF. The canal is narrowed 7 mm. Severe left and moderate right foraminal stenosis is present.   C5-6: Chronic loss of disc height is present. Broad-based disc osteophyte complex is asymmetric to the right. Partial effacement of the ventral CSF is present. Moderate foraminal stenosis is worse right than left.   C6-7: A broad-based disc osteophyte complex is present. This partially effaces the ventral CSF. Moderate foraminal narrowing is worse right than left.   C7-T1: Uncovertebral spurring is present bilaterally. Mild bilateral foraminal stenosis is present.    IMPRESSION: 1. Multilevel spondylosis of the cervical spine as described. 2. 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 3. 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 4. No other significant cord signal abnormality to suggest ischemic changes to the cord. 5. Severe left and moderate right foraminal stenosis at C3-4 and C4-5. 6. Moderate foraminal stenosis bilaterally at C5-6 and C6-7 is worse on the right. 7. Mild bilateral foraminal narrowing at C7-T1. 8. Prominent soft tissue pannus at C1-2 effaces the ventral CSF. 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  IMPRESSION: 1. 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 2. 9 mm right occipital pole infarct. 3. Punctate white matter infarct in the left occipital lobe. 4. Additional punctate cortical infarct in the medial right occipital lobe more superiorly. 5. Expected evolution of previous right posterior frontal lobe white matter infarct. 6. Stable atrophy and white matter disease likely reflects the sequela of chronic microvascular ischemia.  COGNITION: Overall cognitive status:  two strokes   SENSATION: WFL  COORDINATION: Heel slide test limited by LLE pain  POSTURE: rounded shoulders, forward head, and flexed trunk   LOWER EXTREMITY ROM:     Bowing of L knee  LOWER EXTREMITY MMT:    MMT Right Eval Left Eval  Hip flexion 3 3*  Hip extension    Hip abduction 3 3*  Hip adduction 3 *  Hip internal rotation    Hip external rotation    Knee flexion 3 3*  Knee extension 3 3*  Ankle dorsiflexion  4 4  Ankle plantarflexion 4 4  (Blank rows = not tested) * pain  BED MOBILITY:  Per patient she has difficulty, occasionally needs help  TRANSFERS: Assistive device utilized: None  Sit to stand: CGA and Min A Stand to sit: Min A Chair to  chair: CGA    GAIT: Gait pattern: step to pattern, decreased stance time- Left, genu recurvatum- Left, and shuffling Distance walked: 10 ft Assistive device utilized: None Level of assistance: CGA Comments: Patient is very painful with ambulation, unable to ambulate longer duration   FUNCTIONAL TESTS:  5 times sit to stand: unable to tolerate 10 meter walk test: unable to tolerate  PATIENT SURVEYS:  FOTO 47  TODAY'S TREATMENT:                                                                                                                              DATE: 05/28/23   TherEx: Min A transferring from transport WC to mat table for stability  Supine: on large mat table  SAQ 3# AW donned 2 sets of 10 reps ea LE  Supine march  with 3# AWwith LE on bolster x 10 ea LE with assistance from PT for heel clearance.  Heel slides with 3 # AW 2 sets of 10 reps  Bridges 2 x 10 reps.  SLR 2x 10  ea LE no resistance  Hip ABD/ADD with 3# AW donned with LE on slide board to reduce coefficient of friction x 10   -some pain noted on the R LE with this activity  -Pt requires tactile and verbal cues throughout exercises to ensure attention to task and correct performance of interventions.   Sitting:   LAQ 3# 2 sets x 10 reps, first set no resistance  hamstring curl with NON LATEX YTB (doubled) 2x12 reps        PATIENT EDUCATION: Education details: Pt educated throughout session about proper posture and technique with exercises. Improved exercise technique, movement at target joints, use of target muscles after min to mod verbal, visual, tactile cues.  Person educated: Patient and Spouse Education method: Explanation, Demonstration, Tactile cues, and Verbal cues Education comprehension: verbalized understanding, returned demonstration, verbal cues required, and tactile cues required  HOME EXERCISE PROGRAM: continue HEP as previously given Access Code: [email or web address] URL:  [email or web address] Date: 04/14/2023 Prepared by: Precious Bard  Exercises - Seated Single Leg Hip Abduction  - 1 x daily - 7 x weekly - 2 sets - 10 reps - 5 hold - Seated Heel Slide  - 1 x daily - 7 x weekly - 2 sets - 10 reps - 5 hold - Seated Heel Toe Raises  - 1 x daily - 7 x weekly - 2 sets - 10 reps - 5 hold  GOALS: Goals reviewed with patient? Yes  SHORT TERM GOALS: Target date: 05/12/2023    Patient will be independent in home exercise program to improve strength/mobility for better functional independence with ADLs.  Baseline: 5/7:  Goal status: INITIAL    LONG TERM GOALS: Target date: 07/07/2023    Patient will increase FOTO score to equal to or greater than   57%  to demonstrate statistically significant improvement in mobility and quality of life.  Baseline:  47% Goal status: INITIAL  2.  Patient (> 36 years old) will complete five times sit to stand test in < 15 seconds indicating an increased LE strength and improved balance. Baseline: 5/7: unable to tolerate test Goal status: INITIAL  3.   Patient will deny any falls over past 4 weeks to demonstrate improved safety awareness at home and work.  Baseline: 5/7: multiple falls Goal status: INITIAL  4.  Patient will increase BLE gross strength to 4+/5 as to improve functional strength for independent gait, increased standing tolerance and increased ADL ability Baseline: 5/7: see above  Goal status: INITIAL  5.  Patient will be able to perform household work/ chores without increase in symptoms. Baseline: 5/7: unable to perform  Goal status: INITIAL    ASSESSMENT:  CLINICAL IMPRESSION:   Patient arrived with good motivation today and participated well with supine and sitting therex.  She was able to follow all VC- some difficulty hearing but able to complete all therex well today. Pt does require assistance with attention to task.  Patient will benefit from skilled physical therapy to reduce  pain, improve mobility, improve strength and improve quality of life.    OBJECTIVE IMPAIRMENTS: Abnormal gait, cardiopulmonary status limiting activity, decreased activity tolerance, decreased balance, decreased cognition, decreased endurance, decreased knowledge of use of DME, decreased mobility, difficulty walking, decreased ROM, decreased strength, decreased safety awareness, hypomobility, increased fascial restrictions, impaired perceived functional ability, impaired flexibility, impaired UE functional use, improper body mechanics, postural dysfunction, and pain.   ACTIVITY LIMITATIONS: carrying, lifting, bending, sitting, standing, squatting, stairs, transfers, bed mobility, bathing, toileting, dressing, reach over head, hygiene/grooming, locomotion level, and caring for others  PARTICIPATION LIMITATIONS: meal prep, cleaning, laundry, medication management, personal finances, interpersonal relationship, driving, shopping, community activity, yard work, school, and church  PERSONAL FACTORS: Age, Behavior pattern, Education, Fitness, Past/current experiences, Time since onset of injury/illness/exacerbation, and 3+ comorbidities: anemia, arthritis, back pain, depression, hernia, GERD, HLD, HTN, hypothyroidism, knee pain, pancreatic hormone dysfunction, pneumonia, seizures, stroke, thyroid disease  are also affecting patient's functional outcome.   REHAB POTENTIAL: Fair    CLINICAL DECISION MAKING: Evolving/moderate complexity  EVALUATION COMPLEXITY: Moderate  PLAN:  PT FREQUENCY: 1-2x/week  PT DURATION: 12 weeks  PLANNED INTERVENTIONS: Therapeutic exercises, Therapeutic activity, Neuromuscular re-education, Balance training, Gait training, Patient/Family education, Self Care, Joint mobilization, Joint manipulation, Stair training, Vestibular training, Canalith repositioning, Visual/preceptual remediation/compensation, Orthotic/Fit training, DME instructions, Electrical stimulation, Wheelchair  mobility training, Spinal mobilization, Cryotherapy, Moist heat, Compression bandaging, scar mobilization, Splintting, Taping, Traction, Ultrasound, Manual therapy, and Re-evaluation  PLAN FOR NEXT SESSION: seated and supine strengthening of LLE    Norman Herrlich, PT 05/28/2023, 3:46 PM

## 2023-06-02 ENCOUNTER — Ambulatory Visit: Payer: Medicare HMO | Admitting: Occupational Therapy

## 2023-06-02 ENCOUNTER — Ambulatory Visit: Payer: Medicare HMO

## 2023-06-02 DIAGNOSIS — M6281 Muscle weakness (generalized): Secondary | ICD-10-CM

## 2023-06-02 DIAGNOSIS — R2681 Unsteadiness on feet: Secondary | ICD-10-CM | POA: Diagnosis not present

## 2023-06-02 DIAGNOSIS — R262 Difficulty in walking, not elsewhere classified: Secondary | ICD-10-CM | POA: Diagnosis not present

## 2023-06-02 DIAGNOSIS — R531 Weakness: Secondary | ICD-10-CM | POA: Diagnosis not present

## 2023-06-02 DIAGNOSIS — R278 Other lack of coordination: Secondary | ICD-10-CM | POA: Diagnosis not present

## 2023-06-02 NOTE — Therapy (Signed)
OUTPATIENT PHYSICAL THERAPY NEURO TREATMENT   Patient Name: [name] MRN: [id number] DOB:1941/09/02, 82 y.o., female Today's Date: 06/02/2023   PCP: Aram Beecham, D MD REFERRING PROVIDER: Jacquelynn Cree PA   END OF SESSION:  PT End of Session - 06/02/23 1515     Visit Number 8    Number of Visits 24    Date for PT Re-Evaluation 07/07/23    Authorization Type 3/10 eval 04/14/23    Progress Note Due on Visit 10    PT Start Time 1517    PT Stop Time 1600    PT Time Calculation (min) 43 min    Equipment Utilized During Treatment Gait belt    Activity Tolerance No increased pain;Patient tolerated treatment well    Behavior During Therapy [facility] for tasks assessed/performed;Impulsive                Past Medical History:  Diagnosis Date   Anemia    Arthritis    Asthma    uses inhaler just prior to surgery to avoid attack   Back pain    from previous injury   Complication of anesthesia    has woken  up during 2 different surgery   Depression    no current issue/treatment; situation   Gallstones    GERD (gastroesophageal reflux disease)    Hiatal hernia    patient does NOT have nerve/muscle disease   History of kidney stones    HLD (hyperlipidemia)    HTN (hypertension)    Hypothyroidism    Kidney stones    Knee pain    Nausea and vomiting 10/15/2022   Non-diabetic pancreatic hormone dysfunction years   pt. 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   Pancreatitis    Pneumonia    Seizures (HCC)    caused by dye injected during a procedure   Shortness of breath    with exertion   Sinus problem    frequent infections/congestion   Stroke (HCC) 2021   reports having CVA in 2021 and having mini strokes before that   Thyroid disease    Past Surgical History:  Procedure Laterality Date   ABDOMINAL HYSTERECTOMY     APPENDECTOMY     CARPAL TUNNEL RELEASE  10+ years ago   bilateral   EYE SURGERY  3 yrs ago   bilateral cataracts   FOOT OSTEOTOMY  6 weeks  ago   Left foot: great, 2nd & 3rd   FOOT OSTEOTOMY  5 years ago   Right great toe   HAND SURGERY Bilateral 2011-most recent   multiple hand surgeries, 2 on left, 3 on right   KNEE ARTHROPLASTY Right 04/28/2022   Procedure: COMPUTER ASSISTED TOTAL KNEE ARTHROPLASTY;  Surgeon: Donato Heinz, MD;  Location: [facility];  Service: Orthopedics;  Laterality: Right;   LOOP RECORDER INSERTION N/A 05/16/2020   Procedure: LOOP RECORDER INSERTION;  Surgeon: Marcina Millard, MD;  Location: [facility];  Service: Cardiovascular;  Laterality: N/A;   NASAL SINUS SURGERY  most recent 7-8 yrs ago   7 sinus surgeries    TRIGGER FINGER RELEASE  11/19/2011   Procedure: RELEASE TRIGGER FINGER/A-1 PULLEY;  Surgeon: Nicki Reaper, MD;  Location: [facility];  Service: Orthopedics;  Laterality: Right;  release a-1 pulley right index finger and cyst removal   WRIST GANGLION EXCISION  1980's   right   Patient Active Problem List   Diagnosis Date Noted   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  11/27/2022   Hypomagnesemia 11/27/2022   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   HLD (hyperlipidemia) 11/23/2022   Asthma 11/23/2022   Depression with anxiety 11/23/2022   Chronic diastolic CHF (congestive heart failure) (HCC) 11/23/2022   Normocytic anemia 11/23/2022   Aspiration pneumonia (HCC) 11/23/2022   AKI (acute kidney injury) (HCC) 11/23/2022   Abdominal pain 11/23/2022   Mesenteric mass 11/23/2022   Coffee ground emesis 11/23/2022   Nausea and vomiting 10/15/2022   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   Fatigue 09/17/2022   Left hemiparesis (HCC) 09/17/2022   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HCC) 08/22/2022   GERD (gastroesophageal reflux disease) 08/21/2022   Agitation 08/20/2022   Acute left-sided weakness 08/20/2022   Expressive aphasia    Stroke (HCC) 08/19/2022   Leukocytosis 08/19/2022   History of urticaria 04/28/2022   Total knee replacement status  04/28/2022   Primary osteoarthritis of left knee 02/24/2022   Primary osteoarthritis of right knee 02/24/2022   Lumbar spondylolysis 04/12/2020   History of CVA (cerebrovascular accident) 03/26/2020   Low back pain radiating to right lower extremity 03/21/2020   B12 deficiency 03/06/2020   Positive anti-CCP test 12/21/2019   Arthralgia 12/13/2019   Dermatitis 12/13/2019   Rheumatoid factor positive 12/13/2019   Essential hypertension 12/11/2018   Palpitations 12/11/2018   Acquired hypothyroidism 11/10/2018   Arthritis of knee 09/17/2016   Anxiety 11/22/2014   Asthma without status asthmaticus 11/22/2014   Benign neoplasm of colon, unspecified 11/22/2014   Environmental allergies 11/22/2014   Hypertriglyceridemia 11/22/2014   Hypokalemia 11/22/2014   Personal history of disease of skin and subcutaneous tissue 11/22/2014    ONSET DATE: 11/23/22  REFERRING DIAG: TIA  THERAPY DIAG:  Muscle weakness (generalized)  Difficulty in walking, not elsewhere classified  Unsteadiness on feet  Rationale for Evaluation and Treatment: Rehabilitation  SUBJECTIVE:                                                                                                                                                                                             SUBJECTIVE STATEMENT: Pt reports no updates, no stumbles/falls, no medication changes and no pain. She is sleepy currently.  Pt accompanied by: significant other  PERTINENT HISTORY: Patient is an 82 year-old female who was admitted to [facility] on 11/23/2022 with a cerebellar CVA. 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 Pt. Was admitted to Inpatient Rehab from 12/21-1/02/2023. 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  Patient underwent inpatient rehabilitation for 2 weeks.  Patient was assessed and was scheduled for a knee replacement on 08/29/2022 however had  to  cancel it due to having had a CVA.   PMH includes: anemia, arthritis, back pain, depression, hernia, GERD, HLD, HTN, hypothyroidism, knee pain, pancreatic hormone dysfunction, pneumonia, seizures, stroke, thyroid disease.  Patient has a donjoy brace for L knee. Walking with hand hold help with husband.   PAIN:  Are you having pain? Yes: NPRS scale: 0/10 Pain location: L knee Pain description: aching, gives out Aggravating factors: climbing into pain, moving it, walking around house Relieving factors: rest   PRECAUTIONS: Fall, has latex allergy  WEIGHT BEARING RESTRICTIONS: No  FALLS: Has patient fallen in last 6 months? Yes. Number of falls 4 falls   LIVING ENVIRONMENT: Lives with: lives with their family and lives with their spouse Lives in: House/apartment Stairs: Yes: Internal: flight steps;   and External: 2 steps; on right going up and on left going up Has following equipment at home: Single point cane, Walker - 2 wheeled, Shower bench, and bed side commode  PLOF: Independent  PATIENT GOALS: to have less pain and move better  OBJECTIVE:   DIAGNOSTIC FINDINGS:  Disc levels:   C2-3: No significant stenosis.   C3-4: A rightward disc osteophyte complex effaces the ventral CSF. Severe left and moderate right foraminal stenosis is present.   C4-5: A broad-based disc osteophyte complex effaces the ventral CSF. The canal is narrowed 7 mm. Severe left and moderate right foraminal stenosis is present.   C5-6: Chronic loss of disc height is present. Broad-based disc osteophyte complex is asymmetric to the right. Partial effacement of the ventral CSF is present. Moderate foraminal stenosis is worse right than left.   C6-7: A broad-based disc osteophyte complex is present. This partially effaces the ventral CSF. Moderate foraminal narrowing is worse right than left.   C7-T1: Uncovertebral spurring is present bilaterally. Mild bilateral foraminal stenosis is present.    IMPRESSION: 1. Multilevel spondylosis of the cervical spine as described. 2. 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 3. 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 4. No other significant cord signal abnormality to suggest ischemic changes to the cord. 5. Severe left and moderate right foraminal stenosis at C3-4 and C4-5. 6. Moderate foraminal stenosis bilaterally at C5-6 and C6-7 is worse on the right. 7. Mild bilateral foraminal narrowing at C7-T1. 8. Prominent soft tissue pannus at C1-2 effaces the ventral CSF. 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  IMPRESSION: 1. 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 2. 9 mm right occipital pole infarct. 3. Punctate white matter infarct in the left occipital lobe. 4. Additional punctate cortical infarct in the medial right occipital lobe more superiorly. 5. Expected evolution of previous right posterior frontal lobe white matter infarct. 6. Stable atrophy and white matter disease likely reflects the sequela of chronic microvascular ischemia.  COGNITION: Overall cognitive status:  two strokes   SENSATION: WFL  COORDINATION: Heel slide test limited by LLE pain  POSTURE: rounded shoulders, forward head, and flexed trunk   LOWER EXTREMITY ROM:     Bowing of L knee  LOWER EXTREMITY MMT:    MMT Right Eval Left Eval  Hip flexion 3 3*  Hip extension    Hip abduction 3 3*  Hip adduction 3 *  Hip internal rotation    Hip external rotation    Knee flexion 3 3*  Knee extension 3 3*  Ankle dorsiflexion 4 4  Ankle plantarflexion  4 4  (Blank rows = not tested) * pain  BED MOBILITY:  Per patient she has difficulty, occasionally needs help  TRANSFERS: Assistive device utilized: None  Sit to stand: CGA and Min A Stand to sit: Min A Chair to  chair: CGA    GAIT: Gait pattern: step to pattern, decreased stance time- Left, genu recurvatum- Left, and shuffling Distance walked: 10 ft Assistive device utilized: None Level of assistance: CGA Comments: Patient is very painful with ambulation, unable to ambulate longer duration   FUNCTIONAL TESTS:  5 times sit to stand: unable to tolerate 10 meter walk test: unable to tolerate  PATIENT SURVEYS:  FOTO 47  TODAY'S TREATMENT:                                                                                                                              DATE: 06/02/23   TherEx: Min A transferring from transport WC to mat table for stability at start of session and back at end of session 1x for each  Supine: on large mat table  Supine march 10x each LE with addition RUE reach for coordination challenge   Pt very sleepy/closing eyes frequently in session - vitals assessed: 129/68 mmHg HR 73 SPO2%  92-94%  SAQ 3# AW donned 2 sets of 12 reps ea LE   Supine march  with 3# AW with LE on bolster x 12 ea LE. Terminated after pt reported R side hip pain.  Bridges 1 x12, 1x10 reps. Pt reports intervention feels good  Heel slides with 3 # AW 2 sets of 12 reps   SLR 2x 12-14 LLE. Attempted on RLE but too pain limited currently and terminated.     Sitting:   LAQ 3# 2 sets x 12 reps. Rates easy  LTL seated step outs with 3# AW 10x each LE   Pt reports hip feels good at end of session    PATIENT EDUCATION: Education details: Pt educated throughout session about proper posture and technique with exercises. Improved exercise technique, movement at target joints, use of target muscles after min to mod verbal, visual, tactile cues.  Person educated: Patient and Spouse Education method: Explanation, Demonstration, Tactile cues, and Verbal cues Education comprehension: verbalized understanding, returned demonstration, verbal cues required, and tactile cues required  HOME EXERCISE  PROGRAM: continue HEP as previously given Access Code: [email or web address] URL: [email or web address] Date: 04/14/2023 Prepared by: Precious Bard  Exercises - Seated Single Leg Hip Abduction  - 1 x daily - 7 x weekly - 2 sets - 10 reps - 5 hold - Seated Heel Slide  - 1 x daily - 7 x weekly - 2 sets - 10 reps - 5 hold - Seated Heel Toe Raises  - 1 x daily - 7 x weekly - 2 sets - 10 reps - 5 hold  GOALS: Goals reviewed with patient? Yes  SHORT TERM GOALS: Target date: 05/12/2023    Patient will be independent  in home exercise program to improve strength/mobility for better functional independence with ADLs. Baseline: 5/7:  Goal status: INITIAL    LONG TERM GOALS: Target date: 07/07/2023    Patient will increase FOTO score to equal to or greater than   57%  to demonstrate statistically significant improvement in mobility and quality of life.  Baseline:  47% Goal status: INITIAL  2.  Patient (> 47 years old) will complete five times sit to stand test in < 15 seconds indicating an increased LE strength and improved balance. Baseline: 5/7: unable to tolerate test Goal status: INITIAL  3.   Patient will deny any falls over past 4 weeks to demonstrate improved safety awareness at home and work.  Baseline: 5/7: multiple falls Goal status: INITIAL  4.  Patient will increase BLE gross strength to 4+/5 as to improve functional strength for independent gait, increased standing tolerance and increased ADL ability Baseline: 5/7: see above  Goal status: INITIAL  5.  Patient will be able to perform household work/ chores without increase in symptoms. Baseline: 5/7: unable to perform  Goal status: INITIAL    ASSESSMENT:  CLINICAL IMPRESSION:  Pt very sleepy at start of session and was falling asleep/closing her eyes. Vitals were assessed and were WNL. As pt continued to perform therex she became more alert/less sleepy with improved attention to task. Pt reported that this is normal  for her now and physician aware.  Patient will benefit from skilled physical therapy to reduce pain, improve mobility, improve strength and improve quality of life.    OBJECTIVE IMPAIRMENTS: Abnormal gait, cardiopulmonary status limiting activity, decreased activity tolerance, decreased balance, decreased cognition, decreased endurance, decreased knowledge of use of DME, decreased mobility, difficulty walking, decreased ROM, decreased strength, decreased safety awareness, hypomobility, increased fascial restrictions, impaired perceived functional ability, impaired flexibility, impaired UE functional use, improper body mechanics, postural dysfunction, and pain.   ACTIVITY LIMITATIONS: carrying, lifting, bending, sitting, standing, squatting, stairs, transfers, bed mobility, bathing, toileting, dressing, reach over head, hygiene/grooming, locomotion level, and caring for others  PARTICIPATION LIMITATIONS: meal prep, cleaning, laundry, medication management, personal finances, interpersonal relationship, driving, shopping, community activity, yard work, school, and church  PERSONAL FACTORS: Age, Behavior pattern, Education, Fitness, Past/current experiences, Time since onset of injury/illness/exacerbation, and 3+ comorbidities: anemia, arthritis, back pain, depression, hernia, GERD, HLD, HTN, hypothyroidism, knee pain, pancreatic hormone dysfunction, pneumonia, seizures, stroke, thyroid disease  are also affecting patient's functional outcome.   REHAB POTENTIAL: Fair    CLINICAL DECISION MAKING: Evolving/moderate complexity  EVALUATION COMPLEXITY: Moderate  PLAN:  PT FREQUENCY: 1-2x/week  PT DURATION: 12 weeks  PLANNED INTERVENTIONS: Therapeutic exercises, Therapeutic activity, Neuromuscular re-education, Balance training, Gait training, Patient/Family education, Self Care, Joint mobilization, Joint manipulation, Stair training, Vestibular training, Canalith repositioning, Visual/preceptual  remediation/compensation, Orthotic/Fit training, DME instructions, Electrical stimulation, Wheelchair mobility training, Spinal mobilization, Cryotherapy, Moist heat, Compression bandaging, scar mobilization, Splintting, Taping, Traction, Ultrasound, Manual therapy, and Re-evaluation  PLAN FOR NEXT SESSION: seated and supine strengthening of LLE    Baird Kay, PT 06/02/2023, 5:40 PM

## 2023-06-02 NOTE — Therapy (Addendum)
Occupational Therapy Neuro Treatment Note   Patient Name: [name] MRN: [id number] DOB:1941-04-23, 82 y.o., female Today's Date: 06/02/2023  PCP: Dr. Judithann Sheen REFERRING PROVIDER:  Dr. Judithann Sheen  END OF SESSION:  PLAN:   OT End of Session - 06/02/23 1553     Visit Number 42    Number of Visits 72    Date for OT Re-Evaluation 08/20/23    OT Start Time 1430    OT Stop Time 1515    OT Time Calculation (min) 45 min    Equipment Utilized During Treatment Wheelchair    Activity Tolerance Patient tolerated treatment well    Behavior During Therapy WFL for tasks assessed/performed                      Past Medical History:  Diagnosis Date   Anemia    Arthritis    Asthma    uses inhaler just prior to surgery to avoid attack   Back pain    from previous injury   Complication of anesthesia    has woken  up during 2 different surgery   Depression    no current issue/treatment; situation   Gallstones    GERD (gastroesophageal reflux disease)    Hiatal hernia    patient does NOT have nerve/muscle disease   History of kidney stones    HLD (hyperlipidemia)    HTN (hypertension)    Hypothyroidism    Kidney stones    Knee pain    Nausea and vomiting 10/15/2022   Non-diabetic pancreatic hormone dysfunction years   pt. 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   Pancreatitis    Pneumonia    Seizures (HCC)    caused by dye injected during a procedure   Shortness of breath    with exertion   Sinus problem    frequent infections/congestion   Stroke (HCC) 2021   reports having CVA in 2021 and having mini strokes before that   Thyroid disease    Past Surgical History:  Procedure Laterality Date   ABDOMINAL HYSTERECTOMY     APPENDECTOMY     CARPAL TUNNEL RELEASE  10+ years ago   bilateral   EYE SURGERY  3 yrs ago   bilateral cataracts   FOOT OSTEOTOMY  6 weeks ago   Left foot: great, 2nd & 3rd   FOOT OSTEOTOMY  5 years ago   Right great toe   HAND SURGERY  Bilateral 2011-most recent   multiple hand surgeries, 2 on left, 3 on right   KNEE ARTHROPLASTY Right 04/28/2022   Procedure: COMPUTER ASSISTED TOTAL KNEE ARTHROPLASTY;  Surgeon: Donato Heinz, MD;  Location: [facility];  Service: Orthopedics;  Laterality: Right;   LOOP RECORDER INSERTION N/A 05/16/2020   Procedure: LOOP RECORDER INSERTION;  Surgeon: Marcina Millard, MD;  Location: [facility];  Service: Cardiovascular;  Laterality: N/A;   NASAL SINUS SURGERY  most recent 7-8 yrs ago   7 sinus surgeries    TRIGGER FINGER RELEASE  11/19/2011   Procedure: RELEASE TRIGGER FINGER/A-1 PULLEY;  Surgeon: Nicki Reaper, MD;  Location: [facility];  Service: Orthopedics;  Laterality: Right;  release a-1 pulley right index finger and cyst removal   WRIST GANGLION EXCISION  1980's   right   Patient Active Problem List   Diagnosis Date Noted   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 11/27/2022   Hypomagnesemia 11/27/2022   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   HLD (  hyperlipidemia) 11/23/2022   Asthma 11/23/2022   Depression with anxiety 11/23/2022   Chronic diastolic CHF (congestive heart failure) (HCC) 11/23/2022   Normocytic anemia 11/23/2022   Aspiration pneumonia (HCC) 11/23/2022   AKI (acute kidney injury) (HCC) 11/23/2022   Abdominal pain 11/23/2022   Mesenteric mass 11/23/2022   Coffee ground emesis 11/23/2022   Nausea and vomiting 10/15/2022   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   Fatigue 09/17/2022   Left hemiparesis (HCC) 09/17/2022   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HCC) 08/22/2022   GERD (gastroesophageal reflux disease) 08/21/2022   Agitation 08/20/2022   Acute left-sided weakness 08/20/2022   Expressive aphasia    Stroke (HCC) 08/19/2022   Leukocytosis 08/19/2022   History of urticaria 04/28/2022   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   Primary osteoarthritis of left knee 02/24/2022   Primary osteoarthritis of right knee  02/24/2022   Lumbar spondylolysis 04/12/2020   History of CVA (cerebrovascular accident) 03/26/2020   Low back pain radiating to right lower extremity 03/21/2020   B12 deficiency 03/06/2020   Positive anti-CCP test 12/21/2019   Arthralgia 12/13/2019   Dermatitis 12/13/2019   Rheumatoid factor positive 12/13/2019   Essential hypertension 12/11/2018   Palpitations 12/11/2018   Acquired hypothyroidism 11/10/2018   Arthritis of knee 09/17/2016   Anxiety 11/22/2014   Asthma without status asthmaticus 11/22/2014   Benign neoplasm of colon, unspecified 11/22/2014   Environmental allergies 11/22/2014   Hypertriglyceridemia 11/22/2014   Hypokalemia 11/22/2014   Personal history of disease of skin and subcutaneous tissue 11/22/2014   REFERRING DIAG: CVA   THERAPY DIAG:  Muscle weakness (generalized)   Other lack of coordination   Rationale for Evaluation and Treatment Rehabilitation   SUBJECTIVE:   Pt. reports that she is feeling some better from being sick. Pt. Reports that her stomach is still slightly upset from being sick   SUBJECTIVE STATEMENT:   Pt accompanied by: significant other   PERTINENT HISTORY: Patient is an 82 year-old female who was admitted to [facility] on 11/23/2022 with a cerebellar CVA. 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 Pt. Was admitted to Inpatient Rehab from 12/21-1/02/2023. 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  Patient underwent inpatient rehabilitation for 2 weeks.  Patient was assessed and was scheduled for a knee replacement on 08/29/2022 however had to cancel it due to having had a CVA.  Patient had a recent fall 2 days after discharging from inpatient rehab. Past Medical History includes: Knee replacement, essential HTN, hypokalemia, leukocytosis, seizures, positive anti-- CCP test, anxiety disorder, mini strokes.  Patient had shingles with left eye nerve pain s/p 1  year ago.    PRECAUTIONS: Fall   WEIGHT BEARING RESTRICTIONS No   PAIN:  Are you having pain? Pt. Reported that she is in no pain today because she has not been moving L shoulder much.   FALLS: Has patient fallen in last 6 months? Yes. Number of falls 1   LIVING ENVIRONMENT: Lives with: Lives with Spouse Lives in: House/apartment Stairs: 2 storey home, resides on the first floor.  External: 2 stairs front no rails, and 6 in back with rails Has following equipment at home: Single point cane, Walker - 2 wheeled, Environmental consultant - 4 wheeled, Shower bench, and bed side commode   PLOF: Independent   PATIENT GOALS  To Regain the use of her left arm   OBJECTIVE:    HAND DOMINANCE: Right   ADLs: Overall ADLs: Husband assists pt. as needed Transfers/ambulation  related to ADLs:Pt. Uses a 3 wheeled walker with Husband assist. Eating: Pt. Is independent with the right hand. Pt. has difficulty cutting food. Grooming: Pt. is using her right hand, however has difficulty sustaining her LUE in elevation to assist with haircare. UB Dressing: Pt. Is independent donning a pullover shirt, and button down shirt. Has difficulty with buttoning, LB Dressing:  Independent donning pants, and socks. Difficulty tying shoes. Toileting: Independent Bathing: Pt. Is able to engage her right hand. Tub Shower transfers: Supervision Equipment: See above for equipment     IADLs: Shopping:  Has not had the opportunity for grocery shopping yet Light housekeeping: Husband is assisting with light house keeping Meal Prep:  Dependent Community mobility: Relies of family/friends Medication management: Husband assisting with weekly pillbox set-up, and administering medication. Financial management: TBD Handwriting: 75% legible   MOBILITY STATUS: Hx of falls   POSTURE COMMENTS:  No Significant postural limitations Sitting balance: supported sitting balance WFL   ACTIVITY TOLERANCE: Activity tolerance:  Fatigues in  greater than 30 min.    FUNCTIONAL OUTCOME MEASURES: FOTO: 57  05/28/2023  FOTO:    UPPER EXTREMITY ROM      Active ROM Right Eval: [facility] Left eval Left  01/22/23 Left  03/03/23 Left  04/14/23 Left  05/28/2023  Shoulder flexion   132 100 108 108 108  Shoulder abduction   80 85 85 85 85  Shoulder adduction          Shoulder extension          Shoulder internal rotation          Shoulder external rotation          Elbow flexion   140 140 WFL WFL WFL  Elbow extension   WNL WNL [facility] WFL WFL  Wrist flexion   65 68     Wrist extension   -10 20 24  32 32  Wrist ulnar deviation     12 10 14 14   Wrist radial deviation     8 14 14 16   Wrist pronation          Wrist supination          (Blank rows = not tested)   Left digit flexion to [facility]: 2nd: 0cm, 3rd: 0cm, 4th: 0cm, 5th: 0cm   Limited Left full 2nd digit extension     UPPER EXTREMITY MMT:      MMT Right Eval: 4+/5 overall Left Eval Left 01/22/23 Left 03/03/23 Left  04/14/2023 Left 05/28/2023  Shoulder flexion   3/5 3-/5 3-/5 3-/5 3-/5  Shoulder abduction   3-/5 3-/5 3-/5 3-/5 3-/5  Shoulder adduction          Shoulder extension          Shoulder internal rotation          Shoulder external rotation          Middle trapezius          Lower trapezius          Elbow flexion   3+/5 4/5 N/A 4/5 4/5  Elbow extension   3+/5 4/4 N/A 4/5 4/5  Wrist flexion          Wrist extension   2-/5 3-/5 3-/5 3/5 3+/5  Wrist ulnar deviation          Wrist radial deviation          Wrist pronation          Wrist supination          (  Blank rows = not tested)   HAND FUNCTION: Grip strength: Right: 26#, Left: 10# Pinch strength: Right 8#, Left: 3#, 3 Pt. Pinch strength: Right: 9#, L: 2#  01/22/2023 Grip strength: Right: 26#, Left: 12# Pinch strength:  Pinch meter used at the initial eval has been sent out for recalibration   03/03/2023 Grip strength: Right: 26#, Left: 13# Pinch strength:  Pinch meter used at the initial eval has been sent  out for recalibration  04/14/2023: Grip strength: Right: 26#, Left: 13# Pinch strength:   Right 8#, Left: 4#, 3 Pt. Pinch strength: Right: 9#, L: 3#    05/28/2023: Grip strength: Right: 26#, Left: 13# Pinch strength:   Right 8#, Left: 2#, 3 Pt. Pinch strength: Right: 9#, L: 4#     COORDINATION: Right: 22 sec., Left: <5 min. To place 7 pegs with increased compensation proximally in the trunk, and through reflexive associated reactions.  01/22/23 Right: 22 sec., Left: 3 min. & 4 sec.  03/03/23 Right: 22 sec., Left: 1 min. & 39 sec.  04/14/23   TBD  05/28/23 Right: 22 sec., Left:  5 min.      SENSATION: Light touch: WFL, proprioceptive awareness: Intact   EDEMA: N/A   MUSCLE TONE: LUE: Hypotonic   COGNITION: Overall cognitive status: WFL for tasks assessed. Pt. Is impulsive at times.   VISION: Subjective report: Pt. report having shingles affecting left eye  s/p 1 year. Has nerve pain Baseline vision: Wears glasses for reading only Visual history:  updated see clinical impression   VISION ASSESSMENT:    WFL for tasks performed   PERCEPTION: Intact   PRAXIS: Impaired: Motor planning   OBSERVATIONS:  Pt. more alert, and engaging since prior to the most recent hospitalization.     TODAY'S TREATMENT:    Therapeutic Ex.   Pt. Worked on L 2nd - 5th digit extension at the tabletop. Pt. Worked on reps of digit extension with resistance. Pt. Performed active supination with a hand held wooden dowel x 10 reps. Pt. worked on supination with resistance using the EZ board with multiple handles for gross gripping and 3 point pinch.   Neuromuscular re-education:    Pt. worked on left hand [facility] skills using isolated 2nd digit extension to slide sequencing cards off the edge of table to the thumb for a 2 point grasp. Pt. worked on combining movements with forearm supination prior to placing card on table top. Pt. Flipped over a total of 19 cards.       PATIENT  EDUCATION: Education details: left hand digit extension Person educated: Patient and Spouse Education method: Medical illustrator Education comprehension: verbalized understanding, returned demonstration, and needs further education     HOME EXERCISE PROGRAM:    Reviewed activities at home to promote isolated 2nd digit extension.    GOALS: Goals reviewed with patient? Yes   SHORT TERM GOALS: Target date:  07/09/2023         1. Patient will be independent with home exercise program for the left upper extremity Baseline: 05/28/2023: Pt. Requires assist 04/09/2023: Pt. Continues to consistently attempt to engage her hand at hand 02/01/2023: Pt. Consistently attempts to perform HEPs independently. No current home exercise program Goal status: Ongoing     LONG TERM GOALS: Target date:  08/20/2023     1.  Patient will improve left shoulder strength by 2 mm grades to be able to sustain UE's in elevation long enough to wash her hair.  Baseline: 05/28/2023: Left shoulder flexion:  3-/5, abduction: 3-/5. Pt. Has an old left shoulder injury limiting progression with left shoulder strength 05/26/2023: Pt. continues to present with limitiations in sustaining LUE elevation long enough to perform hair care thoroughly 04/09/2023: Pt. is limited with sustaining LUE elevation long enough to perform hair care thoroughly. 03/03/2023: Left shoulder flexion: 3-/5, abduction: 3-/5 01/22/2023: Left shoulder flexion: 3-/5, abduction: 3-/5 Eval: Left shoulder flexion: 3/5, abduction: 3-/5 Goal status: Not met, Deferred   2.  Patient will improve left shoulder active abduction to be able to comb her hair Baseline: 05/28/2023: left shoulder flexion: 108 abduction: 85 Pt. Has an old left shoulder injury limiting progression with left shoulder ROM 05/26/2023:  5/10 left shoulder pain with ROM limits using it functionally during hair care. 04/09/2023: Pt. Presents with difficulty abducting her left shoulder enough to  thoroughly complete haircare. 03/03/2023: Shoulder abduction: 85 01/22/2023: Shoulder abduction: 85 Eval: Left shoulder abduction is 80(108) Goal status: Not met, Deferred   3.  Patient will independently button her shirt with modified independence. Baseline: 05/28/2023: pt. Continues to work towards progressing with buttoning 05/26/2023:  Pt. Continues to progress towards buttoning. 04/09/2023: Pt. Continues to progress towards buttoning. 03/03/2023: Pt. Continues to have difficulty with buttoning. 01/22/2023: Pt. continues to have difficulty. Eval: Patient has difficulty.  Goal status: Ongoing   4.  Patient well improve left grip strength in preparation for securely holding flowers. Baseline: 05/28/2023: Right: 26#, Left: 13#  pt. Presents with difficulty securely holding flowers in her left hand. 05/26/2023: TBD 04/09/2023: Pt. Is able to hold, and hike pants with the left hand, continues to have difficulty with securely holding flowers.   03/03/2023: left grip strength: 13# 01/22/2023: Left: 12# Eval: Pt. Is unable to securely hold flowers. Goal status: Ongoing   5.  Pt. will independently recall adaptive  strategies for performing ADL tasks including: flossing teeth, donning bra, applying makeup. Baseline: 05/28/2023: Pt. Needs continued education about adaptive strategies.05/26/2023: Continue 04/09/2023: Continue3/26/2024: Continue 01/22/2023: Pt. continues to benefit from education about adaptive strategies during ADLs, and IADLs. Eval: Pt. to be provided with adaptive strategies. Goal status: Ongoing   6.  Pt. will improve FOTO score by 2 points to reflect patient perceived performance improvement assessment specific ADLs  and IADLs Baseline: 05/11/5408: FOTO score 59 05/26/2023: TBD 5/02/204:  TBD 03/03/23: FOTO 62 Eval: 57 Goal status: Ongoing  7.  Pt. will improve left hand coordination skills in order to be able to handle, and sort utensils in a drawer.     Baseline: 05/28/2023: Left:  5 min. 05/26/2023  Pt. Continues to present with difficulty handling and sorting utensils. 04/14/2023: 56 04/09/2023: TBD 03/03/23: 1 min. & 39 01/22/2022: Left: 3 min. & 4 sec. Eval: Pt. has difficulty sorting, and placing utensils with the left hand. Left FMC : >5 min. For 7 pegs on the 9 hole peg test.    Goal Status:  Ongoing  8. Pt. will improve active left 2nd digit extension to be able able to isolate her 2nd digit in preparation for pressing/pushing buttons on appliances, phones, or remotes. Baseline: 05/28/2023: Pt. Continues to improve with left 2nd digit extension 05/26/2023: Improving with left 2nd digit extension. 04/09/2023: Pt. Is progressing with isolating left 2nd digit extension, however continues to present with limited increased flexor tone. 03/03/23: Pt. Continues to work on improving consistency with 2nd digit extension to press the remote. 01/22/2023: is able to perform full digit extension, although 2nd digit is slow to extend s 2/2 flexor tone. Pt.  Eval: Pt. is able to is unable to actively perform full digit extension Goal status:  Ongoing   ASSESSMENT:   CLINICAL IMPRESSION:  Pt. Has had a stomach bug recently and reports that she is feeling some better today but her stomach is still slightly upset. Pt. Worked on active supination with the hand held dowel. Pt was able to perform 10 reps with increased verbal cues to ensure she was rotating her hand over the correct way. Pt. Was encouraged to use the hand held wooden dowel she has at home. Pt. worked on supination with resistance using the EZ board with multiple handles for gross gripping and 3 point pinch. Pt. Presented with decreased tightness with L forearm supination towards the end of the session. Pt. Continues to be able to start combinations of movement patterns and presented with increased 2nd digit extension. Pt continues to benefit from skilled OT services to work on improving L shoulder, elbow, and wrist strength, improving motor control and  coordination in order to prepare the left upper extremity and hand for functional reaching and engaging the upper left extremity when performing hair care, buttoning, holding flowers, cutting food, and manipulating utensils to set the table as well as adaptive strategies during daily IADL care.     PERFORMANCE DEFICITS in functional skills including ADLs, IADLs, coordination, proprioception, ROM, strength, FMC, and GMC, cognitive skills including memory, and psychosocial skills including coping strategies, environmental adaptation, interpersonal interactions, and routines and behaviors.    IMPAIRMENTS are limiting patient from ADLs, IADLs, education, leisure, and social participation.    COMORBIDITIES may have co-morbidities  that affects occupational performance. Patient will benefit from skilled OT to address above impairments and improve overall function.   MODIFICATION OR ASSISTANCE TO COMPLETE EVALUATION: Min-Moderate modification of tasks or assist with assess necessary to complete an evaluation.   OT OCCUPATIONAL PROFILE AND HISTORY: Detailed assessment: Review of records and additional review of physical, cognitive, psychosocial history related to current functional performance.   CLINICAL DECISION MAKING: Moderate - several treatment options, min-mod task modification necessary   REHAB POTENTIAL: Good   EVALUATION COMPLEXITY: Moderate      PLAN: OT FREQUENCY: 2x/week   OT DURATION: 12 weeks   PLANNED INTERVENTIONS: self care/ADL training, therapeutic exercise, therapeutic activity, neuromuscular re-education, manual therapy, passive range of motion, functional mobility training, electrical stimulation, and paraffin   RECOMMENDED OTHER SERVICES: PT   CONSULTED AND AGREED WITH PLAN OF CARE: Patient and family member/caregiver   PLAN FOR NEXT SESSION: Initiate OT treatment  Korryn Pancoast, OTS 4:20 PM This entire session was performed under the direct supervision and  direction of a licensed therapist. I have personally read, edited, and approve of the note as written.   Olegario Messier, MS, OTR/L  06/02/2023

## 2023-06-04 ENCOUNTER — Ambulatory Visit: Payer: Medicare HMO | Admitting: Occupational Therapy

## 2023-06-08 DIAGNOSIS — J301 Allergic rhinitis due to pollen: Secondary | ICD-10-CM | POA: Diagnosis not present

## 2023-06-09 ENCOUNTER — Ambulatory Visit: Payer: Medicare HMO | Attending: Physical Medicine and Rehabilitation | Admitting: Occupational Therapy

## 2023-06-09 DIAGNOSIS — M6281 Muscle weakness (generalized): Secondary | ICD-10-CM | POA: Diagnosis not present

## 2023-06-09 DIAGNOSIS — R2681 Unsteadiness on feet: Secondary | ICD-10-CM | POA: Diagnosis not present

## 2023-06-09 DIAGNOSIS — R278 Other lack of coordination: Secondary | ICD-10-CM | POA: Diagnosis not present

## 2023-06-09 DIAGNOSIS — R531 Weakness: Secondary | ICD-10-CM | POA: Diagnosis not present

## 2023-06-09 DIAGNOSIS — R262 Difficulty in walking, not elsewhere classified: Secondary | ICD-10-CM | POA: Insufficient documentation

## 2023-06-09 NOTE — Therapy (Cosign Needed)
Occupational Therapy Neuro Treatment Note   Patient Name: [name] MRN: [id number] DOB:Oct 29, 1941, 82 y.o., female Today's Date: 06/09/2023  PCP: Dr. Judithann Sheen REFERRING PROVIDER:  Dr. Judithann Sheen  END OF SESSION:  PLAN:   OT End of Session - 06/09/23 1651     Visit Number 43    Number of Visits 72    Date for OT Re-Evaluation 08/20/23    OT Start Time 1600    OT Stop Time 1645    OT Time Calculation (min) 45 min    Equipment Utilized During Treatment Wheelchair    Activity Tolerance Patient tolerated treatment well    Behavior During Therapy WFL for tasks assessed/performed                      Past Medical History:  Diagnosis Date   Anemia    Arthritis    Asthma    uses inhaler just prior to surgery to avoid attack   Back pain    from previous injury   Complication of anesthesia    has woken  up during 2 different surgery   Depression    no current issue/treatment; situation   Gallstones    GERD (gastroesophageal reflux disease)    Hiatal hernia    patient does NOT have nerve/muscle disease   History of kidney stones    HLD (hyperlipidemia)    HTN (hypertension)    Hypothyroidism    Kidney stones    Knee pain    Nausea and vomiting 10/15/2022   Non-diabetic pancreatic hormone dysfunction years   pt. 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   Pancreatitis    Pneumonia    Seizures (HCC)    caused by dye injected during a procedure   Shortness of breath    with exertion   Sinus problem    frequent infections/congestion   Stroke (HCC) 2021   reports having CVA in 2021 and having mini strokes before that   Thyroid disease    Past Surgical History:  Procedure Laterality Date   ABDOMINAL HYSTERECTOMY     APPENDECTOMY     CARPAL TUNNEL RELEASE  10+ years ago   bilateral   EYE SURGERY  3 yrs ago   bilateral cataracts   FOOT OSTEOTOMY  6 weeks ago   Left foot: great, 2nd & 3rd   FOOT OSTEOTOMY  5 years ago   Right great toe   HAND SURGERY  Bilateral 2011-most recent   multiple hand surgeries, 2 on left, 3 on right   KNEE ARTHROPLASTY Right 04/28/2022   Procedure: COMPUTER ASSISTED TOTAL KNEE ARTHROPLASTY;  Surgeon: Donato Heinz, MD;  Location: [facility];  Service: Orthopedics;  Laterality: Right;   LOOP RECORDER INSERTION N/A 05/16/2020   Procedure: LOOP RECORDER INSERTION;  Surgeon: Marcina Millard, MD;  Location: [facility];  Service: Cardiovascular;  Laterality: N/A;   NASAL SINUS SURGERY  most recent 7-8 yrs ago   7 sinus surgeries    TRIGGER FINGER RELEASE  11/19/2011   Procedure: RELEASE TRIGGER FINGER/A-1 PULLEY;  Surgeon: Nicki Reaper, MD;  Location: [facility];  Service: Orthopedics;  Laterality: Right;  release a-1 pulley right index finger and cyst removal   WRIST GANGLION EXCISION  1980's   right   Patient Active Problem List   Diagnosis Date Noted   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 11/27/2022   Hypomagnesemia 11/27/2022   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   HLD (  hyperlipidemia) 11/23/2022   Asthma 11/23/2022   Depression with anxiety 11/23/2022   Chronic diastolic CHF (congestive heart failure) (HCC) 11/23/2022   Normocytic anemia 11/23/2022   Aspiration pneumonia (HCC) 11/23/2022   AKI (acute kidney injury) (HCC) 11/23/2022   Abdominal pain 11/23/2022   Mesenteric mass 11/23/2022   Coffee ground emesis 11/23/2022   Nausea and vomiting 10/15/2022   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   Fatigue 09/17/2022   Left hemiparesis (HCC) 09/17/2022   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HCC) 08/22/2022   GERD (gastroesophageal reflux disease) 08/21/2022   Agitation 08/20/2022   Acute left-sided weakness 08/20/2022   Expressive aphasia    Stroke (HCC) 08/19/2022   Leukocytosis 08/19/2022   History of urticaria 04/28/2022   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   Primary osteoarthritis of left knee 02/24/2022   Primary osteoarthritis of right knee  02/24/2022   Lumbar spondylolysis 04/12/2020   History of CVA (cerebrovascular accident) 03/26/2020   Low back pain radiating to right lower extremity 03/21/2020   B12 deficiency 03/06/2020   Positive anti-CCP test 12/21/2019   Arthralgia 12/13/2019   Dermatitis 12/13/2019   Rheumatoid factor positive 12/13/2019   Essential hypertension 12/11/2018   Palpitations 12/11/2018   Acquired hypothyroidism 11/10/2018   Arthritis of knee 09/17/2016   Anxiety 11/22/2014   Asthma without status asthmaticus 11/22/2014   Benign neoplasm of colon, unspecified 11/22/2014   Environmental allergies 11/22/2014   Hypertriglyceridemia 11/22/2014   Hypokalemia 11/22/2014   Personal history of disease of skin and subcutaneous tissue 11/22/2014   REFERRING DIAG: CVA   THERAPY DIAG:  Muscle weakness (generalized)   Other lack of coordination   Rationale for Evaluation and Treatment Rehabilitation   SUBJECTIVE:   Pt. reports that she is feeling much better now.   SUBJECTIVE STATEMENT:   Pt accompanied by: significant other   PERTINENT HISTORY: Patient is an 82 year-old female who was admitted to [facility] on 11/23/2022 with a cerebellar CVA. 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 Pt. Was admitted to Inpatient Rehab from 12/21-1/02/2023. 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  Patient underwent inpatient rehabilitation for 2 weeks.  Patient was assessed and was scheduled for a knee replacement on 08/29/2022 however had to cancel it due to having had a CVA.  Patient had a recent fall 2 days after discharging from inpatient rehab. Past Medical History includes: Knee replacement, essential HTN, hypokalemia, leukocytosis, seizures, positive anti-- CCP test, anxiety disorder, mini strokes.  Patient had shingles with left eye nerve pain s/p 1 year ago.    PRECAUTIONS: Fall   WEIGHT BEARING RESTRICTIONS No   PAIN:   Are you having pain? Slight intermittent thumb pain radiating up the arm when doing EZ board exercises and rates it a 3/10   FALLS: Has patient fallen in last 6 months? Yes. Number of falls 1   LIVING ENVIRONMENT: Lives with: Lives with Spouse Lives in: House/apartment Stairs: 2 storey home, resides on the first floor.  External: 2 stairs front no rails, and 6 in back with rails Has following equipment at home: Single point cane, Walker - 2 wheeled, Environmental consultant - 4 wheeled, Shower bench, and bed side commode   PLOF: Independent   PATIENT GOALS  To Regain the use of her left arm   OBJECTIVE:    HAND DOMINANCE: Right   ADLs: Overall ADLs: Husband assists pt. as needed Transfers/ambulation related to ADLs:Pt. Uses a 3 wheeled walker with Husband assist. Eating: Pt. Is  independent with the right hand. Pt. has difficulty cutting food. Grooming: Pt. is using her right hand, however has difficulty sustaining her LUE in elevation to assist with haircare. UB Dressing: Pt. Is independent donning a pullover shirt, and button down shirt. Has difficulty with buttoning, LB Dressing:  Independent donning pants, and socks. Difficulty tying shoes. Toileting: Independent Bathing: Pt. Is able to engage her right hand. Tub Shower transfers: Supervision Equipment: See above for equipment     IADLs: Shopping:  Has not had the opportunity for grocery shopping yet Light housekeeping: Husband is assisting with light house keeping Meal Prep:  Dependent Community mobility: Relies of family/friends Medication management: Husband assisting with weekly pillbox set-up, and administering medication. Financial management: TBD Handwriting: 75% legible   MOBILITY STATUS: Hx of falls   POSTURE COMMENTS:  No Significant postural limitations Sitting balance: supported sitting balance WFL   ACTIVITY TOLERANCE: Activity tolerance:  Fatigues in greater than 30 min.    FUNCTIONAL OUTCOME MEASURES: FOTO:  57  05/28/2023  FOTO:    UPPER EXTREMITY ROM      Active ROM Right Eval: [facility] Left eval Left  01/22/23 Left  03/03/23 Left  04/14/23 Left  05/28/2023  Shoulder flexion   132 100 108 108 108  Shoulder abduction   80 85 85 85 85  Shoulder adduction          Shoulder extension          Shoulder internal rotation          Shoulder external rotation          Elbow flexion   140 140 WFL WFL WFL  Elbow extension   WNL WNL [facility] WFL WFL  Wrist flexion   65 68     Wrist extension   -10 20 24  32 32  Wrist ulnar deviation     12 10 14 14   Wrist radial deviation     8 14 14 16   Wrist pronation          Wrist supination          (Blank rows = not tested)   Left digit flexion to [employer]: 2nd: 0cm, 3rd: 0cm, 4th: 0cm, 5th: 0cm   Limited Left full 2nd digit extension     UPPER EXTREMITY MMT:      MMT Right Eval: 4+/5 overall Left Eval Left 01/22/23 Left 03/03/23 Left  04/14/2023 Left 05/28/2023  Shoulder flexion   3/5 3-/5 3-/5 3-/5 3-/5  Shoulder abduction   3-/5 3-/5 3-/5 3-/5 3-/5  Shoulder adduction          Shoulder extension          Shoulder internal rotation          Shoulder external rotation          Middle trapezius          Lower trapezius          Elbow flexion   3+/5 4/5 N/A 4/5 4/5  Elbow extension   3+/5 4/4 N/A 4/5 4/5  Wrist flexion          Wrist extension   2-/5 3-/5 3-/5 3/5 3+/5  Wrist ulnar deviation          Wrist radial deviation          Wrist pronation          Wrist supination          (Blank rows = not tested)   HAND FUNCTION:  Grip strength: Right: 26#, Left: 10# Pinch strength: Right 8#, Left: 3#, 3 Pt. Pinch strength: Right: 9#, L: 2#  01/22/2023 Grip strength: Right: 26#, Left: 12# Pinch strength:  Pinch meter used at the initial eval has been sent out for recalibration   03/03/2023 Grip strength: Right: 26#, Left: 13# Pinch strength:  Pinch meter used at the initial eval has been sent out for recalibration  04/14/2023: Grip strength: Right:  26#, Left: 13# Pinch strength:   Right 8#, Left: 4#, 3 Pt. Pinch strength: Right: 9#, L: 3#    05/28/2023: Grip strength: Right: 26#, Left: 13# Pinch strength:   Right 8#, Left: 2#, 3 Pt. Pinch strength: Right: 9#, L: 4#     COORDINATION: Right: 22 sec., Left: <5 min. To place 7 pegs with increased compensation proximally in the trunk, and through reflexive associated reactions.  01/22/23 Right: 22 sec., Left: 3 min. & 4 sec.  03/03/23 Right: 22 sec., Left: 1 min. & 39 sec.  04/14/23   TBD  05/28/23 Right: 22 sec., Left:  5 min.      SENSATION: Light touch: WFL, proprioceptive awareness: Intact   EDEMA: N/A   MUSCLE TONE: LUE: Hypotonic   COGNITION: Overall cognitive status: WFL for tasks assessed. Pt. Is impulsive at times.   VISION: Subjective report: Pt. report having shingles affecting left eye  s/p 1 year. Has nerve pain Baseline vision: Wears glasses for reading only Visual history:  updated see clinical impression   VISION ASSESSMENT:    WFL for tasks performed   PERCEPTION: Intact   PRAXIS: Impaired: Motor planning   OBSERVATIONS:  Pt. more alert, and engaging since prior to the most recent hospitalization.     TODAY'S TREATMENT:    Therapeutic Ex.   Pt. Worked on L 2nd - 5th digit extension at the tabletop. Pt. Worked on reps of digit extension with resistance. Pt. Performed active supination with a hand held wooden dowel x 10 reps. Pt. worked on supination with resistance using the EZ board with multiple handles for gross gripping and 3 point pinch.   Neuromuscular re-education:    Pt. worked on left hand [facility] skills by using tip pinch to pick up 3/4" flat marble followed by using translatory movement to move into the palm and them back to the tip of the finger before discarding it onto the top of container. Activity was modified to pt. Picking up marble using tip pinch and then translating to the palm followed by turning forearm into pronation to help  discard marble onto the top of the container followed by finger extension after releasing.        PATIENT EDUCATION: Education details: left hand digit extension Person educated: Patient and Spouse Education method: Medical illustrator Education comprehension: verbalized understanding, returned demonstration, and needs further education     HOME EXERCISE PROGRAM:    Reviewed activities at home to promote isolated 2nd digit extension.    GOALS: Goals reviewed with patient? Yes   SHORT TERM GOALS: Target date:  07/09/2023         1. Patient will be independent with home exercise program for the left upper extremity Baseline: 05/28/2023: Pt. Requires assist 04/09/2023: Pt. Continues to consistently attempt to engage her hand at hand 02/01/2023: Pt. Consistently attempts to perform HEPs independently. No current home exercise program Goal status: Ongoing     LONG TERM GOALS: Target date:  08/20/2023     1.  Patient will improve left shoulder strength by  2 mm grades to be able to sustain UE's in elevation long enough to wash her hair.  Baseline: 05/28/2023: Left shoulder flexion: 3-/5, abduction: 3-/5. Pt. Has an old left shoulder injury limiting progression with left shoulder strength 05/26/2023: Pt. continues to present with limitiations in sustaining LUE elevation long enough to perform hair care thoroughly 04/09/2023: Pt. is limited with sustaining LUE elevation long enough to perform hair care thoroughly. 03/03/2023: Left shoulder flexion: 3-/5, abduction: 3-/5 01/22/2023: Left shoulder flexion: 3-/5, abduction: 3-/5 Eval: Left shoulder flexion: 3/5, abduction: 3-/5 Goal status: Not met, Deferred   2.  Patient will improve left shoulder active abduction to be able to comb her hair Baseline: 05/28/2023: left shoulder flexion: 108 abduction: 85 Pt. Has an old left shoulder injury limiting progression with left shoulder ROM 05/26/2023:  5/10 left shoulder pain with ROM limits using it  functionally during hair care. 04/09/2023: Pt. Presents with difficulty abducting her left shoulder enough to thoroughly complete haircare. 03/03/2023: Shoulder abduction: 85 01/22/2023: Shoulder abduction: 85 Eval: Left shoulder abduction is 80(108) Goal status: Not met, Deferred   3.  Patient will independently button her shirt with modified independence. Baseline: 05/28/2023: pt. Continues to work towards progressing with buttoning 05/26/2023:  Pt. Continues to progress towards buttoning. 04/09/2023: Pt. Continues to progress towards buttoning. 03/03/2023: Pt. Continues to have difficulty with buttoning. 01/22/2023: Pt. continues to have difficulty. Eval: Patient has difficulty.  Goal status: Ongoing   4.  Patient well improve left grip strength in preparation for securely holding flowers. Baseline: 05/28/2023: Right: 26#, Left: 13#  pt. Presents with difficulty securely holding flowers in her left hand. 05/26/2023: TBD 04/09/2023: Pt. Is able to hold, and hike pants with the left hand, continues to have difficulty with securely holding flowers.   03/03/2023: left grip strength: 13# 01/22/2023: Left: 12# Eval: Pt. Is unable to securely hold flowers. Goal status: Ongoing   5.  Pt. will independently recall adaptive  strategies for performing ADL tasks including: flossing teeth, donning bra, applying makeup. Baseline: 05/28/2023: Pt. Needs continued education about adaptive strategies.05/26/2023: Continue 04/09/2023: Continue3/26/2024: Continue 01/22/2023: Pt. continues to benefit from education about adaptive strategies during ADLs, and IADLs. Eval: Pt. to be provided with adaptive strategies. Goal status: Ongoing   6.  Pt. will improve FOTO score by 2 points to reflect patient perceived performance improvement assessment specific ADLs  and IADLs Baseline: 1/61/0960: FOTO score 59 05/26/2023: TBD 5/02/204:  TBD 03/03/23: FOTO 62 Eval: 57 Goal status: Ongoing  7.  Pt. will improve left hand coordination skills in  order to be able to handle, and sort utensils in a drawer.     Baseline: 05/28/2023: Left:  5 min. 05/26/2023 Pt. Continues to present with difficulty handling and sorting utensils. 04/14/2023: 56 04/09/2023: TBD 03/03/23: 1 min. & 39 01/22/2022: Left: 3 min. & 4 sec. Eval: Pt. has difficulty sorting, and placing utensils with the left hand. Left FMC : >5 min. For 7 pegs on the 9 hole peg test.    Goal Status:  Ongoing  8. Pt. will improve active left 2nd digit extension to be able able to isolate her 2nd digit in preparation for pressing/pushing buttons on appliances, phones, or remotes. Baseline: 05/28/2023: Pt. Continues to improve with left 2nd digit extension 05/26/2023: Improving with left 2nd digit extension. 04/09/2023: Pt. Is progressing with isolating left 2nd digit extension, however continues to present with limited increased flexor tone. 03/03/23: Pt. Continues to work on improving consistency with 2nd digit extension to press  the remote. 01/22/2023: is able to perform full digit extension, although 2nd digit is slow to extend s 2/2 flexor tone. Pt.  Eval: Pt. is able to is unable to actively perform full digit extension Goal status:  Ongoing   ASSESSMENT:   CLINICAL IMPRESSION:  Pt. Reported that she is feeling much better this week after recovering from a stomach bug. Pt. Reports that she has not been doing anything with the L shoulder to ensure it does not get aggravated. Pt. Worked on active supination with the hand held dowel. Pt was able to perform 10 reps with decreased verbal cues to rotate the hand the correct way compared to last session. Pt. Reported that she has not been doing wooden dowel exercises at home. Pt. worked on supination with resistance using the EZ board with multiple handles for gross gripping and 3 point pinch. Activity was cut short due to thumb pain when using three point pinch. Pt. Presented with decreased tightness with L forearm supination towards the end of the session.  Pt. Was able to translate 3/4" flat marble from tip pinch to the palm however required the forearm to be in a pronation position to be able to discard. Pt. Continues to be able to start combinations of movement patterns and presented with increased 2nd digit extension. Pt continues to benefit from skilled OT services to work on improving L shoulder, elbow, and wrist strength, improving motor control and coordination in order to prepare the left upper extremity and hand for functional reaching and engaging the upper left extremity when performing hair care, buttoning, holding flowers, cutting food, and manipulating utensils to set the table as well as adaptive strategies during daily IADL care.     PERFORMANCE DEFICITS in functional skills including ADLs, IADLs, coordination, proprioception, ROM, strength, FMC, and GMC, cognitive skills including memory, and psychosocial skills including coping strategies, environmental adaptation, interpersonal interactions, and routines and behaviors.    IMPAIRMENTS are limiting patient from ADLs, IADLs, education, leisure, and social participation.    COMORBIDITIES may have co-morbidities  that affects occupational performance. Patient will benefit from skilled OT to address above impairments and improve overall function.   MODIFICATION OR ASSISTANCE TO COMPLETE EVALUATION: Min-Moderate modification of tasks or assist with assess necessary to complete an evaluation.   OT OCCUPATIONAL PROFILE AND HISTORY: Detailed assessment: Review of records and additional review of physical, cognitive, psychosocial history related to current functional performance.   CLINICAL DECISION MAKING: Moderate - several treatment options, min-mod task modification necessary   REHAB POTENTIAL: Good   EVALUATION COMPLEXITY: Moderate      PLAN: OT FREQUENCY: 2x/week   OT DURATION: 12 weeks   PLANNED INTERVENTIONS: self care/ADL training, therapeutic exercise, therapeutic activity,  neuromuscular re-education, manual therapy, passive range of motion, functional mobility training, electrical stimulation, and paraffin   RECOMMENDED OTHER SERVICES: PT   CONSULTED AND AGREED WITH PLAN OF CARE: Patient and family member/caregiver   PLAN FOR NEXT SESSION: Initiate OT treatment  Herma Carson, OTS 4:52 PM 06/09/2023

## 2023-06-16 ENCOUNTER — Ambulatory Visit: Payer: Medicare HMO | Admitting: Occupational Therapy

## 2023-06-16 DIAGNOSIS — M6281 Muscle weakness (generalized): Secondary | ICD-10-CM

## 2023-06-16 DIAGNOSIS — R2681 Unsteadiness on feet: Secondary | ICD-10-CM | POA: Diagnosis not present

## 2023-06-16 DIAGNOSIS — R278 Other lack of coordination: Secondary | ICD-10-CM | POA: Diagnosis not present

## 2023-06-16 DIAGNOSIS — R531 Weakness: Secondary | ICD-10-CM | POA: Diagnosis not present

## 2023-06-16 DIAGNOSIS — R292 Abnormal reflex: Secondary | ICD-10-CM | POA: Diagnosis not present

## 2023-06-16 DIAGNOSIS — I6389 Other cerebral infarction: Secondary | ICD-10-CM | POA: Diagnosis not present

## 2023-06-16 DIAGNOSIS — G43109 Migraine with aura, not intractable, without status migrainosus: Secondary | ICD-10-CM | POA: Diagnosis not present

## 2023-06-16 DIAGNOSIS — R262 Difficulty in walking, not elsewhere classified: Secondary | ICD-10-CM | POA: Diagnosis not present

## 2023-06-16 DIAGNOSIS — B0229 Other postherpetic nervous system involvement: Secondary | ICD-10-CM | POA: Diagnosis not present

## 2023-06-16 DIAGNOSIS — R0683 Snoring: Secondary | ICD-10-CM | POA: Diagnosis not present

## 2023-06-16 NOTE — Therapy (Cosign Needed)
Occupational Therapy Neuro Treatment Note   Patient Name: [name] MRN: [id number] DOB:1941/04/20, 82 y.o., female Today's Date: 06/16/2023  PCP: Dr. Judithann Sheen REFERRING PROVIDER:  Dr. Judithann Sheen  END OF SESSION:  PLAN:   OT End of Session - 06/16/23 1700     Visit Number 44    Number of Visits 72    Date for OT Re-Evaluation 08/20/23    OT Start Time 1615    OT Stop Time 1700    OT Time Calculation (min) 45 min    Equipment Utilized During Treatment Wheelchair    Activity Tolerance Patient tolerated treatment well    Behavior During Therapy WFL for tasks assessed/performed                        Past Medical History:  Diagnosis Date   Anemia    Arthritis    Asthma    uses inhaler just prior to surgery to avoid attack   Back pain    from previous injury   Complication of anesthesia    has woken  up during 2 different surgery   Depression    no current issue/treatment; situation   Gallstones    GERD (gastroesophageal reflux disease)    Hiatal hernia    patient does NOT have nerve/muscle disease   History of kidney stones    HLD (hyperlipidemia)    HTN (hypertension)    Hypothyroidism    Kidney stones    Knee pain    Nausea and vomiting 10/15/2022   Non-diabetic pancreatic hormone dysfunction years   pt. 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   Pancreatitis    Pneumonia    Seizures (HCC)    caused by dye injected during a procedure   Shortness of breath    with exertion   Sinus problem    frequent infections/congestion   Stroke (HCC) 2021   reports having CVA in 2021 and having mini strokes before that   Thyroid disease    Past Surgical History:  Procedure Laterality Date   ABDOMINAL HYSTERECTOMY     APPENDECTOMY     CARPAL TUNNEL RELEASE  10+ years ago   bilateral   EYE SURGERY  3 yrs ago   bilateral cataracts   FOOT OSTEOTOMY  6 weeks ago   Left foot: great, 2nd & 3rd   FOOT OSTEOTOMY  5 years ago   Right great toe   HAND SURGERY  Bilateral 2011-most recent   multiple hand surgeries, 2 on left, 3 on right   KNEE ARTHROPLASTY Right 04/28/2022   Procedure: COMPUTER ASSISTED TOTAL KNEE ARTHROPLASTY;  Surgeon: Donato Heinz, MD;  Location: [facility];  Service: Orthopedics;  Laterality: Right;   LOOP RECORDER INSERTION N/A 05/16/2020   Procedure: LOOP RECORDER INSERTION;  Surgeon: Marcina Millard, MD;  Location: [facility];  Service: Cardiovascular;  Laterality: N/A;   NASAL SINUS SURGERY  most recent 7-8 yrs ago   7 sinus surgeries    TRIGGER FINGER RELEASE  11/19/2011   Procedure: RELEASE TRIGGER FINGER/A-1 PULLEY;  Surgeon: Nicki Reaper, MD;  Location: [facility];  Service: Orthopedics;  Laterality: Right;  release a-1 pulley right index finger and cyst removal   WRIST GANGLION EXCISION  1980's   right   Patient Active Problem List   Diagnosis Date Noted   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 11/27/2022   Hypomagnesemia 11/27/2022   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  HLD (hyperlipidemia) 11/23/2022   Asthma 11/23/2022   Depression with anxiety 11/23/2022   Chronic diastolic CHF (congestive heart failure) (HCC) 11/23/2022   Normocytic anemia 11/23/2022   Aspiration pneumonia (HCC) 11/23/2022   AKI (acute kidney injury) (HCC) 11/23/2022   Abdominal pain 11/23/2022   Mesenteric mass 11/23/2022   Coffee ground emesis 11/23/2022   Nausea and vomiting 10/15/2022   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   Fatigue 09/17/2022   Left hemiparesis (HCC) 09/17/2022   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HCC) 08/22/2022   GERD (gastroesophageal reflux disease) 08/21/2022   Agitation 08/20/2022   Acute left-sided weakness 08/20/2022   Expressive aphasia    Stroke (HCC) 08/19/2022   Leukocytosis 08/19/2022   History of urticaria 04/28/2022   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   Primary osteoarthritis of left knee 02/24/2022   Primary osteoarthritis of right knee  02/24/2022   Lumbar spondylolysis 04/12/2020   History of CVA (cerebrovascular accident) 03/26/2020   Low back pain radiating to right lower extremity 03/21/2020   B12 deficiency 03/06/2020   Positive anti-CCP test 12/21/2019   Arthralgia 12/13/2019   Dermatitis 12/13/2019   Rheumatoid factor positive 12/13/2019   Essential hypertension 12/11/2018   Palpitations 12/11/2018   Acquired hypothyroidism 11/10/2018   Arthritis of knee 09/17/2016   Anxiety 11/22/2014   Asthma without status asthmaticus 11/22/2014   Benign neoplasm of colon, unspecified 11/22/2014   Environmental allergies 11/22/2014   Hypertriglyceridemia 11/22/2014   Hypokalemia 11/22/2014   Personal history of disease of skin and subcutaneous tissue 11/22/2014   REFERRING DIAG: CVA   THERAPY DIAG:  Muscle weakness (generalized)   Other lack of coordination   Rationale for Evaluation and Treatment Rehabilitation   SUBJECTIVE:   Pt. reports that she is doing well and had a Dr. appointment today with Dr. Sherryll Burger before her OT session. SUBJECTIVE STATEMENT:   Pt accompanied by: significant other   PERTINENT HISTORY: Patient is an 82 year-old female who was admitted to [facility] on 11/23/2022 with a cerebellar CVA. 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 Pt. Was admitted to Inpatient Rehab from 12/21-1/02/2023. 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  Patient underwent inpatient rehabilitation for 2 weeks.  Patient was assessed and was scheduled for a knee replacement on 08/29/2022 however had to cancel it due to having had a CVA.  Patient had a recent fall 2 days after discharging from inpatient rehab. Past Medical History includes: Knee replacement, essential HTN, hypokalemia, leukocytosis, seizures, positive anti-- CCP test, anxiety disorder, mini strokes.  Patient had shingles with left eye nerve pain s/p 1 year ago.    PRECAUTIONS:  Fall   WEIGHT BEARING RESTRICTIONS No   PAIN:  Are you having pain? Pt. Reports she is not in any pain today.    FALLS: Has patient fallen in last 6 months? Yes. Number of falls 1   LIVING ENVIRONMENT: Lives with: Lives with Spouse Lives in: House/apartment Stairs: 2 storey home, resides on the first floor.  External: 2 stairs front no rails, and 6 in back with rails Has following equipment at home: Single point cane, Walker - 2 wheeled, Environmental consultant - 4 wheeled, Shower bench, and bed side commode   PLOF: Independent   PATIENT GOALS  To Regain the use of her left arm   OBJECTIVE:    HAND DOMINANCE: Right   ADLs: Overall ADLs: Husband assists pt. as needed Transfers/ambulation related to ADLs:Pt. Uses a 3 wheeled walker with Husband assist. Eating:  Pt. Is independent with the right hand. Pt. has difficulty cutting food. Grooming: Pt. is using her right hand, however has difficulty sustaining her LUE in elevation to assist with haircare. UB Dressing: Pt. Is independent donning a pullover shirt, and button down shirt. Has difficulty with buttoning, LB Dressing:  Independent donning pants, and socks. Difficulty tying shoes. Toileting: Independent Bathing: Pt. Is able to engage her right hand. Tub Shower transfers: Supervision Equipment: See above for equipment     IADLs: Shopping:  Has not had the opportunity for grocery shopping yet Light housekeeping: Husband is assisting with light house keeping Meal Prep:  Dependent Community mobility: Relies of family/friends Medication management: Husband assisting with weekly pillbox set-up, and administering medication. Financial management: TBD Handwriting: 75% legible   MOBILITY STATUS: Hx of falls   POSTURE COMMENTS:  No Significant postural limitations Sitting balance: supported sitting balance WFL   ACTIVITY TOLERANCE: Activity tolerance:  Fatigues in greater than 30 min.    FUNCTIONAL OUTCOME MEASURES: FOTO:  57  05/28/2023  FOTO:    UPPER EXTREMITY ROM      Active ROM Right Eval: [facility] Left eval Left  01/22/23 Left  03/03/23 Left  04/14/23 Left  05/28/2023  Shoulder flexion   132 100 108 108 108  Shoulder abduction   80 85 85 85 85  Shoulder adduction          Shoulder extension          Shoulder internal rotation          Shoulder external rotation          Elbow flexion   140 140 WFL WFL WFL  Elbow extension   WNL WNL [facility] WFL WFL  Wrist flexion   65 68     Wrist extension   -10 20 24  32 32  Wrist ulnar deviation     12 10 14 14   Wrist radial deviation     8 14 14 16   Wrist pronation          Wrist supination          (Blank rows = not tested)   Left digit flexion to [facility]: 2nd: 0cm, 3rd: 0cm, 4th: 0cm, 5th: 0cm   Limited Left full 2nd digit extension     UPPER EXTREMITY MMT:      MMT Right Eval: 4+/5 overall Left Eval Left 01/22/23 Left 03/03/23 Left  04/14/2023 Left 05/28/2023  Shoulder flexion   3/5 3-/5 3-/5 3-/5 3-/5  Shoulder abduction   3-/5 3-/5 3-/5 3-/5 3-/5  Shoulder adduction          Shoulder extension          Shoulder internal rotation          Shoulder external rotation          Middle trapezius          Lower trapezius          Elbow flexion   3+/5 4/5 N/A 4/5 4/5  Elbow extension   3+/5 4/4 N/A 4/5 4/5  Wrist flexion          Wrist extension   2-/5 3-/5 3-/5 3/5 3+/5  Wrist ulnar deviation          Wrist radial deviation          Wrist pronation          Wrist supination          (Blank rows = not tested)  HAND FUNCTION: Grip strength: Right: 26#, Left: 10# Pinch strength: Right 8#, Left: 3#, 3 Pt. Pinch strength: Right: 9#, L: 2#  01/22/2023 Grip strength: Right: 26#, Left: 12# Pinch strength:  Pinch meter used at the initial eval has been sent out for recalibration   03/03/2023 Grip strength: Right: 26#, Left: 13# Pinch strength:  Pinch meter used at the initial eval has been sent out for recalibration  04/14/2023: Grip strength: Right:  26#, Left: 13# Pinch strength:   Right 8#, Left: 4#, 3 Pt. Pinch strength: Right: 9#, L: 3#    05/28/2023: Grip strength: Right: 26#, Left: 13# Pinch strength:   Right 8#, Left: 2#, 3 Pt. Pinch strength: Right: 9#, L: 4#     COORDINATION: Right: 22 sec., Left: <5 min. To place 7 pegs with increased compensation proximally in the trunk, and through reflexive associated reactions.  01/22/23 Right: 22 sec., Left: 3 min. & 4 sec.  03/03/23 Right: 22 sec., Left: 1 min. & 39 sec.  04/14/23   TBD  05/28/23 Right: 22 sec., Left:  5 min.      SENSATION: Light touch: WFL, proprioceptive awareness: Intact   EDEMA: N/A   MUSCLE TONE: LUE: Hypotonic   COGNITION: Overall cognitive status: WFL for tasks assessed. Pt. Is impulsive at times.   VISION: Subjective report: Pt. report having shingles affecting left eye  s/p 1 year. Has nerve pain Baseline vision: Wears glasses for reading only Visual history:  updated see clinical impression   VISION ASSESSMENT:    WFL for tasks performed   PERCEPTION: Intact   PRAXIS: Impaired: Motor planning   OBSERVATIONS:  Pt. more alert, and engaging since prior to the most recent hospitalization.     TODAY'S TREATMENT:    Therapeutic Ex.   Pt. Worked on L 2nd - 5th digit extension at the tabletop. Pt. Worked on reps of digit extension with resistance. Pt. Performed active supination with a hand held wooden dowel x 10 reps. Pt. worked on supination with resistance using the EZ board with multiple handles for gross gripping and 3 point pinch.   Neuromuscular re-education:  Pt. worked on left hand [facility] skills using isolated 2nd digit extension to slide sequencing cards off the edge of table to the thumb for a 2 point grasp. Pt. worked on combining movements with forearm supination prior to placing card on table top.        PATIENT EDUCATION: Education details: left hand digit extension Person educated: Patient and Spouse Education method:  Medical illustrator Education comprehension: verbalized understanding, returned demonstration, and needs further education     HOME EXERCISE PROGRAM:    Reviewed activities at home to promote isolated 2nd digit extension.    GOALS: Goals reviewed with patient? Yes   SHORT TERM GOALS: Target date:  07/09/2023         1. Patient will be independent with home exercise program for the left upper extremity Baseline: 05/28/2023: Pt. Requires assist 04/09/2023: Pt. Continues to consistently attempt to engage her hand at hand 02/01/2023: Pt. Consistently attempts to perform HEPs independently. No current home exercise program Goal status: Ongoing     LONG TERM GOALS: Target date:  08/20/2023     1.  Patient will improve left shoulder strength by 2 mm grades to be able to sustain UE's in elevation long enough to wash her hair.  Baseline: 05/28/2023: Left shoulder flexion: 3-/5, abduction: 3-/5. Pt. Has an old left shoulder injury limiting progression with left shoulder strength  05/26/2023: Pt. continues to present with limitiations in sustaining LUE elevation long enough to perform hair care thoroughly 04/09/2023: Pt. is limited with sustaining LUE elevation long enough to perform hair care thoroughly. 03/03/2023: Left shoulder flexion: 3-/5, abduction: 3-/5 01/22/2023: Left shoulder flexion: 3-/5, abduction: 3-/5 Eval: Left shoulder flexion: 3/5, abduction: 3-/5 Goal status: Not met, Deferred   2.  Patient will improve left shoulder active abduction to be able to comb her hair Baseline: 05/28/2023: left shoulder flexion: 108 abduction: 85 Pt. Has an old left shoulder injury limiting progression with left shoulder ROM 05/26/2023:  5/10 left shoulder pain with ROM limits using it functionally during hair care. 04/09/2023: Pt. Presents with difficulty abducting her left shoulder enough to thoroughly complete haircare. 03/03/2023: Shoulder abduction: 85 01/22/2023: Shoulder abduction: 85 Eval: Left shoulder  abduction is 80(108) Goal status: Not met, Deferred   3.  Patient will independently button her shirt with modified independence. Baseline: 05/28/2023: pt. Continues to work towards progressing with buttoning 05/26/2023:  Pt. Continues to progress towards buttoning. 04/09/2023: Pt. Continues to progress towards buttoning. 03/03/2023: Pt. Continues to have difficulty with buttoning. 01/22/2023: Pt. continues to have difficulty. Eval: Patient has difficulty.  Goal status: Ongoing   4.  Patient well improve left grip strength in preparation for securely holding flowers. Baseline: 05/28/2023: Right: 26#, Left: 13#  pt. Presents with difficulty securely holding flowers in her left hand. 05/26/2023: TBD 04/09/2023: Pt. Is able to hold, and hike pants with the left hand, continues to have difficulty with securely holding flowers.   03/03/2023: left grip strength: 13# 01/22/2023: Left: 12# Eval: Pt. Is unable to securely hold flowers. Goal status: Ongoing   5.  Pt. will independently recall adaptive  strategies for performing ADL tasks including: flossing teeth, donning bra, applying makeup. Baseline: 05/28/2023: Pt. Needs continued education about adaptive strategies.05/26/2023: Continue 04/09/2023: Continue3/26/2024: Continue 01/22/2023: Pt. continues to benefit from education about adaptive strategies during ADLs, and IADLs. Eval: Pt. to be provided with adaptive strategies. Goal status: Ongoing   6.  Pt. will improve FOTO score by 2 points to reflect patient perceived performance improvement assessment specific ADLs  and IADLs Baseline: 7/42/5956: FOTO score 59 05/26/2023: TBD 5/02/204:  TBD 03/03/23: FOTO 62 Eval: 57 Goal status: Ongoing  7.  Pt. will improve left hand coordination skills in order to be able to handle, and sort utensils in a drawer.     Baseline: 05/28/2023: Left:  5 min. 05/26/2023 Pt. Continues to present with difficulty handling and sorting utensils. 04/14/2023: 56 04/09/2023: TBD 03/03/23: 1 min. &  39 01/22/2022: Left: 3 min. & 4 sec. Eval: Pt. has difficulty sorting, and placing utensils with the left hand. Left FMC : >5 min. For 7 pegs on the 9 hole peg test.    Goal Status:  Ongoing  8. Pt. will improve active left 2nd digit extension to be able able to isolate her 2nd digit in preparation for pressing/pushing buttons on appliances, phones, or remotes. Baseline: 05/28/2023: Pt. Continues to improve with left 2nd digit extension 05/26/2023: Improving with left 2nd digit extension. 04/09/2023: Pt. Is progressing with isolating left 2nd digit extension, however continues to present with limited increased flexor tone. 03/03/23: Pt. Continues to work on improving consistency with 2nd digit extension to press the remote. 01/22/2023: is able to perform full digit extension, although 2nd digit is slow to extend s 2/2 flexor tone. Pt.  Eval: Pt. is able to is unable to actively perform full digit extension Goal status:  Ongoing   ASSESSMENT:   CLINICAL IMPRESSION:  Pt. Reports she had an appointment with Dr. Sherryll Burger prior to this appointment. Pt. Worked on active supination with the hand held dowel. Pt was able to perform 10 reps with decreased verbal cues to rotate the hand the correct way compared to last session. Pt. Reported that she has started doing the wooden dowel exercises at home now. Pt. worked on supination with resistance using the EZ board with multiple handles for gross gripping and 3 point pinch. Pt. Presented with decreased tightness with L forearm supination towards the end of the session. Pt. Was able to flip a total of 20 cards using forearm pronation and supination however Pt. Dropped multiple cards throughout.  Pt continues to benefit from skilled OT services to work on improving L shoulder, elbow, and wrist strength, improving motor control and coordination in order to prepare the left upper extremity and hand for functional reaching and engaging the upper left extremity when performing hair  care, buttoning, holding flowers, cutting food, and manipulating utensils to set the table as well as adaptive strategies during daily IADL care.     PERFORMANCE DEFICITS in functional skills including ADLs, IADLs, coordination, proprioception, ROM, strength, FMC, and GMC, cognitive skills including memory, and psychosocial skills including coping strategies, environmental adaptation, interpersonal interactions, and routines and behaviors.    IMPAIRMENTS are limiting patient from ADLs, IADLs, education, leisure, and social participation.    COMORBIDITIES may have co-morbidities  that affects occupational performance. Patient will benefit from skilled OT to address above impairments and improve overall function.   MODIFICATION OR ASSISTANCE TO COMPLETE EVALUATION: Min-Moderate modification of tasks or assist with assess necessary to complete an evaluation.   OT OCCUPATIONAL PROFILE AND HISTORY: Detailed assessment: Review of records and additional review of physical, cognitive, psychosocial history related to current functional performance.   CLINICAL DECISION MAKING: Moderate - several treatment options, min-mod task modification necessary   REHAB POTENTIAL: Good   EVALUATION COMPLEXITY: Moderate      PLAN: OT FREQUENCY: 2x/week   OT DURATION: 12 weeks   PLANNED INTERVENTIONS: self care/ADL training, therapeutic exercise, therapeutic activity, neuromuscular re-education, manual therapy, passive range of motion, functional mobility training, electrical stimulation, and paraffin   RECOMMENDED OTHER SERVICES: PT   CONSULTED AND AGREED WITH PLAN OF CARE: Patient and family member/caregiver   PLAN FOR NEXT SESSION: Initiate OT treatment  Herma Carson, OTS 4:35 PM 06/16/2023  This entire session was performed under the direct supervision and direction of a licensed therapist. I have personally read, edited, and approve of the note as written.  Olegario Messier, MS, OTR/L    06/17/2023

## 2023-06-18 ENCOUNTER — Ambulatory Visit: Payer: Medicare HMO | Admitting: Occupational Therapy

## 2023-06-18 ENCOUNTER — Ambulatory Visit: Payer: Medicare HMO

## 2023-06-18 DIAGNOSIS — M6281 Muscle weakness (generalized): Secondary | ICD-10-CM

## 2023-06-18 DIAGNOSIS — R531 Weakness: Secondary | ICD-10-CM | POA: Diagnosis not present

## 2023-06-18 DIAGNOSIS — R2681 Unsteadiness on feet: Secondary | ICD-10-CM

## 2023-06-18 DIAGNOSIS — R278 Other lack of coordination: Secondary | ICD-10-CM

## 2023-06-18 DIAGNOSIS — R262 Difficulty in walking, not elsewhere classified: Secondary | ICD-10-CM | POA: Diagnosis not present

## 2023-06-18 NOTE — Therapy (Addendum)
Occupational Therapy Neuro Treatment Note   Patient Name: [name] MRN: [id number] DOB:04/08/1941, 82 y.o., female Today's Date: 06/18/2023  PCP: Dr. Judithann Sheen REFERRING PROVIDER:  Dr. Judithann Sheen  END OF SESSION:  PLAN:   OT End of Session - 06/18/23 1751     Visit Number 45    Date for OT Re-Evaluation 08/20/23    Authorization Time Period Progress report period starting 12/16/2022    OT Start Time 1615    OT Stop Time 1700    OT Time Calculation (min) 45 min    Activity Tolerance Patient tolerated treatment well    Behavior During Therapy WFL for tasks assessed/performed                        Past Medical History:  Diagnosis Date   Anemia    Arthritis    Asthma    uses inhaler just prior to surgery to avoid attack   Back pain    from previous injury   Complication of anesthesia    has woken  up during 2 different surgery   Depression    no current issue/treatment; situation   Gallstones    GERD (gastroesophageal reflux disease)    Hiatal hernia    patient does NOT have nerve/muscle disease   History of kidney stones    HLD (hyperlipidemia)    HTN (hypertension)    Hypothyroidism    Kidney stones    Knee pain    Nausea and vomiting 10/15/2022   Non-diabetic pancreatic hormone dysfunction years   pt. 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   Pancreatitis    Pneumonia    Seizures (HCC)    caused by dye injected during a procedure   Shortness of breath    with exertion   Sinus problem    frequent infections/congestion   Stroke (HCC) 2021   reports having CVA in 2021 and having mini strokes before that   Thyroid disease    Past Surgical History:  Procedure Laterality Date   ABDOMINAL HYSTERECTOMY     APPENDECTOMY     CARPAL TUNNEL RELEASE  10+ years ago   bilateral   EYE SURGERY  3 yrs ago   bilateral cataracts   FOOT OSTEOTOMY  6 weeks ago   Left foot: great, 2nd & 3rd   FOOT OSTEOTOMY  5 years ago   Right great toe   HAND SURGERY  Bilateral 2011-most recent   multiple hand surgeries, 2 on left, 3 on right   KNEE ARTHROPLASTY Right 04/28/2022   Procedure: COMPUTER ASSISTED TOTAL KNEE ARTHROPLASTY;  Surgeon: Donato Heinz, MD;  Location: [facility];  Service: Orthopedics;  Laterality: Right;   LOOP RECORDER INSERTION N/A 05/16/2020   Procedure: LOOP RECORDER INSERTION;  Surgeon: Marcina Millard, MD;  Location: [facility];  Service: Cardiovascular;  Laterality: N/A;   NASAL SINUS SURGERY  most recent 7-8 yrs ago   7 sinus surgeries    TRIGGER FINGER RELEASE  11/19/2011   Procedure: RELEASE TRIGGER FINGER/A-1 PULLEY;  Surgeon: Nicki Reaper, MD;  Location: [facility];  Service: Orthopedics;  Laterality: Right;  release a-1 pulley right index finger and cyst removal   WRIST GANGLION EXCISION  1980's   right   Patient Active Problem List   Diagnosis Date Noted   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 11/27/2022   Hypomagnesemia 11/27/2022   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   HLD (hyperlipidemia) 11/23/2022  Asthma 11/23/2022   Depression with anxiety 11/23/2022   Chronic diastolic CHF (congestive heart failure) (HCC) 11/23/2022   Normocytic anemia 11/23/2022   Aspiration pneumonia (HCC) 11/23/2022   AKI (acute kidney injury) (HCC) 11/23/2022   Abdominal pain 11/23/2022   Mesenteric mass 11/23/2022   Coffee ground emesis 11/23/2022   Nausea and vomiting 10/15/2022   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   Fatigue 09/17/2022   Left hemiparesis (HCC) 09/17/2022   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HCC) 08/22/2022   GERD (gastroesophageal reflux disease) 08/21/2022   Agitation 08/20/2022   Acute left-sided weakness 08/20/2022   Expressive aphasia    Stroke (HCC) 08/19/2022   Leukocytosis 08/19/2022   History of urticaria 04/28/2022   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   Primary osteoarthritis of left knee 02/24/2022   Primary osteoarthritis of right knee  02/24/2022   Lumbar spondylolysis 04/12/2020   History of CVA (cerebrovascular accident) 03/26/2020   Low back pain radiating to right lower extremity 03/21/2020   B12 deficiency 03/06/2020   Positive anti-CCP test 12/21/2019   Arthralgia 12/13/2019   Dermatitis 12/13/2019   Rheumatoid factor positive 12/13/2019   Essential hypertension 12/11/2018   Palpitations 12/11/2018   Acquired hypothyroidism 11/10/2018   Arthritis of knee 09/17/2016   Anxiety 11/22/2014   Asthma without status asthmaticus 11/22/2014   Benign neoplasm of colon, unspecified 11/22/2014   Environmental allergies 11/22/2014   Hypertriglyceridemia 11/22/2014   Hypokalemia 11/22/2014   Personal history of disease of skin and subcutaneous tissue 11/22/2014   REFERRING DIAG: CVA   THERAPY DIAG:  Muscle weakness (generalized)   Other lack of coordination   Rationale for Evaluation and Treatment Rehabilitation   SUBJECTIVE:   Pt. reports that she is doing well  today. SUBJECTIVE STATEMENT:   Pt accompanied by: significant other   PERTINENT HISTORY: Patient is an 82 year-old female who was admitted to [facility] on 11/23/2022 with a cerebellar CVA. 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 Pt. Was admitted to Inpatient Rehab from 12/21-1/02/2023. 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  Patient underwent inpatient rehabilitation for 2 weeks.  Patient was assessed and was scheduled for a knee replacement on 08/29/2022 however had to cancel it due to having had a CVA.  Patient had a recent fall 2 days after discharging from inpatient rehab. Past Medical History includes: Knee replacement, essential HTN, hypokalemia, leukocytosis, seizures, positive anti-- CCP test, anxiety disorder, mini strokes.  Patient had shingles with left eye nerve pain s/p 1 year ago.    PRECAUTIONS: Fall   WEIGHT BEARING RESTRICTIONS No   PAIN:  Are you  having pain? No   FALLS: Has patient fallen in last 6 months? Yes. Number of falls 1   LIVING ENVIRONMENT: Lives with: Lives with Spouse Lives in: House/apartment Stairs: 2 storey home, resides on the first floor.  External: 2 stairs front no rails, and 6 in back with rails Has following equipment at home: Single point cane, Walker - 2 wheeled, Environmental consultant - 4 wheeled, Shower bench, and bed side commode   PLOF: Independent   PATIENT GOALS  To Regain the use of her left arm   OBJECTIVE:    HAND DOMINANCE: Right   ADLs: Overall ADLs: Husband assists pt. as needed Transfers/ambulation related to ADLs:Pt. Uses a 3 wheeled walker with Husband assist. Eating: Pt. Is independent with the right hand. Pt. has difficulty cutting food. Grooming: Pt. is using her right hand, however has difficulty sustaining her LUE  in elevation to assist with haircare. UB Dressing: Pt. Is independent donning a pullover shirt, and button down shirt. Has difficulty with buttoning, LB Dressing:  Independent donning pants, and socks. Difficulty tying shoes. Toileting: Independent Bathing: Pt. Is able to engage her right hand. Tub Shower transfers: Supervision Equipment: See above for equipment     IADLs: Shopping:  Has not had the opportunity for grocery shopping yet Light housekeeping: Husband is assisting with light house keeping Meal Prep:  Dependent Community mobility: Relies of family/friends Medication management: Husband assisting with weekly pillbox set-up, and administering medication. Financial management: TBD Handwriting: 75% legible   MOBILITY STATUS: Hx of falls   POSTURE COMMENTS:  No Significant postural limitations Sitting balance: supported sitting balance WFL   ACTIVITY TOLERANCE: Activity tolerance:  Fatigues in greater than 30 min.    FUNCTIONAL OUTCOME MEASURES: FOTO: 57  05/28/2023  FOTO:    UPPER EXTREMITY ROM      Active ROM Right Eval: [facility] Left eval Left  01/22/23 Left   03/03/23 Left  04/14/23 Left  05/28/2023  Shoulder flexion   132 100 108 108 108  Shoulder abduction   80 85 85 85 85  Shoulder adduction          Shoulder extension          Shoulder internal rotation          Shoulder external rotation          Elbow flexion   140 140 WFL WFL WFL  Elbow extension   WNL WNL [facility] WFL WFL  Wrist flexion   65 68     Wrist extension   -10 20 24  32 32  Wrist ulnar deviation     12 10 14 14   Wrist radial deviation     8 14 14 16   Wrist pronation          Wrist supination          (Blank rows = not tested)   Left digit flexion to [facility]: 2nd: 0cm, 3rd: 0cm, 4th: 0cm, 5th: 0cm   Limited Left full 2nd digit extension     UPPER EXTREMITY MMT:      MMT Right Eval: 4+/5 overall Left Eval Left 01/22/23 Left 03/03/23 Left  04/14/2023 Left 05/28/2023  Shoulder flexion   3/5 3-/5 3-/5 3-/5 3-/5  Shoulder abduction   3-/5 3-/5 3-/5 3-/5 3-/5  Shoulder adduction          Shoulder extension          Shoulder internal rotation          Shoulder external rotation          Middle trapezius          Lower trapezius          Elbow flexion   3+/5 4/5 N/A 4/5 4/5  Elbow extension   3+/5 4/4 N/A 4/5 4/5  Wrist flexion          Wrist extension   2-/5 3-/5 3-/5 3/5 3+/5  Wrist ulnar deviation          Wrist radial deviation          Wrist pronation          Wrist supination          (Blank rows = not tested)   HAND FUNCTION: Grip strength: Right: 26#, Left: 10# Pinch strength: Right 8#, Left: 3#, 3 Pt. Pinch strength: Right: 9#, L: 2#  01/22/2023 Grip  strength: Right: 26#, Left: 12# Pinch strength:  Pinch meter used at the initial eval has been sent out for recalibration   03/03/2023 Grip strength: Right: 26#, Left: 13# Pinch strength:  Pinch meter used at the initial eval has been sent out for recalibration  04/14/2023: Grip strength: Right: 26#, Left: 13# Pinch strength:   Right 8#, Left: 4#, 3 Pt. Pinch strength: Right: 9#, L: 3#    05/28/2023: Grip  strength: Right: 26#, Left: 13# Pinch strength:   Right 8#, Left: 2#, 3 Pt. Pinch strength: Right: 9#, L: 4#     COORDINATION: Right: 22 sec., Left: <5 min. To place 7 pegs with increased compensation proximally in the trunk, and through reflexive associated reactions.  01/22/23 Right: 22 sec., Left: 3 min. & 4 sec.  03/03/23 Right: 22 sec., Left: 1 min. & 39 sec.  04/14/23   TBD  05/28/23 Right: 22 sec., Left:  5 min.      SENSATION: Light touch: WFL, proprioceptive awareness: Intact   EDEMA: N/A   MUSCLE TONE: LUE: Hypotonic   COGNITION: Overall cognitive status: WFL for tasks assessed. Pt. Is impulsive at times.   VISION: Subjective report: Pt. report having shingles affecting left eye  s/p 1 year. Has nerve pain Baseline vision: Wears glasses for reading only Visual history:  updated see clinical impression   VISION ASSESSMENT:    WFL for tasks performed   PERCEPTION: Intact   PRAXIS: Impaired: Motor planning   OBSERVATIONS:  Pt. more alert, and engaging since prior to the most recent hospitalization.     TODAY'S TREATMENT:    Therapeutic Ex.   Pt. Worked on BUE strengthening, and reciprocal motion using the UBE while seated for 8 min. with no resistance. Constant monitoring was provided. There. Ex was performed 2/2 tightness, and to prepare the LUE for performing tasks at the tabletop.  Neuromuscular re-education:   Pt. performed left hand FMC tasks grasping 1/2" small circular tipped pegs, and placing them into a small pegboard. Pt. worked on lateral grasping large thick tweezers to remove the pegs from the pegboard. The task was modified, and Pt. Utilized a 3pt. Grasp with red resistive clips to remove the pegs. Pt. Worked on combinations of movements by adding isolated forearm supination int the task while securely holding the peg in the clips.         PATIENT EDUCATION: Education details: left hand digit extension Person educated: Patient and  Spouse Education method: Medical illustrator Education comprehension: verbalized understanding, returned demonstration, and needs further education     HOME EXERCISE PROGRAM:    Reviewed activities at home to promote isolated 2nd digit extension.    GOALS: Goals reviewed with patient? Yes   SHORT TERM GOALS: Target date:  07/09/2023         1. Patient will be independent with home exercise program for the left upper extremity Baseline: 05/28/2023: Pt. Requires assist 04/09/2023: Pt. Continues to consistently attempt to engage her hand at hand 02/01/2023: Pt. Consistently attempts to perform HEPs independently. No current home exercise program Goal status: Ongoing     LONG TERM GOALS: Target date:  08/20/2023     1.  Patient will improve left shoulder strength by 2 mm grades to be able to sustain UE's in elevation long enough to wash her hair.  Baseline: 05/28/2023: Left shoulder flexion: 3-/5, abduction: 3-/5. Pt. Has an old left shoulder injury limiting progression with left shoulder strength 05/26/2023: Pt. continues to present with limitiations in  sustaining LUE elevation long enough to perform hair care thoroughly 04/09/2023: Pt. is limited with sustaining LUE elevation long enough to perform hair care thoroughly. 03/03/2023: Left shoulder flexion: 3-/5, abduction: 3-/5 01/22/2023: Left shoulder flexion: 3-/5, abduction: 3-/5 Eval: Left shoulder flexion: 3/5, abduction: 3-/5 Goal status: Not met, Deferred   2.  Patient will improve left shoulder active abduction to be able to comb her hair Baseline: 05/28/2023: left shoulder flexion: 108 abduction: 85 Pt. Has an old left shoulder injury limiting progression with left shoulder ROM 05/26/2023:  5/10 left shoulder pain with ROM limits using it functionally during hair care. 04/09/2023: Pt. Presents with difficulty abducting her left shoulder enough to thoroughly complete haircare. 03/03/2023: Shoulder abduction: 85 01/22/2023: Shoulder  abduction: 85 Eval: Left shoulder abduction is 80(108) Goal status: Not met, Deferred   3.  Patient will independently button her shirt with modified independence. Baseline: 05/28/2023: pt. Continues to work towards progressing with buttoning 05/26/2023:  Pt. Continues to progress towards buttoning. 04/09/2023: Pt. Continues to progress towards buttoning. 03/03/2023: Pt. Continues to have difficulty with buttoning. 01/22/2023: Pt. continues to have difficulty. Eval: Patient has difficulty.  Goal status: Ongoing   4.  Patient well improve left grip strength in preparation for securely holding flowers. Baseline: 05/28/2023: Right: 26#, Left: 13#  pt. Presents with difficulty securely holding flowers in her left hand. 05/26/2023: TBD 04/09/2023: Pt. Is able to hold, and hike pants with the left hand, continues to have difficulty with securely holding flowers.   03/03/2023: left grip strength: 13# 01/22/2023: Left: 12# Eval: Pt. Is unable to securely hold flowers. Goal status: Ongoing   5.  Pt. will independently recall adaptive  strategies for performing ADL tasks including: flossing teeth, donning bra, applying makeup. Baseline: 05/28/2023: Pt. Needs continued education about adaptive strategies.05/26/2023: Continue 04/09/2023: Continue3/26/2024: Continue 01/22/2023: Pt. continues to benefit from education about adaptive strategies during ADLs, and IADLs. Eval: Pt. to be provided with adaptive strategies. Goal status: Ongoing   6.  Pt. will improve FOTO score by 2 points to reflect patient perceived performance improvement assessment specific ADLs  and IADLs Baseline: 1/61/0960: FOTO score 59 05/26/2023: TBD 5/02/204:  TBD 03/03/23: FOTO 62 Eval: 57 Goal status: Ongoing  7.  Pt. will improve left hand coordination skills in order to be able to handle, and sort utensils in a drawer.     Baseline: 05/28/2023: Left:  5 min. 05/26/2023 Pt. Continues to present with difficulty handling and sorting utensils. 04/14/2023: 56  04/09/2023: TBD 03/03/23: 1 min. & 39 01/22/2022: Left: 3 min. & 4 sec. Eval: Pt. has difficulty sorting, and placing utensils with the left hand. Left FMC : >5 min. For 7 pegs on the 9 hole peg test.    Goal Status:  Ongoing  8. Pt. will improve active left 2nd digit extension to be able able to isolate her 2nd digit in preparation for pressing/pushing buttons on appliances, phones, or remotes. Baseline: 05/28/2023: Pt. Continues to improve with left 2nd digit extension 05/26/2023: Improving with left 2nd digit extension. 04/09/2023: Pt. Is progressing with isolating left 2nd digit extension, however continues to present with limited increased flexor tone. 03/03/23: Pt. Continues to work on improving consistency with 2nd digit extension to press the remote. 01/22/2023: is able to perform full digit extension, although 2nd digit is slow to extend s 2/2 flexor tone. Pt.  Eval: Pt. is able to is unable to actively perform full digit extension Goal status:  Ongoing   ASSESSMENT:   CLINICAL IMPRESSION:  Pt. required cues, and assist for hand placement, and lateral pinch position on the tweezers, as well as 3pt. Pinch position red resistive clip. Pt. was initially unable to sustain a lateral grasp on the large resistive tweezers when removing the small pegs, however the task was modified to using a red resistive clip with a 3pt. pinch grasp for success. Pt. is making progress with combining supination into the movements, and is able to consistently perform isolated supination needed to turn her forearm, and hand over enough to look at items in her grasp, and palm. Pt. Was able to perform supination without compensating proximally. Pt. Dropped multiple small pegs from her finger tips when attempting to grasp them without tweezers. Pt continues to benefit from skilled OT services to work on improving L shoulder, elbow, and wrist strength, improving motor control and coordination in order to prepare the left upper  extremity and hand for functional reaching and engaging the upper left extremity when performing hair care, buttoning, holding flowers, cutting food, and manipulating utensils to set the table as well as adaptive strategies during daily IADL care.     PERFORMANCE DEFICITS in functional skills including ADLs, IADLs, coordination, proprioception, ROM, strength, FMC, and GMC, cognitive skills including memory, and psychosocial skills including coping strategies, environmental adaptation, interpersonal interactions, and routines and behaviors.    IMPAIRMENTS are limiting patient from ADLs, IADLs, education, leisure, and social participation.    COMORBIDITIES may have co-morbidities  that affects occupational performance. Patient will benefit from skilled OT to address above impairments and improve overall function.   MODIFICATION OR ASSISTANCE TO COMPLETE EVALUATION: Min-Moderate modification of tasks or assist with assess necessary to complete an evaluation.   OT OCCUPATIONAL PROFILE AND HISTORY: Detailed assessment: Review of records and additional review of physical, cognitive, psychosocial history related to current functional performance.   CLINICAL DECISION MAKING: Moderate - several treatment options, min-mod task modification necessary   REHAB POTENTIAL: Good   EVALUATION COMPLEXITY: Moderate      PLAN: OT FREQUENCY: 2x/week   OT DURATION: 12 weeks   PLANNED INTERVENTIONS: self care/ADL training, therapeutic exercise, therapeutic activity, neuromuscular re-education, manual therapy, passive range of motion, functional mobility training, electrical stimulation, and paraffin   RECOMMENDED OTHER SERVICES: PT   CONSULTED AND AGREED WITH PLAN OF CARE: Patient and family member/caregiver   PLAN FOR NEXT SESSION: Initiate OT treatment    Olegario Messier, MS, OTR/L   06/18/2023

## 2023-06-18 NOTE — Therapy (Signed)
OUTPATIENT PHYSICAL THERAPY NEURO TREATMENT   Patient Name: [name] MRN: [id number] DOB:[birth year], 82 y.o., female Today's Date: 06/18/2023   PCP: Aram Beecham, D MD REFERRING PROVIDER: Jacquelynn Cree PA   END OF SESSION:  PT End of Session - 06/18/23 1536     Visit Number 9    Number of Visits 24    Date for PT Re-Evaluation 07/07/23    Authorization Type 3/10 eval 04/14/23    Progress Note Due on Visit 10    PT Start Time 1451    PT Stop Time 1530    PT Time Calculation (min) 39 min    Equipment Utilized During Treatment Gait belt    Activity Tolerance Patient tolerated treatment well;Patient limited by pain    Behavior During Therapy WFL for tasks assessed/performed                 Past Medical History:  Diagnosis Date   Anemia    Arthritis    Asthma    uses inhaler just prior to surgery to avoid attack   Back pain    from previous injury   Complication of anesthesia    has woken  up during 2 different surgery   Depression    no current issue/treatment; situation   Gallstones    GERD (gastroesophageal reflux disease)    Hiatal hernia    patient does NOT have nerve/muscle disease   History of kidney stones    HLD (hyperlipidemia)    HTN (hypertension)    Hypothyroidism    Kidney stones    Knee pain    Nausea and vomiting 10/15/2022   Non-diabetic pancreatic hormone dysfunction years   pt. 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   Pancreatitis    Pneumonia    Seizures (HCC)    caused by dye injected during a procedure   Shortness of breath    with exertion   Sinus problem    frequent infections/congestion   Stroke (HCC) 2021   reports having CVA in 2021 and having mini strokes before that   Thyroid disease    Past Surgical History:  Procedure Laterality Date   ABDOMINAL HYSTERECTOMY     APPENDECTOMY     CARPAL TUNNEL RELEASE  10+ years ago   bilateral   EYE SURGERY  3 yrs ago   bilateral cataracts   FOOT OSTEOTOMY  6 weeks ago    Left foot: great, 2nd & 3rd   FOOT OSTEOTOMY  5 years ago   Right great toe   HAND SURGERY Bilateral 2011-most recent   multiple hand surgeries, 2 on left, 3 on right   KNEE ARTHROPLASTY Right 04/28/2022   Procedure: COMPUTER ASSISTED TOTAL KNEE ARTHROPLASTY;  Surgeon: Donato Heinz, MD;  Location: [facility];  Service: Orthopedics;  Laterality: Right;   LOOP RECORDER INSERTION N/A 05/16/2020   Procedure: LOOP RECORDER INSERTION;  Surgeon: Marcina Millard, MD;  Location: [facility];  Service: Cardiovascular;  Laterality: N/A;   NASAL SINUS SURGERY  most recent 7-8 yrs ago   7 sinus surgeries    TRIGGER FINGER RELEASE  11/19/2011   Procedure: RELEASE TRIGGER FINGER/A-1 PULLEY;  Surgeon: Nicki Reaper, MD;  Location: [facility];  Service: Orthopedics;  Laterality: Right;  release a-1 pulley right index finger and cyst removal   WRIST GANGLION EXCISION  1980's   right   Patient Active Problem List   Diagnosis Date Noted   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 11/27/2022   Hypomagnesemia 11/27/2022   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   HLD (hyperlipidemia) 11/23/2022   Asthma 11/23/2022   Depression with anxiety 11/23/2022   Chronic diastolic CHF (congestive heart failure) (HCC) 11/23/2022   Normocytic anemia 11/23/2022   Aspiration pneumonia (HCC) 11/23/2022   AKI (acute kidney injury) (HCC) 11/23/2022   Abdominal pain 11/23/2022   Mesenteric mass 11/23/2022   Coffee ground emesis 11/23/2022   Nausea and vomiting 10/15/2022   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   Fatigue 09/17/2022   Left hemiparesis (HCC) 09/17/2022   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HCC) 08/22/2022   GERD (gastroesophageal reflux disease) 08/21/2022   Agitation 08/20/2022   Acute left-sided weakness 08/20/2022   Expressive aphasia    Stroke (HCC) 08/19/2022   Leukocytosis 08/19/2022   History of urticaria 04/28/2022   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    Primary osteoarthritis of left knee 02/24/2022   Primary osteoarthritis of right knee 02/24/2022   Lumbar spondylolysis 04/12/2020   History of CVA (cerebrovascular accident) 03/26/2020   Low back pain radiating to right lower extremity 03/21/2020   B12 deficiency 03/06/2020   Positive anti-CCP test 12/21/2019   Arthralgia 12/13/2019   Dermatitis 12/13/2019   Rheumatoid factor positive 12/13/2019   Essential hypertension 12/11/2018   Palpitations 12/11/2018   Acquired hypothyroidism 11/10/2018   Arthritis of knee 09/17/2016   Anxiety 11/22/2014   Asthma without status asthmaticus 11/22/2014   Benign neoplasm of colon, unspecified 11/22/2014   Environmental allergies 11/22/2014   Hypertriglyceridemia 11/22/2014   Hypokalemia 11/22/2014   Personal history of disease of skin and subcutaneous tissue 11/22/2014    ONSET DATE: 11/23/22  REFERRING DIAG: TIA  THERAPY DIAG:  Muscle weakness (generalized)  Unsteadiness on feet  Difficulty in walking, not elsewhere classified  Other lack of coordination  Rationale for Evaluation and Treatment: Rehabilitation  SUBJECTIVE:                                                                                                                                                                                             SUBJECTIVE STATEMENT: Pt reports one fall from the bed when her husband tripped when helping her transfer.  Pt also with cat scratch on L lower leg as of this morning. No other updates or current concerns.   Pt accompanied by: significant other  PERTINENT HISTORY: Patient is an 82 year-old female who was admitted to [facility] on 11/23/2022 with a cerebellar CVA. 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 Pt. Was admitted to Inpatient Rehab from 12/21-1/02/2023. Pt was previously diagnosed with a right thalamic CVA on 08/18/2022 with  left-sided weakness.  Patient underwent inpatient rehabilitation  for 2 weeks.  Patient was assessed and was scheduled for a knee replacement on 08/29/2022 however had to cancel it due to having had a CVA.   PMH includes: anemia, arthritis, back pain, depression, hernia, GERD, HLD, HTN, hypothyroidism, knee pain, pancreatic hormone dysfunction, pneumonia, seizures, stroke, thyroid disease.  Patient has a donjoy brace for L knee. Walking with hand hold help with husband.   PAIN:  Are you having pain? Yes: NPRS scale: 0/10 Pain location: L knee Pain description: aching, gives out Aggravating factors: climbing into pain, moving it, walking around house Relieving factors: rest   PRECAUTIONS: Fall, has latex allergy  WEIGHT BEARING RESTRICTIONS: No  FALLS: Has patient fallen in last 6 months? Yes. Number of falls 4 falls   LIVING ENVIRONMENT: Lives with: lives with their family and lives with their spouse Lives in: House/apartment Stairs: Yes: Internal: flight steps;   and External: 2 steps; on right going up and on left going up Has following equipment at home: Single point cane, Walker - 2 wheeled, Shower bench, and bed side commode  PLOF: Independent  PATIENT GOALS: to have less pain and move better  OBJECTIVE:   DIAGNOSTIC FINDINGS:  Disc levels:   C2-3: No significant stenosis.   C3-4: A rightward disc osteophyte complex effaces the ventral CSF. Severe left and moderate right foraminal stenosis is present.   C4-5: A broad-based disc osteophyte complex effaces the ventral CSF. The canal is narrowed 7 mm. Severe left and moderate right foraminal stenosis is present.   C5-6: Chronic loss of disc height is present. Broad-based disc osteophyte complex is asymmetric to the right. Partial effacement of the ventral CSF is present. Moderate foraminal stenosis is worse right than left.   C6-7: A broad-based disc osteophyte complex is present. This partially effaces the ventral CSF. Moderate foraminal narrowing is worse right than left.   C7-T1:  Uncovertebral spurring is present bilaterally. Mild bilateral foraminal stenosis is present.   IMPRESSION: 1. Multilevel spondylosis of the cervical spine as described. 2. 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 3. 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 4. No other significant cord signal abnormality to suggest ischemic changes to the cord. 5. Severe left and moderate right foraminal stenosis at C3-4 and C4-5. 6. Moderate foraminal stenosis bilaterally at C5-6 and C6-7 is worse on the right. 7. Mild bilateral foraminal narrowing at C7-T1. 8. Prominent soft tissue pannus at C1-2 effaces the ventral CSF. 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  IMPRESSION: 1. 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 2. 9 mm right occipital pole infarct. 3. Punctate white matter infarct in the left occipital lobe. 4. Additional punctate cortical infarct in the medial right occipital lobe more superiorly. 5. Expected evolution of previous right posterior frontal lobe white matter infarct. 6. Stable atrophy and white matter disease likely reflects the sequela of chronic microvascular ischemia.  COGNITION: Overall cognitive status:  two strokes   SENSATION: WFL  COORDINATION: Heel slide test limited by LLE pain  POSTURE: rounded shoulders, forward head, and flexed trunk   LOWER EXTREMITY ROM:     Bowing of L knee  LOWER EXTREMITY MMT:    MMT Right Eval Left Eval  Hip flexion 3 3*  Hip extension    Hip abduction 3 3*  Hip adduction 3 *  Hip internal  rotation    Hip external rotation    Knee flexion 3 3*  Knee extension 3 3*  Ankle dorsiflexion 4 4  Ankle plantarflexion 4 4  (Blank rows = not tested) * pain  BED MOBILITY:  Per patient she has difficulty, occasionally needs  help  TRANSFERS: Assistive device utilized: None  Sit to stand: CGA and Min A Stand to sit: Min A Chair to chair: CGA    GAIT: Gait pattern: step to pattern, decreased stance time- Left, genu recurvatum- Left, and shuffling Distance walked: 10 ft Assistive device utilized: None Level of assistance: CGA Comments: Patient is very painful with ambulation, unable to ambulate longer duration   FUNCTIONAL TESTS:  5 times sit to stand: unable to tolerate 10 meter walk test: unable to tolerate  PATIENT SURVEYS:  FOTO 47  TODAY'S TREATMENT:                                                                                                                              DATE: 06/18/23   TherEx: Min A transferring pt from transport WC to mat table for stability at start of session  Supine: on large mat table  Supine march with alt UE punch 2x16. Coordination is challenging and LUE punch is difficult for pt, but she exhibits improvement with reps   SLR 1x10 each LE. Additional 1x10 LLE Pt is pain limited on R   Seated LAQ 3x15 each LE  STS from increased height 2x5 with close CGA  Amb. Short distance from mat table >chair approx 4 ft, and nustep<>chair approx 4 ft for eac,h. PT provides min assist throughout, pt holding onto therapist  Nustep LE mm and cardiorespiratory endurance training: Lvl 1 x 6 min 10 sec.  PT provides min assist for LE/UE postioning, and assists throughout intervention with LUE placement. Pt reported initial pain in knee/LUE, but reports pain improved/went away with intervention    PATIENT EDUCATION: Education details: Pt educated throughout session about proper posture and technique with exercises. Improved exercise technique, movement at target joints, use of target muscles after min to mod verbal, visual, tactile cues.  Person educated: Patient and Spouse Education method: Explanation, Demonstration, Tactile cues, and Verbal cues Education comprehension:  verbalized understanding, returned demonstration, verbal cues required, and tactile cues required  HOME EXERCISE PROGRAM: continue HEP as previously given Access Code: [email or web address] URL: [email or web address] Date: 04/14/2023 Prepared by: Precious Bard  Exercises - Seated Single Leg Hip Abduction  - 1 x daily - 7 x weekly - 2 sets - 10 reps - 5 hold - Seated Heel Slide  - 1 x daily - 7 x weekly - 2 sets - 10 reps - 5 hold - Seated Heel Toe Raises  - 1 x daily - 7 x weekly - 2 sets - 10 reps - 5 hold  GOALS: Goals reviewed with patient? Yes  SHORT TERM GOALS: Target date: 05/12/2023    Patient will be independent in  home exercise program to improve strength/mobility for better functional independence with ADLs. Baseline: 5/7:  Goal status: INITIAL    LONG TERM GOALS: Target date: 07/07/2023    Patient will increase FOTO score to equal to or greater than   57%  to demonstrate statistically significant improvement in mobility and quality of life.  Baseline:  47% Goal status: INITIAL  2.  Patient (> 35 years old) will complete five times sit to stand test in < 15 seconds indicating an increased LE strength and improved balance. Baseline: 5/7: unable to tolerate test Goal status: INITIAL  3.   Patient will deny any falls over past 4 weeks to demonstrate improved safety awareness at home and work.  Baseline: 5/7: multiple falls Goal status: INITIAL  4.  Patient will increase BLE gross strength to 4+/5 as to improve functional strength for independent gait, increased standing tolerance and increased ADL ability Baseline: 5/7: see above  Goal status: INITIAL  5.  Patient will be able to perform household work/ chores without increase in symptoms. Baseline: 5/7: unable to perform  Goal status: INITIAL    ASSESSMENT:  CLINICAL IMPRESSION:  Pt presents with improved energy levels today. She is able to transition from mostly supine therex to seated and even initiated  nustep endurance training. Pt also exhibited ability to ambulate short distances with holding onto PT. Pt reported improvement in hip and knee pain following interventions at end of session. The patient will benefit from skilled physical therapy to reduce pain, improve mobility, improve strength and improve quality of life.    OBJECTIVE IMPAIRMENTS: Abnormal gait, cardiopulmonary status limiting activity, decreased activity tolerance, decreased balance, decreased cognition, decreased endurance, decreased knowledge of use of DME, decreased mobility, difficulty walking, decreased ROM, decreased strength, decreased safety awareness, hypomobility, increased fascial restrictions, impaired perceived functional ability, impaired flexibility, impaired UE functional use, improper body mechanics, postural dysfunction, and pain.   ACTIVITY LIMITATIONS: carrying, lifting, bending, sitting, standing, squatting, stairs, transfers, bed mobility, bathing, toileting, dressing, reach over head, hygiene/grooming, locomotion level, and caring for others  PARTICIPATION LIMITATIONS: meal prep, cleaning, laundry, medication management, personal finances, interpersonal relationship, driving, shopping, community activity, yard work, school, and church  PERSONAL FACTORS: Age, Behavior pattern, Education, Fitness, Past/current experiences, Time since onset of injury/illness/exacerbation, and 3+ comorbidities: anemia, arthritis, back pain, depression, hernia, GERD, HLD, HTN, hypothyroidism, knee pain, pancreatic hormone dysfunction, pneumonia, seizures, stroke, thyroid disease  are also affecting patient's functional outcome.   REHAB POTENTIAL: Fair    CLINICAL DECISION MAKING: Evolving/moderate complexity  EVALUATION COMPLEXITY: Moderate  PLAN:  PT FREQUENCY: 1-2x/week  PT DURATION: 12 weeks  PLANNED INTERVENTIONS: Therapeutic exercises, Therapeutic activity, Neuromuscular re-education, Balance training, Gait training,  Patient/Family education, Self Care, Joint mobilization, Joint manipulation, Stair training, Vestibular training, Canalith repositioning, Visual/preceptual remediation/compensation, Orthotic/Fit training, DME instructions, Electrical stimulation, Wheelchair mobility training, Spinal mobilization, Cryotherapy, Moist heat, Compression bandaging, scar mobilization, Splintting, Taping, Traction, Ultrasound, Manual therapy, and Re-evaluation  PLAN FOR NEXT SESSION: seated and supine strengthening of LLE, progress to standing/gait as able   Baird Kay, PT 06/18/2023, 3:57 PM

## 2023-06-23 ENCOUNTER — Ambulatory Visit: Payer: Medicare HMO | Admitting: Occupational Therapy

## 2023-06-23 ENCOUNTER — Ambulatory Visit: Payer: Medicare HMO | Admitting: Physical Therapy

## 2023-06-23 DIAGNOSIS — R262 Difficulty in walking, not elsewhere classified: Secondary | ICD-10-CM

## 2023-06-23 DIAGNOSIS — R531 Weakness: Secondary | ICD-10-CM | POA: Diagnosis not present

## 2023-06-23 DIAGNOSIS — R278 Other lack of coordination: Secondary | ICD-10-CM

## 2023-06-23 DIAGNOSIS — M6281 Muscle weakness (generalized): Secondary | ICD-10-CM | POA: Diagnosis not present

## 2023-06-23 DIAGNOSIS — R2681 Unsteadiness on feet: Secondary | ICD-10-CM | POA: Diagnosis not present

## 2023-06-23 NOTE — Therapy (Unsigned)
OUTPATIENT PHYSICAL THERAPY NEURO TREATMENT/ PHYSICAL THERAPY PROGRESS NOTE   Dates of reporting period 04/14/2023   to   06/23/2023     [name] Name: [name] MRN: [id number] DOB:30-Dec-1940, 82 y.o., female Today's Date: 06/23/2023   PCP: Aram Beecham, D MD REFERRING PROVIDER: Jacquelynn Cree PA   END OF SESSION:  PT End of Session - 06/23/23 1614     Visit Number 10    Number of Visits 24    Date for PT Re-Evaluation 07/07/23    Authorization Type 3/10 eval 04/14/23    Progress Note Due on Visit 10    PT Start Time 1620    PT Stop Time 1700    PT Time Calculation (min) 40 min    Equipment Utilized During Treatment Gait belt    Activity Tolerance [name] tolerated treatment well;[name] limited by pain    Behavior During Therapy WFL for tasks assessed/performed                 Past Medical History:  Diagnosis Date   Anemia    Arthritis    Asthma    uses inhaler just prior to surgery to avoid attack   Back pain    from previous injury   Complication of anesthesia    has woken  up during 2 different surgery   Depression    no current issue/treatment; situation   Gallstones    GERD (gastroesophageal reflux disease)    Hiatal hernia    [name] does NOT have nerve/muscle disease   History of kidney stones    HLD (hyperlipidemia)    HTN (hypertension)    Hypothyroidism    Kidney stones    Knee pain    Nausea and vomiting 10/15/2022   Non-diabetic pancreatic hormone dysfunction years   pt. 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   Pancreatitis    Pneumonia    Seizures (HCC)    caused by dye injected during a procedure   Shortness of breath    with exertion   Sinus problem    frequent infections/congestion   Stroke (HCC) 2021   reports having CVA in 2021 and having mini strokes before that   Thyroid disease    Past Surgical History:  Procedure Laterality Date   ABDOMINAL HYSTERECTOMY     APPENDECTOMY     CARPAL TUNNEL RELEASE  10+ years  ago   bilateral   EYE SURGERY  3 yrs ago   bilateral cataracts   FOOT OSTEOTOMY  6 weeks ago   Left foot: great, 2nd & 3rd   FOOT OSTEOTOMY  5 years ago   Right great toe   HAND SURGERY Bilateral 2011-most recent   multiple hand surgeries, 2 on left, 3 on right   KNEE ARTHROPLASTY Right 04/28/2022   Procedure: COMPUTER ASSISTED TOTAL KNEE ARTHROPLASTY;  Surgeon: Donato Heinz, MD;  Location: [facility];  Service: Orthopedics;  Laterality: Right;   LOOP RECORDER INSERTION N/A 05/16/2020   Procedure: LOOP RECORDER INSERTION;  Surgeon: Marcina Millard, MD;  Location: [facility];  Service: Cardiovascular;  Laterality: N/A;   NASAL SINUS SURGERY  most recent 7-8 yrs ago   7 sinus surgeries    TRIGGER FINGER RELEASE  11/19/2011   Procedure: RELEASE TRIGGER FINGER/A-1 PULLEY;  Surgeon: Nicki Reaper, MD;  Location: [facility];  Service: Orthopedics;  Laterality: Right;  release a-1 pulley right index finger and cyst removal   WRIST GANGLION EXCISION  1980's  right   [name] Active Problem List   Diagnosis Date Noted   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 11/27/2022   Hypomagnesemia 11/27/2022   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   HLD (hyperlipidemia) 11/23/2022   Asthma 11/23/2022   Depression with anxiety 11/23/2022   Chronic diastolic CHF (congestive heart failure) (HCC) 11/23/2022   Normocytic anemia 11/23/2022   Aspiration pneumonia (HCC) 11/23/2022   AKI (acute kidney injury) (HCC) 11/23/2022   Abdominal pain 11/23/2022   Mesenteric mass 11/23/2022   Coffee ground emesis 11/23/2022   Nausea and vomiting 10/15/2022   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   Fatigue 09/17/2022   Left hemiparesis (HCC) 09/17/2022   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HCC) 08/22/2022   GERD (gastroesophageal reflux disease) 08/21/2022   Agitation 08/20/2022   Acute left-sided weakness 08/20/2022   Expressive aphasia    Stroke (HCC) 08/19/2022    Leukocytosis 08/19/2022   History of urticaria 04/28/2022   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   Primary osteoarthritis of left knee 02/24/2022   Primary osteoarthritis of right knee 02/24/2022   Lumbar spondylolysis 04/12/2020   History of CVA (cerebrovascular accident) 03/26/2020   Low back pain radiating to right lower extremity 03/21/2020   B12 deficiency 03/06/2020   Positive anti-CCP test 12/21/2019   Arthralgia 12/13/2019   Dermatitis 12/13/2019   Rheumatoid factor positive 12/13/2019   Essential hypertension 12/11/2018   Palpitations 12/11/2018   Acquired hypothyroidism 11/10/2018   Arthritis of knee 09/17/2016   Anxiety 11/22/2014   Asthma without status asthmaticus 11/22/2014   Benign neoplasm of colon, unspecified 11/22/2014   Environmental allergies 11/22/2014   Hypertriglyceridemia 11/22/2014   Hypokalemia 11/22/2014   Personal history of disease of skin and subcutaneous tissue 11/22/2014    ONSET DATE: 11/23/22  REFERRING DIAG: TIA  THERAPY DIAG:  Muscle weakness (generalized)  Unsteadiness on feet  Difficulty in walking, not elsewhere classified  Other lack of coordination  Acute left-sided weakness  Rationale for Evaluation and Treatment: Rehabilitation  SUBJECTIVE:                                                                                                                                                                                             SUBJECTIVE STATEMENT: Pt reports that she is doing well. Has progressed to being able to ambulate short distances in home. Reports that she does not wear brace very often when at home   Pt accompanied by: significant other  PERTINENT HISTORY: [name] is an 63 year-old female who was admitted to [facility] on 11/23/2022 with a cerebellar CVA. 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 Pt. Was admitted  to Inpatient Rehab from 12/21-1/02/2023. Pt was previously diagnosed  with a right thalamic CVA on 08/18/2022 with left-sided weakness.  [name] underwent inpatient rehabilitation for 2 weeks.  [name] was assessed and was scheduled for a knee replacement on 08/29/2022 however had to cancel it due to having had a CVA.   PMH includes: anemia, arthritis, back pain, depression, hernia, GERD, HLD, HTN, hypothyroidism, knee pain, pancreatic hormone dysfunction, pneumonia, seizures, stroke, thyroid disease.  [name] has a donjoy brace for L knee. Walking with hand hold help with husband.   PAIN:  Are you having pain? Yes: NPRS scale: 0/10 Pain location: L knee Pain description: aching, gives out Aggravating factors: climbing into pain, moving it, walking around house Relieving factors: rest   PRECAUTIONS: Fall, has latex allergy  WEIGHT BEARING RESTRICTIONS: No  FALLS: Has [name] fallen in last 6 months? Yes. Number of falls 4 falls   LIVING ENVIRONMENT: Lives with: lives with their family and lives with their spouse Lives in: House/apartment Stairs: Yes: Internal: flight steps;   and External: 2 steps; on right going up and on left going up Has following equipment at home: Single point cane, Walker - 2 wheeled, Shower bench, and bed side commode  PLOF: Independent  [name] GOALS: to have less pain and move better  OBJECTIVE:   DIAGNOSTIC FINDINGS:  Disc levels:   C2-3: No significant stenosis.   C3-4: A rightward disc osteophyte complex effaces the ventral CSF. Severe left and moderate right foraminal stenosis is present.   C4-5: A broad-based disc osteophyte complex effaces the ventral CSF. The canal is narrowed 7 mm. Severe left and moderate right foraminal stenosis is present.   C5-6: Chronic loss of disc height is present. Broad-based disc osteophyte complex is asymmetric to the right. Partial effacement of the ventral CSF is present. Moderate foraminal stenosis is worse right than left.   C6-7: A broad-based disc osteophyte complex is  present. This partially effaces the ventral CSF. Moderate foraminal narrowing is worse right than left.   C7-T1: Uncovertebral spurring is present bilaterally. Mild bilateral foraminal stenosis is present.   IMPRESSION: 1. Multilevel spondylosis of the cervical spine as described. 2. 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 3. 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 4. No other significant cord signal abnormality to suggest ischemic changes to the cord. 5. Severe left and moderate right foraminal stenosis at C3-4 and C4-5. 6. Moderate foraminal stenosis bilaterally at C5-6 and C6-7 is worse on the right. 7. Mild bilateral foraminal narrowing at C7-T1. 8. Prominent soft tissue pannus at C1-2 effaces the ventral CSF. 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  IMPRESSION: 1. 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 2. 9 mm right occipital pole infarct. 3. Punctate white matter infarct in the left occipital lobe. 4. Additional punctate cortical infarct in the medial right occipital lobe more superiorly. 5. Expected evolution of previous right posterior frontal lobe white matter infarct. 6. Stable atrophy and white matter disease likely reflects the sequela of chronic microvascular ischemia.  COGNITION: Overall cognitive status:  two strokes   SENSATION: WFL  COORDINATION: Heel slide test limited by LLE pain  POSTURE: rounded shoulders, forward head, and flexed trunk   LOWER EXTREMITY ROM:     Bowing of L knee  LOWER EXTREMITY MMT:    MMT Right Eval Left Eval R 7/16 L 7/16  Hip  flexion 3 3* 3+ 3+  Hip extension      Hip abduction 3 3* 4- 4-  Hip adduction 3 * 4- 4-  Hip internal rotation      Hip external rotation      Knee flexion 3 3* 4- 4-  Knee extension 3 3* 4- 4-  Ankle  dorsiflexion 4 4 4 4   Ankle plantarflexion 4 4 4 4   (Blank rows = not tested) * pain  BED MOBILITY:  Per [name] she has difficulty, occasionally needs help  TRANSFERS: Assistive device utilized: None  Sit to stand: CGA and Min A Stand to sit: Min A Chair to chair: CGA    GAIT: Gait pattern: step to pattern, decreased stance time- Left, genu recurvatum- Left, and shuffling Distance walked: 10 ft Assistive device utilized: None Level of assistance: CGA Comments: [name] is very painful with ambulation, unable to ambulate longer duration   FUNCTIONAL TESTS:  5 times sit to stand: unable to tolerate 10 meter walk test: unable to tolerate  [name] SURVEYS:  FOTO 47  TODAY'S TREATMENT:                                                                                                                              DATE: 06/23/23    Goal assessment to measure progress: see below.   PT instructed pt in standing HEP: hip abduction x 8 bil, standing hip flexion x 8BLE.  Sit<>stand 2 x5 with 1 UE supported on RW. Therapeutic rest break between botus due to fatigue.   10 Meter Walk Test: [name] instructed to walk 10 meters (32.8 ft) as quickly and as safely as possible at their normal speed x2 and at a fast speed x2. Time measured from 2 meter mark to 8 meter mark to accommodate ramp-up and ramp-down.  Performed with knee brace and RW.  Average Normal speed: 0.491m/s  m/s  Cut off scores: <0.4 m/s = household Ambulator, 0.4-0.8 m/s = limited community Ambulator, >0.8 m/s = community Ambulator, >1.2 m/s = crossing a street, <1.0 = increased fall risk MCID 0.05 m/s (small), 0.13 m/s (moderate), 0.06 m/s (significant)  (ANPTA Core Set of Outcome Measures for Adults with Neurologic Conditions, 2018)  Pt performed 5 time sit<>stand (5xSTS): 17.4 sec (>15 sec indicates increased fall risk)   Throughout session. Pt performed sit<>stand transfers with supervision assist with cues for UE  position only.   No pain reported in L knee throughout session.   [name] EDUCATION: Education details: Pt educated throughout session about proper posture and technique with exercises. Improved exercise technique, movement at target joints, use of target muscles after min to mod verbal, visual, tactile cues.  Person educated: [name] and Spouse Education method: Explanation, Demonstration, Tactile cues, and Verbal cues Education comprehension: verbalized understanding, returned demonstration, verbal cues required, and tactile cues required  HOME EXERCISE PROGRAM: continue HEP as previously given  Access Code: [email or web address] URL: [email or web address] Date: 06/23/2023 Prepared by: Eliberto Ivory  Pricilla Holm  Exercises - Standing March with Counter Support  - 1 x daily - 7 x weekly - 2 sets - 10 reps - Standing Hip Abduction with Unilateral Counter Support  - 1 x daily - 7 x weekly - 2 sets - 8 reps  Access Code: [email or web address] URL: [email or web address] Date: 04/14/2023 Prepared by: Precious Bard  Exercises - Seated Single Leg Hip Abduction  - 1 x daily - 7 x weekly - 2 sets - 10 reps - 5 hold - Seated Heel Slide  - 1 x daily - 7 x weekly - 2 sets - 10 reps - 5 hold - Seated Heel Toe Raises  - 1 x daily - 7 x weekly - 2 sets - 10 reps - 5 hold  GOALS: Goals reviewed with [name]? Yes  SHORT TERM GOALS: Target date: 05/12/2023    [name] will be independent in home exercise program to improve strength/mobility for better functional independence with ADLs. Baseline: 5/7:  Goal status: INITIAL    LONG TERM GOALS: Target date: 07/07/2023    [name] will increase FOTO score to equal to or greater than   57%  to demonstrate statistically significant improvement in mobility and quality of life.  Baseline:  47%  7/16: 60%  Goal status: INITIAL  2.  [name] (> 61 years old) will complete five times sit to stand test in < 15 seconds indicating an increased LE strength and  improved balance. Baseline: 5/7: unable to tolerate test  7/16: 17.4sec  Goal status: IN PROGRESS  3.   [name] will deny any falls over past 4 weeks to demonstrate improved safety awareness at home and work.  Baseline: 5/7: multiple falls   1 fall from bed last week.  Goal status: IN PROGRESS  4.  [name] will increase BLE gross strength to 4+/5 as to improve functional strength for independent gait, increased standing tolerance and increased ADL ability Baseline: 5/7: see above  7/16: improved to grossly 4-/5 overall  Goal status: INITIAL  5.  [name] will be able to perform household work/ chores without increase in symptoms. Baseline: 5/7: unable to perform   7/16: Pt reports that she has returned to doing some housework again, but reports that she is performing from [facility].  Goal status: IN PROGRESS  6. Pt will increase gait speed to >0.8 m/s to indicate reduced fall risk and improved safety with house hold level ambulation.  Baseline: 7/16: 0.43m/s  Goal status: New      ASSESSMENT:  CLINICAL IMPRESSION:  Pt presents with improved energy levels today. She presented to therapy with LLE knee brace. No pain throughout PT session with transfers and able to performed with RW. Min cues for step width and safety in turns from PT. Pt demonstrates improved balance, strength, and tolerance to standing. [name]'s condition has the potential to improve in response to therapy. Maximum improvement is yet to be obtained. The anticipated improvement is attainable and reasonable in a generally predictable time.  The [name] will benefit from skilled physical therapy to reduce pain, improve mobility, improve strength and improve quality of life.     OBJECTIVE IMPAIRMENTS: Abnormal gait, cardiopulmonary status limiting activity, decreased activity tolerance, decreased balance, decreased cognition, decreased endurance, decreased knowledge of use of DME, decreased mobility, difficulty walking,  decreased ROM, decreased strength, decreased safety awareness, hypomobility, increased fascial restrictions, impaired perceived functional ability, impaired flexibility, impaired UE functional use, improper body mechanics, postural dysfunction, and pain.   ACTIVITY LIMITATIONS: carrying, lifting, bending, sitting,  standing, squatting, stairs, transfers, bed mobility, bathing, toileting, dressing, reach over head, hygiene/grooming, locomotion level, and caring for others  PARTICIPATION LIMITATIONS: meal prep, cleaning, laundry, medication management, personal finances, interpersonal relationship, driving, shopping, community activity, yard work, school, and church  PERSONAL FACTORS: Age, Behavior pattern, Education, Fitness, Past/current experiences, Time since onset of injury/illness/exacerbation, and 3+ comorbidities: anemia, arthritis, back pain, depression, hernia, GERD, HLD, HTN, hypothyroidism, knee pain, pancreatic hormone dysfunction, pneumonia, seizures, stroke, thyroid disease  are also affecting [name]'s functional outcome.   REHAB POTENTIAL: Fair    CLINICAL DECISION MAKING: Evolving/moderate complexity  EVALUATION COMPLEXITY: Moderate  PLAN:  PT FREQUENCY: 1-2x/week  PT DURATION: 12 weeks  PLANNED INTERVENTIONS: Therapeutic exercises, Therapeutic activity, Neuromuscular re-education, Balance training, Gait training, [name]/Family education, Self Care, Joint mobilization, Joint manipulation, Stair training, Vestibular training, Canalith repositioning, Visual/preceptual remediation/compensation, Orthotic/Fit training, DME instructions, Electrical stimulation, Wheelchair mobility training, Spinal mobilization, Cryotherapy, Moist heat, Compression bandaging, scar mobilization, Splintting, Taping, Traction, Ultrasound, Manual therapy, and Re-evaluation  PLAN FOR NEXT SESSION:  Standing balance and gait as tolerated   seated and supine strengthening of LLE,   Golden Pop,  PT 06/23/2023, 4:16 PM

## 2023-06-23 NOTE — Therapy (Unsigned)
Occupational Therapy Neuro Treatment Note   [name] Name: [name] MRN: [id number] DOB:1941-09-11, 82 y.o., female Today's Date: 06/23/2023  PCP: Dr. Judithann Sheen REFERRING PROVIDER:  Dr. Judithann Sheen  END OF SESSION:  PLAN:   OT End of Session - 06/23/23 1711     Visit Number 46    Number of Visits 72    Date for OT Re-Evaluation 08/20/23    OT Start Time 1435    OT Stop Time 1515    OT Time Calculation (min) 40 min    Equipment Utilized During Treatment Wheelchair    Activity Tolerance [name] tolerated treatment well    Behavior During Therapy WFL for tasks assessed/performed                        Past Medical History:  Diagnosis Date   Anemia    Arthritis    Asthma    uses inhaler just prior to surgery to avoid attack   Back pain    from previous injury   Complication of anesthesia    has woken  up during 2 different surgery   Depression    no current issue/treatment; situation   Gallstones    GERD (gastroesophageal reflux disease)    Hiatal hernia    [name] does NOT have nerve/muscle disease   History of kidney stones    HLD (hyperlipidemia)    HTN (hypertension)    Hypothyroidism    Kidney stones    Knee pain    Nausea and vomiting 10/15/2022   Non-diabetic pancreatic hormone dysfunction years   pt. 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   Pancreatitis    Pneumonia    Seizures (HCC)    caused by dye injected during a procedure   Shortness of breath    with exertion   Sinus problem    frequent infections/congestion   Stroke (HCC) 2021   reports having CVA in 2021 and having mini strokes before that   Thyroid disease    Past Surgical History:  Procedure Laterality Date   ABDOMINAL HYSTERECTOMY     APPENDECTOMY     CARPAL TUNNEL RELEASE  10+ years ago   bilateral   EYE SURGERY  3 yrs ago   bilateral cataracts   FOOT OSTEOTOMY  6 weeks ago   Left foot: great, 2nd & 3rd   FOOT OSTEOTOMY  5 years ago   Right great toe   HAND SURGERY  Bilateral 2011-most recent   multiple hand surgeries, 2 on left, 3 on right   KNEE ARTHROPLASTY Right 04/28/2022   Procedure: COMPUTER ASSISTED TOTAL KNEE ARTHROPLASTY;  Surgeon: Donato Heinz, MD;  Location: [facility];  Service: Orthopedics;  Laterality: Right;   LOOP RECORDER INSERTION N/A 05/16/2020   Procedure: LOOP RECORDER INSERTION;  Surgeon: Marcina Millard, MD;  Location: [facility];  Service: Cardiovascular;  Laterality: N/A;   NASAL SINUS SURGERY  most recent 7-8 yrs ago   7 sinus surgeries    TRIGGER FINGER RELEASE  11/19/2011   Procedure: RELEASE TRIGGER FINGER/A-1 PULLEY;  Surgeon: Nicki Reaper, MD;  Location: [facility];  Service: Orthopedics;  Laterality: Right;  release a-1 pulley right index finger and cyst removal   WRIST GANGLION EXCISION  1980's   right   [name] Active Problem List   Diagnosis Date Noted   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 11/27/2022   Hypomagnesemia 11/27/2022   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  HLD (hyperlipidemia) 11/23/2022   Asthma 11/23/2022   Depression with anxiety 11/23/2022   Chronic diastolic CHF (congestive heart failure) (HCC) 11/23/2022   Normocytic anemia 11/23/2022   Aspiration pneumonia (HCC) 11/23/2022   AKI (acute kidney injury) (HCC) 11/23/2022   Abdominal pain 11/23/2022   Mesenteric mass 11/23/2022   Coffee ground emesis 11/23/2022   Nausea and vomiting 10/15/2022   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   Fatigue 09/17/2022   Left hemiparesis (HCC) 09/17/2022   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HCC) 08/22/2022   GERD (gastroesophageal reflux disease) 08/21/2022   Agitation 08/20/2022   Acute left-sided weakness 08/20/2022   Expressive aphasia    Stroke (HCC) 08/19/2022   Leukocytosis 08/19/2022   History of urticaria 04/28/2022   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   Primary osteoarthritis of left knee 02/24/2022   Primary osteoarthritis of right knee  02/24/2022   Lumbar spondylolysis 04/12/2020   History of CVA (cerebrovascular accident) 03/26/2020   Low back pain radiating to right lower extremity 03/21/2020   B12 deficiency 03/06/2020   Positive anti-CCP test 12/21/2019   Arthralgia 12/13/2019   Dermatitis 12/13/2019   Rheumatoid factor positive 12/13/2019   Essential hypertension 12/11/2018   Palpitations 12/11/2018   Acquired hypothyroidism 11/10/2018   Arthritis of knee 09/17/2016   Anxiety 11/22/2014   Asthma without status asthmaticus 11/22/2014   Benign neoplasm of colon, unspecified 11/22/2014   Environmental allergies 11/22/2014   Hypertriglyceridemia 11/22/2014   Hypokalemia 11/22/2014   Personal history of disease of skin and subcutaneous tissue 11/22/2014   REFERRING DIAG: CVA   THERAPY DIAG:  Muscle weakness (generalized)   Other lack of coordination   Rationale for Evaluation and Treatment Rehabilitation   SUBJECTIVE:   Pt. reports that she is doing well today however does not want to use her R hand today due to her cat scratching her over the weekend causing her hand to be swollen. SUBJECTIVE STATEMENT:   Pt accompanied by: significant other   PERTINENT HISTORY: [name] is an 37 year-old female who was admitted to [facility] on 11/23/2022 with a cerebellar CVA. 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 Pt. Was admitted to Inpatient Rehab from 12/21-1/02/2023. 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  [name] underwent inpatient rehabilitation for 2 weeks.  [name] was assessed and was scheduled for a knee replacement on 08/29/2022 however had to cancel it due to having had a CVA.  [name] had a recent fall 2 days after discharging from inpatient rehab. Past Medical History includes: Knee replacement, essential HTN, hypokalemia, leukocytosis, seizures, positive anti-- CCP test, anxiety disorder, mini strokes.  [name] had  shingles with left eye nerve pain s/p 1 year ago.    PRECAUTIONS: Fall   WEIGHT BEARING RESTRICTIONS No   PAIN:  Are you having pain? No pain reported   FALLS: Has [name] fallen in last 6 months? Yes. Number of falls 1   LIVING ENVIRONMENT: Lives with: Lives with Spouse Lives in: House/apartment Stairs: 2 storey home, resides on the first floor.  External: 2 stairs front no rails, and 6 in back with rails Has following equipment at home: Single point cane, Walker - 2 wheeled, Environmental consultant - 4 wheeled, Shower bench, and bed side commode   PLOF: Independent   [name] GOALS  To Regain the use of her left arm   OBJECTIVE:    HAND DOMINANCE: Right   ADLs: Overall ADLs: Husband assists pt. as needed Transfers/ambulation related to ADLs:Pt. Uses a 3  wheeled walker with Husband assist. Eating: Pt. Is independent with the right hand. Pt. has difficulty cutting food. Grooming: Pt. is using her right hand, however has difficulty sustaining her LUE in elevation to assist with haircare. UB Dressing: Pt. Is independent donning a pullover shirt, and button down shirt. Has difficulty with buttoning, LB Dressing:  Independent donning pants, and socks. Difficulty tying shoes. Toileting: Independent Bathing: Pt. Is able to engage her right hand. Tub Shower transfers: Supervision Equipment: See above for equipment     IADLs: Shopping:  Has not had the opportunity for grocery shopping yet Light housekeeping: Husband is assisting with light house keeping Meal Prep:  Dependent Community mobility: Relies of family/friends Medication management: Husband assisting with weekly pillbox set-up, and administering medication. Financial management: TBD Handwriting: 75% legible   MOBILITY STATUS: Hx of falls   POSTURE COMMENTS:  No Significant postural limitations Sitting balance: supported sitting balance WFL   ACTIVITY TOLERANCE: Activity tolerance:  Fatigues in greater than 30 min.    FUNCTIONAL  OUTCOME MEASURES: FOTO: 57  05/28/2023  FOTO:    UPPER EXTREMITY ROM      Active ROM Right Eval: [facility] Left eval Left  01/22/23 Left  03/03/23 Left  04/14/23 Left  05/28/2023  Shoulder flexion   132 100 108 108 108  Shoulder abduction   80 85 85 85 85  Shoulder adduction          Shoulder extension          Shoulder internal rotation          Shoulder external rotation          Elbow flexion   140 140 WFL WFL WFL  Elbow extension   WNL WNL [facility] WFL WFL  Wrist flexion   65 68     Wrist extension   -10 20 24  32 32  Wrist ulnar deviation     12 10 14 14   Wrist radial deviation     8 14 14 16   Wrist pronation          Wrist supination          (Blank rows = not tested)   Left digit flexion to [facility]: 2nd: 0cm, 3rd: 0cm, 4th: 0cm, 5th: 0cm   Limited Left full 2nd digit extension     UPPER EXTREMITY MMT:      MMT Right Eval: 4+/5 overall Left Eval Left 01/22/23 Left 03/03/23 Left  04/14/2023 Left 05/28/2023  Shoulder flexion   3/5 3-/5 3-/5 3-/5 3-/5  Shoulder abduction   3-/5 3-/5 3-/5 3-/5 3-/5  Shoulder adduction          Shoulder extension          Shoulder internal rotation          Shoulder external rotation          Middle trapezius          Lower trapezius          Elbow flexion   3+/5 4/5 N/A 4/5 4/5  Elbow extension   3+/5 4/4 N/A 4/5 4/5  Wrist flexion          Wrist extension   2-/5 3-/5 3-/5 3/5 3+/5  Wrist ulnar deviation          Wrist radial deviation          Wrist pronation          Wrist supination          (Blank  rows = not tested)   HAND FUNCTION: Grip strength: Right: 26#, Left: 10# Pinch strength: Right 8#, Left: 3#, 3 Pt. Pinch strength: Right: 9#, L: 2#  01/22/2023 Grip strength: Right: 26#, Left: 12# Pinch strength:  Pinch meter used at the initial eval has been sent out for recalibration   03/03/2023 Grip strength: Right: 26#, Left: 13# Pinch strength:  Pinch meter used at the initial eval has been sent out for  recalibration  04/14/2023: Grip strength: Right: 26#, Left: 13# Pinch strength:   Right 8#, Left: 4#, 3 Pt. Pinch strength: Right: 9#, L: 3#    05/28/2023: Grip strength: Right: 26#, Left: 13# Pinch strength:   Right 8#, Left: 2#, 3 Pt. Pinch strength: Right: 9#, L: 4#     COORDINATION: Right: 22 sec., Left: <5 min. To place 7 pegs with increased compensation proximally in the trunk, and through reflexive associated reactions.  01/22/23 Right: 22 sec., Left: 3 min. & 4 sec.  03/03/23 Right: 22 sec., Left: 1 min. & 39 sec.  04/14/23   TBD  05/28/23 Right: 22 sec., Left:  5 min.      SENSATION: Light touch: WFL, proprioceptive awareness: Intact   EDEMA: N/A   MUSCLE TONE: LUE: Hypotonic   COGNITION: Overall cognitive status: WFL for tasks assessed. Pt. Is impulsive at times.   VISION: Subjective report: Pt. report having shingles affecting left eye  s/p 1 year. Has nerve pain Baseline vision: Wears glasses for reading only Visual history:  updated see clinical impression   VISION ASSESSMENT:    WFL for tasks performed   PERCEPTION: Intact   PRAXIS: Impaired: Motor planning   OBSERVATIONS:  Pt. more alert, and engaging since prior to the most recent hospitalization.     TODAY'S TREATMENT:    Therapeutic Ex.   Pt. Worked on L 2nd - 5th digit extension at the tabletop. Pt. Performed active supination with a hand held wooden dowel x 10 reps.   Neuromuscular re-education:   Pt. performed left hand FMC tasks grasping 1/2" small circular tipped pegs, and placing them into a small flat pegboard using tip pinch. Pt. Utilized a 3pt. Grasp with red resistive clips to remove the pegs. Pt. Worked on combinations of movements by adding isolated forearm supination int the task while securely holding the peg in the clips. Pt. Required proprioceptive input through the hand on the tabletop during the task.        [name] EDUCATION: Education details: left hand digit  extension Person educated: [name] and Spouse Education method: Medical illustrator Education comprehension: verbalized understanding, returned demonstration, and needs further education     HOME EXERCISE PROGRAM:    Reviewed activities at home to promote isolated 2nd digit extension.    GOALS: Goals reviewed with [name]? Yes   SHORT TERM GOALS: Target date:  07/09/2023         1. [name] will be independent with home exercise program for the left upper extremity Baseline: 05/28/2023: Pt. Requires assist 04/09/2023: Pt. Continues to consistently attempt to engage her hand at hand 02/01/2023: Pt. Consistently attempts to perform HEPs independently. No current home exercise program Goal status: Ongoing     LONG TERM GOALS: Target date:  08/20/2023     1.  [name] will improve left shoulder strength by 2 mm grades to be able to sustain UE's in elevation long enough to wash her hair.  Baseline: 05/28/2023: Left shoulder flexion: 3-/5, abduction: 3-/5. Pt. Has an old left shoulder injury limiting  progression with left shoulder strength 05/26/2023: Pt. continues to present with limitiations in sustaining LUE elevation long enough to perform hair care thoroughly 04/09/2023: Pt. is limited with sustaining LUE elevation long enough to perform hair care thoroughly. 03/03/2023: Left shoulder flexion: 3-/5, abduction: 3-/5 01/22/2023: Left shoulder flexion: 3-/5, abduction: 3-/5 Eval: Left shoulder flexion: 3/5, abduction: 3-/5 Goal status: Not met, Deferred   2.  [name] will improve left shoulder active abduction to be able to comb her hair Baseline: 05/28/2023: left shoulder flexion: 108 abduction: 85 Pt. Has an old left shoulder injury limiting progression with left shoulder ROM 05/26/2023:  5/10 left shoulder pain with ROM limits using it functionally during hair care. 04/09/2023: Pt. Presents with difficulty abducting her left shoulder enough to thoroughly complete haircare. 03/03/2023: Shoulder  abduction: 85 01/22/2023: Shoulder abduction: 85 Eval: Left shoulder abduction is 80(108) Goal status: Not met, Deferred   3.  [name] will independently button her shirt with modified independence. Baseline: 05/28/2023: pt. Continues to work towards progressing with buttoning 05/26/2023:  Pt. Continues to progress towards buttoning. 04/09/2023: Pt. Continues to progress towards buttoning. 03/03/2023: Pt. Continues to have difficulty with buttoning. 01/22/2023: Pt. continues to have difficulty. Eval: [name] has difficulty.  Goal status: Ongoing   4.  [name] well improve left grip strength in preparation for securely holding flowers. Baseline: 05/28/2023: Right: 26#, Left: 13#  pt. Presents with difficulty securely holding flowers in her left hand. 05/26/2023: TBD 04/09/2023: Pt. Is able to hold, and hike pants with the left hand, continues to have difficulty with securely holding flowers.   03/03/2023: left grip strength: 13# 01/22/2023: Left: 12# Eval: Pt. Is unable to securely hold flowers. Goal status: Ongoing   5.  Pt. will independently recall adaptive  strategies for performing ADL tasks including: flossing teeth, donning bra, applying makeup. Baseline: 05/28/2023: Pt. Needs continued education about adaptive strategies.05/26/2023: Continue 04/09/2023: Continue3/26/2024: Continue 01/22/2023: Pt. continues to benefit from education about adaptive strategies during ADLs, and IADLs. Eval: Pt. to be provided with adaptive strategies. Goal status: Ongoing   6.  Pt. will improve FOTO score by 2 points to reflect [name] perceived performance improvement assessment specific ADLs  and IADLs Baseline: 03/16/8118: FOTO score 59 05/26/2023: TBD 5/02/204:  TBD 03/03/23: FOTO 62 Eval: 57 Goal status: Ongoing  7.  Pt. will improve left hand coordination skills in order to be able to handle, and sort utensils in a drawer.     Baseline: 05/28/2023: Left:  5 min. 05/26/2023 Pt. Continues to present with difficulty handling  and sorting utensils. 04/14/2023: 56 04/09/2023: TBD 03/03/23: 1 min. & 39 01/22/2022: Left: 3 min. & 4 sec. Eval: Pt. has difficulty sorting, and placing utensils with the left hand. Left FMC : >5 min. For 7 pegs on the 9 hole peg test.    Goal Status:  Ongoing  8. Pt. will improve active left 2nd digit extension to be able able to isolate her 2nd digit in preparation for pressing/pushing buttons on appliances, phones, or remotes. Baseline: 05/28/2023: Pt. Continues to improve with left 2nd digit extension 05/26/2023: Improving with left 2nd digit extension. 04/09/2023: Pt. Is progressing with isolating left 2nd digit extension, however continues to present with limited increased flexor tone. 03/03/23: Pt. Continues to work on improving consistency with 2nd digit extension to press the remote. 01/22/2023: is able to perform full digit extension, although 2nd digit is slow to extend s 2/2 flexor tone. Pt.  Eval: Pt. is able to is unable to actively perform full  digit extension Goal status:  Ongoing   ASSESSMENT:   CLINICAL IMPRESSION:  Pt. reported that she has not been doing the wooden dowel exercises at home. Pt. Tolerated all therapeutic exercise well. Pt. Was able to use tip pinch to pick up pegs and place them onto flat peg board however required increased time and verbal cues due to difficulties manipulating peg into correct position in hand to place it onto the peg board. Pt. Continues to require cues, and assist for hand placement for 3pt. pinch position on red resistive clip. Pt. Required proprioceptive input through the hand on the tabletop throughout completing the task. Pt continues to benefit from skilled OT services to work on improving L shoulder, elbow, and wrist strength, improving motor control and coordination in order to prepare the left upper extremity and hand for functional reaching and engaging the upper left extremity when performing hair care, buttoning, holding flowers, cutting food, and  manipulating utensils to set the table as well as adaptive strategies during daily IADL care.     PERFORMANCE DEFICITS in functional skills including ADLs, IADLs, coordination, proprioception, ROM, strength, FMC, and GMC, cognitive skills including memory, and psychosocial skills including coping strategies, environmental adaptation, interpersonal interactions, and routines and behaviors.    IMPAIRMENTS are limiting [name] from ADLs, IADLs, education, leisure, and social participation.    COMORBIDITIES may have co-morbidities  that affects occupational performance. [name] will benefit from skilled OT to address above impairments and improve overall function.   MODIFICATION OR ASSISTANCE TO COMPLETE EVALUATION: Min-Moderate modification of tasks or assist with assess necessary to complete an evaluation.   OT OCCUPATIONAL PROFILE AND HISTORY: Detailed assessment: Review of records and additional review of physical, cognitive, psychosocial history related to current functional performance.   CLINICAL DECISION MAKING: Moderate - several treatment options, min-mod task modification necessary   REHAB POTENTIAL: Good   EVALUATION COMPLEXITY: Moderate      PLAN: OT FREQUENCY: 2x/week   OT DURATION: 12 weeks   PLANNED INTERVENTIONS: self care/ADL training, therapeutic exercise, therapeutic activity, neuromuscular re-education, manual therapy, passive range of motion, functional mobility training, electrical stimulation, and paraffin   RECOMMENDED OTHER SERVICES: PT   CONSULTED AND AGREED WITH PLAN OF CARE: [name] and family member/caregiver   PLAN FOR NEXT SESSION: Initiate OT treatment  Herma Carson, OTS 06/24/2023 8:17 AM   This entire session was performed under the direct supervision and direction of a licensed therapist. I have personally read, edited, and approve of the note as written.  Olegario Messier, MS, OTR/L   06/24/2023

## 2023-06-25 ENCOUNTER — Ambulatory Visit: Payer: Medicare HMO | Admitting: Physical Therapy

## 2023-06-25 ENCOUNTER — Ambulatory Visit: Payer: Medicare HMO | Admitting: Occupational Therapy

## 2023-06-25 DIAGNOSIS — R2681 Unsteadiness on feet: Secondary | ICD-10-CM | POA: Diagnosis not present

## 2023-06-25 DIAGNOSIS — R278 Other lack of coordination: Secondary | ICD-10-CM | POA: Diagnosis not present

## 2023-06-25 DIAGNOSIS — M6281 Muscle weakness (generalized): Secondary | ICD-10-CM

## 2023-06-25 DIAGNOSIS — R531 Weakness: Secondary | ICD-10-CM

## 2023-06-25 DIAGNOSIS — R262 Difficulty in walking, not elsewhere classified: Secondary | ICD-10-CM

## 2023-06-25 NOTE — Therapy (Signed)
OUTPATIENT PHYSICAL THERAPY NEURO TREATMENT    Patient Name: [name] MRN: [id number] DOB:1941-06-14, 82 y.o., female Today's Date: 06/25/2023   PCP: Aram Beecham, D MD REFERRING PROVIDER: Jacquelynn Cree PA   END OF SESSION:  PT End of Session - 06/25/23 1623     Visit Number 11    Number of Visits 24    Date for PT Re-Evaluation 07/07/23    Authorization Type 3/10 eval 04/14/23    Progress Note Due on Visit 10    PT Start Time 1622    PT Stop Time 1700    PT Time Calculation (min) 38 min    Equipment Utilized During Treatment Gait belt    Activity Tolerance Patient tolerated treatment well;Patient limited by pain    Behavior During Therapy WFL for tasks assessed/performed                 Past Medical History:  Diagnosis Date   Anemia    Arthritis    Asthma    uses inhaler just prior to surgery to avoid attack   Back pain    from previous injury   Complication of anesthesia    has woken  up during 2 different surgery   Depression    no current issue/treatment; situation   Gallstones    GERD (gastroesophageal reflux disease)    Hiatal hernia    patient does NOT have nerve/muscle disease   History of kidney stones    HLD (hyperlipidemia)    HTN (hypertension)    Hypothyroidism    Kidney stones    Knee pain    Nausea and vomiting 10/15/2022   Non-diabetic pancreatic hormone dysfunction years   pt. 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   Pancreatitis    Pneumonia    Seizures (HCC)    caused by dye injected during a procedure   Shortness of breath    with exertion   Sinus problem    frequent infections/congestion   Stroke (HCC) 2021   reports having CVA in 2021 and having mini strokes before that   Thyroid disease    Past Surgical History:  Procedure Laterality Date   ABDOMINAL HYSTERECTOMY     APPENDECTOMY     CARPAL TUNNEL RELEASE  10+ years ago   bilateral   EYE SURGERY  3 yrs ago   bilateral cataracts   FOOT OSTEOTOMY  6 weeks  ago   Left foot: great, 2nd & 3rd   FOOT OSTEOTOMY  5 years ago   Right great toe   HAND SURGERY Bilateral 2011-most recent   multiple hand surgeries, 2 on left, 3 on right   KNEE ARTHROPLASTY Right 04/28/2022   Procedure: COMPUTER ASSISTED TOTAL KNEE ARTHROPLASTY;  Surgeon: Donato Heinz, MD;  Location: [facility];  Service: Orthopedics;  Laterality: Right;   LOOP RECORDER INSERTION N/A 05/16/2020   Procedure: LOOP RECORDER INSERTION;  Surgeon: Marcina Millard, MD;  Location: [facility];  Service: Cardiovascular;  Laterality: N/A;   NASAL SINUS SURGERY  most recent 7-8 yrs ago   7 sinus surgeries    TRIGGER FINGER RELEASE  11/19/2011   Procedure: RELEASE TRIGGER FINGER/A-1 PULLEY;  Surgeon: Nicki Reaper, MD;  Location: [facility];  Service: Orthopedics;  Laterality: Right;  release a-1 pulley right index finger and cyst removal   WRIST GANGLION EXCISION  1980's   right   Patient Active Problem List   Diagnosis Date Noted   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 11/27/2022   Hypomagnesemia 11/27/2022   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   HLD (hyperlipidemia) 11/23/2022   Asthma 11/23/2022   Depression with anxiety 11/23/2022   Chronic diastolic CHF (congestive heart failure) (HCC) 11/23/2022   Normocytic anemia 11/23/2022   Aspiration pneumonia (HCC) 11/23/2022   AKI (acute kidney injury) (HCC) 11/23/2022   Abdominal pain 11/23/2022   Mesenteric mass 11/23/2022   Coffee ground emesis 11/23/2022   Nausea and vomiting 10/15/2022   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   Fatigue 09/17/2022   Left hemiparesis (HCC) 09/17/2022   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HCC) 08/22/2022   GERD (gastroesophageal reflux disease) 08/21/2022   Agitation 08/20/2022   Acute left-sided weakness 08/20/2022   Expressive aphasia    Stroke (HCC) 08/19/2022   Leukocytosis 08/19/2022   History of urticaria 04/28/2022   Total knee replacement status  04/28/2022   Primary osteoarthritis of left knee 02/24/2022   Primary osteoarthritis of right knee 02/24/2022   Lumbar spondylolysis 04/12/2020   History of CVA (cerebrovascular accident) 03/26/2020   Low back pain radiating to right lower extremity 03/21/2020   B12 deficiency 03/06/2020   Positive anti-CCP test 12/21/2019   Arthralgia 12/13/2019   Dermatitis 12/13/2019   Rheumatoid factor positive 12/13/2019   Essential hypertension 12/11/2018   Palpitations 12/11/2018   Acquired hypothyroidism 11/10/2018   Arthritis of knee 09/17/2016   Anxiety 11/22/2014   Asthma without status asthmaticus 11/22/2014   Benign neoplasm of colon, unspecified 11/22/2014   Environmental allergies 11/22/2014   Hypertriglyceridemia 11/22/2014   Hypokalemia 11/22/2014   Personal history of disease of skin and subcutaneous tissue 11/22/2014    ONSET DATE: 11/23/22  REFERRING DIAG: TIA  THERAPY DIAG:  Muscle weakness (generalized)  Unsteadiness on feet  Difficulty in walking, not elsewhere classified  Other lack of coordination  Acute left-sided weakness  Rationale for Evaluation and Treatment: Rehabilitation  SUBJECTIVE:                                                                                                                                                                                             SUBJECTIVE STATEMENT: Pt reports that she is doing well. States that she is very tired and feeling like she needs a nap, after already taking once this afternoon.   Pt also reports that she had almost nothing to eat or drink all day.   Pt accompanied by: significant other  PERTINENT HISTORY: Patient is an 82 year-old female who was admitted to [facility] on 11/23/2022 with a cerebellar CVA. 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 Pt. Was admitted to Inpatient Rehab from 12/21-1/02/2023.  Pt was previously diagnosed with a right thalamic CVA on 08/18/2022  with left-sided weakness.  Patient underwent inpatient rehabilitation for 2 weeks.  Patient was assessed and was scheduled for a knee replacement on 08/29/2022 however had to cancel it due to having had a CVA.   PMH includes: anemia, arthritis, back pain, depression, hernia, GERD, HLD, HTN, hypothyroidism, knee pain, pancreatic hormone dysfunction, pneumonia, seizures, stroke, thyroid disease.  Patient has a donjoy brace for L knee. Walking with hand hold help with husband.   PAIN:  Are you having pain? Yes: NPRS scale: 0/10 Pain location: L knee Pain description: aching, gives out Aggravating factors: climbing into pain, moving it, walking around house Relieving factors: rest   PRECAUTIONS: Fall, has latex allergy  WEIGHT BEARING RESTRICTIONS: No  FALLS: Has patient fallen in last 6 months? Yes. Number of falls 4 falls   LIVING ENVIRONMENT: Lives with: lives with their family and lives with their spouse Lives in: House/apartment Stairs: Yes: Internal: flight steps;   and External: 2 steps; on right going up and on left going up Has following equipment at home: Single point cane, Walker - 2 wheeled, Shower bench, and bed side commode  PLOF: Independent  PATIENT GOALS: to have less pain and move better  OBJECTIVE:   DIAGNOSTIC FINDINGS:  Disc levels:   C2-3: No significant stenosis.   C3-4: A rightward disc osteophyte complex effaces the ventral CSF. Severe left and moderate right foraminal stenosis is present.   C4-5: A broad-based disc osteophyte complex effaces the ventral CSF. The canal is narrowed 7 mm. Severe left and moderate right foraminal stenosis is present.   C5-6: Chronic loss of disc height is present. Broad-based disc osteophyte complex is asymmetric to the right. Partial effacement of the ventral CSF is present. Moderate foraminal stenosis is worse right than left.   C6-7: A broad-based disc osteophyte complex is present. This partially effaces the ventral  CSF. Moderate foraminal narrowing is worse right than left.   C7-T1: Uncovertebral spurring is present bilaterally. Mild bilateral foraminal stenosis is present.   IMPRESSION: 1. Multilevel spondylosis of the cervical spine as described. 2. 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 3. 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 4. No other significant cord signal abnormality to suggest ischemic changes to the cord. 5. Severe left and moderate right foraminal stenosis at C3-4 and C4-5. 6. Moderate foraminal stenosis bilaterally at C5-6 and C6-7 is worse on the right. 7. Mild bilateral foraminal narrowing at C7-T1. 8. Prominent soft tissue pannus at C1-2 effaces the ventral CSF. 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  IMPRESSION: 1. 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 2. 9 mm right occipital pole infarct. 3. Punctate white matter infarct in the left occipital lobe. 4. Additional punctate cortical infarct in the medial right occipital lobe more superiorly. 5. Expected evolution of previous right posterior frontal lobe white matter infarct. 6. Stable atrophy and white matter disease likely reflects the sequela of chronic microvascular ischemia.  COGNITION: Overall cognitive status:  two strokes   SENSATION: WFL  COORDINATION: Heel slide test limited by LLE pain  POSTURE: rounded shoulders, forward head, and flexed trunk   LOWER EXTREMITY ROM:     Bowing of L knee  LOWER EXTREMITY MMT:    MMT Right Eval Left Eval R 7/16 L 7/16  Hip flexion 3 3* 3+ 3+  Hip extension      Hip abduction 3 3* 4- 4-  Hip adduction 3 * 4- 4-  Hip internal rotation      Hip external rotation      Knee flexion 3 3* 4- 4-  Knee extension 3 3* 4- 4-  Ankle dorsiflexion 4 4 4 4   Ankle plantarflexion 4  4 4 4   (Blank rows = not tested) * pain  BED MOBILITY:  Per patient she has difficulty, occasionally needs help  TRANSFERS: Assistive device utilized: None  Sit to stand: CGA and Min A Stand to sit: Min A Chair to chair: CGA    GAIT: Gait pattern: step to pattern, decreased stance time- Left, genu recurvatum- Left, and shuffling Distance walked: 10 ft Assistive device utilized: None Level of assistance: CGA Comments: Patient is very painful with ambulation, unable to ambulate longer duration   FUNCTIONAL TESTS:  5 times sit to stand: unable to tolerate 10 meter walk test: unable to tolerate  PATIENT SURVEYS:  FOTO 47  TODAY'S TREATMENT:                                                                                                                              DATE: 06/25/23   Sit<>stand with CGA.  Forward/reverse gait x 26ft with CGA. UE support on RW>  Pt then reports increased nausea and dry heave, but no emesis.  VS assessment  sitting122/67 HR76.   3 min rest break. Pt reports s/s of ceased.   Standing 105/64 HR84 mild s/s of nausea   1 min standin 100/58 HR 85 severe s/s or increased nausea and noted to have near syncope.    Sitting 121/59 HR75 improved S/S After 5 min seated rest break, pt reports that she is feeling much better  Seated therex:  HS curl x 12 bil  Hip abduction x 12 bil  LAQ x 10 bil  LE pushdown x 10 bil    PATIENT EDUCATION: Education details: Pt educated throughout session about proper posture and technique with exercises. Improved exercise technique, movement at target joints, use of target muscles after min to mod verbal, visual, tactile cues.  Importance of nutrition and hydration prior to PT treatment to prevent orthostatic hypotension from low fluid volumes.   Person educated: Patient and Spouse Education method: Explanation, Demonstration, Tactile cues, and Verbal cues Education comprehension: verbalized understanding, returned  demonstration, verbal cues required, and tactile cues required  HOME EXERCISE PROGRAM: continue HEP as previously given  Access Code: [email or web address] URL: [email or web address] Date: 06/23/2023 Prepared by: Grier Rocher  Exercises - Standing March with Counter Support  - 1 x daily - 7 x weekly - 2 sets - 10 reps - Standing Hip Abduction with Unilateral Counter Support  - 1 x daily - 7 x weekly - 2 sets - 8 reps  Access Code: [email or web address] URL: [email or web address] Date: 04/14/2023 Prepared by: Precious Bard  Exercises - Seated Single Leg Hip Abduction  -  1 x daily - 7 x weekly - 2 sets - 10 reps - 5 hold - Seated Heel Slide  - 1 x daily - 7 x weekly - 2 sets - 10 reps - 5 hold - Seated Heel Toe Raises  - 1 x daily - 7 x weekly - 2 sets - 10 reps - 5 hold  GOALS: Goals reviewed with patient? Yes  SHORT TERM GOALS: Target date: 05/12/2023    Patient will be independent in home exercise program to improve strength/mobility for better functional independence with ADLs. Baseline: 5/7:  Goal status: INITIAL    LONG TERM GOALS: Target date: 07/07/2023    Patient will increase FOTO score to equal to or greater than   57%  to demonstrate statistically significant improvement in mobility and quality of life.  Baseline:  47%  7/16: 60%  Goal status: INITIAL  2.  Patient (> 48 years old) will complete five times sit to stand test in < 15 seconds indicating an increased LE strength and improved balance. Baseline: 5/7: unable to tolerate test  7/16: 17.4sec  Goal status: IN PROGRESS  3.   Patient will deny any falls over past 4 weeks to demonstrate improved safety awareness at home and work.  Baseline: 5/7: multiple falls   1 fall from bed last week.  Goal status: IN PROGRESS  4.  Patient will increase BLE gross strength to 4+/5 as to improve functional strength for independent gait, increased standing tolerance and increased ADL ability Baseline: 5/7: see  above  7/16: improved to grossly 4-/5 overall  Goal status: INITIAL  5.  Patient will be able to perform household work/ chores without increase in symptoms. Baseline: 5/7: unable to perform   7/16: Pt reports that she has returned to doing some housework again, but reports that she is performing from [facility].  Goal status: IN PROGRESS  6. Pt will increase gait speed to >0.8 m/s to indicate reduced fall risk and improved safety with house hold level ambulation.  Baseline: 7/16: 0.459m/s  Goal status: New      ASSESSMENT:  CLINICAL IMPRESSION:  Pt presents with increased fatigue and nausea on this day. Able to stand without pain on the LLE. But experiencing nausae. BP assessed and pt noted to have orhtostatic hypotension. With near syncopal episode after standing for 1.5 min. Pt verbalized that she did not eat or drink much throughout the day, and can feel nauseous when BP in dropping. Session limited to seated therex with encouragement from PT to follow up with MD if these s/s do not improve after a meal and hydration.   The patient will benefit from skilled physical therapy to reduce pain, improve mobility, improve strength and improve quality of life.     OBJECTIVE IMPAIRMENTS: Abnormal gait, cardiopulmonary status limiting activity, decreased activity tolerance, decreased balance, decreased cognition, decreased endurance, decreased knowledge of use of DME, decreased mobility, difficulty walking, decreased ROM, decreased strength, decreased safety awareness, hypomobility, increased fascial restrictions, impaired perceived functional ability, impaired flexibility, impaired UE functional use, improper body mechanics, postural dysfunction, and pain.   ACTIVITY LIMITATIONS: carrying, lifting, bending, sitting, standing, squatting, stairs, transfers, bed mobility, bathing, toileting, dressing, reach over head, hygiene/grooming, locomotion level, and caring for others  PARTICIPATION LIMITATIONS:  meal prep, cleaning, laundry, medication management, personal finances, interpersonal relationship, driving, shopping, community activity, yard work, school, and church  PERSONAL FACTORS: Age, Behavior pattern, Education, Fitness, Past/current experiences, Time since onset of injury/illness/exacerbation, and 3+ comorbidities: anemia, arthritis, back  pain, depression, hernia, GERD, HLD, HTN, hypothyroidism, knee pain, pancreatic hormone dysfunction, pneumonia, seizures, stroke, thyroid disease  are also affecting patient's functional outcome.   REHAB POTENTIAL: Fair    CLINICAL DECISION MAKING: Evolving/moderate complexity  EVALUATION COMPLEXITY: Moderate  PLAN:  PT FREQUENCY: 1-2x/week  PT DURATION: 12 weeks  PLANNED INTERVENTIONS: Therapeutic exercises, Therapeutic activity, Neuromuscular re-education, Balance training, Gait training, Patient/Family education, Self Care, Joint mobilization, Joint manipulation, Stair training, Vestibular training, Canalith repositioning, Visual/preceptual remediation/compensation, Orthotic/Fit training, DME instructions, Electrical stimulation, Wheelchair mobility training, Spinal mobilization, Cryotherapy, Moist heat, Compression bandaging, scar mobilization, Splintting, Taping, Traction, Ultrasound, Manual therapy, and Re-evaluation  PLAN FOR NEXT SESSION:  Standing balance and gait as tolerated   seated and supine strengthening of LLE,   Golden Pop, PT 06/25/2023, 5:46 PM

## 2023-06-25 NOTE — Therapy (Addendum)
Occupational Therapy Neuro Treatment Note   Patient Name: [name] MRN: [id number] DOB:May 12, 1941, 82 y.o., female Today's Date: 06/25/2023  PCP: Dr. Judithann Sheen REFERRING PROVIDER:  Dr. Judithann Sheen  END OF SESSION:  PLAN:   OT End of Session - 06/25/23 1705     Visit Number 47    Number of Visits 72    Date for OT Re-Evaluation 08/20/23    OT Start Time 1430    OT Stop Time 1515    OT Time Calculation (min) 45 min    Equipment Utilized During Treatment Wheelchair    Activity Tolerance Patient tolerated treatment well    Behavior During Therapy WFL for tasks assessed/performed                         Past Medical History:  Diagnosis Date   Anemia    Arthritis    Asthma    uses inhaler just prior to surgery to avoid attack   Back pain    from previous injury   Complication of anesthesia    has woken  up during 2 different surgery   Depression    no current issue/treatment; situation   Gallstones    GERD (gastroesophageal reflux disease)    Hiatal hernia    patient does NOT have nerve/muscle disease   History of kidney stones    HLD (hyperlipidemia)    HTN (hypertension)    Hypothyroidism    Kidney stones    Knee pain    Nausea and vomiting 10/15/2022   Non-diabetic pancreatic hormone dysfunction years   pt. 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   Pancreatitis    Pneumonia    Seizures (HCC)    caused by dye injected during a procedure   Shortness of breath    with exertion   Sinus problem    frequent infections/congestion   Stroke (HCC) 2021   reports having CVA in 2021 and having mini strokes before that   Thyroid disease    Past Surgical History:  Procedure Laterality Date   ABDOMINAL HYSTERECTOMY     APPENDECTOMY     CARPAL TUNNEL RELEASE  10+ years ago   bilateral   EYE SURGERY  3 yrs ago   bilateral cataracts   FOOT OSTEOTOMY  6 weeks ago   Left foot: great, 2nd & 3rd   FOOT OSTEOTOMY  5 years ago   Right great toe   HAND SURGERY  Bilateral 2011-most recent   multiple hand surgeries, 2 on left, 3 on right   KNEE ARTHROPLASTY Right 04/28/2022   Procedure: COMPUTER ASSISTED TOTAL KNEE ARTHROPLASTY;  Surgeon: Donato Heinz, MD;  Location: [facility];  Service: Orthopedics;  Laterality: Right;   LOOP RECORDER INSERTION N/A 05/16/2020   Procedure: LOOP RECORDER INSERTION;  Surgeon: Marcina Millard, MD;  Location: [facility];  Service: Cardiovascular;  Laterality: N/A;   NASAL SINUS SURGERY  most recent 7-8 yrs ago   7 sinus surgeries    TRIGGER FINGER RELEASE  11/19/2011   Procedure: RELEASE TRIGGER FINGER/A-1 PULLEY;  Surgeon: Nicki Reaper, MD;  Location: [facility];  Service: Orthopedics;  Laterality: Right;  release a-1 pulley right index finger and cyst removal   WRIST GANGLION EXCISION  1980's   right   Patient Active Problem List   Diagnosis Date Noted   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 11/27/2022   Hypomagnesemia 11/27/2022   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  HLD (hyperlipidemia) 11/23/2022   Asthma 11/23/2022   Depression with anxiety 11/23/2022   Chronic diastolic CHF (congestive heart failure) (HCC) 11/23/2022   Normocytic anemia 11/23/2022   Aspiration pneumonia (HCC) 11/23/2022   AKI (acute kidney injury) (HCC) 11/23/2022   Abdominal pain 11/23/2022   Mesenteric mass 11/23/2022   Coffee ground emesis 11/23/2022   Nausea and vomiting 10/15/2022   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   Fatigue 09/17/2022   Left hemiparesis (HCC) 09/17/2022   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HCC) 08/22/2022   GERD (gastroesophageal reflux disease) 08/21/2022   Agitation 08/20/2022   Acute left-sided weakness 08/20/2022   Expressive aphasia    Stroke (HCC) 08/19/2022   Leukocytosis 08/19/2022   History of urticaria 04/28/2022   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   Primary osteoarthritis of left knee 02/24/2022   Primary osteoarthritis of right knee  02/24/2022   Lumbar spondylolysis 04/12/2020   History of CVA (cerebrovascular accident) 03/26/2020   Low back pain radiating to right lower extremity 03/21/2020   B12 deficiency 03/06/2020   Positive anti-CCP test 12/21/2019   Arthralgia 12/13/2019   Dermatitis 12/13/2019   Rheumatoid factor positive 12/13/2019   Essential hypertension 12/11/2018   Palpitations 12/11/2018   Acquired hypothyroidism 11/10/2018   Arthritis of knee 09/17/2016   Anxiety 11/22/2014   Asthma without status asthmaticus 11/22/2014   Benign neoplasm of colon, unspecified 11/22/2014   Environmental allergies 11/22/2014   Hypertriglyceridemia 11/22/2014   Hypokalemia 11/22/2014   Personal history of disease of skin and subcutaneous tissue 11/22/2014   REFERRING DIAG: CVA   THERAPY DIAG:  Muscle weakness (generalized)   Other lack of coordination   Rationale for Evaluation and Treatment Rehabilitation   SUBJECTIVE:   Pt. reports that she is doing well today and that the scratch on her R hand is doing much better from the cat scratching it.  SUBJECTIVE STATEMENT:   Pt accompanied by: significant other   PERTINENT HISTORY: Patient is an 82 year-old female who was admitted to [facility] on 11/23/2022 with a cerebellar CVA. 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 Pt. Was admitted to Inpatient Rehab from 12/21-1/02/2023. 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  Patient underwent inpatient rehabilitation for 2 weeks.  Patient was assessed and was scheduled for a knee replacement on 08/29/2022 however had to cancel it due to having had a CVA.  Patient had a recent fall 2 days after discharging from inpatient rehab. Past Medical History includes: Knee replacement, essential HTN, hypokalemia, leukocytosis, seizures, positive anti-- CCP test, anxiety disorder, mini strokes.  Patient had shingles with left eye nerve pain s/p 1 year  ago.    PRECAUTIONS: Fall   WEIGHT BEARING RESTRICTIONS No   PAIN:  Are you having pain? 1/10 pain in LUE   FALLS: Has patient fallen in last 6 months? Yes. Number of falls 1   LIVING ENVIRONMENT: Lives with: Lives with Spouse Lives in: House/apartment Stairs: 2 storey home, resides on the first floor.  External: 2 stairs front no rails, and 6 in back with rails Has following equipment at home: Single point cane, Walker - 2 wheeled, Environmental consultant - 4 wheeled, Shower bench, and bed side commode   PLOF: Independent   PATIENT GOALS  To Regain the use of her left arm   OBJECTIVE:    HAND DOMINANCE: Right   ADLs: Overall ADLs: Husband assists pt. as needed Transfers/ambulation related to ADLs:Pt. Uses a 3 wheeled walker with Husband assist. Eating:  Pt. Is independent with the right hand. Pt. has difficulty cutting food. Grooming: Pt. is using her right hand, however has difficulty sustaining her LUE in elevation to assist with haircare. UB Dressing: Pt. Is independent donning a pullover shirt, and button down shirt. Has difficulty with buttoning, LB Dressing:  Independent donning pants, and socks. Difficulty tying shoes. Toileting: Independent Bathing: Pt. Is able to engage her right hand. Tub Shower transfers: Supervision Equipment: See above for equipment     IADLs: Shopping:  Has not had the opportunity for grocery shopping yet Light housekeeping: Husband is assisting with light house keeping Meal Prep:  Dependent Community mobility: Relies of family/friends Medication management: Husband assisting with weekly pillbox set-up, and administering medication. Financial management: TBD Handwriting: 75% legible   MOBILITY STATUS: Hx of falls   POSTURE COMMENTS:  No Significant postural limitations Sitting balance: supported sitting balance WFL   ACTIVITY TOLERANCE: Activity tolerance:  Fatigues in greater than 30 min.    FUNCTIONAL OUTCOME MEASURES: FOTO:  57  05/28/2023  FOTO:    UPPER EXTREMITY ROM      Active ROM Right Eval: [facility] Left eval Left  01/22/23 Left  03/03/23 Left  04/14/23 Left  05/28/2023  Shoulder flexion   132 100 108 108 108  Shoulder abduction   80 85 85 85 85  Shoulder adduction          Shoulder extension          Shoulder internal rotation          Shoulder external rotation          Elbow flexion   140 140 WFL WFL WFL  Elbow extension   WNL WNL [facility] WFL WFL  Wrist flexion   65 68     Wrist extension   -10 20 24  32 32  Wrist ulnar deviation     12 10 14 14   Wrist radial deviation     8 14 14 16   Wrist pronation          Wrist supination          (Blank rows = not tested)   Left digit flexion to [facility]: 2nd: 0cm, 3rd: 0cm, 4th: 0cm, 5th: 0cm   Limited Left full 2nd digit extension     UPPER EXTREMITY MMT:      MMT Right Eval: 4+/5 overall Left Eval Left 01/22/23 Left 03/03/23 Left  04/14/2023 Left 05/28/2023  Shoulder flexion   3/5 3-/5 3-/5 3-/5 3-/5  Shoulder abduction   3-/5 3-/5 3-/5 3-/5 3-/5  Shoulder adduction          Shoulder extension          Shoulder internal rotation          Shoulder external rotation          Middle trapezius          Lower trapezius          Elbow flexion   3+/5 4/5 N/A 4/5 4/5  Elbow extension   3+/5 4/4 N/A 4/5 4/5  Wrist flexion          Wrist extension   2-/5 3-/5 3-/5 3/5 3+/5  Wrist ulnar deviation          Wrist radial deviation          Wrist pronation          Wrist supination          (Blank rows = not tested)  HAND FUNCTION: Grip strength: Right: 26#, Left: 10# Pinch strength: Right 8#, Left: 3#, 3 Pt. Pinch strength: Right: 9#, L: 2#  01/22/2023 Grip strength: Right: 26#, Left: 12# Pinch strength:  Pinch meter used at the initial eval has been sent out for recalibration   03/03/2023 Grip strength: Right: 26#, Left: 13# Pinch strength:  Pinch meter used at the initial eval has been sent out for recalibration  04/14/2023: Grip strength: Right:  26#, Left: 13# Pinch strength:   Right 8#, Left: 4#, 3 Pt. Pinch strength: Right: 9#, L: 3#    05/28/2023: Grip strength: Right: 26#, Left: 13# Pinch strength:   Right 8#, Left: 2#, 3 Pt. Pinch strength: Right: 9#, L: 4#     COORDINATION: Right: 22 sec., Left: <5 min. To place 7 pegs with increased compensation proximally in the trunk, and through reflexive associated reactions.  01/22/23 Right: 22 sec., Left: 3 min. & 4 sec.  03/03/23 Right: 22 sec., Left: 1 min. & 39 sec.  04/14/23   TBD  05/28/23 Right: 22 sec., Left:  5 min.      SENSATION: Light touch: WFL, proprioceptive awareness: Intact   EDEMA: N/A   MUSCLE TONE: LUE: Hypotonic   COGNITION: Overall cognitive status: WFL for tasks assessed. Pt. Is impulsive at times.   VISION: Subjective report: Pt. report having shingles affecting left eye  s/p 1 year. Has nerve pain Baseline vision: Wears glasses for reading only Visual history:  updated see clinical impression   VISION ASSESSMENT:    WFL for tasks performed   PERCEPTION: Intact   PRAXIS: Impaired: Motor planning   OBSERVATIONS:  Pt. more alert, and engaging since prior to the most recent hospitalization.     TODAY'S TREATMENT:    Therapeutic Ex.   Pt. Worked on L 2nd - 5th digit extension at the tabletop. Pt. Worked on BUE strengthening, and reciprocal motion using the UBE while seated for 8 min. with no resistance. Constant monitoring was provided.   Neuromuscular re-education:   Pt. performed left hand FMC tasks using tip pinch to pick up 1/2" small circular tipped pegs, and placing them into a small flat pegboard. Pt. Utilized a 3pt. Grasp with red resistive clips to remove the pegs. Pt. Worked on combinations of movements by adding isolated forearm supination into the task while securely holding the peg in the clips. Pt. Required proprioceptive input through the hand on the tabletop during the task.        PATIENT EDUCATION: Education  details: left hand digit extension Person educated: Patient and Spouse Education method: Medical illustrator Education comprehension: verbalized understanding, returned demonstration, and needs further education     HOME EXERCISE PROGRAM:    Reviewed activities at home to promote isolated 2nd digit extension.    GOALS: Goals reviewed with patient? Yes   SHORT TERM GOALS: Target date:  07/09/2023         1. Patient will be independent with home exercise program for the left upper extremity Baseline: 05/28/2023: Pt. Requires assist 04/09/2023: Pt. Continues to consistently attempt to engage her hand at hand 02/01/2023: Pt. Consistently attempts to perform HEPs independently. No current home exercise program Goal status: Ongoing     LONG TERM GOALS: Target date:  08/20/2023     1.  Patient will improve left shoulder strength by 2 mm grades to be able to sustain UE's in elevation long enough to wash her hair.  Baseline: 05/28/2023: Left shoulder flexion: 3-/5, abduction: 3-/5. Pt. Has  an old left shoulder injury limiting progression with left shoulder strength 05/26/2023: Pt. continues to present with limitiations in sustaining LUE elevation long enough to perform hair care thoroughly 04/09/2023: Pt. is limited with sustaining LUE elevation long enough to perform hair care thoroughly. 03/03/2023: Left shoulder flexion: 3-/5, abduction: 3-/5 01/22/2023: Left shoulder flexion: 3-/5, abduction: 3-/5 Eval: Left shoulder flexion: 3/5, abduction: 3-/5 Goal status: Not met, Deferred   2.  Patient will improve left shoulder active abduction to be able to comb her hair Baseline: 05/28/2023: left shoulder flexion: 108 abduction: 85 Pt. Has an old left shoulder injury limiting progression with left shoulder ROM 05/26/2023:  5/10 left shoulder pain with ROM limits using it functionally during hair care. 04/09/2023: Pt. Presents with difficulty abducting her left shoulder enough to thoroughly complete  haircare. 03/03/2023: Shoulder abduction: 85 01/22/2023: Shoulder abduction: 85 Eval: Left shoulder abduction is 80(108) Goal status: Not met, Deferred   3.  Patient will independently button her shirt with modified independence. Baseline: 05/28/2023: pt. Continues to work towards progressing with buttoning 05/26/2023:  Pt. Continues to progress towards buttoning. 04/09/2023: Pt. Continues to progress towards buttoning. 03/03/2023: Pt. Continues to have difficulty with buttoning. 01/22/2023: Pt. continues to have difficulty. Eval: Patient has difficulty.  Goal status: Ongoing   4.  Patient well improve left grip strength in preparation for securely holding flowers. Baseline: 05/28/2023: Right: 26#, Left: 13#  pt. Presents with difficulty securely holding flowers in her left hand. 05/26/2023: TBD 04/09/2023: Pt. Is able to hold, and hike pants with the left hand, continues to have difficulty with securely holding flowers.   03/03/2023: left grip strength: 13# 01/22/2023: Left: 12# Eval: Pt. Is unable to securely hold flowers. Goal status: Ongoing   5.  Pt. will independently recall adaptive  strategies for performing ADL tasks including: flossing teeth, donning bra, applying makeup. Baseline: 05/28/2023: Pt. Needs continued education about adaptive strategies.05/26/2023: Continue 04/09/2023: Continue3/26/2024: Continue 01/22/2023: Pt. continues to benefit from education about adaptive strategies during ADLs, and IADLs. Eval: Pt. to be provided with adaptive strategies. Goal status: Ongoing   6.  Pt. will improve FOTO score by 2 points to reflect patient perceived performance improvement assessment specific ADLs  and IADLs Baseline: 6/57/8469: FOTO score 59 05/26/2023: TBD 5/02/204:  TBD 03/03/23: FOTO 62 Eval: 57 Goal status: Ongoing  7.  Pt. will improve left hand coordination skills in order to be able to handle, and sort utensils in a drawer.     Baseline: 05/28/2023: Left:  5 min. 05/26/2023 Pt. Continues to  present with difficulty handling and sorting utensils. 04/14/2023: 56 04/09/2023: TBD 03/03/23: 1 min. & 39 01/22/2022: Left: 3 min. & 4 sec. Eval: Pt. has difficulty sorting, and placing utensils with the left hand. Left FMC : >5 min. For 7 pegs on the 9 hole peg test.    Goal Status:  Ongoing  8. Pt. will improve active left 2nd digit extension to be able able to isolate her 2nd digit in preparation for pressing/pushing buttons on appliances, phones, or remotes. Baseline: 05/28/2023: Pt. Continues to improve with left 2nd digit extension 05/26/2023: Improving with left 2nd digit extension. 04/09/2023: Pt. Is progressing with isolating left 2nd digit extension, however continues to present with limited increased flexor tone. 03/03/23: Pt. Continues to work on improving consistency with 2nd digit extension to press the remote. 01/22/2023: is able to perform full digit extension, although 2nd digit is slow to extend s 2/2 flexor tone. Pt.  Eval: Pt. is able to  is unable to actively perform full digit extension Goal status:  Ongoing   ASSESSMENT:   CLINICAL IMPRESSION:  Pt. Tolerated all therapeutic exercise well. Pt. Was able to use tip pinch to pick up pegs and place them onto flat peg board however required increased time and verbal cues due to difficulties manipulating peg into correct position in hand to place it onto the peg board. Pt. Continues to require cues, and assist for hand placement for 3pt. pinch position on red resistive clip. Pt. Required proprioceptive input through the hand on the tabletop throughout completing the task. Pt continues to benefit from skilled OT services to work on improving L shoulder, elbow, and wrist strength, improving motor control and coordination in order to prepare the left upper extremity and hand for functional reaching and engaging the upper left extremity when performing hair care, buttoning, holding flowers, cutting food, and manipulating utensils to set the table as  well as adaptive strategies during daily IADL care.     PERFORMANCE DEFICITS in functional skills including ADLs, IADLs, coordination, proprioception, ROM, strength, FMC, and GMC, cognitive skills including memory, and psychosocial skills including coping strategies, environmental adaptation, interpersonal interactions, and routines and behaviors.    IMPAIRMENTS are limiting patient from ADLs, IADLs, education, leisure, and social participation.    COMORBIDITIES may have co-morbidities  that affects occupational performance. Patient will benefit from skilled OT to address above impairments and improve overall function.   MODIFICATION OR ASSISTANCE TO COMPLETE EVALUATION: Min-Moderate modification of tasks or assist with assess necessary to complete an evaluation.   OT OCCUPATIONAL PROFILE AND HISTORY: Detailed assessment: Review of records and additional review of physical, cognitive, psychosocial history related to current functional performance.   CLINICAL DECISION MAKING: Moderate - several treatment options, min-mod task modification necessary   REHAB POTENTIAL: Good   EVALUATION COMPLEXITY: Moderate      PLAN: OT FREQUENCY: 2x/week   OT DURATION: 12 weeks   PLANNED INTERVENTIONS: self care/ADL training, therapeutic exercise, therapeutic activity, neuromuscular re-education, manual therapy, passive range of motion, functional mobility training, electrical stimulation, and paraffin   RECOMMENDED OTHER SERVICES: PT   CONSULTED AND AGREED WITH PLAN OF CARE: Patient and family member/caregiver   PLAN FOR NEXT SESSION: Initiate OT treatment  Herma Carson, OTS 06/25/2023, 5:08 PM  This entire session was performed under the direct supervision and direction of a licensed therapist. I have personally read, edited, and approve of the note as written.   Olegario Messier, MS, OTR/L  06/25/2023

## 2023-06-30 ENCOUNTER — Ambulatory Visit: Payer: Medicare HMO | Admitting: Occupational Therapy

## 2023-06-30 ENCOUNTER — Ambulatory Visit: Payer: Medicare HMO | Admitting: Physical Therapy

## 2023-06-30 DIAGNOSIS — R2681 Unsteadiness on feet: Secondary | ICD-10-CM

## 2023-06-30 DIAGNOSIS — M6281 Muscle weakness (generalized): Secondary | ICD-10-CM

## 2023-06-30 DIAGNOSIS — R531 Weakness: Secondary | ICD-10-CM | POA: Diagnosis not present

## 2023-06-30 DIAGNOSIS — R278 Other lack of coordination: Secondary | ICD-10-CM | POA: Diagnosis not present

## 2023-06-30 DIAGNOSIS — R262 Difficulty in walking, not elsewhere classified: Secondary | ICD-10-CM

## 2023-06-30 NOTE — Therapy (Signed)
OUTPATIENT PHYSICAL THERAPY NEURO TREATMENT    Patient Name: [name] MRN: [id number] DOB:07-13-1941, 82 y.o., female Today's Date: 06/30/2023   PCP: Aram Beecham, D MD REFERRING PROVIDER: Jacquelynn Cree PA   END OF SESSION:  PT End of Session - 06/30/23 1545     Visit Number 12    Number of Visits 24    Date for PT Re-Evaluation 07/07/23    Authorization Type 3/10 eval 04/14/23    Progress Note Due on Visit 20    PT Start Time 1535    PT Stop Time 1615    PT Time Calculation (min) 40 min    Equipment Utilized During Treatment Gait belt    Activity Tolerance Patient tolerated treatment well;Patient limited by pain    Behavior During Therapy WFL for tasks assessed/performed                 Past Medical History:  Diagnosis Date   Anemia    Arthritis    Asthma    uses inhaler just prior to surgery to avoid attack   Back pain    from previous injury   Complication of anesthesia    has woken  up during 2 different surgery   Depression    no current issue/treatment; situation   Gallstones    GERD (gastroesophageal reflux disease)    Hiatal hernia    patient does NOT have nerve/muscle disease   History of kidney stones    HLD (hyperlipidemia)    HTN (hypertension)    Hypothyroidism    Kidney stones    Knee pain    Nausea and vomiting 10/15/2022   Non-diabetic pancreatic hormone dysfunction years   pt. 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   Pancreatitis    Pneumonia    Seizures (HCC)    caused by dye injected during a procedure   Shortness of breath    with exertion   Sinus problem    frequent infections/congestion   Stroke (HCC) 2021   reports having CVA in 2021 and having mini strokes before that   Thyroid disease    Past Surgical History:  Procedure Laterality Date   ABDOMINAL HYSTERECTOMY     APPENDECTOMY     CARPAL TUNNEL RELEASE  10+ years ago   bilateral   EYE SURGERY  3 yrs ago   bilateral cataracts   FOOT OSTEOTOMY  6 weeks  ago   Left foot: great, 2nd & 3rd   FOOT OSTEOTOMY  5 years ago   Right great toe   HAND SURGERY Bilateral 2011-most recent   multiple hand surgeries, 2 on left, 3 on right   KNEE ARTHROPLASTY Right 04/28/2022   Procedure: COMPUTER ASSISTED TOTAL KNEE ARTHROPLASTY;  Surgeon: Donato Heinz, MD;  Location: [facility];  Service: Orthopedics;  Laterality: Right;   LOOP RECORDER INSERTION N/A 05/16/2020   Procedure: LOOP RECORDER INSERTION;  Surgeon: Marcina Millard, MD;  Location: [facility];  Service: Cardiovascular;  Laterality: N/A;   NASAL SINUS SURGERY  most recent 7-8 yrs ago   7 sinus surgeries    TRIGGER FINGER RELEASE  11/19/2011   Procedure: RELEASE TRIGGER FINGER/A-1 PULLEY;  Surgeon: Nicki Reaper, MD;  Location: [facility];  Service: Orthopedics;  Laterality: Right;  release a-1 pulley right index finger and cyst removal   WRIST GANGLION EXCISION  1980's   right   Patient Active Problem List   Diagnosis Date Noted   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 11/27/2022   Hypomagnesemia 11/27/2022   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   HLD (hyperlipidemia) 11/23/2022   Asthma 11/23/2022   Depression with anxiety 11/23/2022   Chronic diastolic CHF (congestive heart failure) (HCC) 11/23/2022   Normocytic anemia 11/23/2022   Aspiration pneumonia (HCC) 11/23/2022   AKI (acute kidney injury) (HCC) 11/23/2022   Abdominal pain 11/23/2022   Mesenteric mass 11/23/2022   Coffee ground emesis 11/23/2022   Nausea and vomiting 10/15/2022   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   Fatigue 09/17/2022   Left hemiparesis (HCC) 09/17/2022   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HCC) 08/22/2022   GERD (gastroesophageal reflux disease) 08/21/2022   Agitation 08/20/2022   Acute left-sided weakness 08/20/2022   Expressive aphasia    Stroke (HCC) 08/19/2022   Leukocytosis 08/19/2022   History of urticaria 04/28/2022   Total knee replacement status  04/28/2022   Primary osteoarthritis of left knee 02/24/2022   Primary osteoarthritis of right knee 02/24/2022   Lumbar spondylolysis 04/12/2020   History of CVA (cerebrovascular accident) 03/26/2020   Low back pain radiating to right lower extremity 03/21/2020   B12 deficiency 03/06/2020   Positive anti-CCP test 12/21/2019   Arthralgia 12/13/2019   Dermatitis 12/13/2019   Rheumatoid factor positive 12/13/2019   Essential hypertension 12/11/2018   Palpitations 12/11/2018   Acquired hypothyroidism 11/10/2018   Arthritis of knee 09/17/2016   Anxiety 11/22/2014   Asthma without status asthmaticus 11/22/2014   Benign neoplasm of colon, unspecified 11/22/2014   Environmental allergies 11/22/2014   Hypertriglyceridemia 11/22/2014   Hypokalemia 11/22/2014   Personal history of disease of skin and subcutaneous tissue 11/22/2014    ONSET DATE: 11/23/22  REFERRING DIAG: TIA  THERAPY DIAG:  Muscle weakness (generalized)  Unsteadiness on feet  Difficulty in walking, not elsewhere classified  Other lack of coordination  Acute left-sided weakness  Rationale for Evaluation and Treatment: Rehabilitation  SUBJECTIVE:                                                                                                                                                                                             SUBJECTIVE STATEMENT: Pt reports that she is doing well. States that she increased fluid and food intake prior to PT treatment on this day.   Pt accompanied by: significant other  PERTINENT HISTORY: Patient is an 82 year-old female who was admitted to [facility] on 11/23/2022 with a cerebellar CVA. 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 Pt. Was admitted to Inpatient Rehab from 12/21-1/02/2023. 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  Patient underwent inpatient rehabilitation for  2 weeks.  Patient was  assessed and was scheduled for a knee replacement on 08/29/2022 however had to cancel it due to having had a CVA.   PMH includes: anemia, arthritis, back pain, depression, hernia, GERD, HLD, HTN, hypothyroidism, knee pain, pancreatic hormone dysfunction, pneumonia, seizures, stroke, thyroid disease.  Patient has a donjoy brace for L knee. Walking with hand hold help with husband.   PAIN:  Are you having pain? Yes: NPRS scale: 0/10 Pain location: L knee Pain description: aching, gives out Aggravating factors: climbing into pain, moving it, walking around house Relieving factors: rest   PRECAUTIONS: Fall, has latex allergy  WEIGHT BEARING RESTRICTIONS: No  FALLS: Has patient fallen in last 6 months? Yes. Number of falls 4 falls   LIVING ENVIRONMENT: Lives with: lives with their family and lives with their spouse Lives in: House/apartment Stairs: Yes: Internal: flight steps;   and External: 2 steps; on right going up and on left going up Has following equipment at home: Single point cane, Walker - 2 wheeled, Shower bench, and bed side commode  PLOF: Independent  PATIENT GOALS: to have less pain and move better  OBJECTIVE:   DIAGNOSTIC FINDINGS:  Disc levels:   C2-3: No significant stenosis.   C3-4: A rightward disc osteophyte complex effaces the ventral CSF. Severe left and moderate right foraminal stenosis is present.   C4-5: A broad-based disc osteophyte complex effaces the ventral CSF. The canal is narrowed 7 mm. Severe left and moderate right foraminal stenosis is present.   C5-6: Chronic loss of disc height is present. Broad-based disc osteophyte complex is asymmetric to the right. Partial effacement of the ventral CSF is present. Moderate foraminal stenosis is worse right than left.   C6-7: A broad-based disc osteophyte complex is present. This partially effaces the ventral CSF. Moderate foraminal narrowing is worse right than left.   C7-T1: Uncovertebral spurring is  present bilaterally. Mild bilateral foraminal stenosis is present.   IMPRESSION: 1. Multilevel spondylosis of the cervical spine as described. 2. 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 3. 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 4. No other significant cord signal abnormality to suggest ischemic changes to the cord. 5. Severe left and moderate right foraminal stenosis at C3-4 and C4-5. 6. Moderate foraminal stenosis bilaterally at C5-6 and C6-7 is worse on the right. 7. Mild bilateral foraminal narrowing at C7-T1. 8. Prominent soft tissue pannus at C1-2 effaces the ventral CSF. 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  IMPRESSION: 1. 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 2. 9 mm right occipital pole infarct. 3. Punctate white matter infarct in the left occipital lobe. 4. Additional punctate cortical infarct in the medial right occipital lobe more superiorly. 5. Expected evolution of previous right posterior frontal lobe white matter infarct. 6. Stable atrophy and white matter disease likely reflects the sequela of chronic microvascular ischemia.  COGNITION: Overall cognitive status:  two strokes   SENSATION: WFL  COORDINATION: Heel slide test limited by LLE pain  POSTURE: rounded shoulders, forward head, and flexed trunk   LOWER EXTREMITY ROM:     Bowing of L knee  LOWER EXTREMITY MMT:    MMT Right Eval Left Eval R 7/16 L 7/16  Hip flexion 3 3* 3+ 3+  Hip extension      Hip abduction 3 3* 4- 4-  Hip adduction 3 * 4-  4-  Hip internal rotation      Hip external rotation      Knee flexion 3 3* 4- 4-  Knee extension 3 3* 4- 4-  Ankle dorsiflexion 4 4 4 4   Ankle plantarflexion 4 4 4 4   (Blank rows = not tested) * pain  BED MOBILITY:  Per patient she has difficulty,  occasionally needs help  TRANSFERS: Assistive device utilized: None  Sit to stand: CGA and Min A Stand to sit: Min A Chair to chair: CGA    GAIT: Gait pattern: step to pattern, decreased stance time- Left, genu recurvatum- Left, and shuffling Distance walked: 10 ft Assistive device utilized: None Level of assistance: CGA Comments: Patient is very painful with ambulation, unable to ambulate longer duration   FUNCTIONAL TESTS:  5 times sit to stand: unable to tolerate 10 meter walk test: unable to tolerate  PATIENT SURVEYS:  FOTO 47  TODAY'S TREATMENT:                                                                                                                              DATE: 06/30/23     Nustep: 3 min BLE only.  Cues for pain-free range of motion.  Min assist from a PT to prevent knee hyperextension and excessive flexion.  BP assessment:  Sitting 123/52 HR 72.  0 min Standing 109/58 73 mildly symptomatic initially but improved after 10 seconds. 1 min Standing 106/69 85 Sitting 113/60 HR 70  Sit<>stand 2 x 5 with UE supported on RW.  Therapeutic rest break between bouts with cues for improved bilateral lower extremity positioning.  Gait with RW around cone at 52ft x 4( two to the R and 2 to the L) contact-guard assist provided from PT for safety.  Min to moderate multimodal cues for improved visual scanning to the left to locate obstacles.  Seated therex:  Long arc quad right lower extremity red Thera-Band x 12; left lower extremity active range of motion x 12 Hip abduction yellow Thera-Band x 12 bilateral Ankle plantarflexion yellow Thera-Band x 12 bilateral Hip flexion with 3-second hold 2.5 pound ankle weights x 12 bilateral Hamstring curl yellow Thera-Band x 12 bilateral  Throughout session patient denies pain in the left lower extremity.   PATIENT EDUCATION: Education details: Pt educated throughout session about proper posture and technique with exercises.  Improved exercise technique, movement at target joints, use of target muscles after min to mod verbal, visual, tactile cues.  Importance of nutrition and hydration prior to PT treatment to prevent orthostatic hypotension from low fluid volumes.   Person educated: Patient and Spouse Education method: Explanation, Demonstration, Tactile cues, and Verbal cues Education comprehension: verbalized understanding, returned demonstration, verbal cues required, and tactile cues required  HOME EXERCISE PROGRAM: continue HEP as previously given  Access Code: [email or web address] URL: [email or web address] Date: 06/23/2023 Prepared by: Grier Rocher  Exercises - Standing March with Counter Support  - 1 x daily - 7  x weekly - 2 sets - 10 reps - Standing Hip Abduction with Unilateral Counter Support  - 1 x daily - 7 x weekly - 2 sets - 8 reps  Access Code: [email or web address] URL: [email or web address] Date: 04/14/2023 Prepared by: Precious Bard  Exercises - Seated Single Leg Hip Abduction  - 1 x daily - 7 x weekly - 2 sets - 10 reps - 5 hold - Seated Heel Slide  - 1 x daily - 7 x weekly - 2 sets - 10 reps - 5 hold - Seated Heel Toe Raises  - 1 x daily - 7 x weekly - 2 sets - 10 reps - 5 hold  GOALS: Goals reviewed with patient? Yes  SHORT TERM GOALS: Target date: 05/12/2023    Patient will be independent in home exercise program to improve strength/mobility for better functional independence with ADLs. Baseline: 5/7:  Goal status: INITIAL    LONG TERM GOALS: Target date: 07/07/2023    Patient will increase FOTO score to equal to or greater than   57%  to demonstrate statistically significant improvement in mobility and quality of life.  Baseline:  47%  7/16: 60%  Goal status: INITIAL  2.  Patient (> 32 years old) will complete five times sit to stand test in < 15 seconds indicating an increased LE strength and improved balance. Baseline: 5/7: unable to tolerate test  7/16:  17.4sec  Goal status: IN PROGRESS  3.   Patient will deny any falls over past 4 weeks to demonstrate improved safety awareness at home and work.  Baseline: 5/7: multiple falls   1 fall from bed last week.  Goal status: IN PROGRESS  4.  Patient will increase BLE gross strength to 4+/5 as to improve functional strength for independent gait, increased standing tolerance and increased ADL ability Baseline: 5/7: see above  7/16: improved to grossly 4-/5 overall  Goal status: INITIAL  5.  Patient will be able to perform household work/ chores without increase in symptoms. Baseline: 5/7: unable to perform   7/16: Pt reports that she has returned to doing some housework again, but reports that she is performing from [facility].  Goal status: IN PROGRESS  6. Pt will increase gait speed to >0.8 m/s to indicate reduced fall risk and improved safety with house hold level ambulation.  Baseline: 7/16: 0.480m/s  Goal status: New      ASSESSMENT:  CLINICAL IMPRESSION:  Pt presents to physical therapy session motivated to participate in interventions.  Patient reports increased pain in the left knee this morning, but denies pain throughout therapy session on this day.  PT treatment focused on improved tolerance to standing and bilateral lower extremity strengthening.  Patient reports mild orthostatic signs and symptoms initially in standing but improves with time mild drop in blood pressure noted on this day but stabilized and standing.  Cues required for obstacle navigation with gait training on the left side due to vision loss from CVA.  The patient will benefit from skilled physical therapy to reduce pain, improve mobility, improve strength and improve quality of life.     OBJECTIVE IMPAIRMENTS: Abnormal gait, cardiopulmonary status limiting activity, decreased activity tolerance, decreased balance, decreased cognition, decreased endurance, decreased knowledge of use of DME, decreased mobility, difficulty  walking, decreased ROM, decreased strength, decreased safety awareness, hypomobility, increased fascial restrictions, impaired perceived functional ability, impaired flexibility, impaired UE functional use, improper body mechanics, postural dysfunction, and pain.   ACTIVITY LIMITATIONS: carrying, lifting, bending, sitting, standing, squatting, stairs,  transfers, bed mobility, bathing, toileting, dressing, reach over head, hygiene/grooming, locomotion level, and caring for others  PARTICIPATION LIMITATIONS: meal prep, cleaning, laundry, medication management, personal finances, interpersonal relationship, driving, shopping, community activity, yard work, school, and church  PERSONAL FACTORS: Age, Behavior pattern, Education, Fitness, Past/current experiences, Time since onset of injury/illness/exacerbation, and 3+ comorbidities: anemia, arthritis, back pain, depression, hernia, GERD, HLD, HTN, hypothyroidism, knee pain, pancreatic hormone dysfunction, pneumonia, seizures, stroke, thyroid disease  are also affecting patient's functional outcome.   REHAB POTENTIAL: Fair    CLINICAL DECISION MAKING: Evolving/moderate complexity  EVALUATION COMPLEXITY: Moderate  PLAN:  PT FREQUENCY: 1-2x/week  PT DURATION: 12 weeks  PLANNED INTERVENTIONS: Therapeutic exercises, Therapeutic activity, Neuromuscular re-education, Balance training, Gait training, Patient/Family education, Self Care, Joint mobilization, Joint manipulation, Stair training, Vestibular training, Canalith repositioning, Visual/preceptual remediation/compensation, Orthotic/Fit training, DME instructions, Electrical stimulation, Wheelchair mobility training, Spinal mobilization, Cryotherapy, Moist heat, Compression bandaging, scar mobilization, Splintting, Taping, Traction, Ultrasound, Manual therapy, and Re-evaluation  PLAN FOR NEXT SESSION:  Standing balance and gait as tolerated   seated and supine strengthening of LLE,   Golden Pop, PT 06/30/2023, 5:22 PM

## 2023-06-30 NOTE — Therapy (Signed)
Occupational Therapy Neuro Treatment Note   Patient Name: [name] MRN: [id number] DOB:February 20, 1941, 82 y.o., female Today's Date: 06/30/2023  PCP: Dr. Judithann Sheen REFERRING PROVIDER:  Dr. Judithann Sheen  END OF SESSION:  PLAN:   OT End of Session - 06/30/23 1708     Visit Number [id number]    Number of Visits 72    Date for OT Re-Evaluation 08/20/23    Authorization Time Period Progress report period starting 12/16/2022    OT Start Time 1615    OT Stop Time 1700    OT Time Calculation (min) 45 min    Activity Tolerance Patient tolerated treatment well    Behavior During Therapy WFL for tasks assessed/performed                         Past Medical History:  Diagnosis Date   Anemia    Arthritis    Asthma    uses inhaler just prior to surgery to avoid attack   Back pain    from previous injury   Complication of anesthesia    has woken  up during 2 different surgery   Depression    no current issue/treatment; situation   Gallstones    GERD (gastroesophageal reflux disease)    Hiatal hernia    patient does NOT have nerve/muscle disease   History of kidney stones    HLD (hyperlipidemia)    HTN (hypertension)    Hypothyroidism    Kidney stones    Knee pain    Nausea and vomiting 10/15/2022   Non-diabetic pancreatic hormone dysfunction years   pt. 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   Pancreatitis    Pneumonia    Seizures (HCC)    caused by dye injected during a procedure   Shortness of breath    with exertion   Sinus problem    frequent infections/congestion   Stroke (HCC) 2021   reports having CVA in 2021 and having mini strokes before that   Thyroid disease    Past Surgical History:  Procedure Laterality Date   ABDOMINAL HYSTERECTOMY     APPENDECTOMY     CARPAL TUNNEL RELEASE  10+ years ago   bilateral   EYE SURGERY  3 yrs ago   bilateral cataracts   FOOT OSTEOTOMY  6 weeks ago   Left foot: great, 2nd & 3rd   FOOT OSTEOTOMY  5 years ago   Right  great toe   HAND SURGERY Bilateral 2011-most recent   multiple hand surgeries, 2 on left, 3 on right   KNEE ARTHROPLASTY Right 04/28/2022   Procedure: COMPUTER ASSISTED TOTAL KNEE ARTHROPLASTY;  Surgeon: Donato Heinz, MD;  Location: [facility];  Service: Orthopedics;  Laterality: Right;   LOOP RECORDER INSERTION N/A 05/16/2020   Procedure: LOOP RECORDER INSERTION;  Surgeon: Marcina Millard, MD;  Location: [facility];  Service: Cardiovascular;  Laterality: N/A;   NASAL SINUS SURGERY  most recent 7-8 yrs ago   7 sinus surgeries    TRIGGER FINGER RELEASE  11/19/2011   Procedure: RELEASE TRIGGER FINGER/A-1 PULLEY;  Surgeon: Nicki Reaper, MD;  Location: [facility];  Service: Orthopedics;  Laterality: Right;  release a-1 pulley right index finger and cyst removal   WRIST GANGLION EXCISION  1980's   right   Patient Active Problem List   Diagnosis Date Noted   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 11/27/2022   Hypomagnesemia 11/27/2022   Occlusion of right  vertebral artery 11/23/2022   HLD (hyperlipidemia) 11/23/2022   Asthma 11/23/2022   Depression with anxiety 11/23/2022   Chronic diastolic CHF (congestive heart failure) (HCC) 11/23/2022   Normocytic anemia 11/23/2022   Aspiration pneumonia (HCC) 11/23/2022   AKI (acute kidney injury) (HCC) 11/23/2022   Abdominal pain 11/23/2022   Mesenteric mass 11/23/2022   Coffee ground emesis 11/23/2022   Nausea and vomiting 10/15/2022   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   Fatigue 09/17/2022   Left hemiparesis (HCC) 09/17/2022   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HCC) 08/22/2022   GERD (gastroesophageal reflux disease) 08/21/2022   Agitation 08/20/2022   Acute left-sided weakness 08/20/2022   Expressive aphasia    Stroke (HCC) 08/19/2022   Leukocytosis 08/19/2022   History of urticaria 04/28/2022   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   Primary osteoarthritis of left knee 02/24/2022   Primary  osteoarthritis of right knee 02/24/2022   Lumbar spondylolysis 04/12/2020   History of CVA (cerebrovascular accident) 03/26/2020   Low back pain radiating to right lower extremity 03/21/2020   B12 deficiency 03/06/2020   Positive anti-CCP test 12/21/2019   Arthralgia 12/13/2019   Dermatitis 12/13/2019   Rheumatoid factor positive 12/13/2019   Essential hypertension 12/11/2018   Palpitations 12/11/2018   Acquired hypothyroidism 11/10/2018   Arthritis of knee 09/17/2016   Anxiety 11/22/2014   Asthma without status asthmaticus 11/22/2014   Benign neoplasm of colon, unspecified 11/22/2014   Environmental allergies 11/22/2014   Hypertriglyceridemia 11/22/2014   Hypokalemia 11/22/2014   Personal history of disease of skin and subcutaneous tissue 11/22/2014   REFERRING DIAG: CVA   THERAPY DIAG:  Muscle weakness (generalized)   Other lack of coordination   Rationale for Evaluation and Treatment Rehabilitation   SUBJECTIVE:   Pt. presents with an area of swelling on the dorsum of the right hand following the cat bite. Pt. Reports that bite did not break the skin.   SUBJECTIVE STATEMENT:   Pt accompanied by: significant other   PERTINENT HISTORY: Patient is an 82 year-old female who was admitted to [facility] on 11/23/2022 with a cerebellar CVA. 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 Pt. Was admitted to Inpatient Rehab from 12/21-1/02/2023. 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  Patient underwent inpatient rehabilitation for 2 weeks.  Patient was assessed and was scheduled for a knee replacement on 08/29/2022 however had to cancel it due to having had a CVA.  Patient had a recent fall 2 days after discharging from inpatient rehab. Past Medical History includes: Knee replacement, essential HTN, hypokalemia, leukocytosis, seizures, positive anti-- CCP test, anxiety disorder, mini strokes.  Patient had  shingles with left eye nerve pain s/p 1 year ago.    PRECAUTIONS: Fall   WEIGHT BEARING RESTRICTIONS No   PAIN:  Are you having pain? 1/10 pain in LUE   FALLS: Has patient fallen in last 6 months? Yes. Number of falls 1   LIVING ENVIRONMENT: Lives with: Lives with Spouse Lives in: House/apartment Stairs: 2 storey home, resides on the first floor.  External: 2 stairs front no rails, and 6 in back with rails Has following equipment at home: Single point cane, Walker - 2 wheeled, Environmental consultant - 4 wheeled, Shower bench, and bed side commode   PLOF: Independent   PATIENT GOALS  To Regain the use of her left arm   OBJECTIVE:    HAND DOMINANCE: Right   ADLs: Overall ADLs: Husband assists pt. as needed Transfers/ambulation related to ADLs:Pt. Uses  a 3 wheeled walker with Husband assist. Eating: Pt. Is independent with the right hand. Pt. has difficulty cutting food. Grooming: Pt. is using her right hand, however has difficulty sustaining her LUE in elevation to assist with haircare. UB Dressing: Pt. Is independent donning a pullover shirt, and button down shirt. Has difficulty with buttoning, LB Dressing:  Independent donning pants, and socks. Difficulty tying shoes. Toileting: Independent Bathing: Pt. Is able to engage her right hand. Tub Shower transfers: Supervision Equipment: See above for equipment     IADLs: Shopping:  Has not had the opportunity for grocery shopping yet Light housekeeping: Husband is assisting with light house keeping Meal Prep:  Dependent Community mobility: Relies of family/friends Medication management: Husband assisting with weekly pillbox set-up, and administering medication. Financial management: TBD Handwriting: 75% legible   MOBILITY STATUS: Hx of falls   POSTURE COMMENTS:  No Significant postural limitations Sitting balance: supported sitting balance WFL   ACTIVITY TOLERANCE: Activity tolerance:  Fatigues in greater than 30 min.    FUNCTIONAL  OUTCOME MEASURES: FOTO: 57   UPPER EXTREMITY ROM      Active ROM Right Eval: WFL Left eval Left  01/22/23 Left  03/03/23 Left  04/14/23 Left  05/28/2023  Shoulder flexion   132 100 108 108 108  Shoulder abduction   80 85 85 85 85  Shoulder adduction          Shoulder extension          Shoulder internal rotation          Shoulder external rotation          Elbow flexion   140 140 WFL WFL WFL  Elbow extension   WNL WNL [facility] WFL WFL  Wrist flexion   65 68     Wrist extension   -10 20 24  32 32  Wrist ulnar deviation     12 10 14 14   Wrist radial deviation     8 14 14 16   Wrist pronation          Wrist supination          (Blank rows = not tested)   Left digit flexion to [facility]: 2nd: 0cm, 3rd: 0cm, 4th: 0cm, 5th: 0cm   Limited Left full 2nd digit extension     UPPER EXTREMITY MMT:      MMT Right Eval: 4+/5 overall Left Eval Left 01/22/23 Left 03/03/23 Left  04/14/2023 Left 05/28/2023  Shoulder flexion   3/5 3-/5 3-/5 3-/5 3-/5  Shoulder abduction   3-/5 3-/5 3-/5 3-/5 3-/5  Shoulder adduction          Shoulder extension          Shoulder internal rotation          Shoulder external rotation          Middle trapezius          Lower trapezius          Elbow flexion   3+/5 4/5 N/A 4/5 4/5  Elbow extension   3+/5 4/4 N/A 4/5 4/5  Wrist flexion          Wrist extension   2-/5 3-/5 3-/5 3/5 3+/5  Wrist ulnar deviation          Wrist radial deviation          Wrist pronation          Wrist supination          (Blank rows = not  tested)   HAND FUNCTION: Grip strength: Right: 26#, Left: 10# Pinch strength: Right 8#, Left: 3#, 3 Pt. Pinch strength: Right: 9#, L: 2#  01/22/2023 Grip strength: Right: 26#, Left: 12# Pinch strength:  Pinch meter used at the initial eval has been sent out for recalibration   03/03/2023 Grip strength: Right: 26#, Left: 13# Pinch strength:  Pinch meter used at the initial eval has been sent out for recalibration  04/14/2023: Grip strength: Right:  26#, Left: 13# Pinch strength:   Right 8#, Left: 4#, 3 Pt. Pinch strength: Right: 9#, L: 3#    05/28/2023: Grip strength: Right: 26#, Left: 13# Pinch strength:   Right 8#, Left: 2#, 3 Pt. Pinch strength: Right: 9#, L: 4#     COORDINATION: Right: 22 sec., Left: <5 min. To place 7 pegs with increased compensation proximally in the trunk, and through reflexive associated reactions.  01/22/23 Right: 22 sec., Left: 3 min. & 4 sec.  03/03/23 Right: 22 sec., Left: 1 min. & 39 sec.  04/14/23   TBD  05/28/23 Right: 22 sec., Left:  5 min.      SENSATION: Light touch: WFL, proprioceptive awareness: Intact   EDEMA: N/A   MUSCLE TONE: LUE: Hypotonic   COGNITION: Overall cognitive status: WFL for tasks assessed. Pt. Is impulsive at times.   VISION: Subjective report: Pt. report having shingles affecting left eye  s/p 1 year. Has nerve pain Baseline vision: Wears glasses for reading only Visual history:  updated see clinical impression   VISION ASSESSMENT:    WFL for tasks performed   PERCEPTION: Intact   PRAXIS: Impaired: Motor planning   OBSERVATIONS:  Pt. more alert, and engaging since prior to the most recent hospitalization.     TODAY'S TREATMENT:    Therapeutic Ex.   Pt. tolerated AROM/AAROM/PROM in the left shoulder. Pt. worked on [employer], and reciprocal motion using the UBE while seated for 8 min. with no resistance. Constant monitoring was provided.   Neuromuscular re-education:   Pt. performed left hand [facility] skills grasping 1/8" small cubes with the 2nd digit, and thumb. Pt. worked on extending her wrist while holding the cube, then stacking the cubes in a vertical tower with Dycem on the base. Pt. worked on grasping the 1/8" small cubes with the left hand, and threading them onto a small dowel held in the right hand at midline to incorporate bilateral hand coordination.    Manual Therapy:  Pt. tolerated soft tissue massage to the scapular, and shoulder  musculature. Pt. performed scapular mobilizations for scapular elevation, depression, abduction/adduction in sitting . Manual therapy was performed independent of, and in preparation for ROM, there. Ex, and functional hand use.       PATIENT EDUCATION: Education details: left hand digit extension Person educated: Patient and Spouse Education method: Medical illustrator Education comprehension: verbalized understanding, returned demonstration, and needs further education     HOME EXERCISE PROGRAM:    Reviewed activities at home to promote isolated 2nd digit extension.    GOALS: Goals reviewed with patient? Yes   SHORT TERM GOALS: Target date:  07/09/2023         1. Patient will be independent with home exercise program for the left upper extremity Baseline: 05/28/2023: Pt. Requires assist 04/09/2023: Pt. Continues to consistently attempt to engage her hand at hand 02/01/2023: Pt. Consistently attempts to perform HEPs independently. No current home exercise program Goal status: Ongoing     LONG TERM GOALS: Target date:  08/20/2023  1.  Patient will improve left shoulder strength by 2 mm grades to be able to sustain UE's in elevation long enough to wash her hair.  Baseline: 05/28/2023: Left shoulder flexion: 3-/5, abduction: 3-/5. Pt. Has an old left shoulder injury limiting progression with left shoulder strength 05/26/2023: Pt. continues to present with limitiations in sustaining LUE elevation long enough to perform hair care thoroughly 04/09/2023: Pt. is limited with sustaining LUE elevation long enough to perform hair care thoroughly. 03/03/2023: Left shoulder flexion: 3-/5, abduction: 3-/5 01/22/2023: Left shoulder flexion: 3-/5, abduction: 3-/5 Eval: Left shoulder flexion: 3/5, abduction: 3-/5 Goal status: Not met, Deferred   2.  Patient will improve left shoulder active abduction to be able to comb her hair Baseline: 05/28/2023: left shoulder flexion: 108 abduction: 85 Pt. Has  an old left shoulder injury limiting progression with left shoulder ROM 05/26/2023:  5/10 left shoulder pain with ROM limits using it functionally during hair care. 04/09/2023: Pt. Presents with difficulty abducting her left shoulder enough to thoroughly complete haircare. 03/03/2023: Shoulder abduction: 85 01/22/2023: Shoulder abduction: 85 Eval: Left shoulder abduction is 80(108) Goal status: Not met, Deferred   3.  Patient will independently button her shirt with modified independence. Baseline: 05/28/2023: pt. Continues to work towards progressing with buttoning 05/26/2023:  Pt. Continues to progress towards buttoning. 04/09/2023: Pt. Continues to progress towards buttoning. 03/03/2023: Pt. Continues to have difficulty with buttoning. 01/22/2023: Pt. continues to have difficulty. Eval: Patient has difficulty.  Goal status: Ongoing   4.  Patient well improve left grip strength in preparation for securely holding flowers. Baseline: 05/28/2023: Right: 26#, Left: 13#  pt. Presents with difficulty securely holding flowers in her left hand. 05/26/2023: TBD 04/09/2023: Pt. Is able to hold, and hike pants with the left hand, continues to have difficulty with securely holding flowers.   03/03/2023: left grip strength: 13# 01/22/2023: Left: 12# Eval: Pt. Is unable to securely hold flowers. Goal status: Ongoing   5.  Pt. will independently recall adaptive  strategies for performing ADL tasks including: flossing teeth, donning bra, applying makeup. Baseline: 05/28/2023: Pt. Needs continued education about adaptive strategies.05/26/2023: Continue 04/09/2023: Continue3/26/2024: Continue 01/22/2023: Pt. continues to benefit from education about adaptive strategies during ADLs, and IADLs. Eval: Pt. to be provided with adaptive strategies. Goal status: Ongoing   6.  Pt. will improve FOTO score by 2 points to reflect patient perceived performance improvement assessment specific ADLs  and IADLs Baseline: 1/61/0960: FOTO score 59  05/26/2023: TBD 5/02/204:  TBD 03/03/23: FOTO 62 Eval: 57 Goal status: Ongoing  7.  Pt. will improve left hand coordination skills in order to be able to handle, and sort utensils in a drawer.     Baseline: 05/28/2023: Left:  5 min. 05/26/2023 Pt. Continues to present with difficulty handling and sorting utensils. 04/14/2023: 56 04/09/2023: TBD 03/03/23: 1 min. & 39 01/22/2022: Left: 3 min. & 4 sec. Eval: Pt. has difficulty sorting, and placing utensils with the left hand. Left FMC : >5 min. For 7 pegs on the 9 hole peg test.    Goal Status:  Ongoing  8. Pt. will improve active left 2nd digit extension to be able able to isolate her 2nd digit in preparation for pressing/pushing buttons on appliances, phones, or remotes. Baseline: 05/28/2023: Pt. Continues to improve with left 2nd digit extension 05/26/2023: Improving with left 2nd digit extension. 04/09/2023: Pt. Is progressing with isolating left 2nd digit extension, however continues to present with limited increased flexor tone. 03/03/23: Pt. Continues to work  on improving consistency with 2nd digit extension to press the remote. 01/22/2023: is able to perform full digit extension, although 2nd digit is slow to extend s 2/2 flexor tone. Pt.  Eval: Pt. is able to is unable to actively perform full digit extension Goal status:  Ongoing   ASSESSMENT:   CLINICAL IMPRESSION:  Pt. is improving with left hand [facility] skills. Pt. reports using her left hand automatically to grasp a pill at home, and to fasten a hair comb in her hair.Pt. reports that this surprised her. Pt. reports no pain through available left shoulder ROM, however has 8/10 pain at the end ranges. Pt. Is able to grasp 1/8" cubes, and stack a tower of 2 on a stabilized surface. Pt. continues to benefit from skilled OT services to work on improving L shoulder, elbow, and wrist strength, improving motor control and coordination in order to prepare the left upper extremity and hand for functional reaching and  engaging the upper left extremity when performing hair care, buttoning, holding flowers, cutting food, and manipulating utensils to set the table as well as adaptive strategies during daily IADL care.     PERFORMANCE DEFICITS in functional skills including ADLs, IADLs, coordination, proprioception, ROM, strength, FMC, and GMC, cognitive skills including memory, and psychosocial skills including coping strategies, environmental adaptation, interpersonal interactions, and routines and behaviors.    IMPAIRMENTS are limiting patient from ADLs, IADLs, education, leisure, and social participation.    COMORBIDITIES may have co-morbidities  that affects occupational performance. Patient will benefit from skilled OT to address above impairments and improve overall function.   MODIFICATION OR ASSISTANCE TO COMPLETE EVALUATION: Min-Moderate modification of tasks or assist with assess necessary to complete an evaluation.   OT OCCUPATIONAL PROFILE AND HISTORY: Detailed assessment: Review of records and additional review of physical, cognitive, psychosocial history related to current functional performance.   CLINICAL DECISION MAKING: Moderate - several treatment options, min-mod task modification necessary   REHAB POTENTIAL: Good   EVALUATION COMPLEXITY: Moderate      PLAN: OT FREQUENCY: 2x/week   OT DURATION: 12 weeks   PLANNED INTERVENTIONS: self care/ADL training, therapeutic exercise, therapeutic activity, neuromuscular re-education, manual therapy, passive range of motion, functional mobility training, electrical stimulation, and paraffin   RECOMMENDED OTHER SERVICES: PT   CONSULTED AND AGREED WITH PLAN OF CARE: Patient and family member/caregiver   PLAN FOR NEXT SESSION: Initiate OT treatment  Olegario Messier, MS, OTR/L  06/30/2023

## 2023-07-02 ENCOUNTER — Ambulatory Visit: Payer: Medicare HMO | Admitting: Occupational Therapy

## 2023-07-02 DIAGNOSIS — R262 Difficulty in walking, not elsewhere classified: Secondary | ICD-10-CM | POA: Diagnosis not present

## 2023-07-02 DIAGNOSIS — M6281 Muscle weakness (generalized): Secondary | ICD-10-CM | POA: Diagnosis not present

## 2023-07-02 DIAGNOSIS — R2681 Unsteadiness on feet: Secondary | ICD-10-CM | POA: Diagnosis not present

## 2023-07-02 DIAGNOSIS — R278 Other lack of coordination: Secondary | ICD-10-CM

## 2023-07-02 DIAGNOSIS — R531 Weakness: Secondary | ICD-10-CM | POA: Diagnosis not present

## 2023-07-02 NOTE — Therapy (Addendum)
Occupational Therapy Neuro Treatment Note   Patient Name: [name] MRN: [id number] DOB:[birth year], 82 y.o., female Today's Date: 07/02/2023  PCP: Dr. Judithann Sheen REFERRING PROVIDER:  Dr. Judithann Sheen  END OF SESSION:  PLAN:   OT End of Session - 07/02/23 1624     Visit Number 49    Number of Visits 72    Date for OT Re-Evaluation 08/20/23    OT Start Time 1530    OT Stop Time 1615    OT Time Calculation (min) 45 min    Equipment Utilized During Treatment Wheelchair    Activity Tolerance Patient tolerated treatment well    Behavior During Therapy WFL for tasks assessed/performed                         Past Medical History:  Diagnosis Date   Anemia    Arthritis    Asthma    uses inhaler just prior to surgery to avoid attack   Back pain    from previous injury   Complication of anesthesia    has woken  up during 2 different surgery   Depression    no current issue/treatment; situation   Gallstones    GERD (gastroesophageal reflux disease)    Hiatal hernia    patient does NOT have nerve/muscle disease   History of kidney stones    HLD (hyperlipidemia)    HTN (hypertension)    Hypothyroidism    Kidney stones    Knee pain    Nausea and vomiting 10/15/2022   Non-diabetic pancreatic hormone dysfunction years   pt. 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   Pancreatitis    Pneumonia    Seizures (HCC)    caused by dye injected during a procedure   Shortness of breath    with exertion   Sinus problem    frequent infections/congestion   Stroke (HCC) 2021   reports having CVA in 2021 and having mini strokes before that   Thyroid disease    Past Surgical History:  Procedure Laterality Date   ABDOMINAL HYSTERECTOMY     APPENDECTOMY     CARPAL TUNNEL RELEASE  10+ years ago   bilateral   EYE SURGERY  3 yrs ago   bilateral cataracts   FOOT OSTEOTOMY  6 weeks ago   Left foot: great, 2nd & 3rd   FOOT OSTEOTOMY  5 years ago   Right great toe   HAND SURGERY  Bilateral 2011-most recent   multiple hand surgeries, 2 on left, 3 on right   KNEE ARTHROPLASTY Right 04/28/2022   Procedure: COMPUTER ASSISTED TOTAL KNEE ARTHROPLASTY;  Surgeon: Donato Heinz, MD;  Location: [facility];  Service: Orthopedics;  Laterality: Right;   LOOP RECORDER INSERTION N/A 05/16/2020   Procedure: LOOP RECORDER INSERTION;  Surgeon: Marcina Millard, MD;  Location: [facility];  Service: Cardiovascular;  Laterality: N/A;   NASAL SINUS SURGERY  most recent 7-8 yrs ago   7 sinus surgeries    TRIGGER FINGER RELEASE  11/19/2011   Procedure: RELEASE TRIGGER FINGER/A-1 PULLEY;  Surgeon: Nicki Reaper, MD;  Location: [facility];  Service: Orthopedics;  Laterality: Right;  release a-1 pulley right index finger and cyst removal   WRIST GANGLION EXCISION  1980's   right   Patient Active Problem List   Diagnosis Date Noted   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 11/27/2022   Hypomagnesemia 11/27/2022   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  HLD (hyperlipidemia) 11/23/2022   Asthma 11/23/2022   Depression with anxiety 11/23/2022   Chronic diastolic CHF (congestive heart failure) (HCC) 11/23/2022   Normocytic anemia 11/23/2022   Aspiration pneumonia (HCC) 11/23/2022   AKI (acute kidney injury) (HCC) 11/23/2022   Abdominal pain 11/23/2022   Mesenteric mass 11/23/2022   Coffee ground emesis 11/23/2022   Nausea and vomiting 10/15/2022   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   Fatigue 09/17/2022   Left hemiparesis (HCC) 09/17/2022   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HCC) 08/22/2022   GERD (gastroesophageal reflux disease) 08/21/2022   Agitation 08/20/2022   Acute left-sided weakness 08/20/2022   Expressive aphasia    Stroke (HCC) 08/19/2022   Leukocytosis 08/19/2022   History of urticaria 04/28/2022   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   Primary osteoarthritis of left knee 02/24/2022   Primary osteoarthritis of right knee  02/24/2022   Lumbar spondylolysis 04/12/2020   History of CVA (cerebrovascular accident) 03/26/2020   Low back pain radiating to right lower extremity 03/21/2020   B12 deficiency 03/06/2020   Positive anti-CCP test 12/21/2019   Arthralgia 12/13/2019   Dermatitis 12/13/2019   Rheumatoid factor positive 12/13/2019   Essential hypertension 12/11/2018   Palpitations 12/11/2018   Acquired hypothyroidism 11/10/2018   Arthritis of knee 09/17/2016   Anxiety 11/22/2014   Asthma without status asthmaticus 11/22/2014   Benign neoplasm of colon, unspecified 11/22/2014   Environmental allergies 11/22/2014   Hypertriglyceridemia 11/22/2014   Hypokalemia 11/22/2014   Personal history of disease of skin and subcutaneous tissue 11/22/2014   REFERRING DIAG: CVA   THERAPY DIAG:  Muscle weakness (generalized)   Other lack of coordination   Rationale for Evaluation and Treatment Rehabilitation   SUBJECTIVE:   Pt. Reports she is doing well today. Pt. Reports that she has been experiencing difficulties keeping up with what day it is.    SUBJECTIVE STATEMENT:   Pt accompanied by: significant other   PERTINENT HISTORY: Patient is an 82 year-old female who was admitted to [facility] on 11/23/2022 with a cerebellar CVA. 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 Pt. Was admitted to Inpatient Rehab from 12/21-1/02/2023. 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  Patient underwent inpatient rehabilitation for 2 weeks.  Patient was assessed and was scheduled for a knee replacement on 08/29/2022 however had to cancel it due to having had a CVA.  Patient had a recent fall 2 days after discharging from inpatient rehab. Past Medical History includes: Knee replacement, essential HTN, hypokalemia, leukocytosis, seizures, positive anti-- CCP test, anxiety disorder, mini strokes.  Patient had shingles with left eye nerve pain s/p 1  year ago.    PRECAUTIONS: Fall   WEIGHT BEARING RESTRICTIONS No   PAIN:  Are you having pain? Pt. Reports having no pain today.   FALLS: Has patient fallen in last 6 months? Yes. Number of falls 1   LIVING ENVIRONMENT: Lives with: Lives with Spouse Lives in: House/apartment Stairs: 2 storey home, resides on the first floor.  External: 2 stairs front no rails, and 6 in back with rails Has following equipment at home: Single point cane, Walker - 2 wheeled, Environmental consultant - 4 wheeled, Shower bench, and bed side commode   PLOF: Independent   PATIENT GOALS  To Regain the use of her left arm   OBJECTIVE:    HAND DOMINANCE: Right   ADLs: Overall ADLs: Husband assists pt. as needed Transfers/ambulation related to ADLs:Pt. Uses a 3 wheeled walker with Husband assist.  Eating: Pt. Is independent with the right hand. Pt. has difficulty cutting food. Grooming: Pt. is using her right hand, however has difficulty sustaining her LUE in elevation to assist with haircare. UB Dressing: Pt. Is independent donning a pullover shirt, and button down shirt. Has difficulty with buttoning, LB Dressing:  Independent donning pants, and socks. Difficulty tying shoes. Toileting: Independent Bathing: Pt. Is able to engage her right hand. Tub Shower transfers: Supervision Equipment: See above for equipment     IADLs: Shopping:  Has not had the opportunity for grocery shopping yet Light housekeeping: Husband is assisting with light house keeping Meal Prep:  Dependent Community mobility: Relies of family/friends Medication management: Husband assisting with weekly pillbox set-up, and administering medication. Financial management: TBD Handwriting: 75% legible   MOBILITY STATUS: Hx of falls   POSTURE COMMENTS:  No Significant postural limitations Sitting balance: supported sitting balance WFL   ACTIVITY TOLERANCE: Activity tolerance:  Fatigues in greater than 30 min.    FUNCTIONAL OUTCOME  MEASURES: FOTO: 57   UPPER EXTREMITY ROM      Active ROM Right Eval: WFL Left eval Left  01/22/23 Left  03/03/23 Left  04/14/23 Left  05/28/2023  Shoulder flexion   132 100 108 108 108  Shoulder abduction   80 85 85 85 85  Shoulder adduction          Shoulder extension          Shoulder internal rotation          Shoulder external rotation          Elbow flexion   140 140 WFL WFL WFL  Elbow extension   WNL WNL [facility] WFL WFL  Wrist flexion   65 68     Wrist extension   -10 20 24  32 32  Wrist ulnar deviation     12 10 14 14   Wrist radial deviation     8 14 14 16   Wrist pronation          Wrist supination          (Blank rows = not tested)   Left digit flexion to [facility]: 2nd: 0cm, 3rd: 0cm, 4th: 0cm, 5th: 0cm   Limited Left full 2nd digit extension     UPPER EXTREMITY MMT:      MMT Right Eval: 4+/5 overall Left Eval Left 01/22/23 Left 03/03/23 Left  04/14/2023 Left 05/28/2023  Shoulder flexion   3/5 3-/5 3-/5 3-/5 3-/5  Shoulder abduction   3-/5 3-/5 3-/5 3-/5 3-/5  Shoulder adduction          Shoulder extension          Shoulder internal rotation          Shoulder external rotation          Middle trapezius          Lower trapezius          Elbow flexion   3+/5 4/5 N/A 4/5 4/5  Elbow extension   3+/5 4/4 N/A 4/5 4/5  Wrist flexion          Wrist extension   2-/5 3-/5 3-/5 3/5 3+/5  Wrist ulnar deviation          Wrist radial deviation          Wrist pronation          Wrist supination          (Blank rows = not tested)   HAND FUNCTION: Grip strength:  Right: 26#, Left: 10# Pinch strength: Right 8#, Left: 3#, 3 Pt. Pinch strength: Right: 9#, L: 2#  01/22/2023 Grip strength: Right: 26#, Left: 12# Pinch strength:  Pinch meter used at the initial eval has been sent out for recalibration   03/03/2023 Grip strength: Right: 26#, Left: 13# Pinch strength:  Pinch meter used at the initial eval has been sent out for recalibration  04/14/2023: Grip strength: Right: 26#,  Left: 13# Pinch strength:   Right 8#, Left: 4#, 3 Pt. Pinch strength: Right: 9#, L: 3#    05/28/2023: Grip strength: Right: 26#, Left: 13# Pinch strength:   Right 8#, Left: 2#, 3 Pt. Pinch strength: Right: 9#, L: 4#     COORDINATION: Right: 22 sec., Left: <5 min. To place 7 pegs with increased compensation proximally in the trunk, and through reflexive associated reactions.  01/22/23 Right: 22 sec., Left: 3 min. & 4 sec.  03/03/23 Right: 22 sec., Left: 1 min. & 39 sec.  04/14/23   TBD  05/28/23 Right: 22 sec., Left:  5 min.      SENSATION: Light touch: WFL, proprioceptive awareness: Intact   EDEMA: N/A   MUSCLE TONE: LUE: Hypotonic   COGNITION: Overall cognitive status: WFL for tasks assessed. Pt. Is impulsive at times.   VISION: Subjective report: Pt. report having shingles affecting left eye  s/p 1 year. Has nerve pain Baseline vision: Wears glasses for reading only Visual history:  updated see clinical impression   VISION ASSESSMENT:    WFL for tasks performed   PERCEPTION: Intact   PRAXIS: Impaired: Motor planning   OBSERVATIONS:  Pt. more alert, and engaging since prior to the most recent hospitalization.     TODAY'S TREATMENT:    Therapeutic Ex.   Pt. worked on [employer], and reciprocal motion using the UBE while seated for 8 min. with no resistance. Constant monitoring was provided.   Neuromuscular re-education:   Pt. performed left hand FMC tasks using tip pinch to pick up 1/2" small circular tipped pegs, and placing them into a small flat pegboard. Pt. Worked on removing pegs using tip pinch and releasing them back into the container followed by finger extension of 2nd digit.  Pt. Required proprioceptive input through the hand on the tabletop during the task.   Manual Therapy:  Pt. tolerated soft tissue massage to the scapular, and shoulder musculature. Pt. performed scapular mobilizations for scapular elevation, depression,  abduction/adduction in sitting . Manual therapy was performed independent of, and in preparation for ROM, there. Ex, and functional hand use.       PATIENT EDUCATION: Education details: Use of calendar for better awareness of what day it is. Person educated: Patient and Spouse Education method: Medical illustrator Education comprehension: verbalized understanding, returned demonstration, and needs further education     HOME EXERCISE PROGRAM:    Reviewed activities at home to promote isolated 2nd digit extension.    GOALS: Goals reviewed with patient? Yes   SHORT TERM GOALS: Target date:  07/09/2023         1. Patient will be independent with home exercise program for the left upper extremity Baseline: 05/28/2023: Pt. Requires assist 04/09/2023: Pt. Continues to consistently attempt to engage her hand at hand 02/01/2023: Pt. Consistently attempts to perform HEPs independently. No current home exercise program Goal status: Ongoing     LONG TERM GOALS: Target date:  08/20/2023     1.  Patient will improve left shoulder strength by 2 mm grades to be  able to sustain UE's in elevation long enough to wash her hair.  Baseline: 05/28/2023: Left shoulder flexion: 3-/5, abduction: 3-/5. Pt. Has an old left shoulder injury limiting progression with left shoulder strength 05/26/2023: Pt. continues to present with limitiations in sustaining LUE elevation long enough to perform hair care thoroughly 04/09/2023: Pt. is limited with sustaining LUE elevation long enough to perform hair care thoroughly. 03/03/2023: Left shoulder flexion: 3-/5, abduction: 3-/5 01/22/2023: Left shoulder flexion: 3-/5, abduction: 3-/5 Eval: Left shoulder flexion: 3/5, abduction: 3-/5 Goal status: Not met, Deferred   2.  Patient will improve left shoulder active abduction to be able to comb her hair Baseline: 05/28/2023: left shoulder flexion: 108 abduction: 85 Pt. Has an old left shoulder injury limiting progression with  left shoulder ROM 05/26/2023:  5/10 left shoulder pain with ROM limits using it functionally during hair care. 04/09/2023: Pt. Presents with difficulty abducting her left shoulder enough to thoroughly complete haircare. 03/03/2023: Shoulder abduction: 85 01/22/2023: Shoulder abduction: 85 Eval: Left shoulder abduction is 80(108) Goal status: Not met, Deferred   3.  Patient will independently button her shirt with modified independence. Baseline: 05/28/2023: pt. Continues to work towards progressing with buttoning 05/26/2023:  Pt. Continues to progress towards buttoning. 04/09/2023: Pt. Continues to progress towards buttoning. 03/03/2023: Pt. Continues to have difficulty with buttoning. 01/22/2023: Pt. continues to have difficulty. Eval: Patient has difficulty.  Goal status: Ongoing   4.  Patient well improve left grip strength in preparation for securely holding flowers. Baseline: 05/28/2023: Right: 26#, Left: 13#  pt. Presents with difficulty securely holding flowers in her left hand. 05/26/2023: TBD 04/09/2023: Pt. Is able to hold, and hike pants with the left hand, continues to have difficulty with securely holding flowers.   03/03/2023: left grip strength: 13# 01/22/2023: Left: 12# Eval: Pt. Is unable to securely hold flowers. Goal status: Ongoing   5.  Pt. will independently recall adaptive  strategies for performing ADL tasks including: flossing teeth, donning bra, applying makeup. Baseline: 05/28/2023: Pt. Needs continued education about adaptive strategies.05/26/2023: Continue 04/09/2023: Continue3/26/2024: Continue 01/22/2023: Pt. continues to benefit from education about adaptive strategies during ADLs, and IADLs. Eval: Pt. to be provided with adaptive strategies. Goal status: Ongoing   6.  Pt. will improve FOTO score by 2 points to reflect patient perceived performance improvement assessment specific ADLs  and IADLs Baseline: 2/53/6644: FOTO score 59 05/26/2023: TBD 5/02/204:  TBD 03/03/23: FOTO 62 Eval:  57 Goal status: Ongoing  7.  Pt. will improve left hand coordination skills in order to be able to handle, and sort utensils in a drawer.     Baseline: 05/28/2023: Left:  5 min. 05/26/2023 Pt. Continues to present with difficulty handling and sorting utensils. 04/14/2023: 56 04/09/2023: TBD 03/03/23: 1 min. & 39 01/22/2022: Left: 3 min. & 4 sec. Eval: Pt. has difficulty sorting, and placing utensils with the left hand. Left FMC : >5 min. For 7 pegs on the 9 hole peg test.    Goal Status:  Ongoing  8. Pt. will improve active left 2nd digit extension to be able able to isolate her 2nd digit in preparation for pressing/pushing buttons on appliances, phones, or remotes. Baseline: 05/28/2023: Pt. Continues to improve with left 2nd digit extension 05/26/2023: Improving with left 2nd digit extension. 04/09/2023: Pt. Is progressing with isolating left 2nd digit extension, however continues to present with limited increased flexor tone. 03/03/23: Pt. Continues to work on improving consistency with 2nd digit extension to press the remote. 01/22/2023: is able  to perform full digit extension, although 2nd digit is slow to extend s 2/2 flexor tone. Pt.  Eval: Pt. is able to is unable to actively perform full digit extension Goal status:  Ongoing   ASSESSMENT:   CLINICAL IMPRESSION:  Pt. reports having trouble putting in her earrings and desires to work on that in a future session. Pt. Tolerated manual therapy and therapeutic exercise well. Pt. continues to improve left hand [facility] skills. Pt. Was able to use tip pinch to pick up pegs and place them onto flat peg board however required increased time and verbal cues due to difficulties manipulating peg into correct position in hand to place it onto the peg board. Pt. Required proprioceptive input through the hand on the tabletop throughout completing the task.  Pt. continues to benefit from skilled OT services to work on improving L shoulder, elbow, and wrist strength, improving  motor control and coordination in order to prepare the left upper extremity and hand for functional reaching and engaging the upper left extremity when performing hair care, buttoning, holding flowers, cutting food, and manipulating utensils to set the table as well as adaptive strategies during daily IADL care.     PERFORMANCE DEFICITS in functional skills including ADLs, IADLs, coordination, proprioception, ROM, strength, FMC, and GMC, cognitive skills including memory, and psychosocial skills including coping strategies, environmental adaptation, interpersonal interactions, and routines and behaviors.    IMPAIRMENTS are limiting patient from ADLs, IADLs, education, leisure, and social participation.    COMORBIDITIES may have co-morbidities  that affects occupational performance. Patient will benefit from skilled OT to address above impairments and improve overall function.   MODIFICATION OR ASSISTANCE TO COMPLETE EVALUATION: Min-Moderate modification of tasks or assist with assess necessary to complete an evaluation.   OT OCCUPATIONAL PROFILE AND HISTORY: Detailed assessment: Review of records and additional review of physical, cognitive, psychosocial history related to current functional performance.   CLINICAL DECISION MAKING: Moderate - several treatment options, min-mod task modification necessary   REHAB POTENTIAL: Good   EVALUATION COMPLEXITY: Moderate      PLAN: OT FREQUENCY: 2x/week   OT DURATION: 12 weeks   PLANNED INTERVENTIONS: self care/ADL training, therapeutic exercise, therapeutic activity, neuromuscular re-education, manual therapy, passive range of motion, functional mobility training, electrical stimulation, and paraffin   RECOMMENDED OTHER SERVICES: PT   CONSULTED AND AGREED WITH PLAN OF CARE: Patient and family member/caregiver   PLAN FOR NEXT SESSION: Initiate OT treatment  Herma Carson, OTS  07/02/2023 4:34 PM  This entire session was performed under the  direct supervision and direction of a licensed therapist. I have personally read, edited, and approve of the note as written.   Olegario Messier, MS, OTR/L  7/25/22024

## 2023-07-07 ENCOUNTER — Ambulatory Visit: Payer: Medicare HMO

## 2023-07-07 ENCOUNTER — Ambulatory Visit: Payer: Medicare HMO | Admitting: Occupational Therapy

## 2023-07-07 DIAGNOSIS — R531 Weakness: Secondary | ICD-10-CM | POA: Diagnosis not present

## 2023-07-07 DIAGNOSIS — R262 Difficulty in walking, not elsewhere classified: Secondary | ICD-10-CM

## 2023-07-07 DIAGNOSIS — M6281 Muscle weakness (generalized): Secondary | ICD-10-CM | POA: Diagnosis not present

## 2023-07-07 DIAGNOSIS — R2681 Unsteadiness on feet: Secondary | ICD-10-CM

## 2023-07-07 DIAGNOSIS — R278 Other lack of coordination: Secondary | ICD-10-CM | POA: Diagnosis not present

## 2023-07-07 NOTE — Therapy (Signed)
OUTPATIENT PHYSICAL THERAPY NEURO TREATMENT/RECERT    Patient Name: [name] MRN: [id number] DOB:[birth year], 82 y.o., female Today's Date: 07/07/2023   PCP: Aram Beecham, D MD REFERRING PROVIDER: Jacquelynn Cree PA   END OF SESSION:  PT End of Session - 07/07/23 1619     Visit Number 13    Number of Visits 37    Date for PT Re-Evaluation 09/29/23    Authorization Type 3/10 eval 04/14/23    Progress Note Due on Visit 20    PT Start Time 1619    PT Stop Time 1658    PT Time Calculation (min) 39 min    Equipment Utilized During Treatment Gait belt    Activity Tolerance Patient tolerated treatment well    Behavior During Therapy WFL for tasks assessed/performed                  Past Medical History:  Diagnosis Date   Anemia    Arthritis    Asthma    uses inhaler just prior to surgery to avoid attack   Back pain    from previous injury   Complication of anesthesia    has woken  up during 2 different surgery   Depression    no current issue/treatment; situation   Gallstones    GERD (gastroesophageal reflux disease)    Hiatal hernia    patient does NOT have nerve/muscle disease   History of kidney stones    HLD (hyperlipidemia)    HTN (hypertension)    Hypothyroidism    Kidney stones    Knee pain    Nausea and vomiting 10/15/2022   Non-diabetic pancreatic hormone dysfunction years   pt. 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   Pancreatitis    Pneumonia    Seizures (HCC)    caused by dye injected during a procedure   Shortness of breath    with exertion   Sinus problem    frequent infections/congestion   Stroke (HCC) 2021   reports having CVA in 2021 and having mini strokes before that   Thyroid disease    Past Surgical History:  Procedure Laterality Date   ABDOMINAL HYSTERECTOMY     APPENDECTOMY     CARPAL TUNNEL RELEASE  10+ years ago   bilateral   EYE SURGERY  3 yrs ago   bilateral cataracts   FOOT OSTEOTOMY  6 weeks ago   Left  foot: great, 2nd & 3rd   FOOT OSTEOTOMY  5 years ago   Right great toe   HAND SURGERY Bilateral 2011-most recent   multiple hand surgeries, 2 on left, 3 on right   KNEE ARTHROPLASTY Right 04/28/2022   Procedure: COMPUTER ASSISTED TOTAL KNEE ARTHROPLASTY;  Surgeon: Donato Heinz, MD;  Location: [facility];  Service: Orthopedics;  Laterality: Right;   LOOP RECORDER INSERTION N/A 05/16/2020   Procedure: LOOP RECORDER INSERTION;  Surgeon: Marcina Millard, MD;  Location: [facility];  Service: Cardiovascular;  Laterality: N/A;   NASAL SINUS SURGERY  most recent 7-8 yrs ago   7 sinus surgeries    TRIGGER FINGER RELEASE  11/19/2011   Procedure: RELEASE TRIGGER FINGER/A-1 PULLEY;  Surgeon: Nicki Reaper, MD;  Location: [facility];  Service: Orthopedics;  Laterality: Right;  release a-1 pulley right index finger and cyst removal   WRIST GANGLION EXCISION  1980's   right   Patient Active Problem List   Diagnosis Date Noted   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 11/27/2022   Hypomagnesemia 11/27/2022   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   HLD (hyperlipidemia) 11/23/2022   Asthma 11/23/2022   Depression with anxiety 11/23/2022   Chronic diastolic CHF (congestive heart failure) (HCC) 11/23/2022   Normocytic anemia 11/23/2022   Aspiration pneumonia (HCC) 11/23/2022   AKI (acute kidney injury) (HCC) 11/23/2022   Abdominal pain 11/23/2022   Mesenteric mass 11/23/2022   Coffee ground emesis 11/23/2022   Nausea and vomiting 10/15/2022   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   Fatigue 09/17/2022   Left hemiparesis (HCC) 09/17/2022   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HCC) 08/22/2022   GERD (gastroesophageal reflux disease) 08/21/2022   Agitation 08/20/2022   Acute left-sided weakness 08/20/2022   Expressive aphasia    Stroke (HCC) 08/19/2022   Leukocytosis 08/19/2022   History of urticaria 04/28/2022   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    Primary osteoarthritis of left knee 02/24/2022   Primary osteoarthritis of right knee 02/24/2022   Lumbar spondylolysis 04/12/2020   History of CVA (cerebrovascular accident) 03/26/2020   Low back pain radiating to right lower extremity 03/21/2020   B12 deficiency 03/06/2020   Positive anti-CCP test 12/21/2019   Arthralgia 12/13/2019   Dermatitis 12/13/2019   Rheumatoid factor positive 12/13/2019   Essential hypertension 12/11/2018   Palpitations 12/11/2018   Acquired hypothyroidism 11/10/2018   Arthritis of knee 09/17/2016   Anxiety 11/22/2014   Asthma without status asthmaticus 11/22/2014   Benign neoplasm of colon, unspecified 11/22/2014   Environmental allergies 11/22/2014   Hypertriglyceridemia 11/22/2014   Hypokalemia 11/22/2014   Personal history of disease of skin and subcutaneous tissue 11/22/2014    ONSET DATE: 11/23/22  REFERRING DIAG: TIA  THERAPY DIAG:  Muscle weakness (generalized)  Difficulty in walking, not elsewhere classified  Unsteadiness on feet  Rationale for Evaluation and Treatment: Rehabilitation  SUBJECTIVE:                                                                                                                                                                                             SUBJECTIVE STATEMENT: Pt spouse reports pt was resting a lot, just woke up an hour ago. Pt reports no falls, she reports no pain. Pt wearing L knee brace. Pt has not had much to eat or drink today.  Pt accompanied by: significant other  PERTINENT HISTORY: Patient is an 82 year-old female who was admitted to [facility] on 11/23/2022 with a cerebellar CVA. 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 Pt. Was admitted to Inpatient Rehab from 12/21-1/02/2023. 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  Patient underwent  inpatient rehabilitation for 2 weeks.  Patient was assessed and was  scheduled for a knee replacement on 08/29/2022 however had to cancel it due to having had a CVA.   PMH includes: anemia, arthritis, back pain, depression, hernia, GERD, HLD, HTN, hypothyroidism, knee pain, pancreatic hormone dysfunction, pneumonia, seizures, stroke, thyroid disease.  Patient has a donjoy brace for L knee. Walking with hand hold help with husband.   PAIN:  Are you having pain? Yes: NPRS scale: 0/10 Pain location: L knee Pain description: aching, gives out Aggravating factors: climbing into pain, moving it, walking around house Relieving factors: rest   PRECAUTIONS: Fall, has latex allergy  WEIGHT BEARING RESTRICTIONS: No  FALLS: Has patient fallen in last 6 months? Yes. Number of falls 4 falls   LIVING ENVIRONMENT: Lives with: lives with their family and lives with their spouse Lives in: House/apartment Stairs: Yes: Internal: flight steps;   and External: 2 steps; on right going up and on left going up Has following equipment at home: Single point cane, Walker - 2 wheeled, Shower bench, and bed side commode  PLOF: Independent  PATIENT GOALS: to have less pain and move better  OBJECTIVE:   DIAGNOSTIC FINDINGS:  Disc levels:   C2-3: No significant stenosis.   C3-4: A rightward disc osteophyte complex effaces the ventral CSF. Severe left and moderate right foraminal stenosis is present.   C4-5: A broad-based disc osteophyte complex effaces the ventral CSF. The canal is narrowed 7 mm. Severe left and moderate right foraminal stenosis is present.   C5-6: Chronic loss of disc height is present. Broad-based disc osteophyte complex is asymmetric to the right. Partial effacement of the ventral CSF is present. Moderate foraminal stenosis is worse right than left.   C6-7: A broad-based disc osteophyte complex is present. This partially effaces the ventral CSF. Moderate foraminal narrowing is worse right than left.   C7-T1: Uncovertebral spurring is present  bilaterally. Mild bilateral foraminal stenosis is present.   IMPRESSION: 1. Multilevel spondylosis of the cervical spine as described. 2. 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 3. 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 4. No other significant cord signal abnormality to suggest ischemic changes to the cord. 5. Severe left and moderate right foraminal stenosis at C3-4 and C4-5. 6. Moderate foraminal stenosis bilaterally at C5-6 and C6-7 is worse on the right. 7. Mild bilateral foraminal narrowing at C7-T1. 8. Prominent soft tissue pannus at C1-2 effaces the ventral CSF. 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  IMPRESSION: 1. 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 2. 9 mm right occipital pole infarct. 3. Punctate white matter infarct in the left occipital lobe. 4. Additional punctate cortical infarct in the medial right occipital lobe more superiorly. 5. Expected evolution of previous right posterior frontal lobe white matter infarct. 6. Stable atrophy and white matter disease likely reflects the sequela of chronic microvascular ischemia.  COGNITION: Overall cognitive status:  two strokes   SENSATION: WFL  COORDINATION: Heel slide test limited by LLE pain  POSTURE: rounded shoulders, forward head, and flexed trunk   LOWER EXTREMITY ROM:     Bowing of L knee  LOWER EXTREMITY MMT:    MMT Right Eval Left Eval R 7/16 L 7/16  Hip flexion 3 3* 3+ 3+  Hip extension      Hip abduction 3 3* 4- 4-  Hip adduction  3 * 4- 4-  Hip internal rotation      Hip external rotation      Knee flexion 3 3* 4- 4-  Knee extension 3 3* 4- 4-  Ankle dorsiflexion 4 4 4 4   Ankle plantarflexion 4 4 4 4   (Blank rows = not tested) * pain  BED MOBILITY:  Per patient she has difficulty, occasionally  needs help  TRANSFERS: Assistive device utilized: None  Sit to stand: CGA and Min A Stand to sit: Min A Chair to chair: CGA    GAIT: Gait pattern: step to pattern, decreased stance time- Left, genu recurvatum- Left, and shuffling Distance walked: 10 ft Assistive device utilized: None Level of assistance: CGA Comments: Patient is very painful with ambulation, unable to ambulate longer duration   FUNCTIONAL TESTS:  5 times sit to stand: unable to tolerate 10 meter walk test: unable to tolerate  PATIENT SURVEYS:  FOTO 47  TODAY'S TREATMENT:                                                                                                                              DATE: 07/07/23   TA: Partial goal assessment completed on this date for recertification (goals recently retest on 7/16). For details, please refer to assessment and goal sections below.  PT reviews importance of HEP to make gains, updates HEP (see below) to include supine exercises performed in previous sessions. Plan to review future visit.  TE: Seated: LAQ 2x15 each LE March 2x20 each LE Seated heel raise 20x bilat LE STS 1x2. 1x3, 1x5  hands pushing of BLE. Reports gets easier as she goes along  Gait with RW and WC follow x 52 ft. Pt requires rest break due to fatigue following intervention BP following interventions: 112/62 mmHg HR 75 bpm Long seated rest break Seated DF 2x15 bilat Pt fatigue level decreases with seated rest    PATIENT EDUCATION: Education details: Pt educated throughout session about proper posture and technique with exercises. Improved exercise technique, movement at target joints, use of target muscles after min to mod verbal, visual, tactile cues. Importance of HEP. Continued education on  importance of nutrition and hydration prior to PT treatment to prevent orthostatic hypotension from low fluid volumes.   Person educated: Patient and Spouse Education method: Explanation,  Demonstration, Tactile cues, and Verbal cues Education comprehension: verbalized understanding, returned demonstration, verbal cues required, and tactile cues required  HOME EXERCISE PROGRAM:   07/07/23: Access Code: [email or web address] URL: [email or web address] Date: 07/07/2023 Prepared by: Temple Pacini  Exercises - Supine Bridge  - 1 x daily - 3-6 x weekly - 2 sets - 5-10 reps - Supine Heel Slide  - 1 x daily - 3-6 x weekly - 2 sets - 5-10 reps - Supine March  - 1 x daily - 3-6 x weekly - 2 sets - 5-10 reps  Access Code: [email or web address] URL: [email or web address] Date: 06/23/2023 Prepared by: Grier Rocher  Exercises - Standing  March with Counter Support  - 1 x daily - 7 x weekly - 2 sets - 10 reps - Standing Hip Abduction with Unilateral Counter Support  - 1 x daily - 7 x weekly - 2 sets - 8 reps  Access Code: [email or web address] URL: [email or web address] Date: 04/14/2023 Prepared by: Precious Bard  Exercises - Seated Single Leg Hip Abduction  - 1 x daily - 7 x weekly - 2 sets - 10 reps - 5 hold - Seated Heel Slide  - 1 x daily - 7 x weekly - 2 sets - 10 reps - 5 hold - Seated Heel Toe Raises  - 1 x daily - 7 x weekly - 2 sets - 10 reps - 5 hold  GOALS: Goals reviewed with patient? Yes  SHORT TERM GOALS: Target date: 08/18/2023      Patient will be independent in home exercise program to improve strength/mobility for better functional independence with ADLs. Baseline: 5/7; 07/07/23: not performing HEP, provided education on importance of HEP to make gains Goal status: IN PROGRESS    LONG TERM GOALS: Target date: 09/29/2023     Patient will increase FOTO score to equal to or greater than   57%  to demonstrate statistically significant improvement in mobility and quality of life.  Baseline:  47%  7/16: 60%  Goal status: MET at assessment 7/16  2.  Patient (> 69 years old) will complete five times sit to stand test in < 15 seconds indicating an  increased LE strength and improved balance. Baseline: 5/7: unable to tolerate test  7/16: 17.4sec  Goal status: IN PROGRESS  3.   Patient will deny any falls over past 4 weeks to demonstrate improved safety awareness at home and work.  Baseline: 5/7: multiple falls   1 fall from bed last week. ; 07/07/23: no falls  Goal status: MET  4.  Patient will increase BLE gross strength to 4+/5 as to improve functional strength for independent gait, increased standing tolerance and increased ADL ability Baseline: 5/7: see above  7/16: improved to grossly 4-/5 overall  Goal status: INITIAL  5.  Patient will be able to perform household work/ chores without increase in symptoms. Baseline: 5/7: unable to perform   7/16: Pt reports that she has returned to doing some housework again, but reports that she is performing from [facility]. ; 07/07/23: pt completing basic ADLs, but otherwise not doing household chores  Goal status: IN PROGRESS  6. Pt will increase gait speed to >0.8 m/s to indicate reduced fall risk and improved safety with house hold level ambulation.  Baseline: 7/16: 0.463m/s   Goal status: New      ASSESSMENT:  CLINICAL IMPRESSION:  Partial goal reassessment completed for recert, goal testing was recently completed on 7/16 (please refer to this PT note for details).  Pt reports no recent falls in last 4 weeks, meeting this goal. Pt has not met HEP goal, however, and did report to PT she is not completing her exercises at home and is struggling to do so because of fatigue. PT updated HEP to include supine exercises, which have all been performed in previous sessions. Plan to review these next 1-2 visits. Patient's condition has the potential to improve in response to therapy. Maximum improvement is yet to be obtained. The anticipated improvement is attainable and reasonable in a generally predictable time.  The patient will benefit from skilled physical therapy to reduce pain, improve mobility,  improve strength and improve quality of life.  OBJECTIVE IMPAIRMENTS: Abnormal gait, cardiopulmonary status limiting activity, decreased activity tolerance, decreased balance, decreased cognition, decreased endurance, decreased knowledge of use of DME, decreased mobility, difficulty walking, decreased ROM, decreased strength, decreased safety awareness, hypomobility, increased fascial restrictions, impaired perceived functional ability, impaired flexibility, impaired UE functional use, improper body mechanics, postural dysfunction, and pain.   ACTIVITY LIMITATIONS: carrying, lifting, bending, sitting, standing, squatting, stairs, transfers, bed mobility, bathing, toileting, dressing, reach over head, hygiene/grooming, locomotion level, and caring for others  PARTICIPATION LIMITATIONS: meal prep, cleaning, laundry, medication management, personal finances, interpersonal relationship, driving, shopping, community activity, yard work, school, and church  PERSONAL FACTORS: Age, Behavior pattern, Education, Fitness, Past/current experiences, Time since onset of injury/illness/exacerbation, and 3+ comorbidities: anemia, arthritis, back pain, depression, hernia, GERD, HLD, HTN, hypothyroidism, knee pain, pancreatic hormone dysfunction, pneumonia, seizures, stroke, thyroid disease  are also affecting patient's functional outcome.   REHAB POTENTIAL: Fair    CLINICAL DECISION MAKING: Evolving/moderate complexity  EVALUATION COMPLEXITY: Moderate  PLAN:  PT FREQUENCY: 1-2x/week  PT DURATION: 12 weeks  PLANNED INTERVENTIONS: Therapeutic exercises, Therapeutic activity, Neuromuscular re-education, Balance training, Gait training, Patient/Family education, Self Care, Joint mobilization, Joint manipulation, Stair training, Vestibular training, Canalith repositioning, Visual/preceptual remediation/compensation, Orthotic/Fit training, DME instructions, Electrical stimulation, Wheelchair mobility training,  Spinal mobilization, Cryotherapy, Moist heat, Compression bandaging, scar mobilization, Splintting, Taping, Traction, Ultrasound, Manual therapy, and Re-evaluation  PLAN FOR NEXT SESSION:  Standing balance and gait as tolerated   seated and supine strengthening of LLE,  Continue plan  Baird Kay, PT 07/07/2023, 5:11 PM

## 2023-07-08 DIAGNOSIS — N1831 Chronic kidney disease, stage 3a: Secondary | ICD-10-CM | POA: Diagnosis not present

## 2023-07-08 DIAGNOSIS — I10 Essential (primary) hypertension: Secondary | ICD-10-CM | POA: Diagnosis not present

## 2023-07-08 DIAGNOSIS — I69953 Hemiplegia and hemiparesis following unspecified cerebrovascular disease affecting right non-dominant side: Secondary | ICD-10-CM | POA: Diagnosis not present

## 2023-07-08 DIAGNOSIS — E785 Hyperlipidemia, unspecified: Secondary | ICD-10-CM | POA: Diagnosis not present

## 2023-07-08 DIAGNOSIS — Z79899 Other long term (current) drug therapy: Secondary | ICD-10-CM | POA: Diagnosis not present

## 2023-07-08 DIAGNOSIS — R8290 Unspecified abnormal findings in urine: Secondary | ICD-10-CM | POA: Diagnosis not present

## 2023-07-08 DIAGNOSIS — R7303 Prediabetes: Secondary | ICD-10-CM | POA: Diagnosis not present

## 2023-07-08 DIAGNOSIS — Z0000 Encounter for general adult medical examination without abnormal findings: Secondary | ICD-10-CM | POA: Diagnosis not present

## 2023-07-08 DIAGNOSIS — E079 Disorder of thyroid, unspecified: Secondary | ICD-10-CM | POA: Diagnosis not present

## 2023-07-09 ENCOUNTER — Ambulatory Visit: Payer: Medicare HMO | Admitting: Physical Therapy

## 2023-07-09 ENCOUNTER — Ambulatory Visit: Payer: Medicare HMO | Admitting: Occupational Therapy

## 2023-07-09 DIAGNOSIS — R8290 Unspecified abnormal findings in urine: Secondary | ICD-10-CM | POA: Diagnosis not present

## 2023-07-14 ENCOUNTER — Ambulatory Visit: Payer: Medicare HMO | Admitting: Physical Therapy

## 2023-07-14 ENCOUNTER — Ambulatory Visit: Payer: Medicare HMO | Attending: Internal Medicine

## 2023-07-14 DIAGNOSIS — I6381 Other cerebral infarction due to occlusion or stenosis of small artery: Secondary | ICD-10-CM | POA: Insufficient documentation

## 2023-07-14 DIAGNOSIS — M6281 Muscle weakness (generalized): Secondary | ICD-10-CM

## 2023-07-14 DIAGNOSIS — R278 Other lack of coordination: Secondary | ICD-10-CM | POA: Diagnosis not present

## 2023-07-14 DIAGNOSIS — R262 Difficulty in walking, not elsewhere classified: Secondary | ICD-10-CM | POA: Diagnosis not present

## 2023-07-14 DIAGNOSIS — R2681 Unsteadiness on feet: Secondary | ICD-10-CM | POA: Insufficient documentation

## 2023-07-14 DIAGNOSIS — R531 Weakness: Secondary | ICD-10-CM | POA: Insufficient documentation

## 2023-07-14 NOTE — Therapy (Unsigned)
OUTPATIENT PHYSICAL THERAPY NEURO TREATMENT/RECERT    Patient Name: KARELIS DUKETT MRN: [id number] DOB:[birth year], 82 y.o., female Today's Date: 07/14/2023   PCP: Aram Beecham, D MD REFERRING PROVIDER: Jacquelynn Cree PA   END OF SESSION:  PT End of Session - 07/14/23 1530     Visit Number 14    Number of Visits 37    Date for PT Re-Evaluation 09/29/23    Authorization Type 3/10 eval 04/14/23    Progress Note Due on Visit 20    PT Start Time 1535    PT Stop Time 1615    PT Time Calculation (min) 40 min    Equipment Utilized During Treatment Gait belt    Activity Tolerance Patient tolerated treatment well    Behavior During Therapy WFL for tasks assessed/performed                  Past Medical History:  Diagnosis Date   Anemia    Arthritis    Asthma    uses inhaler just prior to surgery to avoid attack   Back pain    from previous injury   Complication of anesthesia    has woken  up during 2 different surgery   Depression    no current issue/treatment; situation   Gallstones    GERD (gastroesophageal reflux disease)    Hiatal hernia    patient does NOT have nerve/muscle disease   History of kidney stones    HLD (hyperlipidemia)    HTN (hypertension)    Hypothyroidism    Kidney stones    Knee pain    Nausea and vomiting 10/15/2022   Non-diabetic pancreatic hormone dysfunction years   pt. 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   Pancreatitis    Pneumonia    Seizures (HCC)    caused by dye injected during a procedure   Shortness of breath    with exertion   Sinus problem    frequent infections/congestion   Stroke (HCC) 2021   reports having CVA in 2021 and having mini strokes before that   Thyroid disease    Past Surgical History:  Procedure Laterality Date   ABDOMINAL HYSTERECTOMY     APPENDECTOMY     CARPAL TUNNEL RELEASE  10+ years ago   bilateral   EYE SURGERY  3 yrs ago   bilateral cataracts   FOOT OSTEOTOMY  6 weeks ago   Left  foot: great, 2nd & 3rd   FOOT OSTEOTOMY  5 years ago   Right great toe   HAND SURGERY Bilateral 2011-most recent   multiple hand surgeries, 2 on left, 3 on right   KNEE ARTHROPLASTY Right 04/28/2022   Procedure: COMPUTER ASSISTED TOTAL KNEE ARTHROPLASTY;  Surgeon: Donato Heinz, MD;  Location: [facility];  Service: Orthopedics;  Laterality: Right;   LOOP RECORDER INSERTION N/A 05/16/2020   Procedure: LOOP RECORDER INSERTION;  Surgeon: Marcina Millard, MD;  Location: [facility];  Service: Cardiovascular;  Laterality: N/A;   NASAL SINUS SURGERY  most recent 7-8 yrs ago   7 sinus surgeries    TRIGGER FINGER RELEASE  11/19/2011   Procedure: RELEASE TRIGGER FINGER/A-1 PULLEY;  Surgeon: Nicki Reaper, MD;  Location: [facility];  Service: Orthopedics;  Laterality: Right;  release a-1 pulley right index finger and cyst removal   WRIST GANGLION EXCISION  1980's   right   Patient Active Problem List   Diagnosis Date Noted   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 11/27/2022   Hypomagnesemia 11/27/2022   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   HLD (hyperlipidemia) 11/23/2022   Asthma 11/23/2022   Depression with anxiety 11/23/2022   Chronic diastolic CHF (congestive heart failure) (HCC) 11/23/2022   Normocytic anemia 11/23/2022   Aspiration pneumonia (HCC) 11/23/2022   AKI (acute kidney injury) (HCC) 11/23/2022   Abdominal pain 11/23/2022   Mesenteric mass 11/23/2022   Coffee ground emesis 11/23/2022   Nausea and vomiting 10/15/2022   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   Fatigue 09/17/2022   Left hemiparesis (HCC) 09/17/2022   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HCC) 08/22/2022   GERD (gastroesophageal reflux disease) 08/21/2022   Agitation 08/20/2022   Acute left-sided weakness 08/20/2022   Expressive aphasia    Stroke (HCC) 08/19/2022   Leukocytosis 08/19/2022   History of urticaria 04/28/2022   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    Primary osteoarthritis of left knee 02/24/2022   Primary osteoarthritis of right knee 02/24/2022   Lumbar spondylolysis 04/12/2020   History of CVA (cerebrovascular accident) 03/26/2020   Low back pain radiating to right lower extremity 03/21/2020   B12 deficiency 03/06/2020   Positive anti-CCP test 12/21/2019   Arthralgia 12/13/2019   Dermatitis 12/13/2019   Rheumatoid factor positive 12/13/2019   Essential hypertension 12/11/2018   Palpitations 12/11/2018   Acquired hypothyroidism 11/10/2018   Arthritis of knee 09/17/2016   Anxiety 11/22/2014   Asthma without status asthmaticus 11/22/2014   Benign neoplasm of colon, unspecified 11/22/2014   Environmental allergies 11/22/2014   Hypertriglyceridemia 11/22/2014   Hypokalemia 11/22/2014   Personal history of disease of skin and subcutaneous tissue 11/22/2014    ONSET DATE: 11/23/22  REFERRING DIAG: TIA  THERAPY DIAG:  Muscle weakness (generalized)  Difficulty in walking, not elsewhere classified  Unsteadiness on feet  Other lack of coordination  Acute left-sided weakness  Rationale for Evaluation and Treatment: Rehabilitation  SUBJECTIVE:                                                                                                                                                                                             SUBJECTIVE STATEMENT: Pt spouse reports pt was resting a lot, just woke up an hour ago. Pt reports no falls, she reports no pain. Pt wearing L knee brace. Pt has not had much to eat or drink today.  Pt accompanied by: significant other  PERTINENT HISTORY: Patient is an 82 year-old female who was admitted to [facility] on 11/23/2022 with a cerebellar CVA. 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 Pt. Was admitted to Inpatient Rehab from 12/21-1/02/2023. Pt was previously diagnosed with a right thalamic  CVA on 08/18/2022 with left-sided weakness.  Patient underwent  inpatient rehabilitation for 2 weeks.  Patient was assessed and was scheduled for a knee replacement on 08/29/2022 however had to cancel it due to having had a CVA.   PMH includes: anemia, arthritis, back pain, depression, hernia, GERD, HLD, HTN, hypothyroidism, knee pain, pancreatic hormone dysfunction, pneumonia, seizures, stroke, thyroid disease.  Patient has a donjoy brace for L knee. Walking with hand hold help with husband.   PAIN:  Are you having pain? Yes: NPRS scale: 0/10 Pain location: L knee Pain description: aching, gives out Aggravating factors: climbing into pain, moving it, walking around house Relieving factors: rest   PRECAUTIONS: Fall, has latex allergy  WEIGHT BEARING RESTRICTIONS: No  FALLS: Has patient fallen in last 6 months? Yes. Number of falls 4 falls   LIVING ENVIRONMENT: Lives with: lives with their family and lives with their spouse Lives in: House/apartment Stairs: Yes: Internal: flight steps;   and External: 2 steps; on right going up and on left going up Has following equipment at home: Single point cane, Walker - 2 wheeled, Shower bench, and bed side commode  PLOF: Independent  PATIENT GOALS: to have less pain and move better  OBJECTIVE:   DIAGNOSTIC FINDINGS:  Disc levels:   C2-3: No significant stenosis.   C3-4: A rightward disc osteophyte complex effaces the ventral CSF. Severe left and moderate right foraminal stenosis is present.   C4-5: A broad-based disc osteophyte complex effaces the ventral CSF. The canal is narrowed 7 mm. Severe left and moderate right foraminal stenosis is present.   C5-6: Chronic loss of disc height is present. Broad-based disc osteophyte complex is asymmetric to the right. Partial effacement of the ventral CSF is present. Moderate foraminal stenosis is worse right than left.   C6-7: A broad-based disc osteophyte complex is present. This partially effaces the ventral CSF. Moderate foraminal narrowing is worse  right than left.   C7-T1: Uncovertebral spurring is present bilaterally. Mild bilateral foraminal stenosis is present.   IMPRESSION: 1. Multilevel spondylosis of the cervical spine as described. 2. 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 3. 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 4. No other significant cord signal abnormality to suggest ischemic changes to the cord. 5. Severe left and moderate right foraminal stenosis at C3-4 and C4-5. 6. Moderate foraminal stenosis bilaterally at C5-6 and C6-7 is worse on the right. 7. Mild bilateral foraminal narrowing at C7-T1. 8. Prominent soft tissue pannus at C1-2 effaces the ventral CSF. 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  IMPRESSION: 1. 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 2. 9 mm right occipital pole infarct. 3. Punctate white matter infarct in the left occipital lobe. 4. Additional punctate cortical infarct in the medial right occipital lobe more superiorly. 5. Expected evolution of previous right posterior frontal lobe white matter infarct. 6. Stable atrophy and white matter disease likely reflects the sequela of chronic microvascular ischemia.  COGNITION: Overall cognitive status:  two strokes   SENSATION: WFL  COORDINATION: Heel slide test limited by LLE pain  POSTURE: rounded shoulders, forward head, and flexed trunk   LOWER EXTREMITY ROM:     Bowing of L knee  LOWER EXTREMITY MMT:    MMT Right Eval Left Eval R 7/16 L 7/16  Hip flexion 3 3* 3+ 3+  Hip extension  Hip abduction 3 3* 4- 4-  Hip adduction 3 * 4- 4-  Hip internal rotation      Hip external rotation      Knee flexion 3 3* 4- 4-  Knee extension 3 3* 4- 4-  Ankle dorsiflexion 4 4 4 4   Ankle plantarflexion 4 4 4 4   (Blank rows = not tested) *  pain  BED MOBILITY:  Per patient she has difficulty, occasionally needs help  TRANSFERS: Assistive device utilized: None  Sit to stand: CGA and Min A Stand to sit: Min A Chair to chair: CGA    GAIT: Gait pattern: step to pattern, decreased stance time- Left, genu recurvatum- Left, and shuffling Distance walked: 10 ft Assistive device utilized: None Level of assistance: CGA Comments: Patient is very painful with ambulation, unable to ambulate longer duration   FUNCTIONAL TESTS:  5 times sit to stand: unable to tolerate 10 meter walk test: unable to tolerate  PATIENT SURVEYS:  FOTO 47  TODAY'S TREATMENT:                                                                                                                              DATE: 07/14/23    Pt extremely lethargic on this day  121/67 HR 76 113/69 HR 79  Sit<>stand from WC    Gait with RW x 37ft with CGA with min assist x 1 to prevent posterior LOB.  BP re-assessed: 98/64 HR 76 Sitting 2 min 128/73 HR 75   TA: Partial goal assessment completed on this date for recertification (goals recently retest on 7/16). For details, please refer to assessment and goal sections below.  PT reviews importance of HEP to make gains, updates HEP (see below) to include supine exercises performed in previous sessions. Plan to review future visit.  TE: Seated: LAQ 2x15 each LE March 2x20 each LE Seated heel raise 20x bilat LE STS 1x2. 1x3, 1x5  hands pushing of BLE. Reports gets easier as she goes along  Gait with RW and WC follow x 52 ft. Pt requires rest break due to fatigue following intervention BP following interventions: 112/62 mmHg HR 75 bpm Long seated rest break Seated DF 2x15 bilat Pt fatigue level decreases with seated rest    PATIENT EDUCATION: Education details: Pt educated throughout session about proper posture and technique with exercises. Improved exercise technique, movement at target joints, use of target  muscles after min to mod verbal, visual, tactile cues. Importance of HEP. Continued education on  importance of nutrition and hydration prior to PT treatment to prevent orthostatic hypotension from low fluid volumes.   Person educated: Patient and Spouse Education method: Explanation, Demonstration, Tactile cues, and Verbal cues Education comprehension: verbalized understanding, returned demonstration, verbal cues required, and tactile cues required  HOME EXERCISE PROGRAM:   07/07/23: Access Code: [email or web address] URL: [email or web address] Date: 07/07/2023 Prepared by: Temple Pacini  Exercises - Supine Bridge  - 1 x daily - 3-6 x  weekly - 2 sets - 5-10 reps - Supine Heel Slide  - 1 x daily - 3-6 x weekly - 2 sets - 5-10 reps - Supine March  - 1 x daily - 3-6 x weekly - 2 sets - 5-10 reps  Access Code: [email or web address] URL: [email or web address] Date: 06/23/2023 Prepared by: Grier Rocher  Exercises - Standing March with Counter Support  - 1 x daily - 7 x weekly - 2 sets - 10 reps - Standing Hip Abduction with Unilateral Counter Support  - 1 x daily - 7 x weekly - 2 sets - 8 reps  Access Code: [email or web address] URL: [email or web address] Date: 04/14/2023 Prepared by: Precious Bard  Exercises - Seated Single Leg Hip Abduction  - 1 x daily - 7 x weekly - 2 sets - 10 reps - 5 hold - Seated Heel Slide  - 1 x daily - 7 x weekly - 2 sets - 10 reps - 5 hold - Seated Heel Toe Raises  - 1 x daily - 7 x weekly - 2 sets - 10 reps - 5 hold  GOALS: Goals reviewed with patient? Yes  SHORT TERM GOALS: Target date: 08/18/2023      Patient will be independent in home exercise program to improve strength/mobility for better functional independence with ADLs. Baseline: 5/7; 07/07/23: not performing HEP, provided education on importance of HEP to make gains Goal status: IN PROGRESS    LONG TERM GOALS: Target date: 09/29/2023     Patient will increase FOTO score to  equal to or greater than   57%  to demonstrate statistically significant improvement in mobility and quality of life.  Baseline:  47%  7/16: 60%  Goal status: MET at assessment 7/16  2.  Patient (> 10 years old) will complete five times sit to stand test in < 15 seconds indicating an increased LE strength and improved balance. Baseline: 5/7: unable to tolerate test  7/16: 17.4sec  Goal status: IN PROGRESS  3.   Patient will deny any falls over past 4 weeks to demonstrate improved safety awareness at home and work.  Baseline: 5/7: multiple falls   1 fall from bed last week. ; 07/07/23: no falls  Goal status: MET  4.  Patient will increase BLE gross strength to 4+/5 as to improve functional strength for independent gait, increased standing tolerance and increased ADL ability Baseline: 5/7: see above  7/16: improved to grossly 4-/5 overall  Goal status: INITIAL  5.  Patient will be able to perform household work/ chores without increase in symptoms. Baseline: 5/7: unable to perform   7/16: Pt reports that she has returned to doing some housework again, but reports that she is performing from [facility]. ; 07/07/23: pt completing basic ADLs, but otherwise not doing household chores  Goal status: IN PROGRESS  6. Pt will increase gait speed to >0.8 m/s to indicate reduced fall risk and improved safety with house hold level ambulation.  Baseline: 7/16: 0.413m/s   Goal status: New      ASSESSMENT:  CLINICAL IMPRESSION:  Partial goal reassessment completed for recert, goal testing was recently completed on 7/16 (please refer to this PT note for details).  Pt reports no recent falls in last 4 weeks, meeting this goal. Pt has not met HEP goal, however, and did report to PT she is not completing her exercises at home and is struggling to do so because of fatigue. PT updated HEP to include supine exercises, which have all been performed in previous sessions.  Plan to review these next 1-2 visits.  Patient's condition has the potential to improve in response to therapy. Maximum improvement is yet to be obtained. The anticipated improvement is attainable and reasonable in a generally predictable time.  The patient will benefit from skilled physical therapy to reduce pain, improve mobility, improve strength and improve quality of life.     OBJECTIVE IMPAIRMENTS: Abnormal gait, cardiopulmonary status limiting activity, decreased activity tolerance, decreased balance, decreased cognition, decreased endurance, decreased knowledge of use of DME, decreased mobility, difficulty walking, decreased ROM, decreased strength, decreased safety awareness, hypomobility, increased fascial restrictions, impaired perceived functional ability, impaired flexibility, impaired UE functional use, improper body mechanics, postural dysfunction, and pain.   ACTIVITY LIMITATIONS: carrying, lifting, bending, sitting, standing, squatting, stairs, transfers, bed mobility, bathing, toileting, dressing, reach over head, hygiene/grooming, locomotion level, and caring for others  PARTICIPATION LIMITATIONS: meal prep, cleaning, laundry, medication management, personal finances, interpersonal relationship, driving, shopping, community activity, yard work, school, and church  PERSONAL FACTORS: Age, Behavior pattern, Education, Fitness, Past/current experiences, Time since onset of injury/illness/exacerbation, and 3+ comorbidities: anemia, arthritis, back pain, depression, hernia, GERD, HLD, HTN, hypothyroidism, knee pain, pancreatic hormone dysfunction, pneumonia, seizures, stroke, thyroid disease  are also affecting patient's functional outcome.   REHAB POTENTIAL: Fair    CLINICAL DECISION MAKING: Evolving/moderate complexity  EVALUATION COMPLEXITY: Moderate  PLAN:  PT FREQUENCY: 1-2x/week  PT DURATION: 12 weeks  PLANNED INTERVENTIONS: Therapeutic exercises, Therapeutic activity, Neuromuscular re-education, Balance  training, Gait training, Patient/Family education, Self Care, Joint mobilization, Joint manipulation, Stair training, Vestibular training, Canalith repositioning, Visual/preceptual remediation/compensation, Orthotic/Fit training, DME instructions, Electrical stimulation, Wheelchair mobility training, Spinal mobilization, Cryotherapy, Moist heat, Compression bandaging, scar mobilization, Splintting, Taping, Traction, Ultrasound, Manual therapy, and Re-evaluation  PLAN FOR NEXT SESSION:  Standing balance and gait as tolerated   seated and supine strengthening of LLE,  Continue plan  Golden Pop, PT 07/14/2023, 3:36 PM

## 2023-07-15 NOTE — Therapy (Signed)
Pt struggling to stay alert and awake by end of PT session.  Pt unable to participate in OT this date.  Encouraged pt's spouse focus on pt's fluid intake once they return home as spouse reports pt has been sleeping all day and has had minimal to eat or drink.  Spouse agreed and verbalized understanding.   Danelle Earthly, MS, OTR/L

## 2023-07-16 ENCOUNTER — Ambulatory Visit: Payer: Medicare HMO | Admitting: Occupational Therapy

## 2023-07-16 ENCOUNTER — Ambulatory Visit: Payer: Medicare HMO | Admitting: Physical Therapy

## 2023-07-21 ENCOUNTER — Ambulatory Visit: Payer: Medicare HMO | Admitting: Physical Therapy

## 2023-07-21 ENCOUNTER — Ambulatory Visit: Payer: Medicare HMO | Admitting: Occupational Therapy

## 2023-07-23 ENCOUNTER — Ambulatory Visit: Payer: Medicare HMO | Admitting: Occupational Therapy

## 2023-07-23 DIAGNOSIS — M6281 Muscle weakness (generalized): Secondary | ICD-10-CM | POA: Diagnosis not present

## 2023-07-23 DIAGNOSIS — R531 Weakness: Secondary | ICD-10-CM | POA: Diagnosis not present

## 2023-07-23 DIAGNOSIS — I6381 Other cerebral infarction due to occlusion or stenosis of small artery: Secondary | ICD-10-CM | POA: Diagnosis not present

## 2023-07-23 DIAGNOSIS — R278 Other lack of coordination: Secondary | ICD-10-CM | POA: Diagnosis not present

## 2023-07-23 DIAGNOSIS — R2681 Unsteadiness on feet: Secondary | ICD-10-CM | POA: Diagnosis not present

## 2023-07-23 DIAGNOSIS — R262 Difficulty in walking, not elsewhere classified: Secondary | ICD-10-CM | POA: Diagnosis not present

## 2023-07-23 NOTE — Therapy (Addendum)
Occupational Therapy Progress Note  Dates of reporting period  05/26/2023   to   07/23/2023    Patient Name: [name] MRN: [id number] DOB:1941/06/12, 82 y.o., female Today's Date: 07/23/2023  PCP: Dr. Judithann Sheen REFERRING PROVIDER:  Dr. Judithann Sheen  END OF SESSION:  PLAN:   OT End of Session - 07/23/23 1626     Visit Number 50    Number of Visits 72    Date for OT Re-Evaluation 08/20/23    OT Start Time 1622    OT Stop Time 1715    OT Time Calculation (min) 53 min    Activity Tolerance Patient tolerated treatment well    Behavior During Therapy WFL for tasks assessed/performed                         Past Medical History:  Diagnosis Date   Anemia    Arthritis    Asthma    uses inhaler just prior to surgery to avoid attack   Back pain    from previous injury   Complication of anesthesia    has woken  up during 2 different surgery   Depression    no current issue/treatment; situation   Gallstones    GERD (gastroesophageal reflux disease)    Hiatal hernia    patient does NOT have nerve/muscle disease   History of kidney stones    HLD (hyperlipidemia)    HTN (hypertension)    Hypothyroidism    Kidney stones    Knee pain    Nausea and vomiting 10/15/2022   Non-diabetic pancreatic hormone dysfunction years   pt. 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   Pancreatitis    Pneumonia    Seizures (HCC)    caused by dye injected during a procedure   Shortness of breath    with exertion   Sinus problem    frequent infections/congestion   Stroke (HCC) 2021   reports having CVA in 2021 and having mini strokes before that   Thyroid disease    Past Surgical History:  Procedure Laterality Date   ABDOMINAL HYSTERECTOMY     APPENDECTOMY     CARPAL TUNNEL RELEASE  10+ years ago   bilateral   EYE SURGERY  3 yrs ago   bilateral cataracts   FOOT OSTEOTOMY  6 weeks ago   Left foot: great, 2nd & 3rd   FOOT OSTEOTOMY  5 years ago   Right great toe   HAND SURGERY  Bilateral 2011-most recent   multiple hand surgeries, 2 on left, 3 on right   KNEE ARTHROPLASTY Right 04/28/2022   Procedure: COMPUTER ASSISTED TOTAL KNEE ARTHROPLASTY;  Surgeon: Donato Heinz, MD;  Location: [facility];  Service: Orthopedics;  Laterality: Right;   LOOP RECORDER INSERTION N/A 05/16/2020   Procedure: LOOP RECORDER INSERTION;  Surgeon: Marcina Millard, MD;  Location: [facility];  Service: Cardiovascular;  Laterality: N/A;   NASAL SINUS SURGERY  most recent 7-8 yrs ago   7 sinus surgeries    TRIGGER FINGER RELEASE  11/19/2011   Procedure: RELEASE TRIGGER FINGER/A-1 PULLEY;  Surgeon: Nicki Reaper, MD;  Location: [facility];  Service: Orthopedics;  Laterality: Right;  release a-1 pulley right index finger and cyst removal   WRIST GANGLION EXCISION  1980's   right   Patient Active Problem List   Diagnosis Date Noted   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 11/27/2022   Hypomagnesemia 11/27/2022   Occlusion  of right vertebral artery 11/23/2022   HLD (hyperlipidemia) 11/23/2022   Asthma 11/23/2022   Depression with anxiety 11/23/2022   Chronic diastolic CHF (congestive heart failure) (HCC) 11/23/2022   Normocytic anemia 11/23/2022   Aspiration pneumonia (HCC) 11/23/2022   AKI (acute kidney injury) (HCC) 11/23/2022   Abdominal pain 11/23/2022   Mesenteric mass 11/23/2022   Coffee ground emesis 11/23/2022   Nausea and vomiting 10/15/2022   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   Fatigue 09/17/2022   Left hemiparesis (HCC) 09/17/2022   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HCC) 08/22/2022   GERD (gastroesophageal reflux disease) 08/21/2022   Agitation 08/20/2022   Acute left-sided weakness 08/20/2022   Expressive aphasia    Stroke (HCC) 08/19/2022   Leukocytosis 08/19/2022   History of urticaria 04/28/2022   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   Primary osteoarthritis of left knee 02/24/2022   Primary osteoarthritis of right knee  02/24/2022   Lumbar spondylolysis 04/12/2020   History of CVA (cerebrovascular accident) 03/26/2020   Low back pain radiating to right lower extremity 03/21/2020   B12 deficiency 03/06/2020   Positive anti-CCP test 12/21/2019   Arthralgia 12/13/2019   Dermatitis 12/13/2019   Rheumatoid factor positive 12/13/2019   Essential hypertension 12/11/2018   Palpitations 12/11/2018   Acquired hypothyroidism 11/10/2018   Arthritis of knee 09/17/2016   Anxiety 11/22/2014   Asthma without status asthmaticus 11/22/2014   Benign neoplasm of colon, unspecified 11/22/2014   Environmental allergies 11/22/2014   Hypertriglyceridemia 11/22/2014   Hypokalemia 11/22/2014   Personal history of disease of skin and subcutaneous tissue 11/22/2014   REFERRING DIAG: CVA   THERAPY DIAG:  Muscle weakness (generalized)   Other lack of coordination   Rationale for Evaluation and Treatment Rehabilitation   SUBJECTIVE:   Pt. Reports she is doing well today. Pt. Reports that she has been experiencing difficulties keeping up with what day it is.    SUBJECTIVE STATEMENT:   Pt accompanied by: significant other   PERTINENT HISTORY: Patient is an 82 year-old female who was admitted to [facility]23/2022 with a cerebellar CVA. 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 Pt. Was admitted to Inpatient Rehab from 12/21-1/02/2023. 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  Patient underwent inpatient rehabilitation for 2 weeks.  Patient was assessed and was scheduled for a knee replacement on 08/29/2022 however had to cancel it due to having had a CVA.  Patient had a recent fall 2 days after discharging from inpatient rehab. Past Medical History includes: Knee replacement, essential HTN, hypokalemia, leukocytosis, seizures, positive anti-- CCP test, anxiety disorder, mini strokes.  Patient had shingles with left eye nerve pain s/p 1  year ago.    PRECAUTIONS: Fall   WEIGHT BEARING RESTRICTIONS No   PAIN:  Are you having pain? Pt. Reports having no pain today.   FALLS: Has patient fallen in last 6 months? Yes. Number of falls 1   LIVING ENVIRONMENT: Lives with: Lives with Spouse Lives in: House/apartment Stairs: 2 storey home, resides on the first floor.  External: 2 stairs front no rails, and 6 in back with rails Has following equipment at home: Single point cane, Walker - 2 wheeled, Environmental consultant - 4 wheeled, Shower bench, and bed side commode   PLOF: Independent   PATIENT GOALS  To Regain the use of her left arm   OBJECTIVE:    HAND DOMINANCE: Right   ADLs: Overall ADLs: Husband assists pt. as needed Transfers/ambulation related to ADLs:Pt. Uses  a 3 wheeled walker with Husband assist. Eating: Pt. Is independent with the right hand. Pt. has difficulty cutting food. Grooming: Pt. is using her right hand, however has difficulty sustaining her LUE in elevation to assist with haircare. UB Dressing: Pt. Is independent donning a pullover shirt, and button down shirt. Has difficulty with buttoning, LB Dressing:  Independent donning pants, and socks. Difficulty tying shoes. Toileting: Independent Bathing: Pt. Is able to engage her right hand. Tub Shower transfers: Supervision Equipment: See above for equipment     IADLs: Shopping:  Has not had the opportunity for grocery shopping yet Light housekeeping: Husband is assisting with light house keeping Meal Prep:  Dependent Community mobility: Relies of family/friends Medication management: Husband assisting with weekly pillbox set-up, and administering medication. Financial management: TBD Handwriting: 75% legible   MOBILITY STATUS: Hx of falls   POSTURE COMMENTS:  No Significant postural limitations Sitting balance: supported sitting balance WFL   ACTIVITY TOLERANCE: Activity tolerance:  Fatigues in greater than 30 min.    FUNCTIONAL OUTCOME  MEASURES: FOTO: 57   UPPER EXTREMITY ROM      Active ROM Right Eval: WFL Left eval Left  01/22/23 Left  03/03/23 Left  04/14/23 Left  05/28/2023 Left 07/23/2023  Shoulder flexion   132 100 108 108 108 60(70)  Shoulder abduction   80 85 85 85 85 65(78)  Shoulder adduction           Shoulder extension           Shoulder internal rotation           Shoulder external rotation           Elbow flexion   140 140 [facility] WFL [facility] WFL  Elbow extension   WNL WNL [facility] WFL WFL  Wrist flexion   65 68      Wrist extension   -10 20 24  32 32 -10  Wrist ulnar deviation     12 10 14 14 10   Wrist radial deviation     8 14 14 16 12   Wrist pronation           Wrist supination           (Blank rows = not tested)   Left digit flexion to [facility]: 2nd: 0cm, 3rd: 0cm, 4th: 0cm, 5th: 0cm   Limited Left full 2nd digit extension     UPPER EXTREMITY MMT:      MMT Right Eval: 4+/5 overall Right 07/23/2023 Left Eval Left 01/22/23 Left 03/03/23 Left  04/14/2023 Left 05/28/2023 Left 07/23/2023  Shoulder flexion   3+/5 3/5 3-/5 3-/5 3-/5 3-/5 2/5  Shoulder abduction   3+/5 3-/5 3-/5 3-/5 3-/5 3-/5 2/5  Shoulder adduction            Shoulder extension            Shoulder internal rotation            Shoulder external rotation            Middle trapezius            Lower trapezius            Elbow flexion   4/5 3+/5 4/5 N/A 4/5 4/5 3+/5  Elbow extension   4/5 3+/5 4/4 N/A 4/5 4/5 3+/5  Wrist flexion            Wrist extension   4/5 2-/5 3-/5 3-/5 3/5 3+/5 3+/5  Wrist ulnar deviation  Wrist radial deviation            Wrist pronation            Wrist supination            (Blank rows = not tested)   HAND FUNCTION: Grip strength: Right: 26#, Left: 10# Pinch strength: Right 8#, Left: 3#, 3 Pt. Pinch strength: Right: 9#, L: 2#  01/22/2023 Grip strength: Right: 26#, Left: 12# Pinch strength:  Pinch meter used at the initial eval has been sent out for recalibration   03/03/2023 Grip strength:  Right: 26#, Left: 13# Pinch strength:  Pinch meter used at the initial eval has been sent out for recalibration  04/14/2023: Grip strength: Right: 26#, Left: 13# Pinch strength:   Right 8#, Left: 4#, 3 Pt. Pinch strength: Right: 9#, L: 3#    05/28/2023: Grip strength: Right: 26#, Left: 13# Pinch strength:   Right 8#, Left: 2#, 3 Pt. Pinch strength: Right: 9#, L: 4#  07/23/2023  Grip strength: Right: 5#, Left: 2# Pinch strength:   Right 6#, Left: 2#, 3 Pt. Pinch strength: Right: 5#, L: 2#       COORDINATION: Right: 22 sec., Left: <5 min. To place 7 pegs with increased compensation proximally in the trunk, and through reflexive associated reactions.  01/22/23 Right: 22 sec., Left: 3 min. & 4 sec.  03/03/23 Right: 22 sec., Left: 1 min. & 39 sec.  04/14/23   TBD  05/28/23 Right: 22 sec., Left:  5 min.  07/23/2023  Right: 33 sec., Left:  Pt. Is unable to grasp, and place pegs into the pegboard. Pt. Was able to remove 9 vertical pegs in 34 sec.      SENSATION: Light touch: WFL, proprioceptive awareness: Intact   EDEMA: N/A   MUSCLE TONE: LUE: Hypotonic   COGNITION: Overall cognitive status: WFL for tasks assessed. Pt. Is impulsive at times.   VISION: Subjective report: Pt. report having shingles affecting left eye  s/p 1 year. Has nerve pain Baseline vision: Wears glasses for reading only Visual history:  updated see clinical impression   VISION ASSESSMENT:    WFL for tasks performed   PERCEPTION: Intact   PRAXIS: Impaired: Motor planning   OBSERVATIONS:  Pt. more alert, and engaging since prior to the most recent hospitalization.     TODAY'S TREATMENT:    Measurements were obtained, and goals were reviewed with the Pt.        PATIENT EDUCATION: Education details: Use of calendar for better awareness of what day it is. Person educated: Patient and Spouse Education method: Medical illustrator Education comprehension: verbalized understanding,  returned demonstration, and needs further education     HOME EXERCISE PROGRAM:    Reviewed activities at home to promote isolated 2nd digit extension.    GOALS: Goals reviewed with patient? Yes   SHORT TERM GOALS: Target date:  07/09/2023         1. Patient will be independent with home exercise program for the left upper extremity Baseline: 07/23/2023: Pt. continues to require assist. 05/28/2023: Pt. Requires assist 04/09/2023: Pt. Continues to consistently attempt to engage her hand at hand 02/01/2023: Pt. Consistently attempts to perform HEPs independently. No current home exercise program Goal status: Ongoing     LONG TERM GOALS: Target date:  08/20/2023     1.  Patient will improve left shoulder strength by 2 mm grades to be able to sustain UE's in elevation long enough to wash her hair.  Baseline:  07/23/2023: Left shoulder flexion: 2/5, abduction: 2/5. 05/28/2023: Left shoulder flexion: 3-/5, abduction: 3-/5. Pt. has an old left shoulder injury limiting progression with left shoulder strength 05/26/2023: Pt. continues to present with limitiations in sustaining LUE elevation long enough to perform hair care thoroughly 04/09/2023: Pt. is limited with sustaining LUE elevation long enough to perform hair care thoroughly. 03/03/2023: Left shoulder flexion: 3-/5, abduction: 3-/5 01/22/2023: Left shoulder flexion: 3-/5, abduction: 3-/5 Eval: Left shoulder flexion: 3/5, abduction: 3-/5 Goal status: Revised, to continue   2.  Patient will improve left shoulder active abduction to be able to comb her hair Baseline: 07/23/2023: Left shoulder flexion: 60(70) abduction: 65(78) 05/28/2023: left shoulder flexion: 108 abduction: 85 Pt. Has an old left shoulder injury limiting progression with left shoulder ROM 05/26/2023:  5/10 left shoulder pain with ROM limits using it functionally during hair care. 04/09/2023: Pt. Presents with difficulty abducting her left shoulder enough to thoroughly complete haircare.  03/03/2023: Shoulder abduction: 85 01/22/2023: Shoulder abduction: 85 Eval: Left shoulder abduction is 80(108) Goal status: Revised, to continue   3.  Patient will independently button her shirt with modified independence. Baseline: 07/23/2023 Pt. presents with difficulty buttoning her shirt. 05/28/2023: pt. Continues to work towards progressing with buttoning 05/26/2023:  Pt. Continues to progress towards buttoning. 04/09/2023: Pt. Continues to progress towards buttoning. 03/03/2023: Pt. Continues to have difficulty with buttoning. 01/22/2023: Pt. continues to have difficulty. Eval: Patient has difficulty.  Goal status: Ongoing   4.  Patient well improve left grip strength in preparation for securely holding flowers. Baseline: 07/23/2023: Grip strength: Right: 5#, Left: 2# 05/28/2023: Right: 26#, Left: 13#  pt. Presents with difficulty securely holding flowers in her left hand. 05/26/2023: TBD 04/09/2023: Pt. Is able to hold, and hike pants with the left hand, continues to have difficulty with securely holding flowers.   03/03/2023: left grip strength: 13# 01/22/2023: Left: 12# Eval: Pt. Is unable to securely hold flowers. Goal status: Ongoing   5.  Pt. will independently recall adaptive  strategies for performing ADL tasks including: flossing teeth, donning bra, applying makeup. Baseline: 07/23/2023: Continue 05/28/2023: Pt. Needs continued education about adaptive strategies.05/26/2023: Continue 04/09/2023: Continue3/26/2024: Continue 01/22/2023: Pt. continues to benefit from education about adaptive strategies during ADLs, and IADLs. Eval: Pt. to be provided with adaptive strategies. Goal status: Ongoing   6.  Pt. will improve FOTO score by 2 points to reflect patient perceived performance improvement assessment specific ADLs  and IADLs Baseline: 6/44/0347: FOTO score: 57 05/28/2023: FOTO score 59 05/26/2023: TBD 5/02/204:  TBD 03/03/23: FOTO 62 Eval: 57 Goal status: Ongoing  7.  Pt. will improve left hand  coordination skills in order to be able to handle, and sort utensils in a drawer.     Baseline: 07/23/2023: Right: 33 sec., Left:  Pt. Is unable to grasp, and place pegs into the pegboard. Pt. Was able to remove 9 vertical pegs in 34 sec. 05/28/2023: Left:  5 min. 05/26/2023 Pt. Continues to present with difficulty handling and sorting utensils. 04/14/2023: 56 04/09/2023: TBD 03/03/23: 1 min. & 39 01/22/2022: Left: 3 min. & 4 sec. Eval: Pt. has difficulty sorting, and placing utensils with the left hand. Left FMC : >5 min. For 7 pegs on the 9 hole peg test.    Goal Status:  Ongoing  8. Pt. will improve active left 2nd digit extension to be able able to isolate her 2nd digit in preparation for pressing/pushing buttons on appliances, phones, or remotes. Baseline: 07/23/2023: Pt. Continues to work on  consistency with isolating left 2nd digit extension 05/28/2023: Pt. Continues to improve with left 2nd digit extension 05/26/2023: Improving with left 2nd digit extension. 04/09/2023: Pt. Is progressing with isolating left 2nd digit extension, however continues to present with limited increased flexor tone. 03/03/23: Pt. Continues to work on improving consistency with 2nd digit extension to press the remote. 01/22/2023: is able to perform full digit extension, although 2nd digit is slow to extend s 2/2 flexor tone. Pt.  Eval: Pt. is able to is unable to actively perform full digit extension Goal status:  Ongoing   ASSESSMENT:   CLINICAL IMPRESSION:  Pt./caregiver reports having had a really bad week last week, and Pt. Has now started on stronger medications to treat a UTI. Pt. reports one of the medications has been causing nausea, and vomiting. Pt. reports feeling better today than last week. Pt. Presents with decreased ROM, and increased pain in the left shoulder, as well as decreased strength in BUEs, decreased grip strength, pinch strength, and FMC skills bilaterally today. Pt. continues to benefit from skilled OT  services to work on improving BUE strength, and coordination skills, as well as left shoulder, elbow, and wrist strength, improving motor control and coordination in order to prepare the left upper extremity and hand for functional reaching and engaging the upper left extremity when performing hair care, buttoning, holding flowers, cutting food, and manipulating utensils to set the table as well as adaptive strategies during daily IADL care.     PERFORMANCE DEFICITS in functional skills including ADLs, IADLs, coordination, proprioception, ROM, strength, FMC, and GMC, cognitive skills including memory, and psychosocial skills including coping strategies, environmental adaptation, interpersonal interactions, and routines and behaviors.    IMPAIRMENTS are limiting patient from ADLs, IADLs, education, leisure, and social participation.    COMORBIDITIES may have co-morbidities  that affects occupational performance. Patient will benefit from skilled OT to address above impairments and improve overall function.   MODIFICATION OR ASSISTANCE TO COMPLETE EVALUATION: Min-Moderate modification of tasks or assist with assess necessary to complete an evaluation.   OT OCCUPATIONAL PROFILE AND HISTORY: Detailed assessment: Review of records and additional review of physical, cognitive, psychosocial history related to current functional performance.   CLINICAL DECISION MAKING: Moderate - several treatment options, min-mod task modification necessary   REHAB POTENTIAL: Good   EVALUATION COMPLEXITY: Moderate      PLAN: OT FREQUENCY: 2x/week   OT DURATION: 12 weeks   PLANNED INTERVENTIONS: self care/ADL training, therapeutic exercise, therapeutic activity, neuromuscular re-education, manual therapy, passive range of motion, functional mobility training, electrical stimulation, and paraffin   RECOMMENDED OTHER SERVICES: PT   CONSULTED AND AGREED WITH PLAN OF CARE: Patient and family member/caregiver    PLAN FOR NEXT SESSION: Initiate OT treatment   Olegario Messier, MS, OTR/L  8/15/22024

## 2023-07-28 ENCOUNTER — Ambulatory Visit: Payer: Medicare HMO | Admitting: Occupational Therapy

## 2023-07-28 DIAGNOSIS — R531 Weakness: Secondary | ICD-10-CM | POA: Diagnosis not present

## 2023-07-28 DIAGNOSIS — R2681 Unsteadiness on feet: Secondary | ICD-10-CM | POA: Diagnosis not present

## 2023-07-28 DIAGNOSIS — M6281 Muscle weakness (generalized): Secondary | ICD-10-CM

## 2023-07-28 DIAGNOSIS — I6381 Other cerebral infarction due to occlusion or stenosis of small artery: Secondary | ICD-10-CM | POA: Diagnosis not present

## 2023-07-28 DIAGNOSIS — R278 Other lack of coordination: Secondary | ICD-10-CM | POA: Diagnosis not present

## 2023-07-28 DIAGNOSIS — R262 Difficulty in walking, not elsewhere classified: Secondary | ICD-10-CM | POA: Diagnosis not present

## 2023-07-29 NOTE — Therapy (Signed)
Occupational Therapy Treatment Note    [name] Name: [name] MRN: [id number] DOB:08/10/1941, 82 y.o., female Today's Date: 07/29/2023  PCP: Dr. Judithann Sheen REFERRING PROVIDER:  Dr. Judithann Sheen  END OF SESSION:  PLAN:   OT End of Session - 07/29/23 0859     Visit Number 51    Number of Visits 72    Date for OT Re-Evaluation 08/20/23    Authorization Time Period Progress report period starting 12/16/2022    OT Start Time 1630    OT Stop Time 1700    OT Time Calculation (min) 30 min    Activity Tolerance [name] tolerated treatment well    Behavior During Therapy WFL for tasks assessed/performed                         Past Medical History:  Diagnosis Date   Anemia    Arthritis    Asthma    uses inhaler just prior to surgery to avoid attack   Back pain    from previous injury   Complication of anesthesia    has woken  up during 2 different surgery   Depression    no current issue/treatment; situation   Gallstones    GERD (gastroesophageal reflux disease)    Hiatal hernia    [name] does NOT have nerve/muscle disease   History of kidney stones    HLD (hyperlipidemia)    HTN (hypertension)    Hypothyroidism    Kidney stones    Knee pain    Nausea and vomiting 10/15/2022   Non-diabetic pancreatic hormone dysfunction years   pt. 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   Pancreatitis    Pneumonia    Seizures (HCC)    caused by dye injected during a procedure   Shortness of breath    with exertion   Sinus problem    frequent infections/congestion   Stroke (HCC) 2021   reports having CVA in 2021 and having mini strokes before that   Thyroid disease    Past Surgical History:  Procedure Laterality Date   ABDOMINAL HYSTERECTOMY     APPENDECTOMY     CARPAL TUNNEL RELEASE  10+ years ago   bilateral   EYE SURGERY  3 yrs ago   bilateral cataracts   FOOT OSTEOTOMY  6 weeks ago   Left foot: great, 2nd & 3rd   FOOT OSTEOTOMY  5 years ago   Right great  toe   HAND SURGERY Bilateral 2011-most recent   multiple hand surgeries, 2 on left, 3 on right   KNEE ARTHROPLASTY Right 04/28/2022   Procedure: COMPUTER ASSISTED TOTAL KNEE ARTHROPLASTY;  Surgeon: Donato Heinz, MD;  Location: [facility];  Service: Orthopedics;  Laterality: Right;   LOOP RECORDER INSERTION N/A 05/16/2020   Procedure: LOOP RECORDER INSERTION;  Surgeon: Marcina Millard, MD;  Location: [facility];  Service: Cardiovascular;  Laterality: N/A;   NASAL SINUS SURGERY  most recent 7-8 yrs ago   7 sinus surgeries    TRIGGER FINGER RELEASE  11/19/2011   Procedure: RELEASE TRIGGER FINGER/A-1 PULLEY;  Surgeon: Nicki Reaper, MD;  Location: [facility];  Service: Orthopedics;  Laterality: Right;  release a-1 pulley right index finger and cyst removal   WRIST GANGLION EXCISION  1980's   right   [name] Active Problem List   Diagnosis Date Noted   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 11/27/2022   Hypomagnesemia 11/27/2022   Occlusion of right  vertebral artery 11/23/2022   HLD (hyperlipidemia) 11/23/2022   Asthma 11/23/2022   Depression with anxiety 11/23/2022   Chronic diastolic CHF (congestive heart failure) (HCC) 11/23/2022   Normocytic anemia 11/23/2022   Aspiration pneumonia (HCC) 11/23/2022   AKI (acute kidney injury) (HCC) 11/23/2022   Abdominal pain 11/23/2022   Mesenteric mass 11/23/2022   Coffee ground emesis 11/23/2022   Nausea and vomiting 10/15/2022   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   Fatigue 09/17/2022   Left hemiparesis (HCC) 09/17/2022   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HCC) 08/22/2022   GERD (gastroesophageal reflux disease) 08/21/2022   Agitation 08/20/2022   Acute left-sided weakness 08/20/2022   Expressive aphasia    Stroke (HCC) 08/19/2022   Leukocytosis 08/19/2022   History of urticaria 04/28/2022   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   Primary osteoarthritis of left knee 02/24/2022   Primary osteoarthritis of  right knee 02/24/2022   Lumbar spondylolysis 04/12/2020   History of CVA (cerebrovascular accident) 03/26/2020   Low back pain radiating to right lower extremity 03/21/2020   B12 deficiency 03/06/2020   Positive anti-CCP test 12/21/2019   Arthralgia 12/13/2019   Dermatitis 12/13/2019   Rheumatoid factor positive 12/13/2019   Essential hypertension 12/11/2018   Palpitations 12/11/2018   Acquired hypothyroidism 11/10/2018   Arthritis of knee 09/17/2016   Anxiety 11/22/2014   Asthma without status asthmaticus 11/22/2014   Benign neoplasm of colon, unspecified 11/22/2014   Environmental allergies 11/22/2014   Hypertriglyceridemia 11/22/2014   Hypokalemia 11/22/2014   Personal history of disease of skin and subcutaneous tissue 11/22/2014   REFERRING DIAG: CVA   THERAPY DIAG:  Muscle weakness (generalized)   Other lack of coordination   Rationale for Evaluation and Treatment Rehabilitation   SUBJECTIVE:   Pt. reports doing better today. Pt. was late for the session today 2/2 Pt. waking up late.   SUBJECTIVE STATEMENT:   Pt accompanied by: significant other   PERTINENT HISTORY: [name] is an 63 year-old female who was admitted to [facility] on 11/23/2022 with a cerebellar CVA. 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 Pt. Was admitted to Inpatient Rehab from 12/21-1/02/2023. 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  [name] underwent inpatient rehabilitation for 2 weeks.  [name] was assessed and was scheduled for a knee replacement on 08/29/2022 however had to cancel it due to having had a CVA.  [name] had a recent fall 2 days after discharging from inpatient rehab. Past Medical History includes: Knee replacement, essential HTN, hypokalemia, leukocytosis, seizures, positive anti-- CCP test, anxiety disorder, mini strokes.  [name] had shingles with left eye nerve pain s/p 1 year ago.    PRECAUTIONS:  Fall   WEIGHT BEARING RESTRICTIONS No   PAIN:  Are you having pain? Left shoulder discomfort initially-NR, improved with soft tissue massage, ROM, and there.Ex.   FALLS: Has [name] fallen in last 6 months? Yes. Number of falls 1   LIVING ENVIRONMENT: Lives with: Lives with Spouse Lives in: House/apartment Stairs: 2 storey home, resides on the first floor.  External: 2 stairs front no rails, and 6 in back with rails Has following equipment at home: Single point cane, Walker - 2 wheeled, Environmental consultant - 4 wheeled, Shower bench, and bed side commode   PLOF: Independent   [name] GOALS  To Regain the use of her left arm   OBJECTIVE:    HAND DOMINANCE: Right   ADLs: Overall ADLs: Husband assists pt. as needed Transfers/ambulation related to ADLs:Pt. Uses a 3  wheeled walker with Husband assist. Eating: Pt. Is independent with the right hand. Pt. has difficulty cutting food. Grooming: Pt. is using her right hand, however has difficulty sustaining her LUE in elevation to assist with haircare. UB Dressing: Pt. Is independent donning a pullover shirt, and button down shirt. Has difficulty with buttoning, LB Dressing:  Independent donning pants, and socks. Difficulty tying shoes. Toileting: Independent Bathing: Pt. Is able to engage her right hand. Tub Shower transfers: Supervision Equipment: See above for equipment     IADLs: Shopping:  Has not had the opportunity for grocery shopping yet Light housekeeping: Husband is assisting with light house keeping Meal Prep:  Dependent Community mobility: Relies of family/friends Medication management: Husband assisting with weekly pillbox set-up, and administering medication. Financial management: TBD Handwriting: 75% legible   MOBILITY STATUS: Hx of falls   POSTURE COMMENTS:  No Significant postural limitations Sitting balance: supported sitting balance WFL   ACTIVITY TOLERANCE: Activity tolerance:  Fatigues in greater than 30 min.     FUNCTIONAL OUTCOME MEASURES: FOTO: 57   UPPER EXTREMITY ROM      Active ROM Right Eval: WFL Left eval Left  01/22/23 Left  03/03/23 Left  04/14/23 Left  05/28/2023 Left 07/23/2023  Shoulder flexion   132 100 108 108 108 60(70)  Shoulder abduction   80 85 85 85 85 65(78)  Shoulder adduction           Shoulder extension           Shoulder internal rotation           Shoulder external rotation           Elbow flexion   140 140 [facility] WFL [facility] WFL  Elbow extension   WNL WNL [facility] WFL WFL  Wrist flexion   65 68      Wrist extension   -10 20 24  32 32 -10  Wrist ulnar deviation     12 10 14 14 10   Wrist radial deviation     8 14 14 16 12   Wrist pronation           Wrist supination           (Blank rows = not tested)   Left digit flexion to [facility]: 2nd: 0cm, 3rd: 0cm, 4th: 0cm, 5th: 0cm   Limited Left full 2nd digit extension     UPPER EXTREMITY MMT:      MMT Right Eval: 4+/5 overall Right 07/23/2023 Left Eval Left 01/22/23 Left 03/03/23 Left  04/14/2023 Left 05/28/2023 Left 07/23/2023  Shoulder flexion   3+/5 3/5 3-/5 3-/5 3-/5 3-/5 2/5  Shoulder abduction   3+/5 3-/5 3-/5 3-/5 3-/5 3-/5 2/5  Shoulder adduction            Shoulder extension            Shoulder internal rotation            Shoulder external rotation            Middle trapezius            Lower trapezius            Elbow flexion   4/5 3+/5 4/5 N/A 4/5 4/5 3+/5  Elbow extension   4/5 3+/5 4/4 N/A 4/5 4/5 3+/5  Wrist flexion            Wrist extension   4/5 2-/5 3-/5 3-/5 3/5 3+/5 3+/5  Wrist ulnar deviation  Wrist radial deviation            Wrist pronation            Wrist supination            (Blank rows = not tested)   HAND FUNCTION: Grip strength: Right: 26#, Left: 10# Pinch strength: Right 8#, Left: 3#, 3 Pt. Pinch strength: Right: 9#, L: 2#  01/22/2023 Grip strength: Right: 26#, Left: 12# Pinch strength:  Pinch meter used at the initial eval has been sent out for recalibration    03/03/2023 Grip strength: Right: 26#, Left: 13# Pinch strength:  Pinch meter used at the initial eval has been sent out for recalibration  04/14/2023: Grip strength: Right: 26#, Left: 13# Pinch strength:   Right 8#, Left: 4#, 3 Pt. Pinch strength: Right: 9#, L: 3#    05/28/2023: Grip strength: Right: 26#, Left: 13# Pinch strength:   Right 8#, Left: 2#, 3 Pt. Pinch strength: Right: 9#, L: 4#  07/23/2023  Grip strength: Right: 5#, Left: 2# Pinch strength:   Right 6#, Left: 2#, 3 Pt. Pinch strength: Right: 5#, L: 2#       COORDINATION: Right: 22 sec., Left: <5 min. To place 7 pegs with increased compensation proximally in the trunk, and through reflexive associated reactions.  01/22/23 Right: 22 sec., Left: 3 min. & 4 sec.  03/03/23 Right: 22 sec., Left: 1 min. & 39 sec.  04/14/23   TBD  05/28/23 Right: 22 sec., Left:  5 min.  07/23/2023  Right: 33 sec., Left:  Pt. Is unable to grasp, and place pegs into the pegboard. Pt. Was able to remove 9 vertical pegs in 34 sec.      SENSATION: Light touch: WFL, proprioceptive awareness: Intact   EDEMA: N/A   MUSCLE TONE: LUE: Hypotonic   COGNITION: Overall cognitive status: WFL for tasks assessed. Pt. Is impulsive at times.   VISION: Subjective report: Pt. report having shingles affecting left eye  s/p 1 year. Has nerve pain Baseline vision: Wears glasses for reading only Visual history:  updated see clinical impression   VISION ASSESSMENT:    WFL for tasks performed   PERCEPTION: Intact   PRAXIS: Impaired: Motor planning   OBSERVATIONS:  Pt. more alert, and engaging since prior to the most recent hospitalization.     TODAY'S TREATMENT:    Therapeutic Ex.    Pt. tolerated AROM/AAROM/PROM in the left shoulder. Pt. worked on [employer], and reciprocal motion using the UBE while seated for 8 min. with no resistance. Constant monitoring was provided.    Manual Therapy:   Pt. tolerated soft tissue massage to  the scapular, and shoulder musculature. Pt. performed scapular mobilizations for scapular elevation, depression, abduction/adduction in sitting . Manual therapy was performed independent of, and in preparation for ROM, there. Ex, and functional hand use.         [name] EDUCATION: Education details: Use of calendar for better awareness of what day it is. Person educated: [name] and Spouse Education method: Medical illustrator Education comprehension: verbalized understanding, returned demonstration, and needs further education     HOME EXERCISE PROGRAM:    Reviewed activities at home to promote isolated 2nd digit extension.    GOALS: Goals reviewed with [name]? Yes   SHORT TERM GOALS: Target date:  07/09/2023         1. [name] will be independent with home exercise program for the left upper extremity Baseline: 07/23/2023: Pt. continues to require assist. 05/28/2023: Pt.  Requires assist 04/09/2023: Pt. Continues to consistently attempt to engage her hand at hand 02/01/2023: Pt. Consistently attempts to perform HEPs independently. No current home exercise program Goal status: Ongoing     LONG TERM GOALS: Target date:  08/20/2023     1.  [name] will improve left shoulder strength by 2 mm grades to be able to sustain UE's in elevation long enough to wash her hair.  Baseline: 07/23/2023: Left shoulder flexion: 2/5, abduction: 2/5. 05/28/2023: Left shoulder flexion: 3-/5, abduction: 3-/5. Pt. has an old left shoulder injury limiting progression with left shoulder strength 05/26/2023: Pt. continues to present with limitiations in sustaining LUE elevation long enough to perform hair care thoroughly 04/09/2023: Pt. is limited with sustaining LUE elevation long enough to perform hair care thoroughly. 03/03/2023: Left shoulder flexion: 3-/5, abduction: 3-/5 01/22/2023: Left shoulder flexion: 3-/5, abduction: 3-/5 Eval: Left shoulder flexion: 3/5, abduction: 3-/5 Goal status: Revised, to  continue   2.  [name] will improve left shoulder active abduction to be able to comb her hair Baseline: 07/23/2023: Left shoulder flexion: 60(70) abduction: 65(78) 05/28/2023: left shoulder flexion: 108 abduction: 85 Pt. Has an old left shoulder injury limiting progression with left shoulder ROM 05/26/2023:  5/10 left shoulder pain with ROM limits using it functionally during hair care. 04/09/2023: Pt. Presents with difficulty abducting her left shoulder enough to thoroughly complete haircare. 03/03/2023: Shoulder abduction: 85 01/22/2023: Shoulder abduction: 85 Eval: Left shoulder abduction is 80(108) Goal status: Revised, to continue   3.  [name] will independently button her shirt with modified independence. Baseline: 07/23/2023 Pt. presents with difficulty buttoning her shirt. 05/28/2023: pt. Continues to work towards progressing with buttoning 05/26/2023:  Pt. Continues to progress towards buttoning. 04/09/2023: Pt. Continues to progress towards buttoning. 03/03/2023: Pt. Continues to have difficulty with buttoning. 01/22/2023: Pt. continues to have difficulty. Eval: [name] has difficulty.  Goal status: Ongoing   4.  [name] well improve left grip strength in preparation for securely holding flowers. Baseline: 07/23/2023: Grip strength: Right: 5#, Left: 2# 05/28/2023: Right: 26#, Left: 13#  pt. Presents with difficulty securely holding flowers in her left hand. 05/26/2023: TBD 04/09/2023: Pt. Is able to hold, and hike pants with the left hand, continues to have difficulty with securely holding flowers.   03/03/2023: left grip strength: 13# 01/22/2023: Left: 12# Eval: Pt. Is unable to securely hold flowers. Goal status: Ongoing   5.  Pt. will independently recall adaptive  strategies for performing ADL tasks including: flossing teeth, donning bra, applying makeup. Baseline: 07/23/2023: Continue 05/28/2023: Pt. Needs continued education about adaptive strategies.05/26/2023: Continue 04/09/2023: Continue3/26/2024:  Continue 01/22/2023: Pt. continues to benefit from education about adaptive strategies during ADLs, and IADLs. Eval: Pt. to be provided with adaptive strategies. Goal status: Ongoing   6.  Pt. will improve FOTO score by 2 points to reflect [name] perceived performance improvement assessment specific ADLs  and IADLs Baseline: 08/05/5620: FOTO score: 57 05/28/2023: FOTO score 59 05/26/2023: TBD 5/02/204:  TBD 03/03/23: FOTO 62 Eval: 57 Goal status: Ongoing  7.  Pt. will improve left hand coordination skills in order to be able to handle, and sort utensils in a drawer.     Baseline: 07/23/2023: Right: 33 sec., Left:  Pt. Is unable to grasp, and place pegs into the pegboard. Pt. Was able to remove 9 vertical pegs in 34 sec. 05/28/2023: Left:  5 min. 05/26/2023 Pt. Continues to present with difficulty handling and sorting utensils. 04/14/2023: 56 04/09/2023: TBD 03/03/23: 1 min. & 39 01/22/2022: Left: 3  min. & 4 sec. Eval: Pt. has difficulty sorting, and placing utensils with the left hand. Left FMC : >5 min. For 7 pegs on the 9 hole peg test.    Goal Status:  Ongoing  8. Pt. will improve active left 2nd digit extension to be able able to isolate her 2nd digit in preparation for pressing/pushing buttons on appliances, phones, or remotes. Baseline: 07/23/2023: Pt. Continues to work on consistency with isolating left 2nd digit extension 05/28/2023: Pt. Continues to improve with left 2nd digit extension 05/26/2023: Improving with left 2nd digit extension. 04/09/2023: Pt. Is progressing with isolating left 2nd digit extension, however continues to present with limited increased flexor tone. 03/03/23: Pt. Continues to work on improving consistency with 2nd digit extension to press the remote. 01/22/2023: is able to perform full digit extension, although 2nd digit is slow to extend s 2/2 flexor tone. Pt.  Eval: Pt. is able to is unable to actively perform full digit extension Goal status:  Ongoing   ASSESSMENT:   CLINICAL  IMPRESSION:  Pt. reports feeling better today.  Pt. tolerated the UE ther. Ex well, and reports feeling better following treatment today. Pt. continues to benefit from skilled OT services to work on improving BUE strength, and coordination skills, as well as left shoulder, elbow, and wrist strength, improving motor control and coordination in order to prepare the left upper extremity and hand for functional reaching and engaging the upper left extremity when performing hair care, buttoning, holding flowers, cutting food, and manipulating utensils to set the table as well as adaptive strategies during daily IADL care.     PERFORMANCE DEFICITS in functional skills including ADLs, IADLs, coordination, proprioception, ROM, strength, FMC, and GMC, cognitive skills including memory, and psychosocial skills including coping strategies, environmental adaptation, interpersonal interactions, and routines and behaviors.    IMPAIRMENTS are limiting [name] from ADLs, IADLs, education, leisure, and social participation.    COMORBIDITIES may have co-morbidities  that affects occupational performance. [name] will benefit from skilled OT to address above impairments and improve overall function.   MODIFICATION OR ASSISTANCE TO COMPLETE EVALUATION: Min-Moderate modification of tasks or assist with assess necessary to complete an evaluation.   OT OCCUPATIONAL PROFILE AND HISTORY: Detailed assessment: Review of records and additional review of physical, cognitive, psychosocial history related to current functional performance.   CLINICAL DECISION MAKING: Moderate - several treatment options, min-mod task modification necessary   REHAB POTENTIAL: Good   EVALUATION COMPLEXITY: Moderate      PLAN: OT FREQUENCY: 2x/week   OT DURATION: 12 weeks   PLANNED INTERVENTIONS: self care/ADL training, therapeutic exercise, therapeutic activity, neuromuscular re-education, manual therapy, passive range of motion,  functional mobility training, electrical stimulation, and paraffin   RECOMMENDED OTHER SERVICES: PT   CONSULTED AND AGREED WITH PLAN OF CARE: [name] and family member/caregiver   PLAN FOR NEXT SESSION: Initiate OT treatment   Olegario Messier, MS, OTR/L  8/20/22024

## 2023-07-30 ENCOUNTER — Ambulatory Visit: Payer: Medicare HMO | Admitting: Occupational Therapy

## 2023-07-30 DIAGNOSIS — R531 Weakness: Secondary | ICD-10-CM | POA: Diagnosis not present

## 2023-07-30 DIAGNOSIS — R2681 Unsteadiness on feet: Secondary | ICD-10-CM | POA: Diagnosis not present

## 2023-07-30 DIAGNOSIS — R262 Difficulty in walking, not elsewhere classified: Secondary | ICD-10-CM | POA: Diagnosis not present

## 2023-07-30 DIAGNOSIS — I6381 Other cerebral infarction due to occlusion or stenosis of small artery: Secondary | ICD-10-CM | POA: Diagnosis not present

## 2023-07-30 DIAGNOSIS — R278 Other lack of coordination: Secondary | ICD-10-CM | POA: Diagnosis not present

## 2023-07-30 DIAGNOSIS — M6281 Muscle weakness (generalized): Secondary | ICD-10-CM

## 2023-07-30 NOTE — Therapy (Signed)
Occupational Therapy Treatment Note    Patient Name: [name] MRN: [id number] DOB:[birth year], 82 y.o., female Today's Date: 07/30/2023  PCP: Dr. Judithann Sheen REFERRING PROVIDER:  Dr. Judithann Sheen  END OF SESSION:  PLAN:   OT End of Session - 07/30/23 1750     Visit Number 52    Number of Visits 72    Date for OT Re-Evaluation 08/20/23    OT Start Time 1620    OT Stop Time 1700    OT Time Calculation (min) 40 min    Equipment Utilized During Treatment Wheelchair    Activity Tolerance Patient tolerated treatment well    Behavior During Therapy WFL for tasks assessed/performed                         Past Medical History:  Diagnosis Date   Anemia    Arthritis    Asthma    uses inhaler just prior to surgery to avoid attack   Back pain    from previous injury   Complication of anesthesia    has woken  up during 2 different surgery   Depression    no current issue/treatment; situation   Gallstones    GERD (gastroesophageal reflux disease)    Hiatal hernia    patient does NOT have nerve/muscle disease   History of kidney stones    HLD (hyperlipidemia)    HTN (hypertension)    Hypothyroidism    Kidney stones    Knee pain    Nausea and vomiting 10/15/2022   Non-diabetic pancreatic hormone dysfunction years   pt. 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   Pancreatitis    Pneumonia    Seizures (HCC)    caused by dye injected during a procedure   Shortness of breath    with exertion   Sinus problem    frequent infections/congestion   Stroke (HCC) 2021   reports having CVA in 2021 and having mini strokes before that   Thyroid disease    Past Surgical History:  Procedure Laterality Date   ABDOMINAL HYSTERECTOMY     APPENDECTOMY     CARPAL TUNNEL RELEASE  10+ years ago   bilateral   EYE SURGERY  3 yrs ago   bilateral cataracts   FOOT OSTEOTOMY  6 weeks ago   Left foot: great, 2nd & 3rd   FOOT OSTEOTOMY  5 years ago   Right great toe   HAND SURGERY  Bilateral 2011-most recent   multiple hand surgeries, 2 on left, 3 on right   KNEE ARTHROPLASTY Right 04/28/2022   Procedure: COMPUTER ASSISTED TOTAL KNEE ARTHROPLASTY;  Surgeon: Donato Heinz, MD;  Location: [facility];  Service: Orthopedics;  Laterality: Right;   LOOP RECORDER INSERTION N/A 05/16/2020   Procedure: LOOP RECORDER INSERTION;  Surgeon: Marcina Millard, MD;  Location: [facility];  Service: Cardiovascular;  Laterality: N/A;   NASAL SINUS SURGERY  most recent 7-8 yrs ago   7 sinus surgeries    TRIGGER FINGER RELEASE  11/19/2011   Procedure: RELEASE TRIGGER FINGER/A-1 PULLEY;  Surgeon: Nicki Reaper, MD;  Location: [facility];  Service: Orthopedics;  Laterality: Right;  release a-1 pulley right index finger and cyst removal   WRIST GANGLION EXCISION  1980's   right   Patient Active Problem List   Diagnosis Date Noted   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 11/27/2022   Hypomagnesemia 11/27/2022   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  HLD (hyperlipidemia) 11/23/2022   Asthma 11/23/2022   Depression with anxiety 11/23/2022   Chronic diastolic CHF (congestive heart failure) (HCC) 11/23/2022   Normocytic anemia 11/23/2022   Aspiration pneumonia (HCC) 11/23/2022   AKI (acute kidney injury) (HCC) 11/23/2022   Abdominal pain 11/23/2022   Mesenteric mass 11/23/2022   Coffee ground emesis 11/23/2022   Nausea and vomiting 10/15/2022   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   Fatigue 09/17/2022   Left hemiparesis (HCC) 09/17/2022   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HCC) 08/22/2022   GERD (gastroesophageal reflux disease) 08/21/2022   Agitation 08/20/2022   Acute left-sided weakness 08/20/2022   Expressive aphasia    Stroke (HCC) 08/19/2022   Leukocytosis 08/19/2022   History of urticaria 04/28/2022   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   Primary osteoarthritis of left knee 02/24/2022   Primary osteoarthritis of right knee  02/24/2022   Lumbar spondylolysis 04/12/2020   History of CVA (cerebrovascular accident) 03/26/2020   Low back pain radiating to right lower extremity 03/21/2020   B12 deficiency 03/06/2020   Positive anti-CCP test 12/21/2019   Arthralgia 12/13/2019   Dermatitis 12/13/2019   Rheumatoid factor positive 12/13/2019   Essential hypertension 12/11/2018   Palpitations 12/11/2018   Acquired hypothyroidism 11/10/2018   Arthritis of knee 09/17/2016   Anxiety 11/22/2014   Asthma without status asthmaticus 11/22/2014   Benign neoplasm of colon, unspecified 11/22/2014   Environmental allergies 11/22/2014   Hypertriglyceridemia 11/22/2014   Hypokalemia 11/22/2014   Personal history of disease of skin and subcutaneous tissue 11/22/2014   REFERRING DIAG: CVA   THERAPY DIAG:  Muscle weakness (generalized)   Other lack of coordination   Rationale for Evaluation and Treatment Rehabilitation   SUBJECTIVE:    Pt. reports that she is starting to feel better today.   SUBJECTIVE STATEMENT:   Pt accompanied by: significant other   PERTINENT HISTORY: Patient is an 82 year-old female who was admitted to [facility] on 11/23/2022 with a cerebellar CVA. 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 Pt. Was admitted to Inpatient Rehab from 12/21-1/02/2023. 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  Patient underwent inpatient rehabilitation for 2 weeks.  Patient was assessed and was scheduled for a knee replacement on 08/29/2022 however had to cancel it due to having had a CVA.  Patient had a recent fall 2 days after discharging from inpatient rehab. Past Medical History includes: Knee replacement, essential HTN, hypokalemia, leukocytosis, seizures, positive anti-- CCP test, anxiety disorder, mini strokes.  Patient had shingles with left eye nerve pain s/p 1 year ago.    PRECAUTIONS: Fall   WEIGHT BEARING RESTRICTIONS No    PAIN:  Are you having pain? 6/10 Left shoulder pain   FALLS: Has patient fallen in last 6 months? Yes. Number of falls 1   LIVING ENVIRONMENT: Lives with: Lives with Spouse Lives in: House/apartment Stairs: 2 storey home, resides on the first floor.  External: 2 stairs front no rails, and 6 in back with rails Has following equipment at home: Single point cane, Walker - 2 wheeled, Environmental consultant - 4 wheeled, Shower bench, and bed side commode   PLOF: Independent   PATIENT GOALS  To Regain the use of her left arm   OBJECTIVE:    HAND DOMINANCE: Right   ADLs: Overall ADLs: Husband assists pt. as needed Transfers/ambulation related to ADLs:Pt. Uses a 3 wheeled walker with Husband assist. Eating: Pt. Is independent with the right hand. Pt. has difficulty cutting food. Grooming:  Pt. is using her right hand, however has difficulty sustaining her LUE in elevation to assist with haircare. UB Dressing: Pt. Is independent donning a pullover shirt, and button down shirt. Has difficulty with buttoning, LB Dressing:  Independent donning pants, and socks. Difficulty tying shoes. Toileting: Independent Bathing: Pt. Is able to engage her right hand. Tub Shower transfers: Supervision Equipment: See above for equipment     IADLs: Shopping:  Has not had the opportunity for grocery shopping yet Light housekeeping: Husband is assisting with light house keeping Meal Prep:  Dependent Community mobility: Relies of family/friends Medication management: Husband assisting with weekly pillbox set-up, and administering medication. Financial management: TBD Handwriting: 75% legible   MOBILITY STATUS: Hx of falls   POSTURE COMMENTS:  No Significant postural limitations Sitting balance: supported sitting balance WFL   ACTIVITY TOLERANCE: Activity tolerance:  Fatigues in greater than 30 min.    FUNCTIONAL OUTCOME MEASURES: FOTO: 57   UPPER EXTREMITY ROM      Active ROM Right Eval: WFL Left eval Left   01/22/23 Left  03/03/23 Left  04/14/23 Left  05/28/2023 Left 07/23/2023  Shoulder flexion   132 100 108 108 108 60(70)  Shoulder abduction   80 85 85 85 85 65(78)  Shoulder adduction           Shoulder extension           Shoulder internal rotation           Shoulder external rotation           Elbow flexion   140 140 [facility] WFL [facility] WFL  Elbow extension   WNL WNL [facility] WFL WFL  Wrist flexion   65 68      Wrist extension   -10 20 24  32 32 -10  Wrist ulnar deviation     12 10 14 14 10   Wrist radial deviation     8 14 14 16 12   Wrist pronation           Wrist supination           (Blank rows = not tested)   Left digit flexion to [facility]: 2nd: 0cm, 3rd: 0cm, 4th: 0cm, 5th: 0cm   Limited Left full 2nd digit extension     UPPER EXTREMITY MMT:      MMT Right Eval: 4+/5 overall Right 07/23/2023 Left Eval Left 01/22/23 Left 03/03/23 Left  04/14/2023 Left 05/28/2023 Left 07/23/2023  Shoulder flexion   3+/5 3/5 3-/5 3-/5 3-/5 3-/5 2/5  Shoulder abduction   3+/5 3-/5 3-/5 3-/5 3-/5 3-/5 2/5  Shoulder adduction            Shoulder extension            Shoulder internal rotation            Shoulder external rotation            Middle trapezius            Lower trapezius            Elbow flexion   4/5 3+/5 4/5 N/A 4/5 4/5 3+/5  Elbow extension   4/5 3+/5 4/4 N/A 4/5 4/5 3+/5  Wrist flexion            Wrist extension   4/5 2-/5 3-/5 3-/5 3/5 3+/5 3+/5  Wrist ulnar deviation            Wrist radial deviation  Wrist pronation            Wrist supination            (Blank rows = not tested)   HAND FUNCTION: Grip strength: Right: 26#, Left: 10# Pinch strength: Right 8#, Left: 3#, 3 Pt. Pinch strength: Right: 9#, L: 2#  01/22/2023 Grip strength: Right: 26#, Left: 12# Pinch strength:  Pinch meter used at the initial eval has been sent out for recalibration   03/03/2023 Grip strength: Right: 26#, Left: 13# Pinch strength:  Pinch meter used at the initial eval has been sent out for  recalibration  04/14/2023: Grip strength: Right: 26#, Left: 13# Pinch strength:   Right 8#, Left: 4#, 3 Pt. Pinch strength: Right: 9#, L: 3#    05/28/2023: Grip strength: Right: 26#, Left: 13# Pinch strength:   Right 8#, Left: 2#, 3 Pt. Pinch strength: Right: 9#, L: 4#  07/23/2023  Grip strength: Right: 5#, Left: 2# Pinch strength:   Right 6#, Left: 2#, 3 Pt. Pinch strength: Right: 5#, L: 2#       COORDINATION: Right: 22 sec., Left: <5 min. To place 7 pegs with increased compensation proximally in the trunk, and through reflexive associated reactions.  01/22/23 Right: 22 sec., Left: 3 min. & 4 sec.  03/03/23 Right: 22 sec., Left: 1 min. & 39 sec.  04/14/23   TBD  05/28/23 Right: 22 sec., Left:  5 min.  07/23/2023  Right: 33 sec., Left:  Pt. Is unable to grasp, and place pegs into the pegboard. Pt. Was able to remove 9 vertical pegs in 34 sec.      SENSATION: Light touch: WFL, proprioceptive awareness: Intact   EDEMA: N/A   MUSCLE TONE: LUE: Hypotonic   COGNITION: Overall cognitive status: WFL for tasks assessed. Pt. Is impulsive at times.   VISION: Subjective report: Pt. report having shingles affecting left eye  s/p 1 year. Has nerve pain Baseline vision: Wears glasses for reading only Visual history:  updated see clinical impression   VISION ASSESSMENT:    WFL for tasks performed   PERCEPTION: Intact   PRAXIS: Impaired: Motor planning   OBSERVATIONS:  Pt. more alert, and engaging since prior to the most recent hospitalization.     TODAY'S TREATMENT:    Therapeutic Ex.    Pt. tolerated AROM/AAROM/PROM in the left shoulder. Pt. worked on [employer], and reciprocal motion using the UBE while seated for 8 min. with no resistance. Constant monitoring was provided.    Manual Therapy:   Pt. tolerated soft tissue massage to the scapular, and shoulder musculature. Pt. performed scapular mobilizations for scapular elevation, depression,  abduction/adduction in sitting . Manual therapy was performed independent of, and in preparation for ROM, there. Ex, and functional hand use.         PATIENT EDUCATION: Education details: Use of calendar for better awareness of what day it is. Person educated: Patient and Spouse Education method: Medical illustrator Education comprehension: verbalized understanding, returned demonstration, and needs further education     HOME EXERCISE PROGRAM:    Reviewed activities at home to promote isolated 2nd digit extension.    GOALS: Goals reviewed with patient? Yes   SHORT TERM GOALS: Target date:  07/09/2023         1. Patient will be independent with home exercise program for the left upper extremity Baseline: 07/23/2023: Pt. continues to require assist. 05/28/2023: Pt. Requires assist 04/09/2023: Pt. Continues to consistently attempt to engage her hand at hand  02/01/2023: Pt. Consistently attempts to perform HEPs independently. No current home exercise program Goal status: Ongoing     LONG TERM GOALS: Target date:  08/20/2023     1.  Patient will improve left shoulder strength by 2 mm grades to be able to sustain UE's in elevation long enough to wash her hair.  Baseline: 07/23/2023: Left shoulder flexion: 2/5, abduction: 2/5. 05/28/2023: Left shoulder flexion: 3-/5, abduction: 3-/5. Pt. has an old left shoulder injury limiting progression with left shoulder strength 05/26/2023: Pt. continues to present with limitiations in sustaining LUE elevation long enough to perform hair care thoroughly 04/09/2023: Pt. is limited with sustaining LUE elevation long enough to perform hair care thoroughly. 03/03/2023: Left shoulder flexion: 3-/5, abduction: 3-/5 01/22/2023: Left shoulder flexion: 3-/5, abduction: 3-/5 Eval: Left shoulder flexion: 3/5, abduction: 3-/5 Goal status: Revised, to continue   2.  Patient will improve left shoulder active abduction to be able to comb her hair Baseline: 07/23/2023:  Left shoulder flexion: 60(70) abduction: 65(78) 05/28/2023: left shoulder flexion: 108 abduction: 85 Pt. Has an old left shoulder injury limiting progression with left shoulder ROM 05/26/2023:  5/10 left shoulder pain with ROM limits using it functionally during hair care. 04/09/2023: Pt. Presents with difficulty abducting her left shoulder enough to thoroughly complete haircare. 03/03/2023: Shoulder abduction: 85 01/22/2023: Shoulder abduction: 85 Eval: Left shoulder abduction is 80(108) Goal status: Revised, to continue   3.  Patient will independently button her shirt with modified independence. Baseline: 07/23/2023 Pt. presents with difficulty buttoning her shirt. 05/28/2023: pt. Continues to work towards progressing with buttoning 05/26/2023:  Pt. Continues to progress towards buttoning. 04/09/2023: Pt. Continues to progress towards buttoning. 03/03/2023: Pt. Continues to have difficulty with buttoning. 01/22/2023: Pt. continues to have difficulty. Eval: Patient has difficulty.  Goal status: Ongoing   4.  Patient well improve left grip strength in preparation for securely holding flowers. Baseline: 07/23/2023: Grip strength: Right: 5#, Left: 2# 05/28/2023: Right: 26#, Left: 13#  pt. Presents with difficulty securely holding flowers in her left hand. 05/26/2023: TBD 04/09/2023: Pt. Is able to hold, and hike pants with the left hand, continues to have difficulty with securely holding flowers.   03/03/2023: left grip strength: 13# 01/22/2023: Left: 12# Eval: Pt. Is unable to securely hold flowers. Goal status: Ongoing   5.  Pt. will independently recall adaptive  strategies for performing ADL tasks including: flossing teeth, donning bra, applying makeup. Baseline: 07/23/2023: Continue 05/28/2023: Pt. Needs continued education about adaptive strategies.05/26/2023: Continue 04/09/2023: Continue3/26/2024: Continue 01/22/2023: Pt. continues to benefit from education about adaptive strategies during ADLs, and IADLs. Eval: Pt. to  be provided with adaptive strategies. Goal status: Ongoing   6.  Pt. will improve FOTO score by 2 points to reflect patient perceived performance improvement assessment specific ADLs  and IADLs Baseline: 03/16/8118: FOTO score: 57 05/28/2023: FOTO score 59 05/26/2023: TBD 5/02/204:  TBD 03/03/23: FOTO 62 Eval: 57 Goal status: Ongoing  7.  Pt. will improve left hand coordination skills in order to be able to handle, and sort utensils in a drawer.     Baseline: 07/23/2023: Right: 33 sec., Left:  Pt. Is unable to grasp, and place pegs into the pegboard. Pt. Was able to remove 9 vertical pegs in 34 sec. 05/28/2023: Left:  5 min. 05/26/2023 Pt. Continues to present with difficulty handling and sorting utensils. 04/14/2023: 56 04/09/2023: TBD 03/03/23: 1 min. & 39 01/22/2022: Left: 3 min. & 4 sec. Eval: Pt. has difficulty sorting, and placing utensils with the  left hand. Left FMC : >5 min. For 7 pegs on the 9 hole peg test.    Goal Status:  Ongoing  8. Pt. will improve active left 2nd digit extension to be able able to isolate her 2nd digit in preparation for pressing/pushing buttons on appliances, phones, or remotes. Baseline: 07/23/2023: Pt. Continues to work on consistency with isolating left 2nd digit extension 05/28/2023: Pt. Continues to improve with left 2nd digit extension 05/26/2023: Improving with left 2nd digit extension. 04/09/2023: Pt. Is progressing with isolating left 2nd digit extension, however continues to present with limited increased flexor tone. 03/03/23: Pt. Continues to work on improving consistency with 2nd digit extension to press the remote. 01/22/2023: is able to perform full digit extension, although 2nd digit is slow to extend s 2/2 flexor tone. Pt.  Eval: Pt. is able to is unable to actively perform full digit extension Goal status:  Ongoing   ASSESSMENT:   CLINICAL IMPRESSION:  Pt. reports that she is really starting to feel better today. Pt. presents with 6/10 left shoulder pain. Pt.  continues to tolerate the UE ther. Ex., and soft tissue massage  to the left shoulder well today following moist heat modality. Pt. continues to benefit from skilled OT services to work on improving BUE strength, and coordination skills, as well as left shoulder, elbow, and wrist strength, improving motor control and coordination in order to prepare the left upper extremity and hand for functional reaching and engaging the upper left extremity when performing hair care, buttoning, holding flowers, cutting food, and manipulating utensils to set the table as well as adaptive strategies during daily IADL care.     PERFORMANCE DEFICITS in functional skills including ADLs, IADLs, coordination, proprioception, ROM, strength, FMC, and GMC, cognitive skills including memory, and psychosocial skills including coping strategies, environmental adaptation, interpersonal interactions, and routines and behaviors.    IMPAIRMENTS are limiting patient from ADLs, IADLs, education, leisure, and social participation.    COMORBIDITIES may have co-morbidities  that affects occupational performance. Patient will benefit from skilled OT to address above impairments and improve overall function.   MODIFICATION OR ASSISTANCE TO COMPLETE EVALUATION: Min-Moderate modification of tasks or assist with assess necessary to complete an evaluation.   OT OCCUPATIONAL PROFILE AND HISTORY: Detailed assessment: Review of records and additional review of physical, cognitive, psychosocial history related to current functional performance.   CLINICAL DECISION MAKING: Moderate - several treatment options, min-mod task modification necessary   REHAB POTENTIAL: Good   EVALUATION COMPLEXITY: Moderate      PLAN: OT FREQUENCY: 2x/week   OT DURATION: 12 weeks   PLANNED INTERVENTIONS: self care/ADL training, therapeutic exercise, therapeutic activity, neuromuscular re-education, manual therapy, passive range of motion, functional mobility  training, electrical stimulation, and paraffin   RECOMMENDED OTHER SERVICES: PT   CONSULTED AND AGREED WITH PLAN OF CARE: Patient and family member/caregiver   PLAN FOR NEXT SESSION: Initiate OT treatment   Olegario Messier, MS, OTR/L  8/22/22024

## 2023-07-31 ENCOUNTER — Encounter: Payer: Self-pay | Admitting: Physical Medicine & Rehabilitation

## 2023-07-31 ENCOUNTER — Encounter: Payer: Medicare HMO | Attending: Physical Medicine & Rehabilitation | Admitting: Physical Medicine & Rehabilitation

## 2023-07-31 VITALS — BP 105/70 | HR 73 | Ht 59.0 in | Wt 109.4 lb

## 2023-07-31 DIAGNOSIS — G8194 Hemiplegia, unspecified affecting left nondominant side: Secondary | ICD-10-CM | POA: Insufficient documentation

## 2023-07-31 NOTE — Progress Notes (Signed)
Subjective:    Patient ID: [name], female    DOB: [birth year], 82 y.o.   MRN: [id number] 82 y.o. right-handed female with history of recent stroke with left-sided weakness maintained on aspirin and Plavix with loop recorder placement received inpatient rehab services 08/22/2022 - 09/05/2022 discharge to home ambulating supervision level, hypertension hyperlipidemia, pancreatitis, chronic anemia, diastolic congestive heart failure.  Per chart review lives with spouse.  Presented to [facility] 11/23/2022 with increasing left-sided weakness nausea vomiting nonspecific abdominal pain.  Cranial CT scan negative for acute changes.  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  New compared to 08/19/2022.  No other intracranial arterial occlusion or high-grade stenosis.  Patient did not receive tPA.  MRI of the brain showed extensive acute nonhemorrhagic infarct involving the posterior inferior cerebellum bilaterally right greater than left.  A 9 mm right occipital pole infarction.  Punctate white matter infarct in the left occipital lobe.  Additional punctate cortical infarct in the medial right occipital lobe more superiorly.  Echocardiogram with ejection fraction of 55 to 60% no wall motion abnormalities grade 1 diastolic dysfunction.  CT abdomen pelvis showed 8.4 cm masslike inflammatory edematous changes in the anterior pelvic fat cephalad to the urinary bladder possibly contusion versus neoplasm.  Admission chemistries unremarkable except glucose 179 creatinine 1.79 urine drug screen negative.  Neurology follow-up initially placed on intravenous heparin currently remained on aspirin and Plavix 21 days then Plavix alone for CVA prophylaxis.  Lovenox added for DVT prophylaxis.  Gastroenterology services Dr. Timothy Lasso in regards to coffee-ground emesis with CT abdomen pelvis reviewed not felt to be GI bleed consider CTA if  worsening but no plan for endoscopy or further workup at this time.  Patient was cleared to continue aspirin and Plavix therapy.  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  AKI improved with gentle IV fluids latest creatinine 1.28.  Question aspiration pneumonia initially on Unasyn transition to Augmentin finishing course.  Cardiology interrogated loop recorder no events noted.  Therapy evaluations completed due to patient decreased functional ability left-sided weakness was admitted for a comprehensive rehab program. HPI  Viral illness that limited therapy participation , felt tired for ~67month   UTI on Cefdinir, may have had some GI side effects as well, had bowel urgency with incont episode x 1 soft stool Needs help with toileting , dressing  Has had nausea episode with position changes , we discussed this may be related to cerebellar CVA  Pain Inventory Average Pain 0 Pain Right Now 0 My pain is  no pain  LOCATION OF PAIN  no pain  BOWEL Number of stools per week: 2 Oral laxative use Yes  Type of laxative Miralax   BLADDER Normal and Pads Leakage with cough   Mobility use a walker how many minutes can you walk? unknown ability to climb steps?  yes transfers alone Do you have any goals in this area?  no  Function retired I need assistance with the following:  feeding, dressing, bathing, toileting, meal prep, household duties, and shopping Do you have any goals in this area?  yes  Neuro/Psych weakness dizziness  Prior Studies Any changes since last visit?  no  Physicians involved in your care Any changes since last visit?  no   Family History  Problem Relation Age of Onset   Anesthesia problems Father        "  bad lungs" couldn't wake him up   Heart disease Father    Heart attack Father 69   Breast cancer Maternal Grandmother    Breast cancer Maternal Aunt        x 2   Breast cancer Cousin    Pancreatic cancer Cousin     Heart attack Paternal Uncle    Stroke Paternal Grandfather    Social History   Socioeconomic History   Marital status: Married    Spouse name: [name]   Number of children: 1   Years of education: Not on file   Highest education level: Not on file  Occupational History   Occupation: retired  Tobacco Use   Smoking status: Never   Smokeless tobacco: Never  Vaping Use   Vaping status: Never Used  Substance and Sexual Activity   Alcohol use: Not Currently    Alcohol/week: 0.0 - 1.0 standard drinks of alcohol    Comment: wine once every 2-3 months   Drug use: Never   Sexual activity: Not on file  Other Topics Concern   Not on file  Social History Narrative   Not on file   Social Determinants of Health   Financial Resource Strain: Low Risk  (07/08/2023)   Received from [facility]   Overall Financial Resource Strain (CARDIA)    Difficulty of Paying Living Expenses: Not hard at all  Food Insecurity: No Food Insecurity (07/08/2023)   Received from [facility]   Hunger Vital Sign    Worried About Running Out of Food in the Last Year: Never true    Ran Out of Food in the Last Year: Never true  Transportation Needs: No Transportation Needs (07/08/2023)   Received from [facility] - Transportation    In the past 12 months, has lack of transportation kept you from medical appointments or from getting medications?: No    Lack of Transportation (Non-Medical): No  Physical Activity: Not on file  Stress: Not on file  Social Connections: Not on file   Past Surgical History:  Procedure Laterality Date   ABDOMINAL HYSTERECTOMY     APPENDECTOMY     CARPAL TUNNEL RELEASE  10+ years ago   bilateral   EYE SURGERY  3 yrs ago   bilateral cataracts   FOOT OSTEOTOMY  6 weeks ago   Left foot: great, 2nd & 3rd   FOOT OSTEOTOMY  5 years ago   Right great toe   HAND SURGERY Bilateral 2011-most recent   multiple hand surgeries, 2  on left, 3 on right   KNEE ARTHROPLASTY Right 04/28/2022   Procedure: COMPUTER ASSISTED TOTAL KNEE ARTHROPLASTY;  Surgeon: Donato Heinz, MD;  Location: [facility] ORS;  Service: Orthopedics;  Laterality: Right;   LOOP RECORDER INSERTION N/A 05/16/2020   Procedure: LOOP RECORDER INSERTION;  Surgeon: Marcina Millard, MD;  Location: [facility];  Service: Cardiovascular;  Laterality: N/A;   NASAL SINUS SURGERY  most recent 7-8 yrs ago   7 sinus surgeries    TRIGGER FINGER RELEASE  11/19/2011   Procedure: RELEASE TRIGGER FINGER/A-1 PULLEY;  Surgeon: Nicki Reaper, MD;  Location: [facility];  Service: Orthopedics;  Laterality: Right;  release a-1 pulley right index finger and cyst removal   WRIST GANGLION EXCISION  1980's   right   Past Medical History:  Diagnosis Date   Anemia    Arthritis    Asthma    uses inhaler just  prior to surgery to avoid attack   Back pain    from previous injury   Complication of anesthesia    has woken  up during 2 different surgery   Depression    no current issue/treatment; situation   Gallstones    GERD (gastroesophageal reflux disease)    Hiatal hernia    patient does NOT have nerve/muscle disease   History of kidney stones    HLD (hyperlipidemia)    HTN (hypertension)    Hypothyroidism    Kidney stones    Knee pain    Nausea and vomiting 10/15/2022   Non-diabetic pancreatic hormone dysfunction years   pt. 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   Pancreatitis    Pneumonia    Seizures (HCC)    caused by dye injected during a procedure   Shortness of breath    with exertion   Sinus problem    frequent infections/congestion   Stroke (HCC) 2021   reports having CVA in 2021 and having mini strokes before that   Thyroid disease    BP 105/70   Pulse 73   Ht 4\' 11"  (1.499 m)   Wt 109 lb 6.4 oz (49.6 kg)   SpO2 96%   BMI 22.10 kg/m   Opioid Risk Score:   Fall Risk Score:  `1  Depression screen [facility] 2/9      07/31/2023    3:27 PM 05/01/2023    3:26 PM 01/30/2023    1:53 PM 10/15/2022    1:02 PM 09/17/2022    1:49 PM  Depression screen PHQ 2/9  Decreased Interest 0 0 0 0 0  Down, Depressed, Hopeless 0 0 0 0 0  PHQ - 2 Score 0 0 0 0 0  Altered sleeping     3  Tired, decreased energy     3  Change in appetite     0  Feeling bad or failure about yourself      0  Trouble concentrating     0  Moving slowly or fidgety/restless     0  Suicidal thoughts     0  PHQ-9 Score     6  Difficult doing work/chores     Somewhat difficult    Review of Systems  Neurological:  Positive for dizziness and weakness.  All other systems reviewed and are negative.      Objective:   Physical Exam  No acute distress Mood and affect appropriate Speech without dysarthria or aphasia, patient is verbose Motor strength is 4-/5 in left deltoid bicep tricep grip flexor knee extensor ankle dorsiflexor 5/5 on the right side. There is mildly diminished finger thumb opposition on the left side compared to the right side. Tone MAS 2 at the index finger flexors on the left side otherwise MAS 1 at the wrist and finger flexors on the left side MAS 0 at the elbow flexor. 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  Setback in therapy related to viral illness,  pt has OP PT , OT, scheduled for the next 3 mo   Left knee endstage OA, per Neuro Dr Sherryll Burger ok to have ortho consult and plan for L TKR

## 2023-08-04 ENCOUNTER — Ambulatory Visit: Payer: Medicare HMO | Admitting: Occupational Therapy

## 2023-08-04 DIAGNOSIS — R278 Other lack of coordination: Secondary | ICD-10-CM | POA: Diagnosis not present

## 2023-08-04 DIAGNOSIS — R531 Weakness: Secondary | ICD-10-CM | POA: Diagnosis not present

## 2023-08-04 DIAGNOSIS — M6281 Muscle weakness (generalized): Secondary | ICD-10-CM | POA: Diagnosis not present

## 2023-08-04 DIAGNOSIS — I6381 Other cerebral infarction due to occlusion or stenosis of small artery: Secondary | ICD-10-CM | POA: Diagnosis not present

## 2023-08-04 DIAGNOSIS — R2681 Unsteadiness on feet: Secondary | ICD-10-CM | POA: Diagnosis not present

## 2023-08-04 DIAGNOSIS — R262 Difficulty in walking, not elsewhere classified: Secondary | ICD-10-CM | POA: Diagnosis not present

## 2023-08-04 NOTE — Therapy (Signed)
Occupational Therapy Treatment Note    Patient Name: [name] MRN: [id number] DOB:[birth year], 82 y.o., female Today's Date: 08/04/2023  PCP: Dr. Judithann Sheen REFERRING PROVIDER:  Dr. Judithann Sheen  END OF SESSION:  PLAN:   OT End of Session - 08/04/23 1804     Visit Number [id number]    Date for OT Re-Evaluation 08/20/23    OT Start Time 1623    OT Stop Time 1708    OT Time Calculation (min) 45 min    Activity Tolerance Patient tolerated treatment well    Behavior During Therapy WFL for tasks assessed/performed                         Past Medical History:  Diagnosis Date   Anemia    Arthritis    Asthma    uses inhaler just prior to surgery to avoid attack   Back pain    from previous injury   Complication of anesthesia    has woken  up during 2 different surgery   Depression    no current issue/treatment; situation   Gallstones    GERD (gastroesophageal reflux disease)    Hiatal hernia    patient does NOT have nerve/muscle disease   History of kidney stones    HLD (hyperlipidemia)    HTN (hypertension)    Hypothyroidism    Kidney stones    Knee pain    Nausea and vomiting 10/15/2022   Non-diabetic pancreatic hormone dysfunction years   pt. 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   Pancreatitis    Pneumonia    Seizures (HCC)    caused by dye injected during a procedure   Shortness of breath    with exertion   Sinus problem    frequent infections/congestion   Stroke (HCC) 2021   reports having CVA in 2021 and having mini strokes before that   Thyroid disease    Past Surgical History:  Procedure Laterality Date   ABDOMINAL HYSTERECTOMY     APPENDECTOMY     CARPAL TUNNEL RELEASE  10+ years ago   bilateral   EYE SURGERY  3 yrs ago   bilateral cataracts   FOOT OSTEOTOMY  6 weeks ago   Left foot: great, 2nd & 3rd   FOOT OSTEOTOMY  5 years ago   Right great toe   HAND SURGERY Bilateral 2011-most recent   multiple hand surgeries, 2 on left, 3 on right    KNEE ARTHROPLASTY Right 04/28/2022   Procedure: COMPUTER ASSISTED TOTAL KNEE ARTHROPLASTY;  Surgeon: Donato Heinz, MD;  Location: [facility];  Service: Orthopedics;  Laterality: Right;   LOOP RECORDER INSERTION N/A 05/16/2020   Procedure: LOOP RECORDER INSERTION;  Surgeon: Marcina Millard, MD;  Location: [facility];  Service: Cardiovascular;  Laterality: N/A;   NASAL SINUS SURGERY  most recent 7-8 yrs ago   7 sinus surgeries    TRIGGER FINGER RELEASE  11/19/2011   Procedure: RELEASE TRIGGER FINGER/A-1 PULLEY;  Surgeon: Nicki Reaper, MD;  Location: [facility];  Service: Orthopedics;  Laterality: Right;  release a-1 pulley right index finger and cyst removal   WRIST GANGLION EXCISION  1980's   right   Patient Active Problem List   Diagnosis Date Noted   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 11/27/2022   Hypomagnesemia 11/27/2022   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   HLD (hyperlipidemia) 11/23/2022   Asthma 11/23/2022   Depression with anxiety 11/23/2022  Chronic diastolic CHF (congestive heart failure) (HCC) 11/23/2022   Normocytic anemia 11/23/2022   Aspiration pneumonia (HCC) 11/23/2022   AKI (acute kidney injury) (HCC) 11/23/2022   Abdominal pain 11/23/2022   Mesenteric mass 11/23/2022   Coffee ground emesis 11/23/2022   Nausea and vomiting 10/15/2022   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   Fatigue 09/17/2022   Left hemiparesis (HCC) 09/17/2022   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HCC) 08/22/2022   GERD (gastroesophageal reflux disease) 08/21/2022   Agitation 08/20/2022   Acute left-sided weakness 08/20/2022   Expressive aphasia    Stroke (HCC) 08/19/2022   Leukocytosis 08/19/2022   History of urticaria 04/28/2022   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   Primary osteoarthritis of left knee 02/24/2022   Primary osteoarthritis of right knee 02/24/2022   Lumbar spondylolysis 04/12/2020   History of CVA (cerebrovascular  accident) 03/26/2020   Low back pain radiating to right lower extremity 03/21/2020   B12 deficiency 03/06/2020   Positive anti-CCP test 12/21/2019   Arthralgia 12/13/2019   Dermatitis 12/13/2019   Rheumatoid factor positive 12/13/2019   Essential hypertension 12/11/2018   Palpitations 12/11/2018   Acquired hypothyroidism 11/10/2018   Arthritis of knee 09/17/2016   Anxiety 11/22/2014   Asthma without status asthmaticus 11/22/2014   Benign neoplasm of colon, unspecified 11/22/2014   Environmental allergies 11/22/2014   Hypertriglyceridemia 11/22/2014   Hypokalemia 11/22/2014   Personal history of disease of skin and subcutaneous tissue 11/22/2014   REFERRING DIAG: CVA   THERAPY DIAG:  Muscle weakness (generalized)   Other lack of coordination   Rationale for Evaluation and Treatment Rehabilitation   SUBJECTIVE:    Pt. reports that she is starting to feel better today.   SUBJECTIVE STATEMENT:   Pt accompanied by: significant other   PERTINENT HISTORY: Patient is an 82 year-old female who was admitted to [facility] on 11/23/2022 with a cerebellar CVA. 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 Pt. Was admitted to Inpatient Rehab from 12/21-1/02/2023. 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  Patient underwent inpatient rehabilitation for 2 weeks.  Patient was assessed and was scheduled for a knee replacement on 08/29/2022 however had to cancel it due to having had a CVA.  Patient had a recent fall 2 days after discharging from inpatient rehab. Past Medical History includes: Knee replacement, essential HTN, hypokalemia, leukocytosis, seizures, positive anti-- CCP test, anxiety disorder, mini strokes.  Patient had shingles with left eye nerve pain s/p 1 year ago.    PRECAUTIONS: Fall   WEIGHT BEARING RESTRICTIONS No   PAIN:  Are you having pain? 8/10 Left shoulder pain, and stiffness initially.    FALLS: Has patient fallen in last 6 months? Yes. Number of falls 1   LIVING ENVIRONMENT: Lives with: Lives with Spouse Lives in: House/apartment Stairs: 2 storey home, resides on the first floor.  External: 2 stairs front no rails, and 6 in back with rails Has following equipment at home: Single point cane, Walker - 2 wheeled, Environmental consultant - 4 wheeled, Shower bench, and bed side commode   PLOF: Independent   PATIENT GOALS  To Regain the use of her left arm   OBJECTIVE:    HAND DOMINANCE: Right   ADLs: Overall ADLs: Husband assists pt. as needed Transfers/ambulation related to ADLs:Pt. Uses a 3 wheeled walker with Husband assist. Eating: Pt. Is independent with the right hand. Pt. has difficulty cutting food. Grooming: Pt. is using her right hand, however has difficulty sustaining her LUE  in elevation to assist with haircare. UB Dressing: Pt. Is independent donning a pullover shirt, and button down shirt. Has difficulty with buttoning, LB Dressing:  Independent donning pants, and socks. Difficulty tying shoes. Toileting: Independent Bathing: Pt. Is able to engage her right hand. Tub Shower transfers: Supervision Equipment: See above for equipment     IADLs: Shopping:  Has not had the opportunity for grocery shopping yet Light housekeeping: Husband is assisting with light house keeping Meal Prep:  Dependent Community mobility: Relies of family/friends Medication management: Husband assisting with weekly pillbox set-up, and administering medication. Financial management: TBD Handwriting: 75% legible   MOBILITY STATUS: Hx of falls   POSTURE COMMENTS:  No Significant postural limitations Sitting balance: supported sitting balance WFL   ACTIVITY TOLERANCE: Activity tolerance:  Fatigues in greater than 30 min.    FUNCTIONAL OUTCOME MEASURES: FOTO: 57   UPPER EXTREMITY ROM      Active ROM Right Eval: WFL Left eval Left  01/22/23 Left  03/03/23 Left  04/14/23 Left  05/28/2023  Left 07/23/2023  Shoulder flexion   132 100 108 108 108 60(70)  Shoulder abduction   80 85 85 85 85 65(78)  Shoulder adduction           Shoulder extension           Shoulder internal rotation           Shoulder external rotation           Elbow flexion   140 140 [facility] WFL [facility] WFL  Elbow extension   WNL WNL [facility] WFL WFL  Wrist flexion   65 68      Wrist extension   -10 20 24  32 32 -10  Wrist ulnar deviation     12 10 14 14 10   Wrist radial deviation     8 14 14 16 12   Wrist pronation           Wrist supination           (Blank rows = not tested)   Left digit flexion to [facility]: 2nd: 0cm, 3rd: 0cm, 4th: 0cm, 5th: 0cm   Limited Left full 2nd digit extension     UPPER EXTREMITY MMT:      MMT Right Eval: 4+/5 overall Right 07/23/2023 Left Eval Left 01/22/23 Left 03/03/23 Left  04/14/2023 Left 05/28/2023 Left 07/23/2023  Shoulder flexion   3+/5 3/5 3-/5 3-/5 3-/5 3-/5 2/5  Shoulder abduction   3+/5 3-/5 3-/5 3-/5 3-/5 3-/5 2/5  Shoulder adduction            Shoulder extension            Shoulder internal rotation            Shoulder external rotation            Middle trapezius            Lower trapezius            Elbow flexion   4/5 3+/5 4/5 N/A 4/5 4/5 3+/5  Elbow extension   4/5 3+/5 4/4 N/A 4/5 4/5 3+/5  Wrist flexion            Wrist extension   4/5 2-/5 3-/5 3-/5 3/5 3+/5 3+/5  Wrist ulnar deviation            Wrist radial deviation            Wrist pronation  Wrist supination            (Blank rows = not tested)   HAND FUNCTION: Grip strength: Right: 26#, Left: 10# Pinch strength: Right 8#, Left: 3#, 3 Pt. Pinch strength: Right: 9#, L: 2#  01/22/2023 Grip strength: Right: 26#, Left: 12# Pinch strength:  Pinch meter used at the initial eval has been sent out for recalibration   03/03/2023 Grip strength: Right: 26#, Left: 13# Pinch strength:  Pinch meter used at the initial eval has been sent out for recalibration  04/14/2023: Grip strength: Right: 26#,  Left: 13# Pinch strength:   Right 8#, Left: 4#, 3 Pt. Pinch strength: Right: 9#, L: 3#    05/28/2023: Grip strength: Right: 26#, Left: 13# Pinch strength:   Right 8#, Left: 2#, 3 Pt. Pinch strength: Right: 9#, L: 4#  07/23/2023  Grip strength: Right: 5#, Left: 2# Pinch strength:   Right 6#, Left: 2#, 3 Pt. Pinch strength: Right: 5#, L: 2#       COORDINATION: Right: 22 sec., Left: <5 min. To place 7 pegs with increased compensation proximally in the trunk, and through reflexive associated reactions.  01/22/23 Right: 22 sec., Left: 3 min. & 4 sec.  03/03/23 Right: 22 sec., Left: 1 min. & 39 sec.  04/14/23   TBD  05/28/23 Right: 22 sec., Left:  5 min.  07/23/2023  Right: 33 sec., Left:  Pt. Is unable to grasp, and place pegs into the pegboard. Pt. Was able to remove 9 vertical pegs in 34 sec.      SENSATION: Light touch: WFL, proprioceptive awareness: Intact   EDEMA: N/A   MUSCLE TONE: LUE: Hypotonic   COGNITION: Overall cognitive status: WFL for tasks assessed. Pt. Is impulsive at times.   VISION: Subjective report: Pt. report having shingles affecting left eye  s/p 1 year. Has nerve pain Baseline vision: Wears glasses for reading only Visual history:  updated see clinical impression   VISION ASSESSMENT:    WFL for tasks performed   PERCEPTION: Intact   PRAXIS: Impaired: Motor planning   OBSERVATIONS:  Pt. more alert, and engaging since prior to the most recent hospitalization.     TODAY'S TREATMENT:    Therapeutic Ex.    Pt. tolerated AROM/AAROM/PROM in the left shoulder. Pt. worked on [employer], and reciprocal motion using the UBE while seated for 8 min. with no resistance. Constant monitoring was provided.    Manual Therapy:   Pt. tolerated soft tissue massage to the scapular, and shoulder musculature. Pt. performed scapular mobilizations for scapular elevation, depression, abduction/adduction in sitting . Manual therapy was performed  independent of, and in preparation for ROM, there. Ex, and functional hand use.         PATIENT EDUCATION: Education details: Use of calendar for better awareness of what day it is. Person educated: Patient and Spouse Education method: Medical illustrator Education comprehension: verbalized understanding, returned demonstration, and needs further education     HOME EXERCISE PROGRAM:    Reviewed activities at home to promote isolated 2nd digit extension.    GOALS: Goals reviewed with patient? Yes   SHORT TERM GOALS: Target date:  07/09/2023         1. Patient will be independent with home exercise program for the left upper extremity Baseline: 07/23/2023: Pt. continues to require assist. 05/28/2023: Pt. Requires assist 04/09/2023: Pt. Continues to consistently attempt to engage her hand at hand 02/01/2023: Pt. Consistently attempts to perform HEPs independently. No current home exercise program  Goal status: Ongoing     LONG TERM GOALS: Target date:  08/20/2023     1.  Patient will improve left shoulder strength by 2 mm grades to be able to sustain UE's in elevation long enough to wash her hair.  Baseline: 07/23/2023: Left shoulder flexion: 2/5, abduction: 2/5. 05/28/2023: Left shoulder flexion: 3-/5, abduction: 3-/5. Pt. has an old left shoulder injury limiting progression with left shoulder strength 05/26/2023: Pt. continues to present with limitiations in sustaining LUE elevation long enough to perform hair care thoroughly 04/09/2023: Pt. is limited with sustaining LUE elevation long enough to perform hair care thoroughly. 03/03/2023: Left shoulder flexion: 3-/5, abduction: 3-/5 01/22/2023: Left shoulder flexion: 3-/5, abduction: 3-/5 Eval: Left shoulder flexion: 3/5, abduction: 3-/5 Goal status: Revised, to continue   2.  Patient will improve left shoulder active abduction to be able to comb her hair Baseline: 07/23/2023: Left shoulder flexion: 60(70) abduction: 65(78) 05/28/2023: left  shoulder flexion: 108 abduction: 85 Pt. Has an old left shoulder injury limiting progression with left shoulder ROM 05/26/2023:  5/10 left shoulder pain with ROM limits using it functionally during hair care. 04/09/2023: Pt. Presents with difficulty abducting her left shoulder enough to thoroughly complete haircare. 03/03/2023: Shoulder abduction: 85 01/22/2023: Shoulder abduction: 85 Eval: Left shoulder abduction is 80(108) Goal status: Revised, to continue   3.  Patient will independently button her shirt with modified independence. Baseline: 07/23/2023 Pt. presents with difficulty buttoning her shirt. 05/28/2023: pt. Continues to work towards progressing with buttoning 05/26/2023:  Pt. Continues to progress towards buttoning. 04/09/2023: Pt. Continues to progress towards buttoning. 03/03/2023: Pt. Continues to have difficulty with buttoning. 01/22/2023: Pt. continues to have difficulty. Eval: Patient has difficulty.  Goal status: Ongoing   4.  Patient well improve left grip strength in preparation for securely holding flowers. Baseline: 07/23/2023: Grip strength: Right: 5#, Left: 2# 05/28/2023: Right: 26#, Left: 13#  pt. Presents with difficulty securely holding flowers in her left hand. 05/26/2023: TBD 04/09/2023: Pt. Is able to hold, and hike pants with the left hand, continues to have difficulty with securely holding flowers.   03/03/2023: left grip strength: 13# 01/22/2023: Left: 12# Eval: Pt. Is unable to securely hold flowers. Goal status: Ongoing   5.  Pt. will independently recall adaptive  strategies for performing ADL tasks including: flossing teeth, donning bra, applying makeup. Baseline: 07/23/2023: Continue 05/28/2023: Pt. Needs continued education about adaptive strategies.05/26/2023: Continue 04/09/2023: Continue3/26/2024: Continue 01/22/2023: Pt. continues to benefit from education about adaptive strategies during ADLs, and IADLs. Eval: Pt. to be provided with adaptive strategies. Goal status: Ongoing    6.  Pt. will improve FOTO score by 2 points to reflect patient perceived performance improvement assessment specific ADLs  and IADLs Baseline: 1/61/0960: FOTO score: 57 05/28/2023: FOTO score 59 05/26/2023: TBD 5/02/204:  TBD 03/03/23: FOTO 62 Eval: 57 Goal status: Ongoing  7.  Pt. will improve left hand coordination skills in order to be able to handle, and sort utensils in a drawer.     Baseline: 07/23/2023: Right: 33 sec., Left:  Pt. Is unable to grasp, and place pegs into the pegboard. Pt. Was able to remove 9 vertical pegs in 34 sec. 05/28/2023: Left:  5 min. 05/26/2023 Pt. Continues to present with difficulty handling and sorting utensils. 04/14/2023: 56 04/09/2023: TBD 03/03/23: 1 min. & 39 01/22/2022: Left: 3 min. & 4 sec. Eval: Pt. has difficulty sorting, and placing utensils with the left hand. Left FMC : >5 min. For 7 pegs on the 9  hole peg test.    Goal Status:  Ongoing  8. Pt. will improve active left 2nd digit extension to be able able to isolate her 2nd digit in preparation for pressing/pushing buttons on appliances, phones, or remotes. Baseline: 07/23/2023: Pt. Continues to work on consistency with isolating left 2nd digit extension 05/28/2023: Pt. Continues to improve with left 2nd digit extension 05/26/2023: Improving with left 2nd digit extension. 04/09/2023: Pt. Is progressing with isolating left 2nd digit extension, however continues to present with limited increased flexor tone. 03/03/23: Pt. Continues to work on improving consistency with 2nd digit extension to press the remote. 01/22/2023: is able to perform full digit extension, although 2nd digit is slow to extend s 2/2 flexor tone. Pt.  Eval: Pt. is able to is unable to actively perform full digit extension Goal status:  Ongoing   ASSESSMENT:   CLINICAL IMPRESSION:  Pt. presents with 8/10 left shoulder pain, and stiffness initially, however after treatment Pt. Reports more mobility, less stiffiness, and less pain. Pt. continues to  tolerate the UE ther. Ex., and soft tissue massage  to the left shoulder well today following moist heat modality. Pt. continues to benefit from skilled OT services to work on improving BUE strength, and coordination skills, as well as left shoulder, elbow, and wrist strength, improving motor control and coordination in order to prepare the left upper extremity and hand for functional reaching and engaging the upper left extremity when performing hair care, buttoning, holding flowers, cutting food, and manipulating utensils to set the table as well as adaptive strategies during daily IADL care.     PERFORMANCE DEFICITS in functional skills including ADLs, IADLs, coordination, proprioception, ROM, strength, FMC, and GMC, cognitive skills including memory, and psychosocial skills including coping strategies, environmental adaptation, interpersonal interactions, and routines and behaviors.    IMPAIRMENTS are limiting patient from ADLs, IADLs, education, leisure, and social participation.    COMORBIDITIES may have co-morbidities  that affects occupational performance. Patient will benefit from skilled OT to address above impairments and improve overall function.   MODIFICATION OR ASSISTANCE TO COMPLETE EVALUATION: Min-Moderate modification of tasks or assist with assess necessary to complete an evaluation.   OT OCCUPATIONAL PROFILE AND HISTORY: Detailed assessment: Review of records and additional review of physical, cognitive, psychosocial history related to current functional performance.   CLINICAL DECISION MAKING: Moderate - several treatment options, min-mod task modification necessary   REHAB POTENTIAL: Good   EVALUATION COMPLEXITY: Moderate      PLAN: OT FREQUENCY: 2x/week   OT DURATION: 12 weeks   PLANNED INTERVENTIONS: self care/ADL training, therapeutic exercise, therapeutic activity, neuromuscular re-education, manual therapy, passive range of motion, functional mobility training,  electrical stimulation, and paraffin   RECOMMENDED OTHER SERVICES: PT   CONSULTED AND AGREED WITH PLAN OF CARE: Patient and family member/caregiver   PLAN FOR NEXT SESSION: Initiate OT treatment   Olegario Messier, MS, OTR/L  8/27/22024

## 2023-08-06 ENCOUNTER — Ambulatory Visit: Payer: Medicare HMO | Admitting: Physical Therapy

## 2023-08-06 ENCOUNTER — Ambulatory Visit: Payer: Medicare HMO

## 2023-08-06 DIAGNOSIS — I6381 Other cerebral infarction due to occlusion or stenosis of small artery: Secondary | ICD-10-CM

## 2023-08-06 DIAGNOSIS — R262 Difficulty in walking, not elsewhere classified: Secondary | ICD-10-CM | POA: Diagnosis not present

## 2023-08-06 DIAGNOSIS — M6281 Muscle weakness (generalized): Secondary | ICD-10-CM | POA: Diagnosis not present

## 2023-08-06 DIAGNOSIS — R278 Other lack of coordination: Secondary | ICD-10-CM

## 2023-08-06 DIAGNOSIS — R2681 Unsteadiness on feet: Secondary | ICD-10-CM

## 2023-08-06 DIAGNOSIS — R531 Weakness: Secondary | ICD-10-CM

## 2023-08-06 NOTE — Therapy (Signed)
OUTPATIENT PHYSICAL THERAPY NEURO TREATMENT/RECERT    Patient Name: [name] MRN: [id number] DOB:09/12/1941, 82 y.o., female Today's Date: 08/06/2023   PCP: Aram Beecham, D MD REFERRING PROVIDER: Jacquelynn Cree PA   END OF SESSION:  PT End of Session - 08/06/23 1008     Visit Number 15    Number of Visits 37    Date for PT Re-Evaluation 09/29/23    Authorization Type 3/10 eval 04/14/23    Progress Note Due on Visit 20    PT Start Time 1618    PT Stop Time 1700    PT Time Calculation (min) 42 min    Equipment Utilized During Treatment Gait belt    Activity Tolerance Patient tolerated treatment well    Behavior During Therapy WFL for tasks assessed/performed                  Past Medical History:  Diagnosis Date   Anemia    Arthritis    Asthma    uses inhaler just prior to surgery to avoid attack   Back pain    from previous injury   Complication of anesthesia    has woken  up during 2 different surgery   Depression    no current issue/treatment; situation   Gallstones    GERD (gastroesophageal reflux disease)    Hiatal hernia    patient does NOT have nerve/muscle disease   History of kidney stones    HLD (hyperlipidemia)    HTN (hypertension)    Hypothyroidism    Kidney stones    Knee pain    Nausea and vomiting 10/15/2022   Non-diabetic pancreatic hormone dysfunction years   pt. 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   Pancreatitis    Pneumonia    Seizures (HCC)    caused by dye injected during a procedure   Shortness of breath    with exertion   Sinus problem    frequent infections/congestion   Stroke (HCC) 2021   reports having CVA in 2021 and having mini strokes before that   Thyroid disease    Past Surgical History:  Procedure Laterality Date   ABDOMINAL HYSTERECTOMY     APPENDECTOMY     CARPAL TUNNEL RELEASE  10+ years ago   bilateral   EYE SURGERY  3 yrs ago   bilateral cataracts   FOOT OSTEOTOMY  6 weeks ago   Left  foot: great, 2nd & 3rd   FOOT OSTEOTOMY  5 years ago   Right great toe   HAND SURGERY Bilateral 2011-most recent   multiple hand surgeries, 2 on left, 3 on right   KNEE ARTHROPLASTY Right 04/28/2022   Procedure: COMPUTER ASSISTED TOTAL KNEE ARTHROPLASTY;  Surgeon: Donato Heinz, MD;  Location: [facility];  Service: Orthopedics;  Laterality: Right;   LOOP RECORDER INSERTION N/A 05/16/2020   Procedure: LOOP RECORDER INSERTION;  Surgeon: Marcina Millard, MD;  Location: [facility];  Service: Cardiovascular;  Laterality: N/A;   NASAL SINUS SURGERY  most recent 7-8 yrs ago   7 sinus surgeries    TRIGGER FINGER RELEASE  11/19/2011   Procedure: RELEASE TRIGGER FINGER/A-1 PULLEY;  Surgeon: Nicki Reaper, MD;  Location: [facility];  Service: Orthopedics;  Laterality: Right;  release a-1 pulley right index finger and cyst removal   WRIST GANGLION EXCISION  1980's   right   Patient Active Problem List   Diagnosis Date Noted   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 11/27/2022   Hypomagnesemia 11/27/2022   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   HLD (hyperlipidemia) 11/23/2022   Asthma 11/23/2022   Depression with anxiety 11/23/2022   Chronic diastolic CHF (congestive heart failure) (HCC) 11/23/2022   Normocytic anemia 11/23/2022   Aspiration pneumonia (HCC) 11/23/2022   AKI (acute kidney injury) (HCC) 11/23/2022   Abdominal pain 11/23/2022   Mesenteric mass 11/23/2022   Coffee ground emesis 11/23/2022   Nausea and vomiting 10/15/2022   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   Fatigue 09/17/2022   Left hemiparesis (HCC) 09/17/2022   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HCC) 08/22/2022   GERD (gastroesophageal reflux disease) 08/21/2022   Agitation 08/20/2022   Acute left-sided weakness 08/20/2022   Expressive aphasia    Stroke (HCC) 08/19/2022   Leukocytosis 08/19/2022   History of urticaria 04/28/2022   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    Primary osteoarthritis of left knee 02/24/2022   Primary osteoarthritis of right knee 02/24/2022   Lumbar spondylolysis 04/12/2020   History of CVA (cerebrovascular accident) 03/26/2020   Low back pain radiating to right lower extremity 03/21/2020   B12 deficiency 03/06/2020   Positive anti-CCP test 12/21/2019   Arthralgia 12/13/2019   Dermatitis 12/13/2019   Rheumatoid factor positive 12/13/2019   Essential hypertension 12/11/2018   Palpitations 12/11/2018   Acquired hypothyroidism 11/10/2018   Arthritis of knee 09/17/2016   Anxiety 11/22/2014   Asthma without status asthmaticus 11/22/2014   Benign neoplasm of colon, unspecified 11/22/2014   Environmental allergies 11/22/2014   Hypertriglyceridemia 11/22/2014   Hypokalemia 11/22/2014   Personal history of disease of skin and subcutaneous tissue 11/22/2014    ONSET DATE: 11/23/22  REFERRING DIAG: TIA  THERAPY DIAG:  Muscle weakness (generalized)  Difficulty in walking, not elsewhere classified  Unsteadiness on feet  Other lack of coordination  Acute left-sided weakness  Rationale for Evaluation and Treatment: Rehabilitation  SUBJECTIVE:                                                                                                                                                                                             SUBJECTIVE STATEMENT: Pt reports to PT treatment ready to participate. Noted to have significantly improved arousal, and even verbose at start of session. Pt reports that with knee brace, she is walking to and from the bathroom occasionally but states that she gets extremely dizzy/nauseous with gait to and from bathroom.   Pt accompanied by: significant other  PERTINENT HISTORY: Patient is an 82 year-old female who was admitted to [facility] on 11/23/2022 with a cerebellar CVA. 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 Pt. Was admitted  to Inpatient Rehab from  12/21-1/02/2023. 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  Patient underwent inpatient rehabilitation for 2 weeks.  Patient was assessed and was scheduled for a knee replacement on 08/29/2022 however had to cancel it due to having had a CVA.   PMH includes: anemia, arthritis, back pain, depression, hernia, GERD, HLD, HTN, hypothyroidism, knee pain, pancreatic hormone dysfunction, pneumonia, seizures, stroke, thyroid disease.  Patient has a donjoy brace for L knee. Walking with hand hold help with husband.   PAIN:  Are you having pain? Yes: NPRS scale: 0/10 Pain location: L knee Pain description: aching, gives out Aggravating factors: climbing into pain, moving it, walking around house Relieving factors: rest   PRECAUTIONS: Fall, has latex allergy  WEIGHT BEARING RESTRICTIONS: No  FALLS: Has patient fallen in last 6 months? Yes. Number of falls 4 falls   LIVING ENVIRONMENT: Lives with: lives with their family and lives with their spouse Lives in: House/apartment Stairs: Yes: Internal: flight steps;   and External: 2 steps; on right going up and on left going up Has following equipment at home: Single point cane, Walker - 2 wheeled, Shower bench, and bed side commode  PLOF: Independent  PATIENT GOALS: to have less pain and move better  OBJECTIVE:   DIAGNOSTIC FINDINGS:  Disc levels:   C2-3: No significant stenosis.   C3-4: A rightward disc osteophyte complex effaces the ventral CSF. Severe left and moderate right foraminal stenosis is present.   C4-5: A broad-based disc osteophyte complex effaces the ventral CSF. The canal is narrowed 7 mm. Severe left and moderate right foraminal stenosis is present.   C5-6: Chronic loss of disc height is present. Broad-based disc osteophyte complex is asymmetric to the right. Partial effacement of the ventral CSF is present. Moderate foraminal stenosis is worse right than left.   C6-7: A  broad-based disc osteophyte complex is present. This partially effaces the ventral CSF. Moderate foraminal narrowing is worse right than left.   C7-T1: Uncovertebral spurring is present bilaterally. Mild bilateral foraminal stenosis is present.   IMPRESSION: 1. Multilevel spondylosis of the cervical spine as described. 2. 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 3. 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 4. No other significant cord signal abnormality to suggest ischemic changes to the cord. 5. Severe left and moderate right foraminal stenosis at C3-4 and C4-5. 6. Moderate foraminal stenosis bilaterally at C5-6 and C6-7 is worse on the right. 7. Mild bilateral foraminal narrowing at C7-T1. 8. Prominent soft tissue pannus at C1-2 effaces the ventral CSF. 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  IMPRESSION: 1. 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 2. 9 mm right occipital pole infarct. 3. Punctate white matter infarct in the left occipital lobe. 4. Additional punctate cortical infarct in the medial right occipital lobe more superiorly. 5. Expected evolution of previous right posterior frontal lobe white matter infarct. 6. Stable atrophy and white matter disease likely reflects the sequela of chronic microvascular ischemia.  COGNITION: Overall cognitive status:  two strokes   SENSATION: WFL  COORDINATION: Heel slide test limited by LLE pain  POSTURE: rounded shoulders, forward head, and flexed trunk   LOWER EXTREMITY ROM:     Bowing of L knee  LOWER EXTREMITY MMT:    MMT Right Eval Left Eval R 7/16 L 7/16  Hip  flexion 3 3* 3+ 3+  Hip extension      Hip abduction 3 3* 4- 4-  Hip adduction 3 * 4- 4-  Hip internal rotation      Hip external rotation      Knee flexion 3 3* 4-  4-  Knee extension 3 3* 4- 4-  Ankle dorsiflexion 4 4 4 4   Ankle plantarflexion 4 4 4 4   (Blank rows = not tested) * pain  BED MOBILITY:  Per patient she has difficulty, occasionally needs help  TRANSFERS: Assistive device utilized: None  Sit to stand: CGA and Min A Stand to sit: Min A Chair to chair: CGA    GAIT: Gait pattern: step to pattern, decreased stance time- Left, genu recurvatum- Left, and shuffling Distance walked: 10 ft Assistive device utilized: None Level of assistance: CGA Comments: Patient is very painful with ambulation, unable to ambulate longer duration   FUNCTIONAL TESTS:  5 times sit to stand: unable to tolerate 10 meter walk test: unable to tolerate  PATIENT SURVEYS:  FOTO 47  TODAY'S TREATMENT:                                                                                                                              DATE: 08/06/23    TA: Sit<>stand from WC with CGA  assist. No reported orthostatic s/s initially   Gait with RW x 70ft with CGA. Mild pain reported in anterior tibia at site of brace strap. When returned to [facility], pt reporting severe nausea and dizziness, requesting to lay in bed. Pt performed stand pivot transfer to bed with CGA and no AD. Sit>supine with min assist for LLE management.   Orthostatic BP taken Supine 134/64 HR 72 after 3 min s/s resolve Sitting 117/73 HR 78 no reported s/s Standing 103/65 HR 76 severe orthostatic s/s  Sitting 124/72 HR 79  Standing for 2 min 102/63 HR 80 severe s/s requiring return to supine for 2 min for symptoms to resolve.  Supine: 145/79 HR 71  Ambulatory transfer to [facility] with RW x 78ft. Only mild orthostatic s/s.   Education to husband for habituation to improve BP response to standing. Encouraged short bouts of standing at EOB where pt remains asypmtomatic  4-5 bouts throughotu day. Encouraged to start at 30sec and adjust to 20-40 depending on s/s.     PATIENT EDUCATION: Education details: Pt  educated throughout session about proper posture and technique with exercises. Improved exercise technique, movement at target joints, use of target muscles after min to mod verbal, visual, tactile cues.   Importance of HEP. Continued education on  importance of nutrition and hydration prior to PT treatment to prevent orthostatic hypotension from low fluid volumes.   Possible D/C in favor of HHPT if lethargy continues.   Person educated: Patient and Spouse Education method: Explanation, Demonstration, Tactile cues, and Verbal cues Education comprehension: verbalized understanding, returned demonstration, verbal cues required, and tactile cues required  HOME EXERCISE PROGRAM:   07/07/23: Access Code: [email or web address] URL: [email or web address] Date: 07/07/2023 Prepared by: Temple Pacini  Exercises - Supine Bridge  - 1 x daily - 3-6 x weekly - 2 sets - 5-10 reps - Supine Heel Slide  - 1 x daily - 3-6 x weekly - 2 sets - 5-10 reps - Supine March  - 1 x daily - 3-6 x weekly - 2 sets - 5-10 reps  Access Code: [email or web address] URL: [email or web address] Date: 06/23/2023 Prepared by: Grier Rocher  Exercises - Standing March with Counter Support  - 1 x daily - 7 x weekly - 2 sets - 10 reps - Standing Hip Abduction with Unilateral Counter Support  - 1 x daily - 7 x weekly - 2 sets - 8 reps  Access Code: [email or web address] URL: [email or web address] Date: 04/14/2023 Prepared by: Precious Bard  Exercises - Seated Single Leg Hip Abduction  - 1 x daily - 7 x weekly - 2 sets - 10 reps - 5 hold - Seated Heel Slide  - 1 x daily - 7 x weekly - 2 sets - 10 reps - 5 hold - Seated Heel Toe Raises  - 1 x daily - 7 x weekly - 2 sets - 10 reps - 5 hold  GOALS: Goals reviewed with patient? Yes  SHORT TERM GOALS: Target date: 08/18/2023      Patient will be independent in home exercise program to improve strength/mobility for better functional independence with ADLs. Baseline:  5/7; 07/07/23: not performing HEP, provided education on importance of HEP to make gains Goal status: IN PROGRESS    LONG TERM GOALS: Target date: 09/29/2023     Patient will increase FOTO score to equal to or greater than   57%  to demonstrate statistically significant improvement in mobility and quality of life.  Baseline:  47%  7/16: 60%  Goal status: MET at assessment 7/16  2.  Patient (> 17 years old) will complete five times sit to stand test in < 15 seconds indicating an increased LE strength and improved balance. Baseline: 5/7: unable to tolerate test  7/16: 17.4sec  Goal status: IN PROGRESS  3.   Patient will deny any falls over past 4 weeks to demonstrate improved safety awareness at home and work.  Baseline: 5/7: multiple falls   1 fall from bed last week. ; 07/07/23: no falls  Goal status: MET  4.  Patient will increase BLE gross strength to 4+/5 as to improve functional strength for independent gait, increased standing tolerance and increased ADL ability Baseline: 5/7: see above  7/16: improved to grossly 4-/5 overall  Goal status: INITIAL  5.  Patient will be able to perform household work/ chores without increase in symptoms. Baseline: 5/7: unable to perform   7/16: Pt reports that she has returned to doing some housework again, but reports that she is performing from [facility]. ; 07/07/23: pt completing basic ADLs, but otherwise not doing household chores  Goal status: IN PROGRESS  6. Pt will increase gait speed to >0.8 m/s to indicate reduced fall risk and improved safety with house hold level ambulation.  Baseline: 7/16: 0.441m/s   Goal status: New      ASSESSMENT:  CLINICAL IMPRESSION:  Pt presented to PT ready to participate. Noted to be verbose intermittently at start of session. Continues to be limited for short bouts in standing due to orthostatic hypotension. Education to husband on habituation to standing for short bouts to address cardiac response to  position change. Due  to improved arousal, At this time, The patient will benefit from skilled physical therapy to reduce pain, improve mobility, improve strength and improve quality of life.     OBJECTIVE IMPAIRMENTS: Abnormal gait, cardiopulmonary status limiting activity, decreased activity tolerance, decreased balance, decreased cognition, decreased endurance, decreased knowledge of use of DME, decreased mobility, difficulty walking, decreased ROM, decreased strength, decreased safety awareness, hypomobility, increased fascial restrictions, impaired perceived functional ability, impaired flexibility, impaired UE functional use, improper body mechanics, postural dysfunction, and pain.   ACTIVITY LIMITATIONS: carrying, lifting, bending, sitting, standing, squatting, stairs, transfers, bed mobility, bathing, toileting, dressing, reach over head, hygiene/grooming, locomotion level, and caring for others  PARTICIPATION LIMITATIONS: meal prep, cleaning, laundry, medication management, personal finances, interpersonal relationship, driving, shopping, community activity, yard work, school, and church  PERSONAL FACTORS: Age, Behavior pattern, Education, Fitness, Past/current experiences, Time since onset of injury/illness/exacerbation, and 3+ comorbidities: anemia, arthritis, back pain, depression, hernia, GERD, HLD, HTN, hypothyroidism, knee pain, pancreatic hormone dysfunction, pneumonia, seizures, stroke, thyroid disease  are also affecting patient's functional outcome.   REHAB POTENTIAL: Fair    CLINICAL DECISION MAKING: Evolving/moderate complexity  EVALUATION COMPLEXITY: Moderate  PLAN:  PT FREQUENCY: 1-2x/week  PT DURATION: 12 weeks  PLANNED INTERVENTIONS: Therapeutic exercises, Therapeutic activity, Neuromuscular re-education, Balance training, Gait training, Patient/Family education, Self Care, Joint mobilization, Joint manipulation, Stair training, Vestibular training, Canalith  repositioning, Visual/preceptual remediation/compensation, Orthotic/Fit training, DME instructions, Electrical stimulation, Wheelchair mobility training, Spinal mobilization, Cryotherapy, Moist heat, Compression bandaging, scar mobilization, Splintting, Taping, Traction, Ultrasound, Manual therapy, and Re-evaluation  PLAN FOR NEXT SESSION:  Standing tolerance and BLE strength training.  Golden Pop, PT 08/06/2023, 4:20 PM

## 2023-08-07 NOTE — Therapy (Signed)
Occupational Therapy Treatment Note  Patient Name: [name] MRN: [id number] DOB:10-26-1941, 82 y.o., female Today's Date: 08/07/2023  PCP: Dr. Judithann Sheen REFERRING PROVIDER:  Dr. Judithann Sheen  END OF SESSION:  PLAN:   OT End of Session - 08/07/23 2241     Visit Number 54    Number of Visits 72    Date for OT Re-Evaluation 08/20/23    OT Start Time 1532    OT Stop Time 1615    OT Time Calculation (min) 43 min    Equipment Utilized During Treatment Wheelchair    Activity Tolerance Patient tolerated treatment well    Behavior During Therapy WFL for tasks assessed/performed               Past Medical History:  Diagnosis Date   Anemia    Arthritis    Asthma    uses inhaler just prior to surgery to avoid attack   Back pain    from previous injury   Complication of anesthesia    has woken  up during 2 different surgery   Depression    no current issue/treatment; situation   Gallstones    GERD (gastroesophageal reflux disease)    Hiatal hernia    patient does NOT have nerve/muscle disease   History of kidney stones    HLD (hyperlipidemia)    HTN (hypertension)    Hypothyroidism    Kidney stones    Knee pain    Nausea and vomiting 10/15/2022   Non-diabetic pancreatic hormone dysfunction years   pt. 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   Pancreatitis    Pneumonia    Seizures (HCC)    caused by dye injected during a procedure   Shortness of breath    with exertion   Sinus problem    frequent infections/congestion   Stroke (HCC) 2021   reports having CVA in 2021 and having mini strokes before that   Thyroid disease    Past Surgical History:  Procedure Laterality Date   ABDOMINAL HYSTERECTOMY     APPENDECTOMY     CARPAL TUNNEL RELEASE  10+ years ago   bilateral   EYE SURGERY  3 yrs ago   bilateral cataracts   FOOT OSTEOTOMY  6 weeks ago   Left foot: great, 2nd & 3rd   FOOT OSTEOTOMY  5 years ago   Right great toe   HAND SURGERY Bilateral 2011-most  recent   multiple hand surgeries, 2 on left, 3 on right   KNEE ARTHROPLASTY Right 04/28/2022   Procedure: COMPUTER ASSISTED TOTAL KNEE ARTHROPLASTY;  Surgeon: Donato Heinz, MD;  Location: [facility];  Service: Orthopedics;  Laterality: Right;   LOOP RECORDER INSERTION N/A 05/16/2020   Procedure: LOOP RECORDER INSERTION;  Surgeon: Marcina Millard, MD;  Location: [facility];  Service: Cardiovascular;  Laterality: N/A;   NASAL SINUS SURGERY  most recent 7-8 yrs ago   7 sinus surgeries    TRIGGER FINGER RELEASE  11/19/2011   Procedure: RELEASE TRIGGER FINGER/A-1 PULLEY;  Surgeon: Nicki Reaper, MD;  Location: [facility];  Service: Orthopedics;  Laterality: Right;  release a-1 pulley right index finger and cyst removal   WRIST GANGLION EXCISION  1980's   right   Patient Active Problem List   Diagnosis Date Noted   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 11/27/2022   Hypomagnesemia 11/27/2022   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   HLD (hyperlipidemia) 11/23/2022   Asthma 11/23/2022   Depression  with anxiety 11/23/2022   Chronic diastolic CHF (congestive heart failure) (HCC) 11/23/2022   Normocytic anemia 11/23/2022   Aspiration pneumonia (HCC) 11/23/2022   AKI (acute kidney injury) (HCC) 11/23/2022   Abdominal pain 11/23/2022   Mesenteric mass 11/23/2022   Coffee ground emesis 11/23/2022   Nausea and vomiting 10/15/2022   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   Fatigue 09/17/2022   Left hemiparesis (HCC) 09/17/2022   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HCC) 08/22/2022   GERD (gastroesophageal reflux disease) 08/21/2022   Agitation 08/20/2022   Acute left-sided weakness 08/20/2022   Expressive aphasia    Stroke (HCC) 08/19/2022   Leukocytosis 08/19/2022   History of urticaria 04/28/2022   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   Primary osteoarthritis of left knee 02/24/2022   Primary osteoarthritis of right knee 02/24/2022   Lumbar  spondylolysis 04/12/2020   History of CVA (cerebrovascular accident) 03/26/2020   Low back pain radiating to right lower extremity 03/21/2020   B12 deficiency 03/06/2020   Positive anti-CCP test 12/21/2019   Arthralgia 12/13/2019   Dermatitis 12/13/2019   Rheumatoid factor positive 12/13/2019   Essential hypertension 12/11/2018   Palpitations 12/11/2018   Acquired hypothyroidism 11/10/2018   Arthritis of knee 09/17/2016   Anxiety 11/22/2014   Asthma without status asthmaticus 11/22/2014   Benign neoplasm of colon, unspecified 11/22/2014   Environmental allergies 11/22/2014   Hypertriglyceridemia 11/22/2014   Hypokalemia 11/22/2014   Personal history of disease of skin and subcutaneous tissue 11/22/2014   REFERRING DIAG: CVA   THERAPY DIAG:  Muscle weakness (generalized)   Other lack of coordination   Rationale for Evaluation and Treatment Rehabilitation   SUBJECTIVE:   SUBJECTIVE STATEMENT: Pt reports doing well today.  Pt accompanied by: significant other   PERTINENT HISTORY: Patient is an 82 year-old female who was admitted to [facility] on 11/23/2022 with a cerebellar CVA. 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 Pt. Was admitted to Inpatient Rehab from 12/21-1/02/2023. 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  Patient underwent inpatient rehabilitation for 2 weeks.  Patient was assessed and was scheduled for a knee replacement on 08/29/2022 however had to cancel it due to having had a CVA.  Patient had a recent fall 2 days after discharging from inpatient rehab. Past Medical History includes: Knee replacement, essential HTN, hypokalemia, leukocytosis, seizures, positive anti-- CCP test, anxiety disorder, mini strokes.  Patient had shingles with left eye nerve pain s/p 1 year ago.    PRECAUTIONS: Fall   WEIGHT BEARING RESTRICTIONS No   PAIN:  Are you having pain? 8/10 Left shoulder pain,  and stiffness initially.   FALLS: Has patient fallen in last 6 months? Yes. Number of falls 1   LIVING ENVIRONMENT: Lives with: Lives with Spouse Lives in: House/apartment Stairs: 2 storey home, resides on the first floor.  External: 2 stairs front no rails, and 6 in back with rails Has following equipment at home: Single point cane, Walker - 2 wheeled, Environmental consultant - 4 wheeled, Shower bench, and bed side commode   PLOF: Independent   PATIENT GOALS  To Regain the use of her left arm   OBJECTIVE:    HAND DOMINANCE: Right   ADLs: Overall ADLs: Husband assists pt. as needed Transfers/ambulation related to ADLs:Pt. Uses a 3 wheeled walker with Husband assist. Eating: Pt. Is independent with the right hand. Pt. has difficulty cutting food. Grooming: Pt. is using her right hand, however has difficulty sustaining her LUE in elevation to assist  with haircare. UB Dressing: Pt. Is independent donning a pullover shirt, and button down shirt. Has difficulty with buttoning, LB Dressing:  Independent donning pants, and socks. Difficulty tying shoes. Toileting: Independent Bathing: Pt. Is able to engage her right hand. Tub Shower transfers: Supervision Equipment: See above for equipment    IADLs: Shopping:  Has not had the opportunity for grocery shopping yet Light housekeeping: Husband is assisting with light house keeping Meal Prep:  Dependent Community mobility: Relies of family/friends Medication management: Husband assisting with weekly pillbox set-up, and administering medication. Financial management: TBD Handwriting: 75% legible   MOBILITY STATUS: Hx of falls   POSTURE COMMENTS:  No Significant postural limitations Sitting balance: supported sitting balance WFL   ACTIVITY TOLERANCE: Activity tolerance:  Fatigues in greater than 30 min.    FUNCTIONAL OUTCOME MEASURES: FOTO: 57   UPPER EXTREMITY ROM      Active ROM Right Eval: WFL Left eval Left  01/22/23 Left  03/03/23 Left   04/14/23 Left  05/28/2023 Left 07/23/2023  Shoulder flexion   132 100 108 108 108 60(70)  Shoulder abduction   80 85 85 85 85 65(78)  Shoulder adduction           Shoulder extension           Shoulder internal rotation           Shoulder external rotation           Elbow flexion   140 140 [facility] WFL [facility] WFL  Elbow extension   WNL WNL [facility] WFL WFL  Wrist flexion   65 68      Wrist extension   -10 20 24  32 32 -10  Wrist ulnar deviation     12 10 14 14 10   Wrist radial deviation     8 14 14 16 12   Wrist pronation           Wrist supination           (Blank rows = not tested)   Left digit flexion to [facility]: 2nd: 0cm, 3rd: 0cm, 4th: 0cm, 5th: 0cm   Limited Left full 2nd digit extension     UPPER EXTREMITY MMT:      MMT Right Eval: 4+/5 overall Right 07/23/2023 Left Eval Left 01/22/23 Left 03/03/23 Left  04/14/2023 Left 05/28/2023 Left 07/23/2023  Shoulder flexion   3+/5 3/5 3-/5 3-/5 3-/5 3-/5 2/5  Shoulder abduction   3+/5 3-/5 3-/5 3-/5 3-/5 3-/5 2/5  Shoulder adduction            Shoulder extension            Shoulder internal rotation            Shoulder external rotation            Middle trapezius            Lower trapezius            Elbow flexion   4/5 3+/5 4/5 N/A 4/5 4/5 3+/5  Elbow extension   4/5 3+/5 4/4 N/A 4/5 4/5 3+/5  Wrist flexion            Wrist extension   4/5 2-/5 3-/5 3-/5 3/5 3+/5 3+/5  Wrist ulnar deviation            Wrist radial deviation            Wrist pronation            Wrist supination            (  Blank rows = not tested)   HAND FUNCTION: Grip strength: Right: 26#, Left: 10# Pinch strength: Right 8#, Left: 3#, 3 Pt. Pinch strength: Right: 9#, L: 2#  01/22/2023 Grip strength: Right: 26#, Left: 12# Pinch strength:  Pinch meter used at the initial eval has been sent out for recalibration   03/03/2023 Grip strength: Right: 26#, Left: 13# Pinch strength:  Pinch meter used at the initial eval has been sent out for  recalibration  04/14/2023: Grip strength: Right: 26#, Left: 13# Pinch strength:   Right 8#, Left: 4#, 3 Pt. Pinch strength: Right: 9#, L: 3#    05/28/2023: Grip strength: Right: 26#, Left: 13# Pinch strength:   Right 8#, Left: 2#, 3 Pt. Pinch strength: Right: 9#, L: 4#  07/23/2023  Grip strength: Right: 5#, Left: 2# Pinch strength:   Right 6#, Left: 2#, 3 Pt. Pinch strength: Right: 5#, L: 2#    COORDINATION: Right: 22 sec., Left: <5 min. To place 7 pegs with increased compensation proximally in the trunk, and through reflexive associated reactions.  01/22/23 Right: 22 sec., Left: 3 min. & 4 sec.  03/03/23 Right: 22 sec., Left: 1 min. & 39 sec.  04/14/23   TBD  05/28/23 Right: 22 sec., Left:  5 min.  07/23/2023  Right: 33 sec., Left:  Pt. Is unable to grasp, and place pegs into the pegboard. Pt. Was able to remove 9 vertical pegs in 34 sec.    SENSATION: Light touch: WFL, proprioceptive awareness: Intact   EDEMA: N/A   MUSCLE TONE: LUE: Hypotonic   COGNITION: Overall cognitive status: WFL for tasks assessed. Pt. Is impulsive at times.   VISION: Subjective report: Pt. report having shingles affecting left eye  s/p 1 year. Has nerve pain Baseline vision: Wears glasses for reading only Visual history:  updated see clinical impression   VISION ASSESSMENT:    WFL for tasks performed   PERCEPTION: Intact   PRAXIS: Impaired: Motor planning   OBSERVATIONS:  Pt. more alert, and engaging since prior to the most recent hospitalization.   TODAY'S TREATMENT:   Therapeutic Exercise:  Moist heat applied to L shoulder intermittently throughout session and simultaneous to exercise to promote muscle relaxation and manage pain. Pt. tolerated gentle passive and active assists L scapular mobility all planes.  Completed table slides for L shoulder mobility placing hand on pillow case for increased gliding, and used pen for pt to push across the table top to promote gliding towards end  range for L shoulder forward flex, horiz abd/add, elbow flex/ext, and L shoulder IR/ER with min guard-min A to maximize tolerable end range for 10 reps each.  Pt completed reps of active and active assisted L hand digit abd/add, L thumb only radial abd and add against min resistance, and reps of place and hold for L 2nd digit ext.  Alternated reps of passive wrist and digit extension during rest breaks from therapeutic activity noted below in order to reduce flexor tone.    Therapeutic Activity: Pt practiced reps of 3, 4, and 5 fingered grasp patterns using the L hand to remove cubed velcro blocks from checker board.  Then practiced moving blocks 1 by 1 from table top back up to pegboard.        PATIENT EDUCATION: Education details: LUE therapeutic exercise Person educated: Patient and Spouse Education method: Medical illustrator Education comprehension: verbalized understanding, returned demonstration, and needs further education   HOME EXERCISE PROGRAM:   Reviewed activities at home to promote isolated  2nd digit extension.    GOALS: Goals reviewed with patient? Yes   SHORT TERM GOALS: Target date:  07/09/2023    1. Patient will be independent with home exercise program for the left upper extremity Baseline: 07/23/2023: Pt. continues to require assist. 05/28/2023: Pt. Requires assist 04/09/2023: Pt. Continues to consistently attempt to engage her hand at hand 02/01/2023: Pt. Consistently attempts to perform HEPs independently. No current home exercise program Goal status: Ongoing   LONG TERM GOALS: Target date:  08/20/2023   1.  Patient will improve left shoulder strength by 2 mm grades to be able to sustain UE's in elevation long enough to wash her hair.  Baseline: 07/23/2023: Left shoulder flexion: 2/5, abduction: 2/5. 05/28/2023: Left shoulder flexion: 3-/5, abduction: 3-/5. Pt. has an old left shoulder injury limiting progression with left shoulder strength 05/26/2023: Pt. continues to  present with limitiations in sustaining LUE elevation long enough to perform hair care thoroughly 04/09/2023: Pt. is limited with sustaining LUE elevation long enough to perform hair care thoroughly. 03/03/2023: Left shoulder flexion: 3-/5, abduction: 3-/5 01/22/2023: Left shoulder flexion: 3-/5, abduction: 3-/5 Eval: Left shoulder flexion: 3/5, abduction: 3-/5 Goal status: Revised, to continue   2.  Patient will improve left shoulder active abduction to be able to comb her hair Baseline: 07/23/2023: Left shoulder flexion: 60(70) abduction: 65(78) 05/28/2023: left shoulder flexion: 108 abduction: 85 Pt. Has an old left shoulder injury limiting progression with left shoulder ROM 05/26/2023:  5/10 left shoulder pain with ROM limits using it functionally during hair care. 04/09/2023: Pt. Presents with difficulty abducting her left shoulder enough to thoroughly complete haircare. 03/03/2023: Shoulder abduction: 85 01/22/2023: Shoulder abduction: 85 Eval: Left shoulder abduction is 80(108) Goal status: Revised, to continue   3.  Patient will independently button her shirt with modified independence. Baseline: 07/23/2023 Pt. presents with difficulty buttoning her shirt. 05/28/2023: pt. Continues to work towards progressing with buttoning 05/26/2023:  Pt. Continues to progress towards buttoning. 04/09/2023: Pt. Continues to progress towards buttoning. 03/03/2023: Pt. Continues to have difficulty with buttoning. 01/22/2023: Pt. continues to have difficulty. Eval: Patient has difficulty.  Goal status: Ongoing   4.  Patient well improve left grip strength in preparation for securely holding flowers. Baseline: 07/23/2023: Grip strength: Right: 5#, Left: 2# 05/28/2023: Right: 26#, Left: 13#  pt. Presents with difficulty securely holding flowers in her left hand. 05/26/2023: TBD 04/09/2023: Pt. Is able to hold, and hike pants with the left hand, continues to have difficulty with securely holding flowers.   03/03/2023: left grip  strength: 13# 01/22/2023: Left: 12# Eval: Pt. Is unable to securely hold flowers. Goal status: Ongoing   5.  Pt. will independently recall adaptive  strategies for performing ADL tasks including: flossing teeth, donning bra, applying makeup. Baseline: 07/23/2023: Continue 05/28/2023: Pt. Needs continued education about adaptive strategies.05/26/2023: Continue 04/09/2023: Continue3/26/2024: Continue 01/22/2023: Pt. continues to benefit from education about adaptive strategies during ADLs, and IADLs. Eval: Pt. to be provided with adaptive strategies. Goal status: Ongoing   6.  Pt. will improve FOTO score by 2 points to reflect patient perceived performance improvement assessment specific ADLs  and IADLs Baseline: 03/09/271: FOTO score: 57 05/28/2023: FOTO score 59 05/26/2023: TBD 5/02/204:  TBD 03/03/23: FOTO 62 Eval: 57 Goal status: Ongoing  7.  Pt. will improve left hand coordination skills in order to be able to handle, and sort utensils in a drawer.     Baseline: 07/23/2023: Right: 33 sec., Left:  Pt. Is unable to grasp, and place  pegs into the pegboard. Pt. Was able to remove 9 vertical pegs in 34 sec. 05/28/2023: Left:  5 min. 05/26/2023 Pt. Continues to present with difficulty handling and sorting utensils. 04/14/2023: 56 04/09/2023: TBD 03/03/23: 1 min. & 39 01/22/2022: Left: 3 min. & 4 sec. Eval: Pt. has difficulty sorting, and placing utensils with the left hand. Left FMC : >5 min. For 7 pegs on the 9 hole peg test.    Goal Status:  Ongoing  8. Pt. will improve active left 2nd digit extension to be able able to isolate her 2nd digit in preparation for pressing/pushing buttons on appliances, phones, or remotes. Baseline: 07/23/2023: Pt. Continues to work on consistency with isolating left 2nd digit extension 05/28/2023: Pt. Continues to improve with left 2nd digit extension 05/26/2023: Improving with left 2nd digit extension. 04/09/2023: Pt. Is progressing with isolating left 2nd digit extension, however  continues to present with limited increased flexor tone. 03/03/23: Pt. Continues to work on improving consistency with 2nd digit extension to press the remote. 01/22/2023: is able to perform full digit extension, although 2nd digit is slow to extend s 2/2 flexor tone. Pt.  Eval: Pt. is able to is unable to actively perform full digit extension Goal status:  Ongoing   ASSESSMENT:   CLINICAL IMPRESSION: Pt continues to present with high pain levels in the left shoulder, and stiffness initially, however after treatment pt reports increased mobility, less stiffiness, and less pain. Pt tolerated table slides for LUE well in above noted planes, and responded well to use of pen to push across table to target increased end range of motion and a steady glide throughout each plane of movement.  Pt used a 3, 4, and 5 fingered grasp pattern to move velcro cubes on/off a block, but did require frequent rest breaks d/t LUE fatigue, and reps of passive wrist and digit extension stretching to minimize flexor tone in the L hand, specifically L IF flexion.  Pt. continues to benefit from skilled OT services to work on improving BUE strength, and coordination skills, as well as left shoulder, elbow, and wrist strength, improving motor control and coordination in order to prepare the left upper extremity and hand for functional reaching and engaging the upper left extremity when performing hair care, buttoning, holding flowers, cutting food, and manipulating utensils to set the table as well as adaptive strategies during daily IADL care.     PERFORMANCE DEFICITS in functional skills including ADLs, IADLs, coordination, proprioception, ROM, strength, FMC, and GMC, cognitive skills including memory, and psychosocial skills including coping strategies, environmental adaptation, interpersonal interactions, and routines and behaviors.    IMPAIRMENTS are limiting patient from ADLs, IADLs, education, leisure, and social participation.     COMORBIDITIES may have co-morbidities  that affects occupational performance. Patient will benefit from skilled OT to address above impairments and improve overall function.   MODIFICATION OR ASSISTANCE TO COMPLETE EVALUATION: Min-Moderate modification of tasks or assist with assess necessary to complete an evaluation.   OT OCCUPATIONAL PROFILE AND HISTORY: Detailed assessment: Review of records and additional review of physical, cognitive, psychosocial history related to current functional performance.   CLINICAL DECISION MAKING: Moderate - several treatment options, min-mod task modification necessary   REHAB POTENTIAL: Good   EVALUATION COMPLEXITY: Moderate      PLAN: OT FREQUENCY: 2x/week   OT DURATION: 12 weeks   PLANNED INTERVENTIONS: self care/ADL training, therapeutic exercise, therapeutic activity, neuromuscular re-education, manual therapy, passive range of motion, functional mobility training, electrical stimulation, and paraffin  RECOMMENDED OTHER SERVICES: PT   CONSULTED AND AGREED WITH PLAN OF CARE: Patient and family member/caregiver   PLAN FOR NEXT SESSION: see above   Danelle Earthly, MS, OTR/L

## 2023-08-09 ENCOUNTER — Other Ambulatory Visit: Payer: Self-pay | Admitting: Physical Medicine and Rehabilitation

## 2023-08-11 ENCOUNTER — Ambulatory Visit: Payer: Medicare HMO | Attending: Physical Medicine and Rehabilitation | Admitting: Occupational Therapy

## 2023-08-11 DIAGNOSIS — M6281 Muscle weakness (generalized): Secondary | ICD-10-CM | POA: Insufficient documentation

## 2023-08-11 DIAGNOSIS — R2681 Unsteadiness on feet: Secondary | ICD-10-CM | POA: Insufficient documentation

## 2023-08-11 DIAGNOSIS — R278 Other lack of coordination: Secondary | ICD-10-CM | POA: Insufficient documentation

## 2023-08-11 DIAGNOSIS — R531 Weakness: Secondary | ICD-10-CM | POA: Insufficient documentation

## 2023-08-11 DIAGNOSIS — R262 Difficulty in walking, not elsewhere classified: Secondary | ICD-10-CM | POA: Diagnosis not present

## 2023-08-11 NOTE — Therapy (Signed)
Occupational Therapy Treatment Note  Patient Name: [name] MRN: [id number] DOB:[birth year], 82 y.o., female Today's Date: 08/11/2023  PCP: Dr. Judithann Sheen REFERRING PROVIDER:  Dr. Judithann Sheen  END OF SESSION:  PLAN:   OT End of Session - 08/11/23 1644     Visit Number 55    Number of Visits 72    Date for OT Re-Evaluation 08/20/23    Authorization Time Period Progress report period starting 12/16/2022    OT Start Time 1530    OT Stop Time 1615    OT Time Calculation (min) 45 min    Activity Tolerance Patient tolerated treatment well    Behavior During Therapy WFL for tasks assessed/performed               Past Medical History:  Diagnosis Date   Anemia    Arthritis    Asthma    uses inhaler just prior to surgery to avoid attack   Back pain    from previous injury   Complication of anesthesia    has woken  up during 2 different surgery   Depression    no current issue/treatment; situation   Gallstones    GERD (gastroesophageal reflux disease)    Hiatal hernia    patient does NOT have nerve/muscle disease   History of kidney stones    HLD (hyperlipidemia)    HTN (hypertension)    Hypothyroidism    Kidney stones    Knee pain    Nausea and vomiting 10/15/2022   Non-diabetic pancreatic hormone dysfunction years   pt. 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   Pancreatitis    Pneumonia    Seizures (HCC)    caused by dye injected during a procedure   Shortness of breath    with exertion   Sinus problem    frequent infections/congestion   Stroke (HCC) 2021   reports having CVA in 2021 and having mini strokes before that   Thyroid disease    Past Surgical History:  Procedure Laterality Date   ABDOMINAL HYSTERECTOMY     APPENDECTOMY     CARPAL TUNNEL RELEASE  10+ years ago   bilateral   EYE SURGERY  3 yrs ago   bilateral cataracts   FOOT OSTEOTOMY  6 weeks ago   Left foot: great, 2nd & 3rd   FOOT OSTEOTOMY  5 years ago   Right great toe   HAND SURGERY  Bilateral 2011-most recent   multiple hand surgeries, 2 on left, 3 on right   KNEE ARTHROPLASTY Right 04/28/2022   Procedure: COMPUTER ASSISTED TOTAL KNEE ARTHROPLASTY;  Surgeon: Donato Heinz, MD;  Location: [facility];  Service: Orthopedics;  Laterality: Right;   LOOP RECORDER INSERTION N/A 05/16/2020   Procedure: LOOP RECORDER INSERTION;  Surgeon: Marcina Millard, MD;  Location: [facility];  Service: Cardiovascular;  Laterality: N/A;   NASAL SINUS SURGERY  most recent 7-8 yrs ago   7 sinus surgeries    TRIGGER FINGER RELEASE  11/19/2011   Procedure: RELEASE TRIGGER FINGER/A-1 PULLEY;  Surgeon: Nicki Reaper, MD;  Location: [facility];  Service: Orthopedics;  Laterality: Right;  release a-1 pulley right index finger and cyst removal   WRIST GANGLION EXCISION  1980's   right   Patient Active Problem List   Diagnosis Date Noted   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 11/27/2022   Hypomagnesemia 11/27/2022   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   HLD (hyperlipidemia) 11/23/2022   Asthma 11/23/2022  Depression with anxiety 11/23/2022   Chronic diastolic CHF (congestive heart failure) (HCC) 11/23/2022   Normocytic anemia 11/23/2022   Aspiration pneumonia (HCC) 11/23/2022   AKI (acute kidney injury) (HCC) 11/23/2022   Abdominal pain 11/23/2022   Mesenteric mass 11/23/2022   Coffee ground emesis 11/23/2022   Nausea and vomiting 10/15/2022   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   Fatigue 09/17/2022   Left hemiparesis (HCC) 09/17/2022   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HCC) 08/22/2022   GERD (gastroesophageal reflux disease) 08/21/2022   Agitation 08/20/2022   Acute left-sided weakness 08/20/2022   Expressive aphasia    Stroke (HCC) 08/19/2022   Leukocytosis 08/19/2022   History of urticaria 04/28/2022   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   Primary osteoarthritis of left knee 02/24/2022   Primary osteoarthritis of right knee  02/24/2022   Lumbar spondylolysis 04/12/2020   History of CVA (cerebrovascular accident) 03/26/2020   Low back pain radiating to right lower extremity 03/21/2020   B12 deficiency 03/06/2020   Positive anti-CCP test 12/21/2019   Arthralgia 12/13/2019   Dermatitis 12/13/2019   Rheumatoid factor positive 12/13/2019   Essential hypertension 12/11/2018   Palpitations 12/11/2018   Acquired hypothyroidism 11/10/2018   Arthritis of knee 09/17/2016   Anxiety 11/22/2014   Asthma without status asthmaticus 11/22/2014   Benign neoplasm of colon, unspecified 11/22/2014   Environmental allergies 11/22/2014   Hypertriglyceridemia 11/22/2014   Hypokalemia 11/22/2014   Personal history of disease of skin and subcutaneous tissue 11/22/2014   REFERRING DIAG: CVA   THERAPY DIAG:  Muscle weakness (generalized)   Other lack of coordination   Rationale for Evaluation and Treatment Rehabilitation   SUBJECTIVE:   SUBJECTIVE STATEMENT:  Pt reports 9-10/10 pain in her left arm  Pt accompanied by: significant other   PERTINENT HISTORY: Patient is an 82 year-old female who was admitted to [facility] on 11/23/2022 with a cerebellar CVA. 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 Pt. Was admitted to Inpatient Rehab from 12/21-1/02/2023. 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  Patient underwent inpatient rehabilitation for 2 weeks.  Patient was assessed and was scheduled for a knee replacement on 08/29/2022 however had to cancel it due to having had a CVA.  Patient had a recent fall 2 days after discharging from inpatient rehab. Past Medical History includes: Knee replacement, essential HTN, hypokalemia, leukocytosis, seizures, positive anti-- CCP test, anxiety disorder, mini strokes.  Patient had shingles with left eye nerve pain s/p 1 year ago.    PRECAUTIONS: Fall   WEIGHT BEARING RESTRICTIONS No   PAIN:  Are you  having pain? 8/10 Left shoulder pain, and stiffness initially.   FALLS: Has patient fallen in last 6 months? Yes. Number of falls 1   LIVING ENVIRONMENT: Lives with: Lives with Spouse Lives in: House/apartment Stairs: 2 storey home, resides on the first floor.  External: 2 stairs front no rails, and 6 in back with rails Has following equipment at home: Single point cane, Walker - 2 wheeled, Environmental consultant - 4 wheeled, Shower bench, and bed side commode   PLOF: Independent   PATIENT GOALS  To Regain the use of her left arm   OBJECTIVE:    HAND DOMINANCE: Right   ADLs: Overall ADLs: Husband assists pt. as needed Transfers/ambulation related to ADLs:Pt. Uses a 3 wheeled walker with Husband assist. Eating: Pt. Is independent with the right hand. Pt. has difficulty cutting food. Grooming: Pt. is using her right hand, however has difficulty sustaining her  LUE in elevation to assist with haircare. UB Dressing: Pt. Is independent donning a pullover shirt, and button down shirt. Has difficulty with buttoning, LB Dressing:  Independent donning pants, and socks. Difficulty tying shoes. Toileting: Independent Bathing: Pt. Is able to engage her right hand. Tub Shower transfers: Supervision Equipment: See above for equipment    IADLs: Shopping:  Has not had the opportunity for grocery shopping yet Light housekeeping: Husband is assisting with light house keeping Meal Prep:  Dependent Community mobility: Relies of family/friends Medication management: Husband assisting with weekly pillbox set-up, and administering medication. Financial management: TBD Handwriting: 75% legible   MOBILITY STATUS: Hx of falls   POSTURE COMMENTS:  No Significant postural limitations Sitting balance: supported sitting balance WFL   ACTIVITY TOLERANCE: Activity tolerance:  Fatigues in greater than 30 min.    FUNCTIONAL OUTCOME MEASURES: FOTO: 57   UPPER EXTREMITY ROM      Active ROM Right Eval: WFL  Left eval Left  01/22/23 Left  03/03/23 Left  04/14/23 Left  05/28/2023 Left 07/23/2023  Shoulder flexion   132 100 108 108 108 60(70)  Shoulder abduction   80 85 85 85 85 65(78)  Shoulder adduction           Shoulder extension           Shoulder internal rotation           Shoulder external rotation           Elbow flexion   140 140 [facility] WFL [facility] WFL  Elbow extension   WNL WNL [facility] WFL WFL  Wrist flexion   65 68      Wrist extension   -10 20 24  32 32 -10  Wrist ulnar deviation     12 10 14 14 10   Wrist radial deviation     8 14 14 16 12   Wrist pronation           Wrist supination           (Blank rows = not tested)   Left digit flexion to [facility]: 2nd: 0cm, 3rd: 0cm, 4th: 0cm, 5th: 0cm   Limited Left full 2nd digit extension     UPPER EXTREMITY MMT:      MMT Right Eval: 4+/5 overall Right 07/23/2023 Left Eval Left 01/22/23 Left 03/03/23 Left  04/14/2023 Left 05/28/2023 Left 07/23/2023  Shoulder flexion   3+/5 3/5 3-/5 3-/5 3-/5 3-/5 2/5  Shoulder abduction   3+/5 3-/5 3-/5 3-/5 3-/5 3-/5 2/5  Shoulder adduction            Shoulder extension            Shoulder internal rotation            Shoulder external rotation            Middle trapezius            Lower trapezius            Elbow flexion   4/5 3+/5 4/5 N/A 4/5 4/5 3+/5  Elbow extension   4/5 3+/5 4/4 N/A 4/5 4/5 3+/5  Wrist flexion            Wrist extension   4/5 2-/5 3-/5 3-/5 3/5 3+/5 3+/5  Wrist ulnar deviation            Wrist radial deviation            Wrist pronation  Wrist supination            (Blank rows = not tested)   HAND FUNCTION: Grip strength: Right: 26#, Left: 10# Pinch strength: Right 8#, Left: 3#, 3 Pt. Pinch strength: Right: 9#, L: 2#  01/22/2023 Grip strength: Right: 26#, Left: 12# Pinch strength:  Pinch meter used at the initial eval has been sent out for recalibration   03/03/2023 Grip strength: Right: 26#, Left: 13# Pinch strength:  Pinch meter used at the initial eval has  been sent out for recalibration  04/14/2023: Grip strength: Right: 26#, Left: 13# Pinch strength:   Right 8#, Left: 4#, 3 Pt. Pinch strength: Right: 9#, L: 3#    05/28/2023: Grip strength: Right: 26#, Left: 13# Pinch strength:   Right 8#, Left: 2#, 3 Pt. Pinch strength: Right: 9#, L: 4#  07/23/2023  Grip strength: Right: 5#, Left: 2# Pinch strength:   Right 6#, Left: 2#, 3 Pt. Pinch strength: Right: 5#, L: 2#    COORDINATION: Right: 22 sec., Left: <5 min. To place 7 pegs with increased compensation proximally in the trunk, and through reflexive associated reactions.  01/22/23 Right: 22 sec., Left: 3 min. & 4 sec.  03/03/23 Right: 22 sec., Left: 1 min. & 39 sec.  04/14/23   TBD  05/28/23 Right: 22 sec., Left:  5 min.  07/23/2023  Right: 33 sec., Left:  Pt. Is unable to grasp, and place pegs into the pegboard. Pt. Was able to remove 9 vertical pegs in 34 sec.    SENSATION: Light touch: WFL, proprioceptive awareness: Intact   EDEMA: N/A   MUSCLE TONE: LUE: Hypotonic   COGNITION: Overall cognitive status: WFL for tasks assessed. Pt. Is impulsive at times.   VISION: Subjective report: Pt. report having shingles affecting left eye  s/p 1 year. Has nerve pain Baseline vision: Wears glasses for reading only Visual history:  updated see clinical impression   VISION ASSESSMENT:    WFL for tasks performed   PERCEPTION: Intact   PRAXIS: Impaired: Motor planning   OBSERVATIONS:  Pt. more alert, and engaging since prior to the most recent hospitalization.   TODAY'S TREATMENT:    Contrast Bath:  Pt. tolerated alternating contrasting heat pack 3 min. X's cold pack 1 min. for a trial of 3 reps for a total of 12 min. 2/2 left hand/wrist pain, and swelling.  There. Ex.:  Pt. Tolerated PROM/AAROM to the Left shoulder, elbow, wrist, and digits.  Manual Therapy:  Pt. tolerated soft tissue massage to the left scapular, and shoulder regions. Manual therapy was performed  independent of, and in preparation for therapeutic Ex.            PATIENT EDUCATION: Education details: LUE therapeutic exercise Person educated: Patient and Spouse Education method: Medical illustrator Education comprehension: verbalized understanding, returned demonstration, and needs further education   HOME EXERCISE PROGRAM:   Reviewed activities at home to promote isolated 2nd digit extension.    GOALS: Goals reviewed with patient? Yes   SHORT TERM GOALS: Target date:  07/09/2023    1. Patient will be independent with home exercise program for the left upper extremity Baseline: 07/23/2023: Pt. continues to require assist. 05/28/2023: Pt. Requires assist 04/09/2023: Pt. Continues to consistently attempt to engage her hand at hand 02/01/2023: Pt. Consistently attempts to perform HEPs independently. No current home exercise program Goal status: Ongoing   LONG TERM GOALS: Target date:  08/20/2023   1.  Patient will improve left shoulder strength by 2 mm  grades to be able to sustain UE's in elevation long enough to wash her hair.  Baseline: 07/23/2023: Left shoulder flexion: 2/5, abduction: 2/5. 05/28/2023: Left shoulder flexion: 3-/5, abduction: 3-/5. Pt. has an old left shoulder injury limiting progression with left shoulder strength 05/26/2023: Pt. continues to present with limitiations in sustaining LUE elevation long enough to perform hair care thoroughly 04/09/2023: Pt. is limited with sustaining LUE elevation long enough to perform hair care thoroughly. 03/03/2023: Left shoulder flexion: 3-/5, abduction: 3-/5 01/22/2023: Left shoulder flexion: 3-/5, abduction: 3-/5 Eval: Left shoulder flexion: 3/5, abduction: 3-/5 Goal status: Revised, to continue   2.  Patient will improve left shoulder active abduction to be able to comb her hair Baseline: 07/23/2023: Left shoulder flexion: 60(70) abduction: 65(78) 05/28/2023: left shoulder flexion: 108 abduction: 85 Pt. Has an old left shoulder  injury limiting progression with left shoulder ROM 05/26/2023:  5/10 left shoulder pain with ROM limits using it functionally during hair care. 04/09/2023: Pt. Presents with difficulty abducting her left shoulder enough to thoroughly complete haircare. 03/03/2023: Shoulder abduction: 85 01/22/2023: Shoulder abduction: 85 Eval: Left shoulder abduction is 80(108) Goal status: Revised, to continue   3.  Patient will independently button her shirt with modified independence. Baseline: 07/23/2023 Pt. presents with difficulty buttoning her shirt. 05/28/2023: pt. Continues to work towards progressing with buttoning 05/26/2023:  Pt. Continues to progress towards buttoning. 04/09/2023: Pt. Continues to progress towards buttoning. 03/03/2023: Pt. Continues to have difficulty with buttoning. 01/22/2023: Pt. continues to have difficulty. Eval: Patient has difficulty.  Goal status: Ongoing   4.  Patient well improve left grip strength in preparation for securely holding flowers. Baseline: 07/23/2023: Grip strength: Right: 5#, Left: 2# 05/28/2023: Right: 26#, Left: 13#  pt. Presents with difficulty securely holding flowers in her left hand. 05/26/2023: TBD 04/09/2023: Pt. Is able to hold, and hike pants with the left hand, continues to have difficulty with securely holding flowers.   03/03/2023: left grip strength: 13# 01/22/2023: Left: 12# Eval: Pt. Is unable to securely hold flowers. Goal status: Ongoing   5.  Pt. will independently recall adaptive  strategies for performing ADL tasks including: flossing teeth, donning bra, applying makeup. Baseline: 07/23/2023: Continue 05/28/2023: Pt. Needs continued education about adaptive strategies.05/26/2023: Continue 04/09/2023: Continue3/26/2024: Continue 01/22/2023: Pt. continues to benefit from education about adaptive strategies during ADLs, and IADLs. Eval: Pt. to be provided with adaptive strategies. Goal status: Ongoing   6.  Pt. will improve FOTO score by 2 points to reflect patient  perceived performance improvement assessment specific ADLs  and IADLs Baseline: 7/84/6962: FOTO score: 57 05/28/2023: FOTO score 59 05/26/2023: TBD 5/02/204:  TBD 03/03/23: FOTO 62 Eval: 57 Goal status: Ongoing  7.  Pt. will improve left hand coordination skills in order to be able to handle, and sort utensils in a drawer.     Baseline: 07/23/2023: Right: 33 sec., Left:  Pt. Is unable to grasp, and place pegs into the pegboard. Pt. Was able to remove 9 vertical pegs in 34 sec. 05/28/2023: Left:  5 min. 05/26/2023 Pt. Continues to present with difficulty handling and sorting utensils. 04/14/2023: 56 04/09/2023: TBD 03/03/23: 1 min. & 39 01/22/2022: Left: 3 min. & 4 sec. Eval: Pt. has difficulty sorting, and placing utensils with the left hand. Left FMC : >5 min. For 7 pegs on the 9 hole peg test.    Goal Status:  Ongoing  8. Pt. will improve active left 2nd digit extension to be able able to isolate her 2nd digit  in preparation for pressing/pushing buttons on appliances, phones, or remotes. Baseline: 07/23/2023: Pt. Continues to work on consistency with isolating left 2nd digit extension 05/28/2023: Pt. Continues to improve with left 2nd digit extension 05/26/2023: Improving with left 2nd digit extension. 04/09/2023: Pt. Is progressing with isolating left 2nd digit extension, however continues to present with limited increased flexor tone. 03/03/23: Pt. Continues to work on improving consistency with 2nd digit extension to press the remote. 01/22/2023: is able to perform full digit extension, although 2nd digit is slow to extend s 2/2 flexor tone. Pt.  Eval: Pt. is able to is unable to actively perform full digit extension Goal status:  Ongoing   ASSESSMENT:   CLINICAL IMPRESSION:  Pt. presents with 9-10/10 pain in the left shoulder, and elbow today, and presents with left hand/wrist swelling. Pt./caregiver report that it started over the weekend. Pt. tolerated ROM, and contrast bath, however was unable to tolerate  the UBE today.  Pt./caregiver education was provided about positioning, and was provided with a HEP for contrasting heat/cold. Pt. continues to benefit from skilled OT services to work on improving BUE strength, and coordination skills, as well as left shoulder, elbow, and wrist strength, improving motor control and coordination in order to prepare the left upper extremity and hand for functional reaching and engaging the upper left extremity when performing hair care, buttoning, holding flowers, cutting food, and manipulating utensils to set the table as well as adaptive strategies during daily IADL care.     PERFORMANCE DEFICITS in functional skills including ADLs, IADLs, coordination, proprioception, ROM, strength, FMC, and GMC, cognitive skills including memory, and psychosocial skills including coping strategies, environmental adaptation, interpersonal interactions, and routines and behaviors.    IMPAIRMENTS are limiting patient from ADLs, IADLs, education, leisure, and social participation.    COMORBIDITIES may have co-morbidities  that affects occupational performance. Patient will benefit from skilled OT to address above impairments and improve overall function.   MODIFICATION OR ASSISTANCE TO COMPLETE EVALUATION: Min-Moderate modification of tasks or assist with assess necessary to complete an evaluation.   OT OCCUPATIONAL PROFILE AND HISTORY: Detailed assessment: Review of records and additional review of physical, cognitive, psychosocial history related to current functional performance.   CLINICAL DECISION MAKING: Moderate - several treatment options, min-mod task modification necessary   REHAB POTENTIAL: Good   EVALUATION COMPLEXITY: Moderate      PLAN: OT FREQUENCY: 2x/week   OT DURATION: 12 weeks   PLANNED INTERVENTIONS: self care/ADL training, therapeutic exercise, therapeutic activity, neuromuscular re-education, manual therapy, passive range of motion, functional mobility  training, electrical stimulation, and paraffin   RECOMMENDED OTHER SERVICES: PT   CONSULTED AND AGREED WITH PLAN OF CARE: Patient and family member/caregiver   PLAN FOR NEXT SESSION: see above    Olegario Messier, MS, OTR/L  08/11/2023

## 2023-08-13 ENCOUNTER — Ambulatory Visit: Payer: Medicare HMO | Admitting: Occupational Therapy

## 2023-08-13 DIAGNOSIS — R531 Weakness: Secondary | ICD-10-CM | POA: Diagnosis not present

## 2023-08-13 DIAGNOSIS — R278 Other lack of coordination: Secondary | ICD-10-CM | POA: Diagnosis not present

## 2023-08-13 DIAGNOSIS — R262 Difficulty in walking, not elsewhere classified: Secondary | ICD-10-CM | POA: Diagnosis not present

## 2023-08-13 DIAGNOSIS — R2681 Unsteadiness on feet: Secondary | ICD-10-CM | POA: Diagnosis not present

## 2023-08-13 DIAGNOSIS — M6281 Muscle weakness (generalized): Secondary | ICD-10-CM | POA: Diagnosis not present

## 2023-08-13 NOTE — Therapy (Signed)
Occupational Therapy Treatment Note  Patient Name: [name] MRN: [id number] DOB:1941-08-18, 82 y.o., female Today's Date: 08/13/2023  PCP: Dr. Judithann Sheen REFERRING PROVIDER:  Dr. Judithann Sheen  END OF SESSION:  PLAN:   OT End of Session - 08/13/23 1731     Visit Number 56    Number of Visits 72    Date for OT Re-Evaluation 08/20/23    OT Start Time 1622    OT Stop Time 1700    OT Time Calculation (min) 38 min    Equipment Utilized During Treatment Wheelchair    Activity Tolerance Patient tolerated treatment well    Behavior During Therapy WFL for tasks assessed/performed               Past Medical History:  Diagnosis Date   Anemia    Arthritis    Asthma    uses inhaler just prior to surgery to avoid attack   Back pain    from previous injury   Complication of anesthesia    has woken  up during 2 different surgery   Depression    no current issue/treatment; situation   Gallstones    GERD (gastroesophageal reflux disease)    Hiatal hernia    patient does NOT have nerve/muscle disease   History of kidney stones    HLD (hyperlipidemia)    HTN (hypertension)    Hypothyroidism    Kidney stones    Knee pain    Nausea and vomiting 10/15/2022   Non-diabetic pancreatic hormone dysfunction years   pt. 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   Pancreatitis    Pneumonia    Seizures (HCC)    caused by dye injected during a procedure   Shortness of breath    with exertion   Sinus problem    frequent infections/congestion   Stroke (HCC) 2021   reports having CVA in 2021 and having mini strokes before that   Thyroid disease    Past Surgical History:  Procedure Laterality Date   ABDOMINAL HYSTERECTOMY     APPENDECTOMY     CARPAL TUNNEL RELEASE  10+ years ago   bilateral   EYE SURGERY  3 yrs ago   bilateral cataracts   FOOT OSTEOTOMY  6 weeks ago   Left foot: great, 2nd & 3rd   FOOT OSTEOTOMY  5 years ago   Right great toe   HAND SURGERY Bilateral 2011-most  recent   multiple hand surgeries, 2 on left, 3 on right   KNEE ARTHROPLASTY Right 04/28/2022   Procedure: COMPUTER ASSISTED TOTAL KNEE ARTHROPLASTY;  Surgeon: Donato Heinz, MD;  Location: [facility];  Service: Orthopedics;  Laterality: Right;   LOOP RECORDER INSERTION N/A 05/16/2020   Procedure: LOOP RECORDER INSERTION;  Surgeon: Marcina Millard, MD;  Location: [facility];  Service: Cardiovascular;  Laterality: N/A;   NASAL SINUS SURGERY  most recent 7-8 yrs ago   7 sinus surgeries    TRIGGER FINGER RELEASE  11/19/2011   Procedure: RELEASE TRIGGER FINGER/A-1 PULLEY;  Surgeon: Nicki Reaper, MD;  Location: [facility];  Service: Orthopedics;  Laterality: Right;  release a-1 pulley right index finger and cyst removal   WRIST GANGLION EXCISION  1980's   right   Patient Active Problem List   Diagnosis Date Noted   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 11/27/2022   Hypomagnesemia 11/27/2022   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   HLD (hyperlipidemia) 11/23/2022   Asthma 11/23/2022   Depression  with anxiety 11/23/2022   Chronic diastolic CHF (congestive heart failure) (HCC) 11/23/2022   Normocytic anemia 11/23/2022   Aspiration pneumonia (HCC) 11/23/2022   AKI (acute kidney injury) (HCC) 11/23/2022   Abdominal pain 11/23/2022   Mesenteric mass 11/23/2022   Coffee ground emesis 11/23/2022   Nausea and vomiting 10/15/2022   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   Fatigue 09/17/2022   Left hemiparesis (HCC) 09/17/2022   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HCC) 08/22/2022   GERD (gastroesophageal reflux disease) 08/21/2022   Agitation 08/20/2022   Acute left-sided weakness 08/20/2022   Expressive aphasia    Stroke (HCC) 08/19/2022   Leukocytosis 08/19/2022   History of urticaria 04/28/2022   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   Primary osteoarthritis of left knee 02/24/2022   Primary osteoarthritis of right knee 02/24/2022   Lumbar  spondylolysis 04/12/2020   History of CVA (cerebrovascular accident) 03/26/2020   Low back pain radiating to right lower extremity 03/21/2020   B12 deficiency 03/06/2020   Positive anti-CCP test 12/21/2019   Arthralgia 12/13/2019   Dermatitis 12/13/2019   Rheumatoid factor positive 12/13/2019   Essential hypertension 12/11/2018   Palpitations 12/11/2018   Acquired hypothyroidism 11/10/2018   Arthritis of knee 09/17/2016   Anxiety 11/22/2014   Asthma without status asthmaticus 11/22/2014   Benign neoplasm of colon, unspecified 11/22/2014   Environmental allergies 11/22/2014   Hypertriglyceridemia 11/22/2014   Hypokalemia 11/22/2014   Personal history of disease of skin and subcutaneous tissue 11/22/2014   REFERRING DIAG: CVA   THERAPY DIAG:  Muscle weakness (generalized)   Other lack of coordination   Rationale for Evaluation and Treatment Rehabilitation   SUBJECTIVE:   SUBJECTIVE STATEMENT:  Pt reports 9-10/10 pain in her left arm  Pt accompanied by: significant other   PERTINENT HISTORY: Patient is an 82 year-old female who was admitted to [facility] on 11/23/2022 with a cerebellar CVA. 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 Pt. Was admitted to Inpatient Rehab from 12/21-1/02/2023. 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  Patient underwent inpatient rehabilitation for 2 weeks.  Patient was assessed and was scheduled for a knee replacement on 08/29/2022 however had to cancel it due to having had a CVA.  Patient had a recent fall 2 days after discharging from inpatient rehab. Past Medical History includes: Knee replacement, essential HTN, hypokalemia, leukocytosis, seizures, positive anti-- CCP test, anxiety disorder, mini strokes.  Patient had shingles with left eye nerve pain s/p 1 year ago.    PRECAUTIONS: Fall   WEIGHT BEARING RESTRICTIONS No   PAIN:  Are you having pain? 8/10 Left  shoulder pain, and stiffness initially.   FALLS: Has patient fallen in last 6 months? Yes. Number of falls 1   LIVING ENVIRONMENT: Lives with: Lives with Spouse Lives in: House/apartment Stairs: 2 storey home, resides on the first floor.  External: 2 stairs front no rails, and 6 in back with rails Has following equipment at home: Single point cane, Walker - 2 wheeled, Environmental consultant - 4 wheeled, Shower bench, and bed side commode   PLOF: Independent   PATIENT GOALS  To Regain the use of her left arm   OBJECTIVE:    HAND DOMINANCE: Right   ADLs: Overall ADLs: Husband assists pt. as needed Transfers/ambulation related to ADLs:Pt. Uses a 3 wheeled walker with Husband assist. Eating: Pt. Is independent with the right hand. Pt. has difficulty cutting food. Grooming: Pt. is using her right hand, however has difficulty sustaining her LUE  in elevation to assist with haircare. UB Dressing: Pt. Is independent donning a pullover shirt, and button down shirt. Has difficulty with buttoning, LB Dressing:  Independent donning pants, and socks. Difficulty tying shoes. Toileting: Independent Bathing: Pt. Is able to engage her right hand. Tub Shower transfers: Supervision Equipment: See above for equipment    IADLs: Shopping:  Has not had the opportunity for grocery shopping yet Light housekeeping: Husband is assisting with light house keeping Meal Prep:  Dependent Community mobility: Relies of family/friends Medication management: Husband assisting with weekly pillbox set-up, and administering medication. Financial management: TBD Handwriting: 75% legible   MOBILITY STATUS: Hx of falls   POSTURE COMMENTS:  No Significant postural limitations Sitting balance: supported sitting balance WFL   ACTIVITY TOLERANCE: Activity tolerance:  Fatigues in greater than 30 min.    FUNCTIONAL OUTCOME MEASURES: FOTO: 57   UPPER EXTREMITY ROM      Active ROM Right Eval: WFL Left eval Left  01/22/23 Left   03/03/23 Left  04/14/23 Left  05/28/2023 Left 07/23/2023  Shoulder flexion   132 100 108 108 108 60(70)  Shoulder abduction   80 85 85 85 85 65(78)  Shoulder adduction           Shoulder extension           Shoulder internal rotation           Shoulder external rotation           Elbow flexion   140 140 [facility] WFL [facility] WFL  Elbow extension   WNL WNL [facility] WFL WFL  Wrist flexion   65 68      Wrist extension   -10 20 24  32 32 -10  Wrist ulnar deviation     12 10 14 14 10   Wrist radial deviation     8 14 14 16 12   Wrist pronation           Wrist supination           (Blank rows = not tested)   Left digit flexion to [facility]: 2nd: 0cm, 3rd: 0cm, 4th: 0cm, 5th: 0cm   Limited Left full 2nd digit extension     UPPER EXTREMITY MMT:      MMT Right Eval: 4+/5 overall Right 07/23/2023 Left Eval Left 01/22/23 Left 03/03/23 Left  04/14/2023 Left 05/28/2023 Left 07/23/2023  Shoulder flexion   3+/5 3/5 3-/5 3-/5 3-/5 3-/5 2/5  Shoulder abduction   3+/5 3-/5 3-/5 3-/5 3-/5 3-/5 2/5  Shoulder adduction            Shoulder extension            Shoulder internal rotation            Shoulder external rotation            Middle trapezius            Lower trapezius            Elbow flexion   4/5 3+/5 4/5 N/A 4/5 4/5 3+/5  Elbow extension   4/5 3+/5 4/4 N/A 4/5 4/5 3+/5  Wrist flexion            Wrist extension   4/5 2-/5 3-/5 3-/5 3/5 3+/5 3+/5  Wrist ulnar deviation            Wrist radial deviation            Wrist pronation  Wrist supination            (Blank rows = not tested)   HAND FUNCTION: Grip strength: Right: 26#, Left: 10# Pinch strength: Right 8#, Left: 3#, 3 Pt. Pinch strength: Right: 9#, L: 2#  01/22/2023 Grip strength: Right: 26#, Left: 12# Pinch strength:  Pinch meter used at the initial eval has been sent out for recalibration   03/03/2023 Grip strength: Right: 26#, Left: 13# Pinch strength:  Pinch meter used at the initial eval has been sent out for  recalibration  04/14/2023: Grip strength: Right: 26#, Left: 13# Pinch strength:   Right 8#, Left: 4#, 3 Pt. Pinch strength: Right: 9#, L: 3#    05/28/2023: Grip strength: Right: 26#, Left: 13# Pinch strength:   Right 8#, Left: 2#, 3 Pt. Pinch strength: Right: 9#, L: 4#  07/23/2023  Grip strength: Right: 5#, Left: 2# Pinch strength:   Right 6#, Left: 2#, 3 Pt. Pinch strength: Right: 5#, L: 2#    COORDINATION: Right: 22 sec., Left: <5 min. To place 7 pegs with increased compensation proximally in the trunk, and through reflexive associated reactions.  01/22/23 Right: 22 sec., Left: 3 min. & 4 sec.  03/03/23 Right: 22 sec., Left: 1 min. & 39 sec.  04/14/23   TBD  05/28/23 Right: 22 sec., Left:  5 min.  07/23/2023  Right: 33 sec., Left:  Pt. Is unable to grasp, and place pegs into the pegboard. Pt. Was able to remove 9 vertical pegs in 34 sec.    SENSATION: Light touch: WFL, proprioceptive awareness: Intact   EDEMA: N/A   MUSCLE TONE: LUE: Hypotonic   COGNITION: Overall cognitive status: WFL for tasks assessed. Pt. Is impulsive at times.   VISION: Subjective report: Pt. report having shingles affecting left eye  s/p 1 year. Has nerve pain Baseline vision: Wears glasses for reading only Visual history:  updated see clinical impression   VISION ASSESSMENT:    WFL for tasks performed   PERCEPTION: Intact   PRAXIS: Impaired: Motor planning   OBSERVATIONS:  Pt. more alert, and engaging since prior to the most recent hospitalization.   TODAY'S TREATMENT:    Contrast Bath:  Pt. tolerated alternating contrasting heat pack 3 min. X's cold pack 1 min. for a trial of 3 reps for a total of 12 min. 2/2 left hand/wrist pain, and swelling.  There. Ex.:  Pt. Tolerated PROM/AAROM to the Left shoulder, elbow, wrist, and digits.  Manual Therapy:  Pt. tolerated soft tissue massage to the left scapular, and shoulder regions. Pt. Tolerated retrograde massage to the left wrist.  Manual therapy was performed independent of, and in preparation for therapeutic Ex.            PATIENT EDUCATION: Education details: LUE therapeutic exercise Person educated: Patient and Spouse Education method: Medical illustrator Education comprehension: verbalized understanding, returned demonstration, and needs further education   HOME EXERCISE PROGRAM:   Reviewed activities at home to promote isolated 2nd digit extension.    GOALS: Goals reviewed with patient? Yes   SHORT TERM GOALS: Target date:  07/09/2023    1. Patient will be independent with home exercise program for the left upper extremity Baseline: 07/23/2023: Pt. continues to require assist. 05/28/2023: Pt. Requires assist 04/09/2023: Pt. Continues to consistently attempt to engage her hand at hand 02/01/2023: Pt. Consistently attempts to perform HEPs independently. No current home exercise program Goal status: Ongoing   LONG TERM GOALS: Target date:  08/20/2023   1.  Patient  will improve left shoulder strength by 2 mm grades to be able to sustain UE's in elevation long enough to wash her hair.  Baseline: 07/23/2023: Left shoulder flexion: 2/5, abduction: 2/5. 05/28/2023: Left shoulder flexion: 3-/5, abduction: 3-/5. Pt. has an old left shoulder injury limiting progression with left shoulder strength 05/26/2023: Pt. continues to present with limitiations in sustaining LUE elevation long enough to perform hair care thoroughly 04/09/2023: Pt. is limited with sustaining LUE elevation long enough to perform hair care thoroughly. 03/03/2023: Left shoulder flexion: 3-/5, abduction: 3-/5 01/22/2023: Left shoulder flexion: 3-/5, abduction: 3-/5 Eval: Left shoulder flexion: 3/5, abduction: 3-/5 Goal status: Revised, to continue   2.  Patient will improve left shoulder active abduction to be able to comb her hair Baseline: 07/23/2023: Left shoulder flexion: 60(70) abduction: 65(78) 05/28/2023: left shoulder flexion: 108 abduction: 85 Pt.  Has an old left shoulder injury limiting progression with left shoulder ROM 05/26/2023:  5/10 left shoulder pain with ROM limits using it functionally during hair care. 04/09/2023: Pt. Presents with difficulty abducting her left shoulder enough to thoroughly complete haircare. 03/03/2023: Shoulder abduction: 85 01/22/2023: Shoulder abduction: 85 Eval: Left shoulder abduction is 80(108) Goal status: Revised, to continue   3.  Patient will independently button her shirt with modified independence. Baseline: 07/23/2023 Pt. presents with difficulty buttoning her shirt. 05/28/2023: pt. Continues to work towards progressing with buttoning 05/26/2023:  Pt. Continues to progress towards buttoning. 04/09/2023: Pt. Continues to progress towards buttoning. 03/03/2023: Pt. Continues to have difficulty with buttoning. 01/22/2023: Pt. continues to have difficulty. Eval: Patient has difficulty.  Goal status: Ongoing   4.  Patient well improve left grip strength in preparation for securely holding flowers. Baseline: 07/23/2023: Grip strength: Right: 5#, Left: 2# 05/28/2023: Right: 26#, Left: 13#  pt. Presents with difficulty securely holding flowers in her left hand. 05/26/2023: TBD 04/09/2023: Pt. Is able to hold, and hike pants with the left hand, continues to have difficulty with securely holding flowers.   03/03/2023: left grip strength: 13# 01/22/2023: Left: 12# Eval: Pt. Is unable to securely hold flowers. Goal status: Ongoing   5.  Pt. will independently recall adaptive  strategies for performing ADL tasks including: flossing teeth, donning bra, applying makeup. Baseline: 07/23/2023: Continue 05/28/2023: Pt. Needs continued education about adaptive strategies.05/26/2023: Continue 04/09/2023: Continue3/26/2024: Continue 01/22/2023: Pt. continues to benefit from education about adaptive strategies during ADLs, and IADLs. Eval: Pt. to be provided with adaptive strategies. Goal status: Ongoing   6.  Pt. will improve FOTO score by 2  points to reflect patient perceived performance improvement assessment specific ADLs  and IADLs Baseline: 1/61/0960: FOTO score: 57 05/28/2023: FOTO score 59 05/26/2023: TBD 5/02/204:  TBD 03/03/23: FOTO 62 Eval: 57 Goal status: Ongoing  7.  Pt. will improve left hand coordination skills in order to be able to handle, and sort utensils in a drawer.     Baseline: 07/23/2023: Right: 33 sec., Left:  Pt. Is unable to grasp, and place pegs into the pegboard. Pt. Was able to remove 9 vertical pegs in 34 sec. 05/28/2023: Left:  5 min. 05/26/2023 Pt. Continues to present with difficulty handling and sorting utensils. 04/14/2023: 56 04/09/2023: TBD 03/03/23: 1 min. & 39 01/22/2022: Left: 3 min. & 4 sec. Eval: Pt. has difficulty sorting, and placing utensils with the left hand. Left FMC : >5 min. For 7 pegs on the 9 hole peg test.    Goal Status:  Ongoing  8. Pt. will improve active left 2nd digit extension to  be able able to isolate her 2nd digit in preparation for pressing/pushing buttons on appliances, phones, or remotes. Baseline: 07/23/2023: Pt. Continues to work on consistency with isolating left 2nd digit extension 05/28/2023: Pt. Continues to improve with left 2nd digit extension 05/26/2023: Improving with left 2nd digit extension. 04/09/2023: Pt. Is progressing with isolating left 2nd digit extension, however continues to present with limited increased flexor tone. 03/03/23: Pt. Continues to work on improving consistency with 2nd digit extension to press the remote. 01/22/2023: is able to perform full digit extension, although 2nd digit is slow to extend s 2/2 flexor tone. Pt.  Eval: Pt. is able to is unable to actively perform full digit extension Goal status:  Ongoing   ASSESSMENT:   CLINICAL IMPRESSION:  Pt. presents with 5/10 pain in the left shoulder abduction today. Pt. presents with left hand/wrist swelling, however responded well to contrast bath, and retrograde massage. Pt./caregiver report that it started  over the weekend. Pt. tolerated ROM, massage, and contrast bath, UE there. Ex., and the UBE today.  Pt./caregiver education was provided about positioning for shoulder abduction with a pillow at home, and was provided with a HEP for contrasting heat/cold. Pt. continues to benefit from skilled OT services to work on improving BUE strength, and coordination skills, as well as left shoulder, elbow, and wrist strength, improving motor control and coordination in order to prepare the left upper extremity and hand for functional reaching and engaging the upper left extremity when performing hair care, buttoning, holding flowers, cutting food, and manipulating utensils to set the table as well as adaptive strategies during daily IADL care.     PERFORMANCE DEFICITS in functional skills including ADLs, IADLs, coordination, proprioception, ROM, strength, FMC, and GMC, cognitive skills including memory, and psychosocial skills including coping strategies, environmental adaptation, interpersonal interactions, and routines and behaviors.    IMPAIRMENTS are limiting patient from ADLs, IADLs, education, leisure, and social participation.    COMORBIDITIES may have co-morbidities  that affects occupational performance. Patient will benefit from skilled OT to address above impairments and improve overall function.   MODIFICATION OR ASSISTANCE TO COMPLETE EVALUATION: Min-Moderate modification of tasks or assist with assess necessary to complete an evaluation.   OT OCCUPATIONAL PROFILE AND HISTORY: Detailed assessment: Review of records and additional review of physical, cognitive, psychosocial history related to current functional performance.   CLINICAL DECISION MAKING: Moderate - several treatment options, min-mod task modification necessary   REHAB POTENTIAL: Good   EVALUATION COMPLEXITY: Moderate      PLAN: OT FREQUENCY: 2x/week   OT DURATION: 12 weeks   PLANNED INTERVENTIONS: self care/ADL training,  therapeutic exercise, therapeutic activity, neuromuscular re-education, manual therapy, passive range of motion, functional mobility training, electrical stimulation, and paraffin   RECOMMENDED OTHER SERVICES: PT   CONSULTED AND AGREED WITH PLAN OF CARE: Patient and family member/caregiver   PLAN FOR NEXT SESSION: see above    Olegario Messier, MS, OTR/L  08/13/2023

## 2023-08-18 ENCOUNTER — Ambulatory Visit: Payer: Medicare HMO | Admitting: Occupational Therapy

## 2023-08-18 ENCOUNTER — Ambulatory Visit: Payer: Medicare HMO

## 2023-08-18 DIAGNOSIS — R278 Other lack of coordination: Secondary | ICD-10-CM

## 2023-08-18 DIAGNOSIS — R262 Difficulty in walking, not elsewhere classified: Secondary | ICD-10-CM | POA: Diagnosis not present

## 2023-08-18 DIAGNOSIS — M6281 Muscle weakness (generalized): Secondary | ICD-10-CM

## 2023-08-18 DIAGNOSIS — R2681 Unsteadiness on feet: Secondary | ICD-10-CM

## 2023-08-18 DIAGNOSIS — R531 Weakness: Secondary | ICD-10-CM | POA: Diagnosis not present

## 2023-08-18 NOTE — Therapy (Signed)
OUTPATIENT PHYSICAL THERAPY NEURO TREATMENT   Patient Name: ROCQUEL DAPPER MRN: [id number] DOB:[birth year], 82 y.o., female Today's Date: 08/18/2023   PCP: Aram Beecham, D MD REFERRING PROVIDER: Jacquelynn Cree PA   END OF SESSION:  PT End of Session - 08/06/23 1008     Visit Number 16   Number of Visits 37    Date for PT Re-Evaluation 09/29/23    Authorization Type 3/10 eval 04/14/23    Progress Note Due on Visit 20    PT Start Time 1446    PT Stop Time 1529    PT Time Calculation (min) 43 min    Equipment Utilized During Treatment Gait belt    Activity Tolerance Patient tolerated treatment well    Behavior During Therapy WFL for tasks assessed/performed                  Past Medical History:  Diagnosis Date   Anemia    Arthritis    Asthma    uses inhaler just prior to surgery to avoid attack   Back pain    from previous injury   Complication of anesthesia    has woken  up during 2 different surgery   Depression    no current issue/treatment; situation   Gallstones    GERD (gastroesophageal reflux disease)    Hiatal hernia    patient does NOT have nerve/muscle disease   History of kidney stones    HLD (hyperlipidemia)    HTN (hypertension)    Hypothyroidism    Kidney stones    Knee pain    Nausea and vomiting 10/15/2022   Non-diabetic pancreatic hormone dysfunction years   pt. 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   Pancreatitis    Pneumonia    Seizures (HCC)    caused by dye injected during a procedure   Shortness of breath    with exertion   Sinus problem    frequent infections/congestion   Stroke (HCC) 2021   reports having CVA in 2021 and having mini strokes before that   Thyroid disease    Past Surgical History:  Procedure Laterality Date   ABDOMINAL HYSTERECTOMY     APPENDECTOMY     CARPAL TUNNEL RELEASE  10+ years ago   bilateral   EYE SURGERY  3 yrs ago   bilateral cataracts   FOOT OSTEOTOMY  6 weeks ago   Left foot: great,  2nd & 3rd   FOOT OSTEOTOMY  5 years ago   Right great toe   HAND SURGERY Bilateral 2011-most recent   multiple hand surgeries, 2 on left, 3 on right   KNEE ARTHROPLASTY Right 04/28/2022   Procedure: COMPUTER ASSISTED TOTAL KNEE ARTHROPLASTY;  Surgeon: Donato Heinz, MD;  Location: [facility];  Service: Orthopedics;  Laterality: Right;   LOOP RECORDER INSERTION N/A 05/16/2020   Procedure: LOOP RECORDER INSERTION;  Surgeon: Marcina Millard, MD;  Location: [facility];  Service: Cardiovascular;  Laterality: N/A;   NASAL SINUS SURGERY  most recent 7-8 yrs ago   7 sinus surgeries    TRIGGER FINGER RELEASE  11/19/2011   Procedure: RELEASE TRIGGER FINGER/A-1 PULLEY;  Surgeon: Nicki Reaper, MD;  Location: [facility];  Service: Orthopedics;  Laterality: Right;  release a-1 pulley right index finger and cyst removal   WRIST GANGLION EXCISION  1980's   right   Patient Active Problem List   Diagnosis Date Noted   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 11/27/2022  Hypomagnesemia 11/27/2022   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   HLD (hyperlipidemia) 11/23/2022   Asthma 11/23/2022   Depression with anxiety 11/23/2022   Chronic diastolic CHF (congestive heart failure) (HCC) 11/23/2022   Normocytic anemia 11/23/2022   Aspiration pneumonia (HCC) 11/23/2022   AKI (acute kidney injury) (HCC) 11/23/2022   Abdominal pain 11/23/2022   Mesenteric mass 11/23/2022   Coffee ground emesis 11/23/2022   Nausea and vomiting 10/15/2022   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   Fatigue 09/17/2022   Left hemiparesis (HCC) 09/17/2022   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HCC) 08/22/2022   GERD (gastroesophageal reflux disease) 08/21/2022   Agitation 08/20/2022   Acute left-sided weakness 08/20/2022   Expressive aphasia    Stroke (HCC) 08/19/2022   Leukocytosis 08/19/2022   History of urticaria 04/28/2022   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   Primary  osteoarthritis of left knee 02/24/2022   Primary osteoarthritis of right knee 02/24/2022   Lumbar spondylolysis 04/12/2020   History of CVA (cerebrovascular accident) 03/26/2020   Low back pain radiating to right lower extremity 03/21/2020   B12 deficiency 03/06/2020   Positive anti-CCP test 12/21/2019   Arthralgia 12/13/2019   Dermatitis 12/13/2019   Rheumatoid factor positive 12/13/2019   Essential hypertension 12/11/2018   Palpitations 12/11/2018   Acquired hypothyroidism 11/10/2018   Arthritis of knee 09/17/2016   Anxiety 11/22/2014   Asthma without status asthmaticus 11/22/2014   Benign neoplasm of colon, unspecified 11/22/2014   Environmental allergies 11/22/2014   Hypertriglyceridemia 11/22/2014   Hypokalemia 11/22/2014   Personal history of disease of skin and subcutaneous tissue 11/22/2014    ONSET DATE: 11/23/22  REFERRING DIAG: TIA  THERAPY DIAG:  Muscle weakness (generalized)  Difficulty in walking, not elsewhere classified  Unsteadiness on feet  Other lack of coordination  Rationale for Evaluation and Treatment: Rehabilitation  SUBJECTIVE:                                                                                                                                                                                             SUBJECTIVE STATEMENT:  Patient and husband reports dizziness has improved since last session. Had an episode of vomiting after standing up 2-3 days ago.   Pt accompanied by: significant other  PERTINENT HISTORY: Patient is an 82 year-old female who was admitted to [facility] on 11/23/2022 with a cerebellar CVA. 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 Pt. Was admitted to Inpatient Rehab from 12/21-1/02/2023. 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  Patient underwent inpatient rehabilitation for 2 weeks.  Patient was assessed and was scheduled for  a knee  replacement on 08/29/2022 however had to cancel it due to having had a CVA.   PMH includes: anemia, arthritis, back pain, depression, hernia, GERD, HLD, HTN, hypothyroidism, knee pain, pancreatic hormone dysfunction, pneumonia, seizures, stroke, thyroid disease.  Patient has a donjoy brace for L knee. Walking with hand hold help with husband.   PAIN:  Are you having pain? Yes: NPRS scale: 0/10 Pain location: L knee Pain description: aching, gives out Aggravating factors: climbing into pain, moving it, walking around house Relieving factors: rest   PRECAUTIONS: Fall, has latex allergy  WEIGHT BEARING RESTRICTIONS: No  FALLS: Has patient fallen in last 6 months? Yes. Number of falls 4 falls   LIVING ENVIRONMENT: Lives with: lives with their family and lives with their spouse Lives in: House/apartment Stairs: Yes: Internal: flight steps;   and External: 2 steps; on right going up and on left going up Has following equipment at home: Single point cane, Kellie Murrill - 2 wheeled, Shower bench, and bed side commode  PLOF: Independent  PATIENT GOALS: to have less pain and move better  OBJECTIVE:   DIAGNOSTIC FINDINGS:  Disc levels:   C2-3: No significant stenosis.   C3-4: A rightward disc osteophyte complex effaces the ventral CSF. Severe left and moderate right foraminal stenosis is present.   C4-5: A broad-based disc osteophyte complex effaces the ventral CSF. The canal is narrowed 7 mm. Severe left and moderate right foraminal stenosis is present.   C5-6: Chronic loss of disc height is present. Broad-based disc osteophyte complex is asymmetric to the right. Partial effacement of the ventral CSF is present. Moderate foraminal stenosis is worse right than left.   C6-7: A broad-based disc osteophyte complex is present. This partially effaces the ventral CSF. Moderate foraminal narrowing is worse right than left.   C7-T1: Uncovertebral spurring is present bilaterally. Mild  bilateral foraminal stenosis is present.   IMPRESSION: 1. Multilevel spondylosis of the cervical spine as described. 2. 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 3. 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 4. No other significant cord signal abnormality to suggest ischemic changes to the cord. 5. Severe left and moderate right foraminal stenosis at C3-4 and C4-5. 6. Moderate foraminal stenosis bilaterally at C5-6 and C6-7 is worse on the right. 7. Mild bilateral foraminal narrowing at C7-T1. 8. Prominent soft tissue pannus at C1-2 effaces the ventral CSF. 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  IMPRESSION: 1. 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 2. 9 mm right occipital pole infarct. 3. Punctate white matter infarct in the left occipital lobe. 4. Additional punctate cortical infarct in the medial right occipital lobe more superiorly. 5. Expected evolution of previous right posterior frontal lobe white matter infarct. 6. Stable atrophy and white matter disease likely reflects the sequela of chronic microvascular ischemia.  COGNITION: Overall cognitive status:  two strokes   SENSATION: WFL  COORDINATION: Heel slide test limited by LLE pain  POSTURE: rounded shoulders, forward head, and flexed trunk   LOWER EXTREMITY ROM:     Bowing of L knee  LOWER EXTREMITY MMT:    MMT Right Eval Left Eval R 7/16 L 7/16  Hip flexion 3 3* 3+ 3+  Hip extension      Hip abduction 3 3* 4- 4-  Hip adduction 3 * 4- 4-  Hip internal rotation  Hip external rotation      Knee flexion 3 3* 4- 4-  Knee extension 3 3* 4- 4-  Ankle dorsiflexion 4 4 4 4   Ankle plantarflexion 4 4 4 4   (Blank rows = not tested) * pain  BED MOBILITY:  Per patient she has difficulty, occasionally needs  help  TRANSFERS: Assistive device utilized: None  Sit to stand: CGA and Min A Stand to sit: Min A Chair to chair: CGA    GAIT: Gait pattern: step to pattern, decreased stance time- Left, genu recurvatum- Left, and shuffling Distance walked: 10 ft Assistive device utilized: None Level of assistance: CGA Comments: Patient is very painful with ambulation, unable to ambulate longer duration   FUNCTIONAL TESTS:  5 times sit to stand: unable to tolerate 10 meter walk test: unable to tolerate  PATIENT SURVEYS:  FOTO 47  TODAY'S TREATMENT:                                                                                                                              DATE: 08/18/23    Orthostatic BPs  Sitting 104/55 HR 71  Standing 114/59 HR 78  Standing after 2 min 115/50 HR 78  Unable to stand for full 3 mins due to significant fatigue and feeling like her legs were going to give out  TA: Sit<>stand from WC 3 x 5 with CGA  assist.  Seated LAQ 2 x 10 each LE  Seated marching 2 x 15 each LE Seated hip adduction ball squeeze 2 x 10  Seated heel/toe raises 2 x 15 each LE  Ambulation towards OT table with RW x 10ft with CGA-minA. Reports of significant L knee pain and demonstrating decreased stance time on L with short mobility.   PATIENT EDUCATION: Education details: Pt educated throughout session about proper posture and technique with exercises. Improved exercise technique, movement at target joints, use of target muscles after min to mod verbal, visual, tactile cues.   Importance of HEP. Continued education on  importance of nutrition and hydration prior to PT treatment to prevent orthostatic hypotension from low fluid volumes.   Possible D/C in favor of HHPT if lethargy continues.   Person educated: Patient and Spouse Education method: Explanation, Demonstration, Tactile cues, and Verbal cues Education comprehension: verbalized understanding, returned demonstration, verbal  cues required, and tactile cues required  HOME EXERCISE PROGRAM:   07/07/23: Access Code: [email or web address] URL: [email or web address] Date: 07/07/2023 Prepared by: Temple Pacini  Exercises - Supine Bridge  - 1 x daily - 3-6 x weekly - 2 sets - 5-10 reps - Supine Heel Slide  - 1 x daily - 3-6 x weekly - 2 sets - 5-10 reps - Supine March  - 1 x daily - 3-6 x weekly - 2 sets - 5-10 reps  Access Code: [email or web address] URL: [email or web address] Date: 06/23/2023 Prepared by: Grier Rocher  Exercises - Standing March with Counter Support  - 1 x daily - 7 x  weekly - 2 sets - 10 reps - Standing Hip Abduction with Unilateral Counter Support  - 1 x daily - 7 x weekly - 2 sets - 8 reps  Access Code: [email or web address] URL: [email or web address] Date: 04/14/2023 Prepared by: Precious Bard  Exercises - Seated Single Leg Hip Abduction  - 1 x daily - 7 x weekly - 2 sets - 10 reps - 5 hold - Seated Heel Slide  - 1 x daily - 7 x weekly - 2 sets - 10 reps - 5 hold - Seated Heel Toe Raises  - 1 x daily - 7 x weekly - 2 sets - 10 reps - 5 hold  GOALS: Goals reviewed with patient? Yes  SHORT TERM GOALS: Target date: 08/18/2023      Patient will be independent in home exercise program to improve strength/mobility for better functional independence with ADLs. Baseline: 5/7; 07/07/23: not performing HEP, provided education on importance of HEP to make gains Goal status: IN PROGRESS    LONG TERM GOALS: Target date: 09/29/2023     Patient will increase FOTO score to equal to or greater than   57%  to demonstrate statistically significant improvement in mobility and quality of life.  Baseline:  47%  7/16: 60%  Goal status: MET at assessment 7/16  2.  Patient (> 71 years old) will complete five times sit to stand test in < 15 seconds indicating an increased LE strength and improved balance. Baseline: 5/7: unable to tolerate test  7/16: 17.4sec  Goal status: IN  PROGRESS  3.   Patient will deny any falls over past 4 weeks to demonstrate improved safety awareness at home and work.  Baseline: 5/7: multiple falls   1 fall from bed last week. ; 07/07/23: no falls  Goal status: MET  4.  Patient will increase BLE gross strength to 4+/5 as to improve functional strength for independent gait, increased standing tolerance and increased ADL ability Baseline: 5/7: see above  7/16: improved to grossly 4-/5 overall  Goal status: INITIAL  5.  Patient will be able to perform household work/ chores without increase in symptoms. Baseline: 5/7: unable to perform   7/16: Pt reports that she has returned to doing some housework again, but reports that she is performing from [facility]. ; 07/07/23: pt completing basic ADLs, but otherwise not doing household chores  Goal status: IN PROGRESS  6. Pt will increase gait speed to >0.8 m/s to indicate reduced fall risk and improved safety with house hold level ambulation.  Baseline: 7/16: 0.47m/s   Goal status: New      ASSESSMENT:  CLINICAL IMPRESSION:   Patient arrives to treatment session with husband at side. Session focused on orthostatic vital assessment with BP WNL and no s/s of orthostasis. Worked on seated LE strengthening as patient unable to tolerate standing due to significant L knee pain. At this time, The patient will benefit from skilled physical therapy to reduce pain, improve mobility, improve strength and improve quality of life.     OBJECTIVE IMPAIRMENTS: Abnormal gait, cardiopulmonary status limiting activity, decreased activity tolerance, decreased balance, decreased cognition, decreased endurance, decreased knowledge of use of DME, decreased mobility, difficulty walking, decreased ROM, decreased strength, decreased safety awareness, hypomobility, increased fascial restrictions, impaired perceived functional ability, impaired flexibility, impaired UE functional use, improper body mechanics, postural  dysfunction, and pain.   ACTIVITY LIMITATIONS: carrying, lifting, bending, sitting, standing, squatting, stairs, transfers, bed mobility, bathing, toileting, dressing, reach over head, hygiene/grooming, locomotion level, and caring for  others  PARTICIPATION LIMITATIONS: meal prep, cleaning, laundry, medication management, personal finances, interpersonal relationship, driving, shopping, community activity, yard work, school, and church  PERSONAL FACTORS: Age, Behavior pattern, Education, Fitness, Past/current experiences, Time since onset of injury/illness/exacerbation, and 3+ comorbidities: anemia, arthritis, back pain, depression, hernia, GERD, HLD, HTN, hypothyroidism, knee pain, pancreatic hormone dysfunction, pneumonia, seizures, stroke, thyroid disease  are also affecting patient's functional outcome.   REHAB POTENTIAL: Fair    CLINICAL DECISION MAKING: Evolving/moderate complexity  EVALUATION COMPLEXITY: Moderate  PLAN:  PT FREQUENCY: 1-2x/week  PT DURATION: 12 weeks  PLANNED INTERVENTIONS: Therapeutic exercises, Therapeutic activity, Neuromuscular re-education, Balance training, Gait training, Patient/Family education, Self Care, Joint mobilization, Joint manipulation, Stair training, Vestibular training, Canalith repositioning, Visual/preceptual remediation/compensation, Orthotic/Fit training, DME instructions, Electrical stimulation, Wheelchair mobility training, Spinal mobilization, Cryotherapy, Moist heat, Compression bandaging, scar mobilization, Splintting, Taping, Traction, Ultrasound, Manual therapy, and Re-evaluation  PLAN FOR NEXT SESSION:  Standing tolerance and BLE strength training.  Viviann Spare, PT, DPT 08/18/2023, 2:46 PM

## 2023-08-18 NOTE — Therapy (Addendum)
Occupational Therapy Treatment Note  Patient Name: [name] MRN: [id number] DOB:Jan 04, 1941, 82 y.o., female Today's Date: 08/18/2023  PCP: Dr. Judithann Sheen REFERRING PROVIDER:  Dr. Judithann Sheen  END OF SESSION:  PLAN:   OT End of Session - 08/18/23 1634     Visit Number 57    Number of Visits 72    Date for OT Re-Evaluation 08/20/23    OT Start Time 1615    OT Stop Time 1700    OT Time Calculation (min) 45 min    Activity Tolerance Patient tolerated treatment well    Behavior During Therapy WFL for tasks assessed/performed               Past Medical History:  Diagnosis Date   Anemia    Arthritis    Asthma    uses inhaler just prior to surgery to avoid attack   Back pain    from previous injury   Complication of anesthesia    has woken  up during 2 different surgery   Depression    no current issue/treatment; situation   Gallstones    GERD (gastroesophageal reflux disease)    Hiatal hernia    patient does NOT have nerve/muscle disease   History of kidney stones    HLD (hyperlipidemia)    HTN (hypertension)    Hypothyroidism    Kidney stones    Knee pain    Nausea and vomiting 10/15/2022   Non-diabetic pancreatic hormone dysfunction years   pt. 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   Pancreatitis    Pneumonia    Seizures (HCC)    caused by dye injected during a procedure   Shortness of breath    with exertion   Sinus problem    frequent infections/congestion   Stroke (HCC) 2021   reports having CVA in 2021 and having mini strokes before that   Thyroid disease    Past Surgical History:  Procedure Laterality Date   ABDOMINAL HYSTERECTOMY     APPENDECTOMY     CARPAL TUNNEL RELEASE  10+ years ago   bilateral   EYE SURGERY  3 yrs ago   bilateral cataracts   FOOT OSTEOTOMY  6 weeks ago   Left foot: great, 2nd & 3rd   FOOT OSTEOTOMY  5 years ago   Right great toe   HAND SURGERY Bilateral 2011-most recent   multiple hand surgeries, 2 on left, 3 on  right   KNEE ARTHROPLASTY Right 04/28/2022   Procedure: COMPUTER ASSISTED TOTAL KNEE ARTHROPLASTY;  Surgeon: Donato Heinz, MD;  Location: [facility];  Service: Orthopedics;  Laterality: Right;   LOOP RECORDER INSERTION N/A 05/16/2020   Procedure: LOOP RECORDER INSERTION;  Surgeon: Marcina Millard, MD;  Location: [facility];  Service: Cardiovascular;  Laterality: N/A;   NASAL SINUS SURGERY  most recent 7-8 yrs ago   7 sinus surgeries    TRIGGER FINGER RELEASE  11/19/2011   Procedure: RELEASE TRIGGER FINGER/A-1 PULLEY;  Surgeon: Nicki Reaper, MD;  Location: [facility];  Service: Orthopedics;  Laterality: Right;  release a-1 pulley right index finger and cyst removal   WRIST GANGLION EXCISION  1980's   right   Patient Active Problem List   Diagnosis Date Noted   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 11/27/2022   Hypomagnesemia 11/27/2022   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   HLD (hyperlipidemia) 11/23/2022   Asthma 11/23/2022   Depression with anxiety 11/23/2022   Chronic diastolic CHF (  congestive heart failure) (HCC) 11/23/2022   Normocytic anemia 11/23/2022   Aspiration pneumonia (HCC) 11/23/2022   AKI (acute kidney injury) (HCC) 11/23/2022   Abdominal pain 11/23/2022   Mesenteric mass 11/23/2022   Coffee ground emesis 11/23/2022   Nausea and vomiting 10/15/2022   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   Fatigue 09/17/2022   Left hemiparesis (HCC) 09/17/2022   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HCC) 08/22/2022   GERD (gastroesophageal reflux disease) 08/21/2022   Agitation 08/20/2022   Acute left-sided weakness 08/20/2022   Expressive aphasia    Stroke (HCC) 08/19/2022   Leukocytosis 08/19/2022   History of urticaria 04/28/2022   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   Primary osteoarthritis of left knee 02/24/2022   Primary osteoarthritis of right knee 02/24/2022   Lumbar spondylolysis 04/12/2020   History of CVA  (cerebrovascular accident) 03/26/2020   Low back pain radiating to right lower extremity 03/21/2020   B12 deficiency 03/06/2020   Positive anti-CCP test 12/21/2019   Arthralgia 12/13/2019   Dermatitis 12/13/2019   Rheumatoid factor positive 12/13/2019   Essential hypertension 12/11/2018   Palpitations 12/11/2018   Acquired hypothyroidism 11/10/2018   Arthritis of knee 09/17/2016   Anxiety 11/22/2014   Asthma without status asthmaticus 11/22/2014   Benign neoplasm of colon, unspecified 11/22/2014   Environmental allergies 11/22/2014   Hypertriglyceridemia 11/22/2014   Hypokalemia 11/22/2014   Personal history of disease of skin and subcutaneous tissue 11/22/2014   REFERRING DIAG: CVA   THERAPY DIAG:  Muscle weakness (generalized)   Other lack of coordination   Rationale for Evaluation and Treatment Rehabilitation   SUBJECTIVE:   SUBJECTIVE STATEMENT:  Pt. Reports that her knee pain hindered her ability to get up, and walk this weekend.   Pt accompanied by: significant other   PERTINENT HISTORY: Patient is an 82 year-old female who was admitted to [facility] on 11/23/2022 with a cerebellar CVA. 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 Pt. Was admitted to Inpatient Rehab from 12/21-1/02/2023. 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  Patient underwent inpatient rehabilitation for 2 weeks.  Patient was assessed and was scheduled for a knee replacement on 08/29/2022 however had to cancel it due to having had a CVA.  Patient had a recent fall 2 days after discharging from inpatient rehab. Past Medical History includes: Knee replacement, essential HTN, hypokalemia, leukocytosis, seizures, positive anti-- CCP test, anxiety disorder, mini strokes.  Patient had shingles with left eye nerve pain s/p 1 year ago.    PRECAUTIONS: Fall   WEIGHT BEARING RESTRICTIONS No   PAIN:  Are you having pain?  No    FALLS: Has patient fallen in last 6 months? Yes. Number of falls 1   LIVING ENVIRONMENT: Lives with: Lives with Spouse Lives in: House/apartment Stairs: 2 storey home, resides on the first floor.  External: 2 stairs front no rails, and 6 in back with rails Has following equipment at home: Single point cane, Walker - 2 wheeled, Environmental consultant - 4 wheeled, Shower bench, and bed side commode   PLOF: Independent   PATIENT GOALS  To Regain the use of her left arm   OBJECTIVE:    HAND DOMINANCE: Right   ADLs: Overall ADLs: Husband assists pt. as needed Transfers/ambulation related to ADLs:Pt. Uses a 3 wheeled walker with Husband assist. Eating: Pt. Is independent with the right hand. Pt. has difficulty cutting food. Grooming: Pt. is using her right hand, however has difficulty sustaining her LUE in elevation to assist  with haircare. UB Dressing: Pt. Is independent donning a pullover shirt, and button down shirt. Has difficulty with buttoning, LB Dressing:  Independent donning pants, and socks. Difficulty tying shoes. Toileting: Independent Bathing: Pt. Is able to engage her right hand. Tub Shower transfers: Supervision Equipment: See above for equipment    IADLs: Shopping:  Has not had the opportunity for grocery shopping yet Light housekeeping: Husband is assisting with light house keeping Meal Prep:  Dependent Community mobility: Relies of family/friends Medication management: Husband assisting with weekly pillbox set-up, and administering medication. Financial management: TBD Handwriting: 75% legible   MOBILITY STATUS: Hx of falls   POSTURE COMMENTS:  No Significant postural limitations Sitting balance: supported sitting balance WFL   ACTIVITY TOLERANCE: Activity tolerance:  Fatigues in greater than 30 min.    FUNCTIONAL OUTCOME MEASURES: FOTO: 57   UPPER EXTREMITY ROM      Active ROM Right Eval: WFL Left eval Left  01/22/23 Left  03/03/23 Left  04/14/23 Left  05/28/2023  Left 07/23/2023  Shoulder flexion   132 100 108 108 108 60(70)  Shoulder abduction   80 85 85 85 85 65(78)  Shoulder adduction           Shoulder extension           Shoulder internal rotation           Shoulder external rotation           Elbow flexion   140 140 [employer] WFL [facility] WFL  Elbow extension   WNL WNL [facility] WFL WFL  Wrist flexion   65 68      Wrist extension   -10 20 24  32 32 -10  Wrist ulnar deviation     12 10 14 14 10   Wrist radial deviation     8 14 14 16 12   Wrist pronation           Wrist supination           (Blank rows = not tested)   Left digit flexion to [facility]: 2nd: 0cm, 3rd: 0cm, 4th: 0cm, 5th: 0cm   Limited Left full 2nd digit extension     UPPER EXTREMITY MMT:      MMT Right Eval: 4+/5 overall Right 07/23/2023 Left Eval Left 01/22/23 Left 03/03/23 Left  04/14/2023 Left 05/28/2023 Left 07/23/2023  Shoulder flexion   3+/5 3/5 3-/5 3-/5 3-/5 3-/5 2/5  Shoulder abduction   3+/5 3-/5 3-/5 3-/5 3-/5 3-/5 2/5  Shoulder adduction            Shoulder extension            Shoulder internal rotation            Shoulder external rotation            Middle trapezius            Lower trapezius            Elbow flexion   4/5 3+/5 4/5 N/A 4/5 4/5 3+/5  Elbow extension   4/5 3+/5 4/4 N/A 4/5 4/5 3+/5  Wrist flexion            Wrist extension   4/5 2-/5 3-/5 3-/5 3/5 3+/5 3+/5  Wrist ulnar deviation            Wrist radial deviation            Wrist pronation            Wrist supination            (  Blank rows = not tested)   HAND FUNCTION: Grip strength: Right: 26#, Left: 10# Pinch strength: Right 8#, Left: 3#, 3 Pt. Pinch strength: Right: 9#, L: 2#  01/22/2023 Grip strength: Right: 26#, Left: 12# Pinch strength:  Pinch meter used at the initial eval has been sent out for recalibration   03/03/2023 Grip strength: Right: 26#, Left: 13# Pinch strength:  Pinch meter used at the initial eval has been sent out for recalibration  04/14/2023: Grip strength: Right: 26#,  Left: 13# Pinch strength:   Right 8#, Left: 4#, 3 Pt. Pinch strength: Right: 9#, L: 3#    05/28/2023: Grip strength: Right: 26#, Left: 13# Pinch strength:   Right 8#, Left: 2#, 3 Pt. Pinch strength: Right: 9#, L: 4#  07/23/2023  Grip strength: Right: 5#, Left: 2# Pinch strength:   Right 6#, Left: 2#, 3 Pt. Pinch strength: Right: 5#, L: 2#    COORDINATION: Right: 22 sec., Left: <5 min. To place 7 pegs with increased compensation proximally in the trunk, and through reflexive associated reactions.  01/22/23 Right: 22 sec., Left: 3 min. & 4 sec.  03/03/23 Right: 22 sec., Left: 1 min. & 39 sec.  04/14/23   TBD  05/28/23 Right: 22 sec., Left:  5 min.  07/23/2023  Right: 33 sec., Left:  Pt. Is unable to grasp, and place pegs into the pegboard. Pt. Was able to remove 9 vertical pegs in 34 sec.    SENSATION: Light touch: WFL, proprioceptive awareness: Intact   EDEMA: N/A   MUSCLE TONE: LUE: Hypotonic   COGNITION: Overall cognitive status: WFL for tasks assessed. Pt. Is impulsive at times.   VISION: Subjective report: Pt. report having shingles affecting left eye  s/p 1 year. Has nerve pain Baseline vision: Wears glasses for reading only Visual history:  updated see clinical impression   VISION ASSESSMENT:    WFL for tasks performed   PERCEPTION: Intact   PRAXIS: Impaired: Motor planning   OBSERVATIONS:  Pt. more alert, and engaging since prior to the most recent hospitalization.   TODAY'S TREATMENT:    Contrast Bath:  Pt. tolerated alternating contrasting heat pack 3 min. X's cold pack 1 min. for a trial of 3 reps for a total of 12 min. 2/2 left hand/wrist edema.  There. Ex.:  Pt. Tolerated PROM/AAROM to the Left shoulder, elbow, wrist, and digits. Pt. Worked on BUE strengthening, and reciprocal motion using the UBE while seated for 8 min. with no resistance. Constant monitoring was provided.   Manual Therapy:  Pt. tolerated soft tissue massage to the left  scapular, and shoulder regions. Pt. Tolerated retrograde massage to the left wrist. Manual therapy was performed independent of, and in preparation for therapeutic Ex.            PATIENT EDUCATION: Education details: LUE therapeutic exercise Person educated: Patient and Spouse Education method: Medical illustrator Education comprehension: verbalized understanding, returned demonstration, and needs further education   HOME EXERCISE PROGRAM:   Reviewed activities at home to promote isolated 2nd digit extension.    GOALS: Goals reviewed with patient? Yes   SHORT TERM GOALS: Target date:  07/09/2023    1. Patient will be independent with home exercise program for the left upper extremity Baseline: 07/23/2023: Pt. continues to require assist. 05/28/2023: Pt. Requires assist 04/09/2023: Pt. Continues to consistently attempt to engage her hand at hand 02/01/2023: Pt. Consistently attempts to perform HEPs independently. No current home exercise program Goal status: Ongoing   LONG TERM GOALS:  Target date:  08/20/2023   1.  Patient will improve left shoulder strength by 2 mm grades to be able to sustain UE's in elevation long enough to wash her hair.  Baseline: 07/23/2023: Left shoulder flexion: 2/5, abduction: 2/5. 05/28/2023: Left shoulder flexion: 3-/5, abduction: 3-/5. Pt. has an old left shoulder injury limiting progression with left shoulder strength 05/26/2023: Pt. continues to present with limitiations in sustaining LUE elevation long enough to perform hair care thoroughly 04/09/2023: Pt. is limited with sustaining LUE elevation long enough to perform hair care thoroughly. 03/03/2023: Left shoulder flexion: 3-/5, abduction: 3-/5 01/22/2023: Left shoulder flexion: 3-/5, abduction: 3-/5 Eval: Left shoulder flexion: 3/5, abduction: 3-/5 Goal status: Revised, to continue   2.  Patient will improve left shoulder active abduction to be able to comb her hair Baseline: 07/23/2023: Left shoulder  flexion: 60(70) abduction: 65(78) 05/28/2023: left shoulder flexion: 108 abduction: 85 Pt. Has an old left shoulder injury limiting progression with left shoulder ROM 05/26/2023:  5/10 left shoulder pain with ROM limits using it functionally during hair care. 04/09/2023: Pt. Presents with difficulty abducting her left shoulder enough to thoroughly complete haircare. 03/03/2023: Shoulder abduction: 85 01/22/2023: Shoulder abduction: 85 Eval: Left shoulder abduction is 80(108) Goal status: Revised, to continue   3.  Patient will independently button her shirt with modified independence. Baseline: 07/23/2023 Pt. presents with difficulty buttoning her shirt. 05/28/2023: pt. Continues to work towards progressing with buttoning 05/26/2023:  Pt. Continues to progress towards buttoning. 04/09/2023: Pt. Continues to progress towards buttoning. 03/03/2023: Pt. Continues to have difficulty with buttoning. 01/22/2023: Pt. continues to have difficulty. Eval: Patient has difficulty.  Goal status: Ongoing   4.  Patient well improve left grip strength in preparation for securely holding flowers. Baseline: 07/23/2023: Grip strength: Right: 5#, Left: 2# 05/28/2023: Right: 26#, Left: 13#  pt. Presents with difficulty securely holding flowers in her left hand. 05/26/2023: TBD 04/09/2023: Pt. Is able to hold, and hike pants with the left hand, continues to have difficulty with securely holding flowers.   03/03/2023: left grip strength: 13# 01/22/2023: Left: 12# Eval: Pt. Is unable to securely hold flowers. Goal status: Ongoing   5.  Pt. will independently recall adaptive  strategies for performing ADL tasks including: flossing teeth, donning bra, applying makeup. Baseline: 07/23/2023: Continue 05/28/2023: Pt. Needs continued education about adaptive strategies.05/26/2023: Continue 04/09/2023: Continue3/26/2024: Continue 01/22/2023: Pt. continues to benefit from education about adaptive strategies during ADLs, and IADLs. Eval: Pt. to be provided  with adaptive strategies. Goal status: Ongoing   6.  Pt. will improve FOTO score by 2 points to reflect patient perceived performance improvement assessment specific ADLs  and IADLs Baseline: 5/36/6440: FOTO score: 57 05/28/2023: FOTO score 59 05/26/2023: TBD 5/02/204:  TBD 03/03/23: FOTO 62 Eval: 57 Goal status: Ongoing  7.  Pt. will improve left hand coordination skills in order to be able to handle, and sort utensils in a drawer.     Baseline: 07/23/2023: Right: 33 sec., Left:  Pt. Is unable to grasp, and place pegs into the pegboard. Pt. Was able to remove 9 vertical pegs in 34 sec. 05/28/2023: Left:  5 min. 05/26/2023 Pt. Continues to present with difficulty handling and sorting utensils. 04/14/2023: 56 04/09/2023: TBD 03/03/23: 1 min. & 39 01/22/2022: Left: 3 min. & 4 sec. Eval: Pt. has difficulty sorting, and placing utensils with the left hand. Left FMC : >5 min. For 7 pegs on the 9 hole peg test.    Goal Status:  Ongoing  8.  Pt. will improve active left 2nd digit extension to be able able to isolate her 2nd digit in preparation for pressing/pushing buttons on appliances, phones, or remotes. Baseline: 07/23/2023: Pt. Continues to work on consistency with isolating left 2nd digit extension 05/28/2023: Pt. Continues to improve with left 2nd digit extension 05/26/2023: Improving with left 2nd digit extension. 04/09/2023: Pt. Is progressing with isolating left 2nd digit extension, however continues to present with limited increased flexor tone. 03/03/23: Pt. Continues to work on improving consistency with 2nd digit extension to press the remote. 01/22/2023: is able to perform full digit extension, although 2nd digit is slow to extend s 2/2 flexor tone. Pt.  Eval: Pt. is able to is unable to actively perform full digit extension Goal status:  Ongoing   ASSESSMENT:   CLINICAL IMPRESSION:  Pt. reports that her LUE, and hand feel better.  Pt. tolerated ROM, massage, and contrast bath, UE there. Ex., and the UBE  today.  Pt./caregiver education was provided about positioning for shoulder abduction with a pillow at home for passive stretching in abduction. Pt. continues to benefit from skilled OT services to work on improving BUE strength, and coordination skills, as well as left shoulder, elbow, and wrist strength, improving motor control and coordination in order to prepare the left upper extremity and hand for functional reaching and engaging the upper left extremity when performing hair care, buttoning, holding flowers, cutting food, and manipulating utensils to set the table as well as adaptive strategies during daily IADL care.     PERFORMANCE DEFICITS in functional skills including ADLs, IADLs, coordination, proprioception, ROM, strength, FMC, and GMC, cognitive skills including memory, and psychosocial skills including coping strategies, environmental adaptation, interpersonal interactions, and routines and behaviors.    IMPAIRMENTS are limiting patient from ADLs, IADLs, education, leisure, and social participation.    COMORBIDITIES may have co-morbidities  that affects occupational performance. Patient will benefit from skilled OT to address above impairments and improve overall function.   MODIFICATION OR ASSISTANCE TO COMPLETE EVALUATION: Min-Moderate modification of tasks or assist with assess necessary to complete an evaluation.   OT OCCUPATIONAL PROFILE AND HISTORY: Detailed assessment: Review of records and additional review of physical, cognitive, psychosocial history related to current functional performance.   CLINICAL DECISION MAKING: Moderate - several treatment options, min-mod task modification necessary   REHAB POTENTIAL: Good   EVALUATION COMPLEXITY: Moderate      PLAN: OT FREQUENCY: 2x/week   OT DURATION: 12 weeks   PLANNED INTERVENTIONS: self care/ADL training, therapeutic exercise, therapeutic activity, neuromuscular re-education, manual therapy, passive range of motion,  functional mobility training, electrical stimulation, and paraffin   RECOMMENDED OTHER SERVICES: PT   CONSULTED AND AGREED WITH PLAN OF CARE: Patient and family member/caregiver   PLAN FOR NEXT SESSION: see above    Olegario Messier, MS, OTR/L  08/18/2023

## 2023-08-20 ENCOUNTER — Ambulatory Visit: Payer: Medicare HMO | Admitting: Occupational Therapy

## 2023-08-20 ENCOUNTER — Ambulatory Visit: Payer: Medicare HMO

## 2023-08-20 DIAGNOSIS — R262 Difficulty in walking, not elsewhere classified: Secondary | ICD-10-CM

## 2023-08-20 DIAGNOSIS — R278 Other lack of coordination: Secondary | ICD-10-CM

## 2023-08-20 DIAGNOSIS — R531 Weakness: Secondary | ICD-10-CM

## 2023-08-20 DIAGNOSIS — M6281 Muscle weakness (generalized): Secondary | ICD-10-CM

## 2023-08-20 DIAGNOSIS — R2681 Unsteadiness on feet: Secondary | ICD-10-CM | POA: Diagnosis not present

## 2023-08-20 NOTE — Therapy (Signed)
Occupational Therapy Treatment/Recertification Note  Patient Name: [name] MRN: [id number] DOB:06-24-1941, 82 y.o., female Today's Date: 08/20/2023  PCP: Dr. Judithann Sheen REFERRING PROVIDER:  Dr. Judithann Sheen  END OF SESSION:  PLAN:   OT End of Session - 08/20/23 1623     Visit Number [id number]    Number of Visits 96    Date for OT Re-Evaluation 11/12/23    OT Start Time 1625    OT Stop Time 1700    OT Time Calculation (min) 35 min    Equipment Utilized During Treatment Wheelchair    Activity Tolerance Patient tolerated treatment well    Behavior During Therapy WFL for tasks assessed/performed               Past Medical History:  Diagnosis Date   Anemia    Arthritis    Asthma    uses inhaler just prior to surgery to avoid attack   Back pain    from previous injury   Complication of anesthesia    has woken  up during 2 different surgery   Depression    no current issue/treatment; situation   Gallstones    GERD (gastroesophageal reflux disease)    Hiatal hernia    patient does NOT have nerve/muscle disease   History of kidney stones    HLD (hyperlipidemia)    HTN (hypertension)    Hypothyroidism    Kidney stones    Knee pain    Nausea and vomiting 10/15/2022   Non-diabetic pancreatic hormone dysfunction years   pt. 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   Pancreatitis    Pneumonia    Seizures (HCC)    caused by dye injected during a procedure   Shortness of breath    with exertion   Sinus problem    frequent infections/congestion   Stroke (HCC) 2021   reports having CVA in 2021 and having mini strokes before that   Thyroid disease    Past Surgical History:  Procedure Laterality Date   ABDOMINAL HYSTERECTOMY     APPENDECTOMY     CARPAL TUNNEL RELEASE  10+ years ago   bilateral   EYE SURGERY  3 yrs ago   bilateral cataracts   FOOT OSTEOTOMY  6 weeks ago   Left foot: great, 2nd & 3rd   FOOT OSTEOTOMY  5 years ago   Right great toe   HAND SURGERY  Bilateral 2011-most recent   multiple hand surgeries, 2 on left, 3 on right   KNEE ARTHROPLASTY Right 04/28/2022   Procedure: COMPUTER ASSISTED TOTAL KNEE ARTHROPLASTY;  Surgeon: Donato Heinz, MD;  Location: [facility];  Service: Orthopedics;  Laterality: Right;   LOOP RECORDER INSERTION N/A 05/16/2020   Procedure: LOOP RECORDER INSERTION;  Surgeon: Marcina Millard, MD;  Location: [facility];  Service: Cardiovascular;  Laterality: N/A;   NASAL SINUS SURGERY  most recent 7-8 yrs ago   7 sinus surgeries    TRIGGER FINGER RELEASE  11/19/2011   Procedure: RELEASE TRIGGER FINGER/A-1 PULLEY;  Surgeon: Nicki Reaper, MD;  Location: [facility];  Service: Orthopedics;  Laterality: Right;  release a-1 pulley right index finger and cyst removal   WRIST GANGLION EXCISION  1980's   right   Patient Active Problem List   Diagnosis Date Noted   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 11/27/2022   Hypomagnesemia 11/27/2022   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   HLD (hyperlipidemia) 11/23/2022   Asthma 11/23/2022   Depression  with anxiety 11/23/2022   Chronic diastolic CHF (congestive heart failure) (HCC) 11/23/2022   Normocytic anemia 11/23/2022   Aspiration pneumonia (HCC) 11/23/2022   AKI (acute kidney injury) (HCC) 11/23/2022   Abdominal pain 11/23/2022   Mesenteric mass 11/23/2022   Coffee ground emesis 11/23/2022   Nausea and vomiting 10/15/2022   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   Fatigue 09/17/2022   Left hemiparesis (HCC) 09/17/2022   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HCC) 08/22/2022   GERD (gastroesophageal reflux disease) 08/21/2022   Agitation 08/20/2022   Acute left-sided weakness 08/20/2022   Expressive aphasia    Stroke (HCC) 08/19/2022   Leukocytosis 08/19/2022   History of urticaria 04/28/2022   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   Primary osteoarthritis of left knee 02/24/2022   Primary osteoarthritis of right knee  02/24/2022   Lumbar spondylolysis 04/12/2020   History of CVA (cerebrovascular accident) 03/26/2020   Low back pain radiating to right lower extremity 03/21/2020   B12 deficiency 03/06/2020   Positive anti-CCP test 12/21/2019   Arthralgia 12/13/2019   Dermatitis 12/13/2019   Rheumatoid factor positive 12/13/2019   Essential hypertension 12/11/2018   Palpitations 12/11/2018   Acquired hypothyroidism 11/10/2018   Arthritis of knee 09/17/2016   Anxiety 11/22/2014   Asthma without status asthmaticus 11/22/2014   Benign neoplasm of colon, unspecified 11/22/2014   Environmental allergies 11/22/2014   Hypertriglyceridemia 11/22/2014   Hypokalemia 11/22/2014   Personal history of disease of skin and subcutaneous tissue 11/22/2014   REFERRING DIAG: CVA   THERAPY DIAG:  Muscle weakness (generalized)   Other lack of coordination   Rationale for Evaluation and Treatment Rehabilitation   SUBJECTIVE:   SUBJECTIVE STATEMENT:  Pt. Reports feeling better  Pt accompanied by: significant other   PERTINENT HISTORY: Patient is an 82 year-old female who was admitted to [facility] on 11/23/2022 with a cerebellar CVA. 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 Pt. Was admitted to Inpatient Rehab from 12/21-1/02/2023. 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  Patient underwent inpatient rehabilitation for 2 weeks.  Patient was assessed and was scheduled for a knee replacement on 08/29/2022 however had to cancel it due to having had a CVA.  Patient had a recent fall 2 days after discharging from inpatient rehab. Past Medical History includes: Knee replacement, essential HTN, hypokalemia, leukocytosis, seizures, positive anti-- CCP test, anxiety disorder, mini strokes.  Patient had shingles with left eye nerve pain s/p 1 year ago.    PRECAUTIONS: Fall   WEIGHT BEARING RESTRICTIONS No   PAIN:  Are you having pain?   No   FALLS: Has patient fallen in last 6 months? Yes. Number of falls 1   LIVING ENVIRONMENT: Lives with: Lives with Spouse Lives in: House/apartment Stairs: 2 storey home, resides on the first floor.  External: 2 stairs front no rails, and 6 in back with rails Has following equipment at home: Single point cane, Walker - 2 wheeled, Environmental consultant - 4 wheeled, Shower bench, and bed side commode   PLOF: Independent   PATIENT GOALS  To Regain the use of her left arm   OBJECTIVE:    HAND DOMINANCE: Right   ADLs: Overall ADLs: Husband assists pt. as needed Transfers/ambulation related to ADLs:Pt. Uses a 3 wheeled walker with Husband assist. Eating: Pt. Is independent with the right hand. Pt. has difficulty cutting food. Grooming: Pt. is using her right hand, however has difficulty sustaining her LUE in elevation to assist with haircare. UB Dressing: Pt.  Is independent donning a pullover shirt, and button down shirt. Has difficulty with buttoning, LB Dressing:  Independent donning pants, and socks. Difficulty tying shoes. Toileting: Independent Bathing: Pt. Is able to engage her right hand. Tub Shower transfers: Supervision Equipment: See above for equipment    IADLs: Shopping:  Has not had the opportunity for grocery shopping yet Light housekeeping: Husband is assisting with light house keeping Meal Prep:  Dependent Community mobility: Relies of family/friends Medication management: Husband assisting with weekly pillbox set-up, and administering medication. Financial management: TBD Handwriting: 75% legible   MOBILITY STATUS: Hx of falls   POSTURE COMMENTS:  No Significant postural limitations Sitting balance: supported sitting balance WFL   ACTIVITY TOLERANCE: Activity tolerance:  Fatigues in greater than 30 min.    FUNCTIONAL OUTCOME MEASURES: FOTO: 57   UPPER EXTREMITY ROM      Active ROM Right Eval: WFL Left eval Left  01/22/23 Left  03/03/23 Left  04/14/23 Left   05/28/2023 Left 07/23/2023 Left 08/20/2023  Shoulder flexion   132 100 108 108 108 60(70) 70(78)  Shoulder abduction   80 85 85 85 85 65(78) 72(85)  Shoulder adduction            Shoulder extension            Shoulder internal rotation            Shoulder external rotation            Elbow flexion   140 140 WFL [facility] WFL  Elbow extension   WNL WNL [facility] WFL WFL  Wrist flexion   65 68       Wrist extension   -10 20 24  32 32 -10 20  Wrist ulnar deviation     12 10 14 14 10 18   Wrist radial deviation     8 14 14 16 12 14   Wrist pronation            Wrist supination            (Blank rows = not tested)   Left digit flexion to [facility]: 2nd: 0cm, 3rd: 0cm, 4th: 0cm, 5th: 0cm   Limited Left full 2nd digit extension     UPPER EXTREMITY MMT:      MMT Right Eval: 4+/5 overall Right 07/23/2023 Left Eval Left 01/22/23 Left 03/03/23 Left  04/14/2023 Left 05/28/2023 Left 07/23/2023 Left 08/20/2023  Shoulder flexion   3+/5 3/5 3-/5 3-/5 3-/5 3-/5 2/5 2+/5  Shoulder abduction   3+/5 3-/5 3-/5 3-/5 3-/5 3-/5 2/5 2+/5  Shoulder adduction             Shoulder extension             Shoulder internal rotation             Shoulder external rotation             Middle trapezius             Lower trapezius             Elbow flexion   4/5 3+/5 4/5 N/A 4/5 4/5 3+/5 3+/5  Elbow extension   4/5 3+/5 4/4 N/A 4/5 4/5 3+/5 3+/5  Wrist flexion             Wrist extension   4/5 2-/5 3-/5 3-/5 3/5 3+/5 3-/5 3+/5  Wrist ulnar deviation             Wrist radial deviation  Wrist pronation             Wrist supination             (Blank rows = not tested)   HAND FUNCTION: Grip strength: Right: 26#, Left: 10# Pinch strength: Right 8#, Left: 3#, 3 Pt. Pinch strength: Right: 9#, L: 2#  01/22/2023 Grip strength: Right: 26#, Left: 12# Pinch strength:  Pinch meter used at the initial eval has been sent out for recalibration   03/03/2023 Grip strength: Right: 26#, Left: 13# Pinch strength:   Pinch meter used at the initial eval has been sent out for recalibration  04/14/2023: Grip strength: Right: 26#, Left: 13# Pinch strength:   Right 8#, Left: 4#, 3 Pt. Pinch strength: Right: 9#, L: 3#    05/28/2023: Grip strength: Right: 26#, Left: 13# Pinch strength:   Right 8#, Left: 2#, 3 Pt. Pinch strength: Right: 9#, L: 4#  07/23/2023  Grip strength: Right: 5#, Left: 2# Pinch strength:   Right 6#, Left: 2#, 3 Pt. Pinch strength: Right: 5#, L: 2#  08/20/2023:    Grip strength: Right: 8#, Left: 4# Pinch strength:   Right 6#, Left: 2#, 3 Pt. Pinch strength: Right: 5#, L: 2#       COORDINATION: Right: 22 sec., Left: <5 min. To place 7 pegs with increased compensation proximally in the trunk, and through reflexive associated reactions.  01/22/23 Right: 22 sec., Left: 3 min. & 4 sec.  03/03/23 Right: 22 sec., Left: 1 min. & 39 sec.  04/14/23   TBD  05/28/23 Right: 22 sec., Left:  5 min.  07/23/2023  Right: 33 sec., Left:  Pt. Is unable to grasp, and place pegs into the pegboard. Pt. Was able to remove 9 vertical pegs in 34 sec.  08/20/2023:  Right: 27 sec., Left:  5 pegs placed in 5 min.; Pt. Was able to remove 9 vertical pegs in 28 sec.    SENSATION: Light touch: WFL, proprioceptive awareness: Intact   EDEMA: N/A   MUSCLE TONE: LUE: Hypotonic   COGNITION: Overall cognitive status: WFL for tasks assessed. Pt. Is impulsive at times.   VISION: Subjective report: Pt. report having shingles affecting left eye  s/p 1 year. Has nerve pain Baseline vision: Wears glasses for reading only Visual history:  updated see clinical impression   VISION ASSESSMENT:    WFL for tasks performed   PERCEPTION: Intact   PRAXIS: Impaired: Motor planning   OBSERVATIONS:  Pt. more alert, and engaging since prior to the most recent hospitalization.   TODAY'S TREATMENT:    Measurements were obtained, and goals were reviewed with the Pt.           PATIENT EDUCATION: Education  details: LUE therapeutic exercise Person educated: Patient and Spouse Education method: Medical illustrator Education comprehension: verbalized understanding, returned demonstration, and needs further education   HOME EXERCISE PROGRAM:   Reviewed activities at home to promote isolated 2nd digit extension.    GOALS: Goals reviewed with patient? Yes   SHORT TERM GOALS: Target date: 10/01/2023       1. Patient will be independent with home exercise program for the left upper extremity Baseline: 08/20/23: Pt. continues to require assist from he husband for HEPs 07/23/2023: Pt. continues to require assist. 05/28/2023: Pt. Requires assist 04/09/2023: Pt. Continues to consistently attempt to engage her hand at hand 02/01/2023: Pt. Consistently attempts to perform HEPs independently. No current home exercise program Goal status: Ongoing   LONG TERM GOALS: Target date:  08/20/2023   1.  Patient will improve left shoulder strength by 2 mm grades to be able to sustain UE's in elevation long enough to wash her hair.  Baseline: 08/20/2023: Left shoulder flexion 2+/5, abduction 2+/5  07/23/2023: Left shoulder flexion: 2/5, abduction: 2/5. 05/28/2023: Left shoulder flexion: 3-/5, abduction: 3-/5. Pt. has an old left shoulder injury limiting progression with left shoulder strength 05/26/2023: Pt. continues to present with limitiations in sustaining LUE elevation long enough to perform hair care thoroughly 04/09/2023: Pt. is limited with sustaining LUE elevation long enough to perform hair care thoroughly. 03/03/2023: Left shoulder flexion: 3-/5, abduction: 3-/5 01/22/2023: Left shoulder flexion: 3-/5, abduction: 3-/5 Eval: Left shoulder flexion: 3/5, abduction: 3-/5 Goal status: Improving, ongoing   2.  Patient will improve left shoulder active abduction to be able to comb her hair Baseline: 9/11/09/2023: 72(85) Pt. is able to reach her left side, and back of hair. 07/23/2023: Left shoulder flexion: 60(70)  abduction: 65(78) 05/28/2023: left shoulder flexion: 108 abduction: 85 Pt. Has an old left shoulder injury limiting progression with left shoulder ROM 05/26/2023:  5/10 left shoulder pain with ROM limits using it functionally during hair care. 04/09/2023: Pt. Presents with difficulty abducting her left shoulder enough to thoroughly complete haircare. 03/03/2023: Shoulder abduction: 85 01/22/2023: Shoulder abduction: 85 Eval: Left shoulder abduction is 80(108) Goal status: Improving, Ongoing   3.  Patient will independently button her shirt with modified independence. Baseline: 08/20/2023: Pt. requires increased time to complete, and assist from her husband. 07/23/2023 Pt. presents with difficulty buttoning her shirt. 05/28/2023: pt. Continues to work towards progressing with buttoning 05/26/2023:  Pt. Continues to progress towards buttoning. 04/09/2023: Pt. Continues to progress towards buttoning. 03/03/2023: Pt. Continues to have difficulty with buttoning. 01/22/2023: Pt. continues to have difficulty. Eval: Patient has difficulty.  Goal status: Ongoing   4.  Patient well improve left grip strength in preparation for securely hold a glass/beverage Baseline: 08/20/2023: Right: 8#, Left: 4# Pt. Is able to more securely hold mirrors, and flowers with the left hand, however has difficulty holding a glass beverage, or bottle. 07/23/2023: Grip strength: Right: 5#, Left: 2# 05/28/2023: Right: 26#, Left: 13#  pt. Presents with difficulty securely holding flowers in her left hand. 05/26/2023: TBD 04/09/2023: Pt. Is able to hold, and hike pants with the left hand, continues to have difficulty with securely holding flowers.   03/03/2023: left grip strength: 13# 01/22/2023: Left: 12# Eval: Pt. Is unable to securely hold flowers. Goal status: Improving, revised  to securely holding a glass   5.  Pt. will independently recall adaptive  strategies for performing ADL/ADL kitchen tasks including:  Baseline: 08/20/2023: Revised to include  IADL kitchen tasks.  07/23/2023: Continue 05/28/2023: Pt. Needs continued education about adaptive strategies.05/26/2023: Continue 04/09/2023: Continue3/26/2024: Continue 01/22/2023: Pt. continues to benefit from education about adaptive strategies during ADLs, and IADLs. Eval: Pt. to be provided with adaptive strategies. Goal status: Revised   6.  Pt. will improve FOTO score by 2 points to reflect patient perceived performance improvement assessment specific ADLs  and IADLs Baseline: 9/52/8413: FOTO score: 57 05/28/2023: FOTO score 59 05/26/2023: TBD 5/02/204:  TBD 03/03/23: FOTO 62 Eval: 57 Goal status: Ongoing  7.  Pt. will improve left hand coordination skills in order to be able to  manipulate small objects.     Baseline: 08/20/2023: Right: 27 sec., Left:  5 pegs placed in 5 min.; Pt. Was able to remove 9 vertical pegs in 28 sec. Pt. Is able to hold, and  sort utensils in a drawer. Pt. Has difficulty manipulating small objects. 07/23/2023: Right: 33 sec., Left:  Pt. Is unable to grasp, and place pegs into the pegboard. Pt. Was able to remove 9 vertical pegs in 34 sec. 05/28/2023: Left:  5 min. 05/26/2023 Pt. Continues to present with difficulty handling and sorting utensils. 04/14/2023: 56 04/09/2023: TBD 03/03/23: 1 min. & 39 01/22/2022: Left: 3 min. & 4 sec. Eval: Pt. has difficulty sorting, and placing utensils with the left hand. Left FMC : >5 min. For 7 pegs on the 9 hole peg test.    Goal Status: Improving, Revised to be able to improve left hand FMC to be able to manipulate  small objects   8. Pt. will improve active left 2nd digit extension to be able able to isolate her 2nd digit in preparation for pressing/pushing buttons on appliances, phones, or remotes. Baseline: 08/20/2023: Pt. Is more consistently able to isolate 2nd digit extension with increased time. 07/23/2023: Pt. Continues to work on consistency with isolating left 2nd digit extension 05/28/2023: Pt. Continues to improve with left 2nd digit  extension 05/26/2023: Improving with left 2nd digit extension. 04/09/2023: Pt. Is progressing with isolating left 2nd digit extension, however continues to present with limited increased flexor tone. 03/03/23: Pt. Continues to work on improving consistency with 2nd digit extension to press the remote. 01/22/2023: is able to perform full digit extension, although 2nd digit is slow to extend s 2/2 flexor tone. Pt.  Eval: Pt. is able to is unable to actively perform full digit extension Goal status: Improving Ongoing  9: Pt. Will cut food with modified independence    Baseline: 08/20/2023: Pt. Is unable to cut the food on her plate, as well as cut food for meal preparation.    Goal Status: New   ASSESSMENT:   CLINICAL IMPRESSION:  Pt. has started feeling better, and is now able to  engage her left hand more during daily tasks. Pt.'s LUE shoulder, and wrist ROM has improved, as well as bilateral grip strength, and FMC skills. Pt. has improved with using her left hand to assist with hair care, holding and sorting utensils, and securely holding flowers. Pt. Continues to work on improving LUE ROM, strength, hand function, and FMC skills in order to be able to use her LUE consistently, and efficiently for cutting food, meal prep, and hair care. Pt. continues to benefit from skilled OT services to work on improving BUE strength, and coordination skills, as well as left shoulder, elbow, and wrist strength, improving motor control and coordination in order to prepare the left upper extremity and hand for functional reaching and engaging the upper left extremity when performing hair care, buttoning, holding flowers, cutting food, and manipulating utensils to set the table as well as adaptive strategies during daily IADL care.     PERFORMANCE DEFICITS in functional skills including ADLs, IADLs, coordination, proprioception, ROM, strength, FMC, and GMC, cognitive skills including memory, and psychosocial skills including  coping strategies, environmental adaptation, interpersonal interactions, and routines and behaviors.    IMPAIRMENTS are limiting patient from ADLs, IADLs, education, leisure, and social participation.    COMORBIDITIES may have co-morbidities  that affects occupational performance. Patient will benefit from skilled OT to address above impairments and improve overall function.   MODIFICATION OR ASSISTANCE TO COMPLETE EVALUATION: Min-Moderate modification of tasks or assist with assess necessary to complete an evaluation.   OT OCCUPATIONAL PROFILE AND HISTORY: Detailed assessment: Review of records and additional review of physical, cognitive, psychosocial  history related to current functional performance.   CLINICAL DECISION MAKING: Moderate - several treatment options, min-mod task modification necessary   REHAB POTENTIAL: Good   EVALUATION COMPLEXITY: Moderate      PLAN: OT FREQUENCY: 2x/week   OT DURATION: 12 weeks   PLANNED INTERVENTIONS: self care/ADL training, therapeutic exercise, therapeutic activity, neuromuscular re-education, manual therapy, passive range of motion, functional mobility training, electrical stimulation, and paraffin   RECOMMENDED OTHER SERVICES: PT   CONSULTED AND AGREED WITH PLAN OF CARE: Patient and family member/caregiver   PLAN FOR NEXT SESSION: see above    Olegario Messier, MS, OTR/L  08/20/2023

## 2023-08-20 NOTE — Therapy (Signed)
OUTPATIENT PHYSICAL THERAPY Re-Evaluation   Patient Name: [name] MRN: [id number] DOB:[birth year], 82 y.o., female Today's Date: 08/20/2023   PCP: Aram Beecham, D MD REFERRING PROVIDER: Jacquelynn Cree PA   END OF SESSION:  PT End of Session - 08/20/23 1715     Visit Number 1    Number of Visits 16    Date for PT Re-Evaluation 10/20/23    Authorization Type [employer]:    Authorization Time Period 24 visits approved 04/14/23-09/25/23    Authorization - Visit Number 17    Authorization - Number of Visits 24    Progress Note Due on Visit 10    PT Start Time 1535    PT Stop Time 1620    PT Time Calculation (min) 45 min    Equipment Utilized During Treatment Gait belt    Activity Tolerance Patient tolerated treatment well;No increased pain    Behavior During Therapy WFL for tasks assessed/performed                Past Medical History:  Diagnosis Date   Anemia    Arthritis    Asthma    uses inhaler just prior to surgery to avoid attack   Back pain    from previous injury   Complication of anesthesia    has woken  up during 2 different surgery   Depression    no current issue/treatment; situation   Gallstones    GERD (gastroesophageal reflux disease)    Hiatal hernia    patient does NOT have nerve/muscle disease   History of kidney stones    HLD (hyperlipidemia)    HTN (hypertension)    Hypothyroidism    Kidney stones    Knee pain    Nausea and vomiting 10/15/2022   Non-diabetic pancreatic hormone dysfunction years   pt. 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   Pancreatitis    Pneumonia    Seizures (HCC)    caused by dye injected during a procedure   Shortness of breath    with exertion   Sinus problem    frequent infections/congestion   Stroke (HCC) 2021   reports having CVA in 2021 and having mini strokes before that   Thyroid disease    Past Surgical History:  Procedure Laterality Date   ABDOMINAL HYSTERECTOMY     APPENDECTOMY      CARPAL TUNNEL RELEASE  10+ years ago   bilateral   EYE SURGERY  3 yrs ago   bilateral cataracts   FOOT OSTEOTOMY  6 weeks ago   Left foot: great, 2nd & 3rd   FOOT OSTEOTOMY  5 years ago   Right great toe   HAND SURGERY Bilateral 2011-most recent   multiple hand surgeries, 2 on left, 3 on right   KNEE ARTHROPLASTY Right 04/28/2022   Procedure: COMPUTER ASSISTED TOTAL KNEE ARTHROPLASTY;  Surgeon: Donato Heinz, MD;  Location: [facility];  Service: Orthopedics;  Laterality: Right;   LOOP RECORDER INSERTION N/A 05/16/2020   Procedure: LOOP RECORDER INSERTION;  Surgeon: Marcina Millard, MD;  Location: [facility];  Service: Cardiovascular;  Laterality: N/A;   NASAL SINUS SURGERY  most recent 7-8 yrs ago   7 sinus surgeries    TRIGGER FINGER RELEASE  11/19/2011   Procedure: RELEASE TRIGGER FINGER/A-1 PULLEY;  Surgeon: Nicki Reaper, MD;  Location: [facility];  Service: Orthopedics;  Laterality: Right;  release a-1 pulley right index finger and cyst removal   WRIST GANGLION  EXCISION  1980's   right     ONSET DATE: 11/23/22  REFERRING DIAG: TIA  THERAPY DIAG:  Muscle weakness (generalized)  Other lack of coordination  Difficulty in walking, not elsewhere classified  Unsteadiness on feet  Acute left-sided weakness  Rationale for Evaluation and Treatment: Rehabilitation  SUBJECTIVE:                                                                                                                                                                                             SUBJECTIVE STATEMENT:  Pt reports continued limitations from left knee. She continues to wear knee brace, but it no longer appears to help with pain as it did previously.     Pt accompanied by: significant other  PERTINENT HISTORY: [name] is an 11 year-old female who was admitted to [facility] on 11/23/2022 with a cerebellar CVA. 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 Pt. Was admitted to Inpatient Rehab from 12/21-1/02/2023. 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  Patient underwent inpatient rehabilitation for 2 weeks.  Patient was assessed and was scheduled for a knee replacement on 08/29/2022 however had to cancel it due to having had a CVA. PMH includes: anemia, arthritis, back pain, depression, hernia, GERD, HLD, HTN, hypothyroidism, knee pain, pancreatic hormone dysfunction, pneumonia, seizures, stroke, thyroid disease.  Patient has a donjoy brace for L knee.   PAIN:  Are you having pain? Yes, only with end range flexion and STS.    PRECAUTIONS: Fall, has latex allergy  WEIGHT BEARING RESTRICTIONS: No  FALLS: Has patient fallen in last 6 months? Yes. Number of falls 4 falls   LIVING ENVIRONMENT: Lives with: lives with their family and lives with their spouse Lives in: House/apartment Stairs: Yes: Internal: flight steps;   and External: 2 steps; on right going up and on left going up Has following equipment at home: Single point cane, Walker - 2 wheeled, Shower bench, and bed side commode  PLOF: Independent  PATIENT GOALS: to have less pain and move better  OBJECTIVE:   TODAY'S TREATMENT:  DATE: 08/20/23 -SPT from transport chair to Nustep, no device, CGA  -P/ROM to A/ROM of BLE only on Nustep, level 1, seat 3; 3'45sec Improved tolerance or Left knee ROM over time, inititally painful with end range flexion -STS xfer c pedRW from elevated Nustep seat, the step pivot to transport chair -Step piovt transfer from transport chair to vestib table, YRW, minA at pelvis -seated to supine, supervision level  *set up for exercises  -Rt SAQ 1x10 A/ROM, no resistance  -Rt SAQ 1x10x3secH 2lb AW (excellent loading tolerance)  -RLE knee/hip flexion 1x15 paired  with manually resisted hip/knee extension 1x15 (manual leg press)  -LLE A/ROM hip+knee fleixon (hooklying marching)  -Rt SAQ 1x10x3secH 2lb AW   -supine to seated EOB MinA (dizziness)  -stand pivot c YRW table to transport chair with modA stabilization and cues for pt to bring COM over BOS to arrest retropulsion    PATIENT EDUCATION: Education details: Explain that Left knee seems to be progressively worsening, halting progress; offered an alternative with a focus on other limbs and decreasing stimulation of LLE.   HOME EXERCISE PROGRAM:  Pending visit 2   GOALS: Goals reviewed with patient? Yes  SHORT TERM GOALS: Target date: 09/19/23  Patient will be independent in starter HEP to improve A/ROM tolerance and preserve available ROM of LLE and to advance A/ROM and strength of RLE.  Baseline: 08/20/23: defer to vists 2-3  Goal status: NEW  LONG TERM GOALS: Target date: 10/20/23  Patient will increase FOTO score to equal to or greater than 65%  to demonstrate statistically significant improvement in ease of mobility.  Baseline:  47%; 06/23/23: 60;   7/16: 60%  Goal status: NEW   2.  Patient (> 60 years old) will complete five times sit to stand test in < 15 seconds indicating an increased RLE power.  Baseline: 5/7: unable to tolerate ; 7/16: 17.4sec Goal status: NEW  3.   Patient to report tolerance of ad lib AMB in home during the day of distances >51ft >5x at supervision level or better to improve independence with ADL.    Baseline: 08/20/23: using transport chair for nearly all mobility, not tolerating walking due to WB limitations of LLE  Goal status: NEW    ASSESSMENT:  CLINICAL IMPRESSION:   Pt continues to express feelings of frustration with continued progressive loss of mobility and decline in tolerance to Left knee use over past few months. 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 Moreover, the decline in mobility has likely resulted in negative consequences to pt's Right knee which is also now beginning to show decline in activity tolerance. After lengthy discussion with patient and husband, it is determined that POC will take a change of direction and return focus to maximizing mobility potential with transfers and limited household distance AMB through interventions aimed at maximizing compensatory gait patterns which would detract from any frank limitations imposed by lack of functional tolerance on LLE. Pt will need help developing strength in arms (remains weak since stroke and deconditioning) to maximize heavier weight bearing through a walker, and help maximizing strength in RLE which will be doing the majority of propulsion for gait. Interventions t to reduce stiffness in LLE and allow for TTWB to [facility] will be utilized, but not to a degree where time is detracted from interventions aimed at other 3 limbs. Anticipate cardiorespiratory limitations once  strength progresses, however, these limitations will be targetted sequentially as appropriate and relevant. The patient will benefit from skilled physical therapy to reduce pain, improve mobility, improve strength and improve quality of life.     OBJECTIVE IMPAIRMENTS: Abnormal gait, cardiopulmonary status limiting activity, decreased activity tolerance, decreased balance, decreased cognition, decreased endurance, decreased knowledge of use of DME, decreased mobility, difficulty walking, decreased ROM, decreased strength, decreased safety awareness, hypomobility, increased fascial restrictions, impaired perceived functional ability, impaired flexibility, impaired UE functional use, improper body mechanics, postural dysfunction, and pain.   ACTIVITY LIMITATIONS: carrying, lifting, bending, sitting, standing, squatting, stairs, transfers, bed mobility, bathing, toileting, dressing, reach over  head, hygiene/grooming, locomotion level, and caring for others  PARTICIPATION LIMITATIONS: meal prep, cleaning, laundry, medication management, personal finances, interpersonal relationship, driving, shopping, community activity, yard work, school, and church  PERSONAL FACTORS: Age, Behavior pattern, Education, Fitness, Past/current experiences, Time since onset of injury/illness/exacerbation, and 3+ comorbidities: anemia, arthritis, back pain, depression, hernia, GERD, HLD, HTN, hypothyroidism, knee pain, pancreatic hormone dysfunction, pneumonia, seizures, stroke, thyroid disease  are also affecting patient's functional outcome.   REHAB POTENTIAL: Fair    CLINICAL DECISION MAKING: Evolving/moderate complexity  EVALUATION COMPLEXITY: Moderate  PLAN:  PT FREQUENCY: 1-2x/week  PT DURATION: 12 weeks  PLANNED INTERVENTIONS: Therapeutic exercises, Therapeutic activity, Neuromuscular re-education, Balance training, Gait training, Patient/Family education, Self Care, Joint mobilization, Joint manipulation, Stair training, Vestibular training, Canalith repositioning, Visual/preceptual remediation/compensation, Orthotic/Fit training, DME instructions, Electrical stimulation, Wheelchair mobility training, Spinal mobilization, Cryotherapy, Moist heat, Compression bandaging, scar mobilization, Splintting, Taping, Traction, Ultrasound, Manual therapy, and Re-evaluation  PLAN FOR NEXT SESSION: Shifting focus away from direct Left knee interventions which have not been successful and bring focus to maximizing strength and functional compensation of RLE and BUE for modified household AMB and transfers with Marcelline Deist, PT, DPT 08/20/2023, 4:32 PM   5:21 PM, 08/20/23 [name], PT, DPT Physical Therapist - [facility]  [phone] [facility])

## 2023-08-25 ENCOUNTER — Ambulatory Visit: Payer: Medicare HMO | Admitting: Physical Therapy

## 2023-08-25 ENCOUNTER — Ambulatory Visit: Payer: Medicare HMO | Admitting: Occupational Therapy

## 2023-08-27 ENCOUNTER — Ambulatory Visit: Payer: Medicare HMO

## 2023-08-27 ENCOUNTER — Ambulatory Visit: Payer: Medicare HMO | Admitting: Occupational Therapy

## 2023-09-01 ENCOUNTER — Ambulatory Visit: Payer: Medicare HMO | Admitting: Physical Therapy

## 2023-09-01 ENCOUNTER — Ambulatory Visit: Payer: Medicare HMO | Admitting: Occupational Therapy

## 2023-09-03 ENCOUNTER — Ambulatory Visit: Payer: Medicare HMO | Admitting: Occupational Therapy

## 2023-09-03 ENCOUNTER — Ambulatory Visit: Payer: Medicare HMO

## 2023-09-03 DIAGNOSIS — R2681 Unsteadiness on feet: Secondary | ICD-10-CM

## 2023-09-03 DIAGNOSIS — M6281 Muscle weakness (generalized): Secondary | ICD-10-CM | POA: Diagnosis not present

## 2023-09-03 DIAGNOSIS — R278 Other lack of coordination: Secondary | ICD-10-CM | POA: Diagnosis not present

## 2023-09-03 DIAGNOSIS — R262 Difficulty in walking, not elsewhere classified: Secondary | ICD-10-CM | POA: Diagnosis not present

## 2023-09-03 DIAGNOSIS — R531 Weakness: Secondary | ICD-10-CM | POA: Diagnosis not present

## 2023-09-03 NOTE — Therapy (Signed)
OUTPATIENT PHYSICAL THERAPY TREATMENT   Patient Name: [name] MRN: [id number] DOB:07/20/1941, 82 y.o., female Today's Date: 09/03/2023   PCP: [name], D MD REFERRING PROVIDER: Jacquelynn Cree PA   END OF SESSION:  PT End of Session - 09/03/23 1708     Visit Number 2    Number of Visits 16    Date for PT Re-Evaluation 10/20/23    Authorization Type [employer]:    Authorization Time Period 24 visits approved 04/14/23-09/25/23    Authorization - Number of Visits 24    Progress Note Due on Visit 10    PT Start Time 1536    PT Stop Time 1600    PT Time Calculation (min) 24 min    Equipment Utilized During Treatment Gait belt    Activity Tolerance Patient tolerated treatment well;Other (comment)   nausea   Behavior During Therapy WFL for tasks assessed/performed                 Past Medical History:  Diagnosis Date   Anemia    Arthritis    Asthma    uses inhaler just prior to surgery to avoid attack   Back pain    from previous injury   Complication of anesthesia    has woken  up during 2 different surgery   Depression    no current issue/treatment; situation   Gallstones    GERD (gastroesophageal reflux disease)    Hiatal hernia    patient does NOT have nerve/muscle disease   History of kidney stones    HLD (hyperlipidemia)    HTN (hypertension)    Hypothyroidism    Kidney stones    Knee pain    Nausea and vomiting 10/15/2022   Non-diabetic pancreatic hormone dysfunction years   pt. 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   Pancreatitis    Pneumonia    Seizures (HCC)    caused by dye injected during a procedure   Shortness of breath    with exertion   Sinus problem    frequent infections/congestion   Stroke (HCC) 2021   reports having CVA in 2021 and having mini strokes before that   Thyroid disease    Past Surgical History:  Procedure Laterality Date   ABDOMINAL HYSTERECTOMY     APPENDECTOMY     CARPAL TUNNEL RELEASE  10+  years ago   bilateral   EYE SURGERY  3 yrs ago   bilateral cataracts   FOOT OSTEOTOMY  6 weeks ago   Left foot: great, 2nd & 3rd   FOOT OSTEOTOMY  5 years ago   Right great toe   HAND SURGERY Bilateral 2011-most recent   multiple hand surgeries, 2 on left, 3 on right   KNEE ARTHROPLASTY Right 04/28/2022   Procedure: COMPUTER ASSISTED TOTAL KNEE ARTHROPLASTY;  Surgeon: Donato Heinz, MD;  Location: [facility];  Service: Orthopedics;  Laterality: Right;   LOOP RECORDER INSERTION N/A 05/16/2020   Procedure: LOOP RECORDER INSERTION;  Surgeon: Marcina Millard, MD;  Location: [facility];  Service: Cardiovascular;  Laterality: N/A;   NASAL SINUS SURGERY  most recent 7-8 yrs ago   7 sinus surgeries    TRIGGER FINGER RELEASE  11/19/2011   Procedure: RELEASE TRIGGER FINGER/A-1 PULLEY;  Surgeon: Nicki Reaper, MD;  Location: [facility];  Service: Orthopedics;  Laterality: Right;  release a-1 pulley right index finger and cyst removal   WRIST GANGLION EXCISION  1980's   right  ONSET DATE: 11/23/22  REFERRING DIAG: TIA  THERAPY DIAG:  Muscle weakness (generalized)  Unsteadiness on feet  Rationale for Evaluation and Treatment: Rehabilitation  SUBJECTIVE:                                                                                                                                                                                             SUBJECTIVE STATEMENT:  Pt feeling a little tired. She says her L knee is not good. Pt reports L knee feels like it is weak. Pt reports brace isn't providing relief anymore. Pt has orthopedic appointment on Monday. She says she is not walking around at home at all due to pain. She and spouse report she was being treated for a UTI but that she never finished her antibiotic course and that she is still symptomatic/urine is dark. Pt reports having diarrhea and nausea when on it and says she was instructed to take smaller dose as a  result, but that she stopped as diarrhea did not improve. PT provides education on importance of following her physician's recommendation for treatment and encouraged pt and spouse to call her physician about not having finished the antibiotic and continued UTI symptoms. Pt reports she also does not eat much and feels full in 2-3 bites. Pt's spouse confirms pt's chronic limited intake.  PT also recommends to pt that she ask physician about working with a dietitian, since she will have difficulty improving muscle mass in PT without proper caloric intake. Pt and her spouse verbalize understanding.  Pt accompanied by: significant other  PERTINENT HISTORY: [name] is an 9 year-old female who was admitted to [facility] on 11/23/2022 with a cerebellar CVA. 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 Pt. Was admitted to Inpatient Rehab from 12/21-1/02/2023. 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  Patient underwent inpatient rehabilitation for 2 weeks.  Patient was assessed and was scheduled for a knee replacement on 08/29/2022 however had to cancel it due to having had a CVA. PMH includes: anemia, arthritis, back pain, depression, hernia, GERD, HLD, HTN, hypothyroidism, knee pain, pancreatic hormone dysfunction, pneumonia, seizures, stroke, thyroid disease.  Patient has a donjoy brace for L knee.   PAIN:  Are you having pain? Yes, only with end range flexion and STS.    PRECAUTIONS: Fall, has latex allergy  WEIGHT BEARING RESTRICTIONS: No  FALLS: Has patient fallen in last 6 months? Yes. Number of falls 4 falls   LIVING ENVIRONMENT: Lives with: lives with their family and lives with their spouse Lives in: House/apartment Stairs: Yes: Internal: flight steps;  and External: 2 steps; on right going up and on left going up Has following equipment at home: Single point cane, Walker - 2 wheeled, Shower bench, and bed side  commode  PLOF: Independent  PATIENT GOALS: to have less pain and move better  OBJECTIVE:   TODAY'S TREATMENT:                                                                                                                              DATE: 09/03/23  TE LAQ 2x10 each LE - Rates medium  Min assist standing pivot transfer from transport chair <>nustep seat 2x. Following initial transfer pt became very nauseated. Session terminated as a result and PT instructed pt to call physician. Pt and spouse verbalize understanding and report they will call following visit.   PATIENT EDUCATION: Education details: instructed pt and her spouse to call physician about UTI symptoms/antibiotics   HOME EXERCISE PROGRAM:  Pending visit 2   GOALS: Goals reviewed with patient? Yes  SHORT TERM GOALS: Target date: 09/19/23  Patient will be independent in starter HEP to improve A/ROM tolerance and preserve available ROM of LLE and to advance A/ROM and strength of RLE.  Baseline: 08/20/23: defer to vists 2-3  Goal status: NEW  LONG TERM GOALS: Target date: 10/20/23  Patient will increase FOTO score to equal to or greater than 65%  to demonstrate statistically significant improvement in ease of mobility.  Baseline:  47%; 06/23/23: 60;   7/16: 60%  Goal status: NEW   2.  Patient (> 43 years old) will complete five times sit to stand test in < 15 seconds indicating an increased RLE power.  Baseline: 5/7: unable to tolerate ; 7/16: 17.4sec Goal status: NEW  3.   Patient to report tolerance of ad lib AMB in home during the day of distances >71ft >5x at supervision level or better to improve independence with ADL.    Baseline: 08/20/23: using transport chair for nearly all mobility, not tolerating walking due to WB limitations of LLE  Goal status: NEW    ASSESSMENT:  CLINICAL IMPRESSION:   Visit significantly limited as pt became very nauseated following a transfer. During this time pt reported to PT  she had not finished antibiotics to treat UTI and that she was still symptomatic. PT provided recommendations, such as terminating session early and that they should call physician (see subjective). They verbalized understanding and agreed to plan. The patient will benefit from skilled physical therapy to reduce pain, improve mobility, improve strength and improve quality of life.     OBJECTIVE IMPAIRMENTS: Abnormal gait, cardiopulmonary status limiting activity, decreased activity tolerance, decreased balance, decreased cognition, decreased endurance, decreased knowledge of use of DME, decreased mobility, difficulty walking, decreased ROM, decreased strength, decreased safety awareness, hypomobility, increased fascial restrictions, impaired perceived functional ability, impaired flexibility, impaired UE functional use, improper body mechanics, postural dysfunction, and pain.   ACTIVITY LIMITATIONS: carrying, lifting, bending, sitting, standing, squatting,  stairs, transfers, bed mobility, bathing, toileting, dressing, reach over head, hygiene/grooming, locomotion level, and caring for others  PARTICIPATION LIMITATIONS: meal prep, cleaning, laundry, medication management, personal finances, interpersonal relationship, driving, shopping, community activity, yard work, school, and church  PERSONAL FACTORS: Age, Behavior pattern, Education, Fitness, Past/current experiences, Time since onset of injury/illness/exacerbation, and 3+ comorbidities: anemia, arthritis, back pain, depression, hernia, GERD, HLD, HTN, hypothyroidism, knee pain, pancreatic hormone dysfunction, pneumonia, seizures, stroke, thyroid disease  are also affecting patient's functional outcome.   REHAB POTENTIAL: Fair    CLINICAL DECISION MAKING: Evolving/moderate complexity  EVALUATION COMPLEXITY: Moderate  PLAN:  PT FREQUENCY: 1-2x/week  PT DURATION: 12 weeks  PLANNED INTERVENTIONS: Therapeutic exercises, Therapeutic activity,  Neuromuscular re-education, Balance training, Gait training, Patient/Family education, Self Care, Joint mobilization, Joint manipulation, Stair training, Vestibular training, Canalith repositioning, Visual/preceptual remediation/compensation, Orthotic/Fit training, DME instructions, Electrical stimulation, Wheelchair mobility training, Spinal mobilization, Cryotherapy, Moist heat, Compression bandaging, scar mobilization, Splintting, Taping, Traction, Ultrasound, Manual therapy, and Re-evaluation  PLAN FOR NEXT SESSION: Shifting focus away from direct Left knee interventions which have not been successful and bring focus to maximizing strength and functional compensation of RLE and BUE for modified household AMB and transfers with RW.    Baird Kay, PT, DPT 09/03/2023, 5:13 PM   5:13 PM, 09/03/23  Physical Therapist - [facility]  [phone] [facility])

## 2023-09-03 NOTE — Therapy (Signed)
Pt. arrived for her therapy sessions today. However, left PT early due to illness with stomach/intestinal issues, and recent UTI.

## 2023-09-07 DIAGNOSIS — G8929 Other chronic pain: Secondary | ICD-10-CM | POA: Diagnosis not present

## 2023-09-07 DIAGNOSIS — M1712 Unilateral primary osteoarthritis, left knee: Secondary | ICD-10-CM | POA: Diagnosis not present

## 2023-09-08 ENCOUNTER — Ambulatory Visit: Payer: Medicare HMO

## 2023-09-08 ENCOUNTER — Ambulatory Visit: Payer: Medicare HMO | Attending: Internal Medicine | Admitting: Occupational Therapy

## 2023-09-08 DIAGNOSIS — R262 Difficulty in walking, not elsewhere classified: Secondary | ICD-10-CM | POA: Diagnosis not present

## 2023-09-08 DIAGNOSIS — R278 Other lack of coordination: Secondary | ICD-10-CM | POA: Diagnosis not present

## 2023-09-08 DIAGNOSIS — I6381 Other cerebral infarction due to occlusion or stenosis of small artery: Secondary | ICD-10-CM | POA: Insufficient documentation

## 2023-09-08 DIAGNOSIS — M6281 Muscle weakness (generalized): Secondary | ICD-10-CM | POA: Diagnosis not present

## 2023-09-08 DIAGNOSIS — R531 Weakness: Secondary | ICD-10-CM | POA: Diagnosis not present

## 2023-09-08 DIAGNOSIS — R2681 Unsteadiness on feet: Secondary | ICD-10-CM | POA: Insufficient documentation

## 2023-09-08 NOTE — Therapy (Signed)
OUTPATIENT PHYSICAL THERAPY TREATMENT   Patient Name: [name] MRN: [id number] DOB:August 08, 1941, 82 y.o., female Today's Date: 09/08/2023   PCP: Aram Beecham, D MD REFERRING PROVIDER: Jacquelynn Cree PA   END OF SESSION:  PT End of Session - 09/08/23 1616     Visit Number 3    Number of Visits 16    Date for PT Re-Evaluation 10/20/23    Authorization Type [employer]:    Authorization Time Period 24 visits approved 04/14/23-09/25/23    Authorization - Number of Visits 24    Progress Note Due on Visit 10    PT Start Time 1615    PT Stop Time 1654    PT Time Calculation (min) 39 min    Equipment Utilized During Treatment Gait belt    Activity Tolerance Patient tolerated treatment well    Behavior During Therapy WFL for tasks assessed/performed                 Past Medical History:  Diagnosis Date   Anemia    Arthritis    Asthma    uses inhaler just prior to surgery to avoid attack   Back pain    from previous injury   Complication of anesthesia    has woken  up during 2 different surgery   Depression    no current issue/treatment; situation   Gallstones    GERD (gastroesophageal reflux disease)    Hiatal hernia    patient does NOT have nerve/muscle disease   History of kidney stones    HLD (hyperlipidemia)    HTN (hypertension)    Hypothyroidism    Kidney stones    Knee pain    Nausea and vomiting 10/15/2022   Non-diabetic pancreatic hormone dysfunction years   pt. 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   Pancreatitis    Pneumonia    Seizures (HCC)    caused by dye injected during a procedure   Shortness of breath    with exertion   Sinus problem    frequent infections/congestion   Stroke (HCC) 2021   reports having CVA in 2021 and having mini strokes before that   Thyroid disease    Past Surgical History:  Procedure Laterality Date   ABDOMINAL HYSTERECTOMY     APPENDECTOMY     CARPAL TUNNEL RELEASE  10+ years ago   bilateral    EYE SURGERY  3 yrs ago   bilateral cataracts   FOOT OSTEOTOMY  6 weeks ago   Left foot: great, 2nd & 3rd   FOOT OSTEOTOMY  5 years ago   Right great toe   HAND SURGERY Bilateral 2011-most recent   multiple hand surgeries, 2 on left, 3 on right   KNEE ARTHROPLASTY Right 04/28/2022   Procedure: COMPUTER ASSISTED TOTAL KNEE ARTHROPLASTY;  Surgeon: Donato Heinz, MD;  Location: [facility];  Service: Orthopedics;  Laterality: Right;   LOOP RECORDER INSERTION N/A 05/16/2020   Procedure: LOOP RECORDER INSERTION;  Surgeon: Marcina Millard, MD;  Location: [facility];  Service: Cardiovascular;  Laterality: N/A;   NASAL SINUS SURGERY  most recent 7-8 yrs ago   7 sinus surgeries    TRIGGER FINGER RELEASE  11/19/2011   Procedure: RELEASE TRIGGER FINGER/A-1 PULLEY;  Surgeon: Nicki Reaper, MD;  Location: [facility];  Service: Orthopedics;  Laterality: Right;  release a-1 pulley right index finger and cyst removal   WRIST GANGLION EXCISION  1980's   right  ONSET DATE: 11/23/22  REFERRING DIAG: TIA  THERAPY DIAG:  Muscle weakness (generalized)  Difficulty in walking, not elsewhere classified  Unsteadiness on feet  Rationale for Evaluation and Treatment: Rehabilitation  SUBJECTIVE:                                                                                                                                                                                             SUBJECTIVE STATEMENT:  Pt saw orthopedist and had cortisone shot in knee. Had pain-free sleep for first time in a long time. She reports she still has pain with standing up. She has follow-up with PCP tomorrow. Pt reports aside from her knee L arm is still sore.   Pt accompanied by: significant other  PERTINENT HISTORY: [name] is an 73 year-old female who was [facility] to [facility] on 11/23/2022 with a cerebellar CVA. 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 Pt. Was  admitted to Inpatient Rehab from 12/21-1/02/2023. 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  Patient underwent inpatient rehabilitation for 2 weeks.  Patient was assessed and was scheduled for a knee replacement on 08/29/2022 however had to cancel it due to having had a CVA. PMH includes: anemia, arthritis, back pain, depression, hernia, GERD, HLD, HTN, hypothyroidism, knee pain, pancreatic hormone dysfunction, pneumonia, seizures, stroke, thyroid disease.  Patient has a donjoy brace for L knee.   PAIN:  Are you having pain? Yes, only with end range flexion and STS.    PRECAUTIONS: Fall, has latex allergy  WEIGHT BEARING RESTRICTIONS: No  FALLS: Has patient fallen in last 6 months? Yes. Number of falls 4 falls   LIVING ENVIRONMENT: Lives with: lives with their family and lives with their spouse Lives in: House/apartment Stairs: Yes: Internal: flight steps;   and External: 2 steps; on right going up and on left going up Has following equipment at home: Single point cane, Walker - 2 wheeled, Shower bench, and bed side commode  PLOF: Independent  PATIENT GOALS: to have less pain and move better  OBJECTIVE:   TODAY'S TREATMENT:  DATE: 09/08/23  TE LAQ 3x10 each LE - Rates medium  Seated march 3x10-12 STS to RW 2x5 no pain - only mild knee pain with last 2 reps LAQ 10x each LE  Amb with RW and chair follow - attempted but pain limited/discontinued  Stand pivot transfer transport chair<>nustep 2x min assist Nustep lvl 1 x 8 min with PT assisting throughout - min assist for mount/dismount STS to RW 3x  Standing LTL weight-shift 10x within pain-free/minimal range Standing endurance at RW x 45 sec Standing attempt forward step - discontinued still pain limited     PATIENT EDUCATION: Education details: instructed  pt and her spouse to call physician about UTI symptoms/antibiotics   HOME EXERCISE PROGRAM:  Pending visit 2   GOALS: Goals reviewed with patient? Yes  SHORT TERM GOALS: Target date: 09/19/23  Patient will be independent in starter HEP to improve A/ROM tolerance and preserve available ROM of LLE and to advance A/ROM and strength of RLE.  Baseline: 08/20/23: defer to vists 2-3  Goal status: NEW  LONG TERM GOALS: Target date: 10/20/23  Patient will increase FOTO score to equal to or greater than 65%  to demonstrate statistically significant improvement in ease of mobility.  Baseline:  47%; 06/23/23: 60;   7/16: 60%  Goal status: NEW   2.  Patient (> 79 years old) will complete five times sit to stand test in < 15 seconds indicating an increased RLE power.  Baseline: 5/7: unable to tolerate ; 7/16: 17.4sec Goal status: NEW  3.   Patient to report tolerance of ad lib AMB in home during the day of distances >35ft >5x at supervision level or better to improve independence with ADL.    Baseline: 08/20/23: using transport chair for nearly all mobility, not tolerating walking due to WB limitations of LLE  Goal status: NEW    ASSESSMENT:  CLINICAL IMPRESSION:   Pt shows significant progress this session, tolerating multiple strengthening exercises and even completing standing exercise without increased pain. Pt still unable to ambulate, however, due to knee pain. The patient will benefit from skilled physical therapy to reduce pain, improve mobility, improve strength and improve quality of life.     OBJECTIVE IMPAIRMENTS: Abnormal gait, cardiopulmonary status limiting activity, decreased activity tolerance, decreased balance, decreased cognition, decreased endurance, decreased knowledge of use of DME, decreased mobility, difficulty walking, decreased ROM, decreased strength, decreased safety awareness, hypomobility, increased fascial restrictions, impaired perceived functional ability,  impaired flexibility, impaired UE functional use, improper body mechanics, postural dysfunction, and pain.   ACTIVITY LIMITATIONS: carrying, lifting, bending, sitting, standing, squatting, stairs, transfers, bed mobility, bathing, toileting, dressing, reach over head, hygiene/grooming, locomotion level, and caring for others  PARTICIPATION LIMITATIONS: meal prep, cleaning, laundry, medication management, personal finances, interpersonal relationship, driving, shopping, community activity, yard work, school, and church  PERSONAL FACTORS: Age, Behavior pattern, Education, Fitness, Past/current experiences, Time since onset of injury/illness/exacerbation, and 3+ comorbidities: anemia, arthritis, back pain, depression, hernia, GERD, HLD, HTN, hypothyroidism, knee pain, pancreatic hormone dysfunction, pneumonia, seizures, stroke, thyroid disease  are also affecting patient's functional outcome.   REHAB POTENTIAL: Fair    CLINICAL DECISION MAKING: Evolving/moderate complexity  EVALUATION COMPLEXITY: Moderate  PLAN:  PT FREQUENCY: 1-2x/week  PT DURATION: 12 weeks  PLANNED INTERVENTIONS: Therapeutic exercises, Therapeutic activity, Neuromuscular re-education, Balance training, Gait training, Patient/Family education, Self Care, Joint mobilization, Joint manipulation, Stair training, Vestibular training, Canalith repositioning, Visual/preceptual remediation/compensation, Orthotic/Fit training, DME instructions, Electrical stimulation, Wheelchair mobility training, Spinal mobilization, Cryotherapy, Moist heat, Compression  bandaging, scar mobilization, Splintting, Taping, Traction, Ultrasound, Manual therapy, and Re-evaluation  PLAN FOR NEXT SESSION: Shifting focus away from direct Left knee interventions which have not been successful and bring focus to maximizing strength and functional compensation of RLE and BUE for modified household AMB and transfers with RW.    Baird Kay, PT,  DPT 09/08/2023, 5:03 PM   5:03 PM, 09/08/23  Physical Therapist - [facility]  [phone] [facility])

## 2023-09-08 NOTE — Therapy (Signed)
Occupational Therapy Treatment Note  Patient Name: [name] MRN: [id number] DOB:Jun 04, 1941, 82 y.o., female Today's Date: 09/08/2023  PCP: Dr. Judithann Sheen REFERRING PROVIDER:  Dr. Judithann Sheen  END OF SESSION:  PLAN:   OT End of Session - 09/08/23 1754     Visit Number 59    Number of Visits 96    Date for OT Re-Evaluation 11/12/23    OT Start Time 1535    OT Stop Time 1615    OT Time Calculation (min) 40 min    Activity Tolerance Patient tolerated treatment well    Behavior During Therapy WFL for tasks assessed/performed               Past Medical History:  Diagnosis Date   Anemia    Arthritis    Asthma    uses inhaler just prior to surgery to avoid attack   Back pain    from previous injury   Complication of anesthesia    has woken  up during 2 different surgery   Depression    no current issue/treatment; situation   Gallstones    GERD (gastroesophageal reflux disease)    Hiatal hernia    patient does NOT have nerve/muscle disease   History of kidney stones    HLD (hyperlipidemia)    HTN (hypertension)    Hypothyroidism    Kidney stones    Knee pain    Nausea and vomiting 10/15/2022   Non-diabetic pancreatic hormone dysfunction years   pt. 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   Pancreatitis    Pneumonia    Seizures (HCC)    caused by dye injected during a procedure   Shortness of breath    with exertion   Sinus problem    frequent infections/congestion   Stroke (HCC) 2021   reports having CVA in 2021 and having mini strokes before that   Thyroid disease    Past Surgical History:  Procedure Laterality Date   ABDOMINAL HYSTERECTOMY     APPENDECTOMY     CARPAL TUNNEL RELEASE  10+ years ago   bilateral   EYE SURGERY  3 yrs ago   bilateral cataracts   FOOT OSTEOTOMY  6 weeks ago   Left foot: great, 2nd & 3rd   FOOT OSTEOTOMY  5 years ago   Right great toe   HAND SURGERY Bilateral 2011-most recent   multiple hand surgeries, 2 on left, 3 on  right   KNEE ARTHROPLASTY Right 04/28/2022   Procedure: COMPUTER ASSISTED TOTAL KNEE ARTHROPLASTY;  Surgeon: Donato Heinz, MD;  Location: [facility];  Service: Orthopedics;  Laterality: Right;   LOOP RECORDER INSERTION N/A 05/16/2020   Procedure: LOOP RECORDER INSERTION;  Surgeon: Marcina Millard, MD;  Location: [facility];  Service: Cardiovascular;  Laterality: N/A;   NASAL SINUS SURGERY  most recent 7-8 yrs ago   7 sinus surgeries    TRIGGER FINGER RELEASE  11/19/2011   Procedure: RELEASE TRIGGER FINGER/A-1 PULLEY;  Surgeon: Nicki Reaper, MD;  Location: [facility];  Service: Orthopedics;  Laterality: Right;  release a-1 pulley right index finger and cyst removal   WRIST GANGLION EXCISION  1980's   right   Patient Active Problem List   Diagnosis Date Noted   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 11/27/2022   Hypomagnesemia 11/27/2022   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   HLD (hyperlipidemia) 11/23/2022   Asthma 11/23/2022   Depression with anxiety 11/23/2022   Chronic diastolic CHF (  congestive heart failure) (HCC) 11/23/2022   Normocytic anemia 11/23/2022   Aspiration pneumonia (HCC) 11/23/2022   AKI (acute kidney injury) (HCC) 11/23/2022   Abdominal pain 11/23/2022   Mesenteric mass 11/23/2022   Coffee ground emesis 11/23/2022   Nausea and vomiting 10/15/2022   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   Fatigue 09/17/2022   Left hemiparesis (HCC) 09/17/2022   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HCC) 08/22/2022   GERD (gastroesophageal reflux disease) 08/21/2022   Agitation 08/20/2022   Acute left-sided weakness 08/20/2022   Expressive aphasia    Stroke (HCC) 08/19/2022   Leukocytosis 08/19/2022   History of urticaria 04/28/2022   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   Primary osteoarthritis of left knee 02/24/2022   Primary osteoarthritis of right knee 02/24/2022   Lumbar spondylolysis 04/12/2020   History of CVA  (cerebrovascular accident) 03/26/2020   Low back pain radiating to right lower extremity 03/21/2020   B12 deficiency 03/06/2020   Positive anti-CCP test 12/21/2019   Arthralgia 12/13/2019   Dermatitis 12/13/2019   Rheumatoid factor positive 12/13/2019   Essential hypertension 12/11/2018   Palpitations 12/11/2018   Acquired hypothyroidism 11/10/2018   Arthritis of knee 09/17/2016   Anxiety 11/22/2014   Asthma without status asthmaticus 11/22/2014   Benign neoplasm of colon, unspecified 11/22/2014   Environmental allergies 11/22/2014   Hypertriglyceridemia 11/22/2014   Hypokalemia 11/22/2014   Personal history of disease of skin and subcutaneous tissue 11/22/2014   REFERRING DIAG: CVA   THERAPY DIAG:  Muscle weakness (generalized)   Other lack of coordination   Rationale for Evaluation and Treatment Rehabilitation   SUBJECTIVE:   SUBJECTIVE STATEMENT:  Pt. Reports feeling better today  Pt accompanied by: significant other   PERTINENT HISTORY: Patient is an 82 year-old female who was admitted to [facility] on 11/23/2022 with a cerebellar CVA. 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 Pt. Was admitted to Inpatient Rehab from 12/21-1/02/2023. 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  Patient underwent inpatient rehabilitation for 2 weeks.  Patient was assessed and was scheduled for a knee replacement on 08/29/2022 however had to cancel it due to having had a CVA.  Patient had a recent fall 2 days after discharging from inpatient rehab. Past Medical History includes: Knee replacement, essential HTN, hypokalemia, leukocytosis, seizures, positive anti-- CCP test, anxiety disorder, mini strokes.  Patient had shingles with left eye nerve pain s/p 1 year ago.    PRECAUTIONS: Fall   WEIGHT BEARING RESTRICTIONS No   PAIN:  Are you having pain?  No   FALLS: Has patient fallen in last 6 months? Yes.  Number of falls 1   LIVING ENVIRONMENT: Lives with: Lives with Spouse Lives in: House/apartment Stairs: 2 storey home, resides on the first floor.  External: 2 stairs front no rails, and 6 in back with rails Has following equipment at home: Single point cane, Walker - 2 wheeled, Environmental consultant - 4 wheeled, Shower bench, and bed side commode   PLOF: Independent   PATIENT GOALS  To Regain the use of her left arm   OBJECTIVE:    HAND DOMINANCE: Right   ADLs: Overall ADLs: Husband assists pt. as needed Transfers/ambulation related to ADLs:Pt. Uses a 3 wheeled walker with Husband assist. Eating: Pt. Is independent with the right hand. Pt. has difficulty cutting food. Grooming: Pt. is using her right hand, however has difficulty sustaining her LUE in elevation to assist with haircare. UB Dressing: Pt. Is independent donning a pullover shirt, and  button down shirt. Has difficulty with buttoning, LB Dressing:  Independent donning pants, and socks. Difficulty tying shoes. Toileting: Independent Bathing: Pt. Is able to engage her right hand. Tub Shower transfers: Supervision Equipment: See above for equipment    IADLs: Shopping:  Has not had the opportunity for grocery shopping yet Light housekeeping: Husband is assisting with light house keeping Meal Prep:  Dependent Community mobility: Relies of family/friends Medication management: Husband assisting with weekly pillbox set-up, and administering medication. Financial management: TBD Handwriting: 75% legible   MOBILITY STATUS: Hx of falls   POSTURE COMMENTS:  No Significant postural limitations Sitting balance: supported sitting balance WFL   ACTIVITY TOLERANCE: Activity tolerance:  Fatigues in greater than 30 min.    FUNCTIONAL OUTCOME MEASURES: FOTO: 57   UPPER EXTREMITY ROM      Active ROM Right Eval: WFL Left eval Left  01/22/23 Left  03/03/23 Left  04/14/23 Left  05/28/2023 Left 07/23/2023 Left 08/20/2023  Shoulder flexion    132 100 108 108 108 60(70) 70(78)  Shoulder abduction   80 85 85 85 85 65(78) 72(85)  Shoulder adduction            Shoulder extension            Shoulder internal rotation            Shoulder external rotation            Elbow flexion   140 140 WFL [facility] WFL  Elbow extension   WNL WNL [facility] WFL WFL  Wrist flexion   65 68       Wrist extension   -10 20 24  32 32 -10 20  Wrist ulnar deviation     12 10 14 14 10 18   Wrist radial deviation     8 14 14 16 12 14   Wrist pronation            Wrist supination            (Blank rows = not tested)   Left digit flexion to [facility]: 2nd: 0cm, 3rd: 0cm, 4th: 0cm, 5th: 0cm   Limited Left full 2nd digit extension     UPPER EXTREMITY MMT:      MMT Right Eval: 4+/5 overall Right 07/23/2023 Left Eval Left 01/22/23 Left 03/03/23 Left  04/14/2023 Left 05/28/2023 Left 07/23/2023 Left 08/20/2023  Shoulder flexion   3+/5 3/5 3-/5 3-/5 3-/5 3-/5 2/5 2+/5  Shoulder abduction   3+/5 3-/5 3-/5 3-/5 3-/5 3-/5 2/5 2+/5  Shoulder adduction             Shoulder extension             Shoulder internal rotation             Shoulder external rotation             Middle trapezius             Lower trapezius             Elbow flexion   4/5 3+/5 4/5 N/A 4/5 4/5 3+/5 3+/5  Elbow extension   4/5 3+/5 4/4 N/A 4/5 4/5 3+/5 3+/5  Wrist flexion             Wrist extension   4/5 2-/5 3-/5 3-/5 3/5 3+/5 3-/5 3+/5  Wrist ulnar deviation             Wrist radial deviation  Wrist pronation             Wrist supination             (Blank rows = not tested)   HAND FUNCTION: Grip strength: Right: 26#, Left: 10# Pinch strength: Right 8#, Left: 3#, 3 Pt. Pinch strength: Right: 9#, L: 2#  01/22/2023 Grip strength: Right: 26#, Left: 12# Pinch strength:  Pinch meter used at the initial eval has been sent out for recalibration   03/03/2023 Grip strength: Right: 26#, Left: 13# Pinch strength:  Pinch meter used at the initial eval has been sent out for  recalibration  04/14/2023: Grip strength: Right: 26#, Left: 13# Pinch strength:   Right 8#, Left: 4#, 3 Pt. Pinch strength: Right: 9#, L: 3#    05/28/2023: Grip strength: Right: 26#, Left: 13# Pinch strength:   Right 8#, Left: 2#, 3 Pt. Pinch strength: Right: 9#, L: 4#  07/23/2023  Grip strength: Right: 5#, Left: 2# Pinch strength:   Right 6#, Left: 2#, 3 Pt. Pinch strength: Right: 5#, L: 2#  08/20/2023:    Grip strength: Right: 8#, Left: 4# Pinch strength:   Right 6#, Left: 2#, 3 Pt. Pinch strength: Right: 5#, L: 2#       COORDINATION: Right: 22 sec., Left: <5 min. To place 7 pegs with increased compensation proximally in the trunk, and through reflexive associated reactions.  01/22/23 Right: 22 sec., Left: 3 min. & 4 sec.  03/03/23 Right: 22 sec., Left: 1 min. & 39 sec.  04/14/23   TBD  05/28/23 Right: 22 sec., Left:  5 min.  07/23/2023  Right: 33 sec., Left:  Pt. Is unable to grasp, and place pegs into the pegboard. Pt. Was able to remove 9 vertical pegs in 34 sec.  08/20/2023:  Right: 27 sec., Left:  5 pegs placed in 5 min.; Pt. Was able to remove 9 vertical pegs in 28 sec.    SENSATION: Light touch: WFL, proprioceptive awareness: Intact   EDEMA: N/A   MUSCLE TONE: LUE: Hypotonic   COGNITION: Overall cognitive status: WFL for tasks assessed. Pt. Is impulsive at times.   VISION: Subjective report: Pt. report having shingles affecting left eye  s/p 1 year. Has nerve pain Baseline vision: Wears glasses for reading only Visual history:  updated see clinical impression   VISION ASSESSMENT:    WFL for tasks performed   PERCEPTION: Intact   PRAXIS: Impaired: Motor planning   OBSERVATIONS:  Pt. more alert, and engaging since prior to the most recent hospitalization.   TODAY'S TREATMENT:     Neuromuscular re-education:   Pt. worked on left [facility] skills grasping 1" circular objects, with emphasis placed on incorporating her 2nd digit, and thumb during the task.  Pt.worked on grasping them from a container, then modified to grasping them from an elevated surface, and placing them onto a pegboard. Pt. then worked on removing them from the pegboard. Pt. Worked on progressing to grasping 1/2" cubes, and stacking a tower of 3 vertical cubes.        PATIENT EDUCATION: Education details: LUE therapeutic exercise Person educated: Patient and Spouse Education method: Medical illustrator Education comprehension: verbalized understanding, returned demonstration, and needs further education   HOME EXERCISE PROGRAM:   Reviewed activities at home to promote isolated 2nd digit extension.    GOALS: Goals reviewed with patient? Yes   SHORT TERM GOALS: Target date: 10/01/2023       1. Patient will be independent with home exercise program  for the left upper extremity Baseline: 08/20/23: Pt. continues to require assist from he husband for HEPs 07/23/2023: Pt. continues to require assist. 05/28/2023: Pt. Requires assist 04/09/2023: Pt. Continues to consistently attempt to engage her hand at hand 02/01/2023: Pt. Consistently attempts to perform HEPs independently. No current home exercise program Goal status: Ongoing   LONG TERM GOALS: Target date:  08/20/2023   1.  Patient will improve left shoulder strength by 2 mm grades to be able to sustain UE's in elevation long enough to wash her hair.  Baseline: 08/20/2023: Left shoulder flexion 2+/5, abduction 2+/5  07/23/2023: Left shoulder flexion: 2/5, abduction: 2/5. 05/28/2023: Left shoulder flexion: 3-/5, abduction: 3-/5. Pt. has an old left shoulder injury limiting progression with left shoulder strength 05/26/2023: Pt. continues to present with limitiations in sustaining LUE elevation long enough to perform hair care thoroughly 04/09/2023: Pt. is limited with sustaining LUE elevation long enough to perform hair care thoroughly. 03/03/2023: Left shoulder flexion: 3-/5, abduction: 3-/5 01/22/2023: Left shoulder flexion: 3-/5,  abduction: 3-/5 Eval: Left shoulder flexion: 3/5, abduction: 3-/5 Goal status: Improving, ongoing   2.  Patient will improve left shoulder active abduction to be able to comb her hair Baseline: 9/11/09/2023: 72(85) Pt. is able to reach her left side, and back of hair. 07/23/2023: Left shoulder flexion: 60(70) abduction: 65(78) 05/28/2023: left shoulder flexion: 108 abduction: 85 Pt. Has an old left shoulder injury limiting progression with left shoulder ROM 05/26/2023:  5/10 left shoulder pain with ROM limits using it functionally during hair care. 04/09/2023: Pt. Presents with difficulty abducting her left shoulder enough to thoroughly complete haircare. 03/03/2023: Shoulder abduction: 85 01/22/2023: Shoulder abduction: 85 Eval: Left shoulder abduction is 80(108) Goal status: Improving, Ongoing   3.  Patient will independently button her shirt with modified independence. Baseline: 08/20/2023: Pt. requires increased time to complete, and assist from her husband. 07/23/2023 Pt. presents with difficulty buttoning her shirt. 05/28/2023: pt. Continues to work towards progressing with buttoning 05/26/2023:  Pt. Continues to progress towards buttoning. 04/09/2023: Pt. Continues to progress towards buttoning. 03/03/2023: Pt. Continues to have difficulty with buttoning. 01/22/2023: Pt. continues to have difficulty. Eval: Patient has difficulty.  Goal status: Ongoing   4.  Patient well improve left grip strength in preparation for securely hold a glass/beverage Baseline: 08/20/2023: Right: 8#, Left: 4# Pt. Is able to more securely hold mirrors, and flowers with the left hand, however has difficulty holding a glass beverage, or bottle. 07/23/2023: Grip strength: Right: 5#, Left: 2# 05/28/2023: Right: 26#, Left: 13#  pt. Presents with difficulty securely holding flowers in her left hand. 05/26/2023: TBD 04/09/2023: Pt. Is able to hold, and hike pants with the left hand, continues to have difficulty with securely holding flowers.    03/03/2023: left grip strength: 13# 01/22/2023: Left: 12# Eval: Pt. Is unable to securely hold flowers. Goal status: Improving, revised  to securely holding a glass   5.  Pt. will independently recall adaptive  strategies for performing ADL/ADL kitchen tasks including:  Baseline: 08/20/2023: Revised to include IADL kitchen tasks.  07/23/2023: Continue 05/28/2023: Pt. Needs continued education about adaptive strategies.05/26/2023: Continue 04/09/2023: Continue3/26/2024: Continue 01/22/2023: Pt. continues to benefit from education about adaptive strategies during ADLs, and IADLs. Eval: Pt. to be provided with adaptive strategies. Goal status: Revised   6.  Pt. will improve FOTO score by 2 points to reflect patient perceived performance improvement assessment specific ADLs  and IADLs Baseline: 1/61/0960: FOTO score: 57 05/28/2023: FOTO score 59 05/26/2023: TBD 5/02/204:  TBD 03/03/23: FOTO 62 Eval: 57 Goal status: Ongoing  7.  Pt. will improve left hand coordination skills in order to be able to  manipulate small objects.     Baseline: 08/20/2023: Right: 27 sec., Left:  5 pegs placed in 5 min.; Pt. Was able to remove 9 vertical pegs in 28 sec. Pt. Is able to hold, and sort utensils in a drawer. Pt. Has difficulty manipulating small objects. 07/23/2023: Right: 33 sec., Left:  Pt. Is unable to grasp, and place pegs into the pegboard. Pt. Was able to remove 9 vertical pegs in 34 sec. 05/28/2023: Left:  5 min. 05/26/2023 Pt. Continues to present with difficulty handling and sorting utensils. 04/14/2023: 56 04/09/2023: TBD 03/03/23: 1 min. & 39 01/22/2022: Left: 3 min. & 4 sec. Eval: Pt. has difficulty sorting, and placing utensils with the left hand. Left FMC : >5 min. For 7 pegs on the 9 hole peg test.    Goal Status: Improving, Revised to be able to improve left hand FMC to be able to manipulate  small objects   8. Pt. will improve active left 2nd digit extension to be able able to isolate her 2nd digit in preparation for  pressing/pushing buttons on appliances, phones, or remotes. Baseline: 08/20/2023: Pt. Is more consistently able to isolate 2nd digit extension with increased time. 07/23/2023: Pt. Continues to work on consistency with isolating left 2nd digit extension 05/28/2023: Pt. Continues to improve with left 2nd digit extension 05/26/2023: Improving with left 2nd digit extension. 04/09/2023: Pt. Is progressing with isolating left 2nd digit extension, however continues to present with limited increased flexor tone. 03/03/23: Pt. Continues to work on improving consistency with 2nd digit extension to press the remote. 01/22/2023: is able to perform full digit extension, although 2nd digit is slow to extend s 2/2 flexor tone. Pt.  Eval: Pt. is able to is unable to actively perform full digit extension Goal status: Improving Ongoing  9: Pt. Will cut food with modified independence    Baseline: 08/20/2023: Pt. Is unable to cut the food on her plate, as well as cut food for meal preparation.    Goal Status: New   ASSESSMENT:   CLINICAL IMPRESSION:  Pt. reports receiving an injection in her left knee yesterday. Pt. has started feeling better, and is now able to engage her left hand more during daily tasks. Pt. required cues for grasp, and prehension patterns when grasping the 1" circular spheres, and the 1/2" cubes in various contexts. Pt. Requires cues to engage the 2nd digit, and thumb when grasping the objects. Pt. Was able to stack a vertical tower of 3 cubes, however had difficulty unstacking them while grasping the cubes from the unstable surface. Pt. continues to benefit from skilled OT services to work on improving BUE strength, and coordination skills, as well as left shoulder, elbow, and wrist strength, improving motor control and coordination in order to prepare the left upper extremity and hand for functional reaching and engaging the upper left extremity when performing hair care, buttoning, holding flowers, cutting  food, and manipulating utensils to set the table as well as adaptive strategies during daily IADL care.     PERFORMANCE DEFICITS in functional skills including ADLs, IADLs, coordination, proprioception, ROM, strength, FMC, and GMC, cognitive skills including memory, and psychosocial skills including coping strategies, environmental adaptation, interpersonal interactions, and routines and behaviors.    IMPAIRMENTS are limiting patient from ADLs, IADLs, education, leisure, and social participation.    COMORBIDITIES may have co-morbidities  that affects occupational performance. Patient will benefit from skilled OT to address above impairments and improve overall function.   MODIFICATION OR ASSISTANCE TO COMPLETE EVALUATION: Min-Moderate modification of tasks or assist with assess necessary to complete an evaluation.   OT OCCUPATIONAL PROFILE AND HISTORY: Detailed assessment: Review of records and additional review of physical, cognitive, psychosocial history related to current functional performance.   CLINICAL DECISION MAKING: Moderate - several treatment options, min-mod task modification necessary   REHAB POTENTIAL: Good   EVALUATION COMPLEXITY: Moderate      PLAN: OT FREQUENCY: 2x/week   OT DURATION: 12 weeks   PLANNED INTERVENTIONS: self care/ADL training, therapeutic exercise, therapeutic activity, neuromuscular re-education, manual therapy, passive range of motion, functional mobility training, electrical stimulation, and paraffin   RECOMMENDED OTHER SERVICES: PT   CONSULTED AND AGREED WITH PLAN OF CARE: Patient and family member/caregiver   PLAN FOR NEXT SESSION: see above    Olegario Messier, MS, OTR/L  09/08/2023

## 2023-09-09 DIAGNOSIS — I10 Essential (primary) hypertension: Secondary | ICD-10-CM | POA: Diagnosis not present

## 2023-09-09 DIAGNOSIS — E538 Deficiency of other specified B group vitamins: Secondary | ICD-10-CM | POA: Diagnosis not present

## 2023-09-09 DIAGNOSIS — F419 Anxiety disorder, unspecified: Secondary | ICD-10-CM | POA: Diagnosis not present

## 2023-09-09 DIAGNOSIS — I69954 Hemiplegia and hemiparesis following unspecified cerebrovascular disease affecting left non-dominant side: Secondary | ICD-10-CM | POA: Diagnosis not present

## 2023-09-09 DIAGNOSIS — R7303 Prediabetes: Secondary | ICD-10-CM | POA: Diagnosis not present

## 2023-09-09 DIAGNOSIS — E039 Hypothyroidism, unspecified: Secondary | ICD-10-CM | POA: Diagnosis not present

## 2023-09-09 DIAGNOSIS — E781 Pure hyperglyceridemia: Secondary | ICD-10-CM | POA: Diagnosis not present

## 2023-09-10 ENCOUNTER — Ambulatory Visit: Payer: Medicare HMO | Admitting: Physical Therapy

## 2023-09-10 ENCOUNTER — Ambulatory Visit: Payer: Medicare HMO | Admitting: Occupational Therapy

## 2023-09-15 ENCOUNTER — Ambulatory Visit: Payer: Medicare HMO | Admitting: Occupational Therapy

## 2023-09-15 ENCOUNTER — Ambulatory Visit: Payer: Medicare HMO | Admitting: Physical Therapy

## 2023-09-17 ENCOUNTER — Ambulatory Visit: Payer: Medicare HMO | Admitting: Physical Therapy

## 2023-09-17 ENCOUNTER — Ambulatory Visit: Payer: Medicare HMO | Admitting: Occupational Therapy

## 2023-09-17 DIAGNOSIS — M6281 Muscle weakness (generalized): Secondary | ICD-10-CM | POA: Diagnosis not present

## 2023-09-17 DIAGNOSIS — R278 Other lack of coordination: Secondary | ICD-10-CM

## 2023-09-17 DIAGNOSIS — R2681 Unsteadiness on feet: Secondary | ICD-10-CM | POA: Diagnosis not present

## 2023-09-17 DIAGNOSIS — R531 Weakness: Secondary | ICD-10-CM | POA: Diagnosis not present

## 2023-09-17 DIAGNOSIS — R262 Difficulty in walking, not elsewhere classified: Secondary | ICD-10-CM | POA: Diagnosis not present

## 2023-09-17 DIAGNOSIS — I6381 Other cerebral infarction due to occlusion or stenosis of small artery: Secondary | ICD-10-CM | POA: Diagnosis not present

## 2023-09-17 NOTE — Therapy (Signed)
Occupational Therapy Progress Note  Dates of reporting period  07/23/2023   to   09/17/2023   Patient Name: [name] MRN: [id number] DOB:07-13-1941, 82 y.o., female Today's Date: 09/17/2023  PCP: Dr. Judithann Sheen REFERRING PROVIDER:  Dr. Judithann Sheen  END OF SESSION:  PLAN:   OT End of Session - 09/17/23 1534     Visit Number 60    Number of Visits 96    Date for OT Re-Evaluation 11/12/23    Authorization Time Period Progress report period starting 07/23/2023    OT Start Time 1530    OT Stop Time 1615    OT Time Calculation (min) 45 min    Equipment Utilized During Treatment Wheelchair    Activity Tolerance Patient tolerated treatment well    Behavior During Therapy WFL for tasks assessed/performed               Past Medical History:  Diagnosis Date   Anemia    Arthritis    Asthma    uses inhaler just prior to surgery to avoid attack   Back pain    from previous injury   Complication of anesthesia    has woken  up during 2 different surgery   Depression    no current issue/treatment; situation   Gallstones    GERD (gastroesophageal reflux disease)    Hiatal hernia    patient does NOT have nerve/muscle disease   History of kidney stones    HLD (hyperlipidemia)    HTN (hypertension)    Hypothyroidism    Kidney stones    Knee pain    Nausea and vomiting 10/15/2022   Non-diabetic pancreatic hormone dysfunction years   pt. 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   Pancreatitis    Pneumonia    Seizures (HCC)    caused by dye injected during a procedure   Shortness of breath    with exertion   Sinus problem    frequent infections/congestion   Stroke (HCC) 2021   reports having CVA in 2021 and having mini strokes before that   Thyroid disease    Past Surgical History:  Procedure Laterality Date   ABDOMINAL HYSTERECTOMY     APPENDECTOMY     CARPAL TUNNEL RELEASE  10+ years ago   bilateral   EYE SURGERY  3 yrs ago   bilateral cataracts   FOOT OSTEOTOMY  6  weeks ago   Left foot: great, 2nd & 3rd   FOOT OSTEOTOMY  5 years ago   Right great toe   HAND SURGERY Bilateral 2011-most recent   multiple hand surgeries, 2 on left, 3 on right   KNEE ARTHROPLASTY Right 04/28/2022   Procedure: COMPUTER ASSISTED TOTAL KNEE ARTHROPLASTY;  Surgeon: Donato Heinz, MD;  Location: [facility];  Service: Orthopedics;  Laterality: Right;   LOOP RECORDER INSERTION N/A 05/16/2020   Procedure: LOOP RECORDER INSERTION;  Surgeon: Marcina Millard, MD;  Location: [facility];  Service: Cardiovascular;  Laterality: N/A;   NASAL SINUS SURGERY  most recent 7-8 yrs ago   7 sinus surgeries    TRIGGER FINGER RELEASE  11/19/2011   Procedure: RELEASE TRIGGER FINGER/A-1 PULLEY;  Surgeon: Nicki Reaper, MD;  Location: [facility];  Service: Orthopedics;  Laterality: Right;  release a-1 pulley right index finger and cyst removal   WRIST GANGLION EXCISION  1980's   right   Patient Active Problem List   Diagnosis Date Noted   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  11/27/2022   Hypomagnesemia 11/27/2022   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   HLD (hyperlipidemia) 11/23/2022   Asthma 11/23/2022   Depression with anxiety 11/23/2022   Chronic diastolic CHF (congestive heart failure) (HCC) 11/23/2022   Normocytic anemia 11/23/2022   Aspiration pneumonia (HCC) 11/23/2022   AKI (acute kidney injury) (HCC) 11/23/2022   Abdominal pain 11/23/2022   Mesenteric mass 11/23/2022   Coffee ground emesis 11/23/2022   Nausea and vomiting 10/15/2022   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   Fatigue 09/17/2022   Left hemiparesis (HCC) 09/17/2022   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HCC) 08/22/2022   GERD (gastroesophageal reflux disease) 08/21/2022   Agitation 08/20/2022   Acute left-sided weakness 08/20/2022   Expressive aphasia    Stroke (HCC) 08/19/2022   Leukocytosis 08/19/2022   History of urticaria 04/28/2022   Total knee replacement status  04/28/2022   Primary osteoarthritis of left knee 02/24/2022   Primary osteoarthritis of right knee 02/24/2022   Lumbar spondylolysis 04/12/2020   History of CVA (cerebrovascular accident) 03/26/2020   Low back pain radiating to right lower extremity 03/21/2020   B12 deficiency 03/06/2020   Positive anti-CCP test 12/21/2019   Arthralgia 12/13/2019   Dermatitis 12/13/2019   Rheumatoid factor positive 12/13/2019   Essential hypertension 12/11/2018   Palpitations 12/11/2018   Acquired hypothyroidism 11/10/2018   Arthritis of knee 09/17/2016   Anxiety 11/22/2014   Asthma without status asthmaticus 11/22/2014   Benign neoplasm of colon, unspecified 11/22/2014   Environmental allergies 11/22/2014   Hypertriglyceridemia 11/22/2014   Hypokalemia 11/22/2014   Personal history of disease of skin and subcutaneous tissue 11/22/2014   REFERRING DIAG: CVA   THERAPY DIAG:  Muscle weakness (generalized)   Other lack of coordination   Rationale for Evaluation and Treatment Rehabilitation   SUBJECTIVE:   SUBJECTIVE STATEMENT:  Pt. reports feeling better today  Pt accompanied by: significant other   PERTINENT HISTORY: Patient is an 82 year-old female who was admitted to [facility] on 11/23/2022 with a cerebellar CVA. 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 Pt. Was admitted to Inpatient Rehab from 12/21-1/02/2023. 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  Patient underwent inpatient rehabilitation for 2 weeks.  Patient was assessed and was scheduled for a knee replacement on 08/29/2022 however had to cancel it due to having had a CVA.  Patient had a recent fall 2 days after discharging from inpatient rehab. Past Medical History includes: Knee replacement, essential HTN, hypokalemia, leukocytosis, seizures, positive anti-- CCP test, anxiety disorder, mini strokes.  Patient had shingles with left eye nerve pain s/p  1 year ago.    PRECAUTIONS: Fall   WEIGHT BEARING RESTRICTIONS No   PAIN:  Are you having pain?  No   FALLS: Has patient fallen in last 6 months? Yes. Number of falls 1   LIVING ENVIRONMENT: Lives with: Lives with Spouse Lives in: House/apartment Stairs: 2 storey home, resides on the first floor.  External: 2 stairs front no rails, and 6 in back with rails Has following equipment at home: Single point cane, Walker - 2 wheeled, Environmental consultant - 4 wheeled, Shower bench, and bed side commode   PLOF: Independent   PATIENT GOALS  To Regain the use of her left arm   OBJECTIVE:    HAND DOMINANCE: Right   ADLs: Overall ADLs: Husband assists pt. as needed Transfers/ambulation related to ADLs:Pt. Uses a 3 wheeled walker with Husband assist. Eating: Pt. Is independent with the right hand. Pt.  has difficulty cutting food. Grooming: Pt. is using her right hand, however has difficulty sustaining her LUE in elevation to assist with haircare. UB Dressing: Pt. Is independent donning a pullover shirt, and button down shirt. Has difficulty with buttoning, LB Dressing:  Independent donning pants, and socks. Difficulty tying shoes. Toileting: Independent Bathing: Pt. Is able to engage her right hand. Tub Shower transfers: Supervision Equipment: See above for equipment    IADLs: Shopping:  Has not had the opportunity for grocery shopping yet Light housekeeping: Husband is assisting with light house keeping Meal Prep:  Dependent Community mobility: Relies of family/friends Medication management: Husband assisting with weekly pillbox set-up, and administering medication. Financial management: TBD Handwriting: 75% legible   MOBILITY STATUS: Hx of falls   POSTURE COMMENTS:  No Significant postural limitations Sitting balance: supported sitting balance WFL   ACTIVITY TOLERANCE: Activity tolerance:  Fatigues in greater than 30 min.    FUNCTIONAL OUTCOME MEASURES: FOTO: 57   UPPER EXTREMITY ROM       Active ROM Right Eval: WFL Left eval Left  01/22/23 Left  03/03/23 Left  04/14/23 Left  05/28/2023 Left 07/23/2023 Left 08/20/2023 Left 09/17/23  Shoulder flexion   132 100 108 108 108 60(70) 70(78) 70(102)  Shoulder abduction   80 85 85 85 85 65(78) 72(85) 73(94)  Shoulder adduction             Shoulder extension             Shoulder internal rotation             Shoulder external rotation             Elbow flexion   140 140 WFL [facility] WFL  Elbow extension   WNL WNL [facility] WFL WFL  Wrist flexion   65 68        Wrist extension   -10 20 24  32 32 -10 20 20   Wrist ulnar deviation     12 10 14 14 10 18 18   Wrist radial deviation     8 14 14 16 12 14 14   Wrist pronation             Wrist supination             (Blank rows = not tested)   Left digit flexion to [facility]: 2nd: 0cm, 3rd: 0cm, 4th: 0cm, 5th: 0cm   Limited Left full 2nd digit extension     UPPER EXTREMITY MMT:      MMT Right Eval: 4+/5 overall Right 07/23/2023 Left Eval Left 01/22/23 Left 03/03/23 Left  04/14/2023 Left 05/28/2023 Left 07/23/2023 Left 08/20/2023 Left 09/17/23  Shoulder flexion   3+/5 3/5 3-/5 3-/5 3-/5 3-/5 2/5 2+/5 2+/5  Shoulder abduction   3+/5 3-/5 3-/5 3-/5 3-/5 3-/5 2/5 2+/5 2+/5  Shoulder adduction              Shoulder extension              Shoulder internal rotation              Shoulder external rotation              Middle trapezius              Lower trapezius              Elbow flexion   4/5 3+/5 4/5 N/A 4/5 4/5 3+/5 3+/5 3+/5  Elbow extension   4/5 3+/5  4/4 N/A 4/5 4/5 3+/5 3+/5 4-/5  Wrist flexion              Wrist extension   4/5 2-/5 3-/5 3-/5 3/5 3+/5 3-/5 3+/5 3+/5  Wrist ulnar deviation              Wrist radial deviation              Wrist pronation              Wrist supination              (Blank rows = not tested)   HAND FUNCTION: Grip strength: Right: 26#, Left: 10# Pinch strength: Right 8#, Left: 3#, 3 Pt. Pinch strength: Right: 9#, L:  2#  01/22/2023 Grip strength: Right: 26#, Left: 12# Pinch strength:  Pinch meter used at the initial eval has been sent out for recalibration   03/03/2023 Grip strength: Right: 26#, Left: 13# Pinch strength:  Pinch meter used at the initial eval has been sent out for recalibration  04/14/2023: Grip strength: Right: 26#, Left: 13# Pinch strength:   Right 8#, Left: 4#, 3 Pt. Pinch strength: Right: 9#, L: 3#    05/28/2023: Grip strength: Right: 26#, Left: 13# Pinch strength:   Right 8#, Left: 2#, 3 Pt. Pinch strength: Right: 9#, L: 4#  07/23/2023  Grip strength: Right: 5#, Left: 2# Pinch strength:   Right 6#, Left: 2#, 3 Pt. Pinch strength: Right: 5#, L: 2#  08/20/2023:    Grip strength: Right: 8#, Left: 4# Pinch strength:   Right 6#, Left: 2#, 3 Pt. Pinch strength: Right: 5#, L: 2#    09/17/23:  Grip strength: Right: 5#, Left: 4# Pinch strength:   Right 6#, Left: 2#, 3 Pt. Pinch strength: Right: 5#, L: 2#   COORDINATION: Right: 22 sec., Left: <5 min. To place 7 pegs with increased compensation proximally in the trunk, and through reflexive associated reactions.  01/22/23 Right: 22 sec., Left: 3 min. & 4 sec.  03/03/23 Right: 22 sec., Left: 1 min. & 39 sec.  04/14/23   TBD  05/28/23 Right: 22 sec., Left:  5 min.  07/23/2023  Right: 33 sec., Left:  Pt. Is unable to grasp, and place pegs into the pegboard. Pt. Was able to remove 9 vertical pegs in 34 sec.  08/20/2023:  Right: 27 sec., Left:  5 pegs placed in 5 min.; Pt. Was able to remove 9 vertical pegs in 28 sec.  09/17/23:  Right: 26 sec., Left:  3 min, & 49 sec. pegs placed in 5 min.; Pt. Was able to remove 9 vertical pegs in 24 sec.         SENSATION: Light touch: WFL, proprioceptive awareness: Intact   EDEMA: N/A   MUSCLE TONE: LUE: Hypotonic   COGNITION: Overall cognitive status: WFL for tasks assessed. Pt. Is impulsive at times.   VISION: Subjective report: Pt. report having shingles affecting left  eye  s/p 1 year. Has nerve pain Baseline vision: Wears glasses for reading only Visual history:  updated see clinical impression   VISION ASSESSMENT:    WFL for tasks performed   PERCEPTION: Intact   PRAXIS: Impaired: Motor planning   OBSERVATIONS:  Pt. more alert, and engaging since prior to the most recent hospitalization.   TODAY'S TREATMENT:    Measurements were obtained, and goals were reviewed with the Pt.       PATIENT EDUCATION: Education details: LUE therapeutic exercise Person educated: Patient and Spouse Education method:  Explanation and Demonstration Education comprehension: verbalized understanding, returned demonstration, and needs further education   HOME EXERCISE PROGRAM:   Reviewed activities at home to promote isolated 2nd digit extension.    GOALS: Goals reviewed with patient? Yes   SHORT TERM GOALS: Target date: 10/01/2023       1. Patient will be independent with home exercise program for the left upper extremity Baseline: 09/17/23: Independent 08/20/23: Pt. continues to require assist from he husband for HEPs 07/23/2023: Pt. continues to require assist. 05/28/2023: Pt. Requires assist 04/09/2023: Pt. Continues to consistently attempt to engage her hand at hand 02/01/2023: Pt. Consistently attempts to perform HEPs independently. No current home exercise program Goal status: Ongoing   LONG TERM GOALS: Target date:  08/20/2023   1.  Patient will improve left shoulder strength by 2 mm grades to be able to sustain UE's in elevation long enough to wash her hair.  Baseline: 09/17/2023: Left shoulder 2+/5, abduction 2+/5 08/20/2023: Left shoulder flexion 2+/5, abduction 2+/5  07/23/2023: Left shoulder flexion: 2/5, abduction: 2/5. 05/28/2023: Left shoulder flexion: 3-/5, abduction: 3-/5. Pt. has an old left shoulder injury limiting progression with left shoulder strength 05/26/2023: Pt. continues to present with limitiations in sustaining LUE elevation long enough to  perform hair care thoroughly 04/09/2023: Pt. is limited with sustaining LUE elevation long enough to perform hair care thoroughly. 03/03/2023: Left shoulder flexion: 3-/5, abduction: 3-/5 01/22/2023: Left shoulder flexion: 3-/5, abduction: 3-/5 Eval: Left shoulder flexion: 3/5, abduction: 3-/5 Goal status: Ongoing   2.  Patient will improve left shoulder active abduction to be able to comb her hair Baseline: 09/17/2023: 73(94) Pt. is able to reach her left side, and back of hair. 9/11/09/2023: 72(85) Pt. is able to reach her left side, and back of hair. 07/23/2023: Left shoulder flexion: 60(70) abduction: 65(78) 05/28/2023: left shoulder flexion: 108 abduction: 85 Pt. Has an old left shoulder injury limiting progression with left shoulder ROM 05/26/2023:  5/10 left shoulder pain with ROM limits using it functionally during hair care. 04/09/2023: Pt. Presents with difficulty abducting her left shoulder enough to thoroughly complete haircare. 03/03/2023: Shoulder abduction: 85 01/22/2023: Shoulder abduction: 85 Eval: Left shoulder abduction is 80(108) Goal status: Improving, Ongoing   3.  Patient will independently button her shirt with modified independence. Baseline:09/17/2023: Pt. Has a buttonhook, however does not know where it is. Pt. requires increased time to complete, and assist from her husband. 08/20/2023: Pt. requires increased time to complete, and assist from her husband. 07/23/2023 Pt. presents with difficulty buttoning her shirt. 05/28/2023: pt. Continues to work towards progressing with buttoning 05/26/2023:  Pt. Continues to progress towards buttoning. 04/09/2023: Pt. Continues to progress towards buttoning. 03/03/2023: Pt. Continues to have difficulty with buttoning. 01/22/2023: Pt. continues to have difficulty. Eval: Patient has difficulty.  Goal status: Ongoing   4.  Patient well improve left grip strength in preparation for securely hold a glass/beverage Baseline: 09/17/2023: Grip strength: Right: 5#,  Left: 4# Pt. Has difficulty holding a glass. Right: 8#, Left: 4# Pt. Is able to more securely hold mirrors, and flowers with the left hand, however has difficulty holding a glass beverage, or bottle. 08/20/2023: Right: 8#, Left: 4# Pt. Is able to more securely hold mirrors, and flowers with the left hand, however has difficulty holding a glass beverage, or bottle. 07/23/2023: Grip strength: Right: 5#, Left: 2# 05/28/2023: Right: 26#, Left: 13#  pt. Presents with difficulty securely holding flowers in her left hand. 05/26/2023: TBD 04/09/2023: Pt. Is able to hold, and  hike pants with the left hand, continues to have difficulty with securely holding flowers.   03/03/2023: left grip strength: 13# 01/22/2023: Left: 12# Eval: Pt. Is unable to securely hold flowers. Goal status: Ongoing   5.  Pt. will independently recall adaptive  strategies for performing ADL/ADL kitchen tasks.  Baseline: 08/20/2023: Revised to include IADL kitchen tasks.  07/23/2023: Continue 05/28/2023: Pt. Needs continued education about adaptive strategies.05/26/2023: Continue 04/09/2023: Continue3/26/2024: Continue 01/22/2023: Pt. continues to benefit from education about adaptive strategies during ADLs, and IADLs. Eval: Pt. to be provided with adaptive strategies. Goal status: Revised   6.  Pt. will improve FOTO score by 2 points to reflect patient perceived performance improvement assessment specific ADLs  and IADLs Baseline: 84/69/6295: FOTO score: 61 07/23/2023: FOTO score: 57 05/28/2023: FOTO score 59 05/26/2023: TBD 5/02/204:  TBD 03/03/23: FOTO 62 Eval: 57 Goal status: Ongoing  7.  Pt. will improve left hand coordination skills in order to be able to  manipulate small objects.     Baseline: 09/17/2023: Right: 26 sec., Left:  3 min, & 49 sec. pegs placed in 5 min.; Pt. Was able to remove 9 vertical pegs in 24 sec. 08/20/2023: Right: 27 sec., Left:  5 pegs placed in 5 min.; Pt. Was able to remove 9 vertical pegs in 28 sec. Pt. Is able to hold, and  sort utensils in a drawer. Pt. Has difficulty manipulating small objects. 07/23/2023: Right: 33 sec., Left:  Pt. Is unable to grasp, and place pegs into the pegboard. Pt. Was able to remove 9 vertical pegs in 34 sec. 05/28/2023: Left:  5 min. 05/26/2023 Pt. Continues to present with difficulty handling and sorting utensils. 04/14/2023: 56 04/09/2023: TBD 03/03/23: 1 min. & 39 01/22/2022: Left: 3 min. & 4 sec. Eval: Pt. has difficulty sorting, and placing utensils with the left hand. Left FMC : >5 min. For 7 pegs on the 9 hole peg test.    Goal Status: Improving, Revised to be able to improve left hand FMC to be able to manipulate  small objects   8. Pt. will improve active left 2nd digit extension to be able able to isolate her 2nd digit in preparation for pressing/pushing buttons on appliances, phones, or remotes. Baseline: 09/17/2023: Pt. Presents with limited isolated 2nd digit extension with increased time. 08/20/2023: Pt. Is more consistently able to isolate 2nd digit extension with increased time. 07/23/2023: Pt. Continues to work on consistency with isolating left 2nd digit extension 05/28/2023: Pt. Continues to improve with left 2nd digit extension 05/26/2023: Improving with left 2nd digit extension. 04/09/2023: Pt. Is progressing with isolating left 2nd digit extension, however continues to present with limited increased flexor tone. 03/03/23: Pt. Continues to work on improving consistency with 2nd digit extension to press the remote. 01/22/2023: is able to perform full digit extension, although 2nd digit is slow to extend s 2/2 flexor tone. Pt.  Eval: Pt. is able to is unable to actively perform full digit extension Goal status: Ongoing  9: Pt. Will cut food with modified independence    Baseline: 09/17/2023: Pt. Is unable to cut the food on her plate, as well as cut food for meal preparation. 08/20/2023: Pt. Is unable to cut the food on her plate, as well as cut food for meal preparation.    Goal Status:  Ongoing   ASSESSMENT:   CLINICAL IMPRESSION:  Measurements were obtained, and goals were reviewed with the Pt. Pt. Has made progress over this past progress reporting period with ROM with  left shoulder abduction, adduction, left elbow strength, and bilateral [facility] skills. Pt. Has been sick with UTI, weakness, nausea, and vomiting resulting several cancellations this progress reporting period. Pt. Reports that she is now feeling better, and is motivated toesume working towards the goals.  Pt. continues to benefit from skilled OT services to work on improving BUE strength, and coordination skills, as well as left shoulder, elbow, and wrist strength, improving motor control and coordination in order to prepare the left upper extremity and hand for functional reaching and engaging the upper left extremity when performing hair care, buttoning, holding a glass, cutting food, buttoning, as well as adaptive strategies during daily IADL care.     PERFORMANCE DEFICITS in functional skills including ADLs, IADLs, coordination, proprioception, ROM, strength, FMC, and GMC, cognitive skills including memory, and psychosocial skills including coping strategies, environmental adaptation, interpersonal interactions, and routines and behaviors.    IMPAIRMENTS are limiting patient from ADLs, IADLs, education, leisure, and social participation.    COMORBIDITIES may have co-morbidities  that affects occupational performance. Patient will benefit from skilled OT to address above impairments and improve overall function.   MODIFICATION OR ASSISTANCE TO COMPLETE EVALUATION: Min-Moderate modification of tasks or assist with assess necessary to complete an evaluation.   OT OCCUPATIONAL PROFILE AND HISTORY: Detailed assessment: Review of records and additional review of physical, cognitive, psychosocial history related to current functional performance.   CLINICAL DECISION MAKING: Moderate - several treatment options, min-mod  task modification necessary   REHAB POTENTIAL: Good   EVALUATION COMPLEXITY: Moderate      PLAN: OT FREQUENCY: 2x/week   OT DURATION: 12 weeks   PLANNED INTERVENTIONS: self care/ADL training, therapeutic exercise, therapeutic activity, neuromuscular re-education, manual therapy, passive range of motion, functional mobility training, electrical stimulation, and paraffin   RECOMMENDED OTHER SERVICES: PT   CONSULTED AND AGREED WITH PLAN OF CARE: Patient and family member/caregiver   PLAN FOR NEXT SESSION: see above    Olegario Messier, MS, OTR/L  09/08/2023

## 2023-09-17 NOTE — Therapy (Signed)
OUTPATIENT PHYSICAL THERAPY TREATMENT   Patient Name: [name] MRN: [id number] DOB:11/22/1941, 82 y.o., female Today's Date: 09/17/2023   PCP: Aram Beecham, D MD REFERRING PROVIDER: Jacquelynn Cree PA   END OF SESSION:  PT End of Session - 09/17/23 1614     Visit Number 4    Number of Visits 16    Date for PT Re-Evaluation 10/20/23    Authorization Type [employer]:    Authorization Time Period 24 visits approved 04/14/23-09/25/23    Authorization - Number of Visits 24    Progress Note Due on Visit 10    PT Start Time 1615    PT Stop Time 1655    PT Time Calculation (min) 40 min    Equipment Utilized During Treatment Gait belt    Activity Tolerance Patient tolerated treatment well    Behavior During Therapy WFL for tasks assessed/performed                 Past Medical History:  Diagnosis Date   Anemia    Arthritis    Asthma    uses inhaler just prior to surgery to avoid attack   Back pain    from previous injury   Complication of anesthesia    has woken  up during 2 different surgery   Depression    no current issue/treatment; situation   Gallstones    GERD (gastroesophageal reflux disease)    Hiatal hernia    patient does NOT have nerve/muscle disease   History of kidney stones    HLD (hyperlipidemia)    HTN (hypertension)    Hypothyroidism    Kidney stones    Knee pain    Nausea and vomiting 10/15/2022   Non-diabetic pancreatic hormone dysfunction years   pt. 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   Pancreatitis    Pneumonia    Seizures (HCC)    caused by dye injected during a procedure   Shortness of breath    with exertion   Sinus problem    frequent infections/congestion   Stroke (HCC) 2021   reports having CVA in 2021 and having mini strokes before that   Thyroid disease    Past Surgical History:  Procedure Laterality Date   ABDOMINAL HYSTERECTOMY     APPENDECTOMY     CARPAL TUNNEL RELEASE  10+ years ago   bilateral    EYE SURGERY  3 yrs ago   bilateral cataracts   FOOT OSTEOTOMY  6 weeks ago   Left foot: great, 2nd & 3rd   FOOT OSTEOTOMY  5 years ago   Right great toe   HAND SURGERY Bilateral 2011-most recent   multiple hand surgeries, 2 on left, 3 on right   KNEE ARTHROPLASTY Right 04/28/2022   Procedure: COMPUTER ASSISTED TOTAL KNEE ARTHROPLASTY;  Surgeon: Donato Heinz, MD;  Location: [facility];  Service: Orthopedics;  Laterality: Right;   LOOP RECORDER INSERTION N/A 05/16/2020   Procedure: LOOP RECORDER INSERTION;  Surgeon: Marcina Millard, MD;  Location: [facility];  Service: Cardiovascular;  Laterality: N/A;   NASAL SINUS SURGERY  most recent 7-8 yrs ago   7 sinus surgeries    TRIGGER FINGER RELEASE  11/19/2011   Procedure: RELEASE TRIGGER FINGER/A-1 PULLEY;  Surgeon: Nicki Reaper, MD;  Location: [facility];  Service: Orthopedics;  Laterality: Right;  release a-1 pulley right index finger and cyst removal   WRIST GANGLION EXCISION  1980's   right  ONSET DATE: 11/23/22  REFERRING DIAG: TIA  THERAPY DIAG:  Muscle weakness (generalized)  Other lack of coordination  Difficulty in walking, not elsewhere classified  Unsteadiness on feet  Rationale for Evaluation and Treatment: Rehabilitation  SUBJECTIVE:                                                                                                                                                                                             SUBJECTIVE STATEMENT:   Pt reports that she is doing much better today. Reports that she doesn't want to take any more UTI medicine because it makes her have diarrhemia.    Pt accompanied by: significant other  PERTINENT HISTORY: [name] is an 82 year-old female who was admitted to [facility] on 11/23/2022 with a cerebellar CVA. 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 Pt. Was admitted to Inpatient Rehab from 12/21-1/02/2023. 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  Patient underwent inpatient rehabilitation for 2 weeks.  Patient was assessed and was scheduled for a knee replacement on 08/29/2022 however had to cancel it due to having had a CVA. PMH includes: anemia, arthritis, back pain, depression, hernia, GERD, HLD, HTN, hypothyroidism, knee pain, pancreatic hormone dysfunction, pneumonia, seizures, stroke, thyroid disease.  Patient has a donjoy brace for L knee.   PAIN:  Are you having pain? Yes, only with end range flexion and STS.    PRECAUTIONS: Fall, has latex allergy  WEIGHT BEARING RESTRICTIONS: No  FALLS: Has patient fallen in last 6 months? Yes. Number of falls 4 falls   LIVING ENVIRONMENT: Lives with: lives with their family and lives with their spouse Lives in: House/apartment Stairs: Yes: Internal: flight steps;   and External: 2 steps; on right going up and on left going up Has following equipment at home: Single point cane, Walker - 2 wheeled, Shower bench, and bed side commode  PLOF: Independent  PATIENT GOALS: to have less pain and move better  OBJECTIVE:   TODAY'S TREATMENT:  DATE: 09/17/23  TE LAQ 3x10 each LE - Rates medium  Seated march 3x10-12 Hip abduction RTB LE push down RTB x 12 bil  Reciprocal hip flexion RTB x 12    Sit<>stand with CGA and RW.  Sitting BP: 114/63 HR 83 Standing: 97/74 HR 82  Sitting: 116/71 HE 76  Sit<>stand x 5 with 1 UE supported on RW  Standing march 2 x 5 bil with BUE support on RW   Gait with RW x 69ft and 59ft + 62ft with CGA and min cues for posture and attention to task. No dizziness or nausea in standing    Only mild Knee pain reported after longer gait of 2ft.   PATIENT EDUCATION: Education details: instructed pt and her spouse to call physician about UTI  symptoms/antibiotics   HOME EXERCISE PROGRAM:  Pending visit 2   GOALS: Goals reviewed with patient? Yes  SHORT TERM GOALS: Target date: 09/19/23  Patient will be independent in starter HEP to improve A/ROM tolerance and preserve available ROM of LLE and to advance A/ROM and strength of RLE.  Baseline: 08/20/23: defer to vists 2-3  Goal status: NEW  LONG TERM GOALS: Target date: 10/20/23  Patient will increase FOTO score to equal to or greater than 65%  to demonstrate statistically significant improvement in ease of mobility.  Baseline:  47%; 06/23/23: 60;   7/16: 60%  Goal status: NEW   2.  Patient (> 73 years old) will complete five times sit to stand test in < 15 seconds indicating an increased RLE power.  Baseline: 5/7: unable to tolerate ; 7/16: 17.4sec Goal status: NEW  3.   Patient to report tolerance of ad lib AMB in home during the day of distances >17ft >5x at supervision level or better to improve independence with ADL.    Baseline: 08/20/23: using transport chair for nearly all mobility, not tolerating walking due to WB limitations of LLE  Goal status: NEW    ASSESSMENT:  CLINICAL IMPRESSION:   Pt shows significant progress this session,continues to demonstrate improved tolerance to full ROM and standing with reduced pain and reduced nausea compared to prior sessions with this PT.  The patient will benefit from skilled physical therapy to reduce pain, improve mobility, improve strength and improve quality of life.     OBJECTIVE IMPAIRMENTS: Abnormal gait, cardiopulmonary status limiting activity, decreased activity tolerance, decreased balance, decreased cognition, decreased endurance, decreased knowledge of use of DME, decreased mobility, difficulty walking, decreased ROM, decreased strength, decreased safety awareness, hypomobility, increased fascial restrictions, impaired perceived functional ability, impaired flexibility, impaired UE functional use, improper body  mechanics, postural dysfunction, and pain.   ACTIVITY LIMITATIONS: carrying, lifting, bending, sitting, standing, squatting, stairs, transfers, bed mobility, bathing, toileting, dressing, reach over head, hygiene/grooming, locomotion level, and caring for others  PARTICIPATION LIMITATIONS: meal prep, cleaning, laundry, medication management, personal finances, interpersonal relationship, driving, shopping, community activity, yard work, school, and church  PERSONAL FACTORS: Age, Behavior pattern, Education, Fitness, Past/current experiences, Time since onset of injury/illness/exacerbation, and 3+ comorbidities: anemia, arthritis, back pain, depression, hernia, GERD, HLD, HTN, hypothyroidism, knee pain, pancreatic hormone dysfunction, pneumonia, seizures, stroke, thyroid disease  are also affecting patient's functional outcome.   REHAB POTENTIAL: Fair    CLINICAL DECISION MAKING: Evolving/moderate complexity  EVALUATION COMPLEXITY: Moderate  PLAN:  PT FREQUENCY: 1-2x/week  PT DURATION: 12 weeks  PLANNED INTERVENTIONS: Therapeutic exercises, Therapeutic activity, Neuromuscular re-education, Balance training, Gait training, Patient/Family education, Self Care, Joint mobilization, Joint manipulation, Stair training, Vestibular training,  Canalith repositioning, Visual/preceptual remediation/compensation, Orthotic/Fit training, DME instructions, Electrical stimulation, Wheelchair mobility training, Spinal mobilization, Cryotherapy, Moist heat, Compression bandaging, scar mobilization, Splintting, Taping, Traction, Ultrasound, Manual therapy, and Re-evaluation  PLAN FOR NEXT SESSION:  Focus to maximizing strength and functional compensation of RLE and BUE for modified household AMB and transfers with RW.    Golden Pop, PT, DPT 09/17/2023, 4:15 PM   4:15 PM, 09/17/23  Physical Therapist - [facility]  [phone] [facility])

## 2023-09-22 ENCOUNTER — Ambulatory Visit: Payer: Medicare HMO | Admitting: Physical Therapy

## 2023-09-22 ENCOUNTER — Ambulatory Visit: Payer: Medicare HMO | Admitting: Occupational Therapy

## 2023-09-22 DIAGNOSIS — R262 Difficulty in walking, not elsewhere classified: Secondary | ICD-10-CM | POA: Diagnosis not present

## 2023-09-22 DIAGNOSIS — M6281 Muscle weakness (generalized): Secondary | ICD-10-CM

## 2023-09-22 DIAGNOSIS — R531 Weakness: Secondary | ICD-10-CM | POA: Diagnosis not present

## 2023-09-22 DIAGNOSIS — R278 Other lack of coordination: Secondary | ICD-10-CM

## 2023-09-22 DIAGNOSIS — I6381 Other cerebral infarction due to occlusion or stenosis of small artery: Secondary | ICD-10-CM | POA: Diagnosis not present

## 2023-09-22 DIAGNOSIS — R2681 Unsteadiness on feet: Secondary | ICD-10-CM | POA: Diagnosis not present

## 2023-09-22 NOTE — Therapy (Unsigned)
OUTPATIENT PHYSICAL THERAPY TREATMENT   Patient Name: [name] MRN: [id number] DOB:1941-09-23, 82 y.o., female Today's Date: 09/22/2023   PCP: Aram Beecham, D MD REFERRING PROVIDER: Jacquelynn Cree PA   END OF SESSION:  PT End of Session - 09/22/23 1617     Visit Number 5    Number of Visits 16    Date for PT Re-Evaluation 10/20/23    Authorization Type [employer]:    Authorization Time Period 24 visits approved 04/14/23-09/25/23    Authorization - Number of Visits 24    Progress Note Due on Visit 10    PT Start Time 1617    PT Stop Time 1700    PT Time Calculation (min) 43 min    Equipment Utilized During Treatment Gait belt    Activity Tolerance Patient tolerated treatment well    Behavior During Therapy WFL for tasks assessed/performed                 Past Medical History:  Diagnosis Date   Anemia    Arthritis    Asthma    uses inhaler just prior to surgery to avoid attack   Back pain    from previous injury   Complication of anesthesia    has woken  up during 2 different surgery   Depression    no current issue/treatment; situation   Gallstones    GERD (gastroesophageal reflux disease)    Hiatal hernia    patient does NOT have nerve/muscle disease   History of kidney stones    HLD (hyperlipidemia)    HTN (hypertension)    Hypothyroidism    Kidney stones    Knee pain    Nausea and vomiting 10/15/2022   Non-diabetic pancreatic hormone dysfunction years   pt. 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   Pancreatitis    Pneumonia    Seizures (HCC)    caused by dye injected during a procedure   Shortness of breath    with exertion   Sinus problem    frequent infections/congestion   Stroke (HCC) 2021   reports having CVA in 2021 and having mini strokes before that   Thyroid disease    Past Surgical History:  Procedure Laterality Date   ABDOMINAL HYSTERECTOMY     APPENDECTOMY     CARPAL TUNNEL RELEASE  10+ years ago   bilateral    EYE SURGERY  3 yrs ago   bilateral cataracts   FOOT OSTEOTOMY  6 weeks ago   Left foot: great, 2nd & 3rd   FOOT OSTEOTOMY  5 years ago   Right great toe   HAND SURGERY Bilateral 2011-most recent   multiple hand surgeries, 2 on left, 3 on right   KNEE ARTHROPLASTY Right 04/28/2022   Procedure: COMPUTER ASSISTED TOTAL KNEE ARTHROPLASTY;  Surgeon: Donato Heinz, MD;  Location: [facility];  Service: Orthopedics;  Laterality: Right;   LOOP RECORDER INSERTION N/A 05/16/2020   Procedure: LOOP RECORDER INSERTION;  Surgeon: Marcina Millard, MD;  Location: [facility];  Service: Cardiovascular;  Laterality: N/A;   NASAL SINUS SURGERY  most recent 7-8 yrs ago   7 sinus surgeries    TRIGGER FINGER RELEASE  11/19/2011   Procedure: RELEASE TRIGGER FINGER/A-1 PULLEY;  Surgeon: Nicki Reaper, MD;  Location: [facility];  Service: Orthopedics;  Laterality: Right;  release a-1 pulley right index finger and cyst removal   WRIST GANGLION EXCISION  1980's   right  ONSET DATE: 11/23/22  REFERRING DIAG: TIA  THERAPY DIAG:  Muscle weakness (generalized)  Other lack of coordination  Difficulty in walking, not elsewhere classified  Unsteadiness on feet  Acute left-sided weakness  Rationale for Evaluation and Treatment: Rehabilitation  SUBJECTIVE:                                                                                                                                                                                             SUBJECTIVE STATEMENT:   Pt reports that she is doing much better today. Husband reports that she is walking more at home with decreased pain. Was able to get out of the house several times over the weekend.   Pt accompanied by: significant other  PERTINENT HISTORY: [name] is an 8 year-old female who was admitted to [facility] on 11/23/2022 with a cerebellar CVA. 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 Pt.  Was admitted to Inpatient Rehab from 12/21-1/02/2023. 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  Patient underwent inpatient rehabilitation for 2 weeks.  Patient was assessed and was scheduled for a knee replacement on 08/29/2022 however had to cancel it due to having had a CVA. PMH includes: anemia, arthritis, back pain, depression, hernia, GERD, HLD, HTN, hypothyroidism, knee pain, pancreatic hormone dysfunction, pneumonia, seizures, stroke, thyroid disease.  Patient has a donjoy brace for L knee.   PAIN:  Are you having pain? Yes, only with end range flexion and STS.    PRECAUTIONS: Fall, has latex allergy  WEIGHT BEARING RESTRICTIONS: No  FALLS: Has patient fallen in last 6 months? Yes. Number of falls 4 falls   LIVING ENVIRONMENT: Lives with: lives with their family and lives with their spouse Lives in: House/apartment Stairs: Yes: Internal: flight steps;   and External: 2 steps; on right going up and on left going up Has following equipment at home: Single point cane, Walker - 2 wheeled, Shower bench, and bed side commode  PLOF: Independent  PATIENT GOALS: to have less pain and move better  OBJECTIVE:   TODAY'S TREATMENT:  DATE: 09/22/23  TE LAQ 3x10 each LE - Rates medium  Seated march 3x10-12 Hip abduction RTB LE push down YTB x 12 bil  Reciprocal hip flexion YTB x 12    Sit<>stand with CGA and RW.  Sitting BP: 114/63 HR 83 Standing: 97/74 HR 82  Sitting: 116/71 HE 76  Sit<>stand x 5 with 1 UE supported on RW  Standing march 2 x 5 bil with BUE support on RW   Gait with RW x 1ft and 79ft + 69ft with CGA and min cues for posture and attention to task. No dizziness or nausea in standing    Only mild Knee pain reported after longer gait of 19ft.   PATIENT EDUCATION: Education details: instructed  pt and her spouse to call physician about UTI symptoms/antibiotics   HOME EXERCISE PROGRAM:  Pending visit 2   GOALS: Goals reviewed with patient? Yes  SHORT TERM GOALS: Target date: 09/19/23  Patient will be independent in starter HEP to improve A/ROM tolerance and preserve available ROM of LLE and to advance A/ROM and strength of RLE.  Baseline: 08/20/23: defer to vists 2-3  Goal status: NEW  LONG TERM GOALS: Target date: 10/20/23  Patient will increase FOTO score to equal to or greater than 65%  to demonstrate statistically significant improvement in ease of mobility.  Baseline:  47%; 06/23/23: 60;   7/16: 60%  Goal status: NEW   2.  Patient (> 42 years old) will complete five times sit to stand test in < 15 seconds indicating an increased RLE power.  Baseline: 5/7: unable to tolerate ; 7/16: 17.4sec Goal status: NEW  3.   Patient to report tolerance of ad lib AMB in home during the day of distances >24ft >5x at supervision level or better to improve independence with ADL.    Baseline: 08/20/23: using transport chair for nearly all mobility, not tolerating walking due to WB limitations of LLE  Goal status: NEW    ASSESSMENT:  CLINICAL IMPRESSION:   Pt shows significant progress this session,continues to demonstrate improved tolerance to full ROM and standing with reduced pain and reduced nausea compared to prior sessions with this PT.  The patient will benefit from skilled physical therapy to reduce pain, improve mobility, improve strength and improve quality of life.     OBJECTIVE IMPAIRMENTS: Abnormal gait, cardiopulmonary status limiting activity, decreased activity tolerance, decreased balance, decreased cognition, decreased endurance, decreased knowledge of use of DME, decreased mobility, difficulty walking, decreased ROM, decreased strength, decreased safety awareness, hypomobility, increased fascial restrictions, impaired perceived functional ability, impaired flexibility,  impaired UE functional use, improper body mechanics, postural dysfunction, and pain.   ACTIVITY LIMITATIONS: carrying, lifting, bending, sitting, standing, squatting, stairs, transfers, bed mobility, bathing, toileting, dressing, reach over head, hygiene/grooming, locomotion level, and caring for others  PARTICIPATION LIMITATIONS: meal prep, cleaning, laundry, medication management, personal finances, interpersonal relationship, driving, shopping, community activity, yard work, school, and church  PERSONAL FACTORS: Age, Behavior pattern, Education, Fitness, Past/current experiences, Time since onset of injury/illness/exacerbation, and 3+ comorbidities: anemia, arthritis, back pain, depression, hernia, GERD, HLD, HTN, hypothyroidism, knee pain, pancreatic hormone dysfunction, pneumonia, seizures, stroke, thyroid disease  are also affecting patient's functional outcome.   REHAB POTENTIAL: Fair    CLINICAL DECISION MAKING: Evolving/moderate complexity  EVALUATION COMPLEXITY: Moderate  PLAN:  PT FREQUENCY: 1-2x/week  PT DURATION: 12 weeks  PLANNED INTERVENTIONS: Therapeutic exercises, Therapeutic activity, Neuromuscular re-education, Balance training, Gait training, Patient/Family education, Self Care, Joint mobilization, Joint manipulation, Stair training, Vestibular training,  Canalith repositioning, Visual/preceptual remediation/compensation, Orthotic/Fit training, DME instructions, Electrical stimulation, Wheelchair mobility training, Spinal mobilization, Cryotherapy, Moist heat, Compression bandaging, scar mobilization, Splintting, Taping, Traction, Ultrasound, Manual therapy, and Re-evaluation  PLAN FOR NEXT SESSION:  Focus to maximizing strength and functional compensation of RLE and BUE for modified household AMB and transfers with RW.    Golden Pop, PT, DPT 09/22/2023, 4:18 PM   4:18 PM, 09/22/23  Physical Therapist - [facility]   [phone] [facility])

## 2023-09-23 NOTE — Therapy (Signed)
Occupational Therapy Neuro Treatment Note    Patient Name: [name] MRN: [id number] DOB:05/17/1941, 82 y.o., female Today's Date: 09/23/2023  PCP: Dr. Judithann Sheen REFERRING PROVIDER:  Dr. Judithann Sheen  END OF SESSION:  PLAN:   OT End of Session - 09/23/23 1139     Visit Number 61    Number of Visits 96    Date for OT Re-Evaluation 11/12/23    OT Start Time 1535    OT Stop Time 1615    OT Time Calculation (min) 40 min    Activity Tolerance Patient tolerated treatment well    Behavior During Therapy WFL for tasks assessed/performed                   Past Medical History:  Diagnosis Date   Anemia    Arthritis    Asthma    uses inhaler just prior to surgery to avoid attack   Back pain    from previous injury   Complication of anesthesia    has woken  up during 2 different surgery   Depression    no current issue/treatment; situation   Gallstones    GERD (gastroesophageal reflux disease)    Hiatal hernia    patient does NOT have nerve/muscle disease   History of kidney stones    HLD (hyperlipidemia)    HTN (hypertension)    Hypothyroidism    Kidney stones    Knee pain    Nausea and vomiting 10/15/2022   Non-diabetic pancreatic hormone dysfunction years   pt. 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   Pancreatitis    Pneumonia    Seizures (HCC)    caused by dye injected during a procedure   Shortness of breath    with exertion   Sinus problem    frequent infections/congestion   Stroke (HCC) 2021   reports having CVA in 2021 and having mini strokes before that   Thyroid disease    Past Surgical History:  Procedure Laterality Date   ABDOMINAL HYSTERECTOMY     APPENDECTOMY     CARPAL TUNNEL RELEASE  10+ years ago   bilateral   EYE SURGERY  3 yrs ago   bilateral cataracts   FOOT OSTEOTOMY  6 weeks ago   Left foot: great, 2nd & 3rd   FOOT OSTEOTOMY  5 years ago   Right great toe   HAND SURGERY Bilateral 2011-most recent   multiple hand surgeries, 2  on left, 3 on right   KNEE ARTHROPLASTY Right 04/28/2022   Procedure: COMPUTER ASSISTED TOTAL KNEE ARTHROPLASTY;  Surgeon: Donato Heinz, MD;  Location: [facility];  Service: Orthopedics;  Laterality: Right;   LOOP RECORDER INSERTION N/A 05/16/2020   Procedure: LOOP RECORDER INSERTION;  Surgeon: Marcina Millard, MD;  Location: [facility];  Service: Cardiovascular;  Laterality: N/A;   NASAL SINUS SURGERY  most recent 7-8 yrs ago   7 sinus surgeries    TRIGGER FINGER RELEASE  11/19/2011   Procedure: RELEASE TRIGGER FINGER/A-1 PULLEY;  Surgeon: Nicki Reaper, MD;  Location: [facility];  Service: Orthopedics;  Laterality: Right;  release a-1 pulley right index finger and cyst removal   WRIST GANGLION EXCISION  1980's   right   Patient Active Problem List   Diagnosis Date Noted   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 11/27/2022   Hypomagnesemia 11/27/2022   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   HLD (hyperlipidemia) 11/23/2022   Asthma 11/23/2022   Depression with  anxiety 11/23/2022   Chronic diastolic CHF (congestive heart failure) (HCC) 11/23/2022   Normocytic anemia 11/23/2022   Aspiration pneumonia (HCC) 11/23/2022   AKI (acute kidney injury) (HCC) 11/23/2022   Abdominal pain 11/23/2022   Mesenteric mass 11/23/2022   Coffee ground emesis 11/23/2022   Nausea and vomiting 10/15/2022   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   Fatigue 09/17/2022   Left hemiparesis (HCC) 09/17/2022   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HCC) 08/22/2022   GERD (gastroesophageal reflux disease) 08/21/2022   Agitation 08/20/2022   Acute left-sided weakness 08/20/2022   Expressive aphasia    Stroke (HCC) 08/19/2022   Leukocytosis 08/19/2022   History of urticaria 04/28/2022   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   Primary osteoarthritis of left knee 02/24/2022   Primary osteoarthritis of right knee 02/24/2022   Lumbar spondylolysis 04/12/2020   History of CVA  (cerebrovascular accident) 03/26/2020   Low back pain radiating to right lower extremity 03/21/2020   B12 deficiency 03/06/2020   Positive anti-CCP test 12/21/2019   Arthralgia 12/13/2019   Dermatitis 12/13/2019   Rheumatoid factor positive 12/13/2019   Essential hypertension 12/11/2018   Palpitations 12/11/2018   Acquired hypothyroidism 11/10/2018   Arthritis of knee 09/17/2016   Anxiety 11/22/2014   Asthma without status asthmaticus 11/22/2014   Benign neoplasm of colon, unspecified 11/22/2014   Environmental allergies 11/22/2014   Hypertriglyceridemia 11/22/2014   Hypokalemia 11/22/2014   Personal history of disease of skin and subcutaneous tissue 11/22/2014   REFERRING DIAG: CVA   THERAPY DIAG:  Muscle weakness (generalized)   Other lack of coordination   Rationale for Evaluation and Treatment Rehabilitation   SUBJECTIVE:   SUBJECTIVE STATEMENT:  Pt. reports feeling better today  Pt accompanied by: significant other   PERTINENT HISTORY: Patient is an 82 year-old female who was admitted to [facility] on 11/23/2022 with a cerebellar CVA. 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 Pt. Was admitted to Inpatient Rehab from 12/21-1/02/2023. 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  Patient underwent inpatient rehabilitation for 2 weeks.  Patient was assessed and was scheduled for a knee replacement on 08/29/2022 however had to cancel it due to having had a CVA.  Patient had a recent fall 2 days after discharging from inpatient rehab. Past Medical History includes: Knee replacement, essential HTN, hypokalemia, leukocytosis, seizures, positive anti-- CCP test, anxiety disorder, mini strokes.  Patient had shingles with left eye nerve pain s/p 1 year ago.    PRECAUTIONS: Fall   WEIGHT BEARING RESTRICTIONS No   PAIN:  Are you having pain?  No   FALLS: Has patient fallen in last 6 months? Yes.  Number of falls 1   LIVING ENVIRONMENT: Lives with: Lives with Spouse Lives in: House/apartment Stairs: 2 storey home, resides on the first floor.  External: 2 stairs front no rails, and 6 in back with rails Has following equipment at home: Single point cane, Walker - 2 wheeled, Environmental consultant - 4 wheeled, Shower bench, and bed side commode   PLOF: Independent   PATIENT GOALS  To Regain the use of her left arm   OBJECTIVE:    HAND DOMINANCE: Right   ADLs: Overall ADLs: Husband assists pt. as needed Transfers/ambulation related to ADLs:Pt. Uses a 3 wheeled walker with Husband assist. Eating: Pt. Is independent with the right hand. Pt. has difficulty cutting food. Grooming: Pt. is using her right hand, however has difficulty sustaining her LUE in elevation to assist with haircare. UB Dressing: Pt.  Is independent donning a pullover shirt, and button down shirt. Has difficulty with buttoning, LB Dressing:  Independent donning pants, and socks. Difficulty tying shoes. Toileting: Independent Bathing: Pt. Is able to engage her right hand. Tub Shower transfers: Supervision Equipment: See above for equipment    IADLs: Shopping:  Has not had the opportunity for grocery shopping yet Light housekeeping: Husband is assisting with light house keeping Meal Prep:  Dependent Community mobility: Relies of family/friends Medication management: Husband assisting with weekly pillbox set-up, and administering medication. Financial management: TBD Handwriting: 75% legible   MOBILITY STATUS: Hx of falls   POSTURE COMMENTS:  No Significant postural limitations Sitting balance: supported sitting balance WFL   ACTIVITY TOLERANCE: Activity tolerance:  Fatigues in greater than 30 min.    FUNCTIONAL OUTCOME MEASURES: FOTO: 57   UPPER EXTREMITY ROM      Active ROM Right Eval: WFL Left eval Left  01/22/23 Left  03/03/23 Left  04/14/23 Left  05/28/2023 Left 07/23/2023 Left 08/20/2023 Left 09/17/23   Shoulder flexion   132 100 108 108 108 60(70) 70(78) 70(102)  Shoulder abduction   80 85 85 85 85 65(78) 72(85) 73(94)  Shoulder adduction             Shoulder extension             Shoulder internal rotation             Shoulder external rotation             Elbow flexion   140 140 WFL [facility] WFL  Elbow extension   WNL WNL [facility] WFL WFL  Wrist flexion   65 68        Wrist extension   -10 20 24  32 32 -10 20 20   Wrist ulnar deviation     12 10 14 14 10 18 18   Wrist radial deviation     8 14 14 16 12 14 14   Wrist pronation             Wrist supination             (Blank rows = not tested)   Left digit flexion to [facility]: 2nd: 0cm, 3rd: 0cm, 4th: 0cm, 5th: 0cm   Limited Left full 2nd digit extension     UPPER EXTREMITY MMT:      MMT Right Eval: 4+/5 overall Right 07/23/2023 Left Eval Left 01/22/23 Left 03/03/23 Left  04/14/2023 Left 05/28/2023 Left 07/23/2023 Left 08/20/2023 Left 09/17/23  Shoulder flexion   3+/5 3/5 3-/5 3-/5 3-/5 3-/5 2/5 2+/5 2+/5  Shoulder abduction   3+/5 3-/5 3-/5 3-/5 3-/5 3-/5 2/5 2+/5 2+/5  Shoulder adduction              Shoulder extension              Shoulder internal rotation              Shoulder external rotation              Middle trapezius              Lower trapezius              Elbow flexion   4/5 3+/5 4/5 N/A 4/5 4/5 3+/5 3+/5 3+/5  Elbow extension   4/5 3+/5 4/4 N/A 4/5 4/5 3+/5 3+/5 4-/5  Wrist flexion              Wrist extension  4/5 2-/5 3-/5 3-/5 3/5 3+/5 3-/5 3+/5 3+/5  Wrist ulnar deviation              Wrist radial deviation              Wrist pronation              Wrist supination              (Blank rows = not tested)   HAND FUNCTION: Grip strength: Right: 26#, Left: 10# Pinch strength: Right 8#, Left: 3#, 3 Pt. Pinch strength: Right: 9#, L: 2#  01/22/2023 Grip strength: Right: 26#, Left: 12# Pinch strength:  Pinch meter used at the initial eval has been sent out for recalibration   03/03/2023 Grip  strength: Right: 26#, Left: 13# Pinch strength:  Pinch meter used at the initial eval has been sent out for recalibration  04/14/2023: Grip strength: Right: 26#, Left: 13# Pinch strength:   Right 8#, Left: 4#, 3 Pt. Pinch strength: Right: 9#, L: 3#    05/28/2023: Grip strength: Right: 26#, Left: 13# Pinch strength:   Right 8#, Left: 2#, 3 Pt. Pinch strength: Right: 9#, L: 4#  07/23/2023  Grip strength: Right: 5#, Left: 2# Pinch strength:   Right 6#, Left: 2#, 3 Pt. Pinch strength: Right: 5#, L: 2#  08/20/2023:    Grip strength: Right: 8#, Left: 4# Pinch strength:   Right 6#, Left: 2#, 3 Pt. Pinch strength: Right: 5#, L: 2#    09/17/23:  Grip strength: Right: 5#, Left: 4# Pinch strength:   Right 6#, Left: 2#, 3 Pt. Pinch strength: Right: 5#, L: 2#   COORDINATION: Right: 22 sec., Left: <5 min. To place 7 pegs with increased compensation proximally in the trunk, and through reflexive associated reactions.  01/22/23 Right: 22 sec., Left: 3 min. & 4 sec.  03/03/23 Right: 22 sec., Left: 1 min. & 39 sec.  04/14/23   TBD  05/28/23 Right: 22 sec., Left:  5 min.  07/23/2023  Right: 33 sec., Left:  Pt. Is unable to grasp, and place pegs into the pegboard. Pt. Was able to remove 9 vertical pegs in 34 sec.  08/20/2023:  Right: 27 sec., Left:  5 pegs placed in 5 min.; Pt. Was able to remove 9 vertical pegs in 28 sec.  09/17/23:  Right: 26 sec., Left:  3 min, & 49 sec. pegs placed in 5 min.; Pt. Was able to remove 9 vertical pegs in 24 sec.         SENSATION: Light touch: WFL, proprioceptive awareness: Intact   EDEMA: N/A   MUSCLE TONE: LUE: Hypotonic   COGNITION: Overall cognitive status: WFL for tasks assessed. Pt. Is impulsive at times.   VISION: Subjective report: Pt. report having shingles affecting left eye  s/p 1 year. Has nerve pain Baseline vision: Wears glasses for reading only Visual history:  updated see clinical impression   VISION ASSESSMENT:    WFL for  tasks performed   PERCEPTION: Intact   PRAXIS: Impaired: Motor planning   OBSERVATIONS:  Pt. more alert, and engaging since prior to the most recent hospitalization.   TODAY'S TREATMENT:    There. Ex.:   Pt. Tolerated PROM/AAROM to the Left shoulder, elbow, wrist, and digits. Pt. worked on [employer], and reciprocal motion using the UBE while seated for 8 min. with no resistance. Constant monitoring was provided.   Neuromuscular re-education:  Pt. worked on using a 2pt. pinch for grasping single width clips, and placing  them onto a small horizontal dowel rod.            PATIENT EDUCATION: Education details: LUE therapeutic exercise Person educated: Patient and Spouse Education method: Medical illustrator Education comprehension: verbalized understanding, returned demonstration, and needs further education   HOME EXERCISE PROGRAM:   Reviewed activities at home to promote isolated 2nd digit extension.    GOALS: Goals reviewed with patient? Yes   SHORT TERM GOALS: Target date: 10/01/2023       1. Patient will be independent with home exercise program for the left upper extremity Baseline: 09/17/23: Independent 08/20/23: Pt. continues to require assist from he husband for HEPs 07/23/2023: Pt. continues to require assist. 05/28/2023: Pt. Requires assist 04/09/2023: Pt. Continues to consistently attempt to engage her hand at hand 02/01/2023: Pt. Consistently attempts to perform HEPs independently. No current home exercise program Goal status: Ongoing   LONG TERM GOALS: Target date:  08/20/2023   1.  Patient will improve left shoulder strength by 2 mm grades to be able to sustain UE's in elevation long enough to wash her hair.  Baseline: 09/17/2023: Left shoulder 2+/5, abduction 2+/5 08/20/2023: Left shoulder flexion 2+/5, abduction 2+/5  07/23/2023: Left shoulder flexion: 2/5, abduction: 2/5. 05/28/2023: Left shoulder flexion: 3-/5, abduction: 3-/5. Pt. has an old left  shoulder injury limiting progression with left shoulder strength 05/26/2023: Pt. continues to present with limitiations in sustaining LUE elevation long enough to perform hair care thoroughly 04/09/2023: Pt. is limited with sustaining LUE elevation long enough to perform hair care thoroughly. 03/03/2023: Left shoulder flexion: 3-/5, abduction: 3-/5 01/22/2023: Left shoulder flexion: 3-/5, abduction: 3-/5 Eval: Left shoulder flexion: 3/5, abduction: 3-/5 Goal status: Ongoing   2.  Patient will improve left shoulder active abduction to be able to comb her hair Baseline: 09/17/2023: 73(94) Pt. is able to reach her left side, and back of hair. 9/11/09/2023: 72(85) Pt. is able to reach her left side, and back of hair. 07/23/2023: Left shoulder flexion: 60(70) abduction: 65(78) 05/28/2023: left shoulder flexion: 108 abduction: 85 Pt. Has an old left shoulder injury limiting progression with left shoulder ROM 05/26/2023:  5/10 left shoulder pain with ROM limits using it functionally during hair care. 04/09/2023: Pt. Presents with difficulty abducting her left shoulder enough to thoroughly complete haircare. 03/03/2023: Shoulder abduction: 85 01/22/2023: Shoulder abduction: 85 Eval: Left shoulder abduction is 80(108) Goal status: Improving, Ongoing   3.  Patient will independently button her shirt with modified independence. Baseline:09/17/2023: Pt. Has a buttonhook, however does not know where it is. Pt. requires increased time to complete, and assist from her husband. 08/20/2023: Pt. requires increased time to complete, and assist from her husband. 07/23/2023 Pt. presents with difficulty buttoning her shirt. 05/28/2023: pt. Continues to work towards progressing with buttoning 05/26/2023:  Pt. Continues to progress towards buttoning. 04/09/2023: Pt. Continues to progress towards buttoning. 03/03/2023: Pt. Continues to have difficulty with buttoning. 01/22/2023: Pt. continues to have difficulty. Eval: Patient has difficulty.  Goal  status: Ongoing   4.  Patient well improve left grip strength in preparation for securely hold a glass/beverage Baseline: 09/17/2023: Grip strength: Right: 5#, Left: 4# Pt. Has difficulty holding a glass. Right: 8#, Left: 4# Pt. Is able to more securely hold mirrors, and flowers with the left hand, however has difficulty holding a glass beverage, or bottle. 08/20/2023: Right: 8#, Left: 4# Pt. Is able to more securely hold mirrors, and flowers with the left hand, however has difficulty holding a glass beverage, or bottle. 07/23/2023:  Grip strength: Right: 5#, Left: 2# 05/28/2023: Right: 26#, Left: 13#  pt. Presents with difficulty securely holding flowers in her left hand. 05/26/2023: TBD 04/09/2023: Pt. Is able to hold, and hike pants with the left hand, continues to have difficulty with securely holding flowers.   03/03/2023: left grip strength: 13# 01/22/2023: Left: 12# Eval: Pt. Is unable to securely hold flowers. Goal status: Ongoing   5.  Pt. will independently recall adaptive  strategies for performing ADL/ADL kitchen tasks.  Baseline: 08/20/2023: Revised to include IADL kitchen tasks.  07/23/2023: Continue 05/28/2023: Pt. Needs continued education about adaptive strategies.05/26/2023: Continue 04/09/2023: Continue3/26/2024: Continue 01/22/2023: Pt. continues to benefit from education about adaptive strategies during ADLs, and IADLs. Eval: Pt. to be provided with adaptive strategies. Goal status: Revised   6.  Pt. will improve FOTO score by 2 points to reflect patient perceived performance improvement assessment specific ADLs  and IADLs Baseline: 16/09/9603: FOTO score: 61 07/23/2023: FOTO score: 57 05/28/2023: FOTO score 59 05/26/2023: TBD 5/02/204:  TBD 03/03/23: FOTO 62 Eval: 57 Goal status: Ongoing  7.  Pt. will improve left hand coordination skills in order to be able to  manipulate small objects.     Baseline: 09/17/2023: Right: 26 sec., Left:  3 min, & 49 sec. pegs placed in 5 min.; Pt. Was able to remove  9 vertical pegs in 24 sec. 08/20/2023: Right: 27 sec., Left:  5 pegs placed in 5 min.; Pt. Was able to remove 9 vertical pegs in 28 sec. Pt. Is able to hold, and sort utensils in a drawer. Pt. Has difficulty manipulating small objects. 07/23/2023: Right: 33 sec., Left:  Pt. Is unable to grasp, and place pegs into the pegboard. Pt. Was able to remove 9 vertical pegs in 34 sec. 05/28/2023: Left:  5 min. 05/26/2023 Pt. Continues to present with difficulty handling and sorting utensils. 04/14/2023: 56 04/09/2023: TBD 03/03/23: 1 min. & 39 01/22/2022: Left: 3 min. & 4 sec. Eval: Pt. has difficulty sorting, and placing utensils with the left hand. Left FMC : >5 min. For 7 pegs on the 9 hole peg test.    Goal Status: Improving, Revised to be able to improve left hand FMC to be able to manipulate  small objects   8. Pt. will improve active left 2nd digit extension to be able able to isolate her 2nd digit in preparation for pressing/pushing buttons on appliances, phones, or remotes. Baseline: 09/17/2023: Pt. Presents with limited isolated 2nd digit extension with increased time. 08/20/2023: Pt. Is more consistently able to isolate 2nd digit extension with increased time. 07/23/2023: Pt. Continues to work on consistency with isolating left 2nd digit extension 05/28/2023: Pt. Continues to improve with left 2nd digit extension 05/26/2023: Improving with left 2nd digit extension. 04/09/2023: Pt. Is progressing with isolating left 2nd digit extension, however continues to present with limited increased flexor tone. 03/03/23: Pt. Continues to work on improving consistency with 2nd digit extension to press the remote. 01/22/2023: is able to perform full digit extension, although 2nd digit is slow to extend s 2/2 flexor tone. Pt.  Eval: Pt. is able to is unable to actively perform full digit extension Goal status: Ongoing  9: Pt. Will cut food with modified independence    Baseline: 09/17/2023: Pt. Is unable to cut the food on her plate,  as well as cut food for meal preparation. 08/20/2023: Pt. Is unable to cut the food on her plate, as well as cut food for meal preparation.    Goal Status:  Ongoing   ASSESSMENT:   CLINICAL IMPRESSION:  Pt. tolerated Left UE ROM, and UE exercise on the UBE well without reports of increased pain. Pt. presented required verbal, visual, and tactile cues for proper form, and technique for hand position when grasping the single width clips. Pt. continues to work on improving LE UE ROM, strength, FMC, and hand function skills in order to work towards improving, and maximizing independence with ADLs, and IADL tasks.    PERFORMANCE DEFICITS in functional skills including ADLs, IADLs, coordination, proprioception, ROM, strength, FMC, and GMC, cognitive skills including memory, and psychosocial skills including coping strategies, environmental adaptation, interpersonal interactions, and routines and behaviors.    IMPAIRMENTS are limiting patient from ADLs, IADLs, education, leisure, and social participation.    COMORBIDITIES may have co-morbidities  that affects occupational performance. Patient will benefit from skilled OT to address above impairments and improve overall function.   MODIFICATION OR ASSISTANCE TO COMPLETE EVALUATION: Min-Moderate modification of tasks or assist with assess necessary to complete an evaluation.   OT OCCUPATIONAL PROFILE AND HISTORY: Detailed assessment: Review of records and additional review of physical, cognitive, psychosocial history related to current functional performance.   CLINICAL DECISION MAKING: Moderate - several treatment options, min-mod task modification necessary   REHAB POTENTIAL: Good   EVALUATION COMPLEXITY: Moderate      PLAN: OT FREQUENCY: 2x/week   OT DURATION: 12 weeks   PLANNED INTERVENTIONS: self care/ADL training, therapeutic exercise, therapeutic activity, neuromuscular re-education, manual therapy, passive range of motion, functional  mobility training, electrical stimulation, and paraffin   RECOMMENDED OTHER SERVICES: PT   CONSULTED AND AGREED WITH PLAN OF CARE: Patient and family member/caregiver   PLAN FOR NEXT SESSION: see above    Olegario Messier, MS, OTR/L  09/22/2023

## 2023-09-24 ENCOUNTER — Ambulatory Visit: Payer: Medicare HMO | Admitting: Occupational Therapy

## 2023-09-24 ENCOUNTER — Ambulatory Visit: Payer: Medicare HMO | Admitting: Physical Therapy

## 2023-09-24 DIAGNOSIS — R278 Other lack of coordination: Secondary | ICD-10-CM

## 2023-09-24 DIAGNOSIS — M6281 Muscle weakness (generalized): Secondary | ICD-10-CM

## 2023-09-24 DIAGNOSIS — R262 Difficulty in walking, not elsewhere classified: Secondary | ICD-10-CM

## 2023-09-24 DIAGNOSIS — I6381 Other cerebral infarction due to occlusion or stenosis of small artery: Secondary | ICD-10-CM | POA: Diagnosis not present

## 2023-09-24 DIAGNOSIS — R531 Weakness: Secondary | ICD-10-CM | POA: Diagnosis not present

## 2023-09-24 DIAGNOSIS — R2681 Unsteadiness on feet: Secondary | ICD-10-CM | POA: Diagnosis not present

## 2023-09-24 NOTE — Therapy (Signed)
Occupational Therapy Neuro Treatment Note    Patient Name: MEKISHA KASPAR MRN: [id number] DOB:1940/12/30, 82 y.o., female Today's Date: 09/24/2023  PCP: Dr. Judithann Sheen REFERRING PROVIDER:  Dr. Judithann Sheen  END OF SESSION:  PLAN:   OT End of Session - 09/24/23 1800     Visit Number 62    Number of Visits 96    Date for OT Re-Evaluation 11/12/23    OT Start Time 1535    OT Stop Time 1615    OT Time Calculation (min) 40 min    Activity Tolerance Patient tolerated treatment well    Behavior During Therapy WFL for tasks assessed/performed                   Past Medical History:  Diagnosis Date   Anemia    Arthritis    Asthma    uses inhaler just prior to surgery to avoid attack   Back pain    from previous injury   Complication of anesthesia    has woken  up during 2 different surgery   Depression    no current issue/treatment; situation   Gallstones    GERD (gastroesophageal reflux disease)    Hiatal hernia    patient does NOT have nerve/muscle disease   History of kidney stones    HLD (hyperlipidemia)    HTN (hypertension)    Hypothyroidism    Kidney stones    Knee pain    Nausea and vomiting 10/15/2022   Non-diabetic pancreatic hormone dysfunction years   pt. 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   Pancreatitis    Pneumonia    Seizures (HCC)    caused by dye injected during a procedure   Shortness of breath    with exertion   Sinus problem    frequent infections/congestion   Stroke (HCC) 2021   reports having CVA in 2021 and having mini strokes before that   Thyroid disease    Past Surgical History:  Procedure Laterality Date   ABDOMINAL HYSTERECTOMY     APPENDECTOMY     CARPAL TUNNEL RELEASE  10+ years ago   bilateral   EYE SURGERY  3 yrs ago   bilateral cataracts   FOOT OSTEOTOMY  6 weeks ago   Left foot: great, 2nd & 3rd   FOOT OSTEOTOMY  5 years ago   Right great toe   HAND SURGERY Bilateral 2011-most recent   multiple hand surgeries, 2  on left, 3 on right   KNEE ARTHROPLASTY Right 04/28/2022   Procedure: COMPUTER ASSISTED TOTAL KNEE ARTHROPLASTY;  Surgeon: Donato Heinz, MD;  Location: [facility];  Service: Orthopedics;  Laterality: Right;   LOOP RECORDER INSERTION N/A 05/16/2020   Procedure: LOOP RECORDER INSERTION;  Surgeon: Marcina Millard, MD;  Location: [facility];  Service: Cardiovascular;  Laterality: N/A;   NASAL SINUS SURGERY  most recent 7-8 yrs ago   7 sinus surgeries    TRIGGER FINGER RELEASE  11/19/2011   Procedure: RELEASE TRIGGER FINGER/A-1 PULLEY;  Surgeon: Nicki Reaper, MD;  Location: [facility];  Service: Orthopedics;  Laterality: Right;  release a-1 pulley right index finger and cyst removal   WRIST GANGLION EXCISION  1980's   right   Patient Active Problem List   Diagnosis Date Noted   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 11/27/2022   Hypomagnesemia 11/27/2022   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   HLD (hyperlipidemia) 11/23/2022   Asthma 11/23/2022   Depression with  anxiety 11/23/2022   Chronic diastolic CHF (congestive heart failure) (HCC) 11/23/2022   Normocytic anemia 11/23/2022   Aspiration pneumonia (HCC) 11/23/2022   AKI (acute kidney injury) (HCC) 11/23/2022   Abdominal pain 11/23/2022   Mesenteric mass 11/23/2022   Coffee ground emesis 11/23/2022   Nausea and vomiting 10/15/2022   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   Fatigue 09/17/2022   Left hemiparesis (HCC) 09/17/2022   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HCC) 08/22/2022   GERD (gastroesophageal reflux disease) 08/21/2022   Agitation 08/20/2022   Acute left-sided weakness 08/20/2022   Expressive aphasia    Stroke (HCC) 08/19/2022   Leukocytosis 08/19/2022   History of urticaria 04/28/2022   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   Primary osteoarthritis of left knee 02/24/2022   Primary osteoarthritis of right knee 02/24/2022   Lumbar spondylolysis 04/12/2020   History of CVA  (cerebrovascular accident) 03/26/2020   Low back pain radiating to right lower extremity 03/21/2020   B12 deficiency 03/06/2020   Positive anti-CCP test 12/21/2019   Arthralgia 12/13/2019   Dermatitis 12/13/2019   Rheumatoid factor positive 12/13/2019   Essential hypertension 12/11/2018   Palpitations 12/11/2018   Acquired hypothyroidism 11/10/2018   Arthritis of knee 09/17/2016   Anxiety 11/22/2014   Asthma without status asthmaticus 11/22/2014   Benign neoplasm of colon, unspecified 11/22/2014   Environmental allergies 11/22/2014   Hypertriglyceridemia 11/22/2014   Hypokalemia 11/22/2014   Personal history of disease of skin and subcutaneous tissue 11/22/2014   REFERRING DIAG: CVA   THERAPY DIAG:  Muscle weakness (generalized)   Other lack of coordination   Rationale for Evaluation and Treatment Rehabilitation   SUBJECTIVE:   SUBJECTIVE STATEMENT:  Pt. reports feeling better today  Pt accompanied by: significant other   PERTINENT HISTORY: Patient is an 82 year-old female who was [facility] to [facility] on 11/23/2022 with a cerebellar CVA. 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 Pt. Was admitted to Inpatient Rehab from 12/21-1/02/2023. 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  Patient underwent inpatient rehabilitation for 2 weeks.  Patient was assessed and was scheduled for a knee replacement on 08/29/2022 however had to cancel it due to having had a CVA.  Patient had a recent fall 2 days after discharging from inpatient rehab. Past Medical History includes: Knee replacement, essential HTN, hypokalemia, leukocytosis, seizures, positive anti-- CCP test, anxiety disorder, mini strokes.  Patient had shingles with left eye nerve pain s/p 1 year ago.    PRECAUTIONS: Fall   WEIGHT BEARING RESTRICTIONS No   PAIN:  Are you having pain?  No   FALLS: Has patient fallen in last 6 months? Yes.  Number of falls 1   LIVING ENVIRONMENT: Lives with: Lives with Spouse Lives in: House/apartment Stairs: 2 storey home, resides on the first floor.  External: 2 stairs front no rails, and 6 in back with rails Has following equipment at home: Single point cane, Walker - 2 wheeled, Environmental consultant - 4 wheeled, Shower bench, and bed side commode   PLOF: Independent   PATIENT GOALS  To Regain the use of her left arm   OBJECTIVE:    HAND DOMINANCE: Right   ADLs: Overall ADLs: Husband assists pt. as needed Transfers/ambulation related to ADLs:Pt. Uses a 3 wheeled walker with Husband assist. Eating: Pt. Is independent with the right hand. Pt. has difficulty cutting food. Grooming: Pt. is using her right hand, however has difficulty sustaining her LUE in elevation to assist with haircare. UB Dressing: Pt.  Is independent donning a pullover shirt, and button down shirt. Has difficulty with buttoning, LB Dressing:  Independent donning pants, and socks. Difficulty tying shoes. Toileting: Independent Bathing: Pt. Is able to engage her right hand. Tub Shower transfers: Supervision Equipment: See above for equipment    IADLs: Shopping:  Has not had the opportunity for grocery shopping yet Light housekeeping: Husband is assisting with light house keeping Meal Prep:  Dependent Community mobility: Relies of family/friends Medication management: Husband assisting with weekly pillbox set-up, and administering medication. Financial management: TBD Handwriting: 75% legible   MOBILITY STATUS: Hx of falls   POSTURE COMMENTS:  No Significant postural limitations Sitting balance: supported sitting balance WFL   ACTIVITY TOLERANCE: Activity tolerance:  Fatigues in greater than 30 min.    FUNCTIONAL OUTCOME MEASURES: FOTO: 57   UPPER EXTREMITY ROM      Active ROM Right Eval: WFL Left eval Left  01/22/23 Left  03/03/23 Left  04/14/23 Left  05/28/2023 Left 07/23/2023 Left 08/20/2023 Left 09/17/23   Shoulder flexion   132 100 108 108 108 60(70) 70(78) 70(102)  Shoulder abduction   80 85 85 85 85 65(78) 72(85) 73(94)  Shoulder adduction             Shoulder extension             Shoulder internal rotation             Shoulder external rotation             Elbow flexion   140 140 WFL [facility] WFL  Elbow extension   WNL WNL [facility] WFL WFL  Wrist flexion   65 68        Wrist extension   -10 20 24  32 32 -10 20 20   Wrist ulnar deviation     12 10 14 14 10 18 18   Wrist radial deviation     8 14 14 16 12 14 14   Wrist pronation             Wrist supination             (Blank rows = not tested)   Left digit flexion to [facility]: 2nd: 0cm, 3rd: 0cm, 4th: 0cm, 5th: 0cm   Limited Left full 2nd digit extension     UPPER EXTREMITY MMT:      MMT Right Eval: 4+/5 overall Right 07/23/2023 Left Eval Left 01/22/23 Left 03/03/23 Left  04/14/2023 Left 05/28/2023 Left 07/23/2023 Left 08/20/2023 Left 09/17/23  Shoulder flexion   3+/5 3/5 3-/5 3-/5 3-/5 3-/5 2/5 2+/5 2+/5  Shoulder abduction   3+/5 3-/5 3-/5 3-/5 3-/5 3-/5 2/5 2+/5 2+/5  Shoulder adduction              Shoulder extension              Shoulder internal rotation              Shoulder external rotation              Middle trapezius              Lower trapezius              Elbow flexion   4/5 3+/5 4/5 N/A 4/5 4/5 3+/5 3+/5 3+/5  Elbow extension   4/5 3+/5 4/4 N/A 4/5 4/5 3+/5 3+/5 4-/5  Wrist flexion              Wrist extension  4/5 2-/5 3-/5 3-/5 3/5 3+/5 3-/5 3+/5 3+/5  Wrist ulnar deviation              Wrist radial deviation              Wrist pronation              Wrist supination              (Blank rows = not tested)   HAND FUNCTION: Grip strength: Right: 26#, Left: 10# Pinch strength: Right 8#, Left: 3#, 3 Pt. Pinch strength: Right: 9#, L: 2#  01/22/2023 Grip strength: Right: 26#, Left: 12# Pinch strength:  Pinch meter used at the initial eval has been sent out for recalibration   03/03/2023 Grip  strength: Right: 26#, Left: 13# Pinch strength:  Pinch meter used at the initial eval has been sent out for recalibration  04/14/2023: Grip strength: Right: 26#, Left: 13# Pinch strength:   Right 8#, Left: 4#, 3 Pt. Pinch strength: Right: 9#, L: 3#    05/28/2023: Grip strength: Right: 26#, Left: 13# Pinch strength:   Right 8#, Left: 2#, 3 Pt. Pinch strength: Right: 9#, L: 4#  07/23/2023  Grip strength: Right: 5#, Left: 2# Pinch strength:   Right 6#, Left: 2#, 3 Pt. Pinch strength: Right: 5#, L: 2#  08/20/2023:    Grip strength: Right: 8#, Left: 4# Pinch strength:   Right 6#, Left: 2#, 3 Pt. Pinch strength: Right: 5#, L: 2#    09/17/23:  Grip strength: Right: 5#, Left: 4# Pinch strength:   Right 6#, Left: 2#, 3 Pt. Pinch strength: Right: 5#, L: 2#   COORDINATION: Right: 22 sec., Left: <5 min. To place 7 pegs with increased compensation proximally in the trunk, and through reflexive associated reactions.  01/22/23 Right: 22 sec., Left: 3 min. & 4 sec.  03/03/23 Right: 22 sec., Left: 1 min. & 39 sec.  04/14/23   TBD  05/28/23 Right: 22 sec., Left:  5 min.  07/23/2023  Right: 33 sec., Left:  Pt. Is unable to grasp, and place pegs into the pegboard. Pt. Was able to remove 9 vertical pegs in 34 sec.  08/20/2023:  Right: 27 sec., Left:  5 pegs placed in 5 min.; Pt. Was able to remove 9 vertical pegs in 28 sec.  09/17/23:  Right: 26 sec., Left:  3 min, & 49 sec. pegs placed in 5 min.; Pt. Was able to remove 9 vertical pegs in 24 sec.         SENSATION: Light touch: WFL, proprioceptive awareness: Intact   EDEMA: N/A   MUSCLE TONE: LUE: Hypotonic   COGNITION: Overall cognitive status: WFL for tasks assessed. Pt. Is impulsive at times.   VISION: Subjective report: Pt. report having shingles affecting left eye  s/p 1 year. Has nerve pain Baseline vision: Wears glasses for reading only Visual history:  updated see clinical impression   VISION ASSESSMENT:    WFL for  tasks performed   PERCEPTION: Intact   PRAXIS: Impaired: Motor planning   OBSERVATIONS:  Pt. more alert, and engaging since prior to the most recent hospitalization.   TODAY'S TREATMENT:    There. Ex.:   Pt. tolerated PROM/AAROM to the Left shoulder, elbow, wrist, and digits. Pt. worked on [employer], and reciprocal motion using the UBE while seated for 8 min. with no resistance. Constant monitoring was provided.  Pt. worked on pinch strengthening in the left hand for lateral, and 3pt. pinch using yellow, and red  resistive clips. Pt. worked on placing the clips at various vertical and horizontal angles. Tactile and verbal cues were required for eliciting the desired movement.        PATIENT EDUCATION: Education details: LUE therapeutic exercise Person educated: Patient and Spouse Education method: Medical illustrator Education comprehension: verbalized understanding, returned demonstration, and needs further education   HOME EXERCISE PROGRAM:   Reviewed activities at home to promote isolated 2nd digit extension.    GOALS: Goals reviewed with patient? Yes   SHORT TERM GOALS: Target date: 10/01/2023       1. Patient will be independent with home exercise program for the left upper extremity Baseline: 09/17/23: Independent 08/20/23: Pt. continues to require assist from he husband for HEPs 07/23/2023: Pt. continues to require assist. 05/28/2023: Pt. Requires assist 04/09/2023: Pt. Continues to consistently attempt to engage her hand at hand 02/01/2023: Pt. Consistently attempts to perform HEPs independently. No current home exercise program Goal status: Ongoing   LONG TERM GOALS: Target date:  08/20/2023   1.  Patient will improve left shoulder strength by 2 mm grades to be able to sustain UE's in elevation long enough to wash her hair.  Baseline: 09/17/2023: Left shoulder 2+/5, abduction 2+/5 08/20/2023: Left shoulder flexion 2+/5, abduction 2+/5  07/23/2023: Left shoulder  flexion: 2/5, abduction: 2/5. 05/28/2023: Left shoulder flexion: 3-/5, abduction: 3-/5. Pt. has an old left shoulder injury limiting progression with left shoulder strength 05/26/2023: Pt. continues to present with limitiations in sustaining LUE elevation long enough to perform hair care thoroughly 04/09/2023: Pt. is limited with sustaining LUE elevation long enough to perform hair care thoroughly. 03/03/2023: Left shoulder flexion: 3-/5, abduction: 3-/5 01/22/2023: Left shoulder flexion: 3-/5, abduction: 3-/5 Eval: Left shoulder flexion: 3/5, abduction: 3-/5 Goal status: Ongoing   2.  Patient will improve left shoulder active abduction to be able to comb her hair Baseline: 09/17/2023: 73(94) Pt. is able to reach her left side, and back of hair. 9/11/09/2023: 72(85) Pt. is able to reach her left side, and back of hair. 07/23/2023: Left shoulder flexion: 60(70) abduction: 65(78) 05/28/2023: left shoulder flexion: 108 abduction: 85 Pt. Has an old left shoulder injury limiting progression with left shoulder ROM 05/26/2023:  5/10 left shoulder pain with ROM limits using it functionally during hair care. 04/09/2023: Pt. Presents with difficulty abducting her left shoulder enough to thoroughly complete haircare. 03/03/2023: Shoulder abduction: 85 01/22/2023: Shoulder abduction: 85 Eval: Left shoulder abduction is 80(108) Goal status: Improving, Ongoing   3.  Patient will independently button her shirt with modified independence. Baseline:09/17/2023: Pt. Has a buttonhook, however does not know where it is. Pt. requires increased time to complete, and assist from her husband. 08/20/2023: Pt. requires increased time to complete, and assist from her husband. 07/23/2023 Pt. presents with difficulty buttoning her shirt. 05/28/2023: pt. Continues to work towards progressing with buttoning 05/26/2023:  Pt. Continues to progress towards buttoning. 04/09/2023: Pt. Continues to progress towards buttoning. 03/03/2023: Pt. Continues to have  difficulty with buttoning. 01/22/2023: Pt. continues to have difficulty. Eval: Patient has difficulty.  Goal status: Ongoing   4.  Patient well improve left grip strength in preparation for securely hold a glass/beverage Baseline: 09/17/2023: Grip strength: Right: 5#, Left: 4# Pt. Has difficulty holding a glass. Right: 8#, Left: 4# Pt. Is able to more securely hold mirrors, and flowers with the left hand, however has difficulty holding a glass beverage, or bottle. 08/20/2023: Right: 8#, Left: 4# Pt. Is able to more securely hold mirrors, and flowers  with the left hand, however has difficulty holding a glass beverage, or bottle. 07/23/2023: Grip strength: Right: 5#, Left: 2# 05/28/2023: Right: 26#, Left: 13#  pt. Presents with difficulty securely holding flowers in her left hand. 05/26/2023: TBD 04/09/2023: Pt. Is able to hold, and hike pants with the left hand, continues to have difficulty with securely holding flowers.   03/03/2023: left grip strength: 13# 01/22/2023: Left: 12# Eval: Pt. Is unable to securely hold flowers. Goal status: Ongoing   5.  Pt. will independently recall adaptive  strategies for performing ADL/ADL kitchen tasks.  Baseline: 08/20/2023: Revised to include IADL kitchen tasks.  07/23/2023: Continue 05/28/2023: Pt. Needs continued education about adaptive strategies.05/26/2023: Continue 04/09/2023: Continue3/26/2024: Continue 01/22/2023: Pt. continues to benefit from education about adaptive strategies during ADLs, and IADLs. Eval: Pt. to be provided with adaptive strategies. Goal status: Revised   6.  Pt. will improve FOTO score by 2 points to reflect patient perceived performance improvement assessment specific ADLs  and IADLs Baseline: 13/07/6577: FOTO score: 61 07/23/2023: FOTO score: 57 05/28/2023: FOTO score 59 05/26/2023: TBD 5/02/204:  TBD 03/03/23: FOTO 62 Eval: 57 Goal status: Ongoing  7.  Pt. will improve left hand coordination skills in order to be able to  manipulate small objects.      Baseline: 09/17/2023: Right: 26 sec., Left:  3 min, & 49 sec. pegs placed in 5 min.; Pt. Was able to remove 9 vertical pegs in 24 sec. 08/20/2023: Right: 27 sec., Left:  5 pegs placed in 5 min.; Pt. Was able to remove 9 vertical pegs in 28 sec. Pt. Is able to hold, and sort utensils in a drawer. Pt. Has difficulty manipulating small objects. 07/23/2023: Right: 33 sec., Left:  Pt. Is unable to grasp, and place pegs into the pegboard. Pt. Was able to remove 9 vertical pegs in 34 sec. 05/28/2023: Left:  5 min. 05/26/2023 Pt. Continues to present with difficulty handling and sorting utensils. 04/14/2023: 56 04/09/2023: TBD 03/03/23: 1 min. & 39 01/22/2022: Left: 3 min. & 4 sec. Eval: Pt. has difficulty sorting, and placing utensils with the left hand. Left FMC : >5 min. For 7 pegs on the 9 hole peg test.    Goal Status: Improving, Revised to be able to improve left hand FMC to be able to manipulate  small objects   8. Pt. will improve active left 2nd digit extension to be able able to isolate her 2nd digit in preparation for pressing/pushing buttons on appliances, phones, or remotes. Baseline: 09/17/2023: Pt. Presents with limited isolated 2nd digit extension with increased time. 08/20/2023: Pt. Is more consistently able to isolate 2nd digit extension with increased time. 07/23/2023: Pt. Continues to work on consistency with isolating left 2nd digit extension 05/28/2023: Pt. Continues to improve with left 2nd digit extension 05/26/2023: Improving with left 2nd digit extension. 04/09/2023: Pt. Is progressing with isolating left 2nd digit extension, however continues to present with limited increased flexor tone. 03/03/23: Pt. Continues to work on improving consistency with 2nd digit extension to press the remote. 01/22/2023: is able to perform full digit extension, although 2nd digit is slow to extend s 2/2 flexor tone. Pt.  Eval: Pt. is able to is unable to actively perform full digit extension Goal status: Ongoing  9: Pt.  Will cut food with modified independence    Baseline: 09/17/2023: Pt. Is unable to cut the food on her plate, as well as cut food for meal preparation. 08/20/2023: Pt. Is unable to cut the food on her  plate, as well as cut food for meal preparation.    Goal Status: Ongoing   ASSESSMENT:   CLINICAL IMPRESSION:  Pt. tolerated Left UE ROM, and UE exercise on the UBE well without reports of increased pain. Pt. required verbal, visual, and tactile cues for proper form when completing 3pt. pinch, and lateral pinch on the resistive clips. Pt. became fatigued prior to the end of the session today, and had difficulty with completing the maze task due to fatigue. Pt. continues to work on improving LE UE ROM, strength, FMC, and hand function skills in order to work towards improving, and maximizing independence with ADLs, and IADL tasks.    PERFORMANCE DEFICITS in functional skills including ADLs, IADLs, coordination, proprioception, ROM, strength, FMC, and GMC, cognitive skills including memory, and psychosocial skills including coping strategies, environmental adaptation, interpersonal interactions, and routines and behaviors.    IMPAIRMENTS are limiting patient from ADLs, IADLs, education, leisure, and social participation.    COMORBIDITIES may have co-morbidities  that affects occupational performance. Patient will benefit from skilled OT to address above impairments and improve overall function.   MODIFICATION OR ASSISTANCE TO COMPLETE EVALUATION: Min-Moderate modification of tasks or assist with assess necessary to complete an evaluation.   OT OCCUPATIONAL PROFILE AND HISTORY: Detailed assessment: Review of records and additional review of physical, cognitive, psychosocial history related to current functional performance.   CLINICAL DECISION MAKING: Moderate - several treatment options, min-mod task modification necessary   REHAB POTENTIAL: Good   EVALUATION COMPLEXITY: Moderate       PLAN: OT FREQUENCY: 2x/week   OT DURATION: 12 weeks   PLANNED INTERVENTIONS: self care/ADL training, therapeutic exercise, therapeutic activity, neuromuscular re-education, manual therapy, passive range of motion, functional mobility training, electrical stimulation, and paraffin   RECOMMENDED OTHER SERVICES: PT   CONSULTED AND AGREED WITH PLAN OF CARE: Patient and family member/caregiver   PLAN FOR NEXT SESSION: see above    Olegario Messier, MS, OTR/L  09/24/2023

## 2023-09-24 NOTE — Therapy (Signed)
OUTPATIENT PHYSICAL THERAPY TREATMENT   Patient Name: [name] MRN: [id number] DOB:04/02/1941, 82 y.o., female Today's Date: 09/24/2023   PCP: Aram Beecham, D MD REFERRING PROVIDER: Jacquelynn Cree PA   END OF SESSION:  PT End of Session - 09/24/23 1635     Visit Number 6    Number of Visits 16    Date for PT Re-Evaluation 10/20/23    Authorization Type [employer]:    Authorization Time Period 24 visits approved 04/14/23-09/25/23    Authorization - Number of Visits 24    Progress Note Due on Visit 20    PT Start Time 1615    PT Stop Time 1700    PT Time Calculation (min) 45 min    Equipment Utilized During Treatment Gait belt    Activity Tolerance Patient tolerated treatment well    Behavior During Therapy WFL for tasks assessed/performed                 Past Medical History:  Diagnosis Date   Anemia    Arthritis    Asthma    uses inhaler just prior to surgery to avoid attack   Back pain    from previous injury   Complication of anesthesia    has woken  up during 2 different surgery   Depression    no current issue/treatment; situation   Gallstones    GERD (gastroesophageal reflux disease)    Hiatal hernia    patient does NOT have nerve/muscle disease   History of kidney stones    HLD (hyperlipidemia)    HTN (hypertension)    Hypothyroidism    Kidney stones    Knee pain    Nausea and vomiting 10/15/2022   Non-diabetic pancreatic hormone dysfunction years   pt. 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   Pancreatitis    Pneumonia    Seizures (HCC)    caused by dye injected during a procedure   Shortness of breath    with exertion   Sinus problem    frequent infections/congestion   Stroke (HCC) 2021   reports having CVA in 2021 and having mini strokes before that   Thyroid disease    Past Surgical History:  Procedure Laterality Date   ABDOMINAL HYSTERECTOMY     APPENDECTOMY     CARPAL TUNNEL RELEASE  10+ years ago   bilateral    EYE SURGERY  3 yrs ago   bilateral cataracts   FOOT OSTEOTOMY  6 weeks ago   Left foot: great, 2nd & 3rd   FOOT OSTEOTOMY  5 years ago   Right great toe   HAND SURGERY Bilateral 2011-most recent   multiple hand surgeries, 2 on left, 3 on right   KNEE ARTHROPLASTY Right 04/28/2022   Procedure: COMPUTER ASSISTED TOTAL KNEE ARTHROPLASTY;  Surgeon: Donato Heinz, MD;  Location: [facility];  Service: Orthopedics;  Laterality: Right;   LOOP RECORDER INSERTION N/A 05/16/2020   Procedure: LOOP RECORDER INSERTION;  Surgeon: Marcina Millard, MD;  Location: [facility];  Service: Cardiovascular;  Laterality: N/A;   NASAL SINUS SURGERY  most recent 7-8 yrs ago   7 sinus surgeries    TRIGGER FINGER RELEASE  11/19/2011   Procedure: RELEASE TRIGGER FINGER/A-1 PULLEY;  Surgeon: Nicki Reaper, MD;  Location: [facility];  Service: Orthopedics;  Laterality: Right;  release a-1 pulley right index finger and cyst removal   WRIST GANGLION EXCISION  1980's   right  ONSET DATE: 11/23/22  REFERRING DIAG: TIA  THERAPY DIAG:  Muscle weakness (generalized)  Other lack of coordination  Difficulty in walking, not elsewhere classified  Unsteadiness on feet  Acute left-sided weakness  Rationale for Evaluation and Treatment: Rehabilitation  SUBJECTIVE:                                                                                                                                                                                             SUBJECTIVE STATEMENT:   Pt reports that she is doing well. Only mild pain in the L knee. Husband reports that she will start gel shots in the L knee next Monday. Also states this she was very tired following OT treatment this afternoon.   Pt accompanied by: significant other  PERTINENT HISTORY: [name] is an 15 year-old female who was admitted to [facility] on 11/23/2022 with a cerebellar CVA. 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 Pt. Was admitted to Inpatient Rehab from 12/21-1/02/2023. 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  Patient underwent inpatient rehabilitation for 2 weeks.  Patient was assessed and was scheduled for a knee replacement on 08/29/2022 however had to cancel it due to having had a CVA. PMH includes: anemia, arthritis, back pain, depression, hernia, GERD, HLD, HTN, hypothyroidism, knee pain, pancreatic hormone dysfunction, pneumonia, seizures, stroke, thyroid disease.  Patient has a donjoy brace for L knee.   PAIN:  Are you having pain? Yes, only with end range flexion and STS.    PRECAUTIONS: Fall, has latex allergy  WEIGHT BEARING RESTRICTIONS: No  FALLS: Has patient fallen in last 6 months? Yes. Number of falls 4 falls   LIVING ENVIRONMENT: Lives with: lives with their family and lives with their spouse Lives in: House/apartment Stairs: Yes: Internal: flight steps;   and External: 2 steps; on right going up and on left going up Has following equipment at home: Single point cane, Walker - 2 wheeled, Shower bench, and bed side commode  PLOF: Independent  PATIENT GOALS: to have less pain and move better  OBJECTIVE:   TODAY'S TREATMENT:  DATE: 09/24/23  Pt performed stand pivot transfer to mat table with CGA.   Sit<>supine with supervision assist for safety.  Win min cues for safety to use RUE to push into sitting   Supine therex:  SAQ x 15  SLR, x 8 with AAROM Hip abduction x 12  AROM Clam shell x 15 YTB Hip flexion  x 15 YTB  Bridge with ball between thighs. X 8  Ankle PF  x 15 YTB.   Sit<>stand with CGA. Standing with 1 UE support x 1 min with no s/s of orthostatic hypotension  Gait with RW x3ft with CGA for safety and no pain reported in the L knee with gait on this day.    Throughout  PT treatment, pt required multiple therapeutic rest breaks, but recovered quickly.    PATIENT EDUCATION: Education details: Pt educated throughout session about proper posture and technique with exercises. Improved exercise technique, movement at target joints, use of target muscles after min to mod verbal, visual, tactile cues.   HOME EXERCISE PROGRAM:  Pending visit 2   GOALS: Goals reviewed with patient? Yes  SHORT TERM GOALS: Target date: 09/19/23  Patient will be independent in starter HEP to improve A/ROM tolerance and preserve available ROM of LLE and to advance A/ROM and strength of RLE.  Baseline: 08/20/23: defer to vists 2-3  Goal status: NEW  LONG TERM GOALS: Target date: 10/20/23  Patient will increase FOTO score to equal to or greater than 65%  to demonstrate statistically significant improvement in ease of mobility.  Baseline:  47%; 06/23/23: 60;   7/16: 60%  Goal status: NEW   2.  Patient (> 13 years old) will complete five times sit to stand test in < 15 seconds indicating an increased RLE power.  Baseline: 5/7: unable to tolerate ; 7/16: 17.4sec Goal status: NEW  3.   Patient to report tolerance of ad lib AMB in home during the day of distances >16ft >5x at supervision level or better to improve independence with ADL.    Baseline: 08/20/23: using transport chair for nearly all mobility, not tolerating walking due to WB limitations of LLE  Goal status: NEW    ASSESSMENT:  CLINICAL IMPRESSION:   Pt shows significant progress this session,continues to demonstrate improved tolerance to full ROM and standing. Due to fatigue at start of PT treatment, session focused on bed level therex, but able to tolerate prolonged standing without s/s of orthostatic hypotension or pain in the L knee.   The patient will benefit from skilled physical therapy to reduce pain, improve mobility, improve strength and improve quality of life.     OBJECTIVE IMPAIRMENTS:  Abnormal gait, cardiopulmonary status limiting activity, decreased activity tolerance, decreased balance, decreased cognition, decreased endurance, decreased knowledge of use of DME, decreased mobility, difficulty walking, decreased ROM, decreased strength, decreased safety awareness, hypomobility, increased fascial restrictions, impaired perceived functional ability, impaired flexibility, impaired UE functional use, improper body mechanics, postural dysfunction, and pain.   ACTIVITY LIMITATIONS: carrying, lifting, bending, sitting, standing, squatting, stairs, transfers, bed mobility, bathing, toileting, dressing, reach over head, hygiene/grooming, locomotion level, and caring for others  PARTICIPATION LIMITATIONS: meal prep, cleaning, laundry, medication management, personal finances, interpersonal relationship, driving, shopping, community activity, yard work, school, and church  PERSONAL FACTORS: Age, Behavior pattern, Education, Fitness, Past/current experiences, Time since onset of injury/illness/exacerbation, and 3+ comorbidities: anemia, arthritis, back pain, depression, hernia, GERD, HLD, HTN, hypothyroidism, knee pain, pancreatic hormone dysfunction, pneumonia, seizures, stroke, thyroid disease  are also  affecting patient's functional outcome.   REHAB POTENTIAL: Fair    CLINICAL DECISION MAKING: Evolving/moderate complexity  EVALUATION COMPLEXITY: Moderate  PLAN:  PT FREQUENCY: 1-2x/week  PT DURATION: 12 weeks  PLANNED INTERVENTIONS: Therapeutic exercises, Therapeutic activity, Neuromuscular re-education, Balance training, Gait training, Patient/Family education, Self Care, Joint mobilization, Joint manipulation, Stair training, Vestibular training, Canalith repositioning, Visual/preceptual remediation/compensation, Orthotic/Fit training, DME instructions, Electrical stimulation, Wheelchair mobility training, Spinal mobilization, Cryotherapy, Moist heat, Compression bandaging, scar  mobilization, Splintting, Taping, Traction, Ultrasound, Manual therapy, and Re-evaluation  PLAN FOR NEXT SESSION:  Tolerance to standing in functional movement/ tasks.    Golden Pop, PT, DPT 09/24/2023, 4:39 PM   4:39 PM, 09/24/23  Physical Therapist - [facility]  [phone] [facility])

## 2023-09-28 DIAGNOSIS — M1712 Unilateral primary osteoarthritis, left knee: Secondary | ICD-10-CM | POA: Diagnosis not present

## 2023-09-29 ENCOUNTER — Ambulatory Visit: Payer: Medicare HMO | Admitting: Physical Therapy

## 2023-09-29 ENCOUNTER — Ambulatory Visit: Payer: Medicare HMO | Admitting: Occupational Therapy

## 2023-09-29 DIAGNOSIS — R262 Difficulty in walking, not elsewhere classified: Secondary | ICD-10-CM

## 2023-09-29 DIAGNOSIS — R2681 Unsteadiness on feet: Secondary | ICD-10-CM

## 2023-09-29 DIAGNOSIS — R278 Other lack of coordination: Secondary | ICD-10-CM | POA: Diagnosis not present

## 2023-09-29 DIAGNOSIS — R531 Weakness: Secondary | ICD-10-CM

## 2023-09-29 DIAGNOSIS — M6281 Muscle weakness (generalized): Secondary | ICD-10-CM

## 2023-09-29 DIAGNOSIS — I6381 Other cerebral infarction due to occlusion or stenosis of small artery: Secondary | ICD-10-CM | POA: Diagnosis not present

## 2023-09-29 NOTE — Therapy (Signed)
OUTPATIENT PHYSICAL THERAPY TREATMENT   Patient Name: [name] MRN: [id number] DOB:[birth year], 82 y.o., female Today's Date: 09/29/2023   PCP: Aram Beecham, D MD REFERRING PROVIDER: Jacquelynn Cree PA   END OF SESSION:  PT End of Session - 09/29/23 1625     Visit Number 7    Number of Visits 16    Date for PT Re-Evaluation 10/20/23    Authorization Type [employer] Medicare:    Authorization Time Period 24 visits approved 04/14/23-09/25/23    Authorization - Number of Visits 24    Progress Note Due on Visit 20    PT Start Time 1618    PT Stop Time 1700    PT Time Calculation (min) 42 min    Equipment Utilized During Treatment Gait belt    Activity Tolerance Patient tolerated treatment well    Behavior During Therapy WFL for tasks assessed/performed                 Past Medical History:  Diagnosis Date   Anemia    Arthritis    Asthma    uses inhaler just prior to surgery to avoid attack   Back pain    from previous injury   Complication of anesthesia    has woken  up during 2 different surgery   Depression    no current issue/treatment; situation   Gallstones    GERD (gastroesophageal reflux disease)    Hiatal hernia    patient does NOT have nerve/muscle disease   History of kidney stones    HLD (hyperlipidemia)    HTN (hypertension)    Hypothyroidism    Kidney stones    Knee pain    Nausea and vomiting 10/15/2022   Non-diabetic pancreatic hormone dysfunction years   pt. 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   Pancreatitis    Pneumonia    Seizures (HCC)    caused by dye injected during a procedure   Shortness of breath    with exertion   Sinus problem    frequent infections/congestion   Stroke (HCC) 2021   reports having CVA in 2021 and having mini strokes before that   Thyroid disease    Past Surgical History:  Procedure Laterality Date   ABDOMINAL HYSTERECTOMY     APPENDECTOMY     CARPAL TUNNEL RELEASE  10+ years ago   bilateral    EYE SURGERY  3 yrs ago   bilateral cataracts   FOOT OSTEOTOMY  6 weeks ago   Left foot: great, 2nd & 3rd   FOOT OSTEOTOMY  5 years ago   Right great toe   HAND SURGERY Bilateral 2011-most recent   multiple hand surgeries, 2 on left, 3 on right   KNEE ARTHROPLASTY Right 04/28/2022   Procedure: COMPUTER ASSISTED TOTAL KNEE ARTHROPLASTY;  Surgeon: Donato Heinz, MD;  Location: [facility];  Service: Orthopedics;  Laterality: Right;   LOOP RECORDER INSERTION N/A 05/16/2020   Procedure: LOOP RECORDER INSERTION;  Surgeon: Marcina Millard, MD;  Location: [facility];  Service: Cardiovascular;  Laterality: N/A;   NASAL SINUS SURGERY  most recent 7-8 yrs ago   7 sinus surgeries    TRIGGER FINGER RELEASE  11/19/2011   Procedure: RELEASE TRIGGER FINGER/A-1 PULLEY;  Surgeon: Nicki Reaper, MD;  Location: [facility];  Service: Orthopedics;  Laterality: Right;  release a-1 pulley right index finger and cyst removal   WRIST GANGLION EXCISION  1980's   right  ONSET DATE: 11/23/22  REFERRING DIAG: TIA  THERAPY DIAG:  Unsteadiness on feet  Muscle weakness (generalized)  Other lack of coordination  Difficulty in walking, not elsewhere classified  Acute left-sided weakness  Rationale for Evaluation and Treatment: Rehabilitation  SUBJECTIVE:                                                                                                                                                                                             SUBJECTIVE STATEMENT:   Pt reports that she is doing well. Received "gel shot" yesterday in the L knee for pain management. No pain reported today.   Pt accompanied by: significant other  PERTINENT HISTORY: [name] is an 82 year-old female who was admitted to [facility] on 11/23/2022 with a cerebellar CVA. 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 Pt. Was admitted to Inpatient Rehab from 12/21-1/02/2023. 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  Patient underwent inpatient rehabilitation for 2 weeks.  Patient was assessed and was scheduled for a knee replacement on 08/29/2022 however had to cancel it due to having had a CVA. PMH includes: anemia, arthritis, back pain, depression, hernia, GERD, HLD, HTN, hypothyroidism, knee pain, pancreatic hormone dysfunction, pneumonia, seizures, stroke, thyroid disease.  Patient has a donjoy brace for L knee.   PAIN:  Are you having pain? Yes, only with end range flexion and STS.    PRECAUTIONS: Fall, has latex allergy  WEIGHT BEARING RESTRICTIONS: No  FALLS: Has patient fallen in last 6 months? Yes. Number of falls 4 falls   LIVING ENVIRONMENT: Lives with: lives with their family and lives with their spouse Lives in: House/apartment Stairs: Yes: Internal: flight steps;   and External: 2 steps; on right going up and on left going up Has following equipment at home: Single point cane, Walker - 2 wheeled, Shower bench, and bed side commode  PLOF: Independent  PATIENT GOALS: to have less pain and move better  OBJECTIVE:   TODAY'S TREATMENT:  DATE: 09/29/23  Pt performed stand pivot transfer to nustep with CGA and no AD.   Nustep reciprocal movement and endurance training 2 x 2.5 min with 1 min rest break between bouts. Performed at level 1. No pain   Standing tolerance with use of blaze pods  Random: 20 hits  66 sec  with BUE  31 sec with RUE 44 sec with LUE  Focus: 20 hits  2 distractors RUE: 28sec  LUE 38 sec  3 distractors  RUE: 21 sec  LUE: 32 sec   The Blaze Pod Focus setting was selected to refine precision and concentration, isolating specific muscle groups or movements to enhance overall coordination and targeted muscle engagement. The Blaze Pod Random setting  was chosen to enhance cognitive processing and agility, providing an unpredictable environment to simulate real-world scenarios, and fostering quick reactions and adaptability.   Throughout session, pt tolerates standing at RW without report of knee pain on the LLE. Min cues for safety and proper stance width use of UE on RW   Gait with RW and L knee brace x 30ft without knee pain. CGA for safety and min cues for AD management in turns.     PATIENT EDUCATION: Education details: Pt educated throughout session about proper posture and technique with exercises. Improved exercise technique, movement at target joints, use of target muscles after min to mod verbal, visual, tactile cues.   HOME EXERCISE PROGRAM:  Pending visit 2   GOALS: Goals reviewed with patient? Yes  SHORT TERM GOALS: Target date: 09/19/23  Patient will be independent in starter HEP to improve A/ROM tolerance and preserve available ROM of LLE and to advance A/ROM and strength of RLE.  Baseline: 08/20/23: defer to vists 2-3  Goal status: NEW  LONG TERM GOALS: Target date: 10/20/23  Patient will increase FOTO score to equal to or greater than 65%  to demonstrate statistically significant improvement in ease of mobility.  Baseline:  47%; 06/23/23: 60;   7/16: 60%  Goal status: NEW   2.  Patient (> 50 years old) will complete five times sit to stand test in < 15 seconds indicating an increased RLE power.  Baseline: 5/7: unable to tolerate ; 7/16: 17.4sec Goal status: NEW  3.   Patient to report tolerance of ad lib AMB in home during the day of distances >29ft >5x at supervision level or better to improve independence with ADL.    Baseline: 08/20/23: using transport chair for nearly all mobility, not tolerating walking due to WB limitations of LLE  Goal status: NEW    ASSESSMENT:  CLINICAL IMPRESSION:   Pt shows significant progress this session, continues to demonstrate improved tolerance to full ROM and standing.  Pt tolerated multiple bouts of standing with RW without pain in the L knee or s/s of orthostatic hypotension. Pt also able to advance gait distance to ~65ft without pain in the L.  The patient will benefit from skilled physical therapy to reduce pain, improve mobility, improve strength and improve quality of life.     OBJECTIVE IMPAIRMENTS: Abnormal gait, cardiopulmonary status limiting activity, decreased activity tolerance, decreased balance, decreased cognition, decreased endurance, decreased knowledge of use of DME, decreased mobility, difficulty walking, decreased ROM, decreased strength, decreased safety awareness, hypomobility, increased fascial restrictions, impaired perceived functional ability, impaired flexibility, impaired UE functional use, improper body mechanics, postural dysfunction, and pain.   ACTIVITY LIMITATIONS: carrying, lifting, bending, sitting, standing, squatting, stairs, transfers, bed mobility, bathing, toileting, dressing, reach over head, hygiene/grooming,  locomotion level, and caring for others  PARTICIPATION LIMITATIONS: meal prep, cleaning, laundry, medication management, personal finances, interpersonal relationship, driving, shopping, community activity, yard work, school, and church  PERSONAL FACTORS: Age, Behavior pattern, Education, Fitness, Past/current experiences, Time since onset of injury/illness/exacerbation, and 3+ comorbidities: anemia, arthritis, back pain, depression, hernia, GERD, HLD, HTN, hypothyroidism, knee pain, pancreatic hormone dysfunction, pneumonia, seizures, stroke, thyroid disease  are also affecting patient's functional outcome.   REHAB POTENTIAL: Fair    CLINICAL DECISION MAKING: Evolving/moderate complexity  EVALUATION COMPLEXITY: Moderate  PLAN:  PT FREQUENCY: 1-2x/week  PT DURATION: 12 weeks  PLANNED INTERVENTIONS: Therapeutic exercises, Therapeutic activity, Neuromuscular re-education, Balance training, Gait training,  Patient/Family education, Self Care, Joint mobilization, Joint manipulation, Stair training, Vestibular training, Canalith repositioning, Visual/preceptual remediation/compensation, Orthotic/Fit training, DME instructions, Electrical stimulation, Wheelchair mobility training, Spinal mobilization, Cryotherapy, Moist heat, Compression bandaging, scar mobilization, Splintting, Taping, Traction, Ultrasound, Manual therapy, and Re-evaluation  PLAN FOR NEXT SESSION:  Tolerance to standing in functional movement/ tasks.  Provide HEP    Golden Pop, PT, DPT 09/29/2023, 4:26 PM   4:26 PM, 09/29/23  Physical Therapist - [facility]  [phone] [facility])

## 2023-09-29 NOTE — Therapy (Signed)
Occupational Therapy Neuro Treatment Note    Patient Name: [name] MRN: [id number] DOB:25-Mar-1941, 82 y.o., female Today's Date: 09/29/2023  PCP: Dr. Judithann Sheen REFERRING PROVIDER:  Dr. Judithann Sheen  END OF SESSION:  PLAN:   OT End of Session - 09/29/23 1823     Visit Number 63    Number of Visits 96    Date for OT Re-Evaluation 11/12/23    OT Start Time 1535    OT Stop Time 1615    OT Time Calculation (min) 40 min    Activity Tolerance Patient tolerated treatment well    Behavior During Therapy WFL for tasks assessed/performed                   Past Medical History:  Diagnosis Date   Anemia    Arthritis    Asthma    uses inhaler just prior to surgery to avoid attack   Back pain    from previous injury   Complication of anesthesia    has woken  up during 2 different surgery   Depression    no current issue/treatment; situation   Gallstones    GERD (gastroesophageal reflux disease)    Hiatal hernia    patient does NOT have nerve/muscle disease   History of kidney stones    HLD (hyperlipidemia)    HTN (hypertension)    Hypothyroidism    Kidney stones    Knee pain    Nausea and vomiting 10/15/2022   Non-diabetic pancreatic hormone dysfunction years   pt. 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   Pancreatitis    Pneumonia    Seizures (HCC)    caused by dye injected during a procedure   Shortness of breath    with exertion   Sinus problem    frequent infections/congestion   Stroke (HCC) 2021   reports having CVA in 2021 and having mini strokes before that   Thyroid disease    Past Surgical History:  Procedure Laterality Date   ABDOMINAL HYSTERECTOMY     APPENDECTOMY     CARPAL TUNNEL RELEASE  10+ years ago   bilateral   EYE SURGERY  3 yrs ago   bilateral cataracts   FOOT OSTEOTOMY  6 weeks ago   Left foot: great, 2nd & 3rd   FOOT OSTEOTOMY  5 years ago   Right great toe   HAND SURGERY Bilateral 2011-most recent   multiple hand surgeries, 2  on left, 3 on right   KNEE ARTHROPLASTY Right 04/28/2022   Procedure: COMPUTER ASSISTED TOTAL KNEE ARTHROPLASTY;  Surgeon: Donato Heinz, MD;  Location: [facility];  Service: Orthopedics;  Laterality: Right;   LOOP RECORDER INSERTION N/A 05/16/2020   Procedure: LOOP RECORDER INSERTION;  Surgeon: Marcina Millard, MD;  Location: [facility];  Service: Cardiovascular;  Laterality: N/A;   NASAL SINUS SURGERY  most recent 7-8 yrs ago   7 sinus surgeries    TRIGGER FINGER RELEASE  11/19/2011   Procedure: RELEASE TRIGGER FINGER/A-1 PULLEY;  Surgeon: Nicki Reaper, MD;  Location: [facility];  Service: Orthopedics;  Laterality: Right;  release a-1 pulley right index finger and cyst removal   WRIST GANGLION EXCISION  1980's   right   Patient Active Problem List   Diagnosis Date Noted   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 11/27/2022   Hypomagnesemia 11/27/2022   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   HLD (hyperlipidemia) 11/23/2022   Asthma 11/23/2022   Depression with  anxiety 11/23/2022   Chronic diastolic CHF (congestive heart failure) (HCC) 11/23/2022   Normocytic anemia 11/23/2022   Aspiration pneumonia (HCC) 11/23/2022   AKI (acute kidney injury) (HCC) 11/23/2022   Abdominal pain 11/23/2022   Mesenteric mass 11/23/2022   Coffee ground emesis 11/23/2022   Nausea and vomiting 10/15/2022   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   Fatigue 09/17/2022   Left hemiparesis (HCC) 09/17/2022   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HCC) 08/22/2022   GERD (gastroesophageal reflux disease) 08/21/2022   Agitation 08/20/2022   Acute left-sided weakness 08/20/2022   Expressive aphasia    Stroke (HCC) 08/19/2022   Leukocytosis 08/19/2022   History of urticaria 04/28/2022   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   Primary osteoarthritis of left knee 02/24/2022   Primary osteoarthritis of right knee 02/24/2022   Lumbar spondylolysis 04/12/2020   History of CVA  (cerebrovascular accident) 03/26/2020   Low back pain radiating to right lower extremity 03/21/2020   B12 deficiency 03/06/2020   Positive anti-CCP test 12/21/2019   Arthralgia 12/13/2019   Dermatitis 12/13/2019   Rheumatoid factor positive 12/13/2019   Essential hypertension 12/11/2018   Palpitations 12/11/2018   Acquired hypothyroidism 11/10/2018   Arthritis of knee 09/17/2016   Anxiety 11/22/2014   Asthma without status asthmaticus 11/22/2014   Benign neoplasm of colon, unspecified 11/22/2014   Environmental allergies 11/22/2014   Hypertriglyceridemia 11/22/2014   Hypokalemia 11/22/2014   Personal history of disease of skin and subcutaneous tissue 11/22/2014   REFERRING DIAG: CVA   THERAPY DIAG:  Muscle weakness (generalized)   Other lack of coordination   Rationale for Evaluation and Treatment Rehabilitation   SUBJECTIVE:   SUBJECTIVE STATEMENT:  Pt. reports  that her 29 month old cat died unexpectedly 11-25-2023 night.  Pt accompanied by: significant other   PERTINENT HISTORY: Patient is an 82 year-old female who was admitted to [facility] on 11/23/2022 with a cerebellar CVA. 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 Pt. Was admitted to Inpatient Rehab from 12/21-1/02/2023. 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  Patient underwent inpatient rehabilitation for 2 weeks.  Patient was assessed and was scheduled for a knee replacement on 08/29/2022 however had to cancel it due to having had a CVA.  Patient had a recent fall 2 days after discharging from inpatient rehab. Past Medical History includes: Knee replacement, essential HTN, hypokalemia, leukocytosis, seizures, positive anti-- CCP test, anxiety disorder, mini strokes.  Patient had shingles with left eye nerve pain s/p 1 year ago.    PRECAUTIONS: Fall   WEIGHT BEARING RESTRICTIONS No   PAIN:  Are you having pain?  No   FALLS: Has  patient fallen in last 6 months? Yes. Number of falls 1   LIVING ENVIRONMENT: Lives with: Lives with Spouse Lives in: House/apartment Stairs: 2 storey home, resides on the first floor.  External: 2 stairs front no rails, and 6 in back with rails Has following equipment at home: Single point cane, Walker - 2 wheeled, Environmental consultant - 4 wheeled, Shower bench, and bed side commode   PLOF: Independent   PATIENT GOALS  To Regain the use of her left arm   OBJECTIVE:    HAND DOMINANCE: Right   ADLs: Overall ADLs: Husband assists pt. as needed Transfers/ambulation related to ADLs:Pt. Uses a 3 wheeled walker with Husband assist. Eating: Pt. Is independent with the right hand. Pt. has difficulty cutting food. Grooming: Pt. is using her right hand, however has difficulty sustaining her LUE in  elevation to assist with haircare. UB Dressing: Pt. Is independent donning a pullover shirt, and button down shirt. Has difficulty with buttoning, LB Dressing:  Independent donning pants, and socks. Difficulty tying shoes. Toileting: Independent Bathing: Pt. Is able to engage her right hand. Tub Shower transfers: Supervision Equipment: See above for equipment    IADLs: Shopping:  Has not had the opportunity for grocery shopping yet Light housekeeping: Husband is assisting with light house keeping Meal Prep:  Dependent Community mobility: Relies of family/friends Medication management: Husband assisting with weekly pillbox set-up, and administering medication. Financial management: TBD Handwriting: 75% legible   MOBILITY STATUS: Hx of falls   POSTURE COMMENTS:  No Significant postural limitations Sitting balance: supported sitting balance WFL   ACTIVITY TOLERANCE: Activity tolerance:  Fatigues in greater than 30 min.    FUNCTIONAL OUTCOME MEASURES: FOTO: 57   UPPER EXTREMITY ROM      Active ROM Right Eval: WFL Left eval Left  01/22/23 Left  03/03/23 Left  04/14/23 Left  05/28/2023 Left 07/23/2023  Left 08/20/2023 Left 09/17/23  Shoulder flexion   132 100 108 108 108 60(70) 70(78) 70(102)  Shoulder abduction   80 85 85 85 85 65(78) 72(85) 73(94)  Shoulder adduction             Shoulder extension             Shoulder internal rotation             Shoulder external rotation             Elbow flexion   140 140 WFL [facility] WFL  Elbow extension   WNL WNL [facility] WFL WFL  Wrist flexion   65 68        Wrist extension   -10 20 24  32 32 -10 20 20   Wrist ulnar deviation     12 10 14 14 10 18 18   Wrist radial deviation     8 14 14 16 12 14 14   Wrist pronation             Wrist supination             (Blank rows = not tested)   Left digit flexion to [facility]: 2nd: 0cm, 3rd: 0cm, 4th: 0cm, 5th: 0cm   Limited Left full 2nd digit extension     UPPER EXTREMITY MMT:      MMT Right Eval: 4+/5 overall Right 07/23/2023 Left Eval Left 01/22/23 Left 03/03/23 Left  04/14/2023 Left 05/28/2023 Left 07/23/2023 Left 08/20/2023 Left 09/17/23  Shoulder flexion   3+/5 3/5 3-/5 3-/5 3-/5 3-/5 2/5 2+/5 2+/5  Shoulder abduction   3+/5 3-/5 3-/5 3-/5 3-/5 3-/5 2/5 2+/5 2+/5  Shoulder adduction              Shoulder extension              Shoulder internal rotation              Shoulder external rotation              Middle trapezius              Lower trapezius              Elbow flexion   4/5 3+/5 4/5 N/A 4/5 4/5 3+/5 3+/5 3+/5  Elbow extension   4/5 3+/5 4/4 N/A 4/5 4/5 3+/5 3+/5 4-/5  Wrist flexion  Wrist extension   4/5 2-/5 3-/5 3-/5 3/5 3+/5 3-/5 3+/5 3+/5  Wrist ulnar deviation              Wrist radial deviation              Wrist pronation              Wrist supination              (Blank rows = not tested)   HAND FUNCTION: Grip strength: Right: 26#, Left: 10# Pinch strength: Right 8#, Left: 3#, 3 Pt. Pinch strength: Right: 9#, L: 2#  01/22/2023 Grip strength: Right: 26#, Left: 12# Pinch strength:  Pinch meter used at the initial eval has been sent out for  recalibration   03/03/2023 Grip strength: Right: 26#, Left: 13# Pinch strength:  Pinch meter used at the initial eval has been sent out for recalibration  04/14/2023: Grip strength: Right: 26#, Left: 13# Pinch strength:   Right 8#, Left: 4#, 3 Pt. Pinch strength: Right: 9#, L: 3#    05/28/2023: Grip strength: Right: 26#, Left: 13# Pinch strength:   Right 8#, Left: 2#, 3 Pt. Pinch strength: Right: 9#, L: 4#  07/23/2023  Grip strength: Right: 5#, Left: 2# Pinch strength:   Right 6#, Left: 2#, 3 Pt. Pinch strength: Right: 5#, L: 2#  08/20/2023:    Grip strength: Right: 8#, Left: 4# Pinch strength:   Right 6#, Left: 2#, 3 Pt. Pinch strength: Right: 5#, L: 2#    09/17/23:  Grip strength: Right: 5#, Left: 4# Pinch strength:   Right 6#, Left: 2#, 3 Pt. Pinch strength: Right: 5#, L: 2#   COORDINATION: Right: 22 sec., Left: <5 min. To place 7 pegs with increased compensation proximally in the trunk, and through reflexive associated reactions.  01/22/23 Right: 22 sec., Left: 3 min. & 4 sec.  03/03/23 Right: 22 sec., Left: 1 min. & 39 sec.  04/14/23   TBD  05/28/23 Right: 22 sec., Left:  5 min.  07/23/2023  Right: 33 sec., Left:  Pt. Is unable to grasp, and place pegs into the pegboard. Pt. Was able to remove 9 vertical pegs in 34 sec.  08/20/2023:  Right: 27 sec., Left:  5 pegs placed in 5 min.; Pt. Was able to remove 9 vertical pegs in 28 sec.  09/17/23:  Right: 26 sec., Left:  3 min, & 49 sec. pegs placed in 5 min.; Pt. Was able to remove 9 vertical pegs in 24 sec.         SENSATION: Light touch: WFL, proprioceptive awareness: Intact   EDEMA: N/A   MUSCLE TONE: LUE: Hypotonic   COGNITION: Overall cognitive status: WFL for tasks assessed. Pt. Is impulsive at times.   VISION: Subjective report: Pt. report having shingles affecting left eye  s/p 1 year. Has nerve pain Baseline vision: Wears glasses for reading only Visual history:  updated see clinical impression    VISION ASSESSMENT:    WFL for tasks performed   PERCEPTION: Intact   PRAXIS: Impaired: Motor planning   OBSERVATIONS:  Pt. more alert, and engaging since prior to the most recent hospitalization.   TODAY'S TREATMENT:    There. Ex.:   Pt. tolerated PROM/AAROM to the Left shoulder, elbow, wrist, and digits. Pt. worked on [employer], and reciprocal motion using the UBE while seated for 8 min. with no resistance. Constant monitoring was provided.  Pt. worked on pinch strengthening in the left hand for lateral, and 3pt. pinch  using yellow, and red resistive clips. Pt. worked on placing the clips at various vertical and horizontal angles. Tactile and verbal cues were required for eliciting the desired movement.        PATIENT EDUCATION: Education details: LUE therapeutic exercise Person educated: Patient and Spouse Education method: Medical illustrator Education comprehension: verbalized understanding, returned demonstration, and needs further education   HOME EXERCISE PROGRAM:   Reviewed activities at home to promote isolated 2nd digit extension.    GOALS: Goals reviewed with patient? Yes   SHORT TERM GOALS: Target date: 10/01/2023       1. Patient will be independent with home exercise program for the left upper extremity Baseline: 09/17/23: Independent 08/20/23: Pt. continues to require assist from he husband for HEPs 07/23/2023: Pt. continues to require assist. 05/28/2023: Pt. Requires assist 04/09/2023: Pt. Continues to consistently attempt to engage her hand at hand 02/01/2023: Pt. Consistently attempts to perform HEPs independently. No current home exercise program Goal status: Ongoing   LONG TERM GOALS: Target date:  08/20/2023   1.  Patient will improve left shoulder strength by 2 mm grades to be able to sustain UE's in elevation long enough to wash her hair.  Baseline: 09/17/2023: Left shoulder 2+/5, abduction 2+/5 08/20/2023: Left shoulder flexion 2+/5,  abduction 2+/5  07/23/2023: Left shoulder flexion: 2/5, abduction: 2/5. 05/28/2023: Left shoulder flexion: 3-/5, abduction: 3-/5. Pt. has an old left shoulder injury limiting progression with left shoulder strength 05/26/2023: Pt. continues to present with limitiations in sustaining LUE elevation long enough to perform hair care thoroughly 04/09/2023: Pt. is limited with sustaining LUE elevation long enough to perform hair care thoroughly. 03/03/2023: Left shoulder flexion: 3-/5, abduction: 3-/5 01/22/2023: Left shoulder flexion: 3-/5, abduction: 3-/5 Eval: Left shoulder flexion: 3/5, abduction: 3-/5 Goal status: Ongoing   2.  Patient will improve left shoulder active abduction to be able to comb her hair Baseline: 09/17/2023: 73(94) Pt. is able to reach her left side, and back of hair. 9/11/09/2023: 72(85) Pt. is able to reach her left side, and back of hair. 07/23/2023: Left shoulder flexion: 60(70) abduction: 65(78) 05/28/2023: left shoulder flexion: 108 abduction: 85 Pt. Has an old left shoulder injury limiting progression with left shoulder ROM 05/26/2023:  5/10 left shoulder pain with ROM limits using it functionally during hair care. 04/09/2023: Pt. Presents with difficulty abducting her left shoulder enough to thoroughly complete haircare. 03/03/2023: Shoulder abduction: 85 01/22/2023: Shoulder abduction: 85 Eval: Left shoulder abduction is 80(108) Goal status: Improving, Ongoing   3.  Patient will independently button her shirt with modified independence. Baseline:09/17/2023: Pt. Has a buttonhook, however does not know where it is. Pt. requires increased time to complete, and assist from her husband. 08/20/2023: Pt. requires increased time to complete, and assist from her husband. 07/23/2023 Pt. presents with difficulty buttoning her shirt. 05/28/2023: pt. Continues to work towards progressing with buttoning 05/26/2023:  Pt. Continues to progress towards buttoning. 04/09/2023: Pt. Continues to progress towards  buttoning. 03/03/2023: Pt. Continues to have difficulty with buttoning. 01/22/2023: Pt. continues to have difficulty. Eval: Patient has difficulty.  Goal status: Ongoing   4.  Patient well improve left grip strength in preparation for securely hold a glass/beverage Baseline: 09/17/2023: Grip strength: Right: 5#, Left: 4# Pt. Has difficulty holding a glass. Right: 8#, Left: 4# Pt. Is able to more securely hold mirrors, and flowers with the left hand, however has difficulty holding a glass beverage, or bottle. 08/20/2023: Right: 8#, Left: 4# Pt. Is able to more securely  hold mirrors, and flowers with the left hand, however has difficulty holding a glass beverage, or bottle. 07/23/2023: Grip strength: Right: 5#, Left: 2# 05/28/2023: Right: 26#, Left: 13#  pt. Presents with difficulty securely holding flowers in her left hand. 05/26/2023: TBD 04/09/2023: Pt. Is able to hold, and hike pants with the left hand, continues to have difficulty with securely holding flowers.   03/03/2023: left grip strength: 13# 01/22/2023: Left: 12# Eval: Pt. Is unable to securely hold flowers. Goal status: Ongoing   5.  Pt. will independently recall adaptive  strategies for performing ADL/ADL kitchen tasks.  Baseline: 08/20/2023: Revised to include IADL kitchen tasks.  07/23/2023: Continue 05/28/2023: Pt. Needs continued education about adaptive strategies.05/26/2023: Continue 04/09/2023: Continue3/26/2024: Continue 01/22/2023: Pt. continues to benefit from education about adaptive strategies during ADLs, and IADLs. Eval: Pt. to be provided with adaptive strategies. Goal status: Revised   6.  Pt. will improve FOTO score by 2 points to reflect patient perceived performance improvement assessment specific ADLs  and IADLs Baseline: 57/84/6962: FOTO score: 61 07/23/2023: FOTO score: 57 05/28/2023: FOTO score 59 05/26/2023: TBD 5/02/204:  TBD 03/03/23: FOTO 62 Eval: 57 Goal status: Ongoing  7.  Pt. will improve left hand coordination skills in order  to be able to  manipulate small objects.     Baseline: 09/17/2023: Right: 26 sec., Left:  3 min, & 49 sec. pegs placed in 5 min.; Pt. Was able to remove 9 vertical pegs in 24 sec. 08/20/2023: Right: 27 sec., Left:  5 pegs placed in 5 min.; Pt. Was able to remove 9 vertical pegs in 28 sec. Pt. Is able to hold, and sort utensils in a drawer. Pt. Has difficulty manipulating small objects. 07/23/2023: Right: 33 sec., Left:  Pt. Is unable to grasp, and place pegs into the pegboard. Pt. Was able to remove 9 vertical pegs in 34 sec. 05/28/2023: Left:  5 min. 05/26/2023 Pt. Continues to present with difficulty handling and sorting utensils. 04/14/2023: 56 04/09/2023: TBD 03/03/23: 1 min. & 39 01/22/2022: Left: 3 min. & 4 sec. Eval: Pt. has difficulty sorting, and placing utensils with the left hand. Left FMC : >5 min. For 7 pegs on the 9 hole peg test.    Goal Status: Improving, Revised to be able to improve left hand FMC to be able to manipulate  small objects   8. Pt. will improve active left 2nd digit extension to be able able to isolate her 2nd digit in preparation for pressing/pushing buttons on appliances, phones, or remotes. Baseline: 09/17/2023: Pt. Presents with limited isolated 2nd digit extension with increased time. 08/20/2023: Pt. Is more consistently able to isolate 2nd digit extension with increased time. 07/23/2023: Pt. Continues to work on consistency with isolating left 2nd digit extension 05/28/2023: Pt. Continues to improve with left 2nd digit extension 05/26/2023: Improving with left 2nd digit extension. 04/09/2023: Pt. Is progressing with isolating left 2nd digit extension, however continues to present with limited increased flexor tone. 03/03/23: Pt. Continues to work on improving consistency with 2nd digit extension to press the remote. 01/22/2023: is able to perform full digit extension, although 2nd digit is slow to extend s 2/2 flexor tone. Pt.  Eval: Pt. is able to is unable to actively perform full digit  extension Goal status: Ongoing  9: Pt. Will cut food with modified independence    Baseline: 09/17/2023: Pt. Is unable to cut the food on her plate, as well as cut food for meal preparation. 08/20/2023: Pt. Is unable to cut  the food on her plate, as well as cut food for meal preparation.    Goal Status: Ongoing   ASSESSMENT:   CLINICAL IMPRESSION:  Pt. reports being sad about the unexpected passing of her 18 month old kitten. Pt. tolerated Left UE ROM, and UE exercise on the UBE well without reports of increased pain. Pt. required verbal, visual, and tactile cues for proper form when completing 3pt. pinch, and lateral pinch on the resistive clips. Pt. tolerated the whole session without fatigue. Pt. continues to work on improving LE UE ROM, strength, FMC, and hand function skills in order to work towards improving, and maximizing independence with ADLs, and IADL tasks.    PERFORMANCE DEFICITS in functional skills including ADLs, IADLs, coordination, proprioception, ROM, strength, FMC, and GMC, cognitive skills including memory, and psychosocial skills including coping strategies, environmental adaptation, interpersonal interactions, and routines and behaviors.    IMPAIRMENTS are limiting patient from ADLs, IADLs, education, leisure, and social participation.    COMORBIDITIES may have co-morbidities  that affects occupational performance. Patient will benefit from skilled OT to address above impairments and improve overall function.   MODIFICATION OR ASSISTANCE TO COMPLETE EVALUATION: Min-Moderate modification of tasks or assist with assess necessary to complete an evaluation.   OT OCCUPATIONAL PROFILE AND HISTORY: Detailed assessment: Review of records and additional review of physical, cognitive, psychosocial history related to current functional performance.   CLINICAL DECISION MAKING: Moderate - several treatment options, min-mod task modification necessary   REHAB POTENTIAL: Good    EVALUATION COMPLEXITY: Moderate      PLAN: OT FREQUENCY: 2x/week   OT DURATION: 12 weeks   PLANNED INTERVENTIONS: self care/ADL training, therapeutic exercise, therapeutic activity, neuromuscular re-education, manual therapy, passive range of motion, functional mobility training, electrical stimulation, and paraffin   RECOMMENDED OTHER SERVICES: PT   CONSULTED AND AGREED WITH PLAN OF CARE: Patient and family member/caregiver   PLAN FOR NEXT SESSION: see above    Olegario Messier, MS, OTR/L  09/29/2023

## 2023-10-01 ENCOUNTER — Ambulatory Visit: Payer: Medicare HMO | Admitting: Physical Therapy

## 2023-10-01 ENCOUNTER — Ambulatory Visit: Payer: Medicare HMO | Admitting: Occupational Therapy

## 2023-10-01 DIAGNOSIS — M6281 Muscle weakness (generalized): Secondary | ICD-10-CM | POA: Diagnosis not present

## 2023-10-01 DIAGNOSIS — I6381 Other cerebral infarction due to occlusion or stenosis of small artery: Secondary | ICD-10-CM | POA: Diagnosis not present

## 2023-10-01 DIAGNOSIS — R2681 Unsteadiness on feet: Secondary | ICD-10-CM

## 2023-10-01 DIAGNOSIS — R531 Weakness: Secondary | ICD-10-CM

## 2023-10-01 DIAGNOSIS — R278 Other lack of coordination: Secondary | ICD-10-CM | POA: Diagnosis not present

## 2023-10-01 DIAGNOSIS — R262 Difficulty in walking, not elsewhere classified: Secondary | ICD-10-CM | POA: Diagnosis not present

## 2023-10-01 NOTE — Therapy (Signed)
Occupational Therapy Neuro Treatment Note    Patient Name: [name] MRN: [id number] DOB:30-Jan-1941, 82 y.o., female Today's Date: 10/01/2023  PCP: Dr. Judithann Sheen REFERRING PROVIDER:  Dr. Judithann Sheen  END OF SESSION:  PLAN:   OT End of Session - 10/01/23 1721     Visit Number 64    Number of Visits 96    Date for OT Re-Evaluation 11/12/23    OT Start Time 1530    OT Stop Time 1615    OT Time Calculation (min) 45 min    Activity Tolerance Patient tolerated treatment well    Behavior During Therapy WFL for tasks assessed/performed                   Past Medical History:  Diagnosis Date   Anemia    Arthritis    Asthma    uses inhaler just prior to surgery to avoid attack   Back pain    from previous injury   Complication of anesthesia    has woken  up during 2 different surgery   Depression    no current issue/treatment; situation   Gallstones    GERD (gastroesophageal reflux disease)    Hiatal hernia    patient does NOT have nerve/muscle disease   History of kidney stones    HLD (hyperlipidemia)    HTN (hypertension)    Hypothyroidism    Kidney stones    Knee pain    Nausea and vomiting 10/15/2022   Non-diabetic pancreatic hormone dysfunction years   pt. 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   Pancreatitis    Pneumonia    Seizures (HCC)    caused by dye injected during a procedure   Shortness of breath    with exertion   Sinus problem    frequent infections/congestion   Stroke (HCC) 2021   reports having CVA in 2021 and having mini strokes before that   Thyroid disease    Past Surgical History:  Procedure Laterality Date   ABDOMINAL HYSTERECTOMY     APPENDECTOMY     CARPAL TUNNEL RELEASE  10+ years ago   bilateral   EYE SURGERY  3 yrs ago   bilateral cataracts   FOOT OSTEOTOMY  6 weeks ago   Left foot: great, 2nd & 3rd   FOOT OSTEOTOMY  5 years ago   Right great toe   HAND SURGERY Bilateral 2011-most recent   multiple hand surgeries, 2  on left, 3 on right   KNEE ARTHROPLASTY Right 04/28/2022   Procedure: COMPUTER ASSISTED TOTAL KNEE ARTHROPLASTY;  Surgeon: Donato Heinz, MD;  Location: [facility];  Service: Orthopedics;  Laterality: Right;   LOOP RECORDER INSERTION N/A 05/16/2020   Procedure: LOOP RECORDER INSERTION;  Surgeon: Marcina Millard, MD;  Location: [facility];  Service: Cardiovascular;  Laterality: N/A;   NASAL SINUS SURGERY  most recent 7-8 yrs ago   7 sinus surgeries    TRIGGER FINGER RELEASE  11/19/2011   Procedure: RELEASE TRIGGER FINGER/A-1 PULLEY;  Surgeon: Nicki Reaper, MD;  Location: [facility];  Service: Orthopedics;  Laterality: Right;  release a-1 pulley right index finger and cyst removal   WRIST GANGLION EXCISION  1980's   right   Patient Active Problem List   Diagnosis Date Noted   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 11/27/2022   Hypomagnesemia 11/27/2022   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   HLD (hyperlipidemia) 11/23/2022   Asthma 11/23/2022   Depression with  anxiety 11/23/2022   Chronic diastolic CHF (congestive heart failure) (HCC) 11/23/2022   Normocytic anemia 11/23/2022   Aspiration pneumonia (HCC) 11/23/2022   AKI (acute kidney injury) (HCC) 11/23/2022   Abdominal pain 11/23/2022   Mesenteric mass 11/23/2022   Coffee ground emesis 11/23/2022   Nausea and vomiting 10/15/2022   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   Fatigue 09/17/2022   Left hemiparesis (HCC) 09/17/2022   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HCC) 08/22/2022   GERD (gastroesophageal reflux disease) 08/21/2022   Agitation 08/20/2022   Acute left-sided weakness 08/20/2022   Expressive aphasia    Stroke (HCC) 08/19/2022   Leukocytosis 08/19/2022   History of urticaria 04/28/2022   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   Primary osteoarthritis of left knee 02/24/2022   Primary osteoarthritis of right knee 02/24/2022   Lumbar spondylolysis 04/12/2020   History of CVA  (cerebrovascular accident) 03/26/2020   Low back pain radiating to right lower extremity 03/21/2020   B12 deficiency 03/06/2020   Positive anti-CCP test 12/21/2019   Arthralgia 12/13/2019   Dermatitis 12/13/2019   Rheumatoid factor positive 12/13/2019   Essential hypertension 12/11/2018   Palpitations 12/11/2018   Acquired hypothyroidism 11/10/2018   Arthritis of knee 09/17/2016   Anxiety 11/22/2014   Asthma without status asthmaticus 11/22/2014   Benign neoplasm of colon, unspecified 11/22/2014   Environmental allergies 11/22/2014   Hypertriglyceridemia 11/22/2014   Hypokalemia 11/22/2014   Personal history of disease of skin and subcutaneous tissue 11/22/2014   REFERRING DIAG: CVA   THERAPY DIAG:  Muscle weakness (generalized)   Other lack of coordination   Rationale for Evaluation and Treatment Rehabilitation   SUBJECTIVE:   SUBJECTIVE STATEMENT:  Pt. reports that her husband dropped her off at therapy in order to go, and help his cousin  Pt accompanied by: significant other   PERTINENT HISTORY: Patient is an 82 year-old female who was admitted to [facility] on 11/23/2022 with a cerebellar CVA. 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 Pt. Was admitted to Inpatient Rehab from 12/21-1/02/2023. 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  Patient underwent inpatient rehabilitation for 2 weeks.  Patient was assessed and was scheduled for a knee replacement on 08/29/2022 however had to cancel it due to having had a CVA.  Patient had a recent fall 2 days after discharging from inpatient rehab. Past Medical History includes: Knee replacement, essential HTN, hypokalemia, leukocytosis, seizures, positive anti-- CCP test, anxiety disorder, mini strokes.  Patient had shingles with left eye nerve pain s/p 1 year ago.    PRECAUTIONS: Fall   WEIGHT BEARING RESTRICTIONS No   PAIN:  Are you having pain?   No   FALLS: Has patient fallen in last 6 months? Yes. Number of falls 1   LIVING ENVIRONMENT: Lives with: Lives with Spouse Lives in: House/apartment Stairs: 2 storey home, resides on the first floor.  External: 2 stairs front no rails, and 6 in back with rails Has following equipment at home: Single point cane, Walker - 2 wheeled, Environmental consultant - 4 wheeled, Shower bench, and bed side commode   PLOF: Independent   PATIENT GOALS  To Regain the use of her left arm   OBJECTIVE:    HAND DOMINANCE: Right   ADLs: Overall ADLs: Husband assists pt. as needed Transfers/ambulation related to ADLs:Pt. Uses a 3 wheeled walker with Husband assist. Eating: Pt. Is independent with the right hand. Pt. has difficulty cutting food. Grooming: Pt. is using her right hand, however has  difficulty sustaining her LUE in elevation to assist with haircare. UB Dressing: Pt. Is independent donning a pullover shirt, and button down shirt. Has difficulty with buttoning, LB Dressing:  Independent donning pants, and socks. Difficulty tying shoes. Toileting: Independent Bathing: Pt. Is able to engage her right hand. Tub Shower transfers: Supervision Equipment: See above for equipment    IADLs: Shopping:  Has not had the opportunity for grocery shopping yet Light housekeeping: Husband is assisting with light house keeping Meal Prep:  Dependent Community mobility: Relies of family/friends Medication management: Husband assisting with weekly pillbox set-up, and administering medication. Financial management: TBD Handwriting: 75% legible   MOBILITY STATUS: Hx of falls   POSTURE COMMENTS:  No Significant postural limitations Sitting balance: supported sitting balance WFL   ACTIVITY TOLERANCE: Activity tolerance:  Fatigues in greater than 30 min.    FUNCTIONAL OUTCOME MEASURES: FOTO: 57   UPPER EXTREMITY ROM      Active ROM Right Eval: WFL Left eval Left  01/22/23 Left  03/03/23 Left  04/14/23 Left   05/28/2023 Left 07/23/2023 Left 08/20/2023 Left 09/17/23  Shoulder flexion   132 100 108 108 108 60(70) 70(78) 70(102)  Shoulder abduction   80 85 85 85 85 65(78) 72(85) 73(94)  Shoulder adduction             Shoulder extension             Shoulder internal rotation             Shoulder external rotation             Elbow flexion   140 140 WFL [facility] WFL  Elbow extension   WNL WNL [facility] WFL WFL  Wrist flexion   65 68        Wrist extension   -10 20 24  32 32 -10 20 20   Wrist ulnar deviation     12 10 14 14 10 18 18   Wrist radial deviation     8 14 14 16 12 14 14   Wrist pronation             Wrist supination             (Blank rows = not tested)   Left digit flexion to [facility]: 2nd: 0cm, 3rd: 0cm, 4th: 0cm, 5th: 0cm   Limited Left full 2nd digit extension     UPPER EXTREMITY MMT:      MMT Right Eval: 4+/5 overall Right 07/23/2023 Left Eval Left 01/22/23 Left 03/03/23 Left  04/14/2023 Left 05/28/2023 Left 07/23/2023 Left 08/20/2023 Left 09/17/23  Shoulder flexion   3+/5 3/5 3-/5 3-/5 3-/5 3-/5 2/5 2+/5 2+/5  Shoulder abduction   3+/5 3-/5 3-/5 3-/5 3-/5 3-/5 2/5 2+/5 2+/5  Shoulder adduction              Shoulder extension              Shoulder internal rotation              Shoulder external rotation              Middle trapezius              Lower trapezius              Elbow flexion   4/5 3+/5 4/5 N/A 4/5 4/5 3+/5 3+/5 3+/5  Elbow extension   4/5 3+/5 4/4 N/A 4/5 4/5 3+/5 3+/5 4-/5  Wrist flexion  Wrist extension   4/5 2-/5 3-/5 3-/5 3/5 3+/5 3-/5 3+/5 3+/5  Wrist ulnar deviation              Wrist radial deviation              Wrist pronation              Wrist supination              (Blank rows = not tested)   HAND FUNCTION: Grip strength: Right: 26#, Left: 10# Pinch strength: Right 8#, Left: 3#, 3 Pt. Pinch strength: Right: 9#, L: 2#  01/22/2023 Grip strength: Right: 26#, Left: 12# Pinch strength:  Pinch meter used at the initial eval  has been sent out for recalibration   03/03/2023 Grip strength: Right: 26#, Left: 13# Pinch strength:  Pinch meter used at the initial eval has been sent out for recalibration  04/14/2023: Grip strength: Right: 26#, Left: 13# Pinch strength:   Right 8#, Left: 4#, 3 Pt. Pinch strength: Right: 9#, L: 3#    05/28/2023: Grip strength: Right: 26#, Left: 13# Pinch strength:   Right 8#, Left: 2#, 3 Pt. Pinch strength: Right: 9#, L: 4#  07/23/2023  Grip strength: Right: 5#, Left: 2# Pinch strength:   Right 6#, Left: 2#, 3 Pt. Pinch strength: Right: 5#, L: 2#  08/20/2023:    Grip strength: Right: 8#, Left: 4# Pinch strength:   Right 6#, Left: 2#, 3 Pt. Pinch strength: Right: 5#, L: 2#    09/17/23:  Grip strength: Right: 5#, Left: 4# Pinch strength:   Right 6#, Left: 2#, 3 Pt. Pinch strength: Right: 5#, L: 2#   COORDINATION: Right: 22 sec., Left: <5 min. To place 7 pegs with increased compensation proximally in the trunk, and through reflexive associated reactions.  01/22/23 Right: 22 sec., Left: 3 min. & 4 sec.  03/03/23 Right: 22 sec., Left: 1 min. & 39 sec.  04/14/23   TBD  05/28/23 Right: 22 sec., Left:  5 min.  07/23/2023  Right: 33 sec., Left:  Pt. Is unable to grasp, and place pegs into the pegboard. Pt. Was able to remove 9 vertical pegs in 34 sec.  08/20/2023:  Right: 27 sec., Left:  5 pegs placed in 5 min.; Pt. Was able to remove 9 vertical pegs in 28 sec.  09/17/23:  Right: 26 sec., Left:  3 min, & 49 sec. pegs placed in 5 min.; Pt. Was able to remove 9 vertical pegs in 24 sec.         SENSATION: Light touch: WFL, proprioceptive awareness: Intact   EDEMA: N/A   MUSCLE TONE: LUE: Hypotonic   COGNITION: Overall cognitive status: WFL for tasks assessed. Pt. Is impulsive at times.   VISION: Subjective report: Pt. report having shingles affecting left eye  s/p 1 year. Has nerve pain Baseline vision: Wears glasses for reading only Visual history:  updated see  clinical impression   VISION ASSESSMENT:    WFL for tasks performed   PERCEPTION: Intact   PRAXIS: Impaired: Motor planning   OBSERVATIONS:  Pt. more alert, and engaging since prior to the most recent hospitalization.   TODAY'S TREATMENT:    There. Ex.:   Pt. tolerated PROM/AAROM to the Left shoulder, elbow, wrist, and digits. Pt. worked on [employer], and reciprocal motion using the UBE while seated for 8 min. with no resistance. Constant monitoring was provided.  Pt. worked on pinch strengthening in the left hand for lateral, and 3pt. pinch  using yellow, and red resistive clips. Pt. worked on placing the clips at various vertical and horizontal angles. Tactile and verbal cues were required for eliciting the desired movement.  Neuromuscular re-education:  Pt. worked on left hand [facility] skills grasping 1" resistive cubes alternating thumb opposition to the tip of the 2nd through 5th digits while the board is placed at a vertical angle. Pt. worked on pressing the cubes back into place while performing isolated 2nd digit extension.         PATIENT EDUCATION: Education details: LUE therapeutic exercise Person educated: Patient and Spouse Education method: Medical illustrator Education comprehension: verbalized understanding, returned demonstration, and needs further education   HOME EXERCISE PROGRAM:   Reviewed activities at home to promote isolated 2nd digit extension.    GOALS: Goals reviewed with patient? Yes   SHORT TERM GOALS: Target date: 10/01/2023       1. Patient will be independent with home exercise program for the left upper extremity Baseline: 09/17/23: Independent 08/20/23: Pt. continues to require assist from he husband for HEPs 07/23/2023: Pt. continues to require assist. 05/28/2023: Pt. Requires assist 04/09/2023: Pt. Continues to consistently attempt to engage her hand at hand 02/01/2023: Pt. Consistently attempts to perform HEPs independently. No  current home exercise program Goal status: Ongoing   LONG TERM GOALS: Target date:  08/20/2023   1.  Patient will improve left shoulder strength by 2 mm grades to be able to sustain UE's in elevation long enough to wash her hair.  Baseline: 09/17/2023: Left shoulder 2+/5, abduction 2+/5 08/20/2023: Left shoulder flexion 2+/5, abduction 2+/5  07/23/2023: Left shoulder flexion: 2/5, abduction: 2/5. 05/28/2023: Left shoulder flexion: 3-/5, abduction: 3-/5. Pt. has an old left shoulder injury limiting progression with left shoulder strength 05/26/2023: Pt. continues to present with limitiations in sustaining LUE elevation long enough to perform hair care thoroughly 04/09/2023: Pt. is limited with sustaining LUE elevation long enough to perform hair care thoroughly. 03/03/2023: Left shoulder flexion: 3-/5, abduction: 3-/5 01/22/2023: Left shoulder flexion: 3-/5, abduction: 3-/5 Eval: Left shoulder flexion: 3/5, abduction: 3-/5 Goal status: Ongoing   2.  Patient will improve left shoulder active abduction to be able to comb her hair Baseline: 09/17/2023: 73(94) Pt. is able to reach her left side, and back of hair. 9/11/09/2023: 72(85) Pt. is able to reach her left side, and back of hair. 07/23/2023: Left shoulder flexion: 60(70) abduction: 65(78) 05/28/2023: left shoulder flexion: 108 abduction: 85 Pt. Has an old left shoulder injury limiting progression with left shoulder ROM 05/26/2023:  5/10 left shoulder pain with ROM limits using it functionally during hair care. 04/09/2023: Pt. Presents with difficulty abducting her left shoulder enough to thoroughly complete haircare. 03/03/2023: Shoulder abduction: 85 01/22/2023: Shoulder abduction: 85 Eval: Left shoulder abduction is 80(108) Goal status: Improving, Ongoing   3.  Patient will independently button her shirt with modified independence. Baseline:09/17/2023: Pt. Has a buttonhook, however does not know where it is. Pt. requires increased time to complete, and assist from  her husband. 08/20/2023: Pt. requires increased time to complete, and assist from her husband. 07/23/2023 Pt. presents with difficulty buttoning her shirt. 05/28/2023: pt. Continues to work towards progressing with buttoning 05/26/2023:  Pt. Continues to progress towards buttoning. 04/09/2023: Pt. Continues to progress towards buttoning. 03/03/2023: Pt. Continues to have difficulty with buttoning. 01/22/2023: Pt. continues to have difficulty. Eval: Patient has difficulty.  Goal status: Ongoing   4.  Patient well improve left grip strength in preparation for securely hold a glass/beverage  Baseline: 09/17/2023: Grip strength: Right: 5#, Left: 4# Pt. Has difficulty holding a glass. Right: 8#, Left: 4# Pt. Is able to more securely hold mirrors, and flowers with the left hand, however has difficulty holding a glass beverage, or bottle. 08/20/2023: Right: 8#, Left: 4# Pt. Is able to more securely hold mirrors, and flowers with the left hand, however has difficulty holding a glass beverage, or bottle. 07/23/2023: Grip strength: Right: 5#, Left: 2# 05/28/2023: Right: 26#, Left: 13#  pt. Presents with difficulty securely holding flowers in her left hand. 05/26/2023: TBD 04/09/2023: Pt. Is able to hold, and hike pants with the left hand, continues to have difficulty with securely holding flowers.   03/03/2023: left grip strength: 13# 01/22/2023: Left: 12# Eval: Pt. Is unable to securely hold flowers. Goal status: Ongoing   5.  Pt. will independently recall adaptive  strategies for performing ADL/ADL kitchen tasks.  Baseline: 08/20/2023: Revised to include IADL kitchen tasks.  07/23/2023: Continue 05/28/2023: Pt. Needs continued education about adaptive strategies.05/26/2023: Continue 04/09/2023: Continue3/26/2024: Continue 01/22/2023: Pt. continues to benefit from education about adaptive strategies during ADLs, and IADLs. Eval: Pt. to be provided with adaptive strategies. Goal status: Revised   6.  Pt. will improve FOTO score by 2  points to reflect patient perceived performance improvement assessment specific ADLs  and IADLs Baseline: 40/98/1191: FOTO score: 61 07/23/2023: FOTO score: 57 05/28/2023: FOTO score 59 05/26/2023: TBD 5/02/204:  TBD 03/03/23: FOTO 62 Eval: 57 Goal status: Ongoing  7.  Pt. will improve left hand coordination skills in order to be able to  manipulate small objects.     Baseline: 09/17/2023: Right: 26 sec., Left:  3 min, & 49 sec. pegs placed in 5 min.; Pt. Was able to remove 9 vertical pegs in 24 sec. 08/20/2023: Right: 27 sec., Left:  5 pegs placed in 5 min.; Pt. Was able to remove 9 vertical pegs in 28 sec. Pt. Is able to hold, and sort utensils in a drawer. Pt. Has difficulty manipulating small objects. 07/23/2023: Right: 33 sec., Left:  Pt. Is unable to grasp, and place pegs into the pegboard. Pt. Was able to remove 9 vertical pegs in 34 sec. 05/28/2023: Left:  5 min. 05/26/2023 Pt. Continues to present with difficulty handling and sorting utensils. 04/14/2023: 56 04/09/2023: TBD 03/03/23: 1 min. & 39 01/22/2022: Left: 3 min. & 4 sec. Eval: Pt. has difficulty sorting, and placing utensils with the left hand. Left FMC : >5 min. For 7 pegs on the 9 hole peg test.    Goal Status: Improving, Revised to be able to improve left hand FMC to be able to manipulate  small objects   8. Pt. will improve active left 2nd digit extension to be able able to isolate her 2nd digit in preparation for pressing/pushing buttons on appliances, phones, or remotes. Baseline: 09/17/2023: Pt. Presents with limited isolated 2nd digit extension with increased time. 08/20/2023: Pt. Is more consistently able to isolate 2nd digit extension with increased time. 07/23/2023: Pt. Continues to work on consistency with isolating left 2nd digit extension 05/28/2023: Pt. Continues to improve with left 2nd digit extension 05/26/2023: Improving with left 2nd digit extension. 04/09/2023: Pt. Is progressing with isolating left 2nd digit extension, however  continues to present with limited increased flexor tone. 03/03/23: Pt. Continues to work on improving consistency with 2nd digit extension to press the remote. 01/22/2023: is able to perform full digit extension, although 2nd digit is slow to extend s 2/2 flexor tone. Pt.  Eval: Pt.  is able to is unable to actively perform full digit extension Goal status: Ongoing  9: Pt. Will cut food with modified independence    Baseline: 09/17/2023: Pt. Is unable to cut the food on her plate, as well as cut food for meal preparation. 08/20/2023: Pt. Is unable to cut the food on her plate, as well as cut food for meal preparation.    Goal Status: Ongoing   ASSESSMENT:   CLINICAL IMPRESSION:  Pt. presents with less fatigue this week. Pt. tolerated Left UE ROM, and UE exercise on the UBE well without reports of increased pain. Pt. required verbal, visual, and tactile cues for proper form when performing [facility] skills using a 2pt. grasp to pick up the cubes from the resistive board, and using isolated 2nd digit extension to press them back into place. Pt. Was more accurately able to place the items onto the resistive board. Pt. tolerated the whole session without fatigue. Pt. continues to work on improving LE UE ROM, strength, FMC, and hand function skills in order to work towards improving, and maximizing independence with ADLs, and IADL tasks.    PERFORMANCE DEFICITS in functional skills including ADLs, IADLs, coordination, proprioception, ROM, strength, FMC, and GMC, cognitive skills including memory, and psychosocial skills including coping strategies, environmental adaptation, interpersonal interactions, and routines and behaviors.    IMPAIRMENTS are limiting patient from ADLs, IADLs, education, leisure, and social participation.    COMORBIDITIES may have co-morbidities  that affects occupational performance. Patient will benefit from skilled OT to address above impairments and improve overall function.    MODIFICATION OR ASSISTANCE TO COMPLETE EVALUATION: Min-Moderate modification of tasks or assist with assess necessary to complete an evaluation.   OT OCCUPATIONAL PROFILE AND HISTORY: Detailed assessment: Review of records and additional review of physical, cognitive, psychosocial history related to current functional performance.   CLINICAL DECISION MAKING: Moderate - several treatment options, min-mod task modification necessary   REHAB POTENTIAL: Good   EVALUATION COMPLEXITY: Moderate      PLAN: OT FREQUENCY: 2x/week   OT DURATION: 12 weeks   PLANNED INTERVENTIONS: self care/ADL training, therapeutic exercise, therapeutic activity, neuromuscular re-education, manual therapy, passive range of motion, functional mobility training, electrical stimulation, and paraffin   RECOMMENDED OTHER SERVICES: PT   CONSULTED AND AGREED WITH PLAN OF CARE: Patient and family member/caregiver   PLAN FOR NEXT SESSION: see above    Olegario Messier, MS, OTR/L  10/01/2023

## 2023-10-01 NOTE — Therapy (Signed)
OUTPATIENT PHYSICAL THERAPY TREATMENT   Patient Name: [name] MRN: [id number] DOB:01-11-1941, 82 y.o., female Today's Date: 10/01/2023   PCP: Aram Beecham, D MD REFERRING PROVIDER: Jacquelynn Cree PA   END OF SESSION:  PT End of Session - 10/01/23 1616     Visit Number 8    Number of Visits 16    Date for PT Re-Evaluation 10/20/23    Authorization Type [employer]:    Authorization Time Period 24 visits approved 04/14/23-09/25/23    Authorization - Number of Visits 24    Progress Note Due on Visit 10    PT Start Time 1617    PT Stop Time 1700    PT Time Calculation (min) 43 min    Equipment Utilized During Treatment Gait belt    Activity Tolerance Patient tolerated treatment well    Behavior During Therapy WFL for tasks assessed/performed                 Past Medical History:  Diagnosis Date   Anemia    Arthritis    Asthma    uses inhaler just prior to surgery to avoid attack   Back pain    from previous injury   Complication of anesthesia    has woken  up during 2 different surgery   Depression    no current issue/treatment; situation   Gallstones    GERD (gastroesophageal reflux disease)    Hiatal hernia    patient does NOT have nerve/muscle disease   History of kidney stones    HLD (hyperlipidemia)    HTN (hypertension)    Hypothyroidism    Kidney stones    Knee pain    Nausea and vomiting 10/15/2022   Non-diabetic pancreatic hormone dysfunction years   pt. 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   Pancreatitis    Pneumonia    Seizures (HCC)    caused by dye injected during a procedure   Shortness of breath    with exertion   Sinus problem    frequent infections/congestion   Stroke (HCC) 2021   reports having CVA in 2021 and having mini strokes before that   Thyroid disease    Past Surgical History:  Procedure Laterality Date   ABDOMINAL HYSTERECTOMY     APPENDECTOMY     CARPAL TUNNEL RELEASE  10+ years ago   bilateral    EYE SURGERY  3 yrs ago   bilateral cataracts   FOOT OSTEOTOMY  6 weeks ago   Left foot: great, 2nd & 3rd   FOOT OSTEOTOMY  5 years ago   Right great toe   HAND SURGERY Bilateral 2011-most recent   multiple hand surgeries, 2 on left, 3 on right   KNEE ARTHROPLASTY Right 04/28/2022   Procedure: COMPUTER ASSISTED TOTAL KNEE ARTHROPLASTY;  Surgeon: Donato Heinz, MD;  Location: [facility];  Service: Orthopedics;  Laterality: Right;   LOOP RECORDER INSERTION N/A 05/16/2020   Procedure: LOOP RECORDER INSERTION;  Surgeon: Marcina Millard, MD;  Location: [facility];  Service: Cardiovascular;  Laterality: N/A;   NASAL SINUS SURGERY  most recent 7-8 yrs ago   7 sinus surgeries    TRIGGER FINGER RELEASE  11/19/2011   Procedure: RELEASE TRIGGER FINGER/A-1 PULLEY;  Surgeon: Nicki Reaper, MD;  Location: [facility];  Service: Orthopedics;  Laterality: Right;  release a-1 pulley right index finger and cyst removal   WRIST GANGLION EXCISION  1980's   right  ONSET DATE: 11/23/22  REFERRING DIAG: TIA  THERAPY DIAG:  Unsteadiness on feet  Muscle weakness (generalized)  Other lack of coordination  Difficulty in walking, not elsewhere classified  Acute left-sided weakness  Rationale for Evaluation and Treatment: Rehabilitation  SUBJECTIVE:                                                                                                                                                                                             SUBJECTIVE STATEMENT:   Pt reports that she is doing well. No pain reported by pt on this day.   Pt accompanied by: significant other  PERTINENT HISTORY: [name] is an 82 year-old female who was admitted to [facility] on 11/23/2022 with a cerebellar CVA. 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 Pt. Was admitted to Inpatient Rehab from 12/21-1/02/2023. 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  Patient underwent inpatient rehabilitation for 2 weeks.  Patient was assessed and was scheduled for a knee replacement on 08/29/2022 however had to cancel it due to having had a CVA. PMH includes: anemia, arthritis, back pain, depression, hernia, GERD, HLD, HTN, hypothyroidism, knee pain, pancreatic hormone dysfunction, pneumonia, seizures, stroke, thyroid disease.  Patient has a donjoy brace for L knee.   PAIN:  Are you having pain? Yes, only with end range flexion and STS.    PRECAUTIONS: Fall, has latex allergy  WEIGHT BEARING RESTRICTIONS: No  FALLS: Has patient fallen in last 6 months? Yes. Number of falls 4 falls   LIVING ENVIRONMENT: Lives with: lives with their family and lives with their spouse Lives in: House/apartment Stairs: Yes: Internal: flight steps;   and External: 2 steps; on right going up and on left going up Has following equipment at home: Single point cane, Walker - 2 wheeled, Shower bench, and bed side commode  PLOF: Independent  PATIENT GOALS: to have less pain and move better  OBJECTIVE:   TODAY'S TREATMENT:  DATE: 10/01/23  Sit<>stand from transport chair 2 x 6 with LUE supported on RW   Gait with RW x 22ft +75 with min assist from PT for safety and cues for AD management   Pt performed stand pivot transfer to nustep with CGA and no AD.   Seated therex with 2# ankle  LAQ x 12 Hip adduction x 15   Ball raise at ankles  x 12  Hip flexion/abduction over hurdle x 12  Ankle PF on bolster x 20  Hip abduction x 12 RTB  HS curl RTB x 12 bil   Nustep reciprocal movement and endurance training x 2.5 min level 2. Seat 4, arms 7. No pain reported by pt again on this day.  Performed at level 1   Throughout session, pt tolerates standing at RW without report of knee pain on the LLE. Min cues for safety  and proper stance width use of UE on RW   Gait with RW and L knee brace x 74ft without knee pain. CGA for safety and min cues for AD management in turns.     PATIENT EDUCATION: Education details: Pt educated throughout session about proper posture and technique with exercises. Improved exercise technique, movement at target joints, use of target muscles after min to mod verbal, visual, tactile cues.   HOME EXERCISE PROGRAM:  Pending visit 2   GOALS: Goals reviewed with patient? Yes  SHORT TERM GOALS: Target date: 09/19/23  Patient will be independent in starter HEP to improve A/ROM tolerance and preserve available ROM of LLE and to advance A/ROM and strength of RLE.  Baseline: 08/20/23: defer to vists 2-3  Goal status: NEW  LONG TERM GOALS: Target date: 10/20/23  Patient will increase FOTO score to equal to or greater than 65%  to demonstrate statistically significant improvement in ease of mobility.  Baseline:  47%; 06/23/23: 60;   7/16: 60%  Goal status: NEW   2.  Patient (> 51 years old) will complete five times sit to stand test in < 15 seconds indicating an increased RLE power.  Baseline: 5/7: unable to tolerate ; 7/16: 17.4sec Goal status: NEW  3.   Patient to report tolerance of ad lib AMB in home during the day of distances >56ft >5x at supervision level or better to improve independence with ADL.    Baseline: 08/20/23: using transport chair for nearly all mobility, not tolerating walking due to WB limitations of LLE  Goal status: NEW    ASSESSMENT:  CLINICAL IMPRESSION:   Pt shows significant progress this session, continues to demonstrate improved tolerance to full ROM and standing. Increased tolerance to gait of longer distance on this day. Able to improve step width with cues from PT to reduce fall risk and reduce valgus stress on the L knee. Increased resistance and repetitions for BLE strengthening within reported increase in pain.  The patient will benefit from  skilled physical therapy to reduce pain, improve mobility, improve strength and improve quality of life.     OBJECTIVE IMPAIRMENTS: Abnormal gait, cardiopulmonary status limiting activity, decreased activity tolerance, decreased balance, decreased cognition, decreased endurance, decreased knowledge of use of DME, decreased mobility, difficulty walking, decreased ROM, decreased strength, decreased safety awareness, hypomobility, increased fascial restrictions, impaired perceived functional ability, impaired flexibility, impaired UE functional use, improper body mechanics, postural dysfunction, and pain.   ACTIVITY LIMITATIONS: carrying, lifting, bending, sitting, standing, squatting, stairs, transfers, bed mobility, bathing, toileting, dressing, reach over head, hygiene/grooming, locomotion level, and caring for others  PARTICIPATION LIMITATIONS: meal prep, cleaning, laundry, medication management, personal finances, interpersonal relationship, driving, shopping, community activity, yard work, school, and church  PERSONAL FACTORS: Age, Behavior pattern, Education, Fitness, Past/current experiences, Time since onset of injury/illness/exacerbation, and 3+ comorbidities: anemia, arthritis, back pain, depression, hernia, GERD, HLD, HTN, hypothyroidism, knee pain, pancreatic hormone dysfunction, pneumonia, seizures, stroke, thyroid disease  are also affecting patient's functional outcome.   REHAB POTENTIAL: Fair    CLINICAL DECISION MAKING: Evolving/moderate complexity  EVALUATION COMPLEXITY: Moderate  PLAN:  PT FREQUENCY: 1-2x/week  PT DURATION: 12 weeks  PLANNED INTERVENTIONS: Therapeutic exercises, Therapeutic activity, Neuromuscular re-education, Balance training, Gait training, Patient/Family education, Self Care, Joint mobilization, Joint manipulation, Stair training, Vestibular training, Canalith repositioning, Visual/preceptual remediation/compensation, Orthotic/Fit training, DME  instructions, Electrical stimulation, Wheelchair mobility training, Spinal mobilization, Cryotherapy, Moist heat, Compression bandaging, scar mobilization, Splintting, Taping, Traction, Ultrasound, Manual therapy, and Re-evaluation  PLAN FOR NEXT SESSION:  Tolerance to standing in functional movement/ tasks.  HEP    Golden Pop, PT, DPT 10/01/2023, 4:18 PM   4:18 PM, 10/01/23  Physical Therapist - [facility]  [phone] [facility]

## 2023-10-05 DIAGNOSIS — M1712 Unilateral primary osteoarthritis, left knee: Secondary | ICD-10-CM | POA: Diagnosis not present

## 2023-10-06 ENCOUNTER — Ambulatory Visit: Payer: Medicare HMO | Admitting: Physical Therapy

## 2023-10-06 ENCOUNTER — Ambulatory Visit: Payer: Medicare HMO | Admitting: Occupational Therapy

## 2023-10-06 DIAGNOSIS — R2681 Unsteadiness on feet: Secondary | ICD-10-CM | POA: Diagnosis not present

## 2023-10-06 DIAGNOSIS — I6381 Other cerebral infarction due to occlusion or stenosis of small artery: Secondary | ICD-10-CM

## 2023-10-06 DIAGNOSIS — R278 Other lack of coordination: Secondary | ICD-10-CM | POA: Diagnosis not present

## 2023-10-06 DIAGNOSIS — R531 Weakness: Secondary | ICD-10-CM

## 2023-10-06 DIAGNOSIS — R262 Difficulty in walking, not elsewhere classified: Secondary | ICD-10-CM | POA: Diagnosis not present

## 2023-10-06 DIAGNOSIS — M6281 Muscle weakness (generalized): Secondary | ICD-10-CM

## 2023-10-06 NOTE — Therapy (Addendum)
Occupational Therapy Neuro Treatment Note    Patient Name: [name] MRN: [id number] DOB:December 10, 1940, 82 y.o., female Today's Date: 10/06/2023  PCP: Dr. Judithann Sheen REFERRING PROVIDER:  Dr. Judithann Sheen  END OF SESSION:  PLAN:   OT End of Session - 10/06/23 1733     Visit Number [id number]    Number of Visits 96    Date for OT Re-Evaluation 11/12/23    Authorization Time Period Progress report period starting 12/16/2022    OT Start Time 1530    OT Stop Time 1615    OT Time Calculation (min) 45 min    Activity Tolerance Patient tolerated treatment well    Behavior During Therapy WFL for tasks assessed/performed                   Past Medical History:  Diagnosis Date   Anemia    Arthritis    Asthma    uses inhaler just prior to surgery to avoid attack   Back pain    from previous injury   Complication of anesthesia    has woken  up during 2 different surgery   Depression    no current issue/treatment; situation   Gallstones    GERD (gastroesophageal reflux disease)    Hiatal hernia    patient does NOT have nerve/muscle disease   History of kidney stones    HLD (hyperlipidemia)    HTN (hypertension)    Hypothyroidism    Kidney stones    Knee pain    Nausea and vomiting 10/15/2022   Non-diabetic pancreatic hormone dysfunction years   pt. 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   Pancreatitis    Pneumonia    Seizures (HCC)    caused by dye injected during a procedure   Shortness of breath    with exertion   Sinus problem    frequent infections/congestion   Stroke (HCC) 2021   reports having CVA in 2021 and having mini strokes before that   Thyroid disease    Past Surgical History:  Procedure Laterality Date   ABDOMINAL HYSTERECTOMY     APPENDECTOMY     CARPAL TUNNEL RELEASE  10+ years ago   bilateral   EYE SURGERY  3 yrs ago   bilateral cataracts   FOOT OSTEOTOMY  6 weeks ago   Left foot: great, 2nd & 3rd   FOOT OSTEOTOMY  5 years ago   Right great toe    HAND SURGERY Bilateral 2011-most recent   multiple hand surgeries, 2 on left, 3 on right   KNEE ARTHROPLASTY Right 04/28/2022   Procedure: COMPUTER ASSISTED TOTAL KNEE ARTHROPLASTY;  Surgeon: Donato Heinz, MD;  Location: [facility];  Service: Orthopedics;  Laterality: Right;   LOOP RECORDER INSERTION N/A 05/16/2020   Procedure: LOOP RECORDER INSERTION;  Surgeon: Marcina Millard, MD;  Location: [facility];  Service: Cardiovascular;  Laterality: N/A;   NASAL SINUS SURGERY  most recent 7-8 yrs ago   7 sinus surgeries    TRIGGER FINGER RELEASE  11/19/2011   Procedure: RELEASE TRIGGER FINGER/A-1 PULLEY;  Surgeon: Nicki Reaper, MD;  Location: [facility];  Service: Orthopedics;  Laterality: Right;  release a-1 pulley right index finger and cyst removal   WRIST GANGLION EXCISION  1980's   right   Patient Active Problem List   Diagnosis Date Noted   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 11/27/2022   Hypomagnesemia 11/27/2022   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  HLD (hyperlipidemia) 11/23/2022   Asthma 11/23/2022   Depression with anxiety 11/23/2022   Chronic diastolic CHF (congestive heart failure) (HCC) 11/23/2022   Normocytic anemia 11/23/2022   Aspiration pneumonia (HCC) 11/23/2022   AKI (acute kidney injury) (HCC) 11/23/2022   Abdominal pain 11/23/2022   Mesenteric mass 11/23/2022   Coffee ground emesis 11/23/2022   Nausea and vomiting 10/15/2022   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   Fatigue 09/17/2022   Left hemiparesis (HCC) 09/17/2022   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HCC) 08/22/2022   GERD (gastroesophageal reflux disease) 08/21/2022   Agitation 08/20/2022   Acute left-sided weakness 08/20/2022   Expressive aphasia    Stroke (HCC) 08/19/2022   Leukocytosis 08/19/2022   History of urticaria 04/28/2022   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   Primary osteoarthritis of left knee 02/24/2022   Primary osteoarthritis of right  knee 02/24/2022   Lumbar spondylolysis 04/12/2020   History of CVA (cerebrovascular accident) 03/26/2020   Low back pain radiating to right lower extremity 03/21/2020   B12 deficiency 03/06/2020   Positive anti-CCP test 12/21/2019   Arthralgia 12/13/2019   Dermatitis 12/13/2019   Rheumatoid factor positive 12/13/2019   Essential hypertension 12/11/2018   Palpitations 12/11/2018   Acquired hypothyroidism 11/10/2018   Arthritis of knee 09/17/2016   Anxiety 11/22/2014   Asthma without status asthmaticus 11/22/2014   Benign neoplasm of colon, unspecified 11/22/2014   Environmental allergies 11/22/2014   Hypertriglyceridemia 11/22/2014   Hypokalemia 11/22/2014   Personal history of disease of skin and subcutaneous tissue 11/22/2014   REFERRING DIAG: CVA   THERAPY DIAG:  Muscle weakness (generalized)   Other lack of coordination   Rationale for Evaluation and Treatment Rehabilitation   SUBJECTIVE:   SUBJECTIVE STATEMENT:  Pt. Reports that she is thinking about getting a dog.  Pt accompanied by: significant other   PERTINENT HISTORY: Patient is an 82 year-old female who was admitted to [facility] on 11/23/2022 with a cerebellar CVA. 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 Pt. Was admitted to Inpatient Rehab from 12/21-1/02/2023. 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  Patient underwent inpatient rehabilitation for 2 weeks.  Patient was assessed and was scheduled for a knee replacement on 08/29/2022 however had to cancel it due to having had a CVA.  Patient had a recent fall 2 days after discharging from inpatient rehab. Past Medical History includes: Knee replacement, essential HTN, hypokalemia, leukocytosis, seizures, positive anti-- CCP test, anxiety disorder, mini strokes.  Patient had shingles with left eye nerve pain s/p 1 year ago.    PRECAUTIONS: Fall   WEIGHT BEARING RESTRICTIONS No    PAIN:  Are you having pain?  5/10 with left shoulder abduction   FALLS: Has patient fallen in last 6 months? Yes. Number of falls 1   LIVING ENVIRONMENT: Lives with: Lives with Spouse Lives in: House/apartment Stairs: 2 storey home, resides on the first floor.  External: 2 stairs front no rails, and 6 in back with rails Has following equipment at home: Single point cane, Walker - 2 wheeled, Environmental consultant - 4 wheeled, Shower bench, and bed side commode   PLOF: Independent   PATIENT GOALS  To Regain the use of her left arm   OBJECTIVE:    HAND DOMINANCE: Right   ADLs: Overall ADLs: Husband assists pt. as needed Transfers/ambulation related to ADLs:Pt. Uses a 3 wheeled walker with Husband assist. Eating: Pt. Is independent with the right hand. Pt. has difficulty cutting food. Grooming: Pt.  is using her right hand, however has difficulty sustaining her LUE in elevation to assist with haircare. UB Dressing: Pt. Is independent donning a pullover shirt, and button down shirt. Has difficulty with buttoning, LB Dressing:  Independent donning pants, and socks. Difficulty tying shoes. Toileting: Independent Bathing: Pt. Is able to engage her right hand. Tub Shower transfers: Supervision Equipment: See above for equipment    IADLs: Shopping:  Has not had the opportunity for grocery shopping yet Light housekeeping: Husband is assisting with light house keeping Meal Prep:  Dependent Community mobility: Relies of family/friends Medication management: Husband assisting with weekly pillbox set-up, and administering medication. Financial management: TBD Handwriting: 75% legible   MOBILITY STATUS: Hx of falls   POSTURE COMMENTS:  No Significant postural limitations Sitting balance: supported sitting balance WFL   ACTIVITY TOLERANCE: Activity tolerance:  Fatigues in greater than 30 min.    FUNCTIONAL OUTCOME MEASURES: FOTO: 57   UPPER EXTREMITY ROM      Active ROM Right Eval: WFL  Left eval Left  01/22/23 Left  03/03/23 Left  04/14/23 Left  05/28/2023 Left 07/23/2023 Left 08/20/2023 Left 09/17/23  Shoulder flexion   132 100 108 108 108 60(70) 70(78) 70(102)  Shoulder abduction   80 85 85 85 85 65(78) 72(85) 73(94)  Shoulder adduction             Shoulder extension             Shoulder internal rotation             Shoulder external rotation             Elbow flexion   140 140 WFL [facility] WFL  Elbow extension   WNL WNL [facility] WFL WFL  Wrist flexion   65 68        Wrist extension   -10 20 24  32 32 -10 20 20   Wrist ulnar deviation     12 10 14 14 10 18 18   Wrist radial deviation     8 14 14 16 12 14 14   Wrist pronation             Wrist supination             (Blank rows = not tested)   Left digit flexion to [facility]: 2nd: 0cm, 3rd: 0cm, 4th: 0cm, 5th: 0cm   Limited Left full 2nd digit extension     UPPER EXTREMITY MMT:      MMT Right Eval: 4+/5 overall Right 07/23/2023 Left Eval Left 01/22/23 Left 03/03/23 Left  04/14/2023 Left 05/28/2023 Left 07/23/2023 Left 08/20/2023 Left 09/17/23  Shoulder flexion   3+/5 3/5 3-/5 3-/5 3-/5 3-/5 2/5 2+/5 2+/5  Shoulder abduction   3+/5 3-/5 3-/5 3-/5 3-/5 3-/5 2/5 2+/5 2+/5  Shoulder adduction              Shoulder extension              Shoulder internal rotation              Shoulder external rotation              Middle trapezius              Lower trapezius              Elbow flexion   4/5 3+/5 4/5 N/A 4/5 4/5 3+/5 3+/5 3+/5  Elbow extension   4/5 3+/5 4/4 N/A 4/5 4/5 3+/5 3+/5  4-/5  Wrist flexion              Wrist extension   4/5 2-/5 3-/5 3-/5 3/5 3+/5 3-/5 3+/5 3+/5  Wrist ulnar deviation              Wrist radial deviation              Wrist pronation              Wrist supination              (Blank rows = not tested)   HAND FUNCTION: Grip strength: Right: 26#, Left: 10# Pinch strength: Right 8#, Left: 3#, 3 Pt. Pinch strength: Right: 9#, L: 2#  01/22/2023 Grip strength: Right: 26#,  Left: 12# Pinch strength:  Pinch meter used at the initial eval has been sent out for recalibration   03/03/2023 Grip strength: Right: 26#, Left: 13# Pinch strength:  Pinch meter used at the initial eval has been sent out for recalibration  04/14/2023: Grip strength: Right: 26#, Left: 13# Pinch strength:   Right 8#, Left: 4#, 3 Pt. Pinch strength: Right: 9#, L: 3#    05/28/2023: Grip strength: Right: 26#, Left: 13# Pinch strength:   Right 8#, Left: 2#, 3 Pt. Pinch strength: Right: 9#, L: 4#  07/23/2023  Grip strength: Right: 5#, Left: 2# Pinch strength:   Right 6#, Left: 2#, 3 Pt. Pinch strength: Right: 5#, L: 2#  08/20/2023:    Grip strength: Right: 8#, Left: 4# Pinch strength:   Right 6#, Left: 2#, 3 Pt. Pinch strength: Right: 5#, L: 2#    09/17/23:  Grip strength: Right: 5#, Left: 4# Pinch strength:   Right 6#, Left: 2#, 3 Pt. Pinch strength: Right: 5#, L: 2#   COORDINATION: Right: 22 sec., Left: <5 min. To place 7 pegs with increased compensation proximally in the trunk, and through reflexive associated reactions.  01/22/23 Right: 22 sec., Left: 3 min. & 4 sec.  03/03/23 Right: 22 sec., Left: 1 min. & 39 sec.  04/14/23   TBD  05/28/23 Right: 22 sec., Left:  5 min.  07/23/2023  Right: 33 sec., Left:  Pt. Is unable to grasp, and place pegs into the pegboard. Pt. Was able to remove 9 vertical pegs in 34 sec.  08/20/2023:  Right: 27 sec., Left:  5 pegs placed in 5 min.; Pt. Was able to remove 9 vertical pegs in 28 sec.  09/17/23:  Right: 26 sec., Left:  3 min, & 49 sec. pegs placed in 5 min.; Pt. Was able to remove 9 vertical pegs in 24 sec.         SENSATION: Light touch: WFL, proprioceptive awareness: Intact   EDEMA: N/A   MUSCLE TONE: LUE: Hypotonic   COGNITION: Overall cognitive status: WFL for tasks assessed. Pt. Is impulsive at times.   VISION: Subjective report: Pt. report having shingles affecting left eye  s/p 1 year. Has nerve pain Baseline  vision: Wears glasses for reading only Visual history:  updated see clinical impression   VISION ASSESSMENT:    WFL for tasks performed   PERCEPTION: Intact   PRAXIS: Impaired: Motor planning   OBSERVATIONS:  Pt. more alert, and engaging since prior to the most recent hospitalization.   TODAY'S TREATMENT:    There. Ex.:   Pt. tolerated PROM/AAROM to the Left shoulder, elbow, wrist, and digits. Pt. worked on [employer], and reciprocal motion using the UBE while seated for 8 min. with no resistance. Constant monitoring  was provided.   Neuromuscular re-education:  Pt. worked left hand [facility] skills grasping 1" circular pegs, and placing them onto a pegboard. Pt. worked on formulating a 3pt. grasp pattern. Pt. worked on isolating 2nd digit extension to count, and skip spaces on the pegboard.         PATIENT EDUCATION: Education details: LUE therapeutic exercise Person educated: Patient and Spouse Education method: Medical illustrator Education comprehension: verbalized understanding, returned demonstration, and needs further education   HOME EXERCISE PROGRAM:   Reviewed activities at home to promote isolated 2nd digit extension.    GOALS: Goals reviewed with patient? Yes   SHORT TERM GOALS: Target date: 10/01/2023       1. Patient will be independent with home exercise program for the left upper extremity Baseline: 09/17/23: Independent 08/20/23: Pt. continues to require assist from he husband for HEPs 07/23/2023: Pt. continues to require assist. 05/28/2023: Pt. Requires assist 04/09/2023: Pt. Continues to consistently attempt to engage her hand at hand 02/01/2023: Pt. Consistently attempts to perform HEPs independently. No current home exercise program Goal status: Ongoing   LONG TERM GOALS: Target date:  08/20/2023   1.  Patient will improve left shoulder strength by 2 mm grades to be able to sustain UE's in elevation long enough to wash her hair.  Baseline:  09/17/2023: Left shoulder 2+/5, abduction 2+/5 08/20/2023: Left shoulder flexion 2+/5, abduction 2+/5  07/23/2023: Left shoulder flexion: 2/5, abduction: 2/5. 05/28/2023: Left shoulder flexion: 3-/5, abduction: 3-/5. Pt. has an old left shoulder injury limiting progression with left shoulder strength 05/26/2023: Pt. continues to present with limitiations in sustaining LUE elevation long enough to perform hair care thoroughly 04/09/2023: Pt. is limited with sustaining LUE elevation long enough to perform hair care thoroughly. 03/03/2023: Left shoulder flexion: 3-/5, abduction: 3-/5 01/22/2023: Left shoulder flexion: 3-/5, abduction: 3-/5 Eval: Left shoulder flexion: 3/5, abduction: 3-/5 Goal status: Ongoing   2.  Patient will improve left shoulder active abduction to be able to comb her hair Baseline: 09/17/2023: 73(94) Pt. is able to reach her left side, and back of hair. 9/11/09/2023: 72(85) Pt. is able to reach her left side, and back of hair. 07/23/2023: Left shoulder flexion: 60(70) abduction: 65(78) 05/28/2023: left shoulder flexion: 108 abduction: 85 Pt. Has an old left shoulder injury limiting progression with left shoulder ROM 05/26/2023:  5/10 left shoulder pain with ROM limits using it functionally during hair care. 04/09/2023: Pt. Presents with difficulty abducting her left shoulder enough to thoroughly complete haircare. 03/03/2023: Shoulder abduction: 85 01/22/2023: Shoulder abduction: 85 Eval: Left shoulder abduction is 80(108) Goal status: Improving, Ongoing   3.  Patient will independently button her shirt with modified independence. Baseline:09/17/2023: Pt. Has a buttonhook, however does not know where it is. Pt. requires increased time to complete, and assist from her husband. 08/20/2023: Pt. requires increased time to complete, and assist from her husband. 07/23/2023 Pt. presents with difficulty buttoning her shirt. 05/28/2023: pt. Continues to work towards progressing with buttoning 05/26/2023:  Pt.  Continues to progress towards buttoning. 04/09/2023: Pt. Continues to progress towards buttoning. 03/03/2023: Pt. Continues to have difficulty with buttoning. 01/22/2023: Pt. continues to have difficulty. Eval: Patient has difficulty.  Goal status: Ongoing   4.  Patient well improve left grip strength in preparation for securely hold a glass/beverage Baseline: 09/17/2023: Grip strength: Right: 5#, Left: 4# Pt. Has difficulty holding a glass. Right: 8#, Left: 4# Pt. Is able to more securely hold mirrors, and flowers with the left hand, however has  difficulty holding a glass beverage, or bottle. 08/20/2023: Right: 8#, Left: 4# Pt. Is able to more securely hold mirrors, and flowers with the left hand, however has difficulty holding a glass beverage, or bottle. 07/23/2023: Grip strength: Right: 5#, Left: 2# 05/28/2023: Right: 26#, Left: 13#  pt. Presents with difficulty securely holding flowers in her left hand. 05/26/2023: TBD 04/09/2023: Pt. Is able to hold, and hike pants with the left hand, continues to have difficulty with securely holding flowers.   03/03/2023: left grip strength: 13# 01/22/2023: Left: 12# Eval: Pt. Is unable to securely hold flowers. Goal status: Ongoing   5.  Pt. will independently recall adaptive  strategies for performing ADL/ADL kitchen tasks.  Baseline: 08/20/2023: Revised to include IADL kitchen tasks.  07/23/2023: Continue 05/28/2023: Pt. Needs continued education about adaptive strategies.05/26/2023: Continue 04/09/2023: Continue3/26/2024: Continue 01/22/2023: Pt. continues to benefit from education about adaptive strategies during ADLs, and IADLs. Eval: Pt. to be provided with adaptive strategies. Goal status: Revised   6.  Pt. will improve FOTO score by 2 points to reflect patient perceived performance improvement assessment specific ADLs  and IADLs Baseline: 40/98/1191: FOTO score: 61 07/23/2023: FOTO score: 57 05/28/2023: FOTO score 59 05/26/2023: TBD 5/02/204:  TBD 03/03/23: FOTO 62 Eval:  57 Goal status: Ongoing  7.  Pt. will improve left hand coordination skills in order to be able to  manipulate small objects.     Baseline: 09/17/2023: Right: 26 sec., Left:  3 min, & 49 sec. pegs placed in 5 min.; Pt. Was able to remove 9 vertical pegs in 24 sec. 08/20/2023: Right: 27 sec., Left:  5 pegs placed in 5 min.; Pt. Was able to remove 9 vertical pegs in 28 sec. Pt. Is able to hold, and sort utensils in a drawer. Pt. Has difficulty manipulating small objects. 07/23/2023: Right: 33 sec., Left:  Pt. Is unable to grasp, and place pegs into the pegboard. Pt. Was able to remove 9 vertical pegs in 34 sec. 05/28/2023: Left:  5 min. 05/26/2023 Pt. Continues to present with difficulty handling and sorting utensils. 04/14/2023: 56 04/09/2023: TBD 03/03/23: 1 min. & 39 01/22/2022: Left: 3 min. & 4 sec. Eval: Pt. has difficulty sorting, and placing utensils with the left hand. Left FMC : >5 min. For 7 pegs on the 9 hole peg test.    Goal Status: Improving, Revised to be able to improve left hand FMC to be able to manipulate  small objects   8. Pt. will improve active left 2nd digit extension to be able able to isolate her 2nd digit in preparation for pressing/pushing buttons on appliances, phones, or remotes. Baseline: 09/17/2023: Pt. Presents with limited isolated 2nd digit extension with increased time. 08/20/2023: Pt. Is more consistently able to isolate 2nd digit extension with increased time. 07/23/2023: Pt. Continues to work on consistency with isolating left 2nd digit extension 05/28/2023: Pt. Continues to improve with left 2nd digit extension 05/26/2023: Improving with left 2nd digit extension. 04/09/2023: Pt. Is progressing with isolating left 2nd digit extension, however continues to present with limited increased flexor tone. 03/03/23: Pt. Continues to work on improving consistency with 2nd digit extension to press the remote. 01/22/2023: is able to perform full digit extension, although 2nd digit is slow to  extend s 2/2 flexor tone. Pt.  Eval: Pt. is able to is unable to actively perform full digit extension Goal status: Ongoing  9: Pt. Will cut food with modified independence    Baseline: 09/17/2023: Pt. Is unable to cut the  food on her plate, as well as cut food for meal preparation. 08/20/2023: Pt. Is unable to cut the food on her plate, as well as cut food for meal preparation.    Goal Status: Ongoing   ASSESSMENT:   CLINICAL IMPRESSION:  Pt. reports 5/10 pain with left shoulder abduction. No pain with shoulder flexion, or any other shoulder movements. P Pt. is more alert, with increased engagement as pt. reports feeling much better overall.  Pt. requires intervals of proprioception through the left UE, and hand during the [facility] task 2/2 tone in the left 2nd digit. Pt. presents with limited [facility] skills when attempting to perform [facility] tasks at a vertical angle. Pt. continues to work on improving LUE ROM, strength, FMC, and hand function skills in order to work towards improving, and maximizing independence with ADLs, and IADL tasks.   PERFORMANCE DEFICITS in functional skills including ADLs, IADLs, coordination, proprioception, ROM, strength, FMC, and GMC, cognitive skills including memory, and psychosocial skills including coping strategies, environmental adaptation, interpersonal interactions, and routines and behaviors.    IMPAIRMENTS are limiting patient from ADLs, IADLs, education, leisure, and social participation.    COMORBIDITIES may have co-morbidities  that affects occupational performance. Patient will benefit from skilled OT to address above impairments and improve overall function.   MODIFICATION OR ASSISTANCE TO COMPLETE EVALUATION: Min-Moderate modification of tasks or assist with assess necessary to complete an evaluation.   OT OCCUPATIONAL PROFILE AND HISTORY: Detailed assessment: Review of records and additional review of physical, cognitive, psychosocial history related to current  functional performance.   CLINICAL DECISION MAKING: Moderate - several treatment options, min-mod task modification necessary   REHAB POTENTIAL: Good   EVALUATION COMPLEXITY: Moderate      PLAN: OT FREQUENCY: 2x/week   OT DURATION: 12 weeks   PLANNED INTERVENTIONS: self care/ADL training, therapeutic exercise, therapeutic activity, neuromuscular re-education, manual therapy, passive range of motion, functional mobility training, electrical stimulation, and paraffin   RECOMMENDED OTHER SERVICES: PT   CONSULTED AND AGREED WITH PLAN OF CARE: Patient and family member/caregiver   PLAN FOR NEXT SESSION: see above    Olegario Messier, MS, OTR/L  10/01/2023

## 2023-10-06 NOTE — Therapy (Unsigned)
OUTPATIENT PHYSICAL THERAPY TREATMENT   Patient Name: [name] MRN: [id number] DOB:1941/06/08, 82 y.o., female Today's Date: 10/06/2023   PCP: Aram Beecham, D MD REFERRING PROVIDER: Jacquelynn Cree PA   END OF SESSION:  PT End of Session - 10/06/23 1633     Number of Visits 16    Date for PT Re-Evaluation 10/20/23    Authorization Type [employer]:    Authorization Time Period 24 visits approved 04/14/23-09/25/23    Authorization - Number of Visits 24    Progress Note Due on Visit 10    PT Start Time 1617    PT Stop Time 1700    PT Time Calculation (min) 43 min    Equipment Utilized During Treatment Gait belt    Activity Tolerance Patient tolerated treatment well    Behavior During Therapy WFL for tasks assessed/performed                 Past Medical History:  Diagnosis Date   Anemia    Arthritis    Asthma    uses inhaler just prior to surgery to avoid attack   Back pain    from previous injury   Complication of anesthesia    has woken  up during 2 different surgery   Depression    no current issue/treatment; situation   Gallstones    GERD (gastroesophageal reflux disease)    Hiatal hernia    patient does NOT have nerve/muscle disease   History of kidney stones    HLD (hyperlipidemia)    HTN (hypertension)    Hypothyroidism    Kidney stones    Knee pain    Nausea and vomiting 10/15/2022   Non-diabetic pancreatic hormone dysfunction years   pt. 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   Pancreatitis    Pneumonia    Seizures (HCC)    caused by dye injected during a procedure   Shortness of breath    with exertion   Sinus problem    frequent infections/congestion   Stroke (HCC) 2021   reports having CVA in 2021 and having mini strokes before that   Thyroid disease    Past Surgical History:  Procedure Laterality Date   ABDOMINAL HYSTERECTOMY     APPENDECTOMY     CARPAL TUNNEL RELEASE  10+ years ago   bilateral   EYE SURGERY  3 yrs  ago   bilateral cataracts   FOOT OSTEOTOMY  6 weeks ago   Left foot: great, 2nd & 3rd   FOOT OSTEOTOMY  5 years ago   Right great toe   HAND SURGERY Bilateral 2011-most recent   multiple hand surgeries, 2 on left, 3 on right   KNEE ARTHROPLASTY Right 04/28/2022   Procedure: COMPUTER ASSISTED TOTAL KNEE ARTHROPLASTY;  Surgeon: Donato Heinz, MD;  Location: [facility];  Service: Orthopedics;  Laterality: Right;   LOOP RECORDER INSERTION N/A 05/16/2020   Procedure: LOOP RECORDER INSERTION;  Surgeon: Marcina Millard, MD;  Location: [facility];  Service: Cardiovascular;  Laterality: N/A;   NASAL SINUS SURGERY  most recent 7-8 yrs ago   7 sinus surgeries    TRIGGER FINGER RELEASE  11/19/2011   Procedure: RELEASE TRIGGER FINGER/A-1 PULLEY;  Surgeon: Nicki Reaper, MD;  Location: [facility];  Service: Orthopedics;  Laterality: Right;  release a-1 pulley right index finger and cyst removal   WRIST GANGLION EXCISION  1980's   right     ONSET DATE: 11/23/22  REFERRING  DIAG: TIA  THERAPY DIAG:  Unsteadiness on feet  Muscle weakness (generalized)  Other lack of coordination  Difficulty in walking, not elsewhere classified  Acute left-sided weakness  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HCC)  Rationale for Evaluation and Treatment: Rehabilitation  SUBJECTIVE:                                                                                                                                                                                             SUBJECTIVE STATEMENT:   Pt reports that she is doing well. No pain reported by pt on this day. States that she is not getting dizzy or nauseous for the last few weeks while in standing   Pt accompanied by: significant other  PERTINENT HISTORY: [name] is an 72 year-old female who was admitted to [facility] on 11/23/2022 with a cerebellar CVA. 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 Pt. Was  admitted to Inpatient Rehab from 12/21-1/02/2023. 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  Patient underwent inpatient rehabilitation for 2 weeks.  Patient was assessed and was scheduled for a knee replacement on 08/29/2022 however had to cancel it due to having had a CVA. PMH includes: anemia, arthritis, back pain, depression, hernia, GERD, HLD, HTN, hypothyroidism, knee pain, pancreatic hormone dysfunction, pneumonia, seizures, stroke, thyroid disease.  Patient has a donjoy brace for L knee.   PAIN:  Are you having pain? Yes, only with end range flexion and STS.    PRECAUTIONS: Fall, has latex allergy  WEIGHT BEARING RESTRICTIONS: No  FALLS: Has patient fallen in last 6 months? Yes. Number of falls 4 falls   LIVING ENVIRONMENT: Lives with: lives with their family and lives with their spouse Lives in: House/apartment Stairs: Yes: Internal: flight steps;   and External: 2 steps; on right going up and on left going up Has following equipment at home: Single point cane, Walker - 2 wheeled, Shower bench, and bed side commode  PLOF: Independent  PATIENT GOALS: to have less pain and move better  OBJECTIVE:   TODAY'S TREATMENT:  DATE: 10/06/23 Pt performed stand pivot transfer to and from  nustep with CGA and no AD.  Nustep reciprocal movement and endurance training x 7 min level 1-2. Seat 4, arms 7. No pain reported by pt again on this day.    Sit<>stand from transport chair 2 x 6 with LUE supported on RW   Gait with RW x 16ft +75 with min assist from PT for safety and cues for AD management   Supine therex:  SAQ x 12  SLR X 8 bil pain the L hip.  Hip adduction to squeeze ball x 12  Bridge x 8 bil   Standing therex with RW  Reciprocal march x 8  Hip abduction x 8  Hip extension x 8   Throughout session, pt  tolerates standing at RW without report of knee pain on the LLE. Min cues for safety and proper stance width use of UE on RW   CGA for safety for standing therex. No pain  throughout session    PATIENT EDUCATION: Education details: Pt educated throughout session about proper posture and technique with exercises. Improved exercise technique, movement at target joints, use of target muscles after min to mod verbal, visual, tactile cues.   HOME EXERCISE PROGRAM:  Access Code: [email or web address] URL: [email or web address] Date: 10/07/2023 Prepared by: Grier Rocher  Exercises - Standing March with Counter Support  - 1 x daily - 3 x weekly - 2 sets - 10 reps - Standing Hip Abduction with Unilateral Counter Support  - 1 x daily - 3 x weekly - 2 sets - 10 reps - 2 hold - Standing Hip Extension with Unilateral Counter Support  - 1 x daily - 3 x weekly - 2 sets - 10 reps - 2 hold - Supine Straight Leg Raises  - 1 x daily - 37 x weekly - 2 sets - 10 reps - Bridge  - 1 x daily - 3 x weekly - 2 sets - 10 reps - 2 hold - Supine Heel Slide  - 1 x daily - 3 x weekly - 2 sets - 10 reps  GOALS: Goals reviewed with patient? Yes  SHORT TERM GOALS: Target date: 09/19/23  Patient will be independent in starter HEP to improve A/ROM tolerance and preserve available ROM of LLE and to advance A/ROM and strength of RLE.  Baseline: 08/20/23: defer to vists 2-3  Goal status: NEW  LONG TERM GOALS: Target date: 10/20/23  Patient will increase FOTO score to equal to or greater than 65%  to demonstrate statistically significant improvement in ease of mobility.  Baseline:  47%; 06/23/23: 60;   7/16: 60%  Goal status: NEW   2.  Patient (> 55 years old) will complete five times sit to stand test in < 15 seconds indicating an increased RLE power.  Baseline: 5/7: unable to tolerate ; 7/16: 17.4sec Goal status: NEW  3.   Patient to report tolerance of ad lib AMB in home during the day of distances >53ft >5x  at supervision level or better to improve independence with ADL.    Baseline: 08/20/23: using transport chair for nearly all mobility, not tolerating walking due to WB limitations of LLE  Goal status: NEW    ASSESSMENT:  CLINICAL IMPRESSION:   Pt shows significant progress this session, continues to demonstrate improved tolerance to full ROM and standing. PT provided pt with bed level and standing therex as listed above with hand out provided. Mild crepitus noted in bil knees, but no pain  throughout session except mild hip tightness with SLR on the RLE. The patient will benefit from skilled physical therapy to reduce pain, improve mobility, improve strength and improve quality of life.     OBJECTIVE IMPAIRMENTS: Abnormal gait, cardiopulmonary status limiting activity, decreased activity tolerance, decreased balance, decreased cognition, decreased endurance, decreased knowledge of use of DME, decreased mobility, difficulty walking, decreased ROM, decreased strength, decreased safety awareness, hypomobility, increased fascial restrictions, impaired perceived functional ability, impaired flexibility, impaired UE functional use, improper body mechanics, postural dysfunction, and pain.   ACTIVITY LIMITATIONS: carrying, lifting, bending, sitting, standing, squatting, stairs, transfers, bed mobility, bathing, toileting, dressing, reach over head, hygiene/grooming, locomotion level, and caring for others  PARTICIPATION LIMITATIONS: meal prep, cleaning, laundry, medication management, personal finances, interpersonal relationship, driving, shopping, community activity, yard work, school, and church  PERSONAL FACTORS: Age, Behavior pattern, Education, Fitness, Past/current experiences, Time since onset of injury/illness/exacerbation, and 3+ comorbidities: anemia, arthritis, back pain, depression, hernia, GERD, HLD, HTN, hypothyroidism, knee pain, pancreatic hormone dysfunction, pneumonia, seizures, stroke,  thyroid disease  are also affecting patient's functional outcome.   REHAB POTENTIAL: Fair    CLINICAL DECISION MAKING: Evolving/moderate complexity  EVALUATION COMPLEXITY: Moderate  PLAN:  PT FREQUENCY: 1-2x/week  PT DURATION: 12 weeks  PLANNED INTERVENTIONS: Therapeutic exercises, Therapeutic activity, Neuromuscular re-education, Balance training, Gait training, Patient/Family education, Self Care, Joint mobilization, Joint manipulation, Stair training, Vestibular training, Canalith repositioning, Visual/preceptual remediation/compensation, Orthotic/Fit training, DME instructions, Electrical stimulation, Wheelchair mobility training, Spinal mobilization, Cryotherapy, Moist heat, Compression bandaging, scar mobilization, Splintting, Taping, Traction, Ultrasound, Manual therapy, and Re-evaluation  PLAN FOR NEXT SESSION:  Tolerance to standing in functional movement/ tasks.  Continue BLE strengthening in weighted and unweighted position   Golden Pop, PT, DPT 10/06/2023, 4:36 PM   4:36 PM, 10/06/23  Physical Therapist - [facility]  [phone] [facility])

## 2023-10-08 ENCOUNTER — Ambulatory Visit: Payer: Medicare HMO | Admitting: Occupational Therapy

## 2023-10-08 ENCOUNTER — Ambulatory Visit: Payer: Medicare HMO | Admitting: Physical Therapy

## 2023-10-08 DIAGNOSIS — R278 Other lack of coordination: Secondary | ICD-10-CM | POA: Diagnosis not present

## 2023-10-08 DIAGNOSIS — R531 Weakness: Secondary | ICD-10-CM

## 2023-10-08 DIAGNOSIS — I6381 Other cerebral infarction due to occlusion or stenosis of small artery: Secondary | ICD-10-CM

## 2023-10-08 DIAGNOSIS — M6281 Muscle weakness (generalized): Secondary | ICD-10-CM | POA: Diagnosis not present

## 2023-10-08 DIAGNOSIS — R262 Difficulty in walking, not elsewhere classified: Secondary | ICD-10-CM

## 2023-10-08 DIAGNOSIS — R2681 Unsteadiness on feet: Secondary | ICD-10-CM | POA: Diagnosis not present

## 2023-10-08 NOTE — Therapy (Signed)
OUTPATIENT PHYSICAL THERAPY TREATMENT/  PHYSICAL THERAPY PROGRESS NOTE   Dates of reporting period  08/20/2023   to   10/08/2023    Patient Name: [name] MRN: [id number] DOB:1941/11/26, 82 y.o., female Today's Date: 10/08/2023   PCP: Aram Beecham, D MD REFERRING PROVIDER: Jacquelynn Cree PA   END OF SESSION:  PT End of Session - 10/08/23 1603     Visit Number 10    Number of Visits 16    Date for PT Re-Evaluation 10/20/23    Authorization Type [employer] Medicare:    Authorization Time Period --    Authorization - Number of Visits 24    Progress Note Due on Visit 10    PT Start Time 1615    PT Stop Time 1645    PT Time Calculation (min) 30 min    Equipment Utilized During Treatment Gait belt    Activity Tolerance Patient tolerated treatment well    Behavior During Therapy WFL for tasks assessed/performed                 Past Medical History:  Diagnosis Date   Anemia    Arthritis    Asthma    uses inhaler just prior to surgery to avoid attack   Back pain    from previous injury   Complication of anesthesia    has woken  up during 2 different surgery   Depression    no current issue/treatment; situation   Gallstones    GERD (gastroesophageal reflux disease)    Hiatal hernia    patient does NOT have nerve/muscle disease   History of kidney stones    HLD (hyperlipidemia)    HTN (hypertension)    Hypothyroidism    Kidney stones    Knee pain    Nausea and vomiting 10/15/2022   Non-diabetic pancreatic hormone dysfunction years   pt. 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   Pancreatitis    Pneumonia    Seizures (HCC)    caused by dye injected during a procedure   Shortness of breath    with exertion   Sinus problem    frequent infections/congestion   Stroke (HCC) 2021   reports having CVA in 2021 and having mini strokes before that   Thyroid disease    Past Surgical History:  Procedure Laterality Date   ABDOMINAL HYSTERECTOMY      APPENDECTOMY     CARPAL TUNNEL RELEASE  10+ years ago   bilateral   EYE SURGERY  3 yrs ago   bilateral cataracts   FOOT OSTEOTOMY  6 weeks ago   Left foot: great, 2nd & 3rd   FOOT OSTEOTOMY  5 years ago   Right great toe   HAND SURGERY Bilateral 2011-most recent   multiple hand surgeries, 2 on left, 3 on right   KNEE ARTHROPLASTY Right 04/28/2022   Procedure: COMPUTER ASSISTED TOTAL KNEE ARTHROPLASTY;  Surgeon: Donato Heinz, MD;  Location: [facility];  Service: Orthopedics;  Laterality: Right;   LOOP RECORDER INSERTION N/A 05/16/2020   Procedure: LOOP RECORDER INSERTION;  Surgeon: Marcina Millard, MD;  Location: [facility];  Service: Cardiovascular;  Laterality: N/A;   NASAL SINUS SURGERY  most recent 7-8 yrs ago   7 sinus surgeries    TRIGGER FINGER RELEASE  11/19/2011   Procedure: RELEASE TRIGGER FINGER/A-1 PULLEY;  Surgeon: Nicki Reaper, MD;  Location: [facility];  Service: Orthopedics;  Laterality: Right;  release a-1 pulley right index  finger and cyst removal   WRIST GANGLION EXCISION  1980's   right     ONSET DATE: 11/23/22  REFERRING DIAG: TIA  THERAPY DIAG:  Unsteadiness on feet  Muscle weakness (generalized)  Other lack of coordination  Difficulty in walking, not elsewhere classified  Acute left-sided weakness  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HCC)  Rationale for Evaluation and Treatment: Rehabilitation  SUBJECTIVE:                                                                                                                                                                                             SUBJECTIVE STATEMENT:   Pt reports that she is doing okay. Feels very woozy today.   Pt accompanied by: significant other  PERTINENT HISTORY: [name] is an 88 year-old female who was admitted to [facility] on 11/23/2022 with a cerebellar CVA. 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 Pt. Was admitted to  Inpatient Rehab from 12/21-1/02/2023. 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  Patient underwent inpatient rehabilitation for 2 weeks.  Patient was assessed and was scheduled for a knee replacement on 08/29/2022 however had to cancel it due to having had a CVA. PMH includes: anemia, arthritis, back pain, depression, hernia, GERD, HLD, HTN, hypothyroidism, knee pain, pancreatic hormone dysfunction, pneumonia, seizures, stroke, thyroid disease.  Patient has a donjoy brace for L knee.   PAIN:  Are you having pain? Yes, only with end range flexion and STS.    PRECAUTIONS: Fall, has latex allergy  WEIGHT BEARING RESTRICTIONS: No  FALLS: Has patient fallen in last 6 months? Yes. Number of falls 4 falls   LIVING ENVIRONMENT: Lives with: lives with their family and lives with their spouse Lives in: House/apartment Stairs: Yes: Internal: flight steps;   and External: 2 steps; on right going up and on left going up Has following equipment at home: Single point cane, Walker - 2 wheeled, Shower bench, and bed side commode  PLOF: Independent  PATIENT GOALS: to have less pain and move better  OBJECTIVE:   TODAY'S TREATMENT:  DATE: 10/08/23  Pt reports feeling Woozy at start of PT VS assess.  1145/68 HR 72. SpO2 99%.   Progress note to measure improvement towards LTG. See goal assessment for details.    Seated therex instructed by PT due to dizziness on this day, allowing limited time in standing:  HS curl x 12 YTB LAQ x 12 YTB  Hip abduction x 12 YTB  LE push down through thigh x 12 RTB  Ankle PF x 15 RTB  Due to increased dizziness and general  malaise. PT treatment suspended early.   PATIENT EDUCATION: Education details: Pt educated throughout session about proper posture and technique with exercises. Improved  exercise technique, movement at target joints, use of target muscles after min to mod verbal, visual, tactile cues.   HOME EXERCISE PROGRAM:  Access Code: [email or web address] URL: [email or web address] Date: 10/07/2023 Prepared by: Grier Rocher  Exercises - Standing March with Counter Support  - 1 x daily - 3 x weekly - 2 sets - 10 reps - Standing Hip Abduction with Unilateral Counter Support  - 1 x daily - 3 x weekly - 2 sets - 10 reps - 2 hold - Standing Hip Extension with Unilateral Counter Support  - 1 x daily - 3 x weekly - 2 sets - 10 reps - 2 hold - Supine Straight Leg Raises  - 1 x daily - 37 x weekly - 2 sets - 10 reps - Bridge  - 1 x daily - 3 x weekly - 2 sets - 10 reps - 2 hold - Supine Heel Slide  - 1 x daily - 3 x weekly - 2 sets - 10 reps  GOALS: Goals reviewed with patient? Yes  SHORT TERM GOALS: Target date: 09/19/23  Patient will be independent in starter HEP to improve A/ROM tolerance and preserve available ROM of LLE and to advance A/ROM and strength of RLE.  Baseline: 08/20/23: defer to vists 2-3  Goal status: NEW  LONG TERM GOALS: Target date: 10/20/23  Patient will increase FOTO score to equal to or greater than 65%  to demonstrate statistically significant improvement in ease of mobility.  Baseline:  47%; 06/23/23: 60;   7/16: 60%   10/31: 54 Goal status: Progressing   2.  Patient (> 91 years old) will complete five times sit to stand test in < 15 seconds indicating an increased RLE power.  Baseline: 5/7: unable to tolerate ; 7/16: 17.4sec 10/31: 11.02 Goal status: Progressing   3.   Patient to report tolerance of ad lib AMB in home during the day of distances >30ft >5x at supervision level or better to improve independence with ADL.    Baseline: 08/20/23: using transport chair for nearly all mobility, not tolerating walking due to WB limitations of LLE  10/31: able to ambulate 30-20ft with CGA from PT.  Goal status: Progressing      ASSESSMENT:  CLINICAL IMPRESSION:   Pt limited by "wooziness" on this day. Progress assessment performed. Improved function from initial Foto, but reduced from July, improved time on 5x STS and improved tolerance to gait in home as noted in prior PT sessions. Session terminated early due to general malaise.   The patient will benefit from skilled physical therapy to reduce pain, improve mobility, improve strength and improve quality of life.     OBJECTIVE IMPAIRMENTS: Abnormal gait, cardiopulmonary status limiting activity, decreased activity tolerance, decreased balance, decreased cognition, decreased endurance, decreased knowledge of use of DME, decreased mobility, difficulty walking, decreased ROM, decreased  strength, decreased safety awareness, hypomobility, increased fascial restrictions, impaired perceived functional ability, impaired flexibility, impaired UE functional use, improper body mechanics, postural dysfunction, and pain.   ACTIVITY LIMITATIONS: carrying, lifting, bending, sitting, standing, squatting, stairs, transfers, bed mobility, bathing, toileting, dressing, reach over head, hygiene/grooming, locomotion level, and caring for others  PARTICIPATION LIMITATIONS: meal prep, cleaning, laundry, medication management, personal finances, interpersonal relationship, driving, shopping, community activity, yard work, school, and church  PERSONAL FACTORS: Age, Behavior pattern, Education, Fitness, Past/current experiences, Time since onset of injury/illness/exacerbation, and 3+ comorbidities: anemia, arthritis, back pain, depression, hernia, GERD, HLD, HTN, hypothyroidism, knee pain, pancreatic hormone dysfunction, pneumonia, seizures, stroke, thyroid disease  are also affecting patient's functional outcome.   REHAB POTENTIAL: Fair    CLINICAL DECISION MAKING: Evolving/moderate complexity  EVALUATION COMPLEXITY: Moderate  PLAN:  PT FREQUENCY: 1-2x/week  PT DURATION: 12  weeks  PLANNED INTERVENTIONS: Therapeutic exercises, Therapeutic activity, Neuromuscular re-education, Balance training, Gait training, Patient/Family education, Self Care, Joint mobilization, Joint manipulation, Stair training, Vestibular training, Canalith repositioning, Visual/preceptual remediation/compensation, Orthotic/Fit training, DME instructions, Electrical stimulation, Wheelchair mobility training, Spinal mobilization, Cryotherapy, Moist heat, Compression bandaging, scar mobilization, Splintting, Taping, Traction, Ultrasound, Manual therapy, and Re-evaluation  PLAN FOR NEXT SESSION:  Tolerance to standing in functional movement/ tasks.  Continue BLE strengthening in weighted and unweighted position   Golden Pop, PT, DPT 10/08/2023, 5:18 PM   5:18 PM, 10/08/23  Physical Therapist - [facility]  [phone] [facility])

## 2023-10-08 NOTE — Therapy (Signed)
Occupational Therapy Neuro Treatment Note    Patient Name: [name] MRN: [id number] DOB:09-05-1941, 82 y.o., female Today's Date: 10/08/2023  PCP: Dr. Judithann Sheen REFERRING PROVIDER:  Dr. Judithann Sheen  END OF SESSION:  PLAN:   OT End of Session - 10/08/23 1809     Visit Number [id number]    Number of Visits 96    Date for OT Re-Evaluation 11/12/23    OT Start Time 1530    OT Stop Time 1615    OT Time Calculation (min) 45 min    Activity Tolerance Patient tolerated treatment well    Behavior During Therapy WFL for tasks assessed/performed                   Past Medical History:  Diagnosis Date   Anemia    Arthritis    Asthma    uses inhaler just prior to surgery to avoid attack   Back pain    from previous injury   Complication of anesthesia    has woken  up during 2 different surgery   Depression    no current issue/treatment; situation   Gallstones    GERD (gastroesophageal reflux disease)    Hiatal hernia    patient does NOT have nerve/muscle disease   History of kidney stones    HLD (hyperlipidemia)    HTN (hypertension)    Hypothyroidism    Kidney stones    Knee pain    Nausea and vomiting 10/15/2022   Non-diabetic pancreatic hormone dysfunction years   pt. 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   Pancreatitis    Pneumonia    Seizures (HCC)    caused by dye injected during a procedure   Shortness of breath    with exertion   Sinus problem    frequent infections/congestion   Stroke (HCC) 2021   reports having CVA in 2021 and having mini strokes before that   Thyroid disease    Past Surgical History:  Procedure Laterality Date   ABDOMINAL HYSTERECTOMY     APPENDECTOMY     CARPAL TUNNEL RELEASE  10+ years ago   bilateral   EYE SURGERY  3 yrs ago   bilateral cataracts   FOOT OSTEOTOMY  6 weeks ago   Left foot: great, 2nd & 3rd   FOOT OSTEOTOMY  5 years ago   Right great toe   HAND SURGERY Bilateral 2011-most recent   multiple hand surgeries, 2  on left, 3 on right   KNEE ARTHROPLASTY Right 04/28/2022   Procedure: COMPUTER ASSISTED TOTAL KNEE ARTHROPLASTY;  Surgeon: Donato Heinz, MD;  Location: [facility];  Service: Orthopedics;  Laterality: Right;   LOOP RECORDER INSERTION N/A 05/16/2020   Procedure: LOOP RECORDER INSERTION;  Surgeon: Marcina Millard, MD;  Location: [facility];  Service: Cardiovascular;  Laterality: N/A;   NASAL SINUS SURGERY  most recent 7-8 yrs ago   7 sinus surgeries    TRIGGER FINGER RELEASE  11/19/2011   Procedure: RELEASE TRIGGER FINGER/A-1 PULLEY;  Surgeon: Nicki Reaper, MD;  Location: [facility];  Service: Orthopedics;  Laterality: Right;  release a-1 pulley right index finger and cyst removal   WRIST GANGLION EXCISION  1980's   right   Patient Active Problem List   Diagnosis Date Noted   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 11/27/2022   Hypomagnesemia 11/27/2022   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   HLD (hyperlipidemia) 11/23/2022   Asthma 11/23/2022   Depression with  anxiety 11/23/2022   Chronic diastolic CHF (congestive heart failure) (HCC) 11/23/2022   Normocytic anemia 11/23/2022   Aspiration pneumonia (HCC) 11/23/2022   AKI (acute kidney injury) (HCC) 11/23/2022   Abdominal pain 11/23/2022   Mesenteric mass 11/23/2022   Coffee ground emesis 11/23/2022   Nausea and vomiting 10/15/2022   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   Fatigue 09/17/2022   Left hemiparesis (HCC) 09/17/2022   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HCC) 08/22/2022   GERD (gastroesophageal reflux disease) 08/21/2022   Agitation 08/20/2022   Acute left-sided weakness 08/20/2022   Expressive aphasia    Stroke (HCC) 08/19/2022   Leukocytosis 08/19/2022   History of urticaria 04/28/2022   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   Primary osteoarthritis of left knee 02/24/2022   Primary osteoarthritis of right knee 02/24/2022   Lumbar spondylolysis 04/12/2020   History of CVA  (cerebrovascular accident) 03/26/2020   Low back pain radiating to right lower extremity 03/21/2020   B12 deficiency 03/06/2020   Positive anti-CCP test 12/21/2019   Arthralgia 12/13/2019   Dermatitis 12/13/2019   Rheumatoid factor positive 12/13/2019   Essential hypertension 12/11/2018   Palpitations 12/11/2018   Acquired hypothyroidism 11/10/2018   Arthritis of knee 09/17/2016   Anxiety 11/22/2014   Asthma without status asthmaticus 11/22/2014   Benign neoplasm of colon, unspecified 11/22/2014   Environmental allergies 11/22/2014   Hypertriglyceridemia 11/22/2014   Hypokalemia 11/22/2014   Personal history of disease of skin and subcutaneous tissue 11/22/2014   REFERRING DIAG: CVA   THERAPY DIAG:  Muscle weakness (generalized)   Other lack of coordination   Rationale for Evaluation and Treatment Rehabilitation   SUBJECTIVE:   SUBJECTIVE STATEMENT:  Pt. reports doing okay today.  Pt accompanied by: significant other   PERTINENT HISTORY: Patient is an 82 year-old female who was admitted to [facility] on 11/23/2022 with a cerebellar CVA. 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 Pt. Was admitted to Inpatient Rehab from 12/21-1/02/2023. 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  Patient underwent inpatient rehabilitation for 2 weeks.  Patient was assessed and was scheduled for a knee replacement on 08/29/2022 however had to cancel it due to having had a CVA.  Patient had a recent fall 2 days after discharging from inpatient rehab. Past Medical History includes: Knee replacement, essential HTN, hypokalemia, leukocytosis, seizures, positive anti-- CCP test, anxiety disorder, mini strokes.  Patient had shingles with left eye nerve pain s/p 1 year ago.    PRECAUTIONS: Fall   WEIGHT BEARING RESTRICTIONS No   PAIN:  Are you having pain?  5/10 with left shoulder abduction   FALLS: Has patient fallen  in last 6 months? Yes. Number of falls 1   LIVING ENVIRONMENT: Lives with: Lives with Spouse Lives in: House/apartment Stairs: 2 storey home, resides on the first floor.  External: 2 stairs front no rails, and 6 in back with rails Has following equipment at home: Single point cane, Walker - 2 wheeled, Environmental consultant - 4 wheeled, Shower bench, and bed side commode   PLOF: Independent   PATIENT GOALS  To Regain the use of her left arm   OBJECTIVE:    HAND DOMINANCE: Right   ADLs: Overall ADLs: Husband assists pt. as needed Transfers/ambulation related to ADLs:Pt. Uses a 3 wheeled walker with Husband assist. Eating: Pt. Is independent with the right hand. Pt. has difficulty cutting food. Grooming: Pt. is using her right hand, however has difficulty sustaining her LUE in elevation to assist with  haircare. UB Dressing: Pt. Is independent donning a pullover shirt, and button down shirt. Has difficulty with buttoning, LB Dressing:  Independent donning pants, and socks. Difficulty tying shoes. Toileting: Independent Bathing: Pt. Is able to engage her right hand. Tub Shower transfers: Supervision Equipment: See above for equipment    IADLs: Shopping:  Has not had the opportunity for grocery shopping yet Light housekeeping: Husband is assisting with light house keeping Meal Prep:  Dependent Community mobility: Relies of family/friends Medication management: Husband assisting with weekly pillbox set-up, and administering medication. Financial management: TBD Handwriting: 75% legible   MOBILITY STATUS: Hx of falls   POSTURE COMMENTS:  No Significant postural limitations Sitting balance: supported sitting balance WFL   ACTIVITY TOLERANCE: Activity tolerance:  Fatigues in greater than 30 min.    FUNCTIONAL OUTCOME MEASURES: FOTO: 57   UPPER EXTREMITY ROM      Active ROM Right Eval: WFL Left eval Left  01/22/23 Left  03/03/23 Left  04/14/23 Left  05/28/2023 Left 07/23/2023  Left 08/20/2023 Left 09/17/23  Shoulder flexion   132 100 108 108 108 60(70) 70(78) 70(102)  Shoulder abduction   80 85 85 85 85 65(78) 72(85) 73(94)  Shoulder adduction             Shoulder extension             Shoulder internal rotation             Shoulder external rotation             Elbow flexion   140 140 WFL [facility] WFL  Elbow extension   WNL WNL [facility] WFL WFL  Wrist flexion   65 68        Wrist extension   -10 20 24  32 32 -10 20 20   Wrist ulnar deviation     12 10 14 14 10 18 18   Wrist radial deviation     8 14 14 16 12 14 14   Wrist pronation             Wrist supination             (Blank rows = not tested)   Left digit flexion to [facility]: 2nd: 0cm, 3rd: 0cm, 4th: 0cm, 5th: 0cm   Limited Left full 2nd digit extension     UPPER EXTREMITY MMT:      MMT Right Eval: 4+/5 overall Right 07/23/2023 Left Eval Left 01/22/23 Left 03/03/23 Left  04/14/2023 Left 05/28/2023 Left 07/23/2023 Left 08/20/2023 Left 09/17/23  Shoulder flexion   3+/5 3/5 3-/5 3-/5 3-/5 3-/5 2/5 2+/5 2+/5  Shoulder abduction   3+/5 3-/5 3-/5 3-/5 3-/5 3-/5 2/5 2+/5 2+/5  Shoulder adduction              Shoulder extension              Shoulder internal rotation              Shoulder external rotation              Middle trapezius              Lower trapezius              Elbow flexion   4/5 3+/5 4/5 N/A 4/5 4/5 3+/5 3+/5 3+/5  Elbow extension   4/5 3+/5 4/4 N/A 4/5 4/5 3+/5 3+/5 4-/5  Wrist flexion  Wrist extension   4/5 2-/5 3-/5 3-/5 3/5 3+/5 3-/5 3+/5 3+/5  Wrist ulnar deviation              Wrist radial deviation              Wrist pronation              Wrist supination              (Blank rows = not tested)   HAND FUNCTION: Grip strength: Right: 26#, Left: 10# Pinch strength: Right 8#, Left: 3#, 3 Pt. Pinch strength: Right: 9#, L: 2#  01/22/2023 Grip strength: Right: 26#, Left: 12# Pinch strength:  Pinch meter used at the initial eval has been sent out for  recalibration   03/03/2023 Grip strength: Right: 26#, Left: 13# Pinch strength:  Pinch meter used at the initial eval has been sent out for recalibration  04/14/2023: Grip strength: Right: 26#, Left: 13# Pinch strength:   Right 8#, Left: 4#, 3 Pt. Pinch strength: Right: 9#, L: 3#    05/28/2023: Grip strength: Right: 26#, Left: 13# Pinch strength:   Right 8#, Left: 2#, 3 Pt. Pinch strength: Right: 9#, L: 4#  07/23/2023  Grip strength: Right: 5#, Left: 2# Pinch strength:   Right 6#, Left: 2#, 3 Pt. Pinch strength: Right: 5#, L: 2#  08/20/2023:    Grip strength: Right: 8#, Left: 4# Pinch strength:   Right 6#, Left: 2#, 3 Pt. Pinch strength: Right: 5#, L: 2#    09/17/23:  Grip strength: Right: 5#, Left: 4# Pinch strength:   Right 6#, Left: 2#, 3 Pt. Pinch strength: Right: 5#, L: 2#   COORDINATION: Right: 22 sec., Left: <5 min. To place 7 pegs with increased compensation proximally in the trunk, and through reflexive associated reactions.  01/22/23 Right: 22 sec., Left: 3 min. & 4 sec.  03/03/23 Right: 22 sec., Left: 1 min. & 39 sec.  04/14/23   TBD  05/28/23 Right: 22 sec., Left:  5 min.  07/23/2023  Right: 33 sec., Left:  Pt. Is unable to grasp, and place pegs into the pegboard. Pt. Was able to remove 9 vertical pegs in 34 sec.  08/20/2023:  Right: 27 sec., Left:  5 pegs placed in 5 min.; Pt. Was able to remove 9 vertical pegs in 28 sec.  09/17/23:  Right: 26 sec., Left:  3 min, & 49 sec. pegs placed in 5 min.; Pt. Was able to remove 9 vertical pegs in 24 sec.         SENSATION: Light touch: WFL, proprioceptive awareness: Intact   EDEMA: N/A   MUSCLE TONE: LUE: Hypotonic   COGNITION: Overall cognitive status: WFL for tasks assessed. Pt. Is impulsive at times.   VISION: Subjective report: Pt. report having shingles affecting left eye  s/p 1 year. Has nerve pain Baseline vision: Wears glasses for reading only Visual history:  updated see clinical impression    VISION ASSESSMENT:    WFL for tasks performed   PERCEPTION: Intact   PRAXIS: Impaired: Motor planning   OBSERVATIONS:  Pt. more alert, and engaging since prior to the most recent hospitalization.   TODAY'S TREATMENT:    There. Ex.:   Pt. tolerated PROM/AAROM to the Left shoulder, elbow, wrist, and digits. Pt. worked on [employer], and reciprocal motion using the UBE while seated for 8 min. with no resistance. Constant monitoring was provided.   Neuromuscular re-education:  Pt. worked left hand [facility] skills grasping 1" circular pegs,  and placing them onto a pegboard. Pt. worked on formulating a 3pt. grasp pattern. Pt. worked on isolating 2nd digit extension to count, and skip spaces on the pegboard.  Pt. Worked with the circular pegs placed at an incline.        PATIENT EDUCATION: Education details: LUE therapeutic exercise Person educated: Patient and Spouse Education method: Medical illustrator Education comprehension: verbalized understanding, returned demonstration, and needs further education   HOME EXERCISE PROGRAM:   Reviewed activities at home to promote isolated 2nd digit extension.    GOALS: Goals reviewed with patient? Yes   SHORT TERM GOALS: Target date: 10/01/2023       1. Patient will be independent with home exercise program for the left upper extremity Baseline: 09/17/23: Independent 08/20/23: Pt. continues to require assist from he husband for HEPs 07/23/2023: Pt. continues to require assist. 05/28/2023: Pt. Requires assist 04/09/2023: Pt. Continues to consistently attempt to engage her hand at hand 02/01/2023: Pt. Consistently attempts to perform HEPs independently. No current home exercise program Goal status: Ongoing   LONG TERM GOALS: Target date:  08/20/2023   1.  Patient will improve left shoulder strength by 2 mm grades to be able to sustain UE's in elevation long enough to wash her hair.  Baseline: 09/17/2023: Left shoulder 2+/5, abduction  2+/5 08/20/2023: Left shoulder flexion 2+/5, abduction 2+/5  07/23/2023: Left shoulder flexion: 2/5, abduction: 2/5. 05/28/2023: Left shoulder flexion: 3-/5, abduction: 3-/5. Pt. has an old left shoulder injury limiting progression with left shoulder strength 05/26/2023: Pt. continues to present with limitiations in sustaining LUE elevation long enough to perform hair care thoroughly 04/09/2023: Pt. is limited with sustaining LUE elevation long enough to perform hair care thoroughly. 03/03/2023: Left shoulder flexion: 3-/5, abduction: 3-/5 01/22/2023: Left shoulder flexion: 3-/5, abduction: 3-/5 Eval: Left shoulder flexion: 3/5, abduction: 3-/5 Goal status: Ongoing   2.  Patient will improve left shoulder active abduction to be able to comb her hair Baseline: 09/17/2023: 73(94) Pt. is able to reach her left side, and back of hair. 9/11/09/2023: 72(85) Pt. is able to reach her left side, and back of hair. 07/23/2023: Left shoulder flexion: 60(70) abduction: 65(78) 05/28/2023: left shoulder flexion: 108 abduction: 85 Pt. Has an old left shoulder injury limiting progression with left shoulder ROM 05/26/2023:  5/10 left shoulder pain with ROM limits using it functionally during hair care. 04/09/2023: Pt. Presents with difficulty abducting her left shoulder enough to thoroughly complete haircare. 03/03/2023: Shoulder abduction: 85 01/22/2023: Shoulder abduction: 85 Eval: Left shoulder abduction is 80(108) Goal status: Improving, Ongoing   3.  Patient will independently button her shirt with modified independence. Baseline:09/17/2023: Pt. Has a buttonhook, however does not know where it is. Pt. requires increased time to complete, and assist from her husband. 08/20/2023: Pt. requires increased time to complete, and assist from her husband. 07/23/2023 Pt. presents with difficulty buttoning her shirt. 05/28/2023: pt. Continues to work towards progressing with buttoning 05/26/2023:  Pt. Continues to progress towards buttoning.  04/09/2023: Pt. Continues to progress towards buttoning. 03/03/2023: Pt. Continues to have difficulty with buttoning. 01/22/2023: Pt. continues to have difficulty. Eval: Patient has difficulty.  Goal status: Ongoing   4.  Patient well improve left grip strength in preparation for securely hold a glass/beverage Baseline: 09/17/2023: Grip strength: Right: 5#, Left: 4# Pt. Has difficulty holding a glass. Right: 8#, Left: 4# Pt. Is able to more securely hold mirrors, and flowers with the left hand, however has difficulty holding a glass beverage, or bottle.  08/20/2023: Right: 8#, Left: 4# Pt. Is able to more securely hold mirrors, and flowers with the left hand, however has difficulty holding a glass beverage, or bottle. 07/23/2023: Grip strength: Right: 5#, Left: 2# 05/28/2023: Right: 26#, Left: 13#  pt. Presents with difficulty securely holding flowers in her left hand. 05/26/2023: TBD 04/09/2023: Pt. Is able to hold, and hike pants with the left hand, continues to have difficulty with securely holding flowers.   03/03/2023: left grip strength: 13# 01/22/2023: Left: 12# Eval: Pt. Is unable to securely hold flowers. Goal status: Ongoing   5.  Pt. will independently recall adaptive  strategies for performing ADL/ADL kitchen tasks.  Baseline: 08/20/2023: Revised to include IADL kitchen tasks.  07/23/2023: Continue 05/28/2023: Pt. Needs continued education about adaptive strategies.05/26/2023: Continue 04/09/2023: Continue3/26/2024: Continue 01/22/2023: Pt. continues to benefit from education about adaptive strategies during ADLs, and IADLs. Eval: Pt. to be provided with adaptive strategies. Goal status: Revised   6.  Pt. will improve FOTO score by 2 points to reflect patient perceived performance improvement assessment specific ADLs  and IADLs Baseline: 16/09/9603: FOTO score: 61 07/23/2023: FOTO score: 57 05/28/2023: FOTO score 59 05/26/2023: TBD 5/02/204:  TBD 03/03/23: FOTO 62 Eval: 57 Goal status: Ongoing  7.  Pt. will  improve left hand coordination skills in order to be able to  manipulate small objects.     Baseline: 09/17/2023: Right: 26 sec., Left:  3 min, & 49 sec. pegs placed in 5 min.; Pt. Was able to remove 9 vertical pegs in 24 sec. 08/20/2023: Right: 27 sec., Left:  5 pegs placed in 5 min.; Pt. Was able to remove 9 vertical pegs in 28 sec. Pt. Is able to hold, and sort utensils in a drawer. Pt. Has difficulty manipulating small objects. 07/23/2023: Right: 33 sec., Left:  Pt. Is unable to grasp, and place pegs into the pegboard. Pt. Was able to remove 9 vertical pegs in 34 sec. 05/28/2023: Left:  5 min. 05/26/2023 Pt. Continues to present with difficulty handling and sorting utensils. 04/14/2023: 56 04/09/2023: TBD 03/03/23: 1 min. & 39 01/22/2022: Left: 3 min. & 4 sec. Eval: Pt. has difficulty sorting, and placing utensils with the left hand. Left FMC : >5 min. For 7 pegs on the 9 hole peg test.    Goal Status: Improving, Revised to be able to improve left hand FMC to be able to manipulate  small objects   8. Pt. will improve active left 2nd digit extension to be able able to isolate her 2nd digit in preparation for pressing/pushing buttons on appliances, phones, or remotes. Baseline: 09/17/2023: Pt. Presents with limited isolated 2nd digit extension with increased time. 08/20/2023: Pt. Is more consistently able to isolate 2nd digit extension with increased time. 07/23/2023: Pt. Continues to work on consistency with isolating left 2nd digit extension 05/28/2023: Pt. Continues to improve with left 2nd digit extension 05/26/2023: Improving with left 2nd digit extension. 04/09/2023: Pt. Is progressing with isolating left 2nd digit extension, however continues to present with limited increased flexor tone. 03/03/23: Pt. Continues to work on improving consistency with 2nd digit extension to press the remote. 01/22/2023: is able to perform full digit extension, although 2nd digit is slow to extend s 2/2 flexor tone. Pt.  Eval: Pt. is  able to is unable to actively perform full digit extension Goal status: Ongoing  9: Pt. Will cut food with modified independence    Baseline: 09/17/2023: Pt. Is unable to cut the food on her plate, as well as  cut food for meal preparation. 08/20/2023: Pt. Is unable to cut the food on her plate, as well as cut food for meal preparation.    Goal Status: Ongoing   ASSESSMENT:   CLINICAL IMPRESSION:  Pt. reports dizziness when working on [facility] while seated at the tabletop. BP117/56 with HR 70 bpms. Pt. reports 5/10 pain with left shoulder abduction. No pain with shoulder flexion, or any other shoulder movements. Pt. is more alert, with increased engagement as pt. reports feeling much better overall.  Pt. requires intervals of proprioception through the left UE, and hand during the [facility] task 2/2 tone in the left 2nd digit. Pt. presents with limited [facility] skills when attempting to perform [facility] tasks at a slight vertical incline/angle. Pt. continues to work on improving LUE ROM, strength, FMC, and hand function skills in order to work towards improving, and maximizing independence with ADLs, and IADL tasks.   PERFORMANCE DEFICITS in functional skills including ADLs, IADLs, coordination, proprioception, ROM, strength, FMC, and GMC, cognitive skills including memory, and psychosocial skills including coping strategies, environmental adaptation, interpersonal interactions, and routines and behaviors.    IMPAIRMENTS are limiting patient from ADLs, IADLs, education, leisure, and social participation.    COMORBIDITIES may have co-morbidities  that affects occupational performance. Patient will benefit from skilled OT to address above impairments and improve overall function.   MODIFICATION OR ASSISTANCE TO COMPLETE EVALUATION: Min-Moderate modification of tasks or assist with assess necessary to complete an evaluation.   OT OCCUPATIONAL PROFILE AND HISTORY: Detailed assessment: Review of records and additional  review of physical, cognitive, psychosocial history related to current functional performance.   CLINICAL DECISION MAKING: Moderate - several treatment options, min-mod task modification necessary   REHAB POTENTIAL: Good   EVALUATION COMPLEXITY: Moderate      PLAN: OT FREQUENCY: 2x/week   OT DURATION: 12 weeks   PLANNED INTERVENTIONS: self care/ADL training, therapeutic exercise, therapeutic activity, neuromuscular re-education, manual therapy, passive range of motion, functional mobility training, electrical stimulation, and paraffin   RECOMMENDED OTHER SERVICES: PT   CONSULTED AND AGREED WITH PLAN OF CARE: Patient and family member/caregiver   PLAN FOR NEXT SESSION: see above    Olegario Messier, MS, OTR/L  10/08/2023

## 2023-10-12 DIAGNOSIS — M1712 Unilateral primary osteoarthritis, left knee: Secondary | ICD-10-CM | POA: Diagnosis not present

## 2023-10-13 ENCOUNTER — Ambulatory Visit: Payer: Medicare HMO | Attending: Physical Medicine and Rehabilitation | Admitting: Occupational Therapy

## 2023-10-13 ENCOUNTER — Ambulatory Visit: Payer: Medicare HMO | Admitting: Physical Therapy

## 2023-10-13 DIAGNOSIS — R262 Difficulty in walking, not elsewhere classified: Secondary | ICD-10-CM

## 2023-10-13 DIAGNOSIS — I6381 Other cerebral infarction due to occlusion or stenosis of small artery: Secondary | ICD-10-CM | POA: Insufficient documentation

## 2023-10-13 DIAGNOSIS — R2681 Unsteadiness on feet: Secondary | ICD-10-CM | POA: Insufficient documentation

## 2023-10-13 DIAGNOSIS — M6281 Muscle weakness (generalized): Secondary | ICD-10-CM | POA: Insufficient documentation

## 2023-10-13 DIAGNOSIS — R278 Other lack of coordination: Secondary | ICD-10-CM | POA: Insufficient documentation

## 2023-10-13 DIAGNOSIS — R531 Weakness: Secondary | ICD-10-CM | POA: Diagnosis not present

## 2023-10-13 NOTE — Therapy (Signed)
Occupational Therapy Neuro Treatment Note    Patient Name: [name] MRN: [id number] DOB:[birth year], 81 y.o., female Today's Date: 10/13/2023  PCP: Dr. Judithann Sheen REFERRING PROVIDER:  Dr. Judithann Sheen  END OF SESSION:  PLAN:   OT End of Session - 10/13/23 1655     Visit Number [id number]    Number of Visits 96    Date for OT Re-Evaluation 11/12/23    Authorization Time Period Progress report period starting 12/16/2022    OT Start Time 1530    OT Stop Time 1615    OT Time Calculation (min) 45 min    Activity Tolerance Patient tolerated treatment well    Behavior During Therapy WFL for tasks assessed/performed                   Past Medical History:  Diagnosis Date   Anemia    Arthritis    Asthma    uses inhaler just prior to surgery to avoid attack   Back pain    from previous injury   Complication of anesthesia    has woken  up during 2 different surgery   Depression    no current issue/treatment; situation   Gallstones    GERD (gastroesophageal reflux disease)    Hiatal hernia    patient does NOT have nerve/muscle disease   History of kidney stones    HLD (hyperlipidemia)    HTN (hypertension)    Hypothyroidism    Kidney stones    Knee pain    Nausea and vomiting 10/15/2022   Non-diabetic pancreatic hormone dysfunction years   pt. 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   Pancreatitis    Pneumonia    Seizures (HCC)    caused by dye injected during a procedure   Shortness of breath    with exertion   Sinus problem    frequent infections/congestion   Stroke (HCC) 2021   reports having CVA in 2021 and having mini strokes before that   Thyroid disease    Past Surgical History:  Procedure Laterality Date   ABDOMINAL HYSTERECTOMY     APPENDECTOMY     CARPAL TUNNEL RELEASE  10+ years ago   bilateral   EYE SURGERY  3 yrs ago   bilateral cataracts   FOOT OSTEOTOMY  6 weeks ago   Left foot: great, 2nd & 3rd   FOOT OSTEOTOMY  5 years ago   Right great toe    HAND SURGERY Bilateral 2011-most recent   multiple hand surgeries, 2 on left, 3 on right   KNEE ARTHROPLASTY Right 04/28/2022   Procedure: COMPUTER ASSISTED TOTAL KNEE ARTHROPLASTY;  Surgeon: Donato Heinz, MD;  Location: [facility];  Service: Orthopedics;  Laterality: Right;   LOOP RECORDER INSERTION N/A 05/16/2020   Procedure: LOOP RECORDER INSERTION;  Surgeon: Marcina Millard, MD;  Location: [facility];  Service: Cardiovascular;  Laterality: N/A;   NASAL SINUS SURGERY  most recent 7-8 yrs ago   7 sinus surgeries    TRIGGER FINGER RELEASE  11/19/2011   Procedure: RELEASE TRIGGER FINGER/A-1 PULLEY;  Surgeon: Nicki Reaper, MD;  Location: [facility];  Service: Orthopedics;  Laterality: Right;  release a-1 pulley right index finger and cyst removal   WRIST GANGLION EXCISION  1980's   right   Patient Active Problem List   Diagnosis Date Noted   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 11/27/2022   Hypomagnesemia 11/27/2022   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  HLD (hyperlipidemia) 11/23/2022   Asthma 11/23/2022   Depression with anxiety 11/23/2022   Chronic diastolic CHF (congestive heart failure) (HCC) 11/23/2022   Normocytic anemia 11/23/2022   Aspiration pneumonia (HCC) 11/23/2022   AKI (acute kidney injury) (HCC) 11/23/2022   Abdominal pain 11/23/2022   Mesenteric mass 11/23/2022   Coffee ground emesis 11/23/2022   Nausea and vomiting 10/15/2022   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   Fatigue 09/17/2022   Left hemiparesis (HCC) 09/17/2022   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HCC) 08/22/2022   GERD (gastroesophageal reflux disease) 08/21/2022   Agitation 08/20/2022   Acute left-sided weakness 08/20/2022   Expressive aphasia    Stroke (HCC) 08/19/2022   Leukocytosis 08/19/2022   History of urticaria 04/28/2022   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   Primary osteoarthritis of left knee 02/24/2022   Primary osteoarthritis of right  knee 02/24/2022   Lumbar spondylolysis 04/12/2020   History of CVA (cerebrovascular accident) 03/26/2020   Low back pain radiating to right lower extremity 03/21/2020   B12 deficiency 03/06/2020   Positive anti-CCP test 12/21/2019   Arthralgia 12/13/2019   Dermatitis 12/13/2019   Rheumatoid factor positive 12/13/2019   Essential hypertension 12/11/2018   Palpitations 12/11/2018   Acquired hypothyroidism 11/10/2018   Arthritis of knee 09/17/2016   Anxiety 11/22/2014   Asthma without status asthmaticus 11/22/2014   Benign neoplasm of colon, unspecified 11/22/2014   Environmental allergies 11/22/2014   Hypertriglyceridemia 11/22/2014   Hypokalemia 11/22/2014   Personal history of disease of skin and subcutaneous tissue 11/22/2014   REFERRING DIAG: CVA   THERAPY DIAG:  Muscle weakness (generalized)   Other lack of coordination   Rationale for Evaluation and Treatment Rehabilitation   SUBJECTIVE:   SUBJECTIVE STATEMENT:  Pt. reports doing okay today.  Pt accompanied by: significant other   PERTINENT HISTORY: Patient is an 82 year-old female who was admitted to [facility] on 11/23/2022 with a cerebellar CVA. 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 Pt. Was admitted to Inpatient Rehab from 12/21-1/02/2023. 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  Patient underwent inpatient rehabilitation for 2 weeks.  Patient was assessed and was scheduled for a knee replacement on 08/29/2022 however had to cancel it due to having had a CVA.  Patient had a recent fall 2 days after discharging from inpatient rehab. Past Medical History includes: Knee replacement, essential HTN, hypokalemia, leukocytosis, seizures, positive anti-- CCP test, anxiety disorder, mini strokes.  Patient had shingles with left eye nerve pain s/p 1 year ago.    PRECAUTIONS: Fall   WEIGHT BEARING RESTRICTIONS No   PAIN:  Are you having  pain?  5/10 with left shoulder abduction   FALLS: Has patient fallen in last 6 months? Yes. Number of falls 1   LIVING ENVIRONMENT: Lives with: Lives with Spouse Lives in: House/apartment Stairs: 2 storey home, resides on the first floor.  External: 2 stairs front no rails, and 6 in back with rails Has following equipment at home: Single point cane, Walker - 2 wheeled, Environmental consultant - 4 wheeled, Shower bench, and bed side commode   PLOF: Independent   PATIENT GOALS  To Regain the use of her left arm   OBJECTIVE:    HAND DOMINANCE: Right   ADLs: Overall ADLs: Husband assists pt. as needed Transfers/ambulation related to ADLs:Pt. Uses a 3 wheeled walker with Husband assist. Eating: Pt. Is independent with the right hand. Pt. has difficulty cutting food. Grooming: Pt. is using her right hand,  however has difficulty sustaining her LUE in elevation to assist with haircare. UB Dressing: Pt. Is independent donning a pullover shirt, and button down shirt. Has difficulty with buttoning, LB Dressing:  Independent donning pants, and socks. Difficulty tying shoes. Toileting: Independent Bathing: Pt. Is able to engage her right hand. Tub Shower transfers: Supervision Equipment: See above for equipment    IADLs: Shopping:  Has not had the opportunity for grocery shopping yet Light housekeeping: Husband is assisting with light house keeping Meal Prep:  Dependent Community mobility: Relies of family/friends Medication management: Husband assisting with weekly pillbox set-up, and administering medication. Financial management: TBD Handwriting: 75% legible   MOBILITY STATUS: Hx of falls   POSTURE COMMENTS:  No Significant postural limitations Sitting balance: supported sitting balance WFL   ACTIVITY TOLERANCE: Activity tolerance:  Fatigues in greater than 30 min.    FUNCTIONAL OUTCOME MEASURES: FOTO: 57   UPPER EXTREMITY ROM      Active ROM Right Eval: WFL Left eval Left  01/22/23 Left   03/03/23 Left  04/14/23 Left  05/28/2023 Left 07/23/2023 Left 08/20/2023 Left 09/17/23  Shoulder flexion   132 100 108 108 108 60(70) 70(78) 70(102)  Shoulder abduction   80 85 85 85 85 65(78) 72(85) 73(94)  Shoulder adduction             Shoulder extension             Shoulder internal rotation             Shoulder external rotation             Elbow flexion   140 140 WFL [facility] WFL  Elbow extension   WNL WNL [facility] WFL WFL  Wrist flexion   65 68        Wrist extension   -10 20 24  32 32 -10 20 20   Wrist ulnar deviation     12 10 14 14 10 18 18   Wrist radial deviation     8 14 14 16 12 14 14   Wrist pronation             Wrist supination             (Blank rows = not tested)   Left digit flexion to [facility]: 2nd: 0cm, 3rd: 0cm, 4th: 0cm, 5th: 0cm   Limited Left full 2nd digit extension     UPPER EXTREMITY MMT:      MMT Right Eval: 4+/5 overall Right 07/23/2023 Left Eval Left 01/22/23 Left 03/03/23 Left  04/14/2023 Left 05/28/2023 Left 07/23/2023 Left 08/20/2023 Left 09/17/23  Shoulder flexion   3+/5 3/5 3-/5 3-/5 3-/5 3-/5 2/5 2+/5 2+/5  Shoulder abduction   3+/5 3-/5 3-/5 3-/5 3-/5 3-/5 2/5 2+/5 2+/5  Shoulder adduction              Shoulder extension              Shoulder internal rotation              Shoulder external rotation              Middle trapezius              Lower trapezius              Elbow flexion   4/5 3+/5 4/5 N/A 4/5 4/5 3+/5 3+/5 3+/5  Elbow extension   4/5 3+/5 4/4 N/A 4/5 4/5 3+/5 3+/5 4-/5  Wrist flexion  Wrist extension   4/5 2-/5 3-/5 3-/5 3/5 3+/5 3-/5 3+/5 3+/5  Wrist ulnar deviation              Wrist radial deviation              Wrist pronation              Wrist supination              (Blank rows = not tested)   HAND FUNCTION: Grip strength: Right: 26#, Left: 10# Pinch strength: Right 8#, Left: 3#, 3 Pt. Pinch strength: Right: 9#, L: 2#  01/22/2023 Grip strength: Right: 26#, Left: 12# Pinch strength:  Pinch  meter used at the initial eval has been sent out for recalibration   03/03/2023 Grip strength: Right: 26#, Left: 13# Pinch strength:  Pinch meter used at the initial eval has been sent out for recalibration  04/14/2023: Grip strength: Right: 26#, Left: 13# Pinch strength:   Right 8#, Left: 4#, 3 Pt. Pinch strength: Right: 9#, L: 3#    05/28/2023: Grip strength: Right: 26#, Left: 13# Pinch strength:   Right 8#, Left: 2#, 3 Pt. Pinch strength: Right: 9#, L: 4#  07/23/2023  Grip strength: Right: 5#, Left: 2# Pinch strength:   Right 6#, Left: 2#, 3 Pt. Pinch strength: Right: 5#, L: 2#  08/20/2023:    Grip strength: Right: 8#, Left: 4# Pinch strength:   Right 6#, Left: 2#, 3 Pt. Pinch strength: Right: 5#, L: 2#    09/17/23:  Grip strength: Right: 5#, Left: 4# Pinch strength:   Right 6#, Left: 2#, 3 Pt. Pinch strength: Right: 5#, L: 2#   COORDINATION: Right: 22 sec., Left: <5 min. To place 7 pegs with increased compensation proximally in the trunk, and through reflexive associated reactions.  01/22/23 Right: 22 sec., Left: 3 min. & 4 sec.  03/03/23 Right: 22 sec., Left: 1 min. & 39 sec.  04/14/23   TBD  05/28/23 Right: 22 sec., Left:  5 min.  07/23/2023  Right: 33 sec., Left:  Pt. Is unable to grasp, and place pegs into the pegboard. Pt. Was able to remove 9 vertical pegs in 34 sec.  08/20/2023:  Right: 27 sec., Left:  5 pegs placed in 5 min.; Pt. Was able to remove 9 vertical pegs in 28 sec.  09/17/23:  Right: 26 sec., Left:  3 min, & 49 sec. pegs placed in 5 min.; Pt. Was able to remove 9 vertical pegs in 24 sec.         SENSATION: Light touch: WFL, proprioceptive awareness: Intact   EDEMA: N/A   MUSCLE TONE: LUE: Hypotonic   COGNITION: Overall cognitive status: WFL for tasks assessed. Pt. Is impulsive at times.   VISION: Subjective report: Pt. report having shingles affecting left eye  s/p 1 year. Has nerve pain Baseline vision: Wears glasses for reading  only Visual history:  updated see clinical impression   VISION ASSESSMENT:    WFL for tasks performed   PERCEPTION: Intact   PRAXIS: Impaired: Motor planning   OBSERVATIONS:  Pt. more alert, and engaging since prior to the most recent hospitalization.   TODAY'S TREATMENT:    There. Ex.:   Pt. tolerated PROM/AAROM to the Left shoulder, elbow, wrist, and digits. Pt. worked on [employer], and reciprocal motion using the UBE while seated for 8 min. with no resistance. Constant monitoring was provided.   Neuromuscular re-education:  Pt. worked on Marketing executive in the left hand for  lateral, and 3pt. pinch using yellow, and red resistive clips. Pt. worked on placing the clips on a  horizontal angle. Tactile and verbal cues were required for eliciting the desired movement.         PATIENT EDUCATION: Education details: LUE therapeutic exercise Person educated: Patient and Spouse Education method: Medical illustrator Education comprehension: verbalized understanding, returned demonstration, and needs further education   HOME EXERCISE PROGRAM:   Reviewed activities at home to promote isolated 2nd digit extension.    GOALS: Goals reviewed with patient? Yes   SHORT TERM GOALS: Target date: 10/01/2023       1. Patient will be independent with home exercise program for the left upper extremity Baseline: 09/17/23: Independent 08/20/23: Pt. continues to require assist from he husband for HEPs 07/23/2023: Pt. continues to require assist. 05/28/2023: Pt. Requires assist 04/09/2023: Pt. Continues to consistently attempt to engage her hand at hand 02/01/2023: Pt. Consistently attempts to perform HEPs independently. No current home exercise program Goal status: Ongoing   LONG TERM GOALS: Target date:  08/20/2023   1.  Patient will improve left shoulder strength by 2 mm grades to be able to sustain UE's in elevation long enough to wash her hair.  Baseline: 09/17/2023: Left  shoulder 2+/5, abduction 2+/5 08/20/2023: Left shoulder flexion 2+/5, abduction 2+/5  07/23/2023: Left shoulder flexion: 2/5, abduction: 2/5. 05/28/2023: Left shoulder flexion: 3-/5, abduction: 3-/5. Pt. has an old left shoulder injury limiting progression with left shoulder strength 05/26/2023: Pt. continues to present with limitiations in sustaining LUE elevation long enough to perform hair care thoroughly 04/09/2023: Pt. is limited with sustaining LUE elevation long enough to perform hair care thoroughly. 03/03/2023: Left shoulder flexion: 3-/5, abduction: 3-/5 01/22/2023: Left shoulder flexion: 3-/5, abduction: 3-/5 Eval: Left shoulder flexion: 3/5, abduction: 3-/5 Goal status: Ongoing   2.  Patient will improve left shoulder active abduction to be able to comb her hair Baseline: 09/17/2023: 73(94) Pt. is able to reach her left side, and back of hair. 9/11/09/2023: 72(85) Pt. is able to reach her left side, and back of hair. 07/23/2023: Left shoulder flexion: 60(70) abduction: 65(78) 05/28/2023: left shoulder flexion: 108 abduction: 85 Pt. Has an old left shoulder injury limiting progression with left shoulder ROM 05/26/2023:  5/10 left shoulder pain with ROM limits using it functionally during hair care. 04/09/2023: Pt. Presents with difficulty abducting her left shoulder enough to thoroughly complete haircare. 03/03/2023: Shoulder abduction: 85 01/22/2023: Shoulder abduction: 85 Eval: Left shoulder abduction is 80(108) Goal status: Improving, Ongoing   3.  Patient will independently button her shirt with modified independence. Baseline:09/17/2023: Pt. Has a buttonhook, however does not know where it is. Pt. requires increased time to complete, and assist from her husband. 08/20/2023: Pt. requires increased time to complete, and assist from her husband. 07/23/2023 Pt. presents with difficulty buttoning her shirt. 05/28/2023: pt. Continues to work towards progressing with buttoning 05/26/2023:  Pt. Continues to progress  towards buttoning. 04/09/2023: Pt. Continues to progress towards buttoning. 03/03/2023: Pt. Continues to have difficulty with buttoning. 01/22/2023: Pt. continues to have difficulty. Eval: Patient has difficulty.  Goal status: Ongoing   4.  Patient well improve left grip strength in preparation for securely hold a glass/beverage Baseline: 09/17/2023: Grip strength: Right: 5#, Left: 4# Pt. Has difficulty holding a glass. Right: 8#, Left: 4# Pt. Is able to more securely hold mirrors, and flowers with the left hand, however has difficulty holding a glass beverage, or bottle. 08/20/2023: Right: 8#, Left: 4# Pt. Is  able to more securely hold mirrors, and flowers with the left hand, however has difficulty holding a glass beverage, or bottle. 07/23/2023: Grip strength: Right: 5#, Left: 2# 05/28/2023: Right: 26#, Left: 13#  pt. Presents with difficulty securely holding flowers in her left hand. 05/26/2023: TBD 04/09/2023: Pt. Is able to hold, and hike pants with the left hand, continues to have difficulty with securely holding flowers.   03/03/2023: left grip strength: 13# 01/22/2023: Left: 12# Eval: Pt. Is unable to securely hold flowers. Goal status: Ongoing   5.  Pt. will independently recall adaptive  strategies for performing ADL/ADL kitchen tasks.  Baseline: 08/20/2023: Revised to include IADL kitchen tasks.  07/23/2023: Continue 05/28/2023: Pt. Needs continued education about adaptive strategies.05/26/2023: Continue 04/09/2023: Continue3/26/2024: Continue 01/22/2023: Pt. continues to benefit from education about adaptive strategies during ADLs, and IADLs. Eval: Pt. to be provided with adaptive strategies. Goal status: Revised   6.  Pt. will improve FOTO score by 2 points to reflect patient perceived performance improvement assessment specific ADLs  and IADLs Baseline: 52/84/1324: FOTO score: 61 07/23/2023: FOTO score: 57 05/28/2023: FOTO score 59 05/26/2023: TBD 5/02/204:  TBD 03/03/23: FOTO 62 Eval: 57 Goal status:  Ongoing  7.  Pt. will improve left hand coordination skills in order to be able to  manipulate small objects.     Baseline: 09/17/2023: Right: 26 sec., Left:  3 min, & 49 sec. pegs placed in 5 min.; Pt. Was able to remove 9 vertical pegs in 24 sec. 08/20/2023: Right: 27 sec., Left:  5 pegs placed in 5 min.; Pt. Was able to remove 9 vertical pegs in 28 sec. Pt. Is able to hold, and sort utensils in a drawer. Pt. Has difficulty manipulating small objects. 07/23/2023: Right: 33 sec., Left:  Pt. Is unable to grasp, and place pegs into the pegboard. Pt. Was able to remove 9 vertical pegs in 34 sec. 05/28/2023: Left:  5 min. 05/26/2023 Pt. Continues to present with difficulty handling and sorting utensils. 04/14/2023: 56 04/09/2023: TBD 03/03/23: 1 min. & 39 01/22/2022: Left: 3 min. & 4 sec. Eval: Pt. has difficulty sorting, and placing utensils with the left hand. Left FMC : >5 min. For 7 pegs on the 9 hole peg test.    Goal Status: Improving, Revised to be able to improve left hand FMC to be able to manipulate  small objects   8. Pt. will improve active left 2nd digit extension to be able able to isolate her 2nd digit in preparation for pressing/pushing buttons on appliances, phones, or remotes. Baseline: 09/17/2023: Pt. Presents with limited isolated 2nd digit extension with increased time. 08/20/2023: Pt. Is more consistently able to isolate 2nd digit extension with increased time. 07/23/2023: Pt. Continues to work on consistency with isolating left 2nd digit extension 05/28/2023: Pt. Continues to improve with left 2nd digit extension 05/26/2023: Improving with left 2nd digit extension. 04/09/2023: Pt. Is progressing with isolating left 2nd digit extension, however continues to present with limited increased flexor tone. 03/03/23: Pt. Continues to work on improving consistency with 2nd digit extension to press the remote. 01/22/2023: is able to perform full digit extension, although 2nd digit is slow to extend s 2/2 flexor  tone. Pt.  Eval: Pt. is able to is unable to actively perform full digit extension Goal status: Ongoing  9: Pt. Will cut food with modified independence    Baseline: 09/17/2023: Pt. Is unable to cut the food on her plate, as well as cut food for meal preparation. 08/20/2023: Pt.  Is unable to cut the food on her plate, as well as cut food for meal preparation.    Goal Status: Ongoing   ASSESSMENT:   CLINICAL IMPRESSION:  Pt. was able to tolerate ROM exercises without pain today. Pt. tolerated pinch strengthening, and [facility] skills well today. Pt. presented with shoulder discomfort initially, however improved with ROM. Pt. was able to complete multiple reps with each for  yellow, and red resistive clips. Pt. Presented with difficulty performing translatory movements  moving them through her hand. Pt. continues to work on improving LUE ROM, strength, FMC, and hand function skills in order to work towards improving, and maximizing independence with ADLs, and IADL tasks.   PERFORMANCE DEFICITS in functional skills including ADLs, IADLs, coordination, proprioception, ROM, strength, FMC, and GMC, cognitive skills including memory, and psychosocial skills including coping strategies, environmental adaptation, interpersonal interactions, and routines and behaviors.    IMPAIRMENTS are limiting patient from ADLs, IADLs, education, leisure, and social participation.    COMORBIDITIES may have co-morbidities  that affects occupational performance. Patient will benefit from skilled OT to address above impairments and improve overall function.   MODIFICATION OR ASSISTANCE TO COMPLETE EVALUATION: Min-Moderate modification of tasks or assist with assess necessary to complete an evaluation.   OT OCCUPATIONAL PROFILE AND HISTORY: Detailed assessment: Review of records and additional review of physical, cognitive, psychosocial history related to current functional performance.   CLINICAL DECISION MAKING: Moderate -  several treatment options, min-mod task modification necessary   REHAB POTENTIAL: Good   EVALUATION COMPLEXITY: Moderate      PLAN: OT FREQUENCY: 2x/week   OT DURATION: 12 weeks   PLANNED INTERVENTIONS: self care/ADL training, therapeutic exercise, therapeutic activity, neuromuscular re-education, manual therapy, passive range of motion, functional mobility training, electrical stimulation, and paraffin   RECOMMENDED OTHER SERVICES: PT   CONSULTED AND AGREED WITH PLAN OF CARE: Patient and family member/caregiver   PLAN FOR NEXT SESSION: see above    Olegario Messier, MS, OTR/L  10/13/2023

## 2023-10-13 NOTE — Therapy (Signed)
OUTPATIENT PHYSICAL THERAPY TREATMENT/     Patient Name: [name] MRN: [id number] DOB:1941/03/27, 82 y.o., female Today's Date: 10/13/2023   PCP: Aram Beecham, D MD REFERRING PROVIDER: Jacquelynn Cree PA   END OF SESSION:  PT End of Session - 10/13/23 1636     Visit Number 11    Number of Visits 16    Date for PT Re-Evaluation 10/20/23    Authorization Type [employer]:    Authorization - Number of Visits 24    Progress Note Due on Visit 10    PT Start Time 1615    PT Stop Time 1655    PT Time Calculation (min) 40 min    Equipment Utilized During Treatment Gait belt    Activity Tolerance Patient tolerated treatment well    Behavior During Therapy WFL for tasks assessed/performed                 Past Medical History:  Diagnosis Date   Anemia    Arthritis    Asthma    uses inhaler just prior to surgery to avoid attack   Back pain    from previous injury   Complication of anesthesia    has woken  up during 2 different surgery   Depression    no current issue/treatment; situation   Gallstones    GERD (gastroesophageal reflux disease)    Hiatal hernia    patient does NOT have nerve/muscle disease   History of kidney stones    HLD (hyperlipidemia)    HTN (hypertension)    Hypothyroidism    Kidney stones    Knee pain    Nausea and vomiting 10/15/2022   Non-diabetic pancreatic hormone dysfunction years   pt. 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   Pancreatitis    Pneumonia    Seizures (HCC)    caused by dye injected during a procedure   Shortness of breath    with exertion   Sinus problem    frequent infections/congestion   Stroke (HCC) 2021   reports having CVA in 2021 and having mini strokes before that   Thyroid disease    Past Surgical History:  Procedure Laterality Date   ABDOMINAL HYSTERECTOMY     APPENDECTOMY     CARPAL TUNNEL RELEASE  10+ years ago   bilateral   EYE SURGERY  3 yrs ago   bilateral cataracts   FOOT  OSTEOTOMY  6 weeks ago   Left foot: great, 2nd & 3rd   FOOT OSTEOTOMY  5 years ago   Right great toe   HAND SURGERY Bilateral 2011-most recent   multiple hand surgeries, 2 on left, 3 on right   KNEE ARTHROPLASTY Right 04/28/2022   Procedure: COMPUTER ASSISTED TOTAL KNEE ARTHROPLASTY;  Surgeon: Donato Heinz, MD;  Location: [facility];  Service: Orthopedics;  Laterality: Right;   LOOP RECORDER INSERTION N/A 05/16/2020   Procedure: LOOP RECORDER INSERTION;  Surgeon: Marcina Millard, MD;  Location: [facility];  Service: Cardiovascular;  Laterality: N/A;   NASAL SINUS SURGERY  most recent 7-8 yrs ago   7 sinus surgeries    TRIGGER FINGER RELEASE  11/19/2011   Procedure: RELEASE TRIGGER FINGER/A-1 PULLEY;  Surgeon: Nicki Reaper, MD;  Location: [facility];  Service: Orthopedics;  Laterality: Right;  release a-1 pulley right index finger and cyst removal   WRIST GANGLION EXCISION  1980's   right     ONSET DATE: 11/23/22  REFERRING DIAG: TIA  THERAPY DIAG:  Unsteadiness on feet  Other lack of coordination  Muscle weakness (generalized)  Difficulty in walking, not elsewhere classified  Acute left-sided weakness  Rationale for Evaluation and Treatment: Rehabilitation  SUBJECTIVE:                                                                                                                                                                                             SUBJECTIVE STATEMENT:   Pt reports that she is doing okay. States that she has no pain at start of PT session. States that she completed third round of gel shot last week. Not as much improvement from this injection compared to initial injection.    Pt accompanied by: significant other  PERTINENT HISTORY: [name] is an 41 year-old female who was admitted to [facility] on 11/23/2022 with a cerebellar CVA. 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 Pt. Was admitted  to Inpatient Rehab from 12/21-1/02/2023. 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  Patient underwent inpatient rehabilitation for 2 weeks.  Patient was assessed and was scheduled for a knee replacement on 08/29/2022 however had to cancel it due to having had a CVA. PMH includes: anemia, arthritis, back pain, depression, hernia, GERD, HLD, HTN, hypothyroidism, knee pain, pancreatic hormone dysfunction, pneumonia, seizures, stroke, thyroid disease.  Patient has a donjoy brace for L knee.   PAIN:  Are you having pain? Yes, only with end range flexion and STS.    PRECAUTIONS: Fall, has latex allergy  WEIGHT BEARING RESTRICTIONS: No  FALLS: Has patient fallen in last 6 months? Yes. Number of falls 4 falls   LIVING ENVIRONMENT: Lives with: lives with their family and lives with their spouse Lives in: House/apartment Stairs: Yes: Internal: flight steps;   and External: 2 steps; on right going up and on left going up Has following equipment at home: Single point cane, Walker - 2 wheeled, Shower bench, and bed side commode  PLOF: Independent  PATIENT GOALS: to have less pain and move better  OBJECTIVE:   TODAY'S TREATMENT:  DATE: 10/13/23 Nustep level 2 x 5 min. Mild pain reported in the L knee, but described as an ache.   Gait with RW x 20ft with RW and supervision assist. Pt  noted to have improved control of the LUE on RW handle on this day compared to prior sessions.   Bed level therex:  SAQ AROM x 12, rates as easy SAQ with 3# AW x 12 with 3 sec hold rates as moderate.  Bridge x 10 with ball between thighs  Sidelying:  Hip abduction x 12  Clam shell x 12  Hip extension x 12  Sit<>supine x 2 with min assist on first bout and supervision assist on second bout with cues for proper LUE position and attention to RUE  to assist with transition in sitting.   Seated ankle DF/PF x 15 bil  Seated hip abduction over hedgehog x 12 bil   Pt performed stand pivot transfer back to [facility] with CGA and UE supported on arm rests.    PATIENT EDUCATION: Education details: Pt educated throughout session about proper posture and technique with exercises. Improved exercise technique, movement at target joints, use of target muscles after min to mod verbal, visual, tactile cues.   HOME EXERCISE PROGRAM:  Access Code: [email or web address] URL: [email or web address] Date: 10/07/2023 Prepared by: Grier Rocher  Exercises - Standing March with Counter Support  - 1 x daily - 3 x weekly - 2 sets - 10 reps - Standing Hip Abduction with Unilateral Counter Support  - 1 x daily - 3 x weekly - 2 sets - 10 reps - 2 hold - Standing Hip Extension with Unilateral Counter Support  - 1 x daily - 3 x weekly - 2 sets - 10 reps - 2 hold - Supine Straight Leg Raises  - 1 x daily - 37 x weekly - 2 sets - 10 reps - Bridge  - 1 x daily - 3 x weekly - 2 sets - 10 reps - 2 hold - Supine Heel Slide  - 1 x daily - 3 x weekly - 2 sets - 10 reps  GOALS: Goals reviewed with patient? Yes  SHORT TERM GOALS: Target date: 09/19/23  Patient will be independent in starter HEP to improve A/ROM tolerance and preserve available ROM of LLE and to advance A/ROM and strength of RLE.  Baseline: 08/20/23: defer to vists 2-3  Goal status: NEW  LONG TERM GOALS: Target date: 10/20/23  Patient will increase FOTO score to equal to or greater than 65%  to demonstrate statistically significant improvement in ease of mobility.  Baseline:  47%; 06/23/23: 60;   7/16: 60%   10/31: 54 Goal status: Progressing   2.  Patient (> 72 years old) will complete five times sit to stand test in < 15 seconds indicating an increased RLE power.  Baseline: 5/7: unable to tolerate ; 7/16: 17.4sec 10/31: 11.02 Goal status: Progressing   3.   Patient to report tolerance of ad  lib AMB in home during the day of distances >75ft >5x at supervision level or better to improve independence with ADL.    Baseline: 08/20/23: using transport chair for nearly all mobility, not tolerating walking due to WB limitations of LLE  10/31: able to ambulate 30-58ft with CGA from PT.  Goal status: Progressing     ASSESSMENT:  CLINICAL IMPRESSION:   Pt presented to PT session motivated to participate. No pain reported throughout. PT treatment focused BLE strengthening. Noted to have decreased ROM in hip abduction  ROM on the RLE compared to the L. Able to perform short distance gait with supervision assist on this day, but required CGA for safety in transfer. Mild improvement in RUE coordination noted on this day. Was able to use RUE to push into sitting with cues for sequencing.  The patient will benefit from skilled physical therapy to reduce pain, improve mobility, improve strength and improve quality of life.     OBJECTIVE IMPAIRMENTS: Abnormal gait, cardiopulmonary status limiting activity, decreased activity tolerance, decreased balance, decreased cognition, decreased endurance, decreased knowledge of use of DME, decreased mobility, difficulty walking, decreased ROM, decreased strength, decreased safety awareness, hypomobility, increased fascial restrictions, impaired perceived functional ability, impaired flexibility, impaired UE functional use, improper body mechanics, postural dysfunction, and pain.   ACTIVITY LIMITATIONS: carrying, lifting, bending, sitting, standing, squatting, stairs, transfers, bed mobility, bathing, toileting, dressing, reach over head, hygiene/grooming, locomotion level, and caring for others  PARTICIPATION LIMITATIONS: meal prep, cleaning, laundry, medication management, personal finances, interpersonal relationship, driving, shopping, community activity, yard work, school, and church  PERSONAL FACTORS: Age, Behavior pattern, Education, Fitness, Past/current  experiences, Time since onset of injury/illness/exacerbation, and 3+ comorbidities: anemia, arthritis, back pain, depression, hernia, GERD, HLD, HTN, hypothyroidism, knee pain, pancreatic hormone dysfunction, pneumonia, seizures, stroke, thyroid disease  are also affecting patient's functional outcome.   REHAB POTENTIAL: Fair    CLINICAL DECISION MAKING: Evolving/moderate complexity  EVALUATION COMPLEXITY: Moderate  PLAN:  PT FREQUENCY: 1-2x/week  PT DURATION: 12 weeks  PLANNED INTERVENTIONS: Therapeutic exercises, Therapeutic activity, Neuromuscular re-education, Balance training, Gait training, Patient/Family education, Self Care, Joint mobilization, Joint manipulation, Stair training, Vestibular training, Canalith repositioning, Visual/preceptual remediation/compensation, Orthotic/Fit training, DME instructions, Electrical stimulation, Wheelchair mobility training, Spinal mobilization, Cryotherapy, Moist heat, Compression bandaging, scar mobilization, Splintting, Taping, Traction, Ultrasound, Manual therapy, and Re-evaluation  PLAN FOR NEXT SESSION:  Tolerance to standing in functional movement/ tasks.  Continue BLE strengthening in weighted and unweighted position   Golden Pop, PT, DPT 10/13/2023, 5:08 PM   5:08 PM, 10/13/23  Physical Therapist - [facility]  [phone] [facility])

## 2023-10-15 ENCOUNTER — Ambulatory Visit: Payer: Medicare HMO | Admitting: Physical Therapy

## 2023-10-15 ENCOUNTER — Ambulatory Visit: Payer: Medicare HMO | Admitting: Occupational Therapy

## 2023-10-20 ENCOUNTER — Ambulatory Visit: Payer: Medicare HMO | Admitting: Occupational Therapy

## 2023-10-20 ENCOUNTER — Ambulatory Visit: Payer: Medicare HMO | Admitting: Physical Therapy

## 2023-10-20 DIAGNOSIS — M6281 Muscle weakness (generalized): Secondary | ICD-10-CM

## 2023-10-20 DIAGNOSIS — R262 Difficulty in walking, not elsewhere classified: Secondary | ICD-10-CM

## 2023-10-20 DIAGNOSIS — R278 Other lack of coordination: Secondary | ICD-10-CM | POA: Diagnosis not present

## 2023-10-20 DIAGNOSIS — R531 Weakness: Secondary | ICD-10-CM | POA: Diagnosis not present

## 2023-10-20 DIAGNOSIS — I6381 Other cerebral infarction due to occlusion or stenosis of small artery: Secondary | ICD-10-CM | POA: Diagnosis not present

## 2023-10-20 DIAGNOSIS — R2681 Unsteadiness on feet: Secondary | ICD-10-CM | POA: Diagnosis not present

## 2023-10-20 NOTE — Therapy (Addendum)
Occupational Therapy Neuro Treatment Note    Patient Name: [name] MRN: [id number] DOB:[birth year], 82 y.o., female Today's Date: 10/20/2023  PCP: Dr. Judithann Sheen REFERRING PROVIDER:  Dr. Judithann Sheen  END OF SESSION:  PLAN:   OT End of Session - 10/20/23 1733     Visit Number 68    Number of Visits 96    Date for OT Re-Evaluation 11/12/23    OT Start Time 1530    OT Stop Time 1615    OT Time Calculation (min) 45 min    Activity Tolerance Patient tolerated treatment well    Behavior During Therapy WFL for tasks assessed/performed                   Past Medical History:  Diagnosis Date   Anemia    Arthritis    Asthma    uses inhaler just prior to surgery to avoid attack   Back pain    from previous injury   Complication of anesthesia    has woken  up during 2 different surgery   Depression    no current issue/treatment; situation   Gallstones    GERD (gastroesophageal reflux disease)    Hiatal hernia    patient does NOT have nerve/muscle disease   History of kidney stones    HLD (hyperlipidemia)    HTN (hypertension)    Hypothyroidism    Kidney stones    Knee pain    Nausea and vomiting 10/15/2022   Non-diabetic pancreatic hormone dysfunction years   pt. 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   Pancreatitis    Pneumonia    Seizures (HCC)    caused by dye injected during a procedure   Shortness of breath    with exertion   Sinus problem    frequent infections/congestion   Stroke (HCC) 2021   reports having CVA in 2021 and having mini strokes before that   Thyroid disease    Past Surgical History:  Procedure Laterality Date   ABDOMINAL HYSTERECTOMY     APPENDECTOMY     CARPAL TUNNEL RELEASE  10+ years ago   bilateral   EYE SURGERY  3 yrs ago   bilateral cataracts   FOOT OSTEOTOMY  6 weeks ago   Left foot: great, 2nd & 3rd   FOOT OSTEOTOMY  5 years ago   Right great toe   HAND SURGERY Bilateral 2011-most recent   multiple hand surgeries, 2  on left, 3 on right   KNEE ARTHROPLASTY Right 04/28/2022   Procedure: COMPUTER ASSISTED TOTAL KNEE ARTHROPLASTY;  Surgeon: Donato Heinz, MD;  Location: [facility];  Service: Orthopedics;  Laterality: Right;   LOOP RECORDER INSERTION N/A 05/16/2020   Procedure: LOOP RECORDER INSERTION;  Surgeon: Marcina Millard, MD;  Location: [facility];  Service: Cardiovascular;  Laterality: N/A;   NASAL SINUS SURGERY  most recent 7-8 yrs ago   7 sinus surgeries    TRIGGER FINGER RELEASE  11/19/2011   Procedure: RELEASE TRIGGER FINGER/A-1 PULLEY;  Surgeon: Nicki Reaper, MD;  Location: [facility];  Service: Orthopedics;  Laterality: Right;  release a-1 pulley right index finger and cyst removal   WRIST GANGLION EXCISION  1980's   right   Patient Active Problem List   Diagnosis Date Noted   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 11/27/2022   Hypomagnesemia 11/27/2022   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   HLD (hyperlipidemia) 11/23/2022   Asthma 11/23/2022   Depression with  anxiety 11/23/2022   Chronic diastolic CHF (congestive heart failure) (HCC) 11/23/2022   Normocytic anemia 11/23/2022   Aspiration pneumonia (HCC) 11/23/2022   AKI (acute kidney injury) (HCC) 11/23/2022   Abdominal pain 11/23/2022   Mesenteric mass 11/23/2022   Coffee ground emesis 11/23/2022   Nausea and vomiting 10/15/2022   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   Fatigue 09/17/2022   Left hemiparesis (HCC) 09/17/2022   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HCC) 08/22/2022   GERD (gastroesophageal reflux disease) 08/21/2022   Agitation 08/20/2022   Acute left-sided weakness 08/20/2022   Expressive aphasia    Stroke (HCC) 08/19/2022   Leukocytosis 08/19/2022   History of urticaria 04/28/2022   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   Primary osteoarthritis of left knee 02/24/2022   Primary osteoarthritis of right knee 02/24/2022   Lumbar spondylolysis 04/12/2020   History of CVA  (cerebrovascular accident) 03/26/2020   Low back pain radiating to right lower extremity 03/21/2020   B12 deficiency 03/06/2020   Positive anti-CCP test 12/21/2019   Arthralgia 12/13/2019   Dermatitis 12/13/2019   Rheumatoid factor positive 12/13/2019   Essential hypertension 12/11/2018   Palpitations 12/11/2018   Acquired hypothyroidism 11/10/2018   Arthritis of knee 09/17/2016   Anxiety 11/22/2014   Asthma without status asthmaticus 11/22/2014   Benign neoplasm of colon, unspecified 11/22/2014   Environmental allergies 11/22/2014   Hypertriglyceridemia 11/22/2014   Hypokalemia 11/22/2014   Personal history of disease of skin and subcutaneous tissue 11/22/2014   REFERRING DIAG: CVA   THERAPY DIAG:  Muscle weakness (generalized)   Other lack of coordination   Rationale for Evaluation and Treatment Rehabilitation   SUBJECTIVE:   SUBJECTIVE STATEMENT:  Pt. reports doing okay today.  Pt accompanied by: significant other   PERTINENT HISTORY: Patient is an 82 year-old female who was admitted to [facility] on 11/23/2022 with a cerebellar CVA. 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 Pt. Was admitted to Inpatient Rehab from 12/21-1/02/2023. 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  Patient underwent inpatient rehabilitation for 2 weeks.  Patient was assessed and was scheduled for a knee replacement on 08/29/2022 however had to cancel it due to having had a CVA.  Patient had a recent fall 2 days after discharging from inpatient rehab. Past Medical History includes: Knee replacement, essential HTN, hypokalemia, leukocytosis, seizures, positive anti-- CCP test, anxiety disorder, mini strokes.  Patient had shingles with left eye nerve pain s/p 1 year ago.    PRECAUTIONS: Fall   WEIGHT BEARING RESTRICTIONS No   PAIN:  Are you having pain?  No pain.   FALLS: Has patient fallen in last 6 months? Yes.  Number of falls 1   LIVING ENVIRONMENT: Lives with: Lives with Spouse Lives in: House/apartment Stairs: 2 storey home, resides on the first floor.  External: 2 stairs front no rails, and 6 in back with rails Has following equipment at home: Single point cane, Walker - 2 wheeled, Environmental consultant - 4 wheeled, Shower bench, and bed side commode   PLOF: Independent   PATIENT GOALS  To Regain the use of her left arm   OBJECTIVE:    HAND DOMINANCE: Right   ADLs: Overall ADLs: Husband assists pt. as needed Transfers/ambulation related to ADLs:Pt. Uses a 3 wheeled walker with Husband assist. Eating: Pt. Is independent with the right hand. Pt. has difficulty cutting food. Grooming: Pt. is using her right hand, however has difficulty sustaining her LUE in elevation to assist with haircare. UB Dressing:  Pt. Is independent donning a pullover shirt, and button down shirt. Has difficulty with buttoning, LB Dressing:  Independent donning pants, and socks. Difficulty tying shoes. Toileting: Independent Bathing: Pt. Is able to engage her right hand. Tub Shower transfers: Supervision Equipment: See above for equipment    IADLs: Shopping:  Has not had the opportunity for grocery shopping yet Light housekeeping: Husband is assisting with light house keeping Meal Prep:  Dependent Community mobility: Relies of family/friends Medication management: Husband assisting with weekly pillbox set-up, and administering medication. Financial management: TBD Handwriting: 75% legible   MOBILITY STATUS: Hx of falls   POSTURE COMMENTS:  No Significant postural limitations Sitting balance: supported sitting balance WFL   ACTIVITY TOLERANCE: Activity tolerance:  Fatigues in greater than 30 min.    FUNCTIONAL OUTCOME MEASURES: FOTO: 57   UPPER EXTREMITY ROM      Active ROM Right Eval: WFL Left eval Left  01/22/23 Left  03/03/23 Left  04/14/23 Left  05/28/2023 Left 07/23/2023 Left 08/20/2023 Left 09/17/23   Shoulder flexion   132 100 108 108 108 60(70) 70(78) 70(102)  Shoulder abduction   80 85 85 85 85 65(78) 72(85) 73(94)  Shoulder adduction             Shoulder extension             Shoulder internal rotation             Shoulder external rotation             Elbow flexion   140 140 WFL [facility] WFL  Elbow extension   WNL WNL [facility] WFL WFL  Wrist flexion   65 68        Wrist extension   -10 20 24  32 32 -10 20 20   Wrist ulnar deviation     12 10 14 14 10 18 18   Wrist radial deviation     8 14 14 16 12 14 14   Wrist pronation             Wrist supination             (Blank rows = not tested)   Left digit flexion to [facility]: 2nd: 0cm, 3rd: 0cm, 4th: 0cm, 5th: 0cm   Limited Left full 2nd digit extension     UPPER EXTREMITY MMT:      MMT Right Eval: 4+/5 overall Right 07/23/2023 Left Eval Left 01/22/23 Left 03/03/23 Left  04/14/2023 Left 05/28/2023 Left 07/23/2023 Left 08/20/2023 Left 09/17/23  Shoulder flexion   3+/5 3/5 3-/5 3-/5 3-/5 3-/5 2/5 2+/5 2+/5  Shoulder abduction   3+/5 3-/5 3-/5 3-/5 3-/5 3-/5 2/5 2+/5 2+/5  Shoulder adduction              Shoulder extension              Shoulder internal rotation              Shoulder external rotation              Middle trapezius              Lower trapezius              Elbow flexion   4/5 3+/5 4/5 N/A 4/5 4/5 3+/5 3+/5 3+/5  Elbow extension   4/5 3+/5 4/4 N/A 4/5 4/5 3+/5 3+/5 4-/5  Wrist flexion              Wrist extension  4/5 2-/5 3-/5 3-/5 3/5 3+/5 3-/5 3+/5 3+/5  Wrist ulnar deviation              Wrist radial deviation              Wrist pronation              Wrist supination              (Blank rows = not tested)   HAND FUNCTION: Grip strength: Right: 26#, Left: 10# Pinch strength: Right 8#, Left: 3#, 3 Pt. Pinch strength: Right: 9#, L: 2#  01/22/2023 Grip strength: Right: 26#, Left: 12# Pinch strength:  Pinch meter used at the initial eval has been sent out for recalibration   03/03/2023 Grip  strength: Right: 26#, Left: 13# Pinch strength:  Pinch meter used at the initial eval has been sent out for recalibration  04/14/2023: Grip strength: Right: 26#, Left: 13# Pinch strength:   Right 8#, Left: 4#, 3 Pt. Pinch strength: Right: 9#, L: 3#    05/28/2023: Grip strength: Right: 26#, Left: 13# Pinch strength:   Right 8#, Left: 2#, 3 Pt. Pinch strength: Right: 9#, L: 4#  07/23/2023  Grip strength: Right: 5#, Left: 2# Pinch strength:   Right 6#, Left: 2#, 3 Pt. Pinch strength: Right: 5#, L: 2#  08/20/2023:    Grip strength: Right: 8#, Left: 4# Pinch strength:   Right 6#, Left: 2#, 3 Pt. Pinch strength: Right: 5#, L: 2#    09/17/23:  Grip strength: Right: 5#, Left: 4# Pinch strength:   Right 6#, Left: 2#, 3 Pt. Pinch strength: Right: 5#, L: 2#   COORDINATION: Right: 22 sec., Left: <5 min. To place 7 pegs with increased compensation proximally in the trunk, and through reflexive associated reactions.  01/22/23 Right: 22 sec., Left: 3 min. & 4 sec.  03/03/23 Right: 22 sec., Left: 1 min. & 39 sec.  04/14/23   TBD  05/28/23 Right: 22 sec., Left:  5 min.  07/23/2023  Right: 33 sec., Left:  Pt. Is unable to grasp, and place pegs into the pegboard. Pt. Was able to remove 9 vertical pegs in 34 sec.  08/20/2023:  Right: 27 sec., Left:  5 pegs placed in 5 min.; Pt. Was able to remove 9 vertical pegs in 28 sec.  09/17/23:  Right: 26 sec., Left:  3 min, & 49 sec. pegs placed in 5 min.; Pt. Was able to remove 9 vertical pegs in 24 sec.         SENSATION: Light touch: WFL, proprioceptive awareness: Intact   EDEMA: N/A   MUSCLE TONE: LUE: Hypotonic   COGNITION: Overall cognitive status: WFL for tasks assessed. Pt. Is impulsive at times.   VISION: Subjective report: Pt. report having shingles affecting left eye  s/p 1 year. Has nerve pain Baseline vision: Wears glasses for reading only Visual history:  updated see clinical impression   VISION ASSESSMENT:    WFL for  tasks performed   PERCEPTION: Intact   PRAXIS: Impaired: Motor planning   OBSERVATIONS:  Pt. more alert, and engaging since prior to the most recent hospitalization.   TODAY'S TREATMENT:    There. Ex.:   Pt. tolerated PROM/AAROM to the Left shoulder, elbow, wrist, and digits. Pt. worked on [employer], and reciprocal motion using the UBE while seated for 8 min. with no resistance.  Pt. performed resistive EZ Board exercises for forearm supination/pronation, wrist flexion/extension using gross grasp. Pt. performed resistive EZ Board exercises angled in  several planes to promote shoulder flexion, abduction, and wrist flexion, and extension while performing resistive wrist flexion and extension with a gross grip. Constant monitoring was provided.   Neuromuscular re-education:  Pt. worked on grasping 1" resistive cubes alternating thumb opposition to the tip of the 2nd  digits while the board is placed at a vertical angle. Pt. worked on pressing the cubes back into place while alternating isolated 2nd digit extension.         PATIENT EDUCATION: Education details: LUE therapeutic exercise Person educated: Patient and Spouse Education method: Medical illustrator Education comprehension: verbalized understanding, returned demonstration, and needs further education   HOME EXERCISE PROGRAM:   Reviewed activities at home to promote isolated 2nd digit extension.    GOALS: Goals reviewed with patient? Yes   SHORT TERM GOALS: Target date: 10/01/2023       1. Patient will be independent with home exercise program for the left upper extremity Baseline: 09/17/23: Independent 08/20/23: Pt. continues to require assist from he husband for HEPs 07/23/2023: Pt. continues to require assist. 05/28/2023: Pt. Requires assist 04/09/2023: Pt. Continues to consistently attempt to engage her hand at hand 02/01/2023: Pt. Consistently attempts to perform HEPs independently. No current home exercise  program Goal status: Ongoing   LONG TERM GOALS: Target date:  08/20/2023   1.  Patient will improve left shoulder strength by 2 mm grades to be able to sustain UE's in elevation long enough to wash her hair.  Baseline: 09/17/2023: Left shoulder 2+/5, abduction 2+/5 08/20/2023: Left shoulder flexion 2+/5, abduction 2+/5  07/23/2023: Left shoulder flexion: 2/5, abduction: 2/5. 05/28/2023: Left shoulder flexion: 3-/5, abduction: 3-/5. Pt. has an old left shoulder injury limiting progression with left shoulder strength 05/26/2023: Pt. continues to present with limitiations in sustaining LUE elevation long enough to perform hair care thoroughly 04/09/2023: Pt. is limited with sustaining LUE elevation long enough to perform hair care thoroughly. 03/03/2023: Left shoulder flexion: 3-/5, abduction: 3-/5 01/22/2023: Left shoulder flexion: 3-/5, abduction: 3-/5 Eval: Left shoulder flexion: 3/5, abduction: 3-/5 Goal status: Ongoing   2.  Patient will improve left shoulder active abduction to be able to comb her hair Baseline: 09/17/2023: 73(94) Pt. is able to reach her left side, and back of hair. 9/11/09/2023: 72(85) Pt. is able to reach her left side, and back of hair. 07/23/2023: Left shoulder flexion: 60(70) abduction: 65(78) 05/28/2023: left shoulder flexion: 108 abduction: 85 Pt. Has an old left shoulder injury limiting progression with left shoulder ROM 05/26/2023:  5/10 left shoulder pain with ROM limits using it functionally during hair care. 04/09/2023: Pt. Presents with difficulty abducting her left shoulder enough to thoroughly complete haircare. 03/03/2023: Shoulder abduction: 85 01/22/2023: Shoulder abduction: 85 Eval: Left shoulder abduction is 80(108) Goal status: Improving, Ongoing   3.  Patient will independently button her shirt with modified independence. Baseline:09/17/2023: Pt. Has a buttonhook, however does not know where it is. Pt. requires increased time to complete, and assist from her husband.  08/20/2023: Pt. requires increased time to complete, and assist from her husband. 07/23/2023 Pt. presents with difficulty buttoning her shirt. 05/28/2023: pt. Continues to work towards progressing with buttoning 05/26/2023:  Pt. Continues to progress towards buttoning. 04/09/2023: Pt. Continues to progress towards buttoning. 03/03/2023: Pt. Continues to have difficulty with buttoning. 01/22/2023: Pt. continues to have difficulty. Eval: Patient has difficulty.  Goal status: Ongoing   4.  Patient well improve left grip strength in preparation for securely hold a glass/beverage Baseline: 09/17/2023: Grip strength: Right: 5#,  Left: 4# Pt. Has difficulty holding a glass. Right: 8#, Left: 4# Pt. Is able to more securely hold mirrors, and flowers with the left hand, however has difficulty holding a glass beverage, or bottle. 08/20/2023: Right: 8#, Left: 4# Pt. Is able to more securely hold mirrors, and flowers with the left hand, however has difficulty holding a glass beverage, or bottle. 07/23/2023: Grip strength: Right: 5#, Left: 2# 05/28/2023: Right: 26#, Left: 13#  pt. Presents with difficulty securely holding flowers in her left hand. 05/26/2023: TBD 04/09/2023: Pt. Is able to hold, and hike pants with the left hand, continues to have difficulty with securely holding flowers.   03/03/2023: left grip strength: 13# 01/22/2023: Left: 12# Eval: Pt. Is unable to securely hold flowers. Goal status: Ongoing   5.  Pt. will independently recall adaptive  strategies for performing ADL/ADL kitchen tasks.  Baseline: 08/20/2023: Revised to include IADL kitchen tasks.  07/23/2023: Continue 05/28/2023: Pt. Needs continued education about adaptive strategies.05/26/2023: Continue 04/09/2023: Continue3/26/2024: Continue 01/22/2023: Pt. continues to benefit from education about adaptive strategies during ADLs, and IADLs. Eval: Pt. to be provided with adaptive strategies. Goal status: Revised   6.  Pt. will improve FOTO score by 2 points to  reflect patient perceived performance improvement assessment specific ADLs  and IADLs Baseline: 82/95/6213: FOTO score: 61 07/23/2023: FOTO score: 57 05/28/2023: FOTO score 59 05/26/2023: TBD 5/02/204:  TBD 03/03/23: FOTO 62 Eval: 57 Goal status: Ongoing  7.  Pt. will improve left hand coordination skills in order to be able to  manipulate small objects.     Baseline: 09/17/2023: Right: 26 sec., Left:  3 min, & 49 sec. pegs placed in 5 min.; Pt. Was able to remove 9 vertical pegs in 24 sec. 08/20/2023: Right: 27 sec., Left:  5 pegs placed in 5 min.; Pt. Was able to remove 9 vertical pegs in 28 sec. Pt. Is able to hold, and sort utensils in a drawer. Pt. Has difficulty manipulating small objects. 07/23/2023: Right: 33 sec., Left:  Pt. Is unable to grasp, and place pegs into the pegboard. Pt. Was able to remove 9 vertical pegs in 34 sec. 05/28/2023: Left:  5 min. 05/26/2023 Pt. Continues to present with difficulty handling and sorting utensils. 04/14/2023: 56 04/09/2023: TBD 03/03/23: 1 min. & 39 01/22/2022: Left: 3 min. & 4 sec. Eval: Pt. has difficulty sorting, and placing utensils with the left hand. Left FMC : >5 min. For 7 pegs on the 9 hole peg test.    Goal Status: Improving, Revised to be able to improve left hand FMC to be able to manipulate  small objects   8. Pt. will improve active left 2nd digit extension to be able able to isolate her 2nd digit in preparation for pressing/pushing buttons on appliances, phones, or remotes. Baseline: 09/17/2023: Pt. Presents with limited isolated 2nd digit extension with increased time. 08/20/2023: Pt. Is more consistently able to isolate 2nd digit extension with increased time. 07/23/2023: Pt. Continues to work on consistency with isolating left 2nd digit extension 05/28/2023: Pt. Continues to improve with left 2nd digit extension 05/26/2023: Improving with left 2nd digit extension. 04/09/2023: Pt. Is progressing with isolating left 2nd digit extension, however continues to  present with limited increased flexor tone. 03/03/23: Pt. Continues to work on improving consistency with 2nd digit extension to press the remote. 01/22/2023: is able to perform full digit extension, although 2nd digit is slow to extend s 2/2 flexor tone. Pt.  Eval: Pt. is able to is unable to  actively perform full digit extension Goal status: Ongoing  9: Pt. Will cut food with modified independence    Baseline: 09/17/2023: Pt. Is unable to cut the food on her plate, as well as cut food for meal preparation. 08/20/2023: Pt. Is unable to cut the food on her plate, as well as cut food for meal preparation.    Goal Status: Ongoing   ASSESSMENT:   CLINICAL IMPRESSION:  Pt. was able to tolerate ROM exercises without pain today. Pt. tolerated pinch strengthening, and [facility] skills well today. Pt. presented with shoulder discomfort initially, however improved with ROM. Pt required max cues for left hand position to facilitate long stroked for supination. Pt. Presented with difficulty performing translatory movements  moving them through her hand. Pt. continues to work on improving LUE ROM, strength, FMC, and hand function skills in order to work towards improving, and maximizing independence with ADLs, and IADL tasks.   PERFORMANCE DEFICITS in functional skills including ADLs, IADLs, coordination, proprioception, ROM, strength, FMC, and GMC, cognitive skills including memory, and psychosocial skills including coping strategies, environmental adaptation, interpersonal interactions, and routines and behaviors.    IMPAIRMENTS are limiting patient from ADLs, IADLs, education, leisure, and social participation.    COMORBIDITIES may have co-morbidities  that affects occupational performance. Patient will benefit from skilled OT to address above impairments and improve overall function.   MODIFICATION OR ASSISTANCE TO COMPLETE EVALUATION: Min-Moderate modification of tasks or assist with assess necessary to complete  an evaluation.   OT OCCUPATIONAL PROFILE AND HISTORY: Detailed assessment: Review of records and additional review of physical, cognitive, psychosocial history related to current functional performance.   CLINICAL DECISION MAKING: Moderate - several treatment options, min-mod task modification necessary   REHAB POTENTIAL: Good   EVALUATION COMPLEXITY: Moderate      PLAN: OT FREQUENCY: 2x/week   OT DURATION: 12 weeks   PLANNED INTERVENTIONS: self care/ADL training, therapeutic exercise, therapeutic activity, neuromuscular re-education, manual therapy, passive range of motion, functional mobility training, electrical stimulation, and paraffin   RECOMMENDED OTHER SERVICES: PT   CONSULTED AND AGREED WITH PLAN OF CARE: Patient and family member/caregiver   PLAN FOR NEXT SESSION: see above    Olegario Messier, MS, OTR/L  10/20/2023

## 2023-10-20 NOTE — Therapy (Unsigned)
OUTPATIENT PHYSICAL THERAPY TREATMENT/     Patient Name: [name] MRN: [id number] DOB:1941/04/21, 82 y.o., female Today's Date: 10/20/2023   PCP: Aram Beecham, D MD REFERRING PROVIDER: Jacquelynn Cree PA   END OF SESSION:  PT End of Session - 10/20/23 1635     Visit Number 12    Number of Visits 31    Date for PT Re-Evaluation 12/24/23    Authorization Type [employer]:    Authorization Time Period approved for 24 additional tx from 09/28/23 to 12/24/23; total visit count 31    Authorization - Number of Visits 24    Progress Note Due on Visit 10    PT Start Time 1616    PT Stop Time 1700    PT Time Calculation (min) 44 min    Equipment Utilized During Treatment Gait belt    Activity Tolerance Patient tolerated treatment well    Behavior During Therapy WFL for tasks assessed/performed                 Past Medical History:  Diagnosis Date   Anemia    Arthritis    Asthma    uses inhaler just prior to surgery to avoid attack   Back pain    from previous injury   Complication of anesthesia    has woken  up during 2 different surgery   Depression    no current issue/treatment; situation   Gallstones    GERD (gastroesophageal reflux disease)    Hiatal hernia    patient does NOT have nerve/muscle disease   History of kidney stones    HLD (hyperlipidemia)    HTN (hypertension)    Hypothyroidism    Kidney stones    Knee pain    Nausea and vomiting 10/15/2022   Non-diabetic pancreatic hormone dysfunction years   pt. 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   Pancreatitis    Pneumonia    Seizures (HCC)    caused by dye injected during a procedure   Shortness of breath    with exertion   Sinus problem    frequent infections/congestion   Stroke (HCC) 2021   reports having CVA in 2021 and having mini strokes before that   Thyroid disease    Past Surgical History:  Procedure Laterality Date   ABDOMINAL HYSTERECTOMY     APPENDECTOMY     CARPAL  TUNNEL RELEASE  10+ years ago   bilateral   EYE SURGERY  3 yrs ago   bilateral cataracts   FOOT OSTEOTOMY  6 weeks ago   Left foot: great, 2nd & 3rd   FOOT OSTEOTOMY  5 years ago   Right great toe   HAND SURGERY Bilateral 2011-most recent   multiple hand surgeries, 2 on left, 3 on right   KNEE ARTHROPLASTY Right 04/28/2022   Procedure: COMPUTER ASSISTED TOTAL KNEE ARTHROPLASTY;  Surgeon: Donato Heinz, MD;  Location: [facility];  Service: Orthopedics;  Laterality: Right;   LOOP RECORDER INSERTION N/A 05/16/2020   Procedure: LOOP RECORDER INSERTION;  Surgeon: Marcina Millard, MD;  Location: [facility];  Service: Cardiovascular;  Laterality: N/A;   NASAL SINUS SURGERY  most recent 7-8 yrs ago   7 sinus surgeries    TRIGGER FINGER RELEASE  11/19/2011   Procedure: RELEASE TRIGGER FINGER/A-1 PULLEY;  Surgeon: Nicki Reaper, MD;  Location: [facility];  Service: Orthopedics;  Laterality: Right;  release a-1 pulley right index finger and cyst removal  WRIST GANGLION EXCISION  1980's   right     ONSET DATE: 11/23/22  REFERRING DIAG: TIA  THERAPY DIAG:  Unsteadiness on feet  Other lack of coordination  Muscle weakness (generalized)  Difficulty in walking, not elsewhere classified  Rationale for Evaluation and Treatment: Rehabilitation  SUBJECTIVE:                                                                                                                                                                                             SUBJECTIVE STATEMENT:   Pt reports that she is doing well. No pain over the last few days and states that she is walking more at home without pain or dizziness.    Pt accompanied by: significant other  PERTINENT HISTORY: [name] is an 46 year-old female who was admitted to [facility] on 11/23/2022 with a cerebellar CVA. 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 Pt. Was admitted to Inpatient  Rehab from 12/21-1/02/2023. 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  Patient underwent inpatient rehabilitation for 2 weeks.  Patient was assessed and was scheduled for a knee replacement on 08/29/2022 however had to cancel it due to having had a CVA. PMH includes: anemia, arthritis, back pain, depression, hernia, GERD, HLD, HTN, hypothyroidism, knee pain, pancreatic hormone dysfunction, pneumonia, seizures, stroke, thyroid disease.  Patient has a donjoy brace for L knee.   PAIN:  Are you having pain? Yes, only with end range flexion and STS.    PRECAUTIONS: Fall, has latex allergy  WEIGHT BEARING RESTRICTIONS: No  FALLS: Has patient fallen in last 6 months? Yes. Number of falls 4 falls   LIVING ENVIRONMENT: Lives with: lives with their family and lives with their spouse Lives in: House/apartment Stairs: Yes: Internal: flight steps;   and External: 2 steps; on right going up and on left going up Has following equipment at home: Single point cane, Walker - 2 wheeled, Shower bench, and bed side commode  PLOF: Independent  PATIENT GOALS: to have less pain and move better  OBJECTIVE:   TODAY'S TREATMENT:  DATE: 10/20/23  Seated therex.  3# AW:  LAQ x 12  Hip flexion x 12  Hip flexion/abduction over hurdle x 12 bil  HS curl RTB x 12  Ankle DF/PF x 20    Gait with RW x 43ft +30ft with CGA and min cues for safety in turns and attention to task to improve control of RW and prevent lateral/posterior LOB   Standing therex UE supported on RW   Reciprocal march x 10 bil  Hip extension x 10 bil   Cues for decreased speed of movement throughout treatment to prevent use of momentum to achieve full ROM. Required multiple prolonged therapeutic rest breaks.   PATIENT EDUCATION: Education details: Pt educated  throughout session about proper posture and technique with exercises. Improved exercise technique, movement at target joints, use of target muscles after min to mod verbal, visual, tactile cues.   HOME EXERCISE PROGRAM:  Access Code: [email or web address] URL: [email or web address] Date: 10/07/2023 Prepared by: Grier Rocher  Exercises - Standing March with Counter Support  - 1 x daily - 3 x weekly - 2 sets - 10 reps - Standing Hip Abduction with Unilateral Counter Support  - 1 x daily - 3 x weekly - 2 sets - 10 reps - 2 hold - Standing Hip Extension with Unilateral Counter Support  - 1 x daily - 3 x weekly - 2 sets - 10 reps - 2 hold - Supine Straight Leg Raises  - 1 x daily - 37 x weekly - 2 sets - 10 reps - Bridge  - 1 x daily - 3 x weekly - 2 sets - 10 reps - 2 hold - Supine Heel Slide  - 1 x daily - 3 x weekly - 2 sets - 10 reps  GOALS: Goals reviewed with patient? Yes  SHORT TERM GOALS: Target date: 09/19/23  Patient will be independent in starter HEP to improve A/ROM tolerance and preserve available ROM of LLE and to advance A/ROM and strength of RLE.  Baseline: 08/20/23: defer to vists 2-3  Goal status: NEW  LONG TERM GOALS: Target date: 10/20/23  Patient will increase FOTO score to equal to or greater than 65%  to demonstrate statistically significant improvement in ease of mobility.  Baseline:  47%; 06/23/23: 60;   7/16: 60%   10/31: 54 Goal status: Progressing   2.  Patient (> 42 years old) will complete five times sit to stand test in < 15 seconds indicating an increased RLE power.  Baseline: 5/7: unable to tolerate ; 7/16: 17.4sec 10/31: 11.02 Goal status: Progressing   3.   Patient to report tolerance of ad lib AMB in home during the day of distances >19ft >5x at supervision level or better to improve independence with ADL.    Baseline: 08/20/23: using transport chair for nearly all mobility, not tolerating walking due to WB limitations of LLE  10/31: able to  ambulate 30-6ft with CGA from PT.  Goal status: Progressing     ASSESSMENT:  CLINICAL IMPRESSION:   Pt presented to PT session motivated to participate. Goal assessment performed on recent progress note. Refer to goal assessment for recent updates. No pain reported throughout. PT treatment focused BLE strengthening. Improved tolerance to standing on this day with cues for full ROM and decreased speed throughout therex. Pt able to perform gait with RW for up to 30ft on this day, still reporting no pain in standing or dizziness.  The patient will benefit from skilled physical therapy to reduce pain, improve  mobility, improve strength and improve quality of life.     OBJECTIVE IMPAIRMENTS: Abnormal gait, cardiopulmonary status limiting activity, decreased activity tolerance, decreased balance, decreased cognition, decreased endurance, decreased knowledge of use of DME, decreased mobility, difficulty walking, decreased ROM, decreased strength, decreased safety awareness, hypomobility, increased fascial restrictions, impaired perceived functional ability, impaired flexibility, impaired UE functional use, improper body mechanics, postural dysfunction, and pain.   ACTIVITY LIMITATIONS: carrying, lifting, bending, sitting, standing, squatting, stairs, transfers, bed mobility, bathing, toileting, dressing, reach over head, hygiene/grooming, locomotion level, and caring for others  PARTICIPATION LIMITATIONS: meal prep, cleaning, laundry, medication management, personal finances, interpersonal relationship, driving, shopping, community activity, yard work, school, and church  PERSONAL FACTORS: Age, Behavior pattern, Education, Fitness, Past/current experiences, Time since onset of injury/illness/exacerbation, and 3+ comorbidities: anemia, arthritis, back pain, depression, hernia, GERD, HLD, HTN, hypothyroidism, knee pain, pancreatic hormone dysfunction, pneumonia, seizures, stroke, thyroid disease  are also  affecting patient's functional outcome.   REHAB POTENTIAL: Fair    CLINICAL DECISION MAKING: Evolving/moderate complexity  EVALUATION COMPLEXITY: Moderate  PLAN:  PT FREQUENCY: 1-2x/week  PT DURATION: 10 weeks  PLANNED INTERVENTIONS: Therapeutic exercises, Therapeutic activity, Neuromuscular re-education, Balance training, Gait training, Patient/Family education, Self Care, Joint mobilization, Joint manipulation, Stair training, Vestibular training, Canalith repositioning, Visual/preceptual remediation/compensation, Orthotic/Fit training, DME instructions, Electrical stimulation, Wheelchair mobility training, Spinal mobilization, Cryotherapy, Moist heat, Compression bandaging, scar mobilization, Splintting, Taping, Traction, Ultrasound, Manual therapy, and Re-evaluation  PLAN FOR NEXT SESSION:  Tolerance to standing in functional movement/ tasks.  Continue BLE strengthening in weighted and unweighted position   Golden Pop, PT, DPT 10/20/2023, 5:12 PM   5:12 PM, 10/20/23  Physical Therapist - [facility]  [phone] [facility])

## 2023-10-22 ENCOUNTER — Ambulatory Visit: Payer: Medicare HMO | Admitting: Occupational Therapy

## 2023-10-22 ENCOUNTER — Ambulatory Visit: Payer: Medicare HMO | Admitting: Physical Therapy

## 2023-10-22 DIAGNOSIS — M6281 Muscle weakness (generalized): Secondary | ICD-10-CM

## 2023-10-22 DIAGNOSIS — R278 Other lack of coordination: Secondary | ICD-10-CM

## 2023-10-22 DIAGNOSIS — R2681 Unsteadiness on feet: Secondary | ICD-10-CM

## 2023-10-22 DIAGNOSIS — I6381 Other cerebral infarction due to occlusion or stenosis of small artery: Secondary | ICD-10-CM

## 2023-10-22 DIAGNOSIS — R531 Weakness: Secondary | ICD-10-CM | POA: Diagnosis not present

## 2023-10-22 DIAGNOSIS — R262 Difficulty in walking, not elsewhere classified: Secondary | ICD-10-CM | POA: Diagnosis not present

## 2023-10-22 NOTE — Therapy (Signed)
OUTPATIENT PHYSICAL THERAPY TREATMENT/     Patient Name: LINDSAY FREVERT MRN: [id number] DOB:10/23/1941, 82 y.o., female Today's Date: 10/22/2023   PCP: Aram Beecham, D MD REFERRING PROVIDER: Jacquelynn Cree PA   END OF SESSION:  PT End of Session - 10/22/23 1524     Visit Number 13    Number of Visits 31    Date for PT Re-Evaluation 12/24/23    Authorization Type [employer]:    Authorization Time Period approved for 24 additional tx from 09/28/23 to 12/24/23; total visit count 31    Authorization - Number of Visits 24    Progress Note Due on Visit 10    PT Start Time 1615    PT Stop Time 1700    PT Time Calculation (min) 45 min    Equipment Utilized During Treatment Gait belt    Activity Tolerance Patient tolerated treatment well    Behavior During Therapy WFL for tasks assessed/performed                 Past Medical History:  Diagnosis Date   Anemia    Arthritis    Asthma    uses inhaler just prior to surgery to avoid attack   Back pain    from previous injury   Complication of anesthesia    has woken  up during 2 different surgery   Depression    no current issue/treatment; situation   Gallstones    GERD (gastroesophageal reflux disease)    Hiatal hernia    patient does NOT have nerve/muscle disease   History of kidney stones    HLD (hyperlipidemia)    HTN (hypertension)    Hypothyroidism    Kidney stones    Knee pain    Nausea and vomiting 10/15/2022   Non-diabetic pancreatic hormone dysfunction years   pt. 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   Pancreatitis    Pneumonia    Seizures (HCC)    caused by dye injected during a procedure   Shortness of breath    with exertion   Sinus problem    frequent infections/congestion   Stroke (HCC) 2021   reports having CVA in 2021 and having mini strokes before that   Thyroid disease    Past Surgical History:  Procedure Laterality Date   ABDOMINAL HYSTERECTOMY     APPENDECTOMY     CARPAL  TUNNEL RELEASE  10+ years ago   bilateral   EYE SURGERY  3 yrs ago   bilateral cataracts   FOOT OSTEOTOMY  6 weeks ago   Left foot: great, 2nd & 3rd   FOOT OSTEOTOMY  5 years ago   Right great toe   HAND SURGERY Bilateral 2011-most recent   multiple hand surgeries, 2 on left, 3 on right   KNEE ARTHROPLASTY Right 04/28/2022   Procedure: COMPUTER ASSISTED TOTAL KNEE ARTHROPLASTY;  Surgeon: Donato Heinz, MD;  Location: [facility];  Service: Orthopedics;  Laterality: Right;   LOOP RECORDER INSERTION N/A 05/16/2020   Procedure: LOOP RECORDER INSERTION;  Surgeon: Marcina Millard, MD;  Location: [facility];  Service: Cardiovascular;  Laterality: N/A;   NASAL SINUS SURGERY  most recent 7-8 yrs ago   7 sinus surgeries    TRIGGER FINGER RELEASE  11/19/2011   Procedure: RELEASE TRIGGER FINGER/A-1 PULLEY;  Surgeon: Nicki Reaper, MD;  Location: [facility];  Service: Orthopedics;  Laterality: Right;  release a-1 pulley right index finger and cyst removal  WRIST GANGLION EXCISION  1980's   right     ONSET DATE: 11/23/22  REFERRING DIAG: TIA  THERAPY DIAG:  Unsteadiness on feet  Other lack of coordination  Muscle weakness (generalized)  Difficulty in walking, not elsewhere classified  Acute left-sided weakness  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HCC)  Rationale for Evaluation and Treatment: Rehabilitation  SUBJECTIVE:                                                                                                                                                                                             SUBJECTIVE STATEMENT:   Pt reports that she is doing well. Not  new updates   Pt accompanied by: significant other  PERTINENT HISTORY: Tywanda Aguillard is an 82 year-old female who was admitted to [facility] on 11/23/2022 with a cerebellar CVA. 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 Pt. Was admitted to Inpatient Rehab from 12/21-1/02/2023.  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  Patient underwent inpatient rehabilitation for 2 weeks.  Patient was assessed and was scheduled for a knee replacement on 08/29/2022 however had to cancel it due to having had a CVA. PMH includes: anemia, arthritis, back pain, depression, hernia, GERD, HLD, HTN, hypothyroidism, knee pain, pancreatic hormone dysfunction, pneumonia, seizures, stroke, thyroid disease.  Patient has a donjoy brace for L knee.   PAIN:  Are you having pain? Yes, only with end range flexion and STS.    PRECAUTIONS: Fall, has latex allergy  WEIGHT BEARING RESTRICTIONS: No  FALLS: Has patient fallen in last 6 months? Yes. Number of falls 4 falls   LIVING ENVIRONMENT: Lives with: lives with their family and lives with their spouse Lives in: House/apartment Stairs: Yes: Internal: flight steps;   and External: 2 steps; on right going up and on left going up Has following equipment at home: Single point cane, Walker - 2 wheeled, Shower bench, and bed side commode  PLOF: Independent  PATIENT GOALS: to have less pain and move better  OBJECTIVE:   TODAY'S TREATMENT:  DATE: 10/22/23  Nustep level 4 x 5 minutes BUE/BLE   Gait in parallel bars forward/reverse 4 laps x with mild cues for posture.  Sit<>stand x 10 without UE support   Standing on airex pad:  Static standing 3 x 30 sec.  PVC pip raise with shoulder height 3 x 10  Semitandem, 30 sec x 2 each.   Narrow stance 2 x 30 sec bil   Throughout session, Provided CGA for safety for all standing tasks with min cues for posture and symmetry of WB as tolereted through BLE.  Pt reports no pain in the L knee throughout session, as well as no dizziness or nausea while being in standing for up to 2 minutes.   PATIENT EDUCATION: Education details: Pt  educated throughout session about proper posture and technique with exercises. Improved exercise technique, movement at target joints, use of target muscles after min to mod verbal, visual, tactile cues.   HOME EXERCISE PROGRAM:  Access Code: [email or web address] URL: [email or web address] Date: 10/07/2023 Prepared by: Grier Rocher  Exercises - Standing March with Counter Support  - 1 x daily - 3 x weekly - 2 sets - 10 reps - Standing Hip Abduction with Unilateral Counter Support  - 1 x daily - 3 x weekly - 2 sets - 10 reps - 2 hold - Standing Hip Extension with Unilateral Counter Support  - 1 x daily - 3 x weekly - 2 sets - 10 reps - 2 hold - Supine Straight Leg Raises  - 1 x daily - 37 x weekly - 2 sets - 10 reps - Bridge  - 1 x daily - 3 x weekly - 2 sets - 10 reps - 2 hold - Supine Heel Slide  - 1 x daily - 3 x weekly - 2 sets - 10 reps  GOALS: Goals reviewed with patient? Yes  SHORT TERM GOALS: Target date: 09/19/23  Patient will be independent in starter HEP to improve A/ROM tolerance and preserve available ROM of LLE and to advance A/ROM and strength of RLE.  Baseline: 08/20/23: defer to vists 2-3  Goal status: NEW  LONG TERM GOALS: Target date: 10/20/23  Patient will increase FOTO score to equal to or greater than 65%  to demonstrate statistically significant improvement in ease of mobility.  Baseline:  47%; 06/23/23: 60;   7/16: 60%   10/31: 54 Goal status: Progressing   2.  Patient (> 61 years old) will complete five times sit to stand test in < 15 seconds indicating an increased RLE power.  Baseline: 5/7: unable to tolerate ; 7/16: 17.4sec 10/31: 11.02 Goal status: Progressing   3.   Patient to report tolerance of ad lib AMB in home during the day of distances >40ft >5x at supervision level or better to improve independence with ADL.    Baseline: 08/20/23: using transport chair for nearly all mobility, not tolerating walking due to WB limitations of LLE  10/31:  able to ambulate 30-57ft with CGA from PT.  Goal status: Progressing     ASSESSMENT:  CLINICAL IMPRESSION:   Pt presented to PT session motivated to participate. PT treatment focused on tolerance to standing and dynamic balance training. Pt able to tolerate up to 2 min in standing prior to feeling soreness in the L knee on this day and no dizziness or nausea. Husband reports they plan to follow-up with orthopedic surgeon some time in December to address severe L knee OA with TKA.  The patient will benefit from skilled  physical therapy to reduce pain, improve mobility, improve strength and improve quality of life.     OBJECTIVE IMPAIRMENTS: Abnormal gait, cardiopulmonary status limiting activity, decreased activity tolerance, decreased balance, decreased cognition, decreased endurance, decreased knowledge of use of DME, decreased mobility, difficulty walking, decreased ROM, decreased strength, decreased safety awareness, hypomobility, increased fascial restrictions, impaired perceived functional ability, impaired flexibility, impaired UE functional use, improper body mechanics, postural dysfunction, and pain.   ACTIVITY LIMITATIONS: carrying, lifting, bending, sitting, standing, squatting, stairs, transfers, bed mobility, bathing, toileting, dressing, reach over head, hygiene/grooming, locomotion level, and caring for others  PARTICIPATION LIMITATIONS: meal prep, cleaning, laundry, medication management, personal finances, interpersonal relationship, driving, shopping, community activity, yard work, school, and church  PERSONAL FACTORS: Age, Behavior pattern, Education, Fitness, Past/current experiences, Time since onset of injury/illness/exacerbation, and 3+ comorbidities: anemia, arthritis, back pain, depression, hernia, GERD, HLD, HTN, hypothyroidism, knee pain, pancreatic hormone dysfunction, pneumonia, seizures, stroke, thyroid disease  are also affecting patient's functional outcome.   REHAB  POTENTIAL: Fair    CLINICAL DECISION MAKING: Evolving/moderate complexity  EVALUATION COMPLEXITY: Moderate  PLAN:  PT FREQUENCY: 1-2x/week  PT DURATION: 10 weeks  PLANNED INTERVENTIONS: Therapeutic exercises, Therapeutic activity, Neuromuscular re-education, Balance training, Gait training, Patient/Family education, Self Care, Joint mobilization, Joint manipulation, Stair training, Vestibular training, Canalith repositioning, Visual/preceptual remediation/compensation, Orthotic/Fit training, DME instructions, Electrical stimulation, Wheelchair mobility training, Spinal mobilization, Cryotherapy, Moist heat, Compression bandaging, scar mobilization, Splintting, Taping, Traction, Ultrasound, Manual therapy, and Re-evaluation  PLAN FOR NEXT SESSION:  Tolerance to standing in functional movement/ tasks.  Continue BLE strengthening in weighted and unweighted position   Golden Pop, PT, DPT 10/22/2023, 3:25 PM   3:25 PM, 10/22/23  Physical Therapist - [facility]  [phone] [facility])

## 2023-10-22 NOTE — Therapy (Signed)
Occupational Therapy Neuro Treatment Note    Patient Name: [name] MRN: [id number] DOB:1941-04-25, 82 y.o., female Today's Date: 10/22/2023  PCP: Dr. Judithann Sheen REFERRING PROVIDER:  Dr. Judithann Sheen  END OF SESSION:  PLAN:   OT End of Session - 10/22/23 1652     Visit Number [id number]    Number of Visits 96    Date for OT Re-Evaluation 11/12/23    Authorization Time Period Progress report period starting 12/16/2022    OT Start Time 1534    OT Stop Time 1615    OT Time Calculation (min) 41 min    Activity Tolerance Patient tolerated treatment well    Behavior During Therapy WFL for tasks assessed/performed                   Past Medical History:  Diagnosis Date   Anemia    Arthritis    Asthma    uses inhaler just prior to surgery to avoid attack   Back pain    from previous injury   Complication of anesthesia    has woken  up during 2 different surgery   Depression    no current issue/treatment; situation   Gallstones    GERD (gastroesophageal reflux disease)    Hiatal hernia    patient does NOT have nerve/muscle disease   History of kidney stones    HLD (hyperlipidemia)    HTN (hypertension)    Hypothyroidism    Kidney stones    Knee pain    Nausea and vomiting 10/15/2022   Non-diabetic pancreatic hormone dysfunction years   pt. 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   Pancreatitis    Pneumonia    Seizures (HCC)    caused by dye injected during a procedure   Shortness of breath    with exertion   Sinus problem    frequent infections/congestion   Stroke (HCC) 2021   reports having CVA in 2021 and having mini strokes before that   Thyroid disease    Past Surgical History:  Procedure Laterality Date   ABDOMINAL HYSTERECTOMY     APPENDECTOMY     CARPAL TUNNEL RELEASE  10+ years ago   bilateral   EYE SURGERY  3 yrs ago   bilateral cataracts   FOOT OSTEOTOMY  6 weeks ago   Left foot: great, 2nd & 3rd   FOOT OSTEOTOMY  5 years ago   Right great toe    HAND SURGERY Bilateral 2011-most recent   multiple hand surgeries, 2 on left, 3 on right   KNEE ARTHROPLASTY Right 04/28/2022   Procedure: COMPUTER ASSISTED TOTAL KNEE ARTHROPLASTY;  Surgeon: Donato Heinz, MD;  Location: [facility];  Service: Orthopedics;  Laterality: Right;   LOOP RECORDER INSERTION N/A 05/16/2020   Procedure: LOOP RECORDER INSERTION;  Surgeon: Marcina Millard, MD;  Location: [facility];  Service: Cardiovascular;  Laterality: N/A;   NASAL SINUS SURGERY  most recent 7-8 yrs ago   7 sinus surgeries    TRIGGER FINGER RELEASE  11/19/2011   Procedure: RELEASE TRIGGER FINGER/A-1 PULLEY;  Surgeon: Nicki Reaper, MD;  Location:  SURGERY CENTER;  Service: Orthopedics;  Laterality: Right;  release a-1 pulley right index finger and cyst removal   WRIST GANGLION EXCISION  1980's   right   Patient Active Problem List   Diagnosis Date Noted   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 11/27/2022   Hypomagnesemia 11/27/2022   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  HLD (hyperlipidemia) 11/23/2022   Asthma 11/23/2022   Depression with anxiety 11/23/2022   Chronic diastolic CHF (congestive heart failure) (HCC) 11/23/2022   Normocytic anemia 11/23/2022   Aspiration pneumonia (HCC) 11/23/2022   AKI (acute kidney injury) (HCC) 11/23/2022   Abdominal pain 11/23/2022   Mesenteric mass 11/23/2022   Coffee ground emesis 11/23/2022   Nausea and vomiting 10/15/2022   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   Fatigue 09/17/2022   Left hemiparesis (HCC) 09/17/2022   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HCC) 08/22/2022   GERD (gastroesophageal reflux disease) 08/21/2022   Agitation 08/20/2022   Acute left-sided weakness 08/20/2022   Expressive aphasia    Stroke (HCC) 08/19/2022   Leukocytosis 08/19/2022   History of urticaria 04/28/2022   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   Primary osteoarthritis of left knee 02/24/2022   Primary osteoarthritis of right  knee 02/24/2022   Lumbar spondylolysis 04/12/2020   History of CVA (cerebrovascular accident) 03/26/2020   Low back pain radiating to right lower extremity 03/21/2020   B12 deficiency 03/06/2020   Positive anti-CCP test 12/21/2019   Arthralgia 12/13/2019   Dermatitis 12/13/2019   Rheumatoid factor positive 12/13/2019   Essential hypertension 12/11/2018   Palpitations 12/11/2018   Acquired hypothyroidism 11/10/2018   Arthritis of knee 09/17/2016   Anxiety 11/22/2014   Asthma without status asthmaticus 11/22/2014   Benign neoplasm of colon, unspecified 11/22/2014   Environmental allergies 11/22/2014   Hypertriglyceridemia 11/22/2014   Hypokalemia 11/22/2014   Personal history of disease of skin and subcutaneous tissue 11/22/2014   REFERRING DIAG: CVA   THERAPY DIAG:  Muscle weakness (generalized)   Other lack of coordination   Rationale for Evaluation and Treatment Rehabilitation   SUBJECTIVE:   SUBJECTIVE STATEMENT:  Pt. reports  feeling well today  Pt accompanied by: significant other   PERTINENT HISTORY: Patient is an 82 year-old female who was admitted to [facility] on 11/23/2022 with a cerebellar CVA. 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 Pt. Was admitted to Inpatient Rehab from 12/21-1/02/2023. 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  Patient underwent inpatient rehabilitation for 2 weeks.  Patient was assessed and was scheduled for a knee replacement on 08/29/2022 however had to cancel it due to having had a CVA.  Patient had a recent fall 2 days after discharging from inpatient rehab. Past Medical History includes: Knee replacement, essential HTN, hypokalemia, leukocytosis, seizures, positive anti-- CCP test, anxiety disorder, mini strokes.  Patient had shingles with left eye nerve pain s/p 1 year ago.    PRECAUTIONS: Fall   WEIGHT BEARING RESTRICTIONS No   PAIN:  Are you having  pain?  No pain.   FALLS: Has patient fallen in last 6 months? Yes. Number of falls 1   LIVING ENVIRONMENT: Lives with: Lives with Spouse Lives in: House/apartment Stairs: 2 storey home, resides on the first floor.  External: 2 stairs front no rails, and 6 in back with rails Has following equipment at home: Single point cane, Walker - 2 wheeled, Environmental consultant - 4 wheeled, Shower bench, and bed side commode   PLOF: Independent   PATIENT GOALS  To Regain the use of her left arm   OBJECTIVE:    HAND DOMINANCE: Right   ADLs: Overall ADLs: Husband assists pt. as needed Transfers/ambulation related to ADLs:Pt. Uses a 3 wheeled walker with Husband assist. Eating: Pt. Is independent with the right hand. Pt. has difficulty cutting food. Grooming: Pt. is using her right hand, however has  difficulty sustaining her LUE in elevation to assist with haircare. UB Dressing: Pt. Is independent donning a pullover shirt, and button down shirt. Has difficulty with buttoning, LB Dressing:  Independent donning pants, and socks. Difficulty tying shoes. Toileting: Independent Bathing: Pt. Is able to engage her right hand. Tub Shower transfers: Supervision Equipment: See above for equipment    IADLs: Shopping:  Has not had the opportunity for grocery shopping yet Light housekeeping: Husband is assisting with light house keeping Meal Prep:  Dependent Community mobility: Relies of family/friends Medication management: Husband assisting with weekly pillbox set-up, and administering medication. Financial management: TBD Handwriting: 75% legible   MOBILITY STATUS: Hx of falls   POSTURE COMMENTS:  No Significant postural limitations Sitting balance: supported sitting balance WFL   ACTIVITY TOLERANCE: Activity tolerance:  Fatigues in greater than 30 min.    FUNCTIONAL OUTCOME MEASURES: FOTO: 57   UPPER EXTREMITY ROM      Active ROM Right Eval: WFL Left eval Left  01/22/23 Left  03/03/23 Left  04/14/23  Left  05/28/2023 Left 07/23/2023 Left 08/20/2023 Left 09/17/23  Shoulder flexion   132 100 108 108 108 60(70) 70(78) 70(102)  Shoulder abduction   80 85 85 85 85 65(78) 72(85) 73(94)  Shoulder adduction             Shoulder extension             Shoulder internal rotation             Shoulder external rotation             Elbow flexion   140 140 WFL [facility] WFL  Elbow extension   WNL WNL [facility] WFL WFL  Wrist flexion   65 68        Wrist extension   -10 20 24  32 32 -10 20 20   Wrist ulnar deviation     12 10 14 14 10 18 18   Wrist radial deviation     8 14 14 16 12 14 14   Wrist pronation             Wrist supination             (Blank rows = not tested)   Left digit flexion to [facility]: 2nd: 0cm, 3rd: 0cm, 4th: 0cm, 5th: 0cm   Limited Left full 2nd digit extension     UPPER EXTREMITY MMT:      MMT Right Eval: 4+/5 overall Right 07/23/2023 Left Eval Left 01/22/23 Left 03/03/23 Left  04/14/2023 Left 05/28/2023 Left 07/23/2023 Left 08/20/2023 Left 09/17/23  Shoulder flexion   3+/5 3/5 3-/5 3-/5 3-/5 3-/5 2/5 2+/5 2+/5  Shoulder abduction   3+/5 3-/5 3-/5 3-/5 3-/5 3-/5 2/5 2+/5 2+/5  Shoulder adduction              Shoulder extension              Shoulder internal rotation              Shoulder external rotation              Middle trapezius              Lower trapezius              Elbow flexion   4/5 3+/5 4/5 N/A 4/5 4/5 3+/5 3+/5 3+/5  Elbow extension   4/5 3+/5 4/4 N/A 4/5 4/5 3+/5 3+/5 4-/5  Wrist flexion  Wrist extension   4/5 2-/5 3-/5 3-/5 3/5 3+/5 3-/5 3+/5 3+/5  Wrist ulnar deviation              Wrist radial deviation              Wrist pronation              Wrist supination              (Blank rows = not tested)   HAND FUNCTION: Grip strength: Right: 26#, Left: 10# Pinch strength: Right 8#, Left: 3#, 3 Pt. Pinch strength: Right: 9#, L: 2#  01/22/2023 Grip strength: Right: 26#, Left: 12# Pinch strength:  Pinch meter used at the initial  eval has been sent out for recalibration   03/03/2023 Grip strength: Right: 26#, Left: 13# Pinch strength:  Pinch meter used at the initial eval has been sent out for recalibration  04/14/2023: Grip strength: Right: 26#, Left: 13# Pinch strength:   Right 8#, Left: 4#, 3 Pt. Pinch strength: Right: 9#, L: 3#    05/28/2023: Grip strength: Right: 26#, Left: 13# Pinch strength:   Right 8#, Left: 2#, 3 Pt. Pinch strength: Right: 9#, L: 4#  07/23/2023  Grip strength: Right: 5#, Left: 2# Pinch strength:   Right 6#, Left: 2#, 3 Pt. Pinch strength: Right: 5#, L: 2#  08/20/2023:    Grip strength: Right: 8#, Left: 4# Pinch strength:   Right 6#, Left: 2#, 3 Pt. Pinch strength: Right: 5#, L: 2#    09/17/23:  Grip strength: Right: 5#, Left: 4# Pinch strength:   Right 6#, Left: 2#, 3 Pt. Pinch strength: Right: 5#, L: 2#   COORDINATION: Right: 22 sec., Left: <5 min. To place 7 pegs with increased compensation proximally in the trunk, and through reflexive associated reactions.  01/22/23 Right: 22 sec., Left: 3 min. & 4 sec.  03/03/23 Right: 22 sec., Left: 1 min. & 39 sec.  04/14/23   TBD  05/28/23 Right: 22 sec., Left:  5 min.  07/23/2023  Right: 33 sec., Left:  Pt. Is unable to grasp, and place pegs into the pegboard. Pt. Was able to remove 9 vertical pegs in 34 sec.  08/20/2023:  Right: 27 sec., Left:  5 pegs placed in 5 min.; Pt. Was able to remove 9 vertical pegs in 28 sec.  09/17/23:  Right: 26 sec., Left:  3 min, & 49 sec. pegs placed in 5 min.; Pt. Was able to remove 9 vertical pegs in 24 sec.         SENSATION: Light touch: WFL, proprioceptive awareness: Intact   EDEMA: N/A   MUSCLE TONE: LUE: Hypotonic   COGNITION: Overall cognitive status: WFL for tasks assessed. Pt. Is impulsive at times.   VISION: Subjective report: Pt. report having shingles affecting left eye  s/p 1 year. Has nerve pain Baseline vision: Wears glasses for reading only Visual history:  updated  see clinical impression   VISION ASSESSMENT:    WFL for tasks performed   PERCEPTION: Intact   PRAXIS: Impaired: Motor planning   OBSERVATIONS:  Pt. more alert, and engaging since prior to the most recent hospitalization.   TODAY'S TREATMENT:    There. Ex.:   Pt. tolerated PROM/AAROM to the Left shoulder, elbow, wrist, and digits. Pt. worked on [employer], and reciprocal motion using the UBE while seated for 8 min. with minimal resistance.  Constant monitoring was provided.   Neuromuscular re-education:  Pt. worked on grasping 1/2" flat marbles, and storing  them in the left hand. Pt. worked on translatory movements moving them through the palm of her hand to the tip of the 2nd digit, and thumb in preparation for placing them onto the tabletop top surface. Pt. worked on dropping them one at a time from the ulnar aspect of her left hand while simultaneously holding them in the radial aspect of the inner hand.        PATIENT EDUCATION: Education details: LUE therapeutic exercise Person educated: Patient and Spouse Education method: Medical illustrator Education comprehension: verbalized understanding, returned demonstration, and needs further education   HOME EXERCISE PROGRAM:   Reviewed activities at home to promote isolated 2nd digit extension.    GOALS: Goals reviewed with patient? Yes   SHORT TERM GOALS: Target date: 10/01/2023       1. Patient will be independent with home exercise program for the left upper extremity Baseline: 09/17/23: Independent 08/20/23: Pt. continues to require assist from he husband for HEPs 07/23/2023: Pt. continues to require assist. 05/28/2023: Pt. Requires assist 04/09/2023: Pt. Continues to consistently attempt to engage her hand at hand 02/01/2023: Pt. Consistently attempts to perform HEPs independently. No current home exercise program Goal status: Ongoing   LONG TERM GOALS: Target date:  08/20/2023   1.  Patient will improve  left shoulder strength by 2 mm grades to be able to sustain UE's in elevation long enough to wash her hair.  Baseline: 09/17/2023: Left shoulder 2+/5, abduction 2+/5 08/20/2023: Left shoulder flexion 2+/5, abduction 2+/5  07/23/2023: Left shoulder flexion: 2/5, abduction: 2/5. 05/28/2023: Left shoulder flexion: 3-/5, abduction: 3-/5. Pt. has an old left shoulder injury limiting progression with left shoulder strength 05/26/2023: Pt. continues to present with limitiations in sustaining LUE elevation long enough to perform hair care thoroughly 04/09/2023: Pt. is limited with sustaining LUE elevation long enough to perform hair care thoroughly. 03/03/2023: Left shoulder flexion: 3-/5, abduction: 3-/5 01/22/2023: Left shoulder flexion: 3-/5, abduction: 3-/5 Eval: Left shoulder flexion: 3/5, abduction: 3-/5 Goal status: Ongoing   2.  Patient will improve left shoulder active abduction to be able to comb her hair Baseline: 09/17/2023: 73(94) Pt. is able to reach her left side, and back of hair. 9/11/09/2023: 72(85) Pt. is able to reach her left side, and back of hair. 07/23/2023: Left shoulder flexion: 60(70) abduction: 65(78) 05/28/2023: left shoulder flexion: 108 abduction: 85 Pt. Has an old left shoulder injury limiting progression with left shoulder ROM 05/26/2023:  5/10 left shoulder pain with ROM limits using it functionally during hair care. 04/09/2023: Pt. Presents with difficulty abducting her left shoulder enough to thoroughly complete haircare. 03/03/2023: Shoulder abduction: 85 01/22/2023: Shoulder abduction: 85 Eval: Left shoulder abduction is 80(108) Goal status: Improving, Ongoing   3.  Patient will independently button her shirt with modified independence. Baseline:09/17/2023: Pt. Has a buttonhook, however does not know where it is. Pt. requires increased time to complete, and assist from her husband. 08/20/2023: Pt. requires increased time to complete, and assist from her husband. 07/23/2023 Pt. presents with  difficulty buttoning her shirt. 05/28/2023: pt. Continues to work towards progressing with buttoning 05/26/2023:  Pt. Continues to progress towards buttoning. 04/09/2023: Pt. Continues to progress towards buttoning. 03/03/2023: Pt. Continues to have difficulty with buttoning. 01/22/2023: Pt. continues to have difficulty. Eval: Patient has difficulty.  Goal status: Ongoing   4.  Patient well improve left grip strength in preparation for securely hold a glass/beverage Baseline: 09/17/2023: Grip strength: Right: 5#, Left: 4# Pt. Has difficulty holding a glass. Right:  8#, Left: 4# Pt. Is able to more securely hold mirrors, and flowers with the left hand, however has difficulty holding a glass beverage, or bottle. 08/20/2023: Right: 8#, Left: 4# Pt. Is able to more securely hold mirrors, and flowers with the left hand, however has difficulty holding a glass beverage, or bottle. 07/23/2023: Grip strength: Right: 5#, Left: 2# 05/28/2023: Right: 26#, Left: 13#  pt. Presents with difficulty securely holding flowers in her left hand. 05/26/2023: TBD 04/09/2023: Pt. Is able to hold, and hike pants with the left hand, continues to have difficulty with securely holding flowers.   03/03/2023: left grip strength: 13# 01/22/2023: Left: 12# Eval: Pt. Is unable to securely hold flowers. Goal status: Ongoing   5.  Pt. will independently recall adaptive  strategies for performing ADL/ADL kitchen tasks.  Baseline: 08/20/2023: Revised to include IADL kitchen tasks.  07/23/2023: Continue 05/28/2023: Pt. Needs continued education about adaptive strategies.05/26/2023: Continue 04/09/2023: Continue3/26/2024: Continue 01/22/2023: Pt. continues to benefit from education about adaptive strategies during ADLs, and IADLs. Eval: Pt. to be provided with adaptive strategies. Goal status: Revised   6.  Pt. will improve FOTO score by 2 points to reflect patient perceived performance improvement assessment specific ADLs  and IADLs Baseline: 84/69/6295: FOTO  score: 61 07/23/2023: FOTO score: 57 05/28/2023: FOTO score 59 05/26/2023: TBD 5/02/204:  TBD 03/03/23: FOTO 62 Eval: 57 Goal status: Ongoing  7.  Pt. will improve left hand coordination skills in order to be able to  manipulate small objects.     Baseline: 09/17/2023: Right: 26 sec., Left:  3 min, & 49 sec. pegs placed in 5 min.; Pt. Was able to remove 9 vertical pegs in 24 sec. 08/20/2023: Right: 27 sec., Left:  5 pegs placed in 5 min.; Pt. Was able to remove 9 vertical pegs in 28 sec. Pt. Is able to hold, and sort utensils in a drawer. Pt. Has difficulty manipulating small objects. 07/23/2023: Right: 33 sec., Left:  Pt. Is unable to grasp, and place pegs into the pegboard. Pt. Was able to remove 9 vertical pegs in 34 sec. 05/28/2023: Left:  5 min. 05/26/2023 Pt. Continues to present with difficulty handling and sorting utensils. 04/14/2023: 56 04/09/2023: TBD 03/03/23: 1 min. & 39 01/22/2022: Left: 3 min. & 4 sec. Eval: Pt. has difficulty sorting, and placing utensils with the left hand. Left FMC : >5 min. For 7 pegs on the 9 hole peg test.    Goal Status: Improving, Revised to be able to improve left hand FMC to be able to manipulate  small objects   8. Pt. will improve active left 2nd digit extension to be able able to isolate her 2nd digit in preparation for pressing/pushing buttons on appliances, phones, or remotes. Baseline: 09/17/2023: Pt. Presents with limited isolated 2nd digit extension with increased time. 08/20/2023: Pt. Is more consistently able to isolate 2nd digit extension with increased time. 07/23/2023: Pt. Continues to work on consistency with isolating left 2nd digit extension 05/28/2023: Pt. Continues to improve with left 2nd digit extension 05/26/2023: Improving with left 2nd digit extension. 04/09/2023: Pt. Is progressing with isolating left 2nd digit extension, however continues to present with limited increased flexor tone. 03/03/23: Pt. Continues to work on improving consistency with 2nd digit  extension to press the remote. 01/22/2023: is able to perform full digit extension, although 2nd digit is slow to extend s 2/2 flexor tone. Pt.  Eval: Pt. is able to is unable to actively perform full digit extension Goal status: Ongoing  9: Pt. Will cut food with modified independence    Baseline: 09/17/2023: Pt. Is unable to cut the food on her plate, as well as cut food for meal preparation. 08/20/2023: Pt. Is unable to cut the food on her plate, as well as cut food for meal preparation.    Goal Status: Ongoing   ASSESSMENT:   CLINICAL IMPRESSION:  Pt. was able to tolerate all ROM exercises, as well as increased resistance on the UBE without pain today. Pt. requires cues, and assist for controlled dropping of pegs from the ulnar aspect of her palm. Pt. presented with difficulty performing translatory movements moving them through her hand dropping multiple pegs from the tips of her fingers. Pt. continues to work on improving LUE ROM, strength, FMC, and hand function skills in order to work towards improving, and maximizing independence with ADLs, and IADL tasks.   PERFORMANCE DEFICITS in functional skills including ADLs, IADLs, coordination, proprioception, ROM, strength, FMC, and GMC, cognitive skills including memory, and psychosocial skills including coping strategies, environmental adaptation, interpersonal interactions, and routines and behaviors.    IMPAIRMENTS are limiting patient from ADLs, IADLs, education, leisure, and social participation.    COMORBIDITIES may have co-morbidities  that affects occupational performance. Patient will benefit from skilled OT to address above impairments and improve overall function.   MODIFICATION OR ASSISTANCE TO COMPLETE EVALUATION: Min-Moderate modification of tasks or assist with assess necessary to complete an evaluation.   OT OCCUPATIONAL PROFILE AND HISTORY: Detailed assessment: Review of records and additional review of physical, cognitive,  psychosocial history related to current functional performance.   CLINICAL DECISION MAKING: Moderate - several treatment options, min-mod task modification necessary   REHAB POTENTIAL: Good   EVALUATION COMPLEXITY: Moderate      PLAN: OT FREQUENCY: 2x/week   OT DURATION: 12 weeks   PLANNED INTERVENTIONS: self care/ADL training, therapeutic exercise, therapeutic activity, neuromuscular re-education, manual therapy, passive range of motion, functional mobility training, electrical stimulation, and paraffin   RECOMMENDED OTHER SERVICES: PT   CONSULTED AND AGREED WITH PLAN OF CARE: Patient and family member/caregiver   PLAN FOR NEXT SESSION: see above    Olegario Messier, MS, OTR/L  10/22/2023

## 2023-10-27 ENCOUNTER — Ambulatory Visit: Payer: Medicare HMO | Admitting: Occupational Therapy

## 2023-10-27 ENCOUNTER — Ambulatory Visit: Payer: Medicare HMO | Admitting: Physical Therapy

## 2023-10-27 DIAGNOSIS — R262 Difficulty in walking, not elsewhere classified: Secondary | ICD-10-CM

## 2023-10-27 DIAGNOSIS — R278 Other lack of coordination: Secondary | ICD-10-CM | POA: Diagnosis not present

## 2023-10-27 DIAGNOSIS — R2681 Unsteadiness on feet: Secondary | ICD-10-CM | POA: Diagnosis not present

## 2023-10-27 DIAGNOSIS — I6381 Other cerebral infarction due to occlusion or stenosis of small artery: Secondary | ICD-10-CM | POA: Diagnosis not present

## 2023-10-27 DIAGNOSIS — M6281 Muscle weakness (generalized): Secondary | ICD-10-CM

## 2023-10-27 DIAGNOSIS — R531 Weakness: Secondary | ICD-10-CM | POA: Diagnosis not present

## 2023-10-27 NOTE — Therapy (Unsigned)
OUTPATIENT PHYSICAL THERAPY TREATMENT/     Patient Name: [name] MRN: [id number] DOB:1941-05-14, 82 y.o., female Today's Date: 10/27/2023   PCP: Aram Beecham, D MD REFERRING PROVIDER: Jacquelynn Cree PA   END OF SESSION:  PT End of Session - 10/27/23 1617     Visit Number 14    Number of Visits 31    Date for PT Re-Evaluation 12/24/23    Authorization Type [employer]:    Authorization Time Period approved for 24 additional tx from 09/28/23 to 12/24/23; total visit count 31    Authorization - Number of Visits 24    Progress Note Due on Visit 10    PT Start Time 1617    PT Stop Time 1657    PT Time Calculation (min) 40 min    Equipment Utilized During Treatment Gait belt    Activity Tolerance Patient tolerated treatment well    Behavior During Therapy WFL for tasks assessed/performed                 Past Medical History:  Diagnosis Date   Anemia    Arthritis    Asthma    uses inhaler just prior to surgery to avoid attack   Back pain    from previous injury   Complication of anesthesia    has woken  up during 2 different surgery   Depression    no current issue/treatment; situation   Gallstones    GERD (gastroesophageal reflux disease)    Hiatal hernia    patient does NOT have nerve/muscle disease   History of kidney stones    HLD (hyperlipidemia)    HTN (hypertension)    Hypothyroidism    Kidney stones    Knee pain    Nausea and vomiting 10/15/2022   Non-diabetic pancreatic hormone dysfunction years   pt. 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   Pancreatitis    Pneumonia    Seizures (HCC)    caused by dye injected during a procedure   Shortness of breath    with exertion   Sinus problem    frequent infections/congestion   Stroke (HCC) 2021   reports having CVA in 2021 and having mini strokes before that   Thyroid disease    Past Surgical History:  Procedure Laterality Date   ABDOMINAL HYSTERECTOMY     APPENDECTOMY     CARPAL  TUNNEL RELEASE  10+ years ago   bilateral   EYE SURGERY  3 yrs ago   bilateral cataracts   FOOT OSTEOTOMY  6 weeks ago   Left foot: great, 2nd & 3rd   FOOT OSTEOTOMY  5 years ago   Right great toe   HAND SURGERY Bilateral 2011-most recent   multiple hand surgeries, 2 on left, 3 on right   KNEE ARTHROPLASTY Right 04/28/2022   Procedure: COMPUTER ASSISTED TOTAL KNEE ARTHROPLASTY;  Surgeon: Donato Heinz, MD;  Location: [facility];  Service: Orthopedics;  Laterality: Right;   LOOP RECORDER INSERTION N/A 05/16/2020   Procedure: LOOP RECORDER INSERTION;  Surgeon: Marcina Millard, MD;  Location: [facility];  Service: Cardiovascular;  Laterality: N/A;   NASAL SINUS SURGERY  most recent 7-8 yrs ago   7 sinus surgeries    TRIGGER FINGER RELEASE  11/19/2011   Procedure: RELEASE TRIGGER FINGER/A-1 PULLEY;  Surgeon: Nicki Reaper, MD;  Location: [facility];  Service: Orthopedics;  Laterality: Right;  release a-1 pulley right index finger and cyst removal  WRIST GANGLION EXCISION  1980's   right     ONSET DATE: 11/23/22  REFERRING DIAG: TIA  THERAPY DIAG:  Unsteadiness on feet  Other lack of coordination  Muscle weakness (generalized)  Difficulty in walking, not elsewhere classified  Rationale for Evaluation and Treatment: Rehabilitation  SUBJECTIVE:                                                                                                                                                                                             SUBJECTIVE STATEMENT:   Pt reports that she is doing well. Not  new updates   Pt accompanied by: significant other  PERTINENT HISTORY: Georgena Allan is an 82 year-old female who was admitted to [facility] on 11/23/2022 with a cerebellar CVA. 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 Pt. Was admitted to Inpatient Rehab from 12/21-1/02/2023. 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  Patient underwent inpatient rehabilitation for 2 weeks.  Patient was assessed and was scheduled for a knee replacement on 08/29/2022 however had to cancel it due to having had a CVA. PMH includes: anemia, arthritis, back pain, depression, hernia, GERD, HLD, HTN, hypothyroidism, knee pain, pancreatic hormone dysfunction, pneumonia, seizures, stroke, thyroid disease.  Patient has a donjoy brace for L knee.   PAIN:  Are you having pain? no  PRECAUTIONS: Fall, has latex allergy  WEIGHT BEARING RESTRICTIONS: No  FALLS: Has patient fallen in last 6 months? Yes. Number of falls 4 falls   LIVING ENVIRONMENT: Lives with: lives with their family and lives with their spouse Lives in: House/apartment Stairs: Yes: Internal: flight steps;   and External: 2 steps; on right going up and on left going up Has following equipment at home: Single point cane, Walker - 2 wheeled, Shower bench, and bed side commode  PLOF: Independent  PATIENT GOALS: to have less pain and move better  OBJECTIVE:   TODAY'S TREATMENT:  DATE: 10/27/23  Nustep level 2 x 6 minutes BUE/BLE   Sit<>stand with RW 3 x 8   Gait with RW 2 x 36ft with CGA from PT and min cues for attention to the RLE  Side stepping with RW 62ft x 3 bil with therapeutic rest breaks between bouts.   Foot tap on 4inch step 3 x 8 bil with therapeutic rest breaks between  bouts.   Throughout session, Provided CGA for safety for all standing tasks with min cues for posture and symmetry of WB as tolereted through BLE.  Pt reports no pain in the L knee throughout session, as well as no dizziness or nausea while being in standing   PATIENT EDUCATION: Education details: Pt educated throughout session about proper posture and technique with exercises. Improved exercise technique, movement at target  joints, use of target muscles after min to mod verbal, visual, tactile cues.   HOME EXERCISE PROGRAM:  Access Code: [email or web address] URL: [email or web address] Date: 10/07/2023 Prepared by: Grier Rocher  Exercises - Standing March with Counter Support  - 1 x daily - 3 x weekly - 2 sets - 10 reps - Standing Hip Abduction with Unilateral Counter Support  - 1 x daily - 3 x weekly - 2 sets - 10 reps - 2 hold - Standing Hip Extension with Unilateral Counter Support  - 1 x daily - 3 x weekly - 2 sets - 10 reps - 2 hold - Supine Straight Leg Raises  - 1 x daily - 37 x weekly - 2 sets - 10 reps - Bridge  - 1 x daily - 3 x weekly - 2 sets - 10 reps - 2 hold - Supine Heel Slide  - 1 x daily - 3 x weekly - 2 sets - 10 reps  GOALS: Goals reviewed with patient? Yes  SHORT TERM GOALS: Target date: 09/19/23  Patient will be independent in starter HEP to improve A/ROM tolerance and preserve available ROM of LLE and to advance A/ROM and strength of RLE.  Baseline: 08/20/23: defer to vists 2-3  Goal status: NEW  LONG TERM GOALS: Target date: 10/20/23  Patient will increase FOTO score to equal to or greater than 65%  to demonstrate statistically significant improvement in ease of mobility.  Baseline:  47%; 06/23/23: 60;   7/16: 60%   10/31: 54 Goal status: Progressing   2.  Patient (> 7 years old) will complete five times sit to stand test in < 15 seconds indicating an increased RLE power.  Baseline: 5/7: unable to tolerate ; 7/16: 17.4sec 10/31: 11.02 Goal status: Progressing   3.   Patient to report tolerance of ad lib AMB in home during the day of distances >86ft >5x at supervision level or better to improve independence with ADL.    Baseline: 08/20/23: using transport chair for nearly all mobility, not tolerating walking due to WB limitations of LLE  10/31: able to ambulate 30-24ft with CGA from PT.  Goal status: Progressing     ASSESSMENT:  CLINICAL IMPRESSION:   Pt presented  to PT session motivated to participate. PT treatment focused on tolerance to standing and dynamic balance training. Pt tolerates increased distance of gait training with CGA and improved safety in turns R and L with RW. Pt reports no pain throughout session with dynamic movement in all planes in increased time in single limb stance with foot tap on 4inch step.  The patient will benefit from skilled physical therapy to reduce pain, improve mobility, improve  strength and improve quality of life.     OBJECTIVE IMPAIRMENTS: Abnormal gait, cardiopulmonary status limiting activity, decreased activity tolerance, decreased balance, decreased cognition, decreased endurance, decreased knowledge of use of DME, decreased mobility, difficulty walking, decreased ROM, decreased strength, decreased safety awareness, hypomobility, increased fascial restrictions, impaired perceived functional ability, impaired flexibility, impaired UE functional use, improper body mechanics, postural dysfunction, and pain.   ACTIVITY LIMITATIONS: carrying, lifting, bending, sitting, standing, squatting, stairs, transfers, bed mobility, bathing, toileting, dressing, reach over head, hygiene/grooming, locomotion level, and caring for others  PARTICIPATION LIMITATIONS: meal prep, cleaning, laundry, medication management, personal finances, interpersonal relationship, driving, shopping, community activity, yard work, school, and church  PERSONAL FACTORS: Age, Behavior pattern, Education, Fitness, Past/current experiences, Time since onset of injury/illness/exacerbation, and 3+ comorbidities: anemia, arthritis, back pain, depression, hernia, GERD, HLD, HTN, hypothyroidism, knee pain, pancreatic hormone dysfunction, pneumonia, seizures, stroke, thyroid disease  are also affecting patient's functional outcome.   REHAB POTENTIAL: Fair    CLINICAL DECISION MAKING: Evolving/moderate complexity  EVALUATION COMPLEXITY: Moderate  PLAN:  PT  FREQUENCY: 1-2x/week  PT DURATION: 10 weeks  PLANNED INTERVENTIONS: Therapeutic exercises, Therapeutic activity, Neuromuscular re-education, Balance training, Gait training, Patient/Family education, Self Care, Joint mobilization, Joint manipulation, Stair training, Vestibular training, Canalith repositioning, Visual/preceptual remediation/compensation, Orthotic/Fit training, DME instructions, Electrical stimulation, Wheelchair mobility training, Spinal mobilization, Cryotherapy, Moist heat, Compression bandaging, scar mobilization, Splintting, Taping, Traction, Ultrasound, Manual therapy, and Re-evaluation  PLAN FOR NEXT SESSION:  Tolerance to standing in functional movement/ tasks.  Continue BLE strengthening in weighted and unweighted position   Golden Pop, PT, DPT 10/27/2023, 5:27 PM   5:27 PM, 10/27/23  Physical Therapist - [facility]  [phone] [facility])

## 2023-10-28 NOTE — Therapy (Addendum)
Occupational Therapy Progress Note  Dates of reporting period  09/17/2023   to   10/27/2023     Patient Name: [name] MRN: [id number] DOB:11/06/1941, 82 y.o., female Today's Date: 10/28/2023  PCP: Dr. Judithann Sheen REFERRING PROVIDER:  Dr. Judithann Sheen  END OF SESSION:  PLAN:   OT End of Session - 10/28/23 0837     Visit Number 70    Number of Visits 96    Date for OT Re-Evaluation 11/12/23    Authorization Time Period Progress report period starting 12/16/2022    OT Start Time 1530    OT Stop Time 1615    OT Time Calculation (min) 45 min    Activity Tolerance Patient tolerated treatment well    Behavior During Therapy WFL for tasks assessed/performed                   Past Medical History:  Diagnosis Date   Anemia    Arthritis    Asthma    uses inhaler just prior to surgery to avoid attack   Back pain    from previous injury   Complication of anesthesia    has woken  up during 2 different surgery   Depression    no current issue/treatment; situation   Gallstones    GERD (gastroesophageal reflux disease)    Hiatal hernia    patient does NOT have nerve/muscle disease   History of kidney stones    HLD (hyperlipidemia)    HTN (hypertension)    Hypothyroidism    Kidney stones    Knee pain    Nausea and vomiting 10/15/2022   Non-diabetic pancreatic hormone dysfunction years   pt. 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   Pancreatitis    Pneumonia    Seizures (HCC)    caused by dye injected during a procedure   Shortness of breath    with exertion   Sinus problem    frequent infections/congestion   Stroke (HCC) 2021   reports having CVA in 2021 and having mini strokes before that   Thyroid disease    Past Surgical History:  Procedure Laterality Date   ABDOMINAL HYSTERECTOMY     APPENDECTOMY     CARPAL TUNNEL RELEASE  10+ years ago   bilateral   EYE SURGERY  3 yrs ago   bilateral cataracts   FOOT OSTEOTOMY  6 weeks ago   Left foot: great, 2nd & 3rd    FOOT OSTEOTOMY  5 years ago   Right great toe   HAND SURGERY Bilateral 2011-most recent   multiple hand surgeries, 2 on left, 3 on right   KNEE ARTHROPLASTY Right 04/28/2022   Procedure: COMPUTER ASSISTED TOTAL KNEE ARTHROPLASTY;  Surgeon: Donato Heinz, MD;  Location: [facility];  Service: Orthopedics;  Laterality: Right;   LOOP RECORDER INSERTION N/A 05/16/2020   Procedure: LOOP RECORDER INSERTION;  Surgeon: Marcina Millard, MD;  Location: [facility];  Service: Cardiovascular;  Laterality: N/A;   NASAL SINUS SURGERY  most recent 7-8 yrs ago   7 sinus surgeries    TRIGGER FINGER RELEASE  11/19/2011   Procedure: RELEASE TRIGGER FINGER/A-1 PULLEY;  Surgeon: Nicki Reaper, MD;  Location: [facility];  Service: Orthopedics;  Laterality: Right;  release a-1 pulley right index finger and cyst removal   WRIST GANGLION EXCISION  1980's   right   Patient Active Problem List   Diagnosis Date Noted   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 11/27/2022  Hypomagnesemia 11/27/2022   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   HLD (hyperlipidemia) 11/23/2022   Asthma 11/23/2022   Depression with anxiety 11/23/2022   Chronic diastolic CHF (congestive heart failure) (HCC) 11/23/2022   Normocytic anemia 11/23/2022   Aspiration pneumonia (HCC) 11/23/2022   AKI (acute kidney injury) (HCC) 11/23/2022   Abdominal pain 11/23/2022   Mesenteric mass 11/23/2022   Coffee ground emesis 11/23/2022   Nausea and vomiting 10/15/2022   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   Fatigue 09/17/2022   Left hemiparesis (HCC) 09/17/2022   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HCC) 08/22/2022   GERD (gastroesophageal reflux disease) 08/21/2022   Agitation 08/20/2022   Acute left-sided weakness 08/20/2022   Expressive aphasia    Stroke (HCC) 08/19/2022   Leukocytosis 08/19/2022   History of urticaria 04/28/2022   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   Primary osteoarthritis of  left knee 02/24/2022   Primary osteoarthritis of right knee 02/24/2022   Lumbar spondylolysis 04/12/2020   History of CVA (cerebrovascular accident) 03/26/2020   Low back pain radiating to right lower extremity 03/21/2020   B12 deficiency 03/06/2020   Positive anti-CCP test 12/21/2019   Arthralgia 12/13/2019   Dermatitis 12/13/2019   Rheumatoid factor positive 12/13/2019   Essential hypertension 12/11/2018   Palpitations 12/11/2018   Acquired hypothyroidism 11/10/2018   Arthritis of knee 09/17/2016   Anxiety 11/22/2014   Asthma without status asthmaticus 11/22/2014   Benign neoplasm of colon, unspecified 11/22/2014   Environmental allergies 11/22/2014   Hypertriglyceridemia 11/22/2014   Hypokalemia 11/22/2014   Personal history of disease of skin and subcutaneous tissue 11/22/2014   REFERRING DIAG: CVA   THERAPY DIAG:  Muscle weakness (generalized)   Other lack of coordination   Rationale for Evaluation and Treatment Rehabilitation   SUBJECTIVE:   SUBJECTIVE STATEMENT:  Pt. reports  feeling well today  Pt accompanied by: significant other   PERTINENT HISTORY: Patient is an 82 year-old female who was admitted to [facility] on 11/23/2022 with a cerebellar CVA. 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 Pt. Was admitted to Inpatient Rehab from 12/21-1/02/2023. 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  Patient underwent inpatient rehabilitation for 2 weeks.  Patient was assessed and was scheduled for a knee replacement on 08/29/2022 however had to cancel it due to having had a CVA.  Patient had a recent fall 2 days after discharging from inpatient rehab. Past Medical History includes: Knee replacement, essential HTN, hypokalemia, leukocytosis, seizures, positive anti-- CCP test, anxiety disorder, mini strokes.  Patient had shingles with left eye nerve pain s/p 1 year ago.    PRECAUTIONS: Fall    WEIGHT BEARING RESTRICTIONS No   PAIN:  Are you having pain?  No pain.   FALLS: Has patient fallen in last 6 months? Yes. Number of falls 1   LIVING ENVIRONMENT: Lives with: Lives with Spouse Lives in: House/apartment Stairs: 2 storey home, resides on the first floor.  External: 2 stairs front no rails, and 6 in back with rails Has following equipment at home: Single point cane, Walker - 2 wheeled, Environmental consultant - 4 wheeled, Shower bench, and bed side commode   PLOF: Independent   PATIENT GOALS  To Regain the use of her left arm   OBJECTIVE:    HAND DOMINANCE: Right   ADLs: Overall ADLs: Husband assists pt. as needed Transfers/ambulation related to ADLs:Pt. Uses a 3 wheeled walker with Husband assist. Eating: Pt. Is independent with the right hand. Pt. has  difficulty cutting food. Grooming: Pt. is using her right hand, however has difficulty sustaining her LUE in elevation to assist with haircare. UB Dressing: Pt. Is independent donning a pullover shirt, and button down shirt. Has difficulty with buttoning, LB Dressing:  Independent donning pants, and socks. Difficulty tying shoes. Toileting: Independent Bathing: Pt. Is able to engage her right hand. Tub Shower transfers: Supervision Equipment: See above for equipment    IADLs: Shopping:  Has not had the opportunity for grocery shopping yet Light housekeeping: Husband is assisting with light house keeping Meal Prep:  Dependent Community mobility: Relies of family/friends Medication management: Husband assisting with weekly pillbox set-up, and administering medication. Financial management: TBD Handwriting: 75% legible   MOBILITY STATUS: Hx of falls   POSTURE COMMENTS:  No Significant postural limitations Sitting balance: supported sitting balance WFL   ACTIVITY TOLERANCE: Activity tolerance:  Fatigues in greater than 30 min.    FUNCTIONAL OUTCOME MEASURES: FOTO: 57   UPPER EXTREMITY ROM      Active ROM Right Eval:  WFL Left eval Left  01/22/23 Left  03/03/23 Left  04/14/23 Left  05/28/2023 Left 07/23/2023 Left 08/20/2023 Left 09/17/23  Shoulder flexion   132 100 108 108 108 60(70) 70(78) 70(102)  Shoulder abduction   80 85 85 85 85 65(78) 72(85) 73(94)  Shoulder adduction             Shoulder extension             Shoulder internal rotation             Shoulder external rotation             Elbow flexion   140 140 WFL [facility] WFL  Elbow extension   WNL WNL [facility] WFL WFL  Wrist flexion   65 68        Wrist extension   -10 20 24  32 32 -10 20 20   Wrist ulnar deviation     12 10 14 14 10 18 18   Wrist radial deviation     8 14 14 16 12 14 14   Wrist pronation             Wrist supination             (Blank rows = not tested)   Left digit flexion to [facility]: 2nd: 0cm, 3rd: 0cm, 4th: 0cm, 5th: 0cm   Limited Left full 2nd digit extension     UPPER EXTREMITY MMT:      MMT Right Eval: 4+/5 overall Right 07/23/2023 Left Eval Left 01/22/23 Left 03/03/23 Left  04/14/2023 Left 05/28/2023 Left 07/23/2023 Left 08/20/2023 Left 09/17/23  Shoulder flexion   3+/5 3/5 3-/5 3-/5 3-/5 3-/5 2/5 2+/5 2+/5  Shoulder abduction   3+/5 3-/5 3-/5 3-/5 3-/5 3-/5 2/5 2+/5 2+/5  Shoulder adduction              Shoulder extension              Shoulder internal rotation              Shoulder external rotation              Middle trapezius              Lower trapezius              Elbow flexion   4/5 3+/5 4/5 N/A 4/5 4/5 3+/5 3+/5 3+/5  Elbow extension   4/5 3+/5 4/4  N/A 4/5 4/5 3+/5 3+/5 4-/5  Wrist flexion              Wrist extension   4/5 2-/5 3-/5 3-/5 3/5 3+/5 3-/5 3+/5 3+/5  Wrist ulnar deviation              Wrist radial deviation              Wrist pronation              Wrist supination              (Blank rows = not tested)   HAND FUNCTION: Grip strength: Right: 26#, Left: 10# Pinch strength: Right 8#, Left: 3#, 3 Pt. Pinch strength: Right: 9#, L: 2#  01/22/2023 Grip strength: Right: 26#,  Left: 12# Pinch strength:  Pinch meter used at the initial eval has been sent out for recalibration   03/03/2023 Grip strength: Right: 26#, Left: 13# Pinch strength:  Pinch meter used at the initial eval has been sent out for recalibration  04/14/2023: Grip strength: Right: 26#, Left: 13# Pinch strength:   Right 8#, Left: 4#, 3 Pt. Pinch strength: Right: 9#, L: 3#    05/28/2023: Grip strength: Right: 26#, Left: 13# Pinch strength:   Right 8#, Left: 2#, 3 Pt. Pinch strength: Right: 9#, L: 4#  07/23/2023  Grip strength: Right: 5#, Left: 2# Pinch strength:   Right 6#, Left: 2#, 3 Pt. Pinch strength: Right: 5#, L: 2#  08/20/2023:    Grip strength: Right: 8#, Left: 4# Pinch strength:   Right 6#, Left: 2#, 3 Pt. Pinch strength: Right: 5#, L: 2#    09/17/23:  Grip strength: Right: 5#, Left: 4# Pinch strength:   Right 6#, Left: 2#, 3 Pt. Pinch strength: Right: 5#, L: 2#   COORDINATION: Right: 22 sec., Left: <5 min. To place 7 pegs with increased compensation proximally in the trunk, and through reflexive associated reactions.  01/22/23 Right: 22 sec., Left: 3 min. & 4 sec.  03/03/23 Right: 22 sec., Left: 1 min. & 39 sec.  04/14/23   TBD  05/28/23 Right: 22 sec., Left:  5 min.  07/23/2023  Right: 33 sec., Left:  Pt. Is unable to grasp, and place pegs into the pegboard. Pt. Was able to remove 9 vertical pegs in 34 sec.  08/20/2023:  Right: 27 sec., Left:  5 pegs placed in 5 min.; Pt. Was able to remove 9 vertical pegs in 28 sec.  09/17/23:  Right: 26 sec., Left:  3 min, & 49 sec. pegs placed in 5 min.; Pt. Was able to remove 9 vertical pegs in 24 sec.         SENSATION: Light touch: WFL, proprioceptive awareness: Intact   EDEMA: N/A   MUSCLE TONE: LUE: Hypotonic   COGNITION: Overall cognitive status: WFL for tasks assessed. Pt. Is impulsive at times.   VISION: Subjective report: Pt. report having shingles affecting left eye  s/p 1 year. Has nerve pain Baseline  vision: Wears glasses for reading only Visual history:  updated see clinical impression   VISION ASSESSMENT:    WFL for tasks performed   PERCEPTION: Intact   PRAXIS: Impaired: Motor planning   OBSERVATIONS:  Pt. more alert, and engaging since prior to the most recent hospitalization.   TODAY'S TREATMENT:    There. Ex.:   Pt. tolerated PROM/AAROM to the Left shoulder, elbow, wrist, and digits. Pt. worked on [employer], and reciprocal motion using the UBE while seated for 8 min.  with minimal resistance.  Constant monitoring was provided.   Neuromuscular re-education:  Pt. worked on [facility] skills grasping 1" sticks. Pt. worked on storing the objects in the palm, and translatory skills moving the items from the palm of the hand to the tip of the 2nd digit, and thumb. Pt. worked on removing the pegs using bilateral alternating hand patterns.         PATIENT EDUCATION: Education details: LUE therapeutic exercise Person educated: Patient and Spouse Education method: Medical illustrator Education comprehension: verbalized understanding, returned demonstration, and needs further education   HOME EXERCISE PROGRAM:   Reviewed activities at home to promote isolated 2nd digit extension.    GOALS: Goals reviewed with patient? Yes   SHORT TERM GOALS: Target date: 11/12/2023       1. Patient will be independent with home exercise program for the left upper extremity Baseline: 09/17/23: Independent 08/20/23: Pt. continues to require assist from he husband for HEPs 07/23/2023: Pt. continues to require assist. 05/28/2023: Pt. Requires assist 04/09/2023: Pt. Continues to consistently attempt to engage her hand at hand 02/01/2023: Pt. Consistently attempts to perform HEPs independently. No current home exercise program Goal status: Ongoing   LONG TERM GOALS: Target date:  08/20/2023   1.  Patient will improve left shoulder strength by 2 mm grades to be able to sustain UE's in  elevation long enough to wash her hair.  Baseline: 10/27/2023:  Pt. Is improving with reaching up to reach her hair for hair care. Pt. Continues to have difficulty sustaining the UE in elevation long enough to perform hair care. 09/17/2023: Left shoulder 2+/5, abduction 2+/5 08/20/2023: Left shoulder flexion 2+/5, abduction 2+/5  07/23/2023: Left shoulder flexion: 2/5, abduction: 2/5. 05/28/2023: Left shoulder flexion: 3-/5, abduction: 3-/5. Pt. has an old left shoulder injury limiting progression with left shoulder strength 05/26/2023: Pt. continues to present with limitiations in sustaining LUE elevation long enough to perform hair care thoroughly 04/09/2023: Pt. is limited with sustaining LUE elevation long enough to perform hair care thoroughly. 03/03/2023: Left shoulder flexion: 3-/5, abduction: 3-/5 01/22/2023: Left shoulder flexion: 3-/5, abduction: 3-/5 Eval: Left shoulder flexion: 3/5, abduction: 3-/5 Goal status: Ongoing   2.  Patient will improve left shoulder active abduction to be able to comb her hair Baseline: 10/27/2023: Pt. Is improving with reaching the left side of her hair for haircare. Pt. Has difficulty reaching with the left side to the back, and right side of her head.09/17/2023: 73(94) Pt. is able to reach her left side, and back of hair. 9/11/09/2023: 72(85) Pt. is able to reach her left side, and back of hair. 07/23/2023: Left shoulder flexion: 60(70) abduction: 65(78) 05/28/2023: left shoulder flexion: 108 abduction: 85 Pt. Has an old left shoulder injury limiting progression with left shoulder ROM 05/26/2023:  5/10 left shoulder pain with ROM limits using it functionally during hair care. 04/09/2023: Pt. Presents with difficulty abducting her left shoulder enough to thoroughly complete haircare. 03/03/2023: Shoulder abduction: 85 01/22/2023: Shoulder abduction: 85 Eval: Left shoulder abduction is 80(108) Goal status: Ongoing   3.  Patient will independently button her shirt with modified  independence. Baseline:10/27/23: Pt. Requires increased time to complete buttons.09/17/2023: Pt. Has a buttonhook, however does not know where it is. Pt. requires increased time to complete, and assist from her husband. 08/20/2023: Pt. requires increased time to complete, and assist from her husband. 07/23/2023 Pt. presents with difficulty buttoning her shirt. 05/28/2023: pt. Continues to work towards progressing with buttoning 05/26/2023:  Pt. Continues to  progress towards buttoning. 04/09/2023: Pt. Continues to progress towards buttoning. 03/03/2023: Pt. Continues to have difficulty with buttoning. 01/22/2023: Pt. continues to have difficulty. Eval: Patient has difficulty.  Goal status: Ongoing   4.  Patient well improve left grip strength in preparation for securely hold a glass/beverage Baseline: 10/27/2023: Pt. Has difficulty securely holding heavier items with larger diameter. 09/17/2023: Grip strength: Right: 5#, Left: 4# Pt. Has difficulty holding a glass. Right: 8#, Left: 4# Pt. Is able to more securely hold mirrors, and flowers with the left hand, however has difficulty holding a glass beverage, or bottle. 08/20/2023: Right: 8#, Left: 4# Pt. Is able to more securely hold mirrors, and flowers with the left hand, however has difficulty holding a glass beverage, or bottle. 07/23/2023: Grip strength: Right: 5#, Left: 2# 05/28/2023: Right: 26#, Left: 13#  pt. Presents with difficulty securely holding flowers in her left hand. 05/26/2023: TBD 04/09/2023: Pt. Is able to hold, and hike pants with the left hand, continues to have difficulty with securely holding flowers.   03/03/2023: left grip strength: 13# 01/22/2023: Left: 12# Eval: Pt. Is unable to securely hold flowers. Goal status: Ongoing   5.  Pt. will independently recall adaptive  strategies for performing ADL/ADL kitchen tasks.  Baseline:11/19/204: Continue 08/20/2023: Revised to include IADL kitchen tasks.  07/23/2023: Continue 05/28/2023: Pt. Needs continued  education about adaptive strategies.05/26/2023: Continue 04/09/2023: Continue3/26/2024: Continue 01/22/2023: Pt. continues to benefit from education about adaptive strategies during ADLs, and IADLs. Eval: Pt. to be provided with adaptive strategies. Goal status: Revised   6.  Pt. will improve FOTO score by 2 points to reflect patient perceived performance improvement assessment specific ADLs  and IADLs Baseline: 16/09/9603: TBD10/09/2023: FOTO score: 61 07/23/2023: FOTO score: 57 05/28/2023: FOTO score 59 05/26/2023: TBD 5/02/204:  TBD 03/03/23: FOTO 62 Eval: 57 Goal status: Ongoing  7.  Pt. will improve left hand coordination skills in order to be able to  manipulate small objects.     Baseline: 10/27/2023: Pt. Is improving with manipulating small objects.09/17/2023: Right: 26 sec., Left:  3 min, & 49 sec. pegs placed in 5 min.; Pt. Was able to remove 9 vertical pegs in 24 sec. 08/20/2023: Right: 27 sec., Left:  5 pegs placed in 5 min.; Pt. Was able to remove 9 vertical pegs in 28 sec. Pt. Is able to hold, and sort utensils in a drawer. Pt. Has difficulty manipulating small objects. 07/23/2023: Right: 33 sec., Left:  Pt. Is unable to grasp, and place pegs into the pegboard. Pt. Was able to remove 9 vertical pegs in 34 sec. 05/28/2023: Left:  5 min. 05/26/2023 Pt. Continues to present with difficulty handling and sorting utensils. 04/14/2023: 56 04/09/2023: TBD 03/03/23: 1 min. & 39 01/22/2022: Left: 3 min. & 4 sec. Eval: Pt. has difficulty sorting, and placing utensils with the left hand. Left FMC : >5 min. For 7 pegs on the 9 hole peg test.    Goal Status: Ongoing  8. Pt. will improve active left 2nd digit extension to be able able to isolate her 2nd digit in preparation for pressing/pushing buttons on appliances, phones, or remotes. Baseline: 10/27/2023: Pt. Is improving with 2nd digit isolation, to however presents with limited prolonged 2nd digit extension. 09/17/2023: Pt. Presents with limited isolated 2nd digit  extension with increased time. 08/20/2023: Pt. Is more consistently able to isolate 2nd digit extension with increased time. 07/23/2023: Pt. Continues to work on consistency with isolating left 2nd digit extension 05/28/2023: Pt. Continues to improve with left  2nd digit extension 05/26/2023: Improving with left 2nd digit extension. 04/09/2023: Pt. Is progressing with isolating left 2nd digit extension, however continues to present with limited increased flexor tone. 03/03/23: Pt. Continues to work on improving consistency with 2nd digit extension to press the remote. 01/22/2023: is able to perform full digit extension, although 2nd digit is slow to extend s 2/2 flexor tone. Pt.  Eval: Pt. is able to is unable to actively perform full digit extension Goal status: Ongoing  9: Pt. Will cut food with modified independence    Baseline: 10/27/2023: Pt. Continues to have difficulty cutting food on her plate. 09/17/2023: Pt. Is unable to cut the food on her plate, as well as cut food for meal preparation. 08/20/2023: Pt. Is unable to cut the food on her plate, as well as cut food for meal preparation.    Goal Status: Ongoing   ASSESSMENT:   CLINICAL IMPRESSION:  Pt. continues to be able to tolerate all ROM exercises, as well as increased resistance on the UBE without pain today. Pt. requires cues, and assist for controlled dropping of pegs from the ulnar aspect of her palm. Pt. presented with difficulty performing translatory movements moving multiple objects through her hand dropping multiple pegs from the tips of her fingers. Pt. was able to perform translatory movements with one at a time.  Pt. continues to work on improving LUE ROM, strength, FMC, and hand function skills in order to work towards improving, and maximizing independence with ADLs, and IADL tasks.   PERFORMANCE DEFICITS in functional skills including ADLs, IADLs, coordination, proprioception, ROM, strength, FMC, and GMC, cognitive skills including  memory, and psychosocial skills including coping strategies, environmental adaptation, interpersonal interactions, and routines and behaviors.    IMPAIRMENTS are limiting patient from ADLs, IADLs, education, leisure, and social participation.    COMORBIDITIES may have co-morbidities  that affects occupational performance. Patient will benefit from skilled OT to address above impairments and improve overall function.   MODIFICATION OR ASSISTANCE TO COMPLETE EVALUATION: Min-Moderate modification of tasks or assist with assess necessary to complete an evaluation.   OT OCCUPATIONAL PROFILE AND HISTORY: Detailed assessment: Review of records and additional review of physical, cognitive, psychosocial history related to current functional performance.   CLINICAL DECISION MAKING: Moderate - several treatment options, min-mod task modification necessary   REHAB POTENTIAL: Good   EVALUATION COMPLEXITY: Moderate      PLAN: OT FREQUENCY: 2x/week   OT DURATION: 12 weeks   PLANNED INTERVENTIONS: self care/ADL training, therapeutic exercise, therapeutic activity, neuromuscular re-education, manual therapy, passive range of motion, functional mobility training, electrical stimulation, and paraffin   RECOMMENDED OTHER SERVICES: PT   CONSULTED AND AGREED WITH PLAN OF CARE: Patient and family member/caregiver   PLAN FOR NEXT SESSION: see above    Olegario Messier, MS, OTR/L  10/28/2023

## 2023-10-29 ENCOUNTER — Ambulatory Visit: Payer: Medicare HMO | Admitting: Occupational Therapy

## 2023-10-29 ENCOUNTER — Ambulatory Visit: Payer: Medicare HMO | Admitting: Physical Therapy

## 2023-10-29 DIAGNOSIS — R2681 Unsteadiness on feet: Secondary | ICD-10-CM

## 2023-10-29 DIAGNOSIS — R531 Weakness: Secondary | ICD-10-CM | POA: Diagnosis not present

## 2023-10-29 DIAGNOSIS — R262 Difficulty in walking, not elsewhere classified: Secondary | ICD-10-CM

## 2023-10-29 DIAGNOSIS — I6381 Other cerebral infarction due to occlusion or stenosis of small artery: Secondary | ICD-10-CM | POA: Diagnosis not present

## 2023-10-29 DIAGNOSIS — R278 Other lack of coordination: Secondary | ICD-10-CM | POA: Diagnosis not present

## 2023-10-29 DIAGNOSIS — M6281 Muscle weakness (generalized): Secondary | ICD-10-CM

## 2023-10-29 NOTE — Therapy (Signed)
Occupational Therapy  Neuro Treatment/Recertification Note    Patient Name: [name] MRN: [id number] DOB:07-05-1941, 82 y.o., female Today's Date: 10/29/2023  PCP: Dr. Judithann Sheen REFERRING PROVIDER:  Dr. Judithann Sheen  END OF SESSION:  PLAN:   OT End of Session - 10/29/23 1556     Visit Number [id number]    Number of Visits 96    Date for OT Re-Evaluation 01/21/24    Authorization Time Period Progress report period starting 12/16/2022    OT Start Time 1530    OT Stop Time 1615    OT Time Calculation (min) 45 min    Activity Tolerance Patient tolerated treatment well    Behavior During Therapy WFL for tasks assessed/performed                   Past Medical History:  Diagnosis Date   Anemia    Arthritis    Asthma    uses inhaler just prior to surgery to avoid attack   Back pain    from previous injury   Complication of anesthesia    has woken  up during 2 different surgery   Depression    no current issue/treatment; situation   Gallstones    GERD (gastroesophageal reflux disease)    Hiatal hernia    patient does NOT have nerve/muscle disease   History of kidney stones    HLD (hyperlipidemia)    HTN (hypertension)    Hypothyroidism    Kidney stones    Knee pain    Nausea and vomiting 10/15/2022   Non-diabetic pancreatic hormone dysfunction years   pt. 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   Pancreatitis    Pneumonia    Seizures (HCC)    caused by dye injected during a procedure   Shortness of breath    with exertion   Sinus problem    frequent infections/congestion   Stroke (HCC) 2021   reports having CVA in 2021 and having mini strokes before that   Thyroid disease    Past Surgical History:  Procedure Laterality Date   ABDOMINAL HYSTERECTOMY     APPENDECTOMY     CARPAL TUNNEL RELEASE  10+ years ago   bilateral   EYE SURGERY  3 yrs ago   bilateral cataracts   FOOT OSTEOTOMY  6 weeks ago   Left foot: great, 2nd & 3rd   FOOT OSTEOTOMY  5 years ago    Right great toe   HAND SURGERY Bilateral 2011-most recent   multiple hand surgeries, 2 on left, 3 on right   KNEE ARTHROPLASTY Right 04/28/2022   Procedure: COMPUTER ASSISTED TOTAL KNEE ARTHROPLASTY;  Surgeon: Donato Heinz, MD;  Location: [facility];  Service: Orthopedics;  Laterality: Right;   LOOP RECORDER INSERTION N/A 05/16/2020   Procedure: LOOP RECORDER INSERTION;  Surgeon: Marcina Millard, MD;  Location: [facility];  Service: Cardiovascular;  Laterality: N/A;   NASAL SINUS SURGERY  most recent 7-8 yrs ago   7 sinus surgeries    TRIGGER FINGER RELEASE  11/19/2011   Procedure: RELEASE TRIGGER FINGER/A-1 PULLEY;  Surgeon: Nicki Reaper, MD;  Location: 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  HLD (hyperlipidemia) 11/23/2022   Asthma 11/23/2022   Depression with anxiety 11/23/2022   Chronic diastolic CHF (congestive heart failure) (HCC) 11/23/2022   Normocytic anemia 11/23/2022   Aspiration pneumonia (HCC) 11/23/2022   AKI (acute kidney injury) (HCC) 11/23/2022   Abdominal pain 11/23/2022   Mesenteric mass 11/23/2022   Coffee ground emesis 11/23/2022   Nausea and vomiting 10/15/2022   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   Fatigue 09/17/2022   Left hemiparesis (HCC) 09/17/2022   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HCC) 08/22/2022   GERD (gastroesophageal reflux disease) 08/21/2022   Agitation 08/20/2022   Acute left-sided weakness 08/20/2022   Expressive aphasia    Stroke (HCC) 08/19/2022   Leukocytosis 08/19/2022   History of urticaria 04/28/2022   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   Primary osteoarthritis of left knee 02/24/2022   Primary  osteoarthritis of right knee 02/24/2022   Lumbar spondylolysis 04/12/2020   History of CVA (cerebrovascular accident) 03/26/2020   Low back pain radiating to right lower extremity 03/21/2020   B12 deficiency 03/06/2020   Positive anti-CCP test 12/21/2019   Arthralgia 12/13/2019   Dermatitis 12/13/2019   Rheumatoid factor positive 12/13/2019   Essential hypertension 12/11/2018   Palpitations 12/11/2018   Acquired hypothyroidism 11/10/2018   Arthritis of knee 09/17/2016   Anxiety 11/22/2014   Asthma without status asthmaticus 11/22/2014   Benign neoplasm of colon, unspecified 11/22/2014   Environmental allergies 11/22/2014   Hypertriglyceridemia 11/22/2014   Hypokalemia 11/22/2014   Personal history of disease of skin and subcutaneous tissue 11/22/2014   REFERRING DIAG: CVA   THERAPY DIAG:  Muscle weakness (generalized)   Other lack of coordination   Rationale for Evaluation and Treatment Rehabilitation   SUBJECTIVE:   SUBJECTIVE STATEMENT:  Pt. reports feeling good today  Pt accompanied by: significant other   PERTINENT HISTORY: Patient is an 82 year-old female who was admitted to [facility] on 11/23/2022 with a cerebellar CVA. 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 Pt. Was admitted to Inpatient Rehab from 12/21-1/02/2023. 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  Patient underwent inpatient rehabilitation for 2 weeks.  Patient was assessed and was scheduled for a knee replacement on 08/29/2022 however had to cancel it due to having had a CVA.  Patient had a recent fall 2 days after discharging from inpatient rehab. Past Medical History includes: Knee replacement, essential HTN, hypokalemia, leukocytosis, seizures, positive anti-- CCP test, anxiety disorder, mini strokes.  Patient had shingles with left eye nerve pain s/p 1 year ago.    PRECAUTIONS: Fall   WEIGHT BEARING RESTRICTIONS No    PAIN:  Are you having pain?  No pain.   FALLS: Has patient fallen in last 6 months? Yes. Number of falls 1   LIVING ENVIRONMENT: Lives with: Lives with Spouse Lives in: House/apartment Stairs: 2 storey home, resides on the first floor.  External: 2 stairs front no rails, and 6 in back with rails Has following equipment at home: Single point cane, Walker - 2 wheeled, Environmental consultant - 4 wheeled, Shower bench, and bed side commode   PLOF: Independent   PATIENT GOALS  To Regain the use of her left arm   OBJECTIVE:    HAND DOMINANCE: Right   ADLs: Overall ADLs: Husband assists pt. as needed Transfers/ambulation related to ADLs:Pt. Uses a 3 wheeled walker with Husband assist. Eating: Pt. Is independent with the right hand. Pt. has difficulty cutting food. Grooming: Pt. is using her right hand, however has difficulty  sustaining her LUE in elevation to assist with haircare. UB Dressing: Pt. Is independent donning a pullover shirt, and button down shirt. Has difficulty with buttoning, LB Dressing:  Independent donning pants, and socks. Difficulty tying shoes. Toileting: Independent Bathing: Pt. Is able to engage her right hand. Tub Shower transfers: Supervision Equipment: See above for equipment    IADLs: Shopping:  Has not had the opportunity for grocery shopping yet Light housekeeping: Husband is assisting with light house keeping Meal Prep:  Dependent Community mobility: Relies of family/friends Medication management: Husband assisting with weekly pillbox set-up, and administering medication. Financial management: TBD Handwriting: 75% legible   MOBILITY STATUS: Hx of falls   POSTURE COMMENTS:  No Significant postural limitations Sitting balance: supported sitting balance WFL   ACTIVITY TOLERANCE: Activity tolerance:  Fatigues in greater than 30 min.    FUNCTIONAL OUTCOME MEASURES: FOTO: 57   UPPER EXTREMITY ROM      Active ROM Right Eval: WFL Left eval Left  01/22/23 Left   03/03/23 Left  04/14/23 Left  05/28/2023 Left 07/23/2023 Left 08/20/2023 Left 09/17/23 Left 10/29/23  Shoulder flexion   132 100 108 108 108 60(70) 70(78) 70(102) 82(110)  Shoulder abduction   80 85 85 85 85 65(78) 72(85) 73(94) 75(98)  Shoulder adduction              Shoulder extension              Shoulder internal rotation              Shoulder external rotation              Elbow flexion   140 140 WFL [facility] WFL  Elbow extension   WNL WNL [facility] WFL WFL WFL  Wrist flexion   65 68         Wrist extension   -10 20 24  32 32 -10 20 20 26   Wrist ulnar deviation     12 10 14 14 10 18 18 20   Wrist radial deviation     8 14 14 16 12 14 14 14   Wrist pronation              Wrist supination              (Blank rows = not tested)   Left digit flexion to [facility]: 2nd: 0cm, 3rd: 0cm, 4th: 0cm, 5th: 0cm   Limited Left full 2nd digit extension     UPPER EXTREMITY MMT:      MMT Right Eval: 4+/5 overall Right 07/23/2023 Left Eval Left 01/22/23 Left 03/03/23 Left  04/14/2023 Left 05/28/2023 Left 07/23/2023 Left 08/20/2023 Left 09/17/23 Left  10/29/23  Shoulder flexion   3+/5 3/5 3-/5 3-/5 3-/5 3-/5 2/5 2+/5 2+/5 3-/5  Shoulder abduction   3+/5 3-/5 3-/5 3-/5 3-/5 3-/5 2/5 2+/5 2+/5 3-/5  Shoulder adduction               Shoulder extension               Shoulder internal rotation               Shoulder external rotation               Middle trapezius               Lower trapezius               Elbow flexion   4/5 3+/5 4/5  N/A 4/5 4/5 3+/5 3+/5 3+/5 3+/5  Elbow extension   4/5 3+/5 4/4 N/A 4/5 4/5 3+/5 3+/5 4-/5 4-/5  Wrist flexion               Wrist extension   4/5 2-/5 3-/5 3-/5 3/5 3+/5 3-/5 3+/5 3+/5 3+/5  Wrist ulnar deviation               Wrist radial deviation               Wrist pronation               Wrist supination               (Blank rows = not tested)   HAND FUNCTION: Grip strength: Right: 26#, Left: 10# Pinch strength: Right 8#, Left: 3#, 3 Pt.  Pinch strength: Right: 9#, L: 2#  01/22/2023 Grip strength: Right: 26#, Left: 12# Pinch strength:  Pinch meter used at the initial eval has been sent out for recalibration   03/03/2023 Grip strength: Right: 26#, Left: 13# Pinch strength:  Pinch meter used at the initial eval has been sent out for recalibration  04/14/2023: Grip strength: Right: 26#, Left: 13# Pinch strength:   Right 8#, Left: 4#, 3 Pt. Pinch strength: Right: 9#, L: 3#    05/28/2023: Grip strength: Right: 26#, Left: 13# Pinch strength:   Right 8#, Left: 2#, 3 Pt. Pinch strength: Right: 9#, L: 4#  07/23/2023  Grip strength: Right: 5#, Left: 2# Pinch strength:   Right 6#, Left: 2#, 3 Pt. Pinch strength: Right: 5#, L: 2#  08/20/2023:    Grip strength: Right: 8#, Left: 4# Pinch strength:   Right 6#, Left: 2#, 3 Pt. Pinch strength: Right: 5#, L: 2#    09/17/23:  Grip strength: Right: 5#, Left: 4# Pinch strength:   Right 6#, Left: 2#, 3 Pt. Pinch strength: Right: 5#, L: 2#  10/28/2024  Grip strength: Right: 13#, Left: 6# Pinch strength:   Right 6#, Left: 2#, 3 Pt. Pinch strength: Right: 5#, L: 2#     COORDINATION: Right: 22 sec., Left: <5 min. To place 7 pegs with increased compensation proximally in the trunk, and through reflexive associated reactions.  01/22/23 Right: 22 sec., Left: 3 min. & 4 sec.  03/03/23 Right: 22 sec., Left: 1 min. & 39 sec.  04/14/23   TBD  05/28/23 Right: 22 sec., Left:  5 min.  07/23/2023  Right: 33 sec., Left:  Pt. Is unable to grasp, and place pegs into the pegboard. Pt. Was able to remove 9 vertical pegs in 34 sec.  08/20/2023:  Right: 27 sec., Left:  5 pegs placed in 5 min.; Pt. Was able to remove 9 vertical pegs in 28 sec.  09/17/23:  Right: 26 sec., Left:  3 min, & 49 sec.    10/28/2024:  Right: 26 sec., Left:  2 min, & 10 sec.        SENSATION: Light touch: WFL, proprioceptive awareness: Intact   EDEMA: N/A   MUSCLE TONE: LUE: Hypotonic    COGNITION: Overall cognitive status: WFL for tasks assessed. Pt. Is impulsive at times.   VISION: Subjective report: Pt. report having shingles affecting left eye  s/p 1 year. Has nerve pain Baseline vision: Wears glasses for reading only Visual history:  updated see clinical impression   VISION ASSESSMENT:    WFL for tasks performed   PERCEPTION: Intact   PRAXIS: Impaired: Motor planning   OBSERVATIONS:  Pt. more alert,  and engaging since prior to the most recent hospitalization.   TODAY'S TREATMENT:    Measurements were obtained, and goals were reviewed with the Pt.   There. Ex.:   Pt. tolerated PROM/AAROM to the Left shoulder, elbow, wrist, and digits. Pt. worked on [employer], and reciprocal motion using the UBE while seated for 8 min. with minimal resistance.  Constant monitoring was provided.           PATIENT EDUCATION: Education details: LUE therapeutic exercise Person educated: Patient and Spouse Education method: Medical illustrator Education comprehension: verbalized understanding, returned demonstration, and needs further education   HOME EXERCISE PROGRAM:   Reviewed activities at home to promote isolated 2nd digit extension.    GOALS: Goals reviewed with patient? Yes   SHORT TERM GOALS: Target date: 12/10/2023   1. Patient will be independent with home exercise program for the left upper extremity Baseline: 10/29/2023: Independent 09/17/23: Independent 08/20/23: Pt. continues to require assist from he husband for HEPs 07/23/2023: Pt. continues to require assist. 05/28/2023: Pt. Requires assist 04/09/2023: Pt. Continues to consistently attempt to engage her hand at hand 02/01/2023: Pt. Consistently attempts to perform HEPs independently. No current home exercise program Goal status: Ongoing   LONG TERM GOALS: Target date:  01/21/2024  1.  Patient will improve left shoulder strength by 2 mm grades to be able to sustain UE's in elevation long  enough to wash her hair.  Baseline: 10/29/2023: Left shoulder flexion 3-/5, abduction: 3-/5 10/27/2023:  Pt. Is improving with reaching up to reach her hair for hair care. Pt. Continues to have difficulty sustaining the UE in elevation long enough to perform hair care. 09/17/2023: Left shoulder 2+/5, abduction 2+/5 08/20/2023: Left shoulder flexion 2+/5, abduction 2+/5  07/23/2023: Left shoulder flexion: 2/5, abduction: 2/5. 05/28/2023: Left shoulder flexion: 3-/5, abduction: 3-/5. Pt. has an old left shoulder injury limiting progression with left shoulder strength 05/26/2023: Pt. continues to present with limitiations in sustaining LUE elevation long enough to perform hair care thoroughly 04/09/2023: Pt. is limited with sustaining LUE elevation long enough to perform hair care thoroughly. 03/03/2023: Left shoulder flexion: 3-/5, abduction: 3-/5 01/22/2023: Left shoulder flexion: 3-/5, abduction: 3-/5 Eval: Left shoulder flexion: 3/5, abduction: 3-/5 Goal status: Ongoing   2.  Patient will improve left shoulder active abduction to be able to comb her hair Baseline: 10/29/2023: Left shoulder abduction: 75(98) 10/27/2023: Pt. Is improving with reaching the left side of her hair for haircare. Pt. Has difficulty reaching with the left side to the back, and right side of her head.09/17/2023: 73(94) Pt. is able to reach her left side, and back of hair. 9/11/09/2023: 72(85) Pt. is able to reach her left side, and back of hair. 07/23/2023: Left shoulder flexion: 60(70) abduction: 65(78) 05/28/2023: left shoulder flexion: 108 abduction: 85 Pt. Has an old left shoulder injury limiting progression with left shoulder ROM 05/26/2023:  5/10 left shoulder pain with ROM limits using it functionally during hair care. 04/09/2023: Pt. Presents with difficulty abducting her left shoulder enough to thoroughly complete haircare. 03/03/2023: Shoulder abduction: 85 01/22/2023: Shoulder abduction: 85 Eval: Left shoulder abduction is 80(108) Goal  status: Ongoing   3.  Patient will independently button her shirt with modified independence. Baseline:10/29/2023: Pt. Has difficulty manipulating buttons, will benefit from a buttonhook.10/27/23: Pt. Requires increased time to complete buttons.09/17/2023: Pt. Has a buttonhook, however does not know where it is. Pt. requires increased time to complete, and assist from her husband. 08/20/2023: Pt. requires increased time to complete, and  assist from her husband. 07/23/2023 Pt. presents with difficulty buttoning her shirt. 05/28/2023: pt. Continues to work towards progressing with buttoning 05/26/2023:  Pt. Continues to progress towards buttoning. 04/09/2023: Pt. Continues to progress towards buttoning. 03/03/2023: Pt. Continues to have difficulty with buttoning. 01/22/2023: Pt. continues to have difficulty. Eval: Patient has difficulty.  Goal status: Ongoing   4.  Patient well improve left grip strength in preparatio for securely hold a glass/beverage Baseline: 10/29/2023: Grip strength: Right: 13#  Left: 6#10/27/2023: Pt. Has difficulty securely holding heavier items with larger diameter. 09/17/2023: Grip strength: Right: 5#, Left: 4# Pt. Has difficulty holding a glass. Right: 8#, Left: 4# Pt. Is able to more securely hold mirrors, and flowers with the left hand, however has difficulty holding a glass beverage, or bottle. 08/20/2023: Right: 8#, Left: 4# Pt. Is able to more securely hold mirrors, and flowers with the left hand, however has difficulty holding a glass beverage, or bottle. 07/23/2023: Grip strength: Right: 5#, Left: 2# 05/28/2023: Right: 26#, Left: 13#  pt. Presents with difficulty securely holding flowers in her left hand. 05/26/2023: TBD 04/09/2023: Pt. Is able to hold, and hike pants with the left hand, continues to have difficulty with securely holding flowers.   03/03/2023: left grip strength: 13# 01/22/2023: Left: 12# Eval: Pt. Is unable to securely hold flowers. Goal status: Ongoing   5.  Pt. will  independently recall adaptive  strategies for performing ADL/ADL kitchen tasks.  Baseline:10/29/2023: Continue 11/19/204: Continue 08/20/2023: Revised to include IADL kitchen tasks.  07/23/2023: Continue 05/28/2023: Pt. Needs continued education about adaptive strategies.05/26/2023: Continue 04/09/2023: Continue3/26/2024: Continue 01/22/2023: Pt. continues to benefit from education about adaptive strategies during ADLs, and IADLs. Eval: Pt. to be provided with adaptive strategies. Goal status: Revised   6.  Pt. will improve FOTO score by 2 points to reflect patient perceived performance improvement assessment specific ADLs  and IADLs Baseline: 16/09/9603: 66 10/27/2023: TBD 09/17/2023: FOTO score: 61 07/23/2023: FOTO score: 57 05/28/2023: FOTO score 59 05/26/2023: TBD 5/02/204:  TBD 03/03/23: FOTO 62 Eval: 57 Goal status: Ongoing  7.  Pt. will improve left hand coordination skills in order to be able to  manipulate small objects.     Baseline: 10/29/2023 Left: 2 min. & 10 sec. 10/27/2023: Pt. Is improving with manipulating small objects.09/17/2023: Right: 26 sec., Left:  3 min, & 49 sec. pegs placed in 5 min.; Pt. Was able to remove 9 vertical pegs in 24 sec. 08/20/2023: Right: 27 sec., Left:  5 pegs placed in 5 min.; Pt. Was able to remove 9 vertical pegs in 28 sec. Pt. Is able to hold, and sort utensils in a drawer. Pt. Has difficulty manipulating small objects. 07/23/2023: Right: 33 sec., Left:  Pt. Is unable to grasp, and place pegs into the pegboard. Pt. Was able to remove 9 vertical pegs in 34 sec. 05/28/2023: Left:  5 min. 05/26/2023 Pt. Continues to present with difficulty handling and sorting utensils. 04/14/2023: 56 04/09/2023: TBD 03/03/23: 1 min. & 39 01/22/2022: Left: 3 min. & 4 sec. Eval: Pt. has difficulty sorting, and placing utensils with the left hand. Left FMC : >5 min. For 7 pegs on the 9 hole peg test.    Goal Status: Ongoing  8. Pt. will improve active left 2nd digit extension to be able able to  isolate her 2nd digit in preparation for pressing/pushing buttons on appliances, phones, or remotes. Baseline: 10/29/2023: Pt. Is improving with 2nd digit isolation, to however continues to present with limited prolonged 2nd digit  extension11/19/2024: Pt. Is improving with 2nd digit isolation, to however presents with limited prolonged 2nd digit extension. 09/17/2023: Pt. Presents with limited isolated 2nd digit extension with increased time. 08/20/2023: Pt. Is more consistently able to isolate 2nd digit extension with increased time. 07/23/2023: Pt. Continues to work on consistency with isolating left 2nd digit extension 05/28/2023: Pt. Continues to improve with left 2nd digit extension 05/26/2023: Improving with left 2nd digit extension. 04/09/2023: Pt. Is progressing with isolating left 2nd digit extension, however continues to present with limited increased flexor tone. 03/03/23: Pt. Continues to work on improving consistency with 2nd digit extension to press the remote. 01/22/2023: is able to perform full digit extension, although 2nd digit is slow to extend s 2/2 flexor tone. Pt.  Eval: Pt. is able to is unable to actively perform full digit extension Goal status: Ongoing  9: Pt. Will cut food with modified independence Baseline: 10/29/2023: : Pt. Continues to have difficulty cutting food on her plate.10/27/2023: Pt. Continues to have difficulty cutting food on her plate. 09/17/2023: Pt. Is unable to cut the food on her plate, as well as cut food for meal preparation. 08/20/2023: Pt. Is unable to cut the food on her plate, as well as cut food for meal preparation.    Goal Status: Ongoing   ASSESSMENT:   CLINICAL IMPRESSION:  Measurements were obtained, and goals were reviewed with the Pt.Pt. has made excellent progress tover this past certification period with LEFT shoulder ROM for flexion, and abduction. Pt. Is reaching higher for items, and has improved with activity tolerance. Pt. Has improved with left  UE strength, grip strength, pinch strength, and has made tremendous gains with FMC speed and dexterity. Pt. is able to engage her LUE more during ADL, and hair care tasks. Pt. continues to present with limited left shoulder ROM, limited LUE strength, and limited left hand [facility] skills. Pt. continues to work on improving LUE ROM, strength, FMC, and hand function skills in order to work towards improving, and maximizing independence with ADLs, and IADL tasks.   PERFORMANCE DEFICITS in functional skills including ADLs, IADLs, coordination, proprioception, ROM, strength, FMC, and GMC, cognitive skills including memory, and psychosocial skills including coping strategies, environmental adaptation, interpersonal interactions, and routines and behaviors.    IMPAIRMENTS are limiting patient from ADLs, IADLs, education, leisure, and social participation.    COMORBIDITIES may have co-morbidities  that affects occupational performance. Patient will benefit from skilled OT to address above impairments and improve overall function.   MODIFICATION OR ASSISTANCE TO COMPLETE EVALUATION: Min-Moderate modification of tasks or assist with assess necessary to complete an evaluation.   OT OCCUPATIONAL PROFILE AND HISTORY: Detailed assessment: Review of records and additional review of physical, cognitive, psychosocial history related to current functional performance.   CLINICAL DECISION MAKING: Moderate - several treatment options, min-mod task modification necessary   REHAB POTENTIAL: Good   EVALUATION COMPLEXITY: Moderate      PLAN: OT FREQUENCY: 2x/week   OT DURATION: 12 weeks   PLANNED INTERVENTIONS: self care/ADL training, therapeutic exercise, therapeutic activity, neuromuscular re-education, manual therapy, passive range of motion, functional mobility training, electrical stimulation, and paraffin   RECOMMENDED OTHER SERVICES: PT   CONSULTED AND AGREED WITH PLAN OF CARE: Patient and family  member/caregiver   PLAN FOR NEXT SESSION: see above    Olegario Messier, MS, OTR/L  10/29/2023

## 2023-10-29 NOTE — Therapy (Signed)
OUTPATIENT PHYSICAL THERAPY TREATMENT/     Patient Name: [name] MRN: [id number] DOB:03-Dec-1941, 82 y.o., female Today's Date: 10/29/2023   PCP: Aram Beecham, D MD REFERRING PROVIDER: Jacquelynn Cree PA   END OF SESSION:  PT End of Session - 10/29/23 1619     Visit Number 15    Number of Visits 31    Date for PT Re-Evaluation 12/24/23    Authorization Type [employer]:    Authorization Time Period approved for 24 additional tx from 09/28/23 to 12/24/23; total visit count 31    Authorization - Number of Visits 24    Progress Note Due on Visit 10    PT Start Time 1620    PT Stop Time 1700    PT Time Calculation (min) 40 min    Equipment Utilized During Treatment Gait belt    Activity Tolerance Patient tolerated treatment well    Behavior During Therapy WFL for tasks assessed/performed                 Past Medical History:  Diagnosis Date   Anemia    Arthritis    Asthma    uses inhaler just prior to surgery to avoid attack   Back pain    from previous injury   Complication of anesthesia    has woken  up during 2 different surgery   Depression    no current issue/treatment; situation   Gallstones    GERD (gastroesophageal reflux disease)    Hiatal hernia    patient does NOT have nerve/muscle disease   History of kidney stones    HLD (hyperlipidemia)    HTN (hypertension)    Hypothyroidism    Kidney stones    Knee pain    Nausea and vomiting 10/15/2022   Non-diabetic pancreatic hormone dysfunction years   pt. 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   Pancreatitis    Pneumonia    Seizures (HCC)    caused by dye injected during a procedure   Shortness of breath    with exertion   Sinus problem    frequent infections/congestion   Stroke (HCC) 2021   reports having CVA in 2021 and having mini strokes before that   Thyroid disease    Past Surgical History:  Procedure Laterality Date   ABDOMINAL HYSTERECTOMY     APPENDECTOMY     CARPAL  TUNNEL RELEASE  10+ years ago   bilateral   EYE SURGERY  3 yrs ago   bilateral cataracts   FOOT OSTEOTOMY  6 weeks ago   Left foot: great, 2nd & 3rd   FOOT OSTEOTOMY  5 years ago   Right great toe   HAND SURGERY Bilateral 2011-most recent   multiple hand surgeries, 2 on left, 3 on right   KNEE ARTHROPLASTY Right 04/28/2022   Procedure: COMPUTER ASSISTED TOTAL KNEE ARTHROPLASTY;  Surgeon: Donato Heinz, MD;  Location: [facility] ORS;  Service: Orthopedics;  Laterality: Right;   LOOP RECORDER INSERTION N/A 05/16/2020   Procedure: LOOP RECORDER INSERTION;  Surgeon: Marcina Millard, MD;  Location: [facility];  Service: Cardiovascular;  Laterality: N/A;   NASAL SINUS SURGERY  most recent 7-8 yrs ago   7 sinus surgeries    TRIGGER FINGER RELEASE  11/19/2011   Procedure: RELEASE TRIGGER FINGER/A-1 PULLEY;  Surgeon: Nicki Reaper, MD;  Location:  SURGERY CENTER;  Service: Orthopedics;  Laterality: Right;  release a-1 pulley right index finger and cyst removal  WRIST GANGLION EXCISION  1980's   right     ONSET DATE: 11/23/22  REFERRING DIAG: TIA  THERAPY DIAG:  Difficulty in walking, not elsewhere classified  Muscle weakness (generalized)  Unsteadiness on feet  Other lack of coordination  Acute left-sided weakness  Rationale for Evaluation and Treatment: Rehabilitation  SUBJECTIVE:                                                                                                                                                                                             SUBJECTIVE STATEMENT:   Pt reports that she is doing well. States that she did very well in OT improving score on tests and measures.  Arrives wearing knee stabilizing brace to reduce genuvalgus. Reports that knee was grinding more yesterday with increased pain compared to earlier in the week.    Pt accompanied by: significant other  PERTINENT HISTORY: Arva Alber is an 82 year-old female who was  admitted to [facility] on 11/23/2022 with a cerebellar CVA. 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 Pt. Was admitted to Inpatient Rehab from 12/21-1/02/2023. 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  Patient underwent inpatient rehabilitation for 2 weeks.  Patient was assessed and was scheduled for a knee replacement on 08/29/2022 however had to cancel it due to having had a CVA. PMH includes: anemia, arthritis, back pain, depression, hernia, GERD, HLD, HTN, hypothyroidism, knee pain, pancreatic hormone dysfunction, pneumonia, seizures, stroke, thyroid disease.  Patient has a donjoy brace for L knee.   PAIN:  Are you having pain? no  PRECAUTIONS: Fall, has latex allergy  WEIGHT BEARING RESTRICTIONS: No  FALLS: Has patient fallen in last 6 months? Yes. Number of falls 4 falls   LIVING ENVIRONMENT: Lives with: lives with their family and lives with their spouse Lives in: House/apartment Stairs: Yes: Internal: flight steps;   and External: 2 steps; on right going up and on left going up Has following equipment at home: Single point cane, Walker - 2 wheeled, Shower bench, and bed side commode  PLOF: Independent  PATIENT GOALS: to have less pain and move better  OBJECTIVE:   TODAY'S TREATMENT:  DATE: 10/29/23  Stand pivot transfer to and from arm chair with CGA and min cues for safe gait pattern to reduce   Sit<>stand with RW 2 x 10 with CGA to prevent posterior LOB.   Gait with RW 2 x 49ft with CGA from PT and min cues for attention to the RLE   Seated therex:  LAQ 1.5#AW x 12  Hip flexion 1.5# AW x 12  Hip abduction manual resistance x 12  HS curl x 12 bil RTB  WC propulsion with BLE to force HS activation 2 x 108ft.  Ankle DF/PF  RTB x 12   Increased crepitus in L knee on  this day. Therefore reduced time in standing to prevent pain in the affected knee.    PATIENT EDUCATION: Education details: Pt educated throughout session about proper posture and technique with exercises. Improved exercise technique, movement at target joints, use of target muscles after min to mod verbal, visual, tactile cues.   HOME EXERCISE PROGRAM:  Access Code: [email or web address] URL: [email or web address] Date: 10/07/2023 Prepared by: Grier Rocher  Exercises - Standing March with Counter Support  - 1 x daily - 3 x weekly - 2 sets - 10 reps - Standing Hip Abduction with Unilateral Counter Support  - 1 x daily - 3 x weekly - 2 sets - 10 reps - 2 hold - Standing Hip Extension with Unilateral Counter Support  - 1 x daily - 3 x weekly - 2 sets - 10 reps - 2 hold - Supine Straight Leg Raises  - 1 x daily - 37 x weekly - 2 sets - 10 reps - Bridge  - 1 x daily - 3 x weekly - 2 sets - 10 reps - 2 hold - Supine Heel Slide  - 1 x daily - 3 x weekly - 2 sets - 10 reps  GOALS: Goals reviewed with patient? Yes  SHORT TERM GOALS: Target date: 09/19/23  Patient will be independent in starter HEP to improve A/ROM tolerance and preserve available ROM of LLE and to advance A/ROM and strength of RLE.  Baseline: 08/20/23: defer to vists 2-3  Goal status: NEW  LONG TERM GOALS: Target date: 10/20/23  Patient will increase FOTO score to equal to or greater than 65%  to demonstrate statistically significant improvement in ease of mobility.  Baseline:  47%; 06/23/23: 60;   7/16: 60%   10/31: 54 Goal status: Progressing   2.  Patient (> 80 years old) will complete five times sit to stand test in < 15 seconds indicating an increased RLE power.  Baseline: 5/7: unable to tolerate ; 7/16: 17.4sec 10/31: 11.02 Goal status: Progressing   3.   Patient to report tolerance of ad lib AMB in home during the day of distances >69ft >5x at supervision level or better to improve independence with ADL.     Baseline: 08/20/23: using transport chair for nearly all mobility, not tolerating walking due to WB limitations of LLE  10/31: able to ambulate 30-58ft with CGA from PT.  Goal status: Progressing     ASSESSMENT:  CLINICAL IMPRESSION:   Pt presented to PT session motivated to participate. PT treatment focused on BLE strengthening. Mild increase crepitus in pain in L knee limits time in standing on this day, but tolerated all seated therex without increased pain.  The patient will benefit from skilled physical therapy to reduce pain, improve mobility, improve strength and improve quality of life.     OBJECTIVE IMPAIRMENTS: Abnormal gait, cardiopulmonary status  limiting activity, decreased activity tolerance, decreased balance, decreased cognition, decreased endurance, decreased knowledge of use of DME, decreased mobility, difficulty walking, decreased ROM, decreased strength, decreased safety awareness, hypomobility, increased fascial restrictions, impaired perceived functional ability, impaired flexibility, impaired UE functional use, improper body mechanics, postural dysfunction, and pain.   ACTIVITY LIMITATIONS: carrying, lifting, bending, sitting, standing, squatting, stairs, transfers, bed mobility, bathing, toileting, dressing, reach over head, hygiene/grooming, locomotion level, and caring for others  PARTICIPATION LIMITATIONS: meal prep, cleaning, laundry, medication management, personal finances, interpersonal relationship, driving, shopping, community activity, yard work, school, and church  PERSONAL FACTORS: Age, Behavior pattern, Education, Fitness, Past/current experiences, Time since onset of injury/illness/exacerbation, and 3+ comorbidities: anemia, arthritis, back pain, depression, hernia, GERD, HLD, HTN, hypothyroidism, knee pain, pancreatic hormone dysfunction, pneumonia, seizures, stroke, thyroid disease  are also affecting patient's functional outcome.   REHAB POTENTIAL: Fair     CLINICAL DECISION MAKING: Evolving/moderate complexity  EVALUATION COMPLEXITY: Moderate  PLAN:  PT FREQUENCY: 1-2x/week  PT DURATION: 10 weeks  PLANNED INTERVENTIONS: Therapeutic exercises, Therapeutic activity, Neuromuscular re-education, Balance training, Gait training, Patient/Family education, Self Care, Joint mobilization, Joint manipulation, Stair training, Vestibular training, Canalith repositioning, Visual/preceptual remediation/compensation, Orthotic/Fit training, DME instructions, Electrical stimulation, Wheelchair mobility training, Spinal mobilization, Cryotherapy, Moist heat, Compression bandaging, scar mobilization, Splintting, Taping, Traction, Ultrasound, Manual therapy, and Re-evaluation  PLAN FOR NEXT SESSION:  Tolerance to standing in functional movement/ tasks.  Continue BLE strengthening in weighted and unweighted position   Golden Pop, PT, DPT 10/29/2023, 4:22 PM   4:22 PM, 10/29/23  Physical Therapist - [facility]  [phone] [facility])

## 2023-11-03 ENCOUNTER — Ambulatory Visit: Payer: Medicare HMO | Admitting: Physical Therapy

## 2023-11-03 ENCOUNTER — Ambulatory Visit: Payer: Medicare HMO | Admitting: Occupational Therapy

## 2023-11-03 DIAGNOSIS — M6281 Muscle weakness (generalized): Secondary | ICD-10-CM | POA: Diagnosis not present

## 2023-11-03 DIAGNOSIS — I6381 Other cerebral infarction due to occlusion or stenosis of small artery: Secondary | ICD-10-CM | POA: Diagnosis not present

## 2023-11-03 DIAGNOSIS — R278 Other lack of coordination: Secondary | ICD-10-CM | POA: Diagnosis not present

## 2023-11-03 DIAGNOSIS — R262 Difficulty in walking, not elsewhere classified: Secondary | ICD-10-CM

## 2023-11-03 DIAGNOSIS — R2681 Unsteadiness on feet: Secondary | ICD-10-CM

## 2023-11-03 DIAGNOSIS — R531 Weakness: Secondary | ICD-10-CM

## 2023-11-03 NOTE — Therapy (Unsigned)
OUTPATIENT PHYSICAL THERAPY TREATMENT/     Patient Name: [name] MRN: [id number] DOB:12-27-1940, 82 y.o., female Today's Date: 11/03/2023   PCP: Aram Beecham, D MD REFERRING PROVIDER: Jacquelynn Cree PA   END OF SESSION:  PT End of Session - 11/03/23 1636     Visit Number 16    Number of Visits 31    Date for PT Re-Evaluation 12/24/23    Authorization Type [employer] Medicare:    Authorization Time Period approved for 24 additional tx from 09/28/23 to 12/24/23; total visit count 31    Authorization - Number of Visits 24    Progress Note Due on Visit 10    PT Start Time 1618    PT Stop Time 1700    PT Time Calculation (min) 42 min    Equipment Utilized During Treatment Gait belt    Activity Tolerance Patient tolerated treatment well    Behavior During Therapy WFL for tasks assessed/performed                 Past Medical History:  Diagnosis Date   Anemia    Arthritis    Asthma    uses inhaler just prior to surgery to avoid attack   Back pain    from previous injury   Complication of anesthesia    has woken  up during 2 different surgery   Depression    no current issue/treatment; situation   Gallstones    GERD (gastroesophageal reflux disease)    Hiatal hernia    patient does NOT have nerve/muscle disease   History of kidney stones    HLD (hyperlipidemia)    HTN (hypertension)    Hypothyroidism    Kidney stones    Knee pain    Nausea and vomiting 10/15/2022   Non-diabetic pancreatic hormone dysfunction years   pt. 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   Pancreatitis    Pneumonia    Seizures (HCC)    caused by dye injected during a procedure   Shortness of breath    with exertion   Sinus problem    frequent infections/congestion   Stroke (HCC) 2021   reports having CVA in 2021 and having mini strokes before that   Thyroid disease    Past Surgical History:  Procedure Laterality Date   ABDOMINAL HYSTERECTOMY     APPENDECTOMY     CARPAL  TUNNEL RELEASE  10+ years ago   bilateral   EYE SURGERY  3 yrs ago   bilateral cataracts   FOOT OSTEOTOMY  6 weeks ago   Left foot: great, 2nd & 3rd   FOOT OSTEOTOMY  5 years ago   Right great toe   HAND SURGERY Bilateral 2011-most recent   multiple hand surgeries, 2 on left, 3 on right   KNEE ARTHROPLASTY Right 04/28/2022   Procedure: COMPUTER ASSISTED TOTAL KNEE ARTHROPLASTY;  Surgeon: Donato Heinz, MD;  Location: [facility] OR[facility];  Service: Orthopedics;  Laterality: Right;   LOOP RECORDER INSERTION N/A 05/16/2020   Procedure: LOOP RECORDER INSERTION;  Surgeon: Marcina Millard, MD;  Location: [facility];  Service: Cardiovascular;  Laterality: N/A;   NASAL SINUS SURGERY  most recent 7-8 yrs ago   7 sinus surgeries    TRIGGER FINGER RELEASE  11/19/2011   Procedure: RELEASE TRIGGER FINGER/A-1 PULLEY;  Surgeon: Nicki Reaper, MD;  Location:  SURGERY CENTER;  Service: Orthopedics;  Laterality: Right;  release a-1 pulley right index finger and cyst removal  WRIST GANGLION EXCISION  1980's   right     ONSET DATE: 11/23/22  REFERRING DIAG: TIA  THERAPY DIAG:  No diagnosis found.  Rationale for Evaluation and Treatment: Rehabilitation  SUBJECTIVE:                                                                                                                                                                                             SUBJECTIVE STATEMENT:   Pt reports that she is doing well. States that she did very well in OT improving score on tests and measures.  Arrives wearing knee stabilizing brace to reduce genuvalgus. Reports that knee was grinding more yesterday with increased pain compared to earlier in the week.    Pt accompanied by: significant other  PERTINENT HISTORY: [name] is an 84 year-old female who was admitted to [facility] on 11/23/2022 with a cerebellar CVA. 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 Pt. Was  admitted to Inpatient Rehab from 12/21-1/02/2023. 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  Patient underwent inpatient rehabilitation for 2 weeks.  Patient was assessed and was scheduled for a knee replacement on 08/29/2022 however had to cancel it due to having had a CVA. PMH includes: anemia, arthritis, back pain, depression, hernia, GERD, HLD, HTN, hypothyroidism, knee pain, pancreatic hormone dysfunction, pneumonia, seizures, stroke, thyroid disease.  Patient has a donjoy brace for L knee.   PAIN:  Are you having pain? no  PRECAUTIONS: Fall, has latex allergy  WEIGHT BEARING RESTRICTIONS: No  FALLS: Has patient fallen in last 6 months? Yes. Number of falls 4 falls   LIVING ENVIRONMENT: Lives with: lives with their family and lives with their spouse Lives in: House/apartment Stairs: Yes: Internal: flight steps;   and External: 2 steps; on right going up and on left going up Has following equipment at home: Single point cane, Walker - 2 wheeled, Shower bench, and bed side commode  PLOF: Independent  PATIENT GOALS: to have less pain and move better  OBJECTIVE:   TODAY'S TREATMENT:  DATE: 11/03/23  Stand pivot transfer to and from arm chair with CGA and min cues for safe gait pattern to reduce   Sit<>stand with RW 2 x 10 with CGA to prevent posterior LOB.   Gait with RW 2 x 36ft with CGA from PT and min cues for attention to the RLE   Seated therex:  LAQ 1.5#AW x 12  Hip flexion 1.5# AW x 12  Hip abduction manual resistance x 12  HS curl x 12 bil RTB  WC propulsion with BLE to force HS activation 2 x 48ft.  Ankle DF/PF  RTB x 12   Increased crepitus in L knee on this day. Therefore reduced time in standing to prevent pain in the affected knee.    PATIENT EDUCATION: Education details: Pt educated  throughout session about proper posture and technique with exercises. Improved exercise technique, movement at target joints, use of target muscles after min to mod verbal, visual, tactile cues.   HOME EXERCISE PROGRAM:  Access Code: [email or web address] URL: [email or web address] Date: 10/07/2023 Prepared by: Grier Rocher  Exercises - Standing March with Counter Support  - 1 x daily - 3 x weekly - 2 sets - 10 reps - Standing Hip Abduction with Unilateral Counter Support  - 1 x daily - 3 x weekly - 2 sets - 10 reps - 2 hold - Standing Hip Extension with Unilateral Counter Support  - 1 x daily - 3 x weekly - 2 sets - 10 reps - 2 hold - Supine Straight Leg Raises  - 1 x daily - 37 x weekly - 2 sets - 10 reps - Bridge  - 1 x daily - 3 x weekly - 2 sets - 10 reps - 2 hold - Supine Heel Slide  - 1 x daily - 3 x weekly - 2 sets - 10 reps  GOALS: Goals reviewed with patient? Yes  SHORT TERM GOALS: Target date: 09/19/23  Patient will be independent in starter HEP to improve A/ROM tolerance and preserve available ROM of LLE and to advance A/ROM and strength of RLE.  Baseline: 08/20/23: defer to vists 2-3  Goal status: NEW  LONG TERM GOALS: Target date: 10/20/23  Patient will increase FOTO score to equal to or greater than 65%  to demonstrate statistically significant improvement in ease of mobility.  Baseline:  47%; 06/23/23: 60;   7/16: 60%   10/31: 54 Goal status: Progressing   2.  Patient (> 17 years old) will complete five times sit to stand test in < 15 seconds indicating an increased RLE power.  Baseline: 5/7: unable to tolerate ; 7/16: 17.4sec 10/31: 11.02 Goal status: Progressing   3.   Patient to report tolerance of ad lib AMB in home during the day of distances >31ft >5x at supervision level or better to improve independence with ADL.    Baseline: 08/20/23: using transport chair for nearly all mobility, not tolerating walking due to WB limitations of LLE  10/31: able to  ambulate 30-58ft with CGA from PT.  Goal status: Progressing     ASSESSMENT:  CLINICAL IMPRESSION:   Pt presented to PT session motivated to participate. PT treatment focused on BLE strengthening. Mild increase crepitus in pain in L knee limits time in standing on this day, but tolerated all seated therex without increased pain.  The patient will benefit from skilled physical therapy to reduce pain, improve mobility, improve strength and improve quality of life.     OBJECTIVE IMPAIRMENTS: Abnormal gait, cardiopulmonary status  limiting activity, decreased activity tolerance, decreased balance, decreased cognition, decreased endurance, decreased knowledge of use of DME, decreased mobility, difficulty walking, decreased ROM, decreased strength, decreased safety awareness, hypomobility, increased fascial restrictions, impaired perceived functional ability, impaired flexibility, impaired UE functional use, improper body mechanics, postural dysfunction, and pain.   ACTIVITY LIMITATIONS: carrying, lifting, bending, sitting, standing, squatting, stairs, transfers, bed mobility, bathing, toileting, dressing, reach over head, hygiene/grooming, locomotion level, and caring for others  PARTICIPATION LIMITATIONS: meal prep, cleaning, laundry, medication management, personal finances, interpersonal relationship, driving, shopping, community activity, yard work, school, and church  PERSONAL FACTORS: Age, Behavior pattern, Education, Fitness, Past/current experiences, Time since onset of injury/illness/exacerbation, and 3+ comorbidities: anemia, arthritis, back pain, depression, hernia, GERD, HLD, HTN, hypothyroidism, knee pain, pancreatic hormone dysfunction, pneumonia, seizures, stroke, thyroid disease  are also affecting patient's functional outcome.   REHAB POTENTIAL: Fair    CLINICAL DECISION MAKING: Evolving/moderate complexity  EVALUATION COMPLEXITY: Moderate  PLAN:  PT FREQUENCY: 1-2x/week  PT  DURATION: 10 weeks  PLANNED INTERVENTIONS: Therapeutic exercises, Therapeutic activity, Neuromuscular re-education, Balance training, Gait training, Patient/Family education, Self Care, Joint mobilization, Joint manipulation, Stair training, Vestibular training, Canalith repositioning, Visual/preceptual remediation/compensation, Orthotic/Fit training, DME instructions, Electrical stimulation, Wheelchair mobility training, Spinal mobilization, Cryotherapy, Moist heat, Compression bandaging, scar mobilization, Splintting, Taping, Traction, Ultrasound, Manual therapy, and Re-evaluation  PLAN FOR NEXT SESSION:  Tolerance to standing in functional movement/ tasks.  Continue BLE strengthening in weighted and unweighted position   Golden Pop, PT, DPT 11/03/2023, 4:38 PM   4:38 PM, 11/03/23  Physical Therapist - [facility]  [phone] [facility])

## 2023-11-03 NOTE — Therapy (Signed)
Occupational Therapy Neuro Treatment Note    Patient Name: [name] MRN: [id number] DOB:07-26-1941, 82 y.o., female Today's Date: 11/03/2023  PCP: Dr. Judithann Sheen REFERRING PROVIDER:  Dr. Judithann Sheen  END OF SESSION:  PLAN:   OT End of Session - 11/03/23 1636     Visit Number 72    Number of Visits 96    Date for OT Re-Evaluation 01/21/24    Authorization Time Period Progress report period starting 12/16/2022    OT Start Time 1530    OT Stop Time 1615    OT Time Calculation (min) 45 min    Activity Tolerance Patient tolerated treatment well    Behavior During Therapy WFL for tasks assessed/performed                   Past Medical History:  Diagnosis Date   Anemia    Arthritis    Asthma    uses inhaler just prior to surgery to avoid attack   Back pain    from previous injury   Complication of anesthesia    has woken  up during 2 different surgery   Depression    no current issue/treatment; situation   Gallstones    GERD (gastroesophageal reflux disease)    Hiatal hernia    patient does NOT have nerve/muscle disease   History of kidney stones    HLD (hyperlipidemia)    HTN (hypertension)    Hypothyroidism    Kidney stones    Knee pain    Nausea and vomiting 10/15/2022   Non-diabetic pancreatic hormone dysfunction years   pt. 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   Pancreatitis    Pneumonia    Seizures (HCC)    caused by dye injected during a procedure   Shortness of breath    with exertion   Sinus problem    frequent infections/congestion   Stroke (HCC) 2021   reports having CVA in 2021 and having mini strokes before that   Thyroid disease    Past Surgical History:  Procedure Laterality Date   ABDOMINAL HYSTERECTOMY     APPENDECTOMY     CARPAL TUNNEL RELEASE  10+ years ago   bilateral   EYE SURGERY  3 yrs ago   bilateral cataracts   FOOT OSTEOTOMY  6 weeks ago   Left foot: great, 2nd & 3rd   FOOT OSTEOTOMY  5 years ago   Right great toe    HAND SURGERY Bilateral 2011-most recent   multiple hand surgeries, 2 on left, 3 on right   KNEE ARTHROPLASTY Right 04/28/2022   Procedure: COMPUTER ASSISTED TOTAL KNEE ARTHROPLASTY;  Surgeon: Donato Heinz, MD;  Location: [facility];  Service: Orthopedics;  Laterality: Right;   LOOP RECORDER INSERTION N/A 05/16/2020   Procedure: LOOP RECORDER INSERTION;  Surgeon: Marcina Millard, MD;  Location: [facility];  Service: Cardiovascular;  Laterality: N/A;   NASAL SINUS SURGERY  most recent 7-8 yrs ago   7 sinus surgeries    TRIGGER FINGER RELEASE  11/19/2011   Procedure: RELEASE TRIGGER FINGER/A-1 PULLEY;  Surgeon: Nicki Reaper, MD;  Location: [facility];  Service: Orthopedics;  Laterality: Right;  release a-1 pulley right index finger and cyst removal   WRIST GANGLION EXCISION  1980's   right   Patient Active Problem List   Diagnosis Date Noted   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 11/27/2022   Hypomagnesemia 11/27/2022   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  HLD (hyperlipidemia) 11/23/2022   Asthma 11/23/2022   Depression with anxiety 11/23/2022   Chronic diastolic CHF (congestive heart failure) (HCC) 11/23/2022   Normocytic anemia 11/23/2022   Aspiration pneumonia (HCC) 11/23/2022   AKI (acute kidney injury) (HCC) 11/23/2022   Abdominal pain 11/23/2022   Mesenteric mass 11/23/2022   Coffee ground emesis 11/23/2022   Nausea and vomiting 10/15/2022   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   Fatigue 09/17/2022   Left hemiparesis (HCC) 09/17/2022   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HCC) 08/22/2022   GERD (gastroesophageal reflux disease) 08/21/2022   Agitation 08/20/2022   Acute left-sided weakness 08/20/2022   Expressive aphasia    Stroke (HCC) 08/19/2022   Leukocytosis 08/19/2022   History of urticaria 04/28/2022   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   Primary osteoarthritis of left knee 02/24/2022   Primary osteoarthritis of right  knee 02/24/2022   Lumbar spondylolysis 04/12/2020   History of CVA (cerebrovascular accident) 03/26/2020   Low back pain radiating to right lower extremity 03/21/2020   B12 deficiency 03/06/2020   Positive anti-CCP test 12/21/2019   Arthralgia 12/13/2019   Dermatitis 12/13/2019   Rheumatoid factor positive 12/13/2019   Essential hypertension 12/11/2018   Palpitations 12/11/2018   Acquired hypothyroidism 11/10/2018   Arthritis of knee 09/17/2016   Anxiety 11/22/2014   Asthma without status asthmaticus 11/22/2014   Benign neoplasm of colon, unspecified 11/22/2014   Environmental allergies 11/22/2014   Hypertriglyceridemia 11/22/2014   Hypokalemia 11/22/2014   Personal history of disease of skin and subcutaneous tissue 11/22/2014   REFERRING DIAG: CVA   THERAPY DIAG:  Muscle weakness (generalized)   Other lack of coordination   Rationale for Evaluation and Treatment Rehabilitation   SUBJECTIVE:   SUBJECTIVE STATEMENT:  Pt. reports feeling good today  Pt accompanied by: significant other   PERTINENT HISTORY: Patient is an 82 year-old female who was admitted to [facility] on 11/23/2022 with a cerebellar CVA. 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 Pt. Was admitted to Inpatient Rehab from 12/21-1/02/2023. 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  Patient underwent inpatient rehabilitation for 2 weeks.  Patient was assessed and was scheduled for a knee replacement on 08/29/2022 however had to cancel it due to having had a CVA.  Patient had a recent fall 2 days after discharging from inpatient rehab. Past Medical History includes: Knee replacement, essential HTN, hypokalemia, leukocytosis, seizures, positive anti-- CCP test, anxiety disorder, mini strokes.  Patient had shingles with left eye nerve pain s/p 1 year ago.    PRECAUTIONS: Fall   WEIGHT BEARING RESTRICTIONS No   PAIN:  Are you having  pain?  No pain.   FALLS: Has patient fallen in last 6 months? Yes. Number of falls 1   LIVING ENVIRONMENT: Lives with: Lives with Spouse Lives in: House/apartment Stairs: 2 storey home, resides on the first floor.  External: 2 stairs front no rails, and 6 in back with rails Has following equipment at home: Single point cane, Walker - 2 wheeled, Environmental consultant - 4 wheeled, Shower bench, and bed side commode   PLOF: Independent   PATIENT GOALS  To Regain the use of her left arm   OBJECTIVE:    HAND DOMINANCE: Right   ADLs: Overall ADLs: Husband assists pt. as needed Transfers/ambulation related to ADLs:Pt. Uses a 3 wheeled walker with Husband assist. Eating: Pt. Is independent with the right hand. Pt. has difficulty cutting food. Grooming: Pt. is using her right hand, however has difficulty  sustaining her LUE in elevation to assist with haircare. UB Dressing: Pt. Is independent donning a pullover shirt, and button down shirt. Has difficulty with buttoning, LB Dressing:  Independent donning pants, and socks. Difficulty tying shoes. Toileting: Independent Bathing: Pt. Is able to engage her right hand. Tub Shower transfers: Supervision Equipment: See above for equipment    IADLs: Shopping:  Has not had the opportunity for grocery shopping yet Light housekeeping: Husband is assisting with light house keeping Meal Prep:  Dependent Community mobility: Relies of family/friends Medication management: Husband assisting with weekly pillbox set-up, and administering medication. Financial management: TBD Handwriting: 75% legible   MOBILITY STATUS: Hx of falls   POSTURE COMMENTS:  No Significant postural limitations Sitting balance: supported sitting balance WFL   ACTIVITY TOLERANCE: Activity tolerance:  Fatigues in greater than 30 min.    FUNCTIONAL OUTCOME MEASURES: FOTO: 57   UPPER EXTREMITY ROM      Active ROM Right Eval: WFL Left eval Left  01/22/23 Left  03/03/23 Left  04/14/23  Left  05/28/2023 Left 07/23/2023 Left 08/20/2023 Left 09/17/23 Left 10/29/23  Shoulder flexion   132 100 108 108 108 60(70) 70(78) 70(102) 82(110)  Shoulder abduction   80 85 85 85 85 65(78) 72(85) 73(94) 75(98)  Shoulder adduction              Shoulder extension              Shoulder internal rotation              Shoulder external rotation              Elbow flexion   140 140 WFL [facility] WFL  Elbow extension   WNL WNL [facility] WFL WFL  Wrist flexion   65 68         Wrist extension   -10 20 24  32 32 -10 20 20 26   Wrist ulnar deviation     12 10 14 14 10 18 18 20   Wrist radial deviation     8 14 14 16 12 14 14 14   Wrist pronation              Wrist supination              (Blank rows = not tested)   Left digit flexion to [facility]: 2nd: 0cm, 3rd: 0cm, 4th: 0cm, 5th: 0cm   Limited Left full 2nd digit extension     UPPER EXTREMITY MMT:      MMT Right Eval: 4+/5 overall Right 07/23/2023 Left Eval Left 01/22/23 Left 03/03/23 Left  04/14/2023 Left 05/28/2023 Left 07/23/2023 Left 08/20/2023 Left 09/17/23 Left  10/29/23  Shoulder flexion   3+/5 3/5 3-/5 3-/5 3-/5 3-/5 2/5 2+/5 2+/5 3-/5  Shoulder abduction   3+/5 3-/5 3-/5 3-/5 3-/5 3-/5 2/5 2+/5 2+/5 3-/5  Shoulder adduction               Shoulder extension               Shoulder internal rotation               Shoulder external rotation               Middle trapezius               Lower trapezius               Elbow flexion   4/5 3+/5 4/5  N/A 4/5 4/5 3+/5 3+/5 3+/5 3+/5  Elbow extension   4/5 3+/5 4/4 N/A 4/5 4/5 3+/5 3+/5 4-/5 4-/5  Wrist flexion               Wrist extension   4/5 2-/5 3-/5 3-/5 3/5 3+/5 3-/5 3+/5 3+/5 3+/5  Wrist ulnar deviation               Wrist radial deviation               Wrist pronation               Wrist supination               (Blank rows = not tested)   HAND FUNCTION: Grip strength: Right: 26#, Left: 10# Pinch strength: Right 8#, Left: 3#, 3 Pt. Pinch strength: Right: 9#,  L: 2#  01/22/2023 Grip strength: Right: 26#, Left: 12# Pinch strength:  Pinch meter used at the initial eval has been sent out for recalibration   03/03/2023 Grip strength: Right: 26#, Left: 13# Pinch strength:  Pinch meter used at the initial eval has been sent out for recalibration  04/14/2023: Grip strength: Right: 26#, Left: 13# Pinch strength:   Right 8#, Left: 4#, 3 Pt. Pinch strength: Right: 9#, L: 3#    05/28/2023: Grip strength: Right: 26#, Left: 13# Pinch strength:   Right 8#, Left: 2#, 3 Pt. Pinch strength: Right: 9#, L: 4#  07/23/2023  Grip strength: Right: 5#, Left: 2# Pinch strength:   Right 6#, Left: 2#, 3 Pt. Pinch strength: Right: 5#, L: 2#  08/20/2023:    Grip strength: Right: 8#, Left: 4# Pinch strength:   Right 6#, Left: 2#, 3 Pt. Pinch strength: Right: 5#, L: 2#    09/17/23:  Grip strength: Right: 5#, Left: 4# Pinch strength:   Right 6#, Left: 2#, 3 Pt. Pinch strength: Right: 5#, L: 2#  10/28/2024  Grip strength: Right: 13#, Left: 6# Pinch strength:   Right 6#, Left: 2#, 3 Pt. Pinch strength: Right: 5#, L: 2#     COORDINATION: Right: 22 sec., Left: <5 min. To place 7 pegs with increased compensation proximally in the trunk, and through reflexive associated reactions.  01/22/23 Right: 22 sec., Left: 3 min. & 4 sec.  03/03/23 Right: 22 sec., Left: 1 min. & 39 sec.  04/14/23   TBD  05/28/23 Right: 22 sec., Left:  5 min.  07/23/2023  Right: 33 sec., Left:  Pt. Is unable to grasp, and place pegs into the pegboard. Pt. Was able to remove 9 vertical pegs in 34 sec.  08/20/2023:  Right: 27 sec., Left:  5 pegs placed in 5 min.; Pt. Was able to remove 9 vertical pegs in 28 sec.  09/17/23:  Right: 26 sec., Left:  3 min, & 49 sec.    10/28/2024:  Right: 26 sec., Left:  2 min, & 10 sec.        SENSATION: Light touch: WFL, proprioceptive awareness: Intact   EDEMA: N/A   MUSCLE TONE: LUE: Hypotonic   COGNITION: Overall cognitive status: WFL  for tasks assessed. Pt. Is impulsive at times.   VISION: Subjective report: Pt. report having shingles affecting left eye  s/p 1 year. Has nerve pain Baseline vision: Wears glasses for reading only Visual history:  updated see clinical impression   VISION ASSESSMENT:    WFL for tasks performed   PERCEPTION: Intact   PRAXIS: Impaired: Motor planning   OBSERVATIONS:  Pt. more alert,  and engaging since prior to the most recent hospitalization.    TODAY'S TREATMENT:     There. Ex.:   Pt. tolerated PROM/AAROM to the Left shoulder, elbow, wrist, and digits. Pt. worked on [employer], and reciprocal motion using the UBE while seated for 8 min. with minimal resistance.  Constant monitoring was provided.    Neuromuscular re-education:   Pt. worked on [facility] skills grasping 1" stick on the [employer]. Pt. worked on storing the objects in the palm, and translatory skills moving the items from the palm of the hand to the tip of the 2nd digit, and thumb. Pt. worked on removing the pegs using bilateral alternating hand patterns. Pt. Worked on constructing a 3 piece tower with  1" sticks, 1/4" washers, and 1/4" collars.           PATIENT EDUCATION: Education details: LUE therapeutic exercise Person educated: Patient and Spouse Education method: Medical illustrator Education comprehension: verbalized understanding, returned demonstration, and needs further education   HOME EXERCISE PROGRAM:   Reviewed activities at home to promote isolated 2nd digit extension.    GOALS: Goals reviewed with patient? Yes   SHORT TERM GOALS: Target date: 12/10/2023   1. Patient will be independent with home exercise program for the left upper extremity Baseline: 10/29/2023: Independent 09/17/23: Independent 08/20/23: Pt. continues to require assist from he husband for HEPs 07/23/2023: Pt. continues to require assist. 05/28/2023: Pt. Requires assist 04/09/2023: Pt. Continues to consistently  attempt to engage her hand at hand 02/01/2023: Pt. Consistently attempts to perform HEPs independently. No current home exercise program Goal status: Ongoing   LONG TERM GOALS: Target date:  01/21/2024  1.  Patient will improve left shoulder strength by 2 mm grades to be able to sustain UE's in elevation long enough to wash her hair.  Baseline: 10/29/2023: Left shoulder flexion 3-/5, abduction: 3-/5 10/27/2023:  Pt. Is improving with reaching up to reach her hair for hair care. Pt. Continues to have difficulty sustaining the UE in elevation long enough to perform hair care. 09/17/2023: Left shoulder 2+/5, abduction 2+/5 08/20/2023: Left shoulder flexion 2+/5, abduction 2+/5  07/23/2023: Left shoulder flexion: 2/5, abduction: 2/5. 05/28/2023: Left shoulder flexion: 3-/5, abduction: 3-/5. Pt. has an old left shoulder injury limiting progression with left shoulder strength 05/26/2023: Pt. continues to present with limitiations in sustaining LUE elevation long enough to perform hair care thoroughly 04/09/2023: Pt. is limited with sustaining LUE elevation long enough to perform hair care thoroughly. 03/03/2023: Left shoulder flexion: 3-/5, abduction: 3-/5 01/22/2023: Left shoulder flexion: 3-/5, abduction: 3-/5 Eval: Left shoulder flexion: 3/5, abduction: 3-/5 Goal status: Ongoing   2.  Patient will improve left shoulder active abduction to be able to comb her hair Baseline: 10/29/2023: Left shoulder abduction: 75(98) 10/27/2023: Pt. Is improving with reaching the left side of her hair for haircare. Pt. Has difficulty reaching with the left side to the back, and right side of her head.09/17/2023: 73(94) Pt. is able to reach her left side, and back of hair. 9/11/09/2023: 72(85) Pt. is able to reach her left side, and back of hair. 07/23/2023: Left shoulder flexion: 60(70) abduction: 65(78) 05/28/2023: left shoulder flexion: 108 abduction: 85 Pt. Has an old left shoulder injury limiting progression with left shoulder ROM  05/26/2023:  5/10 left shoulder pain with ROM limits using it functionally during hair care. 04/09/2023: Pt. Presents with difficulty abducting her left shoulder enough to thoroughly complete haircare. 03/03/2023: Shoulder abduction: 85 01/22/2023: Shoulder abduction: 85 Eval: Left shoulder  abduction is 80(108) Goal status: Ongoing   3.  Patient will independently button her shirt with modified independence. Baseline:10/29/2023: Pt. Has difficulty manipulating buttons, will benefit from a buttonhook.10/27/23: Pt. Requires increased time to complete buttons.09/17/2023: Pt. Has a buttonhook, however does not know where it is. Pt. requires increased time to complete, and assist from her husband. 08/20/2023: Pt. requires increased time to complete, and assist from her husband. 07/23/2023 Pt. presents with difficulty buttoning her shirt. 05/28/2023: pt. Continues to work towards progressing with buttoning 05/26/2023:  Pt. Continues to progress towards buttoning. 04/09/2023: Pt. Continues to progress towards buttoning. 03/03/2023: Pt. Continues to have difficulty with buttoning. 01/22/2023: Pt. continues to have difficulty. Eval: Patient has difficulty.  Goal status: Ongoing   4.  Patient well improve left grip strength in preparatio for securely hold a glass/beverage Baseline: 10/29/2023: Grip strength: Right: 13#  Left: 6#10/27/2023: Pt. Has difficulty securely holding heavier items with larger diameter. 09/17/2023: Grip strength: Right: 5#, Left: 4# Pt. Has difficulty holding a glass. Right: 8#, Left: 4# Pt. Is able to more securely hold mirrors, and flowers with the left hand, however has difficulty holding a glass beverage, or bottle. 08/20/2023: Right: 8#, Left: 4# Pt. Is able to more securely hold mirrors, and flowers with the left hand, however has difficulty holding a glass beverage, or bottle. 07/23/2023: Grip strength: Right: 5#, Left: 2# 05/28/2023: Right: 26#, Left: 13#  pt. Presents with difficulty securely  holding flowers in her left hand. 05/26/2023: TBD 04/09/2023: Pt. Is able to hold, and hike pants with the left hand, continues to have difficulty with securely holding flowers.   03/03/2023: left grip strength: 13# 01/22/2023: Left: 12# Eval: Pt. Is unable to securely hold flowers. Goal status: Ongoing   5.  Pt. will independently recall adaptive  strategies for performing ADL/ADL kitchen tasks.  Baseline:10/29/2023: Continue 11/19/204: Continue 08/20/2023: Revised to include IADL kitchen tasks.  07/23/2023: Continue 05/28/2023: Pt. Needs continued education about adaptive strategies.05/26/2023: Continue 04/09/2023: Continue3/26/2024: Continue 01/22/2023: Pt. continues to benefit from education about adaptive strategies during ADLs, and IADLs. Eval: Pt. to be provided with adaptive strategies. Goal status: Revised   6.  Pt. will improve FOTO score by 2 points to reflect patient perceived performance improvement assessment specific ADLs  and IADLs Baseline: 64/40/3474: 66 10/27/2023: TBD 09/17/2023: FOTO score: 61 07/23/2023: FOTO score: 57 05/28/2023: FOTO score 59 05/26/2023: TBD 5/02/204:  TBD 03/03/23: FOTO 62 Eval: 57 Goal status: Ongoing  7.  Pt. will improve left hand coordination skills in order to be able to  manipulate small objects.     Baseline: 10/29/2023 Left: 2 min. & 10 sec. 10/27/2023: Pt. Is improving with manipulating small objects.09/17/2023: Right: 26 sec., Left:  3 min, & 49 sec. pegs placed in 5 min.; Pt. Was able to remove 9 vertical pegs in 24 sec. 08/20/2023: Right: 27 sec., Left:  5 pegs placed in 5 min.; Pt. Was able to remove 9 vertical pegs in 28 sec. Pt. Is able to hold, and sort utensils in a drawer. Pt. Has difficulty manipulating small objects. 07/23/2023: Right: 33 sec., Left:  Pt. Is unable to grasp, and place pegs into the pegboard. Pt. Was able to remove 9 vertical pegs in 34 sec. 05/28/2023: Left:  5 min. 05/26/2023 Pt. Continues to present with difficulty handling and sorting  utensils. 04/14/2023: 56 04/09/2023: TBD 03/03/23: 1 min. & 39 01/22/2022: Left: 3 min. & 4 sec. Eval: Pt. has difficulty sorting, and placing utensils with the left hand. Left  FMC : >5 min. For 7 pegs on the 9 hole peg test.    Goal Status: Ongoing  8. Pt. will improve active left 2nd digit extension to be able able to isolate her 2nd digit in preparation for pressing/pushing buttons on appliances, phones, or remotes. Baseline: 10/29/2023: Pt. Is improving with 2nd digit isolation, to however continues to present with limited prolonged 2nd digit extension11/19/2024: Pt. Is improving with 2nd digit isolation, to however presents with limited prolonged 2nd digit extension. 09/17/2023: Pt. Presents with limited isolated 2nd digit extension with increased time. 08/20/2023: Pt. Is more consistently able to isolate 2nd digit extension with increased time. 07/23/2023: Pt. Continues to work on consistency with isolating left 2nd digit extension 05/28/2023: Pt. Continues to improve with left 2nd digit extension 05/26/2023: Improving with left 2nd digit extension. 04/09/2023: Pt. Is progressing with isolating left 2nd digit extension, however continues to present with limited increased flexor tone. 03/03/23: Pt. Continues to work on improving consistency with 2nd digit extension to press the remote. 01/22/2023: is able to perform full digit extension, although 2nd digit is slow to extend s 2/2 flexor tone. Pt.  Eval: Pt. is able to is unable to actively perform full digit extension Goal status: Ongoing  9: Pt. Will cut food with modified independence Baseline: 10/29/2023: : Pt. Continues to have difficulty cutting food on her plate.10/27/2023: Pt. Continues to have difficulty cutting food on her plate. 09/17/2023: Pt. Is unable to cut the food on her plate, as well as cut food for meal preparation. 08/20/2023: Pt. Is unable to cut the food on her plate, as well as cut food for meal preparation.    Goal Status: Ongoing    ASSESSMENT:   CLINICAL IMPRESSION:  Pt. tolerated the BUE exercises today, as well as the UBE with no reports of pain. Pt. is improving with [facility] skills grasping the small objects, and turning them from a horizontal position to a vertical position in preparation for placing them into the board, however requires cues at times. Pt. requires increased time to complete [facility] tasks, and requires cues for visual demonstration of proper technique. Pt. continues to work on improving LUE ROM, strength, FMC, and hand function skills in order to work towards improving, and maximizing independence with ADLs, and IADL tasks.   PERFORMANCE DEFICITS in functional skills including ADLs, IADLs, coordination, proprioception, ROM, strength, FMC, and GMC, cognitive skills including memory, and psychosocial skills including coping strategies, environmental adaptation, interpersonal interactions, and routines and behaviors.    IMPAIRMENTS are limiting patient from ADLs, IADLs, education, leisure, and social participation.    COMORBIDITIES may have co-morbidities  that affects occupational performance. Patient will benefit from skilled OT to address above impairments and improve overall function.   MODIFICATION OR ASSISTANCE TO COMPLETE EVALUATION: Min-Moderate modification of tasks or assist with assess necessary to complete an evaluation.   OT OCCUPATIONAL PROFILE AND HISTORY: Detailed assessment: Review of records and additional review of physical, cognitive, psychosocial history related to current functional performance.   CLINICAL DECISION MAKING: Moderate - several treatment options, min-mod task modification necessary   REHAB POTENTIAL: Good   EVALUATION COMPLEXITY: Moderate      PLAN: OT FREQUENCY: 2x/week   OT DURATION: 12 weeks   PLANNED INTERVENTIONS: self care/ADL training, therapeutic exercise, therapeutic activity, neuromuscular re-education, manual therapy, passive range of motion, functional  mobility training, electrical stimulation, and paraffin   RECOMMENDED OTHER SERVICES: PT   CONSULTED AND AGREED WITH PLAN OF CARE: Patient and family member/caregiver  PLAN FOR NEXT SESSION: see above    Olegario Messier, MS, OTR/L  10/29/2023

## 2023-11-10 ENCOUNTER — Ambulatory Visit: Payer: Medicare HMO | Admitting: Physical Therapy

## 2023-11-10 ENCOUNTER — Ambulatory Visit: Payer: Medicare HMO | Attending: Physical Medicine and Rehabilitation | Admitting: Occupational Therapy

## 2023-11-10 ENCOUNTER — Encounter: Payer: Self-pay | Admitting: Occupational Therapy

## 2023-11-10 DIAGNOSIS — R278 Other lack of coordination: Secondary | ICD-10-CM | POA: Diagnosis not present

## 2023-11-10 DIAGNOSIS — M6281 Muscle weakness (generalized): Secondary | ICD-10-CM

## 2023-11-10 DIAGNOSIS — R531 Weakness: Secondary | ICD-10-CM | POA: Insufficient documentation

## 2023-11-10 DIAGNOSIS — R262 Difficulty in walking, not elsewhere classified: Secondary | ICD-10-CM | POA: Diagnosis not present

## 2023-11-10 DIAGNOSIS — R2681 Unsteadiness on feet: Secondary | ICD-10-CM | POA: Diagnosis not present

## 2023-11-10 NOTE — Therapy (Unsigned)
OUTPATIENT PHYSICAL THERAPY TREATMENT/     Patient Name: [name] MRN: [id number] DOB:03-14-1941, 82 y.o., female Today's Date: 11/10/2023   PCP: Aram Beecham, D MD REFERRING PROVIDER: Jacquelynn Cree PA   END OF SESSION:  PT End of Session - 11/10/23 1610     Visit Number 17    Number of Visits 31    Date for PT Re-Evaluation 12/24/23    Authorization Type [employer]:    Authorization Time Period approved for 24 additional tx from 09/28/23 to 12/24/23; total visit count 31    Authorization - Number of Visits 24    Progress Note Due on Visit 10    PT Start Time 1615    PT Stop Time 1700    PT Time Calculation (min) 45 min    Equipment Utilized During Treatment Gait belt    Activity Tolerance Patient tolerated treatment well    Behavior During Therapy WFL for tasks assessed/performed                 Past Medical History:  Diagnosis Date   Anemia    Arthritis    Asthma    uses inhaler just prior to surgery to avoid attack   Back pain    from previous injury   Complication of anesthesia    has woken  up during 2 different surgery   Depression    no current issue/treatment; situation   Gallstones    GERD (gastroesophageal reflux disease)    Hiatal hernia    patient does NOT have nerve/muscle disease   History of kidney stones    HLD (hyperlipidemia)    HTN (hypertension)    Hypothyroidism    Kidney stones    Knee pain    Nausea and vomiting 10/15/2022   Non-diabetic pancreatic hormone dysfunction years   pt. 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   Pancreatitis    Pneumonia    Seizures (HCC)    caused by dye injected during a procedure   Shortness of breath    with exertion   Sinus problem    frequent infections/congestion   Stroke (HCC) 2021   reports having CVA in 2021 and having mini strokes before that   Thyroid disease    Past Surgical History:  Procedure Laterality Date   ABDOMINAL HYSTERECTOMY     APPENDECTOMY     CARPAL  TUNNEL RELEASE  10+ years ago   bilateral   EYE SURGERY  3 yrs ago   bilateral cataracts   FOOT OSTEOTOMY  6 weeks ago   Left foot: great, 2nd & 3rd   FOOT OSTEOTOMY  5 years ago   Right great toe   HAND SURGERY Bilateral 2011-most recent   multiple hand surgeries, 2 on left, 3 on right   KNEE ARTHROPLASTY Right 04/28/2022   Procedure: COMPUTER ASSISTED TOTAL KNEE ARTHROPLASTY;  Surgeon: Donato Heinz, MD;  Location: [facility];  Service: Orthopedics;  Laterality: Right;   LOOP RECORDER INSERTION N/A 05/16/2020   Procedure: LOOP RECORDER INSERTION;  Surgeon: Marcina Millard, MD;  Location: [facility];  Service: Cardiovascular;  Laterality: N/A;   NASAL SINUS SURGERY  most recent 7-8 yrs ago   7 sinus surgeries    TRIGGER FINGER RELEASE  11/19/2011   Procedure: RELEASE TRIGGER FINGER/A-1 PULLEY;  Surgeon: Nicki Reaper, MD;  Location: [facility];  Service: Orthopedics;  Laterality: Right;  release a-1 pulley right index finger and cyst removal  WRIST GANGLION EXCISION  1980's   right     ONSET DATE: 11/23/22  REFERRING DIAG: TIA  THERAPY DIAG:  Muscle weakness (generalized)  Other lack of coordination  Unsteadiness on feet  Difficulty in walking, not elsewhere classified  Acute left-sided weakness  Rationale for Evaluation and Treatment: Rehabilitation  SUBJECTIVE:                                                                                                                                                                                             SUBJECTIVE STATEMENT:   Pt reports that she is doing well. No new updates, but reports that she did not sleep well last night.    Pt accompanied by: significant other  PERTINENT HISTORY: [name] is an 51 year-old female who was admitted to [facility] on 11/23/2022 with a cerebellar CVA. 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 Pt. Was admitted to Inpatient Rehab  from 12/21-1/02/2023. 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  Patient underwent inpatient rehabilitation for 2 weeks.  Patient was assessed and was scheduled for a knee replacement on 08/29/2022 however had to cancel it due to having had a CVA. PMH includes: anemia, arthritis, back pain, depression, hernia, GERD, HLD, HTN, hypothyroidism, knee pain, pancreatic hormone dysfunction, pneumonia, seizures, stroke, thyroid disease.  Patient has a donjoy brace for L knee.   PAIN:  Are you having pain? no  PRECAUTIONS: Fall, has latex allergy  WEIGHT BEARING RESTRICTIONS: No  FALLS: Has patient fallen in last 6 months? Yes. Number of falls 4 falls   LIVING ENVIRONMENT: Lives with: lives with their family and lives with their spouse Lives in: House/apartment Stairs: Yes: Internal: flight steps;   and External: 2 steps; on right going up and on left going up Has following equipment at home: Single point cane, Walker - 2 wheeled, Shower bench, and bed side commode  PLOF: Independent  PATIENT GOALS: to have less pain and move better  OBJECTIVE:   TODAY'S TREATMENT:  DATE: 11/10/23  Stand pivot transfer to and from arm chair with CGA and min cues for safe gait pattern to reduce fall risk   Seated therex:  LAQ 3#AW x 12  Ankle PF with RTB x 15  SLR with hip abduction/adduction x12 HS curl x 12 bil RTB  Hip flexion 3# AW x 12  Hip abduction RTB x 12   Pt reports that at home she has started to feel dizzy when sitting up or going to lie down in bed again. S/s last only 30-60 sec. States that s/s resolve faster if she lays on the R side. But are worse if she rolls to the L.  PT performed vestibular assessment.  No s/s with horizontla VOR or VOR cancellation or smooth pursuits.   Mild nystagmus with head impulse test from  L to neutral. S/s resolved in <10 sec  Mild s/s but no nystagmus Vertical VOR and VOR cancellation    Gait with Standing therex.  Reciprocal march 2 x 8  Hip abduction x 8 LLE, x 3 RLE; RLE stopped due to pain In in the L knee.   Ambulatory transfer to nustep x 20 ft with CGA for safety and min cues for AD management in turn. BUE/BLE reciprocal movement training x 4 min level 1-2 with cues for pain free ROM in the LLE.   PATIENT EDUCATION: Education details: Pt educated throughout session about proper posture and technique with exercises. Improved exercise technique, movement at target joints, use of target muscles after min to mod verbal, visual, tactile cues.   HOME EXERCISE PROGRAM:  Access Code: [email or web address] URL: [email or web address] Date: 10/07/2023 Prepared by: Grier Rocher  Exercises - Standing March with Counter Support  - 1 x daily - 3 x weekly - 2 sets - 10 reps - Standing Hip Abduction with Unilateral Counter Support  - 1 x daily - 3 x weekly - 2 sets - 10 reps - 2 hold - Standing Hip Extension with Unilateral Counter Support  - 1 x daily - 3 x weekly - 2 sets - 10 reps - 2 hold - Supine Straight Leg Raises  - 1 x daily - 37 x weekly - 2 sets - 10 reps - Bridge  - 1 x daily - 3 x weekly - 2 sets - 10 reps - 2 hold - Supine Heel Slide  - 1 x daily - 3 x weekly - 2 sets - 10 reps  GOALS: Goals reviewed with patient? Yes  SHORT TERM GOALS: Target date: 09/19/23  Patient will be independent in starter HEP to improve A/ROM tolerance and preserve available ROM of LLE and to advance A/ROM and strength of RLE.  Baseline: 08/20/23: defer to vists 2-3  Goal status: NEW  LONG TERM GOALS: Target date: 10/20/23  Patient will increase FOTO score to equal to or greater than 65%  to demonstrate statistically significant improvement in ease of mobility.  Baseline:  47%; 06/23/23: 60;   7/16: 60%   10/31: 54 Goal status: Progressing   2.  Patient (> 25 years old) will  complete five times sit to stand test in < 15 seconds indicating an increased RLE power.  Baseline: 5/7: unable to tolerate ; 7/16: 17.4sec 10/31: 11.02 Goal status: Progressing   3.   Patient to report tolerance of ad lib AMB in home during the day of distances >66ft >5x at supervision level or better to improve independence with ADL.    Baseline: 08/20/23: using transport chair for nearly  all mobility, not tolerating walking due to WB limitations of LLE  10/31: able to ambulate 30-66ft with CGA from PT.  Goal status: Progressing     ASSESSMENT:  CLINICAL IMPRESSION:   Pt presented to PT session motivated to participate. PT focused on improve isolated LLE movement. Noted to have improved activation in all muscle groups in all planes but pain limits SLS therex on the LLE. Gait with RW continues to improve with cues for full foot contact on  with LLE and improved attention to L visual field to reduce fall risk.  The patient will benefit from skilled physical therapy to reduce pain, improve mobility, improve strength and improve quality of life.     OBJECTIVE IMPAIRMENTS: Abnormal gait, cardiopulmonary status limiting activity, decreased activity tolerance, decreased balance, decreased cognition, decreased endurance, decreased knowledge of use of DME, decreased mobility, difficulty walking, decreased ROM, decreased strength, decreased safety awareness, hypomobility, increased fascial restrictions, impaired perceived functional ability, impaired flexibility, impaired UE functional use, improper body mechanics, postural dysfunction, and pain.   ACTIVITY LIMITATIONS: carrying, lifting, bending, sitting, standing, squatting, stairs, transfers, bed mobility, bathing, toileting, dressing, reach over head, hygiene/grooming, locomotion level, and caring for others  PARTICIPATION LIMITATIONS: meal prep, cleaning, laundry, medication management, personal finances, interpersonal relationship, driving,  shopping, community activity, yard work, school, and church  PERSONAL FACTORS: Age, Behavior pattern, Education, Fitness, Past/current experiences, Time since onset of injury/illness/exacerbation, and 3+ comorbidities: anemia, arthritis, back pain, depression, hernia, GERD, HLD, HTN, hypothyroidism, knee pain, pancreatic hormone dysfunction, pneumonia, seizures, stroke, thyroid disease  are also affecting patient's functional outcome.   REHAB POTENTIAL: Fair    CLINICAL DECISION MAKING: Evolving/moderate complexity  EVALUATION COMPLEXITY: Moderate  PLAN:  PT FREQUENCY: 1-2x/week  PT DURATION: 10 weeks  PLANNED INTERVENTIONS: Therapeutic exercises, Therapeutic activity, Neuromuscular re-education, Balance training, Gait training, Patient/Family education, Self Care, Joint mobilization, Joint manipulation, Stair training, Vestibular training, Canalith repositioning, Visual/preceptual remediation/compensation, Orthotic/Fit training, DME instructions, Electrical stimulation, Wheelchair mobility training, Spinal mobilization, Cryotherapy, Moist heat, Compression bandaging, scar mobilization, Splintting, Taping, Traction, Ultrasound, Manual therapy, and Re-evaluation  PLAN FOR NEXT SESSION:  Tolerance to standing in functional movement/ tasks.  Continue BLE strengthening in weighted and unweighted position   Golden Pop, PT, DPT 11/10/2023, 4:11 PM   4:11 PM, 11/10/23  Physical Therapist - [facility]  [phone] [facility])

## 2023-11-10 NOTE — Therapy (Signed)
Occupational Therapy Neuro Treatment Note    Patient Name: [name] MRN: [id number] DOB:April 05, 1941, 82 y.o., female Today's Date: 11/10/2023  PCP: Dr. Judithann Sheen REFERRING PROVIDER:  Dr. Judithann Sheen  END OF SESSION:  PLAN:   OT End of Session - 11/10/23 1535     Visit Number [id number]    Number of Visits 96    Date for OT Re-Evaluation 01/21/24    Authorization Time Period Progress report period starting 12/16/2022    OT Start Time 1535    OT Stop Time 1615    OT Time Calculation (min) 40 min    Activity Tolerance Patient tolerated treatment well    Behavior During Therapy WFL for tasks assessed/performed                   Past Medical History:  Diagnosis Date   Anemia    Arthritis    Asthma    uses inhaler just prior to surgery to avoid attack   Back pain    from previous injury   Complication of anesthesia    has woken  up during 2 different surgery   Depression    no current issue/treatment; situation   Gallstones    GERD (gastroesophageal reflux disease)    Hiatal hernia    patient does NOT have nerve/muscle disease   History of kidney stones    HLD (hyperlipidemia)    HTN (hypertension)    Hypothyroidism    Kidney stones    Knee pain    Nausea and vomiting 10/15/2022   Non-diabetic pancreatic hormone dysfunction years   pt. 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   Pancreatitis    Pneumonia    Seizures (HCC)    caused by dye injected during a procedure   Shortness of breath    with exertion   Sinus problem    frequent infections/congestion   Stroke (HCC) 2021   reports having CVA in 2021 and having mini strokes before that   Thyroid disease    Past Surgical History:  Procedure Laterality Date   ABDOMINAL HYSTERECTOMY     APPENDECTOMY     CARPAL TUNNEL RELEASE  10+ years ago   bilateral   EYE SURGERY  3 yrs ago   bilateral cataracts   FOOT OSTEOTOMY  6 weeks ago   Left foot: great, 2nd & 3rd   FOOT OSTEOTOMY  5 years ago   Right great toe    HAND SURGERY Bilateral 2011-most recent   multiple hand surgeries, 2 on left, 3 on right   KNEE ARTHROPLASTY Right 04/28/2022   Procedure: COMPUTER ASSISTED TOTAL KNEE ARTHROPLASTY;  Surgeon: Donato Heinz, MD;  Location: [facility];  Service: Orthopedics;  Laterality: Right;   LOOP RECORDER INSERTION N/A 05/16/2020   Procedure: LOOP RECORDER INSERTION;  Surgeon: Marcina Millard, MD;  Location: [facility];  Service: Cardiovascular;  Laterality: N/A;   NASAL SINUS SURGERY  most recent 7-8 yrs ago   7 sinus surgeries    TRIGGER FINGER RELEASE  11/19/2011   Procedure: RELEASE TRIGGER FINGER/A-1 PULLEY;  Surgeon: Nicki Reaper, MD;  Location: [facility];  Service: Orthopedics;  Laterality: Right;  release a-1 pulley right index finger and cyst removal   WRIST GANGLION EXCISION  1980's   right   Patient Active Problem List   Diagnosis Date Noted   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 11/27/2022   Hypomagnesemia 11/27/2022   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  HLD (hyperlipidemia) 11/23/2022   Asthma 11/23/2022   Depression with anxiety 11/23/2022   Chronic diastolic CHF (congestive heart failure) (HCC) 11/23/2022   Normocytic anemia 11/23/2022   Aspiration pneumonia (HCC) 11/23/2022   AKI (acute kidney injury) (HCC) 11/23/2022   Abdominal pain 11/23/2022   Mesenteric mass 11/23/2022   Coffee ground emesis 11/23/2022   Nausea and vomiting 10/15/2022   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   Fatigue 09/17/2022   Left hemiparesis (HCC) 09/17/2022   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HCC) 08/22/2022   GERD (gastroesophageal reflux disease) 08/21/2022   Agitation 08/20/2022   Acute left-sided weakness 08/20/2022   Expressive aphasia    Stroke (HCC) 08/19/2022   Leukocytosis 08/19/2022   History of urticaria 04/28/2022   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   Primary osteoarthritis of left knee 02/24/2022   Primary osteoarthritis of right  knee 02/24/2022   Lumbar spondylolysis 04/12/2020   History of CVA (cerebrovascular accident) 03/26/2020   Low back pain radiating to right lower extremity 03/21/2020   B12 deficiency 03/06/2020   Positive anti-CCP test 12/21/2019   Arthralgia 12/13/2019   Dermatitis 12/13/2019   Rheumatoid factor positive 12/13/2019   Essential hypertension 12/11/2018   Palpitations 12/11/2018   Acquired hypothyroidism 11/10/2018   Arthritis of knee 09/17/2016   Anxiety 11/22/2014   Asthma without status asthmaticus 11/22/2014   Benign neoplasm of colon, unspecified 11/22/2014   Environmental allergies 11/22/2014   Hypertriglyceridemia 11/22/2014   Hypokalemia 11/22/2014   Personal history of disease of skin and subcutaneous tissue 11/22/2014   REFERRING DIAG: CVA   THERAPY DIAG:  Muscle weakness (generalized)   Other lack of coordination   Rationale for Evaluation and Treatment Rehabilitation   SUBJECTIVE:   SUBJECTIVE STATEMENT:  Pt. reports she hasn't been sleeping at night well.  Pt accompanied by: significant other   PERTINENT HISTORY: Patient is an 82 year-old female who was admitted to [facility] on 11/23/2022 with a cerebellar CVA. 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 Pt. Was admitted to Inpatient Rehab from 12/21-1/02/2023. 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  Patient underwent inpatient rehabilitation for 2 weeks.  Patient was assessed and was scheduled for a knee replacement on 08/29/2022 however had to cancel it due to having had a CVA.  Patient had a recent fall 2 days after discharging from inpatient rehab. Past Medical History includes: Knee replacement, essential HTN, hypokalemia, leukocytosis, seizures, positive anti-- CCP test, anxiety disorder, mini strokes.  Patient had shingles with left eye nerve pain s/p 1 year ago.    PRECAUTIONS: Fall   WEIGHT BEARING RESTRICTIONS No    PAIN:  Are you having pain?  No pain.   FALLS: Has patient fallen in last 6 months? Yes. Number of falls 1   LIVING ENVIRONMENT: Lives with: Lives with Spouse Lives in: House/apartment Stairs: 2 storey home, resides on the first floor.  External: 2 stairs front no rails, and 6 in back with rails Has following equipment at home: Single point cane, Walker - 2 wheeled, Environmental consultant - 4 wheeled, Shower bench, and bed side commode   PLOF: Independent   PATIENT GOALS  To Regain the use of her left arm   OBJECTIVE:    HAND DOMINANCE: Right   ADLs: Overall ADLs: Husband assists pt. as needed Transfers/ambulation related to ADLs:Pt. Uses a 3 wheeled walker with Husband assist. Eating: Pt. Is independent with the right hand. Pt. has difficulty cutting food. Grooming: Pt. is using her right  hand, however has difficulty sustaining her LUE in elevation to assist with haircare. UB Dressing: Pt. Is independent donning a pullover shirt, and button down shirt. Has difficulty with buttoning, LB Dressing:  Independent donning pants, and socks. Difficulty tying shoes. Toileting: Independent Bathing: Pt. Is able to engage her right hand. Tub Shower transfers: Supervision Equipment: See above for equipment    IADLs: Shopping:  Has not had the opportunity for grocery shopping yet Light housekeeping: Husband is assisting with light house keeping Meal Prep:  Dependent Community mobility: Relies of family/friends Medication management: Husband assisting with weekly pillbox set-up, and administering medication. Financial management: TBD Handwriting: 75% legible   MOBILITY STATUS: Hx of falls   POSTURE COMMENTS:  No Significant postural limitations Sitting balance: supported sitting balance WFL   ACTIVITY TOLERANCE: Activity tolerance:  Fatigues in greater than 30 min.    FUNCTIONAL OUTCOME MEASURES: FOTO: 57   UPPER EXTREMITY ROM      Active ROM Right Eval: WFL Left eval Left  01/22/23 Left   03/03/23 Left  04/14/23 Left  05/28/2023 Left 07/23/2023 Left 08/20/2023 Left 09/17/23 Left 10/29/23  Shoulder flexion   132 100 108 108 108 60(70) 70(78) 70(102) 82(110)  Shoulder abduction   80 85 85 85 85 65(78) 72(85) 73(94) 75(98)  Shoulder adduction              Shoulder extension              Shoulder internal rotation              Shoulder external rotation              Elbow flexion   140 140 WFL [facility] WFL  Elbow extension   WNL WNL [facility] WFL WFL  Wrist flexion   65 68         Wrist extension   -10 20 24  32 32 -10 20 20 26   Wrist ulnar deviation     12 10 14 14 10 18 18 20   Wrist radial deviation     8 14 14 16 12 14 14 14   Wrist pronation              Wrist supination              (Blank rows = not tested)   Left digit flexion to [facility]: 2nd: 0cm, 3rd: 0cm, 4th: 0cm, 5th: 0cm   Limited Left full 2nd digit extension     UPPER EXTREMITY MMT:      MMT Right Eval: 4+/5 overall Right 07/23/2023 Left Eval Left 01/22/23 Left 03/03/23 Left  04/14/2023 Left 05/28/2023 Left 07/23/2023 Left 08/20/2023 Left 09/17/23 Left  10/29/23  Shoulder flexion   3+/5 3/5 3-/5 3-/5 3-/5 3-/5 2/5 2+/5 2+/5 3-/5  Shoulder abduction   3+/5 3-/5 3-/5 3-/5 3-/5 3-/5 2/5 2+/5 2+/5 3-/5  Shoulder adduction               Shoulder extension               Shoulder internal rotation               Shoulder external rotation               Middle trapezius               Lower trapezius               Elbow flexion  4/5 3+/5 4/5 N/A 4/5 4/5 3+/5 3+/5 3+/5 3+/5  Elbow extension   4/5 3+/5 4/4 N/A 4/5 4/5 3+/5 3+/5 4-/5 4-/5  Wrist flexion               Wrist extension   4/5 2-/5 3-/5 3-/5 3/5 3+/5 3-/5 3+/5 3+/5 3+/5  Wrist ulnar deviation               Wrist radial deviation               Wrist pronation               Wrist supination               (Blank rows = not tested)   HAND FUNCTION: Grip strength: Right: 26#, Left: 10# Pinch strength: Right 8#, Left: 3#, 3 Pt.  Pinch strength: Right: 9#, L: 2#  01/22/2023 Grip strength: Right: 26#, Left: 12# Pinch strength:  Pinch meter used at the initial eval has been sent out for recalibration   03/03/2023 Grip strength: Right: 26#, Left: 13# Pinch strength:  Pinch meter used at the initial eval has been sent out for recalibration  04/14/2023: Grip strength: Right: 26#, Left: 13# Pinch strength:   Right 8#, Left: 4#, 3 Pt. Pinch strength: Right: 9#, L: 3#    05/28/2023: Grip strength: Right: 26#, Left: 13# Pinch strength:   Right 8#, Left: 2#, 3 Pt. Pinch strength: Right: 9#, L: 4#  07/23/2023  Grip strength: Right: 5#, Left: 2# Pinch strength:   Right 6#, Left: 2#, 3 Pt. Pinch strength: Right: 5#, L: 2#  08/20/2023:    Grip strength: Right: 8#, Left: 4# Pinch strength:   Right 6#, Left: 2#, 3 Pt. Pinch strength: Right: 5#, L: 2#    09/17/23:  Grip strength: Right: 5#, Left: 4# Pinch strength:   Right 6#, Left: 2#, 3 Pt. Pinch strength: Right: 5#, L: 2#  10/28/2024  Grip strength: Right: 13#, Left: 6# Pinch strength:   Right 6#, Left: 2#, 3 Pt. Pinch strength: Right: 5#, L: 2#     COORDINATION: Right: 22 sec., Left: <5 min. To place 7 pegs with increased compensation proximally in the trunk, and through reflexive associated reactions.  01/22/23 Right: 22 sec., Left: 3 min. & 4 sec.  03/03/23 Right: 22 sec., Left: 1 min. & 39 sec.  04/14/23   TBD  05/28/23 Right: 22 sec., Left:  5 min.  07/23/2023  Right: 33 sec., Left:  Pt. Is unable to grasp, and place pegs into the pegboard. Pt. Was able to remove 9 vertical pegs in 34 sec.  08/20/2023:  Right: 27 sec., Left:  5 pegs placed in 5 min.; Pt. Was able to remove 9 vertical pegs in 28 sec.  09/17/23:  Right: 26 sec., Left:  3 min, & 49 sec.    10/28/2024:  Right: 26 sec., Left:  2 min, & 10 sec.        SENSATION: Light touch: WFL, proprioceptive awareness: Intact   EDEMA: N/A   MUSCLE TONE: LUE: Hypotonic    COGNITION: Overall cognitive status: WFL for tasks assessed. Pt. Is impulsive at times.   VISION: Subjective report: Pt. report having shingles affecting left eye  s/p 1 year. Has nerve pain Baseline vision: Wears glasses for reading only Visual history:  updated see clinical impression   VISION ASSESSMENT:    WFL for tasks performed   PERCEPTION: Intact   PRAXIS: Impaired: Motor planning   OBSERVATIONS:  Pt. more alert, and engaging since prior to the most recent hospitalization.    TODAY'S TREATMENT:     There. Ex.:   Pt. Worked on pinch strengthening in the left hand for lateral, and 3pt. pinch using yellow and red resistive clips. Attempted green however unable to complete this session - reports fatigue. Pt. worked on placing the clips at various vertical and horizontal angles. Tactile and verbal cues were required for eliciting the desired movement. Tolerated red resistive sponge for pinch strengthening tip to tip pinch and isolated digit flexion.    Self Care: Utilized red built up handle on fork to maintain grip in L hand. Used knife and for to cut up green theraputty to simulate cutting meat. Improved ability to maintain grasp on fork, use of R hand to assist with placing fork in alignment with putty roll.           PATIENT EDUCATION: Education details: LUE therapeutic exercise Person educated: Patient and Spouse Education method: Medical illustrator Education comprehension: verbalized understanding, returned demonstration, and needs further education   HOME EXERCISE PROGRAM:   Reviewed activities at home to promote isolated 2nd digit extension.    GOALS: Goals reviewed with patient? Yes   SHORT TERM GOALS: Target date: 12/10/2023   1. Patient will be independent with home exercise program for the left upper extremity Baseline: 10/29/2023: Independent 09/17/23: Independent 08/20/23: Pt. continues to require assist from he husband for HEPs 07/23/2023: Pt.  continues to require assist. 05/28/2023: Pt. Requires assist 04/09/2023: Pt. Continues to consistently attempt to engage her hand at hand 02/01/2023: Pt. Consistently attempts to perform HEPs independently. No current home exercise program Goal status: Ongoing   LONG TERM GOALS: Target date:  01/21/2024  1.  Patient will improve left shoulder strength by 2 mm grades to be able to sustain UE's in elevation long enough to wash her hair.  Baseline: 10/29/2023: Left shoulder flexion 3-/5, abduction: 3-/5 10/27/2023:  Pt. Is improving with reaching up to reach her hair for hair care. Pt. Continues to have difficulty sustaining the UE in elevation long enough to perform hair care. 09/17/2023: Left shoulder 2+/5, abduction 2+/5 08/20/2023: Left shoulder flexion 2+/5, abduction 2+/5  07/23/2023: Left shoulder flexion: 2/5, abduction: 2/5. 05/28/2023: Left shoulder flexion: 3-/5, abduction: 3-/5. Pt. has an old left shoulder injury limiting progression with left shoulder strength 05/26/2023: Pt. continues to present with limitiations in sustaining LUE elevation long enough to perform hair care thoroughly 04/09/2023: Pt. is limited with sustaining LUE elevation long enough to perform hair care thoroughly. 03/03/2023: Left shoulder flexion: 3-/5, abduction: 3-/5 01/22/2023: Left shoulder flexion: 3-/5, abduction: 3-/5 Eval: Left shoulder flexion: 3/5, abduction: 3-/5 Goal status: Ongoing   2.  Patient will improve left shoulder active abduction to be able to comb her hair Baseline: 10/29/2023: Left shoulder abduction: 75(98) 10/27/2023: Pt. Is improving with reaching the left side of her hair for haircare. Pt. Has difficulty reaching with the left side to the back, and right side of her head.09/17/2023: 73(94) Pt. is able to reach her left side, and back of hair. 9/11/09/2023: 72(85) Pt. is able to reach her left side, and back of hair. 07/23/2023: Left shoulder flexion: 60(70) abduction: 65(78) 05/28/2023: left shoulder flexion:  108 abduction: 85 Pt. Has an old left shoulder injury limiting progression with left shoulder ROM 05/26/2023:  5/10 left shoulder pain with ROM limits using it functionally during hair care. 04/09/2023: Pt. Presents with difficulty abducting her left shoulder enough to thoroughly complete  haircare. 03/03/2023: Shoulder abduction: 85 01/22/2023: Shoulder abduction: 85 Eval: Left shoulder abduction is 80(108) Goal status: Ongoing   3.  Patient will independently button her shirt with modified independence. Baseline:10/29/2023: Pt. Has difficulty manipulating buttons, will benefit from a buttonhook.10/27/23: Pt. Requires increased time to complete buttons.09/17/2023: Pt. Has a buttonhook, however does not know where it is. Pt. requires increased time to complete, and assist from her husband. 08/20/2023: Pt. requires increased time to complete, and assist from her husband. 07/23/2023 Pt. presents with difficulty buttoning her shirt. 05/28/2023: pt. Continues to work towards progressing with buttoning 05/26/2023:  Pt. Continues to progress towards buttoning. 04/09/2023: Pt. Continues to progress towards buttoning. 03/03/2023: Pt. Continues to have difficulty with buttoning. 01/22/2023: Pt. continues to have difficulty. Eval: Patient has difficulty.  Goal status: Ongoing   4.  Patient well improve left grip strength in preparatio for securely hold a glass/beverage Baseline: 10/29/2023: Grip strength: Right: 13#  Left: 6#10/27/2023: Pt. Has difficulty securely holding heavier items with larger diameter. 09/17/2023: Grip strength: Right: 5#, Left: 4# Pt. Has difficulty holding a glass. Right: 8#, Left: 4# Pt. Is able to more securely hold mirrors, and flowers with the left hand, however has difficulty holding a glass beverage, or bottle. 08/20/2023: Right: 8#, Left: 4# Pt. Is able to more securely hold mirrors, and flowers with the left hand, however has difficulty holding a glass beverage, or bottle. 07/23/2023: Grip strength:  Right: 5#, Left: 2# 05/28/2023: Right: 26#, Left: 13#  pt. Presents with difficulty securely holding flowers in her left hand. 05/26/2023: TBD 04/09/2023: Pt. Is able to hold, and hike pants with the left hand, continues to have difficulty with securely holding flowers.   03/03/2023: left grip strength: 13# 01/22/2023: Left: 12# Eval: Pt. Is unable to securely hold flowers. Goal status: Ongoing   5.  Pt. will independently recall adaptive  strategies for performing ADL/ADL kitchen tasks.  Baseline:10/29/2023: Continue 11/19/204: Continue 08/20/2023: Revised to include IADL kitchen tasks.  07/23/2023: Continue 05/28/2023: Pt. Needs continued education about adaptive strategies.05/26/2023: Continue 04/09/2023: Continue3/26/2024: Continue 01/22/2023: Pt. continues to benefit from education about adaptive strategies during ADLs, and IADLs. Eval: Pt. to be provided with adaptive strategies. Goal status: Revised   6.  Pt. will improve FOTO score by 2 points to reflect patient perceived performance improvement assessment specific ADLs  and IADLs Baseline: 16/09/9603: 66 10/27/2023: TBD 09/17/2023: FOTO score: 61 07/23/2023: FOTO score: 57 05/28/2023: FOTO score 59 05/26/2023: TBD 5/02/204:  TBD 03/03/23: FOTO 62 Eval: 57 Goal status: Ongoing  7.  Pt. will improve left hand coordination skills in order to be able to  manipulate small objects.     Baseline: 10/29/2023 Left: 2 min. & 10 sec. 10/27/2023: Pt. Is improving with manipulating small objects.09/17/2023: Right: 26 sec., Left:  3 min, & 49 sec. pegs placed in 5 min.; Pt. Was able to remove 9 vertical pegs in 24 sec. 08/20/2023: Right: 27 sec., Left:  5 pegs placed in 5 min.; Pt. Was able to remove 9 vertical pegs in 28 sec. Pt. Is able to hold, and sort utensils in a drawer. Pt. Has difficulty manipulating small objects. 07/23/2023: Right: 33 sec., Left:  Pt. Is unable to grasp, and place pegs into the pegboard. Pt. Was able to remove 9 vertical pegs in 34 sec. 05/28/2023:  Left:  5 min. 05/26/2023 Pt. Continues to present with difficulty handling and sorting utensils. 04/14/2023: 56 04/09/2023: TBD 03/03/23: 1 min. & 39 01/22/2022: Left: 3 min. & 4 sec. Eval:  Pt. has difficulty sorting, and placing utensils with the left hand. Left FMC : >5 min. For 7 pegs on the 9 hole peg test.    Goal Status: Ongoing  8. Pt. will improve active left 2nd digit extension to be able able to isolate her 2nd digit in preparation for pressing/pushing buttons on appliances, phones, or remotes. Baseline: 10/29/2023: Pt. Is improving with 2nd digit isolation, to however continues to present with limited prolonged 2nd digit extension11/19/2024: Pt. Is improving with 2nd digit isolation, to however presents with limited prolonged 2nd digit extension. 09/17/2023: Pt. Presents with limited isolated 2nd digit extension with increased time. 08/20/2023: Pt. Is more consistently able to isolate 2nd digit extension with increased time. 07/23/2023: Pt. Continues to work on consistency with isolating left 2nd digit extension 05/28/2023: Pt. Continues to improve with left 2nd digit extension 05/26/2023: Improving with left 2nd digit extension. 04/09/2023: Pt. Is progressing with isolating left 2nd digit extension, however continues to present with limited increased flexor tone. 03/03/23: Pt. Continues to work on improving consistency with 2nd digit extension to press the remote. 01/22/2023: is able to perform full digit extension, although 2nd digit is slow to extend s 2/2 flexor tone. Pt.  Eval: Pt. is able to is unable to actively perform full digit extension Goal status: Ongoing  9: Pt. Will cut food with modified independence Baseline: 10/29/2023: : Pt. Continues to have difficulty cutting food on her plate.10/27/2023: Pt. Continues to have difficulty cutting food on her plate. 09/17/2023: Pt. Is unable to cut the food on her plate, as well as cut food for meal preparation. 08/20/2023: Pt. Is unable to cut the food on  her plate, as well as cut food for meal preparation.    Goal Status: Ongoing   ASSESSMENT:   CLINICAL IMPRESSION:  Improved ability to cut simulated meat with fork in L hand using built up handle (provided for home use). Increased difficulty with L pinch grip, tolerates yellow and red clips only. Completed isolated red resistive sponge pinches with pt fatiguing quickly. Reviewed HEP. Pt. continues to work on improving LUE ROM, strength, FMC, and hand function skills in order to work towards improving, and maximizing independence with ADLs, and IADL tasks.   PERFORMANCE DEFICITS in functional skills including ADLs, IADLs, coordination, proprioception, ROM, strength, FMC, and GMC, cognitive skills including memory, and psychosocial skills including coping strategies, environmental adaptation, interpersonal interactions, and routines and behaviors.    IMPAIRMENTS are limiting patient from ADLs, IADLs, education, leisure, and social participation.    COMORBIDITIES may have co-morbidities  that affects occupational performance. Patient will benefit from skilled OT to address above impairments and improve overall function.   MODIFICATION OR ASSISTANCE TO COMPLETE EVALUATION: Min-Moderate modification of tasks or assist with assess necessary to complete an evaluation.   OT OCCUPATIONAL PROFILE AND HISTORY: Detailed assessment: Review of records and additional review of physical, cognitive, psychosocial history related to current functional performance.   CLINICAL DECISION MAKING: Moderate - several treatment options, min-mod task modification necessary   REHAB POTENTIAL: Good   EVALUATION COMPLEXITY: Moderate      PLAN: OT FREQUENCY: 2x/week   OT DURATION: 12 weeks   PLANNED INTERVENTIONS: self care/ADL training, therapeutic exercise, therapeutic activity, neuromuscular re-education, manual therapy, passive range of motion, functional mobility training, electrical stimulation, and paraffin    RECOMMENDED OTHER SERVICES: PT   CONSULTED AND AGREED WITH PLAN OF CARE: Patient and family member/caregiver   PLAN FOR NEXT SESSION: see above   Kathie Dike, M.S.  OTR/L  11/10/23, 3:36 PM  ascom [phone]

## 2023-11-11 DIAGNOSIS — E039 Hypothyroidism, unspecified: Secondary | ICD-10-CM | POA: Diagnosis not present

## 2023-11-11 DIAGNOSIS — Z79899 Other long term (current) drug therapy: Secondary | ICD-10-CM | POA: Diagnosis not present

## 2023-11-11 DIAGNOSIS — I10 Essential (primary) hypertension: Secondary | ICD-10-CM | POA: Diagnosis not present

## 2023-11-11 DIAGNOSIS — E538 Deficiency of other specified B group vitamins: Secondary | ICD-10-CM | POA: Diagnosis not present

## 2023-11-11 DIAGNOSIS — F418 Other specified anxiety disorders: Secondary | ICD-10-CM | POA: Diagnosis not present

## 2023-11-11 DIAGNOSIS — R7303 Prediabetes: Secondary | ICD-10-CM | POA: Diagnosis not present

## 2023-11-11 DIAGNOSIS — E781 Pure hyperglyceridemia: Secondary | ICD-10-CM | POA: Diagnosis not present

## 2023-11-12 ENCOUNTER — Ambulatory Visit: Payer: Medicare HMO | Admitting: Physical Therapy

## 2023-11-12 ENCOUNTER — Ambulatory Visit: Payer: Medicare HMO | Admitting: Occupational Therapy

## 2023-11-12 DIAGNOSIS — M6281 Muscle weakness (generalized): Secondary | ICD-10-CM | POA: Diagnosis not present

## 2023-11-12 DIAGNOSIS — R2681 Unsteadiness on feet: Secondary | ICD-10-CM

## 2023-11-12 DIAGNOSIS — R531 Weakness: Secondary | ICD-10-CM

## 2023-11-12 DIAGNOSIS — R278 Other lack of coordination: Secondary | ICD-10-CM

## 2023-11-12 DIAGNOSIS — R262 Difficulty in walking, not elsewhere classified: Secondary | ICD-10-CM

## 2023-11-12 NOTE — Therapy (Addendum)
Occupational Therapy Neuro Treatment Note    Patient Name: [name] MRN: [id number] DOB:1941-11-14, 82 y.o., Castillo Today's Date: 11/12/2023  PCP: Dr. Judithann Sheen REFERRING PROVIDER:  Dr. Judithann Sheen  END OF SESSION:  PLAN:   OT End of Session - 11/12/23 1736     Visit Number [id number]    Number of Visits 96    Date for OT Re-Evaluation 01/21/24    OT Start Time 1530    OT Stop Time 1615    OT Time Calculation (min) 45 min    Equipment Utilized During Treatment Wheelchair    Activity Tolerance Patient tolerated treatment well    Behavior During Therapy WFL for tasks assessed/performed                   Past Medical History:  Diagnosis Date   Anemia    Arthritis    Asthma    uses inhaler just prior to surgery to avoid attack   Back pain    from previous injury   Complication of anesthesia    has woken  up during 2 different surgery   Depression    no current issue/treatment; situation   Gallstones    GERD (gastroesophageal reflux disease)    Hiatal hernia    patient does NOT have nerve/muscle disease   History of kidney stones    HLD (hyperlipidemia)    HTN (hypertension)    Hypothyroidism    Kidney stones    Knee pain    Nausea and vomiting 10/15/2022   Non-diabetic pancreatic hormone dysfunction years   pt. 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   Pancreatitis    Pneumonia    Seizures (HCC)    caused by dye injected during a procedure   Shortness of breath    with exertion   Sinus problem    frequent infections/congestion   Stroke (HCC) 2021   reports having CVA in 2021 and having mini strokes before that   Thyroid disease    Past Surgical History:  Procedure Laterality Date   ABDOMINAL HYSTERECTOMY     APPENDECTOMY     CARPAL TUNNEL RELEASE  10+ years ago   bilateral   EYE SURGERY  3 yrs ago   bilateral cataracts   FOOT OSTEOTOMY  6 weeks ago   Left foot: great, 2nd & 3rd   FOOT OSTEOTOMY  5 years ago   Right great toe   HAND SURGERY  Bilateral 2011-most recent   multiple hand surgeries, 2 on left, 3 on right   KNEE ARTHROPLASTY Right 04/28/2022   Procedure: COMPUTER ASSISTED TOTAL KNEE ARTHROPLASTY;  Surgeon: Donato Heinz, MD;  Location: [facility];  Service: Orthopedics;  Laterality: Right;   LOOP RECORDER INSERTION N/A 05/16/2020   Procedure: LOOP RECORDER INSERTION;  Surgeon: Marcina Millard, MD;  Location: [facility];  Service: Cardiovascular;  Laterality: N/A;   NASAL SINUS SURGERY  most recent 7-8 yrs ago   7 sinus surgeries    TRIGGER FINGER RELEASE  11/19/2011   Procedure: RELEASE TRIGGER FINGER/A-1 PULLEY;  Surgeon: Nicki Reaper, MD;  Location: [facility];  Service: Orthopedics;  Laterality: Right;  release a-1 pulley right index finger and cyst removal   WRIST GANGLION EXCISION  1980's   right   Patient Active Problem List   Diagnosis Date Noted   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 11/27/2022   Hypomagnesemia 11/27/2022   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   HLD (hyperlipidemia) 11/23/2022  Asthma 11/23/2022   Depression with anxiety 11/23/2022   Chronic diastolic CHF (congestive heart failure) (HCC) 11/23/2022   Normocytic anemia 11/23/2022   Aspiration pneumonia (HCC) 11/23/2022   AKI (acute kidney injury) (HCC) 11/23/2022   Abdominal pain 11/23/2022   Mesenteric mass 11/23/2022   Coffee ground emesis 11/23/2022   Nausea and vomiting 10/15/2022   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   Fatigue 09/17/2022   Left hemiparesis (HCC) 09/17/2022   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HCC) 08/22/2022   GERD (gastroesophageal reflux disease) 08/21/2022   Agitation 08/20/2022   Acute left-sided weakness 08/20/2022   Expressive aphasia    Stroke (HCC) 08/19/2022   Leukocytosis 08/19/2022   History of urticaria 04/28/2022   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   Primary osteoarthritis of left knee 02/24/2022   Primary osteoarthritis of right knee  02/24/2022   Lumbar spondylolysis 04/12/2020   History of CVA (cerebrovascular accident) 03/26/2020   Low back pain radiating to right lower extremity 03/21/2020   B12 deficiency 03/06/2020   Positive anti-CCP test 12/21/2019   Arthralgia 12/13/2019   Dermatitis 12/13/2019   Rheumatoid factor positive 12/13/2019   Essential hypertension 12/11/2018   Palpitations 12/11/2018   Acquired hypothyroidism 11/10/2018   Arthritis of knee 09/17/2016   Anxiety 11/22/2014   Asthma without status asthmaticus 11/22/2014   Benign neoplasm of colon, unspecified 11/22/2014   Environmental allergies 11/22/2014   Hypertriglyceridemia 11/22/2014   Hypokalemia 11/22/2014   Personal history of disease of skin and subcutaneous tissue 11/22/2014   REFERRING DIAG: CVA   THERAPY DIAG:  Muscle weakness (generalized)   Castillo lack of coordination   Rationale for Evaluation and Treatment Rehabilitation   SUBJECTIVE:   SUBJECTIVE STATEMENT:  Pt. reports doing well today.  Pt accompanied by: [name]   PERTINENT HISTORY: Patient is an [name] who was admitted to [facility] on 11/23/2022 with a cerebellar CVA. 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 Pt. Was admitted to Inpatient Rehab from 12/21-1/02/2023. 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  Patient underwent inpatient rehabilitation for 2 weeks.  Patient was assessed and was scheduled for a knee replacement on 08/29/2022 however had to cancel it due to having had a CVA.  Patient had a recent fall 2 days after discharging from inpatient rehab. Past Medical History includes: Knee replacement, essential HTN, hypokalemia, leukocytosis, seizures, positive anti-- CCP test, anxiety disorder, mini strokes.  Patient had shingles with left eye nerve pain s/p 1 year ago.    PRECAUTIONS: Fall   WEIGHT BEARING RESTRICTIONS No   PAIN:  Are you having pain?   No pain.   FALLS: Has patient fallen in last 6 months? Yes. Number of falls 1   LIVING ENVIRONMENT: Lives with: Lives with Spouse Lives in: House/apartment Stairs: 2 storey home, resides on the first floor.  External: 2 stairs front no rails, and 6 in back with rails Has following equipment at home: Single point cane, Walker - 2 wheeled, Environmental consultant - 4 wheeled, Shower bench, and bed side commode   PLOF: Independent   PATIENT GOALS  To Regain the use of her left arm   OBJECTIVE:    HAND DOMINANCE: Right   ADLs: Overall ADLs: Husband assists pt. as needed Transfers/ambulation related to ADLs:Pt. Uses a 3 wheeled walker with Husband assist. Eating: Pt. Is independent with the right hand. Pt. has difficulty cutting food. Grooming: Pt. is using her right hand, however has difficulty sustaining her LUE in elevation  to assist with haircare. UB Dressing: Pt. Is independent donning a pullover shirt, and button down shirt. Has difficulty with buttoning, LB Dressing:  Independent donning pants, and socks. Difficulty tying shoes. Toileting: Independent Bathing: Pt. Is able to engage her right hand. Tub Shower transfers: Supervision Equipment: See above for equipment    IADLs: Shopping:  Has not had the opportunity for grocery shopping yet Light housekeeping: Husband is assisting with light house keeping Meal Prep:  Dependent Community mobility: Relies of family/friends Medication management: Husband assisting with weekly pillbox set-up, and administering medication. Financial management: TBD Handwriting: 75% legible   MOBILITY STATUS: Hx of falls   POSTURE COMMENTS:  No Stephanie postural limitations Sitting balance: supported sitting balance WFL   ACTIVITY TOLERANCE: Activity tolerance:  Fatigues in greater than 30 min.    FUNCTIONAL OUTCOME MEASURES: FOTO: 57   UPPER EXTREMITY ROM      Active ROM Right Eval: WFL Left eval Left  01/22/23 Left  03/03/23 Left  04/14/23 Left   05/28/2023 Left 07/23/2023 Left 08/20/2023 Left 09/17/23 Left 10/29/23  Shoulder flexion   132 100 108 108 108 60(70) 70(78) 70(102) 82(110)  Shoulder abduction   80 85 85 85 85 65(78) 72(85) 73(94) 75(98)  Shoulder adduction              Shoulder extension              Shoulder internal rotation              Shoulder external rotation              Elbow flexion   140 140 WFL [facility] WFL  Elbow extension   WNL WNL [facility] WFL WFL  Wrist flexion   65 Stephanie         Wrist extension   -10 20 24  32 32 -10 20 20 26   Wrist ulnar deviation     12 10 14 14 10 18 18 20   Wrist radial deviation     8 14 14 16 12 14 14 14   Wrist pronation              Wrist supination              (Blank rows = not tested)   Left digit flexion to [facility]: 2nd: 0cm, 3rd: 0cm, 4th: 0cm, 5th: 0cm   Limited Left full 2nd digit extension     UPPER EXTREMITY MMT:      MMT Right Eval: 4+/5 overall Right 07/23/2023 Left Eval Left 01/22/23 Left 03/03/23 Left  04/14/2023 Left 05/28/2023 Left 07/23/2023 Left 08/20/2023 Left 09/17/23 Left  10/29/23  Shoulder flexion   3+/5 3/5 3-/5 3-/5 3-/5 3-/5 2/5 2+/5 2+/5 3-/5  Shoulder abduction   3+/5 3-/5 3-/5 3-/5 3-/5 3-/5 2/5 2+/5 2+/5 3-/5  Shoulder adduction               Shoulder extension               Shoulder internal rotation               Shoulder external rotation               Middle trapezius               Lower trapezius               Elbow flexion   4/5 3+/5 4/5 N/A 4/5 4/5 3+/5 3+/5  3+/5 3+/5  Elbow extension   4/5 3+/5 4/4 N/A 4/5 4/5 3+/5 3+/5 4-/5 4-/5  Wrist flexion               Wrist extension   4/5 2-/5 3-/5 3-/5 3/5 3+/5 3-/5 3+/5 3+/5 3+/5  Wrist ulnar deviation               Wrist radial deviation               Wrist pronation               Wrist supination               (Blank rows = not tested)   HAND FUNCTION: Grip strength: Right: 26#, Left: 10# Pinch strength: Right 8#, Left: 3#, 3 Pt. Pinch strength: Right: 9#, L:  2#  01/22/2023 Grip strength: Right: 26#, Left: 12# Pinch strength:  Pinch meter used at the initial eval has been sent out for recalibration   03/03/2023 Grip strength: Right: 26#, Left: 13# Pinch strength:  Pinch meter used at the initial eval has been sent out for recalibration  04/14/2023: Grip strength: Right: 26#, Left: 13# Pinch strength:   Right 8#, Left: 4#, 3 Pt. Pinch strength: Right: 9#, L: 3#    05/28/2023: Grip strength: Right: 26#, Left: 13# Pinch strength:   Right 8#, Left: 2#, 3 Pt. Pinch strength: Right: 9#, L: 4#  07/23/2023  Grip strength: Right: 5#, Left: 2# Pinch strength:   Right 6#, Left: 2#, 3 Pt. Pinch strength: Right: 5#, L: 2#  08/20/2023:    Grip strength: Right: 8#, Left: 4# Pinch strength:   Right 6#, Left: 2#, 3 Pt. Pinch strength: Right: 5#, L: 2#    09/17/23:  Grip strength: Right: 5#, Left: 4# Pinch strength:   Right 6#, Left: 2#, 3 Pt. Pinch strength: Right: 5#, L: 2#  10/28/2024  Grip strength: Right: 13#, Left: 6# Pinch strength:   Right 6#, Left: 2#, 3 Pt. Pinch strength: Right: 5#, L: 2#     COORDINATION: Right: 22 sec., Left: <5 min. To place 7 pegs with increased compensation proximally in the trunk, and through reflexive associated reactions.  01/22/23 Right: 22 sec., Left: 3 min. & 4 sec.  03/03/23 Right: 22 sec., Left: 1 min. & 39 sec.  04/14/23   TBD  05/28/23 Right: 22 sec., Left:  5 min.  07/23/2023  Right: 33 sec., Left:  Pt. Is unable to grasp, and place pegs into the pegboard. Pt. Was able to remove 9 vertical pegs in 34 sec.  08/20/2023:  Right: 27 sec., Left:  5 pegs placed in 5 min.; Pt. Was able to remove 9 vertical pegs in 28 sec.  09/17/23:  Right: 26 sec., Left:  3 min, & 49 sec.    10/28/2024:  Right: 26 sec., Left:  2 min, & 10 sec.        SENSATION: Light touch: WFL, proprioceptive awareness: Intact   EDEMA: N/A   MUSCLE TONE: LUE: Hypotonic   COGNITION: Overall cognitive status: WFL for  tasks assessed. Pt. Is impulsive at times.   VISION: Subjective report: Pt. report having shingles affecting left eye  s/p 1 year. Has nerve pain Baseline vision: Wears glasses for reading only Visual history:  updated see clinical impression   VISION ASSESSMENT:    WFL for tasks performed   PERCEPTION: Intact   PRAXIS: Impaired: Motor planning   OBSERVATIONS:  Pt. more alert, and engaging since prior to  the most recent hospitalization.    TODAY'S TREATMENT:     Contrast Bath:  Pt. tolerated contrasting heat pack for 3 min. followed by cold pack for 1 min. for 3 trials ending with 3 min. of heat for a total of 15 min. to the Left  shoulder 2/2 pain, edema, and stiffness. Contrast bath was performed in preparation for neuromuscular re-education.    Pt. worked on using her hand hand [facility] skills using the [employer] Dexterity Task using manual tweezers without spring loaded tension. Pt. required verbal cues, and cues for visual demonstration for wrist position, and hand pattern when placing them into the pegboard. Pt. worked on dual tasking by holding a conversation during the [facility] task. Pt. Worked on removing the pegs using thumb opposition to the tip of the 2nd though 5th digits.               PATIENT EDUCATION: Education details: LUE therapeutic exercise Person educated: Patient and Spouse Education method: Medical illustrator Education comprehension: verbalized understanding, returned demonstration, and needs further education   HOME EXERCISE PROGRAM:   Reviewed activities at home to promote isolated 2nd digit extension.    GOALS: Goals reviewed with patient? Yes   SHORT TERM GOALS: Target date: 12/10/2023   1. Patient will be independent with home exercise program for the left upper extremity Baseline: 10/29/2023: Independent 09/17/23: Independent 08/20/23: Pt. continues to require assist from he husband for HEPs 07/23/2023: Pt. continues to require assist.  05/28/2023: Pt. Requires assist 04/09/2023: Pt. Continues to consistently attempt to engage her hand at hand 02/01/2023: Pt. Consistently attempts to perform HEPs independently. No current home exercise program Goal status: Ongoing   LONG TERM GOALS: Target date:  01/21/2024  1.  Patient will improve left shoulder strength by 2 mm grades to be able to sustain UE's in elevation long enough to wash her hair.  Baseline: 10/29/2023: Left shoulder flexion 3-/5, abduction: 3-/5 10/27/2023:  Pt. Is improving with reaching up to reach her hair for hair care. Pt. Continues to have difficulty sustaining the UE in elevation long enough to perform hair care. 09/17/2023: Left shoulder 2+/5, abduction 2+/5 08/20/2023: Left shoulder flexion 2+/5, abduction 2+/5  07/23/2023: Left shoulder flexion: 2/5, abduction: 2/5. 05/28/2023: Left shoulder flexion: 3-/5, abduction: 3-/5. Pt. has an old left shoulder injury limiting progression with left shoulder strength 05/26/2023: Pt. continues to present with limitiations in sustaining LUE elevation long enough to perform hair care thoroughly 04/09/2023: Pt. is limited with sustaining LUE elevation long enough to perform hair care thoroughly. 03/03/2023: Left shoulder flexion: 3-/5, abduction: 3-/5 01/22/2023: Left shoulder flexion: 3-/5, abduction: 3-/5 Eval: Left shoulder flexion: 3/5, abduction: 3-/5 Goal status: Ongoing   2.  Patient will improve left shoulder active abduction to be able to comb her hair Baseline: 10/29/2023: Left shoulder abduction: 75(98) 10/27/2023: Pt. Is improving with reaching the left side of her hair for haircare. Pt. Has difficulty reaching with the left side to the back, and right side of her head.09/17/2023: 73(94) Pt. is able to reach her left side, and back of hair. 9/11/09/2023: 72(85) Pt. is able to reach her left side, and back of hair. 07/23/2023: Left shoulder flexion: 60(70) abduction: 65(78) 05/28/2023: left shoulder flexion: 108 abduction: 85 Pt. Has an  old left shoulder injury limiting progression with left shoulder ROM 05/26/2023:  5/10 left shoulder pain with ROM limits using it functionally during hair care. 04/09/2023: Pt. Presents with difficulty abducting her left shoulder enough to thoroughly complete  haircare. 03/03/2023: Shoulder abduction: 85 01/22/2023: Shoulder abduction: 85 Eval: Left shoulder abduction is 80(108) Goal status: Ongoing   3.  Patient will independently button her shirt with modified independence. Baseline:10/29/2023: Pt. Has difficulty manipulating buttons, will benefit from a buttonhook.10/27/23: Pt. Requires increased time to complete buttons.09/17/2023: Pt. Has a buttonhook, however does not know where it is. Pt. requires increased time to complete, and assist from her husband. 08/20/2023: Pt. requires increased time to complete, and assist from her husband. 07/23/2023 Pt. presents with difficulty buttoning her shirt. 05/28/2023: pt. Continues to work towards progressing with buttoning 05/26/2023:  Pt. Continues to progress towards buttoning. 04/09/2023: Pt. Continues to progress towards buttoning. 03/03/2023: Pt. Continues to have difficulty with buttoning. 01/22/2023: Pt. continues to have difficulty. Eval: Patient has difficulty.  Goal status: Ongoing   4.  Patient well improve left grip strength in preparatio for securely hold a glass/beverage Baseline: 10/29/2023: Grip strength: Right: 13#  Left: 6#10/27/2023: Pt. Has difficulty securely holding heavier items with larger diameter. 09/17/2023: Grip strength: Right: 5#, Left: 4# Pt. Has difficulty holding a glass. Right: 8#, Left: 4# Pt. Is able to more securely hold mirrors, and flowers with the left hand, however has difficulty holding a glass beverage, or bottle. 08/20/2023: Right: 8#, Left: 4# Pt. Is able to more securely hold mirrors, and flowers with the left hand, however has difficulty holding a glass beverage, or bottle. 07/23/2023: Grip strength: Right: 5#, Left: 2#  05/28/2023: Right: 26#, Left: 13#  pt. Presents with difficulty securely holding flowers in her left hand. 05/26/2023: TBD 04/09/2023: Pt. Is able to hold, and hike pants with the left hand, continues to have difficulty with securely holding flowers.   03/03/2023: left grip strength: 13# 01/22/2023: Left: 12# Eval: Pt. Is unable to securely hold flowers. Goal status: Ongoing   5.  Pt. will independently recall adaptive  strategies for performing ADL/ADL kitchen tasks.  Baseline:10/29/2023: Continue 11/19/204: Continue 08/20/2023: Revised to include IADL kitchen tasks.  07/23/2023: Continue 05/28/2023: Pt. Needs continued education about adaptive strategies.05/26/2023: Continue 04/09/2023: Continue3/26/2024: Continue 01/22/2023: Pt. continues to benefit from education about adaptive strategies during ADLs, and IADLs. Eval: Pt. to be provided with adaptive strategies. Goal status: Revised   6.  Pt. will improve FOTO score by 2 points to reflect patient perceived performance improvement assessment specific ADLs  and IADLs Baseline: 40/98/1191: 66 10/27/2023: TBD 09/17/2023: FOTO score: 61 07/23/2023: FOTO score: 57 05/28/2023: FOTO score 59 05/26/2023: TBD 5/02/204:  TBD 03/03/23: FOTO 62 Eval: 57 Goal status: Ongoing  7.  Pt. will improve left hand coordination skills in order to be able to  manipulate small objects.     Baseline: 10/29/2023 Left: 2 min. & 10 sec. 10/27/2023: Pt. Is improving with manipulating small objects.09/17/2023: Right: 26 sec., Left:  3 min, & 49 sec. pegs placed in 5 min.; Pt. Was able to remove 9 vertical pegs in 24 sec. 08/20/2023: Right: 27 sec., Left:  5 pegs placed in 5 min.; Pt. Was able to remove 9 vertical pegs in 28 sec. Pt. Is able to hold, and sort utensils in a drawer. Pt. Has difficulty manipulating small objects. 07/23/2023: Right: 33 sec., Left:  Pt. Is unable to grasp, and place pegs into the pegboard. Pt. Was able to remove 9 vertical pegs in 34 sec. 05/28/2023: Left:  5 min.  05/26/2023 Pt. Continues to present with difficulty handling and sorting utensils. 04/14/2023: 56 04/09/2023: TBD 03/03/23: 1 min. & 39 01/22/2022: Left: 3 min. & 4 sec. Eval:  Pt. has difficulty sorting, and placing utensils with the left hand. Left FMC : >5 min. For 7 pegs on the 9 hole peg test.    Goal Status: Ongoing  8. Pt. will improve active left 2nd digit extension to be able able to isolate her 2nd digit in preparation for pressing/pushing buttons on appliances, phones, or remotes. Baseline: 10/29/2023: Pt. Is improving with 2nd digit isolation, to however continues to present with limited prolonged 2nd digit extension11/19/2024: Pt. Is improving with 2nd digit isolation, to however presents with limited prolonged 2nd digit extension. 09/17/2023: Pt. Presents with limited isolated 2nd digit extension with increased time. 08/20/2023: Pt. Is more consistently able to isolate 2nd digit extension with increased time. 07/23/2023: Pt. Continues to work on consistency with isolating left 2nd digit extension 05/28/2023: Pt. Continues to improve with left 2nd digit extension 05/26/2023: Improving with left 2nd digit extension. 04/09/2023: Pt. Is progressing with isolating left 2nd digit extension, however continues to present with limited increased flexor tone. 03/03/23: Pt. Continues to work on improving consistency with 2nd digit extension to press the remote. 01/22/2023: is able to perform full digit extension, although 2nd digit is slow to extend s 2/2 flexor tone. Pt.  Eval: Pt. is able to is unable to actively perform full digit extension Goal status: Ongoing  9: Pt. Will cut food with modified independence Baseline: 10/29/2023: : Pt. Continues to have difficulty cutting food on her plate.10/27/2023: Pt. Continues to have difficulty cutting food on her plate. 09/17/2023: Pt. Is unable to cut the food on her plate, as well as cut food for meal preparation. 08/20/2023: Pt. Is unable to cut the food on her plate, as  well as cut food for meal preparation.    Goal Status: Ongoing   ASSESSMENT:   CLINICAL IMPRESSION:  Pt. reports having left shoulder discomfort, Pt. reports bumping her left shoulder at home. Pt. tolerated contrasting heat/cold to the left shoulder today. Pt. Conittinues to present with limited left hand [facility] skills, requiring increased time to manipulate the smaller objects, and the manual tweezers. Pt. continues to work on improving LUE ROM, strength, FMC, and hand function skills in order to work towards improving, and maximizing independence with ADLs, and IADL tasks.   PERFORMANCE DEFICITS in functional skills including ADLs, IADLs, coordination, proprioception, ROM, strength, FMC, and GMC, cognitive skills including memory, and psychosocial skills including coping strategies, environmental adaptation, interpersonal interactions, and routines and behaviors.    IMPAIRMENTS are limiting patient from ADLs, IADLs, education, leisure, and social participation.    COMORBIDITIES may have co-morbidities  that affects occupational performance. Patient will benefit from skilled OT to address above impairments and improve overall function.   MODIFICATION OR ASSISTANCE TO COMPLETE EVALUATION: Min-Moderate modification of tasks or assist with assess necessary to complete an evaluation.   OT OCCUPATIONAL PROFILE AND HISTORY: Detailed assessment: Review of records and additional review of physical, cognitive, psychosocial history related to current functional performance.   CLINICAL DECISION MAKING: Moderate - several treatment options, min-mod task modification necessary   REHAB POTENTIAL: Good   EVALUATION COMPLEXITY: Moderate      PLAN: OT FREQUENCY: 2x/week   OT DURATION: 12 weeks   PLANNED INTERVENTIONS: self care/ADL training, therapeutic exercise, therapeutic activity, neuromuscular re-education, manual therapy, passive range of motion, functional mobility training, electrical  stimulation, and paraffin   RECOMMENDED Castillo SERVICES: PT   CONSULTED AND AGREED WITH PLAN OF CARE: Patient and family member/caregiver   PLAN FOR NEXT SESSION: see above   Consuella Lose  Vastie Douty, MS, OTR/L  11/12/23, 5:38 PM

## 2023-11-12 NOTE — Therapy (Signed)
OUTPATIENT PHYSICAL THERAPY TREATMENT/     Patient Name: [name] MRN: [id number] DOB:July 27, 1941, 82 y.o., female Today's Date: 11/12/2023   PCP: [name], D MD REFERRING PROVIDER: Jacquelynn Cree PA   END OF SESSION:  PT End of Session - 11/12/23 1621     Visit Number 18    Number of Visits 31    Date for PT Re-Evaluation 12/24/23    Authorization Type [employer]:    Authorization Time Period approved for 24 additional tx from 09/28/23 to 12/24/23; total visit count 31    Authorization - Number of Visits 24    Progress Note Due on Visit 10    PT Start Time 1617    PT Stop Time 1700    PT Time Calculation (min) 43 min    Equipment Utilized During Treatment Gait belt    Activity Tolerance Patient tolerated treatment well    Behavior During Therapy WFL for tasks assessed/performed                 Past Medical History:  Diagnosis Date   Anemia    Arthritis    Asthma    uses inhaler just prior to surgery to avoid attack   Back pain    from previous injury   Complication of anesthesia    has woken  up during 2 different surgery   Depression    no current issue/treatment; situation   Gallstones    GERD (gastroesophageal reflux disease)    Hiatal hernia    patient does NOT have nerve/muscle disease   History of kidney stones    HLD (hyperlipidemia)    HTN (hypertension)    Hypothyroidism    Kidney stones    Knee pain    Nausea and vomiting 10/15/2022   Non-diabetic pancreatic hormone dysfunction years   pt. 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   Pancreatitis    Pneumonia    Seizures (HCC)    caused by dye injected during a procedure   Shortness of breath    with exertion   Sinus problem    frequent infections/congestion   Stroke (HCC) 2021   reports having CVA in 2021 and having mini strokes before that   Thyroid disease    Past Surgical History:  Procedure Laterality Date   ABDOMINAL HYSTERECTOMY     APPENDECTOMY     CARPAL  TUNNEL RELEASE  10+ years ago   bilateral   EYE SURGERY  3 yrs ago   bilateral cataracts   FOOT OSTEOTOMY  6 weeks ago   Left foot: great, 2nd & 3rd   FOOT OSTEOTOMY  5 years ago   Right great toe   HAND SURGERY Bilateral 2011-most recent   multiple hand surgeries, 2 on left, 3 on right   KNEE ARTHROPLASTY Right 04/28/2022   Procedure: COMPUTER ASSISTED TOTAL KNEE ARTHROPLASTY;  Surgeon: Donato Heinz, MD;  Location: [facility];  Service: Orthopedics;  Laterality: Right;   LOOP RECORDER INSERTION N/A 05/16/2020   Procedure: LOOP RECORDER INSERTION;  Surgeon: Marcina Millard, MD;  Location: [facility];  Service: Cardiovascular;  Laterality: N/A;   NASAL SINUS SURGERY  most recent 7-8 yrs ago   7 sinus surgeries    TRIGGER FINGER RELEASE  11/19/2011   Procedure: RELEASE TRIGGER FINGER/A-1 PULLEY;  Surgeon: Nicki Reaper, MD;  Location: [facility];  Service: Orthopedics;  Laterality: Right;  release a-1 pulley right index finger and cyst removal  WRIST GANGLION EXCISION  1980's   right     ONSET DATE: 11/23/22  REFERRING DIAG: TIA  THERAPY DIAG:  Muscle weakness (generalized)  Other lack of coordination  Unsteadiness on feet  Difficulty in walking, not elsewhere classified  Acute left-sided weakness  Rationale for Evaluation and Treatment: Rehabilitation  SUBJECTIVE:                                                                                                                                                                                             SUBJECTIVE STATEMENT:   Pt reports that she is doing well. States that's she slept movre last night than she has in several weeks.  No other new updates. Was able to receive Referral from MD for Vestibular eval.    Pt accompanied by: significant other  PERTINENT HISTORY: [name] is an 32 year-old female who was admitted to [facility] on 11/23/2022 with a cerebellar CVA. 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 Pt. Was admitted to Inpatient Rehab from 12/21-1/02/2023. 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  Patient underwent inpatient rehabilitation for 2 weeks.  Patient was assessed and was scheduled for a knee replacement on 08/29/2022 however had to cancel it due to having had a CVA. PMH includes: anemia, arthritis, back pain, depression, hernia, GERD, HLD, HTN, hypothyroidism, knee pain, pancreatic hormone dysfunction, pneumonia, seizures, stroke, thyroid disease.  Patient has a donjoy brace for L knee.   PAIN:  Are you having pain? no  PRECAUTIONS: Fall, has latex allergy  WEIGHT BEARING RESTRICTIONS: No  FALLS: Has patient fallen in last 6 months? Yes. Number of falls 4 falls   LIVING ENVIRONMENT: Lives with: lives with their family and lives with their spouse Lives in: House/apartment Stairs: Yes: Internal: flight steps;   and External: 2 steps; on right going up and on left going up Has following equipment at home: Single point cane, Walker - 2 wheeled, Shower bench, and bed side commode  PLOF: Independent  PATIENT GOALS: to have less pain and move better  OBJECTIVE:   TODAY'S TREATMENT:  DATE: 11/12/23  Stand pivot transfer to and from arm chair with CGA and min cues for safe gait pattern to reduce fall risk   Sit<>stand 3 x 5 with UE supported on RW.   Gait with RW 4 x 90ft with supervision assist-CGA with min cues for safety in turns and obstacle navigation.   Sit<>supine with supervision assist into supine and min assist into sitting due to decreased ability to attend to the LUE in transfer.   Supine therex:  Bridge 2 x10  Heel slide 2 x 12 bil  Attempetd SLR, repots ASIS pain on the RLE.  Clam shell GTB x 12  Resisted hip flexion with knees  flexed x 10 bil  SAQ 4# x 12 bil   Sidelying hip abduction 2x 12 GTB  Sidelying clam shell GTB 2x 12  Sidelying hip extension 2x 12 AROM    PATIENT EDUCATION: Education details: Pt educated throughout session about proper posture and technique with exercises. Improved exercise technique, movement at target joints, use of target muscles after min to mod verbal, visual, tactile cues.   HOME EXERCISE PROGRAM:  Access Code: [email or web address] URL: [email or web address] Date: 10/07/2023 Prepared by: Grier Rocher  Exercises - Standing March with Counter Support  - 1 x daily - 3 x weekly - 2 sets - 10 reps - Standing Hip Abduction with Unilateral Counter Support  - 1 x daily - 3 x weekly - 2 sets - 10 reps - 2 hold - Standing Hip Extension with Unilateral Counter Support  - 1 x daily - 3 x weekly - 2 sets - 10 reps - 2 hold - Supine Straight Leg Raises  - 1 x daily - 37 x weekly - 2 sets - 10 reps - Bridge  - 1 x daily - 3 x weekly - 2 sets - 10 reps - 2 hold - Supine Heel Slide  - 1 x daily - 3 x weekly - 2 sets - 10 reps  GOALS: Goals reviewed with patient? Yes  SHORT TERM GOALS: Target date: 09/19/23  Patient will be independent in starter HEP to improve A/ROM tolerance and preserve available ROM of LLE and to advance A/ROM and strength of RLE.  Baseline: 08/20/23: defer to vists 2-3  Goal status: NEW  LONG TERM GOALS: Target date: 10/20/23  Patient will increase FOTO score to equal to or greater than 65%  to demonstrate statistically significant improvement in ease of mobility.  Baseline:  47%; 06/23/23: 60;   7/16: 60%   10/31: 54 Goal status: Progressing   2.  Patient (> 57 years old) will complete five times sit to stand test in < 15 seconds indicating an increased RLE power.  Baseline: 5/7: unable to tolerate ; 7/16: 17.4sec 10/31: 11.02 Goal status: Progressing   3.   Patient to report tolerance of ad lib AMB in home during the day of distances >70ft >5x at  supervision level or better to improve independence with ADL.    Baseline: 08/20/23: using transport chair for nearly all mobility, not tolerating walking due to WB limitations of LLE  10/31: able to ambulate 30-36ft with CGA from PT.  Goal status: Progressing     ASSESSMENT:  CLINICAL IMPRESSION:   Pt presented to PT session motivated to participate. PT focused on improve isolated LLE movement. And gait training with RW. Increased resistance added to supine therex as well as improved tolerance to gait with RW and reduced knee pain. Vestibular eval would be beneficial. The patient will  benefit from skilled physical therapy to reduce pain, improve mobility, improve strength and improve quality of life.     OBJECTIVE IMPAIRMENTS: Abnormal gait, cardiopulmonary status limiting activity, decreased activity tolerance, decreased balance, decreased cognition, decreased endurance, decreased knowledge of use of DME, decreased mobility, difficulty walking, decreased ROM, decreased strength, decreased safety awareness, hypomobility, increased fascial restrictions, impaired perceived functional ability, impaired flexibility, impaired UE functional use, improper body mechanics, postural dysfunction, and pain.   ACTIVITY LIMITATIONS: carrying, lifting, bending, sitting, standing, squatting, stairs, transfers, bed mobility, bathing, toileting, dressing, reach over head, hygiene/grooming, locomotion level, and caring for others  PARTICIPATION LIMITATIONS: meal prep, cleaning, laundry, medication management, personal finances, interpersonal relationship, driving, shopping, community activity, yard work, school, and church  PERSONAL FACTORS: Age, Behavior pattern, Education, Fitness, Past/current experiences, Time since onset of injury/illness/exacerbation, and 3+ comorbidities: anemia, arthritis, back pain, depression, hernia, GERD, HLD, HTN, hypothyroidism, knee pain, pancreatic hormone dysfunction, pneumonia,  seizures, stroke, thyroid disease  are also affecting patient's functional outcome.   REHAB POTENTIAL: Fair    CLINICAL DECISION MAKING: Evolving/moderate complexity  EVALUATION COMPLEXITY: Moderate  PLAN:  PT FREQUENCY: 1-2x/week  PT DURATION: 10 weeks  PLANNED INTERVENTIONS: Therapeutic exercises, Therapeutic activity, Neuromuscular re-education, Balance training, Gait training, Patient/Family education, Self Care, Joint mobilization, Joint manipulation, Stair training, Vestibular training, Canalith repositioning, Visual/preceptual remediation/compensation, Orthotic/Fit training, DME instructions, Electrical stimulation, Wheelchair mobility training, Spinal mobilization, Cryotherapy, Moist heat, Compression bandaging, scar mobilization, Splintting, Taping, Traction, Ultrasound, Manual therapy, and Re-evaluation  PLAN FOR NEXT SESSION:  Tolerance to standing in functional movement/ tasks.  Continue BLE strengthening in weighted and unweighted position Vestibular eval in near future .   Golden Pop, PT, DPT 11/12/2023, 4:22 PM   4:22 PM, 11/12/23  Physical Therapist - [facility]  [phone] [facility])

## 2023-11-15 ENCOUNTER — Other Ambulatory Visit: Payer: Self-pay | Admitting: Physical Medicine and Rehabilitation

## 2023-11-17 ENCOUNTER — Ambulatory Visit: Payer: Medicare HMO | Admitting: Occupational Therapy

## 2023-11-17 ENCOUNTER — Ambulatory Visit: Payer: Medicare HMO

## 2023-11-17 DIAGNOSIS — R278 Other lack of coordination: Secondary | ICD-10-CM | POA: Diagnosis not present

## 2023-11-17 DIAGNOSIS — M6281 Muscle weakness (generalized): Secondary | ICD-10-CM

## 2023-11-17 DIAGNOSIS — R2681 Unsteadiness on feet: Secondary | ICD-10-CM

## 2023-11-17 DIAGNOSIS — R531 Weakness: Secondary | ICD-10-CM | POA: Diagnosis not present

## 2023-11-17 DIAGNOSIS — R262 Difficulty in walking, not elsewhere classified: Secondary | ICD-10-CM | POA: Diagnosis not present

## 2023-11-17 NOTE — Therapy (Signed)
Occupational Therapy Neuro Treatment Note    Patient Name: [name] MRN: [id number] DOB:July 21, 1941, 82 y.o., female Today's Date: 11/17/2023  PCP: Dr. Judithann Sheen REFERRING PROVIDER:  Dr. Judithann Sheen  END OF SESSION:  PLAN:   OT End of Session - 11/17/23 1738     Visit Number 75    Number of Visits 96    Date for OT Re-Evaluation 01/21/24    OT Start Time 1536    OT Stop Time 1615    OT Time Calculation (min) 39 min    Activity Tolerance Patient tolerated treatment well    Behavior During Therapy WFL for tasks assessed/performed                   Past Medical History:  Diagnosis Date   Anemia    Arthritis    Asthma    uses inhaler just prior to surgery to avoid attack   Back pain    from previous injury   Complication of anesthesia    has woken  up during 2 different surgery   Depression    no current issue/treatment; situation   Gallstones    GERD (gastroesophageal reflux disease)    Hiatal hernia    patient does NOT have nerve/muscle disease   History of kidney stones    HLD (hyperlipidemia)    HTN (hypertension)    Hypothyroidism    Kidney stones    Knee pain    Nausea and vomiting 10/15/2022   Non-diabetic pancreatic hormone dysfunction years   pt. 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   Pancreatitis    Pneumonia    Seizures (HCC)    caused by dye injected during a procedure   Shortness of breath    with exertion   Sinus problem    frequent infections/congestion   Stroke (HCC) 2021   reports having CVA in 2021 and having mini strokes before that   Thyroid disease    Past Surgical History:  Procedure Laterality Date   ABDOMINAL HYSTERECTOMY     APPENDECTOMY     CARPAL TUNNEL RELEASE  10+ years ago   bilateral   EYE SURGERY  3 yrs ago   bilateral cataracts   FOOT OSTEOTOMY  6 weeks ago   Left foot: great, 2nd & 3rd   FOOT OSTEOTOMY  5 years ago   Right great toe   HAND SURGERY Bilateral 2011-most recent   multiple hand surgeries, 2  on left, 3 on right   KNEE ARTHROPLASTY Right 04/28/2022   Procedure: COMPUTER ASSISTED TOTAL KNEE ARTHROPLASTY;  Surgeon: Donato Heinz, MD;  Location: [facility];  Service: Orthopedics;  Laterality: Right;   LOOP RECORDER INSERTION N/A 05/16/2020   Procedure: LOOP RECORDER INSERTION;  Surgeon: Marcina Millard, MD;  Location: [facility];  Service: Cardiovascular;  Laterality: N/A;   NASAL SINUS SURGERY  most recent 7-8 yrs ago   7 sinus surgeries    TRIGGER FINGER RELEASE  11/19/2011   Procedure: RELEASE TRIGGER FINGER/A-1 PULLEY;  Surgeon: Nicki Reaper, MD;  Location: [facility];  Service: Orthopedics;  Laterality: Right;  release a-1 pulley right index finger and cyst removal   WRIST GANGLION EXCISION  1980's   right   Patient Active Problem List   Diagnosis Date Noted   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 11/27/2022   Hypomagnesemia 11/27/2022   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   HLD (hyperlipidemia) 11/23/2022   Asthma 11/23/2022   Depression with  anxiety 11/23/2022   Chronic diastolic CHF (congestive heart failure) (HCC) 11/23/2022   Normocytic anemia 11/23/2022   Aspiration pneumonia (HCC) 11/23/2022   AKI (acute kidney injury) (HCC) 11/23/2022   Abdominal pain 11/23/2022   Mesenteric mass 11/23/2022   Coffee ground emesis 11/23/2022   Nausea and vomiting 10/15/2022   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   Fatigue 09/17/2022   Left hemiparesis (HCC) 09/17/2022   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HCC) 08/22/2022   GERD (gastroesophageal reflux disease) 08/21/2022   Agitation 08/20/2022   Acute left-sided weakness 08/20/2022   Expressive aphasia    Stroke (HCC) 08/19/2022   Leukocytosis 08/19/2022   History of urticaria 04/28/2022   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   Primary osteoarthritis of left knee 02/24/2022   Primary osteoarthritis of right knee 02/24/2022   Lumbar spondylolysis 04/12/2020   History of CVA  (cerebrovascular accident) 03/26/2020   Low back pain radiating to right lower extremity 03/21/2020   B12 deficiency 03/06/2020   Positive anti-CCP test 12/21/2019   Arthralgia 12/13/2019   Dermatitis 12/13/2019   Rheumatoid factor positive 12/13/2019   Essential hypertension 12/11/2018   Palpitations 12/11/2018   Acquired hypothyroidism 11/10/2018   Arthritis of knee 09/17/2016   Anxiety 11/22/2014   Asthma without status asthmaticus 11/22/2014   Benign neoplasm of colon, unspecified 11/22/2014   Environmental allergies 11/22/2014   Hypertriglyceridemia 11/22/2014   Hypokalemia 11/22/2014   Personal history of disease of skin and subcutaneous tissue 11/22/2014   REFERRING DIAG: CVA   THERAPY DIAG:  Muscle weakness (generalized)   Other lack of coordination   Rationale for Evaluation and Treatment Rehabilitation   SUBJECTIVE:   SUBJECTIVE STATEMENT:  Pt. reports being tired.  Pt accompanied by: significant other   PERTINENT HISTORY: Patient is an 82 year-old female who was admitted to [facility] on 11/23/2022 with a cerebellar CVA. 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 Pt. Was admitted to Inpatient Rehab from 12/21-1/02/2023. 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  Patient underwent inpatient rehabilitation for 2 weeks.  Patient was assessed and was scheduled for a knee replacement on 08/29/2022 however had to cancel it due to having had a CVA.  Patient had a recent fall 2 days after discharging from inpatient rehab. Past Medical History includes: Knee replacement, essential HTN, hypokalemia, leukocytosis, seizures, positive anti-- CCP test, anxiety disorder, mini strokes.  Patient had shingles with left eye nerve pain s/p 1 year ago.    PRECAUTIONS: Fall   WEIGHT BEARING RESTRICTIONS No   PAIN:  Are you having pain?  No pain.   FALLS: Has patient fallen in last 6 months? Yes. Number  of falls 1   LIVING ENVIRONMENT: Lives with: Lives with Spouse Lives in: House/apartment Stairs: 2 storey home, resides on the first floor.  External: 2 stairs front no rails, and 6 in back with rails Has following equipment at home: Single point cane, Walker - 2 wheeled, Environmental consultant - 4 wheeled, Shower bench, and bed side commode   PLOF: Independent   PATIENT GOALS  To Regain the use of her left arm   OBJECTIVE:    HAND DOMINANCE: Right   ADLs: Overall ADLs: Husband assists pt. as needed Transfers/ambulation related to ADLs:Pt. Uses a 3 wheeled walker with Husband assist. Eating: Pt. Is independent with the right hand. Pt. has difficulty cutting food. Grooming: Pt. is using her right hand, however has difficulty sustaining her LUE in elevation to assist with haircare. UB Dressing: Pt.  Is independent donning a pullover shirt, and button down shirt. Has difficulty with buttoning, LB Dressing:  Independent donning pants, and socks. Difficulty tying shoes. Toileting: Independent Bathing: Pt. Is able to engage her right hand. Tub Shower transfers: Supervision Equipment: See above for equipment    IADLs: Shopping:  Has not had the opportunity for grocery shopping yet Light housekeeping: Husband is assisting with light house keeping Meal Prep:  Dependent Community mobility: Relies of family/friends Medication management: Husband assisting with weekly pillbox set-up, and administering medication. Financial management: TBD Handwriting: 75% legible   MOBILITY STATUS: Hx of falls   POSTURE COMMENTS:  No Significant postural limitations Sitting balance: supported sitting balance WFL   ACTIVITY TOLERANCE: Activity tolerance:  Fatigues in greater than 30 min.    FUNCTIONAL OUTCOME MEASURES: FOTO: 57   UPPER EXTREMITY ROM      Active ROM Right Eval: WFL Left eval Left  01/22/23 Left  03/03/23 Left  04/14/23 Left  05/28/2023 Left 07/23/2023 Left 08/20/2023 Left 09/17/23  Left 10/29/23  Shoulder flexion   132 100 108 108 108 60(70) 70(78) 70(102) 82(110)  Shoulder abduction   80 85 85 85 85 65(78) 72(85) 73(94) 75(98)  Shoulder adduction              Shoulder extension              Shoulder internal rotation              Shoulder external rotation              Elbow flexion   140 140 WFL [facility] WFL  Elbow extension   WNL WNL [facility] WFL WFL  Wrist flexion   65 68         Wrist extension   -10 20 24  32 32 -10 20 20 26   Wrist ulnar deviation     12 10 14 14 10 18 18 20   Wrist radial deviation     8 14 14 16 12 14 14 14   Wrist pronation              Wrist supination              (Blank rows = not tested)   Left digit flexion to [facility]: 2nd: 0cm, 3rd: 0cm, 4th: 0cm, 5th: 0cm   Limited Left full 2nd digit extension     UPPER EXTREMITY MMT:      MMT Right Eval: 4+/5 overall Right 07/23/2023 Left Eval Left 01/22/23 Left 03/03/23 Left  04/14/2023 Left 05/28/2023 Left 07/23/2023 Left 08/20/2023 Left 09/17/23 Left  10/29/23  Shoulder flexion   3+/5 3/5 3-/5 3-/5 3-/5 3-/5 2/5 2+/5 2+/5 3-/5  Shoulder abduction   3+/5 3-/5 3-/5 3-/5 3-/5 3-/5 2/5 2+/5 2+/5 3-/5  Shoulder adduction               Shoulder extension               Shoulder internal rotation               Shoulder external rotation               Middle trapezius               Lower trapezius               Elbow flexion   4/5 3+/5 4/5 N/A 4/5 4/5 3+/5 3+/5 3+/5 3+/5  Elbow extension  4/5 3+/5 4/4 N/A 4/5 4/5 3+/5 3+/5 4-/5 4-/5  Wrist flexion               Wrist extension   4/5 2-/5 3-/5 3-/5 3/5 3+/5 3-/5 3+/5 3+/5 3+/5  Wrist ulnar deviation               Wrist radial deviation               Wrist pronation               Wrist supination               (Blank rows = not tested)   HAND FUNCTION: Grip strength: Right: 26#, Left: 10# Pinch strength: Right 8#, Left: 3#, 3 Pt. Pinch strength: Right: 9#, L: 2#  01/22/2023 Grip strength: Right: 26#, Left: 12# Pinch  strength:  Pinch meter used at the initial eval has been sent out for recalibration   03/03/2023 Grip strength: Right: 26#, Left: 13# Pinch strength:  Pinch meter used at the initial eval has been sent out for recalibration  04/14/2023: Grip strength: Right: 26#, Left: 13# Pinch strength:   Right 8#, Left: 4#, 3 Pt. Pinch strength: Right: 9#, L: 3#    05/28/2023: Grip strength: Right: 26#, Left: 13# Pinch strength:   Right 8#, Left: 2#, 3 Pt. Pinch strength: Right: 9#, L: 4#  07/23/2023  Grip strength: Right: 5#, Left: 2# Pinch strength:   Right 6#, Left: 2#, 3 Pt. Pinch strength: Right: 5#, L: 2#  08/20/2023:    Grip strength: Right: 8#, Left: 4# Pinch strength:   Right 6#, Left: 2#, 3 Pt. Pinch strength: Right: 5#, L: 2#    09/17/23:  Grip strength: Right: 5#, Left: 4# Pinch strength:   Right 6#, Left: 2#, 3 Pt. Pinch strength: Right: 5#, L: 2#  10/28/2024  Grip strength: Right: 13#, Left: 6# Pinch strength:   Right 6#, Left: 2#, 3 Pt. Pinch strength: Right: 5#, L: 2#     COORDINATION: Right: 22 sec., Left: <5 min. To place 7 pegs with increased compensation proximally in the trunk, and through reflexive associated reactions.  01/22/23 Right: 22 sec., Left: 3 min. & 4 sec.  03/03/23 Right: 22 sec., Left: 1 min. & 39 sec.  04/14/23   TBD  05/28/23 Right: 22 sec., Left:  5 min.  07/23/2023  Right: 33 sec., Left:  Pt. Is unable to grasp, and place pegs into the pegboard. Pt. Was able to remove 9 vertical pegs in 34 sec.  08/20/2023:  Right: 27 sec., Left:  5 pegs placed in 5 min.; Pt. Was able to remove 9 vertical pegs in 28 sec.  09/17/23:  Right: 26 sec., Left:  3 min, & 49 sec.    10/28/2024:  Right: 26 sec., Left:  2 min, & 10 sec.        SENSATION: Light touch: WFL, proprioceptive awareness: Intact   EDEMA: N/A   MUSCLE TONE: LUE: Hypotonic   COGNITION: Overall cognitive status: WFL for tasks assessed. Pt. Is impulsive at times.    VISION: Subjective report: Pt. report having shingles affecting left eye  s/p 1 year. Has nerve pain Baseline vision: Wears glasses for reading only Visual history:  updated see clinical impression   VISION ASSESSMENT:    WFL for tasks performed   PERCEPTION: Intact   PRAXIS: Impaired: Motor planning   OBSERVATIONS:  Pt. more alert, and engaging since prior to the most recent hospitalization.  TODAY'S TREATMENT:    There. Ex.:   Pt. tolerated PROM/AAROM to the Left shoulder, elbow, wrist, and digits. Pt. worked on [employer], and reciprocal motion using the UBE while seated for 8 min. with minimal resistance.  Constant monitoring was provided.    Neuromuscular re-education:   Pt. worked on left hand [facility] skills grasping 1.5"  pegs out of a crowded container, and placing them onto the [employer] placed at a vertical angle. Pt. Worked on removing them from the board while grasping, storing a peg in her hand while grasping an additional one in her fingers.                   PATIENT EDUCATION: Education details: LUE therapeutic exercise Person educated: Patient and Spouse Education method: Medical illustrator Education comprehension: verbalized understanding, returned demonstration, and needs further education   HOME EXERCISE PROGRAM:   Reviewed activities at home to promote isolated 2nd digit extension.    GOALS: Goals reviewed with patient? Yes   SHORT TERM GOALS: Target date: 12/10/2023   1. Patient will be independent with home exercise program for the left upper extremity Baseline: 10/29/2023: Independent 09/17/23: Independent 08/20/23: Pt. continues to require assist from he husband for HEPs 07/23/2023: Pt. continues to require assist. 05/28/2023: Pt. Requires assist 04/09/2023: Pt. Continues to consistently attempt to engage her hand at hand 02/01/2023: Pt. Consistently attempts to perform HEPs independently. No current home exercise  program Goal status: Ongoing   LONG TERM GOALS: Target date:  01/21/2024  1.  Patient will improve left shoulder strength by 2 mm grades to be able to sustain UE's in elevation long enough to wash her hair.  Baseline: 10/29/2023: Left shoulder flexion 3-/5, abduction: 3-/5 10/27/2023:  Pt. Is improving with reaching up to reach her hair for hair care. Pt. Continues to have difficulty sustaining the UE in elevation long enough to perform hair care. 09/17/2023: Left shoulder 2+/5, abduction 2+/5 08/20/2023: Left shoulder flexion 2+/5, abduction 2+/5  07/23/2023: Left shoulder flexion: 2/5, abduction: 2/5. 05/28/2023: Left shoulder flexion: 3-/5, abduction: 3-/5. Pt. has an old left shoulder injury limiting progression with left shoulder strength 05/26/2023: Pt. continues to present with limitiations in sustaining LUE elevation long enough to perform hair care thoroughly 04/09/2023: Pt. is limited with sustaining LUE elevation long enough to perform hair care thoroughly. 03/03/2023: Left shoulder flexion: 3-/5, abduction: 3-/5 01/22/2023: Left shoulder flexion: 3-/5, abduction: 3-/5 Eval: Left shoulder flexion: 3/5, abduction: 3-/5 Goal status: Ongoing   2.  Patient will improve left shoulder active abduction to be able to comb her hair Baseline: 10/29/2023: Left shoulder abduction: 75(98) 10/27/2023: Pt. Is improving with reaching the left side of her hair for haircare. Pt. Has difficulty reaching with the left side to the back, and right side of her head.09/17/2023: 73(94) Pt. is able to reach her left side, and back of hair. 9/11/09/2023: 72(85) Pt. is able to reach her left side, and back of hair. 07/23/2023: Left shoulder flexion: 60(70) abduction: 65(78) 05/28/2023: left shoulder flexion: 108 abduction: 85 Pt. Has an old left shoulder injury limiting progression with left shoulder ROM 05/26/2023:  5/10 left shoulder pain with ROM limits using it functionally during hair care. 04/09/2023: Pt. Presents with difficulty  abducting her left shoulder enough to thoroughly complete haircare. 03/03/2023: Shoulder abduction: 85 01/22/2023: Shoulder abduction: 85 Eval: Left shoulder abduction is 80(108) Goal status: Ongoing   3.  Patient will independently button her shirt with modified independence. Baseline:10/29/2023: Pt. Has difficulty  manipulating buttons, will benefit from a buttonhook.10/27/23: Pt. Requires increased time to complete buttons.09/17/2023: Pt. Has a buttonhook, however does not know where it is. Pt. requires increased time to complete, and assist from her husband. 08/20/2023: Pt. requires increased time to complete, and assist from her husband. 07/23/2023 Pt. presents with difficulty buttoning her shirt. 05/28/2023: pt. Continues to work towards progressing with buttoning 05/26/2023:  Pt. Continues to progress towards buttoning. 04/09/2023: Pt. Continues to progress towards buttoning. 03/03/2023: Pt. Continues to have difficulty with buttoning. 01/22/2023: Pt. continues to have difficulty. Eval: Patient has difficulty.  Goal status: Ongoing   4.  Patient well improve left grip strength in preparatio for securely hold a glass/beverage Baseline: 10/29/2023: Grip strength: Right: 13#  Left: 6#10/27/2023: Pt. Has difficulty securely holding heavier items with larger diameter. 09/17/2023: Grip strength: Right: 5#, Left: 4# Pt. Has difficulty holding a glass. Right: 8#, Left: 4# Pt. Is able to more securely hold mirrors, and flowers with the left hand, however has difficulty holding a glass beverage, or bottle. 08/20/2023: Right: 8#, Left: 4# Pt. Is able to more securely hold mirrors, and flowers with the left hand, however has difficulty holding a glass beverage, or bottle. 07/23/2023: Grip strength: Right: 5#, Left: 2# 05/28/2023: Right: 26#, Left: 13#  pt. Presents with difficulty securely holding flowers in her left hand. 05/26/2023: TBD 04/09/2023: Pt. Is able to hold, and hike pants with the left hand, continues to have  difficulty with securely holding flowers.   03/03/2023: left grip strength: 13# 01/22/2023: Left: 12# Eval: Pt. Is unable to securely hold flowers. Goal status: Ongoing   5.  Pt. will independently recall adaptive  strategies for performing ADL/ADL kitchen tasks.  Baseline:10/29/2023: Continue 11/19/204: Continue 08/20/2023: Revised to include IADL kitchen tasks.  07/23/2023: Continue 05/28/2023: Pt. Needs continued education about adaptive strategies.05/26/2023: Continue 04/09/2023: Continue3/26/2024: Continue 01/22/2023: Pt. continues to benefit from education about adaptive strategies during ADLs, and IADLs. Eval: Pt. to be provided with adaptive strategies. Goal status: Revised   6.  Pt. will improve FOTO score by 2 points to reflect patient perceived performance improvement assessment specific ADLs  and IADLs Baseline: 16/09/9603: 66 10/27/2023: TBD 09/17/2023: FOTO score: 61 07/23/2023: FOTO score: 57 05/28/2023: FOTO score 59 05/26/2023: TBD 5/02/204:  TBD 03/03/23: FOTO 62 Eval: 57 Goal status: Ongoing  7.  Pt. will improve left hand coordination skills in order to be able to  manipulate small objects.     Baseline: 10/29/2023 Left: 2 min. & 10 sec. 10/27/2023: Pt. Is improving with manipulating small objects.09/17/2023: Right: 26 sec., Left:  3 min, & 49 sec. pegs placed in 5 min.; Pt. Was able to remove 9 vertical pegs in 24 sec. 08/20/2023: Right: 27 sec., Left:  5 pegs placed in 5 min.; Pt. Was able to remove 9 vertical pegs in 28 sec. Pt. Is able to hold, and sort utensils in a drawer. Pt. Has difficulty manipulating small objects. 07/23/2023: Right: 33 sec., Left:  Pt. Is unable to grasp, and place pegs into the pegboard. Pt. Was able to remove 9 vertical pegs in 34 sec. 05/28/2023: Left:  5 min. 05/26/2023 Pt. Continues to present with difficulty handling and sorting utensils. 04/14/2023: 56 04/09/2023: TBD 03/03/23: 1 min. & 39 01/22/2022: Left: 3 min. & 4 sec. Eval: Pt. has difficulty sorting, and placing  utensils with the left hand. Left FMC : >5 min. For 7 pegs on the 9 hole peg test.    Goal Status: Ongoing  8. Pt. will  improve active left 2nd digit extension to be able able to isolate her 2nd digit in preparation for pressing/pushing buttons on appliances, phones, or remotes. Baseline: 10/29/2023: Pt. Is improving with 2nd digit isolation, to however continues to present with limited prolonged 2nd digit extension11/19/2024: Pt. Is improving with 2nd digit isolation, to however presents with limited prolonged 2nd digit extension. 09/17/2023: Pt. Presents with limited isolated 2nd digit extension with increased time. 08/20/2023: Pt. Is more consistently able to isolate 2nd digit extension with increased time. 07/23/2023: Pt. Continues to work on consistency with isolating left 2nd digit extension 05/28/2023: Pt. Continues to improve with left 2nd digit extension 05/26/2023: Improving with left 2nd digit extension. 04/09/2023: Pt. Is progressing with isolating left 2nd digit extension, however continues to present with limited increased flexor tone. 03/03/23: Pt. Continues to work on improving consistency with 2nd digit extension to press the remote. 01/22/2023: is able to perform full digit extension, although 2nd digit is slow to extend s 2/2 flexor tone. Pt.  Eval: Pt. is able to is unable to actively perform full digit extension Goal status: Ongoing  9: Pt. Will cut food with modified independence Baseline: 10/29/2023: : Pt. Continues to have difficulty cutting food on her plate.10/27/2023: Pt. Continues to have difficulty cutting food on her plate. 09/17/2023: Pt. Is unable to cut the food on her plate, as well as cut food for meal preparation. 08/20/2023: Pt. Is unable to cut the food on her plate, as well as cut food for meal preparation.    Goal Status: Ongoing   ASSESSMENT:   CLINICAL IMPRESSION:  Pt. reports no pain today. Pt. required cues, and assist initially when reaching up, and grasping the  pegs and placing them into the board, as well as removing them from the board. Pt. dropped multiple pegs initially, however was able to successfully complete the task as the task progressed. Pt. Required multiple rest breaks. Pt. continues to work on improving LUE ROM, strength, FMC, and hand function skills in order to work towards improving, and maximizing independence with ADLs, and IADL tasks.   PERFORMANCE DEFICITS in functional skills including ADLs, IADLs, coordination, proprioception, ROM, strength, FMC, and GMC, cognitive skills including memory, and psychosocial skills including coping strategies, environmental adaptation, interpersonal interactions, and routines and behaviors.    IMPAIRMENTS are limiting patient from ADLs, IADLs, education, leisure, and social participation.    COMORBIDITIES may have co-morbidities  that affects occupational performance. Patient will benefit from skilled OT to address above impairments and improve overall function.   MODIFICATION OR ASSISTANCE TO COMPLETE EVALUATION: Min-Moderate modification of tasks or assist with assess necessary to complete an evaluation.   OT OCCUPATIONAL PROFILE AND HISTORY: Detailed assessment: Review of records and additional review of physical, cognitive, psychosocial history related to current functional performance.   CLINICAL DECISION MAKING: Moderate - several treatment options, min-mod task modification necessary   REHAB POTENTIAL: Good   EVALUATION COMPLEXITY: Moderate      PLAN: OT FREQUENCY: 2x/week   OT DURATION: 12 weeks   PLANNED INTERVENTIONS: self care/ADL training, therapeutic exercise, therapeutic activity, neuromuscular re-education, manual therapy, passive range of motion, functional mobility training, electrical stimulation, and paraffin   RECOMMENDED OTHER SERVICES: PT   CONSULTED AND AGREED WITH PLAN OF CARE: Patient and family member/caregiver   PLAN FOR NEXT SESSION: see above   Olegario Messier, MS, OTR/L  11/17/23, 5:40 PM

## 2023-11-17 NOTE — Therapy (Signed)
OUTPATIENT PHYSICAL THERAPY TREATMENT    Patient Name: [name] MRN: [id number] DOB:Nov 17, 1941, 82 y.o., female Today's Date: 11/17/2023   PCP: Aram Beecham, D MD REFERRING PROVIDER: Jacquelynn Cree PA   END OF SESSION:  PT End of Session - 11/17/23 1623     Visit Number 19    Number of Visits 31    Date for PT Re-Evaluation 12/24/23    Authorization Type [employer] Medicare:    Authorization Time Period approved for 24 additional tx from 09/28/23 to 12/24/23; total visit count 31    Progress Note Due on Visit 20    PT Start Time 1617    PT Stop Time 1657    PT Time Calculation (min) 40 min    Equipment Utilized During Treatment Gait belt    Activity Tolerance Patient tolerated treatment well;No increased pain    Behavior During Therapy WFL for tasks assessed/performed                 Past Medical History:  Diagnosis Date   Anemia    Arthritis    Asthma    uses inhaler just prior to surgery to avoid attack   Back pain    from previous injury   Complication of anesthesia    has woken  up during 2 different surgery   Depression    no current issue/treatment; situation   Gallstones    GERD (gastroesophageal reflux disease)    Hiatal hernia    patient does NOT have nerve/muscle disease   History of kidney stones    HLD (hyperlipidemia)    HTN (hypertension)    Hypothyroidism    Kidney stones    Knee pain    Nausea and vomiting 10/15/2022   Non-diabetic pancreatic hormone dysfunction years   pt. 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   Pancreatitis    Pneumonia    Seizures (HCC)    caused by dye injected during a procedure   Shortness of breath    with exertion   Sinus problem    frequent infections/congestion   Stroke (HCC) 2021   reports having CVA in 2021 and having mini strokes before that   Thyroid disease    Past Surgical History:  Procedure Laterality Date   ABDOMINAL HYSTERECTOMY     APPENDECTOMY     CARPAL TUNNEL RELEASE  10+  years ago   bilateral   EYE SURGERY  3 yrs ago   bilateral cataracts   FOOT OSTEOTOMY  6 weeks ago   Left foot: great, 2nd & 3rd   FOOT OSTEOTOMY  5 years ago   Right great toe   HAND SURGERY Bilateral 2011-most recent   multiple hand surgeries, 2 on left, 3 on right   KNEE ARTHROPLASTY Right 04/28/2022   Procedure: COMPUTER ASSISTED TOTAL KNEE ARTHROPLASTY;  Surgeon: Donato Heinz, MD;  Location: [facility];  Service: Orthopedics;  Laterality: Right;   LOOP RECORDER INSERTION N/A 05/16/2020   Procedure: LOOP RECORDER INSERTION;  Surgeon: Marcina Millard, MD;  Location: [facility];  Service: Cardiovascular;  Laterality: N/A;   NASAL SINUS SURGERY  most recent 7-8 yrs ago   7 sinus surgeries    TRIGGER FINGER RELEASE  11/19/2011   Procedure: RELEASE TRIGGER FINGER/A-1 PULLEY;  Surgeon: Nicki Reaper, MD;  Location: [facility];  Service: Orthopedics;  Laterality: Right;  release a-1 pulley right index finger and cyst removal   WRIST GANGLION EXCISION  1980's   right  ONSET DATE: 11/23/22  REFERRING DIAG: TIA  THERAPY DIAG:  Muscle weakness (generalized)  Other lack of coordination  Unsteadiness on feet  Difficulty in walking, not elsewhere classified  Rationale for Evaluation and Treatment: Rehabilitation  SUBJECTIVE:                                                                                                                                                                                             SUBJECTIVE STATEMENT:  Pt tired today; got up early to go shopping with husband for holiday prep. She comes directly from OT, is low on energy.   Pt accompanied by: significant other  PERTINENT HISTORY: [name] is an 56 year-old female who was admitted to [facility] on 11/23/2022 with a cerebellar CVA. 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 Pt. Was admitted to Inpatient Rehab from 12/21-1/02/2023. 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  Patient underwent inpatient rehabilitation for 2 weeks.  Patient was assessed and was scheduled for a knee replacement on 08/29/2022 however had to cancel it due to having had a CVA. PMH includes: anemia, arthritis, back pain, depression, hernia, GERD, HLD, HTN, hypothyroidism, knee pain, pancreatic hormone dysfunction, pneumonia, seizures, stroke, thyroid disease.  Patient has a donjoy brace for L knee.   PAIN:  Are you having pain? no  PRECAUTIONS: Fall, has latex allergy  WEIGHT BEARING RESTRICTIONS: No  FALLS: Has patient fallen in last 6 months? Yes. Number of falls 4 falls   LIVING ENVIRONMENT: Lives with: lives with their family and lives with their spouse Lives in: House/apartment Stairs: Yes: Internal: flight steps;   and External: 2 steps; on right going up and on left going up Has following equipment at home: Single point cane, Walker - 2 wheeled, Shower bench, and bed side commode  PLOF: Independent  PATIENT GOALS: to have less pain and move better  OBJECTIVE:   TODAY'S TREATMENT:  DATE: 11/17/23  -STS from transport chair 1x8 -seated LAQ 1x15 bilat -seated brace marching 1x10 bilat  -STS from transport chair 1x8 -seated LAQ 1x15 bilat -seated brace marching 1x10 bilat  -AMB from plinth to // bars 1x74ft c YRW -in // bars alternate fwd/tretro stepping 5x each  *sit break  -side stepping in bars, 2 hands on bar, crocs on feet 3x bilat, then asks for a fatigue break  -AMB overground c RW 15ft *sit break  AMB circle around 2 chairs c YRW 4x total, alternating direction     PATIENT EDUCATION: Education details: Pt educated throughout session about proper posture and technique with exercises. Improved exercise technique, movement at target joints, use of  target muscles after min to mod verbal, visual, tactile cues.   HOME EXERCISE PROGRAM:  Access Code: [email or web address] URL: [email or web address] Date: 10/07/2023 Prepared by: Grier Rocher  Exercises - Standing March with Counter Support  - 1 x daily - 3 x weekly - 2 sets - 10 reps - Standing Hip Abduction with Unilateral Counter Support  - 1 x daily - 3 x weekly - 2 sets - 10 reps - 2 hold - Standing Hip Extension with Unilateral Counter Support  - 1 x daily - 3 x weekly - 2 sets - 10 reps - 2 hold - Supine Straight Leg Raises  - 1 x daily - 37 x weekly - 2 sets - 10 reps - Bridge  - 1 x daily - 3 x weekly - 2 sets - 10 reps - 2 hold - Supine Heel Slide  - 1 x daily - 3 x weekly - 2 sets - 10 reps  GOALS: Goals reviewed with patient? Yes  SHORT TERM GOALS: Target date: 09/19/23  Patient will be independent in starter HEP to improve A/ROM tolerance and preserve available ROM of LLE and to advance A/ROM and strength of RLE.  Baseline: 08/20/23: defer to vists 2-3  Goal status: NEW  LONG TERM GOALS: Target date: 10/20/23  Patient will increase FOTO score to equal to or greater than 65%  to demonstrate statistically significant improvement in ease of mobility.  Baseline:  47%; 06/23/23: 60;   7/16: 60%   10/31: 54 Goal status: Progressing   2.  Patient (> 15 years old) will complete five times sit to stand test in < 15 seconds indicating an increased RLE power.  Baseline: 5/7: unable to tolerate ; 7/16: 17.4sec 10/31: 11.02 Goal status: Progressing   3.   Patient to report tolerance of ad lib AMB in home during the day of distances >46ft >5x at supervision level or better to improve independence with ADL.    Baseline: 08/20/23: using transport chair for nearly all mobility, not tolerating walking due to WB limitations of LLE  10/31: able to ambulate 30-78ft with CGA from PT.  Goal status: Progressing     ASSESSMENT:  CLINICAL IMPRESSION:    Continued with strength,  motor control for transfers, and gait training. Pt fatigued from OT, but very engaged. Pt able to advance AMB distance today, volume as well. The patient will benefit from skilled physical therapy to reduce pain, improve mobility, improve strength and improve quality of life.     OBJECTIVE IMPAIRMENTS: Abnormal gait, cardiopulmonary status limiting activity, decreased activity tolerance, decreased balance, decreased cognition, decreased endurance, decreased knowledge of use of DME, decreased mobility, difficulty walking, decreased ROM, decreased strength, decreased safety awareness, hypomobility, increased fascial restrictions, impaired perceived functional ability, impaired flexibility, impaired UE functional use, improper  body mechanics, postural dysfunction, and pain.   ACTIVITY LIMITATIONS: carrying, lifting, bending, sitting, standing, squatting, stairs, transfers, bed mobility, bathing, toileting, dressing, reach over head, hygiene/grooming, locomotion level, and caring for others  PARTICIPATION LIMITATIONS: meal prep, cleaning, laundry, medication management, personal finances, interpersonal relationship, driving, shopping, community activity, yard work, school, and church  PERSONAL FACTORS: Age, Behavior pattern, Education, Fitness, Past/current experiences, Time since onset of injury/illness/exacerbation, and 3+ comorbidities: anemia, arthritis, back pain, depression, hernia, GERD, HLD, HTN, hypothyroidism, knee pain, pancreatic hormone dysfunction, pneumonia, seizures, stroke, thyroid disease  are also affecting patient's functional outcome.   REHAB POTENTIAL: Fair    CLINICAL DECISION MAKING: Evolving/moderate complexity  EVALUATION COMPLEXITY: Moderate  PLAN:  PT FREQUENCY: 1-2x/week  PT DURATION: 10 weeks  PLANNED INTERVENTIONS: Therapeutic exercises, Therapeutic activity, Neuromuscular re-education, Balance training, Gait training, Patient/Family education, Self Care, Joint  mobilization, Joint manipulation, Stair training, Vestibular training, Canalith repositioning, Visual/preceptual remediation/compensation, Orthotic/Fit training, DME instructions, Electrical stimulation, Wheelchair mobility training, Spinal mobilization, Cryotherapy, Moist heat, Compression bandaging, scar mobilization, Splintting, Taping, Traction, Ultrasound, Manual therapy, and Re-evaluation  PLAN FOR NEXT SESSION:  Tolerance to standing in functional movement/ tasks.  Continue BLE strengthening in weighted and unweighted position   Dacy Enrico C, PT, DPT 11/17/2023, 4:26 PM   4:26 PM, 11/17/23  Physical Therapist - [facility]  [phone] [facility])

## 2023-11-19 ENCOUNTER — Ambulatory Visit: Payer: Medicare HMO | Admitting: Occupational Therapy

## 2023-11-19 ENCOUNTER — Ambulatory Visit: Payer: Medicare HMO

## 2023-11-19 DIAGNOSIS — R2681 Unsteadiness on feet: Secondary | ICD-10-CM | POA: Diagnosis not present

## 2023-11-19 DIAGNOSIS — R278 Other lack of coordination: Secondary | ICD-10-CM | POA: Diagnosis not present

## 2023-11-19 DIAGNOSIS — R531 Weakness: Secondary | ICD-10-CM | POA: Diagnosis not present

## 2023-11-19 DIAGNOSIS — M6281 Muscle weakness (generalized): Secondary | ICD-10-CM

## 2023-11-19 DIAGNOSIS — R262 Difficulty in walking, not elsewhere classified: Secondary | ICD-10-CM | POA: Diagnosis not present

## 2023-11-19 NOTE — Therapy (Signed)
OUTPATIENT PHYSICAL THERAPY TREATMENT    Patient Name: [name] MRN: [id number] DOB:03/19/1941, 82 y.o., female Today's Date: 11/19/2023   PCP: Aram Beecham, D MD REFERRING PROVIDER: Jacquelynn Cree PA   END OF SESSION:  PT End of Session - 11/19/23 1620     Visit Number 20    Number of Visits 31    Date for PT Re-Evaluation 12/24/23    Authorization Type [employer] Medicare:    Authorization Time Period approved for 24 additional tx from 09/28/23 to 12/24/23; total visit count 31    Progress Note Due on Visit 30    PT Start Time 1618    PT Stop Time 1700    PT Time Calculation (min) 42 min    Equipment Utilized During Treatment Gait belt    Activity Tolerance Patient tolerated treatment well;No increased pain    Behavior During Therapy WFL for tasks assessed/performed                 Past Medical History:  Diagnosis Date   Anemia    Arthritis    Asthma    uses inhaler just prior to surgery to avoid attack   Back pain    from previous injury   Complication of anesthesia    has woken  up during 2 different surgery   Depression    no current issue/treatment; situation   Gallstones    GERD (gastroesophageal reflux disease)    Hiatal hernia    patient does NOT have nerve/muscle disease   History of kidney stones    HLD (hyperlipidemia)    HTN (hypertension)    Hypothyroidism    Kidney stones    Knee pain    Nausea and vomiting 10/15/2022   Non-diabetic pancreatic hormone dysfunction years   pt. 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   Pancreatitis    Pneumonia    Seizures (HCC)    caused by dye injected during a procedure   Shortness of breath    with exertion   Sinus problem    frequent infections/congestion   Stroke (HCC) 2021   reports having CVA in 2021 and having mini strokes before that   Thyroid disease    Past Surgical History:  Procedure Laterality Date   ABDOMINAL HYSTERECTOMY     APPENDECTOMY     CARPAL TUNNEL RELEASE  10+  years ago   bilateral   EYE SURGERY  3 yrs ago   bilateral cataracts   FOOT OSTEOTOMY  6 weeks ago   Left foot: great, 2nd & 3rd   FOOT OSTEOTOMY  5 years ago   Right great toe   HAND SURGERY Bilateral 2011-most recent   multiple hand surgeries, 2 on left, 3 on right   KNEE ARTHROPLASTY Right 04/28/2022   Procedure: COMPUTER ASSISTED TOTAL KNEE ARTHROPLASTY;  Surgeon: Donato Heinz, MD;  Location: [facility];  Service: Orthopedics;  Laterality: Right;   LOOP RECORDER INSERTION N/A 05/16/2020   Procedure: LOOP RECORDER INSERTION;  Surgeon: Marcina Millard, MD;  Location: [facility];  Service: Cardiovascular;  Laterality: N/A;   NASAL SINUS SURGERY  most recent 7-8 yrs ago   7 sinus surgeries    TRIGGER FINGER RELEASE  11/19/2011   Procedure: RELEASE TRIGGER FINGER/A-1 PULLEY;  Surgeon: Nicki Reaper, MD;  Location: [facility];  Service: Orthopedics;  Laterality: Right;  release a-1 pulley right index finger and cyst removal   WRIST GANGLION EXCISION  1980's   right  ONSET DATE: 11/23/22  REFERRING DIAG: TIA  THERAPY DIAG:  Muscle weakness (generalized)  Unsteadiness on feet  Difficulty in walking, not elsewhere classified  Rationale for Evaluation and Treatment: Rehabilitation  SUBJECTIVE:                                                                                                                                                                                             SUBJECTIVE STATEMENT:   Pt reports she is doing well other than the R UE being sore from overuse with pulling while in wheelchair.  Pt accompanied by: significant other  PERTINENT HISTORY: [name] is an 53 year-old female who was admitted to [facility] on 11/23/2022 with a cerebellar CVA. 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 Pt. Was admitted to Inpatient Rehab from 12/21-1/02/2023. 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  Patient underwent inpatient rehabilitation for 2 weeks.  Patient was assessed and was scheduled for a knee replacement on 08/29/2022 however had to cancel it due to having had a CVA. PMH includes: anemia, arthritis, back pain, depression, hernia, GERD, HLD, HTN, hypothyroidism, knee pain, pancreatic hormone dysfunction, pneumonia, seizures, stroke, thyroid disease.  Patient has a donjoy brace for L knee.   PAIN:  Are you having pain? no  PRECAUTIONS: Fall, has latex allergy  WEIGHT BEARING RESTRICTIONS: No  FALLS: Has patient fallen in last 6 months? Yes. Number of falls 4 falls   LIVING ENVIRONMENT: Lives with: lives with their family and lives with their spouse Lives in: House/apartment Stairs: Yes: Internal: flight steps;   and External: 2 steps; on right going up and on left going up Has following equipment at home: Single point cane, Walker - 2 wheeled, Shower bench, and bed side commode  PLOF: Independent  PATIENT GOALS: to have less pain and move better  OBJECTIVE:   TODAY'S TREATMENT: DATE: 11/19/23   TherEx:  Seated LAQ with 2.5# AW donned, 2x10 each LE Seated marches with 2.5# AW donned, 2x10 each LE Seated hip adduction into rainbow physioball, 2x10  STS 2x5 for improved LE strengthening  TherAct:  Gait around the gym with supervision and CGA at times with minimal cuing for safety and navigation within the hallways, 150' on first attempt, 255' on second attempt  Goal assessment performed and noted below:      PATIENT EDUCATION: Education details: Pt educated throughout session about proper posture and technique with exercises. Improved exercise technique, movement at target joints, use of target muscles after min to mod verbal, visual, tactile cues.   HOME EXERCISE PROGRAM:  Access Code:  [email or web address] URL: [email or web address] Date: 10/07/2023 Prepared by: Grier Rocher  Exercises - Standing March with  Counter Support  - 1 x daily - 3 x weekly - 2 sets - 10 reps - Standing Hip Abduction with Unilateral Counter Support  - 1 x daily - 3 x weekly - 2 sets - 10 reps - 2 hold - Standing Hip Extension with Unilateral Counter Support  - 1 x daily - 3 x weekly - 2 sets - 10 reps - 2 hold - Supine Straight Leg Raises  - 1 x daily - 37 x weekly - 2 sets - 10 reps - Bridge  - 1 x daily - 3 x weekly - 2 sets - 10 reps - 2 hold - Supine Heel Slide  - 1 x daily - 3 x weekly - 2 sets - 10 reps  GOALS: Goals reviewed with patient? Yes  SHORT TERM GOALS: Target date: 09/19/23  Patient will be independent in starter HEP to improve A/ROM tolerance and preserve available ROM of LLE and to advance A/ROM and strength of RLE.  Baseline: 08/20/23: defer to vists 2-3  Goal status: NEW  LONG TERM GOALS: Target date: 10/20/23  Patient will increase FOTO score to equal to or greater than 65%  to demonstrate statistically significant improvement in ease of mobility.  Baseline:  47%; 06/23/23: 60;  7/16: 60%  10/31: 54 11/19/23: 56 Goal status: PROGRESSING   2.  Patient (> 20 years old) will complete five times sit to stand test in < 15 seconds indicating an increased RLE power.  Baseline: 5/7: unable to tolerate 7/16: 17.4 sec 10/31: 11.02 sec 11/19/23: 13.06 sec Goal status: PROGRESSING   3.   Patient to report tolerance of ad lib AMB in home during the day of distances >37ft >5x at supervision level or better to improve independence with ADL.    Baseline: 08/20/23: using transport chair for nearly all mobility, not tolerating walking due to WB limitations of LLE  10/31: able to ambulate 30-42ft with CGA from PT.  11/19/23: Pt able to ambulate 255.3' with CGA, FWW, and chair follow Goal status: PROGRESSING      ASSESSMENT:  CLINICAL IMPRESSION:    Pt put forth great effort throughout the session and progress note.  Pt has made good progress towards overall goals, however has not been able to meet  them at this time.  Pt is continuing to improve as therapy progresses.  Patient's condition has the potential to improve in response to therapy. Maximum improvement is yet to be obtained. The anticipated improvement is attainable and reasonable in a generally predictable time.   Pt will continue to benefit from skilled therapy to address remaining deficits in order to improve overall QoL and return to PLOF.     OBJECTIVE IMPAIRMENTS: Abnormal gait, cardiopulmonary status limiting activity, decreased activity tolerance, decreased balance, decreased cognition, decreased endurance, decreased knowledge of use of DME, decreased mobility, difficulty walking, decreased ROM, decreased strength, decreased safety awareness, hypomobility, increased fascial restrictions, impaired perceived functional ability, impaired flexibility, impaired UE functional use, improper body mechanics, postural dysfunction, and pain.   ACTIVITY LIMITATIONS: carrying, lifting, bending, sitting, standing, squatting, stairs, transfers, bed mobility, bathing, toileting, dressing, reach over head, hygiene/grooming, locomotion level, and caring for others  PARTICIPATION LIMITATIONS: meal prep, cleaning, laundry, medication management, personal finances, interpersonal relationship, driving, shopping, community activity, yard work, school, and church  PERSONAL FACTORS: Age, Behavior pattern, Education, Fitness, Past/current experiences, Time since onset of injury/illness/exacerbation, and 3+ comorbidities: anemia,  arthritis, back pain, depression, hernia, GERD, HLD, HTN, hypothyroidism, knee pain, pancreatic hormone dysfunction, pneumonia, seizures, stroke, thyroid disease  are also affecting patient's functional outcome.   REHAB POTENTIAL: Fair    CLINICAL DECISION MAKING: Evolving/moderate complexity  EVALUATION COMPLEXITY: Moderate  PLAN:  PT FREQUENCY: 1-2x/week  PT DURATION: 10 weeks  PLANNED INTERVENTIONS: Therapeutic  exercises, Therapeutic activity, Neuromuscular re-education, Balance training, Gait training, Patient/Family education, Self Care, Joint mobilization, Joint manipulation, Stair training, Vestibular training, Canalith repositioning, Visual/preceptual remediation/compensation, Orthotic/Fit training, DME instructions, Electrical stimulation, Wheelchair mobility training, Spinal mobilization, Cryotherapy, Moist heat, Compression bandaging, scar mobilization, Splintting, Taping, Traction, Ultrasound, Manual therapy, and Re-evaluation   PLAN FOR NEXT SESSION:  Tolerance to standing in functional movement/ tasks.  Continue BLE strengthening in weighted and unweighted position   Nolon Bussing, PT, DPT Physical Therapist - [facility]  11/19/23, 6:03 PM

## 2023-11-19 NOTE — Therapy (Signed)
Occupational Therapy Neuro Treatment Note    Patient Name: [name] MRN: [id number] DOB:03-Feb-1941, 82 y.o., female Today's Date: 11/19/2023  PCP: Dr. Judithann Sheen REFERRING PROVIDER:  Dr. Judithann Sheen  END OF SESSION:  PLAN:   OT End of Session - 11/19/23 2310     Visit Number [id number]    Number of Visits 96    Date for OT Re-Evaluation 01/21/24    OT Start Time 1530    OT Stop Time 1615    OT Time Calculation (min) 45 min    Equipment Utilized During Treatment Wheelchair    Activity Tolerance Patient tolerated treatment well    Behavior During Therapy WFL for tasks assessed/performed                   Past Medical History:  Diagnosis Date   Anemia    Arthritis    Asthma    uses inhaler just prior to surgery to avoid attack   Back pain    from previous injury   Complication of anesthesia    has woken  up during 2 different surgery   Depression    no current issue/treatment; situation   Gallstones    GERD (gastroesophageal reflux disease)    Hiatal hernia    patient does NOT have nerve/muscle disease   History of kidney stones    HLD (hyperlipidemia)    HTN (hypertension)    Hypothyroidism    Kidney stones    Knee pain    Nausea and vomiting 10/15/2022   Non-diabetic pancreatic hormone dysfunction years   pt. 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   Pancreatitis    Pneumonia    Seizures (HCC)    caused by dye injected during a procedure   Shortness of breath    with exertion   Sinus problem    frequent infections/congestion   Stroke (HCC) 2021   reports having CVA in 2021 and having mini strokes before that   Thyroid disease    Past Surgical History:  Procedure Laterality Date   ABDOMINAL HYSTERECTOMY     APPENDECTOMY     CARPAL TUNNEL RELEASE  10+ years ago   bilateral   EYE SURGERY  3 yrs ago   bilateral cataracts   FOOT OSTEOTOMY  6 weeks ago   Left foot: great, 2nd & 3rd   FOOT OSTEOTOMY  5 years ago   Right great toe   HAND SURGERY  Bilateral 2011-most recent   multiple hand surgeries, 2 on left, 3 on right   KNEE ARTHROPLASTY Right 04/28/2022   Procedure: COMPUTER ASSISTED TOTAL KNEE ARTHROPLASTY;  Surgeon: Donato Heinz, MD;  Location: [facility];  Service: Orthopedics;  Laterality: Right;   LOOP RECORDER INSERTION N/A 05/16/2020   Procedure: LOOP RECORDER INSERTION;  Surgeon: Marcina Millard, MD;  Location: [facility];  Service: Cardiovascular;  Laterality: N/A;   NASAL SINUS SURGERY  most recent 7-8 yrs ago   7 sinus surgeries    TRIGGER FINGER RELEASE  11/19/2011   Procedure: RELEASE TRIGGER FINGER/A-1 PULLEY;  Surgeon: Nicki Reaper, MD;  Location: [facility];  Service: Orthopedics;  Laterality: Right;  release a-1 pulley right index finger and cyst removal   WRIST GANGLION EXCISION  1980's   right   Patient Active Problem List   Diagnosis Date Noted   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 11/27/2022   Hypomagnesemia 11/27/2022   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   HLD (hyperlipidemia) 11/23/2022  Asthma 11/23/2022   Depression with anxiety 11/23/2022   Chronic diastolic CHF (congestive heart failure) (HCC) 11/23/2022   Normocytic anemia 11/23/2022   Aspiration pneumonia (HCC) 11/23/2022   AKI (acute kidney injury) (HCC) 11/23/2022   Abdominal pain 11/23/2022   Mesenteric mass 11/23/2022   Coffee ground emesis 11/23/2022   Nausea and vomiting 10/15/2022   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   Fatigue 09/17/2022   Left hemiparesis (HCC) 09/17/2022   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HCC) 08/22/2022   GERD (gastroesophageal reflux disease) 08/21/2022   Agitation 08/20/2022   Acute left-sided weakness 08/20/2022   Expressive aphasia    Stroke (HCC) 08/19/2022   Leukocytosis 08/19/2022   History of urticaria 04/28/2022   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   Primary osteoarthritis of left knee 02/24/2022   Primary osteoarthritis of right knee  02/24/2022   Lumbar spondylolysis 04/12/2020   History of CVA (cerebrovascular accident) 03/26/2020   Low back pain radiating to right lower extremity 03/21/2020   B12 deficiency 03/06/2020   Positive anti-CCP test 12/21/2019   Arthralgia 12/13/2019   Dermatitis 12/13/2019   Rheumatoid factor positive 12/13/2019   Essential hypertension 12/11/2018   Palpitations 12/11/2018   Acquired hypothyroidism 11/10/2018   Arthritis of knee 09/17/2016   Anxiety 11/22/2014   Asthma without status asthmaticus 11/22/2014   Benign neoplasm of colon, unspecified 11/22/2014   Environmental allergies 11/22/2014   Hypertriglyceridemia 11/22/2014   Hypokalemia 11/22/2014   Personal history of disease of skin and subcutaneous tissue 11/22/2014   REFERRING DIAG: CVA   THERAPY DIAG:  Muscle weakness (generalized)   Other lack of coordination   Rationale for Evaluation and Treatment Rehabilitation   SUBJECTIVE:   SUBJECTIVE STATEMENT:  Pt. Reports that her right harm hurt this week. Pt. Reports pulling herself through doorways, over thresholds while in her wheelchair at home.  Pt accompanied by: significant other   PERTINENT HISTORY: Patient is an 82 year-old female who was admitted to [facility] on 11/23/2022 with a cerebellar CVA. 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 Pt. Was admitted to Inpatient Rehab from 12/21-1/02/2023. 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  Patient underwent inpatient rehabilitation for 2 weeks.  Patient was assessed and was scheduled for a knee replacement on 08/29/2022 however had to cancel it due to having had a CVA.  Patient had a recent fall 2 days after discharging from inpatient rehab. Past Medical History includes: Knee replacement, essential HTN, hypokalemia, leukocytosis, seizures, positive anti-- CCP test, anxiety disorder, mini strokes.  Patient had shingles with left eye nerve  pain s/p 1 year ago.    PRECAUTIONS: Fall   WEIGHT BEARING RESTRICTIONS No   PAIN:  Are you having pain?  No pain.   FALLS: Has patient fallen in last 6 months? Yes. Number of falls 1   LIVING ENVIRONMENT: Lives with: Lives with Spouse Lives in: House/apartment Stairs: 2 storey home, resides on the first floor.  External: 2 stairs front no rails, and 6 in back with rails Has following equipment at home: Single point cane, Walker - 2 wheeled, Environmental consultant - 4 wheeled, Shower bench, and bed side commode   PLOF: Independent   PATIENT GOALS  To Regain the use of her left arm   OBJECTIVE:    HAND DOMINANCE: Right   ADLs: Overall ADLs: Husband assists pt. as needed Transfers/ambulation related to ADLs:Pt. Uses a 3 wheeled walker with Husband assist. Eating: Pt. Is independent with the right hand. Pt. has  difficulty cutting food. Grooming: Pt. is using her right hand, however has difficulty sustaining her LUE in elevation to assist with haircare. UB Dressing: Pt. Is independent donning a pullover shirt, and button down shirt. Has difficulty with buttoning, LB Dressing:  Independent donning pants, and socks. Difficulty tying shoes. Toileting: Independent Bathing: Pt. Is able to engage her right hand. Tub Shower transfers: Supervision Equipment: See above for equipment    IADLs: Shopping:  Has not had the opportunity for grocery shopping yet Light housekeeping: Husband is assisting with light house keeping Meal Prep:  Dependent Community mobility: Relies of family/friends Medication management: Husband assisting with weekly pillbox set-up, and administering medication. Financial management: TBD Handwriting: 75% legible   MOBILITY STATUS: Hx of falls   POSTURE COMMENTS:  No Significant postural limitations Sitting balance: supported sitting balance WFL   ACTIVITY TOLERANCE: Activity tolerance:  Fatigues in greater than 30 min.    FUNCTIONAL OUTCOME MEASURES: FOTO: 57   UPPER  EXTREMITY ROM      Active ROM Right Eval: WFL Left eval Left  01/22/23 Left  03/03/23 Left  04/14/23 Left  05/28/2023 Left 07/23/2023 Left 08/20/2023 Left 09/17/23 Left 10/29/23  Shoulder flexion   132 100 108 108 108 60(70) 70(78) 70(102) 82(110)  Shoulder abduction   80 85 85 85 85 65(78) 72(85) 73(94) 75(98)  Shoulder adduction              Shoulder extension              Shoulder internal rotation              Shoulder external rotation              Elbow flexion   140 140 WFL [facility] WFL  Elbow extension   WNL WNL [facility] WFL WFL  Wrist flexion   65 68         Wrist extension   -10 20 24  32 32 -10 20 20 26   Wrist ulnar deviation     12 10 14 14 10 18 18 20   Wrist radial deviation     8 14 14 16 12 14 14 14   Wrist pronation              Wrist supination              (Blank rows = not tested)   Left digit flexion to [facility]: 2nd: 0cm, 3rd: 0cm, 4th: 0cm, 5th: 0cm   Limited Left full 2nd digit extension     UPPER EXTREMITY MMT:      MMT Right Eval: 4+/5 overall Right 07/23/2023 Left Eval Left 01/22/23 Left 03/03/23 Left  04/14/2023 Left 05/28/2023 Left 07/23/2023 Left 08/20/2023 Left 09/17/23 Left  10/29/23  Shoulder flexion   3+/5 3/5 3-/5 3-/5 3-/5 3-/5 2/5 2+/5 2+/5 3-/5  Shoulder abduction   3+/5 3-/5 3-/5 3-/5 3-/5 3-/5 2/5 2+/5 2+/5 3-/5  Shoulder adduction               Shoulder extension               Shoulder internal rotation               Shoulder external rotation               Middle trapezius               Lower trapezius  Elbow flexion   4/5 3+/5 4/5 N/A 4/5 4/5 3+/5 3+/5 3+/5 3+/5  Elbow extension   4/5 3+/5 4/4 N/A 4/5 4/5 3+/5 3+/5 4-/5 4-/5  Wrist flexion               Wrist extension   4/5 2-/5 3-/5 3-/5 3/5 3+/5 3-/5 3+/5 3+/5 3+/5  Wrist ulnar deviation               Wrist radial deviation               Wrist pronation               Wrist supination               (Blank rows = not tested)   HAND  FUNCTION: Grip strength: Right: 26#, Left: 10# Pinch strength: Right 8#, Left: 3#, 3 Pt. Pinch strength: Right: 9#, L: 2#  01/22/2023 Grip strength: Right: 26#, Left: 12# Pinch strength:  Pinch meter used at the initial eval has been sent out for recalibration   03/03/2023 Grip strength: Right: 26#, Left: 13# Pinch strength:  Pinch meter used at the initial eval has been sent out for recalibration  04/14/2023: Grip strength: Right: 26#, Left: 13# Pinch strength:   Right 8#, Left: 4#, 3 Pt. Pinch strength: Right: 9#, L: 3#    05/28/2023: Grip strength: Right: 26#, Left: 13# Pinch strength:   Right 8#, Left: 2#, 3 Pt. Pinch strength: Right: 9#, L: 4#  07/23/2023  Grip strength: Right: 5#, Left: 2# Pinch strength:   Right 6#, Left: 2#, 3 Pt. Pinch strength: Right: 5#, L: 2#  08/20/2023:    Grip strength: Right: 8#, Left: 4# Pinch strength:   Right 6#, Left: 2#, 3 Pt. Pinch strength: Right: 5#, L: 2#    09/17/23:  Grip strength: Right: 5#, Left: 4# Pinch strength:   Right 6#, Left: 2#, 3 Pt. Pinch strength: Right: 5#, L: 2#  10/28/2024  Grip strength: Right: 13#, Left: 6# Pinch strength:   Right 6#, Left: 2#, 3 Pt. Pinch strength: Right: 5#, L: 2#     COORDINATION: Right: 22 sec., Left: <5 min. To place 7 pegs with increased compensation proximally in the trunk, and through reflexive associated reactions.  01/22/23 Right: 22 sec., Left: 3 min. & 4 sec.  03/03/23 Right: 22 sec., Left: 1 min. & 39 sec.  04/14/23   TBD  05/28/23 Right: 22 sec., Left:  5 min.  07/23/2023  Right: 33 sec., Left:  Pt. Is unable to grasp, and place pegs into the pegboard. Pt. Was able to remove 9 vertical pegs in 34 sec.  08/20/2023:  Right: 27 sec., Left:  5 pegs placed in 5 min.; Pt. Was able to remove 9 vertical pegs in 28 sec.  09/17/23:  Right: 26 sec., Left:  3 min, & 49 sec.    10/28/2024:  Right: 26 sec., Left:  2 min, & 10 sec.        SENSATION: Light touch: WFL,  proprioceptive awareness: Intact   EDEMA: N/A   MUSCLE TONE: LUE: Hypotonic   COGNITION: Overall cognitive status: WFL for tasks assessed. Pt. Is impulsive at times.   VISION: Subjective report: Pt. report having shingles affecting left eye  s/p 1 year. Has nerve pain Baseline vision: Wears glasses for reading only Visual history:  updated see clinical impression   VISION ASSESSMENT:    WFL for tasks performed   PERCEPTION: Intact   PRAXIS: Impaired: Motor planning  OBSERVATIONS:  Pt. more alert, and engaging since prior to the most recent hospitalization.    TODAY'S TREATMENT:    There. Ex.:   Pt. tolerated PROM/AAROM to the Left shoulder, elbow, wrist, and digits.    Neuromuscular re-education:   Pt. worked on left hand [facility] skills grasping 1" sticks from a horizontal position in a shallow dish, and moving them to a vertical position. Pt. worked on storing the objects in her palm, and  worked on Comptroller moving the items from the palm of the hand to the tip of the 2nd digit, and thumb. Pt. worked on removing the pegs using unilateral alternating hand patterns with the left hand.                   PATIENT EDUCATION: Education details: LUE therapeutic exercise Person educated: Patient and Spouse Education method: Medical illustrator Education comprehension: verbalized understanding, returned demonstration, and needs further education   HOME EXERCISE PROGRAM:   Reviewed activities at home to promote isolated 2nd digit extension.    GOALS: Goals reviewed with patient? Yes   SHORT TERM GOALS: Target date: 12/10/2023   1. Patient will be independent with home exercise program for the left upper extremity Baseline: 10/29/2023: Independent 09/17/23: Independent 08/20/23: Pt. continues to require assist from he husband for HEPs 07/23/2023: Pt. continues to require assist. 05/28/2023: Pt. Requires assist 04/09/2023: Pt. Continues to consistently  attempt to engage her hand at hand 02/01/2023: Pt. Consistently attempts to perform HEPs independently. No current home exercise program Goal status: Ongoing   LONG TERM GOALS: Target date:  01/21/2024  1.  Patient will improve left shoulder strength by 2 mm grades to be able to sustain UE's in elevation long enough to wash her hair.  Baseline: 10/29/2023: Left shoulder flexion 3-/5, abduction: 3-/5 10/27/2023:  Pt. Is improving with reaching up to reach her hair for hair care. Pt. Continues to have difficulty sustaining the UE in elevation long enough to perform hair care. 09/17/2023: Left shoulder 2+/5, abduction 2+/5 08/20/2023: Left shoulder flexion 2+/5, abduction 2+/5  07/23/2023: Left shoulder flexion: 2/5, abduction: 2/5. 05/28/2023: Left shoulder flexion: 3-/5, abduction: 3-/5. Pt. has an old left shoulder injury limiting progression with left shoulder strength 05/26/2023: Pt. continues to present with limitiations in sustaining LUE elevation long enough to perform hair care thoroughly 04/09/2023: Pt. is limited with sustaining LUE elevation long enough to perform hair care thoroughly. 03/03/2023: Left shoulder flexion: 3-/5, abduction: 3-/5 01/22/2023: Left shoulder flexion: 3-/5, abduction: 3-/5 Eval: Left shoulder flexion: 3/5, abduction: 3-/5 Goal status: Ongoing   2.  Patient will improve left shoulder active abduction to be able to comb her hair Baseline: 10/29/2023: Left shoulder abduction: 75(98) 10/27/2023: Pt. Is improving with reaching the left side of her hair for haircare. Pt. Has difficulty reaching with the left side to the back, and right side of her head.09/17/2023: 73(94) Pt. is able to reach her left side, and back of hair. 9/11/09/2023: 72(85) Pt. is able to reach her left side, and back of hair. 07/23/2023: Left shoulder flexion: 60(70) abduction: 65(78) 05/28/2023: left shoulder flexion: 108 abduction: 85 Pt. Has an old left shoulder injury limiting progression with left shoulder ROM  05/26/2023:  5/10 left shoulder pain with ROM limits using it functionally during hair care. 04/09/2023: Pt. Presents with difficulty abducting her left shoulder enough to thoroughly complete haircare. 03/03/2023: Shoulder abduction: 85 01/22/2023: Shoulder abduction: 85 Eval: Left shoulder abduction is 80(108) Goal status: Ongoing   3.  Patient will independently button her shirt with modified independence. Baseline:10/29/2023: Pt. Has difficulty manipulating buttons, will benefit from a buttonhook.10/27/23: Pt. Requires increased time to complete buttons.09/17/2023: Pt. Has a buttonhook, however does not know where it is. Pt. requires increased time to complete, and assist from her husband. 08/20/2023: Pt. requires increased time to complete, and assist from her husband. 07/23/2023 Pt. presents with difficulty buttoning her shirt. 05/28/2023: pt. Continues to work towards progressing with buttoning 05/26/2023:  Pt. Continues to progress towards buttoning. 04/09/2023: Pt. Continues to progress towards buttoning. 03/03/2023: Pt. Continues to have difficulty with buttoning. 01/22/2023: Pt. continues to have difficulty. Eval: Patient has difficulty.  Goal status: Ongoing   4.  Patient well improve left grip strength in preparatio for securely hold a glass/beverage Baseline: 10/29/2023: Grip strength: Right: 13#  Left: 6#10/27/2023: Pt. Has difficulty securely holding heavier items with larger diameter. 09/17/2023: Grip strength: Right: 5#, Left: 4# Pt. Has difficulty holding a glass. Right: 8#, Left: 4# Pt. Is able to more securely hold mirrors, and flowers with the left hand, however has difficulty holding a glass beverage, or bottle. 08/20/2023: Right: 8#, Left: 4# Pt. Is able to more securely hold mirrors, and flowers with the left hand, however has difficulty holding a glass beverage, or bottle. 07/23/2023: Grip strength: Right: 5#, Left: 2# 05/28/2023: Right: 26#, Left: 13#  pt. Presents with difficulty securely  holding flowers in her left hand. 05/26/2023: TBD 04/09/2023: Pt. Is able to hold, and hike pants with the left hand, continues to have difficulty with securely holding flowers.   03/03/2023: left grip strength: 13# 01/22/2023: Left: 12# Eval: Pt. Is unable to securely hold flowers. Goal status: Ongoing   5.  Pt. will independently recall adaptive  strategies for performing ADL/ADL kitchen tasks.  Baseline:10/29/2023: Continue 11/19/204: Continue 08/20/2023: Revised to include IADL kitchen tasks.  07/23/2023: Continue 05/28/2023: Pt. Needs continued education about adaptive strategies.05/26/2023: Continue 04/09/2023: Continue3/26/2024: Continue 01/22/2023: Pt. continues to benefit from education about adaptive strategies during ADLs, and IADLs. Eval: Pt. to be provided with adaptive strategies. Goal status: Revised   6.  Pt. will improve FOTO score by 2 points to reflect patient perceived performance improvement assessment specific ADLs  and IADLs Baseline: 95/28/4132: 66 10/27/2023: TBD 09/17/2023: FOTO score: 61 07/23/2023: FOTO score: 57 05/28/2023: FOTO score 59 05/26/2023: TBD 5/02/204:  TBD 03/03/23: FOTO 62 Eval: 57 Goal status: Ongoing  7.  Pt. will improve left hand coordination skills in order to be able to  manipulate small objects.     Baseline: 10/29/2023 Left: 2 min. & 10 sec. 10/27/2023: Pt. Is improving with manipulating small objects.09/17/2023: Right: 26 sec., Left:  3 min, & 49 sec. pegs placed in 5 min.; Pt. Was able to remove 9 vertical pegs in 24 sec. 08/20/2023: Right: 27 sec., Left:  5 pegs placed in 5 min.; Pt. Was able to remove 9 vertical pegs in 28 sec. Pt. Is able to hold, and sort utensils in a drawer. Pt. Has difficulty manipulating small objects. 07/23/2023: Right: 33 sec., Left:  Pt. Is unable to grasp, and place pegs into the pegboard. Pt. Was able to remove 9 vertical pegs in 34 sec. 05/28/2023: Left:  5 min. 05/26/2023 Pt. Continues to present with difficulty handling and sorting  utensils. 04/14/2023: 56 04/09/2023: TBD 03/03/23: 1 min. & 39 01/22/2022: Left: 3 min. & 4 sec. Eval: Pt. has difficulty sorting, and placing utensils with the left hand. Left FMC : >5 min. For 7 pegs on the 9  hole peg test.    Goal Status: Ongoing  8. Pt. will improve active left 2nd digit extension to be able able to isolate her 2nd digit in preparation for pressing/pushing buttons on appliances, phones, or remotes. Baseline: 10/29/2023: Pt. Is improving with 2nd digit isolation, to however continues to present with limited prolonged 2nd digit extension11/19/2024: Pt. Is improving with 2nd digit isolation, to however presents with limited prolonged 2nd digit extension. 09/17/2023: Pt. Presents with limited isolated 2nd digit extension with increased time. 08/20/2023: Pt. Is more consistently able to isolate 2nd digit extension with increased time. 07/23/2023: Pt. Continues to work on consistency with isolating left 2nd digit extension 05/28/2023: Pt. Continues to improve with left 2nd digit extension 05/26/2023: Improving with left 2nd digit extension. 04/09/2023: Pt. Is progressing with isolating left 2nd digit extension, however continues to present with limited increased flexor tone. 03/03/23: Pt. Continues to work on improving consistency with 2nd digit extension to press the remote. 01/22/2023: is able to perform full digit extension, although 2nd digit is slow to extend s 2/2 flexor tone. Pt.  Eval: Pt. is able to is unable to actively perform full digit extension Goal status: Ongoing  9: Pt. Will cut food with modified independence Baseline: 10/29/2023: : Pt. Continues to have difficulty cutting food on her plate.10/27/2023: Pt. Continues to have difficulty cutting food on her plate. 09/17/2023: Pt. Is unable to cut the food on her plate, as well as cut food for meal preparation. 08/20/2023: Pt. Is unable to cut the food on her plate, as well as cut food for meal preparation.    Goal Status: Ongoing    ASSESSMENT:   CLINICAL IMPRESSION:  Pt. reports no pain today, however the UBE was not performed today to rest the RUE 2/2 recent injury/pain. Pt. presents with difficulty performing translatory movements with the left hand, however Pt. was able to grasp the 1" sticks from the horizontal position,a nd place them vertically onto the pegboard. Pt. continues to work on improving LUE ROM, strength, FMC, and hand function skills in order to work towards improving, and maximizing independence with ADLs, and IADL tasks.   PERFORMANCE DEFICITS in functional skills including ADLs, IADLs, coordination, proprioception, ROM, strength, FMC, and GMC, cognitive skills including memory, and psychosocial skills including coping strategies, environmental adaptation, interpersonal interactions, and routines and behaviors.    IMPAIRMENTS are limiting patient from ADLs, IADLs, education, leisure, and social participation.    COMORBIDITIES may have co-morbidities  that affects occupational performance. Patient will benefit from skilled OT to address above impairments and improve overall function.   MODIFICATION OR ASSISTANCE TO COMPLETE EVALUATION: Min-Moderate modification of tasks or assist with assess necessary to complete an evaluation.   OT OCCUPATIONAL PROFILE AND HISTORY: Detailed assessment: Review of records and additional review of physical, cognitive, psychosocial history related to current functional performance.   CLINICAL DECISION MAKING: Moderate - several treatment options, min-mod task modification necessary   REHAB POTENTIAL: Good   EVALUATION COMPLEXITY: Moderate      PLAN: OT FREQUENCY: 2x/week   OT DURATION: 12 weeks   PLANNED INTERVENTIONS: self care/ADL training, therapeutic exercise, therapeutic activity, neuromuscular re-education, manual therapy, passive range of motion, functional mobility training, electrical stimulation, and paraffin   RECOMMENDED OTHER SERVICES: PT    CONSULTED AND AGREED WITH PLAN OF CARE: Patient and family member/caregiver   PLAN FOR NEXT SESSION: see above   Olegario Messier, MS, OTR/L  11/19/23, 11:12 PM

## 2023-11-24 ENCOUNTER — Ambulatory Visit: Payer: Medicare HMO | Admitting: Physical Therapy

## 2023-11-24 ENCOUNTER — Ambulatory Visit: Payer: Medicare HMO | Admitting: Occupational Therapy

## 2023-11-24 DIAGNOSIS — R531 Weakness: Secondary | ICD-10-CM | POA: Diagnosis not present

## 2023-11-24 DIAGNOSIS — M6281 Muscle weakness (generalized): Secondary | ICD-10-CM | POA: Diagnosis not present

## 2023-11-24 DIAGNOSIS — R2681 Unsteadiness on feet: Secondary | ICD-10-CM

## 2023-11-24 DIAGNOSIS — R278 Other lack of coordination: Secondary | ICD-10-CM

## 2023-11-24 DIAGNOSIS — R262 Difficulty in walking, not elsewhere classified: Secondary | ICD-10-CM

## 2023-11-24 NOTE — Therapy (Unsigned)
OUTPATIENT PHYSICAL THERAPY TREATMENT    Patient Name: [name] MRN: [id number] DOB:12-01-1941, 82 y.o., female Today's Date: 11/25/2023   PCP: Aram Beecham, D MD REFERRING PROVIDER: Jacquelynn Cree PA   END OF SESSION:  PT End of Session - 11/24/23 1624     Visit Number 21    Number of Visits 31    Date for PT Re-Evaluation 12/24/23    Authorization Type [employer]:    Authorization Time Period approved for 24 additional tx from 09/28/23 to 12/24/23; total visit count 31    Progress Note Due on Visit 30    PT Start Time 1615    PT Stop Time 1655    PT Time Calculation (min) 40 min    Equipment Utilized During Treatment Gait belt    Activity Tolerance Patient tolerated treatment well;No increased pain    Behavior During Therapy WFL for tasks assessed/performed                 Past Medical History:  Diagnosis Date   Anemia    Arthritis    Asthma    uses inhaler just prior to surgery to avoid attack   Back pain    from previous injury   Complication of anesthesia    has woken  up during 2 different surgery   Depression    no current issue/treatment; situation   Gallstones    GERD (gastroesophageal reflux disease)    Hiatal hernia    patient does NOT have nerve/muscle disease   History of kidney stones    HLD (hyperlipidemia)    HTN (hypertension)    Hypothyroidism    Kidney stones    Knee pain    Nausea and vomiting 10/15/2022   Non-diabetic pancreatic hormone dysfunction years   pt. 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   Pancreatitis    Pneumonia    Seizures (HCC)    caused by dye injected during a procedure   Shortness of breath    with exertion   Sinus problem    frequent infections/congestion   Stroke (HCC) 2021   reports having CVA in 2021 and having mini strokes before that   Thyroid disease    Past Surgical History:  Procedure Laterality Date   ABDOMINAL HYSTERECTOMY     APPENDECTOMY     CARPAL TUNNEL RELEASE  10+  years ago   bilateral   EYE SURGERY  3 yrs ago   bilateral cataracts   FOOT OSTEOTOMY  6 weeks ago   Left foot: great, 2nd & 3rd   FOOT OSTEOTOMY  5 years ago   Right great toe   HAND SURGERY Bilateral 2011-most recent   multiple hand surgeries, 2 on left, 3 on right   KNEE ARTHROPLASTY Right 04/28/2022   Procedure: COMPUTER ASSISTED TOTAL KNEE ARTHROPLASTY;  Surgeon: Donato Heinz, MD;  Location: [facility];  Service: Orthopedics;  Laterality: Right;   LOOP RECORDER INSERTION N/A 05/16/2020   Procedure: LOOP RECORDER INSERTION;  Surgeon: Marcina Millard, MD;  Location: [facility];  Service: Cardiovascular;  Laterality: N/A;   NASAL SINUS SURGERY  most recent 7-8 yrs ago   7 sinus surgeries    TRIGGER FINGER RELEASE  11/19/2011   Procedure: RELEASE TRIGGER FINGER/A-1 PULLEY;  Surgeon: Nicki Reaper, MD;  Location: [facility];  Service: Orthopedics;  Laterality: Right;  release a-1 pulley right index finger and cyst removal   WRIST GANGLION EXCISION  1980's   right  ONSET DATE: 11/23/22  REFERRING DIAG: TIA  THERAPY DIAG:  Muscle weakness (generalized)  Unsteadiness on feet  Difficulty in walking, not elsewhere classified  Other lack of coordination  Rationale for Evaluation and Treatment: Rehabilitation  SUBJECTIVE:                                                                                                                                                                                             SUBJECTIVE STATEMENT:   Pt reports she is doing well. No knee pain. Mild fatigue from not sleeping last night.  Pt accompanied by: significant other  PERTINENT HISTORY: [name] is an 10 year-old female who was admitted to [facility] on 11/23/2022 with a cerebellar CVA. 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 Pt. Was admitted to Inpatient Rehab from 12/21-1/02/2023. 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  Patient underwent inpatient rehabilitation for 2 weeks.  Patient was assessed and was scheduled for a knee replacement on 08/29/2022 however had to cancel it due to having had a CVA. PMH includes: anemia, arthritis, back pain, depression, hernia, GERD, HLD, HTN, hypothyroidism, knee pain, pancreatic hormone dysfunction, pneumonia, seizures, stroke, thyroid disease.  Patient has a donjoy brace for L knee.   PAIN:  Are you having pain? no  PRECAUTIONS: Fall, has latex allergy  WEIGHT BEARING RESTRICTIONS: No  FALLS: Has patient fallen in last 6 months? Yes. Number of falls 4 falls   LIVING ENVIRONMENT: Lives with: lives with their family and lives with their spouse Lives in: House/apartment Stairs: Yes: Internal: flight steps;   and External: 2 steps; on right going up and on left going up Has following equipment at home: Single point cane, Walker - 2 wheeled, Shower bench, and bed side commode  PLOF: Independent  PATIENT GOALS: to have less pain and move better  OBJECTIVE:   TODAY'S TREATMENT: DATE: 11/25/23   TherEx:  Seated LAQ with 3# AW donned, 2x10 each LE Seated marches with 3# AW donned, 2x10 each LE Seated hip adduction into rainbow physioball, 2x10  STS 2x5 for improved LE strengthening Weighted gait training with RW and 3# AW x 41ft with cues for step height on the RLE>   Gait training.   Gait around the gym with supervision and CGA at times with minimal cuing for safety and navigation in gym 2 x 42ft.  Forward/reverse gait with RW 48ft x 4 with min assist and cues for symmetry in step length.  Side stepping R and L 2 x 9ft with RW.   Pt noted to have increased SOB. Requiring  increased rest breaks following gait training.   PATIENT EDUCATION: Education details: Pt educated throughout session about proper posture and technique with exercises. Improved exercise technique, movement at target joints, use of target  muscles after min to mod verbal, visual, tactile cues.   HOME EXERCISE PROGRAM:  Access Code: [email or web address] URL: [email or web address] Date: 10/07/2023 Prepared by: Grier Rocher  Exercises - Standing March with Counter Support  - 1 x daily - 3 x weekly - 2 sets - 10 reps - Standing Hip Abduction with Unilateral Counter Support  - 1 x daily - 3 x weekly - 2 sets - 10 reps - 2 hold - Standing Hip Extension with Unilateral Counter Support  - 1 x daily - 3 x weekly - 2 sets - 10 reps - 2 hold - Supine Straight Leg Raises  - 1 x daily - 37 x weekly - 2 sets - 10 reps - Bridge  - 1 x daily - 3 x weekly - 2 sets - 10 reps - 2 hold - Supine Heel Slide  - 1 x daily - 3 x weekly - 2 sets - 10 reps  GOALS: Goals reviewed with patient? Yes  SHORT TERM GOALS: Target date: 09/19/23  Patient will be independent in starter HEP to improve A/ROM tolerance and preserve available ROM of LLE and to advance A/ROM and strength of RLE.  Baseline: 08/20/23: defer to vists 2-3  Goal status: NEW  LONG TERM GOALS: Target date: 10/20/23  Patient will increase FOTO score to equal to or greater than 65%  to demonstrate statistically significant improvement in ease of mobility.  Baseline:  47%; 06/23/23: 60;  7/16: 60%  10/31: 54 11/19/23: 56 Goal status: PROGRESSING   2.  Patient (> 72 years old) will complete five times sit to stand test in < 15 seconds indicating an increased RLE power.  Baseline: 5/7: unable to tolerate 7/16: 17.4 sec 10/31: 11.02 sec 11/19/23: 13.06 sec Goal status: PROGRESSING   3.   Patient to report tolerance of ad lib AMB in home during the day of distances >36ft >5x at supervision level or better to improve independence with ADL.    Baseline: 08/20/23: using transport chair for nearly all mobility, not tolerating walking due to WB limitations of LLE  10/31: able to ambulate 30-77ft with CGA from PT.  11/19/23: Pt able to ambulate 255.3' with CGA, FWW, and chair  follow Goal status: PROGRESSING      ASSESSMENT:  CLINICAL IMPRESSION:    PT treatment continued to focus on BLE strengthening and independence with gait. PT tolerated increased weight  for therex with cues for full ROM and decreased speed occasionally. Mild increased in SOB on this day requiring rest breaks following gait training with and without resistance. Pt will continue to benefit from skilled therapy to address remaining deficits in order to improve overall QoL and return to PLOF.     OBJECTIVE IMPAIRMENTS: Abnormal gait, cardiopulmonary status limiting activity, decreased activity tolerance, decreased balance, decreased cognition, decreased endurance, decreased knowledge of use of DME, decreased mobility, difficulty walking, decreased ROM, decreased strength, decreased safety awareness, hypomobility, increased fascial restrictions, impaired perceived functional ability, impaired flexibility, impaired UE functional use, improper body mechanics, postural dysfunction, and pain.   ACTIVITY LIMITATIONS: carrying, lifting, bending, sitting, standing, squatting, stairs, transfers, bed mobility, bathing, toileting, dressing, reach over head, hygiene/grooming, locomotion level, and caring for others  PARTICIPATION LIMITATIONS: meal prep, cleaning, laundry, medication management, personal finances, interpersonal relationship, driving, shopping, community activity, yard work,  school, and church  PERSONAL FACTORS: Age, Behavior pattern, Education, Fitness, Past/current experiences, Time since onset of injury/illness/exacerbation, and 3+ comorbidities: anemia, arthritis, back pain, depression, hernia, GERD, HLD, HTN, hypothyroidism, knee pain, pancreatic hormone dysfunction, pneumonia, seizures, stroke, thyroid disease  are also affecting patient's functional outcome.   REHAB POTENTIAL: Fair    CLINICAL DECISION MAKING: Evolving/moderate complexity  EVALUATION COMPLEXITY: Moderate  PLAN:  PT  FREQUENCY: 1-2x/week  PT DURATION: 10 weeks  PLANNED INTERVENTIONS: Therapeutic exercises, Therapeutic activity, Neuromuscular re-education, Balance training, Gait training, Patient/Family education, Self Care, Joint mobilization, Joint manipulation, Stair training, Vestibular training, Canalith repositioning, Visual/preceptual remediation/compensation, Orthotic/Fit training, DME instructions, Electrical stimulation, Wheelchair mobility training, Spinal mobilization, Cryotherapy, Moist heat, Compression bandaging, scar mobilization, Splintting, Taping, Traction, Ultrasound, Manual therapy, and Re-evaluation   PLAN FOR NEXT SESSION:  Tolerance to standing in functional movement/ tasks.  Continue BLE strengthening in weighted and unweighted position   Grier Rocher PT, DPT  Physical Therapist - [facility]  [facility]  9:49 AM 11/25/23

## 2023-11-25 NOTE — Therapy (Signed)
Occupational Therapy Neuro Treatment Note    Patient Name: [name] MRN: [id number] DOB:June 26, 1941, 82 y.o., female Today's Date: 11/25/2023  PCP: Dr. Judithann Sheen REFERRING PROVIDER:  Dr. Judithann Sheen  END OF SESSION:  PLAN:   OT End of Session - 11/25/23 0822     Visit Number [id number]    Number of Visits 96    Date for OT Re-Evaluation 01/21/24    Authorization Time Period Progress report period starting 12/16/2022    OT Start Time 1530    OT Stop Time 1615    OT Time Calculation (min) 45 min    Activity Tolerance Patient tolerated treatment well    Behavior During Therapy WFL for tasks assessed/performed                   Past Medical History:  Diagnosis Date   Anemia    Arthritis    Asthma    uses inhaler just prior to surgery to avoid attack   Back pain    from previous injury   Complication of anesthesia    has woken  up during 2 different surgery   Depression    no current issue/treatment; situation   Gallstones    GERD (gastroesophageal reflux disease)    Hiatal hernia    patient does NOT have nerve/muscle disease   History of kidney stones    HLD (hyperlipidemia)    HTN (hypertension)    Hypothyroidism    Kidney stones    Knee pain    Nausea and vomiting 10/15/2022   Non-diabetic pancreatic hormone dysfunction years   pt. 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   Pancreatitis    Pneumonia    Seizures (HCC)    caused by dye injected during a procedure   Shortness of breath    with exertion   Sinus problem    frequent infections/congestion   Stroke (HCC) 2021   reports having CVA in 2021 and having mini strokes before that   Thyroid disease    Past Surgical History:  Procedure Laterality Date   ABDOMINAL HYSTERECTOMY     APPENDECTOMY     CARPAL TUNNEL RELEASE  10+ years ago   bilateral   EYE SURGERY  3 yrs ago   bilateral cataracts   FOOT OSTEOTOMY  6 weeks ago   Left foot: great, 2nd & 3rd   FOOT OSTEOTOMY  5 years ago   Right great toe    HAND SURGERY Bilateral 2011-most recent   multiple hand surgeries, 2 on left, 3 on right   KNEE ARTHROPLASTY Right 04/28/2022   Procedure: COMPUTER ASSISTED TOTAL KNEE ARTHROPLASTY;  Surgeon: Donato Heinz, MD;  Location: [facility];  Service: Orthopedics;  Laterality: Right;   LOOP RECORDER INSERTION N/A 05/16/2020   Procedure: LOOP RECORDER INSERTION;  Surgeon: Marcina Millard, MD;  Location: [facility];  Service: Cardiovascular;  Laterality: N/A;   NASAL SINUS SURGERY  most recent 7-8 yrs ago   7 sinus surgeries    TRIGGER FINGER RELEASE  11/19/2011   Procedure: RELEASE TRIGGER FINGER/A-1 PULLEY;  Surgeon: Nicki Reaper, MD;  Location: [facility];  Service: Orthopedics;  Laterality: Right;  release a-1 pulley right index finger and cyst removal   WRIST GANGLION EXCISION  1980's   right   Patient Active Problem List   Diagnosis Date Noted   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 11/27/2022   Hypomagnesemia 11/27/2022   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  HLD (hyperlipidemia) 11/23/2022   Asthma 11/23/2022   Depression with anxiety 11/23/2022   Chronic diastolic CHF (congestive heart failure) (HCC) 11/23/2022   Normocytic anemia 11/23/2022   Aspiration pneumonia (HCC) 11/23/2022   AKI (acute kidney injury) (HCC) 11/23/2022   Abdominal pain 11/23/2022   Mesenteric mass 11/23/2022   Coffee ground emesis 11/23/2022   Nausea and vomiting 10/15/2022   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   Fatigue 09/17/2022   Left hemiparesis (HCC) 09/17/2022   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HCC) 08/22/2022   GERD (gastroesophageal reflux disease) 08/21/2022   Agitation 08/20/2022   Acute left-sided weakness 08/20/2022   Expressive aphasia    Stroke (HCC) 08/19/2022   Leukocytosis 08/19/2022   History of urticaria 04/28/2022   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   Primary osteoarthritis of left knee 02/24/2022   Primary osteoarthritis of right  knee 02/24/2022   Lumbar spondylolysis 04/12/2020   History of CVA (cerebrovascular accident) 03/26/2020   Low back pain radiating to right lower extremity 03/21/2020   B12 deficiency 03/06/2020   Positive anti-CCP test 12/21/2019   Arthralgia 12/13/2019   Dermatitis 12/13/2019   Rheumatoid factor positive 12/13/2019   Essential hypertension 12/11/2018   Palpitations 12/11/2018   Acquired hypothyroidism 11/10/2018   Arthritis of knee 09/17/2016   Anxiety 11/22/2014   Asthma without status asthmaticus 11/22/2014   Benign neoplasm of colon, unspecified 11/22/2014   Environmental allergies 11/22/2014   Hypertriglyceridemia 11/22/2014   Hypokalemia 11/22/2014   Personal history of disease of skin and subcutaneous tissue 11/22/2014   REFERRING DIAG: CVA   THERAPY DIAG:  Muscle weakness (generalized)   Other lack of coordination   Rationale for Evaluation and Treatment Rehabilitation   SUBJECTIVE:   SUBJECTIVE STATEMENT:  Pt. Reports that her right harm hurt this week. Pt. Reports pulling herself through doorways, over thresholds while in her wheelchair at home.  Pt accompanied by: significant other   PERTINENT HISTORY: Patient is an 82 year-old female who was admitted to [facility] on 11/23/2022 with a cerebellar CVA. 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 Pt. Was admitted to Inpatient Rehab from 12/21-1/02/2023. 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  Patient underwent inpatient rehabilitation for 2 weeks.  Patient was assessed and was scheduled for a knee replacement on 08/29/2022 however had to cancel it due to having had a CVA.  Patient had a recent fall 2 days after discharging from inpatient rehab. Past Medical History includes: Knee replacement, essential HTN, hypokalemia, leukocytosis, seizures, positive anti-- CCP test, anxiety disorder, mini strokes.  Patient had shingles with left eye  nerve pain s/p 1 year ago.    PRECAUTIONS: Fall   WEIGHT BEARING RESTRICTIONS No   PAIN:  Are you having pain?  No pain.   FALLS: Has patient fallen in last 6 months? Yes. Number of falls 1   LIVING ENVIRONMENT: Lives with: Lives with Spouse Lives in: House/apartment Stairs: 2 storey home, resides on the first floor.  External: 2 stairs front no rails, and 6 in back with rails Has following equipment at home: Single point cane, Walker - 2 wheeled, Environmental consultant - 4 wheeled, Shower bench, and bed side commode   PLOF: Independent   PATIENT GOALS  To Regain the use of her left arm   OBJECTIVE:    HAND DOMINANCE: Right   ADLs: Overall ADLs: Husband assists pt. as needed Transfers/ambulation related to ADLs:Pt. Uses a 3 wheeled walker with Husband assist. Eating: Pt. Is independent with  the right hand. Pt. has difficulty cutting food. Grooming: Pt. is using her right hand, however has difficulty sustaining her LUE in elevation to assist with haircare. UB Dressing: Pt. Is independent donning a pullover shirt, and button down shirt. Has difficulty with buttoning, LB Dressing:  Independent donning pants, and socks. Difficulty tying shoes. Toileting: Independent Bathing: Pt. Is able to engage her right hand. Tub Shower transfers: Supervision Equipment: See above for equipment    IADLs: Shopping:  Has not had the opportunity for grocery shopping yet Light housekeeping: Husband is assisting with light house keeping Meal Prep:  Dependent Community mobility: Relies of family/friends Medication management: Husband assisting with weekly pillbox set-up, and administering medication. Financial management: TBD Handwriting: 75% legible   MOBILITY STATUS: Hx of falls   POSTURE COMMENTS:  No Significant postural limitations Sitting balance: supported sitting balance WFL   ACTIVITY TOLERANCE: Activity tolerance:  Fatigues in greater than 30 min.    FUNCTIONAL OUTCOME MEASURES: FOTO: 57    UPPER EXTREMITY ROM      Active ROM Right Eval: WFL Left eval Left  01/22/23 Left  03/03/23 Left  04/14/23 Left  05/28/2023 Left 07/23/2023 Left 08/20/2023 Left 09/17/23 Left 10/29/23  Shoulder flexion   132 100 108 108 108 60(70) 70(78) 70(102) 82(110)  Shoulder abduction   80 85 85 85 85 65(78) 72(85) 73(94) 75(98)  Shoulder adduction              Shoulder extension              Shoulder internal rotation              Shoulder external rotation              Elbow flexion   140 140 WFL [facility] WFL  Elbow extension   WNL WNL [facility] WFL [facility] WFL WFL  Wrist flexion   65 68         Wrist extension   -10 20 24  32 32 -10 20 20 26   Wrist ulnar deviation     12 10 14 14 10 18 18 20   Wrist radial deviation     8 14 14 16 12 14 14 14   Wrist pronation              Wrist supination              (Blank rows = not tested)   Left digit flexion to [facility]: 2nd: 0cm, 3rd: 0cm, 4th: 0cm, 5th: 0cm   Limited Left full 2nd digit extension     UPPER EXTREMITY MMT:      MMT Right Eval: 4+/5 overall Right 07/23/2023 Left Eval Left 01/22/23 Left 03/03/23 Left  04/14/2023 Left 05/28/2023 Left 07/23/2023 Left 08/20/2023 Left 09/17/23 Left  10/29/23  Shoulder flexion   3+/5 3/5 3-/5 3-/5 3-/5 3-/5 2/5 2+/5 2+/5 3-/5  Shoulder abduction   3+/5 3-/5 3-/5 3-/5 3-/5 3-/5 2/5 2+/5 2+/5 3-/5  Shoulder adduction               Shoulder extension               Shoulder internal rotation               Shoulder external rotation               Middle trapezius               Lower trapezius  Elbow flexion   4/5 3+/5 4/5 N/A 4/5 4/5 3+/5 3+/5 3+/5 3+/5  Elbow extension   4/5 3+/5 4/4 N/A 4/5 4/5 3+/5 3+/5 4-/5 4-/5  Wrist flexion               Wrist extension   4/5 2-/5 3-/5 3-/5 3/5 3+/5 3-/5 3+/5 3+/5 3+/5  Wrist ulnar deviation               Wrist radial deviation               Wrist pronation               Wrist supination               (Blank rows = not tested)   HAND  FUNCTION: Grip strength: Right: 26#, Left: 10# Pinch strength: Right 8#, Left: 3#, 3 Pt. Pinch strength: Right: 9#, L: 2#  01/22/2023 Grip strength: Right: 26#, Left: 12# Pinch strength:  Pinch meter used at the initial eval has been sent out for recalibration   03/03/2023 Grip strength: Right: 26#, Left: 13# Pinch strength:  Pinch meter used at the initial eval has been sent out for recalibration  04/14/2023: Grip strength: Right: 26#, Left: 13# Pinch strength:   Right 8#, Left: 4#, 3 Pt. Pinch strength: Right: 9#, L: 3#    05/28/2023: Grip strength: Right: 26#, Left: 13# Pinch strength:   Right 8#, Left: 2#, 3 Pt. Pinch strength: Right: 9#, L: 4#  07/23/2023  Grip strength: Right: 5#, Left: 2# Pinch strength:   Right 6#, Left: 2#, 3 Pt. Pinch strength: Right: 5#, L: 2#  08/20/2023:    Grip strength: Right: 8#, Left: 4# Pinch strength:   Right 6#, Left: 2#, 3 Pt. Pinch strength: Right: 5#, L: 2#    09/17/23:  Grip strength: Right: 5#, Left: 4# Pinch strength:   Right 6#, Left: 2#, 3 Pt. Pinch strength: Right: 5#, L: 2#  10/28/2024  Grip strength: Right: 13#, Left: 6# Pinch strength:   Right 6#, Left: 2#, 3 Pt. Pinch strength: Right: 5#, L: 2#     COORDINATION: Right: 22 sec., Left: <5 min. To place 7 pegs with increased compensation proximally in the trunk, and through reflexive associated reactions.  01/22/23 Right: 22 sec., Left: 3 min. & 4 sec.  03/03/23 Right: 22 sec., Left: 1 min. & 39 sec.  04/14/23   TBD  05/28/23 Right: 22 sec., Left:  5 min.  07/23/2023  Right: 33 sec., Left:  Pt. Is unable to grasp, and place pegs into the pegboard. Pt. Was able to remove 9 vertical pegs in 34 sec.  08/20/2023:  Right: 27 sec., Left:  5 pegs placed in 5 min.; Pt. Was able to remove 9 vertical pegs in 28 sec.  09/17/23:  Right: 26 sec., Left:  3 min, & 49 sec.    10/28/2024:  Right: 26 sec., Left:  2 min, & 10 sec.        SENSATION: Light touch: WFL,  proprioceptive awareness: Intact   EDEMA: N/A   MUSCLE TONE: LUE: Hypotonic   COGNITION: Overall cognitive status: WFL for tasks assessed. Pt. Is impulsive at times.   VISION: Subjective report: Pt. report having shingles affecting left eye  s/p 1 year. Has nerve pain Baseline vision: Wears glasses for reading only Visual history:  updated see clinical impression   VISION ASSESSMENT:    WFL for tasks performed   PERCEPTION: Intact   PRAXIS: Impaired: Motor planning  OBSERVATIONS:  Pt. more alert, and engaging since prior to the most recent hospitalization.    TODAY'S TREATMENT:    There. Ex.:   Pt. tolerated PROM/AAROM to the Left shoulder, elbow, wrist, and digits.    Neuromuscular re-education:   Pt. worked on left hand [facility] skills grasping 1" sticks from a horizontal position in a shallow dish, and moving them to a vertical position. Pt. worked on storing the objects in her palm, and  worked on Comptroller moving the items from the palm of the hand to the tip of the 2nd digit, and thumb. Pt. worked on removing the pegs using unilateral alternating hand patterns with the left hand.                   PATIENT EDUCATION: Education details: LUE therapeutic exercise Person educated: Patient and Spouse Education method: Medical illustrator Education comprehension: verbalized understanding, returned demonstration, and needs further education   HOME EXERCISE PROGRAM:   Reviewed activities at home to promote isolated 2nd digit extension.    GOALS: Goals reviewed with patient? Yes   SHORT TERM GOALS: Target date: 12/10/2023   1. Patient will be independent with home exercise program for the left upper extremity Baseline: 10/29/2023: Independent 09/17/23: Independent 08/20/23: Pt. continues to require assist from he husband for HEPs 07/23/2023: Pt. continues to require assist. 05/28/2023: Pt. Requires assist 04/09/2023: Pt. Continues to consistently  attempt to engage her hand at hand 02/01/2023: Pt. Consistently attempts to perform HEPs independently. No current home exercise program Goal status: Ongoing   LONG TERM GOALS: Target date:  01/21/2024  1.  Patient will improve left shoulder strength by 2 mm grades to be able to sustain UE's in elevation long enough to wash her hair.  Baseline: 10/29/2023: Left shoulder flexion 3-/5, abduction: 3-/5 10/27/2023:  Pt. Is improving with reaching up to reach her hair for hair care. Pt. Continues to have difficulty sustaining the UE in elevation long enough to perform hair care. 09/17/2023: Left shoulder 2+/5, abduction 2+/5 08/20/2023: Left shoulder flexion 2+/5, abduction 2+/5  07/23/2023: Left shoulder flexion: 2/5, abduction: 2/5. 05/28/2023: Left shoulder flexion: 3-/5, abduction: 3-/5. Pt. has an old left shoulder injury limiting progression with left shoulder strength 05/26/2023: Pt. continues to present with limitiations in sustaining LUE elevation long enough to perform hair care thoroughly 04/09/2023: Pt. is limited with sustaining LUE elevation long enough to perform hair care thoroughly. 03/03/2023: Left shoulder flexion: 3-/5, abduction: 3-/5 01/22/2023: Left shoulder flexion: 3-/5, abduction: 3-/5 Eval: Left shoulder flexion: 3/5, abduction: 3-/5 Goal status: Ongoing   2.  Patient will improve left shoulder active abduction to be able to comb her hair Baseline: 10/29/2023: Left shoulder abduction: 75(98) 10/27/2023: Pt. Is improving with reaching the left side of her hair for haircare. Pt. Has difficulty reaching with the left side to the back, and right side of her head.09/17/2023: 73(94) Pt. is able to reach her left side, and back of hair. 9/11/09/2023: 72(85) Pt. is able to reach her left side, and back of hair. 07/23/2023: Left shoulder flexion: 60(70) abduction: 65(78) 05/28/2023: left shoulder flexion: 108 abduction: 85 Pt. Has an old left shoulder injury limiting progression with left shoulder ROM  05/26/2023:  5/10 left shoulder pain with ROM limits using it functionally during hair care. 04/09/2023: Pt. Presents with difficulty abducting her left shoulder enough to thoroughly complete haircare. 03/03/2023: Shoulder abduction: 85 01/22/2023: Shoulder abduction: 85 Eval: Left shoulder abduction is 80(108) Goal status: Ongoing   3.  Patient will independently button her shirt with modified independence. Baseline:10/29/2023: Pt. Has difficulty manipulating buttons, will benefit from a buttonhook.10/27/23: Pt. Requires increased time to complete buttons.09/17/2023: Pt. Has a buttonhook, however does not know where it is. Pt. requires increased time to complete, and assist from her husband. 08/20/2023: Pt. requires increased time to complete, and assist from her husband. 07/23/2023 Pt. presents with difficulty buttoning her shirt. 05/28/2023: pt. Continues to work towards progressing with buttoning 05/26/2023:  Pt. Continues to progress towards buttoning. 04/09/2023: Pt. Continues to progress towards buttoning. 03/03/2023: Pt. Continues to have difficulty with buttoning. 01/22/2023: Pt. continues to have difficulty. Eval: Patient has difficulty.  Goal status: Ongoing   4.  Patient well improve left grip strength in preparatio for securely hold a glass/beverage Baseline: 10/29/2023: Grip strength: Right: 13#  Left: 6#10/27/2023: Pt. Has difficulty securely holding heavier items with larger diameter. 09/17/2023: Grip strength: Right: 5#, Left: 4# Pt. Has difficulty holding a glass. Right: 8#, Left: 4# Pt. Is able to more securely hold mirrors, and flowers with the left hand, however has difficulty holding a glass beverage, or bottle. 08/20/2023: Right: 8#, Left: 4# Pt. Is able to more securely hold mirrors, and flowers with the left hand, however has difficulty holding a glass beverage, or bottle. 07/23/2023: Grip strength: Right: 5#, Left: 2# 05/28/2023: Right: 26#, Left: 13#  pt. Presents with difficulty securely  holding flowers in her left hand. 05/26/2023: TBD 04/09/2023: Pt. Is able to hold, and hike pants with the left hand, continues to have difficulty with securely holding flowers.   03/03/2023: left grip strength: 13# 01/22/2023: Left: 12# Eval: Pt. Is unable to securely hold flowers. Goal status: Ongoing   5.  Pt. will independently recall adaptive  strategies for performing ADL/ADL kitchen tasks.  Baseline:10/29/2023: Continue 11/19/204: Continue 08/20/2023: Revised to include IADL kitchen tasks.  07/23/2023: Continue 05/28/2023: Pt. Needs continued education about adaptive strategies.05/26/2023: Continue 04/09/2023: Continue3/26/2024: Continue 01/22/2023: Pt. continues to benefit from education about adaptive strategies during ADLs, and IADLs. Eval: Pt. to be provided with adaptive strategies. Goal status: Revised   6.  Pt. will improve FOTO score by 2 points to reflect patient perceived performance improvement assessment specific ADLs  and IADLs Baseline: 16/09/9603: 66 10/27/2023: TBD 09/17/2023: FOTO score: 61 07/23/2023: FOTO score: 57 05/28/2023: FOTO score 59 05/26/2023: TBD 5/02/204:  TBD 03/03/23: FOTO 62 Eval: 57 Goal status: Ongoing  7.  Pt. will improve left hand coordination skills in order to be able to  manipulate small objects.     Baseline: 10/29/2023 Left: 2 min. & 10 sec. 10/27/2023: Pt. Is improving with manipulating small objects.09/17/2023: Right: 26 sec., Left:  3 min, & 49 sec. pegs placed in 5 min.; Pt. Was able to remove 9 vertical pegs in 24 sec. 08/20/2023: Right: 27 sec., Left:  5 pegs placed in 5 min.; Pt. Was able to remove 9 vertical pegs in 28 sec. Pt. Is able to hold, and sort utensils in a drawer. Pt. Has difficulty manipulating small objects. 07/23/2023: Right: 33 sec., Left:  Pt. Is unable to grasp, and place pegs into the pegboard. Pt. Was able to remove 9 vertical pegs in 34 sec. 05/28/2023: Left:  5 min. 05/26/2023 Pt. Continues to present with difficulty handling and sorting  utensils. 04/14/2023: 56 04/09/2023: TBD 03/03/23: 1 min. & 39 01/22/2022: Left: 3 min. & 4 sec. Eval: Pt. has difficulty sorting, and placing utensils with the left hand. Left FMC : >5 min. For 7 pegs on the 9  hole peg test.    Goal Status: Ongoing  8. Pt. will improve active left 2nd digit extension to be able able to isolate her 2nd digit in preparation for pressing/pushing buttons on appliances, phones, or remotes. Baseline: 10/29/2023: Pt. Is improving with 2nd digit isolation, to however continues to present with limited prolonged 2nd digit extension11/19/2024: Pt. Is improving with 2nd digit isolation, to however presents with limited prolonged 2nd digit extension. 09/17/2023: Pt. Presents with limited isolated 2nd digit extension with increased time. 08/20/2023: Pt. Is more consistently able to isolate 2nd digit extension with increased time. 07/23/2023: Pt. Continues to work on consistency with isolating left 2nd digit extension 05/28/2023: Pt. Continues to improve with left 2nd digit extension 05/26/2023: Improving with left 2nd digit extension. 04/09/2023: Pt. Is progressing with isolating left 2nd digit extension, however continues to present with limited increased flexor tone. 03/03/23: Pt. Continues to work on improving consistency with 2nd digit extension to press the remote. 01/22/2023: is able to perform full digit extension, although 2nd digit is slow to extend s 2/2 flexor tone. Pt.  Eval: Pt. is able to is unable to actively perform full digit extension Goal status: Ongoing  9: Pt. Will cut food with modified independence Baseline: 10/29/2023: : Pt. Continues to have difficulty cutting food on her plate.10/27/2023: Pt. Continues to have difficulty cutting food on her plate. 09/17/2023: Pt. Is unable to cut the food on her plate, as well as cut food for meal preparation. 08/20/2023: Pt. Is unable to cut the food on her plate, as well as cut food for meal preparation.    Goal Status: Ongoing    ASSESSMENT:   CLINICAL IMPRESSION:  Pt. reports no pain today, however the UBE was not performed today to rest the RUE 2/2 recent injury/pain. Pt. presents with difficulty performing translatory movements with the left hand, however Pt. was able to grasp the 1" sticks from the horizontal position,a nd place them vertically onto the pegboard. Pt. continues to work on improving LUE ROM, strength, FMC, and hand function skills in order to work towards improving, and maximizing independence with ADLs, and IADL tasks.   PERFORMANCE DEFICITS in functional skills including ADLs, IADLs, coordination, proprioception, ROM, strength, FMC, and GMC, cognitive skills including memory, and psychosocial skills including coping strategies, environmental adaptation, interpersonal interactions, and routines and behaviors.    IMPAIRMENTS are limiting patient from ADLs, IADLs, education, leisure, and social participation.    COMORBIDITIES may have co-morbidities  that affects occupational performance. Patient will benefit from skilled OT to address above impairments and improve overall function.   MODIFICATION OR ASSISTANCE TO COMPLETE EVALUATION: Min-Moderate modification of tasks or assist with assess necessary to complete an evaluation.   OT OCCUPATIONAL PROFILE AND HISTORY: Detailed assessment: Review of records and additional review of physical, cognitive, psychosocial history related to current functional performance.   CLINICAL DECISION MAKING: Moderate - several treatment options, min-mod task modification necessary   REHAB POTENTIAL: Good   EVALUATION COMPLEXITY: Moderate      PLAN: OT FREQUENCY: 2x/week   OT DURATION: 12 weeks   PLANNED INTERVENTIONS: self care/ADL training, therapeutic exercise, therapeutic activity, neuromuscular re-education, manual therapy, passive range of motion, functional mobility training, electrical stimulation, and paraffin   RECOMMENDED OTHER SERVICES: PT    CONSULTED AND AGREED WITH PLAN OF CARE: Patient and family member/caregiver   PLAN FOR NEXT SESSION: see above   Olegario Messier, MS, OTR/L  11/25/23, 8:25 AM         Occupational Therapy  Neuro Treatment Note    Patient Name: [name] MRN: [id number] DOB:1941/11/20, 82 y.o., female Today's Date: 11/25/2023  PCP: Dr. Judithann Sheen REFERRING PROVIDER:  Dr. Judithann Sheen  END OF SESSION:  PLAN:   OT End of Session - 11/25/23 0822     Visit Number [id number]    Number of Visits 96    Date for OT Re-Evaluation 01/21/24    Authorization Time Period Progress report period starting 12/16/2022    OT Start Time 1530    OT Stop Time 1615    OT Time Calculation (min) 45 min    Activity Tolerance Patient tolerated treatment well    Behavior During Therapy WFL for tasks assessed/performed                   Past Medical History:  Diagnosis Date   Anemia    Arthritis    Asthma    uses inhaler just prior to surgery to avoid attack   Back pain    from previous injury   Complication of anesthesia    has woken  up during 2 different surgery   Depression    no current issue/treatment; situation   Gallstones    GERD (gastroesophageal reflux disease)    Hiatal hernia    patient does NOT have nerve/muscle disease   History of kidney stones    HLD (hyperlipidemia)    HTN (hypertension)    Hypothyroidism    Kidney stones    Knee pain    Nausea and vomiting 10/15/2022   Non-diabetic pancreatic hormone dysfunction years   pt. 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   Pancreatitis    Pneumonia    Seizures (HCC)    caused by dye injected during a procedure   Shortness of breath    with exertion   Sinus problem    frequent infections/congestion   Stroke (HCC) 2021   reports having CVA in 2021 and having mini strokes before that   Thyroid disease    Past Surgical History:  Procedure Laterality Date   ABDOMINAL HYSTERECTOMY     APPENDECTOMY     CARPAL TUNNEL RELEASE  10+ years  ago   bilateral   EYE SURGERY  3 yrs ago   bilateral cataracts   FOOT OSTEOTOMY  6 weeks ago   Left foot: great, 2nd & 3rd   FOOT OSTEOTOMY  5 years ago   Right great toe   HAND SURGERY Bilateral 2011-most recent   multiple hand surgeries, 2 on left, 3 on right   KNEE ARTHROPLASTY Right 04/28/2022   Procedure: COMPUTER ASSISTED TOTAL KNEE ARTHROPLASTY;  Surgeon: Donato Heinz, MD;  Location: [facility];  Service: Orthopedics;  Laterality: Right;   LOOP RECORDER INSERTION N/A 05/16/2020   Procedure: LOOP RECORDER INSERTION;  Surgeon: Marcina Millard, MD;  Location: [facility];  Service: Cardiovascular;  Laterality: N/A;   NASAL SINUS SURGERY  most recent 7-8 yrs ago   7 sinus surgeries    TRIGGER FINGER RELEASE  11/19/2011   Procedure: RELEASE TRIGGER FINGER/A-1 PULLEY;  Surgeon: Nicki Reaper, MD;  Location: [facility];  Service: Orthopedics;  Laterality: Right;  release a-1 pulley right index finger and cyst removal   WRIST GANGLION EXCISION  1980's   right   Patient Active Problem List   Diagnosis Date Noted   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 11/27/2022   Hypomagnesemia 11/27/2022   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   HLD (hyperlipidemia)  11/23/2022   Asthma 11/23/2022   Depression with anxiety 11/23/2022   Chronic diastolic CHF (congestive heart failure) (HCC) 11/23/2022   Normocytic anemia 11/23/2022   Aspiration pneumonia (HCC) 11/23/2022   AKI (acute kidney injury) (HCC) 11/23/2022   Abdominal pain 11/23/2022   Mesenteric mass 11/23/2022   Coffee ground emesis 11/23/2022   Nausea and vomiting 10/15/2022   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   Fatigue 09/17/2022   Left hemiparesis (HCC) 09/17/2022   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HCC) 08/22/2022   GERD (gastroesophageal reflux disease) 08/21/2022   Agitation 08/20/2022   Acute left-sided weakness 08/20/2022   Expressive aphasia    Stroke (HCC) 08/19/2022    Leukocytosis 08/19/2022   History of urticaria 04/28/2022   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   Primary osteoarthritis of left knee 02/24/2022   Primary osteoarthritis of right knee 02/24/2022   Lumbar spondylolysis 04/12/2020   History of CVA (cerebrovascular accident) 03/26/2020   Low back pain radiating to right lower extremity 03/21/2020   B12 deficiency 03/06/2020   Positive anti-CCP test 12/21/2019   Arthralgia 12/13/2019   Dermatitis 12/13/2019   Rheumatoid factor positive 12/13/2019   Essential hypertension 12/11/2018   Palpitations 12/11/2018   Acquired hypothyroidism 11/10/2018   Arthritis of knee 09/17/2016   Anxiety 11/22/2014   Asthma without status asthmaticus 11/22/2014   Benign neoplasm of colon, unspecified 11/22/2014   Environmental allergies 11/22/2014   Hypertriglyceridemia 11/22/2014   Hypokalemia 11/22/2014   Personal history of disease of skin and subcutaneous tissue 11/22/2014   REFERRING DIAG: CVA   THERAPY DIAG:  Muscle weakness (generalized)   Other lack of coordination   Rationale for Evaluation and Treatment Rehabilitation   SUBJECTIVE:   SUBJECTIVE STATEMENT:  Pt. Reports  looking forward to her son, and his family visiting this weekend for the holidays.  Pt accompanied by: significant other   PERTINENT HISTORY: Patient is an 82 year-old female who was admitted to [facility] on 11/23/2022 with a cerebellar CVA. 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 Pt. Was admitted to Inpatient Rehab from 12/21-1/02/2023. 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  Patient underwent inpatient rehabilitation for 2 weeks.  Patient was assessed and was scheduled for a knee replacement on 08/29/2022 however had to cancel it due to having had a CVA.  Patient had a recent fall 2 days after discharging from inpatient rehab. Past Medical History includes: Knee replacement,  essential HTN, hypokalemia, leukocytosis, seizures, positive anti-- CCP test, anxiety disorder, mini strokes.  Patient had shingles with left eye nerve pain s/p 1 year ago.    PRECAUTIONS: Fall   WEIGHT BEARING RESTRICTIONS No   PAIN:  Are you having pain?  No pain.   FALLS: Has patient fallen in last 6 months? Yes. Number of falls 1   LIVING ENVIRONMENT: Lives with: Lives with Spouse Lives in: House/apartment Stairs: 2 storey home, resides on the first floor.  External: 2 stairs front no rails, and 6 in back with rails Has following equipment at home: Single point cane, Walker - 2 wheeled, Environmental consultant - 4 wheeled, Shower bench, and bed side commode   PLOF: Independent   PATIENT GOALS  To Regain the use of her left arm   OBJECTIVE:    HAND DOMINANCE: Right   ADLs: Overall ADLs: Husband assists pt. as needed Transfers/ambulation related to ADLs:Pt. Uses a 3 wheeled walker with Husband assist. Eating: Pt. Is independent with the right hand. Pt. has difficulty cutting food.  Grooming: Pt. is using her right hand, however has difficulty sustaining her LUE in elevation to assist with haircare. UB Dressing: Pt. Is independent donning a pullover shirt, and button down shirt. Has difficulty with buttoning, LB Dressing:  Independent donning pants, and socks. Difficulty tying shoes. Toileting: Independent Bathing: Pt. Is able to engage her right hand. Tub Shower transfers: Supervision Equipment: See above for equipment    IADLs: Shopping:  Has not had the opportunity for grocery shopping yet Light housekeeping: Husband is assisting with light house keeping Meal Prep:  Dependent Community mobility: Relies of family/friends Medication management: Husband assisting with weekly pillbox set-up, and administering medication. Financial management: TBD Handwriting: 75% legible   MOBILITY STATUS: Hx of falls   POSTURE COMMENTS:  No Significant postural limitations Sitting balance: supported  sitting balance WFL   ACTIVITY TOLERANCE: Activity tolerance:  Fatigues in greater than 30 min.    FUNCTIONAL OUTCOME MEASURES: FOTO: 57   UPPER EXTREMITY ROM      Active ROM Right Eval: WFL Left eval Left  01/22/23 Left  03/03/23 Left  04/14/23 Left  05/28/2023 Left 07/23/2023 Left 08/20/2023 Left 09/17/23 Left 10/29/23  Shoulder flexion   132 100 108 108 108 60(70) 70(78) 70(102) 82(110)  Shoulder abduction   80 85 85 85 85 65(78) 72(85) 73(94) 75(98)  Shoulder adduction              Shoulder extension              Shoulder internal rotation              Shoulder external rotation              Elbow flexion   140 140 WFL [facility] WFL  Elbow extension   WNL WNL [facility] WFL WFL  Wrist flexion   65 68         Wrist extension   -10 20 24  32 32 -10 20 20 26   Wrist ulnar deviation     12 10 14 14 10 18 18 20   Wrist radial deviation     8 14 14 16 12 14 14 14   Wrist pronation              Wrist supination              (Blank rows = not tested)   Left digit flexion to [facility]: 2nd: 0cm, 3rd: 0cm, 4th: 0cm, 5th: 0cm   Limited Left full 2nd digit extension     UPPER EXTREMITY MMT:      MMT Right Eval: 4+/5 overall Right 07/23/2023 Left Eval Left 01/22/23 Left 03/03/23 Left  04/14/2023 Left 05/28/2023 Left 07/23/2023 Left 08/20/2023 Left 09/17/23 Left  10/29/23  Shoulder flexion   3+/5 3/5 3-/5 3-/5 3-/5 3-/5 2/5 2+/5 2+/5 3-/5  Shoulder abduction   3+/5 3-/5 3-/5 3-/5 3-/5 3-/5 2/5 2+/5 2+/5 3-/5  Shoulder adduction               Shoulder extension               Shoulder internal rotation               Shoulder external rotation               Middle trapezius               Lower trapezius  Elbow flexion   4/5 3+/5 4/5 N/A 4/5 4/5 3+/5 3+/5 3+/5 3+/5  Elbow extension   4/5 3+/5 4/4 N/A 4/5 4/5 3+/5 3+/5 4-/5 4-/5  Wrist flexion               Wrist extension   4/5 2-/5 3-/5 3-/5 3/5 3+/5 3-/5 3+/5 3+/5 3+/5  Wrist ulnar deviation                Wrist radial deviation               Wrist pronation               Wrist supination               (Blank rows = not tested)   HAND FUNCTION: Grip strength: Right: 26#, Left: 10# Pinch strength: Right 8#, Left: 3#, 3 Pt. Pinch strength: Right: 9#, L: 2#  01/22/2023 Grip strength: Right: 26#, Left: 12# Pinch strength:  Pinch meter used at the initial eval has been sent out for recalibration   03/03/2023 Grip strength: Right: 26#, Left: 13# Pinch strength:  Pinch meter used at the initial eval has been sent out for recalibration  04/14/2023: Grip strength: Right: 26#, Left: 13# Pinch strength:   Right 8#, Left: 4#, 3 Pt. Pinch strength: Right: 9#, L: 3#    05/28/2023: Grip strength: Right: 26#, Left: 13# Pinch strength:   Right 8#, Left: 2#, 3 Pt. Pinch strength: Right: 9#, L: 4#  07/23/2023  Grip strength: Right: 5#, Left: 2# Pinch strength:   Right 6#, Left: 2#, 3 Pt. Pinch strength: Right: 5#, L: 2#  08/20/2023:    Grip strength: Right: 8#, Left: 4# Pinch strength:   Right 6#, Left: 2#, 3 Pt. Pinch strength: Right: 5#, L: 2#    09/17/23:  Grip strength: Right: 5#, Left: 4# Pinch strength:   Right 6#, Left: 2#, 3 Pt. Pinch strength: Right: 5#, L: 2#  10/28/2024  Grip strength: Right: 13#, Left: 6# Pinch strength:   Right 6#, Left: 2#, 3 Pt. Pinch strength: Right: 5#, L: 2#     COORDINATION: Right: 22 sec., Left: <5 min. To place 7 pegs with increased compensation proximally in the trunk, and through reflexive associated reactions.  01/22/23 Right: 22 sec., Left: 3 min. & 4 sec.  03/03/23 Right: 22 sec., Left: 1 min. & 39 sec.  04/14/23   TBD  05/28/23 Right: 22 sec., Left:  5 min.  07/23/2023  Right: 33 sec., Left:  Pt. Is unable to grasp, and place pegs into the pegboard. Pt. Was able to remove 9 vertical pegs in 34 sec.  08/20/2023:  Right: 27 sec., Left:  5 pegs placed in 5 min.; Pt. Was able to remove 9 vertical pegs in 28 sec.  09/17/23:  Right: 26  sec., Left:  3 min, & 49 sec.    10/28/2024:  Right: 26 sec., Left:  2 min, & 10 sec.        SENSATION: Light touch: WFL, proprioceptive awareness: Intact   EDEMA: N/A   MUSCLE TONE: LUE: Hypotonic   COGNITION: Overall cognitive status: WFL for tasks assessed. Pt. Is impulsive at times.   VISION: Subjective report: Pt. report having shingles affecting left eye  s/p 1 year. Has nerve pain Baseline vision: Wears glasses for reading only Visual history:  updated see clinical impression   VISION ASSESSMENT:    WFL for tasks performed   PERCEPTION: Intact   PRAXIS: Impaired: Motor planning  OBSERVATIONS:  Pt. more alert, and engaging since prior to the most recent hospitalization.    TODAY'S TREATMENT:    There. Ex.:   Pt. tolerated PROM/AAROM to the Left shoulder. Pt. worked on [employer], and reciprocal motion using the UBE while seated for 8 min. with no resistance. Constant monitoring was provided. Pt. Worked on pinch strengthening in the left hand for lateral, and 3pt. pinch using yellow, and red resistive clips. Pt. worked on placing the clips at various vertical and horizontal angles. Tactile and verbal cues were required for eliciting the desired movement.    Neuromuscular re-education:   Pt. Worked on grasping one inch resistive cubes alternating thumb opposition to the tip of the 2nd digit. The board was positioned at a vertical angle to encourage wrist extension. Pt. Worked on pressing them back into place while isolating the 2nd digit.       PATIENT EDUCATION: Education details: LUE therapeutic exercise Person educated: Patient and Spouse Education method: Medical illustrator Education comprehension: verbalized understanding, returned demonstration, and needs further education   HOME EXERCISE PROGRAM:   Reviewed activities at home to promote isolated 2nd digit extension.    GOALS: Goals reviewed with patient? Yes   SHORT TERM GOALS:  Target date: 12/10/2023   1. Patient will be independent with home exercise program for the left upper extremity Baseline: 10/29/2023: Independent 09/17/23: Independent 08/20/23: Pt. continues to require assist from he husband for HEPs 07/23/2023: Pt. continues to require assist. 05/28/2023: Pt. Requires assist 04/09/2023: Pt. Continues to consistently attempt to engage her hand at hand 02/01/2023: Pt. Consistently attempts to perform HEPs independently. No current home exercise program Goal status: Ongoing   LONG TERM GOALS: Target date:  01/21/2024  1.  Patient will improve left shoulder strength by 2 mm grades to be able to sustain UE's in elevation long enough to wash her hair.  Baseline: 10/29/2023: Left shoulder flexion 3-/5, abduction: 3-/5 10/27/2023:  Pt. Is improving with reaching up to reach her hair for hair care. Pt. Continues to have difficulty sustaining the UE in elevation long enough to perform hair care. 09/17/2023: Left shoulder 2+/5, abduction 2+/5 08/20/2023: Left shoulder flexion 2+/5, abduction 2+/5  07/23/2023: Left shoulder flexion: 2/5, abduction: 2/5. 05/28/2023: Left shoulder flexion: 3-/5, abduction: 3-/5. Pt. has an old left shoulder injury limiting progression with left shoulder strength 05/26/2023: Pt. continues to present with limitiations in sustaining LUE elevation long enough to perform hair care thoroughly 04/09/2023: Pt. is limited with sustaining LUE elevation long enough to perform hair care thoroughly. 03/03/2023: Left shoulder flexion: 3-/5, abduction: 3-/5 01/22/2023: Left shoulder flexion: 3-/5, abduction: 3-/5 Eval: Left shoulder flexion: 3/5, abduction: 3-/5 Goal status: Ongoing   2.  Patient will improve left shoulder active abduction to be able to comb her hair Baseline: 10/29/2023: Left shoulder abduction: 75(98) 10/27/2023: Pt. Is improving with reaching the left side of her hair for haircare. Pt. Has difficulty reaching with the left side to the back, and right side  of her head.09/17/2023: 73(94) Pt. is able to reach her left side, and back of hair. 9/11/09/2023: 72(85) Pt. is able to reach her left side, and back of hair. 07/23/2023: Left shoulder flexion: 60(70) abduction: 65(78) 05/28/2023: left shoulder flexion: 108 abduction: 85 Pt. Has an old left shoulder injury limiting progression with left shoulder ROM 05/26/2023:  5/10 left shoulder pain with ROM limits using it functionally during hair care. 04/09/2023: Pt. Presents with difficulty abducting her left shoulder enough to thoroughly complete haircare.  03/03/2023: Shoulder abduction: 85 01/22/2023: Shoulder abduction: 85 Eval: Left shoulder abduction is 80(108) Goal status: Ongoing   3.  Patient will independently button her shirt with modified independence. Baseline:10/29/2023: Pt. Has difficulty manipulating buttons, will benefit from a buttonhook.10/27/23: Pt. Requires increased time to complete buttons.09/17/2023: Pt. Has a buttonhook, however does not know where it is. Pt. requires increased time to complete, and assist from her husband. 08/20/2023: Pt. requires increased time to complete, and assist from her husband. 07/23/2023 Pt. presents with difficulty buttoning her shirt. 05/28/2023: pt. Continues to work towards progressing with buttoning 05/26/2023:  Pt. Continues to progress towards buttoning. 04/09/2023: Pt. Continues to progress towards buttoning. 03/03/2023: Pt. Continues to have difficulty with buttoning. 01/22/2023: Pt. continues to have difficulty. Eval: Patient has difficulty.  Goal status: Ongoing   4.  Patient well improve left grip strength in preparatio for securely hold a glass/beverage Baseline: 10/29/2023: Grip strength: Right: 13#  Left: 6#10/27/2023: Pt. Has difficulty securely holding heavier items with larger diameter. 09/17/2023: Grip strength: Right: 5#, Left: 4# Pt. Has difficulty holding a glass. Right: 8#, Left: 4# Pt. Is able to more securely hold mirrors, and flowers with the left hand,  however has difficulty holding a glass beverage, or bottle. 08/20/2023: Right: 8#, Left: 4# Pt. Is able to more securely hold mirrors, and flowers with the left hand, however has difficulty holding a glass beverage, or bottle. 07/23/2023: Grip strength: Right: 5#, Left: 2# 05/28/2023: Right: 26#, Left: 13#  pt. Presents with difficulty securely holding flowers in her left hand. 05/26/2023: TBD 04/09/2023: Pt. Is able to hold, and hike pants with the left hand, continues to have difficulty with securely holding flowers.   03/03/2023: left grip strength: 13# 01/22/2023: Left: 12# Eval: Pt. Is unable to securely hold flowers. Goal status: Ongoing   5.  Pt. will independently recall adaptive  strategies for performing ADL/ADL kitchen tasks.  Baseline:10/29/2023: Continue 11/19/204: Continue 08/20/2023: Revised to include IADL kitchen tasks.  07/23/2023: Continue 05/28/2023: Pt. Needs continued education about adaptive strategies.05/26/2023: Continue 04/09/2023: Continue3/26/2024: Continue 01/22/2023: Pt. continues to benefit from education about adaptive strategies during ADLs, and IADLs. Eval: Pt. to be provided with adaptive strategies. Goal status: Revised   6.  Pt. will improve FOTO score by 2 points to reflect patient perceived performance improvement assessment specific ADLs  and IADLs Baseline: 95/62/1308: 66 10/27/2023: TBD 09/17/2023: FOTO score: 61 07/23/2023: FOTO score: 57 05/28/2023: FOTO score 59 05/26/2023: TBD 5/02/204:  TBD 03/03/23: FOTO 62 Eval: 57 Goal status: Ongoing  7.  Pt. will improve left hand coordination skills in order to be able to  manipulate small objects.     Baseline: 10/29/2023 Left: 2 min. & 10 sec. 10/27/2023: Pt. Is improving with manipulating small objects.09/17/2023: Right: 26 sec., Left:  3 min, & 49 sec. pegs placed in 5 min.; Pt. Was able to remove 9 vertical pegs in 24 sec. 08/20/2023: Right: 27 sec., Left:  5 pegs placed in 5 min.; Pt. Was able to remove 9 vertical pegs in 28 sec.  Pt. Is able to hold, and sort utensils in a drawer. Pt. Has difficulty manipulating small objects. 07/23/2023: Right: 33 sec., Left:  Pt. Is unable to grasp, and place pegs into the pegboard. Pt. Was able to remove 9 vertical pegs in 34 sec. 05/28/2023: Left:  5 min. 05/26/2023 Pt. Continues to present with difficulty handling and sorting utensils. 04/14/2023: 56 04/09/2023: TBD 03/03/23: 1 min. & 39 01/22/2022: Left: 3 min. & 4 sec. Eval: Pt.  has difficulty sorting, and placing utensils with the left hand. Left FMC : >5 min. For 7 pegs on the 9 hole peg test.    Goal Status: Ongoing  8. Pt. will improve active left 2nd digit extension to be able able to isolate her 2nd digit in preparation for pressing/pushing buttons on appliances, phones, or remotes. Baseline: 10/29/2023: Pt. Is improving with 2nd digit isolation, to however continues to present with limited prolonged 2nd digit extension11/19/2024: Pt. Is improving with 2nd digit isolation, to however presents with limited prolonged 2nd digit extension. 09/17/2023: Pt. Presents with limited isolated 2nd digit extension with increased time. 08/20/2023: Pt. Is more consistently able to isolate 2nd digit extension with increased time. 07/23/2023: Pt. Continues to work on consistency with isolating left 2nd digit extension 05/28/2023: Pt. Continues to improve with left 2nd digit extension 05/26/2023: Improving with left 2nd digit extension. 04/09/2023: Pt. Is progressing with isolating left 2nd digit extension, however continues to present with limited increased flexor tone. 03/03/23: Pt. Continues to work on improving consistency with 2nd digit extension to press the remote. 01/22/2023: is able to perform full digit extension, although 2nd digit is slow to extend s 2/2 flexor tone. Pt.  Eval: Pt. is able to is unable to actively perform full digit extension Goal status: Ongoing  9: Pt. Will cut food with modified independence Baseline: 10/29/2023: : Pt. Continues to have  difficulty cutting food on her plate.10/27/2023: Pt. Continues to have difficulty cutting food on her plate. 09/17/2023: Pt. Is unable to cut the food on her plate, as well as cut food for meal preparation. 08/20/2023: Pt. Is unable to cut the food on her plate, as well as cut food for meal preparation.    Goal Status: Ongoing   ASSESSMENT:   CLINICAL IMPRESSION:  Pt. Tolerated the UE exercise, and FMC tasks well today. Pt. Attempted to perform lateral pinch with the green level resistive clips, however had difficulty performing the latera, and 3pt. Pinch at that resistance level. Pt. Required verbal cues, and cues for visual demonstration if hand position with completing the resistive cube task. Pt. continues to work on improving LUE ROM, strength, FMC, and hand function skills in order to work towards improving, and maximizing independence with ADLs, and IADL tasks.   PERFORMANCE DEFICITS in functional skills including ADLs, IADLs, coordination, proprioception, ROM, strength, FMC, and GMC, cognitive skills including memory, and psychosocial skills including coping strategies, environmental adaptation, interpersonal interactions, and routines and behaviors.    IMPAIRMENTS are limiting patient from ADLs, IADLs, education, leisure, and social participation.    COMORBIDITIES may have co-morbidities  that affects occupational performance. Patient will benefit from skilled OT to address above impairments and improve overall function.   MODIFICATION OR ASSISTANCE TO COMPLETE EVALUATION: Min-Moderate modification of tasks or assist with assess necessary to complete an evaluation.   OT OCCUPATIONAL PROFILE AND HISTORY: Detailed assessment: Review of records and additional review of physical, cognitive, psychosocial history related to current functional performance.   CLINICAL DECISION MAKING: Moderate - several treatment options, min-mod task modification necessary   REHAB POTENTIAL: Good   EVALUATION  COMPLEXITY: Moderate      PLAN: OT FREQUENCY: 2x/week   OT DURATION: 12 weeks   PLANNED INTERVENTIONS: self care/ADL training, therapeutic exercise, therapeutic activity, neuromuscular re-education, manual therapy, passive range of motion, functional mobility training, electrical stimulation, and paraffin   RECOMMENDED OTHER SERVICES: PT   CONSULTED AND AGREED WITH PLAN OF CARE: Patient and family Adult nurse   PLAN FOR  NEXT SESSION: see above   Olegario Messier, MS, OTR/L  11/25/23, 8:25 AM

## 2023-11-26 ENCOUNTER — Ambulatory Visit: Payer: Medicare HMO | Admitting: Physical Therapy

## 2023-11-26 ENCOUNTER — Ambulatory Visit: Payer: Medicare HMO | Admitting: Occupational Therapy

## 2023-11-26 DIAGNOSIS — R531 Weakness: Secondary | ICD-10-CM

## 2023-11-26 DIAGNOSIS — R262 Difficulty in walking, not elsewhere classified: Secondary | ICD-10-CM

## 2023-11-26 DIAGNOSIS — R2681 Unsteadiness on feet: Secondary | ICD-10-CM

## 2023-11-26 DIAGNOSIS — M6281 Muscle weakness (generalized): Secondary | ICD-10-CM

## 2023-11-26 DIAGNOSIS — R278 Other lack of coordination: Secondary | ICD-10-CM

## 2023-11-26 NOTE — Therapy (Signed)
Occupational Therapy Neuro Treatment Note    Patient Name: [name] MRN: [id number] DOB:04-30-1941, 82 y.o., female Today's Date: 11/26/2023  PCP: Dr. Judithann Sheen REFERRING PROVIDER:  Dr. Judithann Sheen  END OF SESSION:  PLAN:   OT End of Session - 11/26/23 2220     Visit Number [id number]    Number of Visits 96    Date for OT Re-Evaluation 01/21/24    Authorization Time Period Progress report period starting 12/16/2022    OT Start Time 1534    OT Stop Time 1615    OT Time Calculation (min) 41 min    Activity Tolerance Patient tolerated treatment well    Behavior During Therapy WFL for tasks assessed/performed                   Past Medical History:  Diagnosis Date   Anemia    Arthritis    Asthma    uses inhaler just prior to surgery to avoid attack   Back pain    from previous injury   Complication of anesthesia    has woken  up during 2 different surgery   Depression    no current issue/treatment; situation   Gallstones    GERD (gastroesophageal reflux disease)    Hiatal hernia    patient does NOT have nerve/muscle disease   History of kidney stones    HLD (hyperlipidemia)    HTN (hypertension)    Hypothyroidism    Kidney stones    Knee pain    Nausea and vomiting 10/15/2022   Non-diabetic pancreatic hormone dysfunction years   pt. 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   Pancreatitis    Pneumonia    Seizures (HCC)    caused by dye injected during a procedure   Shortness of breath    with exertion   Sinus problem    frequent infections/congestion   Stroke (HCC) 2021   reports having CVA in 2021 and having mini strokes before that   Thyroid disease    Past Surgical History:  Procedure Laterality Date   ABDOMINAL HYSTERECTOMY     APPENDECTOMY     CARPAL TUNNEL RELEASE  10+ years ago   bilateral   EYE SURGERY  3 yrs ago   bilateral cataracts   FOOT OSTEOTOMY  6 weeks ago   Left foot: great, 2nd & 3rd   FOOT OSTEOTOMY  5 years ago   Right  great toe   HAND SURGERY Bilateral 2011-most recent   multiple hand surgeries, 2 on left, 3 on right   KNEE ARTHROPLASTY Right 04/28/2022   Procedure: COMPUTER ASSISTED TOTAL KNEE ARTHROPLASTY;  Surgeon: Donato Heinz, MD;  Location: [facility];  Service: Orthopedics;  Laterality: Right;   LOOP RECORDER INSERTION N/A 05/16/2020   Procedure: LOOP RECORDER INSERTION;  Surgeon: Marcina Millard, MD;  Location: [facility];  Service: Cardiovascular;  Laterality: N/A;   NASAL SINUS SURGERY  most recent 7-8 yrs ago   7 sinus surgeries    TRIGGER FINGER RELEASE  11/19/2011   Procedure: RELEASE TRIGGER FINGER/A-1 PULLEY;  Surgeon: Nicki Reaper, MD;  Location: [facility];  Service: Orthopedics;  Laterality: Right;  release a-1 pulley right index finger and cyst removal   WRIST GANGLION EXCISION  1980's   right   Patient Active Problem List   Diagnosis Date Noted   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 11/27/2022   Hypomagnesemia 11/27/2022   Occlusion  of right vertebral artery 11/23/2022   HLD (hyperlipidemia) 11/23/2022   Asthma 11/23/2022   Depression with anxiety 11/23/2022   Chronic diastolic CHF (congestive heart failure) (HCC) 11/23/2022   Normocytic anemia 11/23/2022   Aspiration pneumonia (HCC) 11/23/2022   AKI (acute kidney injury) (HCC) 11/23/2022   Abdominal pain 11/23/2022   Mesenteric mass 11/23/2022   Coffee ground emesis 11/23/2022   Nausea and vomiting 10/15/2022   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   Fatigue 09/17/2022   Left hemiparesis (HCC) 09/17/2022   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HCC) 08/22/2022   GERD (gastroesophageal reflux disease) 08/21/2022   Agitation 08/20/2022   Acute left-sided weakness 08/20/2022   Expressive aphasia    Stroke (HCC) 08/19/2022   Leukocytosis 08/19/2022   History of urticaria 04/28/2022   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   Primary osteoarthritis of left knee 02/24/2022   Primary  osteoarthritis of right knee 02/24/2022   Lumbar spondylolysis 04/12/2020   History of CVA (cerebrovascular accident) 03/26/2020   Low back pain radiating to right lower extremity 03/21/2020   B12 deficiency 03/06/2020   Positive anti-CCP test 12/21/2019   Arthralgia 12/13/2019   Dermatitis 12/13/2019   Rheumatoid factor positive 12/13/2019   Essential hypertension 12/11/2018   Palpitations 12/11/2018   Acquired hypothyroidism 11/10/2018   Arthritis of knee 09/17/2016   Anxiety 11/22/2014   Asthma without status asthmaticus 11/22/2014   Benign neoplasm of colon, unspecified 11/22/2014   Environmental allergies 11/22/2014   Hypertriglyceridemia 11/22/2014   Hypokalemia 11/22/2014   Personal history of disease of skin and subcutaneous tissue 11/22/2014   REFERRING DIAG: CVA   THERAPY DIAG:  Muscle weakness (generalized)   Other lack of coordination   Rationale for Evaluation and Treatment Rehabilitation   SUBJECTIVE:   SUBJECTIVE STATEMENT:  Pt. Reports  looking forward to her son, and his family visiting this weekend for the holidays.  Pt accompanied by: significant other   PERTINENT HISTORY: Patient is an 82 year-old female who was admitted to [facility] on 11/23/2022 with a cerebellar CVA. 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 Pt. Was admitted to Inpatient Rehab from 12/21-1/02/2023. 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  Patient underwent inpatient rehabilitation for 2 weeks.  Patient was assessed and was scheduled for a knee replacement on 08/29/2022 however had to cancel it due to having had a CVA.  Patient had a recent fall 2 days after discharging from inpatient rehab. Past Medical History includes: Knee replacement, essential HTN, hypokalemia, leukocytosis, seizures, positive anti-- CCP test, anxiety disorder, mini strokes.  Patient had shingles with left eye nerve pain s/p 1 year  ago.    PRECAUTIONS: Fall   WEIGHT BEARING RESTRICTIONS No   PAIN:  Are you having pain?  No pain.   FALLS: Has patient fallen in last 6 months? Yes. Number of falls 1   LIVING ENVIRONMENT: Lives with: Lives with Spouse Lives in: House/apartment Stairs: 2 storey home, resides on the first floor.  External: 2 stairs front no rails, and 6 in back with rails Has following equipment at home: Single point cane, Walker - 2 wheeled, Environmental consultant - 4 wheeled, Shower bench, and bed side commode   PLOF: Independent   PATIENT GOALS  To Regain the use of her left arm   OBJECTIVE:    HAND DOMINANCE: Right   ADLs: Overall ADLs: Husband assists pt. as needed Transfers/ambulation related to ADLs:Pt. Uses a 3 wheeled walker with Husband assist. Eating: Pt. Is independent  with the right hand. Pt. has difficulty cutting food. Grooming: Pt. is using her right hand, however has difficulty sustaining her LUE in elevation to assist with haircare. UB Dressing: Pt. Is independent donning a pullover shirt, and button down shirt. Has difficulty with buttoning, LB Dressing:  Independent donning pants, and socks. Difficulty tying shoes. Toileting: Independent Bathing: Pt. Is able to engage her right hand. Tub Shower transfers: Supervision Equipment: See above for equipment    IADLs: Shopping:  Has not had the opportunity for grocery shopping yet Light housekeeping: Husband is assisting with light house keeping Meal Prep:  Dependent Community mobility: Relies of family/friends Medication management: Husband assisting with weekly pillbox set-up, and administering medication. Financial management: TBD Handwriting: 75% legible   MOBILITY STATUS: Hx of falls   POSTURE COMMENTS:  No Significant postural limitations Sitting balance: supported sitting balance WFL   ACTIVITY TOLERANCE: Activity tolerance:  Fatigues in greater than 30 min.    FUNCTIONAL OUTCOME MEASURES: FOTO: 57   UPPER EXTREMITY ROM       Active ROM Right Eval: WFL Left eval Left  01/22/23 Left  03/03/23 Left  04/14/23 Left  05/28/2023 Left 07/23/2023 Left 08/20/2023 Left 09/17/23 Left 10/29/23  Shoulder flexion   132 100 108 108 108 60(70) 70(78) 70(102) 82(110)  Shoulder abduction   80 85 85 85 85 65(78) 72(85) 73(94) 75(98)  Shoulder adduction              Shoulder extension              Shoulder internal rotation              Shoulder external rotation              Elbow flexion   140 140 WFL [facility] WFL  Elbow extension   WNL WNL [facility] WFL [facility] WFL WFL  Wrist flexion   65 68         Wrist extension   -10 20 24  32 32 -10 20 20 26   Wrist ulnar deviation     12 10 14 14 10 18 18 20   Wrist radial deviation     8 14 14 16 12 14 14 14   Wrist pronation              Wrist supination              (Blank rows = not tested)   Left digit flexion to [facility]: 2nd: 0cm, 3rd: 0cm, 4th: 0cm, 5th: 0cm   Limited Left full 2nd digit extension     UPPER EXTREMITY MMT:      MMT Right Eval: 4+/5 overall Right 07/23/2023 Left Eval Left 01/22/23 Left 03/03/23 Left  04/14/2023 Left 05/28/2023 Left 07/23/2023 Left 08/20/2023 Left 09/17/23 Left  10/29/23  Shoulder flexion   3+/5 3/5 3-/5 3-/5 3-/5 3-/5 2/5 2+/5 2+/5 3-/5  Shoulder abduction   3+/5 3-/5 3-/5 3-/5 3-/5 3-/5 2/5 2+/5 2+/5 3-/5  Shoulder adduction               Shoulder extension               Shoulder internal rotation               Shoulder external rotation               Middle trapezius               Lower trapezius  Elbow flexion   4/5 3+/5 4/5 N/A 4/5 4/5 3+/5 3+/5 3+/5 3+/5  Elbow extension   4/5 3+/5 4/4 N/A 4/5 4/5 3+/5 3+/5 4-/5 4-/5  Wrist flexion               Wrist extension   4/5 2-/5 3-/5 3-/5 3/5 3+/5 3-/5 3+/5 3+/5 3+/5  Wrist ulnar deviation               Wrist radial deviation               Wrist pronation               Wrist supination               (Blank rows = not tested)   HAND FUNCTION: Grip strength:  Right: 26#, Left: 10# Pinch strength: Right 8#, Left: 3#, 3 Pt. Pinch strength: Right: 9#, L: 2#  01/22/2023 Grip strength: Right: 26#, Left: 12# Pinch strength:  Pinch meter used at the initial eval has been sent out for recalibration   03/03/2023 Grip strength: Right: 26#, Left: 13# Pinch strength:  Pinch meter used at the initial eval has been sent out for recalibration  04/14/2023: Grip strength: Right: 26#, Left: 13# Pinch strength:   Right 8#, Left: 4#, 3 Pt. Pinch strength: Right: 9#, L: 3#    05/28/2023: Grip strength: Right: 26#, Left: 13# Pinch strength:   Right 8#, Left: 2#, 3 Pt. Pinch strength: Right: 9#, L: 4#  07/23/2023  Grip strength: Right: 5#, Left: 2# Pinch strength:   Right 6#, Left: 2#, 3 Pt. Pinch strength: Right: 5#, L: 2#  08/20/2023:    Grip strength: Right: 8#, Left: 4# Pinch strength:   Right 6#, Left: 2#, 3 Pt. Pinch strength: Right: 5#, L: 2#    09/17/23:  Grip strength: Right: 5#, Left: 4# Pinch strength:   Right 6#, Left: 2#, 3 Pt. Pinch strength: Right: 5#, L: 2#  10/28/2024  Grip strength: Right: 13#, Left: 6# Pinch strength:   Right 6#, Left: 2#, 3 Pt. Pinch strength: Right: 5#, L: 2#     COORDINATION: Right: 22 sec., Left: <5 min. To place 7 pegs with increased compensation proximally in the trunk, and through reflexive associated reactions.  01/22/23 Right: 22 sec., Left: 3 min. & 4 sec.  03/03/23 Right: 22 sec., Left: 1 min. & 39 sec.  04/14/23   TBD  05/28/23 Right: 22 sec., Left:  5 min.  07/23/2023  Right: 33 sec., Left:  Pt. Is unable to grasp, and place pegs into the pegboard. Pt. Was able to remove 9 vertical pegs in 34 sec.  08/20/2023:  Right: 27 sec., Left:  5 pegs placed in 5 min.; Pt. Was able to remove 9 vertical pegs in 28 sec.  09/17/23:  Right: 26 sec., Left:  3 min, & 49 sec.    10/28/2024:  Right: 26 sec., Left:  2 min, & 10 sec.        SENSATION: Light touch: WFL, proprioceptive awareness: Intact    EDEMA: N/A   MUSCLE TONE: LUE: Hypotonic   COGNITION: Overall cognitive status: WFL for tasks assessed. Pt. Is impulsive at times.   VISION: Subjective report: Pt. report having shingles affecting left eye  s/p 1 year. Has nerve pain Baseline vision: Wears glasses for reading only Visual history:  updated see clinical impression   VISION ASSESSMENT:    WFL for tasks performed   PERCEPTION: Intact   PRAXIS: Impaired: Motor planning  OBSERVATIONS:  Pt. more alert, and engaging since prior to the most recent hospitalization.    TODAY'S TREATMENT:    There. Ex.:   Pt. tolerated PROM/AAROM to the Left shoulder. Pt. worked on [employer], and reciprocal motion using the UBE while seated for 10 min. with no resistance. Constant monitoring was provided. Pt. Worked on pinch strengthening in the left hand for lateral, and 3pt. pinch using yellow, and red resistive clips. Pt. worked on placing the clips at various vertical and horizontal angles. Tactile and verbal cues were required for eliciting the desired movement.    Neuromuscular re-education:   Pt. performed [facility] tasks using the Grooved pegboard. Pt. worked on grasping the grooved pegs from a horizontal position, and moving the pegs to a vertical position in the hand to prepare for placing them in the grooved slot.        PATIENT EDUCATION: Education details: LUE therapeutic exercise Person educated: Patient and Spouse Education method: Medical illustrator Education comprehension: verbalized understanding, returned demonstration, and needs further education   HOME EXERCISE PROGRAM:   Reviewed activities at home to promote isolated 2nd digit extension.    GOALS: Goals reviewed with patient? Yes   SHORT TERM GOALS: Target date: 12/10/2023   1. Patient will be independent with home exercise program for the left upper extremity Baseline: 10/29/2023: Independent 09/17/23: Independent 08/20/23: Pt. continues to  require assist from he husband for HEPs 07/23/2023: Pt. continues to require assist. 05/28/2023: Pt. Requires assist 04/09/2023: Pt. Continues to consistently attempt to engage her hand at hand 02/01/2023: Pt. Consistently attempts to perform HEPs independently. No current home exercise program Goal status: Ongoing   LONG TERM GOALS: Target date:  01/21/2024  1.  Patient will improve left shoulder strength by 2 mm grades to be able to sustain UE's in elevation long enough to wash her hair.  Baseline: 10/29/2023: Left shoulder flexion 3-/5, abduction: 3-/5 10/27/2023:  Pt. Is improving with reaching up to reach her hair for hair care. Pt. Continues to have difficulty sustaining the UE in elevation long enough to perform hair care. 09/17/2023: Left shoulder 2+/5, abduction 2+/5 08/20/2023: Left shoulder flexion 2+/5, abduction 2+/5  07/23/2023: Left shoulder flexion: 2/5, abduction: 2/5. 05/28/2023: Left shoulder flexion: 3-/5, abduction: 3-/5. Pt. has an old left shoulder injury limiting progression with left shoulder strength 05/26/2023: Pt. continues to present with limitiations in sustaining LUE elevation long enough to perform hair care thoroughly 04/09/2023: Pt. is limited with sustaining LUE elevation long enough to perform hair care thoroughly. 03/03/2023: Left shoulder flexion: 3-/5, abduction: 3-/5 01/22/2023: Left shoulder flexion: 3-/5, abduction: 3-/5 Eval: Left shoulder flexion: 3/5, abduction: 3-/5 Goal status: Ongoing   2.  Patient will improve left shoulder active abduction to be able to comb her hair Baseline: 10/29/2023: Left shoulder abduction: 75(98) 10/27/2023: Pt. Is improving with reaching the left side of her hair for haircare. Pt. Has difficulty reaching with the left side to the back, and right side of her head.09/17/2023: 73(94) Pt. is able to reach her left side, and back of hair. 9/11/09/2023: 72(85) Pt. is able to reach her left side, and back of hair. 07/23/2023: Left shoulder flexion:  60(70) abduction: 65(78) 05/28/2023: left shoulder flexion: 108 abduction: 85 Pt. Has an old left shoulder injury limiting progression with left shoulder ROM 05/26/2023:  5/10 left shoulder pain with ROM limits using it functionally during hair care. 04/09/2023: Pt. Presents with difficulty abducting her left shoulder enough to thoroughly complete haircare. 03/03/2023: Shoulder abduction:  85 01/22/2023: Shoulder abduction: 85 Eval: Left shoulder abduction is 80(108) Goal status: Ongoing   3.  Patient will independently button her shirt with modified independence. Baseline:10/29/2023: Pt. Has difficulty manipulating buttons, will benefit from a buttonhook.10/27/23: Pt. Requires increased time to complete buttons.09/17/2023: Pt. Has a buttonhook, however does not know where it is. Pt. requires increased time to complete, and assist from her husband. 08/20/2023: Pt. requires increased time to complete, and assist from her husband. 07/23/2023 Pt. presents with difficulty buttoning her shirt. 05/28/2023: pt. Continues to work towards progressing with buttoning 05/26/2023:  Pt. Continues to progress towards buttoning. 04/09/2023: Pt. Continues to progress towards buttoning. 03/03/2023: Pt. Continues to have difficulty with buttoning. 01/22/2023: Pt. continues to have difficulty. Eval: Patient has difficulty.  Goal status: Ongoing   4.  Patient well improve left grip strength in preparatio for securely hold a glass/beverage Baseline: 10/29/2023: Grip strength: Right: 13#  Left: 6#10/27/2023: Pt. Has difficulty securely holding heavier items with larger diameter. 09/17/2023: Grip strength: Right: 5#, Left: 4# Pt. Has difficulty holding a glass. Right: 8#, Left: 4# Pt. Is able to more securely hold mirrors, and flowers with the left hand, however has difficulty holding a glass beverage, or bottle. 08/20/2023: Right: 8#, Left: 4# Pt. Is able to more securely hold mirrors, and flowers with the left hand, however has difficulty  holding a glass beverage, or bottle. 07/23/2023: Grip strength: Right: 5#, Left: 2# 05/28/2023: Right: 26#, Left: 13#  pt. Presents with difficulty securely holding flowers in her left hand. 05/26/2023: TBD 04/09/2023: Pt. Is able to hold, and hike pants with the left hand, continues to have difficulty with securely holding flowers.   03/03/2023: left grip strength: 13# 01/22/2023: Left: 12# Eval: Pt. Is unable to securely hold flowers. Goal status: Ongoing   5.  Pt. will independently recall adaptive  strategies for performing ADL/ADL kitchen tasks.  Baseline:10/29/2023: Continue 11/19/204: Continue 08/20/2023: Revised to include IADL kitchen tasks.  07/23/2023: Continue 05/28/2023: Pt. Needs continued education about adaptive strategies.05/26/2023: Continue 04/09/2023: Continue3/26/2024: Continue 01/22/2023: Pt. continues to benefit from education about adaptive strategies during ADLs, and IADLs. Eval: Pt. to be provided with adaptive strategies. Goal status: Revised   6.  Pt. will improve FOTO score by 2 points to reflect patient perceived performance improvement assessment specific ADLs  and IADLs Baseline: 10/96/0454: 66 10/27/2023: TBD 09/17/2023: FOTO score: 61 07/23/2023: FOTO score: 57 05/28/2023: FOTO score 59 05/26/2023: TBD 5/02/204:  TBD 03/03/23: FOTO 62 Eval: 57 Goal status: Ongoing  7.  Pt. will improve left hand coordination skills in order to be able to  manipulate small objects.     Baseline: 10/29/2023 Left: 2 min. & 10 sec. 10/27/2023: Pt. Is improving with manipulating small objects.09/17/2023: Right: 26 sec., Left:  3 min, & 49 sec. pegs placed in 5 min.; Pt. Was able to remove 9 vertical pegs in 24 sec. 08/20/2023: Right: 27 sec., Left:  5 pegs placed in 5 min.; Pt. Was able to remove 9 vertical pegs in 28 sec. Pt. Is able to hold, and sort utensils in a drawer. Pt. Has difficulty manipulating small objects. 07/23/2023: Right: 33 sec., Left:  Pt. Is unable to grasp, and place pegs into the  pegboard. Pt. Was able to remove 9 vertical pegs in 34 sec. 05/28/2023: Left:  5 min. 05/26/2023 Pt. Continues to present with difficulty handling and sorting utensils. 04/14/2023: 56 04/09/2023: TBD 03/03/23: 1 min. & 39 01/22/2022: Left: 3 min. & 4 sec. Eval: Pt. has difficulty sorting,  and placing utensils with the left hand. Left FMC : >5 min. For 7 pegs on the 9 hole peg test.    Goal Status: Ongoing  8. Pt. will improve active left 2nd digit extension to be able able to isolate her 2nd digit in preparation for pressing/pushing buttons on appliances, phones, or remotes. Baseline: 10/29/2023: Pt. Is improving with 2nd digit isolation, to however continues to present with limited prolonged 2nd digit extension11/19/2024: Pt. Is improving with 2nd digit isolation, to however presents with limited prolonged 2nd digit extension. 09/17/2023: Pt. Presents with limited isolated 2nd digit extension with increased time. 08/20/2023: Pt. Is more consistently able to isolate 2nd digit extension with increased time. 07/23/2023: Pt. Continues to work on consistency with isolating left 2nd digit extension 05/28/2023: Pt. Continues to improve with left 2nd digit extension 05/26/2023: Improving with left 2nd digit extension. 04/09/2023: Pt. Is progressing with isolating left 2nd digit extension, however continues to present with limited increased flexor tone. 03/03/23: Pt. Continues to work on improving consistency with 2nd digit extension to press the remote. 01/22/2023: is able to perform full digit extension, although 2nd digit is slow to extend s 2/2 flexor tone. Pt.  Eval: Pt. is able to is unable to actively perform full digit extension Goal status: Ongoing  9: Pt. Will cut food with modified independence Baseline: 10/29/2023: : Pt. Continues to have difficulty cutting food on her plate.10/27/2023: Pt. Continues to have difficulty cutting food on her plate. 09/17/2023: Pt. Is unable to cut the food on her plate, as well as cut  food for meal preparation. 08/20/2023: Pt. Is unable to cut the food on her plate, as well as cut food for meal preparation.    Goal Status: Ongoing   ASSESSMENT:   CLINICAL IMPRESSION:  Pt. tolerated the UE exercise well today, and was able to tolerate increased time to 10 min. Pt. was able to grasp grooved pegs from the horizontal position, and place them vertically into the grooved pegboard, successfully completing one row. Pt. presented with difficulty turning the pegs into position within the tips of her fingers. Pt. continues to work on improving LUE ROM, strength, FMC, and hand function skills in order to work towards improving, and maximizing independence with ADLs, and IADL tasks.   PERFORMANCE DEFICITS in functional skills including ADLs, IADLs, coordination, proprioception, ROM, strength, FMC, and GMC, cognitive skills including memory, and psychosocial skills including coping strategies, environmental adaptation, interpersonal interactions, and routines and behaviors.    IMPAIRMENTS are limiting patient from ADLs, IADLs, education, leisure, and social participation.    COMORBIDITIES may have co-morbidities  that affects occupational performance. Patient will benefit from skilled OT to address above impairments and improve overall function.   MODIFICATION OR ASSISTANCE TO COMPLETE EVALUATION: Min-Moderate modification of tasks or assist with assess necessary to complete an evaluation.   OT OCCUPATIONAL PROFILE AND HISTORY: Detailed assessment: Review of records and additional review of physical, cognitive, psychosocial history related to current functional performance.   CLINICAL DECISION MAKING: Moderate - several treatment options, min-mod task modification necessary   REHAB POTENTIAL: Good   EVALUATION COMPLEXITY: Moderate      PLAN: OT FREQUENCY: 2x/week   OT DURATION: 12 weeks   PLANNED INTERVENTIONS: self care/ADL training, therapeutic exercise, therapeutic activity,  neuromuscular re-education, manual therapy, passive range of motion, functional mobility training, electrical stimulation, and paraffin   RECOMMENDED OTHER SERVICES: PT   CONSULTED AND AGREED WITH PLAN OF CARE: Patient and family member/caregiver   PLAN FOR NEXT  SESSION: see above   Olegario Messier, MS, OTR/L  11/26/23, 10:26 PM

## 2023-11-26 NOTE — Therapy (Signed)
OUTPATIENT PHYSICAL THERAPY TREATMENT    Patient Name: [name] MRN: [id number] DOB:05-16-1941, 82 y.o., female Today's Date: 11/26/2023   PCP: Aram Beecham, D MD REFERRING PROVIDER: Jacquelynn Cree PA   END OF SESSION:  PT End of Session - 11/26/23 1731     Visit Number 22    Number of Visits 31    Date for PT Re-Evaluation 12/24/23    Authorization Type [employer] Medicare:    Authorization Time Period approved for 24 additional tx from 09/28/23 to 12/24/23; total visit count 31    Progress Note Due on Visit 30    PT Start Time 1615    PT Stop Time 1700    PT Time Calculation (min) 45 min    Equipment Utilized During Treatment Gait belt    Activity Tolerance Patient tolerated treatment well;No increased pain    Behavior During Therapy WFL for tasks assessed/performed                 Past Medical History:  Diagnosis Date   Anemia    Arthritis    Asthma    uses inhaler just prior to surgery to avoid attack   Back pain    from previous injury   Complication of anesthesia    has woken  up during 2 different surgery   Depression    no current issue/treatment; situation   Gallstones    GERD (gastroesophageal reflux disease)    Hiatal hernia    patient does NOT have nerve/muscle disease   History of kidney stones    HLD (hyperlipidemia)    HTN (hypertension)    Hypothyroidism    Kidney stones    Knee pain    Nausea and vomiting 10/15/2022   Non-diabetic pancreatic hormone dysfunction years   pt. 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   Pancreatitis    Pneumonia    Seizures (HCC)    caused by dye injected during a procedure   Shortness of breath    with exertion   Sinus problem    frequent infections/congestion   Stroke (HCC) 2021   reports having CVA in 2021 and having mini strokes before that   Thyroid disease    Past Surgical History:  Procedure Laterality Date   ABDOMINAL HYSTERECTOMY     APPENDECTOMY     CARPAL TUNNEL RELEASE  10+  years ago   bilateral   EYE SURGERY  3 yrs ago   bilateral cataracts   FOOT OSTEOTOMY  6 weeks ago   Left foot: great, 2nd & 3rd   FOOT OSTEOTOMY  5 years ago   Right great toe   HAND SURGERY Bilateral 2011-most recent   multiple hand surgeries, 2 on left, 3 on right   KNEE ARTHROPLASTY Right 04/28/2022   Procedure: COMPUTER ASSISTED TOTAL KNEE ARTHROPLASTY;  Surgeon: Donato Heinz, MD;  Location: [facility];  Service: Orthopedics;  Laterality: Right;   LOOP RECORDER INSERTION N/A 05/16/2020   Procedure: LOOP RECORDER INSERTION;  Surgeon: Marcina Millard, MD;  Location: [facility];  Service: Cardiovascular;  Laterality: N/A;   NASAL SINUS SURGERY  most recent 7-8 yrs ago   7 sinus surgeries    TRIGGER FINGER RELEASE  11/19/2011   Procedure: RELEASE TRIGGER FINGER/A-1 PULLEY;  Surgeon: Nicki Reaper, MD;  Location: [facility];  Service: Orthopedics;  Laterality: Right;  release a-1 pulley right index finger and cyst removal   WRIST GANGLION EXCISION  1980's   right  ONSET DATE: 11/23/22  REFERRING DIAG: TIA  THERAPY DIAG:  Muscle weakness (generalized)  Unsteadiness on feet  Difficulty in walking, not elsewhere classified  Other lack of coordination  Acute left-sided weakness  Rationale for Evaluation and Treatment: Rehabilitation  SUBJECTIVE:                                                                                                                                                                                             SUBJECTIVE STATEMENT:   Pt reports she is doing well. No knee pain. Mild fatigue from not sleeping last night.  Pt accompanied by: significant other  PERTINENT HISTORY: [name] is an 35 year-old female who was admitted to [facility] on 11/23/2022 with a cerebellar CVA. 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 Pt. Was admitted to Inpatient Rehab from 12/21-1/02/2023. 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  Patient underwent inpatient rehabilitation for 2 weeks.  Patient was assessed and was scheduled for a knee replacement on 08/29/2022 however had to cancel it due to having had a CVA. PMH includes: anemia, arthritis, back pain, depression, hernia, GERD, HLD, HTN, hypothyroidism, knee pain, pancreatic hormone dysfunction, pneumonia, seizures, stroke, thyroid disease.  Patient has a donjoy brace for L knee.   PAIN:  Are you having pain? no  PRECAUTIONS: Fall, has latex allergy  WEIGHT BEARING RESTRICTIONS: No  FALLS: Has patient fallen in last 6 months? Yes. Number of falls 4 falls   LIVING ENVIRONMENT: Lives with: lives with their family and lives with their spouse Lives in: House/apartment Stairs: Yes: Internal: flight steps;   and External: 2 steps; on right going up and on left going up Has following equipment at home: Single point cane, Walker - 2 wheeled, Shower bench, and bed side commode  PLOF: Independent  PATIENT GOALS: to have less pain and move better  OBJECTIVE:   TODAY'S TREATMENT: DATE: 11/26/23   TherEx: Standing at rail on airex pad x 3 min with 30 sec bouts of no UE support. Pt reports dizziness and nausea after 3 min requiring increased rest break and elevated BLE. States that s/s passed after 2 min rest.   Sit<>stand from amr chair 2 x 10 .   Standing on airex pad with RW:  No UE support x 15 sec.  Narrow BOS 2 x 30 sec with seated rest break between bouts  Normal BOS with forward reach x 10 bil  Normal BOS with lateral reach x 10 bil  Normal BOS with BUE overhead reach x 10   Gait with RW x 50 with  supervision assist. And min cues for AD management with turns to prepare to sit in   CGA for safety on and off airex pad throughout session as well as with sit<>stand and stand pivot transfers with cues for RUE position.  PATIENT EDUCATION: Education details: Pt educated throughout  session about proper posture and technique with exercises. Improved exercise technique, movement at target joints, use of target muscles after min to mod verbal, visual, tactile cues.   HOME EXERCISE PROGRAM:  Access Code: [email or web address] URL: [email or web address] Date: 10/07/2023 Prepared by: Grier Rocher  Exercises - Standing March with Counter Support  - 1 x daily - 3 x weekly - 2 sets - 10 reps - Standing Hip Abduction with Unilateral Counter Support  - 1 x daily - 3 x weekly - 2 sets - 10 reps - 2 hold - Standing Hip Extension with Unilateral Counter Support  - 1 x daily - 3 x weekly - 2 sets - 10 reps - 2 hold - Supine Straight Leg Raises  - 1 x daily - 37 x weekly - 2 sets - 10 reps - Bridge  - 1 x daily - 3 x weekly - 2 sets - 10 reps - 2 hold - Supine Heel Slide  - 1 x daily - 3 x weekly - 2 sets - 10 reps  GOALS: Goals reviewed with patient? Yes  SHORT TERM GOALS: Target date: 09/19/23  Patient will be independent in starter HEP to improve A/ROM tolerance and preserve available ROM of LLE and to advance A/ROM and strength of RLE.  Baseline: 08/20/23: defer to vists 2-3  Goal status: NEW  LONG TERM GOALS: Target date: 10/20/23  Patient will increase FOTO score to equal to or greater than 65%  to demonstrate statistically significant improvement in ease of mobility.  Baseline:  47%; 06/23/23: 60;  7/16: 60%  10/31: 54 11/19/23: 56 Goal status: PROGRESSING   2.  Patient (> 44 years old) will complete five times sit to stand test in < 15 seconds indicating an increased RLE power.  Baseline: 5/7: unable to tolerate 7/16: 17.4 sec 10/31: 11.02 sec 11/19/23: 13.06 sec Goal status: PROGRESSING   3.   Patient to report tolerance of ad lib AMB in home during the day of distances >29ft >5x at supervision level or better to improve independence with ADL.    Baseline: 08/20/23: using transport chair for nearly all mobility, not tolerating walking due to WB limitations of  LLE  10/31: able to ambulate 30-65ft with CGA from PT.  11/19/23: Pt able to ambulate 255.3' with CGA, FWW, and chair follow Goal status: PROGRESSING      ASSESSMENT:  CLINICAL IMPRESSION:    PT treatment continued to focus on balance training to improve safety and independence in home. One instance of dizziness and nausea in standing after 3 min, but nausea passed and no additional vertigo/ orthostatic s/s. Tolerated balance training well on unlevel surface with improving use of ankle strategy to lateral and posterior LOB. Pt will continue to benefit from skilled therapy to address remaining deficits in order to improve overall QoL and return to PLOF.     OBJECTIVE IMPAIRMENTS: Abnormal gait, cardiopulmonary status limiting activity, decreased activity tolerance, decreased balance, decreased cognition, decreased endurance, decreased knowledge of use of DME, decreased mobility, difficulty walking, decreased ROM, decreased strength, decreased safety awareness, hypomobility, increased fascial restrictions, impaired perceived functional ability, impaired flexibility, impaired UE functional use, improper body mechanics, postural dysfunction, and pain.   ACTIVITY LIMITATIONS:  carrying, lifting, bending, sitting, standing, squatting, stairs, transfers, bed mobility, bathing, toileting, dressing, reach over head, hygiene/grooming, locomotion level, and caring for others  PARTICIPATION LIMITATIONS: meal prep, cleaning, laundry, medication management, personal finances, interpersonal relationship, driving, shopping, community activity, yard work, school, and church  PERSONAL FACTORS: Age, Behavior pattern, Education, Fitness, Past/current experiences, Time since onset of injury/illness/exacerbation, and 3+ comorbidities: anemia, arthritis, back pain, depression, hernia, GERD, HLD, HTN, hypothyroidism, knee pain, pancreatic hormone dysfunction, pneumonia, seizures, stroke, thyroid disease  are also  affecting patient's functional outcome.   REHAB POTENTIAL: Fair    CLINICAL DECISION MAKING: Evolving/moderate complexity  EVALUATION COMPLEXITY: Moderate  PLAN:  PT FREQUENCY: 1-2x/week  PT DURATION: 10 weeks  PLANNED INTERVENTIONS: Therapeutic exercises, Therapeutic activity, Neuromuscular re-education, Balance training, Gait training, Patient/Family education, Self Care, Joint mobilization, Joint manipulation, Stair training, Vestibular training, Canalith repositioning, Visual/preceptual remediation/compensation, Orthotic/Fit training, DME instructions, Electrical stimulation, Wheelchair mobility training, Spinal mobilization, Cryotherapy, Moist heat, Compression bandaging, scar mobilization, Splintting, Taping, Traction, Ultrasound, Manual therapy, and Re-evaluation   PLAN FOR NEXT SESSION:  Tolerance to standing in functional movement/ tasks.  Continue BLE strengthening in weighted and unweighted position   Grier Rocher PT, DPT  Physical Therapist - [facility]  [facility]  5:37 PM 11/26/23

## 2023-12-01 ENCOUNTER — Ambulatory Visit: Payer: Medicare HMO

## 2023-12-01 ENCOUNTER — Ambulatory Visit: Payer: Medicare HMO | Admitting: Physical Therapy

## 2023-12-03 ENCOUNTER — Ambulatory Visit: Payer: Medicare HMO | Admitting: Occupational Therapy

## 2023-12-08 ENCOUNTER — Ambulatory Visit: Payer: Medicare HMO | Admitting: Occupational Therapy

## 2023-12-08 ENCOUNTER — Ambulatory Visit: Payer: Medicare HMO | Admitting: Physical Therapy

## 2023-12-09 DIAGNOSIS — D509 Iron deficiency anemia, unspecified: Secondary | ICD-10-CM

## 2023-12-09 DIAGNOSIS — D75839 Thrombocytosis, unspecified: Secondary | ICD-10-CM

## 2023-12-09 HISTORY — DX: Iron deficiency anemia, unspecified: D50.9

## 2023-12-09 HISTORY — DX: Thrombocytosis, unspecified: D75.839

## 2023-12-10 ENCOUNTER — Ambulatory Visit: Payer: Medicare HMO | Attending: Physical Medicine and Rehabilitation | Admitting: Occupational Therapy

## 2023-12-10 ENCOUNTER — Ambulatory Visit: Payer: Medicare HMO

## 2023-12-10 DIAGNOSIS — R531 Weakness: Secondary | ICD-10-CM | POA: Diagnosis not present

## 2023-12-10 DIAGNOSIS — R42 Dizziness and giddiness: Secondary | ICD-10-CM | POA: Insufficient documentation

## 2023-12-10 DIAGNOSIS — R278 Other lack of coordination: Secondary | ICD-10-CM | POA: Diagnosis not present

## 2023-12-10 DIAGNOSIS — M6281 Muscle weakness (generalized): Secondary | ICD-10-CM | POA: Diagnosis not present

## 2023-12-10 DIAGNOSIS — R262 Difficulty in walking, not elsewhere classified: Secondary | ICD-10-CM | POA: Insufficient documentation

## 2023-12-10 DIAGNOSIS — R2681 Unsteadiness on feet: Secondary | ICD-10-CM | POA: Insufficient documentation

## 2023-12-10 DIAGNOSIS — I6381 Other cerebral infarction due to occlusion or stenosis of small artery: Secondary | ICD-10-CM | POA: Insufficient documentation

## 2023-12-10 NOTE — Therapy (Signed)
 OUTPATIENT PHYSICAL THERAPY TREATMENT    Patient Name: [name] MRN: [id number] DOB:03-Nov-1941, 83 y.o., female Today's Date: 12/10/2023   PCP: Auston Purchase, D MD REFERRING PROVIDER: Maurice Sharlet RAMAN PA   END OF SESSION:  PT End of Session - 12/10/23 1657     Visit Number 23    Number of Visits 31    Date for PT Re-Evaluation 12/24/23    Authorization Type [employer] Medicare:    Authorization Time Period approved for 24 additional tx from 09/28/23 to 12/24/23; total visit count 31    Progress Note Due on Visit 30    PT Start Time 1615    PT Stop Time 1655    PT Time Calculation (min) 40 min    Equipment Utilized During Treatment Gait belt    Activity Tolerance Patient tolerated treatment well    Behavior During Therapy WFL for tasks assessed/performed                  Past Medical History:  Diagnosis Date   Anemia    Arthritis    Asthma    uses inhaler just prior to surgery to avoid attack   Back pain    from previous injury   Complication of anesthesia    has woken  up during 2 different surgery   Depression    no current issue/treatment; situation   Gallstones    GERD (gastroesophageal reflux disease)    Hiatal hernia    patient does NOT have nerve/muscle disease   History of kidney stones    HLD (hyperlipidemia)    HTN (hypertension)    Hypothyroidism    Kidney stones    Knee pain    Nausea and vomiting 10/15/2022   Non-diabetic pancreatic hormone dysfunction years   pt. 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   Pancreatitis    Pneumonia    Seizures (HCC)    caused by dye injected during a procedure   Shortness of breath    with exertion   Sinus problem    frequent infections/congestion   Stroke (HCC) 2021   reports having CVA in 2021 and having mini strokes before that   Thyroid  disease    Past Surgical History:  Procedure Laterality Date   ABDOMINAL HYSTERECTOMY     APPENDECTOMY     CARPAL TUNNEL RELEASE  10+ years ago    bilateral   EYE SURGERY  3 yrs ago   bilateral cataracts   FOOT OSTEOTOMY  6 weeks ago   Left foot: great, 2nd & 3rd   FOOT OSTEOTOMY  5 years ago   Right great toe   HAND SURGERY Bilateral 2011-most recent   multiple hand surgeries, 2 on left, 3 on right   KNEE ARTHROPLASTY Right 04/28/2022   Procedure: COMPUTER ASSISTED TOTAL KNEE ARTHROPLASTY;  Surgeon: Mardee Lynwood SQUIBB, MD;  Location: [facility];  Service: Orthopedics;  Laterality: Right;   LOOP RECORDER INSERTION N/A 05/16/2020   Procedure: LOOP RECORDER INSERTION;  Surgeon: Ammon Blunt, MD;  Location: [facility];  Service: Cardiovascular;  Laterality: N/A;   NASAL SINUS SURGERY  most recent 7-8 yrs ago   7 sinus surgeries    TRIGGER FINGER RELEASE  11/19/2011   Procedure: RELEASE TRIGGER FINGER/A-1 PULLEY;  Surgeon: Arley JONELLE Curia, MD;  Location: [facility];  Service: Orthopedics;  Laterality: Right;  release a-1 pulley right index finger and cyst removal   WRIST GANGLION EXCISION  1980's   right  ONSET DATE: 11/23/22  REFERRING DIAG: TIA  THERAPY DIAG:  Dizziness and giddiness  Unsteadiness on feet  Muscle weakness (generalized)  Rationale for Evaluation and Treatment: Rehabilitation  SUBJECTIVE:                                                                                                                                                                                             SUBJECTIVE STATEMENT:  Pt reports no pain and that she is feeling fine currently. She reports her dizziness has been improving, but still present. Would like to try vestibular screen. She reports if she is dizzy cannot lie down on left side, has to turn onto R side to get dizziness to go away. Pt also describes situations of lightheadedness where she sits>stand after taking a warm shower.   Pt accompanied by: significant other  PERTINENT HISTORY: [name] is an 83 year-old female who was admitted to [facility] on  11/23/2022 with a cerebellar CVA. 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 Pt. Was admitted to Inpatient Rehab from 12/21-1/02/2023. 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  Patient underwent inpatient rehabilitation for 2 weeks.  Patient was assessed and was scheduled for a knee replacement on 08/29/2022 however had to cancel it due to having had a CVA. PMH includes: anemia, arthritis, back pain, depression, hernia, GERD, HLD, HTN, hypothyroidism, knee pain, pancreatic hormone dysfunction, pneumonia, seizures, stroke, thyroid  disease.  Patient has a donjoy brace for L knee.   PAIN:  Are you having pain? no  PRECAUTIONS: Fall, has latex allergy  WEIGHT BEARING RESTRICTIONS: No  FALLS: Has patient fallen in last 6 months? Yes. Number of falls 4 falls   LIVING ENVIRONMENT: Lives with: lives with their family and lives with their spouse Lives in: House/apartment Stairs: Yes: Internal: flight steps;   and External: 2 steps; on right going up and on left going up Has following equipment at home: Single point cane, Walker - 2 wheeled, Shower bench, and bed side commode  PLOF: Independent  PATIENT GOALS: to have less pain and move better  OBJECTIVE:   TODAY'S TREATMENT: DATE: 12/10/23 Gait belt donned throughout for pt safety. PT provided CGA-min a throughout session.   NMR:  VESTIBULAR SCREEN AROM screen for vestibular screen: pt exhibits sufficient pain-free cervical rotation bilat and cervical ext necessary for testing.  Saccades: hypometric with catch-up saccade consistently to R, pt reports testing makes her dizzy Smooth pursuits: mildly saccadic Ocular ROM: WNL Gaze-holding nystagmus: absent Spontaneous nystagmus: absent End-gaze nystagmus: absent   CRM:   Left Dix-Hallpike: no nystagmus observed, but  pt describes vertigo that lasts >1 min. Some nausea reported L Epley 1x Repeat Left  Dix-Hallpike: no nystagmus observed, pt describes vertigo but lasts <30 sec, feels less nauseated with test as well L Epley 1x - pt does report feeling somewhat better following maneuver Comments: Testing and tx performed on tilt table to promote comfort with testing. Initially used a pillow but removed as pt reported cervical discomfort. She reported neck feeling OK without pillow.  PT reviewed post-maneuver precautions, take it easy for rest of day, should know if maneuvers were helpful by the time pt goes to lay down at night/get up in the morning. PT did recommend that further vestibular rehab would likely be beneficial. Pt and spouse verbalized understanding.     PATIENT EDUCATION: Education details: Pt educated throughout session about proper posture and technique with exercises. Improved exercise technique, movement at target joints, use of target muscles after min to mod verbal, visual, tactile cues.   HOME EXERCISE PROGRAM:  Access Code: [email or web address] URL: [email or web address] Date: 10/07/2023 Prepared by: Massie Dollar  Exercises - Standing March with Counter Support  - 1 x daily - 3 x weekly - 2 sets - 10 reps - Standing Hip Abduction with Unilateral Counter Support  - 1 x daily - 3 x weekly - 2 sets - 10 reps - 2 hold - Standing Hip Extension with Unilateral Counter Support  - 1 x daily - 3 x weekly - 2 sets - 10 reps - 2 hold - Supine Straight Leg Raises  - 1 x daily - 37 x weekly - 2 sets - 10 reps - Bridge  - 1 x daily - 3 x weekly - 2 sets - 10 reps - 2 hold - Supine Heel Slide  - 1 x daily - 3 x weekly - 2 sets - 10 reps  GOALS: Goals reviewed with patient? Yes  SHORT TERM GOALS: Target date: 09/19/23  Patient will be independent in starter HEP to improve A/ROM tolerance and preserve available ROM of LLE and to advance A/ROM and strength of RLE.  Baseline: 08/20/23: defer to vists 2-3  Goal status: NEW  LONG TERM GOALS: Target date: 10/20/23  Patient  will increase FOTO score to equal to or greater than 65%  to demonstrate statistically significant improvement in ease of mobility.  Baseline:  47%; 06/23/23: 60;  7/16: 60%  10/31: 54 11/19/23: 56 Goal status: PROGRESSING   2.  Patient (> 60 years old) will complete five times sit to stand test in < 15 seconds indicating an increased RLE power.  Baseline: 5/7: unable to tolerate 7/16: 17.4 sec 10/31: 11.02 sec 11/19/23: 13.06 sec Goal status: PROGRESSING   3.   Patient to report tolerance of ad lib AMB in home during the day of distances >27ft >5x at supervision level or better to improve independence with ADL.    Baseline: 08/20/23: using transport chair for nearly all mobility, not tolerating walking due to WB limitations of LLE  10/31: able to ambulate 30-62ft with CGA from PT.  11/19/23: Pt able to ambulate 255.3' with CGA, FWW, and chair follow Goal status: PROGRESSING      ASSESSMENT:  CLINICAL IMPRESSION:   Initiated vestibular assessment/screen. Pt with central indicators present, but also with reported vertigo with Dix-Hallpike testing of L side. No nystagmus was observed, but testing performed in room light. Pt would benefit from further vestibular assessment future visit and repeat Dix-Hallpike testing with fixation suppressed/vestibular goggles donned. Epley provided 2x to L side  and pt did report improvement with second maneuver. PT reviewed post-maneuver precautions (see above). Pt will continue to benefit from skilled therapy to address remaining deficits in order to improve overall QoL and return to PLOF.     OBJECTIVE IMPAIRMENTS: Abnormal gait, cardiopulmonary status limiting activity, decreased activity tolerance, decreased balance, decreased cognition, decreased endurance, decreased knowledge of use of DME, decreased mobility, difficulty walking, decreased ROM, decreased strength, decreased safety awareness, hypomobility, increased fascial restrictions, impaired  perceived functional ability, impaired flexibility, impaired UE functional use, improper body mechanics, postural dysfunction, and pain.   ACTIVITY LIMITATIONS: carrying, lifting, bending, sitting, standing, squatting, stairs, transfers, bed mobility, bathing, toileting, dressing, reach over head, hygiene/grooming, locomotion level, and caring for others  PARTICIPATION LIMITATIONS: meal prep, cleaning, laundry, medication management, personal finances, interpersonal relationship, driving, shopping, community activity, yard work, school, and church  PERSONAL FACTORS: Age, Behavior pattern, Education, Fitness, Past/current experiences, Time since onset of injury/illness/exacerbation, and 3+ comorbidities: anemia, arthritis, back pain, depression, hernia, GERD, HLD, HTN, hypothyroidism, knee pain, pancreatic hormone dysfunction, pneumonia, seizures, stroke, thyroid  disease  are also affecting patient's functional outcome.   REHAB POTENTIAL: Fair    CLINICAL DECISION MAKING: Evolving/moderate complexity  EVALUATION COMPLEXITY: Moderate  PLAN:  PT FREQUENCY: 1-2x/week  PT DURATION: 10 weeks  PLANNED INTERVENTIONS: Therapeutic exercises, Therapeutic activity, Neuromuscular re-education, Balance training, Gait training, Patient/Family education, Self Care, Joint mobilization, Joint manipulation, Stair training, Vestibular training, Canalith repositioning, Visual/preceptual remediation/compensation, Orthotic/Fit training, DME instructions, Electrical stimulation, Wheelchair mobility training, Spinal mobilization, Cryotherapy, Moist heat, Compression bandaging, scar mobilization, Splintting, Taping, Traction, Ultrasound, Manual therapy, and Re-evaluation   PLAN FOR NEXT SESSION:  Tolerance to standing in functional movement/ tasks.  Continue BLE strengthening in weighted and unweighted position Further vestibular assessment  Darryle Patten PT, DPT  Physical Therapist - [facility]  [facility]  5:08 PM 12/10/23

## 2023-12-10 NOTE — Therapy (Signed)
 Occupational Therapy Neuro Treatment Note    Patient Name: [name] MRN: [id number] DOB:1941-07-04, 83 y.o., female Today's Date: 12/10/2023  PCP: Dr. Auston REFERRING PROVIDER:  Dr. Auston  END OF SESSION:  PLAN:   OT End of Session - 12/10/23 1625     Visit Number [id number]    Number of Visits 96    Date for OT Re-Evaluation 01/21/24    Authorization Time Period Progress report period starting 12/16/2022    OT Start Time 1530    OT Stop Time 1615    OT Time Calculation (min) 45 min    Activity Tolerance Patient tolerated treatment well    Behavior During Therapy WFL for tasks assessed/performed                   Past Medical History:  Diagnosis Date   Anemia    Arthritis    Asthma    uses inhaler just prior to surgery to avoid attack   Back pain    from previous injury   Complication of anesthesia    has woken  up during 2 different surgery   Depression    no current issue/treatment; situation   Gallstones    GERD (gastroesophageal reflux disease)    Hiatal hernia    patient does NOT have nerve/muscle disease   History of kidney stones    HLD (hyperlipidemia)    HTN (hypertension)    Hypothyroidism    Kidney stones    Knee pain    Nausea and vomiting 10/15/2022   Non-diabetic pancreatic hormone dysfunction years   pt. 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   Pancreatitis    Pneumonia    Seizures (HCC)    caused by dye injected during a procedure   Shortness of breath    with exertion   Sinus problem    frequent infections/congestion   Stroke (HCC) 2021   reports having CVA in 2021 and having mini strokes before that   Thyroid  disease    Past Surgical History:  Procedure Laterality Date   ABDOMINAL HYSTERECTOMY     APPENDECTOMY     CARPAL TUNNEL RELEASE  10+ years ago   bilateral   EYE SURGERY  3 yrs ago   bilateral cataracts   FOOT OSTEOTOMY  6 weeks ago   Left foot: great, 2nd & 3rd   FOOT OSTEOTOMY  5 years ago   Right  great toe   HAND SURGERY Bilateral 2011-most recent   multiple hand surgeries, 2 on left, 3 on right   KNEE ARTHROPLASTY Right 04/28/2022   Procedure: COMPUTER ASSISTED TOTAL KNEE ARTHROPLASTY;  Surgeon: Mardee Lynwood SQUIBB, MD;  Location: [facility];  Service: Orthopedics;  Laterality: Right;   LOOP RECORDER INSERTION N/A 05/16/2020   Procedure: LOOP RECORDER INSERTION;  Surgeon: Ammon Blunt, MD;  Location: [facility];  Service: Cardiovascular;  Laterality: N/A;   NASAL SINUS SURGERY  most recent 7-8 yrs ago   7 sinus surgeries    TRIGGER FINGER RELEASE  11/19/2011   Procedure: RELEASE TRIGGER FINGER/A-1 PULLEY;  Surgeon: Arley JONELLE Curia, MD;  Location: [facility];  Service: Orthopedics;  Laterality: Right;  release a-1 pulley right index finger and cyst removal   WRIST GANGLION EXCISION  1980's   right   Patient Active Problem List   Diagnosis Date Noted   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 11/27/2022   Hypomagnesemia 11/27/2022   Occlusion  of right vertebral artery 11/23/2022   HLD (hyperlipidemia) 11/23/2022   Asthma 11/23/2022   Depression with anxiety 11/23/2022   Chronic diastolic CHF (congestive heart failure) (HCC) 11/23/2022   Normocytic anemia 11/23/2022   Aspiration pneumonia (HCC) 11/23/2022   AKI (acute kidney injury) (HCC) 11/23/2022   Abdominal pain 11/23/2022   Mesenteric mass 11/23/2022   Coffee ground emesis 11/23/2022   Nausea and vomiting 10/15/2022   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   Fatigue 09/17/2022   Left hemiparesis (HCC) 09/17/2022   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HCC) 08/22/2022   GERD (gastroesophageal reflux disease) 08/21/2022   Agitation 08/20/2022   Acute left-sided weakness 08/20/2022   Expressive aphasia    Stroke (HCC) 08/19/2022   Leukocytosis 08/19/2022   History of urticaria 04/28/2022   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   Primary osteoarthritis of left knee 02/24/2022   Primary  osteoarthritis of right knee 02/24/2022   Lumbar spondylolysis 04/12/2020   History of CVA (cerebrovascular accident) 03/26/2020   Low back pain radiating to right lower extremity 03/21/2020   B12 deficiency 03/06/2020   Positive anti-CCP test 12/21/2019   Arthralgia 12/13/2019   Dermatitis 12/13/2019   Rheumatoid factor positive 12/13/2019   Essential hypertension 12/11/2018   Palpitations 12/11/2018   Acquired hypothyroidism 11/10/2018   Arthritis of knee 09/17/2016   Anxiety 11/22/2014   Asthma without status asthmaticus 11/22/2014   Benign neoplasm of colon, unspecified 11/22/2014   Environmental allergies 11/22/2014   Hypertriglyceridemia 11/22/2014   Hypokalemia 11/22/2014   Personal history of disease of skin and subcutaneous tissue 11/22/2014   REFERRING DIAG: CVA   THERAPY DIAG:  Muscle weakness (generalized)   Other lack of coordination   Rationale for Evaluation and Treatment Rehabilitation   SUBJECTIVE:   SUBJECTIVE STATEMENT:  Pt. reports  having had a nice visit with her son before Christmas.  Pt accompanied by: significant other   PERTINENT HISTORY: Patient is an 83 year-old female who was admitted to [facility] on 11/23/2022 with a cerebellar CVA. 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 Pt. Was admitted to Inpatient Rehab from 12/21-1/02/2023. 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  Patient underwent inpatient rehabilitation for 2 weeks.  Patient was assessed and was scheduled for a knee replacement on 08/29/2022 however had to cancel it due to having had a CVA.  Patient had a recent fall 2 days after discharging from inpatient rehab. Past Medical History includes: Knee replacement, essential HTN, hypokalemia, leukocytosis, seizures, positive anti-- CCP test, anxiety disorder, mini strokes.  Patient had shingles with left eye nerve pain s/p 1 year ago.    PRECAUTIONS: Fall    WEIGHT BEARING RESTRICTIONS No   PAIN:  Are you having pain?  No pain.   FALLS: Has patient fallen in last 6 months? Yes. Number of falls 1   LIVING ENVIRONMENT: Lives with: Lives with Spouse Lives in: House/apartment Stairs: 2 storey home, resides on the first floor.  External: 2 stairs front no rails, and 6 in back with rails Has following equipment at home: Single point cane, Walker - 2 wheeled, Environmental Consultant - 4 wheeled, Shower bench, and bed side commode   PLOF: Independent   PATIENT GOALS  To Regain the use of her left arm   OBJECTIVE:    HAND DOMINANCE: Right   ADLs: Overall ADLs: Husband assists pt. as needed Transfers/ambulation related to ADLs:Pt. Uses a 3 wheeled walker with Husband assist. Eating: Pt. Is independent with the right hand.  Pt. has difficulty cutting food. Grooming: Pt. is using her right hand, however has difficulty sustaining her LUE in elevation to assist with haircare. UB Dressing: Pt. Is independent donning a pullover shirt, and button down shirt. Has difficulty with buttoning, LB Dressing:  Independent donning pants, and socks. Difficulty tying shoes. Toileting: Independent Bathing: Pt. Is able to engage her right hand. Tub Shower transfers: Supervision Equipment: See above for equipment    IADLs: Shopping:  Has not had the opportunity for grocery shopping yet Light housekeeping: Husband is assisting with light house keeping Meal Prep:  Dependent Community mobility: Relies of family/friends Medication management: Husband assisting with weekly pillbox set-up, and administering medication. Financial management: TBD Handwriting: 75% legible   MOBILITY STATUS: Hx of falls   POSTURE COMMENTS:  No Significant postural limitations Sitting balance: supported sitting balance WFL   ACTIVITY TOLERANCE: Activity tolerance:  Fatigues in greater than 30 min.    FUNCTIONAL OUTCOME MEASURES: FOTO: 57   UPPER EXTREMITY ROM      Active ROM Right Eval:  WFL Left eval Left  01/22/23 Left  03/03/23 Left  04/14/23 Left  05/28/2023 Left 07/23/2023 Left 08/20/2023 Left 09/17/23 Left 10/29/23  Shoulder flexion   132 100 108 108 108 60(70) 70(78) 70(102) 82(110)  Shoulder abduction   80 85 85 85 85 65(78) 72(85) 73(94) 75(98)  Shoulder adduction              Shoulder extension              Shoulder internal rotation              Shoulder external rotation              Elbow flexion   140 140 WFL [facility] WFL  Elbow extension   WNL WNL [facility] WFL WFL  Wrist flexion   65 68         Wrist extension   -10 20 24  32 32 -10 20 20 26   Wrist ulnar deviation     12 10 14 14 10 18 18 20   Wrist radial deviation     8 14 14 16 12 14 14 14   Wrist pronation              Wrist supination              (Blank rows = not tested)   Left digit flexion to [facility]: 2nd: 0cm, 3rd: 0cm, 4th: 0cm, 5th: 0cm   Limited Left full 2nd digit extension     UPPER EXTREMITY MMT:      MMT Right Eval: 4+/5 overall Right 07/23/2023 Left Eval Left 01/22/23 Left 03/03/23 Left  04/14/2023 Left 05/28/2023 Left 07/23/2023 Left 08/20/2023 Left 09/17/23 Left  10/29/23  Shoulder flexion   3+/5 3/5 3-/5 3-/5 3-/5 3-/5 2/5 2+/5 2+/5 3-/5  Shoulder abduction   3+/5 3-/5 3-/5 3-/5 3-/5 3-/5 2/5 2+/5 2+/5 3-/5  Shoulder adduction               Shoulder extension               Shoulder internal rotation               Shoulder external rotation               Middle trapezius               Lower trapezius  Elbow flexion   4/5 3+/5 4/5 N/A 4/5 4/5 3+/5 3+/5 3+/5 3+/5  Elbow extension   4/5 3+/5 4/4 N/A 4/5 4/5 3+/5 3+/5 4-/5 4-/5  Wrist flexion               Wrist extension   4/5 2-/5 3-/5 3-/5 3/5 3+/5 3-/5 3+/5 3+/5 3+/5  Wrist ulnar deviation               Wrist radial deviation               Wrist pronation               Wrist supination               (Blank rows = not tested)   HAND FUNCTION: Grip strength: Right: 26#, Left: 10# Pinch  strength: Right 8#, Left: 3#, 3 Pt. Pinch strength: Right: 9#, L: 2#  01/22/2023 Grip strength: Right: 26#, Left: 12# Pinch strength:  Pinch meter used at the initial eval has been sent out for recalibration   03/03/2023 Grip strength: Right: 26#, Left: 13# Pinch strength:  Pinch meter used at the initial eval has been sent out for recalibration  04/14/2023: Grip strength: Right: 26#, Left: 13# Pinch strength:   Right 8#, Left: 4#, 3 Pt. Pinch strength: Right: 9#, L: 3#    05/28/2023: Grip strength: Right: 26#, Left: 13# Pinch strength:   Right 8#, Left: 2#, 3 Pt. Pinch strength: Right: 9#, L: 4#  07/23/2023  Grip strength: Right: 5#, Left: 2# Pinch strength:   Right 6#, Left: 2#, 3 Pt. Pinch strength: Right: 5#, L: 2#  08/20/2023:    Grip strength: Right: 8#, Left: 4# Pinch strength:   Right 6#, Left: 2#, 3 Pt. Pinch strength: Right: 5#, L: 2#    09/17/23:  Grip strength: Right: 5#, Left: 4# Pinch strength:   Right 6#, Left: 2#, 3 Pt. Pinch strength: Right: 5#, L: 2#  10/28/2024  Grip strength: Right: 13#, Left: 6# Pinch strength:   Right 6#, Left: 2#, 3 Pt. Pinch strength: Right: 5#, L: 2#     COORDINATION: Right: 22 sec., Left: <5 min. To place 7 pegs with increased compensation proximally in the trunk, and through reflexive associated reactions.  01/22/23 Right: 22 sec., Left: 3 min. & 4 sec.  03/03/23 Right: 22 sec., Left: 1 min. & 39 sec.  04/14/23   TBD  05/28/23 Right: 22 sec., Left:  5 min.  07/23/2023  Right: 33 sec., Left:  Pt. Is unable to grasp, and place pegs into the pegboard. Pt. Was able to remove 9 vertical pegs in 34 sec.  08/20/2023:  Right: 27 sec., Left:  5 pegs placed in 5 min.; Pt. Was able to remove 9 vertical pegs in 28 sec.  09/17/23:  Right: 26 sec., Left:  3 min, & 49 sec.    10/28/2024:  Right: 26 sec., Left:  2 min, & 10 sec.        SENSATION: Light touch: WFL, proprioceptive awareness: Intact   EDEMA: N/A   MUSCLE TONE:  LUE: Hypotonic   COGNITION: Overall cognitive status: WFL for tasks assessed. Pt. Is impulsive at times.   VISION: Subjective report: Pt. report having shingles affecting left eye  s/p 1 year. Has nerve pain Baseline vision: Wears glasses for reading only Visual history:  updated see clinical impression   VISION ASSESSMENT:    WFL for tasks performed   PERCEPTION: Intact   PRAXIS: Impaired: Motor planning  OBSERVATIONS:  Pt. more alert, and engaging since prior to the most recent hospitalization.    TODAY'S TREATMENT:    There. Ex.:   Pt. tolerated PROM/AAROM to the Left shoulder. Pt. worked on [employer], and reciprocal motion using the UBE while seated for 10 min. with no resistance. Constant monitoring was provided. Pt. Worked on pinch strengthening in the left hand for lateral, and 3pt. pinch using yellow, and red resistive clips. Pt. worked on placing the clips at various vertical and horizontal angles. Tactile and verbal cues were required for eliciting the desired movement.    Neuromuscular re-education:   Pt. worked on grasping one inch resistive cubes alternating thumb opposition to the tip of the 2nd digit. The board was positioned at a vertical angle. Pt. worked on pressing them back into place while isolating the 2nd digit. Pt. worked on using her left hand to grasp the thin 1 Jamar pegs. Pt. worked on sustaining a lateral grasp on the resistive tweezers to remove them from the board.       PATIENT EDUCATION: Education details: LUE therapeutic exercise Person educated: Patient and Spouse Education method: Medical Illustrator Education comprehension: verbalized understanding, returned demonstration, and needs further education   HOME EXERCISE PROGRAM:   Reviewed activities at home to promote isolated 2nd digit extension.    GOALS: Goals reviewed with patient? Yes   SHORT TERM GOALS: Target date: 12/10/2023   1. Patient will be independent with  home exercise program for the left upper extremity Baseline: 10/29/2023: Independent 09/17/23: Independent 08/20/23: Pt. continues to require assist from he husband for HEPs 07/23/2023: Pt. continues to require assist. 05/28/2023: Pt. Requires assist 04/09/2023: Pt. Continues to consistently attempt to engage her hand at hand 02/01/2023: Pt. Consistently attempts to perform HEPs independently. No current home exercise program Goal status: Ongoing   LONG TERM GOALS: Target date:  01/21/2024  1.  Patient will improve left shoulder strength by 2 mm grades to be able to sustain UE's in elevation long enough to wash her hair.  Baseline: 10/29/2023: Left shoulder flexion 3-/5, abduction: 3-/5 10/27/2023:  Pt. Is improving with reaching up to reach her hair for hair care. Pt. Continues to have difficulty sustaining the UE in elevation long enough to perform hair care. 09/17/2023: Left shoulder 2+/5, abduction 2+/5 08/20/2023: Left shoulder flexion 2+/5, abduction 2+/5  07/23/2023: Left shoulder flexion: 2/5, abduction: 2/5. 05/28/2023: Left shoulder flexion: 3-/5, abduction: 3-/5. Pt. has an old left shoulder injury limiting progression with left shoulder strength 05/26/2023: Pt. continues to present with limitiations in sustaining LUE elevation long enough to perform hair care thoroughly 04/09/2023: Pt. is limited with sustaining LUE elevation long enough to perform hair care thoroughly. 03/03/2023: Left shoulder flexion: 3-/5, abduction: 3-/5 01/22/2023: Left shoulder flexion: 3-/5, abduction: 3-/5 Eval: Left shoulder flexion: 3/5, abduction: 3-/5 Goal status: Ongoing   2.  Patient will improve left shoulder active abduction to be able to comb her hair Baseline: 10/29/2023: Left shoulder abduction: 75(98) 10/27/2023: Pt. Is improving with reaching the left side of her hair for haircare. Pt. Has difficulty reaching with the left side to the back, and right side of her head.09/17/2023: 73(94) Pt. is able to reach her left  side, and back of hair. 9/11/09/2023: 72(85) Pt. is able to reach her left side, and back of hair. 07/23/2023: Left shoulder flexion: 60(70) abduction: 65(78) 05/28/2023: left shoulder flexion: 108 abduction: 85 Pt. Has an old left shoulder injury limiting progression with left shoulder ROM 05/26/2023:  5/10 left shoulder pain with ROM limits using it functionally during hair care. 04/09/2023: Pt. Presents with difficulty abducting her left shoulder enough to thoroughly complete haircare. 03/03/2023: Shoulder abduction: 85 01/22/2023: Shoulder abduction: 85 Eval: Left shoulder abduction is 80(108) Goal status: Ongoing   3.  Patient will independently button her shirt with modified independence. Baseline:10/29/2023: Pt. Has difficulty manipulating buttons, will benefit from a buttonhook.10/27/23: Pt. Requires increased time to complete buttons.09/17/2023: Pt. Has a buttonhook, however does not know where it is. Pt. requires increased time to complete, and assist from her husband. 08/20/2023: Pt. requires increased time to complete, and assist from her husband. 07/23/2023 Pt. presents with difficulty buttoning her shirt. 05/28/2023: pt. Continues to work towards progressing with buttoning 05/26/2023:  Pt. Continues to progress towards buttoning. 04/09/2023: Pt. Continues to progress towards buttoning. 03/03/2023: Pt. Continues to have difficulty with buttoning. 01/22/2023: Pt. continues to have difficulty. Eval: Patient has difficulty.  Goal status: Ongoing   4.  Patient well improve left grip strength in preparatio for securely hold a glass/beverage Baseline: 10/29/2023: Grip strength: Right: 13#  Left: 6#10/27/2023: Pt. Has difficulty securely holding heavier items with larger diameter. 09/17/2023: Grip strength: Right: 5#, Left: 4# Pt. Has difficulty holding a glass. Right: 8#, Left: 4# Pt. Is able to more securely hold mirrors, and flowers with the left hand, however has difficulty holding a glass beverage, or bottle.  08/20/2023: Right: 8#, Left: 4# Pt. Is able to more securely hold mirrors, and flowers with the left hand, however has difficulty holding a glass beverage, or bottle. 07/23/2023: Grip strength: Right: 5#, Left: 2# 05/28/2023: Right: 26#, Left: 13#  pt. Presents with difficulty securely holding flowers in her left hand. 05/26/2023: TBD 04/09/2023: Pt. Is able to hold, and hike pants with the left hand, continues to have difficulty with securely holding flowers.   03/03/2023: left grip strength: 13# 01/22/2023: Left: 12# Eval: Pt. Is unable to securely hold flowers. Goal status: Ongoing   5.  Pt. will independently recall adaptive  strategies for performing ADL/ADL kitchen tasks.  Baseline:10/29/2023: Continue 11/19/204: Continue 08/20/2023: Revised to include IADL kitchen tasks.  07/23/2023: Continue 05/28/2023: Pt. Needs continued education about adaptive strategies.05/26/2023: Continue 04/09/2023: Continue3/26/2024: Continue 01/22/2023: Pt. continues to benefit from education about adaptive strategies during ADLs, and IADLs. Eval: Pt. to be provided with adaptive strategies. Goal status: Revised   6.  Pt. will improve FOTO score by 2 points to reflect patient perceived performance improvement assessment specific ADLs  and IADLs Baseline: 88/78/7975: 66 10/27/2023: TBD 09/17/2023: FOTO score: 61 07/23/2023: FOTO score: 57 05/28/2023: FOTO score 59 05/26/2023: TBD 5/02/204:  TBD 03/03/23: FOTO 62 Eval: 57 Goal status: Ongoing  7.  Pt. will improve left hand coordination skills in order to be able to  manipulate small objects.     Baseline: 10/29/2023 Left: 2 min. & 10 sec. 10/27/2023: Pt. Is improving with manipulating small objects.09/17/2023: Right: 26 sec., Left:  3 min, & 49 sec. pegs placed in 5 min.; Pt. Was able to remove 9 vertical pegs in 24 sec. 08/20/2023: Right: 27 sec., Left:  5 pegs placed in 5 min.; Pt. Was able to remove 9 vertical pegs in 28 sec. Pt. Is able to hold, and sort utensils in a drawer. Pt. Has  difficulty manipulating small objects. 07/23/2023: Right: 33 sec., Left:  Pt. Is unable to grasp, and place pegs into the pegboard. Pt. Was able to remove 9 vertical pegs in 34 sec. 05/28/2023: Left:  5 min. 05/26/2023 Pt.  Continues to present with difficulty handling and sorting utensils. 04/14/2023: 56 04/09/2023: TBD 03/03/23: 1 min. & 39 01/22/2022: Left: 3 min. & 4 sec. Eval: Pt. has difficulty sorting, and placing utensils with the left hand. Left FMC : >5 min. For 7 pegs on the 9 hole peg test.    Goal Status: Ongoing  8. Pt. will improve active left 2nd digit extension to be able able to isolate her 2nd digit in preparation for pressing/pushing buttons on appliances, phones, or remotes. Baseline: 10/29/2023: Pt. Is improving with 2nd digit isolation, to however continues to present with limited prolonged 2nd digit extension11/19/2024: Pt. Is improving with 2nd digit isolation, to however presents with limited prolonged 2nd digit extension. 09/17/2023: Pt. Presents with limited isolated 2nd digit extension with increased time. 08/20/2023: Pt. Is more consistently able to isolate 2nd digit extension with increased time. 07/23/2023: Pt. Continues to work on consistency with isolating left 2nd digit extension 05/28/2023: Pt. Continues to improve with left 2nd digit extension 05/26/2023: Improving with left 2nd digit extension. 04/09/2023: Pt. Is progressing with isolating left 2nd digit extension, however continues to present with limited increased flexor tone. 03/03/23: Pt. Continues to work on improving consistency with 2nd digit extension to press the remote. 01/22/2023: is able to perform full digit extension, although 2nd digit is slow to extend s 2/2 flexor tone. Pt.  Eval: Pt. is able to is unable to actively perform full digit extension Goal status: Ongoing  9: Pt. Will cut food with modified independence Baseline: 10/29/2023: : Pt. Continues to have difficulty cutting food on her plate.10/27/2023: Pt.  Continues to have difficulty cutting food on her plate. 09/17/2023: Pt. Is unable to cut the food on her plate, as well as cut food for meal preparation. 08/20/2023: Pt. Is unable to cut the food on her plate, as well as cut food for meal preparation.    Goal Status: Ongoing   ASSESSMENT:   CLINICAL IMPRESSION:  Pt. was able to grasp the thin pegs from a vertical position in the pegboard, however had difficulty grasping and storing the extra thin pegs in the palm of her hand. At times, Pt. had difficulty with grasping the cubes from resistance, however was able to consistently isolate 2nd digit extension in preparation for pressing them back into place. Pt. continues to work on improving LUE ROM, strength, FMC, and hand function skills in order to work towards improving, and maximizing independence with ADLs, and IADL tasks.   PERFORMANCE DEFICITS in functional skills including ADLs, IADLs, coordination, proprioception, ROM, strength, FMC, and GMC, cognitive skills including memory, and psychosocial skills including coping strategies, environmental adaptation, interpersonal interactions, and routines and behaviors.    IMPAIRMENTS are limiting patient from ADLs, IADLs, education, leisure, and social participation.    COMORBIDITIES may have co-morbidities  that affects occupational performance. Patient will benefit from skilled OT to address above impairments and improve overall function.   MODIFICATION OR ASSISTANCE TO COMPLETE EVALUATION: Min-Moderate modification of tasks or assist with assess necessary to complete an evaluation.   OT OCCUPATIONAL PROFILE AND HISTORY: Detailed assessment: Review of records and additional review of physical, cognitive, psychosocial history related to current functional performance.   CLINICAL DECISION MAKING: Moderate - several treatment options, min-mod task modification necessary   REHAB POTENTIAL: Good   EVALUATION COMPLEXITY: Moderate      PLAN: OT  FREQUENCY: 2x/week   OT DURATION: 12 weeks   PLANNED INTERVENTIONS: self care/ADL training, therapeutic exercise, therapeutic activity, neuromuscular re-education, manual therapy, passive  range of motion, functional mobility training, electrical stimulation, and paraffin   RECOMMENDED OTHER SERVICES: PT   CONSULTED AND AGREED WITH PLAN OF CARE: Patient and family member/caregiver   PLAN FOR NEXT SESSION: see above   Richardson Otter, MS, OTR/L  12/10/23, 4:30 PM

## 2023-12-15 ENCOUNTER — Ambulatory Visit: Payer: Medicare HMO | Admitting: Physical Therapy

## 2023-12-15 ENCOUNTER — Ambulatory Visit: Payer: Medicare HMO | Admitting: Occupational Therapy

## 2023-12-15 DIAGNOSIS — M6281 Muscle weakness (generalized): Secondary | ICD-10-CM

## 2023-12-15 DIAGNOSIS — R2681 Unsteadiness on feet: Secondary | ICD-10-CM | POA: Diagnosis not present

## 2023-12-15 DIAGNOSIS — R42 Dizziness and giddiness: Secondary | ICD-10-CM | POA: Diagnosis not present

## 2023-12-15 DIAGNOSIS — I6381 Other cerebral infarction due to occlusion or stenosis of small artery: Secondary | ICD-10-CM | POA: Diagnosis not present

## 2023-12-15 DIAGNOSIS — R278 Other lack of coordination: Secondary | ICD-10-CM

## 2023-12-15 DIAGNOSIS — R531 Weakness: Secondary | ICD-10-CM

## 2023-12-15 DIAGNOSIS — R262 Difficulty in walking, not elsewhere classified: Secondary | ICD-10-CM | POA: Diagnosis not present

## 2023-12-15 NOTE — Therapy (Signed)
 OUTPATIENT PHYSICAL THERAPY TREATMENT    Patient Name: [name] MRN: [id number] DOB:1941-03-07, 83 y.o., female Today's Date: 12/15/2023   PCP: Auston Purchase, D MD REFERRING PROVIDER: Maurice Sharlet RAMAN PA   END OF SESSION:  PT End of Session - 12/15/23 1627     Visit Number 24    Number of Visits 31    Date for PT Re-Evaluation 12/24/23    Authorization Type [employer]:    Authorization Time Period approved for 24 additional tx from 09/28/23 to 12/24/23; total visit count 31    Progress Note Due on Visit 30    PT Start Time 1615    PT Stop Time 1655    PT Time Calculation (min) 40 min    Equipment Utilized During Treatment Gait belt    Activity Tolerance Patient tolerated treatment well    Behavior During Therapy WFL for tasks assessed/performed                  Past Medical History:  Diagnosis Date   Anemia    Arthritis    Asthma    uses inhaler just prior to surgery to avoid attack   Back pain    from previous injury   Complication of anesthesia    has woken  up during 2 different surgery   Depression    no current issue/treatment; situation   Gallstones    GERD (gastroesophageal reflux disease)    Hiatal hernia    patient does NOT have nerve/muscle disease   History of kidney stones    HLD (hyperlipidemia)    HTN (hypertension)    Hypothyroidism    Kidney stones    Knee pain    Nausea and vomiting 10/15/2022   Non-diabetic pancreatic hormone dysfunction years   pt. states pancreas does not function properly   Pancreatitis    Pneumonia    Seizures (HCC)    caused by dye injected during a procedure   Shortness of breath    with exertion   Sinus problem    frequent infections/congestion   Stroke (HCC) 2021   reports having CVA in 2021 and having mini strokes before that   Thyroid  disease    Past Surgical History:  Procedure Laterality Date   ABDOMINAL HYSTERECTOMY     APPENDECTOMY     CARPAL TUNNEL RELEASE  10+ years ago    bilateral   EYE SURGERY  3 yrs ago   bilateral cataracts   FOOT OSTEOTOMY  6 weeks ago   Left foot: great, 2nd & 3rd   FOOT OSTEOTOMY  5 years ago   Right great toe   HAND SURGERY Bilateral 2011-most recent   multiple hand surgeries, 2 on left, 3 on right   KNEE ARTHROPLASTY Right 04/28/2022   Procedure: COMPUTER ASSISTED TOTAL KNEE ARTHROPLASTY;  Surgeon: Mardee Lynwood SQUIBB, MD;  Location: [facility];  Service: Orthopedics;  Laterality: Right;   LOOP RECORDER INSERTION N/A 05/16/2020   Procedure: LOOP RECORDER INSERTION;  Surgeon: Ammon Blunt, MD;  Location: [facility];  Service: Cardiovascular;  Laterality: N/A;   NASAL SINUS SURGERY  most recent 7-8 yrs ago   7 sinus surgeries    TRIGGER FINGER RELEASE  11/19/2011   Procedure: RELEASE TRIGGER FINGER/A-1 PULLEY;  Surgeon: Arley JONELLE Curia, MD;  Location: [facility];  Service: Orthopedics;  Laterality: Right;  release a-1 pulley right index finger and cyst removal   WRIST GANGLION EXCISION  1980's   right  ONSET DATE: 11/23/22  REFERRING DIAG: TIA  THERAPY DIAG:  Muscle weakness (generalized)  Other lack of coordination  Dizziness and giddiness  Unsteadiness on feet  Difficulty in walking, not elsewhere classified  Acute left-sided weakness  Rationale for Evaluation and Treatment: Rehabilitation  SUBJECTIVE:                                                                                                                                                                                             SUBJECTIVE STATEMENT:  Pt reports that she is doing well. Will be seeing orthopedic MD for consult this Thursday with hope for knee replacement. No pain or dizziness reported at start of session.    Pt accompanied by: significant other  PERTINENT HISTORY: [name] is an 65 year-old female who was admitted to [facility] on 11/23/2022 with a cerebellar CVA. 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 Pt. Was admitted to Inpatient Rehab from 12/21-1/02/2023. 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  Patient underwent inpatient rehabilitation for 2 weeks.  Patient was assessed and was scheduled for a knee replacement on 08/29/2022 however had to cancel it due to having had a CVA. PMH includes: anemia, arthritis, back pain, depression, hernia, GERD, HLD, HTN, hypothyroidism, knee pain, pancreatic hormone dysfunction, pneumonia, seizures, stroke, thyroid  disease.  Patient has a donjoy brace for L knee.   PAIN:  Are you having pain? no  PRECAUTIONS: Fall, has latex allergy  WEIGHT BEARING RESTRICTIONS: No  FALLS: Has patient fallen in last 6 months? Yes. Number of falls 4 falls   LIVING ENVIRONMENT: Lives with: lives with their family and lives with their spouse Lives in: House/apartment Stairs: Yes: Internal: flight steps;   and External: 2 steps; on right going up and on left going up Has following equipment at home: Single point cane, Walker - 2 wheeled, Shower bench, and bed side commode  PLOF: Independent  PATIENT GOALS: to have less pain and move better  OBJECTIVE:   TODAY'S TREATMENT: DATE: 12/15/23 Gait belt donned throughout for pt safety. PT provided CGA-min a throughout session.  Nustpep x 6 min AAROM, level 1-3 with cues for SPM >50. Reports mild L shoulder pain limiting full shoulder flexion.   Gait with RW in rehab gym x 65ft, 71ft and 59ft. Forward/reverse gait with RW 2 x 71ft with cues for AD management to reduce fall risk.  Pt required rest break in sidelying on the R side due to nausea following last 2 bouts of gait training. No lightheaded.   VOR x 30 sec no dizziness notes.  Saccades at ~ 40deg bil x moderate dizzinessaccades at ~30 DEG only mild dizziness.  Education for gaze stabilization with gait to reduced dizziness.   Seated therex:  LAQ 3# AW x  12  Hip flexion 3# AW x 12   Ankle DF/PF 3# AW x 15      PATIENT EDUCATION: Education details: Pt educated throughout session about proper posture and technique with exercises. Improved exercise technique, movement at target joints, use of target muscles after min to mod verbal, visual, tactile cues.   HOME EXERCISE PROGRAM:  Access Code: [email or web address] URL: [email or web address] Date: 10/07/2023 Prepared by: Massie Dollar  Exercises - Standing March with Counter Support  - 1 x daily - 3 x weekly - 2 sets - 10 reps - Standing Hip Abduction with Unilateral Counter Support  - 1 x daily - 3 x weekly - 2 sets - 10 reps - 2 hold - Standing Hip Extension with Unilateral Counter Support  - 1 x daily - 3 x weekly - 2 sets - 10 reps - 2 hold - Supine Straight Leg Raises  - 1 x daily - 37 x weekly - 2 sets - 10 reps - Bridge  - 1 x daily - 3 x weekly - 2 sets - 10 reps - 2 hold - Supine Heel Slide  - 1 x daily - 3 x weekly - 2 sets - 10 reps  GOALS: Goals reviewed with patient? Yes  SHORT TERM GOALS: Target date: 09/19/23  Patient will be independent in starter HEP to improve A/ROM tolerance and preserve available ROM of LLE and to advance A/ROM and strength of RLE.  Baseline: 08/20/23: defer to vists 2-3  Goal status: NEW  LONG TERM GOALS: Target date: 10/20/23  Patient will increase FOTO score to equal to or greater than 65%  to demonstrate statistically significant improvement in ease of mobility.  Baseline:  47%; 06/23/23: 60;  7/16: 60%  10/31: 54 11/19/23: 56 Goal status: PROGRESSING   2.  Patient (> 30 years old) will complete five times sit to stand test in < 15 seconds indicating an increased RLE power.  Baseline: 5/7: unable to tolerate 7/16: 17.4 sec 10/31: 11.02 sec 11/19/23: 13.06 sec Goal status: PROGRESSING   3.   Patient to report tolerance of ad lib AMB in home during the day of distances >60ft >5x at supervision level or better to improve independence with ADL.    Baseline:  08/20/23: using transport chair for nearly all mobility, not tolerating walking due to WB limitations of LLE  10/31: able to ambulate 30-38ft with CGA from PT.  11/19/23: Pt able to ambulate 255.3' with CGA, FWW, and chair follow Goal status: PROGRESSING      ASSESSMENT:  CLINICAL IMPRESSION:   VOR and saccade training performed. Mild dizziness with saccades, no dizziness with VOR. Continued gait and BLE strengthening. Mild dizziness following each bout of gait training, requiring sidelying rest breaks of ~ 45sec to stop.  Education for use of saccade training habituation and use of gaze stabilization with gait to reduce dizziness/nausea. Pt will continue to benefit from skilled therapy to address remaining deficits in order to improve overall QoL and return to PLOF.     OBJECTIVE IMPAIRMENTS: Abnormal gait, cardiopulmonary status limiting activity, decreased activity tolerance, decreased balance, decreased cognition, decreased endurance, decreased knowledge of use of DME, decreased mobility, difficulty walking, decreased ROM, decreased strength, decreased safety awareness, hypomobility, increased fascial restrictions, impaired perceived functional ability, impaired flexibility, impaired UE functional use,  improper body mechanics, postural dysfunction, and pain.   ACTIVITY LIMITATIONS: carrying, lifting, bending, sitting, standing, squatting, stairs, transfers, bed mobility, bathing, toileting, dressing, reach over head, hygiene/grooming, locomotion level, and caring for others  PARTICIPATION LIMITATIONS: meal prep, cleaning, laundry, medication management, personal finances, interpersonal relationship, driving, shopping, community activity, yard work, school, and church  PERSONAL FACTORS: Age, Behavior pattern, Education, Fitness, Past/current experiences, Time since onset of injury/illness/exacerbation, and 3+ comorbidities: anemia, arthritis, back pain, depression, hernia, GERD, HLD, HTN,  hypothyroidism, knee pain, pancreatic hormone dysfunction, pneumonia, seizures, stroke, thyroid  disease  are also affecting patient's functional outcome.   REHAB POTENTIAL: Fair    CLINICAL DECISION MAKING: Evolving/moderate complexity  EVALUATION COMPLEXITY: Moderate  PLAN:  PT FREQUENCY: 1-2x/week  PT DURATION: 10 weeks  PLANNED INTERVENTIONS: Therapeutic exercises, Therapeutic activity, Neuromuscular re-education, Balance training, Gait training, Patient/Family education, Self Care, Joint mobilization, Joint manipulation, Stair training, Vestibular training, Canalith repositioning, Visual/preceptual remediation/compensation, Orthotic/Fit training, DME instructions, Electrical stimulation, Wheelchair mobility training, Spinal mobilization, Cryotherapy, Moist heat, Compression bandaging, scar mobilization, Splintting, Taping, Traction, Ultrasound, Manual therapy, and Re-evaluation   PLAN FOR NEXT SESSION:  Tolerance to standing in functional movement/ tasks.  Continue BLE strengthening in weighted and unweighted position Further vestibular assessment  Massie Dollar PT, DPT  Physical Therapist - [facility]  [facility]  8:45 AM 12/16/23

## 2023-12-15 NOTE — Therapy (Addendum)
 Occupational Therapy Progress Note  Dates of reporting period  10/27/2023   to   10/14/2024     Patient Name: [name] MRN: [id number] DOB:26-Jun-1941, 83 y.o., female Today's Date: 12/15/2023  PCP: Dr. Auston REFERRING PROVIDER:  Dr. Auston  END OF SESSION:  PLAN:   OT End of Session - 12/15/23 1539     Visit Number 80    Number of Visits 120    Date for OT Re-Evaluation 03/08/24    Authorization Time Period Progress report period starting 12/16/2022    OT Start Time 1537    OT Stop Time 1615    OT Time Calculation (min) 38 min    Activity Tolerance Patient tolerated treatment well    Behavior During Therapy WFL for tasks assessed/performed                   Past Medical History:  Diagnosis Date   Anemia    Arthritis    Asthma    uses inhaler just prior to surgery to avoid attack   Back pain    from previous injury   Complication of anesthesia    has woken  up during 2 different surgery   Depression    no current issue/treatment; situation   Gallstones    GERD (gastroesophageal reflux disease)    Hiatal hernia    patient does NOT have nerve/muscle disease   History of kidney stones    HLD (hyperlipidemia)    HTN (hypertension)    Hypothyroidism    Kidney stones    Knee pain    Nausea and vomiting 10/15/2022   Non-diabetic pancreatic hormone dysfunction years   pt. states pancreas does not function properly   Pancreatitis    Pneumonia    Seizures (HCC)    caused by dye injected during a procedure   Shortness of breath    with exertion   Sinus problem    frequent infections/congestion   Stroke (HCC) 2021   reports having CVA in 2021 and having mini strokes before that   Thyroid  disease    Past Surgical History:  Procedure Laterality Date   ABDOMINAL HYSTERECTOMY     APPENDECTOMY     CARPAL TUNNEL RELEASE  10+ years ago   bilateral   EYE SURGERY  3 yrs ago   bilateral cataracts   FOOT OSTEOTOMY  6 weeks ago   Left foot:  great, 2nd & 3rd   FOOT OSTEOTOMY  5 years ago   Right great toe   HAND SURGERY Bilateral 2011-most recent   multiple hand surgeries, 2 on left, 3 on right   KNEE ARTHROPLASTY Right 04/28/2022   Procedure: COMPUTER ASSISTED TOTAL KNEE ARTHROPLASTY;  Surgeon: Mardee Lynwood SQUIBB, MD;  Location: [facility];  Service: Orthopedics;  Laterality: Right;   LOOP RECORDER INSERTION N/A 05/16/2020   Procedure: LOOP RECORDER INSERTION;  Surgeon: Ammon Blunt, MD;  Location: [facility];  Service: Cardiovascular;  Laterality: N/A;   NASAL SINUS SURGERY  most recent 7-8 yrs ago   7 sinus surgeries    TRIGGER FINGER RELEASE  11/19/2011   Procedure: RELEASE TRIGGER FINGER/A-1 PULLEY;  Surgeon: Arley JONELLE Curia, MD;  Location: [facility];  Service: Orthopedics;  Laterality: Right;  release a-1 pulley right index finger and cyst removal   WRIST GANGLION EXCISION  1980's   right   Patient Active Problem List   Diagnosis Date Noted   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 11/27/2022   Hypomagnesemia 11/27/2022   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   HLD (hyperlipidemia) 11/23/2022   Asthma 11/23/2022   Depression with anxiety 11/23/2022   Chronic diastolic CHF (congestive heart failure) (HCC) 11/23/2022   Normocytic anemia 11/23/2022   Aspiration pneumonia (HCC) 11/23/2022   AKI (acute kidney injury) (HCC) 11/23/2022   Abdominal pain 11/23/2022   Mesenteric mass 11/23/2022   Coffee ground emesis 11/23/2022   Nausea and vomiting 10/15/2022   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   Fatigue 09/17/2022   Left hemiparesis (HCC) 09/17/2022   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HCC) 08/22/2022   GERD (gastroesophageal reflux disease) 08/21/2022   Agitation 08/20/2022   Acute left-sided weakness 08/20/2022   Expressive aphasia    Stroke (HCC) 08/19/2022   Leukocytosis 08/19/2022   History of urticaria 04/28/2022   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   Primary  osteoarthritis of left knee 02/24/2022   Primary osteoarthritis of right knee 02/24/2022   Lumbar spondylolysis 04/12/2020   History of CVA (cerebrovascular accident) 03/26/2020   Low back pain radiating to right lower extremity 03/21/2020   B12 deficiency 03/06/2020   Positive anti-CCP test 12/21/2019   Arthralgia 12/13/2019   Dermatitis 12/13/2019   Rheumatoid factor positive 12/13/2019   Essential hypertension 12/11/2018   Palpitations 12/11/2018   Acquired hypothyroidism 11/10/2018   Arthritis of knee 09/17/2016   Anxiety 11/22/2014   Asthma without status asthmaticus 11/22/2014   Benign neoplasm of colon, unspecified 11/22/2014   Environmental allergies 11/22/2014   Hypertriglyceridemia 11/22/2014   Hypokalemia 11/22/2014   Personal history of disease of skin and subcutaneous tissue 11/22/2014   REFERRING DIAG: CVA   THERAPY DIAG:  Muscle weakness (generalized)   Other lack of coordination   Rationale for Evaluation and Treatment Rehabilitation   SUBJECTIVE:   SUBJECTIVE STATEMENT:  Pt. reports doing well today.  Pt accompanied by: significant other   PERTINENT HISTORY: Patient is an 83 year-old female who was admitted to [facility] on 11/23/2022 with a cerebellar CVA. 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 Pt. Was admitted to Inpatient Rehab from 12/21-1/02/2023. 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  Patient underwent inpatient rehabilitation for 2 weeks.  Patient was assessed and was scheduled for a knee replacement on 08/29/2022 however had to cancel it due to having had a CVA.  Patient had a recent fall 2 days after discharging from inpatient rehab. Past Medical History includes: Knee replacement, essential HTN, hypokalemia, leukocytosis, seizures, positive anti-- CCP test, anxiety disorder, mini strokes.  Patient had shingles with left eye nerve pain s/p 1 year ago.     PRECAUTIONS: Fall   WEIGHT BEARING RESTRICTIONS No   PAIN:  Are you having pain?  No pain currently. 2/10 LUE and 5/10 neck pain earlier in the day   FALLS: Has patient fallen in last 6 months? Yes. Number of falls 1   LIVING ENVIRONMENT: Lives with: Lives with Spouse Lives in: House/apartment Stairs: 2 storey home, resides on the first floor.  External: 2 stairs front no rails, and 6 in back with rails Has following equipment at home: Single point cane, Walker - 2 wheeled, Environmental Consultant - 4 wheeled, Shower bench, and bed side commode   PLOF: Independent   PATIENT GOALS  To Regain the use of her left arm   OBJECTIVE:    HAND DOMINANCE: Right   ADLs: Overall ADLs: Husband assists pt. as needed Transfers/ambulation related to ADLs:Pt.  Uses a 3 wheeled walker with Husband assist. Eating: Pt. Is independent with the right hand. Pt. has difficulty cutting food. Grooming: Pt. is using her right hand, however has difficulty sustaining her LUE in elevation to assist with haircare. UB Dressing: Pt. Is independent donning a pullover shirt, and button down shirt. Has difficulty with buttoning, LB Dressing:  Independent donning pants, and socks. Difficulty tying shoes. Toileting: Independent Bathing: Pt. Is able to engage her right hand. Tub Shower transfers: Supervision Equipment: See above for equipment    IADLs: Shopping:  Has not had the opportunity for grocery shopping yet Light housekeeping: Husband is assisting with light house keeping Meal Prep:  Dependent Community mobility: Relies of family/friends Medication management: Husband assisting with weekly pillbox set-up, and administering medication. Financial management: TBD Handwriting: 75% legible   MOBILITY STATUS: Hx of falls   POSTURE COMMENTS:  No Significant postural limitations Sitting balance: supported sitting balance WFL   ACTIVITY TOLERANCE: Activity tolerance:  Fatigues in greater than 30 min.    FUNCTIONAL  OUTCOME MEASURES: FOTO: 57   UPPER EXTREMITY ROM      Active ROM Right Eval: WFL Left eval Left  01/22/23 Left  03/03/23 Left  04/14/23 Left  05/28/2023 Left 07/23/2023 Left 08/20/2023 Left 09/17/23 Left 10/29/23 Left 10/29/23  Shoulder flexion   132 100 108 108 108 60(70) 70(78) 70(102) 82(110) 87(110)  Shoulder abduction   80 85 85 85 85 65(78) 72(85) 73(94) 75(98) 75(103)  Shoulder adduction               Shoulder extension               Shoulder internal rotation               Shoulder external rotation               Elbow flexion   140 140 [facility] WFL  Elbow extension   WNL WNL [facility] WFL WFL WFL FL  Wrist flexion   65 68          Wrist extension   -10 20 24  32 32 -10 20 20 26  32  Wrist ulnar deviation     12 10 14 14 10 18 18 20 18   Wrist radial deviation     8 14 14 16 12 14 14 14 20   Wrist pronation               Wrist supination               (Blank rows = not tested)   Left digit flexion to [facility]: 2nd: 0cm, 3rd: 0cm, 4th: 0cm, 5th: 0cm   Limited Left full 2nd digit extension     UPPER EXTREMITY MMT:      MMT Right Eval: 4+/5 overall Right 07/23/2023 Left Eval Left 01/22/23 Left 03/03/23 Left  04/14/2023 Left 05/28/2023 Left 07/23/2023 Left 08/20/2023 Left 09/17/23 Left  10/29/23 Left 10/14/24  Shoulder flexion   3+/5 3/5 3-/5 3-/5 3-/5 3-/5 2/5 2+/5 2+/5 3-/5 3-/5  Shoulder abduction   3+/5 3-/5 3-/5 3-/5 3-/5 3-/5 2/5 2+/5 2+/5 3-/5 3-/5  Shoulder adduction                Shoulder extension                Shoulder internal rotation                Shoulder external  rotation                Middle trapezius                Lower trapezius                Elbow flexion   4/5 3+/5 4/5 N/A 4/5 4/5 3+/5 3+/5 3+/5 3+/5 4-/5  Elbow extension   4/5 3+/5 4/4 N/A 4/5 4/5 3+/5 3+/5 4-/5 4-/5 4/5  Wrist flexion                Wrist extension   4/5 2-/5 3-/5 3-/5 3/5 3+/5 3-/5 3+/5 3+/5 3+/5 3+/5  Wrist ulnar deviation                Wrist  radial deviation                Wrist pronation                Wrist supination                (Blank rows = not tested)   HAND FUNCTION: Grip strength: Right: 26#, Left: 10# Pinch strength: Right 8#, Left: 3#, 3 Pt. Pinch strength: Right: 9#, L: 2#  01/22/2023 Grip strength: Right: 26#, Left: 12# Pinch strength:  Pinch meter used at the initial eval has been sent out for recalibration   03/03/2023 Grip strength: Right: 26#, Left: 13# Pinch strength:  Pinch meter used at the initial eval has been sent out for recalibration  04/14/2023: Grip strength: Right: 26#, Left: 13# Pinch strength:   Right 8#, Left: 4#, 3 Pt. Pinch strength: Right: 9#, L: 3#    05/28/2023: Grip strength: Right: 26#, Left: 13# Pinch strength:   Right 8#, Left: 2#, 3 Pt. Pinch strength: Right: 9#, L: 4#  07/23/2023  Grip strength: Right: 5#, Left: 2# Pinch strength:   Right 6#, Left: 2#, 3 Pt. Pinch strength: Right: 5#, L: 2#  08/20/2023:    Grip strength: Right: 8#, Left: 4# Pinch strength:   Right 6#, Left: 2#, 3 Pt. Pinch strength: Right: 5#, L: 2#    09/17/23:  Grip strength: Right: 5#, Left: 4# Pinch strength:   Right 6#, Left: 2#, 3 Pt. Pinch strength: Right: 5#, L: 2#  10/28/2024  Grip strength: Right: 13#, Left: 6# Pinch strength:   Right 6#, Left: 2#, 3 Pt. Pinch strength: Right: 5#, L: 2#  12/15/2023:  Grip strength: Right: 13#, Left:10# Pinch strength:  Right 6#, Left: 0#, 3 Pt. Pinch strength: Right: 5#, L: 4#     COORDINATION: Right: 22 sec., Left: <5 min. To place 7 pegs with increased compensation proximally in the trunk, and through reflexive associated reactions.  01/22/23 Right: 22 sec., Left: 3 min. & 4 sec.  03/03/23 Right: 22 sec., Left: 1 min. & 39 sec.  04/14/23   TBD  05/28/23 Right: 22 sec., Left:  5 min.  07/23/2023  Right: 33 sec., Left:  Pt. Is unable to grasp, and place pegs into the pegboard. Pt. Was able to remove 9 vertical pegs in 34  sec.  08/20/2023:  Right: 27 sec., Left:  5 pegs placed in 5 min.; Pt. Was able to remove 9 vertical pegs in 28 sec.  09/17/23:  Right: 26 sec., Left:  3 min, & 49 sec.    10/28/2024:  Right: 26 sec., Left:  2 min, & 10 sec.  12/15/23  Right: 26 sec., Left:  4 min, & 8  sec.  SENSATION: Light touch: WFL, proprioceptive awareness: Intact   EDEMA: N/A   MUSCLE TONE: LUE: Hypotonic   COGNITION: Overall cognitive status: WFL for tasks assessed. Pt. Is impulsive at times.   VISION: Subjective report: Pt. report having shingles affecting left eye  s/p 1 year. Has nerve pain Baseline vision: Wears glasses for reading only Visual history:  updated see clinical impression   VISION ASSESSMENT:    WFL for tasks performed   PERCEPTION: Intact   PRAXIS: Impaired: Motor planning   OBSERVATIONS:  Pt. more alert, and engaging since prior to the most recent hospitalization.    TODAY'S TREATMENT:     Measurements were obtained, and goals were reviewed with the Pt.       PATIENT EDUCATION: Education details: LUE therapeutic exercise Person educated: Patient and Spouse Education method: Medical Illustrator Education comprehension: verbalized understanding, returned demonstration, and needs further education   HOME EXERCISE PROGRAM:   Reviewed activities at home to promote isolated 2nd digit extension.    GOALS: Goals reviewed with patient? Yes   SHORT TERM GOALS: Target date: 12/10/2023   1. Patient will be independent with home exercise program for the left upper extremity Baseline: 12/15/2023: Independent. 10/29/2023: Independent 09/17/23: Independent 08/20/23: Pt. continues to require assist from he husband for HEPs 07/23/2023: Pt. continues to require assist. 05/28/2023: Pt. Requires assist 04/09/2023: Pt. Continues to consistently attempt to engage her hand at hand 02/01/2023: Pt. Consistently attempts to perform HEPs independently. No current home exercise  program Goal status: Ongoing   LONG TERM GOALS: Target date:  01/21/2024  1.  Patient will improve left shoulder strength by 2 mm grades to be able to sustain UE's in elevation long enough to wash her hair.  Baseline: 12/15/2023:  Left shoulder flexion 3-/5, abduction: 3-/5 10/29/2023: Left shoulder flexion 3-/5, abduction: 3-/5 10/27/2023:  Pt. Is improving with reaching up to reach her hair for hair care. Pt. Continues to have difficulty sustaining the UE in elevation long enough to perform hair care. 09/17/2023: Left shoulder 2+/5, abduction 2+/5 08/20/2023: Left shoulder flexion 2+/5, abduction 2+/5  07/23/2023: Left shoulder flexion: 2/5, abduction: 2/5. 05/28/2023: Left shoulder flexion: 3-/5, abduction: 3-/5. Pt. has an old left shoulder injury limiting progression with left shoulder strength 05/26/2023: Pt. continues to present with limitiations in sustaining LUE elevation long enough to perform hair care thoroughly 04/09/2023: Pt. is limited with sustaining LUE elevation long enough to perform hair care thoroughly. 03/03/2023: Left shoulder flexion: 3-/5, abduction: 3-/5 01/22/2023: Left shoulder flexion: 3-/5, abduction: 3-/5 Eval: Left shoulder flexion: 3/5, abduction: 3-/5 Goal status: Ongoing   2.  Patient will improve left shoulder active abduction to be able to comb her hair Baseline: 12/15/2023: 75(103) Pt. Has difficulty using her left hand to reach the right side, and back of her hair. 10/29/2023: Left shoulder abduction: 75(98) 10/27/2023: Pt. Is improving with reaching the left side of her hair for haircare. Pt. Has difficulty reaching with the left side to the back, and right side of her head.09/17/2023: 73(94) Pt. is able to reach her left side, and back of hair. 9/11/09/2023: 72(85) Pt. is able to reach her left side, and back of hair. 07/23/2023: Left shoulder flexion: 60(70) abduction: 65(78) 05/28/2023: left shoulder flexion: 108 abduction: 85 Pt. Has an old left shoulder injury limiting  progression with left shoulder ROM 05/26/2023:  5/10 left shoulder pain with ROM limits using it functionally during hair care. 04/09/2023: Pt. Presents with difficulty abducting her left shoulder enough to  thoroughly complete haircare. 03/03/2023: Shoulder abduction: 85 01/22/2023: Shoulder abduction: 85 Eval: Left shoulder abduction is 80(108) Goal status: Ongoing   3.  Patient will independently button her shirt with modified independence. Baseline:12/15/2023:  Pt.is engaging her left hand more, continues to have difficulty with buttoning.10/29/2023: Pt. Has difficulty manipulating buttons, will benefit from a buttonhook.10/27/23: Pt. Requires increased time to complete buttons.09/17/2023: Pt. Has a buttonhook, however does not know where it is. Pt. requires increased time to complete, and assist from her husband. 08/20/2023: Pt. requires increased time to complete, and assist from her husband. 07/23/2023 Pt. presents with difficulty buttoning her shirt. 05/28/2023: pt. Continues to work towards progressing with buttoning 05/26/2023:  Pt. Continues to progress towards buttoning. 04/09/2023: Pt. Continues to progress towards buttoning. 03/03/2023: Pt. Continues to have difficulty with buttoning. 01/22/2023: Pt. continues to have difficulty. Eval: Patient has difficulty.  Goal status: Ongoing   4.  Patient well improve left grip strength in preparation for securely hold a glass/beverage Baseline: 12/15/2023:  Grip strength: Right: 13#  Left: 10# 10/29/2023: Grip strength: Right: 13#  Left: 6#10/27/2023: Pt. Has difficulty securely holding heavier items with larger diameter. 09/17/2023: Grip strength: Right: 5#, Left: 4# Pt. Has difficulty holding a glass. Right: 8#, Left: 4# Pt. Is able to more securely hold mirrors, and flowers with the left hand, however has difficulty holding a glass beverage, or bottle. 08/20/2023: Right: 8#, Left: 4# Pt. Is able to more securely hold mirrors, and flowers with the left hand, however  has difficulty holding a glass beverage, or bottle. 07/23/2023: Grip strength: Right: 5#, Left: 2# 05/28/2023: Right: 26#, Left: 13#  pt. Presents with difficulty securely holding flowers in her left hand. 05/26/2023: TBD 04/09/2023: Pt. Is able to hold, and hike pants with the left hand, continues to have difficulty with securely holding flowers.   03/03/2023: left grip strength: 13# 01/22/2023: Left: 12# Eval: Pt. Is unable to securely hold flowers. Goal status: Ongoing   5.  Pt. will independently recall adaptive strategies for performing ADL/ADL kitchen tasks.  Baseline: 12/15/2023: Independent 10/29/2023: Continue 11/19/204: Continue 08/20/2023: Revised to include IADL kitchen tasks.  07/23/2023: Continue 05/28/2023: Pt. Needs continued education about adaptive strategies.05/26/2023: Continue 04/09/2023: Continue3/26/2024: Continue 01/22/2023: Pt. continues to benefit from education about adaptive strategies during ADLs, and IADLs. Eval: Pt. to be provided with adaptive strategies. Goal status: Ongoing   6.  Pt. will improve FOTO score by 2 points to reflect patient perceived performance improvement assessment specific ADLs  and IADLs Baseline: 8/92/7974: 73 10/29/2023: 66 10/27/2023: TBD 09/17/2023: FOTO score: 61 07/23/2023: FOTO score: 57 05/28/2023: FOTO score 59 05/26/2023: TBD 5/02/204:  TBD 03/03/23: FOTO 62 Eval: 57 Goal status: Ongoing  7.  Pt. will improve left hand coordination skills in order to be able to  manipulate small objects.    Baseline: 12/15/2023: 4 min. & 8 sec. 10/29/2023 Left: 2 min. & 10 sec. 10/27/2023: Pt. Is improving with manipulating small objects.09/17/2023: Right: 26 sec., Left:  3 min, & 49 sec. pegs placed in 5 min.; Pt. Was able to remove 9 vertical pegs in 24 sec. 08/20/2023: Right: 27 sec., Left:  5 pegs placed in 5 min.; Pt. Was able to remove 9 vertical pegs in 28 sec. Pt. Is able to hold, and sort utensils in a drawer. Pt. Has difficulty manipulating small objects. 07/23/2023:  Right: 33 sec., Left:  Pt. Is unable to grasp, and place pegs into the pegboard. Pt. Was able to remove 9 vertical pegs in 34 sec.  05/28/2023: Left:  5 min. 05/26/2023 Pt. Continues to present with difficulty handling and sorting utensils. 04/14/2023: 56 04/09/2023: TBD 03/03/23: 1 min. & 39 01/22/2022: Left: 3 min. & 4 sec. Eval: Pt. has difficulty sorting, and placing utensils with the left hand. Left FMC : >5 min. For 7 pegs on the 9 hole peg test.    Goal Status: Ongoing  8. Pt. will improve active left 2nd digit extension to be able able to isolate her 2nd digit in preparation for pressing/pushing buttons on appliances, phones, or remotes. Baseline: 12/15/2023: Pt. Continues to improve with 2nd digit isolation, to however continues to present with limited prolonged 2nd digit extension 10/29/2023: Pt. Is improving with 2nd digit isolation, to however continues to present with limited prolonged 2nd digit extension11/19/2024: Pt. Is improving with 2nd digit isolation, to however presents with limited prolonged 2nd digit extension. 09/17/2023: Pt. Presents with limited isolated 2nd digit extension with increased time. 08/20/2023: Pt. Is more consistently able to isolate 2nd digit extension with increased time. 07/23/2023: Pt. Continues to work on consistency with isolating left 2nd digit extension 05/28/2023: Pt. Continues to improve with left 2nd digit extension 05/26/2023: Improving with left 2nd digit extension. 04/09/2023: Pt. Is progressing with isolating left 2nd digit extension, however continues to present with limited increased flexor tone. 03/03/23: Pt. Continues to work on improving consistency with 2nd digit extension to press the remote. 01/22/2023: is able to perform full digit extension, although 2nd digit is slow to extend s 2/2 flexor tone. Pt.  Eval: Pt. is able to is unable to actively perform full digit extension Goal status: Ongoing  9: Pt. Will cut food with modified independence Baseline:  12/15/2023: Pt. continues to have difficulty cutting food on her plate 88/78/7975: : Pt. continues to have difficulty cutting food on her plate.10/27/2023: Pt. Continues to have difficulty cutting food on her plate. 09/17/2023: Pt. Is unable to cut the food on her plate, as well as cut food for meal preparation. 08/20/2023: Pt. is unable to cut the food on her plate, as well as cut food for meal preparation.    Goal Status: Ongoing   ASSESSMENT:   CLINICAL IMPRESSION:  Measurements were obtained, and goals were reviewed with the Pt. Pt.  has made progress with Left shoulder ROM, elbow strength, left grip strength, and pinch strength over this past progress reporting period. Pt. is now trying to engage her left UE more during daily ADL, and IADL tasks.  Pt. reports having had more Left shoulder, and neck pain at home which affects her ability to perform tabletop tasks which require her to look down for extended periods of time. Pt. continues to work on improving LUE ROM, strength, FMC, and hand function skills in order to work towards improving, and maximizing independence with ADLs, and IADL tasks.   PERFORMANCE DEFICITS in functional skills including ADLs, IADLs, coordination, proprioception, ROM, strength, FMC, and GMC, cognitive skills including memory, and psychosocial skills including coping strategies, environmental adaptation, interpersonal interactions, and routines and behaviors.    IMPAIRMENTS are limiting patient from ADLs, IADLs, education, leisure, and social participation.    COMORBIDITIES may have co-morbidities  that affects occupational performance. Patient will benefit from skilled OT to address above impairments and improve overall function.   MODIFICATION OR ASSISTANCE TO COMPLETE EVALUATION: Min-Moderate modification of tasks or assist with assess necessary to complete an evaluation.   OT OCCUPATIONAL PROFILE AND HISTORY: Detailed assessment: Review of records and additional  review of physical, cognitive, psychosocial history  related to current functional performance.   CLINICAL DECISION MAKING: Moderate - several treatment options, min-mod task modification necessary   REHAB POTENTIAL: Good   EVALUATION COMPLEXITY: Moderate      PLAN: OT FREQUENCY: 2x/week   OT DURATION: 12 weeks   PLANNED INTERVENTIONS: self care/ADL training, therapeutic exercise, therapeutic activity, neuromuscular re-education, manual therapy, passive range of motion, functional mobility training, electrical stimulation, and paraffin   RECOMMENDED OTHER SERVICES: PT   CONSULTED AND AGREED WITH PLAN OF CARE: Patient and family member/caregiver   PLAN FOR NEXT SESSION: see above   Richardson Otter, MS, OTR/L  12/15/23, 3:41 PM

## 2023-12-17 ENCOUNTER — Ambulatory Visit: Payer: Medicare HMO | Admitting: Occupational Therapy

## 2023-12-17 ENCOUNTER — Ambulatory Visit: Payer: Medicare HMO | Admitting: Physical Therapy

## 2023-12-17 DIAGNOSIS — Z96651 Presence of right artificial knee joint: Secondary | ICD-10-CM | POA: Diagnosis not present

## 2023-12-17 DIAGNOSIS — R262 Difficulty in walking, not elsewhere classified: Secondary | ICD-10-CM | POA: Diagnosis not present

## 2023-12-17 DIAGNOSIS — I6381 Other cerebral infarction due to occlusion or stenosis of small artery: Secondary | ICD-10-CM | POA: Diagnosis not present

## 2023-12-17 DIAGNOSIS — R278 Other lack of coordination: Secondary | ICD-10-CM | POA: Diagnosis not present

## 2023-12-17 DIAGNOSIS — M6281 Muscle weakness (generalized): Secondary | ICD-10-CM | POA: Diagnosis not present

## 2023-12-17 DIAGNOSIS — R2681 Unsteadiness on feet: Secondary | ICD-10-CM

## 2023-12-17 DIAGNOSIS — M1712 Unilateral primary osteoarthritis, left knee: Secondary | ICD-10-CM | POA: Diagnosis not present

## 2023-12-17 DIAGNOSIS — R531 Weakness: Secondary | ICD-10-CM | POA: Diagnosis not present

## 2023-12-17 DIAGNOSIS — R42 Dizziness and giddiness: Secondary | ICD-10-CM

## 2023-12-17 NOTE — Therapy (Signed)
 Occupational Therapy Treatment Note      Patient Name: [name] MRN: [id number] DOB:1941-04-16, 83 y.o., female Today's Date: 12/17/2023  PCP: Dr. Auston REFERRING PROVIDER:  Dr. Auston  END OF SESSION:  PLAN:   OT End of Session - 12/17/23 2259     Visit Number 81    Number of Visits 120    Date for OT Re-Evaluation 03/08/24    Authorization Time Period Progress report period starting 12/16/2022    OT Start Time 1535    OT Stop Time 1615    OT Time Calculation (min) 40 min    Activity Tolerance Patient tolerated treatment well    Behavior During Therapy WFL for tasks assessed/performed                   Past Medical History:  Diagnosis Date   Anemia    Arthritis    Asthma    uses inhaler just prior to surgery to avoid attack   Back pain    from previous injury   Complication of anesthesia    has woken  up during 2 different surgery   Depression    no current issue/treatment; situation   Gallstones    GERD (gastroesophageal reflux disease)    Hiatal hernia    patient does NOT have nerve/muscle disease   History of kidney stones    HLD (hyperlipidemia)    HTN (hypertension)    Hypothyroidism    Kidney stones    Knee pain    Nausea and vomiting 10/15/2022   Non-diabetic pancreatic hormone dysfunction years   pt. states pancreas does not function properly   Pancreatitis    Pneumonia    Seizures (HCC)    caused by dye injected during a procedure   Shortness of breath    with exertion   Sinus problem    frequent infections/congestion   Stroke (HCC) 2021   reports having CVA in 2021 and having mini strokes before that   Thyroid  disease    Past Surgical History:  Procedure Laterality Date   ABDOMINAL HYSTERECTOMY     APPENDECTOMY     CARPAL TUNNEL RELEASE  10+ years ago   bilateral   EYE SURGERY  3 yrs ago   bilateral cataracts   FOOT OSTEOTOMY  6 weeks ago   Left foot: great, 2nd & 3rd   FOOT OSTEOTOMY  5 years ago   Right  great toe   HAND SURGERY Bilateral 2011-most recent   multiple hand surgeries, 2 on left, 3 on right   KNEE ARTHROPLASTY Right 04/28/2022   Procedure: COMPUTER ASSISTED TOTAL KNEE ARTHROPLASTY;  Surgeon: Mardee Lynwood SQUIBB, MD;  Location: [facility];  Service: Orthopedics;  Laterality: Right;   LOOP RECORDER INSERTION N/A 05/16/2020   Procedure: LOOP RECORDER INSERTION;  Surgeon: Ammon Blunt, MD;  Location: [facility];  Service: Cardiovascular;  Laterality: N/A;   NASAL SINUS SURGERY  most recent 7-8 yrs ago   7 sinus surgeries    TRIGGER FINGER RELEASE  11/19/2011   Procedure: RELEASE TRIGGER FINGER/A-1 PULLEY;  Surgeon: Arley JONELLE Curia, MD;  Location: [facility];  Service: Orthopedics;  Laterality: Right;  release a-1 pulley right index finger and cyst removal   WRIST GANGLION EXCISION  1980's   right   Patient Active Problem List   Diagnosis Date Noted   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 11/27/2022   Hypomagnesemia 11/27/2022  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   HLD (hyperlipidemia) 11/23/2022   Asthma 11/23/2022   Depression with anxiety 11/23/2022   Chronic diastolic CHF (congestive heart failure) (HCC) 11/23/2022   Normocytic anemia 11/23/2022   Aspiration pneumonia (HCC) 11/23/2022   AKI (acute kidney injury) (HCC) 11/23/2022   Abdominal pain 11/23/2022   Mesenteric mass 11/23/2022   Coffee ground emesis 11/23/2022   Nausea and vomiting 10/15/2022   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   Fatigue 09/17/2022   Left hemiparesis (HCC) 09/17/2022   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HCC) 08/22/2022   GERD (gastroesophageal reflux disease) 08/21/2022   Agitation 08/20/2022   Acute left-sided weakness 08/20/2022   Expressive aphasia    Stroke (HCC) 08/19/2022   Leukocytosis 08/19/2022   History of urticaria 04/28/2022   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   Primary osteoarthritis of left knee 02/24/2022   Primary  osteoarthritis of right knee 02/24/2022   Lumbar spondylolysis 04/12/2020   History of CVA (cerebrovascular accident) 03/26/2020   Low back pain radiating to right lower extremity 03/21/2020   B12 deficiency 03/06/2020   Positive anti-CCP test 12/21/2019   Arthralgia 12/13/2019   Dermatitis 12/13/2019   Rheumatoid factor positive 12/13/2019   Essential hypertension 12/11/2018   Palpitations 12/11/2018   Acquired hypothyroidism 11/10/2018   Arthritis of knee 09/17/2016   Anxiety 11/22/2014   Asthma without status asthmaticus 11/22/2014   Benign neoplasm of colon, unspecified 11/22/2014   Environmental allergies 11/22/2014   Hypertriglyceridemia 11/22/2014   Hypokalemia 11/22/2014   Personal history of disease of skin and subcutaneous tissue 11/22/2014   REFERRING DIAG: CVA   THERAPY DIAG:  Muscle weakness (generalized)   Other lack of coordination   Rationale for Evaluation and Treatment Rehabilitation   SUBJECTIVE:   SUBJECTIVE STATEMENT:  Pt. Reports receiving good news  at her orthopedic appointment.  Pt accompanied by: significant other   PERTINENT HISTORY: Patient is an 83 year-old female who was admitted to [facility] on 11/23/2022 with a cerebellar CVA. 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 Pt. Was admitted to Inpatient Rehab from 12/21-1/02/2023. 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  Patient underwent inpatient rehabilitation for 2 weeks.  Patient was assessed and was scheduled for a knee replacement on 08/29/2022 however had to cancel it due to having had a CVA.  Patient had a recent fall 2 days after discharging from inpatient rehab. Past Medical History includes: Knee replacement, essential HTN, hypokalemia, leukocytosis, seizures, positive anti-- CCP test, anxiety disorder, mini strokes.  Patient had shingles with left eye nerve pain s/p 1 year ago.    PRECAUTIONS: Fall    WEIGHT BEARING RESTRICTIONS No   PAIN:  Are you having pain?  No pain currently   FALLS: Has patient fallen in last 6 months? Yes. Number of falls 1   LIVING ENVIRONMENT: Lives with: Lives with Spouse Lives in: House/apartment Stairs: 2 storey home, resides on the first floor.  External: 2 stairs front no rails, and 6 in back with rails Has following equipment at home: Single point cane, Walker - 2 wheeled, Environmental Consultant - 4 wheeled, Shower bench, and bed side commode   PLOF: Independent   PATIENT GOALS  To Regain the use of her left arm   OBJECTIVE:    HAND DOMINANCE: Right   ADLs: Overall ADLs: Husband assists pt. as needed Transfers/ambulation related to ADLs:Pt. Uses a 3 wheeled walker with Husband assist. Eating: Pt. Is independent with the right hand. Pt.  has difficulty cutting food. Grooming: Pt. is using her right hand, however has difficulty sustaining her LUE in elevation to assist with haircare. UB Dressing: Pt. Is independent donning a pullover shirt, and button down shirt. Has difficulty with buttoning, LB Dressing:  Independent donning pants, and socks. Difficulty tying shoes. Toileting: Independent Bathing: Pt. Is able to engage her right hand. Tub Shower transfers: Supervision Equipment: See above for equipment    IADLs: Shopping:  Has not had the opportunity for grocery shopping yet Light housekeeping: Husband is assisting with light house keeping Meal Prep:  Dependent Community mobility: Relies of family/friends Medication management: Husband assisting with weekly pillbox set-up, and administering medication. Financial management: TBD Handwriting: 75% legible   MOBILITY STATUS: Hx of falls   POSTURE COMMENTS:  No Significant postural limitations Sitting balance: supported sitting balance WFL   ACTIVITY TOLERANCE: Activity tolerance:  Fatigues in greater than 30 min.    FUNCTIONAL OUTCOME MEASURES: FOTO: 57   UPPER EXTREMITY ROM      Active ROM  Right Eval: WFL Left eval Left  01/22/23 Left  03/03/23 Left  04/14/23 Left  05/28/2023 Left 07/23/2023 Left 08/20/2023 Left 09/17/23 Left 10/29/23 Left 10/29/23  Shoulder flexion   132 100 108 108 108 60(70) 70(78) 70(102) 82(110) 87(110)  Shoulder abduction   80 85 85 85 85 65(78) 72(85) 73(94) 75(98) 75(103)  Shoulder adduction               Shoulder extension               Shoulder internal rotation               Shoulder external rotation               Elbow flexion   140 140 [facility] WFL  Elbow extension   WNL WNL [facility] WFL WFL WFL FL  Wrist flexion   65 68          Wrist extension   -10 20 24  32 32 -10 20 20 26  32  Wrist ulnar deviation     12 10 14 14 10 18 18 20 18   Wrist radial deviation     8 14 14 16 12 14 14 14 20   Wrist pronation               Wrist supination               (Blank rows = not tested)   Left digit flexion to [facility]: 2nd: 0cm, 3rd: 0cm, 4th: 0cm, 5th: 0cm   Limited Left full 2nd digit extension     UPPER EXTREMITY MMT:      MMT Right Eval: 4+/5 overall Right 07/23/2023 Left Eval Left 01/22/23 Left 03/03/23 Left  04/14/2023 Left 05/28/2023 Left 07/23/2023 Left 08/20/2023 Left 09/17/23 Left  10/29/23 Left 10/14/24  Shoulder flexion   3+/5 3/5 3-/5 3-/5 3-/5 3-/5 2/5 2+/5 2+/5 3-/5 3-/5  Shoulder abduction   3+/5 3-/5 3-/5 3-/5 3-/5 3-/5 2/5 2+/5 2+/5 3-/5 3-/5  Shoulder adduction                Shoulder extension                Shoulder internal rotation                Shoulder external rotation                Middle  trapezius                Lower trapezius                Elbow flexion   4/5 3+/5 4/5 N/A 4/5 4/5 3+/5 3+/5 3+/5 3+/5 4-/5  Elbow extension   4/5 3+/5 4/4 N/A 4/5 4/5 3+/5 3+/5 4-/5 4-/5 4/5  Wrist flexion                Wrist extension   4/5 2-/5 3-/5 3-/5 3/5 3+/5 3-/5 3+/5 3+/5 3+/5 3+/5  Wrist ulnar deviation                Wrist radial deviation                Wrist pronation                Wrist  supination                (Blank rows = not tested)   HAND FUNCTION: Grip strength: Right: 26#, Left: 10# Pinch strength: Right 8#, Left: 3#, 3 Pt. Pinch strength: Right: 9#, L: 2#  01/22/2023 Grip strength: Right: 26#, Left: 12# Pinch strength:  Pinch meter used at the initial eval has been sent out for recalibration   03/03/2023 Grip strength: Right: 26#, Left: 13# Pinch strength:  Pinch meter used at the initial eval has been sent out for recalibration  04/14/2023: Grip strength: Right: 26#, Left: 13# Pinch strength:   Right 8#, Left: 4#, 3 Pt. Pinch strength: Right: 9#, L: 3#    05/28/2023: Grip strength: Right: 26#, Left: 13# Pinch strength:   Right 8#, Left: 2#, 3 Pt. Pinch strength: Right: 9#, L: 4#  07/23/2023  Grip strength: Right: 5#, Left: 2# Pinch strength:   Right 6#, Left: 2#, 3 Pt. Pinch strength: Right: 5#, L: 2#  08/20/2023:    Grip strength: Right: 8#, Left: 4# Pinch strength:   Right 6#, Left: 2#, 3 Pt. Pinch strength: Right: 5#, L: 2#    09/17/23:  Grip strength: Right: 5#, Left: 4# Pinch strength:   Right 6#, Left: 2#, 3 Pt. Pinch strength: Right: 5#, L: 2#  10/28/2024  Grip strength: Right: 13#, Left: 6# Pinch strength:   Right 6#, Left: 2#, 3 Pt. Pinch strength: Right: 5#, L: 2#  12/15/2023:  Grip strength: Right: 13#, Left:10# Pinch strength:  Right 6#, Left: 0#, 3 Pt. Pinch strength: Right: 5#, L: 4#     COORDINATION: Right: 22 sec., Left: <5 min. To place 7 pegs with increased compensation proximally in the trunk, and through reflexive associated reactions.  01/22/23 Right: 22 sec., Left: 3 min. & 4 sec.  03/03/23 Right: 22 sec., Left: 1 min. & 39 sec.  04/14/23   TBD  05/28/23 Right: 22 sec., Left:  5 min.  07/23/2023  Right: 33 sec., Left:  Pt. Is unable to grasp, and place pegs into the pegboard. Pt. Was able to remove 9 vertical pegs in 34 sec.  08/20/2023:  Right: 27 sec., Left:  5 pegs placed in 5 min.; Pt. Was able to remove 9  vertical pegs in 28 sec.  09/17/23:  Right: 26 sec., Left:  3 min, & 49 sec.    10/28/2024:  Right: 26 sec., Left:  2 min, & 10 sec.  12/15/23  Right: 26 sec., Left:  4 min, & 8 sec.  SENSATION: Light touch: WFL, proprioceptive awareness: Intact   EDEMA: N/A   MUSCLE TONE:  LUE: Hypotonic   COGNITION: Overall cognitive status: WFL for tasks assessed. Pt. Is impulsive at times.   VISION: Subjective report: Pt. report having shingles affecting left eye  s/p 1 year. Has nerve pain Baseline vision: Wears glasses for reading only Visual history:  updated see clinical impression   VISION ASSESSMENT:    WFL for tasks performed   PERCEPTION: Intact   PRAXIS: Impaired: Motor planning   OBSERVATIONS:  Pt. more alert, and engaging since prior to the most recent hospitalization.    TODAY'S TREATMENT:    Therapeutic ex.:  Pt. Worked on BUE strengthening, and reciprocal motion using the UBE while seated for 10 min. with no resistance.    Neuromuscular re-education:  Pt. worked on grasping, and manipulating 1/2 washers from a magnetic dish using a 2 point grasp pattern. Pt. worked on reaching up, stabilizing, and sustaining shoulder elevation while placing the washer over a small precise target on vertical dowels positioned at various angles.   Pt. Worked on using her left hand to grasp, and store 1 washer at a time in the palm of her hand.        PATIENT EDUCATION: Education details: LUE therapeutic exercise Person educated: Patient and Spouse Education method: Medical Illustrator Education comprehension: verbalized understanding, returned demonstration, and needs further education   HOME EXERCISE PROGRAM:   Reviewed activities at home to promote isolated 2nd digit extension.    GOALS: Goals reviewed with patient? Yes   SHORT TERM GOALS: Target date: 12/10/2023   1. Patient will be independent with home exercise program for the left upper  extremity Baseline: 12/15/2023: Independent. 10/29/2023: Independent 09/17/23: Independent 08/20/23: Pt. continues to require assist from he husband for HEPs 07/23/2023: Pt. continues to require assist. 05/28/2023: Pt. Requires assist 04/09/2023: Pt. Continues to consistently attempt to engage her hand at hand 02/01/2023: Pt. Consistently attempts to perform HEPs independently. No current home exercise program Goal status: Ongoing   LONG TERM GOALS: Target date:  01/21/2024  1.  Patient will improve left shoulder strength by 2 mm grades to be able to sustain UE's in elevation long enough to wash her hair.  Baseline: 12/15/2023:  Left shoulder flexion 3-/5, abduction: 3-/5 10/29/2023: Left shoulder flexion 3-/5, abduction: 3-/5 10/27/2023:  Pt. Is improving with reaching up to reach her hair for hair care. Pt. Continues to have difficulty sustaining the UE in elevation long enough to perform hair care. 09/17/2023: Left shoulder 2+/5, abduction 2+/5 08/20/2023: Left shoulder flexion 2+/5, abduction 2+/5  07/23/2023: Left shoulder flexion: 2/5, abduction: 2/5. 05/28/2023: Left shoulder flexion: 3-/5, abduction: 3-/5. Pt. has an old left shoulder injury limiting progression with left shoulder strength 05/26/2023: Pt. continues to present with limitiations in sustaining LUE elevation long enough to perform hair care thoroughly 04/09/2023: Pt. is limited with sustaining LUE elevation long enough to perform hair care thoroughly. 03/03/2023: Left shoulder flexion: 3-/5, abduction: 3-/5 01/22/2023: Left shoulder flexion: 3-/5, abduction: 3-/5 Eval: Left shoulder flexion: 3/5, abduction: 3-/5 Goal status: Ongoing   2.  Patient will improve left shoulder active abduction to be able to comb her hair Baseline: 12/15/2023: 75(103) Pt. Has difficulty using her left hand to reach the right side, and back of her hair. 10/29/2023: Left shoulder abduction: 75(98) 10/27/2023: Pt. Is improving with reaching the left side of her hair for  haircare. Pt. Has difficulty reaching with the left side to the back, and right side of her head.09/17/2023: 73(94) Pt. is able to reach her left side, and back  of hair. 9/11/09/2023: 72(85) Pt. is able to reach her left side, and back of hair. 07/23/2023: Left shoulder flexion: 60(70) abduction: 65(78) 05/28/2023: left shoulder flexion: 108 abduction: 85 Pt. Has an old left shoulder injury limiting progression with left shoulder ROM 05/26/2023:  5/10 left shoulder pain with ROM limits using it functionally during hair care. 04/09/2023: Pt. Presents with difficulty abducting her left shoulder enough to thoroughly complete haircare. 03/03/2023: Shoulder abduction: 85 01/22/2023: Shoulder abduction: 85 Eval: Left shoulder abduction is 80(108) Goal status: Ongoing   3.  Patient will independently button her shirt with modified independence. Baseline:12/15/2023:  Pt.is engaging her left hand more, continues to have difficulty with buttoning.10/29/2023: Pt. Has difficulty manipulating buttons, will benefit from a buttonhook.10/27/23: Pt. Requires increased time to complete buttons.09/17/2023: Pt. Has a buttonhook, however does not know where it is. Pt. requires increased time to complete, and assist from her husband. 08/20/2023: Pt. requires increased time to complete, and assist from her husband. 07/23/2023 Pt. presents with difficulty buttoning her shirt. 05/28/2023: pt. Continues to work towards progressing with buttoning 05/26/2023:  Pt. Continues to progress towards buttoning. 04/09/2023: Pt. Continues to progress towards buttoning. 03/03/2023: Pt. Continues to have difficulty with buttoning. 01/22/2023: Pt. continues to have difficulty. Eval: Patient has difficulty.  Goal status: Ongoing   4.  Patient well improve left grip strength in preparation for securely hold a glass/beverage Baseline: 12/15/2023:  Grip strength: Right: 13#  Left: 10# 10/29/2023: Grip strength: Right: 13#  Left: 6#10/27/2023: Pt. Has difficulty  securely holding heavier items with larger diameter. 09/17/2023: Grip strength: Right: 5#, Left: 4# Pt. Has difficulty holding a glass. Right: 8#, Left: 4# Pt. Is able to more securely hold mirrors, and flowers with the left hand, however has difficulty holding a glass beverage, or bottle. 08/20/2023: Right: 8#, Left: 4# Pt. Is able to more securely hold mirrors, and flowers with the left hand, however has difficulty holding a glass beverage, or bottle. 07/23/2023: Grip strength: Right: 5#, Left: 2# 05/28/2023: Right: 26#, Left: 13#  pt. Presents with difficulty securely holding flowers in her left hand. 05/26/2023: TBD 04/09/2023: Pt. Is able to hold, and hike pants with the left hand, continues to have difficulty with securely holding flowers.   03/03/2023: left grip strength: 13# 01/22/2023: Left: 12# Eval: Pt. Is unable to securely hold flowers. Goal status: Ongoing   5.  Pt. will independently recall adaptive strategies for performing ADL/ADL kitchen tasks.  Baseline: 12/15/2023: Independent 10/29/2023: Continue 11/19/204: Continue 08/20/2023: Revised to include IADL kitchen tasks.  07/23/2023: Continue 05/28/2023: Pt. Needs continued education about adaptive strategies.05/26/2023: Continue 04/09/2023: Continue3/26/2024: Continue 01/22/2023: Pt. continues to benefit from education about adaptive strategies during ADLs, and IADLs. Eval: Pt. to be provided with adaptive strategies. Goal status: Ongoing   6.  Pt. will improve FOTO score by 2 points to reflect patient perceived performance improvement assessment specific ADLs  and IADLs Baseline: 8/92/7974: 73 10/29/2023: 66 10/27/2023: TBD 09/17/2023: FOTO score: 61 07/23/2023: FOTO score: 57 05/28/2023: FOTO score 59 05/26/2023: TBD 5/02/204:  TBD 03/03/23: FOTO 62 Eval: 57 Goal status: Ongoing  7.  Pt. will improve left hand coordination skills in order to be able to  manipulate small objects.    Baseline: 12/15/2023: 4 min. & 8 sec. 10/29/2023 Left: 2 min. & 10 sec.  10/27/2023: Pt. Is improving with manipulating small objects.09/17/2023: Right: 26 sec., Left:  3 min, & 49 sec. pegs placed in 5 min.; Pt. Was able to remove 9 vertical pegs in 24  sec. 08/20/2023: Right: 27 sec., Left:  5 pegs placed in 5 min.; Pt. Was able to remove 9 vertical pegs in 28 sec. Pt. Is able to hold, and sort utensils in a drawer. Pt. Has difficulty manipulating small objects. 07/23/2023: Right: 33 sec., Left:  Pt. Is unable to grasp, and place pegs into the pegboard. Pt. Was able to remove 9 vertical pegs in 34 sec. 05/28/2023: Left:  5 min. 05/26/2023 Pt. Continues to present with difficulty handling and sorting utensils. 04/14/2023: 56 04/09/2023: TBD 03/03/23: 1 min. & 39 01/22/2022: Left: 3 min. & 4 sec. Eval: Pt. has difficulty sorting, and placing utensils with the left hand. Left FMC : >5 min. For 7 pegs on the 9 hole peg test.    Goal Status: Ongoing  8. Pt. will improve active left 2nd digit extension to be able able to isolate her 2nd digit in preparation for pressing/pushing buttons on appliances, phones, or remotes. Baseline: 12/15/2023: Pt. Continues to improve with 2nd digit isolation, to however continues to present with limited prolonged 2nd digit extension 10/29/2023: Pt. Is improving with 2nd digit isolation, to however continues to present with limited prolonged 2nd digit extension11/19/2024: Pt. Is improving with 2nd digit isolation, to however presents with limited prolonged 2nd digit extension. 09/17/2023: Pt. Presents with limited isolated 2nd digit extension with increased time. 08/20/2023: Pt. Is more consistently able to isolate 2nd digit extension with increased time. 07/23/2023: Pt. Continues to work on consistency with isolating left 2nd digit extension 05/28/2023: Pt. Continues to improve with left 2nd digit extension 05/26/2023: Improving with left 2nd digit extension. 04/09/2023: Pt. Is progressing with isolating left 2nd digit extension, however continues to present with  limited increased flexor tone. 03/03/23: Pt. Continues to work on improving consistency with 2nd digit extension to press the remote. 01/22/2023: is able to perform full digit extension, although 2nd digit is slow to extend s 2/2 flexor tone. Pt.  Eval: Pt. is able to is unable to actively perform full digit extension Goal status: Ongoing  9: Pt. Will cut food with modified independence Baseline: 12/15/2023: Pt. continues to have difficulty cutting food on her plate 88/78/7975: : Pt. continues to have difficulty cutting food on her plate.10/27/2023: Pt. Continues to have difficulty cutting food on her plate. 09/17/2023: Pt. Is unable to cut the food on her plate, as well as cut food for meal preparation. 08/20/2023: Pt. is unable to cut the food on her plate, as well as cut food for meal preparation.    Goal Status: Ongoing   ASSESSMENT:   CLINICAL IMPRESSION:  Pt. Reports having had a great orthopedic follow-up appointment. Pt. reports that her orthopedic surgeon is going to schedule her to have her knee replaced. Pt. Reports being surprised, and happy. Pt./caregiver plan to reduce the frequency of therapy to 1x a week until she has her surgery. Pt. presents with difficulty storing multiple washers in the palm of her hand. Pt. was able to store one washer at a time, however initially dropped washers from the ulnar aspect of her hand. However, this improved as the task progressed. Pt. continues to work on improving LUE ROM, strength, FMC, and hand function skills in order to work towards improving, and maximizing independence with ADLs, and IADL tasks.   PERFORMANCE DEFICITS in functional skills including ADLs, IADLs, coordination, proprioception, ROM, strength, FMC, and GMC, cognitive skills including memory, and psychosocial skills including coping strategies, environmental adaptation, interpersonal interactions, and routines and behaviors.    IMPAIRMENTS are limiting  patient from ADLs, IADLs,  education, leisure, and social participation.    COMORBIDITIES may have co-morbidities  that affects occupational performance. Patient will benefit from skilled OT to address above impairments and improve overall function.   MODIFICATION OR ASSISTANCE TO COMPLETE EVALUATION: Min-Moderate modification of tasks or assist with assess necessary to complete an evaluation.   OT OCCUPATIONAL PROFILE AND HISTORY: Detailed assessment: Review of records and additional review of physical, cognitive, psychosocial history related to current functional performance.   CLINICAL DECISION MAKING: Moderate - several treatment options, min-mod task modification necessary   REHAB POTENTIAL: Good   EVALUATION COMPLEXITY: Moderate      PLAN: OT FREQUENCY: 2x/week   OT DURATION: 12 weeks   PLANNED INTERVENTIONS: self care/ADL training, therapeutic exercise, therapeutic activity, neuromuscular re-education, manual therapy, passive range of motion, functional mobility training, electrical stimulation, and paraffin   RECOMMENDED OTHER SERVICES: PT   CONSULTED AND AGREED WITH PLAN OF CARE: Patient and family member/caregiver   PLAN FOR NEXT SESSION: see above   Richardson Otter, MS, OTR/L  12/17/23, 11:04 PM

## 2023-12-17 NOTE — Therapy (Signed)
 OUTPATIENT PHYSICAL THERAPY TREATMENT    Patient Name: [name] MRN: [id number] DOB:[birth year], 83 y.o., female Today's Date: 12/17/2023   PCP: [name], D MD REFERRING PROVIDER: Maurice Sharlet RAMAN Castillo   END OF SESSION:  PT End of Session - 12/17/23 1644     Visit Number 25    Number of Visits 31    Date for PT Re-Evaluation 12/24/23    Authorization Type [employer] Medicare:    Authorization Time Period approved for 24 additional tx from 09/28/23 to 12/24/23; total visit count 31    Progress Note Due on Visit 30    PT Start Time 1615    PT Stop Time 1700    PT Time Calculation (min) 45 min    Equipment Utilized During Treatment Gait belt    Activity Tolerance Patient tolerated treatment well    Behavior During Therapy WFL for tasks assessed/performed                  Past Medical History:  Diagnosis Date   Anemia    Arthritis    Asthma    uses inhaler just prior to surgery to avoid attack   Back pain    from previous injury   Complication of anesthesia    has woken  up during 2 different surgery   Depression    no current issue/treatment; situation   Gallstones    GERD (gastroesophageal reflux disease)    Hiatal hernia    patient does NOT have nerve/muscle disease   History of kidney stones    HLD (hyperlipidemia)    HTN (hypertension)    Hypothyroidism    Kidney stones    Knee pain    Nausea and vomiting 10/15/2022   Non-diabetic pancreatic hormone dysfunction years   pt. states pancreas does not function properly   Pancreatitis    Pneumonia    Seizures (HCC)    caused by dye injected during a procedure   Shortness of breath    with exertion   Sinus problem    frequent infections/congestion   Stroke (HCC) 2021   reports having CVA in 2021 and having mini strokes before that   Thyroid  disease    Past Surgical History:  Procedure Laterality Date   ABDOMINAL HYSTERECTOMY     APPENDECTOMY     CARPAL TUNNEL RELEASE  10+ years ago    bilateral   EYE SURGERY  3 yrs ago   bilateral cataracts   FOOT OSTEOTOMY  6 weeks ago   Left foot: great, 2nd & 3rd   FOOT OSTEOTOMY  5 years ago   Right great toe   HAND SURGERY Bilateral 2011-most recent   multiple hand surgeries, 2 on left, 3 on right   KNEE ARTHROPLASTY Right 04/28/2022   Procedure: COMPUTER ASSISTED TOTAL KNEE ARTHROPLASTY;  Surgeon: Mardee Lynwood SQUIBB, MD;  Location: [facility];  Service: Orthopedics;  Laterality: Right;   LOOP RECORDER INSERTION N/A 05/16/2020   Procedure: LOOP RECORDER INSERTION;  Surgeon: Ammon Blunt, MD;  Location: [facility];  Service: Cardiovascular;  Laterality: N/A;   NASAL SINUS SURGERY  most recent 7-8 yrs ago   7 sinus surgeries    TRIGGER FINGER RELEASE  11/19/2011   Procedure: RELEASE TRIGGER FINGER/A-1 PULLEY;  Surgeon: Arley JONELLE Curia, MD;  Location: [facility];  Service: Orthopedics;  Laterality: Right;  release a-1 pulley right index finger and cyst removal   WRIST GANGLION EXCISION  1980's   right  ONSET DATE: 11/23/22  REFERRING DIAG: TIA  THERAPY DIAG:  Muscle weakness (generalized)  Dizziness and giddiness  Other lack of coordination  Unsteadiness on feet  Difficulty in walking, not elsewhere classified  Rationale for Evaluation and Treatment: Rehabilitation  SUBJECTIVE:                                                                                                                                                                                             SUBJECTIVE STATEMENT:  Pt reports that she is doing well. Reports that they were seen by Dr Glenys office this morning, and will be put on his schedule for L TKA in the near future. Wanting to reduce PT to once per week until knee replacement.     Pt accompanied by: significant other  PERTINENT HISTORY: [name] is an 83 year-old female who was admitted to [facility] on 11/23/2022 with a cerebellar CVA. 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 Pt. Was admitted to Inpatient Rehab from 12/21-1/02/2023. 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  Patient underwent inpatient rehabilitation for 2 weeks.  Patient was assessed and was scheduled for a knee replacement on 08/29/2022 however had to cancel it due to having had a CVA. PMH includes: anemia, arthritis, back pain, depression, hernia, GERD, HLD, HTN, hypothyroidism, knee pain, pancreatic hormone dysfunction, pneumonia, seizures, stroke, thyroid  disease.  Patient has a donjoy brace for L knee.   PAIN:  Are you having pain? no  PRECAUTIONS: Fall, has latex allergy  WEIGHT BEARING RESTRICTIONS: No  FALLS: Has patient fallen in last 6 months? Yes. Number of falls 4 falls   LIVING ENVIRONMENT: Lives with: lives with their family and lives with their spouse Lives in: House/apartment Stairs: Yes: Internal: flight steps;   and External: 2 steps; on right going up and on left going up Has following equipment at home: Single point cane, Walker - 2 wheeled, Shower bench, and bed side commode  PLOF: Independent  PATIENT GOALS: to have less pain and move better  OBJECTIVE:   TODAY'S TREATMENT: DATE: 12/17/23 Gait belt donned throughout for pt safety. PT provided CGA-min a throughout session.  Nustpep x 6 min AAROM, level 1-3 with cues for SPM >50. Reports mild L shoulder pain limiting full shoulder flexion.   Gait with RW in rehab gym x 48ft x 3 with CGA-supervision assist. Cues still for attention to the LUE initially for safe RW use. No pain reported by pt with gait, but states that BLE are very tired today.   Sit<>stand 4x 5 throughout session with cues for  BLE position for L knee pain management.   Standing on airex pad:  Normal BOS 3 x 30 sec  Semitandem 3 x 15 bil  Eyes closed 3 x 10sec  Blaze pods to hit with BUE; random, 2 colors, RUE Red, LUE green. X 3 bouts 45 sec. 22,  33, 31. Min assist to prevent L and posterior LOB      PATIENT EDUCATION: Education details: Pt educated throughout session about proper posture and technique with exercises. Improved exercise technique, movement at target joints, use of target muscles after min to mod verbal, visual, tactile cues.   HOME EXERCISE PROGRAM:  Access Code: [email or web address] URL: [email or web address] Date: 10/07/2023 Prepared by: Massie Dollar  Exercises - Standing March with Counter Support  - 1 x daily - 3 x weekly - 2 sets - 10 reps - Standing Hip Abduction with Unilateral Counter Support  - 1 x daily - 3 x weekly - 2 sets - 10 reps - 2 hold - Standing Hip Extension with Unilateral Counter Support  - 1 x daily - 3 x weekly - 2 sets - 10 reps - 2 hold - Supine Straight Leg Raises  - 1 x daily - 37 x weekly - 2 sets - 10 reps - Bridge  - 1 x daily - 3 x weekly - 2 sets - 10 reps - 2 hold - Supine Heel Slide  - 1 x daily - 3 x weekly - 2 sets - 10 reps  GOALS: Goals reviewed with patient? Yes  SHORT TERM GOALS: Target date: 09/19/23  Patient will be independent in starter HEP to improve A/ROM tolerance and preserve available ROM of LLE and to advance A/ROM and strength of RLE.  Baseline: 08/20/23: defer to vists 2-3  Goal status: NEW  LONG TERM GOALS: Target date: 10/20/23  Patient will increase FOTO score to equal to or greater than 65%  to demonstrate statistically significant improvement in ease of mobility.  Baseline:  47%; 06/23/23: 60;  7/16: 60%  10/31: 54 11/19/23: 56 Goal status: PROGRESSING   2.  Patient (> 89 years old) will complete five times sit to stand test in < 15 seconds indicating an increased RLE power.  Baseline: 5/7: unable to tolerate 7/16: 17.4 sec 10/31: 11.02 sec 11/19/23: 13.06 sec Goal status: PROGRESSING   3.   Patient to report tolerance of ad lib AMB in home during the day of distances >42ft >5x at supervision level or better to improve independence with  ADL.    Baseline: 08/20/23: using transport chair for nearly all mobility, not tolerating walking due to WB limitations of LLE  10/31: able to ambulate 30-41ft with CGA from PT.  11/19/23: Pt able to ambulate 255.3' with CGA, FWW, and chair follow Goal status: PROGRESSING      ASSESSMENT:  CLINICAL IMPRESSION:   Pt put forth excellent effort throughout treatment. PT treatment focused on increased time in standing and dynamic balance training. Pt required min assist for balance on airex pad with dual task, but reduced assist with gait training on this day. No supine rest breaks required following gait training on this day.  Pt will continue to benefit from skilled therapy to address remaining deficits in order to improve overall QoL and return to PLOF.     OBJECTIVE IMPAIRMENTS: Abnormal gait, cardiopulmonary status limiting activity, decreased activity tolerance, decreased balance, decreased cognition, decreased endurance, decreased knowledge of use of DME, decreased mobility, difficulty walking, decreased ROM, decreased strength, decreased safety awareness, hypomobility, increased  fascial restrictions, impaired perceived functional ability, impaired flexibility, impaired UE functional use, improper body mechanics, postural dysfunction, and pain.   ACTIVITY LIMITATIONS: carrying, lifting, bending, sitting, standing, squatting, stairs, transfers, bed mobility, bathing, toileting, dressing, reach over head, hygiene/grooming, locomotion level, and caring for others  PARTICIPATION LIMITATIONS: meal prep, cleaning, laundry, medication management, personal finances, interpersonal relationship, driving, shopping, community activity, yard work, school, and church  PERSONAL FACTORS: Age, Behavior pattern, Education, Fitness, Past/current experiences, Time since onset of injury/illness/exacerbation, and 3+ comorbidities: anemia, arthritis, back pain, depression, hernia, GERD, HLD, HTN, hypothyroidism, knee  pain, pancreatic hormone dysfunction, pneumonia, seizures, stroke, thyroid  disease  are also affecting patient's functional outcome.   REHAB POTENTIAL: Fair    CLINICAL DECISION MAKING: Evolving/moderate complexity  EVALUATION COMPLEXITY: Moderate  PLAN:  PT FREQUENCY: 1-2x/week  PT DURATION: 10 weeks  PLANNED INTERVENTIONS: Therapeutic exercises, Therapeutic activity, Neuromuscular re-education, Balance training, Gait training, Patient/Family education, Self Care, Joint mobilization, Joint manipulation, Stair training, Vestibular training, Canalith repositioning, Visual/preceptual remediation/compensation, Orthotic/Fit training, DME instructions, Electrical stimulation, Wheelchair mobility training, Spinal mobilization, Cryotherapy, Moist heat, Compression bandaging, scar mobilization, Splintting, Taping, Traction, Ultrasound, Manual therapy, and Re-evaluation   PLAN FOR NEXT SESSION:  Tolerance to standing in functional movement/ tasks.  Continue BLE strengthening in weighted and unweighted position Further vestibular assessment  Massie Dollar PT, DPT  Physical Therapist - [facility]  [facility]  4:45 PM 12/17/23

## 2023-12-22 ENCOUNTER — Ambulatory Visit: Payer: Medicare HMO | Admitting: Occupational Therapy

## 2023-12-22 ENCOUNTER — Ambulatory Visit: Payer: Medicare HMO | Admitting: Physical Therapy

## 2023-12-22 DIAGNOSIS — M6281 Muscle weakness (generalized): Secondary | ICD-10-CM | POA: Diagnosis not present

## 2023-12-22 DIAGNOSIS — R278 Other lack of coordination: Secondary | ICD-10-CM | POA: Diagnosis not present

## 2023-12-22 DIAGNOSIS — R2681 Unsteadiness on feet: Secondary | ICD-10-CM

## 2023-12-22 DIAGNOSIS — R531 Weakness: Secondary | ICD-10-CM

## 2023-12-22 DIAGNOSIS — R262 Difficulty in walking, not elsewhere classified: Secondary | ICD-10-CM

## 2023-12-22 DIAGNOSIS — R42 Dizziness and giddiness: Secondary | ICD-10-CM | POA: Diagnosis not present

## 2023-12-22 DIAGNOSIS — I6381 Other cerebral infarction due to occlusion or stenosis of small artery: Secondary | ICD-10-CM | POA: Diagnosis not present

## 2023-12-22 NOTE — Therapy (Signed)
 OUTPATIENT PHYSICAL THERAPY TREATMENT    Patient Name: [name] MRN: [id number] DOB:[birth year], 83 y.o., female Today's Date: 12/22/2023   PCP: [name], D MD REFERRING PROVIDER: Maurice Sharlet RAMAN Castillo   END OF SESSION:  PT End of Session - 12/22/23 1620     Visit Number 26    Number of Visits 31    Date for PT Re-Evaluation 12/24/23    Authorization Type [employer] Medicare:    Authorization Time Period approved for 24 additional tx from 09/28/23 to 12/24/23; total visit count 31    Progress Note Due on Visit 30    PT Start Time 1620    PT Stop Time 1700    PT Time Calculation (min) 40 min    Equipment Utilized During Treatment Gait belt    Activity Tolerance Patient tolerated treatment well    Behavior During Therapy WFL for tasks assessed/performed                  Past Medical History:  Diagnosis Date   Anemia    Arthritis    Asthma    uses inhaler just prior to surgery to avoid attack   Back pain    from previous injury   Complication of anesthesia    has woken  up during 2 different surgery   Depression    no current issue/treatment; situation   Gallstones    GERD (gastroesophageal reflux disease)    Hiatal hernia    patient does NOT have nerve/muscle disease   History of kidney stones    HLD (hyperlipidemia)    HTN (hypertension)    Hypothyroidism    Kidney stones    Knee pain    Nausea and vomiting 10/15/2022   Non-diabetic pancreatic hormone dysfunction years   pt. states pancreas does not function properly   Pancreatitis    Pneumonia    Seizures (HCC)    caused by dye injected during a procedure   Shortness of breath    with exertion   Sinus problem    frequent infections/congestion   Stroke (HCC) 2021   reports having CVA in 2021 and having mini strokes before that   Thyroid  disease    Past Surgical History:  Procedure Laterality Date   ABDOMINAL HYSTERECTOMY     APPENDECTOMY     CARPAL TUNNEL RELEASE  10+ years ago    bilateral   EYE SURGERY  3 yrs ago   bilateral cataracts   FOOT OSTEOTOMY  6 weeks ago   Left foot: great, 2nd & 3rd   FOOT OSTEOTOMY  5 years ago   Right great toe   HAND SURGERY Bilateral 2011-most recent   multiple hand surgeries, 2 on left, 3 on right   KNEE ARTHROPLASTY Right 04/28/2022   Procedure: COMPUTER ASSISTED TOTAL KNEE ARTHROPLASTY;  Surgeon: Stephanie Lynwood SQUIBB, MD;  Location: [facility];  Service: Orthopedics;  Laterality: Right;   LOOP RECORDER INSERTION N/A 05/16/2020   Procedure: LOOP RECORDER INSERTION;  Surgeon: Stephanie Blunt, MD;  Location: [facility];  Service: Cardiovascular;  Laterality: N/A;   NASAL SINUS SURGERY  most recent 7-8 yrs ago   7 sinus surgeries    TRIGGER FINGER RELEASE  11/19/2011   Procedure: RELEASE TRIGGER FINGER/A-1 PULLEY;  Surgeon: Stephanie JONELLE Curia, MD;  Location: [facility];  Service: Orthopedics;  Laterality: Right;  release a-1 pulley right index finger and cyst removal   WRIST GANGLION EXCISION  1980's   right  ONSET DATE: 11/23/22  REFERRING DIAG: TIA  THERAPY DIAG:  Muscle weakness (generalized)  Dizziness and giddiness  Other lack of coordination  Unsteadiness on feet  Difficulty in walking, not elsewhere classified  Acute left-sided weakness  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HCC)  Rationale for Evaluation and Treatment: Rehabilitation  SUBJECTIVE:                                                                                                                                                                                             SUBJECTIVE STATEMENT:   Pt reports that she is doing well. Mild pain in the L UE, but it improved with OT appointment prior to PT.     Pt accompanied by: significant other  PERTINENT HISTORY: [name] is an 83 year-old female who was admitted to [facility] on 11/23/2022 with a cerebellar CVA. 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 Pt.  Was admitted to Inpatient Rehab from 12/21-1/02/2023. 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  Patient underwent inpatient rehabilitation for 2 weeks.  Patient was assessed and was scheduled for a knee replacement on 08/29/2022 however had to cancel it due to having had a CVA. PMH includes: anemia, arthritis, back pain, depression, hernia, GERD, HLD, HTN, hypothyroidism, knee pain, pancreatic hormone dysfunction, pneumonia, seizures, stroke, thyroid  disease.  Patient has a donjoy brace for L knee.   PAIN:  Are you having pain? no  PRECAUTIONS: Fall, has latex allergy  WEIGHT BEARING RESTRICTIONS: No  FALLS: Has patient fallen in last 6 months? Yes. Number of falls 4 falls   LIVING ENVIRONMENT: Lives with: lives with their family and lives with their spouse Lives in: House/apartment Stairs: Yes: Internal: flight steps;   and External: 2 steps; on right going up and on left going up Has following equipment at home: Single point cane, Walker - 2 wheeled, Shower bench, and bed side commode  PLOF: Independent  PATIENT GOALS: to have less pain and move better  OBJECTIVE:   TODAY'S TREATMENT: DATE: 12/22/23   Standing at RW with single limb UE lift x 6 bil. Pt reports mild dizziness in standing on this day.   To reduce risk of nausea. PT treatment focused on BLE strengthening  SAQ with 2.5# AW  Bridge x 10  Bridge with ball squeeze x 10  Bridge with hip abduction x 10 GGTB.  Hip abduction x 12 GTB  SLR LLE only due to pain in the R hip. X10  Reciprocal hip flexion with knee fleixon x 12 bil RTB   Seated:  LAQ 2.5 # W x 12  bil  Hip flexion with abduction/adduction x 12 bil 2.5# AW.  HS curl RRB.  Sit<>stand x 10 without UE support   Gait with RW 3 x 49ft with supervision assist on this day, no pain reported in L knee, or dizziness while in standing at end of PT session.    PATIENT EDUCATION: Education details: Pt educated  throughout session about proper posture and technique with exercises. Improved exercise technique, movement at target joints, use of target muscles after min to mod verbal, visual, tactile cues.   HOME EXERCISE PROGRAM:  Access Code: [email or web address] URL: [email or web address] Date: 10/07/2023 Prepared by: Massie Dollar  Exercises - Standing March with Counter Support  - 1 x daily - 3 x weekly - 2 sets - 10 reps - Standing Hip Abduction with Unilateral Counter Support  - 1 x daily - 3 x weekly - 2 sets - 10 reps - 2 hold - Standing Hip Extension with Unilateral Counter Support  - 1 x daily - 3 x weekly - 2 sets - 10 reps - 2 hold - Supine Straight Leg Raises  - 1 x daily - 37 x weekly - 2 sets - 10 reps - Bridge  - 1 x daily - 3 x weekly - 2 sets - 10 reps - 2 hold - Supine Heel Slide  - 1 x daily - 3 x weekly - 2 sets - 10 reps  GOALS: Goals reviewed with patient? Yes  SHORT TERM GOALS: Target date: 09/19/23  Patient will be independent in starter HEP to improve A/ROM tolerance and preserve available ROM of LLE and to advance A/ROM and strength of RLE.  Baseline: 08/20/23: defer to vists 2-3  Goal status: NEW  LONG TERM GOALS: Target date: 10/20/23  Patient will increase FOTO score to equal to or greater than 65%  to demonstrate statistically significant improvement in ease of mobility.  Baseline:  47%; 06/23/23: 60;  7/16: 60%  10/31: 54 11/19/23: 56 Goal status: PROGRESSING   2.  Patient (> 90 years old) will complete five times sit to stand test in < 15 seconds indicating an increased RLE power.  Baseline: 5/7: unable to tolerate 7/16: 17.4 sec 10/31: 11.02 sec 11/19/23: 13.06 sec Goal status: PROGRESSING   3.   Patient to report tolerance of ad lib AMB in home during the day of distances >67ft >5x at supervision level or better to improve independence with ADL.    Baseline: 08/20/23: using transport chair for nearly all mobility, not tolerating walking due to WB  limitations of LLE  10/31: able to ambulate 30-76ft with CGA from PT.  11/19/23: Pt able to ambulate 255.3' with CGA, FWW, and chair follow Goal status: PROGRESSING      ASSESSMENT:  CLINICAL IMPRESSION:   Pt put forth excellent effort throughout treatment. PT treatment focused on increased BLE strengthening and improved ROM in Bil hip and knee in pain free range. Tolerated gait training well at end of PT treatment without pain or dizziness on this day.Pt will continue to benefit from skilled therapy to address remaining deficits in order to improve overall QoL and return to PLOF.     OBJECTIVE IMPAIRMENTS: Abnormal gait, cardiopulmonary status limiting activity, decreased activity tolerance, decreased balance, decreased cognition, decreased endurance, decreased knowledge of use of DME, decreased mobility, difficulty walking, decreased ROM, decreased strength, decreased safety awareness, hypomobility, increased fascial restrictions, impaired perceived functional ability, impaired flexibility, impaired UE functional use, improper body mechanics, postural dysfunction, and pain.   ACTIVITY LIMITATIONS:  carrying, lifting, bending, sitting, standing, squatting, stairs, transfers, bed mobility, bathing, toileting, dressing, reach over head, hygiene/grooming, locomotion level, and caring for others  PARTICIPATION LIMITATIONS: meal prep, cleaning, laundry, medication management, personal finances, interpersonal relationship, driving, shopping, community activity, yard work, school, and church  PERSONAL FACTORS: Age, Behavior pattern, Education, Fitness, Past/current experiences, Time since onset of injury/illness/exacerbation, and 3+ comorbidities: anemia, arthritis, back pain, depression, hernia, GERD, HLD, HTN, hypothyroidism, knee pain, pancreatic hormone dysfunction, pneumonia, seizures, stroke, thyroid  disease  are also affecting patient's functional outcome.   REHAB POTENTIAL: Fair    CLINICAL  DECISION MAKING: Evolving/moderate complexity  EVALUATION COMPLEXITY: Moderate  PLAN:  PT FREQUENCY: 1-2x/week  PT DURATION: 10 weeks  PLANNED INTERVENTIONS: Therapeutic exercises, Therapeutic activity, Neuromuscular re-education, Balance training, Gait training, Patient/Family education, Self Care, Joint mobilization, Joint manipulation, Stair training, Vestibular training, Canalith repositioning, Visual/preceptual remediation/compensation, Orthotic/Fit training, DME instructions, Electrical stimulation, Wheelchair mobility training, Spinal mobilization, Cryotherapy, Moist heat, Compression bandaging, scar mobilization, Splintting, Taping, Traction, Ultrasound, Manual therapy, and Re-evaluation   PLAN FOR NEXT SESSION:  Tolerance to standing in functional movement/ tasks.  Continue BLE strengthening in weighted and unweighted position Further vestibular assessment  Massie Dollar PT, DPT  Physical Therapist - [facility]  [facility]  4:21 PM 12/22/23

## 2023-12-23 NOTE — Therapy (Signed)
 Occupational Therapy Treatment Note      Patient Name: [name] MRN: [id number] DOB:1941-08-31, 83 y.o., female Today's Date: 12/23/2023  PCP: Dr. Auston REFERRING PROVIDER:  Dr. Auston  END OF SESSION:  PLAN:   OT End of Session - 12/23/23 0830     Visit Number 82    Number of Visits 120    Date for OT Re-Evaluation 03/08/24    OT Start Time 1530    OT Stop Time 1615    OT Time Calculation (min) 45 min    Activity Tolerance Patient tolerated treatment well    Behavior During Therapy WFL for tasks assessed/performed                   Past Medical History:  Diagnosis Date   Anemia    Arthritis    Asthma    uses inhaler just prior to surgery to avoid attack   Back pain    from previous injury   Complication of anesthesia    has woken  up during 2 different surgery   Depression    no current issue/treatment; situation   Gallstones    GERD (gastroesophageal reflux disease)    Hiatal hernia    patient does NOT have nerve/muscle disease   History of kidney stones    HLD (hyperlipidemia)    HTN (hypertension)    Hypothyroidism    Kidney stones    Knee pain    Nausea and vomiting 10/15/2022   Non-diabetic pancreatic hormone dysfunction years   pt. states pancreas does not function properly   Pancreatitis    Pneumonia    Seizures (HCC)    caused by dye injected during a procedure   Shortness of breath    with exertion   Sinus problem    frequent infections/congestion   Stroke (HCC) 2021   reports having CVA in 2021 and having mini strokes before that   Thyroid  disease    Past Surgical History:  Procedure Laterality Date   ABDOMINAL HYSTERECTOMY     APPENDECTOMY     CARPAL TUNNEL RELEASE  10+ years ago   bilateral   EYE SURGERY  3 yrs ago   bilateral cataracts   FOOT OSTEOTOMY  6 weeks ago   Left foot: great, 2nd & 3rd   FOOT OSTEOTOMY  5 years ago   Right great toe   HAND SURGERY Bilateral 2011-most recent   multiple hand  surgeries, 2 on left, 3 on right   KNEE ARTHROPLASTY Right 04/28/2022   Procedure: COMPUTER ASSISTED TOTAL KNEE ARTHROPLASTY;  Surgeon: Mardee Lynwood SQUIBB, MD;  Location: [facility];  Service: Orthopedics;  Laterality: Right;   LOOP RECORDER INSERTION N/A 05/16/2020   Procedure: LOOP RECORDER INSERTION;  Surgeon: Ammon Blunt, MD;  Location: [facility];  Service: Cardiovascular;  Laterality: N/A;   NASAL SINUS SURGERY  most recent 7-8 yrs ago   7 sinus surgeries    TRIGGER FINGER RELEASE  11/19/2011   Procedure: RELEASE TRIGGER FINGER/A-1 PULLEY;  Surgeon: Arley JONELLE Curia, MD;  Location: 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   HLD (hyperlipidemia) 11/23/2022  Asthma 11/23/2022   Depression with anxiety 11/23/2022   Chronic diastolic CHF (congestive heart failure) (HCC) 11/23/2022   Normocytic anemia 11/23/2022   Aspiration pneumonia (HCC) 11/23/2022   AKI (acute kidney injury) (HCC) 11/23/2022   Abdominal pain 11/23/2022   Mesenteric mass 11/23/2022   Coffee ground emesis 11/23/2022   Nausea and vomiting 10/15/2022   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   Fatigue 09/17/2022   Left hemiparesis (HCC) 09/17/2022   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HCC) 08/22/2022   GERD (gastroesophageal reflux disease) 08/21/2022   Agitation 08/20/2022   Acute left-sided weakness 08/20/2022   Expressive aphasia    Stroke (HCC) 08/19/2022   Leukocytosis 08/19/2022   History of urticaria 04/28/2022   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   Primary osteoarthritis of left knee 02/24/2022   Primary osteoarthritis of right knee 02/24/2022   Lumbar spondylolysis 04/12/2020    History of CVA (cerebrovascular accident) 03/26/2020   Low back pain radiating to right lower extremity 03/21/2020   B12 deficiency 03/06/2020   Positive anti-CCP test 12/21/2019   Arthralgia 12/13/2019   Dermatitis 12/13/2019   Rheumatoid factor positive 12/13/2019   Essential hypertension 12/11/2018   Palpitations 12/11/2018   Acquired hypothyroidism 11/10/2018   Arthritis of knee 09/17/2016   Anxiety 11/22/2014   Asthma without status asthmaticus 11/22/2014   Benign neoplasm of colon, unspecified 11/22/2014   Environmental allergies 11/22/2014   Hypertriglyceridemia 11/22/2014   Hypokalemia 11/22/2014   Personal history of disease of skin and subcutaneous tissue 11/22/2014   REFERRING DIAG: CVA   THERAPY DIAG:  Muscle weakness (generalized)   Other lack of coordination   Rationale for Evaluation and Treatment Rehabilitation   SUBJECTIVE:   SUBJECTIVE STATEMENT:  Pt. Reports  that she has to follow up with the neurologist before having her knee surgery.  Pt accompanied by: significant other   PERTINENT HISTORY: Patient is an 83 year-old female who was admitted to [facility] on 11/23/2022 with a cerebellar CVA. 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 Pt. Was admitted to Inpatient Rehab from 12/21-1/02/2023. 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  Patient underwent inpatient rehabilitation for 2 weeks.  Patient was assessed and was scheduled for a knee replacement on 08/29/2022 however had to cancel it due to having had a CVA.  Patient had a recent fall 2 days after discharging from inpatient rehab. Past Medical History includes: Knee replacement, essential HTN, hypokalemia, leukocytosis, seizures, positive anti-- CCP test, anxiety disorder, mini strokes.  Patient had shingles with left eye nerve pain s/p 1 year ago.    PRECAUTIONS: Fall   WEIGHT BEARING RESTRICTIONS No   PAIN:  Are you  having pain?  Yes, left shoulder pain NR: started this morning.   FALLS: Has patient fallen in last 6 months? Yes. Number of falls 1   LIVING ENVIRONMENT: Lives with: Lives with Spouse Lives in: House/apartment Stairs: 2 storey home, resides on the first floor.  External: 2 stairs front no rails, and 6 in back with rails Has following equipment at home: Single point cane, Walker - 2 wheeled, Environmental Consultant - 4 wheeled, Shower bench, and bed side commode   PLOF: Independent   PATIENT GOALS  To Regain the use of her left arm   OBJECTIVE:    HAND DOMINANCE: Right   ADLs: Overall ADLs: Husband assists pt. as needed Transfers/ambulation related to ADLs:Pt. Uses a 3 wheeled walker with Husband assist. Eating: Pt. Is independent with the right hand. Pt. has  difficulty cutting food. Grooming: Pt. is using her right hand, however has difficulty sustaining her LUE in elevation to assist with haircare. UB Dressing: Pt. Is independent donning a pullover shirt, and button down shirt. Has difficulty with buttoning, LB Dressing:  Independent donning pants, and socks. Difficulty tying shoes. Toileting: Independent Bathing: Pt. Is able to engage her right hand. Tub Shower transfers: Supervision Equipment: See above for equipment    IADLs: Shopping:  Has not had the opportunity for grocery shopping yet Light housekeeping: Husband is assisting with light house keeping Meal Prep:  Dependent Community mobility: Relies of family/friends Medication management: Husband assisting with weekly pillbox set-up, and administering medication. Financial management: TBD Handwriting: 75% legible   MOBILITY STATUS: Hx of falls   POSTURE COMMENTS:  No Significant postural limitations Sitting balance: supported sitting balance WFL   ACTIVITY TOLERANCE: Activity tolerance:  Fatigues in greater than 30 min.    FUNCTIONAL OUTCOME MEASURES: FOTO: 57   UPPER EXTREMITY ROM      Active ROM Right Eval: WFL  Left eval Left  01/22/23 Left  03/03/23 Left  04/14/23 Left  05/28/2023 Left 07/23/2023 Left 08/20/2023 Left 09/17/23 Left 10/29/23 Left 10/29/23  Shoulder flexion   132 100 108 108 108 60(70) 70(78) 70(102) 82(110) 87(110)  Shoulder abduction   80 85 85 85 85 65(78) 72(85) 73(94) 75(98) 75(103)  Shoulder adduction               Shoulder extension               Shoulder internal rotation               Shoulder external rotation               Elbow flexion   140 140 [facility] WFL  Elbow extension   WNL WNL [facility] WFL WFL WFL FL  Wrist flexion   65 68          Wrist extension   -10 20 24  32 32 -10 20 20 26  32  Wrist ulnar deviation     12 10 14 14 10 18 18 20 18   Wrist radial deviation     8 14 14 16 12 14 14 14 20   Wrist pronation               Wrist supination               (Blank rows = not tested)   Left digit flexion to [facility]: 2nd: 0cm, 3rd: 0cm, 4th: 0cm, 5th: 0cm   Limited Left full 2nd digit extension     UPPER EXTREMITY MMT:      MMT Right Eval: 4+/5 overall Right 07/23/2023 Left Eval Left 01/22/23 Left 03/03/23 Left  04/14/2023 Left 05/28/2023 Left 07/23/2023 Left 08/20/2023 Left 09/17/23 Left  10/29/23 Left 10/14/24  Shoulder flexion   3+/5 3/5 3-/5 3-/5 3-/5 3-/5 2/5 2+/5 2+/5 3-/5 3-/5  Shoulder abduction   3+/5 3-/5 3-/5 3-/5 3-/5 3-/5 2/5 2+/5 2+/5 3-/5 3-/5  Shoulder adduction                Shoulder extension                Shoulder internal rotation                Shoulder external rotation                Middle trapezius  Lower trapezius                Elbow flexion   4/5 3+/5 4/5 N/A 4/5 4/5 3+/5 3+/5 3+/5 3+/5 4-/5  Elbow extension   4/5 3+/5 4/4 N/A 4/5 4/5 3+/5 3+/5 4-/5 4-/5 4/5  Wrist flexion                Wrist extension   4/5 2-/5 3-/5 3-/5 3/5 3+/5 3-/5 3+/5 3+/5 3+/5 3+/5  Wrist ulnar deviation                Wrist radial deviation                Wrist pronation                Wrist supination                 (Blank rows = not tested)   HAND FUNCTION: Grip strength: Right: 26#, Left: 10# Pinch strength: Right 8#, Left: 3#, 3 Pt. Pinch strength: Right: 9#, L: 2#  01/22/2023 Grip strength: Right: 26#, Left: 12# Pinch strength:  Pinch meter used at the initial eval has been sent out for recalibration   03/03/2023 Grip strength: Right: 26#, Left: 13# Pinch strength:  Pinch meter used at the initial eval has been sent out for recalibration  04/14/2023: Grip strength: Right: 26#, Left: 13# Pinch strength:   Right 8#, Left: 4#, 3 Pt. Pinch strength: Right: 9#, L: 3#    05/28/2023: Grip strength: Right: 26#, Left: 13# Pinch strength:   Right 8#, Left: 2#, 3 Pt. Pinch strength: Right: 9#, L: 4#  07/23/2023  Grip strength: Right: 5#, Left: 2# Pinch strength:   Right 6#, Left: 2#, 3 Pt. Pinch strength: Right: 5#, L: 2#  08/20/2023:    Grip strength: Right: 8#, Left: 4# Pinch strength:   Right 6#, Left: 2#, 3 Pt. Pinch strength: Right: 5#, L: 2#    09/17/23:  Grip strength: Right: 5#, Left: 4# Pinch strength:   Right 6#, Left: 2#, 3 Pt. Pinch strength: Right: 5#, L: 2#  10/28/2024  Grip strength: Right: 13#, Left: 6# Pinch strength:   Right 6#, Left: 2#, 3 Pt. Pinch strength: Right: 5#, L: 2#  12/15/2023:  Grip strength: Right: 13#, Left:10# Pinch strength:  Right 6#, Left: 0#, 3 Pt. Pinch strength: Right: 5#, L: 4#     COORDINATION: Right: 22 sec., Left: <5 min. To place 7 pegs with increased compensation proximally in the trunk, and through reflexive associated reactions.  01/22/23 Right: 22 sec., Left: 3 min. & 4 sec.  03/03/23 Right: 22 sec., Left: 1 min. & 39 sec.  04/14/23   TBD  05/28/23 Right: 22 sec., Left:  5 min.  07/23/2023  Right: 33 sec., Left:  Pt. Is unable to grasp, and place pegs into the pegboard. Pt. Was able to remove 9 vertical pegs in 34 sec.  08/20/2023:  Right: 27 sec., Left:  5 pegs placed in 5 min.; Pt. Was able to remove 9 vertical pegs in 28  sec.  09/17/23:  Right: 26 sec., Left:  3 min, & 49 sec.    10/28/2024:  Right: 26 sec., Left:  2 min, & 10 sec.  12/15/23  Right: 26 sec., Left:  4 min, & 8 sec.  SENSATION: Light touch: WFL, proprioceptive awareness: Intact   EDEMA: N/A   MUSCLE TONE: LUE: Hypotonic   COGNITION: Overall cognitive status: WFL for tasks assessed. Pt. Is impulsive at  times.   VISION: Subjective report: Pt. report having shingles affecting left eye  s/p 1 year. Has nerve pain Baseline vision: Wears glasses for reading only Visual history:  updated see clinical impression   VISION ASSESSMENT:    WFL for tasks performed   PERCEPTION: Intact   PRAXIS: Impaired: Motor planning   OBSERVATIONS:  Pt. more alert, and engaging since prior to the most recent hospitalization.    TODAY'S TREATMENT:    Therapeutic ex.:  Pt. Tolerated AAROM stretches for the left shoulder  in all ranges. Pt. Worked on BUE strengthening, and reciprocal motion using the UBE while seated for 10 min. with no resistance.  Pt. Worked on pinch strengthening in the left hand for lateral, and 3pt. pinch using yellow and red resistive clips. Pt. worked on placing the clips onto a horizontal dowel. Tactile and verbal cues were required for eliciting the desired movement.   Neuromuscular re-education:  Pt. worked on isolating 2nd digit extension to slide 1 square letter tiles off of an elevated platform to the thumb in preparation for stacking the into a vertical tower. Pt. Was able to stack 14 tiles vertically. Pt. Worked on grasping an storing one tile in the palm while grasping additional tiles.         PATIENT EDUCATION: Education details: LUE therapeutic exercise Person educated: Patient and Spouse Education method: Medical Illustrator Education comprehension: verbalized understanding, returned demonstration, and needs further education   HOME EXERCISE PROGRAM:   Reviewed activities at home to promote  isolated 2nd digit extension.    GOALS: Goals reviewed with patient? Yes   SHORT TERM GOALS: Target date: 12/10/2023   1. Patient will be independent with home exercise program for the left upper extremity Baseline: 12/15/2023: Independent. 10/29/2023: Independent 09/17/23: Independent 08/20/23: Pt. continues to require assist from he husband for HEPs 07/23/2023: Pt. continues to require assist. 05/28/2023: Pt. Requires assist 04/09/2023: Pt. Continues to consistently attempt to engage her hand at hand 02/01/2023: Pt. Consistently attempts to perform HEPs independently. No current home exercise program Goal status: Ongoing   LONG TERM GOALS: Target date:  01/21/2024  1.  Patient will improve left shoulder strength by 2 mm grades to be able to sustain UE's in elevation long enough to wash her hair.  Baseline: 12/15/2023:  Left shoulder flexion 3-/5, abduction: 3-/5 10/29/2023: Left shoulder flexion 3-/5, abduction: 3-/5 10/27/2023:  Pt. Is improving with reaching up to reach her hair for hair care. Pt. Continues to have difficulty sustaining the UE in elevation long enough to perform hair care. 09/17/2023: Left shoulder 2+/5, abduction 2+/5 08/20/2023: Left shoulder flexion 2+/5, abduction 2+/5  07/23/2023: Left shoulder flexion: 2/5, abduction: 2/5. 05/28/2023: Left shoulder flexion: 3-/5, abduction: 3-/5. Pt. has an old left shoulder injury limiting progression with left shoulder strength 05/26/2023: Pt. continues to present with limitiations in sustaining LUE elevation long enough to perform hair care thoroughly 04/09/2023: Pt. is limited with sustaining LUE elevation long enough to perform hair care thoroughly. 03/03/2023: Left shoulder flexion: 3-/5, abduction: 3-/5 01/22/2023: Left shoulder flexion: 3-/5, abduction: 3-/5 Eval: Left shoulder flexion: 3/5, abduction: 3-/5 Goal status: Ongoing   2.  Patient will improve left shoulder active abduction to be able to comb her hair Baseline: 12/15/2023: 75(103) Pt.  Has difficulty using her left hand to reach the right side, and back of her hair. 10/29/2023: Left shoulder abduction: 75(98) 10/27/2023: Pt. Is improving with reaching the left side of her hair for haircare. Pt. Has difficulty reaching with  the left side to the back, and right side of her head.09/17/2023: 73(94) Pt. is able to reach her left side, and back of hair. 9/11/09/2023: 72(85) Pt. is able to reach her left side, and back of hair. 07/23/2023: Left shoulder flexion: 60(70) abduction: 65(78) 05/28/2023: left shoulder flexion: 108 abduction: 85 Pt. Has an old left shoulder injury limiting progression with left shoulder ROM 05/26/2023:  5/10 left shoulder pain with ROM limits using it functionally during hair care. 04/09/2023: Pt. Presents with difficulty abducting her left shoulder enough to thoroughly complete haircare. 03/03/2023: Shoulder abduction: 85 01/22/2023: Shoulder abduction: 85 Eval: Left shoulder abduction is 80(108) Goal status: Ongoing   3.  Patient will independently button her shirt with modified independence. Baseline:12/15/2023:  Pt.is engaging her left hand more, continues to have difficulty with buttoning.10/29/2023: Pt. Has difficulty manipulating buttons, will benefit from a buttonhook.10/27/23: Pt. Requires increased time to complete buttons.09/17/2023: Pt. Has a buttonhook, however does not know where it is. Pt. requires increased time to complete, and assist from her husband. 08/20/2023: Pt. requires increased time to complete, and assist from her husband. 07/23/2023 Pt. presents with difficulty buttoning her shirt. 05/28/2023: pt. Continues to work towards progressing with buttoning 05/26/2023:  Pt. Continues to progress towards buttoning. 04/09/2023: Pt. Continues to progress towards buttoning. 03/03/2023: Pt. Continues to have difficulty with buttoning. 01/22/2023: Pt. continues to have difficulty. Eval: Patient has difficulty.  Goal status: Ongoing   4.  Patient well improve left grip  strength in preparation for securely hold a glass/beverage Baseline: 12/15/2023:  Grip strength: Right: 13#  Left: 10# 10/29/2023: Grip strength: Right: 13#  Left: 6#10/27/2023: Pt. Has difficulty securely holding heavier items with larger diameter. 09/17/2023: Grip strength: Right: 5#, Left: 4# Pt. Has difficulty holding a glass. Right: 8#, Left: 4# Pt. Is able to more securely hold mirrors, and flowers with the left hand, however has difficulty holding a glass beverage, or bottle. 08/20/2023: Right: 8#, Left: 4# Pt. Is able to more securely hold mirrors, and flowers with the left hand, however has difficulty holding a glass beverage, or bottle. 07/23/2023: Grip strength: Right: 5#, Left: 2# 05/28/2023: Right: 26#, Left: 13#  pt. Presents with difficulty securely holding flowers in her left hand. 05/26/2023: TBD 04/09/2023: Pt. Is able to hold, and hike pants with the left hand, continues to have difficulty with securely holding flowers.   03/03/2023: left grip strength: 13# 01/22/2023: Left: 12# Eval: Pt. Is unable to securely hold flowers. Goal status: Ongoing   5.  Pt. will independently recall adaptive strategies for performing ADL/ADL kitchen tasks.  Baseline: 12/15/2023: Independent 10/29/2023: Continue 11/19/204: Continue 08/20/2023: Revised to include IADL kitchen tasks.  07/23/2023: Continue 05/28/2023: Pt. Needs continued education about adaptive strategies.05/26/2023: Continue 04/09/2023: Continue3/26/2024: Continue 01/22/2023: Pt. continues to benefit from education about adaptive strategies during ADLs, and IADLs. Eval: Pt. to be provided with adaptive strategies. Goal status: Ongoing   6.  Pt. will improve FOTO score by 2 points to reflect patient perceived performance improvement assessment specific ADLs  and IADLs Baseline: 8/92/7974: 73 10/29/2023: 66 10/27/2023: TBD 09/17/2023: FOTO score: 61 07/23/2023: FOTO score: 57 05/28/2023: FOTO score 59 05/26/2023: TBD 5/02/204:  TBD 03/03/23: FOTO 62 Eval: 57 Goal  status: Ongoing  7.  Pt. will improve left hand coordination skills in order to be able to  manipulate small objects.    Baseline: 12/15/2023: 4 min. & 8 sec. 10/29/2023 Left: 2 min. & 10 sec. 10/27/2023: Pt. Is improving with manipulating small objects.09/17/2023: Right: 26  sec., Left:  3 min, & 49 sec. pegs placed in 5 min.; Pt. Was able to remove 9 vertical pegs in 24 sec. 08/20/2023: Right: 27 sec., Left:  5 pegs placed in 5 min.; Pt. Was able to remove 9 vertical pegs in 28 sec. Pt. Is able to hold, and sort utensils in a drawer. Pt. Has difficulty manipulating small objects. 07/23/2023: Right: 33 sec., Left:  Pt. Is unable to grasp, and place pegs into the pegboard. Pt. Was able to remove 9 vertical pegs in 34 sec. 05/28/2023: Left:  5 min. 05/26/2023 Pt. Continues to present with difficulty handling and sorting utensils. 04/14/2023: 56 04/09/2023: TBD 03/03/23: 1 min. & 39 01/22/2022: Left: 3 min. & 4 sec. Eval: Pt. has difficulty sorting, and placing utensils with the left hand. Left FMC : >5 min. For 7 pegs on the 9 hole peg test.    Goal Status: Ongoing  8. Pt. will improve active left 2nd digit extension to be able able to isolate her 2nd digit in preparation for pressing/pushing buttons on appliances, phones, or remotes. Baseline: 12/15/2023: Pt. Continues to improve with 2nd digit isolation, to however continues to present with limited prolonged 2nd digit extension 10/29/2023: Pt. Is improving with 2nd digit isolation, to however continues to present with limited prolonged 2nd digit extension11/19/2024: Pt. Is improving with 2nd digit isolation, to however presents with limited prolonged 2nd digit extension. 09/17/2023: Pt. Presents with limited isolated 2nd digit extension with increased time. 08/20/2023: Pt. Is more consistently able to isolate 2nd digit extension with increased time. 07/23/2023: Pt. Continues to work on consistency with isolating left 2nd digit extension 05/28/2023: Pt. Continues to  improve with left 2nd digit extension 05/26/2023: Improving with left 2nd digit extension. 04/09/2023: Pt. Is progressing with isolating left 2nd digit extension, however continues to present with limited increased flexor tone. 03/03/23: Pt. Continues to work on improving consistency with 2nd digit extension to press the remote. 01/22/2023: is able to perform full digit extension, although 2nd digit is slow to extend s 2/2 flexor tone. Pt.  Eval: Pt. is able to is unable to actively perform full digit extension Goal status: Ongoing  9: Pt. Will cut food with modified independence Baseline: 12/15/2023: Pt. continues to have difficulty cutting food on her plate 88/78/7975: : Pt. continues to have difficulty cutting food on her plate.10/27/2023: Pt. Continues to have difficulty cutting food on her plate. 09/17/2023: Pt. Is unable to cut the food on her plate, as well as cut food for meal preparation. 08/20/2023: Pt. is unable to cut the food on her plate, as well as cut food for meal preparation.    Goal Status: Ongoing   ASSESSMENT:   CLINICAL IMPRESSION:  Pt. was able to store one tile at a time in the palm of her hand while grasping a 2nd tile with her 2nd digit, and thumb. Pt. was able to stack 14 tiles vertically.  Pt. Presents with left shoulder pain since this morning, however tolerated the session with reports of increased pain. Pt. continues to work on improving LUE ROM, strength, FMC, and hand function skills in order to work towards improving, and maximizing independence with ADLs, and IADL tasks.   PERFORMANCE DEFICITS in functional skills including ADLs, IADLs, coordination, proprioception, ROM, strength, FMC, and GMC, cognitive skills including memory, and psychosocial skills including coping strategies, environmental adaptation, interpersonal interactions, and routines and behaviors.    IMPAIRMENTS are limiting patient from ADLs, IADLs, education, leisure, and social participation.  COMORBIDITIES may have co-morbidities  that affects occupational performance. Patient will benefit from skilled OT to address above impairments and improve overall function.   MODIFICATION OR ASSISTANCE TO COMPLETE EVALUATION: Min-Moderate modification of tasks or assist with assess necessary to complete an evaluation.   OT OCCUPATIONAL PROFILE AND HISTORY: Detailed assessment: Review of records and additional review of physical, cognitive, psychosocial history related to current functional performance.   CLINICAL DECISION MAKING: Moderate - several treatment options, min-mod task modification necessary   REHAB POTENTIAL: Good   EVALUATION COMPLEXITY: Moderate      PLAN: OT FREQUENCY: 2x/week   OT DURATION: 12 weeks   PLANNED INTERVENTIONS: self care/ADL training, therapeutic exercise, therapeutic activity, neuromuscular re-education, manual therapy, passive range of motion, functional mobility training, electrical stimulation, and paraffin   RECOMMENDED OTHER SERVICES: PT   CONSULTED AND AGREED WITH PLAN OF CARE: Patient and family member/caregiver   PLAN FOR NEXT SESSION: see above   Richardson Otter, MS, OTR/L  12/23/23, 8:32 AM

## 2023-12-24 ENCOUNTER — Ambulatory Visit: Payer: Medicare HMO | Admitting: Occupational Therapy

## 2023-12-24 ENCOUNTER — Ambulatory Visit: Payer: Medicare HMO | Admitting: Physical Therapy

## 2023-12-24 DIAGNOSIS — G8194 Hemiplegia, unspecified affecting left nondominant side: Secondary | ICD-10-CM | POA: Diagnosis not present

## 2023-12-24 DIAGNOSIS — Z01818 Encounter for other preprocedural examination: Secondary | ICD-10-CM | POA: Diagnosis not present

## 2023-12-24 DIAGNOSIS — I6389 Other cerebral infarction: Secondary | ICD-10-CM | POA: Diagnosis not present

## 2023-12-29 ENCOUNTER — Ambulatory Visit: Payer: Medicare HMO | Admitting: Occupational Therapy

## 2023-12-29 ENCOUNTER — Ambulatory Visit: Payer: Medicare HMO | Admitting: Physical Therapy

## 2023-12-29 DIAGNOSIS — R2681 Unsteadiness on feet: Secondary | ICD-10-CM | POA: Diagnosis not present

## 2023-12-29 DIAGNOSIS — M6281 Muscle weakness (generalized): Secondary | ICD-10-CM

## 2023-12-29 DIAGNOSIS — R278 Other lack of coordination: Secondary | ICD-10-CM

## 2023-12-29 DIAGNOSIS — R262 Difficulty in walking, not elsewhere classified: Secondary | ICD-10-CM | POA: Diagnosis not present

## 2023-12-29 DIAGNOSIS — R42 Dizziness and giddiness: Secondary | ICD-10-CM | POA: Diagnosis not present

## 2023-12-29 DIAGNOSIS — I6381 Other cerebral infarction due to occlusion or stenosis of small artery: Secondary | ICD-10-CM | POA: Diagnosis not present

## 2023-12-29 DIAGNOSIS — R531 Weakness: Secondary | ICD-10-CM

## 2023-12-29 NOTE — Therapy (Unsigned)
OUTPATIENT PHYSICAL THERAPY TREATMENT/ Re-certification.     Patient Name: [name] MRN: [id number] DOB:1941-09-21, 83 y.o., female Today's Date: 12/29/2023   PCP: [name], D MD REFERRING PROVIDER: Jacquelynn Cree PA   END OF SESSION:  PT End of Session - 12/29/23 1622     Visit Number 27    Number of Visits 31    Date for PT Re-Evaluation 01/05/24    Authorization Type [employer]:    Authorization Time Period approved for 24 additional tx from 09/28/23 to 12/24/23; total visit count 31    Progress Note Due on Visit 30    PT Start Time 1619    PT Stop Time 1700    PT Time Calculation (min) 41 min    Equipment Utilized During Treatment Gait belt    Activity Tolerance Patient tolerated treatment well    Behavior During Therapy WFL for tasks assessed/performed                  Past Medical History:  Diagnosis Date   Anemia    Arthritis    Asthma    uses inhaler just prior to surgery to avoid attack   Back pain    from previous injury   Complication of anesthesia    has woken  up during 2 different surgery   Depression    no current issue/treatment; situation   Gallstones    GERD (gastroesophageal reflux disease)    Hiatal hernia    patient does NOT have nerve/muscle disease   History of kidney stones    HLD (hyperlipidemia)    HTN (hypertension)    Hypothyroidism    Kidney stones    Knee pain    Nausea and vomiting 10/15/2022   Non-diabetic pancreatic hormone dysfunction years   pt. states pancreas does not function properly   Pancreatitis    Pneumonia    Seizures (HCC)    caused by dye injected during a procedure   Shortness of breath    with exertion   Sinus problem    frequent infections/congestion   Stroke (HCC) 2021   reports having CVA in 2021 and having mini strokes before that   Thyroid disease    Past Surgical History:  Procedure Laterality Date   ABDOMINAL HYSTERECTOMY     APPENDECTOMY     CARPAL TUNNEL RELEASE  10+  years ago   bilateral   EYE SURGERY  3 yrs ago   bilateral cataracts   FOOT OSTEOTOMY  6 weeks ago   Left foot: great, 2nd & 3rd   FOOT OSTEOTOMY  5 years ago   Right great toe   HAND SURGERY Bilateral 2011-most recent   multiple hand surgeries, 2 on left, 3 on right   KNEE ARTHROPLASTY Right 04/28/2022   Procedure: COMPUTER ASSISTED TOTAL KNEE ARTHROPLASTY;  Surgeon: Donato Heinz, MD;  Location: [facility];  Service: Orthopedics;  Laterality: Right;   LOOP RECORDER INSERTION N/A 05/16/2020   Procedure: LOOP RECORDER INSERTION;  Surgeon: Marcina Millard, MD;  Location: [facility];  Service: Cardiovascular;  Laterality: N/A;   NASAL SINUS SURGERY  most recent 7-8 yrs ago   7 sinus surgeries    TRIGGER FINGER RELEASE  11/19/2011   Procedure: RELEASE TRIGGER FINGER/A-1 PULLEY;  Surgeon: Nicki Reaper, MD;  Location: [facility];  Service: Orthopedics;  Laterality: Right;  release a-1 pulley right index finger and cyst removal   WRIST GANGLION EXCISION  1980's  right     ONSET DATE: 11/23/22  REFERRING DIAG: TIA  THERAPY DIAG:  Muscle weakness (generalized)  Dizziness and giddiness  Other lack of coordination  Unsteadiness on feet  Difficulty in walking, not elsewhere classified  Acute left-sided weakness  Rationale for Evaluation and Treatment: Rehabilitation  SUBJECTIVE:                                                                                                                                                                                             SUBJECTIVE STATEMENT:   Pt reports that she is doing well. Very tired this afternoon. TKA scheduled for 2/5. Will plan on d/c from PT in preparation of surgery after next treatment.    Pt accompanied by: significant other  PERTINENT HISTORY: [name] is an 66 year-old female who was admitted to [facility] on 11/23/2022 with a cerebellar CVA. 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 Pt. Was admitted to Inpatient Rehab from 12/21-1/02/2023. 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  Patient underwent inpatient rehabilitation for 2 weeks.  Patient was assessed and was scheduled for a knee replacement on 08/29/2022 however had to cancel it due to having had a CVA. PMH includes: anemia, arthritis, back pain, depression, hernia, GERD, HLD, HTN, hypothyroidism, knee pain, pancreatic hormone dysfunction, pneumonia, seizures, stroke, thyroid disease.  Patient has a donjoy brace for L knee.   PAIN:  Are you having pain? no  PRECAUTIONS: Fall, has latex allergy  WEIGHT BEARING RESTRICTIONS: No  FALLS: Has patient fallen in last 6 months? Yes. Number of falls 4 falls   LIVING ENVIRONMENT: Lives with: lives with their family and lives with their spouse Lives in: House/apartment Stairs: Yes: Internal: flight steps;   and External: 2 steps; on right going up and on left going up Has following equipment at home: Single point cane, Walker - 2 wheeled, Shower bench, and bed side commode  PLOF: Independent  PATIENT GOALS: to have less pain and move better  OBJECTIVE:   TODAY'S TREATMENT: DATE: 12/29/23  Stand pivot transfer to mat table with CGA and no AD. UE support on WC an dmat.   PT treatment focused on BLE strengthening  SAQ with 4# AW  Bridge x 10  Sidelying clam shell AROM x 12 bil  Sidelying hip abduction AAROM x 12 bil  Heel slide x 12 bil 4#  Sit<>supine with supervision assist.  Gait with RW x 167ft with CGA. Min cues for AD management with fatigue. Standing hip flexion x 10 bil. Reports dizziness following standing march.  Returned to sidelying  in bed for dizziness management per pt request. Therapeutic rest break. Min assist then to sit EOB and prevent posterior LOB. Stand pivot transfer to [facility] with RW and CGA.     PATIENT EDUCATION: Education details: Pt educated throughout  session about proper posture and technique with exercises. Improved exercise technique, movement at target joints, use of target muscles after min to mod verbal, visual, tactile cues.   HOME EXERCISE PROGRAM:  Access Code: [email or web address] URL: [email or web address] Date: 10/07/2023 Prepared by: Grier Rocher  Exercises - Standing March with Counter Support  - 1 x daily - 3 x weekly - 2 sets - 10 reps - Standing Hip Abduction with Unilateral Counter Support  - 1 x daily - 3 x weekly - 2 sets - 10 reps - 2 hold - Standing Hip Extension with Unilateral Counter Support  - 1 x daily - 3 x weekly - 2 sets - 10 reps - 2 hold - Supine Straight Leg Raises  - 1 x daily - 37 x weekly - 2 sets - 10 reps - Bridge  - 1 x daily - 3 x weekly - 2 sets - 10 reps - 2 hold - Supine Heel Slide  - 1 x daily - 3 x weekly - 2 sets - 10 reps  GOALS: Goals reviewed with patient? Yes  SHORT TERM GOALS: Target date: 09/19/23  Patient will be independent in starter HEP to improve A/ROM tolerance and preserve available ROM of LLE and to advance A/ROM and strength of RLE.  Baseline: 08/20/23: defer to vists 2-3  Goal status: NEW  LONG TERM GOALS: Target date: 10/20/23  Patient will increase FOTO score to equal to or greater than 65%  to demonstrate statistically significant improvement in ease of mobility.  Baseline:  47%; 06/23/23: 60;  7/16: 60%  10/31: 54 11/19/23: 56 Goal status: PROGRESSING   2.  Patient (> 53 years old) will complete five times sit to stand test in < 15 seconds indicating an increased RLE power.  Baseline: 5/7: unable to tolerate 7/16: 17.4 sec 10/31: 11.02 sec 11/19/23: 13.06 sec Goal status: PROGRESSING   3.   Patient to report tolerance of ad lib AMB in home during the day of distances >1ft >5x at supervision level or better to improve independence with ADL.    Baseline: 08/20/23: using transport chair for nearly all mobility, not tolerating walking due to WB limitations of  LLE  10/31: able to ambulate 30-70ft with CGA from PT.  11/19/23: Pt able to ambulate 255.3' with CGA, FWW, and chair follow Goal status: PROGRESSING      ASSESSMENT:  CLINICAL IMPRESSION:   Pt limited by fatigue and dizziness on this day. TKA scheduled for 2/5. Will d/c from PT following next PT treatment in preparation of surgery. Continued BLE strengthening and increased gait training, despite limitations of dizziness at end of treatment session. Pt will continue to benefit from skilled therapy to address remaining deficits in order to improve overall QoL and return to PLOF.     OBJECTIVE IMPAIRMENTS: Abnormal gait, cardiopulmonary status limiting activity, decreased activity tolerance, decreased balance, decreased cognition, decreased endurance, decreased knowledge of use of DME, decreased mobility, difficulty walking, decreased ROM, decreased strength, decreased safety awareness, hypomobility, increased fascial restrictions, impaired perceived functional ability, impaired flexibility, impaired UE functional use, improper body mechanics, postural dysfunction, and pain.   ACTIVITY LIMITATIONS: carrying, lifting, bending, sitting, standing, squatting, stairs, transfers, bed mobility, bathing, toileting, dressing, reach over head, hygiene/grooming, locomotion level, and caring  for others  PARTICIPATION LIMITATIONS: meal prep, cleaning, laundry, medication management, personal finances, interpersonal relationship, driving, shopping, community activity, yard work, school, and church  PERSONAL FACTORS: Age, Behavior pattern, Education, Fitness, Past/current experiences, Time since onset of injury/illness/exacerbation, and 3+ comorbidities: anemia, arthritis, back pain, depression, hernia, GERD, HLD, HTN, hypothyroidism, knee pain, pancreatic hormone dysfunction, pneumonia, seizures, stroke, thyroid disease  are also affecting patient's functional outcome.   REHAB POTENTIAL: Fair    CLINICAL  DECISION MAKING: Evolving/moderate complexity  EVALUATION COMPLEXITY: Moderate  PLAN:  PT FREQUENCY: 1-2x/week  PT DURATION: 10 weeks  PLANNED INTERVENTIONS: Therapeutic exercises, Therapeutic activity, Neuromuscular re-education, Balance training, Gait training, Patient/Family education, Self Care, Joint mobilization, Joint manipulation, Stair training, Vestibular training, Canalith repositioning, Visual/preceptual remediation/compensation, Orthotic/Fit training, DME instructions, Electrical stimulation, Wheelchair mobility training, Spinal mobilization, Cryotherapy, Moist heat, Compression bandaging, scar mobilization, Splintting, Taping, Traction, Ultrasound, Manual therapy, and Re-evaluation   PLAN FOR NEXT SESSION:  D/c assessment.   Grier Rocher PT, DPT  Physical Therapist - [facility]  4:42 PM 12/29/23

## 2023-12-29 NOTE — Therapy (Signed)
Occupational Therapy Treatment Note      Patient Name: [name] MRN: [id number] DOB:1941-11-22, 83 y.o., female Today's Date: 12/29/2023  PCP: Dr. Judithann Sheen REFERRING PROVIDER:  Dr. Judithann Sheen  END OF SESSION:  PLAN:   OT End of Session - 12/29/23 2259     Visit Number 83    Number of Visits 120    Date for OT Re-Evaluation 03/08/24    OT Start Time 1530    OT Stop Time 1615    OT Time Calculation (min) 45 min    Activity Tolerance Patient tolerated treatment well    Behavior During Therapy WFL for tasks assessed/performed                   Past Medical History:  Diagnosis Date   Anemia    Arthritis    Asthma    uses inhaler just prior to surgery to avoid attack   Back pain    from previous injury   Complication of anesthesia    has woken  up during 2 different surgery   Depression    no current issue/treatment; situation   Gallstones    GERD (gastroesophageal reflux disease)    Hiatal hernia    patient does NOT have nerve/muscle disease   History of kidney stones    HLD (hyperlipidemia)    HTN (hypertension)    Hypothyroidism    Kidney stones    Knee pain    Nausea and vomiting 10/15/2022   Non-diabetic pancreatic hormone dysfunction years   pt. states pancreas does not function properly   Pancreatitis    Pneumonia    Seizures (HCC)    caused by dye injected during a procedure   Shortness of breath    with exertion   Sinus problem    frequent infections/congestion   Stroke (HCC) 2021   reports having CVA in 2021 and having mini strokes before that   Thyroid disease    Past Surgical History:  Procedure Laterality Date   ABDOMINAL HYSTERECTOMY     APPENDECTOMY     CARPAL TUNNEL RELEASE  10+ years ago   bilateral   EYE SURGERY  3 yrs ago   bilateral cataracts   FOOT OSTEOTOMY  6 weeks ago   Left foot: great, 2nd & 3rd   FOOT OSTEOTOMY  5 years ago   Right great toe   HAND SURGERY Bilateral 2011-most recent   multiple hand  surgeries, 2 on left, 3 on right   KNEE ARTHROPLASTY Right 04/28/2022   Procedure: COMPUTER ASSISTED TOTAL KNEE ARTHROPLASTY;  Surgeon: Donato Heinz, MD;  Location: [facility];  Service: Orthopedics;  Laterality: Right;   LOOP RECORDER INSERTION N/A 05/16/2020   Procedure: LOOP RECORDER INSERTION;  Surgeon: Marcina Millard, MD;  Location: [facility];  Service: Cardiovascular;  Laterality: N/A;   NASAL SINUS SURGERY  most recent 7-8 yrs ago   7 sinus surgeries    TRIGGER FINGER RELEASE  11/19/2011   Procedure: RELEASE TRIGGER FINGER/A-1 PULLEY;  Surgeon: Nicki Reaper, MD;  Location: [facility];  Service: Orthopedics;  Laterality: Right;  release a-1 pulley right index finger and cyst removal   WRIST GANGLION EXCISION  1980's   right   Patient Active Problem List   Diagnosis Date Noted   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 11/27/2022   Hypomagnesemia 11/27/2022   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   HLD (hyperlipidemia) 11/23/2022  Asthma 11/23/2022   Depression with anxiety 11/23/2022   Chronic diastolic CHF (congestive heart failure) (HCC) 11/23/2022   Normocytic anemia 11/23/2022   Aspiration pneumonia (HCC) 11/23/2022   AKI (acute kidney injury) (HCC) 11/23/2022   Abdominal pain 11/23/2022   Mesenteric mass 11/23/2022   Coffee ground emesis 11/23/2022   Nausea and vomiting 10/15/2022   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   Fatigue 09/17/2022   Left hemiparesis (HCC) 09/17/2022   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HCC) 08/22/2022   GERD (gastroesophageal reflux disease) 08/21/2022   Agitation 08/20/2022   Acute left-sided weakness 08/20/2022   Expressive aphasia    Stroke (HCC) 08/19/2022   Leukocytosis 08/19/2022   History of urticaria 04/28/2022   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   Primary osteoarthritis of left knee 02/24/2022   Primary osteoarthritis of right knee 02/24/2022   Lumbar spondylolysis 04/12/2020    History of CVA (cerebrovascular accident) 03/26/2020   Low back pain radiating to right lower extremity 03/21/2020   B12 deficiency 03/06/2020   Positive anti-CCP test 12/21/2019   Arthralgia 12/13/2019   Dermatitis 12/13/2019   Rheumatoid factor positive 12/13/2019   Essential hypertension 12/11/2018   Palpitations 12/11/2018   Acquired hypothyroidism 11/10/2018   Arthritis of knee 09/17/2016   Anxiety 11/22/2014   Asthma without status asthmaticus 11/22/2014   Benign neoplasm of colon, unspecified 11/22/2014   Environmental allergies 11/22/2014   Hypertriglyceridemia 11/22/2014   Hypokalemia 11/22/2014   Personal history of disease of skin and subcutaneous tissue 11/22/2014   REFERRING DIAG: CVA   THERAPY DIAG:  Muscle weakness (generalized)   Other lack of coordination   Rationale for Evaluation and Treatment Rehabilitation   SUBJECTIVE:   SUBJECTIVE STATEMENT:  Pt. reports that she has been cleared by the neurologist, and has been scheduled to have a TKR on 01/13/2024.  Pt accompanied by: significant other   PERTINENT HISTORY: Patient is an 83 year-old female who was admitted to [facility] on 11/23/2022 with a cerebellar CVA. 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 Pt. Was admitted to Inpatient Rehab from 12/21-1/02/2023. 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  Patient underwent inpatient rehabilitation for 2 weeks.  Patient was assessed and was scheduled for a knee replacement on 08/29/2022 however had to cancel it due to having had a CVA.  Patient had a recent fall 2 days after discharging from inpatient rehab. Past Medical History includes: Knee replacement, essential HTN, hypokalemia, leukocytosis, seizures, positive anti-- CCP test, anxiety disorder, mini strokes.  Patient had shingles with left eye nerve pain s/p 1 year ago.    PRECAUTIONS: Fall   WEIGHT BEARING RESTRICTIONS No    PAIN:  Are you having pain?  No   FALLS: Has patient fallen in last 6 months? Yes. Number of falls 1   LIVING ENVIRONMENT: Lives with: Lives with Spouse Lives in: House/apartment Stairs: 2 storey home, resides on the first floor.  External: 2 stairs front no rails, and 6 in back with rails Has following equipment at home: Single point cane, Walker - 2 wheeled, Environmental consultant - 4 wheeled, Shower bench, and bed side commode   PLOF: Independent   PATIENT GOALS  To Regain the use of her left arm   OBJECTIVE:    HAND DOMINANCE: Right   ADLs: Overall ADLs: Husband assists pt. as needed Transfers/ambulation related to ADLs:Pt. Uses a 3 wheeled walker with Husband assist. Eating: Pt. Is independent with the right hand. Pt. has difficulty cutting food. Grooming:  Pt. is using her right hand, however has difficulty sustaining her LUE in elevation to assist with haircare. UB Dressing: Pt. Is independent donning a pullover shirt, and button down shirt. Has difficulty with buttoning, LB Dressing:  Independent donning pants, and socks. Difficulty tying shoes. Toileting: Independent Bathing: Pt. Is able to engage her right hand. Tub Shower transfers: Supervision Equipment: See above for equipment    IADLs: Shopping:  Has not had the opportunity for grocery shopping yet Light housekeeping: Husband is assisting with light house keeping Meal Prep:  Dependent Community mobility: Relies of family/friends Medication management: Husband assisting with weekly pillbox set-up, and administering medication. Financial management: TBD Handwriting: 75% legible   MOBILITY STATUS: Hx of falls   POSTURE COMMENTS:  No Significant postural limitations Sitting balance: supported sitting balance WFL   ACTIVITY TOLERANCE: Activity tolerance:  Fatigues in greater than 30 min.    FUNCTIONAL OUTCOME MEASURES: FOTO: 57   UPPER EXTREMITY ROM      Active ROM Right Eval: WFL Left eval Left  01/22/23 Left   03/03/23 Left  04/14/23 Left  05/28/2023 Left 07/23/2023 Left 08/20/2023 Left 09/17/23 Left 10/29/23 Left 10/29/23  Shoulder flexion   132 100 108 108 108 60(70) 70(78) 70(102) 82(110) 87(110)  Shoulder abduction   80 85 85 85 85 65(78) 72(85) 73(94) 75(98) 75(103)  Shoulder adduction               Shoulder extension               Shoulder internal rotation               Shoulder external rotation               Elbow flexion   140 140 [facility] WFL  Elbow extension   WNL WNL [facility] WFL WFL WFL FL  Wrist flexion   65 68          Wrist extension   -10 20 24  32 32 -10 20 20 26  32  Wrist ulnar deviation     12 10 14 14 10 18 18 20 18   Wrist radial deviation     8 14 14 16 12 14 14 14 20   Wrist pronation               Wrist supination               (Blank rows = not tested)   Left digit flexion to [facility]: 2nd: 0cm, 3rd: 0cm, 4th: 0cm, 5th: 0cm   Limited Left full 2nd digit extension     UPPER EXTREMITY MMT:      MMT Right Eval: 4+/5 overall Right 07/23/2023 Left Eval Left 01/22/23 Left 03/03/23 Left  04/14/2023 Left 05/28/2023 Left 07/23/2023 Left 08/20/2023 Left 09/17/23 Left  10/29/23 Left 10/14/24  Shoulder flexion   3+/5 3/5 3-/5 3-/5 3-/5 3-/5 2/5 2+/5 2+/5 3-/5 3-/5  Shoulder abduction   3+/5 3-/5 3-/5 3-/5 3-/5 3-/5 2/5 2+/5 2+/5 3-/5 3-/5  Shoulder adduction                Shoulder extension                Shoulder internal rotation                Shoulder external rotation                Middle trapezius  Lower trapezius                Elbow flexion   4/5 3+/5 4/5 N/A 4/5 4/5 3+/5 3+/5 3+/5 3+/5 4-/5  Elbow extension   4/5 3+/5 4/4 N/A 4/5 4/5 3+/5 3+/5 4-/5 4-/5 4/5  Wrist flexion                Wrist extension   4/5 2-/5 3-/5 3-/5 3/5 3+/5 3-/5 3+/5 3+/5 3+/5 3+/5  Wrist ulnar deviation                Wrist radial deviation                Wrist pronation                Wrist supination                (Blank rows = not  tested)   HAND FUNCTION: Grip strength: Right: 26#, Left: 10# Pinch strength: Right 8#, Left: 3#, 3 Pt. Pinch strength: Right: 9#, L: 2#  01/22/2023 Grip strength: Right: 26#, Left: 12# Pinch strength:  Pinch meter used at the initial eval has been sent out for recalibration   03/03/2023 Grip strength: Right: 26#, Left: 13# Pinch strength:  Pinch meter used at the initial eval has been sent out for recalibration  04/14/2023: Grip strength: Right: 26#, Left: 13# Pinch strength:   Right 8#, Left: 4#, 3 Pt. Pinch strength: Right: 9#, L: 3#    05/28/2023: Grip strength: Right: 26#, Left: 13# Pinch strength:   Right 8#, Left: 2#, 3 Pt. Pinch strength: Right: 9#, L: 4#  07/23/2023  Grip strength: Right: 5#, Left: 2# Pinch strength:   Right 6#, Left: 2#, 3 Pt. Pinch strength: Right: 5#, L: 2#  08/20/2023:    Grip strength: Right: 8#, Left: 4# Pinch strength:   Right 6#, Left: 2#, 3 Pt. Pinch strength: Right: 5#, L: 2#    09/17/23:  Grip strength: Right: 5#, Left: 4# Pinch strength:   Right 6#, Left: 2#, 3 Pt. Pinch strength: Right: 5#, L: 2#  10/28/2024  Grip strength: Right: 13#, Left: 6# Pinch strength:   Right 6#, Left: 2#, 3 Pt. Pinch strength: Right: 5#, L: 2#  12/15/2023:  Grip strength: Right: 13#, Left:10# Pinch strength:  Right 6#, Left: 0#, 3 Pt. Pinch strength: Right: 5#, L: 4#     COORDINATION: Right: 22 sec., Left: <5 min. To place 7 pegs with increased compensation proximally in the trunk, and through reflexive associated reactions.  01/22/23 Right: 22 sec., Left: 3 min. & 4 sec.  03/03/23 Right: 22 sec., Left: 1 min. & 39 sec.  04/14/23   TBD  05/28/23 Right: 22 sec., Left:  5 min.  07/23/2023  Right: 33 sec., Left:  Pt. Is unable to grasp, and place pegs into the pegboard. Pt. Was able to remove 9 vertical pegs in 34 sec.  08/20/2023:  Right: 27 sec., Left:  5 pegs placed in 5 min.; Pt. Was able to remove 9 vertical pegs in 28  sec.  09/17/23:  Right: 26 sec., Left:  3 min, & 49 sec.    10/28/2024:  Right: 26 sec., Left:  2 min, & 10 sec.  12/15/23  Right: 26 sec., Left:  4 min, & 8 sec.  SENSATION: Light touch: WFL, proprioceptive awareness: Intact   EDEMA: N/A   MUSCLE TONE: LUE: Hypotonic   COGNITION: Overall cognitive status: WFL for tasks assessed. Pt. Is impulsive at  times.   VISION: Subjective report: Pt. report having shingles affecting left eye  s/p 1 year. Has nerve pain Baseline vision: Wears glasses for reading only Visual history:  updated see clinical impression   VISION ASSESSMENT:    WFL for tasks performed   PERCEPTION: Intact   PRAXIS: Impaired: Motor planning   OBSERVATIONS:  Pt. more alert, and engaging since prior to the most recent hospitalization.    TODAY'S TREATMENT:    Therapeutic ex.:  Pt. tolerated AAROM stretches for the left shoulder in all ranges. Pt. worked on [employer], and reciprocal motion using the UBE while seated for 10 min. with no resistance.  Pt. worked on pinch strengthening in the left hand for lateral, and 3pt. pinch using yellow and red resistive clips. Pt. worked on placing the clips onto a horizontal dowel. Tactile and verbal cues were required for eliciting the desired movement.   Neuromuscular re-education:  Pt. worked on left [facility] skills grasping 1/2", 3/4", and 1" flat washers from a shallow magnetic dish, and reaching up to place them on the hooks positioned at a vertical angle.           PATIENT EDUCATION: Education details: LUE therapeutic exercise Person educated: Patient and Spouse Education method: Medical illustrator Education comprehension: verbalized understanding, returned demonstration, and needs further education   HOME EXERCISE PROGRAM:   Reviewed activities at home to promote isolated 2nd digit extension.    GOALS: Goals reviewed with patient? Yes   SHORT TERM GOALS: Target date: 12/10/2023   1.  Patient will be independent with home exercise program for the left upper extremity Baseline: 12/15/2023: Independent. 10/29/2023: Independent 09/17/23: Independent 08/20/23: Pt. continues to require assist from he husband for HEPs 07/23/2023: Pt. continues to require assist. 05/28/2023: Pt. Requires assist 04/09/2023: Pt. Continues to consistently attempt to engage her hand at hand 02/01/2023: Pt. Consistently attempts to perform HEPs independently. No current home exercise program Goal status: Ongoing   LONG TERM GOALS: Target date:  01/21/2024  1.  Patient will improve left shoulder strength by 2 mm grades to be able to sustain UE's in elevation long enough to wash her hair.  Baseline: 12/15/2023:  Left shoulder flexion 3-/5, abduction: 3-/5 10/29/2023: Left shoulder flexion 3-/5, abduction: 3-/5 10/27/2023:  Pt. Is improving with reaching up to reach her hair for hair care. Pt. Continues to have difficulty sustaining the UE in elevation long enough to perform hair care. 09/17/2023: Left shoulder 2+/5, abduction 2+/5 08/20/2023: Left shoulder flexion 2+/5, abduction 2+/5  07/23/2023: Left shoulder flexion: 2/5, abduction: 2/5. 05/28/2023: Left shoulder flexion: 3-/5, abduction: 3-/5. Pt. has an old left shoulder injury limiting progression with left shoulder strength 05/26/2023: Pt. continues to present with limitiations in sustaining LUE elevation long enough to perform hair care thoroughly 04/09/2023: Pt. is limited with sustaining LUE elevation long enough to perform hair care thoroughly. 03/03/2023: Left shoulder flexion: 3-/5, abduction: 3-/5 01/22/2023: Left shoulder flexion: 3-/5, abduction: 3-/5 Eval: Left shoulder flexion: 3/5, abduction: 3-/5 Goal status: Ongoing   2.  Patient will improve left shoulder active abduction to be able to comb her hair Baseline: 12/15/2023: 75(103) Pt. Has difficulty using her left hand to reach the right side, and back of her hair. 10/29/2023: Left shoulder abduction: 75(98)  10/27/2023: Pt. Is improving with reaching the left side of her hair for haircare. Pt. Has difficulty reaching with the left side to the back, and right side of her head.09/17/2023: 73(94) Pt. is able to reach her left  side, and back of hair. 9/11/09/2023: 72(85) Pt. is able to reach her left side, and back of hair. 07/23/2023: Left shoulder flexion: 60(70) abduction: 65(78) 05/28/2023: left shoulder flexion: 108 abduction: 85 Pt. Has an old left shoulder injury limiting progression with left shoulder ROM 05/26/2023:  5/10 left shoulder pain with ROM limits using it functionally during hair care. 04/09/2023: Pt. Presents with difficulty abducting her left shoulder enough to thoroughly complete haircare. 03/03/2023: Shoulder abduction: 85 01/22/2023: Shoulder abduction: 85 Eval: Left shoulder abduction is 80(108) Goal status: Ongoing   3.  Patient will independently button her shirt with modified independence. Baseline:12/15/2023:  Pt.is engaging her left hand more, continues to have difficulty with buttoning.10/29/2023: Pt. Has difficulty manipulating buttons, will benefit from a buttonhook.10/27/23: Pt. Requires increased time to complete buttons.09/17/2023: Pt. Has a buttonhook, however does not know where it is. Pt. requires increased time to complete, and assist from her husband. 08/20/2023: Pt. requires increased time to complete, and assist from her husband. 07/23/2023 Pt. presents with difficulty buttoning her shirt. 05/28/2023: pt. Continues to work towards progressing with buttoning 05/26/2023:  Pt. Continues to progress towards buttoning. 04/09/2023: Pt. Continues to progress towards buttoning. 03/03/2023: Pt. Continues to have difficulty with buttoning. 01/22/2023: Pt. continues to have difficulty. Eval: Patient has difficulty.  Goal status: Ongoing   4.  Patient well improve left grip strength in preparation for securely hold a glass/beverage Baseline: 12/15/2023:  Grip strength: Right: 13#  Left: 10#  10/29/2023: Grip strength: Right: 13#  Left: 6#10/27/2023: Pt. Has difficulty securely holding heavier items with larger diameter. 09/17/2023: Grip strength: Right: 5#, Left: 4# Pt. Has difficulty holding a glass. Right: 8#, Left: 4# Pt. Is able to more securely hold mirrors, and flowers with the left hand, however has difficulty holding a glass beverage, or bottle. 08/20/2023: Right: 8#, Left: 4# Pt. Is able to more securely hold mirrors, and flowers with the left hand, however has difficulty holding a glass beverage, or bottle. 07/23/2023: Grip strength: Right: 5#, Left: 2# 05/28/2023: Right: 26#, Left: 13#  pt. Presents with difficulty securely holding flowers in her left hand. 05/26/2023: TBD 04/09/2023: Pt. Is able to hold, and hike pants with the left hand, continues to have difficulty with securely holding flowers.   03/03/2023: left grip strength: 13# 01/22/2023: Left: 12# Eval: Pt. Is unable to securely hold flowers. Goal status: Ongoing   5.  Pt. will independently recall adaptive strategies for performing ADL/ADL kitchen tasks.  Baseline: 12/15/2023: Independent 10/29/2023: Continue 11/19/204: Continue 08/20/2023: Revised to include IADL kitchen tasks.  07/23/2023: Continue 05/28/2023: Pt. Needs continued education about adaptive strategies.05/26/2023: Continue 04/09/2023: Continue3/26/2024: Continue 01/22/2023: Pt. continues to benefit from education about adaptive strategies during ADLs, and IADLs. Eval: Pt. to be provided with adaptive strategies. Goal status: Ongoing   6.  Pt. will improve FOTO score by 2 points to reflect patient perceived performance improvement assessment specific ADLs  and IADLs Baseline: 1/61/0960: 73 10/29/2023: 66 10/27/2023: TBD 09/17/2023: FOTO score: 61 07/23/2023: FOTO score: 57 05/28/2023: FOTO score 59 05/26/2023: TBD 5/02/204:  TBD 03/03/23: FOTO 62 Eval: 57 Goal status: Ongoing  7.  Pt. will improve left hand coordination skills in order to be able to  manipulate small  objects.    Baseline: 12/15/2023: 4 min. & 8 sec. 10/29/2023 Left: 2 min. & 10 sec. 10/27/2023: Pt. Is improving with manipulating small objects.09/17/2023: Right: 26 sec., Left:  3 min, & 49 sec. pegs placed in 5 min.; Pt. Was able to remove 9 vertical  pegs in 24 sec. 08/20/2023: Right: 27 sec., Left:  5 pegs placed in 5 min.; Pt. Was able to remove 9 vertical pegs in 28 sec. Pt. Is able to hold, and sort utensils in a drawer. Pt. Has difficulty manipulating small objects. 07/23/2023: Right: 33 sec., Left:  Pt. Is unable to grasp, and place pegs into the pegboard. Pt. Was able to remove 9 vertical pegs in 34 sec. 05/28/2023: Left:  5 min. 05/26/2023 Pt. Continues to present with difficulty handling and sorting utensils. 04/14/2023: 56 04/09/2023: TBD 03/03/23: 1 min. & 39 01/22/2022: Left: 3 min. & 4 sec. Eval: Pt. has difficulty sorting, and placing utensils with the left hand. Left FMC : >5 min. For 7 pegs on the 9 hole peg test.    Goal Status: Ongoing  8. Pt. will improve active left 2nd digit extension to be able able to isolate her 2nd digit in preparation for pressing/pushing buttons on appliances, phones, or remotes. Baseline: 12/15/2023: Pt. Continues to improve with 2nd digit isolation, to however continues to present with limited prolonged 2nd digit extension 10/29/2023: Pt. Is improving with 2nd digit isolation, to however continues to present with limited prolonged 2nd digit extension11/19/2024: Pt. Is improving with 2nd digit isolation, to however presents with limited prolonged 2nd digit extension. 09/17/2023: Pt. Presents with limited isolated 2nd digit extension with increased time. 08/20/2023: Pt. Is more consistently able to isolate 2nd digit extension with increased time. 07/23/2023: Pt. Continues to work on consistency with isolating left 2nd digit extension 05/28/2023: Pt. Continues to improve with left 2nd digit extension 05/26/2023: Improving with left 2nd digit extension. 04/09/2023: Pt. Is  progressing with isolating left 2nd digit extension, however continues to present with limited increased flexor tone. 03/03/23: Pt. Continues to work on improving consistency with 2nd digit extension to press the remote. 01/22/2023: is able to perform full digit extension, although 2nd digit is slow to extend s 2/2 flexor tone. Pt.  Eval: Pt. is able to is unable to actively perform full digit extension Goal status: Ongoing  9: Pt. Will cut food with modified independence Baseline: 12/15/2023: Pt. continues to have difficulty cutting food on her plate 62/13/0865: : Pt. continues to have difficulty cutting food on her plate.10/27/2023: Pt. Continues to have difficulty cutting food on her plate. 09/17/2023: Pt. Is unable to cut the food on her plate, as well as cut food for meal preparation. 08/20/2023: Pt. is unable to cut the food on her plate, as well as cut food for meal preparation.    Goal Status: Ongoing   ASSESSMENT:   CLINICAL IMPRESSION:  Pt. tolerated the UE stretches, and bilateral reciprocal motion on the UBE well. Pt. reports no pain. Pt. was able to reach up to the hooks while manipulating the washers within the tips of her fingers in preparation for placing them onto the hooks. Proximal support was tapered.  Pt. continues to work on improving LUE ROM, strength, FMC, and hand function skills in order to work towards improving, and maximizing independence with ADLs, and IADL tasks.   PERFORMANCE DEFICITS in functional skills including ADLs, IADLs, coordination, proprioception, ROM, strength, FMC, and GMC, cognitive skills including memory, and psychosocial skills including coping strategies, environmental adaptation, interpersonal interactions, and routines and behaviors.    IMPAIRMENTS are limiting patient from ADLs, IADLs, education, leisure, and social participation.    COMORBIDITIES may have co-morbidities  that affects occupational performance. Patient will benefit from skilled OT to  address above impairments and improve overall function.  MODIFICATION OR ASSISTANCE TO COMPLETE EVALUATION: Min-Moderate modification of tasks or assist with assess necessary to complete an evaluation.   OT OCCUPATIONAL PROFILE AND HISTORY: Detailed assessment: Review of records and additional review of physical, cognitive, psychosocial history related to current functional performance.   CLINICAL DECISION MAKING: Moderate - several treatment options, min-mod task modification necessary   REHAB POTENTIAL: Good   EVALUATION COMPLEXITY: Moderate      PLAN: OT FREQUENCY: 2x/week   OT DURATION: 12 weeks   PLANNED INTERVENTIONS: self care/ADL training, therapeutic exercise, therapeutic activity, neuromuscular re-education, manual therapy, passive range of motion, functional mobility training, electrical stimulation, and paraffin   RECOMMENDED OTHER SERVICES: PT   CONSULTED AND AGREED WITH PLAN OF CARE: Patient and family member/caregiver   PLAN FOR NEXT SESSION: see above   Olegario Messier, MS, OTR/L  12/29/23, 11:05 PM

## 2023-12-31 ENCOUNTER — Ambulatory Visit: Payer: Medicare HMO | Admitting: Physical Therapy

## 2023-12-31 ENCOUNTER — Ambulatory Visit: Payer: Medicare HMO | Admitting: Occupational Therapy

## 2024-01-02 NOTE — Discharge Instructions (Signed)
Instructions after Total Knee Replacement   [name] P. Angie Fava., M.D.    Dept. of Orthopaedics & Sports Medicine [facility] [address] [place], [place]  [address]  Phone: [phone]   Fax: [phone]       [email or web address]       DIET: Drink plenty of non-alcoholic fluids. Resume your normal diet. Include foods high in fiber.  ACTIVITY:  You may use crutches or a walker with weight-bearing as tolerated, unless instructed otherwise. You may be weaned off of the walker or crutches by your Physical Therapist.  Do NOT place pillows under the knee. Anything placed under the knee could limit your ability to straighten the knee.   Continue doing gentle exercises. Exercising will reduce the pain and swelling, increase motion, and prevent muscle weakness.   Please continue to use the TED compression stockings for 6 weeks. You may remove the stockings at night, but should reapply them in the morning. Do not drive or operate any equipment until instructed.  WOUND CARE:  Continue to use the PolarCare or ice packs periodically to reduce pain and swelling. You may bathe or shower after the staples are removed at the first office visit following surgery.  MEDICATIONS: You may resume your regular medications. Please take the pain medication as prescribed on the medication. Do not take pain medication on an empty stomach. You have been given a prescription for a blood thinner (Lovenox or Coumadin). Please take the medication as instructed. (NOTE: After completing a 2 week course of Lovenox, take one Enteric-coated aspirin once a day. This along with elevation will help reduce the possibility of phlebitis in your operated leg.) Do not drive or drink alcoholic beverages when taking pain medications.  CALL THE OFFICE FOR: Temperature above 101 degrees Excessive bleeding or drainage on the dressing. Excessive swelling, coldness, or paleness of the toes. Persistent nausea and  vomiting.  FOLLOW-UP:  You should have an appointment to return to the office in 10-14 days after surgery. Arrangements have been made for continuation of Physical Therapy (either home therapy or outpatient therapy).     [facility] Department Directory         [email or web address]       [email or web address]          Cardiology  Appointments: [place] - [phone]  Endocrinology  Appointments: [place] [phone] [place] - [phone]  Gastroenterology  Appointments: [place] [phone] [place] - [phone]        General Surgery   Appointments: [facility]  Internal Medicine/Family Medicine  Appointments: [facility] - [phone] [place] - [phone]  Metabolic and Weigh Loss Surgery  Appointments: [facility]        Neurology  Appointments: [place] [phone] [place] - [phone]  Neurosurgery  Appointments: [place]  Obstetrics & Gynecology  Appointments: [place] [phone] [place] - [phone]        Pediatrics  Appointments: [place] [phone] [place] - [phone]  Physiatry  Appointments: [employer] [phone]  Physical Therapy  Appointments: [place] - [phone]        Podiatry  Appointments: [place] [phone] [place] - [phone]  Pulmonology  Appointments: [place]  Rheumatology  Appointments: [place] [phone]        [place] Location: [facility]  [address] [place], [place]  [address]  [place] Location: [facility] [address] [place], [place]  [address]  [place] Location: [facility] [address]  [address] [place], [place]  [address]

## 2024-01-05 ENCOUNTER — Ambulatory Visit: Payer: Medicare HMO | Admitting: Physical Therapy

## 2024-01-05 ENCOUNTER — Ambulatory Visit: Payer: Medicare HMO | Admitting: Occupational Therapy

## 2024-01-05 DIAGNOSIS — R531 Weakness: Secondary | ICD-10-CM | POA: Diagnosis not present

## 2024-01-05 DIAGNOSIS — R278 Other lack of coordination: Secondary | ICD-10-CM | POA: Diagnosis not present

## 2024-01-05 DIAGNOSIS — M6281 Muscle weakness (generalized): Secondary | ICD-10-CM | POA: Diagnosis not present

## 2024-01-05 DIAGNOSIS — R262 Difficulty in walking, not elsewhere classified: Secondary | ICD-10-CM

## 2024-01-05 DIAGNOSIS — R42 Dizziness and giddiness: Secondary | ICD-10-CM | POA: Diagnosis not present

## 2024-01-05 DIAGNOSIS — R2681 Unsteadiness on feet: Secondary | ICD-10-CM

## 2024-01-05 DIAGNOSIS — I6381 Other cerebral infarction due to occlusion or stenosis of small artery: Secondary | ICD-10-CM | POA: Diagnosis not present

## 2024-01-05 NOTE — Therapy (Addendum)
Occupational Therapy Treatment/Discharge Note      Patient Name: [name] MRN: [id number] DOB:[birth year], 83 y.o., female Today's Date: 01/05/2024  PCP: Dr. Judithann Sheen REFERRING PROVIDER:  Dr. Judithann Sheen   OT End of Session - 01/05/24 1652     Visit Number [id number]    Number of Visits 120    Date for OT Re-Evaluation 03/08/24    OT Start Time 1530    OT Stop Time 1615    OT Time Calculation (min) 45 min    Equipment Utilized During Treatment Wheelchair    Activity Tolerance Patient tolerated treatment well    Behavior During Therapy WFL for tasks assessed/performed                          Past Medical History:  Diagnosis Date   Anemia    Arthritis    Asthma    uses inhaler just prior to surgery to avoid attack   Back pain    from previous injury   Complication of anesthesia    has woken  up during 2 different surgery   Depression    no current issue/treatment; situation   Gallstones    GERD (gastroesophageal reflux disease)    Hiatal hernia    patient does NOT have nerve/muscle disease   History of kidney stones    HLD (hyperlipidemia)    HTN (hypertension)    Hypothyroidism    Kidney stones    Knee pain    Nausea and vomiting 10/15/2022   Non-diabetic pancreatic hormone dysfunction years   pt. states pancreas does not function properly   Pancreatitis    Pneumonia    Seizures (HCC)    caused by dye injected during a procedure   Shortness of breath    with exertion   Sinus problem    frequent infections/congestion   Stroke (HCC) 2021   reports having CVA in 2021 and having mini strokes before that   Thyroid disease    Past Surgical History:  Procedure Laterality Date   ABDOMINAL HYSTERECTOMY     APPENDECTOMY     CARPAL TUNNEL RELEASE  10+ years ago   bilateral   EYE SURGERY  3 yrs ago   bilateral cataracts   FOOT OSTEOTOMY  6 weeks ago   Left foot: great, 2nd & 3rd   FOOT OSTEOTOMY  5 years ago   Right great toe   HAND SURGERY  Bilateral 2011-most recent   multiple hand surgeries, 2 on left, 3 on right   KNEE ARTHROPLASTY Right 04/28/2022   Procedure: COMPUTER ASSISTED TOTAL KNEE ARTHROPLASTY;  Surgeon: Donato Heinz, MD;  Location: [facility];  Service: Orthopedics;  Laterality: Right;   LOOP RECORDER INSERTION N/A 05/16/2020   Procedure: LOOP RECORDER INSERTION;  Surgeon: Marcina Millard, MD;  Location: [facility];  Service: Cardiovascular;  Laterality: N/A;   NASAL SINUS SURGERY  most recent 7-8 yrs ago   7 sinus surgeries    TRIGGER FINGER RELEASE  11/19/2011   Procedure: RELEASE TRIGGER FINGER/A-1 PULLEY;  Surgeon: Nicki Reaper, MD;  Location: [facility];  Service: Orthopedics;  Laterality: Right;  release a-1 pulley right index finger and cyst removal   WRIST GANGLION EXCISION  1980's   right   Patient Active Problem List   Diagnosis Date Noted   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 11/27/2022   Hypomagnesemia 11/27/2022   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/23/2022   HLD (hyperlipidemia) 11/23/2022   Asthma 11/23/2022   Depression with anxiety 11/23/2022   Chronic diastolic CHF (congestive heart failure) (HCC) 11/23/2022   Normocytic anemia 11/23/2022   Aspiration pneumonia (HCC) 11/23/2022   AKI (acute kidney injury) (HCC) 11/23/2022   Abdominal pain 11/23/2022   Mesenteric mass 11/23/2022   Coffee ground emesis 11/23/2022   Nausea and vomiting 10/15/2022   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   Fatigue 09/17/2022   Left hemiparesis (HCC) 09/17/2022   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HCC) 08/22/2022   GERD (gastroesophageal reflux disease) 08/21/2022   Agitation 08/20/2022   Acute left-sided weakness 08/20/2022   Expressive aphasia    Stroke (HCC) 08/19/2022   Leukocytosis 08/19/2022   History of urticaria 04/28/2022   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   Primary osteoarthritis of left knee 02/24/2022   Primary osteoarthritis of right knee  02/24/2022   Lumbar spondylolysis 04/12/2020   History of CVA (cerebrovascular accident) 03/26/2020   Low back pain radiating to right lower extremity 03/21/2020   B12 deficiency 03/06/2020   Positive anti-CCP test 12/21/2019   Arthralgia 12/13/2019   Dermatitis 12/13/2019   Rheumatoid factor positive 12/13/2019   Essential hypertension 12/11/2018   Palpitations 12/11/2018   Acquired hypothyroidism 11/10/2018   Arthritis of knee 09/17/2016   Anxiety 11/22/2014   Asthma without status asthmaticus 11/22/2014   Benign neoplasm of colon, unspecified 11/22/2014   Environmental allergies 11/22/2014   Hypertriglyceridemia 11/22/2014   Hypokalemia 11/22/2014   Personal history of disease of skin and subcutaneous tissue 11/22/2014   REFERRING DIAG: CVA   THERAPY DIAG:  Muscle weakness (generalized)   Other lack of coordination   Rationale for Evaluation and Treatment Rehabilitation   SUBJECTIVE:   SUBJECTIVE STATEMENT:  Pt. R is scheduled for surgery knee surgery next week  Pt accompanied by: significant other   PERTINENT HISTORY: Patient is an 83 year-old female who was admitted to [facility] on 11/23/2022 with a cerebellar CVA. 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 Pt. Was admitted to Inpatient Rehab from 12/21-1/02/2023. 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  Patient underwent inpatient rehabilitation for 2 weeks.  Patient was assessed and was scheduled for a knee replacement on 08/29/2022 however had to cancel it due to having had a CVA.  Patient had a recent fall 2 days after discharging from inpatient rehab. Past Medical History includes: Knee replacement, essential HTN, hypokalemia, leukocytosis, seizures, positive anti-- CCP test, anxiety disorder, mini strokes.  Patient had shingles with left eye nerve pain s/p 1 year ago.    PRECAUTIONS: Fall   WEIGHT BEARING RESTRICTIONS No   PAIN:   Are you having pain?  No   FALLS: Has patient fallen in last 6 months? Yes. Number of falls 1   LIVING ENVIRONMENT: Lives with: Lives with Spouse Lives in: House/apartment Stairs: 2 storey home, resides on the first floor.  External: 2 stairs front no rails, and 6 in back with rails Has following equipment at home: Single point cane, Walker - 2 wheeled, Environmental consultant - 4 wheeled, Shower bench, and bed side commode   PLOF: Independent   PATIENT GOALS  To Regain the use of her left arm   OBJECTIVE:    HAND DOMINANCE: Right   ADLs: Overall ADLs: Husband assists pt. as needed Transfers/ambulation related to ADLs:Pt. Uses a 3 wheeled walker with Husband assist. Eating: Pt. Is independent with the right hand. Pt. has difficulty cutting food.  Grooming: Pt. is using her right hand, however has difficulty sustaining her LUE in elevation to assist with haircare. UB Dressing: Pt. Is independent donning a pullover shirt, and button down shirt. Has difficulty with buttoning, LB Dressing:  Independent donning pants, and socks. Difficulty tying shoes. Toileting: Independent Bathing: Pt. Is able to engage her right hand. Tub Shower transfers: Supervision Equipment: See above for equipment    IADLs: Shopping:  Has not had the opportunity for grocery shopping yet Light housekeeping: Husband is assisting with light house keeping Meal Prep:  Dependent Community mobility: Relies of family/friends Medication management: Husband assisting with weekly pillbox set-up, and administering medication. Financial management: TBD Handwriting: 75% legible   MOBILITY STATUS: Hx of falls   POSTURE COMMENTS:  No Significant postural limitations Sitting balance: supported sitting balance WFL   ACTIVITY TOLERANCE: Activity tolerance:  Fatigues in greater than 30 min.    FUNCTIONAL OUTCOME MEASURES: FOTO: 57   UPPER EXTREMITY ROM      Active ROM Right Eval: WFL Left eval Left  01/22/23 Left  03/03/23 Left   04/14/23 Left  05/28/2023 Left 07/23/2023 Left 08/20/2023 Left 09/17/23 Left 10/29/23 Left 10/29/23 Left 01/04/2023  Shoulder flexion   132 100 108 108 108 60(70) 70(78) 70(102) 82(110) 87(110) 94(105)  Shoulder abduction   80 85 85 85 85 65(78) 72(85) 73(94) 75(98) 75(103) 75(93)  Shoulder adduction                Shoulder extension                Shoulder internal rotation                Shoulder external rotation                Elbow flexion   140 140 [facility] WFL  Elbow extension   WNL WNL [facility] WFL WFL  Wrist flexion   65 68           Wrist extension   -10 20 24  32 32 -10 20 20 26  32 40  Wrist ulnar deviation     12 10 14 14 10 18 18 20 18 20   Wrist radial deviation     8 14 14 16 12 14 14 14 20 22   Wrist pronation                Wrist supination                (Blank rows = not tested)   Left digit flexion to [facility]: 2nd: 0cm, 3rd: 0cm, 4th: 0cm, 5th: 0cm   Limited Left full 2nd digit extension     UPPER EXTREMITY MMT:      MMT Right Eval: 4+/5 overall Right 07/23/2023 Left Eval Left 01/22/23 Left 03/03/23 Left  04/14/2023 Left 05/28/2023 Left 07/23/2023 Left 08/20/2023 Left 09/17/23 Left  10/29/23 Left 10/14/24 Left 01/05/2024  Shoulder flexion   3+/5 3/5 3-/5 3-/5 3-/5 3-/5 2/5 2+/5 2+/5 3-/5 3-/5 3/5  Shoulder abduction   3+/5 3-/5 3-/5 3-/5 3-/5 3-/5 2/5 2+/5 2+/5 3-/5 3-/5 3-/5  Shoulder adduction                 Shoulder extension                 Shoulder internal rotation  Shoulder external rotation                 Middle trapezius                 Lower trapezius                 Elbow flexion   4/5 3+/5 4/5 N/A 4/5 4/5 3+/5 3+/5 3+/5 3+/5 4-/5 4-/5  Elbow extension   4/5 3+/5 4/4 N/A 4/5 4/5 3+/5 3+/5 4-/5 4-/5 4/5 4/5  Wrist flexion                 Wrist extension   4/5 2-/5 3-/5 3-/5 3/5 3+/5 3-/5 3+/5 3+/5 3+/5 3+/5 3+/5  Wrist ulnar deviation                 Wrist radial deviation                  Wrist pronation                 Wrist supination                 (Blank rows = not tested)   HAND FUNCTION: Grip strength: Right: 26#, Left: 10# Pinch strength: Right 8#, Left: 3#, 3 Pt. Pinch strength: Right: 9#, L: 2#  01/22/2023 Grip strength: Right: 26#, Left: 12# Pinch strength:  Pinch meter used at the initial eval has been sent out for recalibration   03/03/2023 Grip strength: Right: 26#, Left: 13# Pinch strength:  Pinch meter used at the initial eval has been sent out for recalibration  04/14/2023: Grip strength: Right: 26#, Left: 13# Pinch strength:   Right 8#, Left: 4#, 3 Pt. Pinch strength: Right: 9#, L: 3#    05/28/2023: Grip strength: Right: 26#, Left: 13# Pinch strength:   Right 8#, Left: 2#, 3 Pt. Pinch strength: Right: 9#, L: 4#  07/23/2023  Grip strength: Right: 5#, Left: 2# Pinch strength:   Right 6#, Left: 2#, 3 Pt. Pinch strength: Right: 5#, L: 2#  08/20/2023:    Grip strength: Right: 8#, Left: 4# Pinch strength:   Right 6#, Left: 2#, 3 Pt. Pinch strength: Right: 5#, L: 2#    09/17/23:  Grip strength: Right: 5#, Left: 4# Pinch strength:   Right 6#, Left: 2#, 3 Pt. Pinch strength: Right: 5#, L: 2#  10/28/2024  Grip strength: Right: 13#, Left: 6# Pinch strength:   Right 6#, Left: 2#, 3 Pt. Pinch strength: Right: 5#, L: 2#  12/15/2023:  Grip strength: Right: 13#, Left:10# Pinch strength:  Right 6#, Left: 0#, 3 Pt. Pinch strength: Right: 5#, L: 4#   01/05/2024:  Grip strength: Right: 7#, Left: 4# Pinch strength:  Right 6#, Left: 0#, 3 Pt. Pinch strength: Right: 5#, L: 4#     COORDINATION: Right: 22 sec., Left: <5 min. To place 7 pegs with increased compensation proximally in the trunk, and through reflexive associated reactions.  01/22/23 Right: 22 sec., Left: 3 min. & 4 sec.  03/03/23 Right: 22 sec., Left: 1 min. & 39 sec.  04/14/23   TBD  05/28/23 Right: 22 sec., Left:  5 min.  07/23/2023  Right: 33 sec., Left:  Pt. Is unable to grasp,  and place pegs into the pegboard. Pt. Was able to remove 9 vertical pegs in 34 sec.  08/20/2023:  Right: 27 sec., Left:  5 pegs placed in 5 min.; Pt. Was able to remove 9 vertical pegs in 28 sec.  09/17/23:  Right: 26 sec., Left:  3 min, & 49 sec.    10/28/2024:  Right: 26 sec., Left:  2 min, & 10 sec.  12/15/23  Right: 26 sec., Left:  4 min, & 8 sec.  01/05/2024  Right: 26 sec., Left:  3 min. & 49 sec.  SENSATION: Light touch: WFL, proprioceptive awareness: Intact   EDEMA: N/A   MUSCLE TONE: LUE: Hypotonic   COGNITION: Overall cognitive status: WFL for tasks assessed. Pt. Is impulsive at times.   VISION: Subjective report: Pt. report having shingles affecting left eye  s/p 1 year. Has nerve pain Baseline vision: Wears glasses for reading only Visual history:  updated see clinical impression   VISION ASSESSMENT:    WFL for tasks performed   PERCEPTION: Intact   PRAXIS: Impaired: Motor planning   OBSERVATIONS:  Pt. more alert, and engaging since prior to the most recent hospitalization.    TODAY'S TREATMENT:    Therapeutic ex.:  Pt. tolerated AAROM stretches for the left shoulder in all ranges. Pt. worked on [employer], and reciprocal motion using the UBE while seated for 8 min. with minimal resistance.       PATIENT EDUCATION: Education details: LUE therapeutic exercise Person educated: Patient and Spouse Education method: Medical illustrator Education comprehension: verbalized understanding, returned demonstration, and needs further education   HOME EXERCISE PROGRAM:   Reviewed activities at home to promote isolated 2nd digit extension.    GOALS: Goals reviewed with patient? Yes   SHORT TERM GOALS: Target date: 12/10/2023   1. Patient will be independent with home exercise program for the left upper extremity Baseline: D/C:  Independent 12/15/2023: Independent. 10/29/2023: Independent 09/17/23: Independent 08/20/23: Pt. continues to  require assist from he husband for HEPs 07/23/2023: Pt. continues to require assist. 05/28/2023: Pt. Requires assist 04/09/2023: Pt. Continues to consistently attempt to engage her hand at hand 02/01/2023: Pt. Consistently attempts to perform HEPs independently. No current home exercise program Goal status:  Met   LONG TERM GOALS: Target date:  01/21/2024  1.  Patient will improve left shoulder strength by 2 mm grades to be able to sustain UE's in elevation long enough to wash her hair.  Baseline: D/C:  Left shoulder flexion 3-/5, abduction: 3-/5 12/15/2023:  Left shoulder flexion 3-/5, abduction: 3-/5 10/29/2023: Left shoulder flexion 3-/5, abduction: 3-/5 10/27/2023:  Pt. Is improving with reaching up to reach her hair for hair care. Pt. Continues to have difficulty sustaining the UE in elevation long enough to perform hair care. 09/17/2023: Left shoulder 2+/5, abduction 2+/5 08/20/2023: Left shoulder flexion 2+/5, abduction 2+/5  07/23/2023: Left shoulder flexion: 2/5, abduction: 2/5. 05/28/2023: Left shoulder flexion: 3-/5, abduction: 3-/5. Pt. has an old left shoulder injury limiting progression with left shoulder strength 05/26/2023: Pt. continues to present with limitiations in sustaining LUE elevation long enough to perform hair care thoroughly 04/09/2023: Pt. is limited with sustaining LUE elevation long enough to perform hair care thoroughly. 03/03/2023: Left shoulder flexion: 3-/5, abduction: 3-/5 01/22/2023: Left shoulder flexion: 3-/5, abduction: 3-/5 Eval: Left shoulder flexion: 3/5, abduction: 3-/5 Goal status: Ongoing   2.  Patient will improve left shoulder active abduction to be able to comb her hair Baseline: D/C: 75(93) 12/15/2023: 75(103) Pt. Has difficulty using her left hand to reach the right side, and back of her hair. 10/29/2023: Left shoulder abduction: 75(98) 10/27/2023: Pt. Is improving with reaching the left side of her hair for haircare. Pt. Has difficulty reaching with the left side to  the back,  and right side of her head.09/17/2023: 73(94) Pt. is able to reach her left side, and back of hair. 9/11/09/2023: 72(85) Pt. is able to reach her left side, and back of hair. 07/23/2023: Left shoulder flexion: 60(70) abduction: 65(78) 05/28/2023: left shoulder flexion: 108 abduction: 85 Pt. Has an old left shoulder injury limiting progression with left shoulder ROM 05/26/2023:  5/10 left shoulder pain with ROM limits using it functionally during hair care. 04/09/2023: Pt. Presents with difficulty abducting her left shoulder enough to thoroughly complete haircare. 03/03/2023: Shoulder abduction: 85 01/22/2023: Shoulder abduction: 85 Eval: Left shoulder abduction is 80(108) Goal status: Ongoing   3.  Patient will independently button her shirt with modified independence. Baseline:D/C: Pt. Continues to have inconsistencies with buttoning1/06/2024:  Pt.is engaging her left hand more, continues to have difficulty with buttoning.10/29/2023: Pt. Has difficulty manipulating buttons, will benefit from a buttonhook.10/27/23: Pt. Requires increased time to complete buttons.09/17/2023: Pt. Has a buttonhook, however does not know where it is. Pt. requires increased time to complete, and assist from her husband. 08/20/2023: Pt. requires increased time to complete, and assist from her husband. 07/23/2023 Pt. presents with difficulty buttoning her shirt. 05/28/2023: pt. Continues to work towards progressing with buttoning 05/26/2023:  Pt. Continues to progress towards buttoning. 04/09/2023: Pt. Continues to progress towards buttoning. 03/03/2023: Pt. Continues to have difficulty with buttoning. 01/22/2023: Pt. continues to have difficulty. Eval: Patient has difficulty.  Goal status: Not met   4.  Patient well improve left grip strength in preparation for securely hold a glass/beverage Baseline: D/C: Grip strength: Right: 7#  Left: 4#  12/15/2023:  Grip strength: Right: 13#  Left: 10# 10/29/2023: Grip strength: Right: 13#  Left:  6#10/27/2023: Pt. Has difficulty securely holding heavier items with larger diameter. 09/17/2023: Grip strength: Right: 5#, Left: 4# Pt. Has difficulty holding a glass. Right: 8#, Left: 4# Pt. Is able to more securely hold mirrors, and flowers with the left hand, however has difficulty holding a glass beverage, or bottle. 08/20/2023: Right: 8#, Left: 4# Pt. Is able to more securely hold mirrors, and flowers with the left hand, however has difficulty holding a glass beverage, or bottle. 07/23/2023: Grip strength: Right: 5#, Left: 2# 05/28/2023: Right: 26#, Left: 13#  pt. Presents with difficulty securely holding flowers in her left hand. 05/26/2023: TBD 04/09/2023: Pt. Is able to hold, and hike pants with the left hand, continues to have difficulty with securely holding flowers.   03/03/2023: left grip strength: 13# 01/22/2023: Left: 12# Eval: Pt. Is unable to securely hold flowers. Goal status: Partially met   5.  Pt. will independently recall adaptive strategies for performing ADL/ADL kitchen tasks.  Baseline: D/C: Independent from a seated position.12/15/2023: Independent 10/29/2023: Continue 11/19/204: Continue 08/20/2023: Revised to include IADL kitchen tasks.  07/23/2023: Continue 05/28/2023: Pt. Needs continued education about adaptive strategies.05/26/2023: Continue 04/09/2023: Continue3/26/2024: Continue 01/22/2023: Pt. continues to benefit from education about adaptive strategies during ADLs, and IADLs. Eval: Pt. to be provided with adaptive strategies. Goal status: Met   6.  Pt. will improve FOTO score by 2 points to reflect patient perceived performance improvement assessment specific ADLs  and IADLs Baseline: D/C: N/A 12/15/2023: 73 10/29/2023: 66 10/27/2023: TBD 09/17/2023: FOTO score: 61 07/23/2023: FOTO score: 57 05/28/2023: FOTO score 59 05/26/2023: TBD 5/02/204:  TBD 03/03/23: FOTO 62 Eval: 57 Goal status: N/A  7.  Pt. will improve left hand coordination skills in order to be able to  manipulate small  objects.    Baseline: D/C: Left: 3 min. &  49 sec. 12/15/2023: 4 min. & 8 sec. 10/29/2023 Left: 2 min. & 10 sec. 10/27/2023: Pt. Is improving with manipulating small objects.09/17/2023: Right: 26 sec., Left:  3 min, & 49 sec. pegs placed in 5 min.; Pt. Was able to remove 9 vertical pegs in 24 sec. 08/20/2023: Right: 27 sec., Left:  5 pegs placed in 5 min.; Pt. Was able to remove 9 vertical pegs in 28 sec. Pt. Is able to hold, and sort utensils in a drawer. Pt. Has difficulty manipulating small objects. 07/23/2023: Right: 33 sec., Left:  Pt. Is unable to grasp, and place pegs into the pegboard. Pt. Was able to remove 9 vertical pegs in 34 sec. 05/28/2023: Left:  5 min. 05/26/2023 Pt. Continues to present with difficulty handling and sorting utensils. 04/14/2023: 56 04/09/2023: TBD 03/03/23: 1 min. & 39 01/22/2022: Left: 3 min. & 4 sec. Eval: Pt. has difficulty sorting, and placing utensils with the left hand. Left FMC : >5 min. For 7 pegs on the 9 hole peg test.    Goal Status: Partially met, however presents with significant left hand FMC impairments with the left hand.  8. Pt. will improve active left 2nd digit extension to be able able to isolate her 2nd digit in preparation for pressing/pushing buttons on appliances, phones, or remotes. Baseline: D/C: Improved isolation of 2nd digit extension in preparation for pressing items.12/15/2023: Pt. Continues to improve with 2nd digit isolation, to however continues to present with limited prolonged 2nd digit extension 10/29/2023: Pt. Is improving with 2nd digit isolation, to however continues to present with limited prolonged 2nd digit extension11/19/2024: Pt. Is improving with 2nd digit isolation, to however presents with limited prolonged 2nd digit extension. 09/17/2023: Pt. Presents with limited isolated 2nd digit extension with increased time. 08/20/2023: Pt. Is more consistently able to isolate 2nd digit extension with increased time. 07/23/2023: Pt. Continues to work on  consistency with isolating left 2nd digit extension 05/28/2023: Pt. Continues to improve with left 2nd digit extension 05/26/2023: Improving with left 2nd digit extension. 04/09/2023: Pt. Is progressing with isolating left 2nd digit extension, however continues to present with limited increased flexor tone. 03/03/23: Pt. Continues to work on improving consistency with 2nd digit extension to press the remote. 01/22/2023: is able to perform full digit extension, although 2nd digit is slow to extend s 2/2 flexor tone. Pt.  Eval: Pt. is able to is unable to actively perform full digit extension Goal status: Ongoing  9: Pt. Will cut food with modified independence Baseline: D/C: Pt. continues to present with difficulty cutting food on her plate. 12/15/2023: Pt. continues to have difficulty cutting food on her plate 16/09/9603: Pt. continues to have difficulty cutting food on her plate.10/27/2023: Pt. Continues to have difficulty cutting food on her plate. 09/17/2023: Pt. Is unable to cut the food on her plate, as well as cut food for meal preparation. 08/20/2023: Pt. is unable to cut the food on her plate, as well as cut food for meal preparation.    Goal Status: Not met   ASSESSMENT:   CLINICAL IMPRESSION:  Pt. Is discharging from therapy services for her stroke rehabilitation to have an elective left knee replacement surgery. Pt. has made progress overall with LUE functioning. Pt. continues to present with limited LUE ROM, strength, and [facility] skills which affect her ability to engage in daily ADLs, and IADL tasks.  PERFORMANCE DEFICITS in functional skills including ADLs, IADLs, coordination, proprioception, ROM, strength, FMC, and GMC, cognitive skills including memory, and psychosocial skills including  coping strategies, environmental adaptation, interpersonal interactions, and routines and behaviors.    IMPAIRMENTS are limiting patient from ADLs, IADLs, education, leisure, and social participation.     COMORBIDITIES may have co-morbidities  that affects occupational performance. Patient will benefit from skilled OT to address above impairments and improve overall function.   MODIFICATION OR ASSISTANCE TO COMPLETE EVALUATION: Min-Moderate modification of tasks or assist with assess necessary to complete an evaluation.   OT OCCUPATIONAL PROFILE AND HISTORY: Detailed assessment: Review of records and additional review of physical, cognitive, psychosocial history related to current functional performance.   CLINICAL DECISION MAKING: Moderate - several treatment options, min-mod task modification necessary   REHAB POTENTIAL: Good   EVALUATION COMPLEXITY: Moderate      PLAN: OT FREQUENCY: 2x/week   OT DURATION: 12 weeks   PLANNED INTERVENTIONS: self care/ADL training, therapeutic exercise, therapeutic activity, neuromuscular re-education, manual therapy, passive range of motion, functional mobility training, electrical stimulation, and paraffin   RECOMMENDED OTHER SERVICES: PT   CONSULTED AND AGREED WITH PLAN OF CARE: Patient and family member/caregiver   PLAN FOR NEXT SESSION: see above   Olegario Messier, MS, OTR/L  01/05/24, 5:14 PM

## 2024-01-05 NOTE — Therapy (Unsigned)
OUTPATIENT PHYSICAL THERAPY TREATMENT/ Re-certification.     Patient Name: [name] MRN: [id number] DOB:12/14/1940, 83 y.o., female Today's Date: 01/05/2024   PCP: Aram Beecham, D MD REFERRING PROVIDER: Jacquelynn Cree PA   END OF SESSION:  PT End of Session - 01/05/24 1625     Visit Number 28    Number of Visits 31    Date for PT Re-Evaluation 01/05/24    Authorization Type [employer]:    Authorization Time Period approved for 24 additional tx from 09/28/23 to 12/24/23; total visit count 31    Progress Note Due on Visit 30    PT Start Time 1620    PT Stop Time 1700    PT Time Calculation (min) 40 min    Equipment Utilized During Treatment Gait belt    Activity Tolerance Patient tolerated treatment well    Behavior During Therapy WFL for tasks assessed/performed                  Past Medical History:  Diagnosis Date   Anemia    Arthritis    Asthma    uses inhaler just prior to surgery to avoid attack   Back pain    from previous injury   Complication of anesthesia    has woken  up during 2 different surgery   Depression    no current issue/treatment; situation   Gallstones    GERD (gastroesophageal reflux disease)    Hiatal hernia    patient does NOT have nerve/muscle disease   History of kidney stones    HLD (hyperlipidemia)    HTN (hypertension)    Hypothyroidism    Kidney stones    Knee pain    Nausea and vomiting 10/15/2022   Non-diabetic pancreatic hormone dysfunction years   pt. states pancreas does not function properly   Pancreatitis    Pneumonia    Seizures (HCC)    caused by dye injected during a procedure   Shortness of breath    with exertion   Sinus problem    frequent infections/congestion   Stroke (HCC) 2021   reports having CVA in 2021 and having mini strokes before that   Thyroid disease    Past Surgical History:  Procedure Laterality Date   ABDOMINAL HYSTERECTOMY     APPENDECTOMY     CARPAL TUNNEL RELEASE  10+  years ago   bilateral   EYE SURGERY  3 yrs ago   bilateral cataracts   FOOT OSTEOTOMY  6 weeks ago   Left foot: great, 2nd & 3rd   FOOT OSTEOTOMY  5 years ago   Right great toe   HAND SURGERY Bilateral 2011-most recent   multiple hand surgeries, 2 on left, 3 on right   KNEE ARTHROPLASTY Right 04/28/2022   Procedure: COMPUTER ASSISTED TOTAL KNEE ARTHROPLASTY;  Surgeon: Donato Heinz, MD;  Location: [facility];  Service: Orthopedics;  Laterality: Right;   LOOP RECORDER INSERTION N/A 05/16/2020   Procedure: LOOP RECORDER INSERTION;  Surgeon: Marcina Millard, MD;  Location: [facility];  Service: Cardiovascular;  Laterality: N/A;   NASAL SINUS SURGERY  most recent 7-8 yrs ago   7 sinus surgeries    TRIGGER FINGER RELEASE  11/19/2011   Procedure: RELEASE TRIGGER FINGER/A-1 PULLEY;  Surgeon: Nicki Reaper, MD;  Location: [facility];  Service: Orthopedics;  Laterality: Right;  release a-1 pulley right index finger and cyst removal   WRIST GANGLION EXCISION  1980's  right     ONSET DATE: 11/23/22  REFERRING DIAG: TIA  THERAPY DIAG:  Dizziness and giddiness  Other lack of coordination  Difficulty in walking, not elsewhere classified  Muscle weakness (generalized)  Unsteadiness on feet  Rationale for Evaluation and Treatment: Rehabilitation  SUBJECTIVE:                                                                                                                                                                                             SUBJECTIVE STATEMENT:   Pt reports that she is doing well. States that she will have pre-op appointment with with Dr Elenor Legato office on 1/30 and TKA on 2/5.  No falls or stumbles since last visit.    Pt accompanied by: significant other  PERTINENT HISTORY: [name] is an 65 year-old female who was admitted to [facility] on 11/23/2022 with a cerebellar CVA. 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 Pt. Was admitted to Inpatient Rehab from 12/21-1/02/2023. 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  Patient underwent inpatient rehabilitation for 2 weeks.  Patient was assessed and was scheduled for a knee replacement on 08/29/2022 however had to cancel it due to having had a CVA. PMH includes: anemia, arthritis, back pain, depression, hernia, GERD, HLD, HTN, hypothyroidism, knee pain, pancreatic hormone dysfunction, pneumonia, seizures, stroke, thyroid disease.  Patient has a donjoy brace for L knee.   PAIN:  Are you having pain? no  PRECAUTIONS: Fall, has latex allergy  WEIGHT BEARING RESTRICTIONS: No  FALLS: Has patient fallen in last 6 months? Yes. Number of falls 4 falls   LIVING ENVIRONMENT: Lives with: lives with their family and lives with their spouse Lives in: House/apartment Stairs: Yes: Internal: flight steps;   and External: 2 steps; on right going up and on left going up Has following equipment at home: Single point cane, Walker - 2 wheeled, Shower bench, and bed side commode  PLOF: Independent  PATIENT GOALS: to have less pain and move better  OBJECTIVE:   TODAY'S TREATMENT: DATE: 01/05/24  PT instructed pt in goal assessment: see below   Gait with RW x 185ft. Pt required prolonged therapeutic rest break and use of rescue inhaler due to increased SOB. Husband assisted with use of rescue inhaler.   Encouraged to continue HEP provided by PT to maintain strength in RLE pruir to surgery.  HEP review x 8 bil with each LE.   PATIENT EDUCATION: Education details: Pt educated throughout session about proper posture and technique with exercises. Improved exercise technique, movement at target joints, use of target muscles  after min to mod verbal, visual, tactile cues.   HOME EXERCISE PROGRAM:  Access Code: [email or web address] URL: [email or web address] Date: 10/07/2023 Prepared by: Grier Rocher  Exercises - Standing March with Counter Support  - 1 x daily - 3 x weekly - 2 sets - 10 reps - Standing Hip Abduction with Unilateral Counter Support  - 1 x daily - 3 x weekly - 2 sets - 10 reps - 2 hold - Standing Hip Extension with Unilateral Counter Support  - 1 x daily - 3 x weekly - 2 sets - 10 reps - 2 hold - Supine Straight Leg Raises  - 1 x daily - 37 x weekly - 2 sets - 10 reps - Bridge  - 1 x daily - 3 x weekly - 2 sets - 10 reps - 2 hold - Supine Heel Slide  - 1 x daily - 3 x weekly - 2 sets - 10 reps  GOALS: Goals reviewed with patient? Yes  SHORT TERM GOALS: Target date: 09/19/23  Patient will be independent in starter HEP to improve A/ROM tolerance and preserve available ROM of LLE and to advance A/ROM and strength of RLE.  Baseline: 08/20/23: defer to vists 2-3  Goal status: MET  LONG TERM GOALS: Target date: 10/20/23  Patient will increase FOTO score to equal to or greater than 65%  to demonstrate statistically significant improvement in ease of mobility.  Baseline:  47%; 06/23/23: 60;  7/16: 60%  10/31: 54 11/19/23: 56 1/28: 58 Goal status: PROGRESSING   2.  Patient (> 54 years old) will complete five times sit to stand test in < 15 seconds indicating an increased RLE power.  Baseline: 5/7: unable to tolerate 7/16: 17.4 sec 10/31: 11.02 sec 11/19/23: 13.06 sec 1/28: 9.91 sec  Goal status: MET   3.   Patient to report tolerance of ad lib AMB in home during the day of distances >29ft >5x at supervision level or better to improve independence with ADL.    Baseline: 08/20/23: using transport chair for nearly all mobility, not tolerating walking due to WB limitations of LLE  10/31: able to ambulate 30-43ft with CGA from PT.  11/19/23: Pt able to ambulate 255.3' with CGA, FWW, and chair follow Goal status: PROGRESSING  1/28: 164ft with supervision assist and RW. Pt noted to have significat fatigue upon completion requiring 2 min rest break to continue as well  as use of inhaler.  Goal status: MET    ASSESSMENT:  CLINICAL IMPRESSION:    Pt has met 2 of 3 LTG and 1 of 1 STG due to improved tolerance and standing, increased strength in BLE and improved attention to task. Was noted to have decreased impulsivity, but did not notify PT of severe fatigue with ambulation requiring prolonged seated rest break and use of inhaler due to SOB. Pt demonstrates good technique for BLE strengthening for HEP in preparation of TKA. Pt will d/c from skilled PT at this time due to planned TKA next week. Will consider restarting PT following surgery, provided medically stable and appropriate.    OBJECTIVE IMPAIRMENTS: Abnormal gait, cardiopulmonary status limiting activity, decreased activity tolerance, decreased balance, decreased cognition, decreased endurance, decreased knowledge of use of DME, decreased mobility, difficulty walking, decreased ROM, decreased strength, decreased safety awareness, hypomobility, increased fascial restrictions, impaired perceived functional ability, impaired flexibility, impaired UE functional use, improper body mechanics, postural dysfunction, and pain.   ACTIVITY LIMITATIONS: carrying, lifting, bending, sitting, standing, squatting, stairs, transfers, bed mobility, bathing, toileting, dressing,  reach over head, hygiene/grooming, locomotion level, and caring for others  PARTICIPATION LIMITATIONS: meal prep, cleaning, laundry, medication management, personal finances, interpersonal relationship, driving, shopping, community activity, yard work, school, and church  PERSONAL FACTORS: Age, Behavior pattern, Education, Fitness, Past/current experiences, Time since onset of injury/illness/exacerbation, and 3+ comorbidities: anemia, arthritis, back pain, depression, hernia, GERD, HLD, HTN, hypothyroidism, knee pain, pancreatic hormone dysfunction, pneumonia, seizures, stroke, thyroid disease  are also affecting patient's functional outcome.    REHAB POTENTIAL: Fair    CLINICAL DECISION MAKING: Evolving/moderate complexity  EVALUATION COMPLEXITY: Moderate  PLAN:  PT FREQUENCY: 1-2x/week  PT DURATION: 10 weeks  PLANNED INTERVENTIONS: Therapeutic exercises, Therapeutic activity, Neuromuscular re-education, Balance training, Gait training, Patient/Family education, Self Care, Joint mobilization, Joint manipulation, Stair training, Vestibular training, Canalith repositioning, Visual/preceptual remediation/compensation, Orthotic/Fit training, DME instructions, Electrical stimulation, Wheelchair mobility training, Spinal mobilization, Cryotherapy, Moist heat, Compression bandaging, scar mobilization, Splintting, Taping, Traction, Ultrasound, Manual therapy, and Re-evaluation   PLAN FOR NEXT SESSION:  N/A  Grier Rocher PT, DPT  Physical Therapist - [place]  [facility]  4:27 PM 01/05/24

## 2024-01-07 ENCOUNTER — Encounter: Payer: Self-pay | Admitting: Urgent Care

## 2024-01-07 ENCOUNTER — Ambulatory Visit: Payer: Medicare HMO | Admitting: Occupational Therapy

## 2024-01-07 ENCOUNTER — Ambulatory Visit: Payer: Medicare HMO | Admitting: Physical Therapy

## 2024-01-07 ENCOUNTER — Encounter
Admission: RE | Admit: 2024-01-07 | Discharge: 2024-01-07 | Disposition: A | Discharge status: Home / Self Care | Payer: Medicare HMO | Source: Ambulatory Visit | Attending: Orthopedic Surgery | Admitting: Orthopedic Surgery

## 2024-01-07 ENCOUNTER — Other Ambulatory Visit: Payer: Self-pay

## 2024-01-07 VITALS — BP 102/56 | HR 69 | Resp 16 | Ht 59.0 in | Wt 106.0 lb

## 2024-01-07 DIAGNOSIS — Z01818 Encounter for other preprocedural examination: Secondary | ICD-10-CM | POA: Insufficient documentation

## 2024-01-07 DIAGNOSIS — R768 Other specified abnormal immunological findings in serum: Secondary | ICD-10-CM | POA: Diagnosis not present

## 2024-01-07 DIAGNOSIS — N179 Acute kidney failure, unspecified: Secondary | ICD-10-CM | POA: Diagnosis not present

## 2024-01-07 DIAGNOSIS — E876 Hypokalemia: Secondary | ICD-10-CM | POA: Diagnosis not present

## 2024-01-07 DIAGNOSIS — K920 Hematemesis: Secondary | ICD-10-CM

## 2024-01-07 DIAGNOSIS — R5383 Other fatigue: Secondary | ICD-10-CM

## 2024-01-07 DIAGNOSIS — M1712 Unilateral primary osteoarthritis, left knee: Secondary | ICD-10-CM | POA: Diagnosis not present

## 2024-01-07 DIAGNOSIS — D72829 Elevated white blood cell count, unspecified: Secondary | ICD-10-CM

## 2024-01-07 DIAGNOSIS — Z01812 Encounter for preprocedural laboratory examination: Secondary | ICD-10-CM

## 2024-01-07 DIAGNOSIS — D649 Anemia, unspecified: Secondary | ICD-10-CM | POA: Diagnosis not present

## 2024-01-07 DIAGNOSIS — Z8719 Personal history of other diseases of the digestive system: Secondary | ICD-10-CM | POA: Diagnosis not present

## 2024-01-07 HISTORY — DX: Chronic diastolic (congestive) heart failure: I50.32

## 2024-01-07 HISTORY — DX: Pneumonitis due to inhalation of food and vomit: J69.0

## 2024-01-07 HISTORY — DX: Occlusion and stenosis of right vertebral artery: I65.01

## 2024-01-07 HISTORY — DX: Other cerebrovascular disease: I67.89

## 2024-01-07 HISTORY — DX: Shortness of breath: R06.02

## 2024-01-07 LAB — URINALYSIS, ROUTINE W REFLEX MICROSCOPIC
Glucose, UA: NEGATIVE mg/dL
Hgb urine dipstick: NEGATIVE
Ketones, ur: NEGATIVE mg/dL
Nitrite: NEGATIVE
Protein, ur: NEGATIVE mg/dL
Specific Gravity, Urine: 1.018 (ref 1.005–1.030)
pH: 5 (ref 5.0–8.0)

## 2024-01-07 LAB — COMPREHENSIVE METABOLIC PANEL
ALT: 11 U/L (ref 0–44)
AST: 21 U/L (ref 15–41)
Albumin: 3.8 g/dL (ref 3.5–5.0)
Alkaline Phosphatase: 86 U/L (ref 38–126)
Anion gap: 12 (ref 5–15)
BUN: 10 mg/dL (ref 8–23)
CO2: 22 mmol/L (ref 22–32)
Calcium: 9.5 mg/dL (ref 8.9–10.3)
Chloride: 105 mmol/L (ref 98–111)
Creatinine, Ser: 0.98 mg/dL (ref 0.44–1.00)
GFR, Estimated: 58 mL/min — ABNORMAL LOW (ref >60)
Glucose, Bld: 95 mg/dL (ref 70–99)
Potassium: 4.1 mmol/L (ref 3.5–5.1)
Sodium: 139 mmol/L (ref 135–145)
Total Bilirubin: 0.6 mg/dL (ref 0.0–1.2)
Total Protein: 7.2 g/dL (ref 6.5–8.1)

## 2024-01-07 LAB — CBC
HCT: 29 % — ABNORMAL LOW (ref 36.0–46.0)
Hemoglobin: 8 g/dL — ABNORMAL LOW (ref 12.0–15.0)
MCH: 18.8 pg — ABNORMAL LOW (ref 26.0–34.0)
MCHC: 27.6 g/dL — ABNORMAL LOW (ref 30.0–36.0)
MCV: 68.2 fL — ABNORMAL LOW (ref 80.0–100.0)
Platelets: 1039 10*3/uL (ref 150–400)
RBC: 4.25 MIL/uL (ref 3.87–5.11)
RDW: 18.1 % — ABNORMAL HIGH (ref 11.5–15.5)
WBC: 12.4 10*3/uL — ABNORMAL HIGH (ref 4.0–10.5)
nRBC: 0 % (ref 0.0–0.2)

## 2024-01-07 LAB — TYPE AND SCREEN
ABO/RH(D): O POS
Antibody Screen: NEGATIVE

## 2024-01-07 LAB — SEDIMENTATION RATE: Sed Rate: 37 mm/h — ABNORMAL HIGH (ref 0–30)

## 2024-01-07 LAB — C-REACTIVE PROTEIN: CRP: 2.4 mg/dL — ABNORMAL HIGH (ref <1.0)

## 2024-01-07 LAB — SURGICAL PCR SCREEN
MRSA, PCR: NEGATIVE
Staphylococcus aureus: NEGATIVE

## 2024-01-07 NOTE — Patient Instructions (Addendum)
Your procedure is scheduled on: Wednesday 01/12/27 To find out your arrival time, please call [phone] between 1PM - 3PM on:  Tuesday 01/12/24  Report to the Registration Desk on the 1st floor of the [facility]. Free Valet parking is available.  If your arrival time is 6:00 am, do not arrive before that time as the [facility] entrance doors do not open until 6:00 am.  REMEMBER: Instructions that are not followed completely may result in serious medical risk, up to and including death; or upon the discretion of your surgeon and anesthesiologist your surgery may need to be rescheduled.  Do not eat food after midnight the night before surgery.  No gum chewing or hard candies.  You may however, drink CLEAR liquids up to 2 hours before you are scheduled to arrive for your surgery. Do not drink anything within 2 hours of your scheduled arrival time.  Clear liquids include: - water  - apple juice without pulp - gatorade (not RED colors) - black coffee or tea (Do NOT add milk or creamers to the coffee or tea) Do NOT drink anything that is not on this list.  Type 1 and Type 2 diabetics should only drink water.  In addition, your doctor has ordered for you to drink the provided:   Gatorade G2 (ensure out of stock) Drinking this carbohydrate drink up to two hours before surgery helps to reduce insulin resistance and improve patient outcomes. Please complete drinking 2 hours before scheduled arrival time.  One week prior to surgery: Stop Anti-inflammatories (NSAIDS) such as Advil, Aleve, Ibuprofen, Motrin, Naproxen, Naprosyn and Aspirin based products such as Excedrin, Goody's Powder, BC Powder. You may however, continue to take Tylenol if needed for pain up until the day of surgery.  Stop ANY OVER THE COUNTER supplements and vitamins TODAY 01/07/24 until after surgery. You can continue your iron and magnesium.  Continue taking all prescribed medications with the exception of the  following: Plavix is on hold until after surgery  TAKE ONLY THESE MEDICATIONS THE MORNING OF SURGERY WITH A SIP OF WATER:  amLODipine (NORVASC) 10 MG tablet  atorvastatin (LIPITOR) 40 MG tablet  DULoxetine (CYMBALTA) 30 MG capsule  levothyroxine (SYNTHROID) 50 MCG tablet  pantoprazole (PROTONIX) 40 MG tablet  propranolol ER (INDERAL LA) 60 MG 24 hr capsule   Use inhalers on the day of surgery and bring to the hospital.  No Alcohol for 24 hours before or after surgery.  No Smoking including e-cigarettes for 24 hours before surgery.  No chewable tobacco products for at least 6 hours before surgery.  No nicotine patches on the day of surgery.  Do not use any "recreational" drugs for at least a week (preferably 2 weeks) before your surgery.  Please be advised that the combination of cocaine and anesthesia may have negative outcomes, up to and including death. If you test positive for cocaine, your surgery will be cancelled.  On the morning of surgery brush your teeth with toothpaste and water, you may rinse your mouth with mouthwash if you wish. Do not swallow any toothpaste or mouthwash.  Use CHG Soap or wipes as directed on instruction sheet.  Do not wear lotions, powders, or perfumes on the day of surgery  Do not shave body hair from the neck down.   Wear clean comfortable clothing (specific to your surgery type) to the hospital.  Do not wear jewelry, make-up, hairpins, clips or nail polish.  For welded (permanent) jewelry: bracelets, anklets, waist bands,  etc.  Please have this removed prior to surgery.  If it is not removed, there is a chance that hospital personnel will need to cut it off on the day of surgery. Contact lenses, hearing aids and dentures may not be worn into surgery.  Do not bring valuables to the hospital. [facility] is not responsible for any missing/lost belongings or valuables.   Notify your doctor if there is any change in your medical condition (cold,  fever, infection).  If you are being discharged the day of surgery, you will not be allowed to drive home. You will need a responsible individual to drive you home and stay with you for 24 hours after surgery.   If you are taking public transportation, you will need to have a responsible individual with you.  If you are being admitted to the hospital overnight, leave your suitcase in the car. After surgery it may be brought to your room.  In case of increased patient census, it may be necessary for you, the patient, to continue your postoperative care in the Same Day Surgery department.  After surgery, you can help prevent lung complications by doing breathing exercises.  Take deep breaths and cough every 1-2 hours. Your doctor may order a device called an Incentive Spirometer to help you take deep breaths. When coughing or sneezing, hold a pillow firmly against your incision with both hands. This is called "splinting." Doing this helps protect your incision. It also decreases belly discomfort.  Surgery Visitation Policy:  Patients undergoing a surgery or procedure may have two family members or support persons with them as long as the person is not COVID-19 positive or experiencing its symptoms.   Inpatient Visitation:    Visiting hours are 7 a.m. to 8 p.m. Up to four visitors are allowed at one time in a patient room. The visitors may rotate out with other people during the day. One designated support person (adult) may remain overnight.  Due to an increase in RSV and influenza rates and associated hospitalizations, children ages 39 and under will not be able to visit patients in [facility]. Masks continue to be strongly recommended.  Please call the Pre-admissions Testing Dept. at [phone] if you have any questions about these instructions.    Pre-operative 5 CHG Bath Instructions   You can play a key role in reducing the risk of infection after surgery. Your skin  needs to be as free of germs as possible. You can reduce the number of germs on your skin by washing with CHG (chlorhexidine gluconate) soap before surgery. CHG is an antiseptic soap that kills germs and continues to kill germs even after washing.   DO NOT use if you have an allergy to chlorhexidine/CHG or antibacterial soaps. If your skin becomes reddened or irritated, stop using the CHG and notify one of our RNs at [phone].   Please shower with the CHG soap starting 4 days before surgery using the following schedule:   Saturday 01/09/24 - Wednesday 01/13/24    Please keep in mind the following:  DO NOT shave, including legs and underarms, starting the day of your first shower.   You may shave your face at any point before/day of surgery.  Place clean sheets on your bed the day you start using CHG soap. Use a clean washcloth (not used since being washed) for each shower. DO NOT sleep with pets once you start using the CHG.   CHG Shower Instructions:  If you  choose to wash your hair and private area, wash first with your normal shampoo/soap.  After you use shampoo/soap, rinse your hair and body thoroughly to remove shampoo/soap residue.  Turn the water OFF and apply about 3 tablespoons (45 ml) of CHG soap to a CLEAN washcloth.  Apply CHG soap ONLY FROM YOUR NECK DOWN TO YOUR TOES (washing for 3-5 minutes)  DO NOT use CHG soap on face, private areas, open wounds, or sores.  Pay special attention to the area where your surgery is being performed.  If you are having back surgery, having someone wash your back for you may be helpful. Wait 2 minutes after CHG soap is applied, then you may rinse off the CHG soap.  Pat dry with a clean towel  Put on clean clothes/pajamas   If you choose to wear lotion, please use ONLY the CHG-compatible lotions on the back of this paper.     Additional instructions for the day of surgery: DO NOT APPLY any lotions, deodorants, cologne, or perfumes.   Put on  clean/comfortable clothes.  Brush your teeth.  Ask your nurse before applying any prescription medications to the skin.      CHG Compatible Lotions   Aveeno Moisturizing lotion  Cetaphil Moisturizing Cream  Cetaphil Moisturizing Lotion  Clairol Herbal Essence Moisturizing Lotion, Dry Skin  Clairol Herbal Essence Moisturizing Lotion, Extra Dry Skin  Clairol Herbal Essence Moisturizing Lotion, Normal Skin  Curel Age Defying Therapeutic Moisturizing Lotion with Alpha Hydroxy  Curel Extreme Care Body Lotion  Curel Soothing Hands Moisturizing Hand Lotion  Curel Therapeutic Moisturizing Cream, Fragrance-Free  Curel Therapeutic Moisturizing Lotion, Fragrance-Free  Curel Therapeutic Moisturizing Lotion, Original Formula  Eucerin Daily Replenishing Lotion  Eucerin Dry Skin Therapy Plus Alpha Hydroxy Crme  Eucerin Dry Skin Therapy Plus Alpha Hydroxy Lotion  Eucerin Original Crme  Eucerin Original Lotion  Eucerin Plus Crme Eucerin Plus Lotion  Eucerin TriLipid Replenishing Lotion  Keri Anti-Bacterial Hand Lotion  Keri Deep Conditioning Original Lotion Dry Skin Formula Softly Scented  Keri Deep Conditioning Original Lotion, Fragrance Free Sensitive Skin Formula  Keri Lotion Fast Absorbing Fragrance Free Sensitive Skin Formula  Keri Lotion Fast Absorbing Softly Scented Dry Skin Formula  Keri Original Lotion  Keri Skin Renewal Lotion Keri Silky Smooth Lotion  Keri Silky Smooth Sensitive Skin Lotion  Nivea Body Creamy Conditioning Oil  Nivea Body Extra Enriched Teacher, adult education Moisturizing Lotion Nivea Crme  Nivea Skin Firming Lotion  NutraDerm 30 Skin Lotion  NutraDerm Skin Lotion  NutraDerm Therapeutic Skin Cream  NutraDerm Therapeutic Skin Lotion  ProShield Protective Hand Cream  Provon moisturizing lotion

## 2024-01-07 NOTE — Progress Notes (Addendum)
  [facility] Perioperative Services: Pre-Admission/Anesthesia Testing  Abnormal Lab Notification   Date: 01/07/24  Name: [name] MRN:   [id number]  Re: Abnormal labs noted during PAT appointment   Notified:    Provider Name Provider Role Notification Mode  Hooten, Illene Labrador, MD Orthopedics (Surgeon) Routed and/or faxed via Sandria Manly, MD Internal Medicine Routed and/or faxed via [facility]   ABNORMAL LAB VALUE(S):   Lab Results  Component Value Date   WBC 12.4 (H) 01/07/2024   HGB 8.0 (L) 01/07/2024   HCT 29.0 (L) 01/07/2024   MCV 68.2 (L) 01/07/2024   PLT 1,039 (HH) 01/07/2024   Clinical Information and Notes:  [name] is scheduled for a COMPUTER ASSISTED TOTAL KNEE ARTHROPLASTY (Left: Knee) on 01/13/2024.   Patient with several noted abnormalities on her preoperative CBC today.   Hemoglobin 8.0 g/dL with a hematocrit of 40.9 %. RBC indices are microcytic and hypochromic despite being on  chronic oral iron supplementation using Niferex (iron polysaccharides) . Hemoglobin has trended down from 11.3 g/dL when compared to when lab checked on 07/08/2023.I did obtain a type and screen today. With her down trending hemoglobin, I anticipated her being significantly anemic to the degree of potentially requiring transfusion. Type and screen available if needed.   Significantly elevated platelet count of 1,039 K/uL noted on today's preoperative labs. This is of particular concern, as patient has suffered multiple non-hemorrhagic neurological events in the past (BILATERAL cerebellum, RIGHT occipital pole (9mm), medial RIGHT occipital lobe).   Patient with known complete vertebral artery occlusion. She is on daily DAPT therapy using ASA + clopidogrel. Most recent CVA was in 2023 following elective orthopedic surgery. Patient is followed by neurology Sherryll Burger, MD) at this point; most recent office visit reviewed. MD advised patient specifically that "going through  with a major surgery such as knee surgery will increase your odds of another stroke".  Sending communication to primary attending surgeon Ernest Pine, MD) to make him aware of concerning lab results in the setting of patient with who has considerable risks for recurrent perioperative neurological event/insult. Need to determine whether or not further evaluation of noted blood dyscrasias is warranted prior to elective orthopedic surgery. Differentials include: anemia (IDA and ABLA), autoimmune condition, medication mediated, or potential underlying myeloproliferative disorder such as essential thrombocythemia (ET).    Given the new information, coupled with the patient's past history of multiple strokes, surgeon will need to consider the benefits of surgery against the risks that exist for this patient.    Quentin Mulling, MSN, APRN, FNP-C, CEN [facility]  Perioperative Services Nurse Practitioner Phone: [phone] Fax: [phone] 01/07/24 4:01 PM

## 2024-01-11 ENCOUNTER — Emergency Department: Payer: Medicare HMO

## 2024-01-11 ENCOUNTER — Emergency Department
Admission: EM | Admit: 2024-01-11 | Discharge: 2024-01-12 | Disposition: A | Discharge status: Home / Self Care | Payer: Medicare HMO | Attending: Student in an Organized Health Care Education/Training Program | Admitting: Student in an Organized Health Care Education/Training Program

## 2024-01-11 ENCOUNTER — Other Ambulatory Visit: Payer: Self-pay

## 2024-01-11 ENCOUNTER — Encounter: Payer: Self-pay | Admitting: Emergency Medicine

## 2024-01-11 DIAGNOSIS — D75839 Thrombocytosis, unspecified: Secondary | ICD-10-CM

## 2024-01-11 DIAGNOSIS — N3000 Acute cystitis without hematuria: Secondary | ICD-10-CM

## 2024-01-11 DIAGNOSIS — R5383 Other fatigue: Secondary | ICD-10-CM | POA: Insufficient documentation

## 2024-01-11 DIAGNOSIS — R799 Abnormal finding of blood chemistry, unspecified: Secondary | ICD-10-CM | POA: Diagnosis present

## 2024-01-11 DIAGNOSIS — Z8673 Personal history of transient ischemic attack (TIA), and cerebral infarction without residual deficits: Secondary | ICD-10-CM | POA: Insufficient documentation

## 2024-01-11 DIAGNOSIS — Z7902 Long term (current) use of antithrombotics/antiplatelets: Secondary | ICD-10-CM | POA: Insufficient documentation

## 2024-01-11 DIAGNOSIS — Z20822 Contact with and (suspected) exposure to covid-19: Secondary | ICD-10-CM | POA: Insufficient documentation

## 2024-01-11 DIAGNOSIS — Z96652 Presence of left artificial knee joint: Secondary | ICD-10-CM | POA: Insufficient documentation

## 2024-01-11 DIAGNOSIS — D649 Anemia, unspecified: Secondary | ICD-10-CM | POA: Diagnosis not present

## 2024-01-11 LAB — RESP PANEL BY RT-PCR (RSV, FLU A&B, COVID)  RVPGX2
Influenza A by PCR: NEGATIVE
Influenza B by PCR: NEGATIVE
Resp Syncytial Virus by PCR: NEGATIVE
SARS Coronavirus 2 by RT PCR: NEGATIVE

## 2024-01-11 LAB — CBC
HCT: 27.7 % — ABNORMAL LOW (ref 36.0–46.0)
Hemoglobin: 7.8 g/dL — ABNORMAL LOW (ref 12.0–15.0)
MCH: 19 pg — ABNORMAL LOW (ref 26.0–34.0)
MCHC: 28.2 g/dL — ABNORMAL LOW (ref 30.0–36.0)
MCV: 67.6 fL — ABNORMAL LOW (ref 80.0–100.0)
Platelets: 1127 10*3/uL (ref 150–400)
RBC: 4.1 MIL/uL (ref 3.87–5.11)
RDW: 18.4 % — ABNORMAL HIGH (ref 11.5–15.5)
WBC: 13.8 10*3/uL — ABNORMAL HIGH (ref 4.0–10.5)
nRBC: 0 % (ref 0.0–0.2)

## 2024-01-11 LAB — URINALYSIS, ROUTINE W REFLEX MICROSCOPIC
Glucose, UA: NEGATIVE mg/dL
Hgb urine dipstick: NEGATIVE
Ketones, ur: NEGATIVE mg/dL
Nitrite: POSITIVE — AB
Protein, ur: 30 mg/dL — AB
Specific Gravity, Urine: 1.027 (ref 1.005–1.030)
WBC, UA: >50 WBC/hpf (ref 0–5)
pH: 5 (ref 5.0–8.0)

## 2024-01-11 LAB — COMPREHENSIVE METABOLIC PANEL
ALT: 14 U/L (ref 0–44)
AST: 23 U/L (ref 15–41)
Albumin: 3.7 g/dL (ref 3.5–5.0)
Alkaline Phosphatase: 85 U/L (ref 38–126)
Anion gap: 12 (ref 5–15)
BUN: 13 mg/dL (ref 8–23)
CO2: 24 mmol/L (ref 22–32)
Calcium: 9.6 mg/dL (ref 8.9–10.3)
Chloride: 103 mmol/L (ref 98–111)
Creatinine, Ser: 0.98 mg/dL (ref 0.44–1.00)
GFR, Estimated: 58 mL/min — ABNORMAL LOW (ref >60)
Glucose, Bld: 103 mg/dL — ABNORMAL HIGH (ref 70–99)
Potassium: 4 mmol/L (ref 3.5–5.1)
Sodium: 139 mmol/L (ref 135–145)
Total Bilirubin: 0.4 mg/dL (ref 0.0–1.2)
Total Protein: 6.9 g/dL (ref 6.5–8.1)

## 2024-01-11 LAB — DIFFERENTIAL
Abs Immature Granulocytes: 0.07 10*3/uL (ref 0.00–0.07)
Basophils Absolute: 0.2 10*3/uL — ABNORMAL HIGH (ref 0.0–0.1)
Basophils Relative: 1 %
Eosinophils Absolute: 0.7 10*3/uL — ABNORMAL HIGH (ref 0.0–0.5)
Eosinophils Relative: 5 %
Immature Granulocytes: 1 %
Lymphocytes Relative: 19 %
Lymphs Abs: 2.6 10*3/uL (ref 0.7–4.0)
Monocytes Absolute: 2.1 10*3/uL — ABNORMAL HIGH (ref 0.1–1.0)
Monocytes Relative: 15 %
Neutro Abs: 8.3 10*3/uL — ABNORMAL HIGH (ref 1.7–7.7)
Neutrophils Relative %: 59 %

## 2024-01-11 MED ORDER — CEFUROXIME AXETIL 250 MG PO TABS: 250.0000 mg | ORAL_TABLET | Freq: Two times a day (BID) | ORAL | 0 refills | Status: AC

## 2024-01-11 MED ORDER — SODIUM CHLORIDE 0.9 % IV SOLN
1.0000 g | Freq: Once | INTRAVENOUS | Status: DC
  Filled 2024-01-11: qty 10

## 2024-01-11 MED ORDER — CEFTRIAXONE SODIUM 1 G IJ SOLR
1.0000 g | Freq: Once | INTRAMUSCULAR | Status: AC
  Administered 2024-01-11: 1 g via INTRAMUSCULAR
  Filled 2024-01-11 (×2): qty 10

## 2024-01-11 MED ORDER — LIDOCAINE HCL (PF) 1 % IJ SOLN
INTRAMUSCULAR | Status: AC
  Filled 2024-01-11: qty 5

## 2024-01-11 MED ORDER — ONDANSETRON 4 MG PO TBDP: 4.0000 mg | ORAL_TABLET | Freq: Three times a day (TID) | ORAL | 0 refills | Status: DC | PRN

## 2024-01-11 NOTE — ED Provider Notes (Signed)
[facility] Provider Note    Event Date/Time   First MD Initiated Contact with Patient 01/11/24 2013     (approximate)   History   Abnormal Lab   HPI  [name] is a 83 y.o. female presents to the ER for evaluation of elevated platelets on outpatient labs.  Patient states that she has been feeling tired for quite some time.  Does have a history of CVA and was previously on Plavix.  This was recently held only a few days ago in anticipation of left knee replacement.  She denies any new numbness or tingling denies any chest pain no shortness of breath.  Denies any history of thrombocytosis     Physical Exam   Triage Vital Signs: ED Triage Vitals  Encounter Vitals Group     BP 01/11/24 1451 (!) 110/53     Systolic BP Percentile --      Diastolic BP Percentile --      Pulse Rate 01/11/24 1451 78     Resp 01/11/24 1451 17     Temp 01/11/24 1451 98.1 F (36.7 C)     Temp Source 01/11/24 1451 Oral     SpO2 01/11/24 1451 97 %     Weight 01/11/24 1451 106 lb (48.1 kg)     Height 01/11/24 1451 4\' 11"  (1.499 m)     Head Circumference --      Peak Flow --      Pain Score 01/11/24 1457 0     Pain Loc --      Pain Education --      Exclude from Growth Chart --     Most recent vital signs: Vitals:   01/11/24 1451  BP: (!) 110/53  Pulse: 78  Resp: 17  Temp: 98.1 F (36.7 C)  SpO2: 97%     Constitutional: Alert  Eyes: Conjunctivae are normal.  Head: Atraumatic. Nose: No congestion/rhinnorhea. Mouth/Throat: Mucous membranes are moist.   Neck: Painless ROM.  Cardiovascular:   Good peripheral circulation. Respiratory: Normal respiratory effort.  No retractions.  Gastrointestinal: Soft and nontender.  Musculoskeletal:  no deformity Neurologic:  MAE spontaneously. No gross focal neurologic deficits are appreciated.  Skin:  Skin is warm, dry and intact. No rash noted. Psychiatric: Mood and affect are normal. Speech and behavior are  normal.    ED Results / Procedures / Treatments   Labs (all labs ordered are listed, but only abnormal results are displayed) Labs Reviewed  COMPREHENSIVE METABOLIC PANEL - Abnormal; Notable for the following components:      Result Value   Glucose, Bld 103 (*)    GFR, Estimated 58 (*)    All other components within normal limits  CBC - Abnormal; Notable for the following components:   WBC 13.8 (*)    Hemoglobin 7.8 (*)    HCT 27.7 (*)    MCV 67.6 (*)    MCH 19.0 (*)    MCHC 28.2 (*)    RDW 18.4 (*)    Platelets 1,127 (*)    All other components within normal limits  DIFFERENTIAL - Abnormal; Notable for the following components:   Neutro Abs 8.3 (*)    Monocytes Absolute 2.1 (*)    Eosinophils Absolute 0.7 (*)    Basophils Absolute 0.2 (*)    All other components within normal limits  URINALYSIS, ROUTINE W REFLEX MICROSCOPIC - Abnormal; Notable for the following components:   Color, Urine AMBER (*)    APPearance  CLOUDY (*)    Bilirubin Urine SMALL (*)    Protein, ur 30 (*)    Nitrite POSITIVE (*)    Leukocytes,Ua MODERATE (*)    Bacteria, UA MANY (*)    All other components within normal limits  RESP PANEL BY RT-PCR (RSV, FLU A&B, COVID)  [id number]  POC OCCULT BLOOD, ED  TYPE AND SCREEN     EKG      RADIOLOGY Please see ED Course for my review and interpretation.  I personally reviewed all radiographic images ordered to evaluate for the above acute complaints and reviewed radiology reports and findings.  These findings were personally discussed with the patient.  Please see medical record for radiology report.    PROCEDURES:  Critical Care performed: Yes, see critical care procedure note(s)  Procedures   MEDICATIONS ORDERED IN ED: Medications  cefTRIAXone (ROCEPHIN) 1 g in sodium chloride 0.9 % 100 mL IVPB (has no administration in time range)     IMPRESSION / MDM / ASSESSMENT AND PLAN / ED COURSE  I reviewed the triage vital signs and the nursing  notes.                              Differential diagnosis includes, but is not limited to, autoimmune disease, reactive thrombocytosis, sepsis, malignancy  Patient presenting to the ER for evaluation of symptoms as described above.  Based on symptoms, risk factors and considered above differential, this presenting complaint could reflect a potentially life-threatening illness therefore the patient will be placed on continuous pulse oximetry and telemetry for monitoring.  Laboratory evaluation will be sent to evaluate for the above complaints.  Patient clinically very well-appearing in no acute distress.   Clinical Course as of 01/11/24 2328  Mon Jan 11, 2024  2326 Patient's workup does show evidence of UTI.  Gust the patient's thrombocytosis with Dr. Orlie Dakin of hematology.  Patient is not septic.    She is not septic however no tachycardia no fevers.  She feels well.  Does have thrombocytosis but this very well may be reactive.  Did recommend strongly she be admitted to the hospital for IV antibiotics but patient very reluctant to be admitted.  As her hemodynamics are otherwise stable and she appears well discussed another option of receiving dose of IV antibiotic here in ER and trial of outpatient antibiotics.  Patient and family would prefer this and I do not think is completely unreasonable as they do seem reliable and agree to return if her symptoms were to worsen. [PR]    Clinical Course User Index [PR] Willy Eddy, MD     FINAL CLINICAL IMPRESSION(S) / ED DIAGNOSES   Final diagnoses:  Thrombocytosis, unspecified  Acute cystitis without hematuria     Rx / DC Orders   ED Discharge Orders     None        Note:  This document was prepared using Dragon voice recognition software and may include unintentional dictation errors.    Willy Eddy, MD 01/11/24 2328

## 2024-01-11 NOTE — Discharge Instructions (Addendum)
Please restart your Plavix with the baby aspirin daily.  I have ordered a prescription for antibiotics as well as nausea medication.  If you find that you are unable to keep this antibiotic down.  Please return to the ER for admission for IV antibiotics or discussed with Dr. Judithann Sheen option for IV/IM dosing in clinic.  Hematology clinic will contact you tomorrow morning for close follow-up regarding your elevated platelet levels.

## 2024-01-11 NOTE — ED Triage Notes (Signed)
Pt in via POV, reports having repeat blood work at PCP office due to low H&H x 1 week ago; states PCP office called them and advised to get to ER for further eval.  Patient and spouse unable to recall actually numbers of lab results.  Does endorse some ongoing fatigue and weakness over the last few weeks.  A/Ox4, NAD noted at this time.

## 2024-01-12 ENCOUNTER — Ambulatory Visit: Payer: Medicare HMO | Admitting: Physical Therapy

## 2024-01-12 ENCOUNTER — Ambulatory Visit: Payer: Medicare HMO | Admitting: Occupational Therapy

## 2024-01-12 ENCOUNTER — Other Ambulatory Visit: Payer: Self-pay | Admitting: Oncology

## 2024-01-12 DIAGNOSIS — D75839 Thrombocytosis, unspecified: Secondary | ICD-10-CM

## 2024-01-12 NOTE — ED Notes (Signed)
Provided juice crackers post admin Rocephin pt waits shot time and is deemed appropriate for dc to home with spouse pt remains aox4 speaking in full clear sentences verbalizes understanding and agrees with dc to home

## 2024-01-13 ENCOUNTER — Encounter: Admission: RE | Payer: Self-pay | Source: Home / Self Care

## 2024-01-13 ENCOUNTER — Ambulatory Visit: Admission: RE | Admit: 2024-01-13 | Payer: Medicare HMO | Source: Home / Self Care | Admitting: Orthopedic Surgery

## 2024-01-13 DIAGNOSIS — E876 Hypokalemia: Secondary | ICD-10-CM

## 2024-01-13 DIAGNOSIS — R768 Other specified abnormal immunological findings in serum: Secondary | ICD-10-CM

## 2024-01-13 DIAGNOSIS — N179 Acute kidney failure, unspecified: Secondary | ICD-10-CM

## 2024-01-13 DIAGNOSIS — K920 Hematemesis: Secondary | ICD-10-CM

## 2024-01-13 DIAGNOSIS — D72829 Elevated white blood cell count, unspecified: Secondary | ICD-10-CM

## 2024-01-13 DIAGNOSIS — R5383 Other fatigue: Secondary | ICD-10-CM

## 2024-01-13 DIAGNOSIS — M1712 Unilateral primary osteoarthritis, left knee: Secondary | ICD-10-CM

## 2024-01-13 DIAGNOSIS — D649 Anemia, unspecified: Secondary | ICD-10-CM

## 2024-01-13 DIAGNOSIS — I6501 Occlusion and stenosis of right vertebral artery: Secondary | ICD-10-CM

## 2024-01-13 SURGERY — ARTHROPLASTY, KNEE, TOTAL, USING IMAGELESS COMPUTER-ASSISTED NAVIGATION
Anesthesia: Choice | Site: Knee | Laterality: Left

## 2024-01-14 ENCOUNTER — Inpatient Hospital Stay: Payer: Medicare HMO | Attending: Oncology | Admitting: Oncology

## 2024-01-14 ENCOUNTER — Ambulatory Visit: Payer: Medicare HMO | Admitting: Occupational Therapy

## 2024-01-14 ENCOUNTER — Ambulatory Visit: Payer: Medicare HMO | Admitting: Physical Therapy

## 2024-01-14 ENCOUNTER — Inpatient Hospital Stay: Payer: Medicare HMO

## 2024-01-14 ENCOUNTER — Encounter: Payer: Self-pay | Admitting: Oncology

## 2024-01-14 ENCOUNTER — Other Ambulatory Visit: Payer: Medicare HMO

## 2024-01-14 VITALS — BP 101/53 | HR 76 | Temp 97.3°F | Resp 16 | Ht 59.0 in

## 2024-01-14 DIAGNOSIS — Z803 Family history of malignant neoplasm of breast: Secondary | ICD-10-CM | POA: Insufficient documentation

## 2024-01-14 DIAGNOSIS — M2550 Pain in unspecified joint: Secondary | ICD-10-CM | POA: Diagnosis not present

## 2024-01-14 DIAGNOSIS — D75839 Thrombocytosis, unspecified: Secondary | ICD-10-CM | POA: Diagnosis not present

## 2024-01-14 DIAGNOSIS — D509 Iron deficiency anemia, unspecified: Secondary | ICD-10-CM | POA: Diagnosis not present

## 2024-01-14 DIAGNOSIS — R531 Weakness: Secondary | ICD-10-CM | POA: Diagnosis not present

## 2024-01-14 DIAGNOSIS — Z800 Family history of malignant neoplasm of digestive organs: Secondary | ICD-10-CM | POA: Insufficient documentation

## 2024-01-14 DIAGNOSIS — D75838 Other thrombocytosis: Secondary | ICD-10-CM | POA: Insufficient documentation

## 2024-01-14 DIAGNOSIS — R5383 Other fatigue: Secondary | ICD-10-CM | POA: Insufficient documentation

## 2024-01-14 LAB — CBC (CANCER CENTER ONLY)
HCT: 28.6 % — ABNORMAL LOW (ref 36.0–46.0)
Hemoglobin: 7.7 g/dL — ABNORMAL LOW (ref 12.0–15.0)
MCH: 18.2 pg — ABNORMAL LOW (ref 26.0–34.0)
MCHC: 26.9 g/dL — ABNORMAL LOW (ref 30.0–36.0)
MCV: 67.8 fL — ABNORMAL LOW (ref 80.0–100.0)
Platelet Count: 1044 10*3/uL (ref 150–400)
RBC: 4.22 MIL/uL (ref 3.87–5.11)
RDW: 18.7 % — ABNORMAL HIGH (ref 11.5–15.5)
WBC Count: 12.2 10*3/uL — ABNORMAL HIGH (ref 4.0–10.5)
nRBC: 0 % (ref 0.0–0.2)

## 2024-01-14 LAB — URINE CULTURE: Culture: >=100000 — AB

## 2024-01-14 LAB — VITAMIN B12: Vitamin B-12: 285 pg/mL (ref 180–914)

## 2024-01-14 LAB — IRON AND TIBC
Iron: 23 ug/dL — ABNORMAL LOW (ref 28–170)
Saturation Ratios: 5 % — ABNORMAL LOW (ref 10.4–31.8)
TIBC: 512 ug/dL — ABNORMAL HIGH (ref 250–450)
UIBC: 489 ug/dL

## 2024-01-14 LAB — FOLATE: Folate: >40 ng/mL (ref >5.9)

## 2024-01-14 LAB — FERRITIN: Ferritin: 5 ng/mL — ABNORMAL LOW (ref 11–307)

## 2024-01-14 NOTE — Progress Notes (Signed)
 [facility]  [phone] [phone]  ID: [name] OB: 11/09/1941  MR#: [id number]  RDW#:[id number]  Patient Care Team: Auston Reyes BIRCH, MD as PCP - General (Internal Medicine)  CHIEF COMPLAINT: Thrombocytosis, iron  deficiency anemia.  INTERVAL HISTORY: Patient is an 83 year old female who was noted to have a significantly elevated platelet count on routine blood work.  She is referred for further evaluation.  She currently feels well and is asymptomatic.  She has no neurological plaints.  She denies any recent fevers or illnesses.  She has a good appetite and denies weight loss.  She has no chest pain, shortness of breath, cough, or hemoptysis.  She denies any nausea, vomiting, constipation, or diarrhea.  She has no urinary complaints.  Patient offers no specific complaints today.  REVIEW OF SYSTEMS:   Review of Systems  Constitutional: Negative.  Negative for fever, malaise/fatigue and weight loss.  Respiratory: Negative.  Negative for cough, hemoptysis and shortness of breath.   Cardiovascular: Negative.  Negative for chest pain and leg swelling.  Gastrointestinal: Negative.  Negative for abdominal pain, blood in stool and melena.  Genitourinary: Negative.  Negative for dysuria.  Musculoskeletal: Negative.  Negative for back pain.  Skin: Negative.  Negative for rash.  Neurological: Negative.  Negative for dizziness, focal weakness, weakness and headaches.  Psychiatric/Behavioral: Negative.  The patient is not nervous/anxious.     As per HPI. Otherwise, a complete review of systems is negative.  PAST MEDICAL HISTORY: Past Medical History:  Diagnosis Date   Anemia    Arthritis    Asthma    uses inhaler just prior to surgery to avoid attack   Back pain    from previous injury   Cerebral microvascular disease    Chronic diastolic CHF (congestive heart failure) (HCC)    Complication of anesthesia    has woken  up during 2 different  surgery   Depression    Gallstones    GERD (gastroesophageal reflux disease)    Hiatal hernia    History of kidney stones    HLD (hyperlipidemia)    HTN (hypertension)    Hypothyroidism    Kidney stones    Knee pain    Nausea and vomiting 10/15/2022   Non-diabetic pancreatic hormone dysfunction years   pt. 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   Pancreatitis    Pneumonia, aspiration (HCC)    Seizures (HCC)    caused by dye injected during a procedure   Shortness of breath on exertion    Sinus problem    frequent infections/congestion   Stroke (HCC) 01/24/2020   a.) MRI brain 01/24/2020: small subacute infarct in the RIGHT frontal white matter   Stroke (HCC) 08/19/2022   a.) MRI brain 08/19/2022: acute infarcts in the RIGHT carona radiata and RIGHT anterior thalamus; chronic infarcts in the basal ganglia BILATERALLY   Vertebral artery stenosis, right    a.) MRI 12/03/2022: complete occlusion of the distal V2, V3, and V4 segments    PAST SURGICAL HISTORY: Past Surgical History:  Procedure Laterality Date   ABDOMINAL HYSTERECTOMY     APPENDECTOMY     CARPAL TUNNEL RELEASE  10+ years ago   bilateral   EYE SURGERY  3 yrs ago   bilateral cataracts   FOOT OSTEOTOMY  6 weeks ago   Left foot: great, 2nd & 3rd   FOOT OSTEOTOMY  5 years ago   Right great toe   HAND SURGERY Bilateral 2011-most recent   multiple  hand surgeries, 2 on left, 3 on right   KNEE ARTHROPLASTY Right 04/28/2022   Procedure: COMPUTER ASSISTED TOTAL KNEE ARTHROPLASTY;  Surgeon: Mardee Lynwood SQUIBB, MD;  Location: [facility];  Service: Orthopedics;  Laterality: Right;   LOOP RECORDER INSERTION N/A 05/16/2020   Procedure: LOOP RECORDER INSERTION;  Surgeon: Ammon Blunt, MD;  Location: [facility];  Service: Cardiovascular;  Laterality: N/A;   NASAL SINUS SURGERY  most recent 7-8 yrs ago   7 sinus surgeries    TRIGGER FINGER RELEASE  11/19/2011   Procedure: RELEASE TRIGGER FINGER/A-1 PULLEY;   Surgeon: Arley JONELLE Curia, MD;  Location: [facility];  Service: Orthopedics;  Laterality: Right;  release a-1 pulley right index finger and cyst removal   WRIST GANGLION EXCISION  1980's   right    FAMILY HISTORY: Family History  Problem Relation Age of Onset   Anesthesia problems Father        bad lungs couldn't wake him up   Heart disease Father    Heart attack Father 68   Breast cancer Maternal Grandmother    Breast cancer Maternal Aunt        x 2   Breast cancer Cousin    Pancreatic cancer Cousin    Heart attack Paternal Uncle    Stroke Paternal Grandfather     ADVANCED DIRECTIVES (Y/N):  N  HEALTH MAINTENANCE: Social History   Tobacco Use   Smoking status: Never   Smokeless tobacco: Never  Vaping Use   Vaping status: Never Used  Substance Use Topics   Alcohol  use: Not Currently    Alcohol /week: 0.0 - 1.0 standard drinks of alcohol     Comment: wine once every 2-3 months   Drug use: Never     Colonoscopy:  PAP:  Bone density:  Lipid panel:  Allergies  Allergen Reactions   Cinobac [Cinoxacin] Anaphylaxis    My throat swelled and I stopped breathing.   Latex Dermatitis    IgE = 14 (WNL) on 04/17/2022  Pt is highly reactive to any latex products. Blisters skin. Takes my skin off  Skin will tears off  IgE = 14 (WNL) on 04/17/2022  Pt is highly reactive to any latex products. Blisters skin. Takes my skin off   Other Shortness Of Breath    Muscle relaxers? Pt thinks allergic to muscle relaxers, reports stops breathing but not sure what medicine and if for sure muscle relaxer  Petroleum, Chemicals, Paints, Muscle Relaxers   Petrolatum Distillates [Petroleum Distillate] Shortness Of Breath    Passes out    Sulfa Antibiotics Itching   Meloxicam     Other Reaction(s): Not available, Unknown   Misc. Sulfonamide Containing Compounds Other (See Comments)   Nylon Itching and Rash    Current Outpatient Medications  Medication Sig Dispense  Refill   acetaminophen  (TYLENOL ) 325 MG tablet Take 2 tablets (650 mg total) by mouth every 4 (four) hours as needed for mild pain (or temp > 37.5 C (99.5 F)).     albuterol  (VENTOLIN  HFA) 108 (90 Base) MCG/ACT inhaler Inhale 2 puffs into the lungs every 4 (four) hours as needed for wheezing or shortness of breath. 8 g 0   amLODipine  (NORVASC ) 10 MG tablet Take 1 tablet by mouth daily.     aspirin  EC 81 MG tablet Take 1 tablet (81 mg total) by mouth daily. Swallow whole. 5 tablet 0   cefUROXime  (CEFTIN ) 250 MG tablet Take 1 tablet (250 mg total) by mouth 2 (two)  times daily with a meal for 5 days. 10 tablet 0   clopidogrel  (PLAVIX ) 75 MG tablet Take 1 tablet (75 mg total) by mouth daily. 30 tablet 0   diclofenac  Sodium (VOLTAREN ) 1 % GEL Apply 2 g topically 4 (four) times daily. (Patient taking differently: Apply 2 g topically 4 (four) times daily as needed.) 2 g 0   DULoxetine  (CYMBALTA ) 30 MG capsule Take 1 capsule (30 mg total) by mouth daily. 30 capsule 0   folic acid  (FOLVITE ) 800 MCG tablet Take 1 tablet (800 mcg total) by mouth daily. 30 tablet 0   gabapentin  (NEURONTIN ) 300 MG capsule Take 1 capsule (300 mg total) by mouth 2 (two) times daily. 60 capsule 0   iron  polysaccharides (NIFEREX) 150 MG capsule Take 1 capsule (150 mg total) by mouth daily. 30 capsule 0   KLOR-CON  M10 10 MEQ tablet Take 20 mEq by mouth daily.     levothyroxine  (SYNTHROID ) 50 MCG tablet Take 50 mcg by mouth daily before breakfast.     montelukast  (SINGULAIR ) 10 MG tablet Take 10 mg by mouth at bedtime.     Multiple Vitamins-Minerals (MULTIVITAMIN GUMMIES ADULT) CHEW Chew 2 capsules by mouth daily.     ondansetron  (ZOFRAN -ODT) 4 MG disintegrating tablet Take 1 tablet (4 mg total) by mouth every 8 (eight) hours as needed for nausea or vomiting. 8 tablet 0   pantoprazole  (PROTONIX ) 40 MG tablet TAKE 1 TABLET BY MOUTH EVERY DAY 90 tablet 0   propranolol  ER (INDERAL  LA) 60 MG 24 hr capsule Take 1 capsule (60 mg total) by  mouth daily. 30 capsule 0   atorvastatin  (LIPITOR) 40 MG tablet Take 1 tablet (40 mg total) by mouth daily. (Patient not taking: Reported on 07/31/2023) 30 tablet 0   No current facility-administered medications for this visit.    OBJECTIVE: Vitals:   01/14/24 1332  BP: (!) 101/53  Pulse: 76  Resp: 16  Temp: (!) 97.3 F (36.3 C)  SpO2: 98%     Body mass index is 21.41 kg/m.    ECOG FS:0 - Asymptomatic  General: Well-developed, well-nourished, no acute distress. Eyes: Pink conjunctiva, anicteric sclera. HEENT: Normocephalic, moist mucous membranes. Lungs: No audible wheezing or coughing. Heart: Regular rate and rhythm. Abdomen: Soft, nontender, no obvious distention. Musculoskeletal: No edema, cyanosis, or clubbing. Neuro: Alert, answering all questions appropriately. Cranial nerves grossly intact. Skin: No rashes or petechiae noted. Psych: Normal affect. Lymphatics: No cervical, calvicular, axillary or inguinal LAD.   LAB RESULTS:  Lab Results  Component Value Date   NA 139 01/11/2024   K 4.0 01/11/2024   CL 103 01/11/2024   CO2 24 01/11/2024   GLUCOSE 103 (H) 01/11/2024   BUN 13 01/11/2024   CREATININE 0.98 01/11/2024   CALCIUM  9.6 01/11/2024   PROT 6.9 01/11/2024   ALBUMIN 3.7 01/11/2024   AST 23 01/11/2024   ALT 14 01/11/2024   ALKPHOS 85 01/11/2024   BILITOT 0.4 01/11/2024   GFRNONAA 58 (L) 01/11/2024   GFRAA >60 09/28/2016    Lab Results  Component Value Date   WBC 12.2 (H) 01/14/2024   NEUTROABS 8.3 (H) 01/11/2024   HGB 7.7 (L) 01/14/2024   HCT 28.6 (L) 01/14/2024   MCV 67.8 (L) 01/14/2024   PLT 1,044 (HH) 01/14/2024   Lab Results  Component Value Date   FERRITIN 5 (L) 01/14/2024   Lab Results  Component Value Date   IRON  23 (L) 01/14/2024   TIBC 512 (H) 01/14/2024   IRONPCTSAT 5 (  L) 01/14/2024     STUDIES: DG Chest 2 View Result Date: 01/11/2024 CLINICAL DATA:  Fatigue EXAM: CHEST - 2 VIEW COMPARISON:  11/23/2022 FINDINGS: Normal  cardiomediastinal silhouette with aortic atherosclerosis. Mild reticular and linear interstitial changes at the left base. Nor recording device over the left chest. IMPRESSION: Mild reticular and linear interstitial changes at the left base, likely chronic. No acute airspace disease Electronically Signed   By: Luke Bun M.D.   On: 01/11/2024 22:14    ASSESSMENT: Thrombocytosis, iron  deficiency anemia.  PLAN:    Thrombocytosis: Suspect this is reactive given the patient's severe iron  deficiency anemia.  JAK2 mutation, flow cytometry, BCR-ABL mutation, and IntelliGEN myeloid panel are all drawn for completeness and are pending at time of dictation.  Return to clinic in 1 week for repeat laboratory work and initiation of IV Venofer . Iron  deficiency anemia: Patient's hemoglobin is 7.7 with significantly reduced iron  stores.  Unclear when patient's last colonoscopy was.  Return to clinic in 1 week for IV Venofer  as above.  I spent a total of 45 minutes reviewing chart data, face-to-face evaluation with the patient, counseling and coordination of care as detailed above.   Patient expressed understanding and was in agreement with this plan. She also understands that She can call clinic at any time with any questions, concerns, or complaints.    Evalene JINNY Reusing, MD   01/14/2024 3:47 PM

## 2024-01-15 NOTE — Progress Notes (Signed)
 ED Antimicrobial Stewardship Positive Culture Follow Up   [name] is an 83 y.o. female who presented to [facility] on 01/11/2024 with a chief complaint of  Chief Complaint  Patient presents with   Abnormal Lab    Recent Results (from the past 720 hours)  Surgical pcr screen     Status: None   Collection Time: 01/07/24  2:38 PM   Specimen: Nasal Mucosa; Nasal Swab  Result Value Ref Range Status   MRSA, PCR NEGATIVE NEGATIVE Final   Staphylococcus aureus NEGATIVE NEGATIVE Final    Comment: (NOTE) The Xpert SA Assay (FDA approved for NASAL specimens in patients 29 years of age and older), is one component of a comprehensive surveillance program. It is not intended to diagnose infection nor to guide or monitor treatment. Performed at [facility], [address] [place], [place] [address]   Resp panel by RT-PCR (RSV, Flu A&B, Covid) Urine, Clean Catch     Status: None   Collection Time: 01/11/24  9:59 PM   Specimen: Urine, Clean Catch; Nasal Swab  Result Value Ref Range Status   SARS Coronavirus 2 by RT PCR NEGATIVE NEGATIVE Final    Comment: (NOTE) SARS-CoV-2 target nucleic acids are NOT DETECTED.  The SARS-CoV-2 RNA is generally detectable in upper respiratory specimens during the acute phase of infection. The lowest concentration of SARS-CoV-2 viral copies this assay can detect is 138 copies/mL. A negative result does not preclude SARS-Cov-2 infection and should not be used as the sole basis for treatment or other patient management decisions. A negative result may occur with  improper specimen collection/handling, submission of specimen other than nasopharyngeal swab, presence of viral mutation(s) within the areas targeted by this assay, and inadequate number of viral copies(<138 copies/mL). A negative result must be combined with clinical observations, patient history, and epidemiological information. The expected result is Negative.  Fact Sheet for Patients:   bloggercourse.com  Fact Sheet for Healthcare Providers:  seriousbroker.it  This test is no t yet approved or cleared by the [place]  FDA and  has been authorized for detection and/or diagnosis of SARS-CoV-2 by FDA under an Emergency Use Authorization (EUA). This EUA will remain  in effect (meaning this test can be used) for the duration of the COVID-19 declaration under Section 564(b)(1) of the Act, 21 U.S.C.section 360bbb-3(b)(1), unless the authorization is terminated  or revoked sooner.       Influenza A by PCR NEGATIVE NEGATIVE Final   Influenza B by PCR NEGATIVE NEGATIVE Final    Comment: (NOTE) The Xpert Xpress SARS-CoV-2/FLU/RSV plus assay is intended as an aid in the diagnosis of influenza from Nasopharyngeal swab specimens and should not be used as a sole basis for treatment. Nasal washings and aspirates are unacceptable for Xpert Xpress SARS-CoV-2/FLU/RSV testing.  Fact Sheet for Patients: bloggercourse.com  Fact Sheet for Healthcare Providers: seriousbroker.it  This test is not yet approved or cleared by the [place]  FDA and has been authorized for detection and/or diagnosis of SARS-CoV-2 by FDA under an Emergency Use Authorization (EUA). This EUA will remain in effect (meaning this test can be used) for the duration of the COVID-19 declaration under Section 564(b)(1) of the Act, 21 U.S.C. section 360bbb-3(b)(1), unless the authorization is terminated or revoked.     Resp Syncytial Virus by PCR NEGATIVE NEGATIVE Final    Comment: (NOTE) Fact Sheet for Patients: bloggercourse.com  Fact Sheet for Healthcare Providers: seriousbroker.it  This test is not yet approved or cleared by  the [place]  FDA and has been authorized for detection and/or diagnosis of SARS-CoV-2 by FDA under an Emergency Use  Authorization (EUA). This EUA will remain in effect (meaning this test can be used) for the duration of the COVID-19 declaration under Section 564(b)(1) of the Act, 21 U.S.C. section 360bbb-3(b)(1), unless the authorization is terminated or revoked.  Performed at [facility], [address] [place], [place] [address]   Urine Culture     Status: Abnormal   Collection Time: 01/11/24  9:59 PM   Specimen: Urine, Random  Result Value Ref Range Status   Specimen Description   Final    URINE, RANDOM Performed at [facility], [address] [place], [place] [address]    Special Requests   Final    NONE Performed at [facility], [address] [place], [place] [address]    Culture >=100,000 COLONIES/mL KLEBSIELLA AEROGENES (A)  Final   Report Status 01/14/2024 FINAL  Final   Organism ID, Bacteria KLEBSIELLA AEROGENES (A)  Final      Susceptibility   Klebsiella aerogenes - MIC*    CEFEPIME <=0.12 SENSITIVE Sensitive     CEFTRIAXONE  1 SENSITIVE Sensitive     CIPROFLOXACIN <=0.25 SENSITIVE Sensitive     GENTAMICIN <=1 SENSITIVE Sensitive     IMIPENEM 0.5 SENSITIVE Sensitive     NITROFURANTOIN 128 RESISTANT Resistant     TRIMETH/SULFA <=20 SENSITIVE Sensitive     PIP/TAZO 8 SENSITIVE Sensitive ug/mL    * >=100,000 COLONIES/mL KLEBSIELLA AEROGENES    [x]  Treated with cefuroxime , organism resistant to prescribed antimicrobial []  Patient discharged originally without antimicrobial agent and treatment is now indicated  New antibiotic prescription: N/A  ED Provider: Dr. Jossie  Comments: Spoke with patient via telephone on 01/15/24 at about 1230. Discussed results and possibility that cefuroxime  may not adequately treat identified organism (AmpC producer). No good oral options due to cinoxacin (anaphylaxis) and sulfa (itching) allergies. Patient denies any dysuria, frequent urination, flank pain, fevers. She endorses being in her normal state or health at  this time. She also denies having any urinary symptoms at ED visit. Return precautions discussed with patient. Patient acknowledged understanding of information and return precautions.  [name] 01/15/2024, 12:39 PM

## 2024-01-18 LAB — COMP PANEL: LEUKEMIA/LYMPHOMA

## 2024-01-19 ENCOUNTER — Ambulatory Visit: Payer: Medicare HMO | Admitting: Physical Therapy

## 2024-01-19 ENCOUNTER — Ambulatory Visit: Payer: Medicare HMO | Admitting: Occupational Therapy

## 2024-01-21 ENCOUNTER — Ambulatory Visit: Payer: Medicare HMO | Admitting: Occupational Therapy

## 2024-01-21 ENCOUNTER — Ambulatory Visit: Payer: Medicare HMO | Admitting: Physical Therapy

## 2024-01-21 LAB — JAK2 V617F RFX CALR/MPL/E12-15

## 2024-01-21 LAB — CALR +MPL + E12-E15  (REFLEX)

## 2024-01-21 LAB — BCR-ABL1, CML/ALL, PCR, QUANT
E1A2 Transcript: <0.0032 %
Interpretation (BCRAL):: NEGATIVE
b2a2 transcript: <0.0032 %
b3a2 transcript: <0.0032 %

## 2024-01-22 ENCOUNTER — Other Ambulatory Visit: Payer: Self-pay | Admitting: *Deleted

## 2024-01-22 ENCOUNTER — Inpatient Hospital Stay: Payer: Medicare HMO

## 2024-01-22 ENCOUNTER — Inpatient Hospital Stay: Payer: Medicare HMO | Admitting: Oncology

## 2024-01-22 ENCOUNTER — Other Ambulatory Visit: Payer: Self-pay | Admitting: Oncology

## 2024-01-22 ENCOUNTER — Encounter: Payer: Self-pay | Admitting: Oncology

## 2024-01-22 VITALS — BP 113/50 | HR 79 | Temp 97.4°F | Resp 16 | Ht 59.0 in | Wt 107.5 lb

## 2024-01-22 VITALS — BP 108/46 | HR 78 | Temp 97.5°F | Resp 17

## 2024-01-22 DIAGNOSIS — M2550 Pain in unspecified joint: Secondary | ICD-10-CM | POA: Diagnosis not present

## 2024-01-22 DIAGNOSIS — Z800 Family history of malignant neoplasm of digestive organs: Secondary | ICD-10-CM | POA: Diagnosis not present

## 2024-01-22 DIAGNOSIS — D75838 Other thrombocytosis: Secondary | ICD-10-CM | POA: Diagnosis not present

## 2024-01-22 DIAGNOSIS — D509 Iron deficiency anemia, unspecified: Secondary | ICD-10-CM

## 2024-01-22 DIAGNOSIS — R531 Weakness: Secondary | ICD-10-CM | POA: Diagnosis not present

## 2024-01-22 DIAGNOSIS — D75839 Thrombocytosis, unspecified: Secondary | ICD-10-CM

## 2024-01-22 DIAGNOSIS — R5383 Other fatigue: Secondary | ICD-10-CM | POA: Diagnosis not present

## 2024-01-22 DIAGNOSIS — Z803 Family history of malignant neoplasm of breast: Secondary | ICD-10-CM | POA: Diagnosis not present

## 2024-01-22 LAB — CBC WITH DIFFERENTIAL/PLATELET
Abs Immature Granulocytes: 0.08 10*3/uL — ABNORMAL HIGH (ref 0.00–0.07)
Basophils Absolute: 0.1 10*3/uL (ref 0.0–0.1)
Basophils Relative: 1 %
Eosinophils Absolute: 0.9 10*3/uL — ABNORMAL HIGH (ref 0.0–0.5)
Eosinophils Relative: 7 %
HCT: 26 % — ABNORMAL LOW (ref 36.0–46.0)
Hemoglobin: 6.9 g/dL — CL (ref 12.0–15.0)
Immature Granulocytes: 1 %
Lymphocytes Relative: 14 %
Lymphs Abs: 1.7 10*3/uL (ref 0.7–4.0)
MCH: 18 pg — ABNORMAL LOW (ref 26.0–34.0)
MCHC: 26.5 g/dL — ABNORMAL LOW (ref 30.0–36.0)
MCV: 67.7 fL — ABNORMAL LOW (ref 80.0–100.0)
Monocytes Absolute: 1.7 10*3/uL — ABNORMAL HIGH (ref 0.1–1.0)
Monocytes Relative: 14 %
Neutro Abs: 7.6 10*3/uL (ref 1.7–7.7)
Neutrophils Relative %: 63 %
Platelets: 891 10*3/uL — ABNORMAL HIGH (ref 150–400)
RBC: 3.84 MIL/uL — ABNORMAL LOW (ref 3.87–5.11)
RDW: 19 % — ABNORMAL HIGH (ref 11.5–15.5)
WBC: 12.2 10*3/uL — ABNORMAL HIGH (ref 4.0–10.5)
nRBC: 0 % (ref 0.0–0.2)

## 2024-01-22 LAB — SAMPLE TO BLOOD BANK

## 2024-01-22 LAB — PREPARE RBC (CROSSMATCH)

## 2024-01-22 MED ORDER — IRON SUCROSE 20 MG/ML IV SOLN
200.0000 mg | Freq: Once | INTRAVENOUS | Status: AC
  Administered 2024-01-22: 200 mg via INTRAVENOUS

## 2024-01-22 MED ORDER — SODIUM CHLORIDE 0.9% FLUSH
10.0000 mL | Freq: Once | INTRAVENOUS | Status: AC | PRN
  Administered 2024-01-22: 10 mL
  Filled 2024-01-22: qty 10

## 2024-01-22 NOTE — Progress Notes (Signed)
[facility]  Telephone:([phone] Fax:(336) [phone]  ID: [name] OB: [birth year]  MR#: [id number]  AOZ#:[id number]  Patient Care Team: Marguarite Arbour, MD as PCP - General (Internal Medicine)  CHIEF COMPLAINT: Thrombocytosis, iron deficiency anemia.  INTERVAL HISTORY: Patient returns to clinic today for further evaluation, discussion of her laboratory work, and initiation of IV iron.  She continues to feel well and remains asymptomatic.  She does not complain of any weakness or fatigue today.  She has no neurologic complaints.  She denies any recent fevers or illnesses.  She has a good appetite and denies weight loss.  She has no chest pain, shortness of breath, cough, or hemoptysis.  She denies any nausea, vomiting, constipation, or diarrhea.  She has no melena or hematochezia.  She has no urinary complaints.  Patient offers no specific complaints today.  REVIEW OF SYSTEMS:   Review of Systems  Constitutional: Negative.  Negative for fever, malaise/fatigue and weight loss.  Respiratory: Negative.  Negative for cough, hemoptysis and shortness of breath.   Cardiovascular: Negative.  Negative for chest pain and leg swelling.  Gastrointestinal: Negative.  Negative for abdominal pain, blood in stool and melena.  Genitourinary: Negative.  Negative for dysuria.  Musculoskeletal: Negative.  Negative for back pain.  Skin: Negative.  Negative for rash.  Neurological: Negative.  Negative for dizziness, focal weakness, weakness and headaches.  Psychiatric/Behavioral: Negative.  The patient is not nervous/anxious.     As per HPI. Otherwise, a complete review of systems is negative.  PAST MEDICAL HISTORY: Past Medical History:  Diagnosis Date   Anemia    Arthritis    Asthma    uses inhaler just prior to surgery to avoid attack   Back pain    from previous injury   Cerebral microvascular disease    Chronic diastolic CHF (congestive heart failure) (HCC)     Complication of anesthesia    has woken  up during 2 different surgery   Depression    Gallstones    GERD (gastroesophageal reflux disease)    Hiatal hernia    History of kidney stones    HLD (hyperlipidemia)    HTN (hypertension)    Hypothyroidism    Kidney stones    Knee pain    Nausea and vomiting 10/15/2022   Non-diabetic pancreatic hormone dysfunction years   pt. states pancreas does not function properly   Pancreatitis    Pneumonia, aspiration (HCC)    Seizures (HCC)    caused by dye injected during a procedure   Shortness of breath on exertion    Sinus problem    frequent infections/congestion   Stroke (HCC) 01/24/2020   a.) MRI brain 01/24/2020: small subacute infarct in the RIGHT frontal white matter   Stroke (HCC) 08/19/2022   a.) MRI brain 08/19/2022: acute infarcts in the RIGHT carona radiata and RIGHT anterior thalamus; chronic infarcts in the basal ganglia BILATERALLY   Vertebral artery stenosis, right    a.) MRI 12/03/2022: complete occlusion of the distal V2, V3, and V4 segments    PAST SURGICAL HISTORY: Past Surgical History:  Procedure Laterality Date   ABDOMINAL HYSTERECTOMY     APPENDECTOMY     CARPAL TUNNEL RELEASE  10+ years ago   bilateral   EYE SURGERY  3 yrs ago   bilateral cataracts   FOOT OSTEOTOMY  6 weeks ago   Left foot: great, 2nd & 3rd   FOOT OSTEOTOMY  5 years ago   Right great  toe   HAND SURGERY Bilateral 2011-most recent   multiple hand surgeries, 2 on left, 3 on right   KNEE ARTHROPLASTY Right 04/28/2022   Procedure: COMPUTER ASSISTED TOTAL KNEE ARTHROPLASTY;  Surgeon: Donato Heinz, MD;  Location: [facility];  Service: Orthopedics;  Laterality: Right;   LOOP RECORDER INSERTION N/A 05/16/2020   Procedure: LOOP RECORDER INSERTION;  Surgeon: Marcina Millard, MD;  Location: [facility];  Service: Cardiovascular;  Laterality: N/A;   NASAL SINUS SURGERY  most recent 7-8 yrs ago   7 sinus surgeries    TRIGGER FINGER RELEASE   11/19/2011   Procedure: RELEASE TRIGGER FINGER/A-1 PULLEY;  Surgeon: Nicki Reaper, MD;  Location: [facility];  Service: Orthopedics;  Laterality: Right;  release a-1 pulley right index finger and cyst removal   WRIST GANGLION EXCISION  1980's   right    FAMILY HISTORY: Family History  Problem Relation Age of Onset   Anesthesia problems Father        "bad lungs" couldn't wake him up   Heart disease Father    Heart attack Father 7   Breast cancer Maternal Grandmother    Breast cancer Maternal Aunt        x 2   Breast cancer Cousin    Pancreatic cancer Cousin    Heart attack Paternal Uncle    Stroke Paternal Grandfather     ADVANCED DIRECTIVES (Y/N):  N  HEALTH MAINTENANCE: Social History   Tobacco Use   Smoking status: Never   Smokeless tobacco: Never  Vaping Use   Vaping status: Never Used  Substance Use Topics   Alcohol use: Not Currently    Alcohol/week: 0.0 - 1.0 standard drinks of alcohol    Comment: wine once every 2-3 months   Drug use: Never     Colonoscopy:  PAP:  Bone density:  Lipid panel:  Allergies  Allergen Reactions   Cinobac [Cinoxacin] Anaphylaxis    "My throat swelled and I stopped breathing."   Latex Dermatitis    IgE = 14 (WNL) on 04/17/2022  Pt is highly reactive to any latex products. Blisters skin. "Takes my skin off"  "Skin will tears off"  IgE = 14 (WNL) on 04/17/2022  Pt is highly reactive to any latex products. Blisters skin. "Takes my skin off"   Other Shortness Of Breath    Muscle relaxers? Pt thinks allergic to muscle relaxers, reports stops breathing but not sure what medicine and if for sure muscle relaxer  Petroleum, Chemicals, Paints, Muscle Relaxers   Petrolatum Distillates [Petroleum Distillate] Shortness Of Breath    Passes out    Sulfa Antibiotics Itching   Meloxicam     Other Reaction(s): Not available, Unknown   Misc. Sulfonamide Containing Compounds Other (See Comments)   Nylon Itching and Rash     Current Outpatient Medications  Medication Sig Dispense Refill   acetaminophen (TYLENOL) 325 MG tablet Take 2 tablets (650 mg total) by mouth every 4 (four) hours as needed for mild pain (or temp > 37.5 C (99.5 F)).     albuterol (VENTOLIN HFA) 108 (90 Base) MCG/ACT inhaler Inhale 2 puffs into the lungs every 4 (four) hours as needed for wheezing or shortness of breath. 8 g 0   amLODipine (NORVASC) 10 MG tablet Take 1 tablet by mouth daily.     aspirin EC 81 MG tablet Take 1 tablet (81 mg total) by mouth daily. Swallow whole. 5 tablet 0   clopidogrel (PLAVIX) 75 MG  tablet Take 1 tablet (75 mg total) by mouth daily. 30 tablet 0   diclofenac Sodium (VOLTAREN) 1 % GEL Apply 2 g topically 4 (four) times daily. (Patient taking differently: Apply 2 g topically 4 (four) times daily as needed.) 2 g 0   DULoxetine (CYMBALTA) 30 MG capsule Take 1 capsule (30 mg total) by mouth daily. 30 capsule 0   folic acid (FOLVITE) 800 MCG tablet Take 1 tablet (800 mcg total) by mouth daily. 30 tablet 0   gabapentin (NEURONTIN) 300 MG capsule Take 1 capsule (300 mg total) by mouth 2 (two) times daily. 60 capsule 0   KLOR-CON M10 10 MEQ tablet Take 20 mEq by mouth daily.     levothyroxine (SYNTHROID) 50 MCG tablet Take 50 mcg by mouth daily before breakfast.     Multiple Vitamins-Minerals (MULTIVITAMIN GUMMIES ADULT) CHEW Chew 2 capsules by mouth daily.     ondansetron (ZOFRAN-ODT) 4 MG disintegrating tablet Take 1 tablet (4 mg total) by mouth every 8 (eight) hours as needed for nausea or vomiting. 8 tablet 0   pantoprazole (PROTONIX) 40 MG tablet TAKE 1 TABLET BY MOUTH EVERY DAY 90 tablet 0   propranolol ER (INDERAL LA) 60 MG 24 hr capsule Take 1 capsule (60 mg total) by mouth daily. 30 capsule 0   atorvastatin (LIPITOR) 40 MG tablet Take 1 tablet (40 mg total) by mouth daily. (Patient not taking: Reported on 07/31/2023) 30 tablet 0   iron polysaccharides (NIFEREX) 150 MG capsule Take 1 capsule (150 mg total) by  mouth daily. 30 capsule 0   montelukast (SINGULAIR) 10 MG tablet Take 10 mg by mouth at bedtime.     No current facility-administered medications for this visit.    OBJECTIVE: Vitals:   01/22/24 1037  BP: (!) 113/50  Pulse: 79  Resp: 16  Temp: (!) 97.4 F (36.3 C)  SpO2: 100%     Body mass index is 21.71 kg/m.    ECOG FS:0 - Asymptomatic  General: Well-developed, well-nourished, no acute distress.  Sitting in a wheelchair. Eyes: Pink conjunctiva, anicteric sclera. HEENT: Normocephalic, moist mucous membranes. Lungs: No audible wheezing or coughing. Heart: Regular rate and rhythm. Abdomen: Soft, nontender, no obvious distention. Musculoskeletal: No edema, cyanosis, or clubbing. Neuro: Alert, answering all questions appropriately. Cranial nerves grossly intact. Skin: No rashes or petechiae noted. Psych: Normal affect.  LAB RESULTS:  Lab Results  Component Value Date   NA 139 01/11/2024   K 4.0 01/11/2024   CL 103 01/11/2024   CO2 24 01/11/2024   GLUCOSE 103 (H) 01/11/2024   BUN 13 01/11/2024   CREATININE 0.98 01/11/2024   CALCIUM 9.6 01/11/2024   PROT 6.9 01/11/2024   ALBUMIN 3.7 01/11/2024   AST 23 01/11/2024   ALT 14 01/11/2024   ALKPHOS 85 01/11/2024   BILITOT 0.4 01/11/2024   GFRNONAA 58 (L) 01/11/2024   GFRAA >60 09/28/2016    Lab Results  Component Value Date   WBC 12.2 (H) 01/22/2024   NEUTROABS 7.6 01/22/2024   HGB 6.9 (LL) 01/22/2024   HCT 26.0 (L) 01/22/2024   MCV 67.7 (L) 01/22/2024   PLT 891 (H) 01/22/2024   Lab Results  Component Value Date   FERRITIN 5 (L) 01/14/2024   Lab Results  Component Value Date   IRON 23 (L) 01/14/2024   TIBC 512 (H) 01/14/2024   IRONPCTSAT 5 (L) 01/14/2024     STUDIES: DG Chest 2 View Result Date: 01/11/2024 CLINICAL DATA:  Fatigue EXAM: CHEST - 2  VIEW COMPARISON:  11/23/2022 FINDINGS: Normal cardiomediastinal silhouette with aortic atherosclerosis. Mild reticular and linear interstitial changes at the left  base. Nor recording device over the left chest. IMPRESSION: Mild reticular and linear interstitial changes at the left base, likely chronic. No acute airspace disease Electronically Signed   By: Jasmine Pang M.D.   On: 01/11/2024 22:14    ASSESSMENT: Thrombocytosis, iron deficiency anemia.  PLAN:    Iron deficiency anemia: Possibly secondary to poor absorption or decreased dietary intake.  Patient's hemoglobin and iron stores are significantly reduced.  Proceed with 200 mg IV Venofer today.  Patient will also return to clinic on Monday for 1 unit of packed red blood cells.  She would then return to clinic in approximately 2 weeks for repeat laboratory work, further evaluation, and continuation of IV iron. Thrombocytosis: Likely reactive secondary to iron deficiency anemia.  All of her other blood work including JAK2 mutation with reflex, flow cytometry, BCR-ABL mutation, and IntelliGEN myeloid panel are all either negative or within normal limits.  I spent a total of 30 minutes reviewing chart data, face-to-face evaluation with the patient, counseling and coordination of care as detailed above.   Patient expressed understanding and was in agreement with this plan. She also understands that She can call clinic at any time with any questions, concerns, or complaints.    Jeralyn Ruths, MD   01/22/2024 12:56 PM

## 2024-01-25 ENCOUNTER — Inpatient Hospital Stay: Payer: Medicare HMO

## 2024-01-25 DIAGNOSIS — M2550 Pain in unspecified joint: Secondary | ICD-10-CM | POA: Diagnosis not present

## 2024-01-25 DIAGNOSIS — Z803 Family history of malignant neoplasm of breast: Secondary | ICD-10-CM | POA: Diagnosis not present

## 2024-01-25 DIAGNOSIS — D509 Iron deficiency anemia, unspecified: Secondary | ICD-10-CM | POA: Diagnosis not present

## 2024-01-25 DIAGNOSIS — Z800 Family history of malignant neoplasm of digestive organs: Secondary | ICD-10-CM | POA: Diagnosis not present

## 2024-01-25 DIAGNOSIS — R5383 Other fatigue: Secondary | ICD-10-CM | POA: Diagnosis not present

## 2024-01-25 DIAGNOSIS — D75838 Other thrombocytosis: Secondary | ICD-10-CM | POA: Diagnosis not present

## 2024-01-25 DIAGNOSIS — R531 Weakness: Secondary | ICD-10-CM | POA: Diagnosis not present

## 2024-01-25 MED ORDER — DIPHENHYDRAMINE HCL 50 MG/ML IJ SOLN
25.0000 mg | Freq: Once | INTRAMUSCULAR | Status: AC
  Administered 2024-01-25: 25 mg via INTRAVENOUS
  Filled 2024-01-25: qty 1

## 2024-01-25 MED ORDER — SODIUM CHLORIDE 0.9% IV SOLUTION
250.0000 mL | INTRAVENOUS | Status: AC
  Administered 2024-01-25: 100 mL via INTRAVENOUS
  Filled 2024-01-25 (×2): qty 250

## 2024-01-25 MED ORDER — ACETAMINOPHEN 325 MG PO TABS
650.0000 mg | ORAL_TABLET | Freq: Once | ORAL | Status: AC
  Administered 2024-01-25: 650 mg via ORAL
  Filled 2024-01-25: qty 2

## 2024-01-25 NOTE — Patient Instructions (Signed)

## 2024-01-26 ENCOUNTER — Ambulatory Visit: Payer: Medicare HMO | Admitting: Physical Therapy

## 2024-01-26 ENCOUNTER — Ambulatory Visit: Payer: Medicare HMO | Admitting: Occupational Therapy

## 2024-01-26 LAB — TYPE AND SCREEN
ABO/RH(D): O POS
Antibody Screen: NEGATIVE
Unit division: 0

## 2024-01-26 LAB — BPAM RBC
Blood Product Expiration Date: 202503122359
ISSUE DATE / TIME: 202502170927
Unit Type and Rh: 5100

## 2024-01-28 ENCOUNTER — Ambulatory Visit: Payer: Medicare HMO | Admitting: Occupational Therapy

## 2024-01-28 ENCOUNTER — Ambulatory Visit: Payer: Medicare HMO | Admitting: Physical Therapy

## 2024-02-02 ENCOUNTER — Ambulatory Visit: Payer: Medicare HMO | Admitting: Occupational Therapy

## 2024-02-02 ENCOUNTER — Ambulatory Visit: Payer: Medicare HMO | Admitting: Physical Therapy

## 2024-02-03 ENCOUNTER — Inpatient Hospital Stay: Payer: Medicare HMO

## 2024-02-03 ENCOUNTER — Inpatient Hospital Stay: Payer: Medicare HMO | Admitting: Oncology

## 2024-02-03 ENCOUNTER — Encounter: Payer: Self-pay | Admitting: Oncology

## 2024-02-03 VITALS — BP 114/56 | HR 74 | Temp 97.7°F | Resp 16 | Ht 59.0 in | Wt 104.6 lb

## 2024-02-03 VITALS — BP 123/62 | HR 73 | Resp 16

## 2024-02-03 DIAGNOSIS — D509 Iron deficiency anemia, unspecified: Secondary | ICD-10-CM

## 2024-02-03 DIAGNOSIS — R531 Weakness: Secondary | ICD-10-CM | POA: Diagnosis not present

## 2024-02-03 DIAGNOSIS — D75838 Other thrombocytosis: Secondary | ICD-10-CM | POA: Diagnosis not present

## 2024-02-03 DIAGNOSIS — Z800 Family history of malignant neoplasm of digestive organs: Secondary | ICD-10-CM | POA: Diagnosis not present

## 2024-02-03 DIAGNOSIS — Z803 Family history of malignant neoplasm of breast: Secondary | ICD-10-CM | POA: Diagnosis not present

## 2024-02-03 DIAGNOSIS — M2550 Pain in unspecified joint: Secondary | ICD-10-CM | POA: Diagnosis not present

## 2024-02-03 DIAGNOSIS — R5383 Other fatigue: Secondary | ICD-10-CM | POA: Diagnosis not present

## 2024-02-03 LAB — CBC WITH DIFFERENTIAL/PLATELET
Abs Immature Granulocytes: 0.05 10*3/uL (ref 0.00–0.07)
Basophils Absolute: 0.1 10*3/uL (ref 0.0–0.1)
Basophils Relative: 1 %
Eosinophils Absolute: 1 10*3/uL — ABNORMAL HIGH (ref 0.0–0.5)
Eosinophils Relative: 8 %
HCT: 40.1 % (ref 36.0–46.0)
Hemoglobin: 11.3 g/dL — ABNORMAL LOW (ref 12.0–15.0)
Immature Granulocytes: 0 %
Lymphocytes Relative: 13 %
Lymphs Abs: 1.6 10*3/uL (ref 0.7–4.0)
MCH: 21 pg — ABNORMAL LOW (ref 26.0–34.0)
MCHC: 28.2 g/dL — ABNORMAL LOW (ref 30.0–36.0)
MCV: 74.5 fL — ABNORMAL LOW (ref 80.0–100.0)
Monocytes Absolute: 1.3 10*3/uL — ABNORMAL HIGH (ref 0.1–1.0)
Monocytes Relative: 11 %
Neutro Abs: 7.9 10*3/uL — ABNORMAL HIGH (ref 1.7–7.7)
Neutrophils Relative %: 67 %
Platelets: 698 10*3/uL — ABNORMAL HIGH (ref 150–400)
RBC: 5.38 MIL/uL — ABNORMAL HIGH (ref 3.87–5.11)
RDW: 25.6 % — ABNORMAL HIGH (ref 11.5–15.5)
WBC: 11.9 10*3/uL — ABNORMAL HIGH (ref 4.0–10.5)
nRBC: 0 % (ref 0.0–0.2)

## 2024-02-03 LAB — SAMPLE TO BLOOD BANK

## 2024-02-03 MED ORDER — IRON SUCROSE 20 MG/ML IV SOLN
200.0000 mg | Freq: Once | INTRAVENOUS | Status: AC
  Administered 2024-02-03: 200 mg via INTRAVENOUS
  Filled 2024-02-03: qty 10

## 2024-02-03 NOTE — Progress Notes (Signed)
 [facility]  Telephone:(336) [phone] Fax:(336) [phone]  ID: [name] OB: [birth year]  MR#: [id number]  AOZ#:[id number]  Patient Care Team: Marguarite Arbour, MD as PCP - General (Internal Medicine)  CHIEF COMPLAINT: Thrombocytosis, iron deficiency anemia.  INTERVAL HISTORY: Patient returns to clinic today for repeat return to work, further evaluation, and consideration of additional IV iron.  She continues to have weakness and fatigue, but admits this has improved.  She also has an improved appetite and more energy.  She has no neurologic complaints.  She denies any recent fevers or illnesses.   She has no chest pain, shortness of breath, cough, or hemoptysis.  She denies any nausea, vomiting, constipation, or diarrhea.  She has no melena or hematochezia.  She has no urinary complaints.  Patient offers no further specific complaints today.  REVIEW OF SYSTEMS:   Review of Systems  Constitutional:  Positive for malaise/fatigue. Negative for fever and weight loss.  Respiratory: Negative.  Negative for cough, hemoptysis and shortness of breath.   Cardiovascular: Negative.  Negative for chest pain and leg swelling.  Gastrointestinal: Negative.  Negative for abdominal pain, blood in stool and melena.  Genitourinary: Negative.  Negative for dysuria.  Musculoskeletal:  Positive for joint pain. Negative for back pain.  Skin: Negative.  Negative for rash.  Neurological: Negative.  Negative for dizziness, focal weakness, weakness and headaches.  Psychiatric/Behavioral: Negative.  The patient is not nervous/anxious.     As per HPI. Otherwise, a complete review of systems is negative.  PAST MEDICAL HISTORY: Past Medical History:  Diagnosis Date   Anemia    Arthritis    Asthma    uses inhaler just prior to surgery to avoid attack   Back pain    from previous injury   Cerebral microvascular disease    Chronic diastolic CHF (congestive heart failure) (HCC)    Complication  of anesthesia    has woken  up during 2 different surgery   Depression    Gallstones    GERD (gastroesophageal reflux disease)    Hiatal hernia    History of kidney stones    HLD (hyperlipidemia)    HTN (hypertension)    Hypothyroidism    Kidney stones    Knee pain    Nausea and vomiting 10/15/2022   Non-diabetic pancreatic hormone dysfunction years   pt. 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 function properly   Pancreatitis    Pneumonia, aspiration (HCC)    Seizures (HCC)    caused by dye injected during a procedure   Shortness of breath on exertion    Sinus problem    frequent infections/congestion   Stroke (HCC) 01/24/2020   a.) MRI brain 01/24/2020: small subacute infarct in the RIGHT frontal white matter   Stroke (HCC) 08/19/2022   a.) MRI brain 08/19/2022: acute infarcts in the RIGHT carona radiata and RIGHT anterior thalamus; chronic infarcts in the basal ganglia BILATERALLY   Vertebral artery stenosis, right    a.) MRI 12/03/2022: complete occlusion of the distal V2, V3, and V4 segments    PAST SURGICAL HISTORY: Past Surgical History:  Procedure Laterality Date   ABDOMINAL HYSTERECTOMY     APPENDECTOMY     CARPAL TUNNEL RELEASE  10+ years ago   bilateral   EYE SURGERY  3 yrs ago   bilateral cataracts   FOOT OSTEOTOMY  6 weeks ago   Left foot: great, 2nd & 3rd   FOOT OSTEOTOMY  5 years ago   Right great toe  HAND SURGERY Bilateral 2011-most recent   multiple hand surgeries, 2 on left, 3 on right   KNEE ARTHROPLASTY Right 04/28/2022   Procedure: COMPUTER ASSISTED TOTAL KNEE ARTHROPLASTY;  Surgeon: Donato Heinz, MD;  Location: [facility];  Service: Orthopedics;  Laterality: Right;   LOOP RECORDER INSERTION N/A 05/16/2020   Procedure: LOOP RECORDER INSERTION;  Surgeon: Marcina Millard, MD;  Location: [facility];  Service: Cardiovascular;  Laterality: N/A;   NASAL SINUS SURGERY  most recent 7-8 yrs ago   7 sinus surgeries    TRIGGER FINGER RELEASE  11/19/2011    Procedure: RELEASE TRIGGER FINGER/A-1 PULLEY;  Surgeon: Nicki Reaper, MD;  Location: [facility];  Service: Orthopedics;  Laterality: Right;  release a-1 pulley right index finger and cyst removal   WRIST GANGLION EXCISION  1980's   right    FAMILY HISTORY: Family History  Problem Relation Age of Onset   Anesthesia problems Father        "bad lungs" couldn't wake him up   Heart disease Father    Heart attack Father 50   Breast cancer Maternal Grandmother    Breast cancer Maternal Aunt        x 2   Breast cancer Cousin    Pancreatic cancer Cousin    Heart attack Paternal Uncle    Stroke Paternal Grandfather     ADVANCED DIRECTIVES (Y/N):  N  HEALTH MAINTENANCE: Social History   Tobacco Use   Smoking status: Never   Smokeless tobacco: Never  Vaping Use   Vaping status: Never Used  Substance Use Topics   Alcohol use: Not Currently    Alcohol/week: 0.0 - 1.0 standard drinks of alcohol    Comment: wine once every 2-3 months   Drug use: Never     Colonoscopy:  PAP:  Bone density:  Lipid panel:  Allergies  Allergen Reactions   Cinobac [Cinoxacin] Anaphylaxis    "My throat swelled and I stopped breathing."   Latex Dermatitis    IgE = 14 (WNL) on 04/17/2022  Pt is highly reactive to any latex products. Blisters skin. "Takes my skin off"  "Skin will tears off"  IgE = 14 (WNL) on 04/17/2022  Pt is highly reactive to any latex products. Blisters skin. "Takes my skin off"   Other Shortness Of Breath    Muscle relaxers? Pt thinks allergic to muscle relaxers, reports stops breathing but not sure what medicine and if for sure muscle relaxer  Petroleum, Chemicals, Paints, Muscle Relaxers   Petrolatum Distillates [Petroleum Distillate] Shortness Of Breath    Passes out    Sulfa Antibiotics Itching   Meloxicam     Other Reaction(s): Not available, Unknown   Misc. Sulfonamide Containing Compounds Other (See Comments)   Nylon Itching and Rash    Current  Outpatient Medications  Medication Sig Dispense Refill   acetaminophen (TYLENOL) 325 MG tablet Take 2 tablets (650 mg total) by mouth every 4 (four) hours as needed for mild pain (or temp > 37.5 C (99.5 F)).     albuterol (VENTOLIN HFA) 108 (90 Base) MCG/ACT inhaler Inhale 2 puffs into the lungs every 4 (four) hours as needed for wheezing or shortness of breath. 8 g 0   amLODipine (NORVASC) 10 MG tablet Take 1 tablet by mouth daily.     clopidogrel (PLAVIX) 75 MG tablet Take 1 tablet (75 mg total) by mouth daily. 30 tablet 0   diclofenac Sodium (VOLTAREN) 1 % GEL Apply 2 g  topically 4 (four) times daily. (Patient taking differently: Apply 2 g topically 4 (four) times daily as needed.) 2 g 0   DULoxetine (CYMBALTA) 30 MG capsule Take 1 capsule (30 mg total) by mouth daily. 30 capsule 0   folic acid (FOLVITE) 800 MCG tablet Take 1 tablet (800 mcg total) by mouth daily. 30 tablet 0   gabapentin (NEURONTIN) 300 MG capsule Take 1 capsule (300 mg total) by mouth 2 (two) times daily. 60 capsule 0   KLOR-CON M10 10 MEQ tablet Take 20 mEq by mouth daily.     levothyroxine (SYNTHROID) 50 MCG tablet Take 50 mcg by mouth daily before breakfast.     Multiple Vitamins-Minerals (MULTIVITAMIN GUMMIES ADULT) CHEW Chew 2 capsules by mouth daily.     ondansetron (ZOFRAN-ODT) 4 MG disintegrating tablet Take 1 tablet (4 mg total) by mouth every 8 (eight) hours as needed for nausea or vomiting. 8 tablet 0   pantoprazole (PROTONIX) 40 MG tablet TAKE 1 TABLET BY MOUTH EVERY DAY 90 tablet 0   propranolol ER (INDERAL LA) 60 MG 24 hr capsule Take 1 capsule (60 mg total) by mouth daily. 30 capsule 0   aspirin EC 81 MG tablet Take 1 tablet (81 mg total) by mouth daily. Swallow whole. (Patient not taking: Reported on 02/03/2024) 5 tablet 0   atorvastatin (LIPITOR) 40 MG tablet Take 1 tablet (40 mg total) by mouth daily. (Patient not taking: Reported on 07/31/2023) 30 tablet 0   iron polysaccharides (NIFEREX) 150 MG capsule Take 1  capsule (150 mg total) by mouth daily. 30 capsule 0   montelukast (SINGULAIR) 10 MG tablet Take 10 mg by mouth at bedtime.     No current facility-administered medications for this visit.   Facility-Administered Medications Ordered in Other Visits  Medication Dose Route Frequency Provider Last Rate Last Admin   0.9 %  sodium chloride infusion (Manually program via Guardrails IV Fluids)  250 mL Intravenous Continuous Jeralyn Ruths, MD 10 mL/hr at 01/25/24 0919 100 mL at 01/25/24 0919    OBJECTIVE: Vitals:   02/03/24 0949  BP: (!) 114/56  Pulse: 74  Resp: 16  Temp: 97.7 F (36.5 C)  SpO2: 99%     Body mass index is 21.13 kg/m.    ECOG FS:1 - Symptomatic but completely ambulatory  General: Well-developed, well-nourished, no acute distress. Eyes: Pink conjunctiva, anicteric sclera. HEENT: Normocephalic, moist mucous membranes. Lungs: No audible wheezing or coughing. Heart: Regular rate and rhythm. Abdomen: Soft, nontender, no obvious distention. Musculoskeletal: No edema, cyanosis, or clubbing. Neuro: Alert, answering all questions appropriately. Cranial nerves grossly intact. Skin: No rashes or petechiae noted. Psych: Normal affect.  LAB RESULTS:  Lab Results  Component Value Date   NA 139 01/11/2024   K 4.0 01/11/2024   CL 103 01/11/2024   CO2 24 01/11/2024   GLUCOSE 103 (H) 01/11/2024   BUN 13 01/11/2024   CREATININE 0.98 01/11/2024   CALCIUM 9.6 01/11/2024   PROT 6.9 01/11/2024   ALBUMIN 3.7 01/11/2024   AST 23 01/11/2024   ALT 14 01/11/2024   ALKPHOS 85 01/11/2024   BILITOT 0.4 01/11/2024   GFRNONAA 58 (L) 01/11/2024   GFRAA >60 09/28/2016    Lab Results  Component Value Date   WBC 11.9 (H) 02/03/2024   NEUTROABS 7.9 (H) 02/03/2024   HGB 11.3 (L) 02/03/2024   HCT 40.1 02/03/2024   MCV 74.5 (L) 02/03/2024   PLT 698 (H) 02/03/2024   Lab Results  Component Value Date  FERRITIN 5 (L) 01/14/2024   Lab Results  Component Value Date   IRON 23 (L)  01/14/2024   TIBC 512 (H) 01/14/2024   IRONPCTSAT 5 (L) 01/14/2024     STUDIES: DG Chest 2 View Result Date: 01/11/2024 CLINICAL DATA:  Fatigue EXAM: CHEST - 2 VIEW COMPARISON:  11/23/2022 FINDINGS: Normal cardiomediastinal silhouette with aortic atherosclerosis. Mild reticular and linear interstitial changes at the left base. Nor recording device over the left chest. IMPRESSION: Mild reticular and linear interstitial changes at the left base, likely chronic. No acute airspace disease Electronically Signed   By: Jasmine Pang M.D.   On: 01/11/2024 22:14    ASSESSMENT: Thrombocytosis, iron deficiency anemia.  PLAN:    Iron deficiency anemia: Possibly secondary to poor absorption or decreased dietary intake.  Patient's hemoglobin has significantly improved to 11.3 with transfusion 2 weeks ago.  She has also received 1 dose of 200 mg IV Venofer.  Proceed with a second dose of treatment today.  Patient will then return to clinic 3 times over the next 1 to 2 weeks for additional IV Venofer.  Return to clinic in 3 months with repeat laboratory work, further evaluation, and continuation of IV iron if needed.   Thrombocytosis: Improving with correction of patient's iron deficiency anemia.  All of her other blood work including JAK2 mutation with reflex, flow cytometry, BCR-ABL mutation, and IntelliGEN myeloid panel are all either negative or within normal limits. Joint pain: Okay to proceed with knee replacement surgery from a hematology standpoint.  I spent a total of 30 minutes reviewing chart data, face-to-face evaluation with the patient, counseling and coordination of care as detailed above.    Patient expressed understanding and was in agreement with this plan. She also understands that She can call clinic at any time with any questions, concerns, or complaints.    Jeralyn Ruths, MD   02/03/2024 10:13 AM

## 2024-02-03 NOTE — Patient Instructions (Signed)

## 2024-02-04 ENCOUNTER — Ambulatory Visit: Payer: Medicare HMO | Admitting: Physical Therapy

## 2024-02-04 ENCOUNTER — Inpatient Hospital Stay: Payer: Medicare HMO

## 2024-02-04 ENCOUNTER — Ambulatory Visit: Payer: Medicare HMO | Admitting: Occupational Therapy

## 2024-02-05 LAB — INTELLIGEN MYELOID

## 2024-02-09 ENCOUNTER — Ambulatory Visit: Payer: Medicare HMO | Admitting: Occupational Therapy

## 2024-02-09 ENCOUNTER — Inpatient Hospital Stay: Payer: Medicare HMO | Attending: Oncology

## 2024-02-09 ENCOUNTER — Ambulatory Visit: Payer: Medicare HMO | Admitting: Physical Therapy

## 2024-02-09 VITALS — BP 117/54 | HR 72 | Temp 97.4°F | Resp 16

## 2024-02-09 DIAGNOSIS — E079 Disorder of thyroid, unspecified: Secondary | ICD-10-CM | POA: Diagnosis not present

## 2024-02-09 DIAGNOSIS — R7303 Prediabetes: Secondary | ICD-10-CM | POA: Diagnosis not present

## 2024-02-09 DIAGNOSIS — D509 Iron deficiency anemia, unspecified: Secondary | ICD-10-CM | POA: Diagnosis not present

## 2024-02-09 DIAGNOSIS — F419 Anxiety disorder, unspecified: Secondary | ICD-10-CM | POA: Diagnosis not present

## 2024-02-09 DIAGNOSIS — E785 Hyperlipidemia, unspecified: Secondary | ICD-10-CM | POA: Diagnosis not present

## 2024-02-09 DIAGNOSIS — Z0000 Encounter for general adult medical examination without abnormal findings: Secondary | ICD-10-CM | POA: Diagnosis not present

## 2024-02-09 DIAGNOSIS — I10 Essential (primary) hypertension: Secondary | ICD-10-CM | POA: Diagnosis not present

## 2024-02-09 MED ORDER — IRON SUCROSE 20 MG/ML IV SOLN
200.0000 mg | Freq: Once | INTRAVENOUS | Status: AC
  Administered 2024-02-09: 200 mg via INTRAVENOUS
  Filled 2024-02-09: qty 10

## 2024-02-09 NOTE — Patient Instructions (Signed)

## 2024-02-11 ENCOUNTER — Ambulatory Visit: Payer: Medicare HMO | Admitting: Occupational Therapy

## 2024-02-11 ENCOUNTER — Ambulatory Visit: Payer: Medicare HMO | Admitting: Physical Therapy

## 2024-02-11 ENCOUNTER — Inpatient Hospital Stay: Payer: Medicare HMO

## 2024-02-11 VITALS — BP 108/58 | HR 72 | Temp 96.6°F | Resp 18

## 2024-02-11 DIAGNOSIS — D509 Iron deficiency anemia, unspecified: Secondary | ICD-10-CM

## 2024-02-11 MED ORDER — IRON SUCROSE 20 MG/ML IV SOLN
200.0000 mg | Freq: Once | INTRAVENOUS | Status: AC
  Administered 2024-02-11: 200 mg via INTRAVENOUS

## 2024-02-11 NOTE — Patient Instructions (Signed)

## 2024-02-15 ENCOUNTER — Inpatient Hospital Stay: Payer: Medicare HMO

## 2024-02-15 VITALS — BP 115/60 | HR 74 | Temp 97.1°F | Resp 18

## 2024-02-15 DIAGNOSIS — D509 Iron deficiency anemia, unspecified: Secondary | ICD-10-CM

## 2024-02-15 MED ORDER — SODIUM CHLORIDE 0.9% FLUSH
10.0000 mL | Freq: Once | INTRAVENOUS | Status: AC | PRN
  Administered 2024-02-15: 10 mL
  Filled 2024-02-15: qty 10

## 2024-02-15 MED ORDER — IRON SUCROSE 20 MG/ML IV SOLN
200.0000 mg | Freq: Once | INTRAVENOUS | Status: AC
  Administered 2024-02-15: 200 mg via INTRAVENOUS

## 2024-02-16 ENCOUNTER — Ambulatory Visit: Payer: Medicare HMO | Admitting: Occupational Therapy

## 2024-02-16 ENCOUNTER — Ambulatory Visit: Payer: Medicare HMO | Admitting: Physical Therapy

## 2024-02-18 ENCOUNTER — Ambulatory Visit: Payer: Medicare HMO | Admitting: Physical Therapy

## 2024-02-18 ENCOUNTER — Ambulatory Visit: Payer: Medicare HMO | Admitting: Occupational Therapy

## 2024-02-18 NOTE — Discharge Instructions (Signed)
 Instructions after Total Knee Replacement   Emmilia Sowder P. Angie Fava., M.D.    Dept. of Orthopaedics & Sports Medicine [facility] [address] [place], [place]  [address]  Phone: [phone]   Fax: [phone]       [email or web address]       DIET: Drink plenty of non-alcoholic fluids. Resume your normal diet. Include foods high in fiber.  ACTIVITY:  You may use crutches or a walker with weight-bearing as tolerated, unless instructed otherwise. You may be weaned off of the walker or crutches by your Physical Therapist.  Do NOT place pillows under the knee. Anything placed under the knee could limit your ability to straighten the knee.   Use the Bone Foam 3 times a day for 30 minutes each session to help straighten the knee. Continue doing gentle exercises. Exercising will reduce the pain and swelling, increase motion, and prevent muscle weakness.   Please continue to use the TED compression stockings for 6 weeks. You may remove the stockings at night, but should reapply them in the morning. Do not drive or operate any equipment until instructed.  WOUND CARE:  The initial dressing (Aquacel) can remain in place for 7 days (see separate instructions). Continue to use the PolarCare or ice packs periodically to reduce pain and swelling. You may bathe or shower after the staples are removed at the first office visit following surgery.  MEDICATIONS: You may resume your regular medications. Please take the pain medication as prescribed on the medication. Do not take pain medication on an empty stomach. Unless instructed otherwise, you should take an enteric-coated aspirin 81 mg. TWICE a day. (This along with elevation will help reduce the possibility of blood clots/phlebitis in your operated leg.) Use a stool softener (such as Senokot-S or Colace) daily and a laxative (such as Miralax or Dulcolax) as needed to prevent constipation.  Do not drive or drink alcoholic beverages when  taking pain medications.  CALL THE OFFICE FOR: Temperature above 101 degrees Excessive bleeding or drainage on the dressing. Excessive swelling, coldness, or paleness of the toes. Persistent nausea and vomiting.  FOLLOW-UP:  You should have an appointment to return to the office in 10-14 days after surgery. Arrangements have been made for continuation of Physical Therapy (either home therapy or outpatient therapy).     [facility] Department Directory         [email or web address]       [email or web address]          Cardiology  Appointments: [place] - [phone]  Endocrinology  Appointments: [place] [phone] [place] - [phone]  Gastroenterology  Appointments: [employer] [phone] [place] - [phone]        General Surgery   Appointments: [facility]  Internal Medicine/Family Medicine  Appointments: [facility] [place] - [phone] [place] - [phone]  Metabolic and Weigh Loss Surgery  Appointments: [facility]        Neurology  Appointments: [place] [phone] [place] - [phone]  Neurosurgery  Appointments: [place]  Obstetrics & Gynecology  Appointments: [place] [phone] [place] - [phone]        Pediatrics  Appointments: [place] [phone] [place] - [phone]  Physiatry  Appointments: [place] [phone]  Physical Therapy  Appointments: [place] - [phone]        Podiatry  Appointments: [place] [phone] [place] - [phone]  Pulmonology  Appointments: [facility]  Rheumatology  Appointments: [place] [phone]  [place] Location: [facility]  [address] [place], [place]  [address]  [place] Location: [facility]. [address] [place], [place]  [address]  [place]  Location: [facility] [address] [place], [place]  [address]

## 2024-02-23 ENCOUNTER — Ambulatory Visit: Payer: Medicare HMO | Admitting: Occupational Therapy

## 2024-02-23 ENCOUNTER — Ambulatory Visit: Payer: Medicare HMO | Admitting: Physical Therapy

## 2024-02-23 ENCOUNTER — Other Ambulatory Visit: Payer: Self-pay

## 2024-02-23 ENCOUNTER — Encounter
Admission: RE | Admit: 2024-02-23 | Discharge: 2024-02-23 | Disposition: A | Discharge status: Home / Self Care | Source: Ambulatory Visit | Attending: Orthopedic Surgery | Admitting: Orthopedic Surgery

## 2024-02-23 VITALS — BP 116/61 | HR 70 | Temp 97.5°F | Resp 17 | Ht 59.0 in | Wt 100.0 lb

## 2024-02-23 DIAGNOSIS — Z01812 Encounter for preprocedural laboratory examination: Secondary | ICD-10-CM | POA: Insufficient documentation

## 2024-02-23 DIAGNOSIS — R8271 Bacteriuria: Secondary | ICD-10-CM | POA: Diagnosis not present

## 2024-02-23 DIAGNOSIS — R8281 Pyuria: Secondary | ICD-10-CM | POA: Insufficient documentation

## 2024-02-23 DIAGNOSIS — D72829 Elevated white blood cell count, unspecified: Secondary | ICD-10-CM | POA: Insufficient documentation

## 2024-02-23 DIAGNOSIS — D649 Anemia, unspecified: Secondary | ICD-10-CM | POA: Diagnosis not present

## 2024-02-23 DIAGNOSIS — R8290 Unspecified abnormal findings in urine: Secondary | ICD-10-CM | POA: Diagnosis not present

## 2024-02-23 DIAGNOSIS — D509 Iron deficiency anemia, unspecified: Secondary | ICD-10-CM | POA: Insufficient documentation

## 2024-02-23 DIAGNOSIS — M1712 Unilateral primary osteoarthritis, left knee: Secondary | ICD-10-CM | POA: Insufficient documentation

## 2024-02-23 DIAGNOSIS — Z01818 Encounter for other preprocedural examination: Secondary | ICD-10-CM | POA: Diagnosis present

## 2024-02-23 HISTORY — DX: Unspecified ovarian cyst, unspecified side: N83.209

## 2024-02-23 HISTORY — DX: Other specified postprocedural states: Z98.890

## 2024-02-23 HISTORY — DX: Nausea with vomiting, unspecified: R11.2

## 2024-02-23 LAB — COMPREHENSIVE METABOLIC PANEL
ALT: 11 U/L (ref 0–44)
AST: 20 U/L (ref 15–41)
Albumin: 3.7 g/dL (ref 3.5–5.0)
Alkaline Phosphatase: 87 U/L (ref 38–126)
Anion gap: 10 (ref 5–15)
BUN: 9 mg/dL (ref 8–23)
CO2: 24 mmol/L (ref 22–32)
Calcium: 9.8 mg/dL (ref 8.9–10.3)
Chloride: 106 mmol/L (ref 98–111)
Creatinine, Ser: 1.04 mg/dL — ABNORMAL HIGH (ref 0.44–1.00)
GFR, Estimated: 54 mL/min — ABNORMAL LOW (ref >60)
Glucose, Bld: 121 mg/dL — ABNORMAL HIGH (ref 70–99)
Potassium: 3.8 mmol/L (ref 3.5–5.1)
Sodium: 140 mmol/L (ref 135–145)
Total Bilirubin: 0.8 mg/dL (ref 0.0–1.2)
Total Protein: 6.7 g/dL (ref 6.5–8.1)

## 2024-02-23 LAB — CBC
HCT: 38.7 % (ref 36.0–46.0)
Hemoglobin: 11.5 g/dL — ABNORMAL LOW (ref 12.0–15.0)
MCH: 24.4 pg — ABNORMAL LOW (ref 26.0–34.0)
MCHC: 29.7 g/dL — ABNORMAL LOW (ref 30.0–36.0)
MCV: 82.2 fL (ref 80.0–100.0)
Platelets: 746 10*3/uL — ABNORMAL HIGH (ref 150–400)
RBC: 4.71 MIL/uL (ref 3.87–5.11)
RDW: 27.9 % — ABNORMAL HIGH (ref 11.5–15.5)
WBC: 14.7 10*3/uL — ABNORMAL HIGH (ref 4.0–10.5)
nRBC: 0 % (ref 0.0–0.2)

## 2024-02-23 LAB — URINALYSIS, ROUTINE W REFLEX MICROSCOPIC
Bilirubin Urine: NEGATIVE
Glucose, UA: NEGATIVE mg/dL
Hgb urine dipstick: NEGATIVE
Ketones, ur: NEGATIVE mg/dL
Nitrite: NEGATIVE
Protein, ur: 30 mg/dL — AB
Specific Gravity, Urine: 1.014 (ref 1.005–1.030)
WBC, UA: >50 WBC/hpf (ref 0–5)
pH: 5 (ref 5.0–8.0)

## 2024-02-23 LAB — SURGICAL PCR SCREEN
MRSA, PCR: NEGATIVE
Staphylococcus aureus: NEGATIVE

## 2024-02-23 NOTE — Patient Instructions (Addendum)
 Your procedure is scheduled on: Wednesday, March 26 Report to the Registration Desk on the 1st floor of the [facility] To find out your arrival time, please call [phone] between 1PM - 3PM on: Tuesday, March 25 If your arrival time is 6:00 am, do not arrive before that time as the [facility] entrance doors do not open until 6:00 am.  REMEMBER: Instructions that are not followed completely may result in serious medical risk, up to and including death; or upon the discretion of your surgeon and anesthesiologist your surgery may need to be rescheduled.  Do not eat food after midnight the night before surgery.  No gum chewing or hard candies.  You may however, drink CLEAR liquids up to 2 hours before you are scheduled to arrive for your surgery. Do not drink anything within 2 hours of your scheduled arrival time.  Clear liquids include: - water  - apple juice without pulp - gatorade (not RED colors) - black coffee or tea (Do NOT add milk or creamers to the coffee or tea) Do NOT drink anything that is not on this list.  In addition, your doctor has ordered for you to drink the provided:  Ensure Pre-Surgery Clear Carbohydrate Drink  Drinking this carbohydrate drink up to two hours before surgery helps to reduce insulin resistance and improve patient outcomes. Please complete drinking 2 hours before scheduled arrival time.  One week prior to surgery: starting March 19 Stop Anti-inflammatories (NSAIDS) such as Advil, Aleve, Ibuprofen, Motrin, Naproxen, Naprosyn and Aspirin based products such as Excedrin, Goody's Powder, BC Powder. Stop ANY OVER THE COUNTER supplements until after surgery. Stop multiple vitamins, hair,skin,nails, magnesium, vitamin D, folic acid.  You may however, continue to take Tylenol if needed for pain up until the day of surgery.  You many continue taking the 81 mg aspirin up until the day of surgery.  clopidogrel (PLAVIX) - hold for 7 days before surgery.  Last day to take is Tuesday, March 18. Resume AFTER surgery per surgeon instruction.  Continue taking all of your other prescription medications up until the day of surgery.  ON THE DAY OF SURGERY ONLY TAKE THESE MEDICATIONS WITH SIPS OF WATER:  Amlodipine Atorvastatin Levothyroxine Pantoprazole Propranolol  Use albuterol inhaler on the day of surgery and bring to the hospital.  No Alcohol for 24 hours before or after surgery.  No Smoking including e-cigarettes for 24 hours before surgery.  No chewable tobacco products for at least 6 hours before surgery.  No nicotine patches on the day of surgery.  Do not use any "recreational" drugs for at least a week (preferably 2 weeks) before your surgery.  Please be advised that the combination of cocaine and anesthesia may have negative outcomes, up to and including death. If you test positive for cocaine, your surgery will be cancelled.  On the morning of surgery brush your teeth with toothpaste and water, you may rinse your mouth with mouthwash if you wish. Do not swallow any toothpaste or mouthwash.  Use CHG Soap as directed on instruction sheet.  Do not wear jewelry, make-up, hairpins, clips or nail polish.  For welded (permanent) jewelry: bracelets, anklets, waist bands, etc.  Please have this removed prior to surgery.  If it is not removed, there is a chance that hospital personnel will need to cut it off on the day of surgery.  Do not wear lotions, powders, or perfumes.   Do not shave body hair from the neck down 48 hours before  surgery.  Contact lenses, hearing aids and dentures may not be worn into surgery.  Do not bring valuables to the hospital. [facility] is not responsible for any missing/lost belongings or valuables.   Notify your doctor if there is any change in your medical condition (cold, fever, infection).  Wear comfortable clothing (specific to your surgery type) to the hospital.  After surgery, you can help  prevent lung complications by doing breathing exercises.  Take deep breaths and cough every 1-2 hours. Your doctor may order a device called an Incentive Spirometer to help you take deep breaths.  If you are being admitted to the hospital overnight, leave your suitcase in the car. After surgery it may be brought to your room.  In case of increased patient census, it may be necessary for you, the patient, to continue your postoperative care in the Same Day Surgery department.  If you are being discharged the day of surgery, you will not be allowed to drive home. You will need a responsible individual to drive you home and stay with you for 24 hours after surgery.   If you are taking public transportation, you will need to have a responsible individual with you.  Please call the Pre-admissions Testing Dept. at [phone] if you have any questions about these instructions.  Surgery Visitation Policy:  Patients having surgery or a procedure may have two visitors.  Children under the age of 67 must have an adult with them who is not the patient.  Temporary Visitor Restrictions Due to increasing cases of flu, RSV and COVID-19: Children ages 70 and under will not be able to visit patients in [facility] hospitals under most circumstances.  Inpatient Visitation:    Visiting hours are 7 a.m. to 8 p.m. Up to four visitors are allowed at one time in a patient room. The visitors may rotate out with other people during the day.  One visitor age 77 or older may stay with the patient overnight and must be in the room by 8 p.m.        Pre-operative 5 CHG Bath Instructions   You can play a key role in reducing the risk of infection after surgery. Your skin needs to be as free of germs as possible. You can reduce the number of germs on your skin by washing with CHG (chlorhexidine gluconate) soap before surgery. CHG is an antiseptic soap that kills germs and continues to kill germs even after  washing.   DO NOT use if you have an allergy to chlorhexidine/CHG or antibacterial soaps. If your skin becomes reddened or irritated, stop using the CHG and notify one of our RNs at [phone].   Please shower with the CHG soap starting 4 days before surgery using the following schedule:     Please keep in mind the following:  DO NOT shave, including legs and underarms, starting the day of your first shower.   You may shave your face at any point before/day of surgery.  Place clean sheets on your bed the day you start using CHG soap. Use a clean washcloth (not used since being washed) for each shower. DO NOT sleep with pets once you start using the CHG.   CHG Shower Instructions:  If you choose to wash your hair and private area, wash first with your normal shampoo/soap.  After you use shampoo/soap, rinse your hair and body thoroughly to remove shampoo/soap residue.  Turn the water OFF and apply about 3 tablespoons (45 ml)  of CHG soap to a CLEAN washcloth.  Apply CHG soap ONLY FROM YOUR NECK DOWN TO YOUR TOES (washing for 3-5 minutes)  DO NOT use CHG soap on face, private areas, open wounds, or sores.  Pay special attention to the area where your surgery is being performed.  If you are having back surgery, having someone wash your back for you may be helpful. Wait 2 minutes after CHG soap is applied, then you may rinse off the CHG soap.  Pat dry with a clean towel  Put on clean clothes/pajamas   If you choose to wear lotion, please use ONLY the CHG-compatible lotions on the back of this paper.     Additional instructions for the day of surgery: DO NOT APPLY any lotions, deodorants, cologne, or perfumes.   Put on clean/comfortable clothes.  Brush your teeth.  Ask your nurse before applying any prescription medications to the skin.      CHG Compatible Lotions   Aveeno Moisturizing lotion  Cetaphil Moisturizing Cream  Cetaphil Moisturizing Lotion  Clairol Herbal Essence  Moisturizing Lotion, Dry Skin  Clairol Herbal Essence Moisturizing Lotion, Extra Dry Skin  Clairol Herbal Essence Moisturizing Lotion, Normal Skin  Curel Age Defying Therapeutic Moisturizing Lotion with Alpha Hydroxy  Curel Extreme Care Body Lotion  Curel Soothing Hands Moisturizing Hand Lotion  Curel Therapeutic Moisturizing Cream, Fragrance-Free  Curel Therapeutic Moisturizing Lotion, Fragrance-Free  Curel Therapeutic Moisturizing Lotion, Original Formula  Eucerin Daily Replenishing Lotion  Eucerin Dry Skin Therapy Plus Alpha Hydroxy Crme  Eucerin Dry Skin Therapy Plus Alpha Hydroxy Lotion  Eucerin Original Crme  Eucerin Original Lotion  Eucerin Plus Crme Eucerin Plus Lotion  Eucerin TriLipid Replenishing Lotion  Keri Anti-Bacterial Hand Lotion  Keri Deep Conditioning Original Lotion Dry Skin Formula Softly Scented  Keri Deep Conditioning Original Lotion, Fragrance Free Sensitive Skin Formula  Keri Lotion Fast Absorbing Fragrance Free Sensitive Skin Formula  Keri Lotion Fast Absorbing Softly Scented Dry Skin Formula  Keri Original Lotion  Keri Skin Renewal Lotion Keri Silky Smooth Lotion  Keri Silky Smooth Sensitive Skin Lotion  Nivea Body Creamy Conditioning Oil  Nivea Body Extra Enriched Lotion  Nivea Body Original Lotion  Nivea Body Sheer Moisturizing Lotion Nivea Crme  Nivea Skin Firming Lotion  NutraDerm 30 Skin Lotion  NutraDerm Skin Lotion  NutraDerm Therapeutic Skin Cream  NutraDerm Therapeutic Skin Lotion  ProShield Protective Hand Cream  Provon moisturizing lotion     Preoperative Educational Videos for Total Hip, Knee and Shoulder Replacements  To better prepare for surgery, please view our videos that explain the physical activity and discharge planning required to have the best surgical recovery at [facility].  [email or web address]  Questions? Call [phone] or email  [email or web address]

## 2024-02-24 NOTE — Progress Notes (Signed)
  [facility] Perioperative Services: Pre-Admission/Anesthesia Testing  Abnormal Lab Notification   Date: 02/24/24  Name: [name] MRN:   [id number]  Re: Abnormal labs noted during PAT appointment   Notified:  Provider Name Provider Role Notification Mode  Francesco Sor, MD Orthopedics (Surgeon) Routed and/or faxed via [facility] Gastroenterology   Abnormal Lab Value(s):   Lab Results  Component Value Date   COLORURINE YELLOW (A) 02/23/2024   APPEARANCEUR TURBID (A) 02/23/2024   LABSPEC 1.014 02/23/2024   PHURINE 5.0 02/23/2024   GLUCOSEU NEGATIVE 02/23/2024   HGBUR NEGATIVE 02/23/2024   BILIRUBINUR NEGATIVE 02/23/2024   KETONESUR NEGATIVE 02/23/2024   PROTEINUR 30 (A) 02/23/2024   UROBILINOGEN 0.2 04/11/2011   NITRITE NEGATIVE 02/23/2024   LEUKOCYTESUR MODERATE (A) 02/23/2024   EPIU 0-5 02/23/2024   WBCU >50 02/23/2024   RBCU 6-10 02/23/2024   BACTERIA RARE (A) 02/23/2024   CULT (A) 02/23/2024    >=100,000 COLONIES/mL GRAM NEGATIVE RODS SUSCEPTIBILITIES TO FOLLOW Performed at [facility], [address], [place], [place] [address]    Clinical Information and Notes:  Patient is scheduled for ARTHROPLASTY, KNEE, TOTAL, USING IMAGELESS COMPUTER-ASSISTED NAVIGATION (Left: Knee) on 03/02/2024.    UA performed in PAT consistent with/concerning for infection.  (+) leukocytosis noted on CBC; WBC 14.7 Renal function: Estimated Creatinine Clearance: 28.9 mL/min (A) (by C-G formula based on SCr of 1.04 mg/dL (H)). Urine C&S added to assess for pathogenically significant growth.   Impression and Plan:  Emery C Nations with a UA that was (+) for infection; reflex culture sent. Preliminary culture (+) for significant GNR colony count; final pathogen ID and susceptibilities pending. Will plan on forwarding final culture results to MD as they become available to me. Sending results for review and consideration of preoperative treatment as deemed appropriate by Dr. Ernest Pine.    Quentin Mulling, MSN, APRN, FNP-C, CEN [facility]  Perioperative Services Nurse Practitioner Phone: [phone] Fax: [phone] 02/24/24 2:52 PM  NOTE: This note has been prepared using Dragon dictation software. Despite my best ability to proofread, there is always the potential that unintentional transcriptional errors may still occur from this process.

## 2024-02-25 ENCOUNTER — Ambulatory Visit: Payer: Medicare HMO | Admitting: Physical Therapy

## 2024-02-25 ENCOUNTER — Ambulatory Visit: Payer: Medicare HMO | Admitting: Occupational Therapy

## 2024-02-25 LAB — URINE CULTURE: Culture: >=100000 — AB

## 2024-02-29 DIAGNOSIS — M1712 Unilateral primary osteoarthritis, left knee: Secondary | ICD-10-CM | POA: Diagnosis not present

## 2024-03-01 ENCOUNTER — Ambulatory Visit: Payer: Medicare HMO | Admitting: Physical Therapy

## 2024-03-01 ENCOUNTER — Encounter: Payer: Medicare HMO | Admitting: Occupational Therapy

## 2024-03-01 NOTE — H&P (Signed)
 ORTHOPAEDIC HISTORY & PHYSICAL Raenette Rover, [place] - 02/29/2024 3:30 PM EDT Formatting of this note is different from the original. NAME: [name] H&P Date: 02/29/2024 Procedure Date: 03/02/2024  Chief Complaint: left knee pain  HPI [name] is a 83 y.o. female who has severe Left knee pain. Patient presents today with her husband for reevaluation and discussion of surgery. Patient reports a longstanding history of bilateral knee pain. She does have a history of a right total knee arthroplasty performed by Dr. Ernest Pine in May 2023. She states that this knee seems to be doing very well, and would like to have her left knee replaced as it has been increasingly bothering her. She was previously scheduled to have a left total knee arthroplasty done in September 2023, however had a CVA at this time and surgery was canceled. She has since been cleared by her neurologist for continuation of surgery. Regarding her symptoms, she states that most the pain localizes to the medial aspect of her knee. She does report intermittent swelling and giving way of the knee. Denies any locking symptoms. She states the pain is made worse with any attempted weightbearing or ambulation. It has greatly affected her ability to ambulate long distances and perform her ADLs that she would like. She has failed conservative treatment including Tylenol, intra-articular corticosteroid injections, viscosupplementation injections, knee braces, physical therapy, activity modification and ambulatory aids. She is currently utilizing a wheelchair and a walker for ambulation assistance.. She has requested operative intervention for relief of her DJD symptoms. She denies any cardiac history. 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 Patient does have a recent history of a stroke August 2023. She has no previous surgeries on  this left knee. She is not a diabetic.  Social Hx: Patient lives at home with her husband, and states that she has other family around the area who will help look after her postoperatively. She denies any alcohol or illicit drug use. Denies any smoking, nicotine or tobacco use.  Medications & Allergies Allergies: Allergies Allergen Reactions Iodinated Contrast Media Other (See Comments) "Seizure" Other Shortness Of Breath Petroleum, Chemicals, Paints, Muscle Relaxers   Latex Dermatitis "Skin will tears off" IgE = 14 (WNL) on 04/17/2022 Pt is highly reactive to any latex products. Blisters skin. "Takes my skin off" Sulfa (Sulfonamide Antibiotics) Rash Meloxicam Unknown Nylon Rash Polyester Rash  Home Medicines: Current Outpatient Medications on File Prior to Visit Medication Sig Dispense Refill albuterol (PROAIR HFA) 90 mcg/actuation inhaler Inhale 2 inhalations into the lungs every 6 (six) hours as needed for Wheezing 1 each 3 amLODIPine (NORVASC) 10 MG tablet TAKE 1 TABLET BY MOUTH EVERY DAY 90 tablet 1 atorvastatin (LIPITOR) 40 MG tablet Take 1 tablet (40 mg total) by mouth once daily 90 tablet 1 cholecalciferol (VITAMIN D3) 1000 unit tablet Take by mouth once daily ciprofloxacin HCl (CIPRO) 500 MG tablet Take 1 tablet (500 mg total) by mouth 2 (two) times daily for 5 days 10 tablet 0 clopidogreL (PLAVIX) 75 mg tablet TAKE 1 TABLET BY MOUTH EVERY DAY 90 tablet 3 cyanocobalamin (VITAMIN B12) 1000 MCG tablet Take 1,000 mcg by mouth once daily diclofenac (VOLTAREN) 1 % topical gel Apply 2 g topically 3 (three) times daily DULoxetine (CYMBALTA) 30 MG DR capsule Take 1 capsule (30 mg total) by mouth once daily 90 capsule 3 folic acid (FOLVITE) 1 MG tablet Take  4 mg by mouth once daily.  gabapentin (NEURONTIN) 300 MG capsule TAKE 1 CAPSULE BY MOUTH THREE TIMES A DAY 90 capsule 5 KLOR-CON M10 10 mEq ER tablet TAKE 2 TABLETS BY MOUTH 2 TIMES DAILY. 90 tablet 1 levothyroxine  (SYNTHROID) 50 MCG tablet TAKE 1 TABLET (50 MCG TOTAL) BY MOUTH ONCE DAILY ON AN EMPTY STOMACH WITH A GLASS OF WATER AT LEAST 30-60 MINUTES BEFORE BREAKFAST 90 tablet 3 MAGNESIUM ORAL Take by mouth once daily montelukast (SINGULAIR) 10 mg tablet TAKE 1 TABLET (10 MG TOTAL) BY MOUTH NIGHTLY. 90 tablet 3 pantoprazole (PROTONIX) 40 MG DR tablet TAKE 1 TABLET BY MOUTH EVERY DAY 90 tablet 3 propranoloL (INDERAL LA) 60 MG LA capsule TAKE 1 CAPSULE BY MOUTH EVERY DAY 90 capsule 3  No current facility-administered medications on file prior to visit.  Medical / Surgical History  Past Medical History: Diagnosis Date Adenomatous colon polyp removed 2005 Anxiety Arthritis Asthma without status asthmaticus (HHS-HCC) Chronic pancreatitis (CMS/HHS-HCC) Environmental allergies History of urticaria Hypertension Hypertriglyceridemia Hypokalemia Nephrolithiasis 2003 passed spontaneously. Non-cardiac chest pain   Past Surgical History: Procedure Laterality Date HYSTERECTOMY 1963 left oophorectomy APPENDECTOMY 1963 OOPHORECTOMY Right 1965 Right total knee arthroplasty using computer-assisted navigation 04/28/2022 Dr Ernest Pine CARPAL TUNNEL RELEASE Multiple sinus surgeries At least five. 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 OOPHORECTOMY Surgery of the foot and hand.   Physical Exam  Ht: Wt: BMI: There is no height or weight on file to calculate BMI.  General/Constitutional: No apparent distress: well-nourished and well developed. Eyes: Pupils equal, round with synchronous movement. Lymphatic: No palpable adenopathy. Respiratory: Patient has good chest rise and fall with inspiration and expiration. All lung fields are clear to auscultation bilaterally. There is no Rales, rhonchi or wheezes appreciated. Cardiovascular: Upon auscultation there is a regular rate and rhythm without any murmurs, rubs, gallops or heaves appreciated. There does not appear to be any  swelling down the lower extremities. Posterior tibial pulses appreciated bilaterally, 2+. Integumentary: No impressive skin lesions present, except as noted in detailed exam. Neuro/Psych: Normal mood and affect, oriented to person, place and time. Musculoskeletal: see exam below  Left knee exam Upon inspection of the patient's left knee there does not appear to be any skin changes, open abrasions, swelling or redness. There is a varus alignment. Upon palpation, the patient reports having pain along the medial aspect of their knee. Patient has full extension actively with ROM, and able to flex back to 118 degrees with mild pain. Varus and valgus stress testing shows positive pseudolaxity to valgus stressing. The patella tracks well within the femoral groove from flexion into extension with mild crepitus appreciated. Anterior and posterior drawer testing negative. Patient is neurovascularly intact down their lower extremity to all dermatomes. Posterior tibial pulses appreciated 2+.  Imaging left Knee Imaging: A series of x-rays were ordered and interpreted of the patient's left knee. These images included AP weightbearing, lateral and sunrise views. Upon inspection, there is noticeable loss of joint space, most notably along the medial margin. There is 100% narrowing with bone-on-bone articulation noted. Subchondral changes are appreciated. Osteophyte formation is present. Overall alignment is varus. No fractures, lytic lesions or gross deformities appreciated on films.  Assesment and Plan Knee DJD  I have recommended that [name] undergo left total knee replacement. Consents has been signed. The risks, benefits, prognosis and alternatives including but not limited to DVT, PE, infection, neurovascular injury, failure of the procedure and death were explained to  the patient and she is willing to proceed with surgery as described to her by myself. Plan will be for post operative admission of at  least 1 midnight for pain control and PT. She will be managed with DVT prophylaxis, antibiotics preoperatively for 24 hours and aggressive in patient rehab.  Pre, intra and post op interventions were discussed. Patient has good understanding  Medication Reconciliation was performed. Discussed cessation of clopidogrel, vitamins and supplements.  A total of 45 minutes was spent reviewing patient's charts, medical reconciliation, discussing/educating the patient about surgical interventions, and answering any questions provided by the patient.  JOSHUA Kendrick Fries, PA [facility] 02/29/2024 Electronically signed by Raenette Rover, PA at 02/29/2024 6:18 PM EDT  B

## 2024-03-02 ENCOUNTER — Observation Stay
Admission: RE | Admit: 2024-03-02 | Discharge: 2024-03-03 | Disposition: A | Discharge status: Home / Self Care | Attending: Orthopedic Surgery | Admitting: Orthopedic Surgery

## 2024-03-02 ENCOUNTER — Ambulatory Visit: Payer: Self-pay | Admitting: Urgent Care

## 2024-03-02 ENCOUNTER — Observation Stay

## 2024-03-02 ENCOUNTER — Other Ambulatory Visit: Payer: Self-pay

## 2024-03-02 ENCOUNTER — Encounter
Admission: RE | Disposition: A | Discharge status: Home / Self Care | Payer: Self-pay | Source: Home / Self Care | Attending: Orthopedic Surgery

## 2024-03-02 ENCOUNTER — Encounter: Payer: Self-pay | Admitting: Orthopedic Surgery

## 2024-03-02 DIAGNOSIS — Z96652 Presence of left artificial knee joint: Secondary | ICD-10-CM

## 2024-03-02 DIAGNOSIS — I5032 Chronic diastolic (congestive) heart failure: Secondary | ICD-10-CM | POA: Insufficient documentation

## 2024-03-02 DIAGNOSIS — Z7902 Long term (current) use of antithrombotics/antiplatelets: Secondary | ICD-10-CM | POA: Insufficient documentation

## 2024-03-02 DIAGNOSIS — J45909 Unspecified asthma, uncomplicated: Secondary | ICD-10-CM | POA: Diagnosis not present

## 2024-03-02 DIAGNOSIS — D649 Anemia, unspecified: Secondary | ICD-10-CM

## 2024-03-02 DIAGNOSIS — I1310 Hypertensive heart and chronic kidney disease without heart failure, with stage 1 through stage 4 chronic kidney disease, or unspecified chronic kidney disease: Secondary | ICD-10-CM | POA: Diagnosis not present

## 2024-03-02 DIAGNOSIS — D631 Anemia in chronic kidney disease: Secondary | ICD-10-CM | POA: Diagnosis not present

## 2024-03-02 DIAGNOSIS — M1712 Unilateral primary osteoarthritis, left knee: Principal | ICD-10-CM

## 2024-03-02 DIAGNOSIS — I10 Essential (primary) hypertension: Secondary | ICD-10-CM | POA: Insufficient documentation

## 2024-03-02 DIAGNOSIS — Z96651 Presence of right artificial knee joint: Secondary | ICD-10-CM | POA: Diagnosis not present

## 2024-03-02 DIAGNOSIS — Z91040 Latex allergy status: Secondary | ICD-10-CM | POA: Diagnosis not present

## 2024-03-02 DIAGNOSIS — N189 Chronic kidney disease, unspecified: Secondary | ICD-10-CM | POA: Diagnosis not present

## 2024-03-02 DIAGNOSIS — Z79899 Other long term (current) drug therapy: Secondary | ICD-10-CM | POA: Insufficient documentation

## 2024-03-02 DIAGNOSIS — D72829 Elevated white blood cell count, unspecified: Secondary | ICD-10-CM

## 2024-03-02 DIAGNOSIS — I110 Hypertensive heart disease with heart failure: Secondary | ICD-10-CM | POA: Insufficient documentation

## 2024-03-02 DIAGNOSIS — R609 Edema, unspecified: Secondary | ICD-10-CM | POA: Diagnosis not present

## 2024-03-02 DIAGNOSIS — D509 Iron deficiency anemia, unspecified: Secondary | ICD-10-CM

## 2024-03-02 HISTORY — PX: KNEE ARTHROPLASTY: SHX992

## 2024-03-02 SURGERY — ARTHROPLASTY, KNEE, TOTAL, USING IMAGELESS COMPUTER-ASSISTED NAVIGATION
Anesthesia: General | Site: Knee | Laterality: Left

## 2024-03-02 MED ORDER — LEVOTHYROXINE SODIUM 25 MCG PO TABS
50.0000 ug | ORAL_TABLET | Freq: Every day | ORAL | Status: DC
  Administered 2024-03-03: 50 ug via ORAL
  Filled 2024-03-02: qty 2

## 2024-03-02 MED ORDER — ACETAMINOPHEN 10 MG/ML IV SOLN: 1000.0000 mg | Freq: Once | INTRAVENOUS | Status: DC | PRN

## 2024-03-02 MED ORDER — CELECOXIB 200 MG PO CAPS
400.0000 mg | ORAL_CAPSULE | Freq: Once | ORAL | Status: AC
  Administered 2024-03-02: 400 mg via ORAL

## 2024-03-02 MED ORDER — CHLORHEXIDINE GLUCONATE 0.12 % MT SOLN
OROMUCOSAL | Status: AC
  Filled 2024-03-02: qty 15

## 2024-03-02 MED ORDER — ONDANSETRON HCL 4 MG PO TABS: 4.0000 mg | ORAL_TABLET | Freq: Four times a day (QID) | ORAL | Status: DC | PRN

## 2024-03-02 MED ORDER — FENTANYL CITRATE (PF) 100 MCG/2ML IJ SOLN
INTRAMUSCULAR | Status: AC
  Filled 2024-03-02: qty 2

## 2024-03-02 MED ORDER — FLEET ENEMA RE ENEM: 1.0000 | ENEMA | Freq: Once | RECTAL | Status: DC | PRN

## 2024-03-02 MED ORDER — SODIUM CHLORIDE (PF) 0.9 % IJ SOLN
INTRAMUSCULAR | Status: DC | PRN
  Administered 2024-03-02: 120 mL via INTRAMUSCULAR

## 2024-03-02 MED ORDER — MENTHOL 3 MG MT LOZG: 1.0000 | LOZENGE | OROMUCOSAL | Status: DC | PRN

## 2024-03-02 MED ORDER — ORAL CARE MOUTH RINSE: 15.0000 mL | Freq: Once | OROMUCOSAL | Status: AC

## 2024-03-02 MED ORDER — MAGNESIUM HYDROXIDE 400 MG/5ML PO SUSP
30.0000 mL | Freq: Every day | ORAL | Status: DC
  Filled 2024-03-02: qty 30

## 2024-03-02 MED ORDER — OXYCODONE HCL 5 MG/5ML PO SOLN: 5.0000 mg | Freq: Once | ORAL | Status: DC | PRN

## 2024-03-02 MED ORDER — DROPERIDOL 2.5 MG/ML IJ SOLN: 0.6250 mg | Freq: Once | INTRAMUSCULAR | Status: DC | PRN

## 2024-03-02 MED ORDER — TRANEXAMIC ACID-NACL 1000-0.7 MG/100ML-% IV SOLN
INTRAVENOUS | Status: AC
  Filled 2024-03-02: qty 100

## 2024-03-02 MED ORDER — DIPHENHYDRAMINE HCL 12.5 MG/5ML PO ELIX: 12.5000 mg | ORAL_SOLUTION | ORAL | Status: DC | PRN

## 2024-03-02 MED ORDER — TRAMADOL HCL 50 MG PO TABS
50.0000 mg | ORAL_TABLET | ORAL | Status: DC | PRN
  Administered 2024-03-02: 50 mg via ORAL
  Filled 2024-03-02: qty 1

## 2024-03-02 MED ORDER — CEFAZOLIN SODIUM-DEXTROSE 2-4 GM/100ML-% IV SOLN
2.0000 g | INTRAVENOUS | Status: AC
  Administered 2024-03-02: 2 g via INTRAVENOUS

## 2024-03-02 MED ORDER — HYDROMORPHONE HCL 1 MG/ML IJ SOLN: 0.5000 mg | INTRAMUSCULAR | Status: DC | PRN

## 2024-03-02 MED ORDER — PHENYLEPHRINE 80 MCG/ML (10ML) SYRINGE FOR IV PUSH (FOR BLOOD PRESSURE SUPPORT)
PREFILLED_SYRINGE | INTRAVENOUS | Status: DC | PRN
  Administered 2024-03-02 (×6): 80 ug via INTRAVENOUS

## 2024-03-02 MED ORDER — TRANEXAMIC ACID-NACL 1000-0.7 MG/100ML-% IV SOLN
1000.0000 mg | INTRAVENOUS | Status: AC
  Administered 2024-03-02: 1000 mg via INTRAVENOUS

## 2024-03-02 MED ORDER — CELECOXIB 200 MG PO CAPS
ORAL_CAPSULE | ORAL | Status: AC
  Filled 2024-03-02: qty 2

## 2024-03-02 MED ORDER — FOLIC ACID 1 MG PO TABS
3000.0000 ug | ORAL_TABLET | Freq: Every day | ORAL | Status: DC
  Administered 2024-03-02 – 2024-03-03 (×2): 3 mg via ORAL
  Filled 2024-03-02 (×2): qty 3

## 2024-03-02 MED ORDER — PROPOFOL 10 MG/ML IV BOLUS
INTRAVENOUS | Status: DC | PRN
  Administered 2024-03-02: 75 ug/kg/min via INTRAVENOUS
  Administered 2024-03-02: 80 mg via INTRAVENOUS

## 2024-03-02 MED ORDER — ROCURONIUM BROMIDE 10 MG/ML (PF) SYRINGE
PREFILLED_SYRINGE | INTRAVENOUS | Status: AC
  Filled 2024-03-02: qty 10

## 2024-03-02 MED ORDER — FENTANYL CITRATE (PF) 100 MCG/2ML IJ SOLN
INTRAMUSCULAR | Status: DC | PRN
  Administered 2024-03-02 (×3): 50 ug via INTRAVENOUS

## 2024-03-02 MED ORDER — POTASSIUM CHLORIDE CRYS ER 20 MEQ PO TBCR
20.0000 meq | EXTENDED_RELEASE_TABLET | Freq: Every day | ORAL | Status: DC
  Administered 2024-03-02: 20 meq via ORAL
  Filled 2024-03-02: qty 1

## 2024-03-02 MED ORDER — MAGNESIUM OXIDE -MG SUPPLEMENT 400 (240 MG) MG PO TABS
200.0000 mg | ORAL_TABLET | Freq: Every day | ORAL | Status: DC
  Administered 2024-03-02 – 2024-03-03 (×2): 200 mg via ORAL
  Filled 2024-03-02 (×2): qty 1

## 2024-03-02 MED ORDER — CELECOXIB 200 MG PO CAPS
200.0000 mg | ORAL_CAPSULE | Freq: Two times a day (BID) | ORAL | Status: DC
  Administered 2024-03-02 – 2024-03-03 (×2): 200 mg via ORAL
  Filled 2024-03-02 (×2): qty 1

## 2024-03-02 MED ORDER — ORAL CARE MOUTH RINSE: 15.0000 mL | OROMUCOSAL | Status: DC | PRN

## 2024-03-02 MED ORDER — ATORVASTATIN CALCIUM 10 MG PO TABS
40.0000 mg | ORAL_TABLET | Freq: Every evening | ORAL | Status: DC
  Administered 2024-03-02: 40 mg via ORAL
  Filled 2024-03-02: qty 4

## 2024-03-02 MED ORDER — ACETAMINOPHEN 10 MG/ML IV SOLN
1000.0000 mg | Freq: Four times a day (QID) | INTRAVENOUS | Status: DC
  Administered 2024-03-02 – 2024-03-03 (×2): 1000 mg via INTRAVENOUS
  Filled 2024-03-02: qty 100

## 2024-03-02 MED ORDER — GABAPENTIN 300 MG PO CAPS
300.0000 mg | ORAL_CAPSULE | Freq: Once | ORAL | Status: AC
  Administered 2024-03-02: 300 mg via ORAL

## 2024-03-02 MED ORDER — PROPOFOL 1000 MG/100ML IV EMUL
INTRAVENOUS | Status: AC
  Filled 2024-03-02: qty 100

## 2024-03-02 MED ORDER — OXYCODONE HCL 5 MG PO TABS: 5.0000 mg | ORAL_TABLET | Freq: Once | ORAL | Status: DC | PRN

## 2024-03-02 MED ORDER — ONDANSETRON HCL 4 MG/2ML IJ SOLN
INTRAMUSCULAR | Status: DC | PRN
  Administered 2024-03-02: 4 mg via INTRAVENOUS

## 2024-03-02 MED ORDER — SENNOSIDES-DOCUSATE SODIUM 8.6-50 MG PO TABS
1.0000 | ORAL_TABLET | Freq: Two times a day (BID) | ORAL | Status: DC
  Administered 2024-03-02 – 2024-03-03 (×2): 1 via ORAL
  Filled 2024-03-02 (×2): qty 1

## 2024-03-02 MED ORDER — OXYCODONE HCL 5 MG PO TABS
5.0000 mg | ORAL_TABLET | ORAL | Status: DC | PRN
  Administered 2024-03-03: 5 mg via ORAL
  Filled 2024-03-02: qty 1

## 2024-03-02 MED ORDER — ONDANSETRON HCL 4 MG/2ML IJ SOLN: 4.0000 mg | Freq: Four times a day (QID) | INTRAMUSCULAR | Status: DC | PRN

## 2024-03-02 MED ORDER — PROPRANOLOL HCL ER 60 MG PO CP24
60.0000 mg | ORAL_CAPSULE | Freq: Every day | ORAL | Status: DC
  Filled 2024-03-02: qty 1

## 2024-03-02 MED ORDER — ACETAMINOPHEN 325 MG PO TABS: 325.0000 mg | ORAL_TABLET | Freq: Four times a day (QID) | ORAL | Status: DC | PRN

## 2024-03-02 MED ORDER — CHLORHEXIDINE GLUCONATE 4 % EX SOLN
60.0000 mL | Freq: Once | CUTANEOUS | Status: AC
  Administered 2024-03-02: 4 via TOPICAL

## 2024-03-02 MED ORDER — ROCURONIUM BROMIDE 100 MG/10ML IV SOLN
INTRAVENOUS | Status: DC | PRN
  Administered 2024-03-02: 30 mg via INTRAVENOUS

## 2024-03-02 MED ORDER — SODIUM CHLORIDE 0.9 % IV SOLN: INTRAVENOUS | Status: DC

## 2024-03-02 MED ORDER — LIDOCAINE HCL (CARDIAC) PF 100 MG/5ML IV SOSY
PREFILLED_SYRINGE | INTRAVENOUS | Status: DC | PRN
  Administered 2024-03-02: 100 mg via INTRAVENOUS

## 2024-03-02 MED ORDER — PANTOPRAZOLE SODIUM 40 MG PO TBEC
40.0000 mg | DELAYED_RELEASE_TABLET | Freq: Two times a day (BID) | ORAL | Status: DC
  Administered 2024-03-02 – 2024-03-03 (×2): 40 mg via ORAL
  Filled 2024-03-02 (×2): qty 1

## 2024-03-02 MED ORDER — ACETAMINOPHEN 10 MG/ML IV SOLN
INTRAVENOUS | Status: DC | PRN
  Administered 2024-03-02: 1000 mg via INTRAVENOUS

## 2024-03-02 MED ORDER — FENTANYL CITRATE (PF) 100 MCG/2ML IJ SOLN: 25.0000 ug | INTRAMUSCULAR | Status: DC | PRN

## 2024-03-02 MED ORDER — FERROUS SULFATE 325 (65 FE) MG PO TABS
325.0000 mg | ORAL_TABLET | Freq: Every day | ORAL | Status: DC
  Administered 2024-03-02 – 2024-03-03 (×2): 325 mg via ORAL
  Filled 2024-03-02 (×2): qty 1

## 2024-03-02 MED ORDER — OXYCODONE HCL 5 MG PO TABS: 10.0000 mg | ORAL_TABLET | ORAL | Status: DC | PRN

## 2024-03-02 MED ORDER — EPHEDRINE 5 MG/ML INJ
INTRAVENOUS | Status: AC
  Filled 2024-03-02: qty 5

## 2024-03-02 MED ORDER — CEFAZOLIN SODIUM-DEXTROSE 2-4 GM/100ML-% IV SOLN
2.0000 g | Freq: Four times a day (QID) | INTRAVENOUS | Status: AC
  Administered 2024-03-02 (×2): 2 g via INTRAVENOUS
  Filled 2024-03-02 (×2): qty 100

## 2024-03-02 MED ORDER — DEXAMETHASONE SODIUM PHOSPHATE 10 MG/ML IJ SOLN
INTRAMUSCULAR | Status: AC
  Filled 2024-03-02: qty 1

## 2024-03-02 MED ORDER — GABAPENTIN 300 MG PO CAPS
ORAL_CAPSULE | ORAL | Status: AC
  Filled 2024-03-02: qty 1

## 2024-03-02 MED ORDER — GABAPENTIN 100 MG PO CAPS
300.0000 mg | ORAL_CAPSULE | Freq: Two times a day (BID) | ORAL | Status: DC
Start: 2024-03-02 — End: 2024-03-03
  Administered 2024-03-02 – 2024-03-03 (×2): 300 mg via ORAL
  Filled 2024-03-02 (×2): qty 3

## 2024-03-02 MED ORDER — LACTATED RINGERS IV SOLN: INTRAVENOUS | Status: DC

## 2024-03-02 MED ORDER — CEFAZOLIN SODIUM-DEXTROSE 2-4 GM/100ML-% IV SOLN
INTRAVENOUS | Status: AC
  Filled 2024-03-02: qty 100

## 2024-03-02 MED ORDER — SODIUM CHLORIDE 0.9 % IR SOLN
Status: DC | PRN
  Administered 2024-03-02: 3000 mL

## 2024-03-02 MED ORDER — ALBUTEROL SULFATE HFA 108 (90 BASE) MCG/ACT IN AERS: 2.0000 | INHALATION_SPRAY | RESPIRATORY_TRACT | Status: DC | PRN

## 2024-03-02 MED ORDER — MIDAZOLAM HCL 2 MG/2ML IJ SOLN
INTRAMUSCULAR | Status: DC | PRN
  Administered 2024-03-02: 1 mg via INTRAVENOUS

## 2024-03-02 MED ORDER — AMLODIPINE BESYLATE 10 MG PO TABS: 10.0000 mg | ORAL_TABLET | Freq: Every day | ORAL | Status: DC

## 2024-03-02 MED ORDER — CLOPIDOGREL BISULFATE 75 MG PO TABS
75.0000 mg | ORAL_TABLET | Freq: Every day | ORAL | Status: DC
  Administered 2024-03-03: 75 mg via ORAL
  Filled 2024-03-02: qty 1

## 2024-03-02 MED ORDER — TRANEXAMIC ACID-NACL 1000-0.7 MG/100ML-% IV SOLN
1000.0000 mg | Freq: Once | INTRAVENOUS | Status: AC
  Administered 2024-03-02: 1000 mg via INTRAVENOUS

## 2024-03-02 MED ORDER — DEXAMETHASONE SODIUM PHOSPHATE 10 MG/ML IJ SOLN
8.0000 mg | Freq: Once | INTRAMUSCULAR | Status: AC
  Administered 2024-03-02: 8 mg via INTRAVENOUS

## 2024-03-02 MED ORDER — PHENOL 1.4 % MT LIQD: 1.0000 | OROMUCOSAL | Status: DC | PRN

## 2024-03-02 MED ORDER — ASPIRIN 81 MG PO CHEW
81.0000 mg | CHEWABLE_TABLET | Freq: Two times a day (BID) | ORAL | Status: DC
  Administered 2024-03-02 – 2024-03-03 (×2): 81 mg via ORAL
  Filled 2024-03-02 (×2): qty 1

## 2024-03-02 MED ORDER — ONDANSETRON HCL 4 MG/2ML IJ SOLN
INTRAMUSCULAR | Status: AC
  Filled 2024-03-02: qty 2

## 2024-03-02 MED ORDER — ALBUTEROL SULFATE (2.5 MG/3ML) 0.083% IN NEBU: 2.5000 mg | INHALATION_SOLUTION | RESPIRATORY_TRACT | Status: DC | PRN

## 2024-03-02 MED ORDER — SURGIPHOR WOUND IRRIGATION SYSTEM - OPTIME: TOPICAL | Status: DC | PRN

## 2024-03-02 MED ORDER — PROPOFOL 10 MG/ML IV BOLUS
INTRAVENOUS | Status: AC
  Filled 2024-03-02: qty 20

## 2024-03-02 MED ORDER — MIDAZOLAM HCL 2 MG/2ML IJ SOLN
INTRAMUSCULAR | Status: AC
  Filled 2024-03-02: qty 2

## 2024-03-02 MED ORDER — EPHEDRINE SULFATE-NACL 50-0.9 MG/10ML-% IV SOSY
PREFILLED_SYRINGE | INTRAVENOUS | Status: DC | PRN
Start: 2024-03-02 — End: 2024-03-02
  Administered 2024-03-02: 5 mg via INTRAVENOUS

## 2024-03-02 MED ORDER — ACETAMINOPHEN 10 MG/ML IV SOLN
INTRAVENOUS | Status: AC
  Filled 2024-03-02: qty 100

## 2024-03-02 MED ORDER — BISACODYL 10 MG RE SUPP: 10.0000 mg | Freq: Every day | RECTAL | Status: DC | PRN

## 2024-03-02 MED ORDER — METOCLOPRAMIDE HCL 10 MG PO TABS
10.0000 mg | ORAL_TABLET | Freq: Three times a day (TID) | ORAL | Status: DC
  Administered 2024-03-02 – 2024-03-03 (×3): 10 mg via ORAL
  Filled 2024-03-02 (×3): qty 1

## 2024-03-02 MED ORDER — ENSURE PRE-SURGERY PO LIQD
296.0000 mL | Freq: Once | ORAL | Status: AC
  Administered 2024-03-02: 296 mL via ORAL

## 2024-03-02 MED ORDER — CHLORHEXIDINE GLUCONATE 0.12 % MT SOLN
15.0000 mL | Freq: Once | OROMUCOSAL | Status: AC
  Administered 2024-03-02: 15 mL via OROMUCOSAL

## 2024-03-02 MED ORDER — ALUM & MAG HYDROXIDE-SIMETH 200-200-20 MG/5ML PO SUSP: 30.0000 mL | ORAL | Status: DC | PRN

## 2024-03-02 SURGICAL SUPPLY — 68 items
ATTUNE MED DOME PAT 32 KNEE (Knees) IMPLANT
ATTUNE PSFEM LTSZ4 NARCEM KNEE (Femur) IMPLANT
ATTUNE PSRP INSR SZ4 6 KNEE (Insert) IMPLANT
BASE TIBIAL ROT PLAT SZ 3 KNEE (Knees) IMPLANT
BATTERY INSTRU NAVIGATION (MISCELLANEOUS) ×4 IMPLANT
BIT DRILL QUICK REL 1/8 2PK SL (BIT) ×1 IMPLANT
BLADE CLIPPER SURG (BLADE) IMPLANT
BLADE SAW 70X12.5 (BLADE) ×1 IMPLANT
BLADE SAW 90X13X1.19 OSCILLAT (BLADE) ×1 IMPLANT
BLADE SAW 90X25X1.19 OSCILLAT (BLADE) ×1 IMPLANT
BONE CEMENT GENTAMICIN (Cement) ×2 IMPLANT
BRUSH SCRUB EZ PLAIN DRY (MISCELLANEOUS) ×1 IMPLANT
CEMENT BONE GENTAMICIN 40 (Cement) IMPLANT
COOLER POLAR GLACIER W/PUMP (MISCELLANEOUS) ×1 IMPLANT
CUFF TOURN SGL QUICK 18X4 (TOURNIQUET CUFF) IMPLANT
CUFF TRNQT CYL 24X4X16.5-23 (TOURNIQUET CUFF) IMPLANT
CUFF TRNQT CYL 30X4X21-28X (TOURNIQUET CUFF) IMPLANT
DRAPE SHEET LG 3/4 BI-LAMINATE (DRAPES) ×1 IMPLANT
DRSG AQUACEL AG ADV 3.5X14 (GAUZE/BANDAGES/DRESSINGS) ×1 IMPLANT
DRSG MEPILEX SACRM 8.7X9.8 (GAUZE/BANDAGES/DRESSINGS) ×1 IMPLANT
DRSG TEGADERM 4X4.75 (GAUZE/BANDAGES/DRESSINGS) ×1 IMPLANT
DURAPREP 26ML APPLICATOR (WOUND CARE) ×2 IMPLANT
ELECT CAUTERY BLADE 6.4 (BLADE) ×1 IMPLANT
ELECT REM PT RETURN 9FT ADLT (ELECTROSURGICAL) ×1 IMPLANT
ELECTRODE REM PT RTRN 9FT ADLT (ELECTROSURGICAL) ×1 IMPLANT
EVACUATOR 1/8 PVC DRAIN (DRAIN) ×1 IMPLANT
EX-PIN ORTHOLOCK NAV 4X150 (PIN) ×2 IMPLANT
GAUZE XEROFORM 1X8 LF (GAUZE/BANDAGES/DRESSINGS) ×1 IMPLANT
GLOVE BIOGEL M STRL SZ7.5 (GLOVE) ×6 IMPLANT
GLOVE SURG UNDER POLY LF SZ8 (GLOVE) ×2 IMPLANT
GOWN STRL REUS W/ TWL LRG LVL3 (GOWN DISPOSABLE) ×1 IMPLANT
GOWN STRL REUS W/ TWL XL LVL3 (GOWN DISPOSABLE) ×1 IMPLANT
GOWN TOGA ZIPPER T7+ PEEL AWAY (MISCELLANEOUS) ×1 IMPLANT
HOLDER FOLEY CATH W/STRAP (MISCELLANEOUS) ×1 IMPLANT
HOOD PEEL AWAY T7 (MISCELLANEOUS) ×1 IMPLANT
KIT TURNOVER KIT A (KITS) ×1 IMPLANT
KNIFE SCULPS 14X20 (INSTRUMENTS) ×1 IMPLANT
MANIFOLD NEPTUNE II (INSTRUMENTS) ×2 IMPLANT
NDL SPNL 20GX3.5 QUINCKE YW (NEEDLE) ×2 IMPLANT
NEEDLE SPNL 20GX3.5 QUINCKE YW (NEEDLE) ×2 IMPLANT
PACK TOTAL KNEE (MISCELLANEOUS) ×1 IMPLANT
PAD ABD DERMACEA PRESS 5X9 (GAUZE/BANDAGES/DRESSINGS) ×2 IMPLANT
PAD ARMBOARD POSITIONER FOAM (MISCELLANEOUS) ×3 IMPLANT
PAD WRAPON POLAR KNEE (MISCELLANEOUS) ×1 IMPLANT
PENCIL SMOKE EVACUATOR COATED (MISCELLANEOUS) ×1 IMPLANT
PIN DRILL FIX HALF THREAD (BIT) ×2 IMPLANT
PIN FIXATION 1/8DIA X 3INL (PIN) ×1 IMPLANT
PULSAVAC PLUS IRRIG FAN TIP (DISPOSABLE) ×1 IMPLANT
SOL .9 NS 3000ML IRR UROMATIC (IV SOLUTION) ×1 IMPLANT
SOLUTION IRRIG SURGIPHOR (IV SOLUTION) ×1 IMPLANT
SPONGE DRAIN TRACH 4X4 STRL 2S (GAUZE/BANDAGES/DRESSINGS) ×1 IMPLANT
STAPLER SKIN PROX 35W (STAPLE) ×1 IMPLANT
STOCKINETTE IMPERV 14X48 (MISCELLANEOUS) ×1 IMPLANT
STOCKINETTE STRL BIAS CUT 8X4 (MISCELLANEOUS) ×1 IMPLANT
STRAP TIBIA SHORT (MISCELLANEOUS) ×1 IMPLANT
SUCTION TUBE FRAZIER 10FR DISP (SUCTIONS) ×1 IMPLANT
SUT VIC AB 0 CT1 36 (SUTURE) ×1 IMPLANT
SUT VIC AB 1 CT1 36 (SUTURE) ×2 IMPLANT
SUT VIC AB 2-0 CT2 27 (SUTURE) ×1 IMPLANT
SYR 30ML LL (SYRINGE) ×2 IMPLANT
TIBIAL BASE ROT PLAT SZ 3 KNEE (Knees) ×1 IMPLANT
TIP FAN IRRIG PULSAVAC PLUS (DISPOSABLE) ×1 IMPLANT
TOWEL OR 17X26 4PK STRL BLUE (TOWEL DISPOSABLE) ×1 IMPLANT
TOWER CARTRIDGE SMART MIX (DISPOSABLE) ×1 IMPLANT
TRAP FLUID SMOKE EVACUATOR (MISCELLANEOUS) ×1 IMPLANT
TRAY FOLEY MTR SLVR 16FR STAT (SET/KITS/TRAYS/PACK) ×1 IMPLANT
WATER STERILE IRR 1000ML POUR (IV SOLUTION) ×1 IMPLANT
WRAPON POLAR PAD KNEE (MISCELLANEOUS) ×1 IMPLANT

## 2024-03-02 NOTE — Interval H&P Note (Signed)
 History and Physical Interval Note:  03/02/2024 11:33 AM  [name]  has presented today for surgery, with the diagnosis of PRIMARY OSTEOARTHRITIS OF LEFT KNEE..  The various methods of treatment have been discussed with the patient and family. After consideration of risks, benefits and other options for treatment, the patient has consented to  Procedure(s): ARTHROPLASTY, KNEE, TOTAL, USING IMAGELESS COMPUTER-ASSISTED NAVIGATION (Left) as a surgical intervention.  The patient's history has been reviewed, patient examined, no change in status, stable for surgery.  I have reviewed the patient's chart and labs.  Questions were answered to the patient's satisfaction.     [name] P Sarissa Dern

## 2024-03-02 NOTE — Progress Notes (Signed)
 Patient is not able to walk the distance required to go the bathroom, or he/she is unable to safely negotiate stairs required to access the bathroom.  A 3in1 BSC will alleviate this problem   Amenda Duclos P. Angie Fava M.D.

## 2024-03-02 NOTE — Progress Notes (Signed)
 Subjective: 1 Day Post-Op Procedure(s) (LRB): ARTHROPLASTY, KNEE, TOTAL, USING IMAGELESS COMPUTER-ASSISTED NAVIGATION (Left) Patient reports pain as mild.   Patient seen in rounds with Dr. Ernest Pine. Patient is well, and has had no acute complaints or problems. Denies any CP, SOB, N/V, fevers or chills We will start therapy today.  Plan is to go Home after hospital stay.  Objective: Vital signs in last 24 hours: Temp:  [97 F (36.1 C)-98.7 F (37.1 C)] 98 F (36.7 C) (03/27 0557) Pulse Rate:  [66-79] 68 (03/27 0557) Resp:  [12-20] 14 (03/27 0557) BP: (87-129)/(49-63) 102/62 (03/27 0557) SpO2:  [92 %-100 %] 97 % (03/27 0557) Weight:  [45.4 kg] 45.4 kg (03/26 1005)  Intake/Output from previous day:  Intake/Output Summary (Last 24 hours) at 03/03/2024 0811 Last data filed at 03/03/2024 0546 Gross per 24 hour  Intake 2371.05 ml  Output 1450 ml  Net 921.05 ml    Intake/Output this shift: No intake/output data recorded.  Labs: No results for input(s): "HGB" in the last 72 hours. No results for input(s): "WBC", "RBC", "HCT", "PLT" in the last 72 hours. No results for input(s): "NA", "K", "CL", "CO2", "BUN", "CREATININE", "GLUCOSE", "CALCIUM" in the last 72 hours. No results for input(s): "LABPT", "INR" in the last 72 hours.  EXAM General - Patient is Alert, Appropriate, and Oriented Extremity - Neurologically intact Neurovascular intact Sensation intact distally Intact pulses distally Dorsiflexion/Plantar flexion intact No cellulitis present Compartment soft Dressing - dressing C/D/I and no drainage Motor Function - intact, moving foot and toes well on exam. JP Drain pulled without difficulty. Intact  Past Medical History:  Diagnosis Date   Anemia    Arthritis    Asthma    uses inhaler just prior to surgery to avoid attack   Back pain    from previous injury   Cerebral microvascular disease    Chronic diastolic CHF (congestive heart failure) (HCC)    Complication of  anesthesia    has woken  up during 2 different surgery   Depression    Gallstones    GERD (gastroesophageal reflux disease)    Hiatal hernia    History of kidney stones    HLD (hyperlipidemia)    HTN (hypertension)    Hypothyroidism    Iron deficiency anemia 12/2023   Kidney stones    Knee pain    Nausea and vomiting 10/15/2022   Non-diabetic pancreatic hormone dysfunction years   pt. states pancreas does not function properly   Ovarian cyst    Pancreatitis    Pneumonia, aspiration (HCC)    PONV (postoperative nausea and vomiting)    Seizures (HCC)    caused by dye injected during a procedure   Shortness of breath on exertion    Sinus problem    frequent infections/congestion   Stroke (HCC) 01/24/2020   a.) MRI brain 01/24/2020: small subacute infarct in the RIGHT frontal white matter   Stroke (HCC) 08/19/2022   a.) MRI brain 08/19/2022: acute infarcts in the RIGHT carona radiata and RIGHT anterior thalamus; chronic infarcts in the basal ganglia BILATERALLY   Thrombocytosis 12/2023   Vertebral artery stenosis, right    a.) MRI 12/03/2022: complete occlusion of the distal V2, V3, and V4 segments    Assessment/Plan: 1 Day Post-Op Procedure(s) (LRB): ARTHROPLASTY, KNEE, TOTAL, USING IMAGELESS COMPUTER-ASSISTED NAVIGATION (Left) Principal Problem:   History of total knee arthroplasty, left  Estimated body mass index is 20.2 kg/m as calculated from the following:   Height as of this encounter:  4\' 11"  (1.499 m).   Weight as of this encounter: 45.4 kg. Advance diet Up with therapy  Patient will continue to work with physical therapy to pass postoperative PT protocols, ROM and strengthening  Discussed with the patient continuing to utilize Polar Care  Patient will use bone foam in 20-30 minute intervals  Patient will wear TED hose bilaterally to help prevent DVT and clot formation  Discussed the Aquacel bandage.  This bandage will stay in place 7 days postoperatively.   Can be replaced with honeycomb bandages that will be sent home with the patient  Discussed sending the patient home with tramadol and oxycodone for as needed pain management.  Patient will also be sent home with Celebrex to help with swelling and inflammation.  Patient will take an 81 mg aspirin twice along with her at home Plavix daily for DVT prophylaxis  JP drain removed without difficulty, intact  Weight-Bearing as tolerated to left leg  Patient will follow-up with [facility] in 2 weeks for staple removal and reevaluation  Rayburn Go, PA-C [facility] 03/03/2024, 8:11 AM

## 2024-03-02 NOTE — Plan of Care (Signed)
  Problem: Safety: Goal: Ability to remain free from injury will improve Outcome: Progressing   Problem: Pain Management: Goal: Pain level will decrease with appropriate interventions Outcome: Progressing

## 2024-03-02 NOTE — Transfer of Care (Signed)
 Immediate Anesthesia Transfer of Care Note  Patient: [name]  Procedure(s) Performed: ARTHROPLASTY, KNEE, TOTAL, USING IMAGELESS COMPUTER-ASSISTED NAVIGATION (Left: Knee)  Patient Location: PACU  Anesthesia Type:General  Level of Consciousness: awake, alert , oriented, and patient cooperative  Airway & Oxygen Therapy: Patient Spontanous Breathing and Patient connected to face mask oxygen  Post-op Assessment: Report given to RN and Post -op Vital signs reviewed and stable  Post vital signs: Reviewed and stable  Last Vitals:  Vitals Value Taken Time  BP 105/50 03/02/24 1530  Temp 36.9 C 03/02/24 1530  Pulse 75 03/02/24 1534  Resp 15 03/02/24 1534  SpO2 100 % 03/02/24 1534  Vitals shown include unfiled device data.  Last Pain:  Vitals:   03/02/24 1530  TempSrc:   PainSc: Asleep         Complications: No notable events documented.

## 2024-03-02 NOTE — Progress Notes (Signed)
 PT Cancellation Note  Patient Details Name: [name] MRN: [id number] DOB: 01/25/1941   Cancelled Treatment:    Reason Eval/Treat Not Completed: Fatigue/lethargy limiting ability to participate Spoke with PACU nurse ~16:30, reports she had general anesthesia and is still very lethargic.  Not appropriate for PT at this time.  Will plan to initiate PT eval tomorrow AM.  Malachi Pro, DPT 03/02/2024, 5:13 PM

## 2024-03-02 NOTE — Anesthesia Preprocedure Evaluation (Addendum)
 Anesthesia Evaluation  Patient identified by MRN, date of birth, ID band Patient awake    Reviewed: Allergy & Precautions, NPO status , Patient's Chart, lab work & pertinent test results  History of Anesthesia Complications (+) PONV, AWARENESS UNDER ANESTHESIA and history of anesthetic complications  Airway Mallampati: III  TM Distance: <3 FB Neck ROM: full    Dental  (+) Chipped   Pulmonary neg shortness of breath, asthma    Pulmonary exam normal        Cardiovascular Exercise Tolerance: Good hypertension, (-) angina +CHF  Normal cardiovascular exam     Neuro/Psych Seizures -,   Neuromuscular disease CVA  negative psych ROS   GI/Hepatic Neg liver ROS, hiatal hernia,GERD  Controlled,,  Endo/Other  Hypothyroidism    Renal/GU CRFRenal disease     Musculoskeletal   Abdominal   Peds  Hematology negative hematology ROS (+)   Anesthesia Other Findings Past Medical History: No date: Anemia No date: Arthritis No date: Asthma     Comment:  uses inhaler just prior to surgery to avoid attack No date: Back pain     Comment:  from previous injury No date: Cerebral microvascular disease No date: Chronic diastolic CHF (congestive heart failure) (HCC) No date: Complication of anesthesia     Comment:  has woken  up during 2 different surgery No date: Depression No date: Gallstones No date: GERD (gastroesophageal reflux disease) No date: Hiatal hernia No date: History of kidney stones No date: HLD (hyperlipidemia) No date: HTN (hypertension) No date: Hypothyroidism 12/2023: Iron deficiency anemia No date: Kidney stones No date: Knee pain 10/15/2022: Nausea and vomiting years: Non-diabetic pancreatic hormone dysfunction     Comment:  pt. states pancreas does not function properly No date: Ovarian cyst No date: Pancreatitis No date: Pneumonia, aspiration (HCC) No date: PONV (postoperative nausea and vomiting) No  date: Seizures (HCC)     Comment:  caused by dye injected during a procedure No date: Shortness of breath on exertion No date: Sinus problem     Comment:  frequent infections/congestion 01/24/2020: Stroke (HCC)     Comment:  a.) MRI brain 01/24/2020: small subacute infarct in the               RIGHT frontal white matter 08/19/2022: Stroke (HCC)     Comment:  a.) MRI brain 08/19/2022: acute infarcts in the RIGHT               carona radiata and RIGHT anterior thalamus; chronic               infarcts in the basal ganglia BILATERALLY 12/2023: Thrombocytosis No date: Vertebral artery stenosis, right     Comment:  a.) MRI 12/03/2022: complete occlusion of the distal V2,              V3, and V4 segments  Past Surgical History: 1965: APPENDECTOMY 1965: BILATERAL SALPINGOOPHORECTOMY No date: CARPAL TUNNEL RELEASE; Bilateral No date: CATARACT EXTRACTION W/ INTRAOCULAR LENS  IMPLANT, BILATERAL;  Bilateral 6 weeks ago: FOOT OSTEOTOMY     Comment:  Left foot: great, 2nd & 3rd 5 years ago: FOOT OSTEOTOMY     Comment:  Right great toe 2011-most recent: HAND SURGERY; Bilateral     Comment:  multiple hand surgeries, 2 on left, 3 on right 04/28/2022: KNEE ARTHROPLASTY; Right     Comment:  Procedure: COMPUTER ASSISTED TOTAL KNEE ARTHROPLASTY;                Surgeon: Francesco Sor  P, MD;  Location: [facility] OR[facility];                Service: Orthopedics;  Laterality: Right; 05/16/2020: LOOP RECORDER INSERTION; N/A     Comment:  Procedure: LOOP RECORDER INSERTION;  Surgeon: Marcina Millard, MD;  Location: [facility];  Service:              Cardiovascular;  Laterality: N/A; most recent 7-8 yrs ago: NASAL SINUS SURGERY     Comment:  7 sinus surgeries  1963: PARTIAL HYSTERECTOMY 11/19/2011: TRIGGER FINGER RELEASE     Comment:  Procedure: RELEASE TRIGGER FINGER/A-1 PULLEY;  Surgeon:               Nicki Reaper, MD;  Location: [facility];                Service:  Orthopedics;  Laterality: Right;  release a-1               pulley right index finger and cyst removal 1980's: WRIST GANGLION EXCISION     Comment:  right  BMI    Body Mass Index: 20.20 kg/m      Reproductive/Obstetrics negative OB ROS                             Anesthesia Physical Anesthesia Plan  ASA: 3  Anesthesia Plan: General ETT   Post-op Pain Management:    Induction: Intravenous  PONV Risk Score and Plan: Ondansetron, Dexamethasone, Midazolam and Treatment may vary due to age or medical condition  Airway Management Planned: Oral ETT  Additional Equipment:   Intra-op Plan:   Post-operative Plan: Extubation in OR  Informed Consent: I have reviewed the patients History and Physical, chart, labs and discussed the procedure including the risks, benefits and alternatives for the proposed anesthesia with the patient or authorized representative who has indicated his/her understanding and acceptance.     Dental Advisory Given  Plan Discussed with: Anesthesiologist, CRNA and Surgeon  Anesthesia Plan Comments: (Patient declines spinal and requests GA  Patient consented for risks of anesthesia including but not limited to:  - adverse reactions to medications - damage to eyes, teeth, lips or other oral mucosa - nerve damage due to positioning  - sore throat or hoarseness - Damage to heart, brain, nerves, lungs, other parts of body or loss of life  Patient voiced understanding and assent.)       Anesthesia Quick Evaluation

## 2024-03-02 NOTE — Anesthesia Procedure Notes (Signed)
 Procedure Name: Intubation Date/Time: 03/02/2024 12:08 PM  Performed by: Rich Brave, CRNAPre-anesthesia Checklist: Patient identified, Emergency Drugs available, Suction available, Patient being monitored and Timeout performed Patient Re-evaluated:Patient Re-evaluated prior to induction Oxygen Delivery Method: Circle system utilized Preoxygenation: Pre-oxygenation with 100% oxygen Induction Type: IV induction Ventilation: Mask ventilation without difficulty Laryngoscope Size: 3 and McGrath Grade View: Grade I Tube type: Oral Tube size: 6.5 mm Number of attempts: 1 Airway Equipment and Method: Stylet and Video-laryngoscopy Placement Confirmation: ETT inserted through vocal cords under direct vision, positive ETCO2 and breath sounds checked- equal and bilateral Secured at: 19 cm Tube secured with: Tape Dental Injury: Teeth and Oropharynx as per pre-operative assessment

## 2024-03-02 NOTE — Discharge Summary (Signed)
 Physician Discharge Summary  Subjective: 1 Day Post-Op Procedure(s) (LRB): ARTHROPLASTY, KNEE, TOTAL, USING IMAGELESS COMPUTER-ASSISTED NAVIGATION (Left) Patient reports pain as mild.   Patient seen in rounds with Dr. Ernest Pine. Patient is well, and has had no acute complaints or problems. Denies any CP, SOB, N/V, fevers or chills We will start therapy today.  Patient is ready to go home  Physician Discharge Summary  Patient ID: [name] MRN: [id number] DOB/AGE: 02/17/1941 83 y.o.  Admit date: 03/02/2024 Discharge date: 03/03/2024  Admission Diagnoses:  Discharge Diagnoses:  Principal Problem:   History of total knee arthroplasty, left   Discharged Condition: good  Hospital Course: Patient presented to the hospital on 03/02/2024 for an elective left total knee arthroplasty performed by Dr. Ernest Pine. Patient was given 1g of TXA and 2g of Ancef prior to the procedure. She  tolerated the procedure well without any complications. See procedural note below for details. Postoperatively, the patient did very well. She  was able to pass PT protocols on post-op day one without any issues. JP drain was removed without any difficulty and was intact. she was able to void her bladder without any difficulty. Physical exam was unremarkable. she denies any SOB, CP, N/V, fevers or chills. Vital signs are stable. Patient is stable to discharge home.  PROCEDURE:  Left total knee arthroplasty using computer-assisted navigation   SURGEON:  Jena Gauss. M.D.   ASSISTANT:  Gean Birchwood, PA-C (present and scrubbed throughout the case, critical for assistance with exposure, retraction, instrumentation, and closure)   ANESTHESIA: general   ESTIMATED BLOOD LOSS: 50 mL   FLUIDS REPLACED: 1300 mL of crystalloid   TOURNIQUET TIME: 90 minutes   DRAINS: 2 medium Hemovac drains   SOFT TISSUE RELEASES: Anterior cruciate ligament, posterior cruciate ligament, deep medial collateral ligament,  patellofemoral ligament   IMPLANTS UTILIZED: DePuy Attune size 4N posterior stabilized femoral component (cemented), size 3 rotating platform tibial component (cemented), 32 mm medialized dome patella (cemented), and a 6 mm stabilized rotating platform polyethylene insert.    Treatments: none  Discharge Exam: Blood pressure 102/62, pulse 68, temperature 98 F (36.7 C), temperature source Oral, resp. rate 14, height 4\' 11"  (1.499 m), weight 45.4 kg, SpO2 97%.   Disposition: home   Allergies as of 03/03/2024       Reactions   Cinobac [cinoxacin] Anaphylaxis   "My throat swelled and I stopped breathing."   Latex Dermatitis   IgE = 14 (WNL) on 04/17/2022 Pt is highly reactive to any latex products. Blisters skin. "Takes my skin off" "Skin will tears off"  IgE = 14 (WNL) on 04/17/2022  Pt is highly reactive to any latex products. Blisters skin. "Takes my skin off"   Other Shortness Of Breath   Muscle relaxers? Pt thinks allergic to muscle relaxers, reports stops breathing but not sure what medicine and if for sure muscle relaxer Petroleum, Chemicals, Paints, Muscle Relaxers   Petrolatum Distillates [petroleum Distillate] Shortness Of Breath   Passes out    Sulfa Antibiotics Itching   Meloxicam    Other Reaction(s): Not available, Unknown   Misc. Sulfonamide Containing Compounds Other (See Comments)   Nylon Itching, Rash        Medication List     TAKE these medications    acetaminophen 325 MG tablet Commonly known as: TYLENOL Take 2 tablets (650 mg total) by mouth every 4 (four) hours as needed for mild pain (or temp > 37.5 C (99.5 F)).   albuterol 108 (90  Base) MCG/ACT inhaler Commonly known as: VENTOLIN HFA Inhale 2 puffs into the lungs every 4 (four) hours as needed for wheezing or shortness of breath.   amLODipine 10 MG tablet Commonly known as: NORVASC Take 10 mg by mouth daily.   aspirin EC 81 MG tablet Take 1 tablet (81 mg total) by mouth 2 (two) times daily.  Swallow whole. What changed: when to take this   atorvastatin 40 MG tablet Commonly known as: LIPITOR Take 1 tablet (40 mg total) by mouth daily.   celecoxib 200 MG capsule Commonly known as: CELEBREX Take 1 capsule (200 mg total) by mouth 2 (two) times daily.   clopidogrel 75 MG tablet Commonly known as: PLAVIX Take 1 tablet (75 mg total) by mouth daily.   diclofenac Sodium 1 % Gel Commonly known as: VOLTAREN Apply 2 g topically 4 (four) times daily. What changed:  when to take this reasons to take this   folic acid 800 MCG tablet Commonly known as: FOLVITE Take 1 tablet (800 mcg total) by mouth daily. What changed: how much to take   gabapentin 300 MG capsule Commonly known as: NEURONTIN Take 1 capsule (300 mg total) by mouth 2 (two) times daily.   IRON PO Take 1 tablet by mouth daily. 9 mg gummy   Klor-Con M10 10 MEQ tablet Generic drug: potassium chloride Take 20 mEq by mouth at bedtime.   levothyroxine 50 MCG tablet Commonly known as: SYNTHROID Take 50 mcg by mouth daily before breakfast.   Magnesium 250 MG Tabs Take 250 mg by mouth daily.   montelukast 10 MG tablet Commonly known as: SINGULAIR Take 10 mg by mouth at bedtime.   Multivitamin Gummies Adult Chew Chew 2 capsules by mouth daily.   HAIR SKIN & NAILS ADVANCED PO Take 1 tablet by mouth daily.   oxyCODONE 5 MG immediate release tablet Commonly known as: Oxy IR/ROXICODONE Take 1 tablet (5 mg total) by mouth every 4 (four) hours as needed for moderate pain (pain score 4-6) (pain score 4-6).   pantoprazole 40 MG tablet Commonly known as: PROTONIX TAKE 1 TABLET BY MOUTH EVERY DAY   propranolol ER 60 MG 24 hr capsule Commonly known as: INDERAL LA Take 1 capsule (60 mg total) by mouth daily.   traMADol 50 MG tablet Commonly known as: ULTRAM Take 1-2 tablets (50-100 mg total) by mouth every 4 (four) hours as needed for moderate pain (pain score 4-6).   Vitamin D3 125 MCG (5000 UT)  Caps Take 1 capsule by mouth daily.               Durable Medical Equipment  (From admission, onward)           Start     Ordered   03/02/24 1634  DME Walker rolling  Once       Question:  Patient needs a walker to treat with the following condition  Answer:  Total knee replacement status   03/02/24 1633   03/02/24 1634  DME Bedside commode  Once       Comments: Patient is not able to walk the distance required to go the bathroom, or he/she is unable to safely negotiate stairs required to access the bathroom.  A 3in1 BSC will alleviate this problem  Question:  Patient needs a bedside commode to treat with the following condition  Answer:  Total knee replacement status   03/02/24 1633            Follow-up Information  Rayburn Go, PA-C Follow up on 03/15/2024.   Specialty: Orthopedic Surgery Why: at 9:30am Contact information: [address] [place] [address] [phone]         Donato Heinz, MD Follow up on 04/14/2024.   Specialty: Orthopedic Surgery Why: at 2:45pm Contact information: [address] [facility] [place] [address] [phone]                 Signed: Gean Birchwood 03/03/2024, 8:12 AM   Objective: Vital signs in last 24 hours: Temp:  [97 F (36.1 C)-98.7 F (37.1 C)] 98 F (36.7 C) (03/27 0557) Pulse Rate:  [66-79] 68 (03/27 0557) Resp:  [12-20] 14 (03/27 0557) BP: (87-129)/(49-63) 102/62 (03/27 0557) SpO2:  [92 %-100 %] 97 % (03/27 0557) Weight:  [45.4 kg] 45.4 kg (03/26 1005)  Intake/Output from previous day:  Intake/Output Summary (Last 24 hours) at 03/03/2024 0812 Last data filed at 03/03/2024 0546 Gross per 24 hour  Intake 2371.05 ml  Output 1450 ml  Net 921.05 ml    Intake/Output this shift: No intake/output data recorded.  Labs: No results for input(s): "HGB" in the last 72 hours. No results for input(s): "WBC", "RBC", "HCT", "PLT" in the last 72 hours. No results for  input(s): "NA", "K", "CL", "CO2", "BUN", "CREATININE", "GLUCOSE", "CALCIUM" in the last 72 hours. No results for input(s): "LABPT", "INR" in the last 72 hours.  EXAM: General - Patient is Alert, Appropriate, and Oriented Extremity - Neurologically intact Neurovascular intact Sensation intact distally Intact pulses distally Dorsiflexion/Plantar flexion intact No cellulitis present Compartment soft Dressing - dressing C/D/I and no drainage Motor Function - intact, moving foot and toes well on exam. JP Drain pulled without difficulty. Intact  Assessment/Plan: 1 Day Post-Op Procedure(s) (LRB): ARTHROPLASTY, KNEE, TOTAL, USING IMAGELESS COMPUTER-ASSISTED NAVIGATION (Left) Procedure(s) (LRB): ARTHROPLASTY, KNEE, TOTAL, USING IMAGELESS COMPUTER-ASSISTED NAVIGATION (Left) Past Medical History:  Diagnosis Date   Anemia    Arthritis    Asthma    uses inhaler just prior to surgery to avoid attack   Back pain    from previous injury   Cerebral microvascular disease    Chronic diastolic CHF (congestive heart failure) (HCC)    Complication of anesthesia    has woken  up during 2 different surgery   Depression    Gallstones    GERD (gastroesophageal reflux disease)    Hiatal hernia    History of kidney stones    HLD (hyperlipidemia)    HTN (hypertension)    Hypothyroidism    Iron deficiency anemia 12/2023   Kidney stones    Knee pain    Nausea and vomiting 10/15/2022   Non-diabetic pancreatic hormone dysfunction years   pt. states pancreas does not function properly   Ovarian cyst    Pancreatitis    Pneumonia, aspiration (HCC)    PONV (postoperative nausea and vomiting)    Seizures (HCC)    caused by dye injected during a procedure   Shortness of breath on exertion    Sinus problem    frequent infections/congestion   Stroke (HCC) 01/24/2020   a.) MRI brain 01/24/2020: small subacute infarct in the RIGHT frontal white matter   Stroke (HCC) 08/19/2022   a.) MRI brain  08/19/2022: acute infarcts in the RIGHT carona radiata and RIGHT anterior thalamus; chronic infarcts in the basal ganglia BILATERALLY   Thrombocytosis 12/2023   Vertebral artery stenosis, right    a.) MRI 12/03/2022: complete occlusion of the distal V2, V3, and  V4 segments   Principal Problem:   History of total knee arthroplasty, left  Estimated body mass index is 20.2 kg/m as calculated from the following:   Height as of this encounter: 4\' 11"  (1.499 m).   Weight as of this encounter: 45.4 kg.  Patient will continue to work with physical therapy on gait, ROM and strengthening   Discussed with the patient continuing to utilize Polar Care   Patient will use bone foam in 20-30 minute intervals   Patient will wear TED hose bilaterally to help prevent DVT and clot formation   Discussed the Aquacel bandage.  This bandage will stay in place 7 days postoperatively.  Can be replaced with honeycomb bandages that will be sent home with the patient   Discussed sending the patient home with tramadol and oxycodone for as needed pain management.  Patient will also be sent home with Celebrex to help with swelling and inflammation.  Patient will take an 81 mg aspirin twice daily and at home Plavix for DVT prophylaxis   JP drain removed without difficulty, intact   Weight-Bearing as tolerated to left leg   Patient will follow-up with [facility] orthopedics in 2 weeks for staple removal and reevaluation  Diet - Regular diet Follow up - in 2 weeks Activity - WBAT Disposition - Home Condition Upon Discharge - Good DVT Prophylaxis - Aspirin and TED hose  Danise Edge, PA-C Orthopaedic Surgery 03/03/2024, 8:12 AM

## 2024-03-02 NOTE — Op Note (Signed)
 OPERATIVE NOTE  DATE OF SURGERY:  03/02/2024  PATIENT NAME:  Leanne Sisler Harbor   DOB: 23-Oct-1941  MRN: [id number]  PRE-OPERATIVE DIAGNOSIS: Degenerative arthrosis of the left knee, primary  POST-OPERATIVE DIAGNOSIS:  Same  PROCEDURE:  Left total knee arthroplasty using computer-assisted navigation  SURGEON:  Jena Gauss. M.D.  ASSISTANT:  Gean Birchwood, PA-C (present and scrubbed throughout the case, critical for assistance with exposure, retraction, instrumentation, and closure)  ANESTHESIA: general  ESTIMATED BLOOD LOSS: 50 mL  FLUIDS REPLACED: 1300 mL of crystalloid  TOURNIQUET TIME: 90 minutes  DRAINS: 2 medium Hemovac drains  SOFT TISSUE RELEASES: Anterior cruciate ligament, posterior cruciate ligament, deep medial collateral ligament, patellofemoral ligament  IMPLANTS UTILIZED: DePuy Attune size 4N posterior stabilized femoral component (cemented), size 3 rotating platform tibial component (cemented), 32 mm medialized dome patella (cemented), and a 6 mm stabilized rotating platform polyethylene insert.  INDICATIONS FOR SURGERY: Tomia Enlow Folta is a 83 y.o. year old female with a long history of progressive knee pain. X-rays demonstrated severe degenerative changes in tricompartmental fashion. The patient had not seen any significant improvement despite conservative nonsurgical intervention. After discussion of the risks and benefits of surgical intervention, the patient expressed understanding of the risks benefits and agree with plans for total knee arthroplasty.   The risks, benefits, and alternatives were discussed at length including but not limited to the risks of infection, bleeding, nerve injury, stiffness, blood clots, the need for revision surgery, cardiopulmonary complications, among others, and they were willing to proceed.  PROCEDURE IN DETAIL: The patient was brought into the operating room and, after adequate general anesthesia was achieved, a tourniquet was placed on  the patient's upper thigh. The patient's knee and leg were cleaned and prepped with alcohol and DuraPrep and draped in the usual sterile fashion. A "timeout" was performed as per usual protocol. The lower extremity was exsanguinated using an Esmarch, and the tourniquet was inflated to 300 mmHg. An anterior longitudinal incision was made followed by a standard mid vastus approach. The deep fibers of the medial collateral ligament were elevated in a subperiosteal fashion off of the medial flare of the tibia so as to maintain a continuous soft tissue sleeve. The patella was subluxed laterally and the patellofemoral ligament was incised. Inspection of the knee demonstrated severe degenerative changes with full-thickness loss of articular cartilage. Osteophytes were debrided using a rongeur. Anterior and posterior cruciate ligaments were excised. Two 4.0 mm Schanz pins were inserted in the femur and into the tibia for attachment of the array of trackers used for computer-assisted navigation. Hip center was identified using a circumduction technique. Distal landmarks were mapped using the computer. The distal femur and proximal tibia were mapped using the computer. The distal femoral cutting guide was positioned using computer-assisted navigation so as to achieve a 5 distal valgus cut. The femur was sized and it was felt that a size 4N femoral component was appropriate. A size 4 femoral cutting guide was positioned and the anterior cut was performed and verified using the computer. This was followed by completion of the posterior and chamfer cuts. Femoral cutting guide for the central box was then positioned in the center box cut was performed.  Attention was then directed to the proximal tibia. Medial and lateral menisci were excised. The extramedullary tibial cutting guide was positioned using computer-assisted navigation so as to achieve a 0 varus-valgus alignment and 3 posterior slope. The cut was performed and  verified using the computer. The proximal tibia  was sized and it was felt that a size 3 tibial tray was appropriate. Tibial and femoral trials were inserted followed by insertion of a 6 mm polyethylene insert. This allowed for excellent mediolateral soft tissue balancing both in flexion and in full extension. Finally, the patella was cut and prepared so as to accommodate a 32 mm medialized dome patella. A patella trial was placed and the knee was placed through a range of motion with excellent patellar tracking appreciated. The femoral trial was removed after debridement of posterior osteophytes. The central post-hole for the tibial component was reamed followed by insertion of a keel punch. Tibial trials were then removed. Cut surfaces of bone were irrigated with copious amounts of normal saline using pulsatile lavage and then suctioned dry. Polymethylmethacrylate cement with gentamicin was prepared in the usual fashion using a vacuum mixer. Cement was applied to the cut surface of the proximal tibia as well as along the undersurface of a size 3 rotating platform tibial component. Tibial component was positioned and impacted into place. Excess cement was removed using Personal assistant. Cement was then applied to the cut surfaces of the femur as well as along the posterior flanges of the size 4N femoral component. The femoral component was positioned and impacted into place. Excess cement was removed using Personal assistant. A 6 mm polyethylene trial was inserted and the knee was brought into full extension with steady axial compression applied. Finally, cement was applied to the backside of a 32 mm medialized dome patella and the patellar component was positioned and patellar clamp applied. Excess cement was removed using Personal assistant. After adequate curing of the cement, the tourniquet was deflated after a total tourniquet time of 90 minutes. Hemostasis was achieved using electrocautery. The knee was irrigated  with copious amounts of normal saline using pulsatile lavage followed by 450 ml of Surgiphor and then suctioned dry. 20 mL of 1.3% Exparel and 60 mL of 0.25% Marcaine in 40 mL of normal saline was injected along the posterior capsule, medial and lateral gutters, and along the arthrotomy site. A 6 mm stabilized rotating platform polyethylene insert was inserted and the knee was placed through a range of motion with excellent mediolateral soft tissue balancing appreciated and excellent patellar tracking noted. 2 medium drains were placed in the wound bed and brought out through separate stab incisions. The medial parapatellar portion of the incision was reapproximated using interrupted sutures of #1 Vicryl. Subcutaneous tissue was approximated in layers using first #0 Vicryl followed #2-0 Vicryl. The skin was approximated with skin staples. A sterile dressing was applied.  The patient tolerated the procedure well and was transported to the recovery room in stable condition.    Charlyn Vialpando P. Angie Fava., M.D.

## 2024-03-02 NOTE — Anesthesia Postprocedure Evaluation (Signed)
 Anesthesia Post Note  Patient: [name]  Procedure(s) Performed: ARTHROPLASTY, KNEE, TOTAL, USING IMAGELESS COMPUTER-ASSISTED NAVIGATION (Left: Knee)  Patient location during evaluation: PACU Anesthesia Type: General Level of consciousness: awake and alert Pain management: pain level controlled Vital Signs Assessment: post-procedure vital signs reviewed and stable Respiratory status: spontaneous breathing, nonlabored ventilation, respiratory function stable and patient connected to nasal cannula oxygen Cardiovascular status: blood pressure returned to baseline and stable Postop Assessment: no apparent nausea or vomiting Anesthetic complications: no   No notable events documented.   Last Vitals:  Vitals:   03/02/24 1639 03/02/24 1914  BP: (!) 107/57 (!) 108/57  Pulse: 70 66  Resp:  16  Temp: 36.8 C 36.6 C  SpO2: 92% 99%    Last Pain:  Vitals:   03/02/24 1914  TempSrc: Temporal  PainSc:                  [name]

## 2024-03-03 ENCOUNTER — Encounter: Payer: Self-pay | Admitting: Orthopedic Surgery

## 2024-03-03 DIAGNOSIS — Z79899 Other long term (current) drug therapy: Secondary | ICD-10-CM | POA: Diagnosis not present

## 2024-03-03 DIAGNOSIS — I10 Essential (primary) hypertension: Secondary | ICD-10-CM | POA: Diagnosis not present

## 2024-03-03 DIAGNOSIS — Z96651 Presence of right artificial knee joint: Secondary | ICD-10-CM | POA: Diagnosis not present

## 2024-03-03 DIAGNOSIS — J45909 Unspecified asthma, uncomplicated: Secondary | ICD-10-CM | POA: Diagnosis not present

## 2024-03-03 DIAGNOSIS — I5032 Chronic diastolic (congestive) heart failure: Secondary | ICD-10-CM | POA: Diagnosis not present

## 2024-03-03 DIAGNOSIS — Z7902 Long term (current) use of antithrombotics/antiplatelets: Secondary | ICD-10-CM | POA: Diagnosis not present

## 2024-03-03 DIAGNOSIS — M1712 Unilateral primary osteoarthritis, left knee: Secondary | ICD-10-CM | POA: Diagnosis not present

## 2024-03-03 DIAGNOSIS — Z91040 Latex allergy status: Secondary | ICD-10-CM | POA: Diagnosis not present

## 2024-03-03 DIAGNOSIS — I110 Hypertensive heart disease with heart failure: Secondary | ICD-10-CM | POA: Diagnosis not present

## 2024-03-03 MED ORDER — ACETAMINOPHEN 10 MG/ML IV SOLN
INTRAVENOUS | Status: AC
  Filled 2024-03-03: qty 100

## 2024-03-03 MED ORDER — ASPIRIN 81 MG PO TBEC: 81.0000 mg | DELAYED_RELEASE_TABLET | Freq: Two times a day (BID) | ORAL | Status: AC

## 2024-03-03 MED ORDER — OXYCODONE HCL 5 MG PO TABS
5.0000 mg | ORAL_TABLET | ORAL | 0 refills | Status: AC | PRN
Start: 2024-03-03 — End: ?

## 2024-03-03 MED ORDER — TRAMADOL HCL 50 MG PO TABS: 50.0000 mg | ORAL_TABLET | ORAL | 0 refills | Status: AC | PRN

## 2024-03-03 MED ORDER — CELECOXIB 200 MG PO CAPS: 200.0000 mg | ORAL_CAPSULE | Freq: Two times a day (BID) | ORAL | 1 refills | Status: AC

## 2024-03-03 NOTE — Progress Notes (Signed)
 Pts BP 100/58, MD Hooten made aware, per MD Hooten ok to hold scheduled BP meds (Amlodipine and Propanolol)

## 2024-03-03 NOTE — Plan of Care (Signed)
  Problem: Elimination: Goal: Will not experience complications related to bowel motility Outcome: Progressing   Problem: Safety: Goal: Ability to remain free from injury will improve Outcome: Progressing   Problem: Activity: Goal: Ability to avoid complications of mobility impairment will improve Outcome: Progressing

## 2024-03-03 NOTE — Evaluation (Signed)
 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 History of right total knee arthroplasty, CVA with residual L side weakness.  Clinical Impression  PT evaluation completed. Patient lives with  her spouse who provides intermittent physical assistance with mobility at baseline given history of stroke with left side weakness as well as knee pain with weight bearing. She is able to walk a very limited distance with a 3 wheeled or 2 wheeled walker and requires physical assistance with stair negotiation at home. DME in place.  Today the patient required Min A initially for standing from recliner with CGA for repeated stands. Multi modal cues provided for hand positioning and safe technique with standing. Patient was able to place left hand on the rolling walker and maintain grip with ambulation. Gait training initiated with Min A required initially, progressing to CGA with increased ambulation distance. Functional weakness is noted in the left leg with fatigue with intermittent decreased step length and decreased foot clearance. No knee buckling with walker. The spouse reports this is the farthest patient has been able to walk in quite some time. Stair training also completed. Patient required Min A for going up the steps with mild posterior bias, with increased independence with going down the steps. Railing used on the right only due to L arm weakness, which simulates home set-up. The patient will need intermittent physical assistance with mobility after this hospital stay and spouse reports he is comfortable taking the patient home. Recommend to continue PT to maximize independence and decrease caregiver burden.       If plan is discharge home, recommend the following: A lot of help with walking  and/or transfers;A lot of help with bathing/dressing/bathroom;Supervision due to cognitive status;Help with stairs or ramp for entrance;Assistance with cooking/housework;Assist for transportation   Can travel by private vehicle        Equipment Recommendations None recommended by PT  Recommendations for Other Services       Functional Status Assessment Patient has had a recent decline in their functional status and demonstrates the ability to make significant improvements in function in a reasonable and predictable amount of time.     Precautions / Restrictions Precautions Precautions: Fall Recall of Precautions/Restrictions: Impaired Restrictions Weight Bearing Restrictions Per Provider Order: Yes LLE Weight Bearing Per Provider Order: Weight bearing as tolerated      Mobility  Bed Mobility               General bed mobility comments: not assessed as patient sitting up on arrival and post session    Transfers Overall transfer level: Needs assistance Equipment used: Rolling walker (2 wheels) Transfers: Sit to/from Stand Sit to Stand: Min assist, Contact guard assist           General transfer comment: several standing bouts performed from recliner chair. initial lifting assistance required, progressing to CGA with repeated standing bouts. cues for hand placement for safety    Ambulation/Gait Ambulation/Gait assistance: Contact guard assist, Min assist Gait Distance (Feet):  (62ft, 38ft) Assistive device: Rolling walker (2 wheels) Gait Pattern/deviations: Step-to pattern, Decreased step length - left, Decreased stance time - left, Decreased weight shift to right, Antalgic, Decreased dorsiflexion - left Gait velocity: decreased     General Gait Details: Min A required initially for support, progressing to CGA with  increased walking distance. activity tolerance limited by fatigue, with limited pre-surgery gait distance at baseline. functional weakness is noted in  the left leg with decreased step length and intermittent decreased dorsiflexion with fatigue on the LLE. chair follow for safety with 2nd walking bout. no knee buckling  Stairs Stairs: Yes Stairs assistance: Min assist Stair Management: One rail Right, Step to pattern, Forwards Number of Stairs: 4 General stair comments: patient used railing on right (unable to use left rail functionally due to L arm weakness). therapist first demonstrated correct technique with education provided to patient and spouse. Min A required with going up steps with mild posterior lean. coming down the steps, patient required only CGA. spouse has been physical assisting with stairs at baseline as needed  Wheelchair Mobility     Tilt Bed    Modified Rankin (Stroke Patients Only)       Balance Overall balance assessment: Needs assistance Sitting-balance support: Feet supported Sitting balance-Leahy Scale: Fair     Standing balance support: Bilateral upper extremity supported Standing balance-Leahy Scale: Poor Standing balance comment: intermittent external support provided in addition to the rolling walker. mild posterior bias when going up steps                             Pertinent Vitals/Pain Pain Assessment Pain Assessment: Faces Faces Pain Scale: Hurts little more Pain Location: L knee Pain Descriptors / Indicators: Discomfort Pain Intervention(s): Limited activity within patient's tolerance, Monitored during session, Repositioned    Home Living Family/patient expects to be discharged to:: Private residence Living Arrangements: Spouse/significant other Available Help at Discharge: Family;Available 24 hours/day Type of Home: House Home Access: Stairs to enter Entrance Stairs-Rails: Doctor, general practice of Steps: 2- back has 6 steps with B rails husband is always near/holds hand   Home Layout: Able to live on main level with bedroom/bathroom;Two level Home Equipment:  Agricultural consultant (2 wheels);Shower seat;Grab bars - toilet;Grab bars - tub/shower;BSC/3in1;Wheelchair - manual;Cane - single point;Toilet riser (gait belt)      Prior Function Prior Level of Function : Needs assist             Mobility Comments: 3 wheel rollator- but uses RW now, very limited distance prior to surgery due to pain. spouse assisted with mobility as needed, including going up and down the steps       Extremity/Trunk Assessment   Upper Extremity Assessment Upper Extremity Assessment: LUE deficits/detail;Defer to OT evaluation LUE Deficits / Details: patient has difficulty opening the left hand but is able to grip the walker and maintain grip with ambulation    Lower Extremity Assessment Lower Extremity Assessment: LLE deficits/detail LLE Deficits / Details: dorsiflexion 5/5, plantarflexion 5/5, able to complete LAQ and hold against gravity. no knee buckling with weight bearing. functional weakness is noted with ambulation       Communication   Communication Communication: No apparent difficulties    Cognition Arousal: Alert Behavior During Therapy: Impulsive (midly impulsive)   PT - Cognitive impairments: Orientation   Orientation impairments: Situation, Time                   PT - Cognition Comments: spouse reports patient is midly confused compared to baseline. she does recall situation with prompting. she is able to follow single step commands with increased time. Following commands: Impaired Following commands impaired: Follows one step commands with increased time (occasional multi modal cues)     Cueing  Cueing Techniques: Verbal cues, Tactile cues, Visual cues     General Comments      Exercises Total Joint Exercises Goniometric ROM: L knee flexion 80 degrees measured in sitting position   Assessment/Plan    PT Assessment Patient needs continued PT services  PT Problem List Decreased strength;Decreased range of motion;Decreased activity  tolerance;Decreased balance;Decreased mobility;Pain;Decreased safety awareness       PT Treatment Interventions DME instruction;Gait training;Stair training;Functional mobility training;Therapeutic activities;Therapeutic exercise;Balance training;Neuromuscular re-education;Cognitive remediation;Patient/family education;Wheelchair mobility training    PT Goals (Current goals can be found in the Care Plan section)  Acute Rehab PT Goals Patient Stated Goal: to go home with home health PT PT Goal Formulation: With patient/family Time For Goal Achievement: 03/17/24 Potential to Achieve Goals: Fair    Frequency BID     Co-evaluation               AM-PAC PT "6 Clicks" Mobility  Outcome Measure Help needed turning from your back to your side while in a flat bed without using bedrails?: A Little Help needed moving from lying on your back to sitting on the side of a flat bed without using bedrails?: A Little Help needed moving to and from a bed to a chair (including a wheelchair)?: A Little Help needed standing up from a chair using your arms (e.g., wheelchair or bedside chair)?: A Little Help needed to walk in hospital room?: A Little Help needed climbing 3-5 steps with a railing? : A Little 6 Click Score: 18    End of Session Equipment Utilized During Treatment: Gait belt Activity Tolerance: Patient limited by fatigue;Patient tolerated treatment well Patient left: in chair;with call bell/phone within reach;with family/visitor present (set-up with breakfast) Nurse Communication: Mobility status PT Visit Diagnosis: Difficulty in walking, not elsewhere classified (R26.2);Other abnormalities of gait and mobility (R26.89)    Time: 7829-5621 PT Time Calculation (min) (ACUTE ONLY): 25 min   Charges:   PT Evaluation $PT Eval Moderate Complexity: 1 Mod PT Treatments $Gait Training: 8-22 mins PT General Charges $$ ACUTE PT VISIT: 1 Visit         Donna Bernard, PT,  MPT  [employer] 03/03/2024, 10:30 AM

## 2024-03-03 NOTE — Evaluation (Signed)
 Occupational Therapy Evaluation Patient Details Name: [name] MRN: [id number] DOB: 06/21/1941 Today's Date: 03/03/2024   History of Present Illness   Pt is an 83 year old female s/p L TKA 03/02/24    PMH significant for CVA with L sided weakness, R TKA     Clinical Impressions Pt seen for OT evaluation this date, POD#1 from above surgery. Pt is alert, oriented to self and place, cueing for date/situation. PTA pt has assist for ADL/IADL from husband, amb household distances with youth walker, husband reports he is always close to assist.She has baseline L sided weakness from previous CVA. Pt husband reports he plans to assist with all ADL/mobility as needed. Pt/husband provided education re: polar care mgt, falls prevention strategies, home/routines modifications, DME/AE for LB bathing and dressing tasks, and compression stocking mgt. All questions answered within scope. Mild dizziness with position changes, resolves quickly after position changes. Please see below for further details on ADL section, pt did require multi modal cues for tasks but demonstrated carry over with continued practice. LUE initialy with increased spasticity but seemed to improve as pt utilized it during functional tasks. Team notified. pt would benefit from skilled OT services including additional instruction in dressing techniques with or without assistive devices for dressing and bathing skills to support recall and carryover prior to discharge and ultimately to maximize safety, independence, and minimize falls risk and caregiver burden.     If plan is discharge home, recommend the following:   A little help with walking and/or transfers;A little help with bathing/dressing/bathroom;Supervision due to cognitive status     Functional Status Assessment   Patient has had a recent decline in their functional status and demonstrates the ability to make significant improvements in function in a reasonable and predictable  amount of time.     Equipment Recommendations   None recommended by OT;Other (comment) (pt has recommended equipment)     Recommendations for Other Services         Precautions/Restrictions   Precautions Precautions: Fall Recall of Precautions/Restrictions: Impaired Restrictions Weight Bearing Restrictions Per Provider Order: Yes LLE Weight Bearing Per Provider Order: Weight bearing as tolerated     Mobility Bed Mobility Overal bed mobility: Needs Assistance Bed Mobility: Supine to Sit     Supine to sit: Mod assist, HOB elevated, Used rails          Transfers Overall transfer level: Needs assistance Equipment used: Rolling walker (2 wheels) Transfers: Sit to/from Stand Sit to Stand: Min assist           General transfer comment: multiple attempts; frequent multi modal cues for sequencing      Balance Overall balance assessment: Needs assistance Sitting-balance support: Feet supported Sitting balance-Leahy Scale: Fair     Standing balance support: Bilateral upper extremity supported Standing balance-Leahy Scale: Poor                             ADL either performed or assessed with clinical judgement   ADL Overall ADL's : Needs assistance/impaired     Grooming: Sitting;Supervision/safety           Upper Body Dressing : Minimal assistance;Sitting Upper Body Dressing Details (indicate cue type and reason): doff gown, donn shirt; husband will assist at home Lower Body Dressing: Moderate assistance;Sit to/from stand Lower Body Dressing Details (indicate cue type and reason): underwear and pants; husband will assist at home Toilet Transfer: Minimal assistance;Rolling walker (2 wheels)  Toilet Transfer Details (indicate cue type and reason): simulated to bedside chair Toileting- Clothing Manipulation and Hygiene: Moderate assistance;Sit to/from stand       Functional mobility during ADLs: Minimal assistance;Rolling walker (2  wheels)       Vision Patient Visual Report: No change from baseline       Perception         Praxis         Pertinent Vitals/Pain Pain Assessment Pain Assessment: 0-10 Faces Pain Scale: Hurts whole lot Pain Location: L knee Pain Descriptors / Indicators: Discomfort Pain Intervention(s): Monitored during session, Limited activity within patient's tolerance, Repositioned, Ice applied     Extremity/Trunk Assessment Upper Extremity Assessment Upper Extremity Assessment: LUE deficits/detail LUE Deficits / Details: baseline L sided weakess. AROM shoulder flexion approx 1/2 full AROM, elbow flexion approx 3/4 full AROM, wrist/hand 1/2 full AROM (which husband reports is baseline); Increased spasticity noted opening/closing hand (pt husband reports she normally can open/close it but does have some baseline increased tone), seems to improve with continued use througout session; team notified   Lower Extremity Assessment Lower Extremity Assessment: Defer to PT evaluation LLE Deficits / Details: dorsiflexion 5/5, plantarflexion 5/5, able to complete LAQ and hold against gravity. no knee buckling with weight bearing. 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, pt husband reports pt is more "fuzzy" today; carry over noted with vcs for safety                 Following commands:  Impaired Following commands impaired: Follows one step commands with increased time     Cueing  General Comments   Cueing Techniques: Verbal cues;Tactile cues;Visual cues  vss throughout   Exercises     Shoulder Instructions      Home Living Family/patient expects to be discharged to:: Private residence Living Arrangements: Spouse/significant other Available Help at Discharge: Family;Available 24 hours/day Type of Home: House Home Access: Stairs to enter [employer] of Steps: 2- back has 6 steps with B rails husband is always near/holds hand Entrance Stairs-Rails: Right;Left Home Layout: Able to live on main level with bedroom/bathroom;Two level     Bathroom Shower/Tub: Producer, television/film/video: Handicapped height Bathroom Accessibility: Yes How Accessible: Accessible via walker Home Equipment: Agricultural consultant (2 wheels);Shower seat;Grab bars - toilet;Grab bars - tub/shower;BSC/3in1;Wheelchair - manual;Cane - single point;Toilet riser (gait belt)          Prior Functioning/Environment Prior Level of Function : Needs assist             Mobility Comments: 3 wheel rollator- but uses RW now, very limited distance prior to surgery due to pain. spouse assisted with mobility as needed, including going up and down the steps ADLs Comments: assist for ADLs (including dressing, bathing), feed herself and performs grooming with PRN assist; assist for IADLs    OT Problem List: Decreased activity tolerance;Decreased strength;Impaired balance (sitting and/or standing);Decreased cognition;Decreased knowledge of precautions;Decreased safety awareness;Decreased knowledge of use of DME or AE   OT Treatment/Interventions: Self-care/ADL training;DME and/or AE instruction;Therapeutic activities;Balance training;Therapeutic exercise;Energy  conservation;Patient/family education      OT Goals(Current goals can be found in the care plan section)   Acute Rehab OT  Goals Patient Stated Goal: go home OT Goal Formulation: With patient/family Time For Goal Achievement: 03/17/24 Potential to Achieve Goals: Good ADL Goals Pt Will Perform Lower Body Dressing: with min assist;sitting/lateral leans;sit to/from stand;with caregiver independent in assisting Pt Will Transfer to Toilet: with contact guard assist;ambulating Pt Will Perform Toileting - Clothing Manipulation and hygiene: with min assist;sit to/from stand   OT Frequency:  Min 2X/week    Co-evaluation              AM-PAC OT "6 Clicks" Daily Activity     Outcome Measure Help from another person eating meals?: None Help from another person taking care of personal grooming?: A Little Help from another person toileting, which includes using toliet, bedpan, or urinal?: A Lot Help from another person bathing (including washing, rinsing, drying)?: A Lot Help from another person to put on and taking off regular upper body clothing?: A Little Help from another person to put on and taking off regular lower body clothing?: A Lot 6 Click Score: 16   End of Session Equipment Utilized During Treatment: Gait belt;Other (comment) (youth walker) Nurse Communication: Mobility status;Other (comment) (LUE function- notified team as well)  Activity Tolerance: Patient tolerated treatment well Patient left: in chair;with call bell/phone within reach;with family/visitor present;Other (comment) (husband present)  OT Visit Diagnosis: Other abnormalities of gait and mobility (R26.89);Muscle weakness (generalized) (M62.81);Unsteadiness on feet (R26.81)                Time: 1610-9604 OT Time Calculation (min): 25 min Charges:  OT General Charges $OT Visit: 1 Visit OT Evaluation $OT Eval Low Complexity: 1 Low  [name], OTD OTR/L  03/03/24, 10:43 AM

## 2024-03-03 NOTE — Progress Notes (Signed)
 DISCHARGE NOTE:  Pt and husband given discharge instructions, scripts, 2 honeycomb dressings and verbalized understanding. TED hose on both legs. Pt wheeled to car by staff, husband providing transportation home.

## 2024-03-04 DIAGNOSIS — I110 Hypertensive heart disease with heart failure: Secondary | ICD-10-CM | POA: Diagnosis not present

## 2024-03-04 DIAGNOSIS — R569 Unspecified convulsions: Secondary | ICD-10-CM | POA: Diagnosis not present

## 2024-03-04 DIAGNOSIS — K861 Other chronic pancreatitis: Secondary | ICD-10-CM | POA: Diagnosis not present

## 2024-03-04 DIAGNOSIS — Z471 Aftercare following joint replacement surgery: Secondary | ICD-10-CM | POA: Diagnosis not present

## 2024-03-04 DIAGNOSIS — F32A Depression, unspecified: Secondary | ICD-10-CM | POA: Diagnosis not present

## 2024-03-04 DIAGNOSIS — F419 Anxiety disorder, unspecified: Secondary | ICD-10-CM | POA: Diagnosis not present

## 2024-03-04 DIAGNOSIS — D509 Iron deficiency anemia, unspecified: Secondary | ICD-10-CM | POA: Diagnosis not present

## 2024-03-04 DIAGNOSIS — J45909 Unspecified asthma, uncomplicated: Secondary | ICD-10-CM | POA: Diagnosis not present

## 2024-03-04 DIAGNOSIS — I5032 Chronic diastolic (congestive) heart failure: Secondary | ICD-10-CM | POA: Diagnosis not present

## 2024-03-08 ENCOUNTER — Emergency Department

## 2024-03-08 ENCOUNTER — Observation Stay

## 2024-03-08 ENCOUNTER — Encounter: Payer: Medicare HMO | Admitting: Occupational Therapy

## 2024-03-08 ENCOUNTER — Other Ambulatory Visit: Payer: Self-pay

## 2024-03-08 ENCOUNTER — Observation Stay
Admission: EM | Admit: 2024-03-08 | Discharge: 2024-03-10 | Disposition: A | Discharge status: Home / Self Care | Attending: Internal Medicine | Admitting: Internal Medicine

## 2024-03-08 ENCOUNTER — Ambulatory Visit: Payer: Medicare HMO | Admitting: Physical Therapy

## 2024-03-08 DIAGNOSIS — Z471 Aftercare following joint replacement surgery: Secondary | ICD-10-CM | POA: Diagnosis not present

## 2024-03-08 DIAGNOSIS — R112 Nausea with vomiting, unspecified: Secondary | ICD-10-CM | POA: Insufficient documentation

## 2024-03-08 DIAGNOSIS — Z96652 Presence of left artificial knee joint: Secondary | ICD-10-CM | POA: Diagnosis not present

## 2024-03-08 DIAGNOSIS — K529 Noninfective gastroenteritis and colitis, unspecified: Secondary | ICD-10-CM | POA: Insufficient documentation

## 2024-03-08 DIAGNOSIS — I5032 Chronic diastolic (congestive) heart failure: Secondary | ICD-10-CM | POA: Diagnosis not present

## 2024-03-08 DIAGNOSIS — I10 Essential (primary) hypertension: Secondary | ICD-10-CM | POA: Diagnosis not present

## 2024-03-08 DIAGNOSIS — J45909 Unspecified asthma, uncomplicated: Secondary | ICD-10-CM | POA: Diagnosis not present

## 2024-03-08 DIAGNOSIS — F419 Anxiety disorder, unspecified: Secondary | ICD-10-CM | POA: Diagnosis not present

## 2024-03-08 DIAGNOSIS — Z7901 Long term (current) use of anticoagulants: Secondary | ICD-10-CM | POA: Diagnosis not present

## 2024-03-08 DIAGNOSIS — Z91040 Latex allergy status: Secondary | ICD-10-CM | POA: Insufficient documentation

## 2024-03-08 DIAGNOSIS — G8918 Other acute postprocedural pain: Secondary | ICD-10-CM | POA: Insufficient documentation

## 2024-03-08 DIAGNOSIS — D649 Anemia, unspecified: Principal | ICD-10-CM | POA: Diagnosis present

## 2024-03-08 DIAGNOSIS — I959 Hypotension, unspecified: Secondary | ICD-10-CM | POA: Diagnosis not present

## 2024-03-08 DIAGNOSIS — Z96659 Presence of unspecified artificial knee joint: Secondary | ICD-10-CM | POA: Diagnosis not present

## 2024-03-08 DIAGNOSIS — E785 Hyperlipidemia, unspecified: Secondary | ICD-10-CM | POA: Diagnosis not present

## 2024-03-08 DIAGNOSIS — K449 Diaphragmatic hernia without obstruction or gangrene: Secondary | ICD-10-CM | POA: Diagnosis not present

## 2024-03-08 DIAGNOSIS — I130 Hypertensive heart and chronic kidney disease with heart failure and stage 1 through stage 4 chronic kidney disease, or unspecified chronic kidney disease: Secondary | ICD-10-CM | POA: Diagnosis not present

## 2024-03-08 DIAGNOSIS — N1831 Chronic kidney disease, stage 3a: Secondary | ICD-10-CM | POA: Insufficient documentation

## 2024-03-08 DIAGNOSIS — F32A Depression, unspecified: Secondary | ICD-10-CM | POA: Diagnosis not present

## 2024-03-08 DIAGNOSIS — K5900 Constipation, unspecified: Secondary | ICD-10-CM | POA: Diagnosis not present

## 2024-03-08 DIAGNOSIS — Z79899 Other long term (current) drug therapy: Secondary | ICD-10-CM | POA: Diagnosis not present

## 2024-03-08 DIAGNOSIS — M25512 Pain in left shoulder: Secondary | ICD-10-CM | POA: Diagnosis not present

## 2024-03-08 DIAGNOSIS — K861 Other chronic pancreatitis: Secondary | ICD-10-CM | POA: Diagnosis not present

## 2024-03-08 DIAGNOSIS — D509 Iron deficiency anemia, unspecified: Secondary | ICD-10-CM | POA: Diagnosis not present

## 2024-03-08 DIAGNOSIS — Z8673 Personal history of transient ischemic attack (TIA), and cerebral infarction without residual deficits: Secondary | ICD-10-CM | POA: Diagnosis not present

## 2024-03-08 DIAGNOSIS — R569 Unspecified convulsions: Secondary | ICD-10-CM | POA: Diagnosis not present

## 2024-03-08 DIAGNOSIS — R0902 Hypoxemia: Secondary | ICD-10-CM | POA: Diagnosis not present

## 2024-03-08 DIAGNOSIS — E039 Hypothyroidism, unspecified: Secondary | ICD-10-CM | POA: Diagnosis not present

## 2024-03-08 DIAGNOSIS — I110 Hypertensive heart disease with heart failure: Secondary | ICD-10-CM | POA: Diagnosis not present

## 2024-03-08 DIAGNOSIS — D72829 Elevated white blood cell count, unspecified: Secondary | ICD-10-CM | POA: Diagnosis present

## 2024-03-08 DIAGNOSIS — Z0389 Encounter for observation for other suspected diseases and conditions ruled out: Secondary | ICD-10-CM | POA: Diagnosis not present

## 2024-03-08 DIAGNOSIS — R531 Weakness: Secondary | ICD-10-CM | POA: Diagnosis not present

## 2024-03-08 LAB — BASIC METABOLIC PANEL WITH GFR
Anion gap: 11 (ref 5–15)
BUN: 23 mg/dL (ref 8–23)
CO2: 22 mmol/L (ref 22–32)
Calcium: 8.8 mg/dL — ABNORMAL LOW (ref 8.9–10.3)
Chloride: 102 mmol/L (ref 98–111)
Creatinine, Ser: 1.07 mg/dL — ABNORMAL HIGH (ref 0.44–1.00)
GFR, Estimated: 52 mL/min — ABNORMAL LOW (ref >60)
Glucose, Bld: 68 mg/dL — ABNORMAL LOW (ref 70–99)
Potassium: 4.3 mmol/L (ref 3.5–5.1)
Sodium: 135 mmol/L (ref 135–145)

## 2024-03-08 LAB — CBC
HCT: 23 % — ABNORMAL LOW (ref 36.0–46.0)
Hemoglobin: 7.3 g/dL — ABNORMAL LOW (ref 12.0–15.0)
MCH: 26 pg (ref 26.0–34.0)
MCHC: 31.7 g/dL (ref 30.0–36.0)
MCV: 81.9 fL (ref 80.0–100.0)
Platelets: 573 10*3/uL — ABNORMAL HIGH (ref 150–400)
RBC: 2.81 MIL/uL — ABNORMAL LOW (ref 3.87–5.11)
RDW: 23.6 % — ABNORMAL HIGH (ref 11.5–15.5)
WBC: 17.9 10*3/uL — ABNORMAL HIGH (ref 4.0–10.5)
nRBC: 0 % (ref 0.0–0.2)

## 2024-03-08 LAB — RETICULOCYTES
Immature Retic Fract: 46.8 % — ABNORMAL HIGH (ref 2.3–15.9)
RBC.: 3.27 MIL/uL — ABNORMAL LOW (ref 3.87–5.11)
Retic Count, Absolute: 66.7 10*3/uL (ref 19.0–186.0)
Retic Ct Pct: 2 % (ref 0.4–3.1)

## 2024-03-08 LAB — IRON AND TIBC
Iron: 35 ug/dL (ref 28–170)
Saturation Ratios: 15 % (ref 10.4–31.8)
TIBC: 237 ug/dL — ABNORMAL LOW (ref 250–450)
UIBC: 202 ug/dL

## 2024-03-08 LAB — LACTIC ACID, PLASMA
Lactic Acid, Venous: 1.2 mmol/L (ref 0.5–1.9)
Lactic Acid, Venous: 0.8 mmol/L (ref 0.5–1.9)

## 2024-03-08 LAB — TROPONIN I (HIGH SENSITIVITY)
Troponin I (High Sensitivity): 9 ng/L (ref <18)
Troponin I (High Sensitivity): 8 ng/L (ref <18)

## 2024-03-08 LAB — PREPARE RBC (CROSSMATCH)

## 2024-03-08 LAB — PROTIME-INR
INR: 1.1 (ref 0.8–1.2)
Prothrombin Time: 14.6 s (ref 11.4–15.2)

## 2024-03-08 LAB — BRAIN NATRIURETIC PEPTIDE: B Natriuretic Peptide: 143.6 pg/mL — ABNORMAL HIGH (ref 0.0–100.0)

## 2024-03-08 LAB — FOLATE: Folate: 38 ng/mL (ref >5.9)

## 2024-03-08 LAB — FERRITIN: Ferritin: 285 ng/mL (ref 11–307)

## 2024-03-08 LAB — APTT: aPTT: 31 s (ref 24–36)

## 2024-03-08 LAB — VITAMIN B12: Vitamin B-12: 357 pg/mL (ref 180–914)

## 2024-03-08 MED ORDER — IOHEXOL 9 MG/ML PO SOLN
500.0000 mL | ORAL | Status: AC
Start: 2024-03-08 — End: 2024-03-08

## 2024-03-08 MED ORDER — DM-GUAIFENESIN ER 30-600 MG PO TB12: 1.0000 | ORAL_TABLET | Freq: Two times a day (BID) | ORAL | Status: DC | PRN

## 2024-03-08 MED ORDER — OXYCODONE-ACETAMINOPHEN 5-325 MG PO TABS
1.0000 | ORAL_TABLET | ORAL | Status: DC | PRN
  Administered 2024-03-08: 1 via ORAL
  Filled 2024-03-08: qty 1

## 2024-03-08 MED ORDER — SODIUM CHLORIDE 0.9 % IV BOLUS
1000.0000 mL | Freq: Once | INTRAVENOUS | Status: AC
  Administered 2024-03-08: 1000 mL via INTRAVENOUS

## 2024-03-08 MED ORDER — DEXTROSE 50 % IV SOLN: 50.0000 mL | INTRAVENOUS | Status: DC | PRN

## 2024-03-08 MED ORDER — LIDOCAINE 5 % EX PTCH
1.0000 | MEDICATED_PATCH | CUTANEOUS | Status: DC
  Administered 2024-03-09: 1 via TRANSDERMAL
  Filled 2024-03-08 (×3): qty 1

## 2024-03-08 MED ORDER — ALBUTEROL SULFATE (2.5 MG/3ML) 0.083% IN NEBU: 2.5000 mL | INHALATION_SOLUTION | RESPIRATORY_TRACT | Status: DC | PRN

## 2024-03-08 MED ORDER — ONDANSETRON HCL 4 MG/2ML IJ SOLN
4.0000 mg | Freq: Once | INTRAMUSCULAR | Status: AC
  Administered 2024-03-08: 4 mg via INTRAVENOUS
  Filled 2024-03-08: qty 2

## 2024-03-08 MED ORDER — MORPHINE SULFATE (PF) 2 MG/ML IV SOLN
1.0000 mg | INTRAVENOUS | Status: DC | PRN
  Administered 2024-03-09: 1 mg via INTRAVENOUS
  Filled 2024-03-08 (×2): qty 1

## 2024-03-08 MED ORDER — HYDRALAZINE HCL 20 MG/ML IJ SOLN: 5.0000 mg | INTRAMUSCULAR | Status: DC | PRN

## 2024-03-08 MED ORDER — METHOCARBAMOL 500 MG PO TABS: 500.0000 mg | ORAL_TABLET | Freq: Three times a day (TID) | ORAL | Status: DC | PRN

## 2024-03-08 MED ORDER — ACETAMINOPHEN 325 MG PO TABS: 650.0000 mg | ORAL_TABLET | Freq: Four times a day (QID) | ORAL | Status: DC | PRN

## 2024-03-08 MED ORDER — ONDANSETRON HCL 4 MG/2ML IJ SOLN: 4.0000 mg | Freq: Three times a day (TID) | INTRAMUSCULAR | Status: DC | PRN

## 2024-03-08 MED ORDER — MORPHINE SULFATE (PF) 4 MG/ML IV SOLN
4.0000 mg | Freq: Once | INTRAVENOUS | Status: AC
  Administered 2024-03-08: 4 mg via INTRAVENOUS
  Filled 2024-03-08: qty 1

## 2024-03-08 MED ORDER — SODIUM CHLORIDE 0.9 % IV SOLN
10.0000 mL/h | Freq: Once | INTRAVENOUS | Status: AC
  Administered 2024-03-08: 10 mL/h via INTRAVENOUS

## 2024-03-08 NOTE — ED Notes (Signed)
 Blood Consent signed on paper in chart.

## 2024-03-08 NOTE — H&P (Signed)
 History and Physical    [name]:[id number] DOB: [birth year] DOA: 03/08/2024  Referring MD/NP/PA:   PCP: Marguarite Arbour, MD   Patient coming from:  The patient is coming from home.     Chief Complaint: weakness, nausea, vomiting, constipation, abdominal pain, left shoulder pain  HPI: [name] is a 83 y.o. 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  Patient was recently hospitalized from 3/26 - 3/27 for left knee replacement by Dr. Ernest Pine.  She continues to have some pain at surgical site of left knee, no erythema or redness in surgical site.  No fever or chills.  She states she had left shoulder injury in the past.  Now she has left shoulder pain which is constant, sharp, mild to moderate, nonradiating, aggravated by movement.  No new injury.  Patient states she has hemorrhoid with intermittent bleeding, but no active bleeding today.  No dark stool today.  Patient states that she has nausea, vomiting, intermittent diarrhea and constipation alternatively for more than 4 days.  She has poor appetite and decreased oral intake.  She has mild middle abdominal pain.  No fever or chills.  Patient does not have chest pain, cough, SOB.  No symptoms of UTI.   Data reviewed independently and ED Course: pt was found to have WBC 17.9, stable renal function, troponin 9 --> 8, lactic acid 1.2 --> 0.8, temperature normal, blood pressure 103/48, heart rate 81, RR 20, oxygen saturation 100% on room air.  Patient is placed in telemetry bed for observation.  X-ray of left  shoulder: Probable subacute versus acute undisplaced fracture of the acromion process of the left  scapula. Correlate clinically.    CT-left shoulder 1. No acute fracture or dislocation. 2. Superior subluxation of the humeral head in relation to the glenoid compatible with thinning and probable partial tear of the supraspinatus. Moderate atrophy of the supraspinatus muscle.    CT-abdomen/pelvis 1. Mild asymmetric wall thickening of the distal transverse colon, which could reflect underlying inflammatory or infectious colitis. Please correlate with clinical presentation. 2. No bowel obstruction or ileus. Minimal retained stool within the colon. 3. Stable 4.7 cm simple appearing left adnexal cyst, likely benign given long-term stability. 4.  Aortic Atherosclerosis (ICD10-I70.0).     EKG: I have personally reviewed.  Sinus rhythm, QTc 416, no ischemic change.   Review of Systems:   General: no fevers, chills, no body weight gain, has poor appetite, has fatigue HEENT: no blurry vision, hearing changes or sore throat Respiratory: no dyspnea, coughing, wheezing CV: no chest pain, no palpitations GI: has nausea, vomiting, abdominal pain, intermittent diarrhea and constipation relatively GU: no dysuria, burning on urination, increased urinary frequency, hematuria  Ext: no leg edema Neuro: no unilateral weakness, numbness, or tingling, no vision change or hearing loss Skin: no rash, no skin tear. MSK: has pain in left shoulder and left knee surgical site Heme: No easy bruising.  Travel history: No recent long distant travel.   Allergy:  Allergies  Allergen Reactions   Cinobac [Cinoxacin] Anaphylaxis    "My throat swelled and I stopped breathing."   Latex Dermatitis    IgE = 14 (WNL) on 04/17/2022  Pt is highly reactive to any latex products.  Blisters skin. "Takes my skin off"  "Skin will tears off"  IgE = 14 (WNL) on 04/17/2022  Pt is highly reactive to any latex products. Blisters skin. "Takes my skin off"   Other Shortness Of Breath    Muscle relaxers? Pt thinks  allergic to muscle relaxers, reports stops breathing but not sure what medicine and if for sure muscle relaxer  Petroleum, Chemicals, Paints, Muscle Relaxers   Petrolatum Distillates [Petroleum Distillate] Shortness Of Breath    Passes out    Sulfa Antibiotics Itching   Meloxicam     Other Reaction(s): Not available, Unknown   Misc. Sulfonamide Containing Compounds Other (See Comments)   Nylon Itching and Rash    Past Medical History:  Diagnosis Date   Anemia    Arthritis    Asthma    uses inhaler just prior to surgery to avoid attack   Back pain    from previous injury   Cerebral microvascular disease    Chronic diastolic CHF (congestive heart failure) (HCC)    Complication of anesthesia    has woken  up during 2 different surgery   Depression    Gallstones    GERD (gastroesophageal reflux disease)    Hiatal hernia    History of kidney stones    HLD (hyperlipidemia)    HTN (hypertension)    Hypothyroidism    Iron deficiency anemia 12/2023   Kidney stones    Knee pain    Nausea and vomiting 10/15/2022   Non-diabetic pancreatic hormone dysfunction years   pt. states pancreas does not function properly   Ovarian cyst    Pancreatitis    Pneumonia, aspiration (HCC)    PONV (postoperative nausea and vomiting)    Seizures (HCC)    caused by dye injected during a procedure   Shortness of breath on exertion    Sinus problem    frequent infections/congestion   Stroke (HCC) 01/24/2020   a.) MRI brain 01/24/2020: small subacute infarct in the RIGHT frontal white matter   Stroke (HCC) 08/19/2022   a.) MRI brain 08/19/2022: acute infarcts in the RIGHT carona radiata and RIGHT anterior thalamus; chronic infarcts in the basal ganglia BILATERALLY   Thrombocytosis 12/2023   Vertebral artery stenosis, right    a.) MRI 12/03/2022: complete occlusion of the distal V2, V3, and V4 segments    Past Surgical History:  Procedure Laterality Date   APPENDECTOMY  1965   BILATERAL  SALPINGOOPHORECTOMY  1965   CARPAL TUNNEL RELEASE Bilateral    CATARACT EXTRACTION W/ INTRAOCULAR LENS  IMPLANT, BILATERAL Bilateral    FOOT OSTEOTOMY  6 weeks ago   Left foot: great, 2nd & 3rd   FOOT OSTEOTOMY  5 years ago   Right great toe   HAND SURGERY Bilateral 2011-most recent   multiple hand surgeries, 2 on left, 3 on right   KNEE ARTHROPLASTY Right 04/28/2022   Procedure: COMPUTER ASSISTED TOTAL KNEE ARTHROPLASTY;  Surgeon: Donato Heinz, MD;  Location: [facility];  Service: Orthopedics;  Laterality: Right;   KNEE ARTHROPLASTY Left 03/02/2024   Procedure: ARTHROPLASTY, KNEE, TOTAL, USING IMAGELESS COMPUTER-ASSISTED NAVIGATION;  Surgeon: Donato Heinz, MD;  Location: [facility];  Service: Orthopedics;  Laterality: Left;   LOOP RECORDER INSERTION N/A 05/16/2020   Procedure: LOOP RECORDER INSERTION;  Surgeon: Marcina Millard, MD;  Location: [facility];  Service: Cardiovascular;  Laterality: N/A;   NASAL SINUS SURGERY  most recent 7-8 yrs ago   7 sinus surgeries    PARTIAL  HYSTERECTOMY  1963   TRIGGER FINGER RELEASE  11/19/2011   Procedure: RELEASE TRIGGER FINGER/A-1 PULLEY;  Surgeon: Nicki Reaper, MD;  Location: [facility];  Service: Orthopedics;  Laterality: Right;  release a-1 pulley right index finger and cyst removal   WRIST GANGLION EXCISION  1980's   right    Social History:  reports that she has never smoked. She has never used smokeless tobacco. She reports that she does not currently use alcohol. She reports that she does not use drugs.  Family History:  Family History  Problem Relation Age of Onset   Anesthesia problems Father        "bad lungs" couldn't wake him up   Heart disease Father    Heart attack Father 25   Breast cancer Maternal Grandmother    Breast cancer Maternal Aunt        x 2   Breast cancer Cousin    Pancreatic cancer Cousin    Heart attack Paternal Uncle    Stroke Paternal Grandfather      Prior to Admission  medications   Medication Sig Start Date End Date Taking? Authorizing Provider  acetaminophen (TYLENOL) 325 MG tablet Take 2 tablets (650 mg total) by mouth every 4 (four) hours as needed for mild pain (or temp > 37.5 C (99.5 F)). 12/09/22   Angiulli, Mcarthur Rossetti, PA-C  albuterol (VENTOLIN HFA) 108 (90 Base) MCG/ACT inhaler Inhale 2 puffs into the lungs every 4 (four) hours as needed for wheezing or shortness of breath. 12/09/22   Angiulli, Mcarthur Rossetti, PA-C  amLODipine (NORVASC) 10 MG tablet Take 10 mg by mouth daily. 01/13/23   [provider]  aspirin EC 81 MG tablet Take 1 tablet (81 mg total) by mouth 2 (two) times daily. Swallow whole. 03/03/24   Rayburn Go, PA-C  atorvastatin (LIPITOR) 40 MG tablet Take 1 tablet (40 mg total) by mouth daily. 12/09/22   Angiulli, Mcarthur Rossetti, PA-C  celecoxib (CELEBREX) 200 MG capsule Take 1 capsule (200 mg total) by mouth 2 (two) times daily. 03/03/24   Rayburn Go, PA-C  Cholecalciferol (VITAMIN D3) 125 MCG (5000 UT) CAPS Take 1 capsule by mouth daily.    [provider]  clopidogrel (PLAVIX) 75 MG tablet Take 1 tablet (75 mg total) by mouth daily. 12/09/22   Angiulli, Mcarthur Rossetti, PA-C  diclofenac Sodium (VOLTAREN) 1 % GEL Apply 2 g topically 4 (four) times daily. Patient taking differently: Apply 2 g topically daily as needed (pain). 12/09/22   Angiulli, Mcarthur Rossetti, PA-C  Ferrous Sulfate (IRON PO) Take 1 tablet by mouth daily. 9 mg gummy    [provider]  folic acid (FOLVITE) 800 MCG tablet Take 1 tablet (800 mcg total) by mouth daily. Patient taking differently: Take 3,200 mcg by mouth daily. 12/09/22   Angiulli, Mcarthur Rossetti, PA-C  gabapentin (NEURONTIN) 300 MG capsule Take 1 capsule (300 mg total) by mouth 2 (two) times daily. 12/09/22   Angiulli, Mcarthur Rossetti, PA-C  KLOR-CON M10 10 MEQ tablet Take 20 mEq by mouth at bedtime.    [provider]  levothyroxine (SYNTHROID) 50 MCG tablet Take 50 mcg by mouth daily before breakfast. 01/19/23    [provider]  Magnesium 250 MG TABS Take 250 mg by mouth daily.    [provider]  montelukast (SINGULAIR) 10 MG tablet Take 10 mg by mouth at bedtime. 12/09/22 02/23/24  [provider]  Multiple Vitamins-Minerals (HAIR SKIN & NAILS ADVANCED PO) Take 1  tablet by mouth daily.    [provider]  Multiple Vitamins-Minerals (MULTIVITAMIN GUMMIES ADULT) CHEW Chew 2 capsules by mouth daily.    [provider]  oxyCODONE (OXY IR/ROXICODONE) 5 MG immediate release tablet Take 1 tablet (5 mg total) by mouth every 4 (four) hours as needed for moderate pain (pain score 4-6) (pain score 4-6). 03/03/24   Rayburn Go, PA-C  pantoprazole (PROTONIX) 40 MG tablet TAKE 1 TABLET BY MOUTH EVERY DAY 08/11/23   Elijah Birk C, DO  propranolol ER (INDERAL LA) 60 MG 24 hr capsule Take 1 capsule (60 mg total) by mouth daily. 12/09/22   Angiulli, Mcarthur Rossetti, PA-C  traMADol (ULTRAM) 50 MG tablet Take 1-2 tablets (50-100 mg total) by mouth every 4 (four) hours as needed for moderate pain (pain score 4-6). 03/03/24   Rayburn Go, PA-C    Physical Exam: Vitals:   03/08/24 1831 03/08/24 2027 03/08/24 2027 03/09/24 0021  BP: (!) 103/48 (!) 109/55 (!) 109/55 (!) 114/49  Pulse: 78 79 79 79  Resp: 17 18 18 18   Temp: 98 F (36.7 C) 99 F (37.2 C) 99 F (37.2 C) 98.8 F (37.1 C)  TempSrc: Oral Axillary Axillary   SpO2: 100% 100% 100% 100%  Weight:   49.4 kg   Height:   4\' 11"  (1.499 m)    General: Not in acute distress HEENT:       Eyes: PERRL, EOMI, no jaundice       ENT: No discharge from the ears and nose, no pharynx injection, no tonsillar enlargement.        Neck: No JVD, no bruit, no mass felt. Heme: No neck lymph node enlargement. Cardiac: S1/S2, RRR, No murmurs, No gallops or rubs. Respiratory: No rales, wheezing, rhonchi or rubs. GI: has mild tenderness in central abdomen, no rebound pain, no organomegaly, BS present. GU: No hematuria Ext: No pitting leg  edema bilaterally. 1+DP/PT pulse bilaterally. Musculoskeletal: Has tenderness to left shoulder, s/p of left knee replacement with clean surgical site, no erythema or redness. skin: No rashes.  Neuro: Alert, oriented X3, cranial nerves II-XII grossly intact, moves all extremities. Psych: Patient is not psychotic, no suicidal or hemocidal ideation.  Labs on Admission: I have personally reviewed following labs and imaging studies  CBC: Recent Labs  Lab 03/08/24 1149  WBC 17.9*  HGB 7.3*  HCT 23.0*  MCV 81.9  PLT 573*   Basic Metabolic Panel: Recent Labs  Lab 03/08/24 1149  NA 135  K 4.3  CL 102  CO2 22  GLUCOSE 68*  BUN 23  CREATININE 1.07*  CALCIUM 8.8*   GFR: Estimated Creatinine Clearance: 28.1 mL/min (A) (by C-G formula based on SCr of 1.07 mg/dL (H)). Liver Function Tests: No results for input(s): "AST", "ALT", "ALKPHOS", "BILITOT", "PROT", "ALBUMIN" in the last 168 hours. No results for input(s): "LIPASE", "AMYLASE" in the last 168 hours. No results for input(s): "AMMONIA" in the last 168 hours. Coagulation Profile: Recent Labs  Lab 03/08/24 2135  INR 1.1   Cardiac Enzymes: No results for input(s): "CKTOTAL", "CKMB", "CKMBINDEX", "TROPONINI" in the last 168 hours. BNP (last 3 results) No results for input(s): "PROBNP" in the last 8760 hours. HbA1C: No results for input(s): "HGBA1C" in the last 72 hours. CBG: No results for input(s): "GLUCAP" in the last 168 hours. Lipid Profile: No results for input(s): "CHOL", "HDL", "LDLCALC", "TRIG", "CHOLHDL", "LDLDIRECT" in the last 72 hours. Thyroid Function Tests: No results for input(s): "TSH", "T4TOTAL", "FREET4", "T3FREE", "THYROIDAB" in  the last 72 hours. Anemia Panel: Recent Labs    03/08/24 1148 03/08/24 2135  VITAMINB12  --  357  FOLATE 38.0  --   FERRITIN 285  --   TIBC 237*  --   IRON 35  --   RETICCTPCT  --  2.0   Urine analysis:    Component Value Date/Time   COLORURINE YELLOW (A) 03/09/2024  0041   APPEARANCEUR CLEAR (A) 03/09/2024 0041   LABSPEC 1.011 03/09/2024 0041   PHURINE 6.0 03/09/2024 0041   GLUCOSEU NEGATIVE 03/09/2024 0041   HGBUR SMALL (A) 03/09/2024 0041   BILIRUBINUR NEGATIVE 03/09/2024 0041   KETONESUR 5 (A) 03/09/2024 0041   PROTEINUR NEGATIVE 03/09/2024 0041   UROBILINOGEN 0.2 04/11/2011 1030   NITRITE NEGATIVE 03/09/2024 0041   LEUKOCYTESUR SMALL (A) 03/09/2024 0041   Sepsis Labs: @LABRCNTIP (procalcitonin:4,lacticidven:4) )No results found for this or any previous visit (from the past 240 hours).   Radiological Exams on Admission:   Assessment/Plan Principal Problem:   Symptomatic anemia Active Problems:   Iron deficiency anemia   History of total knee arthroplasty, left   Left shoulder pain   Nausea & vomiting   Colitis   Chronic diastolic CHF (congestive heart failure) (HCC)   Asthma without status asthmaticus   Essential hypertension   Acquired hypothyroidism   History of CVA (cerebrovascular accident)   HLD (hyperlipidemia)   Chronic kidney disease, stage 3a (HCC)   Assessment and Plan:   Symptomatic anemia and iron deficiency anemia: Hemoglobin dropped from 11.5 on 02/23/24 to 7.3.   Patient states she has hemorrhoid with intermittent bleeding, but no active bleeding today.  No dark stool today.  -will place in tele bed for obs -1 unit of blood was ordered for transfusion by EDP -Check anemia panel  Left shoulder pain: CT showed superior subluxation of the humeral head in relation to the glenoid compatible with thinning and probable partial tear of the supraspinatus. Moderate atrophy of the supraspinatus muscle. -As needed morphine, Percocet, Tylenol for pain -Lidoderm patch -As needed Robaxin -Sling to left shoulder -Consulted Dr. Joice Lofts of Ortho  History of total knee arthroplasty, left: -Pain control as above  Nausea & vomiting due to possible colitis: CT showed mild asymmetric wall thickening of the distal transverse colon,  indicating possible colitis.  Patient has a WBC 17.9, no fever, clinically not septic. -Started Zosyn -Blood culture -Check C. difficile if patient develops diarrhea again  Chronic diastolic CHF (congestive heart failure) (HCC): 2D echo on 11/24/2022 showed EF of 55-60%.  No leg edema or JVD.  CHF is compensated. -Check a BMP  Asthma without status asthmaticus: Stable -Singulair, bronchodilators and as needed Mucinex  Essential hypertension: Blood pressure soft 103/48. -IV hydralazine as needed -Hold home blood pressure medications including amlodipine and propranolol  Acquired hypothyroidism -Synthroid  History of CVA (cerebrovascular accident) -Continue aspirin, Lipitor -Hold Plavix due to worsening anemia  HLD (hyperlipidemia) -Lipitor  Chronic kidney disease, stage 3a (HCC): Renal function stable. -Follow-up by BMP      DVT ppx: SCD  Code Status: DNR   Family Communication:  Yes, patient's husband  at bed side.   Disposition Plan:  Anticipate discharge back to previous environment  Consults called:  Dr. Joice Lofts of ortho  Admission status and Level of care: Telemetry Medical:    for obs     Dispo: The patient is from: Home              Anticipated d/c is to: Home  Anticipated d/c date is: 1 day              Patient currently is not medically stable to d/c.    Severity of Illness:  The appropriate patient status for this patient is OBSERVATION. Observation status is judged to be reasonable and necessary in order to provide the required intensity of service to ensure the patient's safety. The patient's presenting symptoms, physical exam findings, and initial radiographic and laboratory data in the context of their medical condition is felt to place them at decreased risk for further clinical deterioration. Furthermore, it is anticipated that the patient will be medically stable for discharge from the hospital within 2 midnights of admission.         Date of Service 03/09/2024    Lorretta Harp [facility]   If 7PM-7AM, please contact night-coverage [email or web address] 03/09/2024, 1:53 AM

## 2024-03-08 NOTE — ED Triage Notes (Signed)
 Arrived by [facility] from home. Recent left knee surgery. Husband reports little po intake. Saturday multiple episodes of emesis and refusing to take medication for a few days.   Before surgery had to receive blood transfusion due to anemia  Deficits to left arm from stroke  EMS vitals:  77HR 98% RA 116/70 b/p

## 2024-03-08 NOTE — ED Provider Notes (Signed)
 [facility] Provider Note    Event Date/Time   First MD Initiated Contact with Patient 03/08/24 1537     (approximate)   History   Weakness   HPI  [name] is a 83 y.o. female with history of anemia, hypertension, chronic pancreatitis, and recent left knee total arthroplasty on 3/26 who presents with generalized weakness, decreased p.o. intake, vomiting, and persistent severe pain.  The patient reports pain in the left knee that was operated on and that is not relieved by tramadol.  She also has new left shoulder pain which is not associated with any specific trauma.  She has had vomiting over the last few days although her husband states that she has not vomited today.  The patient has not had any fever.  She has no chest pain or shortness of breath.  She states that she is very upset, does not feel well, and is embarrassed to have to be brought in by EMS.  I reviewed the past medical records including the discharge summary from the patient's admission last week.  The patient was also seen by Dr. Orlie Dakin from hematology on 2/26 for follow-up of her iron deficiency anemia and has received IV iron in the past.   Physical Exam   Triage Vital Signs: ED Triage Vitals  Encounter Vitals Group     BP 03/08/24 1135 (!) 104/44     Systolic BP Percentile --      Diastolic BP Percentile --      Pulse Rate 03/08/24 1135 65     Resp 03/08/24 1135 20     Temp 03/08/24 1137 98.2 F (36.8 C)     Temp Source 03/08/24 1137 Oral     SpO2 03/08/24 1135 98 %     Weight --      Height --      Head Circumference --      Peak Flow --      Pain Score 03/08/24 1135 6     Pain Loc --      Pain Education --      Exclude from Growth Chart --     Most recent vital signs: Vitals:   03/08/24 1831 03/08/24 2027  BP: (!) 103/48 (!) 109/55  Pulse: 78 79  Resp: 17 18  Temp: 98 F (36.7 C) 99 F (37.2 C)  SpO2: 100% 100%     General: Alert, uncomfortable appearing, no  acute distress.  CV:  Good peripheral perfusion.  Resp:  Normal effort.  Abd:  No distention.  Other:  Left knee with dressing in place.  No surrounding erythema, induration, or abnormal warmth.  ROM limited due to pain, however the patient is able to flex the knee to about 120 degrees and almost completely straighten it.  Left shoulder with mild diffuse tenderness.  No swelling.  No skin findings.  Pain on ROM.  No deformity.  Patient appears somewhat pale.  Mucous membranes are dry.  Motor intact in all extremities.  Normal speech.   ED Results / Procedures / Treatments   Labs (all labs ordered are listed, but only abnormal results are displayed) Labs Reviewed  BASIC METABOLIC PANEL WITH GFR - Abnormal; Notable for the following components:      Result Value   Glucose, Bld 68 (*)    Creatinine, Ser 1.07 (*)    Calcium 8.8 (*)    GFR, Estimated 52 (*)    All other components within normal limits  CBC - Abnormal; Notable for the following components:   WBC 17.9 (*)    RBC 2.81 (*)    Hemoglobin 7.3 (*)    HCT 23.0 (*)    RDW 23.6 (*)    Platelets 573 (*)    All other components within normal limits  BRAIN NATRIURETIC PEPTIDE - Abnormal; Notable for the following components:   B Natriuretic Peptide 143.6 (*)    All other components within normal limits  IRON AND TIBC - Abnormal; Notable for the following components:   TIBC 237 (*)    All other components within normal limits  RETICULOCYTES - Abnormal; Notable for the following components:   RBC. 3.27 (*)    Immature Retic Fract 46.8 (*)    All other components within normal limits  LACTIC ACID, PLASMA  LACTIC ACID, PLASMA  FOLATE  FERRITIN  APTT  PROTIME-INR  URINALYSIS, ROUTINE W REFLEX MICROSCOPIC  VITAMIN B12  BASIC METABOLIC PANEL WITH GFR  CBG MONITORING, ED  PREPARE RBC (CROSSMATCH)  TYPE AND SCREEN  TROPONIN I (HIGH SENSITIVITY)  TROPONIN I (HIGH SENSITIVITY)     EKG  ED ECG REPORT I, Dionne Bucy,  the attending physician, personally viewed and interpreted this ECG.  Date: 03/08/2024 EKG Time: 1138 Rate: 70 Rhythm: normal sinus rhythm QRS Axis: normal Intervals: normal ST/T Wave abnormalities: normal Narrative Interpretation: no evidence of acute ischemia    RADIOLOGY  XR L shoulder: I independently viewed and interpreted the images; no displaced fractures.  Radiology report indicates possible nondisplaced acromion fracture.  PROCEDURES:  Critical Care performed: No  Procedures   MEDICATIONS ORDERED IN ED: Medications  albuterol (PROVENTIL) (2.5 MG/3ML) 0.083% nebulizer solution 2.5 mL (has no administration in time range)  dextromethorphan-guaiFENesin (MUCINEX DM) 30-600 MG per 12 hr tablet 1 tablet (has no administration in time range)  ondansetron (ZOFRAN) injection 4 mg (has no administration in time range)  hydrALAZINE (APRESOLINE) injection 5 mg (has no administration in time range)  acetaminophen (TYLENOL) tablet 650 mg (has no administration in time range)  dextrose 50 % solution 50 mL (has no administration in time range)  morphine (PF) 2 MG/ML injection 1 mg (has no administration in time range)  oxyCODONE-acetaminophen (PERCOCET/ROXICET) 5-325 MG per tablet 1 tablet (1 tablet Oral Given 03/08/24 2046)  methocarbamol (ROBAXIN) tablet 500 mg (has no administration in time range)  lidocaine (LIDODERM) 5 % 1 patch (has no administration in time range)  iohexol (OMNIPAQUE) 9 MG/ML oral solution 500 mL (0 mLs Oral Hold 03/08/24 2201)  sodium chloride 0.9 % bolus 1,000 mL (0 mLs Intravenous Stopped 03/08/24 1802)  ondansetron (ZOFRAN) injection 4 mg (4 mg Intravenous Given 03/08/24 1638)  morphine (PF) 4 MG/ML injection 4 mg (4 mg Intravenous Given 03/08/24 1639)  0.9 %  sodium chloride infusion (10 mL/hr Intravenous New Bag/Given 03/08/24 1816)     IMPRESSION / MDM / ASSESSMENT AND PLAN / ED COURSE  I reviewed the triage vital signs and the nursing notes.  83 year old  female with PMH as noted above presents with worsening generalized weakness, vomiting, decreased p.o. intake, and persistent pain not relieved by her oral pain medications.  On exam her vital signs are normal except for borderline low blood pressure.  Neurologic exam is nonfocal.  Throughout my history, the patient appears quite upset that she is here and is somewhat argumentative although on further discussion with her and the husband, it seems that she is just very frustrated with her experience and feels embarrassed about having  had to be brought to the hospital.  Differential diagnosis includes, but is not limited to, dehydration, electrolyte abnormality, AKI, other metabolic disturbance, recurrent worsening anemia, acute infection.  There is no evidence of a joint infection related to the surgery.  Initial lab workup reveals a hemoglobin of 7.3, leukocytosis, as well as BMP with no significant findings.  I have added on troponin, lactate, urine, shoulder x-ray and have ordered fluids, unit of PRBCs.    Patient's presentation is most consistent with acute presentation with potential threat to life or bodily function.  The patient is on the cardiac monitor to evaluate for evidence of arrhythmia and/or significant heart rate changes.  ----------------------------------------- 6:51 PM on 03/08/2024 -----------------------------------------  Lactate is not elevated.  X-ray of the shoulder shows a possible acromion fracture so I ordered a CT for further evaluation.  The patient's pain is significantly improved.  She will need admission for further management.  I consulted Dr. Clyde Lundborg from the hospitalist service; based on our discussion he agrees to evaluate the patient for admission.  FINAL CLINICAL IMPRESSION(S) / ED DIAGNOSES   Final diagnoses:  Symptomatic anemia  Post-operative pain     Rx / DC Orders   ED Discharge Orders     None        Note:  This document was prepared using Dragon  voice recognition software and may include unintentional dictation errors.    Dionne Bucy, MD 03/08/24 2337

## 2024-03-09 DIAGNOSIS — M7582 Other shoulder lesions, left shoulder: Secondary | ICD-10-CM | POA: Diagnosis not present

## 2024-03-09 DIAGNOSIS — D509 Iron deficiency anemia, unspecified: Secondary | ICD-10-CM | POA: Diagnosis not present

## 2024-03-09 DIAGNOSIS — K529 Noninfective gastroenteritis and colitis, unspecified: Secondary | ICD-10-CM | POA: Diagnosis present

## 2024-03-09 DIAGNOSIS — D649 Anemia, unspecified: Secondary | ICD-10-CM | POA: Diagnosis not present

## 2024-03-09 DIAGNOSIS — M12812 Other specific arthropathies, not elsewhere classified, left shoulder: Secondary | ICD-10-CM | POA: Diagnosis not present

## 2024-03-09 DIAGNOSIS — N1831 Chronic kidney disease, stage 3a: Secondary | ICD-10-CM | POA: Diagnosis not present

## 2024-03-09 DIAGNOSIS — M25512 Pain in left shoulder: Secondary | ICD-10-CM | POA: Diagnosis not present

## 2024-03-09 LAB — BASIC METABOLIC PANEL WITH GFR
Anion gap: 10 (ref 5–15)
BUN: 17 mg/dL (ref 8–23)
CO2: 22 mmol/L (ref 22–32)
Calcium: 8.6 mg/dL — ABNORMAL LOW (ref 8.9–10.3)
Chloride: 103 mmol/L (ref 98–111)
Creatinine, Ser: 1.03 mg/dL — ABNORMAL HIGH (ref 0.44–1.00)
GFR, Estimated: 55 mL/min — ABNORMAL LOW (ref >60)
Glucose, Bld: 50 mg/dL — ABNORMAL LOW (ref 70–99)
Potassium: 3.8 mmol/L (ref 3.5–5.1)
Sodium: 135 mmol/L (ref 135–145)

## 2024-03-09 LAB — URINALYSIS, ROUTINE W REFLEX MICROSCOPIC
Bacteria, UA: NONE SEEN
Bilirubin Urine: NEGATIVE
Glucose, UA: NEGATIVE mg/dL
Ketones, ur: 5 mg/dL — AB
Nitrite: NEGATIVE
Protein, ur: NEGATIVE mg/dL
Specific Gravity, Urine: 1.011 (ref 1.005–1.030)
pH: 6 (ref 5.0–8.0)

## 2024-03-09 LAB — CBC
HCT: 29.3 % — ABNORMAL LOW (ref 36.0–46.0)
Hemoglobin: 9.4 g/dL — ABNORMAL LOW (ref 12.0–15.0)
MCH: 26.7 pg (ref 26.0–34.0)
MCHC: 32.1 g/dL (ref 30.0–36.0)
MCV: 83.2 fL (ref 80.0–100.0)
Platelets: 520 10*3/uL — ABNORMAL HIGH (ref 150–400)
RBC: 3.52 MIL/uL — ABNORMAL LOW (ref 3.87–5.11)
RDW: 20.3 % — ABNORMAL HIGH (ref 11.5–15.5)
WBC: 15.3 10*3/uL — ABNORMAL HIGH (ref 4.0–10.5)
nRBC: 0 % (ref 0.0–0.2)

## 2024-03-09 LAB — BPAM RBC
Blood Product Expiration Date: 202504272359
ISSUE DATE / TIME: 202504011813
Unit Type and Rh: 5100

## 2024-03-09 LAB — TYPE AND SCREEN
ABO/RH(D): O POS
Antibody Screen: NEGATIVE
Unit division: 0

## 2024-03-09 MED ORDER — LEVOTHYROXINE SODIUM 50 MCG PO TABS
50.0000 ug | ORAL_TABLET | Freq: Every day | ORAL | Status: DC
  Administered 2024-03-10: 50 ug via ORAL
  Filled 2024-03-09 (×2): qty 1

## 2024-03-09 MED ORDER — VITAMIN D 25 MCG (1000 UNIT) PO TABS
5000.0000 [IU] | ORAL_TABLET | Freq: Every day | ORAL | Status: DC
  Administered 2024-03-09 – 2024-03-10 (×2): 5000 [IU] via ORAL
  Filled 2024-03-09 (×2): qty 5

## 2024-03-09 MED ORDER — GABAPENTIN 300 MG PO CAPS
300.0000 mg | ORAL_CAPSULE | Freq: Two times a day (BID) | ORAL | Status: DC
  Administered 2024-03-09 – 2024-03-10 (×3): 300 mg via ORAL
  Filled 2024-03-09 (×3): qty 1

## 2024-03-09 MED ORDER — AMOXICILLIN-POT CLAVULANATE 500-125 MG PO TABS
1.0000 | ORAL_TABLET | Freq: Two times a day (BID) | ORAL | Status: DC
  Administered 2024-03-09 – 2024-03-10 (×2): 1 via ORAL
  Filled 2024-03-09 (×2): qty 1

## 2024-03-09 MED ORDER — MONTELUKAST SODIUM 10 MG PO TABS
10.0000 mg | ORAL_TABLET | Freq: Every day | ORAL | Status: DC
  Administered 2024-03-09: 10 mg via ORAL
  Filled 2024-03-09: qty 1

## 2024-03-09 MED ORDER — CYANOCOBALAMIN 1000 MCG/ML IJ SOLN
1000.0000 ug | Freq: Once | INTRAMUSCULAR | Status: AC
  Administered 2024-03-09: 1000 ug via INTRAMUSCULAR
  Filled 2024-03-09: qty 1

## 2024-03-09 MED ORDER — ATORVASTATIN CALCIUM 20 MG PO TABS
40.0000 mg | ORAL_TABLET | Freq: Every day | ORAL | Status: DC
  Administered 2024-03-09 – 2024-03-10 (×2): 40 mg via ORAL
  Filled 2024-03-09 (×2): qty 2

## 2024-03-09 MED ORDER — ASPIRIN 81 MG PO TBEC
81.0000 mg | DELAYED_RELEASE_TABLET | Freq: Two times a day (BID) | ORAL | Status: DC
  Administered 2024-03-09 – 2024-03-10 (×3): 81 mg via ORAL
  Filled 2024-03-09 (×3): qty 1

## 2024-03-09 MED ORDER — FERROUS SULFATE 325 (65 FE) MG PO TABS
325.0000 mg | ORAL_TABLET | Freq: Every day | ORAL | Status: DC
  Administered 2024-03-09 – 2024-03-10 (×2): 325 mg via ORAL
  Filled 2024-03-09 (×2): qty 1

## 2024-03-09 MED ORDER — ADULT MULTIVITAMIN W/MINERALS CH
1.0000 | ORAL_TABLET | Freq: Every day | ORAL | Status: DC
  Administered 2024-03-09: 1 via ORAL
  Filled 2024-03-09 (×2): qty 1

## 2024-03-09 MED ORDER — PIPERACILLIN-TAZOBACTAM 3.375 G IVPB
3.3750 g | Freq: Three times a day (TID) | INTRAVENOUS | Status: DC
  Administered 2024-03-09 (×2): 3.375 g via INTRAVENOUS
  Filled 2024-03-09 (×2): qty 50

## 2024-03-09 MED ORDER — PANTOPRAZOLE SODIUM 40 MG PO TBEC
40.0000 mg | DELAYED_RELEASE_TABLET | Freq: Every day | ORAL | Status: DC
  Administered 2024-03-09 – 2024-03-10 (×2): 40 mg via ORAL
  Filled 2024-03-09 (×2): qty 1

## 2024-03-09 NOTE — Evaluation (Signed)
 Occupational Therapy Evaluation Patient Details Name: [name] MRN: [id number] DOB: 1941-02-13 Today's Date: 03/09/2024   History of Present Illness   Pt is an 83 year old female s/p L TKA 03/02/24    PMH significant for CVA with L sided weakness, R TKA     Clinical Impressions Patient received for OT evaluation. See flowsheet below for details of function. Generally, patient requiring +2 assist for transfer to toilet and assistance with all ADLs.  Patient will benefit from continued OT while in acute care.      If plan is discharge home, recommend the following:   Two people to help with walking and/or transfers;Two people to help with bathing/dressing/bathroom;Direct supervision/assist for medications management;Direct supervision/assist for financial management;Assistance with cooking/housework;Assist for transportation;Help with stairs or ramp for entrance;Supervision due to cognitive status     Functional Status Assessment   Patient has had a recent decline in their functional status and demonstrates the ability to make significant improvements in function in a reasonable and predictable amount of time.     Equipment Recommendations   None recommended by OT     Recommendations for Other Services         Precautions/Restrictions   Precautions Precautions: Fall Recall of Precautions/Restrictions: Impaired Restrictions Weight Bearing Restrictions Per Provider Order: No LLE Weight Bearing Per Provider Order: Weight bearing as tolerated     Mobility Bed Mobility Overal bed mobility: Needs Assistance Bed Mobility: Supine to Sit, Sit to Supine     Supine to sit:  (PT assisting pt, see PT note for details.) Sit to supine: Mod assist        Transfers Overall transfer level: Needs assistance Equipment used: 2 person hand held assist Transfers: Bed to chair/wheelchair/BSC Sit to Stand: Mod assist     Step pivot transfers: Mod assist, Max assist, +2  physical assistance     General transfer comment: poor balance and lots of cues needed      Balance Overall balance assessment: Needs assistance Sitting-balance support: Feet supported Sitting balance-Leahy Scale: Fair     Standing balance support: Bilateral upper extremity supported Standing balance-Leahy Scale: Zero Standing balance comment: +2 assist                           ADL either performed or assessed with clinical judgement   ADL Overall ADL's : Needs assistance/impaired                     Lower Body Dressing: Total assistance;+2 for physical assistance Lower Body Dressing Details (indicate cue type and reason): Required total assist for donning new underwear and a second person for helping pt maintain standing balance while OT assisting with pulling up underwear over hips. Toilet Transfer: Moderate assistance;Maximal assistance;+2 for physical assistance Toilet Transfer Details (indicate cue type and reason): t/f to [facility] next to bed with handheld assist Toileting- Clothing Manipulation and Hygiene: Total assistance       Functional mobility during ADLs: Maximal assistance General ADL Comments: Pt very off-balance; requiring x2 assist for safe transfer to toilet. Pt impulsive and drowsy, requires lots of cues for participation. Pt is very weak and at high risk for falls without +2 assistance; discussed with husband and he understands risks and still desires to take pt home with Home Health support.     Vision Patient Visual Report: No change from baseline       Perception  Praxis         Pertinent Vitals/Pain Pain Assessment Pain Assessment: 0-10 Pain Score:  (unrated, high) Pain Location: L knee and L shoulder. Pain Descriptors / Indicators: Discomfort Pain Intervention(s): Limited activity within patient's tolerance     Extremity/Trunk Assessment Upper Extremity Assessment Upper Extremity Assessment: Right hand  dominant;LUE deficits/detail LUE Deficits / Details: Pt has baseline L sided weakess. AROM shoulder flexion very limited and painful/tight; elbow extension limited to approx 90 degrees then impaired by tone.   Lower Extremity Assessment Lower Extremity Assessment: Defer to PT evaluation;Generalized weakness;LLE deficits/detail LLE Deficits / Details: recent L TKA 3/26. Lots of pain with mobility.       Communication Communication Communication: No apparent difficulties   Cognition Arousal: Alert Behavior During Therapy: Impulsive Cognition: History of cognitive impairments, Cognition impaired   Orientation impairments: Situation, Time         OT - Cognition Comments: Pt has baseline cognitive deficits from stroke. Appears more drowsy than baseline today, often closing eyes; pt was given morphine earlier this morning.                 Following commands: Impaired Following commands impaired: Follows one step commands inconsistently     Cueing  General Comments   Cueing Techniques: Verbal cues;Tactile cues;Visual cues  Pt on room air; RN in room for most of session. On room air.   Exercises     Shoulder Instructions      Home Living Family/patient expects to be discharged to:: Private residence Living Arrangements: Spouse/significant other Available Help at Discharge: Family;Available 24 hours/day Type of Home: House Home Access: Stairs to enter [employer] of Steps: 2- back has 6 steps with B rails husband is always near/holds hand Entrance Stairs-Rails: Right;Left Home Layout: Able to live on main level with bedroom/bathroom;Two level     Bathroom Shower/Tub: Producer, television/film/video: Handicapped height Bathroom Accessibility: Yes How Accessible: Accessible via walker Home Equipment: Agricultural consultant (2 wheels);Shower seat;Grab bars - toilet;Grab bars - tub/shower;BSC/3in1;Wheelchair - manual;Cane - single point;Toilet riser   Additional  Comments: Husband states he is home nearly 24/7 to assist; has no other local friends/family to assist with ADL/IADL.      Prior Functioning/Environment Prior Level of Function : Needs assist             Mobility Comments: At baseline: 3 wheel rollator- but uses RW now, very limited distance prior to surgery due to pain. spouse assisted with mobility as needed, including going up and down the steps. Since knee surgery, has had one PT session, but then N/V symptoms began and pt not eating well. ADLs Comments: assist for ADLs (including dressing, bathing), feed herself and performs grooming with PRN assist; assist for IADLs    OT Problem List: Decreased activity tolerance;Decreased strength;Impaired balance (sitting and/or standing);Decreased cognition;Decreased knowledge of precautions;Decreased safety awareness;Decreased knowledge of use of DME or AE   OT Treatment/Interventions: Self-care/ADL training;DME and/or AE instruction;Therapeutic activities;Balance training;Therapeutic exercise;Energy conservation;Patient/family education;Cognitive remediation/compensation      OT Goals(Current goals can be found in the care plan section)   Acute Rehab OT Goals Patient Stated Goal: go home; pain to stop OT Goal Formulation: With patient/family Time For Goal Achievement: 03/23/24 Potential to Achieve Goals: Fair ADL Goals Pt Will Perform Grooming: with min assist;sitting Pt Will Perform Upper Body Dressing: with min assist;sitting Pt Will Perform Lower Body Dressing: with min assist;sit to/from stand Pt Will Transfer to Toilet: with min assist;stand pivot transfer  Pt Will Perform Toileting - Clothing Manipulation and hygiene: with min assist;sit to/from stand   OT Frequency:  Min 2X/week    Co-evaluation PT/OT/SLP Co-Evaluation/Treatment: Yes Reason for Co-Treatment: Complexity of the patient's impairments (multi-system involvement);For patient/therapist safety;To address  functional/ADL transfers   OT goals addressed during session: ADL's and self-care      AM-PAC OT "6 Clicks" Daily Activity     Outcome Measure Help from another person eating meals?: A Little Help from another person taking care of personal grooming?: A Little Help from another person toileting, which includes using toliet, bedpan, or urinal?: Total Help from another person bathing (including washing, rinsing, drying)?: A Lot Help from another person to put on and taking off regular upper body clothing?: A Lot Help from another person to put on and taking off regular lower body clothing?: Total 6 Click Score: 12   End of Session Equipment Utilized During Treatment:  [facility]) Nurse Communication: Mobility status  Activity Tolerance: Patient limited by fatigue Patient left: in bed;with call bell/phone within reach;with bed alarm set;with nursing/sitter in room  OT Visit Diagnosis: Other abnormalities of gait and mobility (R26.89);Muscle weakness (generalized) (M62.81);Unsteadiness on feet (R26.81)                Time: 4098-1191 OT Time Calculation (min): 40 min Charges:  OT General Charges $OT Visit: 1 Visit OT Evaluation $OT Eval Moderate Complexity: 1 Mod OT Treatments $Self Care/Home Management : 8-22 mins  Linward Foster, MS, OTR/L  Alvester Morin 03/09/2024, 11:59 AM

## 2024-03-09 NOTE — Progress Notes (Signed)
 Progress Note   Patient: [name] ZOX:[id number] DOB: [birth year] DOA: 03/08/2024     0 DOS: the patient was seen and examined on 03/09/2024   Brief hospital course:  [name] is a 83 y.o. 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  Anemia with hemoglobin 7.3, received 1 unit PRBC, increased to 9.4. 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     Principal Problem:   Symptomatic anemia Active Problems:   Iron deficiency anemia   History of total knee arthroplasty, left   Left shoulder pain   Nausea & vomiting   Colitis   Chronic diastolic CHF (congestive heart failure) (HCC)   Asthma without status asthmaticus   Essential hypertension   Acquired hypothyroidism   History of CVA (cerebrovascular accident)   HLD (hyperlipidemia)   Chronic kidney disease, stage 3a (HCC)   Assessment and Plan:  Symptomatic anemia and iron deficiency anemia Reactive thrombocytosis.: Hemoglobin dropped from 11.5 on 02/23/24 to 7.3.    She received 1 unit PRBC, hemoglobin has improved to 9.4.  Left shoulder pain: CT showed superior subluxation of the humeral head in relation to the glenoid compatible with thinning and probable partial tear of the supraspinatus. Moderate atrophy of the supraspinatus muscle. Patient will be seen by Dr. Joice Lofts today, Unlikely require surgery.  Continue symptomatic treatment.  Hypoglycemia secondary to n.p.o. status. Started diet again today, recheck BMP tomorrow.   History of total  knee arthroplasty, left: -Pain control as above   Nausea & vomiting due to possible colitis: CT showed mild asymmetric wall thickening of the distal transverse colon, indicating possible colitis.  Patient has a WBC 17.9, no fever, clinically not septic. Diarrhea is better today, changed to oral Augmentin.   Chronic diastolic CHF (congestive heart failure) (HCC): 2D echo on 11/24/2022 showed EF of 55-60%.  No leg edema or JVD.  CHF is compensated. Exacerbation.   Asthma without status asthmaticus: Stable -Singulair, bronchodilators and as needed Mucinex   Essential hypertension: BP still not significant elevated, may consider restart BP medicine tomorrow if blood pressure elevated again.   Acquired hypothyroidism -Synthroid   History of CVA (cerebrovascular accident) -Continue aspirin, Lipitor -Hold Plavix due to worsening anemia   HLD (hyperlipidemia) -Lipitor   Chronic kidney disease, stage 3a (HCC): Renal function stable. -Follow-up by BMP         Subjective:  Doing well today, no nausea vomiting.  No additional diarrhea.  Physical Exam: Vitals:   03/09/24 0021 03/09/24 0422 03/09/24 0804 03/09/24 1137  BP: (!) 114/49 114/84 (!) 127/54 (!) 127/55  Pulse: 79 81 75 71  Resp: 18 19 16 20   Temp: 98.8 F (37.1 C) 98.6 F (37 C) 98.1 F (36.7 C) 97.8 F (36.6 C)  TempSrc:      SpO2: 100% 96% 100% 100%  Weight:      Height:       General exam: Appears calm and comfortable  Respiratory system: Clear to auscultation. Respiratory effort normal. Cardiovascular system: S1 & S2 heard, RRR. No JVD, murmurs, rubs, gallops or clicks. No pedal edema. Gastrointestinal system: Abdomen  is nondistended, soft and nontender. No organomegaly or masses felt. Normal bowel sounds heard. Central nervous system: Alert and oriented. No focal neurological deficits. Extremities: Symmetric 5 x 5 power. Skin: No rashes, lesions or ulcers Psychiatry: Judgement and insight appear normal.  Mood & affect appropriate.    Data Reviewed:  CT scan and the lab results reviewed.  Family Communication: Husband updated at bedside.  Disposition: Status is: Observation      Time spent: 35 minutes  Author: Marrion Coy, MD 03/09/2024 3:43 PM  For on call review [email or web address].

## 2024-03-09 NOTE — Care Management Obs Status (Signed)
 MEDICARE OBSERVATION STATUS NOTIFICATION   Patient Details  Name: [name] MRN: [id number] Date of Birth: February 18, 1941   Medicare Observation Status Notification Given:       Colin Broach, LCSW 03/09/2024, 6:10 PM

## 2024-03-09 NOTE — Progress Notes (Signed)
 Pharmacy Antibiotic Note  [name] is a 83 y.o. female admitted on 03/08/2024 with possible colitis.  Pharmacy has been consulted for Zosyn dosing.  Plan: Zosyn 3.375g IV q8h (4 hour infusion).  Pharmacy will continue to follow and will adjust abx dosing whenever warranted.  Temp (24hrs), Avg:98.5 F (36.9 C), Min:98 F (36.7 C), Max:99 F (37.2 C)   Recent Labs  Lab 03/08/24 1149 03/08/24 1629 03/08/24 1807  WBC 17.9*  --   --   CREATININE 1.07*  --   --   LATICACIDVEN  --  1.2 0.8    Estimated Creatinine Clearance: 28.1 mL/min (A) (by C-G formula based on SCr of 1.07 mg/dL (H)).    Allergies  Allergen Reactions   Cinobac [Cinoxacin] Anaphylaxis    "My throat swelled and I stopped breathing."   Latex Dermatitis    IgE = 14 (WNL) on 04/17/2022  Pt is highly reactive to any latex products. Blisters skin. "Takes my skin off"  "Skin will tears off"  IgE = 14 (WNL) on 04/17/2022  Pt is highly reactive to any latex products. Blisters skin. "Takes my skin off"   Other Shortness Of Breath    Muscle relaxers? Pt thinks allergic to muscle relaxers, reports stops breathing but not sure what medicine and if for sure muscle relaxer  Petroleum, Chemicals, Paints, Muscle Relaxers   Petrolatum Distillates [Petroleum Distillate] Shortness Of Breath    Passes out    Sulfa Antibiotics Itching   Meloxicam     Other Reaction(s): Not available, Unknown   Misc. Sulfonamide Containing Compounds Other (See Comments)   Nylon Itching and Rash    Antimicrobials this admission: 04/02 Zosyn >>   Microbiology results: 04/02 BCx: Pending 04/02 C. Diff:  Pending   Thank you for allowing pharmacy to be a part of this patient's care.  [name], PharmD, [facility] 03/09/2024 2:19 AM

## 2024-03-09 NOTE — Evaluation (Signed)
 Physical Therapy Evaluation Patient Details Name: [name] MRN: [id number] DOB: 15-Jul-1941 Today's Date: 03/09/2024  History of Present Illness  Pt admitted for symptomatic anemia with complaints of N/V and abdominal pain. 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  Clinical Impression  PT/OT co-evaluation performed this date. Pt is a pleasant 83 year old female who was admitted for symptomatic anemia. Pt performs bed mobility/transfers with mod assist +2 and unsafe to ambulate at this time. Pt demonstrates deficits with cognition/mobility/strength. Pt with increased stiffness in L hemibody, L UE/LE stretching performed and proper positioning noted. Very unsteady with all OOB, recommend +2 at this time. Discussed with spouse at bedside and he is aware of assistance she will need in home environment. He is hopeful to take her home and provide care for her at time of discharge. Would benefit from skilled PT to address above deficits and promote optimal return to PLOF. Pt will continue to receive skilled PT services while admitted and will defer to TOC/care team for updates regarding disposition planning.       If plan is discharge home, recommend the following: A lot of help with walking and/or transfers;A lot of help with bathing/dressing/bathroom;Supervision due to cognitive status;Help with stairs or ramp for entrance;Assistance with cooking/housework;Assist for transportation   Can travel by private vehicle        Equipment Recommendations None recommended by PT  Recommendations for Other Services       Functional Status Assessment Patient has had a recent decline in their functional status and demonstrates the ability to make significant improvements in function in a reasonable and predictable amount of time.     Precautions / Restrictions Precautions Precautions: Fall Recall of Precautions/Restrictions:  Impaired Restrictions Weight Bearing Restrictions Per Provider Order: No LLE Weight Bearing Per Provider Order: Weight bearing as tolerated Other Position/Activity Restrictions: per order, L shoulder sling ordered, not in room at this time, still pending ortho note. Limited WBing due to pain      Mobility  Bed Mobility Overal bed mobility: Needs Assistance Bed Mobility: Supine to Sit, Sit to Supine     Supine to sit: Mod assist, HOB elevated, Used rails Sit to supine: Mod assist   General bed mobility comments: needs heavy assist for mobility intiation. Once seated, poor static/dynamic balance noted with mod assist for balance recovery. When returned to bed, pt needs +2 for repositioning including pillows placed under L UE for proper positioning.    Transfers Overall transfer level: Needs assistance Equipment used: 2 person hand held assist Transfers: Bed to chair/wheelchair/BSC Sit to Stand: Mod assist, +2 physical assistance Stand pivot transfers: Mod assist, +2 physical assistance         General transfer comment: needs heavy cues. Pt with decreased safety awareness and post leaning during transfer attempt. Increased pain with WBing on L LE. Maintained NWB on L UE at this time    Ambulation/Gait               General Gait Details: not safe to perform this date due to cognition/pain  Stairs            Wheelchair Mobility     Tilt Bed    Modified Rankin (Stroke Patients Only)       Balance Overall balance assessment: Needs assistance Sitting-balance support: Feet supported Sitting balance-Leahy Scale: Poor     Standing balance support: Bilateral upper extremity supported Standing  balance-Leahy Scale: Zero                               Pertinent Vitals/Pain Pain Assessment Pain Assessment: 0-10 Pain Score: 10-Worst pain ever Pain Location: L knee and L shoulder. Pain Descriptors / Indicators: Discomfort Pain Intervention(s):  Limited activity within patient's tolerance, Premedicated before session, Repositioned    Home Living Family/patient expects to be discharged to:: Private residence Living Arrangements: Spouse/significant other Available Help at Discharge: Family;Available 24 hours/day Type of Home: House Home Access: Stairs to enter Entrance Stairs-Rails: Doctor, general practice of Steps: 2- back has 6 steps with B rails husband is always near/holds hand   Home Layout: Able to live on main level with bedroom/bathroom;Two level Home Equipment: Agricultural consultant (2 wheels);Shower seat;Grab bars - toilet;Grab bars - tub/shower;BSC/3in1;Wheelchair - manual;Cane - single point;Toilet riser Additional Comments: Husband states he is home nearly 24/7 to assist; has no other local friends/family to assist with ADL/IADL.    Prior Function Prior Level of Function : Needs assist             Mobility Comments: At baseline: 3 wheel rollator- but uses RW now, very limited distance prior to surgery due to pain. spouse assisted with mobility as needed, including going up and down the steps. Since knee surgery, has had one PT session, but then N/V symptoms began and pt not eating well. ADLs Comments: assist for ADLs (including dressing, bathing), feed herself and performs grooming with PRN assist; assist for IADLs     Extremity/Trunk Assessment   Upper Extremity Assessment Upper Extremity Assessment: Generalized weakness (L UE weakness noted secondary to residual CVA- stiffness and posturing noted. Able to attain good PROM although pt resistive) LUE Deficits / Details: Pt has baseline L sided weakess. AROM shoulder flexion very limited and painful/tight; elbow extension limited to approx 90 degrees then impaired by tone.    Lower Extremity Assessment Lower Extremity Assessment: Generalized weakness (L LE grossly 3/5; R LE grossly 3+/5) LLE Deficits / Details: recent L TKA 3/26. Lots of pain with mobility.        Communication   Communication Communication: No apparent difficulties    Cognition Arousal: Alert Behavior During Therapy: Impulsive   PT - Cognitive impairments: Orientation                       PT - Cognition Comments: very confused and has trouble following commands. Difficulty maintaining alertness, often following asleep mid sentence Following commands: Impaired Following commands impaired: Follows one step commands inconsistently     Cueing Cueing Techniques: Verbal cues, Tactile cues, Visual cues     General Comments General comments (skin integrity, edema, etc.): Pt on room air; RN in room for most of session. On room air.    Exercises Total Joint Exercises Goniometric ROM: L knee 5-90 degrees with towel roll under L heel Other Exercises Other Exercises: Pt with transfer from bed<>BSC with mod A+2. Needs assist for donning/doffing underwear and hygiene. Able to have continient void on BSC.   Assessment/Plan    PT Assessment Patient needs continued PT services  PT Problem List Decreased strength;Decreased range of motion;Decreased balance;Decreased mobility;Decreased cognition;Decreased knowledge of use of DME;Decreased safety awareness;Decreased knowledge of precautions;Pain       PT Treatment Interventions DME instruction;Gait training;Stair training;Functional mobility training;Therapeutic activities;Therapeutic exercise;Balance training;Neuromuscular re-education;Cognitive remediation;Patient/family education;Wheelchair mobility training    PT Goals (Current goals can be found  in the Care Plan section)  Acute Rehab PT Goals Patient Stated Goal: to go home with home health PT PT Goal Formulation: With patient/family Time For Goal Achievement: 03/23/24 Potential to Achieve Goals: Fair    Frequency Min 3X/week     Co-evaluation PT/OT/SLP Co-Evaluation/Treatment: Yes Reason for Co-Treatment: Complexity of the patient's impairments  (multi-system involvement);For patient/therapist safety;To address functional/ADL transfers PT goals addressed during session: Mobility/safety with mobility OT goals addressed during session: ADL's and self-care       AM-PAC PT "6 Clicks" Mobility  Outcome Measure Help needed turning from your back to your side while in a flat bed without using bedrails?: A Lot Help needed moving from lying on your back to sitting on the side of a flat bed without using bedrails?: A Lot Help needed moving to and from a bed to a chair (including a wheelchair)?: A Lot Help needed standing up from a chair using your arms (e.g., wheelchair or bedside chair)?: A Lot Help needed to walk in hospital room?: Total Help needed climbing 3-5 steps with a railing? : Total 6 Click Score: 10    End of Session Equipment Utilized During Treatment: Gait belt Activity Tolerance: Patient limited by lethargy Patient left: in bed;with bed alarm set;with family/visitor present Nurse Communication: Mobility status PT Visit Diagnosis: Unsteadiness on feet (R26.81);Muscle weakness (generalized) (M62.81);Difficulty in walking, not elsewhere classified (R26.2);Pain Pain - Right/Left: Left Pain - part of body: Knee    Time: 1610-9604 PT Time Calculation (min) (ACUTE ONLY): 39 min   Charges:   PT Evaluation $PT Eval Moderate Complexity: 1 Mod PT Treatments $Therapeutic Activity: 8-22 mins PT General Charges $$ ACUTE PT VISIT: 1 Visit         Elizabeth Palau, PT, DPT, GCS [phone]   Mercy Leppla 03/09/2024, 3:10 PM

## 2024-03-09 NOTE — Hospital Course (Signed)
 Gianina Olinde Tsuchiya is a 83 y.o. 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  Anemia with hemoglobin 7.3, received 1 unit PRBC, increased to 9.4. 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  Patient is seen by Dr. Joice Lofts on orthopedics, no need for surgery.

## 2024-03-09 NOTE — Consult Note (Signed)
 ORTHOPAEDIC CONSULTATION  REQUESTING PHYSICIAN: Marrion Coy, MD  Chief Complaint:   Left shoulder pain  History of Present Illness: [name] is an 83 y.o. female with multiple medical problems including hypertension, hyperlipidemia, hypothyroidism, gastroesophageal reflux disease, asthma, depression, chronic congestive heart failure, stroke glaucoma and anemia who is now less than 1 week status post a left total knee arthroplasty by Dr. Ernest Pine.  After discharge, the patient noted that she experienced nausea, vomiting, and intermittent diarrhea alternating with constipation, prompting her to return to the emergency room.  While being worked up in the emergency room, the patient also complained of left shoulder pain.  X-rays were obtained which suggested a possible nondisplaced acromial fracture.  Therefore, CT scan of the shoulder was ordered and orthopedic consultation was requested.  The patient denies any specific recent injury to the shoulder, but apparently had an injury to her shoulder several years ago.  More recently, she sustained a stroke which further affected the use of her shoulder and left upper extremity.  Past Medical History:  Diagnosis Date   Anemia    Arthritis    Asthma    uses inhaler just prior to surgery to avoid attack   Back pain    from previous injury   Cerebral microvascular disease    Chronic diastolic CHF (congestive heart failure) (HCC)    Complication of anesthesia    has woken  up during 2 different surgery   Depression    Gallstones    GERD (gastroesophageal reflux disease)    Hiatal hernia    History of kidney stones    HLD (hyperlipidemia)    HTN (hypertension)    Hypothyroidism    Iron deficiency anemia 12/2023   Kidney stones    Knee pain    Nausea and vomiting 10/15/2022   Non-diabetic pancreatic hormone dysfunction years   pt. states pancreas does not function properly    Ovarian cyst    Pancreatitis    Pneumonia, aspiration (HCC)    PONV (postoperative nausea and vomiting)    Seizures (HCC)    caused by dye injected during a procedure   Shortness of breath on exertion    Sinus problem    frequent infections/congestion   Stroke (HCC) 01/24/2020   a.) MRI brain 01/24/2020: small subacute infarct in the RIGHT frontal white matter   Stroke (HCC) 08/19/2022   a.) MRI brain 08/19/2022: acute infarcts in the RIGHT carona radiata and RIGHT anterior thalamus; chronic infarcts in the basal ganglia BILATERALLY   Thrombocytosis 12/2023   Vertebral artery stenosis, right    a.) MRI 12/03/2022: complete occlusion of the distal V2, V3, and V4 segments   Past Surgical History:  Procedure Laterality Date   APPENDECTOMY  1965   BILATERAL SALPINGOOPHORECTOMY  1965   CARPAL TUNNEL RELEASE Bilateral    CATARACT EXTRACTION W/ INTRAOCULAR LENS  IMPLANT, BILATERAL Bilateral    FOOT OSTEOTOMY  6 weeks ago   Left foot: great, 2nd & 3rd   FOOT OSTEOTOMY  5 years ago   Right great toe   HAND SURGERY Bilateral 2011-most recent   multiple hand surgeries, 2 on left, 3 on right   KNEE ARTHROPLASTY Right 04/28/2022   Procedure: COMPUTER ASSISTED TOTAL KNEE ARTHROPLASTY;  Surgeon: Donato Heinz, MD;  Location: [facility];  Service: Orthopedics;  Laterality: Right;   KNEE ARTHROPLASTY Left 03/02/2024   Procedure: ARTHROPLASTY, KNEE, TOTAL, USING IMAGELESS COMPUTER-ASSISTED NAVIGATION;  Surgeon: Donato Heinz, MD;  Location: [facility];  Service:  Orthopedics;  Laterality: Left;   LOOP RECORDER INSERTION N/A 05/16/2020   Procedure: LOOP RECORDER INSERTION;  Surgeon: Marcina Millard, MD;  Location: [facility];  Service: Cardiovascular;  Laterality: N/A;   NASAL SINUS SURGERY  most recent 7-8 yrs ago   7 sinus surgeries    PARTIAL HYSTERECTOMY  1963   TRIGGER FINGER RELEASE  11/19/2011   Procedure: RELEASE TRIGGER FINGER/A-1 PULLEY;  Surgeon: Nicki Reaper, MD;   Location: [facility];  Service: Orthopedics;  Laterality: Right;  release a-1 pulley right index finger and cyst removal   WRIST GANGLION EXCISION  1980's   right   Social History   Socioeconomic History   Marital status: Married    Spouse name: [name]   Number of children: 1   Years of education: Not on file   Highest education level: Not on file  Occupational History   Occupation: retired  Tobacco Use   Smoking status: Never   Smokeless tobacco: Never  Vaping Use   Vaping status: Never Used  Substance and Sexual Activity   Alcohol use: Not Currently    Alcohol/week: 0.0 - 1.0 standard drinks of alcohol    Comment: wine once every 2-3 months   Drug use: Never   Sexual activity: Not on file  Other Topics Concern   Not on file  Social History Narrative   Not on file   Social Drivers of Health   Financial Resource Strain: Low Risk  (07/08/2023)   Received from [facility]   Overall Financial Resource Strain (CARDIA)    Difficulty of Paying Living Expenses: Not hard at all  Food Insecurity: No Food Insecurity (03/08/2024)   Hunger Vital Sign    Worried About Running Out of Food in the Last Year: Never true    Ran Out of Food in the Last Year: Never true  Transportation Needs: No Transportation Needs (03/08/2024)   PRAPARE - Administrator, Civil Service (Medical): No    Lack of Transportation (Non-Medical): No  Physical Activity: Not on file  Stress: Not on file  Social Connections: Moderately Isolated (03/08/2024)   Social Connection and Isolation Panel [NHANES]    Frequency of Communication with Friends and Family: Three times a week    Frequency of Social Gatherings with Friends and Family: Once a week    Attends Religious Services: Never    Database administrator or Organizations: No    Attends Engineer, structural: Never    Marital Status: Married   Family History  Problem Relation Age of Onset   Anesthesia  problems Father        "bad lungs" couldn't wake him up   Heart disease Father    Heart attack Father 29   Breast cancer Maternal Grandmother    Breast cancer Maternal Aunt        x 2   Breast cancer Cousin    Pancreatic cancer Cousin    Heart attack Paternal Uncle    Stroke Paternal Grandfather    Allergies  Allergen Reactions   Cinobac [Cinoxacin] Anaphylaxis    "My throat swelled and I stopped breathing."   Latex Dermatitis    IgE = 14 (WNL) on 04/17/2022  Pt is highly reactive to any latex products. Blisters skin. "Takes my skin off"  "Skin will tears off"  IgE = 14 (WNL) on 04/17/2022  Pt is highly reactive to any latex products. Blisters skin. "Takes my skin off"  Other Shortness Of Breath    Muscle relaxers? Pt thinks allergic to muscle relaxers, reports stops breathing but not sure what medicine and if for sure muscle relaxer  Petroleum, Chemicals, Paints, Muscle Relaxers   Petrolatum Distillates [Petroleum Distillate] Shortness Of Breath    Passes out    Sulfa Antibiotics Itching   Meloxicam     Other Reaction(s): Not available, Unknown   Misc. Sulfonamide Containing Compounds Other (See Comments)   Nylon Itching and Rash   Prior to Admission medications   Medication Sig Start Date End Date Taking? Authorizing Provider  acetaminophen (TYLENOL) 325 MG tablet Take 2 tablets (650 mg total) by mouth every 4 (four) hours as needed for mild pain (or temp > 37.5 C (99.5 F)). 12/09/22   Angiulli, Mcarthur Rossetti, PA-C  albuterol (VENTOLIN HFA) 108 (90 Base) MCG/ACT inhaler Inhale 2 puffs into the lungs every 4 (four) hours as needed for wheezing or shortness of breath. 12/09/22   Angiulli, Mcarthur Rossetti, PA-C  amLODipine (NORVASC) 10 MG tablet Take 10 mg by mouth daily. 01/13/23   [provider]  aspirin EC 81 MG tablet Take 1 tablet (81 mg total) by mouth 2 (two) times daily. Swallow whole. 03/03/24   Rayburn Go, PA-C  atorvastatin (LIPITOR) 40 MG tablet Take 1 tablet (40 mg  total) by mouth daily. 12/09/22   Angiulli, Mcarthur Rossetti, PA-C  celecoxib (CELEBREX) 200 MG capsule Take 1 capsule (200 mg total) by mouth 2 (two) times daily. 03/03/24   Rayburn Go, PA-C  Cholecalciferol (VITAMIN D3) 125 MCG (5000 UT) CAPS Take 1 capsule by mouth daily.    [provider]  clopidogrel (PLAVIX) 75 MG tablet Take 1 tablet (75 mg total) by mouth daily. 12/09/22   Angiulli, Mcarthur Rossetti, PA-C  diclofenac Sodium (VOLTAREN) 1 % GEL Apply 2 g topically 4 (four) times daily. Patient taking differently: Apply 2 g topically daily as needed (pain). 12/09/22   Angiulli, Mcarthur Rossetti, PA-C  Ferrous Sulfate (IRON PO) Take 1 tablet by mouth daily. 9 mg gummy    [provider]  folic acid (FOLVITE) 800 MCG tablet Take 1 tablet (800 mcg total) by mouth daily. Patient taking differently: Take 3,200 mcg by mouth daily. 12/09/22   Angiulli, Mcarthur Rossetti, PA-C  gabapentin (NEURONTIN) 300 MG capsule Take 1 capsule (300 mg total) by mouth 2 (two) times daily. 12/09/22   Angiulli, Mcarthur Rossetti, PA-C  KLOR-CON M10 10 MEQ tablet Take 20 mEq by mouth at bedtime.    [provider]  levothyroxine (SYNTHROID) 50 MCG tablet Take 50 mcg by mouth daily before breakfast. 01/19/23   [provider]  Magnesium 250 MG TABS Take 250 mg by mouth daily.    [provider]  montelukast (SINGULAIR) 10 MG tablet Take 10 mg by mouth at bedtime. 12/09/22 02/23/24  [provider]  Multiple Vitamins-Minerals (HAIR SKIN & NAILS ADVANCED PO) Take 1 tablet by mouth daily.    [provider]  Multiple Vitamins-Minerals (MULTIVITAMIN GUMMIES ADULT) CHEW Chew 2 capsules by mouth daily.    [provider]  oxyCODONE (OXY IR/ROXICODONE) 5 MG immediate release tablet Take 1 tablet (5 mg total) by mouth every 4 (four) hours as needed for moderate pain (pain score 4-6) (pain score 4-6). 03/03/24   Rayburn Go, PA-C  pantoprazole (PROTONIX) 40 MG tablet TAKE 1 TABLET BY MOUTH EVERY DAY  08/11/23   Elijah Birk C, DO  propranolol ER (INDERAL LA) 60 MG 24 hr capsule Take 1  capsule (60 mg total) by mouth daily. 12/09/22   Angiulli, Mcarthur Rossetti, PA-C  traMADol (ULTRAM) 50 MG tablet Take 1-2 tablets (50-100 mg total) by mouth every 4 (four) hours as needed for moderate pain (pain score 4-6). 03/03/24   Rayburn Go, PA-C   CT ABDOMEN PELVIS WO CONTRAST Result Date: 03/08/2024 CLINICAL DATA:  Nausea and vomiting, constipation EXAM: CT ABDOMEN AND PELVIS WITHOUT CONTRAST TECHNIQUE: Multidetector CT imaging of the abdomen and pelvis was performed following the standard protocol without IV contrast. RADIATION DOSE REDUCTION: This exam was performed according to the departmental dose-optimization program which includes automated exposure control, adjustment of the mA and/or kV according to patient size and/or use of iterative reconstruction technique. COMPARISON:  11/23/2022 FINDINGS: Lower chest: No acute pleural or parenchymal lung disease. Hepatobiliary: Unremarkable unenhanced appearance of the liver and gallbladder. Pancreas: Unremarkable unenhanced appearance. Spleen: Unremarkable unenhanced appearance. Adrenals/Urinary Tract: No urinary tract calculi or obstructive uropathy within either kidney. The adrenals and bladder are unremarkable. Stomach/Bowel: No bowel obstruction or ileus. Minimal stool within the colon. Prior appendectomy. There is mild asymmetric wall thickening along the inferior margin of the distal transverse colon, measuring 4-5 mm, which could reflect inflammatory or infectious colitis. Stable hiatal hernia. Vascular/Lymphatic: Aortic atherosclerosis. No enlarged abdominal or pelvic lymph nodes. Reproductive: Prior hysterectomy. Stable 4.7 x 4.1 cm simple appearing left adnexal cyst. Other: No free fluid or free intraperitoneal gas. No abdominal wall hernia. Musculoskeletal: There are no acute or destructive bony abnormalities. Progressive bilateral hip osteoarthritis, right greater  than left. The inflammatory changes within the lower pelvic mesentery seen on prior exam have resolved in the interim. Reconstructed images demonstrate no additional findings. IMPRESSION: 1. Mild asymmetric wall thickening of the distal transverse colon, which could reflect underlying inflammatory or infectious colitis. Please correlate with clinical presentation. 2. No bowel obstruction or ileus. Minimal retained stool within the colon. 3. Stable 4.7 cm simple appearing left adnexal cyst, likely benign given long-term stability. 4.  Aortic Atherosclerosis (ICD10-I70.0). Electronically Signed   By: Sharlet Salina M.D.   On: 03/08/2024 22:17   CT Shoulder Left Wo Contrast Result Date: 03/08/2024 CLINICAL DATA:  Scapular fracture, subacute versus acute undisplaced fracture of the acromion EXAM: CT OF THE UPPER LEFT EXTREMITY WITHOUT CONTRAST TECHNIQUE: Multidetector CT imaging of the upper left extremity was performed according to the standard protocol. RADIATION DOSE REDUCTION: This exam was performed according to the departmental dose-optimization program which includes automated exposure control, adjustment of the mA and/or kV according to patient size and/or use of iterative reconstruction technique. COMPARISON:  Same day radiograph FINDINGS: Bones/Joint/Cartilage No acute fracture or dislocation. Irregularity about the acromion is compatible with degenerative change. Superior subluxation of the humeral head in relation to the glenoid compatible with thinning and probable partial tear of the supraspinatus. Ligaments Suboptimally assessed by CT. Muscles and Tendons Moderate atrophy of the supraspinatus muscle. Soft tissues Biapical pleuroparenchymal scarring in the lungs. Soft tissues are unremarkable. IMPRESSION: 1. No acute fracture or dislocation. 2. Superior subluxation of the humeral head in relation to the glenoid compatible with thinning and probable partial tear of the supraspinatus. Moderate atrophy of  the supraspinatus muscle. Electronically Signed   By: Minerva Fester M.D.   On: 03/08/2024 19:35   DG Shoulder Left Result Date: 03/08/2024 CLINICAL DATA:  Left shoulder pain. EXAM: LEFT SHOULDER - 2+ VIEW COMPARISON:  None Available. FINDINGS: There is probable subacute versus acute undisplaced fracture of the acromion of the left scapula. Correlate clinically. No other  acute fracture or dislocation. No aggressive osseous lesion. Glenohumeral and acromioclavicular joints are normal in alignment and exhibit mild degenerative changes. No soft tissue swelling. No radiopaque foreign bodies. IMPRESSION: Probable subacute versus acute undisplaced fracture of the acromion process of the left scapula. Correlate clinically. Electronically Signed   By: Jules Schick M.D.   On: 03/08/2024 18:08    Positive ROS: All other systems have been reviewed and were otherwise negative with the exception of those mentioned in the HPI and as above.  Physical Exam: General: Patient is sleeping, but in no acute distress Psychiatric:  Patient is competent for consent with normal mood and affect   Cardiovascular:  No pedal edema Respiratory:  No wheezing, non-labored breathing GI:  Abdomen is soft and non-tender Skin:  No lesions in the area of chief complaint Neurologic:  Sensation intact distally Lymphatic:  No axillary or cervical lymphadenopathy  Orthopedic Exam:  Orthopedic examination is limited to the left shoulder and upper extremity.  Skin inspection of the left shoulder is unremarkable.  No swelling, erythema, ecchymosis, abrasions, or other skin abnormalities are identified.  She has no tenderness to palpation over the anterior or lateral aspects of the shoulder.  She is able to actively flex and extend all digits of her hand and to flex and extend her wrist.  She has good capillary refill to all digits.  X-rays:  Recent plain radiographs and a CT scan of the left shoulder are available for review and have  been reviewed by myself.  Although the radiographs were read as possibly demonstrating a nondisplaced acromial fracture, and the CT scan showed no evidence for any fractures of the glenoid, the acromion, any other part of the scapula, or any proximal humerus fracture.  The humeral head is high riding, suggestive of a possible chronic underlying rotator cuff tear.  Both the films and report reviewed by myself and discussed with the patient and her family.  Assessment: Left shoulder pain secondary to impingement/tendinopathy with a probable at least partial-thickness rotator cuff tear.  Plan: The treatment options were discussed in detail with the patient's husband, who is at the bedside, as the patient is sleeping and he did not want her disturbed.  He states that the patient's overall status has improved significantly since being admitted, and attributes the transfusion and fluids to have been quite helpful in getting her back to baseline.  She was able to participate in physical therapy today and appears to be complaining of left shoulder pain.  At this point, he does not feel that any further intervention to the shoulder is necessary.    I have reviewed with him the x-ray and CT findings.  Based on these findings, I have explained to him that the patient may have a chronic rotator cuff tear, especially in light of her reported injury several years ago.  However, this is not an issue that requires urgent intervention, especially as she is less than 1 week out from her left total knee replacement.  As long as the left shoulder symptoms are manageable, we will continue to manage them nonsurgically.  She will follow-up with Dr. Ernest Pine as scheduled.  If her symptoms worsen, she may then be referred back to me on an outpatient basis.  Thank you for asking me to participate in the care of this most pleasant yet unfortunate woman.  I will sign off at this time.  If you have further need of orthopedic input  during this hospitalization, please reconsult me.  Maryagnes Amos, MD  Beeper #:  [phone]  03/09/2024 2:46 PM

## 2024-03-09 NOTE — Plan of Care (Signed)

## 2024-03-10 DIAGNOSIS — D649 Anemia, unspecified: Secondary | ICD-10-CM | POA: Diagnosis not present

## 2024-03-10 DIAGNOSIS — D509 Iron deficiency anemia, unspecified: Secondary | ICD-10-CM | POA: Diagnosis not present

## 2024-03-10 DIAGNOSIS — M25512 Pain in left shoulder: Secondary | ICD-10-CM | POA: Diagnosis not present

## 2024-03-10 DIAGNOSIS — N1831 Chronic kidney disease, stage 3a: Secondary | ICD-10-CM | POA: Diagnosis not present

## 2024-03-10 LAB — BASIC METABOLIC PANEL WITH GFR
Anion gap: 10 (ref 5–15)
BUN: 12 mg/dL (ref 8–23)
CO2: 24 mmol/L (ref 22–32)
Calcium: 8.5 mg/dL — ABNORMAL LOW (ref 8.9–10.3)
Chloride: 102 mmol/L (ref 98–111)
Creatinine, Ser: 1.06 mg/dL — ABNORMAL HIGH (ref 0.44–1.00)
GFR, Estimated: 53 mL/min — ABNORMAL LOW (ref >60)
Glucose, Bld: 77 mg/dL (ref 70–99)
Potassium: 3.4 mmol/L — ABNORMAL LOW (ref 3.5–5.1)
Sodium: 136 mmol/L (ref 135–145)

## 2024-03-10 LAB — CBC
HCT: 29.5 % — ABNORMAL LOW (ref 36.0–46.0)
Hemoglobin: 9.5 g/dL — ABNORMAL LOW (ref 12.0–15.0)
MCH: 27.3 pg (ref 26.0–34.0)
MCHC: 32.2 g/dL (ref 30.0–36.0)
MCV: 84.8 fL (ref 80.0–100.0)
Platelets: 595 10*3/uL — ABNORMAL HIGH (ref 150–400)
RBC: 3.48 MIL/uL — ABNORMAL LOW (ref 3.87–5.11)
RDW: 20.2 % — ABNORMAL HIGH (ref 11.5–15.5)
WBC: 14.3 10*3/uL — ABNORMAL HIGH (ref 4.0–10.5)
nRBC: 0 % (ref 0.0–0.2)

## 2024-03-10 LAB — HOMOCYSTEINE: Homocysteine: 9.4 umol/L (ref 0.0–21.3)

## 2024-03-10 LAB — MAGNESIUM: Magnesium: 1.8 mg/dL (ref 1.7–2.4)

## 2024-03-10 MED ORDER — ENSURE ENLIVE PO LIQD
237.0000 mL | Freq: Two times a day (BID) | ORAL | Status: DC
  Administered 2024-03-10: 237 mL via ORAL

## 2024-03-10 MED ORDER — POTASSIUM CHLORIDE CRYS ER 20 MEQ PO TBCR
40.0000 meq | EXTENDED_RELEASE_TABLET | Freq: Once | ORAL | Status: AC
  Administered 2024-03-10: 40 meq via ORAL
  Filled 2024-03-10: qty 2

## 2024-03-10 MED ORDER — AMOXICILLIN-POT CLAVULANATE 500-125 MG PO TABS: 1.0000 | ORAL_TABLET | Freq: Two times a day (BID) | ORAL | 0 refills | Status: AC

## 2024-03-10 NOTE — Plan of Care (Signed)
  Problem: Clinical Measurements: Goal: Ability to maintain clinical measurements within normal limits will improve Outcome: Progressing   Problem: Activity: Goal: Risk for activity intolerance will decrease Outcome: Progressing   Problem: Pain Managment: Goal: General experience of comfort will improve and/or be controlled Outcome: Progressing

## 2024-03-10 NOTE — Progress Notes (Signed)
 Physical Therapy Treatment Patient Details Name: [name] MRN: [id number] DOB: [birth year] Today's Date: 03/10/2024   History of Present Illness Pt admitted for symptomatic anemia with complaints of N/V and abdominal pain. 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    PT Comments  Pt is making gradual progress towards goals with ability to demonstrate improved cognition, however still remains confused. Husband at bedside reports good intake of nutrition. Pt able to transfer on/off BSC although requires +2 for safety. Husband states he feels confident with amount of assistance, able to demonstrate appropriate assistance during session. Also encouraged to use gait belt with patient as she is high risk for falls. Will continue to progress as able.   If plan is discharge home, recommend the following: A lot of help with walking and/or transfers;A lot of help with bathing/dressing/bathroom;Supervision due to cognitive status;Help with stairs or ramp for entrance;Assistance with cooking/housework;Assist for transportation   Can travel by private vehicle        Equipment Recommendations  None recommended by PT    Recommendations for Other Services       Precautions / Restrictions Precautions Precautions: Fall Recall of Precautions/Restrictions: Impaired Restrictions Weight Bearing Restrictions Per Provider Order: Yes LLE Weight Bearing Per Provider Order: Weight bearing as tolerated Other Position/Activity Restrictions: ortho consult complete- no restrictions     Mobility  Bed Mobility Overal bed mobility: Needs Assistance Bed Mobility: Supine to Sit, Sit to Supine     Supine to sit: Mod assist, HOB elevated, Used rails Sit to supine: Mod assist   General bed mobility comments: unable to initiate mobility. Once seated, improved seated balance with ability for supervision for static balance. When  returned to bed, +2 for repositioning    Transfers Overall transfer level: Needs assistance Equipment used: Rolling walker (2 wheels) Transfers: Bed to chair/wheelchair/BSC Sit to Stand: Mod assist, +2 physical assistance   Step pivot transfers: Mod assist, +2 physical assistance       General transfer comment: follows commands, inconsistently. Decreased safety awareness. Has difficulty with hand placement on RW for L UE- tends to fall off. Once standing, guarding away from L side. +2 for balance during transition to [facility]    Ambulation/Gait Ambulation/Gait assistance: Mod assist Gait Distance (Feet): 3 Feet Assistive device: Rolling walker (2 wheels) Gait Pattern/deviations: Step-to pattern, Decreased step length - left, Decreased stance time - left, Decreased weight shift to right, Antalgic, Decreased dorsiflexion - left       General Gait Details: small distance ambulation performed this date to [facility] and then back to bed with side steps up towards HOB. Pt declines further ambulation despite encouragement.   Stairs             Wheelchair Mobility     Tilt Bed    Modified Rankin (Stroke Patients Only)       Balance Overall balance assessment: Needs assistance Sitting-balance support: Feet supported Sitting balance-Leahy Scale: Fair     Standing balance support: Bilateral upper extremity supported Standing balance-Leahy Scale: Zero                              Communication Communication Communication: No apparent difficulties  Cognition Arousal: Alert Behavior During Therapy: WFL for tasks assessed/performed   PT - Cognitive impairments: Orientation  PT - Cognition Comments: more alert this date and less impulsive, however has difficulty with following commands and still remains confused to situation/day of week Following commands: Impaired Following commands impaired: Follows one step commands  inconsistently    Cueing Cueing Techniques: Verbal cues, Tactile cues, Visual cues  Exercises Other Exercises Other Exercises: Pt with transfer from bed<>BSC with mod A+2. Needs assist for donning/doffing underwear and hygiene. Able to have continient void on BSC. Other Exercises: Seated ther-ex performed on L LE including LAQ and knee flexion stretches. 10 reps. Heavy cues for sequencing, correct technique and participation Other Exercises: education given about car transfers vs EMS home.    General Comments        Pertinent Vitals/Pain Pain Assessment Pain Assessment: Faces Faces Pain Scale: Hurts little more Pain Location: L knee and L shoulder. Pain Descriptors / Indicators: Discomfort Pain Intervention(s): Limited activity within patient's tolerance, Repositioned    Home Living                          Prior Function            PT Goals (current goals can now be found in the care plan section) Acute Rehab PT Goals Patient Stated Goal: to go home with home health PT PT Goal Formulation: With patient/family Time For Goal Achievement: 03/23/24 Potential to Achieve Goals: Fair Progress towards PT goals: Progressing toward goals    Frequency    Min 3X/week      PT Plan      Co-evaluation              AM-PAC PT "6 Clicks" Mobility   Outcome Measure  Help needed turning from your back to your side while in a flat bed without using bedrails?: A Lot Help needed moving from lying on your back to sitting on the side of a flat bed without using bedrails?: A Lot Help needed moving to and from a bed to a chair (including a wheelchair)?: A Lot Help needed standing up from a chair using your arms (e.g., wheelchair or bedside chair)?: A Lot Help needed to walk in hospital room?: Total Help needed climbing 3-5 steps with a railing? : Total 6 Click Score: 10    End of Session Equipment Utilized During Treatment: Gait belt Activity Tolerance: Patient  limited by fatigue Patient left: in bed;with bed alarm set;with family/visitor present Nurse Communication: Mobility status PT Visit Diagnosis: Unsteadiness on feet (R26.81);Muscle weakness (generalized) (M62.81);Difficulty in walking, not elsewhere classified (R26.2);Pain Pain - Right/Left: Left Pain - part of body: Knee     Time: 1610-9604 PT Time Calculation (min) (ACUTE ONLY): 17 min  Charges:    $Therapeutic Activity: 8-22 mins PT General Charges $$ ACUTE PT VISIT: 1 Visit                     Elizabeth Palau, PT, DPT, GCS [phone]    Muhanad Torosyan 03/10/2024, 10:04 AM

## 2024-03-10 NOTE — Discharge Summary (Signed)
 Physician Discharge Summary   Patient: [name] MRN: [id number] DOB: [birth year]  Admit date:     03/08/2024  Discharge date: 03/10/24  Discharge Physician: Marrion Coy   PCP: Marguarite Arbour, MD   Recommendations at discharge:   With PCP in 1 week. Follow-up with orthopedics in 2 weeks.  Discharge Diagnoses: Principal Problem:   Symptomatic anemia Active Problems:   Iron deficiency anemia   History of total knee arthroplasty, left   Left shoulder pain   Nausea & vomiting   Colitis   Chronic diastolic CHF (congestive heart failure) (HCC)   Asthma without status asthmaticus   Essential hypertension   Acquired hypothyroidism   History of CVA (cerebrovascular accident)   HLD (hyperlipidemia)   Chronic kidney disease, stage 3a (HCC)  Resolved Problems:   * No resolved hospital problems. *  Hospital Course:  [name] is a 83 y.o. 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  Anemia with hemoglobin 7.3, received 1 unit PRBC, increased to 9.4. 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  Patient is seen by Dr. Joice Lofts on orthopedics, no need for surgery.   Assessment and Plan:  Symptomatic anemia and iron deficiency anemia Reactive thrombocytosis.: Hemoglobin dropped from 11.5 on 02/23/24 to 7.3.    She received 1 unit PRBC, hemoglobin has improved to 9.4.  Repeat hemoglobin 9.5.   Left shoulder pain with injury: CT showed superior subluxation of the humeral head in relation to the glenoid  compatible with thinning and probable partial tear of the supraspinatus. Moderate atrophy of the supraspinatus muscle. Patient will be seen by Dr. Joice Lofts, not to consider surgery.  Patient will be follow-up with him in 2 weeks.   Hypoglycemia secondary to n.p.o. status. Glucose improved after starting diet.   History of total knee arthroplasty, left: Continue pain medicine prescribed had a recent on admission.   Nausea & vomiting due to possible colitis: CT showed mild asymmetric wall thickening of the distal transverse colon, indicating possible colitis.  Patient has a WBC 17.9, no fever, clinically not septic. Diarrhea is better today, changed to oral Augmentin.   Chronic diastolic CHF (congestive heart failure) (HCC): 2D echo on 11/24/2022 showed EF of 55-60%.  No leg edema or JVD.  CHF is compensated. No exacerbation.   Asthma without status asthmaticus: Stable -Singulair, bronchodilators and as needed Mucinex   Essential hypertension: Pressure not significantly elevated, continue hold amlodipine.  May restart at her next office visit if blood pressure go up again.   Acquired hypothyroidism -Synthroid   History of CVA (cerebrovascular accident) -Continue aspirin, Lipitor Resume Plavix.   HLD (hyperlipidemia) -Lipitor   Chronic kidney disease, stage 3a (HCC) Hypokalemia. Renal function has been stable, hypokalemia, given 40 mEq of KCl before discharge.  Resume 20 mEq of KCl daily.      Consultants: Orthopedics Procedures performed: None  Disposition: Home health Diet recommendation:  Discharge Diet Orders (From admission, onward)     Start     Ordered   03/10/24 0000  Diet - low sodium heart healthy  03/10/24 0935           Cardiac diet DISCHARGE MEDICATION: Allergies as of 03/10/2024       Reactions   Cinobac [cinoxacin] Anaphylaxis   "My throat swelled and I stopped breathing."   Latex Dermatitis   IgE = 14 (WNL) on 04/17/2022 Pt is highly reactive  to any latex products. Blisters skin. "Takes my skin off" "Skin will tears off"  IgE = 14 (WNL) on 04/17/2022  Pt is highly reactive to any latex products. Blisters skin. "Takes my skin off"   Other Shortness Of Breath   Muscle relaxers? Pt thinks allergic to muscle relaxers, reports stops breathing but not sure what medicine and if for sure muscle relaxer Petroleum, Chemicals, Paints, Muscle Relaxers   Petrolatum Distillates [petroleum Distillate] Shortness Of Breath   Passes out    Sulfa Antibiotics Itching   Meloxicam    Other Reaction(s): Not available, Unknown   Misc. Sulfonamide Containing Compounds Other (See Comments)   Nylon Itching, Rash        Medication List     STOP taking these medications    amLODipine 10 MG tablet Commonly known as: NORVASC       TAKE these medications    acetaminophen 325 MG tablet Commonly known as: TYLENOL Take 2 tablets (650 mg total) by mouth every 4 (four) hours as needed for mild pain (or temp > 37.5 C (99.5 F)).   albuterol 108 (90 Base) MCG/ACT inhaler Commonly known as: VENTOLIN HFA Inhale 2 puffs into the lungs every 4 (four) hours as needed for wheezing or shortness of breath.   amoxicillin-clavulanate 500-125 MG tablet Commonly known as: AUGMENTIN Take 1 tablet by mouth every 12 (twelve) hours for 5 days.   aspirin EC 81 MG tablet Take 1 tablet (81 mg total) by mouth 2 (two) times daily. Swallow whole.   atorvastatin 40 MG tablet Commonly known as: LIPITOR Take 1 tablet (40 mg total) by mouth daily.   celecoxib 200 MG capsule Commonly known as: CELEBREX Take 1 capsule (200 mg total) by mouth 2 (two) times daily.   clopidogrel 75 MG tablet Commonly known as: PLAVIX Take 1 tablet (75 mg total) by mouth daily.   diclofenac Sodium 1 % Gel Commonly known as: VOLTAREN Apply 2 g topically 4 (four) times daily. What changed:  when to take this reasons to take this   folic acid 800 MCG tablet Commonly known as:  FOLVITE Take 1 tablet (800 mcg total) by mouth daily. What changed: how much to take   gabapentin 300 MG capsule Commonly known as: NEURONTIN Take 1 capsule (300 mg total) by mouth 2 (two) times daily.   IRON PO Take 1 tablet by mouth daily. 9 mg gummy   Klor-Con M10 10 MEQ tablet Generic drug: potassium chloride Take 20 mEq by mouth at bedtime.   levothyroxine 50 MCG tablet Commonly known as: SYNTHROID Take 50 mcg by mouth daily before breakfast.   Magnesium 250 MG Tabs Take 250 mg by mouth daily.   montelukast 10 MG tablet Commonly known as: SINGULAIR Take 10 mg by mouth at bedtime.   Multivitamin Gummies Adult Chew Chew 2 capsules by mouth daily.   HAIR SKIN & NAILS ADVANCED PO Take 1 tablet by mouth daily.   oxyCODONE 5 MG immediate release tablet Commonly known as: Oxy IR/ROXICODONE Take 1 tablet (5 mg total) by mouth every 4 (four) hours as needed for moderate pain (pain score 4-6) (pain score 4-6).  pantoprazole 40 MG tablet Commonly known as: PROTONIX TAKE 1 TABLET BY MOUTH EVERY DAY   propranolol ER 60 MG 24 hr capsule Commonly known as: INDERAL LA Take 1 capsule (60 mg total) by mouth daily.   traMADol 50 MG tablet Commonly known as: ULTRAM Take 1-2 tablets (50-100 mg total) by mouth every 4 (four) hours as needed for moderate pain (pain score 4-6).   Vitamin D3 125 MCG (5000 UT) Caps Take 1 capsule by mouth daily.        Follow-up Information     Marguarite Arbour, MD Follow up in 1 week(s).   Specialty: Internal Medicine Contact information: [address] [place] [address] [phone]         Christena Flake, MD Follow up in 2 week(s).   Specialty: Orthopedic Surgery Contact information: [address] [facility] [place] [address] [phone]                Discharge Exam: Ceasar Mons Weights   03/08/24 2027  Weight: 49.4 kg   General exam: Appears calm and comfortable   Respiratory system: Clear to auscultation. Respiratory effort normal. Cardiovascular system: S1 & S2 heard, RRR. No JVD, murmurs, rubs, gallops or clicks. No pedal edema. Gastrointestinal system: Abdomen is nondistended, soft and nontender. No organomegaly or masses felt. Normal bowel sounds heard. Central nervous system: Alert and oriented. No focal neurological deficits. Extremities: Symmetric 5 x 5 power. Skin: No rashes, lesions or ulcers Psychiatry: Judgement and insight appear normal. Mood & affect appropriate.    Condition at discharge: good  The results of significant diagnostics from this hospitalization (including imaging, microbiology, ancillary and laboratory) are listed below for reference.   Imaging Studies: CT ABDOMEN PELVIS WO CONTRAST Result Date: 03/08/2024 CLINICAL DATA:  Nausea and vomiting, constipation EXAM: CT ABDOMEN AND PELVIS WITHOUT CONTRAST TECHNIQUE: Multidetector CT imaging of the abdomen and pelvis was performed following the standard protocol without IV contrast. RADIATION DOSE REDUCTION: This exam was performed according to the departmental dose-optimization program which includes automated exposure control, adjustment of the mA and/or kV according to patient size and/or use of iterative reconstruction technique. COMPARISON:  11/23/2022 FINDINGS: Lower chest: No acute pleural or parenchymal lung disease. Hepatobiliary: Unremarkable unenhanced appearance of the liver and gallbladder. Pancreas: Unremarkable unenhanced appearance. Spleen: Unremarkable unenhanced appearance. Adrenals/Urinary Tract: No urinary tract calculi or obstructive uropathy within either kidney. The adrenals and bladder are unremarkable. Stomach/Bowel: No bowel obstruction or ileus. Minimal stool within the colon. Prior appendectomy. There is mild asymmetric wall thickening along the inferior margin of the distal transverse colon, measuring 4-5 mm, which could reflect inflammatory or infectious  colitis. Stable hiatal hernia. Vascular/Lymphatic: Aortic atherosclerosis. No enlarged abdominal or pelvic lymph nodes. Reproductive: Prior hysterectomy. Stable 4.7 x 4.1 cm simple appearing left adnexal cyst. Other: No free fluid or free intraperitoneal gas. No abdominal wall hernia. Musculoskeletal: There are no acute or destructive bony abnormalities. Progressive bilateral hip osteoarthritis, right greater than left. The inflammatory changes within the lower pelvic mesentery seen on prior exam have resolved in the interim. Reconstructed images demonstrate no additional findings. IMPRESSION: 1. Mild asymmetric wall thickening of the distal transverse colon, which could reflect underlying inflammatory or infectious colitis. Please correlate with clinical presentation. 2. No bowel obstruction or ileus. Minimal retained stool within the colon. 3. Stable 4.7 cm simple appearing left adnexal cyst, likely benign given long-term stability. 4.  Aortic Atherosclerosis (ICD10-I70.0). Electronically Signed   By: Casimiro Needle  Manson Passey M.D.   On: 03/08/2024 22:17   CT Shoulder Left Wo Contrast Result Date: 03/08/2024 CLINICAL DATA:  Scapular fracture, subacute versus acute undisplaced fracture of the acromion EXAM: CT OF THE UPPER LEFT EXTREMITY WITHOUT CONTRAST TECHNIQUE: Multidetector CT imaging of the upper left extremity was performed according to the standard protocol. RADIATION DOSE REDUCTION: This exam was performed according to the departmental dose-optimization program which includes automated exposure control, adjustment of the mA and/or kV according to patient size and/or use of iterative reconstruction technique. COMPARISON:  Same day radiograph FINDINGS: Bones/Joint/Cartilage No acute fracture or dislocation. Irregularity about the acromion is compatible with degenerative change. Superior subluxation of the humeral head in relation to the glenoid compatible with thinning and probable partial tear of the supraspinatus.  Ligaments Suboptimally assessed by CT. Muscles and Tendons Moderate atrophy of the supraspinatus muscle. Soft tissues Biapical pleuroparenchymal scarring in the lungs. Soft tissues are unremarkable. IMPRESSION: 1. No acute fracture or dislocation. 2. Superior subluxation of the humeral head in relation to the glenoid compatible with thinning and probable partial tear of the supraspinatus. Moderate atrophy of the supraspinatus muscle. Electronically Signed   By: Minerva Fester M.D.   On: 03/08/2024 19:35   DG Shoulder Left Result Date: 03/08/2024 CLINICAL DATA:  Left shoulder pain. EXAM: LEFT SHOULDER - 2+ VIEW COMPARISON:  None Available. FINDINGS: There is probable subacute versus acute undisplaced fracture of the acromion of the left scapula. Correlate clinically. No other acute fracture or dislocation. No aggressive osseous lesion. Glenohumeral and acromioclavicular joints are normal in alignment and exhibit mild degenerative changes. No soft tissue swelling. No radiopaque foreign bodies. IMPRESSION: Probable subacute versus acute undisplaced fracture of the acromion process of the left scapula. Correlate clinically. Electronically Signed   By: Jules Schick M.D.   On: 03/08/2024 18:08   DG Knee Left Port Result Date: 03/02/2024 CLINICAL DATA:  Total knee replacement. EXAM: PORTABLE LEFT KNEE - 1-2 VIEW COMPARISON:  None Available. FINDINGS: Left knee arthroplasty in expected alignment. No periprosthetic lucency or fracture. There has been patellar resurfacing. Recent postsurgical change includes air and edema in the soft tissues and joint space. Anterior skin staples and presumed drain in place. IMPRESSION: Left knee arthroplasty without immediate postoperative complication. Electronically Signed   By: Narda Rutherford M.D.   On: 03/02/2024 16:39    Microbiology: Results for orders placed or performed during the hospital encounter of 03/08/24  Culture, blood (Routine X 2) w Reflex to ID Panel      Status: None (Preliminary result)   Collection Time: 03/09/24  3:27 AM   Specimen: BLOOD RIGHT ARM  Result Value Ref Range Status   Specimen Description BLOOD RIGHT ARM  Final   Special Requests   Final    BOTTLES DRAWN AEROBIC AND ANAEROBIC Blood Culture results may not be optimal due to an inadequate volume of blood received in culture bottles   Culture   Final    NO GROWTH < 12 HOURS Performed at [facility], [address], [place], [place] [address]    Report Status PENDING  Incomplete  Culture, blood (Routine X 2) w Reflex to ID Panel     Status: None (Preliminary result)   Collection Time: 03/09/24  3:27 AM   Specimen: BLOOD RIGHT HAND  Result Value Ref Range Status   Specimen Description BLOOD RIGHT HAND  Final   Special Requests   Final    BOTTLES DRAWN AEROBIC ONLY Blood Culture results may not be optimal due  to an inadequate volume of blood received in culture bottles   Culture   Final    NO GROWTH < 12 HOURS Performed at [facility], [address] [place], [place] [address]    Report Status PENDING  Incomplete    Labs: CBC: Recent Labs  Lab 03/08/24 1149 03/09/24 0327 03/10/24 0416  WBC 17.9* 15.3* 14.3*  HGB 7.3* 9.4* 9.5*  HCT 23.0* 29.3* 29.5*  MCV 81.9 83.2 84.8  PLT 573* 520* 595*   Basic Metabolic Panel: Recent Labs  Lab 03/08/24 1149 03/09/24 0327 03/10/24 0416  NA 135 135 136  K 4.3 3.8 3.4*  CL 102 103 102  CO2 22 22 24   GLUCOSE 68* 50* 77  BUN 23 17 12   CREATININE 1.07* 1.03* 1.06*  CALCIUM 8.8* 8.6* 8.5*  MG  --   --  1.8   Liver Function Tests: No results for input(s): "AST", "ALT", "ALKPHOS", "BILITOT", "PROT", "ALBUMIN" in the last 168 hours. CBG: No results for input(s): "GLUCAP" in the last 168 hours.  Discharge time spent: greater than 30 minutes.  Signed: Marrion Coy, MD [facility] 03/10/2024

## 2024-03-11 DIAGNOSIS — R569 Unspecified convulsions: Secondary | ICD-10-CM | POA: Diagnosis not present

## 2024-03-11 DIAGNOSIS — K861 Other chronic pancreatitis: Secondary | ICD-10-CM | POA: Diagnosis not present

## 2024-03-11 DIAGNOSIS — D509 Iron deficiency anemia, unspecified: Secondary | ICD-10-CM | POA: Diagnosis not present

## 2024-03-11 DIAGNOSIS — I110 Hypertensive heart disease with heart failure: Secondary | ICD-10-CM | POA: Diagnosis not present

## 2024-03-11 DIAGNOSIS — F32A Depression, unspecified: Secondary | ICD-10-CM | POA: Diagnosis not present

## 2024-03-11 DIAGNOSIS — J45909 Unspecified asthma, uncomplicated: Secondary | ICD-10-CM | POA: Diagnosis not present

## 2024-03-11 DIAGNOSIS — I5032 Chronic diastolic (congestive) heart failure: Secondary | ICD-10-CM | POA: Diagnosis not present

## 2024-03-11 DIAGNOSIS — F419 Anxiety disorder, unspecified: Secondary | ICD-10-CM | POA: Diagnosis not present

## 2024-03-11 DIAGNOSIS — Z471 Aftercare following joint replacement surgery: Secondary | ICD-10-CM | POA: Diagnosis not present

## 2024-03-11 NOTE — TOC Transition Note (Addendum)
 Transition of Care [facility]) - Discharge Note   Patient Details  Name: [name] MRN: [id number] Date of Birth: [birth year]  Transition of Care [facility] CM/SW Contact:  Cherre Blanc, RN Phone Number: 03/11/2024, 6:43 AM   Clinical Narrative:     Patient is medically clear for dc to home. TOC spoke with Brandi at [facility] to resume HH PT. No other TOC needs identified.        Patient Goals and CMS Choice            Discharge Placement                       Discharge Plan and Services Additional resources added to the After Visit Summary for                                       Social Drivers of Health (SDOH) Interventions SDOH Screenings   Food Insecurity: No Food Insecurity (03/08/2024)  Housing: Low Risk  (03/08/2024)  Transportation Needs: No Transportation Needs (03/08/2024)  Utilities: Not At Risk (03/08/2024)  Depression (PHQ2-9): Low Risk  (01/14/2024)  Financial Resource Strain: Low Risk  (07/08/2023)   Received from [facility]  Social Connections: Moderately Isolated (03/08/2024)  Tobacco Use: Low Risk  (03/08/2024)     Readmission Risk Interventions     No data to display

## 2024-03-12 DIAGNOSIS — R569 Unspecified convulsions: Secondary | ICD-10-CM | POA: Diagnosis not present

## 2024-03-12 DIAGNOSIS — I110 Hypertensive heart disease with heart failure: Secondary | ICD-10-CM | POA: Diagnosis not present

## 2024-03-12 DIAGNOSIS — Z471 Aftercare following joint replacement surgery: Secondary | ICD-10-CM | POA: Diagnosis not present

## 2024-03-12 DIAGNOSIS — J45909 Unspecified asthma, uncomplicated: Secondary | ICD-10-CM | POA: Diagnosis not present

## 2024-03-12 DIAGNOSIS — D509 Iron deficiency anemia, unspecified: Secondary | ICD-10-CM | POA: Diagnosis not present

## 2024-03-12 DIAGNOSIS — I5032 Chronic diastolic (congestive) heart failure: Secondary | ICD-10-CM | POA: Diagnosis not present

## 2024-03-12 DIAGNOSIS — K861 Other chronic pancreatitis: Secondary | ICD-10-CM | POA: Diagnosis not present

## 2024-03-12 DIAGNOSIS — F419 Anxiety disorder, unspecified: Secondary | ICD-10-CM | POA: Diagnosis not present

## 2024-03-12 DIAGNOSIS — F32A Depression, unspecified: Secondary | ICD-10-CM | POA: Diagnosis not present

## 2024-03-14 DIAGNOSIS — F419 Anxiety disorder, unspecified: Secondary | ICD-10-CM | POA: Diagnosis not present

## 2024-03-14 DIAGNOSIS — F32A Depression, unspecified: Secondary | ICD-10-CM | POA: Diagnosis not present

## 2024-03-14 DIAGNOSIS — I110 Hypertensive heart disease with heart failure: Secondary | ICD-10-CM | POA: Diagnosis not present

## 2024-03-14 DIAGNOSIS — I5032 Chronic diastolic (congestive) heart failure: Secondary | ICD-10-CM | POA: Diagnosis not present

## 2024-03-14 DIAGNOSIS — D509 Iron deficiency anemia, unspecified: Secondary | ICD-10-CM | POA: Diagnosis not present

## 2024-03-14 DIAGNOSIS — K861 Other chronic pancreatitis: Secondary | ICD-10-CM | POA: Diagnosis not present

## 2024-03-14 DIAGNOSIS — J45909 Unspecified asthma, uncomplicated: Secondary | ICD-10-CM | POA: Diagnosis not present

## 2024-03-14 DIAGNOSIS — R569 Unspecified convulsions: Secondary | ICD-10-CM | POA: Diagnosis not present

## 2024-03-14 DIAGNOSIS — Z471 Aftercare following joint replacement surgery: Secondary | ICD-10-CM | POA: Diagnosis not present

## 2024-03-14 LAB — CULTURE, BLOOD (ROUTINE X 2)
Culture: NO GROWTH
Culture: NO GROWTH

## 2024-03-15 ENCOUNTER — Ambulatory Visit: Payer: Medicare HMO | Admitting: Physical Therapy

## 2024-03-15 ENCOUNTER — Encounter: Payer: Medicare HMO | Admitting: Occupational Therapy

## 2024-03-15 DIAGNOSIS — M25462 Effusion, left knee: Secondary | ICD-10-CM | POA: Diagnosis not present

## 2024-03-15 DIAGNOSIS — M25562 Pain in left knee: Secondary | ICD-10-CM | POA: Diagnosis not present

## 2024-03-17 DIAGNOSIS — M25462 Effusion, left knee: Secondary | ICD-10-CM | POA: Diagnosis not present

## 2024-03-17 DIAGNOSIS — M25562 Pain in left knee: Secondary | ICD-10-CM | POA: Diagnosis not present

## 2024-03-22 ENCOUNTER — Ambulatory Visit: Payer: Medicare HMO | Admitting: Physical Therapy

## 2024-03-22 ENCOUNTER — Encounter: Payer: Medicare HMO | Admitting: Occupational Therapy

## 2024-03-22 DIAGNOSIS — Z79899 Other long term (current) drug therapy: Secondary | ICD-10-CM | POA: Diagnosis not present

## 2024-03-22 DIAGNOSIS — D500 Iron deficiency anemia secondary to blood loss (chronic): Secondary | ICD-10-CM | POA: Diagnosis not present

## 2024-03-22 DIAGNOSIS — Z96652 Presence of left artificial knee joint: Secondary | ICD-10-CM | POA: Diagnosis not present

## 2024-03-24 DIAGNOSIS — Z96652 Presence of left artificial knee joint: Secondary | ICD-10-CM | POA: Diagnosis not present

## 2024-03-24 DIAGNOSIS — M25662 Stiffness of left knee, not elsewhere classified: Secondary | ICD-10-CM | POA: Diagnosis not present

## 2024-03-24 DIAGNOSIS — Z471 Aftercare following joint replacement surgery: Secondary | ICD-10-CM | POA: Diagnosis not present

## 2024-03-29 ENCOUNTER — Ambulatory Visit: Payer: Medicare HMO | Admitting: Physical Therapy

## 2024-03-29 ENCOUNTER — Encounter: Payer: Medicare HMO | Admitting: Occupational Therapy

## 2024-03-29 DIAGNOSIS — M25662 Stiffness of left knee, not elsewhere classified: Secondary | ICD-10-CM | POA: Diagnosis not present

## 2024-03-29 DIAGNOSIS — Z96652 Presence of left artificial knee joint: Secondary | ICD-10-CM | POA: Diagnosis not present

## 2024-03-31 DIAGNOSIS — Z96652 Presence of left artificial knee joint: Secondary | ICD-10-CM | POA: Diagnosis not present

## 2024-04-05 ENCOUNTER — Ambulatory Visit: Payer: Medicare HMO | Admitting: Physical Therapy

## 2024-04-05 ENCOUNTER — Encounter: Payer: Medicare HMO | Admitting: Occupational Therapy

## 2024-04-05 DIAGNOSIS — Z96652 Presence of left artificial knee joint: Secondary | ICD-10-CM | POA: Diagnosis not present

## 2024-04-06 DIAGNOSIS — D500 Iron deficiency anemia secondary to blood loss (chronic): Secondary | ICD-10-CM | POA: Diagnosis not present

## 2024-04-07 DIAGNOSIS — Z96652 Presence of left artificial knee joint: Secondary | ICD-10-CM | POA: Diagnosis not present

## 2024-04-12 ENCOUNTER — Ambulatory Visit: Payer: Medicare HMO | Admitting: Physical Therapy

## 2024-04-12 ENCOUNTER — Encounter: Payer: Medicare HMO | Admitting: Occupational Therapy

## 2024-04-12 DIAGNOSIS — Z96652 Presence of left artificial knee joint: Secondary | ICD-10-CM | POA: Diagnosis not present

## 2024-04-12 DIAGNOSIS — M25662 Stiffness of left knee, not elsewhere classified: Secondary | ICD-10-CM | POA: Diagnosis not present

## 2024-04-14 DIAGNOSIS — Z96652 Presence of left artificial knee joint: Secondary | ICD-10-CM | POA: Diagnosis not present

## 2024-04-14 DIAGNOSIS — M25662 Stiffness of left knee, not elsewhere classified: Secondary | ICD-10-CM | POA: Diagnosis not present

## 2024-04-19 ENCOUNTER — Ambulatory Visit: Payer: Medicare HMO | Admitting: Physical Therapy

## 2024-04-19 ENCOUNTER — Encounter: Payer: Medicare HMO | Admitting: Occupational Therapy

## 2024-04-19 DIAGNOSIS — Z96652 Presence of left artificial knee joint: Secondary | ICD-10-CM | POA: Diagnosis not present

## 2024-04-19 DIAGNOSIS — M25662 Stiffness of left knee, not elsewhere classified: Secondary | ICD-10-CM | POA: Diagnosis not present

## 2024-04-21 DIAGNOSIS — Z96652 Presence of left artificial knee joint: Secondary | ICD-10-CM | POA: Diagnosis not present

## 2024-04-26 ENCOUNTER — Ambulatory Visit: Payer: Medicare HMO | Admitting: Physical Therapy

## 2024-04-26 ENCOUNTER — Encounter: Payer: Medicare HMO | Admitting: Occupational Therapy

## 2024-04-26 DIAGNOSIS — M25662 Stiffness of left knee, not elsewhere classified: Secondary | ICD-10-CM | POA: Diagnosis not present

## 2024-04-26 DIAGNOSIS — Z96652 Presence of left artificial knee joint: Secondary | ICD-10-CM | POA: Diagnosis not present

## 2024-04-29 ENCOUNTER — Inpatient Hospital Stay: Payer: Medicare HMO | Attending: Oncology

## 2024-04-29 DIAGNOSIS — D75839 Thrombocytosis, unspecified: Secondary | ICD-10-CM | POA: Diagnosis not present

## 2024-04-29 DIAGNOSIS — D509 Iron deficiency anemia, unspecified: Secondary | ICD-10-CM | POA: Insufficient documentation

## 2024-04-29 DIAGNOSIS — M25662 Stiffness of left knee, not elsewhere classified: Secondary | ICD-10-CM | POA: Diagnosis not present

## 2024-04-29 DIAGNOSIS — Z96652 Presence of left artificial knee joint: Secondary | ICD-10-CM | POA: Diagnosis not present

## 2024-04-29 LAB — CBC WITH DIFFERENTIAL/PLATELET
Abs Immature Granulocytes: 0.07 10*3/uL (ref 0.00–0.07)
Basophils Absolute: 0.1 10*3/uL (ref 0.0–0.1)
Basophils Relative: 1 %
Eosinophils Absolute: 1 10*3/uL — ABNORMAL HIGH (ref 0.0–0.5)
Eosinophils Relative: 9 %
HCT: 40.7 % (ref 36.0–46.0)
Hemoglobin: 12.6 g/dL (ref 12.0–15.0)
Immature Granulocytes: 1 %
Lymphocytes Relative: 20 %
Lymphs Abs: 2.3 10*3/uL (ref 0.7–4.0)
MCH: 25.9 pg — ABNORMAL LOW (ref 26.0–34.0)
MCHC: 31 g/dL (ref 30.0–36.0)
MCV: 83.6 fL (ref 80.0–100.0)
Monocytes Absolute: 1.4 10*3/uL — ABNORMAL HIGH (ref 0.1–1.0)
Monocytes Relative: 12 %
Neutro Abs: 6.3 10*3/uL (ref 1.7–7.7)
Neutrophils Relative %: 57 %
Platelets: 670 10*3/uL — ABNORMAL HIGH (ref 150–400)
RBC: 4.87 MIL/uL (ref 3.87–5.11)
RDW: 15.9 % — ABNORMAL HIGH (ref 11.5–15.5)
WBC: 11.1 10*3/uL — ABNORMAL HIGH (ref 4.0–10.5)
nRBC: 0 % (ref 0.0–0.2)

## 2024-04-29 LAB — IRON AND TIBC
Iron: 29 ug/dL (ref 28–170)
Saturation Ratios: 9 % — ABNORMAL LOW (ref 10.4–31.8)
TIBC: 316 ug/dL (ref 250–450)
UIBC: 287 ug/dL

## 2024-04-29 LAB — SAMPLE TO BLOOD BANK

## 2024-04-29 LAB — FERRITIN: Ferritin: 81 ng/mL (ref 11–307)

## 2024-05-03 ENCOUNTER — Ambulatory Visit: Payer: Medicare HMO | Admitting: Physical Therapy

## 2024-05-03 ENCOUNTER — Inpatient Hospital Stay: Payer: Medicare HMO

## 2024-05-03 ENCOUNTER — Inpatient Hospital Stay (HOSPITAL_BASED_OUTPATIENT_CLINIC_OR_DEPARTMENT_OTHER): Payer: Medicare HMO | Admitting: Oncology

## 2024-05-03 ENCOUNTER — Encounter: Payer: Self-pay | Admitting: Oncology

## 2024-05-03 ENCOUNTER — Encounter: Payer: Medicare HMO | Admitting: Occupational Therapy

## 2024-05-03 VITALS — BP 108/55 | HR 71 | Temp 98.2°F | Resp 16 | Ht 59.0 in | Wt 97.0 lb

## 2024-05-03 DIAGNOSIS — D75839 Thrombocytosis, unspecified: Secondary | ICD-10-CM | POA: Diagnosis not present

## 2024-05-03 DIAGNOSIS — Z96652 Presence of left artificial knee joint: Secondary | ICD-10-CM | POA: Diagnosis not present

## 2024-05-03 DIAGNOSIS — D509 Iron deficiency anemia, unspecified: Secondary | ICD-10-CM

## 2024-05-03 DIAGNOSIS — M25662 Stiffness of left knee, not elsewhere classified: Secondary | ICD-10-CM | POA: Diagnosis not present

## 2024-05-03 MED ORDER — IRON SUCROSE 20 MG/ML IV SOLN
200.0000 mg | Freq: Once | INTRAVENOUS | Status: AC
Start: 2024-05-03 — End: 2024-05-03
  Administered 2024-05-03: 200 mg via INTRAVENOUS
  Filled 2024-05-03: qty 10

## 2024-05-03 NOTE — Progress Notes (Signed)
 [facility]  Telephone:([phone] Fax:(336) [phone]  ID: [name] OB: 09/22/1941  MR#: [id number]  QIO#:[id number]  Patient Care Team: Yehuda Helms, MD as PCP - General (Internal Medicine)  CHIEF COMPLAINT: Thrombocytosis, iron  deficiency anemia.  INTERVAL HISTORY: Patient returns to clinic today for repeat laboratory work, further evaluation, consideration of additional IV Venofer .  She recently underwent her knee replacement and tolerated the procedure well.  She no longer complains of any weakness or fatigue and feels nearly back to her baseline.  She has no neurologic complaints.  She denies any recent fevers or illnesses.   She has no chest pain, shortness of breath, cough, or hemoptysis.  She denies any nausea, vomiting, constipation, or diarrhea.  She has no melena or hematochezia.  She has no urinary complaints.  Patient offers no further specific complaints today.  REVIEW OF SYSTEMS:   Review of Systems  Constitutional: Negative.  Negative for fever, malaise/fatigue and weight loss.  Respiratory: Negative.  Negative for cough, hemoptysis and shortness of breath.   Cardiovascular: Negative.  Negative for chest pain and leg swelling.  Gastrointestinal: Negative.  Negative for abdominal pain, blood in stool and melena.  Genitourinary: Negative.  Negative for dysuria.  Musculoskeletal: Negative.  Negative for back pain and joint pain.  Skin: Negative.  Negative for rash.  Neurological: Negative.  Negative for dizziness, focal weakness, weakness and headaches.  Psychiatric/Behavioral: Negative.  The patient is not nervous/anxious.     As per HPI. Otherwise, a complete review of systems is negative.  PAST MEDICAL HISTORY: Past Medical History:  Diagnosis Date   Anemia    Arthritis    Asthma    uses inhaler just prior to surgery to avoid attack   Back pain    from previous injury   Cerebral microvascular disease    Chronic diastolic CHF  (congestive heart failure) (HCC)    Complication of anesthesia    has woken  up during 2 different surgery   Depression    Gallstones    GERD (gastroesophageal reflux disease)    Hiatal hernia    History of kidney stones    HLD (hyperlipidemia)    HTN (hypertension)    Hypothyroidism    Iron  deficiency anemia 12/2023   Kidney stones    Knee pain    Nausea and vomiting 10/15/2022   Non-diabetic pancreatic hormone dysfunction years   pt. states pancreas does not function properly   Ovarian cyst    Pancreatitis    Pneumonia, aspiration (HCC)    PONV (postoperative nausea and vomiting)    Seizures (HCC)    caused by dye injected during a procedure   Shortness of breath on exertion    Sinus problem    frequent infections/congestion   Stroke (HCC) 01/24/2020   a.) MRI brain 01/24/2020: small subacute infarct in the RIGHT frontal white matter   Stroke (HCC) 08/19/2022   a.) MRI brain 08/19/2022: acute infarcts in the RIGHT carona radiata and RIGHT anterior thalamus; chronic infarcts in the basal ganglia BILATERALLY   Thrombocytosis 12/2023   Vertebral artery stenosis, right    a.) MRI 12/03/2022: complete occlusion of the distal V2, V3, and V4 segments    PAST SURGICAL HISTORY: Past Surgical History:  Procedure Laterality Date   APPENDECTOMY  1965   BILATERAL SALPINGOOPHORECTOMY  1965   CARPAL TUNNEL RELEASE Bilateral    CATARACT EXTRACTION W/ INTRAOCULAR LENS  IMPLANT, BILATERAL Bilateral    FOOT OSTEOTOMY  6 weeks ago  Left foot: great, 2nd & 3rd   FOOT OSTEOTOMY  5 years ago   Right great toe   HAND SURGERY Bilateral 2011-most recent   multiple hand surgeries, 2 on left, 3 on right   KNEE ARTHROPLASTY Right 04/28/2022   Procedure: COMPUTER ASSISTED TOTAL KNEE ARTHROPLASTY;  Surgeon: Arlyne Lame, MD;  Location: [facility];  Service: Orthopedics;  Laterality: Right;   KNEE ARTHROPLASTY Left 03/02/2024   Procedure: ARTHROPLASTY, KNEE, TOTAL, USING IMAGELESS  COMPUTER-ASSISTED NAVIGATION;  Surgeon: Arlyne Lame, MD;  Location: [facility];  Service: Orthopedics;  Laterality: Left;   LOOP RECORDER INSERTION N/A 05/16/2020   Procedure: LOOP RECORDER INSERTION;  Surgeon: Percival Brace, MD;  Location: [facility];  Service: Cardiovascular;  Laterality: N/A;   NASAL SINUS SURGERY  most recent 7-8 yrs ago   7 sinus surgeries    PARTIAL HYSTERECTOMY  1963   TRIGGER FINGER RELEASE  11/19/2011   Procedure: RELEASE TRIGGER FINGER/A-1 PULLEY;  Surgeon: Kemp Patter, MD;  Location: [facility];  Service: Orthopedics;  Laterality: Right;  release a-1 pulley right index finger and cyst removal   WRIST GANGLION EXCISION  1980's   right    FAMILY HISTORY: Family History  Problem Relation Age of Onset   Anesthesia problems Father        "bad lungs" couldn't wake him up   Heart disease Father    Heart attack Father 74   Breast cancer Maternal Grandmother    Breast cancer Maternal Aunt        x 2   Breast cancer Cousin    Pancreatic cancer Cousin    Heart attack Paternal Uncle    Stroke Paternal Grandfather     ADVANCED DIRECTIVES (Y/N):  N  HEALTH MAINTENANCE: Social History   Tobacco Use   Smoking status: Never   Smokeless tobacco: Never  Vaping Use   Vaping status: Never Used  Substance Use Topics   Alcohol  use: Not Currently    Alcohol /week: 0.0 - 1.0 standard drinks of alcohol     Comment: wine once every 2-3 months   Drug use: Never     Colonoscopy:  PAP:  Bone density:  Lipid panel:  Allergies  Allergen Reactions   Cinobac [Cinoxacin] Anaphylaxis    "My throat swelled and I stopped breathing."   Latex Dermatitis    IgE = 14 (WNL) on 04/17/2022  Pt is highly reactive to any latex products. Blisters skin. "Takes my skin off"  "Skin will tears off"  IgE = 14 (WNL) on 04/17/2022  Pt is highly reactive to any latex products. Blisters skin. "Takes my skin off"   Other Shortness Of Breath    Muscle  relaxers? Pt thinks allergic to muscle relaxers, reports stops breathing but not sure what medicine and if for sure muscle relaxer  Petroleum, Chemicals, Paints, Muscle Relaxers   Petrolatum Distillates [Petroleum Distillate] Shortness Of Breath    Passes out    Sulfa Antibiotics Itching   Meloxicam     Other Reaction(s): Not available, Unknown   Misc. Sulfonamide Containing Compounds Other (See Comments)   Nylon Itching and Rash    Current Outpatient Medications  Medication Sig Dispense Refill   acetaminophen  (TYLENOL ) 325 MG tablet Take 2 tablets (650 mg total) by mouth every 4 (four) hours as needed for mild pain (or temp > 37.5 C (99.5 F)).     albuterol  (VENTOLIN  HFA) 108 (90 Base) MCG/ACT inhaler Inhale 2 puffs into the lungs every  4 (four) hours as needed for wheezing or shortness of breath. 8 g 0   aspirin  EC 81 MG tablet Take 1 tablet (81 mg total) by mouth 2 (two) times daily. Swallow whole.     atorvastatin  (LIPITOR) 40 MG tablet Take 1 tablet (40 mg total) by mouth daily. 30 tablet 0   celecoxib  (CELEBREX ) 200 MG capsule Take 1 capsule (200 mg total) by mouth 2 (two) times daily. 60 capsule 1   Cholecalciferol  (VITAMIN D3) 125 MCG (5000 UT) CAPS Take 1 capsule by mouth daily.     clopidogrel  (PLAVIX ) 75 MG tablet Take 1 tablet (75 mg total) by mouth daily. 30 tablet 0   diclofenac  Sodium (VOLTAREN ) 1 % GEL Apply 2 g topically 4 (four) times daily. (Patient taking differently: Apply 2 g topically daily as needed (pain).) 2 g 0   Ferrous Sulfate  (IRON  PO) Take 1 tablet by mouth daily. 9 mg gummy     folic acid  (FOLVITE ) 800 MCG tablet Take 1 tablet (800 mcg total) by mouth daily. (Patient taking differently: Take 3,200 mcg by mouth daily.) 30 tablet 0   gabapentin  (NEURONTIN ) 300 MG capsule Take 1 capsule (300 mg total) by mouth 2 (two) times daily. 60 capsule 0   KLOR-CON  M10 10 MEQ tablet Take 20 mEq by mouth at bedtime.     levothyroxine  (SYNTHROID ) 50 MCG tablet Take 50 mcg by  mouth daily before breakfast.     Magnesium  250 MG TABS Take 250 mg by mouth daily.     Multiple Vitamins-Minerals (HAIR SKIN & NAILS ADVANCED PO) Take 1 tablet by mouth daily.     Multiple Vitamins-Minerals (MULTIVITAMIN GUMMIES ADULT) CHEW Chew 2 capsules by mouth daily.     oxyCODONE  (OXY IR/ROXICODONE ) 5 MG immediate release tablet Take 1 tablet (5 mg total) by mouth every 4 (four) hours as needed for moderate pain (pain score 4-6) (pain score 4-6). 30 tablet 0   pantoprazole  (PROTONIX ) 40 MG tablet TAKE 1 TABLET BY MOUTH EVERY DAY 90 tablet 0   propranolol  ER (INDERAL  LA) 60 MG 24 hr capsule Take 1 capsule (60 mg total) by mouth daily. 30 capsule 0   traMADol  (ULTRAM ) 50 MG tablet Take 1-2 tablets (50-100 mg total) by mouth every 4 (four) hours as needed for moderate pain (pain score 4-6). 30 tablet 0   montelukast  (SINGULAIR ) 10 MG tablet Take 10 mg by mouth at bedtime.     No current facility-administered medications for this visit.   Facility-Administered Medications Ordered in Other Visits  Medication Dose Route Frequency Provider Last Rate Last Admin   0.9 %  sodium chloride  infusion (Manually program via Guardrails IV Fluids)  250 mL Intravenous Continuous Shellie Dials, MD 10 mL/hr at 01/25/24 0919 100 mL at 01/25/24 0919    OBJECTIVE: Vitals:   05/03/24 1010  BP: (!) 108/55  Pulse: 71  Resp: 16  Temp: 98.2 F (36.8 C)  SpO2: 100%     Body mass index is 19.59 kg/m.    ECOG FS:0 - Asymptomatic  General: Well-developed, well-nourished, no acute distress.  Sitting in a wheelchair. Eyes: Pink conjunctiva, anicteric sclera. HEENT: Normocephalic, moist mucous membranes. Lungs: No audible wheezing or coughing. Heart: Regular rate and rhythm. Abdomen: Soft, nontender, no obvious distention. Musculoskeletal: No edema, cyanosis, or clubbing. Neuro: Alert, answering all questions appropriately. Cranial nerves grossly intact. Skin: No rashes or petechiae noted. Psych:  Normal affect.  LAB RESULTS:  Lab Results  Component Value Date   NA  136 03/10/2024   K 3.4 (L) 03/10/2024   CL 102 03/10/2024   CO2 24 03/10/2024   GLUCOSE 77 03/10/2024   BUN 12 03/10/2024   CREATININE 1.06 (H) 03/10/2024   CALCIUM  8.5 (L) 03/10/2024   PROT 6.7 02/23/2024   ALBUMIN 3.7 02/23/2024   AST 20 02/23/2024   ALT 11 02/23/2024   ALKPHOS 87 02/23/2024   BILITOT 0.8 02/23/2024   GFRNONAA 53 (L) 03/10/2024   GFRAA >60 09/28/2016    Lab Results  Component Value Date   WBC 11.1 (H) 04/29/2024   NEUTROABS 6.3 04/29/2024   HGB 12.6 04/29/2024   HCT 40.7 04/29/2024   MCV 83.6 04/29/2024   PLT 670 (H) 04/29/2024   Lab Results  Component Value Date   FERRITIN 81 04/29/2024   Lab Results  Component Value Date   IRON  29 04/29/2024   TIBC 316 04/29/2024   IRONPCTSAT 9 (L) 04/29/2024     STUDIES: No results found.   ASSESSMENT: Thrombocytosis, iron  deficiency anemia.  PLAN:    Iron  deficiency anemia: Resolved.  Patient's hemoglobin is now within normal limits.  Her iron  saturation remains reduced, therefore we will proceed with 1 additional dose of 200 mg IV Venofer  today.  She does not require additional doses at this time.  Return to clinic in 4 months with repeat laboratory work, further evaluation, and continuation of treatment if necessary.   Thrombocytosis: Chronic and unchanged.  Previously, all of her other blood work including JAK2 mutation with reflex, flow cytometry, BCR-ABL mutation, and IntelliGEN myeloid panel were all either negative or within normal limits. Joint pain: Resolved.  Patient has now undergone her joint replacement surgery.  I spent a total of 30 minutes reviewing chart data, face-to-face evaluation with the patient, counseling and coordination of care as detailed above.   Patient expressed understanding and was in agreement with this plan. She also understands that She can call clinic at any time with any questions, concerns, or  complaints.    Shellie Dials, MD   05/03/2024 12:04 PM

## 2024-05-05 DIAGNOSIS — Z96652 Presence of left artificial knee joint: Secondary | ICD-10-CM | POA: Diagnosis not present

## 2024-05-09 DIAGNOSIS — Z96652 Presence of left artificial knee joint: Secondary | ICD-10-CM | POA: Diagnosis not present

## 2024-05-10 ENCOUNTER — Ambulatory Visit: Payer: Medicare HMO | Admitting: Physical Therapy

## 2024-05-10 ENCOUNTER — Encounter: Payer: Medicare HMO | Admitting: Occupational Therapy

## 2024-05-11 DIAGNOSIS — I10 Essential (primary) hypertension: Secondary | ICD-10-CM | POA: Diagnosis not present

## 2024-05-11 DIAGNOSIS — Z79899 Other long term (current) drug therapy: Secondary | ICD-10-CM | POA: Diagnosis not present

## 2024-05-11 DIAGNOSIS — E039 Hypothyroidism, unspecified: Secondary | ICD-10-CM | POA: Diagnosis not present

## 2024-05-11 DIAGNOSIS — E781 Pure hyperglyceridemia: Secondary | ICD-10-CM | POA: Diagnosis not present

## 2024-05-11 DIAGNOSIS — R7303 Prediabetes: Secondary | ICD-10-CM | POA: Diagnosis not present

## 2024-05-12 DIAGNOSIS — R8290 Unspecified abnormal findings in urine: Secondary | ICD-10-CM | POA: Diagnosis not present

## 2024-05-13 DIAGNOSIS — M25662 Stiffness of left knee, not elsewhere classified: Secondary | ICD-10-CM | POA: Diagnosis not present

## 2024-05-13 DIAGNOSIS — Z96652 Presence of left artificial knee joint: Secondary | ICD-10-CM | POA: Diagnosis not present

## 2024-05-17 ENCOUNTER — Encounter: Payer: Medicare HMO | Admitting: Occupational Therapy

## 2024-05-17 ENCOUNTER — Ambulatory Visit: Payer: Medicare HMO | Admitting: Physical Therapy

## 2024-05-24 ENCOUNTER — Ambulatory Visit: Payer: Medicare HMO | Admitting: Physical Therapy

## 2024-05-24 ENCOUNTER — Encounter: Payer: Medicare HMO | Admitting: Occupational Therapy

## 2024-05-31 ENCOUNTER — Encounter: Payer: Medicare HMO | Admitting: Occupational Therapy

## 2024-05-31 ENCOUNTER — Ambulatory Visit: Payer: Medicare HMO | Admitting: Physical Therapy

## 2024-06-07 ENCOUNTER — Encounter: Payer: Medicare HMO | Admitting: Occupational Therapy

## 2024-06-07 ENCOUNTER — Ambulatory Visit: Payer: Medicare HMO | Admitting: Physical Therapy

## 2024-06-14 ENCOUNTER — Ambulatory Visit: Payer: Medicare HMO | Admitting: Physical Therapy

## 2024-06-14 ENCOUNTER — Encounter: Payer: Medicare HMO | Admitting: Occupational Therapy

## 2024-06-21 ENCOUNTER — Encounter: Payer: Medicare HMO | Admitting: Occupational Therapy

## 2024-06-21 ENCOUNTER — Ambulatory Visit: Payer: Medicare HMO | Admitting: Physical Therapy

## 2024-06-28 ENCOUNTER — Ambulatory Visit: Payer: Medicare HMO | Admitting: Physical Therapy

## 2024-06-28 ENCOUNTER — Encounter: Payer: Medicare HMO | Admitting: Occupational Therapy

## 2024-06-29 DIAGNOSIS — D500 Iron deficiency anemia secondary to blood loss (chronic): Secondary | ICD-10-CM | POA: Diagnosis not present

## 2024-06-29 DIAGNOSIS — I129 Hypertensive chronic kidney disease with stage 1 through stage 4 chronic kidney disease, or unspecified chronic kidney disease: Secondary | ICD-10-CM | POA: Diagnosis not present

## 2024-06-29 DIAGNOSIS — N1831 Chronic kidney disease, stage 3a: Secondary | ICD-10-CM | POA: Diagnosis not present

## 2024-07-15 DIAGNOSIS — M6284 Sarcopenia: Secondary | ICD-10-CM | POA: Diagnosis not present

## 2024-07-15 DIAGNOSIS — M5030 Other cervical disc degeneration, unspecified cervical region: Secondary | ICD-10-CM | POA: Diagnosis not present

## 2024-07-15 DIAGNOSIS — Z7409 Other reduced mobility: Secondary | ICD-10-CM | POA: Diagnosis not present

## 2024-07-15 DIAGNOSIS — Z1331 Encounter for screening for depression: Secondary | ICD-10-CM | POA: Diagnosis not present

## 2024-07-15 DIAGNOSIS — R202 Paresthesia of skin: Secondary | ICD-10-CM | POA: Diagnosis not present

## 2024-07-15 DIAGNOSIS — I6389 Other cerebral infarction: Secondary | ICD-10-CM | POA: Diagnosis not present

## 2024-07-15 DIAGNOSIS — R200 Anesthesia of skin: Secondary | ICD-10-CM | POA: Diagnosis not present

## 2024-07-16 DIAGNOSIS — M4802 Spinal stenosis, cervical region: Secondary | ICD-10-CM | POA: Diagnosis not present

## 2024-07-16 DIAGNOSIS — F419 Anxiety disorder, unspecified: Secondary | ICD-10-CM | POA: Diagnosis not present

## 2024-07-16 DIAGNOSIS — I69354 Hemiplegia and hemiparesis following cerebral infarction affecting left non-dominant side: Secondary | ICD-10-CM | POA: Diagnosis not present

## 2024-07-16 DIAGNOSIS — I10 Essential (primary) hypertension: Secondary | ICD-10-CM | POA: Diagnosis not present

## 2024-07-16 DIAGNOSIS — G8929 Other chronic pain: Secondary | ICD-10-CM | POA: Diagnosis not present

## 2024-07-16 DIAGNOSIS — M6284 Sarcopenia: Secondary | ICD-10-CM | POA: Diagnosis not present

## 2024-07-16 DIAGNOSIS — M5030 Other cervical disc degeneration, unspecified cervical region: Secondary | ICD-10-CM | POA: Diagnosis not present

## 2024-07-16 DIAGNOSIS — I69392 Facial weakness following cerebral infarction: Secondary | ICD-10-CM | POA: Diagnosis not present

## 2024-07-16 DIAGNOSIS — I69322 Dysarthria following cerebral infarction: Secondary | ICD-10-CM | POA: Diagnosis not present

## 2024-07-20 DIAGNOSIS — M4802 Spinal stenosis, cervical region: Secondary | ICD-10-CM | POA: Diagnosis not present

## 2024-07-20 DIAGNOSIS — I69392 Facial weakness following cerebral infarction: Secondary | ICD-10-CM | POA: Diagnosis not present

## 2024-07-20 DIAGNOSIS — M5030 Other cervical disc degeneration, unspecified cervical region: Secondary | ICD-10-CM | POA: Diagnosis not present

## 2024-07-20 DIAGNOSIS — M6284 Sarcopenia: Secondary | ICD-10-CM | POA: Diagnosis not present

## 2024-07-20 DIAGNOSIS — F419 Anxiety disorder, unspecified: Secondary | ICD-10-CM | POA: Diagnosis not present

## 2024-07-20 DIAGNOSIS — I10 Essential (primary) hypertension: Secondary | ICD-10-CM | POA: Diagnosis not present

## 2024-07-20 DIAGNOSIS — I69322 Dysarthria following cerebral infarction: Secondary | ICD-10-CM | POA: Diagnosis not present

## 2024-07-20 DIAGNOSIS — G8929 Other chronic pain: Secondary | ICD-10-CM | POA: Diagnosis not present

## 2024-07-20 DIAGNOSIS — I69354 Hemiplegia and hemiparesis following cerebral infarction affecting left non-dominant side: Secondary | ICD-10-CM | POA: Diagnosis not present

## 2024-07-21 DIAGNOSIS — I69392 Facial weakness following cerebral infarction: Secondary | ICD-10-CM | POA: Diagnosis not present

## 2024-07-21 DIAGNOSIS — G8929 Other chronic pain: Secondary | ICD-10-CM | POA: Diagnosis not present

## 2024-07-21 DIAGNOSIS — I10 Essential (primary) hypertension: Secondary | ICD-10-CM | POA: Diagnosis not present

## 2024-07-21 DIAGNOSIS — M6284 Sarcopenia: Secondary | ICD-10-CM | POA: Diagnosis not present

## 2024-07-21 DIAGNOSIS — I69354 Hemiplegia and hemiparesis following cerebral infarction affecting left non-dominant side: Secondary | ICD-10-CM | POA: Diagnosis not present

## 2024-07-21 DIAGNOSIS — M5030 Other cervical disc degeneration, unspecified cervical region: Secondary | ICD-10-CM | POA: Diagnosis not present

## 2024-07-21 DIAGNOSIS — M4802 Spinal stenosis, cervical region: Secondary | ICD-10-CM | POA: Diagnosis not present

## 2024-07-21 DIAGNOSIS — F419 Anxiety disorder, unspecified: Secondary | ICD-10-CM | POA: Diagnosis not present

## 2024-07-21 DIAGNOSIS — I69322 Dysarthria following cerebral infarction: Secondary | ICD-10-CM | POA: Diagnosis not present

## 2024-07-27 DIAGNOSIS — I69392 Facial weakness following cerebral infarction: Secondary | ICD-10-CM | POA: Diagnosis not present

## 2024-07-27 DIAGNOSIS — M6284 Sarcopenia: Secondary | ICD-10-CM | POA: Diagnosis not present

## 2024-07-27 DIAGNOSIS — M5030 Other cervical disc degeneration, unspecified cervical region: Secondary | ICD-10-CM | POA: Diagnosis not present

## 2024-07-27 DIAGNOSIS — I69354 Hemiplegia and hemiparesis following cerebral infarction affecting left non-dominant side: Secondary | ICD-10-CM | POA: Diagnosis not present

## 2024-07-27 DIAGNOSIS — G8929 Other chronic pain: Secondary | ICD-10-CM | POA: Diagnosis not present

## 2024-07-27 DIAGNOSIS — I69322 Dysarthria following cerebral infarction: Secondary | ICD-10-CM | POA: Diagnosis not present

## 2024-07-27 DIAGNOSIS — I10 Essential (primary) hypertension: Secondary | ICD-10-CM | POA: Diagnosis not present

## 2024-07-29 DIAGNOSIS — I69322 Dysarthria following cerebral infarction: Secondary | ICD-10-CM | POA: Diagnosis not present

## 2024-07-29 DIAGNOSIS — M4802 Spinal stenosis, cervical region: Secondary | ICD-10-CM | POA: Diagnosis not present

## 2024-07-29 DIAGNOSIS — I69354 Hemiplegia and hemiparesis following cerebral infarction affecting left non-dominant side: Secondary | ICD-10-CM | POA: Diagnosis not present

## 2024-07-29 DIAGNOSIS — I10 Essential (primary) hypertension: Secondary | ICD-10-CM | POA: Diagnosis not present

## 2024-07-29 DIAGNOSIS — M6284 Sarcopenia: Secondary | ICD-10-CM | POA: Diagnosis not present

## 2024-07-29 DIAGNOSIS — G8929 Other chronic pain: Secondary | ICD-10-CM | POA: Diagnosis not present

## 2024-07-29 DIAGNOSIS — I69392 Facial weakness following cerebral infarction: Secondary | ICD-10-CM | POA: Diagnosis not present

## 2024-07-29 DIAGNOSIS — M5030 Other cervical disc degeneration, unspecified cervical region: Secondary | ICD-10-CM | POA: Diagnosis not present

## 2024-07-29 DIAGNOSIS — F419 Anxiety disorder, unspecified: Secondary | ICD-10-CM | POA: Diagnosis not present

## 2024-08-03 DIAGNOSIS — M4802 Spinal stenosis, cervical region: Secondary | ICD-10-CM | POA: Diagnosis not present

## 2024-08-03 DIAGNOSIS — M5030 Other cervical disc degeneration, unspecified cervical region: Secondary | ICD-10-CM | POA: Diagnosis not present

## 2024-08-03 DIAGNOSIS — I69392 Facial weakness following cerebral infarction: Secondary | ICD-10-CM | POA: Diagnosis not present

## 2024-08-03 DIAGNOSIS — I10 Essential (primary) hypertension: Secondary | ICD-10-CM | POA: Diagnosis not present

## 2024-08-03 DIAGNOSIS — I69354 Hemiplegia and hemiparesis following cerebral infarction affecting left non-dominant side: Secondary | ICD-10-CM | POA: Diagnosis not present

## 2024-08-03 DIAGNOSIS — G8929 Other chronic pain: Secondary | ICD-10-CM | POA: Diagnosis not present

## 2024-08-03 DIAGNOSIS — I69322 Dysarthria following cerebral infarction: Secondary | ICD-10-CM | POA: Diagnosis not present

## 2024-08-03 DIAGNOSIS — F419 Anxiety disorder, unspecified: Secondary | ICD-10-CM | POA: Diagnosis not present

## 2024-08-03 DIAGNOSIS — M6284 Sarcopenia: Secondary | ICD-10-CM | POA: Diagnosis not present

## 2024-08-08 DIAGNOSIS — I69354 Hemiplegia and hemiparesis following cerebral infarction affecting left non-dominant side: Secondary | ICD-10-CM | POA: Diagnosis not present

## 2024-08-08 DIAGNOSIS — M6284 Sarcopenia: Secondary | ICD-10-CM | POA: Diagnosis not present

## 2024-08-08 DIAGNOSIS — M5030 Other cervical disc degeneration, unspecified cervical region: Secondary | ICD-10-CM | POA: Diagnosis not present

## 2024-08-08 DIAGNOSIS — M4802 Spinal stenosis, cervical region: Secondary | ICD-10-CM | POA: Diagnosis not present

## 2024-08-08 DIAGNOSIS — G8929 Other chronic pain: Secondary | ICD-10-CM | POA: Diagnosis not present

## 2024-08-08 DIAGNOSIS — I69322 Dysarthria following cerebral infarction: Secondary | ICD-10-CM | POA: Diagnosis not present

## 2024-08-08 DIAGNOSIS — I69392 Facial weakness following cerebral infarction: Secondary | ICD-10-CM | POA: Diagnosis not present

## 2024-08-08 DIAGNOSIS — F419 Anxiety disorder, unspecified: Secondary | ICD-10-CM | POA: Diagnosis not present

## 2024-08-08 DIAGNOSIS — I10 Essential (primary) hypertension: Secondary | ICD-10-CM | POA: Diagnosis not present

## 2024-08-12 DIAGNOSIS — I69322 Dysarthria following cerebral infarction: Secondary | ICD-10-CM | POA: Diagnosis not present

## 2024-08-12 DIAGNOSIS — F419 Anxiety disorder, unspecified: Secondary | ICD-10-CM | POA: Diagnosis not present

## 2024-08-12 DIAGNOSIS — G8929 Other chronic pain: Secondary | ICD-10-CM | POA: Diagnosis not present

## 2024-08-12 DIAGNOSIS — M5030 Other cervical disc degeneration, unspecified cervical region: Secondary | ICD-10-CM | POA: Diagnosis not present

## 2024-08-12 DIAGNOSIS — M4802 Spinal stenosis, cervical region: Secondary | ICD-10-CM | POA: Diagnosis not present

## 2024-08-12 DIAGNOSIS — M6284 Sarcopenia: Secondary | ICD-10-CM | POA: Diagnosis not present

## 2024-08-12 DIAGNOSIS — I69354 Hemiplegia and hemiparesis following cerebral infarction affecting left non-dominant side: Secondary | ICD-10-CM | POA: Diagnosis not present

## 2024-08-12 DIAGNOSIS — I10 Essential (primary) hypertension: Secondary | ICD-10-CM | POA: Diagnosis not present

## 2024-08-12 DIAGNOSIS — I69392 Facial weakness following cerebral infarction: Secondary | ICD-10-CM | POA: Diagnosis not present

## 2024-08-18 DIAGNOSIS — I69354 Hemiplegia and hemiparesis following cerebral infarction affecting left non-dominant side: Secondary | ICD-10-CM | POA: Diagnosis not present

## 2024-08-18 DIAGNOSIS — I69392 Facial weakness following cerebral infarction: Secondary | ICD-10-CM | POA: Diagnosis not present

## 2024-08-18 DIAGNOSIS — M6284 Sarcopenia: Secondary | ICD-10-CM | POA: Diagnosis not present

## 2024-08-18 DIAGNOSIS — I69322 Dysarthria following cerebral infarction: Secondary | ICD-10-CM | POA: Diagnosis not present

## 2024-08-18 DIAGNOSIS — M4802 Spinal stenosis, cervical region: Secondary | ICD-10-CM | POA: Diagnosis not present

## 2024-08-18 DIAGNOSIS — F419 Anxiety disorder, unspecified: Secondary | ICD-10-CM | POA: Diagnosis not present

## 2024-08-18 DIAGNOSIS — M5030 Other cervical disc degeneration, unspecified cervical region: Secondary | ICD-10-CM | POA: Diagnosis not present

## 2024-08-18 DIAGNOSIS — G8929 Other chronic pain: Secondary | ICD-10-CM | POA: Diagnosis not present

## 2024-08-18 DIAGNOSIS — I10 Essential (primary) hypertension: Secondary | ICD-10-CM | POA: Diagnosis not present

## 2024-08-30 ENCOUNTER — Other Ambulatory Visit: Payer: Self-pay

## 2024-08-30 DIAGNOSIS — D509 Iron deficiency anemia, unspecified: Secondary | ICD-10-CM

## 2024-08-30 DIAGNOSIS — D75839 Thrombocytosis, unspecified: Secondary | ICD-10-CM

## 2024-08-31 ENCOUNTER — Inpatient Hospital Stay: Attending: Oncology

## 2024-08-31 DIAGNOSIS — I69354 Hemiplegia and hemiparesis following cerebral infarction affecting left non-dominant side: Secondary | ICD-10-CM | POA: Diagnosis not present

## 2024-08-31 DIAGNOSIS — M5030 Other cervical disc degeneration, unspecified cervical region: Secondary | ICD-10-CM | POA: Diagnosis not present

## 2024-08-31 DIAGNOSIS — G8929 Other chronic pain: Secondary | ICD-10-CM | POA: Diagnosis not present

## 2024-08-31 DIAGNOSIS — Z803 Family history of malignant neoplasm of breast: Secondary | ICD-10-CM | POA: Diagnosis not present

## 2024-08-31 DIAGNOSIS — F419 Anxiety disorder, unspecified: Secondary | ICD-10-CM | POA: Diagnosis not present

## 2024-08-31 DIAGNOSIS — I69322 Dysarthria following cerebral infarction: Secondary | ICD-10-CM | POA: Diagnosis not present

## 2024-08-31 DIAGNOSIS — D75839 Thrombocytosis, unspecified: Secondary | ICD-10-CM | POA: Insufficient documentation

## 2024-08-31 DIAGNOSIS — M6284 Sarcopenia: Secondary | ICD-10-CM | POA: Diagnosis not present

## 2024-08-31 DIAGNOSIS — Z800 Family history of malignant neoplasm of digestive organs: Secondary | ICD-10-CM | POA: Insufficient documentation

## 2024-08-31 DIAGNOSIS — I10 Essential (primary) hypertension: Secondary | ICD-10-CM | POA: Diagnosis not present

## 2024-08-31 DIAGNOSIS — I69392 Facial weakness following cerebral infarction: Secondary | ICD-10-CM | POA: Diagnosis not present

## 2024-08-31 DIAGNOSIS — M4802 Spinal stenosis, cervical region: Secondary | ICD-10-CM | POA: Diagnosis not present

## 2024-08-31 DIAGNOSIS — D509 Iron deficiency anemia, unspecified: Secondary | ICD-10-CM | POA: Insufficient documentation

## 2024-08-31 LAB — CBC WITH DIFFERENTIAL (CANCER CENTER ONLY)
Abs Immature Granulocytes: 0.06 K/uL (ref 0.00–0.07)
Basophils Absolute: 0.1 K/uL (ref 0.0–0.1)
Basophils Relative: 1 %
Eosinophils Absolute: 0.7 K/uL — ABNORMAL HIGH (ref 0.0–0.5)
Eosinophils Relative: 6 %
HCT: 36 % (ref 36.0–46.0)
Hemoglobin: 11.1 g/dL — ABNORMAL LOW (ref 12.0–15.0)
Immature Granulocytes: 1 %
Lymphocytes Relative: 17 %
Lymphs Abs: 2 K/uL (ref 0.7–4.0)
MCH: 26.9 pg (ref 26.0–34.0)
MCHC: 30.8 g/dL (ref 30.0–36.0)
MCV: 87.2 fL (ref 80.0–100.0)
Monocytes Absolute: 1.4 K/uL — ABNORMAL HIGH (ref 0.1–1.0)
Monocytes Relative: 12 %
Neutro Abs: 7.4 K/uL (ref 1.7–7.7)
Neutrophils Relative %: 63 %
Platelet Count: 645 K/uL — ABNORMAL HIGH (ref 150–400)
RBC: 4.13 MIL/uL (ref 3.87–5.11)
RDW: 15.6 % — ABNORMAL HIGH (ref 11.5–15.5)
WBC Count: 11.6 K/uL — ABNORMAL HIGH (ref 4.0–10.5)
nRBC: 0 % (ref 0.0–0.2)

## 2024-08-31 LAB — IRON AND TIBC
Iron: 74 ug/dL (ref 28–170)
Saturation Ratios: 23 % (ref 10.4–31.8)
TIBC: 322 ug/dL (ref 250–450)
UIBC: 248 ug/dL

## 2024-08-31 LAB — FERRITIN: Ferritin: 44 ng/mL (ref 11–307)

## 2024-09-01 DIAGNOSIS — M4802 Spinal stenosis, cervical region: Secondary | ICD-10-CM | POA: Diagnosis not present

## 2024-09-01 DIAGNOSIS — M6284 Sarcopenia: Secondary | ICD-10-CM | POA: Diagnosis not present

## 2024-09-01 DIAGNOSIS — I69354 Hemiplegia and hemiparesis following cerebral infarction affecting left non-dominant side: Secondary | ICD-10-CM | POA: Diagnosis not present

## 2024-09-01 DIAGNOSIS — I69322 Dysarthria following cerebral infarction: Secondary | ICD-10-CM | POA: Diagnosis not present

## 2024-09-01 DIAGNOSIS — G8929 Other chronic pain: Secondary | ICD-10-CM | POA: Diagnosis not present

## 2024-09-01 DIAGNOSIS — I10 Essential (primary) hypertension: Secondary | ICD-10-CM | POA: Diagnosis not present

## 2024-09-01 DIAGNOSIS — I69392 Facial weakness following cerebral infarction: Secondary | ICD-10-CM | POA: Diagnosis not present

## 2024-09-01 DIAGNOSIS — M5030 Other cervical disc degeneration, unspecified cervical region: Secondary | ICD-10-CM | POA: Diagnosis not present

## 2024-09-02 ENCOUNTER — Inpatient Hospital Stay

## 2024-09-02 ENCOUNTER — Inpatient Hospital Stay: Admitting: Oncology

## 2024-09-02 ENCOUNTER — Encounter: Payer: Self-pay | Admitting: Oncology

## 2024-09-02 VITALS — BP 117/54 | HR 80 | Temp 97.9°F | Resp 18 | Ht 59.0 in | Wt 97.6 lb

## 2024-09-02 DIAGNOSIS — D509 Iron deficiency anemia, unspecified: Secondary | ICD-10-CM | POA: Diagnosis not present

## 2024-09-02 DIAGNOSIS — D75839 Thrombocytosis, unspecified: Secondary | ICD-10-CM | POA: Diagnosis not present

## 2024-09-02 DIAGNOSIS — Z800 Family history of malignant neoplasm of digestive organs: Secondary | ICD-10-CM | POA: Diagnosis not present

## 2024-09-02 DIAGNOSIS — Z803 Family history of malignant neoplasm of breast: Secondary | ICD-10-CM | POA: Diagnosis not present

## 2024-09-02 NOTE — Progress Notes (Signed)
 [facility]  Telephone:([phone] Fax:(336) [phone]  ID: GISSEL KEILMAN OB: February 20, 1941  MR#: [id number]  RDW#:[id number]  Patient Care Team: Auston Reyes BIRCH, MD as PCP - General (Internal Medicine)  CHIEF COMPLAINT: Thrombocytosis, iron  deficiency anemia.  INTERVAL HISTORY: Patient returns to clinic today for repeat laboratory, further evaluation, and consideration of additional IV Venofer .  She currently feels well and is asymptomatic.  She does not complain of any weakness or fatigue today.  She has no neurologic complaints.  She denies any recent fevers or illnesses.   She has no chest pain, shortness of breath, cough, or hemoptysis.  She denies any nausea, vomiting, constipation, or diarrhea.  She has no melena or hematochezia.  She has no urinary complaints.  Patient offers no specific complaints today.  REVIEW OF SYSTEMS:   Review of Systems  Constitutional: Negative.  Negative for fever, malaise/fatigue and weight loss.  Respiratory: Negative.  Negative for cough, hemoptysis and shortness of breath.   Cardiovascular: Negative.  Negative for chest pain and leg swelling.  Gastrointestinal: Negative.  Negative for abdominal pain, blood in stool and melena.  Genitourinary: Negative.  Negative for dysuria.  Musculoskeletal: Negative.  Negative for back pain and joint pain.  Skin: Negative.  Negative for rash.  Neurological: Negative.  Negative for dizziness, focal weakness, weakness and headaches.  Psychiatric/Behavioral: Negative.  The patient is not nervous/anxious.     As per HPI. Otherwise, a complete review of systems is negative.  PAST MEDICAL HISTORY: Past Medical History:  Diagnosis Date   Anemia    Arthritis    Asthma    uses inhaler just prior to surgery to avoid attack   Back pain    from previous injury   Cerebral microvascular disease    Chronic diastolic CHF (congestive heart failure) (HCC)    Complication of anesthesia    has woken  up  during 2 different surgery   Depression    Gallstones    GERD (gastroesophageal reflux disease)    Hiatal hernia    History of kidney stones    HLD (hyperlipidemia)    HTN (hypertension)    Hypothyroidism    Iron  deficiency anemia 12/2023   Kidney stones    Knee pain    Nausea and vomiting 10/15/2022   Non-diabetic pancreatic hormone dysfunction years   pt. states pancreas does not function properly   Ovarian cyst    Pancreatitis    Pneumonia, aspiration (HCC)    PONV (postoperative nausea and vomiting)    Seizures (HCC)    caused by dye injected during a procedure   Shortness of breath on exertion    Sinus problem    frequent infections/congestion   Stroke (HCC) 01/24/2020   a.) MRI brain 01/24/2020: small subacute infarct in the RIGHT frontal white matter   Stroke (HCC) 08/19/2022   a.) MRI brain 08/19/2022: acute infarcts in the RIGHT carona radiata and RIGHT anterior thalamus; chronic infarcts in the basal ganglia BILATERALLY   Thrombocytosis 12/2023   Vertebral artery stenosis, right    a.) MRI 12/03/2022: complete occlusion of the distal V2, V3, and V4 segments    PAST SURGICAL HISTORY: Past Surgical History:  Procedure Laterality Date   APPENDECTOMY  1965   BILATERAL SALPINGOOPHORECTOMY  1965   CARPAL TUNNEL RELEASE Bilateral    CATARACT EXTRACTION W/ INTRAOCULAR LENS  IMPLANT, BILATERAL Bilateral    FOOT OSTEOTOMY  6 weeks ago   Left foot: great, 2nd & 3rd   FOOT  OSTEOTOMY  5 years ago   Right great toe   HAND SURGERY Bilateral 2011-most recent   multiple hand surgeries, 2 on left, 3 on right   KNEE ARTHROPLASTY Right 04/28/2022   Procedure: COMPUTER ASSISTED TOTAL KNEE ARTHROPLASTY;  Surgeon: Mardee Lynwood SQUIBB, MD;  Location: [facility];  Service: Orthopedics;  Laterality: Right;   KNEE ARTHROPLASTY Left 03/02/2024   Procedure: ARTHROPLASTY, KNEE, TOTAL, USING IMAGELESS COMPUTER-ASSISTED NAVIGATION;  Surgeon: Mardee Lynwood SQUIBB, MD;  Location: [facility];  Service:  Orthopedics;  Laterality: Left;   LOOP RECORDER INSERTION N/A 05/16/2020   Procedure: LOOP RECORDER INSERTION;  Surgeon: Ammon Blunt, MD;  Location: [facility];  Service: Cardiovascular;  Laterality: N/A;   NASAL SINUS SURGERY  most recent 7-8 yrs ago   7 sinus surgeries    PARTIAL HYSTERECTOMY  1963   TRIGGER FINGER RELEASE  11/19/2011   Procedure: RELEASE TRIGGER FINGER/A-1 PULLEY;  Surgeon: Arley JONELLE Curia, MD;  Location: [facility];  Service: Orthopedics;  Laterality: Right;  release a-1 pulley right index finger and cyst removal   WRIST GANGLION EXCISION  1980's   right    FAMILY HISTORY: Family History  Problem Relation Age of Onset   Anesthesia problems Father        bad lungs couldn't wake him up   Heart disease Father    Heart attack Father 33   Breast cancer Maternal Grandmother    Breast cancer Maternal Aunt        x 2   Breast cancer Cousin    Pancreatic cancer Cousin    Heart attack Paternal Uncle    Stroke Paternal Grandfather     ADVANCED DIRECTIVES (Y/N):  N  HEALTH MAINTENANCE: Social History   Tobacco Use   Smoking status: Never   Smokeless tobacco: Never  Vaping Use   Vaping status: Never Used  Substance Use Topics   Alcohol  use: Not Currently    Alcohol /week: 0.0 - 1.0 standard drinks of alcohol     Comment: wine once every 2-3 months   Drug use: Never     Colonoscopy:  PAP:  Bone density:  Lipid panel:  Allergies  Allergen Reactions   Cinobac [Cinoxacin] Anaphylaxis    My throat swelled and I stopped breathing.   Latex Dermatitis    IgE = 14 (WNL) on 04/17/2022  Pt is highly reactive to any latex products. Blisters skin. Takes my skin off  Skin will tears off  IgE = 14 (WNL) on 04/17/2022  Pt is highly reactive to any latex products. Blisters skin. Takes my skin off   Other Shortness Of Breath    Muscle relaxers? Pt thinks allergic to muscle relaxers, reports stops breathing but not sure what  medicine and if for sure muscle relaxer  Petroleum, Chemicals, Paints, Muscle Relaxers   Petrolatum Distillates [Petroleum Distillate] Shortness Of Breath    Passes out    Sulfa Antibiotics Itching   Meloxicam     Other Reaction(s): Not available, Unknown   Misc. Sulfonamide Containing Compounds Other (See Comments)   Nylon Itching and Rash    Current Outpatient Medications  Medication Sig Dispense Refill   acetaminophen  (TYLENOL ) 325 MG tablet Take 2 tablets (650 mg total) by mouth every 4 (four) hours as needed for mild pain (or temp > 37.5 C (99.5 F)).     albuterol  (VENTOLIN  HFA) 108 (90 Base) MCG/ACT inhaler Inhale 2 puffs into the lungs every 4 (four) hours as needed for wheezing or shortness  of breath. 8 g 0   aspirin  EC 81 MG tablet Take 1 tablet (81 mg total) by mouth 2 (two) times daily. Swallow whole.     atorvastatin  (LIPITOR) 40 MG tablet Take 1 tablet (40 mg total) by mouth daily. 30 tablet 0   celecoxib  (CELEBREX ) 200 MG capsule Take 1 capsule (200 mg total) by mouth 2 (two) times daily. 60 capsule 1   Cholecalciferol  (VITAMIN D3) 125 MCG (5000 UT) CAPS Take 1 capsule by mouth daily.     clopidogrel  (PLAVIX ) 75 MG tablet Take 1 tablet (75 mg total) by mouth daily. 30 tablet 0   diclofenac  Sodium (VOLTAREN ) 1 % GEL Apply 2 g topically 4 (four) times daily. (Patient taking differently: Apply 2 g topically daily as needed (pain).) 2 g 0   Ferrous Sulfate  (IRON  PO) Take 1 tablet by mouth daily. 9 mg gummy     folic acid  (FOLVITE ) 800 MCG tablet Take 1 tablet (800 mcg total) by mouth daily. (Patient taking differently: Take 3,200 mcg by mouth daily.) 30 tablet 0   gabapentin  (NEURONTIN ) 300 MG capsule Take 1 capsule (300 mg total) by mouth 2 (two) times daily. 60 capsule 0   KLOR-CON  M10 10 MEQ tablet Take 20 mEq by mouth at bedtime.     levothyroxine  (SYNTHROID ) 50 MCG tablet Take 50 mcg by mouth daily before breakfast.     Magnesium  250 MG TABS Take 250 mg by mouth daily.      montelukast  (SINGULAIR ) 10 MG tablet Take 10 mg by mouth at bedtime.     Multiple Vitamins-Minerals (HAIR SKIN & NAILS ADVANCED PO) Take 1 tablet by mouth daily.     Multiple Vitamins-Minerals (MULTIVITAMIN GUMMIES ADULT) CHEW Chew 2 capsules by mouth daily.     oxyCODONE  (OXY IR/ROXICODONE ) 5 MG immediate release tablet Take 1 tablet (5 mg total) by mouth every 4 (four) hours as needed for moderate pain (pain score 4-6) (pain score 4-6). 30 tablet 0   pantoprazole  (PROTONIX ) 40 MG tablet TAKE 1 TABLET BY MOUTH EVERY DAY 90 tablet 0   propranolol  ER (INDERAL  LA) 60 MG 24 hr capsule Take 1 capsule (60 mg total) by mouth daily. 30 capsule 0   traMADol  (ULTRAM ) 50 MG tablet Take 1-2 tablets (50-100 mg total) by mouth every 4 (four) hours as needed for moderate pain (pain score 4-6). 30 tablet 0   No current facility-administered medications for this visit.   Facility-Administered Medications Ordered in Other Visits  Medication Dose Route Frequency Provider Last Rate Last Admin   0.9 %  sodium chloride  infusion (Manually program via Guardrails IV Fluids)  250 mL Intravenous Continuous Jacobo Evalene PARAS, MD 10 mL/hr at 01/25/24 0919 100 mL at 01/25/24 0919    OBJECTIVE: Vitals:   09/02/24 1153  BP: (!) 117/54  Pulse: 80  Resp: 18  Temp: 97.9 F (36.6 C)  SpO2: 97%     Body mass index is 19.71 kg/m.    ECOG FS:0 - Asymptomatic  General: Well-developed, well-nourished, no acute distress.  Sitting in a wheelchair. Eyes: Pink conjunctiva, anicteric sclera. HEENT: Normocephalic, moist mucous membranes. Lungs: No audible wheezing or coughing. Heart: Regular rate and rhythm. Abdomen: Soft, nontender, no obvious distention. Musculoskeletal: No edema, cyanosis, or clubbing. Neuro: Alert, answering all questions appropriately. Cranial nerves grossly intact. Skin: No rashes or petechiae noted. Psych: Normal affect.  LAB RESULTS:  Lab Results  Component Value Date   NA 136 03/10/2024   K  3.4 (L) 03/10/2024  CL 102 03/10/2024   CO2 24 03/10/2024   GLUCOSE 77 03/10/2024   BUN 12 03/10/2024   CREATININE 1.06 (H) 03/10/2024   CALCIUM  8.5 (L) 03/10/2024   PROT 6.7 02/23/2024   ALBUMIN 3.7 02/23/2024   AST 20 02/23/2024   ALT 11 02/23/2024   ALKPHOS 87 02/23/2024   BILITOT 0.8 02/23/2024   GFRNONAA 53 (L) 03/10/2024   GFRAA >60 09/28/2016    Lab Results  Component Value Date   WBC 11.6 (H) 08/31/2024   NEUTROABS 7.4 08/31/2024   HGB 11.1 (L) 08/31/2024   HCT 36.0 08/31/2024   MCV 87.2 08/31/2024   PLT 645 (H) 08/31/2024   Lab Results  Component Value Date   FERRITIN 44 08/31/2024   Lab Results  Component Value Date   IRON  74 08/31/2024   TIBC 322 08/31/2024   IRONPCTSAT 23 08/31/2024     STUDIES: No results found.   ASSESSMENT: Thrombocytosis, iron  deficiency anemia.  PLAN:    Iron  deficiency anemia: Patient has a mildly reduced hemoglobin of 11.1, but her iron  stores are within normal limits and she is asymptomatic.  She does not require additional IV Venofer  today.  Patient last received treatment on May 03, 2024.  No intervention is needed.  Return to clinic in 4 months with repeat laboratory work, further evaluation, and continuation of treatment if needed.   Thrombocytosis: Chronic and unchanged.  Previously, all of her other blood work including JAK2 mutation with reflex, flow cytometry, BCR-ABL mutation, and IntelliGEN myeloid panel were all either negative or within normal limits. Joint pain: Resolved.  Patient has now undergone her joint replacement surgery.  I spent a total of 20 minutes reviewing chart data, face-to-face evaluation with the patient, counseling and coordination of care as detailed above.    Patient expressed understanding and was in agreement with this plan. She also understands that She can call clinic at any time with any questions, concerns, or complaints.    Evalene JINNY Reusing, MD   09/02/2024 12:04 PM

## 2024-09-02 NOTE — Progress Notes (Signed)
Patient is feeling better.

## 2024-09-06 DIAGNOSIS — M6284 Sarcopenia: Secondary | ICD-10-CM | POA: Diagnosis not present

## 2024-09-06 DIAGNOSIS — I69322 Dysarthria following cerebral infarction: Secondary | ICD-10-CM | POA: Diagnosis not present

## 2024-09-06 DIAGNOSIS — I10 Essential (primary) hypertension: Secondary | ICD-10-CM | POA: Diagnosis not present

## 2024-09-06 DIAGNOSIS — Z96652 Presence of left artificial knee joint: Secondary | ICD-10-CM | POA: Diagnosis not present

## 2024-09-06 DIAGNOSIS — M5030 Other cervical disc degeneration, unspecified cervical region: Secondary | ICD-10-CM | POA: Diagnosis not present

## 2024-09-06 DIAGNOSIS — G8929 Other chronic pain: Secondary | ICD-10-CM | POA: Diagnosis not present

## 2024-09-06 DIAGNOSIS — I69392 Facial weakness following cerebral infarction: Secondary | ICD-10-CM | POA: Diagnosis not present

## 2024-09-06 DIAGNOSIS — F419 Anxiety disorder, unspecified: Secondary | ICD-10-CM | POA: Diagnosis not present

## 2024-09-06 DIAGNOSIS — M4802 Spinal stenosis, cervical region: Secondary | ICD-10-CM | POA: Diagnosis not present

## 2024-09-06 DIAGNOSIS — I69354 Hemiplegia and hemiparesis following cerebral infarction affecting left non-dominant side: Secondary | ICD-10-CM | POA: Diagnosis not present

## 2024-09-09 DIAGNOSIS — I69354 Hemiplegia and hemiparesis following cerebral infarction affecting left non-dominant side: Secondary | ICD-10-CM | POA: Diagnosis not present

## 2024-09-09 DIAGNOSIS — I69392 Facial weakness following cerebral infarction: Secondary | ICD-10-CM | POA: Diagnosis not present

## 2024-09-09 DIAGNOSIS — M5030 Other cervical disc degeneration, unspecified cervical region: Secondary | ICD-10-CM | POA: Diagnosis not present

## 2024-09-09 DIAGNOSIS — F419 Anxiety disorder, unspecified: Secondary | ICD-10-CM | POA: Diagnosis not present

## 2024-09-09 DIAGNOSIS — I69322 Dysarthria following cerebral infarction: Secondary | ICD-10-CM | POA: Diagnosis not present

## 2024-09-09 DIAGNOSIS — G8929 Other chronic pain: Secondary | ICD-10-CM | POA: Diagnosis not present

## 2024-09-09 DIAGNOSIS — M6284 Sarcopenia: Secondary | ICD-10-CM | POA: Diagnosis not present

## 2024-09-09 DIAGNOSIS — M4802 Spinal stenosis, cervical region: Secondary | ICD-10-CM | POA: Diagnosis not present

## 2024-09-16 DIAGNOSIS — Z8673 Personal history of transient ischemic attack (TIA), and cerebral infarction without residual deficits: Secondary | ICD-10-CM | POA: Diagnosis not present

## 2024-09-16 DIAGNOSIS — I10 Essential (primary) hypertension: Secondary | ICD-10-CM | POA: Diagnosis not present

## 2024-09-16 DIAGNOSIS — Z1331 Encounter for screening for depression: Secondary | ICD-10-CM | POA: Diagnosis not present

## 2024-09-16 DIAGNOSIS — Z79899 Other long term (current) drug therapy: Secondary | ICD-10-CM | POA: Diagnosis not present

## 2024-09-16 DIAGNOSIS — Z0000 Encounter for general adult medical examination without abnormal findings: Secondary | ICD-10-CM | POA: Diagnosis not present

## 2024-09-16 DIAGNOSIS — R7303 Prediabetes: Secondary | ICD-10-CM | POA: Diagnosis not present

## 2024-09-16 DIAGNOSIS — R8290 Unspecified abnormal findings in urine: Secondary | ICD-10-CM | POA: Diagnosis not present

## 2024-09-16 DIAGNOSIS — E781 Pure hyperglyceridemia: Secondary | ICD-10-CM | POA: Diagnosis not present

## 2024-09-16 DIAGNOSIS — E039 Hypothyroidism, unspecified: Secondary | ICD-10-CM | POA: Diagnosis not present

## 2025-01-03 ENCOUNTER — Inpatient Hospital Stay: Attending: Oncology

## 2025-01-04 ENCOUNTER — Encounter: Payer: Self-pay | Admitting: Oncology

## 2025-01-04 ENCOUNTER — Inpatient Hospital Stay

## 2025-01-04 ENCOUNTER — Inpatient Hospital Stay: Admitting: Oncology
